# Patient Record
Sex: Female | Born: 1945 | Race: White | Hispanic: No | Marital: Married | Smoking: Former smoker
Health system: Southern US, Community
[De-identification: ages and names within clinical notes are randomized; demographics above are authoritative.]

## PROBLEM LIST (undated history)

## (undated) DIAGNOSIS — E669 Obesity, unspecified: Secondary | ICD-10-CM

## (undated) DIAGNOSIS — K766 Portal hypertension: Secondary | ICD-10-CM

## (undated) DIAGNOSIS — H4020X0 Unspecified primary angle-closure glaucoma, stage unspecified: Secondary | ICD-10-CM

## (undated) DIAGNOSIS — R350 Frequency of micturition: Secondary | ICD-10-CM

## (undated) DIAGNOSIS — I10 Essential (primary) hypertension: Secondary | ICD-10-CM

## (undated) DIAGNOSIS — Z9889 Other specified postprocedural states: Secondary | ICD-10-CM

## (undated) DIAGNOSIS — Z85828 Personal history of other malignant neoplasm of skin: Secondary | ICD-10-CM

## (undated) DIAGNOSIS — R202 Paresthesia of skin: Secondary | ICD-10-CM

## (undated) DIAGNOSIS — Z8489 Family history of other specified conditions: Secondary | ICD-10-CM

## (undated) DIAGNOSIS — Z923 Personal history of irradiation: Secondary | ICD-10-CM

## (undated) DIAGNOSIS — G2581 Restless legs syndrome: Secondary | ICD-10-CM

## (undated) DIAGNOSIS — R112 Nausea with vomiting, unspecified: Secondary | ICD-10-CM

## (undated) DIAGNOSIS — T8859XA Other complications of anesthesia, initial encounter: Secondary | ICD-10-CM

## (undated) DIAGNOSIS — K7460 Unspecified cirrhosis of liver: Secondary | ICD-10-CM

## (undated) DIAGNOSIS — I214 Non-ST elevation (NSTEMI) myocardial infarction: Secondary | ICD-10-CM

## (undated) DIAGNOSIS — C569 Malignant neoplasm of unspecified ovary: Secondary | ICD-10-CM

## (undated) DIAGNOSIS — N3281 Overactive bladder: Secondary | ICD-10-CM

## (undated) DIAGNOSIS — T4145XA Adverse effect of unspecified anesthetic, initial encounter: Secondary | ICD-10-CM

## (undated) DIAGNOSIS — I509 Heart failure, unspecified: Secondary | ICD-10-CM

## (undated) DIAGNOSIS — R945 Abnormal results of liver function studies: Secondary | ICD-10-CM

## (undated) DIAGNOSIS — R7989 Other specified abnormal findings of blood chemistry: Secondary | ICD-10-CM

## (undated) HISTORY — DX: Unspecified cirrhosis of liver: K74.60

## (undated) HISTORY — DX: Abnormal results of liver function studies: R94.5

## (undated) HISTORY — DX: Overactive bladder: N32.81

## (undated) HISTORY — PX: DENTAL SURGERY: SHX609

## (undated) HISTORY — DX: Obesity, unspecified: E66.9

## (undated) HISTORY — DX: Non-ST elevation (NSTEMI) myocardial infarction: I21.4

## (undated) HISTORY — DX: Unspecified primary angle-closure glaucoma, stage unspecified: H40.20X0

## (undated) HISTORY — PX: TUBAL LIGATION: SHX77

## (undated) HISTORY — DX: Essential (primary) hypertension: I10

## (undated) HISTORY — PX: EYE SURGERY: SHX253

## (undated) HISTORY — DX: Other specified abnormal findings of blood chemistry: R79.89

## (undated) HISTORY — DX: Portal hypertension: K76.6

## (undated) HISTORY — DX: Malignant neoplasm of unspecified ovary: C56.9

---

## 1969-02-11 HISTORY — PX: TONSILLECTOMY: SUR1361

## 1986-02-11 HISTORY — PX: PARTIAL HYSTERECTOMY: SHX80

## 1999-03-22 ENCOUNTER — Other Ambulatory Visit: Admission: RE | Admit: 1999-03-22 | Discharge: 1999-03-22 | Payer: Self-pay | Admitting: Obstetrics and Gynecology

## 2000-06-09 ENCOUNTER — Other Ambulatory Visit: Admission: RE | Admit: 2000-06-09 | Discharge: 2000-06-09 | Payer: Self-pay | Admitting: Obstetrics and Gynecology

## 2001-02-11 HISTORY — PX: COSMETIC SURGERY: SHX468

## 2001-03-16 ENCOUNTER — Encounter: Admission: RE | Admit: 2001-03-16 | Discharge: 2001-03-16 | Payer: Self-pay | Admitting: Specialist

## 2001-03-16 ENCOUNTER — Encounter: Payer: Self-pay | Admitting: Specialist

## 2001-08-11 ENCOUNTER — Other Ambulatory Visit: Admission: RE | Admit: 2001-08-11 | Discharge: 2001-08-11 | Payer: Self-pay | Admitting: Obstetrics and Gynecology

## 2002-09-07 ENCOUNTER — Other Ambulatory Visit: Admission: RE | Admit: 2002-09-07 | Discharge: 2002-09-07 | Payer: Self-pay | Admitting: Obstetrics and Gynecology

## 2003-11-18 ENCOUNTER — Emergency Department (HOSPITAL_COMMUNITY): Admission: EM | Admit: 2003-11-18 | Discharge: 2003-11-18 | Payer: Self-pay | Admitting: Family Medicine

## 2005-04-10 ENCOUNTER — Encounter: Admission: RE | Admit: 2005-04-10 | Discharge: 2005-04-10 | Payer: Self-pay | Admitting: Orthopedic Surgery

## 2005-10-18 HISTORY — PX: COLONOSCOPY: SHX174

## 2009-11-24 HISTORY — PX: WH-MAMMOGRAPHY: HXRAD724

## 2010-10-13 DIAGNOSIS — I214 Non-ST elevation (NSTEMI) myocardial infarction: Secondary | ICD-10-CM

## 2010-10-13 HISTORY — PX: OTHER SURGICAL HISTORY: SHX169

## 2010-10-13 HISTORY — DX: Non-ST elevation (NSTEMI) myocardial infarction: I21.4

## 2010-10-24 ENCOUNTER — Inpatient Hospital Stay (HOSPITAL_COMMUNITY)
Admission: EM | Admit: 2010-10-24 | Discharge: 2010-10-27 | DRG: 125 | Disposition: A | Discharge status: Home / Self Care | Payer: BC Managed Care – PPO | Source: Ambulatory Visit | Attending: Cardiovascular Disease | Admitting: Cardiovascular Disease

## 2010-10-24 ENCOUNTER — Emergency Department (HOSPITAL_COMMUNITY): Payer: BC Managed Care – PPO

## 2010-10-24 DIAGNOSIS — J45909 Unspecified asthma, uncomplicated: Secondary | ICD-10-CM | POA: Diagnosis present

## 2010-10-24 DIAGNOSIS — R079 Chest pain, unspecified: Secondary | ICD-10-CM

## 2010-10-24 DIAGNOSIS — K219 Gastro-esophageal reflux disease without esophagitis: Secondary | ICD-10-CM | POA: Diagnosis present

## 2010-10-24 DIAGNOSIS — Z7982 Long term (current) use of aspirin: Secondary | ICD-10-CM

## 2010-10-24 DIAGNOSIS — R0789 Other chest pain: Principal | ICD-10-CM | POA: Diagnosis present

## 2010-10-24 DIAGNOSIS — R799 Abnormal finding of blood chemistry, unspecified: Secondary | ICD-10-CM | POA: Diagnosis present

## 2010-10-24 DIAGNOSIS — I10 Essential (primary) hypertension: Secondary | ICD-10-CM | POA: Diagnosis present

## 2010-10-24 DIAGNOSIS — N318 Other neuromuscular dysfunction of bladder: Secondary | ICD-10-CM | POA: Diagnosis present

## 2010-10-24 DIAGNOSIS — E119 Type 2 diabetes mellitus without complications: Secondary | ICD-10-CM | POA: Diagnosis present

## 2010-10-24 LAB — CBC
HCT: 39.2 % (ref 36.0–46.0)
HCT: 37.3 % (ref 36.0–46.0)
Hemoglobin: 13.7 g/dL (ref 12.0–15.0)
Hemoglobin: 12.9 g/dL (ref 12.0–15.0)
MCH: 31.5 pg (ref 26.0–34.0)
MCH: 31 pg (ref 26.0–34.0)
MCHC: 34.9 g/dL (ref 30.0–36.0)
MCHC: 34.6 g/dL (ref 30.0–36.0)
MCV: 90.1 fL (ref 78.0–100.0)
MCV: 89.7 fL (ref 78.0–100.0)
Platelets: 164 10*3/uL (ref 150–400)
Platelets: 161 10*3/uL (ref 150–400)
RBC: 4.35 MIL/uL (ref 3.87–5.11)
RBC: 4.16 MIL/uL (ref 3.87–5.11)
RDW: 14.6 % (ref 11.5–15.5)
RDW: 14.8 % (ref 11.5–15.5)
WBC: 8.4 10*3/uL (ref 4.0–10.5)
WBC: 9.8 10*3/uL (ref 4.0–10.5)

## 2010-10-24 LAB — CK TOTAL AND CKMB (NOT AT ARMC)
CK, MB: 3 ng/mL (ref 0.3–4.0)
Relative Index: INVALID (ref 0.0–2.5)
Total CK: 61 U/L (ref 7–177)

## 2010-10-24 LAB — POCT I-STAT, CHEM 8
BUN: 13 mg/dL (ref 6–23)
Calcium, Ion: 1.18 mmol/L (ref 1.12–1.32)
Chloride: 105 mEq/L (ref 96–112)
Creatinine, Ser: 0.7 mg/dL (ref 0.50–1.10)
Glucose, Bld: 127 mg/dL — ABNORMAL HIGH (ref 70–99)
HCT: 43 % (ref 36.0–46.0)
Hemoglobin: 14.6 g/dL (ref 12.0–15.0)
Potassium: 3.8 mEq/L (ref 3.5–5.1)
Sodium: 143 mEq/L (ref 135–145)
TCO2: 24 mmol/L (ref 0–100)

## 2010-10-24 LAB — HEPATIC FUNCTION PANEL
ALT: 170 U/L — ABNORMAL HIGH (ref 0–35)
AST: 112 U/L — ABNORMAL HIGH (ref 0–37)
Albumin: 3.9 g/dL (ref 3.5–5.2)
Alkaline Phosphatase: 122 U/L — ABNORMAL HIGH (ref 39–117)
Bilirubin, Direct: 0.2 mg/dL (ref 0.0–0.3)
Indirect Bilirubin: 0.3 mg/dL (ref 0.3–0.9)
Total Bilirubin: 0.5 mg/dL (ref 0.3–1.2)
Total Protein: 7.5 g/dL (ref 6.0–8.3)

## 2010-10-24 LAB — DIFFERENTIAL
Basophils Absolute: 0 10*3/uL (ref 0.0–0.1)
Basophils Relative: 0 % (ref 0–1)
Eosinophils Absolute: 0.1 10*3/uL (ref 0.0–0.7)
Eosinophils Relative: 1 % (ref 0–5)
Lymphocytes Relative: 20 % (ref 12–46)
Lymphs Abs: 1.6 10*3/uL (ref 0.7–4.0)
Monocytes Absolute: 0.5 10*3/uL (ref 0.1–1.0)
Monocytes Relative: 6 % (ref 3–12)
Neutro Abs: 6.1 10*3/uL (ref 1.7–7.7)
Neutrophils Relative %: 73 % (ref 43–77)

## 2010-10-24 LAB — CARDIAC PANEL(CRET KIN+CKTOT+MB+TROPI)
CK, MB: 4.2 ng/mL — ABNORMAL HIGH (ref 0.3–4.0)
Relative Index: INVALID (ref 0.0–2.5)
Total CK: 61 U/L (ref 7–177)
Troponin I: 0.89 ng/mL (ref <0.30)

## 2010-10-24 LAB — MRSA PCR SCREENING: MRSA by PCR: NEGATIVE

## 2010-10-24 LAB — HEPARIN LEVEL (UNFRACTIONATED): Heparin Unfractionated: 0.21 IU/mL — ABNORMAL LOW (ref 0.30–0.70)

## 2010-10-24 LAB — POCT I-STAT TROPONIN I: Troponin i, poc: 0.24 ng/mL (ref 0.00–0.08)

## 2010-10-24 LAB — GLUCOSE, CAPILLARY: Glucose-Capillary: 135 mg/dL — ABNORMAL HIGH (ref 70–99)

## 2010-10-24 LAB — PROTIME-INR
INR: 1.12 (ref 0.00–1.49)
Prothrombin Time: 14.6 seconds (ref 11.6–15.2)

## 2010-10-24 LAB — APTT: aPTT: 34 seconds (ref 24–37)

## 2010-10-24 LAB — TROPONIN I: Troponin I: 0.47 ng/mL (ref <0.30)

## 2010-10-25 HISTORY — PX: CARDIAC CATHETERIZATION: SHX172

## 2010-10-25 LAB — COMPREHENSIVE METABOLIC PANEL
ALT: 165 U/L — ABNORMAL HIGH (ref 0–35)
AST: 123 U/L — ABNORMAL HIGH (ref 0–37)
Albumin: 3.5 g/dL (ref 3.5–5.2)
Alkaline Phosphatase: 100 U/L (ref 39–117)
BUN: 12 mg/dL (ref 6–23)
CO2: 26 mEq/L (ref 19–32)
Calcium: 9.4 mg/dL (ref 8.4–10.5)
Chloride: 106 mEq/L (ref 96–112)
Creatinine, Ser: 0.73 mg/dL (ref 0.50–1.10)
GFR calc Af Amer: >60 mL/min (ref >60)
GFR calc non Af Amer: >60 mL/min (ref >60)
Glucose, Bld: 146 mg/dL — ABNORMAL HIGH (ref 70–99)
Potassium: 4.2 mEq/L (ref 3.5–5.1)
Sodium: 142 mEq/L (ref 135–145)
Total Bilirubin: 0.5 mg/dL (ref 0.3–1.2)
Total Protein: 6.9 g/dL (ref 6.0–8.3)

## 2010-10-25 LAB — D-DIMER, QUANTITATIVE (NOT AT ARMC): D-Dimer, Quant: <0.22 ug/mL-FEU (ref 0.00–0.48)

## 2010-10-25 LAB — GLUCOSE, CAPILLARY
Glucose-Capillary: 131 mg/dL — ABNORMAL HIGH (ref 70–99)
Glucose-Capillary: 189 mg/dL — ABNORMAL HIGH (ref 70–99)
Glucose-Capillary: 197 mg/dL — ABNORMAL HIGH (ref 70–99)
Glucose-Capillary: 198 mg/dL — ABNORMAL HIGH (ref 70–99)

## 2010-10-25 LAB — CBC
HCT: 38.1 % (ref 36.0–46.0)
Hemoglobin: 12.9 g/dL (ref 12.0–15.0)
MCH: 30.8 pg (ref 26.0–34.0)
MCHC: 33.9 g/dL (ref 30.0–36.0)
MCV: 90.9 fL (ref 78.0–100.0)
Platelets: 153 10*3/uL (ref 150–400)
RBC: 4.19 MIL/uL (ref 3.87–5.11)
RDW: 15.1 % (ref 11.5–15.5)
WBC: 7.7 10*3/uL (ref 4.0–10.5)

## 2010-10-25 LAB — LIPID PANEL
Cholesterol: 145 mg/dL (ref 0–200)
HDL: 43 mg/dL (ref >39)
LDL Cholesterol: 75 mg/dL (ref 0–99)
Total CHOL/HDL Ratio: 3.4 RATIO
Triglycerides: 135 mg/dL (ref <150)
VLDL: 27 mg/dL (ref 0–40)

## 2010-10-25 LAB — TSH: TSH: 3.432 u[IU]/mL (ref 0.350–4.500)

## 2010-10-25 LAB — POCT ACTIVATED CLOTTING TIME: Activated Clotting Time: 138 seconds

## 2010-10-25 LAB — CARDIAC PANEL(CRET KIN+CKTOT+MB+TROPI)
CK, MB: 3.9 ng/mL (ref 0.3–4.0)
Relative Index: INVALID (ref 0.0–2.5)
Total CK: 55 U/L (ref 7–177)
Troponin I: 0.3 ng/mL (ref <0.30)

## 2010-10-25 LAB — HEPARIN LEVEL (UNFRACTIONATED): Heparin Unfractionated: 0.27 IU/mL — ABNORMAL LOW (ref 0.30–0.70)

## 2010-10-26 ENCOUNTER — Inpatient Hospital Stay (HOSPITAL_COMMUNITY): Payer: BC Managed Care – PPO

## 2010-10-26 LAB — CBC
HCT: 40.3 % (ref 36.0–46.0)
Hemoglobin: 13.4 g/dL (ref 12.0–15.0)
MCH: 30.5 pg (ref 26.0–34.0)
MCHC: 33.3 g/dL (ref 30.0–36.0)
MCV: 91.6 fL (ref 78.0–100.0)
Platelets: 188 10*3/uL (ref 150–400)
RBC: 4.4 MIL/uL (ref 3.87–5.11)
RDW: 15.1 % (ref 11.5–15.5)
WBC: 10.2 10*3/uL (ref 4.0–10.5)

## 2010-10-26 LAB — BASIC METABOLIC PANEL
BUN: 15 mg/dL (ref 6–23)
CO2: 28 mEq/L (ref 19–32)
Calcium: 9.7 mg/dL (ref 8.4–10.5)
Chloride: 107 mEq/L (ref 96–112)
Creatinine, Ser: 0.75 mg/dL (ref 0.50–1.10)
GFR calc Af Amer: >60 mL/min (ref >60)
GFR calc non Af Amer: >60 mL/min (ref >60)
Glucose, Bld: 118 mg/dL — ABNORMAL HIGH (ref 70–99)
Potassium: 3.8 mEq/L (ref 3.5–5.1)
Sodium: 143 mEq/L (ref 135–145)

## 2010-10-26 LAB — GLUCOSE, CAPILLARY
Glucose-Capillary: 127 mg/dL — ABNORMAL HIGH (ref 70–99)
Glucose-Capillary: 111 mg/dL — ABNORMAL HIGH (ref 70–99)
Glucose-Capillary: 218 mg/dL — ABNORMAL HIGH (ref 70–99)
Glucose-Capillary: 113 mg/dL — ABNORMAL HIGH (ref 70–99)

## 2010-10-26 LAB — CARDIAC PANEL(CRET KIN+CKTOT+MB+TROPI)
CK, MB: 2.4 ng/mL (ref 0.3–4.0)
Relative Index: INVALID (ref 0.0–2.5)
Total CK: 68 U/L (ref 7–177)
Troponin I: <0.3 ng/mL (ref <0.30)

## 2010-10-27 LAB — CBC
HCT: 38.2 % (ref 36.0–46.0)
Hemoglobin: 12.8 g/dL (ref 12.0–15.0)
MCH: 30.7 pg (ref 26.0–34.0)
MCHC: 33.5 g/dL (ref 30.0–36.0)
MCV: 91.6 fL (ref 78.0–100.0)
Platelets: 156 10*3/uL (ref 150–400)
RBC: 4.17 MIL/uL (ref 3.87–5.11)
RDW: 15.2 % (ref 11.5–15.5)
WBC: 7 10*3/uL (ref 4.0–10.5)

## 2010-10-27 LAB — HEPATIC FUNCTION PANEL
ALT: 179 U/L — ABNORMAL HIGH (ref 0–35)
AST: 113 U/L — ABNORMAL HIGH (ref 0–37)
Albumin: 3.4 g/dL — ABNORMAL LOW (ref 3.5–5.2)
Alkaline Phosphatase: 97 U/L (ref 39–117)
Bilirubin, Direct: 0.1 mg/dL (ref 0.0–0.3)
Indirect Bilirubin: 0.3 mg/dL (ref 0.3–0.9)
Total Bilirubin: 0.4 mg/dL (ref 0.3–1.2)
Total Protein: 6.7 g/dL (ref 6.0–8.3)

## 2010-10-27 LAB — GLUCOSE, CAPILLARY: Glucose-Capillary: 134 mg/dL — ABNORMAL HIGH (ref 70–99)

## 2010-10-29 NOTE — H&P (Addendum)
NAMELynnell Benson NO.:  [id number]  MEDICAL RECORD NO.:  [id number]  LOCATION:  2918                         FACILITY:  [facility]  PHYSICIAN:  Thayer Headings, M.D. DATE OF BIRTH:  Mar 26, 1945  DATE OF ADMISSION:  10/24/2010 DATE OF DISCHARGE:                             HISTORY & PHYSICAL   PRIMARY CARDIOLOGIST:  New to [facility].  CHIEF COMPLAINT:  Chest pain in the setting of an NSTEMI.  HISTORY OF PRESENT ILLNESS:  [name] is a 65 year old female with no prior cardiac history, but history of diabetes and asthma, who presented to [facility] with complaints of chest pain.  While taking the clothes out of the dryer today, she developed substernal chest pressure like an elephant sitting on her chest radiating to her jaw and both arms, associated with diaphoresis and nausea.  She sat down and took as aspirin, but with little relief.  She called her sister who advised herto go to [facility], and from there, she was referred to the ER.  She received five sublingual nitroglycerins and totalled with a third one really helping to decrease her pain.  She feels much better than when she came in, and states that she does have a soft residual soreness substernally which is different than her original symptoms.  Initial point-of-care troponin is 0.24 with next full set showing a troponin of 0.47 with negative CKs and MBs.  She has been initiated on IV heparin and has been started on nitroglycerin drip by the ER.  EKG shows normal sinus rhythm without significant ST-T changes including T-wave inversion in aVL.  PAST MEDICAL HISTORY: 1. Diabetes mellitus diagnosed in July 2012. 2. Overactive bladder. 3. Asthma, not requiring any long term maintenance therapy.  SURGICAL HISTORY:  Hysterectomy and tonsillectomy.  MEDICATIONS:  The patient is not entirely sure from med list, but now she takes metformin 500 mg b.i.d., losartan 25 mg daily,  Protonix, uncertain dose, Detrol, uncertain dose and she only takes this sporadically, and an albuterol inhaler once in awhile and Cenestin hormones, although she does not know what they contained.  ALLERGIES:  PENICILLIN caused a rash.  SOCIAL HISTORY:  [name] lives in [place].  She is single and has no living children.  She quit smoking 30 years ago, and otherwise smoked for about 2 years.  She drinks one beer week and denies any illicit drug use.  She does not work.  FAMILY HISTORY:  Mother is living at 41 with diabetes and asthma. Father died at 97 of bladder cancer and also had a CVA at age 62 with both hypertension.  There are six siblings in total, two of her sisters are accompanying her today during the interview of her consent.  There is a history of hypertension in her siblings, but no coronary artery disease.  REVIEW OF SYSTEMS:  No fevers, chills, vomiting, diarrhea, bright red blood per rectum, melena, hematemesis.  No hematuria.  She does have occasional low-grade dyspnea on exertion, which she relates to asthma sporadically.  However, she has mostly been able to do her activities of daily living without any interference including walking up the hill probably  may lax to her health.  All other were systems reviewed and otherwise negative.  LABORATORY LATA:  WBC 8.4, hemoglobin 14.6, hematocrit 43, platelet count 164.  Sodium 143, potassium 3.8, chloride 105, glucose 127, BUN 13, creatinine 0.7.  First point-of-care troponin was 0.24.  Next set of cardiac markers showed CK of 61, MB of 3, and troponin of 0.47.  RADIOLOGIC STUDIES:  Chest x-ray showed bibasilar linear scarring without active lung disease.  PHYSICAL EXAMINATION:  VITAL SIGNS:  Temperature 97.3, pulse 75, respirations 10, blood pressure 110/50, pulse ox 98% on room air. GENERAL:  This is an anxious-appearing white female in no acute distress. HEENT:  Normocephalic and atraumatic with extraocular  movements intact. Clear sclerae.  Nares are without discharge. NECK:  Supple without carotid bruits or JVD. HEART:  Auscultation of the heart reveals regular rate and rhythm with S1, S2 without murmurs, rubs, or gallops. LUNGS:  Clear to auscultation bilaterally without wheezes, rales or rhonchi. ABDOMEN:  Soft, nontender, and nondistended with positive bowel sounds. EXTREMITIES:  Warm and dry without edema.  She has 1+ pedal pulses bilaterally. NEUROLOGIC:  She is alert and oriented x3.  Responds to question appropriately with an unhappy-appearing mood.  ASSESSMENT AND PLAN:  The patient was seen and examined by Dr. Acie Fredrickson and myself.  This is a 65 year old female with no prior cardiac history, but a history of diabetes and asthma who presents with chest pain worrisome for acute coronary syndrome who had ruled in for an non-ST segment elevation myocardial infarction.  EKG shows nonspecific ST-T changes, the patient's pain has been abated with nitroglycerin.  We agree with the ER starting a nitroglycerin drip and IV heparin.  We will add aspirin daily.  She already took that at home today.  On followup labs, her LFTs are somewhat elevated, which may be related to her myocardial infarction, we will follow.  Now, we will add a statin, but overall LFTs remain elevated, this will need to be held.  We will add low-dose metoprolol given her asthma, and cold for any active wheezing which she has none of which now.  She will be set up for cardiac catheterization tomorrow to assess her coronary anatomy.  We will hold her metformin and continue her ACE inhibitor as well as sliding-scale insulin.  The plan was discussed with the patient and her family who are in agreement.     Melina Copa, P.A.C.   ______________________________ Thayer Headings, M.D.    DD/MEDQ  D:  10/24/2010  T:  10/25/2010  Job:  [id number]  Electronically Signed by Mertie Moores M.D. on 10/29/2010 05:36:36  AM Electronically Signed by Melina Copa  on 11/08/2010 07:04:12 PM

## 2010-11-01 ENCOUNTER — Telehealth: Payer: Self-pay | Admitting: Cardiovascular Disease

## 2010-11-01 ENCOUNTER — Telehealth: Payer: Self-pay | Admitting: *Deleted

## 2010-11-01 NOTE — Telephone Encounter (Signed)
Patient calling back speak with nurse following her d.c from hosptial.

## 2010-11-01 NOTE — Telephone Encounter (Signed)
Appointment scheduled by [facility]

## 2010-11-01 NOTE — Telephone Encounter (Signed)
Post hosp establish, pt doing well after LHC, made app in one week and to call if any questions or concerns

## 2010-11-06 ENCOUNTER — Ambulatory Visit (INDEPENDENT_AMBULATORY_CARE_PROVIDER_SITE_OTHER): Payer: BC Managed Care – PPO | Admitting: Nurse Practitioner

## 2010-11-06 ENCOUNTER — Encounter: Payer: Self-pay | Admitting: Nurse Practitioner

## 2010-11-06 VITALS — BP 136/76 | HR 64 | Ht 62.0 in | Wt 194.2 lb

## 2010-11-06 DIAGNOSIS — R7989 Other specified abnormal findings of blood chemistry: Secondary | ICD-10-CM

## 2010-11-06 DIAGNOSIS — Z9889 Other specified postprocedural states: Secondary | ICD-10-CM | POA: Insufficient documentation

## 2010-11-06 LAB — HEPATIC FUNCTION PANEL
ALT: 131 U/L — ABNORMAL HIGH (ref 0–35)
AST: 83 U/L — ABNORMAL HIGH (ref 0–37)
Albumin: 4.2 g/dL (ref 3.5–5.2)
Alkaline Phosphatase: 134 U/L — ABNORMAL HIGH (ref 39–117)
Bilirubin, Direct: 0.1 mg/dL (ref 0.0–0.3)
Total Bilirubin: 0.6 mg/dL (ref 0.3–1.2)
Total Protein: 7.8 g/dL (ref 6.0–8.3)

## 2010-11-06 LAB — BASIC METABOLIC PANEL
BUN: 13 mg/dL (ref 6–23)
CO2: 27 mEq/L (ref 19–32)
Calcium: 9.9 mg/dL (ref 8.4–10.5)
Chloride: 107 mEq/L (ref 96–112)
Creatinine, Ser: 0.8 mg/dL (ref 0.4–1.2)
GFR: 72.76 mL/min (ref >60.00)
Glucose, Bld: 118 mg/dL — ABNORMAL HIGH (ref 70–99)
Potassium: 5.3 mEq/L — ABNORMAL HIGH (ref 3.5–5.1)
Sodium: 144 mEq/L (ref 135–145)

## 2010-11-06 NOTE — Assessment & Plan Note (Signed)
She is now doing well. Her story of chest pain is very concerning. Fortunately, her coronaries are normal. She has had no recurrence of symptoms. We discussed need for exercise and weight loss today. We will recheck a CMET today. Further management per her PCP. We will be available if problems arise in the future. Patient is agreeable to this plan and will call if any problems develop in the interim.

## 2010-11-06 NOTE — Progress Notes (Signed)
    Olivia Benson Date of Birth: 04/21/1945   History of Present Illness: Olivia Benson is seen today for a post hospital visit. She had recent episode of chest pain with radiation into the neck and jaw. Her cath was normal. LFTs were elevated and need recheck today. Abdominal ultrasound was normal except for a fatty liver. She has had no recurrence. She feels well. No problem with her groin. She does not exercise and is overweight which we discussed in detail today.   Current Outpatient Prescriptions on File Prior to Visit  Medication Sig Dispense Refill  . albuterol (PROVENTIL HFA;VENTOLIN HFA) 108 (90 BASE) MCG/ACT inhaler Inhale 2 puffs into the lungs every 4 (four) hours as needed.        Marland Kitchen aspirin 81 MG tablet Take 81 mg by mouth daily.        Marland Kitchen estrogens conjugated, synthetic A, (CENESTIN) 0.625 MG tablet Take 0.625 mg by mouth daily.        Marland Kitchen losartan (COZAAR) 25 MG tablet Take 25 mg by mouth daily.        . metFORMIN (GLUCOPHAGE) 500 MG tablet Take 500 mg by mouth 2 (two) times daily with a meal.        . metoprolol succinate (TOPROL-XL) 25 MG 24 hr tablet Take 25 mg by mouth daily.        . pantoprazole (PROTONIX) 40 MG tablet Take 40 mg by mouth daily.          Allergies  Allergen Reactions  . Penicillins Rash    Past Medical History  Diagnosis Date  . Diabetes mellitus   . Asthma   . Overactive bladder   . Non-ST elevated myocardial infarction (non-STEMI) Sept 2012    Mild elevation in troponin and CK MB but no evidence of coronary disease at time of cath  . Elevated LFTs   . Obesity     Past Surgical History  Procedure Date  . Tonsillectomy   . Abdominal ultrasound Sept 2012    No gallbladder disease, + fatty liver  . Cardiac catheterization 10/25/2010    EF 60%; Normal coronaries  . Colonoscopy 10/18/2005  . Wh-mammography 11/24/2009    History  Smoking status  . Former Smoker  . Quit date: 11/05/1980  Smokeless tobacco  . Not on file    History  Alcohol Use No      Family History  Problem Relation Age of Onset  . Asthma Mother   . Diabetes Mother   . Hypertension Mother   . Cancer Father   . Stroke Father   . Hypertension Father     Review of Systems: The review of systems is as above.  All other systems were reviewed and are negative.  Physical Exam: BP 136/76  Pulse 64  Ht 5' 2"  (1.575 m)  Wt 194 lb 3.2 oz (88.089 kg)  BMI 35.52 kg/m2 Patient is very pleasant and in no acute distress. She is obese. Skin is warm and dry. Color is normal.  HEENT is unremarkable. Normocephalic/atraumatic. PERRL. Sclera are nonicteric. Neck is supple. No masses. No JVD. Lungs are clear. Cardiac exam shows a regular rate and rhythm. Abdomen is obese but soft. Extremities are without edema. Gait and ROM are intact. No gross neurologic deficits noted.  LABORATORY DATA:   Assessment / Plan:

## 2010-11-06 NOTE — Patient Instructions (Signed)
Exercise and weight loss are encouraged. We will be available as needed. We are going to recheck your liver tests.

## 2010-11-21 NOTE — Discharge Summary (Signed)
NAMELynnell Benson NO.:  [id number]  MEDICAL RECORD NO.:  [id number]  LOCATION:  2025                         FACILITY:  [facility]  PHYSICIAN:  Darlin Coco, M.D. DATE OF BIRTH:  03/10/1945  DATE OF ADMISSION:  10/24/2010 DATE OF DISCHARGE:  10/27/2010                              DISCHARGE SUMMARY   PROCEDURES: 1. Cardiac catheterization. 2. Coronary arteriogram. 3. Left ventriculogram. 4. Ultrasound of the abdomen, complete. 5. Chest x-ray.  PRIMARY FINAL DISCHARGE DIAGNOSIS:  Chest pain with an elevation in troponin to 0.89 and a CK-MB to 4.2, but no significant coronary artery disease on cath.  SECONDARY DIAGNOSES: 1. Diabetes. 2. Asthma. 3. History of overactive bladder. 4. Status post hysterectomy and tonsillectomy. 5. Status post abdominal ultrasound showing normal gallbladder and     likely fatty infiltration of her liver.  TIME AT DISCHARGE:  34 minutes.  HOSPITAL COURSE:  [name] is a 65 year old female with cardiac risk factors, but no history of coronary artery disease.  She had chest pain and came to the hospital where she was admitted for further evaluation and treatment.  She had some elevation in her cardiac enzymes.  She was taken to the cath lab because of concern for non-ST segment elevation MI.  She had no significant coronary artery disease and her EF was normal.  A D-dimer was checked and was within normal limits at less than 0.22.  An ultrasound of the abdomen was done and as described above.  Her enzymes trended down and normalized.  A lipid profile showed an HDL of 43 and an LDL of 75.  A TSH was within normal limits as well.  She had elevation in her liver function tests with an initial alkaline phosphatase of 122 that normalized.  Her SGOT was elevated at 112 with an SGPT of 170. These had no significant change during her hospital stay.  Her albumin was minimally low at 3.4, but her bilirubin was within normal  limits. On October 27, 2010, Olivia Benson was ambulating without chest pain or shortness of breath.  She was evaluated by Dr. Mare Ferrari and considered stable for discharge, to follow up as an outpatient.  DISCHARGE INSTRUCTIONS:  Her activity level is to be increased gradually with no driving for 2 days and no lifting or sexual activity for a week. She is encouraged to stick to a low-sodium diabetic diet.  She is to follow up with Dr. Acie Fredrickson and our office will call her.  She should get a CMET at that appointment to follow up her LFTs.  She is to follow up with primary care as well.  DISCHARGE MEDICATIONS: 1. Losartan 25 mg nightly. 2. Toprol-XL 25 mg daily. 3. Metformin 500 mg b.i.d., hold for 2 days and restart on October 29, 2010. 4. Aspirin 81 mg a day. 5. Protonix 40 mg nightly. 6. Albuterol 90 mcg q.4 h p.r.n. 7. Cenestin estrogen 0.65 mg nightly.     Rosaria Ferries, PA-C   ______________________________ Darlin Coco, M.D.    RB/MEDQ  D:  10/27/2010  T:  10/27/2010  Job:  [id number]  Electronically Signed by Rosaria Ferries PA-C on  11/19/2010 06:36:04 AM Electronically Signed by Darlin Coco M.D. on 11/21/2010 09:00:42 AM

## 2010-11-22 NOTE — Cardiovascular Report (Signed)
NAMELynnell Dike NO.:  [id number]  MEDICAL RECORD NO.:  [id number]  LOCATION:  2918                         FACILITY:  [facility]  PHYSICIAN:  Loretha Brasil. Lia Foyer, MD, FACCDATE OF BIRTH:  November 18, 1945  DATE OF PROCEDURE:  10/25/2010 DATE OF DISCHARGE:                           CARDIAC CATHETERIZATION   INDICATIONS:  [name] is a 65 year old who presents with some chest pain.  In addition, the patient had a borderline positive troponin. They were not definitely electrocardiographic abnormalities, as a result, the current study was done to assess her coronary anatomy.  She does have elevated liver functions, although she says that this has been in the past.  Risks, benefits and alternatives were discussed and the patient consented to proceed.  PROCEDURE: 1. Left heart catheterization. 2. Selective coronary arteriography. 3. Selective left ventriculography.  DESCRIPTION OF PROCEDURE:  The procedure was performed from the right femoral artery using 4-French catheters.  A smart needle was used to gain access.  She tolerated the procedure without complication was taken to the holding area in satisfactory clinical condition.  HEMODYNAMIC DATA: 1. The central aortic pressure is 141/79 with a mean of 107. 2. Left ventricular pressure 140/20. 3. There was no gradient or pullback across the aortic valve.  ANGIOGRAPHIC DATA: 1. Ventriculography done in the RAO projection revealed vigorous     global systolic function.  No segmental abnormalities or     contraction were identified. 2. The left main is free of critical disease. 3. The left anterior descending artery coursed to the apex.  In     looking at the films carefully, the LAD is a large-caliber vessel     that is fairly smooth throughout all the way to the apex where it     divides into two branches.  There is a small first diagonal, a     moderate-sized second diagonal, and a moderate-sized third  diagonal.  All these vessels appear to be without critical     narrowing. 4. The circumflex provides a marginal branch proximally and then a     second marginal branch both of which appear without critical     narrowing.  There maybe some mild luminal irregularities in the     small AV branch, but this does not appear to be high-grade by any     means. 5. The right coronary artery is also a large-caliber vessel that     appears free of critical disease.  There is a posterior descending     and AV nodal artery, then two large posterolateral branches both of     which are free of disease.  CONCLUSIONS: 1. Well-preserved left ventricular function with ejection fraction in     excess of 60%. 2. No significant areas of high-grade focal obstruction noted.  DISPOSITION:  At the present time, she will be treated medically with a marked elevation of liver functions.  We will do a gallbladder ultrasound, and also ultrasound of the liver to determine if she has a fatty liver.  A D-dimer will be obtained as well to exclude the likelihood of pulmonary emboli.     Loretha Brasil. Lia Foyer, MD,  [facility]     TDS/MEDQ  D:  10/25/2010  T:  10/25/2010  Job:  [id number]  cc:   CV Laboratory  Electronically Signed by Bing Quarry MD [facility] on 11/22/2010 05:42:58 AM

## 2011-08-28 ENCOUNTER — Encounter: Payer: Self-pay | Admitting: Gastroenterology

## 2011-09-04 ENCOUNTER — Other Ambulatory Visit (INDEPENDENT_AMBULATORY_CARE_PROVIDER_SITE_OTHER): Payer: BC Managed Care – PPO

## 2011-09-04 ENCOUNTER — Ambulatory Visit (INDEPENDENT_AMBULATORY_CARE_PROVIDER_SITE_OTHER): Payer: BC Managed Care – PPO | Admitting: Gastroenterology

## 2011-09-04 ENCOUNTER — Encounter: Payer: Self-pay | Admitting: Gastroenterology

## 2011-09-04 VITALS — BP 118/64 | HR 64 | Ht 62.0 in | Wt 195.4 lb

## 2011-09-04 DIAGNOSIS — K7689 Other specified diseases of liver: Secondary | ICD-10-CM

## 2011-09-04 DIAGNOSIS — R933 Abnormal findings on diagnostic imaging of other parts of digestive tract: Secondary | ICD-10-CM

## 2011-09-04 DIAGNOSIS — R7401 Elevation of levels of liver transaminase levels: Secondary | ICD-10-CM

## 2011-09-04 DIAGNOSIS — R7402 Elevation of levels of lactic acid dehydrogenase (LDH): Secondary | ICD-10-CM

## 2011-09-04 DIAGNOSIS — K760 Fatty (change of) liver, not elsewhere classified: Secondary | ICD-10-CM | POA: Insufficient documentation

## 2011-09-04 LAB — PROTIME-INR
INR: 1.2 ratio — ABNORMAL HIGH (ref 0.8–1.0)
Prothrombin Time: 12.9 s — ABNORMAL HIGH (ref 10.2–12.4)

## 2011-09-04 LAB — HEPATIC FUNCTION PANEL
ALT: 123 U/L — ABNORMAL HIGH (ref 0–35)
AST: 89 U/L — ABNORMAL HIGH (ref 0–37)
Albumin: 4.2 g/dL (ref 3.5–5.2)
Alkaline Phosphatase: 99 U/L (ref 39–117)
Bilirubin, Direct: 0.1 mg/dL (ref 0.0–0.3)
Total Bilirubin: 0.7 mg/dL (ref 0.3–1.2)
Total Protein: 7.9 g/dL (ref 6.0–8.3)

## 2011-09-04 LAB — BUN: BUN: 12 mg/dL (ref 6–23)

## 2011-09-04 NOTE — Progress Notes (Signed)
History of Present Illness: This is a 66 year old female elevated transaminases for the past several years. Her transaminases have been elevated for the past year ranging from an AST of 58 to 121 and ALT of 68 to 174 over the past year. Hepatitis A, B, and C serologies negative and ferritin was normal. She states that she started gaining weight about 8 or 9 years ago that she has been unable to lose weight long-term. She also has diabetes mellitus which has not been adequately controlled in the past. She states her recent hemoglobin A1c was 6.9. She drinks alcohol on only rare occasions. Does not relate any prior history of heavy alcohol usage. There is no prior history of jaundice or hepatitis. Abdominal ultrasound performed in September 2012 showed the following:  No evidence of cholelithiasis or cholecystitis.  Probable fatty infiltration of liver as above, though cirrhosis not  completely excluded with this appearance.  She has no gastrointestinal complaints. She states she underwent screening colonoscopy by Dr. Collene Mares 2007 that showed mild diverticulosis. The procedure was apparently difficult, possibly due to adhesions and she had abdominal discomfort following the procedure. She is reluctant to schedule another colonoscopy because of this. Denies weight loss, abdominal pain, constipation, diarrhea, change in stool caliber, melena, hematochezia, nausea, vomiting, dysphagia, reflux symptoms, chest pain.  Review of Systems: Pertinent positive and negative review of systems were noted in the above HPI section. All other review of systems were otherwise negative.  Current Medications, Allergies, Past Medical History, Past Surgical History, Family History and Social History were reviewed in [employer] record.  Physical Exam: General: Well developed , well nourished, no acute distress Head: Normocephalic and atraumatic Eyes:  sclerae anicteric, EOMI Ears: Normal auditory  acuity Mouth: No deformity or lesions Neck: Supple, no masses or thyromegaly Lungs: Clear throughout to auscultation Heart: Regular rate and rhythm; no murmurs, rubs or bruits Abdomen: Soft, non tender and non distended. No masses, hepatosplenomegaly or hernias noted. Normal Bowel sounds Musculoskeletal: Symmetrical with no gross deformities  Skin: No lesions on visible extremities Pulses:  Normal pulses noted Extremities: No clubbing, cyanosis, edema or deformities noted Neurological: Alert oriented x 4, grossly nonfocal Cervical Nodes:  No significant cervical adenopathy Inguinal Nodes: No significant inguinal adenopathy Psychological:  Alert and cooperative. Normal mood and affect  Assessment and Recommendations:  1. Elevated transaminases and abnormal hepatic imaging. She has fatty infiltration of the liver related to obesity and diabetes. Obtain serologies to rule out other liver diseases. CT scan of the abdomen to evaluate the abnormal hepatic contour noted on ultrasound that questioned early cirrhotic changes. Consider liver biopsy. She is advised to maintain a long-term low fat diet and weight loss program with close control of her diabetes mellitus all supervised by her primary physician. It is reasonable to monitor her LFTs every 6 months.  2. Colorectal cancer screening, average risk. Colonoscopy recommended 2017. Will request records from Dr. Collene Mares.

## 2011-09-04 NOTE — Patient Instructions (Signed)
Your physician has requested that you go to the basement for the following lab work before leaving today: You have been scheduled for a CT scan of the abdomen and pelvis at [facility] ([address]---this is in the same building as [facility]).   You are scheduled on 09/06/11 at 2:30pm. You should arrive 15 minutes prior to your appointment time for registration. Please follow the written instructions below on the day of your exam:  WARNING: IF YOU ARE ALLERGIC TO IODINE/X-RAY DYE, PLEASE NOTIFY RADIOLOGY IMMEDIATELY AT [phone]! YOU WILL BE GIVEN A 13 HOUR PREMEDICATION PREP.  1) Do not eat or drink anything after 10:30am (4 hours prior to your test) 2) You have been given 2 bottles of oral contrast to drink. The solution may taste better if refrigerated, but do NOT add ice or any other liquid to this solution. Shake well before drinking.    Drink 1 bottle of contrast @ 12:30pm (2 hours prior to your exam)  Drink 1 bottle of contrast @ 1:30pm (1 hour prior to your exam)  You may take any medications as prescribed with a small amount of water except for the following: Metformin, Glucophage, Glucovance, Avandamet, Riomet, Fortamet, Actoplus Met, Janumet, Glumetza or Metaglip. The above medications must be held the day of the exam AND 48 hours after the exam.  The purpose of you drinking the oral contrast is to aid in the visualization of your intestinal tract. The contrast solution may cause some diarrhea. Before your exam is started, you will be given a small amount of fluid to drink. Depending on your individual set of symptoms, you may also receive an intravenous injection of x-ray contrast/dye. Plan on being at [facility] for 30 minutes or long, depending on the type of exam you are having performed.  If you have any questions regarding your exam or if you need to reschedule, you may call the CT department at [phone] between the hours of 8:00 am and 5:00  pm, Monday-Friday.  ________________________________________________________________________  cc: Helane Rima, MD

## 2011-09-05 LAB — CERULOPLASMIN: Ceruloplasmin: 37 mg/dL (ref 20–60)

## 2011-09-05 LAB — ALPHA-1-ANTITRYPSIN: A-1 Antitrypsin, Ser: 199 mg/dL (ref 90–200)

## 2011-09-05 LAB — ANGIOTENSIN CONVERTING ENZYME: Angiotensin-Converting Enzyme: 66 U/L — ABNORMAL HIGH (ref 8–52)

## 2011-09-05 LAB — ANA: Anti Nuclear Antibody(ANA): NEGATIVE

## 2011-09-06 ENCOUNTER — Ambulatory Visit (INDEPENDENT_AMBULATORY_CARE_PROVIDER_SITE_OTHER)
Admission: RE | Admit: 2011-09-06 | Discharge: 2011-09-06 | Disposition: A | Discharge status: Home / Self Care | Payer: BC Managed Care – PPO | Source: Ambulatory Visit | Attending: Gastroenterology | Admitting: Gastroenterology

## 2011-09-06 DIAGNOSIS — R7401 Elevation of levels of liver transaminase levels: Secondary | ICD-10-CM

## 2011-09-06 DIAGNOSIS — R933 Abnormal findings on diagnostic imaging of other parts of digestive tract: Secondary | ICD-10-CM

## 2011-09-06 LAB — CREATININE, SERUM
Creat: 0.73 mg/dL (ref 0.50–1.10)
Creatinine, Ser: 1 mg/dL (ref 0.4–1.2)

## 2011-09-06 LAB — MITOCHONDRIAL ANTIBODIES: Mitochondrial M2 Ab, IgG: 0.26 (ref <0.91)

## 2011-09-06 LAB — ANTI-SMOOTH MUSCLE ANTIBODY, IGG: Smooth Muscle Ab: 8 U (ref <20)

## 2011-09-06 MED ORDER — IOHEXOL 300 MG/ML  SOLN
100.0000 mL | Freq: Once | INTRAMUSCULAR | Status: AC | PRN
  Administered 2011-09-06: 100 mL via INTRAVENOUS

## 2011-10-07 ENCOUNTER — Ambulatory Visit (INDEPENDENT_AMBULATORY_CARE_PROVIDER_SITE_OTHER): Payer: BC Managed Care – PPO | Admitting: Gastroenterology

## 2011-10-07 ENCOUNTER — Encounter: Payer: Self-pay | Admitting: Gastroenterology

## 2011-10-07 VITALS — BP 132/70 | HR 68 | Ht 62.0 in | Wt 197.8 lb

## 2011-10-07 DIAGNOSIS — R933 Abnormal findings on diagnostic imaging of other parts of digestive tract: Secondary | ICD-10-CM

## 2011-10-07 DIAGNOSIS — R7401 Elevation of levels of liver transaminase levels: Secondary | ICD-10-CM

## 2011-10-07 NOTE — Patient Instructions (Addendum)
You have been given a low fat diet to be monitored by your Primary Care Physician. cc: Helane Rima, MD

## 2011-10-07 NOTE — Progress Notes (Signed)
History of Present Illness: This is a 66 year old female returning for follow up of an abnormal CT scan. All hepatic serologies were negative except for a minimally elevated ACE level. 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   CT findings consistent with cirrhotic changes involving the  liver. No worrisome liver mass. There are associated portal  venous collaterals and splenomegaly consistent with portal venous  hypertension.  Current Medications, Allergies, Past Medical History, Past Surgical History, Family History and Social History were reviewed in [employer] record.  Physical Exam: General: Well developed , well nourished, obese, no acute distress Head: Normocephalic and atraumatic Eyes:  sclerae anicteric, EOMI Ears: Normal auditory acuity Mouth: No deformity or lesions Lungs: Clear throughout to auscultation Heart: Regular rate and rhythm; no murmurs, rubs or bruits Abdomen: Soft, non tender and non distended. No masses, hepatosplenomegaly or hernias noted. The left lobe of the liver is palpable in the epigastrium. Normal Bowel sounds Musculoskeletal: Symmetrical with no gross deformities  Pulses:  Normal pulses noted Extremities: No clubbing, cyanosis, edema or deformities noted Neurological: Alert oriented x 4, grossly nonfocal Psychological:  Alert and cooperative. Normal mood and affect  Assessment and Recommendations:  1. 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 Elevated ACE and other serologies negative. I doubt that she has sarcoidosis. Chest x-ray last year was negative. A liver biopsy would provide more information. Discussed liver biopsy extensively and EGD to screen for varices and gastropathy. She would like to wait until her finances are better able to afford both. Low fat weight loss program. Return office in 6 months.

## 2012-10-22 ENCOUNTER — Encounter: Payer: Self-pay | Admitting: Cardiovascular Disease

## 2012-10-22 ENCOUNTER — Ambulatory Visit (INDEPENDENT_AMBULATORY_CARE_PROVIDER_SITE_OTHER): Payer: Medicare Other | Admitting: Cardiovascular Disease

## 2012-10-22 VITALS — BP 140/86 | HR 69 | Ht 62.0 in | Wt 205.0 lb

## 2012-10-22 DIAGNOSIS — R0602 Shortness of breath: Secondary | ICD-10-CM

## 2012-10-22 DIAGNOSIS — R0600 Dyspnea, unspecified: Secondary | ICD-10-CM | POA: Insufficient documentation

## 2012-10-22 DIAGNOSIS — R0609 Other forms of dyspnea: Secondary | ICD-10-CM

## 2012-10-22 NOTE — Patient Instructions (Addendum)
Your physician wants you to follow-up in: 6 months  You will receive a reminder letter in the mail two months in advance. If you don't receive a letter, please call our office to schedule the follow-up appointment.     [facility] Low Glycemic Diet (Source: [facility], 2006) Low Glycemic Foods (20-49) (Decrease risk of developing heart disease) Breakfast Cereals: All-Bran All-Bran Fruit 'n Oats Fiber One Oatmeal (not instant) Oat bran Fruits and fruit juices: (Limit to 1-2 servings per day) Apples Apricots (fresh & dried) Blackberries Blueberries Cherries Cranberries Peaches Pears Plums Prunes Grapefruit Raspberries Strawberries Tangerine Apple juice Grapefruit juice Tomato juice Beans and legumes (fresh-cooked): Black-eyed peas Butter beans Chick peas Lentils  Green beans Lima beans Kidney beans Navy beans Pinto beans Snow peas Non-starchy vegetables: Asparagus, avocado, broccoli, cabbage, cauliflower, celery, cucumber, greens, lettuce, mushrooms, peppers, tomatoes, okra, onions, spinach, summer squash Grains: Barley Bulgur Rye Wild rice Nuts and oils : Almonds Peanuts Sunflower seeds Hazelnuts Pecans Walnuts Oils that are liquid at room temperature Dairy, fish, meat, soy, and eggs: Milk, skim Lowfat cheese Yogurt, lowfat, fruit sugar sweetened Lean red meat Fish  Skinless chicken & [place] Shellfish Egg whites (up to 3 daily) Soy products  Egg yolks (up to 7 or _____ per week) Moderate Glycemic Foods (50-69) Breakfast Cereals: Bran Buds Bran Chex Just Right Mini-Wheats  Special K Swiss muesli Fruits: Banana (under-ripe) Dates Figs Grapes Kiwi Mango Oranges Raisins Fruit Juices: Cranberry juice Orange juice Beans and legumes: Boston-type baked beans Canned pinto, kidney, or navy beans Green peas Vegetables: Beets Carrots  Sweet potato Yam Corn on the cob Breads: Pita (pocket) bread Oat bran bread Pumpernickel bread Rye  bread Wheat bread, high fiber  Grains: Cornmeal Rice, brown Rice, white Couscous Pasta: Macaroni Pizza, cheese Ravioli, meat filled Spaghetti, white  Nuts: Cashews Macadamia Snacks: Chocolate Ice cream, lowfat Muffin Popcorn High Glycemic Foods (70-100)  Breakfast Cereals: Cheerios Corn Chex Corn Flakes Cream of Wheat Grape Nuts Grape Nut Flakes Grits Nutri-Grain Puffed Rice Puffed Wheat Rice Chex Rice Krispies Shredded Wheat Team Total Fruits: Pineapple Watermelon Banana (over-ripe) Beverages: Sodas, sweet tea, pineapple juice Vegetables: Potato, baked, boiled, fried, mashed [place] fries Canned or frozen corn Parsnips Winter squash Breads: Most breads (white and whole grain) Bagels Bread sticks Bread stuffing Kaiser roll Dinner rolls Grains: Rice, instant Tapioca, with milk Candy and most cookies Snacks: Donuts Corn chips Jelly beans Pretzels Pastries

## 2012-10-22 NOTE — Assessment & Plan Note (Signed)
Olivia Benson presents for further evaluation of various symptoms including severe shortness breath with exertion, fatigue, hot flashes, profound diaphoresis.   I don't think that her symptoms represent any specific cardiac disease. She has had a normal heart cath 2 years ago.  I offered to do an echocardiogram but she really did not think it we would find anything abnormal.  I've recommended that she start a regular exercise program. I've given her some information regarding the [facility] low glycemic index diet. I have asked her to consider whether or not she has sleep apnea.   She needs to have her thyroid checked. Her symptoms somewhat she may have other hormonal issues which she'll have to discuss with her medical doctor.  I'll see her again in 6 months.

## 2012-10-22 NOTE — Progress Notes (Signed)
Minus Breeding Date of Birth: [birth year]   History of Present Illness: Olivia Benson is seen today for a post hospital visit. She had recent episode of chest pain with radiation into the neck and jaw. Her cath was normal. LFTs were elevated and need recheck today. Abdominal ultrasound was normal except for a fatty liver. She has had no recurrence. She feels well. No problem with her groin. She does not exercise and is overweight which we discussed in detail today.   Sept. 11, 2014:  Olivia Benson is a 67 yo with hx of CP for years.  She had a normal heart cath.  She presents now with hx of weight gain, severe DOE, constant gas/ nausea ,  diaphoresis, tingling in hands and feet.   She has significant heat intolerence ( hot flashes, sweats)   She had not been exercising at all.  She has gained 25 lbs over the past 2 years.  Current Outpatient Prescriptions on File Prior to Visit  Medication Sig Dispense Refill  . albuterol (PROVENTIL HFA;VENTOLIN HFA) 108 (90 BASE) MCG/ACT inhaler Inhale 2 puffs into the lungs every 4 (four) hours as needed.        Marland Kitchen aspirin 81 MG tablet Take 81 mg by mouth daily.        Marland Kitchen estrogens conjugated, synthetic A, (CENESTIN) 0.625 MG tablet Take 0.625 mg by mouth daily.        Marland Kitchen losartan (COZAAR) 25 MG tablet Take 25 mg by mouth daily.        . metFORMIN (GLUCOPHAGE) 500 MG tablet Take 500 mg by mouth 2 (two) times daily with a meal.        . metoprolol succinate (TOPROL-XL) 25 MG 24 hr tablet Take 25 mg by mouth daily.        . pantoprazole (PROTONIX) 40 MG tablet Take 40 mg by mouth daily.         No current facility-administered medications on file prior to visit.    Allergies  Allergen Reactions  . Shellfish Allergy     HER THROAT SWELLS UP AND CLOSES  . Penicillins Rash    WHEN SHE WAS TEENAGER    Past Medical History  Diagnosis Date  . Diabetes mellitus   . Asthma   . Overactive bladder   . Non-ST elevated myocardial infarction (non-STEMI) Sept 2012   Mild elevation in troponin and CK MB but no evidence of coronary disease at time of cath  . Elevated LFTs   . Obesity   . Hypertension   . Cirrhosis   . Portal hypertension     Past Surgical History  Procedure Laterality Date  . Tonsillectomy  1971  . Abdominal ultrasound  Sept 2012    No gallbladder disease, + fatty liver  . Cardiac catheterization  10/25/2010    EF 60%; Normal coronaries  . Colonoscopy  10/18/2005  . Wh-mammography  11/24/2009  . Partial hysterectomy  1988  . Cosmetic surgery  2003    History  Smoking status  . Former Smoker  . Quit date: 11/05/1980  Smokeless tobacco  . Never Used    History  Alcohol Use  . Yes    Comment: socially    Family History  Problem Relation Age of Onset  . Asthma Mother   . Diabetes Mother   . Hypertension Mother   . Cancer Father     bladder  . Stroke Father   . Hypertension Father   . Breast cancer Sister   .  Diabetes Brother   . Diabetes Sister     x 3    Review of Systems: The review of systems is as above.  All other systems were reviewed and are negative.  Physical Exam: BP 140/86  Pulse 69  Ht 5' 2"  (1.575 m)  Wt 205 lb (92.987 kg)  BMI 37.49 kg/m2  SpO2 97% Patient is very pleasant and in no acute distress. She is obese. Skin is warm and dry. Color is normal.  HEENT is unremarkable. Normocephalic/atraumatic. PERRL. Sclera are nonicteric. Neck is supple. No masses. No JVD. Lungs are clear. Cardiac exam shows a regular rate and rhythm. Abdomen is obese but soft. Extremities are without edema. Gait and ROM are intact. No gross neurologic deficits noted.  LABORATORY DATA:  ECG:   Sept. 11, 2014:   NSR at 4. Normal ECG  Assessment / Plan:

## 2013-09-01 ENCOUNTER — Ambulatory Visit (INDEPENDENT_AMBULATORY_CARE_PROVIDER_SITE_OTHER): Payer: Medicare Other | Admitting: Internal Medicine

## 2013-09-01 ENCOUNTER — Encounter: Payer: Self-pay | Admitting: Internal Medicine

## 2013-09-01 VITALS — BP 156/82 | HR 63 | Temp 98.0°F | Ht 61.5 in | Wt 207.0 lb

## 2013-09-01 DIAGNOSIS — I10 Essential (primary) hypertension: Secondary | ICD-10-CM

## 2013-09-01 DIAGNOSIS — J45909 Unspecified asthma, uncomplicated: Secondary | ICD-10-CM

## 2013-09-01 DIAGNOSIS — J4540 Moderate persistent asthma, uncomplicated: Secondary | ICD-10-CM

## 2013-09-01 MED ORDER — NEBIVOLOL HCL 10 MG PO TABS: 10.0000 mg | ORAL_TABLET | Freq: Every day | ORAL | Status: DC

## 2013-09-01 MED ORDER — MOMETASONE FURO-FORMOTEROL FUM 100-5 MCG/ACT IN AERO: INHALATION_SPRAY | RESPIRATORY_TRACT | Status: DC

## 2013-09-01 NOTE — Progress Notes (Signed)
Subjective:    Patient ID: Olivia Benson, female    DOB: [birth year]  MRN: [id number]  HPI  48 yowf quit smoking 1984 with h/o fall allergies s asthma then in ? 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 Pulmonary Consult    Referred per Dr Helane Rima.  Pt c/o cough and dyspnea "all my life".  She states that she is SOB "with anything I do".  Cough is "deep" and non prod, coughs until the point of gagging. Cough is worse at night and first thing in the am.   decades of dry cough and sob seemed some better p quit smoking, cough seems to peak  at hs then goes to sleep but constant senstion of pnds and Uses saba up to sev times a day to control sob  No obvious other patterns in day to day or daytime variabilty or assoc chronic cough or cp or chest tightness, subjective wheeze overt sinus or hb symptoms. No unusual exp hx or h/o childhood pna/ asthma or knowledge of premature birth.  Sleeping ok without nocturnal  or early am exacerbation  of respiratory  c/o's or need for noct saba. Also denies any obvious fluctuation of symptoms with weather or environmental changes or other aggravating or alleviating factors except as outlined above   Current Medications, Allergies, Complete Past Medical History, Past Surgical History, Family History, and Social History were reviewed in [employer] record.              Review of Systems  Constitutional: Negative for fever, chills and unexpected weight change.  HENT: Positive for congestion  and sneezing. Negative for dental problem, ear pain, nosebleeds, postnasal drip, rhinorrhea, sinus pressure, sore throat, trouble swallowing and voice change.   Eyes: Negative for visual disturbance.  Respiratory: Positive for cough and shortness of breath. Negative for choking.   Cardiovascular: Negative for chest pain and leg swelling.  Gastrointestinal: Negative for vomiting, abdominal pain and diarrhea.  Genitourinary: Negative for difficulty urinating.  Musculoskeletal: Negative for arthralgias.  Skin: Negative for rash.  Neurological: Positive for headaches. Negative for tremors and syncope.  Hematological: Does not bruise/bleed easily.       Objective:   Physical Exam  amb wf nad with cough breathing out  Wt Readings from Last 3 Encounters:  09/01/13 207 lb (93.895 kg)  10/22/12 205 lb (92.987 kg)  10/07/11 197 lb 12.8 oz (89.721 kg)     HEENT: nl dentition, turbinates, and orophanx. Nl external ear canals without cough reflex   NECK :  without JVD/Nodes/TM/ nl carotid upstrokes bilaterally   LUNGS: no acc muscle use, clear to A and P bilaterally with cough on exp and trace wheeze   CV:  RRR  no s3 or murmur or increase in P2, no edema   ABD:  soft and nontender with nl excursion in the supine position. No bruits or organomegaly, bowel sounds nl  MS:  warm without deformities, calf tenderness, cyanosis or clubbing  SKIN: warm and dry without lesions    NEURO:  alert, approp, no deficits   cxr July 14 2013 "OK"       Assessment & Plan:

## 2013-09-01 NOTE — Patient Instructions (Signed)
Change protonix Take 30-60 min before first meal of the day and pepcid 20 mg at bedtime along with chlorpheniramine 4 mg at bedtime  Stop cozar and toprol and just take bystolic 10 mg one daily   Dulera 100 Take 2 puffs first thing in am and then another 2 puffs about 12 hours later.    Only use your albuterol (proair(as a rescue medication to be used if you can't catch your breath by resting or doing a relaxed purse lip breathing pattern.  - The less you use it, the better it will work when you need it. - Ok to use up to 2 puffs  every 4 hours if you must but call for immediate appointment if use goes up over your usual need - Don't leave home without it !!  (think of it like the spare tire for your car)   GERD (REFLUX)  is an extremely common cause of respiratory symptoms, many times with no significant heartburn at all.    It can be treated with medication, but also with lifestyle changes including avoidance of late meals, excessive alcohol, smoking cessation, and avoid fatty foods, chocolate, peppermint, colas, red wine, and acidic juices such as orange juice.  NO MINT OR MENTHOL PRODUCTS SO NO COUGH DROPS  USE SUGARLESS CANDY INSTEAD (jolley ranchers or Stover's)  NO OIL BASED VITAMINS - use powdered substitutes.  Please schedule a follow up office visit in 2 weeks, sooner if needed

## 2013-09-02 DIAGNOSIS — J45991 Cough variant asthma: Secondary | ICD-10-CM | POA: Insufficient documentation

## 2013-09-02 DIAGNOSIS — I10 Essential (primary) hypertension: Secondary | ICD-10-CM | POA: Insufficient documentation

## 2013-09-02 NOTE — Assessment & Plan Note (Addendum)
DDX of  difficult airways management all start with A and  include Adherence, Ace Inhibitors, Acid Reflux, Active Sinus Disease, Alpha 1 Antitripsin deficiency, Anxiety masquerading as Airways dz,  ABPA,  allergy(esp in young), Aspiration (esp in elderly), Adverse effects of DPI,  Active smokers, plus two Bs  = Bronchiectasis and Beta blocker use..and one C= CHF   Adherence is always the initial "prime suspect" and is a multilayered concern that requires a "trust but verify" approach in every patient - starting with knowing how to use medications, especially inhalers, correctly, keeping up with refills and understanding the fundamental difference between maintenance and prns vs those medications only taken for a very short course and then stopped and not refilled.  The proper method of use, as well as anticipated side effects, of a metered-dose inhaler are discussed and demonstrated to the patient. Improved effectiveness after extensive coaching during this visit to a level of approximately  75% so try dulera 100 2bid  ? Acid (or non-acid) GERD > always difficult to exclude as up to 75% of pts in some series report no assoc GI/ Heartburn symptoms> rec max (24h)  acid suppression and diet restrictions/ reviewed and instructions given in writing  ? Adverse effect of advair > try hfa low dose laba/ics > dulera 100 2bid.    ? acei like arb effect > For reasons that may related to vascular permability and nitric oxide pathways but not elevated  bradykinin levels (as seen with  ACEi use) losartan in the generic form has been reported now from mulitple sources  to cause a similar pattern of non-specific  upper airway symptoms as seen with acei.   This has not been reported with exposure to the other ARB's to date, so it seems reasonable for now to try either generic diovan or avapro if ARB needed or use an alternative class altogether.  See:  Lelon Frohlich Allergy Asthma Immunol  2008: 101: p [phone]    ? Bb effect >  Strongly prefer in this setting: Bystolic, the most beta -1  selective Beta blocker available in sample form, with bisoprolol the most selective generic choice  on the market.

## 2013-09-02 NOTE — Assessment & Plan Note (Signed)
See asthma a/p  > try bystolic 10 mg per day samples for now and recheck in 2 weeks

## 2013-09-15 ENCOUNTER — Encounter: Payer: Self-pay | Admitting: Internal Medicine

## 2013-09-15 ENCOUNTER — Ambulatory Visit (INDEPENDENT_AMBULATORY_CARE_PROVIDER_SITE_OTHER): Payer: Medicare Other | Admitting: Internal Medicine

## 2013-09-15 VITALS — BP 122/70 | HR 58 | Temp 97.7°F | Ht 61.5 in | Wt 205.0 lb

## 2013-09-15 DIAGNOSIS — I10 Essential (primary) hypertension: Secondary | ICD-10-CM

## 2013-09-15 DIAGNOSIS — J45991 Cough variant asthma: Secondary | ICD-10-CM

## 2013-09-15 MED ORDER — TRAMADOL HCL 50 MG PO TABS: ORAL_TABLET | ORAL | Status: DC

## 2013-09-15 MED ORDER — METHYLPREDNISOLONE ACETATE 80 MG/ML IJ SUSP
120.0000 mg | Freq: Once | INTRAMUSCULAR | Status: AC
  Administered 2013-09-15: 120 mg via INTRAMUSCULAR

## 2013-09-15 MED ORDER — FLUTTER DEVI: Status: DC

## 2013-09-15 NOTE — Patient Instructions (Addendum)
Change protonix Take 30-60 min before first meal of the day and pepcid 20 mg at bedtime along with chlorpheniramine 4 mg at bedtime  Continue  bystolic 10 mg one daily   contineu Dulera 100 Take 2 puffs first thing in am and then another 2 puffs about 12 hours later.   For cough > use flutter valve,  Take delsym two tsp every 12 hours and supplement if needed with  tramadol 50 mg up to 2 every 4 hours to suppress the urge to cough. Swallowing water or using ice chips/non mint and menthol containing candies (such as lifesavers or sugarless jolly ranchers) are also effective.  You should rest your voice and avoid activities that you know make you cough.  Once you have eliminated the cough for 3 straight days try reducing the tramadol first,  then the delsym as tolerated.      Please schedule a follow up office visit in 2 weeks, sooner if needed

## 2013-09-15 NOTE — Progress Notes (Signed)
Subjective:    Patient ID: Olivia Benson, female    DOB: [birth year]  MRN: [id number]   Brief patient profile:  78 yowf quit smoking 1984 with h/o fall allergies s asthma then in ? 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 Pulmonary Consult    Referred per Olivia Benson.  Pt c/o cough and dyspnea "all my life".  She states that she is SOB "with anything I do".  Cough is "deep" and non prod, coughs until the point of gagging. Cough is worse at night and first thing in the am.   decades of dry cough and sob seemed some better p quit smoking, cough seems to peak  at hs then goes to sleep but constant senstion of pnds and Uses saba up to sev times a day to control sob rec Change protonix Take 30-60 min before first meal of the day and pepcid 20 mg at bedtime along with chlorpheniramine 4 mg at bedtime Stop cozar and toprol and just take bystolic 10 mg one daily  Dulera 100 Take 2 puffs first thing in am and then another 2 puffs about 12 hours later. Only use your albuterol as rescue  GERD diet    09/15/2013 f/u ov/Olivia Benson re: cough since sept 2014 / hoarseness since started dulera  Chief Complaint  Patient presents with  . Follow-up    Cough and SOB are some better. She c/o hoarsenss that started right after her last visit. She states that hoarness starts mid morning and last until the evening. She has had to use albuterol inhaler x 2 since last visit.   cough in am's are better, worse in evening after supper, no longer needs  saba for sob   No obvious day to day or daytime variabilty or assoc cp or chest tightness, subjective wheeze overt sinus or hb symptoms. No unusual exp hx or h/o childhood pna/ asthma or knowledge of premature birth.  Sleeping ok without nocturnal  or early am exacerbation  of respiratory  c/o's or need for noct saba. Also denies any obvious fluctuation of symptoms with weather or environmental changes or other aggravating or alleviating factors except as outlined above   Current Medications, Allergies, Complete Past Medical History, Past Surgical History, Family History, and Social History were reviewed in [employer] record.  ROS  The following are not active complaints unless bolded sore throat, dysphagia, dental problems, itching, sneezing,  nasal congestion or excess/ purulent secretions, ear ache,   fever, chills, sweats, unintended wt loss, pleuritic or exertional cp, hemoptysis,  orthopnea pnd or leg swelling, presyncope, palpitations, heartburn, abdominal pain, anorexia, nausea, vomiting, diarrhea  or change in bowel or urinary habits, change in stools or urine, dysuria,hematuria,  rash, arthralgias, visual complaints, headache, numbness weakness or ataxia or problems with walking or coordination,  change in mood/affect or memory.                        Objective:   Physical Exam  amb wf nad with no longer cough breathing out  09/15/2013         205  Wt Readings from Last 3 Encounters:  09/01/13 207 lb (93.895 kg)  10/22/12 205 lb (92.987 kg)  10/07/11 197 lb 12.8 oz (89.721 kg)     HEENT: nl dentition, turbinates, and orophanx. Nl external ear canals without cough reflex   NECK :  without JVD/Nodes/TM/ nl carotid upstrokes bilaterally   LUNGS: no acc muscle use, clear to A and P bilaterally with now no cough on exp     CV:  RRR  no s3 or murmur or increase in P2, no edema   ABD:  soft and nontender with nl excursion in the supine position.  No bruits or organomegaly, bowel sounds nl  MS:  warm without deformities, calf tenderness, cyanosis or clubbing  SKIN: warm and dry without lesions    NEURO:  alert, approp, no deficits   cxr July 14 2013 "OK"       Assessment & Plan:

## 2013-09-16 NOTE — Assessment & Plan Note (Addendum)
Try off cozar and toprol due to ? uacs/asthma> bystolic started 5/39/6728   Marginally Adequate control on present rx, reviewed > no change in rx needed  = bystolic 10 mg daily

## 2013-09-16 NOTE — Assessment & Plan Note (Signed)
Clearly better on dulera though it may be causing hoarseness with a pattern of daytime/cyclical cough suggestive of uacs that has not been eliminated  I had an extended discussion with the patient today lasting 15 to 20 minutes of a 25 minute visit on the following issues: The only way to stop coughing is to first stop coughing then begin to work toward [employer] simplifying longterm rx which probably will include qvar instead of dulera with less upper airway side effects  The proper method of use, as well as anticipated side effects, of a metered-dose inhaler are discussed and demonstrated to the patient. Improved effectiveness after extensive coaching during this visit to a level of approximately  90% from baseline of 50% so continue dulera for now.    Each maintenance medication was reviewed in detail including most importantly the difference between maintenance and as needed and under what circumstances the prns are to be used.  Please see instructions for details which were reviewed in writing and the patient given a copy.

## 2013-09-29 ENCOUNTER — Ambulatory Visit (INDEPENDENT_AMBULATORY_CARE_PROVIDER_SITE_OTHER): Payer: Medicare Other | Admitting: Internal Medicine

## 2013-09-29 ENCOUNTER — Encounter: Payer: Self-pay | Admitting: Internal Medicine

## 2013-09-29 VITALS — BP 128/82 | HR 55 | Ht 61.5 in | Wt 193.6 lb

## 2013-09-29 DIAGNOSIS — J45991 Cough variant asthma: Secondary | ICD-10-CM

## 2013-09-29 DIAGNOSIS — I10 Essential (primary) hypertension: Secondary | ICD-10-CM

## 2013-09-29 MED ORDER — NEBIVOLOL HCL 10 MG PO TABS: 10.0000 mg | ORAL_TABLET | Freq: Every day | ORAL | Status: DC

## 2013-09-29 NOTE — Assessment & Plan Note (Addendum)
Try off cozar and toprol due to ? uacs/asthma> bystolic started 2/48/2500   Adequate control on present rx, reviewed > no change in rx needed  :  Strongly prefer in this setting: Bystolic, the most beta -1  selective Beta blocker available in sample form, with bisoprolol the most selective generic choice  on the market.   For now continue bystolic 10 mg daily

## 2013-09-29 NOTE — Progress Notes (Signed)
Subjective:    Patient ID: Olivia Benson, female    DOB: September 17, 1945  MRN: [id number]   Brief patient profile:  52 yowf quit smoking 1984 with h/o fall allergies s asthma then in ? 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 Pulmonary Consult    Referred per Dr Helane Rima.  Pt c/o cough and dyspnea "all my life".  She states that she is SOB "with anything I do".  Cough is "deep" and non prod, coughs until the point of gagging. Cough is worse at night and first thing in the am.   decades of dry cough and sob seemed some better p quit smoking, cough seems to peak  at hs then goes to sleep but constant senstion of pnds and Uses saba up to sev times a day to control sob rec Change protonix Take 30-60 min before first meal of the day and pepcid 20 mg at bedtime along with chlorpheniramine 4 mg at bedtime Stop cozar and toprol and just take bystolic 10 mg one daily  Dulera 100 Take 2 puffs first thing in am and then another 2 puffs about 12 hours later. Only use your albuterol as rescue  GERD diet    09/15/2013 f/u ov/Kenan Moodie re: cough since sept 2014 / hoarseness since started dulera  Chief Complaint  Patient presents with  . Follow-up    Cough and SOB are some better. She c/o hoarsenss that started right after her last visit. She states that hoarness starts mid morning and last until the evening. She has had to use albuterol inhaler x 2 since last visit.   cough in am's are better, worse in  evening after supper, no longer needs saba for sob rec Change protonix Take 30-60 min before first meal of the day and pepcid 20 mg at bedtime along with chlorpheniramine 4 mg at bedtime Continue  bystolic 10 mg one daily  continue Dulera 100 Take 2 puffs first thing in am and then another 2 puffs about 12 hours later.  For cough > use flutter valve,  Take delsym two tsp every 12 hours and supplement if needed with  tramadol 50 mg up to 2 every 4 hours   09/29/2013 f/u ov/Cailen Mihalik re: chronic cough  Chief Complaint  Patient presents with  . Follow-up    Pt states cough is improved since last OV--Pt reports that she has been using meds as Rx'd but has not been using Flutter Device. Pt c/o incr hoarseness.     Not limited by breathing from desired activities    No obvious day to day or daytime variabilty or assoc cp or chest tightness, subjective wheeze overt sinus or hb symptoms. No unusual exp hx or h/o childhood pna/ asthma or knowledge of premature birth.  Sleeping ok without nocturnal  or early am exacerbation  of respiratory  c/o's or need for noct saba. Also denies any obvious fluctuation of symptoms with weather or environmental changes or other aggravating  or alleviating factors except as outlined above   Current Medications, Allergies, Complete Past Medical History, Past Surgical History, Family History, and Social History were reviewed in [employer] record.  ROS  The following are not active complaints unless bolded sore throat, dysphagia, dental problems, itching, sneezing,  nasal congestion or excess/ purulent secretions, ear ache,   fever, chills, sweats, unintended wt loss, pleuritic or exertional cp, hemoptysis,  orthopnea pnd or leg swelling, presyncope, palpitations, heartburn, abdominal pain, anorexia, nausea, vomiting, diarrhea  or change in bowel or urinary habits, change in stools or urine, dysuria,hematuria,  rash, arthralgias, visual complaints,  headache, numbness weakness or ataxia or problems with walking or coordination,  change in mood/affect or memory.                        Objective:   Physical Exam  amb wf nad with no longer cough breathing out  09/15/2013         205 >  09/29/2013  194  Wt Readings from Last 3 Encounters:  09/01/13 207 lb (93.895 kg)  10/22/12 205 lb (92.987 kg)  10/07/11 197 lb 12.8 oz (89.721 kg)     HEENT: nl dentition, turbinates, and orophanx. Nl external ear canals without cough reflex   NECK :  without JVD/Nodes/TM/ nl carotid upstrokes bilaterally   LUNGS: no acc muscle use, clear to A and P bilaterally with now no cough on exp     CV:  RRR  no s3 or murmur or increase in P2, no edema   ABD:  soft and nontender with nl excursion in the supine position. No bruits or organomegaly, bowel sounds nl  MS:  warm without deformities, calf tenderness, cyanosis or clubbing  SKIN: warm and dry without lesions    NEURO:  alert, approp, no deficits   cxr July 14 2013 "OK"       Assessment & Plan:

## 2013-09-29 NOTE — Patient Instructions (Addendum)
Try off dulera to see what difference if any it makes  For drainage take chlortrimeton (chlorpheniramine) 4 mg every 4 hours available over the counter (may cause drowsiness)   Continue by bystolic 10 mg - let me know if you can't afford it and we'll call a generic in  Only use flutter if you can't control the cough   Please schedule a follow up office visit in 6 weeks, call sooner if needed  Late add :  Needs pfts off dulera for baseline

## 2013-09-29 NOTE — Assessment & Plan Note (Signed)
-   09/15/2013 p extensive coaching HFA effectiveness =    90%  - added flutter valve 09/15/13  - trial off dulera due to hoarseness  Still not clear whether this is asthma vs uacs > the fact that dulera is making the hoarseness worse favors uacs so reasonable to try off and maintaint on max rx for uacs :  GERD and 1st gen h1 prn and f/u prn flare     Each maintenance medication was reviewed in detail including most importantly the difference between maintenance and as needed and under what circumstances the prns are to be used.  Please see instructions for details which were reviewed in writing and the patient given a copy.

## 2013-10-04 ENCOUNTER — Telehealth: Payer: Self-pay | Admitting: Internal Medicine

## 2013-10-04 ENCOUNTER — Other Ambulatory Visit: Payer: Self-pay | Admitting: Internal Medicine

## 2013-10-04 DIAGNOSIS — J45991 Cough variant asthma: Secondary | ICD-10-CM

## 2013-10-04 NOTE — Telephone Encounter (Signed)
Retry with just one puff each am plus one extra dose at hs if cough also while slowing

## 2013-10-04 NOTE — Telephone Encounter (Signed)
LMTCB

## 2013-10-04 NOTE — Telephone Encounter (Signed)
Spoke with pt  She stopped Dulera after last visit 8/19 Now hoarseness is gone but the cough came back  Cough is non prod  Pt has ov with MW with PFT for 11/10/13 and wants to know how to proceed in the meantime  Please advise, thanks!

## 2013-10-05 ENCOUNTER — Telehealth: Payer: Self-pay | Admitting: Internal Medicine

## 2013-10-05 MED ORDER — MOMETASONE FURO-FORMOTEROL FUM 100-5 MCG/ACT IN AERO: INHALATION_SPRAY | RESPIRATORY_TRACT | Status: DC

## 2013-10-05 NOTE — Telephone Encounter (Signed)
See phone msg from 10/04/13. Spoke with pt.  She needs dulera rx. Rx sent to [facility] Pt aware and voiced no further questions or concerns at this time.

## 2013-10-05 NOTE — Telephone Encounter (Signed)
Patient is needing dulera rx.  She has only been given samples.  Pls send to [facility].  [phone]

## 2013-10-05 NOTE — Telephone Encounter (Signed)
lmomtcb for pt 

## 2013-10-05 NOTE — Telephone Encounter (Signed)
Pt returning call.Olivia Benson ° °

## 2013-10-05 NOTE — Telephone Encounter (Signed)
Spoke with pt, she is aware of results and recs.  Nothing further needed. 

## 2013-10-14 ENCOUNTER — Telehealth: Payer: Self-pay | Admitting: Internal Medicine

## 2013-10-14 NOTE — Telephone Encounter (Signed)
Can try bisoprolol 2.5 mg daily but will need bp checked a week later by primary or Tammy NP

## 2013-10-14 NOTE — Telephone Encounter (Signed)
Called and spoke with pt and she stated that the bystolic is too expensive--costs her $70 after her insurance and she cannot afford this along with her other medications.  Pt is requesting an alternative to the bystolic.  MW please advise. Thanks  Allergies  Allergen Reactions  . Shellfish Allergy     HER THROAT SWELLS UP AND CLOSES  . Penicillins Rash    WHEN SHE WAS TEENAGER    Current Outpatient Prescriptions on File Prior to Visit  Medication Sig Dispense Refill  . albuterol (PROVENTIL HFA;VENTOLIN HFA) 108 (90 BASE) MCG/ACT inhaler Inhale 2 puffs into the lungs every 4 (four) hours as needed.        Marland Kitchen aspirin 81 MG tablet Take 81 mg by mouth daily.        . chlorpheniramine (CHLOR-TRIMETON) 4 MG tablet Take 4 mg by mouth at bedtime.      Marland Kitchen estradiol (ESTRACE) 1 MG tablet Take 1 mg by mouth daily.      . famotidine (PEPCID) 20 MG tablet Take 20 mg by mouth at bedtime.      . mometasone (NASONEX) 50 MCG/ACT nasal spray Place 2 sprays into the nose as needed.      . mometasone-formoterol (DULERA) 100-5 MCG/ACT AERO Inhale one puff each morning plus one extra dose at bedtime if coughing  1 Inhaler  3  . montelukast (SINGULAIR) 10 MG tablet Take 10 mg by mouth at bedtime.       . nebivolol (BYSTOLIC) 10 MG tablet Take 1 tablet (10 mg total) by mouth daily.  30 tablet  11  . pantoprazole (PROTONIX) 40 MG tablet Take 40 mg by mouth daily.        Marland Kitchen Respiratory Therapy Supplies (FLUTTER) DEVI Use as directed  1 each  0  . traMADol (ULTRAM) 50 MG tablet 1-2 every 4 hours as needed for cough or pain  40 tablet  0   No current facility-administered medications on file prior to visit.

## 2013-10-15 MED ORDER — BISOPROLOL FUMARATE 5 MG PO TABS: ORAL_TABLET | ORAL | Status: DC

## 2013-10-15 NOTE — Telephone Encounter (Signed)
Spoke with pt. Aware of recs. RX sent in. She will call back to make appt. Nothing further needed

## 2013-10-15 NOTE — Telephone Encounter (Signed)
Pt returned call- [phone]

## 2013-10-15 NOTE — Telephone Encounter (Signed)
lmomtcb x1 

## 2013-11-10 ENCOUNTER — Ambulatory Visit: Payer: Medicare Other | Admitting: Internal Medicine

## 2013-11-11 ENCOUNTER — Other Ambulatory Visit: Payer: Self-pay | Admitting: Obstetrics and Gynecology

## 2013-11-11 DIAGNOSIS — R188 Other ascites: Secondary | ICD-10-CM

## 2013-11-11 DIAGNOSIS — R1900 Intra-abdominal and pelvic swelling, mass and lump, unspecified site: Secondary | ICD-10-CM

## 2013-11-12 ENCOUNTER — Ambulatory Visit
Admission: RE | Admit: 2013-11-12 | Discharge: 2013-11-12 | Disposition: A | Discharge status: Home / Self Care | Payer: Medicare Other | Source: Ambulatory Visit | Attending: Obstetrics and Gynecology | Admitting: Obstetrics and Gynecology

## 2013-11-12 DIAGNOSIS — R188 Other ascites: Secondary | ICD-10-CM

## 2013-11-12 DIAGNOSIS — R1900 Intra-abdominal and pelvic swelling, mass and lump, unspecified site: Secondary | ICD-10-CM

## 2013-11-12 MED ORDER — IOHEXOL 300 MG/ML  SOLN
100.0000 mL | Freq: Once | INTRAMUSCULAR | Status: AC | PRN
  Administered 2013-11-12: 100 mL via INTRAVENOUS

## 2013-11-15 ENCOUNTER — Ambulatory Visit: Payer: Medicare Other | Attending: Gynecologic Oncology | Admitting: Gynecologic Oncology

## 2013-11-15 ENCOUNTER — Encounter: Payer: Self-pay | Admitting: Gynecologic Oncology

## 2013-11-15 ENCOUNTER — Ambulatory Visit: Payer: Medicare Other

## 2013-11-15 VITALS — BP 161/66 | HR 53 | Temp 97.6°F | Resp 20 | Ht 61.5 in

## 2013-11-15 DIAGNOSIS — F17210 Nicotine dependence, cigarettes, uncomplicated: Secondary | ICD-10-CM | POA: Diagnosis not present

## 2013-11-15 DIAGNOSIS — Z7982 Long term (current) use of aspirin: Secondary | ICD-10-CM | POA: Diagnosis not present

## 2013-11-15 DIAGNOSIS — R188 Other ascites: Secondary | ICD-10-CM | POA: Diagnosis not present

## 2013-11-15 DIAGNOSIS — K760 Fatty (change of) liver, not elsewhere classified: Secondary | ICD-10-CM

## 2013-11-15 DIAGNOSIS — K7460 Unspecified cirrhosis of liver: Secondary | ICD-10-CM | POA: Insufficient documentation

## 2013-11-15 DIAGNOSIS — I252 Old myocardial infarction: Secondary | ICD-10-CM | POA: Insufficient documentation

## 2013-11-15 DIAGNOSIS — I10 Essential (primary) hypertension: Secondary | ICD-10-CM | POA: Diagnosis not present

## 2013-11-15 DIAGNOSIS — J45909 Unspecified asthma, uncomplicated: Secondary | ICD-10-CM | POA: Diagnosis not present

## 2013-11-15 DIAGNOSIS — R971 Elevated cancer antigen 125 [CA 125]: Secondary | ICD-10-CM | POA: Insufficient documentation

## 2013-11-15 DIAGNOSIS — Z79899 Other long term (current) drug therapy: Secondary | ICD-10-CM | POA: Diagnosis not present

## 2013-11-15 DIAGNOSIS — E119 Type 2 diabetes mellitus without complications: Secondary | ICD-10-CM | POA: Diagnosis not present

## 2013-11-15 LAB — PROTIME-INR
INR: 1.3 — ABNORMAL LOW (ref 2.00–3.50)
Protime: 15.6 Seconds — ABNORMAL HIGH (ref 10.6–13.4)

## 2013-11-15 LAB — COMPREHENSIVE METABOLIC PANEL (CC13)
ALT: 30 U/L (ref 0–55)
AST: 38 U/L — ABNORMAL HIGH (ref 5–34)
Albumin: 3.5 g/dL (ref 3.5–5.0)
Alkaline Phosphatase: 129 U/L (ref 40–150)
Anion Gap: 6 mEq/L (ref 3–11)
BUN: 10.4 mg/dL (ref 7.0–26.0)
CO2: 26 mEq/L (ref 22–29)
Calcium: 9.3 mg/dL (ref 8.4–10.4)
Chloride: 107 mEq/L (ref 98–109)
Creatinine: 0.8 mg/dL (ref 0.6–1.1)
Glucose: 78 mg/dl (ref 70–140)
Potassium: 3.8 mEq/L (ref 3.5–5.1)
Sodium: 140 mEq/L (ref 136–145)
Total Bilirubin: 0.66 mg/dL (ref 0.20–1.20)
Total Protein: 9.2 g/dL — ABNORMAL HIGH (ref 6.4–8.3)

## 2013-11-15 NOTE — Patient Instructions (Signed)
Our office will call you with the lab and [facility] results.

## 2013-11-15 NOTE — Progress Notes (Signed)
Consult Note: Gyn-Onc  Consult was requested by Dr. Ronita Hipps for the evaluation of Olivia Benson("Olivia Benson"), a 68 y.o. female with ascites, elevated CA 125, and pelvic peritoneal thickening  CC: Dr Brien Few Chief Complaint  Patient presents with  . Ascites    Assessment/Plan:  [name]") is a 68 y.o.  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 She has ascites, pelvic peritoneal thickening (on CT and physical examination) and an elevated CA 125. She also has a history of unexplained fatty liver cirrhosis which appears progressive on imaging since 2013 and she has persistent elevation in LFT's. Obviously cirrhosis can cause both ascites and an elevated CA 125.  I discussed with Olivia Benson that we will evaluate her for ovarian cancer by performing an US guided paracentesis and requesting cytology on the results. I discussed that we will first ascertain that her coagulation status is normal. If ovarian/primary peritoneal cancer is identified, I will recommend neoadjuvant chemotherapy for 3 cycles followed by interval cytoreduction. I favor this over primary upfront debulking for Olivia Benson given that she has no dominant masses on imaging to "debulk", but given her substantial splenomegaly, cirrhosis and portal hypertension, she would represent a substantial risk for major perioperative morbidity (particularly if a substantial laparotomy and rectal resection were performed, which, given the palpable tumor in her cul de sac on RV exam I am concerned would be necessary). If she demonstrates a good response to neoadjuvant chemotherapy, a less radical resection (including a BSO, omentectomy, peritoneal stripping, possible bowel resection) might be possible and a minimally invasive approach might be feasible.  I discussed with Olivia Benson that regardless of the findings of her cytology (benign or malignant), she will need further GI workup of her cirrhosis, as, if  this is benign ascites it represents substantial progression in her liver disease, and/or if she has ovarian cancer and requires cytotoxic chemotherapy, optimization of her liver disease is necessary.   I will call Olivia Benson with her cytology results, and we will move towards Gastroenterology, +/- Medical Oncology consultation as appropriate.   HPI: Olivia Benson is a 68 year old G1P1 who is seen in consultation at the request of Dr Ronita Hipps for the findings of a pelvic mass on examination. Olivia Benson has had approximately 1 year history of cough, for which she underwent workup with Dr [name], a pulmonologist at [facility]. With extensive evaluation and medication modification, she feels "a million percent better". However, she continued to have stress urinary incontinence ( a symptom she has had for approximately 2 years). She had initially felt the SUI was associated with the cough, however, when she noticed that it did not improve with resolution of her cough, she sought further evaluation. She also reports difficulty emptying her bladder of urine (feels that after she goes she has to sit and wait a while before she can completely empty). She also notes mild pulling discomfort in the suprapubic area for 2 months.  In September 2015 she was seen by her OBGYN Dr Ronita Hipps for the persistent SUI symptoms. He performed a pelvic examination which revealed a pelvic mass. He ordered a CA 125: 1195 on 11/08/13; and a CT of the abdomen and pelvis on 11/12/13 which showed:  - A large amount of ascites present.  - Findings of progressive hepatic cirrhosis with a large amount of ascites and mild splenomegaly. There are venous collaterals present.The liver is shrunken and its surface is markedly irregular with mild splenomegaly. There prominence  of mesenteric venous structures.   - The urinary bladder is partially distended and grossly normal.  - Increased peritoneal soft tissue density within the pelvis fairly symmetric from right to left. This  could reflect mesenteric implants in the appropriate clinical setting. There are similar findings noted along the anterior mid and lower peritoneal surface on the left.  - No definite focal adnexal masses are demonstrated.  - There is a small left pleural effusion layering posteriorly.     The patient has had a hysterectomy for benign indications in 1988 (TAH). She has a sister, a maternal aunt and a maternal cousin with breast cancer (none have been tested for BRCA).  Of note, in 2013 she had undergone a CT to work up abnormal LFT's. This showed an enlarged irregularly contoured liver and splenomegaly consistent with cirrhosis. A cirrhosis workup was unremarkable and her LFT's trended down, and therefore no further action was taken. She denies alcoholism, and rarely drinks.  She has lab's from her PCP in July, 2015 which show Alk Phos 125, AST 57 and ALT 49 (which is substantially lower than they had been in 2013, though still elevated). Total bilirubin is normla at 0.5.   Interval History: The patient reports poor appetite, early satiety and abdominal bloating.  Current Meds:  Outpatient Encounter Prescriptions as of 11/15/2013  Medication Sig  . aspirin 81 MG tablet Take 81 mg by mouth daily.    . chlorpheniramine (CHLOR-TRIMETON) 4 MG tablet Take 4 mg by mouth at bedtime.  Marland Kitchen estradiol (ESTRACE) 1 MG tablet Take 1 mg by mouth daily.  . famotidine (PEPCID) 20 MG tablet Take 20 mg by mouth at bedtime.  Marland Kitchen MICROLET LANCETS MISC Test 2  times daily. Dx 250.02  . mometasone (NASONEX) 50 MCG/ACT nasal spray Place 2 sprays into the nose as needed.  . mometasone-formoterol (DULERA) 100-5 MCG/ACT AERO Inhale one puff each morning plus one extra dose at bedtime if coughing  . montelukast (SINGULAIR) 10 MG tablet Take 10 mg by mouth at bedtime.   . nebivolol (BYSTOLIC) 10 MG tablet Take 1 tablet (10 mg total) by mouth daily.  . pantoprazole (PROTONIX) 40 MG tablet Take 40 mg by mouth daily.    Marland Kitchen  Respiratory Therapy Supplies (FLUTTER) DEVI Use as directed  . traMADol (ULTRAM) 50 MG tablet 1-2 every 4 hours as needed for cough or pain  . albuterol (PROVENTIL HFA;VENTOLIN HFA) 108 (90 BASE) MCG/ACT inhaler Inhale 2 puffs into the lungs every 4 (four) hours as needed.    . bisoprolol (ZEBETA) 5 MG tablet Take 1/2 tablet daily    Allergy:  Allergies  Allergen Reactions  . Shellfish Allergy     HER THROAT SWELLS UP AND CLOSES  . Penicillins Rash    WHEN SHE WAS TEENAGER    Social Hx:   History   Social History  . Marital Status: Single    Spouse Name: N/A    Number of Children: 1 D  . Years of Education: N/A   Occupational History  . Retired    Social History Main Topics  . Smoking status: Former Smoker -- 1.00 packs/day for 1 years    Types: Cigarettes    Quit date: 02/11/1982  . Smokeless tobacco: Current User  . Alcohol Use: Yes     Comment: socially  . Drug Use: No  . Sexual Activity: Not on file   Other Topics Concern  . Not on file   Social History Narrative  . No narrative  on file  former smoker (quit in the 80's) Used to work teaching bar tending. Remote history of heavier drinking but "never a problem". She now drinks once every few months.  Past Surgical Hx:  Past Surgical History  Procedure Laterality Date  . Tonsillectomy  1971  . Abdominal ultrasound  Sept 2012    No gallbladder disease, + fatty liver  . Cardiac catheterization  10/25/2010    EF 60%; Normal coronaries  . Colonoscopy  10/18/2005  . Wh-mammography  11/24/2009  . Partial hysterectomy  1988  . Cosmetic surgery  2003    Past Medical Hx:  Past Medical History  Diagnosis Date  . Diabetes mellitus   . Asthma   . Overactive bladder   . Non-ST elevated myocardial infarction (non-STEMI) Sept 2012    Mild elevation in troponin and CK MB but no evidence of coronary disease at time of cath  . Elevated LFTs   . Obesity   . Hypertension   . Cirrhosis   . Portal hypertension   .  Glaucoma, narrow-angle     Past Gynecological History:  Hysterectomy for heavy periods (benign) in 1988.  No LMP recorded. Patient has had a hysterectomy.  Family Hx:  Family History  Problem Relation Age of Onset  . Asthma Mother   . Diabetes Mother   . Hypertension Mother   . Allergies Mother   . Rheum arthritis Mother   . Cancer Father     bladder  . Hypertension Father   . Stroke Father   . Breast cancer Sister   . Cancer Sister 35    breast  . Diabetes Brother   . Diabetes Sister     x 3  . Allergies Brother   . Allergies Sister   . Asthma Brother   . Asthma Sister     x 2   . Cancer Maternal Aunt     breast  . Cancer Cousin     breast (maternal)    Review of Systems:  Constitutional  Feels well,  See HPI  ENT Normal appearing ears and nares bilaterally Skin/Breast  No rash, sores, jaundice, itching, dryness Cardiovascular  No chest pain, shortness of breath, or edema  Pulmonary  No cough or wheeze.  Gastro Intestinal  No nausea, vomitting, or diarrhoea. No bright red blood per rectum, no abdominal pain, change in bowel movement, or constipation.  Genito Urinary  No frequency, urgency, dysuria, + stress incontinence. see HPI Musculo Skeletal  No myalgia, arthralgia, joint swelling or pain  Neurologic  No weakness, numbness, change in gait,  Psychology  No depression, anxiety, insomnia.   Vitals:  Blood pressure 161/66, pulse 53, temperature 97.6 F (36.4 C), temperature source Oral, resp. rate 20, height 5' 1.5" (1.562 m).  Physical Exam: WD in NAD Neck  Supple NROM, without any enlargements.  Lymph Node Survey No cervical supraclavicular or inguinal adenopathy Cardiovascular  Pulse normal rate, regularity and rhythm. S1 and S2 normal.  Lungs  Clear to auscultation bilateraly, without wheezes/crackles/rhonchi. Good air movement.  Skin  No rash/lesions/breakdown  Psychiatry  Alert and oriented to person, place, and time  Abdomen   Normoactive bowel sounds, abdomen soft, non-tender and overweight without evidence of hernia. Dullness to percussion. Back No CVA tenderness Genito Urinary  Vulva/vagina: Normal external female genitalia.  No lesions. No discharge or bleeding.  Bladder/urethra:  No lesions or masses, well supported bladder  Vagina: normal in appearance  Cervix: surgically absent  Uterus: surgically absent  Adnexa: fullness and firmness bilaterally in poorly defined masses. Rectal  There is substantial thickening and nodularity of the rectovaginal septum/cul de sac concerning for peritoneal metastases. Extremities  No bilateral cyanosis, clubbing or edema.   Donaciano Eva, MD   11/15/2013, 4:11 PM

## 2013-11-16 LAB — APTT: aPTT: 30 seconds (ref 24–37)

## 2013-11-18 ENCOUNTER — Ambulatory Visit (HOSPITAL_COMMUNITY)
Admission: RE | Admit: 2013-11-18 | Discharge: 2013-11-18 | Disposition: A | Discharge status: Home / Self Care | Payer: Medicare Other | Source: Ambulatory Visit | Attending: Interventional Radiology | Admitting: Interventional Radiology

## 2013-11-18 DIAGNOSIS — K760 Fatty (change of) liver, not elsewhere classified: Secondary | ICD-10-CM | POA: Diagnosis not present

## 2013-11-18 DIAGNOSIS — K668 Other specified disorders of peritoneum: Secondary | ICD-10-CM | POA: Diagnosis not present

## 2013-11-18 DIAGNOSIS — R188 Other ascites: Secondary | ICD-10-CM | POA: Insufficient documentation

## 2013-11-18 DIAGNOSIS — K7460 Unspecified cirrhosis of liver: Secondary | ICD-10-CM | POA: Insufficient documentation

## 2013-11-18 DIAGNOSIS — R971 Elevated cancer antigen 125 [CA 125]: Secondary | ICD-10-CM

## 2013-11-18 DIAGNOSIS — M7989 Other specified soft tissue disorders: Secondary | ICD-10-CM | POA: Insufficient documentation

## 2013-11-18 DIAGNOSIS — C577 Malignant neoplasm of other specified female genital organs: Secondary | ICD-10-CM | POA: Insufficient documentation

## 2013-11-18 NOTE — Procedures (Signed)
US guided diagnostic/therapeutic paracentesis performed yielding 4.1 liters yellow fluid. A portion of the fluid was sent to the lab for cytology. No immediate complications.

## 2013-11-22 ENCOUNTER — Telehealth: Payer: Self-pay | Admitting: Gynecologic Oncology

## 2013-11-22 ENCOUNTER — Other Ambulatory Visit: Payer: Self-pay | Admitting: Gynecologic Oncology

## 2013-11-22 ENCOUNTER — Telehealth: Payer: Self-pay | Admitting: *Deleted

## 2013-11-22 ENCOUNTER — Telehealth: Payer: Self-pay | Admitting: Oncology

## 2013-11-22 DIAGNOSIS — C569 Malignant neoplasm of unspecified ovary: Secondary | ICD-10-CM

## 2013-11-22 HISTORY — DX: Malignant neoplasm of unspecified ovary: C56.9

## 2013-11-22 NOTE — Telephone Encounter (Signed)
C/D 11/22/13 for Appt.12/01/13

## 2013-11-22 NOTE — Telephone Encounter (Signed)
I called "Olivia Benson" and let her know about her cytology results from the paracentesis. 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 Given her concerning Ct findings for progressive cirrhosis (signs of portal hypertension) and the high morbidity and potential mortality associated with major surgery performed in patients with cirrhosis, I am recommending a course of neoadjuvant chemotherapy with paclitaxel and carboplatin for 3 cycles, then review of her imaging and consideration for surgery (possibly via a minimally invasive route) at that time prior to completing 3 additional cycles of chemotherapy. She is agreeable to this plan.  I will have her see Dr Marko Plume from [facility] who specializes in chemotherapy for gynecologic cancers. I will also have her see her GI physicians through [facility] who she had been seeing previously, to optimize the status of her underlying cirrhosis and portal hypertension.  All questions answered.  Everitt Amber, MD

## 2013-11-22 NOTE — Telephone Encounter (Signed)
S/W PATIENT AND GAVE NP APPT FOR 10/21 @ 10:30 W/DR. [place], [place] EDU 10/15 @ 10.

## 2013-11-22 NOTE — Telephone Encounter (Signed)
Notified pt of scheduled appointment at [place] on Oct 28th @ 9:00. Pt agreed with time and date

## 2013-11-24 ENCOUNTER — Encounter: Payer: Self-pay | Admitting: Internal Medicine

## 2013-11-24 ENCOUNTER — Ambulatory Visit (INDEPENDENT_AMBULATORY_CARE_PROVIDER_SITE_OTHER): Payer: Medicare Other | Admitting: Internal Medicine

## 2013-11-24 VITALS — BP 112/68 | HR 61 | Ht 61.0 in | Wt 179.0 lb

## 2013-11-24 DIAGNOSIS — J45991 Cough variant asthma: Secondary | ICD-10-CM

## 2013-11-24 DIAGNOSIS — I10 Essential (primary) hypertension: Secondary | ICD-10-CM

## 2013-11-24 DIAGNOSIS — Z23 Encounter for immunization: Secondary | ICD-10-CM

## 2013-11-24 LAB — PULMONARY FUNCTION TEST
DL/VA % pred: 98 %
DL/VA: 4.34 ml/min/mmHg/L
DLCO unc % pred: 80 %
DLCO unc: 16.24 ml/min/mmHg
FEF 25-75 Post: 2.84 L/sec
FEF 25-75 Pre: 2.45 L/sec
FEF2575-%Change-Post: 15 %
FEF2575-%Pred-Post: 148 %
FEF2575-%Pred-Pre: 128 %
FEV1-%Change-Post: 4 %
FEV1-%Pred-Post: 95 %
FEV1-%Pred-Pre: 91 %
FEV1-Post: 2 L
FEV1-Pre: 1.93 L
FEV1FVC-%Change-Post: 4 %
FEV1FVC-%Pred-Pre: 109 %
FEV6-%Change-Post: 0 %
FEV6-%Pred-Post: 86 %
FEV6-%Pred-Pre: 86 %
FEV6-Post: 2.28 L
FEV6-Pre: 2.29 L
FEV6FVC-%Pred-Post: 104 %
FEV6FVC-%Pred-Pre: 104 %
FVC-%Change-Post: 0 %
FVC-%Pred-Post: 82 %
FVC-%Pred-Pre: 82 %
FVC-Post: 2.28 L
FVC-Pre: 2.29 L
Post FEV1/FVC ratio: 88 %
Post FEV6/FVC ratio: 100 %
Pre FEV1/FVC ratio: 84 %
Pre FEV6/FVC Ratio: 100 %
RV % pred: 70 %
RV: 1.38 L
TLC % pred: 84 %
TLC: 3.89 L

## 2013-11-24 NOTE — Addendum Note (Signed)
Addended by: Rosana Berger on: 11/24/2013 01:53 PM   Modules accepted: Orders

## 2013-11-24 NOTE — Patient Instructions (Addendum)
Dulera one each am but increase to Take 2 puffs first thing in am and then another 2 puffs about 12 hours later if any flare   singulair 10 mg daily should be continued   Whenever coughing at all >  Immediately start back on protonix 40 mg before bfast and pepcid at bedtime   If you are satisfied with your treatment plan,  let your doctor know and he/she can either refill your medications or you can return here when your prescription runs out.     If in any way you are not 100% satisfied,  please tell us.  If 100% better, tell your friends!  Pulmonary follow up is as needed

## 2013-11-24 NOTE — Progress Notes (Signed)
PFT done today. 

## 2013-11-24 NOTE — Progress Notes (Signed)
Subjective:    Patient ID: Olivia Benson, female    DOB: 01/15/1946  MRN: [id number]   Brief patient profile:  38 yowf quit smoking 1984 with h/o fall allergies s asthma then in ? 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 Pulmonary Consult    Referred per Dr Olivia Benson.  Pt c/o cough and dyspnea "all my life".  She states that she is SOB "with anything I do".  Cough is "deep" and non prod, coughs until the point of gagging. Cough is worse at night and first thing in the am.   decades of dry cough and sob seemed some better p quit smoking, cough seems to peak  at hs then goes to sleep but constant senstion of pnds and Uses saba up to sev times a day to control sob rec Change protonix Take 30-60 min before first meal of the day and pepcid 20 mg at bedtime along with chlorpheniramine 4 mg at bedtime Stop cozar and toprol and just take bystolic 10 mg one daily  Dulera 100 Take 2 puffs first thing in am and then another 2 puffs about 12 hours later. Only use your albuterol as rescue  GERD diet    09/15/2013 f/u ov/Olivia Benson re: cough since sept 2014 / hoarseness since started dulera  Chief Complaint  Patient presents with  . Follow-up    Cough and SOB are some better. She c/o hoarsenss that started right after her last visit. She states that hoarness starts mid morning and last until the evening. She has had to use albuterol inhaler x 2 since last visit.   cough in am's are  better, worse in evening after supper, no longer needs saba for sob rec Change protonix Take 30-60 min before first meal of the day and pepcid 20 mg at bedtime along with chlorpheniramine 4 mg at bedtime Continue  bystolic 10 mg one daily  continue Dulera 100 Take 2 puffs first thing in am and then another 2 puffs about 12 hours later.  For cough > use flutter valve,  Take delsym two tsp every 12 hours and supplement if needed with  tramadol 50 mg up to 2 every 4 hours   09/29/2013 f/u ov/Olivia Benson re: chronic cough  Chief Complaint  Patient presents with  . Follow-up    Pt states cough is improved since last OV--Pt reports that she has been using meds as Rx'd but has not been using Flutter Device. Pt c/o incr hoarseness.   Not limited by breathing from desired activities   rec Try off dulera to see what difference if any it makes For drainage take chlortrimeton (chlorpheniramine) 4 mg every 4 hours available over the counter (may cause drowsiness)  Continue by bystolic 10 mg   Only use flutter if you can't control the cough      11/24/2013 f/u ov/Olivia Benson re: cough variant asthma on dulera/singulair/gerd rx  Chief Complaint  Patient presents with  .  Follow-up    Pt states that her cough is much improved. 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   no need for any rescue on dulera 100 1 puff daily and no need for cough med   No obvious day to day or daytime variabilty or assoc cp or chest tightness, subjective wheeze overt sinus or hb symptoms. No unusual exp hx or h/o childhood pna/ asthma or knowledge of premature birth.  Sleeping ok without nocturnal  or early am exacerbation  of respiratory  c/o's or need for noct saba. Also denies any obvious fluctuation of symptoms with weather or environmental changes or other aggravating or alleviating factors except as outlined above   Current Medications, Allergies, Complete Past Medical History, Past Surgical History, Family History, and Social  History were reviewed in [employer] record.  ROS  The following are not active complaints unless bolded sore throat, dysphagia, dental problems, itching, sneezing,  nasal congestion or excess/ purulent secretions, ear ache,   fever, chills, sweats, unintended wt loss, pleuritic or exertional cp, hemoptysis,  orthopnea pnd or leg swelling, presyncope, palpitations, heartburn, abdominal pain, anorexia, nausea, vomiting, diarrhea  or change in bowel or urinary habits, change in stools or urine, dysuria,hematuria,  rash, arthralgias, visual complaints, headache, numbness weakness or ataxia or problems with walking or coordination,  change in mood/affect or memory.                        Objective:   Physical Exam  amb wf nad with no longer cough breathing out at all   09/15/2013         205 >  09/29/2013  194 > 11/24/2013 179  Wt Readings from Last 3 Encounters:  09/01/13 207 lb (93.895 kg)  10/22/12 205 lb (92.987 kg)  10/07/11 197 lb 12.8 oz (89.721 kg)     HEENT: nl dentition, turbinates, and orophanx. Nl external ear canals without cough reflex   NECK :  without JVD/Nodes/TM/ nl carotid upstrokes bilaterally   LUNGS: no acc muscle use, clear to A and P bilaterally with now no cough on exp     CV:  RRR  no s3 or murmur or increase in P2, no edema   ABD:  soft and nontender with nl excursion in the supine position. No bruits or organomegaly, bowel sounds nl  MS:  warm without deformities, calf tenderness, cyanosis or clubbing  SKIN: warm and dry without lesions    NEURO:  alert, approp, no deficits   cxr July 14 2013 "OK"       Assessment & Plan:

## 2013-11-24 NOTE — Assessment & Plan Note (Signed)
-  09/15/2013 p extensive coaching HFA effectiveness =    90%  - added flutter valve 09/15/13  - trial off dulera due to hoarseness 09/29/2013 > worse cough so resumed and controlled with dulera 100 one q am  - pfts 11/24/2013 wnl   All goals of chronic asthma control met including normal lung function and elimination of symptoms with minimal need for rescue therapy.  Contingencies discussed in full including contacting this office immediately if not controlling the symptoms using the rule of two's.     Probably can stop gerd rx at this point but continue singulair for now since may be helped with seasonal symptoms of rhinitis and cough    Each maintenance medication was reviewed in detail including most importantly the difference between maintenance and as needed and under what circumstances the prns are to be used.  Please see instructions for details which were reviewed in writing and the patient given a copy.

## 2013-11-24 NOTE — Addendum Note (Signed)
Addended by: Christinia Gully B on: 11/24/2013 01:51 PM   Modules accepted: Orders

## 2013-11-24 NOTE — Assessment & Plan Note (Signed)
Try off cozar and toprol due to ? uacs/asthma> bystolic started 7/84/6962   Can also use bisoprolol 5 mg take one half daily as generic subsitute

## 2013-11-25 ENCOUNTER — Other Ambulatory Visit: Payer: Medicare Other

## 2013-11-26 ENCOUNTER — Ambulatory Visit: Payer: Medicare Other

## 2013-11-26 ENCOUNTER — Other Ambulatory Visit: Payer: Medicare Other

## 2013-11-26 ENCOUNTER — Ambulatory Visit: Payer: Medicare Other | Admitting: Oncology

## 2013-11-28 ENCOUNTER — Other Ambulatory Visit: Payer: Self-pay | Admitting: Oncology

## 2013-11-28 DIAGNOSIS — C569 Malignant neoplasm of unspecified ovary: Secondary | ICD-10-CM

## 2013-12-01 ENCOUNTER — Ambulatory Visit: Payer: Medicare Other

## 2013-12-01 ENCOUNTER — Other Ambulatory Visit (HOSPITAL_BASED_OUTPATIENT_CLINIC_OR_DEPARTMENT_OTHER): Payer: Medicare Other

## 2013-12-01 ENCOUNTER — Encounter: Payer: Self-pay | Admitting: Oncology

## 2013-12-01 ENCOUNTER — Telehealth: Payer: Self-pay | Admitting: Oncology

## 2013-12-01 ENCOUNTER — Ambulatory Visit (HOSPITAL_BASED_OUTPATIENT_CLINIC_OR_DEPARTMENT_OTHER): Payer: Medicare Other | Admitting: Oncology

## 2013-12-01 ENCOUNTER — Telehealth: Payer: Self-pay | Admitting: *Deleted

## 2013-12-01 VITALS — BP 144/70 | HR 56 | Temp 98.4°F | Resp 18 | Ht 61.0 in | Wt 182.7 lb

## 2013-12-01 DIAGNOSIS — Z8052 Family history of malignant neoplasm of bladder: Secondary | ICD-10-CM

## 2013-12-01 DIAGNOSIS — C569 Malignant neoplasm of unspecified ovary: Secondary | ICD-10-CM

## 2013-12-01 DIAGNOSIS — C801 Malignant (primary) neoplasm, unspecified: Secondary | ICD-10-CM

## 2013-12-01 DIAGNOSIS — R161 Splenomegaly, not elsewhere classified: Secondary | ICD-10-CM

## 2013-12-01 DIAGNOSIS — N393 Stress incontinence (female) (male): Secondary | ICD-10-CM

## 2013-12-01 DIAGNOSIS — E119 Type 2 diabetes mellitus without complications: Secondary | ICD-10-CM

## 2013-12-01 DIAGNOSIS — I10 Essential (primary) hypertension: Secondary | ICD-10-CM

## 2013-12-01 DIAGNOSIS — R05 Cough: Secondary | ICD-10-CM

## 2013-12-01 DIAGNOSIS — R180 Malignant ascites: Secondary | ICD-10-CM

## 2013-12-01 DIAGNOSIS — K7460 Unspecified cirrhosis of liver: Secondary | ICD-10-CM

## 2013-12-01 DIAGNOSIS — Z803 Family history of malignant neoplasm of breast: Secondary | ICD-10-CM

## 2013-12-01 LAB — COMPREHENSIVE METABOLIC PANEL (CC13)
ALT: 26 U/L (ref 0–55)
AST: 30 U/L (ref 5–34)
Albumin: 3.2 g/dL — ABNORMAL LOW (ref 3.5–5.0)
Alkaline Phosphatase: 137 U/L (ref 40–150)
Anion Gap: 5 mEq/L (ref 3–11)
BUN: 9.8 mg/dL (ref 7.0–26.0)
CO2: 26 mEq/L (ref 22–29)
Calcium: 9 mg/dL (ref 8.4–10.4)
Chloride: 107 mEq/L (ref 98–109)
Creatinine: 0.9 mg/dL (ref 0.6–1.1)
Glucose: 122 mg/dl (ref 70–140)
Potassium: 3.9 mEq/L (ref 3.5–5.1)
Sodium: 138 mEq/L (ref 136–145)
Total Bilirubin: 0.49 mg/dL (ref 0.20–1.20)
Total Protein: 8.5 g/dL — ABNORMAL HIGH (ref 6.4–8.3)

## 2013-12-01 LAB — CBC WITH DIFFERENTIAL/PLATELET
BASO%: 0.6 % (ref 0.0–2.0)
Basophils Absolute: 0 10*3/uL (ref 0.0–0.1)
EOS%: 2 % (ref 0.0–7.0)
Eosinophils Absolute: 0.1 10*3/uL (ref 0.0–0.5)
HCT: 41.2 % (ref 34.8–46.6)
HGB: 12.9 g/dL (ref 11.6–15.9)
LYMPH%: 24.6 % (ref 14.0–49.7)
MCH: 26.4 pg (ref 25.1–34.0)
MCHC: 31.3 g/dL — ABNORMAL LOW (ref 31.5–36.0)
MCV: 84.5 fL (ref 79.5–101.0)
MONO#: 0.3 10*3/uL (ref 0.1–0.9)
MONO%: 5.1 % (ref 0.0–14.0)
NEUT#: 4.4 10*3/uL (ref 1.5–6.5)
NEUT%: 67.7 % (ref 38.4–76.8)
Platelets: 219 10*3/uL (ref 145–400)
RBC: 4.88 10*6/uL (ref 3.70–5.45)
RDW: 19.7 % — ABNORMAL HIGH (ref 11.2–14.5)
WBC: 6.4 10*3/uL (ref 3.9–10.3)
lymph#: 1.6 10*3/uL (ref 0.9–3.3)

## 2013-12-01 MED ORDER — DEXAMETHASONE 4 MG PO TABS: ORAL_TABLET | ORAL | Status: DC

## 2013-12-01 MED ORDER — LORAZEPAM 1 MG PO TABS: ORAL_TABLET | ORAL | Status: DC

## 2013-12-01 MED ORDER — ONDANSETRON HCL 8 MG PO TABS: 8.0000 mg | ORAL_TABLET | Freq: Three times a day (TID) | ORAL | Status: DC | PRN

## 2013-12-01 NOTE — Progress Notes (Signed)
chcked in new pateint with no financial issues prior to seeing the dr. She has prim/secon. She has appt card and has not been out of [place].

## 2013-12-01 NOTE — Telephone Encounter (Signed)
spoke with pt gave time for 10/27 appt

## 2013-12-01 NOTE — Progress Notes (Signed)
[facility] NEW PATIENT EVALUATION   Name: Olivia Benson Date: 12/01/2013 MRN: [id number] DOB: 27-Nov-1945  REFERRING PHYSICIAN: Everitt Amber  cc Helane Rima, MD (PCP), Luvenia Starch.Wert, P.Nahser, M.Stark/ J.Mann    REASON FOR REFERRAL: high grade serous gyn carcinoma, clinically IIIC ovarian, for consideration of neoadjuvant chemotherapy   HISTORY OF PRESENT ILLNESS:Olivia Benson is a 68 y.o. 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  Patient initially had problems in ~ July 2015 with stress urinary incontinence related to severe chronic cough. Urine culture by PCP showed no infection and she was referred to Dr Clois Comber, who adjusted medications with great improvement in the cough. Despite improved cough, she continued with some urinary incontinence. She saw Dr Ronita Hipps in 10-2013, with pelvic mass on exam and CA 125 1195 on 11-08-13. She had CT AP 11-12-13, which showed large ascites, irregular and shrunken liver, mild splenomegaly, increased soft tissue in pelvis bilaterally and anterior mid and lower peritoneum, small left pleural effusion. She saw Dr Denman George in consultation on 11-15-13, who agreed that this was likely gyn malignancy. She had US paracentesis of 4.1 liters on 11-18-13, with cytology ([id number]) and immunohistochemical stains consistent with serous carcinoma of Mullerian tract primary, favor ovarian. Patient had improvement in abdominal fullness after that paracentesis, but can tell that fluid is accumulating again. Dr Denman George recommends initial treatment with chemotherapy; patient attended chemotherapy education class prior to visit today. Other than abdominal fullness and the stress urinary incontinence, she really  feels better overall than she has in probably years with  improvement in cough. Only pain is left low back x 1 year, unchanged.  REVIEW OF SYSTEMS as above, also: Occasional HA seems sinus related and improved with chlortrimeton by Dr [name]. Good visual acuity with glasses, has narrow angle glaucoma [facility]). Had dental exam with cleaning in July. No known thyroid problems. Prior to Dr Gustavus Bryant care, she had deep hacking chronic cough. Checks blood sugars daily at home. No changes in breasts on self exam. No GERD.  Left low back pain x 1 year, wakes from sleep. Muscle cramps in legs recently, better with muscle relaxant by PCP. No other arthritis symptoms. No LE swelling Has been on estrace by Dr Ronita Hipps x years, begun for hot flashes and had recurrent significant hot flashes when she last tried to DC this.  Remainder of full 10 point review of systems negative.   ALLERGIES: Shellfish allergy and Penicillins  PAST MEDICAL/ SURGICAL HISTORY:    Scans 2013 with cirrhotic findings, possibly fatty liver, LFTs improved. ? Portal HTN  TAH 1988 for heavy bleeding, unable to do appendectomy then due to tortuous colon Cardiac cath 1012 normal coronaries, EF 60% nonSTEMI 10-2010 HTN Narrow angle glaucoma DM on metformin since 2012, follows BS at home, Hgb A1c 11-26-13:   6.2 Tortuous colon also at colonoscopy by Dr Collene Mares Bilateral breast implants remotely  CURRENT MEDICATIONS: reviewed as listed now in EMR.  Prescriptions sent to pharmacy for decadron, zofran, ativan.  Discussed that it would be best to stop Estrace, which may require gradual taper;   PHARMACY: [facility] [place]   SOCIAL HISTORY:  From [place], siblings and grown grandson and granddaughter also local. One son deceased 21 years ago. Not married, gentleman with her  is significant other. Smoked 1 ppd x "several years" DCd 1984. Last ETOH was a beer on her birthday in [birth year], denies past heavy ETOH. 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    FAMILY HISTORY:   Father with bladder ca, heavy smoker. Also HTN, CVA Mother DM, asthma Sister premenopausal breast ca, now 4 years out without known disease 4 younger sisters 57 brother 1 son deceased Grandson age 32 Granddaughter age 56 Great grandson age 45         PHYSICAL EXAM:  height is 5' 1" (1.549 m) and weight is 182 lb 11.2 oz (82.872 kg). Her oral temperature is 98.4 F (36.9 C). Her blood pressure is 144/70 and her pulse is 56. Her respiration is 18.  Alert, pleasant, cooperative lady looks comfortable, excellent historian, easily mobile, respirations not labored RA. Significant other very supportive.  HEENT: normal hair pattern. PERRL, not icteric. Oral mucosa moist and clear. Neck supple without JVD or thyroid mass.  RESPIRATORY: lungs clear to A and P to bases, no use of accessory muscles  CARDIAC/ VASCULAR: heart RRR no gallop, clear heart sounds. Peripheral pulses intact  ABDOMEN: full thruout, not tightly distended, not tender including epigastrium. Cannot appreciate HSM or mass. Bowel sounds present. Surgical incision well healed. No periumbilical nodes.  LYMPH NODES:no cervical, supraclavicular, axillary or inguinal nodes.  BREASTS: bilateral scarring from augmentation surgery. No dominant mass, no skin or nipple findings of concern  NEUROLOGIC: speech fluent, fully oriented. CN, motor, sensory, cerebellar appear intact.Marland Kitchen PSYCH appropriate mood and affect  SKIN: no rash, ecchymoses, petechiae or spider angiomatas  MUSCULOSKELETAL: spine not tender. Extremities without pitting edema, cords, tenderness. No clubbing or cyanosis    LABORATORY DATA:  Results for orders placed in visit on 12/01/13 (from the past 48 hour(s))  CBC WITH DIFFERENTIAL     Status: Abnormal   Collection Time    12/01/13 10:42 AM      Result Value Ref Range   WBC 6.4  3.9 - 10.3 10e3/uL   NEUT# 4.4   1.5 - 6.5 10e3/uL   HGB 12.9  11.6 - 15.9 g/dL   HCT 41.2  34.8 - 46.6 %   Platelets 219  145 - 400 10e3/uL   MCV 84.5  79.5 - 101.0 fL   MCH 26.4  25.1 - 34.0 pg   MCHC 31.3 (*) 31.5 - 36.0 g/dL   RBC 4.88  3.70 - 5.45 10e6/uL   RDW 19.7 (*) 11.2 - 14.5 %   lymph# 1.6  0.9 - 3.3 10e3/uL   MONO# 0.3  0.1 - 0.9 10e3/uL   Eosinophils Absolute 0.1  0.0 - 0.5 10e3/uL   Basophils Absolute 0.0  0.0 - 0.1 10e3/uL   NEUT% 67.7  38.4 - 76.8 %   LYMPH% 24.6  14.0 - 49.7 %   MONO% 5.1  0.0 - 14.0 %   EOS% 2.0  0.0 - 7.0 %   BASO% 0.6  0.0 - 2.0 %     CMET available after visit electrolytes normal, creat 0.9, glucose 122, Tbili 0.49, AP 137, AST 30, ALT 26, T prot 8.5, alb 3.2, Ca 9  PT/INR 11-15-13 15.6 and 1.3, PTT 30  CA125 available after visit 1938 by new lab method  PATHOLOGY: : [name], [place]: 11/18/2013 Client: [facility] Accession: [id number] Received: 11/18/2013 Rowe Robert, PA-C CYTOPATHOLOGY REPORT Adequacy Reason Satisfactory For Evaluation. Diagnosis PERITONEAL/ASCITIC FLUID (SPECIMEN 1  OF 1 COLLECTED 11/18/13): SEROUS CARCINOMA, PLEASE SEE COMMENT. Gretel Acre LI MD Pathologist, Electronic Signature (Case signed 11/22/2013) Specimen Clinical Information elevated CA 125 tumor marker, peritoneal thickening, concern for stage III ovarian cancer in the setting of CT findings of cirrhosis Source Peritoneal/Ascitic Fluid, (specimen 1 of 1 collected 11/18/13) Gross Specimen: Received is/are 2,000cc's of tan fluid. (BS:bs) Prepared: # Smears: 0 # Concentration Technique Slides (i.e. ThinPrep): 1 # Cell Block: 1 Additional Studies: n/a Comment Immunostains were performed and the tumor cells show the following immunoprofile: CK-7: positive WT-1: positive p53: positive ER: patchy positive p16: patchy positive CK-20: negative Controls are appropriate. The findings are diagnostic for serous caricnoma of Muelleria tract primary and ovarian primary  is favored.  RADIOGRAPHY: CT ABDOMEN AND PELVIS WITH CONTRAST 11-12-13  COMPARISON: Abdominal CT scan of August 17, 2011.  FINDINGS:  There is a large amount of ascites present. The liver is shrunken  and its surface is markedly irregular. There is mild splenomegaly.  There prominence of mesenteric venous structures. The gallbladder is  contracted. The pancreas, adrenal glands, and kidneys exhibit no  acute abnormalities. There is mild midpole cortical atrophy of the  left kidney which is stable. The caliber of the abdominal aorta is  normal. There is a retroaortic left renal vein.  The stomach, small, and large bowel exhibit no evidence of ileus nor  of obstruction. Within the pelvis the urinary bladder is partially  distended and grossly normal. There is increased soft tissue density  within the pelvis fairly symmetric from right to left. This could  reflect mesenteric implants in the appropriate clinical setting.  There are similar findings noted along the anterior mid and lower  peritoneal surface on the left. No definite focal adnexal masses are  demonstrated. There is no inguinal nor umbilical hernia.  There is a small left pleural effusion layering posteriorly. There  is subsegmental atelectasis at the left lung base. There are  bilateral breast implants. The cardiac chambers are normal in size  were visualized.  The lumbar spine and bony pelvis exhibit no acute abnormalities.  There is a prominent Schmorl's node in the body of L3 and there is  disc space narrowing at L3-4 and at L4-5.  IMPRESSION:  1. There are findings of progressive hepatic cirrhosis with a large  amount of ascites and mild splenomegaly. There are venous  collaterals present.  2. There is abnormal thickening of the peritoneal surface along the  left mid and lower abdominal wall. There is abnormal soft tissue  density in the pelvis as well which could reflect the clinically  suspected ovarian malignancy but  the findings are nondiagnostic.  3. There is a new small left pleural effusion.  4. There is no acute bowel abnormality.   ULTRASOUND GUIDED DIAGNOSTIC AND THERAPEUTIC PARACENTESIS 11-18-13 .  PROCEDURE:  An ultrasound guided paracentesis was thoroughly discussed with the  patient and questions answered. The benefits, risks, alternatives  and complications were also discussed. The patient understands and  wishes to proceed with the procedure. Written consent was obtained.  Ultrasound was performed to localize and mark an adequate pocket of  fluid in the left lower quadrant of the abdomen. The area was then  prepped and draped in the normal sterile fashion. 1% Lidocaine was  used for local anesthesia. Under ultrasound guidance a 19 gauge Yueh  catheter was introduced. Paracentesis was performed. The catheter  was removed and a dressing applied.  Complications: None.  FINDINGS:  A total of  approximately 4.1 liters of yellow fluid was removed. A  fluid sample was sent for cytology.  IMPRESSION:  Successful ultrasound guided diagnostic and therapeutic paracentesis  yielding 4.1 liters of ascites        DISCUSSION: we have discussed all of information above, including circumstances surrounding diagnosis, pathology and scan findings, and recommendation for intial treatment with chemotherapy. We have discussed pathophysiology of malignant ascites and prn paracenteses until hopefully chemotherapy improves that problem. She understands that she likely will go to interval debulking surgery if she has good response to chemotherapy. We have discussed outpatient chemotherapy treatment, mechanism of action of the agents, need to premed steroids to lessen chance of taxol reaction, antiemetics. She understands that she may have transient aches from taxol which can be severe at times. Peripheral IV access looks adequate for treatment, at least to begin, and she is aware that good hydration prior to IV  access can be helpful. She does want to have paracentesis repeated in next few days for comfort. She knows that she can contact this office at any time if questions or concerns. She will keep appointment with Dr Fuller Plan as scheduled by Dr Denman George, as reevaluation of cirrhosis likely will be useful information as we proceed with treatment of the gyn cancer.     IMPRESSION / PLAN:  1.high grade serous gyn carcinoma, clinically IIIC and possibly ovarian primary: only mildly to moderately symptomatic, from malignant ascites which is reaccumulating since paracentesis on 11-18-13. Will begin neoadjuvant chemotherapy with carboplatin and taxol within the week. Even with cirrhotic changes, her LFTs are still reasonable for taxol. I will see her back with labs ~ a week after first treatment. 2.cirrhosis of liver by scans, with mild splenomegaly/ suggestion of portal HTN: she had some evaluation ~ 2013 by Dr Fuller Plan. Appreciate his recommendations in this regard when he sees her again next week 3.past tobacco 4.diabetes on oral agent. Good blood sugar control, monitors at home. She is aware that blood sugars may increase around decadron needed with chemo. Will check blood sugar with treatment and cover with SS insulin/ additional IVF if needed. 5.stress urinary incontinence likely related to pelvic mass 6.chronic cough much better with medication adjustments by Dr [name] 7.narrow angle glaucoma 8.HTN 9.post TAH 1988 for benign indications 10. 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 Will try to assist her in getting off of supplemental estrogen     Patient and accompanying individual have had questions answered to their satisfaction and are in agreement with plan above. They can contact this office for questions or concerns at any time prior to next scheduled visit.  Time spent  >60 Benson, including >50% discussion and coordination of  care.  Chemo orders entered. Prior auth and consent done. CC this note to other MDs involved.  LIVESAY,LENNIS P, MD 12/01/2013 11:12 AM

## 2013-12-01 NOTE — Patient Instructions (Signed)
We will send prescriptions to your pharmacy   1.decadron (dexamethasone, steroid) 4 mg. Take five tablets +(=20 mg) with food 12 hrs before taxol chemotherapy and five tablets with food 6 hrs before taxol   2.zofran (ondansetron) 32m One tablet every 8 hrs as needed for nausea. Will not make you drowsy. Fine to take one tablet AM after chemo whether or not any nausea then, to extend coverage for nausea a bit longer. Other than that dose, fine to take just as needed for nausea   3.ativan (lorazepam) 0.5 mg. One tablet swallow or dissolve under tongue every 6 hrs as needed for nausea. WIll make you drowsy and a little forgetful around each dose. Fine to take one tablet at bedtime night of chemo whether or not any nausea.   You can call any time if needed [phone]

## 2013-12-01 NOTE — Telephone Encounter (Signed)
Per staff message and POF I have scheduled appts. Advised scheduler of appts. JMW  

## 2013-12-01 NOTE — Telephone Encounter (Signed)
per pof to sch pt appt-gave pt copy of sch-sent MW email to sch trmt-pt has MY CHART & will review

## 2013-12-02 ENCOUNTER — Encounter: Payer: Self-pay | Admitting: Oncology

## 2013-12-02 ENCOUNTER — Encounter: Payer: Self-pay | Admitting: *Deleted

## 2013-12-02 LAB — CA 125(PREVIOUS METHOD): CA 125: 1429.2 U/mL — ABNORMAL HIGH (ref 0.0–30.2)

## 2013-12-02 LAB — CA 125: CA 125: 1938 U/mL — ABNORMAL HIGH (ref <35)

## 2013-12-02 NOTE — Progress Notes (Signed)
Faxed ondansetron pa form to [facility]

## 2013-12-02 NOTE — Progress Notes (Signed)
Faxed lorazepam pa form to [employer]

## 2013-12-02 NOTE — Progress Notes (Signed)
RECEIVED FAXES FROM [facility] CONCERNING PRIOR AUTHORIZATIONS FOR LORAZEPAM AND ONDANSETRON. THESE REQUESTS WERE PLACED IN THE MANAGED CARE BIN.

## 2013-12-03 ENCOUNTER — Other Ambulatory Visit: Payer: Self-pay | Admitting: Oncology

## 2013-12-03 ENCOUNTER — Telehealth: Payer: Self-pay

## 2013-12-03 ENCOUNTER — Ambulatory Visit (HOSPITAL_COMMUNITY)
Admission: RE | Admit: 2013-12-03 | Discharge: 2013-12-03 | Disposition: A | Discharge status: Home / Self Care | Payer: Medicare Other | Source: Ambulatory Visit | Attending: Oncology | Admitting: Oncology

## 2013-12-03 DIAGNOSIS — Z794 Long term (current) use of insulin: Secondary | ICD-10-CM | POA: Insufficient documentation

## 2013-12-03 DIAGNOSIS — C569 Malignant neoplasm of unspecified ovary: Secondary | ICD-10-CM | POA: Insufficient documentation

## 2013-12-03 DIAGNOSIS — R180 Malignant ascites: Secondary | ICD-10-CM

## 2013-12-03 DIAGNOSIS — E1165 Type 2 diabetes mellitus with hyperglycemia: Secondary | ICD-10-CM

## 2013-12-03 DIAGNOSIS — E1140 Type 2 diabetes mellitus with diabetic neuropathy, unspecified: Secondary | ICD-10-CM | POA: Insufficient documentation

## 2013-12-03 DIAGNOSIS — R188 Other ascites: Secondary | ICD-10-CM | POA: Diagnosis present

## 2013-12-03 NOTE — Telephone Encounter (Signed)
Message copied by Baruch Merl on Fri Dec 03, 2013  1:24 PM ------      Message from: Gordy Levan      Created: Fri Dec 03, 2013  8:45 AM       For first [place] taxol on 10-27, every 3 weeks. I believe also for therapeutic paracentesis 10-23       Please speak with her by phone to review steroid premeds and nausea meds. She was very anxious about chemo, very nice lady.            thanks ------

## 2013-12-03 NOTE — Procedures (Signed)
US guided therapeutic paracentesis performed yielding 3.6 liters yellow fluid. No immediate complications.

## 2013-12-03 NOTE — Telephone Encounter (Signed)
Reviewed taking decadron premedication with Ms. [name]. She is to take 5 tabs(74m) with food Monday 12-06-13 at 9pm and 5 more tablets 3 am on 12-07-13 as [facility]  Patient verbalized understanding. Told her that the Zofran and Ativan medications for nausea require pre authorization with her insurance.   Will monitor this process and let her pharmacy know when medications approved so prescriptions can be filled. Patient verbalized understanding.

## 2013-12-06 ENCOUNTER — Other Ambulatory Visit: Payer: Medicare Other

## 2013-12-06 ENCOUNTER — Other Ambulatory Visit: Payer: Self-pay | Admitting: *Deleted

## 2013-12-06 MED ORDER — PROCHLORPERAZINE MALEATE 10 MG PO TABS: 10.0000 mg | ORAL_TABLET | Freq: Four times a day (QID) | ORAL | Status: DC | PRN

## 2013-12-07 ENCOUNTER — Telehealth: Payer: Self-pay

## 2013-12-07 ENCOUNTER — Ambulatory Visit (HOSPITAL_BASED_OUTPATIENT_CLINIC_OR_DEPARTMENT_OTHER): Payer: Medicare Other

## 2013-12-07 ENCOUNTER — Telehealth: Payer: Self-pay | Admitting: Emergency Medicine

## 2013-12-07 ENCOUNTER — Other Ambulatory Visit (HOSPITAL_BASED_OUTPATIENT_CLINIC_OR_DEPARTMENT_OTHER): Payer: Medicare Other

## 2013-12-07 VITALS — BP 130/69 | HR 61 | Temp 97.5°F | Resp 18 | Ht 61.0 in

## 2013-12-07 DIAGNOSIS — C569 Malignant neoplasm of unspecified ovary: Secondary | ICD-10-CM

## 2013-12-07 DIAGNOSIS — Z5111 Encounter for antineoplastic chemotherapy: Secondary | ICD-10-CM

## 2013-12-07 DIAGNOSIS — C801 Malignant (primary) neoplasm, unspecified: Secondary | ICD-10-CM

## 2013-12-07 LAB — CBC WITH DIFFERENTIAL/PLATELET
BASO%: 0.5 % (ref 0.0–2.0)
Basophils Absolute: 0 10*3/uL (ref 0.0–0.1)
EOS%: 0.1 % (ref 0.0–7.0)
Eosinophils Absolute: 0 10*3/uL (ref 0.0–0.5)
HCT: 40.4 % (ref 34.8–46.6)
HGB: 12.9 g/dL (ref 11.6–15.9)
LYMPH%: 13.3 % — ABNORMAL LOW (ref 14.0–49.7)
MCH: 26.7 pg (ref 25.1–34.0)
MCHC: 31.8 g/dL (ref 31.5–36.0)
MCV: 83.9 fL (ref 79.5–101.0)
MONO#: 0 10*3/uL — ABNORMAL LOW (ref 0.1–0.9)
MONO%: 0.5 % (ref 0.0–14.0)
NEUT#: 5 10*3/uL (ref 1.5–6.5)
NEUT%: 85.6 % — ABNORMAL HIGH (ref 38.4–76.8)
Platelets: 198 10*3/uL (ref 145–400)
RBC: 4.81 10*6/uL (ref 3.70–5.45)
RDW: 19.6 % — ABNORMAL HIGH (ref 11.2–14.5)
WBC: 5.8 10*3/uL (ref 3.9–10.3)
lymph#: 0.8 10*3/uL — ABNORMAL LOW (ref 0.9–3.3)

## 2013-12-07 LAB — COMPREHENSIVE METABOLIC PANEL (CC13)
ALT: 25 U/L (ref 0–55)
AST: 27 U/L (ref 5–34)
Albumin: 3 g/dL — ABNORMAL LOW (ref 3.5–5.0)
Alkaline Phosphatase: 145 U/L (ref 40–150)
Anion Gap: 8 mEq/L (ref 3–11)
BUN: 10.8 mg/dL (ref 7.0–26.0)
CO2: 19 mEq/L — ABNORMAL LOW (ref 22–29)
Calcium: 8.9 mg/dL (ref 8.4–10.4)
Chloride: 109 mEq/L (ref 98–109)
Creatinine: 0.8 mg/dL (ref 0.6–1.1)
Glucose: 245 mg/dl — ABNORMAL HIGH (ref 70–140)
Potassium: 4.1 mEq/L (ref 3.5–5.1)
Sodium: 137 mEq/L (ref 136–145)
Total Bilirubin: 0.45 mg/dL (ref 0.20–1.20)
Total Protein: 8.1 g/dL (ref 6.4–8.3)

## 2013-12-07 MED ORDER — ONDANSETRON 16 MG/50ML IVPB (CHCC)
INTRAVENOUS | Status: AC
  Filled 2013-12-07: qty 16

## 2013-12-07 MED ORDER — DIPHENHYDRAMINE HCL 50 MG/ML IJ SOLN
50.0000 mg | Freq: Once | INTRAMUSCULAR | Status: AC
  Administered 2013-12-07: 50 mg via INTRAVENOUS

## 2013-12-07 MED ORDER — DEXAMETHASONE SODIUM PHOSPHATE 20 MG/5ML IJ SOLN
INTRAMUSCULAR | Status: AC
  Filled 2013-12-07: qty 5

## 2013-12-07 MED ORDER — FAMOTIDINE IN NACL 20-0.9 MG/50ML-% IV SOLN
20.0000 mg | Freq: Once | INTRAVENOUS | Status: AC
  Administered 2013-12-07: 20 mg via INTRAVENOUS

## 2013-12-07 MED ORDER — DEXAMETHASONE SODIUM PHOSPHATE 20 MG/5ML IJ SOLN
20.0000 mg | Freq: Once | INTRAMUSCULAR | Status: AC
  Administered 2013-12-07: 20 mg via INTRAVENOUS

## 2013-12-07 MED ORDER — DIPHENHYDRAMINE HCL 50 MG/ML IJ SOLN
INTRAMUSCULAR | Status: AC
  Filled 2013-12-07: qty 1

## 2013-12-07 MED ORDER — SODIUM CHLORIDE 0.9 % IV SOLN
Freq: Once | INTRAVENOUS | Status: AC
  Administered 2013-12-07: 09:00:00 via INTRAVENOUS

## 2013-12-07 MED ORDER — DEXTROSE 5 % IV SOLN
135.0000 mg/m2 | Freq: Once | INTRAVENOUS | Status: AC
  Administered 2013-12-07: 258 mg via INTRAVENOUS
  Filled 2013-12-07: qty 43

## 2013-12-07 MED ORDER — ONDANSETRON 16 MG/50ML IVPB (CHCC)
16.0000 mg | Freq: Once | INTRAVENOUS | Status: AC
  Administered 2013-12-07: 16 mg via INTRAVENOUS

## 2013-12-07 MED ORDER — FAMOTIDINE IN NACL 20-0.9 MG/50ML-% IV SOLN
INTRAVENOUS | Status: AC
  Filled 2013-12-07: qty 50

## 2013-12-07 MED ORDER — SODIUM CHLORIDE 0.9 % IV SOLN
389.6000 mg | Freq: Once | INTRAVENOUS | Status: AC
  Administered 2013-12-07: 390 mg via INTRAVENOUS
  Filled 2013-12-07: qty 39

## 2013-12-07 NOTE — Patient Instructions (Signed)
[facility] Discharge Instructions for Patients Receiving Chemotherapy  Today you received the following chemotherapy agents Taxol and Carboplatin.  To help prevent nausea and vomiting after your treatment, we encourage you to take your nausea medication.   If you develop nausea and vomiting that is not controlled by your nausea medication, call the clinic.   BELOW ARE SYMPTOMS THAT SHOULD BE REPORTED IMMEDIATELY:  *FEVER GREATER THAN 100.5 F  *CHILLS WITH OR WITHOUT FEVER  NAUSEA AND VOMITING THAT IS NOT CONTROLLED WITH YOUR NAUSEA MEDICATION  *UNUSUAL SHORTNESS OF BREATH  *UNUSUAL BRUISING OR BLEEDING  TENDERNESS IN MOUTH AND THROAT WITH OR WITHOUT PRESENCE OF ULCERS  *URINARY PROBLEMS  *BOWEL PROBLEMS  UNUSUAL RASH Items with * indicate a potential emergency and should be followed up as soon as possible.  Feel free to call the clinic you have any questions or concerns. The clinic phone number is [phone].

## 2013-12-07 NOTE — Telephone Encounter (Signed)
Charting error.

## 2013-12-08 ENCOUNTER — Ambulatory Visit (INDEPENDENT_AMBULATORY_CARE_PROVIDER_SITE_OTHER): Payer: Medicare Other | Admitting: Gastroenterology

## 2013-12-08 ENCOUNTER — Encounter: Payer: Self-pay | Admitting: Gastroenterology

## 2013-12-08 ENCOUNTER — Telehealth: Payer: Self-pay | Admitting: *Deleted

## 2013-12-08 VITALS — BP 134/68 | HR 64 | Ht 61.5 in | Wt 180.0 lb

## 2013-12-08 DIAGNOSIS — K7460 Unspecified cirrhosis of liver: Secondary | ICD-10-CM

## 2013-12-08 DIAGNOSIS — R180 Malignant ascites: Secondary | ICD-10-CM

## 2013-12-08 NOTE — Patient Instructions (Addendum)
Call us if you are going to get another paracentesis to make [place] aware of this information.  Follow up with [place] in 3 months.   I appreciate the opportunity to care for you.  Cc: Evlyn Clines, MD

## 2013-12-08 NOTE — Progress Notes (Signed)
    History of Present Illness: This is a 68 year old female with cirrhosis and portal hypertension likely secondary to [place]. She was last seen in 2013. 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 She is followed by Dr. Marko Plume and has just started neoadjuvant chemotherapy. She has had 2 paracenteses this month and the first was sent for cytology no other studies were sent. Her INR was 1.2 in 2013 and it is 1.3 now. Her albumin is 3.2 and the remainder of her liver tests are normal. CT scan earlier this month showed splenomegaly, ascites, venous collaterals and liver changes consistent with cirrhosis.    Current Medications, Allergies, Past Medical History, Past Surgical History, Family History and Social History were reviewed in [employer] record.  Physical Exam: General: Well developed , well nourished, no acute distress Head: Normocephalic and atraumatic Eyes:  sclerae anicteric, EOMI Ears: Normal auditory acuity Mouth: No deformity or lesions Lungs: Clear throughout to auscultation Heart: Regular rate and rhythm; no murmurs, rubs or bruits Abdomen: Soft, mild lower abdominal tenderness and mild distention. No masses, hepatosplenomegaly or hernias noted. Normal Bowel sounds Musculoskeletal: Symmetrical with no gross deformities  Pulses:  Normal pulses noted Extremities: No clubbing, cyanosis, edema or deformities noted Neurological: Alert oriented x 4, grossly nonfocal Psychological:  Alert and cooperative. Normal mood and affect  Assessment and Recommendations:  1. 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 Elevated ACE and other serologies negative. I doubt that she has sarcoidosis as she has undergone evaluation by Dr. [name]. Her liver disease appears to have progressed by imaging studies since 2013. Undergoing chemotherapy has the potential to lead to hepatic decompensation. It is difficult to tell  if there is any liver component to her ascites or if her ascites is simply malignant ascites. If she has another paracentesis performed fluid should be sent for cell counts, Gram stain, albumin, amylase and cultures.  A liver biopsy would provide more information but it is contraindicated in the presence of ascites. Discussed EGD to screen for varices and gastropathy however since she is starting chemotherapy she would like to defer for now. Low fat weight loss program. Return office in 3 months.  2. High grade serous gyn carcinoma, clinically IIIC ovarian and she has just started chemotherapy. Advise Dr. Marko Plume to closely monitor liver enzymes and PT/INR during her treatment.

## 2013-12-08 NOTE — Telephone Encounter (Signed)
Reviewed her antiemetics with her. She was able to verbalize the meds she has and how to take each.

## 2013-12-09 ENCOUNTER — Telehealth: Payer: Self-pay

## 2013-12-09 ENCOUNTER — Other Ambulatory Visit: Payer: Self-pay | Admitting: Oncology

## 2013-12-09 DIAGNOSIS — K760 Fatty (change of) liver, not elsewhere classified: Secondary | ICD-10-CM

## 2013-12-09 NOTE — Telephone Encounter (Signed)
[name] called that her face , neck and chest looks as though she has a sunburn. Skin slightly burning.  It started yesterday with just rosey cheeks.  The is no itching, welts, or raised areas. She was very hyper Tuesday night and could not sleep.  She is not hyper today and slept Wednesday night.  Told her that the steroids can causing flushing in the cheeks the day after a treatment.  Told her that I discussed symptoms with pharmacist Colleen at [facility].  She feels that it is flushing from the steroids  and will  diapate as the steroid gets out of her system.   Will let Dr. Marko Plume know of flushing.  [name] verbalized understanding.

## 2013-12-10 MED ORDER — TRAMADOL HCL 50 MG PO TABS: 50.0000 mg | ORAL_TABLET | Freq: Three times a day (TID) | ORAL | Status: DC | PRN

## 2013-12-10 NOTE — Telephone Encounter (Signed)
Spoke with Olivia Benson and she said the flushing is all gone.  The aches in her legs has increased from yesterday.  She has tried ibuprofen.  Suggested warm bath/ heating pad.  Sometimes nothing helps the pain.  Sent in som tramadol to pharmacy to see if this would take off the edge especially to help her sleep at night.  Olivia Benson verbalized understanding.

## 2013-12-10 NOTE — Addendum Note (Signed)
Addended by: Baruch Merl on: 12/10/2013 01:48 PM   Modules accepted: Orders

## 2013-12-11 ENCOUNTER — Other Ambulatory Visit: Payer: Self-pay | Admitting: Oncology

## 2013-12-13 ENCOUNTER — Telehealth: Payer: Self-pay | Admitting: Oncology

## 2013-12-13 ENCOUNTER — Ambulatory Visit (HOSPITAL_BASED_OUTPATIENT_CLINIC_OR_DEPARTMENT_OTHER): Payer: Medicare Other | Admitting: Oncology

## 2013-12-13 ENCOUNTER — Encounter: Payer: Self-pay | Admitting: Oncology

## 2013-12-13 ENCOUNTER — Other Ambulatory Visit (HOSPITAL_BASED_OUTPATIENT_CLINIC_OR_DEPARTMENT_OTHER): Payer: Medicare Other

## 2013-12-13 VITALS — BP 129/56 | HR 62 | Temp 97.4°F | Resp 18 | Ht 61.5 in | Wt 174.3 lb

## 2013-12-13 DIAGNOSIS — Z5189 Encounter for other specified aftercare: Secondary | ICD-10-CM

## 2013-12-13 DIAGNOSIS — C801 Malignant (primary) neoplasm, unspecified: Secondary | ICD-10-CM

## 2013-12-13 DIAGNOSIS — K7460 Unspecified cirrhosis of liver: Secondary | ICD-10-CM

## 2013-12-13 DIAGNOSIS — C569 Malignant neoplasm of unspecified ovary: Secondary | ICD-10-CM

## 2013-12-13 DIAGNOSIS — N393 Stress incontinence (female) (male): Secondary | ICD-10-CM

## 2013-12-13 DIAGNOSIS — K760 Fatty (change of) liver, not elsewhere classified: Secondary | ICD-10-CM

## 2013-12-13 DIAGNOSIS — R180 Malignant ascites: Secondary | ICD-10-CM

## 2013-12-13 DIAGNOSIS — R05 Cough: Secondary | ICD-10-CM

## 2013-12-13 DIAGNOSIS — E119 Type 2 diabetes mellitus without complications: Secondary | ICD-10-CM

## 2013-12-13 DIAGNOSIS — I10 Essential (primary) hypertension: Secondary | ICD-10-CM

## 2013-12-13 DIAGNOSIS — K766 Portal hypertension: Secondary | ICD-10-CM

## 2013-12-13 LAB — COMPREHENSIVE METABOLIC PANEL (CC13)
ALT: 74 U/L — ABNORMAL HIGH (ref 0–55)
AST: 73 U/L — ABNORMAL HIGH (ref 5–34)
Albumin: 3.4 g/dL — ABNORMAL LOW (ref 3.5–5.0)
Alkaline Phosphatase: 142 U/L (ref 40–150)
Anion Gap: 8 mEq/L (ref 3–11)
BUN: 13.7 mg/dL (ref 7.0–26.0)
CO2: 24 mEq/L (ref 22–29)
Calcium: 8.7 mg/dL (ref 8.4–10.4)
Chloride: 104 mEq/L (ref 98–109)
Creatinine: 1 mg/dL (ref 0.6–1.1)
Glucose: 113 mg/dl (ref 70–140)
Potassium: 3.9 mEq/L (ref 3.5–5.1)
Sodium: 135 mEq/L — ABNORMAL LOW (ref 136–145)
Total Bilirubin: 0.72 mg/dL (ref 0.20–1.20)
Total Protein: 8.3 g/dL (ref 6.4–8.3)

## 2013-12-13 LAB — CBC WITH DIFFERENTIAL/PLATELET
BASO%: 0.8 % (ref 0.0–2.0)
Basophils Absolute: 0 10*3/uL (ref 0.0–0.1)
EOS%: 2.4 % (ref 0.0–7.0)
Eosinophils Absolute: 0.1 10*3/uL (ref 0.0–0.5)
HCT: 38.6 % (ref 34.8–46.6)
HGB: 12.4 g/dL (ref 11.6–15.9)
LYMPH%: 40.5 % (ref 14.0–49.7)
MCH: 27 pg (ref 25.1–34.0)
MCHC: 32.1 g/dL (ref 31.5–36.0)
MCV: 83.9 fL (ref 79.5–101.0)
MONO#: 0 10*3/uL — ABNORMAL LOW (ref 0.1–0.9)
MONO%: 0.8 % (ref 0.0–14.0)
NEUT#: 1.4 10*3/uL — ABNORMAL LOW (ref 1.5–6.5)
NEUT%: 55.5 % (ref 38.4–76.8)
Platelets: 165 10*3/uL (ref 145–400)
RBC: 4.6 10*6/uL (ref 3.70–5.45)
RDW: 17.7 % — ABNORMAL HIGH (ref 11.2–14.5)
WBC: 2.5 10*3/uL — ABNORMAL LOW (ref 3.9–10.3)
lymph#: 1 10*3/uL (ref 0.9–3.3)

## 2013-12-13 LAB — PROTIME-INR
INR: 1.2 — ABNORMAL LOW (ref 2.00–3.50)
Protime: 14.4 Seconds — ABNORMAL HIGH (ref 10.6–13.4)

## 2013-12-13 MED ORDER — PEGFILGRASTIM INJECTION 6 MG/0.6ML
6.0000 mg | Freq: Once | SUBCUTANEOUS | Status: AC
  Administered 2013-12-13: 6 mg via SUBCUTANEOUS
  Filled 2013-12-13: qty 0.6

## 2013-12-13 MED ORDER — DEXAMETHASONE 4 MG PO TABS: ORAL_TABLET | ORAL | Status: DC

## 2013-12-13 NOTE — Patient Instructions (Signed)
Senokot S (stool softener + laxative) 1-2 tablets daily or miralax (glycolax) one capful daily if laxatives needed in addition to stool softener.  Claritin 10 mg (lortadine) daily, for taxol aches or aches from neulasta shot

## 2013-12-13 NOTE — Telephone Encounter (Signed)
per pof to sch pt appt-sch appt-sent [facility] emailt o see if 11/17 could be earlier appt-gave pt copy of sch

## 2013-12-13 NOTE — Progress Notes (Signed)
OFFICE PROGRESS NOTE   12/13/2013   Physicians:Emma Barrington Ellison, MD (PCP), Mardene Speak, M.Wert, P.Nahser, M.Stark/ J.Mann  INTERVAL HISTORY:  Patient is seen, together with significant other, in continuing attention to recently diagnosed high grade serous gyn carcinoma, clinically consistent with IIIC ovarian. She received first neoadjuvant chemotherapy with carboplatin and taxol on 12-07-13, doses reduced due to concern for cirrhosis. She had significant taxol aches beginning day 3, which are much better just today, and WBC is already dropping.  Patient was very energetic from steroids on day 2, when she was seen by Dr Fuller Plan. Stabbing taxol aches in lower body began day 3, minimally improved with ibuprofen and with tramadol. She had very little nausea. She was constipated, with no BM from 10-27 thru 12-12-13, then used stool softeners for several days, had multiple loose stools on 11-1, then took imodium. She went to work on 01-10-14, drank water and ate banana only, then was tremulous/ cold/ fatigued after returning home (did not check blood sugar). Appetite has been poor, tho friend has encouraged food intake. Blood sugar was ~ 240 day of chemo, but has been back in usual lower range since then. She does not feel that ascites is reaccumulating, last paracentesis fro 3.6 liters on 12-03-13 and previously for 4.1 liters on 11-18-13.  Per Dr Lynne Leader reevaluation, NASH is most likely cause of her cirrhosis and portal hypertension. Liver biopsy is contraindicated due to ascites and EGD to evaluate for varices deferred due to chemotherapy. If paracentesis is repeated, he requests cell count, gram stain, albumin, amylase and culture. He is concerned that chemotherapy could cause hepatic decompensation, suggests watching LFTs and coags.    She does not have PAC She had flu vaccine   ONCOLOGIC HISTORY Patient initially had problems in ~ July 2015 with stress urinary incontinence related to severe  chronic cough. Urine culture by PCP showed no infection and she was referred to Dr Clois Comber, who adjusted medications with great improvement in the cough. Despite improved cough, she continued with some urinary incontinence. She saw Dr Ronita Hipps in 10-2013, with pelvic mass on exam and CA 125 1195 on 11-08-13. She had CT AP 11-12-13, which showed large ascites, irregular and shrunken liver, mild splenomegaly, increased soft tissue in pelvis bilaterally and anterior mid and lower peritoneum, small left pleural effusion. She saw Dr Denman George in consultation on 11-15-13, who agreed that this was likely gyn malignancy. She had US paracentesis of 4.1 liters on 11-18-13, with cytology ([id number]) and immunohistochemical stains consistent with serous carcinoma of Mullerian tract primary, favor ovarian. She had second paracentesis for 3.6 liters on 12-03-13.  Gyn oncology recommended initial treatment with chemotherapy. She had first carboplatin and taxol on 12-07-13, with carboplatin dosed at AUC=4 and taxol at 135 mg/m2 due to concern for cirrhosis.   Review of systems as above, also: Slight throat irritation and some increase in cough, tho this is not nearly as deep and severe as prior to Dr Gustavus Bryant interventions. No sputum, no esophagitis, no sinus symptoms, no fever. No bleeding. Metallic taste. Bladder ok. No LE swelling. No peripheral neuropathy. Peripheral IV access accomplished without difficulty. Remainder of 10 point Review of Systems negative.  Objective:  Vital signs in last 24 hours:  BP 129/56 mmHg  Pulse 62  Temp(Src) 97.4 F (36.3 C) (Oral)  Resp 18  Ht 5' 1.5" (1.562 m)  Wt 174 lb 4.8 oz (79.062 kg)  BMI 32.40 kg/m2 Weight is down 6 lbs.  Alert, oriented and appropriate. Ambulatory  without assistance. In no distress. Mild coughing x1. No alopecia  HEENT:PERRL, sclerae not icteric. Oral mucosa moist without lesions, posterior pharynx clear.  Neck supple. No JVD.  Lymphatics:no  cervical,suraclavicular or inguinal adenopathy Resp: clear to auscultation bilaterally and normal percussion bilaterally Cardio: regular rate and rhythm. No gallop. GI: abdomen obese but softer and less distended especially in upper abdomen, not tender, no mass or organomegaly. Normally active bowel sounds.  Musculoskeletal/ Extremities: without pitting edema, cords, tenderness Neuro: no peripheral neuropathy. Otherwise nonfocal Skin without rash, ecchymosis, petechiae   Lab Results:  Results for orders placed or performed in visit on 12/13/13  CBC with Differential  Result Value Ref Range   WBC 2.5 (L) 3.9 - 10.3 10e3/uL   NEUT# 1.4 (L) 1.5 - 6.5 10e3/uL   HGB 12.4 11.6 - 15.9 g/dL   HCT 38.6 34.8 - 46.6 %   Platelets 165 145 - 400 10e3/uL   MCV 83.9 79.5 - 101.0 fL   MCH 27.0 25.1 - 34.0 pg   MCHC 32.1 31.5 - 36.0 g/dL   RBC 4.60 3.70 - 5.45 10e6/uL   RDW 17.7 (H) 11.2 - 14.5 %   lymph# 1.0 0.9 - 3.3 10e3/uL   MONO# 0.0 (L) 0.1 - 0.9 10e3/uL   Eosinophils Absolute 0.1 0.0 - 0.5 10e3/uL   Basophils Absolute 0.0 0.0 - 0.1 10e3/uL   NEUT% 55.5 38.4 - 76.8 %   LYMPH% 40.5 14.0 - 49.7 %   MONO% 0.8 0.0 - 14.0 %   EOS% 2.4 0.0 - 7.0 %   BASO% 0.8 0.0 - 2.0 %  Comprehensive metabolic panel (Cmet) - [facility]  Result Value Ref Range   Sodium 135 (L) 136 - 145 mEq/L   Potassium 3.9 3.5 - 5.1 mEq/L   Chloride 104 98 - 109 mEq/L   CO2 24 22 - 29 mEq/L   Glucose 113 70 - 140 mg/dl   BUN 13.7 7.0 - 26.0 mg/dL   Creatinine 1.0 0.6 - 1.1 mg/dL   Total Bilirubin 0.72 0.20 - 1.20 mg/dL   Alkaline Phosphatase 142 40 - 150 U/L   AST 73 (H) 5 - 34 U/L   ALT 74 (H) 0 - 55 U/L   Total Protein 8.3 6.4 - 8.3 g/dL   Albumin 3.4 (L) 3.5 - 5.0 g/dL   Calcium 8.7 8.4 - 10.4 mg/dL   Anion Gap 8 3 - 11 mEq/L  Protime-INR  Result Value Ref Range   Protime 14.4 (H) 10.6 - 13.4 Seconds   INR 1.20 (L) 2.00 - 3.50   Lovenox No     AST and ALT had been 27 and 25 on day of chemo  12-07-13  Studies/Results:  No results found.  Medications: I have reviewed the patient's current medications. She agrees to [employer] today, will begin claritin 10 mg daily today, as this may help gCSF (and taxol) aches. I mentioned senokot S and miralax if constipation; I told them that it is generally better to avoid imodium when bowels finally move well after episode of constipation. She will resume the other medications from Dr [name] for recurrent cough She will continue estrace for now, until she is more settled with the other medications, then we will try to DC.  DISCUSSION: patient understands that counts are not yet at nadir, so likely will be neutropenic in next week if no gCSF and may also be neutropenic hopefully for shorter time if gCSF used. Discussed possible aches from neulasta. Improvement in abdominal fullness this  soon after first chemo is encouraging, tho may be primarily related to good bowel movements in past 2 days.  Assessment/Plan: 1.high grade serous gyn carcinoma, clinically IIIC and possibly ovarian primary: only mildly to moderately symptomatic, from malignant ascites which is reaccumulating since paracentesis on 11-18-13. Neoadjuvant chemotherapy with carboplatin and taxol begun 12-07-13, drugs dose reduced due to concern re cirrhosis. Neulasta given today, still may become neutropenic in next week. I will see her with counts, chemistries and INR on 12-20-13. 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 Appreciate very helpful consultation by Dr Fuller Plan as above. 3.past tobacco 4.diabetes on oral agent. Reminded her to check blood sugars if she is not eating, told her ok to hold metformin if unable to eat. 5.stress urinary incontinence likely related to pelvic mass 6.chronic cough: better with medication adjustments by Dr [name] recently, slightly increased now, plan as above 7.narrow angle glaucoma 8.HTN 9.post TAH 1988 for  benign indications 10. 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 Will try to assist her in getting off of supplemental estrogen after rest of situation is more stable   Patient and friend know to call if questions or concerns prior to visit next week Time spent 30 min including >50% counseling and coordination of care.    LIVESAY,LENNIS P, MD   12/13/2013, 2:07 PM

## 2013-12-15 ENCOUNTER — Other Ambulatory Visit: Payer: Medicare Other

## 2013-12-17 ENCOUNTER — Telehealth: Payer: Self-pay

## 2013-12-17 ENCOUNTER — Ambulatory Visit (HOSPITAL_BASED_OUTPATIENT_CLINIC_OR_DEPARTMENT_OTHER): Payer: Medicare Other

## 2013-12-17 ENCOUNTER — Ambulatory Visit (HOSPITAL_BASED_OUTPATIENT_CLINIC_OR_DEPARTMENT_OTHER): Payer: Medicare Other | Admitting: Physician Assistant

## 2013-12-17 ENCOUNTER — Encounter: Payer: Self-pay | Admitting: Physician Assistant

## 2013-12-17 VITALS — BP 143/61 | HR 68 | Temp 98.3°F | Resp 16 | Wt 177.5 lb

## 2013-12-17 DIAGNOSIS — R180 Malignant ascites: Secondary | ICD-10-CM

## 2013-12-17 DIAGNOSIS — I10 Essential (primary) hypertension: Secondary | ICD-10-CM

## 2013-12-17 DIAGNOSIS — J40 Bronchitis, not specified as acute or chronic: Secondary | ICD-10-CM

## 2013-12-17 DIAGNOSIS — E119 Type 2 diabetes mellitus without complications: Secondary | ICD-10-CM

## 2013-12-17 DIAGNOSIS — C569 Malignant neoplasm of unspecified ovary: Secondary | ICD-10-CM

## 2013-12-17 DIAGNOSIS — C801 Malignant (primary) neoplasm, unspecified: Secondary | ICD-10-CM

## 2013-12-17 DIAGNOSIS — R05 Cough: Secondary | ICD-10-CM

## 2013-12-17 DIAGNOSIS — K7460 Unspecified cirrhosis of liver: Secondary | ICD-10-CM

## 2013-12-17 DIAGNOSIS — R161 Splenomegaly, not elsewhere classified: Secondary | ICD-10-CM

## 2013-12-17 LAB — CBC WITH DIFFERENTIAL/PLATELET
BASO%: 0.7 % (ref 0.0–2.0)
Basophils Absolute: 0.1 10*3/uL (ref 0.0–0.1)
EOS%: 0.5 % (ref 0.0–7.0)
Eosinophils Absolute: 0.1 10*3/uL (ref 0.0–0.5)
HCT: 35 % (ref 34.8–46.6)
HGB: 11 g/dL — ABNORMAL LOW (ref 11.6–15.9)
LYMPH%: 11.9 % — ABNORMAL LOW (ref 14.0–49.7)
MCH: 26.6 pg (ref 25.1–34.0)
MCHC: 31.4 g/dL — ABNORMAL LOW (ref 31.5–36.0)
MCV: 84.9 fL (ref 79.5–101.0)
MONO#: 0.1 10*3/uL (ref 0.1–0.9)
MONO%: 0.3 % (ref 0.0–14.0)
NEUT#: 15.8 10*3/uL — ABNORMAL HIGH (ref 1.5–6.5)
NEUT%: 86.6 % — ABNORMAL HIGH (ref 38.4–76.8)
Platelets: 118 10*3/uL — ABNORMAL LOW (ref 145–400)
RBC: 4.12 10*6/uL (ref 3.70–5.45)
RDW: 18.9 % — ABNORMAL HIGH (ref 11.2–14.5)
WBC: 18.3 10*3/uL — ABNORMAL HIGH (ref 3.9–10.3)
lymph#: 2.2 10*3/uL (ref 0.9–3.3)

## 2013-12-17 MED ORDER — AZITHROMYCIN 250 MG PO TABS: ORAL_TABLET | ORAL | Status: DC

## 2013-12-17 NOTE — Telephone Encounter (Signed)
Spoke with Ms. [name]. She stated that she has a a temp of 100.4 the last 2 evenings.  She has developed a productive cough with yellow phlegm.  She has not had shaking chills. Told her that with her [place] low on 11-2 at 1.4 the counts will be reaching the lowest point in the next few days and they may be very low right now even though she received the neulasta injection on 12-13-13. She needs to have her cbc checked today as well as evaluated cough and temp as noted below by Dr. Marko Plume. Ms. Sherlynn Stalls has been wearing a mask at work that last few days. She will come in at 1400 and get labs and see Awilda Metro PA-C at 1415.  Ms. Sherlynn Stalls verbalized understanding.

## 2013-12-17 NOTE — Progress Notes (Signed)
OFFICE PROGRESS NOTE   12/17/2013   Physicians:Emma Barrington Ellison, MD (PCP), Mardene Speak, M.Wert, P.Nahser, M.Stark/ J.Mann  INTERVAL HISTORY:  Patient is seen, together with significant other, in continuing attention to recently diagnosed high grade serous gyn carcinoma, clinically consistent with IIIC ovarian. She received first neoadjuvant chemotherapy with carboplatin and taxol on 12-07-13, doses reduced due to concern for cirrhosis. She had significant taxol aches beginning day 3. She received Neulasta on 12/13/2013.  She presents today for work in visit complaining of low-grade temperatures with MAXIMUM TEMPERATURE of 100.4. She complains of fatigue. She usually has a dry cough however has had some yellowish secretions over the past couple of days. She reports she had a paracentesis about 3 weeks ago(12/03/2013) with removal of 3.6 L.prior paracentesis was on 11/18/2013 with removal of 4.1 L. She currently does not feel as though she needs another paracentesis.she does report that her appetite has improved.  Per Dr Lynne Leader reevaluation, NASH is most likely cause of her cirrhosis and portal hypertension. Liver biopsy is contraindicated due to ascites and EGD to evaluate for varices deferred due to chemotherapy. If paracentesis is repeated, he requests cell count, gram stain, albumin, amylase and culture. He is concerned that chemotherapy could cause hepatic decompensation, suggests watching LFTs and coags.    She does not have PAC She had flu vaccine   ONCOLOGIC HISTORY Patient initially had problems in ~ July 2015 with stress urinary incontinence related to severe chronic cough. Urine culture by PCP showed no infection and she was referred to Dr Clois Comber, who adjusted medications with great improvement in the cough. Despite improved cough, she continued with some urinary incontinence. She saw Dr Ronita Hipps in 10-2013, with pelvic mass on exam and CA 125 1195 on 11-08-13. She had CT AP  11-12-13, which showed large ascites, irregular and shrunken liver, mild splenomegaly, increased soft tissue in pelvis bilaterally and anterior mid and lower peritoneum, small left pleural effusion. She saw Dr Denman George in consultation on 11-15-13, who agreed that this was likely gyn malignancy. She had US paracentesis of 4.1 liters on 11-18-13, with cytology ([id number]) and immunohistochemical stains consistent with serous carcinoma of Mullerian tract primary, favor ovarian. She had second paracentesis for 3.6 liters on 12-03-13.  Gyn oncology recommended initial treatment with chemotherapy. She had first carboplatin and taxol on 12-07-13, with carboplatin dosed at AUC=4 and taxol at 135 mg/m2 due to concern for cirrhosis.   Review of systems as above, also: Slight throat irritation and some increase in cough, tho this is not nearly as deep and severe as prior to Dr Gustavus Bryant interventions. No sputum, no esophagitis, no sinus symptoms, no fever. No bleeding. Metallic taste. Bladder ok. No LE swelling. No peripheral neuropathy. Peripheral IV access accomplished without difficulty. Remainder of 10 point Review of Systems negative.  Objective:  Vital signs in last 24 hours:  BP 143/61 mmHg  Pulse 68  Temp(Src) 98.3 F (36.8 C)  Resp 16  Wt 177 lb 8 oz (80.513 kg) Weight is up 3 lbs.  Alert, oriented and appropriate. Ambulatory without assistance. In no distress. Mild coughing x1. No alopecia  HEENT:PERRL, sclerae not icteric. Oral mucosa moist without lesions, posterior pharynx clear.  Neck supple. No JVD.  Lymphatics:no cervical,suraclavicular or inguinal adenopathy Resp: clear to auscultation bilaterally and normal percussion bilaterally Cardio: regular rate and rhythm. No gallop. GI: abdomen obese but softer and less distended especially in upper abdomen, not tender, no mass or organomegaly. Normally active bowel sounds.  Musculoskeletal/ Extremities:  without pitting edema, cords,  tenderness Neuro: no peripheral neuropathy. Otherwise nonfocal Skin without rash, ecchymosis, petechiae   Lab Results:  Results for orders placed or performed in visit on 12/17/13  CBC with Differential  Result Value Ref Range   WBC 18.3 (H) 3.9 - 10.3 10e3/uL   NEUT# 15.8 (H) 1.5 - 6.5 10e3/uL   HGB 11.0 (L) 11.6 - 15.9 g/dL   HCT 35.0 34.8 - 46.6 %   Platelets 118 (L) 145 - 400 10e3/uL   MCV 84.9 79.5 - 101.0 fL   MCH 26.6 25.1 - 34.0 pg   MCHC 31.4 (L) 31.5 - 36.0 g/dL   RBC 4.12 3.70 - 5.45 10e6/uL   RDW 18.9 (H) 11.2 - 14.5 %   lymph# 2.2 0.9 - 3.3 10e3/uL   MONO# 0.1 0.1 - 0.9 10e3/uL   Eosinophils Absolute 0.1 0.0 - 0.5 10e3/uL   Basophils Absolute 0.1 0.0 - 0.1 10e3/uL   NEUT% 86.6 (H) 38.4 - 76.8 %   LYMPH% 11.9 (L) 14.0 - 49.7 %   MONO% 0.3 0.0 - 14.0 %   EOS% 0.5 0.0 - 7.0 %   BASO% 0.7 0.0 - 2.0 %    AST and ALT had been 27 and 25 on day of chemo 12-07-13  Studies/Results:  No results found.  Medications: I have reviewed the patient's current medications.   DISCUSSION: her counts have recovered and are showing the effects of the Neulasta although cannot rule out any increased related to her current symptom, this. I suspect that she has a bronchitis-like picture. To that and I will place her on a Z-Pak. She is cautioned that should her symptoms persist or worsen to contact our on-call physician for further guidance. Both she and her significant other voiced understanding.  Assessment/Plan: 1.high grade serous gyn carcinoma, clinically IIIC and possibly ovarian primary: only mildly to moderately symptomatic, from malignant ascites which is reaccumulating since paracentesis on 11-18-13. Neoadjuvant chemotherapy with carboplatin and taxol begun 12-07-13, drugs dose reduced due to concern re cirrhosis. Neulasta given today, still may become neutropenic in next week. I will see her with counts, chemistries and INR on 12-20-13. 2.cirrhosis of liver by scans, with mild  splenomegaly/ suggestion of portal HTN: likely NASH. Appreciate very helpful consultation by Dr Fuller Plan as above. 3.past tobacco 4.diabetes on oral agent. Reminded her to check blood sugars if she is not eating, told her ok to hold metformin if unable to eat. 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 Will try to assist her in getting off of supplemental estrogen after rest of situation is more stable 13.bronchitis-patient will be placed on a Z-Pak as discussed above. She will follow-up as previously scheduled with Dr. Marko Plume.   Patient and friend know to call if questions or concerns prior to visit next week Time spent 30 min including >50% counseling and coordination of care.    Awilda Metro E, PA-C   12/17/2013, 4:55 PM

## 2013-12-17 NOTE — Telephone Encounter (Signed)
-----   Message from Gordy Levan, MD sent at 12/14/2013  7:22 PM EST ----- Counts may drop significantly in the next week - I am to see her next on Mon 11-9. She had neulasta 11-2 Please check on her by phone 11-5 or 11-6. If concerns, please repeat CBC on 11-5 or 11-6. She may need to stay out of work; she may need prophylactic cipro 250 bid if neutropenic. RN please go over neutropenic precautions. 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

## 2013-12-18 ENCOUNTER — Other Ambulatory Visit: Payer: Self-pay | Admitting: Oncology

## 2013-12-20 ENCOUNTER — Other Ambulatory Visit (HOSPITAL_BASED_OUTPATIENT_CLINIC_OR_DEPARTMENT_OTHER): Payer: Medicare Other

## 2013-12-20 ENCOUNTER — Encounter: Payer: Self-pay | Admitting: Oncology

## 2013-12-20 ENCOUNTER — Ambulatory Visit (HOSPITAL_COMMUNITY)
Admission: RE | Admit: 2013-12-20 | Discharge: 2013-12-20 | Disposition: A | Discharge status: Home / Self Care | Payer: Medicare Other | Source: Ambulatory Visit | Attending: Oncology | Admitting: Oncology

## 2013-12-20 ENCOUNTER — Ambulatory Visit (HOSPITAL_BASED_OUTPATIENT_CLINIC_OR_DEPARTMENT_OTHER): Payer: Medicare Other | Admitting: Oncology

## 2013-12-20 VITALS — BP 133/58 | HR 57 | Temp 97.8°F | Resp 18 | Ht 61.5 in | Wt 180.8 lb

## 2013-12-20 DIAGNOSIS — C569 Malignant neoplasm of unspecified ovary: Secondary | ICD-10-CM | POA: Diagnosis not present

## 2013-12-20 DIAGNOSIS — K7581 Nonalcoholic steatohepatitis (NASH): Secondary | ICD-10-CM | POA: Insufficient documentation

## 2013-12-20 DIAGNOSIS — K7460 Unspecified cirrhosis of liver: Secondary | ICD-10-CM

## 2013-12-20 DIAGNOSIS — R180 Malignant ascites: Secondary | ICD-10-CM | POA: Insufficient documentation

## 2013-12-20 DIAGNOSIS — N393 Stress incontinence (female) (male): Secondary | ICD-10-CM

## 2013-12-20 DIAGNOSIS — R161 Splenomegaly, not elsewhere classified: Secondary | ICD-10-CM

## 2013-12-20 DIAGNOSIS — E119 Type 2 diabetes mellitus without complications: Secondary | ICD-10-CM

## 2013-12-20 DIAGNOSIS — C801 Malignant (primary) neoplasm, unspecified: Secondary | ICD-10-CM

## 2013-12-20 DIAGNOSIS — K766 Portal hypertension: Secondary | ICD-10-CM

## 2013-12-20 DIAGNOSIS — K760 Fatty (change of) liver, not elsewhere classified: Secondary | ICD-10-CM

## 2013-12-20 DIAGNOSIS — D696 Thrombocytopenia, unspecified: Secondary | ICD-10-CM

## 2013-12-20 DIAGNOSIS — I10 Essential (primary) hypertension: Secondary | ICD-10-CM

## 2013-12-20 LAB — CBC WITH DIFFERENTIAL/PLATELET
BASO%: 0.7 % (ref 0.0–2.0)
Basophils Absolute: 0.1 10*3/uL (ref 0.0–0.1)
EOS%: 0.2 % (ref 0.0–7.0)
Eosinophils Absolute: 0 10*3/uL (ref 0.0–0.5)
HCT: 35 % (ref 34.8–46.6)
HGB: 11 g/dL — ABNORMAL LOW (ref 11.6–15.9)
LYMPH%: 12.8 % — ABNORMAL LOW (ref 14.0–49.7)
MCH: 26.6 pg (ref 25.1–34.0)
MCHC: 31.5 g/dL (ref 31.5–36.0)
MCV: 84.5 fL (ref 79.5–101.0)
MONO#: 0 10*3/uL — ABNORMAL LOW (ref 0.1–0.9)
MONO%: 0.3 % (ref 0.0–14.0)
NEUT#: 12.9 10*3/uL — ABNORMAL HIGH (ref 1.5–6.5)
NEUT%: 86 % — ABNORMAL HIGH (ref 38.4–76.8)
Platelets: 74 10*3/uL — ABNORMAL LOW (ref 145–400)
RBC: 4.14 10*6/uL (ref 3.70–5.45)
RDW: 19.1 % — ABNORMAL HIGH (ref 11.2–14.5)
WBC: 15 10*3/uL — ABNORMAL HIGH (ref 3.9–10.3)
lymph#: 1.9 10*3/uL (ref 0.9–3.3)

## 2013-12-20 LAB — COMPREHENSIVE METABOLIC PANEL (CC13)
ALT: 42 U/L (ref 0–55)
AST: 45 U/L — ABNORMAL HIGH (ref 5–34)
Albumin: 3.1 g/dL — ABNORMAL LOW (ref 3.5–5.0)
Alkaline Phosphatase: 192 U/L — ABNORMAL HIGH (ref 40–150)
Anion Gap: 8 mEq/L (ref 3–11)
BUN: 8.5 mg/dL (ref 7.0–26.0)
CO2: 24 mEq/L (ref 22–29)
Calcium: 8.2 mg/dL — ABNORMAL LOW (ref 8.4–10.4)
Chloride: 108 mEq/L (ref 98–109)
Creatinine: 0.8 mg/dL (ref 0.6–1.1)
Glucose: 93 mg/dl (ref 70–140)
Potassium: 3.6 mEq/L (ref 3.5–5.1)
Sodium: 140 mEq/L (ref 136–145)
Total Bilirubin: 0.4 mg/dL (ref 0.20–1.20)
Total Protein: 7.4 g/dL (ref 6.4–8.3)

## 2013-12-20 LAB — BODY FLUID CELL COUNT WITH DIFFERENTIAL
Lymphs, Fluid: 15 %
Monocyte-Macrophage-Serous Fluid: 54 % (ref 50–90)
Neutrophil Count, Fluid: 21 % (ref 0–25)
Total Nucleated Cell Count, Fluid: 1060 cu mm — ABNORMAL HIGH (ref 0–1000)

## 2013-12-20 LAB — ALBUMIN, FLUID (OTHER): Albumin, Fluid: 1.7 g/dL

## 2013-12-20 LAB — PROTIME-INR
INR: 1.2 — ABNORMAL LOW (ref 2.00–3.50)
Protime: 14.4 Seconds — ABNORMAL HIGH (ref 10.6–13.4)

## 2013-12-20 NOTE — Progress Notes (Signed)
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 She had cycle 1 taxol carboplatin on 12-07-13 with neulasta on 12-13-13.She was seen by APP on 12-17-13 for productive cough with temp 100.4, not neutropenic and respiratory symptoms improving on Zpack.  Patient feels that ascites is increasing, with abdominal fullness and recurrent bladder symptoms. Bowels are moving and she is eating and drinking fluids.  She does not have abdominal pain. Fever has resolved. She is not SOB.  She does not have PAC She had flu vaccine.   ONCOLOGIC HISTORY Patient initially had problems in ~ July 2015 with stress urinary incontinence related to severe chronic cough. Urine culture by PCP showed no infection and she was referred to Dr Clois Comber, who adjusted medications with great improvement in the cough. Despite improved cough, she continued with some urinary incontinence. She saw Dr Ronita Hipps in 10-2013, with pelvic mass on exam and CA 125 1195 on 11-08-13. She had CT AP 11-12-13, which showed large ascites, irregular and shrunken liver, mild splenomegaly, increased soft tissue in pelvis bilaterally and anterior mid and lower peritoneum, small left pleural effusion. She saw Dr Denman George in consultation on 11-15-13, who agreed that this was likely gyn malignancy. She had US paracentesis of 4.1 liters on 11-18-13, with cytology ([id number]) and immunohistochemical stains consistent with serous carcinoma of Mullerian tract primary, favor ovarian. She had second paracentesis for 3.6 liters on 12-03-13. Gyn oncology recommended initial treatment with chemotherapy. She had first carboplatin and taxol on 12-07-13, with carboplatin dosed at AUC=4 and taxol at 135 mg/m2 due to concern  for cirrhosis   Review of systems as above, also: No wheezing, no chest pain. No vomiting. No bleeding. No LE swelling. No bleeding. Blood sugar in 60s once in past week. Remainder of 10 point Review of Systems negative.  Objective:  Vital signs in last 24 hours:  BP 133/58 mmHg  Pulse 57  Temp(Src) 97.8 F (36.6 C) (Oral)  Resp 18  Ht 5' 1.5" (1.562 m)  Wt 180 lb 12.8 oz (82.01 kg)  BMI 33.61 kg/m2 weight is up 3 lbs.  Alert, oriented and appropriate. Ambulatory without difficulty.  No alopecia  HEENT:PERRL, sclerae not icteric. Oral mucosa moist without lesions, posterior pharynx clear.  Neck supple. No JVD.  Lymphatics:no cervical,suraclavicular or inguinal adenopathy Resp: clear to auscultation bilaterally and normal percussion bilaterally. Respirations not labored RA. No coughing during exam. Cardio: regular rate and rhythm. No gallop. GI: soft, nontender, more distended, nearly tight especially upper quadrants. Normally active bowel sounds.  Musculoskeletal/ Extremities: without pitting edema, cords, tenderness Neuro: no peripheral neuropathy. Otherwise nonfocal. PSYCH appropriate mood and affect Skin without rash, ecchymosis, petechiae  Lab Results:  Results for orders placed or performed in visit on 12/20/13  CBC with Differential  Result Value Ref Range   WBC 15.0 (H) 3.9 - 10.3 10e3/uL   NEUT# 12.9 (H) 1.5 - 6.5 10e3/uL   HGB 11.0 (L) 11.6 - 15.9 g/dL   HCT 35.0 34.8 - 46.6 %   Platelets 74 (L) 145 - 400 10e3/uL   MCV 84.5 79.5 - 101.0 fL   MCH 26.6 25.1 - 34.0 pg   MCHC 31.5 31.5 - 36.0 g/dL   RBC 4.14 3.70 - 5.45 10e6/uL   RDW 19.1 (H) 11.2 - 14.5 %  lymph# 1.9 0.9 - 3.3 10e3/uL   MONO# 0.0 (L) 0.1 - 0.9 10e3/uL   Eosinophils Absolute 0.0 0.0 - 0.5 10e3/uL   Basophils Absolute 0.1 0.0 - 0.1 10e3/uL   NEUT% 86.0 (H) 38.4 - 76.8 %   LYMPH% 12.8 (L) 14.0 - 49.7 %   MONO% 0.3 0.0 - 14.0 %   EOS% 0.2 0.0 - 7.0 %   BASO% 0.7 0.0 - 2.0 %  Protime-INR   Result Value Ref Range   Protime 14.4 (H) 10.6 - 13.4 Seconds   INR 1.20 (L) 2.00 - 3.50   Lovenox No    Platelet count noted.  CMET available after visit has normal electrolytes, AP 192 (previously normal), AST 45 (last week 73), ALT 42 (last week 74), albumin 3.1  Studies/Results:  No results found.  Medications: I have reviewed the patient's current medications. Complete Z pack. Continue inhalers as prescribed previously by Dr [name]. She may need to hold metformin if blood sugars low/ if not eating.  DISCUSSION: she would like paracentesis, which IR is able to do today per my discussion directly, including notification of platelets 74K - will send fluid for studies as requested by Dr Fuller Plan, orders placed for IR.   Assessment/Plan:  1.high grade serous gyn carcinoma, clinically IIIC ovarian primary:  Neoadjuvant chemotherapy with carboplatin and taxol begun 12-07-13, drugs dose reduced due to concern re cirrhosis, Neulasta 12-13-13. I will see her with labs 11-16, prior to cycle 2 on 12-28-13. US paracentesis by IR today. 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 Ascites to be sent for cell count, gram stain, albumin, amylase and culture. Following LFTs and INR as recommended by Dr Fuller Plan, as there is concern that chemo may decompensate the cirrhosis. 3.bronchitis symptoms improved on  Zpack since 12-17-13. Fever resolved. 4.diabetes on oral agent. Needs to check blood sugars if she is not eating, hold metformin if unable to eat. 5.stress urinary incontinence likely related to pelvic mass, worse with increasing ascites now 6.chronic cough: better with medication adjustments by Dr [name]. Past tobacco 7. 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 Patient understands that she should call if any significant bleeding. 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 Will try to assist her in getting off of supplemental estrogen after rest of situation is more stable 11.narrow angle glaucoma  Time spent 25 min including >50% counseling and coordination of care.   Tari Lecount P, MD   12/20/2013, 1:42 PM

## 2013-12-20 NOTE — Procedures (Signed)
US guided diagnostic/therapeutic paracentesis performed yielding 2.2 liters slightly turbid, yellow fluid. A portion of the fluid was sent to the lab for preordered studies. No immediate complications.

## 2013-12-21 ENCOUNTER — Telehealth: Payer: Self-pay | Admitting: Oncology

## 2013-12-21 ENCOUNTER — Telehealth: Payer: Self-pay | Admitting: *Deleted

## 2013-12-21 LAB — AMYLASE, PERITONEAL FLUID: Amylase, peritoneal fluid: 32 U/L

## 2013-12-21 NOTE — Telephone Encounter (Signed)
Per staff message and POF I have scheduled appts. Advised scheduler of appts. JMW  

## 2013-12-22 NOTE — Patient Instructions (Signed)
Take the Z-Pak as prescribed Follow-up as previously scheduled

## 2013-12-24 ENCOUNTER — Telehealth: Payer: Self-pay | Admitting: Oncology

## 2013-12-24 LAB — BODY FLUID CULTURE
Culture: NO GROWTH
Special Requests: NORMAL

## 2013-12-24 NOTE — Telephone Encounter (Signed)
schedule change - moved 12/2 appts to 12/3. other appts remain the same. lmonvm for pt and pt to get new schedule at next appt 11/16.

## 2013-12-26 ENCOUNTER — Other Ambulatory Visit: Payer: Self-pay | Admitting: Oncology

## 2013-12-27 ENCOUNTER — Encounter: Payer: Self-pay | Admitting: Oncology

## 2013-12-27 ENCOUNTER — Other Ambulatory Visit (HOSPITAL_BASED_OUTPATIENT_CLINIC_OR_DEPARTMENT_OTHER): Payer: Medicare Other

## 2013-12-27 ENCOUNTER — Ambulatory Visit (HOSPITAL_BASED_OUTPATIENT_CLINIC_OR_DEPARTMENT_OTHER): Payer: Medicare Other | Admitting: Oncology

## 2013-12-27 VITALS — BP 139/75 | HR 59 | Temp 98.2°F | Resp 18 | Ht 61.5 in | Wt 176.2 lb

## 2013-12-27 DIAGNOSIS — K7581 Nonalcoholic steatohepatitis (NASH): Secondary | ICD-10-CM

## 2013-12-27 DIAGNOSIS — K7460 Unspecified cirrhosis of liver: Secondary | ICD-10-CM

## 2013-12-27 DIAGNOSIS — C569 Malignant neoplasm of unspecified ovary: Secondary | ICD-10-CM

## 2013-12-27 DIAGNOSIS — R180 Malignant ascites: Secondary | ICD-10-CM

## 2013-12-27 DIAGNOSIS — K766 Portal hypertension: Secondary | ICD-10-CM

## 2013-12-27 LAB — CBC WITH DIFFERENTIAL/PLATELET
BASO%: 0.3 % (ref 0.0–2.0)
Basophils Absolute: 0 10*3/uL (ref 0.0–0.1)
EOS%: 0.4 % (ref 0.0–7.0)
Eosinophils Absolute: 0 10*3/uL (ref 0.0–0.5)
HCT: 36.4 % (ref 34.8–46.6)
HGB: 11.7 g/dL (ref 11.6–15.9)
LYMPH%: 14.2 % (ref 14.0–49.7)
MCH: 27.5 pg (ref 25.1–34.0)
MCHC: 32.1 g/dL (ref 31.5–36.0)
MCV: 85.4 fL (ref 79.5–101.0)
MONO#: 0.3 10*3/uL (ref 0.1–0.9)
MONO%: 4.7 % (ref 0.0–14.0)
NEUT#: 5.8 10*3/uL (ref 1.5–6.5)
NEUT%: 80.4 % — ABNORMAL HIGH (ref 38.4–76.8)
Platelets: 157 10*3/uL (ref 145–400)
RBC: 4.26 10*6/uL (ref 3.70–5.45)
RDW: 18.6 % — ABNORMAL HIGH (ref 11.2–14.5)
WBC: 7.2 10*3/uL (ref 3.9–10.3)
lymph#: 1 10*3/uL (ref 0.9–3.3)

## 2013-12-27 LAB — COMPREHENSIVE METABOLIC PANEL (CC13)
ALT: 33 U/L (ref 0–55)
AST: 33 U/L (ref 5–34)
Albumin: 3.4 g/dL — ABNORMAL LOW (ref 3.5–5.0)
Alkaline Phosphatase: 147 U/L (ref 40–150)
Anion Gap: 7 mEq/L (ref 3–11)
BUN: 9.8 mg/dL (ref 7.0–26.0)
CO2: 24 mEq/L (ref 22–29)
Calcium: 9 mg/dL (ref 8.4–10.4)
Chloride: 108 mEq/L (ref 98–109)
Creatinine: 0.8 mg/dL (ref 0.6–1.1)
Glucose: 92 mg/dl (ref 70–140)
Potassium: 4 mEq/L (ref 3.5–5.1)
Sodium: 139 mEq/L (ref 136–145)
Total Bilirubin: 0.47 mg/dL (ref 0.20–1.20)
Total Protein: 8.1 g/dL (ref 6.4–8.3)

## 2013-12-27 LAB — PROTIME-INR
INR: 1.2 — ABNORMAL LOW (ref 2.00–3.50)
Protime: 14.4 Seconds — ABNORMAL HIGH (ref 10.6–13.4)

## 2013-12-27 NOTE — Progress Notes (Signed)
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 Cycle 1 chemo had platelet nadir of 74k, neulasta given day 7. She is due cycle 2 carboplatin taxol on 12-28-13. She had repeat paracentesis by IR on 12-20-13 for 2.2 liters ascites, additional studies sent as requested by Dr Fuller Plan; she has been more comfortable since this paracentesis.  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   Patient noticed easy bruising without bleeding around time that platelets were low. She has some discoloration thought related to tape, and minimal nodularity along vein right forearm at site of chemo access cycle 1, tho that IV was easily established and not uncomfortable during treatment. Respiratory symptoms resolved with Z pack, no productive cough or significant sinus drainage now. Not much nausea. Bowels are moving. Not sleeping well as she is up multiple times thru night to void, is pushing fluids up until bedtime so will stop this a few hours prior; no dysuria. Leg cramps again last pm.  Blood sugar 123 this AM.   She does not have PAC She had flu vaccine.  ONCOLOGIC HISTORY Patient initially had problems in ~ July 2015 with stress urinary incontinence related to severe chronic cough. Urine culture by PCP showed no infection and she was referred to Dr Clois Comber, who adjusted medications with great improvement in the cough. Despite improved cough, she continued with some urinary incontinence. She saw Dr Ronita Hipps in 10-2013, with pelvic mass on exam and CA 125 1195 on 11-08-13. She had CT AP 11-12-13, which showed large ascites, irregular and shrunken liver, mild splenomegaly, increased soft tissue in pelvis bilaterally and anterior mid and lower  peritoneum, small left pleural effusion. She saw Dr Denman George in consultation on 11-15-13, who agreed that this was likely gyn malignancy. She had US paracentesis of 4.1 liters on 11-18-13, with cytology ([id number]) and immunohistochemical stains consistent with serous carcinoma of Mullerian tract primary, favor ovarian. She had second paracentesis for 3.6 liters on 12-03-13. Gyn oncology recommended initial treatment with chemotherapy. She had first carboplatin and taxol on 12-07-13, with carboplatin dosed at AUC=4 and taxol at 135 mg/m2 due to concern for cirrhosis  Review of systems as above, also: No fever. No LE swelling. Hair has started to come out, scalp not tender. Remainder of 10 point Review of Systems negative.  Objective:  Vital signs in last 24 hours: Weight down 6 lbs to 176 post paracentesis. 139/75, 59 regular, 18 not labored RA, 97.6  Alert, oriented and appropriate. Ambulatory without difficulty.  Partial alopecia  HEENT:PERRL, sclerae not icteric. Oral mucosa moist without lesions, posterior pharynx clear.  Neck supple. No JVD.  Lymphatics:no cervical,suraclavicular,  or inguinal adenopathy Resp: clear to auscultation bilaterally and normal percussion bilaterally Cardio: regular rate and rhythm. No gallop. GI: soft, nontender, full but not as tightly distended in upper abdomen. Normally active bowel sounds.  Musculoskeletal/ Extremities: without pitting edema, cords, tenderness Neuro: no peripheral neuropathy. Otherwise nonfocal. PSYCH appropriate mood and affect Skin without rash, ecchymosis, petechiae   Lab Results:  Results for orders placed or performed during the hospital encounter of 12/20/13  Body fluid culture  Result Value Ref Range   Specimen Description PERITONEAL CAVITY    Special Requests Normal    Gram Stain      FEW WBC  PRESENT, PREDOMINANTLY MONONUCLEAR NO ORGANISMS SEEN Performed at [facility]    Culture      NO GROWTH 3 DAYS Performed  at [facility]    Report Status 12/24/2013 FINAL   Body fluid cell count with differential  Result Value Ref Range   Fluid Type-FCT PERITONEAL CAVITY    Color, Fluid YELLOW YELLOW   Appearance, Fluid CLOUDY (A) CLEAR   WBC, Fluid 1060 (H) 0 - 1000 cu mm   Neutrophil Count, Fluid 21 0 - 25 %   Lymphs, Fluid 15 %   Monocyte-Macrophage-Serous Fluid 54 50 - 90 %   Other Cells, Fluid OTHER CELLS IDENTIFIED %  Albumin fluid, pleural or peritoneal  Result Value Ref Range   Albumin, Fluid 1.7 g/dL   Fluid Type-FALB PERITONEAL CAVITY   Amylase, Peritoneal Fluid  Result Value Ref Range   Amylase, peritoneal fluid 32 U/L     Studies/Results:  No results found.  Medications: I have reviewed the patient's current medications.  DISCUSSION: counts adequate for cycle 2 carbo taxol on 12-27-13.  Decrease fluids x several hours prior to bedtime. Add gatorade/ sports drinks due to leg cramps.   Assessment/Plan:  1.high grade serous gyn carcinoma, clinically IIIC ovarian primary: Neoadjuvant chemotherapy with carboplatin and taxol begun 12-07-13, drugs dose reduced due to concern re cirrhosis, Neulasta 12-13-13. Cycle 2 to be given 12-28-13 without dose increase, then MD with lab 11-23 + gCSF that day. 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 Ascites sent for cell count, gram stain, albumin, amylase and culture on 12-20-13, that information forwarded to Dr Fuller Plan. Following LFTs and INR as recommended, as there is concern that chemo may decompensate the cirrhosis, but fortunately tolerated cycle 1.. 3.bronchitis symptoms and fever resolved with azithromycin 4.diabetes on oral agent. Again encouraged to check blood sugars. 5.stress urinary incontinence likely related to pelvic mass, worse with increasing ascites now 6.chronic cough: better with medication adjustments by Dr [name]. Past tobacco 7. 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 Will try to assist her in getting off of supplemental estrogen after rest of situation is more stable 11.narrow angle glaucoma   All questions answered. Patient is comfortable with discussion and plans. Chemo orders confirmed.     LIVESAY,LENNIS P, MD   12/27/2013, 12:02 PM

## 2013-12-28 ENCOUNTER — Telehealth: Payer: Self-pay | Admitting: *Deleted

## 2013-12-28 ENCOUNTER — Ambulatory Visit (HOSPITAL_BASED_OUTPATIENT_CLINIC_OR_DEPARTMENT_OTHER): Payer: Medicare Other

## 2013-12-28 DIAGNOSIS — Z5111 Encounter for antineoplastic chemotherapy: Secondary | ICD-10-CM

## 2013-12-28 DIAGNOSIS — C569 Malignant neoplasm of unspecified ovary: Secondary | ICD-10-CM

## 2013-12-28 LAB — CA 125: CA 125: 762 U/mL — ABNORMAL HIGH (ref <35)

## 2013-12-28 LAB — CA 125(PREVIOUS METHOD): CA 125: 402.8 U/mL — ABNORMAL HIGH (ref 0.0–30.2)

## 2013-12-28 MED ORDER — CARBOPLATIN CHEMO INJECTION 450 MG/45ML
389.6000 mg | Freq: Once | INTRAVENOUS | Status: AC
  Administered 2013-12-28: 390 mg via INTRAVENOUS
  Filled 2013-12-28: qty 39

## 2013-12-28 MED ORDER — ONDANSETRON 16 MG/50ML IVPB (CHCC)
INTRAVENOUS | Status: AC
  Filled 2013-12-28: qty 16

## 2013-12-28 MED ORDER — FAMOTIDINE IN NACL 20-0.9 MG/50ML-% IV SOLN
INTRAVENOUS | Status: AC
  Filled 2013-12-28: qty 50

## 2013-12-28 MED ORDER — DEXAMETHASONE SODIUM PHOSPHATE 20 MG/5ML IJ SOLN
20.0000 mg | Freq: Once | INTRAMUSCULAR | Status: AC
  Administered 2013-12-28: 20 mg via INTRAVENOUS

## 2013-12-28 MED ORDER — ONDANSETRON 16 MG/50ML IVPB (CHCC)
16.0000 mg | Freq: Once | INTRAVENOUS | Status: AC
  Administered 2013-12-28: 16 mg via INTRAVENOUS

## 2013-12-28 MED ORDER — SODIUM CHLORIDE 0.9 % IV SOLN
Freq: Once | INTRAVENOUS | Status: AC
  Administered 2013-12-28: 09:00:00 via INTRAVENOUS

## 2013-12-28 MED ORDER — DIPHENHYDRAMINE HCL 50 MG/ML IJ SOLN
INTRAMUSCULAR | Status: AC
  Filled 2013-12-28: qty 1

## 2013-12-28 MED ORDER — DIPHENHYDRAMINE HCL 50 MG/ML IJ SOLN
50.0000 mg | Freq: Once | INTRAMUSCULAR | Status: AC
  Administered 2013-12-28: 50 mg via INTRAVENOUS

## 2013-12-28 MED ORDER — FAMOTIDINE IN NACL 20-0.9 MG/50ML-% IV SOLN
20.0000 mg | Freq: Once | INTRAVENOUS | Status: AC
  Administered 2013-12-28: 20 mg via INTRAVENOUS

## 2013-12-28 MED ORDER — DEXAMETHASONE SODIUM PHOSPHATE 20 MG/5ML IJ SOLN
INTRAMUSCULAR | Status: AC
  Filled 2013-12-28: qty 5

## 2013-12-28 MED ORDER — PACLITAXEL CHEMO INJECTION 300 MG/50ML
135.0000 mg/m2 | Freq: Once | INTRAVENOUS | Status: AC
  Administered 2013-12-28: 258 mg via INTRAVENOUS
  Filled 2013-12-28: qty 43

## 2013-12-28 NOTE — Telephone Encounter (Signed)
-----   Message from Gordy Levan, MD sent at 12/28/2013  1:00 PM EST ----- Labs seen and need follow up: please let her know ca125 marker is down to 762 from 1938 on 12-01-13 (both by new lab method), showing some improvement already with the chemo (she has #2 chemo 11-17)

## 2013-12-28 NOTE — Telephone Encounter (Signed)
Called pt as noted below by Dr.Livesay. Pt happened to be in infusion room for treatment. Walked over and gave pt a copy of CA125 lab results.

## 2013-12-28 NOTE — Patient Instructions (Signed)
[facility] Discharge Instructions for Patients Receiving Chemotherapy  Today you received the following chemotherapy agents:  Taxol and Carboplatin  To help prevent nausea and vomiting after your treatment, we encourage you to take your nausea medication as ordered per MD.   If you develop nausea and vomiting that is not controlled by your nausea medication, call the clinic.   BELOW ARE SYMPTOMS THAT SHOULD BE REPORTED IMMEDIATELY:  *FEVER GREATER THAN 100.5 F  *CHILLS WITH OR WITHOUT FEVER  NAUSEA AND VOMITING THAT IS NOT CONTROLLED WITH YOUR NAUSEA MEDICATION  *UNUSUAL SHORTNESS OF BREATH  *UNUSUAL BRUISING OR BLEEDING  TENDERNESS IN MOUTH AND THROAT WITH OR WITHOUT PRESENCE OF ULCERS  *URINARY PROBLEMS  *BOWEL PROBLEMS  UNUSUAL RASH Items with * indicate a potential emergency and should be followed up as soon as possible.  Feel free to call the clinic you have any questions or concerns. The clinic phone number is [phone].

## 2013-12-31 ENCOUNTER — Other Ambulatory Visit: Payer: Self-pay

## 2013-12-31 DIAGNOSIS — C569 Malignant neoplasm of unspecified ovary: Secondary | ICD-10-CM

## 2014-01-02 ENCOUNTER — Other Ambulatory Visit: Payer: Self-pay | Admitting: Oncology

## 2014-01-02 DIAGNOSIS — K7581 Nonalcoholic steatohepatitis (NASH): Secondary | ICD-10-CM | POA: Insufficient documentation

## 2014-01-02 DIAGNOSIS — K7460 Unspecified cirrhosis of liver: Secondary | ICD-10-CM | POA: Insufficient documentation

## 2014-01-03 ENCOUNTER — Telehealth: Payer: Self-pay | Admitting: Oncology

## 2014-01-03 ENCOUNTER — Ambulatory Visit (HOSPITAL_BASED_OUTPATIENT_CLINIC_OR_DEPARTMENT_OTHER): Payer: Medicare Other | Admitting: Oncology

## 2014-01-03 ENCOUNTER — Telehealth: Payer: Self-pay | Admitting: *Deleted

## 2014-01-03 ENCOUNTER — Encounter: Payer: Self-pay | Admitting: Oncology

## 2014-01-03 ENCOUNTER — Other Ambulatory Visit (HOSPITAL_BASED_OUTPATIENT_CLINIC_OR_DEPARTMENT_OTHER): Payer: Medicare Other

## 2014-01-03 ENCOUNTER — Ambulatory Visit: Payer: Medicare Other

## 2014-01-03 DIAGNOSIS — R180 Malignant ascites: Secondary | ICD-10-CM

## 2014-01-03 DIAGNOSIS — C569 Malignant neoplasm of unspecified ovary: Secondary | ICD-10-CM

## 2014-01-03 DIAGNOSIS — R05 Cough: Secondary | ICD-10-CM

## 2014-01-03 DIAGNOSIS — K7581 Nonalcoholic steatohepatitis (NASH): Secondary | ICD-10-CM

## 2014-01-03 DIAGNOSIS — D6959 Other secondary thrombocytopenia: Secondary | ICD-10-CM

## 2014-01-03 DIAGNOSIS — N393 Stress incontinence (female) (male): Secondary | ICD-10-CM

## 2014-01-03 DIAGNOSIS — K766 Portal hypertension: Secondary | ICD-10-CM

## 2014-01-03 DIAGNOSIS — K7460 Unspecified cirrhosis of liver: Secondary | ICD-10-CM

## 2014-01-03 DIAGNOSIS — Z5189 Encounter for other specified aftercare: Secondary | ICD-10-CM

## 2014-01-03 DIAGNOSIS — R161 Splenomegaly, not elsewhere classified: Secondary | ICD-10-CM

## 2014-01-03 LAB — COMPREHENSIVE METABOLIC PANEL (CC13)
ALT: 76 U/L — ABNORMAL HIGH (ref 0–55)
AST: 67 U/L — ABNORMAL HIGH (ref 5–34)
Albumin: 3.6 g/dL (ref 3.5–5.0)
Alkaline Phosphatase: 153 U/L — ABNORMAL HIGH (ref 40–150)
Anion Gap: 7 mEq/L (ref 3–11)
BUN: 12.2 mg/dL (ref 7.0–26.0)
CO2: 25 mEq/L (ref 22–29)
Calcium: 8.9 mg/dL (ref 8.4–10.4)
Chloride: 108 mEq/L (ref 98–109)
Creatinine: 0.8 mg/dL (ref 0.6–1.1)
Glucose: 122 mg/dl (ref 70–140)
Potassium: 4.4 mEq/L (ref 3.5–5.1)
Sodium: 140 mEq/L (ref 136–145)
Total Bilirubin: 0.56 mg/dL (ref 0.20–1.20)
Total Protein: 8.2 g/dL (ref 6.4–8.3)

## 2014-01-03 LAB — CBC WITH DIFFERENTIAL/PLATELET
BASO%: 1.1 % (ref 0.0–2.0)
Basophils Absolute: 0 10*3/uL (ref 0.0–0.1)
EOS%: 2.2 % (ref 0.0–7.0)
Eosinophils Absolute: 0 10*3/uL (ref 0.0–0.5)
HCT: 35 % (ref 34.8–46.6)
HGB: 11.4 g/dL — ABNORMAL LOW (ref 11.6–15.9)
LYMPH%: 38.3 % (ref 14.0–49.7)
MCH: 28.2 pg (ref 25.1–34.0)
MCHC: 32.6 g/dL (ref 31.5–36.0)
MCV: 86.6 fL (ref 79.5–101.0)
MONO#: 0 10*3/uL — ABNORMAL LOW (ref 0.1–0.9)
MONO%: 1.6 % (ref 0.0–14.0)
NEUT#: 1 10*3/uL — ABNORMAL LOW (ref 1.5–6.5)
NEUT%: 56.8 % (ref 38.4–76.8)
Platelets: 142 10*3/uL — ABNORMAL LOW (ref 145–400)
RBC: 4.04 10*6/uL (ref 3.70–5.45)
RDW: 18.4 % — ABNORMAL HIGH (ref 11.2–14.5)
WBC: 1.8 10*3/uL — ABNORMAL LOW (ref 3.9–10.3)
lymph#: 0.7 10*3/uL — ABNORMAL LOW (ref 0.9–3.3)

## 2014-01-03 LAB — PROTIME-INR
INR: 1.2 — ABNORMAL LOW (ref 2.00–3.50)
Protime: 14.4 Seconds — ABNORMAL HIGH (ref 10.6–13.4)

## 2014-01-03 MED ORDER — PEGFILGRASTIM INJECTION 6 MG/0.6ML ~~LOC~~
6.0000 mg | PREFILLED_SYRINGE | Freq: Once | SUBCUTANEOUS | Status: AC
  Administered 2014-01-03: 6 mg via SUBCUTANEOUS
  Filled 2014-01-03: qty 0.6

## 2014-01-03 NOTE — Telephone Encounter (Signed)
, °

## 2014-01-03 NOTE — Patient Instructions (Signed)
Use Senokot S 1-2 tablets once or twice daily to keep bowels moving daily. This is mild laxative + stool softener  Try ativan (lorazepam) 1 mg at bedtime tonight to see if this lets you sleep any better. It will last ~ 4 hrs/ at most 6 hrs if so.  Drink medium size bottle of gatorade or other sports drink daily, for the leg cramps  No chlortrimeton for now, as it is close to Benadryl and you seem to have worse restless legs with benadryl   The prochlorperazine (compazine) for nausea sometimes worsens restless legs, so if you do need it for nausea be sure to tell Dr Marko Plume if the restlessness is worse after it.

## 2014-01-03 NOTE — Telephone Encounter (Signed)
Per staff message and POF I have scheduled/adjusted appts. Advised scheduler of appts. JMW

## 2014-01-03 NOTE — Progress Notes (Signed)
OFFICE PROGRESS NOTE   01/03/2014   Physicians:Emma Barrington Ellison, Olivia Benson (PCP), Mardene Speak, M.Wert, Benson.Nahser, M.Stark/ J.Mann  INTERVAL HISTORY:  Patient is seen, together with significant other, continuing neoadjuvant chemotherapy for advanced gyn carcinoma which presented with malignant ascites. She had cycle 2 carboplatin taxol on 12-28-13 and will have neulasta today, timing due to severity of taxol aches. ANC is 1.0 today.  CA125 had decreased prior to cycle 2, by new lab [id number] 382 as compared with 1938 as baseline for chemo.  Patient "was not worse" after this second treatment, tho she has had problems with aches, restless legs and leg cramps, and constipation. Aches were worst in legs, some better today. She was constipated x one week prior to bowels finally moving today She has had no nausea. She did not have flushing from steroids this cycle. Blood sugars were only ~ 140 after chemo and was 102 fasting this AM; she has not had symptomatic hypoglycemia. She cannot tell that ascites has increased since last paracentesis of 2.2 liters on 12-20-13, and the stress urinary incontinence is improved. She has no symptoms of respiratory infection now.   Restless legs were severe during chemo, likely due to benadryl premed. She continues with very bothersome restless legs and leg cramps at night, both present long before cancer diagnosis, but very little sleep due to these "I walk 2 miles a night just moving in bed". She has not tried medication for restless legs. She has not tried gatorade daily, but will do this. She cannot tolerate tonic water now, tho this helped somewhat with leg cramps prior to cancer diagnosis.  She does not have PAC She had flu vaccine. She is interested in genetics referral, which we will set up when possible.   ONCOLOGIC HISTORY Patient initially had problems in ~ July 2015 with stress urinary incontinence related to severe chronic cough. Urine culture by PCP showed  no infection and she was referred to Dr Clois Comber, who adjusted medications with great improvement in the cough. Despite improved cough, she continued with some urinary incontinence. She saw Dr Ronita Hipps in 10-2013, with pelvic mass on exam and CA 125 1195 on 11-08-13. She had CT AP 11-12-13, which showed large ascites, irregular and shrunken liver, mild splenomegaly, increased soft tissue in pelvis bilaterally and anterior mid and lower peritoneum, small left pleural effusion. She saw Dr Denman George in consultation on 11-15-13, who agreed that this was likely gyn malignancy. She had US paracentesis of 4.1 liters on 11-18-13, with cytology ([id number]) and immunohistochemical stains consistent with serous carcinoma of Mullerian tract primary, favor ovarian. She had second paracentesis for 3.6 liters on 12-03-13. Gyn oncology recommended initial treatment with chemotherapy. She had first carboplatin and taxol on 12-07-13, with carboplatin dosed at AUC=4 and taxol at 135 mg/m2 due to concern for cirrhosis  Review of systems as above, also: No fever. Cough back to good recent baseline, continuing all interventions by Dr [name]. No increase in peripheral neuropathy. No bleeding or unusual bruising. No abdominal pain. Emotional , cries easily; this seems worse with sleep deprivation. Remainder of 10 point Review of Systems negative.  Objective:  Vital signs in last 24 hours:  BP 148/69 mmHg  Pulse 66  Temp(Src) 97.4 F (36.3 C) (Oral)  Resp 20  Ht 5' 1.5" (1.562 m)  Wt 171 lb (77.565 kg)  BMI 31.79 kg/m2 weight is down 5 lbs  Alert, oriented and appropriate. Ambulatory without difficulty.  Alopecia  HEENT:PERRL, sclerae not icteric. Oral mucosa moist  without lesions, posterior pharynx clear.  Neck supple. No JVD.  Lymphatics:no cervical,suraclavicular,  or inguinal adenopathy Resp: clear to auscultation bilaterally and normal percussion bilaterally Cardio: regular rate and rhythm. No gallop. GI: soft,  nontender, not as distended and not tight, no appreciable mass or organomegaly. A few bowel sounds.  Musculoskeletal/ Extremities: without pitting edema, cords, tenderness Neuro: no peripheral neuropathy. Otherwise nonfocal. PSYCH appropriate mood and affect Skin without rash, ecchymosis, petechiae   Lab Results:  Results for orders placed or performed in visit on 01/03/14  CBC with Differential  Result Value Ref Range   WBC 1.8 (L) 3.9 - 10.3 10e3/uL   NEUT# 1.0 (L) 1.5 - 6.5 10e3/uL   HGB 11.4 (L) 11.6 - 15.9 g/dL   HCT 35.0 34.8 - 46.6 %   Platelets 142 (L) 145 - 400 10e3/uL   MCV 86.6 79.5 - 101.0 fL   MCH 28.2 25.1 - 34.0 pg   MCHC 32.6 31.5 - 36.0 g/dL   RBC 4.04 3.70 - 5.45 10e6/uL   RDW 18.4 (H) 11.2 - 14.5 %   lymph# 0.7 (L) 0.9 - 3.3 10e3/uL   MONO# 0.0 (L) 0.1 - 0.9 10e3/uL   Eosinophils Absolute 0.0 0.0 - 0.5 10e3/uL   Basophils Absolute 0.0 0.0 - 0.1 10e3/uL   NEUT% 56.8 38.4 - 76.8 %   LYMPH% 38.3 14.0 - 49.7 %   MONO% 1.6 0.0 - 14.0 %   EOS% 2.2 0.0 - 7.0 %   BASO% 1.1 0.0 - 2.0 %  Comprehensive metabolic panel (Cmet) - [facility]  Result Value Ref Range   Sodium 140 136 - 145 mEq/L   Potassium 4.4 3.5 - 5.1 mEq/L   Chloride 108 98 - 109 mEq/L   CO2 25 22 - 29 mEq/L   Glucose 122 70 - 140 mg/dl   BUN 12.2 7.0 - 26.0 mg/dL   Creatinine 0.8 0.6 - 1.1 mg/dL   Total Bilirubin 0.56 0.20 - 1.20 mg/dL   Alkaline Phosphatase 153 (H) 40 - 150 U/L   AST 67 (H) 5 - 34 U/L   ALT 76 (H) 0 - 55 U/L   Total Protein 8.2 6.4 - 8.3 g/dL   Albumin 3.6 3.5 - 5.0 g/dL   Calcium 8.9 8.4 - 10.4 mg/dL   Anion Gap 7 3 - 11 mEq/L  Protime-INR  Result Value Ref Range   Protime 14.4 (H) 10.6 - 13.4 Seconds   INR 1.20 (L) 2.00 - 3.50   Lovenox No    CA 125 from 12-27-13 down to 402 from 1429 by old lab method and down to 762 from 1938 by new lab method.  Studies/Results:  No results found.  Medications: I have reviewed the patient's current medications. Instructions written and  oral as follows: Use Senokot S 1-2 tablets once or twice daily to keep bowels moving daily. This is mild laxative + stool softener Try ativan (lorazepam) 1 mg at bedtime tonight to see if this lets you sleep any better. It will last ~ 4 hrs/ at most 6 hrs if so. Drink medium size bottle of gatorade or other sports drink daily, for the leg cramps No chlortrimeton for now, as it is close to Benadryl and you seem to have worse restless legs with benadryl The prochlorperazine (compazine) for nausea sometimes worsens restless legs, so if you do need it for nausea be sure to tell Dr Marko Plume if the restlessness is worse after it.  Benadryl on chemo orders also changed to 25  mg po >30 min prior to treatment; if ativan helpful at hs for restless legs, can use this also with chemo (added to chemo orders as prn now).  DISCUSSION: discussed [place] just at neutropenic range today, neulasta to be given. She understands that she may ache from the neulasta. Discussed neutropenic precautions and when to call office. She will be with family in [place] for Thanksgiving, all of whom are aware that she should not be with anyone who is ill.  Assessment/Plan:  1.high grade serous gyn carcinoma, clinically IIIC ovarian primary: Neoadjuvant chemotherapy with carboplatin and taxol begun 12-07-13, drugs dose reduced due to concern re cirrhosis, Neulasta 12-13-13. Cycle 2  12-28-13 without dose increase, ANC 1.0 today with neulasta. I will see her with labs 01-13-14 and she will have cycle 3 on 01-18-14. Clinically and by marker seems to be having some improvement from chemo. 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 Malignant ascites, other studies also sent with last paracentesis as requested by Dr Fuller Plan. 3.bronchitis symptoms and fever resolved with azithromycin 4.diabetes on oral agent. She is now checking blood sugars. 5.stress urinary incontinence likely related to  pelvic mass, symptoms some better 6.chronic cough: better with medication adjustments by Dr [name]. Past tobacco 7. 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 Platelets not at nadir this cycle yet. 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 Will try to assist her in getting off of supplemental estrogen after rest of situation is more stable 11.narrow angle glaucoma  Patient and friend know to call if needed prior to next scheduled appointments. They understand all instructions and are in agreement with plan. Time spent 25 min including >50% counseling and coordination of care.    Olivia Frede Benson, Olivia Benson   01/03/2014, 12:10 PM

## 2014-01-05 ENCOUNTER — Other Ambulatory Visit: Payer: Self-pay | Admitting: Internal Medicine

## 2014-01-07 ENCOUNTER — Other Ambulatory Visit: Payer: Self-pay | Admitting: Oncology

## 2014-01-07 DIAGNOSIS — C569 Malignant neoplasm of unspecified ovary: Secondary | ICD-10-CM

## 2014-01-12 ENCOUNTER — Other Ambulatory Visit: Payer: Medicare Other

## 2014-01-12 ENCOUNTER — Ambulatory Visit: Payer: Medicare Other | Admitting: Oncology

## 2014-01-13 ENCOUNTER — Encounter: Payer: Self-pay | Admitting: Oncology

## 2014-01-13 ENCOUNTER — Telehealth: Payer: Self-pay | Admitting: Oncology

## 2014-01-13 ENCOUNTER — Other Ambulatory Visit: Payer: Self-pay

## 2014-01-13 ENCOUNTER — Telehealth: Payer: Self-pay | Admitting: Internal Medicine

## 2014-01-13 ENCOUNTER — Ambulatory Visit (HOSPITAL_BASED_OUTPATIENT_CLINIC_OR_DEPARTMENT_OTHER): Payer: Medicare Other | Admitting: Oncology

## 2014-01-13 ENCOUNTER — Other Ambulatory Visit (HOSPITAL_BASED_OUTPATIENT_CLINIC_OR_DEPARTMENT_OTHER): Payer: Medicare Other

## 2014-01-13 VITALS — BP 135/60 | HR 64 | Temp 98.5°F | Resp 18 | Ht 61.5 in | Wt 174.9 lb

## 2014-01-13 DIAGNOSIS — D6959 Other secondary thrombocytopenia: Secondary | ICD-10-CM

## 2014-01-13 DIAGNOSIS — C569 Malignant neoplasm of unspecified ovary: Secondary | ICD-10-CM

## 2014-01-13 DIAGNOSIS — R180 Malignant ascites: Secondary | ICD-10-CM

## 2014-01-13 DIAGNOSIS — K7460 Unspecified cirrhosis of liver: Secondary | ICD-10-CM

## 2014-01-13 DIAGNOSIS — K7581 Nonalcoholic steatohepatitis (NASH): Secondary | ICD-10-CM

## 2014-01-13 LAB — COMPREHENSIVE METABOLIC PANEL (CC13)
ALT: 39 U/L (ref 0–55)
AST: 34 U/L (ref 5–34)
Albumin: 3.5 g/dL (ref 3.5–5.0)
Alkaline Phosphatase: 157 U/L — ABNORMAL HIGH (ref 40–150)
Anion Gap: 9 mEq/L (ref 3–11)
BUN: 9.7 mg/dL (ref 7.0–26.0)
CO2: 25 mEq/L (ref 22–29)
Calcium: 9 mg/dL (ref 8.4–10.4)
Chloride: 107 mEq/L (ref 98–109)
Creatinine: 0.8 mg/dL (ref 0.6–1.1)
EGFR: 78 mL/min/{1.73_m2} — ABNORMAL LOW (ref >90)
Glucose: 96 mg/dl (ref 70–140)
Potassium: 3.9 mEq/L (ref 3.5–5.1)
Sodium: 141 mEq/L (ref 136–145)
Total Bilirubin: 0.55 mg/dL (ref 0.20–1.20)
Total Protein: 7.6 g/dL (ref 6.4–8.3)

## 2014-01-13 LAB — CBC WITH DIFFERENTIAL/PLATELET
BASO%: 1.1 % (ref 0.0–2.0)
Basophils Absolute: 0.1 10*3/uL (ref 0.0–0.1)
EOS%: 0.6 % (ref 0.0–7.0)
Eosinophils Absolute: 0 10*3/uL (ref 0.0–0.5)
HCT: 33.7 % — ABNORMAL LOW (ref 34.8–46.6)
HGB: 10.6 g/dL — ABNORMAL LOW (ref 11.6–15.9)
LYMPH%: 16.3 % (ref 14.0–49.7)
MCH: 27.9 pg (ref 25.1–34.0)
MCHC: 31.4 g/dL — ABNORMAL LOW (ref 31.5–36.0)
MCV: 88.8 fL (ref 79.5–101.0)
MONO#: 0.3 10*3/uL (ref 0.1–0.9)
MONO%: 4 % (ref 0.0–14.0)
NEUT#: 5.5 10*3/uL (ref 1.5–6.5)
NEUT%: 78 % — ABNORMAL HIGH (ref 38.4–76.8)
Platelets: 81 10*3/uL — ABNORMAL LOW (ref 145–400)
RBC: 3.8 10*6/uL (ref 3.70–5.45)
RDW: 20.2 % — ABNORMAL HIGH (ref 11.2–14.5)
WBC: 7.1 10*3/uL (ref 3.9–10.3)
lymph#: 1.2 10*3/uL (ref 0.9–3.3)

## 2014-01-13 MED ORDER — DEXAMETHASONE 4 MG PO TABS: ORAL_TABLET | ORAL | Status: DC

## 2014-01-13 MED ORDER — TRAMADOL HCL 50 MG PO TABS: 50.0000 mg | ORAL_TABLET | Freq: Three times a day (TID) | ORAL | Status: DC | PRN

## 2014-01-13 MED ORDER — BISOPROLOL FUMARATE 5 MG PO TABS: ORAL_TABLET | ORAL | Status: DC

## 2014-01-13 NOTE — Telephone Encounter (Signed)
Pt needs refill on bisoprolol 74m .5 tab qd.  Refill sent.  Nothing further needed.

## 2014-01-13 NOTE — Progress Notes (Signed)
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 ANC was 1.0 by day 7 cycle 2, neulasta given then (delay due to taxol aches). She is due cycle 3 on 01-18-14. Last paracentesis was 12-20-13. She will probably need to see gyn onc in Jan, that visit not scheduled yet.  Patient was more fatigued for past week, not able to go to family Thanksgiving. Nausea has not been too bothersome and bowels have been moving. Severe restless legs were much better with tramadol at evening meal and ativan 1 mg at hs for ~ 2 nights, however she felt too sedated the next day with this. She did not use medication last PM, restless legs very bothersome again. She does not feel uncomfortably full from ascites. No peripheral neuropathy. She has had no bleeding, no fever, no LE swelling.   She does not have PAC She had flu vaccine. She is interested in genetics referral  Tells me that this is difficult time of year for her, as son died 01/21/23 04/12/2002).  ONCOLOGIC HISTORY Patient initially had problems in ~ July 2015 with stress urinary incontinence related to severe chronic cough. Urine culture by PCP showed no infection and she was referred to Dr Clois Comber, who adjusted medications with great improvement in the cough. Despite improved cough, she continued with some urinary incontinence. She saw Dr Ronita Hipps in 10-2013, with pelvic mass on exam and CA 125 1195 on 11-08-13. She had CT AP 11-12-13, which showed large ascites, irregular and shrunken liver, mild splenomegaly, increased soft tissue in pelvis bilaterally and anterior mid and lower peritoneum, small left pleural effusion. She saw Dr Denman George in consultation on 11-15-13, who agreed that this was likely gyn malignancy. She had US  paracentesis of 4.1 liters on 11-18-13, with cytology ([id number]) and immunohistochemical stains consistent with serous carcinoma of Mullerian tract primary, favor ovarian. She had second paracentesis for 3.6 liters on 12-03-13. Gyn oncology recommended initial treatment with chemotherapy. She had first carboplatin and taxol on 12-07-13, with carboplatin dosed at AUC=4 and taxol at 135 mg/m2 due to concern for cirrhosis   Review of systems as above, also: No cough, not SOB with present activity. More blurred vision, is due eye exam for known narrow angle glaucoma and will try to do this week. "Rash" on scalp and upper arms, puritic on arms. Skin very dry, not using any lotion. No fever or other symptoms of infection. Remainder of 10 point Review of Systems negative.  Objective:  Vital signs in last 24 hours:  BP 135/60 mmHg  Pulse 64  Temp(Src) 98.5 F (36.9 C) (Oral)  Resp 18  Ht 5' 1.5" (1.562 m)  Wt 174 lb 14.4 oz (79.334 kg)  BMI 32.52 kg/m2  SpO2 100%  Weight up 3 lbs. Alert, oriented and appropriate. Ambulatory without difficulty.  Alopecia  HEENT:PERRL, sclerae not icteric. Oral mucosa moist without lesions, posterior pharynx clear.  Neck supple. No JVD.  Lymphatics:no cervical,supraclavicular or inguinal adenopathy Resp: clear to auscultation bilaterally and normal percussion bilaterally Cardio: regular rate and rhythm. No gallop. GI: soft, nontender, full but not tight including upper abdomen. Some bowel sounds.  Musculoskeletal/ Extremities: without pitting edema, cords, tenderness Neuro: no peripheral neuropathy. Otherwise nonfocal. PSYCH appropriate mood and affect Skin: scalp with scattered folliculitis. Skin thruout very dry.  Upper arms with some excoriations, slight nonerythematous "sandpaper" ;  without ecchymosis, petechiae  Lab Results:  Results for orders placed or performed in visit on 01/13/14  CBC with Differential  Result Value Ref Range   WBC 7.1 3.9 -  10.3 10e3/uL   NEUT# 5.5 1.5 - 6.5 10e3/uL   HGB 10.6 (L) 11.6 - 15.9 g/dL   HCT 33.7 (L) 34.8 - 46.6 %   Platelets 81 (L) 145 - 400 10e3/uL   MCV 88.8 79.5 - 101.0 fL   MCH 27.9 25.1 - 34.0 pg   MCHC 31.4 (L) 31.5 - 36.0 g/dL   RBC 3.80 3.70 - 5.45 10e6/uL   RDW 20.2 (H) 11.2 - 14.5 %   lymph# 1.2 0.9 - 3.3 10e3/uL   MONO# 0.3 0.1 - 0.9 10e3/uL   Eosinophils Absolute 0.0 0.0 - 0.5 10e3/uL   Basophils Absolute 0.1 0.0 - 0.1 10e3/uL   NEUT% 78.0 (H) 38.4 - 76.8 %   LYMPH% 16.3 14.0 - 49.7 %   MONO% 4.0 0.0 - 14.0 %   EOS% 0.6 0.0 - 7.0 %   BASO% 1.1 0.0 - 2.0 %    CMET available after visit normal with exception of AP 157  CA 125 available after visit down to 227, this having been 762 on 11-16 and 1938 on 12-01-13.  Studies/Results:  No results found.  Medications: I have reviewed the patient's current medications. Decadron and tramadol refilled. For restless legs she will try 1/2 ativan at hs +/- tramadol.   DISCUSSION: not clear to me if she was overly sedated from meds for restless legs (note cirrhosis) or just exhausted and finally able to sleep; will try lower doses as above. For folliculitis on scalp related to hair loss, will wash with baby shampoo and use neosporin topical. For dry skin will avoid hot shower and use lotion liberally on moist skin; will use OTC hydrocortisone sparingly bid to puritic areas upper arms.  She will contact ophthalmologist, as she is due eye exam. She should not have blurred vision this far out from steroid doses. Will get labs on 12-7, treat 12-8 if ANC >=1.5 and plt >=100k.  Assessment/Plan:  1.high grade serous gyn carcinoma, clinically IIIC ovarian primary: Neoadjuvant chemotherapy with carboplatin and taxol begun 12-07-13, with neulasta. Ca 125 dropping well, ascites does not seem to be accumulating again. Will have cycle 3 on 12-8 as long as counts ok 12-7; will use today's chemistries for 12-8 chemo. I will see her back on day 6 with  neulasta then. 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 Malignant ascites, other studies also sent with last paracentesis as requested by Dr Fuller Plan. 3.Scalp folliculitis and dry skin: plan as above 4.diabetes on oral agent. She is now checking blood sugars. 5.stress urinary incontinence likely related to pelvic mass, symptoms some better 6.chronic cough: better with medication adjustments by Dr [name]. Past tobacco 7. 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 No bleeding. 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 Will try to assist her in getting off of supplemental estrogen after rest of situation is more stable 11.narrow angle glaucoma, blurred vision, due eye exam which she will try to do this week 12.restless legs: severe, interferes greatly with sleep. Plan as above.  Chemo and neulasta orders confirmed. RN to let patient know CBC results on 12-7. Patient understands discussion and is in  agreement with plans above. Time spent 30 min including >50% counseling and coordination of care.  Edlyn Rosenburg P, MD   01/13/2014, 11:16 AM

## 2014-01-13 NOTE — Telephone Encounter (Signed)
, °

## 2014-01-14 LAB — CA 125: CA 125: 227 U/mL — ABNORMAL HIGH (ref <35)

## 2014-01-17 ENCOUNTER — Encounter: Payer: Self-pay | Admitting: *Deleted

## 2014-01-17 ENCOUNTER — Other Ambulatory Visit (HOSPITAL_BASED_OUTPATIENT_CLINIC_OR_DEPARTMENT_OTHER): Payer: Medicare Other

## 2014-01-17 ENCOUNTER — Telehealth: Payer: Self-pay

## 2014-01-17 DIAGNOSIS — D6959 Other secondary thrombocytopenia: Secondary | ICD-10-CM

## 2014-01-17 DIAGNOSIS — C569 Malignant neoplasm of unspecified ovary: Secondary | ICD-10-CM

## 2014-01-17 LAB — CBC WITH DIFFERENTIAL/PLATELET
BASO%: 1.4 % (ref 0.0–2.0)
Basophils Absolute: 0.1 10*3/uL (ref 0.0–0.1)
EOS%: 1 % (ref 0.0–7.0)
Eosinophils Absolute: 0 10*3/uL (ref 0.0–0.5)
HCT: 34.1 % — ABNORMAL LOW (ref 34.8–46.6)
HGB: 10.9 g/dL — ABNORMAL LOW (ref 11.6–15.9)
LYMPH%: 21.7 % (ref 14.0–49.7)
MCH: 28.6 pg (ref 25.1–34.0)
MCHC: 31.9 g/dL (ref 31.5–36.0)
MCV: 89.7 fL (ref 79.5–101.0)
MONO#: 0.2 10*3/uL (ref 0.1–0.9)
MONO%: 4.9 % (ref 0.0–14.0)
NEUT#: 3.3 10*3/uL (ref 1.5–6.5)
NEUT%: 71 % (ref 38.4–76.8)
Platelets: 123 10*3/uL — ABNORMAL LOW (ref 145–400)
RBC: 3.8 10*6/uL (ref 3.70–5.45)
RDW: 20.3 % — ABNORMAL HIGH (ref 11.2–14.5)
WBC: 4.7 10*3/uL (ref 3.9–10.3)
lymph#: 1 10*3/uL (ref 0.9–3.3)

## 2014-01-17 MED ORDER — LORAZEPAM 1 MG PO TABS: ORAL_TABLET | ORAL | Status: DC

## 2014-01-17 NOTE — Telephone Encounter (Signed)
Told Olivia Benson the results of her Ca-125 from 01-14-14 as noted below by Dr. Marko Plume. Told Olivia Benson that her CBC is fine for her treatment tomorrow 01-18-14.  H  Her Platlets were up to 123 K from 81K on 01-14-14.  Patient verbalized understanding.   She also stated that she needs a refill on her ativan for nausea.  Prescription called in to her pharmacy.

## 2014-01-17 NOTE — Telephone Encounter (Signed)
-----   Message from Gordy Levan, MD sent at 01/15/2014  8:25 PM EST ----- Labs seen and need follow up: she will have CBC on 12-7, need to let her know if ok for Rx 12-8. When you let her know counts, please tell her that CA 125 is down to 227, from 1938 prior to first chemo.

## 2014-01-17 NOTE — Progress Notes (Signed)
RECEIVED A FAX FROM [facility] CONCERNING A PRIOR AUTHORIZATION FOR LORAZEPAM. THIS REQUEST WAS PLACED IN THE MANAGED CARE BIN.

## 2014-01-18 ENCOUNTER — Other Ambulatory Visit: Payer: Medicare Other

## 2014-01-18 ENCOUNTER — Ambulatory Visit (HOSPITAL_BASED_OUTPATIENT_CLINIC_OR_DEPARTMENT_OTHER): Payer: Medicare Other

## 2014-01-18 DIAGNOSIS — Z5111 Encounter for antineoplastic chemotherapy: Secondary | ICD-10-CM

## 2014-01-18 DIAGNOSIS — C569 Malignant neoplasm of unspecified ovary: Secondary | ICD-10-CM

## 2014-01-18 MED ORDER — SODIUM CHLORIDE 0.9 % IV SOLN
Freq: Once | INTRAVENOUS | Status: AC
  Administered 2014-01-18: 09:00:00 via INTRAVENOUS

## 2014-01-18 MED ORDER — DIPHENHYDRAMINE HCL 25 MG PO CAPS
25.0000 mg | ORAL_CAPSULE | Freq: Once | ORAL | Status: AC
  Administered 2014-01-18: 25 mg via ORAL

## 2014-01-18 MED ORDER — LORAZEPAM 1 MG PO TABS: 1.0000 mg | ORAL_TABLET | Freq: Once | ORAL | Status: DC | PRN

## 2014-01-18 MED ORDER — PACLITAXEL CHEMO INJECTION 300 MG/50ML
135.0000 mg/m2 | Freq: Once | INTRAVENOUS | Status: AC
  Administered 2014-01-18: 258 mg via INTRAVENOUS
  Filled 2014-01-18: qty 43

## 2014-01-18 MED ORDER — DIPHENHYDRAMINE HCL 25 MG PO CAPS
ORAL_CAPSULE | ORAL | Status: AC
  Filled 2014-01-18: qty 1

## 2014-01-18 MED ORDER — DEXAMETHASONE SODIUM PHOSPHATE 20 MG/5ML IJ SOLN
INTRAMUSCULAR | Status: AC
  Filled 2014-01-18: qty 5

## 2014-01-18 MED ORDER — FAMOTIDINE IN NACL 20-0.9 MG/50ML-% IV SOLN
INTRAVENOUS | Status: AC
  Filled 2014-01-18: qty 50

## 2014-01-18 MED ORDER — SODIUM CHLORIDE 0.9 % IV SOLN
389.6000 mg | Freq: Once | INTRAVENOUS | Status: AC
  Administered 2014-01-18: 390 mg via INTRAVENOUS
  Filled 2014-01-18: qty 39

## 2014-01-18 MED ORDER — DEXAMETHASONE SODIUM PHOSPHATE 20 MG/5ML IJ SOLN
20.0000 mg | Freq: Once | INTRAMUSCULAR | Status: AC
  Administered 2014-01-18: 20 mg via INTRAVENOUS

## 2014-01-18 MED ORDER — FAMOTIDINE IN NACL 20-0.9 MG/50ML-% IV SOLN
20.0000 mg | Freq: Once | INTRAVENOUS | Status: AC
  Administered 2014-01-18: 20 mg via INTRAVENOUS

## 2014-01-18 MED ORDER — ONDANSETRON 16 MG/50ML IVPB (CHCC)
16.0000 mg | Freq: Once | INTRAVENOUS | Status: AC
  Administered 2014-01-18: 16 mg via INTRAVENOUS

## 2014-01-18 MED ORDER — DIPHENHYDRAMINE HCL 50 MG/ML IJ SOLN: 25.0000 mg | Freq: Once | INTRAMUSCULAR | Status: DC

## 2014-01-18 MED ORDER — ONDANSETRON 16 MG/50ML IVPB (CHCC)
INTRAVENOUS | Status: AC
  Filled 2014-01-18: qty 16

## 2014-01-18 NOTE — Patient Instructions (Signed)
[place] Discharge Instructions for Patients Receiving Chemotherapy  Today you received the following chemotherapy agents: Taxol and Carboplatin.  To help prevent nausea and vomiting after your treatment, we encourage you to take your nausea medication as prescribed.   If you develop nausea and vomiting that is not controlled by your nausea medication, call the clinic.   BELOW ARE SYMPTOMS THAT SHOULD BE REPORTED IMMEDIATELY:  *FEVER GREATER THAN 100.5 F  *CHILLS WITH OR WITHOUT FEVER  NAUSEA AND VOMITING THAT IS NOT CONTROLLED WITH YOUR NAUSEA MEDICATION  *UNUSUAL SHORTNESS OF BREATH  *UNUSUAL BRUISING OR BLEEDING  TENDERNESS IN MOUTH AND THROAT WITH OR WITHOUT PRESENCE OF ULCERS  *URINARY PROBLEMS  *BOWEL PROBLEMS  UNUSUAL RASH Items with * indicate a potential emergency and should be followed up as soon as possible.  Feel free to call the clinic you have any questions or concerns. The clinic phone number is [phone].

## 2014-01-20 ENCOUNTER — Encounter: Payer: Self-pay | Admitting: *Deleted

## 2014-01-20 ENCOUNTER — Encounter: Payer: Self-pay | Admitting: Hematology and Oncology

## 2014-01-20 NOTE — Progress Notes (Signed)
[employer] approved lorazepam from 01/12/14-01/13/15

## 2014-01-21 ENCOUNTER — Other Ambulatory Visit: Payer: Self-pay

## 2014-01-21 DIAGNOSIS — C569 Malignant neoplasm of unspecified ovary: Secondary | ICD-10-CM

## 2014-01-23 ENCOUNTER — Other Ambulatory Visit: Payer: Self-pay | Admitting: Oncology

## 2014-01-23 DIAGNOSIS — C569 Malignant neoplasm of unspecified ovary: Secondary | ICD-10-CM

## 2014-01-24 ENCOUNTER — Telehealth: Payer: Self-pay | Admitting: Oncology

## 2014-01-24 ENCOUNTER — Encounter: Payer: Self-pay | Admitting: Oncology

## 2014-01-24 ENCOUNTER — Other Ambulatory Visit: Payer: Self-pay | Admitting: *Deleted

## 2014-01-24 ENCOUNTER — Ambulatory Visit (HOSPITAL_BASED_OUTPATIENT_CLINIC_OR_DEPARTMENT_OTHER): Payer: Medicare Other | Admitting: Oncology

## 2014-01-24 ENCOUNTER — Ambulatory Visit: Payer: Medicare Other

## 2014-01-24 ENCOUNTER — Other Ambulatory Visit (HOSPITAL_BASED_OUTPATIENT_CLINIC_OR_DEPARTMENT_OTHER): Payer: Medicare Other

## 2014-01-24 VITALS — BP 127/55 | HR 72 | Temp 97.8°F | Resp 20 | Ht 61.5 in | Wt 172.4 lb

## 2014-01-24 DIAGNOSIS — G2581 Restless legs syndrome: Secondary | ICD-10-CM

## 2014-01-24 DIAGNOSIS — M79609 Pain in unspecified limb: Secondary | ICD-10-CM

## 2014-01-24 DIAGNOSIS — C801 Malignant (primary) neoplasm, unspecified: Secondary | ICD-10-CM

## 2014-01-24 DIAGNOSIS — K7460 Unspecified cirrhosis of liver: Secondary | ICD-10-CM

## 2014-01-24 DIAGNOSIS — E119 Type 2 diabetes mellitus without complications: Secondary | ICD-10-CM

## 2014-01-24 DIAGNOSIS — C569 Malignant neoplasm of unspecified ovary: Secondary | ICD-10-CM

## 2014-01-24 DIAGNOSIS — D701 Agranulocytosis secondary to cancer chemotherapy: Secondary | ICD-10-CM

## 2014-01-24 DIAGNOSIS — D6959 Other secondary thrombocytopenia: Secondary | ICD-10-CM

## 2014-01-24 DIAGNOSIS — T451X5A Adverse effect of antineoplastic and immunosuppressive drugs, initial encounter: Secondary | ICD-10-CM

## 2014-01-24 DIAGNOSIS — K7581 Nonalcoholic steatohepatitis (NASH): Secondary | ICD-10-CM

## 2014-01-24 DIAGNOSIS — R5383 Other fatigue: Secondary | ICD-10-CM

## 2014-01-24 DIAGNOSIS — R180 Malignant ascites: Secondary | ICD-10-CM

## 2014-01-24 DIAGNOSIS — C561 Malignant neoplasm of right ovary: Secondary | ICD-10-CM

## 2014-01-24 LAB — CBC WITH DIFFERENTIAL/PLATELET
BASO%: 1.1 % (ref 0.0–2.0)
Basophils Absolute: 0 10*3/uL (ref 0.0–0.1)
EOS%: 3.2 % (ref 0.0–7.0)
Eosinophils Absolute: 0 10*3/uL (ref 0.0–0.5)
HCT: 34.2 % — ABNORMAL LOW (ref 34.8–46.6)
HGB: 10.9 g/dL — ABNORMAL LOW (ref 11.6–15.9)
LYMPH%: 51.1 % — ABNORMAL HIGH (ref 14.0–49.7)
MCH: 28.9 pg (ref 25.1–34.0)
MCHC: 32 g/dL (ref 31.5–36.0)
MCV: 90.5 fL (ref 79.5–101.0)
MONO#: 0 10*3/uL — ABNORMAL LOW (ref 0.1–0.9)
MONO%: 1.5 % (ref 0.0–14.0)
NEUT#: 0.6 10*3/uL — ABNORMAL LOW (ref 1.5–6.5)
NEUT%: 43.1 % (ref 38.4–76.8)
Platelets: 111 10*3/uL — ABNORMAL LOW (ref 145–400)
RBC: 3.78 10*6/uL (ref 3.70–5.45)
RDW: 20.6 % — ABNORMAL HIGH (ref 11.2–14.5)
WBC: 1.5 10*3/uL — ABNORMAL LOW (ref 3.9–10.3)
lymph#: 0.8 10*3/uL — ABNORMAL LOW (ref 0.9–3.3)

## 2014-01-24 LAB — COMPREHENSIVE METABOLIC PANEL (CC13)
ALT: 81 U/L — ABNORMAL HIGH (ref 0–55)
AST: 57 U/L — ABNORMAL HIGH (ref 5–34)
Albumin: 3.7 g/dL (ref 3.5–5.0)
Alkaline Phosphatase: 129 U/L (ref 40–150)
Anion Gap: 10 mEq/L (ref 3–11)
BUN: 13.3 mg/dL (ref 7.0–26.0)
CO2: 23 mEq/L (ref 22–29)
Calcium: 8.8 mg/dL (ref 8.4–10.4)
Chloride: 105 mEq/L (ref 98–109)
Creatinine: 0.8 mg/dL (ref 0.6–1.1)
EGFR: 78 mL/min/{1.73_m2} — ABNORMAL LOW (ref >90)
Glucose: 140 mg/dl (ref 70–140)
Potassium: 4.1 mEq/L (ref 3.5–5.1)
Sodium: 138 mEq/L (ref 136–145)
Total Bilirubin: 0.53 mg/dL (ref 0.20–1.20)
Total Protein: 7.9 g/dL (ref 6.4–8.3)

## 2014-01-24 MED ORDER — PEGFILGRASTIM INJECTION 6 MG/0.6ML ~~LOC~~
6.0000 mg | PREFILLED_SYRINGE | Freq: Once | SUBCUTANEOUS | Status: AC
  Administered 2014-01-24: 6 mg via SUBCUTANEOUS
  Filled 2014-01-24: qty 0.6

## 2014-01-24 NOTE — Telephone Encounter (Signed)
per pof to sch pt appt-gave pt copy of sch-Dr Denman George appt sch

## 2014-01-24 NOTE — Progress Notes (Signed)
OFFICE PROGRESS NOTE   01/24/2014   Physicians:Emma Barrington Ellison, MD (PCP), Mardene Speak, M.Wert, P.Nahser, M.Stark/ J.Mann  INTERVAL HISTORY:  Patient is seen, together with significant other, as she continues neoadjuvant chemotherapy for advanced serous gyn carcinoma. She had cycle 3 carboplatin taxol on 01-18-14 and is for neulasta today. The delay in gCSF was intentional due to severity of taxol aches, however she is already neutropenic today (day 7 cycle 3).   Patient is not febrile and has had no symptoms of infection. Taxol aches and fatigue have been worse this cycle. Aches were most severe in legs and hips, some better now. She has not taken tramadol during the day due to concern about sedation; she has been able to sleep at night with ativan + tramadol at hs controlling restless legs. She did not try 1/2 dose ativan. Hot shower helps aches; she has jaccuzi, which would be fine to try. 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 She has had no nausea and has eaten more in last 2 days. She had constipation again after chemo, discussed adjusting senokot dose.  Folliculitis rash resolved on scalp with gentle shampoo and moisturizer; no skin irritation now on UE.   No PAC Flu vaccine done Patient would like genetics referral.  ONCOLOGIC HISTORY  Review of systems as above, also: No SOB at rest, no other respiratory symptoms. No bleeding. No LE swelling. No increased distension of abdomen. Up to void several times at night. Remainder of 10 point Review of Systems negative.  Objective:  Vital signs in last 24 hours:  BP 127/55 mmHg  Pulse 72  Temp(Src) 97.8 F (36.6 C) (Oral)  Resp 20  Ht 5' 1.5" (1.562 m)  Wt 172 lb 6.4 oz (78.2 kg)  BMI 32.05 kg/m2 Weight down 2 lbs. Alert, oriented and appropriate. Ambulatory without assistance.  Alopecia  HEENT:PERRL, sclerae not icteric. Oral mucosa moist without lesions, posterior pharynx  clear.  Neck supple. No JVD.  Lymphatics:no cervical,supraclavicular, axillary or inguinal adenopathy Resp: clear to auscultation bilaterally and normal percussion bilaterally Cardio: regular rate and rhythm. No gallop. GI: soft, nontender, still feels diffusely full, but not more distended, no clear organomegaly. Normally active bowel sounds.  Musculoskeletal/ Extremities: without pitting edema, cords, tenderness Neuro: no change peripheral neuropathy. Otherwise nonfocal Skin minimal residual rash on scalp, otherwise without rash, ecchymosis, petechiae. Skin much less dry.   Lab Results:  Results for orders placed or performed in visit on 01/24/14  CBC with Differential  Result Value Ref Range   WBC 1.5 (L) 3.9 - 10.3 10e3/uL   NEUT# 0.6 (L) 1.5 - 6.5 10e3/uL   HGB 10.9 (L) 11.6 - 15.9 g/dL   HCT 34.2 (L) 34.8 - 46.6 %   Platelets 111 (L) 145 - 400 10e3/uL   MCV 90.5 79.5 - 101.0 fL   MCH 28.9 25.1 - 34.0 pg   MCHC 32.0 31.5 - 36.0 g/dL   RBC 3.78 3.70 - 5.45 10e6/uL   RDW 20.6 (H) 11.2 - 14.5 %   lymph# 0.8 (L) 0.9 - 3.3 10e3/uL   MONO# 0.0 (L) 0.1 - 0.9 10e3/uL   Eosinophils Absolute 0.0 0.0 - 0.5 10e3/uL   Basophils Absolute 0.0 0.0 - 0.1 10e3/uL   NEUT% 43.1 38.4 - 76.8 %   LYMPH% 51.1 (H) 14.0 - 49.7 %   MONO% 1.5 0.0 - 14.0 %   EOS% 3.2 0.0 - 7.0 %   BASO% 1.1 0.0 - 2.0 %  Comprehensive metabolic panel (Cmet) - [facility]  Result Value Ref Range   Sodium 138 136 - 145 mEq/L   Potassium 4.1 3.5 - 5.1 mEq/L   Chloride 105 98 - 109 mEq/L   CO2 23 22 - 29 mEq/L   Glucose 140 70 - 140 mg/dl   BUN 13.3 7.0 - 26.0 mg/dL   Creatinine 0.8 0.6 - 1.1 mg/dL   Total Bilirubin 0.53 0.20 - 1.20 mg/dL   Alkaline Phosphatase 129 40 - 150 U/L   AST 57 (H) 5 - 34 U/L   ALT 81 (H) 0 - 55 U/L   Total Protein 7.9 6.4 - 8.3 g/dL   Albumin 3.7 3.5 - 5.0 g/dL   Calcium 8.8 8.4 - 10.4 mg/dL   Anion Gap 10 3 - 11 mEq/L   EGFR 78 (L) >90 ml/min/1.73 m2    Last CA 125 227 on 01-13-14, down  from 1938 in Oct. This will be repeated with labs day prior to cycle 4 chemo.  Studies/Results:  No results found. Will repeat CT prior to seeing Dr Denman George after cycle 4.  Medications: I have reviewed the patient's current medications. OK to use tramadol during day if needed for aches. Adjust senokot 1-2 qd - bid prn, and try to get bowels to move well day prior to chemo. Can try 1/2 of ativan at hs.  DISCUSSION: CA 125, restaging CT and gyn onc re-evaluation as above, meds as above. Neutropenic precautions reviewed.  Assessment/Plan: 1.high grade serous gyn carcinoma, clinically IIIC ovarian primary: Neoadjuvant chemotherapy with carboplatin and taxol begun 12-07-13, with neulasta. Ca 125 dropping well, ascites not accumulating now. I will see her with labs 12-28 prior to cycle 4 on 12-29. She will have repeat CT shortly prior to seeing Dr Denman George again on 02-25-14. 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 Following liver function closely with chemo.  Malignant ascites, other studies also sent with last paracentesis as requested by Dr Fuller Plan. 3.chemotherapy related neutropenia: neulasta today, which may cause more aches. Neutropenic precautions. 4.diabetes on oral agent. She is now checking blood sugars. 5.stress urinary incontinence likely related to pelvic mass, symptoms some better 6.chronic cough: better with medications by Dr [name]. Past tobacco 7. 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 No bleeding. Counts likely not at nadir yet this cycle. Call if bleeding. 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 Will try to assist her in getting off of supplemental estrogen after rest of situation is more stable 11.narrow angle glaucoma: following with ophthalmology 12.restless legs: severe, interferes greatly with sleep, but much better with  ativan + tramadol last few nights. 94.WHQPR folliculitis and dry skin: improved   Patient and friend followed discussion well and are in agreement with plans above. Time spent 25 min including >50% counseling and coordination of care  Note CT order is associated with diagnosis of malignant neoplasm of right ovary,  even tho we do not have confirmation of laterality as she has not had surgery.  Discussed with IT and coding.   Gordy Levan, MD   01/24/2014, 12:46 PM

## 2014-01-25 DIAGNOSIS — R180 Malignant ascites: Secondary | ICD-10-CM | POA: Insufficient documentation

## 2014-01-25 DIAGNOSIS — G2581 Restless legs syndrome: Secondary | ICD-10-CM | POA: Insufficient documentation

## 2014-01-25 DIAGNOSIS — D701 Agranulocytosis secondary to cancer chemotherapy: Secondary | ICD-10-CM | POA: Insufficient documentation

## 2014-01-25 DIAGNOSIS — T451X5A Adverse effect of antineoplastic and immunosuppressive drugs, initial encounter: Secondary | ICD-10-CM | POA: Insufficient documentation

## 2014-01-25 DIAGNOSIS — D6959 Other secondary thrombocytopenia: Secondary | ICD-10-CM | POA: Insufficient documentation

## 2014-01-27 ENCOUNTER — Inpatient Hospital Stay (HOSPITAL_COMMUNITY)
Admission: EM | Admit: 2014-01-27 | Discharge: 2014-01-30 | DRG: 808 | Disposition: A | Discharge status: Home / Self Care | Payer: Medicare Other | Attending: Internal Medicine | Admitting: Internal Medicine

## 2014-01-27 ENCOUNTER — Encounter (HOSPITAL_COMMUNITY): Payer: Self-pay | Admitting: Emergency Medicine

## 2014-01-27 ENCOUNTER — Emergency Department (HOSPITAL_COMMUNITY): Payer: Medicare Other

## 2014-01-27 ENCOUNTER — Telehealth: Payer: Self-pay | Admitting: *Deleted

## 2014-01-27 DIAGNOSIS — Z87891 Personal history of nicotine dependence: Secondary | ICD-10-CM | POA: Diagnosis not present

## 2014-01-27 DIAGNOSIS — A419 Sepsis, unspecified organism: Secondary | ICD-10-CM | POA: Diagnosis present

## 2014-01-27 DIAGNOSIS — K7581 Nonalcoholic steatohepatitis (NASH): Secondary | ICD-10-CM | POA: Diagnosis present

## 2014-01-27 DIAGNOSIS — C569 Malignant neoplasm of unspecified ovary: Secondary | ICD-10-CM | POA: Diagnosis present

## 2014-01-27 DIAGNOSIS — R05 Cough: Secondary | ICD-10-CM

## 2014-01-27 DIAGNOSIS — E119 Type 2 diabetes mellitus without complications: Secondary | ICD-10-CM | POA: Diagnosis present

## 2014-01-27 DIAGNOSIS — Z8052 Family history of malignant neoplasm of bladder: Secondary | ICD-10-CM | POA: Diagnosis not present

## 2014-01-27 DIAGNOSIS — K766 Portal hypertension: Secondary | ICD-10-CM | POA: Diagnosis present

## 2014-01-27 DIAGNOSIS — D709 Neutropenia, unspecified: Secondary | ICD-10-CM | POA: Insufficient documentation

## 2014-01-27 DIAGNOSIS — Z6832 Body mass index (BMI) 32.0-32.9, adult: Secondary | ICD-10-CM

## 2014-01-27 DIAGNOSIS — K7460 Unspecified cirrhosis of liver: Secondary | ICD-10-CM | POA: Diagnosis present

## 2014-01-27 DIAGNOSIS — J45991 Cough variant asthma: Secondary | ICD-10-CM

## 2014-01-27 DIAGNOSIS — D61810 Antineoplastic chemotherapy induced pancytopenia: Principal | ICD-10-CM | POA: Diagnosis present

## 2014-01-27 DIAGNOSIS — D61818 Other pancytopenia: Secondary | ICD-10-CM | POA: Diagnosis present

## 2014-01-27 DIAGNOSIS — H4020X0 Unspecified primary angle-closure glaucoma, stage unspecified: Secondary | ICD-10-CM | POA: Diagnosis present

## 2014-01-27 DIAGNOSIS — R5081 Fever presenting with conditions classified elsewhere: Secondary | ICD-10-CM

## 2014-01-27 DIAGNOSIS — Z880 Allergy status to penicillin: Secondary | ICD-10-CM

## 2014-01-27 DIAGNOSIS — E669 Obesity, unspecified: Secondary | ICD-10-CM | POA: Diagnosis present

## 2014-01-27 DIAGNOSIS — Z825 Family history of asthma and other chronic lower respiratory diseases: Secondary | ICD-10-CM

## 2014-01-27 DIAGNOSIS — N3281 Overactive bladder: Secondary | ICD-10-CM | POA: Diagnosis present

## 2014-01-27 DIAGNOSIS — Z8249 Family history of ischemic heart disease and other diseases of the circulatory system: Secondary | ICD-10-CM | POA: Diagnosis not present

## 2014-01-27 DIAGNOSIS — Z823 Family history of stroke: Secondary | ICD-10-CM

## 2014-01-27 DIAGNOSIS — Z90710 Acquired absence of both cervix and uterus: Secondary | ICD-10-CM | POA: Diagnosis not present

## 2014-01-27 DIAGNOSIS — Z803 Family history of malignant neoplasm of breast: Secondary | ICD-10-CM

## 2014-01-27 DIAGNOSIS — G2581 Restless legs syndrome: Secondary | ICD-10-CM | POA: Diagnosis present

## 2014-01-27 DIAGNOSIS — T451X5A Adverse effect of antineoplastic and immunosuppressive drugs, initial encounter: Secondary | ICD-10-CM | POA: Diagnosis present

## 2014-01-27 DIAGNOSIS — Z7982 Long term (current) use of aspirin: Secondary | ICD-10-CM

## 2014-01-27 DIAGNOSIS — Z833 Family history of diabetes mellitus: Secondary | ICD-10-CM

## 2014-01-27 DIAGNOSIS — J45909 Unspecified asthma, uncomplicated: Secondary | ICD-10-CM | POA: Diagnosis present

## 2014-01-27 DIAGNOSIS — I252 Old myocardial infarction: Secondary | ICD-10-CM

## 2014-01-27 DIAGNOSIS — Z23 Encounter for immunization: Secondary | ICD-10-CM

## 2014-01-27 DIAGNOSIS — N393 Stress incontinence (female) (male): Secondary | ICD-10-CM | POA: Diagnosis present

## 2014-01-27 DIAGNOSIS — J4520 Mild intermittent asthma, uncomplicated: Secondary | ICD-10-CM

## 2014-01-27 DIAGNOSIS — R059 Cough, unspecified: Secondary | ICD-10-CM

## 2014-01-27 DIAGNOSIS — I2510 Atherosclerotic heart disease of native coronary artery without angina pectoris: Secondary | ICD-10-CM | POA: Diagnosis present

## 2014-01-27 DIAGNOSIS — I10 Essential (primary) hypertension: Secondary | ICD-10-CM

## 2014-01-27 LAB — CBC WITH DIFFERENTIAL/PLATELET
Basophils Absolute: 0.1 10*3/uL (ref 0.0–0.1)
Basophils Relative: 2 % — ABNORMAL HIGH (ref 0–1)
Eosinophils Absolute: 0.1 10*3/uL (ref 0.0–0.7)
Eosinophils Relative: 4 % (ref 0–5)
HCT: 33.7 % — ABNORMAL LOW (ref 36.0–46.0)
Hemoglobin: 10.8 g/dL — ABNORMAL LOW (ref 12.0–15.0)
Lymphocytes Relative: 28 % (ref 12–46)
Lymphs Abs: 0.9 10*3/uL (ref 0.7–4.0)
MCH: 29 pg (ref 26.0–34.0)
MCHC: 32 g/dL (ref 30.0–36.0)
MCV: 90.6 fL (ref 78.0–100.0)
Monocytes Absolute: 1.2 10*3/uL — ABNORMAL HIGH (ref 0.1–1.0)
Monocytes Relative: 41 % — ABNORMAL HIGH (ref 3–12)
Neutro Abs: 0.8 10*3/uL — ABNORMAL LOW (ref 1.7–7.7)
Neutrophils Relative %: 25 % — ABNORMAL LOW (ref 43–77)
Platelets: 133 10*3/uL — ABNORMAL LOW (ref 150–400)
RBC: 3.72 MIL/uL — ABNORMAL LOW (ref 3.87–5.11)
RDW: 18.5 % — ABNORMAL HIGH (ref 11.5–15.5)
WBC: 3.1 10*3/uL — ABNORMAL LOW (ref 4.0–10.5)

## 2014-01-27 LAB — COMPREHENSIVE METABOLIC PANEL
ALT: 69 U/L — ABNORMAL HIGH (ref 0–35)
AST: 52 U/L — ABNORMAL HIGH (ref 0–37)
Albumin: 4 g/dL (ref 3.5–5.2)
Alkaline Phosphatase: 105 U/L (ref 39–117)
Anion gap: 18 — ABNORMAL HIGH (ref 5–15)
BUN: 9 mg/dL (ref 6–23)
CO2: 20 mEq/L (ref 19–32)
Calcium: 9.4 mg/dL (ref 8.4–10.5)
Chloride: 101 mEq/L (ref 96–112)
Creatinine, Ser: 0.71 mg/dL (ref 0.50–1.10)
GFR calc Af Amer: >90 mL/min (ref >90)
GFR calc non Af Amer: 88 mL/min — ABNORMAL LOW (ref >90)
Glucose, Bld: 94 mg/dL (ref 70–99)
Potassium: 3.9 mEq/L (ref 3.7–5.3)
Sodium: 139 mEq/L (ref 137–147)
Total Bilirubin: 1.4 mg/dL — ABNORMAL HIGH (ref 0.3–1.2)
Total Protein: 8.4 g/dL — ABNORMAL HIGH (ref 6.0–8.3)

## 2014-01-27 LAB — URINALYSIS, ROUTINE W REFLEX MICROSCOPIC
Bilirubin Urine: NEGATIVE
Glucose, UA: NEGATIVE mg/dL
Hgb urine dipstick: NEGATIVE
Ketones, ur: NEGATIVE mg/dL
Leukocytes, UA: NEGATIVE
Nitrite: NEGATIVE
Protein, ur: NEGATIVE mg/dL
Specific Gravity, Urine: 1.006 (ref 1.005–1.030)
Urobilinogen, UA: 0.2 mg/dL (ref 0.0–1.0)
pH: 5.5 (ref 5.0–8.0)

## 2014-01-27 LAB — I-STAT CG4 LACTIC ACID, ED: Lactic Acid, Venous: 2.3 mmol/L — ABNORMAL HIGH (ref 0.5–2.2)

## 2014-01-27 LAB — PROTIME-INR
INR: 1.36 (ref 0.00–1.49)
Prothrombin Time: 16.9 seconds — ABNORMAL HIGH (ref 11.6–15.2)

## 2014-01-27 LAB — GLUCOSE, CAPILLARY: Glucose-Capillary: 139 mg/dL — ABNORMAL HIGH (ref 70–99)

## 2014-01-27 MED ORDER — INSULIN ASPART 100 UNIT/ML ~~LOC~~ SOLN
0.0000 [IU] | Freq: Three times a day (TID) | SUBCUTANEOUS | Status: DC
  Administered 2014-01-28: 2 [IU] via SUBCUTANEOUS
  Administered 2014-01-28 – 2014-01-29 (×2): 1 [IU] via SUBCUTANEOUS

## 2014-01-27 MED ORDER — LORAZEPAM 1 MG PO TABS
1.0000 mg | ORAL_TABLET | Freq: Four times a day (QID) | ORAL | Status: DC | PRN
  Administered 2014-01-27 – 2014-01-30 (×2): 1 mg via ORAL
  Filled 2014-01-27 (×2): qty 1

## 2014-01-27 MED ORDER — TRAMADOL HCL 50 MG PO TABS
50.0000 mg | ORAL_TABLET | Freq: Three times a day (TID) | ORAL | Status: DC | PRN
  Administered 2014-01-29: 50 mg via ORAL
  Filled 2014-01-27: qty 1

## 2014-01-27 MED ORDER — HEPARIN SODIUM (PORCINE) 5000 UNIT/ML IJ SOLN
5000.0000 [IU] | Freq: Three times a day (TID) | INTRAMUSCULAR | Status: DC
  Administered 2014-01-27 – 2014-01-30 (×8): 5000 [IU] via SUBCUTANEOUS
  Filled 2014-01-27 (×11): qty 1

## 2014-01-27 MED ORDER — MONTELUKAST SODIUM 10 MG PO TABS
10.0000 mg | ORAL_TABLET | Freq: Every day | ORAL | Status: DC
  Administered 2014-01-27 – 2014-01-29 (×3): 10 mg via ORAL
  Filled 2014-01-27 (×4): qty 1

## 2014-01-27 MED ORDER — CEFEPIME HCL 2 G IJ SOLR
2.0000 g | Freq: Three times a day (TID) | INTRAMUSCULAR | Status: DC
  Administered 2014-01-27: 2 g via INTRAVENOUS
  Filled 2014-01-27 (×2): qty 2

## 2014-01-27 MED ORDER — ACETAMINOPHEN 325 MG PO TABS
650.0000 mg | ORAL_TABLET | Freq: Once | ORAL | Status: AC
  Administered 2014-01-27: 650 mg via ORAL
  Filled 2014-01-27: qty 2

## 2014-01-27 MED ORDER — DEXTROSE 5 % IV SOLN: 2.0000 g | Freq: Three times a day (TID) | INTRAVENOUS | Status: DC

## 2014-01-27 MED ORDER — SODIUM CHLORIDE 0.9 % IV SOLN
INTRAVENOUS | Status: AC
  Administered 2014-01-27: 23:00:00 via INTRAVENOUS

## 2014-01-27 MED ORDER — DEXTROSE 5 % IV SOLN
2.0000 g | Freq: Three times a day (TID) | INTRAVENOUS | Status: DC
  Administered 2014-01-28 – 2014-01-30 (×8): 2 g via INTRAVENOUS
  Filled 2014-01-27 (×9): qty 2

## 2014-01-27 MED ORDER — SENNOSIDES-DOCUSATE SODIUM 8.6-50 MG PO TABS
1.0000 | ORAL_TABLET | Freq: Two times a day (BID) | ORAL | Status: DC | PRN
Start: 2014-01-27 — End: 2014-01-30

## 2014-01-27 MED ORDER — SODIUM CHLORIDE 0.9 % IV SOLN
1500.0000 mg | Freq: Once | INTRAVENOUS | Status: AC
  Administered 2014-01-27: 1500 mg via INTRAVENOUS
  Filled 2014-01-27: qty 1500

## 2014-01-27 MED ORDER — PROCHLORPERAZINE MALEATE 10 MG PO TABS
10.0000 mg | ORAL_TABLET | Freq: Four times a day (QID) | ORAL | Status: DC | PRN
  Filled 2014-01-27: qty 1

## 2014-01-27 MED ORDER — ASPIRIN 81 MG PO TABS: 81.0000 mg | ORAL_TABLET | Freq: Every day | ORAL | Status: DC

## 2014-01-27 MED ORDER — IBUPROFEN 800 MG PO TABS: 800.0000 mg | ORAL_TABLET | Freq: Four times a day (QID) | ORAL | Status: DC | PRN

## 2014-01-27 MED ORDER — HYDROCOD POLST-CHLORPHEN POLST 10-8 MG/5ML PO LQCR
5.0000 mL | Freq: Once | ORAL | Status: AC
  Administered 2014-01-27: 5 mL via ORAL
  Filled 2014-01-27: qty 5

## 2014-01-27 MED ORDER — ESTRADIOL 1 MG PO TABS
1.0000 mg | ORAL_TABLET | Freq: Every day | ORAL | Status: DC
  Administered 2014-01-27 – 2014-01-29 (×3): 1 mg via ORAL
  Filled 2014-01-27 (×4): qty 1

## 2014-01-27 MED ORDER — ALBUTEROL SULFATE (2.5 MG/3ML) 0.083% IN NEBU: 2.5000 mg | INHALATION_SOLUTION | RESPIRATORY_TRACT | Status: DC | PRN

## 2014-01-27 MED ORDER — ALBUTEROL SULFATE HFA 108 (90 BASE) MCG/ACT IN AERS: 2.0000 | INHALATION_SPRAY | RESPIRATORY_TRACT | Status: DC | PRN

## 2014-01-27 MED ORDER — BACLOFEN 10 MG PO TABS
10.0000 mg | ORAL_TABLET | Freq: Every day | ORAL | Status: DC | PRN
  Filled 2014-01-27: qty 1

## 2014-01-27 MED ORDER — PANTOPRAZOLE SODIUM 40 MG PO TBEC
40.0000 mg | DELAYED_RELEASE_TABLET | Freq: Every day | ORAL | Status: DC
  Administered 2014-01-28 – 2014-01-30 (×3): 40 mg via ORAL
  Filled 2014-01-27 (×3): qty 1

## 2014-01-27 MED ORDER — LORATADINE 10 MG PO TABS
10.0000 mg | ORAL_TABLET | Freq: Every day | ORAL | Status: DC
  Administered 2014-01-28 – 2014-01-30 (×3): 10 mg via ORAL
  Filled 2014-01-27 (×3): qty 1

## 2014-01-27 MED ORDER — BISOPROLOL FUMARATE 5 MG PO TABS
2.5000 mg | ORAL_TABLET | Freq: Every day | ORAL | Status: DC
  Administered 2014-01-28 – 2014-01-30 (×3): 2.5 mg via ORAL
  Filled 2014-01-27 (×3): qty 0.5

## 2014-01-27 MED ORDER — ONDANSETRON HCL 4 MG PO TABS
8.0000 mg | ORAL_TABLET | Freq: Three times a day (TID) | ORAL | Status: DC | PRN
Start: 2014-01-27 — End: 2014-01-30

## 2014-01-27 MED ORDER — ASPIRIN 81 MG PO CHEW
81.0000 mg | CHEWABLE_TABLET | Freq: Every day | ORAL | Status: DC
  Administered 2014-01-28 – 2014-01-30 (×3): 81 mg via ORAL
  Filled 2014-01-27 (×3): qty 1

## 2014-01-27 MED ORDER — SODIUM CHLORIDE 0.9 % IV BOLUS (SEPSIS)
1000.0000 mL | Freq: Once | INTRAVENOUS | Status: AC
  Administered 2014-01-27: 1000 mL via INTRAVENOUS

## 2014-01-27 MED ORDER — VANCOMYCIN HCL IN DEXTROSE 1-5 GM/200ML-% IV SOLN
1000.0000 mg | Freq: Two times a day (BID) | INTRAVENOUS | Status: DC
  Administered 2014-01-28 – 2014-01-29 (×3): 1000 mg via INTRAVENOUS
  Filled 2014-01-27 (×4): qty 200

## 2014-01-27 MED ORDER — SENNA-DOCUSATE SODIUM 8.6-50 MG PO TABS: 1.0000 | ORAL_TABLET | Freq: Two times a day (BID) | ORAL | Status: DC | PRN

## 2014-01-27 NOTE — Progress Notes (Signed)
ANTIBIOTIC CONSULT NOTE - INITIAL  Pharmacy Consult for Vancomycin  Indication: Febrile Neutropenia  Allergies  Allergen Reactions  . Shellfish Allergy     HER THROAT SWELLS UP AND CLOSES  . Penicillins Rash    WHEN SHE WAS TEENAGER    Patient Measurements:   Adjusted Body Weight:  Vital Signs: Temp: 100.6 F (38.1 C) (12/17 1729) Temp Source: Oral (12/17 1729) BP: 156/83 mmHg (12/17 1720) Pulse Rate: 113 (12/17 1720) Intake/Output from previous day:   Intake/Output from this shift:    Labs: No results for input(s): WBC, HGB, PLT, LABCREA, CREATININE in the last 72 hours. Estimated Creatinine Clearance: 66.3 mL/min (by C-G formula based on Cr of 0.8). No results for input(s): VANCOTROUGH, VANCOPEAK, VANCORANDOM, GENTTROUGH, GENTPEAK, GENTRANDOM, TOBRATROUGH, TOBRAPEAK, TOBRARND, AMIKACINPEAK, AMIKACINTROU, AMIKACIN in the last 72 hours.   Microbiology: No results found for this or any previous visit (from the past 720 hour(s)).  Medical History: Past Medical History  Diagnosis Date  . Diabetes mellitus   . Asthma   . Overactive bladder   . Non-ST elevated myocardial infarction (non-STEMI) Sept 2012    Mild elevation in troponin and CK MB but no evidence of coronary disease at time of cath  . Elevated LFTs   . Obesity   . Hypertension   . Cirrhosis   . Portal hypertension   . Glaucoma, narrow-angle   . Ovarian cancer 11/22/2013    Disseminated ovarian cancer    Assessment: 61 yoF with hx ovarian cancer s/p chemotherapy, also PMHx DM, HTN, portal HTN, obesity, and cirrhosis, presents with cough, chills, and fever.   Pharmacy consulted to start Vancomycin for sepsis and possible febrile neutropenia.   12/17 >> Cefepime  >> 12/17 >> Vancomycin  >>    Tmax:  100.6  WBCs:1.5K, ANC 0.6 on 12/14: CBC with Diff in process  Renal: SCr 0.8 on 12/14 - CrCl 66 ml/min (N78) - CMET in process LA (12/17): 2.30   12/17 blood: collected  Goal of Therapy:  Vancomycin  trough level 15-20 mcg/ml  Eradication of infection  Plan:  Vancomycin 1537m IV x1 now, then start vancomycin 1g IV q12h F/u renal function, clinical course  CRalene Bathe PharmD, BCPS 01/27/2014, 6:13 PM  Pager: [phone]

## 2014-01-27 NOTE — H&P (Signed)
[facility] History and Physical  WILLER OSORNO TTS:[id number] DOB: 07-03-1945 DOA: 01/27/2014  Referring physician: ED physician PCP: Helane Rima, MD  Specialists:   Chief Complaint: Cough and fever  HPI: Olivia Benson is a 68 y.o. 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  Patient reports that she started having fever and cough yesterday. She had temperature of 101.2. Her cough is dry cough, no shortness of breath or chest pain. Patient reports no nasal congestion, slightly scratchy throat, no pain in her ears, no wheezing. Patient does not have symptoms for UTI. No nausea, vomiting, abdominal pain or diarrhea. She took ibuprofen with some relief. This morning she was feeling well, went to work, but developed chills again. She called her oncologist and was told to come here. Of note, patient is currently on chemotherapy, last treatment was 9 days ago. She has had 3 treatments so far. Neulasta treatment 3 days ago.   Work up in the ED demonstrates neutropenia with WBC 3.1, negative urinalysis, negative chest x-ray for acute abnormalities, lactate 2.30. Patient is admitted to inpatient for further evaluation and treatment.  Review of Systems: As presented in the history of presenting illness, rest negative.  Where does patient live? At home  Can patient participate in ADLs? Yes  Allergy:  Allergies  Allergen Reactions  . Shellfish Allergy     HER THROAT SWELLS UP AND CLOSES  . Penicillins Rash    WHEN SHE WAS TEENAGER    Past Medical History  Diagnosis Date  . Diabetes mellitus   . Asthma   . Overactive bladder   . Non-ST elevated myocardial infarction (non-STEMI) Sept 2012    Mild elevation in troponin and CK MB but no evidence of coronary disease at time of cath  . Elevated LFTs   . Obesity   . Hypertension   . Cirrhosis   . Portal  hypertension   . Glaucoma, narrow-angle   . Ovarian cancer 11/22/2013    Disseminated ovarian cancer    Past Surgical History  Procedure Laterality Date  . Tonsillectomy  1971  . Abdominal ultrasound  Sept 2012    No gallbladder disease, + fatty liver  . Cardiac catheterization  10/25/2010    EF 60%; Normal coronaries  . Colonoscopy  10/18/2005  . Wh-mammography  11/24/2009  . Partial hysterectomy  1988  . Cosmetic surgery  2003    Social History:  reports that she quit smoking about 31 years ago. Her smoking use included Cigarettes. She has a 1 pack-year smoking history. She uses smokeless tobacco. She reports that she drinks alcohol. She reports that she does not use illicit drugs.  Family History:  Family History  Problem Relation Age of Onset  . Asthma Mother   . Diabetes Mother   . Hypertension Mother   . Allergies Mother   . Rheum arthritis Mother   . Bladder Cancer Father   . Hypertension Father   . Stroke Father   . Breast cancer Sister   . Breast cancer Sister 66  . Diabetes Brother   . Diabetes Sister     x 3  . Allergies Brother   . Allergies Sister   . Asthma Brother   . Asthma Sister     x 2   . Breast cancer Maternal Aunt   . Breast cancer Cousin     (maternal)  Prior to Admission medications   Medication Sig Start Date End Date Taking? Authorizing Provider  aspirin 81 MG tablet Take 81 mg by mouth daily.     Yes Historical Provider, MD  baclofen (LIORESAL) 10 MG tablet Take 10 mg by mouth daily as needed for muscle spasms (muscle spasms).  11/30/13 11/30/14 Yes Historical Provider, MD  bisoprolol (ZEBETA) 5 MG tablet Take 1/2 tablet daily 01/13/14  Yes Tanda Rockers, MD  dexamethasone (DECADRON) 4 MG tablet Take 5 tabs with food(=13m) 12 hrs6 hrs prior to Taxol chemotherapy 01/13/14  Yes Lennis PMarion Downer MD  docusate sodium (COLACE) 100 MG capsule Take 100 mg by mouth daily as needed for mild constipation (constipation).    Yes Historical Provider,  MD  estradiol (ESTRACE) 1 MG tablet Take 1 mg by mouth at bedtime.    Yes Historical Provider, MD  famotidine (PEPCID) 20 MG tablet Take 20 mg by mouth at bedtime as needed for heartburn (cough).    Yes Historical Provider, MD  ibuprofen (ADVIL,MOTRIN) 200 MG tablet Take 800 mg by mouth every 6 (six) hours as needed for moderate pain (pain).   Yes Historical Provider, MD  loratadine (CLARITIN) 10 MG tablet Take 10 mg by mouth daily.    Yes Historical Provider, MD  LORazepam (ATIVAN) 1 MG tablet Place 1 tablet under the tongue or swallow every 6 hrs as needed for nausea or as directed 01/17/14  Yes Lennis P Livesay, MD  metFORMIN (GLUCOPHAGE-XR) 500 MG 24 hr tablet Take 500 mg by mouth daily. 11/26/13  Yes Historical Provider, MD  MICROLET LANCETS MISC Test 1  times daily. Dx 250.02 10/07/13  Yes Historical Provider, MD  montelukast (SINGULAIR) 10 MG tablet Take 10 mg by mouth at bedtime.    Yes Historical Provider, MD  pantoprazole (PROTONIX) 40 MG tablet Take 40 mg by mouth daily.     Yes Historical Provider, MD  traMADol (ULTRAM) 50 MG tablet Take 1 tablet (50 mg total) by mouth every 8 (eight) hours as needed for moderate pain. 01/13/14  Yes Lennis PMarion Downer MD  albuterol (PROVENTIL HFA;VENTOLIN HFA) 108 (90 BASE) MCG/ACT inhaler Inhale 2 puffs into the lungs every 4 (four) hours as needed.      Historical Provider, MD  mometasone-formoterol (DULERA) 100-5 MCG/ACT AERO Inhale 2 puffs into the lungs as needed for wheezing or shortness of breath.  10/05/13   MTanda Rockers MD  ondansetron (ZOFRAN) 8 MG tablet Take 1 tablet (8 mg total) by mouth every 8 (eight) hours as needed for nausea or vomiting. 12/01/13   Lennis PMarion Downer MD  prochlorperazine (COMPAZINE) 10 MG tablet Take 1 tablet (10 mg total) by mouth every 6 (six) hours as needed for nausea or vomiting. 12/06/13   Lennis PMarion Downer MD  sennosides-docusate sodium (SENOKOT-S) 8.6-50 MG tablet Take 1 tablet by mouth 2 (two) times daily as needed  for constipation (constipation).     Historical Provider, MD    Physical Exam: Filed Vitals:   01/27/14 1720 01/27/14 1729 01/27/14 2037  BP: 156/83  135/56  Pulse: 113  94  Temp: 99.7 F (37.6 C) 100.6 F (38.1 C) 98.7 F (37.1 C)  TempSrc: Oral Oral Oral  Resp: 20  18  SpO2: 98%  95%   General: Not in acute distress HEENT:       Eyes: PERRL, EOMI, no scleral icterus       ENT: No discharge from the ears and nose, no pharynx injection, no tonsillar  enlargement.        Neck: No JVD, no bruit, no mass felt. Cardiac: S1/S2, RRR, No murmurs, No gallops or rubs Pulm: Good air movement bilaterally. Clear to auscultation bilaterally. No rales, wheezing, rhonchi or rubs. Abd: Soft, nondistended, nontender, no rebound pain, no organomegaly, BS present Ext: No edema bilaterally. 2+DP/PT pulse bilaterally Musculoskeletal: No joint deformities, erythema, or stiffness, ROM full Skin: No rashes.  Neuro: Alert and oriented X3, cranial nerves II-XII grossly intact, muscle strength 5/5 in all extremeties, sensation to light touch intact.  Psych: Patient is not psychotic, no suicidal or hemocidal ideation.  Labs on Admission:  Basic Metabolic Panel:  Recent Labs Lab 01/24/14 1054 01/27/14 1745  NA 138 139  K 4.1 3.9  CL  --  101  CO2 23 20  GLUCOSE 140 94  BUN 13.3 9  CREATININE 0.8 0.71  CALCIUM 8.8 9.4   Liver Function Tests:  Recent Labs Lab 01/24/14 1054 01/27/14 1745  AST 57* 52*  ALT 81* 69*  ALKPHOS 129 105  BILITOT 0.53 1.4*  PROT 7.9 8.4*  ALBUMIN 3.7 4.0   No results for input(s): LIPASE, AMYLASE in the last 168 hours. No results for input(s): AMMONIA in the last 168 hours. CBC:  Recent Labs Lab 01/24/14 1054 01/27/14 1745  WBC 1.5* 3.1*  NEUTROABS 0.6* 0.8*  HGB 10.9* 10.8*  HCT 34.2* 33.7*  MCV 90.5 90.6  PLT 111* 133*   Cardiac Enzymes: No results for input(s): CKTOTAL, CKMB, CKMBINDEX, TROPONINI in the last 168 hours.  BNP (last 3 results) No  results for input(s): PROBNP in the last 8760 hours. CBG: No results for input(s): GLUCAP in the last 168 hours.  Radiological Exams on Admission: Dg Chest 2 View  01/27/2014   CLINICAL DATA:  Fever, cough.  EXAM: CHEST  2 VIEW  COMPARISON:  October 24, 2010.  FINDINGS: The heart size and mediastinal contours are within normal limits. No pneumothorax or pleural effusion is noted. Stable linear scarring is noted in the lingular and right middle lobe regions. No acute pulmonary disease is noted. The visualized skeletal structures are unremarkable.  IMPRESSION: No acute cardiopulmonary abnormality seen.   Electronically Signed   By: Sabino Dick M.D.   On: 01/27/2014 19:32    EKG: not done yet, will get one  Assessment/Plan Principal Problem:   Fever and neutropenia Active Problems:   Essential hypertension   Disseminated ovarian cancer   Liver cirrhosis secondary to NASH   Pancytopenia due to chemotherapy   Asthma  Fever, neutropenia and sepsis: etiology is not clear. Patient has cough, but chest x-ray is negative for infiltration, but this dose not rule out early stage of PNA. Since patient has mild neutropenia with WBC 3.1 secondary to chemotherapy, she needs to be covered with broad antibiotics. Patient is mildly septic, but hemodynamically stable. Lactate is slightly elevated at 2.30. Tachycardia with heart rate 130 on admission. - will admit to tele bed.  -started vancomycin and cefepime in Ed (pateint was allergic to penicillin at age of 64, possibly rash. It should be ok to [place] cefepime) - blood culture x2 and urine culutre - urine legionella and S. pneumococcal antigen - follow up sputum culture and her respiratory events panel - IVF: 125 cc/h  Asthma: Lung auscultation is clear, no signs of acute exacerbation. Patient is on albuterol inhaler and Singulair at home. -We continue home medications.  HTN: -continue bisoprolol  Diabetes mellitus: On metformin at home. No A1c on  record. -  Switched to sliding scale insulin -Check A1c  CAD and hx of NSTEMI 2012:  had clean cardiac cath per patient. No chest pain -Continue aspirin -Continue bisoprolol -check EKG  High grade IIIC ovarian cancer: being treated by Dr. Marko Plume. Patient is currently on chemotherapy, last treatment was 9 days ago. She has had 3 treatments so far. Neulasta treatment 3 days ago.  -follow up with Dr. Marko Plume after discharge  Pancytopenia secondary to chemotherapy: Hemoglobin stable -Monitor CBC  Cirrhosis secondary to NASH: AST and ALT are stable. Total bilirubin slightly elevated to 1.4 which is likely due to sepsis. Patient does not have any signs of SBP. -avoid tylenol -repeat CMP  DVT ppx: SQ Heparin     Code Status: Full code Family Communication: Yes, patient's  husband     at bed side Disposition Plan: Admit to inpatient   Date of Service 01/27/2014    Ivor Costa [facility] Pager [phone]  If 7PM-7AM, please contact night-coverage [email or web address] Password [facility] 01/27/2014, 8:43 PM

## 2014-01-27 NOTE — ED Provider Notes (Signed)
CSN: [id number]     Arrival date & time 01/27/14  1712 History   First MD Initiated Contact with Patient 01/27/14 1718     Chief Complaint  Patient presents with  . Cancer  . Fever     (Consider location/radiation/quality/duration/timing/severity/associated sxs/prior Treatment) HPI BRANDELYN HENNE is a 68 y.o. female history of diabetes, asthma, MI, cirrhosis, hypertension, and most recent diagnosis of high grade serous gyn carcinoma, clinically IIIC ovarian primary, presents to emergency department complaining of cough and fever. Patient is currently on chemotherapy, last treatment was 9 days ago. States she has had 3 treatments so far. Neulasta treatment 3 days ago. States cough started 2-3 days ago. It is dry. Non productive. Also reports noticed she had chills yesterday, took temp at home, it was 100.4. Took ibuprofen with good relief. States this morning was feeling well, went to work. States there developed chills again. When she came home she took her temperature and it was 101.2. She did not take any medications, but called her oncologist and she was told to come here. Patient reports no nasal congestion, slightly scratchy throat, no pain in her ears, no nausea, vomiting, diarrhea. No pain in her chest or abdomen. No urinary symptoms. Patient was noted to be mildly neutropenic with white blood cell count of 1.53 days ago.   Past Medical History  Diagnosis Date  . Diabetes mellitus   . Asthma   . Overactive bladder   . Non-ST elevated myocardial infarction (non-STEMI) Sept 2012    Mild elevation in troponin and CK MB but no evidence of coronary disease at time of cath  . Elevated LFTs   . Obesity   . Hypertension   . Cirrhosis   . Portal hypertension   . Glaucoma, narrow-angle   . Ovarian cancer 11/22/2013    Disseminated ovarian cancer   Past Surgical History  Procedure Laterality Date  . Tonsillectomy  1971  . Abdominal ultrasound  Sept 2012    No gallbladder disease, +  fatty liver  . Cardiac catheterization  10/25/2010    EF 60%; Normal coronaries  . Colonoscopy  10/18/2005  . Wh-mammography  11/24/2009  . Partial hysterectomy  1988  . Cosmetic surgery  2003   Family History  Problem Relation Age of Onset  . Asthma Mother   . Diabetes Mother   . Hypertension Mother   . Allergies Mother   . Rheum arthritis Mother   . Bladder Cancer Father   . Hypertension Father   . Stroke Father   . Breast cancer Sister   . Breast cancer Sister 27  . Diabetes Brother   . Diabetes Sister     x 3  . Allergies Brother   . Allergies Sister   . Asthma Brother   . Asthma Sister     x 2   . Breast cancer Maternal Aunt   . Breast cancer Cousin     (maternal)   History  Substance Use Topics  . Smoking status: Former Smoker -- 1.00 packs/day for 1 years    Types: Cigarettes    Quit date: 02/11/1982  . Smokeless tobacco: Current User  . Alcohol Use: Yes     Comment: socially   OB History    No data available     Review of Systems  Constitutional: Positive for fever, chills and fatigue.  HENT: Positive for sore throat. Negative for dental problem.   Respiratory: Positive for cough. Negative for chest tightness and shortness of breath.  Cardiovascular: Negative for chest pain, palpitations and leg swelling.  Gastrointestinal: Negative for nausea, vomiting, abdominal pain and diarrhea.  Genitourinary: Negative for dysuria, flank pain and pelvic pain.  Musculoskeletal: Negative for myalgias, arthralgias, neck pain and neck stiffness.  Skin: Negative for rash.  Neurological: Positive for weakness and headaches. Negative for dizziness.  All other systems reviewed and are negative.     Allergies  Shellfish allergy and Penicillins  Home Medications   Prior to Admission medications   Medication Sig Start Date End Date Taking? Authorizing Provider  albuterol (PROVENTIL HFA;VENTOLIN HFA) 108 (90 BASE) MCG/ACT inhaler Inhale 2 puffs into the lungs every 4  (four) hours as needed.      Historical Provider, MD  aspirin 81 MG tablet Take 81 mg by mouth daily.      Historical Provider, MD  baclofen (LIORESAL) 10 MG tablet Take 10 mg by mouth at bedtime. 11/30/13 11/30/14  Historical Provider, MD  bisoprolol (ZEBETA) 5 MG tablet Take 1/2 tablet daily Patient not taking: Reported on 01/24/2014 01/13/14   Tanda Rockers, MD  dexamethasone (DECADRON) 4 MG tablet Take 5 tabs with food(=30m) 12 hrs6 hrs prior to Taxol chemotherapy 01/13/14   Lennis PMarion Downer MD  docusate sodium (COLACE) 100 MG capsule Take 100 mg by mouth daily.    Historical Provider, MD  estradiol (ESTRACE) 1 MG tablet Take 1 mg by mouth daily.    Historical Provider, MD  famotidine (PEPCID) 20 MG tablet Take 20 mg by mouth at bedtime.    Historical Provider, MD  loratadine (CLARITIN) 10 MG tablet Take 10 mg by mouth daily as needed for allergies.    Historical Provider, MD  LORazepam (ATIVAN) 1 MG tablet Place 1 tablet under the tongue or swallow every 6 hrs as needed for nausea or as directed 01/17/14   Lennis P LMarko Plume MD  metFORMIN (GLUCOPHAGE-XR) 500 MG 24 hr tablet Take 500 mg by mouth daily. 11/26/13   Historical Provider, MD  MICROLET LANCETS MISC Test 1  times daily. Dx 250.02 10/07/13   Historical Provider, MD  mometasone-formoterol (DULERA) 100-5 MCG/ACT AERO as needed. Inhale one puff each morning plus one extra dose at bedtime if coughing 10/05/13   MTanda Rockers MD  montelukast (SINGULAIR) 10 MG tablet Take 10 mg by mouth at bedtime.     Historical Provider, MD  ondansetron (ZOFRAN) 8 MG tablet Take 1 tablet (8 mg total) by mouth every 8 (eight) hours as needed for nausea or vomiting. Patient not taking: Reported on 01/24/2014 12/01/13   LGordy Levan MD  pantoprazole (PROTONIX) 40 MG tablet Take 40 mg by mouth daily.      Historical Provider, MD  prochlorperazine (COMPAZINE) 10 MG tablet Take 1 tablet (10 mg total) by mouth every 6 (six) hours as needed for nausea or  vomiting. Patient not taking: Reported on 01/03/2014 12/06/13   LGordy Levan MD  sennosides-docusate sodium (SENOKOT-S) 8.6-50 MG tablet Take 1-2 tablets by mouth 2 (two) times daily as needed for constipation.    Historical Provider, MD  traMADol (ULTRAM) 50 MG tablet Take 1 tablet (50 mg total) by mouth every 8 (eight) hours as needed for moderate pain. 01/13/14   Lennis P Livesay, MD   BP 156/83 mmHg  Pulse 113  Temp(Src) 100.6 F (38.1 C) (Oral)  Resp 20  SpO2 98% Physical Exam  Constitutional: She is oriented to person, place, and time. She appears well-developed and well-nourished. No distress.  HENT:  Head: Normocephalic.  Eyes: Conjunctivae are normal.  Neck: Neck supple.  Cardiovascular: Normal rate, regular rhythm and normal heart sounds.   Pulmonary/Chest: Effort normal and breath sounds normal. No respiratory distress. She has no wheezes. She has no rales.  Abdominal: Soft. Bowel sounds are normal. She exhibits no distension. There is no tenderness. There is no rebound.  Musculoskeletal: She exhibits no edema.  Neurological: She is alert and oriented to person, place, and time. No cranial nerve deficit.  Skin: Skin is warm and dry.  Psychiatric: She has a normal mood and affect. Her behavior is normal.  Nursing note and vitals reviewed.   ED Course  Procedures (including critical care time) Labs Review Labs Reviewed  CBC WITH DIFFERENTIAL - Abnormal; Notable for the following:    WBC 3.1 (*)    RBC 3.72 (*)    Hemoglobin 10.8 (*)    HCT 33.7 (*)    RDW 18.5 (*)    Platelets 133 (*)    Neutrophils Relative % 25 (*)    Monocytes Relative 41 (*)    Basophils Relative 2 (*)    Neutro Abs 0.8 (*)    Monocytes Absolute 1.2 (*)    All other components within normal limits  COMPREHENSIVE METABOLIC PANEL - Abnormal; Notable for the following:    Total Protein 8.4 (*)    AST 52 (*)    ALT 69 (*)    Total Bilirubin 1.4 (*)    GFR calc non Af Amer 88 (*)    Anion  gap 18 (*)    All other components within normal limits  PROTIME-INR - Abnormal; Notable for the following:    Prothrombin Time 16.9 (*)    All other components within normal limits  GLUCOSE, CAPILLARY - Abnormal; Notable for the following:    Glucose-Capillary 139 (*)    All other components within normal limits  I-STAT CG4 LACTIC ACID, ED - Abnormal; Notable for the following:    Lactic Acid, Venous 2.30 (*)    All other components within normal limits  CULTURE, BLOOD (ROUTINE X 2)  CULTURE, BLOOD (ROUTINE X 2)  URINE CULTURE  URINE CULTURE  CULTURE, EXPECTORATED SPUTUM-ASSESSMENT  GRAM STAIN  URINALYSIS, ROUTINE W REFLEX MICROSCOPIC  INFLUENZA PANEL BY PCR (TYPE A & B, H1N1)  PATHOLOGIST SMEAR REVIEW  HIV ANTIBODY (ROUTINE TESTING)  LEGIONELLA ANTIGEN, URINE  STREP PNEUMONIAE URINARY ANTIGEN  COMPREHENSIVE METABOLIC PANEL  CBC WITH DIFFERENTIAL  HEMOGLOBIN A1C    Imaging Review Dg Chest 2 View  01/27/2014   CLINICAL DATA:  Fever, cough.  EXAM: CHEST  2 VIEW  COMPARISON:  October 24, 2010.  FINDINGS: The heart size and mediastinal contours are within normal limits. No pneumothorax or pleural effusion is noted. Stable linear scarring is noted in the lingular and right middle lobe regions. No acute pulmonary disease is noted. The visualized skeletal structures are unremarkable.  IMPRESSION: No acute cardiopulmonary abnormality seen.   Electronically Signed   By: Sabino Dick M.D.   On: 01/27/2014 19:32     EKG Interpretation None      MDM   Final diagnoses:  Neutropenic fever  Cough variant asthma vs UACS    Patient with history of ovarian cancer with metastases, currently on chemotherapy, presents with a fever and cough. Possible neutropenia, last white blood cell count was 1.53 days ago. Patient's tachycardic, febrile at 100.6. We'll start septic workup, including labs, blood cultures, will start IV antibiotics.   Pt is neutropenic. CXR, UA negative. Lactic  acid  slightly up. Temp down with tylenol. HR down with IV fluids and tylenol. PT's cultures are pending. Influenza PCR obtained. Pt [facility] further treatment.   Filed Vitals:   01/27/14 1720 01/27/14 1729 01/27/14 2037 01/27/14 2206  BP: 156/83  135/56 134/60  Pulse: 113  94 92  Temp: 99.7 F (37.6 C) 100.6 F (38.1 C) 98.7 F (37.1 C) 98.1 F (36.7 C)  TempSrc: Oral Oral Oral Oral  Resp: 20  18 18   Height:    5' 1.5" (1.562 m)  Weight:    175 lb (79.379 kg)  SpO2: 98%  95% 95%      Renold Genta, PA-C 01/28/14 0101  Quintella Reichert, MD 01/31/14 1458

## 2014-01-27 NOTE — ED Notes (Signed)
Below orders not completed by EW.

## 2014-01-27 NOTE — Telephone Encounter (Signed)
Received call from pt stating that she ran temp 104 last hs but temp broke & she had no fever this am.  She now reports a fever of 101.2 & chills & aches & a light cough.  She reports taking neulasta while here on 01/24/14.  Will discuss with Dr Alen Blew. Pt notified to go to [facility] ED.  Triage RN notified that pt will be coming & given update on pt.

## 2014-01-27 NOTE — ED Notes (Signed)
Pt reports she had a temperature of 100.19F at home yesterday, felt better overnight. Checked temperature at home and it was 101.67F at home, has not taken tylenol/ibuprofen. Pt started to develop cough yesterday. Last chemo Tuesday.

## 2014-01-28 ENCOUNTER — Inpatient Hospital Stay (HOSPITAL_COMMUNITY): Payer: Medicare Other

## 2014-01-28 ENCOUNTER — Telehealth: Payer: Self-pay | Admitting: *Deleted

## 2014-01-28 DIAGNOSIS — D701 Agranulocytosis secondary to cancer chemotherapy: Secondary | ICD-10-CM

## 2014-01-28 DIAGNOSIS — C569 Malignant neoplasm of unspecified ovary: Secondary | ICD-10-CM

## 2014-01-28 DIAGNOSIS — G2581 Restless legs syndrome: Secondary | ICD-10-CM

## 2014-01-28 DIAGNOSIS — E119 Type 2 diabetes mellitus without complications: Secondary | ICD-10-CM

## 2014-01-28 DIAGNOSIS — R5081 Fever presenting with conditions classified elsewhere: Secondary | ICD-10-CM

## 2014-01-28 LAB — HEMOGLOBIN A1C
Hgb A1c MFr Bld: 6.3 % — ABNORMAL HIGH (ref <5.7)
Mean Plasma Glucose: 134 mg/dL — ABNORMAL HIGH (ref <117)

## 2014-01-28 LAB — COMPREHENSIVE METABOLIC PANEL
ALT: 47 U/L — ABNORMAL HIGH (ref 0–35)
AST: 37 U/L (ref 0–37)
Albumin: 3 g/dL — ABNORMAL LOW (ref 3.5–5.2)
Alkaline Phosphatase: 86 U/L (ref 39–117)
Anion gap: 13 (ref 5–15)
BUN: 8 mg/dL (ref 6–23)
CO2: 20 mEq/L (ref 19–32)
Calcium: 8.2 mg/dL — ABNORMAL LOW (ref 8.4–10.5)
Chloride: 104 mEq/L (ref 96–112)
Creatinine, Ser: 0.74 mg/dL (ref 0.50–1.10)
GFR calc Af Amer: >90 mL/min (ref >90)
GFR calc non Af Amer: 87 mL/min — ABNORMAL LOW (ref >90)
Glucose, Bld: 106 mg/dL — ABNORMAL HIGH (ref 70–99)
Potassium: 3.4 mEq/L — ABNORMAL LOW (ref 3.7–5.3)
Sodium: 137 mEq/L (ref 137–147)
Total Bilirubin: 0.9 mg/dL (ref 0.3–1.2)
Total Protein: 6.3 g/dL (ref 6.0–8.3)

## 2014-01-28 LAB — INFLUENZA PANEL BY PCR (TYPE A & B)
H1N1 flu by pcr: NOT DETECTED
Influenza A By PCR: NEGATIVE
Influenza B By PCR: NEGATIVE

## 2014-01-28 LAB — CBC WITH DIFFERENTIAL/PLATELET
Basophils Absolute: 0 10*3/uL (ref 0.0–0.1)
Basophils Relative: 1 % (ref 0–1)
Eosinophils Absolute: 0.1 10*3/uL (ref 0.0–0.7)
Eosinophils Relative: 2 % (ref 0–5)
HCT: 26.9 % — ABNORMAL LOW (ref 36.0–46.0)
Hemoglobin: 8.7 g/dL — ABNORMAL LOW (ref 12.0–15.0)
Lymphocytes Relative: 20 % (ref 12–46)
Lymphs Abs: 0.9 10*3/uL (ref 0.7–4.0)
MCH: 29.2 pg (ref 26.0–34.0)
MCHC: 32.3 g/dL (ref 30.0–36.0)
MCV: 90.3 fL (ref 78.0–100.0)
Monocytes Absolute: 1.7 10*3/uL — ABNORMAL HIGH (ref 0.1–1.0)
Monocytes Relative: 37 % — ABNORMAL HIGH (ref 3–12)
Neutro Abs: 1.9 10*3/uL (ref 1.7–7.7)
Neutrophils Relative %: 40 % — ABNORMAL LOW (ref 43–77)
Platelets: 103 10*3/uL — ABNORMAL LOW (ref 150–400)
RBC: 2.98 MIL/uL — ABNORMAL LOW (ref 3.87–5.11)
RDW: 19.1 % — ABNORMAL HIGH (ref 11.5–15.5)
WBC: 4.6 10*3/uL (ref 4.0–10.5)
nRBC: 3 /100 WBC — ABNORMAL HIGH

## 2014-01-28 LAB — GLUCOSE, CAPILLARY
Glucose-Capillary: 159 mg/dL — ABNORMAL HIGH (ref 70–99)
Glucose-Capillary: 136 mg/dL — ABNORMAL HIGH (ref 70–99)
Glucose-Capillary: 119 mg/dL — ABNORMAL HIGH (ref 70–99)
Glucose-Capillary: 116 mg/dL — ABNORMAL HIGH (ref 70–99)

## 2014-01-28 LAB — LEGIONELLA ANTIGEN, URINE

## 2014-01-28 LAB — HIV ANTIBODY (ROUTINE TESTING W REFLEX): HIV 1&2 Ab, 4th Generation: NONREACTIVE

## 2014-01-28 LAB — PATHOLOGIST SMEAR REVIEW

## 2014-01-28 LAB — STREP PNEUMONIAE URINARY ANTIGEN: Strep Pneumo Urinary Antigen: NEGATIVE

## 2014-01-28 MED ORDER — CETYLPYRIDINIUM CHLORIDE 0.05 % MT LIQD
7.0000 mL | Freq: Two times a day (BID) | OROMUCOSAL | Status: DC
  Administered 2014-01-28 – 2014-01-29 (×3): 7 mL via OROMUCOSAL

## 2014-01-28 MED ORDER — IBUPROFEN 800 MG PO TABS: 800.0000 mg | ORAL_TABLET | Freq: Four times a day (QID) | ORAL | Status: DC | PRN

## 2014-01-28 MED ORDER — SODIUM CHLORIDE 0.9 % IV SOLN
INTRAVENOUS | Status: DC
  Administered 2014-01-28 – 2014-01-29 (×3): via INTRAVENOUS
  Filled 2014-01-28 (×5): qty 1000

## 2014-01-28 MED ORDER — TRAMADOL HCL 50 MG PO TABS
50.0000 mg | ORAL_TABLET | Freq: Once | ORAL | Status: AC
  Administered 2014-01-28: 50 mg via ORAL
  Filled 2014-01-28: qty 1

## 2014-01-28 MED ORDER — LORAZEPAM 1 MG PO TABS
1.0000 mg | ORAL_TABLET | Freq: Every day | ORAL | Status: DC
  Administered 2014-01-28 – 2014-01-29 (×2): 1 mg via ORAL
  Filled 2014-01-28 (×2): qty 1

## 2014-01-28 MED ORDER — ACETAMINOPHEN 325 MG PO TABS: 650.0000 mg | ORAL_TABLET | Freq: Four times a day (QID) | ORAL | Status: DC | PRN

## 2014-01-28 NOTE — Progress Notes (Signed)
Spoke with [facility] with Infection control regarding taking patient off droplet since influenza pcr is negative. Melissa said it's ok to take pt off. Pt made aware and taken off. Vwilliams,rn.

## 2014-01-28 NOTE — Plan of Care (Signed)
Problem: Consults Goal: Diabetes Guidelines if Diabetic/Glucose > 140 If diabetic or lab glucose is > 140 mg/dl - Initiate Diabetes/Hyperglycemia Guidelines & Document Interventions  Outcome: Completed/Met Date Met:  01/28/14 CBG achs with SSI coverage ac     Problem: Phase I Progression Outcomes Goal: OOB as tolerated unless otherwise ordered Outcome: Completed/Met Date Met:  01/28/14 OOB with supervision Goal: Initial discharge plan identified Outcome: Completed/Met Date Met:  01/28/14 Home with husband

## 2014-01-28 NOTE — Progress Notes (Signed)
CARE MANAGEMENT NOTE 01/28/2014  Patient:  Olivia Benson, Olivia Benson   Account Number:  [id number]  Date Initiated:  01/28/2014  Documentation initiated by:  Olga Coaster  Subjective/Objective Assessment:   ADMITTED WITH SEPSIS     Action/Plan:   CM FOLLOWING FOR DCP   Anticipated DC Date:  02/04/2014   Anticipated DC Plan:  [place]  CM consult        Status of service:  In process, will continue to follow Medicare Important Message given?   (If response is "NO", the following Medicare IM given date fields will be blank)  Per UR Regulation:  Reviewed for med. necessity/level of care/duration of stay  Comments:  12/18/2015Mindi Slicker RN,BSN,MHA [phone]

## 2014-01-28 NOTE — Progress Notes (Addendum)
[facility] PROGRESS NOTE  TARIYAH PENDRY WGY:[id number] DOB: 25-Oct-1945 DOA: 01/27/2014 PCP: Helane Rima, MD  Assessment/Plan: Principal Problem:   Fever and neutropenia Active Problems:   Essential hypertension   Disseminated ovarian cancer   Liver cirrhosis secondary to NASH   Pancytopenia due to chemotherapy   Asthma    Fever, neutropenia and sepsis: etiology is not clear. Patient has cough, but chest x-ray is negative for infiltration, but this dose not rule out early stage of PNA. Slight improvement in the patient's white blood cell count. 4.6 with 40% neutrophils. Continue to be covered with broad antibiotics. Patient is mildly septic, but hemodynamically stable. Lactate is slightly elevated at 2.30. Tachycardia with heart rate 130 on admission. Continue telemetry -Follow- blood culture x2 and urine culutre - urine legionella and S. pneumococcal antigen Influenza PCR negative, continue neutropenic precautions Patient received Neulasta on 12/14 - IVF: 125 cc/h  Asthma: Lung auscultation is clear, no signs of acute exacerbation. Patient is on albuterol inhaler and Singulair at home. -We continue home medications.  HTN: -continue bisoprolol  Diabetes mellitus: On metformin at home. No A1c on record. -Switched to sliding scale insulin -Check A1c  CAD and hx of NSTEMI 2012: had clean cardiac cath per patient. No chest pain -Continue aspirin -Continue bisoprolol -check EKG  High grade IIIC ovarian cancer: being treated by Dr. Marko Plume. Patient is currently on chemotherapy, last treatment was 9 days ago. She has had 3 treatments so far. Neulasta treatment 3 days ago.  As per patient request have notified Dr. Marko Plume . Next chemotherapy 12/29   Pancytopenia secondary to chemotherapy: Hemoglobin stable -Monitor CBC with differential  Cirrhosis secondary to NASH: AST and ALT are stable. Total bilirubin slightly elevated to 1.4 which is likely due to sepsis.  Patient does not have any signs of SBP. -avoid tylenol -repeat CMP  Code Status: full Family Communication: family updated about patient's clinical progress Disposition Plan: Anticipate discharge tomorrow culture remains negative     Brief narrative: MALKIE WILLE is a 68 y.o. 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  Patient reports that she started having fever and cough yesterday. She had temperature of 101.2. Her cough is dry cough, no shortness of breath or chest pain. Patient reports no nasal congestion, slightly scratchy throat, no pain in her ears, no wheezing. Patient does not have symptoms for UTI. No nausea, vomiting, abdominal pain or diarrhea. She took ibuprofen with some relief. This morning she was feeling well, went to work, but developed chills again. She called her oncologist and was told to come here. Of note, patient is currently on chemotherapy, last treatment was 9 days ago. She has had 3 treatments so far. Neulasta treatment 3 days ago.   Work up in the ED demonstrates neutropenia with WBC 3.1, negative urinalysis, negative chest x-ray for acute abnormalities, lactate 2.30. Patient is admitted to inpatient for further evaluation and treatment.  Consultants:  Marko Plume- oncology   Procedures:None  Antibiotics Vancomycin and cefepime   HPI/Subjective: Patient has no complaints feeling well  Subjective  Filed Vitals:   01/28/14 0251 01/28/14 0453 01/28/14 1014 01/28/14 1313  BP:  125/47 116/53 128/57  Pulse:  95 85 78  Temp: 99.2 F (37.3 C) 99.8 F (37.7 C) 98.8 F (37.1 C) 98.3 F (36.8 C)  TempSrc: Oral Oral Oral Axillary  Resp:  18 16 18   Height:  Weight:      SpO2:  95% 96% 97%    Intake/Output Summary (Last 24 hours) at 01/28/14 1328 Last data filed at 01/28/14 1022  Gross per 24 hour  Intake 1512.08 ml   Output   2150 ml  Net -637.92 ml    Exam:  General: alert & oriented x 3 In NAD  Cardiovascular: RRR, nl S1 s2  Respiratory: Decreased breath sounds at the bases, scattered rhonchi, no crackles  Abdomen: soft +BS NT/ND, no masses palpable  Extremities: No cyanosis and no edema      Data Reviewed: Basic Metabolic Panel:  Recent Labs Lab 01/24/14 1054 01/27/14 1745 01/28/14 0457  NA 138 139 137  K 4.1 3.9 3.4*  CL  --  101 104  CO2 23 20 20   GLUCOSE 140 94 106*  BUN 13.3 9 8   CREATININE 0.8 0.71 0.74  CALCIUM 8.8 9.4 8.2*    Liver Function Tests:  Recent Labs Lab 01/24/14 1054 01/27/14 1745 01/28/14 0457  AST 57* 52* 37  ALT 81* 69* 47*  ALKPHOS 129 105 86  BILITOT 0.53 1.4* 0.9  PROT 7.9 8.4* 6.3  ALBUMIN 3.7 4.0 3.0*   No results for input(s): LIPASE, AMYLASE in the last 168 hours. No results for input(s): AMMONIA in the last 168 hours.  CBC:  Recent Labs Lab 01/24/14 1054 01/27/14 1745 01/28/14 0457  WBC 1.5* 3.1* 4.6  NEUTROABS 0.6* 0.8* 1.9  HGB 10.9* 10.8* 8.7*  HCT 34.2* 33.7* 26.9*  MCV 90.5 90.6 90.3  PLT 111* 133* 103*    Cardiac Enzymes: No results for input(s): CKTOTAL, CKMB, CKMBINDEX, TROPONINI in the last 168 hours. BNP (last 3 results) No results for input(s): PROBNP in the last 8760 hours.   CBG:  Recent Labs Lab 01/27/14 2303 01/28/14 0731 01/28/14 1148  GLUCAP 139* 159* 136*    Recent Results (from the past 240 hour(s))  Blood culture (routine x 2)     Status: None (Preliminary result)   Collection Time: 01/27/14  5:45 PM  Result Value Ref Range Status   Specimen Description BLOOD RIGHT ANTECUBITAL  Final   Special Requests BOTTLES DRAWN AEROBIC AND ANAEROBIC 5ML  Final   Culture  Setup Time   Final    01/27/2014 22:38 Performed at [facility]    Culture   Final           BLOOD CULTURE RECEIVED NO GROWTH TO DATE CULTURE WILL BE HELD FOR 5 DAYS BEFORE ISSUING A FINAL NEGATIVE REPORT Performed at  [facility]    Report Status PENDING  Incomplete  Blood culture (routine x 2)     Status: None (Preliminary result)   Collection Time: 01/27/14  5:56 PM  Result Value Ref Range Status   Specimen Description BLOOD LEFT WRIST  Final   Special Requests BOTTLES DRAWN AEROBIC AND ANAEROBIC 3ML  Final   Culture  Setup Time   Final    01/27/2014 22:39 Performed at [facility]    Culture   Final           BLOOD CULTURE RECEIVED NO GROWTH TO DATE CULTURE WILL BE HELD FOR 5 DAYS BEFORE ISSUING A FINAL NEGATIVE REPORT Performed at [facility]    Report Status PENDING  Incomplete     Studies: Dg Chest 2 View  01/28/2014   CLINICAL DATA:  Cough  EXAM: CHEST  2 VIEW  COMPARISON:  01/27/2014  FINDINGS: The heart size and mediastinal contours are  within normal limits. Atelectasis is noted within the lung bases. The visualized skeletal structures are unremarkable.  IMPRESSION: 1. Bibasilar atelectasis.   Electronically Signed   By: Kerby Moors M.D.   On: 01/28/2014 10:59   Dg Chest 2 View  01/27/2014   CLINICAL DATA:  Fever, cough.  EXAM: CHEST  2 VIEW  COMPARISON:  October 24, 2010.  FINDINGS: The heart size and mediastinal contours are within normal limits. No pneumothorax or pleural effusion is noted. Stable linear scarring is noted in the lingular and right middle lobe regions. No acute pulmonary disease is noted. The visualized skeletal structures are unremarkable.  IMPRESSION: No acute cardiopulmonary abnormality seen.   Electronically Signed   By: Sabino Dick M.D.   On: 01/27/2014 19:32    Scheduled Meds: . antiseptic oral rinse  7 mL Mouth Rinse BID  . aspirin  81 mg Oral Daily  . bisoprolol  2.5 mg Oral Daily  . ceFEPime (MAXIPIME) IV  2 g Intravenous Q8H  . estradiol  1 mg Oral QHS  . heparin  5,000 Units Subcutaneous 3 times per day  . insulin aspart  0-9 Units Subcutaneous TID WC  . loratadine  10 mg Oral Daily  . montelukast  10 mg Oral QHS  .  pantoprazole  40 mg Oral Daily  . vancomycin  1,000 mg Intravenous Q12H   Continuous Infusions: . 0.9 % sodium chloride with kcl 75 mL/hr at 01/28/14 1211    Principal Problem:   Fever and neutropenia Active Problems:   Essential hypertension   Disseminated ovarian cancer   Liver cirrhosis secondary to NASH   Pancytopenia due to chemotherapy   Asthma     Time spent: 40 minutes   [facility] Pager [phone]. If 7PM-7AM, please contact night-coverage at [email or web address], password [facility] 01/28/2014, 1:28 PM  LOS: 1 day

## 2014-01-28 NOTE — Progress Notes (Signed)
01/28/2014, 5:57 PM  Hospital day 2 Antibiotics: vanc, maxipime Chemotherapy: cycle 3 carboplatin taxol 01-18-14 with neulasta 01-24-14  Physicians outpatient: Everitt Amber; Helane Rima, MD (PCP), Mardene Speak, M.Wert, P.Nahser, M.Stark/ J.Mann. L.Livesay   Appreciate notification of admission on 01-27-14 by hospitalist service for febrile neutropenia. Patient is under active treatment with chemotherapy as initial intervention for advanced serous gyn carcinoma, which is clinically responding to present treatment. Plan is to repeat scans after cycle 4 in January, then consider debulking surgery by gyn oncology depending on response. Significant comorbidities include cirrhosis likely secondary to NASH and chronic cough treated by Dr [name].  Subjective: Feeling better thru day today. More cough than she has had recently, NP. Restless legs last pm and hardly slept. No other localizing symptoms of infection. Aches from taxol and gCSF nearly resolved. Ate all of supper. No nausea. Has been up to BR.    ONCOLOGIC HISTORY Patient initially had problems in ~ July 2015 with stress urinary incontinence related to severe chronic cough. Urine culture by PCP showed no infection and she was referred to Dr Clois Comber, who adjusted medications with great improvement in the cough. Despite improved cough, she continued with some urinary incontinence. She saw Dr Ronita Hipps in 10-2013, with pelvic mass on exam and CA 125 1195 on 11-08-13. She had CT AP 11-12-13, which showed large ascites, irregular and shrunken liver, mild splenomegaly, increased soft tissue in pelvis bilaterally and anterior mid and lower peritoneum, small left pleural effusion. She saw Dr Denman George in consultation on 11-15-13, who agreed that this was likely gyn malignancy. She had US paracentesis of 4.1 liters on 11-18-13, with cytology ([id number]) and immunohistochemical stains consistent with serous carcinoma of Mullerian tract primary, favor ovarian. She had  second paracentesis for 3.6 liters on 12-03-13. Gyn oncology recommended initial treatment with chemotherapy. She had first carboplatin and taxol on 12-07-13, with carboplatin dosed at AUC=4 and taxol at 135 mg/m2 due to cirrhosis.   Objective: Vital signs in last 24 hours: Blood pressure 128/57, pulse 78, temperature 98.3 F (36.8 C), temperature source Axillary, resp. rate 18, height 5' 1.5" (1.562 m), weight 175 lb (79.379 kg), SpO2 97 %.   Intake/Output from previous day: 12/17 0701 - 12/18 0700 In: 1512.1 [P.O.:360; I.V.:902.1; IV Piggyback:250] Out: 1000 [Urine:1000] Intake/Output this shift: Total I/O In: -  Out: 1750 [Urine:1750]  Physical exam:awake, alert, oriented and appropriate. NAD but frequent dry cough not deep. Respirations not labored RA. Alopecia. Oral mucosa moist and clear. PERRL, not icteric. Neck supple without JVD. No central catheter, peripheral IV left wrist, site ok. Heart RRR no gallop. Lungs with fine crackles right anterior > left, no wheeze or rales. Abdomen full, not distended, not tender, some BS, no clear HSM. LE no pitting edema, cords, tenderness. Skin without rash, ecchymosis, petechiae. Moves all extremities, speech fluent and appropriate. PSYCH appropriate mood and affect.   Lab Results:  Recent Labs  01/27/14 1745 01/28/14 0457  WBC 3.1* 4.6  HGB 10.8* 8.7*  HCT 33.7* 26.9*  PLT 133* 103*  ANC on presentation to ED 0.8 and this AM up to 1.9, so is no longer neutropenic BMET  Recent Labs  01/27/14 1745 01/28/14 0457  NA 139 137  K 3.9 3.4*  CL 101 104  CO2 20 20  GLUCOSE 94 106*  BUN 9 8  CREATININE 0.71 0.74  CALCIUM 9.4 8.2*  blood cultures and urine culture from admission in process Urine Legionella and urine strep negative  Studies/Results: Dg Chest 2  View  01/28/2014   CLINICAL DATA:  Cough  EXAM: CHEST  2 VIEW  COMPARISON:  01/27/2014  FINDINGS: The heart size and mediastinal contours are within normal limits.  Atelectasis is noted within the lung bases. The visualized skeletal structures are unremarkable.  IMPRESSION: 1. Bibasilar atelectasis.   Electronically Signed   By: Kerby Moors M.D.   On: 01/28/2014 10:59   Dg Chest 2 View  01/27/2014   CLINICAL DATA:  Fever, cough.  EXAM: CHEST  2 VIEW  COMPARISON:  October 24, 2010.  FINDINGS: The heart size and mediastinal contours are within normal limits. No pneumothorax or pleural effusion is noted. Stable linear scarring is noted in the lingular and right middle lobe regions. No acute pulmonary disease is noted. The visualized skeletal structures are unremarkable.  IMPRESSION: No acute cardiopulmonary abnormality seen.   Electronically Signed   By: Sabino Dick M.D.   On: 01/27/2014 19:32     Assessment/Plan: 1. Fever, neutropenia on admission in patient on chemotherapy: ANC is now >1.0 so no longer neutropenic. If afebrile by tomorrow could consider DC vanc and follow cultures/ temperature ~ another 24 hours on maxipime. She had neulasta day 7 cycle 3, so expect WBC/ANC to continue to improve from here. 2.advanced high grade serous gyn carcinoma, clinically IIIC ovarian: treatment begun with carboplatin taxol chemotherapy, cycle 1 on 12-07-13 and cycle 3 on 01-18-14. She has not required paracentesis since 12-20-13 and CA 125 was down to 227 on 01-13-14 from 762 in Nov. 3.Hepatic cirrhosis presumed secondary to NASH, with portal HTN, splenomegaly: she has not had liver decompensation with careful chemotherapy to date. Known to Dr Fuller Plan 4.chronic cough x years: markedly improved with interventions by Dr [name] shortly prior to start of chemotherapy. 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 Daily CBC. Would hold prophylactic heparin if <100k 8. nonSTEMI 1012 9.restless legs: severe, very disruptive to sleep, worse with benadryl. She  was able to sleep PTA with tramadol + ativan. I have ordered hs doses of these medications, discussed with RN. 10. Narrow angle glaucoma 11.flu vaccine done 12.obesity: BMI 32  Please call over weekend if our service can be of help   [place] Pager [phone] Office [phone]

## 2014-01-28 NOTE — Telephone Encounter (Signed)
Received VM from Yvone Neu, patient's significant other, wanting to let Dr. Marko Plume know that pt is currently admitted at [facility] as of 01/27/14. Message passed along to Dr. Marko Plume and call returned to Yvone Neu to let him know we got his VM and Dr. Marko Plume has been made aware.

## 2014-01-29 LAB — CBC WITH DIFFERENTIAL/PLATELET
Basophils Absolute: 0.1 10*3/uL (ref 0.0–0.1)
Basophils Relative: 1 % (ref 0–1)
Eosinophils Absolute: 0.2 10*3/uL (ref 0.0–0.7)
Eosinophils Relative: 1 % (ref 0–5)
HCT: 29.5 % — ABNORMAL LOW (ref 36.0–46.0)
Hemoglobin: 9.3 g/dL — ABNORMAL LOW (ref 12.0–15.0)
Lymphocytes Relative: 15 % (ref 12–46)
Lymphs Abs: 1.7 10*3/uL (ref 0.7–4.0)
MCH: 28.9 pg (ref 26.0–34.0)
MCHC: 31.5 g/dL (ref 30.0–36.0)
MCV: 91.6 fL (ref 78.0–100.0)
Monocytes Absolute: 1.2 10*3/uL — ABNORMAL HIGH (ref 0.1–1.0)
Monocytes Relative: 11 % (ref 3–12)
Neutro Abs: 8.3 10*3/uL — ABNORMAL HIGH (ref 1.7–7.7)
Neutrophils Relative %: 73 % (ref 43–77)
Platelets: 112 10*3/uL — ABNORMAL LOW (ref 150–400)
RBC: 3.22 MIL/uL — ABNORMAL LOW (ref 3.87–5.11)
RDW: 19.5 % — ABNORMAL HIGH (ref 11.5–15.5)
WBC: 11.5 10*3/uL — ABNORMAL HIGH (ref 4.0–10.5)

## 2014-01-29 LAB — COMPREHENSIVE METABOLIC PANEL
ALT: 44 U/L — ABNORMAL HIGH (ref 0–35)
AST: 33 U/L (ref 0–37)
Albumin: 3 g/dL — ABNORMAL LOW (ref 3.5–5.2)
Alkaline Phosphatase: 107 U/L (ref 39–117)
Anion gap: 11 (ref 5–15)
BUN: 8 mg/dL (ref 6–23)
CO2: 22 mEq/L (ref 19–32)
Calcium: 8.6 mg/dL (ref 8.4–10.5)
Chloride: 108 mEq/L (ref 96–112)
Creatinine, Ser: 0.77 mg/dL (ref 0.50–1.10)
GFR calc Af Amer: >90 mL/min (ref >90)
GFR calc non Af Amer: 86 mL/min — ABNORMAL LOW (ref >90)
Glucose, Bld: 121 mg/dL — ABNORMAL HIGH (ref 70–99)
Potassium: 4 mEq/L (ref 3.7–5.3)
Sodium: 141 mEq/L (ref 137–147)
Total Bilirubin: 0.6 mg/dL (ref 0.3–1.2)
Total Protein: 6.8 g/dL (ref 6.0–8.3)

## 2014-01-29 LAB — GLUCOSE, CAPILLARY
Glucose-Capillary: 112 mg/dL — ABNORMAL HIGH (ref 70–99)
Glucose-Capillary: 146 mg/dL — ABNORMAL HIGH (ref 70–99)
Glucose-Capillary: 91 mg/dL (ref 70–99)
Glucose-Capillary: 123 mg/dL — ABNORMAL HIGH (ref 70–99)

## 2014-01-29 LAB — URINE CULTURE
Colony Count: NO GROWTH
Culture: NO GROWTH

## 2014-01-29 NOTE — Progress Notes (Signed)
[facility] PROGRESS NOTE  RAEANNA SOBERANES XOV:[id number] DOB: 02-Jul-1945 DOA: 01/27/2014 PCP: Helane Rima, MD  Assessment/Plan: Neutropenic Fever -Neutropenia has resolved. -Has been afebrile overnight. -Will Dc vancomycin today. -If remains afebrile overnight, consider DC home in the morning on levaquin. -All cx remain negative to date. -Appreciate onc input.  HTN -Well controlled  DM  CAD -Stable, no CP. -Continue ASA/BB  Ovarian Cancer -Management as per oncology.  Pancytopenia -Improving with G-CSF.  Cirrhosis -2/2 NASH -Stable LFTs.  Code Status: Full Code Family Communication: Husband at bedside Disposition Plan: Home in am if afebrile and cx remain negative   Consultants:  Oncology   Antibiotics:  Cefepime  Subjective: No complaints. Wants to go home.  Objective: Filed Vitals:   01/28/14 1313 01/28/14 2058 01/29/14 0505 01/29/14 1111  BP: 128/57 111/57 113/60 110/72  Pulse: 78 82 71 69  Temp: 98.3 F (36.8 C) 98.3 F (36.8 C) 98.2 F (36.8 C) 98 F (36.7 C)  TempSrc: Axillary Oral Oral Oral  Resp: 18 16 18 16   Height:      Weight:      SpO2: 97% 97% 97% 99%    Intake/Output Summary (Last 24 hours) at 01/29/14 1745 Last data filed at 01/29/14 1745  Gross per 24 hour  Intake 2887.5 ml  Output    750 ml  Net 2137.5 ml   Filed Weights   01/27/14 2206  Weight: 79.379 kg (175 lb)    Exam:   General:  AA Ox3  Cardiovascular: RRR  Respiratory: CTA B  Abdomen: distended/+BS  Extremities: no C/C/E  Neurologic:  Non-focal  Data Reviewed: Basic Metabolic Panel:  Recent Labs Lab 01/24/14 1054 01/27/14 1745 01/28/14 0457 01/29/14 0450  NA 138 139 137 141  K 4.1 3.9 3.4* 4.0  CL  --  101 104 108  CO2 23 20 20 22   GLUCOSE 140 94 106* 121*  BUN 13.3 9 8 8   CREATININE 0.8 0.71 0.74 0.77  CALCIUM 8.8 9.4 8.2* 8.6   Liver Function Tests:  Recent Labs Lab 01/24/14 1054 01/27/14 1745 01/28/14 0457  01/29/14 0450  AST 57* 52* 37 33  ALT 81* 69* 47* 44*  ALKPHOS 129 105 86 107  BILITOT 0.53 1.4* 0.9 0.6  PROT 7.9 8.4* 6.3 6.8  ALBUMIN 3.7 4.0 3.0* 3.0*   No results for input(s): LIPASE, AMYLASE in the last 168 hours. No results for input(s): AMMONIA in the last 168 hours. CBC:  Recent Labs Lab 01/24/14 1054 01/27/14 1745 01/28/14 0457 01/29/14 0450  WBC 1.5* 3.1* 4.6 11.5*  NEUTROABS 0.6* 0.8* 1.9 8.3*  HGB 10.9* 10.8* 8.7* 9.3*  HCT 34.2* 33.7* 26.9* 29.5*  MCV 90.5 90.6 90.3 91.6  PLT 111* 133* 103* 112*   Cardiac Enzymes: No results for input(s): CKTOTAL, CKMB, CKMBINDEX, TROPONINI in the last 168 hours. BNP (last 3 results) No results for input(s): PROBNP in the last 8760 hours. CBG:  Recent Labs Lab 01/28/14 1652 01/28/14 2102 01/29/14 0741 01/29/14 1156 01/29/14 1633  GLUCAP 119* 116* 112* 146* 91    Recent Results (from the past 240 hour(s))  Blood culture (routine x 2)     Status: None (Preliminary result)   Collection Time: 01/27/14  5:45 PM  Result Value Ref Range Status   Specimen Description BLOOD RIGHT ANTECUBITAL  Final   Special Requests BOTTLES DRAWN AEROBIC AND ANAEROBIC 5ML  Final   Culture  Setup Time   Final    01/27/2014 22:38 Performed at  [facility]   Final           BLOOD CULTURE RECEIVED NO GROWTH TO DATE CULTURE WILL BE HELD FOR 5 DAYS BEFORE ISSUING A FINAL NEGATIVE REPORT Performed at [facility]    Report Status PENDING  Incomplete  Blood culture (routine x 2)     Status: None (Preliminary result)   Collection Time: 01/27/14  5:56 PM  Result Value Ref Range Status   Specimen Description BLOOD LEFT WRIST  Final   Special Requests BOTTLES DRAWN AEROBIC AND ANAEROBIC 3ML  Final   Culture  Setup Time   Final    01/27/2014 22:39 Performed at [facility]    Culture   Final           BLOOD CULTURE RECEIVED NO GROWTH TO DATE CULTURE WILL BE HELD FOR 5 DAYS BEFORE ISSUING A FINAL NEGATIVE  REPORT Performed at [facility]    Report Status PENDING  Incomplete  Urine culture     Status: None   Collection Time: 01/27/14 11:23 PM  Result Value Ref Range Status   Specimen Description URINE, CLEAN CATCH  Final   Special Requests NONE  Final   Culture  Setup Time   Final    01/28/2014 05:07 Performed at [place] Performed at [facility]   Final   Culture NO GROWTH Performed at [facility]   Final   Report Status 01/29/2014 FINAL  Final     Studies: Dg Chest 2 View  01/28/2014   CLINICAL DATA:  Cough  EXAM: CHEST  2 VIEW  COMPARISON:  01/27/2014  FINDINGS: The heart size and mediastinal contours are within normal limits. Atelectasis is noted within the lung bases. The visualized skeletal structures are unremarkable.  IMPRESSION: 1. Bibasilar atelectasis.   Electronically Signed   By: Kerby Moors M.D.   On: 01/28/2014 10:59   Dg Chest 2 View  01/27/2014   CLINICAL DATA:  Fever, cough.  EXAM: CHEST  2 VIEW  COMPARISON:  October 24, 2010.  FINDINGS: The heart size and mediastinal contours are within normal limits. No pneumothorax or pleural effusion is noted. Stable linear scarring is noted in the lingular and right middle lobe regions. No acute pulmonary disease is noted. The visualized skeletal structures are unremarkable.  IMPRESSION: No acute cardiopulmonary abnormality seen.   Electronically Signed   By: Sabino Dick M.D.   On: 01/27/2014 19:32    Scheduled Meds: . antiseptic oral rinse  7 mL Mouth Rinse BID  . aspirin  81 mg Oral Daily  . bisoprolol  2.5 mg Oral Daily  . ceFEPime (MAXIPIME) IV  2 g Intravenous Q8H  . estradiol  1 mg Oral QHS  . heparin  5,000 Units Subcutaneous 3 times per day  . insulin aspart  0-9 Units Subcutaneous TID WC  . loratadine  10 mg Oral Daily  . LORazepam  1 mg Oral QHS  . montelukast  10 mg Oral QHS  . pantoprazole  40 mg Oral Daily   Continuous Infusions: . 0.9  % sodium chloride with kcl 75 mL/hr at 01/29/14 0436    Principal Problem:   Fever and neutropenia Active Problems:   Essential hypertension   Disseminated ovarian cancer   Liver cirrhosis secondary to NASH   Pancytopenia due to chemotherapy   Asthma    Time spent: 35 minutes. Greater than 50% of this  time was spent in direct contact with the patient coordinating care.    Lelon Frohlich  [facility] Pager [phone]  If 7PM-7AM, please contact night-coverage at [email or web address], password [email or web address] 01/29/2014, 5:45 PM  LOS: 2 days

## 2014-01-30 LAB — CBC
HCT: 29.3 % — ABNORMAL LOW (ref 36.0–46.0)
Hemoglobin: 9.1 g/dL — ABNORMAL LOW (ref 12.0–15.0)
MCH: 28.4 pg (ref 26.0–34.0)
MCHC: 31.1 g/dL (ref 30.0–36.0)
MCV: 91.6 fL (ref 78.0–100.0)
Platelets: 109 10*3/uL — ABNORMAL LOW (ref 150–400)
RBC: 3.2 MIL/uL — ABNORMAL LOW (ref 3.87–5.11)
RDW: 19.5 % — ABNORMAL HIGH (ref 11.5–15.5)
WBC: 11.7 10*3/uL — ABNORMAL HIGH (ref 4.0–10.5)

## 2014-01-30 LAB — COMPREHENSIVE METABOLIC PANEL
ALT: 40 U/L — ABNORMAL HIGH (ref 0–35)
AST: 35 U/L (ref 0–37)
Albumin: 2.9 g/dL — ABNORMAL LOW (ref 3.5–5.2)
Alkaline Phosphatase: 120 U/L — ABNORMAL HIGH (ref 39–117)
Anion gap: 11 (ref 5–15)
BUN: 8 mg/dL (ref 6–23)
CO2: 22 mEq/L (ref 19–32)
Calcium: 7.8 mg/dL — ABNORMAL LOW (ref 8.4–10.5)
Chloride: 107 mEq/L (ref 96–112)
Creatinine, Ser: 0.84 mg/dL (ref 0.50–1.10)
GFR calc Af Amer: 82 mL/min — ABNORMAL LOW (ref >90)
GFR calc non Af Amer: 71 mL/min — ABNORMAL LOW (ref >90)
Glucose, Bld: 112 mg/dL — ABNORMAL HIGH (ref 70–99)
Potassium: 4 mEq/L (ref 3.7–5.3)
Sodium: 140 mEq/L (ref 137–147)
Total Bilirubin: 0.5 mg/dL (ref 0.3–1.2)
Total Protein: 6.4 g/dL (ref 6.0–8.3)

## 2014-01-30 LAB — GLUCOSE, CAPILLARY: Glucose-Capillary: 108 mg/dL — ABNORMAL HIGH (ref 70–99)

## 2014-01-30 MED ORDER — LEVOFLOXACIN 750 MG PO TABS: 750.0000 mg | ORAL_TABLET | Freq: Every day | ORAL | Status: DC

## 2014-01-30 NOTE — Discharge Summary (Signed)
Physician Discharge Summary  Olivia Benson DZH:[id number] DOB: 06-02-1945 DOA: 01/27/2014  PCP: Helane Rima, MD  Admit date: 01/27/2014 Discharge date: 01/30/2014  Time spent: 45 minutes  Recommendations for Outpatient Follow-up:  -Will be discharged home today. -Will follow up with Dr. Marko Plume as previously scheduled.  Discharge Diagnoses:  Principal Problem:   Fever and neutropenia Active Problems:   Essential hypertension   Disseminated ovarian cancer   Liver cirrhosis secondary to NASH   Pancytopenia due to chemotherapy   Asthma   Discharge Condition: Stable and improved  Filed Weights   01/27/14 2206  Weight: 79.379 kg (175 lb)    History of present illness:  Olivia Benson is a 68 y.o. 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  Patient reports that she started having fever and cough yesterday. She had temperature of 101.2. Her cough is dry cough, no shortness of breath or chest pain. Patient reports no nasal congestion, slightly scratchy throat, no pain in her ears, no wheezing. Patient does not have symptoms for UTI. No nausea, vomiting, abdominal pain or diarrhea. She took ibuprofen with some relief. This morning she was feeling well, went to work, but developed chills again. She called her oncologist and was told to come here. Of note, patient is currently on chemotherapy, last treatment was 9 days ago. She has had 3 treatments so far. Neulasta treatment 3 days ago.   Work up in the ED demonstrates neutropenia with WBC 3.1, negative urinalysis, negative chest x-ray for acute abnormalities, lactate 2.30. Patient is admitted to inpatient for further evaluation and treatment.  Hospital Course:   Neutropenic Fever -Neutropenia has resolved. -Has been afebrile. -Will transition to levaquin for DC home. -All cx remain negative to  date. -Appreciate onc input.  HTN -Well controlled  DM -Well controlled.  CAD -Stable, no CP. -Continue ASA/BB  Ovarian Cancer -Management as per oncology.  Pancytopenia -Improving with G-CSF.  Cirrhosis -2/2 NASH -Stable LFTs.  Procedures:  None  Consultations:  Oncology  Discharge Instructions  Discharge Instructions    Increase activity slowly    Complete by:  As directed             Medication List    TAKE these medications        albuterol 108 (90 BASE) MCG/ACT inhaler  Commonly known as:  PROVENTIL HFA;VENTOLIN HFA  Inhale 2 puffs into the lungs every 4 (four) hours as needed.     aspirin 81 MG tablet  Take 81 mg by mouth daily.     baclofen 10 MG tablet  Commonly known as:  LIORESAL  Take 10 mg by mouth daily as needed for muscle spasms (muscle spasms).     bisoprolol 5 MG tablet  Commonly known as:  ZEBETA  Take 1/2 tablet daily     dexamethasone 4 MG tablet  Commonly known as:  DECADRON  Take 5 tabs with food(=53m) 12 hrs6 hrs prior to Taxol chemotherapy     docusate sodium 100 MG capsule  Commonly known as:  COLACE  Take 100 mg by mouth daily as needed for mild constipation (constipation).     estradiol 1 MG tablet  Commonly known as:  ESTRACE  Take 1 mg by mouth at bedtime.     famotidine 20 MG tablet  Commonly known as:  PEPCID  Take 20 mg by mouth at bedtime as needed  for heartburn (cough).     ibuprofen 200 MG tablet  Commonly known as:  ADVIL,MOTRIN  Take 800 mg by mouth every 6 (six) hours as needed for moderate pain (pain).     levofloxacin 750 MG tablet  Commonly known as:  LEVAQUIN  Take 1 tablet (750 mg total) by mouth daily.     loratadine 10 MG tablet  Commonly known as:  CLARITIN  Take 10 mg by mouth daily.     LORazepam 1 MG tablet  Commonly known as:  ATIVAN  Place 1 tablet under the tongue or swallow every 6 hrs as needed for nausea or as directed     metFORMIN 500 MG 24 hr tablet  Commonly known  as:  GLUCOPHAGE-XR  Take 500 mg by mouth daily.     MICROLET LANCETS Misc  Test 1  times daily. Dx 250.02     mometasone-formoterol 100-5 MCG/ACT Aero  Commonly known as:  DULERA  Inhale 2 puffs into the lungs as needed for wheezing or shortness of breath.     montelukast 10 MG tablet  Commonly known as:  SINGULAIR  Take 10 mg by mouth at bedtime.     ondansetron 8 MG tablet  Commonly known as:  ZOFRAN  Take 1 tablet (8 mg total) by mouth every 8 (eight) hours as needed for nausea or vomiting.     pantoprazole 40 MG tablet  Commonly known as:  PROTONIX  Take 40 mg by mouth daily.     prochlorperazine 10 MG tablet  Commonly known as:  COMPAZINE  Take 1 tablet (10 mg total) by mouth every 6 (six) hours as needed for nausea or vomiting.     sennosides-docusate sodium 8.6-50 MG tablet  Commonly known as:  SENOKOT-S  Take 1 tablet by mouth 2 (two) times daily as needed for constipation (constipation).     traMADol 50 MG tablet  Commonly known as:  ULTRAM  Take 1 tablet (50 mg total) by mouth every 8 (eight) hours as needed for moderate pain.       Allergies  Allergen Reactions  . Shellfish Allergy     HER THROAT SWELLS UP AND CLOSES  . Penicillins Rash    WHEN SHE WAS TEENAGER       Follow-up Information    Follow up with Helane Rima, MD. Schedule an appointment as soon as possible for a visit in 2 weeks.   Specialty:  Family Medicine   Contact information:   [address] [id number] [phone]        The results of significant diagnostics from this hospitalization (including imaging, microbiology, ancillary and laboratory) are listed below for reference.    Significant Diagnostic Studies: Dg Chest 2 View  01/28/2014   CLINICAL DATA:  Cough  EXAM: CHEST  2 VIEW  COMPARISON:  01/27/2014  FINDINGS: The heart size and mediastinal contours are within normal limits. Atelectasis is noted within the lung bases. The visualized  skeletal structures are unremarkable.  IMPRESSION: 1. Bibasilar atelectasis.   Electronically Signed   By: Kerby Moors M.D.   On: 01/28/2014 10:59   Dg Chest 2 View  01/27/2014   CLINICAL DATA:  Fever, cough.  EXAM: CHEST  2 VIEW  COMPARISON:  October 24, 2010.  FINDINGS: The heart size and mediastinal contours are within normal limits. No pneumothorax or pleural effusion is noted. Stable linear scarring is noted in the lingular and right middle lobe regions. No acute  pulmonary disease is noted. The visualized skeletal structures are unremarkable.  IMPRESSION: No acute cardiopulmonary abnormality seen.   Electronically Signed   By: Sabino Dick M.D.   On: 01/27/2014 19:32    Microbiology: Recent Results (from the past 240 hour(s))  Blood culture (routine x 2)     Status: None (Preliminary result)   Collection Time: 01/27/14  5:45 PM  Result Value Ref Range Status   Specimen Description BLOOD RIGHT ANTECUBITAL  Final   Special Requests BOTTLES DRAWN AEROBIC AND ANAEROBIC 5ML  Final   Culture  Setup Time   Final    01/27/2014 22:38 Performed at [facility]    Culture   Final           BLOOD CULTURE RECEIVED NO GROWTH TO DATE CULTURE WILL BE HELD FOR 5 DAYS BEFORE ISSUING A FINAL NEGATIVE REPORT Performed at [facility]    Report Status PENDING  Incomplete  Blood culture (routine x 2)     Status: None (Preliminary result)   Collection Time: 01/27/14  5:56 PM  Result Value Ref Range Status   Specimen Description BLOOD LEFT WRIST  Final   Special Requests BOTTLES DRAWN AEROBIC AND ANAEROBIC 3ML  Final   Culture  Setup Time   Final    01/27/2014 22:39 Performed at [facility]    Culture   Final           BLOOD CULTURE RECEIVED NO GROWTH TO DATE CULTURE WILL BE HELD FOR 5 DAYS BEFORE ISSUING A FINAL NEGATIVE REPORT Performed at [facility]    Report Status PENDING  Incomplete  Urine culture     Status: None   Collection Time: 01/27/14 11:23 PM    Result Value Ref Range Status   Specimen Description URINE, CLEAN CATCH  Final   Special Requests NONE  Final   Culture  Setup Time   Final    01/28/2014 05:07 Performed at [place] Performed at [facility]   Final   Culture NO GROWTH Performed at [facility]   Final   Report Status 01/29/2014 FINAL  Final     Labs: Basic Metabolic Panel:  Recent Labs Lab 01/24/14 1054 01/27/14 1745 01/28/14 0457 01/29/14 0450 01/30/14 0513  NA 138 139 137 141 140  K 4.1 3.9 3.4* 4.0 4.0  CL  --  101 104 108 107  CO2 23 20 20 22 22   GLUCOSE 140 94 106* 121* 112*  BUN 13.3 9 8 8 8   CREATININE 0.8 0.71 0.74 0.77 0.84  CALCIUM 8.8 9.4 8.2* 8.6 7.8*   Liver Function Tests:  Recent Labs Lab 01/24/14 1054 01/27/14 1745 01/28/14 0457 01/29/14 0450 01/30/14 0513  AST 57* 52* 37 33 35  ALT 81* 69* 47* 44* 40*  ALKPHOS 129 105 86 107 120*  BILITOT 0.53 1.4* 0.9 0.6 0.5  PROT 7.9 8.4* 6.3 6.8 6.4  ALBUMIN 3.7 4.0 3.0* 3.0* 2.9*   No results for input(s): LIPASE, AMYLASE in the last 168 hours. No results for input(s): AMMONIA in the last 168 hours. CBC:  Recent Labs Lab 01/24/14 1054 01/27/14 1745 01/28/14 0457 01/29/14 0450 01/30/14 0513  WBC 1.5* 3.1* 4.6 11.5* 11.7*  NEUTROABS 0.6* 0.8* 1.9 8.3*  --   HGB 10.9* 10.8* 8.7* 9.3* 9.1*  HCT 34.2* 33.7* 26.9* 29.5* 29.3*  MCV 90.5 90.6 90.3 91.6 91.6  PLT 111* 133* 103* 112* 109*  Cardiac Enzymes: No results for input(s): CKTOTAL, CKMB, CKMBINDEX, TROPONINI in the last 168 hours. BNP: BNP (last 3 results) No results for input(s): PROBNP in the last 8760 hours. CBG:  Recent Labs Lab 01/29/14 0741 01/29/14 1156 01/29/14 1633 01/29/14 2125 01/30/14 0737  GLUCAP 112* 146* 91 123* 108*       Signed:  HERNANDEZ ACOSTA,ESTELA  [facility] Pager: [phone] 01/30/2014, 7:12 PM

## 2014-01-30 NOTE — Progress Notes (Signed)
[facility] day 4 Chemotherapy: cycle 3 carboplatin taxol 01-18-14 with neulasta 01-24-14  Physicians outpatient: Everitt Amber; Helane Rima, MD (PCP), Mardene Speak, M.Wert, P.Nahser, M.Stark/ J.Mann. L.Gabriellah Rabel     EMR reviewed, for DC now. Patient seen, with RN and significant other Yvone Neu here.  Has been afebrile, cultures no growth to date but not yet final. Chronic cough improved again. She is tired and still feels rather weak, has not had much rest in hospital. She is not SOB at rest. Abdomen feels more full since the IVF. Bowels moved last pm and this morning. She is eating. She denies pain.  Standing beside bed initially, a little dyspneic then. Temp 97.4, HR 76 regular, resp 18 not labored RA seated. 109/60, O2 sat 98%. Pale, not icteric. PERRL. Oral mucosa moist. Alopecia. Lungs clear to A and P, no cough. No JVD. IV out/ no central catheter. Heart RRR. Abdomen full in upper quadrants, not so much lower quadrants, few BS, not tender. LE no edema, cords, tenderness. No bleeding at site of venipuncture left wrist. Speech fluent and appropriate, moves all extremities easily.  Blood and urine cultures from 12-17 as noted. CMET today with Na 140, K 4.0, glucose 112, creat 0.84, T prot 6.4, alb 2.9, AST 35, ALT 40 (improved), AP 120 (up), Tbili 0.5 CBC today WBC 11.7, Hgb 9.1, plt 109    Assessment/ Plan 1. Fever, neutropenia on admission from recent chemotherapy, neulasta 01-24-14 : Sarben recovered witnin 24 hours of admission. She is to be DC home on levaquin x 5 days. I have asked patient/ Yvone Neu to call my RN on 01-31-14 to let us know how she is. We will follow up culture results and may DC levaquin prior to 5 days if these are negative. She is to see me next on 02-07-14. 2.advanced high grade serous gyn carcinoma, clinically IIIC ovarian: treatment begun with carboplatin taxol chemotherapy, cycle 1 on 12-07-13 and cycle 3 on 01-18-14. She has not required paracentesis since  12-20-13 and CA 125 was down to 227 on 01-13-14 from 762 in Nov. F/u at [facility] as scheduled 02-07-14. 3.Hepatic cirrhosis presumed secondary to NASH, with portal HTN, splenomegaly: she has not had liver decompensation with careful chemotherapy to date. Known to Dr Fuller Plan 4.chronic cough x years: markedly improved with interventions by Dr [name] shortly prior to start of chemotherapy. 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 Platelets still just over 100k today. 8. nonSTEMI 1012 9.restless legs: severe, very disruptive to sleep, worse with benadryl. She was able to sleep PTA with tramadol + ativan.  Appreciate all care by hospitalist team this admission.   Godfrey Pick, MD

## 2014-01-31 ENCOUNTER — Telehealth: Payer: Self-pay

## 2014-01-31 NOTE — Telephone Encounter (Signed)
-----   Message from Gordy Levan, MD sent at 01/30/2014 11:34 AM EST ----- DC from [facility] 12-20 after admission for febrile neutropenia. Sent home on levaquin x 5 days, which may be able to stop prior to 5 days if cultures result negative. Need to follow up urine and blood cultures from hospital until final  I asked pt/ Yvone Neu to call RN on 12-21 to let us know how she is. Last Hgb ~ 9. She looked generally weak/ tired as she was leaving hospital. If concerns, I can see her 12-22 with another CBC  thanks

## 2014-01-31 NOTE — Telephone Encounter (Signed)
[name] is tired but doing ok.  She is not sob at rest or with exertion. She just needs to recuperate.  Told her that the Urine culture was negative for infection. Will continue to follow up with Blood cultures.  No growth thus far.  Patient know to call the office if any concerns or issues prior to nest visit.

## 2014-02-02 ENCOUNTER — Encounter: Payer: Self-pay | Admitting: Oncology

## 2014-02-02 ENCOUNTER — Telehealth: Payer: Self-pay

## 2014-02-02 ENCOUNTER — Encounter: Payer: Self-pay | Admitting: *Deleted

## 2014-02-02 DIAGNOSIS — C569 Malignant neoplasm of unspecified ovary: Secondary | ICD-10-CM

## 2014-02-02 LAB — CULTURE, BLOOD (ROUTINE X 2)
Culture: NO GROWTH
Culture: NO GROWTH

## 2014-02-02 MED ORDER — TEMAZEPAM 15 MG PO CAPS: 15.0000 mg | ORAL_CAPSULE | Freq: Every evening | ORAL | Status: DC | PRN

## 2014-02-02 NOTE — Telephone Encounter (Signed)
Olivia Benson is having trouble sleeping with her legs being restless.  She was using the Ativan1 mg and Ultram 50 mg tabs qhs as recommended by Dr. Marko Plume.  This comvination ov medication has stopped woring in the las ~week. Told her not to take any sleep aide with Benadryl in it as this will make her restless legs worse. [name] verbalized understanding. Dr. Marko Plume will give her a prescription for Restoril.   Prescription called into to her pharmacy.

## 2014-02-02 NOTE — Progress Notes (Signed)
Olivia Benson, [id number], approved temazepam 25m from 02/02/14-02/03/15

## 2014-02-02 NOTE — Progress Notes (Signed)
RECEIVED A FAX FROM [facility] CONCERNING A PRIOR AUTHORIZATION FOR TEMAZEPAM. THIS REQUEST WAS PLACED IN THE MANAGED CARE BIN.

## 2014-02-05 ENCOUNTER — Other Ambulatory Visit: Payer: Self-pay | Admitting: Oncology

## 2014-02-07 ENCOUNTER — Encounter: Payer: Self-pay | Admitting: Oncology

## 2014-02-07 ENCOUNTER — Telehealth: Payer: Self-pay | Admitting: Oncology

## 2014-02-07 ENCOUNTER — Ambulatory Visit (HOSPITAL_BASED_OUTPATIENT_CLINIC_OR_DEPARTMENT_OTHER): Payer: Medicare Other | Admitting: Oncology

## 2014-02-07 ENCOUNTER — Other Ambulatory Visit (HOSPITAL_BASED_OUTPATIENT_CLINIC_OR_DEPARTMENT_OTHER): Payer: Medicare Other

## 2014-02-07 VITALS — BP 131/93 | HR 61 | Temp 98.0°F | Resp 18 | Ht 61.5 in | Wt 177.1 lb

## 2014-02-07 DIAGNOSIS — T451X5A Adverse effect of antineoplastic and immunosuppressive drugs, initial encounter: Secondary | ICD-10-CM

## 2014-02-07 DIAGNOSIS — C569 Malignant neoplasm of unspecified ovary: Secondary | ICD-10-CM

## 2014-02-07 DIAGNOSIS — K7581 Nonalcoholic steatohepatitis (NASH): Secondary | ICD-10-CM

## 2014-02-07 DIAGNOSIS — D6959 Other secondary thrombocytopenia: Secondary | ICD-10-CM

## 2014-02-07 DIAGNOSIS — D701 Agranulocytosis secondary to cancer chemotherapy: Secondary | ICD-10-CM

## 2014-02-07 DIAGNOSIS — G2581 Restless legs syndrome: Secondary | ICD-10-CM

## 2014-02-07 DIAGNOSIS — R180 Malignant ascites: Secondary | ICD-10-CM

## 2014-02-07 DIAGNOSIS — K7460 Unspecified cirrhosis of liver: Secondary | ICD-10-CM

## 2014-02-07 LAB — COMPREHENSIVE METABOLIC PANEL (CC13)
ALT: 32 U/L (ref 0–55)
AST: 30 U/L (ref 5–34)
Albumin: 3.7 g/dL (ref 3.5–5.0)
Alkaline Phosphatase: 122 U/L (ref 40–150)
Anion Gap: 8 mEq/L (ref 3–11)
BUN: 8.9 mg/dL (ref 7.0–26.0)
CO2: 26 mEq/L (ref 22–29)
Calcium: 8.8 mg/dL (ref 8.4–10.4)
Chloride: 108 mEq/L (ref 98–109)
Creatinine: 0.7 mg/dL (ref 0.6–1.1)
EGFR: 89 mL/min/{1.73_m2} — ABNORMAL LOW (ref >90)
Glucose: 94 mg/dl (ref 70–140)
Potassium: 3.6 mEq/L (ref 3.5–5.1)
Sodium: 142 mEq/L (ref 136–145)
Total Bilirubin: 0.47 mg/dL (ref 0.20–1.20)
Total Protein: 7.5 g/dL (ref 6.4–8.3)

## 2014-02-07 LAB — CBC WITH DIFFERENTIAL/PLATELET
BASO%: 0.8 % (ref 0.0–2.0)
Basophils Absolute: 0 10*3/uL (ref 0.0–0.1)
EOS%: 0.6 % (ref 0.0–7.0)
Eosinophils Absolute: 0 10*3/uL (ref 0.0–0.5)
HCT: 32.8 % — ABNORMAL LOW (ref 34.8–46.6)
HGB: 10.3 g/dL — ABNORMAL LOW (ref 11.6–15.9)
LYMPH%: 17.9 % (ref 14.0–49.7)
MCH: 29 pg (ref 25.1–34.0)
MCHC: 31.4 g/dL — ABNORMAL LOW (ref 31.5–36.0)
MCV: 92.4 fL (ref 79.5–101.0)
MONO#: 0.3 10*3/uL (ref 0.1–0.9)
MONO%: 5.6 % (ref 0.0–14.0)
NEUT#: 4.2 10*3/uL (ref 1.5–6.5)
NEUT%: 75.1 % (ref 38.4–76.8)
Platelets: 123 10*3/uL — ABNORMAL LOW (ref 145–400)
RBC: 3.54 10*6/uL — ABNORMAL LOW (ref 3.70–5.45)
RDW: 22 % — ABNORMAL HIGH (ref 11.2–14.5)
WBC: 5.6 10*3/uL (ref 3.9–10.3)
lymph#: 1 10*3/uL (ref 0.9–3.3)

## 2014-02-07 NOTE — Telephone Encounter (Signed)
, °

## 2014-02-07 NOTE — Progress Notes (Signed)
OFFICE PROGRESS NOTE   02/07/2014   Physicians:Emma Barrington Ellison, MD (PCP), Mardene Speak, M.Wert, P.Nahser, M.Stark/ J.Mann  INTERVAL HISTORY:  Patient is seen, together with significant other, in continuing attention to chemotherapy in process as initial intervention for advanced serous gyn carcinoma. She had cycle 3 carbo taxol on 01-18-14 with neulasta on day 6, then was admitted on day 10 with febrile neutropenia. She is scheduled for cycle 4 carbo taxol on 02-08-14, then will have restaging CT 02-23-14 prior to seeing Dr Denman George on 02-25-14.  Hospitalization for febrile neutropenia was 12-17 thru 01-30-14, with cultures negative. Stanley recovered by 12-18.   Patient has felt stronger since discharge home, tho is having more symptoms from chronic low back pain, possibly aggravated by putting away the Christmas decorations. As counts are good today, ok to use ibuprofen with food for next few days, as this is generally most helpful for her back pain. She has had no further fever and no symptoms of infection. Appetite is better and she has not had nausea.  Restoril begun 02-02-14 (after insurance approval and because she was not sleeping with ativan + tramadol) gave HA and "wild dreams" so has been DCd.   No PAC Flu vaccine done  genetics counseling scheduled 02-14-14  ONCOLOGIC HISTORY Patient initially had problems in ~ July 2015 with stress urinary incontinence related to severe chronic cough. Urine culture by PCP showed no infection and she was referred to Dr Clois Comber, who adjusted medications with great improvement in the cough. Despite improved cough, she continued with some urinary incontinence. She saw Dr Ronita Hipps in 10-2013, with pelvic mass on exam and CA 125 1195 on 11-08-13. She had CT AP 11-12-13, which showed large ascites, irregular and shrunken liver, mild splenomegaly, increased soft tissue in pelvis bilaterally and anterior mid and lower peritoneum, small left pleural effusion. She  saw Dr Denman George in consultation on 11-15-13, who agreed that this was likely gyn malignancy. She had US paracentesis of 4.1 liters on 11-18-13, with cytology ([id number]) and immunohistochemical stains consistent with serous carcinoma of Mullerian tract primary, favor ovarian. She had second paracentesis for 3.6 liters on 12-03-13. Gyn oncology recommended initial treatment with chemotherapy. She had first carboplatin and taxol on 12-07-13, with carboplatin dosed at AUC=4 and taxol at 135 mg/m2 due to concern for cirrhosis. She has needed gCSF support and even so was admitted with febrile neutropenia after cycle 3.   Review of systems as above, also: Cough again adequately controlled. No bleeding. No increased SOB. Bowels ok. Abdomen seems somewhat more full. Remainder of 10 point Review of Systems negative.  Objective:  Vital signs in last 24 hours:  BP 131/93 mmHg  Pulse 61  Temp(Src) 98 F (36.7 C) (Oral)  Resp 18  Ht 5' 1.5" (1.562 m)  Wt 177 lb 1.6 oz (80.332 kg)  BMI 32.93 kg/m2 Weight is up 2 lbs Alert, oriented and appropriate. Ambulatory without difficulty. Looks brighter overall Alopecia  HEENT:PERRL, sclerae not icteric. Oral mucosa moist without lesions, posterior pharynx clear.  Neck supple. No JVD.  Lymphatics:no cervical,supraclavicular or inguinal adenopathy Resp: clear to auscultation bilaterally and normal percussion bilaterally Cardio: regular rate and rhythm. No gallop. GI: abdomen full, soft, nontender, mildly distended especially in upper quadrants. Normally active bowel sounds. Surgical incision not remarkable. Musculoskeletal/ Extremities: without pitting edema, cords, tenderness Neuro: no increased peripheral neuropathy. Otherwise nonfocal. PSYCH appropriate mood and affect Skin without rash, ecchymosis, petechiae   Lab Results:  Results for orders placed or performed  in visit on 02/07/14  CBC with Differential  Result Value Ref Range   WBC 5.6 3.9 - 10.3  10e3/uL   NEUT# 4.2 1.5 - 6.5 10e3/uL   HGB 10.3 (L) 11.6 - 15.9 g/dL   HCT 32.8 (L) 34.8 - 46.6 %   Platelets 123 (L) 145 - 400 10e3/uL   MCV 92.4 79.5 - 101.0 fL   MCH 29.0 25.1 - 34.0 pg   MCHC 31.4 (L) 31.5 - 36.0 g/dL   RBC 3.54 (L) 3.70 - 5.45 10e6/uL   RDW 22.0 (H) 11.2 - 14.5 %   lymph# 1.0 0.9 - 3.3 10e3/uL   MONO# 0.3 0.1 - 0.9 10e3/uL   Eosinophils Absolute 0.0 0.0 - 0.5 10e3/uL   Basophils Absolute 0.0 0.0 - 0.1 10e3/uL   NEUT% 75.1 38.4 - 76.8 %   LYMPH% 17.9 14.0 - 49.7 %   MONO% 5.6 0.0 - 14.0 %   EOS% 0.6 0.0 - 7.0 %   BASO% 0.8 0.0 - 2.0 %  Comprehensive metabolic panel (Cmet) - [facility]  Result Value Ref Range   Sodium 142 136 - 145 mEq/L   Potassium 3.6 3.5 - 5.1 mEq/L   Chloride 108 98 - 109 mEq/L   CO2 26 22 - 29 mEq/L   Glucose 94 70 - 140 mg/dl   BUN 8.9 7.0 - 26.0 mg/dL   Creatinine 0.7 0.6 - 1.1 mg/dL   Total Bilirubin 0.47 0.20 - 1.20 mg/dL   Alkaline Phosphatase 122 40 - 150 U/L   AST 30 5 - 34 U/L   ALT 32 0 - 55 U/L   Total Protein 7.5 6.4 - 8.3 g/dL   Albumin 3.7 3.5 - 5.0 g/dL   Calcium 8.8 8.4 - 10.4 mg/dL   Anion Gap 8 3 - 11 mEq/L   EGFR 89 (L) >90 ml/min/1.73 m2    CA 125 available after visit 130, this having been 227 prior to cycle 3 and 762 on 12-27-13. All hospital cultures resulted normal  Studies/Results:  No results found. CT AP scheduled 02-23-14.  Medications: I have reviewed the patient's current medications. Stop Restoril  DISCUSSION: she agrees to having neulasta closer to treatment with upcoming cycle 4. With cycle 4 chemo on 02-08-14, will give neulasta on 02-12-14  Assessment/Plan: 1.high grade serous gyn carcinoma, clinically IIIC ovarian primary: Neoadjuvant chemotherapy with carboplatin and taxol begun 12-07-13, with neulasta. Ca 125 improving but has not normalized, ascites not accumulating at least as much. Cycle 4 01-29-14 with neulasta on 02-12-14. Repeat CT prior to seeing Dr Denman George in 2 weeks.  Genetics  referral. 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 Following liver function closely with chemo. Malignant ascites, other studies also sent with previous paracentesis as requested by Dr Fuller Plan. 3.admission for febrile neutropenia after cycle 3, despite neulasta. Will give neulasta closer to treatment with cycle 4. Doses already slightly reduced due to cirrhosis. 4.diabetes on oral agent. She is now checking blood sugars. 5.stress urinary incontinence likely related to pelvic mass, symptoms some better 6.chronic cough: better with medications by Dr [name]. Past tobacco 7. 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 No bleeding.  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 Will try to assist her in getting off of supplemental estrogen after rest of situation is more stable 11.narrow angle glaucoma: following with ophthalmology 12.restless legs: severe, interferes greatly with sleep, initially  better with ativan + tramadol, tho this no longer seems helpful. Intolerant to Restoril. May want to try gabapentin or elavil later, but will not add this just prior to chemo tomorrow.    Orders confirmed. She knows to call prior to next scheduled visit if needed. I will see her with labs 02-14-14. Time spent 25 min including >50% counseling and coordination of care.   Delisia Mcquiston P, MD   02/07/2014, 5:17 PM

## 2014-02-08 ENCOUNTER — Telehealth: Payer: Self-pay | Admitting: Oncology

## 2014-02-08 ENCOUNTER — Ambulatory Visit (HOSPITAL_BASED_OUTPATIENT_CLINIC_OR_DEPARTMENT_OTHER): Payer: Medicare Other

## 2014-02-08 DIAGNOSIS — C569 Malignant neoplasm of unspecified ovary: Secondary | ICD-10-CM

## 2014-02-08 DIAGNOSIS — Z5111 Encounter for antineoplastic chemotherapy: Secondary | ICD-10-CM

## 2014-02-08 LAB — CA 125: CA 125: 130 U/mL — ABNORMAL HIGH (ref <35)

## 2014-02-08 MED ORDER — FAMOTIDINE IN NACL 20-0.9 MG/50ML-% IV SOLN
INTRAVENOUS | Status: AC
  Filled 2014-02-08: qty 50

## 2014-02-08 MED ORDER — LORAZEPAM 1 MG PO TABS
ORAL_TABLET | ORAL | Status: AC
  Filled 2014-02-08: qty 1

## 2014-02-08 MED ORDER — DEXAMETHASONE SODIUM PHOSPHATE 20 MG/5ML IJ SOLN
20.0000 mg | Freq: Once | INTRAMUSCULAR | Status: AC
  Administered 2014-02-08: 20 mg via INTRAVENOUS

## 2014-02-08 MED ORDER — PACLITAXEL CHEMO INJECTION 300 MG/50ML
135.0000 mg/m2 | Freq: Once | INTRAVENOUS | Status: AC
  Administered 2014-02-08: 258 mg via INTRAVENOUS
  Filled 2014-02-08: qty 43

## 2014-02-08 MED ORDER — SODIUM CHLORIDE 0.9 % IV SOLN
Freq: Once | INTRAVENOUS | Status: AC
  Administered 2014-02-08: 09:00:00 via INTRAVENOUS

## 2014-02-08 MED ORDER — DIPHENHYDRAMINE HCL 50 MG/ML IJ SOLN
INTRAMUSCULAR | Status: AC
  Filled 2014-02-08: qty 1

## 2014-02-08 MED ORDER — SODIUM CHLORIDE 0.9 % IV SOLN
389.6000 mg | Freq: Once | INTRAVENOUS | Status: AC
  Administered 2014-02-08: 390 mg via INTRAVENOUS
  Filled 2014-02-08: qty 39

## 2014-02-08 MED ORDER — FAMOTIDINE IN NACL 20-0.9 MG/50ML-% IV SOLN
20.0000 mg | Freq: Once | INTRAVENOUS | Status: AC
  Administered 2014-02-08: 20 mg via INTRAVENOUS

## 2014-02-08 MED ORDER — ONDANSETRON 16 MG/50ML IVPB (CHCC)
INTRAVENOUS | Status: AC
  Filled 2014-02-08: qty 16

## 2014-02-08 MED ORDER — LORAZEPAM 1 MG PO TABS
1.0000 mg | ORAL_TABLET | Freq: Once | ORAL | Status: AC | PRN
  Administered 2014-02-08: 1 mg via ORAL

## 2014-02-08 MED ORDER — ONDANSETRON 16 MG/50ML IVPB (CHCC)
16.0000 mg | Freq: Once | INTRAVENOUS | Status: AC
  Administered 2014-02-08: 16 mg via INTRAVENOUS

## 2014-02-08 MED ORDER — DEXAMETHASONE SODIUM PHOSPHATE 20 MG/5ML IJ SOLN
INTRAMUSCULAR | Status: AC
  Filled 2014-02-08: qty 5

## 2014-02-08 MED ORDER — DIPHENHYDRAMINE HCL 50 MG/ML IJ SOLN
25.0000 mg | Freq: Once | INTRAMUSCULAR | Status: AC
  Administered 2014-02-08: 25 mg via INTRAVENOUS

## 2014-02-08 NOTE — Telephone Encounter (Signed)
, °

## 2014-02-08 NOTE — Patient Instructions (Signed)
[facility] Discharge Instructions for Patients Receiving Chemotherapy  Today you received the following chemotherapy agents Taxol, Carbo  To help prevent nausea and vomiting after your treatment, we encourage you to take your nausea medication as directed/prescribed.   If you develop nausea and vomiting that is not controlled by your nausea medication, call the clinic.   BELOW ARE SYMPTOMS THAT SHOULD BE REPORTED IMMEDIATELY:  *FEVER GREATER THAN 100.5 F  *CHILLS WITH OR WITHOUT FEVER  NAUSEA AND VOMITING THAT IS NOT CONTROLLED WITH YOUR NAUSEA MEDICATION  *UNUSUAL SHORTNESS OF BREATH  *UNUSUAL BRUISING OR BLEEDING  TENDERNESS IN MOUTH AND THROAT WITH OR WITHOUT PRESENCE OF ULCERS  *URINARY PROBLEMS  *BOWEL PROBLEMS  UNUSUAL RASH Items with * indicate a potential emergency and should be followed up as soon as possible.  Feel free to call the clinic you have any questions or concerns. The clinic phone number is [phone].

## 2014-02-10 ENCOUNTER — Other Ambulatory Visit: Payer: Self-pay

## 2014-02-10 DIAGNOSIS — C569 Malignant neoplasm of unspecified ovary: Secondary | ICD-10-CM

## 2014-02-12 ENCOUNTER — Ambulatory Visit (HOSPITAL_BASED_OUTPATIENT_CLINIC_OR_DEPARTMENT_OTHER): Payer: Medicare Other

## 2014-02-12 ENCOUNTER — Other Ambulatory Visit: Payer: Self-pay | Admitting: Oncology

## 2014-02-12 VITALS — BP 147/73 | HR 72 | Temp 97.6°F

## 2014-02-12 DIAGNOSIS — C569 Malignant neoplasm of unspecified ovary: Secondary | ICD-10-CM

## 2014-02-12 MED ORDER — PEGFILGRASTIM INJECTION 6 MG/0.6ML ~~LOC~~
6.0000 mg | PREFILLED_SYRINGE | Freq: Once | SUBCUTANEOUS | Status: AC
  Administered 2014-02-12: 6 mg via SUBCUTANEOUS

## 2014-02-14 ENCOUNTER — Encounter: Payer: Self-pay | Admitting: Oncology

## 2014-02-14 ENCOUNTER — Encounter: Payer: Self-pay | Admitting: Genetic Counselor

## 2014-02-14 ENCOUNTER — Ambulatory Visit: Payer: Medicare Other

## 2014-02-14 ENCOUNTER — Telehealth: Payer: Self-pay | Admitting: Oncology

## 2014-02-14 ENCOUNTER — Ambulatory Visit: Payer: Medicare Other | Admitting: Genetic Counselor

## 2014-02-14 ENCOUNTER — Other Ambulatory Visit (HOSPITAL_BASED_OUTPATIENT_CLINIC_OR_DEPARTMENT_OTHER): Payer: Medicare Other

## 2014-02-14 ENCOUNTER — Ambulatory Visit (HOSPITAL_BASED_OUTPATIENT_CLINIC_OR_DEPARTMENT_OTHER): Payer: Medicare Other | Admitting: Oncology

## 2014-02-14 ENCOUNTER — Other Ambulatory Visit: Payer: Medicare Other

## 2014-02-14 VITALS — BP 138/65 | HR 79 | Temp 98.1°F | Resp 18 | Ht 61.5 in | Wt 173.6 lb

## 2014-02-14 DIAGNOSIS — R053 Chronic cough: Secondary | ICD-10-CM

## 2014-02-14 DIAGNOSIS — C569 Malignant neoplasm of unspecified ovary: Secondary | ICD-10-CM

## 2014-02-14 DIAGNOSIS — Z803 Family history of malignant neoplasm of breast: Secondary | ICD-10-CM

## 2014-02-14 DIAGNOSIS — R05 Cough: Secondary | ICD-10-CM

## 2014-02-14 DIAGNOSIS — D701 Agranulocytosis secondary to cancer chemotherapy: Secondary | ICD-10-CM

## 2014-02-14 DIAGNOSIS — Z8052 Family history of malignant neoplasm of bladder: Secondary | ICD-10-CM

## 2014-02-14 DIAGNOSIS — R180 Malignant ascites: Secondary | ICD-10-CM

## 2014-02-14 DIAGNOSIS — K7460 Unspecified cirrhosis of liver: Secondary | ICD-10-CM

## 2014-02-14 DIAGNOSIS — G2581 Restless legs syndrome: Secondary | ICD-10-CM

## 2014-02-14 DIAGNOSIS — T451X5A Adverse effect of antineoplastic and immunosuppressive drugs, initial encounter: Secondary | ICD-10-CM

## 2014-02-14 DIAGNOSIS — D6959 Other secondary thrombocytopenia: Secondary | ICD-10-CM

## 2014-02-14 DIAGNOSIS — K7581 Nonalcoholic steatohepatitis (NASH): Secondary | ICD-10-CM

## 2014-02-14 LAB — CBC WITH DIFFERENTIAL/PLATELET
BASO%: 0.6 % (ref 0.0–2.0)
Basophils Absolute: 0 10*3/uL (ref 0.0–0.1)
EOS%: 1.3 % (ref 0.0–7.0)
Eosinophils Absolute: 0 10*3/uL (ref 0.0–0.5)
HCT: 32.8 % — ABNORMAL LOW (ref 34.8–46.6)
HGB: 10.7 g/dL — ABNORMAL LOW (ref 11.6–15.9)
LYMPH%: 19.9 % (ref 14.0–49.7)
MCH: 30.1 pg (ref 25.1–34.0)
MCHC: 32.6 g/dL (ref 31.5–36.0)
MCV: 92.1 fL (ref 79.5–101.0)
MONO#: 0.1 10*3/uL (ref 0.1–0.9)
MONO%: 1.6 % (ref 0.0–14.0)
NEUT#: 2.4 10*3/uL (ref 1.5–6.5)
NEUT%: 76.6 % (ref 38.4–76.8)
Platelets: 81 10*3/uL — ABNORMAL LOW (ref 145–400)
RBC: 3.56 10*6/uL — ABNORMAL LOW (ref 3.70–5.45)
RDW: 18.8 % — ABNORMAL HIGH (ref 11.2–14.5)
WBC: 3.1 10*3/uL — ABNORMAL LOW (ref 3.9–10.3)
lymph#: 0.6 10*3/uL — ABNORMAL LOW (ref 0.9–3.3)

## 2014-02-14 LAB — COMPREHENSIVE METABOLIC PANEL (CC13)
ALT: 60 U/L — ABNORMAL HIGH (ref 0–55)
AST: 47 U/L — ABNORMAL HIGH (ref 5–34)
Albumin: 3.8 g/dL (ref 3.5–5.0)
Alkaline Phosphatase: 121 U/L (ref 40–150)
Anion Gap: 8 mEq/L (ref 3–11)
BUN: 13.7 mg/dL (ref 7.0–26.0)
CO2: 25 mEq/L (ref 22–29)
Calcium: 8.9 mg/dL (ref 8.4–10.4)
Chloride: 105 mEq/L (ref 98–109)
Creatinine: 0.8 mg/dL (ref 0.6–1.1)
EGFR: 80 mL/min/{1.73_m2} — ABNORMAL LOW (ref >90)
Glucose: 170 mg/dl — ABNORMAL HIGH (ref 70–140)
Potassium: 4.5 mEq/L (ref 3.5–5.1)
Sodium: 138 mEq/L (ref 136–145)
Total Bilirubin: 1.02 mg/dL (ref 0.20–1.20)
Total Protein: 7.6 g/dL (ref 6.4–8.3)

## 2014-02-14 NOTE — Progress Notes (Signed)
REFERRING PROVIDER: Helane Rima, MD [address], [place] [id number]  Evlyn Clines, MD  Everitt Amber, MD  PRIMARY PROVIDER:  Helane Rima, MD  PRIMARY REASON FOR VISIT:  1. Ovarian cancer, unspecified laterality   2. Family history of breast cancer   3. Family history of bladder cancer      HISTORY OF PRESENT ILLNESS:   [name], a 69 y.o. female, was seen for a [place] cancer genetics consultation at the request of Dr. Marko Plume due to a personal and family history of cancer.  [name] presents to clinic today to discuss the possibility of a hereditary predisposition to cancer, genetic testing, and to further clarify her future cancer risks, as well as potential cancer risks for family members.   CANCER HISTORY:   No history exists.     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 This was treated with chemotherapy.  [name] is not aware whether other family members have had genetic testing.    HORMONAL RISK FACTORS:  Menarche was at age 89.  First live birth at age 75.  OCP use for approximately 5 years.  Ovaries intact: yes.  Hysterectomy: yes.  Menopausal status: postmenopausal.  HRT use: 1 and 2 years. Colonoscopy: yes; normal. Mammogram within the last year: yes. Number of breast biopsies: 0. Up to date with pelvic exams:  yes. Any excessive radiation exposure in the past:  no  Past Medical History  Diagnosis Date  . Diabetes mellitus   . Asthma   . Overactive bladder   . Non-ST elevated myocardial infarction (non-STEMI) Sept 2012    Mild elevation in troponin and CK MB but no evidence of coronary disease at time of cath  . Elevated LFTs   . Obesity   . Hypertension   . Cirrhosis   . Portal hypertension   . Glaucoma, narrow-angle   . Ovarian cancer 11/22/2013    Disseminated ovarian cancer    Past Surgical History  Procedure Laterality  Date  . Tonsillectomy  1971  . Abdominal ultrasound  Sept 2012    No gallbladder disease, + fatty liver  . Cardiac catheterization  10/25/2010    EF 60%; Normal coronaries  . Colonoscopy  10/18/2005  . Wh-mammography  11/24/2009  . Partial hysterectomy  1988  . Cosmetic surgery  2003    History   Social History  . Marital Status: Single    Spouse Name: N/A    Number of Children: 1 D  . Years of Education: N/A   Occupational History  . Retired    Social History Main Topics  . Smoking status: Former Smoker -- 1.00 packs/day for 1 years    Types: Cigarettes    Quit date: 02/11/1982  . Smokeless tobacco: Current User  . Alcohol Use: Yes     Comment: socially  . Drug Use: No  . Sexual Activity: None   Other Topics Concern  . None   Social History Narrative     FAMILY HISTORY:  We obtained a detailed, 4-generation family history.  Significant diagnoses are listed below: Family History  Problem Relation Age of Onset  . Asthma Mother   . Diabetes Mother   . Hypertension Mother   . Allergies Mother   . Rheum arthritis Mother   . Bladder Cancer Father 77    former heavy smoker  . Hypertension Father   . Stroke  Father   . Breast cancer Sister 82  . Diabetes Brother   . Allergies Brother   . Asthma Brother   . Diabetes Sister     x 3  . Allergies Sister   . Asthma Sister     x 2   . Breast cancer Maternal Aunt 85  . Breast cancer Cousin 75    (maternal) bilateral cancer  . Aneurysm Son 12   Ms. [name] has four sisters and one brother.  One sister was diagnosed with breast cancer at 108.  Her mother is still living and cancer free. [name] had one maternal uncle and four maternal aunts.  The uncle died in infancy.  One aunt currently has breast cancer, and another aunt has a daughter who had bilateral breast cancer at 88.  Ms. Leda Min father had bladder cancer, after smoking for years.  He had multiple siblings that she does not know much information  about. Patient's maternal ancestors are of Caucasian descent, and paternal ancestors are of [place] descent. There is no reported Ashkenazi Jewish ancestry. There is no known consanguinity.  GENETIC COUNSELING ASSESSMENT: TYISHA CRESSY is a 69 y.o. female with a personal history of ovarian cancer and family history of breast and bladder cancer which somewhat suggestive of a hereditary cancer syndrome and predisposition to cancer. We, therefore, discussed and recommended the following at today's visit.   DISCUSSION: We reviewed the characteristics, features and inheritance patterns of hereditary cancer syndromes. We discussed that the most common cause of hereditary ovarian cancer, especially in light of a family history of breast cancer, is the result of BRCA mutations.  We reviewed that the second most common cause of ovarian cancer would be the result of Lynch syndrome.  We also discussed genetic testing, including the appropriate family members to test, the process of testing, insurance coverage and turn-around-time for results. We discussed the implications of a negative, positive and/or variant of uncertain significant result. We recommended Ms. [name] pursue genetic testing for the OvaNext gene panel.   PLAN: After considering the risks, benefits, and limitations,Ms. [name]  provided informed consent to pursue genetic testing and the blood sample was sent to [facility] for analysis of the OvaNext panel test.  The OvaNext panel test performs sequencing and deletion/duplication testing of the following 24 genes:  ATM, BARD1, BRCA1, BRCA2, BRIP1, CDH1, CHEK2, EPCAM, MLH1, MRE11A, MSH2, MSH6, MUTYH, NBN, NF1, PALB2, PMS2, PTEN, RAD50, RAD51C, RAD51D, SMARCA4, STK11, and TP53. Results should be available within approximately 3-4 weeks' time, at which point they will be disclosed by telephone to Ms. [name], as will any additional recommendations warranted by these results. [name] will  receive a summary of her genetic counseling visit and a copy of her results once available. This information will also be available in Epic. We encouraged Ms. [name] to remain in contact with cancer genetics annually so that we can continuously update the family history and inform her of any changes in cancer genetics and testing that may be of benefit for her family. Ms. Leda Min questions were answered to her satisfaction today. Our contact information was provided should additional questions or concerns arise.  Lastly, we encouraged Ms. [name] to remain in contact with cancer genetics annually so that we can continuously update the family history and inform her of any changes in cancer genetics and testing that may be of benefit for this family.   Ms.  Leda Min questions were answered to her satisfaction today. Our contact information  was provided should additional questions or concerns arise. Thank you for the referral and allowing [place] to share in the care of your patient.   Brelyn Woehl P. Florene Glen, [place], [facility] Certified Genetic Counselor [email or web address] phone: [phone]  The patient was seen for a total of 60 minutes in face-to-face genetic counseling.  This patient was discussed with Drs. Magrinat, Lindi Adie and/or Burr Medico who agrees with the above.    _______________________________________________________________________ For Office Staff:  Number of people involved in session: 1 Was an Intern/ student involved with case: no

## 2014-02-14 NOTE — Progress Notes (Signed)
OFFICE PROGRESS NOTE   02/14/2014   Physicians:Emma Barrington Ellison, MD (PCP), Mardene Speak, M.Wert, P.Nahser, M.Stark/ J.Mann  INTERVAL HISTORY:  Patient is seen, alone for visit, in continuing attention to neoadjuvant chemotherapy in process for advanced serous gyn carcinoma. She was admitted with febrile neutropenia after cycle 3 despite neulasta. She had cycle 4 carboplatin taxol on 02-08-14 with neulasta on 02-12-14, which was one day earlier than with cycle 3, and is not yet neutropenic.  Patient tolerated most recent chemotherapy better overall, with aches from taxol and neulasta more bearable. She still had significant nausea, no vomiting, and has made herself eat and drink fluids. She has not checked blood sugars as requested, has not felt hypoglycemic. She used only a couple of doses of ibuprofen with improvement in chronic low back pain. Bowels are moving. She slept better last 2 nights, less restless legs.  She is for restaging CTs on 02-23-14 and to see Dr Denman George on 02-25-14. She understands that she may need additional chemo, tho it is possible that she may go to surgery as next step. Peripheral IV access will not be adequate for additional chemo. If she needs only <= ~ 2 more cycles prior to surgery could consider PICC, tho likely PAC will be most useful, as she will probably need additional treatment after surgery also. She does not like idea of PAC but probably would agree to that.    No PAC Flu vaccine done Genetics counseling scheduled for later today.   ONCOLOGIC HISTORY Patient initially had problems in ~ July 2015 with stress urinary incontinence related to severe chronic cough. Urine culture by PCP showed no infection and she was referred to Dr Clois Comber, who adjusted medications with great improvement in the cough. Despite improved cough, she continued with some urinary incontinence. She saw Dr Ronita Hipps in 10-2013, with pelvic mass on exam and CA 125 1195 on 11-08-13. She had  CT AP 11-12-13, which showed large ascites, irregular and shrunken liver, mild splenomegaly, increased soft tissue in pelvis bilaterally and anterior mid and lower peritoneum, small left pleural effusion. She saw Dr Denman George in consultation on 11-15-13, who agreed that this was likely gyn malignancy. She had US paracentesis of 4.1 liters on 11-18-13, with cytology ([id number]) and immunohistochemical stains consistent with serous carcinoma of Mullerian tract primary, favor ovarian. She had second paracentesis for 3.6 liters on 12-03-13. Gyn oncology recommended initial treatment with chemotherapy. She had first carboplatin and taxol on 12-07-13, with carboplatin dosed at AUC=4 and taxol at 135 mg/m2 due to concern for cirrhosis. She has needed gCSF support and even so was admitted with febrile neutropenia after cycle 3. CA125 was 1938 on 12-01-13 as baseline for chemotherapy, down to 130 by day prior to cycle 4.   Review of systems as above, also: Bowels moving. Cough at baseline. Abdomen feels less full. No fever or symptoms of infection. No bleeding. Remainder of 10 point Review of Systems negative.  Objective:  Vital signs in last 24 hours:  BP 138/65 mmHg  Pulse 79  Temp(Src) 98.1 F (36.7 C) (Oral)  Resp 18  Ht 5' 1.5" (1.562 m)  Wt 173 lb 9.6 oz (78.744 kg)  BMI 32.27 kg/m2 Weight down 3.5 lbs. Alert, oriented and appropriate. Ambulatory without difficulty.  Alopecia  HEENT:PERRL, sclerae not icteric. Oral mucosa moist without lesions, posterior pharynx clear.  Neck supple. No JVD.  Lymphatics:no cervical,supraclavicular, axillary or inguinal adenopathy Resp: clear to auscultation bilaterally and normal percussion bilaterally Cardio: regular rate and  rhythm. No gallop. GI: less full in upper quadrants, soft, nontender  Few  bowel sounds. Surgical incision not remarkable. Musculoskeletal/ Extremities: without pitting edema, cords, tenderness Neuro: no change peripheral neuropathy left  foot. Otherwise nonfocal. PSYCH appropriate mood and affect Skin without rash or petechiae, ecchymoses forearms at sites of attempted IV access    Lab Results:  Results for orders placed or performed in visit on 02/14/14  CBC with Differential  Result Value Ref Range   WBC 3.1 (L) 3.9 - 10.3 10e3/uL   NEUT# 2.4 1.5 - 6.5 10e3/uL   HGB 10.7 (L) 11.6 - 15.9 g/dL   HCT 32.8 (L) 34.8 - 46.6 %   Platelets 81 (L) 145 - 400 10e3/uL   MCV 92.1 79.5 - 101.0 fL   MCH 30.1 25.1 - 34.0 pg   MCHC 32.6 31.5 - 36.0 g/dL   RBC 3.56 (L) 3.70 - 5.45 10e6/uL   RDW 18.8 (H) 11.2 - 14.5 %   lymph# 0.6 (L) 0.9 - 3.3 10e3/uL   MONO# 0.1 0.1 - 0.9 10e3/uL   Eosinophils Absolute 0.0 0.0 - 0.5 10e3/uL   Basophils Absolute 0.0 0.0 - 0.1 10e3/uL   NEUT% 76.6 38.4 - 76.8 %   LYMPH% 19.9 14.0 - 49.7 %   MONO% 1.6 0.0 - 14.0 %   EOS% 1.3 0.0 - 7.0 %   BASO% 0.6 0.0 - 2.0 %   CMET available after visit : glucose 170, AST 47, ALT 60, AP 121, Tbili 1.02  CA125 down to 130 on 02-07-14, from [place] on 12-01-13 just prior to start of chemo.  Studies/Results:  No results found.  CTs pending 02-23-14 Medications: I have reviewed the patient's current medications.  DISCUSSION: will recheck CBC on day of scans 02-23-14, in follow up of WBC and platelets. She will call prior if fever or symptoms of infection or bleeding. PAC (vs PICC) depending on restaging CTs and recommendations from Dr Denman George re surgery/ further chemotherapy.  Assessment/Plan: 1.high grade serous gyn carcinoma, clinically IIIC ovarian primary: Neoadjuvant chemotherapy with carboplatin and taxol begun 12-07-13 and cycle 4 01-29-14, with neulasta. Ca 125 improving but has not normalized, ascites not accumulating. Repeat CT prior to seeing Dr Denman George . Genetics counseling planned. 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 Following liver function closely with chemo. Malignant ascites, other studies  also sent with previous paracentesis as requested by Dr Fuller Plan. 3.admission for febrile neutropenia after cycle 3, despite neulasta. Neulasta given closer to treatment with cycle 4. Chemo doses already slightly reduced due to cirrhosis. Recheck CBC with CT 02-23-14. 4.diabetes on oral agent. A1c likely higher now due to steroids around each chemo. 5.stress urinary incontinence likely related to pelvic mass, symptoms some better 6.chronic cough: better with medications by Dr [name]. Past tobacco 7. 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 No bleeding. Recheck CBC 02-23-14. 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 Will try to assist her in getting off of supplemental estrogen after rest of situation is more stable 11.narrow angle glaucoma: following with ophthalmology 12.restless legs: severe, interferes greatly with sleep, some better again with ativan + tramadol. Intolerant to Restoril.  13.peripheral IV access not adequate for further chemo. Plan as above   Patient knows to call prior to next scheduled visit if needed.  Time spent 25 min including >50% counseling and coordination of care.   Clemmie Marxen P,  MD   02/14/2014, 10:17 AM

## 2014-02-14 NOTE — Telephone Encounter (Signed)
, °

## 2014-02-21 ENCOUNTER — Telehealth: Payer: Self-pay

## 2014-02-21 ENCOUNTER — Ambulatory Visit (HOSPITAL_BASED_OUTPATIENT_CLINIC_OR_DEPARTMENT_OTHER): Payer: Medicare Other | Admitting: Nurse Practitioner

## 2014-02-21 ENCOUNTER — Ambulatory Visit (HOSPITAL_BASED_OUTPATIENT_CLINIC_OR_DEPARTMENT_OTHER): Payer: Medicare Other

## 2014-02-21 VITALS — BP 120/65 | HR 68 | Temp 97.8°F | Resp 17 | Ht 61.5 in | Wt 175.2 lb

## 2014-02-21 DIAGNOSIS — L0390 Cellulitis, unspecified: Secondary | ICD-10-CM | POA: Insufficient documentation

## 2014-02-21 DIAGNOSIS — C561 Malignant neoplasm of right ovary: Secondary | ICD-10-CM

## 2014-02-21 DIAGNOSIS — L03113 Cellulitis of right upper limb: Secondary | ICD-10-CM

## 2014-02-21 DIAGNOSIS — C569 Malignant neoplasm of unspecified ovary: Secondary | ICD-10-CM

## 2014-02-21 LAB — CBC WITH DIFFERENTIAL/PLATELET
BASO%: 0.9 % (ref 0.0–2.0)
Basophils Absolute: 0.1 10*3/uL (ref 0.0–0.1)
EOS%: 0.3 % (ref 0.0–7.0)
Eosinophils Absolute: 0 10*3/uL (ref 0.0–0.5)
HCT: 33.6 % — ABNORMAL LOW (ref 34.8–46.6)
HGB: 10.7 g/dL — ABNORMAL LOW (ref 11.6–15.9)
LYMPH%: 9.9 % — ABNORMAL LOW (ref 14.0–49.7)
MCH: 29.3 pg (ref 25.1–34.0)
MCHC: 31.7 g/dL (ref 31.5–36.0)
MCV: 92.7 fL (ref 79.5–101.0)
MONO#: 0.5 10*3/uL (ref 0.1–0.9)
MONO%: 4.8 % (ref 0.0–14.0)
NEUT#: 8 10*3/uL — ABNORMAL HIGH (ref 1.5–6.5)
NEUT%: 84.1 % — ABNORMAL HIGH (ref 38.4–76.8)
Platelets: 114 10*3/uL — ABNORMAL LOW (ref 145–400)
RBC: 3.63 10*6/uL — ABNORMAL LOW (ref 3.70–5.45)
RDW: 20.6 % — ABNORMAL HIGH (ref 11.2–14.5)
WBC: 9.5 10*3/uL (ref 3.9–10.3)
lymph#: 0.9 10*3/uL (ref 0.9–3.3)

## 2014-02-21 MED ORDER — SULFAMETHOXAZOLE-TRIMETHOPRIM 800-160 MG PO TABS: 1.0000 | ORAL_TABLET | Freq: Two times a day (BID) | ORAL | Status: DC

## 2014-02-21 MED ORDER — HYDROCODONE-ACETAMINOPHEN 5-325 MG PO TABS: 1.0000 | ORAL_TABLET | Freq: Four times a day (QID) | ORAL | Status: DC | PRN

## 2014-02-21 NOTE — Telephone Encounter (Signed)
[name] called stating that her right forearm is red, swollen , and warm to the touch between her wrist. and elbow. This is the arm she had the IV in for her treatment  On 02-09-15. Patient states that she is afebrile.  She applied warm compresses yesterday with little effect. [name] will come in to [facility] and see Cyndy Bacon at 1430.

## 2014-02-22 ENCOUNTER — Encounter: Payer: Self-pay | Admitting: Nurse Practitioner

## 2014-02-22 ENCOUNTER — Telehealth: Payer: Self-pay | Admitting: *Deleted

## 2014-02-22 ENCOUNTER — Telehealth: Payer: Self-pay | Admitting: Nurse Practitioner

## 2014-02-22 NOTE — Assessment & Plan Note (Signed)
Patient received cycle 4 of her neoadjuvant group plan/Taxol on 02/08/2014.  She is scheduled for restaging whole-body CT on 02/23/2014.  She is scheduled for labs and follow up with gynecology here at the [place] on 02/25/2014.  She is scheduled for labs and follow up with Dr. Marko Plume on 03/03/2014.

## 2014-02-22 NOTE — Progress Notes (Signed)
will   SY[facility]TOM [facility]NA[facility]EMENT [facility]   HPI: Olivia Benson 69 y.o. female diagnosed with ovarian cancer.  Currently undergoing neoadjuvant carboplatin/Taxol chemotherapy therapy regimen.  Patient called the cancer Center today requesting urgent care visit.  She received cycle 4 of her neoadjuvant carboplatin/Taxol chemotherapy on 02/08/2014.  She just noticed within the past 24 hours new onset of right forearm erythema, edema, and tenderness.  Patient reports that this is the site of her most recent peripheral IV chemotherapy.  She denies any recent fevers or chills.   HPI  CURRENT THERAPY: Upcoming Treatment Dates - OVARIAN Carboplatin / Paclitaxel q21d Days with orders from any treatment category:  No upcoming days in selected categories.    ROS  Past Medical History  Diagnosis Date  . Diabetes mellitus   . Asthma   . Overactive bladder   . Non-ST elevated myocardial infarction (non-STEMI) Sept 2012    Mild elevation in troponin and CK MB but no evidence of coronary disease at time of cath  . Elevated LFTs   . Obesity   . Hypertension   . Cirrhosis   . Portal hypertension   . Glaucoma, narrow-angle   . Ovarian cancer 11/22/2013    Disseminated ovarian cancer    Past Surgical History  Procedure Laterality Date  . Tonsillectomy  1971  . Abdominal ultrasound  Sept 2012    No gallbladder disease, + fatty liver  . Cardiac catheterization  10/25/2010    EF 60%; Normal coronaries  . Colonoscopy  10/18/2005  . Wh-mammography  11/24/2009  . Partial hysterectomy  1988  . Cosmetic surgery  2003    has S/P cardiac cath; Fatty liver disease, nonalcoholic; Dyspnea; Cough variant asthma vs UACS; Essential hypertension; Ascites; Elevated cancer antigen 125 (CA-125); Disseminated ovarian cancer; Diabetes mellitus; Liver cirrhosis secondary to NASH; Malignant ascites; Chemotherapy induced neutropenia; Chemotherapy induced thrombocytopenia; Restless legs syndrome; Neutropenic fever;  Pancytopenia due to chemotherapy; Fever and neutropenia; Asthma; and Cellulitis on her problem list.     is allergic to benadryl; shellfish allergy; and penicillins.    Medication List       This list is accurate as of: 02/21/14 11:59 PM.  Always use your most recent med list.               albuterol 108 (90 BASE) MCG/ACT inhaler  Commonly known as:  PROVENTIL HFA;VENTOLIN HFA  Inhale 2 puffs into the lungs every 4 (four) hours as needed.     aspirin 81 MG tablet  Take 81 mg by mouth daily.     bisoprolol 5 MG tablet  Commonly known as:  ZEBETA  Take 1/2 tablet daily     dexamethasone 4 MG tablet  Commonly known as:  DECADRON  Take 5 tabs with food(=69m) 12 hrs6 hrs prior to Taxol chemotherapy     estradiol 1 MG tablet  Commonly known as:  ESTRACE  Take 1 mg by mouth at bedtime.     famotidine 20 MG tablet  Commonly known as:  PEPCID  Take 20 mg by mouth at bedtime as needed for heartburn (cough).     HYDROcodone-acetaminophen 5-325 MG per tablet  Commonly known as:  NORCO/VICODIN  Take 1-2 tablets by mouth every 6 (six) hours as needed for moderate pain.     ibuprofen 200 MG tablet  Commonly known as:  ADVIL,MOTRIN  Take 800 mg by mouth every 6 (six) hours as needed for moderate pain (pain).     loratadine 10 MG tablet  Commonly known as:  CLARITIN  Take 10 mg by mouth daily.     LORazepam 1 MG tablet  Commonly known as:  ATIVAN  Place 1 tablet under the tongue or swallow every 6 hrs as needed for nausea or as directed     metFORMIN 500 MG 24 hr tablet  Commonly known as:  GLUCOPHAGE-XR  Take 500 mg by mouth daily.     MICROLET LANCETS Misc  Test 1  times daily. Dx 250.02     mometasone-formoterol 100-5 MCG/ACT Aero  Commonly known as:  DULERA  Inhale 2 puffs into the lungs as needed for wheezing or shortness of breath.     montelukast 10 MG tablet  Commonly known as:  SINGULAIR  Take 10 mg by mouth at bedtime.     ondansetron 8 MG tablet    Commonly known as:  ZOFRAN  Take 1 tablet (8 mg total) by mouth every 8 (eight) hours as needed for nausea or vomiting.     pantoprazole 40 MG tablet  Commonly known as:  PROTONIX  Take 40 mg by mouth daily.     prochlorperazine 10 MG tablet  Commonly known as:  COMPAZINE  Take 1 tablet (10 mg total) by mouth every 6 (six) hours as needed for nausea or vomiting.     sennosides-docusate sodium 8.6-50 MG tablet  Commonly known as:  SENOKOT-S  Take 1 tablet by mouth daily as needed for constipation (constipation).     sulfamethoxazole-trimethoprim 800-160 MG per tablet  Commonly known as:  BACTRIM DS,SEPTRA DS  Take 1 tablet by mouth 2 (two) times daily.     temazepam 15 MG capsule  Commonly known as:  RESTORIL  Take 1 capsule (15 mg total) by mouth at bedtime as needed for sleep.     traMADol 50 MG tablet  Commonly known as:  ULTRAM  Take 1 tablet (50 mg total) by mouth every 8 (eight) hours as needed for moderate pain.         PHYSICAL EXAMINATION  Blood pressure 120/65, pulse 68, temperature 97.8 F (36.6 C), temperature source Oral, resp. rate 17, height 5' 1.5" (1.562 m), weight 175 lb 3.2 oz (79.47 kg), SpO2 99 %.  Physical Exam  Constitutional: She is oriented to person, place, and time and well-developed, well-nourished, and in no distress.  HENT:  Head: Normocephalic and atraumatic.  Eyes: Conjunctivae and EOM are normal. Pupils are equal, round, and reactive to light. Right eye exhibits no discharge. Left eye exhibits no discharge. No scleral icterus.  Neck: Normal range of motion. Neck supple.  Pulmonary/Chest: Effort normal. No respiratory distress.  Musculoskeletal: Normal range of motion. She exhibits edema and tenderness.  Right forearm with erythema, edema, and tenderness with palpation.  All pulses are palpable in all extremities are warm.  Phlebitis/cellulitis area marked with permanent marker.  Neurological: She is alert and oriented to person, place, and  time. Gait normal.  Skin: Skin is warm and dry. There is erythema.  Please see previous note regarding right forearm phlebitis/cellulitis.  Psychiatric: Affect normal.  Nursing note and vitals reviewed.   LABORATORY DATA:. Appointment on 02/21/2014  Component Date Value Ref Range Status  . WBC 02/21/2014 9.5  3.9 - 10.3 10e3/uL Final  . NEUT# 02/21/2014 8.0* 1.5 - 6.5 10e3/uL Final  . HGB 02/21/2014 10.7* 11.6 - 15.9 g/dL Final  . HCT 02/21/2014 33.6* 34.8 - 46.6 % Final  . Platelets 02/21/2014 114* 145 - 400 10e3/uL Final  . MCV  02/21/2014 92.7  79.5 - 101.0 fL Final  . MCH 02/21/2014 29.3  25.1 - 34.0 pg Final  . MCHC 02/21/2014 31.7  31.5 - 36.0 g/dL Final  . RBC 02/21/2014 3.63* 3.70 - 5.45 10e6/uL Final  . RDW 02/21/2014 20.6* 11.2 - 14.5 % Final  . lymph# 02/21/2014 0.9  0.9 - 3.3 10e3/uL Final  . MONO# 02/21/2014 0.5  0.1 - 0.9 10e3/uL Final  . Eosinophils Absolute 02/21/2014 0.0  0.0 - 0.5 10e3/uL Final  . Basophils Absolute 02/21/2014 0.1  0.0 - 0.1 10e3/uL Final  . NEUT% 02/21/2014 84.1* 38.4 - 76.8 % Final  . LYMPH% 02/21/2014 9.9* 14.0 - 49.7 % Final  . MONO% 02/21/2014 4.8  0.0 - 14.0 % Final  . EOS% 02/21/2014 0.3  0.0 - 7.0 % Final  . BASO% 02/21/2014 0.9  0.0 - 2.0 % Final     RADIOGRAPHIC STUDIES: No results found.  ASSESSMENT/PLAN:    Cellulitis Patient received cycle 4 of her neoadjuvant carboplatin/Taxol peripherally to right forearm on 02/08/2014.  She has developed right forearm erythema, edema, and tenderness surrounding the IV insertion site.  Most likely this is a phlebitis; but could also have elements of cellulitis to the site.  Confirmed review patient's blood counts today; and platelet count is 114.  Advised patient to take Motrin a few times throughout the day; and to use warm compresses to site.  Also, she may elevate her arm above the level of her heart.  Since patient is allergic to penicillin-will order Bactrim twice daily for any cellulitic  component as well.  Advised patient will call/follow-up within 24 hours to confirm that site is improving.  Discussed at length the need for a probable Port-A-Cath placement in the future for further planned chemotherapy.  Disseminated ovarian cancer Patient received cycle 4 of her neoadjuvant group plan/Taxol on 02/08/2014.  She is scheduled for restaging whole-body CT on 02/23/2014.  She is scheduled for labs and follow up with gynecology here at the [facility] on 02/25/2014.  She is scheduled for labs and follow up with Dr. Marko Plume on 03/03/2014.  Patient stated understanding of all instructions; and was in agreement with this plan of care. The patient knows to call the clinic with any problems, questions or concerns.   This was a shared visit with Dr. Marko Plume today.  Total time spent with patient was 25 minutes;  with greater than 75 percent of that time spent in face to face counseling regarding her symptoms, and coordination of care and follow up.  Disclaimer: This note was dictated with voice recognition software. Similar sounding words can inadvertently be transcribed and may not be corrected upon review.   Drue Second, NP 02/22/2014   Patient seen and examined, discussed plan of care with APP. Agree with above note.  Godfrey Pick, MD

## 2014-02-22 NOTE — Assessment & Plan Note (Addendum)
Patient received cycle 4 of her neoadjuvant carboplatin/Taxol peripherally to right forearm on 02/08/2014.  She has developed right forearm erythema, edema, and tenderness surrounding the IV insertion site.  Most likely this is a phlebitis; but could also have elements of cellulitis to the site.  Confirmed review patient's blood counts today; and platelet count is 114.  Advised patient to take Motrin a few times throughout the day; and to use warm compresses to site.  Also, she may elevate her arm above the level of her heart.  Since patient is allergic to penicillin-will order Bactrim twice daily for any cellulitic component as well.  Advised patient will call/follow-up within 24 hours to confirm that site is improving.  Discussed at length the need for a probable Port-A-Cath placement in the future for further planned chemotherapy.

## 2014-02-22 NOTE — Telephone Encounter (Signed)
Called to check on patient today - she states she picked up the antibiotic and started it last night. Per patient, her arm feels much better and isn't as swollen as it was yesterday. Per Dr. Marko Plume, labs cancelled for 02-23-14 and patient notified of this. Confirmed arrival time for scheduled CT and patient's appt with Dr. Denman George this Friday.

## 2014-02-22 NOTE — Telephone Encounter (Signed)
-----   Message from Gordy Levan, MD sent at 02/21/2014  6:17 PM EST ----- Saw [name] on Mon 1-11 with painful, erythematous right forearm, likely cellulitis. No fever. [name] was to put her on antibiotic.  RN please check on her 1-12 AM and let me know how she is. Pager [phone]  thanks

## 2014-02-22 NOTE — Telephone Encounter (Signed)
Called patient to follow-up regarding her right forearm bite/cellulitis.  Patient initiated the Bactrim antibiotic yesterday.  She also took some ibuprofen last night as well.  She was given hydrocodone to try for the pain associated with her arm.  Patient reports that her arm has less erythema, edema, and pain today.  Patient states that she has remained afebrile.  Patient states that taking the hydrocodone last night did help with the pain; and she was able to sleep throughout the night.  Advised patient to call/return if she develops any new or worsening symptoms whatsoever.

## 2014-02-23 ENCOUNTER — Encounter (HOSPITAL_COMMUNITY): Payer: Self-pay

## 2014-02-23 ENCOUNTER — Ambulatory Visit (HOSPITAL_COMMUNITY)
Admission: RE | Admit: 2014-02-23 | Discharge: 2014-02-23 | Disposition: A | Discharge status: Home / Self Care | Payer: Medicare Other | Source: Ambulatory Visit | Attending: Oncology | Admitting: Oncology

## 2014-02-23 ENCOUNTER — Other Ambulatory Visit: Payer: Medicare Other

## 2014-02-23 DIAGNOSIS — C561 Malignant neoplasm of right ovary: Secondary | ICD-10-CM | POA: Diagnosis present

## 2014-02-23 MED ORDER — IOHEXOL 300 MG/ML  SOLN
100.0000 mL | Freq: Once | INTRAMUSCULAR | Status: AC | PRN
  Administered 2014-02-23: 100 mL via INTRAVENOUS

## 2014-02-23 MED ORDER — IOHEXOL 300 MG/ML  SOLN
50.0000 mL | Freq: Once | INTRAMUSCULAR | Status: AC | PRN
  Administered 2014-02-23: 50 mL via ORAL

## 2014-02-25 ENCOUNTER — Telehealth: Payer: Self-pay | Admitting: Oncology

## 2014-02-25 ENCOUNTER — Encounter: Payer: Self-pay | Admitting: Gynecologic Oncology

## 2014-02-25 ENCOUNTER — Other Ambulatory Visit (HOSPITAL_BASED_OUTPATIENT_CLINIC_OR_DEPARTMENT_OTHER): Payer: Medicare Other

## 2014-02-25 ENCOUNTER — Encounter: Payer: Self-pay | Admitting: Oncology

## 2014-02-25 ENCOUNTER — Ambulatory Visit (HOSPITAL_BASED_OUTPATIENT_CLINIC_OR_DEPARTMENT_OTHER): Payer: Medicare Other | Admitting: Oncology

## 2014-02-25 ENCOUNTER — Ambulatory Visit: Payer: Medicare Other | Attending: Gynecologic Oncology | Admitting: Gynecologic Oncology

## 2014-02-25 ENCOUNTER — Encounter: Payer: Self-pay | Admitting: Gastroenterology

## 2014-02-25 ENCOUNTER — Other Ambulatory Visit: Payer: Self-pay | Admitting: Oncology

## 2014-02-25 VITALS — BP 141/67 | HR 66 | Temp 98.4°F | Resp 16

## 2014-02-25 DIAGNOSIS — Z5111 Encounter for antineoplastic chemotherapy: Secondary | ICD-10-CM | POA: Diagnosis not present

## 2014-02-25 DIAGNOSIS — R188 Other ascites: Secondary | ICD-10-CM | POA: Diagnosis not present

## 2014-02-25 DIAGNOSIS — I252 Old myocardial infarction: Secondary | ICD-10-CM | POA: Insufficient documentation

## 2014-02-25 DIAGNOSIS — K7460 Unspecified cirrhosis of liver: Secondary | ICD-10-CM | POA: Insufficient documentation

## 2014-02-25 DIAGNOSIS — R180 Malignant ascites: Secondary | ICD-10-CM

## 2014-02-25 DIAGNOSIS — Z79899 Other long term (current) drug therapy: Secondary | ICD-10-CM | POA: Insufficient documentation

## 2014-02-25 DIAGNOSIS — Z91013 Allergy to seafood: Secondary | ICD-10-CM | POA: Diagnosis not present

## 2014-02-25 DIAGNOSIS — Z87891 Personal history of nicotine dependence: Secondary | ICD-10-CM | POA: Insufficient documentation

## 2014-02-25 DIAGNOSIS — E669 Obesity, unspecified: Secondary | ICD-10-CM | POA: Insufficient documentation

## 2014-02-25 DIAGNOSIS — I8500 Esophageal varices without bleeding: Secondary | ICD-10-CM | POA: Diagnosis not present

## 2014-02-25 DIAGNOSIS — C569 Malignant neoplasm of unspecified ovary: Secondary | ICD-10-CM | POA: Diagnosis present

## 2014-02-25 DIAGNOSIS — E119 Type 2 diabetes mellitus without complications: Secondary | ICD-10-CM

## 2014-02-25 DIAGNOSIS — Z880 Allergy status to penicillin: Secondary | ICD-10-CM | POA: Insufficient documentation

## 2014-02-25 DIAGNOSIS — J45909 Unspecified asthma, uncomplicated: Secondary | ICD-10-CM | POA: Insufficient documentation

## 2014-02-25 DIAGNOSIS — I808 Phlebitis and thrombophlebitis of other sites: Secondary | ICD-10-CM

## 2014-02-25 DIAGNOSIS — Z888 Allergy status to other drugs, medicaments and biological substances status: Secondary | ICD-10-CM | POA: Diagnosis not present

## 2014-02-25 DIAGNOSIS — R161 Splenomegaly, not elsewhere classified: Secondary | ICD-10-CM | POA: Insufficient documentation

## 2014-02-25 DIAGNOSIS — L03113 Cellulitis of right upper limb: Secondary | ICD-10-CM

## 2014-02-25 DIAGNOSIS — N3281 Overactive bladder: Secondary | ICD-10-CM | POA: Diagnosis not present

## 2014-02-25 DIAGNOSIS — D6959 Other secondary thrombocytopenia: Secondary | ICD-10-CM

## 2014-02-25 DIAGNOSIS — K766 Portal hypertension: Secondary | ICD-10-CM | POA: Diagnosis not present

## 2014-02-25 DIAGNOSIS — H409 Unspecified glaucoma: Secondary | ICD-10-CM | POA: Diagnosis not present

## 2014-02-25 DIAGNOSIS — C561 Malignant neoplasm of right ovary: Secondary | ICD-10-CM

## 2014-02-25 DIAGNOSIS — K7581 Nonalcoholic steatohepatitis (NASH): Secondary | ICD-10-CM

## 2014-02-25 DIAGNOSIS — E1169 Type 2 diabetes mellitus with other specified complication: Secondary | ICD-10-CM

## 2014-02-25 DIAGNOSIS — T451X5A Adverse effect of antineoplastic and immunosuppressive drugs, initial encounter: Secondary | ICD-10-CM

## 2014-02-25 DIAGNOSIS — R05 Cough: Secondary | ICD-10-CM

## 2014-02-25 DIAGNOSIS — Z90710 Acquired absence of both cervix and uterus: Secondary | ICD-10-CM | POA: Diagnosis not present

## 2014-02-25 DIAGNOSIS — T451X5D Adverse effect of antineoplastic and immunosuppressive drugs, subsequent encounter: Secondary | ICD-10-CM

## 2014-02-25 LAB — CBC WITH DIFFERENTIAL/PLATELET
BASO%: 0.2 % (ref 0.0–2.0)
Basophils Absolute: 0 10*3/uL (ref 0.0–0.1)
EOS%: 0.2 % (ref 0.0–7.0)
Eosinophils Absolute: 0 10*3/uL (ref 0.0–0.5)
HCT: 30.1 % — ABNORMAL LOW (ref 34.8–46.6)
HGB: 9.8 g/dL — ABNORMAL LOW (ref 11.6–15.9)
LYMPH%: 17.8 % (ref 14.0–49.7)
MCH: 30.2 pg (ref 25.1–34.0)
MCHC: 32.6 g/dL (ref 31.5–36.0)
MCV: 92.6 fL (ref 79.5–101.0)
MONO#: 0.3 10*3/uL (ref 0.1–0.9)
MONO%: 6.6 % (ref 0.0–14.0)
NEUT#: 3.3 10*3/uL (ref 1.5–6.5)
NEUT%: 75.2 % (ref 38.4–76.8)
Platelets: 110 10*3/uL — ABNORMAL LOW (ref 145–400)
RBC: 3.25 10*6/uL — ABNORMAL LOW (ref 3.70–5.45)
RDW: 18.9 % — ABNORMAL HIGH (ref 11.2–14.5)
WBC: 4.4 10*3/uL (ref 3.9–10.3)
lymph#: 0.8 10*3/uL — ABNORMAL LOW (ref 0.9–3.3)

## 2014-02-25 MED ORDER — CLINDAMYCIN HCL 300 MG PO CAPS: 300.0000 mg | ORAL_CAPSULE | Freq: Four times a day (QID) | ORAL | Status: DC

## 2014-02-25 NOTE — Telephone Encounter (Signed)
Anne sch-gave pt copy of sch

## 2014-02-25 NOTE — Patient Instructions (Signed)
Preparing for your Surgery  Plan for surgery on Feb 9 with Dr. Denman George.  Pre-operative Testing -You will receive a phone call from presurgical testing at [facility] to arrange for a pre-operative testing appointment before your surgery.  This appointment normally occurs one to two weeks before your scheduled surgery.   -Bring your insurance card, copy of an advanced directive if applicable, medication list  -At that visit, you will be asked to sign a consent for a possible blood transfusion in case a transfusion becomes necessary during surgery.  The need for a blood transfusion is rare but having consent is a necessary part of your care.     -You should not be taking blood thinners or aspirin at least ten days prior to surgery unless instructed by your surgeon.  Day Before Surgery at [place] will be asked to take in only clear liquids the day before surgery.  Examples of clear liquids include broths, jello, and clear juices.  You will be advised to have nothing to eat or drink after midnight the evening before.  Drink one bottle of Magnesium Citrate the afternoon before surgery (Feb 8).    Your role in recovery Your role is to become active as soon as directed by your doctor, while still giving yourself time to heal.  Rest when you feel tired. You will be asked to do the following in order to speed your recovery:  - Cough and breathe deeply. This helps toclear and expand your lungs and can prevent pneumonia. You may be given a spirometer to practice deep breathing. A staff member will show you how to use the spirometer. - Do mild physical activity. Walking or moving your legs help your circulation and body functions return to normal. A staff member will help you when you try to walk and will provide you with simple exercises. Do not try to get up or walk alone the first time. - Actively manage your pain. Managing your pain lets you move in comfort. We will ask you to  rate your pain on a scale of zero to 10. It is your responsibility to tell your doctor or nurse where and how much you hurt so your pain can be treated.  Special Considerations -If you are diabetic, you may be placed on insulin after surgery to have closer control over your blood sugars to promote healing and recovery.  This does not mean that you will be discharged on insulin.  If applicable, your oral antidiabetics will be resumed when you are tolerating a solid diet.  -Your final pathology results from surgery should be available by the Friday after surgery and the results will be relayed to you when available.  Blood Transfusion Information WHAT IS A BLOOD TRANSFUSION? A transfusion is the replacement of blood or some of its parts. Blood is made up of multiple cells which provide different functions.  Red blood cells carry oxygen and are used for blood loss replacement.  White blood cells fight against infection.  Platelets control bleeding.  Plasma helps clot blood.  Other blood products are available for specialized needs, such as hemophilia or other clotting disorders. BEFORE THE TRANSFUSION  Who gives blood for transfusions?   You may be able to donate blood to be used at a later date on yourself (autologous donation).  Relatives can be asked to donate blood. This is generally not any safer than if you have received blood from a stranger. The same precautions are taken to ensure  safety when a relative's blood is donated.  Healthy volunteers who are fully evaluated to make sure their blood is safe. This is blood bank blood. Transfusion therapy is the safest it has ever been in the practice of medicine. Before blood is taken from a donor, a complete history is taken to make sure that person has no history of diseases nor engages in risky social behavior (examples are intravenous drug use or sexual activity with multiple partners). The donor's travel history is screened to minimize  risk of transmitting infections, such as malaria. The donated blood is tested for signs of infectious diseases, such as HIV and hepatitis. The blood is then tested to be sure it is compatible with you in order to minimize the chance of a transfusion reaction. If you or a relative donates blood, this is often done in anticipation of surgery and is not appropriate for emergency situations. It takes many days to process the donated blood. RISKS AND COMPLICATIONS Although transfusion therapy is very safe and saves many lives, the main dangers of transfusion include:   Getting an infectious disease.  Developing a transfusion reaction. This is an allergic reaction to something in the blood you were given. Every precaution is taken to prevent this. The decision to have a blood transfusion has been considered carefully by your caregiver before blood is given. Blood is not given unless the benefits outweigh the risks.

## 2014-02-25 NOTE — Patient Instructions (Signed)
Warm soaks to arm every 30 min while awake all of today and continue thru weekend if making progress  Stop bactrim. Start Clindamycin 300 mg every 6 hrs as soon as you get prescription today.   OK to take ibuprofen with food every 6-8 hrs today and thru weekend.  If fever, or if arm is worse in next 24 hours or if arm is not better by Nancy Fetter, will need to be seen over weekend as you will likely need IV antibiotics.  Dr Marko Plume will see you on Mon 1-18   Check blood sugars this weekend, as they may be higher with this phlebitis

## 2014-02-25 NOTE — Progress Notes (Signed)
Followup Note: Gyn-Onc  Consult was initially requested by Dr. Ronita Hipps for the evaluation of Olivia Benson("Olivia Benson"), a 69 y.o. female with ascites, elevated CA 125, and pelvic peritoneal thickening  CC: Dr Brien Few Chief Complaint  Patient presents with  . Ovarian Cancer    Assessment/Plan:  [name] Olivia Benson") is a 69 y.o.  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  She has undergone neoadjuvant chemotherapy (4 cycles the last of which was on 02/08/2014) and has demonstrated an excellent clinical response to this. I am recommending an interval BSO, omentectomy, debulking procedure.  To optimize timing and minimize delays in therapy I'm recommending she stay on track with chemotherapy and receive cycle 5 next week on 03/01/2014 as planned. We will then perform the debulking surgery 3 weeks post chemotherapy optimize the chance of normal white count and platelets. Postoperatively I would recommend an additional 3 cycles of consolidation chemotherapy.  I discussed with Olivia Benson that I believe that a expiratory laparotomy would be the best approach given the persistence of disease on CT that I am seeing. I have concerns about laparoscopic entry given her hepatosplenomegaly associated with her cirrhosis. I discussed that her cirrhosis puts her at increased risk for perioperative morbidity and mortality. I discussed operative risks including  bleeding, infection, damage to internal organs (such as bladder,ureters, bowels), blood clot, reoperation and rehospitalization. She desires that we pursue surgery. We will ensure that she has optimal reflexes for VTE event perioperatively as she is an increased risk with advanced ovarian cancer.  I think she may benefit from placement of a venous port due to her limited venous access and development of superficial thrombophlebitis.  HPI: Olivia Benson is a 69 year old G1P1 who was seen in consultation at the request of Dr Ronita Hipps in  October 2015 for the findings of a pelvic mass on examination, CT findings consistent with ovarian cancer and an elevated CA 125. Olivia Benson had approximately 1 year history of cough, for which she underwent workup with Dr [name], a pulmonologist at [facility]. With extensive evaluation and medication modification, she feels "a million percent better". However, she continued to have stress urinary incontinence ( a symptom she has had for approximately 2 years). She had initially felt the SUI was associated with the cough, however, when she noticed that it did not improve with resolution of her cough, she sought further evaluation. She also reports difficulty emptying her bladder of urine (feels that after she goes she has to sit and wait a while before she can completely empty). She also notes mild pulling discomfort in the suprapubic area for 2 months.  In September 2015 she was seen by her OBGYN Dr Ronita Hipps for the persistent SUI symptoms. He performed a pelvic examination which revealed a pelvic mass. He ordered a CA 125: 1195 on 11/08/13; and a CT of the abdomen and pelvis on 11/12/13 which showed:  - A large amount of ascites present.  - Findings of progressive hepatic cirrhosis with a large amount of ascites and mild splenomegaly. There are venous collaterals present.The liver is shrunken and its surface is markedly irregular with mild splenomegaly. There prominence of mesenteric venous structures.   - The urinary bladder is partially distended and grossly normal.  - Increased peritoneal soft tissue density within the pelvis fairly symmetric from right to left. This could reflect mesenteric implants in the appropriate clinical setting. There are similar findings noted along the anterior mid and lower peritoneal surface on  the left.  - No definite focal adnexal masses are demonstrated.  - There is a small left pleural effusion layering posteriorly.     The patient has had a hysterectomy for benign indications in  1988 (TAH). She has a sister, a maternal aunt and a maternal cousin with breast cancer (none have been tested for BRCA).  Of note, in 2013 she had undergone a CT to work up abnormal LFT's. This showed an enlarged irregularly contoured liver and splenomegaly consistent with cirrhosis. A cirrhosis workup was unremarkable and her LFT's trended down, and therefore no further action was taken. She denies alcoholism, and rarely drinks.  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  She went on to receive 4 cycles of neoadjuvant chemotherapy with paclitaxel and carboplatin q 21 days. She has tolerated therapy very well with no major dose limiting toxicities or treatment delays. Her most recent CA 125 on day 1 of cycle 4 was 130. She had required paracenteses x 3 during her first cycles of therapy to control ascites symptoms.  CT after cycle 4 (02/23/14):  1. Interval decrease in ascites. 2. Morphologic changes in the liver consistent with cirrhosis. 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 Portal vein remains patent at this time. 3. Persistent but decreased abnormal soft tissue attenuation tracking in the omental fat. This may be secondary to interval improvement in metastatic disease. 4. Peritoneal thickening seen in the left abdomen and pelvis on the previous study has decreased and nearly resolved in the interval. This also suggests interval improvement in metastatic disease.  Interval History: The patient reports improved appetite,and diet since starting chemotherapy.  Current Meds:  Outpatient Encounter Prescriptions as of 02/25/2014  Medication Sig  . albuterol (PROVENTIL HFA;VENTOLIN HFA) 108 (90 BASE) MCG/ACT inhaler Inhale 2 puffs into the lungs every 4 (four) hours as needed.    Marland Kitchen aspirin 81 MG tablet Take 81 mg by mouth daily.    .  bisoprolol (ZEBETA) 5 MG tablet Take 1/2 tablet daily  . dexamethasone (DECADRON) 4 MG tablet Take 5 tabs with food(=42m) 12 hrs6 hrs prior to Taxol chemotherapy  . estradiol (ESTRACE) 1 MG tablet Take 1 mg by mouth at bedtime.   . famotidine (PEPCID) 20 MG tablet Take 20 mg by mouth at bedtime as needed for heartburn (cough).   .Marland KitchenHYDROcodone-acetaminophen (NORCO/VICODIN) 5-325 MG per tablet Take 1-2 tablets by mouth every 6 (six) hours as needed for moderate pain.  .Marland Kitchenibuprofen (ADVIL,MOTRIN) 200 MG tablet Take 800 mg by mouth every 6 (six) hours as needed for moderate pain (pain).  .Marland Kitchenloratadine (CLARITIN) 10 MG tablet Take 10 mg by mouth daily.   .Marland KitchenLORazepam (ATIVAN) 1 MG tablet Place 1 tablet under the tongue or swallow every 6 hrs as needed for nausea or as directed  . metFORMIN (GLUCOPHAGE-XR) 500 MG 24 hr tablet Take 500 mg by mouth daily.  .Marland KitchenMICROLET LANCETS MISC Test 1  times daily. Dx 250.02  . mometasone-formoterol (DULERA) 100-5 MCG/ACT AERO Inhale 2 puffs into the lungs as needed for wheezing or shortness of breath.   . montelukast (SINGULAIR) 10 MG tablet Take 10 mg by mouth at bedtime.   . ondansetron (ZOFRAN) 8 MG tablet Take 1 tablet (8 mg total) by mouth every 8 (eight) hours as needed for nausea or vomiting.  . pantoprazole (PROTONIX) 40 MG tablet Take 40 mg by mouth daily.    .Marland Kitchen  prochlorperazine (COMPAZINE) 10 MG tablet Take 1 tablet (10 mg total) by mouth every 6 (six) hours as needed for nausea or vomiting.  . sennosides-docusate sodium (SENOKOT-S) 8.6-50 MG tablet Take 1 tablet by mouth daily as needed for constipation (constipation).   Marland Kitchen sulfamethoxazole-trimethoprim (BACTRIM DS,SEPTRA DS) 800-160 MG per tablet Take 1 tablet by mouth 2 (two) times daily.  . temazepam (RESTORIL) 15 MG capsule Take 1 capsule (15 mg total) by mouth at bedtime as needed for sleep.  . traMADol (ULTRAM) 50 MG tablet Take 1 tablet (50 mg total) by mouth every 8 (eight) hours as needed for moderate  pain.    Allergy:  Allergies  Allergen Reactions  . Benadryl [Diphenhydramine Hcl] Other (See Comments)    Restless legs   . Shellfish Allergy     HER THROAT SWELLS UP AND CLOSES  . Penicillins Rash    WHEN SHE WAS TEENAGER    Social Hx:   History   Social History  . Marital Status: Single    Spouse Name: N/A    Number of Children: 1 D  . Years of Education: N/A   Occupational History  . Retired    Social History Main Topics  . Smoking status: Former Smoker -- 1.00 packs/day for 1 years    Types: Cigarettes    Quit date: 02/11/1982  . Smokeless tobacco: Current User  . Alcohol Use: Yes     Comment: socially  . Drug Use: No  . Sexual Activity: Not on file   Other Topics Concern  . Not on file   Social History Narrative  former smoker (quit in the 15's) Used to work teaching bar tending. Remote history of heavier drinking but "never a problem". She now drinks once every few months.  Past Surgical Hx:  Past Surgical History  Procedure Laterality Date  . Tonsillectomy  1971  . Abdominal ultrasound  Sept 2012    No gallbladder disease, + fatty liver  . Cardiac catheterization  10/25/2010    EF 60%; Normal coronaries  . Colonoscopy  10/18/2005  . Wh-mammography  11/24/2009  . Partial hysterectomy  1988  . Cosmetic surgery  2003    Past Medical Hx:  Past Medical History  Diagnosis Date  . Diabetes mellitus   . Asthma   . Overactive bladder   . Non-ST elevated myocardial infarction (non-STEMI) Sept 2012    Mild elevation in troponin and CK MB but no evidence of coronary disease at time of cath  . Elevated LFTs   . Obesity   . Hypertension   . Cirrhosis   . Portal hypertension   . Glaucoma, narrow-angle   . Ovarian cancer 11/22/2013    Disseminated ovarian cancer    Past Gynecological History:  Hysterectomy for heavy periods (benign) in 1988.  No LMP recorded. Patient has had a hysterectomy.  Family Hx:  Family History  Problem Relation Age of  Onset  . Asthma Mother   . Diabetes Mother   . Hypertension Mother   . Allergies Mother   . Rheum arthritis Mother   . Bladder Cancer Father 58    former heavy smoker  . Hypertension Father   . Stroke Father   . Breast cancer Sister 26  . Diabetes Brother   . Allergies Brother   . Asthma Brother   . Diabetes Sister     x 3  . Allergies Sister   . Asthma Sister     x 2   . Breast cancer  Maternal Aunt 85  . Breast cancer Cousin 54    (maternal) bilateral cancer  . Aneurysm Son 36    Review of Systems:  Constitutional  Feels well,  See HPI  ENT Normal appearing ears and nares bilaterally Skin/Breast  No rash, sores, jaundice, itching, dryness Cardiovascular  No chest pain, shortness of breath, or edema  Pulmonary  No cough or wheeze.  Gastro Intestinal  No nausea, vomitting, or diarrhoea. No bright red blood per rectum, no abdominal pain, change in bowel movement, or constipation.  Genito Urinary  No frequency, urgency, dysuria, + stress incontinence. see HPI Musculo Skeletal  + erythema and pain in distal lateral right forearm consistent with thrombophlebitis at a chemotherapy access site Neurologic  No weakness, numbness, change in gait,  Psychology  No depression, anxiety, insomnia.   Vitals:  Blood pressure 141/67, pulse 66, temperature 98.4 F (36.9 C), temperature source Oral, resp. rate 16.  Physical Exam: WD in NAD Neck  Supple NROM, without any enlargements.  Lymph Node Survey No cervical supraclavicular or inguinal adenopathy Cardiovascular  Pulse normal rate, regularity and rhythm. S1 and S2 normal.  Lungs  Clear to auscultation bilateraly, without wheezes/crackles/rhonchi. Good air movement.  Skin  Erythema and edema on distal right arm  Psychiatry  Alert and oriented to person, place, and time  Abdomen  Normoactive bowel sounds, abdomen soft, non-tender and overweight without evidence of hernia. No dullness to percussion. Back No CVA  tenderness Genito Urinary  Vulva/vagina: Normal external female genitalia.  No lesions. No discharge or bleeding.  Bladder/urethra:  No lesions or masses, well supported bladder  Vagina: normal in appearance  Cervix: surgically absent  Uterus: surgically absent   Adnexa: no fullness or masses appreciated. Rectal  The previously appreciated substantial thickening and nodularity of the rectovaginal septum/cul de sac concerning for peritoneal metastases has resolved and is nonpalpable. Extremities  No bilateral cyanosis, clubbing or edema.   Donaciano Eva, MD   02/25/2014, 5:11 PM

## 2014-02-25 NOTE — Progress Notes (Signed)
OFFICE PROGRESS NOTE     Physicians:Emma Barrington Ellison, MD (PCP), Luvenia Starch.Wert, P.Nahser, M.Stark/ J.Mann  INTERVAL HISTORY:   Patient is seen as work in visit today, due to continued problems with thrombophlebitis at site of last chemotherapy right forearm. She had #4 carbo taxol on 02-08-14, then contacted this office to report redness and swelling of right forearm on 02-21-13. She was seen by APP that day, this MD also involved in that visit; she was begun on Bactrim DC which she has continued, day 5 today. She has had no fever or chills. The diffuse swelling and erythema that had involved most of forearm is more consolidated to track along vein now, however this still aches and is very uncomfortable. She has used warm soaks only at night. She has taken a couple of doses of ibuprofen.  She had neulasta on 02-12-14. Repeat CBC today without neutropenia and platelets just over 100k.  She is also to be seen by Dr Denman George today, having had restaging CT AP on 02-23-14 after 4 cycles of neoadjuvant chemotherapy for advanced gyn carcinoma.  She has been reluctant thus far to have PAC, but will need central line for further chemotherapy. Genetics testing sent 02-14-14 Flu vaccine done  ONCOLOGIC HISTORY Patient initially had problems in ~ July 2015 with stress urinary incontinence related to severe chronic cough. Urine culture by PCP showed no infection and she was referred to Dr Clois Comber, who adjusted medications with great improvement in the cough. Despite improved cough, she continued with some urinary incontinence. She saw Dr Ronita Hipps in 10-2013, with pelvic mass on exam and CA 125 1195 on 11-08-13. She had CT AP 11-12-13, which showed large ascites, irregular and shrunken liver, mild splenomegaly, increased soft tissue in pelvis bilaterally and anterior mid and lower peritoneum, small left pleural effusion. She saw Dr Denman George in consultation on 11-15-13, who agreed that this was likely gyn  malignancy. She had US paracentesis of 4.1 liters on 11-18-13, with cytology ([id number]) and immunohistochemical stains consistent with serous carcinoma of Mullerian tract primary, favor ovarian. She had second paracentesis for 3.6 liters on 12-03-13. Gyn oncology recommended initial treatment with chemotherapy. She had first carboplatin and taxol on 12-07-13, with carboplatin dosed at AUC=4 and taxol at 135 mg/m2 due to concern for cirrhosis. She has needed gCSF support and even so was admitted with febrile neutropenia after cycle 3. CA125 was 1938 on 12-01-13 as baseline for chemotherapy, down to 130 by day prior to cycle 4.  Review of systems as above, also: No other symptoms of infection. No bleeding. Thinks she would be feeling fairly well if it were not for the arm. Has been working this week. Remainder of 10 point Review of Systems negative.  Objective:  Vital signs in last 24 hours: Weight 175, up 2 lbs. Temp 98.4. Resp 16, HR 66 regular, BP 141/67  Alert, oriented and appropriate. Ambulatory without difficulty. Looks uncomfortable from the arm, but not in any acute distress. Neatly groomed, respirations not labored Alopecia  HEENT:PERRL, sclerae not icteric. Oral mucosa moist without lesions, posterior pharynx clear.   Lymphatics:no suraclavicular, axillary adenopathy Resp: clear to auscultation bilaterally  Cardio: regular rate and rhythm. RIght forearm with tender, erythematous cords ~ 4.5 cm and ~ 3 cm from mid forearm to elbow, with site of IV access just at distal end of the lower cord. Less swelling and no diffuse erythema of surrounding tissues today. No swelling hand, peripheral pulses good, no streaking above elbow.  Lab Results:  Results for orders placed or performed in visit on 02/25/14  CBC with Differential  Result Value Ref Range   WBC 4.4 3.9 - 10.3 10e3/uL   NEUT# 3.3 1.5 - 6.5 10e3/uL   HGB 9.8 (L) 11.6 - 15.9 g/dL   HCT 30.1 (L) 34.8 - 46.6 %   Platelets  110 (L) 145 - 400 10e3/uL   MCV 92.6 79.5 - 101.0 fL   MCH 30.2 25.1 - 34.0 pg   MCHC 32.6 31.5 - 36.0 g/dL   RBC 3.25 (L) 3.70 - 5.45 10e6/uL   RDW 18.9 (H) 11.2 - 14.5 %   lymph# 0.8 (L) 0.9 - 3.3 10e3/uL   MONO# 0.3 0.1 - 0.9 10e3/uL   Eosinophils Absolute 0.0 0.0 - 0.5 10e3/uL   Basophils Absolute 0.0 0.0 - 0.1 10e3/uL   NEUT% 75.2 38.4 - 76.8 %   LYMPH% 17.8 14.0 - 49.7 %   MONO% 6.6 0.0 - 14.0 %   EOS% 0.2 0.0 - 7.0 %   BASO% 0.2 0.0 - 2.0 %    No cultures have been done  Studies/Results:  Ct Abdomen Pelvis W Contrast  02/23/2014   CLINICAL DATA:  Subsequent encounter for ovarian cancer.  EXAM: CT ABDOMEN AND PELVIS WITH CONTRAST  TECHNIQUE: Multidetector CT imaging of the abdomen and pelvis was performed using the standard protocol following bolus administration of intravenous contrast.  CONTRAST:  131m OMNIPAQUE IOHEXOL 300 MG/ML  SOLN  COMPARISON:  11/12/2013  FINDINGS: Lower chest:  Subsegmental atelectasis is noted in both lung bases.  Hepatobiliary: No focal abnormality in the liver on this study without intravenous contrast. No evidence for hepatomegaly. Diffuse nodularity liver contour is unchanged. There is no evidence for gallstones, gallbladder wall thickening, or pericholecystic fluid. No intrahepatic or extrahepatic biliary dilation.  Pancreas: No focal mass lesion. No dilatation of the main duct. No intraparenchymal cyst. No peripancreatic edema.  Spleen: No splenomegaly. No focal mass lesion. Craniocaudal length is 13.7 cm, upper normal.  Adrenals/Urinary Tract: No adrenal nodule or mass. Right kidney is unremarkable. Cortical scarring again noted in the left kidney. No evidence for hydroureter. Bladder is unremarkable.  Stomach/Bowel: Stomach is nondistended. No gastric wall thickening. No evidence of outlet obstruction. Duodenum is normally positioned as is the ligament of Treitz. No small bowel wall thickening. No small bowel dilatation. Terminal ileum is normal. The  appendix is not visualized, but there is no edema or inflammation in the region of the cecum. Diverticular changes are noted in the left colon without evidence of diverticulitis.  Vascular/Lymphatic: No abdominal aortic aneurysm. Portal vein and superior mesenteric vein are patent. Splenic vein is prominent but patent. 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 Retroaortic left renal vein is noted incidentally.  No gastrohepatic or hepatoduodenal ligament lymphadenopathy. No retroperitoneal lymphadenopathy. Abnormal soft tissue is identified in the omentum, having an infiltrative stranding appearance more so than a confluent/bulky appearance.  Reproductive: Uterus is surgically absent.  No adnexal mass.  Other: Small volume perihepatic and perisplenic ascites is evident, prominently decrease compared to the previous study. Intraperitoneal free fluid in the anterior abdomen is also substantially decreased. A trace amount of free fluid is noted in the cul-de-sac on today's study.  Musculoskeletal: Bone windows reveal no worrisome lytic or sclerotic osseous lesions.  IMPRESSION: 1. Interval decrease in ascites. 2. Morphologic changes in the liver consistent with cirrhosis. 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 Portal vein remains patent at this time. 3. Persistent  but decreased abnormal soft tissue attenuation tracking in the omental fat. This may be secondary to interval improvement in metastatic disease. 4. Peritoneal thickening seen in the left abdomen and pelvis on the previous study has decreased and nearly resolved in the interval. This also suggests interval improvement in metastatic disease.   Electronically Signed   By: Misty Stanley M.D.   On: 02/23/2014 14:05   CT findings noted, however I did not discuss with patient today as she is also to see Dr Denman George.  Medications: I have reviewed the patient's current medications.  Possible PCN  allergy was rash at age 3. She believes that she has taken cephalosporins since then without problem, however pharmacy ([facility]) has only the Bactrim and azithromycin prescribed per their records.  DISCUSSION:Note immunosuppressed due to chemo and is diabetic. Inadequate response to Bactrim for thrombophlebitis associated with peripheral administration of taxol and carboplatin, which are not vesicants. Stop Bactrim, begin clindamycin 300 mg q 6 hours starting as soon as script filled today. Use warm soaks every 30 min while awake today and continue thru weekend if helpful. Use ibuprofen with food 3-4 x daily (as platelets still ok). OK to continue hydrocodone as needed for pain. She is to call and will need to be seen if worsens over weekend, or if fever, as she will likely need admission for IV antibiotics if so. History of PCN allergy seems at least questionable. I will see her again on Mon 1-18. She will need PAC before next treatment. This acute problem will need to be resolved prior to [facility] placement.  Assessment/Plan: 1.thrombophlebitis related to most recent peripheral intravenous chemo: change oral antibiotics, add soaks every 30 min and more regular ibuprofen. If worse over weekend will need to be seen, likely will need IV antibiotics if so. 2.high grade serous gyn carcinoma, clinically IIIC ovarian primary: Neoadjuvant chemotherapy with carboplatin and taxol begun 12-07-13 and cycle 4 01-29-14, with neulasta. Ca 125 improving but has not normalized, ascites not accumulating. Repeat CT noted,  seeing Dr Denman George also today. Genetics testing sent 02-14-14. 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 Following liver function closely with chemo. Malignant ascites, other studies also sent with previous paracentesis as requested by Dr Fuller Plan. 4.diabetes on oral agent. She is to watch blood sugars over weekend with the thrombophlebitis 5.stress  urinary incontinence likely related to pelvic mass, symptoms some better 6.chronic cough: better with medications by Dr [name]. Past tobacco 7. 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 No bleeding. OK for NSAID for next couple of days, recheck counts on 02-28-14. 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 Will try to assist her in getting off of supplemental estrogen after rest of situation is more stable 11.narrow angle glaucoma: following with ophthalmology 12.restless legs: severe, interferes greatly with sleep, some better again with ativan + tramadol. Intolerant to Restoril.  13.peripheral IV access not adequate for further chemo. 14.admission for febrile neutropenia after cycle 3, despite neulasta. Neulasta given closer to treatment with cycle 4. Chemo doses already slightly reduced due to cirrhosis.  Written and oral instructions given re thrombophlebitis. Time spent 25 min including >50% counseling and coordination of care.    Eliasar Hlavaty P, MD   02/25/2014, 10:55 AM

## 2014-02-27 ENCOUNTER — Other Ambulatory Visit: Payer: Self-pay | Admitting: Oncology

## 2014-02-28 ENCOUNTER — Other Ambulatory Visit (HOSPITAL_BASED_OUTPATIENT_CLINIC_OR_DEPARTMENT_OTHER): Payer: Medicare Other

## 2014-02-28 ENCOUNTER — Telehealth: Payer: Self-pay | Admitting: Oncology

## 2014-02-28 ENCOUNTER — Ambulatory Visit (HOSPITAL_BASED_OUTPATIENT_CLINIC_OR_DEPARTMENT_OTHER): Payer: Medicare Other | Admitting: Oncology

## 2014-02-28 ENCOUNTER — Telehealth: Payer: Self-pay | Admitting: Gynecologic Oncology

## 2014-02-28 ENCOUNTER — Encounter: Payer: Self-pay | Admitting: Oncology

## 2014-02-28 ENCOUNTER — Telehealth: Payer: Self-pay | Admitting: *Deleted

## 2014-02-28 VITALS — BP 145/49 | HR 66 | Temp 98.2°F | Resp 20 | Ht 61.5 in | Wt 176.3 lb

## 2014-02-28 DIAGNOSIS — C561 Malignant neoplasm of right ovary: Secondary | ICD-10-CM

## 2014-02-28 DIAGNOSIS — T451X5D Adverse effect of antineoplastic and immunosuppressive drugs, subsequent encounter: Secondary | ICD-10-CM

## 2014-02-28 DIAGNOSIS — R05 Cough: Secondary | ICD-10-CM

## 2014-02-28 DIAGNOSIS — K7581 Nonalcoholic steatohepatitis (NASH): Secondary | ICD-10-CM

## 2014-02-28 DIAGNOSIS — L03113 Cellulitis of right upper limb: Secondary | ICD-10-CM

## 2014-02-28 DIAGNOSIS — R053 Chronic cough: Secondary | ICD-10-CM

## 2014-02-28 DIAGNOSIS — G2581 Restless legs syndrome: Secondary | ICD-10-CM

## 2014-02-28 DIAGNOSIS — C569 Malignant neoplasm of unspecified ovary: Secondary | ICD-10-CM

## 2014-02-28 DIAGNOSIS — K7460 Unspecified cirrhosis of liver: Secondary | ICD-10-CM

## 2014-02-28 DIAGNOSIS — I808 Phlebitis and thrombophlebitis of other sites: Secondary | ICD-10-CM

## 2014-02-28 LAB — CBC WITH DIFFERENTIAL/PLATELET
BASO%: 1.4 % (ref 0.0–2.0)
Basophils Absolute: 0.1 10*3/uL (ref 0.0–0.1)
EOS%: 0.5 % (ref 0.0–7.0)
Eosinophils Absolute: 0 10*3/uL (ref 0.0–0.5)
HCT: 31.7 % — ABNORMAL LOW (ref 34.8–46.6)
HGB: 10.4 g/dL — ABNORMAL LOW (ref 11.6–15.9)
LYMPH%: 21.2 % (ref 14.0–49.7)
MCH: 30.6 pg (ref 25.1–34.0)
MCHC: 32.8 g/dL (ref 31.5–36.0)
MCV: 93.2 fL (ref 79.5–101.0)
MONO#: 0.2 10*3/uL (ref 0.1–0.9)
MONO%: 5.2 % (ref 0.0–14.0)
NEUT#: 3.2 10*3/uL (ref 1.5–6.5)
NEUT%: 71.7 % (ref 38.4–76.8)
Platelets: 150 10*3/uL (ref 145–400)
RBC: 3.4 10*6/uL — ABNORMAL LOW (ref 3.70–5.45)
RDW: 20.9 % — ABNORMAL HIGH (ref 11.2–14.5)
WBC: 4.5 10*3/uL (ref 3.9–10.3)
lymph#: 1 10*3/uL (ref 0.9–3.3)

## 2014-02-28 MED ORDER — HYDROCODONE-ACETAMINOPHEN 5-325 MG PO TABS: 1.0000 | ORAL_TABLET | Freq: Four times a day (QID) | ORAL | Status: DC | PRN

## 2014-02-28 MED ORDER — LORAZEPAM 1 MG PO TABS: ORAL_TABLET | ORAL | Status: DC

## 2014-02-28 MED ORDER — LIDOCAINE-PRILOCAINE 2.5-2.5 % EX CREA: 1.0000 "application " | TOPICAL_CREAM | CUTANEOUS | Status: DC | PRN

## 2014-02-28 NOTE — Progress Notes (Signed)
OFFICE PROGRESS NOTE     Physicians:Emma Barrington Ellison, MD (PCP), Luvenia Starch.Wert, P.Nahser, M.Stark/ J.Mann  INTERVAL HISTORY:  Patient is seen, alone for visit, in follow up of thrombophlebitis RUE related to peripheral IV administration of chemotherapy, also restaging CT AP and follow up with Dr Denman George last week as she continues treatment for advanced gyn carcinoma.   She had cycle 4 carboplatin taxol on 02-08-14 for the advanced gyn carcinoma, then local problems at IV site reported 02-21-14. Due to persistent problems on 02-25-14,oral antibiotic was changed from Bactrim to clindamycin; she has also used warm soaks very regularly and has taken some ibuprofen. Hydrocodone at hs has been helpful. She has had no fever or chills, and arm is less painful. Erythema is improving, tho still some especially at more distal area of vein. She has had no problems with the clindamycin, including no diarrhea. She is in agreement with [facility] for remainder of chemotherapy.  CT AP done 02-23-14 shows subsegmental atelectasis in lung bases bilaterally, decreased ascites, spleen ULN at 13.7 cm, no change diffuse nodularilty of liver, uterus absent, no adnexal mass, no adenopathy. Dr Denman George recommended one additional cycle of chemotherapy then interval debulking, anticipating another ~ 3 cycles of chemotherapy after surgery.   ONCOLOGIC HISTORY Patient initially had problems in ~ July 2015 with stress urinary incontinence related to severe chronic cough. Urine culture by PCP showed no infection and she was referred to Dr Clois Comber, who adjusted medications with great improvement in the cough. Despite improved cough, she continued with some urinary incontinence. She saw Dr Ronita Hipps in 10-2013, with pelvic mass on exam and CA 125 1195 on 11-08-13. She had CT AP 11-12-13, which showed large ascites, irregular and shrunken liver, mild splenomegaly, increased soft tissue in pelvis bilaterally and anterior mid and lower  peritoneum, small left pleural effusion. She saw Dr Denman George in consultation on 11-15-13, who agreed that this was likely gyn malignancy. She had US paracentesis of 4.1 liters on 11-18-13, with cytology ([id number]) and immunohistochemical stains consistent with serous carcinoma of Mullerian tract primary, favor ovarian. She had second paracentesis for 3.6 liters on 12-03-13. Gyn oncology recommended initial treatment with chemotherapy. She had first carboplatin and taxol on 12-07-13, with carboplatin dosed at AUC=4 and taxol at 135 mg/m2 due to concern for cirrhosis. She has needed gCSF support and even so was admitted with febrile neutropenia after cycle 3. CA125 was 1938 on 12-01-13 as baseline for chemotherapy, down to 130 by day prior to cycle 4.  Review of systems as above, also: Slept well last few nights. Feels well other than the arm. No increased abdominal distension. No significant cough. Bowels ok. No SOB. Plans to go to work today. Remainder of 10 point Review of Systems negative.  Objective:  Vital signs in last 24 hours:  BP 145/49 mmHg  Pulse 66  Temp(Src) 98.2 F (36.8 C) (Oral)  Resp 20  Ht 5' 1.5" (1.562 m)  Wt 176 lb 4.8 oz (79.969 kg)  BMI 32.78 kg/m2  SpO2 100% Weight up 1 lb. Alert, oriented and appropriate. Ambulatory without difficulty. Looks comfortable, neatly groomed. Alopecia  HEENT:PERRL, sclerae not icteric. Oral mucosa moist without lesions, posterior pharynx clear.  Neck supple. No JVD.  Lymphatics:no cervical,supraclavicular adenopathy Resp: clear to auscultation bilaterally and normal percussion bilaterally Cardio: regular rate and rhythm. No gallop. GI: soft, nontender, full but not more distended, no mass or organomegaly. Normally active bowel sounds.  Musculoskeletal/ Extremities: Right forearm with less erythema and  less tenderness at more distal area of thrombophlebitis, ~ 3.5 cm long, with more proximal area now barely palpable and without any  erythema. No streaking. No other swelling. 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 Otherwise nonfocal. PSYCH appropriate mood and affect Skin otherwise without rash, ecchymosis, petechiae   Lab Results:  Results for orders placed or performed in visit on 02/28/14  CBC with Differential  Result Value Ref Range   WBC 4.5 3.9 - 10.3 10e3/uL   NEUT# 3.2 1.5 - 6.5 10e3/uL   HGB 10.4 (L) 11.6 - 15.9 g/dL   HCT 31.7 (L) 34.8 - 46.6 %   Platelets 150 145 - 400 10e3/uL   MCV 93.2 79.5 - 101.0 fL   MCH 30.6 25.1 - 34.0 pg   MCHC 32.8 31.5 - 36.0 g/dL   RBC 3.40 (L) 3.70 - 5.45 10e6/uL   RDW 20.9 (H) 11.2 - 14.5 %   lymph# 1.0 0.9 - 3.3 10e3/uL   MONO# 0.2 0.1 - 0.9 10e3/uL   Eosinophils Absolute 0.0 0.0 - 0.5 10e3/uL   Basophils Absolute 0.1 0.0 - 0.1 10e3/uL   NEUT% 71.7 38.4 - 76.8 %   LYMPH% 21.2 14.0 - 49.7 %   MONO% 5.2 0.0 - 14.0 %   EOS% 0.5 0.0 - 7.0 %   BASO% 1.4 0.0 - 2.0 %     Studies/Results: EXAM: CT ABDOMEN AND PELVIS WITH CONTRAST  TECHNIQUE: Multidetector CT imaging of the abdomen and pelvis was performed using the standard protocol following bolus administration of intravenous contrast.  CONTRAST: 14m OMNIPAQUE IOHEXOL 300 MG/ML SOLN  COMPARISON: 11/12/2013  FINDINGS: Lower chest: Subsegmental atelectasis is noted in both lung bases.  Hepatobiliary: No focal abnormality in the liver on this study without intravenous contrast. No evidence for hepatomegaly. Diffuse nodularity liver contour is unchanged. There is no evidence for gallstones, gallbladder wall thickening, or pericholecystic fluid. No intrahepatic or extrahepatic biliary dilation.  Pancreas: No focal mass lesion. No dilatation of the main duct. No intraparenchymal cyst. No peripancreatic edema.  Spleen: No splenomegaly. No focal mass lesion. Craniocaudal length is 13.7  cm, upper normal.  Adrenals/Urinary Tract: No adrenal nodule or mass. Right kidney is unremarkable. Cortical scarring again noted in the left kidney. No evidence for hydroureter. Bladder is unremarkable.  Stomach/Bowel: Stomach is nondistended. No gastric wall thickening. No evidence of outlet obstruction. Duodenum is normally positioned as is the ligament of Treitz. No small bowel wall thickening. No small bowel dilatation. Terminal ileum is normal. The appendix is not visualized, but there is no edema or inflammation in the region of the cecum. Diverticular changes are noted in the left colon without evidence of diverticulitis.  Vascular/Lymphatic: No abdominal aortic aneurysm. Portal vein and superior mesenteric vein are patent. Splenic vein is prominent but patent. 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 Retroaortic left renal vein is noted incidentally.  No gastrohepatic or hepatoduodenal ligament lymphadenopathy. No retroperitoneal lymphadenopathy. Abnormal soft tissue is identified in the omentum, having an infiltrative stranding appearance more so than a confluent/bulky appearance.  Reproductive: Uterus is surgically absent. No adnexal mass.  Other: Small volume perihepatic and perisplenic ascites is evident, prominently decrease compared to the previous study. Intraperitoneal free fluid in the anterior abdomen is also substantially decreased. A trace amount of free fluid is noted in the cul-de-sac on today's study.  Musculoskeletal: Bone windows reveal no worrisome lytic or sclerotic osseous lesions.  IMPRESSION: 1.  Interval decrease in ascites. 2. Morphologic changes in the liver consistent with cirrhosis. 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 Portal vein remains patent at this time. 3. Persistent but decreased abnormal soft tissue attenuation tracking in the omental fat. This may be  secondary to interval improvement in metastatic disease. 4. Peritoneal thickening seen in the left abdomen and pelvis on the previous study has decreased and nearly resolved in the interval. This also suggests interval improvement in metastatic disease.   Images reviewed on PACs by MD.  Medications: I have reviewed the patient's current medications. She will continue clindamycin as prescribed. She will use ibuprofen 3-4x daily with food next few days. Hydrocodone refilled. EMLA to pharmacy. She will decrease Estrace to every other day or every 3 days, as she had tolerated this lower dose when she tried to DC once previously.   DISCUSSION: Thrombophlebitis clearly improving with present interventions, which she will continue. This needs to be further improved prior to [facility] placement and prior to next chemo; will request PAC by IR ~ 1-22, then cycle 5 chemo next week. Gyn onc informed of anticipated date next chemotherapy, as Dr Denman George requested at least 3 weeks break after chemo before surgery.  Patient knows to call if anything worse with arm or if problems from antibiotic, including diarrhea. PAC discussed, including EMLA.    Assessment/Plan:  1.thrombophlebitis related to most recent peripheral intravenous chemo: improved in last 72 hours with change in antibiotic to clindamycin and other interventions. Plan as above, with PAC prior to next treatment. 2.high grade serous gyn carcinoma, clinically IIIC ovarian primary: Neoadjuvant chemotherapy with carboplatin and taxol begun 12-07-13 and cycle 4 01-29-14, with neulasta. Partial response clinically, by marker and by CT, plan interval debulking after one additional cycle of chemotherapy planned 03-08-13 with neulasta. Genetics testing sent 02-14-14. 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 Following liver function closely with chemo. Malignant ascites, other studies also sent with previous  paracentesis as requested by Dr Fuller Plan. 4.diabetes on oral agent.  5.stress urinary incontinence likely related to pelvic mass, symptoms some better 6.chronic cough: better with medications by Dr [name]. Past tobacco 7. Thrombocytopenia resolved at present, ok for NSAID as antiinflammatory for thrombophlebitis. 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 Will try to taper down and off prior to surgery. 11.narrow angle glaucoma: following with ophthalmology 12.restless legs: intermittently severe, interferes with sleep. 13.admission for febrile neutropenia after cycle 3, despite neulasta. Neulasta given closer to treatment with cycle 4. Chemo doses already slightly reduced due to cirrhosis.  Chemo and neulasta orders placed, managed care informed of continuation of treatment. Patient understands all instructions and is in agreement with plans. She expresses appreciation for care.   Shuayb Schepers P, MD   02/28/2014, 9:45 AM

## 2014-02-28 NOTE — Telephone Encounter (Signed)
Patient informed that surgery would be scheduled for 2/16 instead of 2/9 since her chemotherapy was moved out a week since she was having a port placed.  Verbalizing understanding.  Advised to call for any questions or concerns.  Also informed that if there was a cancellation next week, we may possibly attempt to move her surgery to 1/26 and cancel her chemotherapy for that day.  We will contact her at the end of this week with a plan.

## 2014-02-28 NOTE — Telephone Encounter (Signed)
, °

## 2014-02-28 NOTE — Telephone Encounter (Signed)
Per staff message and POF I have scheduled appts. Advised scheduler of appts. JMW  

## 2014-03-01 ENCOUNTER — Other Ambulatory Visit (HOSPITAL_COMMUNITY): Payer: Medicare Other

## 2014-03-01 ENCOUNTER — Other Ambulatory Visit: Payer: Self-pay | Admitting: Oncology

## 2014-03-02 ENCOUNTER — Other Ambulatory Visit: Payer: Self-pay | Admitting: Radiology

## 2014-03-02 ENCOUNTER — Other Ambulatory Visit: Payer: Self-pay | Admitting: Oncology

## 2014-03-03 ENCOUNTER — Ambulatory Visit: Payer: Medicare Other | Admitting: Oncology

## 2014-03-03 ENCOUNTER — Other Ambulatory Visit: Payer: Medicare Other

## 2014-03-07 ENCOUNTER — Ambulatory Visit (HOSPITAL_COMMUNITY)
Admission: RE | Admit: 2014-03-07 | Discharge: 2014-03-07 | Disposition: A | Discharge status: Home / Self Care | Payer: Medicare Other | Source: Ambulatory Visit | Attending: Oncology | Admitting: Oncology

## 2014-03-07 ENCOUNTER — Other Ambulatory Visit: Payer: Self-pay | Admitting: Oncology

## 2014-03-07 ENCOUNTER — Encounter (HOSPITAL_COMMUNITY): Payer: Self-pay

## 2014-03-07 DIAGNOSIS — F17290 Nicotine dependence, other tobacco product, uncomplicated: Secondary | ICD-10-CM | POA: Diagnosis not present

## 2014-03-07 DIAGNOSIS — E669 Obesity, unspecified: Secondary | ICD-10-CM | POA: Diagnosis not present

## 2014-03-07 DIAGNOSIS — I10 Essential (primary) hypertension: Secondary | ICD-10-CM | POA: Insufficient documentation

## 2014-03-07 DIAGNOSIS — Z7982 Long term (current) use of aspirin: Secondary | ICD-10-CM | POA: Diagnosis not present

## 2014-03-07 DIAGNOSIS — K7460 Unspecified cirrhosis of liver: Secondary | ICD-10-CM | POA: Diagnosis not present

## 2014-03-07 DIAGNOSIS — I252 Old myocardial infarction: Secondary | ICD-10-CM | POA: Diagnosis not present

## 2014-03-07 DIAGNOSIS — Z7952 Long term (current) use of systemic steroids: Secondary | ICD-10-CM | POA: Diagnosis not present

## 2014-03-07 DIAGNOSIS — Z452 Encounter for adjustment and management of vascular access device: Secondary | ICD-10-CM | POA: Diagnosis present

## 2014-03-07 DIAGNOSIS — Z791 Long term (current) use of non-steroidal anti-inflammatories (NSAID): Secondary | ICD-10-CM | POA: Insufficient documentation

## 2014-03-07 DIAGNOSIS — J45909 Unspecified asthma, uncomplicated: Secondary | ICD-10-CM | POA: Insufficient documentation

## 2014-03-07 DIAGNOSIS — E119 Type 2 diabetes mellitus without complications: Secondary | ICD-10-CM | POA: Diagnosis not present

## 2014-03-07 DIAGNOSIS — C561 Malignant neoplasm of right ovary: Secondary | ICD-10-CM | POA: Diagnosis not present

## 2014-03-07 LAB — CBC WITH DIFFERENTIAL/PLATELET
Basophils Absolute: 0 10*3/uL (ref 0.0–0.1)
Basophils Relative: 0 % (ref 0–1)
Eosinophils Absolute: 0.1 10*3/uL (ref 0.0–0.7)
Eosinophils Relative: 3 % (ref 0–5)
HCT: 34.6 % — ABNORMAL LOW (ref 36.0–46.0)
Hemoglobin: 11 g/dL — ABNORMAL LOW (ref 12.0–15.0)
Lymphocytes Relative: 25 % (ref 12–46)
Lymphs Abs: 1.2 10*3/uL (ref 0.7–4.0)
MCH: 30.1 pg (ref 26.0–34.0)
MCHC: 31.8 g/dL (ref 30.0–36.0)
MCV: 94.8 fL (ref 78.0–100.0)
Monocytes Absolute: 0.3 10*3/uL (ref 0.1–1.0)
Monocytes Relative: 6 % (ref 3–12)
Neutro Abs: 3.2 10*3/uL (ref 1.7–7.7)
Neutrophils Relative %: 66 % (ref 43–77)
Platelets: 149 10*3/uL — ABNORMAL LOW (ref 150–400)
RBC: 3.65 MIL/uL — ABNORMAL LOW (ref 3.87–5.11)
RDW: 18.7 % — ABNORMAL HIGH (ref 11.5–15.5)
WBC: 4.8 10*3/uL (ref 4.0–10.5)

## 2014-03-07 LAB — PROTIME-INR
INR: 1.03 (ref 0.00–1.49)
Prothrombin Time: 13.6 seconds (ref 11.6–15.2)

## 2014-03-07 LAB — GLUCOSE, CAPILLARY: Glucose-Capillary: 72 mg/dL (ref 70–99)

## 2014-03-07 MED ORDER — LIDOCAINE-EPINEPHRINE 2 %-1:100000 IJ SOLN
INTRAMUSCULAR | Status: AC
  Filled 2014-03-07: qty 1

## 2014-03-07 MED ORDER — FENTANYL CITRATE 0.05 MG/ML IJ SOLN
INTRAMUSCULAR | Status: AC
  Filled 2014-03-07: qty 4

## 2014-03-07 MED ORDER — MIDAZOLAM HCL 2 MG/2ML IJ SOLN
INTRAMUSCULAR | Status: AC | PRN
  Administered 2014-03-07: 1 mg via INTRAVENOUS
  Administered 2014-03-07: 0.5 mg via INTRAVENOUS
  Administered 2014-03-07: 1 mg via INTRAVENOUS
  Administered 2014-03-07: 0.5 mg via INTRAVENOUS

## 2014-03-07 MED ORDER — HEPARIN SOD (PORK) LOCK FLUSH 100 UNIT/ML IV SOLN
INTRAVENOUS | Status: AC
  Filled 2014-03-07: qty 5

## 2014-03-07 MED ORDER — HEPARIN SOD (PORK) LOCK FLUSH 100 UNIT/ML IV SOLN
INTRAVENOUS | Status: AC | PRN
  Administered 2014-03-07: 500 [IU]

## 2014-03-07 MED ORDER — SODIUM CHLORIDE 0.9 % IV SOLN
INTRAVENOUS | Status: DC
  Administered 2014-03-07: 12:00:00 via INTRAVENOUS

## 2014-03-07 MED ORDER — LIDOCAINE HCL 1 % IJ SOLN
INTRAMUSCULAR | Status: AC
  Filled 2014-03-07: qty 20

## 2014-03-07 MED ORDER — MIDAZOLAM HCL 2 MG/2ML IJ SOLN
INTRAMUSCULAR | Status: AC
  Filled 2014-03-07: qty 6

## 2014-03-07 MED ORDER — VANCOMYCIN HCL IN DEXTROSE 1-5 GM/200ML-% IV SOLN
1000.0000 mg | Freq: Once | INTRAVENOUS | Status: AC
  Administered 2014-03-07: 1000 mg via INTRAVENOUS
  Filled 2014-03-07: qty 200

## 2014-03-07 MED ORDER — FENTANYL CITRATE 0.05 MG/ML IJ SOLN
INTRAMUSCULAR | Status: AC | PRN
  Administered 2014-03-07: 50 ug via INTRAVENOUS
  Administered 2014-03-07 (×2): 25 ug via INTRAVENOUS

## 2014-03-07 NOTE — Procedures (Signed)
Successful RT IJ POWER PORT TIP SVC/RA NO COMP STABLE FULL REPORT IN PACS

## 2014-03-07 NOTE — H&P (Signed)
Chief Complaint: "I am here for a port."  Referring Physician(s): Livesay,Lennis P  History of Present Illness: Olivia Benson is a 69 y.o. female with high grade serous GYN carcinoma ovarian primary who has been seen in the office by Dr. Marko Plume on 02/28/14 and scheduled today for a port a catheter. The patient has had a H&P performed within the last 30 days, all history, medications, and exam have been reviewed. The patient denies any interval changes since the H&P. She has previously tolerated sedation with a colonoscopy.    Past Medical History  Diagnosis Date  . Diabetes mellitus   . Asthma   . Overactive bladder   . Non-ST elevated myocardial infarction (non-STEMI) Sept 2012    Mild elevation in troponin and CK MB but no evidence of coronary disease at time of cath  . Elevated LFTs   . Obesity   . Hypertension   . Cirrhosis   . Portal hypertension   . Glaucoma, narrow-angle   . Ovarian cancer 11/22/2013    Disseminated ovarian cancer    Past Surgical History  Procedure Laterality Date  . Tonsillectomy  1971  . Abdominal ultrasound  Sept 2012    No gallbladder disease, + fatty liver  . Cardiac catheterization  10/25/2010    EF 60%; Normal coronaries  . Colonoscopy  10/18/2005  . Wh-mammography  11/24/2009  . Partial hysterectomy  1988  . Cosmetic surgery  2003    Allergies: Benadryl; Shellfish allergy; and Penicillins  Medications: Prior to Admission medications   Medication Sig Start Date End Date Taking? Authorizing Provider  albuterol (PROVENTIL HFA;VENTOLIN HFA) 108 (90 BASE) MCG/ACT inhaler Inhale 2 puffs into the lungs every 4 (four) hours as needed for wheezing or shortness of breath.    Yes Historical Provider, MD  aspirin 81 MG tablet Take 81 mg by mouth daily.     Yes Historical Provider, MD  bisoprolol (ZEBETA) 5 MG tablet Take 1/2 tablet daily 01/13/14  Yes Tanda Rockers, MD  clindamycin (CLEOCIN) 300 MG capsule Take 1 capsule (300 mg total) by  mouth every 6 (six) hours. 02/25/14  Yes [name], MD  dexamethasone (DECADRON) 4 MG tablet Take 5 tabs with food(=84m) 12 hrs6 hrs prior to Taxol chemotherapy 01/13/14  Yes Lennis PMarion Downer MD  estradiol (ESTRACE) 1 MG tablet Take 1 mg by mouth at bedtime.    Yes Historical Provider, MD  famotidine (PEPCID) 20 MG tablet Take 20 mg by mouth at bedtime as needed for heartburn (cough).    Yes Historical Provider, MD  HYDROcodone-acetaminophen (NORCO/VICODIN) 5-325 MG per tablet Take 1-2 tablets by mouth every 6 (six) hours as needed for moderate pain. 02/28/14  Yes Lennis PMarion Downer MD  ibuprofen (ADVIL,MOTRIN) 200 MG tablet Take 800 mg by mouth every 6 (six) hours as needed for moderate pain (pain).   Yes Historical Provider, MD  loratadine (CLARITIN) 10 MG tablet Take 10 mg by mouth daily.    Yes Historical Provider, MD  LORazepam (ATIVAN) 1 MG tablet Place 1 tablet under the tongue or swallow every 6 hrs as needed for nausea or as directed 02/28/14  Yes Lennis P Livesay, MD  metFORMIN (GLUCOPHAGE-XR) 500 MG 24 hr tablet Take 500 mg by mouth daily. 11/26/13  Yes Historical Provider, MD  mometasone-formoterol (DULERA) 100-5 MCG/ACT AERO Inhale 2 puffs into the lungs as needed for wheezing or shortness of breath.  10/05/13  Yes MTanda Rockers MD  montelukast (SINGULAIR) 10 MG  tablet Take 10 mg by mouth at bedtime.    Yes Historical Provider, MD  ondansetron (ZOFRAN) 8 MG tablet Take 1 tablet (8 mg total) by mouth every 8 (eight) hours as needed for nausea or vomiting. 12/01/13  Yes Lennis P Livesay, MD  pantoprazole (PROTONIX) 40 MG tablet Take 40 mg by mouth daily.     Yes Historical Provider, MD  prochlorperazine (COMPAZINE) 10 MG tablet Take 1 tablet (10 mg total) by mouth every 6 (six) hours as needed for nausea or vomiting. 12/06/13  Yes [name], MD  sennosides-docusate sodium (SENOKOT-S) 8.6-50 MG tablet Take 1 tablet by mouth daily as needed for constipation (constipation).    Yes  Historical Provider, MD  temazepam (RESTORIL) 15 MG capsule Take 1 capsule (15 mg total) by mouth at bedtime as needed for sleep. 02/02/14  Yes [name], MD  traMADol (ULTRAM) 50 MG tablet Take 1 tablet (50 mg total) by mouth every 8 (eight) hours as needed for moderate pain. 01/13/14  Yes [name], MD  lidocaine-prilocaine (EMLA) cream Apply 1 application topically as needed. Apply 1-2 hours prior to [facility]  access as directed. 02/28/14   [name], MD    Family History  Problem Relation Age of Onset  . Asthma Mother   . Diabetes Mother   . Hypertension Mother   . Allergies Mother   . Rheum arthritis Mother   . Bladder Cancer Father 80    former heavy smoker  . Hypertension Father   . Stroke Father   . Breast cancer Sister 83  . Diabetes Brother   . Allergies Brother   . Asthma Brother   . Diabetes Sister     x 3  . Allergies Sister   . Asthma Sister     x 2   . Breast cancer Maternal Aunt 85  . Breast cancer Cousin 13    (maternal) bilateral cancer  . Aneurysm Son 67    History   Social History  . Marital Status: Single    Spouse Name: N/A    Number of Children: 1 D  . Years of Education: N/A   Occupational History  . Retired    Social History Main Topics  . Smoking status: Former Smoker -- 1.00 packs/day for 1 years    Types: Cigarettes    Quit date: 02/11/1982  . Smokeless tobacco: Current User  . Alcohol Use: Yes     Comment: socially  . Drug Use: No  . Sexual Activity: None   Other Topics Concern  . None   Social History Narrative   Review of Systems: A 12 point ROS discussed and pertinent positives are indicated in the HPI above.  All other systems are negative.  Review of Systems  Vital Signs: BP 132/58 mmHg  Pulse 57  Temp(Src) 98 F (36.7 C) (Oral)  Resp 16  SpO2 99%  Physical Exam  Constitutional: She is oriented to person, place, and time. No distress.  HENT:  Head: Normocephalic and atraumatic.  Neck: No  tracheal deviation present.  Cardiovascular: Normal rate and regular rhythm.  Exam reveals no gallop and no friction rub.   No murmur heard. Pulmonary/Chest: Effort normal and breath sounds normal. No respiratory distress. She has no wheezes. She has no rales.  Abdominal: Soft. Bowel sounds are normal. She exhibits no distension. There is no tenderness.  Neurological: She is alert and oriented to person, place, and time.  Skin: She is not diaphoretic.  Imaging: Ct Abdomen Pelvis W Contrast  02/23/2014   CLINICAL DATA:  Subsequent encounter for ovarian cancer.  EXAM: CT ABDOMEN AND PELVIS WITH CONTRAST  TECHNIQUE: Multidetector CT imaging of the abdomen and pelvis was performed using the standard protocol following bolus administration of intravenous contrast.  CONTRAST:  170m OMNIPAQUE IOHEXOL 300 MG/ML  SOLN  COMPARISON:  11/12/2013  FINDINGS: Lower chest:  Subsegmental atelectasis is noted in both lung bases.  Hepatobiliary: No focal abnormality in the liver on this study without intravenous contrast. No evidence for hepatomegaly. Diffuse nodularity liver contour is unchanged. There is no evidence for gallstones, gallbladder wall thickening, or pericholecystic fluid. No intrahepatic or extrahepatic biliary dilation.  Pancreas: No focal mass lesion. No dilatation of the main duct. No intraparenchymal cyst. No peripancreatic edema.  Spleen: No splenomegaly. No focal mass lesion. Craniocaudal length is 13.7 cm, upper normal.  Adrenals/Urinary Tract: No adrenal nodule or mass. Right kidney is unremarkable. Cortical scarring again noted in the left kidney. No evidence for hydroureter. Bladder is unremarkable.  Stomach/Bowel: Stomach is nondistended. No gastric wall thickening. No evidence of outlet obstruction. Duodenum is normally positioned as is the ligament of Treitz. No small bowel wall thickening. No small bowel dilatation. Terminal ileum is normal. The appendix is not visualized, but there is no  edema or inflammation in the region of the cecum. Diverticular changes are noted in the left colon without evidence of diverticulitis.  Vascular/Lymphatic: No abdominal aortic aneurysm. Portal vein and superior mesenteric vein are patent. Splenic vein is prominent but patent. 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 Retroaortic left renal vein is noted incidentally.  No gastrohepatic or hepatoduodenal ligament lymphadenopathy. No retroperitoneal lymphadenopathy. Abnormal soft tissue is identified in the omentum, having an infiltrative stranding appearance more so than a confluent/bulky appearance.  Reproductive: Uterus is surgically absent.  No adnexal mass.  Other: Small volume perihepatic and perisplenic ascites is evident, prominently decrease compared to the previous study. Intraperitoneal free fluid in the anterior abdomen is also substantially decreased. A trace amount of free fluid is noted in the cul-de-sac on today's study.  Musculoskeletal: Bone windows reveal no worrisome lytic or sclerotic osseous lesions.  IMPRESSION: 1. Interval decrease in ascites. 2. Morphologic changes in the liver consistent with cirrhosis. 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 Portal vein remains patent at this time. 3. Persistent but decreased abnormal soft tissue attenuation tracking in the omental fat. This may be secondary to interval improvement in metastatic disease. 4. Peritoneal thickening seen in the left abdomen and pelvis on the previous study has decreased and nearly resolved in the interval. This also suggests interval improvement in metastatic disease.   Electronically Signed   By: EMisty StanleyM.D.   On: 02/23/2014 14:05    Labs:  CBC:  Recent Labs  02/21/14 1551 02/25/14 0924 02/28/14 0844 03/07/14 1210  WBC 9.5 4.4 4.5 4.8  HGB 10.7* 9.8* 10.4* 11.0*  HCT 33.6* 30.1* 31.7* 34.6*  PLT 114* 110* 150 149*    COAGS:  Recent  Labs  11/15/13 1605  12/20/13 1253 12/27/13 1159 01/03/14 1049 01/27/14 2243  INR  --   < > 1.20* 1.20* 1.20* 1.36  APTT 30  --   --   --   --   --   < > = values in this interval not displayed.  BMP:  Recent Labs  01/27/14 1745 01/28/14 0457 01/29/14 0450 01/30/14 0513 02/07/14 1235 02/14/14 0933  NA 139 137 141 140  142 138  K 3.9 3.4* 4.0 4.0 3.6 4.5  CL 101 104 108 107  --   --   CO2 20 20 22 22 26 25   GLUCOSE 94 106* 121* 112* 94 170*  BUN 9 8 8 8  8.9 13.7  CALCIUM 9.4 8.2* 8.6 7.8* 8.8 8.9  CREATININE 0.71 0.74 0.77 0.84 0.7 0.8  GFRNONAA 88* 87* 86* 71*  --   --   GFRAA >90 >90 >90 82*  --   --     LIVER FUNCTION TESTS:  Recent Labs  01/29/14 0450 01/30/14 0513 02/07/14 1235 02/14/14 0933  BILITOT 0.6 0.5 0.47 1.02  AST 33 35 30 47*  ALT 44* 40* 32 60*  ALKPHOS 107 120* 122 121  PROT 6.8 6.4 7.5 7.6  ALBUMIN 3.0* 2.9* 3.7 3.8    Assessment and Plan: High grade serous GYN carcinoma ovarian primary Seen in the office by Dr. Marko Plume on 02/28/14 Scheduled today for image guided port a catheter placement with moderate sedation Patient has been NPO, no blood thinners taken, afebrile, thrombophlebitis resolved and off antibiotics Risks and Benefits discussed with the patient. All of the patient's questions were answered, patient is agreeable to proceed. Consent signed and in chart.   SignedHedy Jacob 03/07/2014, 12:39 PM

## 2014-03-07 NOTE — Discharge Instructions (Signed)
Implanted Port Insertion, Care After Refer to this sheet in the next few weeks. These instructions provide you with information on caring for yourself after your procedure. Your health care provider may also give you more specific instructions. Your treatment has been planned according to current medical practices, but problems sometimes occur. Call your health care provider if you have any problems or questions after your procedure. WHAT TO EXPECT AFTER THE PROCEDURE After your procedure, it is typical to have the following:   Discomfort at the port insertion site. Ice packs to the area will help.  Bruising on the skin over the port. This will subside in 3-4 days. HOME CARE INSTRUCTIONS  After your port is placed, you will get a manufacturer's information card. The card has information about your port. Keep this card with you at all times.   Know what kind of port you have. There are many types of ports available.   Wear a medical alert bracelet in case of an emergency. This can help alert health care workers that you have a port.   The port can stay in for as long as your health care provider believes it is necessary.   A home health care nurse may give medicines and take care of the port.   You or a family member can get special training and directions for giving medicine and taking care of the port at home.  SEEK MEDICAL CARE IF:   Your port does not flush or you are unable to get a blood return.   You have a fever or chills. SEEK IMMEDIATE MEDICAL CARE IF:  You have new fluid or pus coming from your incision.   You notice a bad smell coming from your incision site.   You have swelling, pain, or more redness at the incision or port site.   You have chest pain or shortness of breath. Document Released: 11/18/2012 Document Revised: 02/02/2013 Document Reviewed: 11/18/2012 [facility] Patient Information 2015 [place], [place]. This information is not intended to replace  advice given to you by your health care provider. Make sure you discuss any questions you have with your health care provider. Implanted [facility] Guide An implanted port is a type of central line that is placed under the skin. Central lines are used to provide IV access when treatment or nutrition needs to be given through a person's veins. Implanted ports are used for long-term IV access. An implanted port may be placed because:   You need IV medicine that would be irritating to the small veins in your hands or arms.   You need long-term IV medicines, such as antibiotics.   You need IV nutrition for a long period.   You need frequent blood draws for lab tests.   You need dialysis.  Implanted ports are usually placed in the chest area, but they can also be placed in the upper arm, the abdomen, or the leg. An implanted port has two main parts:   Reservoir. The reservoir is round and will appear as a small, raised area under your skin. The reservoir is the part where a needle is inserted to give medicines or draw blood.   Catheter. The catheter is a thin, flexible tube that extends from the reservoir. The catheter is placed into a large vein. Medicine that is inserted into the reservoir goes into the catheter and then into the vein.  HOW WILL I CARE FOR MY INCISION SITE? Do not get the incision site wet. Bathe or  shower as directed by your health care provider.  HOW IS MY PORT ACCESSED? Special steps must be taken to access the port:   Before the port is accessed, a numbing cream can be placed on the skin. This helps numb the skin over the port site.   Your health care provider uses a sterile technique to access the port.  Your health care provider must put on a mask and sterile gloves.  The skin over your port is cleaned carefully with an antiseptic and allowed to dry.  The port is gently pinched between sterile gloves, and a needle is inserted into the port.  Only  "non-coring" port needles should be used to access the port. Once the port is accessed, a blood return should be checked. This helps ensure that the port is in the vein and is not clogged.   If your port needs to remain accessed for a constant infusion, a clear (transparent) bandage will be placed over the needle site. The bandage and needle will need to be changed every week, or as directed by your health care provider.   Keep the bandage covering the needle clean and dry. Do not get it wet. Follow your health care provider's instructions on how to take a shower or bath while the port is accessed.   If your port does not need to stay accessed, no bandage is needed over the port.  WHAT IS FLUSHING? Flushing helps keep the port from getting clogged. Follow your health care provider's instructions on how and when to flush the port. Ports are usually flushed with saline solution or a medicine called heparin. The need for flushing will depend on how the port is used.   If the port is used for intermittent medicines or blood draws, the port will need to be flushed:   After medicines have been given.   After blood has been drawn.   As part of routine maintenance.   If a constant infusion is running, the port may not need to be flushed.  HOW LONG WILL MY PORT STAY IMPLANTED? The port can stay in for as long as your health care provider thinks it is needed. When it is time for the port to come out, surgery will be done to remove it. The procedure is similar to the one performed when the port was put in.  WHEN SHOULD I SEEK IMMEDIATE MEDICAL CARE? When you have an implanted port, you should seek immediate medical care if:   You notice a bad smell coming from the incision site.   You have swelling, redness, or drainage at the incision site.   You have more swelling or pain at the port site or the surrounding area.   You have a fever that is not controlled with medicine. Document  Released: 01/28/2005 Document Revised: 11/18/2012 Document Reviewed: 10/05/2012 [facility] Patient Information 2015 [place], [place]. This information is not intended to replace advice given to you by your health care provider. Make sure you discuss any questions you have with your health care provider.   Conscious Sedation, Adult, Care After Refer to this sheet in the next few weeks. These instructions provide you with information on caring for yourself after your procedure. Your health care provider may also give you more specific instructions. Your treatment has been planned according to current medical practices, but problems sometimes occur. Call your health care provider if you have any problems or questions after your procedure. WHAT TO EXPECT AFTER THE PROCEDURE  After  your procedure: You may feel sleepy, clumsy, and have poor balance for several hours. Vomiting may occur if you eat too soon after the procedure. HOME CARE INSTRUCTIONS Do not participate in any activities where you could become injured for at least 24 hours. Do not: Drive. Swim. Ride a bicycle. Operate heavy machinery. Cook. Use power tools. Climb ladders. Work from a high place. Do not make important decisions or sign legal documents until you are improved. If you vomit, drink water, juice, or soup when you can drink without vomiting. Make sure you have little or no nausea before eating solid foods. Only take over-the-counter or prescription medicines for pain, discomfort, or fever as directed by your health care provider. Make sure you and your family fully understand everything about the medicines given to you, including what side effects may occur. You should not drink alcohol, take sleeping pills, or take medicines that cause drowsiness for at least 24 hours. If you smoke, do not smoke without supervision. If you are feeling better, you may resume normal activities 24 hours after you were sedated. Keep all  appointments with your health care provider. SEEK MEDICAL CARE IF: Your skin is pale or bluish in color. You continue to feel nauseous or vomit. Your pain is getting worse and is not helped by medicine. You have bleeding or swelling. You are still sleepy or feeling clumsy after 24 hours. SEEK IMMEDIATE MEDICAL CARE IF: You develop a rash. You have difficulty breathing. You develop any type of allergic problem. You have a fever. MAKE SURE YOU: Understand these instructions. Will watch your condition. Will get help right away if you are not doing well or get worse. Document Released: 11/18/2012 Document Reviewed: 11/18/2012 [facility] Patient Information 2015 [place], [place]. This information is not intended to replace advice given to you by your health care provider. Make sure you discuss any questions you have with your health care provider.

## 2014-03-08 ENCOUNTER — Ambulatory Visit (HOSPITAL_BASED_OUTPATIENT_CLINIC_OR_DEPARTMENT_OTHER): Payer: Medicare Other

## 2014-03-08 VITALS — BP 145/57 | HR 71 | Temp 98.2°F | Resp 18

## 2014-03-08 DIAGNOSIS — C569 Malignant neoplasm of unspecified ovary: Secondary | ICD-10-CM

## 2014-03-08 DIAGNOSIS — Z5111 Encounter for antineoplastic chemotherapy: Secondary | ICD-10-CM

## 2014-03-08 LAB — COMPREHENSIVE METABOLIC PANEL (CC13)
ALT: 41 U/L (ref 0–55)
AST: 33 U/L (ref 5–34)
Albumin: 3.8 g/dL (ref 3.5–5.0)
Alkaline Phosphatase: 117 U/L (ref 40–150)
Anion Gap: 11 mEq/L (ref 3–11)
BUN: 10 mg/dL (ref 7.0–26.0)
CO2: 20 mEq/L — ABNORMAL LOW (ref 22–29)
Calcium: 9.3 mg/dL (ref 8.4–10.4)
Chloride: 108 mEq/L (ref 98–109)
Creatinine: 0.9 mg/dL (ref 0.6–1.1)
EGFR: 71 mL/min/{1.73_m2} — ABNORMAL LOW (ref >90)
Glucose: 265 mg/dl — ABNORMAL HIGH (ref 70–140)
Potassium: 4.1 mEq/L (ref 3.5–5.1)
Sodium: 139 mEq/L (ref 136–145)
Total Bilirubin: 0.69 mg/dL (ref 0.20–1.20)
Total Protein: 7.7 g/dL (ref 6.4–8.3)

## 2014-03-08 MED ORDER — SODIUM CHLORIDE 0.9 % IV SOLN
Freq: Once | INTRAVENOUS | Status: AC
  Administered 2014-03-08: 10:00:00 via INTRAVENOUS

## 2014-03-08 MED ORDER — ONDANSETRON 16 MG/50ML IVPB (CHCC)
INTRAVENOUS | Status: AC
  Filled 2014-03-08: qty 16

## 2014-03-08 MED ORDER — DEXAMETHASONE SODIUM PHOSPHATE 20 MG/5ML IJ SOLN
INTRAMUSCULAR | Status: AC
  Filled 2014-03-08: qty 5

## 2014-03-08 MED ORDER — DIPHENHYDRAMINE HCL 50 MG/ML IJ SOLN
INTRAMUSCULAR | Status: AC
  Filled 2014-03-08: qty 1

## 2014-03-08 MED ORDER — DEXAMETHASONE SODIUM PHOSPHATE 20 MG/5ML IJ SOLN
20.0000 mg | Freq: Once | INTRAMUSCULAR | Status: AC
  Administered 2014-03-08: 20 mg via INTRAVENOUS

## 2014-03-08 MED ORDER — ONDANSETRON 16 MG/50ML IVPB (CHCC)
16.0000 mg | Freq: Once | INTRAVENOUS | Status: AC
  Administered 2014-03-08: 16 mg via INTRAVENOUS

## 2014-03-08 MED ORDER — SODIUM CHLORIDE 0.9 % IV SOLN
389.6000 mg | Freq: Once | INTRAVENOUS | Status: AC
  Administered 2014-03-08: 390 mg via INTRAVENOUS
  Filled 2014-03-08: qty 39

## 2014-03-08 MED ORDER — FAMOTIDINE IN NACL 20-0.9 MG/50ML-% IV SOLN
INTRAVENOUS | Status: AC
  Filled 2014-03-08: qty 50

## 2014-03-08 MED ORDER — LORAZEPAM 1 MG PO TABS
1.0000 mg | ORAL_TABLET | Freq: Once | ORAL | Status: AC | PRN
  Administered 2014-03-08: 1 mg via ORAL

## 2014-03-08 MED ORDER — DIPHENHYDRAMINE HCL 50 MG/ML IJ SOLN
25.0000 mg | Freq: Once | INTRAMUSCULAR | Status: AC
  Administered 2014-03-08: 25 mg via INTRAVENOUS

## 2014-03-08 MED ORDER — FAMOTIDINE IN NACL 20-0.9 MG/50ML-% IV SOLN
20.0000 mg | Freq: Once | INTRAVENOUS | Status: AC
  Administered 2014-03-08: 20 mg via INTRAVENOUS

## 2014-03-08 MED ORDER — HEPARIN SOD (PORK) LOCK FLUSH 100 UNIT/ML IV SOLN
500.0000 [IU] | Freq: Once | INTRAVENOUS | Status: AC | PRN
  Administered 2014-03-08: 500 [IU]
  Filled 2014-03-08: qty 5

## 2014-03-08 MED ORDER — SODIUM CHLORIDE 0.9 % IJ SOLN
10.0000 mL | INTRAMUSCULAR | Status: DC | PRN
  Administered 2014-03-08: 10 mL
  Filled 2014-03-08: qty 10

## 2014-03-08 MED ORDER — PACLITAXEL CHEMO INJECTION 300 MG/50ML
135.0000 mg/m2 | Freq: Once | INTRAVENOUS | Status: AC
  Administered 2014-03-08: 258 mg via INTRAVENOUS
  Filled 2014-03-08: qty 43

## 2014-03-08 MED ORDER — LORAZEPAM 1 MG PO TABS
ORAL_TABLET | ORAL | Status: AC
  Filled 2014-03-08: qty 1

## 2014-03-08 NOTE — Patient Instructions (Signed)
[facility] Discharge Instructions for Patients Receiving Chemotherapy  Today you received the following chemotherapy agents Paclitaxel/Carboplatin.   To help prevent nausea and vomiting after your treatment, we encourage you to take your nausea medication as directed.    If you develop nausea and vomiting that is not controlled by your nausea medication, call the clinic.   BELOW ARE SYMPTOMS THAT SHOULD BE REPORTED IMMEDIATELY:  *FEVER GREATER THAN 100.5 F  *CHILLS WITH OR WITHOUT FEVER  NAUSEA AND VOMITING THAT IS NOT CONTROLLED WITH YOUR NAUSEA MEDICATION  *UNUSUAL SHORTNESS OF BREATH  *UNUSUAL BRUISING OR BLEEDING  TENDERNESS IN MOUTH AND THROAT WITH OR WITHOUT PRESENCE OF ULCERS  *URINARY PROBLEMS  *BOWEL PROBLEMS  UNUSUAL RASH Items with * indicate a potential emergency and should be followed up as soon as possible.  Feel free to call the clinic you have any questions or concerns. The clinic phone number is [phone].

## 2014-03-10 ENCOUNTER — Other Ambulatory Visit: Payer: Medicare Other

## 2014-03-10 ENCOUNTER — Ambulatory Visit: Payer: Medicare Other | Admitting: Oncology

## 2014-03-12 ENCOUNTER — Ambulatory Visit (HOSPITAL_BASED_OUTPATIENT_CLINIC_OR_DEPARTMENT_OTHER): Payer: Medicare Other

## 2014-03-12 DIAGNOSIS — C561 Malignant neoplasm of right ovary: Secondary | ICD-10-CM

## 2014-03-12 DIAGNOSIS — Z5189 Encounter for other specified aftercare: Secondary | ICD-10-CM

## 2014-03-12 DIAGNOSIS — C801 Malignant (primary) neoplasm, unspecified: Secondary | ICD-10-CM

## 2014-03-12 MED ORDER — PEGFILGRASTIM INJECTION 6 MG/0.6ML ~~LOC~~
6.0000 mg | PREFILLED_SYRINGE | Freq: Once | SUBCUTANEOUS | Status: AC
  Administered 2014-03-12: 6 mg via SUBCUTANEOUS

## 2014-03-15 ENCOUNTER — Other Ambulatory Visit: Payer: Self-pay | Admitting: Oncology

## 2014-03-15 DIAGNOSIS — C569 Malignant neoplasm of unspecified ovary: Secondary | ICD-10-CM

## 2014-03-16 ENCOUNTER — Telehealth: Payer: Self-pay | Admitting: Oncology

## 2014-03-16 ENCOUNTER — Encounter: Payer: Self-pay | Admitting: Genetic Counselor

## 2014-03-16 ENCOUNTER — Telehealth: Payer: Self-pay | Admitting: Genetic Counselor

## 2014-03-16 DIAGNOSIS — Z1379 Encounter for other screening for genetic and chromosomal anomalies: Secondary | ICD-10-CM | POA: Insufficient documentation

## 2014-03-16 NOTE — Telephone Encounter (Signed)
Revealed negative genetic tesitng on OvaNext panel testing.

## 2014-03-16 NOTE — Telephone Encounter (Signed)
per pof to CX infusion 2/16 due to surgery-CX inf

## 2014-03-16 NOTE — Progress Notes (Signed)
HPI: [name] was previously seen in the [place] clinic due to a personal and family history of cancer and concerns regarding a hereditary predisposition to cancer. Please refer to our prior cancer genetics clinic note for more information regarding [name]'s medical, social and family histories, and our assessment and recommendations, at the time. [name] recent genetic test results were disclosed to her, as were recommendations warranted by these results. These results and recommendations are discussed in more detail below.  GENETIC TEST RESULTS: At the time of [name]'s visit, we recommended she pursue genetic testing of the OvaNext gene panel. The OvaNext gene panel offered by [facility] and includes sequencing and rearrangement analysis for the following 24 genes: ATM, BARD1, BRCA1, BRCA2, BRIP1, CDH1, CHEK2, EPCAM, MLH1, MRE11A, MSH2, MSH6, MUTYH, NBN, NF1, PALB2, PMS2, PTEN, RAD50, RAD51C, RAD51D, SMARCA4, STK11, and TP53.  The report date is March 15, 2014.  Testing was performed at [facility]. Genetic testing was normal, and did not reveal a deleterious mutation in these genes. The test report has been scanned into EPIC and is located under the Media tab.   We discussed with [name] that since the current genetic testing is not perfect, it is possible there may be a gene mutation in one of these genes that current testing cannot detect, but that chance is small. We also discussed, that it is possible that another gene that has not yet been discovered, or that we have not yet tested, is responsible for the cancer diagnoses in the family, and it is, therefore, important to remain in touch with cancer genetics in the future so that we can continue to offer [name] the most up to date genetic testing.   CANCER SCREENING RECOMMENDATIONS: This result is reassuring and suggests that [name]'s personal and family history of cancer was most  likely not due to an inherited predisposition associated with one of these genes. Most cancers happen by chance and this negative test, along with details of her family history, suggests that her cancer falls into this category. We, therefore, recommended she continue to follow the cancer management and screening guidelines provided by her oncology and primary providers.   RECOMMENDATIONS FOR FAMILY MEMBERS: Women in this family might be at some increased risk of developing cancer, over the general population risk, simply due to the family history of cancer. We recommended women in this family have a yearly mammogram beginning at age 4, or 54 years younger than the earliest onset of cancer, an an annual clinical breast exam, and perform monthly breast self-exams. Women in this family should also have a gynecological exam as recommended by their primary provider. All family members should have a colonoscopy by age 9.  FOLLOW-UP: Lastly, we discussed with [name] that cancer genetics is a rapidly advancing field and it is possible that new genetic tests will be appropriate for her and/or her family members in the future. We encouraged her to remain in contact with cancer genetics on an annual basis so we can update her personal and family histories and let her know of advances in cancer genetics that may benefit this family.   Our contact number was provided. [name] questions were answered to her satisfaction, and she knows she is welcome to call us at anytime with additional questions or concerns.   Roma Kayser, MS, [facility] Certified Genetic Counselor [email or web address]

## 2014-03-17 ENCOUNTER — Other Ambulatory Visit (HOSPITAL_BASED_OUTPATIENT_CLINIC_OR_DEPARTMENT_OTHER): Payer: Medicare Other

## 2014-03-17 ENCOUNTER — Encounter: Payer: Self-pay | Admitting: Oncology

## 2014-03-17 ENCOUNTER — Ambulatory Visit (HOSPITAL_BASED_OUTPATIENT_CLINIC_OR_DEPARTMENT_OTHER): Payer: Medicare Other | Admitting: Oncology

## 2014-03-17 ENCOUNTER — Telehealth: Payer: Self-pay | Admitting: Oncology

## 2014-03-17 VITALS — BP 122/68 | HR 73 | Temp 97.8°F | Resp 18 | Ht 61.5 in | Wt 175.8 lb

## 2014-03-17 DIAGNOSIS — K7581 Nonalcoholic steatohepatitis (NASH): Secondary | ICD-10-CM

## 2014-03-17 DIAGNOSIS — Z95828 Presence of other vascular implants and grafts: Secondary | ICD-10-CM

## 2014-03-17 DIAGNOSIS — C569 Malignant neoplasm of unspecified ovary: Secondary | ICD-10-CM

## 2014-03-17 DIAGNOSIS — E1169 Type 2 diabetes mellitus with other specified complication: Secondary | ICD-10-CM

## 2014-03-17 DIAGNOSIS — E119 Type 2 diabetes mellitus without complications: Secondary | ICD-10-CM

## 2014-03-17 DIAGNOSIS — R161 Splenomegaly, not elsewhere classified: Secondary | ICD-10-CM

## 2014-03-17 DIAGNOSIS — T451X5A Adverse effect of antineoplastic and immunosuppressive drugs, initial encounter: Secondary | ICD-10-CM

## 2014-03-17 DIAGNOSIS — R05 Cough: Secondary | ICD-10-CM

## 2014-03-17 DIAGNOSIS — N393 Stress incontinence (female) (male): Secondary | ICD-10-CM

## 2014-03-17 DIAGNOSIS — C561 Malignant neoplasm of right ovary: Secondary | ICD-10-CM

## 2014-03-17 DIAGNOSIS — K766 Portal hypertension: Secondary | ICD-10-CM

## 2014-03-17 DIAGNOSIS — R053 Chronic cough: Secondary | ICD-10-CM

## 2014-03-17 DIAGNOSIS — E669 Obesity, unspecified: Secondary | ICD-10-CM

## 2014-03-17 DIAGNOSIS — D6959 Other secondary thrombocytopenia: Secondary | ICD-10-CM

## 2014-03-17 DIAGNOSIS — G2581 Restless legs syndrome: Secondary | ICD-10-CM

## 2014-03-17 DIAGNOSIS — K7460 Unspecified cirrhosis of liver: Secondary | ICD-10-CM

## 2014-03-17 DIAGNOSIS — D701 Agranulocytosis secondary to cancer chemotherapy: Secondary | ICD-10-CM

## 2014-03-17 LAB — CBC WITH DIFFERENTIAL/PLATELET
BASO%: 0.5 % (ref 0.0–2.0)
Basophils Absolute: 0.1 10*3/uL (ref 0.0–0.1)
EOS%: 0.4 % (ref 0.0–7.0)
Eosinophils Absolute: 0.1 10*3/uL (ref 0.0–0.5)
HCT: 31.4 % — ABNORMAL LOW (ref 34.8–46.6)
HGB: 10.1 g/dL — ABNORMAL LOW (ref 11.6–15.9)
LYMPH%: 13.8 % — ABNORMAL LOW (ref 14.0–49.7)
MCH: 30.3 pg (ref 25.1–34.0)
MCHC: 32.2 g/dL (ref 31.5–36.0)
MCV: 94.3 fL (ref 79.5–101.0)
MONO#: 3.1 10*3/uL — ABNORMAL HIGH (ref 0.1–0.9)
MONO%: 26.1 % — ABNORMAL HIGH (ref 0.0–14.0)
NEUT#: 7 10*3/uL — ABNORMAL HIGH (ref 1.5–6.5)
NEUT%: 59.2 % (ref 38.4–76.8)
Platelets: 88 10*3/uL — ABNORMAL LOW (ref 145–400)
RBC: 3.33 10*6/uL — ABNORMAL LOW (ref 3.70–5.45)
RDW: 17.8 % — ABNORMAL HIGH (ref 11.2–14.5)
WBC: 11.8 10*3/uL — ABNORMAL HIGH (ref 3.9–10.3)
lymph#: 1.6 10*3/uL (ref 0.9–3.3)

## 2014-03-17 LAB — COMPREHENSIVE METABOLIC PANEL (CC13)
ALT: 55 U/L (ref 0–55)
AST: 44 U/L — ABNORMAL HIGH (ref 5–34)
Albumin: 3.8 g/dL (ref 3.5–5.0)
Alkaline Phosphatase: 129 U/L (ref 40–150)
Anion Gap: 11 mEq/L (ref 3–11)
BUN: 11.5 mg/dL (ref 7.0–26.0)
CO2: 24 mEq/L (ref 22–29)
Calcium: 8.5 mg/dL (ref 8.4–10.4)
Chloride: 106 mEq/L (ref 98–109)
Creatinine: 0.8 mg/dL (ref 0.6–1.1)
EGFR: 77 mL/min/{1.73_m2} — ABNORMAL LOW (ref >90)
Glucose: 129 mg/dl (ref 70–140)
Potassium: 3.5 mEq/L (ref 3.5–5.1)
Sodium: 140 mEq/L (ref 136–145)
Total Bilirubin: 0.86 mg/dL (ref 0.20–1.20)
Total Protein: 7.2 g/dL (ref 6.4–8.3)

## 2014-03-17 NOTE — Telephone Encounter (Signed)
per pof to sch appt-sent MW email to sch pt trmt-pt has MY chart will see updated sch

## 2014-03-17 NOTE — Progress Notes (Signed)
OFFICE PROGRESS NOTE   March 17, 2014   Physicians:Emma Barrington Ellison, MD (PCP), Luvenia Starch.Wert, P.Nahser, M.Stark/ J.Mann  INTERVAL HISTORY:  Patient is seen, together with her significant other, in continuing attention to continuing treatment for advanced gyn carcinoma. She has had good partial response clinically and by scans to 5 cycles of carboplatin taxol given 12-07-13 thru 03-08-14; she is to have surgery by Dr Denman George on 03-29-14.  Cycle 5 was delayed to allow PAC placement. She had a difficult time with that treatment, with increased neuropathy in feet and legs "like ice picks"  in addition to worsening of her chronic restless legs syndrome. She was very fatigued, tho that is improved now. She has had no bleeding, with platelets 88k today. She has metallic taste, but has continued to eat and to drink fluids. She does not feel that ascites is worsening. Chronic cough is controlled and she is encouraged to use her breathing medications as prescribed from now until surgery. The PAC functioned well for chemo and is no longer uncomfortable from placement.  PAC in Flu vaccine done Genetics testing from 02-2014 normal (OvaNext panel)  NOTE patient reports "trouble waking up" after 2 prior surgeries. I have asked her to be sure to let anesthesia know, and have sent message also to gyn onc staff. She also recalls extreme nausea after anesthesia previously.  ONCOLOGIC HISTORY Patient initially had problems in ~ July 2015 with stress urinary incontinence related to severe chronic cough. Urine culture by PCP showed no infection and she was referred to Dr Clois Comber, who adjusted medications with great improvement in the cough. Despite improved cough, she continued with some urinary incontinence. She saw Dr Ronita Hipps in 10-2013, with pelvic mass on exam and CA 125 1195 on 11-08-13. She had CT AP 11-12-13, which showed large ascites, irregular and shrunken liver, mild splenomegaly, increased soft  tissue in pelvis bilaterally and anterior mid and lower peritoneum, small left pleural effusion. She saw Dr Denman George in consultation on 11-15-13, who agreed that this was likely gyn malignancy. 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 She had second paracentesis for 3.6 liters on 12-03-13. Gyn oncology recommended initial treatment with chemotherapy. She had first carboplatin and taxol on 12-07-13, with carboplatin dosed at AUC=4 and taxol at 135 mg/m2 due to concern for cirrhosis. She has needed gCSF support and even so was admitted with febrile neutropenia after cycle 3. CA125 was 1938 on 12-01-13 as baseline for chemotherapy, down to 130 prior to cycle 4 and 45 after cycle 5.   Review of systems as above, also: No fever or symptoms of infection. Bowels are moving. Tolerating estrace now down to every other day, with occasional hot flashes which are tolerable. Remainder of 10 point Review of Systems negative.  Objective:  Vital signs in last 24 hours:  BP 122/68 mmHg  Pulse 73  Temp(Src) 97.8 F (36.6 C) (Oral)  Resp 18  Ht 5' 1.5" (1.562 m)  Wt 175 lb 12.8 oz (79.742 kg)  BMI 32.68 kg/m2  SpO2 100% Weight down 1 lb Alert, oriented and appropriate. Ambulatory without difficulty.  Total alopecia  HEENT:PERRL, sclerae not icteric. Oral mucosa moist without lesions, posterior pharynx clear.  Neck supple. No JVD.  Lymphatics:no cervical,supraclavicular adenopathy Resp: clear to auscultation bilaterally and normal percussion bilaterally Cardio: regular rate and rhythm. No gallop. GI: abdomen obese, soft, nontender, somewhat full but  not distended, no mass or organomegaly. Normally active bowel sounds.  Musculoskeletal/ Extremities: without pitting edema, cords, tenderness Neuro: no peripheral neuropathy. Otherwise nonfocal. PSYCH appropriate mood and  affect Skin without rash, ecchymosis, petechiae Portacath-without erythema or tenderness, minimal resolving ecchymosis  Lab Results:  Results for orders placed or performed in visit on 03/17/14  CBC with Differential  Result Value Ref Range   WBC 11.8 (H) 3.9 - 10.3 10e3/uL   NEUT# 7.0 (H) 1.5 - 6.5 10e3/uL   HGB 10.1 (L) 11.6 - 15.9 g/dL   HCT 31.4 (L) 34.8 - 46.6 %   Platelets 88 (L) 145 - 400 10e3/uL   MCV 94.3 79.5 - 101.0 fL   MCH 30.3 25.1 - 34.0 pg   MCHC 32.2 31.5 - 36.0 g/dL   RBC 3.33 (L) 3.70 - 5.45 10e6/uL   RDW 17.8 (H) 11.2 - 14.5 %   lymph# 1.6 0.9 - 3.3 10e3/uL   MONO# 3.1 (H) 0.1 - 0.9 10e3/uL   Eosinophils Absolute 0.1 0.0 - 0.5 10e3/uL   Basophils Absolute 0.1 0.0 - 0.1 10e3/uL   NEUT% 59.2 38.4 - 76.8 %   LYMPH% 13.8 (L) 14.0 - 49.7 %   MONO% 26.1 (H) 0.0 - 14.0 %   EOS% 0.4 0.0 - 7.0 %   BASO% 0.5 0.0 - 2.0 %  Comprehensive metabolic panel (Cmet) - [facility]  Result Value Ref Range   Sodium 140 136 - 145 mEq/L   Potassium 3.5 3.5 - 5.1 mEq/L   Chloride 106 98 - 109 mEq/L   CO2 24 22 - 29 mEq/L   Glucose 129 70 - 140 mg/dl   BUN 11.5 7.0 - 26.0 mg/dL   Creatinine 0.8 0.6 - 1.1 mg/dL   Total Bilirubin 0.86 0.20 - 1.20 mg/dL   Alkaline Phosphatase 129 40 - 150 U/L   AST 44 (H) 5 - 34 U/L   ALT 55 0 - 55 U/L   Total Protein 7.2 6.4 - 8.3 g/dL   Albumin 3.8 3.5 - 5.0 g/dL   Calcium 8.5 8.4 - 10.4 mg/dL   Anion Gap 11 3 - 11 mEq/L   EGFR 77 (L) >90 ml/min/1.73 m2    CA 125 available after visit down to 45, this having been 1938 prior to start of chemo.  Studies/Results:  No results found.  Medications: I have reviewed the patient's current medications. She will try to further decrease estrace to every third day, trying to wean off entirely.   DISCUSSION:  she understands that we expect to continue chemotherapy after surgery. Not discussed now, but with PAC she may be willing to go to dose dense treatment, which likely would be better  tolerated. Discussed problems with previous anesthesia as above  Assessment/Plan:  1.high grade serous gyn carcinoma, clinically IIIC ovarian primary: Neoadjuvant chemotherapy with carboplatin and taxol begun 12-07-13 and cycle 5 03-08-14 with neulasta. Partial response clinically, by marker and by CT, plan interval debulking 03-29-14 then anticipate additional chemo. Genetics testing has returned normal. 2.history of problems waking up after anesthesia previously (and extreme nausea after anesthesia).  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 Following liver function closely with chemo.Known to Dr Lucio Edward. 4.diabetes on oral agent.  5.stress urinary incontinence likely related to pelvic mass, symptoms some better 6.chronic cough: better with medications by Dr [name]. Past tobacco 7. 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 Hopefully platelets will recover adequately for surgery as planned, pre op  labs scheduled 03-22-14. 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 Tapering down and will try to DC 11.narrow angle glaucoma: following with ophthalmology 12.restless legs: intermittently severe, interferes with sleep. 13.admission for febrile neutropenia after cycle 3, despite neulasta and despite chemo doses slightly reduced due to cirrhosis. 14.PAC in   Patient is in agreement with recommendations and plans as above. She will call if any concerns prior to 03-29-14 surgery. I will see her back ~ 3 weeks after date of surgery, tho can move that appointment if needed.  Tarisha Fader P, MD   03/17/2014, 2:56 PM

## 2014-03-18 ENCOUNTER — Telehealth: Payer: Self-pay | Admitting: Medical Oncology

## 2014-03-18 ENCOUNTER — Telehealth: Payer: Self-pay | Admitting: *Deleted

## 2014-03-18 LAB — CA 125: CA 125: 45 U/mL — ABNORMAL HIGH (ref <35)

## 2014-03-18 NOTE — Telephone Encounter (Signed)
Per staff message and POF I have scheduled appts. Advised scheduler of appts. JMW  

## 2014-03-18 NOTE — Telephone Encounter (Signed)
-----   Message from Janace Hoard, RN sent at 03/18/2014  2:35 PM EST -----   ----- Message -----    From: Gordy Levan, MD    Sent: 03/18/2014   9:35 AM      To: Baruch Merl, RN, Janace Hoard, RN, #  Labs seen and need follow up: please let her know ca 125 now down to 73, which she will be glad to hear as the last chemo was not easy

## 2014-03-18 NOTE — Telephone Encounter (Signed)
-----   Message from Janace Hoard, RN sent at 03/18/2014  2:35 PM EST -----   ----- Message -----    From: Gordy Levan, MD    Sent: 03/18/2014   9:35 AM      To: Baruch Merl, RN, Janace Hoard, RN, #  Labs seen and need follow up: please let her know ca 125 now down to 50, which she will be glad to hear as the last chemo was not easy

## 2014-03-18 NOTE — Telephone Encounter (Signed)
Pt.notified

## 2014-03-21 ENCOUNTER — Telehealth: Payer: Self-pay

## 2014-03-21 NOTE — Telephone Encounter (Addendum)
[name] received day 1 Cycle 5 Taxol /Carboplatin on 1-26-1-16 via Porta-Cath. Yesterday she developed an area the size of a half dollar on her left, posterior forearm. The area is red, raised with hard center.  The site is uncomfortable.   Afebrile.  Applied warm soaks yesterday with no change to site.  No NSAID taken as she has surgery next week.

## 2014-03-21 NOTE — Patient Instructions (Addendum)
Olivia Benson  03/21/2014   Your procedure is scheduled on: 03/29/14   Report to [facility] Main  Entrance and follow signs to               [place] at 11:45 AM.   Call this number if you have problems the morning of surgery [phone]   Remember:  Do not eat food  :After Midnight.           MAY HAVE CLEAR LIQUIDS UNTIL 8:15 AM     Take these medicines the morning of surgery with A SIP OF WATER: PROTONIX / BISOPROLOL / MAY TAKE HYDROCODONE OR TRAMADOL IF NEEDED              CLEAR LIQUIDS ONLY THE DAY BEFORE SURGERY / DRINK ONE BOTTLE OF MAG CITRATE THE AFTERNOON BEFORE SURGERY                                You may not have any metal on your body including hair pins and              piercings  Do not wear jewelry, make-up, lotions, powders or perfumes.             Do not wear nail polish.  Do not shave  48 hours prior to surgery.              Men may shave face and neck.   Do not bring valuables to the hospital. [place].  Contacts, dentures or bridgework may not be worn into surgery.  Leave suitcase in the car. After surgery it may be brought to your room.     Patients discharged the day of surgery will not be allowed to drive home.  Name and phone number of your driver:  Special Instructions: N/A              Please read over the following fact sheets you were given: _____________________________________________________________________                                                     [facility]  Before surgery, you can play an important role.  Because skin is not sterile, your skin needs to be as free of germs as possible.  You can reduce the number of germs on your skin by washing with CHG (chlorahexidine gluconate) soap before surgery.  CHG is an antiseptic cleaner which kills germs and bonds with the skin to continue killing germs even after  washing. Please DO NOT use if you have an allergy to CHG or antibacterial soaps.  If your skin becomes reddened/irritated stop using the CHG and inform your nurse when you arrive at Short Stay. Do not shave (including legs and underarms) for at least 48 hours prior to the first CHG shower.  You may shave your face. Please follow these instructions carefully:   1.  Shower with CHG Soap the night before surgery and the  morning of Surgery.   2.  If you choose to wash your hair,  wash your hair first as usual with your  normal  Shampoo.   3.  After you shampoo, rinse your hair and body thoroughly to remove the  shampoo.                                         4.  Use CHG as you would any other liquid soap.  You can apply chg directly  to the skin and wash . Gently wash with scrungie or clean wascloth    5.  Apply the CHG Soap to your body ONLY FROM THE NECK DOWN.   Do not use on open                           Wound or open sores. Avoid contact with eyes, ears mouth and genitals (private parts).                        Genitals (private parts) with your normal soap.              6.  Wash thoroughly, paying special attention to the area where your surgery  will be performed.   7.  Thoroughly rinse your body with warm water from the neck down.   8.  DO NOT shower/wash with your normal soap after using and rinsing off  the CHG Soap .                9.  Pat yourself dry with a clean towel.             10.  Wear clean pajamas.             11.  Place clean sheets on your bed the night of your first shower and do not  sleep with pets.  Day of Surgery : Do not apply any lotions/deodorants the morning of surgery.  Please wear clean clothes to the hospital/surgery center.  FAILURE TO FOLLOW THESE INSTRUCTIONS MAY RESULT IN THE CANCELLATION OF YOUR SURGERY    PATIENT  SIGNATURE_________________________________  ______________________________________________________________________     Olivia Benson  An incentive spirometer is a tool that can help keep your lungs clear and active. This tool measures how well you are filling your lungs with each breath. Taking long deep breaths may help reverse or decrease the chance of developing breathing (pulmonary) problems (especially infection) following:  A long period of time when you are unable to move or be active. BEFORE THE PROCEDURE   If the spirometer includes an indicator to show your best effort, your nurse or respiratory therapist will set it to a desired goal.  If possible, sit up straight or lean slightly forward. Try not to slouch.  Hold the incentive spirometer in an upright position. INSTRUCTIONS FOR USE   Sit on the edge of your bed if possible, or sit up as far as you can in bed or on a chair.  Hold the incentive spirometer in an upright position.  Breathe out normally.  Place the mouthpiece in your mouth and seal your lips tightly around it.  Breathe in slowly and as deeply as possible, raising the piston or the ball toward the top of the column.  Hold your breath for 3-5 seconds or for as long as possible. Allow the piston or ball to fall to the bottom of  the column.  Remove the mouthpiece from your mouth and breathe out normally.  Rest for a few seconds and repeat Steps 1 through 7 at least 10 times every 1-2 hours when you are awake. Take your time and take a few normal breaths between deep breaths.  The spirometer may include an indicator to show your best effort. Use the indicator as a goal to work toward during each repetition.  After each set of 10 deep breaths, practice coughing to be sure your lungs are clear. If you have an incision (the cut made at the time of surgery), support your incision when coughing by placing a pillow or rolled up towels firmly against  it. Once you are able to get out of bed, walk around indoors and cough well. You may stop using the incentive spirometer when instructed by your caregiver.  RISKS AND COMPLICATIONS  Take your time so you do not get dizzy or light-headed.  If you are in pain, you may need to take or ask for pain medication before doing incentive spirometry. It is harder to take a deep breath if you are having pain. AFTER USE  Rest and breathe slowly and easily.  It can be helpful to keep track of a log of your progress. Your caregiver can provide you with a simple table to help with this. If you are using the spirometer at home, follow these instructions: [place] IF:   You are having difficultly using the spirometer.  You have trouble using the spirometer as often as instructed.  Your pain medication is not giving enough relief while using the spirometer.  You develop fever of 100.5 F (38.1 C) or higher. SEEK IMMEDIATE MEDICAL CARE IF:   You cough up bloody sputum that had not been present before.  You develop fever of 102 F (38.9 C) or greater.  You develop worsening pain at or near the incision site. MAKE SURE YOU:   Understand these instructions.  Will watch your condition.  Will get help right away if you are not doing well or get worse. Document Released: 06/10/2006 Document Revised: 04/22/2011 Document Reviewed: 08/11/2006 ExitCare Patient Information 2014 ExitCare, [place].   ________________________________________________________________________  WHAT IS A BLOOD TRANSFUSION? Blood Transfusion Information  A transfusion is the replacement of blood or some of its parts. Blood is made up of multiple cells which provide different functions.  Red blood cells carry oxygen and are used for blood loss replacement.  White blood cells fight against infection.  Platelets control bleeding.  Plasma helps clot blood.  Other blood products are available for specialized  needs, such as hemophilia or other clotting disorders. BEFORE THE TRANSFUSION  Who gives blood for transfusions?   Healthy volunteers who are fully evaluated to make sure their blood is safe. This is blood bank blood. Transfusion therapy is the safest it has ever been in the practice of medicine. Before blood is taken from a donor, a complete history is taken to make sure that person has no history of diseases nor engages in risky social behavior (examples are intravenous drug use or sexual activity with multiple partners). The donor's travel history is screened to minimize risk of transmitting infections, such as malaria. The donated blood is tested for signs of infectious diseases, such as HIV and hepatitis. The blood is then tested to be sure it is compatible with you in order to minimize the chance of a transfusion reaction. If you or a relative donates blood, this is often  done in anticipation of surgery and is not appropriate for emergency situations. It takes many days to process the donated blood. RISKS AND COMPLICATIONS Although transfusion therapy is very safe and saves many lives, the main dangers of transfusion include:   Getting an infectious disease.  Developing a transfusion reaction. This is an allergic reaction to something in the blood you were given. Every precaution is taken to prevent this. The decision to have a blood transfusion has been considered carefully by your caregiver before blood is given. Blood is not given unless the benefits outweigh the risks. AFTER THE TRANSFUSION  Right after receiving a blood transfusion, you will usually feel much better and more energetic. This is especially true if your red blood cells have gotten low (anemic). The transfusion raises the level of the red blood cells which carry oxygen, and this usually causes an energy increase.  The nurse administering the transfusion will monitor you carefully for complications. HOME CARE INSTRUCTIONS   No special instructions are needed after a transfusion. You may find your energy is better. Speak with your caregiver about any limitations on activity for underlying diseases you may have. SEEK MEDICAL CARE IF:   Your condition is not improving after your transfusion.  You develop redness or irritation at the intravenous (IV) site. SEEK IMMEDIATE MEDICAL CARE IF:  Any of the following symptoms occur over the next 12 hours:  Shaking chills.  You have a temperature by mouth above 102 F (38.9 C), not controlled by medicine.  Chest, back, or muscle pain.  People around you feel you are not acting correctly or are confused.  Shortness of breath or difficulty breathing.  Dizziness and fainting.  You get a rash or develop hives.  You have a decrease in urine output.  Your urine turns a dark color or changes to pink, red, or brown. Any of the following symptoms occur over the next 10 days:  You have a temperature by mouth above 102 F (38.9 C), not controlled by medicine.  Shortness of breath.  Weakness after normal activity.  The white part of the eye turns yellow (jaundice).  You have a decrease in the amount of urine or are urinating less often.  Your urine turns a dark color or changes to pink, red, or brown. Document Released: 01/26/2000 Document Revised: 04/22/2011 Document Reviewed: 09/14/2007 ExitCare Patient Information 2014 ExitCare, [place].  _______________________________________________________________________   CLEAR LIQUID DIET   Foods Allowed                                                                     Foods Excluded  Coffee and tea, regular and decaf                             liquids that you cannot  Plain Jell-O in any flavor                                             see through such as: Fruit ices (not with fruit pulp)  milk, soups, orange juice  Iced Popsicles                                                   All solid food Carbonated beverages, regular and diet                                    Cranberry, grape and apple juices Sports drinks like Gatorade Lightly seasoned clear broth or consume(fat free) Sugar, honey syrup _____________________________________________________________________

## 2014-03-21 NOTE — Telephone Encounter (Addendum)
Told Olivia Benson that Dr. Marko Plume said to continue warm soaks to the forearm. If she develops a fever 100.5 or greater or site looks worse , call Joylene John NP with [facility] as surgery is 03-29-14 Olivia Benson verbalized understanding.

## 2014-03-22 ENCOUNTER — Encounter (HOSPITAL_COMMUNITY): Payer: Self-pay

## 2014-03-22 ENCOUNTER — Encounter (HOSPITAL_COMMUNITY)
Admission: RE | Admit: 2014-03-22 | Discharge: 2014-03-22 | Disposition: A | Discharge status: Home / Self Care | Payer: Medicare Other | Source: Ambulatory Visit | Attending: Gynecologic Oncology | Admitting: Gynecologic Oncology

## 2014-03-22 DIAGNOSIS — Z01818 Encounter for other preprocedural examination: Secondary | ICD-10-CM | POA: Diagnosis present

## 2014-03-22 DIAGNOSIS — C569 Malignant neoplasm of unspecified ovary: Secondary | ICD-10-CM | POA: Insufficient documentation

## 2014-03-22 HISTORY — DX: Paresthesia of skin: R20.2

## 2014-03-22 HISTORY — DX: Other complications of anesthesia, initial encounter: T88.59XA

## 2014-03-22 HISTORY — DX: Adverse effect of unspecified anesthetic, initial encounter: T41.45XA

## 2014-03-22 HISTORY — DX: Restless legs syndrome: G25.81

## 2014-03-22 HISTORY — DX: Personal history of other malignant neoplasm of skin: Z85.828

## 2014-03-22 HISTORY — DX: Other specified postprocedural states: Z98.890

## 2014-03-22 HISTORY — DX: Frequency of micturition: R35.0

## 2014-03-22 HISTORY — DX: Family history of other specified conditions: Z84.89

## 2014-03-22 HISTORY — DX: Nausea with vomiting, unspecified: R11.2

## 2014-03-22 LAB — COMPREHENSIVE METABOLIC PANEL
ALT: 38 U/L — ABNORMAL HIGH (ref 0–35)
AST: 38 U/L — ABNORMAL HIGH (ref 0–37)
Albumin: 4.1 g/dL (ref 3.5–5.2)
Alkaline Phosphatase: 138 U/L — ABNORMAL HIGH (ref 39–117)
Anion gap: 7 (ref 5–15)
BUN: 13 mg/dL (ref 6–23)
CO2: 26 mmol/L (ref 19–32)
Calcium: 8.8 mg/dL (ref 8.4–10.5)
Chloride: 109 mmol/L (ref 96–112)
Creatinine, Ser: 0.76 mg/dL (ref 0.50–1.10)
GFR calc Af Amer: >90 mL/min (ref >90)
GFR calc non Af Amer: 86 mL/min — ABNORMAL LOW (ref >90)
Glucose, Bld: 144 mg/dL — ABNORMAL HIGH (ref 70–99)
Potassium: 4.5 mmol/L (ref 3.5–5.1)
Sodium: 142 mmol/L (ref 135–145)
Total Bilirubin: 0.9 mg/dL (ref 0.3–1.2)
Total Protein: 7.6 g/dL (ref 6.0–8.3)

## 2014-03-22 LAB — URINALYSIS, ROUTINE W REFLEX MICROSCOPIC
Bilirubin Urine: NEGATIVE
Glucose, UA: NEGATIVE mg/dL
Hgb urine dipstick: NEGATIVE
Ketones, ur: NEGATIVE mg/dL
Leukocytes, UA: NEGATIVE
Nitrite: NEGATIVE
Protein, ur: NEGATIVE mg/dL
Specific Gravity, Urine: 1.006 (ref 1.005–1.030)
Urobilinogen, UA: 0.2 mg/dL (ref 0.0–1.0)
pH: 6 (ref 5.0–8.0)

## 2014-03-22 LAB — ABO/RH: ABO/RH(D): O NEG

## 2014-03-22 LAB — CBC WITH DIFFERENTIAL/PLATELET
Basophils Absolute: 0 10*3/uL (ref 0.0–0.1)
Basophils Relative: 0 % (ref 0–1)
Eosinophils Absolute: 0 10*3/uL (ref 0.0–0.7)
Eosinophils Relative: 0 % (ref 0–5)
HCT: 33.7 % — ABNORMAL LOW (ref 36.0–46.0)
Hemoglobin: 10.7 g/dL — ABNORMAL LOW (ref 12.0–15.0)
Lymphocytes Relative: 18 % (ref 12–46)
Lymphs Abs: 1 10*3/uL (ref 0.7–4.0)
MCH: 30 pg (ref 26.0–34.0)
MCHC: 31.8 g/dL (ref 30.0–36.0)
MCV: 94.4 fL (ref 78.0–100.0)
Monocytes Absolute: 0.3 10*3/uL (ref 0.1–1.0)
Monocytes Relative: 5 % (ref 3–12)
Neutro Abs: 4.5 10*3/uL (ref 1.7–7.7)
Neutrophils Relative %: 77 % (ref 43–77)
Platelets: 78 10*3/uL — ABNORMAL LOW (ref 150–400)
RBC: 3.57 MIL/uL — ABNORMAL LOW (ref 3.87–5.11)
RDW: 17.3 % — ABNORMAL HIGH (ref 11.5–15.5)
WBC: 5.8 10*3/uL (ref 4.0–10.5)

## 2014-03-22 NOTE — Progress Notes (Signed)
Cbc results routed to [facility] cross np inbasket by epic

## 2014-03-22 NOTE — Progress Notes (Signed)
I spoke with Dr. Marcell Barlow concerning family hx of anesthesia problems. No action needed at this time.

## 2014-03-28 NOTE — Progress Notes (Signed)
Patient made aware surgery time is now 1100am on 03/29/2014.  Patient to arrive at 0900am.  Patient voiced understanding.

## 2014-03-29 ENCOUNTER — Encounter (HOSPITAL_COMMUNITY): Payer: Self-pay | Admitting: *Deleted

## 2014-03-29 ENCOUNTER — Inpatient Hospital Stay (HOSPITAL_COMMUNITY): Payer: Medicare Other | Admitting: Anesthesiology

## 2014-03-29 ENCOUNTER — Encounter (HOSPITAL_COMMUNITY)
Admission: RE | Disposition: A | Discharge status: Home / Self Care | Payer: Self-pay | Source: Ambulatory Visit | Attending: Obstetrics & Gynecology

## 2014-03-29 ENCOUNTER — Ambulatory Visit: Payer: Medicare Other

## 2014-03-29 ENCOUNTER — Inpatient Hospital Stay (HOSPITAL_COMMUNITY)
Admission: RE | Admit: 2014-03-29 | Discharge: 2014-04-01 | DRG: 357 | Disposition: A | Discharge status: Home / Self Care | Payer: Medicare Other | Source: Ambulatory Visit | Attending: Obstetrics & Gynecology | Admitting: Obstetrics & Gynecology

## 2014-03-29 DIAGNOSIS — C482 Malignant neoplasm of peritoneum, unspecified: Principal | ICD-10-CM

## 2014-03-29 DIAGNOSIS — Z79899 Other long term (current) drug therapy: Secondary | ICD-10-CM | POA: Diagnosis not present

## 2014-03-29 DIAGNOSIS — I252 Old myocardial infarction: Secondary | ICD-10-CM | POA: Diagnosis not present

## 2014-03-29 DIAGNOSIS — Z87891 Personal history of nicotine dependence: Secondary | ICD-10-CM | POA: Diagnosis not present

## 2014-03-29 DIAGNOSIS — C569 Malignant neoplasm of unspecified ovary: Secondary | ICD-10-CM

## 2014-03-29 DIAGNOSIS — Z7982 Long term (current) use of aspirin: Secondary | ICD-10-CM

## 2014-03-29 DIAGNOSIS — K660 Peritoneal adhesions (postprocedural) (postinfection): Secondary | ICD-10-CM | POA: Diagnosis present

## 2014-03-29 DIAGNOSIS — I10 Essential (primary) hypertension: Secondary | ICD-10-CM | POA: Diagnosis present

## 2014-03-29 DIAGNOSIS — R162 Hepatomegaly with splenomegaly, not elsewhere classified: Secondary | ICD-10-CM | POA: Diagnosis present

## 2014-03-29 DIAGNOSIS — E119 Type 2 diabetes mellitus without complications: Secondary | ICD-10-CM | POA: Diagnosis present

## 2014-03-29 DIAGNOSIS — R180 Malignant ascites: Secondary | ICD-10-CM

## 2014-03-29 DIAGNOSIS — N393 Stress incontinence (female) (male): Secondary | ICD-10-CM | POA: Diagnosis present

## 2014-03-29 DIAGNOSIS — E669 Obesity, unspecified: Secondary | ICD-10-CM | POA: Diagnosis present

## 2014-03-29 DIAGNOSIS — Z01812 Encounter for preprocedural laboratory examination: Secondary | ICD-10-CM | POA: Diagnosis not present

## 2014-03-29 DIAGNOSIS — R971 Elevated cancer antigen 125 [CA 125]: Secondary | ICD-10-CM

## 2014-03-29 DIAGNOSIS — J45909 Unspecified asthma, uncomplicated: Secondary | ICD-10-CM | POA: Diagnosis present

## 2014-03-29 DIAGNOSIS — R188 Other ascites: Secondary | ICD-10-CM | POA: Diagnosis present

## 2014-03-29 DIAGNOSIS — K7460 Unspecified cirrhosis of liver: Secondary | ICD-10-CM | POA: Diagnosis present

## 2014-03-29 DIAGNOSIS — C561 Malignant neoplasm of right ovary: Secondary | ICD-10-CM

## 2014-03-29 DIAGNOSIS — Z6833 Body mass index (BMI) 33.0-33.9, adult: Secondary | ICD-10-CM | POA: Diagnosis not present

## 2014-03-29 DIAGNOSIS — Z803 Family history of malignant neoplasm of breast: Secondary | ICD-10-CM | POA: Diagnosis not present

## 2014-03-29 HISTORY — PX: SALPINGOOPHORECTOMY: SHX82

## 2014-03-29 HISTORY — PX: LAPAROTOMY: SHX154

## 2014-03-29 LAB — CBC
HCT: 33.5 % — ABNORMAL LOW (ref 36.0–46.0)
Hemoglobin: 10.9 g/dL — ABNORMAL LOW (ref 12.0–15.0)
MCH: 30.4 pg (ref 26.0–34.0)
MCHC: 32.5 g/dL (ref 30.0–36.0)
MCV: 93.3 fL (ref 78.0–100.0)
Platelets: 140 10*3/uL — ABNORMAL LOW (ref 150–400)
RBC: 3.59 MIL/uL — ABNORMAL LOW (ref 3.87–5.11)
RDW: 17 % — ABNORMAL HIGH (ref 11.5–15.5)
WBC: 4.4 10*3/uL (ref 4.0–10.5)

## 2014-03-29 LAB — GLUCOSE, CAPILLARY
Glucose-Capillary: 105 mg/dL — ABNORMAL HIGH (ref 70–99)
Glucose-Capillary: 142 mg/dL — ABNORMAL HIGH (ref 70–99)
Glucose-Capillary: 199 mg/dL — ABNORMAL HIGH (ref 70–99)
Glucose-Capillary: 209 mg/dL — ABNORMAL HIGH (ref 70–99)
Glucose-Capillary: 234 mg/dL — ABNORMAL HIGH (ref 70–99)

## 2014-03-29 LAB — PREPARE RBC (CROSSMATCH)

## 2014-03-29 SURGERY — SALPINGO-OOPHORECTOMY, OPEN
Anesthesia: General

## 2014-03-29 MED ORDER — PROPOFOL 10 MG/ML IV BOLUS
INTRAVENOUS | Status: DC | PRN
  Administered 2014-03-29: 120 mg via INTRAVENOUS

## 2014-03-29 MED ORDER — CLINDAMYCIN PHOSPHATE 900 MG/50ML IV SOLN
900.0000 mg | INTRAVENOUS | Status: AC
  Administered 2014-03-29: 900 mg via INTRAVENOUS

## 2014-03-29 MED ORDER — LORAZEPAM 2 MG/ML IJ SOLN: 0.5000 mg | Freq: Four times a day (QID) | INTRAMUSCULAR | Status: DC | PRN

## 2014-03-29 MED ORDER — PANTOPRAZOLE SODIUM 40 MG IV SOLR
40.0000 mg | Freq: Every day | INTRAVENOUS | Status: DC
  Administered 2014-03-29 – 2014-03-30 (×2): 40 mg via INTRAVENOUS
  Filled 2014-03-29 (×4): qty 40

## 2014-03-29 MED ORDER — NALOXONE HCL 0.4 MG/ML IJ SOLN: 0.4000 mg | INTRAMUSCULAR | Status: DC | PRN

## 2014-03-29 MED ORDER — 0.9 % SODIUM CHLORIDE (POUR BTL) OPTIME
TOPICAL | Status: DC | PRN
  Administered 2014-03-29: 2000 mL

## 2014-03-29 MED ORDER — HYDROMORPHONE HCL 1 MG/ML IJ SOLN
0.2500 mg | INTRAMUSCULAR | Status: DC | PRN
  Administered 2014-03-29 (×4): 0.5 mg via INTRAVENOUS

## 2014-03-29 MED ORDER — KCL IN DEXTROSE-NACL 20-5-0.45 MEQ/L-%-% IV SOLN
INTRAVENOUS | Status: DC
  Administered 2014-03-29: 15:00:00 via INTRAVENOUS
  Administered 2014-03-29: 1000 mL via INTRAVENOUS
  Filled 2014-03-29 (×3): qty 1000

## 2014-03-29 MED ORDER — ONDANSETRON HCL 4 MG/2ML IJ SOLN
INTRAMUSCULAR | Status: AC
  Filled 2014-03-29: qty 2

## 2014-03-29 MED ORDER — ENOXAPARIN SODIUM 40 MG/0.4ML ~~LOC~~ SOLN
40.0000 mg | SUBCUTANEOUS | Status: AC
  Administered 2014-03-29: 40 mg via SUBCUTANEOUS
  Filled 2014-03-29: qty 0.4

## 2014-03-29 MED ORDER — NEOSTIGMINE METHYLSULFATE 10 MG/10ML IV SOLN
INTRAVENOUS | Status: DC | PRN
  Administered 2014-03-29: 4 mg via INTRAVENOUS

## 2014-03-29 MED ORDER — GLYCOPYRROLATE 0.2 MG/ML IJ SOLN
INTRAMUSCULAR | Status: DC | PRN
  Administered 2014-03-29: 0.6 mg via INTRAVENOUS

## 2014-03-29 MED ORDER — SODIUM CHLORIDE 0.9 % IJ SOLN
INTRAMUSCULAR | Status: AC
  Filled 2014-03-29: qty 20

## 2014-03-29 MED ORDER — CIPROFLOXACIN IN D5W 400 MG/200ML IV SOLN
400.0000 mg | INTRAVENOUS | Status: AC
  Administered 2014-03-29: 400 mg via INTRAVENOUS

## 2014-03-29 MED ORDER — DEXAMETHASONE SODIUM PHOSPHATE 10 MG/ML IJ SOLN
INTRAMUSCULAR | Status: DC | PRN
  Administered 2014-03-29: 10 mg via INTRAVENOUS

## 2014-03-29 MED ORDER — SODIUM CHLORIDE 0.9 % IJ SOLN: 9.0000 mL | INTRAMUSCULAR | Status: DC | PRN

## 2014-03-29 MED ORDER — SUCCINYLCHOLINE CHLORIDE 20 MG/ML IJ SOLN
INTRAMUSCULAR | Status: DC | PRN
Start: 2014-03-29 — End: 2014-03-29
  Administered 2014-03-29: 100 mg via INTRAVENOUS

## 2014-03-29 MED ORDER — MOMETASONE FURO-FORMOTEROL FUM 100-5 MCG/ACT IN AERO: 2.0000 | INHALATION_SPRAY | RESPIRATORY_TRACT | Status: DC | PRN

## 2014-03-29 MED ORDER — HYDROMORPHONE HCL 1 MG/ML IJ SOLN
INTRAMUSCULAR | Status: AC
  Filled 2014-03-29: qty 1

## 2014-03-29 MED ORDER — ENOXAPARIN SODIUM 40 MG/0.4ML ~~LOC~~ SOLN
40.0000 mg | SUBCUTANEOUS | Status: DC
  Administered 2014-03-30 – 2014-04-01 (×3): 40 mg via SUBCUTANEOUS
  Filled 2014-03-29 (×3): qty 0.4

## 2014-03-29 MED ORDER — ONDANSETRON HCL 4 MG/2ML IJ SOLN
INTRAMUSCULAR | Status: DC | PRN
  Administered 2014-03-29: 4 mg via INTRAVENOUS

## 2014-03-29 MED ORDER — PROPOFOL 10 MG/ML IV BOLUS
INTRAVENOUS | Status: AC
  Filled 2014-03-29: qty 20

## 2014-03-29 MED ORDER — MIDAZOLAM HCL 2 MG/2ML IJ SOLN
INTRAMUSCULAR | Status: AC
  Filled 2014-03-29: qty 2

## 2014-03-29 MED ORDER — CIPROFLOXACIN IN D5W 400 MG/200ML IV SOLN
INTRAVENOUS | Status: AC
  Filled 2014-03-29: qty 200

## 2014-03-29 MED ORDER — DEXAMETHASONE SODIUM PHOSPHATE 10 MG/ML IJ SOLN
INTRAMUSCULAR | Status: AC
  Filled 2014-03-29: qty 1

## 2014-03-29 MED ORDER — HYDROMORPHONE HCL 1 MG/ML IJ SOLN
0.2500 mg | INTRAMUSCULAR | Status: DC | PRN
  Administered 2014-03-29 (×2): 0.5 mg via INTRAVENOUS

## 2014-03-29 MED ORDER — FENTANYL CITRATE 0.05 MG/ML IJ SOLN
INTRAMUSCULAR | Status: AC
  Filled 2014-03-29: qty 2

## 2014-03-29 MED ORDER — FENTANYL CITRATE 0.05 MG/ML IJ SOLN
INTRAMUSCULAR | Status: AC
  Filled 2014-03-29: qty 5

## 2014-03-29 MED ORDER — BUPIVACAINE LIPOSOME 1.3 % IJ SUSP
20.0000 mL | Freq: Once | INTRAMUSCULAR | Status: DC
  Filled 2014-03-29: qty 20

## 2014-03-29 MED ORDER — INSULIN ASPART 100 UNIT/ML ~~LOC~~ SOLN
0.0000 [IU] | SUBCUTANEOUS | Status: DC
  Administered 2014-03-29 (×2): 3 [IU] via SUBCUTANEOUS
  Administered 2014-03-30: 2 [IU] via SUBCUTANEOUS
  Administered 2014-03-30: 1 [IU] via SUBCUTANEOUS
  Administered 2014-03-30: 2 [IU] via SUBCUTANEOUS
  Administered 2014-03-30: 1 [IU] via SUBCUTANEOUS

## 2014-03-29 MED ORDER — HYDROMORPHONE 0.3 MG/ML IV SOLN
INTRAVENOUS | Status: AC
  Filled 2014-03-29: qty 25

## 2014-03-29 MED ORDER — GLYCOPYRROLATE 0.2 MG/ML IJ SOLN
INTRAMUSCULAR | Status: AC
  Filled 2014-03-29: qty 3

## 2014-03-29 MED ORDER — LACTATED RINGERS IV SOLN
INTRAVENOUS | Status: DC
  Administered 2014-03-29: 1000 mL via INTRAVENOUS
  Administered 2014-03-29: 13:00:00 via INTRAVENOUS

## 2014-03-29 MED ORDER — BUPIVACAINE LIPOSOME 1.3 % IJ SUSP
INTRAMUSCULAR | Status: DC | PRN
  Administered 2014-03-29: 20 mL

## 2014-03-29 MED ORDER — ALBUTEROL SULFATE (2.5 MG/3ML) 0.083% IN NEBU: 2.5000 mg | INHALATION_SOLUTION | RESPIRATORY_TRACT | Status: DC | PRN

## 2014-03-29 MED ORDER — BISOPROLOL FUMARATE 5 MG PO TABS
2.5000 mg | ORAL_TABLET | Freq: Every morning | ORAL | Status: DC
  Administered 2014-03-30 – 2014-04-01 (×3): 2.5 mg via ORAL
  Filled 2014-03-29 (×3): qty 0.5

## 2014-03-29 MED ORDER — ONDANSETRON HCL 4 MG/2ML IJ SOLN
4.0000 mg | Freq: Four times a day (QID) | INTRAMUSCULAR | Status: DC | PRN
  Administered 2014-03-29 – 2014-03-30 (×2): 4 mg via INTRAVENOUS
  Filled 2014-03-29 (×2): qty 2

## 2014-03-29 MED ORDER — CLINDAMYCIN PHOSPHATE 900 MG/50ML IV SOLN
INTRAVENOUS | Status: AC
  Filled 2014-03-29: qty 50

## 2014-03-29 MED ORDER — FENTANYL CITRATE 0.05 MG/ML IJ SOLN
INTRAMUSCULAR | Status: DC | PRN
  Administered 2014-03-29 (×2): 50 ug via INTRAVENOUS
  Administered 2014-03-29: 25 ug via INTRAVENOUS
  Administered 2014-03-29 (×2): 50 ug via INTRAVENOUS
  Administered 2014-03-29: 25 ug via INTRAVENOUS
  Administered 2014-03-29: 100 ug via INTRAVENOUS

## 2014-03-29 MED ORDER — SODIUM CHLORIDE 0.9 % IV SOLN
Freq: Once | INTRAVENOUS | Status: DC
Start: 2014-03-29 — End: 2014-03-29

## 2014-03-29 MED ORDER — NEOSTIGMINE METHYLSULFATE 10 MG/10ML IV SOLN
INTRAVENOUS | Status: AC
  Filled 2014-03-29: qty 1

## 2014-03-29 MED ORDER — ONDANSETRON HCL 4 MG/2ML IJ SOLN: 4.0000 mg | Freq: Once | INTRAMUSCULAR | Status: DC | PRN

## 2014-03-29 MED ORDER — ROCURONIUM BROMIDE 100 MG/10ML IV SOLN
INTRAVENOUS | Status: DC | PRN
  Administered 2014-03-29: 10 mg via INTRAVENOUS
  Administered 2014-03-29: 30 mg via INTRAVENOUS
  Administered 2014-03-29 (×2): 10 mg via INTRAVENOUS

## 2014-03-29 MED ORDER — ROCURONIUM BROMIDE 100 MG/10ML IV SOLN
INTRAVENOUS | Status: AC
  Filled 2014-03-29: qty 1

## 2014-03-29 MED ORDER — HYDROMORPHONE 0.3 MG/ML IV SOLN
INTRAVENOUS | Status: DC
  Administered 2014-03-29: 0.799 mg via INTRAVENOUS
  Administered 2014-03-29: 14:00:00 via INTRAVENOUS
  Administered 2014-03-30: 0.8 mg via INTRAVENOUS
  Administered 2014-03-30: 0.2 mg via INTRAVENOUS
  Administered 2014-03-30: 0.399 mg via INTRAVENOUS
  Administered 2014-03-30: 0.599 mg via INTRAVENOUS
  Administered 2014-03-30: 0.7999 mg via INTRAVENOUS
  Administered 2014-03-30: 0.6 mg via INTRAVENOUS
  Administered 2014-03-31: 0.2 mg via INTRAVENOUS
  Administered 2014-03-31: 0.199 mg via INTRAVENOUS

## 2014-03-29 MED ORDER — MIDAZOLAM HCL 5 MG/5ML IJ SOLN
INTRAMUSCULAR | Status: DC | PRN
  Administered 2014-03-29 (×2): 1 mg via INTRAVENOUS

## 2014-03-29 SURGICAL SUPPLY — 45 items
ATTRACTOMAT 16X20 MAGNETIC DRP (DRAPES) IMPLANT
BLADE EXTENDED COATED 6.5IN (ELECTRODE) ×3 IMPLANT
CHLORAPREP W/TINT 26ML (MISCELLANEOUS) ×3 IMPLANT
CLIP TI MEDIUM 6 (CLIP) ×3 IMPLANT
CLIP TI MEDIUM LARGE 6 (CLIP) IMPLANT
CONT SPEC 4OZ CLIKSEAL STRL BL (MISCELLANEOUS) IMPLANT
COVER SURGICAL LIGHT HANDLE (MISCELLANEOUS) ×3 IMPLANT
DRAPE UTILITY 15X26 (DRAPE) ×3 IMPLANT
DRAPE WARM FLUID 44X44 (DRAPE) ×3 IMPLANT
DRESSING TELFA ISLAND 4X8 (GAUZE/BANDAGES/DRESSINGS) ×3 IMPLANT
DRSG TELFA 4X10 ISLAND STR (GAUZE/BANDAGES/DRESSINGS) ×3 IMPLANT
ELECT LIGASURE SHORT 9 REUSE (ELECTRODE) IMPLANT
ELECT REM PT RETURN 9FT ADLT (ELECTROSURGICAL) ×3
ELECTRODE REM PT RTRN 9FT ADLT (ELECTROSURGICAL) ×2 IMPLANT
GAUZE SPONGE 4X4 12PLY STRL (GAUZE/BANDAGES/DRESSINGS) IMPLANT
GAUZE SPONGE 4X4 16PLY XRAY LF (GAUZE/BANDAGES/DRESSINGS) IMPLANT
GLOVE BIO SURGEON STRL SZ 6 (GLOVE) ×6 IMPLANT
GLOVE BIO SURGEON STRL SZ 6.5 (GLOVE) ×6 IMPLANT
GOWN STRL REUS W/ TWL LRG LVL3 (GOWN DISPOSABLE) ×4 IMPLANT
GOWN STRL REUS W/TWL LRG LVL3 (GOWN DISPOSABLE) ×6
KIT BASIN OR (CUSTOM PROCEDURE TRAY) ×3 IMPLANT
LIQUID BAND (GAUZE/BANDAGES/DRESSINGS) IMPLANT
LOOP VESSEL MAXI BLUE (MISCELLANEOUS) IMPLANT
NEEDLE HYPO 22GX1.5 SAFETY (NEEDLE) ×6 IMPLANT
NS IRRIG 1000ML POUR BTL (IV SOLUTION) IMPLANT
PACK GENERAL/GYN (CUSTOM PROCEDURE TRAY) ×3 IMPLANT
SHEET LAVH (DRAPES) ×3 IMPLANT
SLEEVE SURGEON STRL (DRAPES) ×3 IMPLANT
SPONGE LAP 18X18 X RAY DECT (DISPOSABLE) ×6 IMPLANT
STAPLER VISISTAT 35W (STAPLE) IMPLANT
SUT MNCRL AB 4-0 PS2 18 (SUTURE) IMPLANT
SUT PDS AB 1 TP1 96 (SUTURE) ×6 IMPLANT
SUT VIC AB 0 CT1 36 (SUTURE) ×6 IMPLANT
SUT VIC AB 2-0 CT1 36 (SUTURE) ×6 IMPLANT
SUT VIC AB 2-0 CT2 27 (SUTURE) ×6 IMPLANT
SUT VIC AB 2-0 SH 27 (SUTURE) ×6
SUT VIC AB 2-0 SH 27X BRD (SUTURE) ×4 IMPLANT
SUT VIC AB 3-0 CTX 36 (SUTURE) IMPLANT
SUT VIC AB 3-0 SH 27 (SUTURE) ×6
SUT VIC AB 3-0 SH 27XBRD (SUTURE) ×4 IMPLANT
SYR 20CC LL (SYRINGE) ×6 IMPLANT
TOWEL OR 17X26 10 PK STRL BLUE (TOWEL DISPOSABLE) ×3 IMPLANT
TOWEL OR NON WOVEN STRL DISP B (DISPOSABLE) ×3 IMPLANT
TRAY FOLEY CATH 14FRSI W/METER (CATHETERS) ×3 IMPLANT
WATER STERILE IRR 1500ML POUR (IV SOLUTION) IMPLANT

## 2014-03-29 NOTE — H&P (Signed)
PREOP H&P   Assessment/Plan:  Ms. KUMARI SCULLEY [name]") is a 69 y.o. 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  She has undergone neoadjuvant chemotherapy (5 cycles the last of which was on 03/08/2013) and has demonstrated an excellent clinical response to this. I am recommending an interval BSO, omentectomy, debulking procedure. She developed thrombocytopenia in this cycle (platelets 75 last week).  We will perform the debulking surgery today. Postoperatively I would recommend an additional 3 cycles of consolidation chemotherapy. I recommend assessing platelets today and type and crossing her for PRBC and platelets as she is at an increased risk for bleeding complications with thrombocytopenia.  I discussed with [name] that I believe that a expiratory laparotomy would be the best approach given the persistence of disease on CT that I am seeing. I have concerns about laparoscopic entry given her hepatosplenomegaly associated with her cirrhosis. I discussed that her cirrhosis puts her at increased risk for perioperative morbidity and mortality. I discussed operative risks including bleeding, infection, damage to internal organs (such as bladder,ureters, bowels), blood clot, reoperation and rehospitalization. She desires that we pursue surgery. We will ensure that she has optimal reflexes for VTE event perioperatively as she is an increased risk with advanced ovarian cancer.  HPI: [name] is a 69 year old G1P1 who was seen in consultation at the request of Dr Ronita Hipps in October 2015 for the findings of a pelvic mass on examination, CT findings consistent with ovarian cancer and an elevated CA 125. [name] had approximately 1 year history of cough, for which she underwent workup with Dr [name], a pulmonologist at [facility]. With extensive evaluation and medication modification, she feels "a million percent better". However, she continued to have stress urinary incontinence ( a symptom she has  had for approximately 2 years). She had initially felt the SUI was associated with the cough, however, when she noticed that it did not improve with resolution of her cough, she sought further evaluation. She also reports difficulty emptying her bladder of urine (feels that after she goes she has to sit and wait a while before she can completely empty). She also notes mild pulling discomfort in the suprapubic area for 2 months.  In September 2015 she was seen by her OBGYN Dr Ronita Hipps for the persistent SUI symptoms. He performed a pelvic examination which revealed a pelvic mass. He ordered a CA 125: 1195 on 11/08/13; and a CT of the abdomen and pelvis on 11/12/13 which showed:  - A large amount of ascites present.  - Findings of progressive hepatic cirrhosis with a large amount of ascites and mild splenomegaly. There are venous collaterals present.The liver is shrunken and its surface is markedly irregular with mild splenomegaly. There prominence of mesenteric venous structures.  - The urinary bladder is partially distended and grossly normal.  - Increased peritoneal soft tissue density within the pelvis fairly symmetric from right to left. This could reflect mesenteric implants in the appropriate clinical setting. There are similar findings noted along the anterior mid and lower peritoneal surface on the left.  - No definite focal adnexal masses are demonstrated.  - There is a small left pleural effusion layering posteriorly.   The patient has had a hysterectomy for benign indications in 1988 (TAH). She has a sister, a maternal aunt and a maternal cousin with breast cancer (none have been tested for BRCA).  Of note, in 2013 she had undergone a CT to work up abnormal LFT's. This showed an  enlarged irregularly contoured liver and splenomegaly consistent with cirrhosis. A cirrhosis workup was unremarkable and her LFT's trended down, and therefore no further action was taken. She denies alcoholism,  and rarely drinks.  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  She went on to receive 4 cycles of neoadjuvant chemotherapy with paclitaxel and carboplatin q 21 days. She has tolerated therapy very well with no major dose limiting toxicities or treatment delays. Her most recent CA 125 on day 1 of cycle 4 was 130. She had required paracenteses x 3 during her first cycles of therapy to control ascites symptoms.  CT after cycle 4 (02/23/14):  1. Interval decrease in ascites. 2. Morphologic changes in the liver consistent with cirrhosis. 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 Portal vein remains patent at this time. 3. Persistent but decreased abnormal soft tissue attenuation tracking in the omental fat. This may be secondary to interval improvement in metastatic disease. 4. Peritoneal thickening seen in the left abdomen and pelvis on the previous study has decreased and nearly resolved in the interval. This also suggests interval improvement in metastatic disease.  Interval History: The patient reports improved appetite,and diet since starting chemotherapy.  Current Meds:  Outpatient Encounter Prescriptions as of 02/25/2014  Medication Sig  . albuterol (PROVENTIL HFA;VENTOLIN HFA) 108 (90 BASE) MCG/ACT inhaler Inhale 2 puffs into the lungs every 4 (four) hours as needed.   Marland Kitchen aspirin 81 MG tablet Take 81 mg by mouth daily.   . bisoprolol (ZEBETA) 5 MG tablet Take 1/2 tablet daily  . dexamethasone (DECADRON) 4 MG tablet Take 5 tabs with food(=29m) 12 hrs6 hrs prior to Taxol chemotherapy  . estradiol (ESTRACE) 1 MG tablet Take 1 mg by mouth at bedtime.   . famotidine (PEPCID) 20 MG tablet Take 20 mg by mouth at bedtime as needed for heartburn (cough).   .Marland KitchenHYDROcodone-acetaminophen (NORCO/VICODIN) 5-325 MG per  tablet Take 1-2 tablets by mouth every 6 (six) hours as needed for moderate pain.  .Marland Kitchenibuprofen (ADVIL,MOTRIN) 200 MG tablet Take 800 mg by mouth every 6 (six) hours as needed for moderate pain (pain).  .Marland Kitchenloratadine (CLARITIN) 10 MG tablet Take 10 mg by mouth daily.   .Marland KitchenLORazepam (ATIVAN) 1 MG tablet Place 1 tablet under the tongue or swallow every 6 hrs as needed for nausea or as directed  . metFORMIN (GLUCOPHAGE-XR) 500 MG 24 hr tablet Take 500 mg by mouth daily.  .Marland KitchenMICROLET LANCETS MISC Test 1 times daily. Dx 250.02  . mometasone-formoterol (DULERA) 100-5 MCG/ACT AERO Inhale 2 puffs into the lungs as needed for wheezing or shortness of breath.   . montelukast (SINGULAIR) 10 MG tablet Take 10 mg by mouth at bedtime.   . ondansetron (ZOFRAN) 8 MG tablet Take 1 tablet (8 mg total) by mouth every 8 (eight) hours as needed for nausea or vomiting.  . pantoprazole (PROTONIX) 40 MG tablet Take 40 mg by mouth daily.   . prochlorperazine (COMPAZINE) 10 MG tablet Take 1 tablet (10 mg total) by mouth every 6 (six) hours as needed for nausea or vomiting.  . sennosides-docusate sodium (SENOKOT-S) 8.6-50 MG tablet Take 1 tablet by mouth daily as needed for constipation (constipation).   .Marland Kitchensulfamethoxazole-trimethoprim (BACTRIM DS,SEPTRA DS) 800-160 MG per tablet Take 1 tablet by mouth 2 (two) times daily.  . temazepam (RESTORIL) 15 MG capsule Take 1 capsule (15 mg total) by mouth  at bedtime as needed for sleep.  . traMADol (ULTRAM) 50 MG tablet Take 1 tablet (50 mg total) by mouth every 8 (eight) hours as needed for moderate pain.    Allergy:  Allergies  Allergen Reactions  . Benadryl [Diphenhydramine Hcl] Other (See Comments)    Restless legs   . Shellfish Allergy     HER THROAT SWELLS UP AND CLOSES  . Penicillins Rash    WHEN SHE WAS TEENAGER    Social Hx:  History   Social History  . Marital Status: Single     Spouse Name: N/A    Number of Children: 1 D  . Years of Education: N/A   Occupational History  . Retired    Social History Main Topics  . Smoking status: Former Smoker -- 1.00 packs/day for 1 years    Types: Cigarettes    Quit date: 02/11/1982  . Smokeless tobacco: Current User  . Alcohol Use: Yes     Comment: socially  . Drug Use: No  . Sexual Activity: Not on file   Other Topics Concern  . Not on file   Social History Narrative  former smoker (quit in the 75's) Used to work teaching bar tending. Remote history of heavier drinking but "never a problem". She now drinks once every few months.  Past Surgical Hx:  Past Surgical History  Procedure Laterality Date  . Tonsillectomy  1971  . Abdominal ultrasound  Sept 2012    No gallbladder disease, + fatty liver  . Cardiac catheterization  10/25/2010    EF 60%; Normal coronaries  . Colonoscopy  10/18/2005  . Wh-mammography  11/24/2009  . Partial hysterectomy  1988  . Cosmetic surgery  2003    Past Medical Hx:  Past Medical History  Diagnosis Date  . Diabetes mellitus   . Asthma   . Overactive bladder   . Non-ST elevated myocardial infarction (non-STEMI) Sept 2012    Mild elevation in troponin and CK MB but no evidence of coronary disease at time of cath  . Elevated LFTs   . Obesity   . Hypertension   . Cirrhosis   . Portal hypertension   . Glaucoma, narrow-angle   . Ovarian cancer 11/22/2013    Disseminated ovarian cancer    Past Gynecological History: Hysterectomy for heavy periods (benign) in 1988. No LMP recorded. Patient has had a hysterectomy.  Family Hx:  Family History  Problem Relation Age of Onset  . Asthma Mother   . Diabetes Mother   . Hypertension Mother   . Allergies Mother   . Rheum arthritis Mother   . Bladder Cancer Father 44     former heavy smoker  . Hypertension Father   . Stroke Father   . Breast cancer Sister 31  . Diabetes Brother   . Allergies Brother   . Asthma Brother   . Diabetes Sister     x 3  . Allergies Sister   . Asthma Sister     x 2   . Breast cancer Maternal Aunt 85  . Breast cancer Cousin 29    (maternal) bilateral cancer  . Aneurysm Son 36    Review of Systems:  Constitutional  Feels well, See HPI  ENT Normal appearing ears and nares bilaterally Skin/Breast  No rash, sores, jaundice, itching, dryness Cardiovascular  No chest pain, shortness of breath, or edema  Pulmonary  No cough or wheeze.  Gastro Intestinal  No nausea, vomitting, or diarrhoea. No bright red blood per  rectum, no abdominal pain, change in bowel movement, or constipation.  Genito Urinary  No frequency, urgency, dysuria, + stress incontinence. see HPI Musculo Skeletal  + erythema and pain in distal lateral right forearm consistent with thrombophlebitis at a chemotherapy access site Neurologic  No weakness, numbness, change in gait,  Psychology  No depression, anxiety, insomnia.   Vitals: Blood pressure 141/67, pulse 66, temperature 98.4 F (36.9 C), temperature source Oral, resp. rate 16.  Physical Exam: WD in NAD Neck  Supple NROM, without any enlargements.  Lymph Node Survey No cervical supraclavicular or inguinal adenopathy Cardiovascular  Pulse normal rate, regularity and rhythm. S1 and S2 normal.  Lungs  Clear to auscultation bilateraly, without wheezes/crackles/rhonchi. Good air movement.  Skin  Erythema and edema on distal right arm  Psychiatry  Alert and oriented to person, place, and time  Abdomen  Normoactive bowel sounds, abdomen soft, non-tender and overweight without evidence of hernia. No dullness to percussion. Back No CVA tenderness Genito Urinary  Vulva/vagina: Normal external female  genitalia. No lesions. No discharge or bleeding. Bladder/urethra: No lesions or masses, well supported bladder Vagina: normal in appearance Cervix: surgically absent Uterus: surgically absent  Adnexa: no fullness or masses appreciated. Rectal  The previously appreciated substantial thickening and nodularity of the rectovaginal septum/cul de sac concerning for peritoneal metastases has resolved and is nonpalpable. Extremities  No bilateral cyanosis, clubbing or edema.   Donaciano Eva, MD

## 2014-03-29 NOTE — Anesthesia Procedure Notes (Signed)
Procedure Name: Intubation Date/Time: 03/29/2014 11:14 AM Performed by: Dione Booze Pre-anesthesia Checklist: Patient identified, Emergency Drugs available, Suction available and Patient being monitored Patient Re-evaluated:Patient Re-evaluated prior to inductionOxygen Delivery Method: Circle system utilized Preoxygenation: Pre-oxygenation with 100% oxygen Intubation Type: IV induction Laryngoscope Size: Mac and 4 Grade View: Grade II Tube type: Oral Tube size: 7.5 mm Number of attempts: 1 Airway Equipment and Method: Stylet Placement Confirmation: ETT inserted through vocal cords under direct vision,  positive ETCO2 and breath sounds checked- equal and bilateral Secured at: 21 cm Tube secured with: Tape Dental Injury: Teeth and Oropharynx as per pre-operative assessment

## 2014-03-29 NOTE — Anesthesia Preprocedure Evaluation (Signed)
Anesthesia Evaluation  Patient identified by MRN, date of birth, ID band Patient awake    Reviewed: Allergy & Precautions, NPO status , Patient's Chart, lab work & pertinent test results  History of Anesthesia Complications (+) PONV and Family history of anesthesia reaction  Airway        Dental   Pulmonary asthma , former smoker,          Cardiovascular hypertension, + Past MI     Neuro/Psych    GI/Hepatic (+) Cirrhosis -      ,   Endo/Other  diabetes, Type 2  Renal/GU      Musculoskeletal   Abdominal   Peds  Hematology  (+) anemia ,   Anesthesia Other Findings   Reproductive/Obstetrics                             Anesthesia Physical Anesthesia Plan  ASA: III  Anesthesia Plan: General   Post-op Pain Management:    Induction: Intravenous  Airway Management Planned: Oral ETT  Additional Equipment:   Intra-op Plan:   Post-operative Plan: Extubation in OR  Informed Consent: I have reviewed the patients History and Physical, chart, labs and discussed the procedure including the risks, benefits and alternatives for the proposed anesthesia with the patient or authorized representative who has indicated his/her understanding and acceptance.     Plan Discussed with: CRNA, Anesthesiologist and Surgeon  Anesthesia Plan Comments:         Anesthesia Quick Evaluation

## 2014-03-29 NOTE — Anesthesia Postprocedure Evaluation (Signed)
  Anesthesia Post-op Note  Patient: Olivia Benson  Procedure(s) Performed: Procedure(s): BILATERAL SALPINGO OOPHORECTOMY, OMENTECTOMY, DEBULKING (Bilateral) EXPLORATORY LAPAROTOMY (N/A)  Patient Location: PACU  Anesthesia Type:General  Level of Consciousness: awake, alert , oriented and patient cooperative  Airway and Oxygen Therapy: Patient Spontanous Breathing  Post-op Pain: mild  Post-op Assessment: Post-op Vital signs reviewed, Patient's Cardiovascular Status Stable, Respiratory Function Stable, Patent Airway, No signs of Nausea or vomiting and Pain level controlled  Post-op Vital Signs: stable  Last Vitals:  Filed Vitals:   03/29/14 1446  BP: 136/57  Pulse: 56  Temp: 36.6 C  Resp: 14    Complications: No apparent anesthesia complications

## 2014-03-29 NOTE — Op Note (Signed)
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 Exploratory laparotomy with bilateral salpingo-oophorectomy, omentectomy radical tumor debulking for ovarian cancer .  Surgeon: Thereasa Solo, MD.  Assistant Surgeon: Lahoma Crocker, M.D. Assistant: (an MD assistant was necessary for tissue manipulation, retraction and positioning due to the complexity of the case and hospital policies).   Specimens: Bilateral tubes / ovaries, omentum.    Estimated Blood Loss: 300 mL.    Urine [id number]  Complications: None.   Operative Findings: 10cm omental cake from hepatic to splenic flexure, densely adherent to transverse colon. Milliary studding of tumor implants (<26m, too numerous in number to count) adherent to the mesentery of the small bowel and small bowel wall. Small volume (100cc) ascites. Small ovaries bilaterally, densely adherent to the pelvic cul de sac. Left ovary and tube densely adherent to sigmoid colon. Cirrhotic liver with hepatomegaly and splenomegaly.     This represented an optimal cytoreduction (R1) with visible disease residual on the bowel wall and mesentery (millial, <355mimplants).   Procedure:   The patient was seen in the Holding Room. The risks, benefits, complications, treatment options, and expected outcomes were discussed with the patient.  The patient concurred with the proposed plan, giving informed consent.   The patient was  identified as Olivia Fieldand the procedure verified as BSO, omentectomy, tumor debulking. A Time Out was held and the above information confirmed upon entry to the operating room..  After induction of anesthesia, the patient was draped and prepped in the usual sterile manner.  She was prepped and draped in the normal sterile fashion in the dorsal lithotomy position in padded Allen stirrups with good attention paid to support of  the lower back and lower extremities. Position was adjusted for appropriate support. A Foley catheter was placed to gravity.   A midline vertical incision was made and carried through the subcutaneous tissue to the fascia. The fascial incision was made and extended superiorally. The rectus muscles were separated. The peritoneum was identified and entered. Peritoneal incision was extended longitudinally.  The abdominal cavity was entered sharply and without incident. A Bookwalter retractor was then placed. A survey of the abdomen and pelvis revealed the above findings, which were significant for the large omental cake adherent to the transverse colon and little pelvic disease, but millial small volume disease remained present in the peritoneal cavity.  The omental cake was dissected free from the transverse colon from the hepatic flexure to the splenic flexure using sharp metzenbaum scissor dissection. The lesser sac was entered. The tumor cake was separated from the mesentery of the transverse colon. The short gastric vessels were sealed with ligasure and the infragastric omentum was separated from the greater curvature of the stomach removing all bulky tumor. Hemostasis was confirmed. The colon was closely inspected and was noted to be intact and hemostatic.   After packing the small bowel into the upper abdomen, we performed a right salpingo-oophorectomy by entering the  pelvic sidewall just posterior to the right round ligament. The pararectal space was developed and the retroperitoneum developed up to the level of the common iliac artery.  The course of the ureter was identified with ease. The right IP was then skeletonized, and sealed with ligasure. The ovary was separated from its peritoneal attachments with the bovie with visualization of the ureter at all times. The ovary was separated from the vaginal cuff/cardinal ligament attatchments using  the ligasure.   The sigmoid colon was dissected from its  dense tumor attachments to the left ovary using sharp dissection. The left retroperitoneal peritoneum was entered parallel to the sigmoid colon attachments and the left ureter was identified in the left retroperitoneal space. Using sharp and monopolar dissection, the left tube and ovary were freed from their peritoneal adhesions to the pelvis and sigmoid colon. The IP ligament was sealed with ligasure. The vaginal cuff/cardinal ligament attachments were taken down with ligasure.   The peritoneal cavity was irrigated and hemostasis was confirmed at all surgical sites.  Residual tumor was present at the serosal surface of the small intestines and mesentery of the small intestines (too numerous to count nodules measuring <3m).   The fascia was reapproximated with 0 looped PDS using a total of two sutures. The subcutaneous layer was then irrigated copiously.  Exparel long acting local anesthetic was infiltrated into the subcutaneous tissues. The skin was closed with staples. The patient tolerated the procedure well.   Sponge, lap and needle counts were correct x 2.   RDonaciano Eva MD

## 2014-03-29 NOTE — Transfer of Care (Signed)
Immediate Anesthesia Transfer of Care Note  Patient: Olivia Benson  Procedure(s) Performed: Procedure(s): BILATERAL SALPINGO OOPHORECTOMY, OMENTECTOMY, DEBULKING (Bilateral) EXPLORATORY LAPAROTOMY (N/A)  Patient Location: PACU  Anesthesia Type:General  Level of Consciousness: awake, alert  and patient cooperative  Airway & Oxygen Therapy: Patient Spontanous Breathing and Patient connected to face mask oxygen  Post-op Assessment: Report given to RN and Post -op Vital signs reviewed and stable  Post vital signs: Reviewed and stable  Last Vitals:  Filed Vitals:   03/29/14 0851  BP: 123/59  Pulse: 70  Temp: 36.7 C  Resp: 18    Complications: No apparent anesthesia complications

## 2014-03-30 ENCOUNTER — Encounter (HOSPITAL_COMMUNITY): Payer: Self-pay | Admitting: Gynecologic Oncology

## 2014-03-30 LAB — BASIC METABOLIC PANEL
Anion gap: 9 (ref 5–15)
BUN: 12 mg/dL (ref 6–23)
CO2: 26 mmol/L (ref 19–32)
Calcium: 8.9 mg/dL (ref 8.4–10.5)
Chloride: 105 mmol/L (ref 96–112)
Creatinine, Ser: 0.77 mg/dL (ref 0.50–1.10)
GFR calc Af Amer: >90 mL/min (ref >90)
GFR calc non Af Amer: 86 mL/min — ABNORMAL LOW (ref >90)
Glucose, Bld: 158 mg/dL — ABNORMAL HIGH (ref 70–99)
Potassium: 4.5 mmol/L (ref 3.5–5.1)
Sodium: 140 mmol/L (ref 135–145)

## 2014-03-30 LAB — CBC
HCT: 31.8 % — ABNORMAL LOW (ref 36.0–46.0)
Hemoglobin: 10.3 g/dL — ABNORMAL LOW (ref 12.0–15.0)
MCH: 30.7 pg (ref 26.0–34.0)
MCHC: 32.4 g/dL (ref 30.0–36.0)
MCV: 94.6 fL (ref 78.0–100.0)
Platelets: 166 10*3/uL (ref 150–400)
RBC: 3.36 MIL/uL — ABNORMAL LOW (ref 3.87–5.11)
RDW: 17.4 % — ABNORMAL HIGH (ref 11.5–15.5)
WBC: 11.1 10*3/uL — ABNORMAL HIGH (ref 4.0–10.5)

## 2014-03-30 LAB — GLUCOSE, CAPILLARY
Glucose-Capillary: 178 mg/dL — ABNORMAL HIGH (ref 70–99)
Glucose-Capillary: 118 mg/dL — ABNORMAL HIGH (ref 70–99)
Glucose-Capillary: 147 mg/dL — ABNORMAL HIGH (ref 70–99)
Glucose-Capillary: 96 mg/dL (ref 70–99)
Glucose-Capillary: 132 mg/dL — ABNORMAL HIGH (ref 70–99)

## 2014-03-30 MED ORDER — TRAMADOL HCL 50 MG PO TABS
50.0000 mg | ORAL_TABLET | Freq: Four times a day (QID) | ORAL | Status: DC | PRN
  Administered 2014-03-30: 50 mg via ORAL
  Filled 2014-03-30: qty 1

## 2014-03-30 MED ORDER — HYDROMORPHONE HCL 1 MG/ML IJ SOLN: 0.3000 mg | INTRAMUSCULAR | Status: DC | PRN

## 2014-03-30 MED ORDER — IBUPROFEN 800 MG PO TABS
800.0000 mg | ORAL_TABLET | Freq: Three times a day (TID) | ORAL | Status: DC
  Administered 2014-03-30 – 2014-04-01 (×7): 800 mg via ORAL
  Filled 2014-03-30 (×9): qty 1

## 2014-03-30 MED ORDER — SODIUM CHLORIDE 0.9 % IJ SOLN
10.0000 mL | INTRAMUSCULAR | Status: DC | PRN
  Administered 2014-03-30 – 2014-04-01 (×3): 10 mL
  Filled 2014-03-30 (×2): qty 40

## 2014-03-30 NOTE — Progress Notes (Signed)
1 Day Post-Op Procedure(s) (LRB): BILATERAL SALPINGO OOPHORECTOMY, OMENTECTOMY, DEBULKING (Bilateral) EXPLORATORY LAPAROTOMY (N/A)  Subjective: Patient reports pain well controlled on PCA, no nausea, no complaints, no flatus.    Objective: Vital signs in last 24 hours: Temp:  [97.4 F (36.3 C)-98.2 F (36.8 C)] 98.2 F (36.8 C) (02/17 0541) Pulse Rate:  [54-67] 66 (02/17 0541) Resp:  [10-20] 16 (02/17 0541) BP: (111-138)/(48-82) 128/55 mmHg (02/17 0541) SpO2:  [95 %-100 %] 100 % (02/17 0541) Last BM Date: 03/28/14  Intake/Output from previous day: 02/16 0701 - 02/17 0700 In: 3554.2 [I.V.:3554.2] Out: 3450 [Urine:3100; Emesis/NG output:50; Blood:300]  Physical Examination: General: alert and cooperative Resp: clear to auscultation bilaterally Cardio: regular rate and rhythm, S1, S2 normal, no murmur, click, rub or gallop GI: soft, non-tender; bowel sounds normal; no masses,  no organomegaly and incision: clean, dry and intact Extremities: extremities normal, atraumatic, no cyanosis or edema Vaginal Bleeding: none  Labs: WBC/Hgb/Hct/Plts:  11.1/10.3/31.8/166 (02/17 9794) BUN/Cr/glu/ALT/AST/amyl/lip:  12/0.77/--/--/--/--/-- (02/17 8016)   Assessment:  69 y.o. s/p Procedure(s): BILATERAL SALPINGO OOPHORECTOMY, OMENTECTOMY, DEBULKING EXPLORATORY LAPAROTOMY: stable Pain:  Pain is well-controlled on PCA. Turn off PCA when patient is tolerating PO and utilize oral pain meds only.  Heme:appropriate postop Hb for EBL.  ID: no signs of infection  CV: Hypotension: well controlled   GI:  Tolerating po: No: has not yet attempted- I removed NGT, will write for regular diet.  FEN: TKO IVF's until PCA gone, then d.c IVF.  Prophylaxis: pharmacologic prophylaxis (with any of the following: enoxaparin (Lovenox) 51m SQ 2 hours prior to surgery then every day).  Plan: Advance diet Encourage ambulation Advance to PO medication Discontinue IV fluids Dispo:   The patient is to be  discharged to home when meeting criteria (likely in 24-48 hours).   LOS: 1 day    RDonaciano Eva2/17/2016, 9:05 AM

## 2014-03-31 LAB — GLUCOSE, CAPILLARY
Glucose-Capillary: 101 mg/dL — ABNORMAL HIGH (ref 70–99)
Glucose-Capillary: 115 mg/dL — ABNORMAL HIGH (ref 70–99)
Glucose-Capillary: 115 mg/dL — ABNORMAL HIGH (ref 70–99)
Glucose-Capillary: 124 mg/dL — ABNORMAL HIGH (ref 70–99)
Glucose-Capillary: 78 mg/dL (ref 70–99)
Glucose-Capillary: 93 mg/dL (ref 70–99)
Glucose-Capillary: 93 mg/dL (ref 70–99)

## 2014-03-31 MED ORDER — OXYCODONE-ACETAMINOPHEN 5-325 MG PO TABS
1.0000 | ORAL_TABLET | ORAL | Status: DC | PRN
  Administered 2014-03-31 – 2014-04-01 (×3): 2 via ORAL
  Filled 2014-03-31 (×3): qty 2

## 2014-03-31 MED ORDER — ENOXAPARIN (LOVENOX) PATIENT EDUCATION KIT
PACK | Freq: Once | Status: DC
  Filled 2014-03-31 (×2): qty 1

## 2014-03-31 MED ORDER — PANTOPRAZOLE SODIUM 40 MG PO TBEC
40.0000 mg | DELAYED_RELEASE_TABLET | Freq: Every day | ORAL | Status: DC
  Administered 2014-03-31: 40 mg via ORAL
  Filled 2014-03-31 (×2): qty 1

## 2014-03-31 MED ORDER — METFORMIN HCL ER 500 MG PO TB24
500.0000 mg | ORAL_TABLET | Freq: Every day | ORAL | Status: DC
  Administered 2014-03-31 – 2014-04-01 (×2): 500 mg via ORAL
  Filled 2014-03-31 (×3): qty 1

## 2014-03-31 MED ORDER — INSULIN ASPART 100 UNIT/ML ~~LOC~~ SOLN: 0.0000 [IU] | Freq: Three times a day (TID) | SUBCUTANEOUS | Status: DC

## 2014-03-31 NOTE — Progress Notes (Signed)
Key Points: Use following P&T approved IV to PO change policy.  Description contains the criteria that are approved Note: Policy Excludes:  Esophagectomy patientsPHARMACIST - PHYSICIAN COMMUNICATION DR:   Delsa Sale CONCERNING: IV to Oral Route Change Policy  RECOMMENDATION: This patient is receiving protonix by the intravenous route.  Based on criteria approved by the Pharmacy and Therapeutics Committee, the intravenous medication(s) is/are being converted to the equivalent oral dose form(s).   DESCRIPTION: These criteria include:  The patient is eating (either orally or via tube) and/or has been taking other orally administered medications for a least 24 hours  The patient has no evidence of active gastrointestinal bleeding or impaired GI absorption (gastrectomy, short bowel, patient on TNA or NPO).  If you have questions about this conversion, please contact the Pharmacy Department  []   [phone] )  [facility] []   [phone] )  [facility]  []   [phone] )  [facility] []   [phone] )  [place] 03/31/2014, 1:11 PM Pager [phone]

## 2014-03-31 NOTE — Progress Notes (Signed)
2 Days Post-Op Procedure(s) (LRB): BILATERAL SALPINGO OOPHORECTOMY, OMENTECTOMY, DEBULKING (Bilateral) EXPLORATORY LAPAROTOMY (N/A)  Subjective: Patient reports doing well this am.  Diet tolerated with no nausea or emesis.  Ambulating without difficulty.  Voiding without difficulty as well.  Pain minimal.  Stating tramadol does not work for her.  Agreeable with trying oxycodone/APAP PRN.  Denies chest pain, dyspnea, passing flatus, or having a bowel movement.  She would like to have her port de-accessed so she can move around without the IV pump and would also like to hold off on using a dulcolax suppository to stimulate her bowels.  No concerns voiced.  Objective: Vital signs in last 24 hours: Temp:  [97.4 F (36.3 C)-98.6 F (37 C)] 97.4 F (36.3 C) (02/18 0815) Pulse Rate:  [61-72] 72 (02/18 0815) Resp:  [16-19] 18 (02/18 0815) BP: (101-128)/(48-71) 120/59 mmHg (02/18 0815) SpO2:  [95 %-100 %] 96 % (02/18 0815) FiO2 (%):  [42 %] 42 % (02/18 0758) Last BM Date: 03/28/14  Intake/Output from previous day: 02/17 0701 - 02/18 0700 In: 1038.4 [P.O.:440; I.V.:598.4] Out: 1500 [Urine:1500]  Physical Examination: General: alert, cooperative and no distress Resp: clear to auscultation bilaterally Cardio: regular rate and rhythm, S1, S2 normal, no murmur, click, rub or gallop GI: soft, non-tender; bowel sounds normal; no masses,  no organomegaly and incision: midline incision dressing removed, staples midline without erythema or drainage Extremities: extremities normal, atraumatic, no cyanosis or edema  Assessment: 69 y.o. s/p Procedure(s): BILATERAL SALPINGO OOPHORECTOMY, OMENTECTOMY, DEBULKING EXPLORATORY LAPAROTOMY: stable Pain:  Pain is well-controlled on PRN medications.  Heme: Stable post-operatively.  CBC on 03/30/14.  CV: BP and HR stable post-operatively.  Continue monitoring with ordered vital signs.  GI:  Tolerating po: Yes     GU:  Adequate output reported.    FEN:  Stable post-operatively.  Last Bmet 03/30/14.  Endo:  Diabetes, Type 2.  CBG (last 3)   Recent Labs  03/31/14 0006 03/31/14 0414 03/31/14 0811  GLUCAP 115* 93 78    Prophylaxis: pharmacologic prophylaxis (with any of the following: enoxaparin (Lovenox) 61m SQ 2 hours prior to surgery then every day) and intermittent pneumatic compression boots.  Plan: De-access port-a-cath (no longer needed) Percocet for pain PRN Encourage ambulation, IS use, deep breathing, and coughing Continue post-operative plan of care per Dr. JDelsa Sale   LOS: 2 days    Jalonda Antigua DEAL 03/31/2014, 11:00 AM

## 2014-04-01 LAB — TYPE AND SCREEN
ABO/RH(D): O NEG
Antibody Screen: NEGATIVE
Unit division: 0
Unit division: 0

## 2014-04-01 LAB — GLUCOSE, CAPILLARY: Glucose-Capillary: 101 mg/dL — ABNORMAL HIGH (ref 70–99)

## 2014-04-01 MED ORDER — HEPARIN SOD (PORK) LOCK FLUSH 100 UNIT/ML IV SOLN
500.0000 [IU] | INTRAVENOUS | Status: AC | PRN
  Administered 2014-04-01: 500 [IU]

## 2014-04-01 MED ORDER — OXYCODONE HCL 5 MG PO TABS: 5.0000 mg | ORAL_TABLET | ORAL | Status: DC | PRN

## 2014-04-01 MED ORDER — ENOXAPARIN SODIUM 40 MG/0.4ML ~~LOC~~ SOLN: 40.0000 mg | SUBCUTANEOUS | Status: DC

## 2014-04-01 NOTE — Progress Notes (Signed)
Discharge instructions given to pt with all questions answered. Lovenox education provided with verbal teach back. Pt accepted education well.

## 2014-04-01 NOTE — Progress Notes (Signed)
3 Days Post-Op Procedure(s) (LRB): BILATERAL SALPINGO OOPHORECTOMY, OMENTECTOMY, DEBULKING (Bilateral) EXPLORATORY LAPAROTOMY (N/A)  Subjective: Patient reports doing well this am.  Diet tolerated with no nausea or emesis.  Ambulating without difficulty.  Voiding without difficulty as well.  Pain minimal.  No flatus as yet.  Objective: Vital signs in last 24 hours: Temp:  [97.7 F (36.5 C)-98 F (36.7 C)] 97.9 F (36.6 C) (02/19 0620) Pulse Rate:  [62-64] 62 (02/19 0620) Resp:  [16] 16 (02/19 0620) BP: (109-131)/(47-65) 120/55 mmHg (02/19 0620) SpO2:  [96 %-100 %] 96 % (02/19 0620) Last BM Date: 03/28/14  Intake/Output from previous day: 02/18 0701 - 02/19 0700 In: 1360 [P.O.:1320; I.V.:40] Out: 1200 [Urine:1200]  Physical Examination: General: alert, cooperative and no distress Resp: clear to auscultation bilaterally Cardio: regular rate and rhythm, S1, S2 normal, no murmur, click, rub or gallop GI: soft, non-tender; bowel sounds normal; no masses,  no organomegaly and incision: midline incision dressing removed, staples midline without erythema or drainage Extremities: extremities normal, atraumatic, no cyanosis or edema  Assessment: 69 y.o. s/p Procedure(s): BILATERAL SALPINGO OOPHORECTOMY, OMENTECTOMY, DEBULKING EXPLORATORY LAPAROTOMY: stable Pain:  Pain is well-controlled on PRN medications.  Heme: Stable post-operatively.  CBC on 03/30/14.  CV: BP and HR stable post-operatively.  Continue monitoring with ordered vital signs.  GI:  Tolerating po: Yes     GU:  Adequate output reported.    FEN: Stable post-operatively.  Last Bmet 03/30/14.  Endo:  Diabetes, Type 2.  CBG (last 3)   Recent Labs  03/31/14 1802 03/31/14 2201 04/01/14 0730  GLUCAP 101* 124* 101*    Prophylaxis: pharmacologic prophylaxis (with any of the following: enoxaparin (Lovenox) 69m SQ 2 hours prior to surgery then every day) and intermittent pneumatic compression  boots.  Plan: De-access port-a-cath (no longer needed) Percocet for pain PRN Encourage ambulation, IS use, deep breathing, and coughing Discharge to home today. Followup in 1 week for staple removal. Prophylactic lovenox x 28 days postop.    LOS: 3 days    RDonaciano Eva2/19/2016, 11:58 AM

## 2014-04-01 NOTE — Discharge Summary (Signed)
Physician Discharge Summary  Patient ID: Olivia Benson MRN: [id number] DOB/AGE: 67-Apr-1947 67 y.o.  Admit date: 03/29/2014 Discharge date: 04/01/2014  Admission Diagnoses: Disseminated ovarian cancer  Discharge Diagnoses:  Principal Problem:   Disseminated ovarian cancer Active Problems:   Ovarian cancer   Discharged Condition:  The patient is in good condition and stable for discharge.   Hospital Course: On 03/29/2014, the patient underwent the following: Procedure(s): BILATERAL SALPINGO OOPHORECTOMY, OMENTECTOMY, DEBULKING EXPLORATORY LAPAROTOMY.  The postoperative course was uneventful.  She was discharged to home on postoperative day 3 tolerating a regular diet.  Consults: None  Significant Diagnostic Studies: None  Treatments: surgery: see above  Discharge Exam: Blood pressure 120/55, pulse 62, temperature 97.9 F (36.6 C), temperature source Oral, resp. rate 16, height 5' 1"  (1.549 m), weight 175 lb (79.379 kg), SpO2 96 %. General appearance: alert, cooperative and no distress Resp: clear to auscultation bilaterally Cardio: regular rate and rhythm, S1, S2 normal, no murmur, click, rub or gallop GI: soft, non-tender; bowel sounds normal; no masses,  no organomegaly Extremities: extremities normal, atraumatic, no cyanosis or edema Incision/Wound: Midline incision with staples, no drainage or erythema  Disposition: 01-Home or Self Care  Discharge Instructions    Call MD for:  difficulty breathing, headache or visual disturbances    Complete by:  As directed      Call MD for:  extreme fatigue    Complete by:  As directed      Call MD for:  hives    Complete by:  As directed      Call MD for:  persistant dizziness or light-headedness    Complete by:  As directed      Call MD for:  persistant nausea and vomiting    Complete by:  As directed      Call MD for:  redness, tenderness, or signs of infection (pain, swelling, redness, odor or green/yellow discharge around  incision site)    Complete by:  As directed      Call MD for:  severe uncontrolled pain    Complete by:  As directed      Call MD for:  temperature >100.4    Complete by:  As directed      De-access Port A Cath    Complete by:  As directed      Diet - low sodium heart healthy    Complete by:  As directed      Driving Restrictions    Complete by:  As directed   No driving for 2 weeks.  Do not take narcotics and drive.     Increase activity slowly    Complete by:  As directed      Lifting restrictions    Complete by:  As directed   No lifting greater than 10 lbs.     Sexual Activity Restrictions    Complete by:  As directed   No sexual activity, nothing in the vagina, for 6 weeks.            Medication List    STOP taking these medications        HYDROcodone-acetaminophen 5-325 MG per tablet  Commonly known as:  NORCO/VICODIN      TAKE these medications        albuterol 108 (90 BASE) MCG/ACT inhaler  Commonly known as:  PROVENTIL HFA;VENTOLIN HFA  Inhale 2 puffs into the lungs every 4 (four) hours as needed for wheezing or shortness of breath.  aspirin 81 MG tablet  Take 81 mg by mouth daily.     bisoprolol 5 MG tablet  Commonly known as:  ZEBETA  Take 1/2 tablet daily     dexamethasone 4 MG tablet  Commonly known as:  DECADRON  Take 5 tabs with food(=55m) 12 hrs6 hrs prior to Taxol chemotherapy     enoxaparin 40 MG/0.4ML injection  Commonly known as:  LOVENOX  Inject 0.4 mLs (40 mg total) into the skin daily.     estradiol 1 MG tablet  Commonly known as:  ESTRACE  Take 1 mg by mouth every other day.     famotidine 20 MG tablet  Commonly known as:  PEPCID  Take 20 mg by mouth at bedtime as needed for heartburn (cough).     ibuprofen 200 MG tablet  Commonly known as:  ADVIL,MOTRIN  Take 800 mg by mouth every 6 (six) hours as needed for moderate pain (pain).     lidocaine-prilocaine cream  Commonly known as:  EMLA  Apply 1 application topically as  needed. Apply 1-2 hours [facility] to [facility] as directed.     loratadine 10 MG tablet  Commonly known as:  CLARITIN  Take 10 mg by mouth daily as needed (Only takes when getting chemo treatment).     LORazepam 1 MG tablet  Commonly known as:  ATIVAN  Place 1 tablet under the tongue or swallow every 6 hrs as needed for nausea or as directed     metFORMIN 500 MG 24 hr tablet  Commonly known as:  GLUCOPHAGE-XR  Take 500 mg by mouth daily.     mometasone-formoterol 100-5 MCG/ACT Aero  Commonly known as:  DULERA  Inhale 2 puffs into the lungs as needed for wheezing or shortness of breath.     montelukast 10 MG tablet  Commonly known as:  SINGULAIR  Take 10 mg by mouth at bedtime.     ondansetron 8 MG tablet  Commonly known as:  ZOFRAN  Take 1 tablet (8 mg total) by mouth every 8 (eight) hours as needed for nausea or vomiting.     oxyCODONE 5 MG immediate release tablet  Commonly known as:  Oxy IR/ROXICODONE  Take 1-2 tablets (5-10 mg total) by mouth every 4 (four) hours as needed for severe pain.     pantoprazole 40 MG tablet  Commonly known as:  PROTONIX  Take 40 mg by mouth daily.     prochlorperazine 10 MG tablet  Commonly known as:  COMPAZINE  Take 1 tablet (10 mg total) by mouth every 6 (six) hours as needed for nausea or vomiting.     sennosides-docusate sodium 8.6-50 MG tablet  Commonly known as:  SENOKOT-S  Take 1 tablet by mouth daily as needed for constipation (constipation).     temazepam 15 MG capsule  Commonly known as:  RESTORIL  Take 1 capsule (15 mg total) by mouth at bedtime as needed for sleep.     traMADol 50 MG tablet  Commonly known as:  ULTRAM  Take 1 tablet (50 mg total) by mouth every 8 (eight) hours as needed for moderate pain.           Follow-up Information    Follow up with Tangie Stay DEAL, NP On 04/06/2014.   Specialty:  Gynecologic Oncology   [facility]   Contact information:    [place] [phone]  Follow up with Donaciano Eva, MD On 04/11/2014.   Specialty:  Obstetrics and Gynecology   Why:  at 10:30am at the [facility] for post-op follow up   Contact information:   [address] [place] [address] [phone]       Greater than thirty minutes were spend for face to face discharge instructions and discharge orders/summary in EPIC.   Signed: Asami Lambright DEAL 04/01/2014, 10:09 AM

## 2014-04-01 NOTE — Discharge Instructions (Addendum)
04/01/2014  Return to work: 4-6 weeks if applicable  Activity: 1. Be up and out of the bed during the day.  Take a nap if needed.  You may walk up steps but be careful and use the hand rail.  Stair climbing will tire you more than you think, you may need to stop part way and rest.   2. No lifting or straining for 6 weeks.  3. No driving for 2 week(s).  Do not drive if you are taking narcotic pain medicine.  4. Shower daily.  Use soap and water on your incision and pat dry; don't rub.  No tub baths until cleared by your surgeon.   5. No sexual activity and nothing in the vagina for 6 weeks.  Diet: 1. Low sodium Heart Healthy Diet is recommended.  2. It is safe to use a laxative, such as Miralax or Colace, if you have difficulty moving your bowels.   Wound Care: 1. Keep clean and dry.  Shower daily.  Reasons to call the Doctor:  Fever - Oral temperature greater than 100.4 degrees Fahrenheit  Foul-smelling vaginal discharge  Difficulty urinating  Nausea and vomiting  Increased pain at the site of the incision that is unrelieved with pain medicine.  Difficulty breathing with or without chest pain  New calf pain especially if only on one side  Sudden, continuing increased vaginal bleeding with or without clots.   Contacts: For questions or concerns you should contact:  Dr. Everitt Amber at [phone]  Joylene John, NP at [phone]  After Hours: call [phone] and ask for the GYN Oncologist on call   Enoxaparin injection What is this medicine? ENOXAPARIN (ee nox a PA rin) is used after knee, hip, or abdominal surgeries to prevent blood clotting. It is also used to treat existing blood clots in the lungs or in the veins. This medicine may be used for other purposes; ask your health care provider or pharmacist if you have questions. COMMON BRAND NAME(S): Lovenox What should I tell my health care provider before I take this medicine? They need to know if you  have any of these conditions: -bleeding disorders, hemorrhage, or hemophilia -infection of the heart or heart valves -kidney or liver disease -previous stroke -prosthetic heart valve -recent surgery or delivery of a baby -ulcer in the stomach or intestine, diverticulitis, or other bowel disease -an unusual or allergic reaction to enoxaparin, heparin, pork or pork products, other medicines, foods, dyes, or preservatives -pregnant or trying to get pregnant -breast-feeding How should I use this medicine? This medicine is for injection under the skin. It is usually given by a health-care professional. You or a family member may be trained on how to give the injections. If you are to give yourself injections, make sure you understand how to use the syringe, measure the dose if necessary, and give the injection. To avoid bruising, do not rub the site where this medicine has been injected. Do not take your medicine more often than directed. Do not stop taking except on the advice of your doctor or health care professional. Make sure you receive a puncture-resistant container to dispose of the needles and syringes once you have finished with them. Do not reuse these items. Return the container to your doctor or health care professional for proper disposal. Talk to your pediatrician regarding the use of this medicine in children. Special care may be needed. Overdosage: If you think you have taken too much of this medicine contact a poison  control center or emergency room at once. NOTE: This medicine is only for you. Do not share this medicine with others. What if I miss a dose? If you miss a dose, take it as soon as you can. If it is almost time for your next dose, take only that dose. Do not take double or extra doses. What may interact with this medicine? -aspirin and aspirin-like medicines -certain medicines that treat or prevent blood clots -dipyridamole -NSAIDs, medicines for pain and  inflammation, like ibuprofen or naproxen This list may not describe all possible interactions. Give your health care provider a list of all the medicines, herbs, non-prescription drugs, or dietary supplements you use. Also tell them if you smoke, drink alcohol, or use illegal drugs. Some items may interact with your medicine. What should I watch for while using this medicine? Visit your doctor or health care professional for regular checks on your progress. Your condition will be monitored carefully while you are receiving this medicine. Notify your doctor or health care professional and seek emergency treatment if you develop breathing problems; changes in vision; chest pain; severe, sudden headache; pain, swelling, warmth in the leg; trouble speaking; sudden numbness or weakness of the face, arm, or leg. These can be signs that your condition has gotten worse. If you are going to have surgery, tell your doctor or health care professional that you are taking this medicine. Do not stop taking this medicine without first talking to your doctor. Be sure to refill your prescription before you run out of medicine. Avoid sports and activities that might cause injury while you are using this medicine. Severe falls or injuries can cause unseen bleeding. Be careful when using sharp tools or knives. Consider using an Copy. Take special care brushing or flossing your teeth. Report any injuries, bruising, or red spots on the skin to your doctor or health care professional. What side effects may I notice from receiving this medicine? Side effects that you should report to your doctor or health care professional as soon as possible: -allergic reactions like skin rash, itching or hives, swelling of the face, lips, or tongue -feeling faint or lightheaded, falls -signs and symptoms of bleeding such as bloody or black, tarry stools; red or dark-brown urine; spitting up blood or brown material that looks like  coffee grounds; red spots on the skin; unusual bruising or bleeding from the eye, gums, or nose Side effects that usually do not require medical attention (report to your doctor or health care professional if they continue or are bothersome): -pain, redness, or irritation at site where injected This list may not describe all possible side effects. Call your doctor for medical advice about side effects. You may report side effects to FDA at 1-800-FDA-1088. Where should I keep my medicine? Keep out of the reach of children. Store at room temperature between 15 and 30 degrees C (59 and 86 degrees F). Do not freeze. If your injections have been specially prepared, you may need to store them in the refrigerator. Ask your pharmacist. Throw away any unused medicine after the expiration date. NOTE: This sheet is a summary. It may not cover all possible information. If you have questions about this medicine, talk to your doctor, pharmacist, or health care provider.  2015, [employer]. (2013-06-01 16:06:21)  Enoxaparin, Home Use Enoxaparin (Lovenox) injection is a medication used to prevent clots from developing in your veins. Medications such as enoxaparin are called blood thinners or anticoagulants. If blood clots are  untreated they could travel to your lungs. This is called a pulmonary embolus. A blood clot in your lungs can be fatal. Caregivers often use anticoagulants such as enoxaparin to prevent clots following surgery. It is also used along with aspirin when the heart is not getting enough blood. Continue the enoxaparin injections as directed by your caregiver. Your caregiver will use blood clotting test results to decide when you can safely stop using enoxaparin injections. If your caregiver prescribes any additional anticoagulant, you must take it exactly as directed. RISKS AND COMPLICATIONS  If you have received recent epidural anesthesia, spinal anesthesia, or a spinal tap while  receiving anticoagulants, you are at risk for developing a blood clot in or around the spine. This condition could result in long-term or permanent paralysis.  Because anticoagulants thin your blood, severe bleeding may occur from any tissue or organ. Symptoms of the blood being too thin may include:  Bleeding from the nose or gums that does not stop quickly.  Unusual bruising or bruising easily.  Swelling or pain at an injection site.  A cut that does not stop bleeding within 10 minutes.  Continual nausea for more than 1 day or vomiting blood.  Coughing up blood.  Blood in the urine which may appear as pink, red, or brown urine.  Blood in bowel movements which may appear as red, dark or black stools.  Sudden weakness or numbness of the face, arm, or leg, especially on one side of the body.  Sudden confusion.  Trouble speaking (aphasia) or understanding.  Sudden trouble seeing in one or both eyes.  Sudden trouble walking.  Dizziness.  Loss of balance or coordination.  Severe pain, such as a headache, joint pain, or back pain.  Fever.  Bruising around the injection sites may be expected.  Platelet drops, known as "thrombocytopenia," can occur with enoxaparin use. A condition called "heparin-induced thrombocytopenia" has been seen. If you have had this condition, you should tell your caregiver. Your caregiver may direct you to have blood tests to monitor this condition.  Do not use if you have allergies to the medication, heparin, or pork products.  Other side effects may include mild local reactions or irritation at the site of injection, pain, bruising, and redness of skin. HOME CARE INSTRUCTIONS You will be instructed by your caregiver how to give enoxaparin injections. 1. Before giving your medication you should make sure the injection is a clear and colorless or pale yellow solution. If your medication becomes discolored or has particles in the bottle, do not use and  notify your caregiver. 2. When using the 30 and 40 mg pre-filled syringes, do not expel the air bubble from the syringe before the injection. This makes sure you use all the medication in the syringe. 3. The injections will be given subcutaneously. This means it is given into the fat over the belly (abdomen). It is given deep beneath the skin but not into the muscle. The shots should be injected around the abdominal wall. Change the sites of injection each time. The whole length of the needle should be introduced into a skin fold held between the thumb and forefinger; the skin fold should be held throughout the injection. Do not rub the injection site after completion of the injection. This increases bruising. Enoxaparin injection pre-filled syringes and graduated pre-filled syringes are available with a system that shields the needle after injection. 4. Inject by pushing the plunger to the bottom of the syringe. 5. Remove the syringe  from the injection site keeping your finger on the plunger rod. Be careful not to stick yourself or others. 6. After injection and the syringe is empty, set off the safety system by firmly pushing the plunger rod. The protective sleeve will automatically cover the needle and you can hear a click. The click means your needle is safely covered. Do not try replacing the needle shield. 7. Get rid of the syringe in the nearest sharps container. 8. Keep your medication safely stored at room temperatures.  Due to the complications of anticoagulants, it is very important that you take your anticoagulant as directed by your caregiver. Anticoagulants need to be taken exactly as instructed. Be sure you understand all your anticoagulant instructions.  Changes in medicines, supplements, diet, and illness can affect your anticoagulation therapy. Be sure to inform your caregivers of any of these changes.  While on anticoagulants, you will need to have blood tests done routinely as  directed by your caregivers.  Be careful not to cut yourself when using sharp objects.  Limit physical activities or sports that could result in a fall or cause injury.  It is extremely important that you tell all of your caregivers and dentist that you are taking an anticoagulant, especially if you are injured or plan to have any type of procedure or operation.  Follow up with your laboratory test and caregiver appointments as directed. It is very important to keep your appointments. Not keeping appointments could result in a chronic or permanent injury, pain, or disability. SEEK MEDICAL CARE IF:  You develop any rashes.  You have any worsening of the condition for which you are receiving anticoagulation therapy. SEEK IMMEDIATE MEDICAL CARE IF:  Bleeding from the nose or gums does not stop quickly.  You have unusual bruising or are bruising easily.  Swelling or pain occurs at an injection site.  A cut does not stop bleeding within 10 minutes.  You have continual nausea for more than 1 day or are vomiting blood.  You are coughing up blood.  You have blood in the urine.  You have dark or black stools.  You have sudden weakness or numbness of the face, arm, or leg, especially on one side of the body.  You have sudden confusion.  You have trouble speaking (aphasia) or understanding.  You have sudden trouble seeing in one or both eyes.  You have sudden trouble walking.  You have dizziness.  You have a loss of balance or coordination.  You have severe pain, such as a headache, joint pain, or back pain.  You have a serious fall or head injury, even if you are not bleeding.  You have an oral temperature above 102 F (38.9 C), not controlled by medicine. ANY OF THESE SYMPTOMS MAY REPRESENT A SERIOUS PROBLEM THAT IS AN EMERGENCY. Do not wait to see if the symptoms will go away. Get medical help right away. Call your local emergency services (911 in [place] DO NOT drive  yourself to the hospital. MAKE SURE YOU:  Understand these instructions.  Will watch your condition.  Will get help right away if you are not doing well or get worse. Document Released: 11/30/2003 Document Revised: 04/22/2011 Document Reviewed: 01/28/2005 [facility] Patient Information 2015 [place], [place]. This information is not intended to replace advice given to you by your health care provider. Make sure you discuss any questions you have with your health care provider.   Oxycodone tablets or capsules What is this medicine? OXYCODONE (ox i  KOE done) is a pain reliever. It is used to treat moderate to severe pain. This medicine may be used for other purposes; ask your health care provider or pharmacist if you have questions. COMMON BRAND NAME(S): Dazidox, Endocodone, OXECTA, OxyIR, Percolone, Roxicodone What should I tell my health care provider before I take this medicine? They need to know if you have any of these conditions: -Addison's disease -brain tumor -drug abuse or addiction -head injury -heart disease -if you frequently drink alcohol containing drinks -kidney disease or problems going to the bathroom -liver disease -lung disease, asthma, or breathing problems -mental problems -an unusual or allergic reaction to oxycodone, codeine, hydrocodone, morphine, other medicines, foods, dyes, or preservatives -pregnant or trying to get pregnant -breast-feeding How should I use this medicine? Take this medicine by mouth with a glass of water. Follow the directions on the prescription label. You can take it with or without food. If it upsets your stomach, take it with food. Take your medicine at regular intervals. Do not take it more often than directed. Do not stop taking except on your doctor's advice. Some brands of this medicine, like Oxecta, have special instructions. Ask your doctor or pharmacist if these directions are for you: Do not cut, crush or chew this medicine. Swallow  only one tablet at a time. Do not wet, soak, or lick the tablet before you take it. Talk to your pediatrician regarding the use of this medicine in children. Special care may be needed. Overdosage: If you think you have taken too much of this medicine contact a poison control center or emergency room at once. NOTE: This medicine is only for you. Do not share this medicine with others. What if I miss a dose? If you miss a dose, take it as soon as you can. If it is almost time for your next dose, take only that dose. Do not take double or extra doses. What may interact with this medicine? -alcohol -antihistamines -certain medicines used for nausea like chlorpromazine, droperidol -erythromycin -ketoconazole -medicines for depression, anxiety, or psychotic disturbances -medicines for sleep -muscle relaxants -naloxone -naltrexone -narcotic medicines (opiates) for pain -nilotinib -phenobarbital -phenytoin -rifampin -ritonavir -voriconazole This list may not describe all possible interactions. Give your health care provider a list of all the medicines, herbs, non-prescription drugs, or dietary supplements you use. Also tell them if you smoke, drink alcohol, or use illegal drugs. Some items may interact with your medicine. What should I watch for while using this medicine? Tell your doctor or health care professional if your pain does not go away, if it gets worse, or if you have new or a different type of pain. You may develop tolerance to the medicine. Tolerance means that you will need a higher dose of the medicine for pain relief. Tolerance is normal and is expected if you take this medicine for a long time. Do not suddenly stop taking your medicine because you may develop a severe reaction. Your body becomes used to the medicine. This does NOT mean you are addicted. Addiction is a behavior related to getting and using a drug for a non-medical reason. If you have pain, you have a medical  reason to take pain medicine. Your doctor will tell you how much medicine to take. If your doctor wants you to stop the medicine, the dose will be slowly lowered over time to avoid any side effects. You may get drowsy or dizzy when you first start taking this medicine or change doses. Do  not drive, use machinery, or do anything that may be dangerous until you know how the medicine affects you. Stand or sit up slowly. There are different types of narcotic medicines (opiates) for pain. If you take more than one type at the same time, you may have more side effects. Give your health care provider a list of all medicines you use. Your doctor will tell you how much medicine to take. Do not take more medicine than directed. Call emergency for help if you have problems breathing. This medicine will cause constipation. Try to have a bowel movement at least every 2 to 3 days. If you do not have a bowel movement for 3 days, call your doctor or health care professional. Your mouth may get dry. Drinking water, chewing sugarless gum, or sucking on hard candy may help. See your dentist every 6 months. What side effects may I notice from receiving this medicine? Side effects that you should report to your doctor or health care professional as soon as possible: -allergic reactions like skin rash, itching or hives, swelling of the face, lips, or tongue -breathing problems -confusion -feeling faint or lightheaded, falls -trouble passing urine or change in the amount of urine -unusually weak or tired Side effects that usually do not require medical attention (report to your doctor or health care professional if they continue or are bothersome): -constipation -dry mouth -itching -nausea, vomiting -upset stomach This list may not describe all possible side effects. Call your doctor for medical advice about side effects. You may report side effects to FDA at 1-800-FDA-1088. Where should I keep my medicine? Keep out  of the reach of children. This medicine can be abused. Keep your medicine in a safe place to protect it from theft. Do not share this medicine with anyone. Selling or giving away this medicine is dangerous and against the law. Store at room temperature between 15 and 30 degrees C (59 and 86 degrees F). Protect from light. Keep container tightly closed. This medicine may cause accidental overdose and death if it is taken by other adults, children, or pets. Flush any unused medicine down the toilet to reduce the chance of harm. Do not use the medicine after the expiration date. NOTE: This sheet is a summary. It may not cover all possible information. If you have questions about this medicine, talk to your doctor, pharmacist, or health care provider.  2015, [employer]. (2012-10-08 13:43:33)

## 2014-04-06 ENCOUNTER — Ambulatory Visit: Payer: Medicare Other | Admitting: Gynecologic Oncology

## 2014-04-11 ENCOUNTER — Encounter: Payer: Self-pay | Admitting: *Deleted

## 2014-04-11 ENCOUNTER — Encounter: Payer: Self-pay | Admitting: Gynecologic Oncology

## 2014-04-11 ENCOUNTER — Ambulatory Visit: Payer: Medicare Other | Attending: Gynecologic Oncology | Admitting: Gynecologic Oncology

## 2014-04-11 VITALS — BP 143/54 | HR 65 | Temp 98.3°F | Resp 18 | Ht 61.0 in | Wt 176.8 lb

## 2014-04-11 DIAGNOSIS — I252 Old myocardial infarction: Secondary | ICD-10-CM | POA: Diagnosis not present

## 2014-04-11 DIAGNOSIS — Z7901 Long term (current) use of anticoagulants: Secondary | ICD-10-CM | POA: Diagnosis not present

## 2014-04-11 DIAGNOSIS — Z90710 Acquired absence of both cervix and uterus: Secondary | ICD-10-CM | POA: Diagnosis not present

## 2014-04-11 DIAGNOSIS — Z9079 Acquired absence of other genital organ(s): Secondary | ICD-10-CM | POA: Diagnosis not present

## 2014-04-11 DIAGNOSIS — C482 Malignant neoplasm of peritoneum, unspecified: Secondary | ICD-10-CM

## 2014-04-11 DIAGNOSIS — Z87891 Personal history of nicotine dependence: Secondary | ICD-10-CM | POA: Insufficient documentation

## 2014-04-11 DIAGNOSIS — I10 Essential (primary) hypertension: Secondary | ICD-10-CM | POA: Insufficient documentation

## 2014-04-11 DIAGNOSIS — Z79899 Other long term (current) drug therapy: Secondary | ICD-10-CM | POA: Insufficient documentation

## 2014-04-11 DIAGNOSIS — C784 Secondary malignant neoplasm of small intestine: Secondary | ICD-10-CM

## 2014-04-11 DIAGNOSIS — Z79890 Hormone replacement therapy (postmenopausal): Secondary | ICD-10-CM | POA: Insufficient documentation

## 2014-04-11 DIAGNOSIS — J45909 Unspecified asthma, uncomplicated: Secondary | ICD-10-CM | POA: Diagnosis not present

## 2014-04-11 DIAGNOSIS — E119 Type 2 diabetes mellitus without complications: Secondary | ICD-10-CM | POA: Diagnosis not present

## 2014-04-11 DIAGNOSIS — Z483 Aftercare following surgery for neoplasm: Secondary | ICD-10-CM

## 2014-04-11 DIAGNOSIS — Z7952 Long term (current) use of systemic steroids: Secondary | ICD-10-CM | POA: Insufficient documentation

## 2014-04-11 DIAGNOSIS — C786 Secondary malignant neoplasm of retroperitoneum and peritoneum: Secondary | ICD-10-CM | POA: Diagnosis present

## 2014-04-11 DIAGNOSIS — Z7982 Long term (current) use of aspirin: Secondary | ICD-10-CM | POA: Diagnosis not present

## 2014-04-11 DIAGNOSIS — K7460 Unspecified cirrhosis of liver: Secondary | ICD-10-CM | POA: Diagnosis not present

## 2014-04-11 DIAGNOSIS — Z90722 Acquired absence of ovaries, bilateral: Secondary | ICD-10-CM | POA: Diagnosis not present

## 2014-04-11 DIAGNOSIS — Z7951 Long term (current) use of inhaled steroids: Secondary | ICD-10-CM | POA: Insufficient documentation

## 2014-04-11 DIAGNOSIS — R161 Splenomegaly, not elsewhere classified: Secondary | ICD-10-CM | POA: Insufficient documentation

## 2014-04-11 NOTE — Progress Notes (Signed)
Late entry: Patient came to office on Wednesday, 04/06/14 for staple removal. 21 staples removed without difficulty. No redness or drainage noted at incision site. Half inch steri strips applied over incision. Post-op instructions reinforced and patient reminded of followup appt with Dr. Denman George.

## 2014-04-11 NOTE — Progress Notes (Signed)
Postoperative Followup Note: Gyn-Onc  Consult was initially requested by Dr. Ronita Hipps for the evaluation of Olivia Benson("[name]"), a 69 y.o. female with ascites, elevated CA 125, and pelvic peritoneal thickening  CC: Dr Brien Few Chief Complaint  Patient presents with  . Follow-up    primary peritoneal cancer    Assessment/Plan:  Olivia Benson") is a 69 y.o.  year old G1 with stage III primary peritoneal cancer.  She has undergone neoadjuvant chemotherapy (5 cycles) and demonstrated an excellent clinical response to this. She is s/p an interval BSO, omentectomy, debulking procedure to optimal cytoreduction on 03/29/14.  Surgery was uncomplicated. She had fairly normal appearing ovaries. There was millial studding of the small intestines with carcinomatosis. There was a dense omental cake that was completely resected. 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  I discussed with Olivia Benson that she has residual disease on the small intestines. I discussed that she requires at least an additional 3 cycles of carboplatin and paclitaxel chemotherapy. I believe that she is medically healed to begin this treatment immediately. I'm recommending a CT scan of the abdomen and pelvis and CA-125 assessment after completing 3 additional cycles of chemotherapy at which time we will measure her response, and if complete all discontinue therapy and enter surveillance. I will see her back at that time.  HPI: Olivia Benson is a 69 year old G1P1 who was seen in consultation at the request of Dr Ronita Hipps in October 2015 for the findings of a pelvic mass on examination, CT findings consistent with ovarian cancer and an elevated CA 125. Olivia Benson had approximately 1 year history of cough, for which she underwent workup with Dr [name], a pulmonologist at [facility]. With extensive evaluation and medication modification, she feels "a million percent better". However, she continued to  have stress urinary incontinence ( a symptom she has had for approximately 2 years). She had initially felt the SUI was associated with the cough, however, when she noticed that it did not improve with resolution of her cough, she sought further evaluation.  In September 2015 she was seen by her OBGYN Dr Ronita Hipps for the persistent SUI symptoms. He performed a pelvic examination which revealed a pelvic mass. He ordered a CA 125: 1195 on 11/08/13; and a CT of the abdomen and pelvis on 11/12/13 which showed:  - A large amount of ascites present.  - Findings of progressive hepatic cirrhosis with a large amount of ascites and mild splenomegaly. There are venous collaterals present.The liver is shrunken and its surface is markedly irregular with mild splenomegaly. There prominence of mesenteric venous structures.   - The urinary bladder is partially distended and grossly normal.  - Increased peritoneal soft tissue density within the pelvis fairly symmetric from right to left. This could reflect mesenteric implants in the appropriate clinical setting. There are similar findings noted along the anterior mid and lower peritoneal surface on the left.  - No definite focal adnexal masses are demonstrated.  - There is a small left pleural effusion layering posteriorly.     The patient has had a hysterectomy for benign indications in 1988 (TAH). She has a sister, a maternal aunt and a maternal cousin with breast cancer (none have been tested for BRCA).  Of note, in 2013 she had undergone a CT to work up abnormal LFT's. This showed an enlarged irregularly contoured liver and splenomegaly consistent with cirrhosis. A cirrhosis workup was unremarkable and her LFT's trended down, and  therefore no further action was taken. She denies alcoholism, and rarely drinks.  A paracentesis was performed of her ascites in October 2015, and this confirmed high grade serous adenocarcinoma consistent with ovarian/primary peritoneal  cancer.  She went on to receive 5 cycles of neoadjuvant chemotherapy with paclitaxel and carboplatin q 21 days. She has tolerated therapy very well with no major dose limiting toxicities or treatment delays. Her most recent CA 125 on day 1 of cycle 4 was 130. She had required paracenteses x 3 during her first cycles of therapy to control ascites symptoms.  CT after cycle 4 (02/23/14):  1. Interval decrease in ascites. 2. Morphologic changes in the liver consistent with cirrhosis. 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 Portal vein remains patent at this time. 3. Persistent but decreased abnormal soft tissue attenuation tracking in the omental fat. This may be secondary to interval improvement in metastatic disease. 4. Peritoneal thickening seen in the left abdomen and pelvis on the previous study has decreased and nearly resolved in the interval. This also suggests interval improvement in metastatic disease.  On April 08 2014 she underwent an exploratory laparotomy with BSO and radical tumor debulking. Intraoperative findings included a 10 cm omental cake, miliary studding of tumor implants on the small intestine measuring less than 3 mm each, small-volume ascites, small ovaries bilaterally with no apparent primary ovarian tumor, cirrhotic liver with hepatomegaly and splenomegaly. She underwent an optimal cytoreductive surgery and recovered in an uncomplicated fashion from surgery.  Interval History: The patient reports improved appetite,and diet since starting chemotherapy.  Current Meds:  Outpatient Encounter Prescriptions as of 04/11/2014  Medication Sig  . albuterol (PROVENTIL HFA;VENTOLIN HFA) 108 (90 BASE) MCG/ACT inhaler Inhale 2 puffs into the lungs every 4 (four) hours as needed for wheezing or shortness of breath.   . bisoprolol (ZEBETA) 5 MG tablet Take 1/2 tablet daily (Patient taking differently: Take 2.5 mg by mouth every morning. )  .  dexamethasone (DECADRON) 4 MG tablet Take 5 tabs with food(=54m) 12 hrs6 hrs prior to Taxol chemotherapy  . enoxaparin (LOVENOX) 40 MG/0.4ML injection Inject 0.4 mLs (40 mg total) into the skin daily.  .Marland Kitchenestradiol (ESTRACE) 1 MG tablet Take 1 mg by mouth every other day.   . ibuprofen (ADVIL,MOTRIN) 200 MG tablet Take 800 mg by mouth every 6 (six) hours as needed for moderate pain (pain).  .Marland Kitchenlidocaine-prilocaine (EMLA) cream Apply 1 application topically as needed. Apply 1-2 hours prior to [facility] access as directed.  . loratadine (CLARITIN) 10 MG tablet Take 10 mg by mouth daily as needed (Only takes when getting chemo treatment).   . metFORMIN (GLUCOPHAGE-XR) 500 MG 24 hr tablet Take 500 mg by mouth daily.  . mometasone-formoterol (DULERA) 100-5 MCG/ACT AERO Inhale 2 puffs into the lungs as needed for wheezing or shortness of breath.   . montelukast (SINGULAIR) 10 MG tablet Take 10 mg by mouth at bedtime.   .Marland KitchenoxyCODONE (OXY IR/ROXICODONE) 5 MG immediate release tablet Take 1-2 tablets (5-10 mg total) by mouth every 4 (four) hours as needed for severe pain.  . pantoprazole (PROTONIX) 40 MG tablet Take 40 mg by mouth daily.    . polyethylene glycol powder (GLYCOLAX/MIRALAX) powder Take 1 Container by mouth as needed.  . sennosides-docusate sodium (SENOKOT-S) 8.6-50 MG tablet Take 1 tablet by mouth daily as needed for constipation (constipation).   .Marland Kitchenaspirin 81 MG tablet Take 81 mg by mouth daily.    . famotidine (PEPCID)  20 MG tablet Take 20 mg by mouth at bedtime as needed for heartburn (cough).   . LORazepam (ATIVAN) 1 MG tablet Place 1 tablet under the tongue or swallow every 6 hrs as needed for nausea or as directed (Patient not taking: Reported on 04/11/2014)  . ondansetron (ZOFRAN) 8 MG tablet Take 1 tablet (8 mg total) by mouth every 8 (eight) hours as needed for nausea or vomiting. (Patient not taking: Reported on 03/17/2014)  . prochlorperazine (COMPAZINE) 10 MG tablet Take 1 tablet (10  mg total) by mouth every 6 (six) hours as needed for nausea or vomiting. (Patient not taking: Reported on 03/17/2014)  . temazepam (RESTORIL) 15 MG capsule Take 1 capsule (15 mg total) by mouth at bedtime as needed for sleep. (Patient not taking: Reported on 04/11/2014)  . traMADol (ULTRAM) 50 MG tablet Take 1 tablet (50 mg total) by mouth every 8 (eight) hours as needed for moderate pain. (Patient not taking: Reported on 04/11/2014)    Allergy:  Allergies  Allergen Reactions  . Tape Itching    Red itch with some tapes  . Benadryl [Diphenhydramine Hcl] Other (See Comments)    Restless legs   . Shellfish Allergy     HER THROAT SWELLS UP AND CLOSES  . Penicillins Rash    WHEN SHE WAS TEENAGER    Social Hx:   History   Social History  . Marital Status: Single    Spouse Name: N/A  . Number of Children: 1 D  . Years of Education: N/A   Occupational History  . Retired    Social History Main Topics  . Smoking status: Former Smoker -- 1.00 packs/day for 1 years    Types: Cigarettes    Quit date: 02/11/1982  . Smokeless tobacco: Current User  . Alcohol Use: Yes     Comment: socially  . Drug Use: No  . Sexual Activity: Not on file   Other Topics Concern  . Not on file   Social History Narrative  former smoker (quit in the 43's) Used to work teaching bar tending. Remote history of heavier drinking but "never a problem". She now drinks once every few months.  Past Surgical Hx:  Past Surgical History  Procedure Laterality Date  . Tonsillectomy  1971  . Abdominal ultrasound  Sept 2012    No gallbladder disease, + fatty liver  . Cardiac catheterization  10/25/2010    EF 60%; Normal coronaries  . Colonoscopy  10/18/2005  . Wh-mammography  11/24/2009  . Partial hysterectomy  1988  . Cosmetic surgery  2003  . Tubal ligation    . Dental surgery    . Salpingoophorectomy Bilateral 03/29/2014    Procedure: BILATERAL SALPINGO OOPHORECTOMY, OMENTECTOMY, DEBULKING;  Surgeon: Everitt Amber, MD;  Location: [facility];  Service: Gynecology;  Laterality: Bilateral;  . Laparotomy N/A 03/29/2014    Procedure: EXPLORATORY LAPAROTOMY;  Surgeon: Everitt Amber, MD;  Location: [facility];  Service: Gynecology;  Laterality: N/A;    Past Medical Hx:  Past Medical History  Diagnosis Date  . Diabetes mellitus   . Asthma   . Overactive bladder   . Elevated LFTs   . Obesity   . Hypertension   . Cirrhosis   . Portal hypertension   . Glaucoma, narrow-angle   . Ovarian cancer 11/22/2013    Disseminated ovarian cancer  . Complication of anesthesia     slow to wake up / n & v  . PONV (postoperative nausea and vomiting)   .  Family history of adverse reaction to anesthesia     multiple family members have had problems such as slow to wake / "flat lined" /  ventilator    . Non-ST elevated myocardial infarction (non-STEMI) Sept 2012    Mild elevation in troponin and CK MB but no evidence of coronary disease at time of cath  . Tingling     hands and feet  . History of skin cancer   . Frequency of urination   . Restless leg syndrome     Past Gynecological History:  Hysterectomy for heavy periods (benign) in 1988.  No LMP recorded. Patient has had a hysterectomy.  Family Hx:  Family History  Problem Relation Age of Onset  . Asthma Mother   . Diabetes Mother   . Hypertension Mother   . Allergies Mother   . Rheum arthritis Mother   . Bladder Cancer Father 60    former heavy smoker  . Hypertension Father   . Stroke Father   . Breast cancer Sister 73  . Diabetes Brother   . Allergies Brother   . Asthma Brother   . Diabetes Sister     x 3  . Allergies Sister   . Asthma Sister     x 2   . Breast cancer Maternal Aunt 85  . Breast cancer Cousin 64    (maternal) bilateral cancer  . Aneurysm Son 36    Review of Systems:  Constitutional  Feels well,  See HPI  ENT Normal appearing ears and nares bilaterally Skin/Breast  No rash, sores, jaundice, itching, dryness Cardiovascular   No chest pain, shortness of breath, or edema  Pulmonary  No cough or wheeze.  Gastro Intestinal  No nausea, vomitting, or diarrhoea. No bright red blood per rectum, no abdominal pain, change in bowel movement, or constipation. Incision healing well with no drainage or erythema.  Genito Urinary  No frequency, urgency, dysuria, + stress incontinence.  Musculo Skeletal  No deformities Neurologic  No weakness, numbness, change in gait,  Psychology  No depression, anxiety, insomnia.   Vitals:  Blood pressure 143/54, pulse 65, temperature 98.3 F (36.8 C), temperature source Oral, resp. rate 18, height 5' 1"  (1.549 m), weight 176 lb 12.8 oz (80.196 kg).  Physical Exam: WD in NAD Neck  Supple NROM, without any enlargements.  Lymph Node Survey No cervical supraclavicular or inguinal adenopathy Cardiovascular  Pulse normal rate, regularity and rhythm. S1 and S2 normal.  Lungs  Clear to auscultation bilateraly, without wheezes/crackles/rhonchi. Good air movement.  Skin  Erythema and edema on distal right arm  Psychiatry  Alert and oriented to person, place, and time  Abdomen  Normoactive bowel sounds, abdomen soft, non-tender and overweight without evidence of hernia. No dullness to percussion.  Back No CVA tenderness Genito Urinary  Vulva/vagina: Normal external female genitalia.  No lesions. No discharge or bleeding.  Bladder/urethra:  No lesions or masses, well supported bladder  Vagina: normal in appearance  Cervix: surgically absent  Uterus: surgically absent   Adnexa: no fullness or masses appreciated. Rectal  deferred Extremities  No bilateral cyanosis, clubbing or edema.   Donaciano Eva, MD   04/11/2014, 1:23 PM

## 2014-04-11 NOTE — Patient Instructions (Signed)
Plan for [place] and taxol with your next chemotherapy.  We will see you after you complete your third cycle.  Please call for any questions or concerns.

## 2014-04-15 ENCOUNTER — Other Ambulatory Visit: Payer: Self-pay

## 2014-04-15 DIAGNOSIS — C569 Malignant neoplasm of unspecified ovary: Secondary | ICD-10-CM

## 2014-04-16 ENCOUNTER — Other Ambulatory Visit: Payer: Self-pay | Admitting: Oncology

## 2014-04-18 ENCOUNTER — Ambulatory Visit (HOSPITAL_BASED_OUTPATIENT_CLINIC_OR_DEPARTMENT_OTHER): Payer: Medicare Other | Admitting: Oncology

## 2014-04-18 ENCOUNTER — Other Ambulatory Visit (HOSPITAL_BASED_OUTPATIENT_CLINIC_OR_DEPARTMENT_OTHER): Payer: Medicare Other

## 2014-04-18 ENCOUNTER — Encounter: Payer: Self-pay | Admitting: Oncology

## 2014-04-18 ENCOUNTER — Ambulatory Visit: Payer: Medicare Other | Admitting: Oncology

## 2014-04-18 VITALS — BP 127/50 | HR 63 | Temp 98.8°F | Resp 18 | Ht 61.0 in | Wt 175.8 lb

## 2014-04-18 DIAGNOSIS — Z95828 Presence of other vascular implants and grafts: Secondary | ICD-10-CM

## 2014-04-18 DIAGNOSIS — R180 Malignant ascites: Secondary | ICD-10-CM

## 2014-04-18 DIAGNOSIS — R161 Splenomegaly, not elsewhere classified: Secondary | ICD-10-CM

## 2014-04-18 DIAGNOSIS — D6959 Other secondary thrombocytopenia: Secondary | ICD-10-CM

## 2014-04-18 DIAGNOSIS — C569 Malignant neoplasm of unspecified ovary: Secondary | ICD-10-CM

## 2014-04-18 DIAGNOSIS — K7460 Unspecified cirrhosis of liver: Secondary | ICD-10-CM

## 2014-04-18 DIAGNOSIS — E669 Obesity, unspecified: Secondary | ICD-10-CM

## 2014-04-18 DIAGNOSIS — E1169 Type 2 diabetes mellitus with other specified complication: Secondary | ICD-10-CM

## 2014-04-18 DIAGNOSIS — E119 Type 2 diabetes mellitus without complications: Secondary | ICD-10-CM | POA: Diagnosis not present

## 2014-04-18 DIAGNOSIS — K7581 Nonalcoholic steatohepatitis (NASH): Secondary | ICD-10-CM

## 2014-04-18 DIAGNOSIS — G2581 Restless legs syndrome: Secondary | ICD-10-CM

## 2014-04-18 LAB — CBC WITH DIFFERENTIAL/PLATELET
BASO%: 0.2 % (ref 0.0–2.0)
Basophils Absolute: 0 10*3/uL (ref 0.0–0.1)
EOS%: 11.5 % — ABNORMAL HIGH (ref 0.0–7.0)
Eosinophils Absolute: 0.5 10*3/uL (ref 0.0–0.5)
HCT: 32.2 % — ABNORMAL LOW (ref 34.8–46.6)
HGB: 10 g/dL — ABNORMAL LOW (ref 11.6–15.9)
LYMPH%: 22.6 % (ref 14.0–49.7)
MCH: 28.7 pg (ref 25.1–34.0)
MCHC: 31.1 g/dL — ABNORMAL LOW (ref 31.5–36.0)
MCV: 92.5 fL (ref 79.5–101.0)
MONO#: 0.2 10*3/uL (ref 0.1–0.9)
MONO%: 5.2 % (ref 0.0–14.0)
NEUT#: 2.8 10*3/uL (ref 1.5–6.5)
NEUT%: 60.5 % (ref 38.4–76.8)
Platelets: 154 10*3/uL (ref 145–400)
RBC: 3.48 10*6/uL — ABNORMAL LOW (ref 3.70–5.45)
RDW: 15 % — ABNORMAL HIGH (ref 11.2–14.5)
WBC: 4.6 10*3/uL (ref 3.9–10.3)
lymph#: 1 10*3/uL (ref 0.9–3.3)

## 2014-04-18 LAB — COMPREHENSIVE METABOLIC PANEL (CC13)
ALT: 24 U/L (ref 0–55)
AST: 28 U/L (ref 5–34)
Albumin: 3.6 g/dL (ref 3.5–5.0)
Alkaline Phosphatase: 113 U/L (ref 40–150)
Anion Gap: 9 mEq/L (ref 3–11)
BUN: 14.5 mg/dL (ref 7.0–26.0)
CO2: 24 mEq/L (ref 22–29)
Calcium: 9.2 mg/dL (ref 8.4–10.4)
Chloride: 109 mEq/L (ref 98–109)
Creatinine: 0.8 mg/dL (ref 0.6–1.1)
EGFR: 72 mL/min/{1.73_m2} — ABNORMAL LOW (ref >90)
Glucose: 141 mg/dl — ABNORMAL HIGH (ref 70–140)
Potassium: 3.8 mEq/L (ref 3.5–5.1)
Sodium: 142 mEq/L (ref 136–145)
Total Bilirubin: 0.42 mg/dL (ref 0.20–1.20)
Total Protein: 7.1 g/dL (ref 6.4–8.3)

## 2014-04-18 LAB — CA 125: CA 125: 122 U/mL — ABNORMAL HIGH (ref <35)

## 2014-04-18 MED ORDER — DEXAMETHASONE 4 MG PO TABS: ORAL_TABLET | ORAL | Status: DC

## 2014-04-18 MED ORDER — OXYCODONE HCL 5 MG PO TABS: ORAL_TABLET | ORAL | Status: DC

## 2014-04-18 NOTE — Progress Notes (Signed)
OFFICE PROGRESS NOTE   April 18, 2014   Physicians:Olivia Barrington Ellison, MD (PCP), Olivia Benson, Olivia Benson, Olivia Benson/ Olivia Benson  INTERVAL HISTORY:  Patient is seen, together with significant other, as we plan additional chemotherapy now post interval debulking of primary peritoneal carcinoma by Olivia Benson on 03-29-14. This procedure was BSO, omentectomy and debulking to optimal, but still residual including on small intestine, with the omental cake 10 cm and densely adherent to transverse colon with <3 mm TNTC miliary studding/ carcinomatosis. Patient was hospitalized 2-16 thru 04-01-14 and saw Olivia Benson in clinic on 04-11-14. Surgical Benson [phone]) showed apparent primary peritoneal high grade serous malignancy. Recommendation is for 3 additional cycles carboplatin and taxane, then repeat CT AP.  Patient has been gradually recovering from the recent surgery, still on prophylactic lovenox with 9 doses left. She has L>R abdominal discomfort, which is a deep, burning sensation at times, improved with prn oxyIR 5 mg. Bowels are moving daily with daily miralax. She denies bleeding, fever, clear symptoms of UTI. 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 Chronic back and shoulder symptoms have been aggravated by other limitations since surgery. She is eating, no nausea or vomiting. She has not checked blood sugars since DC home, ate donut before blood sugar 140 by our lab today.She is still on limited activity and has not resumed driving.    PAC in Flu vaccine done Genetics testing from 02-2014 normal ([employer] panel)  ONCOLOGIC HISTORY Patient initially had problems in ~ July 2015 with stress urinary incontinence related to severe chronic cough. Urine culture by PCP showed no infection and she was referred to Olivia Benson, who adjusted medications with great improvement in the cough. Despite improved cough, she continued with some urinary incontinence. She saw Olivia Benson in  10-2013, with pelvic mass on exam and CA 125 1195 on 11-08-13. She had CT AP 11-12-13, which showed large ascites, irregular and shrunken liver, mild splenomegaly, increased soft tissue in pelvis bilaterally and anterior mid and lower peritoneum, small left pleural effusion. She saw Olivia Benson in consultation on 11-15-13, who agreed that this was likely gyn malignancy. She had US paracentesis of 4.1 liters on 11-18-13, with cytology ([id number]) and immunohistochemical stains consistent with serous carcinoma of Mullerian tract primary, favor ovarian. She had second paracentesis for 3.6 liters on 12-03-13. Gyn oncology recommended initial treatment with chemotherapy. She had first carboplatin and taxol on 12-07-13, with carboplatin dosed at AUC=4 and taxol at 135 mg/m2 due to concern for cirrhosis. She has needed gCSF support and even so was admitted with febrile neutropenia after cycle 3. CA125 was 1938 on 12-01-13 as baseline for chemotherapy, down to 130 prior to cycle 4 and 45 after cycle 5. She had optimal debulking of what was still large amount of disease on 03-29-14, with extensive miliary involvement residual including on small intestine, Benson high grade serous primary peritoneal carcinoma.   Review of systems as above, also: No SOB or cough. No LE swelling. PAC was used in hospital Remainder of 10 point Review of Systems negative.  Objective:  Vital signs in last 24 hours: 175 lbs, BMI 33.3, 127/50, 63 regular, 18 not labored RA, 98.8  Alert, oriented and appropriate. Ambulatory without assistance, able to get on and off exam table.  Alopecia  HEENT:PERRL, sclerae not icteric. Oral mucosa moist without lesions, posterior pharynx clear.  Neck supple. No JVD.  Lymphatics:no cervical,supraclavicular adenopathy Resp: clear to auscultation bilaterally and normal percussion bilaterally Cardio: regular rate  and rhythm. No gallop. GI: abdomen soft, minimally tender left lateral, not distended, no  mass or organomegaly. Some bowel sounds. Surgical incision entirely closed, no erythema, no unusual tenderness Musculoskeletal/ Extremities: without pitting edema, cords, tenderness Neuro: no change LE peripheral neuropathy. Otherwise nonfocal. PSYCH appropriate mood and affect Skin without rash, ecchymosis, petechiae Portacath-without erythema or tenderness  Lab Results:  Results for orders placed or performed in visit on 04/18/14  CBC with Differential  Result Value Ref Range   WBC 4.6 3.9 - 10.3 10e3/uL   NEUT# 2.8 1.5 - 6.5 10e3/uL   HGB 10.0 (L) 11.6 - 15.9 g/dL   HCT 32.2 (L) 34.8 - 46.6 %   Platelets 154 145 - 400 10e3/uL   MCV 92.5 79.5 - 101.0 fL   MCH 28.7 25.1 - 34.0 pg   MCHC 31.1 (L) 31.5 - 36.0 g/dL   RBC 3.48 (L) 3.70 - 5.45 10e6/uL   RDW 15.0 (H) 11.2 - 14.5 %   lymph# 1.0 0.9 - 3.3 10e3/uL   MONO# 0.2 0.1 - 0.9 10e3/uL   Eosinophils Absolute 0.5 0.0 - 0.5 10e3/uL   Basophils Absolute 0.0 0.0 - 0.1 10e3/uL   NEUT% 60.5 38.4 - 76.8 %   LYMPH% 22.6 14.0 - 49.7 %   MONO% 5.2 0.0 - 14.0 %   EOS% 11.5 (H) 0.0 - 7.0 %   BASO% 0.2 0.0 - 2.0 %    CMET available during visit glucose 141, electrolytes fine, creat 0.8, LFTs WNL, protein and alb good.  CA125 available after visit 122, this probably reflecting post operative changes, prior was 45 on 03-17-14.  Studies/Results:  Surgical Benson Olivia Benson, Olivia Benson Collected: 03/29/2014 Client: [facility] Accession: [id number] Received: 03/29/2014 Olivia Benson, Olivia Benson FINAL DIAGNOSIS Diagnosis 1. Omentum, resection for tumor - HIGH GRADE SEROUS CARCINOMA, SEE COMMENT. 2. Ovary and fallopian tube, right - HIGH GRADE SEROUS CARCINOMA, SEE COMMENT. 3. Ovary and fallopian tube, left - HIGH GRADE SEROUS CARCINOMA, SEE COMMENT. Diagnosis Note 1. Nests and clusters of malignant cells are invading the omental tissue (part #1) with associated fibrosis. The cells are pleomorphic with prominent nucleoli. There  are scattered psammoma bodies. The ovaries are atrophic and exhibit multiple foci of surface based invasive carcinoma and associated fibrosis. The fallopian tubes have a few foci of carcinoma, also superficially located. There are no precursor lesions noted in the ovaries or fallopian tubes (the entire tubes and ovaries were submitted for evaluation). While there is some retraction artifact, there are several foci suspicious for lymphovascular invasion. 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 Given the fibrosis, there does appear to be a small amount of treatment effect, however, there is abundant residual tumor.     Medications: I have reviewed the patient'Benson current medications. Per financial staff, fine for granix/ neupogen weekly beginning 04-20-14. Prescription given for oxycodone, as this was helpful and tolerated after surgery. Decadron will be dosed at 20 mg 12 hrs prior to taxol. She will continue lovenox for now, but will hold dose and call if any bleeding or unusual bruising as we resume chemo.  DISCUSSION  Operative and surgical path findings discussed, as well as recommendation for additional chemotherapy. I have recommended that we change regimen to weekly dose dense carbo taxol, which hopefully she will tolerate more easily than with previous taxol aches, may be beneficial with more continuous exposure  to the chemo, and will be easily administered as she now has PAC. Friend tells me after visit that patient had  thought she could not tolerate more of same chemo, so this suggestion was welcomed. She will begin with single agent carboplatin on 04-19-14, again with AUC =4 due to significant cytopenias with previous treatment, and will add granix on 04-20-14 (cleared with financial staff after visit, will let patient know). Will add weekly taxol beginning next  week.  Assessment/Plan:  1.high grade serous primary peritoneal carcinoma: Neoadjuvant chemotherapy with carboplatin and taxol begun 12-07-13 and cycle 5 03-08-14 with neulasta. Interval optimal debulking of what was still large amount of disease, with residual TNTC <42m tumor areas. Resume chemotherapy, changing regimen to weekly dose dense carbo taxol. She will have carbo only on 3-8 due to scheduling, then add weekly taxol starting next week. 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 Follow liver function closely with chemo.Known to Olivia MLucio Edward 4.diabetes on oral agent, does not follow blood sugars regularly at home  5.stress urinary incontinence no worse 6.chronic cough: better with medications by Olivia WMelvyn Novas Past tobacco 7. 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 Note continuing prophylactic lovenox, see above 8,post TAH 1988 for benign indications 9.nonSTEMI 2012 unremarkable cath then, HTN, known to Olivia NAcie Fredrickson10.chronic Estrace x years for hot flashes. Tapering down and will try to DC 11.narrow angle glaucoma: following with ophthalmology 12.restless legs: intermittently severe, interferes with sleep. 13.admission for febrile neutropenia after cycle 3, despite neulasta and despite chemo doses slightly reduced due to cirrhosis. WIll use granix day after each chemo with dose dense regimen. 14.PAC in  All questions answered. Patient and friend understand discussion and are in agreement with recommendations and plans. Chemo and granix orders placed. Verbal consent obtained. Time spent 40 min including >50% counseling and coordination of care.    LIVESAY,LENNIS P, MD   04/18/2014, 8:35 AM

## 2014-04-18 NOTE — Patient Instructions (Signed)
Decadron prescription will be five tablets (=20 mg)with food 12 hrs before taxol (no dose 6 hrs prior)   Continue miralax daily  Hold lovenox and call if any bleeding or unusual bruising

## 2014-04-19 ENCOUNTER — Telehealth: Payer: Self-pay | Admitting: *Deleted

## 2014-04-19 ENCOUNTER — Other Ambulatory Visit: Payer: Self-pay | Admitting: *Deleted

## 2014-04-19 ENCOUNTER — Other Ambulatory Visit: Payer: Self-pay | Admitting: Oncology

## 2014-04-19 ENCOUNTER — Ambulatory Visit (HOSPITAL_BASED_OUTPATIENT_CLINIC_OR_DEPARTMENT_OTHER): Payer: Medicare Other

## 2014-04-19 DIAGNOSIS — Z5111 Encounter for antineoplastic chemotherapy: Secondary | ICD-10-CM

## 2014-04-19 DIAGNOSIS — C569 Malignant neoplasm of unspecified ovary: Secondary | ICD-10-CM | POA: Diagnosis not present

## 2014-04-19 MED ORDER — SODIUM CHLORIDE 0.9 % IV SOLN
Freq: Once | INTRAVENOUS | Status: AC
  Administered 2014-04-19: 09:00:00 via INTRAVENOUS
  Filled 2014-04-19: qty 8

## 2014-04-19 MED ORDER — SODIUM CHLORIDE 0.9 % IJ SOLN
10.0000 mL | INTRAMUSCULAR | Status: DC | PRN
  Administered 2014-04-19: 10 mL
  Filled 2014-04-19: qty 10

## 2014-04-19 MED ORDER — FAMOTIDINE IN NACL 20-0.9 MG/50ML-% IV SOLN
20.0000 mg | Freq: Once | INTRAVENOUS | Status: AC
  Administered 2014-04-19: 20 mg via INTRAVENOUS

## 2014-04-19 MED ORDER — DIPHENHYDRAMINE HCL 50 MG/ML IJ SOLN
INTRAMUSCULAR | Status: AC
Start: 2014-04-19 — End: 2014-04-19
  Filled 2014-04-19: qty 1

## 2014-04-19 MED ORDER — CARBOPLATIN CHEMO INJECTION 450 MG/45ML
378.4000 mg | Freq: Once | INTRAVENOUS | Status: AC
  Administered 2014-04-19: 380 mg via INTRAVENOUS
  Filled 2014-04-19: qty 38

## 2014-04-19 MED ORDER — SODIUM CHLORIDE 0.9 % IV SOLN
Freq: Once | INTRAVENOUS | Status: AC
  Administered 2014-04-19: 08:00:00 via INTRAVENOUS

## 2014-04-19 MED ORDER — FAMOTIDINE IN NACL 20-0.9 MG/50ML-% IV SOLN
INTRAVENOUS | Status: AC
  Filled 2014-04-19: qty 50

## 2014-04-19 MED ORDER — DIPHENHYDRAMINE HCL 50 MG/ML IJ SOLN
25.0000 mg | Freq: Once | INTRAMUSCULAR | Status: AC
  Administered 2014-04-19: 25 mg via INTRAVENOUS

## 2014-04-19 MED ORDER — HEPARIN SOD (PORK) LOCK FLUSH 100 UNIT/ML IV SOLN
500.0000 [IU] | Freq: Once | INTRAVENOUS | Status: AC | PRN
  Administered 2014-04-19: 500 [IU]
  Filled 2014-04-19: qty 5

## 2014-04-19 NOTE — Telephone Encounter (Signed)
Per staff message and POF I have scheduled appts. Advised scheduler of appts. JMW  

## 2014-04-19 NOTE — Telephone Encounter (Signed)
Per Sharyn Lull - first available infusion appt for Monday, 04/25/14, is at 11:45am.  Patient's lab and MD appt moved to later in the morning on Monday so patient will not have to wait as long after MD visit for chemo. Called patient and she wrote down new appt times for Monday. Gave patient injection appt for tomorrow 04/20/14 at 10:15. Let patient know that she will be receiving Granix tomorrow and that she should have less aches with Granix than previous Neulasta. Told patient it is okay to take Claritin if she wants after this injection. Pt verbalized understanding.   Also, let patient know results of CA125 per Dr. Marko Plume while on the phone. Patient appreciative of call.

## 2014-04-19 NOTE — Patient Instructions (Signed)
[facility] Discharge Instructions for Patients Receiving Chemotherapy  Today you received the following chemotherapy agents: Carboplatin  To help prevent nausea and vomiting after your treatment, we encourage you to take your nausea medication as prescribed by your physician.   If you develop nausea and vomiting that is not controlled by your nausea medication, call the clinic.   BELOW ARE SYMPTOMS THAT SHOULD BE REPORTED IMMEDIATELY:  *FEVER GREATER THAN 100.5 F  *CHILLS WITH OR WITHOUT FEVER  NAUSEA AND VOMITING THAT IS NOT CONTROLLED WITH YOUR NAUSEA MEDICATION  *UNUSUAL SHORTNESS OF BREATH  *UNUSUAL BRUISING OR BLEEDING  TENDERNESS IN MOUTH AND THROAT WITH OR WITHOUT PRESENCE OF ULCERS  *URINARY PROBLEMS  *BOWEL PROBLEMS  UNUSUAL RASH Items with * indicate a potential emergency and should be followed up as soon as possible.  Feel free to call the clinic you have any questions or concerns. The clinic phone number is [phone].

## 2014-04-20 ENCOUNTER — Ambulatory Visit (HOSPITAL_BASED_OUTPATIENT_CLINIC_OR_DEPARTMENT_OTHER): Payer: Medicare Other

## 2014-04-20 DIAGNOSIS — C561 Malignant neoplasm of right ovary: Secondary | ICD-10-CM

## 2014-04-20 DIAGNOSIS — C562 Malignant neoplasm of left ovary: Secondary | ICD-10-CM

## 2014-04-20 DIAGNOSIS — C709 Malignant neoplasm of meninges, unspecified: Secondary | ICD-10-CM

## 2014-04-20 DIAGNOSIS — C5701 Malignant neoplasm of right fallopian tube: Secondary | ICD-10-CM

## 2014-04-20 DIAGNOSIS — C5702 Malignant neoplasm of left fallopian tube: Secondary | ICD-10-CM

## 2014-04-20 DIAGNOSIS — C569 Malignant neoplasm of unspecified ovary: Secondary | ICD-10-CM

## 2014-04-20 MED ORDER — TBO-FILGRASTIM 300 MCG/0.5ML ~~LOC~~ SOSY
300.0000 ug | PREFILLED_SYRINGE | Freq: Once | SUBCUTANEOUS | Status: AC
  Administered 2014-04-20: 300 ug via SUBCUTANEOUS
  Filled 2014-04-20: qty 0.5

## 2014-04-22 ENCOUNTER — Telehealth: Payer: Self-pay | Admitting: *Deleted

## 2014-04-22 NOTE — Telephone Encounter (Signed)
[name] called to ask if she will be getting Taxol on Monday , 04/25/14, so that she can take her pre-meds appropriately.   Per her treatment plan, she is scheduled for Taxol.  Related same to patient.

## 2014-04-24 ENCOUNTER — Other Ambulatory Visit: Payer: Self-pay | Admitting: Oncology

## 2014-04-25 ENCOUNTER — Ambulatory Visit: Payer: Medicare Other

## 2014-04-25 ENCOUNTER — Ambulatory Visit (HOSPITAL_BASED_OUTPATIENT_CLINIC_OR_DEPARTMENT_OTHER): Payer: Medicare Other | Admitting: Oncology

## 2014-04-25 ENCOUNTER — Other Ambulatory Visit (HOSPITAL_BASED_OUTPATIENT_CLINIC_OR_DEPARTMENT_OTHER): Payer: Medicare Other

## 2014-04-25 ENCOUNTER — Other Ambulatory Visit: Payer: Medicare Other

## 2014-04-25 ENCOUNTER — Ambulatory Visit: Payer: Medicare Other | Admitting: Oncology

## 2014-04-25 ENCOUNTER — Encounter: Payer: Self-pay | Admitting: Oncology

## 2014-04-25 ENCOUNTER — Telehealth: Payer: Self-pay | Admitting: Oncology

## 2014-04-25 VITALS — BP 121/89 | HR 72 | Temp 98.6°F | Resp 18 | Ht 61.0 in | Wt 170.9 lb

## 2014-04-25 DIAGNOSIS — C482 Malignant neoplasm of peritoneum, unspecified: Secondary | ICD-10-CM

## 2014-04-25 DIAGNOSIS — R161 Splenomegaly, not elsewhere classified: Secondary | ICD-10-CM

## 2014-04-25 DIAGNOSIS — C569 Malignant neoplasm of unspecified ovary: Secondary | ICD-10-CM

## 2014-04-25 DIAGNOSIS — D701 Agranulocytosis secondary to cancer chemotherapy: Secondary | ICD-10-CM

## 2014-04-25 DIAGNOSIS — D708 Other neutropenia: Secondary | ICD-10-CM

## 2014-04-25 DIAGNOSIS — T451X5A Adverse effect of antineoplastic and immunosuppressive drugs, initial encounter: Secondary | ICD-10-CM

## 2014-04-25 DIAGNOSIS — E1169 Type 2 diabetes mellitus with other specified complication: Secondary | ICD-10-CM

## 2014-04-25 DIAGNOSIS — K7460 Unspecified cirrhosis of liver: Secondary | ICD-10-CM

## 2014-04-25 DIAGNOSIS — Z95828 Presence of other vascular implants and grafts: Secondary | ICD-10-CM

## 2014-04-25 DIAGNOSIS — Z5111 Encounter for antineoplastic chemotherapy: Secondary | ICD-10-CM | POA: Diagnosis not present

## 2014-04-25 DIAGNOSIS — D6959 Other secondary thrombocytopenia: Secondary | ICD-10-CM | POA: Diagnosis not present

## 2014-04-25 DIAGNOSIS — E669 Obesity, unspecified: Secondary | ICD-10-CM

## 2014-04-25 DIAGNOSIS — G2581 Restless legs syndrome: Secondary | ICD-10-CM

## 2014-04-25 DIAGNOSIS — K7581 Nonalcoholic steatohepatitis (NASH): Secondary | ICD-10-CM

## 2014-04-25 LAB — COMPREHENSIVE METABOLIC PANEL (CC13)
ALT: 42 U/L (ref 0–55)
AST: 26 U/L (ref 5–34)
Albumin: 4.1 g/dL (ref 3.5–5.0)
Alkaline Phosphatase: 117 U/L (ref 40–150)
Anion Gap: 14 mEq/L — ABNORMAL HIGH (ref 3–11)
BUN: 17.9 mg/dL (ref 7.0–26.0)
CO2: 18 mEq/L — ABNORMAL LOW (ref 22–29)
Calcium: 9.7 mg/dL (ref 8.4–10.4)
Chloride: 105 mEq/L (ref 98–109)
Creatinine: 1.1 mg/dL (ref 0.6–1.1)
EGFR: 54 mL/min/{1.73_m2} — ABNORMAL LOW (ref >90)
Glucose: 344 mg/dl — ABNORMAL HIGH (ref 70–140)
Potassium: 4.5 mEq/L (ref 3.5–5.1)
Sodium: 136 mEq/L (ref 136–145)
Total Bilirubin: 0.67 mg/dL (ref 0.20–1.20)
Total Protein: 8 g/dL (ref 6.4–8.3)

## 2014-04-25 LAB — CBC WITH DIFFERENTIAL/PLATELET
BASO%: 0.3 % (ref 0.0–2.0)
Basophils Absolute: 0 10*3/uL (ref 0.0–0.1)
EOS%: 0 % (ref 0.0–7.0)
Eosinophils Absolute: 0 10*3/uL (ref 0.0–0.5)
HCT: 34.6 % — ABNORMAL LOW (ref 34.8–46.6)
HGB: 11.2 g/dL — ABNORMAL LOW (ref 11.6–15.9)
LYMPH%: 17.8 % (ref 14.0–49.7)
MCH: 28.7 pg (ref 25.1–34.0)
MCHC: 32.4 g/dL (ref 31.5–36.0)
MCV: 88.7 fL (ref 79.5–101.0)
MONO#: 0.1 10*3/uL (ref 0.1–0.9)
MONO%: 1.7 % (ref 0.0–14.0)
NEUT#: 2.8 10*3/uL (ref 1.5–6.5)
NEUT%: 80.2 % — ABNORMAL HIGH (ref 38.4–76.8)
Platelets: 154 10*3/uL (ref 145–400)
RBC: 3.9 10*6/uL (ref 3.70–5.45)
RDW: 14.9 % — ABNORMAL HIGH (ref 11.2–14.5)
WBC: 3.5 10*3/uL — ABNORMAL LOW (ref 3.9–10.3)
lymph#: 0.6 10*3/uL — ABNORMAL LOW (ref 0.9–3.3)
nRBC: 0 % (ref 0–0)

## 2014-04-25 MED ORDER — DIPHENHYDRAMINE HCL 50 MG/ML IJ SOLN
12.5000 mg | Freq: Once | INTRAMUSCULAR | Status: AC
  Administered 2014-04-25: 12.5 mg via INTRAVENOUS

## 2014-04-25 MED ORDER — SODIUM CHLORIDE 0.9 % IJ SOLN
10.0000 mL | INTRAMUSCULAR | Status: DC | PRN
  Administered 2014-04-25: 10 mL via INTRAVENOUS
  Filled 2014-04-25: qty 10

## 2014-04-25 MED ORDER — HEPARIN SOD (PORK) LOCK FLUSH 100 UNIT/ML IV SOLN
500.0000 [IU] | Freq: Once | INTRAVENOUS | Status: AC
  Administered 2014-04-25: 500 [IU] via INTRAVENOUS
  Filled 2014-04-25: qty 5

## 2014-04-25 MED ORDER — TEMAZEPAM 15 MG PO CAPS: 15.0000 mg | ORAL_CAPSULE | Freq: Every evening | ORAL | Status: DC | PRN

## 2014-04-25 MED ORDER — FAMOTIDINE IN NACL 20-0.9 MG/50ML-% IV SOLN
INTRAVENOUS | Status: AC
  Filled 2014-04-25: qty 50

## 2014-04-25 MED ORDER — SODIUM CHLORIDE 0.9 % IV SOLN
Freq: Once | INTRAVENOUS | Status: AC
  Administered 2014-04-25: 12:00:00 via INTRAVENOUS
  Filled 2014-04-25: qty 4

## 2014-04-25 MED ORDER — DIPHENHYDRAMINE HCL 50 MG/ML IJ SOLN
INTRAMUSCULAR | Status: AC
  Filled 2014-04-25: qty 1

## 2014-04-25 MED ORDER — DEXTROSE 5 % IV SOLN
80.0000 mg/m2 | Freq: Once | INTRAVENOUS | Status: AC
  Administered 2014-04-25: 150 mg via INTRAVENOUS
  Filled 2014-04-25: qty 25

## 2014-04-25 MED ORDER — SODIUM CHLORIDE 0.9 % IV SOLN
Freq: Once | INTRAVENOUS | Status: AC
  Administered 2014-04-25: 12:00:00 via INTRAVENOUS

## 2014-04-25 MED ORDER — FAMOTIDINE IN NACL 20-0.9 MG/50ML-% IV SOLN
20.0000 mg | Freq: Once | INTRAVENOUS | Status: AC
  Administered 2014-04-25: 20 mg via INTRAVENOUS

## 2014-04-25 MED ORDER — PANTOPRAZOLE SODIUM 40 MG PO TBEC: 40.0000 mg | DELAYED_RELEASE_TABLET | Freq: Two times a day (BID) | ORAL | Status: DC

## 2014-04-25 MED ORDER — LORAZEPAM 1 MG PO TABS: ORAL_TABLET | ORAL | Status: DC

## 2014-04-25 NOTE — Progress Notes (Signed)
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 She had 5 cycles of every 3 week carboplatin taxol from 12-07-13 thru 03-08-14, with improvement in CA 125 from 1938 to 45. She still had extensive tumor at surgery, which was optimal debulking but still diffuse miliary tumor at completion of procedure. Chemo regimen was changed to dose dense when resumed on 04-19-14, in hopes of less difficulty side effects. Day 1 cycle 1 was carboplatin only due to post op PS concerns then. She had granix on day 2 and will need this at least day after each treatment (days 2,9,16). She is for day 8 taxol today. Plan repeat scans after 3 cycles. Course to date has been complicated by NASH with cirrhosis and splenomegaly, cytopenias, severe restless legs and taxol aches.  Patient tolerated the single agent carboplatin on 04-19-14 without acute problems, tho restless legs were severe during chemo and taste is affected again. She took premed decadron 20 mg 12 hrs prior to today's treatment, did not sleep much last pm after this. She can tell further improvement from surgery, with less abdominal distension and little discomfort there. She is more active at home, still avoiding any heavy lifting or strenuous activity. Hot flashed very bothersome since she has been using estrace QOD (see below). Emotional at times. She hopes to go to family event at church this weekend.   PAC in Flu vaccine done Genetics testing from 02-2014 normal ([employer] panel)  ONCOLOGIC HISTORY Patient initially had problems in ~ July 2015 with stress urinary incontinence related to severe chronic cough. Urine culture by PCP showed no infection and she was  referred to Dr Clois Comber, who adjusted medications with great improvement in the cough. Despite improved cough, she continued with some urinary incontinence. She saw Dr Ronita Hipps in 10-2013, with pelvic mass on exam and CA 125 1195 on 11-08-13. She had CT AP 11-12-13, which showed large ascites, irregular and shrunken liver, mild splenomegaly, increased soft tissue in pelvis bilaterally and anterior mid and lower peritoneum, small left pleural effusion. She saw Dr Denman George in consultation on 11-15-13, who agreed that this was likely gyn malignancy. 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 She had second paracentesis for 3.6 liters on 12-03-13. Gyn oncology recommended initial treatment with chemotherapy. She had first carboplatin and taxol on 12-07-13, with carboplatin dosed at AUC=4 and taxol at 135 mg/m2 due to concern for cirrhosis. She has needed gCSF support and even so was admitted with febrile neutropenia after cycle 3. CA125 was 1938 on 12-01-13 as baseline for chemotherapy, down to 130 prior to cycle 4 and 45 after cycle 5. 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 Chemo resumed [id number], regimen changed to dose dense carbo taxol.    Review of systems as above, also: No fever or symptoms of infection. No cough, no increased SOB, no chest pain. No bleeding. Bowels moving better. No LE swelling. PAC functioning well. She is eating and is drinking fluids. Remainder of 10 point Review of Systems negative.  Objective:  Vital signs in last 24 hours:  BP 121/89 mmHg  Pulse 72  Temp(Src) 98.6 F (37 C) (Oral)  Resp 18  Ht _0  (1.549 m)  Wt 170 lb 14.4 oz (77.52 kg)  BMI 32.31 kg/m2  SpO2  98% Weight down 5 lbs Alert, oriented and appropriate. Obviously more easily ambulatory and mobile today..  Total alopecia  HEENT:PERRL, sclerae not icteric. Oral mucosa moist without lesions, posterior pharynx clear.  Neck supple. No JVD.  Lymphatics:no cervical,supraclavicular or inguinal adenopathy Resp: clear to auscultation bilaterally and normal percussion bilaterally Cardio: regular rate and rhythm. No gallop. GI: soft, nontender, less distended, no appreciable mass or organomegaly. Normally active bowel sounds. Surgical incision entirely closed, not tender Musculoskeletal/ Extremities: without pitting edema, cords, tenderness Neuro: no change peripheral neuropathy. Otherwise nonfocal. Neuro appropriate mood and affect Skin without rash, ecchymosis, petechiae Portacath-without erythema or tenderness  Lab Results:  Results for orders placed or performed in visit on 04/25/14  CBC with Differential  Result Value Ref Range   WBC 3.5 (L) 3.9 - 10.3 10e3/uL   NEUT# 2.8 1.5 - 6.5 10e3/uL   HGB 11.2 (L) 11.6 - 15.9 g/dL   HCT 34.6 (L) 34.8 - 46.6 %   Platelets 154 145 - 400 10e3/uL   MCV 88.7 79.5 - 101.0 fL   MCH 28.7 25.1 - 34.0 pg   MCHC 32.4 31.5 - 36.0 g/dL   RBC 3.90 3.70 - 5.45 10e6/uL   RDW 14.9 (H) 11.2 - 14.5 %   lymph# 0.6 (L) 0.9 - 3.3 10e3/uL   MONO# 0.1 0.1 - 0.9 10e3/uL   Eosinophils Absolute 0.0 0.0 - 0.5 10e3/uL   Basophils Absolute 0.0 0.0 - 0.1 10e3/uL   NEUT% 80.2 (H) 38.4 - 76.8 %   LYMPH% 17.8 14.0 - 49.7 %   MONO% 1.7 0.0 - 14.0 %   EOS% 0.0 0.0 - 7.0 %   BASO% 0.3 0.0 - 2.0 %   nRBC 0 0 - 0 %  Comprehensive metabolic panel (Cmet) - [facility]  Result Value Ref Range   Sodium 136 136 - 145 mEq/L   Potassium 4.5 3.5 - 5.1 mEq/L   Chloride 105 98 - 109 mEq/L   CO2 18 (L) 22 - 29 mEq/L   Glucose 344 (H) 70 - 140 mg/dl   BUN 17.9 7.0 - 26.0 mg/dL   Creatinine 1.1 0.6 - 1.1 mg/dL   Total Bilirubin 0.67 0.20 - 1.20 mg/dL   Alkaline Phosphatase 117 40 - 150  U/L   AST 26 5 - 34 U/L   ALT 42 0 - 55 U/L   Total Protein 8.0 6.4 - 8.3 g/dL   Albumin 4.1 3.5 - 5.0 g/dL   Calcium 9.7 8.4 - 10.4 mg/dL   Anion Gap 14 (H) 3 - 11 mEq/L   EGFR 54 (L) >90 ml/min/1.73 m2    CA 125 04-18-14   122, likely in part recent surgery.  Studies/Results:  No results found.  Medications: I have reviewed the patient's current medications. Continuing to hold baby ASA. Will increase estrace back to 1/2 tab daily instead of qod, due to severe hot flashes. Premed benadryl decreased to 12.5 mg beginning today, due to severe restless legs and no previous taxol allergic problems.  DISCUSSION: use of granix at least day after each chemo on this regimen. Other med changes as noted.  Assessment/Plan:  1.high grade serous primary peritoneal carcinoma: Neoadjuvant chemotherapy with carboplatin and taxol begun 12-07-13 and cycle 5 03-08-14 with  neulasta. Interval optimal debulking of what was still large amount of disease, with residual TNTC <54m tumor areas. Resume chemotherapy, regimen changed to weekly dose dense carbo taxol beginning 04-19-14 (carbo only day 1 cycle 1) 2.no difficulty with anesthesia this surgery 3.hepatic cirrhosis, with mild splenomegaly/ suggestion of portal HTN: likely NASH. Follow liver function closely with chemo.Known to Dr MLucio Edward 4.diabetes on oral agent, does not follow blood sugars regularly at home. Cover with SS with chemo treatments 5.stress urinary incontinence no worse 6.chronic cough: better with medications by Dr WMelvyn Novas Past tobacco 7. 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 Prophylactic lovenox nearly completed. 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 Tapering down but intolerable hot flashes now, increase back to 1/2 tab daily from present 1/2 tab qod. 11.narrow angle  glaucoma: following with ophthalmology 12.restless legs: intermittently severe, interferes with sleep.Benadryl decreased. 13.admission for febrile neutropenia after cycle 3, despite neulasta and despite chemo doses slightly reduced due to cirrhosis. WIll use granix day after each chemo with dose dense regimen. 14.PAC in  I will see her back with treatments next 2 weeks. She knows to call if needed prior to scheduled appointments.  Chemo and granix orders confirmed. Time spent 25 min including >50% counseling and coordination of care.  Cardarius Senat P, MD   04/25/2014, 11:12 AM

## 2014-04-25 NOTE — Patient Instructions (Signed)
[facility] Discharge Instructions for Patients Receiving Chemotherapy  Today you received the following chemotherapy agents: taxol  To help prevent nausea and vomiting after your treatment, we encourage you to take your nausea medication.  Take it as often as prescribed.     If you develop nausea and vomiting that is not controlled by your nausea medication, call the clinic. If it is after clinic hours your family physician or the after hours number for the clinic or go to the Emergency Department.   BELOW ARE SYMPTOMS THAT SHOULD BE REPORTED IMMEDIATELY:  *FEVER GREATER THAN 100.5 F  *CHILLS WITH OR WITHOUT FEVER  NAUSEA AND VOMITING THAT IS NOT CONTROLLED WITH YOUR NAUSEA MEDICATION  *UNUSUAL SHORTNESS OF BREATH  *UNUSUAL BRUISING OR BLEEDING  TENDERNESS IN MOUTH AND THROAT WITH OR WITHOUT PRESENCE OF ULCERS  *URINARY PROBLEMS  *BOWEL PROBLEMS  UNUSUAL RASH Items with * indicate a potential emergency and should be followed up as soon as possible.  Feel free to call the clinic you have any questions or concerns. The clinic phone number is [phone].   I have been informed and understand all the instructions given to me. I know to contact the clinic, my physician, or go to the Emergency Department if any problems should occur. I do not have any questions at this time, but understand that I may call the clinic during office hours   should I have any questions or need assistance in obtaining follow up care.    __________________________________________  _____________  __________ Signature of Patient or Authorized Representative            Date                   Time    __________________________________________ Nurse's Signature

## 2014-04-25 NOTE — Patient Instructions (Signed)

## 2014-04-25 NOTE — Telephone Encounter (Signed)
appts made and avs will be printed in chemo  anne

## 2014-04-26 ENCOUNTER — Ambulatory Visit (HOSPITAL_BASED_OUTPATIENT_CLINIC_OR_DEPARTMENT_OTHER): Payer: Medicare Other

## 2014-04-26 DIAGNOSIS — C482 Malignant neoplasm of peritoneum, unspecified: Secondary | ICD-10-CM | POA: Diagnosis not present

## 2014-04-26 DIAGNOSIS — D701 Agranulocytosis secondary to cancer chemotherapy: Secondary | ICD-10-CM | POA: Diagnosis not present

## 2014-04-26 DIAGNOSIS — C569 Malignant neoplasm of unspecified ovary: Secondary | ICD-10-CM

## 2014-04-26 MED ORDER — TBO-FILGRASTIM 300 MCG/0.5ML ~~LOC~~ SOSY
300.0000 ug | PREFILLED_SYRINGE | Freq: Once | SUBCUTANEOUS | Status: AC
  Administered 2014-04-26: 300 ug via SUBCUTANEOUS
  Filled 2014-04-26: qty 0.5

## 2014-04-27 ENCOUNTER — Telehealth: Payer: Self-pay

## 2014-04-27 ENCOUNTER — Telehealth: Payer: Self-pay | Admitting: Oncology

## 2014-04-27 DIAGNOSIS — C569 Malignant neoplasm of unspecified ovary: Secondary | ICD-10-CM

## 2014-04-27 NOTE — Telephone Encounter (Signed)
-----   Message from Gordy Levan, MD sent at 04/27/2014 10:20 AM EDT ----- Had first low dose taxol 3-14 and granix 3-15. Family event coming up this weekend. RN please check on her by phone re the treatment, also offer CBC on Friday 3-18/ possible additional granix on Friday to be sure counts ok for the family event thanks

## 2014-04-27 NOTE — Telephone Encounter (Signed)
Spoke with Olivia Benson.  She stated that so far she has not had the severe aches from the taxol being weekly but it ususlly hit her the 4 or 5th day of the cycle. She is agreeable to come in Friday 04-29-14 at 1015 for lab and possible Granix injection.

## 2014-04-27 NOTE — Telephone Encounter (Signed)
Per pof all injections have been moved to 24hrs after chemo and patient will get a new sch at 3/21 appt

## 2014-04-29 ENCOUNTER — Other Ambulatory Visit: Payer: Self-pay | Admitting: Oncology

## 2014-04-29 ENCOUNTER — Ambulatory Visit (HOSPITAL_BASED_OUTPATIENT_CLINIC_OR_DEPARTMENT_OTHER): Payer: Medicare Other

## 2014-04-29 ENCOUNTER — Other Ambulatory Visit (HOSPITAL_BASED_OUTPATIENT_CLINIC_OR_DEPARTMENT_OTHER): Payer: Medicare Other

## 2014-04-29 DIAGNOSIS — C569 Malignant neoplasm of unspecified ovary: Secondary | ICD-10-CM

## 2014-04-29 DIAGNOSIS — D701 Agranulocytosis secondary to cancer chemotherapy: Secondary | ICD-10-CM

## 2014-04-29 LAB — CBC WITH DIFFERENTIAL/PLATELET
BASO%: 1.5 % (ref 0.0–2.0)
Basophils Absolute: 0 10*3/uL (ref 0.0–0.1)
EOS%: 5.5 % (ref 0.0–7.0)
Eosinophils Absolute: 0.1 10*3/uL (ref 0.0–0.5)
HCT: 30.9 % — ABNORMAL LOW (ref 34.8–46.6)
HGB: 9.7 g/dL — ABNORMAL LOW (ref 11.6–15.9)
LYMPH%: 34.1 % (ref 14.0–49.7)
MCH: 28.2 pg (ref 25.1–34.0)
MCHC: 31.2 g/dL — ABNORMAL LOW (ref 31.5–36.0)
MCV: 90.3 fL (ref 79.5–101.0)
MONO#: 0 10*3/uL — ABNORMAL LOW (ref 0.1–0.9)
MONO%: 1.1 % (ref 0.0–14.0)
NEUT#: 1.5 10*3/uL (ref 1.5–6.5)
NEUT%: 57.8 % (ref 38.4–76.8)
Platelets: 100 10*3/uL — ABNORMAL LOW (ref 145–400)
RBC: 3.43 10*6/uL — ABNORMAL LOW (ref 3.70–5.45)
RDW: 17.2 % — ABNORMAL HIGH (ref 11.2–14.5)
WBC: 2.5 10*3/uL — ABNORMAL LOW (ref 3.9–10.3)
lymph#: 0.9 10*3/uL (ref 0.9–3.3)

## 2014-04-29 MED ORDER — TBO-FILGRASTIM 300 MCG/0.5ML ~~LOC~~ SOSY
300.0000 ug | PREFILLED_SYRINGE | Freq: Once | SUBCUTANEOUS | Status: AC
  Administered 2014-04-29: 300 ug via SUBCUTANEOUS
  Filled 2014-04-29: qty 0.5

## 2014-04-29 NOTE — Progress Notes (Signed)
Olivia Benson here for lab and possible injection.  ANC 1.5 today.  Spoke to Dr Marko Plume about lab results and she stated to give Granix injection due to drop in [place] from 4 days ago.  Patient aware and states understanding.

## 2014-05-01 ENCOUNTER — Other Ambulatory Visit: Payer: Self-pay | Admitting: Oncology

## 2014-05-02 ENCOUNTER — Telehealth: Payer: Self-pay | Admitting: Oncology

## 2014-05-02 ENCOUNTER — Encounter: Payer: Self-pay | Admitting: Oncology

## 2014-05-02 ENCOUNTER — Ambulatory Visit (HOSPITAL_BASED_OUTPATIENT_CLINIC_OR_DEPARTMENT_OTHER): Payer: Medicare Other | Admitting: Oncology

## 2014-05-02 ENCOUNTER — Other Ambulatory Visit (HOSPITAL_BASED_OUTPATIENT_CLINIC_OR_DEPARTMENT_OTHER): Payer: Medicare Other

## 2014-05-02 ENCOUNTER — Ambulatory Visit: Payer: Medicare Other

## 2014-05-02 ENCOUNTER — Other Ambulatory Visit: Payer: Medicare Other

## 2014-05-02 ENCOUNTER — Ambulatory Visit (HOSPITAL_BASED_OUTPATIENT_CLINIC_OR_DEPARTMENT_OTHER): Payer: Medicare Other

## 2014-05-02 VITALS — BP 134/65 | HR 72 | Temp 98.1°F | Resp 18 | Ht 61.0 in | Wt 173.8 lb

## 2014-05-02 DIAGNOSIS — E119 Type 2 diabetes mellitus without complications: Secondary | ICD-10-CM

## 2014-05-02 DIAGNOSIS — D701 Agranulocytosis secondary to cancer chemotherapy: Secondary | ICD-10-CM

## 2014-05-02 DIAGNOSIS — C482 Malignant neoplasm of peritoneum, unspecified: Secondary | ICD-10-CM

## 2014-05-02 DIAGNOSIS — K7581 Nonalcoholic steatohepatitis (NASH): Secondary | ICD-10-CM

## 2014-05-02 DIAGNOSIS — Z95828 Presence of other vascular implants and grafts: Secondary | ICD-10-CM

## 2014-05-02 DIAGNOSIS — C786 Secondary malignant neoplasm of retroperitoneum and peritoneum: Secondary | ICD-10-CM

## 2014-05-02 DIAGNOSIS — E1169 Type 2 diabetes mellitus with other specified complication: Secondary | ICD-10-CM

## 2014-05-02 DIAGNOSIS — C569 Malignant neoplasm of unspecified ovary: Secondary | ICD-10-CM

## 2014-05-02 DIAGNOSIS — T451X5A Adverse effect of antineoplastic and immunosuppressive drugs, initial encounter: Secondary | ICD-10-CM

## 2014-05-02 DIAGNOSIS — K7460 Unspecified cirrhosis of liver: Secondary | ICD-10-CM

## 2014-05-02 DIAGNOSIS — E669 Obesity, unspecified: Secondary | ICD-10-CM

## 2014-05-02 DIAGNOSIS — G620 Drug-induced polyneuropathy: Secondary | ICD-10-CM

## 2014-05-02 DIAGNOSIS — R161 Splenomegaly, not elsewhere classified: Secondary | ICD-10-CM

## 2014-05-02 DIAGNOSIS — G2581 Restless legs syndrome: Secondary | ICD-10-CM

## 2014-05-02 LAB — COMPREHENSIVE METABOLIC PANEL (CC13)
ALT: 48 U/L (ref 0–55)
AST: 28 U/L (ref 5–34)
Albumin: 3.8 g/dL (ref 3.5–5.0)
Alkaline Phosphatase: 124 U/L (ref 40–150)
Anion Gap: 12 mEq/L — ABNORMAL HIGH (ref 3–11)
BUN: 14.4 mg/dL (ref 7.0–26.0)
CO2: 19 mEq/L — ABNORMAL LOW (ref 22–29)
Calcium: 8.7 mg/dL (ref 8.4–10.4)
Chloride: 106 mEq/L (ref 98–109)
Creatinine: 1 mg/dL (ref 0.6–1.1)
EGFR: 56 mL/min/{1.73_m2} — ABNORMAL LOW (ref >90)
Glucose: 395 mg/dl — ABNORMAL HIGH (ref 70–140)
Potassium: 4.3 mEq/L (ref 3.5–5.1)
Sodium: 137 mEq/L (ref 136–145)
Total Bilirubin: 0.64 mg/dL (ref 0.20–1.20)
Total Protein: 7.2 g/dL (ref 6.4–8.3)

## 2014-05-02 LAB — CBC WITH DIFFERENTIAL/PLATELET
BASO%: 0.6 % (ref 0.0–2.0)
Basophils Absolute: 0 10*3/uL (ref 0.0–0.1)
EOS%: 0.1 % (ref 0.0–7.0)
Eosinophils Absolute: 0 10*3/uL (ref 0.0–0.5)
HCT: 31.7 % — ABNORMAL LOW (ref 34.8–46.6)
HGB: 9.8 g/dL — ABNORMAL LOW (ref 11.6–15.9)
LYMPH%: 24 % (ref 14.0–49.7)
MCH: 27.8 pg (ref 25.1–34.0)
MCHC: 31 g/dL — ABNORMAL LOW (ref 31.5–36.0)
MCV: 89.7 fL (ref 79.5–101.0)
MONO#: 0 10*3/uL — ABNORMAL LOW (ref 0.1–0.9)
MONO%: 1.8 % (ref 0.0–14.0)
NEUT#: 1.1 10*3/uL — ABNORMAL LOW (ref 1.5–6.5)
NEUT%: 73.5 % (ref 38.4–76.8)
Platelets: 99 10*3/uL — ABNORMAL LOW (ref 145–400)
RBC: 3.54 10*6/uL — ABNORMAL LOW (ref 3.70–5.45)
RDW: 17.9 % — ABNORMAL HIGH (ref 11.2–14.5)
WBC: 1.6 10*3/uL — ABNORMAL LOW (ref 3.9–10.3)
lymph#: 0.4 10*3/uL — ABNORMAL LOW (ref 0.9–3.3)

## 2014-05-02 MED ORDER — SODIUM CHLORIDE 0.9 % IJ SOLN
10.0000 mL | INTRAMUSCULAR | Status: DC | PRN
  Administered 2014-05-02: 10 mL via INTRAVENOUS
  Filled 2014-05-02: qty 10

## 2014-05-02 MED ORDER — TBO-FILGRASTIM 300 MCG/0.5ML ~~LOC~~ SOSY
300.0000 ug | PREFILLED_SYRINGE | Freq: Once | SUBCUTANEOUS | Status: AC
  Administered 2014-05-02: 300 ug via SUBCUTANEOUS
  Filled 2014-05-02: qty 0.5

## 2014-05-02 MED ORDER — HEPARIN SOD (PORK) LOCK FLUSH 100 UNIT/ML IV SOLN
500.0000 [IU] | Freq: Once | INTRAVENOUS | Status: AC
  Administered 2014-05-02: 500 [IU] via INTRAVENOUS
  Filled 2014-05-02: qty 5

## 2014-05-02 MED ORDER — SODIUM CHLORIDE 0.9 % IJ SOLN
10.0000 mL | INTRAMUSCULAR | Status: DC | PRN
Start: 2014-05-02 — End: 2014-05-02
  Administered 2014-05-02: 10 mL via INTRAVENOUS
  Filled 2014-05-02: qty 10

## 2014-05-02 NOTE — Progress Notes (Signed)
OFFICE PROGRESS NOTE   May 02, 2014   Physicians:Emma Barrington Ellison, MD (PCP), Luvenia Starch.Wert, P.Nahser, M.Stark/ J.Mann  INTERVAL HISTORY:  Patient is seen, together with significant other, in continuing attention to chemotherapy resumed 04-19-14 with carboplatin for primary peritoneal carcinoma, after interval debulking 03-29-14. She had day 8 cycle 1 dose dense taxol on 04-25-14 (this her first low dose taxol) with granix x 2, however ANC is too low for day 15 today. Situation is complicated by cirrhosis secondary to NASH, with splenomegaly and tendency to low platelets and neutropenia, also has DM and severe restless legs.  Patient had severe aching in LE particularly after #2 granix last week. Restless legs during chemo were still severe despite decrease in benadryl to 12.5 mg, so will DC benadryl entirely. Oxycodone, which she is very reluctant to use, was somewhat helpful with aches and also helps restless legs at hs.  Nausea has been controlled, bowels are moving adequately, surgical discomfort is improved and she has no significant cough. She has had no bleeding.  No increase peripheral neuropathy   PAC in Flu vaccine done Genetics testing from 02-2014 normal ([employer] panel)  Patient tells me that son was on 14 medications prior to his death, adding to her concern about using medications. She was able to attend family event at church this past weekend.  ONCOLOGIC HISTORY Patient initially had problems in ~ July 2015 with stress urinary incontinence related to severe chronic cough. Urine culture by PCP showed no infection and she was referred to Dr Clois Comber, who adjusted medications with great improvement in the cough. Despite improved cough, she continued with some urinary incontinence. She saw Dr Ronita Hipps in 10-2013, with pelvic mass on exam and CA 125 1195 on 11-08-13. She had CT AP 11-12-13, which showed large ascites, irregular and shrunken liver, mild splenomegaly, increased  soft tissue in pelvis bilaterally and anterior mid and lower peritoneum, small left pleural effusion. She saw Dr Denman George in consultation on 11-15-13, who agreed that this was likely gyn malignancy. 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 She had second paracentesis for 3.6 liters on 12-03-13. Gyn oncology recommended initial treatment with chemotherapy. She had first carboplatin and taxol on 12-07-13, with carboplatin dosed at AUC=4 and taxol at 135 mg/m2 due to concern for cirrhosis. She has needed gCSF support and even so was admitted with febrile neutropenia after cycle 3. CA125 was 1938 on 12-01-13 as baseline for chemotherapy, down to 130 prior to cycle 4 and 45 after cycle 5. 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 Chemo resumed 04-19-14.  regimen changed to dose dense carbo taxol.   Review of systems as above, also: No fever or symptoms of infection. Voiding ok. No problems with PAC. No SOB. Remainder of 10 point Review of Systems negative.  Objective:  Vital signs in last 24 hours:  BP 134/65 mmHg  Pulse 72  Temp(Src) 98.1 F (36.7 C) (Oral)  Resp 18  Ht 5' 1"  (1.549 m)  Wt 173 lb 12.8 oz (78.835 kg)  BMI 32.86 kg/m2 Weight up 3 lbs Alert, oriented and appropriate. Ambulatory without difficulty.  Alopecia  HEENT:PERRL, sclerae not icteric. Oral mucosa moist without lesions, posterior pharynx clear.  Neck supple. No JVD.  Lymphatics:no cervical,supraclavicular or  inguinal adenopathy Resp: clear to auscultation bilaterally and normal percussion bilaterally Cardio: regular rate and rhythm. No gallop. GI: soft, nontender, not distended, no mass or organomegaly. Normally active  bowel sounds. Surgical incision closed  Musculoskeletal/ Extremities: without pitting edema, cords, tenderness Neuro: no change peripheral neuropathy. Otherwise nonfocal. PSYCH appropriate mood and affect Skin without rash, ecchymosis, petechiae Portacath-without erythema or tenderness  Lab Results:  Results for orders placed or performed in visit on 05/02/14  CBC with Differential  Result Value Ref Range   WBC 1.6 (L) 3.9 - 10.3 10e3/uL   NEUT# 1.1 (L) 1.5 - 6.5 10e3/uL   HGB 9.8 (L) 11.6 - 15.9 g/dL   HCT 31.7 (L) 34.8 - 46.6 %   Platelets 99 (L) 145 - 400 10e3/uL   MCV 89.7 79.5 - 101.0 fL   MCH 27.8 25.1 - 34.0 pg   MCHC 31.0 (L) 31.5 - 36.0 g/dL   RBC 3.54 (L) 3.70 - 5.45 10e6/uL   RDW 17.9 (H) 11.2 - 14.5 %   lymph# 0.4 (L) 0.9 - 3.3 10e3/uL   MONO# 0.0 (L) 0.1 - 0.9 10e3/uL   Eosinophils Absolute 0.0 0.0 - 0.5 10e3/uL   Basophils Absolute 0.0 0.0 - 0.1 10e3/uL   NEUT% 73.5 38.4 - 76.8 %   LYMPH% 24.0 14.0 - 49.7 %   MONO% 1.8 0.0 - 14.0 %   EOS% 0.1 0.0 - 7.0 %   BASO% 0.6 0.0 - 2.0 %  Comprehensive metabolic panel (Cmet) - [facility]  Result Value Ref Range   Sodium 137 136 - 145 mEq/L   Potassium 4.3 3.5 - 5.1 mEq/L   Chloride 106 98 - 109 mEq/L   CO2 19 (L) 22 - 29 mEq/L   Glucose 395 (H) 70 - 140 mg/dl   BUN 14.4 7.0 - 26.0 mg/dL   Creatinine 1.0 0.6 - 1.1 mg/dL   Total Bilirubin 0.64 0.20 - 1.20 mg/dL   Alkaline Phosphatase 124 40 - 150 U/L   AST 28 5 - 34 U/L   ALT 48 0 - 55 U/L   Total Protein 7.2 6.4 - 8.3 g/dL   Albumin 3.8 3.5 - 5.0 g/dL   Calcium 8.7 8.4 - 10.4 mg/dL   Anion Gap 12 (H) 3 - 11 mEq/L   EGFR 56 (L) >90 ml/min/1.73 m2    CA 125 on 2-4 prior to surgery 2-16 was 45, up to 122 post op on 04-18-14. Blood sugar elevated today with premed decadron.  Studies/Results:  No results found.  Medications: I have reviewed the patient's current medications. No day 15 cycle 1 chemo due to low counts. Granix today, will not repeat this on 3-22. Benadryl  DCd from chemo orders. Encouraged her to use oxycodone today due to granix injection and oxycodone at hs if this improves restless legs to allow sleep. She can increase laxatives as needed with pain medication  DISCUSSION: Blood counts today, meds as above.    Assessment/Plan:  1.high grade serous primary peritoneal carcinoma: Neoadjuvant chemotherapy with carboplatin and taxol begun 12-07-13 and cycle 5 03-08-14 with neulasta. Interval optimal debulking of what was still large amount of disease, with residual TNTC <52m tumor areas. Resumed chemotherapy, regimen changed to weekly dose dense carbo taxol beginning 04-19-14 (carbo only day 1 cycle 1). Day 15 cycle 1 held today with ANC 1.1 despite granix. Will give day 1 cycle 2 on 3-28 if counts adequate, with carbo dose further reduced now, + granix at least day 2 and possibly also  day 3. I will see her again on 3-28. 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 Follow liver function closely with chemo.Known to Dr Lucio Edward. 3.PAC in 4.diabetes on oral agent, does not follow blood sugars regularly at home. Cover with SS with chemo treatments 5.stress urinary incontinence no worse 6.chronic cough: better with medications by Dr [name]. Past tobacco 7. 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 Prophylactic lovenox used post op and completed. 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 Tapering down but intolerable hot flashes now, increase back to 1/2 tab daily from present 1/2 tab qod. 11.narrow angle glaucoma: following with ophthalmology 12.restless legs:  severe, interferes with sleep.As above 13.chemo peripheral neuropathy: no worse since most recent treatment.   Chemo orders adjusted, granix as above. All questions answered and patient is in agreement  with plans as above. Time spent 25 min including >50% counseling and coordination of care.   LIVESAY,LENNIS P, MD   05/02/2014, 12:17 PM

## 2014-05-02 NOTE — Patient Instructions (Signed)

## 2014-05-02 NOTE — Addendum Note (Signed)
Addended by: Baruch Merl on: 05/02/2014 12:59 PM   Modules accepted: Orders, SmartSet

## 2014-05-02 NOTE — Telephone Encounter (Signed)
per pof to CX pt appt-pt aware-CX inj

## 2014-05-03 ENCOUNTER — Ambulatory Visit: Payer: Medicare Other

## 2014-05-04 ENCOUNTER — Ambulatory Visit: Payer: Medicare Other

## 2014-05-08 ENCOUNTER — Other Ambulatory Visit: Payer: Self-pay | Admitting: Oncology

## 2014-05-08 DIAGNOSIS — C482 Malignant neoplasm of peritoneum, unspecified: Secondary | ICD-10-CM | POA: Insufficient documentation

## 2014-05-08 DIAGNOSIS — C569 Malignant neoplasm of unspecified ovary: Secondary | ICD-10-CM

## 2014-05-09 ENCOUNTER — Ambulatory Visit (HOSPITAL_BASED_OUTPATIENT_CLINIC_OR_DEPARTMENT_OTHER): Payer: Medicare Other

## 2014-05-09 ENCOUNTER — Ambulatory Visit (HOSPITAL_COMMUNITY)
Admission: RE | Admit: 2014-05-09 | Discharge: 2014-05-09 | Disposition: A | Discharge status: Home / Self Care | Payer: Medicare Other | Source: Ambulatory Visit | Attending: Oncology | Admitting: Oncology

## 2014-05-09 ENCOUNTER — Ambulatory Visit (HOSPITAL_BASED_OUTPATIENT_CLINIC_OR_DEPARTMENT_OTHER): Payer: Medicare Other | Admitting: Oncology

## 2014-05-09 ENCOUNTER — Ambulatory Visit: Payer: Medicare Other

## 2014-05-09 ENCOUNTER — Other Ambulatory Visit (HOSPITAL_BASED_OUTPATIENT_CLINIC_OR_DEPARTMENT_OTHER): Payer: Medicare Other

## 2014-05-09 ENCOUNTER — Other Ambulatory Visit: Payer: Medicare Other

## 2014-05-09 ENCOUNTER — Encounter: Payer: Self-pay | Admitting: Oncology

## 2014-05-09 ENCOUNTER — Telehealth: Payer: Self-pay | Admitting: Oncology

## 2014-05-09 VITALS — BP 147/62 | HR 75 | Temp 98.4°F | Resp 18 | Ht 61.0 in | Wt 179.1 lb

## 2014-05-09 DIAGNOSIS — C482 Malignant neoplasm of peritoneum, unspecified: Secondary | ICD-10-CM

## 2014-05-09 DIAGNOSIS — Z95828 Presence of other vascular implants and grafts: Secondary | ICD-10-CM

## 2014-05-09 DIAGNOSIS — Z5111 Encounter for antineoplastic chemotherapy: Secondary | ICD-10-CM | POA: Diagnosis not present

## 2014-05-09 DIAGNOSIS — R180 Malignant ascites: Secondary | ICD-10-CM

## 2014-05-09 DIAGNOSIS — G2581 Restless legs syndrome: Secondary | ICD-10-CM | POA: Diagnosis not present

## 2014-05-09 DIAGNOSIS — C562 Malignant neoplasm of left ovary: Secondary | ICD-10-CM

## 2014-05-09 DIAGNOSIS — C561 Malignant neoplasm of right ovary: Secondary | ICD-10-CM

## 2014-05-09 DIAGNOSIS — C569 Malignant neoplasm of unspecified ovary: Secondary | ICD-10-CM | POA: Diagnosis not present

## 2014-05-09 DIAGNOSIS — K7460 Unspecified cirrhosis of liver: Secondary | ICD-10-CM

## 2014-05-09 DIAGNOSIS — E119 Type 2 diabetes mellitus without complications: Secondary | ICD-10-CM

## 2014-05-09 DIAGNOSIS — L905 Scar conditions and fibrosis of skin: Secondary | ICD-10-CM | POA: Diagnosis present

## 2014-05-09 DIAGNOSIS — D72819 Decreased white blood cell count, unspecified: Secondary | ICD-10-CM

## 2014-05-09 DIAGNOSIS — Z9889 Other specified postprocedural states: Secondary | ICD-10-CM

## 2014-05-09 DIAGNOSIS — D701 Agranulocytosis secondary to cancer chemotherapy: Secondary | ICD-10-CM

## 2014-05-09 DIAGNOSIS — E1169 Type 2 diabetes mellitus with other specified complication: Secondary | ICD-10-CM

## 2014-05-09 DIAGNOSIS — K7581 Nonalcoholic steatohepatitis (NASH): Secondary | ICD-10-CM | POA: Diagnosis not present

## 2014-05-09 DIAGNOSIS — E669 Obesity, unspecified: Secondary | ICD-10-CM

## 2014-05-09 DIAGNOSIS — R161 Splenomegaly, not elsewhere classified: Secondary | ICD-10-CM

## 2014-05-09 DIAGNOSIS — C786 Secondary malignant neoplasm of retroperitoneum and peritoneum: Secondary | ICD-10-CM

## 2014-05-09 DIAGNOSIS — T451X5A Adverse effect of antineoplastic and immunosuppressive drugs, initial encounter: Secondary | ICD-10-CM

## 2014-05-09 LAB — CBC WITH DIFFERENTIAL/PLATELET
BASO%: 0 % (ref 0.0–2.0)
Basophils Absolute: 0 10*3/uL (ref 0.0–0.1)
EOS%: 0 % (ref 0.0–7.0)
Eosinophils Absolute: 0 10*3/uL (ref 0.0–0.5)
HCT: 31.5 % — ABNORMAL LOW (ref 34.8–46.6)
HGB: 10.1 g/dL — ABNORMAL LOW (ref 11.6–15.9)
LYMPH%: 16.5 % (ref 14.0–49.7)
MCH: 28.1 pg (ref 25.1–34.0)
MCHC: 32.1 g/dL (ref 31.5–36.0)
MCV: 87.7 fL (ref 79.5–101.0)
MONO#: 0 10*3/uL — ABNORMAL LOW (ref 0.1–0.9)
MONO%: 0.9 % (ref 0.0–14.0)
NEUT#: 1.9 10*3/uL (ref 1.5–6.5)
NEUT%: 82.6 % — ABNORMAL HIGH (ref 38.4–76.8)
Platelets: 113 10*3/uL — ABNORMAL LOW (ref 145–400)
RBC: 3.59 10*6/uL — ABNORMAL LOW (ref 3.70–5.45)
RDW: 15.8 % — ABNORMAL HIGH (ref 11.2–14.5)
WBC: 2.2 10*3/uL — ABNORMAL LOW (ref 3.9–10.3)
lymph#: 0.4 10*3/uL — ABNORMAL LOW (ref 0.9–3.3)

## 2014-05-09 LAB — COMPREHENSIVE METABOLIC PANEL (CC13)
ALT: 40 U/L (ref 0–55)
AST: 24 U/L (ref 5–34)
Albumin: 3.6 g/dL (ref 3.5–5.0)
Alkaline Phosphatase: 128 U/L (ref 40–150)
Anion Gap: 12 mEq/L — ABNORMAL HIGH (ref 3–11)
BUN: 12.3 mg/dL (ref 7.0–26.0)
CO2: 19 mEq/L — ABNORMAL LOW (ref 22–29)
Calcium: 8.5 mg/dL (ref 8.4–10.4)
Chloride: 108 mEq/L (ref 98–109)
Creatinine: 0.9 mg/dL (ref 0.6–1.1)
EGFR: 65 mL/min/{1.73_m2} — ABNORMAL LOW (ref >90)
Glucose: 347 mg/dl — ABNORMAL HIGH (ref 70–140)
Potassium: 4.5 mEq/L (ref 3.5–5.1)
Sodium: 139 mEq/L (ref 136–145)
Total Bilirubin: 0.62 mg/dL (ref 0.20–1.20)
Total Protein: 7 g/dL (ref 6.4–8.3)

## 2014-05-09 MED ORDER — DEXTROSE 5 % IV SOLN
80.0000 mg/m2 | Freq: Once | INTRAVENOUS | Status: AC
  Administered 2014-05-09: 150 mg via INTRAVENOUS
  Filled 2014-05-09: qty 25

## 2014-05-09 MED ORDER — FAMOTIDINE IN NACL 20-0.9 MG/50ML-% IV SOLN
20.0000 mg | Freq: Once | INTRAVENOUS | Status: AC
  Administered 2014-05-09: 20 mg via INTRAVENOUS

## 2014-05-09 MED ORDER — CARBOPLATIN CHEMO INTRADERMAL TEST DOSE 100MCG/0.02ML
100.0000 ug | Freq: Once | INTRADERMAL | Status: AC
  Administered 2014-05-09: 100 ug via INTRADERMAL
  Filled 2014-05-09: qty 0.01

## 2014-05-09 MED ORDER — SODIUM CHLORIDE 0.9 % IJ SOLN
10.0000 mL | INTRAMUSCULAR | Status: DC | PRN
  Administered 2014-05-09: 10 mL
  Filled 2014-05-09: qty 10

## 2014-05-09 MED ORDER — DIPHENHYDRAMINE HCL 50 MG/ML IJ SOLN
INTRAMUSCULAR | Status: AC
  Filled 2014-05-09: qty 1

## 2014-05-09 MED ORDER — DEXAMETHASONE SODIUM PHOSPHATE 100 MG/10ML IJ SOLN
Freq: Once | INTRAMUSCULAR | Status: AC
  Administered 2014-05-09: 13:00:00 via INTRAVENOUS
  Filled 2014-05-09: qty 8

## 2014-05-09 MED ORDER — OXYCODONE HCL 5 MG PO TABS: ORAL_TABLET | ORAL | Status: DC

## 2014-05-09 MED ORDER — HEPARIN SOD (PORK) LOCK FLUSH 100 UNIT/ML IV SOLN
500.0000 [IU] | Freq: Once | INTRAVENOUS | Status: AC | PRN
  Administered 2014-05-09: 500 [IU]
  Filled 2014-05-09: qty 5

## 2014-05-09 MED ORDER — SODIUM CHLORIDE 0.9 % IV SOLN
Freq: Once | INTRAVENOUS | Status: AC
  Administered 2014-05-09: 12:00:00 via INTRAVENOUS

## 2014-05-09 MED ORDER — CARBOPLATIN CHEMO INJECTION 450 MG/45ML
331.1000 mg | Freq: Once | INTRAVENOUS | Status: AC
  Administered 2014-05-09: 330 mg via INTRAVENOUS
  Filled 2014-05-09: qty 33

## 2014-05-09 MED ORDER — FAMOTIDINE IN NACL 20-0.9 MG/50ML-% IV SOLN
INTRAVENOUS | Status: AC
  Filled 2014-05-09: qty 50

## 2014-05-09 MED ORDER — SODIUM CHLORIDE 0.9 % IJ SOLN
10.0000 mL | INTRAMUSCULAR | Status: DC | PRN
  Administered 2014-05-09: 10 mL via INTRAVENOUS
  Filled 2014-05-09: qty 10

## 2014-05-09 NOTE — Patient Instructions (Signed)

## 2014-05-09 NOTE — Progress Notes (Signed)
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 She had 5 cycles of carboplatin taxol from 03-09-13 thru 03-08-14, then interval debulking 03-29-14; plan is 3 cycles more of chemotherapy after surgery.  Day 15 cycle 1 was held on 05-02-14 due to leukopenia despite granix. She is due day 1 cycle 2 today and will have granix on 3-29 and 05-11-14.   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   She has been much more comfortable in low back and improvement in restless legs using oxyIR total 10 mg at supper, even able to sleep thru night all week since she has started this. She has felt new firm area at mid ventral incision, not tender. Bowels are moving daily with increased miralax with the increased pain medication as above. Appetite is great, has not been watching diet. Feels more full in abdomen generally.    PAC in Flu vaccine done Genetics testing from 02-2014 normal ([employer] panel)  ONCOLOGIC HISTORY Patient initially had problems in ~ July 2015 with stress urinary incontinence related to severe chronic cough. Urine culture by PCP showed no infection and she was referred to Dr Clois Comber, who adjusted medications with great improvement in the cough. Despite improved cough, she continued with some urinary incontinence. She saw Dr Ronita Hipps in 10-2013, with pelvic mass on exam and CA 125 1195 on 11-08-13. She had CT AP 11-12-13, which showed large ascites, irregular and shrunken liver, mild splenomegaly, increased soft tissue in pelvis bilaterally and anterior mid and lower peritoneum, small left pleural effusion. She saw Dr Denman George in consultation on 11-15-13, who agreed that this was likely  gyn malignancy. 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 She had second paracentesis for 3.6 liters on 12-03-13. Gyn oncology recommended initial treatment with chemotherapy. She had first carboplatin and taxol on 12-07-13, with carboplatin dosed at AUC=4 and taxol at 135 mg/m2 due to concern for cirrhosis. She has needed gCSF support and even so was admitted with febrile neutropenia after cycle 3. CA125 was 1938 on 12-01-13 as baseline for chemotherapy, down to 130 prior to cycle 4 and 45 after cycle 5. 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 Chemo resumed 04-19-14. regimen changed to dose dense carbo taxol.    Review of systems as above, also: Hair is growing back. No SOB. No fever or symptoms of infection. Sneezing and some cough x 1 day last week thought environmental allergies, otherwise no cough. Bladder unchanged. Feels some swelling in LE also. Remainder of 10 point Review of Systems negative.  Objective:  Vital signs in last 24 hours:  BP 147/62 mmHg  Pulse 75  Temp(Src) 98.4 F (36.9 C) (Oral)  Resp 18  Ht 5' 1"  (1.549 m)  Wt 179 lb 1.6 oz (81.239 kg)  BMI 33.86 kg/m2 Weight is up 6 lbs Alert, oriented and appropriate. Ambulatory without difficulty.  Hair is growing.  HEENT:PERRL, sclerae not icteric. Oral mucosa moist without lesions, posterior pharynx clear.  Neck supple. No JVD.  Lymphatics:no  cervical,supraclavicular adenopathy Resp: clear to auscultation bilaterally and normal percussion bilaterally Cardio: regular rate and rhythm. No gallop. GI: soft, nontender, not distended. Firm area at middle of ventral surgical scar ~ 2 cm, not tender. Normally active bowel  sounds.  Musculoskeletal/ Extremities: LE without pitting edema, cords, tenderness Neuro: no  Increased peripheral neuropathy. Otherwise nonfocal. PSYCH appropriate mood and affect Skin without rash, ecchymosis, petechiae Portacath-without erythema or tenderness  Lab Results:  Results for orders placed or performed in visit on 05/09/14  CBC with Differential  Result Value Ref Range   WBC 2.2 (L) 3.9 - 10.3 10e3/uL   NEUT# 1.9 1.5 - 6.5 10e3/uL   HGB 10.1 (L) 11.6 - 15.9 g/dL   HCT 31.5 (L) 34.8 - 46.6 %   Platelets 113 (L) 145 - 400 10e3/uL   MCV 87.7 79.5 - 101.0 fL   MCH 28.1 25.1 - 34.0 pg   MCHC 32.1 31.5 - 36.0 g/dL   RBC 3.59 (L) 3.70 - 5.45 10e6/uL   RDW 15.8 (H) 11.2 - 14.5 %   lymph# 0.4 (L) 0.9 - 3.3 10e3/uL   MONO# 0.0 (L) 0.1 - 0.9 10e3/uL   Eosinophils Absolute 0.0 0.0 - 0.5 10e3/uL   Basophils Absolute 0.0 0.0 - 0.1 10e3/uL   NEUT% 82.6 (H) 38.4 - 76.8 %   LYMPH% 16.5 14.0 - 49.7 %   MONO% 0.9 0.0 - 14.0 %   EOS% 0.0 0.0 - 7.0 %   BASO% 0.0 0.0 - 2.0 %    CMET has glucose 347 on steroids, T bili 0.62, other LFTs WNL, creat 0.9  CA 125 04-18-14    122 , note surgery 03-29-14.  CA 125 had been 45 on 03-18-14.   Studies/Results: Abdominal US done after visit LIMITED ABDOMINAL ULTRASOUND  COMPARISON: None.  FINDINGS: No cystic or solid lesions noted in the region of clinical concern. No evidence of bowel herniation.  IMPRESSION: Negative exam. No evidence of hernia  Medications: I have reviewed the patient's current medications. Carbo test dose prior to each carboplatin administration.  DISCUSSION: encouraged patient to continue the oxyIR 10 mg at hs. She will have granix days 2,3,9,16.  Assessment/Plan:   1.high grade serous primary peritoneal carcinoma: Neoadjuvant chemotherapy with carboplatin and taxol 12-07-13 thru cycle 5 03-08-14 with neulasta. Interval optimal debulking of what was still large amount of disease, with residual TNTC <54m tumor  areas. Resumed chemotherapy, regimen changed to weekly dose dense carbo taxol beginning 04-19-14 (carbo only day 1 cycle 1). Day 15 cycle 1 held with ANC 1.1 despite granix. Day 1 cycle 2 today with further dose reduction in carboplatin and granix days 2 and 3. She will have day 8 cycle 2 on 05-16-14 if ANC >=1.4 and plt >=100k, granix at least day 9. I will see her with day 15 cycle 2 on 05-23-14. 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 Follow liver function closely with chemo.Known to Dr MLucio Edward 3.PAC in 4.diabetes on oral agent, does not follow blood sugars regularly at home. Cover with SS with chemo treatments 5.stress urinary incontinence no worse 6.chronic cough: better with medications by Dr WMelvyn Novas Past tobacco 7. 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 Tapering down but intolerable hot flashes now, increase back to 1/2 tab daily from present 1/2 tab qod. 11.narrow angle  glaucoma: following with ophthalmology 12.restless legs: severe, interferes with sleep. Has not responded to multiple treatment attempts, but better this week with oxyIR as above.  13.chemo peripheral neuropathy: no worse since most recent treatment. 14.firm area palpable at ventral incision, no evidence of hernia by [place] after visit today. No mention of ascites on that [place] report.  Chemo orders confirmed, granix orders placed. All questions answered. Time spent 25 min including >50% counseling and coordination of care.   LIVESAY,LENNIS P, MD   05/09/2014, 10:56 AM

## 2014-05-09 NOTE — Telephone Encounter (Signed)
Appointments made and avs pritned for patient,will call for work in ultrasound

## 2014-05-09 NOTE — Patient Instructions (Signed)
[facility] Discharge Instructions for Patients Receiving Chemotherapy  Today you received the following chemotherapy agents Paclitaxel/Carboplatin.   To help prevent nausea and vomiting after your treatment, we encourage you to take your nausea medication as directed.    If you develop nausea and vomiting that is not controlled by your nausea medication, call the clinic.   BELOW ARE SYMPTOMS THAT SHOULD BE REPORTED IMMEDIATELY:  *FEVER GREATER THAN 100.5 F  *CHILLS WITH OR WITHOUT FEVER  NAUSEA AND VOMITING THAT IS NOT CONTROLLED WITH YOUR NAUSEA MEDICATION  *UNUSUAL SHORTNESS OF BREATH  *UNUSUAL BRUISING OR BLEEDING  TENDERNESS IN MOUTH AND THROAT WITH OR WITHOUT PRESENCE OF ULCERS  *URINARY PROBLEMS  *BOWEL PROBLEMS  UNUSUAL RASH Items with * indicate a potential emergency and should be followed up as soon as possible.  Feel free to call the clinic you have any questions or concerns. The clinic phone number is [phone].

## 2014-05-10 ENCOUNTER — Telehealth: Payer: Self-pay | Admitting: *Deleted

## 2014-05-10 ENCOUNTER — Ambulatory Visit (HOSPITAL_BASED_OUTPATIENT_CLINIC_OR_DEPARTMENT_OTHER): Payer: Medicare Other

## 2014-05-10 DIAGNOSIS — C561 Malignant neoplasm of right ovary: Secondary | ICD-10-CM

## 2014-05-10 DIAGNOSIS — Z5189 Encounter for other specified aftercare: Secondary | ICD-10-CM | POA: Diagnosis not present

## 2014-05-10 DIAGNOSIS — T451X5A Adverse effect of antineoplastic and immunosuppressive drugs, initial encounter: Secondary | ICD-10-CM | POA: Insufficient documentation

## 2014-05-10 DIAGNOSIS — E119 Type 2 diabetes mellitus without complications: Secondary | ICD-10-CM | POA: Insufficient documentation

## 2014-05-10 DIAGNOSIS — C569 Malignant neoplasm of unspecified ovary: Secondary | ICD-10-CM

## 2014-05-10 DIAGNOSIS — E1169 Type 2 diabetes mellitus with other specified complication: Secondary | ICD-10-CM | POA: Insufficient documentation

## 2014-05-10 DIAGNOSIS — Z95828 Presence of other vascular implants and grafts: Secondary | ICD-10-CM | POA: Insufficient documentation

## 2014-05-10 DIAGNOSIS — D701 Agranulocytosis secondary to cancer chemotherapy: Secondary | ICD-10-CM | POA: Insufficient documentation

## 2014-05-10 DIAGNOSIS — E669 Obesity, unspecified: Secondary | ICD-10-CM

## 2014-05-10 DIAGNOSIS — R161 Splenomegaly, not elsewhere classified: Secondary | ICD-10-CM | POA: Insufficient documentation

## 2014-05-10 MED ORDER — TBO-FILGRASTIM 300 MCG/0.5ML ~~LOC~~ SOSY
300.0000 ug | PREFILLED_SYRINGE | Freq: Once | SUBCUTANEOUS | Status: AC
  Administered 2014-05-10: 300 ug via SUBCUTANEOUS
  Filled 2014-05-10: qty 0.5

## 2014-05-10 NOTE — Telephone Encounter (Signed)
Called pt per Dr Mariana Kaufman instructions & informed that U/S did not show any bowel loops into a hernia nor any other problem along the scar & it could be some thick scarring or fatty tissue that she felt.  She suggested that we would check the area again with the next CT.  Pt appreciated message.

## 2014-05-10 NOTE — Telephone Encounter (Signed)
-----   Message from Gordy Levan, MD sent at 05/09/2014  9:21 PM EDT ----- Labs seen and need follow up: please let her know that [place] did not show any bowel loops into a hernia, and could not tell any other problems along the scar. It may be just thick scarring or some fatty tissue that we feel, and we will check the area again with the next CT.

## 2014-05-11 ENCOUNTER — Ambulatory Visit: Payer: Medicare Other

## 2014-05-11 ENCOUNTER — Ambulatory Visit (HOSPITAL_BASED_OUTPATIENT_CLINIC_OR_DEPARTMENT_OTHER): Payer: Medicare Other

## 2014-05-11 DIAGNOSIS — Z5189 Encounter for other specified aftercare: Secondary | ICD-10-CM | POA: Diagnosis not present

## 2014-05-11 DIAGNOSIS — C569 Malignant neoplasm of unspecified ovary: Secondary | ICD-10-CM

## 2014-05-11 MED ORDER — TBO-FILGRASTIM 300 MCG/0.5ML ~~LOC~~ SOSY
300.0000 ug | PREFILLED_SYRINGE | Freq: Once | SUBCUTANEOUS | Status: AC
  Administered 2014-05-11: 300 ug via SUBCUTANEOUS
  Filled 2014-05-11: qty 0.5

## 2014-05-13 ENCOUNTER — Other Ambulatory Visit: Payer: Self-pay | Admitting: *Deleted

## 2014-05-15 ENCOUNTER — Other Ambulatory Visit: Payer: Self-pay | Admitting: Oncology

## 2014-05-16 ENCOUNTER — Ambulatory Visit (HOSPITAL_BASED_OUTPATIENT_CLINIC_OR_DEPARTMENT_OTHER): Payer: Medicare Other

## 2014-05-16 ENCOUNTER — Other Ambulatory Visit (HOSPITAL_BASED_OUTPATIENT_CLINIC_OR_DEPARTMENT_OTHER): Payer: Medicare Other

## 2014-05-16 ENCOUNTER — Ambulatory Visit: Payer: Medicare Other

## 2014-05-16 ENCOUNTER — Other Ambulatory Visit: Payer: Medicare Other

## 2014-05-16 DIAGNOSIS — C569 Malignant neoplasm of unspecified ovary: Secondary | ICD-10-CM

## 2014-05-16 DIAGNOSIS — D701 Agranulocytosis secondary to cancer chemotherapy: Secondary | ICD-10-CM

## 2014-05-16 DIAGNOSIS — Z95828 Presence of other vascular implants and grafts: Secondary | ICD-10-CM

## 2014-05-16 LAB — CBC WITH DIFFERENTIAL/PLATELET
BASO%: 1 % (ref 0.0–2.0)
Basophils Absolute: 0 10*3/uL (ref 0.0–0.1)
EOS%: 0.1 % (ref 0.0–7.0)
Eosinophils Absolute: 0 10*3/uL (ref 0.0–0.5)
HCT: 30.8 % — ABNORMAL LOW (ref 34.8–46.6)
HGB: 10 g/dL — ABNORMAL LOW (ref 11.6–15.9)
LYMPH%: 25.9 % (ref 14.0–49.7)
MCH: 27.8 pg (ref 25.1–34.0)
MCHC: 32.5 g/dL (ref 31.5–36.0)
MCV: 85.7 fL (ref 79.5–101.0)
MONO#: 0 10*3/uL — ABNORMAL LOW (ref 0.1–0.9)
MONO%: 1 % (ref 0.0–14.0)
NEUT#: 0.9 10*3/uL — ABNORMAL LOW (ref 1.5–6.5)
NEUT%: 72 % (ref 38.4–76.8)
Platelets: 102 10*3/uL — ABNORMAL LOW (ref 145–400)
RBC: 3.59 10*6/uL — ABNORMAL LOW (ref 3.70–5.45)
RDW: 17.9 % — ABNORMAL HIGH (ref 11.2–14.5)
WBC: 1.2 10*3/uL — ABNORMAL LOW (ref 3.9–10.3)
lymph#: 0.3 10*3/uL — ABNORMAL LOW (ref 0.9–3.3)

## 2014-05-16 LAB — COMPREHENSIVE METABOLIC PANEL (CC13)
ALT: 62 U/L — ABNORMAL HIGH (ref 0–55)
AST: 35 U/L — ABNORMAL HIGH (ref 5–34)
Albumin: 3.9 g/dL (ref 3.5–5.0)
Alkaline Phosphatase: 135 U/L (ref 40–150)
Anion Gap: 15 mEq/L — ABNORMAL HIGH (ref 3–11)
BUN: 12.8 mg/dL (ref 7.0–26.0)
CO2: 17 mEq/L — ABNORMAL LOW (ref 22–29)
Calcium: 8.2 mg/dL — ABNORMAL LOW (ref 8.4–10.4)
Chloride: 106 mEq/L (ref 98–109)
Creatinine: 0.9 mg/dL (ref 0.6–1.1)
EGFR: 70 mL/min/{1.73_m2} — ABNORMAL LOW (ref >90)
Glucose: 388 mg/dl — ABNORMAL HIGH (ref 70–140)
Potassium: 4.3 mEq/L (ref 3.5–5.1)
Sodium: 137 mEq/L (ref 136–145)
Total Bilirubin: 0.65 mg/dL (ref 0.20–1.20)
Total Protein: 7.1 g/dL (ref 6.4–8.3)

## 2014-05-16 MED ORDER — HEPARIN SOD (PORK) LOCK FLUSH 100 UNIT/ML IV SOLN
500.0000 [IU] | Freq: Once | INTRAVENOUS | Status: AC
  Administered 2014-05-16: 500 [IU] via INTRAVENOUS
  Filled 2014-05-16: qty 5

## 2014-05-16 MED ORDER — SODIUM CHLORIDE 0.9 % IJ SOLN
10.0000 mL | INTRAMUSCULAR | Status: DC | PRN
  Administered 2014-05-16: 10 mL via INTRAVENOUS
  Filled 2014-05-16: qty 10

## 2014-05-16 MED ORDER — TBO-FILGRASTIM 300 MCG/0.5ML ~~LOC~~ SOSY
300.0000 ug | PREFILLED_SYRINGE | Freq: Once | SUBCUTANEOUS | Status: AC
  Administered 2014-05-16: 300 ug via SUBCUTANEOUS
  Filled 2014-05-16: qty 0.5

## 2014-05-16 NOTE — Patient Instructions (Signed)

## 2014-05-16 NOTE — Progress Notes (Signed)
Pt here for Taxol.  WBC  1.2 ;  ANC  0.9;  Dr. Marko Plume notified.  Per md,  HOLD Chemo today;  Give Granix today and tomorrow 4/5.   Pt already has f/u appt with Dr. Marko Plume on 05/23/14. Explanations given to pt and husband.  Gave pt masks to wear for neutropenic precautions.

## 2014-05-17 ENCOUNTER — Ambulatory Visit (HOSPITAL_BASED_OUTPATIENT_CLINIC_OR_DEPARTMENT_OTHER): Payer: Medicare Other

## 2014-05-17 DIAGNOSIS — D701 Agranulocytosis secondary to cancer chemotherapy: Secondary | ICD-10-CM

## 2014-05-17 DIAGNOSIS — C569 Malignant neoplasm of unspecified ovary: Secondary | ICD-10-CM

## 2014-05-17 LAB — CA 125: CA 125: 30 U/mL (ref <35)

## 2014-05-17 MED ORDER — TBO-FILGRASTIM 300 MCG/0.5ML ~~LOC~~ SOSY
300.0000 ug | PREFILLED_SYRINGE | Freq: Once | SUBCUTANEOUS | Status: AC
  Administered 2014-05-17: 300 ug via SUBCUTANEOUS
  Filled 2014-05-17: qty 0.5

## 2014-05-18 ENCOUNTER — Ambulatory Visit: Payer: Medicare Other

## 2014-05-23 ENCOUNTER — Ambulatory Visit (HOSPITAL_BASED_OUTPATIENT_CLINIC_OR_DEPARTMENT_OTHER): Payer: Medicare Other

## 2014-05-23 ENCOUNTER — Ambulatory Visit (HOSPITAL_BASED_OUTPATIENT_CLINIC_OR_DEPARTMENT_OTHER): Payer: Medicare Other | Admitting: Oncology

## 2014-05-23 ENCOUNTER — Ambulatory Visit: Payer: Medicare Other

## 2014-05-23 ENCOUNTER — Other Ambulatory Visit (HOSPITAL_BASED_OUTPATIENT_CLINIC_OR_DEPARTMENT_OTHER): Payer: Medicare Other

## 2014-05-23 ENCOUNTER — Encounter: Payer: Self-pay | Admitting: Oncology

## 2014-05-23 VITALS — BP 108/49 | HR 71 | Temp 97.9°F | Wt 181.1 lb

## 2014-05-23 VITALS — BP 108/49 | HR 71 | Temp 97.9°F | Resp 18 | Ht 61.0 in | Wt 181.4 lb

## 2014-05-23 DIAGNOSIS — Z5111 Encounter for antineoplastic chemotherapy: Secondary | ICD-10-CM | POA: Diagnosis present

## 2014-05-23 DIAGNOSIS — C786 Secondary malignant neoplasm of retroperitoneum and peritoneum: Secondary | ICD-10-CM

## 2014-05-23 DIAGNOSIS — D6959 Other secondary thrombocytopenia: Secondary | ICD-10-CM

## 2014-05-23 DIAGNOSIS — C482 Malignant neoplasm of peritoneum, unspecified: Secondary | ICD-10-CM

## 2014-05-23 DIAGNOSIS — C569 Malignant neoplasm of unspecified ovary: Secondary | ICD-10-CM | POA: Diagnosis present

## 2014-05-23 DIAGNOSIS — G2581 Restless legs syndrome: Secondary | ICD-10-CM | POA: Diagnosis not present

## 2014-05-23 DIAGNOSIS — K7460 Unspecified cirrhosis of liver: Secondary | ICD-10-CM

## 2014-05-23 DIAGNOSIS — Z95828 Presence of other vascular implants and grafts: Secondary | ICD-10-CM

## 2014-05-23 DIAGNOSIS — T451X5A Adverse effect of antineoplastic and immunosuppressive drugs, initial encounter: Secondary | ICD-10-CM

## 2014-05-23 DIAGNOSIS — K7581 Nonalcoholic steatohepatitis (NASH): Secondary | ICD-10-CM

## 2014-05-23 DIAGNOSIS — D701 Agranulocytosis secondary to cancer chemotherapy: Secondary | ICD-10-CM

## 2014-05-23 DIAGNOSIS — R161 Splenomegaly, not elsewhere classified: Secondary | ICD-10-CM

## 2014-05-23 LAB — COMPREHENSIVE METABOLIC PANEL (CC13)
ALT: 49 U/L (ref 0–55)
AST: 28 U/L (ref 5–34)
Albumin: 3.7 g/dL (ref 3.5–5.0)
Alkaline Phosphatase: 140 U/L (ref 40–150)
Anion Gap: 12 mEq/L — ABNORMAL HIGH (ref 3–11)
BUN: 17 mg/dL (ref 7.0–26.0)
CO2: 18 mEq/L — ABNORMAL LOW (ref 22–29)
Calcium: 8.5 mg/dL (ref 8.4–10.4)
Chloride: 107 mEq/L (ref 98–109)
Creatinine: 0.9 mg/dL (ref 0.6–1.1)
EGFR: 67 mL/min/{1.73_m2} — ABNORMAL LOW (ref >90)
Glucose: 349 mg/dl — ABNORMAL HIGH (ref 70–140)
Potassium: 4.8 mEq/L (ref 3.5–5.1)
Sodium: 137 mEq/L (ref 136–145)
Total Bilirubin: 0.76 mg/dL (ref 0.20–1.20)
Total Protein: 7 g/dL (ref 6.4–8.3)

## 2014-05-23 LAB — CBC WITH DIFFERENTIAL/PLATELET
BASO%: 0 % (ref 0.0–2.0)
Basophils Absolute: 0 10*3/uL (ref 0.0–0.1)
EOS%: 0 % (ref 0.0–7.0)
Eosinophils Absolute: 0 10*3/uL (ref 0.0–0.5)
HCT: 32.9 % — ABNORMAL LOW (ref 34.8–46.6)
HGB: 10.3 g/dL — ABNORMAL LOW (ref 11.6–15.9)
LYMPH%: 18.2 % (ref 14.0–49.7)
MCH: 27.3 pg (ref 25.1–34.0)
MCHC: 31.3 g/dL — ABNORMAL LOW (ref 31.5–36.0)
MCV: 87.3 fL (ref 79.5–101.0)
MONO#: 0 10*3/uL — ABNORMAL LOW (ref 0.1–0.9)
MONO%: 1.4 % (ref 0.0–14.0)
NEUT#: 1.8 10*3/uL (ref 1.5–6.5)
NEUT%: 80.4 % — ABNORMAL HIGH (ref 38.4–76.8)
Platelets: 123 10*3/uL — ABNORMAL LOW (ref 145–400)
RBC: 3.77 10*6/uL (ref 3.70–5.45)
RDW: 16.7 % — ABNORMAL HIGH (ref 11.2–14.5)
WBC: 2.2 10*3/uL — ABNORMAL LOW (ref 3.9–10.3)
lymph#: 0.4 10*3/uL — ABNORMAL LOW (ref 0.9–3.3)

## 2014-05-23 MED ORDER — SODIUM CHLORIDE 0.9 % IV SOLN
Freq: Once | INTRAVENOUS | Status: AC
  Administered 2014-05-23: 10:00:00 via INTRAVENOUS
  Filled 2014-05-23: qty 4

## 2014-05-23 MED ORDER — HEPARIN SOD (PORK) LOCK FLUSH 100 UNIT/ML IV SOLN
500.0000 [IU] | Freq: Once | INTRAVENOUS | Status: AC
  Administered 2014-05-23: 500 [IU] via INTRAVENOUS
  Filled 2014-05-23: qty 5

## 2014-05-23 MED ORDER — SODIUM CHLORIDE 0.9 % IV SOLN
Freq: Once | INTRAVENOUS | Status: AC
  Administered 2014-05-23: 09:00:00 via INTRAVENOUS

## 2014-05-23 MED ORDER — FAMOTIDINE IN NACL 20-0.9 MG/50ML-% IV SOLN
20.0000 mg | Freq: Once | INTRAVENOUS | Status: AC
  Administered 2014-05-23: 20 mg via INTRAVENOUS

## 2014-05-23 MED ORDER — FAMOTIDINE IN NACL 20-0.9 MG/50ML-% IV SOLN
INTRAVENOUS | Status: AC
  Filled 2014-05-23: qty 50

## 2014-05-23 MED ORDER — SODIUM CHLORIDE 0.9 % IJ SOLN
10.0000 mL | INTRAMUSCULAR | Status: DC | PRN
  Administered 2014-05-23: 10 mL via INTRAVENOUS
  Filled 2014-05-23: qty 10

## 2014-05-23 MED ORDER — DEXAMETHASONE 4 MG PO TABS: ORAL_TABLET | ORAL | Status: DC

## 2014-05-23 MED ORDER — DEXTROSE 5 % IV SOLN
80.0000 mg/m2 | Freq: Once | INTRAVENOUS | Status: AC
  Administered 2014-05-23: 150 mg via INTRAVENOUS
  Filled 2014-05-23: qty 25

## 2014-05-23 MED ORDER — HEPARIN SOD (PORK) LOCK FLUSH 100 UNIT/ML IV SOLN
500.0000 [IU] | Freq: Once | INTRAVENOUS | Status: DC
  Filled 2014-05-23: qty 5

## 2014-05-23 NOTE — Progress Notes (Signed)
Per Dr. Marko Plume, okay to tx with WBC 2.2

## 2014-05-23 NOTE — Progress Notes (Signed)
OFFICE PROGRESS NOTE   May 23, 2014   Physicians:Emma Barrington Ellison, MD (PCP), Luvenia Starch.Wert, P.Nahser, M.Stark/ J.Mann  INTERVAL HISTORY:   Patient is seen, together with significant other, in continuing attention to chemotherapy ongoing for primary peritoneal carcinoma. Plan is to complete additional 3 cycles of carboplatin following interval debulking done 03-29-14. She is due day 15 cycle 2 of this dose dense carbo taxol today. We are using granix days 2,3,9,16, the gCSF required even with weekly dosing and reduction in carboplatin, but overall she is tolerating the dose dense regimen much better than every 3 week dosing. She will have repeat scans and see Dr Denman George after cycle 3.  CA 125 on 05-16-14 was 8, this having been 77 in Feb prior to surgery. Note cirrhosis from NASH with splenomegaly and presumed portal HTN, cytopenias in part related.    Overall patient has felt gradually better, now able to drive and has returned to her part time work. She has been able to sleep well for past 2+ weeks using oxycodone 5 mg at hs, which is the first time in over a year that she has slept this well. She is adjusting miralax to keep bowels moving well. She has no increased in preexisting neuropathy in left foot, otherwise no significant peripheral neuropathy. No change in the firm area recently felt at surgical scar, no hernia identified by [place] on 05-09-14.   PAC in Flu vaccine done Genetics testing from 02-2014 normal ([employer]Next panel)  ONCOLOGIC HISTORY Patient initially had problems in ~ July 2015 with stress urinary incontinence related to severe chronic cough. Urine culture by PCP showed no infection and she was referred to Dr Clois Comber, who adjusted medications with great improvement in the cough. Despite improved cough, she continued with some urinary incontinence. She saw Dr Ronita Hipps in 10-2013, with pelvic mass on exam and CA 125 1195 on 11-08-13. She had CT AP 11-12-13, which showed large  ascites, irregular and shrunken liver, mild splenomegaly, increased soft tissue in pelvis bilaterally and anterior mid and lower peritoneum, small left pleural effusion. She saw Dr Denman George in consultation on 11-15-13, who agreed that this was likely gyn malignancy. 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 She had second paracentesis for 3.6 liters on 12-03-13. Gyn oncology recommended initial treatment with chemotherapy. She had first carboplatin and taxol on 12-07-13, with carboplatin dosed at AUC=4 and taxol at 135 mg/m2 due to concern for cirrhosis. She has needed gCSF support and even so was admitted with febrile neutropenia after cycle 3. CA125 was 1938 on 12-01-13 as baseline for chemotherapy, down to 130 prior to cycle 4 and 45 after cycle 5. 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 Chemo resumed 04-19-14. regimen changed to dose dense carbo taxol.    Review of systems as above, also: No fever or symptoms of infection. Less abdominal discomfort. No bleeding. Appetite fairly good. No SOB, no cough even with pollen now. Remainder of 10 point Review of Systems negative.  Objective:  Vital signs in last 24 hours:  BP 108/49 mmHg  Pulse 71  Temp(Src) 97.9 F (36.6 C) (Oral)  Resp 18  Ht 5' 1" (1.549 m)  Wt 181 lb 6 oz (82.271 kg)  BMI 34.29 kg/m2  SpO2 98%  Weight stable Alert, oriented and appropriate. Ambulatory without difficulty. Looks more comfortable. Alopecia  HEENT:PERRL, sclerae not icteric. Oral mucosa moist without lesions, posterior pharynx clear.  Neck supple. No JVD.  Lymphatics:no cervical,supraclavicular, or inguinal adenopathy Resp: clear to auscultation bilaterally  and normal percussion bilaterally Cardio: regular rate and rhythm. No gallop.No cough GI: abdomen somewhat full but not tight, nontender, no increased distension. Normally active bowel sounds. Surgical incision not tender, closed, area questioned may be deep suture. Musculoskeletal/ Extremities: without pitting edema, cords, tenderness Neuro: no change peripheral neuropathy. Otherwise nonfocal. PSYCH appropriate mood and affect Skin without rash, ecchymosis, petechiae  Portacath-without erythema or tenderness  Lab Results:  Results for orders placed or performed in visit on 05/23/14  CBC with Differential  Result Value Ref Range   WBC 2.2 (L) 3.9 - 10.3 10e3/uL   NEUT# 1.8 1.5 - 6.5 10e3/uL   HGB 10.3 (L) 11.6 - 15.9 g/dL   HCT 32.9 (L) 34.8 - 46.6 %   Platelets 123 (L) 145 - 400 10e3/uL   MCV 87.3 79.5 - 101.0 fL   MCH 27.3 25.1 - 34.0 pg   MCHC 31.3 (L) 31.5 - 36.0 g/dL   RBC 3.77 3.70 - 5.45 10e6/uL   RDW 16.7 (H) 11.2 - 14.5 %   lymph# 0.4 (L) 0.9 - 3.3 10e3/uL   MONO# 0.0 (L) 0.1 - 0.9 10e3/uL   Eosinophils Absolute 0.0 0.0 - 0.5 10e3/uL   Basophils Absolute 0.0 0.0 - 0.1 10e3/uL   NEUT% 80.4 (H) 38.4 - 76.8 %   LYMPH% 18.2 14.0 - 49.7 %   MONO% 1.4 0.0 - 14.0 %   EOS% 0.0 0.0 - 7.0 %   BASO% 0.0 0.0 - 2.0 %  Comprehensive metabolic panel (Cmet) - [facility]  Result Value Ref Range   Sodium 137 136 - 145 mEq/L   Potassium 4.8 3.5 - 5.1 mEq/L   Chloride 107 98 - 109 mEq/L   CO2 18 (L) 22 - 29 mEq/L   Glucose 349 (H) 70 - 140 mg/dl   BUN 17.0 7.0 - 26.0 mg/dL   Creatinine 0.9 0.6 - 1.1 mg/dL   Total Bilirubin 0.76 0.20 - 1.20 mg/dL   Alkaline Phosphatase 140 40 - 150 U/L   AST 28 5 - 34 U/L   ALT 49 0 - 55 U/L   Total Protein 7.0 6.4 - 8.3 g/dL   Albumin 3.7 3.5 - 5.0 g/dL   Calcium 8.5 8.4 - 10.4 mg/dL   Anion Gap 12 (H) 3 - 11 mEq/L   EGFR 67 (L) >90 ml/min/1.73 m2   CA 125 on 05-16-14   30, this having been 45 in Feb prior to surgery.  Studies/Results:  No results  found.  Medications: I have reviewed the patient's current medications. Continue hs regimen with oxycodone as restless legs have been so refractory to all other treatments attempted.   DISCUSSION: Patient is in agreement with continuing chemo as planned. If she stays on schedule, she should complete cycle 3 on 06-13-14. We will set up CTs shortly prior to visit back to Dr Denman George ~ late May.  Assessment/Plan:  1.high grade serous primary peritoneal carcinoma: Neoadjuvant chemotherapy with carboplatin and taxol 12-07-13 thru cycle 5 03-08-14 with neulasta. Interval optimal debulking of what was still large amount of disease, with residual TNTC <74m tumor areas. Resumed chemotherapy, regimen changed to weekly dose dense carbo taxol beginning 04-19-14 (carbo only day 1 cycle 1). Day 15 cycle 1 held with ANC 1.1 despite  granix. Day 15 cycle 2 today, granix days 2,3,9,16. Carbo skin test prior to those treatments.  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  Liver function no change with chemo.Known to Dr Lucio Edward. 3.PAC in 4.diabetes on oral agent, does not follow blood sugars regularly at home. Cover with SS with chemo treatments 5.stress urinary incontinence no worse 6.chronic cough: better with medications by Dr [name]. Past tobacco 7. 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 Tapered somewhat but intolerable hot flashes when lower than 1/2 tab daily  11.narrow angle glaucoma: following with ophthalmology 12.restless legs: severe, chronically interferes with sleep. Has not responded to multiple treatment attempts, but better with oxyIR as above.  13.chemo peripheral neuropathy: no worse since most recent treatment. 14.no change firm area palpable at ventral  incision, no evidence of hernia by [place] 05-09-14 no mention of ascites on that [place] report. May be suture related, follow until scans and next appointment with Dr Denman George.    All questions answered. Chemo and granix orders confirmed. Patient is in agreement with recommendations and plans above. TIme spent 25 min including >50% counseling and coordination of care.   LIVESAY,LENNIS P, MD   05/23/2014, 9:23 AM

## 2014-05-23 NOTE — Patient Instructions (Signed)
[facility] Discharge Instructions for Patients Receiving Chemotherapy  Today you received the following chemotherapy agents: Taxol  To help prevent nausea and vomiting after your treatment, we encourage you to take your nausea medication as prescribed by your physician.   If you develop nausea and vomiting that is not controlled by your nausea medication, call the clinic.   BELOW ARE SYMPTOMS THAT SHOULD BE REPORTED IMMEDIATELY:  *FEVER GREATER THAN 100.5 F  *CHILLS WITH OR WITHOUT FEVER  NAUSEA AND VOMITING THAT IS NOT CONTROLLED WITH YOUR NAUSEA MEDICATION  *UNUSUAL SHORTNESS OF BREATH  *UNUSUAL BRUISING OR BLEEDING  TENDERNESS IN MOUTH AND THROAT WITH OR WITHOUT PRESENCE OF ULCERS  *URINARY PROBLEMS  *BOWEL PROBLEMS  UNUSUAL RASH Items with * indicate a potential emergency and should be followed up as soon as possible.  Feel free to call the clinic you have any questions or concerns. The clinic phone number is [phone].  Please show the [place] at check-in to the Emergency Department and triage nurse.

## 2014-05-23 NOTE — Patient Instructions (Signed)

## 2014-05-24 ENCOUNTER — Ambulatory Visit (HOSPITAL_BASED_OUTPATIENT_CLINIC_OR_DEPARTMENT_OTHER): Payer: Medicare Other

## 2014-05-24 DIAGNOSIS — Z5189 Encounter for other specified aftercare: Secondary | ICD-10-CM

## 2014-05-24 DIAGNOSIS — C569 Malignant neoplasm of unspecified ovary: Secondary | ICD-10-CM | POA: Diagnosis present

## 2014-05-24 MED ORDER — TBO-FILGRASTIM 300 MCG/0.5ML ~~LOC~~ SOSY
300.0000 ug | PREFILLED_SYRINGE | Freq: Once | SUBCUTANEOUS | Status: AC
  Administered 2014-05-24: 300 ug via SUBCUTANEOUS
  Filled 2014-05-24: qty 0.5

## 2014-05-25 ENCOUNTER — Ambulatory Visit: Payer: Medicare Other

## 2014-05-27 ENCOUNTER — Other Ambulatory Visit: Payer: Self-pay | Admitting: Oncology

## 2014-05-30 ENCOUNTER — Other Ambulatory Visit (HOSPITAL_BASED_OUTPATIENT_CLINIC_OR_DEPARTMENT_OTHER): Payer: Medicare Other

## 2014-05-30 ENCOUNTER — Ambulatory Visit: Payer: Medicare Other

## 2014-05-30 ENCOUNTER — Ambulatory Visit (HOSPITAL_BASED_OUTPATIENT_CLINIC_OR_DEPARTMENT_OTHER): Payer: Medicare Other

## 2014-05-30 VITALS — BP 132/59 | HR 76 | Temp 97.8°F | Resp 18

## 2014-05-30 DIAGNOSIS — Z5111 Encounter for antineoplastic chemotherapy: Secondary | ICD-10-CM

## 2014-05-30 DIAGNOSIS — C569 Malignant neoplasm of unspecified ovary: Secondary | ICD-10-CM

## 2014-05-30 DIAGNOSIS — Z95828 Presence of other vascular implants and grafts: Secondary | ICD-10-CM

## 2014-05-30 LAB — COMPREHENSIVE METABOLIC PANEL (CC13)
ALT: 69 U/L — ABNORMAL HIGH (ref 0–55)
AST: 29 U/L (ref 5–34)
Albumin: 3.8 g/dL (ref 3.5–5.0)
Alkaline Phosphatase: 138 U/L (ref 40–150)
Anion Gap: 12 mEq/L — ABNORMAL HIGH (ref 3–11)
BUN: 13.8 mg/dL (ref 7.0–26.0)
CO2: 17 mEq/L — ABNORMAL LOW (ref 22–29)
Calcium: 8.4 mg/dL (ref 8.4–10.4)
Chloride: 106 mEq/L (ref 98–109)
Creatinine: 1 mg/dL (ref 0.6–1.1)
EGFR: 62 mL/min/{1.73_m2} — ABNORMAL LOW (ref >90)
Glucose: 423 mg/dl — ABNORMAL HIGH (ref 70–140)
Potassium: 4.3 mEq/L (ref 3.5–5.1)
Sodium: 135 mEq/L — ABNORMAL LOW (ref 136–145)
Total Bilirubin: 0.71 mg/dL (ref 0.20–1.20)
Total Protein: 6.8 g/dL (ref 6.4–8.3)

## 2014-05-30 LAB — CBC WITH DIFFERENTIAL/PLATELET
BASO%: 0.7 % (ref 0.0–2.0)
Basophils Absolute: 0 10*3/uL (ref 0.0–0.1)
EOS%: 0.1 % (ref 0.0–7.0)
Eosinophils Absolute: 0 10*3/uL (ref 0.0–0.5)
HCT: 31.1 % — ABNORMAL LOW (ref 34.8–46.6)
HGB: 9.8 g/dL — ABNORMAL LOW (ref 11.6–15.9)
LYMPH%: 15 % (ref 14.0–49.7)
MCH: 26.9 pg (ref 25.1–34.0)
MCHC: 31.5 g/dL (ref 31.5–36.0)
MCV: 85.2 fL (ref 79.5–101.0)
MONO#: 0 10*3/uL — ABNORMAL LOW (ref 0.1–0.9)
MONO%: 1.1 % (ref 0.0–14.0)
NEUT#: 3 10*3/uL (ref 1.5–6.5)
NEUT%: 83.1 % — ABNORMAL HIGH (ref 38.4–76.8)
Platelets: 137 10*3/uL — ABNORMAL LOW (ref 145–400)
RBC: 3.64 10*6/uL — ABNORMAL LOW (ref 3.70–5.45)
RDW: 19.1 % — ABNORMAL HIGH (ref 11.2–14.5)
WBC: 3.6 10*3/uL — ABNORMAL LOW (ref 3.9–10.3)
lymph#: 0.5 10*3/uL — ABNORMAL LOW (ref 0.9–3.3)

## 2014-05-30 MED ORDER — SODIUM CHLORIDE 0.9 % IJ SOLN
10.0000 mL | INTRAMUSCULAR | Status: DC | PRN
  Administered 2014-05-30: 10 mL via INTRAVENOUS
  Filled 2014-05-30: qty 10

## 2014-05-30 MED ORDER — FAMOTIDINE IN NACL 20-0.9 MG/50ML-% IV SOLN
INTRAVENOUS | Status: AC
  Filled 2014-05-30: qty 50

## 2014-05-30 MED ORDER — SODIUM CHLORIDE 0.9 % IJ SOLN
10.0000 mL | INTRAMUSCULAR | Status: DC | PRN
  Administered 2014-05-30: 10 mL
  Filled 2014-05-30: qty 10

## 2014-05-30 MED ORDER — CARBOPLATIN CHEMO INTRADERMAL TEST DOSE 100MCG/0.02ML
100.0000 ug | Freq: Once | INTRADERMAL | Status: AC
  Administered 2014-05-30: 100 ug via INTRADERMAL
  Filled 2014-05-30: qty 0.01

## 2014-05-30 MED ORDER — SODIUM CHLORIDE 0.9 % IV SOLN
331.1000 mg | Freq: Once | INTRAVENOUS | Status: AC
  Administered 2014-05-30: 330 mg via INTRAVENOUS
  Filled 2014-05-30: qty 33

## 2014-05-30 MED ORDER — HEPARIN SOD (PORK) LOCK FLUSH 100 UNIT/ML IV SOLN
500.0000 [IU] | Freq: Once | INTRAVENOUS | Status: AC | PRN
  Administered 2014-05-30: 500 [IU]
  Filled 2014-05-30: qty 5

## 2014-05-30 MED ORDER — SODIUM CHLORIDE 0.9 % IV SOLN
Freq: Once | INTRAVENOUS | Status: AC
  Administered 2014-05-30: 11:00:00 via INTRAVENOUS
  Filled 2014-05-30: qty 8

## 2014-05-30 MED ORDER — FAMOTIDINE IN NACL 20-0.9 MG/50ML-% IV SOLN
20.0000 mg | Freq: Once | INTRAVENOUS | Status: AC
  Administered 2014-05-30: 20 mg via INTRAVENOUS

## 2014-05-30 MED ORDER — SODIUM CHLORIDE 0.9 % IV SOLN
Freq: Once | INTRAVENOUS | Status: AC
  Administered 2014-05-30: 11:00:00 via INTRAVENOUS

## 2014-05-30 MED ORDER — PACLITAXEL CHEMO INJECTION 300 MG/50ML
80.0000 mg/m2 | Freq: Once | INTRAVENOUS | Status: AC
  Administered 2014-05-30: 150 mg via INTRAVENOUS
  Filled 2014-05-30: qty 25

## 2014-05-30 NOTE — Patient Instructions (Signed)
[facility] Discharge Instructions for Patients Receiving Chemotherapy  Today you received the following chemotherapy agents Taxol/Carboplatin To help prevent nausea and vomiting after your treatment, we encourage you to take your nausea medication as prescribed.   If you develop nausea and vomiting that is not controlled by your nausea medication, call the clinic.   BELOW ARE SYMPTOMS THAT SHOULD BE REPORTED IMMEDIATELY:  *FEVER GREATER THAN 100.5 F  *CHILLS WITH OR WITHOUT FEVER  NAUSEA AND VOMITING THAT IS NOT CONTROLLED WITH YOUR NAUSEA MEDICATION  *UNUSUAL SHORTNESS OF BREATH  *UNUSUAL BRUISING OR BLEEDING  TENDERNESS IN MOUTH AND THROAT WITH OR WITHOUT PRESENCE OF ULCERS  *URINARY PROBLEMS  *BOWEL PROBLEMS  UNUSUAL RASH Items with * indicate a potential emergency and should be followed up as soon as possible.  Feel free to call the clinic you have any questions or concerns. The clinic phone number is [phone].  Please show the CHEMO ALERT CARD at check-in to the Emergency Department and triage nurse.   

## 2014-05-30 NOTE — Patient Instructions (Signed)

## 2014-05-31 ENCOUNTER — Ambulatory Visit (HOSPITAL_BASED_OUTPATIENT_CLINIC_OR_DEPARTMENT_OTHER): Payer: Medicare Other

## 2014-05-31 VITALS — BP 136/59 | HR 67 | Temp 98.3°F

## 2014-05-31 DIAGNOSIS — Z5189 Encounter for other specified aftercare: Secondary | ICD-10-CM

## 2014-05-31 DIAGNOSIS — C569 Malignant neoplasm of unspecified ovary: Secondary | ICD-10-CM | POA: Diagnosis present

## 2014-05-31 MED ORDER — TBO-FILGRASTIM 300 MCG/0.5ML ~~LOC~~ SOSY
300.0000 ug | PREFILLED_SYRINGE | Freq: Once | SUBCUTANEOUS | Status: AC
Start: 2014-05-31 — End: 2014-05-31
  Administered 2014-05-31: 300 ug via SUBCUTANEOUS
  Filled 2014-05-31: qty 0.5

## 2014-06-01 ENCOUNTER — Ambulatory Visit (HOSPITAL_BASED_OUTPATIENT_CLINIC_OR_DEPARTMENT_OTHER): Payer: Medicare Other

## 2014-06-01 ENCOUNTER — Other Ambulatory Visit: Payer: Self-pay | Admitting: Oncology

## 2014-06-01 VITALS — BP 118/63 | HR 67 | Temp 98.3°F

## 2014-06-01 DIAGNOSIS — C569 Malignant neoplasm of unspecified ovary: Secondary | ICD-10-CM

## 2014-06-01 DIAGNOSIS — Z5189 Encounter for other specified aftercare: Secondary | ICD-10-CM

## 2014-06-01 DIAGNOSIS — C482 Malignant neoplasm of peritoneum, unspecified: Secondary | ICD-10-CM

## 2014-06-01 MED ORDER — TBO-FILGRASTIM 300 MCG/0.5ML ~~LOC~~ SOSY
300.0000 ug | PREFILLED_SYRINGE | Freq: Once | SUBCUTANEOUS | Status: AC
  Administered 2014-06-01: 300 ug via SUBCUTANEOUS
  Filled 2014-06-01: qty 0.5

## 2014-06-06 ENCOUNTER — Ambulatory Visit (HOSPITAL_BASED_OUTPATIENT_CLINIC_OR_DEPARTMENT_OTHER): Payer: Medicare Other

## 2014-06-06 ENCOUNTER — Other Ambulatory Visit (HOSPITAL_BASED_OUTPATIENT_CLINIC_OR_DEPARTMENT_OTHER): Payer: Medicare Other

## 2014-06-06 ENCOUNTER — Ambulatory Visit: Payer: Medicare Other

## 2014-06-06 ENCOUNTER — Encounter: Payer: Self-pay | Admitting: Oncology

## 2014-06-06 ENCOUNTER — Ambulatory Visit (HOSPITAL_BASED_OUTPATIENT_CLINIC_OR_DEPARTMENT_OTHER): Payer: Medicare Other | Admitting: Oncology

## 2014-06-06 VITALS — BP 144/57 | HR 81 | Temp 97.9°F | Resp 16 | Ht 61.0 in | Wt 179.2 lb

## 2014-06-06 DIAGNOSIS — K7581 Nonalcoholic steatohepatitis (NASH): Secondary | ICD-10-CM

## 2014-06-06 DIAGNOSIS — C786 Secondary malignant neoplasm of retroperitoneum and peritoneum: Secondary | ICD-10-CM | POA: Diagnosis not present

## 2014-06-06 DIAGNOSIS — G2581 Restless legs syndrome: Secondary | ICD-10-CM

## 2014-06-06 DIAGNOSIS — D6959 Other secondary thrombocytopenia: Secondary | ICD-10-CM | POA: Diagnosis not present

## 2014-06-06 DIAGNOSIS — C569 Malignant neoplasm of unspecified ovary: Secondary | ICD-10-CM

## 2014-06-06 DIAGNOSIS — T451X5A Adverse effect of antineoplastic and immunosuppressive drugs, initial encounter: Secondary | ICD-10-CM

## 2014-06-06 DIAGNOSIS — E669 Obesity, unspecified: Secondary | ICD-10-CM

## 2014-06-06 DIAGNOSIS — Z95828 Presence of other vascular implants and grafts: Secondary | ICD-10-CM

## 2014-06-06 DIAGNOSIS — G620 Drug-induced polyneuropathy: Secondary | ICD-10-CM | POA: Diagnosis not present

## 2014-06-06 DIAGNOSIS — C482 Malignant neoplasm of peritoneum, unspecified: Secondary | ICD-10-CM

## 2014-06-06 DIAGNOSIS — R161 Splenomegaly, not elsewhere classified: Secondary | ICD-10-CM

## 2014-06-06 DIAGNOSIS — E119 Type 2 diabetes mellitus without complications: Secondary | ICD-10-CM | POA: Diagnosis not present

## 2014-06-06 DIAGNOSIS — D701 Agranulocytosis secondary to cancer chemotherapy: Secondary | ICD-10-CM | POA: Diagnosis not present

## 2014-06-06 DIAGNOSIS — E1169 Type 2 diabetes mellitus with other specified complication: Secondary | ICD-10-CM

## 2014-06-06 DIAGNOSIS — K7460 Unspecified cirrhosis of liver: Secondary | ICD-10-CM

## 2014-06-06 LAB — COMPREHENSIVE METABOLIC PANEL (CC13)
ALT: 64 U/L — ABNORMAL HIGH (ref 0–55)
AST: 33 U/L (ref 5–34)
Albumin: 3.8 g/dL (ref 3.5–5.0)
Alkaline Phosphatase: 112 U/L (ref 40–150)
Anion Gap: 15 mEq/L — ABNORMAL HIGH (ref 3–11)
BUN: 15 mg/dL (ref 7.0–26.0)
CO2: 16 mEq/L — ABNORMAL LOW (ref 22–29)
Calcium: 7.9 mg/dL — ABNORMAL LOW (ref 8.4–10.4)
Chloride: 105 mEq/L (ref 98–109)
Creatinine: 0.8 mg/dL (ref 0.6–1.1)
EGFR: 76 mL/min/{1.73_m2} — ABNORMAL LOW (ref >90)
Glucose: 312 mg/dl — ABNORMAL HIGH (ref 70–140)
Potassium: 4.3 mEq/L (ref 3.5–5.1)
Sodium: 136 mEq/L (ref 136–145)
Total Bilirubin: 0.66 mg/dL (ref 0.20–1.20)
Total Protein: 6.7 g/dL (ref 6.4–8.3)

## 2014-06-06 LAB — CBC WITH DIFFERENTIAL/PLATELET
BASO%: 0.7 % (ref 0.0–2.0)
Basophils Absolute: 0 10*3/uL (ref 0.0–0.1)
EOS%: 0.1 % (ref 0.0–7.0)
Eosinophils Absolute: 0 10*3/uL (ref 0.0–0.5)
HCT: 29.9 % — ABNORMAL LOW (ref 34.8–46.6)
HGB: 9.4 g/dL — ABNORMAL LOW (ref 11.6–15.9)
LYMPH%: 29.1 % (ref 14.0–49.7)
MCH: 26.4 pg (ref 25.1–34.0)
MCHC: 31.6 g/dL (ref 31.5–36.0)
MCV: 83.4 fL (ref 79.5–101.0)
MONO#: 0 10*3/uL — ABNORMAL LOW (ref 0.1–0.9)
MONO%: 2 % (ref 0.0–14.0)
NEUT#: 0.9 10*3/uL — ABNORMAL LOW (ref 1.5–6.5)
NEUT%: 68.1 % (ref 38.4–76.8)
Platelets: 114 10*3/uL — ABNORMAL LOW (ref 145–400)
RBC: 3.58 10*6/uL — ABNORMAL LOW (ref 3.70–5.45)
RDW: 19.2 % — ABNORMAL HIGH (ref 11.2–14.5)
WBC: 1.3 10*3/uL — ABNORMAL LOW (ref 3.9–10.3)
lymph#: 0.4 10*3/uL — ABNORMAL LOW (ref 0.9–3.3)

## 2014-06-06 MED ORDER — OXYCODONE HCL 5 MG PO TABS: ORAL_TABLET | ORAL | Status: DC

## 2014-06-06 MED ORDER — TBO-FILGRASTIM 300 MCG/0.5ML ~~LOC~~ SOSY
300.0000 ug | PREFILLED_SYRINGE | Freq: Once | SUBCUTANEOUS | Status: AC
  Administered 2014-06-06: 300 ug via SUBCUTANEOUS
  Filled 2014-06-06: qty 0.5

## 2014-06-06 MED ORDER — SODIUM CHLORIDE 0.9 % IJ SOLN
10.0000 mL | INTRAMUSCULAR | Status: DC | PRN
  Administered 2014-06-06: 10 mL via INTRAVENOUS
  Filled 2014-06-06: qty 10

## 2014-06-06 NOTE — Patient Instructions (Signed)

## 2014-06-06 NOTE — Progress Notes (Signed)
OFFICE PROGRESS NOTE   June 06, 2014   Physicians:Emma Barrington Ellison, MD (PCP), Luvenia Starch.Wert, P.Nahser, M.Stark/ J.Mann  INTERVAL HISTORY:   Patient is seen, together with significant other, in ongoing follow up of chemotherapy for primary peritoneal carcinoma, due day 8 of third cycle since interval debulking 03-29-14. She had day 1 cycle 3 dose dense carbo taxol on 05-30-14, with granix on 4-19 and 4-20. She is neutropenic with ANC 0.9 today, so will receive additional granix instead of this chemotherapy. Plan is for repeat CTs after this cycle, on 07-04-14, and to see Dr Denman George on 07-06-14.  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   Patient has been fatigued since most recent treatment. She is somewhat SOB with exertion such as walking to parking lot, but not with activity in exam room. Taxol aches were manageable, nausea has been controlled and she is eating and drinking fluids, no fever or symptoms of infection. Only bleeding has been minimal from nares bilaterally when she blows nose.     PAC in Flu vaccine done Genetics testing from 02-2014 normal ([employer] panel)  ONCOLOGIC HISTORY Patient initially had problems in ~ July 2015 with stress urinary incontinence related to severe chronic cough. Urine culture by PCP showed no infection and she was referred to Dr Clois Comber, who adjusted medications with great improvement in the cough. Despite improved cough, she continued with some urinary incontinence. She saw Dr Ronita Hipps in 10-2013, with pelvic mass on exam and CA 125 1195 on 11-08-13. She had CT AP 11-12-13, which showed large ascites, irregular and shrunken liver, mild splenomegaly, increased soft tissue in pelvis bilaterally and anterior mid and lower peritoneum, small left pleural effusion. She saw Dr Denman George in consultation on 11-15-13, who agreed that this was likely gyn malignancy. She had US paracentesis of 4.1  liters on 11-18-13, with cytology ([id number]) and immunohistochemical stains consistent with serous carcinoma of Mullerian tract primary, favor ovarian. She had second paracentesis for 3.6 liters on 12-03-13. Gyn oncology recommended initial treatment with chemotherapy. She had first carboplatin and taxol on 12-07-13, with carboplatin dosed at AUC=4 and taxol at 135 mg/m2 due to concern for cirrhosis. She has needed gCSF support and even so was admitted with febrile neutropenia after cycle 3. CA125 was 1938 on 12-01-13 as baseline for chemotherapy, down to 130 prior to cycle 4 and 45 after cycle 5. 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 Chemo resumed 04-19-14. regimen changed to dose dense carbo taxol with granix days 2,3,9,16.      Review of systems as above, also: Restless legs much better with hs oxyIR, and she has still been able to sleep using this. She has also used oxycodone occasionally during day when restless legs very symptomatic. Bowels ok, no cough and environmental allergies symptomatic only with some clear nasal drainage. No worsening of peripheral neuropathy. No problems with PAC Remainder of 10 point Review of Systems negative.  Objective:  Vital signs in last 24 hours:  BP 144/57 mmHg  Pulse 81  Temp(Src) 97.9 F (36.6 C) (Oral)  Resp 16  Ht 5' 1" (1.549 m)  Wt 179 lb 4 oz (81.307 kg)  BMI 33.89 kg/m2  Alert, oriented and appropriate. Ambulatory without assistance. Pale but not icteric. Respirations not labored RA, no cough Complete alopecia  HEENT:PERRL, sclerae  not icteric. Oral mucosa moist without lesions, posterior pharynx clear. High nasal septum bilaterally a little irritated without frank bleeding. Neck supple. No JVD.  Lymphatics:no cervical,supraclavicular, or inguinal adenopathy Resp: clear  to auscultation bilaterally and normal percussion bilaterally Cardio: regular rate and rhythm. No gallop. GI: soft, nontender, not distended, no mass or organomegaly. Normally active bowel sounds. Surgical incision not remarkable, slightly firm area noted at last exam not as obvious. Musculoskeletal/ Extremities: without pitting edema, cords, tenderness Neuro: no increased peripheral neuropathy. Otherwise nonfocal. PSYCH appropriate mood and affect Skin without rash, ecchymosis, petechiae Portacath-without erythema or tenderness  Lab Results:  Results for orders placed or performed in visit on 06/06/14  CBC with Differential  Result Value Ref Range   WBC 1.3 (L) 3.9 - 10.3 10e3/uL   NEUT# 0.9 (L) 1.5 - 6.5 10e3/uL   HGB 9.4 (L) 11.6 - 15.9 g/dL   HCT 29.9 (L) 34.8 - 46.6 %   Platelets 114 (L) 145 - 400 10e3/uL   MCV 83.4 79.5 - 101.0 fL   MCH 26.4 25.1 - 34.0 pg   MCHC 31.6 31.5 - 36.0 g/dL   RBC 3.58 (L) 3.70 - 5.45 10e6/uL   RDW 19.2 (H) 11.2 - 14.5 %   lymph# 0.4 (L) 0.9 - 3.3 10e3/uL   MONO# 0.0 (L) 0.1 - 0.9 10e3/uL   Eosinophils Absolute 0.0 0.0 - 0.5 10e3/uL   Basophils Absolute 0.0 0.0 - 0.1 10e3/uL   NEUT% 68.1 38.4 - 76.8 %   LYMPH% 29.1 14.0 - 49.7 %   MONO% 2.0 0.0 - 14.0 %   EOS% 0.1 0.0 - 7.0 %   BASO% 0.7 0.0 - 2.0 %  Comprehensive metabolic panel (Cmet) - [facility]  Result Value Ref Range   Sodium 136 136 - 145 mEq/L   Potassium 4.3 3.5 - 5.1 mEq/L   Chloride 105 98 - 109 mEq/L   CO2 16 (L) 22 - 29 mEq/L   Glucose 312 (H) 70 - 140 mg/dl   BUN 15.0 7.0 - 26.0 mg/dL   Creatinine 0.8 0.6 - 1.1 mg/dL   Total Bilirubin 0.66 0.20 - 1.20 mg/dL   Alkaline Phosphatase 112 40 - 150 U/L   AST 33 5 - 34 U/L   ALT 64 (H) 0 - 55 U/L   Total Protein 6.7 6.4 - 8.3 g/dL   Albumin 3.8 3.5 - 5.0 g/dL   Calcium 7.9 (L) 8.4 - 10.4 mg/dL   Anion Gap 15 (H) 3 - 11 mEq/L   EGFR 76 (L) >90 ml/min/1.73 m2     Studies/Results:  No results found.  Medications: I have reviewed  the patient's current medications. OK to continue prn oxycodone, which has been the only intervention that has actually helped severe restless legs. Hold chemo with neutropenia today, granix today and 4-26. Add saline nose spray several times daily  DISCUSSION: Meds as above. Reviewed neutropenic precautions. WIll give day 15 cycle 3 next week as long as ANC >=1.4 and plt >=100k. Patient to speak with RN on Friday 4-29: if still very fatigued then may need to repeat CBC/ possible additional granix and would also hold day 15 if she does not feel able to take. She will need ~ 1 unit PRBCs if hgb <9.0 if more symptomatic. Will keep CTs as scheduled 5-23 and gyn oncology visit 5-25 as planned.  Assessment/Plan:  1.high grade serous primary peritoneal carcinoma: Neoadjuvant chemotherapy with carboplatin and taxol 12-07-13 thru cycle 5 03-08-14 with neulasta. Interval optimal debulking  of what was still large amount of disease, with residual TNTC <27m tumor areas. Resumed chemotherapy, regimen changed to weekly dose dense carbo taxol beginning 04-19-14 (carbo only day 1 cycle 1). Day 15 cycle 1 held with ANC 1.1 despite granix. Day 8 cycle 3 held with neutropenia today, granix increased. Carbo skin test prior to those treatments. WIll give day 15 cycle 3 next week if possible, then repeat scans and to see Dr RDenman Georgein follow up 2.hepatic cirrhosis, with mild splenomegaly/ suggestion of portal HTN: likely NASH. Liver function no change with chemo.Known to Dr MLucio Edward 3.PAC in 4.diabetes on oral agent, does not follow blood sugars regularly at home. Cover with SS with chemo treatments 5.stress urinary incontinence no worse 6.chronic cough: essentially resolved with medications by Dr WMelvyn Novas Past tobacco 7. 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  8,post TAH 1988 for benign indications 9.nonSTEMI 2012 unremarkable cath then, HTN, known to Dr  NAcie Fredrickson10.chronic Estrace x years for hot flashes. Tapered somewhat but intolerable hot flashes when lower than 1/2 tab daily  11.narrow angle glaucoma: following with ophthalmology 12.restless legs: severe, chronically interferes with sleep. Has not responded to multiple treatment attempts, but better with oxyIR as above.  13.chemo peripheral neuropathy: no worse since most recent treatment.  All questions answered. Patient knows to call if needed prior to next appointments. Message to RN to speak with her by phone 4-29. Chemo orders adjusted, granix added. Time spent 25 min including >50% counseling and coordination of care.   LIVESAY,LENNIS P, MD   06/06/2014, 9:55 AM

## 2014-06-07 ENCOUNTER — Ambulatory Visit (HOSPITAL_BASED_OUTPATIENT_CLINIC_OR_DEPARTMENT_OTHER): Payer: Medicare Other

## 2014-06-07 VITALS — BP 138/66 | HR 67 | Temp 98.2°F

## 2014-06-07 DIAGNOSIS — D701 Agranulocytosis secondary to cancer chemotherapy: Secondary | ICD-10-CM

## 2014-06-07 DIAGNOSIS — C569 Malignant neoplasm of unspecified ovary: Secondary | ICD-10-CM

## 2014-06-07 MED ORDER — TBO-FILGRASTIM 300 MCG/0.5ML ~~LOC~~ SOSY
300.0000 ug | PREFILLED_SYRINGE | Freq: Once | SUBCUTANEOUS | Status: AC
  Administered 2014-06-07: 300 ug via SUBCUTANEOUS
  Filled 2014-06-07: qty 0.5

## 2014-06-08 ENCOUNTER — Ambulatory Visit: Payer: Medicare Other | Admitting: Gynecologic Oncology

## 2014-06-10 ENCOUNTER — Other Ambulatory Visit (HOSPITAL_BASED_OUTPATIENT_CLINIC_OR_DEPARTMENT_OTHER): Payer: Medicare Other

## 2014-06-10 ENCOUNTER — Ambulatory Visit (HOSPITAL_COMMUNITY)
Admission: RE | Admit: 2014-06-10 | Discharge: 2014-06-10 | Disposition: A | Discharge status: Home / Self Care | Payer: Medicare Other | Source: Ambulatory Visit | Attending: Oncology | Admitting: Oncology

## 2014-06-10 ENCOUNTER — Telehealth: Payer: Self-pay

## 2014-06-10 ENCOUNTER — Other Ambulatory Visit: Payer: Medicare Other

## 2014-06-10 ENCOUNTER — Telehealth: Payer: Self-pay | Admitting: Oncology

## 2014-06-10 ENCOUNTER — Other Ambulatory Visit: Payer: Self-pay | Admitting: Oncology

## 2014-06-10 DIAGNOSIS — C569 Malignant neoplasm of unspecified ovary: Secondary | ICD-10-CM | POA: Diagnosis present

## 2014-06-10 DIAGNOSIS — C482 Malignant neoplasm of peritoneum, unspecified: Secondary | ICD-10-CM

## 2014-06-10 DIAGNOSIS — C786 Secondary malignant neoplasm of retroperitoneum and peritoneum: Secondary | ICD-10-CM | POA: Diagnosis not present

## 2014-06-10 DIAGNOSIS — T451X5A Adverse effect of antineoplastic and immunosuppressive drugs, initial encounter: Secondary | ICD-10-CM | POA: Diagnosis not present

## 2014-06-10 DIAGNOSIS — D6481 Anemia due to antineoplastic chemotherapy: Secondary | ICD-10-CM | POA: Diagnosis present

## 2014-06-10 LAB — COMPREHENSIVE METABOLIC PANEL (CC13)
ALT: 49 U/L (ref 0–55)
AST: 28 U/L (ref 5–34)
Albumin: 3.5 g/dL (ref 3.5–5.0)
Alkaline Phosphatase: 123 U/L (ref 40–150)
Anion Gap: 10 mEq/L (ref 3–11)
BUN: 11.7 mg/dL (ref 7.0–26.0)
CO2: 24 mEq/L (ref 22–29)
Calcium: 7.2 mg/dL — ABNORMAL LOW (ref 8.4–10.4)
Chloride: 107 mEq/L (ref 98–109)
Creatinine: 0.8 mg/dL (ref 0.6–1.1)
EGFR: 76 mL/min/{1.73_m2} — ABNORMAL LOW (ref >90)
Glucose: 143 mg/dl — ABNORMAL HIGH (ref 70–140)
Potassium: 3.4 mEq/L — ABNORMAL LOW (ref 3.5–5.1)
Sodium: 141 mEq/L (ref 136–145)
Total Bilirubin: 0.64 mg/dL (ref 0.20–1.20)
Total Protein: 5.9 g/dL — ABNORMAL LOW (ref 6.4–8.3)

## 2014-06-10 LAB — CBC WITH DIFFERENTIAL/PLATELET
BASO%: 0.5 % (ref 0.0–2.0)
Basophils Absolute: 0 10*3/uL (ref 0.0–0.1)
EOS%: 0.9 % (ref 0.0–7.0)
Eosinophils Absolute: 0 10*3/uL (ref 0.0–0.5)
HCT: 27.1 % — ABNORMAL LOW (ref 34.8–46.6)
HGB: 8.4 g/dL — ABNORMAL LOW (ref 11.6–15.9)
LYMPH%: 31.5 % (ref 14.0–49.7)
MCH: 26.6 pg (ref 25.1–34.0)
MCHC: 31 g/dL — ABNORMAL LOW (ref 31.5–36.0)
MCV: 85.8 fL (ref 79.5–101.0)
MONO#: 0.7 10*3/uL (ref 0.1–0.9)
MONO%: 15.8 % — ABNORMAL HIGH (ref 0.0–14.0)
NEUT#: 2.3 10*3/uL (ref 1.5–6.5)
NEUT%: 51.3 % (ref 38.4–76.8)
Platelets: 102 10*3/uL — ABNORMAL LOW (ref 145–400)
RBC: 3.16 10*6/uL — ABNORMAL LOW (ref 3.70–5.45)
RDW: 18.1 % — ABNORMAL HIGH (ref 11.2–14.5)
WBC: 4.4 10*3/uL (ref 3.9–10.3)
lymph#: 1.4 10*3/uL (ref 0.9–3.3)

## 2014-06-10 NOTE — Telephone Encounter (Signed)
Per 4/29 pof cxd taxol 5/2 and transfuse 1 unit of blood. Comment in 5/2 infusion not from tx to blood already made per desk nurse. Appointment time remains the same.

## 2014-06-10 NOTE — Telephone Encounter (Signed)
Ms. Leda Min ANC today is 2.3; plts = 102K; and HGB=8.4.  Ms. Sherlynn Stalls states she is SOB with exertion.  Spoke with Dr. Marko Plume and with platlet count lower at 102 she will cancell chemotherapy and give 1 unit of PRBC.   Ms. Sherlynn Stalls instructed  To go to the ED if the SOB increases prior to Monday.  Pt. Verbalized understanding. Cancelled grainx injection for 06-14-14 as pt. not receiving chemotherapy 06-13-14.

## 2014-06-10 NOTE — Telephone Encounter (Addendum)
Ms. Sherlynn Stalls states that she has been achy and tired after granix injections 4-25 and 4-26. She went to work yesterday for a while and is there today.  She has a little more energy today but not much.  She feels she is able to take her chemotherapy on Monday 06-13-14.  Will get lab work to day to see if Counts better for treatment on 06-13-14.  Lab appointment at 1315 today.  Patient Demographics     Patient Name Sex DOB SSN Address Phone    Rodnisha, Blomgren Female Nov 16, 1945 [id number] [place] [address] [phone] (Home) [phone] (Mobile) *Preferred*      Message  Received: 4 days ago    Gordy Levan, MD  Baruch Merl, RN; Christa See, RN           RN please speak with patient on Friday 4-29 AM  May not feel up to last planned taxol on Mon 5-2  May need another CBC Friday   thanks

## 2014-06-12 LAB — CA 125: CA 125: 19 U/mL (ref <35)

## 2014-06-13 ENCOUNTER — Ambulatory Visit (HOSPITAL_BASED_OUTPATIENT_CLINIC_OR_DEPARTMENT_OTHER): Payer: Medicare Other

## 2014-06-13 ENCOUNTER — Other Ambulatory Visit (HOSPITAL_COMMUNITY)
Admission: AD | Admit: 2014-06-13 | Discharge: 2014-06-13 | Disposition: A | Discharge status: Home / Self Care | Payer: Medicare Other | Source: Ambulatory Visit | Attending: Oncology | Admitting: Oncology

## 2014-06-13 ENCOUNTER — Ambulatory Visit: Payer: Medicare Other

## 2014-06-13 ENCOUNTER — Other Ambulatory Visit (HOSPITAL_BASED_OUTPATIENT_CLINIC_OR_DEPARTMENT_OTHER): Payer: Medicare Other

## 2014-06-13 ENCOUNTER — Other Ambulatory Visit (HOSPITAL_COMMUNITY)
Admission: RE | Admit: 2014-06-13 | Discharge: 2014-06-13 | Disposition: A | Discharge status: Home / Self Care | Payer: Medicare Other | Source: Ambulatory Visit | Attending: Oncology | Admitting: Oncology

## 2014-06-13 ENCOUNTER — Ambulatory Visit (HOSPITAL_COMMUNITY)
Admission: RE | Admit: 2014-06-13 | Discharge: 2014-06-13 | Disposition: A | Discharge status: Home / Self Care | Payer: Medicare Other | Source: Ambulatory Visit | Attending: Oncology | Admitting: Oncology

## 2014-06-13 VITALS — BP 114/60 | HR 60 | Temp 97.7°F | Resp 16

## 2014-06-13 DIAGNOSIS — D6481 Anemia due to antineoplastic chemotherapy: Secondary | ICD-10-CM

## 2014-06-13 DIAGNOSIS — Z95828 Presence of other vascular implants and grafts: Secondary | ICD-10-CM

## 2014-06-13 DIAGNOSIS — T451X5A Adverse effect of antineoplastic and immunosuppressive drugs, initial encounter: Secondary | ICD-10-CM

## 2014-06-13 DIAGNOSIS — C569 Malignant neoplasm of unspecified ovary: Secondary | ICD-10-CM | POA: Diagnosis present

## 2014-06-13 LAB — CBC WITH DIFFERENTIAL/PLATELET
BASO%: 0.8 % (ref 0.0–2.0)
Basophils Absolute: 0 10*3/uL (ref 0.0–0.1)
EOS%: 1.2 % (ref 0.0–7.0)
Eosinophils Absolute: 0 10*3/uL (ref 0.0–0.5)
HCT: 28.4 % — ABNORMAL LOW (ref 34.8–46.6)
HGB: 8.7 g/dL — ABNORMAL LOW (ref 11.6–15.9)
LYMPH%: 29.8 % (ref 14.0–49.7)
MCH: 26 pg (ref 25.1–34.0)
MCHC: 30.8 g/dL — ABNORMAL LOW (ref 31.5–36.0)
MCV: 84.4 fL (ref 79.5–101.0)
MONO#: 0.3 10*3/uL (ref 0.1–0.9)
MONO%: 13.4 % (ref 0.0–14.0)
NEUT#: 1.4 10*3/uL — ABNORMAL LOW (ref 1.5–6.5)
NEUT%: 54.8 % (ref 38.4–76.8)
Platelets: 83 10*3/uL — ABNORMAL LOW (ref 145–400)
RBC: 3.36 10*6/uL — ABNORMAL LOW (ref 3.70–5.45)
RDW: 20 % — ABNORMAL HIGH (ref 11.2–14.5)
WBC: 2.6 10*3/uL — ABNORMAL LOW (ref 3.9–10.3)
lymph#: 0.8 10*3/uL — ABNORMAL LOW (ref 0.9–3.3)

## 2014-06-13 LAB — PREPARE RBC (CROSSMATCH)

## 2014-06-13 MED ORDER — ACETAMINOPHEN 325 MG PO TABS
650.0000 mg | ORAL_TABLET | Freq: Once | ORAL | Status: AC
  Administered 2014-06-13: 650 mg via ORAL

## 2014-06-13 MED ORDER — SODIUM CHLORIDE 0.9 % IJ SOLN
10.0000 mL | INTRAMUSCULAR | Status: DC | PRN
  Administered 2014-06-13: 10 mL via INTRAVENOUS
  Filled 2014-06-13: qty 10

## 2014-06-13 MED ORDER — ACETAMINOPHEN 325 MG PO TABS
ORAL_TABLET | ORAL | Status: AC
  Filled 2014-06-13: qty 2

## 2014-06-13 MED ORDER — SODIUM CHLORIDE 0.9 % IJ SOLN
10.0000 mL | INTRAMUSCULAR | Status: AC | PRN
  Administered 2014-06-13: 10 mL
  Filled 2014-06-13: qty 10

## 2014-06-13 MED ORDER — HEPARIN SOD (PORK) LOCK FLUSH 100 UNIT/ML IV SOLN
500.0000 [IU] | Freq: Every day | INTRAVENOUS | Status: AC | PRN
  Administered 2014-06-13: 500 [IU]
  Filled 2014-06-13: qty 5

## 2014-06-13 MED ORDER — SODIUM CHLORIDE 0.9 % IV SOLN
250.0000 mL | Freq: Once | INTRAVENOUS | Status: AC
  Administered 2014-06-13: 250 mL via INTRAVENOUS

## 2014-06-13 NOTE — Patient Instructions (Signed)

## 2014-06-13 NOTE — Patient Instructions (Signed)

## 2014-06-14 ENCOUNTER — Ambulatory Visit: Payer: Medicare Other

## 2014-06-14 LAB — TYPE AND SCREEN
ABO/RH(D): O NEG
Antibody Screen: NEGATIVE
Unit division: 0

## 2014-06-28 ENCOUNTER — Other Ambulatory Visit: Payer: Self-pay | Admitting: Oncology

## 2014-06-28 DIAGNOSIS — D500 Iron deficiency anemia secondary to blood loss (chronic): Secondary | ICD-10-CM

## 2014-06-30 ENCOUNTER — Ambulatory Visit (HOSPITAL_BASED_OUTPATIENT_CLINIC_OR_DEPARTMENT_OTHER): Payer: Medicare Other | Admitting: Oncology

## 2014-06-30 ENCOUNTER — Other Ambulatory Visit: Payer: Self-pay | Admitting: *Deleted

## 2014-06-30 ENCOUNTER — Telehealth: Payer: Self-pay | Admitting: Oncology

## 2014-06-30 ENCOUNTER — Ambulatory Visit (HOSPITAL_BASED_OUTPATIENT_CLINIC_OR_DEPARTMENT_OTHER): Payer: Medicare Other

## 2014-06-30 ENCOUNTER — Other Ambulatory Visit (HOSPITAL_BASED_OUTPATIENT_CLINIC_OR_DEPARTMENT_OTHER): Payer: Medicare Other

## 2014-06-30 ENCOUNTER — Encounter: Payer: Self-pay | Admitting: Oncology

## 2014-06-30 ENCOUNTER — Other Ambulatory Visit: Payer: Medicare Other

## 2014-06-30 VITALS — BP 140/62 | HR 64 | Temp 97.7°F | Resp 18 | Ht 61.0 in | Wt 187.5 lb

## 2014-06-30 DIAGNOSIS — C786 Secondary malignant neoplasm of retroperitoneum and peritoneum: Secondary | ICD-10-CM | POA: Diagnosis not present

## 2014-06-30 DIAGNOSIS — K7581 Nonalcoholic steatohepatitis (NASH): Secondary | ICD-10-CM

## 2014-06-30 DIAGNOSIS — T451X5A Adverse effect of antineoplastic and immunosuppressive drugs, initial encounter: Secondary | ICD-10-CM

## 2014-06-30 DIAGNOSIS — D6481 Anemia due to antineoplastic chemotherapy: Secondary | ICD-10-CM

## 2014-06-30 DIAGNOSIS — Z95828 Presence of other vascular implants and grafts: Secondary | ICD-10-CM

## 2014-06-30 DIAGNOSIS — C569 Malignant neoplasm of unspecified ovary: Secondary | ICD-10-CM | POA: Diagnosis present

## 2014-06-30 DIAGNOSIS — D6959 Other secondary thrombocytopenia: Secondary | ICD-10-CM

## 2014-06-30 DIAGNOSIS — G2581 Restless legs syndrome: Secondary | ICD-10-CM | POA: Diagnosis not present

## 2014-06-30 DIAGNOSIS — D701 Agranulocytosis secondary to cancer chemotherapy: Secondary | ICD-10-CM

## 2014-06-30 DIAGNOSIS — D500 Iron deficiency anemia secondary to blood loss (chronic): Secondary | ICD-10-CM

## 2014-06-30 DIAGNOSIS — N951 Menopausal and female climacteric states: Secondary | ICD-10-CM

## 2014-06-30 DIAGNOSIS — R161 Splenomegaly, not elsewhere classified: Secondary | ICD-10-CM

## 2014-06-30 DIAGNOSIS — K7460 Unspecified cirrhosis of liver: Secondary | ICD-10-CM

## 2014-06-30 DIAGNOSIS — E119 Type 2 diabetes mellitus without complications: Secondary | ICD-10-CM

## 2014-06-30 DIAGNOSIS — C482 Malignant neoplasm of peritoneum, unspecified: Secondary | ICD-10-CM

## 2014-06-30 LAB — COMPREHENSIVE METABOLIC PANEL (CC13)
ALT: 53 U/L (ref 0–55)
AST: 44 U/L — ABNORMAL HIGH (ref 5–34)
Albumin: 3.6 g/dL (ref 3.5–5.0)
Alkaline Phosphatase: 136 U/L (ref 40–150)
Anion Gap: 11 mEq/L (ref 3–11)
BUN: 10.1 mg/dL (ref 7.0–26.0)
CO2: 21 mEq/L — ABNORMAL LOW (ref 22–29)
Calcium: 8.5 mg/dL (ref 8.4–10.4)
Chloride: 110 mEq/L — ABNORMAL HIGH (ref 98–109)
Creatinine: 0.8 mg/dL (ref 0.6–1.1)
EGFR: 79 mL/min/{1.73_m2} — ABNORMAL LOW (ref >90)
Glucose: 168 mg/dl — ABNORMAL HIGH (ref 70–140)
Potassium: 4 mEq/L (ref 3.5–5.1)
Sodium: 142 mEq/L (ref 136–145)
Total Bilirubin: 0.54 mg/dL (ref 0.20–1.20)
Total Protein: 6.6 g/dL (ref 6.4–8.3)

## 2014-06-30 LAB — CBC WITH DIFFERENTIAL/PLATELET
BASO%: 0.3 % (ref 0.0–2.0)
Basophils Absolute: 0 10*3/uL (ref 0.0–0.1)
EOS%: 7.4 % — ABNORMAL HIGH (ref 0.0–7.0)
Eosinophils Absolute: 0.3 10*3/uL (ref 0.0–0.5)
HCT: 33.9 % — ABNORMAL LOW (ref 34.8–46.6)
HGB: 10.6 g/dL — ABNORMAL LOW (ref 11.6–15.9)
LYMPH%: 28.4 % (ref 14.0–49.7)
MCH: 26.4 pg (ref 25.1–34.0)
MCHC: 31.3 g/dL — ABNORMAL LOW (ref 31.5–36.0)
MCV: 84.5 fL (ref 79.5–101.0)
MONO#: 0.2 10*3/uL (ref 0.1–0.9)
MONO%: 6.6 % (ref 0.0–14.0)
NEUT#: 2.1 10*3/uL (ref 1.5–6.5)
NEUT%: 57.3 % (ref 38.4–76.8)
Platelets: 120 10*3/uL — ABNORMAL LOW (ref 145–400)
RBC: 4.01 10*6/uL (ref 3.70–5.45)
RDW: 18.9 % — ABNORMAL HIGH (ref 11.2–14.5)
WBC: 3.7 10*3/uL — ABNORMAL LOW (ref 3.9–10.3)
lymph#: 1 10*3/uL (ref 0.9–3.3)

## 2014-06-30 LAB — IRON AND TIBC CHCC
%SAT: 10 % — ABNORMAL LOW (ref 21–57)
Iron: 46 ug/dL (ref 41–142)
TIBC: 438 ug/dL (ref 236–444)
UIBC: 392 ug/dL — ABNORMAL HIGH (ref 120–384)

## 2014-06-30 LAB — FERRITIN CHCC: Ferritin: 20 ng/ml (ref 9–269)

## 2014-06-30 MED ORDER — SODIUM CHLORIDE 0.9 % IJ SOLN
10.0000 mL | INTRAMUSCULAR | Status: DC | PRN
  Administered 2014-06-30: 10 mL via INTRAVENOUS
  Filled 2014-06-30: qty 10

## 2014-06-30 MED ORDER — HEPARIN SOD (PORK) LOCK FLUSH 100 UNIT/ML IV SOLN
500.0000 [IU] | Freq: Once | INTRAVENOUS | Status: AC
  Administered 2014-06-30: 500 [IU] via INTRAVENOUS
  Filled 2014-06-30: qty 5

## 2014-06-30 MED ORDER — OXYCODONE HCL 5 MG PO TABS: ORAL_TABLET | ORAL | Status: DC

## 2014-06-30 NOTE — Patient Instructions (Signed)

## 2014-06-30 NOTE — Telephone Encounter (Signed)
Gave avs & calendar for July °

## 2014-06-30 NOTE — Progress Notes (Signed)
OFFICE PROGRESS NOTE   Jun 30, 2014   Physicians:Emma Barrington Ellison, MD (PCP), Luvenia Starch.Wert, P.Nahser, M.Stark/ J.Mann  INTERVAL HISTORY:   Patient is seen, together with significant other, in continuing attention to chemotherapy for primary peritoneal carcinoma. Present chemo has been more difficult for her to tolerate, with neutropenia despite gCSF and anemia requiring PRBCs on 06-13-14. Last chemo was day 1 dose dense carbo taxol on 05-30-14, this the third cycle since interval debulking surgery done 03-29-14; days 8 and 15 taxol were held due to low counts. Cirrhosis secondary to NASH and related splenomegaly have contributed to low counts thru chemo. Plan is for repeat CT on 07-04-14 prior to seeing Dr Denman George on 07-06-14.   Patient is still very fatigued, tho some better since PRBCs. Restless legs are severe, even during day recently, occasional oxyIR more helpful than multiple other meds tried for this problem. She is eating and bowels are moving. She is SOB with much exertion, but not walking in office. Bowels are moving. She has no abdominal or pelvic pain. Peripheral neuropathy in feet is stable, this preceded chemo.   PAC in, should be used for upcoming CT.  Flu vaccine done Genetics testing from 02-2014 normal ([employer] panel) CA 125  1195 at diagnosis. ER + on cytology 11-18-13  ONCOLOGIC HISTORY Patient initially had problems in ~ July 2015 with stress urinary incontinence related to severe chronic cough. Urine culture by PCP showed no infection and she was referred to Dr Clois Comber, who adjusted medications with great improvement in the cough. Despite improved cough, she continued with some urinary incontinence. She saw Dr Ronita Hipps in 10-2013, with pelvic mass on exam and CA 125 1195 on 11-08-13. She had CT AP 11-12-13, which showed large ascites, irregular and shrunken liver, mild splenomegaly, increased soft tissue in pelvis bilaterally and anterior mid and lower peritoneum, small  left pleural effusion. She saw Dr Denman George in consultation on 11-15-13, who agreed that this was likely gyn malignancy. She had US paracentesis of 4.1 liters on 11-18-13, with cytology ([id number]) and immunohistochemical stains consistent with serous carcinoma of Mullerian tract primary, favor ovarian, ER patchy positive.  She had second paracentesis for 3.6 liters on 12-03-13. Gyn oncology recommended initial treatment with chemotherapy. She had first carboplatin and taxol on 12-07-13, with carboplatin dosed at AUC=4 and taxol at 135 mg/m2 due to concern for cirrhosis. She has needed gCSF support and even so was admitted with febrile neutropenia after cycle 3. CA125 was 1938 on 12-01-13 as baseline for chemotherapy, down to 130 prior to cycle 4 and 45 after cycle 5. 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 Chemo resumed 04-19-14, regimen changed to dose dense carbo taxol with granix days 2,3,9,16. She tolerated the dose dense regimen better overall, tho cycle 3 days 8 and 15 were held with neutropenia, symptomatic anemia and fatigue.      Review of systems as above, also: No fever or symptoms of infection. No LE swelling. No bleeding. Bladder ok. No problems with PAC. No cough. Not checking blood sugars at home, note nonfasting 168 today. Remainder of 10 point Review of Systems negative.  Objective:  Vital signs in last 24 hours:  BP 140/62 mmHg  Pulse 64  Temp(Src) 97.7 F (36.5 C) (Oral)  Resp 18  Ht 5' 1"  (1.549 m)  Wt 187 lb 8 oz (85.049  kg)  BMI 35.45 kg/m2  SpO2 100% Weight up 8 lbs. Moves legs restlessly at times during visit. Alert, oriented and appropriate. Ambulatory without assistance.  Alopecia  HEENT:PERRL, sclerae not icteric. Oral mucosa moist without lesions, posterior pharynx clear.  Neck supple. No JVD.   Lymphatics:no cervical,supraclavicular or inguinal adenopathy Resp: clear to auscultation bilaterally and normal percussion bilaterally Cardio: regular rate and rhythm. No gallop. GI: abdomen obese, soft, nontender, not distended, no mass or organomegaly appreciated. Normally active bowel sounds. Surgical incision not remarkable. Musculoskeletal/ Extremities: without pitting edema, cords, tenderness Neuro: no peripheral neuropathy. Otherwise nonfocal Skin without rash, ecchymosis, petechiae Portacath-without erythema or tenderness  Lab Results:  Results for orders placed or performed in visit on 06/30/14  CBC with Differential  Result Value Ref Range   WBC 3.7 (L) 3.9 - 10.3 10e3/uL   NEUT# 2.1 1.5 - 6.5 10e3/uL   HGB 10.6 (L) 11.6 - 15.9 g/dL   HCT 33.9 (L) 34.8 - 46.6 %   Platelets 120 (L) 145 - 400 10e3/uL   MCV 84.5 79.5 - 101.0 fL   MCH 26.4 25.1 - 34.0 pg   MCHC 31.3 (L) 31.5 - 36.0 g/dL   RBC 4.01 3.70 - 5.45 10e6/uL   RDW 18.9 (H) 11.2 - 14.5 %   lymph# 1.0 0.9 - 3.3 10e3/uL   MONO# 0.2 0.1 - 0.9 10e3/uL   Eosinophils Absolute 0.3 0.0 - 0.5 10e3/uL   Basophils Absolute 0.0 0.0 - 0.1 10e3/uL   NEUT% 57.3 38.4 - 76.8 %   LYMPH% 28.4 14.0 - 49.7 %   MONO% 6.6 0.0 - 14.0 %   EOS% 7.4 (H) 0.0 - 7.0 %   BASO% 0.3 0.0 - 2.0 %  Comprehensive metabolic panel (Cmet) - [facility]  Result Value Ref Range   Sodium 142 136 - 145 mEq/L   Potassium 4.0 3.5 - 5.1 mEq/L   Chloride 110 (H) 98 - 109 mEq/L   CO2 21 (L) 22 - 29 mEq/L   Glucose 168 (H) 70 - 140 mg/dl   BUN 10.1 7.0 - 26.0 mg/dL   Creatinine 0.8 0.6 - 1.1 mg/dL   Total Bilirubin 0.54 0.20 - 1.20 mg/dL   Alkaline Phosphatase 136 40 - 150 U/L   AST 44 (H) 5 - 34 U/L   ALT 53 0 - 55 U/L   Total Protein 6.6 6.4 - 8.3 g/dL   Albumin 3.6 3.5 - 5.0 g/dL   Calcium 8.5 8.4 - 10.4 mg/dL   Anion Gap 11 3 - 11 mEq/L   EGFR 79 (L) >90 ml/min/1.73 m2  Iron and TIBC [facility]  Result Value Ref Range   Iron 46 41 - 142 ug/dL   TIBC 438  236 - 444 ug/dL   UIBC 392 (H) 120 - 384 ug/dL   %SAT 10 (L) 21 - 57 %  Ferritin  Result Value Ref Range   Ferritin 20 9 - 269 ng/ml    CA 125 on 06-10-14   19,  Compared with  45 pre-op in Feb.  Studies/Results:  No results found.  Medications: I have reviewed the patient's current medications.  DISCUSSION: patient understands that she may need further treatment depending on results of upcoming scans, but at least short break now hopefully will let her improve counts and overall status. If she does need more treatment now, hopefully we can find a more gentle regimen that she can tolerate (single agent carbo, single agent doxil, possibly hormone blocker at some point if needed -  I did not discuss any of these with her now).  Assessment/Plan:  1.high grade serous primary peritoneal carcinoma: Neoadjuvant chemotherapy with carboplatin and taxol 12-07-13 thru cycle 5 03-08-14 with neulasta. Interval optimal debulking of what was still large amount of disease, with residual TNTC <25m tumor areas. Resumed chemotherapy, regimen changed to weekly dose dense carbo taxol beginning 04-19-14 (carbo only day 1 cycle 1). Day 15 cycle 1 held with ANC 1.1 despite granix. Days 8 and 15 cycle 3 held with neutropenia and anemia.  Repeat scans and see Dr RDenman Georgeas above. I will schedule my next visit with next PAC flush, but can see her sooner if needed 2.hepatic cirrhosis, with mild splenomegaly/ suggestion of portal HTN: likely NASH. Liver function tests stable thru chemo.Known to Dr MLucio Edward 3.PAC in: will need to flush every 6-8 weeks if not being used. Patient agrees with keeping this. 4.diabetes on oral agent, does not follow blood sugars regularly at home.  5.stress urinary incontinence no worse 6.chronic cough: essentially resolved with medications by Dr WMelvyn Novas Past tobacco 7. 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  8,post TAH 1988 for benign  indications 9.nonSTEMI 2012 unremarkable cath then, HTN, known to Dr NAcie Fredrickson10.chronic Estrace x years for hot flashes. Tapered somewhat but intolerable hot flashes when lower than 1/2 tab daily  11.narrow angle glaucoma: following with ophthalmology 12.restless legs: severe, chronically interferes with sleep. Has not responded to multiple treatment attempts, but better with oxyIR as above. [facility] be [facility]. 13.chemo peripheral neuropathy: no worse since most recent treatment.   All questions answered. Time spent 25 min including >50% counseling and coordination of care.    LGordy Levan MD   06/30/2014, 5:49 PM

## 2014-07-03 DIAGNOSIS — T451X5A Adverse effect of antineoplastic and immunosuppressive drugs, initial encounter: Secondary | ICD-10-CM | POA: Insufficient documentation

## 2014-07-03 DIAGNOSIS — E119 Type 2 diabetes mellitus without complications: Secondary | ICD-10-CM | POA: Insufficient documentation

## 2014-07-03 DIAGNOSIS — D6481 Anemia due to antineoplastic chemotherapy: Secondary | ICD-10-CM | POA: Insufficient documentation

## 2014-07-04 ENCOUNTER — Encounter (HOSPITAL_COMMUNITY): Payer: Self-pay

## 2014-07-04 ENCOUNTER — Ambulatory Visit (HOSPITAL_COMMUNITY)
Admission: RE | Admit: 2014-07-04 | Discharge: 2014-07-04 | Disposition: A | Discharge status: Home / Self Care | Payer: Medicare Other | Source: Ambulatory Visit | Attending: Oncology | Admitting: Oncology

## 2014-07-04 DIAGNOSIS — C786 Secondary malignant neoplasm of retroperitoneum and peritoneum: Secondary | ICD-10-CM | POA: Diagnosis not present

## 2014-07-04 DIAGNOSIS — C569 Malignant neoplasm of unspecified ovary: Secondary | ICD-10-CM | POA: Diagnosis present

## 2014-07-04 DIAGNOSIS — K7460 Unspecified cirrhosis of liver: Secondary | ICD-10-CM | POA: Diagnosis not present

## 2014-07-04 DIAGNOSIS — C482 Malignant neoplasm of peritoneum, unspecified: Secondary | ICD-10-CM

## 2014-07-04 DIAGNOSIS — R161 Splenomegaly, not elsewhere classified: Secondary | ICD-10-CM | POA: Insufficient documentation

## 2014-07-04 DIAGNOSIS — I864 Gastric varices: Secondary | ICD-10-CM | POA: Insufficient documentation

## 2014-07-04 MED ORDER — IOHEXOL 300 MG/ML  SOLN
100.0000 mL | Freq: Once | INTRAMUSCULAR | Status: AC | PRN
  Administered 2014-07-04: 100 mL via INTRAVENOUS

## 2014-07-06 ENCOUNTER — Ambulatory Visit: Payer: Medicare Other | Attending: Gynecologic Oncology | Admitting: Gynecologic Oncology

## 2014-07-06 ENCOUNTER — Encounter: Payer: Self-pay | Admitting: Gynecologic Oncology

## 2014-07-06 VITALS — BP 141/44 | HR 63 | Temp 98.5°F | Resp 20 | Ht 61.0 in | Wt 187.3 lb

## 2014-07-06 DIAGNOSIS — C482 Malignant neoplasm of peritoneum, unspecified: Secondary | ICD-10-CM

## 2014-07-06 DIAGNOSIS — Z8589 Personal history of malignant neoplasm of other organs and systems: Secondary | ICD-10-CM | POA: Diagnosis not present

## 2014-07-06 NOTE — Patient Instructions (Signed)
Plan to follow up in October or sooner if needed.  Please call for the development of any new symptoms or concerns.

## 2014-07-06 NOTE — Progress Notes (Signed)
Primary Peritoneal Cancer Followup Note: Gyn-Onc  Consult was initially requested by Dr. Ronita Hipps for the evaluation of Olivia Benson("Olivia Benson"), a 69 y.o. female with stage IIIC primary peritoneal cancer  CC: Dr Brien Few Chief Complaint  Patient presents with  . peritoneal carcinoma    Assessment/Plan:  [name]") is a 69 y.o.  year old G1 with stage III primary peritoneal cancer now NED (no evidence of disease) after neoadjuvant chemotherapy (5 cycles), interval BSO, omentectomy, debulking procedure to optimal cytoreduction on 03/29/14, and 3 additional cycles of carboplatin and paclitaxel chemotherapy completed May, 2016.  I discussed with Olivia Benson that I am not recommending additional therapy at this time.  We will enter a surveillance phase with examinations every 3 months with CA 125 assessments, pelvic examinations and symptom assessments. I discussed the high (70%) liklihood of recurrence.   HPI: Olivia Benson is a 69 year old G1P1 who was seen in consultation at the request of Dr Ronita Hipps in October 2015 for the findings of a pelvic mass on examination, CT findings consistent with ovarian cancer and an elevated CA 125. Olivia Benson had approximately 1 year history of cough, for which she underwent workup with Dr [name], a pulmonologist at [facility]. With extensive evaluation and medication modification, she feels "a million percent better". However, she continued to have stress urinary incontinence ( a symptom she has had for approximately 2 years). She had initially felt the SUI was associated with the cough, however, when she noticed that it did not improve with resolution of her cough, she sought further evaluation.  In September 2015 she was seen by her OBGYN Dr Ronita Hipps for the persistent SUI symptoms. He performed a pelvic examination which revealed a pelvic mass. He ordered a CA 125: 1195 on 11/08/13; and a CT of the abdomen and pelvis on 11/12/13 which showed:  - A large amount of ascites  present.  - Findings of progressive hepatic cirrhosis with a large amount of ascites and mild splenomegaly. There are venous collaterals present.The liver is shrunken and its surface is markedly irregular with mild splenomegaly. There prominence of mesenteric venous structures.   - The urinary bladder is partially distended and grossly normal.  - Increased peritoneal soft tissue density within the pelvis fairly symmetric from right to left. This could reflect mesenteric implants in the appropriate clinical setting. There are similar findings noted along the anterior mid and lower peritoneal surface on the left.  - No definite focal adnexal masses are demonstrated.  - There is a small left pleural effusion layering posteriorly.     The patient has had a hysterectomy for benign indications in 1988 (TAH). She has a sister, a maternal aunt and a maternal cousin with breast cancer (none have been tested for BRCA).  Of note, in 2013 she had undergone a CT to work up abnormal LFT's. This showed an enlarged irregularly contoured liver and splenomegaly consistent with cirrhosis. A cirrhosis workup was unremarkable and her LFT's trended down, and therefore no further action was taken. She denies alcoholism, and rarely drinks.  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  She went on to receive 5 cycles of neoadjuvant chemotherapy with paclitaxel and carboplatin q 21 days. She has tolerated therapy very well with no major dose limiting toxicities or treatment delays. Her most recent CA 125 on day 1 of cycle 4 was 130. She had required paracenteses x 3  during her first cycles of therapy to control ascites symptoms.  CT after cycle 4 (02/23/14):  1. Interval decrease in ascites. 2. Morphologic changes in the liver consistent with cirrhosis. 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 Portal vein remains patent at this time. 3. Persistent but decreased abnormal soft tissue attenuation tracking in the omental fat. This may be secondary to interval improvement in metastatic disease. 4. Peritoneal thickening seen in the left abdomen and pelvis on the previous study has decreased and nearly resolved in the interval. This also suggests interval improvement in metastatic disease.  On April 08 2014 she underwent an exploratory laparotomy with BSO and radical tumor debulking. Intraoperative findings included a 10 cm omental cake, miliary studding of tumor implants on the small intestine measuring less than 3 mm each, small-volume ascites, small ovaries bilaterally with no apparent primary ovarian tumor, cirrhotic liver with hepatomegaly and splenomegaly. She underwent an optimal cytoreductive surgery and recovered in an uncomplicated fashion from surgery.  She went on to receive an additional 3 cycles of dose dense paclitaxel and carboplatin adjuvant chemotherapy completed in May 2016. She had dose delays secondary to bone marrow toxicity.  CA 125 drawn on her last cycle of chemotherapy in May 2016 was normal at 19.  CT scan of the chest, abdomen and pelvis on 07/04/14 after her last chemotherapy cycle showed:  IMPRESSION: 1. Interval improvement in the appearance of peritoneal metastasis secondary to ovarian cancer. 2. 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  Interval History: The patient reports improved appetite,and diet since completing chemotherapy.  Current Meds:  Outpatient Encounter Prescriptions as of 07/06/2014  Medication Sig  . estradiol (ESTRACE) 1 MG tablet Take 1 mg by mouth every other day.   . ibuprofen (ADVIL,MOTRIN) 200 MG tablet Take 800 mg by mouth every 6 (six) hours as needed for moderate pain (pain).  Marland Kitchen lidocaine-prilocaine (EMLA) cream Apply 1 application topically as needed. Apply 1-2 hours prior to  [facility]  access as directed.  . loratadine (CLARITIN) 10 MG tablet Take 10 mg by mouth daily as needed (Only takes when getting chemo treatment).   . metFORMIN (GLUCOPHAGE-XR) 500 MG 24 hr tablet Take 500 mg by mouth daily.  Marland Kitchen oxyCODONE (OXY IR/ROXICODONE) 5 MG immediate release tablet Take 1-2 tablets by mouth every 4-6 hours as needed for pain.  . pantoprazole (PROTONIX) 40 MG tablet Take 1 tablet (40 mg total) by mouth 2 (two) times daily.  Marland Kitchen albuterol (PROVENTIL HFA;VENTOLIN HFA) 108 (90 BASE) MCG/ACT inhaler Inhale 2 puffs into the lungs every 4 (four) hours as needed for wheezing or shortness of breath.   . bisoprolol (ZEBETA) 5 MG tablet Take 1/2 tablet daily (Patient taking differently: Take 2.5 mg by mouth every morning. )  . dexamethasone (DECADRON) 4 MG tablet Take 5 tablets with food 12 hours prior to Taxol chemotherapy (Patient not taking: Reported on 06/30/2014)  . famotidine (PEPCID) 20 MG tablet Take 20 mg by mouth at bedtime as needed for heartburn (cough).   . LORazepam (ATIVAN) 1 MG tablet Place 1 tablet under the tongue or swallow every 6 hrs as needed for nausea or as directed (Patient not taking: Reported on 06/06/2014)  . mometasone-formoterol (DULERA) 100-5 MCG/ACT AERO Inhale 2 puffs into the lungs as needed for wheezing or shortness of breath.   . montelukast (SINGULAIR) 10 MG tablet Take 10 mg by mouth at bedtime.   . ondansetron (ZOFRAN) 8 MG tablet Take 1 tablet (8  mg total) by mouth every 8 (eight) hours as needed for nausea or vomiting. (Patient not taking: Reported on 05/09/2014)  . polyethylene glycol powder (GLYCOLAX/MIRALAX) powder Take 1 Container by mouth as needed.  . prochlorperazine (COMPAZINE) 10 MG tablet Take 1 tablet (10 mg total) by mouth every 6 (six) hours as needed for nausea or vomiting. (Patient not taking: Reported on 03/17/2014)  . sennosides-docusate sodium (SENOKOT-S) 8.6-50 MG tablet Take 1 tablet by mouth daily as needed for constipation  (constipation).   . temazepam (RESTORIL) 15 MG capsule Take 1 capsule (15 mg total) by mouth at bedtime as needed for sleep. (Patient not taking: Reported on 05/09/2014)  . traMADol (ULTRAM) 50 MG tablet Take 1 tablet (50 mg total) by mouth every 8 (eight) hours as needed for moderate pain. (Patient not taking: Reported on 04/11/2014)   No facility-administered encounter medications on file as of 07/06/2014.    Allergy:  Allergies  Allergen Reactions  . Tape Itching    Red itch with some tapes  . Benadryl [Diphenhydramine Hcl] Other (See Comments)    Restless legs   . Shellfish Allergy     HER THROAT SWELLS UP AND CLOSES  . Penicillins Rash    WHEN SHE WAS TEENAGER    Social Hx:   History   Social History  . Marital Status: Single    Spouse Name: N/A  . Number of Children: 1 D  . Years of Education: N/A   Occupational History  . Retired    Social History Main Topics  . Smoking status: Former Smoker -- 1.00 packs/day for 1 years    Types: Cigarettes    Quit date: 02/11/1982  . Smokeless tobacco: Current User  . Alcohol Use: Yes     Comment: socially  . Drug Use: No  . Sexual Activity: Not on file   Other Topics Concern  . Not on file   Social History Narrative  former smoker (quit in the 70's) Used to work teaching bar tending. Remote history of heavier drinking but "never a problem". She now drinks once every few months.  Past Surgical Hx:  Past Surgical History  Procedure Laterality Date  . Tonsillectomy  1971  . Abdominal ultrasound  Sept 2012    No gallbladder disease, + fatty liver  . Cardiac catheterization  10/25/2010    EF 60%; Normal coronaries  . Colonoscopy  10/18/2005  . Wh-mammography  11/24/2009  . Partial hysterectomy  1988  . Cosmetic surgery  2003  . Tubal ligation    . Dental surgery    . Salpingoophorectomy Bilateral 03/29/2014    Procedure: BILATERAL SALPINGO OOPHORECTOMY, OMENTECTOMY, DEBULKING;  Surgeon: Everitt Amber, MD;  Location: [facility]  ORS;  Service: Gynecology;  Laterality: Bilateral;  . Laparotomy N/A 03/29/2014    Procedure: EXPLORATORY LAPAROTOMY;  Surgeon: Everitt Amber, MD;  Location: [facility] ORS;  Service: Gynecology;  Laterality: N/A;    Past Medical Hx:  Past Medical History  Diagnosis Date  . Diabetes mellitus   . Asthma   . Overactive bladder   . Elevated LFTs   . Obesity   . Hypertension   . Cirrhosis   . Portal hypertension   . Glaucoma, narrow-angle   . Ovarian cancer 11/22/2013    Disseminated ovarian cancer  . Complication of anesthesia     slow to wake up / n & v  . PONV (postoperative nausea and vomiting)   . Family history of adverse reaction to anesthesia     multiple  family members have had problems such as slow to wake / "flat lined" /  ventilator    . Non-ST elevated myocardial infarction (non-STEMI) Sept 2012    Mild elevation in troponin and CK MB but no evidence of coronary disease at time of cath  . Tingling     hands and feet  . History of skin cancer   . Frequency of urination   . Restless leg syndrome     Past Gynecological History:  Hysterectomy for heavy periods (benign) in 1988.  No LMP recorded. Patient has had a hysterectomy.  Family Hx:  Family History  Problem Relation Age of Onset  . Asthma Mother   . Diabetes Mother   . Hypertension Mother   . Allergies Mother   . Rheum arthritis Mother   . Bladder Cancer Father 70    former heavy smoker  . Hypertension Father   . Stroke Father   . Breast cancer Sister 35  . Diabetes Brother   . Allergies Brother   . Asthma Brother   . Diabetes Sister     x 3  . Allergies Sister   . Asthma Sister     x 2   . Breast cancer Maternal Aunt 85  . Breast cancer Cousin 83    (maternal) bilateral cancer  . Aneurysm Son 36    Review of Systems:  Constitutional  Feels well,  See HPI  ENT Normal appearing ears and nares bilaterally Skin/Breast  No rash, sores, jaundice, itching, dryness Cardiovascular  No chest pain,  shortness of breath, or edema  Pulmonary  No cough or wheeze.  Gastro Intestinal  No nausea, vomitting, or diarrhoea. No bright red blood per rectum, no abdominal pain, change in bowel movement, or constipation. Incision healed Genito Urinary  No frequency, urgency, dysuria, + stress incontinence.  Musculo Skeletal  No deformities Neurologic  No weakness, numbness, change in gait,  Psychology  No depression, anxiety, insomnia.   Vitals:  Blood pressure 141/44, pulse 63, temperature 98.5 F (36.9 C), temperature source Oral, resp. rate 20, height 5' 1"  (1.549 m), weight 187 lb 4.8 oz (84.959 kg).  Physical Exam: WD in NAD Neck  Supple NROM, without any enlargements.  Lymph Node Survey No cervical supraclavicular or inguinal adenopathy Cardiovascular  Pulse normal rate, regularity and rhythm. S1 and S2 normal.  Lungs  Clear to auscultation bilateraly, without wheezes/crackles/rhonchi. Good air movement.  Skin  Erythema and edema on distal right arm  Psychiatry  Alert and oriented to person, place, and time  Abdomen  Normoactive bowel sounds, abdomen soft, non-tender and overweight without evidence of hernia. No dullness to percussion. Incision healed Back No CVA tenderness Genito Urinary  Vulva/vagina: Normal external female genitalia.  No lesions. No discharge or bleeding.  Bladder/urethra:  No lesions or masses, well supported bladder  Vagina: normal in appearance  Cervix: surgically absent  Uterus: surgically absent   Adnexa: no fullness or masses appreciated. Rectal  deferred Extremities  No bilateral cyanosis, clubbing or edema.   Donaciano Eva, MD   07/06/2014, 5:17 PM

## 2014-07-25 ENCOUNTER — Other Ambulatory Visit: Payer: Self-pay

## 2014-07-25 DIAGNOSIS — C569 Malignant neoplasm of unspecified ovary: Secondary | ICD-10-CM

## 2014-07-25 MED ORDER — OXYCODONE HCL 5 MG PO TABS: ORAL_TABLET | ORAL | Status: DC

## 2014-07-25 NOTE — Telephone Encounter (Signed)
Told [name] that her oxycodone prescription would be with the injection nurse tomorrow for her to pick up.

## 2014-08-15 ENCOUNTER — Other Ambulatory Visit: Payer: Self-pay | Admitting: Oncology

## 2014-08-15 DIAGNOSIS — C482 Malignant neoplasm of peritoneum, unspecified: Secondary | ICD-10-CM

## 2014-08-15 DIAGNOSIS — C569 Malignant neoplasm of unspecified ovary: Secondary | ICD-10-CM

## 2014-08-16 ENCOUNTER — Other Ambulatory Visit: Payer: Medicare Other

## 2014-08-18 ENCOUNTER — Ambulatory Visit (HOSPITAL_BASED_OUTPATIENT_CLINIC_OR_DEPARTMENT_OTHER): Payer: Medicare Other | Admitting: Oncology

## 2014-08-18 ENCOUNTER — Encounter: Payer: Self-pay | Admitting: Oncology

## 2014-08-18 ENCOUNTER — Ambulatory Visit (HOSPITAL_BASED_OUTPATIENT_CLINIC_OR_DEPARTMENT_OTHER): Payer: Medicare Other

## 2014-08-18 ENCOUNTER — Other Ambulatory Visit (HOSPITAL_BASED_OUTPATIENT_CLINIC_OR_DEPARTMENT_OTHER): Payer: Medicare Other

## 2014-08-18 ENCOUNTER — Telehealth: Payer: Self-pay | Admitting: Oncology

## 2014-08-18 VITALS — BP 141/54 | HR 62 | Temp 98.0°F | Resp 18 | Ht 61.0 in | Wt 191.4 lb

## 2014-08-18 DIAGNOSIS — Z95828 Presence of other vascular implants and grafts: Secondary | ICD-10-CM

## 2014-08-18 DIAGNOSIS — C786 Secondary malignant neoplasm of retroperitoneum and peritoneum: Secondary | ICD-10-CM

## 2014-08-18 DIAGNOSIS — G2581 Restless legs syndrome: Secondary | ICD-10-CM | POA: Diagnosis not present

## 2014-08-18 DIAGNOSIS — D6959 Other secondary thrombocytopenia: Secondary | ICD-10-CM | POA: Diagnosis not present

## 2014-08-18 DIAGNOSIS — K7581 Nonalcoholic steatohepatitis (NASH): Secondary | ICD-10-CM | POA: Diagnosis not present

## 2014-08-18 DIAGNOSIS — M150 Primary generalized (osteo)arthritis: Secondary | ICD-10-CM

## 2014-08-18 DIAGNOSIS — C569 Malignant neoplasm of unspecified ovary: Secondary | ICD-10-CM

## 2014-08-18 DIAGNOSIS — M159 Polyosteoarthritis, unspecified: Secondary | ICD-10-CM

## 2014-08-18 DIAGNOSIS — M8949 Other hypertrophic osteoarthropathy, multiple sites: Secondary | ICD-10-CM

## 2014-08-18 DIAGNOSIS — C482 Malignant neoplasm of peritoneum, unspecified: Secondary | ICD-10-CM

## 2014-08-18 DIAGNOSIS — K7460 Unspecified cirrhosis of liver: Secondary | ICD-10-CM

## 2014-08-18 LAB — CBC WITH DIFFERENTIAL/PLATELET
BASO%: 0.5 % (ref 0.0–2.0)
Basophils Absolute: 0 10*3/uL (ref 0.0–0.1)
EOS%: 2.1 % (ref 0.0–7.0)
Eosinophils Absolute: 0.1 10*3/uL (ref 0.0–0.5)
HCT: 33.8 % — ABNORMAL LOW (ref 34.8–46.6)
HGB: 10.9 g/dL — ABNORMAL LOW (ref 11.6–15.9)
LYMPH%: 25.2 % (ref 14.0–49.7)
MCH: 26.9 pg (ref 25.1–34.0)
MCHC: 32.1 g/dL (ref 31.5–36.0)
MCV: 83.6 fL (ref 79.5–101.0)
MONO#: 0.2 10*3/uL (ref 0.1–0.9)
MONO%: 5.5 % (ref 0.0–14.0)
NEUT#: 2.7 10*3/uL (ref 1.5–6.5)
NEUT%: 66.7 % (ref 38.4–76.8)
Platelets: 129 10*3/uL — ABNORMAL LOW (ref 145–400)
RBC: 4.05 10*6/uL (ref 3.70–5.45)
RDW: 18.4 % — ABNORMAL HIGH (ref 11.2–14.5)
WBC: 4.1 10*3/uL (ref 3.9–10.3)
lymph#: 1 10*3/uL (ref 0.9–3.3)

## 2014-08-18 LAB — COMPREHENSIVE METABOLIC PANEL (CC13)
ALT: 58 U/L — ABNORMAL HIGH (ref 0–55)
AST: 45 U/L — ABNORMAL HIGH (ref 5–34)
Albumin: 3.7 g/dL (ref 3.5–5.0)
Alkaline Phosphatase: 116 U/L (ref 40–150)
Anion Gap: 9 mEq/L (ref 3–11)
BUN: 13.3 mg/dL (ref 7.0–26.0)
CO2: 24 mEq/L (ref 22–29)
Calcium: 8.8 mg/dL (ref 8.4–10.4)
Chloride: 108 mEq/L (ref 98–109)
Creatinine: 0.9 mg/dL (ref 0.6–1.1)
EGFR: 71 mL/min/{1.73_m2} — ABNORMAL LOW (ref >90)
Glucose: 184 mg/dl — ABNORMAL HIGH (ref 70–140)
Potassium: 3.9 mEq/L (ref 3.5–5.1)
Sodium: 141 mEq/L (ref 136–145)
Total Bilirubin: 0.59 mg/dL (ref 0.20–1.20)
Total Protein: 6.7 g/dL (ref 6.4–8.3)

## 2014-08-18 MED ORDER — OXYCODONE HCL 5 MG PO TABS: ORAL_TABLET | ORAL | Status: DC

## 2014-08-18 MED ORDER — SODIUM CHLORIDE 0.9 % IJ SOLN
10.0000 mL | INTRAMUSCULAR | Status: DC | PRN
  Administered 2014-08-18: 10 mL via INTRAVENOUS
  Filled 2014-08-18: qty 10

## 2014-08-18 MED ORDER — HEPARIN SOD (PORK) LOCK FLUSH 100 UNIT/ML IV SOLN
500.0000 [IU] | Freq: Once | INTRAVENOUS | Status: AC
  Administered 2014-08-18: 500 [IU] via INTRAVENOUS
  Filled 2014-08-18: qty 5

## 2014-08-18 NOTE — Telephone Encounter (Signed)
Appointments made and avs printed for patient,ref to [facility] has been edited to call pt from work q and she will call [facility] as she is established with them

## 2014-08-18 NOTE — Patient Instructions (Signed)

## 2014-08-18 NOTE — Progress Notes (Signed)
OFFICE PROGRESS NOTE   August 18, 2014   Physicians:Emma Barrington Ellison, MD (PCP), Luvenia Starch.Wert, P.Nahser, M.Stark/ J.Mann. Referral back to orthopedics [facility]) and new patient referral to Dr Wells Guiles Tat.   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 She had CT AP 07-04-14, saw Dr Denman George on 07-06-14 and will see her again on 11-21-14.   Patient has felt gradually stronger since she has been off of chemotherapy, now able to resume previous work schedule, tho she is exhausted by afternoon. Restless legs are severe by evening, very poor sleep due to this problem. She gets some relief with the restless legs and chronic low back pain with oxycodone 5 mg 1-2 tablets at hs. Appetite and taste are better, no significant nausea now, bowels moving regularly. Peripheral neuropathy in feet is back to baseline since off chemo. No problems with PAC, which she prefers to keep for now due to high risk of recurrent disease.   PAC flushed today Genetics testing from 02-2014 normal ([employer] panel) CA 125 1195 at diagnosis. ER + on cytology 11-18-13  ONCOLOGIC HISTORY Patient initially had problems in ~ July 2015 with stress urinary incontinence related to severe chronic cough. Urine culture by PCP showed no infection and she was referred to Dr Clois Comber, who adjusted medications with great improvement in the cough. Despite improved cough, she continued with some urinary incontinence. She saw Dr Ronita Hipps in 10-2013, with pelvic mass on exam and CA 125 1195 on 11-08-13. She had CT AP 11-12-13, which showed large ascites, irregular and shrunken liver, mild splenomegaly, increased soft tissue in pelvis bilaterally and anterior mid and lower peritoneum, small left pleural effusion. She  saw Dr Denman George in consultation on 11-15-13, who agreed that this was likely gyn malignancy. She had US paracentesis of 4.1 liters on 11-18-13, with cytology ([id number]) and immunohistochemical stains consistent with serous carcinoma of Mullerian tract primary, favor ovarian, ER patchy positive. She had second paracentesis for 3.6 liters on 12-03-13. Gyn oncology recommended initial treatment with chemotherapy. She had first carboplatin and taxol on 12-07-13, with carboplatin dosed at AUC=4 and taxol at 135 mg/m2 due to concern for cirrhosis. She has needed gCSF support and even so was admitted with febrile neutropenia after cycle 3. CA125 was 1938 on 12-01-13 as baseline for chemotherapy, down to 130 prior to cycle 4 and 45 after cycle 5. 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 Chemo resumed 04-19-14, regimen changed to dose dense carbo taxol with granix days 2,3,9,16. She tolerated the dose dense regimen better overall, tho cycle 3 days 8 and 15 were held with neutropenia, symptomatic anemia and fatigue. CT AP 07-04-14 had no obvious recurrent or persistent cancer.     Review of systems as above, also: No fever or symptoms of infection. Not SOB with limited activity. Bladder ok. No swelling LE. No bleeding. PCP asked her to increase metformin due to more elevated Hgb A1c, however the higher dose gave GI symptoms such that she has decreased this. Is to see specialist as increased pressure from narrow angle glaucoma. No SOB or significant cough. Remainder of 10 point Review of Systems negative.  Objective:  Vital signs in last  24 hours:  BP 141/54 mmHg  Pulse 62  Temp(Src) 98 F (36.7 C) (Oral)  Resp 18  Ht 5' 1"  (1.549 m)  Wt 191 lb 6.4 oz (86.818 kg)  BMI 36.18 kg/m2  SpO2 100% Weight up 4 lbs Alert, oriented and appropriate,  just delightful as always. Ambulatory without assistance.  Hair is growing back  HEENT:PERRL, sclerae not icteric. Oral mucosa moist without lesions, posterior pharynx clear.  Neck supple. No JVD.  Lymphatics:no cervical,supraclavicular, axillary or inguinal adenopathy Resp: clear to auscultation bilaterally and normal percussion bilaterally. No coughing Cardio: regular rate and rhythm. No gallop. GI: abdomen obese, soft, nontender, not distended, no mass or organomegaly. Normally active bowel sounds. Surgical incision not remarkable. Musculoskeletal/ Extremities: without pitting edema, cords, tenderness Neuro: no restless legs during exam. Psych appropriate mood and affect Skin without rash, ecchymosis, petechiae Breasts: without dominant mass, skin or nipple findings. Well healed scars from plastic surgery implants. Axillae benign. Portacath-without erythema or tenderness  Lab Results:  Results for orders placed or performed in visit on 08/18/14  CBC with Differential  Result Value Ref Range   WBC 4.1 3.9 - 10.3 10e3/uL   NEUT# 2.7 1.5 - 6.5 10e3/uL   HGB 10.9 (L) 11.6 - 15.9 g/dL   HCT 33.8 (L) 34.8 - 46.6 %   Platelets 129 (L) 145 - 400 10e3/uL   MCV 83.6 79.5 - 101.0 fL   MCH 26.9 25.1 - 34.0 pg   MCHC 32.1 31.5 - 36.0 g/dL   RBC 4.05 3.70 - 5.45 10e6/uL   RDW 18.4 (H) 11.2 - 14.5 %   lymph# 1.0 0.9 - 3.3 10e3/uL   MONO# 0.2 0.1 - 0.9 10e3/uL   Eosinophils Absolute 0.1 0.0 - 0.5 10e3/uL   Basophils Absolute 0.0 0.0 - 0.1 10e3/uL   NEUT% 66.7 38.4 - 76.8 %   LYMPH% 25.2 14.0 - 49.7 %   MONO% 5.5 0.0 - 14.0 %   EOS% 2.1 0.0 - 7.0 %   BASO% 0.5 0.0 - 2.0 %   CMET available after visit normal with exception of nonfasting glucose 184, AST 45, ALT 58  CA 125 available after visit 16, this having been 19 on 06-10-14   Studies/Results: CT ABDOMEN AND PELVIS WITH CONTRAST  COMPARISON: 02/23/2014  FINDINGS: Lower chest: Atelectasis is identified within the right lung  base. No pleural effusions.  Hepatobiliary: The liver has an irregular contour. Hypertrophy of the caudate lobe and lateral segment of left lobe is noted. No focal liver abnormality.  Pancreas: Normal appearance of the pancreas.  Spleen: The spleen measures 13 cm in length, image 56 of series 602.  Adrenals/Urinary Tract: Normal appearance of the adrenal glands. The kidneys are both unremarkable. Urinary bladder is normal.  Stomach/Bowel: The stomach is normal. The small bowel loops have a normal course and caliber. Normal appearance of the colon. No bowel obstruction.  Vascular/Lymphatic: Normal appearance of the abdominal aorta. Gastric varices identified. No retroperitoneal adenopathy. No pelvic or inguinal adenopathy.  Other: There is been significant improvement in the appearance of peritoneal disease. A small amount of loculated fluid is identified overlying the liver, decreased from previous exam, image 15 of series 2. The previously noted soft tissue infiltration within the omentum is not visualized on today's exam. No new or progressive peritoneal disease identified.  Musculoskeletal: Review of the visualized osseous structures is negative for aggressive lytic or sclerotic bone lesion. Degenerative disc disease is noted within the lumbar spine.  IMPRESSION: 1. Interval improvement in  the appearance of peritoneal metastasis secondary to ovarian cancer. 2. 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   Medications: I have reviewed the patient's current medications.   DISCUSSION:  Oxycodone rewritten, tho it would be preferable if other interventions available for the chronic orthopedic problems and for restless legs. I had mentioned restless legs problem previously to Dr Tat, who kindly offered to see her, tho did say that restless leg syndrome can be very challenging. Patient is in agreement with this referral, and also with referral to  orthopedics (has seen several of these MDs over years, including Dr Gladstone Lighter for shoulder).  We are delighted that she is doing well from standpoint of the gyn cancer. Agree with keeping PAC for now, flushes every 6-8 weeks when not otherwise used.   Assessment/Plan:   1.high grade serous primary peritoneal carcinoma: Neoadjuvant chemotherapy with carboplatin and taxol 12-07-13 thru cycle 5 03-08-14, with neulasta. Interval optimal debulking of what was still large amount of disease, with residual TNTC <38m tumor areas. Resumed chemotherapy, regimen changed to weekly dose dense carbo taxol beginning 04-19-14 (carbo only day 1 cycle 1). Day 15 cycle 1 held with ANC 1.1 despite granix. Days 8 and 15 cycle 3 held with neutropenia and anemia.NED by restaging in late May and marker still in good low range today, tho high risk for recurrence. I will see her with next PAC flush + labs ~ Sept 1, then Dr RDenman Georgein Oct.  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 Liver function tests stable thru chemo.Known to Dr MLucio Edward 3.PAC in: will need to flush every 6-8 weeks if not being used. Patient agrees with keeping this. 4.diabetes on oral agent, does not follow blood sugars regularly at home.  5.stress urinary incontinence no worse 6.chronic cough: essentially resolved with medications by Dr WMelvyn Novas Past tobacco 7. 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 Improved tho still below normal, no bleeding. Follow. 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 Tapered somewhat but intolerable hot flashes when lower than 1/2 tab daily  11.narrow angle glaucoma: following with ophthalmology 12.restless legs: severe, chronically interferes with sleep. Has not responded to multiple treatment attempts, a little better with oxyIR  as above. Refer as new consult to Dr Tat 13.chemo peripheral neuropathy essentially resolved, still baseline neuropathy in feet thought related to DM 14.various orthopedic problems including back, which is more bothersome now. WIll ask orthopedics to see again  All questions answered, patient is in agreement with recommendations and plans. She knows to call if concerns prior to next scheduled visit  Time spent 25 min including >50% couseling and coordination of care.   LIVESAY,LENNIS P, MD   08/18/2014, 8:40 AM

## 2014-08-19 ENCOUNTER — Telehealth: Payer: Self-pay

## 2014-08-19 LAB — CA 125: CA 125: 16 U/mL (ref <35)

## 2014-08-19 NOTE — Telephone Encounter (Signed)
-----   Message from Gordy Levan, MD sent at 08/19/2014 12:13 PM EDT ----- Labs seen and need follow up please let her know CA 125 in good low range at 16

## 2014-08-19 NOTE — Telephone Encounter (Signed)
Told Ms Sherlynn Stalls the result of the CA-125 as noted below by Dr. Marko Plume.

## 2014-08-25 ENCOUNTER — Ambulatory Visit (INDEPENDENT_AMBULATORY_CARE_PROVIDER_SITE_OTHER): Payer: Medicare Other | Admitting: Neurology

## 2014-08-25 ENCOUNTER — Encounter: Payer: Self-pay | Admitting: Neurology

## 2014-08-25 VITALS — BP 106/68 | HR 72 | Ht 61.5 in | Wt 192.0 lb

## 2014-08-25 DIAGNOSIS — F508 Other eating disorders: Secondary | ICD-10-CM | POA: Diagnosis not present

## 2014-08-25 DIAGNOSIS — F5089 Other specified eating disorder: Secondary | ICD-10-CM

## 2014-08-25 DIAGNOSIS — D509 Iron deficiency anemia, unspecified: Secondary | ICD-10-CM

## 2014-08-25 DIAGNOSIS — G2581 Restless legs syndrome: Secondary | ICD-10-CM

## 2014-08-25 MED ORDER — PRAMIPEXOLE DIHYDROCHLORIDE 0.125 MG PO TABS: ORAL_TABLET | ORAL | Status: DC

## 2014-08-25 NOTE — Patient Instructions (Addendum)
1.  Start pramipexole - 0.125 mg at night for a week and then take 1 tablet after dinner (or when you sit down) and 1 at night for a week and then if still bother you take 1 after dinner and 2 at night

## 2014-08-25 NOTE — Progress Notes (Signed)
Olivia Benson was seen today in neurologic consultation at the request of Dr. Marko Plume. Her PCP is Helane Rima, MD.  The consultation is for RLS.  Prior records were reviewed.  The patient also has a history of primary peritoneal carcinomatosis, grade 3.  She has undergone previous debulking surgery and chemotherapy with carboplatin and Taxol, which was last completed on 05/30/2014.  Her chemotherapy has been complicated by neutropenia, peripheral neuropathy, symptomatically anemia and fatigue.  She presents today to discuss restless leg syndrome.  Pt states that her sx's consist of a crawling sensation on the legs; she feels that she has a spider web on the leg; "I just have to move."  It may have started pre-chemo but got much worse after chemo.  It starts mid afternoon once she sits and "gets quiet" and tries to watch TV.    She has previously tried flexeril, baclofen, ultram, restoril and oxycontin.  The only one that has been helpful is oxyIR.  She has never been on requip, mirapex, levodopa, klonopin.    She admits that she craves ice.  She has been anemic.  Her last Hgb was 10.6.     PREVIOUS MEDICATIONS: n/a  ALLERGIES:   Allergies  Allergen Reactions  . Tape Itching    Red itch with some tapes  . Benadryl [Diphenhydramine Hcl] Other (See Comments)    Restless legs   . Shellfish Allergy     HER THROAT SWELLS UP AND CLOSES  . Penicillins Rash    WHEN SHE WAS TEENAGER    CURRENT MEDICATIONS:  Outpatient Encounter Prescriptions as of 08/25/2014  Medication Sig  . amLODipine (NORVASC) 2.5 MG tablet Take 2.5 mg by mouth daily.  . bisoprolol (ZEBETA) 5 MG tablet Take 1/2 tablet daily (Patient taking differently: Take 2.5 mg by mouth every morning. )  . estradiol (ESTRACE) 1 MG tablet Take 1 mg by mouth every other day.   . lidocaine-prilocaine (EMLA) cream Apply 1 application topically as needed. Apply 1-2 hours prior to [facility]  access as directed.  . metFORMIN  (GLUCOPHAGE-XR) 500 MG 24 hr tablet Take 500 mg by mouth 2 (two) times daily.   Marland Kitchen oxyCODONE (OXY IR/ROXICODONE) 5 MG immediate release tablet Take 1-2 tablets by mouth every 4-6 hours as needed for pain.  . pantoprazole (PROTONIX) 40 MG tablet Take 1 tablet (40 mg total) by mouth 2 (two) times daily.  Marland Kitchen albuterol (PROVENTIL HFA;VENTOLIN HFA) 108 (90 BASE) MCG/ACT inhaler Inhale 2 puffs into the lungs every 4 (four) hours as needed for wheezing or shortness of breath.   Marland Kitchen ibuprofen (ADVIL,MOTRIN) 200 MG tablet Take 800 mg by mouth every 6 (six) hours as needed for moderate pain (pain).  . [DISCONTINUED] famotidine (PEPCID) 20 MG tablet Take 20 mg by mouth at bedtime as needed for heartburn (cough).   . [DISCONTINUED] loratadine (CLARITIN) 10 MG tablet Take 10 mg by mouth daily as needed (Only takes when getting chemo treatment).   . [DISCONTINUED] LORazepam (ATIVAN) 1 MG tablet Place 1 tablet under the tongue or swallow every 6 hrs as needed for nausea or as directed (Patient not taking: Reported on 06/06/2014)  . [DISCONTINUED] mometasone-formoterol (DULERA) 100-5 MCG/ACT AERO Inhale 2 puffs into the lungs as needed for wheezing or shortness of breath.   . [DISCONTINUED] montelukast (SINGULAIR) 10 MG tablet Take 10 mg by mouth at bedtime.   . [DISCONTINUED] ondansetron (ZOFRAN) 8 MG tablet Take 1 tablet (8 mg total) by mouth every 8 (eight)  hours as needed for nausea or vomiting. (Patient not taking: Reported on 05/09/2014)  . [DISCONTINUED] polyethylene glycol powder (GLYCOLAX/MIRALAX) powder Take 1 Container by mouth as needed.  . [DISCONTINUED] prochlorperazine (COMPAZINE) 10 MG tablet Take 1 tablet (10 mg total) by mouth every 6 (six) hours as needed for nausea or vomiting. (Patient not taking: Reported on 03/17/2014)  . [DISCONTINUED] sennosides-docusate sodium (SENOKOT-S) 8.6-50 MG tablet Take 1 tablet by mouth daily as needed for constipation (constipation).    No facility-administered encounter  medications on file as of 08/25/2014.    PAST MEDICAL HISTORY:   Past Medical History  Diagnosis Date  . Diabetes mellitus   . Asthma   . Overactive bladder   . Elevated LFTs   . Obesity   . Hypertension   . Cirrhosis   . Portal hypertension   . Glaucoma, narrow-angle   . Ovarian cancer 11/22/2013    Disseminated ovarian cancer  . Complication of anesthesia     slow to wake up / n & v  . PONV (postoperative nausea and vomiting)   . Family history of adverse reaction to anesthesia     multiple family members have had problems such as slow to wake / "flat lined" /  ventilator    . Non-ST elevated myocardial infarction (non-STEMI) Sept 2012    Mild elevation in troponin and CK MB but no evidence of coronary disease at time of cath  . Tingling     hands and feet  . History of skin cancer   . Frequency of urination   . Restless leg syndrome     PAST SURGICAL HISTORY:   Past Surgical History  Procedure Laterality Date  . Tonsillectomy  1971  . Abdominal ultrasound  Sept 2012    No gallbladder disease, + fatty liver  . Cardiac catheterization  10/25/2010    EF 60%; Normal coronaries  . Colonoscopy  10/18/2005  . Wh-mammography  11/24/2009  . Partial hysterectomy  1988  . Cosmetic surgery  2003  . Tubal ligation    . Dental surgery    . Salpingoophorectomy Bilateral 03/29/2014    Procedure: BILATERAL SALPINGO OOPHORECTOMY, OMENTECTOMY, DEBULKING;  Surgeon: Everitt Amber, MD;  Location: [facility];  Service: Gynecology;  Laterality: Bilateral;  . Laparotomy N/A 03/29/2014    Procedure: EXPLORATORY LAPAROTOMY;  Surgeon: Everitt Amber, MD;  Location: [facility];  Service: Gynecology;  Laterality: N/A;    SOCIAL HISTORY:   History   Social History  . Marital Status: Single    Spouse Name: N/A  . Number of Children: 1 D  . Years of Education: N/A   Occupational History  . Retired    Social History Main Topics  . Smoking status: Former Smoker -- 1.00 packs/day for 1 years    Types:  Cigarettes    Quit date: 02/11/1982  . Smokeless tobacco: Current User  . Alcohol Use: No  . Drug Use: No  . Sexual Activity: Not on file   Other Topics Concern  . Not on file   Social History Narrative    FAMILY HISTORY:   Family Status  Relation Status Death Age  . Mother Alive     arthritis, htn, DM  . Father Deceased 75    bladder cancer, stroke  . Sister Alive     breast cancer  . Sister Alive     htn  . Sister Alive     htn  . Sister Alive     htn  . Maternal  Aunt Alive   . Cousin Alive   . Maternal Uncle Deceased infancy  . Maternal Grandmother Deceased 31    pneumonia  . Maternal Grandfather Deceased   . Paternal Grandmother Deceased   . Paternal Grandfather Deceased   . Maternal Aunt Alive   . Maternal Aunt Alive   . Maternal Aunt Alive   . Son Deceased 47    died of brain anerysm, after a 20 ft fall  . Brother Alive     htn    ROS:  A complete 10 system review of systems was obtained and was unremarkable apart from what is mentioned above.  PHYSICAL EXAMINATION:    VITALS:   Filed Vitals:   08/25/14 0911  BP: 106/68  Pulse: 72  Height: 5' 1.5" (1.562 m)  Weight: 192 lb (87.091 kg)    GEN:  Normal appears female in no acute distress.  Appears stated age. HEENT:  Normocephalic, atraumatic. The mucous membranes are moist. The superficial temporal arteries are without ropiness or tenderness. Cardiovascular: Regular rate and rhythm. Lungs: Clear to auscultation bilaterally. Neck/Heme: There are no carotid bruits noted bilaterally.  NEUROLOGICAL: Orientation:  The patient is alert and oriented x 3.  Fund of knowledge is appropriate.  Recent and remote memory intact.  Attention span and concentration normal.  Repeats and names without difficulty. Cranial nerves: There is good facial symmetry. The pupils are equal round and reactive to light bilaterally. Fundoscopic exam reveals clear disc margins bilaterally. Extraocular muscles are intact and  visual fields are full to confrontational testing. Speech is fluent and clear. Soft palate rises symmetrically and there is no tongue deviation. Hearing is intact to conversational tone. Tone: Tone is good throughout. Sensation: Sensation is intact to light touch and pinprick throughout (facial, trunk, extremities). Vibration is decreased at the bilateral big toe. There is no extinction with double simultaneous stimulation. There is no sensory dermatomal level identified. Coordination:  The patient has no difficulty with RAM's or FNF bilaterally. Motor: Strength is 5/5 in the bilateral upper and lower extremities.  Shoulder shrug is equal and symmetric. There is no pronator drift.  There are no fasciculations noted. DTR's: Deep tendon reflexes are 1/4 at the bilateral biceps, triceps, brachioradialis, and absent at the bilateral patella and achilles.  Plantar responses are downgoing bilaterally. Gait and Station: The patient is able to ambulate without difficulty. The patient has trouble ambulating in a tandem fashion.  She can walk on her toes and heels.  She sways in the romberg position with eyes closed but is able to stand there.    Lab Results  Component Value Date   WBC 4.1 08/18/2014   HGB 10.9* 08/18/2014   HCT 33.8* 08/18/2014   MCV 83.6 08/18/2014   PLT 129* 08/18/2014   Lab Results  Component Value Date   TSH 3.432 10/24/2010      IMPRESSION/PLAN  1. RLS  -Long discussion with the patient today.  Greater than 50% of the 45 minute visit in counseling.  Talked to her about the fact that her restless leg is likely a consequence of the anemia.  This is also true with her pica (states that she has a cup of ice in the car waiting for her).  I am happy to try some medication for restless leg (and she actually has not been on very many primary restless leg drugs) that she understands that she will likely have the restless leg until the anemia is rectified.  She has been undergoing  transfusions.  She has never been on any of the dopamine agonists, levodopa itself, nor has she been on clonazepam.  Because she has the symptoms during the day, would rather start with a dopamine agonist and we ultimately decided on Mirapex.  I gave her a titration schedule which will work her up to 0.125 mg after dinner or when she first sits down in the evening and then 0.25 mg at bedtime.  -If not already done so, she probably should have her TSH checked.  I did not check it today, as her primary care physician is out of our system.  I will try to get a copy of labs done through her primary care physician. 2.  I plan to see the patient back in the next few months, sooner should new neurologic issues arise.

## 2014-08-30 ENCOUNTER — Telehealth: Payer: Self-pay | Admitting: *Deleted

## 2014-08-30 DIAGNOSIS — D509 Iron deficiency anemia, unspecified: Secondary | ICD-10-CM

## 2014-08-30 MED ORDER — FERROUS FUMARATE 324 (106 FE) MG PO TABS: 1.0000 | ORAL_TABLET | Freq: Every day | ORAL | Status: DC

## 2014-08-30 NOTE — Telephone Encounter (Signed)
Called and notified patient of information noted below by Dr. Marko Plume. Discussed both oral iron tablets and IV feraheme with patient and she states she would like to start with the tablets and will notify our office is she is unable to tolerate the tablets and will consider IV feraheme at that time. Prescription sent to patient's pharmacy. No other questions or concerns noted at this time. Patient instructed to call our office prior to next appt if she has any concerns.

## 2014-08-30 NOTE — Telephone Encounter (Signed)
-----   Message from Gordy Levan, MD sent at 08/26/2014  5:29 PM EDT ----- Saw Dr Tat re restless legs, thinks this is from iron deficiency. I am not sure why she is not on oral iron, but that sounds like a good idea. Please have her start Hemocyte (brand name if insurance will cover) otherwise ferrous fumarate no substitute, 325 mg daily on empty stomach with either OJ or with vit C tablet.  QS 30 days 3 RF if prescription, or tell pharmacy ferrous fumarate if not.  If patient has concerns about taking the oral iron, let me know, as we could give IV feraheme.  thanks

## 2014-09-29 ENCOUNTER — Encounter: Payer: Self-pay | Admitting: Gastroenterology

## 2014-10-09 ENCOUNTER — Other Ambulatory Visit: Payer: Self-pay | Admitting: Oncology

## 2014-10-09 DIAGNOSIS — C569 Malignant neoplasm of unspecified ovary: Secondary | ICD-10-CM

## 2014-10-09 DIAGNOSIS — D500 Iron deficiency anemia secondary to blood loss (chronic): Secondary | ICD-10-CM

## 2014-10-09 DIAGNOSIS — C482 Malignant neoplasm of peritoneum, unspecified: Secondary | ICD-10-CM

## 2014-10-09 DIAGNOSIS — R5383 Other fatigue: Secondary | ICD-10-CM

## 2014-10-13 ENCOUNTER — Other Ambulatory Visit (HOSPITAL_BASED_OUTPATIENT_CLINIC_OR_DEPARTMENT_OTHER): Payer: Medicare Other

## 2014-10-13 ENCOUNTER — Ambulatory Visit (HOSPITAL_BASED_OUTPATIENT_CLINIC_OR_DEPARTMENT_OTHER): Payer: Medicare Other | Admitting: Oncology

## 2014-10-13 ENCOUNTER — Ambulatory Visit (HOSPITAL_BASED_OUTPATIENT_CLINIC_OR_DEPARTMENT_OTHER): Payer: Medicare Other

## 2014-10-13 ENCOUNTER — Encounter: Payer: Self-pay | Admitting: Oncology

## 2014-10-13 VITALS — BP 117/48 | HR 61 | Temp 98.2°F | Resp 20 | Ht 61.5 in | Wt 192.8 lb

## 2014-10-13 DIAGNOSIS — C482 Malignant neoplasm of peritoneum, unspecified: Secondary | ICD-10-CM

## 2014-10-13 DIAGNOSIS — C569 Malignant neoplasm of unspecified ovary: Secondary | ICD-10-CM | POA: Diagnosis present

## 2014-10-13 DIAGNOSIS — Z79899 Other long term (current) drug therapy: Secondary | ICD-10-CM

## 2014-10-13 DIAGNOSIS — R161 Splenomegaly, not elsewhere classified: Secondary | ICD-10-CM | POA: Diagnosis not present

## 2014-10-13 DIAGNOSIS — D500 Iron deficiency anemia secondary to blood loss (chronic): Secondary | ICD-10-CM

## 2014-10-13 DIAGNOSIS — R5383 Other fatigue: Secondary | ICD-10-CM

## 2014-10-13 DIAGNOSIS — Z95828 Presence of other vascular implants and grafts: Secondary | ICD-10-CM

## 2014-10-13 DIAGNOSIS — D509 Iron deficiency anemia, unspecified: Secondary | ICD-10-CM

## 2014-10-13 DIAGNOSIS — K7581 Nonalcoholic steatohepatitis (NASH): Secondary | ICD-10-CM

## 2014-10-13 DIAGNOSIS — C786 Secondary malignant neoplasm of retroperitoneum and peritoneum: Secondary | ICD-10-CM | POA: Diagnosis not present

## 2014-10-13 DIAGNOSIS — K7460 Unspecified cirrhosis of liver: Secondary | ICD-10-CM

## 2014-10-13 LAB — COMPREHENSIVE METABOLIC PANEL (CC13)
ALT: 59 U/L — ABNORMAL HIGH (ref 0–55)
AST: 40 U/L — ABNORMAL HIGH (ref 5–34)
Albumin: 3.8 g/dL (ref 3.5–5.0)
Alkaline Phosphatase: 107 U/L (ref 40–150)
Anion Gap: 9 mEq/L (ref 3–11)
BUN: 13.7 mg/dL (ref 7.0–26.0)
CO2: 24 mEq/L (ref 22–29)
Calcium: 9.2 mg/dL (ref 8.4–10.4)
Chloride: 110 mEq/L — ABNORMAL HIGH (ref 98–109)
Creatinine: 0.8 mg/dL (ref 0.6–1.1)
EGFR: 77 mL/min/{1.73_m2} — ABNORMAL LOW (ref >90)
Glucose: 114 mg/dl (ref 70–140)
Potassium: 4.1 mEq/L (ref 3.5–5.1)
Sodium: 143 mEq/L (ref 136–145)
Total Bilirubin: 0.48 mg/dL (ref 0.20–1.20)
Total Protein: 6.9 g/dL (ref 6.4–8.3)

## 2014-10-13 LAB — CBC WITH DIFFERENTIAL/PLATELET
BASO%: 0.2 % (ref 0.0–2.0)
Basophils Absolute: 0 10*3/uL (ref 0.0–0.1)
EOS%: 2.6 % (ref 0.0–7.0)
Eosinophils Absolute: 0.1 10*3/uL (ref 0.0–0.5)
HCT: 38.1 % (ref 34.8–46.6)
HGB: 12.5 g/dL (ref 11.6–15.9)
LYMPH%: 29.5 % (ref 14.0–49.7)
MCH: 28.5 pg (ref 25.1–34.0)
MCHC: 32.8 g/dL (ref 31.5–36.0)
MCV: 87 fL (ref 79.5–101.0)
MONO#: 0.2 10*3/uL (ref 0.1–0.9)
MONO%: 4.1 % (ref 0.0–14.0)
NEUT#: 3 10*3/uL (ref 1.5–6.5)
NEUT%: 63.6 % (ref 38.4–76.8)
Platelets: 121 10*3/uL — ABNORMAL LOW (ref 145–400)
RBC: 4.38 10*6/uL (ref 3.70–5.45)
RDW: 16.3 % — ABNORMAL HIGH (ref 11.2–14.5)
WBC: 4.7 10*3/uL (ref 3.9–10.3)
lymph#: 1.4 10*3/uL (ref 0.9–3.3)

## 2014-10-13 LAB — IRON AND TIBC CHCC
%SAT: 14 % — ABNORMAL LOW (ref 21–57)
Iron: 58 ug/dL (ref 41–142)
TIBC: 409 ug/dL (ref 236–444)
UIBC: 350 ug/dL (ref 120–384)

## 2014-10-13 LAB — TSH CHCC: TSH: 1.119 m(IU)/L (ref 0.308–3.960)

## 2014-10-13 LAB — FERRITIN CHCC: Ferritin: 23 ng/ml (ref 9–269)

## 2014-10-13 MED ORDER — HEPARIN SOD (PORK) LOCK FLUSH 100 UNIT/ML IV SOLN
500.0000 [IU] | Freq: Once | INTRAVENOUS | Status: AC
  Administered 2014-10-13: 500 [IU] via INTRAVENOUS
  Filled 2014-10-13: qty 5

## 2014-10-13 MED ORDER — SODIUM CHLORIDE 0.9 % IJ SOLN
10.0000 mL | INTRAMUSCULAR | Status: DC | PRN
  Administered 2014-10-13: 10 mL via INTRAVENOUS
  Filled 2014-10-13: qty 10

## 2014-10-13 MED ORDER — OXYCODONE HCL 5 MG PO TABS: ORAL_TABLET | ORAL | Status: DC

## 2014-10-13 NOTE — Patient Instructions (Signed)

## 2014-10-13 NOTE — Progress Notes (Signed)
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 Last imaging was CT AP 07-04-14; she is to see Dr Denman George next on 11-21-14. CA 125 was 16 on 08-18-14 and resulted at 14 after visit today.  Patient has gradually felt stronger since off of chemotherapy. She is tolerating more activity, with FitBit reminding her to walk every hour and recording up to 11 miles/day. She is getting 5-6 hours of interrupted sleep/ night since starting Mirapex for restless legs from Dr Tat, this an improvement from previous. 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 Appetite is better, bowels moving regularly. She does not tolerate oral iron well, has been off of this for ~ a week due to nausea. No new or different abdominal or pelvic pain. No bleeding. No increased SOB.     PAC flushed today Genetics testing from 02-2014 normal ([employer] panel) CA 125 1195 at diagnosis. ER + on cytology 11-18-13  ONCOLOGIC HISTORY  Patient initially had problems in ~ July 2015 with stress urinary incontinence related to severe chronic cough. Urine culture by PCP showed no infection and she was referred to Dr Clois Comber, who adjusted medications with great improvement in the cough. Despite improved cough, she continued with some urinary incontinence. She saw Dr Ronita Hipps in 10-2013, with pelvic mass on exam and CA 125 1195 on 11-08-13. She had CT AP 11-12-13, which showed large ascites, irregular and shrunken liver, mild splenomegaly, increased soft tissue in  pelvis bilaterally and anterior mid and lower peritoneum, small left pleural effusion. She saw Dr Denman George in consultation on 11-15-13, who agreed that this was likely gyn malignancy. She had US paracentesis of 4.1 liters on 11-18-13, with cytology ([id number]) and immunohistochemical stains consistent with serous carcinoma of Mullerian tract primary, favor ovarian, ER patchy positive. She had second paracentesis for 3.6 liters on 12-03-13. Gyn oncology recommended initial treatment with chemotherapy. She had first carboplatin and taxol on 12-07-13, with carboplatin dosed at AUC=4 and taxol at 135 mg/m2 due to concern for cirrhosis. She has needed gCSF support and even so was admitted with febrile neutropenia after cycle 3. CA125 was 1938 on 12-01-13 as baseline for chemotherapy, down to 130 prior to cycle 4 and 45 after cycle 5. 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 Chemo resumed 04-19-14, regimen changed to dose dense carbo taxol with granix days 2,3,9,16. She tolerated the dose dense regimen better overall, tho cycle 3 days 8 and 15 were held with neutropenia, symptomatic anemia and fatigue. CT AP 07-04-14 had no obvious recurrent or persistent cancer.   Review of systems as above, also: No fever or symptoms of infection. Trying to improve diet. No LE swelling. No dysuria. No problems with PAC. Recent laser procedure bilaterally for narrow angle glaucoma. Remainder of 10 point Review of Systems negative.  Objective:  Vital signs in last 24 hours:  BP 117/48 mmHg  Pulse 61  Temp(Src) 98.2 F (36.8 C) (Oral)  Resp 20  Ht 5' 1.5" (1.562 m)  Wt 192 lb 12.8 oz (87.454 kg)  BMI 35.84 kg/m2  SpO2 99% Weight up 1 lb. Alert, oriented and appropriate. Ambulatory without assistance.  Hair is growing back.   HEENT:PERRL, sclerae not  icteric. Oral mucosa moist without lesions, posterior pharynx clear.  Neck supple. No JVD.  Lymphatics:no cervical,supraclavicular, axillary or inguinal adenopathy Resp: clear to auscultation bilaterally and normal percussion bilaterally. Respirations not labored RA, no cough. Cardio: regular rate and rhythm. No gallop. GI: abdomen obese, soft, nontender, not distended, no mass or organomegaly. Normally active bowel sounds. Surgical incision not remarkable. Musculoskeletal/ Extremities: without pitting edema, cords, tenderness Neuro: no restless legs obvious during visit. PSYCH brighter mood and affect Skin without rash, ecchymosis, petechiae Breasts: bilaterally with well healed scars from plastic surgery, without dominant mass, skin or nipple findings. Axillae benign. Portacath-without erythema or tenderness  Lab Results:  Results for orders placed or performed in visit on 10/13/14  CBC with Differential  Result Value Ref Range   WBC 4.7 3.9 - 10.3 10e3/uL   NEUT# 3.0 1.5 - 6.5 10e3/uL   HGB 12.5 11.6 - 15.9 g/dL   HCT 38.1 34.8 - 46.6 %   Platelets 121 (L) 145 - 400 10e3/uL   MCV 87.0 79.5 - 101.0 fL   MCH 28.5 25.1 - 34.0 pg   MCHC 32.8 31.5 - 36.0 g/dL   RBC 4.38 3.70 - 5.45 10e6/uL   RDW 16.3 (H) 11.2 - 14.5 %   lymph# 1.4 0.9 - 3.3 10e3/uL   MONO# 0.2 0.1 - 0.9 10e3/uL   Eosinophils Absolute 0.1 0.0 - 0.5 10e3/uL   Basophils Absolute 0.0 0.0 - 0.1 10e3/uL   NEUT% 63.6 38.4 - 76.8 %   LYMPH% 29.5 14.0 - 49.7 %   MONO% 4.1 0.0 - 14.0 %   EOS% 2.6 0.0 - 7.0 %   BASO% 0.2 0.0 - 2.0 %    CMET available after visit normal with exception of chloride 110, AST 40 and ALT 59 (both ~ stable)  Serum iron up to 58, %sat 14, ferritin 23  CA 125 resulted after visit at 14  TSH resulted after visit 1.119  Studies/Results:  No results found.  Medications: I have reviewed the patient's current medications. We will be in touch re iron studies, will recommend MVI with iron daily  for now. Continue Mirapex per Dr Carles Collet. Oxycodone refilled 34m which she uses prn at hs for back pain and chemo related peripheral neuropathy.  DISCUSSION : clinically improving tho PS not back to baseline yet. Encouraged progressive activity, which she prefers to do herself rather than with trainer at gym or PT etc. She [place], this information sent to [employer] for consideration when enrollment resumes.   Assessment/Plan:  1.high grade serous primary peritoneal carcinoma: Neoadjuvant chemotherapy with carboplatin and taxol 12-07-13 thru cycle 5 03-08-14, with neulasta. Interval optimal debulking of what was still large amount of disease, with residual TNTC <313mtumor areas. Additional chemo given x 3 cycles after debulking, thru 05-18-14-07complicated by cytopenias and neuropathy. NED by restaging in late May and marker still in good low range today, tho high risk for recurrence. She will have PAC flush with labs with's  Dr RoSerita Gritppointment  in Oct and I will see her coordinating with PAC flush + labs early Dec. GOG 0225 recommended.  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 Liver function tests stable thru  chemo.Known to Dr Lucio Edward. 3.PAC in: will need to flush every 6-8 weeks if not being used. Keep due to very poor peripheral access and high risk for recurrent cancer. 4.diabetes on oral agent, does not follow blood sugars regularly at home.  5.stress urinary incontinence no worse 6.chronic cough: essentially resolved with medications by Dr [name]. Past tobacco 7. 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 Tapered somewhat but intolerable hot flashes when lower than 1/2 tab daily  11.narrow angle glaucoma: following with  ophthalmology 12.restless legs: severe, chronically interferes with sleep. Mirapex helping somewhat, Dr Tat also felt this related to iron deficiency.  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 I do not believe she has been back to orthopedist recently.  15.obesity with BMI 35.9: needs to lose to ideal. Discussed activity, diet, GOG 0225.  All questions answered, patient in agreement with recommendations and plans. Time spent 25 min including >50% counseling and coordination of care.   LIVESAY,LENNIS P, MD   10/13/2014, 8:46 AM

## 2014-10-14 ENCOUNTER — Other Ambulatory Visit: Payer: Self-pay | Admitting: Oncology

## 2014-10-14 ENCOUNTER — Telehealth: Payer: Self-pay

## 2014-10-14 LAB — CA 125: CA 125: 14 U/mL (ref <35)

## 2014-10-14 NOTE — Telephone Encounter (Signed)
-----   Message from Gordy Levan, MD sent at 10/14/2014 10:34 AM EDT ----- Labs seen and need follow up: please let her know CA 125 still in good low range (she may see it on mychart)

## 2014-10-14 NOTE — Telephone Encounter (Signed)
Discussed the information noted below by Dr. Marko Plume wih Ms. [name].  She will begin a Multivitamin with iron daily.

## 2014-10-14 NOTE — Telephone Encounter (Signed)
Second message on this one, fine to let her know in next week or so   Iron a little better, just barely in normal range. Wonder if she could try a multivitamin with iron daily - amount of iron is much less in this than regular iron tabs, but would help and might be able to tolerate much better    thanks

## 2014-11-21 ENCOUNTER — Telehealth: Payer: Self-pay | Admitting: Oncology

## 2014-11-21 ENCOUNTER — Ambulatory Visit (HOSPITAL_BASED_OUTPATIENT_CLINIC_OR_DEPARTMENT_OTHER): Payer: Medicare Other

## 2014-11-21 ENCOUNTER — Encounter: Payer: Self-pay | Admitting: Gynecologic Oncology

## 2014-11-21 ENCOUNTER — Ambulatory Visit: Payer: Medicare Other | Attending: Gynecologic Oncology | Admitting: Gynecologic Oncology

## 2014-11-21 ENCOUNTER — Ambulatory Visit: Payer: Medicare Other

## 2014-11-21 ENCOUNTER — Other Ambulatory Visit: Payer: Self-pay

## 2014-11-21 VITALS — BP 135/55 | HR 57 | Temp 98.0°F | Resp 18 | Ht 61.5 in | Wt 189.9 lb

## 2014-11-21 DIAGNOSIS — G5790 Unspecified mononeuropathy of unspecified lower limb: Secondary | ICD-10-CM | POA: Insufficient documentation

## 2014-11-21 DIAGNOSIS — C482 Malignant neoplasm of peritoneum, unspecified: Secondary | ICD-10-CM | POA: Diagnosis present

## 2014-11-21 DIAGNOSIS — C569 Malignant neoplasm of unspecified ovary: Secondary | ICD-10-CM

## 2014-11-21 DIAGNOSIS — M792 Neuralgia and neuritis, unspecified: Secondary | ICD-10-CM

## 2014-11-21 DIAGNOSIS — Z95828 Presence of other vascular implants and grafts: Secondary | ICD-10-CM

## 2014-11-21 LAB — COMPREHENSIVE METABOLIC PANEL (CC13)
ALT: 72 U/L — ABNORMAL HIGH (ref 0–55)
AST: 41 U/L — ABNORMAL HIGH (ref 5–34)
Albumin: 3.8 g/dL (ref 3.5–5.0)
Alkaline Phosphatase: 112 U/L (ref 40–150)
Anion Gap: 8 mEq/L (ref 3–11)
BUN: 15.5 mg/dL (ref 7.0–26.0)
CO2: 25 mEq/L (ref 22–29)
Calcium: 9.3 mg/dL (ref 8.4–10.4)
Chloride: 107 mEq/L (ref 98–109)
Creatinine: 0.8 mg/dL (ref 0.6–1.1)
EGFR: 75 mL/min/{1.73_m2} — ABNORMAL LOW (ref >90)
Glucose: 183 mg/dl — ABNORMAL HIGH (ref 70–140)
Potassium: 3.8 mEq/L (ref 3.5–5.1)
Sodium: 140 mEq/L (ref 136–145)
Total Bilirubin: 0.41 mg/dL (ref 0.20–1.20)
Total Protein: 6.8 g/dL (ref 6.4–8.3)

## 2014-11-21 LAB — CBC WITH DIFFERENTIAL/PLATELET
BASO%: 0.4 % (ref 0.0–2.0)
Basophils Absolute: 0 10*3/uL (ref 0.0–0.1)
EOS%: 2 % (ref 0.0–7.0)
Eosinophils Absolute: 0.1 10*3/uL (ref 0.0–0.5)
HCT: 38.3 % (ref 34.8–46.6)
HGB: 12.3 g/dL (ref 11.6–15.9)
LYMPH%: 26 % (ref 14.0–49.7)
MCH: 28.2 pg (ref 25.1–34.0)
MCHC: 32.2 g/dL (ref 31.5–36.0)
MCV: 87.6 fL (ref 79.5–101.0)
MONO#: 0.2 10*3/uL (ref 0.1–0.9)
MONO%: 4.3 % (ref 0.0–14.0)
NEUT#: 3.1 10*3/uL (ref 1.5–6.5)
NEUT%: 67.3 % (ref 38.4–76.8)
Platelets: 119 10*3/uL — ABNORMAL LOW (ref 145–400)
RBC: 4.37 10*6/uL (ref 3.70–5.45)
RDW: 17.6 % — ABNORMAL HIGH (ref 11.2–14.5)
WBC: 4.7 10*3/uL (ref 3.9–10.3)
lymph#: 1.2 10*3/uL (ref 0.9–3.3)

## 2014-11-21 MED ORDER — OXYCODONE HCL 5 MG PO TABS: ORAL_TABLET | ORAL | Status: DC

## 2014-11-21 MED ORDER — HEPARIN SOD (PORK) LOCK FLUSH 100 UNIT/ML IV SOLN
500.0000 [IU] | Freq: Once | INTRAVENOUS | Status: AC
  Administered 2014-11-21: 500 [IU] via INTRAVENOUS
  Filled 2014-11-21: qty 5

## 2014-11-21 MED ORDER — ESTRADIOL 1 MG PO TABS: 1.0000 mg | ORAL_TABLET | ORAL | Status: DC

## 2014-11-21 MED ORDER — SODIUM CHLORIDE 0.9 % IJ SOLN
10.0000 mL | INTRAMUSCULAR | Status: DC | PRN
  Administered 2014-11-21: 10 mL via INTRAVENOUS
  Filled 2014-11-21: qty 10

## 2014-11-21 NOTE — Patient Instructions (Signed)

## 2014-11-21 NOTE — Patient Instructions (Signed)
Plan to have your port flushed on Nov 21 then see Dr. Marko Plume 6 weeks after.  Plan to see Dr. Denman George in six months or sooner if needed.  When you come to see Dr. Marko Plume, we can schedule you an appt with Dr. Denman George.  Please call for any questions or concerns.

## 2014-11-21 NOTE — Progress Notes (Signed)
Primary Peritoneal Cancer Followup Note: Gyn-Onc  Consult was initially requested by Dr. Ronita Hipps for the evaluation of Olivia Benson("[name]"), a 69 y.o. female with stage IIIC primary peritoneal cancer  CC: Dr Brien Few Chief Complaint  Patient presents with  . cancer followup    primary peritoneal    Assessment/Plan:  [name] Olivia Benson") is a 69 y.o.  year old G1 with stage III primary peritoneal cancer now NED (no evidence of disease) after completing adjuvant therapy 6 months ago (in May, 2016).  Continue surveillance phase with examinations every 3 months with CA 125 assessments, pelvic examinations and symptom assessments (alternating these visits with Dr Marko Plume and myself). Port flushes every 6 weeks. I reviewed symptoms to note as signs of recurrence.  HPI: Olivia Benson is a 69 year old G1P1 who was seen in consultation at the request of Dr Ronita Hipps in October 2015 for the findings of a pelvic mass on examination, CT findings consistent with ovarian cancer and an elevated CA 125. Olivia Benson had approximately 1 year history of cough, for which she underwent workup with Dr [name], a pulmonologist at [facility]. With extensive evaluation and medication modification, she feels "a million percent better". However, she continued to have stress urinary incontinence ( a symptom she has had for approximately 2 years). She had initially felt the SUI was associated with the cough, however, when she noticed that it did not improve with resolution of her cough, she sought further evaluation.  In September 2015 she was seen by her OBGYN Dr Ronita Hipps for the persistent SUI symptoms. He performed a pelvic examination which revealed a pelvic mass. He ordered a CA 125: 1195 on 11/08/13; and a CT of the abdomen and pelvis on 11/12/13 which showed:  - A large amount of ascites present.  - Findings of progressive hepatic cirrhosis with a large amount of ascites and mild splenomegaly. There are venous collaterals  present.The liver is shrunken and its surface is markedly irregular with mild splenomegaly. There prominence of mesenteric venous structures.   - The urinary bladder is partially distended and grossly normal.  - Increased peritoneal soft tissue density within the pelvis fairly symmetric from right to left. This could reflect mesenteric implants in the appropriate clinical setting. There are similar findings noted along the anterior mid and lower peritoneal surface on the left.  - No definite focal adnexal masses are demonstrated.  - There is a small left pleural effusion layering posteriorly.     The patient has had a hysterectomy for benign indications in 1988 ([facility]).  She has a sister, a maternal aunt and a maternal cousin with breast cancer (none have been tested for BRCA).  Of note, in 2013 she had undergone a CT to work up abnormal LFT's. This showed an enlarged irregularly contoured liver and splenomegaly consistent with cirrhosis. A cirrhosis workup was unremarkable and her LFT's trended down, and therefore no further action was taken. She denies alcoholism, and rarely drinks.  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  She went on to receive 5 cycles of neoadjuvant chemotherapy with paclitaxel and carboplatin q 21 days. She has tolerated therapy very well with no major dose limiting toxicities or treatment delays. Her most recent CA 125 on day 1 of cycle 4 was 130. She had required paracenteses x 3 during her first cycles of therapy to control ascites symptoms.  CT after cycle 4 (02/23/14):  1. Interval decrease in ascites. 2. Morphologic changes in the liver consistent with cirrhosis. 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 Portal vein remains patent at this time. 3. Persistent but decreased abnormal soft tissue  attenuation tracking in the omental fat. This may be secondary to interval improvement in metastatic disease. 4. Peritoneal thickening seen in the left abdomen and pelvis on the previous study has decreased and nearly resolved in the interval. This also suggests interval improvement in metastatic disease.  On April 08 2014 she underwent an exploratory laparotomy with BSO and radical tumor debulking. Intraoperative findings included a 10 cm omental cake, miliary studding of tumor implants on the small intestine measuring less than 3 mm each, small-volume ascites, small ovaries bilaterally with no apparent primary ovarian tumor, cirrhotic liver with hepatomegaly and splenomegaly. She underwent an optimal cytoreductive surgery and recovered in an uncomplicated fashion from surgery.  She went on to receive an additional 3 cycles of dose dense paclitaxel and carboplatin adjuvant chemotherapy completed in May 2016. She had dose delays secondary to bone marrow toxicity.  CA 125 drawn on her last cycle of chemotherapy in May 2016 was normal at 19.  CT scan of the chest, abdomen and pelvis on 07/04/14 after her last chemotherapy cycle showed: a complete radiographic response with cirrhosis of the liver with splenomegaly and gastric varices.  Interval History: She has been NED and doing well. She was last seen by Dr Marko Plume in September, 2016 with a normal CA 125 of 14.   Current Meds:  Outpatient Encounter Prescriptions as of 11/21/2014  Medication Sig  . albuterol (PROVENTIL HFA;VENTOLIN HFA) 108 (90 BASE) MCG/ACT inhaler Inhale 2 puffs into the lungs every 4 (four) hours as needed for wheezing or shortness of breath.   Marland Kitchen amLODipine (NORVASC) 2.5 MG tablet Take 2.5 mg by mouth daily.  . bisoprolol (ZEBETA) 5 MG tablet Take 1/2 tablet daily (Patient taking differently: Take 2.5 mg by mouth every morning. )  . estradiol (ESTRACE) 1 MG tablet Take 1 mg by mouth every other day.   . Ferrous Fumarate  (HEMOCYTE) 324 (106 FE) MG TABS Take 1 tablet by mouth daily. Take on an empty stomach with a glass of OJ or vitamin C tablet. (Patient not taking: Reported on 10/13/2014)  . ibuprofen (ADVIL,MOTRIN) 200 MG tablet Take 800 mg by mouth every 6 (six) hours as needed for moderate pain (pain).  Marland Kitchen lidocaine-prilocaine (EMLA) cream Apply 1 application topically as needed. Apply 1-2 hours prior to [facility]  access as directed.  . metFORMIN (GLUCOPHAGE-XR) 500 MG 24 hr tablet Take 500 mg by mouth 2 (two) times daily.   Marland Kitchen oxyCODONE (OXY IR/ROXICODONE) 5 MG immediate release tablet Take 1-2 tablets by mouth every 4-6 hours as needed for pain.  . pantoprazole (PROTONIX) 40 MG tablet Take 1 tablet (40 mg total) by mouth 2 (two) times daily.  . pramipexole (MIRAPEX) 0.125 MG tablet Take 1 tablet at dinner, 2 tablets at night  . [DISCONTINUED] sodium chloride 0.9 % injection 10 mL    No facility-administered encounter medications on file as of 11/21/2014.    Allergy:  Allergies  Allergen Reactions  . Tape Itching    Red itch with some tapes  . Benadryl [Diphenhydramine Hcl] Other (See Comments)    Restless legs   . Shellfish Allergy     HER THROAT SWELLS UP AND CLOSES  . Penicillins Rash    WHEN SHE WAS TEENAGER    Social Hx:  Social History   Social History  . Marital Status: Single    Spouse Name: N/A  . Number of Children: 1 D  . Years of Education: N/A   Occupational History  . Retired    Social History Main Topics  . Smoking status: Former Smoker -- 1.00 packs/day for 1 years    Types: Cigarettes    Quit date: 02/11/1982  . Smokeless tobacco: Current User  . Alcohol Use: No  . Drug Use: No  . Sexual Activity: Not on file   Other Topics Concern  . Not on file   Social History Narrative  former smoker (quit in the 51's) Used to work teaching bar tending. Remote history of heavier drinking but "never a problem". She now drinks once every few months.  Past Surgical Hx:   Past Surgical History  Procedure Laterality Date  . Tonsillectomy  1971  . Abdominal ultrasound  Sept 2012    No gallbladder disease, + fatty liver  . Cardiac catheterization  10/25/2010    EF 60%; Normal coronaries  . Colonoscopy  10/18/2005  . Wh-mammography  11/24/2009  . Partial hysterectomy  1988  . Cosmetic surgery  2003  . Tubal ligation    . Dental surgery    . Salpingoophorectomy Bilateral 03/29/2014    Procedure: BILATERAL SALPINGO OOPHORECTOMY, OMENTECTOMY, DEBULKING;  Surgeon: Everitt Amber, MD;  Location: [facility];  Service: Gynecology;  Laterality: Bilateral;  . Laparotomy N/A 03/29/2014    Procedure: EXPLORATORY LAPAROTOMY;  Surgeon: Everitt Amber, MD;  Location: [facility];  Service: Gynecology;  Laterality: N/A;  . 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 Diabetes mellitus   . Asthma   . Overactive bladder   . Elevated LFTs   . Obesity   . Hypertension   . Cirrhosis ([place])   . Portal hypertension ([place])   . Glaucoma, narrow-angle   . Ovarian cancer ([place]) 11/22/2013    Disseminated ovarian cancer  . Complication of anesthesia     slow to wake up / n & v  . PONV (postoperative nausea and vomiting)   . Family history of adverse reaction to anesthesia     multiple family members have had problems such as slow to wake / "flat lined" /  ventilator    . Non-ST elevated myocardial infarction (non-STEMI) [facility]) Sept 2012    Mild elevation in troponin and CK MB but no evidence of coronary disease at time of cath  . Tingling     hands and feet  . History of skin cancer   . Frequency of urination   . Restless leg syndrome     Past Gynecological History:  Hysterectomy for heavy periods (benign) in 1988.  No LMP recorded. Patient has had a hysterectomy.  Family Hx:  Family History  Problem Relation Age of Onset  . Asthma Mother   . Diabetes Mother   .  Hypertension Mother   . Allergies Mother   . Rheum arthritis Mother   . Bladder Cancer Father 61    former heavy smoker  . Hypertension Father   . Stroke Father   . Breast cancer Sister 65  . Diabetes Brother   . Allergies Brother   . Asthma Brother   . Diabetes Sister     x 3  . Allergies Sister   . Asthma  Sister     x 2   . Breast cancer Maternal Aunt 85  . Breast cancer Cousin 49    (maternal) bilateral cancer  . Aneurysm Son 36    Review of Systems:  Constitutional  Feels well,  See HPI  ENT Normal appearing ears and nares bilaterally Skin/Breast  No rash, sores, jaundice, itching, dryness Cardiovascular  No chest pain, shortness of breath, or edema  Pulmonary  No cough or wheeze.  Gastro Intestinal  No nausea, vomitting, or diarrhoea. No bright red blood per rectum, no abdominal pain, change in bowel movement, or constipation. Incision healed Genito Urinary  No frequency, urgency, dysuria, + stress incontinence.  Musculo Skeletal  No deformities Neurologic  No weakness, numbness, change in gait,  Psychology  No depression, anxiety, insomnia.   Vitals:  There were no vitals taken for this visit.  Physical Exam: WD in NAD Neck  Supple NROM, without any enlargements.  Lymph Node Survey No cervical supraclavicular or inguinal adenopathy Cardiovascular  Pulse normal rate, regularity and rhythm. S1 and S2 normal.  Lungs  Clear to auscultation bilateraly, without wheezes/crackles/rhonchi. Good air movement.  Skin  Erythema and edema on distal right arm  Psychiatry  Alert and oriented to person, place, and time  Abdomen  Normoactive bowel sounds, abdomen soft, non-tender and overweight without evidence of hernia. No dullness to percussion. Incision healed Back No CVA tenderness  Genito Urinary  Vulva/vagina: Normal external female genitalia.  No lesions. No discharge or bleeding.  Bladder/urethra:  No lesions or masses, well supported bladder  Vagina:  normal in appearance  Cervix: surgically absent  Uterus: surgically absent   Adnexa: no fullness or masses appreciated. Rectal  deferred Extremities  No bilateral cyanosis, clubbing or edema.   Donaciano Eva, MD   11/21/2014, 10:39 AM

## 2014-11-21 NOTE — Progress Notes (Signed)
Patient has undergone genetic testing for BRCA which was negative for a deleterious mutation.

## 2014-11-21 NOTE — Telephone Encounter (Signed)
Appointments made per [facility] c with dr Denman George for follow up   Olivia Benson

## 2014-11-22 LAB — CA 125: CA 125: 16 U/mL (ref <35)

## 2014-11-25 ENCOUNTER — Encounter: Payer: Self-pay | Admitting: Neurology

## 2014-11-25 ENCOUNTER — Ambulatory Visit (INDEPENDENT_AMBULATORY_CARE_PROVIDER_SITE_OTHER): Payer: Medicare Other | Admitting: Neurology

## 2014-11-25 VITALS — BP 100/68 | HR 69 | Ht 61.5 in | Wt 195.0 lb

## 2014-11-25 DIAGNOSIS — D509 Iron deficiency anemia, unspecified: Secondary | ICD-10-CM | POA: Diagnosis not present

## 2014-11-25 DIAGNOSIS — G2581 Restless legs syndrome: Secondary | ICD-10-CM

## 2014-11-25 NOTE — Progress Notes (Signed)
Olivia Benson was seen today in neurologic consultation at the request of Dr. Marko Plume. Her PCP is Helane Rima, MD.  The consultation is for RLS.  Prior records were reviewed.  The patient also has a history of primary peritoneal carcinomatosis, grade 3.  She has undergone previous debulking surgery and chemotherapy with carboplatin and Taxol, which was last completed on 05/30/2014.  Her chemotherapy has been complicated by neutropenia, peripheral neuropathy, symptomatically anemia and fatigue.  She presents today to discuss restless leg syndrome.  Pt states that her sx's consist of a crawling sensation on the legs; she feels that she has a spider web on the leg; "I just have to move."  It may have started pre-chemo but got much worse after chemo.  It starts mid afternoon once she sits and "gets quiet" and tries to watch TV.    She has previously tried flexeril, baclofen, ultram, restoril and oxycontin.  The only one that has been helpful is oxyIR.  She has never been on requip, mirapex, levodopa, klonopin.    She admits that she craves ice.  She has been anemic.  Her last Hgb was 10.6.    11/25/14 update:  The patient is following up today in regards to her restless leg syndrome that is likely secondary to iron deficiency anemia.  Last visit, I started her on pramipexole, 0.125 mg after dinner and 0.25 mg at night. When she wakes up at night, she has "bone pain" but no RLS sx's.  She is still on one oxycodone at night for the pain but has dropped that from 2 tablets.   I was able to get a copy of her TSH from January, 2015 and it was normal at 1.820.  Dr. Marko Plume did start her on oral iron after our last visit but unfortunately the patient did not tolerate that because of nausea and it was recommended that the patient try instead a multivitamin with iron and she couldn't tolerate that either.  Said that she actually tolerated oral iron better than MVI.  Said that her ice craving went  away.   PREVIOUS MEDICATIONS: n/a  ALLERGIES:   Allergies  Allergen Reactions  . Tape Itching    Red itch with some tapes  . Benadryl [Diphenhydramine Hcl] Other (See Comments)    Restless legs   . Shellfish Allergy     HER THROAT SWELLS UP AND CLOSES  . Penicillins Rash    WHEN SHE WAS TEENAGER    CURRENT MEDICATIONS:  Outpatient Encounter Prescriptions as of 11/25/2014  Medication Sig  . albuterol (PROVENTIL HFA;VENTOLIN HFA) 108 (90 BASE) MCG/ACT inhaler Inhale 2 puffs into the lungs every 4 (four) hours as needed for wheezing or shortness of breath.   Marland Kitchen amLODipine (NORVASC) 2.5 MG tablet Take 2.5 mg by mouth daily.  . bisoprolol (ZEBETA) 5 MG tablet Take 1/2 tablet daily (Patient taking differently: Take 2.5 mg by mouth every morning. )  . estradiol (ESTRACE) 1 MG tablet Take 1 tablet (1 mg total) by mouth every other day.  . ibuprofen (ADVIL,MOTRIN) 200 MG tablet Take 800 mg by mouth every 6 (six) hours as needed for moderate pain (pain).  Marland Kitchen lidocaine-prilocaine (EMLA) cream Apply 1 application topically as needed. Apply 1-2 hours prior to [facility]  access as directed.  . metFORMIN (GLUCOPHAGE-XR) 500 MG 24 hr tablet Take 500 mg by mouth 2 (two) times daily.   Marland Kitchen oxyCODONE (OXY IR/ROXICODONE) 5 MG immediate release tablet Take 1-2 tablets by mouth  every 4-6 hours as needed for pain.  . pantoprazole (PROTONIX) 40 MG tablet Take 1 tablet (40 mg total) by mouth 2 (two) times daily.  . pramipexole (MIRAPEX) 0.125 MG tablet Take 1 tablet at dinner, 2 tablets at night  . [DISCONTINUED] Ferrous Fumarate (HEMOCYTE) 324 (106 FE) MG TABS Take 1 tablet by mouth daily. Take on an empty stomach with a glass of OJ or vitamin C tablet. (Patient not taking: Reported on 10/13/2014)   No facility-administered encounter medications on file as of 11/25/2014.    PAST MEDICAL HISTORY:   Past Medical History  Diagnosis Date  . Diabetes mellitus   . Asthma   . Overactive bladder   . Elevated  LFTs   . Obesity   . Hypertension   . Cirrhosis ([place])   . Portal hypertension ([place])   . Glaucoma, narrow-angle   . Ovarian cancer ([place]) 11/22/2013    Disseminated ovarian cancer  . Complication of anesthesia     slow to wake up / n & v  . PONV (postoperative nausea and vomiting)   . Family history of adverse reaction to anesthesia     multiple family members have had problems such as slow to wake / "flat lined" /  ventilator    . Non-ST elevated myocardial infarction (non-STEMI) [facility]) Sept 2012    Mild elevation in troponin and CK MB but no evidence of coronary disease at time of cath  . Tingling     hands and feet  . History of skin cancer   . Frequency of urination   . Restless leg syndrome     PAST SURGICAL HISTORY:   Past Surgical History  Procedure Laterality Date  . Tonsillectomy  1971  . Abdominal ultrasound  Sept 2012    No gallbladder disease, + fatty liver  . Cardiac catheterization  10/25/2010    EF 60%; Normal coronaries  . Colonoscopy  10/18/2005  . Wh-mammography  11/24/2009  . Partial hysterectomy  1988  . Cosmetic surgery  2003  . Tubal ligation    . Dental surgery    . Salpingoophorectomy Bilateral 03/29/2014    Procedure: BILATERAL SALPINGO OOPHORECTOMY, OMENTECTOMY, DEBULKING;  Surgeon: Everitt Amber, MD;  Location: [facility];  Service: Gynecology;  Laterality: Bilateral;  . Laparotomy N/A 03/29/2014    Procedure: EXPLORATORY LAPAROTOMY;  Surgeon: Everitt Amber, MD;  Location: [facility];  Service: Gynecology;  Laterality: N/A;  . 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 Marital Status: Single    Spouse Name: N/A  . Number of Children: 1 D  . Years of Education: N/A   Occupational History  . Retired    Social History Main Topics  . Smoking status: Former Smoker -- 1.00 packs/day for 1 years    Types: Cigarettes    Quit date: 02/11/1982  .  Smokeless tobacco: Current User  . Alcohol Use: No  . Drug Use: No  . Sexual Activity: Not on file   Other Topics Concern  . Not on file   Social History Narrative    FAMILY HISTORY:   Family Status  Relation Status Death Age  . Mother Alive     arthritis, htn, DM  . Father Deceased 75    bladder cancer, stroke  . Sister Alive     breast cancer  . Sister Alive  htn  . Sister Alive     htn  . Sister Alive     htn  . Maternal Aunt Alive   . Cousin Alive   . Maternal Uncle Deceased infancy  . Maternal Grandmother Deceased 3    pneumonia  . Maternal Grandfather Deceased   . Paternal Grandmother Deceased   . Paternal Grandfather Deceased   . Maternal Aunt Alive   . Maternal Aunt Alive   . Maternal Aunt Alive   . Son Deceased 48    died of brain anerysm, after a 20 ft fall  . Brother Alive     htn    ROS:  A complete 10 system review of systems was obtained and was unremarkable apart from what is mentioned above.  PHYSICAL EXAMINATION:    VITALS:   Filed Vitals:   11/25/14 1348  BP: 100/68  Pulse: 69  Height: 5' 1.5" (1.562 m)  Weight: 195 lb (88.451 kg)    GEN:  Normal appears female in no acute distress.  Appears stated age. HEENT:  Normocephalic, atraumatic. The mucous membranes are moist. The superficial temporal arteries are without ropiness or tenderness. Cardiovascular: Regular rate and rhythm. Lungs: Clear to auscultation bilaterally. Neck/Heme: There are no carotid bruits noted bilaterally.  NEUROLOGICAL: Orientation:  The patient is alert and oriented x 3.   Cranial nerves: There is good facial symmetry. The pupils are equal round and reactive to light bilaterally. Fundoscopic exam reveals clear disc margins bilaterally. Extraocular muscles are intact and visual fields are full to confrontational testing. Speech is fluent and clear. Soft palate rises symmetrically and there is no tongue deviation. Hearing is intact to conversational tone. Tone:  Tone is good throughout. Sensation: Sensation is intact to light touch throughout Coordination:  The patient has no difficulty with RAM's or FNF bilaterally. Motor: Strength is 5/5 in the bilateral upper and lower extremities.  Shoulder shrug is equal and symmetric. There is no pronator drift.  There are no fasciculations noted. DTR's: Deep tendon reflexes are 1/4 at the bilateral biceps, triceps, brachioradialis, and absent at the bilateral patella and achilles.  Plantar responses are downgoing bilaterally. Gait and Station: The patient is able to ambulate without difficulty. The patient has trouble ambulating in a tandem fashion.  She can walk on her toes and heels.  She sways in the romberg position with eyes closed but is able to stand there.    Lab Results  Component Value Date   WBC 4.7 11/21/2014   HGB 12.3 11/21/2014   HCT 38.3 11/21/2014   MCV 87.6 11/21/2014   PLT 119* 11/21/2014   Lab Results  Component Value Date   TSH 1.119 10/13/2014      IMPRESSION/PLAN  1. RLS, secondar to iron def anemia  -Continue Mirapex 0.125 mg after dinner or when she first sits down in the evening and then 0.25 mg at bedtime.  Risks, benefits, side effects and alternative therapies were discussed.  The opportunity to ask questions was given and they were answered to the best of my ability.  The patient expressed understanding and willingness to follow the outlined treatment protocols.  -encouraged her to at least go back to oral iron if tolerated that better than MVI.  She doesn't want to consider IV iron.  Anemia has improved since I initially saw her and consequently pica has improved. 2.  F/u 6 months, sooner should new issues arise.

## 2014-12-30 ENCOUNTER — Telehealth: Payer: Self-pay | Admitting: Gynecologic Oncology

## 2014-12-30 DIAGNOSIS — C569 Malignant neoplasm of unspecified ovary: Secondary | ICD-10-CM

## 2014-12-30 DIAGNOSIS — M792 Neuralgia and neuritis, unspecified: Secondary | ICD-10-CM

## 2014-12-30 MED ORDER — OXYCODONE HCL 5 MG PO TABS: ORAL_TABLET | ORAL | Status: DC

## 2014-12-30 NOTE — Telephone Encounter (Signed)
Returned call to patient.  Patient had called triage this am requesting a refill on oxycodone which she takes for her leg pain at bedtime.  Spoke with patient about this.  Refill will be left for her with Thayer Headings, RN at the [facility] to be picked up on Monday.  Advised to call for any questions or concerns and to not take the medication and drive.

## 2015-01-02 ENCOUNTER — Ambulatory Visit (HOSPITAL_BASED_OUTPATIENT_CLINIC_OR_DEPARTMENT_OTHER): Payer: Medicare Other

## 2015-01-02 ENCOUNTER — Encounter: Payer: Self-pay | Admitting: *Deleted

## 2015-01-02 VITALS — BP 129/44 | HR 60 | Temp 97.9°F | Resp 17

## 2015-01-02 DIAGNOSIS — Z452 Encounter for adjustment and management of vascular access device: Secondary | ICD-10-CM

## 2015-01-02 DIAGNOSIS — C569 Malignant neoplasm of unspecified ovary: Secondary | ICD-10-CM | POA: Diagnosis not present

## 2015-01-02 DIAGNOSIS — Z95828 Presence of other vascular implants and grafts: Secondary | ICD-10-CM

## 2015-01-02 MED ORDER — SODIUM CHLORIDE 0.9 % IJ SOLN
10.0000 mL | INTRAMUSCULAR | Status: DC | PRN
  Administered 2015-01-02: 10 mL via INTRAVENOUS
  Filled 2015-01-02: qty 10

## 2015-01-02 MED ORDER — HEPARIN SOD (PORK) LOCK FLUSH 100 UNIT/ML IV SOLN
500.0000 [IU] | Freq: Once | INTRAVENOUS | Status: AC
  Administered 2015-01-02: 500 [IU] via INTRAVENOUS
  Filled 2015-01-02: qty 5

## 2015-01-02 NOTE — Progress Notes (Signed)
01/02/2015 Patient in to clinic today for flush appointment. Patient was previously referred by Dr. Marko Plume, as a potential candidate for the GOG 0225 LIvES study. Due to temporary suspension of enrollment to trials at [facility], the window for enrollment of 6 months and 2 weeks maximum following completion of chemotherapy was reached, prior to resuming research study enrollments on 12/13/2014. Explained this to patient today as reason for not being able to enroll her in this study. Patient commented on her weight gain, and it was suggested that the patient could be referred to a dietitian for assistance, to which patient responded "I could probably teach the class", based on the amount of study she has done about nutrition and weight loss. Thanked patient for her interest in the study. Cindy S. Brigitte Pulse BSN, RN, CCRP 01/02/2015 11:37 AM

## 2015-01-02 NOTE — Patient Instructions (Signed)

## 2015-01-31 ENCOUNTER — Telehealth: Payer: Self-pay | Admitting: Oncology

## 2015-01-31 NOTE — Telephone Encounter (Signed)
Spoke to patient and she is aware of her appointment change due to dr Marko Plume out of office

## 2015-02-12 ENCOUNTER — Other Ambulatory Visit: Payer: Self-pay | Admitting: Oncology

## 2015-02-12 DIAGNOSIS — D500 Iron deficiency anemia secondary to blood loss (chronic): Secondary | ICD-10-CM

## 2015-02-12 DIAGNOSIS — C482 Malignant neoplasm of peritoneum, unspecified: Secondary | ICD-10-CM

## 2015-02-15 ENCOUNTER — Telehealth: Payer: Self-pay

## 2015-02-15 NOTE — Telephone Encounter (Signed)
S/w pt and she did get flu shot this year, sept or oct at pcp.

## 2015-02-15 NOTE — Telephone Encounter (Signed)
-----   Message from Gordy Levan, MD sent at 02/12/2015 10:56 PM EST ----- To see me Jan 16  No flu shot in EMR this year.  RN please phone her this week, should get flu shot ASAP any location if not done yet, as flu already in area. She should let dr know if flu symptoms regardless, as vaccine will take a couple of weeks if given now, and tamiflu can help if gets sick even after vaccine  thanks

## 2015-02-21 ENCOUNTER — Other Ambulatory Visit: Payer: Self-pay | Admitting: Neurology

## 2015-02-21 MED ORDER — PRAMIPEXOLE DIHYDROCHLORIDE 0.125 MG PO TABS: ORAL_TABLET | ORAL | Status: DC

## 2015-02-21 NOTE — Telephone Encounter (Signed)
Mirapex refill requested. Per last office note- patient to remain on medication. Refill approved and sent to patient's pharmacy.

## 2015-02-23 ENCOUNTER — Ambulatory Visit: Payer: Medicare Other | Admitting: Oncology

## 2015-02-23 ENCOUNTER — Other Ambulatory Visit: Payer: Medicare Other

## 2015-02-27 ENCOUNTER — Ambulatory Visit (HOSPITAL_BASED_OUTPATIENT_CLINIC_OR_DEPARTMENT_OTHER): Payer: Medicare Other | Admitting: Oncology

## 2015-02-27 ENCOUNTER — Telehealth: Payer: Self-pay | Admitting: Oncology

## 2015-02-27 ENCOUNTER — Other Ambulatory Visit (HOSPITAL_BASED_OUTPATIENT_CLINIC_OR_DEPARTMENT_OTHER): Payer: Medicare Other

## 2015-02-27 ENCOUNTER — Ambulatory Visit (HOSPITAL_BASED_OUTPATIENT_CLINIC_OR_DEPARTMENT_OTHER): Payer: Medicare Other

## 2015-02-27 VITALS — BP 136/61 | HR 65 | Temp 98.7°F | Resp 18 | Ht 61.5 in | Wt 196.9 lb

## 2015-02-27 DIAGNOSIS — C569 Malignant neoplasm of unspecified ovary: Secondary | ICD-10-CM | POA: Diagnosis present

## 2015-02-27 DIAGNOSIS — D500 Iron deficiency anemia secondary to blood loss (chronic): Secondary | ICD-10-CM | POA: Diagnosis not present

## 2015-02-27 DIAGNOSIS — M792 Neuralgia and neuritis, unspecified: Secondary | ICD-10-CM

## 2015-02-27 DIAGNOSIS — G620 Drug-induced polyneuropathy: Secondary | ICD-10-CM

## 2015-02-27 DIAGNOSIS — M545 Low back pain: Secondary | ICD-10-CM | POA: Diagnosis not present

## 2015-02-27 DIAGNOSIS — D509 Iron deficiency anemia, unspecified: Secondary | ICD-10-CM | POA: Diagnosis not present

## 2015-02-27 DIAGNOSIS — C482 Malignant neoplasm of peritoneum, unspecified: Secondary | ICD-10-CM | POA: Diagnosis present

## 2015-02-27 DIAGNOSIS — N951 Menopausal and female climacteric states: Secondary | ICD-10-CM | POA: Diagnosis not present

## 2015-02-27 DIAGNOSIS — Z95828 Presence of other vascular implants and grafts: Secondary | ICD-10-CM

## 2015-02-27 LAB — IRON AND TIBC
%SAT: 11 % — ABNORMAL LOW (ref 21–57)
Iron: 46 ug/dL (ref 41–142)
TIBC: 409 ug/dL (ref 236–444)
UIBC: 363 ug/dL (ref 120–384)

## 2015-02-27 LAB — CBC WITH DIFFERENTIAL/PLATELET
BASO%: 0.2 % (ref 0.0–2.0)
Basophils Absolute: 0 10*3/uL (ref 0.0–0.1)
EOS%: 2.2 % (ref 0.0–7.0)
Eosinophils Absolute: 0.1 10*3/uL (ref 0.0–0.5)
HCT: 37.5 % (ref 34.8–46.6)
HGB: 12.4 g/dL (ref 11.6–15.9)
LYMPH%: 27.4 % (ref 14.0–49.7)
MCH: 29.7 pg (ref 25.1–34.0)
MCHC: 33.1 g/dL (ref 31.5–36.0)
MCV: 89.7 fL (ref 79.5–101.0)
MONO#: 0.3 10*3/uL (ref 0.1–0.9)
MONO%: 6.1 % (ref 0.0–14.0)
NEUT#: 3.2 10*3/uL (ref 1.5–6.5)
NEUT%: 64.1 % (ref 38.4–76.8)
Platelets: 123 10*3/uL — ABNORMAL LOW (ref 145–400)
RBC: 4.18 10*6/uL (ref 3.70–5.45)
RDW: 14.2 % (ref 11.2–14.5)
WBC: 4.9 10*3/uL (ref 3.9–10.3)
lymph#: 1.4 10*3/uL (ref 0.9–3.3)

## 2015-02-27 LAB — COMPREHENSIVE METABOLIC PANEL
ALT: 87 U/L — ABNORMAL HIGH (ref 0–55)
AST: 53 U/L — ABNORMAL HIGH (ref 5–34)
Albumin: 3.6 g/dL (ref 3.5–5.0)
Alkaline Phosphatase: 134 U/L (ref 40–150)
Anion Gap: 9 mEq/L (ref 3–11)
BUN: 17 mg/dL (ref 7.0–26.0)
CO2: 24 mEq/L (ref 22–29)
Calcium: 8.6 mg/dL (ref 8.4–10.4)
Chloride: 107 mEq/L (ref 98–109)
Creatinine: 0.9 mg/dL (ref 0.6–1.1)
EGFR: 67 mL/min/{1.73_m2} — ABNORMAL LOW (ref >90)
Glucose: 178 mg/dl — ABNORMAL HIGH (ref 70–140)
Potassium: 3.9 mEq/L (ref 3.5–5.1)
Sodium: 140 mEq/L (ref 136–145)
Total Bilirubin: 0.43 mg/dL (ref 0.20–1.20)
Total Protein: 7.2 g/dL (ref 6.4–8.3)

## 2015-02-27 MED ORDER — SODIUM CHLORIDE 0.9 % IJ SOLN
10.0000 mL | INTRAMUSCULAR | Status: DC | PRN
  Administered 2015-02-27: 10 mL via INTRAVENOUS
  Filled 2015-02-27: qty 10

## 2015-02-27 MED ORDER — HEPARIN SOD (PORK) LOCK FLUSH 100 UNIT/ML IV SOLN
500.0000 [IU] | Freq: Once | INTRAVENOUS | Status: AC
  Administered 2015-02-27: 500 [IU] via INTRAVENOUS
  Filled 2015-02-27: qty 5

## 2015-02-27 MED ORDER — OXYCODONE HCL 5 MG PO TABS: ORAL_TABLET | ORAL | Status: DC

## 2015-02-27 NOTE — Telephone Encounter (Signed)
Appointments made and avs printed for patient,patient to check in at next flush to see if dr Denman George schedule open yet

## 2015-02-27 NOTE — Progress Notes (Signed)
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 Treatment was 5 cycles of neoadjuvant taxol carboplatin, then interval debulking followed by 3 additional cycles of taxol carboplatin. . Last imaging was CT AP 07-04-14. She saw Dr Denman George 11-21-14, alternating visits every 3 months with gyn oncology and medical oncology.  Patient has ongoing problems with chronic back/ hip/ leg pain and restless legs, with related insomnia and fatigue. These problems were present long prior to the cancer diagnosis. She was seen remotely by Dr Gladstone Lighter for shoulder symptoms, has not seen orthopedics for back or LE symptoms.  She saw PCP Dr Lavone Neri at least in Sept, next apt ~ April 2017.  Patient denies abdominal or pelvic pain, bleeding, LE swelling, increased SOB or other respiratory symptoms. She uses a sleep monitor that records ~ 5 hrs sleep total nightly; she complains of feeling chronically fatigued, wakens because she is uncomfortable from back and LE.She uses oxycodone 1-2 qhs and mirapex qhs; she sometimes uses an additional oxycodone for the same pain once during day. Appetite is at baseline. Bowels are moving better and more regularly than during chemo.No fever or symptoms of infection. She believes PCP has checked thyroid (that office information not available to me now). Up to date on mammograms, no changes in breasts.  Overall she is somewhat more active than during chemo, but this still seems minimal and she is likely very deconditioned. Remainder of 10 point Review of Systems negative/ unchanged    PAC flushed today Genetics testing from 02-2014 normal (OvaNext panel) CA 125 1195 at diagnosis.  ER + on cytology 11-18-13  She and significant other married since she was here last, have been together 15 years.  ONCOLOGIC HISTORY Patient initially had problems in ~ July 2015 with stress urinary incontinence related to severe chronic cough. Urine culture by PCP showed no infection and she was referred to Dr Clois Comber, who adjusted medications with great improvement in the cough. Despite improved cough, she continued with some urinary incontinence. She saw Dr Ronita Hipps in 10-2013, with pelvic mass on exam and CA 125 1195 on 11-08-13. She had CT AP 11-12-13, which showed large ascites, irregular and shrunken liver, mild splenomegaly, increased soft tissue in pelvis bilaterally and anterior mid and lower peritoneum, small left pleural effusion. She saw Dr Denman George in consultation on 11-15-13, who agreed that this was likely gyn malignancy. She had US paracentesis of 4.1 liters on 11-18-13, with cytology ([id number]) and immunohistochemical stains consistent with serous carcinoma of Mullerian tract primary, favor ovarian, ER patchy positive. She had second paracentesis for 3.6 liters on 12-03-13. Gyn oncology recommended initial treatment with chemotherapy. She had first carboplatin and taxol on 12-07-13, with carboplatin dosed at AUC=4 and taxol at 135 mg/m2 due to concern for cirrhosis. She has needed gCSF support and even so was admitted with febrile neutropenia after cycle 3. CA125 was 1938 on 12-01-13 as baseline for chemotherapy, down to 130 prior to cycle 4 and 45 after cycle 5. 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 Chemo resumed 04-19-14, regimen changed to dose dense carbo taxol with granix days  2,3,9,16. She tolerated the dose dense regimen better overall, tho cycle 3 days 8 and 15 were held with neutropenia, symptomatic anemia and fatigue.  CT AP 07-04-14 had no obvious recurrent or persistent cancer. She did not go onto GOG 0225 due to [facility] research limitations when she was eligible.  Objective:  Vital signs in last 24 hours:  BP 136/61 mmHg  Pulse 65  Temp(Src) 98.7 F (37.1 C) (Oral)  Resp 18  Ht 5' 1.5" (1.562 m)  Wt 196 lb 14.4 oz (89.313 kg)  BMI 36.61 kg/m2  SpO2 99% Weight up 7 lbs Alert, oriented and appropriate. Ambulatory with some difficulty, able to get on and off exam table with assistance. Looks tired and mildly uncomfortable, NAD.    HEENT:PERRL, sclerae not icteric. Oral mucosa moist without lesions, posterior pharynx clear.  Neck supple. No JVD.  Lymphatics:no cervical,supraclavicular, axillary or inguinal adenopathy Resp: clear to auscultation bilaterally and normal percussion bilaterally Cardio: regular rate and rhythm. No gallop. GI: abdomen obese, soft, nontender, not distended, no appreciable mass or organomegaly. Normally active bowel sounds. Surgical incision not remarkable. Musculoskeletal/ Extremities: without pitting edema, cords, tenderness Neuro: no change peripheral neuropathy. Otherwise nonfocal. PSYCH somewhat depressed mood and affect Skin without rash, ecchymosis, petechiae Portacath-without erythema or tenderness  Lab Results:  Results for orders placed or performed in visit on 02/27/15  CBC with Differential  Result Value Ref Range   WBC 4.9 3.9 - 10.3 10e3/uL   NEUT# 3.2 1.5 - 6.5 10e3/uL   HGB 12.4 11.6 - 15.9 g/dL   HCT 37.5 34.8 - 46.6 %   Platelets 123 (L) 145 - 400 10e3/uL   MCV 89.7 79.5 - 101.0 fL   MCH 29.7 25.1 - 34.0 pg   MCHC 33.1 31.5 - 36.0 g/dL   RBC 4.18 3.70 - 5.45 10e6/uL   RDW 14.2 11.2 - 14.5 %   lymph# 1.4 0.9 - 3.3 10e3/uL   MONO# 0.3 0.1 - 0.9 10e3/uL   Eosinophils Absolute 0.1 0.0 - 0.5 10e3/uL   Basophils Absolute 0.0 0.0 - 0.1 10e3/uL   NEUT% 64.1 38.4 - 76.8 %   LYMPH% 27.4 14.0 - 49.7 %   MONO% 6.1 0.0 - 14.0 %   EOS% 2.2 0.0 - 7.0 %    BASO% 0.2 0.0 - 2.0 %  Comprehensive metabolic panel  Result Value Ref Range   Sodium 140 136 - 145 mEq/L   Potassium 3.9 3.5 - 5.1 mEq/L   Chloride 107 98 - 109 mEq/L   CO2 24 22 - 29 mEq/L   Glucose 178 (H) 70 - 140 mg/dl   BUN 17.0 7.0 - 26.0 mg/dL   Creatinine 0.9 0.6 - 1.1 mg/dL   Total Bilirubin 0.43 0.20 - 1.20 mg/dL   Alkaline Phosphatase 134 40 - 150 U/L   AST 53 (H) 5 - 34 U/L   ALT 87 (H) 0 - 55 U/L   Total Protein 7.2 6.4 - 8.3 g/dL   Albumin 3.6 3.5 - 5.0 g/dL   Calcium 8.6 8.4 - 10.4 mg/dL   Anion Gap 9 3 - 11 mEq/L   EGFR 67 (L) >90 ml/min/1.73 m2   Available after visit, CA 125 by new methodology 20.2, and by previous method 24, this having been 16 by previous method 11-21-14 and 14 on 10-13-14  Iron studies available after visit with serum iron 46 and %sat still low at 11.   Studies/Results:  No results found.  Medications: I have reviewed the patient's current  medications. Dr Denman George is refilling Estrace, as patient has been unable to tolerate stopping this due to hot flashes. Pain medication discussed. She is using oxycodone 1 -2 tabs at hs, unable to sleep otherwise due to low back/ hip/ LE pain. Refilled for #60 now, but should be done by PCP or ortho for these problems when those MDs are established. Should continue oral iron with lab results above.  DISCUSSION She would be feeling well now if not for the chronic low back pain/ hip pain/ pain shins bilaterally/ restless legs, all of which limit activity, prevent sleeping and are causing chronic fatigue from deconditioning and sleep deprivation. She has seen Dr Gladstone Lighter in past for shoulder; she will call that office to be seen for low back symptoms and can let me or PCP know if any problems getting appointment. She may want to transfer to [facility] PCP, very happy with Dr Lavone Neri whom she has seen x years but location more difficult.  Again encouraged her to try water exercise.   CA 125 available after visit slightly  higher, which may or may not be significant (has not doubled and is not out of normal range). With worsening back symptoms + this change, I will recommend repeating CT AP in next few weeks. This information likely will be of some help to orthopedist also, and she needs to get ortho evaluation regardless.     Assessment/Plan: 1.high grade serous primary peritoneal carcinoma: Neoadjuvant chemotherapy with carboplatin and taxol 12-07-13 thru cycle 5 on 03-08-14, with neulasta. Interval optimal debulking of what was still large amount of disease, with residual TNTC <108m tumor areas. Additional chemo given x 3 cycles after debulking, thru 43-82-50 complicated by cytopenias and neuropathy. NED by restaging in late May and marker still in normal range today tho may be trending up, is high risk for recurrence. Will let her know marker result and set up CT AP. If nothing of concern, she would be due to see Dr RDenman Georgein April.  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 Liver function tests stable thru chemo.Known to Dr MLucio Edward 3.PAC in: Need to flush every 6-8 weeks if not otherwise used. Keep due to very poor peripheral access and high risk for recurrent cancer. 4.diabetes on oral agent, has been following some blood sugars at home  5.stress urinary incontinence no worse 6.chronic cough:  resolved with medications by Dr WMelvyn Novas Past tobacco 7. 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 Tapered somewhat but intolerable hot flashes when lower than 1/2 tab daily. Dr RDenman Georgeis comfortable with her continuing the Estrace for now 11.narrow angle glaucoma: following with ophthalmology 12.restless legs: severe, chronically interferes with sleep. Mirapex helping somewhat, Dr Tat also felt this related  to iron deficiency.  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 Markedly impacting quality of life and activity/ sleep. Patient agrees to call Dr GCharlestine Nightoffice to set up appointment with him or partner; she is to let uKoreaknow if any problems getting this set up.   15.obesity with BMI 35.9: needs to lose to ideal. Discussed activity, encouraged water exercises again.  Diet probably not optimal. (GOG 0225 was unfortunately not available for her due to [place] at [place] of her eligibility).    All questions answered. TIme spent  25 min including >50% counseling and coordination of care. Cc Dr Lavone Neri and will send to orthopedist when that MD known   Gordy Levan, MD   02/27/2015, 9:20 AM

## 2015-02-27 NOTE — Patient Instructions (Signed)

## 2015-02-28 LAB — CA 125: Cancer Antigen (CA) 125: 20.2 U/mL (ref 0.0–38.1)

## 2015-02-28 LAB — CANCER ANTIGEN 125 (PARALLEL TESTING): CA 125: 24 U/mL (ref <35)

## 2015-03-01 ENCOUNTER — Telehealth: Payer: Self-pay | Admitting: *Deleted

## 2015-03-01 ENCOUNTER — Other Ambulatory Visit: Payer: Self-pay | Admitting: Oncology

## 2015-03-01 DIAGNOSIS — C482 Malignant neoplasm of peritoneum, unspecified: Secondary | ICD-10-CM

## 2015-03-01 NOTE — Telephone Encounter (Signed)
"  Could I get the results of my CA-125." Results provided.  CA 125 = 24 on 02-27-2015.  "It's going up, it was 16 or.  I can be reached on my mobile number [phone] if there is something I need to be concerned about."

## 2015-03-02 ENCOUNTER — Telehealth: Payer: Self-pay | Admitting: Oncology

## 2015-03-02 NOTE — Telephone Encounter (Signed)
-----   Message from Gordy Levan, MD sent at 03/01/2015  9:20 PM EST ----- Labs seen and need follow up: patient given CA 125 result by triage on 1-18. RN please let her know thatCA 125 may be trending up slightly, tho has not doubled and is not out of normal range, either of  which would be more concerning. Particularly with worsening back and LE pain, I would like to get CT AP in ~ a month with repeat CA 125 and MD visit again then. POF sent. I would still be glad for her to schedule with orthopedist.

## 2015-03-02 NOTE — Telephone Encounter (Signed)
Spoke with patient and she is aware of her appointments

## 2015-03-02 NOTE — Telephone Encounter (Signed)
Gave pt information per Dr Edwyna Shell. The pt is going to try to get an appt with her orthopedist, if she needs help with that she will call. She is expecting calls from scheduler re: CT and labs/MD

## 2015-03-05 ENCOUNTER — Encounter: Payer: Self-pay | Admitting: Oncology

## 2015-03-05 DIAGNOSIS — M545 Low back pain, unspecified: Secondary | ICD-10-CM | POA: Insufficient documentation

## 2015-03-05 DIAGNOSIS — D509 Iron deficiency anemia, unspecified: Secondary | ICD-10-CM | POA: Insufficient documentation

## 2015-03-22 ENCOUNTER — Encounter (HOSPITAL_COMMUNITY): Payer: Self-pay

## 2015-03-22 ENCOUNTER — Ambulatory Visit (HOSPITAL_COMMUNITY)
Admission: RE | Admit: 2015-03-22 | Discharge: 2015-03-22 | Disposition: A | Discharge status: Home / Self Care | Payer: Medicare Other | Source: Ambulatory Visit | Attending: Oncology | Admitting: Oncology

## 2015-03-22 DIAGNOSIS — N289 Disorder of kidney and ureter, unspecified: Secondary | ICD-10-CM | POA: Diagnosis not present

## 2015-03-22 DIAGNOSIS — C482 Malignant neoplasm of peritoneum, unspecified: Secondary | ICD-10-CM

## 2015-03-22 DIAGNOSIS — M4806 Spinal stenosis, lumbar region: Secondary | ICD-10-CM | POA: Insufficient documentation

## 2015-03-22 DIAGNOSIS — M545 Low back pain: Secondary | ICD-10-CM | POA: Insufficient documentation

## 2015-03-22 DIAGNOSIS — K7460 Unspecified cirrhosis of liver: Secondary | ICD-10-CM | POA: Insufficient documentation

## 2015-03-22 DIAGNOSIS — I864 Gastric varices: Secondary | ICD-10-CM | POA: Insufficient documentation

## 2015-03-22 DIAGNOSIS — R161 Splenomegaly, not elsewhere classified: Secondary | ICD-10-CM | POA: Diagnosis not present

## 2015-03-22 MED ORDER — IOHEXOL 300 MG/ML  SOLN
50.0000 mL | Freq: Once | INTRAMUSCULAR | Status: AC | PRN
  Administered 2015-03-22: 50 mL via ORAL

## 2015-03-22 MED ORDER — IOHEXOL 300 MG/ML  SOLN
100.0000 mL | Freq: Once | INTRAMUSCULAR | Status: AC | PRN
  Administered 2015-03-22: 100 mL via INTRAVENOUS

## 2015-03-26 ENCOUNTER — Other Ambulatory Visit: Payer: Self-pay | Admitting: Oncology

## 2015-03-26 DIAGNOSIS — C482 Malignant neoplasm of peritoneum, unspecified: Secondary | ICD-10-CM

## 2015-03-27 ENCOUNTER — Telehealth: Payer: Self-pay | Admitting: Oncology

## 2015-03-27 ENCOUNTER — Ambulatory Visit: Payer: Medicare Other

## 2015-03-27 ENCOUNTER — Ambulatory Visit (HOSPITAL_BASED_OUTPATIENT_CLINIC_OR_DEPARTMENT_OTHER): Payer: Medicare Other

## 2015-03-27 ENCOUNTER — Ambulatory Visit (HOSPITAL_BASED_OUTPATIENT_CLINIC_OR_DEPARTMENT_OTHER): Payer: Medicare Other | Admitting: Oncology

## 2015-03-27 ENCOUNTER — Encounter: Payer: Self-pay | Admitting: Oncology

## 2015-03-27 VITALS — BP 130/58 | HR 54 | Temp 98.0°F | Resp 18 | Ht 61.5 in | Wt 194.6 lb

## 2015-03-27 DIAGNOSIS — G622 Polyneuropathy due to other toxic agents: Secondary | ICD-10-CM

## 2015-03-27 DIAGNOSIS — N393 Stress incontinence (female) (male): Secondary | ICD-10-CM

## 2015-03-27 DIAGNOSIS — Z95828 Presence of other vascular implants and grafts: Secondary | ICD-10-CM

## 2015-03-27 DIAGNOSIS — M545 Low back pain: Secondary | ICD-10-CM

## 2015-03-27 DIAGNOSIS — G2581 Restless legs syndrome: Secondary | ICD-10-CM

## 2015-03-27 DIAGNOSIS — K7460 Unspecified cirrhosis of liver: Secondary | ICD-10-CM | POA: Diagnosis not present

## 2015-03-27 DIAGNOSIS — E669 Obesity, unspecified: Secondary | ICD-10-CM | POA: Diagnosis not present

## 2015-03-27 DIAGNOSIS — E119 Type 2 diabetes mellitus without complications: Secondary | ICD-10-CM | POA: Diagnosis not present

## 2015-03-27 DIAGNOSIS — C482 Malignant neoplasm of peritoneum, unspecified: Secondary | ICD-10-CM | POA: Diagnosis present

## 2015-03-27 DIAGNOSIS — D696 Thrombocytopenia, unspecified: Secondary | ICD-10-CM

## 2015-03-27 DIAGNOSIS — R161 Splenomegaly, not elsewhere classified: Secondary | ICD-10-CM

## 2015-03-27 LAB — CBC WITH DIFFERENTIAL/PLATELET
BASO%: 0.6 % (ref 0.0–2.0)
Basophils Absolute: 0 10*3/uL (ref 0.0–0.1)
EOS%: 2.1 % (ref 0.0–7.0)
Eosinophils Absolute: 0.1 10*3/uL (ref 0.0–0.5)
HCT: 40 % (ref 34.8–46.6)
HGB: 12.9 g/dL (ref 11.6–15.9)
LYMPH%: 28.7 % (ref 14.0–49.7)
MCH: 28.4 pg (ref 25.1–34.0)
MCHC: 32.2 g/dL (ref 31.5–36.0)
MCV: 88.4 fL (ref 79.5–101.0)
MONO#: 0.3 10*3/uL (ref 0.1–0.9)
MONO%: 4.8 % (ref 0.0–14.0)
NEUT#: 3.8 10*3/uL (ref 1.5–6.5)
NEUT%: 63.8 % (ref 38.4–76.8)
Platelets: 138 10*3/uL — ABNORMAL LOW (ref 145–400)
RBC: 4.52 10*6/uL (ref 3.70–5.45)
RDW: 14.7 % — ABNORMAL HIGH (ref 11.2–14.5)
WBC: 6 10*3/uL (ref 3.9–10.3)
lymph#: 1.7 10*3/uL (ref 0.9–3.3)

## 2015-03-27 LAB — COMPREHENSIVE METABOLIC PANEL
ALT: 102 U/L — ABNORMAL HIGH (ref 0–55)
AST: 57 U/L — ABNORMAL HIGH (ref 5–34)
Albumin: 3.8 g/dL (ref 3.5–5.0)
Alkaline Phosphatase: 118 U/L (ref 40–150)
Anion Gap: 11 mEq/L (ref 3–11)
BUN: 16.8 mg/dL (ref 7.0–26.0)
CO2: 22 mEq/L (ref 22–29)
Calcium: 9 mg/dL (ref 8.4–10.4)
Chloride: 107 mEq/L (ref 98–109)
Creatinine: 0.8 mg/dL (ref 0.6–1.1)
EGFR: 73 mL/min/{1.73_m2} — ABNORMAL LOW (ref >90)
Glucose: 140 mg/dl (ref 70–140)
Potassium: 3.7 mEq/L (ref 3.5–5.1)
Sodium: 140 mEq/L (ref 136–145)
Total Bilirubin: 0.43 mg/dL (ref 0.20–1.20)
Total Protein: 7.6 g/dL (ref 6.4–8.3)

## 2015-03-27 MED ORDER — HEPARIN SOD (PORK) LOCK FLUSH 100 UNIT/ML IV SOLN
500.0000 [IU] | Freq: Once | INTRAVENOUS | Status: AC
  Administered 2015-03-27: 500 [IU] via INTRAVENOUS
  Filled 2015-03-27: qty 5

## 2015-03-27 MED ORDER — SODIUM CHLORIDE 0.9% FLUSH
10.0000 mL | INTRAVENOUS | Status: DC | PRN
  Administered 2015-03-27: 10 mL via INTRAVENOUS
  Filled 2015-03-27: qty 10

## 2015-03-27 NOTE — Progress Notes (Signed)
OFFICE PROGRESS NOTE   March 29, 2015   Physicians: Everitt Amber; Helane Rima, MD (PCP), Luvenia Starch.Wert, P.Nahser, M.Stark/ J.Mann, R.Tat,  R.Gioffre  INTERVAL HISTORY:  Patient is seen, together with husband, in follow up of slight rise in CA 125 marker which was evaluated with CT AP 03-22-15. 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 She has been on observation for the primary peritonal carcinoma since completing chemotherapy 05-30-14.  She is to see Dr Denman George next in April.  When I saw her last on 02-27-15, patient was complaining of worsening pain low back and hips, with same severe restless legs. She saw Dr Gladstone Lighter, MIR thru his office last week and follow up with him on 03-31-15. The present CT shows extensive degenerative changes in spine, including spurring and degenerative disc disease L4-5 and thecal sac compression; patient reports that Dr Cleda Clarks also questions hip problems.  Patient would be feeling generally well if not for back and LE problems, which also interfere with sleep and limit activity. She has some soreness at anterior abdomen with activity, may be referred from back. She has no abdominal distension, no clear pelvic pain, bowels moving regularly, appetite ok, no nausea or vomiting, no LE swelling, no bleeding, no increased SOB, no fever or symptoms of infection. Remainder of 10 point Review of Systems negative/ unchanged.  Note also known to have cirrhosis thought NASH and mild splenomegaly related to this.   PAC flushed today (03-27-15)_ Genetics testing from 02-2014 normal (OvaNext panel) CA 125 1195 at diagnosis. ER + on cytology 11-18-13  ONCOLOGIC HISTORY Patient initially had problems in ~ July 2015 with stress urinary incontinence related to severe chronic cough. Urine culture by PCP showed no infection and she was referred to Dr Clois Comber, who adjusted  medications with great improvement in the cough. Despite improved cough, she continued with some urinary incontinence. She saw Dr Ronita Hipps in 10-2013, with pelvic mass on exam and CA 125 1195 on 11-08-13. She had CT AP 11-12-13, which showed large ascites, irregular and shrunken liver, mild splenomegaly, increased soft tissue in pelvis bilaterally and anterior mid and lower peritoneum, small left pleural effusion. She saw Dr Denman George in consultation on 11-15-13, who agreed that this was likely gyn malignancy. She had US paracentesis of 4.1 liters on 11-18-13, with cytology ([id number]) and immunohistochemical stains consistent with serous carcinoma of Mullerian tract primary, favor ovarian, ER patchy positive. She had second paracentesis for 3.6 liters on 12-03-13. Gyn oncology recommended initial treatment with chemotherapy. She had first carboplatin and taxol on 12-07-13, with carboplatin dosed at AUC=4 and taxol at 135 mg/m2 due to concern for cirrhosis. She has needed gCSF support and even so was admitted with febrile neutropenia after cycle 3. CA125 was 1938 on 12-01-13 as baseline for chemotherapy, down to 130 prior to cycle 4 and 45 after cycle 5. 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 Chemo resumed 04-19-14, regimen changed to dose dense carbo taxol with granix days 2,3,9,16. She tolerated the dose dense regimen better overall, tho cycle 3 days 8 and 15 were held with neutropenia, symptomatic anemia and fatigue. CT AP 07-04-14 had no obvious recurrent or persistent cancer. She did not go onto GOG 0225 due to [facility] research limitations when she was eligible.  Objective:  Vital signs in last 24 hours:  BP 130/58 mmHg  Pulse 54  Temp(Src) 98 F (36.7 C) (Oral)  Resp 18  Ht 5' 1.5" (1.562 m)  Wt 194 lb 9.6 oz (88.27 kg)  BMI 36.18 kg/m2   SpO2 99% Weight down 2.5 lbs Alert, oriented and appropriate. Ambulatory without assistance.  No alopecia  HEENT:PERRL, sclerae not icteric. Oral mucosa moist without lesions, posterior pharynx clear.  Neck supple. No JVD.  Lymphatics:no cervical,supraclavicular adenopathy Resp: clear to auscultation bilaterally and normal percussion bilaterally Cardio: regular rate and rhythm. No gallop. GI: abdomen obese, soft, nontender, not distended, no appreciable mass or organomegaly. Normally active bowel sounds.  Musculoskeletal/ Extremities: without pitting edema, cords, tenderness Neuro: no change peripheral neuropathy. PSYCH appropriate mood and affect Skin without rash, ecchymosis, petechiae Portacath-without erythema or tenderness, flushed with good blood return today.  Lab Results: CMET normal with exception of AST 57 and ALT 102, these previously elevated. Creatinine 0.8, alb 3.8 CBC   WBC 6.0, ANC 3.8, Hgb 12.9, plt 138  CA 125 available after visit by new lab method 25, this having been 20 by same method on 02-27-15 CA 125 by previous lab method 29 after visit today, this having been 24 on 02-27-15 and 16 on 11-21-14.   Iron studies from 02-28-15 have serum iron 46, %sat 11   IMAGING CT ABDOMEN AND PELVIS WITH CONTRAST  03-22-15  COMPARISON: Multiple exams, including 07/04/2014  FINDINGS: Lower chest: Bilateral breast implants. Linear scarring in the right middle lobe, lingula, and both lower lobes. No basilar pleural effusion identified.  Hepatobiliary: Hepatic cirrhosis with nodular margins related to cirrhosis.  Pancreas: Unremarkable  Spleen: Mild splenomegaly, splenic volume 510 cc.  Adrenals/Urinary Tract: The adrenal glands unremarkable. Mild left mid kidney scarring.  Stomach/Bowel: Sigmoid diverticulosis. No bowel obstruction identified.  Vascular/Lymphatic: Small retroperitoneal lymph nodes are not pathologically enlarged by size  criteria.  Reproductive: Uterus and ovaries absent.  Other: Trace perihepatic ascites most notable superiorly. This is similar to prior very subtle nodularity along the right paracolic gutter similar to prior, without a well-defined mass. Faint stranding along the omentum and mesentery without well-defined recurrent mass.  Musculoskeletal: Stable superior endplate compression or very large Schmorl' s node along the superior endplate of L3 with minimal degenerative retrolisthesis at L3-4 and anterolisthesis at L4-5. No pars defects. This appearance is similar to prior. There is mild left foraminal stenosis at L4-5 due to spurring. There is likely some central narrowing of the thecal sac at this level due to degenerative disc disease.  IMPRESSION: 1. Filler appearance to the prior exam, with very subtle fluid tracking along portions of the liver, and very minimal nodularity along the right paracolic gutter representing residua from the prior peritoneal cancer. The current abnormalities could simply be post therapy findings rather than necessarily representing residual malignancy. No new or enlarging lesions are identified. 2. Hepatic cirrhosis and splenomegaly. There is some gastric varices suggesting portal venous hypertension. 3. Left foraminal stenosis at L4-5 due to spurring. There is likely also some central narrowing of the thecal sac at this level. 4. Bibasilar scarring. 5. Chronic mild left mid kidney scarring.    Medications: I have reviewed the patient's current medications. Try to continue oral iron, either as regular tablet 1-2x weekly or multivit with iron daily. Would not give IV iron with hemoglobin >12 even with somewhat low iron sat.  DISCUSSION Patient and husband understand that CT images appear stable compared with 06-2014 by radiologist interpretation,  stilll with subtle fluid at liver and minimal nodularity at right paracolic gutter. I will be sure that Dr  Denman George is aware of marker and CT findings, in case she wants anything done differently prior to her next visit planned 05-2015. Will get [facility] from [facility] with flush coordinating with that gyn onc visit in April. As long as no concerns, she would then have PAC flush ~ 6 weeks after gyn onc visit and I will see her with labs and PAC flush ~ 12 weeks after April gyn onc visit.   Assessment/Plan:  1.high grade serous primary peritoneal carcinoma: Neoadjuvant chemotherapy with carboplatin and taxol 12-07-13 thru cycle 5 on 03-08-14, with neulasta. Interval optimal debulking of what was still large amount of disease, with residual TNTC <25m tumor areas. Additional chemo given x 3 cycles after debulking, thru 46-30-16 complicated by cytopenias and neuropathy. NED by restaging in late May.  Marker still in normal range today tho may be trending up slightly, no apparent changes on CT AP 03-22-15, is high risk for recurrence. Has orthopedic etiologies for back and LE pain. I will let Dr RDenman Georgeknow situation, to see her at least in April and [facility] 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 Liver function tests stable thru chemo, a little higher now.Known to Dr MLucio Edward 3.PAC in: Need to flush every 6-8 weeks if not otherwise used. Keep due to very poor peripheral access and high risk for recurrent cancer. 4.diabetes on oral agent, has been following some blood sugars at home  5.stress urinary incontinence no worse 6.chronic cough: resolved with medications by Dr WMelvyn Novas Past tobacco 7. 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 Tapered somewhat but intolerable hot flashes when lower than 1/2 tab daily. Dr RDenman Georgeis  comfortable with her continuing the Estrace for now 11.narrow angle glaucoma: following with ophthalmology 12.restless legs: severe, chronically interferes with sleep. Mirapex helping somewhat, Dr Tat also felt this related to iron deficiency; see discussion of iron under meds above. 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 Markedly impacting quality of life and activity/ sleep. Appreciate Dr GCharlestine Nightassistance now.  15.obesity with BMI 36: needs to lose to ideal. Discussed activity, encouraged water exercises again. Diet probably not optimal. (GOG 0225 was unfortunately not available for her [place] to [place]). 16.recently [place] She and [place]one Neuhave been together for 14 years.  All questions answered. Time spent 25 min including >50% counseling and coordination of care. Cc Drs RDenman George GGladstone Lighter HDarrick Huntsman MD   03/29/2015, 10:39 AM

## 2015-03-27 NOTE — Telephone Encounter (Signed)
Appointments made and avs printed °

## 2015-03-28 LAB — CA 125: Cancer Antigen (CA) 125: 25.3 U/mL (ref 0.0–38.1)

## 2015-03-28 LAB — CANCER ANTIGEN 125 (PARALLEL TESTING): CA 125: 29 U/mL (ref <35)

## 2015-03-29 DIAGNOSIS — Z95828 Presence of other vascular implants and grafts: Secondary | ICD-10-CM | POA: Insufficient documentation

## 2015-03-31 ENCOUNTER — Telehealth: Payer: Self-pay | Admitting: *Deleted

## 2015-03-31 ENCOUNTER — Other Ambulatory Visit: Payer: Self-pay | Admitting: Orthopedic Surgery

## 2015-03-31 DIAGNOSIS — M5441 Lumbago with sciatica, right side: Principal | ICD-10-CM

## 2015-03-31 DIAGNOSIS — G8929 Other chronic pain: Secondary | ICD-10-CM

## 2015-03-31 DIAGNOSIS — M5442 Lumbago with sciatica, left side: Principal | ICD-10-CM

## 2015-03-31 NOTE — Telephone Encounter (Signed)
Fine to let her know CA 125 is 25, up from 20 by same method last labs. I have let Dr Denman George know about the CA 125 and the CT information. It is still possible that there is some slight increased activity of the cancer that we cannot pick up on scans, so will follow closely. She should keep Dr Serita Grit appointment (I believe in 2 months but I cannot get to that part of EMR from this note) and we will follow marker with next PAC flush then. If she has concerns before next apt, call, however nothing different on CT that we can tell.  thanks

## 2015-03-31 NOTE — Telephone Encounter (Signed)
Discussed the results of the CA-125 as noted below by Dr. Marko Plume.  Olivia Benson verbalized understanding.

## 2015-03-31 NOTE — Telephone Encounter (Signed)
Pt called wanting to know results of CA 125.  Message routed to Dr. Marko Plume and Barbaraann Share, desk nurse. Pt's   Phone     [phone].

## 2015-04-24 ENCOUNTER — Ambulatory Visit
Admission: RE | Admit: 2015-04-24 | Discharge: 2015-04-24 | Disposition: A | Discharge status: Home / Self Care | Payer: Self-pay | Source: Ambulatory Visit | Attending: Orthopedic Surgery | Admitting: Orthopedic Surgery

## 2015-04-24 ENCOUNTER — Other Ambulatory Visit: Payer: Self-pay | Admitting: Orthopedic Surgery

## 2015-04-24 ENCOUNTER — Ambulatory Visit
Admission: RE | Admit: 2015-04-24 | Discharge: 2015-04-24 | Disposition: A | Discharge status: Home / Self Care | Payer: Medicare Other | Source: Ambulatory Visit | Attending: Orthopedic Surgery | Admitting: Orthopedic Surgery

## 2015-04-24 DIAGNOSIS — G8929 Other chronic pain: Secondary | ICD-10-CM

## 2015-04-24 DIAGNOSIS — M5441 Lumbago with sciatica, right side: Principal | ICD-10-CM

## 2015-04-24 DIAGNOSIS — M5442 Lumbago with sciatica, left side: Principal | ICD-10-CM

## 2015-04-24 MED ORDER — MEPERIDINE HCL 100 MG/ML IJ SOLN
75.0000 mg | Freq: Once | INTRAMUSCULAR | Status: AC
  Administered 2015-04-24: 75 mg via INTRAMUSCULAR

## 2015-04-24 MED ORDER — IOHEXOL 180 MG/ML  SOLN
17.0000 mL | Freq: Once | INTRAMUSCULAR | Status: AC | PRN
  Administered 2015-04-24: 17 mL via INTRATHECAL

## 2015-04-24 MED ORDER — ONDANSETRON HCL 4 MG/2ML IJ SOLN
4.0000 mg | Freq: Once | INTRAMUSCULAR | Status: AC
  Administered 2015-04-24: 4 mg via INTRAMUSCULAR

## 2015-04-24 MED ORDER — DIAZEPAM 5 MG PO TABS
5.0000 mg | ORAL_TABLET | Freq: Once | ORAL | Status: AC
  Administered 2015-04-24: 5 mg via ORAL

## 2015-04-24 NOTE — Discharge Instructions (Signed)

## 2015-05-08 ENCOUNTER — Ambulatory Visit: Payer: Medicare Other

## 2015-05-08 VITALS — BP 152/69 | HR 66 | Temp 98.2°F | Resp 18

## 2015-05-08 DIAGNOSIS — Z95828 Presence of other vascular implants and grafts: Secondary | ICD-10-CM

## 2015-05-08 MED ORDER — SODIUM CHLORIDE 0.9% FLUSH
10.0000 mL | INTRAVENOUS | Status: DC | PRN
  Administered 2015-05-08: 10 mL via INTRAVENOUS
  Filled 2015-05-08: qty 10

## 2015-05-08 MED ORDER — HEPARIN SOD (PORK) LOCK FLUSH 100 UNIT/ML IV SOLN
500.0000 [IU] | Freq: Once | INTRAVENOUS | Status: AC
  Administered 2015-05-08: 500 [IU] via INTRAVENOUS
  Filled 2015-05-08: qty 5

## 2015-05-08 NOTE — Patient Instructions (Signed)

## 2015-05-24 ENCOUNTER — Ambulatory Visit (INDEPENDENT_AMBULATORY_CARE_PROVIDER_SITE_OTHER): Payer: Medicare Other | Admitting: Neurology

## 2015-05-24 ENCOUNTER — Encounter: Payer: Self-pay | Admitting: Neurology

## 2015-05-24 ENCOUNTER — Ambulatory Visit: Payer: Medicare Other | Admitting: Neurology

## 2015-05-24 ENCOUNTER — Other Ambulatory Visit: Payer: Medicare Other

## 2015-05-24 VITALS — BP 114/60 | HR 68 | Ht 61.5 in | Wt 201.0 lb

## 2015-05-24 DIAGNOSIS — E611 Iron deficiency: Secondary | ICD-10-CM

## 2015-05-24 DIAGNOSIS — G2581 Restless legs syndrome: Secondary | ICD-10-CM

## 2015-05-24 LAB — IRON,TIBC AND FERRITIN PANEL
%SAT: 7 % — ABNORMAL LOW (ref 11–50)
Ferritin: 28 ng/mL (ref 20–288)
Iron: 34 ug/dL — ABNORMAL LOW (ref 45–160)
TIBC: 472 ug/dL — ABNORMAL HIGH (ref 250–450)

## 2015-05-24 MED ORDER — PRAMIPEXOLE DIHYDROCHLORIDE 0.5 MG PO TABS: ORAL_TABLET | ORAL | Status: DC

## 2015-05-24 NOTE — Progress Notes (Signed)
Olivia Benson was seen today in neurologic consultation at the request of Dr. Marko Plume. Her PCP is Helane Rima, MD.  The consultation is for RLS.  Prior records were reviewed.  The patient also has a history of primary peritoneal carcinomatosis, grade 3.  She has undergone previous debulking surgery and chemotherapy with carboplatin and Taxol, which was last completed on 05/30/2014.  Her chemotherapy has been complicated by neutropenia, peripheral neuropathy, symptomatically anemia and fatigue.  She presents today to discuss restless leg syndrome.  Pt states that her sx's consist of a crawling sensation on the legs; she feels that she has a spider web on the leg; "I just have to move."  It may have started pre-chemo but got much worse after chemo.  It starts mid afternoon once she sits and "gets quiet" and tries to watch TV.    She has previously tried flexeril, baclofen, ultram, restoril and oxycontin.  The only one that has been helpful is oxyIR.  She has never been on requip, mirapex, levodopa, klonopin.    She admits that she craves ice.  She has been anemic.  Her last Hgb was 10.6.    11/25/14 update:  The patient is following up today in regards to her restless leg syndrome that is likely secondary to iron deficiency anemia.  Last visit, I started her on pramipexole, 0.125 mg after dinner and 0.25 mg at night. When she wakes up at night, she has "bone pain" but no RLS sx's.  She is still on one oxycodone at night for the pain but has dropped that from 2 tablets.   I was able to get a copy of her TSH from January, 2015 and it was normal at 1.820.  Dr. Marko Plume did start her on oral iron after our last visit but unfortunately the patient did not tolerate that because of nausea and it was recommended that the patient try instead a multivitamin with iron and she couldn't tolerate that either.  Said that she actually tolerated oral iron better than MVI.  Said that her ice craving went away.  05/24/15  update:  The patient follows up today in regards to restless leg syndrome.  She is on pramipexole, 0.125 mg after dinner and 0.25 mg at night.  She has been worse but isn't sure if that is because she is no longer on her oxycontin.  Admits she isn't taking her iron supplements.   She isn't sleeping at all now.  Eating ice again.  She did get married since our last visit, but reports that she has been with the same gentleman for a long time.  States that her weight and BS have both been going up and she is frustrated with that.  She has had some arthritic pain and the indocin helps but she had some stomach pain.   PREVIOUS MEDICATIONS: n/a  ALLERGIES:   Allergies  Allergen Reactions  . Shellfish Allergy Anaphylaxis    HER THROAT SWELLS UP AND CLOSES  . Benadryl [Diphenhydramine Hcl] Other (See Comments)    Restless legs   . Penicillins Rash    WHEN SHE WAS TEENAGER  . Tape Itching    Red itch with some tapes    CURRENT MEDICATIONS:  Outpatient Encounter Prescriptions as of 05/24/2015  Medication Sig  . albuterol (PROVENTIL HFA;VENTOLIN HFA) 108 (90 BASE) MCG/ACT inhaler Inhale 2 puffs into the lungs every 4 (four) hours as needed for wheezing or shortness of breath. Reported on 02/27/2015  . amLODipine (  NORVASC) 2.5 MG tablet Take 2.5 mg by mouth daily.  . bisoprolol (ZEBETA) 5 MG tablet Take 1/2 tablet daily (Patient taking differently: Take 2.5 mg by mouth every morning. )  . estradiol (ESTRACE) 1 MG tablet Take 1 tablet (1 mg total) by mouth every other day.  . ibuprofen (ADVIL,MOTRIN) 200 MG tablet Take 800 mg by mouth every 6 (six) hours as needed for moderate pain (pain).  . indomethacin (INDOCIN) 25 MG capsule Take 2 capsules by mouth 2 (two) times daily.  Marland Kitchen lidocaine-prilocaine (EMLA) cream Apply 1 application topically as needed. Apply 1-2 hours prior to [facility]  access as directed.  . metFORMIN (GLUCOPHAGE-XR) 500 MG 24 hr tablet Take 500 mg by mouth 2 (two) times daily.   .  pantoprazole (PROTONIX) 40 MG tablet Take 1 tablet (40 mg total) by mouth 2 (two) times daily.  . pramipexole (MIRAPEX) 0.125 MG tablet Take 1 tablet at dinner, 2 tablets at night  . [DISCONTINUED] oxyCODONE (OXY IR/ROXICODONE) 5 MG immediate release tablet Take 1-2 tablets by mouth at bedtime as needed for pain.   No facility-administered encounter medications on file as of 05/24/2015.    PAST MEDICAL HISTORY:   Past Medical History  Diagnosis Date  . Diabetes mellitus   . Asthma   . Overactive bladder   . Elevated LFTs   . Obesity   . Hypertension   . Cirrhosis ([place])   . Portal hypertension ([place])   . Glaucoma, narrow-angle   . Ovarian cancer ([place]) 11/22/2013    Disseminated ovarian cancer  . Complication of anesthesia     slow to wake up / n & v  . PONV (postoperative nausea and vomiting)   . Family history of adverse reaction to anesthesia     multiple family members have had problems such as slow to wake / "flat lined" /  ventilator    . Non-ST elevated myocardial infarction (non-STEMI) [facility]) Sept 2012    Mild elevation in troponin and CK MB but no evidence of coronary disease at time of cath  . Tingling     hands and feet  . History of skin cancer   . Frequency of urination   . Restless leg syndrome     PAST SURGICAL HISTORY:   Past Surgical History  Procedure Laterality Date  . Tonsillectomy  1971  . Abdominal ultrasound  Sept 2012    No gallbladder disease, + fatty liver  . Cardiac catheterization  10/25/2010    EF 60%; Normal coronaries  . Colonoscopy  10/18/2005  . Wh-mammography  11/24/2009  . Partial hysterectomy  1988  . Cosmetic surgery  2003  . Tubal ligation    . Dental surgery    . Salpingoophorectomy Bilateral 03/29/2014    Procedure: BILATERAL SALPINGO OOPHORECTOMY, OMENTECTOMY, DEBULKING;  Surgeon: Everitt Amber, MD;  Location: [facility];  Service: Gynecology;  Laterality: Bilateral;  . Laparotomy N/A 03/29/2014    Procedure: EXPLORATORY LAPAROTOMY;   Surgeon: Everitt Amber, MD;  Location: [facility];  Service: Gynecology;  Laterality: N/A;  . 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 Marital Status: Single    Spouse Name: N/A  . Number of Children: 1 D  . Years of Education: N/A   Occupational History  . Retired    Social History Main Topics  . Smoking status:  Former Smoker -- 1.00 packs/day for 1 years    Types: Cigarettes    Quit date: 02/11/1982  . Smokeless tobacco: Current User  . Alcohol Use: No  . Drug Use: No  . Sexual Activity: Not on file   Other Topics Concern  . Not on file   Social History Narrative    FAMILY HISTORY:   Family Status  Relation Status Death Age  . Mother Alive     arthritis, htn, DM  . Father Deceased 46    bladder cancer, stroke  . Sister Alive     breast cancer  . Sister Alive     htn  . Sister Alive     htn  . Sister Alive     htn  . Maternal Aunt Alive   . Cousin Alive   . Maternal Uncle Deceased infancy  . Maternal Grandmother Deceased 58    pneumonia  . Maternal Grandfather Deceased   . Paternal Grandmother Deceased   . Paternal Grandfather Deceased   . Maternal Aunt Alive   . Maternal Aunt Alive   . Maternal Aunt Alive   . Son Deceased 72    died of brain anerysm, after a 20 ft fall  . Brother Alive     htn    ROS:  A complete 10 system review of systems was obtained and was unremarkable apart from what is mentioned above.  PHYSICAL EXAMINATION:    VITALS:   Filed Vitals:   05/24/15 0919  BP: 114/60  Pulse: 68  Height: 5' 1.5" (1.562 m)  Weight: 201 lb (91.173 kg)    GEN:  Normal appears female in no acute distress.  Appears stated age. HEENT:  Normocephalic, atraumatic. The mucous membranes are moist. The superficial temporal arteries are without ropiness or tenderness. Cardiovascular: Regular rate and rhythm. Lungs: Clear to auscultation  bilaterally. Neck/Heme: There are no carotid bruits noted bilaterally.  NEUROLOGICAL: Orientation:  The patient is alert and oriented x 3.   Cranial nerves: There is good facial symmetry.  Speech is fluent and clear. Soft palate rises symmetrically and there is no tongue deviation. Hearing is intact to conversational tone. Tone: Tone is good throughout. Sensation: Sensation is intact to light touch throughout Coordination:  The patient has no difficulty with RAM's or FNF bilaterally. Motor: Strength is 5/5 in the bilateral upper and lower extremities.  Shoulder shrug is equal and symmetric. There is no pronator drift.  There are no fasciculations noted. Gait and Station: The patient is able to ambulate without difficulty.   Lab Results  Component Value Date   WBC 6.0 03/27/2015   HGB 12.9 03/27/2015   HCT 40.0 03/27/2015   MCV 88.4 03/27/2015   PLT 138* 03/27/2015   Lab Results  Component Value Date   TSH 1.119 10/13/2014      IMPRESSION/PLAN  1. RLS, secondar to iron def anemia  -increase mirapex to 0.5 mg - 1/2 tablet after dinner and full tablet at bedtime.    -told her she needs to restart iron supplement  -recheck iron stores today  -likely also worsened because off of oxycontin.  May need to add ultram but we will try to work with agonist drug first. 2.  F/u 2 months, sooner should new issues arise.

## 2015-05-24 NOTE — Addendum Note (Signed)
Addended byAnnamaria Helling on: 05/24/2015 09:56 AM   Modules accepted: Orders

## 2015-05-24 NOTE — Patient Instructions (Signed)
1. Your provider has requested that you have labwork completed today. Please go to [facility] ([id number]) on the second floor of this building before leaving the office today. You do not need to check in. If you are not called within 15 minutes please check with the front desk.  2. Increase Mirapex to 0.5 mg tablets - 1/2 tablet at dinner time, 1 tablet at bedtime.  3. Follow up 2 months.

## 2015-05-25 ENCOUNTER — Telehealth: Payer: Self-pay | Admitting: Neurology

## 2015-05-25 NOTE — Telephone Encounter (Signed)
-----   Message from [place], DO sent at 05/25/2015  8:10 AM EDT ----- You can tell pt that there is a reason that her RLS likely is getting worse and that is because her iron deficiency has gotten much worse.  Needs to take that iron supplement faithfully

## 2015-05-25 NOTE — Telephone Encounter (Signed)
Patient made aware of lab results. She will take iron every day.

## 2015-05-25 NOTE — Telephone Encounter (Signed)
[facility] making patient aware and to call with any questions.

## 2015-05-25 NOTE — Telephone Encounter (Signed)
Olivia Benson was returning your call. She said you could call her back on the same number. Thank you

## 2015-05-29 ENCOUNTER — Other Ambulatory Visit: Payer: Medicare Other

## 2015-05-29 ENCOUNTER — Other Ambulatory Visit (HOSPITAL_BASED_OUTPATIENT_CLINIC_OR_DEPARTMENT_OTHER): Payer: Medicare Other

## 2015-05-29 ENCOUNTER — Ambulatory Visit: Payer: Medicare Other

## 2015-05-29 VITALS — BP 130/56 | HR 63 | Temp 97.7°F | Resp 16

## 2015-05-29 DIAGNOSIS — C482 Malignant neoplasm of peritoneum, unspecified: Secondary | ICD-10-CM

## 2015-05-29 DIAGNOSIS — Z95828 Presence of other vascular implants and grafts: Secondary | ICD-10-CM

## 2015-05-29 LAB — COMPREHENSIVE METABOLIC PANEL
ALT: 119 U/L — ABNORMAL HIGH (ref 0–55)
AST: 104 U/L — ABNORMAL HIGH (ref 5–34)
Albumin: 3.6 g/dL (ref 3.5–5.0)
Alkaline Phosphatase: 116 U/L (ref 40–150)
Anion Gap: 9 mEq/L (ref 3–11)
BUN: 22.7 mg/dL (ref 7.0–26.0)
CO2: 24 mEq/L (ref 22–29)
Calcium: 8.6 mg/dL (ref 8.4–10.4)
Chloride: 105 mEq/L (ref 98–109)
Creatinine: 1 mg/dL (ref 0.6–1.1)
EGFR: 61 mL/min/{1.73_m2} — ABNORMAL LOW (ref >90)
Glucose: 282 mg/dl — ABNORMAL HIGH (ref 70–140)
Potassium: 4.1 mEq/L (ref 3.5–5.1)
Sodium: 138 mEq/L (ref 136–145)
Total Bilirubin: 0.53 mg/dL (ref 0.20–1.20)
Total Protein: 6.8 g/dL (ref 6.4–8.3)

## 2015-05-29 LAB — CBC WITH DIFFERENTIAL/PLATELET
BASO%: 0.2 % (ref 0.0–2.0)
Basophils Absolute: 0 10*3/uL (ref 0.0–0.1)
EOS%: 1.1 % (ref 0.0–7.0)
Eosinophils Absolute: 0.1 10*3/uL (ref 0.0–0.5)
HCT: 32.5 % — ABNORMAL LOW (ref 34.8–46.6)
HGB: 10.8 g/dL — ABNORMAL LOW (ref 11.6–15.9)
LYMPH%: 31.5 % (ref 14.0–49.7)
MCH: 28.6 pg (ref 25.1–34.0)
MCHC: 33.2 g/dL (ref 31.5–36.0)
MCV: 86.2 fL (ref 79.5–101.0)
MONO#: 0.2 10*3/uL (ref 0.1–0.9)
MONO%: 4.5 % (ref 0.0–14.0)
NEUT#: 2.9 10*3/uL (ref 1.5–6.5)
NEUT%: 62.7 % (ref 38.4–76.8)
Platelets: 116 10*3/uL — ABNORMAL LOW (ref 145–400)
RBC: 3.77 10*6/uL (ref 3.70–5.45)
RDW: 14.8 % — ABNORMAL HIGH (ref 11.2–14.5)
WBC: 4.6 10*3/uL (ref 3.9–10.3)
lymph#: 1.5 10*3/uL (ref 0.9–3.3)

## 2015-05-29 MED ORDER — HEPARIN SOD (PORK) LOCK FLUSH 100 UNIT/ML IV SOLN
500.0000 [IU] | Freq: Once | INTRAVENOUS | Status: AC
  Administered 2015-05-29: 500 [IU] via INTRAVENOUS
  Filled 2015-05-29: qty 5

## 2015-05-29 MED ORDER — SODIUM CHLORIDE 0.9% FLUSH
10.0000 mL | INTRAVENOUS | Status: DC | PRN
  Administered 2015-05-29: 10 mL via INTRAVENOUS
  Filled 2015-05-29: qty 10

## 2015-05-29 NOTE — Patient Instructions (Signed)

## 2015-05-30 LAB — CA 125: Cancer Antigen (CA) 125: 29.9 U/mL (ref 0.0–38.1)

## 2015-05-31 ENCOUNTER — Encounter: Payer: Self-pay | Admitting: Gynecologic Oncology

## 2015-05-31 ENCOUNTER — Ambulatory Visit: Payer: Medicare Other | Attending: Gynecologic Oncology | Admitting: Gynecologic Oncology

## 2015-05-31 ENCOUNTER — Other Ambulatory Visit: Payer: Medicare Other

## 2015-05-31 ENCOUNTER — Ambulatory Visit: Payer: Medicare Other | Admitting: Gynecologic Oncology

## 2015-05-31 VITALS — BP 144/44 | HR 69 | Temp 98.1°F | Resp 18 | Ht 61.5 in | Wt 203.5 lb

## 2015-05-31 DIAGNOSIS — N3281 Overactive bladder: Secondary | ICD-10-CM | POA: Insufficient documentation

## 2015-05-31 DIAGNOSIS — Z888 Allergy status to other drugs, medicaments and biological substances status: Secondary | ICD-10-CM | POA: Insufficient documentation

## 2015-05-31 DIAGNOSIS — C569 Malignant neoplasm of unspecified ovary: Secondary | ICD-10-CM | POA: Diagnosis present

## 2015-05-31 DIAGNOSIS — I10 Essential (primary) hypertension: Secondary | ICD-10-CM | POA: Diagnosis not present

## 2015-05-31 DIAGNOSIS — Z9889 Other specified postprocedural states: Secondary | ICD-10-CM | POA: Diagnosis not present

## 2015-05-31 DIAGNOSIS — E669 Obesity, unspecified: Secondary | ICD-10-CM | POA: Diagnosis not present

## 2015-05-31 DIAGNOSIS — G2581 Restless legs syndrome: Secondary | ICD-10-CM | POA: Diagnosis not present

## 2015-05-31 DIAGNOSIS — Z7984 Long term (current) use of oral hypoglycemic drugs: Secondary | ICD-10-CM | POA: Insufficient documentation

## 2015-05-31 DIAGNOSIS — J45909 Unspecified asthma, uncomplicated: Secondary | ICD-10-CM | POA: Diagnosis not present

## 2015-05-31 DIAGNOSIS — R05 Cough: Secondary | ICD-10-CM | POA: Diagnosis not present

## 2015-05-31 DIAGNOSIS — Z85828 Personal history of other malignant neoplasm of skin: Secondary | ICD-10-CM | POA: Diagnosis not present

## 2015-05-31 DIAGNOSIS — Z79899 Other long term (current) drug therapy: Secondary | ICD-10-CM | POA: Insufficient documentation

## 2015-05-31 DIAGNOSIS — Z880 Allergy status to penicillin: Secondary | ICD-10-CM | POA: Insufficient documentation

## 2015-05-31 DIAGNOSIS — C482 Malignant neoplasm of peritoneum, unspecified: Secondary | ICD-10-CM | POA: Insufficient documentation

## 2015-05-31 DIAGNOSIS — H409 Unspecified glaucoma: Secondary | ICD-10-CM | POA: Diagnosis not present

## 2015-05-31 DIAGNOSIS — E119 Type 2 diabetes mellitus without complications: Secondary | ICD-10-CM | POA: Insufficient documentation

## 2015-05-31 DIAGNOSIS — I252 Old myocardial infarction: Secondary | ICD-10-CM | POA: Insufficient documentation

## 2015-05-31 DIAGNOSIS — R350 Frequency of micturition: Secondary | ICD-10-CM | POA: Insufficient documentation

## 2015-05-31 DIAGNOSIS — K766 Portal hypertension: Secondary | ICD-10-CM | POA: Insufficient documentation

## 2015-05-31 DIAGNOSIS — Z87891 Personal history of nicotine dependence: Secondary | ICD-10-CM | POA: Insufficient documentation

## 2015-05-31 DIAGNOSIS — N393 Stress incontinence (female) (male): Secondary | ICD-10-CM | POA: Diagnosis not present

## 2015-05-31 DIAGNOSIS — R188 Other ascites: Secondary | ICD-10-CM | POA: Insufficient documentation

## 2015-05-31 DIAGNOSIS — K7460 Unspecified cirrhosis of liver: Secondary | ICD-10-CM | POA: Insufficient documentation

## 2015-05-31 DIAGNOSIS — Z8543 Personal history of malignant neoplasm of ovary: Secondary | ICD-10-CM | POA: Diagnosis not present

## 2015-05-31 NOTE — Progress Notes (Signed)
Primary Peritoneal Cancer Followup Note: Gyn-Onc  Consult was initially requested by Dr. Ronita Hipps for the evaluation of Olivia Benson("[name]"), a 70 y.o. female with stage IIIC primary peritoneal cancer  CC: Dr Brien Few Chief Complaint  Patient presents with  . Ovarian Cancer    Follow up visit    Assessment/Plan:  [name] Olivia Benson") is a 70 y.o.  year old G1 with stage III primary peritoneal cancer now NED (no evidence of disease) after completing adjuvant therapy 6 months ago (in May, 2016). Slowly rising CA 125 but within normal range, and negative/stable CT imagine in February 2017.  Germline testing negative for BRCA mutations.  Continue surveillance phase with examinations every 3 months with CA 125 assessments, pelvic examinations and symptom assessments (alternating these visits with Dr Marko Plume and myself). Port flushes every 6 weeks. I reviewed symptoms to note as signs of recurrence.  Restless legs - will continue to pursue treatment with PCP.  Obesity - counseled regarding diet and exercise options.  HPI: Olivia Benson is a 70 year old G1P1 who was seen in consultation at the request of Dr Ronita Hipps in October 2015 for the findings of a pelvic mass on examination, CT findings consistent with ovarian cancer and an elevated CA 125. Olivia Benson had approximately 1 year history of cough, for which she underwent workup with Dr [name], a pulmonologist at [facility]. With extensive evaluation and medication modification, she feels "a million percent better". However, she continued to have stress urinary incontinence ( a symptom she has had for approximately 2 years). She had initially felt the SUI was associated with the cough, however, when she noticed that it did not improve with resolution of her cough, she sought further evaluation.  In September 2015 she was seen by her OBGYN Dr Ronita Hipps for the persistent SUI symptoms. He performed a pelvic examination which revealed a pelvic mass. He ordered a  CA 125: 1195 on 11/08/13; and a CT of the abdomen and pelvis on 11/12/13 which showed:  - A large amount of ascites present.  - Findings of progressive hepatic cirrhosis with a large amount of ascites and mild splenomegaly. There are venous collaterals present.The liver is shrunken and its surface is markedly irregular with mild splenomegaly. There prominence of mesenteric venous structures.   - The urinary bladder is partially distended and grossly normal.  - Increased peritoneal soft tissue density within the pelvis fairly symmetric from right to left. This could reflect mesenteric implants in the appropriate clinical setting. There are similar findings noted along the anterior mid and lower peritoneal surface on the left.  - No definite focal adnexal masses are demonstrated.  - There is a small left pleural effusion layering posteriorly.     The patient has had a hysterectomy for benign indications in 1988 (TAH).  She has a sister, a maternal aunt and a maternal cousin with breast cancer (none have been tested for BRCA).  Of note, in 2013 she had undergone a CT to work up abnormal LFT's. This showed an enlarged irregularly contoured liver and splenomegaly consistent with cirrhosis. A cirrhosis workup was unremarkable and her LFT's trended down, and therefore no further action was taken. She denies alcoholism, and rarely drinks.  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  She went on to receive 5 cycles of neoadjuvant chemotherapy with paclitaxel and carboplatin q 21 days. She has tolerated therapy very well with  no major dose limiting toxicities or treatment delays. Her most recent CA 125 on day 1 of cycle 4 was 130. She had required paracenteses x 3 during her first cycles of therapy to control ascites symptoms.  CT after cycle 4 (02/23/14):  1. Interval decrease in  ascites. 2. Morphologic changes in the liver consistent with cirrhosis. 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 Portal vein remains patent at this time. 3. Persistent but decreased abnormal soft tissue attenuation tracking in the omental fat. This may be secondary to interval improvement in metastatic disease. 4. Peritoneal thickening seen in the left abdomen and pelvis on the previous study has decreased and nearly resolved in the interval. This also suggests interval improvement in metastatic disease.  On April 08 2014 she underwent an exploratory laparotomy with BSO and radical tumor debulking. Intraoperative findings included a 10 cm omental cake, miliary studding of tumor implants on the small intestine measuring less than 3 mm each, small-volume ascites, small ovaries bilaterally with no apparent primary ovarian tumor, cirrhotic liver with hepatomegaly and splenomegaly. She underwent an optimal cytoreductive surgery and recovered in an uncomplicated fashion from surgery.  She went on to receive an additional 3 cycles of dose dense paclitaxel and carboplatin adjuvant chemotherapy completed in May 2016. She had dose delays secondary to bone marrow toxicity.  CA 125 drawn on her last cycle of chemotherapy in May 2016 was normal at 19.  CT scan of the chest, abdomen and pelvis on 07/04/14 after her last chemotherapy cycle showed: a complete radiographic response with cirrhosis of the liver with splenomegaly and gastric varices.  CA 125 in September 2016 was 14.   Interval History: She has been NED and doing well.  CA 125 has been slowly rising in the absence of symptoms of recurrence.  January 2017: 03 April 2015 25.15 May 2015: 29.9  CT abdo/pelvis on 03/22/15 showed stable appearance to prior exam. Subtle fluid tracking along gutter on right. No masses or gross recurrences. Heptatic cirrhosis present and splenomegaly. Signs of portal  hypertension.  She has had major issues with symptomatic restless leg syndrome.  She has been gaining weight (BMI 38, weight up 30 lbs since last exam with me) due to "portion size". No bloating or difficulty eating. No abdominal pain.  Current Meds:  Outpatient Encounter Prescriptions as of 05/31/2015  Medication Sig  . amLODipine (NORVASC) 2.5 MG tablet Take 2.5 mg by mouth daily.  . bisoprolol (ZEBETA) 5 MG tablet   . estradiol (ESTRACE) 1 MG tablet   . metFORMIN (GLUCOPHAGE-XR) 500 MG 24 hr tablet   . pantoprazole (PROTONIX) 40 MG tablet   . pramipexole (MIRAPEX) 0.5 MG tablet take 1/2 tablet by mouth AT DINNER and 1 tablet at bedtime  . [DISCONTINUED] amLODipine (NORVASC) 2.5 MG tablet Take 2.5 mg by mouth daily.  . [DISCONTINUED] bisoprolol (ZEBETA) 5 MG tablet Take 1/2 tablet daily (Patient taking differently: Take 2.5 mg by mouth every morning. )  . [DISCONTINUED] estradiol (ESTRACE) 1 MG tablet Take 1 tablet (1 mg total) by mouth every other day.  . [DISCONTINUED] indomethacin (INDOCIN) 25 MG capsule Take 2 capsules by mouth 2 (two) times daily.  . [DISCONTINUED] lidocaine-prilocaine (EMLA) cream Apply 1 application topically as needed. Apply 1-2 hours prior to [facility]  access as directed.  . [DISCONTINUED] metFORMIN (GLUCOPHAGE-XR) 500 MG 24 hr tablet Take 500 mg by mouth 2 (two) times daily.   . [DISCONTINUED] pantoprazole (PROTONIX) 40 MG tablet Take 1 tablet (40  mg total) by mouth 2 (two) times daily.  . [DISCONTINUED] albuterol (PROVENTIL HFA;VENTOLIN HFA) 108 (90 BASE) MCG/ACT inhaler Inhale 2 puffs into the lungs every 4 (four) hours as needed for wheezing or shortness of breath. Reported on 05/31/2015  . [DISCONTINUED] ibuprofen (ADVIL,MOTRIN) 200 MG tablet Take 800 mg by mouth every 6 (six) hours as needed for moderate pain (pain). Reported on 05/31/2015  . [DISCONTINUED] pramipexole (MIRAPEX) 0.5 MG tablet 1/2 tablet at dinner, 1 at bedtime (Patient not taking: Reported on  05/31/2015)   No facility-administered encounter medications on file as of 05/31/2015.    Allergy:  Allergies  Allergen Reactions  . Shellfish Allergy Anaphylaxis    HER THROAT SWELLS UP AND CLOSES  . Benadryl [Diphenhydramine Hcl] Other (See Comments)    Restless legs   . Penicillins Rash    WHEN SHE WAS TEENAGER  . Tape Itching    Red itch with some tapes    Social Hx:   Social History   Social History  . Marital Status: Single    Spouse Name: N/A  . Number of Children: 1 D  . Years of Education: N/A   Occupational History  . Retired    Social History Main Topics  . Smoking status: Former Smoker -- 1.00 packs/day for 1 years    Types: Cigarettes    Quit date: 02/11/1982  . Smokeless tobacco: Never Used  . Alcohol Use: No  . Drug Use: No  . Sexual Activity: Not on file   Other Topics Concern  . Not on file   Social History Narrative  former smoker (quit in the 73's) Used to work teaching bar tending. Remote history of heavier drinking but "never a problem". She now drinks once every few months.  Past Surgical Hx:  Past Surgical History  Procedure Laterality Date  . Tonsillectomy  1971  . Abdominal ultrasound  Sept 2012    No gallbladder disease, + fatty liver  . Cardiac catheterization  10/25/2010    EF 60%; Normal coronaries  . Colonoscopy  10/18/2005  . Wh-mammography  11/24/2009  . Partial hysterectomy  1988  . Cosmetic surgery  2003  . Tubal ligation    . Dental surgery    . Salpingoophorectomy Bilateral 03/29/2014    Procedure: BILATERAL SALPINGO OOPHORECTOMY, OMENTECTOMY, DEBULKING;  Surgeon: Everitt Amber, MD;  Location: [facility];  Service: Gynecology;  Laterality: Bilateral;  . Laparotomy N/A 03/29/2014    Procedure: EXPLORATORY LAPAROTOMY;  Surgeon: Everitt Amber, MD;  Location: [facility];  Service: Gynecology;  Laterality: N/A;  . Eye surgery Bilateral 09/19/14 R eye, 08/2014 L eye    open angles in both eyes d/t narrow angle glaucoma    Past Medical Hx:   Past Medical History  Diagnosis Date  . Diabetes mellitus   . Asthma   . Overactive bladder   . Elevated LFTs   . Obesity   . Hypertension   . Cirrhosis ([place])   . Portal hypertension ([place])   . Glaucoma, narrow-angle   . Ovarian cancer ([place]) 11/22/2013    Disseminated ovarian cancer  . Complication of anesthesia     slow to wake up / n & v  . PONV (postoperative nausea and vomiting)   . Family history of adverse reaction to anesthesia     multiple family members have had problems such as slow to wake / "flat lined" /  ventilator    . Non-ST elevated myocardial infarction (non-STEMI) [facility]) Sept 2012    Mild elevation  in troponin and CK MB but no evidence of coronary disease at time of cath  . Tingling     hands and feet  . History of skin cancer   . Frequency of urination   . Restless leg syndrome     Past Gynecological History:  Hysterectomy for heavy periods (benign) in 1988.  No LMP recorded. Patient has had a hysterectomy.  Family Hx:  Family History  Problem Relation Age of Onset  . Asthma Mother   . Diabetes Mother   . Hypertension Mother   . Allergies Mother   . Rheum arthritis Mother   . Bladder Cancer Father 45    former heavy smoker  . Hypertension Father   . Stroke Father   . Breast cancer Sister 67  . Diabetes Brother   . Allergies Brother   . Asthma Brother   . Diabetes Sister     x 3  . Allergies Sister   . Asthma Sister     x 2   . Breast cancer Maternal Aunt 85  . Breast cancer Cousin 68    (maternal) bilateral cancer  . Aneurysm Son 36    Review of Systems:  Constitutional  Feels well,  See HPI  ENT Normal appearing ears and nares bilaterally Skin/Breast  No rash, sores, jaundice, itching, dryness Cardiovascular  No chest pain, shortness of breath, or edema  Pulmonary  No cough or wheeze.  Gastro Intestinal  No nausea, vomitting, or diarrhoea. No bright red blood per rectum, no abdominal pain, change in bowel movement, or  constipation. Incision healed Genito Urinary  No frequency, urgency, dysuria, + stress incontinence.  Musculo Skeletal  No deformities Neurologic  No weakness, numbness, change in gait,  Psychology  No depression, anxiety, insomnia.   Vitals:  Blood pressure 144/44, pulse 69, temperature 98.1 F (36.7 C), temperature source Oral, resp. rate 18, height 5' 1.5" (1.562 m), weight 203 lb 8 oz (92.307 kg), SpO2 100 %.  Physical Exam: WD in NAD Neck  Supple NROM, without any enlargements.  Lymph Node Survey No cervical supraclavicular or inguinal adenopathy Cardiovascular  Pulse normal rate, regularity and rhythm. S1 and S2 normal.  Lungs  Clear to auscultation bilateraly, without wheezes/crackles/rhonchi. Good air movement.  Skin  Erythema and edema on distal right arm  Psychiatry  Alert and oriented to person, place, and time  Abdomen  Normoactive bowel sounds, abdomen soft, non-tender and overweight without evidence of hernia. No dullness to percussion. Incision healed Back No CVA tenderness  Genito Urinary  Vulva/vagina: Normal external female genitalia.  No lesions. No discharge or bleeding.  Bladder/urethra:  No lesions or masses, well supported bladder  Vagina: normal in appearance  Cervix: surgically absent  Uterus: surgically absent   Adnexa: no fullness or masses appreciated. Rectal  deferred Extremities  No bilateral cyanosis, clubbing or edema.   Donaciano Eva, MD   05/31/2015, 10:50 AM

## 2015-05-31 NOTE — Patient Instructions (Signed)
Follow up with Dr Everitt Amber on Oct 9 , 2017 at 1:15 , please call with changes , questions or concerns.  Thank you !

## 2015-06-01 ENCOUNTER — Encounter: Payer: Self-pay | Admitting: Neurology

## 2015-06-29 ENCOUNTER — Ambulatory Visit (HOSPITAL_BASED_OUTPATIENT_CLINIC_OR_DEPARTMENT_OTHER): Payer: Medicare Other

## 2015-06-29 DIAGNOSIS — Z452 Encounter for adjustment and management of vascular access device: Secondary | ICD-10-CM

## 2015-06-29 DIAGNOSIS — C482 Malignant neoplasm of peritoneum, unspecified: Secondary | ICD-10-CM

## 2015-06-29 DIAGNOSIS — Z95828 Presence of other vascular implants and grafts: Secondary | ICD-10-CM

## 2015-06-29 MED ORDER — SODIUM CHLORIDE 0.9 % IJ SOLN
10.0000 mL | INTRAMUSCULAR | Status: DC | PRN
  Administered 2015-06-29: 10 mL via INTRAVENOUS
  Filled 2015-06-29: qty 10

## 2015-06-29 MED ORDER — HEPARIN SOD (PORK) LOCK FLUSH 100 UNIT/ML IV SOLN
500.0000 [IU] | Freq: Once | INTRAVENOUS | Status: AC | PRN
  Administered 2015-06-29: 500 [IU] via INTRAVENOUS
  Filled 2015-06-29: qty 5

## 2015-06-29 NOTE — Patient Instructions (Signed)

## 2015-07-28 ENCOUNTER — Encounter: Payer: Self-pay | Admitting: Neurology

## 2015-07-28 ENCOUNTER — Other Ambulatory Visit: Payer: Medicare Other

## 2015-07-28 ENCOUNTER — Ambulatory Visit (INDEPENDENT_AMBULATORY_CARE_PROVIDER_SITE_OTHER): Payer: Medicare Other | Admitting: Neurology

## 2015-07-28 VITALS — BP 110/68 | HR 86 | Ht 61.5 in | Wt 204.0 lb

## 2015-07-28 DIAGNOSIS — D649 Anemia, unspecified: Secondary | ICD-10-CM

## 2015-07-28 DIAGNOSIS — E611 Iron deficiency: Secondary | ICD-10-CM | POA: Diagnosis not present

## 2015-07-28 LAB — IRON,TIBC AND FERRITIN PANEL
%SAT: 6 % — ABNORMAL LOW (ref 11–50)
Ferritin: 10 ng/mL — ABNORMAL LOW (ref 20–288)
Iron: 29 ug/dL — ABNORMAL LOW (ref 45–160)
TIBC: 465 ug/dL — ABNORMAL HIGH (ref 250–450)

## 2015-07-28 MED ORDER — ROTIGOTINE 1 MG/24HR TD PT24: 1.0000 | MEDICATED_PATCH | Freq: Every day | TRANSDERMAL | Status: DC

## 2015-07-28 NOTE — Patient Instructions (Signed)
1. Your provider has requested that you have labwork completed today. Please go to [facility] ([id number]) on the second floor of this building before leaving the office today. You do not need to check in. If you are not called within 15 minutes please check with the front desk.  2. Start Neupro 1 mg - 1 patch daily. Samples given and RX sent to pharmacy.  3. Stop Mirapex.

## 2015-07-28 NOTE — Progress Notes (Signed)
Olivia Benson was seen today in neurologic consultation at the request of Dr. Marko Plume. Her PCP is Helane Rima, MD.  The consultation is for RLS.  Prior records were reviewed.  The patient also has a history of primary peritoneal carcinomatosis, grade 3.  She has undergone previous debulking surgery and chemotherapy with carboplatin and Taxol, which was last completed on 05/30/2014.  Her chemotherapy has been complicated by neutropenia, peripheral neuropathy, symptomatically anemia and fatigue.  She presents today to discuss restless leg syndrome.  Pt states that her sx's consist of a crawling sensation on the legs; she feels that she has a spider web on the leg; "I just have to move."  It may have started pre-chemo but got much worse after chemo.  It starts mid afternoon once she sits and "gets quiet" and tries to watch TV.    She has previously tried flexeril, baclofen, ultram, restoril and oxycontin.  The only one that has been helpful is oxyIR.  She has never been on requip, mirapex, levodopa, klonopin.    She admits that she craves ice.  She has been anemic.  Her last Hgb was 10.6.    11/25/14 update:  The patient is following up today in regards to her restless leg syndrome that is likely secondary to iron deficiency anemia.  Last visit, I started her on pramipexole, 0.125 mg after dinner and 0.25 mg at night. When she wakes up at night, she has "bone pain" but no RLS sx's.  She is still on one oxycodone at night for the pain but has dropped that from 2 tablets.   I was able to get a copy of her TSH from January, 2015 and it was normal at 1.820.  Dr. Marko Plume did start her on oral iron after our last visit but unfortunately the patient did not tolerate that because of nausea and it was recommended that the patient try instead a multivitamin with iron and she couldn't tolerate that either.  Said that she actually tolerated oral iron better than MVI.  Said that her ice craving went away.  05/24/15  update:  The patient follows up today in regards to restless leg syndrome.  She is on pramipexole, 0.125 mg after dinner and 0.25 mg at night.  She has been worse but isn't sure if that is because she is no longer on her oxycontin.  Admits she isn't taking her iron supplements.   She isn't sleeping at all now.  Eating ice again.  She did get married since our last visit, but reports that she has been with the same gentleman for a long time.  States that her weight and BS have both been going up and she is frustrated with that.  She has had some arthritic pain and the indocin helps but she had some stomach pain.  07/28/15 update:  The patient is seen today in follow-up for restless leg syndrome, which has been secondary to iron deficiency.  Last visit, she was complaining about worsening symptoms and increased her pramipexole 0.5 mg, so that she was taking a half a tablet after dinner and one tablet at night.  She complains that this medication makes her cough and causes SOB so she only takes 1/2 after dinner and 1/2 at night. Last visit, I  also checked her iron, and the percent saturation was low at 7% and total iron was low at 34.  I told her that she absolutely needed to restart her iron supplements.  She  states that she is taking this and "it isn't helping and my cravings for ice is increasing."  Admits that she overall isn't feeling good.  "I have pain all over."  No taking diabetes meds as prescribed.     PREVIOUS MEDICATIONS: n/a  ALLERGIES:   Allergies  Allergen Reactions  . Shellfish Allergy Anaphylaxis    HER THROAT SWELLS UP AND CLOSES  . Benadryl [Diphenhydramine Hcl] Other (See Comments)    Restless legs   . Penicillins Rash    WHEN SHE WAS TEENAGER  . Tape Itching    Red itch with some tapes    CURRENT MEDICATIONS:  Outpatient Encounter Prescriptions as of 07/28/2015  Medication Sig  . amLODipine (NORVASC) 2.5 MG tablet Take 2.5 mg by mouth daily.  . bisoprolol (ZEBETA) 5 MG  tablet   . estradiol (ESTRACE) 1 MG tablet Take 1 mg by mouth as needed.   . indomethacin (INDOCIN) 25 MG capsule take 2 capsules by mouth every 12 hours  . metFORMIN (GLUCOPHAGE-XR) 500 MG 24 hr tablet   . pantoprazole (PROTONIX) 40 MG tablet   . pramipexole (MIRAPEX) 0.5 MG tablet Patient states she takes 1/2 at dinner and sometimes 1/2 at bedtime   No facility-administered encounter medications on file as of 07/28/2015.    PAST MEDICAL HISTORY:   Past Medical History  Diagnosis Date  . Diabetes mellitus   . Asthma   . Overactive bladder   . Elevated LFTs   . Obesity   . Hypertension   . Cirrhosis ([place])   . Portal hypertension ([place])   . Glaucoma, narrow-angle   . Ovarian cancer ([place]) 11/22/2013    Disseminated ovarian cancer  . Complication of anesthesia     slow to wake up / n & v  . PONV (postoperative nausea and vomiting)   . Family history of adverse reaction to anesthesia     multiple family members have had problems such as slow to wake / "flat lined" /  ventilator    . Non-ST elevated myocardial infarction (non-STEMI) [facility]) Sept 2012    Mild elevation in troponin and CK MB but no evidence of coronary disease at time of cath  . Tingling     hands and feet  . History of skin cancer   . Frequency of urination   . Restless leg syndrome     PAST SURGICAL HISTORY:   Past Surgical History  Procedure Laterality Date  . Tonsillectomy  1971  . Abdominal ultrasound  Sept 2012    No gallbladder disease, + fatty liver  . Cardiac catheterization  10/25/2010    EF 60%; Normal coronaries  . Colonoscopy  10/18/2005  . Wh-mammography  11/24/2009  . Partial hysterectomy  1988  . Cosmetic surgery  2003  . Tubal ligation    . Dental surgery    . Salpingoophorectomy Bilateral 03/29/2014    Procedure: BILATERAL SALPINGO OOPHORECTOMY, OMENTECTOMY, DEBULKING;  Surgeon: Everitt Amber, MD;  Location: [facility];  Service: Gynecology;  Laterality: Bilateral;  . Laparotomy N/A 03/29/2014     Procedure: EXPLORATORY LAPAROTOMY;  Surgeon: Everitt Amber, MD;  Location: [facility];  Service: Gynecology;  Laterality: N/A;  . 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 Marital Status: Single    Spouse Name: N/A  . Number of Children: 1 D  . Years  of Education: N/A   Occupational History  . Retired    Social History Main Topics  . Smoking status: Former Smoker -- 1.00 packs/day for 1 years    Types: Cigarettes    Quit date: 02/11/1982  . Smokeless tobacco: Never Used  . Alcohol Use: No  . Drug Use: No  . Sexual Activity: Not on file   Other Topics Concern  . Not on file   Social History Narrative    FAMILY HISTORY:   Family Status  Relation Status Death Age  . Mother Alive     arthritis, htn, DM  . Father Deceased 29    bladder cancer, stroke  . Sister Alive     breast cancer  . Sister Alive     htn  . Sister Alive     htn  . Sister Alive     htn  . Maternal Aunt Alive   . Cousin Alive   . Maternal Uncle Deceased infancy  . Maternal Grandmother Deceased 79    pneumonia  . Maternal Grandfather Deceased   . Paternal Grandmother Deceased   . Paternal Grandfather Deceased   . Maternal Aunt Alive   . Maternal Aunt Alive   . Maternal Aunt Alive   . Son Deceased 23    died of brain anerysm, after a 20 ft fall  . Brother Alive     htn    ROS:  A complete 10 system review of systems was obtained and was unremarkable apart from what is mentioned above.  PHYSICAL EXAMINATION:    VITALS:   Filed Vitals:   07/28/15 0832  BP: 110/68  Pulse: 86  Height: 5' 1.5" (1.562 m)  Weight: 204 lb (92.534 kg)  SpO2: 96%    GEN:  Normal appears female in no acute distress.  Appears stated age. HEENT:  Normocephalic, atraumatic. The mucous membranes are moist. The superficial temporal arteries are without ropiness or tenderness. Cardiovascular: Regular  rate and rhythm. Lungs: Clear to auscultation bilaterally. Neck/Heme: There are no carotid bruits noted bilaterally.  NEUROLOGICAL: Orientation:  The patient is alert and oriented x 3.   Cranial nerves: There is good facial symmetry.  Speech is fluent and clear. Soft palate rises symmetrically and there is no tongue deviation. Hearing is intact to conversational tone. Tone: Tone is good throughout. Sensation: Sensation is intact to light touch throughout Coordination:  The patient has no difficulty with RAM's or FNF bilaterally. Motor: Strength is 5/5 in the bilateral upper and and at least 5-/5 with give way weakness in the LE.  Shoulder shrug is equal and symmetric. There is no pronator drift.  There are no fasciculations noted. Gait and Station: The patient is able to ambulate without difficulty.   Lab Results  Component Value Date   WBC 4.6 05/29/2015   HGB 10.8* 05/29/2015   HCT 32.5* 05/29/2015   MCV 86.2 05/29/2015   PLT 116* 05/29/2015   Lab Results  Component Value Date   TSH 1.119 10/13/2014      IMPRESSION/PLAN  1. RLS, secondary to iron def anemia  -thinks that mirapex causes SOB and cough but that would be unusual.  States that she doesn't have that sx if doesn't take it.   Describes some augmentation.  Will d/c mirapex and try neupro, 1 mg.  Risks, benefits, side effects and alternative therapies were discussed.  The opportunity to ask questions was given and they were answered to the best of my ability.  The patient expressed  understanding and willingness to follow the outlined treatment protocols.  -will recheck iron, H/H  -likely also worsened because off of oxycontin.  May need to add ultram but we will try to work with agonist drug first. 2.  F/u 4-5 months, sooner should new issues arise.

## 2015-07-31 ENCOUNTER — Telehealth: Payer: Self-pay | Admitting: Neurology

## 2015-07-31 ENCOUNTER — Telehealth: Payer: Self-pay

## 2015-07-31 NOTE — Telephone Encounter (Signed)
-----   Message from [place], DO sent at 07/30/2015  2:54 PM EDT ----- Olivia Benson, please pt know that Iron stores are looking worse than they were.  Are we sure that she is taking iron supplements?  May want to talk with Dr. Marko Plume and see if IV iron is appropriate and I am copying her on this message as well.

## 2015-07-31 NOTE — Telephone Encounter (Signed)
-----   Message from [place], DO sent at 07/30/2015  2:54 PM EDT ----- Luvenia Starch, please pt know that Iron stores are looking worse than they were.  Are we sure that she is taking iron supplements?  May want to talk with Dr. Marko Plume and see if IV iron is appropriate and I am copying her on this message as well.

## 2015-07-31 NOTE — Telephone Encounter (Signed)
Spoke with Ms Padmore and told her that Dr. Marko Plume would be out of the office until 08-07-15. Will send this note to Dr. Marko Plume to review upon her return next week. Ms. Eaker appreciated the return call.

## 2015-07-31 NOTE — Telephone Encounter (Signed)
Patient made aware.

## 2015-07-31 NOTE — Telephone Encounter (Signed)
Olivia Benson calling to see if Dr. Marko Plume feels that she needs an iron infusion as her PCP Dr. Carles Collet said her iron studies were  low from 07-28-15.  Results in patient's EMR

## 2015-08-01 ENCOUNTER — Other Ambulatory Visit: Payer: Self-pay | Admitting: Oncology

## 2015-08-01 DIAGNOSIS — D509 Iron deficiency anemia, unspecified: Secondary | ICD-10-CM

## 2015-08-01 NOTE — Telephone Encounter (Signed)
feraheme orders placed for 08-10-15, ok to move date if needed, explain to patient that she will get a second dose a couple of weeks later POF done Thanks

## 2015-08-01 NOTE — Telephone Encounter (Addendum)
Told Olivia Benson that Dr. Marko Plume has sent orders to schedulers to set up appointments for IV iron.  Explained to her that she will receive a second IV iron infusion ~2 weeks after the first one. Schedulers will contact her with appointments.  Olivia Benson verbalized understanding.

## 2015-08-08 NOTE — Telephone Encounter (Signed)
S/w pt she is aware of infusion appt on 6/29.

## 2015-08-10 ENCOUNTER — Ambulatory Visit (HOSPITAL_BASED_OUTPATIENT_CLINIC_OR_DEPARTMENT_OTHER): Payer: Medicare Other

## 2015-08-10 VITALS — BP 124/46 | HR 63 | Temp 98.7°F | Resp 18

## 2015-08-10 DIAGNOSIS — D509 Iron deficiency anemia, unspecified: Secondary | ICD-10-CM

## 2015-08-10 DIAGNOSIS — Z95828 Presence of other vascular implants and grafts: Secondary | ICD-10-CM

## 2015-08-10 MED ORDER — SODIUM CHLORIDE 0.9 % IJ SOLN
10.0000 mL | INTRAMUSCULAR | Status: DC | PRN
  Administered 2015-08-10: 10 mL via INTRAVENOUS
  Filled 2015-08-10: qty 10

## 2015-08-10 MED ORDER — SODIUM CHLORIDE 0.9 % IV SOLN
INTRAVENOUS | Status: DC
  Administered 2015-08-10: 09:00:00 via INTRAVENOUS

## 2015-08-10 MED ORDER — SODIUM CHLORIDE 0.9 % IV SOLN
510.0000 mg | Freq: Once | INTRAVENOUS | Status: AC
  Administered 2015-08-10: 510 mg via INTRAVENOUS
  Filled 2015-08-10: qty 17

## 2015-08-10 MED ORDER — HEPARIN SOD (PORK) LOCK FLUSH 100 UNIT/ML IV SOLN
500.0000 [IU] | Freq: Once | INTRAVENOUS | Status: AC | PRN
  Administered 2015-08-10: 500 [IU] via INTRAVENOUS
  Filled 2015-08-10: qty 5

## 2015-08-10 NOTE — Patient Instructions (Signed)

## 2015-08-20 ENCOUNTER — Other Ambulatory Visit: Payer: Self-pay | Admitting: Oncology

## 2015-08-20 DIAGNOSIS — D508 Other iron deficiency anemias: Secondary | ICD-10-CM

## 2015-08-20 DIAGNOSIS — C482 Malignant neoplasm of peritoneum, unspecified: Secondary | ICD-10-CM

## 2015-08-21 ENCOUNTER — Ambulatory Visit (HOSPITAL_BASED_OUTPATIENT_CLINIC_OR_DEPARTMENT_OTHER): Payer: Medicare Other | Admitting: Oncology

## 2015-08-21 ENCOUNTER — Ambulatory Visit: Payer: Medicare Other

## 2015-08-21 ENCOUNTER — Telehealth: Payer: Self-pay | Admitting: Oncology

## 2015-08-21 ENCOUNTER — Encounter: Payer: Self-pay | Admitting: Oncology

## 2015-08-21 ENCOUNTER — Other Ambulatory Visit (HOSPITAL_BASED_OUTPATIENT_CLINIC_OR_DEPARTMENT_OTHER): Payer: Medicare Other

## 2015-08-21 VITALS — BP 125/60 | HR 63 | Temp 98.0°F | Resp 18 | Ht 61.5 in | Wt 198.5 lb

## 2015-08-21 DIAGNOSIS — E119 Type 2 diabetes mellitus without complications: Secondary | ICD-10-CM | POA: Diagnosis not present

## 2015-08-21 DIAGNOSIS — Z95828 Presence of other vascular implants and grafts: Secondary | ICD-10-CM

## 2015-08-21 DIAGNOSIS — G622 Polyneuropathy due to other toxic agents: Secondary | ICD-10-CM | POA: Diagnosis not present

## 2015-08-21 DIAGNOSIS — R978 Other abnormal tumor markers: Secondary | ICD-10-CM

## 2015-08-21 DIAGNOSIS — N393 Stress incontinence (female) (male): Secondary | ICD-10-CM | POA: Diagnosis not present

## 2015-08-21 DIAGNOSIS — G2581 Restless legs syndrome: Secondary | ICD-10-CM | POA: Diagnosis not present

## 2015-08-21 DIAGNOSIS — D696 Thrombocytopenia, unspecified: Secondary | ICD-10-CM | POA: Diagnosis not present

## 2015-08-21 DIAGNOSIS — K7460 Unspecified cirrhosis of liver: Secondary | ICD-10-CM | POA: Diagnosis not present

## 2015-08-21 DIAGNOSIS — D509 Iron deficiency anemia, unspecified: Secondary | ICD-10-CM

## 2015-08-21 DIAGNOSIS — Z6837 Body mass index (BMI) 37.0-37.9, adult: Secondary | ICD-10-CM

## 2015-08-21 DIAGNOSIS — D508 Other iron deficiency anemias: Secondary | ICD-10-CM

## 2015-08-21 DIAGNOSIS — R161 Splenomegaly, not elsewhere classified: Secondary | ICD-10-CM

## 2015-08-21 DIAGNOSIS — C482 Malignant neoplasm of peritoneum, unspecified: Secondary | ICD-10-CM | POA: Diagnosis present

## 2015-08-21 LAB — COMPREHENSIVE METABOLIC PANEL
ALT: 93 U/L — ABNORMAL HIGH (ref 0–55)
AST: 71 U/L — ABNORMAL HIGH (ref 5–34)
Albumin: 3.7 g/dL (ref 3.5–5.0)
Alkaline Phosphatase: 105 U/L (ref 40–150)
Anion Gap: 12 mEq/L — ABNORMAL HIGH (ref 3–11)
BUN: 12.5 mg/dL (ref 7.0–26.0)
CO2: 21 mEq/L — ABNORMAL LOW (ref 22–29)
Calcium: 8.8 mg/dL (ref 8.4–10.4)
Chloride: 108 mEq/L (ref 98–109)
Creatinine: 0.8 mg/dL (ref 0.6–1.1)
EGFR: 71 mL/min/{1.73_m2} — ABNORMAL LOW (ref >90)
Glucose: 226 mg/dl — ABNORMAL HIGH (ref 70–140)
Potassium: 4.1 mEq/L (ref 3.5–5.1)
Sodium: 141 mEq/L (ref 136–145)
Total Bilirubin: 0.69 mg/dL (ref 0.20–1.20)
Total Protein: 7.2 g/dL (ref 6.4–8.3)

## 2015-08-21 LAB — CBC WITH DIFFERENTIAL/PLATELET
BASO%: 0.9 % (ref 0.0–2.0)
Basophils Absolute: 0 10*3/uL (ref 0.0–0.1)
EOS%: 1.9 % (ref 0.0–7.0)
Eosinophils Absolute: 0.1 10*3/uL (ref 0.0–0.5)
HCT: 33.2 % — ABNORMAL LOW (ref 34.8–46.6)
HGB: 10.5 g/dL — ABNORMAL LOW (ref 11.6–15.9)
LYMPH%: 23.9 % (ref 14.0–49.7)
MCH: 26.1 pg (ref 25.1–34.0)
MCHC: 31.5 g/dL (ref 31.5–36.0)
MCV: 82.9 fL (ref 79.5–101.0)
MONO#: 0.2 10*3/uL (ref 0.1–0.9)
MONO%: 4.7 % (ref 0.0–14.0)
NEUT#: 3 10*3/uL (ref 1.5–6.5)
NEUT%: 68.6 % (ref 38.4–76.8)
Platelets: 100 10*3/uL — ABNORMAL LOW (ref 145–400)
RBC: 4 10*6/uL (ref 3.70–5.45)
RDW: 23 % — ABNORMAL HIGH (ref 11.2–14.5)
WBC: 4.3 10*3/uL (ref 3.9–10.3)
lymph#: 1 10*3/uL (ref 0.9–3.3)

## 2015-08-21 MED ORDER — SODIUM CHLORIDE 0.9 % IJ SOLN
10.0000 mL | INTRAMUSCULAR | Status: DC | PRN
  Administered 2015-08-21: 10 mL via INTRAVENOUS
  Filled 2015-08-21: qty 10

## 2015-08-21 MED ORDER — HEPARIN SOD (PORK) LOCK FLUSH 100 UNIT/ML IV SOLN
500.0000 [IU] | Freq: Once | INTRAVENOUS | Status: AC | PRN
  Administered 2015-08-21: 500 [IU] via INTRAVENOUS
  Filled 2015-08-21: qty 5

## 2015-08-21 NOTE — Patient Instructions (Signed)

## 2015-08-21 NOTE — Progress Notes (Signed)
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 Last imaging was CT AP 03-22-15, done due to slight rise in CA 125, with no identifiable recurrent disease then. She saw Dr Denman George on 05-31-15, next scheduled 11-20-15.  Patient had IV feraheme on 08-10-15, second dose upcoming. The IV iron is necessary as oral iron has not resolved iron deficiency, which is thought contributing to severe restless legs. Patient had no difficulty with first feraheme, thinks she may have felt some better with this. Patient has been on Januvia x 1 week by PCP, with improvement in blood sugars from 300s to 180 fasting this AM.  She is on rotigotine patch by Dr Tat and has had some improvement in degenerative back symptoms with regular chiropractic visits.  She had gastroenteritis in 07-2015, now appetite back to baseline and bowels moving regularly. No abdominal or pelvic pain. No LE swelling. No bleeding. No increased SOB. No problems with PAC. No taste since chemo, tells me tongue "feels slippery" which may be decreased papillae with iron deficiency Wearing FitBit, usually 4-5K steps daily Remainder of 10 point Review of Systems negative.    PAC flushed today (03-27-15)_ Genetics testing from 02-2014 normal ([employer] panel) CA 125 1195 at diagnosis. 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 Urine culture by PCP showed no infection and she was referred to Dr Clois Comber, who adjusted medications with great improvement in the cough. Despite improved cough, she continued with some urinary incontinence. She  saw Dr Ronita Hipps in 10-2013, with pelvic mass on exam and CA 125 1195 on 11-08-13. She had CT AP 11-12-13, which showed large ascites, irregular and shrunken liver, mild splenomegaly, increased soft tissue in pelvis bilaterally and anterior mid and lower peritoneum, small left pleural effusion. She saw Dr Denman George in consultation on 11-15-13, who agreed that this was likely gyn malignancy. She had US paracentesis of 4.1 liters on 11-18-13, with cytology ([id number]) and immunohistochemical stains consistent with serous carcinoma of Mullerian tract primary, favor ovarian, ER patchy positive. She had second paracentesis for 3.6 liters on 12-03-13. Gyn oncology recommended initial treatment with chemotherapy. She had first carboplatin and taxol on 12-07-13, with carboplatin dosed at AUC=4 and taxol at 135 mg/m2 due to concern for cirrhosis. She has needed gCSF support and even so was admitted with febrile neutropenia after cycle 3. CA125 was 1938 on 12-01-13 as baseline for chemotherapy, down to 130 prior to cycle 4 and 45 after cycle 5. 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 Chemo resumed 04-19-14, regimen changed to dose dense carbo taxol with granix days 2,3,9,16. She tolerated the dose dense regimen better overall, tho cycle 3 days 8 and 15 were held with neutropenia, symptomatic anemia and fatigue. CT AP 07-04-14 had no obvious recurrent or persistent cancer. She did not go onto GOG 0225 due to [facility] research limitations when she was eligible.    Review of systems as above, also:  Remainder of 10 point Review of Systems negative.  Objective:  Vital signs  in last 24 hours:  BP 125/60 mmHg  Pulse 63  Temp(Src) 98 F (36.7 C) (Oral)  Resp 18  Ht 5' 1.5" (1.562 m)  Wt 198 lb 8 oz (90.039 kg)  BMI 36.90 kg/m2  SpO2 99% Weight down 5 lbs from  05-2015 Alert, oriented and appropriate. Ambulatory without difficulty. Looks more comfortable and brighter than I have seen her. Respirations not labored RA  HEENT:PERRL, sclerae not icteric. Oral mucosa moist without lesions, posterior pharynx clear.  Neck supple. No JVD.  Lymphatics:no cervical,supraclavicular, axillary or inguinal adenopathy Resp: clear to auscultation bilaterally and normal percussion bilaterally Cardio: regular rate and rhythm. No gallop. GI: abdomen obese, soft, nontender, not distended, no mass or organomegaly. Normally active bowel sounds. Surgical incision not remarkable. Musculoskeletal/ Extremities: without pitting edema, cords, tenderness Neuro: peripheral neuropathy unchanged. No other focal deficits. Psych appropriate mood and affect Skin without rash, ecchymosis, petechiae Portacath-without erythema or tenderness  Lab Results:  Results for orders placed or performed in visit on 08/21/15  CBC with Differential  Result Value Ref Range   WBC 4.3 3.9 - 10.3 10e3/uL   NEUT# 3.0 1.5 - 6.5 10e3/uL   HGB 10.5 (L) 11.6 - 15.9 g/dL   HCT 33.2 (L) 34.8 - 46.6 %   Platelets 100 (L) 145 - 400 10e3/uL   MCV 82.9 79.5 - 101.0 fL   MCH 26.1 25.1 - 34.0 pg   MCHC 31.5 31.5 - 36.0 g/dL   RBC 4.00 3.70 - 5.45 10e6/uL   RDW 23.0 (H) 11.2 - 14.5 %   lymph# 1.0 0.9 - 3.3 10e3/uL   MONO# 0.2 0.1 - 0.9 10e3/uL   Eosinophils Absolute 0.1 0.0 - 0.5 10e3/uL   Basophils Absolute 0.0 0.0 - 0.1 10e3/uL   NEUT% 68.6 38.4 - 76.8 %   LYMPH% 23.9 14.0 - 49.7 %   MONO% 4.7 0.0 - 14.0 %   EOS% 1.9 0.0 - 7.0 %   BASO% 0.9 0.0 - 2.0 %  Comprehensive metabolic panel  Result Value Ref Range   Sodium 141 136 - 145 mEq/L   Potassium 4.1 3.5 - 5.1 mEq/L   Chloride 108 98 - 109 mEq/L   CO2 21 (L) 22 - 29 mEq/L   Glucose 226 (H) 70 - 140 mg/dl   BUN 12.5 7.0 - 26.0 mg/dL   Creatinine 0.8 0.6 - 1.1 mg/dL   Total Bilirubin 0.69 0.20 - 1.20 mg/dL   Alkaline Phosphatase 105 40 - 150  U/L   AST 71 (H) 5 - 34 U/L   ALT 93 (H) 0 - 55 U/L   Total Protein 7.2 6.4 - 8.3 g/dL   Albumin 3.7 3.5 - 5.0 g/dL   Calcium 8.8 8.4 - 10.4 mg/dL   Anion Gap 12 (H) 3 - 11 mEq/L   EGFR 71 (L) >90 ml/min/1.73 m2   CA 125 available after visit now 45  This having been 20 on 02-27-15, 25.3 on 03-27-15 and 29.9 on 05-29-15  Iron studies 07-28-15 had ferritin 10, serum iron 29, %sat 6  Studies/Results:  No results found.  Medications: I have reviewed the patient's current medications. Will give second Feraheme on 08-25-15, order under sign and held  DISCUSSION Clinically seems to be doing better with management of multiple problems, however CA 125 more elevated when resulted after visit. Will be in touch with patient, either repeat lab in ~ 4 weeks with scans if not better, vs scans again now.   She is in agreement with second  feraheme upcoming.  DMV handicapped form completed for patient now.   Assessment/Plan: 1.high grade serous primary peritoneal carcinoma: Neoadjuvant chemotherapy with carboplatin and taxol 12-07-13 thru cycle 5 on 03-08-14. Interval optimal debulking of what was still large amount of disease, with residual TNTC <27m tumor areas. Additional chemo given x 3 cycles after debulking, thru 43-60-67 complicated by cytopenias and neuropathy. Repeat CT 03-22-15 no obvious recurrence and no symptoms, however marker higher today. Repeat scans at least if marker stays up in 4-6 weeks.  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 Liver function tests stable thru chemo. Known to Dr MLucio Edward 3.PAC in: Need to flush every 6-8 weeks if not otherwise used. Keep due to very poor peripheral access and high risk for recurrent cancer. 4.diabetes on oral agent, has been following some blood sugars at home  5.stress urinary incontinence no worse 6.chronic cough: resolved with medications by Dr WMelvyn Novas Past tobacco 7. 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 Tapered somewhat but intolerable hot flashes when lower than 1/2 tab daily. Dr RDenman Georgeis comfortable with her continuing the Estrace for now 11.narrow angle glaucoma: following with ophthalmology 12.restless legs: severe, chronically interferes with sleep. Medications helping somewhat, Dr Tat also felt this related to iron deficiency; will give second feraheme later this week.  13.chemo peripheral neuropathy + baseline neuropathy in feet thought related to DM, also thecal sac compression 14.chronic orthopedic problems including back. Known to Dr GGladstone Lighter15.obesity,  BMI 37   All questions answered and she knows we will talk with her by phone with CA 125 results. TIme spent 25 min including >50% counseling and coordination of care. Route PCP, cc Dr RDenman George Dr Tat    LGordy Levan MD   08/21/2015, 10:30 AM

## 2015-08-21 NOTE — Telephone Encounter (Signed)
appt made and avs printed °

## 2015-08-22 LAB — CA 125: Cancer Antigen (CA) 125: 45 U/mL — ABNORMAL HIGH (ref 0.0–38.1)

## 2015-08-24 ENCOUNTER — Telehealth: Payer: Self-pay | Admitting: Oncology

## 2015-08-24 ENCOUNTER — Other Ambulatory Visit: Payer: Self-pay | Admitting: Oncology

## 2015-08-24 DIAGNOSIS — C482 Malignant neoplasm of peritoneum, unspecified: Secondary | ICD-10-CM

## 2015-08-24 NOTE — Telephone Encounter (Signed)
Medical Oncology  MD spoke with patient now to let her know that CA 125 marker is somewhat more elevated, now 45. She is feeling very well.  She had no obvious recurrent disease by CT AP 03-2015 (tho cirrhosis etc).  Told patient that we should either repeat marker in 4-6 weeks, or get scans now. She is in agreement with repeating marker in 4-6 weeks, would need scans after that if still elevated.  Request to schedulers.  Godfrey Pick, MD

## 2015-08-25 ENCOUNTER — Ambulatory Visit (HOSPITAL_BASED_OUTPATIENT_CLINIC_OR_DEPARTMENT_OTHER): Payer: Medicare Other

## 2015-08-25 VITALS — BP 146/79 | HR 61 | Temp 98.7°F | Resp 18

## 2015-08-25 DIAGNOSIS — D509 Iron deficiency anemia, unspecified: Secondary | ICD-10-CM

## 2015-08-25 DIAGNOSIS — D508 Other iron deficiency anemias: Secondary | ICD-10-CM

## 2015-08-25 DIAGNOSIS — Z95828 Presence of other vascular implants and grafts: Secondary | ICD-10-CM

## 2015-08-25 MED ORDER — HEPARIN SOD (PORK) LOCK FLUSH 100 UNIT/ML IV SOLN
500.0000 [IU] | Freq: Once | INTRAVENOUS | Status: AC | PRN
  Administered 2015-08-25: 500 [IU] via INTRAVENOUS
  Filled 2015-08-25: qty 5

## 2015-08-25 MED ORDER — SODIUM CHLORIDE 0.9 % IV SOLN
Freq: Once | INTRAVENOUS | Status: AC
  Administered 2015-08-25: 08:00:00 via INTRAVENOUS

## 2015-08-25 MED ORDER — SODIUM CHLORIDE 0.9 % IJ SOLN
10.0000 mL | INTRAMUSCULAR | Status: DC | PRN
  Administered 2015-08-25: 10 mL via INTRAVENOUS
  Filled 2015-08-25: qty 10

## 2015-08-25 MED ORDER — SODIUM CHLORIDE 0.9 % IV SOLN
510.0000 mg | Freq: Once | INTRAVENOUS | Status: AC
  Administered 2015-08-25: 510 mg via INTRAVENOUS
  Filled 2015-08-25: qty 17

## 2015-08-25 NOTE — Patient Instructions (Signed)

## 2015-09-18 ENCOUNTER — Ambulatory Visit: Payer: Medicare Other

## 2015-09-18 ENCOUNTER — Ambulatory Visit: Payer: Medicare Other | Admitting: Oncology

## 2015-09-18 ENCOUNTER — Other Ambulatory Visit: Payer: Medicare Other

## 2015-09-18 ENCOUNTER — Telehealth: Payer: Self-pay | Admitting: Oncology

## 2015-09-18 ENCOUNTER — Ambulatory Visit (HOSPITAL_BASED_OUTPATIENT_CLINIC_OR_DEPARTMENT_OTHER): Payer: Medicare Other

## 2015-09-18 DIAGNOSIS — C482 Malignant neoplasm of peritoneum, unspecified: Secondary | ICD-10-CM

## 2015-09-18 DIAGNOSIS — Z452 Encounter for adjustment and management of vascular access device: Secondary | ICD-10-CM

## 2015-09-18 DIAGNOSIS — Z95828 Presence of other vascular implants and grafts: Secondary | ICD-10-CM

## 2015-09-18 MED ORDER — HEPARIN SOD (PORK) LOCK FLUSH 100 UNIT/ML IV SOLN
500.0000 [IU] | Freq: Once | INTRAVENOUS | Status: AC | PRN
  Administered 2015-09-18: 500 [IU] via INTRAVENOUS
  Filled 2015-09-18: qty 5

## 2015-09-18 MED ORDER — SODIUM CHLORIDE 0.9 % IJ SOLN
10.0000 mL | INTRAMUSCULAR | Status: DC | PRN
  Administered 2015-09-18: 10 mL via INTRAVENOUS
  Filled 2015-09-18: qty 10

## 2015-09-18 NOTE — Patient Instructions (Signed)

## 2015-09-18 NOTE — Telephone Encounter (Signed)
per pof to sch pt appt-gave pt copy of avs °

## 2015-09-19 LAB — CA 125: Cancer Antigen (CA) 125: 47 U/mL — ABNORMAL HIGH (ref 0.0–38.1)

## 2015-09-20 ENCOUNTER — Other Ambulatory Visit: Payer: Self-pay | Admitting: Oncology

## 2015-09-20 ENCOUNTER — Telehealth: Payer: Self-pay

## 2015-09-20 ENCOUNTER — Encounter: Payer: Self-pay | Admitting: *Deleted

## 2015-09-20 ENCOUNTER — Telehealth: Payer: Self-pay | Admitting: Oncology

## 2015-09-20 DIAGNOSIS — C482 Malignant neoplasm of peritoneum, unspecified: Secondary | ICD-10-CM

## 2015-09-20 NOTE — Telephone Encounter (Signed)
-----   Message from Gordy Levan, MD sent at 09/20/2015  7:13 AM EDT ----- Labs seen and need follow up: please let her know that the marker still a bit elevated, tho stable from a month ago. I will get scans and see her back in next 1-2 weeks, POF sent and orders in   thanks

## 2015-09-20 NOTE — Telephone Encounter (Signed)
Spoke with pt to confirm 8/17 appt date/times per LL los

## 2015-09-20 NOTE — Telephone Encounter (Signed)
Told Olivia Benson the results of the CA-125, CT scan, and  follow up visit ordered as noted below by Dr. Marko Plume.

## 2015-09-27 ENCOUNTER — Encounter (HOSPITAL_COMMUNITY): Payer: Self-pay

## 2015-09-27 ENCOUNTER — Ambulatory Visit (HOSPITAL_COMMUNITY)
Admission: RE | Admit: 2015-09-27 | Discharge: 2015-09-27 | Disposition: A | Discharge status: Home / Self Care | Payer: Medicare Other | Source: Ambulatory Visit | Attending: Oncology | Admitting: Oncology

## 2015-09-27 DIAGNOSIS — R161 Splenomegaly, not elsewhere classified: Secondary | ICD-10-CM | POA: Diagnosis not present

## 2015-09-27 DIAGNOSIS — K668 Other specified disorders of peritoneum: Secondary | ICD-10-CM | POA: Insufficient documentation

## 2015-09-27 DIAGNOSIS — R590 Localized enlarged lymph nodes: Secondary | ICD-10-CM | POA: Insufficient documentation

## 2015-09-27 DIAGNOSIS — K7460 Unspecified cirrhosis of liver: Secondary | ICD-10-CM | POA: Insufficient documentation

## 2015-09-27 DIAGNOSIS — C482 Malignant neoplasm of peritoneum, unspecified: Secondary | ICD-10-CM | POA: Diagnosis not present

## 2015-09-27 MED ORDER — DIATRIZOATE MEGLUMINE & SODIUM 66-10 % PO SOLN: 30.0000 mL | Freq: Once | ORAL | Status: DC

## 2015-09-27 MED ORDER — IOPAMIDOL (ISOVUE-300) INJECTION 61%
100.0000 mL | Freq: Once | INTRAVENOUS | Status: AC | PRN
  Administered 2015-09-27: 100 mL via INTRAVENOUS

## 2015-09-28 ENCOUNTER — Other Ambulatory Visit: Payer: Self-pay | Admitting: Oncology

## 2015-09-28 ENCOUNTER — Encounter: Payer: Self-pay | Admitting: Oncology

## 2015-09-28 ENCOUNTER — Ambulatory Visit (HOSPITAL_BASED_OUTPATIENT_CLINIC_OR_DEPARTMENT_OTHER): Payer: Medicare Other | Admitting: Oncology

## 2015-09-28 ENCOUNTER — Other Ambulatory Visit (HOSPITAL_BASED_OUTPATIENT_CLINIC_OR_DEPARTMENT_OTHER): Payer: Medicare Other

## 2015-09-28 ENCOUNTER — Ambulatory Visit: Payer: Medicare Other

## 2015-09-28 VITALS — BP 144/56 | HR 64 | Temp 98.0°F | Resp 18 | Wt 196.8 lb

## 2015-09-28 DIAGNOSIS — C482 Malignant neoplasm of peritoneum, unspecified: Secondary | ICD-10-CM | POA: Diagnosis present

## 2015-09-28 DIAGNOSIS — T451X5A Adverse effect of antineoplastic and immunosuppressive drugs, initial encounter: Secondary | ICD-10-CM

## 2015-09-28 DIAGNOSIS — R978 Other abnormal tumor markers: Secondary | ICD-10-CM

## 2015-09-28 DIAGNOSIS — R599 Enlarged lymph nodes, unspecified: Secondary | ICD-10-CM | POA: Diagnosis not present

## 2015-09-28 DIAGNOSIS — K7460 Unspecified cirrhosis of liver: Secondary | ICD-10-CM | POA: Diagnosis not present

## 2015-09-28 DIAGNOSIS — E669 Obesity, unspecified: Secondary | ICD-10-CM | POA: Diagnosis not present

## 2015-09-28 DIAGNOSIS — Z95828 Presence of other vascular implants and grafts: Secondary | ICD-10-CM

## 2015-09-28 DIAGNOSIS — K6389 Other specified diseases of intestine: Secondary | ICD-10-CM

## 2015-09-28 DIAGNOSIS — D696 Thrombocytopenia, unspecified: Secondary | ICD-10-CM | POA: Diagnosis not present

## 2015-09-28 DIAGNOSIS — G622 Polyneuropathy due to other toxic agents: Secondary | ICD-10-CM

## 2015-09-28 DIAGNOSIS — R161 Splenomegaly, not elsewhere classified: Secondary | ICD-10-CM | POA: Diagnosis not present

## 2015-09-28 DIAGNOSIS — Z6837 Body mass index (BMI) 37.0-37.9, adult: Secondary | ICD-10-CM | POA: Diagnosis not present

## 2015-09-28 DIAGNOSIS — D509 Iron deficiency anemia, unspecified: Secondary | ICD-10-CM

## 2015-09-28 DIAGNOSIS — E119 Type 2 diabetes mellitus without complications: Secondary | ICD-10-CM | POA: Diagnosis not present

## 2015-09-28 DIAGNOSIS — G620 Drug-induced polyneuropathy: Secondary | ICD-10-CM

## 2015-09-28 LAB — CBC WITH DIFFERENTIAL/PLATELET
BASO%: 0.5 % (ref 0.0–2.0)
Basophils Absolute: 0 10*3/uL (ref 0.0–0.1)
EOS%: 1.8 % (ref 0.0–7.0)
Eosinophils Absolute: 0.1 10*3/uL (ref 0.0–0.5)
HCT: 37.8 % (ref 34.8–46.6)
HGB: 12.1 g/dL (ref 11.6–15.9)
LYMPH%: 23.4 % (ref 14.0–49.7)
MCH: 28.5 pg (ref 25.1–34.0)
MCHC: 32.1 g/dL (ref 31.5–36.0)
MCV: 88.8 fL (ref 79.5–101.0)
MONO#: 0.2 10*3/uL (ref 0.1–0.9)
MONO%: 4.7 % (ref 0.0–14.0)
NEUT#: 3.6 10*3/uL (ref 1.5–6.5)
NEUT%: 69.6 % (ref 38.4–76.8)
Platelets: 128 10*3/uL — ABNORMAL LOW (ref 145–400)
RBC: 4.25 10*6/uL (ref 3.70–5.45)
RDW: 24.3 % — ABNORMAL HIGH (ref 11.2–14.5)
WBC: 5.2 10*3/uL (ref 3.9–10.3)
lymph#: 1.2 10*3/uL (ref 0.9–3.3)

## 2015-09-28 LAB — COMPREHENSIVE METABOLIC PANEL
ALT: 112 U/L — ABNORMAL HIGH (ref 0–55)
AST: 80 U/L — ABNORMAL HIGH (ref 5–34)
Albumin: 3.9 g/dL (ref 3.5–5.0)
Alkaline Phosphatase: 111 U/L (ref 40–150)
Anion Gap: 8 mEq/L (ref 3–11)
BUN: 12.3 mg/dL (ref 7.0–26.0)
CO2: 25 mEq/L (ref 22–29)
Calcium: 9 mg/dL (ref 8.4–10.4)
Chloride: 107 mEq/L (ref 98–109)
Creatinine: 0.8 mg/dL (ref 0.6–1.1)
EGFR: 76 mL/min/{1.73_m2} — ABNORMAL LOW (ref >90)
Glucose: 220 mg/dl — ABNORMAL HIGH (ref 70–140)
Potassium: 4.1 mEq/L (ref 3.5–5.1)
Sodium: 140 mEq/L (ref 136–145)
Total Bilirubin: 0.45 mg/dL (ref 0.20–1.20)
Total Protein: 7.5 g/dL (ref 6.4–8.3)

## 2015-09-28 MED ORDER — SODIUM CHLORIDE 0.9 % IJ SOLN
10.0000 mL | INTRAMUSCULAR | Status: DC | PRN
  Administered 2015-09-28: 10 mL via INTRAVENOUS
  Filled 2015-09-28: qty 10

## 2015-09-28 MED ORDER — HEPARIN SOD (PORK) LOCK FLUSH 100 UNIT/ML IV SOLN
500.0000 [IU] | Freq: Once | INTRAVENOUS | Status: AC | PRN
  Administered 2015-09-28: 500 [IU] via INTRAVENOUS
  Filled 2015-09-28: qty 5

## 2015-09-28 NOTE — Progress Notes (Signed)
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 She has been on observation since completing chemotherapy 05-30-14. With CA 125 marker gradually increased, CT AP 09-27-15 shows new small bowel mesenteric nodule and increase in left external iliac lymph node.  Patient saw Dr Denman George last on 05-31-15; this information will be communicated to Dr Denman George.  Patient remains asymptomatic from standpoint of likely recurrence of gyn carcinoma. She denies abdominal or pelvic pain, GI symptoms, SOB, bleeding, LE swelling, problems with PAC. Energy is reasonable for present activities. Taste for some foods not correct since chemo. She has peripheral neuropathy in feet which is in part due to taxanes. 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  She is now seeing chiropractor weekly, with some improvement in back symptoms. She is sleeping a little more since improvement in back and restless legs.  PAC flushed 09-28-15 Genetics testing from 02-2014 normal ([employer] panel) CA 125 1195 at diagnosis. ER + on cytology 11-18-13 NASH cirrhosis with splenomegaly   ONCOLOGIC HISTORY Patient initially had problems in ~ July 2015 with stress urinary incontinence related to severe chronic cough. Urine culture by PCP showed no infection and she was referred to Dr Clois Comber, who adjusted medications with great improvement in the cough. Despite improved cough, she continued with some urinary incontinence. She saw Dr  Ronita Hipps in 10-2013, with pelvic mass on exam and CA 125 1195 on 11-08-13. She had CT AP 11-12-13, which showed large ascites, irregular and shrunken liver, mild splenomegaly, increased soft tissue in pelvis bilaterally and anterior mid and lower peritoneum, small left pleural effusion. She saw Dr Denman George in consultation on 11-15-13, who agreed that this was likely gyn malignancy. She had US paracentesis of 4.1 liters on 11-18-13, with cytology ([id number]) and immunohistochemical stains consistent with serous carcinoma of Mullerian tract primary, favor ovarian, ER patchy positive. She had second paracentesis for 3.6 liters on 12-03-13. Gyn oncology recommended initial treatment with chemotherapy. She had first carboplatin and taxol on 12-07-13, with carboplatin dosed at AUC=4 and taxol at 135 mg/m2 due to concern for cirrhosis. She has needed gCSF support and even so was admitted with febrile neutropenia after cycle 3. CA125 was 1938 on 12-01-13 as baseline for chemotherapy, down to 130 prior to cycle 4 and 45 after cycle 5. 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 Chemo resumed 04-19-14, regimen changed to dose dense carbo taxol with granix days 2,3,9,16. She tolerated the dose dense regimen better overall, tho cycle 3 days 8 and 15 were held with neutropenia, symptomatic anemia and fatigue. CT AP 07-04-14 had no obvious recurrent or persistent cancer. She did not go onto GOG 0225 due to [facility] research limitations when she was eligible. CA 125 was 45 in 08-2015 and 47 in 09-2015. CT AP 09-27-15 showed a new small bowel mesenteric nodule 1 cm and a left external iliac node now 1.2 cm compared with 5 mm  in 03-2015.    Objective:  Vital signs in last 24 hours:  BP (!) 144/56 (BP Location: Left Arm, Patient Position: Sitting)   Pulse 64   Temp 98 F (36.7  C) (Oral)   Resp 18   Wt 196 lb 12.8 oz (89.3 kg)   SpO2 99%   BMI 36.58 kg/m  Weight down 1 lb. Looks comfortable seated in exam room Alert, oriented and appropriate. Ambulatory without assistance     HEENT:PERRL, sclerae not icteric. Oral mucosa moist without lesions,  Lymphatics:no suraclavicular adenopathy Resp: clear to auscultation bilaterally  Cardio: regular rate and rhythm. No gallop. GI: soft, nontender, not distended. Normally active bowel sounds.  Musculoskeletal/ Extremities: LE without pitting edema, cords, tenderness Neuro: peripheral neuropathy feet unchanged. Otherwise nonfocal. PSYCH appropriate mood and affect Skin without rash, ecchymosis, petechiae Portacath-without erythema or tenderness  Lab Results:  Results for orders placed or performed in visit on 09/28/15  CBC with Differential  Result Value Ref Range   WBC 5.2 3.9 - 10.3 10e3/uL   NEUT# 3.6 1.5 - 6.5 10e3/uL   HGB 12.1 11.6 - 15.9 g/dL   HCT 37.8 34.8 - 46.6 %   Platelets 128 (L) 145 - 400 10e3/uL   MCV 88.8 79.5 - 101.0 fL   MCH 28.5 25.1 - 34.0 pg   MCHC 32.1 31.5 - 36.0 g/dL   RBC 4.25 3.70 - 5.45 10e6/uL   RDW 24.3 (H) 11.2 - 14.5 %   lymph# 1.2 0.9 - 3.3 10e3/uL   MONO# 0.2 0.1 - 0.9 10e3/uL   Eosinophils Absolute 0.1 0.0 - 0.5 10e3/uL   Basophils Absolute 0.0 0.0 - 0.1 10e3/uL   NEUT% 69.6 38.4 - 76.8 %   LYMPH% 23.4 14.0 - 49.7 %   MONO% 4.7 0.0 - 14.0 %   EOS% 1.8 0.0 - 7.0 %   BASO% 0.5 0.0 - 2.0 %  Comprehensive metabolic panel  Result Value Ref Range   Sodium 140 136 - 145 mEq/L   Potassium 4.1 3.5 - 5.1 mEq/L   Chloride 107 98 - 109 mEq/L   CO2 25 22 - 29 mEq/L   Glucose 220 (H) 70 - 140 mg/dl   BUN 12.3 7.0 - 26.0 mg/dL   Creatinine 0.8 0.6 - 1.1 mg/dL   Total Bilirubin 0.45 0.20 - 1.20 mg/dL   Alkaline Phosphatase 111 40 - 150 U/L   AST 80 (H) 5 - 34 U/L   ALT 112 (H) 0 - 55 U/L   Total Protein 7.5 6.4 - 8.3 g/dL   Albumin 3.9 3.5 - 5.0 g/dL   Calcium 9.0 8.4 -  10.4 mg/dL   Anion Gap 8 3 - 11 mEq/L   EGFR 76 (L) >90 ml/min/1.73 m2     Studies/Results:  Ct Abdomen Pelvis W Contrast  Result Date: 09/27/2015 CLINICAL DATA:  Ovarian cancer, chemotherapy complete. Rising CA 125. EXAM: CT ABDOMEN AND PELVIS WITH CONTRAST TECHNIQUE: Multidetector CT imaging of the abdomen and pelvis was performed using the standard protocol following bolus administration of intravenous contrast. CONTRAST:  177m ISOVUE-300 IOPAMIDOL (ISOVUE-300) INJECTION 61% COMPARISON:  03/22/2015. FINDINGS: Lower chest: Lung bases show scattered pulmonary parenchymal volume loss/scarring. Catheter terminates in the right atrium. Heart size normal. No pericardial effusion. Hepatobiliary: The liver is decreased in attenuation and the margin is nodular. Liver and gallbladder are otherwise unremarkable. No biliary ductal dilatation. Pancreas: Negative. Spleen: Measures approximately 13.7 cm.  Otherwise negative. Adrenals/Urinary Tract: Adrenal glands and right kidney are unremarkable. Left kidney  is slightly atrophic with scarring in the lateral interpolar region. There may be an associated calyceal diverticulum. Ureters are decompressed. Anterior bladder wall may be tethered to the ventral pelvic wall. Bladder is otherwise unremarkable. Stomach/Bowel: Stomach, small bowel and colon are unremarkable. Appendix not readily visualized. Vascular/Lymphatic: Vascular structures are unremarkable. Retroaortic left renal vein is incidentally noted. Abdominal retroperitoneal lymph nodes are sub cm in short axis size. Left external iliac lymph node measures 12 mm (series 2, image 73), previously 5 mm. Reproductive: Hysterectomy.  No adnexal mass. Other: No free fluid. 10 mm central small bowel mesenteric nodule (series 2, image 57) is new. Minimal soft tissue thickening along the right paracolic gutter, as before. Musculoskeletal: No worrisome lytic or sclerotic lesions. Prominent Schmorl's node in the superior  endplate of L3. IMPRESSION: 1. New small bowel mesenteric nodule and enlarged left external iliac lymph node, highly worrisome for metastatic disease. 2. Cirrhosis and splenomegaly. Electronically Signed   By: Lorin Picket M.D.   On: 09/27/2015 15:24    Medications: I have reviewed the patient's current medications. Note again she is still on Estrace as she felt hot flashes not tolerable without this, note original path ER +.  DISCUSSION CT information discussed with patient and husband and PACs images reviewed. I have told them that this appears to be early evidence of recurrent disease, tho I will also ask Dr Denman George to review. I have told them that generally more surgery would not be beneficial, that path confirmation by biopsy is not always possible or required in appropriate situation, that size of these areas is just at lower limits of PET usefulness.  I have told them that recurrent primary peritoneal carcinoma is treated as a chronic illness rather than for potential cure. Since she is out ~ 16 months from completion of adjuvant chemotherapy, likelihood is that the disease will be platinum sensitive and that we should be able to improve and control situation for some amount of time. She is certainly in better condition now to have systemic treatment than she was at time of initital diagnosis. She understands that I will discuss with Dr Denman George and let her know our recommendations by phone. Patient expresses willingness to have more chemotherapy if that is recommended.  I did not address estrace use now, tho I mentioned newer agents coming available and usefulness of hormone blockers in some situations.     Assessment/Plan:  1.high grade serous primary peritoneal carcinoma: Neoadjuvant chemotherapy with carboplatin and taxol 12-07-13 thru cycle 5 on 03-08-14. Interval optimal debulking of what was still large amount of disease, with residual TNTC <25m tumor areas. Additional chemo given x 3  cycles after debulking, thru 44-66-59 complicated by cytopenias and neuropathy. CA 125 now mid 434swith probable early evidence of recurrence on CT AP 09-27-15. Plan as above 2.hepatic cirrhosis, with mild splenomegaly/ suggestion of portal HTN: likely NASH. Liver function tests stable thru previous chemo. Known to Dr MLucio Edward 3.PAC in: Kept due to very poor peripheral access and high risk for recurrent cancer. 4.diabetes on oral agent, has been following some blood sugars at home  5. 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 Tapered somewhat but intolerable hot flashes when lower than 1/2 tab daily. See above 9..narrow angle glaucoma: following with ophthalmology. Stress urinary incontinence. Chronic cough resolved with interventions by pulmonary. Patst tobaccol 10.restless legs:better with interventions by Dr  Tat and since full IV iron repletion. Dr Doristine Devoid help much appreciated.  11.chemo peripheral neuropathy + baseline neuropathy in feet thought related to DM, also thecal sac compression 12.chronic orthopedic problems including back. Known to Dr Gladstone Lighter. Chiropractic interventions also helping and fine to continue 15.obesity,  BMI 36   All questions answered and patient aware that we will be back in touch by phone. Information sent to gyn oncology. TIme spent 40 min including >50% counseling and coordination of care.  Evlyn Clines, MD   09/28/2015, 7:41 PM

## 2015-09-28 NOTE — Patient Instructions (Signed)

## 2015-10-01 ENCOUNTER — Other Ambulatory Visit: Payer: Self-pay | Admitting: Oncology

## 2015-10-01 DIAGNOSIS — T451X5A Adverse effect of antineoplastic and immunosuppressive drugs, initial encounter: Secondary | ICD-10-CM | POA: Insufficient documentation

## 2015-10-01 DIAGNOSIS — G620 Drug-induced polyneuropathy: Secondary | ICD-10-CM | POA: Insufficient documentation

## 2015-10-01 DIAGNOSIS — R978 Other abnormal tumor markers: Secondary | ICD-10-CM | POA: Insufficient documentation

## 2015-10-03 ENCOUNTER — Other Ambulatory Visit: Payer: Self-pay | Admitting: Oncology

## 2015-10-03 ENCOUNTER — Encounter: Payer: Self-pay | Admitting: Oncology

## 2015-10-03 ENCOUNTER — Telehealth: Payer: Self-pay | Admitting: Oncology

## 2015-10-03 DIAGNOSIS — C482 Malignant neoplasm of peritoneum, unspecified: Secondary | ICD-10-CM

## 2015-10-03 NOTE — Telephone Encounter (Signed)
Medical Oncology  Situation reviewed with Dr Denman George, who agrees with diagnosis of recurrent primary peritoneal carcinoma given findings of scans and marker. She agrees with recommendation to reinstitute chemotherapy with carboplatin and taxane.  Gyn oncology able to move appointment sooner than presently scheduled in 11-2015.  I spoke with patient by phone this afternoon, explaining rationale for recommendation to resume chemotherapy, which will be in attempt to control the recurrent malignancy, but is not expected to be curative.  She understands that further surgery now is not needed. She would like to meet with Dr Denman George sooner than the Oct date presently in EMR, that message sent to gyn onc now.  We have reviewed scheduling for carboplatin and taxol. She prefers every 3 weeks. She would like to have treatments on Mondays, but likely would be flexible with this particularly for cycle 1. She will need antiemetics and steroids to pharmacy, message to RN. She understands that she will lose hair. She hopes to attend a wedding on Sept 9.  She will need dose adjustments and gCSF support given baseline cytopenias, previous chemo and prior history with chemo. She will need carbo test dose prior to each treatment, as she had 6 cycles adjuvantly.  I did not discuss taxotere, tho could consider that instead of taxol if needed ongoing.   Chemo orders placed using [facility] OTHER, as primary peritoneal not one of those diagnoses. Carbo at AUC =4 and taxol at 135 mg/m2. Carbo test dose added. Changed benadryl to claritin, added Emend instead of aloxi, added zofran, prn ativan. Neulasta day 4.   Scheduling message sent for chemo + lab from [facility] ~ 8-28, neulasta 8-31, MD + lab 9-5 Message to p onc manage care to be sure preauth initiated.   Patient knows that she can call if other questions. I have told her clearly that this is not an emergent situation requiring immediate chemotherapy. Patient expressed  appreciation for call and is aware that schedulers will be in touch with her.  Godfrey Pick, MD

## 2015-10-04 ENCOUNTER — Telehealth: Payer: Self-pay

## 2015-10-04 ENCOUNTER — Telehealth: Payer: Self-pay | Admitting: Oncology

## 2015-10-04 DIAGNOSIS — C569 Malignant neoplasm of unspecified ovary: Secondary | ICD-10-CM

## 2015-10-04 MED ORDER — DEXAMETHASONE 4 MG PO TABS: ORAL_TABLET | ORAL | 1 refills | Status: DC

## 2015-10-04 MED ORDER — ONDANSETRON HCL 8 MG PO TABS
8.0000 mg | ORAL_TABLET | Freq: Three times a day (TID) | ORAL | 0 refills | Status: DC | PRN
Start: 2015-10-04 — End: 2015-12-07

## 2015-10-04 MED ORDER — LORAZEPAM 0.5 MG PO TABS: ORAL_TABLET | ORAL | 0 refills | Status: DC

## 2015-10-04 NOTE — Telephone Encounter (Signed)
Sent prescriptions to pharmacy. No answer on cell or home phone at this time.  Will try patient again in the next couple of days to review the [place] skin test.

## 2015-10-04 NOTE — Telephone Encounter (Signed)
-----   Message from Gordy Levan, MD sent at 10/03/2015  5:51 PM EDT ----- Will be starting back on chemo, q 3 week carbo taxol, maybe 8-28. She had those same ones before.     Needs scripts to pharmacy: Decadron 4 mg  Five tabs= 20 mg with food 12 hrs and  6 hrs prior I cannot tell what antiemetics were used before, ? zofran ativan, or fine to do what we used before if that was not it. zofran 8 mg q 8 hrs prn #30 if so Ativan 0.5 mg q 6 hr prn #20 if so  Could you please tell her about carbo skin test, as I do not think I said that on phone  Thank you

## 2015-10-04 NOTE — Telephone Encounter (Signed)
Spoke with pt to confirm 8/29 appt date/times per LOS

## 2015-10-06 ENCOUNTER — Encounter: Payer: Self-pay | Admitting: Oncology

## 2015-10-06 ENCOUNTER — Telehealth: Payer: Self-pay | Admitting: *Deleted

## 2015-10-06 MED ORDER — LIDOCAINE-PRILOCAINE 2.5-2.5 % EX CREA: TOPICAL_CREAM | CUTANEOUS | 1 refills | Status: DC

## 2015-10-06 NOTE — Progress Notes (Signed)
Sent prior auth req for ondansetron and lorazepam via covermymeds

## 2015-10-06 NOTE — Telephone Encounter (Signed)
Sent EMLA cream into patient's pharmacy for [facility].

## 2015-10-06 NOTE — Telephone Encounter (Signed)
Open by mistake

## 2015-10-06 NOTE — Telephone Encounter (Signed)
Told Ms Segoviano about the [place] skin test.  She received this previously she stated.

## 2015-10-09 ENCOUNTER — Encounter: Payer: Self-pay | Admitting: Oncology

## 2015-10-09 NOTE — Progress Notes (Signed)
Left form for dr. Marko Plume to sign for lorazepam prior auth

## 2015-10-10 ENCOUNTER — Other Ambulatory Visit: Payer: Self-pay | Admitting: Oncology

## 2015-10-10 ENCOUNTER — Ambulatory Visit (HOSPITAL_BASED_OUTPATIENT_CLINIC_OR_DEPARTMENT_OTHER): Payer: Medicare Other

## 2015-10-10 ENCOUNTER — Other Ambulatory Visit (HOSPITAL_BASED_OUTPATIENT_CLINIC_OR_DEPARTMENT_OTHER): Payer: Medicare Other

## 2015-10-10 ENCOUNTER — Ambulatory Visit: Payer: Medicare Other

## 2015-10-10 VITALS — BP 145/81 | HR 75 | Temp 97.8°F | Resp 20

## 2015-10-10 DIAGNOSIS — Z5111 Encounter for antineoplastic chemotherapy: Secondary | ICD-10-CM

## 2015-10-10 DIAGNOSIS — C482 Malignant neoplasm of peritoneum, unspecified: Secondary | ICD-10-CM

## 2015-10-10 DIAGNOSIS — Z95828 Presence of other vascular implants and grafts: Secondary | ICD-10-CM

## 2015-10-10 DIAGNOSIS — E119 Type 2 diabetes mellitus without complications: Secondary | ICD-10-CM

## 2015-10-10 LAB — COMPREHENSIVE METABOLIC PANEL
ALT: 125 U/L — ABNORMAL HIGH (ref 0–55)
AST: 70 U/L — ABNORMAL HIGH (ref 5–34)
Albumin: 4 g/dL (ref 3.5–5.0)
Alkaline Phosphatase: 111 U/L (ref 40–150)
Anion Gap: 14 mEq/L — ABNORMAL HIGH (ref 3–11)
BUN: 17.4 mg/dL (ref 7.0–26.0)
CO2: 19 mEq/L — ABNORMAL LOW (ref 22–29)
Calcium: 9.5 mg/dL (ref 8.4–10.4)
Chloride: 106 mEq/L (ref 98–109)
Creatinine: 0.9 mg/dL (ref 0.6–1.1)
EGFR: 66 mL/min/{1.73_m2} — ABNORMAL LOW (ref >90)
Glucose: 317 mg/dl — ABNORMAL HIGH (ref 70–140)
Potassium: 4.5 mEq/L (ref 3.5–5.1)
Sodium: 138 mEq/L (ref 136–145)
Total Bilirubin: 0.62 mg/dL (ref 0.20–1.20)
Total Protein: 7.8 g/dL (ref 6.4–8.3)

## 2015-10-10 LAB — CBC WITH DIFFERENTIAL/PLATELET
BASO%: 0.2 % (ref 0.0–2.0)
Basophils Absolute: 0 10*3/uL (ref 0.0–0.1)
EOS%: 0.1 % (ref 0.0–7.0)
Eosinophils Absolute: 0 10*3/uL (ref 0.0–0.5)
HCT: 41 % (ref 34.8–46.6)
HGB: 13.3 g/dL (ref 11.6–15.9)
LYMPH%: 10.7 % — ABNORMAL LOW (ref 14.0–49.7)
MCH: 29.1 pg (ref 25.1–34.0)
MCHC: 32.4 g/dL (ref 31.5–36.0)
MCV: 89.7 fL (ref 79.5–101.0)
MONO#: 0 10*3/uL — ABNORMAL LOW (ref 0.1–0.9)
MONO%: 0.4 % (ref 0.0–14.0)
NEUT#: 5.1 10*3/uL (ref 1.5–6.5)
NEUT%: 88.6 % — ABNORMAL HIGH (ref 38.4–76.8)
Platelets: 113 10*3/uL — ABNORMAL LOW (ref 145–400)
RBC: 4.57 10*6/uL (ref 3.70–5.45)
RDW: 22.2 % — ABNORMAL HIGH (ref 11.2–14.5)
WBC: 5.7 10*3/uL (ref 3.9–10.3)
lymph#: 0.6 10*3/uL — ABNORMAL LOW (ref 0.9–3.3)

## 2015-10-10 MED ORDER — LORAZEPAM 2 MG/ML IJ SOLN
0.5000 mg | Freq: Once | INTRAMUSCULAR | Status: AC | PRN
  Administered 2015-10-10: 0.5 mg via INTRAVENOUS

## 2015-10-10 MED ORDER — SODIUM CHLORIDE 0.9 % IV SOLN
302.7000 mg | Freq: Once | INTRAVENOUS | Status: AC
  Administered 2015-10-10: 300 mg via INTRAVENOUS
  Filled 2015-10-10: qty 30

## 2015-10-10 MED ORDER — SODIUM CHLORIDE 0.9% FLUSH
10.0000 mL | INTRAVENOUS | Status: DC | PRN
  Administered 2015-10-10: 10 mL
  Filled 2015-10-10: qty 10

## 2015-10-10 MED ORDER — CARBOPLATIN CHEMO INTRADERMAL TEST DOSE 100MCG/0.02ML
100.0000 ug | Freq: Once | INTRADERMAL | Status: AC
  Administered 2015-10-10: 100 ug via INTRADERMAL
  Filled 2015-10-10: qty 0.01

## 2015-10-10 MED ORDER — SODIUM CHLORIDE 0.9 % IV SOLN
Freq: Once | INTRAVENOUS | Status: AC
  Administered 2015-10-10: 11:00:00 via INTRAVENOUS
  Filled 2015-10-10: qty 5

## 2015-10-10 MED ORDER — SODIUM CHLORIDE 0.9 % IJ SOLN
10.0000 mL | INTRAMUSCULAR | Status: DC | PRN
  Administered 2015-10-10: 10 mL via INTRAVENOUS
  Filled 2015-10-10: qty 10

## 2015-10-10 MED ORDER — SODIUM CHLORIDE 0.9 % IV SOLN
Freq: Once | INTRAVENOUS | Status: AC
  Administered 2015-10-10: 11:00:00 via INTRAVENOUS
  Filled 2015-10-10: qty 4

## 2015-10-10 MED ORDER — LORAZEPAM 2 MG/ML IJ SOLN
INTRAMUSCULAR | Status: AC
  Filled 2015-10-10: qty 1

## 2015-10-10 MED ORDER — HEPARIN SOD (PORK) LOCK FLUSH 100 UNIT/ML IV SOLN
500.0000 [IU] | Freq: Once | INTRAVENOUS | Status: AC | PRN
  Administered 2015-10-10: 500 [IU]
  Filled 2015-10-10: qty 5

## 2015-10-10 MED ORDER — INSULIN REGULAR HUMAN 100 UNIT/ML IJ SOLN
2.0000 [IU] | Freq: Once | INTRAMUSCULAR | Status: AC | PRN
  Administered 2015-10-10: 6 [IU] via SUBCUTANEOUS
  Filled 2015-10-10: qty 0.06

## 2015-10-10 MED ORDER — SODIUM CHLORIDE 0.9 % IV SOLN
Freq: Once | INTRAVENOUS | Status: AC
  Administered 2015-10-10: 10:00:00 via INTRAVENOUS

## 2015-10-10 NOTE — Patient Instructions (Signed)
[facility] Discharge Instructions for Patients Receiving Chemotherapy  Today you received the following chemotherapy agents:  Taxol and Carboplatin  To help prevent nausea and vomiting after your treatment, we encourage you to take your nausea medication as ordered per MD.   If you develop nausea and vomiting that is not controlled by your nausea medication, call the clinic.   BELOW ARE SYMPTOMS THAT SHOULD BE REPORTED IMMEDIATELY:  *FEVER GREATER THAN 100.5 F  *CHILLS WITH OR WITHOUT FEVER  NAUSEA AND VOMITING THAT IS NOT CONTROLLED WITH YOUR NAUSEA MEDICATION  *UNUSUAL SHORTNESS OF BREATH  *UNUSUAL BRUISING OR BLEEDING  TENDERNESS IN MOUTH AND THROAT WITH OR WITHOUT PRESENCE OF ULCERS  *URINARY PROBLEMS  *BOWEL PROBLEMS  UNUSUAL RASH Items with * indicate a potential emergency and should be followed up as soon as possible.  Feel free to call the clinic you have any questions or concerns. The clinic phone number is [phone].  Please show the [facility] at check-in to the Emergency Department and triage nurse.

## 2015-10-10 NOTE — Progress Notes (Signed)
Per Dr. Marko Plume, hold Taxol today and give Carboplatin only d/t ALT-125 from today.  SS insulin added per Dr. Marko Plume d/t glucose 317 from today.  Pt notified of MD instructions and has no questions at this time.

## 2015-10-11 ENCOUNTER — Telehealth: Payer: Self-pay

## 2015-10-11 LAB — CA 125: Cancer Antigen (CA) 125: 53.4 U/mL — ABNORMAL HIGH (ref 0.0–38.1)

## 2015-10-11 NOTE — Telephone Encounter (Signed)
-----   Message from Gordy Levan, MD sent at 10/11/2015  8:49 AM EDT ----- Labs seen and need follow up: ok to let her know CA 125 from 8-29 was 53, drawn yesterday with first chemo to give [place] a correct baseline. LFTs were up a little on 8-29 so she had just carbo/ held taxol then. Please tell her again that the [place] can be very helpful itself, tho we will try to add taxol going forward again. She probably will feel better with just the [place] this time, still does need neulasta.   thanks

## 2015-10-11 NOTE — Telephone Encounter (Signed)
Discussed information noted by Dr. Marko Plume regarding CA-125 and LFT and change in treatment for yesterday.  Olivia Benson stated that she is doing fine from treatment. Her Blood sugar is elevated as expected from steroids.  She is pushing fluids. She has not moved bowels today. She will take Miralax tonight. She knows to call [phone] if she has any questions or concerns.

## 2015-10-12 ENCOUNTER — Encounter: Payer: Self-pay | Admitting: Oncology

## 2015-10-12 ENCOUNTER — Ambulatory Visit: Payer: Medicare Other

## 2015-10-12 ENCOUNTER — Ambulatory Visit (HOSPITAL_BASED_OUTPATIENT_CLINIC_OR_DEPARTMENT_OTHER): Payer: Medicare Other

## 2015-10-12 VITALS — BP 140/59 | HR 63 | Temp 98.4°F | Resp 18

## 2015-10-12 DIAGNOSIS — Z5189 Encounter for other specified aftercare: Secondary | ICD-10-CM | POA: Diagnosis not present

## 2015-10-12 DIAGNOSIS — C482 Malignant neoplasm of peritoneum, unspecified: Secondary | ICD-10-CM | POA: Diagnosis present

## 2015-10-12 MED ORDER — PEGFILGRASTIM INJECTION 6 MG/0.6ML ~~LOC~~
6.0000 mg | PREFILLED_SYRINGE | Freq: Once | SUBCUTANEOUS | Status: AC
  Administered 2015-10-12: 6 mg via SUBCUTANEOUS
  Filled 2015-10-12: qty 0.6

## 2015-10-12 NOTE — Progress Notes (Signed)
ondansetron prior auth req via covermymeds-faxed [employer] form  [id number]

## 2015-10-12 NOTE — Patient Instructions (Signed)
Pegfilgrastim injection What is this medicine? PEGFILGRASTIM (PEG fil gra stim) is a long-acting granulocyte colony-stimulating factor that stimulates the growth of neutrophils, a type of white blood cell important in the body's fight against infection. It is used to reduce the incidence of fever and infection in patients with certain types of cancer who are receiving chemotherapy that affects the bone marrow, and to increase survival after being exposed to high doses of radiation. This medicine may be used for other purposes; ask your health care provider or pharmacist if you have questions. What should I tell my health care provider before I take this medicine? They need to know if you have any of these conditions: -kidney disease -latex allergy -ongoing radiation therapy -sickle cell disease -skin reactions to acrylic adhesives (On-Body Injector only) -an unusual or allergic reaction to pegfilgrastim, filgrastim, other medicines, foods, dyes, or preservatives -pregnant or trying to get pregnant -breast-feeding How should I use this medicine? This medicine is for injection under the skin. If you get this medicine at home, you will be taught how to prepare and give the pre-filled syringe or how to use the On-body Injector. Refer to the patient Instructions for Use for detailed instructions. Use exactly as directed. Take your medicine at regular intervals. Do not take your medicine more often than directed. It is important that you put your used needles and syringes in a special sharps container. Do not put them in a trash can. If you do not have a sharps container, call your pharmacist or healthcare provider to get one. Talk to your pediatrician regarding the use of this medicine in children. While this drug may be prescribed for selected conditions, precautions do apply. Overdosage: If you think you have taken too much of this medicine contact a poison control center or emergency room at  once. NOTE: This medicine is only for you. Do not share this medicine with others. What if I miss a dose? It is important not to miss your dose. Call your doctor or health care professional if you miss your dose. If you miss a dose due to an On-body Injector failure or leakage, a new dose should be administered as soon as possible using a single prefilled syringe for manual use. What may interact with this medicine? Interactions have not been studied. Give your health care provider a list of all the medicines, herbs, non-prescription drugs, or dietary supplements you use. Also tell them if you smoke, drink alcohol, or use illegal drugs. Some items may interact with your medicine. This list may not describe all possible interactions. Give your health care provider a list of all the medicines, herbs, non-prescription drugs, or dietary supplements you use. Also tell them if you smoke, drink alcohol, or use illegal drugs. Some items may interact with your medicine. What should I watch for while using this medicine? You may need blood work done while you are taking this medicine. If you are going to need a MRI, CT scan, or other procedure, tell your doctor that you are using this medicine (On-Body Injector only). What side effects may I notice from receiving this medicine? Side effects that you should report to your doctor or health care professional as soon as possible: -allergic reactions like skin rash, itching or hives, swelling of the face, lips, or tongue -dizziness -fever -pain, redness, or irritation at site where injected -pinpoint red spots on the skin -red or dark-brown urine -shortness of breath or breathing problems -stomach or side pain, or pain  at the shoulder -swelling -tiredness -trouble passing urine or change in the amount of urine Side effects that usually do not require medical attention (report to your doctor or health care professional if they continue or are  bothersome): -bone pain -muscle pain This list may not describe all possible side effects. Call your doctor for medical advice about side effects. You may report side effects to FDA at 1-800-FDA-1088. Where should I keep my medicine? Keep out of the reach of children. Store pre-filled syringes in a refrigerator between 2 and 8 degrees C (36 and 46 degrees F). Do not freeze. Keep in carton to protect from light. Throw away this medicine if it is left out of the refrigerator for more than 48 hours. Throw away any unused medicine after the expiration date. NOTE: This sheet is a summary. It may not cover all possible information. If you have questions about this medicine, talk to your doctor, pharmacist, or health care provider.    2016, Elsevier/Gold Standard. (2014-02-17 14:30:14)

## 2015-10-13 ENCOUNTER — Ambulatory Visit: Payer: Medicare Other

## 2015-10-16 ENCOUNTER — Other Ambulatory Visit: Payer: Self-pay | Admitting: Oncology

## 2015-10-17 ENCOUNTER — Encounter: Payer: Self-pay | Admitting: Oncology

## 2015-10-17 ENCOUNTER — Telehealth: Payer: Self-pay | Admitting: Oncology

## 2015-10-17 ENCOUNTER — Ambulatory Visit (HOSPITAL_BASED_OUTPATIENT_CLINIC_OR_DEPARTMENT_OTHER): Payer: Medicare Other | Admitting: Oncology

## 2015-10-17 ENCOUNTER — Ambulatory Visit (HOSPITAL_BASED_OUTPATIENT_CLINIC_OR_DEPARTMENT_OTHER): Payer: Medicare Other

## 2015-10-17 ENCOUNTER — Other Ambulatory Visit (HOSPITAL_BASED_OUTPATIENT_CLINIC_OR_DEPARTMENT_OTHER): Payer: Medicare Other

## 2015-10-17 VITALS — BP 137/68 | HR 63 | Temp 98.0°F | Resp 18 | Ht 61.5 in | Wt 195.7 lb

## 2015-10-17 DIAGNOSIS — Z95828 Presence of other vascular implants and grafts: Secondary | ICD-10-CM

## 2015-10-17 DIAGNOSIS — Z5111 Encounter for antineoplastic chemotherapy: Secondary | ICD-10-CM

## 2015-10-17 DIAGNOSIS — C482 Malignant neoplasm of peritoneum, unspecified: Secondary | ICD-10-CM | POA: Diagnosis present

## 2015-10-17 DIAGNOSIS — G620 Drug-induced polyneuropathy: Secondary | ICD-10-CM

## 2015-10-17 DIAGNOSIS — R161 Splenomegaly, not elsewhere classified: Secondary | ICD-10-CM

## 2015-10-17 DIAGNOSIS — D696 Thrombocytopenia, unspecified: Secondary | ICD-10-CM | POA: Diagnosis not present

## 2015-10-17 DIAGNOSIS — T451X5A Adverse effect of antineoplastic and immunosuppressive drugs, initial encounter: Secondary | ICD-10-CM

## 2015-10-17 LAB — CBC WITH DIFFERENTIAL/PLATELET
BASO%: 0.2 % (ref 0.0–2.0)
Basophils Absolute: 0 10*3/uL (ref 0.0–0.1)
EOS%: 0.6 % (ref 0.0–7.0)
Eosinophils Absolute: 0.1 10*3/uL (ref 0.0–0.5)
HCT: 37.5 % (ref 34.8–46.6)
HGB: 12.4 g/dL (ref 11.6–15.9)
LYMPH%: 13.1 % — ABNORMAL LOW (ref 14.0–49.7)
MCH: 29.4 pg (ref 25.1–34.0)
MCHC: 33.1 g/dL (ref 31.5–36.0)
MCV: 88.9 fL (ref 79.5–101.0)
MONO#: 0.9 10*3/uL (ref 0.1–0.9)
MONO%: 6.9 % (ref 0.0–14.0)
NEUT#: 10.5 10*3/uL — ABNORMAL HIGH (ref 1.5–6.5)
NEUT%: 79.2 % — ABNORMAL HIGH (ref 38.4–76.8)
Platelets: 101 10*3/uL — ABNORMAL LOW (ref 145–400)
RBC: 4.22 10*6/uL (ref 3.70–5.45)
RDW: 19.6 % — ABNORMAL HIGH (ref 11.2–14.5)
WBC: 13.3 10*3/uL — ABNORMAL HIGH (ref 3.9–10.3)
lymph#: 1.7 10*3/uL (ref 0.9–3.3)
nRBC: 0 % (ref 0–0)

## 2015-10-17 LAB — COMPREHENSIVE METABOLIC PANEL
ALT: 117 U/L — ABNORMAL HIGH (ref 0–55)
AST: 57 U/L — ABNORMAL HIGH (ref 5–34)
Albumin: 3.9 g/dL (ref 3.5–5.0)
Alkaline Phosphatase: 190 U/L — ABNORMAL HIGH (ref 40–150)
Anion Gap: 13 mEq/L — ABNORMAL HIGH (ref 3–11)
BUN: 16.3 mg/dL (ref 7.0–26.0)
CO2: 22 mEq/L (ref 22–29)
Calcium: 8.6 mg/dL (ref 8.4–10.4)
Chloride: 108 mEq/L (ref 98–109)
Creatinine: 0.8 mg/dL (ref 0.6–1.1)
EGFR: 81 mL/min/{1.73_m2} — ABNORMAL LOW (ref >90)
Glucose: 122 mg/dl (ref 70–140)
Potassium: 4.1 mEq/L (ref 3.5–5.1)
Sodium: 143 mEq/L (ref 136–145)
Total Bilirubin: 0.47 mg/dL (ref 0.20–1.20)
Total Protein: 7.3 g/dL (ref 6.4–8.3)

## 2015-10-17 MED ORDER — HEPARIN SOD (PORK) LOCK FLUSH 100 UNIT/ML IV SOLN
500.0000 [IU] | Freq: Once | INTRAVENOUS | Status: AC | PRN
  Administered 2015-10-17: 500 [IU] via INTRAVENOUS
  Filled 2015-10-17: qty 5

## 2015-10-17 MED ORDER — SODIUM CHLORIDE 0.9 % IJ SOLN
10.0000 mL | INTRAMUSCULAR | Status: DC | PRN
  Administered 2015-10-17: 10 mL via INTRAVENOUS
  Filled 2015-10-17: qty 10

## 2015-10-17 NOTE — Patient Instructions (Signed)

## 2015-10-17 NOTE — Telephone Encounter (Signed)
appt made and avs printed °

## 2015-10-17 NOTE — Progress Notes (Signed)
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 She had first chemotherapy on 10-10-15 with single agent carboplatin due to elevated LFTs on day of treatment. She had neulasta on 10-12-15 due to cytopenias. Patient has cirrhosis secondary to NASH, with splenomegaly and probable portal hypertension.   Patient tolerated single agent carboplatin more easily than the adjuvant carbo + taxol. She had slight nausea, used zofran x 2. Constipation for first few days after chemo resolved with miralax. She has had some HA which began with chemo infusion (EMEND new with this chemo),  and increased hot flashes/ sweats. She had aches with neulasta. Restless legs overall better since IV iron. No increase in peripheral neuropathy. No new or different pain. No LE swelling. No fever or symptoms of infection. No other neurologic symptoms.  Remainder of 10 point Review of Systems negative  PAC flushed 09-28-15 Genetics testing from 02-2014 normal (OvaNext panel) CA 125 1195 at diagnosis. ER + on cytology 11-18-13 NASH cirrhosis with splenomegaly   ONCOLOGIC HISTORY Patient initially had problems in ~ July 2015 with stress urinary incontinence related to severe chronic cough. Urine culture by PCP showed no infection and she was referred to Dr Clois Comber, who adjusted medications with great improvement in the cough. Despite improved cough, she continued with some urinary incontinence. She saw Dr Ronita Hipps in 10-2013, with pelvic mass on exam and CA 125 1195 on 11-08-13. She had CT AP 11-12-13, which showed large ascites, irregular and shrunken liver, mild splenomegaly, increased  soft tissue in pelvis bilaterally and anterior mid and lower peritoneum, small left pleural effusion. She saw Dr Denman George in consultation on 11-15-13, who agreed that this was likely gyn malignancy. She had US paracentesis of 4.1 liters on 11-18-13, with cytology ([id number]) and immunohistochemical stains consistent with serous carcinoma of Mullerian tract primary, favor ovarian, ER patchy positive. She had second paracentesis for 3.6 liters on 12-03-13. Gyn oncology recommended initial treatment with chemotherapy. She had first carboplatin and taxol on 12-07-13, with carboplatin dosed at AUC=4 and taxol at 135 mg/m2 due to concern for cirrhosis. She has needed gCSF support and even so was admitted with febrile neutropenia after cycle 3. CA125 was 1938 on 12-01-13 as baseline for chemotherapy, down to 130 prior to cycle 4 and 45 after cycle 5. 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 Chemo resumed 04-19-14, regimen changed to dose dense carbo taxol with granix days 2,3,9,16. She tolerated the dose dense regimen better overall, tho cycle 3 days 8 and 15 were held with neutropenia, symptomatic anemia and fatigue. CT AP 07-04-14 had no obvious recurrent or persistent cancer. She did not go onto GOG 0225 due to [facility] research limitations when she was eligible. CA 125 was 45 in 08-2015 and 47 in 09-2015. CT AP 09-27-15 showed a new small bowel mesenteric nodule 1 cm and a left external iliac node now 1.2 cm compared with 5 mm in 03-2015. Chemo started with single agent carboplatin on 10-10-15.    Objective:  Vital signs in last 24 hours:  BP 137/68 (BP Location: Left Arm, Patient Position:  Sitting)   Pulse 63   Temp 98 F (36.7 C) (Oral)   Resp 18   Ht 5' 1.5" (1.562 m)   Wt 195 lb 11.2 oz (88.8 kg)   SpO2 99%   BMI 36.38 kg/m  Weight down 1  lb Alert, oriented and appropriate. Ambulatory without difficulty.  No alopecia  HEENT:PERRL, sclerae not icteric. Oral mucosa moist without lesions, posterior pharynx clear.  Neck supple. No JVD.  Lymphatics:no supraclavicular adenopathy Resp: clear to auscultation bilaterally and normal percussion bilaterally Cardio: regular rate and rhythm. No gallop. GI: abdomen obese, soft, nontender, not distended, no appreciable mass or organomegaly. Some bowel sounds. Surgical incision not remarkable. Musculoskeletal/ Extremities: without pitting edema, cords, tenderness Neuro: speech fluent, CN intact, no change peripheral neuropathy. Otherwise nonfocal PSYCH appropriate mood and affect Skin without rash, ecchymosis, petechiae Portacath-without erythema or tenderness  Lab Results:  Results for orders placed or performed in visit on 10/17/15  CBC with Differential  Result Value Ref Range   WBC 13.3 (H) 3.9 - 10.3 10e3/uL   NEUT# 10.5 (H) 1.5 - 6.5 10e3/uL   HGB 12.4 11.6 - 15.9 g/dL   HCT 37.5 34.8 - 46.6 %   Platelets 101 (L) 145 - 400 10e3/uL   MCV 88.9 79.5 - 101.0 fL   MCH 29.4 25.1 - 34.0 pg   MCHC 33.1 31.5 - 36.0 g/dL   RBC 4.22 3.70 - 5.45 10e6/uL   RDW 19.6 (H) 11.2 - 14.5 %   lymph# 1.7 0.9 - 3.3 10e3/uL   MONO# 0.9 0.1 - 0.9 10e3/uL   Eosinophils Absolute 0.1 0.0 - 0.5 10e3/uL   Basophils Absolute 0.0 0.0 - 0.1 10e3/uL   NEUT% 79.2 (H) 38.4 - 76.8 %   LYMPH% 13.1 (L) 14.0 - 49.7 %   MONO% 6.9 0.0 - 14.0 %   EOS% 0.6 0.0 - 7.0 %   BASO% 0.2 0.0 - 2.0 %   nRBC 0 0 - 0 %  Comprehensive metabolic panel  Result Value Ref Range   Sodium 143 136 - 145 mEq/L   Potassium 4.1 3.5 - 5.1 mEq/L   Chloride 108 98 - 109 mEq/L   CO2 22 22 - 29 mEq/L   Glucose 122 70 - 140 mg/dl   BUN 16.3 7.0 - 26.0 mg/dL   Creatinine 0.8 0.6 - 1.1 mg/dL   Total Bilirubin 0.47 0.20 - 1.20 mg/dL   Alkaline Phosphatase 190 (H) 40 - 150 U/L   AST 57 (H) 5 - 34 U/L   ALT 117 (H) 0 - 55 U/L   Total  Protein 7.3 6.4 - 8.3 g/dL   Albumin 3.9 3.5 - 5.0 g/dL   Calcium 8.6 8.4 - 10.4 mg/dL   Anion Gap 13 (H) 3 - 11 mEq/L   EGFR 81 (L) >90 ml/min/1.73 m2     Studies/Results:  No results found.  Medications: I have reviewed the patient's current medications.  Carbo skin testing with each treatment. DC EMEND from chemo premeds due to HA and not high emetogenic potential of carbo, also pepcid/ claritin as no taxol  DISCUSSION Multiple other significant comorbidities and does not tolerate chemo well, including cytopenias. Will continue single agent carbo for now, as she feels this is more tolerable.  Will need gCSF either as the neulasta or several days of granix after each treatment given history of cytopenias.  Patient was worked in to see Dr Denman George on 10-18-15 when initially found recurrence, but tells me that she is comfortable  with information and plan now, will cancel that appointment but have left Dr Rossi's previously scheduled appointment for Oct.   Assessment/Plan:  1.high grade serous primary peritoneal carcinoma: Neoadjuvant chemotherapy with carboplatin and taxol 12-07-13 thru cycle 5 on 03-08-14. Interval optimal debulking of what was still large amount of disease, with residual TNTC <3mm tumor areas. Additional chemo given x 3 cycles after debulking, thru 05-30-14, complicated by cytopenias and neuropathy. CA 125 now mid 40s with early evidence of recurrence on CT AP 09-27-15. Chemo resumed with carboplatin on 10-10-15. Will treat with cycle 2 carbo on 9-18 as long as ANC >=1.5 and plt >=100k.  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 Liver function tests stable thru previous chemo. Known to Dr Malcolm Stark. 3.PAC in, functioned without difficulty for chemo 4.diabetes on oral agent, has been following some blood sugars at home. Blood sugar >300 on day of chemo, after premed decadron for planned taxol. No oral decadron premed needed for  carboplatin. 5. 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 Tapered somewhat but intolerable hot flashes when lower than 1/2 tab daily. See above 9..narrow angle glaucoma: following with ophthalmology. Stress urinary incontinence. Chronic cough resolved with interventions by pulmonary. 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 Stable, carboplatin not as likely to exacerbate as taxol 12.chronic orthopedic problems including back. Known to Dr GIoffre. Chiropractic interventions also helping and fine to continue 15.obesity, BMI 36   All questions answered. Time spent 25 min including >50% counseling and coordination of care. Chemo orders confirmed. Route PCP, cc Dr Rossi   Lennis Livesay, MD   10/17/2015, 1:57 PM   

## 2015-10-18 ENCOUNTER — Ambulatory Visit: Payer: Medicare Other | Admitting: Gynecologic Oncology

## 2015-10-18 ENCOUNTER — Encounter: Payer: Self-pay | Admitting: Oncology

## 2015-10-18 NOTE — Progress Notes (Signed)
Per covermymeds/bcbs lorazepam was approved 10/06/15-10/05/16

## 2015-10-18 NOTE — Progress Notes (Signed)
Per [employer]bs ondansetron was approved 10/06/15-10/05/16 per covermymeds

## 2015-10-24 ENCOUNTER — Telehealth: Payer: Self-pay

## 2015-10-24 NOTE — Telephone Encounter (Signed)
Reminded Olivia Benson not to take decadron pre medication as noted below by Dr. Marko Plume.  Olivia Benson verbalized understanding.

## 2015-10-24 NOTE — Telephone Encounter (Signed)
-----   Message from Gordy Levan, MD sent at 10/18/2015  5:51 PM EDT ----- Please remind her that she does NOT have to take premed decadron since she is getting just carboplatin. Next chemo 9-18  thanks

## 2015-10-30 ENCOUNTER — Other Ambulatory Visit (HOSPITAL_BASED_OUTPATIENT_CLINIC_OR_DEPARTMENT_OTHER): Payer: Medicare Other

## 2015-10-30 ENCOUNTER — Ambulatory Visit (HOSPITAL_BASED_OUTPATIENT_CLINIC_OR_DEPARTMENT_OTHER): Payer: Medicare Other

## 2015-10-30 ENCOUNTER — Encounter: Payer: Self-pay | Admitting: *Deleted

## 2015-10-30 ENCOUNTER — Ambulatory Visit: Payer: Medicare Other

## 2015-10-30 VITALS — BP 116/64 | HR 54 | Temp 98.3°F | Resp 18

## 2015-10-30 DIAGNOSIS — C482 Malignant neoplasm of peritoneum, unspecified: Secondary | ICD-10-CM | POA: Diagnosis present

## 2015-10-30 DIAGNOSIS — Z5111 Encounter for antineoplastic chemotherapy: Secondary | ICD-10-CM | POA: Diagnosis present

## 2015-10-30 DIAGNOSIS — Z95828 Presence of other vascular implants and grafts: Secondary | ICD-10-CM

## 2015-10-30 LAB — COMPREHENSIVE METABOLIC PANEL
ALT: 90 U/L — ABNORMAL HIGH (ref 0–55)
AST: 53 U/L — ABNORMAL HIGH (ref 5–34)
Albumin: 3.6 g/dL (ref 3.5–5.0)
Alkaline Phosphatase: 136 U/L (ref 40–150)
Anion Gap: 10 mEq/L (ref 3–11)
BUN: 17.2 mg/dL (ref 7.0–26.0)
CO2: 23 mEq/L (ref 22–29)
Calcium: 8.4 mg/dL (ref 8.4–10.4)
Chloride: 110 mEq/L — ABNORMAL HIGH (ref 98–109)
Creatinine: 0.8 mg/dL (ref 0.6–1.1)
EGFR: 79 mL/min/{1.73_m2} — ABNORMAL LOW (ref >90)
Glucose: 162 mg/dl — ABNORMAL HIGH (ref 70–140)
Potassium: 4 mEq/L (ref 3.5–5.1)
Sodium: 144 mEq/L (ref 136–145)
Total Bilirubin: 0.41 mg/dL (ref 0.20–1.20)
Total Protein: 7 g/dL (ref 6.4–8.3)

## 2015-10-30 LAB — CBC WITH DIFFERENTIAL/PLATELET
BASO%: 0.4 % (ref 0.0–2.0)
Basophils Absolute: 0 10*3/uL (ref 0.0–0.1)
EOS%: 1.5 % (ref 0.0–7.0)
Eosinophils Absolute: 0.1 10*3/uL (ref 0.0–0.5)
HCT: 35.1 % (ref 34.8–46.6)
HGB: 11.7 g/dL (ref 11.6–15.9)
LYMPH%: 28.2 % (ref 14.0–49.7)
MCH: 30.1 pg (ref 25.1–34.0)
MCHC: 33.3 g/dL (ref 31.5–36.0)
MCV: 90.2 fL (ref 79.5–101.0)
MONO#: 0.2 10*3/uL (ref 0.1–0.9)
MONO%: 4.4 % (ref 0.0–14.0)
NEUT#: 3.1 10*3/uL (ref 1.5–6.5)
NEUT%: 65.5 % (ref 38.4–76.8)
Platelets: 130 10*3/uL — ABNORMAL LOW (ref 145–400)
RBC: 3.89 10*6/uL (ref 3.70–5.45)
RDW: 18.5 % — ABNORMAL HIGH (ref 11.2–14.5)
WBC: 4.8 10*3/uL (ref 3.9–10.3)
lymph#: 1.3 10*3/uL (ref 0.9–3.3)

## 2015-10-30 MED ORDER — HEPARIN SOD (PORK) LOCK FLUSH 100 UNIT/ML IV SOLN
500.0000 [IU] | Freq: Once | INTRAVENOUS | Status: AC | PRN
  Administered 2015-10-30: 500 [IU]
  Filled 2015-10-30: qty 5

## 2015-10-30 MED ORDER — CARBOPLATIN CHEMO INTRADERMAL TEST DOSE 100MCG/0.02ML
100.0000 ug | Freq: Once | INTRADERMAL | Status: AC
  Administered 2015-10-30: 100 ug via INTRADERMAL
  Filled 2015-10-30: qty 0.01

## 2015-10-30 MED ORDER — SODIUM CHLORIDE 0.9 % IV SOLN
302.7000 mg | Freq: Once | INTRAVENOUS | Status: AC
  Administered 2015-10-30: 300 mg via INTRAVENOUS
  Filled 2015-10-30: qty 30

## 2015-10-30 MED ORDER — SODIUM CHLORIDE 0.9% FLUSH
10.0000 mL | INTRAVENOUS | Status: DC | PRN
  Administered 2015-10-30: 10 mL
  Filled 2015-10-30: qty 10

## 2015-10-30 MED ORDER — LORAZEPAM 2 MG/ML IJ SOLN: 0.5000 mg | Freq: Once | INTRAMUSCULAR | Status: DC | PRN

## 2015-10-30 MED ORDER — SODIUM CHLORIDE 0.9 % IV SOLN
Freq: Once | INTRAVENOUS | Status: AC
  Administered 2015-10-30: 14:00:00 via INTRAVENOUS
  Filled 2015-10-30: qty 4

## 2015-10-30 MED ORDER — SODIUM CHLORIDE 0.9 % IV SOLN
Freq: Once | INTRAVENOUS | Status: AC
  Administered 2015-10-30: 13:00:00 via INTRAVENOUS

## 2015-10-30 MED ORDER — SODIUM CHLORIDE 0.9 % IJ SOLN
10.0000 mL | INTRAMUSCULAR | Status: DC | PRN
  Administered 2015-10-30: 10 mL via INTRAVENOUS
  Filled 2015-10-30: qty 10

## 2015-10-30 NOTE — Patient Instructions (Signed)
[facility] Discharge Instructions for Patients Receiving Chemotherapy  Today you received the following chemotherapy agents Carboplatin  To help prevent nausea and vomiting after your treatment, we encourage you to take your nausea medication   If you develop nausea and vomiting that is not controlled by your nausea medication, call the clinic.   BELOW ARE SYMPTOMS THAT SHOULD BE REPORTED IMMEDIATELY:  *FEVER GREATER THAN 100.5 F  *CHILLS WITH OR WITHOUT FEVER  NAUSEA AND VOMITING THAT IS NOT CONTROLLED WITH YOUR NAUSEA MEDICATION  *UNUSUAL SHORTNESS OF BREATH  *UNUSUAL BRUISING OR BLEEDING  TENDERNESS IN MOUTH AND THROAT WITH OR WITHOUT PRESENCE OF ULCERS  *URINARY PROBLEMS  *BOWEL PROBLEMS  UNUSUAL RASH Items with * indicate a potential emergency and should be followed up as soon as possible.  Feel free to call the clinic you have any questions or concerns. The clinic phone number is [phone].  Please show the [place] at check-in to the Emergency Department and triage nurse.

## 2015-10-30 NOTE — Patient Instructions (Signed)

## 2015-10-30 NOTE — Progress Notes (Signed)
Ok to treat with CMET per MD Marko Plume

## 2015-11-01 ENCOUNTER — Ambulatory Visit (HOSPITAL_BASED_OUTPATIENT_CLINIC_OR_DEPARTMENT_OTHER): Payer: Medicare Other

## 2015-11-01 VITALS — BP 155/67 | HR 54 | Temp 98.3°F | Resp 18

## 2015-11-01 DIAGNOSIS — Z5189 Encounter for other specified aftercare: Secondary | ICD-10-CM | POA: Diagnosis not present

## 2015-11-01 DIAGNOSIS — C482 Malignant neoplasm of peritoneum, unspecified: Secondary | ICD-10-CM

## 2015-11-01 MED ORDER — PEGFILGRASTIM INJECTION 6 MG/0.6ML ~~LOC~~
6.0000 mg | PREFILLED_SYRINGE | Freq: Once | SUBCUTANEOUS | Status: AC
  Administered 2015-11-01: 6 mg via SUBCUTANEOUS
  Filled 2015-11-01: qty 0.6

## 2015-11-01 NOTE — Patient Instructions (Signed)
Pegfilgrastim injection What is this medicine? PEGFILGRASTIM (PEG fil gra stim) is a long-acting granulocyte colony-stimulating factor that stimulates the growth of neutrophils, a type of white blood cell important in the body's fight against infection. It is used to reduce the incidence of fever and infection in patients with certain types of cancer who are receiving chemotherapy that affects the bone marrow, and to increase survival after being exposed to high doses of radiation. This medicine may be used for other purposes; ask your health care provider or pharmacist if you have questions. What should I tell my health care provider before I take this medicine? They need to know if you have any of these conditions: -kidney disease -latex allergy -ongoing radiation therapy -sickle cell disease -skin reactions to acrylic adhesives (On-Body Injector only) -an unusual or allergic reaction to pegfilgrastim, filgrastim, other medicines, foods, dyes, or preservatives -pregnant or trying to get pregnant -breast-feeding How should I use this medicine? This medicine is for injection under the skin. If you get this medicine at home, you will be taught how to prepare and give the pre-filled syringe or how to use the On-body Injector. Refer to the patient Instructions for Use for detailed instructions. Use exactly as directed. Take your medicine at regular intervals. Do not take your medicine more often than directed. It is important that you put your used needles and syringes in a special sharps container. Do not put them in a trash can. If you do not have a sharps container, call your pharmacist or healthcare provider to get one. Talk to your pediatrician regarding the use of this medicine in children. While this drug may be prescribed for selected conditions, precautions do apply. Overdosage: If you think you have taken too much of this medicine contact a poison control center or emergency room at  once. NOTE: This medicine is only for you. Do not share this medicine with others. What if I miss a dose? It is important not to miss your dose. Call your doctor or health care professional if you miss your dose. If you miss a dose due to an On-body Injector failure or leakage, a new dose should be administered as soon as possible using a single prefilled syringe for manual use. What may interact with this medicine? Interactions have not been studied. Give your health care provider a list of all the medicines, herbs, non-prescription drugs, or dietary supplements you use. Also tell them if you smoke, drink alcohol, or use illegal drugs. Some items may interact with your medicine. This list may not describe all possible interactions. Give your health care provider a list of all the medicines, herbs, non-prescription drugs, or dietary supplements you use. Also tell them if you smoke, drink alcohol, or use illegal drugs. Some items may interact with your medicine. What should I watch for while using this medicine? You may need blood work done while you are taking this medicine. If you are going to need a MRI, CT scan, or other procedure, tell your doctor that you are using this medicine (On-Body Injector only). What side effects may I notice from receiving this medicine? Side effects that you should report to your doctor or health care professional as soon as possible: -allergic reactions like skin rash, itching or hives, swelling of the face, lips, or tongue -dizziness -fever -pain, redness, or irritation at site where injected -pinpoint red spots on the skin -red or dark-brown urine -shortness of breath or breathing problems -stomach or side pain, or pain  at the shoulder -swelling -tiredness -trouble passing urine or change in the amount of urine Side effects that usually do not require medical attention (report to your doctor or health care professional if they continue or are  bothersome): -bone pain -muscle pain This list may not describe all possible side effects. Call your doctor for medical advice about side effects. You may report side effects to FDA at 1-800-FDA-1088. Where should I keep my medicine? Keep out of the reach of children. Store pre-filled syringes in a refrigerator between 2 and 8 degrees C (36 and 46 degrees F). Do not freeze. Keep in carton to protect from light. Throw away this medicine if it is left out of the refrigerator for more than 48 hours. Throw away any unused medicine after the expiration date. NOTE: This sheet is a summary. It may not cover all possible information. If you have questions about this medicine, talk to your doctor, pharmacist, or health care provider.    2016, Elsevier/Gold Standard. (2014-02-17 14:30:14)

## 2015-11-12 ENCOUNTER — Other Ambulatory Visit: Payer: Self-pay | Admitting: Oncology

## 2015-11-13 ENCOUNTER — Other Ambulatory Visit: Payer: Self-pay | Admitting: Oncology

## 2015-11-13 ENCOUNTER — Ambulatory Visit: Payer: Medicare Other

## 2015-11-13 ENCOUNTER — Other Ambulatory Visit (HOSPITAL_BASED_OUTPATIENT_CLINIC_OR_DEPARTMENT_OTHER): Payer: Medicare Other

## 2015-11-13 ENCOUNTER — Other Ambulatory Visit: Payer: Self-pay

## 2015-11-13 ENCOUNTER — Ambulatory Visit (HOSPITAL_BASED_OUTPATIENT_CLINIC_OR_DEPARTMENT_OTHER): Payer: Medicare Other | Admitting: Oncology

## 2015-11-13 VITALS — BP 149/59 | HR 70 | Temp 97.7°F | Resp 18 | Ht 61.5 in | Wt 195.2 lb

## 2015-11-13 DIAGNOSIS — Z6836 Body mass index (BMI) 36.0-36.9, adult: Secondary | ICD-10-CM | POA: Diagnosis not present

## 2015-11-13 DIAGNOSIS — G620 Drug-induced polyneuropathy: Secondary | ICD-10-CM | POA: Diagnosis not present

## 2015-11-13 DIAGNOSIS — Z23 Encounter for immunization: Secondary | ICD-10-CM

## 2015-11-13 DIAGNOSIS — E669 Obesity, unspecified: Secondary | ICD-10-CM

## 2015-11-13 DIAGNOSIS — G2581 Restless legs syndrome: Secondary | ICD-10-CM

## 2015-11-13 DIAGNOSIS — C482 Malignant neoplasm of peritoneum, unspecified: Secondary | ICD-10-CM

## 2015-11-13 DIAGNOSIS — D6959 Other secondary thrombocytopenia: Secondary | ICD-10-CM | POA: Diagnosis not present

## 2015-11-13 DIAGNOSIS — Z95828 Presence of other vascular implants and grafts: Secondary | ICD-10-CM

## 2015-11-13 DIAGNOSIS — R161 Splenomegaly, not elsewhere classified: Secondary | ICD-10-CM

## 2015-11-13 DIAGNOSIS — E119 Type 2 diabetes mellitus without complications: Secondary | ICD-10-CM

## 2015-11-13 DIAGNOSIS — K7460 Unspecified cirrhosis of liver: Secondary | ICD-10-CM

## 2015-11-13 DIAGNOSIS — Z5111 Encounter for antineoplastic chemotherapy: Secondary | ICD-10-CM

## 2015-11-13 LAB — COMPREHENSIVE METABOLIC PANEL
ALT: 103 U/L — ABNORMAL HIGH (ref 0–55)
AST: 69 U/L — ABNORMAL HIGH (ref 5–34)
Albumin: 3.8 g/dL (ref 3.5–5.0)
Alkaline Phosphatase: 140 U/L (ref 40–150)
Anion Gap: 13 mEq/L — ABNORMAL HIGH (ref 3–11)
BUN: 14.9 mg/dL (ref 7.0–26.0)
CO2: 22 mEq/L (ref 22–29)
Calcium: 8.2 mg/dL — ABNORMAL LOW (ref 8.4–10.4)
Chloride: 109 mEq/L (ref 98–109)
Creatinine: 0.8 mg/dL (ref 0.6–1.1)
EGFR: 82 mL/min/{1.73_m2} — ABNORMAL LOW (ref >90)
Glucose: 149 mg/dl — ABNORMAL HIGH (ref 70–140)
Potassium: 3.9 mEq/L (ref 3.5–5.1)
Sodium: 144 mEq/L (ref 136–145)
Total Bilirubin: 0.67 mg/dL (ref 0.20–1.20)
Total Protein: 7.2 g/dL (ref 6.4–8.3)

## 2015-11-13 LAB — CBC WITH DIFFERENTIAL/PLATELET
BASO%: 0.2 % (ref 0.0–2.0)
Basophils Absolute: 0 10*3/uL (ref 0.0–0.1)
EOS%: 1 % (ref 0.0–7.0)
Eosinophils Absolute: 0.1 10*3/uL (ref 0.0–0.5)
HCT: 35.2 % (ref 34.8–46.6)
HGB: 11.9 g/dL (ref 11.6–15.9)
LYMPH%: 19 % (ref 14.0–49.7)
MCH: 30.8 pg (ref 25.1–34.0)
MCHC: 33.8 g/dL (ref 31.5–36.0)
MCV: 91.2 fL (ref 79.5–101.0)
MONO#: 0.2 10*3/uL (ref 0.1–0.9)
MONO%: 4.5 % (ref 0.0–14.0)
NEUT#: 3.9 10*3/uL (ref 1.5–6.5)
NEUT%: 75.3 % (ref 38.4–76.8)
Platelets: 84 10*3/uL — ABNORMAL LOW (ref 145–400)
RBC: 3.86 10*6/uL (ref 3.70–5.45)
RDW: 17.7 % — ABNORMAL HIGH (ref 11.2–14.5)
WBC: 5.2 10*3/uL (ref 3.9–10.3)
lymph#: 1 10*3/uL (ref 0.9–3.3)

## 2015-11-13 MED ORDER — HYDROCODONE-ACETAMINOPHEN 5-325 MG PO TABS: 1.0000 | ORAL_TABLET | Freq: Four times a day (QID) | ORAL | 0 refills | Status: DC | PRN

## 2015-11-13 MED ORDER — SODIUM CHLORIDE 0.9 % IJ SOLN
10.0000 mL | INTRAMUSCULAR | Status: DC | PRN
Start: 2015-11-13 — End: 2015-11-13
  Administered 2015-11-13: 10 mL via INTRAVENOUS
  Filled 2015-11-13: qty 10

## 2015-11-13 MED ORDER — INFLUENZA VAC SPLIT QUAD 0.5 ML IM SUSY
0.5000 mL | PREFILLED_SYRINGE | Freq: Once | INTRAMUSCULAR | Status: AC
  Administered 2015-11-13: 0.5 mL via INTRAMUSCULAR
  Filled 2015-11-13: qty 0.5

## 2015-11-13 MED ORDER — HEPARIN SOD (PORK) LOCK FLUSH 100 UNIT/ML IV SOLN
500.0000 [IU] | Freq: Once | INTRAVENOUS | Status: AC | PRN
  Administered 2015-11-13: 500 [IU] via INTRAVENOUS
  Filled 2015-11-13: qty 5

## 2015-11-13 NOTE — Progress Notes (Signed)
OFFICE PROGRESS NOTE   November 13, 2015   Physicians: Zack Seal, Bryn Gulling, MD (PCP), Luvenia Starch.Wert, P.Nahser, M.Stark/ J.Mann, R.Tat, R.Gioffre  INTERVAL HISTORY:  Patient is seen, alone for visit, in continuing attention to recurrent primary peritoneal carcinoma, for which she had second carboplatin on 10-30-15, with neulasta on 11-01-15. Planned taxol was held cycle 1 due to more elevated liver chemistries (known cirrhosis due to NASH, with splenomegaly), which she tolerated so much better than adjuvant carbo + taxol that cycle 2 was also single agent carboplatin. Carbo dose is AUC = 3, which was the dose that she tolerated with gCSF during adjuvant therapy.  Skin tests used prior to each carboplatin. She is to see Dr Denman George on 11-20-15.  Due to HA cycle 1, EMEND was not used cycle 2; she had no HA but did have more nausea cycle 2 without the EMEND. She has not vomited. She notices more food intolerances. Bowels are moving more easily since on metformin, no bleeding, no fever, no abdominal pain. She had more severe aches in legs after neulasta, nearly resolved now but initially nearly intolerable. No increased SOB and no cough. No problems with PAC. Some hot flashes since she has stopped estradiol.  She had URI symptoms 2 weeks ago when husband also sick, now continues slight clear rhinorrhea which seems to be environmental allergies. Co-pay for several meds including rotigotine patch so high that she did not fill prescription, tho this was clearly helpful for restless legs. No increased peripheral neuropathy with just [place].  Remainder of 10 point Review of Systems negative  PAC  Genetics testing from 02-2014 normal (OvaNext panel) CA 125 1195 at diagnosis. ER + on cytology 11-18-13 NASH cirrhosis with splenomegaly Flu vaccine 11-13-15   ONCOLOGIC HISTORY Patient initially had problems in ~ July 2015 with stress urinary incontinence related to severe chronic cough. Urine culture by PCP  showed no infection and she was referred to Dr Clois Comber, who adjusted medications with great improvement in the cough. Despite improved cough, she continued with some urinary incontinence. She saw Dr Ronita Hipps in 10-2013, with pelvic mass on exam and CA 125 1195 on 11-08-13. She had CT AP 11-12-13, which showed large ascites, irregular and shrunken liver, mild splenomegaly, increased soft tissue in pelvis bilaterally and anterior mid and lower peritoneum, small left pleural effusion. She saw Dr Denman George in consultation on 11-15-13, who agreed that this was likely gyn malignancy. 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 She had second paracentesis for 3.6 liters on 12-03-13. Gyn oncology recommended initial treatment with chemotherapy. She had first carboplatin and taxol on 12-07-13, with carboplatin dosed at AUC=4 and taxol at 135 mg/m2 due to concern for cirrhosis. She has needed gCSF support and even so was admitted with febrile neutropenia after cycle 3. CA125 was 1938 on 12-01-13 as baseline for chemotherapy, down to 130 prior to cycle 4 and 45 after cycle 5. 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 Chemo resumed 04-19-14, regimen changed to dose dense carbo taxol with granix days 2,3,9,16. She tolerated the dose dense regimen better overall, tho cycle 3 days 8 and 15 were held with neutropenia, symptomatic anemia and fatigue. CT AP 07-04-14 had no obvious recurrent or persistent cancer. She did  not go onto GOG 0225 due to [facility] research limitations when she was eligible. CA 125 was 45 in 08-2015 and 47 in 09-2015. CT AP 09-27-15 showed a new small bowel mesenteric nodule 1 cm and a left external  iliac node now 1.2 cm compared with 5 mm in 03-2015. Chemo resumed with single agent carboplatin on 10-10-15, dosed at AUC =3 due to prior problems with cytopenias, with neulasta.   Objective:  Vital signs in last 24 hours:  BP (!) 149/59 (BP Location: Left Arm, Patient Position: Sitting) Comment: informed nurse  Pulse 70   Temp 97.7 F (36.5 C) (Oral)   Resp 18   Ht 5' 1.5" (1.562 m)   Wt 195 lb 3.2 oz (88.5 kg)   SpO2 99%   BMI 36.29 kg/m  Weight stable Alert, oriented and appropriate. Looks comfortable, very pleasant as always. Ambulatory without assistance.  No alopecia  HEENT:PERRL, sclerae not icteric. Oral mucosa moist without lesions, posterior pharynx clear. Nasal turbinates not boggy. TMs clear bilaterally Neck supple. No JVD.  Lymphatics:no cervical,supraclavicular adenopathy Resp: clear to auscultation bilaterally and normal percussion bilaterally Cardio: regular rate and rhythm. No gallop. GI: abdomen obese, soft, nontender, not distended, no mass or palpable HSM. Normally active bowel sounds. Surgical incision not remarkable. Musculoskeletal/ Extremities: without pitting edema, cords, tenderness Neuro: no change peripheral neuropathy. Otherwise nonfocal. PSYCH appropriate mood and affect Skin without rash, ecchymosis, petechiae Portacath-without erythema or tenderness  Lab Results:  Results for orders placed or performed in visit on 11/13/15  CBC with Differential  Result Value Ref Range   WBC 5.2 3.9 - 10.3 10e3/uL   NEUT# 3.9 1.5 - 6.5 10e3/uL   HGB 11.9 11.6 - 15.9 g/dL   HCT 35.2 34.8 - 46.6 %   Platelets 84 (L) 145 - 400 10e3/uL   MCV 91.2 79.5 - 101.0 fL   MCH 30.8 25.1 - 34.0 pg   MCHC 33.8 31.5 - 36.0 g/dL   RBC 3.86 3.70 - 5.45 10e6/uL   RDW 17.7 (H) 11.2 - 14.5 %   lymph# 1.0 0.9 - 3.3 10e3/uL   MONO# 0.2 0.1 - 0.9 10e3/uL   Eosinophils Absolute 0.1 0.0 - 0.5 10e3/uL   Basophils Absolute 0.0 0.0 - 0.1 10e3/uL   NEUT% 75.3 38.4 - 76.8 %    LYMPH% 19.0 14.0 - 49.7 %   MONO% 4.5 0.0 - 14.0 %   EOS% 1.0 0.0 - 7.0 %   BASO% 0.2 0.0 - 2.0 %  Comprehensive metabolic panel  Result Value Ref Range   Sodium 144 136 - 145 mEq/L   Potassium 3.9 3.5 - 5.1 mEq/L   Chloride 109 98 - 109 mEq/L   CO2 22 22 - 29 mEq/L   Glucose 149 (H) 70 - 140 mg/dl   BUN 14.9 7.0 - 26.0 mg/dL   Creatinine 0.8 0.6 - 1.1 mg/dL   Total Bilirubin 0.67 0.20 - 1.20 mg/dL   Alkaline Phosphatase 140 40 - 150 U/L   AST 69 (H) 5 - 34 U/L   ALT 103 (H) 0 - 55 U/L   Total Protein 7.2 6.4 - 8.3 g/dL   Albumin 3.8 3.5 - 5.0 g/dL   Calcium 8.2 (L) 8.4 - 10.4 mg/dL   Anion Gap 13 (H) 3 - 11 mEq/L   EGFR 82 (L) >90 ml/min/1.73 m2   Note platelets 84k today, day 15 cycle 2 carbo   CA 125 on 10-10-15   53, will repeat with next labs 11-20-15  Studies/Results:  No results found.  Medications: I have reviewed the patient's current medications. Flu vaccine given today Hydrocodone 5-325 #10 written to try for neulasta aches. No concerns on NCCSRS  DISCUSSION  Taxol with adjuvant therapy very difficult to tolerate with aches, and marked cytopenias with higher doses of carboplatin tried then.  As she has small volume recurent disease by CT 09-2015, may be able to go to olaparib or niraparib if response to single agent carboplatin (also note ER +).  I have told her in general about PARP inhibitors, including recent approval in BRCA negative patients. She understands that Dr Denman George may have recommendations also in this regard.  She does not appear to have viral URI now. Flu vaccine given after visit today.  Thrombocytopenia with chemo and splenomegaly related to cirrhosis. She knows to call if bleeding.   Assessment/Plan:  1.high grade serous primary peritoneal carcinoma: Neoadjuvant chemotherapy with carboplatin and taxol 12-07-13 thru cycle 5 on 03-08-14. Interval optimal debulking of what was still large amount of disease, with residual TNTC <59m tumor areas.  Additional chemo given x 3 cycles after debulking, thru 40-69-86 complicated by cytopenias and neuropathy. Rising CA 125 and small volume recurrent disease documented 09-2015. She has had 2 cycles of carboplatin, due cycle 3 on 11-21-15 if AGolden>=1.5 and plt >=100k by labs planned 11-20-15.  PARP inhibitor may be option if responds to this platinum treatment. Note also ER +. 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 Known to Dr MLucio Edward 3.PAC in  4.diabetes on oral agent, has been following blood sugars at home  5. Thrombocytopenia related to splenomegaly and chemo, despite carbo dose reduced as above 6,post TAH 1988 for benign indications 7.nonSTEMI 2012 unremarkable cath then, HTN, known to Dr NAcie Fredrickson8.on estrogen x years, note malignancy is ER +. 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 Stress urinary incontinence. Chronic cough resolved with interventions by pulmonary. Past tobacco 10.restless legs:better with interventions by Dr Tat and since full IV iron repletion. 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 Known to Dr GGladstone Lighter Chiropractic interventions also helping and fine to continue 15.obesity, BMI 36 16.flu vaccine 11-13-15 17.neulasta aches: can try hydocodone, but cautioned her not to continue this if not helpful, aware of possible constipation.  All questions answered. Chemo and neulasta orders confirmed, labs to be done with Dr RSerita Gritvisit 10-9, parameters as above. Time spent 25 min including >50% counseling and coordination of care. Route PCP. In EMR for Drs RDenman Georgeand Tat.   LEvlyn Clines MD   11/13/2015, 11:48 AM

## 2015-11-15 ENCOUNTER — Encounter: Payer: Self-pay | Admitting: Oncology

## 2015-11-20 ENCOUNTER — Ambulatory Visit: Payer: Medicare Other | Admitting: Gynecologic Oncology

## 2015-11-20 ENCOUNTER — Other Ambulatory Visit (HOSPITAL_BASED_OUTPATIENT_CLINIC_OR_DEPARTMENT_OTHER): Payer: Medicare Other

## 2015-11-20 ENCOUNTER — Ambulatory Visit: Payer: Medicare Other

## 2015-11-20 ENCOUNTER — Ambulatory Visit (HOSPITAL_BASED_OUTPATIENT_CLINIC_OR_DEPARTMENT_OTHER): Payer: Medicare Other

## 2015-11-20 VITALS — BP 150/63 | HR 68 | Temp 97.5°F | Resp 18

## 2015-11-20 DIAGNOSIS — C482 Malignant neoplasm of peritoneum, unspecified: Secondary | ICD-10-CM

## 2015-11-20 DIAGNOSIS — Z5111 Encounter for antineoplastic chemotherapy: Secondary | ICD-10-CM

## 2015-11-20 DIAGNOSIS — Z95828 Presence of other vascular implants and grafts: Secondary | ICD-10-CM

## 2015-11-20 LAB — CBC WITH DIFFERENTIAL/PLATELET
BASO%: 0.3 % (ref 0.0–2.0)
Basophils Absolute: 0 10*3/uL (ref 0.0–0.1)
EOS%: 0.8 % (ref 0.0–7.0)
Eosinophils Absolute: 0 10*3/uL (ref 0.0–0.5)
HCT: 36.7 % (ref 34.8–46.6)
HGB: 12 g/dL (ref 11.6–15.9)
LYMPH%: 15.1 % (ref 14.0–49.7)
MCH: 30.7 pg (ref 25.1–34.0)
MCHC: 32.6 g/dL (ref 31.5–36.0)
MCV: 94 fL (ref 79.5–101.0)
MONO#: 0.3 10*3/uL (ref 0.1–0.9)
MONO%: 5.5 % (ref 0.0–14.0)
NEUT#: 4.6 10*3/uL (ref 1.5–6.5)
NEUT%: 78.3 % — ABNORMAL HIGH (ref 38.4–76.8)
Platelets: 104 10*3/uL — ABNORMAL LOW (ref 145–400)
RBC: 3.9 10*6/uL (ref 3.70–5.45)
RDW: 18.3 % — ABNORMAL HIGH (ref 11.2–14.5)
WBC: 5.8 10*3/uL (ref 3.9–10.3)
lymph#: 0.9 10*3/uL (ref 0.9–3.3)

## 2015-11-20 LAB — COMPREHENSIVE METABOLIC PANEL
ALT: 100 U/L — ABNORMAL HIGH (ref 0–55)
AST: 70 U/L — ABNORMAL HIGH (ref 5–34)
Albumin: 3.8 g/dL (ref 3.5–5.0)
Alkaline Phosphatase: 109 U/L (ref 40–150)
Anion Gap: 11 mEq/L (ref 3–11)
BUN: 15.7 mg/dL (ref 7.0–26.0)
CO2: 23 mEq/L (ref 22–29)
Calcium: 8.3 mg/dL — ABNORMAL LOW (ref 8.4–10.4)
Chloride: 109 mEq/L (ref 98–109)
Creatinine: 0.8 mg/dL (ref 0.6–1.1)
EGFR: 78 mL/min/{1.73_m2} — ABNORMAL LOW (ref >90)
Glucose: 143 mg/dl — ABNORMAL HIGH (ref 70–140)
Potassium: 4.3 mEq/L (ref 3.5–5.1)
Sodium: 143 mEq/L (ref 136–145)
Total Bilirubin: 0.62 mg/dL (ref 0.20–1.20)
Total Protein: 7.1 g/dL (ref 6.4–8.3)

## 2015-11-20 MED ORDER — SODIUM CHLORIDE 0.9 % IJ SOLN
10.0000 mL | INTRAMUSCULAR | Status: DC | PRN
  Administered 2015-11-20: 10 mL via INTRAVENOUS
  Filled 2015-11-20: qty 10

## 2015-11-20 MED ORDER — SODIUM CHLORIDE 0.9 % IV SOLN
Freq: Once | INTRAVENOUS | Status: AC
  Administered 2015-11-20: 15:00:00 via INTRAVENOUS

## 2015-11-20 MED ORDER — LORAZEPAM 2 MG/ML IJ SOLN
0.5000 mg | Freq: Once | INTRAMUSCULAR | Status: AC | PRN
  Administered 2015-11-20: 0.5 mg via INTRAVENOUS

## 2015-11-20 MED ORDER — SODIUM CHLORIDE 0.9 % IV SOLN
302.7000 mg | Freq: Once | INTRAVENOUS | Status: AC
  Administered 2015-11-20: 300 mg via INTRAVENOUS
  Filled 2015-11-20: qty 30

## 2015-11-20 MED ORDER — SODIUM CHLORIDE 0.9% FLUSH
10.0000 mL | INTRAVENOUS | Status: DC | PRN
  Administered 2015-11-20: 10 mL
  Filled 2015-11-20: qty 10

## 2015-11-20 MED ORDER — CARBOPLATIN CHEMO INTRADERMAL TEST DOSE 100MCG/0.02ML
100.0000 ug | Freq: Once | INTRADERMAL | Status: AC
  Administered 2015-11-20: 100 ug via INTRADERMAL
  Filled 2015-11-20: qty 0.01

## 2015-11-20 MED ORDER — SODIUM CHLORIDE 0.9 % IV SOLN
Freq: Once | INTRAVENOUS | Status: AC
  Administered 2015-11-20: 16:00:00 via INTRAVENOUS
  Filled 2015-11-20: qty 4

## 2015-11-20 MED ORDER — HEPARIN SOD (PORK) LOCK FLUSH 100 UNIT/ML IV SOLN
500.0000 [IU] | Freq: Once | INTRAVENOUS | Status: AC | PRN
  Administered 2015-11-20: 500 [IU]
  Filled 2015-11-20: qty 5

## 2015-11-20 MED ORDER — LORAZEPAM 2 MG/ML IJ SOLN
INTRAMUSCULAR | Status: AC
  Filled 2015-11-20: qty 1

## 2015-11-20 NOTE — Patient Instructions (Signed)
[facility] Discharge Instructions for Patients Receiving Chemotherapy  Today you received the following chemotherapy agents: Carboplatin  To help prevent nausea and vomiting after your treatment, we encourage you to take your nausea medication as directed.    If you develop nausea and vomiting that is not controlled by your nausea medication, call the clinic.   BELOW ARE SYMPTOMS THAT SHOULD BE REPORTED IMMEDIATELY:  *FEVER GREATER THAN 100.5 F  *CHILLS WITH OR WITHOUT FEVER  NAUSEA AND VOMITING THAT IS NOT CONTROLLED WITH YOUR NAUSEA MEDICATION  *UNUSUAL SHORTNESS OF BREATH  *UNUSUAL BRUISING OR BLEEDING  TENDERNESS IN MOUTH AND THROAT WITH OR WITHOUT PRESENCE OF ULCERS  *URINARY PROBLEMS  *BOWEL PROBLEMS  UNUSUAL RASH Items with * indicate a potential emergency and should be followed up as soon as possible.  Feel free to call the clinic you have any questions or concerns. The clinic phone number is [phone].  Please show the [place] at check-in to the Emergency Department and triage nurse.

## 2015-11-20 NOTE — Progress Notes (Signed)
Per Dr. Marko Plume okay to treat with ALT of 100.

## 2015-11-21 LAB — CA 125: Cancer Antigen (CA) 125: 41.3 U/mL — ABNORMAL HIGH (ref 0.0–38.1)

## 2015-11-22 ENCOUNTER — Telehealth: Payer: Self-pay

## 2015-11-22 ENCOUNTER — Ambulatory Visit (HOSPITAL_BASED_OUTPATIENT_CLINIC_OR_DEPARTMENT_OTHER): Payer: Medicare Other

## 2015-11-22 VITALS — BP 147/56 | HR 67 | Temp 98.7°F | Resp 20

## 2015-11-22 DIAGNOSIS — Z5189 Encounter for other specified aftercare: Secondary | ICD-10-CM | POA: Diagnosis not present

## 2015-11-22 DIAGNOSIS — C482 Malignant neoplasm of peritoneum, unspecified: Secondary | ICD-10-CM

## 2015-11-22 MED ORDER — PEGFILGRASTIM INJECTION 6 MG/0.6ML ~~LOC~~
6.0000 mg | PREFILLED_SYRINGE | Freq: Once | SUBCUTANEOUS | Status: AC
  Administered 2015-11-22: 6 mg via SUBCUTANEOUS
  Filled 2015-11-22: qty 0.6

## 2015-11-22 NOTE — Patient Instructions (Signed)
Pegfilgrastim injection What is this medicine? PEGFILGRASTIM (PEG fil gra stim) is a long-acting granulocyte colony-stimulating factor that stimulates the growth of neutrophils, a type of white blood cell important in the body's fight against infection. It is used to reduce the incidence of fever and infection in patients with certain types of cancer who are receiving chemotherapy that affects the bone marrow, and to increase survival after being exposed to high doses of radiation. This medicine may be used for other purposes; ask your health care provider or pharmacist if you have questions. What should I tell my health care provider before I take this medicine? They need to know if you have any of these conditions: -kidney disease -latex allergy -ongoing radiation therapy -sickle cell disease -skin reactions to acrylic adhesives (On-Body Injector only) -an unusual or allergic reaction to pegfilgrastim, filgrastim, other medicines, foods, dyes, or preservatives -pregnant or trying to get pregnant -breast-feeding How should I use this medicine? This medicine is for injection under the skin. If you get this medicine at home, you will be taught how to prepare and give the pre-filled syringe or how to use the On-body Injector. Refer to the patient Instructions for Use for detailed instructions. Use exactly as directed. Take your medicine at regular intervals. Do not take your medicine more often than directed. It is important that you put your used needles and syringes in a special sharps container. Do not put them in a trash can. If you do not have a sharps container, call your pharmacist or healthcare provider to get one. Talk to your pediatrician regarding the use of this medicine in children. While this drug may be prescribed for selected conditions, precautions do apply. Overdosage: If you think you have taken too much of this medicine contact a poison control center or emergency room at  once. NOTE: This medicine is only for you. Do not share this medicine with others. What if I miss a dose? It is important not to miss your dose. Call your doctor or health care professional if you miss your dose. If you miss a dose due to an On-body Injector failure or leakage, a new dose should be administered as soon as possible using a single prefilled syringe for manual use. What may interact with this medicine? Interactions have not been studied. Give your health care provider a list of all the medicines, herbs, non-prescription drugs, or dietary supplements you use. Also tell them if you smoke, drink alcohol, or use illegal drugs. Some items may interact with your medicine. This list may not describe all possible interactions. Give your health care provider a list of all the medicines, herbs, non-prescription drugs, or dietary supplements you use. Also tell them if you smoke, drink alcohol, or use illegal drugs. Some items may interact with your medicine. What should I watch for while using this medicine? You may need blood work done while you are taking this medicine. If you are going to need a MRI, CT scan, or other procedure, tell your doctor that you are using this medicine (On-Body Injector only). What side effects may I notice from receiving this medicine? Side effects that you should report to your doctor or health care professional as soon as possible: -allergic reactions like skin rash, itching or hives, swelling of the face, lips, or tongue -dizziness -fever -pain, redness, or irritation at site where injected -pinpoint red spots on the skin -red or dark-brown urine -shortness of breath or breathing problems -stomach or side pain, or pain  at the shoulder -swelling -tiredness -trouble passing urine or change in the amount of urine Side effects that usually do not require medical attention (report to your doctor or health care professional if they continue or are  bothersome): -bone pain -muscle pain This list may not describe all possible side effects. Call your doctor for medical advice about side effects. You may report side effects to FDA at 1-800-FDA-1088. Where should I keep my medicine? Keep out of the reach of children. Store pre-filled syringes in a refrigerator between 2 and 8 degrees C (36 and 46 degrees F). Do not freeze. Keep in carton to protect from light. Throw away this medicine if it is left out of the refrigerator for more than 48 hours. Throw away any unused medicine after the expiration date. NOTE: This sheet is a summary. It may not cover all possible information. If you have questions about this medicine, talk to your doctor, pharmacist, or health care provider.    2016, Elsevier/Gold Standard. (2014-02-17 14:30:14)

## 2015-11-22 NOTE — Telephone Encounter (Signed)
Told [name] the results of the CA-125 from 11-20-15 as noted below by Dr. Marko Plume.

## 2015-11-22 NOTE — Telephone Encounter (Signed)
-----   Message from Gordy Levan, MD sent at 11/22/2015  9:25 AM EDT ----- Labs seen and need follow up: please let her know marker lower on 10-9, to 41 from 36 in Aug

## 2015-11-24 NOTE — Progress Notes (Signed)
Olivia Benson was seen today in neurologic consultation at the request of Dr. Marko Plume. Her PCP is Helane Rima, MD.  The consultation is for RLS.  Prior records were reviewed.  The patient also has a history of primary peritoneal carcinomatosis, grade 3.  She has undergone previous debulking surgery and chemotherapy with carboplatin and Taxol, which was last completed on 05/30/2014.  Her chemotherapy has been complicated by neutropenia, peripheral neuropathy, symptomatically anemia and fatigue.  She presents today to discuss restless leg syndrome.  Pt states that her sx's consist of a crawling sensation on the legs; she feels that she has a spider web on the leg; "I just have to move."  It may have started pre-chemo but got much worse after chemo.  It starts mid afternoon once she sits and "gets quiet" and tries to watch TV.    She has previously tried flexeril, baclofen, ultram, restoril and oxycontin.  The only one that has been helpful is oxyIR.  She has never been on requip, mirapex, levodopa, klonopin.    She admits that she craves ice.  She has been anemic.  Her last Hgb was 10.6.    11/25/14 update:  The patient is following up today in regards to her restless leg syndrome that is likely secondary to iron deficiency anemia.  Last visit, I started her on pramipexole, 0.125 mg after dinner and 0.25 mg at night. When she wakes up at night, she has "bone pain" but no RLS sx's.  She is still on one oxycodone at night for the pain but has dropped that from 2 tablets.   I was able to get a copy of her TSH from January, 2015 and it was normal at 1.820.  Dr. Marko Plume did start her on oral iron after our last visit but unfortunately the patient did not tolerate that because of nausea and it was recommended that the patient try instead a multivitamin with iron and she couldn't tolerate that either.  Said that she actually tolerated oral iron better than MVI.  Said that her ice craving went away.  05/24/15  update:  The patient follows up today in regards to restless leg syndrome.  She is on pramipexole, 0.125 mg after dinner and 0.25 mg at night.  She has been worse but isn't sure if that is because she is no longer on her oxycontin.  Admits she isn't taking her iron supplements.   She isn't sleeping at all now.  Eating ice again.  She did get married since our last visit, but reports that she has been with the same gentleman for a long time.  States that her weight and BS have both been going up and she is frustrated with that.  She has had some arthritic pain and the indocin helps but she had some stomach pain.  07/28/15 update:  The patient is seen today in follow-up for restless leg syndrome, which has been secondary to iron deficiency.  Last visit, she was complaining about worsening symptoms and increased her pramipexole 0.5 mg, so that she was taking a half a tablet after dinner and one tablet at night.  She complains that this medication makes her cough and causes SOB so she only takes 1/2 after dinner and 1/2 at night. Last visit, I  also checked her iron, and the percent saturation was low at 7% and total iron was low at 34.  I told her that she absolutely needed to restart her iron supplements.  She  states that she is taking this and "it isn't helping and my cravings for ice is increasing."  Admits that she overall isn't feeling good.  "I have pain all over."  No taking diabetes meds as prescribed.    11/27/15 update:  Pt f/u today for RLS.  She thought that mirapex caused SOB and cough and although that would be very unusual, we d/c that last visit and changed it to neupro patch.  I thought that her iron was likely low last visit and I rechecked it, and it indeed was low.  Through Dr. Marko Plume, IV iron was arranged for the patient.  She felt that she did amazing after the 2nd infusion of iron.  She is now off of the iron.  She is also off of the patch because she is in the donut hole.  She quit the IV  iron because she started chemo.  She is starting to crave ice again but not like she was last visit and not SOB like she was before last visit. The records that were made available to me were reviewed.  Unfortunately, pt had recurrence of her primary peritoneal carcinomatosis since our last visit.  Her chemo was restarted.   PREVIOUS MEDICATIONS: n/a  ALLERGIES:   Allergies  Allergen Reactions  . Shellfish Allergy Anaphylaxis    HER THROAT SWELLS UP AND CLOSES  . Benadryl [Diphenhydramine Hcl] Other (See Comments)    Restless legs   . Penicillins Rash    WHEN SHE WAS TEENAGER  . Tape Itching    Red itch with some tapes    CURRENT MEDICATIONS:  Outpatient Encounter Prescriptions as of 11/28/2015  Medication Sig  . amLODipine (NORVASC) 2.5 MG tablet Take 2.5 mg by mouth daily.  . bisoprolol (ZEBETA) 5 MG tablet   . dexamethasone (DECADRON) 4 MG tablet Take 5 tablets = 20 mg with food 12 hrs and 6 hrs prior to Taxol  Chemotherapy.  . lidocaine-prilocaine (EMLA) cream Apply to Porta-cath  1-2 hours prior to access as directed.  . loratadine (CLARITIN) 10 MG tablet Take 10 mg by mouth daily as needed for allergies.  . metFORMIN (GLUCOPHAGE-XR) 500 MG 24 hr tablet Take 1,000 mg by mouth 2 (two) times daily.   . ondansetron (ZOFRAN) 8 MG tablet Take 1 tablet (8 mg total) by mouth every 8 (eight) hours as needed for nausea or vomiting.  . pantoprazole (PROTONIX) 40 MG tablet Take 40 mg by mouth daily.   . sitaGLIPtin (JANUVIA) 50 MG tablet Take 50 mg by mouth daily.  . [DISCONTINUED] estradiol (ESTRACE) 1 MG tablet Take 1 mg by mouth as needed.   . [DISCONTINUED] HYDROcodone-acetaminophen (NORCO/VICODIN) 5-325 MG tablet Take 1 tablet by mouth every 6 (six) hours as needed for moderate pain.  . [DISCONTINUED] indomethacin (INDOCIN) 25 MG capsule take 2 capsules by mouth every 12 hours  . [DISCONTINUED] LORazepam (ATIVAN) 0.5 MG tablet Place 1 tablet under the tongue or swallow every 6 hrs as  needed for nausea.  . [DISCONTINUED] Rotigotine (NEUPRO) 1 MG/24HR PT24 Place 1 patch (1 mg total) onto the skin daily. (Patient not taking: Reported on 11/13/2015)   No facility-administered encounter medications on file as of 11/28/2015.     PAST MEDICAL HISTORY:   Past Medical History:  Diagnosis Date  . Asthma   . Cirrhosis ([place])   . Complication of anesthesia    slow to wake up / n & v  . Diabetes mellitus   . Elevated LFTs   .  Family history of adverse reaction to anesthesia    multiple family members have had problems such as slow to wake / "flat lined" /  ventilator    . Frequency of urination   . Glaucoma, narrow-angle   . History of skin cancer   . Hypertension   . Non-ST elevated myocardial infarction (non-STEMI) [facility]) Sept 2012   Mild elevation in troponin and CK MB but no evidence of coronary disease at time of cath  . Obesity   . Ovarian cancer ([place]) 11/22/2013   Disseminated ovarian cancer  . Overactive bladder   . PONV (postoperative nausea and vomiting)   . Portal hypertension ([place])   . Restless leg syndrome   . Tingling    hands and feet    PAST SURGICAL HISTORY:   Past Surgical History:  Procedure Laterality Date  . ABDOMINAL ULTRASOUND  Sept 2012   No gallbladder disease, + fatty liver  . CARDIAC CATHETERIZATION  10/25/2010   EF 60%; Normal coronaries  . COLONOSCOPY  10/18/2005  . COSMETIC SURGERY  2003  . DENTAL SURGERY    . EYE SURGERY Bilateral 09/19/14 R eye, 08/2014 L eye   open angles in both eyes d/t narrow angle glaucoma  . LAPAROTOMY N/A 03/29/2014   Procedure: EXPLORATORY LAPAROTOMY;  Surgeon: Everitt Amber, MD;  Location: [facility];  Service: Gynecology;  Laterality: N/A;  . PARTIAL HYSTERECTOMY  1988  . SALPINGOOPHORECTOMY Bilateral 03/29/2014   Procedure: BILATERAL SALPINGO OOPHORECTOMY, OMENTECTOMY, DEBULKING;  Surgeon: Everitt Amber, MD;  Location: [facility];  Service: Gynecology;  Laterality: Bilateral;  . TONSILLECTOMY  1971  . TUBAL LIGATION    .  [facility]  11/24/2009    SOCIAL HISTORY:   Social History   Social History  . Marital status: Single    Spouse name: N/A  . Number of children: 1 D  . Years of education: N/A   Occupational History  . Retired    Social History Main Topics  . Smoking status: Former Smoker    Packs/day: 1.00    Years: 1.00    Types: Cigarettes    Quit date: 02/11/1982  . Smokeless tobacco: Never Used  . Alcohol use No  . Drug use: No  . Sexual activity: Not on file   Other Topics Concern  . Not on file   Social History Narrative  . No narrative on file    FAMILY HISTORY:   Family Status  Relation Status  . Mother Alive   arthritis, htn, DM  . Father Deceased at age 53   bladder cancer, stroke  . Sister Alive   breast cancer  . Sister Alive   htn  . Sister Alive   htn  . Sister Alive   htn  . Maternal Aunt Alive  . Cousin Alive  . Maternal Uncle Deceased at age infancy  . Maternal Grandmother Deceased at age 52   pneumonia  . Maternal Grandfather Deceased  . Paternal Grandmother Deceased  . Paternal Grandfather Deceased  . Maternal Aunt Alive  . Maternal Aunt Alive  . Maternal Aunt Alive  . Son Deceased at age 80   died of brain anerysm, after a 20 ft fall  . Brother Alive   htn    ROS:  A complete 10 system review of systems was obtained and was unremarkable apart from what is mentioned above.  PHYSICAL EXAMINATION:    VITALS:   Vitals:   11/28/15 0846  BP: (!) 142/80  Pulse: 80  Weight: 194  lb (88 kg)  Height: 5' 1.5" (1.562 m)    GEN:  Normal appears female in no acute distress.  Appears stated age. HEENT:  Normocephalic, atraumatic. The mucous membranes are moist. The superficial temporal arteries are without ropiness or tenderness. Cardiovascular: Regular rate and rhythm. Lungs: Clear to auscultation bilaterally. Neck/Heme: There are no carotid bruits noted bilaterally.  NEUROLOGICAL: Orientation:  The patient is alert and oriented x 3.     Cranial nerves: There is good facial symmetry.  Speech is fluent and clear. Soft palate rises symmetrically and there is no tongue deviation. Hearing is intact to conversational tone. Tone: Tone is good throughout. Sensation: Sensation is intact to light touch throughout Coordination:  The patient has no difficulty with RAM's or FNF bilaterally. Motor: Strength is 5/5 in the bilateral upper and and at least 5-/5 with give way weakness in the LE.  Shoulder shrug is equal and symmetric. There is no pronator drift.  There are no fasciculations noted. Gait and Station: The patient is able to ambulate without difficulty.   Lab Results  Component Value Date   WBC 5.8 11/20/2015   HGB 12.0 11/20/2015   HCT 36.7 11/20/2015   MCV 94.0 11/20/2015   PLT 104 (L) 11/20/2015   Lab Results  Component Value Date   TSH 1.119 10/13/2014   Lab Results  Component Value Date   IRON 29 (L) 07/28/2015   TIBC 465 (H) 07/28/2015   FERRITIN 10 (L) 07/28/2015      IMPRESSION/PLAN  1. RLS, secondary to iron def anemia  -had stopped IV iron and that is likely all she needs.  Sx's gradually getting worse but not as bad as were.  Will recheck iron.  -pt wants to restart neupro patch 1 mg.  May not need if faithful with IV iron but would like to restart.  Many samples given as in donut hole. 2.  F/u 4 months, sooner should new issues arise.

## 2015-11-28 ENCOUNTER — Ambulatory Visit (INDEPENDENT_AMBULATORY_CARE_PROVIDER_SITE_OTHER): Payer: Medicare Other | Admitting: Neurology

## 2015-11-28 ENCOUNTER — Other Ambulatory Visit (INDEPENDENT_AMBULATORY_CARE_PROVIDER_SITE_OTHER): Payer: Medicare Other

## 2015-11-28 ENCOUNTER — Telehealth: Payer: Self-pay | Admitting: Neurology

## 2015-11-28 ENCOUNTER — Encounter: Payer: Self-pay | Admitting: Neurology

## 2015-11-28 VITALS — BP 142/80 | HR 80 | Ht 61.5 in | Wt 194.0 lb

## 2015-11-28 DIAGNOSIS — D508 Other iron deficiency anemias: Secondary | ICD-10-CM | POA: Diagnosis not present

## 2015-11-28 LAB — IBC PANEL
Iron: 61 ug/dL (ref 42–145)
Saturation Ratios: 16.2 % — ABNORMAL LOW (ref 20.0–50.0)
Transferrin: 269 mg/dL (ref 212.0–360.0)

## 2015-11-28 MED ORDER — ROTIGOTINE 1 MG/24HR TD PT24: 1.0000 | MEDICATED_PATCH | Freq: Every day | TRANSDERMAL | 0 refills | Status: DC

## 2015-11-28 NOTE — Patient Instructions (Signed)
1. Your provider has requested that you have labwork completed today. Please go to [facility] ([phone]) on the second floor of this building before leaving the office today. You do not need to check in. If you are not called within 15 minutes please check with the front desk.

## 2015-11-28 NOTE — Telephone Encounter (Signed)
-----   Message from [place], DO sent at 11/28/2015 12:57 PM EDT ----- Let pt know that iron is definitely better than it was, although % saturation ratio is still a little low.  Try the neupro and see how she does with faithful oral iron but my suspicion is that we will need the IV iron again.

## 2015-11-28 NOTE — Telephone Encounter (Signed)
Patient made aware. She will restart oral iron supplements.

## 2015-12-06 ENCOUNTER — Other Ambulatory Visit: Payer: Self-pay | Admitting: Oncology

## 2015-12-07 ENCOUNTER — Ambulatory Visit (HOSPITAL_BASED_OUTPATIENT_CLINIC_OR_DEPARTMENT_OTHER): Payer: Medicare Other | Admitting: Oncology

## 2015-12-07 ENCOUNTER — Encounter: Payer: Self-pay | Admitting: Oncology

## 2015-12-07 ENCOUNTER — Other Ambulatory Visit (HOSPITAL_BASED_OUTPATIENT_CLINIC_OR_DEPARTMENT_OTHER): Payer: Medicare Other

## 2015-12-07 ENCOUNTER — Ambulatory Visit: Payer: Medicare Other

## 2015-12-07 VITALS — BP 138/50 | HR 66 | Temp 97.6°F | Resp 18 | Ht 61.5 in | Wt 194.0 lb

## 2015-12-07 DIAGNOSIS — C482 Malignant neoplasm of peritoneum, unspecified: Secondary | ICD-10-CM

## 2015-12-07 DIAGNOSIS — D6959 Other secondary thrombocytopenia: Secondary | ICD-10-CM

## 2015-12-07 DIAGNOSIS — R161 Splenomegaly, not elsewhere classified: Secondary | ICD-10-CM | POA: Diagnosis not present

## 2015-12-07 DIAGNOSIS — G2581 Restless legs syndrome: Secondary | ICD-10-CM

## 2015-12-07 DIAGNOSIS — T451X5A Adverse effect of antineoplastic and immunosuppressive drugs, initial encounter: Secondary | ICD-10-CM

## 2015-12-07 DIAGNOSIS — Z95828 Presence of other vascular implants and grafts: Secondary | ICD-10-CM

## 2015-12-07 DIAGNOSIS — Z6836 Body mass index (BMI) 36.0-36.9, adult: Secondary | ICD-10-CM | POA: Diagnosis not present

## 2015-12-07 DIAGNOSIS — G620 Drug-induced polyneuropathy: Secondary | ICD-10-CM

## 2015-12-07 DIAGNOSIS — C569 Malignant neoplasm of unspecified ovary: Secondary | ICD-10-CM

## 2015-12-07 DIAGNOSIS — Z5111 Encounter for antineoplastic chemotherapy: Secondary | ICD-10-CM

## 2015-12-07 LAB — CBC WITH DIFFERENTIAL/PLATELET
BASO%: 0 % (ref 0.0–2.0)
Basophils Absolute: 0 10*3/uL (ref 0.0–0.1)
EOS%: 1.7 % (ref 0.0–7.0)
Eosinophils Absolute: 0.1 10*3/uL (ref 0.0–0.5)
HCT: 34.8 % (ref 34.8–46.6)
HGB: 11.7 g/dL (ref 11.6–15.9)
LYMPH%: 28.6 % (ref 14.0–49.7)
MCH: 31.5 pg (ref 25.1–34.0)
MCHC: 33.6 g/dL (ref 31.5–36.0)
MCV: 93.8 fL (ref 79.5–101.0)
MONO#: 0.2 10*3/uL (ref 0.1–0.9)
MONO%: 4.4 % (ref 0.0–14.0)
NEUT#: 2.7 10*3/uL (ref 1.5–6.5)
NEUT%: 65.3 % (ref 38.4–76.8)
Platelets: 101 10*3/uL — ABNORMAL LOW (ref 145–400)
RBC: 3.71 10*6/uL (ref 3.70–5.45)
RDW: 17.1 % — ABNORMAL HIGH (ref 11.2–14.5)
WBC: 4.1 10*3/uL (ref 3.9–10.3)
lymph#: 1.2 10*3/uL (ref 0.9–3.3)

## 2015-12-07 LAB — COMPREHENSIVE METABOLIC PANEL
ALT: 82 U/L — ABNORMAL HIGH (ref 0–55)
AST: 55 U/L — ABNORMAL HIGH (ref 5–34)
Albumin: 3.7 g/dL (ref 3.5–5.0)
Alkaline Phosphatase: 128 U/L (ref 40–150)
Anion Gap: 9 mEq/L (ref 3–11)
BUN: 12.5 mg/dL (ref 7.0–26.0)
CO2: 22 mEq/L (ref 22–29)
Calcium: 7.8 mg/dL — ABNORMAL LOW (ref 8.4–10.4)
Chloride: 111 mEq/L — ABNORMAL HIGH (ref 98–109)
Creatinine: 0.7 mg/dL (ref 0.6–1.1)
EGFR: 89 mL/min/{1.73_m2} — ABNORMAL LOW (ref >90)
Glucose: 145 mg/dl — ABNORMAL HIGH (ref 70–140)
Potassium: 3.8 mEq/L (ref 3.5–5.1)
Sodium: 142 mEq/L (ref 136–145)
Total Bilirubin: 0.63 mg/dL (ref 0.20–1.20)
Total Protein: 7 g/dL (ref 6.4–8.3)

## 2015-12-07 MED ORDER — HYDROCODONE-ACETAMINOPHEN 5-325 MG PO TABS: 1.0000 | ORAL_TABLET | Freq: Four times a day (QID) | ORAL | 0 refills | Status: DC | PRN

## 2015-12-07 MED ORDER — SODIUM CHLORIDE 0.9 % IJ SOLN
10.0000 mL | INTRAMUSCULAR | Status: DC | PRN
  Administered 2015-12-07: 10 mL via INTRAVENOUS
  Filled 2015-12-07: qty 10

## 2015-12-07 MED ORDER — HEPARIN SOD (PORK) LOCK FLUSH 100 UNIT/ML IV SOLN
500.0000 [IU] | Freq: Once | INTRAVENOUS | Status: AC | PRN
  Administered 2015-12-07: 500 [IU] via INTRAVENOUS
  Filled 2015-12-07: qty 5

## 2015-12-07 MED ORDER — ONDANSETRON HCL 8 MG PO TABS: 8.0000 mg | ORAL_TABLET | Freq: Three times a day (TID) | ORAL | 1 refills | Status: DC | PRN

## 2015-12-07 NOTE — Progress Notes (Signed)
OFFICE PROGRESS NOTE   December 07, 2015   Physicians:Emma Rossi;Howell, Bryn Gulling, MD (PCP), Mardene Speak, M.Wert, P.Nahser, M.Stark/ J.Mann, R.Tat, R.Gioffre  INTERVAL HISTORY:  Patient is seen, together with husband, in continuing attention to recurrent primary peritoneal carcinoma, having had cycle 3 single agent carboplatin on 11-20-15, dose reduced to AUC = 3 and requiring neulasta due to cytopenias. Last imaging was CT AP 09-27-15.  Skin testing done prior to each carboplatin.  Patient is tolerating this chemotherapy fairly well, neulasta aches improved with prn hydrocodone last treatment. She notices more neuropathy left foot only, does not clearly describe claudication (note also DM and thecal sac compression). Possibly related to  rotigotine patch for restless legs, she has had more nausea with occasional vomiting, more frequent bowel movements and unusual dreams, however restless legs are definitely improved with the patch daily. She denies abdominal pain, increased SOB, problems with PAC, any bleeding. Energy is poor, tho she has done a little yard work, and she sleeps only ~ an hour at a time regardless.  Remainder of 10 point Review of Systems negative.      PAC  Genetics testing from 02-2014 normal ([employer] panel) CA 125 1195 at diagnosis. 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 Urine culture by PCP showed no infection and she was referred to Dr Clois Comber, who adjusted medications with great improvement in the cough. Despite improved cough, she continued with some urinary incontinence. She saw Dr Ronita Hipps in 10-2013, with pelvic mass on exam and CA 125 1195 on 11-08-13. She had CT AP 11-12-13, which showed large ascites, irregular and shrunken liver, mild splenomegaly, increased soft tissue in pelvis bilaterally and anterior  mid and lower peritoneum, small left pleural effusion. She saw Dr Denman George in consultation on 11-15-13, who agreed that this was likely gyn malignancy. She had US paracentesis of 4.1 liters on 11-18-13, with cytology ([id number]) and immunohistochemical stains consistent with serous carcinoma of Mullerian tract primary, favor ovarian, ER patchy positive. She had second paracentesis for 3.6 liters on 12-03-13. Gyn oncology recommended initial treatment with chemotherapy. She had first carboplatin and taxol on 12-07-13, with carboplatin dosed at AUC=4 and taxol at 135 mg/m2 due to concern for cirrhosis. She has needed gCSF support and even so was admitted with febrile neutropenia after cycle 3. CA125 was 1938 on 12-01-13 as baseline for chemotherapy, down to 130 prior to cycle 4 and 45 after cycle 5. 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 Chemo resumed 04-19-14, regimen changed to dose dense carbo taxol with granix days 2,3,9,16. She tolerated the dose dense regimen better overall, tho cycle 3 days 8 and 15 were held with neutropenia, symptomatic anemia and fatigue. CT AP 07-04-14 had no obvious recurrent or persistent cancer. She did not go onto GOG 0225 due to [facility] research limitations when she was eligible. CA 125 was 45 in 08-2015 and 47 in 09-2015. CT AP 09-27-15 showed a new small bowel mesenteric nodule 1 cm and a left external iliac node now 1.2 cm compared with 5 mm in 03-2015. Chemo resumed with single agent carboplatin on 10-10-15, dosed at AUC =3 due to prior problems with cytopenias, with neulasta. .  Objective:  Vital signs in last 24 hours:  BP (!) 138/50 (BP Location: Left Arm, Patient Position: Sitting) Comment: Made nurse aware of pt. BP  Pulse 66   Temp 97.6 F (36.4 C) (Oral)   Resp 18   Ht 5' 1.5" (1.562 m)   Wt 194 lb (88  kg)   SpO2 99%   BMI 36.06 kg/m   Weight down 1 lb.  Alert, oriented and appropriate. Ambulatory without difficulty.  No alopecia  HEENT:PERRL, sclerae not icteric. Oral mucosa moist without lesions, posterior pharynx clear.  Neck supple. No JVD.  Lymphatics:no cervical,supraclavicular, axillary or inguinal adenopathy Resp: clear to auscultation bilaterally and normal percussion bilaterally Cardio: regular rate and rhythm. No gallop. GI: abdomen obese, soft, nontender, not distended, cannot appreciate mass or organomegaly. Normally active bowel sounds.  Musculoskeletal/ Extremities: without pitting edema, cords, tenderness Neuro: peripheral neuropathy left foot > right. PSYCH appropriate mood and affect Skin without rash, ecchymosis, petechiae. Portacath-without erythema or tenderness  Lab Results:  Results for orders placed or performed in visit on 12/07/15  CBC with Differential  Result Value Ref Range   WBC 4.1 3.9 - 10.3 10e3/uL   NEUT# 2.7 1.5 - 6.5 10e3/uL   HGB 11.7 11.6 - 15.9 g/dL   HCT 34.8 34.8 - 46.6 %   Platelets 101 (L) 145 - 400 10e3/uL   MCV 93.8 79.5 - 101.0 fL   MCH 31.5 25.1 - 34.0 pg   MCHC 33.6 31.5 - 36.0 g/dL   RBC 3.71 3.70 - 5.45 10e6/uL   RDW 17.1 (H) 11.2 - 14.5 %   lymph# 1.2 0.9 - 3.3 10e3/uL   MONO# 0.2 0.1 - 0.9 10e3/uL   Eosinophils Absolute 0.1 0.0 - 0.5 10e3/uL   Basophils Absolute 0.0 0.0 - 0.1 10e3/uL   NEUT% 65.3 38.4 - 76.8 %   LYMPH% 28.6 14.0 - 49.7 %   MONO% 4.4 0.0 - 14.0 %   EOS% 1.7 0.0 - 7.0 %   BASO% 0.0 0.0 - 2.0 %  Comprehensive metabolic panel  Result Value Ref Range   Sodium 142 136 - 145 mEq/L   Potassium 3.8 3.5 - 5.1 mEq/L   Chloride 111 (H) 98 - 109 mEq/L   CO2 22 22 - 29 mEq/L   Glucose 145 (H) 70 - 140 mg/dl   BUN 12.5 7.0 - 26.0 mg/dL   Creatinine 0.7 0.6 - 1.1 mg/dL   Total Bilirubin 0.63 0.20 - 1.20 mg/dL   Alkaline Phosphatase 128 40 - 150 U/L   AST 55 (H) 5 - 34 U/L   ALT 82 (H) 0 - 55 U/L   Total  Protein 7.0 6.4 - 8.3 g/dL   Albumin 3.7 3.5 - 5.0 g/dL   Calcium 7.8 (L) 8.4 - 10.4 mg/dL   Anion Gap 9 3 - 11 mEq/L   EGFR 89 (L) >90 ml/min/1.73 m2   CA 125 on 11-20-15 was 41, this having been 53 on 10-10-15  Iron studies repeated by Dr Tat on 11-28-15 in follow up of restless legs, with serum iron 61, %sat 16.   Studies/Results:  Will get CT AP after cycle 4 carbo , requested ~ 12-20-15  Medications: I have reviewed the patient's current medications. Rotigotine patch possible side effects reviewed.  Patient agrees to try oral iron per Dr Doristine Devoid recommendation. I would not give IV iron with serum iron 60 now. Hydrocodone rewritten to use for aches after neulasta injections.  DISCUSSION Clinically stable, CA 125 slightly lower, but difficult to gauge response to present chemo well given  other comorbid conditions. Will repeat CT AP after one additional cycle of the carboplatin and I will see her back after that imaging. PARP inhibitor could be option if responding. Note also ER +.   Assessment/Plan:  1.high grade serous primary peritoneal carcinoma: Neoadjuvant chemotherapy with carboplatin and taxol 12-07-13 thru cycle 5 on 03-08-14. Interval optimal debulking of what was still large amount of disease, with residual TNTC <75m tumor areas. Additional chemo given x 3 cycles after debulking, thru 45-80-99 complicated by cytopenias and neuropathy. Rising CA 125 and small volume recurrent disease documented 09-2015. She has had 3 cycles of carboplatin, due cycle 4 on 12-11-15 if ANC >=1.5 and plt >=100k. CT AP ~ 11-8 and follow up after that. PARP inhibitor may be option if responds to this platinum treatment. Note also ER +. 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 Known to Dr MLucio Edward 3.PAC in  4.diabetes on oral agent, has been following blood sugars at home.  5. 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 Stress urinary incontinence. Chronic cough resolved with interventions by pulmonary. Past tobacco 10.restless legs:better with interventions by Dr Tat and since full IV iron repletion. Samples of rotigotine patch from Dr Tat as insurance not covering presently 11.chemo peripheral neuropathy + baseline neuropathy in feet thought related to DM, also thecal sac compression 12.chronic orthopedic problems including back. Known to Dr GGladstone Lighter Chiropractic interventions also helping and fine to continue 15.obesity, BMI 36 16.flu vaccine 11-13-15 17.neulasta aches: hydocodone helpful and she is judicious with use, OK for this   All questions answered and patient / husband are in agreement with recommendations and plans. Chemo and neulasta orders confirmed. Time spent 25 min including >50% counseling and coordination of care.    LEvlyn Clines MD   12/07/2015, 5:31 PM

## 2015-12-08 ENCOUNTER — Other Ambulatory Visit: Payer: Self-pay | Admitting: Oncology

## 2015-12-09 DIAGNOSIS — Z5111 Encounter for antineoplastic chemotherapy: Secondary | ICD-10-CM | POA: Insufficient documentation

## 2015-12-11 ENCOUNTER — Ambulatory Visit: Payer: Medicare Other

## 2015-12-11 ENCOUNTER — Other Ambulatory Visit: Payer: Self-pay | Admitting: Oncology

## 2015-12-11 ENCOUNTER — Other Ambulatory Visit (HOSPITAL_BASED_OUTPATIENT_CLINIC_OR_DEPARTMENT_OTHER): Payer: Medicare Other

## 2015-12-11 VITALS — BP 135/60 | HR 64 | Temp 98.3°F | Resp 17

## 2015-12-11 DIAGNOSIS — C482 Malignant neoplasm of peritoneum, unspecified: Secondary | ICD-10-CM | POA: Diagnosis not present

## 2015-12-11 DIAGNOSIS — Z95828 Presence of other vascular implants and grafts: Secondary | ICD-10-CM

## 2015-12-11 DIAGNOSIS — T50905A Adverse effect of unspecified drugs, medicaments and biological substances, initial encounter: Secondary | ICD-10-CM

## 2015-12-11 LAB — CBC WITH DIFFERENTIAL/PLATELET
BASO%: 0.4 % (ref 0.0–2.0)
Basophils Absolute: 0 10*3/uL (ref 0.0–0.1)
EOS%: 1.6 % (ref 0.0–7.0)
Eosinophils Absolute: 0.1 10*3/uL (ref 0.0–0.5)
HCT: 37.6 % (ref 34.8–46.6)
HGB: 12.4 g/dL (ref 11.6–15.9)
LYMPH%: 32.3 % (ref 14.0–49.7)
MCH: 31.7 pg (ref 25.1–34.0)
MCHC: 32.9 g/dL (ref 31.5–36.0)
MCV: 96.3 fL (ref 79.5–101.0)
MONO#: 0.3 10*3/uL (ref 0.1–0.9)
MONO%: 6.9 % (ref 0.0–14.0)
NEUT#: 2.5 10*3/uL (ref 1.5–6.5)
NEUT%: 58.8 % (ref 38.4–76.8)
Platelets: 114 10*3/uL — ABNORMAL LOW (ref 145–400)
RBC: 3.9 10*6/uL (ref 3.70–5.45)
RDW: 18 % — ABNORMAL HIGH (ref 11.2–14.5)
WBC: 4.3 10*3/uL (ref 3.9–10.3)
lymph#: 1.4 10*3/uL (ref 0.9–3.3)

## 2015-12-11 LAB — COMPREHENSIVE METABOLIC PANEL
ALT: 91 U/L — ABNORMAL HIGH (ref 0–55)
AST: 70 U/L — ABNORMAL HIGH (ref 5–34)
Albumin: 3.8 g/dL (ref 3.5–5.0)
Alkaline Phosphatase: 111 U/L (ref 40–150)
Anion Gap: 10 mEq/L (ref 3–11)
BUN: 13.3 mg/dL (ref 7.0–26.0)
CO2: 24 mEq/L (ref 22–29)
Calcium: 8 mg/dL — ABNORMAL LOW (ref 8.4–10.4)
Chloride: 107 mEq/L (ref 98–109)
Creatinine: 0.8 mg/dL (ref 0.6–1.1)
EGFR: 80 mL/min/{1.73_m2} — ABNORMAL LOW (ref >90)
Glucose: 114 mg/dl (ref 70–140)
Potassium: 3.8 mEq/L (ref 3.5–5.1)
Sodium: 142 mEq/L (ref 136–145)
Total Bilirubin: 0.67 mg/dL (ref 0.20–1.20)
Total Protein: 7.3 g/dL (ref 6.4–8.3)

## 2015-12-11 MED ORDER — SODIUM CHLORIDE 0.9% FLUSH
10.0000 mL | INTRAVENOUS | Status: DC | PRN
  Administered 2015-12-11: 10 mL
  Filled 2015-12-11: qty 10

## 2015-12-11 MED ORDER — HEPARIN SOD (PORK) LOCK FLUSH 100 UNIT/ML IV SOLN
500.0000 [IU] | Freq: Once | INTRAVENOUS | Status: AC | PRN
  Administered 2015-12-11: 500 [IU]
  Filled 2015-12-11: qty 5

## 2015-12-11 MED ORDER — DEXAMETHASONE 4 MG PO TABS
ORAL_TABLET | ORAL | Status: AC
  Filled 2015-12-11: qty 1

## 2015-12-11 MED ORDER — FAMOTIDINE 20 MG PO TABS
20.0000 mg | ORAL_TABLET | Freq: Once | ORAL | Status: AC
  Administered 2015-12-11: 20 mg via ORAL

## 2015-12-11 MED ORDER — SODIUM CHLORIDE 0.9 % IJ SOLN
10.0000 mL | INTRAMUSCULAR | Status: DC | PRN
  Administered 2015-12-11: 10 mL via INTRAVENOUS
  Filled 2015-12-11: qty 10

## 2015-12-11 MED ORDER — DEXAMETHASONE 4 MG PO TABS
2.0000 mg | ORAL_TABLET | Freq: Once | ORAL | Status: AC
  Administered 2015-12-11: 2 mg via ORAL

## 2015-12-11 MED ORDER — CARBOPLATIN CHEMO INTRADERMAL TEST DOSE 100MCG/0.02ML
100.0000 ug | Freq: Once | INTRADERMAL | Status: AC
  Administered 2015-12-11: 100 ug via INTRADERMAL
  Filled 2015-12-11: qty 0.02

## 2015-12-11 MED ORDER — FAMOTIDINE 20 MG PO TABS
ORAL_TABLET | ORAL | Status: AC
  Filled 2015-12-11: qty 1

## 2015-12-11 NOTE — Progress Notes (Signed)
OK to treat with AST and ALT values today per MD Marko Plume

## 2015-12-11 NOTE — Patient Instructions (Addendum)
[facility] Discharge Instructions for Patients Receiving Chemotherapy  TODAY YOU HAD A POSITIVE REACTION TO YOUR CARBOPLATIN TEST DOSE WITH MILD REDNESS ON ARM AND ITCHING. YOU WERE GIVEN PEPCID AND DECADRON.  NO CHEMO TODAY AND NO APPOINTMENT FOR INJECTION ON Dec 13, 2015.  DR Marko Plume WILL SEE YOU NEXT WEEK AFTER YOUR CT SCAN.

## 2015-12-11 NOTE — Progress Notes (Signed)
1346: Pt has positive reaction to Carboplatin skin test; a wheel greater than 5 mm noted with itching to site. Dr. Marko Plume aware and pt to not receive treatment today.

## 2015-12-13 ENCOUNTER — Ambulatory Visit: Payer: Medicare Other

## 2015-12-20 ENCOUNTER — Ambulatory Visit (HOSPITAL_COMMUNITY)
Admission: RE | Admit: 2015-12-20 | Discharge: 2015-12-20 | Disposition: A | Discharge status: Home / Self Care | Payer: Medicare Other | Source: Ambulatory Visit | Attending: Oncology | Admitting: Oncology

## 2015-12-20 DIAGNOSIS — K7460 Unspecified cirrhosis of liver: Secondary | ICD-10-CM | POA: Diagnosis not present

## 2015-12-20 DIAGNOSIS — R590 Localized enlarged lymph nodes: Secondary | ICD-10-CM | POA: Insufficient documentation

## 2015-12-20 DIAGNOSIS — K5730 Diverticulosis of large intestine without perforation or abscess without bleeding: Secondary | ICD-10-CM | POA: Insufficient documentation

## 2015-12-20 DIAGNOSIS — C482 Malignant neoplasm of peritoneum, unspecified: Secondary | ICD-10-CM | POA: Diagnosis present

## 2015-12-20 DIAGNOSIS — R161 Splenomegaly, not elsewhere classified: Secondary | ICD-10-CM | POA: Diagnosis not present

## 2015-12-20 DIAGNOSIS — K769 Liver disease, unspecified: Secondary | ICD-10-CM | POA: Diagnosis not present

## 2015-12-20 MED ORDER — IOPAMIDOL (ISOVUE-300) INJECTION 61%
100.0000 mL | Freq: Once | INTRAVENOUS | Status: AC | PRN
  Administered 2015-12-20: 100 mL via INTRAVENOUS

## 2015-12-20 MED ORDER — IOPAMIDOL (ISOVUE-300) INJECTION 61%
30.0000 mL | Freq: Once | INTRAVENOUS | Status: AC | PRN
  Administered 2015-12-20: 30 mL via ORAL

## 2015-12-22 ENCOUNTER — Encounter: Payer: Self-pay | Admitting: Oncology

## 2015-12-22 ENCOUNTER — Other Ambulatory Visit: Payer: Self-pay | Admitting: Oncology

## 2015-12-22 ENCOUNTER — Ambulatory Visit (HOSPITAL_BASED_OUTPATIENT_CLINIC_OR_DEPARTMENT_OTHER): Payer: Medicare Other | Admitting: Oncology

## 2015-12-22 ENCOUNTER — Other Ambulatory Visit (HOSPITAL_BASED_OUTPATIENT_CLINIC_OR_DEPARTMENT_OTHER): Payer: Medicare Other

## 2015-12-22 VITALS — BP 149/63 | HR 68 | Temp 98.3°F | Resp 18 | Ht 61.5 in | Wt 189.9 lb

## 2015-12-22 DIAGNOSIS — R161 Splenomegaly, not elsewhere classified: Secondary | ICD-10-CM | POA: Diagnosis not present

## 2015-12-22 DIAGNOSIS — M8580 Other specified disorders of bone density and structure, unspecified site: Secondary | ICD-10-CM | POA: Insufficient documentation

## 2015-12-22 DIAGNOSIS — E119 Type 2 diabetes mellitus without complications: Secondary | ICD-10-CM

## 2015-12-22 DIAGNOSIS — G620 Drug-induced polyneuropathy: Secondary | ICD-10-CM

## 2015-12-22 DIAGNOSIS — Z6836 Body mass index (BMI) 36.0-36.9, adult: Secondary | ICD-10-CM

## 2015-12-22 DIAGNOSIS — C482 Malignant neoplasm of peritoneum, unspecified: Secondary | ICD-10-CM

## 2015-12-22 DIAGNOSIS — K7581 Nonalcoholic steatohepatitis (NASH): Secondary | ICD-10-CM

## 2015-12-22 DIAGNOSIS — Z95828 Presence of other vascular implants and grafts: Secondary | ICD-10-CM

## 2015-12-22 DIAGNOSIS — C569 Malignant neoplasm of unspecified ovary: Secondary | ICD-10-CM

## 2015-12-22 DIAGNOSIS — K7460 Unspecified cirrhosis of liver: Secondary | ICD-10-CM | POA: Diagnosis not present

## 2015-12-22 DIAGNOSIS — D6959 Other secondary thrombocytopenia: Secondary | ICD-10-CM

## 2015-12-22 LAB — COMPREHENSIVE METABOLIC PANEL
ALT: 115 U/L — ABNORMAL HIGH (ref 0–55)
AST: 82 U/L — ABNORMAL HIGH (ref 5–34)
Albumin: 4 g/dL (ref 3.5–5.0)
Alkaline Phosphatase: 121 U/L (ref 40–150)
Anion Gap: 10 mEq/L (ref 3–11)
BUN: 8.7 mg/dL (ref 7.0–26.0)
CO2: 24 mEq/L (ref 22–29)
Calcium: 8.7 mg/dL (ref 8.4–10.4)
Chloride: 107 mEq/L (ref 98–109)
Creatinine: 0.8 mg/dL (ref 0.6–1.1)
EGFR: 78 mL/min/{1.73_m2} — ABNORMAL LOW (ref >90)
Glucose: 189 mg/dl — ABNORMAL HIGH (ref 70–140)
Potassium: 4.8 mEq/L (ref 3.5–5.1)
Sodium: 141 mEq/L (ref 136–145)
Total Bilirubin: 0.75 mg/dL (ref 0.20–1.20)
Total Protein: 7.7 g/dL (ref 6.4–8.3)

## 2015-12-22 LAB — CBC WITH DIFFERENTIAL/PLATELET
BASO%: 0.9 % (ref 0.0–2.0)
Basophils Absolute: 0 10*3/uL (ref 0.0–0.1)
EOS%: 1.5 % (ref 0.0–7.0)
Eosinophils Absolute: 0.1 10*3/uL (ref 0.0–0.5)
HCT: 40.2 % (ref 34.8–46.6)
HGB: 13.1 g/dL (ref 11.6–15.9)
LYMPH%: 23.3 % (ref 14.0–49.7)
MCH: 31.5 pg (ref 25.1–34.0)
MCHC: 32.6 g/dL (ref 31.5–36.0)
MCV: 96.7 fL (ref 79.5–101.0)
MONO#: 0.2 10*3/uL (ref 0.1–0.9)
MONO%: 4.5 % (ref 0.0–14.0)
NEUT#: 3.4 10*3/uL (ref 1.5–6.5)
NEUT%: 69.8 % (ref 38.4–76.8)
Platelets: 128 10*3/uL — ABNORMAL LOW (ref 145–400)
RBC: 4.15 10*6/uL (ref 3.70–5.45)
RDW: 16.9 % — ABNORMAL HIGH (ref 11.2–14.5)
WBC: 4.9 10*3/uL (ref 3.9–10.3)
lymph#: 1.1 10*3/uL (ref 0.9–3.3)

## 2015-12-22 MED ORDER — LETROZOLE 2.5 MG PO TABS: 2.5000 mg | ORAL_TABLET | Freq: Every day | ORAL | 2 refills | Status: DC

## 2015-12-22 NOTE — Progress Notes (Signed)
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 She had reaction to [place] skin test such that cycle 4 carbo was not given; CT shows partial response in  lymph nodes and no new areas of disease.  Patient has felt better overall since being off chemo last few weeks, with less fatigue and less nausea. She has had some LE muscle cramps including severe cramps left upper thigh and right lower leg waking her from sleep last pm, also increased discomfort in feet possibly recurrent plantar fasciitis or the peripheral neuropathy. Restless legs at hs are definitely better with rotigotine patch, tho bizarre dreams also with this patch. Appetite is good, no SOB, no problems with PAC, bowels ok, no bleeding, no swelling LE, no fever or symptoms of infection. She can feel area of skin change posterior right pinna where previous MOHs surgery. Remainder of 10 point Review of Systems negative.    PAC flushed 12-20-15 Genetics testing from 02-2014 normal ([employer] panel) CA 125 1195 at diagnosis. 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 Urine culture by PCP showed no infection and she was referred to Dr Clois Comber, who adjusted medications with great improvement in the cough. Despite improved cough, she continued with some urinary incontinence. She saw Dr Ronita Hipps in 10-2013, with pelvic mass on exam and CA 125 1195 on 11-08-13. She had CT AP 11-12-13, which showed large ascites, irregular and shrunken liver, mild splenomegaly,  increased soft tissue in pelvis bilaterally and anterior mid and lower peritoneum, small left pleural effusion. She saw Dr Denman George in consultation on 11-15-13, who agreed that this was likely gyn malignancy. 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 She had second paracentesis for 3.6 liters on 12-03-13. Gyn oncology recommended initial treatment with chemotherapy. She had first carboplatin and taxol on 12-07-13, with carboplatin dosed at AUC=4 and taxol at 135 mg/m2 due to concern for cirrhosis. She has needed gCSF support and even so was admitted with febrile neutropenia after cycle 3. CA125 was 1938 on 12-01-13 as baseline for chemotherapy, down to 130 prior to cycle 4 and 45 after cycle 5. 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 Chemo resumed 04-19-14, regimen changed to dose dense carbo taxol with granix days 2,3,9,16. She tolerated the dose dense regimen better overall, tho cycle 3 days 8 and 15 were held with neutropenia, symptomatic anemia and fatigue. CT AP 07-04-14 had no obvious recurrent or persistent cancer. She did not go onto GOG 0225 due to [facility] research limitations when she was eligible. CA 125 was 45 in 08-2015 and 47 in 09-2015. CT AP 09-27-15 showed a new small bowel mesenteric nodule 1 cm and a left external iliac node now 1.2 cm compared with 5 mm in 03-2015. Chemo resumed with single agent carboplatin on 10-10-15, dosed at AUC =3 due to prior problems with cytopenias,  with neulasta. She had reaction to [place] skin test prior to cycle 4, such that carboplatin was discontinued. CT AP 12-20-15 showed decrease in size of the few lymph nodes and no progressive or new areas of  disease.  .   Objective:  Vital signs in last 24 hours:  BP (!) 149/63 (BP Location: Left Arm, Patient Position: Sitting)   Pulse 68   Temp 98.3 F (36.8 C) (Oral)   Resp 18   Ht 5' 1.5" (1.562 m)   Wt 189 lb 14.4 oz (86.1 kg)   SpO2 96%   BMI 35.30 kg/m   weight down 4 lbs. Looks comfortable, very pleasant as always. Alert, oriented and appropriate. Ambulatory without assistance.  No alopecia  HEENT:PERRL, sclerae not icteric. Oral mucosa moist without lesions, posterior pharynx clear.  Neck supple. No JVD.  Lymphatics:no cervical,supaclavicular or inguinal adenopathy Resp: clear to auscultation bilaterally and normal percussion bilaterally Cardio: regular rate and rhythm. No gallop. GI: abdomen obese, soft, nontender, not distended, no appreciable mass or organomegaly. Normally active bowel sounds.  Musculoskeletal/ Extremities: without pitting edema, cords, tenderness Neuro:peripheral neuropathy as previously. Otherwise nonfocal. PSYCH appropriate mood and affect Skin posterior upper right pinna with 1 x 1.5 cm slightly raised, slightly grayish skin lesion, no bleeding. Otherwise without rash, ecchymosis, petechiae Portacath-without erythema or tenderness  Lab Results:  Results for orders placed or performed in visit on 12/22/15  CBC with Differential  Result Value Ref Range   WBC 4.9 3.9 - 10.3 10e3/uL   NEUT# 3.4 1.5 - 6.5 10e3/uL   HGB 13.1 11.6 - 15.9 g/dL   HCT 40.2 34.8 - 46.6 %   Platelets 128 (L) 145 - 400 10e3/uL   MCV 96.7 79.5 - 101.0 fL   MCH 31.5 25.1 - 34.0 pg   MCHC 32.6 31.5 - 36.0 g/dL   RBC 4.15 3.70 - 5.45 10e6/uL   RDW 16.9 (H) 11.2 - 14.5 %   lymph# 1.1 0.9 - 3.3 10e3/uL   MONO# 0.2 0.1 - 0.9 10e3/uL   Eosinophils Absolute 0.1 0.0 - 0.5 10e3/uL   Basophils Absolute 0.0 0.0 - 0.1 10e3/uL   NEUT% 69.8 38.4 - 76.8 %   LYMPH% 23.3 14.0 - 49.7 %   MONO% 4.5 0.0 - 14.0 %   EOS% 1.5 0.0 - 7.0 %   BASO% 0.9 0.0 - 2.0 %  Comprehensive metabolic  panel  Result Value Ref Range   Sodium 141 136 - 145 mEq/L   Potassium 4.8 3.5 - 5.1 mEq/L   Chloride 107 98 - 109 mEq/L   CO2 24 22 - 29 mEq/L   Glucose 189 (H) 70 - 140 mg/dl   BUN 8.7 7.0 - 26.0 mg/dL   Creatinine 0.8 0.6 - 1.1 mg/dL   Total Bilirubin 0.75 0.20 - 1.20 mg/dL   Alkaline Phosphatase 121 40 - 150 U/L   AST 82 (H) 5 - 34 U/L   ALT 115 (H) 0 - 55 U/L   Total Protein 7.7 6.4 - 8.3 g/dL   Albumin 4.0 3.5 - 5.0 g/dL   Calcium 8.7 8.4 - 10.4 mg/dL   Anion Gap 10 3 - 11 mEq/L   EGFR 78 (L) >90 ml/min/1.73 m2   CA 125 was 41 on 11-20-15  CYTOLOGY ascites 11-2013 Accession: [id number] Received: 11/18/2013 Rowe Robert, PA-C DOB: 11/24/1945 Age: 35 Gender: F Reported: 11/22/2013 [address]  CYTOPATHOLOGY REPORT Adequacy Reason Satisfactory For Evaluation. Diagnosis PERITONEAL/ASCITIC FLUID (SPECIMEN 1 OF 1 COLLECTED 11/18/13): SEROUS CARCINOMA,  Source Peritoneal/Ascitic Fluid, (specimen 1 of 1 collected 11/18/13)  Comment Immunostains were performed and the tumor cells show the following immunoprofile: CK-7: positive WT-1: positive p53: positive ER: patchy positive p16: patchy positive CK-20: negative Controls are appropriate.   Studies/Results:   EXAM: CT ABDOMEN AND PELVIS WITH CONTRAST  TECHNIQUE: Multidetector CT imaging of the abdomen and pelvis was performed using the standard protocol following bolus administration of intravenous contrast.  CONTRAST:  147m ISOVUE-300 IOPAMIDOL (ISOVUE-300) INJECTION 61%  COMPARISON:  09/27/2015 CT abdomen/pelvis.  FINDINGS: Lower chest: No significant pulmonary nodules or acute consolidative airspace disease.  Hepatobiliary: Liver surface is diffusely irregular and there is relative hypertrophy of the left liver lobe, consistent with cirrhosis, unchanged. Hypodense 0.6 cm lateral segment left liver lobe lesion (series 2/ image 15) is stable and too small to characterize. No new liver lesions. Normal  gallbladder with no radiopaque cholelithiasis. No biliary ductal dilatation.  Pancreas: Normal, with no mass or duct dilation.  Spleen: Mild splenomegaly (craniocaudal splenic length 13.9 cm, unchanged). No splenic mass.  Adrenals/Urinary Tract: Normal adrenals. Stable mild asymmetric atrophy of the left kidney with mild scarring of the anterior upper left kidney. No hydronephrosis. No renal mass. Normal bladder.  Stomach/Bowel: Grossly normal stomach. Normal caliber small bowel with no small bowel wall thickening. Normal appendix. Mild sigmoid diverticulosis, with no large bowel wall thickening or pericolonic fat stranding.  Vascular/Lymphatic: Normal caliber abdominal aorta. Patent portal, splenic, hepatic and renal veins. Mildly enlarged 1.0 cm left external iliac node (series 2/ image 69), previously 1.2 cm, slightly decreased. No additional pathologically enlarged abdominopelvic nodes.  Reproductive: Status post hysterectomy, with no abnormal findings at the vaginal cuff. No adnexal mass.  Other: No pneumoperitoneum, ascites or focal fluid collection. Mildly enlarged 1.0 cm lower mesenteric nodule (series 2/ image 55), previously 1.2 cm using similar measurement technique, slightly decreased. No new peritoneal nodules.  Musculoskeletal: No aggressive appearing focal osseous lesions. Moderate thoracolumbar spondylosis, with stable chronic prominent endplate deformity in the superior L3 vertebral body, favor degenerative. Partially visualized intact appearing bilateral breast prostheses.  IMPRESSION: 1. Mild left external iliac lymphadenopathy is slightly decreased. Mildly enlarged lower mesenteric nodule is slightly decreased. 2. No new or progressive metastatic disease in the abdomen or pelvis . 3. Cirrhosis. Stable subcentimeter hypodense left liver lobe lesion. No new liver lesions. 4. Stable mild splenomegaly.  No ascites. 5. Mild sigmoid  diverticulosis.   PACs images reviewed by MD    Medications: I have reviewed the patient's current medications.  DISCUSSION Carbo test dose reaction discussed. She could have carbo desensitization in future if necessary to retreat with that agent, but a number of other agents that she has not received yet.  Note carbo maximum dose tolerated  AUC = 3 , with neulasta. Treatment choices also constrained by previous MI, cirrhosis with splenomegaly, peripheral neuropathy.   Discussed CT findings, which are improved with the 3 cycles of dose reduced carboplatin that she did receive, with minimal disease apparent.   Options for management at this point discussed: observation, PARP inhibitor, estrogen blocker, other chemo.  She prefers some treatment to treatment break, prefers to avoid chemo until necessary. We have discussed fairly recent of PARP inhibitors for recurrent primary peritoneal carcinoma that has responded to platinum agent, whether or not BRCA +. She understands that we have generally been able to get assistance with these very expensive drugs, that there may be a number of associated side effects including cytopenias, pneumonitis, GI effects, and rarely acute  leukemia. Another option that might be better tolerated would be an estrogen blocker, as the initial cytology from malignant ascites was ER + (that path report copied above). We have discussed mechanism of action of tamoxifen and aromatase inhibitors, possible side effects of both of those classes of medications and availability of generics. She recalls having bone density scan at [facility] with recent mammogram "osteopenia", tho I do not find that report in EMR now.   I have mentioned short treatment breaks, or changing to another AI, or changing to tamoxifen if arthralgias are difficult with letrozole. At conclusion of discussion, patient prefers to try aromatase inhibitor. Will start letrozole 2.5 mg daily, prescription to her  phamacy. She does not presently have insurance coverage for meds, in "donut hole", but should resume in Jan and is willing to purchase letrozole if needed.   Message sent to [place] and breast navigators ? assistance.  Discussed interventions for LE cramps. Suggested 16 oz sports drink daily. May need to follow up with podiatrist for plantar fasciitis.  Patient aware that she will be followed by another medical oncologist after first of year. I will see her back in 5-6 weeks to follow up on new letrozole, and she knows to call prior if any concerns.   Assessment/Plan:  1.high grade serous primary peritoneal carcinoma: Neoadjuvant chemotherapy with carboplatin and taxol 12-07-13 thru cycle 5 on 03-08-14. Interval optimal debulking of what was still large amount of disease, with residual TNTC <71m tumor areas. Additional chemo given x 3 cycles after debulking, thru 49-47-65 complicated by cytopenias and neuropathy.  Rising CA 125 and small volume recurrent disease documented 09-2015. She had 3 additional cycles of carboplatin thru 11-20-15, then [place] reaction. Partial response by CT 12-20-15, minimal disease by imaging.  Tho by criteria she is eligible for PARP inhibitor, multiple comorbid conditions including cytopenias make side effect profile of estrogen blocker more appealing at this point, as disease was ER positive on previous testing. Will begin letrozole.  2.hepatic cirrhosis, with mild splenomegaly unchanged on CT 12-20-15/ suggestion of portal HTN: likely NASH. Known to Dr MLucio Edward 3.Reaction to carboplatin skin test 12-11-15. She would need desensitization if carboplatin required in future.  4.diabetes on oral agent, has been following blood sugars at home.  5. 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 Stress urinary incontinence. Chronic cough resolved with interventions by pulmonary. Pasttobacco 10.restless legs:better with interventions by Dr Tat and since full IV iron repletion. Samples of rotigotine patch from Dr Tat as insurance not covering 11.chemo peripheral neuropathy + baseline neuropathy in feet thought related to DM, also thecal sac compression 12.chronic orthopedic problems including back. Known to Dr GGladstone Lighter Chiropractic interventions also helping and fine to continue 15.obesity, BMI 36 16.flu vaccine 11-13-15 17.neulasta aches: hydocodone helpful and she is judicious with use 18.PAC in : flushed with CT 12-20-15. Needs to be flushed every 6-8 weeks when not otherwise used.  19.Skin lesion right pinna in area of previous MOHs. Patient will call dermatologist for follow up now. 20. Osteopenia by bone density scan per patient. Will try to get that report, pertinent with letrozole. [facility]  All questions answered  and patient is in agreement with recommendations and plans above. Time spent 30 min including >50% counseling and coordination of care. Route PCP, cc Dr Leone Payor, MD   12/22/2015, 6:23 PM

## 2015-12-26 ENCOUNTER — Telehealth: Payer: Self-pay

## 2015-12-26 NOTE — Telephone Encounter (Signed)
-----   Message from Gordy Levan, MD sent at 12/26/2015 10:41 AM EST ----- Please let patient know that we have not located any patient assistance for the letrozole, however fortunately it is not too expensive. thanks ----- Message ----- From: Enis Gash, [facility] Sent: 12/26/2015  10:21 AM To: Rockwell Germany, RN, Mauro Kaufmann, RN, #  There are currently no foundation monies for this disease state (primary peritoneal or ovarian) however letrozole is pretty inexpensive.   I don't think her copay will be more than $20-30 at [facility]) for a 30-day supply or patient can get a GoodRx copay card at [email or web address] card for a $27 price at [facility].   [facility] does not take the GoodRx copay cards since they already offer discounted prices.  Hope this helps, Denyse Amass   ----- Message ----- From: Mauro Kaufmann, RN Sent: 12/26/2015   8:30 AM To: Rockwell Germany, RN, Gordy Levan, MD, #  I sent a message to [place] our financial counselor to see if she can help. Thanks, Dawn  ----- Message ----- From: Gordy Levan, MD Sent: 12/22/2015   7:37 PM To: Rockwell Germany, RN, Mauro Kaufmann, RN, #  I'm not sure who to ask, so please pass this along if not you-  Patient Olivia Benson in donut hole with insurance Needs to start letrozole for primary peritoneal cancer. Any assistance?  Thank you Lennis

## 2015-12-26 NOTE — Telephone Encounter (Signed)
Ms Gasaway stated that the letrozole co-pay was $3.00. Cancelled appointments for chemo and injection for 11--20-11-22 as requested by patient.

## 2016-01-01 ENCOUNTER — Encounter: Payer: Self-pay | Admitting: Oncology

## 2016-01-01 ENCOUNTER — Ambulatory Visit: Payer: Medicare Other

## 2016-01-01 ENCOUNTER — Other Ambulatory Visit: Payer: Medicare Other

## 2016-01-01 NOTE — Progress Notes (Signed)
Medical Oncology  Per [facility], no record of bone density scan at either [facility] or [place] per direct phone conversations with both facilities.   Godfrey Pick, MD

## 2016-01-03 ENCOUNTER — Ambulatory Visit: Payer: Medicare Other

## 2016-01-07 ENCOUNTER — Other Ambulatory Visit: Payer: Self-pay | Admitting: Oncology

## 2016-01-07 DIAGNOSIS — C482 Malignant neoplasm of peritoneum, unspecified: Secondary | ICD-10-CM

## 2016-01-15 ENCOUNTER — Encounter: Payer: Self-pay | Admitting: Oncology

## 2016-01-15 ENCOUNTER — Other Ambulatory Visit (HOSPITAL_BASED_OUTPATIENT_CLINIC_OR_DEPARTMENT_OTHER): Payer: Medicare Other

## 2016-01-15 ENCOUNTER — Ambulatory Visit: Payer: Medicare Other

## 2016-01-15 ENCOUNTER — Other Ambulatory Visit: Payer: Self-pay

## 2016-01-15 ENCOUNTER — Ambulatory Visit (HOSPITAL_BASED_OUTPATIENT_CLINIC_OR_DEPARTMENT_OTHER): Payer: Medicare Other | Admitting: Oncology

## 2016-01-15 ENCOUNTER — Telehealth: Payer: Self-pay

## 2016-01-15 VITALS — BP 138/58 | HR 66 | Temp 98.0°F | Resp 18 | Ht 61.5 in | Wt 190.4 lb

## 2016-01-15 DIAGNOSIS — R55 Syncope and collapse: Secondary | ICD-10-CM

## 2016-01-15 DIAGNOSIS — M8580 Other specified disorders of bone density and structure, unspecified site: Secondary | ICD-10-CM | POA: Diagnosis not present

## 2016-01-15 DIAGNOSIS — K7460 Unspecified cirrhosis of liver: Secondary | ICD-10-CM | POA: Diagnosis not present

## 2016-01-15 DIAGNOSIS — Z95828 Presence of other vascular implants and grafts: Secondary | ICD-10-CM

## 2016-01-15 DIAGNOSIS — R161 Splenomegaly, not elsewhere classified: Secondary | ICD-10-CM | POA: Diagnosis not present

## 2016-01-15 DIAGNOSIS — C569 Malignant neoplasm of unspecified ovary: Secondary | ICD-10-CM

## 2016-01-15 DIAGNOSIS — D6959 Other secondary thrombocytopenia: Secondary | ICD-10-CM | POA: Diagnosis not present

## 2016-01-15 DIAGNOSIS — E119 Type 2 diabetes mellitus without complications: Secondary | ICD-10-CM

## 2016-01-15 DIAGNOSIS — G620 Drug-induced polyneuropathy: Secondary | ICD-10-CM | POA: Diagnosis not present

## 2016-01-15 DIAGNOSIS — Z6836 Body mass index (BMI) 36.0-36.9, adult: Secondary | ICD-10-CM | POA: Diagnosis not present

## 2016-01-15 DIAGNOSIS — C482 Malignant neoplasm of peritoneum, unspecified: Secondary | ICD-10-CM

## 2016-01-15 DIAGNOSIS — T451X5A Adverse effect of antineoplastic and immunosuppressive drugs, initial encounter: Secondary | ICD-10-CM

## 2016-01-15 LAB — CBC WITH DIFFERENTIAL/PLATELET
BASO%: 0.2 % (ref 0.0–2.0)
Basophils Absolute: 0 10*3/uL (ref 0.0–0.1)
EOS%: 1.4 % (ref 0.0–7.0)
Eosinophils Absolute: 0.1 10*3/uL (ref 0.0–0.5)
HCT: 37.6 % (ref 34.8–46.6)
HGB: 12.6 g/dL (ref 11.6–15.9)
LYMPH%: 23 % (ref 14.0–49.7)
MCH: 31.5 pg (ref 25.1–34.0)
MCHC: 33.5 g/dL (ref 31.5–36.0)
MCV: 94 fL (ref 79.5–101.0)
MONO#: 0.3 10*3/uL (ref 0.1–0.9)
MONO%: 4.9 % (ref 0.0–14.0)
NEUT#: 3.9 10*3/uL (ref 1.5–6.5)
NEUT%: 70.5 % (ref 38.4–76.8)
Platelets: 119 10*3/uL — ABNORMAL LOW (ref 145–400)
RBC: 4 10*6/uL (ref 3.70–5.45)
RDW: 15 % — ABNORMAL HIGH (ref 11.2–14.5)
WBC: 5.5 10*3/uL (ref 3.9–10.3)
lymph#: 1.3 10*3/uL (ref 0.9–3.3)

## 2016-01-15 LAB — COMPREHENSIVE METABOLIC PANEL
ALT: 110 U/L — ABNORMAL HIGH (ref 0–55)
AST: 69 U/L — ABNORMAL HIGH (ref 5–34)
Albumin: 3.7 g/dL (ref 3.5–5.0)
Alkaline Phosphatase: 122 U/L (ref 40–150)
Anion Gap: 11 mEq/L (ref 3–11)
BUN: 12.3 mg/dL (ref 7.0–26.0)
CO2: 23 mEq/L (ref 22–29)
Calcium: 8 mg/dL — ABNORMAL LOW (ref 8.4–10.4)
Chloride: 108 mEq/L (ref 98–109)
Creatinine: 0.8 mg/dL (ref 0.6–1.1)
EGFR: 78 mL/min/{1.73_m2} — ABNORMAL LOW (ref >90)
Glucose: 167 mg/dl — ABNORMAL HIGH (ref 70–140)
Potassium: 4 mEq/L (ref 3.5–5.1)
Sodium: 141 mEq/L (ref 136–145)
Total Bilirubin: 0.74 mg/dL (ref 0.20–1.20)
Total Protein: 7.3 g/dL (ref 6.4–8.3)

## 2016-01-15 MED ORDER — HEPARIN SOD (PORK) LOCK FLUSH 100 UNIT/ML IV SOLN
500.0000 [IU] | Freq: Once | INTRAVENOUS | Status: AC | PRN
  Administered 2016-01-15: 500 [IU] via INTRAVENOUS
  Filled 2016-01-15: qty 5

## 2016-01-15 MED ORDER — TAMOXIFEN CITRATE 20 MG PO TABS: 20.0000 mg | ORAL_TABLET | Freq: Every day | ORAL | 1 refills | Status: DC

## 2016-01-15 MED ORDER — SODIUM CHLORIDE 0.9 % IJ SOLN
10.0000 mL | INTRAMUSCULAR | Status: DC | PRN
  Administered 2016-01-15: 10 mL via INTRAVENOUS
  Filled 2016-01-15: qty 10

## 2016-01-15 MED ORDER — HYDROCODONE-ACETAMINOPHEN 5-325 MG PO TABS: 1.0000 | ORAL_TABLET | Freq: Four times a day (QID) | ORAL | 0 refills | Status: DC | PRN

## 2016-01-15 NOTE — Progress Notes (Signed)
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 As cytology was ER + at diagnosis, and with multiple other significant comorbid conditions, she is now on hormone blocker rather than trying PARP inhibitor at this point.  She began letrozole after visit 01-01-16.  Patient has had increased hot flashes and may have more aches since starting letrozole, tho significant chronic back pain and not typical arthralgias hands and feet. Major problem continues to be inability to sleep, awake every hour as she was prior to letrozole. She is able to sleep soundly for ~ an hour in recliner in evenings, and continues rotigotine patch for restless legs. Leg cramps resolved, did not need to try sports drinks. She is not checking blood sugars and is not eating correctly, craves sugar and carbs (note very sleep deprived). She has had intermittent nausea and occasional vomiting, which was thought chemo related, but has continued since chemo DCd over a month ago. She denies GERD, uses prn zofran, wonders if this is related to [place]. Bowels are moving. No bleeding. No SOB, no fever, no abdominal or pelvic pain. No problems with PAC Remainder of 10 point Review of Systems negative   PAC flushed 01-15-16 Genetics testing from 02-2014 normal (OvaNext panel) CA 125 1195 at diagnosis. 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 Urine culture by PCP showed no infection and she was referred to Dr Clois Comber, who adjusted medications  with great improvement in the cough. Despite improved cough, she continued with some urinary incontinence. She saw Dr Ronita Hipps in 10-2013, with pelvic mass on exam and CA 125 1195 on 11-08-13. She had CT AP 11-12-13, which showed large ascites, irregular and shrunken liver, mild splenomegaly, increased soft tissue in pelvis bilaterally and anterior mid and lower peritoneum, small left pleural effusion. She saw Dr Denman George in consultation on 11-15-13, who agreed that this was likely gyn malignancy. She had US paracentesis of 4.1 liters on 11-18-13, with cytology ([id number]) and immunohistochemical stains consistent with serous carcinoma of Mullerian tract primary, favor ovarian, ER patchy positive. She had second paracentesis for 3.6 liters on 12-03-13. Gyn oncology recommended initial treatment with chemotherapy. She had first carboplatin and taxol on 12-07-13, with carboplatin dosed at AUC=4 and taxol at 135 mg/m2 due to concern for cirrhosis. She has needed gCSF support and even so was admitted with febrile neutropenia after cycle 3. CA125 was 1938 on 12-01-13 as baseline for chemotherapy, down to 130 prior to cycle 4 and 45 after cycle 5. 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 Chemo resumed 04-19-14, regimen changed to dose dense carbo taxol with granix days 2,3,9,16. She tolerated the dose dense regimen better overall, tho cycle 3 days 8 and 15 were held with neutropenia, symptomatic anemia and fatigue. CT AP 07-04-14 had no obvious recurrent or persistent cancer. She did not go onto GOG 0225 due to [facility] research limitations when she was eligible. CA 125 was 45 in 08-2015 and  47 in 09-2015. CT AP 09-27-15 showed a new small bowel mesenteric nodule 1 cm and a left external iliac node now 1.2 cm compared with 5 mm in 03-2015. Chemo resumed with single agent  carboplatin on 10-10-15, dosed at AUC =3 due to prior problems with cytopenias, with neulasta. She had reaction to [place] skin test prior to cycle 4, such that carboplatin was discontinued. CT AP 12-20-15 showed decrease in size of the few lymph nodes and no progressive or new areas of disease.  She began letrozole early 12-2015, with increased hot flashes. Letrozole changed to tamoxifen  Objective:  Vital signs in last 24 hours:  BP (!) 138/58 (BP Location: Right Arm, Patient Position: Sitting)   Pulse 66   Temp 98 F (36.7 C) (Oral)   Resp 18   Ht 5' 1.5" (1.562 m)   Wt 190 lb 6.4 oz (86.4 kg)   SpO2 97%   BMI 35.39 kg/m   Alert, oriented and appropriate. Ambulatory without assistance difficulty.  Alopecia  HEENT:PERRL, sclerae not icteric. Oral mucosa moist without lesions, posterior pharynx clear.  Neck supple. No JVD.  Lymphatics:no cervical,suraclavicular, axillary or inguinal adenopathy Resp: clear to auscultation bilaterally and normal percussion bilaterally Cardio: regular rate and rhythm. No gallop. GI: soft, nontender, not distended, no mass or organomegaly. Normally active bowel sounds. Surgical incision not remarkable. Musculoskeletal/ Extremities: without pitting edema, cords, tenderness Neuro: no peripheral neuropathy. Otherwise nonfocal Skin without rash, ecchymosis, petechiae Breasts: without dominant mass, skin or nipple findings. Axillae benign. Portacath-without erythema or tenderness  Lab Results:  Results for orders placed or performed in visit on 01/15/16  CBC with Differential  Result Value Ref Range   WBC 5.5 3.9 - 10.3 10e3/uL   NEUT# 3.9 1.5 - 6.5 10e3/uL   HGB 12.6 11.6 - 15.9 g/dL   HCT 37.6 34.8 - 46.6 %   Platelets 119 (L) 145 - 400 10e3/uL   MCV 94.0 79.5 - 101.0 fL   MCH 31.5 25.1 - 34.0 pg   MCHC 33.5 31.5 - 36.0 g/dL   RBC 4.00 3.70 - 5.45 10e6/uL   RDW 15.0 (H) 11.2 - 14.5 %   lymph# 1.3 0.9 - 3.3 10e3/uL   MONO# 0.3 0.1 - 0.9 10e3/uL    Eosinophils Absolute 0.1 0.0 - 0.5 10e3/uL   Basophils Absolute 0.0 0.0 - 0.1 10e3/uL   NEUT% 70.5 38.4 - 76.8 %   LYMPH% 23.0 14.0 - 49.7 %   MONO% 4.9 0.0 - 14.0 %   EOS% 1.4 0.0 - 7.0 %   BASO% 0.2 0.0 - 2.0 %  Comprehensive metabolic panel  Result Value Ref Range   Sodium 141 136 - 145 mEq/L   Potassium 4.0 3.5 - 5.1 mEq/L   Chloride 108 98 - 109 mEq/L   CO2 23 22 - 29 mEq/L   Glucose 167 (H) 70 - 140 mg/dl   BUN 12.3 7.0 - 26.0 mg/dL   Creatinine 0.8 0.6 - 1.1 mg/dL   Total Bilirubin 0.74 0.20 - 1.20 mg/dL   Alkaline Phosphatase 122 40 - 150 U/L   AST 69 (H) 5 - 34 U/L   ALT 110 (H) 0 - 55 U/L   Total Protein 7.3 6.4 - 8.3 g/dL   Albumin 3.7 3.5 - 5.0 g/dL   Calcium 8.0 (L) 8.4 - 10.4 mg/dL   Anion Gap 11 3 - 11 mEq/L   EGFR 78 (L) >90 ml/min/1.73 m2   CA 125 available after visit 31.4, having been 41  on 11-20-15 (last chemo 12-11-15)  Studies/Results:  No results found.  Medications: I have reviewed the patient's current medications. Hold letrozole now. Will try tamoxifen beginning ~ 01-19-16 (see below)  DISCUSSION Interval history reviewed.  Will try tamoxifen instead of letrozole in case side effects are more tolerable, tho tamoxifen may have increased hot flashes compared with letrozole. Will then continue whichever of these 2 is best tolerated.   After visit, I have thought that sleep study might be reasonable, in case sleep apnea etc is exacerbating problems with chronic back pain and restless legs.  We will be in touch with her by phone to discuss.  Patient will hold Januvia and follow blood sugars. If nausea better off of this will let PCP know. Note also on metformin, but did not have nausea with this previously.  Patient is aware that PAC needs to be flushed every 6-8 weeks ongoing.  Assessment/Plan:  1.high grade serous primary peritoneal carcinoma: Neoadjuvant chemotherapy with carboplatin and taxol 12-07-13 thru cycle 5 on 03-08-14. Interval optimal  debulking of what was still large amount of disease, with residual TNTC <7m tumor areas. Additional chemo given x 3 cycles after debulking, thru 44-58-09 complicated by cytopenias and neuropathy.  Rising CA 125 and small volume recurrent disease documented 09-2015. Three cycles of carboplatin thru 11-20-15, then [place] reaction. Partial response by CT 12-20-15, minimal disease by imaging.  Tho by criteria she is eligible for PARP inhibitor, multiple comorbid conditions including cytopenias make side effect profile of estrogen blocker more appealing at this point, as disease was ER positive on previous testing. Letrozole used x 4 weeks beginning early Nov, with hot flashes and possible aches; will see if tamoxifen better tolerated. Note CA 125 lower since last chemo/ letrozole. I will see her back in Jan.  2.hepatic cirrhosis, with mild splenomegaly unchanged on CT 12-20-15/ suggestion of portal HTN: likely NASH. Known to GI 3.Reaction to carboplatin skin test 12-11-15. She would need desensitization if carboplatin required in future.  4.diabetes on metformin + januvia by PCP. Januvia can cause nausea, will try holding for now and follow CBGs, follow up with PCP. 5. 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 No bleeding. 6,post TAH 1988 for benign indications 7.nonSTEMI 2012 unremarkable cath then, HTN, known to Dr NAcie Fredrickson8.previously on estrogen x years, note malignancy is ER +. She has DCd this, hot flashes prior to letrozole but increased with this 9..narrow angle glaucoma: following with ophthalmology. Stress urinary incontinence.  10.Chronic cough resolved with interventions by Dr MClois Comber Pasttobacco 11.restless legs:better with interventions by Dr Tat and since full IV iron repletion. 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 Known to Dr GGladstone Lighter Chiropractic interventions also helping and fine to continue 14.obesity, BMI 36 15.flu vaccine 11-13-15 16.PAC in : flushed with CT 12-20-15. Needs to be flushed every 6-8 weeks when not otherwise used.  17.Skin lesion right pinna in area of previous MOHs. Patient will call dermatologist for follow up. Did not discuss today 172 Osteopenia by bone density scan per patient. [facility] per communication to this MD from [facility] 19. Extremely poor sleep, chronically sleep deprived. Will follow up to see if she has had sleep study done, in case sleep apnea contributing, as she is obese and wakens multiple times nightly  All questions answered and she knows to call prior to next visit if needed. Time spent 25 min including >50% counseling and coordination of care. cc PCP  Evlyn Clines, MD   01/15/2016, 3:00 PM

## 2016-01-15 NOTE — Telephone Encounter (Signed)
-----   Message from Gordy Levan, MD sent at 01/15/2016 11:49 AM EST ----- Regarding: tamoxifen Will try changing letrozole to tamoxifen 20 mg daily #30  1 RF  thanks

## 2016-01-16 ENCOUNTER — Telehealth: Payer: Self-pay

## 2016-01-16 LAB — CA 125: Cancer Antigen (CA) 125: 31.4 U/mL (ref 0.0–38.1)

## 2016-01-16 NOTE — Telephone Encounter (Signed)
-----   Message from Gordy Levan, MD sent at 01/16/2016  7:58 AM EST ----- Labs seen and need follow up: please let patient know marker now 31   thanks

## 2016-01-16 NOTE — Telephone Encounter (Signed)
Told Olivia Benson the results of her CA-125 as noted below by Dr. Marko Plume.

## 2016-01-17 ENCOUNTER — Telehealth: Payer: Self-pay

## 2016-01-17 ENCOUNTER — Other Ambulatory Visit: Payer: Self-pay | Admitting: Oncology

## 2016-01-17 DIAGNOSIS — R55 Syncope and collapse: Secondary | ICD-10-CM | POA: Insufficient documentation

## 2016-01-17 DIAGNOSIS — C482 Malignant neoplasm of peritoneum, unspecified: Secondary | ICD-10-CM

## 2016-01-17 NOTE — Telephone Encounter (Signed)
Ms Bureau has not had a sleep study done. She is agreeable to a referral.  She would like the visit to be in the [place] after the [facility].

## 2016-01-17 NOTE — Telephone Encounter (Signed)
Sent an internal referral to Pulmonary for evaluation for sleep apnea after the first of the year.

## 2016-01-17 NOTE — Telephone Encounter (Signed)
-----   Message from Gordy Levan, MD sent at 01/17/2016  7:07 AM EST ----- Regarding: sleep study If patient has not had sleep study done, I would like to make referral for this to Dr Carlena Sax, in case there is something else he can figure out that would improve it I thought of this after visit, did not discuss with her  Probably just need to ask patient if she has had sleep study done  thanks

## 2016-01-19 ENCOUNTER — Encounter: Payer: Self-pay | Admitting: Neurology

## 2016-01-19 MED ORDER — ROTIGOTINE 1 MG/24HR TD PT24: 1.0000 | MEDICATED_PATCH | Freq: Every day | TRANSDERMAL | 0 refills | Status: DC

## 2016-01-19 NOTE — Telephone Encounter (Signed)
Spoke with [place] appointment scheduler.  Tey can see refferal made and will be in touch with patient.

## 2016-01-29 ENCOUNTER — Other Ambulatory Visit: Payer: Medicare Other

## 2016-01-29 ENCOUNTER — Ambulatory Visit: Payer: Medicare Other | Admitting: Oncology

## 2016-02-13 ENCOUNTER — Other Ambulatory Visit: Payer: Self-pay | Admitting: Oncology

## 2016-02-13 DIAGNOSIS — C482 Malignant neoplasm of peritoneum, unspecified: Secondary | ICD-10-CM

## 2016-02-19 ENCOUNTER — Other Ambulatory Visit (HOSPITAL_BASED_OUTPATIENT_CLINIC_OR_DEPARTMENT_OTHER): Payer: Medicare Other | Admitting: *Deleted

## 2016-02-19 ENCOUNTER — Ambulatory Visit: Payer: Medicare Other

## 2016-02-19 ENCOUNTER — Ambulatory Visit (HOSPITAL_BASED_OUTPATIENT_CLINIC_OR_DEPARTMENT_OTHER): Payer: Medicare Other | Admitting: Oncology

## 2016-02-19 ENCOUNTER — Other Ambulatory Visit (HOSPITAL_BASED_OUTPATIENT_CLINIC_OR_DEPARTMENT_OTHER): Payer: Medicare Other

## 2016-02-19 VITALS — BP 142/56 | HR 64 | Temp 97.9°F | Resp 18 | Ht 61.5 in | Wt 191.6 lb

## 2016-02-19 DIAGNOSIS — R161 Splenomegaly, not elsewhere classified: Secondary | ICD-10-CM | POA: Diagnosis not present

## 2016-02-19 DIAGNOSIS — D500 Iron deficiency anemia secondary to blood loss (chronic): Secondary | ICD-10-CM | POA: Diagnosis not present

## 2016-02-19 DIAGNOSIS — G8929 Other chronic pain: Secondary | ICD-10-CM | POA: Diagnosis not present

## 2016-02-19 DIAGNOSIS — Z95828 Presence of other vascular implants and grafts: Secondary | ICD-10-CM

## 2016-02-19 DIAGNOSIS — G620 Drug-induced polyneuropathy: Secondary | ICD-10-CM

## 2016-02-19 DIAGNOSIS — E119 Type 2 diabetes mellitus without complications: Secondary | ICD-10-CM

## 2016-02-19 DIAGNOSIS — D6959 Other secondary thrombocytopenia: Secondary | ICD-10-CM

## 2016-02-19 DIAGNOSIS — C569 Malignant neoplasm of unspecified ovary: Secondary | ICD-10-CM

## 2016-02-19 DIAGNOSIS — C482 Malignant neoplasm of peritoneum, unspecified: Secondary | ICD-10-CM

## 2016-02-19 DIAGNOSIS — Z79899 Other long term (current) drug therapy: Secondary | ICD-10-CM

## 2016-02-19 DIAGNOSIS — M8580 Other specified disorders of bone density and structure, unspecified site: Secondary | ICD-10-CM | POA: Diagnosis not present

## 2016-02-19 DIAGNOSIS — M545 Low back pain: Secondary | ICD-10-CM

## 2016-02-19 DIAGNOSIS — Z6836 Body mass index (BMI) 36.0-36.9, adult: Secondary | ICD-10-CM | POA: Diagnosis not present

## 2016-02-19 DIAGNOSIS — K7460 Unspecified cirrhosis of liver: Secondary | ICD-10-CM

## 2016-02-19 DIAGNOSIS — R55 Syncope and collapse: Secondary | ICD-10-CM

## 2016-02-19 LAB — CBC WITH DIFFERENTIAL/PLATELET
BASO%: 0.2 % (ref 0.0–2.0)
Basophils Absolute: 0 10*3/uL (ref 0.0–0.1)
EOS%: 2.1 % (ref 0.0–7.0)
Eosinophils Absolute: 0.1 10*3/uL (ref 0.0–0.5)
HCT: 32.7 % — ABNORMAL LOW (ref 34.8–46.6)
HGB: 11 g/dL — ABNORMAL LOW (ref 11.6–15.9)
LYMPH%: 30.4 % (ref 14.0–49.7)
MCH: 31.8 pg (ref 25.1–34.0)
MCHC: 33.6 g/dL (ref 31.5–36.0)
MCV: 94.5 fL (ref 79.5–101.0)
MONO#: 0.2 10*3/uL (ref 0.1–0.9)
MONO%: 3.8 % (ref 0.0–14.0)
NEUT#: 3 10*3/uL (ref 1.5–6.5)
NEUT%: 63.5 % (ref 38.4–76.8)
Platelets: 116 10*3/uL — ABNORMAL LOW (ref 145–400)
RBC: 3.46 10*6/uL — ABNORMAL LOW (ref 3.70–5.45)
RDW: 13.8 % (ref 11.2–14.5)
WBC: 4.8 10*3/uL (ref 3.9–10.3)
lymph#: 1.5 10*3/uL (ref 0.9–3.3)

## 2016-02-19 LAB — COMPREHENSIVE METABOLIC PANEL
ALT: 93 U/L — ABNORMAL HIGH (ref 0–55)
AST: 65 U/L — ABNORMAL HIGH (ref 5–34)
Albumin: 3.7 g/dL (ref 3.5–5.0)
Alkaline Phosphatase: 141 U/L (ref 40–150)
Anion Gap: 11 mEq/L (ref 3–11)
BUN: 14.2 mg/dL (ref 7.0–26.0)
CO2: 22 mEq/L (ref 22–29)
Calcium: 8.5 mg/dL (ref 8.4–10.4)
Chloride: 107 mEq/L (ref 98–109)
Creatinine: 0.8 mg/dL (ref 0.6–1.1)
EGFR: 74 mL/min/{1.73_m2} — ABNORMAL LOW (ref >90)
Glucose: 228 mg/dl — ABNORMAL HIGH (ref 70–140)
Potassium: 3.8 mEq/L (ref 3.5–5.1)
Sodium: 140 mEq/L (ref 136–145)
Total Bilirubin: 0.55 mg/dL (ref 0.20–1.20)
Total Protein: 6.9 g/dL (ref 6.4–8.3)

## 2016-02-19 LAB — IRON AND TIBC
%SAT: 16 % — ABNORMAL LOW (ref 21–57)
Iron: 61 ug/dL (ref 41–142)
TIBC: 385 ug/dL (ref 236–444)
UIBC: 324 ug/dL (ref 120–384)

## 2016-02-19 LAB — FERRITIN: Ferritin: 102 ng/ml (ref 9–269)

## 2016-02-19 MED ORDER — TAMOXIFEN CITRATE 20 MG PO TABS: 20.0000 mg | ORAL_TABLET | Freq: Every day | ORAL | 2 refills | Status: DC

## 2016-02-19 MED ORDER — HEPARIN SOD (PORK) LOCK FLUSH 100 UNIT/ML IV SOLN
500.0000 [IU] | Freq: Once | INTRAVENOUS | Status: AC | PRN
  Administered 2016-02-19: 500 [IU] via INTRAVENOUS
  Filled 2016-02-19: qty 5

## 2016-02-19 MED ORDER — SODIUM CHLORIDE 0.9 % IJ SOLN
10.0000 mL | INTRAMUSCULAR | Status: DC | PRN
  Administered 2016-02-19: 10 mL via INTRAVENOUS
  Filled 2016-02-19: qty 10

## 2016-02-19 NOTE — Progress Notes (Signed)
OFFICE PROGRESS NOTE   February 19, 2016   Physicians: Zack Seal, Bryn Gulling, MD (PCP), Luvenia Starch.Wert, P.Nahser, M.Stark/ J.Mann, R.Tat, R.Gioffre  INTERVAL HISTORY:  Patient is seen, together with husband, in continuing attention to recurrent primary peritoneal carcinoma, which is ER+ and for which she has been on hormone blocker since 01-01-16. Hormonal intervention was chosen in preference to PARP inhibitor due to other medical problems, however she was sensitive to carboplatin x3 thru 11-20-15, so would be appropriate for PARP inhibitor if treatment needs change Last imaging was CT AP 12-20-15.  Patient began hormone blocker with letrozole on 01-01-16, which she did not tolerate well, particularly with hot flashes. Letrozole was changed to tamoxifen 01-19-16, which she has actually tolerated better, tho still "15-20" hot flashes in 24 hrs. Note she had used estrogen x years for hot flashes prior to DC this fall 2017 due to ER + gyn malignancy. Persistent and very bothersome nausea resolved when she held Januvia, then reoccurred on starting that medication back. She has stopped the Januvia again and will let PCP know, blood sugars reportedly not excessive. Appetite is better since nausea resolved, no abdominal or pelvic pain. She had dark stools last week, notes that she takes Advil frequently for chronic back pain and was also off protonix when she noticed the black stools. No other frank bleeding. No increased SOB. Back pain unchanged. Restless legs seem worse again, wonders if she may need IV iron repeated as restless legs were significantly better with previous IV iron. She does not tolerate oral iron well. No LE swelling or pain. No increased SOB. Bladder ok. No fever or symptoms of infection. No problems with PAC Remainder of 14 point Review of Systems negative.   PAC flushed 02-19-16 Genetics testing from 02-2014 normal (OvaNext panel) CA 125 1195 at diagnosis. ER + on cytology  11-18-13 NASH cirrhosis with splenomegaly Flu vaccine 11-13-15  ONCOLOGIC HISTORY Patient had ~ 2 months of some urinary incontinence, saw Dr Ronita Hipps in 10-2013, with pelvic mass on exam and CA 125 1195 on 11-08-13. She had CT AP 11-12-13, which showed large ascites, irregular and shrunken liver, mild splenomegaly, increased soft tissue in pelvis bilaterally and anterior mid and lower peritoneum, small left pleural effusion. She saw Dr Denman George in consultation on 11-15-13, and US paracentesis of 4.1 liters on 11-18-13, with cytology ([id number]) and immunohistochemical stains consistent with serous carcinoma, favor ovarian, ER patchy positive. She had second paracentesis for 3.6 liters on 12-03-13. Gyn oncology recommended initial treatment with chemotherapy. She had first carboplatin and taxol on 12-07-13, with carboplatin dosed at AUC=4 and taxol at 135 mg/m2 due to concern for cirrhosis. She has needed gCSF support and even so was admitted with febrile neutropenia after cycle 3. CA125 was 1938 on 12-01-13 as baseline for chemotherapy, down to 130 prior to cycle 4 and 45 after cycle 5. 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 Chemo resumed 04-19-14, regimen changed to dose dense carbo taxol with granix days 2,3,9,16. She tolerated the dose dense regimen better overall, tho cycle 3 days 8 and 15 were held with neutropenia, symptomatic anemia and fatigue. CT AP 07-04-14 had no obvious recurrent or persistent cancer. She did not go onto GOG 0225 due to [facility] research limitations when she was eligible. CA 125 was 45 in 08-2015 and 47 in 09-2015. CT AP 09-27-15 showed a new  small bowel mesenteric nodule 1 cm and a left external iliac node 1.2 cm compared with 5 mm in 03-2015. Chemo resumed with single agent carboplatin on 10-10-15, dosed at AUC =3 due to prior  problems with cytopenias, with neulasta. She had reaction to [place] skin test prior to cycle 4, such that carboplatin was discontinued. CT AP 12-20-15 showed decrease in size of the few lymph nodes and no progressive or new areas of disease. Due to comorbid problems, decision made to try hormonal blockers in preference to PARP inhibitor then. She began letrozole early 12-2015, with increased hot flashes. Letrozole changed to tamoxifen 01-15-16, tolerated better tho still hot flashes.   Objective:  Vital signs in last 24 hours:  BP (!) 142/56 (BP Location: Right Arm)   Pulse 64   Temp 97.9 F (36.6 C) (Oral)   Resp 18   Ht 5' 1.5" (1.562 m)   Wt 191 lb 9.6 oz (86.9 kg)   SpO2 100%   BMI 35.62 kg/m  Weight up 1 lb Alert, oriented and appropriate, in good spirits, some hot flashes during visit. Ambulatory slowly without assistance and able to get on and off exam table with minimal assistance.    HEENT:PERRL, sclerae not icteric. Oral mucosa moist without lesions, posterior pharynx clear.  Neck supple. No JVD.  Lymphatics:no supraclavicular or inguinal adenopathy Resp: clear to auscultation bilaterally and normal percussion bilaterally Cardio: regular rate and rhythm. No gallop. GI: abdomen obese, soft, nontender, not distended, no mass or organomegaly. Normally active bowel sounds. Surgical incision not remarkable. Musculoskeletal/ Extremities: LE without pitting edema, cords, tenderness PSYCH appropriate mood and affect Skin without rash, ecchymosis, petechiae Portacath-without erythema or tenderness  Lab Results:  Results for orders placed or performed in visit on 02/19/16  CBC with Differential  Result Value Ref Range   WBC 4.8 3.9 - 10.3 10e3/uL   NEUT# 3.0 1.5 - 6.5 10e3/uL   HGB 11.0 (L) 11.6 - 15.9 g/dL   HCT 32.7 (L) 34.8 - 46.6 %   Platelets 116 (L) 145 - 400 10e3/uL   MCV 94.5 79.5 - 101.0 fL   MCH 31.8 25.1 - 34.0 pg   MCHC 33.6 31.5 - 36.0 g/dL   RBC 3.46 (L) 3.70 -  5.45 10e6/uL   RDW 13.8 11.2 - 14.5 %   lymph# 1.5 0.9 - 3.3 10e3/uL   MONO# 0.2 0.1 - 0.9 10e3/uL   Eosinophils Absolute 0.1 0.0 - 0.5 10e3/uL   Basophils Absolute 0.0 0.0 - 0.1 10e3/uL   NEUT% 63.5 38.4 - 76.8 %   LYMPH% 30.4 14.0 - 49.7 %   MONO% 3.8 0.0 - 14.0 %   EOS% 2.1 0.0 - 7.0 %   BASO% 0.2 0.0 - 2.0 %  Comprehensive metabolic panel  Result Value Ref Range   Sodium 140 136 - 145 mEq/L   Potassium 3.8 3.5 - 5.1 mEq/L   Chloride 107 98 - 109 mEq/L   CO2 22 22 - 29 mEq/L   Glucose 228 (H) 70 - 140 mg/dl   BUN 14.2 7.0 - 26.0 mg/dL   Creatinine 0.8 0.6 - 1.1 mg/dL   Total Bilirubin 0.55 0.20 - 1.20 mg/dL   Alkaline Phosphatase 141 40 - 150 U/L   AST 65 (H) 5 - 34 U/L   ALT 93 (H) 0 - 55 U/L   Total Protein 6.9 6.4 - 8.3 g/dL   Albumin 3.7 3.5 - 5.0 g/dL   Calcium 8.5 8.4 - 10.4 mg/dL   Anion  Gap 11 3 - 11 mEq/L   EGFR 74 (L) >90 ml/min/1.73 m2  CA 125 available after visit 36.5, this having been 31.4 on 12-4 and 41 in Oct.  Iron studies available after visit : serum iron 61, %sat 16, ferritin 102  Studies/Results:  No results found.  Medications: I have reviewed the patient's current medications. She needs to eat with every dose of NSAID  DISCUSSION Patient feels that tamoxifen is tolerable and is in agreement with continuing this. CA 125 has been useful marker. Will coordinate follow up visit with Dr Alvy Bimler with PAC flush, tho she can call if needed prior to scheduled appointment. She understands that goal of treatment is to keep disease controlled.  Peripheral IV access is very difficult and she prefers to keep PAC. WIll let her know CA 125 appears stable and that she does not need IV or po iron.   No sleep study scheduled yet in EMR, comment previously "after Jan 1".   She will let PCP know about nausea from [place].  She will keep appointment with Dr Tat in Feb, for restless legs.  Assessment/Plan:  1. High grade serous primary peritoneal carcinoma,  recurrent 16 months after completion of adjuvant chemotherapy. Continue tamoxifen. Follow CA 125, coordinate with PAC flush in ~ 2 months.   Previous treatment: Neoadjuvant chemotherapy with carboplatin and taxol 12-07-13 thru cycle 5 on 03-08-14. Interval optimal debulking of what was still large amount of disease, with residual TNTC <5m tumor areas. Additional chemo given x 3 cycles after debulking, thru 49-51-88 complicated by cytopenias and neuropathy. Rising CA 125 and small volume recurrent disease documented 09-2015. Three cycles of carboplatin thru 11-20-15, then [place] test reaction. Partial response by CT 12-20-15, minimal disease by imaging. Tho by criteria she is eligible for PARP inhibitor, multiple comorbid conditions including cytopenias make side effect profile of estrogen blocker more appealing at this point, as disease was ER positive on previous testing. .     2.hepatic cirrhosis thought from NASH, with mild splenomegaly unchanged on CT 12-20-15/ suggestion of portal HTN. Known to GI 3.Reaction to carboplatin skin test 12-11-15. She would need desensitization if carboplatin required in future.  4.diabetes on metformin. Resolution of nausea since she has held [place] Follow up with PCP 5. 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 No bleeding. 6,post TAH 1988 for benign indications 7.nonSTEMI 2012 unremarkable cath then, HTN, known to Dr NAcie Fredrickson8.previously on estrogen x years, note malignancy is ER +. DCd  estrogen 10-2015  9..narrow angle glaucoma: following with ophthalmology.   10.Chronic cough resolved with interventions by Dr MClois Comber Pasttobacco 11.restless legs:better with interventions by Dr Tat and since full IV iron repletion. 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 Known to  Dr GGladstone Lighter Chiropractic interventions also helping and fine to continue 14.obesity, BMI 36 15.flu vaccine 11-13-15 16.PAC in : Needs to be flushed every 6-8 weeks when not otherwise used.  17.Skin lesion right pinna [facility] for follow up. Did not discuss today 165 Osteopenia by bone density scan per patient. [facility] not find report [facility] per communication to this MD from HIM 19. Extremely poor sleep, chronically sleep deprived. I suggested sleep study in case sleep apnea contributing, as she is obese and wakens multiple times nightly, not set up yet  All questions  answered and she is in agreement with recommendations and plans. Time spent 25 min including >50% counseling and coordination of care. Cc Dr Lavone Neri, Dr Leone Payor, MD   02/19/2016, 9:05 AM

## 2016-02-19 NOTE — Patient Instructions (Signed)

## 2016-02-20 ENCOUNTER — Telehealth: Payer: Self-pay

## 2016-02-20 LAB — CA 125: Cancer Antigen (CA) 125: 36.5 U/mL (ref 0.0–38.1)

## 2016-02-20 NOTE — Telephone Encounter (Signed)
[name], MD  Baruch Merl, RN Cc: Janace Hoard, RN        Labs seen and need follow up: please let her know iron is not low on these labs, does not need IV iron and does not need to increase oral iron, as she does not tolerate that     Associated Results

## 2016-02-20 NOTE — Telephone Encounter (Signed)
Spoke with Ms Fink and told her the results of the CA-125 and iron levels and noted below by Dr. Marko Plume.

## 2016-02-20 NOTE — Telephone Encounter (Signed)
-----   Message from Gordy Levan, MD sent at 02/20/2016  8:35 AM EST ----- Labs seen and need follow up: please let her know marker no significant change, at 36. Continue tamoxifen as planned

## 2016-03-03 ENCOUNTER — Encounter: Payer: Self-pay | Admitting: Oncology

## 2016-03-19 ENCOUNTER — Other Ambulatory Visit: Payer: Self-pay | Admitting: Neurology

## 2016-03-25 ENCOUNTER — Telehealth: Payer: Self-pay | Admitting: *Deleted

## 2016-03-25 NOTE — Telephone Encounter (Signed)
Patient called and stated,"I have leg cramps at night, my legs are sore, I can't walk from the front door to the back door, my restless leg is back. What can I do?" Per Dr. Alvy Bimler, have the patient stop Tamoxifen until she sees me 04/15/16. Instructed the patient to continue taking her Ibuprofen around the clock for pain/sore legs. Patient is not to take Tylenol due to liver function. Patient verbalized understanding of conversation.

## 2016-03-29 NOTE — Progress Notes (Signed)
Olivia Benson was seen today in neurologic consultation at the request of Dr. Marko Plume. Her PCP is Helane Rima, MD.  The consultation is for RLS.  Prior records were reviewed.  The patient also has a history of primary peritoneal carcinomatosis, grade 3.  She has undergone previous debulking surgery and chemotherapy with carboplatin and Taxol, which was last completed on 05/30/2014.  Her chemotherapy has been complicated by neutropenia, peripheral neuropathy, symptomatically anemia and fatigue.  She presents today to discuss restless leg syndrome.  Pt states that her sx's consist of a crawling sensation on the legs; she feels that she has a spider web on the leg; "I just have to move."  It may have started pre-chemo but got much worse after chemo.  It starts mid afternoon once she sits and "gets quiet" and tries to watch TV.    She has previously tried flexeril, baclofen, ultram, restoril and oxycontin.  The only one that has been helpful is oxyIR.  She has never been on requip, mirapex, levodopa, klonopin.    She admits that she craves ice.  She has been anemic.  Her last Hgb was 10.6.    11/25/14 update:  The patient is following up today in regards to her restless leg syndrome that is likely secondary to iron deficiency anemia.  Last visit, I started her on pramipexole, 0.125 mg after dinner and 0.25 mg at night. When she wakes up at night, she has "bone pain" but no RLS sx's.  She is still on one oxycodone at night for the pain but has dropped that from 2 tablets.   I was able to get a copy of her TSH from January, 2015 and it was normal at 1.820.  Dr. Marko Plume did start her on oral iron after our last visit but unfortunately the patient did not tolerate that because of nausea and it was recommended that the patient try instead a multivitamin with iron and she couldn't tolerate that either.  Said that she actually tolerated oral iron better than MVI.  Said that her ice craving went away.  05/24/15  update:  The patient follows up today in regards to restless leg syndrome.  She is on pramipexole, 0.125 mg after dinner and 0.25 mg at night.  She has been worse but isn't sure if that is because she is no longer on her oxycontin.  Admits she isn't taking her iron supplements.   She isn't sleeping at all now.  Eating ice again.  She did get married since our last visit, but reports that she has been with the same gentleman for a long time.  States that her weight and BS have both been going up and she is frustrated with that.  She has had some arthritic pain and the indocin helps but she had some stomach pain.  07/28/15 update:  The patient is seen today in follow-up for restless leg syndrome, which has been secondary to iron deficiency.  Last visit, she was complaining about worsening symptoms and increased her pramipexole 0.5 mg, so that she was taking a half a tablet after dinner and one tablet at night.  She complains that this medication makes her cough and causes SOB so she only takes 1/2 after dinner and 1/2 at night. Last visit, I  also checked her iron, and the percent saturation was low at 7% and total iron was low at 34.  I told her that she absolutely needed to restart her iron supplements.  She  states that she is taking this and "it isn't helping and my cravings for ice is increasing."  Admits that she overall isn't feeling good.  "I have pain all over."  No taking diabetes meds as prescribed.    11/27/15 update:  Pt f/u today for RLS.  She thought that mirapex caused SOB and cough and although that would be very unusual, we d/c that last visit and changed it to neupro patch.  I thought that her iron was likely low last visit and I rechecked it, and it indeed was low.  Through Dr. Marko Plume, IV iron was arranged for the patient.  She felt that she did amazing after the 2nd infusion of iron.  She is now off of the iron.  She is also off of the patch because she is in the donut hole.  She quit the IV  iron because she started chemo.  She is starting to crave ice again but not like she was last visit and not SOB like she was before last visit. The records that were made available to me were reviewed.  Unfortunately, pt had recurrence of her primary peritoneal carcinomatosis since our last visit.  Her chemo was restarted.  04/02/16 update: Patient seen today in follow-up.  She was given samples of the Neupro patch last visit, 1 mg for her restless leg.  She had repeat ferritin and iron studies on 02/19/2016 and were unremarkable.  States that she really was doing well until started on tamoxifen and then the RLS started acting up.  She is having cramping of the legs as well and has been taking a significant amount of advil (8-12 per day).   PREVIOUS MEDICATIONS: n/a  ALLERGIES:   Allergies  Allergen Reactions  . Shellfish Allergy Anaphylaxis    HER THROAT SWELLS UP AND CLOSES  . Benadryl [Diphenhydramine Hcl] Other (See Comments)    Restless legs   . Penicillins Rash    WHEN SHE WAS TEENAGER  . Tape Itching    Red itch with some tapes    CURRENT MEDICATIONS:  Outpatient Encounter Prescriptions as of 04/02/2016  Medication Sig  . amLODipine (NORVASC) 2.5 MG tablet Take 2.5 mg by mouth daily.  . bisoprolol (ZEBETA) 5 MG tablet   . HYDROcodone-acetaminophen (NORCO/VICODIN) 5-325 MG tablet Take 1 tablet by mouth every 6 (six) hours as needed for moderate pain.  Marland Kitchen lidocaine-prilocaine (EMLA) cream Apply to Porta-cath  1-2 hours prior to access as directed.  . loratadine (CLARITIN) 10 MG tablet Take 10 mg by mouth daily as needed for allergies.  . metFORMIN (GLUCOPHAGE-XR) 500 MG 24 hr tablet Take 1,000 mg by mouth 2 (two) times daily.   Marland Kitchen NEUPRO 1 MG/24HR PT24 APPLY 1 PATCH TO THE SKIN DAILY  . ondansetron (ZOFRAN) 8 MG tablet Take 1 tablet (8 mg total) by mouth every 8 (eight) hours as needed for nausea or vomiting.  . pantoprazole (PROTONIX) 40 MG tablet Take 40 mg by mouth daily.   .  Rotigotine (NEUPRO) 1 MG/24HR PT24 Place 1 patch (1 mg total) onto the skin daily.  . tamoxifen (NOLVADEX) 20 MG tablet Take 1 tablet (20 mg total) by mouth daily.   No facility-administered encounter medications on file as of 04/02/2016.     PAST MEDICAL HISTORY:   Past Medical History:  Diagnosis Date  . Asthma   . Cirrhosis ([place])   . Complication of anesthesia    slow to wake up / n & v  . Diabetes mellitus   .  Elevated LFTs   . Family history of adverse reaction to anesthesia    multiple family members have had problems such as slow to wake / "flat lined" /  ventilator    . Frequency of urination   . Glaucoma, narrow-angle   . History of skin cancer   . Hypertension   . Non-ST elevated myocardial infarction (non-STEMI) [facility]) Sept 2012   Mild elevation in troponin and CK MB but no evidence of coronary disease at time of cath  . Obesity   . Ovarian cancer ([place]) 11/22/2013   Disseminated ovarian cancer  . Overactive bladder   . PONV (postoperative nausea and vomiting)   . Portal hypertension ([place])   . Restless leg syndrome   . Tingling    hands and feet    PAST SURGICAL HISTORY:   Past Surgical History:  Procedure Laterality Date  . ABDOMINAL ULTRASOUND  Sept 2012   No gallbladder disease, + fatty liver  . CARDIAC CATHETERIZATION  10/25/2010   EF 60%; Normal coronaries  . COLONOSCOPY  10/18/2005  . COSMETIC SURGERY  2003  . DENTAL SURGERY    . EYE SURGERY Bilateral 09/19/14 R eye, 08/2014 L eye   open angles in both eyes d/t narrow angle glaucoma  . LAPAROTOMY N/A 03/29/2014   Procedure: EXPLORATORY LAPAROTOMY;  Surgeon: Everitt Amber, MD;  Location: [facility];  Service: Gynecology;  Laterality: N/A;  . PARTIAL HYSTERECTOMY  1988  . SALPINGOOPHORECTOMY Bilateral 03/29/2014   Procedure: BILATERAL SALPINGO OOPHORECTOMY, OMENTECTOMY, DEBULKING;  Surgeon: Everitt Amber, MD;  Location: [facility];  Service: Gynecology;  Laterality: Bilateral;  . TONSILLECTOMY  1971  . TUBAL LIGATION    .  [facility]  11/24/2009    SOCIAL HISTORY:   Social History   Social History  . Marital status: Single    Spouse name: N/A  . Number of children: 1 D  . Years of education: N/A   Occupational History  . Retired    Social History Main Topics  . Smoking status: Former Smoker    Packs/day: 1.00    Years: 1.00    Types: Cigarettes    Quit date: 02/11/1982  . Smokeless tobacco: Never Used  . Alcohol use No  . Drug use: No  . Sexual activity: Not on file   Other Topics Concern  . Not on file   Social History Narrative  . No narrative on file    FAMILY HISTORY:   Family Status  Relation Status  . Mother Alive   arthritis, htn, DM  . Father Deceased at age 30   bladder cancer, stroke  . Sister Alive   breast cancer  . Sister Alive   htn  . Sister Alive   htn  . Sister Alive   htn  . Maternal Aunt Alive  . Cousin Alive  . Maternal Uncle Deceased at age infancy  . Maternal Grandmother Deceased at age 3   pneumonia  . Maternal Grandfather Deceased  . Paternal Grandmother Deceased  . Paternal Grandfather Deceased  . Maternal Aunt Alive  . Maternal Aunt Alive  . Maternal Aunt Alive  . Son Deceased at age 11   died of brain anerysm, after a 20 ft fall  . Brother Alive   htn    ROS:  A complete 10 system review of systems was obtained and was unremarkable apart from what is mentioned above.  PHYSICAL EXAMINATION:    VITALS:   There were no vitals filed for this visit.  GEN:  Normal appears female in no acute distress.  Appears stated age. HEENT:  Normocephalic, atraumatic. The mucous membranes are moist. The superficial temporal arteries are without ropiness or tenderness. Cardiovascular: Regular rate and rhythm. Lungs: Clear to auscultation bilaterally. Neck/Heme: There are no carotid bruits noted bilaterally.  NEUROLOGICAL: Orientation:  The patient is alert and oriented x 3.   Cranial nerves: There is good facial symmetry.  Speech is fluent and  clear. Soft palate rises symmetrically and there is no tongue deviation. Hearing is intact to conversational tone. Tone: Tone is good throughout. Sensation: Sensation is intact to light touch throughout Coordination:  The patient has no difficulty with RAM's or FNF bilaterally. Motor: Strength is 5/5 in the bilateral upper and and at least 5-/5 with give way weakness in the LE.  Shoulder shrug is equal and symmetric. There is no pronator drift.  There are no fasciculations noted. Gait and Station: The patient is able to ambulate without difficulty.   Lab Results  Component Value Date   WBC 4.8 02/19/2016   HGB 11.0 (L) 02/19/2016   HCT 32.7 (L) 02/19/2016   MCV 94.5 02/19/2016   PLT 116 (L) 02/19/2016   Lab Results  Component Value Date   TSH 1.119 10/13/2014   Lab Results  Component Value Date   IRON 61 02/19/2016   TIBC 385 02/19/2016   FERRITIN 102 02/19/2016     Chemistry      Component Value Date/Time   NA 140 02/19/2016 0756   K 3.8 02/19/2016 0756   CL 105 03/30/2014 0618   CO2 22 02/19/2016 0756   BUN 14.2 02/19/2016 0756   CREATININE 0.8 02/19/2016 0756      Component Value Date/Time   CALCIUM 8.5 02/19/2016 0756   ALKPHOS 141 02/19/2016 0756   AST 65 (H) 02/19/2016 0756   ALT 93 (H) 02/19/2016 0756   BILITOT 0.55 02/19/2016 0756        IMPRESSION/PLAN  1. RLS, secondary to iron def anemia  -on oral iron.  Iron studies just rechecked and were normal.  -neupro patch increase from 1 mg to 2 mg.  Risks, benefits, side effects and alternative therapies were discussed.  The opportunity to ask questions was given and they were answered to the best of my ability.  The patient expressed understanding and willingness to follow the outlined treatment protocols.  2.  LE cramping  -taking 8-12 ibuprofen per day and told her that she could not do that and talked about consequences of that  -add tonic water at night  3.  F/u 4 months, sooner should new issues arise.   Much greater than 50% of this visit was spent in counseling and coordinating care.  Total face to face time:  25 min

## 2016-04-02 ENCOUNTER — Ambulatory Visit (INDEPENDENT_AMBULATORY_CARE_PROVIDER_SITE_OTHER): Payer: Medicare Other | Admitting: Neurology

## 2016-04-02 ENCOUNTER — Encounter: Payer: Self-pay | Admitting: Neurology

## 2016-04-02 VITALS — BP 120/78 | HR 66 | Ht 62.0 in | Wt 194.0 lb

## 2016-04-02 DIAGNOSIS — G2581 Restless legs syndrome: Secondary | ICD-10-CM | POA: Diagnosis not present

## 2016-04-02 DIAGNOSIS — R252 Cramp and spasm: Secondary | ICD-10-CM

## 2016-04-02 MED ORDER — ROTIGOTINE 2 MG/24HR TD PT24: 1.0000 | MEDICATED_PATCH | Freq: Every day | TRANSDERMAL | 0 refills | Status: DC

## 2016-04-02 MED ORDER — ROTIGOTINE 2 MG/24HR TD PT24: 1.0000 | MEDICATED_PATCH | Freq: Every day | TRANSDERMAL | 3 refills | Status: DC

## 2016-04-02 NOTE — Patient Instructions (Signed)
1. You can use 2 oz of tonic water at bedtime to help with restless leg symptoms.   2. You need to lower daily advil intake.   3. Start Neupro 2 mg daily. We have given you samples and sent a prescription to your pharmacy.

## 2016-04-15 ENCOUNTER — Telehealth: Payer: Self-pay | Admitting: Hematology and Oncology

## 2016-04-15 ENCOUNTER — Ambulatory Visit: Payer: Medicare Other

## 2016-04-15 ENCOUNTER — Other Ambulatory Visit (HOSPITAL_BASED_OUTPATIENT_CLINIC_OR_DEPARTMENT_OTHER): Payer: Medicare Other

## 2016-04-15 ENCOUNTER — Ambulatory Visit (HOSPITAL_BASED_OUTPATIENT_CLINIC_OR_DEPARTMENT_OTHER): Payer: Medicare Other | Admitting: Hematology and Oncology

## 2016-04-15 VITALS — BP 151/73 | HR 65 | Temp 98.4°F | Resp 18 | Wt 192.6 lb

## 2016-04-15 DIAGNOSIS — N951 Menopausal and female climacteric states: Secondary | ICD-10-CM | POA: Diagnosis not present

## 2016-04-15 DIAGNOSIS — C482 Malignant neoplasm of peritoneum, unspecified: Secondary | ICD-10-CM

## 2016-04-15 DIAGNOSIS — I10 Essential (primary) hypertension: Secondary | ICD-10-CM

## 2016-04-15 DIAGNOSIS — K7460 Unspecified cirrhosis of liver: Secondary | ICD-10-CM

## 2016-04-15 DIAGNOSIS — G620 Drug-induced polyneuropathy: Secondary | ICD-10-CM

## 2016-04-15 DIAGNOSIS — R232 Flushing: Secondary | ICD-10-CM | POA: Insufficient documentation

## 2016-04-15 DIAGNOSIS — K7581 Nonalcoholic steatohepatitis (NASH): Secondary | ICD-10-CM

## 2016-04-15 DIAGNOSIS — R161 Splenomegaly, not elsewhere classified: Secondary | ICD-10-CM | POA: Diagnosis not present

## 2016-04-15 DIAGNOSIS — G2581 Restless legs syndrome: Secondary | ICD-10-CM | POA: Diagnosis not present

## 2016-04-15 DIAGNOSIS — T451X5A Adverse effect of antineoplastic and immunosuppressive drugs, initial encounter: Secondary | ICD-10-CM

## 2016-04-15 DIAGNOSIS — D696 Thrombocytopenia, unspecified: Secondary | ICD-10-CM

## 2016-04-15 DIAGNOSIS — E119 Type 2 diabetes mellitus without complications: Secondary | ICD-10-CM

## 2016-04-15 DIAGNOSIS — Z95828 Presence of other vascular implants and grafts: Secondary | ICD-10-CM

## 2016-04-15 LAB — CBC WITH DIFFERENTIAL/PLATELET
BASO%: 0.2 % (ref 0.0–2.0)
Basophils Absolute: 0 10*3/uL (ref 0.0–0.1)
EOS%: 3.2 % (ref 0.0–7.0)
Eosinophils Absolute: 0.2 10*3/uL (ref 0.0–0.5)
HCT: 36.4 % (ref 34.8–46.6)
HGB: 11.8 g/dL (ref 11.6–15.9)
LYMPH%: 29.8 % (ref 14.0–49.7)
MCH: 29.4 pg (ref 25.1–34.0)
MCHC: 32.4 g/dL (ref 31.5–36.0)
MCV: 90.8 fL (ref 79.5–101.0)
MONO#: 0.2 10*3/uL (ref 0.1–0.9)
MONO%: 4.7 % (ref 0.0–14.0)
NEUT#: 3.2 10*3/uL (ref 1.5–6.5)
NEUT%: 62.1 % (ref 38.4–76.8)
Platelets: 119 10*3/uL — ABNORMAL LOW (ref 145–400)
RBC: 4.01 10*6/uL (ref 3.70–5.45)
RDW: 14.1 % (ref 11.2–14.5)
WBC: 5.1 10*3/uL (ref 3.9–10.3)
lymph#: 1.5 10*3/uL (ref 0.9–3.3)

## 2016-04-15 LAB — COMPREHENSIVE METABOLIC PANEL
ALT: 88 U/L — ABNORMAL HIGH (ref 0–55)
AST: 58 U/L — ABNORMAL HIGH (ref 5–34)
Albumin: 3.7 g/dL (ref 3.5–5.0)
Alkaline Phosphatase: 143 U/L (ref 40–150)
Anion Gap: 10 mEq/L (ref 3–11)
BUN: 16.3 mg/dL (ref 7.0–26.0)
CO2: 22 mEq/L (ref 22–29)
Calcium: 8.6 mg/dL (ref 8.4–10.4)
Chloride: 108 mEq/L (ref 98–109)
Creatinine: 0.8 mg/dL (ref 0.6–1.1)
EGFR: 73 mL/min/{1.73_m2} — ABNORMAL LOW (ref >90)
Glucose: 194 mg/dl — ABNORMAL HIGH (ref 70–140)
Potassium: 4.1 mEq/L (ref 3.5–5.1)
Sodium: 141 mEq/L (ref 136–145)
Total Bilirubin: 0.52 mg/dL (ref 0.20–1.20)
Total Protein: 7.2 g/dL (ref 6.4–8.3)

## 2016-04-15 MED ORDER — SODIUM CHLORIDE 0.9 % IJ SOLN
10.0000 mL | INTRAMUSCULAR | Status: DC | PRN
  Administered 2016-04-15: 10 mL via INTRAVENOUS
  Filled 2016-04-15: qty 10

## 2016-04-15 MED ORDER — HEPARIN SOD (PORK) LOCK FLUSH 100 UNIT/ML IV SOLN
500.0000 [IU] | Freq: Once | INTRAVENOUS | Status: AC | PRN
  Administered 2016-04-15: 500 [IU] via INTRAVENOUS
  Filled 2016-04-15: qty 5

## 2016-04-15 MED ORDER — GABAPENTIN 300 MG PO CAPS: 300.0000 mg | ORAL_CAPSULE | Freq: Three times a day (TID) | ORAL | 1 refills | Status: DC

## 2016-04-15 MED ORDER — HYDROCODONE-ACETAMINOPHEN 5-325 MG PO TABS: ORAL_TABLET | ORAL | 0 refills | Status: DC

## 2016-04-15 NOTE — Telephone Encounter (Signed)
Appointments scheduled per 3/5 LOS. Patient given AVS report and calendars with future schedule appointments. Patient will take water based contrast for her CT scan appointment.

## 2016-04-15 NOTE — Assessment & Plan Note (Signed)
The patient tolerated tamoxifen better than letrozole Her last imaging study was several months ago. I plan to repeat imaging study when I see her back next month We also monitoring tumor markers. Her tumor marker fluctuated recently but overall not significantly elevated. Tumor markers from today is pending.

## 2016-04-15 NOTE — Assessment & Plan Note (Signed)
This is due to liver cirrhosis and splenomegaly. She is not symptomatic. Recommend observation only.

## 2016-04-15 NOTE — Assessment & Plan Note (Signed)
Her liver cirrhosis has caused abnormal liver function, splenomegaly and thrombocytopenia.  Continue to monitor carefully

## 2016-04-15 NOTE — Assessment & Plan Note (Signed)
she will continue current medical management. I recommend close follow-up with primary care doctor for medication adjustment. She has multiple cardiac risk factors. She is on tamoxifen which will increase her risk of blood clots. I recommend she resume aspirin therapy. There is no contraindication to remain on antiplatelet agents or anticoagulants as long as the platelet is greater than 50,000.

## 2016-04-15 NOTE — Assessment & Plan Note (Signed)
She follows with neurologist for this. She has received intravenous iron infusion in the past. Her last ferritin level was adequate. Continue conservative management

## 2016-04-15 NOTE — Assessment & Plan Note (Signed)
She has significant hot flashes. We discussed treatment options. Most SSRI will interact with her medications. As above, I recommend a trial of gabapentin.  Previous small studies have suggested some benefit in reducing frequency of hot flashes so I think is reasonable to try

## 2016-04-15 NOTE — Assessment & Plan Note (Signed)
She has leg pain, neuropathy, bone pain, restless legs and other symptom of musculoskeletal discomfort. I recommend vitamin D and calcium supplements. I think it is reasonable to give her a trial of gabapentin. I did warn her about risk of constipation and sedation with gabapentin.

## 2016-04-15 NOTE — Progress Notes (Addendum)
[facility] progress notes  Patient Care Team: Helane Rima, MD as PCP - General (Family Medicine)  CHIEF COMPLAINTS/PURPOSE OF VISIT:  Recurrent primary peritoneal carcinoma  HISTORY OF PRESENTING ILLNESS:  Olivia Benson 71 y.o. female was transferred to my care after her prior physician has left.  Her history is very complex and has been significant amount of time reviewing her records I reviewed the patient's records extensive and collaborated the history with the patient. Summary of her history is as follows:   Primary peritoneal carcinomatosis ([place])   09/06/2011 Imaging    CT findings consistent with cirrhotic changes involving the liver.  No worrisome liver mass.  There are associated portal venous collaterals and splenomegaly consistent with portal venous hypertension. 2.  No other significant upper abdominal findings      11/08/2013 Imaging    US showed ascites      11/08/2013 Initial Diagnosis    Patient had ~ 2 months of some urinary incontinence, saw Dr Ronita Hipps in 10-2013, with pelvic mass on exam       11/12/2013 Imaging    There are findings of progressive hepatic cirrhosis with a large amount of ascites and mild splenomegaly. There are venous collaterals present. 2. There is abnormal thickening of the peritoneal surface along the left mid and lower abdominal wall. There is abnormal soft tissue density in the pelvis as well which could reflect the clinically suspected ovarian malignancy but the findings are nondiagnostic. 3. There is a new small left pleural effusion. 4. There is no acute bowel abnormality.      11/18/2013 Imaging    Successful ultrasound guided diagnostic and therapeutic paracentesis yielding 4.1 liters of ascites.       11/18/2013 Pathology Results    PERITONEAL/ASCITIC FLUID (SPECIMEN 1 OF 1 COLLECTED 11/18/13): SEROUS CARCINOMA, PLEASE SEE COMMENT.      12/01/2013 Tumor Marker    Patient's tumor was tested for the  following markers: CA125 Results of the tumor marker test revealed 1938      12/03/2013 Imaging    Successful ultrasound-guided therapeutic paracentesis yielding 3.6 liters of peritoneal fluid.      12/07/2013 - 05/30/2014 Chemotherapy    She received 3 cycles of carboplatin and Taxol, interrupted cycle 4 for surgery.  She subsequently completed 3 more cycles of chemotherapy after surgery      12/20/2013 Imaging    Successful ultrasound-guided diagnostic and therapeutic paracentesis yielding 2.2 liters of peritoneal fluid      12/20/2013 Pathology Results    PERITONEAL/ASCITIC FLUID(SPECIMEN 1 OF 1 COLLECTED 12/20/13): MALIGNANT CELLS CONSISTENT WITH SEROUS CARCINOMA      01/13/2014 Tumor Marker    Patient's tumor was tested for the following markers: CA125 Results of the tumor marker test revealed 227      02/07/2014 Tumor Marker    Patient's tumor was tested for the following markers: CA125 Results of the tumor marker test revealed 130      02/15/2014 Genetic Testing    Genetics testing from 02-2014 normal ([employer] panel)      02/23/2014 Imaging    Interval decrease in ascites. 2. Morphologic changes in the liver consistent with cirrhosis. 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 Portal vein remains patent at this time. 3. Persistent but decreased abnormal soft tissue attenuation tracking in the omental fat. This may be secondary to interval improvement in metastatic disease. 4. Peritoneal thickening seen in the left abdomen and pelvis on the previous study has decreased  and nearly resolved in the interval. This also suggests interval improvement in metastatic disease.       03/07/2014 Procedure    Ultrasound and fluoroscopically guided right internal jugular single lumen power port catheter insertion. Tip in the SVC/RA junction. Catheter ready for use.      03/17/2014 Tumor Marker    Patient's tumor was tested for the following markers:  CA125 Results of the tumor marker test revealed 45      03/29/2014 Pathology Results    1. Omentum, resection for tumor - HIGH GRADE SEROUS CARCINOMA, SEE COMMENT. 2. Ovary and fallopian tube, right - HIGH GRADE SEROUS CARCINOMA, SEE COMMENT. 3. Ovary and fallopian tube, left - HIGH GRADE SEROUS CARCINOMA, SEE COMMENT. Diagnosis Note 1. Nests and clusters of malignant cells are invading the omental tissue (part #1) with associated fibrosis. The cells are pleomorphic with prominent nucleoli. There are scattered psammoma bodies. The ovaries are atrophic and exhibit multiple foci of surface based invasive carcinoma and associated fibrosis. The fallopian tubes have a few foci of carcinoma, also superficially located. There are no precursor lesions noted in the ovaries or fallopian tubes (the entire tubes and ovaries were submitted for evaluation). While there is some retraction artifact, there are several foci suspicious for lymphovascular invasion. 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 Given the fibrosis, there does appear to be a small amount of treatment effect, however, there is abundant residual tumor.       03/29/2014 Surgery    Procedure(s) Performed: 1. Exploratory laparotomy with bilateral salpingo-oophorectomy, omentectomy radical tumor debulking for ovarian cancer .  Surgeon: Thereasa Solo, MD.  Assistant Surgeon: Lahoma Crocker, M.D. Assistant: (an MD assistant was necessary for tissue manipulation, retraction and positioning due to the complexity of the case and hospital policies).  Operative Findings: 10cm omental cake from hepatic to splenic flexure, densely adherent to transverse colon. Milliary studding of tumor implants (<84m, too numerous in number to count) adherent to the mesentery of the small bowel and small bowel wall. Small volume  (100cc) ascites. Small ovaries bilaterally, densely adherent to the pelvic cul de sac. Left ovary and tube densely adherent to sigmoid colon. Cirrhotic liver with hepatomegaly and splenomegaly.     This represented an optimal cytoreduction (R1) with visible disease residual on the bowel wall and mesentery (millial, <360mimplants).        04/18/2014 Tumor Marker    Patient's tumor was tested for the following markers: CA125 Results of the tumor marker test revealed 122      05/16/2014 Tumor Marker    Patient's tumor was tested for the following markers: CA125 Results of the tumor marker test revealed 30      06/10/2014 Tumor Marker    Patient's tumor was tested for the following markers: CA125 Results of the tumor marker test revealed 19      07/04/2014 Imaging    Interval improvement in the appearance of peritoneal metastasis secondary to ovarian cancer. 2. 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       08/18/2014 Tumor Marker    Patient's tumor was tested for the following markers: CA125 Results of the tumor marker test revealed 16      10/13/2014 Tumor Marker    Patient's tumor was tested for the following markers: CA125 Results of the tumor marker test revealed 14  11/21/2014 Tumor Marker    Patient's tumor was tested for the following markers: CA125 Results of the tumor marker test revealed 16      12/28/2014 Tumor Marker    Patient's tumor was tested for the following markers: CA125 Results of the tumor marker test revealed 762      02/27/2015 Tumor Marker    Patient's tumor was tested for the following markers: CA125 Results of the tumor marker test revealed 20.2      03/22/2015 Imaging    Filler appearance to the prior exam, with very subtle fluid tracking along portions of the liver, and very minimal nodularity along the right paracolic gutter representing residua from the prior peritoneal cancer. The current abnormalities could simply be post  therapy findings rather than necessarily representing residual malignancy. No new or enlarging lesions are identified. 2. Hepatic cirrhosis and splenomegaly. There is some gastric varices suggesting portal venous hypertension. 3. Left foraminal stenosis at L4-5 due to spurring. There is likely also some central narrowing of the thecal sac at this level. 4. Bibasilar scarring. 5. Chronic mild left mid kidney scarring      03/27/2015 Tumor Marker    Patient's tumor was tested for the following markers: CA125 Results of the tumor marker test revealed 25.3      04/24/2015 Imaging    Disc bulge L2-3 with mild spinal stenosis and right foraminal encroachment. 2. Disc bulge L3-4 with mild spinal stenosis. 3. Multifactorial spinal, left lateral recess and foraminal stenosis L4-5. There is grade 1 anterolisthesis without evident dynamic instability.      05/29/2015 Tumor Marker    Patient's tumor was tested for the following markers: CA125 Results of the tumor marker test revealed 29.9      08/21/2015 Tumor Marker    Patient's tumor was tested for the following markers: CA125 Results of the tumor marker test revealed 45      09/18/2015 Tumor Marker    Patient's tumor was tested for the following markers: CA125 Results of the tumor marker test revealed 47      09/27/2015 Imaging    New small bowel mesenteric nodule and enlarged left external iliac lymph node, highly worrisome for metastatic disease. 2. Cirrhosis and splenomegaly      10/10/2015 Tumor Marker    Patient's tumor was tested for the following markers: CA125 Results of the tumor marker test revealed 53.4      10/10/2015 - 12/11/2015 Chemotherapy    She received 4 cycles of carboplatin only      11/20/2015 Tumor Marker    Patient's tumor was tested for the following markers: CA125 Results of the tumor marker test revealed 41.3      12/20/2015 Imaging    CT: Mild left external iliac lymphadenopathy is slightly decreased. Mildly  enlarged lower mesenteric nodule is slightly decreased. 2. No new or progressive metastatic disease in the abdomen or pelvis. 3. Cirrhosis. Stable subcentimeter hypodense left liver lobe lesion. No new liver lesions. 4. Stable mild splenomegaly.  No ascites. 5. Mild sigmoid diverticulosis.      01/01/2016 -  Anti-estrogen oral therapy    She was placed on tamoxifen. Had letrozole initially but switched to tamoxifen due to poor tolerance      01/15/2016 Tumor Marker    Patient's tumor was tested for the following markers: CA125 Results of the tumor marker test revealed 31.4      02/19/2016 Tumor Marker    Patient's tumor was tested for the following markers: CA125 Results  of the tumor marker test revealed 36.5      She returns for further follow-up. She remained on tamoxifen therapy. She has significant hot flashes but overall manageable.  She has poor sleep because of this. She also complained of restless leg, neuropathy from prior chemo and diabetes, leg pain and bone pain. She denies abdominal bloating, changes of bowel habits, nausea or constipation. Denies recent infection. She denies recent chest pain or shortness of breath.  MEDICAL HISTORY:  Past Medical History:  Diagnosis Date  . Asthma   . Cirrhosis ([place])   . Complication of anesthesia    slow to wake up / n & v  . Diabetes mellitus   . Elevated LFTs   . Family history of adverse reaction to anesthesia    multiple family members have had problems such as slow to wake / "flat lined" /  ventilator    . Frequency of urination   . Glaucoma, narrow-angle   . History of skin cancer   . Hypertension   . Non-ST elevated myocardial infarction (non-STEMI) [facility]) Sept 2012   Mild elevation in troponin and CK MB but no evidence of coronary disease at time of cath  . Obesity   . 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 Overactive bladder   . PONV (postoperative nausea and vomiting)   . 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 Restless leg syndrome   . Tingling    hands and feet    SURGICAL HISTORY: Past Surgical History:  Procedure Laterality Date  . ABDOMINAL ULTRASOUND  Sept 2012   No gallbladder disease, + fatty liver  . CARDIAC CATHETERIZATION  10/25/2010   EF 60%; Normal coronaries  . COLONOSCOPY  10/18/2005  . COSMETIC SURGERY  2003  . DENTAL SURGERY    . EYE SURGERY Bilateral 09/19/14 R eye, 08/2014 L eye   open angles in both eyes d/t narrow angle glaucoma  . LAPAROTOMY N/A 03/29/2014   Procedure: EXPLORATORY LAPAROTOMY;  Surgeon: Everitt Amber, MD;  Location: [facility];  Service: Gynecology;  Laterality: N/A;  . PARTIAL HYSTERECTOMY  1988  . SALPINGOOPHORECTOMY Bilateral 03/29/2014   Procedure: BILATERAL SALPINGO OOPHORECTOMY, OMENTECTOMY, DEBULKING;  Surgeon: Everitt Amber, MD;  Location: [facility];  Service: Gynecology;  Laterality: Bilateral;  . TONSILLECTOMY  1971  . TUBAL LIGATION    . [facility]  11/24/2009    SOCIAL HISTORY: Social History   Social History  . Marital status: Single    Spouse name: N/A  . Number of children: 1 D  . Years of education: N/A   Occupational History  . Retired    Social History Main Topics  . Smoking status: Former Smoker    Packs/day: 1.00    Years: 1.00    Types: Cigarettes    Quit date: 02/11/1982  . Smokeless tobacco: Never Used  . Alcohol use No  . Drug use: No  . Sexual activity: Not on file   Other Topics Concern  . Not on file   Social History Narrative  . No narrative on file    FAMILY HISTORY: Family History  Problem Relation Age of Onset  . Asthma Mother   . Diabetes Mother   . Hypertension Mother   . Allergies Mother   . Rheum arthritis Mother   . Bladder Cancer Father 56    former heavy smoker  . Hypertension Father   . Stroke Father   . Breast cancer Sister 71  . Diabetes Brother   .  Allergies Brother   . Asthma Brother   . Diabetes Sister     x 3  . Allergies Sister   . Asthma Sister     x 2   .  Breast cancer Maternal Aunt 85  . Breast cancer Cousin 82    (maternal) bilateral cancer  . Aneurysm Son 43    ALLERGIES:  is allergic to shellfish allergy; benadryl [diphenhydramine hcl]; penicillins; and tape.  MEDICATIONS:  Current Outpatient Prescriptions  Medication Sig Dispense Refill  . amLODipine (NORVASC) 5 MG tablet Take 5 mg by mouth daily.  0  . bisoprolol (ZEBETA) 5 MG tablet   0  . insulin degludec (TRESIBA) 100 UNIT/ML SOPN FlexTouch Pen Inject into the skin.    Marland Kitchen lidocaine-prilocaine (EMLA) cream Apply to Porta-cath  1-2 hours prior to access as directed. 30 g 1  . metFORMIN (GLUCOPHAGE-XR) 500 MG 24 hr tablet Take 1,000 mg by mouth 2 (two) times daily.   0  . pantoprazole (PROTONIX) 40 MG tablet Take 40 mg by mouth daily.   0  . rotigotine (NEUPRO) 2 MG/24HR Place 1 patch onto the skin daily. 30 patch 3  . tamoxifen (NOLVADEX) 20 MG tablet Take 1 tablet (20 mg total) by mouth daily. 30 tablet 2  . gabapentin (NEURONTIN) 300 MG capsule Take 1 capsule (300 mg total) by mouth 3 (three) times daily. 90 capsule 1  . HYDROcodone-acetaminophen (NORCO/VICODIN) 5-325 MG tablet take 1 tablet by mouth every 6 hours if needed for MODERATE PAIN 60 tablet 0  . loratadine (CLARITIN) 10 MG tablet Take 10 mg by mouth daily as needed for allergies.    Marland Kitchen ondansetron (ZOFRAN) 8 MG tablet Take 1 tablet (8 mg total) by mouth every 8 (eight) hours as needed for nausea or vomiting. (Patient not taking: Reported on 04/15/2016) 30 tablet 1   No current facility-administered medications for this visit.     REVIEW OF SYSTEMS:   Constitutional: Denies fevers, chills or abnormal night sweats Eyes: Denies blurriness of vision, double vision or watery eyes Ears, nose, mouth, throat, and face: Denies mucositis or sore throat Respiratory: Denies cough, dyspnea or wheezes Cardiovascular: Denies palpitation, chest discomfort or lower extremity swelling Gastrointestinal:  Denies nausea, heartburn or  change in bowel habits Skin: Denies abnormal skin rashes Lymphatics: Denies new lymphadenopathy or easy bruising Neurological:Denies numbness, tingling or new weaknesses Behavioral/Psych: Mood is stable, no new changes  All other systems were reviewed with the patient and are negative.  PHYSICAL EXAMINATION: ECOG PERFORMANCE STATUS: 1 - Symptomatic but completely ambulatory  Vitals:   04/15/16 0856  BP: (!) 151/73  Pulse: 65  Resp: 18  Temp: 98.4 F (36.9 C)   Filed Weights   04/15/16 0856  Weight: 192 lb 9.6 oz (87.4 kg)    GENERAL:alert, no distress and comfortable SKIN: skin color, texture, turgor are normal, no rashes or significant lesions EYES: normal, conjunctiva are pink and non-injected, sclera clear OROPHARYNX:no exudate, normal lips, buccal mucosa, and tongue  NECK: supple, thyroid normal size, non-tender, without nodularity LYMPH:  no palpable lymphadenopathy in the cervical, axillary or inguinal LUNGS: clear to auscultation and percussion with normal breathing effort HEART: regular rate & rhythm and no murmurs without lower extremity edema ABDOMEN:abdomen soft, non-tender and normal bowel sounds Musculoskeletal:no cyanosis of digits and no clubbing  PSYCH: alert & oriented x 3 with fluent speech NEURO: no focal motor/sensory deficits  LABORATORY DATA:  I have reviewed the data as listed Lab Results  Component Value  Date   WBC 5.1 04/15/2016   HGB 11.8 04/15/2016   HCT 36.4 04/15/2016   MCV 90.8 04/15/2016   PLT 119 (L) 04/15/2016    Recent Labs  01/15/16 1024 02/19/16 0756 04/15/16 0914  NA 141 140 141  K 4.0 3.8 4.1  CO2 23 22 22   GLUCOSE 167* 228* 194*  BUN 12.3 14.2 16.3  CREATININE 0.8 0.8 0.8  CALCIUM 8.0* 8.5 8.6  PROT 7.3 6.9 7.2  ALBUMIN 3.7 3.7 3.7  AST 69* 65* 58*  ALT 110* 93* 88*  ALKPHOS 122 141 143  BILITOT 0.74 0.55 0.52   I have personally reviewed her latest CT from November 2017 which showed: IMPRESSION: 1. Mild left  external iliac lymphadenopathy is slightly decreased. Mildly enlarged lower mesenteric nodule is slightly decreased. 2. No new or progressive metastatic disease in the abdomen or pelvis 3. Cirrhosis. Stable subcentimeter hypodense left liver lobe lesion. No new liver lesions. 4. Stable mild splenomegaly.  No ascites. 5. Mild sigmoid diverticulosis.   ASSESSMENT & PLAN:  Primary peritoneal carcinomatosis ([place]) The patient tolerated tamoxifen better than letrozole Her last imaging study was several months ago. I plan to repeat imaging study when I see her back next month We also monitoring tumor markers. Her tumor marker fluctuated recently but overall not significantly elevated. Tumor markers from today is pending.  Chemotherapy-induced neuropathy ([place]) She has leg pain, neuropathy, bone pain, restless legs and other symptom of musculoskeletal discomfort. I recommend vitamin D and calcium supplements. I think it is reasonable to give her a trial of gabapentin. I did warn her about risk of constipation and sedation with gabapentin.  Hot flashes due to tamoxifen She has significant hot flashes. We discussed treatment options. Most SSRI will interact with her medications. As above, I recommend a trial of gabapentin.  Previous small studies have suggested some benefit in reducing frequency of hot flashes so I think is reasonable to try  Liver cirrhosis secondary to [facility] [place]) Her liver cirrhosis has caused abnormal liver function, splenomegaly and thrombocytopenia.  Continue to monitor carefully  Restless legs syndrome She follows with neurologist for this. She has received intravenous iron infusion in the past. Her last ferritin level was adequate. Continue conservative management  Thrombocytopenia ([place]) This is due to liver cirrhosis and splenomegaly. She is not symptomatic. Recommend observation only.  Essential hypertension she will continue current medical management. I  recommend close follow-up with primary care doctor for medication adjustment. She has multiple cardiac risk factors. She is on tamoxifen which will increase her risk of blood clots. I recommend she resume aspirin therapy. There is no contraindication to remain on antiplatelet agents or anticoagulants as long as the platelet is greater than 50,000.     Orders Placed This Encounter  Procedures  . CT ABDOMEN PELVIS W CONTRAST    Standing Status:   Future    Standing Expiration Date:   05/20/2017    Order Specific Question:   Reason for exam:    Answer:   staging peritoneal cancer, assess response to Rx    Order Specific Question:   Preferred imaging location?    Answer:   [facility]  . Comprehensive metabolic panel    Standing Status:   Standing    Number of Occurrences:   22    Standing Expiration Date:   04/15/2017  . CBC with Differential/Platelet    Standing Status:   Standing    Number of Occurrences:   22  Standing Expiration Date:   04/15/2017  . CA 125    Standing Status:   Standing    Number of Occurrences:   9    Standing Expiration Date:   04/15/2017    All questions were answered. The patient knows to call the clinic with any problems, questions or concerns. I spent 40 minutes counseling the patient face to face. The total time spent in the appointment was 90 minutes and more than 50% was on counseling.     [name], MD 04/15/2016 3:45 PM

## 2016-04-16 LAB — CA 125: Cancer Antigen (CA) 125: 38 U/mL (ref 0.0–38.1)

## 2016-04-17 ENCOUNTER — Telehealth: Payer: Self-pay | Admitting: *Deleted

## 2016-04-17 NOTE — Telephone Encounter (Signed)
Pt called requesting results of CA125. Pt's   Phone   [phone].

## 2016-04-17 NOTE — Telephone Encounter (Signed)
Gave results of CA 125 to patient

## 2016-05-13 ENCOUNTER — Ambulatory Visit: Payer: Medicare Other

## 2016-05-13 ENCOUNTER — Encounter (HOSPITAL_COMMUNITY): Payer: Self-pay

## 2016-05-13 ENCOUNTER — Other Ambulatory Visit (HOSPITAL_BASED_OUTPATIENT_CLINIC_OR_DEPARTMENT_OTHER): Payer: Medicare Other

## 2016-05-13 ENCOUNTER — Ambulatory Visit (HOSPITAL_COMMUNITY)
Admission: RE | Admit: 2016-05-13 | Discharge: 2016-05-13 | Disposition: A | Discharge status: Home / Self Care | Payer: Medicare Other | Source: Ambulatory Visit | Attending: Hematology and Oncology | Admitting: Hematology and Oncology

## 2016-05-13 VITALS — BP 143/70 | HR 73 | Temp 98.5°F | Resp 16

## 2016-05-13 DIAGNOSIS — C482 Malignant neoplasm of peritoneum, unspecified: Secondary | ICD-10-CM | POA: Diagnosis not present

## 2016-05-13 DIAGNOSIS — Z95828 Presence of other vascular implants and grafts: Secondary | ICD-10-CM

## 2016-05-13 DIAGNOSIS — K766 Portal hypertension: Secondary | ICD-10-CM | POA: Diagnosis not present

## 2016-05-13 DIAGNOSIS — K7460 Unspecified cirrhosis of liver: Secondary | ICD-10-CM | POA: Diagnosis not present

## 2016-05-13 DIAGNOSIS — R161 Splenomegaly, not elsewhere classified: Secondary | ICD-10-CM | POA: Diagnosis not present

## 2016-05-13 LAB — COMPREHENSIVE METABOLIC PANEL
ALT: 88 U/L — ABNORMAL HIGH (ref 0–55)
AST: 59 U/L — ABNORMAL HIGH (ref 5–34)
Albumin: 3.8 g/dL (ref 3.5–5.0)
Alkaline Phosphatase: 155 U/L — ABNORMAL HIGH (ref 40–150)
Anion Gap: 14 mEq/L — ABNORMAL HIGH (ref 3–11)
BUN: 17.5 mg/dL (ref 7.0–26.0)
CO2: 20 mEq/L — ABNORMAL LOW (ref 22–29)
Calcium: 9.1 mg/dL (ref 8.4–10.4)
Chloride: 107 mEq/L (ref 98–109)
Creatinine: 0.8 mg/dL (ref 0.6–1.1)
EGFR: 73 mL/min/{1.73_m2} — ABNORMAL LOW (ref >90)
Glucose: 179 mg/dl — ABNORMAL HIGH (ref 70–140)
Potassium: 4.1 mEq/L (ref 3.5–5.1)
Sodium: 141 mEq/L (ref 136–145)
Total Bilirubin: 0.4 mg/dL (ref 0.20–1.20)
Total Protein: 7.3 g/dL (ref 6.4–8.3)

## 2016-05-13 LAB — CBC WITH DIFFERENTIAL/PLATELET
BASO%: 0.2 % (ref 0.0–2.0)
Basophils Absolute: 0 10*3/uL (ref 0.0–0.1)
EOS%: 2.1 % (ref 0.0–7.0)
Eosinophils Absolute: 0.1 10*3/uL (ref 0.0–0.5)
HCT: 35.9 % (ref 34.8–46.6)
HGB: 11.5 g/dL — ABNORMAL LOW (ref 11.6–15.9)
LYMPH%: 27.7 % (ref 14.0–49.7)
MCH: 28.5 pg (ref 25.1–34.0)
MCHC: 32 g/dL (ref 31.5–36.0)
MCV: 88.9 fL (ref 79.5–101.0)
MONO#: 0.3 10*3/uL (ref 0.1–0.9)
MONO%: 5 % (ref 0.0–14.0)
NEUT#: 3.4 10*3/uL (ref 1.5–6.5)
NEUT%: 65 % (ref 38.4–76.8)
Platelets: 120 10*3/uL — ABNORMAL LOW (ref 145–400)
RBC: 4.04 10*6/uL (ref 3.70–5.45)
RDW: 14.5 % (ref 11.2–14.5)
WBC: 5.2 10*3/uL (ref 3.9–10.3)
lymph#: 1.4 10*3/uL (ref 0.9–3.3)

## 2016-05-13 MED ORDER — IOPAMIDOL (ISOVUE-300) INJECTION 61%
INTRAVENOUS | Status: AC
  Filled 2016-05-13: qty 30

## 2016-05-13 MED ORDER — HEPARIN SOD (PORK) LOCK FLUSH 100 UNIT/ML IV SOLN
500.0000 [IU] | Freq: Once | INTRAVENOUS | Status: AC
  Administered 2016-05-13: 500 [IU] via INTRAVENOUS

## 2016-05-13 MED ORDER — SODIUM CHLORIDE 0.9% FLUSH
10.0000 mL | INTRAVENOUS | Status: DC | PRN
  Administered 2016-05-13: 10 mL via INTRAVENOUS
  Filled 2016-05-13: qty 10

## 2016-05-13 MED ORDER — HEPARIN SOD (PORK) LOCK FLUSH 100 UNIT/ML IV SOLN
INTRAVENOUS | Status: AC
  Filled 2016-05-13: qty 5

## 2016-05-13 MED ORDER — HEPARIN SOD (PORK) LOCK FLUSH 100 UNIT/ML IV SOLN
500.0000 [IU] | Freq: Once | INTRAVENOUS | Status: AC
  Administered 2016-05-13: 500 [IU] via INTRAVENOUS
  Filled 2016-05-13: qty 5

## 2016-05-13 MED ORDER — IOPAMIDOL (ISOVUE-300) INJECTION 61%
INTRAVENOUS | Status: AC
  Filled 2016-05-13: qty 100

## 2016-05-13 MED ORDER — IOPAMIDOL (ISOVUE-300) INJECTION 61%
100.0000 mL | Freq: Once | INTRAVENOUS | Status: AC | PRN
  Administered 2016-05-13: 100 mL via INTRAVENOUS

## 2016-05-13 MED ORDER — IOPAMIDOL (ISOVUE-300) INJECTION 61%
30.0000 mL | Freq: Once | INTRAVENOUS | Status: DC | PRN
  Administered 2016-05-13: 30 mL via ORAL
  Filled 2016-05-13: qty 30

## 2016-05-14 ENCOUNTER — Encounter: Payer: Self-pay | Admitting: Hematology and Oncology

## 2016-05-14 ENCOUNTER — Telehealth: Payer: Self-pay | Admitting: Hematology and Oncology

## 2016-05-14 ENCOUNTER — Ambulatory Visit (HOSPITAL_BASED_OUTPATIENT_CLINIC_OR_DEPARTMENT_OTHER): Payer: Medicare Other | Admitting: Hematology and Oncology

## 2016-05-14 DIAGNOSIS — I10 Essential (primary) hypertension: Secondary | ICD-10-CM | POA: Diagnosis not present

## 2016-05-14 DIAGNOSIS — C482 Malignant neoplasm of peritoneum, unspecified: Secondary | ICD-10-CM | POA: Diagnosis present

## 2016-05-14 DIAGNOSIS — G620 Drug-induced polyneuropathy: Secondary | ICD-10-CM

## 2016-05-14 DIAGNOSIS — K7460 Unspecified cirrhosis of liver: Secondary | ICD-10-CM

## 2016-05-14 DIAGNOSIS — R161 Splenomegaly, not elsewhere classified: Secondary | ICD-10-CM | POA: Diagnosis not present

## 2016-05-14 DIAGNOSIS — D696 Thrombocytopenia, unspecified: Secondary | ICD-10-CM

## 2016-05-14 DIAGNOSIS — C569 Malignant neoplasm of unspecified ovary: Secondary | ICD-10-CM

## 2016-05-14 DIAGNOSIS — K7581 Nonalcoholic steatohepatitis (NASH): Secondary | ICD-10-CM

## 2016-05-14 DIAGNOSIS — T451X5A Adverse effect of antineoplastic and immunosuppressive drugs, initial encounter: Secondary | ICD-10-CM

## 2016-05-14 DIAGNOSIS — D500 Iron deficiency anemia secondary to blood loss (chronic): Secondary | ICD-10-CM

## 2016-05-14 LAB — CA 125: Cancer Antigen (CA) 125: 41.2 U/mL — ABNORMAL HIGH (ref 0.0–38.1)

## 2016-05-14 MED ORDER — TAMOXIFEN CITRATE 20 MG PO TABS: 20.0000 mg | ORAL_TABLET | Freq: Every day | ORAL | 9 refills | Status: DC

## 2016-05-14 MED ORDER — HYDROCODONE-ACETAMINOPHEN 5-325 MG PO TABS: ORAL_TABLET | ORAL | 0 refills | Status: DC

## 2016-05-14 NOTE — Assessment & Plan Note (Signed)
She has leg pain, neuropathy, bone pain, restless legs and other symptom of musculoskeletal discomfort. I recommend vitamin D and calcium supplements. We discussed trial of gabapentin which she tolerated well and has given her significant symptomatic improvement of her neuropathy and bone pain We discussed potential modification of dosing based on her symptoms I will continue to reassess in her next visit I refill her prescription pain medicine but I am hopeful that we might be able to get her off pain medication in the future with gabapentin alone

## 2016-05-14 NOTE — Telephone Encounter (Signed)
Appointments scheduled per 4.3.18 LOS. Patient given AVS report and calendars with future scheduled appointments.

## 2016-05-14 NOTE — Progress Notes (Signed)
[place] OFFICE PROGRESS NOTE  Patient Care Team: Helane Rima, MD as PCP - General (Family Medicine)  SUMMARY OF ONCOLOGIC HISTORY:   Primary peritoneal carcinomatosis ([place])   09/06/2011 Imaging    CT findings consistent with cirrhotic changes involving the liver.  No worrisome liver mass.  There are associated portal venous collaterals and splenomegaly consistent with portal venous hypertension. 2.  No other significant upper abdominal findings      11/08/2013 Imaging    US showed ascites      11/08/2013 Initial Diagnosis    Patient had ~ 2 months of some urinary incontinence, saw Dr Ronita Hipps in 10-2013, with pelvic mass on exam       11/12/2013 Imaging    There are findings of progressive hepatic cirrhosis with a large amount of ascites and mild splenomegaly. There are venous collaterals present. 2. There is abnormal thickening of the peritoneal surface along the left mid and lower abdominal wall. There is abnormal soft tissue density in the pelvis as well which could reflect the clinically suspected ovarian malignancy but the findings are nondiagnostic. 3. There is a new small left pleural effusion. 4. There is no acute bowel abnormality.      11/18/2013 Imaging    Successful ultrasound guided diagnostic and therapeutic paracentesis yielding 4.1 liters of ascites.       11/18/2013 Pathology Results    PERITONEAL/ASCITIC FLUID (SPECIMEN 1 OF 1 COLLECTED 11/18/13): SEROUS CARCINOMA, PLEASE SEE COMMENT.      12/01/2013 Tumor Marker    Patient's tumor was tested for the following markers: CA125 Results of the tumor marker test revealed 1938      12/03/2013 Imaging    Successful ultrasound-guided therapeutic paracentesis yielding 3.6 liters of peritoneal fluid.      12/07/2013 - 05/30/2014 Chemotherapy    She received 3 cycles of carboplatin and Taxol, interrupted cycle 4 for surgery.  She subsequently completed 3 more cycles of chemotherapy after surgery       12/20/2013 Imaging    Successful ultrasound-guided diagnostic and therapeutic paracentesis yielding 2.2 liters of peritoneal fluid      12/20/2013 Pathology Results    PERITONEAL/ASCITIC FLUID(SPECIMEN 1 OF 1 COLLECTED 12/20/13): MALIGNANT CELLS CONSISTENT WITH SEROUS CARCINOMA      01/13/2014 Tumor Marker    Patient's tumor was tested for the following markers: CA125 Results of the tumor marker test revealed 227      02/07/2014 Tumor Marker    Patient's tumor was tested for the following markers: CA125 Results of the tumor marker test revealed 130      02/15/2014 Genetic Testing    Genetics testing from 02-2014 normal ([employer] panel)      02/23/2014 Imaging    Interval decrease in ascites. 2. Morphologic changes in the liver consistent with cirrhosis. 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 Portal vein remains patent at this time. 3. Persistent but decreased abnormal soft tissue attenuation tracking in the omental fat. This may be secondary to interval improvement in metastatic disease. 4. Peritoneal thickening seen in the left abdomen and pelvis on the previous study has decreased and nearly resolved in the interval. This also suggests interval improvement in metastatic disease.       03/07/2014 Procedure    Ultrasound and fluoroscopically guided right internal jugular single lumen power port catheter insertion. Tip in the SVC/RA junction. Catheter ready for use.      03/17/2014 Tumor Marker    Patient's tumor was tested for the  following markers: CA125 Results of the tumor marker test revealed 45      03/29/2014 Pathology Results    1. Omentum, resection for tumor - HIGH GRADE SEROUS CARCINOMA, SEE COMMENT. 2. Ovary and fallopian tube, right - HIGH GRADE SEROUS CARCINOMA, SEE COMMENT. 3. Ovary and fallopian tube, left - HIGH GRADE SEROUS CARCINOMA, SEE COMMENT. Diagnosis Note 1. Nests and clusters of malignant cells are invading the omental  tissue (part #1) with associated fibrosis. The cells are pleomorphic with prominent nucleoli. There are scattered psammoma bodies. The ovaries are atrophic and exhibit multiple foci of surface based invasive carcinoma and associated fibrosis. The fallopian tubes have a few foci of carcinoma, also superficially located. There are no precursor lesions noted in the ovaries or fallopian tubes (the entire tubes and ovaries were submitted for evaluation). While there is some retraction artifact, there are several foci suspicious for lymphovascular invasion. 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 Given the fibrosis, there does appear to be a small amount of treatment effect, however, there is abundant residual tumor.       03/29/2014 Surgery    Procedure(s) Performed: 1. Exploratory laparotomy with bilateral salpingo-oophorectomy, omentectomy radical tumor debulking for ovarian cancer .  Surgeon: Thereasa Solo, MD.  Assistant Surgeon: Lahoma Crocker, M.D. Assistant: (an MD assistant was necessary for tissue manipulation, retraction and positioning due to the complexity of the case and hospital policies).  Operative Findings: 10cm omental cake from hepatic to splenic flexure, densely adherent to transverse colon. Milliary studding of tumor implants (<75m, too numerous in number to count) adherent to the mesentery of the small bowel and small bowel wall. Small volume (100cc) ascites. Small ovaries bilaterally, densely adherent to the pelvic cul de sac. Left ovary and tube densely adherent to sigmoid colon. Cirrhotic liver with hepatomegaly and splenomegaly.     This represented an optimal cytoreduction (R1) with visible disease residual on the bowel wall and mesentery (millial, <328mimplants).        04/18/2014 Tumor Marker    Patient's tumor was tested for the  following markers: CA125 Results of the tumor marker test revealed 122      05/16/2014 Tumor Marker    Patient's tumor was tested for the following markers: CA125 Results of the tumor marker test revealed 30      06/10/2014 Tumor Marker    Patient's tumor was tested for the following markers: CA125 Results of the tumor marker test revealed 19      07/04/2014 Imaging    Interval improvement in the appearance of peritoneal metastasis secondary to ovarian cancer. 2. 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       08/18/2014 Tumor Marker    Patient's tumor was tested for the following markers: CA125 Results of the tumor marker test revealed 16      10/13/2014 Tumor Marker    Patient's tumor was tested for the following markers: CA125 Results of the tumor marker test revealed 14      11/21/2014 Tumor Marker    Patient's tumor was tested for the following markers: CA125 Results of the tumor marker test revealed 16      12/28/2014 Tumor Marker    Patient's tumor was tested for the following markers: CA125 Results of the tumor marker test revealed 762      02/27/2015 Tumor Marker  Patient's tumor was tested for the following markers: CA125 Results of the tumor marker test revealed 20.2      03/22/2015 Imaging    Filler appearance to the prior exam, with very subtle fluid tracking along portions of the liver, and very minimal nodularity along the right paracolic gutter representing residua from the prior peritoneal cancer. The current abnormalities could simply be post therapy findings rather than necessarily representing residual malignancy. No new or enlarging lesions are identified. 2. Hepatic cirrhosis and splenomegaly. There is some gastric varices suggesting portal venous hypertension. 3. Left foraminal stenosis at L4-5 due to spurring. There is likely also some central narrowing of the thecal sac at this level. 4. Bibasilar scarring. 5. Chronic mild left mid  kidney scarring      03/27/2015 Tumor Marker    Patient's tumor was tested for the following markers: CA125 Results of the tumor marker test revealed 25.3      04/24/2015 Imaging    Disc bulge L2-3 with mild spinal stenosis and right foraminal encroachment. 2. Disc bulge L3-4 with mild spinal stenosis. 3. Multifactorial spinal, left lateral recess and foraminal stenosis L4-5. There is grade 1 anterolisthesis without evident dynamic instability.      05/29/2015 Tumor Marker    Patient's tumor was tested for the following markers: CA125 Results of the tumor marker test revealed 29.9      08/21/2015 Tumor Marker    Patient's tumor was tested for the following markers: CA125 Results of the tumor marker test revealed 45      09/18/2015 Tumor Marker    Patient's tumor was tested for the following markers: CA125 Results of the tumor marker test revealed 47      09/27/2015 Imaging    New small bowel mesenteric nodule and enlarged left external iliac lymph node, highly worrisome for metastatic disease. 2. Cirrhosis and splenomegaly      10/10/2015 Tumor Marker    Patient's tumor was tested for the following markers: CA125 Results of the tumor marker test revealed 53.4      10/10/2015 - 12/11/2015 Chemotherapy    She received 4 cycles of carboplatin only      11/20/2015 Tumor Marker    Patient's tumor was tested for the following markers: CA125 Results of the tumor marker test revealed 41.3      12/20/2015 Imaging    CT: Mild left external iliac lymphadenopathy is slightly decreased. Mildly enlarged lower mesenteric nodule is slightly decreased. 2. No new or progressive metastatic disease in the abdomen or pelvis. 3. Cirrhosis. Stable subcentimeter hypodense left liver lobe lesion. No new liver lesions. 4. Stable mild splenomegaly.  No ascites. 5. Mild sigmoid diverticulosis.      01/01/2016 -  Anti-estrogen oral therapy    She was placed on tamoxifen. Had letrozole initially but  switched to tamoxifen due to poor tolerance      01/15/2016 Tumor Marker    Patient's tumor was tested for the following markers: CA125 Results of the tumor marker test revealed 31.4      02/19/2016 Tumor Marker    Patient's tumor was tested for the following markers: CA125 Results of the tumor marker test revealed 36.5      05/13/2016 Imaging    No evidence of disease progression within the abdomen or pelvis. Previously noted small central mesenteric nodule and left external iliac node appear slightly smaller. 2. Stable changes of hepatic cirrhosis and portal hypertension with associated splenomegaly. No new or enlarging hepatic lesions are identified. 3.  No acute findings.      05/13/2016 Tumor Marker    Patient's tumor was tested for the following markers: CA125 Results of the tumor marker test revealed 41.2       INTERVAL HISTORY: Please see below for problem oriented charting. She returns with her husband to review test results From our prior visit, I recommended aspirin, gabapentin, calcium with vitamin D supplement She felt better since starting on gabapentin She has some mild sedative effect but not significant She is able to walk better and some of her neuropathy pain is drastically improved She denies major hot flashes with tamoxifen  REVIEW OF SYSTEMS:   Constitutional: Denies fevers, chills or abnormal weight loss Eyes: Denies blurriness of vision Ears, nose, mouth, throat, and face: Denies mucositis or sore throat Respiratory: Denies cough, dyspnea or wheezes Cardiovascular: Denies palpitation, chest discomfort or lower extremity swelling Gastrointestinal:  Denies nausea, heartburn or change in bowel habits Skin: Denies abnormal skin rashes Lymphatics: Denies new lymphadenopathy or easy bruising Neurological:Denies numbness, tingling or new weaknesses Behavioral/Psych: Mood is stable, no new changes  All other systems were reviewed with the patient and are  negative.  I have reviewed the past medical history, past surgical history, social history and family history with the patient and they are unchanged from previous note.  ALLERGIES:  is allergic to shellfish allergy; benadryl [diphenhydramine hcl]; penicillins; and tape.  MEDICATIONS:  Current Outpatient Prescriptions  Medication Sig Dispense Refill  . amLODipine (NORVASC) 5 MG tablet Take 5 mg by mouth daily.  0  . bisoprolol (ZEBETA) 5 MG tablet   0  . gabapentin (NEURONTIN) 300 MG capsule Take 1 capsule (300 mg total) by mouth 3 (three) times daily. 90 capsule 1  . HYDROcodone-acetaminophen (NORCO/VICODIN) 5-325 MG tablet take 1 tablet by mouth every 6 hours if needed for MODERATE PAIN 60 tablet 0  . insulin degludec (TRESIBA) 100 UNIT/ML SOPN FlexTouch Pen Inject into the skin.    Marland Kitchen lidocaine-prilocaine (EMLA) cream Apply to Porta-cath  1-2 hours prior to access as directed. 30 g 1  . loratadine (CLARITIN) 10 MG tablet Take 10 mg by mouth daily as needed for allergies.    . metFORMIN (GLUCOPHAGE-XR) 500 MG 24 hr tablet Take 1,000 mg by mouth 2 (two) times daily.   0  . pantoprazole (PROTONIX) 40 MG tablet Take 40 mg by mouth daily.   0  . rotigotine (NEUPRO) 2 MG/24HR Place 1 patch onto the skin daily. 30 patch 3  . tamoxifen (NOLVADEX) 20 MG tablet Take 1 tablet (20 mg total) by mouth daily. 90 tablet 9  . ondansetron (ZOFRAN) 8 MG tablet Take 1 tablet (8 mg total) by mouth every 8 (eight) hours as needed for nausea or vomiting. (Patient not taking: Reported on 04/15/2016) 30 tablet 1   No current facility-administered medications for this visit.     PHYSICAL EXAMINATION: ECOG PERFORMANCE STATUS: 1 - Symptomatic but completely ambulatory  Vitals:   05/14/16 0846  BP: (!) 146/57  Pulse: 73  Resp: 18  Temp: 98.2 F (36.8 C)   Filed Weights   05/14/16 0846  Weight: 194 lb 8 oz (88.2 kg)    GENERAL:alert, no distress and comfortable SKIN: skin color, texture, turgor are  normal, no rashes or significant lesions EYES: normal, Conjunctiva are pink and non-injected, sclera clear Musculoskeletal:no cyanosis of digits and no clubbing  NEURO: alert & oriented x 3 with fluent speech, no focal motor/sensory deficits  LABORATORY DATA:  I have reviewed  the data as listed    Component Value Date/Time   NA 141 05/13/2016 0755   K 4.1 05/13/2016 0755   CL 105 03/30/2014 0618   CO2 20 (L) 05/13/2016 0755   GLUCOSE 179 (H) 05/13/2016 0755   BUN 17.5 05/13/2016 0755   CREATININE 0.8 05/13/2016 0755   CALCIUM 9.1 05/13/2016 0755   PROT 7.3 05/13/2016 0755   ALBUMIN 3.8 05/13/2016 0755   AST 59 (H) 05/13/2016 0755   ALT 88 (H) 05/13/2016 0755   ALKPHOS 155 (H) 05/13/2016 0755   BILITOT 0.40 05/13/2016 0755   GFRNONAA 86 (L) 03/30/2014 0618   GFRAA >90 03/30/2014 0618    No results found for: SPEP, UPEP  Lab Results  Component Value Date   WBC 5.2 05/13/2016   NEUTROABS 3.4 05/13/2016   HGB 11.5 (L) 05/13/2016   HCT 35.9 05/13/2016   MCV 88.9 05/13/2016   PLT 120 (L) 05/13/2016      Chemistry      Component Value Date/Time   NA 141 05/13/2016 0755   K 4.1 05/13/2016 0755   CL 105 03/30/2014 0618   CO2 20 (L) 05/13/2016 0755   BUN 17.5 05/13/2016 0755   CREATININE 0.8 05/13/2016 0755      Component Value Date/Time   CALCIUM 9.1 05/13/2016 0755   ALKPHOS 155 (H) 05/13/2016 0755   AST 59 (H) 05/13/2016 0755   ALT 88 (H) 05/13/2016 0755   BILITOT 0.40 05/13/2016 0755       RADIOGRAPHIC STUDIES: I reviewed the imaging study with the patient and her husband I have personally reviewed the radiological images as listed and agreed with the findings in the report. Ct Abdomen Pelvis W Contrast  Result Date: 05/13/2016 CLINICAL DATA:  Ovarian cancer status post total hysterectomy, omental resection and tamoxifen therapy (in progress). EXAM: CT ABDOMEN AND PELVIS WITH CONTRAST TECHNIQUE: Multidetector CT imaging of the abdomen and pelvis was performed  using the standard protocol following bolus administration of intravenous contrast. CONTRAST:  177m ISOVUE-300 IOPAMIDOL (ISOVUE-300) INJECTION 61% COMPARISON:  CT 12/20/2015 and 09/27/2015. FINDINGS: Lower chest: Stable linear scarring at both lung bases. There is no significant pleural or pericardial effusion. Hepatobiliary: Diffuse contour irregularity and heterogeneity of the liver parenchyma are again noted consistent with cirrhosis and probable regenerating nodules. No new or enlarging focal lesions are seen. No evidence of gallstones, gallbladder wall thickening or biliary dilatation. Pancreas: Unremarkable. No pancreatic ductal dilatation or surrounding inflammatory changes. Spleen: Stable splenomegaly. Craniocaudal splenic length is 13.9 cm, similar to previous study. No focal lesion identified. Adrenals/Urinary Tract: Both adrenal glands appear normal. There is stable cortical scarring anteriorly in the interpolar region of the left kidney. The right kidney appears normal. No evidence of renal mass, urinary tract calculus or hydronephrosis. The bladder appears unremarkable. Stomach/Bowel: No evidence of bowel wall thickening, distention or surrounding inflammatory change. The appendix appears normal. Mild sigmoid diverticulosis. Vascular/Lymphatic: No significant vascular findings. There is a retroaortic left renal vein. Left external iliac node measuring 9 mm on image 70 is similar to the previous study (10 mm). No new or enlarging abdominopelvic lymph nodes. Reproductive: Hysterectomy.  No evidence of adnexal mass. Other: Previously demonstrated central mesenteric nodule is not as well seen, but appears smaller, measuring 7 mm on coronal image number 40. No new or enlarging peritoneal nodules. There is no ascites. Postsurgical changes are present within the anterior abdominal wall. Musculoskeletal: No acute or significant osseous findings. Stable chronic Schmorl's node involving the superior endplate of  L3. Stable chronic endplate degenerative changes and anterolisthesis at L4-5 with left-sided foraminal narrowing. Bilateral breast implants are partially visualized. IMPRESSION: 1. No evidence of disease progression within the abdomen or pelvis. Previously noted small central mesenteric nodule and left external iliac node appear slightly smaller. 2. Stable changes of hepatic cirrhosis and portal hypertension with associated splenomegaly. No new or enlarging hepatic lesions are identified. 3. No acute findings. Electronically Signed   By: Richardean Sale M.D.   On: 05/13/2016 13:21    ASSESSMENT & PLAN:  Primary peritoneal carcinomatosis ([place]) We reviewed imaging study CT scan show no evidence of disease progression Her tumor marker fluctuated up and down but overall, she has responded well to tamoxifen I recommend we continue treatment without modification I do not plan to repeat imaging study at least for another 6 months, due in October 2018  Thrombocytopenia [facility]) This is due to liver cirrhosis and splenomegaly. She is not symptomatic. Recommend observation only.  Liver cirrhosis secondary to NASH [facility]) Her liver cirrhosis has caused abnormal liver function, splenomegaly and thrombocytopenia.  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 I recommend vitamin D and calcium supplements. We discussed trial of gabapentin which she tolerated well and has given her significant symptomatic improvement of her neuropathy and bone pain We discussed potential modification of dosing based on her symptoms I will continue to reassess in her next visit I refill her prescription pain medicine but I am hopeful that we might be able to get her off pain medication in the future with gabapentin alone  Essential hypertension she will continue current medical management. I  recommend close follow-up with primary care doctor for medication adjustment. I recommend she resume aspirin therapy. There is no contraindication to remain on antiplatelet agents or anticoagulants as long as the platelet is greater than 50,000.     No orders of the defined types were placed in this encounter.  All questions were answered. The patient knows to call the clinic with any problems, questions or concerns. No barriers to learning was detected. I spent 20 minutes counseling the patient face to face. The total time spent in the appointment was 25 minutes and more than 50% was on counseling and review of test results     [name], MD 05/14/2016 10:25 AM

## 2016-05-14 NOTE — Assessment & Plan Note (Signed)
she will continue current medical management. I recommend close follow-up with primary care doctor for medication adjustment. I recommend she resume aspirin therapy. There is no contraindication to remain on antiplatelet agents or anticoagulants as long as the platelet is greater than 50,000.

## 2016-05-14 NOTE — Assessment & Plan Note (Signed)
Her liver cirrhosis has caused abnormal liver function, splenomegaly and thrombocytopenia.  Continue to monitor carefully

## 2016-05-14 NOTE — Progress Notes (Signed)
Received PA request for Lidocaine-Prilocaine cream. Called [employer](Shwana)@[phone] to attempt to do phone PA. She states it has to be done via Cover My Meds,[employer] website or form. I requested form be faxed and for her to start PA. Once fax received, will have RN complete and fax back.

## 2016-05-14 NOTE — Assessment & Plan Note (Signed)
We reviewed imaging study CT scan show no evidence of disease progression Her tumor marker fluctuated up and down but overall, she has responded well to tamoxifen I recommend we continue treatment without modification I do not plan to repeat imaging study at least for another 6 months, due in October 2018

## 2016-05-14 NOTE — Assessment & Plan Note (Signed)
This is due to liver cirrhosis and splenomegaly. She is not symptomatic. Recommend observation only.

## 2016-05-15 ENCOUNTER — Encounter: Payer: Self-pay | Admitting: Hematology and Oncology

## 2016-05-15 NOTE — Progress Notes (Signed)
Received PA approval for Lidocaine -Prilocaine via voicemail from [place] w/ [employer]. Approved 05/14/16-05/14/17. Approval will be faxed as well. Called [email or web address] [phone] and spoke with Colletta [place] to advise of the approval. She ran it and states it went through and would be $30. Asked her if she would contact patient to advise. She states she will.

## 2016-06-08 ENCOUNTER — Other Ambulatory Visit: Payer: Self-pay | Admitting: Hematology and Oncology

## 2016-06-24 ENCOUNTER — Other Ambulatory Visit (HOSPITAL_BASED_OUTPATIENT_CLINIC_OR_DEPARTMENT_OTHER): Payer: Medicare Other

## 2016-06-24 ENCOUNTER — Encounter: Payer: Self-pay | Admitting: Hematology and Oncology

## 2016-06-24 ENCOUNTER — Ambulatory Visit (HOSPITAL_BASED_OUTPATIENT_CLINIC_OR_DEPARTMENT_OTHER): Payer: Medicare Other | Admitting: Hematology and Oncology

## 2016-06-24 ENCOUNTER — Telehealth: Payer: Self-pay | Admitting: Hematology and Oncology

## 2016-06-24 ENCOUNTER — Ambulatory Visit: Payer: Medicare Other

## 2016-06-24 VITALS — BP 144/64 | HR 78 | Temp 98.7°F | Resp 18 | Ht 62.0 in | Wt 192.7 lb

## 2016-06-24 DIAGNOSIS — C569 Malignant neoplasm of unspecified ovary: Secondary | ICD-10-CM

## 2016-06-24 DIAGNOSIS — G620 Drug-induced polyneuropathy: Secondary | ICD-10-CM

## 2016-06-24 DIAGNOSIS — C482 Malignant neoplasm of peritoneum, unspecified: Secondary | ICD-10-CM

## 2016-06-24 DIAGNOSIS — D61818 Other pancytopenia: Secondary | ICD-10-CM

## 2016-06-24 DIAGNOSIS — T451X5A Adverse effect of antineoplastic and immunosuppressive drugs, initial encounter: Secondary | ICD-10-CM

## 2016-06-24 LAB — CBC WITH DIFFERENTIAL/PLATELET
BASO%: 0.2 % (ref 0.0–2.0)
Basophils Absolute: 0 10*3/uL (ref 0.0–0.1)
EOS%: 2.3 % (ref 0.0–7.0)
Eosinophils Absolute: 0.1 10*3/uL (ref 0.0–0.5)
HCT: 33.8 % — ABNORMAL LOW (ref 34.8–46.6)
HGB: 10.8 g/dL — ABNORMAL LOW (ref 11.6–15.9)
LYMPH%: 24.2 % (ref 14.0–49.7)
MCH: 27.6 pg (ref 25.1–34.0)
MCHC: 32 g/dL (ref 31.5–36.0)
MCV: 86.2 fL (ref 79.5–101.0)
MONO#: 0.2 10*3/uL (ref 0.1–0.9)
MONO%: 4.7 % (ref 0.0–14.0)
NEUT#: 2.9 10*3/uL (ref 1.5–6.5)
NEUT%: 68.6 % (ref 38.4–76.8)
Platelets: 116 10*3/uL — ABNORMAL LOW (ref 145–400)
RBC: 3.92 10*6/uL (ref 3.70–5.45)
RDW: 14.9 % — ABNORMAL HIGH (ref 11.2–14.5)
WBC: 4.3 10*3/uL (ref 3.9–10.3)
lymph#: 1 10*3/uL (ref 0.9–3.3)

## 2016-06-24 LAB — COMPREHENSIVE METABOLIC PANEL
ALT: 67 U/L — ABNORMAL HIGH (ref 0–55)
AST: 52 U/L — ABNORMAL HIGH (ref 5–34)
Albumin: 3.7 g/dL (ref 3.5–5.0)
Alkaline Phosphatase: 138 U/L (ref 40–150)
Anion Gap: 12 mEq/L — ABNORMAL HIGH (ref 3–11)
BUN: 14.8 mg/dL (ref 7.0–26.0)
CO2: 24 mEq/L (ref 22–29)
Calcium: 8.6 mg/dL (ref 8.4–10.4)
Chloride: 106 mEq/L (ref 98–109)
Creatinine: 0.9 mg/dL (ref 0.6–1.1)
EGFR: 70 mL/min/{1.73_m2} — ABNORMAL LOW (ref >90)
Glucose: 258 mg/dl — ABNORMAL HIGH (ref 70–140)
Potassium: 4.1 mEq/L (ref 3.5–5.1)
Sodium: 142 mEq/L (ref 136–145)
Total Bilirubin: 0.46 mg/dL (ref 0.20–1.20)
Total Protein: 7.2 g/dL (ref 6.4–8.3)

## 2016-06-24 MED ORDER — HYDROCODONE-ACETAMINOPHEN 5-325 MG PO TABS: ORAL_TABLET | ORAL | 0 refills | Status: DC

## 2016-06-24 MED ORDER — HEPARIN SOD (PORK) LOCK FLUSH 100 UNIT/ML IV SOLN
500.0000 [IU] | Freq: Once | INTRAVENOUS | Status: AC
  Administered 2016-06-24: 500 [IU]
  Filled 2016-06-24: qty 5

## 2016-06-24 MED ORDER — SODIUM CHLORIDE 0.9% FLUSH
10.0000 mL | Freq: Once | INTRAVENOUS | Status: AC
  Administered 2016-06-24: 10 mL
  Filled 2016-06-24: qty 10

## 2016-06-24 NOTE — Progress Notes (Signed)
[place] OFFICE PROGRESS NOTE  Patient Care Team: Helane Rima, MD as PCP - General (Family Medicine)  SUMMARY OF ONCOLOGIC HISTORY:   Primary peritoneal carcinomatosis ([place])   09/06/2011 Imaging    CT findings consistent with cirrhotic changes involving the liver.  No worrisome liver mass.  There are associated portal venous collaterals and splenomegaly consistent with portal venous hypertension. 2.  No other significant upper abdominal findings      11/08/2013 Imaging    US showed ascites      11/08/2013 Initial Diagnosis    Patient had ~ 2 months of some urinary incontinence, saw Dr Ronita Hipps in 10-2013, with pelvic mass on exam       11/12/2013 Imaging    There are findings of progressive hepatic cirrhosis with a large amount of ascites and mild splenomegaly. There are venous collaterals present. 2. There is abnormal thickening of the peritoneal surface along the left mid and lower abdominal wall. There is abnormal soft tissue density in the pelvis as well which could reflect the clinically suspected ovarian malignancy but the findings are nondiagnostic. 3. There is a new small left pleural effusion. 4. There is no acute bowel abnormality.      11/18/2013 Imaging    Successful ultrasound guided diagnostic and therapeutic paracentesis yielding 4.1 liters of ascites.       11/18/2013 Pathology Results    PERITONEAL/ASCITIC FLUID (SPECIMEN 1 OF 1 COLLECTED 11/18/13): SEROUS CARCINOMA, PLEASE SEE COMMENT.      12/01/2013 Tumor Marker    Patient's tumor was tested for the following markers: CA125 Results of the tumor marker test revealed 1938      12/03/2013 Imaging    Successful ultrasound-guided therapeutic paracentesis yielding 3.6 liters of peritoneal fluid.      12/07/2013 - 05/30/2014 Chemotherapy    She received 3 cycles of carboplatin and Taxol, interrupted cycle 4 for surgery.  She subsequently completed 3 more cycles of chemotherapy after surgery       12/20/2013 Imaging    Successful ultrasound-guided diagnostic and therapeutic paracentesis yielding 2.2 liters of peritoneal fluid      12/20/2013 Pathology Results    PERITONEAL/ASCITIC FLUID(SPECIMEN 1 OF 1 COLLECTED 12/20/13): MALIGNANT CELLS CONSISTENT WITH SEROUS CARCINOMA      01/13/2014 Tumor Marker    Patient's tumor was tested for the following markers: CA125 Results of the tumor marker test revealed 227      02/07/2014 Tumor Marker    Patient's tumor was tested for the following markers: CA125 Results of the tumor marker test revealed 130      02/15/2014 Genetic Testing    Genetics testing from 02-2014 normal ([employer] panel)      02/23/2014 Imaging    Interval decrease in ascites. 2. Morphologic changes in the liver consistent with cirrhosis. 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 Portal vein remains patent at this time. 3. Persistent but decreased abnormal soft tissue attenuation tracking in the omental fat. This may be secondary to interval improvement in metastatic disease. 4. Peritoneal thickening seen in the left abdomen and pelvis on the previous study has decreased and nearly resolved in the interval. This also suggests interval improvement in metastatic disease.       03/07/2014 Procedure    Ultrasound and fluoroscopically guided right internal jugular single lumen power port catheter insertion. Tip in the SVC/RA junction. Catheter ready for use.      03/17/2014 Tumor Marker    Patient's tumor was tested for the  following markers: CA125 Results of the tumor marker test revealed 45      03/29/2014 Pathology Results    1. Omentum, resection for tumor - HIGH GRADE SEROUS CARCINOMA, SEE COMMENT. 2. Ovary and fallopian tube, right - HIGH GRADE SEROUS CARCINOMA, SEE COMMENT. 3. Ovary and fallopian tube, left - HIGH GRADE SEROUS CARCINOMA, SEE COMMENT. Diagnosis Note 1. Nests and clusters of malignant cells are invading the omental  tissue (part #1) with associated fibrosis. The cells are pleomorphic with prominent nucleoli. There are scattered psammoma bodies. The ovaries are atrophic and exhibit multiple foci of surface based invasive carcinoma and associated fibrosis. The fallopian tubes have a few foci of carcinoma, also superficially located. There are no precursor lesions noted in the ovaries or fallopian tubes (the entire tubes and ovaries were submitted for evaluation). While there is some retraction artifact, there are several foci suspicious for lymphovascular invasion. 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 Given the fibrosis, there does appear to be a small amount of treatment effect, however, there is abundant residual tumor.       03/29/2014 Surgery    Procedure(s) Performed: 1. Exploratory laparotomy with bilateral salpingo-oophorectomy, omentectomy radical tumor debulking for ovarian cancer .  Surgeon: Thereasa Solo, MD.  Assistant Surgeon: Lahoma Crocker, M.D. Assistant: (an MD assistant was necessary for tissue manipulation, retraction and positioning due to the complexity of the case and hospital policies).  Operative Findings: 10cm omental cake from hepatic to splenic flexure, densely adherent to transverse colon. Milliary studding of tumor implants (<64m, too numerous in number to count) adherent to the mesentery of the small bowel and small bowel wall. Small volume (100cc) ascites. Small ovaries bilaterally, densely adherent to the pelvic cul de sac. Left ovary and tube densely adherent to sigmoid colon. Cirrhotic liver with hepatomegaly and splenomegaly.     This represented an optimal cytoreduction (R1) with visible disease residual on the bowel wall and mesentery (millial, <370mimplants).        04/18/2014 Tumor Marker    Patient's tumor was tested for the  following markers: CA125 Results of the tumor marker test revealed 122      05/16/2014 Tumor Marker    Patient's tumor was tested for the following markers: CA125 Results of the tumor marker test revealed 30      06/10/2014 Tumor Marker    Patient's tumor was tested for the following markers: CA125 Results of the tumor marker test revealed 19      07/04/2014 Imaging    Interval improvement in the appearance of peritoneal metastasis secondary to ovarian cancer. 2. 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       08/18/2014 Tumor Marker    Patient's tumor was tested for the following markers: CA125 Results of the tumor marker test revealed 16      10/13/2014 Tumor Marker    Patient's tumor was tested for the following markers: CA125 Results of the tumor marker test revealed 14      11/21/2014 Tumor Marker    Patient's tumor was tested for the following markers: CA125 Results of the tumor marker test revealed 16      12/28/2014 Tumor Marker    Patient's tumor was tested for the following markers: CA125 Results of the tumor marker test revealed 762      02/27/2015 Tumor Marker  Patient's tumor was tested for the following markers: CA125 Results of the tumor marker test revealed 20.2      03/22/2015 Imaging    Filler appearance to the prior exam, with very subtle fluid tracking along portions of the liver, and very minimal nodularity along the right paracolic gutter representing residua from the prior peritoneal cancer. The current abnormalities could simply be post therapy findings rather than necessarily representing residual malignancy. No new or enlarging lesions are identified. 2. Hepatic cirrhosis and splenomegaly. There is some gastric varices suggesting portal venous hypertension. 3. Left foraminal stenosis at L4-5 due to spurring. There is likely also some central narrowing of the thecal sac at this level. 4. Bibasilar scarring. 5. Chronic mild left mid  kidney scarring      03/27/2015 Tumor Marker    Patient's tumor was tested for the following markers: CA125 Results of the tumor marker test revealed 25.3      04/24/2015 Imaging    Disc bulge L2-3 with mild spinal stenosis and right foraminal encroachment. 2. Disc bulge L3-4 with mild spinal stenosis. 3. Multifactorial spinal, left lateral recess and foraminal stenosis L4-5. There is grade 1 anterolisthesis without evident dynamic instability.      05/29/2015 Tumor Marker    Patient's tumor was tested for the following markers: CA125 Results of the tumor marker test revealed 29.9      08/21/2015 Tumor Marker    Patient's tumor was tested for the following markers: CA125 Results of the tumor marker test revealed 45      09/18/2015 Tumor Marker    Patient's tumor was tested for the following markers: CA125 Results of the tumor marker test revealed 47      09/27/2015 Imaging    New small bowel mesenteric nodule and enlarged left external iliac lymph node, highly worrisome for metastatic disease. 2. Cirrhosis and splenomegaly      10/10/2015 Tumor Marker    Patient's tumor was tested for the following markers: CA125 Results of the tumor marker test revealed 53.4      10/10/2015 - 12/11/2015 Chemotherapy    She received 4 cycles of carboplatin only      11/20/2015 Tumor Marker    Patient's tumor was tested for the following markers: CA125 Results of the tumor marker test revealed 41.3      12/20/2015 Imaging    CT: Mild left external iliac lymphadenopathy is slightly decreased. Mildly enlarged lower mesenteric nodule is slightly decreased. 2. No new or progressive metastatic disease in the abdomen or pelvis. 3. Cirrhosis. Stable subcentimeter hypodense left liver lobe lesion. No new liver lesions. 4. Stable mild splenomegaly.  No ascites. 5. Mild sigmoid diverticulosis.      01/01/2016 -  Anti-estrogen oral therapy    She was placed on tamoxifen. Had letrozole initially but  switched to tamoxifen due to poor tolerance      01/15/2016 Tumor Marker    Patient's tumor was tested for the following markers: CA125 Results of the tumor marker test revealed 31.4      02/19/2016 Tumor Marker    Patient's tumor was tested for the following markers: CA125 Results of the tumor marker test revealed 36.5      05/13/2016 Imaging    No evidence of disease progression within the abdomen or pelvis. Previously noted small central mesenteric nodule and left external iliac node appear slightly smaller. 2. Stable changes of hepatic cirrhosis and portal hypertension with associated splenomegaly. No new or enlarging hepatic lesions are identified. 3.  No acute findings.      05/13/2016 Tumor Marker    Patient's tumor was tested for the following markers: CA125 Results of the tumor marker test revealed 41.2       INTERVAL HISTORY: Please see below for problem oriented charting. She returns for further follow-up. She continues to have significant improvement of activities of daily living with gabapentin and chronic pain management. She is able to be more active. She has some good days and bad days. So far, she denies excessive sedation effect or constipation with gabapentin.  REVIEW OF SYSTEMS:   Constitutional: Denies fevers, chills or abnormal weight loss Eyes: Denies blurriness of vision Ears, nose, mouth, throat, and face: Denies mucositis or sore throat Respiratory: Denies cough, dyspnea or wheezes Cardiovascular: Denies palpitation, chest discomfort or lower extremity swelling Gastrointestinal:  Denies nausea, heartburn or change in bowel habits Skin: Denies abnormal skin rashes Lymphatics: Denies new lymphadenopathy or easy bruising Neurological:Denies numbness, tingling or new weaknesses Behavioral/Psych: Mood is stable, no new changes  All other systems were reviewed with the patient and are negative.  I have reviewed the past medical history, past surgical history,  social history and family history with the patient and they are unchanged from previous note.  ALLERGIES:  is allergic to shellfish allergy; benadryl [diphenhydramine hcl]; penicillins; and tape.  MEDICATIONS:  Current Outpatient Prescriptions  Medication Sig Dispense Refill  . amLODipine (NORVASC) 5 MG tablet Take 5 mg by mouth daily.  0  . bisoprolol (ZEBETA) 5 MG tablet   0  . gabapentin (NEURONTIN) 300 MG capsule take 1 capsule by mouth three times a day 90 capsule 1  . HYDROcodone-acetaminophen (NORCO/VICODIN) 5-325 MG tablet take 1 tablet by mouth every 6 hours if needed for MODERATE PAIN 60 tablet 0  . insulin degludec (TRESIBA) 100 UNIT/ML SOPN FlexTouch Pen Inject 11 Units into the skin daily.     Marland Kitchen lidocaine-prilocaine (EMLA) cream Apply to Porta-cath  1-2 hours prior to access as directed. 30 g 1  . loratadine (CLARITIN) 10 MG tablet Take 10 mg by mouth daily as needed for allergies.    . metFORMIN (GLUCOPHAGE-XR) 500 MG 24 hr tablet Take 1,000 mg by mouth 2 (two) times daily.   0  . ondansetron (ZOFRAN) 8 MG tablet Take 1 tablet (8 mg total) by mouth every 8 (eight) hours as needed for nausea or vomiting. 30 tablet 1  . pantoprazole (PROTONIX) 40 MG tablet Take 40 mg by mouth daily.   0  . rotigotine (NEUPRO) 2 MG/24HR Place 1 patch onto the skin daily. 30 patch 3  . tamoxifen (NOLVADEX) 20 MG tablet Take 1 tablet (20 mg total) by mouth daily. 90 tablet 9   No current facility-administered medications for this visit.     PHYSICAL EXAMINATION: ECOG PERFORMANCE STATUS: 1 - Symptomatic but completely ambulatory  Vitals:   06/24/16 0845  BP: (!) 144/64  Pulse: 78  Resp: 18  Temp: 98.7 F (37.1 C)   Filed Weights   06/24/16 0845  Weight: 192 lb 11.2 oz (87.4 kg)    GENERAL:alert, no distress and comfortable SKIN: skin color, texture, turgor are normal, no rashes or significant lesions EYES: normal, Conjunctiva are pink and non-injected, sclera clear OROPHARYNX:no  exudate, no erythema and lips, buccal mucosa, and tongue normal  NECK: supple, thyroid normal size, non-tender, without nodularity LYMPH:  no palpable lymphadenopathy in the cervical, axillary or inguinal LUNGS: clear to auscultation and percussion with normal breathing effort HEART: regular rate &  rhythm and no murmurs and no lower extremity edema ABDOMEN:abdomen soft, non-tender and normal bowel sounds Musculoskeletal:no cyanosis of digits and no clubbing  NEURO: alert & oriented x 3 with fluent speech, no focal motor/sensory deficits  LABORATORY DATA:  I have reviewed the data as listed    Component Value Date/Time   NA 142 06/24/2016 0816   K 4.1 06/24/2016 0816   CL 105 03/30/2014 0618   CO2 24 06/24/2016 0816   GLUCOSE 258 (H) 06/24/2016 0816   BUN 14.8 06/24/2016 0816   CREATININE 0.9 06/24/2016 0816   CALCIUM 8.6 06/24/2016 0816   PROT 7.2 06/24/2016 0816   ALBUMIN 3.7 06/24/2016 0816   AST 52 (H) 06/24/2016 0816   ALT 67 (H) 06/24/2016 0816   ALKPHOS 138 06/24/2016 0816   BILITOT 0.46 06/24/2016 0816   GFRNONAA 86 (L) 03/30/2014 0618   GFRAA >90 03/30/2014 0618    No results found for: SPEP, UPEP  Lab Results  Component Value Date   WBC 4.3 06/24/2016   NEUTROABS 2.9 06/24/2016   HGB 10.8 (L) 06/24/2016   HCT 33.8 (L) 06/24/2016   MCV 86.2 06/24/2016   PLT 116 (L) 06/24/2016      Chemistry      Component Value Date/Time   NA 142 06/24/2016 0816   K 4.1 06/24/2016 0816   CL 105 03/30/2014 0618   CO2 24 06/24/2016 0816   BUN 14.8 06/24/2016 0816   CREATININE 0.9 06/24/2016 0816      Component Value Date/Time   CALCIUM 8.6 06/24/2016 0816   ALKPHOS 138 06/24/2016 0816   AST 52 (H) 06/24/2016 0816   ALT 67 (H) 06/24/2016 0816   BILITOT 0.46 06/24/2016 0816      ASSESSMENT & PLAN:  Primary peritoneal carcinomatosis ([place]) We reviewed imaging study CT scan show no evidence of disease progression Her tumor marker fluctuated up and down but overall,  she has responded well to tamoxifen I recommend we continue treatment without modification I do not plan to repeat imaging study at least for another 6 months, due in October 2018  Chemotherapy-induced neuropathy ([place]) She has leg pain, neuropathy, bone pain, restless legs and other symptom of musculoskeletal discomfort. I recommend vitamin D and calcium supplements. We discussed trial of gabapentin which she tolerated well and has given her significant symptomatic improvement of her neuropathy and bone pain We discussed potential modification of dosing based on her symptoms; I plan to increase gabapentin to 600 mg twice a day I will continue to reassess in her next visit I refill her prescription pain medicine but I am hopeful that we might be able to get her off pain medication in the future with gabapentin alone  Pancytopenia, acquired ([place]) She has mild anemia likely anemia chronic disease. The thrombocytopenia is likely due to her liver disease. She is not symptomatic.  Recommend close observation only   No orders of the defined types were placed in this encounter.  All questions were answered. The patient knows to call the clinic with any problems, questions or concerns. No barriers to learning was detected. I spent 15 minutes counseling the patient face to face. The total time spent in the appointment was 20 minutes and more than 50% was on counseling and review of test results     [name], MD 06/24/2016 10:35 AM

## 2016-06-24 NOTE — Assessment & Plan Note (Signed)
She has mild anemia likely anemia chronic disease. The thrombocytopenia is likely due to her liver disease. She is not symptomatic.  Recommend close observation only

## 2016-06-24 NOTE — Assessment & Plan Note (Signed)
She has leg pain, neuropathy, bone pain, restless legs and other symptom of musculoskeletal discomfort. I recommend vitamin D and calcium supplements. We discussed trial of gabapentin which she tolerated well and has given her significant symptomatic improvement of her neuropathy and bone pain We discussed potential modification of dosing based on her symptoms; I plan to increase gabapentin to 600 mg twice a day I will continue to reassess in her next visit I refill her prescription pain medicine but I am hopeful that we might be able to get her off pain medication in the future with gabapentin alone

## 2016-06-24 NOTE — Assessment & Plan Note (Signed)
We reviewed imaging study CT scan show no evidence of disease progression Her tumor marker fluctuated up and down but overall, she has responded well to tamoxifen I recommend we continue treatment without modification I do not plan to repeat imaging study at least for another 6 months, due in October 2018

## 2016-06-24 NOTE — Telephone Encounter (Signed)
Gave patient AVS and calender per 5/14 los

## 2016-06-25 LAB — CA 125: Cancer Antigen (CA) 125: 47.3 U/mL — ABNORMAL HIGH (ref 0.0–38.1)

## 2016-06-27 ENCOUNTER — Telehealth: Payer: Self-pay

## 2016-06-27 NOTE — Telephone Encounter (Signed)
You may release results to her Number is a bit elevated by she just had CT in April. We will watch it one more time when I see her in June and if it trends up again will repeat CT in July

## 2016-06-27 NOTE — Telephone Encounter (Signed)
S/w pt per Dr Alvy Bimler attached message.

## 2016-06-27 NOTE — Telephone Encounter (Signed)
Pt called for CA 125 results on 06/24/16. It has not been released to [facility]. This RN noted it has increased to 47.3. Next appt 6/25.

## 2016-07-24 NOTE — Progress Notes (Signed)
Olivia Benson was seen today in neurologic consultation at the request of Dr. Marko Plume. Her PCP is Helane Rima, MD.  The consultation is for RLS.  Prior records were reviewed.  The patient also has a history of primary peritoneal carcinomatosis, grade 3.  She has undergone previous debulking surgery and chemotherapy with carboplatin and Taxol, which was last completed on 05/30/2014.  Her chemotherapy has been complicated by neutropenia, peripheral neuropathy, symptomatically anemia and fatigue.  She presents today to discuss restless leg syndrome.  Pt states that her sx's consist of a crawling sensation on the legs; she feels that she has a spider web on the leg; "I just have to move."  It may have started pre-chemo but got much worse after chemo.  It starts mid afternoon once she sits and "gets quiet" and tries to watch TV.    She has previously tried flexeril, baclofen, ultram, restoril and oxycontin.  The only one that has been helpful is oxyIR.  She has never been on requip, mirapex, levodopa, klonopin.    She admits that she craves ice.  She has been anemic.  Her last Hgb was 10.6.    11/25/14 update:  The patient is following up today in regards to her restless leg syndrome that is likely secondary to iron deficiency anemia.  Last visit, I started her on pramipexole, 0.125 mg after dinner and 0.25 mg at night. When she wakes up at night, she has "bone pain" but no RLS sx's.  She is still on one oxycodone at night for the pain but has dropped that from 2 tablets.   I was able to get a copy of her TSH from January, 2015 and it was normal at 1.820.  Dr. Marko Plume did start her on oral iron after our last visit but unfortunately the patient did not tolerate that because of nausea and it was recommended that the patient try instead a multivitamin with iron and she couldn't tolerate that either.  Said that she actually tolerated oral iron better than MVI.  Said that her ice craving went away.  05/24/15  update:  The patient follows up today in regards to restless leg syndrome.  She is on pramipexole, 0.125 mg after dinner and 0.25 mg at night.  She has been worse but isn't sure if that is because she is no longer on her oxycontin.  Admits she isn't taking her iron supplements.   She isn't sleeping at all now.  Eating ice again.  She did get married since our last visit, but reports that she has been with the same gentleman for a long time.  States that her weight and BS have both been going up and she is frustrated with that.  She has had some arthritic pain and the indocin helps but she had some stomach pain.  07/28/15 update:  The patient is seen today in follow-up for restless leg syndrome, which has been secondary to iron deficiency.  Last visit, she was complaining about worsening symptoms and increased her pramipexole 0.5 mg, so that she was taking a half a tablet after dinner and one tablet at night.  She complains that this medication makes her cough and causes SOB so she only takes 1/2 after dinner and 1/2 at night. Last visit, I  also checked her iron, and the percent saturation was low at 7% and total iron was low at 34.  I told her that she absolutely needed to restart her iron supplements.  She  states that she is taking this and "it isn't helping and my cravings for ice is increasing."  Admits that she overall isn't feeling good.  "I have pain all over."  No taking diabetes meds as prescribed.    11/27/15 update:  Pt f/u today for RLS.  She thought that mirapex caused SOB and cough and although that would be very unusual, we d/c that last visit and changed it to neupro patch.  I thought that her iron was likely low last visit and I rechecked it, and it indeed was low.  Through Dr. Marko Plume, IV iron was arranged for the patient.  She felt that she did amazing after the 2nd infusion of iron.  She is now off of the iron.  She is also off of the patch because she is in the donut hole.  She quit the IV  iron because she started chemo.  She is starting to crave ice again but not like she was last visit and not SOB like she was before last visit. The records that were made available to me were reviewed.  Unfortunately, pt had recurrence of her primary peritoneal carcinomatosis since our last visit.  Her chemo was restarted.  04/02/16 update: Patient seen today in follow-up.  She was given samples of the Neupro patch last visit, 1 mg for her restless leg.  She had repeat ferritin and iron studies on 02/19/2016 and were unremarkable.  States that she really was doing well until started on tamoxifen and then the RLS started acting up.  She is having cramping of the legs as well and has been taking a significant amount of advil (8-12 per day).  07/31/16 update:  Patient in today in follow-up.  Her Neupro patch was increased last visit her restless leg to 2 mg daily.  She states today that she didn't think that it was helping but she forgot it one day and then "I paced the floors and cried."  Has been f/u with Dr. Alvy Bimler and I reviewed those records.  No tumor progression.  Tumors markers fluctuated somewhat.  On tamoxifen.  She did get rid of her advil but is taking hydrocodone.  She is still craving ice but cannot tolerate the iron supplements so isn't taking supplements   PREVIOUS MEDICATIONS: n/a  ALLERGIES:   Allergies  Allergen Reactions  . Shellfish Allergy Anaphylaxis    HER THROAT SWELLS UP AND CLOSES  . Benadryl [Diphenhydramine Hcl] Other (See Comments)    Restless legs   . Penicillins Rash    WHEN SHE WAS TEENAGER  . Tape Itching    Red itch with some tapes    CURRENT MEDICATIONS:  Outpatient Encounter Prescriptions as of 07/31/2016  Medication Sig  . amLODipine (NORVASC) 5 MG tablet Take 5 mg by mouth daily.  . bisoprolol (ZEBETA) 5 MG tablet   . gabapentin (NEURONTIN) 300 MG capsule take 1 capsule by mouth three times a day  . HYDROcodone-acetaminophen (NORCO/VICODIN) 5-325 MG  tablet take 1 tablet by mouth every 6 hours if needed for MODERATE PAIN  . insulin degludec (TRESIBA) 100 UNIT/ML SOPN FlexTouch Pen Inject 11 Units into the skin daily.   Marland Kitchen lidocaine-prilocaine (EMLA) cream Apply to Porta-cath  1-2 hours prior to access as directed.  . loratadine (CLARITIN) 10 MG tablet Take 10 mg by mouth daily as needed for allergies.  . metFORMIN (GLUCOPHAGE-XR) 500 MG 24 hr tablet Take 1,000 mg by mouth 2 (two) times daily.   . ondansetron (ZOFRAN) 8  MG tablet Take 1 tablet (8 mg total) by mouth every 8 (eight) hours as needed for nausea or vomiting.  . pantoprazole (PROTONIX) 40 MG tablet Take 40 mg by mouth daily.   . rotigotine (NEUPRO) 2 MG/24HR Place 1 patch onto the skin daily.  . tamoxifen (NOLVADEX) 20 MG tablet Take 1 tablet (20 mg total) by mouth daily.   No facility-administered encounter medications on file as of 07/31/2016.     PAST MEDICAL HISTORY:   Past Medical History:  Diagnosis Date  . Asthma   . Cirrhosis ([place])   . Complication of anesthesia    slow to wake up / n & v  . Diabetes mellitus   . Elevated LFTs   . Family history of adverse reaction to anesthesia    multiple family members have had problems such as slow to wake / "flat lined" /  ventilator    . Frequency of urination   . Glaucoma, narrow-angle   . History of skin cancer   . Hypertension   . Non-ST elevated myocardial infarction (non-STEMI) [facility]) Sept 2012   Mild elevation in troponin and CK MB but no evidence of coronary disease at time of cath  . Obesity   . 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 Overactive bladder   . PONV (postoperative nausea and vomiting)   . Portal hypertension ([place])   . Restless leg syndrome   . Tingling    hands and feet    PAST SURGICAL HISTORY:   Past Surgical History:  Procedure Laterality Date  . ABDOMINAL ULTRASOUND  Sept 2012   No gallbladder disease, + fatty liver  . CARDIAC CATHETERIZATION  10/25/2010   EF 60%;  Normal coronaries  . COLONOSCOPY  10/18/2005  . COSMETIC SURGERY  2003  . DENTAL SURGERY    . EYE SURGERY Bilateral 09/19/14 R eye, 08/2014 L eye   open angles in both eyes d/t narrow angle glaucoma  . LAPAROTOMY N/A 03/29/2014   Procedure: EXPLORATORY LAPAROTOMY;  Surgeon: Everitt Amber, MD;  Location: [facility];  Service: Gynecology;  Laterality: N/A;  . PARTIAL HYSTERECTOMY  1988  . SALPINGOOPHORECTOMY Bilateral 03/29/2014   Procedure: BILATERAL SALPINGO OOPHORECTOMY, OMENTECTOMY, DEBULKING;  Surgeon: Everitt Amber, MD;  Location: [facility];  Service: Gynecology;  Laterality: Bilateral;  . TONSILLECTOMY  1971  . TUBAL LIGATION    . [facility]  11/24/2009    SOCIAL HISTORY:   Social History   Social History  . Marital status: Married    Spouse name: N/A  . Number of children: 1 D  . Years of education: N/A   Occupational History  . Retired    Social History Main Topics  . Smoking status: Former Smoker    Packs/day: 1.00    Years: 1.00    Types: Cigarettes    Quit date: 02/11/1982  . Smokeless tobacco: Never Used  . Alcohol use No  . Drug use: No  . Sexual activity: Not on file   Other Topics Concern  . Not on file   Social History Narrative  . No narrative on file    FAMILY HISTORY:   Family Status  Relation Status  . Mother Alive       arthritis, htn, DM  . Father Deceased at age 73       bladder cancer, stroke  . Sister Alive       breast cancer  . Sister Alive       htn  .  Sister Alive       htn  . Sister Alive       htn  . Mat [employer]  . Cousin Alive  . Mat Uncle Deceased at age infancy  . MGM Deceased at age 32       pneumonia  . MGF Deceased  . PGM Deceased  . PGF Deceased  . Mat [employer]  . Mat [employer]  . Mat [employer]  . Son Deceased at age 16       died of brain anerysm, after a 20 ft fall  . Brother Scientist, forensic  . Brother (Not Specified)    ROS:  A complete 10 system review of systems was obtained and was unremarkable apart from  what is mentioned above.  PHYSICAL EXAMINATION:    VITALS:   There were no vitals filed for this visit.  GEN:  Normal appears female in no acute distress.  Appears stated age. HEENT:  Normocephalic, atraumatic. The mucous membranes are moist. The superficial temporal arteries are without ropiness or tenderness. Cardiovascular: Regular rate and rhythm. Lungs: Clear to auscultation bilaterally. Neck/Heme: There are no carotid bruits noted bilaterally.  NEUROLOGICAL: Orientation:  The patient is alert and oriented x 3.   Cranial nerves: There is good facial symmetry.  Speech is fluent and clear. Soft palate rises symmetrically and there is no tongue deviation. Hearing is intact to conversational tone. Tone: Tone is good throughout. Sensation: Sensation is intact to light touch throughout Coordination:  The patient has no difficulty with RAM's or FNF bilaterally. Motor: Strength is 5/5 in the bilateral upper and and at least 5-/5 with give way weakness in the LE.  Shoulder shrug is equal and symmetric. There is no pronator drift.  There are no fasciculations noted. Gait and Station: The patient is able to ambulate without difficulty.   Lab Results  Component Value Date   WBC 4.3 06/24/2016   HGB 10.8 (L) 06/24/2016   HCT 33.8 (L) 06/24/2016   MCV 86.2 06/24/2016   PLT 116 (L) 06/24/2016   Lab Results  Component Value Date   TSH 1.119 10/13/2014   Lab Results  Component Value Date   IRON 61 02/19/2016   TIBC 385 02/19/2016   FERRITIN 102 02/19/2016     Chemistry      Component Value Date/Time   NA 142 06/24/2016 0816   K 4.1 06/24/2016 0816   CL 105 03/30/2014 0618   CO2 24 06/24/2016 0816   BUN 14.8 06/24/2016 0816   CREATININE 0.9 06/24/2016 0816      Component Value Date/Time   CALCIUM 8.6 06/24/2016 0816   ALKPHOS 138 06/24/2016 0816   AST 52 (H) 06/24/2016 0816   ALT 67 (H) 06/24/2016 0816   BILITOT 0.46 06/24/2016 0816        IMPRESSION/PLAN  1. RLS,  secondary to iron def anemia  -she quit taking iron supplements so likely part of the issue.  Talked about this today.  -Increase neupro to 4 mg.   Risks, benefits, side effects and alternative therapies were discussed.  The opportunity to ask questions was given and they were answered to the best of my ability.  The patient expressed understanding and willingness to follow the outlined treatment protocols.  2.  LE cramping  -she couldn't tolerate tonic water.  On hydrocodone now.  3.  F/u 4 months, sooner should new issues arise.  Much greater than 50% of this  visit was spent in counseling and coordinating care.  Total face to face time:  25 min

## 2016-07-31 ENCOUNTER — Ambulatory Visit (INDEPENDENT_AMBULATORY_CARE_PROVIDER_SITE_OTHER): Payer: Medicare Other | Admitting: Neurology

## 2016-07-31 ENCOUNTER — Encounter: Payer: Self-pay | Admitting: Neurology

## 2016-07-31 VITALS — BP 134/70 | HR 70 | Ht 61.5 in | Wt 197.0 lb

## 2016-07-31 DIAGNOSIS — E611 Iron deficiency: Secondary | ICD-10-CM | POA: Diagnosis not present

## 2016-07-31 DIAGNOSIS — G2581 Restless legs syndrome: Secondary | ICD-10-CM

## 2016-07-31 MED ORDER — ROTIGOTINE 4 MG/24HR TD PT24: 1.0000 | MEDICATED_PATCH | Freq: Every day | TRANSDERMAL | 5 refills | Status: DC

## 2016-08-02 ENCOUNTER — Other Ambulatory Visit: Payer: Self-pay | Admitting: *Deleted

## 2016-08-02 DIAGNOSIS — C482 Malignant neoplasm of peritoneum, unspecified: Secondary | ICD-10-CM

## 2016-08-03 ENCOUNTER — Other Ambulatory Visit: Payer: Self-pay | Admitting: Hematology and Oncology

## 2016-08-05 ENCOUNTER — Other Ambulatory Visit (HOSPITAL_BASED_OUTPATIENT_CLINIC_OR_DEPARTMENT_OTHER): Payer: Medicare Other

## 2016-08-05 ENCOUNTER — Telehealth: Payer: Self-pay | Admitting: Hematology and Oncology

## 2016-08-05 ENCOUNTER — Encounter: Payer: Self-pay | Admitting: Hematology and Oncology

## 2016-08-05 ENCOUNTER — Ambulatory Visit (HOSPITAL_BASED_OUTPATIENT_CLINIC_OR_DEPARTMENT_OTHER): Payer: Medicare Other | Admitting: Hematology and Oncology

## 2016-08-05 ENCOUNTER — Ambulatory Visit: Payer: Medicare Other

## 2016-08-05 ENCOUNTER — Other Ambulatory Visit: Payer: Self-pay | Admitting: Hematology and Oncology

## 2016-08-05 DIAGNOSIS — D509 Iron deficiency anemia, unspecified: Secondary | ICD-10-CM

## 2016-08-05 DIAGNOSIS — C482 Malignant neoplasm of peritoneum, unspecified: Secondary | ICD-10-CM

## 2016-08-05 DIAGNOSIS — D61818 Other pancytopenia: Secondary | ICD-10-CM | POA: Diagnosis not present

## 2016-08-05 DIAGNOSIS — E1165 Type 2 diabetes mellitus with hyperglycemia: Secondary | ICD-10-CM | POA: Diagnosis not present

## 2016-08-05 DIAGNOSIS — G2581 Restless legs syndrome: Secondary | ICD-10-CM | POA: Diagnosis not present

## 2016-08-05 DIAGNOSIS — G620 Drug-induced polyneuropathy: Secondary | ICD-10-CM | POA: Diagnosis not present

## 2016-08-05 DIAGNOSIS — T451X5A Adverse effect of antineoplastic and immunosuppressive drugs, initial encounter: Secondary | ICD-10-CM

## 2016-08-05 LAB — CBC WITH DIFFERENTIAL/PLATELET
BASO%: 0.4 % (ref 0.0–2.0)
Basophils Absolute: 0 10*3/uL (ref 0.0–0.1)
EOS%: 1.9 % (ref 0.0–7.0)
Eosinophils Absolute: 0.1 10*3/uL (ref 0.0–0.5)
HCT: 34.1 % — ABNORMAL LOW (ref 34.8–46.6)
HGB: 10.9 g/dL — ABNORMAL LOW (ref 11.6–15.9)
LYMPH%: 28.5 % (ref 14.0–49.7)
MCH: 26.2 pg (ref 25.1–34.0)
MCHC: 31.9 g/dL (ref 31.5–36.0)
MCV: 82.1 fL (ref 79.5–101.0)
MONO#: 0.2 10*3/uL (ref 0.1–0.9)
MONO%: 4.8 % (ref 0.0–14.0)
NEUT#: 3.1 10*3/uL (ref 1.5–6.5)
NEUT%: 64.4 % (ref 38.4–76.8)
Platelets: 120 10*3/uL — ABNORMAL LOW (ref 145–400)
RBC: 4.16 10*6/uL (ref 3.70–5.45)
RDW: 16.6 % — ABNORMAL HIGH (ref 11.2–14.5)
WBC: 4.9 10*3/uL (ref 3.9–10.3)
lymph#: 1.4 10*3/uL (ref 0.9–3.3)

## 2016-08-05 LAB — COMPREHENSIVE METABOLIC PANEL
ALT: 72 U/L — ABNORMAL HIGH (ref 0–55)
AST: 59 U/L — ABNORMAL HIGH (ref 5–34)
Albumin: 3.5 g/dL (ref 3.5–5.0)
Alkaline Phosphatase: 130 U/L (ref 40–150)
Anion Gap: 11 mEq/L (ref 3–11)
BUN: 13.7 mg/dL (ref 7.0–26.0)
CO2: 23 mEq/L (ref 22–29)
Calcium: 8.8 mg/dL (ref 8.4–10.4)
Chloride: 108 mEq/L (ref 98–109)
Creatinine: 0.9 mg/dL (ref 0.6–1.1)
EGFR: 68 mL/min/{1.73_m2} — ABNORMAL LOW (ref >90)
Glucose: 153 mg/dl — ABNORMAL HIGH (ref 70–140)
Potassium: 3.9 mEq/L (ref 3.5–5.1)
Sodium: 143 mEq/L (ref 136–145)
Total Bilirubin: 0.51 mg/dL (ref 0.20–1.20)
Total Protein: 7.1 g/dL (ref 6.4–8.3)

## 2016-08-05 LAB — IRON AND TIBC
%SAT: 9 % — ABNORMAL LOW (ref 21–57)
Iron: 42 ug/dL (ref 41–142)
TIBC: 464 ug/dL — ABNORMAL HIGH (ref 236–444)
UIBC: 422 ug/dL — ABNORMAL HIGH (ref 120–384)

## 2016-08-05 LAB — FERRITIN: Ferritin: 16 ng/ml (ref 9–269)

## 2016-08-05 MED ORDER — HYDROCODONE-ACETAMINOPHEN 5-325 MG PO TABS: ORAL_TABLET | ORAL | 0 refills | Status: DC

## 2016-08-05 MED ORDER — HEPARIN SOD (PORK) LOCK FLUSH 100 UNIT/ML IV SOLN
500.0000 [IU] | Freq: Once | INTRAVENOUS | Status: AC
  Administered 2016-08-05: 500 [IU]
  Filled 2016-08-05: qty 5

## 2016-08-05 MED ORDER — SODIUM CHLORIDE 0.9 % IJ SOLN
10.0000 mL | Freq: Once | INTRAMUSCULAR | Status: AC
  Administered 2016-08-05: 10 mL
  Filled 2016-08-05: qty 10

## 2016-08-05 MED ORDER — GABAPENTIN 300 MG PO CAPS: 600.0000 mg | ORAL_CAPSULE | Freq: Two times a day (BID) | ORAL | 3 refills | Status: DC

## 2016-08-05 NOTE — Assessment & Plan Note (Signed)
She has mild anemia likely anemia chronic disease. The thrombocytopenia is likely due to her liver disease. She is not symptomatic.  Recommend close observation only

## 2016-08-05 NOTE — Assessment & Plan Note (Signed)
She has leg pain, neuropathy, bone pain, restless legs and other symptom of musculoskeletal discomfort. I recommend vitamin D and calcium supplements. We discussed trial of gabapentin which she tolerated well and has given her significant symptomatic improvement of her neuropathy and bone pain Recently, I increased gabapentin to 600 mg twice a day She found that very helpful without major side effects I recommend we continue the same dose and I refill her prescription I will continue to reassess in her next visit I refill her prescription pain medicine but I am hopeful that we might be able to get her off pain medication in the future with gabapentin alone

## 2016-08-05 NOTE — Assessment & Plan Note (Signed)
She has recurrent iron deficiency anemia, likely due to diverticulosis and hemorrhoidal bleeding We will proceed with Intravenous iron as discussed We will consider referral back to GI for evaluation if her iron deficiency anemia persists

## 2016-08-05 NOTE — Assessment & Plan Note (Signed)
We reviewed imaging study CT scan in April 2018 did not show evidence of disease progression Her tumor marker fluctuated up and down but overall, she has responded well to tamoxifen I recommend we continue treatment without modification I do not plan to repeat imaging study at least for another 6 months, due in October 2018, unless if tumor marker elevation is drastic

## 2016-08-05 NOTE — Progress Notes (Signed)
[facility] OFFICE PROGRESS NOTE  Patient Care Team: Helane Rima, MD as PCP - General (Family Medicine)  SUMMARY OF ONCOLOGIC HISTORY:   Primary peritoneal carcinomatosis ([place])   09/06/2011 Imaging    CT findings consistent with cirrhotic changes involving the liver.  No worrisome liver mass.  There are associated portal venous collaterals and splenomegaly consistent with portal venous hypertension. 2.  No other significant upper abdominal findings      11/08/2013 Imaging    US showed ascites      11/08/2013 Initial Diagnosis    Patient had ~ 2 months of some urinary incontinence, saw Dr Ronita Hipps in 10-2013, with pelvic mass on exam       11/12/2013 Imaging    There are findings of progressive hepatic cirrhosis with a large amount of ascites and mild splenomegaly. There are venous collaterals present. 2. There is abnormal thickening of the peritoneal surface along the left mid and lower abdominal wall. There is abnormal soft tissue density in the pelvis as well which could reflect the clinically suspected ovarian malignancy but the findings are nondiagnostic. 3. There is a new small left pleural effusion. 4. There is no acute bowel abnormality.      11/18/2013 Imaging    Successful ultrasound guided diagnostic and therapeutic paracentesis yielding 4.1 liters of ascites.       11/18/2013 Pathology Results    PERITONEAL/ASCITIC FLUID (SPECIMEN 1 OF 1 COLLECTED 11/18/13): SEROUS CARCINOMA, PLEASE SEE COMMENT.      12/01/2013 Tumor Marker    Patient's tumor was tested for the following markers: CA125 Results of the tumor marker test revealed 1938      12/03/2013 Imaging    Successful ultrasound-guided therapeutic paracentesis yielding 3.6 liters of peritoneal fluid.      12/07/2013 - 05/30/2014 Chemotherapy    She received 3 cycles of carboplatin and Taxol, interrupted cycle 4 for surgery.  She subsequently completed 3 more cycles of chemotherapy after surgery      12/20/2013 Imaging    Successful ultrasound-guided diagnostic and therapeutic paracentesis yielding 2.2 liters of peritoneal fluid      12/20/2013 Pathology Results    PERITONEAL/ASCITIC FLUID(SPECIMEN 1 OF 1 COLLECTED 12/20/13): MALIGNANT CELLS CONSISTENT WITH SEROUS CARCINOMA      01/13/2014 Tumor Marker    Patient's tumor was tested for the following markers: CA125 Results of the tumor marker test revealed 227      02/07/2014 Tumor Marker    Patient's tumor was tested for the following markers: CA125 Results of the tumor marker test revealed 130      02/15/2014 Genetic Testing    Genetics testing from 02-2014 normal ([employer] panel)      02/23/2014 Imaging    Interval decrease in ascites. 2. Morphologic changes in the liver consistent with cirrhosis. 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 Portal vein remains patent at this time. 3. Persistent but decreased abnormal soft tissue attenuation tracking in the omental fat. This may be secondary to interval improvement in metastatic disease. 4. Peritoneal thickening seen in the left abdomen and pelvis on the previous study has decreased and nearly resolved in the interval. This also suggests interval improvement in metastatic disease.       03/07/2014 Procedure    Ultrasound and fluoroscopically guided right internal jugular single lumen power port catheter insertion. Tip in the SVC/RA junction. Catheter ready for use.      03/17/2014 Tumor Marker    Patient's tumor was tested for the following  markers: CA125 Results of the tumor marker test revealed 45      03/29/2014 Pathology Results    1. Omentum, resection for tumor - HIGH GRADE SEROUS CARCINOMA, SEE COMMENT. 2. Ovary and fallopian tube, right - HIGH GRADE SEROUS CARCINOMA, SEE COMMENT. 3. Ovary and fallopian tube, left - HIGH GRADE SEROUS CARCINOMA, SEE COMMENT. Diagnosis Note 1. Nests and clusters of malignant cells are invading the omental  tissue (part #1) with associated fibrosis. The cells are pleomorphic with prominent nucleoli. There are scattered psammoma bodies. The ovaries are atrophic and exhibit multiple foci of surface based invasive carcinoma and associated fibrosis. The fallopian tubes have a few foci of carcinoma, also superficially located. There are no precursor lesions noted in the ovaries or fallopian tubes (the entire tubes and ovaries were submitted for evaluation). While there is some retraction artifact, there are several foci suspicious for lymphovascular invasion. 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 Given the fibrosis, there does appear to be a small amount of treatment effect, however, there is abundant residual tumor.       03/29/2014 Surgery    Procedure(s) Performed: 1. Exploratory laparotomy with bilateral salpingo-oophorectomy, omentectomy radical tumor debulking for ovarian cancer .  Surgeon: Thereasa Solo, MD.  Assistant Surgeon: Lahoma Crocker, M.D. Assistant: (an MD assistant was necessary for tissue manipulation, retraction and positioning due to the complexity of the case and hospital policies).  Operative Findings: 10cm omental cake from hepatic to splenic flexure, densely adherent to transverse colon. Milliary studding of tumor implants (<31m, too numerous in number to count) adherent to the mesentery of the small bowel and small bowel wall. Small volume (100cc) ascites. Small ovaries bilaterally, densely adherent to the pelvic cul de sac. Left ovary and tube densely adherent to sigmoid colon. Cirrhotic liver with hepatomegaly and splenomegaly.     This represented an optimal cytoreduction (R1) with visible disease residual on the bowel wall and mesentery (millial, <370mimplants).        04/18/2014 Tumor Marker    Patient's tumor was tested for the  following markers: CA125 Results of the tumor marker test revealed 122      05/16/2014 Tumor Marker    Patient's tumor was tested for the following markers: CA125 Results of the tumor marker test revealed 30      06/10/2014 Tumor Marker    Patient's tumor was tested for the following markers: CA125 Results of the tumor marker test revealed 19      07/04/2014 Imaging    Interval improvement in the appearance of peritoneal metastasis secondary to ovarian cancer. 2. 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       08/18/2014 Tumor Marker    Patient's tumor was tested for the following markers: CA125 Results of the tumor marker test revealed 16      10/13/2014 Tumor Marker    Patient's tumor was tested for the following markers: CA125 Results of the tumor marker test revealed 14      11/21/2014 Tumor Marker    Patient's tumor was tested for the following markers: CA125 Results of the tumor marker test revealed 16      12/28/2014 Tumor Marker    Patient's tumor was tested for the following markers: CA125 Results of the tumor marker test revealed 762      02/27/2015 Tumor Marker    Patient's  tumor was tested for the following markers: CA125 Results of the tumor marker test revealed 20.2      03/22/2015 Imaging    Filler appearance to the prior exam, with very subtle fluid tracking along portions of the liver, and very minimal nodularity along the right paracolic gutter representing residua from the prior peritoneal cancer. The current abnormalities could simply be post therapy findings rather than necessarily representing residual malignancy. No new or enlarging lesions are identified. 2. Hepatic cirrhosis and splenomegaly. There is some gastric varices suggesting portal venous hypertension. 3. Left foraminal stenosis at L4-5 due to spurring. There is likely also some central narrowing of the thecal sac at this level. 4. Bibasilar scarring. 5. Chronic mild left mid  kidney scarring      03/27/2015 Tumor Marker    Patient's tumor was tested for the following markers: CA125 Results of the tumor marker test revealed 25.3      04/24/2015 Imaging    Disc bulge L2-3 with mild spinal stenosis and right foraminal encroachment. 2. Disc bulge L3-4 with mild spinal stenosis. 3. Multifactorial spinal, left lateral recess and foraminal stenosis L4-5. There is grade 1 anterolisthesis without evident dynamic instability.      05/29/2015 Tumor Marker    Patient's tumor was tested for the following markers: CA125 Results of the tumor marker test revealed 29.9      08/21/2015 Tumor Marker    Patient's tumor was tested for the following markers: CA125 Results of the tumor marker test revealed 45      09/18/2015 Tumor Marker    Patient's tumor was tested for the following markers: CA125 Results of the tumor marker test revealed 47      09/27/2015 Imaging    New small bowel mesenteric nodule and enlarged left external iliac lymph node, highly worrisome for metastatic disease. 2. Cirrhosis and splenomegaly      10/10/2015 Tumor Marker    Patient's tumor was tested for the following markers: CA125 Results of the tumor marker test revealed 53.4      10/10/2015 - 12/11/2015 Chemotherapy    She received 4 cycles of carboplatin only      11/20/2015 Tumor Marker    Patient's tumor was tested for the following markers: CA125 Results of the tumor marker test revealed 41.3      12/20/2015 Imaging    CT: Mild left external iliac lymphadenopathy is slightly decreased. Mildly enlarged lower mesenteric nodule is slightly decreased. 2. No new or progressive metastatic disease in the abdomen or pelvis. 3. Cirrhosis. Stable subcentimeter hypodense left liver lobe lesion. No new liver lesions. 4. Stable mild splenomegaly.  No ascites. 5. Mild sigmoid diverticulosis.      01/01/2016 -  Anti-estrogen oral therapy    She was placed on tamoxifen. Had letrozole initially but  switched to tamoxifen due to poor tolerance      01/15/2016 Tumor Marker    Patient's tumor was tested for the following markers: CA125 Results of the tumor marker test revealed 31.4      02/19/2016 Tumor Marker    Patient's tumor was tested for the following markers: CA125 Results of the tumor marker test revealed 36.5      05/13/2016 Imaging    No evidence of disease progression within the abdomen or pelvis. Previously noted small central mesenteric nodule and left external iliac node appear slightly smaller. 2. Stable changes of hepatic cirrhosis and portal hypertension with associated splenomegaly. No new or enlarging hepatic lesions are identified. 3. No  acute findings.      05/13/2016 Tumor Marker    Patient's tumor was tested for the following markers: CA125 Results of the tumor marker test revealed 41.2      06/24/2016 Tumor Marker    Patient's tumor was tested for the following markers: CA125 Results of the tumor marker test revealed 47.3       INTERVAL HISTORY: Please see below for problem oriented charting. She returns with her husband for further follow-up She complain of significant leg cramps and craving for ice The patient denies any recent signs or symptoms of bleeding such as spontaneous epistaxis, hematuria or hematochezia. From the cancer standpoint, she denies abdominal bloating or changes in bowel habits Gabapentin is helping with her neuropathy.  She had no side effects from gabapentin such as sedation or constipation Her diabetes remained poorly controlled and she has appointment to see her endocrinologist for medical management She denies recent infection  REVIEW OF SYSTEMS:   Constitutional: Denies fevers, chills or abnormal weight loss Eyes: Denies blurriness of vision Ears, nose, mouth, throat, and face: Denies mucositis or sore throat Respiratory: Denies cough, dyspnea or wheezes Cardiovascular: Denies palpitation, chest discomfort or lower extremity  swelling Gastrointestinal:  Denies nausea, heartburn or change in bowel habits Skin: Denies abnormal skin rashes Lymphatics: Denies new lymphadenopathy or easy bruising Neurological:Denies numbness, tingling or new weaknesses Behavioral/Psych: Mood is stable, no new changes  All other systems were reviewed with the patient and are negative.  I have reviewed the past medical history, past surgical history, social history and family history with the patient and they are unchanged from previous note.  ALLERGIES:  is allergic to shellfish allergy; benadryl [diphenhydramine hcl]; penicillins; and tape.  MEDICATIONS:  Current Outpatient Prescriptions  Medication Sig Dispense Refill  . amLODipine (NORVASC) 5 MG tablet Take 5 mg by mouth daily.  0  . aspirin EC 81 MG tablet Take 81 mg by mouth daily.    . bisoprolol (ZEBETA) 5 MG tablet   0  . cholecalciferol (VITAMIN D) 1000 units tablet Take 1,000 Units by mouth daily.    Marland Kitchen gabapentin (NEURONTIN) 300 MG capsule Take 2 capsules (600 mg total) by mouth 2 (two) times daily. 90 capsule 3  . HYDROcodone-acetaminophen (NORCO/VICODIN) 5-325 MG tablet take 1 tablet by mouth every 6 hours if needed for MODERATE PAIN 60 tablet 0  . insulin degludec (TRESIBA) 100 UNIT/ML SOPN FlexTouch Pen Inject 13 Units into the skin daily.     Marland Kitchen lidocaine-prilocaine (EMLA) cream Apply to Porta-cath  1-2 hours prior to access as directed. 30 g 1  . metFORMIN (GLUCOPHAGE-XR) 500 MG 24 hr tablet Take 1,000 mg by mouth 2 (two) times daily.   0  . pantoprazole (PROTONIX) 40 MG tablet Take 40 mg by mouth daily.   0  . rotigotine (NEUPRO) 4 MG/24HR Place 1 patch onto the skin daily. 30 patch 5  . tamoxifen (NOLVADEX) 20 MG tablet Take 1 tablet (20 mg total) by mouth daily. 90 tablet 9   No current facility-administered medications for this visit.     PHYSICAL EXAMINATION: ECOG PERFORMANCE STATUS: 1 - Symptomatic but completely ambulatory  Vitals:   08/05/16 1053  BP:  (!) 127/53  Pulse: 66  Resp: 18  Temp: 98.4 F (36.9 C)   Filed Weights   08/05/16 1053  Weight: 193 lb 6.4 oz (87.7 kg)    GENERAL:alert, no distress and comfortable.  She is morbidly obese SKIN: skin color, texture, turgor are normal, no  rashes or significant lesions EYES: normal, Conjunctiva are pink and non-injected, sclera clear OROPHARYNX:no exudate, no erythema and lips, buccal mucosa, and tongue normal  NECK: supple, thyroid normal size, non-tender, without nodularity LYMPH:  no palpable lymphadenopathy in the cervical, axillary or inguinal LUNGS: clear to auscultation and percussion with normal breathing effort HEART: regular rate & rhythm and no murmurs and no lower extremity edema ABDOMEN:abdomen soft, non-tender and normal bowel sounds Musculoskeletal:no cyanosis of digits and no clubbing  NEURO: alert & oriented x 3 with fluent speech, no focal motor/sensory deficits  LABORATORY DATA:  I have reviewed the data as listed    Component Value Date/Time   NA 143 08/05/2016 0954   K 3.9 08/05/2016 0954   CL 105 03/30/2014 0618   CO2 23 08/05/2016 0954   GLUCOSE 153 (H) 08/05/2016 0954   BUN 13.7 08/05/2016 0954   CREATININE 0.9 08/05/2016 0954   CALCIUM 8.8 08/05/2016 0954   PROT 7.1 08/05/2016 0954   ALBUMIN 3.5 08/05/2016 0954   AST 59 (H) 08/05/2016 0954   ALT 72 (H) 08/05/2016 0954   ALKPHOS 130 08/05/2016 0954   BILITOT 0.51 08/05/2016 0954   GFRNONAA 86 (L) 03/30/2014 0618   GFRAA >90 03/30/2014 0618    No results found for: SPEP, UPEP  Lab Results  Component Value Date   WBC 4.9 08/05/2016   NEUTROABS 3.1 08/05/2016   HGB 10.9 (L) 08/05/2016   HCT 34.1 (L) 08/05/2016   MCV 82.1 08/05/2016   PLT 120 (L) 08/05/2016      Chemistry      Component Value Date/Time   NA 143 08/05/2016 0954   K 3.9 08/05/2016 0954   CL 105 03/30/2014 0618   CO2 23 08/05/2016 0954   BUN 13.7 08/05/2016 0954   CREATININE 0.9 08/05/2016 0954      Component Value  Date/Time   CALCIUM 8.8 08/05/2016 0954   ALKPHOS 130 08/05/2016 0954   AST 59 (H) 08/05/2016 0954   ALT 72 (H) 08/05/2016 0954   BILITOT 0.51 08/05/2016 0954      ASSESSMENT & PLAN:  Primary peritoneal carcinomatosis ([place]) We reviewed imaging study CT scan in April 2018 did not show evidence of disease progression Her tumor marker fluctuated up and down but overall, she has responded well to tamoxifen I recommend we continue treatment without modification I do not plan to repeat imaging study at least for another 6 months, due in October 2018, unless if tumor marker elevation is drastic  Pancytopenia, acquired ([place]) She has mild anemia likely anemia chronic disease. The thrombocytopenia is likely due to her liver disease. She is not symptomatic.  Recommend close observation only  Chemotherapy-induced neuropathy ([place]) She has leg pain, neuropathy, bone pain, restless legs and other symptom of musculoskeletal discomfort. I recommend vitamin D and calcium supplements. We discussed trial of gabapentin which she tolerated well and has given her significant symptomatic improvement of her neuropathy and bone pain Recently, I increased gabapentin to 600 mg twice a day She found that very helpful without major side effects I recommend we continue the same dose and I refill her prescription I will continue to reassess in her next visit I refill her prescription pain medicine but I am hopeful that we might be able to get her off pain medication in the future with gabapentin alone  Restless legs syndrome She follows with neurologist for this. She has received intravenous iron infusion in the past. I plan to recheck iron studies again given  her significant symptoms of leg cramps and excessive pica with chewing of ice The most likely cause of her anemia is due to chronic blood loss/malabsorption syndrome. We discussed some of the risks, benefits, and alternatives of intravenous iron  infusions. The patient is symptomatic from anemia and the iron level is critically low. She tolerated oral iron supplement poorly and desires to achieved higher levels of iron faster for adequate hematopoesis. Some of the side-effects to be expected including risks of infusion reactions, phlebitis, headaches, nausea and fatigue.  The patient is willing to proceed. Patient education material was dispensed.  Goal is to keep ferritin level greater than 100   Iron deficiency anemia She has recurrent iron deficiency anemia, likely due to diverticulosis and hemorrhoidal bleeding We will proceed with Intravenous iron as discussed We will consider referral back to GI for evaluation if her iron deficiency anemia persists   No orders of the defined types were placed in this encounter.  All questions were answered. The patient knows to call the clinic with any problems, questions or concerns. No barriers to learning was detected. I spent 20 minutes counseling the patient face to face. The total time spent in the appointment was 30 minutes and more than 50% was on counseling and review of test results     [name], MD 08/05/2016 12:16 PM

## 2016-08-05 NOTE — Telephone Encounter (Signed)
Scheduled appt per 6/25 los - Gave patient AVS and calender per los.

## 2016-08-05 NOTE — Assessment & Plan Note (Signed)
She follows with neurologist for this. She has received intravenous iron infusion in the past. I plan to recheck iron studies again given her significant symptoms of leg cramps and excessive pica with chewing of ice The most likely cause of her anemia is due to chronic blood loss/malabsorption syndrome. We discussed some of the risks, benefits, and alternatives of intravenous iron infusions. The patient is symptomatic from anemia and the iron level is critically low. She tolerated oral iron supplement poorly and desires to achieved higher levels of iron faster for adequate hematopoesis. Some of the side-effects to be expected including risks of infusion reactions, phlebitis, headaches, nausea and fatigue.  The patient is willing to proceed. Patient education material was dispensed.  Goal is to keep ferritin level greater than 100

## 2016-08-06 ENCOUNTER — Telehealth: Payer: Self-pay

## 2016-08-06 ENCOUNTER — Other Ambulatory Visit: Payer: Self-pay | Admitting: Hematology and Oncology

## 2016-08-06 DIAGNOSIS — C482 Malignant neoplasm of peritoneum, unspecified: Secondary | ICD-10-CM

## 2016-08-06 DIAGNOSIS — C569 Malignant neoplasm of unspecified ovary: Secondary | ICD-10-CM

## 2016-08-06 LAB — CA 125: Cancer Antigen (CA) 125: 74.9 U/mL — ABNORMAL HIGH (ref 0.0–38.1)

## 2016-08-06 NOTE — Telephone Encounter (Signed)
-----   Message from [name], MD sent at 08/06/2016  9:00 AM EDT ----- Regarding: abnormal labs Please let her know 1) Iron studies are low, proceed with IV iron as discussed 2) Ca-125 level has increased quite a bit. Per previous discussion, will proceed with CT scan in 2 weeks (she will get called). 3) I will add appt to see her on 7/13

## 2016-08-06 NOTE — Telephone Encounter (Signed)
Called and given below message. Verbalized understanding.

## 2016-08-13 ENCOUNTER — Ambulatory Visit (HOSPITAL_BASED_OUTPATIENT_CLINIC_OR_DEPARTMENT_OTHER): Payer: Medicare Other

## 2016-08-13 VITALS — BP 130/65 | HR 68 | Temp 98.8°F | Resp 18

## 2016-08-13 DIAGNOSIS — C569 Malignant neoplasm of unspecified ovary: Secondary | ICD-10-CM

## 2016-08-13 DIAGNOSIS — C482 Malignant neoplasm of peritoneum, unspecified: Secondary | ICD-10-CM

## 2016-08-13 DIAGNOSIS — D509 Iron deficiency anemia, unspecified: Secondary | ICD-10-CM

## 2016-08-13 MED ORDER — HEPARIN SOD (PORK) LOCK FLUSH 100 UNIT/ML IV SOLN
250.0000 [IU] | Freq: Once | INTRAVENOUS | Status: DC | PRN
  Filled 2016-08-13: qty 5

## 2016-08-13 MED ORDER — SODIUM CHLORIDE 0.9 % IV SOLN
Freq: Once | INTRAVENOUS | Status: AC
  Administered 2016-08-13: 12:00:00 via INTRAVENOUS

## 2016-08-13 MED ORDER — SODIUM CHLORIDE 0.9 % IV SOLN
510.0000 mg | Freq: Once | INTRAVENOUS | Status: AC
  Administered 2016-08-13: 510 mg via INTRAVENOUS
  Filled 2016-08-13: qty 17

## 2016-08-13 MED ORDER — SODIUM CHLORIDE 0.9% FLUSH
10.0000 mL | INTRAVENOUS | Status: DC | PRN
  Administered 2016-08-13: 10 mL
  Filled 2016-08-13: qty 10

## 2016-08-13 MED ORDER — HEPARIN SOD (PORK) LOCK FLUSH 100 UNIT/ML IV SOLN
500.0000 [IU] | Freq: Once | INTRAVENOUS | Status: AC | PRN
  Administered 2016-08-13: 500 [IU]
  Filled 2016-08-13: qty 5

## 2016-08-13 NOTE — Patient Instructions (Signed)

## 2016-08-16 ENCOUNTER — Other Ambulatory Visit: Payer: Self-pay | Admitting: Neurology

## 2016-08-20 ENCOUNTER — Encounter (HOSPITAL_COMMUNITY): Payer: Self-pay

## 2016-08-20 ENCOUNTER — Ambulatory Visit (HOSPITAL_COMMUNITY)
Admission: RE | Admit: 2016-08-20 | Discharge: 2016-08-20 | Disposition: A | Discharge status: Home / Self Care | Payer: Medicare Other | Source: Ambulatory Visit | Attending: Hematology and Oncology | Admitting: Hematology and Oncology

## 2016-08-20 DIAGNOSIS — K7460 Unspecified cirrhosis of liver: Secondary | ICD-10-CM | POA: Insufficient documentation

## 2016-08-20 DIAGNOSIS — J9811 Atelectasis: Secondary | ICD-10-CM | POA: Insufficient documentation

## 2016-08-20 DIAGNOSIS — K5730 Diverticulosis of large intestine without perforation or abscess without bleeding: Secondary | ICD-10-CM | POA: Diagnosis not present

## 2016-08-20 DIAGNOSIS — R161 Splenomegaly, not elsewhere classified: Secondary | ICD-10-CM | POA: Insufficient documentation

## 2016-08-20 DIAGNOSIS — M5136 Other intervertebral disc degeneration, lumbar region: Secondary | ICD-10-CM | POA: Insufficient documentation

## 2016-08-20 DIAGNOSIS — C569 Malignant neoplasm of unspecified ovary: Secondary | ICD-10-CM

## 2016-08-20 DIAGNOSIS — C482 Malignant neoplasm of peritoneum, unspecified: Secondary | ICD-10-CM | POA: Insufficient documentation

## 2016-08-20 MED ORDER — IOPAMIDOL (ISOVUE-300) INJECTION 61%
INTRAVENOUS | Status: AC
  Administered 2016-08-20: 30 mL via ORAL
  Filled 2016-08-20: qty 30

## 2016-08-20 MED ORDER — IOPAMIDOL (ISOVUE-300) INJECTION 61%
30.0000 mL | Freq: Once | INTRAVENOUS | Status: AC | PRN
  Administered 2016-08-20: 30 mL via ORAL

## 2016-08-20 MED ORDER — HEPARIN SOD (PORK) LOCK FLUSH 100 UNIT/ML IV SOLN
INTRAVENOUS | Status: AC
  Filled 2016-08-20: qty 5

## 2016-08-20 MED ORDER — HEPARIN SOD (PORK) LOCK FLUSH 100 UNIT/ML IV SOLN
500.0000 [IU] | Freq: Once | INTRAVENOUS | Status: AC
  Administered 2016-08-20: 500 [IU] via INTRAVENOUS

## 2016-08-20 MED ORDER — IOPAMIDOL (ISOVUE-300) INJECTION 61%
100.0000 mL | Freq: Once | INTRAVENOUS | Status: AC | PRN
  Administered 2016-08-20: 100 mL via INTRAVENOUS

## 2016-08-20 MED ORDER — IOPAMIDOL (ISOVUE-300) INJECTION 61%
INTRAVENOUS | Status: AC
  Filled 2016-08-20: qty 100

## 2016-08-23 ENCOUNTER — Ambulatory Visit (HOSPITAL_BASED_OUTPATIENT_CLINIC_OR_DEPARTMENT_OTHER): Payer: Medicare Other | Admitting: Hematology and Oncology

## 2016-08-23 ENCOUNTER — Other Ambulatory Visit (HOSPITAL_BASED_OUTPATIENT_CLINIC_OR_DEPARTMENT_OTHER): Payer: Medicare Other

## 2016-08-23 ENCOUNTER — Ambulatory Visit (HOSPITAL_BASED_OUTPATIENT_CLINIC_OR_DEPARTMENT_OTHER): Payer: Medicare Other

## 2016-08-23 ENCOUNTER — Ambulatory Visit: Payer: Medicare Other

## 2016-08-23 VITALS — BP 124/58 | HR 60 | Temp 98.6°F | Resp 17

## 2016-08-23 DIAGNOSIS — K7581 Nonalcoholic steatohepatitis (NASH): Secondary | ICD-10-CM | POA: Diagnosis not present

## 2016-08-23 DIAGNOSIS — C569 Malignant neoplasm of unspecified ovary: Secondary | ICD-10-CM

## 2016-08-23 DIAGNOSIS — G620 Drug-induced polyneuropathy: Secondary | ICD-10-CM

## 2016-08-23 DIAGNOSIS — D509 Iron deficiency anemia, unspecified: Secondary | ICD-10-CM

## 2016-08-23 DIAGNOSIS — C482 Malignant neoplasm of peritoneum, unspecified: Secondary | ICD-10-CM

## 2016-08-23 DIAGNOSIS — T451X5A Adverse effect of antineoplastic and immunosuppressive drugs, initial encounter: Secondary | ICD-10-CM

## 2016-08-23 DIAGNOSIS — K7460 Unspecified cirrhosis of liver: Secondary | ICD-10-CM

## 2016-08-23 LAB — CBC WITH DIFFERENTIAL/PLATELET
BASO%: 0.3 % (ref 0.0–2.0)
Basophils Absolute: 0 10*3/uL (ref 0.0–0.1)
EOS%: 2.1 % (ref 0.0–7.0)
Eosinophils Absolute: 0.1 10*3/uL (ref 0.0–0.5)
HCT: 35.9 % (ref 34.8–46.6)
HGB: 11.6 g/dL (ref 11.6–15.9)
LYMPH%: 30.7 % (ref 14.0–49.7)
MCH: 27.3 pg (ref 25.1–34.0)
MCHC: 32.3 g/dL (ref 31.5–36.0)
MCV: 84.4 fL (ref 79.5–101.0)
MONO#: 0.2 10*3/uL (ref 0.1–0.9)
MONO%: 4.1 % (ref 0.0–14.0)
NEUT#: 2.9 10*3/uL (ref 1.5–6.5)
NEUT%: 62.8 % (ref 38.4–76.8)
Platelets: 105 10*3/uL — ABNORMAL LOW (ref 145–400)
RBC: 4.26 10*6/uL (ref 3.70–5.45)
RDW: 18.4 % — ABNORMAL HIGH (ref 11.2–14.5)
WBC: 4.6 10*3/uL (ref 3.9–10.3)
lymph#: 1.4 10*3/uL (ref 0.9–3.3)

## 2016-08-23 LAB — COMPREHENSIVE METABOLIC PANEL
ALT: 93 U/L — ABNORMAL HIGH (ref 0–55)
AST: 63 U/L — ABNORMAL HIGH (ref 5–34)
Albumin: 3.7 g/dL (ref 3.5–5.0)
Alkaline Phosphatase: 105 U/L (ref 40–150)
Anion Gap: 12 mEq/L — ABNORMAL HIGH (ref 3–11)
BUN: 14.5 mg/dL (ref 7.0–26.0)
CO2: 24 mEq/L (ref 22–29)
Calcium: 9.3 mg/dL (ref 8.4–10.4)
Chloride: 106 mEq/L (ref 98–109)
Creatinine: 0.8 mg/dL (ref 0.6–1.1)
EGFR: 71 mL/min/{1.73_m2} — ABNORMAL LOW (ref >90)
Glucose: 140 mg/dl (ref 70–140)
Potassium: 4 mEq/L (ref 3.5–5.1)
Sodium: 142 mEq/L (ref 136–145)
Total Bilirubin: 0.58 mg/dL (ref 0.20–1.20)
Total Protein: 7.1 g/dL (ref 6.4–8.3)

## 2016-08-23 MED ORDER — HYDROCODONE-ACETAMINOPHEN 5-325 MG PO TABS: ORAL_TABLET | ORAL | 0 refills | Status: DC

## 2016-08-23 MED ORDER — SODIUM CHLORIDE 0.9 % IV SOLN
Freq: Once | INTRAVENOUS | Status: AC
  Administered 2016-08-23: 10:00:00 via INTRAVENOUS

## 2016-08-23 MED ORDER — SODIUM CHLORIDE 0.9 % IV SOLN
510.0000 mg | Freq: Once | INTRAVENOUS | Status: AC
  Administered 2016-08-23: 510 mg via INTRAVENOUS
  Filled 2016-08-23: qty 17

## 2016-08-23 MED ORDER — HEPARIN SOD (PORK) LOCK FLUSH 100 UNIT/ML IV SOLN
500.0000 [IU] | Freq: Once | INTRAVENOUS | Status: AC | PRN
  Administered 2016-08-23: 500 [IU]
  Filled 2016-08-23: qty 5

## 2016-08-23 MED ORDER — SODIUM CHLORIDE 0.9% FLUSH
10.0000 mL | INTRAVENOUS | Status: DC | PRN
  Administered 2016-08-23: 10 mL
  Filled 2016-08-23: qty 10

## 2016-08-23 MED ORDER — SODIUM CHLORIDE 0.9% FLUSH
10.0000 mL | Freq: Once | INTRAVENOUS | Status: AC
  Administered 2016-08-23: 10 mL
  Filled 2016-08-23: qty 10

## 2016-08-23 NOTE — Patient Instructions (Signed)

## 2016-08-23 NOTE — Patient Instructions (Signed)

## 2016-08-24 ENCOUNTER — Encounter: Payer: Self-pay | Admitting: Hematology and Oncology

## 2016-08-24 ENCOUNTER — Telehealth: Payer: Self-pay | Admitting: Hematology and Oncology

## 2016-08-24 LAB — CA 125: Cancer Antigen (CA) 125: 80.2 U/mL — ABNORMAL HIGH (ref 0.0–38.1)

## 2016-08-24 NOTE — Assessment & Plan Note (Addendum)
She has recurrent iron deficiency anemia, likely due to diverticulosis and hemorrhoidal bleeding We will proceed with Intravenous iron as discussed The most likely cause of her anemia is due to chronic blood loss/malabsorption syndrome. We discussed some of the risks, benefits, and alternatives of intravenous iron infusions. The patient is symptomatic from anemia and the iron level is critically low. She tolerated oral iron supplement poorly and desires to achieved higher levels of iron faster for adequate hematopoesis. Some of the side-effects to be expected including risks of infusion reactions, phlebitis, headaches, nausea and fatigue.  The patient is willing to proceed. Patient education material was dispensed.  Goal is to keep ferritin level greater than 50 We will consider referral back to GI for evaluation if her iron deficiency anemia persists

## 2016-08-24 NOTE — Assessment & Plan Note (Signed)
Her liver cirrhosis has caused abnormal liver function, splenomegaly and thrombocytopenia.  Continue to monitor carefully

## 2016-08-24 NOTE — Assessment & Plan Note (Signed)
CT scan show minimum disease change but tumor markers are rising I recommend she proceed with IV iron and plan to see her back in my office in 2 weeks to discuss test results and next plan of care In the meantime, she will continue tamoxifen

## 2016-08-24 NOTE — Telephone Encounter (Signed)
LVM TO INFORM PT OF 7/24 APPT AT 215 PER [place] [facility]

## 2016-08-24 NOTE — Progress Notes (Signed)
[facility] OFFICE PROGRESS NOTE  Patient Care Team: Helane Rima, MD as PCP - General (Family Medicine)  SUMMARY OF ONCOLOGIC HISTORY:   Primary peritoneal carcinomatosis ([place])   09/06/2011 Imaging    CT findings consistent with cirrhotic changes involving the liver.  No worrisome liver mass.  There are associated portal venous collaterals and splenomegaly consistent with portal venous hypertension. 2.  No other significant upper abdominal findings      11/08/2013 Imaging    US showed ascites      11/08/2013 Initial Diagnosis    Patient had ~ 2 months of some urinary incontinence, saw Dr Ronita Hipps in 10-2013, with pelvic mass on exam       11/12/2013 Imaging    There are findings of progressive hepatic cirrhosis with a large amount of ascites and mild splenomegaly. There are venous collaterals present. 2. There is abnormal thickening of the peritoneal surface along the left mid and lower abdominal wall. There is abnormal soft tissue density in the pelvis as well which could reflect the clinically suspected ovarian malignancy but the findings are nondiagnostic. 3. There is a new small left pleural effusion. 4. There is no acute bowel abnormality.      11/18/2013 Imaging    Successful ultrasound guided diagnostic and therapeutic paracentesis yielding 4.1 liters of ascites.       11/18/2013 Pathology Results    PERITONEAL/ASCITIC FLUID (SPECIMEN 1 OF 1 COLLECTED 11/18/13): SEROUS CARCINOMA, PLEASE SEE COMMENT.      12/01/2013 Tumor Marker    Patient's tumor was tested for the following markers: CA125 Results of the tumor marker test revealed 1938      12/03/2013 Imaging    Successful ultrasound-guided therapeutic paracentesis yielding 3.6 liters of peritoneal fluid.      12/07/2013 - 05/30/2014 Chemotherapy    She received 3 cycles of carboplatin and Taxol, interrupted cycle 4 for surgery.  She subsequently completed 3 more cycles of chemotherapy after surgery       12/20/2013 Imaging    Successful ultrasound-guided diagnostic and therapeutic paracentesis yielding 2.2 liters of peritoneal fluid      12/20/2013 Pathology Results    PERITONEAL/ASCITIC FLUID(SPECIMEN 1 OF 1 COLLECTED 12/20/13): MALIGNANT CELLS CONSISTENT WITH SEROUS CARCINOMA      01/13/2014 Tumor Marker    Patient's tumor was tested for the following markers: CA125 Results of the tumor marker test revealed 227      02/07/2014 Tumor Marker    Patient's tumor was tested for the following markers: CA125 Results of the tumor marker test revealed 130      02/15/2014 Genetic Testing    Genetics testing from 02-2014 normal ([employer] panel)      02/23/2014 Imaging    Interval decrease in ascites. 2. Morphologic changes in the liver consistent with cirrhosis. 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 Portal vein remains patent at this time. 3. Persistent but decreased abnormal soft tissue attenuation tracking in the omental fat. This may be secondary to interval improvement in metastatic disease. 4. Peritoneal thickening seen in the left abdomen and pelvis on the previous study has decreased and nearly resolved in the interval. This also suggests interval improvement in metastatic disease.       03/07/2014 Procedure    Ultrasound and fluoroscopically guided right internal jugular single lumen power port catheter insertion. Tip in the SVC/RA junction. Catheter ready for use.      03/17/2014 Tumor Marker    Patient's tumor was tested for the  following markers: CA125 Results of the tumor marker test revealed 45      03/29/2014 Pathology Results    1. Omentum, resection for tumor - HIGH GRADE SEROUS CARCINOMA, SEE COMMENT. 2. Ovary and fallopian tube, right - HIGH GRADE SEROUS CARCINOMA, SEE COMMENT. 3. Ovary and fallopian tube, left - HIGH GRADE SEROUS CARCINOMA, SEE COMMENT. Diagnosis Note 1. Nests and clusters of malignant cells are invading the omental  tissue (part #1) with associated fibrosis. The cells are pleomorphic with prominent nucleoli. There are scattered psammoma bodies. The ovaries are atrophic and exhibit multiple foci of surface based invasive carcinoma and associated fibrosis. The fallopian tubes have a few foci of carcinoma, also superficially located. There are no precursor lesions noted in the ovaries or fallopian tubes (the entire tubes and ovaries were submitted for evaluation). While there is some retraction artifact, there are several foci suspicious for lymphovascular invasion. 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 Given the fibrosis, there does appear to be a small amount of treatment effect, however, there is abundant residual tumor.       03/29/2014 Surgery    Procedure(s) Performed: 1. Exploratory laparotomy with bilateral salpingo-oophorectomy, omentectomy radical tumor debulking for ovarian cancer .  Surgeon: Thereasa Solo, MD.  Assistant Surgeon: Lahoma Crocker, M.D. Assistant: (an MD assistant was necessary for tissue manipulation, retraction and positioning due to the complexity of the case and hospital policies).  Operative Findings: 10cm omental cake from hepatic to splenic flexure, densely adherent to transverse colon. Milliary studding of tumor implants (<83m, too numerous in number to count) adherent to the mesentery of the small bowel and small bowel wall. Small volume (100cc) ascites. Small ovaries bilaterally, densely adherent to the pelvic cul de sac. Left ovary and tube densely adherent to sigmoid colon. Cirrhotic liver with hepatomegaly and splenomegaly.     This represented an optimal cytoreduction (R1) with visible disease residual on the bowel wall and mesentery (millial, <363mimplants).        04/18/2014 Tumor Marker    Patient's tumor was tested for the  following markers: CA125 Results of the tumor marker test revealed 122      05/16/2014 Tumor Marker    Patient's tumor was tested for the following markers: CA125 Results of the tumor marker test revealed 30      06/10/2014 Tumor Marker    Patient's tumor was tested for the following markers: CA125 Results of the tumor marker test revealed 19      07/04/2014 Imaging    Interval improvement in the appearance of peritoneal metastasis secondary to ovarian cancer. 2. 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       08/18/2014 Tumor Marker    Patient's tumor was tested for the following markers: CA125 Results of the tumor marker test revealed 16      10/13/2014 Tumor Marker    Patient's tumor was tested for the following markers: CA125 Results of the tumor marker test revealed 14      11/21/2014 Tumor Marker    Patient's tumor was tested for the following markers: CA125 Results of the tumor marker test revealed 16      12/28/2014 Tumor Marker    Patient's tumor was tested for the following markers: CA125 Results of the tumor marker test revealed 762      02/27/2015 Tumor Marker  Patient's tumor was tested for the following markers: CA125 Results of the tumor marker test revealed 20.2      03/22/2015 Imaging    Filler appearance to the prior exam, with very subtle fluid tracking along portions of the liver, and very minimal nodularity along the right paracolic gutter representing residua from the prior peritoneal cancer. The current abnormalities could simply be post therapy findings rather than necessarily representing residual malignancy. No new or enlarging lesions are identified. 2. Hepatic cirrhosis and splenomegaly. There is some gastric varices suggesting portal venous hypertension. 3. Left foraminal stenosis at L4-5 due to spurring. There is likely also some central narrowing of the thecal sac at this level. 4. Bibasilar scarring. 5. Chronic mild left mid  kidney scarring      03/27/2015 Tumor Marker    Patient's tumor was tested for the following markers: CA125 Results of the tumor marker test revealed 25.3      04/24/2015 Imaging    Disc bulge L2-3 with mild spinal stenosis and right foraminal encroachment. 2. Disc bulge L3-4 with mild spinal stenosis. 3. Multifactorial spinal, left lateral recess and foraminal stenosis L4-5. There is grade 1 anterolisthesis without evident dynamic instability.      05/29/2015 Tumor Marker    Patient's tumor was tested for the following markers: CA125 Results of the tumor marker test revealed 29.9      08/21/2015 Tumor Marker    Patient's tumor was tested for the following markers: CA125 Results of the tumor marker test revealed 45      09/18/2015 Tumor Marker    Patient's tumor was tested for the following markers: CA125 Results of the tumor marker test revealed 47      09/27/2015 Imaging    New small bowel mesenteric nodule and enlarged left external iliac lymph node, highly worrisome for metastatic disease. 2. Cirrhosis and splenomegaly      10/10/2015 Tumor Marker    Patient's tumor was tested for the following markers: CA125 Results of the tumor marker test revealed 53.4      10/10/2015 - 12/11/2015 Chemotherapy    She received 4 cycles of carboplatin only      11/20/2015 Tumor Marker    Patient's tumor was tested for the following markers: CA125 Results of the tumor marker test revealed 41.3      12/20/2015 Imaging    CT: Mild left external iliac lymphadenopathy is slightly decreased. Mildly enlarged lower mesenteric nodule is slightly decreased. 2. No new or progressive metastatic disease in the abdomen or pelvis. 3. Cirrhosis. Stable subcentimeter hypodense left liver lobe lesion. No new liver lesions. 4. Stable mild splenomegaly.  No ascites. 5. Mild sigmoid diverticulosis.      01/01/2016 -  Anti-estrogen oral therapy    She was placed on tamoxifen. Had letrozole initially but  switched to tamoxifen due to poor tolerance      01/15/2016 Tumor Marker    Patient's tumor was tested for the following markers: CA125 Results of the tumor marker test revealed 31.4      02/19/2016 Tumor Marker    Patient's tumor was tested for the following markers: CA125 Results of the tumor marker test revealed 36.5      05/13/2016 Imaging    No evidence of disease progression within the abdomen or pelvis. Previously noted small central mesenteric nodule and left external iliac node appear slightly smaller. 2. Stable changes of hepatic cirrhosis and portal hypertension with associated splenomegaly. No new or enlarging hepatic lesions are identified. 3.  No acute findings.      05/13/2016 Tumor Marker    Patient's tumor was tested for the following markers: CA125 Results of the tumor marker test revealed 41.2      06/24/2016 Tumor Marker    Patient's tumor was tested for the following markers: CA125 Results of the tumor marker test revealed 47.3      08/20/2016 Imaging    1. The left external iliac lymph node is slightly increased in size compared to the prior exam, currently 1.1 cm and previously 0.9 cm in diameter. 2. Stable central mesenteric lymph node at 0.9 cm in diameter. 3. Stable trace free pelvic fluid and stable slight thickening along the right paracolic gutter, without well-defined peritoneal nodularity. 4. Other imaging findings of potential clinical significance: Subsegmental atelectasis or scarring in the lung bases. Hepatic cirrhosis. Left renal scarring. Mild splenomegaly. Sigmoid colon diverticulosis. Impingement at L4-5 due to spondylosis and degenerative disc disease broad Schmorl' s nodes at L3-L4. Pelvic floor laxity      08/23/2016 Tumor Marker    Patient's tumor was tested for the following markers: CA125 Results of the tumor marker test revealed 80.2       INTERVAL HISTORY: Please see below for problem oriented charting. She returns for further  follow-up She is seen in the infusion room prior to intravenous iron therapy She had excessive pica recently Her neuropathic pain remained the same She is still taking gabapentin and hydrocodone as prescribed  She denies abdominal bloating  REVIEW OF SYSTEMS:   Constitutional: Denies fevers, chills or abnormal weight loss Eyes: Denies blurriness of vision Ears, nose, mouth, throat, and face: Denies mucositis or sore throat Respiratory: Denies cough, dyspnea or wheezes Cardiovascular: Denies palpitation, chest discomfort or lower extremity swelling Gastrointestinal:  Denies nausea, heartburn or change in bowel habits Skin: Denies abnormal skin rashes Lymphatics: Denies new lymphadenopathy or easy bruising Neurological:Denies numbness, tingling or new weaknesses Behavioral/Psych: Mood is stable, no new changes  All other systems were reviewed with the patient and are negative.  I have reviewed the past medical history, past surgical history, social history and family history with the patient and they are unchanged from previous note.  ALLERGIES:  is allergic to shellfish allergy; benadryl [diphenhydramine hcl]; penicillins; and tape.  MEDICATIONS:  Current Outpatient Prescriptions  Medication Sig Dispense Refill  . amLODipine (NORVASC) 5 MG tablet Take 5 mg by mouth daily.  0  . aspirin EC 81 MG tablet Take 81 mg by mouth daily.    . bisoprolol (ZEBETA) 5 MG tablet   0  . cholecalciferol (VITAMIN D) 1000 units tablet Take 1,000 Units by mouth daily.    Marland Kitchen gabapentin (NEURONTIN) 300 MG capsule Take 2 capsules (600 mg total) by mouth 2 (two) times daily. 90 capsule 3  . HYDROcodone-acetaminophen (NORCO/VICODIN) 5-325 MG tablet take 1 tablet by mouth every 6 hours if needed for MODERATE PAIN 60 tablet 0  . insulin degludec (TRESIBA) 100 UNIT/ML SOPN FlexTouch Pen Inject 13 Units into the skin daily.     Marland Kitchen lidocaine-prilocaine (EMLA) cream Apply to Porta-cath  1-2 hours prior to access as  directed. 30 g 1  . metFORMIN (GLUCOPHAGE-XR) 500 MG 24 hr tablet Take 1,000 mg by mouth 2 (two) times daily.   0  . pantoprazole (PROTONIX) 40 MG tablet Take 40 mg by mouth daily.   0  . rotigotine (NEUPRO) 4 MG/24HR Place 1 patch onto the skin daily. 30 patch 5  . tamoxifen (NOLVADEX) 20 MG tablet  Take 1 tablet (20 mg total) by mouth daily. 90 tablet 9   No current facility-administered medications for this visit.     PHYSICAL EXAMINATION: ECOG PERFORMANCE STATUS: 1 - Symptomatic but completely ambulatory GENERAL:alert, no distress and comfortable Musculoskeletal:no cyanosis of digits and no clubbing  NEURO: alert & oriented x 3 with fluent speech, no focal motor/sensory deficits  LABORATORY DATA:  I have reviewed the data as listed    Component Value Date/Time   NA 142 08/23/2016 0919   K 4.0 08/23/2016 0919   CL 105 03/30/2014 0618   CO2 24 08/23/2016 0919   GLUCOSE 140 08/23/2016 0919   BUN 14.5 08/23/2016 0919   CREATININE 0.8 08/23/2016 0919   CALCIUM 9.3 08/23/2016 0919   PROT 7.1 08/23/2016 0919   ALBUMIN 3.7 08/23/2016 0919   AST 63 (H) 08/23/2016 0919   ALT 93 (H) 08/23/2016 0919   ALKPHOS 105 08/23/2016 0919   BILITOT 0.58 08/23/2016 0919   GFRNONAA 86 (L) 03/30/2014 0618   GFRAA >90 03/30/2014 0618    No results found for: SPEP, UPEP  Lab Results  Component Value Date   WBC 4.6 08/23/2016   NEUTROABS 2.9 08/23/2016   HGB 11.6 08/23/2016   HCT 35.9 08/23/2016   MCV 84.4 08/23/2016   PLT 105 (L) 08/23/2016      Chemistry      Component Value Date/Time   NA 142 08/23/2016 0919   K 4.0 08/23/2016 0919   CL 105 03/30/2014 0618   CO2 24 08/23/2016 0919   BUN 14.5 08/23/2016 0919   CREATININE 0.8 08/23/2016 0919      Component Value Date/Time   CALCIUM 9.3 08/23/2016 0919   ALKPHOS 105 08/23/2016 0919   AST 63 (H) 08/23/2016 0919   ALT 93 (H) 08/23/2016 0919   BILITOT 0.58 08/23/2016 0919       RADIOGRAPHIC STUDIES: I have personally  reviewed the radiological images as listed and agreed with the findings in the report. Ct Chest W Contrast  Result Date: 08/20/2016 CLINICAL DATA:  Metastatic ovarian cancer. Prior omentectomy. Rising tumor markers. EXAM: CT CHEST, ABDOMEN, AND PELVIS WITH CONTRAST TECHNIQUE: Multidetector CT imaging of the chest, abdomen and pelvis was performed following the standard protocol during bolus administration of intravenous contrast. CONTRAST:  159m ISOVUE-300 IOPAMIDOL (ISOVUE-300) INJECTION 61% COMPARISON:  Multiple exams, including 05/13/2016 FINDINGS: CT CHEST FINDINGS Cardiovascular: Right Port-A-Cath tip: Cavoatrial junction. Mild atherosclerotic calcification of the aortic arch. Mediastinum/Nodes: Unremarkable Lungs/Pleura: There is combination of scarring and likely subsegmental atelectasis in the lower lobes, lingula, and right middle lobe. Some of this was also present on 04/10/2005. No worrisome pulmonary nodule. Musculoskeletal: Bilateral breast implants. Lower thoracic spondylosis. CT ABDOMEN PELVIS FINDINGS Hepatobiliary: Nodular liver contour compatible with cirrhosis. Subtle heterogeneity in the liver without a well-defined enhancing mass. Gallbladder unremarkable. No significant biliary dilatation. Pancreas: Unremarkable Spleen: The spleen measures 15.7 by 5.0 by 14.1 cm (volume = 580 cm^3). Adrenals/Urinary Tract: Stable mild scarring in the left kidney. Otherwise unremarkable. Stomach/Bowel: Sigmoid colon diverticulosis. Vascular/Lymphatic: Small scattered periaortic lymph nodes similar to prior. A left external iliac node measures 1.1 cm in short axis on image 103/2, formerly 0.9 cm. Reproductive: Hysterectomy. No mass along the vaginal cuff although there is a small amount of free fluid in this vicinity. Adjacent nodularity is likely attributable to the sigmoid diverticulosis. No adnexal mass. Other: Chronic slight thickening of the right paracolic gutter. Trace fluid or scarring along the  anterior margin of the lateral segment left  hepatic lobe on image 67/2, no change from prior. A central mesenteric lymph node measures 9 mm in short axis on image 87/2, formerly the same. No new peritoneal mass or new nodule identified. Musculoskeletal: Stable broad superior endplate scalloping at L3 with surrounding sclerosis, potentially a broad Schmorl's node or a superior endplate compression. Similar broad lucency along the inferior endplate of L4. Grade 1 degenerative retrolisthesis at L3-4 and grade 1 degenerative anterolisthesis at L4-5. There is central narrowing of the thecal sac and left foraminal stenosis at the L4-5 level. Pelvic floor laxity with mildly low position of the anorectal junction. IMPRESSION: 1. The left external iliac lymph node is slightly increased in size compared to the prior exam, currently 1.1 cm and previously 0.9 cm in diameter. 2. Stable central mesenteric lymph node at 0.9 cm in diameter. 3. Stable trace free pelvic fluid and stable slight thickening along the right paracolic gutter, without well-defined peritoneal nodularity. 4. Other imaging findings of potential clinical significance: Subsegmental atelectasis or scarring in the lung bases. Hepatic cirrhosis. Left renal scarring. Mild splenomegaly. Sigmoid colon diverticulosis. Impingement at L4-5 due to spondylosis and degenerative disc disease broad Schmorl' s nodes at L3-L4. Pelvic floor laxity. Electronically Signed   By: Van Clines M.D.   On: 08/20/2016 15:30   Ct Abdomen Pelvis W Contrast  Result Date: 08/20/2016 CLINICAL DATA:  Metastatic ovarian cancer. Prior omentectomy. Rising tumor markers. EXAM: CT CHEST, ABDOMEN, AND PELVIS WITH CONTRAST TECHNIQUE: Multidetector CT imaging of the chest, abdomen and pelvis was performed following the standard protocol during bolus administration of intravenous contrast. CONTRAST:  130m ISOVUE-300 IOPAMIDOL (ISOVUE-300) INJECTION 61% COMPARISON:  Multiple exams,  including 05/13/2016 FINDINGS: CT CHEST FINDINGS Cardiovascular: Right Port-A-Cath tip: Cavoatrial junction. Mild atherosclerotic calcification of the aortic arch. Mediastinum/Nodes: Unremarkable Lungs/Pleura: There is combination of scarring and likely subsegmental atelectasis in the lower lobes, lingula, and right middle lobe. Some of this was also present on 04/10/2005. No worrisome pulmonary nodule. Musculoskeletal: Bilateral breast implants. Lower thoracic spondylosis. CT ABDOMEN PELVIS FINDINGS Hepatobiliary: Nodular liver contour compatible with cirrhosis. Subtle heterogeneity in the liver without a well-defined enhancing mass. Gallbladder unremarkable. No significant biliary dilatation. Pancreas: Unremarkable Spleen: The spleen measures 15.7 by 5.0 by 14.1 cm (volume = 580 cm^3). Adrenals/Urinary Tract: Stable mild scarring in the left kidney. Otherwise unremarkable. Stomach/Bowel: Sigmoid colon diverticulosis. Vascular/Lymphatic: Small scattered periaortic lymph nodes similar to prior. A left external iliac node measures 1.1 cm in short axis on image 103/2, formerly 0.9 cm. Reproductive: Hysterectomy. No mass along the vaginal cuff although there is a small amount of free fluid in this vicinity. Adjacent nodularity is likely attributable to the sigmoid diverticulosis. No adnexal mass. Other: Chronic slight thickening of the right paracolic gutter. Trace fluid or scarring along the anterior margin of the lateral segment left hepatic lobe on image 67/2, no change from prior. A central mesenteric lymph node measures 9 mm in short axis on image 87/2, formerly the same. No new peritoneal mass or new nodule identified. Musculoskeletal: Stable broad superior endplate scalloping at L3 with surrounding sclerosis, potentially a broad Schmorl's node or a superior endplate compression. Similar broad lucency along the inferior endplate of L4. Grade 1 degenerative retrolisthesis at L3-4 and grade 1 degenerative  anterolisthesis at L4-5. There is central narrowing of the thecal sac and left foraminal stenosis at the L4-5 level. Pelvic floor laxity with mildly low position of the anorectal junction. IMPRESSION: 1. The left external iliac lymph node is slightly increased in size  compared to the prior exam, currently 1.1 cm and previously 0.9 cm in diameter. 2. Stable central mesenteric lymph node at 0.9 cm in diameter. 3. Stable trace free pelvic fluid and stable slight thickening along the right paracolic gutter, without well-defined peritoneal nodularity. 4. Other imaging findings of potential clinical significance: Subsegmental atelectasis or scarring in the lung bases. Hepatic cirrhosis. Left renal scarring. Mild splenomegaly. Sigmoid colon diverticulosis. Impingement at L4-5 due to spondylosis and degenerative disc disease broad Schmorl' s nodes at L3-L4. Pelvic floor laxity. Electronically Signed   By: Van Clines M.D.   On: 08/20/2016 15:30    ASSESSMENT & PLAN:  Primary peritoneal carcinomatosis ([place]) CT scan show minimum disease change but tumor markers are rising I recommend she proceed with IV iron and plan to see her back in my office in 2 weeks to discuss test results and next plan of care In the meantime, she will continue tamoxifen  Iron deficiency anemia She has recurrent iron deficiency anemia, likely due to diverticulosis and hemorrhoidal bleeding We will proceed with Intravenous iron as discussed The most likely cause of her anemia is due to chronic blood loss/malabsorption syndrome. We discussed some of the risks, benefits, and alternatives of intravenous iron infusions. The patient is symptomatic from anemia and the iron level is critically low. She tolerated oral iron supplement poorly and desires to achieved higher levels of iron faster for adequate hematopoesis. Some of the side-effects to be expected including risks of infusion reactions, phlebitis, headaches, nausea and fatigue.   The patient is willing to proceed. Patient education material was dispensed.  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 I recommend vitamin D and calcium supplements. We discussed trial of gabapentin which she tolerated well and has given her significant symptomatic improvement of her neuropathy and bone pain Recently, I increased gabapentin to 600 mg twice a day She found that very helpful without major side effects I recommend we continue the same dose and I refill her prescription I will continue to reassess in her next visit I refill her prescription pain medicine    No orders of the defined types were placed in this encounter.  All questions were answered. The patient knows to call the clinic with any problems, questions or concerns. No barriers to learning was detected. I spent 15 minutes counseling the patient face to face. The total time spent in the appointment was 20 minutes and more than 50% was on counseling and review of test results     [name], MD 08/24/2016 10:08 AM

## 2016-08-24 NOTE — Assessment & Plan Note (Signed)
She has leg pain, neuropathy, bone pain, restless legs and other symptom of musculoskeletal discomfort. I recommend vitamin D and calcium supplements. We discussed trial of gabapentin which she tolerated well and has given her significant symptomatic improvement of her neuropathy and bone pain Recently, I increased gabapentin to 600 mg twice a day She found that very helpful without major side effects I recommend we continue the same dose and I refill her prescription I will continue to reassess in her next visit I refill her prescription pain medicine

## 2016-08-26 ENCOUNTER — Telehealth: Payer: Self-pay

## 2016-08-26 NOTE — Telephone Encounter (Signed)
-----   Message from [name], MD sent at 08/24/2016  7:21 AM EDT ----- Regarding: tumor marker CA 125 is getting a bit worse I recommend earlier appt to discuss options I have requested appt to see me on 7/24 at 215 pm I have placed scheduling msg Please let her know scheduler will call ----- Message ----- From: Interface, [facility] Sent: 08/23/2016   9:50 AM To: [name], MD

## 2016-08-26 NOTE — Telephone Encounter (Signed)
Called with below message. 

## 2016-09-03 ENCOUNTER — Ambulatory Visit (HOSPITAL_BASED_OUTPATIENT_CLINIC_OR_DEPARTMENT_OTHER): Payer: Medicare Other | Admitting: Hematology and Oncology

## 2016-09-03 ENCOUNTER — Encounter: Payer: Self-pay | Admitting: Hematology and Oncology

## 2016-09-03 ENCOUNTER — Telehealth: Payer: Self-pay | Admitting: *Deleted

## 2016-09-03 VITALS — BP 140/48 | HR 74 | Temp 98.1°F | Resp 18 | Ht 61.5 in | Wt 193.5 lb

## 2016-09-03 DIAGNOSIS — K7581 Nonalcoholic steatohepatitis (NASH): Secondary | ICD-10-CM

## 2016-09-03 DIAGNOSIS — K7460 Unspecified cirrhosis of liver: Secondary | ICD-10-CM

## 2016-09-03 DIAGNOSIS — C482 Malignant neoplasm of peritoneum, unspecified: Secondary | ICD-10-CM | POA: Diagnosis present

## 2016-09-03 DIAGNOSIS — Z7189 Other specified counseling: Secondary | ICD-10-CM | POA: Diagnosis not present

## 2016-09-03 DIAGNOSIS — C569 Malignant neoplasm of unspecified ovary: Secondary | ICD-10-CM

## 2016-09-03 NOTE — Assessment & Plan Note (Signed)
She has baseline mild liver cirrhosis I recommend minor dose adjustment to chemotherapy to reduce the risk of toxicity especially with morbid obesity

## 2016-09-03 NOTE — Telephone Encounter (Signed)
Echo scheduled for 7/25 @ 1000. Pt confirmed

## 2016-09-03 NOTE — Patient Instructions (Signed)
Doxorubicin Liposomal injection What is this medicine? LIPOSOMAL DOXORUBICIN (LIP oh som al dox oh ROO bi sin) is a chemotherapy drug. This medicine is used to treat many kinds of cancer like Kaposi's sarcoma, multiple myeloma, and ovarian cancer. This medicine may be used for other purposes; ask your health care provider or pharmacist if you have questions. COMMON BRAND NAME(S): Doxil, Lipodox What should I tell my health care provider before I take this medicine? They need to know if you have any of these conditions: -blood disorders -heart disease -infection (especially a virus infection such as chickenpox, cold sores, or herpes) -liver disease -recent or ongoing radiation therapy -an unusual or allergic reaction to doxorubicin, other chemotherapy agents, soybeans, other medicines, foods, dyes, or preservatives -pregnant or trying to get pregnant -breast-feeding How should I use this medicine? This drug is given as an infusion into a vein. It is administered in a hospital or clinic by a specially trained health care professional. If you have pain, swelling, burning or any unusual feeling around the site of your injection, tell your health care professional right away. Talk to your pediatrician regarding the use of this medicine in children. Special care may be needed. Overdosage: If you think you have taken too much of this medicine contact a poison control center or emergency room at once. NOTE: This medicine is only for you. Do not share this medicine with others. What if I miss a dose? It is important not to miss your dose. Call your doctor or health care professional if you are unable to keep an appointment. What may interact with this medicine? Do not take this medicine with any of the following medications: -zidovudine This medicine may also interact with the following medications: -medicines to increase blood counts like filgrastim, pegfilgrastim, sargramostim -vaccines Talk to  your doctor or health care professional before taking any of these medicines: -acetaminophen -aspirin -ibuprofen -ketoprofen -naproxen This list may not describe all possible interactions. Give your health care provider a list of all the medicines, herbs, non-prescription drugs, or dietary supplements you use. Also tell them if you smoke, drink alcohol, or use illegal drugs. Some items may interact with your medicine. What should I watch for while using this medicine? Your condition will be monitored carefully while you are receiving this medicine. You will need important blood work done while you are taking this medicine. This drug may make you feel generally unwell. This is not uncommon, as chemotherapy can affect healthy cells as well as cancer cells. Report any side effects. Continue your course of treatment even though you feel ill unless your doctor tells you to stop. Your urine may turn orange-red for a few days after your dose. This is not blood. If your urine is dark or brown, call your doctor. In some cases, you may be given additional medicines to help with side effects. Follow all directions for their use. Call your doctor or health care professional for advice if you get a fever (100.5 degrees F or higher), chills or sore throat, or other symptoms of a cold or flu. Do not treat yourself. This drug decreases your body's ability to fight infections. Try to avoid being around people who are sick. This medicine may increase your risk to bruise or bleed. Call your doctor or health care professional if you notice any unusual bleeding. Be careful brushing and flossing your teeth or using a toothpick because you may get an infection or bleed more easily. If you have any dental  work done, tell your dentist you are receiving this medicine. Avoid taking products that contain aspirin, acetaminophen, ibuprofen, naproxen, or ketoprofen unless instructed by your doctor. These medicines may hide a  fever. Men and women of childbearing age should use effective birth control methods while using taking this medicine. Do not become pregnant while taking this medicine. There is a potential for serious side effects to an unborn child. Talk to your health care professional or pharmacist for more information. Do not breast-feed an infant while taking this medicine. Talk to your doctor about your risk of cancer. You may be more at risk for certain types of cancers if you take this medicine. What side effects may I notice from receiving this medicine? Side effects that you should report to your doctor or health care professional as soon as possible: -allergic reactions like skin rash, itching or hives, swelling of the face, lips, or tongue -low blood counts - this medicine may decrease the number of white blood cells, red blood cells and platelets. You may be at increased risk for infections and bleeding. -signs of hand-foot syndrome - tingling or burning, redness, flaking, swelling, small blisters, or small sores on the palms of your hands or the soles of your feet -signs of infection - fever or chills, cough, sore throat, pain or difficulty passing urine -signs of decreased platelets or bleeding - bruising, pinpoint red spots on the skin, black, tarry stools, blood in the urine -signs of decreased red blood cells - unusually weak or tired, fainting spells, lightheadedness -back pain, chills, facial flushing, fever, headache, tightness in the chest or throat during the infusion -breathing problems -chest pain -fast, irregular heartbeat -mouth pain, redness, sores -pain, swelling, redness at site where injected -pain, tingling, numbness in the hands or feet -swelling of ankles, feet, or hands -vomiting Side effects that usually do not require medical attention (report to your doctor or health care professional if they continue or are bothersome): -diarrhea -hair loss -loss of appetite -nail  discoloration or damage -nausea -red or watery eyes -red colored urine -stomach upset This list may not describe all possible side effects. Call your doctor for medical advice about side effects. You may report side effects to FDA at 1-800-FDA-1088. Where should I keep my medicine? This drug is given in a hospital or clinic and will not be stored at home. NOTE: This sheet is a summary. It may not cover all possible information. If you have questions about this medicine, talk to your doctor, pharmacist, or health care provider.  2018 Elsevier/Gold Standard (2011-10-18 10:12:56) Bevacizumab injection What is this medicine? BEVACIZUMAB (be va SIZ yoo mab) is a monoclonal antibody. It is used to treat many types of cancer. This medicine may be used for other purposes; ask your health care provider or pharmacist if you have questions. COMMON BRAND NAME(S): Avastin What should I tell my health care provider before I take this medicine? They need to know if you have any of these conditions: -diabetes -heart disease -high blood pressure -history of coughing up blood -prior anthracycline chemotherapy (e.g., doxorubicin, daunorubicin, epirubicin) -recent or ongoing radiation therapy -recent or planning to have surgery -stroke -an unusual or allergic reaction to bevacizumab, hamster proteins, mouse proteins, other medicines, foods, dyes, or preservatives -pregnant or trying to get pregnant -breast-feeding How should I use this medicine? This medicine is for infusion into a vein. It is given by a health care professional in a hospital or clinic setting. Talk to your pediatrician regarding  the use of this medicine in children. Special care may be needed. Overdosage: If you think you have taken too much of this medicine contact a poison control center or emergency room at once. NOTE: This medicine is only for you. Do not share this medicine with others. What if I miss a dose? It is important not  to miss your dose. Call your doctor or health care professional if you are unable to keep an appointment. What may interact with this medicine? Interactions are not expected. This list may not describe all possible interactions. Give your health care provider a list of all the medicines, herbs, non-prescription drugs, or dietary supplements you use. Also tell them if you smoke, drink alcohol, or use illegal drugs. Some items may interact with your medicine. What should I watch for while using this medicine? Your condition will be monitored carefully while you are receiving this medicine. You will need important blood work and urine testing done while you are taking this medicine. This medicine may increase your risk to bruise or bleed. Call your doctor or health care professional if you notice any unusual bleeding. This medicine should be started at least 28 days following major surgery and the site of the surgery should be totally healed. Check with your doctor before scheduling dental work or surgery while you are receiving this treatment. Talk to your doctor if you have recently had surgery or if you have a wound that has not healed. Do not become pregnant while taking this medicine or for 6 months after stopping it. Women should inform their doctor if they wish to become pregnant or think they might be pregnant. There is a potential for serious side effects to an unborn child. Talk to your health care professional or pharmacist for more information. Do not breast-feed an infant while taking this medicine and for 6 months after the last dose. This medicine has caused ovarian failure in some women. This medicine may interfere with the ability to have a child. You should talk to your doctor or health care professional if you are concerned about your fertility. What side effects may I notice from receiving this medicine? Side effects that you should report to your doctor or health care professional as  soon as possible: -allergic reactions like skin rash, itching or hives, swelling of the face, lips, or tongue -chest pain or chest tightness -chills -coughing up blood -high fever -seizures -severe constipation -signs and symptoms of bleeding such as bloody or black, tarry stools; red or dark-brown urine; spitting up blood or brown material that looks like coffee grounds; red spots on the skin; unusual bruising or bleeding from the eye, gums, or nose -signs and symptoms of a blood clot such as breathing problems; chest pain; severe, sudden headache; pain, swelling, warmth in the leg -signs and symptoms of a stroke like changes in vision; confusion; trouble speaking or understanding; severe headaches; sudden numbness or weakness of the face, arm or leg; trouble walking; dizziness; loss of balance or coordination -stomach pain -sweating -swelling of legs or ankles -vomiting -weight gain Side effects that usually do not require medical attention (report to your doctor or health care professional if they continue or are bothersome): -back pain -changes in taste -decreased appetite -dry skin -nausea -tiredness This list may not describe all possible side effects. Call your doctor for medical advice about side effects. You may report side effects to FDA at 1-800-FDA-1088. Where should I keep my medicine? This drug is given in  a hospital or clinic and will not be stored at home. NOTE: This sheet is a summary. It may not cover all possible information. If you have questions about this medicine, talk to your doctor, pharmacist, or health care provider.  2018 Elsevier/Gold Standard (2016-01-26 14:33:29)

## 2016-09-03 NOTE — Assessment & Plan Note (Signed)
Unfortunately, she has rising tumor markers and his CA 125 nearly double over the last few weeks Thankfully, she is relatively asymptomatic We discussed current guidelines and treatment options I recommend she discontinue tamoxifen as it is no longer beneficial We discussed the risks, benefits, side effects of various different combination chemotherapy and ultimately she decides to go on chemotherapy with Doxil and Avastin We discussed the role of chemotherapy. The intent is palliative.  J Clin Oncol. 2014 May 1;32(13):1302-8. doi: 10.1200/JCO.2013.51.4489. Epub 2014 Mar 17. 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 Pujade-Lauraine E1, Hilpert F, Weber B, Reuss A, Poveda A, [place] Citizen, Sorio R, Verg[place] I, [place], Bamias A, [place] D, Wimberger P, [place] A, Mirza MR, [place], [place] D, Ray-Coquard I. Chief Strategy Officer information 1 [employer], Group d'Investigateurs Nationaux pour [employer] (GINECO) and [employer], Assistance [place] de [place], [place]; Batrice Weber, GINECO and [employer], [place]; Marin Comment, GINECO and [place], [place]; [place], [place], [place], [place]; [place], [employer]eme[employer] Gyn[employer]olog[employer] On[employer]ologie (AGO) and [employer] fr Gynkologie und [place], [place]; Kerin Salen, AGO and [employer] for [employer], [place]; Lauris Poag, AGO and [place] of [place], [place], [place]; Scheryl Marten, [employer] de Pharmacist, community de [place] ([place]) and [place], [place]; Nelida Gores, GEICO and [facility], [place], [place]; Noe Gens, [place] Society of Gynaecological Oncology ([place]) and [facility], [place], [place] ([place]); Landry Corporal, Tax inspector [place] Trials in  Environmental manager and [place] a Field seismologist, [place], [place]; [place], [place] and [place], [place], [place]; Letta Pate, [place] [employer] and [facility], [place], the [place]; Arboriculturist, [place], [place], [place]; Adah Salvage, [place] and [place] do [place], [place], [place]; [place], [place] and [place], [place], [place]; and [place], [place]. [place], [place].  Erratum in  J Clin Oncol. 2014 Dec 10;32(35):4025.  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 Bevacizumab is active alone and in combination. AURELIA is the first randomized phase III trial to our knowledge combining bevacizumab with chemotherapy in platinum-resistant OC. PATIENTS AND METHODS:  Eligible patients had measurable/assessable OC that had progressed < 6 months after completing platinum-based therapy. Patients with refractory disease, history of bowel obstruction, or > two prior anticancer regimens were ineligible. After investigators selected chemotherapy (pegylated liposomal doxorubicin, weekly paclitaxel, or topotecan), patients were randomly assigned to single-agent chemotherapy alone or with bevacizumab (10 mg/kg every 2 weeks or 15 mg/kg every 3 weeks) until progression, unacceptable toxicity, or consent withdrawal. Crossover to single-agent bevacizumab was permitted after progression with chemotherapy alone. The primary end point was progression-free survival (PFS) by RECIST. Secondary end points included objective response rate (ORR), overall survival (OS), safety, and patient-reported outcomes. RESULTS:  The PFS hazard ratio (HR) after PFS events in 301 of 361 patients was 0.48 (95% CI, 0.38 to 0.60; unstratified  log-rank P < .001). Median PFS was 3.4 months with chemotherapy alone versus 6.7 months with bevacizumab-containing therapy. RECIST ORR was 11.8% versus 27.3%, respectively (P = .001). The OS HR was 0.85 (95% CI, 0.66 to 1.08; P < .174; median OS, 13.3 v 16.6 months, respectively). Grade ? 2 hypertension and proteinuria were more common with bevacizumab. GI perforation occurred in 2.2% of bevacizumab-treated patients. CONCLUSION:  Adding bevacizumab to chemotherapy statistically  significantly improved PFS and ORR; the OS trend was not significant. No new safety signals were observed.  We discussed some of the risks, benefits and side-effects of  Doxil and Avastin  Some of the short term side-effects included, though not limited to, risk of fatigue, weight loss, tumor lysis syndrome, risk of allergic reactions, pancytopenia, life-threatening infections, need for transfusions of blood products, nausea, vomiting, change in bowel habits, hair loss, risk of congestive heart failure, admission to hospital for various reasons, and risks of death.   Long term side-effects are also discussed including permanent damage to nerve function, chronic fatigue, and rare secondary malignancy including bone marrow disorders.   The patient is aware that the response rates discussed earlier is not guaranteed.    After a long discussion, patient made an informed decision to proceed with the prescribed plan of care.   Patient education material was dispensed  I plan to give her minimum 3 cycles of chemotherapy along with tumor marker monitoring I will order echocardiogram before we proceed with treatment

## 2016-09-03 NOTE — Progress Notes (Signed)
OFF PATHWAY REGIMEN - [Other Dx]  No Change  Continue With Treatment as Ordered.   [id number]:Carboplatin + Paclitaxel (5/175) q21d:   A cycle is every 21 days:     Paclitaxel        Dose Mod: None     Carboplatin        Dose Mod: None  **Always confirm dose/schedule in your pharmacy ordering system**    Patient Characteristics: Intent of Therapy: Non-Curative / Palliative Intent, Discussed with Patient

## 2016-09-03 NOTE — Progress Notes (Signed)
DISCONTINUE OFF PATHWAY REGIMEN - [Other Dx]   [id number]:Carboplatin + Paclitaxel (5/175) q21d:   A cycle is every 21 days:     Paclitaxel        Dose Mod: None     Carboplatin        Dose Mod: None  **Always confirm dose/schedule in your pharmacy ordering system**    REASON: Disease Progression PRIOR TREATMENT: Carboplatin + Paclitaxel (5/175) q21d TREATMENT RESPONSE: Progressive Disease (PD)  START OFF PATHWAY REGIMEN - [Other Dx]   [id number]:Liposomal Doxorubicin (Doxil) 40 mg/m2  D1 + Bevacizumab 10 mg/kg D1, 15 q28 Days:   A cycle is every 28 days:     Liposomal doxorubicin      Bevacizumab   **Always confirm dose/schedule in your pharmacy ordering system**    Patient Characteristics: Intent of Therapy: Non-Curative / Palliative Intent, Discussed with Patient

## 2016-09-03 NOTE — Assessment & Plan Note (Signed)
The patient is aware she has incurable disease and treatment is strictly palliative. We discussed importance of Advanced Directives and Living will. We discussed CODE STATUS; the patient desires to remain in full code. Her husband is the medical healthcare power of attorney

## 2016-09-03 NOTE — Progress Notes (Signed)
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  No worrisome liver mass.  There are associated portal venous collaterals and splenomegaly consistent with portal venous hypertension. 2.  No other significant upper abdominal findings      11/08/2013 Imaging    US showed ascites      11/08/2013 Initial Diagnosis    Patient had ~ 2 months of some urinary incontinence, saw Dr Ronita Hipps in 10-2013, with pelvic mass on exam       11/12/2013 Imaging    There are findings of progressive hepatic cirrhosis with a large amount of ascites and mild splenomegaly. There are venous collaterals present. 2. There is abnormal thickening of the peritoneal surface along the left mid and lower abdominal wall. There is abnormal soft tissue density in the pelvis as well which could reflect the clinically suspected ovarian malignancy but the findings are nondiagnostic. 3. There is a new small left pleural effusion. 4. There is no acute bowel abnormality.      11/18/2013 Imaging    Successful ultrasound guided diagnostic and therapeutic paracentesis yielding 4.1 liters of ascites.       11/18/2013 Pathology Results    PERITONEAL/ASCITIC FLUID (SPECIMEN 1 OF 1 COLLECTED 11/18/13): SEROUS CARCINOMA, PLEASE SEE COMMENT.      12/01/2013 Tumor Marker    Patient's tumor was tested for the following markers: CA125 Results of the tumor marker test revealed 1938      12/03/2013 Imaging    Successful ultrasound-guided therapeutic paracentesis yielding 3.6 liters of peritoneal fluid.      12/07/2013 - 05/30/2014 Chemotherapy    She received 3 cycles of carboplatin and Taxol, interrupted cycle 4 for surgery.  She  subsequently completed 3 more cycles of chemotherapy after surgery      12/20/2013 Imaging    Successful ultrasound-guided diagnostic and therapeutic paracentesis yielding 2.2 liters of peritoneal fluid      12/20/2013 Pathology Results    PERITONEAL/ASCITIC FLUID(SPECIMEN 1 OF 1 COLLECTED 12/20/13): MALIGNANT CELLS CONSISTENT WITH SEROUS CARCINOMA      01/13/2014 Tumor Marker    Patient's tumor was tested for the following markers: CA125 Results of the tumor marker test revealed 227      02/07/2014 Tumor Marker    Patient's tumor was tested for the following markers: CA125 Results of the tumor marker test revealed 130      02/15/2014 Genetic Testing    Genetics testing from 02-2014 normal ([employer] panel)      02/23/2014 Imaging    Interval decrease in ascites. 2. Morphologic changes in the liver consistent with cirrhosis. 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 Portal vein remains patent at this time. 3. Persistent but decreased abnormal soft tissue attenuation tracking in the omental fat. This may be secondary to interval improvement in metastatic disease. 4. Peritoneal thickening seen in the left abdomen and pelvis on the previous study has decreased and nearly resolved in the interval. This also suggests interval improvement in metastatic disease.       03/07/2014 Procedure    Ultrasound and fluoroscopically guided right internal jugular single lumen power port catheter insertion. Tip in the SVC/RA junction. Catheter ready for use.      03/17/2014  Tumor Marker    Patient's tumor was tested for the following markers: CA125 Results of the tumor marker test revealed 45      03/29/2014 Pathology Results    1. Omentum, resection for tumor - HIGH GRADE SEROUS CARCINOMA, SEE COMMENT. 2. Ovary and fallopian tube, right - HIGH GRADE SEROUS CARCINOMA, SEE COMMENT. 3. Ovary and fallopian tube, left - HIGH GRADE SEROUS CARCINOMA, SEE  COMMENT. Diagnosis Note 1. Nests and clusters of malignant cells are invading the omental tissue (part #1) with associated fibrosis. The cells are pleomorphic with prominent nucleoli. There are scattered psammoma bodies. The ovaries are atrophic and exhibit multiple foci of surface based invasive carcinoma and associated fibrosis. The fallopian tubes have a few foci of carcinoma, also superficially located. There are no precursor lesions noted in the ovaries or fallopian tubes (the entire tubes and ovaries were submitted for evaluation). While there is some retraction artifact, there are several foci suspicious for lymphovascular invasion. 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 Given the fibrosis, there does appear to be a small amount of treatment effect, however, there is abundant residual tumor.       03/29/2014 Surgery    Procedure(s) Performed: 1. Exploratory laparotomy with bilateral salpingo-oophorectomy, omentectomy radical tumor debulking for ovarian cancer .  Surgeon: Thereasa Solo, MD.  Assistant Surgeon: Lahoma Crocker, M.D. Assistant: (an MD assistant was necessary for tissue manipulation, retraction and positioning due to the complexity of the case and hospital policies).  Operative Findings: 10cm omental cake from hepatic to splenic flexure, densely adherent to transverse colon. Milliary studding of tumor implants (<39m, too numerous in number to count) adherent to the mesentery of the small bowel and small bowel wall. Small volume (100cc) ascites. Small ovaries bilaterally, densely adherent to the pelvic cul de sac. Left ovary and tube densely adherent to sigmoid colon. Cirrhotic liver with hepatomegaly and splenomegaly.     This represented an optimal cytoreduction (R1) with visible disease residual on the bowel wall and mesentery (millial, <38m implants).        04/18/2014 Tumor Marker    Patient's tumor was tested for the following markers: CA125 Results of the tumor marker test revealed 122      05/16/2014 Tumor Marker    Patient's tumor was tested for the following markers: CA125 Results of the tumor marker test revealed 30      06/10/2014 Tumor Marker    Patient's tumor was tested for the following markers: CA125 Results of the tumor marker test revealed 19      07/04/2014 Imaging    Interval improvement in the appearance of peritoneal metastasis secondary to ovarian cancer. 2. 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       08/18/2014 Tumor Marker    Patient's tumor was tested for the following markers: CA125 Results of the tumor marker test revealed 16      10/13/2014 Tumor Marker    Patient's tumor was tested for the following markers: CA125 Results of the tumor marker test revealed 14      11/21/2014 Tumor Marker    Patient's tumor was tested for the following markers: CA125 Results of the tumor marker test revealed 16      12/28/2014 Tumor Marker    Patient's tumor was tested for the following markers: CA125 Results of the tumor marker test revealed 762  02/27/2015 Tumor Marker    Patient's tumor was tested for the following markers: CA125 Results of the tumor marker test revealed 20.2      03/22/2015 Imaging    Filler appearance to the prior exam, with very subtle fluid tracking along portions of the liver, and very minimal nodularity along the right paracolic gutter representing residua from the prior peritoneal cancer. The current abnormalities could simply be post therapy findings rather than necessarily representing residual malignancy. No new or enlarging lesions are identified. 2. Hepatic cirrhosis and splenomegaly. There is some gastric varices suggesting portal venous hypertension. 3. Left foraminal stenosis at L4-5 due to spurring. There is likely also some central narrowing  of the thecal sac at this level. 4. Bibasilar scarring. 5. Chronic mild left mid kidney scarring      03/27/2015 Tumor Marker    Patient's tumor was tested for the following markers: CA125 Results of the tumor marker test revealed 25.3      04/24/2015 Imaging    Disc bulge L2-3 with mild spinal stenosis and right foraminal encroachment. 2. Disc bulge L3-4 with mild spinal stenosis. 3. Multifactorial spinal, left lateral recess and foraminal stenosis L4-5. There is grade 1 anterolisthesis without evident dynamic instability.      05/29/2015 Tumor Marker    Patient's tumor was tested for the following markers: CA125 Results of the tumor marker test revealed 29.9      08/21/2015 Tumor Marker    Patient's tumor was tested for the following markers: CA125 Results of the tumor marker test revealed 45      09/18/2015 Tumor Marker    Patient's tumor was tested for the following markers: CA125 Results of the tumor marker test revealed 47      09/27/2015 Imaging    New small bowel mesenteric nodule and enlarged left external iliac lymph node, highly worrisome for metastatic disease. 2. Cirrhosis and splenomegaly      10/10/2015 Tumor Marker    Patient's tumor was tested for the following markers: CA125 Results of the tumor marker test revealed 53.4      10/10/2015 - 12/11/2015 Chemotherapy    She received 4 cycles of carboplatin only      11/20/2015 Tumor Marker    Patient's tumor was tested for the following markers: CA125 Results of the tumor marker test revealed 41.3      12/20/2015 Imaging    CT: Mild left external iliac lymphadenopathy is slightly decreased. Mildly enlarged lower mesenteric nodule is slightly decreased. 2. No new or progressive metastatic disease in the abdomen or pelvis. 3. Cirrhosis. Stable subcentimeter hypodense left liver lobe lesion. No new liver lesions. 4. Stable mild splenomegaly.  No ascites. 5. Mild sigmoid diverticulosis.      01/01/2016 -  Anti-estrogen  oral therapy    She was placed on tamoxifen. Had letrozole initially but switched to tamoxifen due to poor tolerance      01/15/2016 Tumor Marker    Patient's tumor was tested for the following markers: CA125 Results of the tumor marker test revealed 31.4      02/19/2016 Tumor Marker    Patient's tumor was tested for the following markers: CA125 Results of the tumor marker test revealed 36.5      05/13/2016 Imaging    No evidence of disease progression within the abdomen or pelvis. Previously noted small central mesenteric nodule and left external iliac node appear slightly smaller. 2. Stable changes of hepatic cirrhosis and portal hypertension with associated splenomegaly. No new or  enlarging hepatic lesions are identified. 3. No acute findings.      05/13/2016 Tumor Marker    Patient's tumor was tested for the following markers: CA125 Results of the tumor marker test revealed 41.2      06/24/2016 Tumor Marker    Patient's tumor was tested for the following markers: CA125 Results of the tumor marker test revealed 47.3      08/20/2016 Imaging    1. The left external iliac lymph node is slightly increased in size compared to the prior exam, currently 1.1 cm and previously 0.9 cm in diameter. 2. Stable central mesenteric lymph node at 0.9 cm in diameter. 3. Stable trace free pelvic fluid and stable slight thickening along the right paracolic gutter, without well-defined peritoneal nodularity. 4. Other imaging findings of potential clinical significance: Subsegmental atelectasis or scarring in the lung bases. Hepatic cirrhosis. Left renal scarring. Mild splenomegaly. Sigmoid colon diverticulosis. Impingement at L4-5 due to spondylosis and degenerative disc disease broad Schmorl' s nodes at L3-L4. Pelvic floor laxity      08/23/2016 Tumor Marker    Patient's tumor was tested for the following markers: CA125 Results of the tumor marker test revealed 80.2       INTERVAL HISTORY: Please see  below for problem oriented charting. She returns with her husband to discuss test results and plan of care She is relatively asymptomatic She denies abdominal pain or changes in bowel habits She is very concerned about rising tumor markers and despite CT imaging show minimum disease, she wants to proceed with chemotherapy, after understanding the risk and benefit of treatment now versus later Her neuropathy is improving while on gabapentin  REVIEW OF SYSTEMS:   Constitutional: Denies fevers, chills or abnormal weight loss Eyes: Denies blurriness of vision Ears, nose, mouth, throat, and face: Denies mucositis or sore throat Respiratory: Denies cough, dyspnea or wheezes Cardiovascular: Denies palpitation, chest discomfort or lower extremity swelling Gastrointestinal:  Denies nausea, heartburn or change in bowel habits Skin: Denies abnormal skin rashes Lymphatics: Denies new lymphadenopathy or easy bruising Neurological:Denies numbness, tingling or new weaknesses Behavioral/Psych: Mood is stable, no new changes  All other systems were reviewed with the patient and are negative.  I have reviewed the past medical history, past surgical history, social history and family history with the patient and they are unchanged from previous note.  ALLERGIES:  is allergic to shellfish allergy; benadryl [diphenhydramine hcl]; penicillins; and tape.  MEDICATIONS:  Current Outpatient Prescriptions  Medication Sig Dispense Refill  . amLODipine (NORVASC) 5 MG tablet Take 5 mg by mouth daily.  0  . aspirin EC 81 MG tablet Take 81 mg by mouth daily.    . bisoprolol (ZEBETA) 5 MG tablet   0  . cholecalciferol (VITAMIN D) 1000 units tablet Take 1,000 Units by mouth daily.    Marland Kitchen gabapentin (NEURONTIN) 300 MG capsule Take 2 capsules (600 mg total) by mouth 2 (two) times daily. 90 capsule 3  . HYDROcodone-acetaminophen (NORCO/VICODIN) 5-325 MG tablet take 1 tablet by mouth every 6 hours if needed for MODERATE  PAIN 60 tablet 0  . insulin degludec (TRESIBA) 100 UNIT/ML SOPN FlexTouch Pen Inject 13 Units into the skin daily.     Marland Kitchen lidocaine-prilocaine (EMLA) cream Apply to Porta-cath  1-2 hours prior to access as directed. 30 g 1  . metFORMIN (GLUCOPHAGE-XR) 500 MG 24 hr tablet Take 1,000 mg by mouth 2 (two) times daily.   0  . pantoprazole (PROTONIX) 40 MG tablet Take 40 mg  by mouth daily.   0  . rotigotine (NEUPRO) 4 MG/24HR Place 1 patch onto the skin daily. 30 patch 5  . tamoxifen (NOLVADEX) 20 MG tablet Take 1 tablet (20 mg total) by mouth daily. 90 tablet 9   No current facility-administered medications for this visit.     PHYSICAL EXAMINATION: ECOG PERFORMANCE STATUS: 1 - Symptomatic but completely ambulatory  Vitals:   09/03/16 1340  BP: (!) 140/48  Pulse: 74  Resp: 18  Temp: 98.1 F (36.7 C)   Filed Weights   09/03/16 1340  Weight: 193 lb 8 oz (87.8 kg)    GENERAL:alert, no distress and comfortable SKIN: skin color, texture, turgor are normal, no rashes or significant lesions EYES: normal, Conjunctiva are pink and non-injected, sclera clear Musculoskeletal:no cyanosis of digits and no clubbing  NEURO: alert & oriented x 3 with fluent speech, no focal motor/sensory deficits  LABORATORY DATA:  I have reviewed the data as listed    Component Value Date/Time   NA 142 08/23/2016 0919   K 4.0 08/23/2016 0919   CL 105 03/30/2014 0618   CO2 24 08/23/2016 0919   GLUCOSE 140 08/23/2016 0919   BUN 14.5 08/23/2016 0919   CREATININE 0.8 08/23/2016 0919   CALCIUM 9.3 08/23/2016 0919   PROT 7.1 08/23/2016 0919   ALBUMIN 3.7 08/23/2016 0919   AST 63 (H) 08/23/2016 0919   ALT 93 (H) 08/23/2016 0919   ALKPHOS 105 08/23/2016 0919   BILITOT 0.58 08/23/2016 0919   GFRNONAA 86 (L) 03/30/2014 0618   GFRAA >90 03/30/2014 0618    No results found for: SPEP, UPEP  Lab Results  Component Value Date   WBC 4.6 08/23/2016   NEUTROABS 2.9 08/23/2016   HGB 11.6 08/23/2016   HCT 35.9  08/23/2016   MCV 84.4 08/23/2016   PLT 105 (L) 08/23/2016      Chemistry      Component Value Date/Time   NA 142 08/23/2016 0919   K 4.0 08/23/2016 0919   CL 105 03/30/2014 0618   CO2 24 08/23/2016 0919   BUN 14.5 08/23/2016 0919   CREATININE 0.8 08/23/2016 0919      Component Value Date/Time   CALCIUM 9.3 08/23/2016 0919   ALKPHOS 105 08/23/2016 0919   AST 63 (H) 08/23/2016 0919   ALT 93 (H) 08/23/2016 0919   BILITOT 0.58 08/23/2016 0919       RADIOGRAPHIC STUDIES: I have personally reviewed the radiological images as listed and agreed with the findings in the report. Ct Chest W Contrast  Result Date: 08/20/2016 CLINICAL DATA:  Metastatic ovarian cancer. Prior omentectomy. Rising tumor markers. EXAM: CT CHEST, ABDOMEN, AND PELVIS WITH CONTRAST TECHNIQUE: Multidetector CT imaging of the chest, abdomen and pelvis was performed following the standard protocol during bolus administration of intravenous contrast. CONTRAST:  153m ISOVUE-300 IOPAMIDOL (ISOVUE-300) INJECTION 61% COMPARISON:  Multiple exams, including 05/13/2016 FINDINGS: CT CHEST FINDINGS Cardiovascular: Right Port-A-Cath tip: Cavoatrial junction. Mild atherosclerotic calcification of the aortic arch. Mediastinum/Nodes: Unremarkable Lungs/Pleura: There is combination of scarring and likely subsegmental atelectasis in the lower lobes, lingula, and right middle lobe. Some of this was also present on 04/10/2005. No worrisome pulmonary nodule. Musculoskeletal: Bilateral breast implants. Lower thoracic spondylosis. CT ABDOMEN PELVIS FINDINGS Hepatobiliary: Nodular liver contour compatible with cirrhosis. Subtle heterogeneity in the liver without a well-defined enhancing mass. Gallbladder unremarkable. No significant biliary dilatation. Pancreas: Unremarkable Spleen: The spleen measures 15.7 by 5.0 by 14.1 cm (volume = 580 cm^3). Adrenals/Urinary Tract: Stable mild scarring  in the left kidney. Otherwise unremarkable. Stomach/Bowel:  Sigmoid colon diverticulosis. Vascular/Lymphatic: Small scattered periaortic lymph nodes similar to prior. A left external iliac node measures 1.1 cm in short axis on image 103/2, formerly 0.9 cm. Reproductive: Hysterectomy. No mass along the vaginal cuff although there is a small amount of free fluid in this vicinity. Adjacent nodularity is likely attributable to the sigmoid diverticulosis. No adnexal mass. Other: Chronic slight thickening of the right paracolic gutter. Trace fluid or scarring along the anterior margin of the lateral segment left hepatic lobe on image 67/2, no change from prior. A central mesenteric lymph node measures 9 mm in short axis on image 87/2, formerly the same. No new peritoneal mass or new nodule identified. Musculoskeletal: Stable broad superior endplate scalloping at L3 with surrounding sclerosis, potentially a broad Schmorl's node or a superior endplate compression. Similar broad lucency along the inferior endplate of L4. Grade 1 degenerative retrolisthesis at L3-4 and grade 1 degenerative anterolisthesis at L4-5. There is central narrowing of the thecal sac and left foraminal stenosis at the L4-5 level. Pelvic floor laxity with mildly low position of the anorectal junction. IMPRESSION: 1. The left external iliac lymph node is slightly increased in size compared to the prior exam, currently 1.1 cm and previously 0.9 cm in diameter. 2. Stable central mesenteric lymph node at 0.9 cm in diameter. 3. Stable trace free pelvic fluid and stable slight thickening along the right paracolic gutter, without well-defined peritoneal nodularity. 4. Other imaging findings of potential clinical significance: Subsegmental atelectasis or scarring in the lung bases. Hepatic cirrhosis. Left renal scarring. Mild splenomegaly. Sigmoid colon diverticulosis. Impingement at L4-5 due to spondylosis and degenerative disc disease broad Schmorl' s nodes at L3-L4. Pelvic floor laxity. Electronically Signed   By:  Van Clines M.D.   On: 08/20/2016 15:30   Ct Abdomen Pelvis W Contrast  Result Date: 08/20/2016 CLINICAL DATA:  Metastatic ovarian cancer. Prior omentectomy. Rising tumor markers. EXAM: CT CHEST, ABDOMEN, AND PELVIS WITH CONTRAST TECHNIQUE: Multidetector CT imaging of the chest, abdomen and pelvis was performed following the standard protocol during bolus administration of intravenous contrast. CONTRAST:  185m ISOVUE-300 IOPAMIDOL (ISOVUE-300) INJECTION 61% COMPARISON:  Multiple exams, including 05/13/2016 FINDINGS: CT CHEST FINDINGS Cardiovascular: Right Port-A-Cath tip: Cavoatrial junction. Mild atherosclerotic calcification of the aortic arch. Mediastinum/Nodes: Unremarkable Lungs/Pleura: There is combination of scarring and likely subsegmental atelectasis in the lower lobes, lingula, and right middle lobe. Some of this was also present on 04/10/2005. No worrisome pulmonary nodule. Musculoskeletal: Bilateral breast implants. Lower thoracic spondylosis. CT ABDOMEN PELVIS FINDINGS Hepatobiliary: Nodular liver contour compatible with cirrhosis. Subtle heterogeneity in the liver without a well-defined enhancing mass. Gallbladder unremarkable. No significant biliary dilatation. Pancreas: Unremarkable Spleen: The spleen measures 15.7 by 5.0 by 14.1 cm (volume = 580 cm^3). Adrenals/Urinary Tract: Stable mild scarring in the left kidney. Otherwise unremarkable. Stomach/Bowel: Sigmoid colon diverticulosis. Vascular/Lymphatic: Small scattered periaortic lymph nodes similar to prior. A left external iliac node measures 1.1 cm in short axis on image 103/2, formerly 0.9 cm. Reproductive: Hysterectomy. No mass along the vaginal cuff although there is a small amount of free fluid in this vicinity. Adjacent nodularity is likely attributable to the sigmoid diverticulosis. No adnexal mass. Other: Chronic slight thickening of the right paracolic gutter. Trace fluid or scarring along the anterior margin of the lateral  segment left hepatic lobe on image 67/2, no change from prior. A central mesenteric lymph node measures 9 mm in short axis on image 87/2, formerly the same.  No new peritoneal mass or new nodule identified. Musculoskeletal: Stable broad superior endplate scalloping at L3 with surrounding sclerosis, potentially a broad Schmorl's node or a superior endplate compression. Similar broad lucency along the inferior endplate of L4. Grade 1 degenerative retrolisthesis at L3-4 and grade 1 degenerative anterolisthesis at L4-5. There is central narrowing of the thecal sac and left foraminal stenosis at the L4-5 level. Pelvic floor laxity with mildly low position of the anorectal junction. IMPRESSION: 1. The left external iliac lymph node is slightly increased in size compared to the prior exam, currently 1.1 cm and previously 0.9 cm in diameter. 2. Stable central mesenteric lymph node at 0.9 cm in diameter. 3. Stable trace free pelvic fluid and stable slight thickening along the right paracolic gutter, without well-defined peritoneal nodularity. 4. Other imaging findings of potential clinical significance: Subsegmental atelectasis or scarring in the lung bases. Hepatic cirrhosis. Left renal scarring. Mild splenomegaly. Sigmoid colon diverticulosis. Impingement at L4-5 due to spondylosis and degenerative disc disease broad Schmorl' s nodes at L3-L4. Pelvic floor laxity. Electronically Signed   By: Van Clines M.D.   On: 08/20/2016 15:30    ASSESSMENT & PLAN:  Primary peritoneal carcinomatosis ([place]) Unfortunately, she has rising tumor markers and his CA 125 nearly double over the last few weeks Thankfully, she is relatively asymptomatic We discussed current guidelines and treatment options I recommend she discontinue tamoxifen as it is no longer beneficial We discussed the risks, benefits, side effects of various different combination chemotherapy and ultimately she decides to go on chemotherapy with Doxil and  Avastin We discussed the role of chemotherapy. The intent is palliative.  J Clin Oncol. 2014 May 1;32(13):1302-8. doi: 10.1200/JCO.2013.51.4489. Epub 2014 Mar 17. 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 Pujade-Lauraine E1, Hilpert F, Weber B, Reuss A, [place] A, [place] Citizen, Sorio R, [place] I, [place], Bamias A, [place] D, Wimberger P, [place] A, Mirza MR, [place], [place] D, Ray-Coquard I. Chief Strategy Officer information 1 [employer], [employer] pour [employer] ([employer]) and [employer], [employer] [place], [place]; Batrice Weber, [employer]O and [employer], [place]; [place] Comment, [employer] and [place], [place]; [place], [place], [place], [place]; [place], [employer] ([employer]) and [employer] fr [employer] und [place], [place]; Kerin Salen, AGO and [employer] for [employer], [place]; Lauris Poag, AGO and [place] of [place], [place], [place]; Scheryl Marten, [employer] de Pharmacist, community de [place] ([place]) and [place], [place]; Nelida Gores, [employer] and [facility], [place], [place]; Noe Gens, [place] [employer] ([place]) and [facility], [place], [place]); Landry Corporal, Tax inspector [place] [employer] in Environmental manager and [place] a Field seismologist, [place], [place]; [place], [place] and [place], [place], [place]; Letta Pate, [place] [facility] and [facility], [place], the [place]; Arboriculturist, [place], [place], [place]; Adah Salvage, [place] and [place] do [place], [place], [place]; [place], [place] and [place], [place], [place]; and [place], [place]. [place], [place].  Erratum in  J Clin Oncol. 2014 Dec 10;32(35):4025.  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 Bevacizumab is active alone and in combination. [facility] is the first randomized phase III trial to our knowledge combining bevacizumab with chemotherapy in platinum-resistant OC. PATIENTS AND METHODS:  Eligible patients had measurable/assessable OC that had progressed < 6 months after completing platinum-based therapy. Patients with refractory disease, history of bowel obstruction, or > two prior anticancer regimens were ineligible. After investigators selected chemotherapy (pegylated liposomal doxorubicin, weekly paclitaxel, or topotecan), patients were randomly assigned to single-agent chemotherapy alone or with bevacizumab (10 mg/kg every 2 weeks or 15 mg/kg every 3 weeks) until progression, unacceptable toxicity, or consent withdrawal. Crossover to single-agent bevacizumab was permitted after progression with chemotherapy alone. The primary end point was progression-free survival (PFS) by RECIST. Secondary end points included objective response rate (ORR), overall survival (OS), safety, and patient-reported outcomes. RESULTS:  The PFS hazard ratio (HR) after PFS events in 301 of 361 patients was 0.48 (95% CI, 0.38 to 0.60; unstratified log-rank P < .001). Median PFS was 3.4 months with chemotherapy alone versus 6.7 months with bevacizumab-containing therapy. RECIST ORR was 11.8% versus 27.3%, respectively (P = .001). The OS HR was 0.85 (95% CI, 0.66 to 1.08; P < .174; median OS, 13.3 v 16.6 months, respectively). Grade ? 2 hypertension and proteinuria were more common with bevacizumab. GI perforation occurred in 2.2% of bevacizumab-treated  patients. CONCLUSION:  Adding bevacizumab to chemotherapy statistically significantly improved PFS and ORR; the OS trend was not significant. No new safety signals were observed.  We discussed some of the risks, benefits and side-effects of  Doxil and Avastin  Some of the short term side-effects included, though not limited to, risk of fatigue, weight loss, tumor lysis syndrome, risk of allergic reactions, pancytopenia, life-threatening infections, need for transfusions of blood products, nausea, vomiting, change in bowel habits, hair loss, risk of congestive heart failure, admission to hospital for various reasons, and risks of death.   Long term side-effects are also discussed including permanent damage to nerve function, chronic fatigue, and rare secondary malignancy including bone marrow disorders.   The patient is aware that the response rates discussed earlier is not guaranteed.    After a long discussion, patient made an informed decision to proceed with the prescribed plan of care.   Patient education material was dispensed  I plan to give her minimum 3 cycles of chemotherapy along with tumor marker monitoring I will order echocardiogram before we proceed with treatment  Liver cirrhosis secondary to NASH [facility]) She has baseline mild liver cirrhosis I recommend minor dose adjustment to chemotherapy to reduce the risk of toxicity especially with morbid obesity  Goals of care, counseling/discussion The patient is aware she has incurable disease and treatment is strictly palliative. We discussed importance of Advanced Directives and Living will. We discussed CODE STATUS; the patient desires to remain in full code. Her husband is the medical healthcare power of attorney   Orders Placed This Encounter  Procedures  . CBC with Differential    Standing Status:   Standing    Number of Occurrences:   20    Standing Expiration Date:   09/04/2017  . Comprehensive metabolic panel     Standing Status:   Standing    Number of Occurrences:   20    Standing Expiration Date:   09/04/2017  . ECHOCARDIOGRAM LIMITED    Standing Status:   Future    Standing Expiration Date:   12/04/2017    Order Specific Question:   Where should this test be performed    Answer:   [place]    Order Specific Question:   Perflutren DEFINITY (image enhancing agent) should be administered unless hypersensitivity or allergy exist  Answer:   Do NOT administer Perflutren    Order Specific Question:   Reason for no Perflutren    Answer:   Other    Order Specific Question:   Specify other    Answer:   chemo    Order Specific Question:   Expected Date:    Answer:   1 week   All questions were answered. The patient knows to call the clinic with any problems, questions or concerns. No barriers to learning was detected. I spent 30 minutes counseling the patient face to face. The total time spent in the appointment was 40 minutes and more than 50% was on counseling and review of test results     [name], MD 09/03/2016 2:45 PM

## 2016-09-04 ENCOUNTER — Ambulatory Visit (HOSPITAL_COMMUNITY)
Admission: RE | Admit: 2016-09-04 | Discharge: 2016-09-04 | Disposition: A | Discharge status: Home / Self Care | Payer: Medicare Other | Source: Ambulatory Visit | Attending: Hematology and Oncology | Admitting: Hematology and Oncology

## 2016-09-04 ENCOUNTER — Telehealth: Payer: Self-pay

## 2016-09-04 DIAGNOSIS — C482 Malignant neoplasm of peritoneum, unspecified: Secondary | ICD-10-CM | POA: Insufficient documentation

## 2016-09-04 DIAGNOSIS — C569 Malignant neoplasm of unspecified ovary: Secondary | ICD-10-CM

## 2016-09-04 DIAGNOSIS — I081 Rheumatic disorders of both mitral and tricuspid valves: Secondary | ICD-10-CM | POA: Diagnosis not present

## 2016-09-04 NOTE — Telephone Encounter (Signed)
Called with below message. 

## 2016-09-04 NOTE — Progress Notes (Signed)
  Echocardiogram 2D Echocardiogram has been performed. Technically difficult study due to patient breast implants. Unable to accurately assess apical window due to poor visualization.  Anyelina Claycomb L Androw 09/04/2016, 10:20 AM

## 2016-09-04 NOTE — Telephone Encounter (Signed)
-----   Message from [name], MD sent at 09/04/2016 11:06 AM EDT ----- Regarding: result Pls let her know ECHO is normal ----- Message ----- From: Interface, Rad Results In Sent: 09/04/2016  10:56 AM To: [name], MD

## 2016-09-11 ENCOUNTER — Encounter: Payer: Self-pay | Admitting: Pharmacist

## 2016-09-12 ENCOUNTER — Ambulatory Visit: Payer: Medicare Other

## 2016-09-12 ENCOUNTER — Ambulatory Visit (HOSPITAL_BASED_OUTPATIENT_CLINIC_OR_DEPARTMENT_OTHER): Payer: Medicare Other

## 2016-09-12 ENCOUNTER — Other Ambulatory Visit (HOSPITAL_BASED_OUTPATIENT_CLINIC_OR_DEPARTMENT_OTHER): Payer: Medicare Other

## 2016-09-12 VITALS — BP 126/52 | HR 61 | Temp 98.4°F | Resp 18

## 2016-09-12 DIAGNOSIS — Z5112 Encounter for antineoplastic immunotherapy: Secondary | ICD-10-CM

## 2016-09-12 DIAGNOSIS — C482 Malignant neoplasm of peritoneum, unspecified: Secondary | ICD-10-CM | POA: Diagnosis present

## 2016-09-12 DIAGNOSIS — C569 Malignant neoplasm of unspecified ovary: Secondary | ICD-10-CM

## 2016-09-12 DIAGNOSIS — Z5111 Encounter for antineoplastic chemotherapy: Secondary | ICD-10-CM | POA: Diagnosis not present

## 2016-09-12 LAB — CBC WITH DIFFERENTIAL/PLATELET
BASO%: 0.2 % (ref 0.0–2.0)
Basophils Absolute: 0 10*3/uL (ref 0.0–0.1)
EOS%: 2.2 % (ref 0.0–7.0)
Eosinophils Absolute: 0.1 10*3/uL (ref 0.0–0.5)
HCT: 35.7 % (ref 34.8–46.6)
HGB: 11.6 g/dL (ref 11.6–15.9)
LYMPH%: 34.7 % (ref 14.0–49.7)
MCH: 29.1 pg (ref 25.1–34.0)
MCHC: 32.5 g/dL (ref 31.5–36.0)
MCV: 89.5 fL (ref 79.5–101.0)
MONO#: 0.2 10*3/uL (ref 0.1–0.9)
MONO%: 4.1 % (ref 0.0–14.0)
NEUT#: 2.7 10*3/uL (ref 1.5–6.5)
NEUT%: 58.8 % (ref 38.4–76.8)
Platelets: 89 10*3/uL — ABNORMAL LOW (ref 145–400)
RBC: 3.99 10*6/uL (ref 3.70–5.45)
RDW: 20.9 % — ABNORMAL HIGH (ref 11.2–14.5)
WBC: 4.6 10*3/uL (ref 3.9–10.3)
lymph#: 1.6 10*3/uL (ref 0.9–3.3)
nRBC: 0 % (ref 0–0)

## 2016-09-12 LAB — COMPREHENSIVE METABOLIC PANEL
ALT: 62 U/L — ABNORMAL HIGH (ref 0–55)
AST: 39 U/L — ABNORMAL HIGH (ref 5–34)
Albumin: 3.7 g/dL (ref 3.5–5.0)
Alkaline Phosphatase: 117 U/L (ref 40–150)
Anion Gap: 9 mEq/L (ref 3–11)
BUN: 12.8 mg/dL (ref 7.0–26.0)
CO2: 26 mEq/L (ref 22–29)
Calcium: 8.7 mg/dL (ref 8.4–10.4)
Chloride: 108 mEq/L (ref 98–109)
Creatinine: 0.8 mg/dL (ref 0.6–1.1)
EGFR: 75 mL/min/{1.73_m2} — ABNORMAL LOW (ref >90)
Glucose: 130 mg/dl (ref 70–140)
Potassium: 3.5 mEq/L (ref 3.5–5.1)
Sodium: 143 mEq/L (ref 136–145)
Total Bilirubin: 0.42 mg/dL (ref 0.20–1.20)
Total Protein: 6.9 g/dL (ref 6.4–8.3)

## 2016-09-12 LAB — UA PROTEIN, DIPSTICK - CHCC: Protein, ur: NEGATIVE mg/dL

## 2016-09-12 MED ORDER — SODIUM CHLORIDE 0.9 % IV SOLN
INTRAVENOUS | Status: DC
  Administered 2016-09-12: 14:00:00 via INTRAVENOUS

## 2016-09-12 MED ORDER — DOXORUBICIN HCL LIPOSOMAL CHEMO INJECTION 2 MG/ML
31.0000 mg/m2 | Freq: Once | INTRAVENOUS | Status: AC
  Administered 2016-09-12: 60 mg via INTRAVENOUS
  Filled 2016-09-12: qty 30

## 2016-09-12 MED ORDER — HEPARIN SOD (PORK) LOCK FLUSH 100 UNIT/ML IV SOLN
500.0000 [IU] | Freq: Once | INTRAVENOUS | Status: AC | PRN
  Administered 2016-09-12: 500 [IU]
  Filled 2016-09-12: qty 5

## 2016-09-12 MED ORDER — DEXAMETHASONE SODIUM PHOSPHATE 10 MG/ML IJ SOLN
10.0000 mg | Freq: Once | INTRAMUSCULAR | Status: AC
  Administered 2016-09-12: 10 mg via INTRAVENOUS

## 2016-09-12 MED ORDER — SODIUM CHLORIDE 0.9 % IV SOLN
10.2000 mg/kg | Freq: Once | INTRAVENOUS | Status: AC
  Administered 2016-09-12: 900 mg via INTRAVENOUS
  Filled 2016-09-12: qty 32

## 2016-09-12 MED ORDER — SODIUM CHLORIDE 0.9% FLUSH
10.0000 mL | INTRAVENOUS | Status: DC | PRN
  Administered 2016-09-12: 10 mL
  Filled 2016-09-12: qty 10

## 2016-09-12 MED ORDER — SODIUM CHLORIDE 0.9% FLUSH
10.0000 mL | Freq: Once | INTRAVENOUS | Status: AC
  Administered 2016-09-12: 10 mL
  Filled 2016-09-12: qty 10

## 2016-09-12 MED ORDER — DEXTROSE 5 % IV SOLN
Freq: Once | INTRAVENOUS | Status: AC
  Administered 2016-09-12: 16:00:00 via INTRAVENOUS

## 2016-09-12 MED ORDER — DEXAMETHASONE SODIUM PHOSPHATE 10 MG/ML IJ SOLN
INTRAMUSCULAR | Status: AC
  Filled 2016-09-12: qty 1

## 2016-09-12 NOTE — Patient Instructions (Signed)
[place] Discharge Instructions for Patients Receiving Chemotherapy  Today you received the following chemotherapy agents: Avastin and Doxil   To help prevent nausea and vomiting after your treatment, we encourage you to take your nausea medication as directed.    If you develop nausea and vomiting that is not controlled by your nausea medication, call the clinic.   BELOW ARE SYMPTOMS THAT SHOULD BE REPORTED IMMEDIATELY:  *FEVER GREATER THAN 100.5 F  *CHILLS WITH OR WITHOUT FEVER  NAUSEA AND VOMITING THAT IS NOT CONTROLLED WITH YOUR NAUSEA MEDICATION  *UNUSUAL SHORTNESS OF BREATH  *UNUSUAL BRUISING OR BLEEDING  TENDERNESS IN MOUTH AND THROAT WITH OR WITHOUT PRESENCE OF ULCERS  *URINARY PROBLEMS  *BOWEL PROBLEMS  UNUSUAL RASH Items with * indicate a potential emergency and should be followed up as soon as possible.  Feel free to call the clinic you have any questions or concerns. The clinic phone number is [phone].  Please show the [place] at check-in to the Emergency Department and triage nurse.    Bevacizumab injection What is this medicine? BEVACIZUMAB (be va SIZ yoo mab) is a monoclonal antibody. It is used to treat many types of cancer. This medicine may be used for other purposes; ask your health care provider or pharmacist if you have questions. COMMON BRAND NAME(S): Avastin What should I tell my health care provider before I take this medicine? They need to know if you have any of these conditions: -diabetes -heart disease -high blood pressure -history of coughing up blood -prior anthracycline chemotherapy (e.g., doxorubicin, daunorubicin, epirubicin) -recent or ongoing radiation therapy -recent or planning to have surgery -stroke -an unusual or allergic reaction to bevacizumab, hamster proteins, mouse proteins, other medicines, foods, dyes, or preservatives -pregnant or trying to get pregnant -breast-feeding How should I  use this medicine? This medicine is for infusion into a vein. It is given by a health care professional in a hospital or clinic setting. Talk to your pediatrician regarding the use of this medicine in children. Special care may be needed. Overdosage: If you think you have taken too much of this medicine contact a poison control center or emergency room at once. NOTE: This medicine is only for you. Do not share this medicine with others. What if I miss a dose? It is important not to miss your dose. Call your doctor or health care professional if you are unable to keep an appointment. What may interact with this medicine? Interactions are not expected. This list may not describe all possible interactions. Give your health care provider a list of all the medicines, herbs, non-prescription drugs, or dietary supplements you use. Also tell them if you smoke, drink alcohol, or use illegal drugs. Some items may interact with your medicine. What should I watch for while using this medicine? Your condition will be monitored carefully while you are receiving this medicine. You will need important blood work and urine testing done while you are taking this medicine. This medicine may increase your risk to bruise or bleed. Call your doctor or health care professional if you notice any unusual bleeding. This medicine should be started at least 28 days following major surgery and the site of the surgery should be totally healed. Check with your doctor before scheduling dental work or surgery while you are receiving this treatment. Talk to your doctor if you have recently had surgery or if you have a wound that has not healed. Do not become pregnant while taking this  medicine or for 6 months after stopping it. Women should inform their doctor if they wish to become pregnant or think they might be pregnant. There is a potential for serious side effects to an unborn child. Talk to your health care professional or  pharmacist for more information. Do not breast-feed an infant while taking this medicine and for 6 months after the last dose. This medicine has caused ovarian failure in some women. This medicine may interfere with the ability to have a child. You should talk to your doctor or health care professional if you are concerned about your fertility. What side effects may I notice from receiving this medicine? Side effects that you should report to your doctor or health care professional as soon as possible: -allergic reactions like skin rash, itching or hives, swelling of the face, lips, or tongue -chest pain or chest tightness -chills -coughing up blood -high fever -seizures -severe constipation -signs and symptoms of bleeding such as bloody or black, tarry stools; red or dark-brown urine; spitting up blood or brown material that looks like coffee grounds; red spots on the skin; unusual bruising or bleeding from the eye, gums, or nose -signs and symptoms of a blood clot such as breathing problems; chest pain; severe, sudden headache; pain, swelling, warmth in the leg -signs and symptoms of a stroke like changes in vision; confusion; trouble speaking or understanding; severe headaches; sudden numbness or weakness of the face, arm or leg; trouble walking; dizziness; loss of balance or coordination -stomach pain -sweating -swelling of legs or ankles -vomiting -weight gain Side effects that usually do not require medical attention (report to your doctor or health care professional if they continue or are bothersome): -back pain -changes in taste -decreased appetite -dry skin -nausea -tiredness This list may not describe all possible side effects. Call your doctor for medical advice about side effects. You may report side effects to FDA at 1-800-FDA-1088. Where should I keep my medicine? This drug is given in a hospital or clinic and will not be stored at home. NOTE: This sheet is a summary. It  may not cover all possible information. If you have questions about this medicine, talk to your doctor, pharmacist, or health care provider.  2018 Elsevier/Gold Standard (2016-01-26 14:33:29)  Doxorubicin Liposomal injection What is this medicine? LIPOSOMAL DOXORUBICIN (LIP oh som al dox oh ROO bi sin) is a chemotherapy drug. This medicine is used to treat many kinds of cancer like Kaposi's sarcoma, multiple myeloma, and ovarian cancer. This medicine may be used for other purposes; ask your health care provider or pharmacist if you have questions. COMMON BRAND NAME(S): Doxil, Lipodox What should I tell my health care provider before I take this medicine? They need to know if you have any of these conditions: -blood disorders -heart disease -infection (especially a virus infection such as chickenpox, cold sores, or herpes) -liver disease -recent or ongoing radiation therapy -an unusual or allergic reaction to doxorubicin, other chemotherapy agents, soybeans, other medicines, foods, dyes, or preservatives -pregnant or trying to get pregnant -breast-feeding How should I use this medicine? This drug is given as an infusion into a vein. It is administered in a hospital or clinic by a specially trained health care professional. If you have pain, swelling, burning or any unusual feeling around the site of your injection, tell your health care professional right away. Talk to your pediatrician regarding the use of this medicine in children. Special care may be needed. Overdosage: If you think you  have taken too much of this medicine contact a poison control center or emergency room at once. NOTE: This medicine is only for you. Do not share this medicine with others. What if I miss a dose? It is important not to miss your dose. Call your doctor or health care professional if you are unable to keep an appointment. What may interact with this medicine? Do not take this medicine with any of the  following medications: -zidovudine This medicine may also interact with the following medications: -medicines to increase blood counts like filgrastim, pegfilgrastim, sargramostim -vaccines Talk to your doctor or health care professional before taking any of these medicines: -acetaminophen -aspirin -ibuprofen -ketoprofen -naproxen This list may not describe all possible interactions. Give your health care provider a list of all the medicines, herbs, non-prescription drugs, or dietary supplements you use. Also tell them if you smoke, drink alcohol, or use illegal drugs. Some items may interact with your medicine. What should I watch for while using this medicine? Your condition will be monitored carefully while you are receiving this medicine. You will need important blood work done while you are taking this medicine. This drug may make you feel generally unwell. This is not uncommon, as chemotherapy can affect healthy cells as well as cancer cells. Report any side effects. Continue your course of treatment even though you feel ill unless your doctor tells you to stop. Your urine may turn orange-red for a few days after your dose. This is not blood. If your urine is dark or brown, call your doctor. In some cases, you may be given additional medicines to help with side effects. Follow all directions for their use. Call your doctor or health care professional for advice if you get a fever (100.5 degrees F or higher), chills or sore throat, or other symptoms of a cold or flu. Do not treat yourself. This drug decreases your body's ability to fight infections. Try to avoid being around people who are sick. This medicine may increase your risk to bruise or bleed. Call your doctor or health care professional if you notice any unusual bleeding. Be careful brushing and flossing your teeth or using a toothpick because you may get an infection or bleed more easily. If you have any dental work done, tell your  dentist you are receiving this medicine. Avoid taking products that contain aspirin, acetaminophen, ibuprofen, naproxen, or ketoprofen unless instructed by your doctor. These medicines may hide a fever. Men and women of childbearing age should use effective birth control methods while using taking this medicine. Do not become pregnant while taking this medicine. There is a potential for serious side effects to an unborn child. Talk to your health care professional or pharmacist for more information. Do not breast-feed an infant while taking this medicine. Talk to your doctor about your risk of cancer. You may be more at risk for certain types of cancers if you take this medicine. What side effects may I notice from receiving this medicine? Side effects that you should report to your doctor or health care professional as soon as possible: -allergic reactions like skin rash, itching or hives, swelling of the face, lips, or tongue -low blood counts - this medicine may decrease the number of white blood cells, red blood cells and platelets. You may be at increased risk for infections and bleeding. -signs of hand-foot syndrome - tingling or burning, redness, flaking, swelling, small blisters, or small sores on the palms of your hands or the soles  of your feet -signs of infection - fever or chills, cough, sore throat, pain or difficulty passing urine -signs of decreased platelets or bleeding - bruising, pinpoint red spots on the skin, black, tarry stools, blood in the urine -signs of decreased red blood cells - unusually weak or tired, fainting spells, lightheadedness -back pain, chills, facial flushing, fever, headache, tightness in the chest or throat during the infusion -breathing problems -chest pain -fast, irregular heartbeat -mouth pain, redness, sores -pain, swelling, redness at site where injected -pain, tingling, numbness in the hands or feet -swelling of ankles, feet, or hands -vomiting Side  effects that usually do not require medical attention (report to your doctor or health care professional if they continue or are bothersome): -diarrhea -hair loss -loss of appetite -nail discoloration or damage -nausea -red or watery eyes -red colored urine -stomach upset This list may not describe all possible side effects. Call your doctor for medical advice about side effects. You may report side effects to FDA at 1-800-FDA-1088. Where should I keep my medicine? This drug is given in a hospital or clinic and will not be stored at home. NOTE: This sheet is a summary. It may not cover all possible information. If you have questions about this medicine, talk to your doctor, pharmacist, or health care provider.  2018 Elsevier/Gold Standard (2011-10-18 10:12:56)

## 2016-09-12 NOTE — Patient Instructions (Signed)

## 2016-09-12 NOTE — Progress Notes (Signed)
Per Dr. Alen Blew okay to proceed with treatment with platelets of 89. Pt educated to monitor for s/s of bleeding and to call clinic with any concerns or s/s of bleeding, or to go to ER for any emergency.  Pt reports "tingling" in bilateral hands pt repositioned and pt reports tingling is getting better. 1543: Tingling resolved.

## 2016-09-13 ENCOUNTER — Telehealth: Payer: Self-pay

## 2016-09-13 LAB — CA 125: Cancer Antigen (CA) 125: 98.5 U/mL — ABNORMAL HIGH (ref 0.0–38.1)

## 2016-09-13 NOTE — Telephone Encounter (Signed)
Called patient to see how she is doing after first time Avastin and Doxil yesterday. She said she is doing well, did not sleep well last night due to the steroids. She asked what her CA 125 was while on the phone, results given to patient.

## 2016-09-13 NOTE — Telephone Encounter (Signed)
-----   Message from Egbert Garibaldi, RN sent at 09/12/2016  2:59 PM EDT ----- Regarding: Alvy Bimler chemo f/u call  Pt of Dr. Alvy Bimler, First time Avastin and Doxil.

## 2016-09-26 ENCOUNTER — Encounter: Payer: Self-pay | Admitting: Hematology and Oncology

## 2016-09-26 ENCOUNTER — Other Ambulatory Visit (HOSPITAL_BASED_OUTPATIENT_CLINIC_OR_DEPARTMENT_OTHER): Payer: Medicare Other

## 2016-09-26 ENCOUNTER — Ambulatory Visit (HOSPITAL_BASED_OUTPATIENT_CLINIC_OR_DEPARTMENT_OTHER): Payer: Medicare Other | Admitting: Hematology and Oncology

## 2016-09-26 ENCOUNTER — Ambulatory Visit (HOSPITAL_BASED_OUTPATIENT_CLINIC_OR_DEPARTMENT_OTHER): Payer: Medicare Other

## 2016-09-26 ENCOUNTER — Ambulatory Visit: Payer: Medicare Other

## 2016-09-26 VITALS — BP 135/62 | HR 72 | Temp 97.7°F | Resp 18 | Ht 61.5 in | Wt 189.0 lb

## 2016-09-26 VITALS — BP 117/66

## 2016-09-26 DIAGNOSIS — C482 Malignant neoplasm of peritoneum, unspecified: Secondary | ICD-10-CM

## 2016-09-26 DIAGNOSIS — D649 Anemia, unspecified: Secondary | ICD-10-CM

## 2016-09-26 DIAGNOSIS — Z5112 Encounter for antineoplastic immunotherapy: Secondary | ICD-10-CM

## 2016-09-26 DIAGNOSIS — K7581 Nonalcoholic steatohepatitis (NASH): Secondary | ICD-10-CM

## 2016-09-26 DIAGNOSIS — C569 Malignant neoplasm of unspecified ovary: Secondary | ICD-10-CM

## 2016-09-26 DIAGNOSIS — D61818 Other pancytopenia: Secondary | ICD-10-CM | POA: Diagnosis not present

## 2016-09-26 DIAGNOSIS — K7460 Unspecified cirrhosis of liver: Secondary | ICD-10-CM

## 2016-09-26 LAB — CBC WITH DIFFERENTIAL/PLATELET
BASO%: 0.6 % (ref 0.0–2.0)
Basophils Absolute: 0 10*3/uL (ref 0.0–0.1)
EOS%: 1.8 % (ref 0.0–7.0)
Eosinophils Absolute: 0.1 10*3/uL (ref 0.0–0.5)
HCT: 32.5 % — ABNORMAL LOW (ref 34.8–46.6)
HGB: 10.7 g/dL — ABNORMAL LOW (ref 11.6–15.9)
LYMPH%: 30.9 % (ref 14.0–49.7)
MCH: 30.3 pg (ref 25.1–34.0)
MCHC: 33 g/dL (ref 31.5–36.0)
MCV: 91.9 fL (ref 79.5–101.0)
MONO#: 0.1 10*3/uL (ref 0.1–0.9)
MONO%: 3.5 % (ref 0.0–14.0)
NEUT#: 2.4 10*3/uL (ref 1.5–6.5)
NEUT%: 63.2 % (ref 38.4–76.8)
Platelets: 110 10*3/uL — ABNORMAL LOW (ref 145–400)
RBC: 3.54 10*6/uL — ABNORMAL LOW (ref 3.70–5.45)
RDW: 24.5 % — ABNORMAL HIGH (ref 11.2–14.5)
WBC: 3.8 10*3/uL — ABNORMAL LOW (ref 3.9–10.3)
lymph#: 1.2 10*3/uL (ref 0.9–3.3)

## 2016-09-26 LAB — COMPREHENSIVE METABOLIC PANEL
ALT: 96 U/L — ABNORMAL HIGH (ref 0–55)
AST: 71 U/L — ABNORMAL HIGH (ref 5–34)
Albumin: 3.5 g/dL (ref 3.5–5.0)
Alkaline Phosphatase: 115 U/L (ref 40–150)
Anion Gap: 10 mEq/L (ref 3–11)
BUN: 16.9 mg/dL (ref 7.0–26.0)
CO2: 23 mEq/L (ref 22–29)
Calcium: 8.7 mg/dL (ref 8.4–10.4)
Chloride: 108 mEq/L (ref 98–109)
Creatinine: 0.8 mg/dL (ref 0.6–1.1)
EGFR: 76 mL/min/{1.73_m2} — ABNORMAL LOW (ref >90)
Glucose: 112 mg/dl (ref 70–140)
Potassium: 3.8 mEq/L (ref 3.5–5.1)
Sodium: 142 mEq/L (ref 136–145)
Total Bilirubin: 0.44 mg/dL (ref 0.20–1.20)
Total Protein: 6.6 g/dL (ref 6.4–8.3)

## 2016-09-26 MED ORDER — SODIUM CHLORIDE 0.9 % IV SOLN
900.0000 mg | Freq: Once | INTRAVENOUS | Status: AC
  Administered 2016-09-26: 900 mg via INTRAVENOUS
  Filled 2016-09-26: qty 32

## 2016-09-26 MED ORDER — HEPARIN SOD (PORK) LOCK FLUSH 100 UNIT/ML IV SOLN
500.0000 [IU] | Freq: Once | INTRAVENOUS | Status: AC | PRN
  Administered 2016-09-26: 500 [IU]
  Filled 2016-09-26: qty 5

## 2016-09-26 MED ORDER — SODIUM CHLORIDE 0.9% FLUSH
10.0000 mL | INTRAVENOUS | Status: DC | PRN
  Administered 2016-09-26: 10 mL
  Filled 2016-09-26: qty 10

## 2016-09-26 MED ORDER — SODIUM CHLORIDE 0.9 % IV SOLN
Freq: Once | INTRAVENOUS | Status: AC
  Administered 2016-09-26: 13:00:00 via INTRAVENOUS

## 2016-09-26 NOTE — Assessment & Plan Note (Signed)
She has mild anemia likely anemia chronic disease. The leukopenia and thrombocytopenia is likely due to her liver disease. She is not symptomatic.  Recommend close observation only

## 2016-09-26 NOTE — Patient Instructions (Signed)
[place] Discharge Instructions for Patients Receiving Chemotherapy  Today you received the following chemotherapy agents: Avastin. If you develop nausea and vomiting that is not controlled by your nausea medication, call the clinic.   BELOW ARE SYMPTOMS THAT SHOULD BE REPORTED IMMEDIATELY:  *FEVER GREATER THAN 100.5 F  *CHILLS WITH OR WITHOUT FEVER  NAUSEA AND VOMITING THAT IS NOT CONTROLLED WITH YOUR NAUSEA MEDICATION  *UNUSUAL SHORTNESS OF BREATH  *UNUSUAL BRUISING OR BLEEDING  TENDERNESS IN MOUTH AND THROAT WITH OR WITHOUT PRESENCE OF ULCERS  *URINARY PROBLEMS  *BOWEL PROBLEMS  UNUSUAL RASH Items with * indicate a potential emergency and should be followed up as soon as possible.  Feel free to call the clinic should you have any questions or concerns. The clinic phone number is [phone].  Please show the [place] at check-in to the Emergency Department and triage nurse.

## 2016-09-26 NOTE — Assessment & Plan Note (Signed)
So far, she tolerated treatment well except for recent nausea Most of her symptoms had resolved She will continue treatment every other week I will order tumor marker every 4 weeks I will order imaging study after 3 cycles of treatment She would get echocardiogram repeated after cycle #3

## 2016-09-26 NOTE — Progress Notes (Signed)
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  No worrisome liver mass.  There are associated portal venous collaterals and splenomegaly consistent with portal venous hypertension. 2.  No other significant upper abdominal findings      11/08/2013 Imaging    US showed ascites      11/08/2013 Initial Diagnosis    Patient had ~ 2 months of some urinary incontinence, saw Dr Ronita Hipps in 10-2013, with pelvic mass on exam       11/12/2013 Imaging    There are findings of progressive hepatic cirrhosis with a large amount of ascites and mild splenomegaly. There are venous collaterals present. 2. There is abnormal thickening of the peritoneal surface along the left mid and lower abdominal wall. There is abnormal soft tissue density in the pelvis as well which could reflect the clinically suspected ovarian malignancy but the findings are nondiagnostic. 3. There is a new small left pleural effusion. 4. There is no acute bowel abnormality.      11/18/2013 Imaging    Successful ultrasound guided diagnostic and therapeutic paracentesis yielding 4.1 liters of ascites.       11/18/2013 Pathology Results    PERITONEAL/ASCITIC FLUID (SPECIMEN 1 OF 1 COLLECTED 11/18/13): SEROUS CARCINOMA, PLEASE SEE COMMENT.      12/01/2013 Tumor Marker    Patient's tumor was tested for the following markers: CA125 Results of the tumor marker test revealed 1938      12/03/2013 Imaging    Successful ultrasound-guided therapeutic paracentesis yielding 3.6 liters of peritoneal fluid.      12/07/2013 - 05/30/2014 Chemotherapy    She received 3 cycles of carboplatin and Taxol, interrupted cycle 4 for surgery.  She  subsequently completed 3 more cycles of chemotherapy after surgery      12/20/2013 Imaging    Successful ultrasound-guided diagnostic and therapeutic paracentesis yielding 2.2 liters of peritoneal fluid      12/20/2013 Pathology Results    PERITONEAL/ASCITIC FLUID(SPECIMEN 1 OF 1 COLLECTED 12/20/13): MALIGNANT CELLS CONSISTENT WITH SEROUS CARCINOMA      01/13/2014 Tumor Marker    Patient's tumor was tested for the following markers: CA125 Results of the tumor marker test revealed 227      02/07/2014 Tumor Marker    Patient's tumor was tested for the following markers: CA125 Results of the tumor marker test revealed 130      02/15/2014 Genetic Testing    Genetics testing from 02-2014 normal ([employer] panel)      02/23/2014 Imaging    Interval decrease in ascites. 2. Morphologic changes in the liver consistent with cirrhosis. 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 Portal vein remains patent at this time. 3. Persistent but decreased abnormal soft tissue attenuation tracking in the omental fat. This may be secondary to interval improvement in metastatic disease. 4. Peritoneal thickening seen in the left abdomen and pelvis on the previous study has decreased and nearly resolved in the interval. This also suggests interval improvement in metastatic disease.       03/07/2014 Procedure    Ultrasound and fluoroscopically guided right internal jugular single lumen power port catheter insertion. Tip in the SVC/RA junction. Catheter ready for use.      03/17/2014  Tumor Marker    Patient's tumor was tested for the following markers: CA125 Results of the tumor marker test revealed 45      03/29/2014 Pathology Results    1. Omentum, resection for tumor - HIGH GRADE SEROUS CARCINOMA, SEE COMMENT. 2. Ovary and fallopian tube, right - HIGH GRADE SEROUS CARCINOMA, SEE COMMENT. 3. Ovary and fallopian tube, left - HIGH GRADE SEROUS CARCINOMA, SEE  COMMENT. Diagnosis Note 1. Nests and clusters of malignant cells are invading the omental tissue (part #1) with associated fibrosis. The cells are pleomorphic with prominent nucleoli. There are scattered psammoma bodies. The ovaries are atrophic and exhibit multiple foci of surface based invasive carcinoma and associated fibrosis. The fallopian tubes have a few foci of carcinoma, also superficially located. There are no precursor lesions noted in the ovaries or fallopian tubes (the entire tubes and ovaries were submitted for evaluation). While there is some retraction artifact, there are several foci suspicious for lymphovascular invasion. 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 Given the fibrosis, there does appear to be a small amount of treatment effect, however, there is abundant residual tumor.       03/29/2014 Surgery    Procedure(s) Performed: 1. Exploratory laparotomy with bilateral salpingo-oophorectomy, omentectomy radical tumor debulking for ovarian cancer .  Surgeon: Thereasa Solo, MD.  Assistant Surgeon: Lahoma Crocker, M.D. Assistant: (an MD assistant was necessary for tissue manipulation, retraction and positioning due to the complexity of the case and hospital policies).  Operative Findings: 10cm omental cake from hepatic to splenic flexure, densely adherent to transverse colon. Milliary studding of tumor implants (<31m, too numerous in number to count) adherent to the mesentery of the small bowel and small bowel wall. Small volume (100cc) ascites. Small ovaries bilaterally, densely adherent to the pelvic cul de sac. Left ovary and tube densely adherent to sigmoid colon. Cirrhotic liver with hepatomegaly and splenomegaly.     This represented an optimal cytoreduction (R1) with visible disease residual on the bowel wall and mesentery (millial, <348m implants).        04/18/2014 Tumor Marker    Patient's tumor was tested for the following markers: CA125 Results of the tumor marker test revealed 122      05/16/2014 Tumor Marker    Patient's tumor was tested for the following markers: CA125 Results of the tumor marker test revealed 30      06/10/2014 Tumor Marker    Patient's tumor was tested for the following markers: CA125 Results of the tumor marker test revealed 19      07/04/2014 Imaging    Interval improvement in the appearance of peritoneal metastasis secondary to ovarian cancer. 2. 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       08/18/2014 Tumor Marker    Patient's tumor was tested for the following markers: CA125 Results of the tumor marker test revealed 16      10/13/2014 Tumor Marker    Patient's tumor was tested for the following markers: CA125 Results of the tumor marker test revealed 14      11/21/2014 Tumor Marker    Patient's tumor was tested for the following markers: CA125 Results of the tumor marker test revealed 16      12/28/2014 Tumor Marker    Patient's tumor was tested for the following markers: CA125 Results of the tumor marker test revealed 762  02/27/2015 Tumor Marker    Patient's tumor was tested for the following markers: CA125 Results of the tumor marker test revealed 20.2      03/22/2015 Imaging    Filler appearance to the prior exam, with very subtle fluid tracking along portions of the liver, and very minimal nodularity along the right paracolic gutter representing residua from the prior peritoneal cancer. The current abnormalities could simply be post therapy findings rather than necessarily representing residual malignancy. No new or enlarging lesions are identified. 2. Hepatic cirrhosis and splenomegaly. There is some gastric varices suggesting portal venous hypertension. 3. Left foraminal stenosis at L4-5 due to spurring. There is likely also some central narrowing  of the thecal sac at this level. 4. Bibasilar scarring. 5. Chronic mild left mid kidney scarring      03/27/2015 Tumor Marker    Patient's tumor was tested for the following markers: CA125 Results of the tumor marker test revealed 25.3      04/24/2015 Imaging    Disc bulge L2-3 with mild spinal stenosis and right foraminal encroachment. 2. Disc bulge L3-4 with mild spinal stenosis. 3. Multifactorial spinal, left lateral recess and foraminal stenosis L4-5. There is grade 1 anterolisthesis without evident dynamic instability.      05/29/2015 Tumor Marker    Patient's tumor was tested for the following markers: CA125 Results of the tumor marker test revealed 29.9      08/21/2015 Tumor Marker    Patient's tumor was tested for the following markers: CA125 Results of the tumor marker test revealed 45      09/18/2015 Tumor Marker    Patient's tumor was tested for the following markers: CA125 Results of the tumor marker test revealed 47      09/27/2015 Imaging    New small bowel mesenteric nodule and enlarged left external iliac lymph node, highly worrisome for metastatic disease. 2. Cirrhosis and splenomegaly      10/10/2015 Tumor Marker    Patient's tumor was tested for the following markers: CA125 Results of the tumor marker test revealed 53.4      10/10/2015 - 12/11/2015 Chemotherapy    She received 4 cycles of carboplatin only      11/20/2015 Tumor Marker    Patient's tumor was tested for the following markers: CA125 Results of the tumor marker test revealed 41.3      12/20/2015 Imaging    CT: Mild left external iliac lymphadenopathy is slightly decreased. Mildly enlarged lower mesenteric nodule is slightly decreased. 2. No new or progressive metastatic disease in the abdomen or pelvis. 3. Cirrhosis. Stable subcentimeter hypodense left liver lobe lesion. No new liver lesions. 4. Stable mild splenomegaly.  No ascites. 5. Mild sigmoid diverticulosis.      01/01/2016 -  Anti-estrogen  oral therapy    She was placed on tamoxifen. Had letrozole initially but switched to tamoxifen due to poor tolerance      01/15/2016 Tumor Marker    Patient's tumor was tested for the following markers: CA125 Results of the tumor marker test revealed 31.4      02/19/2016 Tumor Marker    Patient's tumor was tested for the following markers: CA125 Results of the tumor marker test revealed 36.5      05/13/2016 Imaging    No evidence of disease progression within the abdomen or pelvis. Previously noted small central mesenteric nodule and left external iliac node appear slightly smaller. 2. Stable changes of hepatic cirrhosis and portal hypertension with associated splenomegaly. No new or  enlarging hepatic lesions are identified. 3. No acute findings.      05/13/2016 Tumor Marker    Patient's tumor was tested for the following markers: CA125 Results of the tumor marker test revealed 41.2      06/24/2016 Tumor Marker    Patient's tumor was tested for the following markers: CA125 Results of the tumor marker test revealed 47.3      08/20/2016 Imaging    1. The left external iliac lymph node is slightly increased in size compared to the prior exam, currently 1.1 cm and previously 0.9 cm in diameter. 2. Stable central mesenteric lymph node at 0.9 cm in diameter. 3. Stable trace free pelvic fluid and stable slight thickening along the right paracolic gutter, without well-defined peritoneal nodularity. 4. Other imaging findings of potential clinical significance: Subsegmental atelectasis or scarring in the lung bases. Hepatic cirrhosis. Left renal scarring. Mild splenomegaly. Sigmoid colon diverticulosis. Impingement at L4-5 due to spondylosis and degenerative disc disease broad Schmorl' s nodes at L3-L4. Pelvic floor laxity      08/23/2016 Tumor Marker    Patient's tumor was tested for the following markers: CA125 Results of the tumor marker test revealed 80.2      09/04/2016 Imaging    LV EF: 60%  -   65%      09/12/2016 Tumor Marker    Patient's tumor was tested for the following markers: CA125 Results of the tumor marker test revealed 98.5      09/12/2016 -  Chemotherapy    The patient received Doxil and Avastin       INTERVAL HISTORY: Please see below for problem oriented charting. She returns for further follow-up After chemotherapy recently, she had significant nausea vomiting She took anti-emetics and her symptoms subsequently resolved Denies chest pain, shortness of breath or leg swelling Neuropathy is the same The patient denies any recent signs or symptoms of bleeding such as spontaneous epistaxis, hematuria or hematochezia.   REVIEW OF SYSTEMS:   Constitutional: Denies fevers, chills or abnormal weight loss Eyes: Denies blurriness of vision Ears, nose, mouth, throat, and face: Denies mucositis or sore throat Respiratory: Denies cough, dyspnea or wheezes Cardiovascular: Denies palpitation, chest discomfort or lower extremity swelling Skin: Denies abnormal skin rashes Lymphatics: Denies new lymphadenopathy or easy bruising Neurological:Denies numbness, tingling or new weaknesses Behavioral/Psych: Mood is stable, no new changes  All other systems were reviewed with the patient and are negative.  I have reviewed the past medical history, past surgical history, social history and family history with the patient and they are unchanged from previous note.  ALLERGIES:  is allergic to shellfish allergy; benadryl [diphenhydramine hcl]; penicillins; and tape.  MEDICATIONS:  Current Outpatient Prescriptions  Medication Sig Dispense Refill  . amLODipine (NORVASC) 5 MG tablet Take 5 mg by mouth daily.  0  . aspirin EC 81 MG tablet Take 81 mg by mouth daily.    . bisoprolol (ZEBETA) 5 MG tablet   0  . cholecalciferol (VITAMIN D) 1000 units tablet Take 1,000 Units by mouth daily.    Marland Kitchen gabapentin (NEURONTIN) 300 MG capsule Take 2 capsules (600 mg total) by mouth 2 (two)  times daily. 90 capsule 3  . HYDROcodone-acetaminophen (NORCO/VICODIN) 5-325 MG tablet take 1 tablet by mouth every 6 hours if needed for MODERATE PAIN 60 tablet 0  . insulin degludec (TRESIBA) 100 UNIT/ML SOPN FlexTouch Pen Inject 13 Units into the skin daily.     Marland Kitchen lidocaine-prilocaine (EMLA) cream Apply to Porta-cath  1-2  hours prior to access as directed. 30 g 1  . metFORMIN (GLUCOPHAGE-XR) 500 MG 24 hr tablet Take 1,000 mg by mouth 2 (two) times daily.   0  . pantoprazole (PROTONIX) 40 MG tablet Take 40 mg by mouth daily.   0  . rotigotine (NEUPRO) 4 MG/24HR Place 1 patch onto the skin daily. 30 patch 5   No current facility-administered medications for this visit.     PHYSICAL EXAMINATION: ECOG PERFORMANCE STATUS: 1 - Symptomatic but completely ambulatory  Vitals:   09/26/16 1020  BP: 135/62  Pulse: 72  Resp: 18  Temp: 97.7 F (36.5 C)  SpO2: 100%   Filed Weights   09/26/16 1020  Weight: 189 lb (85.7 kg)    GENERAL:alert, no distress and comfortable SKIN: skin color, texture, turgor are normal, no rashes or significant lesions EYES: normal, Conjunctiva are pink and non-injected, sclera clear OROPHARYNX:no exudate, no erythema and lips, buccal mucosa, and tongue normal  NECK: supple, thyroid normal size, non-tender, without nodularity LYMPH:  no palpable lymphadenopathy in the cervical, axillary or inguinal LUNGS: clear to auscultation and percussion with normal breathing effort HEART: regular rate & rhythm and no murmurs and no lower extremity edema ABDOMEN:abdomen soft, non-tender and normal bowel sounds Musculoskeletal:no cyanosis of digits and no clubbing  NEURO: alert & oriented x 3 with fluent speech, no focal motor/sensory deficits  LABORATORY DATA:  I have reviewed the data as listed    Component Value Date/Time   NA 142 09/26/2016 0949   K 3.8 09/26/2016 0949   CL 105 03/30/2014 0618   CO2 23 09/26/2016 0949   GLUCOSE 112 09/26/2016 0949   BUN 16.9  09/26/2016 0949   CREATININE 0.8 09/26/2016 0949   CALCIUM 8.7 09/26/2016 0949   PROT 6.6 09/26/2016 0949   ALBUMIN 3.5 09/26/2016 0949   AST 71 (H) 09/26/2016 0949   ALT 96 (H) 09/26/2016 0949   ALKPHOS 115 09/26/2016 0949   BILITOT 0.44 09/26/2016 0949   GFRNONAA 86 (L) 03/30/2014 0618   GFRAA >90 03/30/2014 0618    No results found for: SPEP, UPEP  Lab Results  Component Value Date   WBC 3.8 (L) 09/26/2016   NEUTROABS 2.4 09/26/2016   HGB 10.7 (L) 09/26/2016   HCT 32.5 (L) 09/26/2016   MCV 91.9 09/26/2016   PLT 110 (L) 09/26/2016      Chemistry      Component Value Date/Time   NA 142 09/26/2016 0949   K 3.8 09/26/2016 0949   CL 105 03/30/2014 0618   CO2 23 09/26/2016 0949   BUN 16.9 09/26/2016 0949   CREATININE 0.8 09/26/2016 0949      Component Value Date/Time   CALCIUM 8.7 09/26/2016 0949   ALKPHOS 115 09/26/2016 0949   AST 71 (H) 09/26/2016 0949   ALT 96 (H) 09/26/2016 0949   BILITOT 0.44 09/26/2016 0949      ASSESSMENT & PLAN:  Primary peritoneal carcinomatosis ([facility]) So far, she tolerated treatment well except for recent nausea Most of her symptoms had resolved She will continue treatment every other week I will order tumor marker every 4 weeks I will order imaging study after 3 cycles of treatment She would get echocardiogram repeated after cycle #3  Pancytopenia, acquired ([place]) She has mild anemia likely anemia chronic disease. The leukopenia and thrombocytopenia is likely due to her liver disease. She is not symptomatic.  Recommend close observation only  Liver cirrhosis secondary to [facility]) She has baseline mild liver cirrhosis I  recommend minor dose adjustment to chemotherapy to reduce the risk of toxicity especially with morbid obesity   Orders Placed This Encounter  Procedures  . UA Protein, Dipstick - [facility]    Standing Status:   Standing    Number of Occurrences:   11    Standing Expiration Date:   09/26/2017  . CA 125    Standing  Status:   Standing    Number of Occurrences:   22    Standing Expiration Date:   09/26/2017   All questions were answered. The patient knows to call the clinic with any problems, questions or concerns. No barriers to learning was detected. I spent 15 minutes counseling the patient face to face. The total time spent in the appointment was 20 minutes and more than 50% was on counseling and review of test results     [name], MD 09/26/2016 11:55 AM

## 2016-09-26 NOTE — Assessment & Plan Note (Signed)
She has baseline mild liver cirrhosis I recommend minor dose adjustment to chemotherapy to reduce the risk of toxicity especially with morbid obesity

## 2016-09-27 ENCOUNTER — Telehealth: Payer: Self-pay

## 2016-09-27 LAB — CA 125: Cancer Antigen (CA) 125: 68.9 U/mL — ABNORMAL HIGH (ref 0.0–38.1)

## 2016-09-27 NOTE — Telephone Encounter (Signed)
-----   Message from [name], MD sent at 09/27/2016  6:49 AM EDT ----- Regarding: labs pls let her know tumor marker is better ----- Message ----- From: Interface, Lab In Three Zero One Sent: 09/26/2016  10:20 AM To: [name], MD

## 2016-09-27 NOTE — Telephone Encounter (Signed)
Called with below message. 

## 2016-10-07 ENCOUNTER — Other Ambulatory Visit: Payer: Medicare Other

## 2016-10-07 ENCOUNTER — Ambulatory Visit: Payer: Medicare Other | Admitting: Hematology and Oncology

## 2016-10-10 ENCOUNTER — Other Ambulatory Visit: Payer: Self-pay | Admitting: *Deleted

## 2016-10-10 ENCOUNTER — Ambulatory Visit (HOSPITAL_BASED_OUTPATIENT_CLINIC_OR_DEPARTMENT_OTHER): Payer: Medicare Other

## 2016-10-10 ENCOUNTER — Other Ambulatory Visit: Payer: Self-pay | Admitting: Hematology and Oncology

## 2016-10-10 ENCOUNTER — Ambulatory Visit: Payer: Medicare Other

## 2016-10-10 ENCOUNTER — Other Ambulatory Visit (HOSPITAL_BASED_OUTPATIENT_CLINIC_OR_DEPARTMENT_OTHER): Payer: Medicare Other

## 2016-10-10 VITALS — BP 143/62 | HR 61 | Temp 98.0°F

## 2016-10-10 DIAGNOSIS — C482 Malignant neoplasm of peritoneum, unspecified: Secondary | ICD-10-CM

## 2016-10-10 DIAGNOSIS — Z5111 Encounter for antineoplastic chemotherapy: Secondary | ICD-10-CM

## 2016-10-10 DIAGNOSIS — Z5112 Encounter for antineoplastic immunotherapy: Secondary | ICD-10-CM

## 2016-10-10 DIAGNOSIS — C569 Malignant neoplasm of unspecified ovary: Secondary | ICD-10-CM

## 2016-10-10 LAB — CBC WITH DIFFERENTIAL/PLATELET
BASO%: 0.3 % (ref 0.0–2.0)
Basophils Absolute: 0 10*3/uL (ref 0.0–0.1)
EOS%: 1.5 % (ref 0.0–7.0)
Eosinophils Absolute: 0.1 10*3/uL (ref 0.0–0.5)
HCT: 34.6 % — ABNORMAL LOW (ref 34.8–46.6)
HGB: 11.2 g/dL — ABNORMAL LOW (ref 11.6–15.9)
LYMPH%: 28.7 % (ref 14.0–49.7)
MCH: 30.2 pg (ref 25.1–34.0)
MCHC: 32.4 g/dL (ref 31.5–36.0)
MCV: 93.3 fL (ref 79.5–101.0)
MONO#: 0.2 10*3/uL (ref 0.1–0.9)
MONO%: 5.9 % (ref 0.0–14.0)
NEUT#: 2.2 10*3/uL (ref 1.5–6.5)
NEUT%: 63.6 % (ref 38.4–76.8)
Platelets: 100 10*3/uL — ABNORMAL LOW (ref 145–400)
RBC: 3.71 10*6/uL (ref 3.70–5.45)
RDW: 19.5 % — ABNORMAL HIGH (ref 11.2–14.5)
WBC: 3.4 10*3/uL — ABNORMAL LOW (ref 3.9–10.3)
lymph#: 1 10*3/uL (ref 0.9–3.3)

## 2016-10-10 LAB — COMPREHENSIVE METABOLIC PANEL
ALT: 51 U/L (ref 0–55)
AST: 55 U/L — ABNORMAL HIGH (ref 5–34)
Albumin: 3.5 g/dL (ref 3.5–5.0)
Alkaline Phosphatase: 102 U/L (ref 40–150)
Anion Gap: 9 mEq/L (ref 3–11)
BUN: 13.2 mg/dL (ref 7.0–26.0)
CO2: 26 mEq/L (ref 22–29)
Calcium: 9.4 mg/dL (ref 8.4–10.4)
Chloride: 110 mEq/L — ABNORMAL HIGH (ref 98–109)
Creatinine: 0.8 mg/dL (ref 0.6–1.1)
EGFR: 73 mL/min/{1.73_m2} — ABNORMAL LOW (ref >90)
Glucose: 134 mg/dl (ref 70–140)
Potassium: 3.8 mEq/L (ref 3.5–5.1)
Sodium: 145 mEq/L (ref 136–145)
Total Bilirubin: 0.76 mg/dL (ref 0.20–1.20)
Total Protein: 6.8 g/dL (ref 6.4–8.3)

## 2016-10-10 LAB — UA PROTEIN, DIPSTICK - CHCC: Protein, ur: NEGATIVE mg/dL

## 2016-10-10 MED ORDER — HEPARIN SOD (PORK) LOCK FLUSH 100 UNIT/ML IV SOLN
500.0000 [IU] | Freq: Once | INTRAVENOUS | Status: AC | PRN
  Administered 2016-10-10: 500 [IU]
  Filled 2016-10-10: qty 5

## 2016-10-10 MED ORDER — SODIUM CHLORIDE 0.9% FLUSH
10.0000 mL | Freq: Once | INTRAVENOUS | Status: AC
  Administered 2016-10-10: 10 mL
  Filled 2016-10-10: qty 10

## 2016-10-10 MED ORDER — DOXORUBICIN HCL LIPOSOMAL CHEMO INJECTION 2 MG/ML
31.0000 mg/m2 | Freq: Once | INTRAVENOUS | Status: AC
  Administered 2016-10-10: 60 mg via INTRAVENOUS
  Filled 2016-10-10: qty 30

## 2016-10-10 MED ORDER — DEXTROSE 5 % IV SOLN
Freq: Once | INTRAVENOUS | Status: AC
  Administered 2016-10-10: 12:00:00 via INTRAVENOUS

## 2016-10-10 MED ORDER — HYDROCODONE-ACETAMINOPHEN 5-325 MG PO TABS: ORAL_TABLET | ORAL | 0 refills | Status: DC

## 2016-10-10 MED ORDER — DEXAMETHASONE SODIUM PHOSPHATE 10 MG/ML IJ SOLN
10.0000 mg | Freq: Once | INTRAMUSCULAR | Status: AC
  Administered 2016-10-10: 10 mg via INTRAVENOUS

## 2016-10-10 MED ORDER — DEXAMETHASONE SODIUM PHOSPHATE 10 MG/ML IJ SOLN
INTRAMUSCULAR | Status: AC
  Filled 2016-10-10: qty 1

## 2016-10-10 MED ORDER — SODIUM CHLORIDE 0.9 % IV SOLN
Freq: Once | INTRAVENOUS | Status: AC
  Administered 2016-10-10: 10:00:00 via INTRAVENOUS

## 2016-10-10 MED ORDER — SODIUM CHLORIDE 0.9% FLUSH
10.0000 mL | INTRAVENOUS | Status: DC | PRN
  Administered 2016-10-10: 10 mL
  Filled 2016-10-10: qty 10

## 2016-10-10 MED ORDER — SODIUM CHLORIDE 0.9 % IV SOLN
10.2500 mg/kg | Freq: Once | INTRAVENOUS | Status: AC
  Administered 2016-10-10: 900 mg via INTRAVENOUS
  Filled 2016-10-10: qty 32

## 2016-10-10 NOTE — Patient Instructions (Signed)
[facility] Discharge Instructions for Patients Receiving Chemotherapy  Today you received the following chemotherapy agents :  avastin/doxil  To help prevent nausea and vomiting after your treatment, we encourage you to take your nausea medication.   If you develop nausea and vomiting that is not controlled by your nausea medication, call the clinic.   BELOW ARE SYMPTOMS THAT SHOULD BE REPORTED IMMEDIATELY:  *FEVER GREATER THAN 100.5 F  *CHILLS WITH OR WITHOUT FEVER  NAUSEA AND VOMITING THAT IS NOT CONTROLLED WITH YOUR NAUSEA MEDICATION  *UNUSUAL SHORTNESS OF BREATH  *UNUSUAL BRUISING OR BLEEDING  TENDERNESS IN MOUTH AND THROAT WITH OR WITHOUT PRESENCE OF ULCERS  *URINARY PROBLEMS  *BOWEL PROBLEMS  UNUSUAL RASH Items with * indicate a potential emergency and should be followed up as soon as possible.  Feel free to call the clinic you have any questions or concerns. The clinic phone number is [phone].  Please show the [facility] at check-in to the Emergency Department and triage nurse.

## 2016-10-11 LAB — CA 125: Cancer Antigen (CA) 125: 65.6 U/mL — ABNORMAL HIGH (ref 0.0–38.1)

## 2016-10-24 ENCOUNTER — Other Ambulatory Visit (HOSPITAL_BASED_OUTPATIENT_CLINIC_OR_DEPARTMENT_OTHER): Payer: Medicare Other

## 2016-10-24 ENCOUNTER — Ambulatory Visit (HOSPITAL_BASED_OUTPATIENT_CLINIC_OR_DEPARTMENT_OTHER): Payer: Medicare Other

## 2016-10-24 ENCOUNTER — Encounter: Payer: Self-pay | Admitting: Hematology and Oncology

## 2016-10-24 ENCOUNTER — Other Ambulatory Visit: Payer: Self-pay | Admitting: Hematology and Oncology

## 2016-10-24 ENCOUNTER — Ambulatory Visit: Payer: Medicare Other

## 2016-10-24 ENCOUNTER — Ambulatory Visit (HOSPITAL_BASED_OUTPATIENT_CLINIC_OR_DEPARTMENT_OTHER): Payer: Medicare Other | Admitting: Hematology and Oncology

## 2016-10-24 VITALS — BP 130/66 | HR 76 | Temp 97.7°F

## 2016-10-24 DIAGNOSIS — G620 Drug-induced polyneuropathy: Secondary | ICD-10-CM | POA: Diagnosis not present

## 2016-10-24 DIAGNOSIS — C482 Malignant neoplasm of peritoneum, unspecified: Secondary | ICD-10-CM

## 2016-10-24 DIAGNOSIS — C569 Malignant neoplasm of unspecified ovary: Secondary | ICD-10-CM

## 2016-10-24 DIAGNOSIS — Z794 Long term (current) use of insulin: Secondary | ICD-10-CM | POA: Diagnosis not present

## 2016-10-24 DIAGNOSIS — E1140 Type 2 diabetes mellitus with diabetic neuropathy, unspecified: Secondary | ICD-10-CM | POA: Diagnosis not present

## 2016-10-24 DIAGNOSIS — IMO0001 Reserved for inherently not codable concepts without codable children: Secondary | ICD-10-CM

## 2016-10-24 DIAGNOSIS — K7581 Nonalcoholic steatohepatitis (NASH): Secondary | ICD-10-CM

## 2016-10-24 DIAGNOSIS — Z5112 Encounter for antineoplastic immunotherapy: Secondary | ICD-10-CM

## 2016-10-24 DIAGNOSIS — K7460 Unspecified cirrhosis of liver: Secondary | ICD-10-CM

## 2016-10-24 DIAGNOSIS — R110 Nausea: Secondary | ICD-10-CM | POA: Insufficient documentation

## 2016-10-24 DIAGNOSIS — T451X5A Adverse effect of antineoplastic and immunosuppressive drugs, initial encounter: Secondary | ICD-10-CM | POA: Insufficient documentation

## 2016-10-24 DIAGNOSIS — E1165 Type 2 diabetes mellitus with hyperglycemia: Secondary | ICD-10-CM | POA: Diagnosis not present

## 2016-10-24 LAB — COMPREHENSIVE METABOLIC PANEL
ALT: 69 U/L — ABNORMAL HIGH (ref 0–55)
AST: 71 U/L — ABNORMAL HIGH (ref 5–34)
Albumin: 3.5 g/dL (ref 3.5–5.0)
Alkaline Phosphatase: 97 U/L (ref 40–150)
Anion Gap: 12 mEq/L — ABNORMAL HIGH (ref 3–11)
BUN: 22.1 mg/dL (ref 7.0–26.0)
CO2: 22 mEq/L (ref 22–29)
Calcium: 9.2 mg/dL (ref 8.4–10.4)
Chloride: 107 mEq/L (ref 98–109)
Creatinine: 0.9 mg/dL (ref 0.6–1.1)
EGFR: 70 mL/min/{1.73_m2} — ABNORMAL LOW (ref >90)
Glucose: 173 mg/dl — ABNORMAL HIGH (ref 70–140)
Potassium: 4.2 mEq/L (ref 3.5–5.1)
Sodium: 140 mEq/L (ref 136–145)
Total Bilirubin: 0.66 mg/dL (ref 0.20–1.20)
Total Protein: 6.7 g/dL (ref 6.4–8.3)

## 2016-10-24 LAB — CBC WITH DIFFERENTIAL/PLATELET
BASO%: 0.6 % (ref 0.0–2.0)
Basophils Absolute: 0 10*3/uL (ref 0.0–0.1)
EOS%: 2.7 % (ref 0.0–7.0)
Eosinophils Absolute: 0.1 10*3/uL (ref 0.0–0.5)
HCT: 31.7 % — ABNORMAL LOW (ref 34.8–46.6)
HGB: 10.6 g/dL — ABNORMAL LOW (ref 11.6–15.9)
LYMPH%: 25.6 % (ref 14.0–49.7)
MCH: 31.4 pg (ref 25.1–34.0)
MCHC: 33.5 g/dL (ref 31.5–36.0)
MCV: 93.7 fL (ref 79.5–101.0)
MONO#: 0.2 10*3/uL (ref 0.1–0.9)
MONO%: 3.9 % (ref 0.0–14.0)
NEUT#: 2.8 10*3/uL (ref 1.5–6.5)
NEUT%: 67.2 % (ref 38.4–76.8)
Platelets: 118 10*3/uL — ABNORMAL LOW (ref 145–400)
RBC: 3.39 10*6/uL — ABNORMAL LOW (ref 3.70–5.45)
RDW: 21.3 % — ABNORMAL HIGH (ref 11.2–14.5)
WBC: 4.2 10*3/uL (ref 3.9–10.3)
lymph#: 1.1 10*3/uL (ref 0.9–3.3)

## 2016-10-24 MED ORDER — SODIUM CHLORIDE 0.9 % IV SOLN
Freq: Once | INTRAVENOUS | Status: AC
  Administered 2016-10-24: 12:00:00 via INTRAVENOUS

## 2016-10-24 MED ORDER — HEPARIN SOD (PORK) LOCK FLUSH 100 UNIT/ML IV SOLN
500.0000 [IU] | Freq: Once | INTRAVENOUS | Status: AC | PRN
  Administered 2016-10-24: 500 [IU]
  Filled 2016-10-24: qty 5

## 2016-10-24 MED ORDER — SODIUM CHLORIDE 0.9 % IV SOLN
10.3000 mg/kg | Freq: Once | INTRAVENOUS | Status: AC
  Administered 2016-10-24: 900 mg via INTRAVENOUS
  Filled 2016-10-24: qty 32

## 2016-10-24 MED ORDER — ONDANSETRON HCL 8 MG PO TABS: 8.0000 mg | ORAL_TABLET | Freq: Three times a day (TID) | ORAL | 3 refills | Status: DC | PRN

## 2016-10-24 MED ORDER — SODIUM CHLORIDE 0.9% FLUSH
10.0000 mL | Freq: Once | INTRAVENOUS | Status: AC
  Administered 2016-10-24: 10 mL
  Filled 2016-10-24: qty 10

## 2016-10-24 MED ORDER — SODIUM CHLORIDE 0.9% FLUSH
10.0000 mL | INTRAVENOUS | Status: DC | PRN
  Administered 2016-10-24: 10 mL
  Filled 2016-10-24: qty 10

## 2016-10-24 MED ORDER — PROMETHAZINE HCL 25 MG PO TABS: 25.0000 mg | ORAL_TABLET | Freq: Four times a day (QID) | ORAL | 3 refills | Status: DC | PRN

## 2016-10-24 MED ORDER — SODIUM CHLORIDE 0.9% FLUSH
3.0000 mL | Freq: Once | INTRAVENOUS | Status: DC | PRN
  Filled 2016-10-24: qty 10

## 2016-10-24 NOTE — Assessment & Plan Note (Signed)
She has baseline mild liver cirrhosis I recommend minor dose adjustment to chemotherapy to reduce the risk of toxicity especially with morbid obesity

## 2016-10-24 NOTE — Progress Notes (Signed)
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  No worrisome liver mass.  There are associated portal venous collaterals and splenomegaly consistent with portal venous hypertension. 2.  No other significant upper abdominal findings      11/08/2013 Imaging    US showed ascites      11/08/2013 Initial Diagnosis    Patient had ~ 2 months of some urinary incontinence, saw Dr Ronita Hipps in 10-2013, with pelvic mass on exam       11/12/2013 Imaging    There are findings of progressive hepatic cirrhosis with a large amount of ascites and mild splenomegaly. There are venous collaterals present. 2. There is abnormal thickening of the peritoneal surface along the left mid and lower abdominal wall. There is abnormal soft tissue density in the pelvis as well which could reflect the clinically suspected ovarian malignancy but the findings are nondiagnostic. 3. There is a new small left pleural effusion. 4. There is no acute bowel abnormality.      11/18/2013 Imaging    Successful ultrasound guided diagnostic and therapeutic paracentesis yielding 4.1 liters of ascites.       11/18/2013 Pathology Results    PERITONEAL/ASCITIC FLUID (SPECIMEN 1 OF 1 COLLECTED 11/18/13): SEROUS CARCINOMA, PLEASE SEE COMMENT.      12/01/2013 Tumor Marker    Patient's tumor was tested for the following markers: CA125 Results of the tumor marker test revealed 1938      12/03/2013 Imaging    Successful ultrasound-guided therapeutic paracentesis yielding 3.6 liters of peritoneal fluid.      12/07/2013 - 05/30/2014 Chemotherapy    She received 3 cycles of carboplatin and Taxol, interrupted cycle 4 for surgery.  She  subsequently completed 3 more cycles of chemotherapy after surgery      12/20/2013 Imaging    Successful ultrasound-guided diagnostic and therapeutic paracentesis yielding 2.2 liters of peritoneal fluid      12/20/2013 Pathology Results    PERITONEAL/ASCITIC FLUID(SPECIMEN 1 OF 1 COLLECTED 12/20/13): MALIGNANT CELLS CONSISTENT WITH SEROUS CARCINOMA      01/13/2014 Tumor Marker    Patient's tumor was tested for the following markers: CA125 Results of the tumor marker test revealed 227      02/07/2014 Tumor Marker    Patient's tumor was tested for the following markers: CA125 Results of the tumor marker test revealed 130      02/15/2014 Genetic Testing    Genetics testing from 02-2014 normal ([employer] panel)      02/23/2014 Imaging    Interval decrease in ascites. 2. Morphologic changes in the liver consistent with cirrhosis. 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 Portal vein remains patent at this time. 3. Persistent but decreased abnormal soft tissue attenuation tracking in the omental fat. This may be secondary to interval improvement in metastatic disease. 4. Peritoneal thickening seen in the left abdomen and pelvis on the previous study has decreased and nearly resolved in the interval. This also suggests interval improvement in metastatic disease.       03/07/2014 Procedure    Ultrasound and fluoroscopically guided right internal jugular single lumen power port catheter insertion. Tip in the SVC/RA junction. Catheter ready for use.      03/17/2014  Tumor Marker    Patient's tumor was tested for the following markers: CA125 Results of the tumor marker test revealed 45      03/29/2014 Pathology Results    1. Omentum, resection for tumor - HIGH GRADE SEROUS CARCINOMA, SEE COMMENT. 2. Ovary and fallopian tube, right - HIGH GRADE SEROUS CARCINOMA, SEE COMMENT. 3. Ovary and fallopian tube, left - HIGH GRADE SEROUS CARCINOMA, SEE  COMMENT. Diagnosis Note 1. Nests and clusters of malignant cells are invading the omental tissue (part #1) with associated fibrosis. The cells are pleomorphic with prominent nucleoli. There are scattered psammoma bodies. The ovaries are atrophic and exhibit multiple foci of surface based invasive carcinoma and associated fibrosis. The fallopian tubes have a few foci of carcinoma, also superficially located. There are no precursor lesions noted in the ovaries or fallopian tubes (the entire tubes and ovaries were submitted for evaluation). While there is some retraction artifact, there are several foci suspicious for lymphovascular invasion. 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 Given the fibrosis, there does appear to be a small amount of treatment effect, however, there is abundant residual tumor.       03/29/2014 Surgery    Procedure(s) Performed: 1. Exploratory laparotomy with bilateral salpingo-oophorectomy, omentectomy radical tumor debulking for ovarian cancer .  Surgeon: Thereasa Solo, MD.  Assistant Surgeon: Lahoma Crocker, M.D. Assistant: (an MD assistant was necessary for tissue manipulation, retraction and positioning due to the complexity of the case and hospital policies).  Operative Findings: 10cm omental cake from hepatic to splenic flexure, densely adherent to transverse colon. Milliary studding of tumor implants (<41m, too numerous in number to count) adherent to the mesentery of the small bowel and small bowel wall. Small volume (100cc) ascites. Small ovaries bilaterally, densely adherent to the pelvic cul de sac. Left ovary and tube densely adherent to sigmoid colon. Cirrhotic liver with hepatomegaly and splenomegaly.     This represented an optimal cytoreduction (R1) with visible disease residual on the bowel wall and mesentery (millial, <349m implants).        04/18/2014 Tumor Marker    Patient's tumor was tested for the following markers: CA125 Results of the tumor marker test revealed 122      05/16/2014 Tumor Marker    Patient's tumor was tested for the following markers: CA125 Results of the tumor marker test revealed 30      06/10/2014 Tumor Marker    Patient's tumor was tested for the following markers: CA125 Results of the tumor marker test revealed 19      07/04/2014 Imaging    Interval improvement in the appearance of peritoneal metastasis secondary to ovarian cancer. 2. 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       08/18/2014 Tumor Marker    Patient's tumor was tested for the following markers: CA125 Results of the tumor marker test revealed 16      10/13/2014 Tumor Marker    Patient's tumor was tested for the following markers: CA125 Results of the tumor marker test revealed 14      11/21/2014 Tumor Marker    Patient's tumor was tested for the following markers: CA125 Results of the tumor marker test revealed 16      12/28/2014 Tumor Marker    Patient's tumor was tested for the following markers: CA125 Results of the tumor marker test revealed 762  02/27/2015 Tumor Marker    Patient's tumor was tested for the following markers: CA125 Results of the tumor marker test revealed 20.2      03/22/2015 Imaging    Filler appearance to the prior exam, with very subtle fluid tracking along portions of the liver, and very minimal nodularity along the right paracolic gutter representing residua from the prior peritoneal cancer. The current abnormalities could simply be post therapy findings rather than necessarily representing residual malignancy. No new or enlarging lesions are identified. 2. Hepatic cirrhosis and splenomegaly. There is some gastric varices suggesting portal venous hypertension. 3. Left foraminal stenosis at L4-5 due to spurring. There is likely also some central narrowing  of the thecal sac at this level. 4. Bibasilar scarring. 5. Chronic mild left mid kidney scarring      03/27/2015 Tumor Marker    Patient's tumor was tested for the following markers: CA125 Results of the tumor marker test revealed 25.3      04/24/2015 Imaging    Disc bulge L2-3 with mild spinal stenosis and right foraminal encroachment. 2. Disc bulge L3-4 with mild spinal stenosis. 3. Multifactorial spinal, left lateral recess and foraminal stenosis L4-5. There is grade 1 anterolisthesis without evident dynamic instability.      05/29/2015 Tumor Marker    Patient's tumor was tested for the following markers: CA125 Results of the tumor marker test revealed 29.9      08/21/2015 Tumor Marker    Patient's tumor was tested for the following markers: CA125 Results of the tumor marker test revealed 45      09/18/2015 Tumor Marker    Patient's tumor was tested for the following markers: CA125 Results of the tumor marker test revealed 47      09/27/2015 Imaging    New small bowel mesenteric nodule and enlarged left external iliac lymph node, highly worrisome for metastatic disease. 2. Cirrhosis and splenomegaly      10/10/2015 Tumor Marker    Patient's tumor was tested for the following markers: CA125 Results of the tumor marker test revealed 53.4      10/10/2015 - 12/11/2015 Chemotherapy    She received 4 cycles of carboplatin only      11/20/2015 Tumor Marker    Patient's tumor was tested for the following markers: CA125 Results of the tumor marker test revealed 41.3      12/20/2015 Imaging    CT: Mild left external iliac lymphadenopathy is slightly decreased. Mildly enlarged lower mesenteric nodule is slightly decreased. 2. No new or progressive metastatic disease in the abdomen or pelvis. 3. Cirrhosis. Stable subcentimeter hypodense left liver lobe lesion. No new liver lesions. 4. Stable mild splenomegaly.  No ascites. 5. Mild sigmoid diverticulosis.      01/01/2016 -  Anti-estrogen  oral therapy    She was placed on tamoxifen. Had letrozole initially but switched to tamoxifen due to poor tolerance      01/15/2016 Tumor Marker    Patient's tumor was tested for the following markers: CA125 Results of the tumor marker test revealed 31.4      02/19/2016 Tumor Marker    Patient's tumor was tested for the following markers: CA125 Results of the tumor marker test revealed 36.5      05/13/2016 Imaging    No evidence of disease progression within the abdomen or pelvis. Previously noted small central mesenteric nodule and left external iliac node appear slightly smaller. 2. Stable changes of hepatic cirrhosis and portal hypertension with associated splenomegaly. No new or  enlarging hepatic lesions are identified. 3. No acute findings.      05/13/2016 Tumor Marker    Patient's tumor was tested for the following markers: CA125 Results of the tumor marker test revealed 41.2      06/24/2016 Tumor Marker    Patient's tumor was tested for the following markers: CA125 Results of the tumor marker test revealed 47.3      08/20/2016 Imaging    1. The left external iliac lymph node is slightly increased in size compared to the prior exam, currently 1.1 cm and previously 0.9 cm in diameter. 2. Stable central mesenteric lymph node at 0.9 cm in diameter. 3. Stable trace free pelvic fluid and stable slight thickening along the right paracolic gutter, without well-defined peritoneal nodularity. 4. Other imaging findings of potential clinical significance: Subsegmental atelectasis or scarring in the lung bases. Hepatic cirrhosis. Left renal scarring. Mild splenomegaly. Sigmoid colon diverticulosis. Impingement at L4-5 due to spondylosis and degenerative disc disease broad Schmorl' s nodes at L3-L4. Pelvic floor laxity      08/23/2016 Tumor Marker    Patient's tumor was tested for the following markers: CA125 Results of the tumor marker test revealed 80.2      09/04/2016 Imaging    LV EF: 60%  -   65%      09/12/2016 Tumor Marker    Patient's tumor was tested for the following markers: CA125 Results of the tumor marker test revealed 98.5      09/12/2016 -  Chemotherapy    The patient received Doxil and Avastin      09/26/2016 Tumor Marker    Patient's tumor was tested for the following markers: CA125 Results of the tumor marker test revealed 68.9      10/10/2016 Tumor Marker    Patient's tumor was tested for the following markers: CA125 Results of the tumor marker test revealed 65.6       INTERVAL HISTORY: Please see below for problem oriented charting. She returns to be seen on cycle 2, day 15 of treatment She complain of high blood sugar for several days, and a 300 range after each cycle of chemo She also developed some nausea but no vomiting She felt her neuropathy is slightly worse Denies abdominal pain, constipation or diarrhea No mucositis  REVIEW OF SYSTEMS:   Constitutional: Denies fevers, chills or abnormal weight loss Eyes: Denies blurriness of vision Ears, nose, mouth, throat, and face: Denies mucositis or sore throat Respiratory: Denies cough, dyspnea or wheezes Cardiovascular: Denies palpitation, chest discomfort or lower extremity swelling Skin: Denies abnormal skin rashes Lymphatics: Denies new lymphadenopathy or easy bruising Neurological:Denies numbness, tingling or new weaknesses Behavioral/Psych: Mood is stable, no new changes  All other systems were reviewed with the patient and are negative.  I have reviewed the past medical history, past surgical history, social history and family history with the patient and they are unchanged from previous note.  ALLERGIES:  is allergic to shellfish allergy; benadryl [diphenhydramine hcl]; penicillins; and tape.  MEDICATIONS:  Current Outpatient Prescriptions  Medication Sig Dispense Refill  . amLODipine (NORVASC) 5 MG tablet Take 5 mg by mouth daily.  0  . aspirin EC 81 MG tablet Take 81 mg by mouth  daily.    . bisoprolol (ZEBETA) 5 MG tablet   0  . cholecalciferol (VITAMIN D) 1000 units tablet Take 1,000 Units by mouth daily.    Marland Kitchen gabapentin (NEURONTIN) 300 MG capsule Take 2 capsules (600 mg total) by mouth 2 (two) times  daily. 90 capsule 3  . HYDROcodone-acetaminophen (NORCO/VICODIN) 5-325 MG tablet take 1 tablet by mouth every 6 hours if needed for MODERATE PAIN 60 tablet 0  . insulin degludec (TRESIBA) 100 UNIT/ML SOPN FlexTouch Pen Inject 13 Units into the skin daily.     Marland Kitchen lidocaine-prilocaine (EMLA) cream Apply to Porta-cath  1-2 hours prior to access as directed. 30 g 1  . metFORMIN (GLUCOPHAGE-XR) 500 MG 24 hr tablet Take 1,000 mg by mouth 2 (two) times daily.   0  . ondansetron (ZOFRAN) 8 MG tablet Take 1 tablet (8 mg total) by mouth every 8 (eight) hours as needed for nausea. 30 tablet 3  . pantoprazole (PROTONIX) 40 MG tablet Take 40 mg by mouth daily.   0  . promethazine (PHENERGAN) 25 MG tablet Take 1 tablet (25 mg total) by mouth every 6 (six) hours as needed for nausea. 30 tablet 3  . rotigotine (NEUPRO) 4 MG/24HR Place 1 patch onto the skin daily. 30 patch 5   No current facility-administered medications for this visit.    Facility-Administered Medications Ordered in Other Visits  Medication Dose Route Frequency Provider Last Rate Last Dose  . bevacizumab (AVASTIN) 875 mg in sodium chloride 0.9 % 100 mL chemo infusion  10 mg/kg (Treatment Plan Recorded) Intravenous Once [name], MD        PHYSICAL EXAMINATION: ECOG PERFORMANCE STATUS: 1 - Symptomatic but completely ambulatory  Vitals:   10/24/16 1118  BP: (!) 134/59  Pulse: 80  Resp: 20  Temp: 97.9 F (36.6 C)  SpO2: 98%   Filed Weights   10/24/16 1118  Weight: 188 lb 11.2 oz (85.6 kg)    GENERAL:alert, no distress and comfortable SKIN: skin color, texture, turgor are normal, no rashes or significant lesions EYES: normal, Conjunctiva are pink and non-injected, sclera clear OROPHARYNX:no exudate, no  erythema and lips, buccal mucosa, and tongue normal  NECK: supple, thyroid normal size, non-tender, without nodularity LYMPH:  no palpable lymphadenopathy in the cervical, axillary or inguinal LUNGS: clear to auscultation and percussion with normal breathing effort HEART: regular rate & rhythm and no murmurs and no lower extremity edema ABDOMEN:abdomen soft, non-tender and normal bowel sounds Musculoskeletal:no cyanosis of digits and no clubbing  NEURO: alert & oriented x 3 with fluent speech, no focal motor/sensory deficits  LABORATORY DATA:  I have reviewed the data as listed    Component Value Date/Time   NA 140 10/24/2016 1057   K 4.2 10/24/2016 1057   CL 105 03/30/2014 0618   CO2 22 10/24/2016 1057   GLUCOSE 173 (H) 10/24/2016 1057   BUN 22.1 10/24/2016 1057   CREATININE 0.9 10/24/2016 1057   CALCIUM 9.2 10/24/2016 1057   PROT 6.7 10/24/2016 1057   ALBUMIN 3.5 10/24/2016 1057   AST 71 (H) 10/24/2016 1057   ALT 69 (H) 10/24/2016 1057   ALKPHOS 97 10/24/2016 1057   BILITOT 0.66 10/24/2016 1057   GFRNONAA 86 (L) 03/30/2014 0618   GFRAA >90 03/30/2014 0618    No results found for: SPEP, UPEP  Lab Results  Component Value Date   WBC 4.2 10/24/2016   NEUTROABS 2.8 10/24/2016   HGB 10.6 (L) 10/24/2016   HCT 31.7 (L) 10/24/2016   MCV 93.7 10/24/2016   PLT 118 (L) 10/24/2016      Chemistry      Component Value Date/Time   NA 140 10/24/2016 1057   K 4.2 10/24/2016 1057   CL 105 03/30/2014 0618   CO2 22 10/24/2016 1057  BUN 22.1 10/24/2016 1057   CREATININE 0.9 10/24/2016 1057      Component Value Date/Time   CALCIUM 9.2 10/24/2016 1057   ALKPHOS 97 10/24/2016 1057   AST 71 (H) 10/24/2016 1057   ALT 69 (H) 10/24/2016 1057   BILITOT 0.66 10/24/2016 1057       ASSESSMENT & PLAN:  Primary peritoneal carcinomatosis ([place]) She tolerated treatment well with positive response to treatment, documented by improved tumor markers I recommend minimum 3 cycles of  chemotherapy before repeat staging CT scan and echocardiogram In the meantime, she will continue to receive supportive care  Chemotherapy-induced neuropathy ([place]) She had mild worsening peripheral neuropathy which I do not think is related to recent chemo It is possible due to poorly controlled diabetes We will continue gabapentin and pain management  Liver cirrhosis secondary to [facility]) She has baseline mild liver cirrhosis I recommend minor dose adjustment to chemotherapy to reduce the risk of toxicity especially with morbid obesity  Chemotherapy-induced nausea She had recent chemotherapy induced nausea I will add Aloxi We discussed antiemetic management  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  All questions were answered. The patient knows to call the clinic with any problems, questions or concerns. No barriers to learning was detected. I spent 25 minutes counseling the patient face to face. The total time spent in the appointment was 30 minutes and more than 50% was on counseling and review of test results     [name], MD 10/24/2016 11:58 AM

## 2016-10-24 NOTE — Assessment & Plan Note (Signed)
She tolerated treatment well with positive response to treatment, documented by improved tumor markers I recommend minimum 3 cycles of chemotherapy before repeat staging CT scan and echocardiogram In the meantime, she will continue to receive supportive care

## 2016-10-24 NOTE — Patient Instructions (Signed)
[facility] Discharge Instructions for Patients Receiving Chemotherapy  Today you received the following chemotherapy agents: Avastin. If you develop nausea and vomiting that is not controlled by your nausea medication, call the clinic.   BELOW ARE SYMPTOMS THAT SHOULD BE REPORTED IMMEDIATELY:  *FEVER GREATER THAN 100.5 F  *CHILLS WITH OR WITHOUT FEVER  NAUSEA AND VOMITING THAT IS NOT CONTROLLED WITH YOUR NAUSEA MEDICATION  *UNUSUAL SHORTNESS OF BREATH  *UNUSUAL BRUISING OR BLEEDING  TENDERNESS IN MOUTH AND THROAT WITH OR WITHOUT PRESENCE OF ULCERS  *URINARY PROBLEMS  *BOWEL PROBLEMS  UNUSUAL RASH Items with * indicate a potential emergency and should be followed up as soon as possible.  Feel free to call the clinic should you have any questions or concerns. The clinic phone number is [phone].  Please show the [place] at check-in to the Emergency Department and triage nurse.

## 2016-10-24 NOTE — Assessment & Plan Note (Signed)
She had recent chemotherapy induced nausea I will add Aloxi We discussed antiemetic management

## 2016-10-24 NOTE — Assessment & Plan Note (Signed)
She has poorly controlled diabetes for the first few days after each cycle of chemo Recommending increasing the dose of insulin for 2 days after treatment from 15 units to 20 units and continue to monitor her blood sugar carefully while on treatment

## 2016-10-24 NOTE — Assessment & Plan Note (Signed)
She had mild worsening peripheral neuropathy which I do not think is related to recent chemo It is possible due to poorly controlled diabetes We will continue gabapentin and pain management

## 2016-10-25 ENCOUNTER — Telehealth: Payer: Self-pay

## 2016-10-25 ENCOUNTER — Telehealth: Payer: Self-pay | Admitting: Hematology and Oncology

## 2016-10-25 NOTE — Telephone Encounter (Signed)
Called with below message. 

## 2016-10-25 NOTE — Telephone Encounter (Signed)
-----   Message from [name], MD sent at 10/25/2016 12:47 PM EDT ----- Regarding: RE: CT? I told her scan is not due until October and I will order it when I see her next month Hassan Rowan, pls call her and let her know it is not due yet ----- Message ----- From: Nicholaus Corolla Sent: 10/25/2016  11:04 AM To: Patton Salles, RN, [name], MD Subject: CT?                                            Scheduled appt per 9/13 los - called patient to confirm apts and they where wondering about CT that needed to be scheduled. - is Rn able to contact her on whether its needed or not.  I didn't see an order in .   I told her if it was needed and you placed the order for [name] - the radiology department will contact her .

## 2016-10-25 NOTE — Telephone Encounter (Signed)
Scheduled appt per 9/13 los - patient is aware of appts date and time - my chart active.

## 2016-11-07 ENCOUNTER — Ambulatory Visit (HOSPITAL_BASED_OUTPATIENT_CLINIC_OR_DEPARTMENT_OTHER): Payer: Medicare Other

## 2016-11-07 ENCOUNTER — Ambulatory Visit: Payer: Medicare Other

## 2016-11-07 ENCOUNTER — Other Ambulatory Visit (HOSPITAL_BASED_OUTPATIENT_CLINIC_OR_DEPARTMENT_OTHER): Payer: Medicare Other

## 2016-11-07 VITALS — BP 126/68 | HR 74 | Temp 98.4°F | Resp 16

## 2016-11-07 DIAGNOSIS — C482 Malignant neoplasm of peritoneum, unspecified: Secondary | ICD-10-CM | POA: Diagnosis present

## 2016-11-07 DIAGNOSIS — C569 Malignant neoplasm of unspecified ovary: Secondary | ICD-10-CM

## 2016-11-07 DIAGNOSIS — Z5112 Encounter for antineoplastic immunotherapy: Secondary | ICD-10-CM

## 2016-11-07 DIAGNOSIS — Z5111 Encounter for antineoplastic chemotherapy: Secondary | ICD-10-CM

## 2016-11-07 DIAGNOSIS — Z23 Encounter for immunization: Secondary | ICD-10-CM | POA: Diagnosis not present

## 2016-11-07 LAB — CBC WITH DIFFERENTIAL/PLATELET
BASO%: 0.3 % (ref 0.0–2.0)
Basophils Absolute: 0 10*3/uL (ref 0.0–0.1)
EOS%: 1.8 % (ref 0.0–7.0)
Eosinophils Absolute: 0.1 10*3/uL (ref 0.0–0.5)
HCT: 31 % — ABNORMAL LOW (ref 34.8–46.6)
HGB: 9.9 g/dL — ABNORMAL LOW (ref 11.6–15.9)
LYMPH%: 22.7 % (ref 14.0–49.7)
MCH: 29.5 pg (ref 25.1–34.0)
MCHC: 31.9 g/dL (ref 31.5–36.0)
MCV: 92.3 fL (ref 79.5–101.0)
MONO#: 0.2 10*3/uL (ref 0.1–0.9)
MONO%: 6.3 % (ref 0.0–14.0)
NEUT#: 2.7 10*3/uL (ref 1.5–6.5)
NEUT%: 68.9 % (ref 38.4–76.8)
Platelets: 100 10*3/uL — ABNORMAL LOW (ref 145–400)
RBC: 3.36 10*6/uL — ABNORMAL LOW (ref 3.70–5.45)
RDW: 17 % — ABNORMAL HIGH (ref 11.2–14.5)
WBC: 3.8 10*3/uL — ABNORMAL LOW (ref 3.9–10.3)
lymph#: 0.9 10*3/uL (ref 0.9–3.3)
nRBC: 0 % (ref 0–0)

## 2016-11-07 LAB — COMPREHENSIVE METABOLIC PANEL
ALT: 54 U/L (ref 0–55)
AST: 62 U/L — ABNORMAL HIGH (ref 5–34)
Albumin: 3.6 g/dL (ref 3.5–5.0)
Alkaline Phosphatase: 95 U/L (ref 40–150)
Anion Gap: 12 mEq/L — ABNORMAL HIGH (ref 3–11)
BUN: 11.9 mg/dL (ref 7.0–26.0)
CO2: 22 mEq/L (ref 22–29)
Calcium: 8.4 mg/dL (ref 8.4–10.4)
Chloride: 110 mEq/L — ABNORMAL HIGH (ref 98–109)
Creatinine: 0.8 mg/dL (ref 0.6–1.1)
EGFR: 73 mL/min/{1.73_m2} — ABNORMAL LOW (ref >90)
Glucose: 168 mg/dl — ABNORMAL HIGH (ref 70–140)
Potassium: 3.9 mEq/L (ref 3.5–5.1)
Sodium: 144 mEq/L (ref 136–145)
Total Bilirubin: 0.77 mg/dL (ref 0.20–1.20)
Total Protein: 6.7 g/dL (ref 6.4–8.3)

## 2016-11-07 LAB — TECHNOLOGIST REVIEW

## 2016-11-07 LAB — UA PROTEIN, DIPSTICK - CHCC: Protein, ur: NEGATIVE mg/dL

## 2016-11-07 MED ORDER — SODIUM CHLORIDE 0.9% FLUSH
10.0000 mL | INTRAVENOUS | Status: DC | PRN
  Administered 2016-11-07: 10 mL
  Filled 2016-11-07: qty 10

## 2016-11-07 MED ORDER — DEXAMETHASONE SODIUM PHOSPHATE 10 MG/ML IJ SOLN
INTRAMUSCULAR | Status: AC
Start: 2016-11-07 — End: 2016-11-07
  Filled 2016-11-07: qty 1

## 2016-11-07 MED ORDER — PALONOSETRON HCL INJECTION 0.25 MG/5ML
0.2500 mg | Freq: Once | INTRAVENOUS | Status: AC
  Administered 2016-11-07: 0.25 mg via INTRAVENOUS

## 2016-11-07 MED ORDER — INFLUENZA VAC SPLIT QUAD 0.5 ML IM SUSY
0.5000 mL | PREFILLED_SYRINGE | Freq: Once | INTRAMUSCULAR | Status: AC
  Administered 2016-11-07: 0.5 mL via INTRAMUSCULAR
  Filled 2016-11-07: qty 0.5

## 2016-11-07 MED ORDER — SODIUM CHLORIDE 0.9 % IV SOLN
10.3000 mg/kg | Freq: Once | INTRAVENOUS | Status: AC
  Administered 2016-11-07: 900 mg via INTRAVENOUS
  Filled 2016-11-07: qty 32

## 2016-11-07 MED ORDER — DEXTROSE 5 % IV SOLN
Freq: Once | INTRAVENOUS | Status: AC
  Administered 2016-11-07: 10:00:00 via INTRAVENOUS

## 2016-11-07 MED ORDER — HEPARIN SOD (PORK) LOCK FLUSH 100 UNIT/ML IV SOLN
500.0000 [IU] | Freq: Once | INTRAVENOUS | Status: AC | PRN
  Administered 2016-11-07: 500 [IU]
  Filled 2016-11-07: qty 5

## 2016-11-07 MED ORDER — SODIUM CHLORIDE 0.9% FLUSH
10.0000 mL | Freq: Once | INTRAVENOUS | Status: AC
  Administered 2016-11-07: 10 mL
  Filled 2016-11-07: qty 10

## 2016-11-07 MED ORDER — DEXAMETHASONE SODIUM PHOSPHATE 10 MG/ML IJ SOLN
10.0000 mg | Freq: Once | INTRAMUSCULAR | Status: AC
  Administered 2016-11-07: 10 mg via INTRAVENOUS

## 2016-11-07 MED ORDER — PALONOSETRON HCL INJECTION 0.25 MG/5ML
INTRAVENOUS | Status: AC
  Filled 2016-11-07: qty 5

## 2016-11-07 MED ORDER — DOXORUBICIN HCL LIPOSOMAL CHEMO INJECTION 2 MG/ML
31.0000 mg/m2 | Freq: Once | INTRAVENOUS | Status: AC
  Administered 2016-11-07: 60 mg via INTRAVENOUS
  Filled 2016-11-07: qty 30

## 2016-11-07 NOTE — Patient Instructions (Addendum)
[facility] Discharge Instructions for Patients Receiving Chemotherapy  Today you received the following chemotherapy agents Avastin and Doxil  To help prevent nausea and vomiting after your treatment, we encourage you to take your nausea medication as directed.  If you develop nausea and vomiting that is not controlled by your nausea medication, call the clinic.   BELOW ARE SYMPTOMS THAT SHOULD BE REPORTED IMMEDIATELY:  *FEVER GREATER THAN 100.5 F  *CHILLS WITH OR WITHOUT FEVER  NAUSEA AND VOMITING THAT IS NOT CONTROLLED WITH YOUR NAUSEA MEDICATION  *UNUSUAL SHORTNESS OF BREATH  *UNUSUAL BRUISING OR BLEEDING  TENDERNESS IN MOUTH AND THROAT WITH OR WITHOUT PRESENCE OF ULCERS  *URINARY PROBLEMS  *BOWEL PROBLEMS  UNUSUAL RASH Items with * indicate a potential emergency and should be followed up as soon as possible.  Feel free to call the clinic should you have any questions or concerns. The clinic phone number is [phone].  Please show the [place] at check-in to the Emergency Department and triage nurse.

## 2016-11-08 ENCOUNTER — Telehealth: Payer: Self-pay

## 2016-11-08 LAB — CA 125: Cancer Antigen (CA) 125: 46.4 U/mL — ABNORMAL HIGH (ref 0.0–38.1)

## 2016-11-08 NOTE — Telephone Encounter (Signed)
Pt called for CA 125 results. Given.

## 2016-11-21 ENCOUNTER — Other Ambulatory Visit (HOSPITAL_BASED_OUTPATIENT_CLINIC_OR_DEPARTMENT_OTHER): Payer: Medicare Other

## 2016-11-21 ENCOUNTER — Ambulatory Visit (HOSPITAL_BASED_OUTPATIENT_CLINIC_OR_DEPARTMENT_OTHER): Payer: Medicare Other | Admitting: Hematology and Oncology

## 2016-11-21 ENCOUNTER — Ambulatory Visit (HOSPITAL_BASED_OUTPATIENT_CLINIC_OR_DEPARTMENT_OTHER): Payer: Medicare Other

## 2016-11-21 ENCOUNTER — Ambulatory Visit: Payer: Medicare Other

## 2016-11-21 VITALS — BP 142/53 | HR 75 | Temp 98.3°F | Resp 20 | Ht 61.5 in | Wt 195.3 lb

## 2016-11-21 DIAGNOSIS — IMO0001 Reserved for inherently not codable concepts without codable children: Secondary | ICD-10-CM

## 2016-11-21 DIAGNOSIS — K7581 Nonalcoholic steatohepatitis (NASH): Secondary | ICD-10-CM | POA: Diagnosis not present

## 2016-11-21 DIAGNOSIS — C482 Malignant neoplasm of peritoneum, unspecified: Secondary | ICD-10-CM

## 2016-11-21 DIAGNOSIS — T451X5A Adverse effect of antineoplastic and immunosuppressive drugs, initial encounter: Secondary | ICD-10-CM

## 2016-11-21 DIAGNOSIS — D72819 Decreased white blood cell count, unspecified: Secondary | ICD-10-CM | POA: Diagnosis not present

## 2016-11-21 DIAGNOSIS — Z5111 Encounter for antineoplastic chemotherapy: Secondary | ICD-10-CM

## 2016-11-21 DIAGNOSIS — Z794 Long term (current) use of insulin: Secondary | ICD-10-CM | POA: Diagnosis not present

## 2016-11-21 DIAGNOSIS — C569 Malignant neoplasm of unspecified ovary: Secondary | ICD-10-CM

## 2016-11-21 DIAGNOSIS — D696 Thrombocytopenia, unspecified: Secondary | ICD-10-CM | POA: Diagnosis not present

## 2016-11-21 DIAGNOSIS — E1140 Type 2 diabetes mellitus with diabetic neuropathy, unspecified: Secondary | ICD-10-CM | POA: Diagnosis not present

## 2016-11-21 DIAGNOSIS — G620 Drug-induced polyneuropathy: Secondary | ICD-10-CM

## 2016-11-21 DIAGNOSIS — E1165 Type 2 diabetes mellitus with hyperglycemia: Secondary | ICD-10-CM

## 2016-11-21 DIAGNOSIS — D509 Iron deficiency anemia, unspecified: Secondary | ICD-10-CM

## 2016-11-21 DIAGNOSIS — Z5112 Encounter for antineoplastic immunotherapy: Secondary | ICD-10-CM | POA: Diagnosis present

## 2016-11-21 DIAGNOSIS — D61818 Other pancytopenia: Secondary | ICD-10-CM

## 2016-11-21 DIAGNOSIS — K7460 Unspecified cirrhosis of liver: Secondary | ICD-10-CM

## 2016-11-21 LAB — COMPREHENSIVE METABOLIC PANEL
ALT: 53 U/L (ref 0–55)
AST: 52 U/L — ABNORMAL HIGH (ref 5–34)
Albumin: 3.3 g/dL — ABNORMAL LOW (ref 3.5–5.0)
Alkaline Phosphatase: 119 U/L (ref 40–150)
Anion Gap: 11 mEq/L (ref 3–11)
BUN: 11.8 mg/dL (ref 7.0–26.0)
CO2: 20 mEq/L — ABNORMAL LOW (ref 22–29)
Calcium: 8.2 mg/dL — ABNORMAL LOW (ref 8.4–10.4)
Chloride: 109 mEq/L (ref 98–109)
Creatinine: 0.9 mg/dL (ref 0.6–1.1)
EGFR: >60 mL/min/{1.73_m2} (ref >60)
Glucose: 253 mg/dl — ABNORMAL HIGH (ref 70–140)
Potassium: 4 mEq/L (ref 3.5–5.1)
Sodium: 140 mEq/L (ref 136–145)
Total Bilirubin: 0.54 mg/dL (ref 0.20–1.20)
Total Protein: 6.2 g/dL — ABNORMAL LOW (ref 6.4–8.3)

## 2016-11-21 LAB — CBC WITH DIFFERENTIAL/PLATELET
BASO%: 0.3 % (ref 0.0–2.0)
Basophils Absolute: 0 10*3/uL (ref 0.0–0.1)
EOS%: 1.7 % (ref 0.0–7.0)
Eosinophils Absolute: 0.1 10*3/uL (ref 0.0–0.5)
HCT: 28.5 % — ABNORMAL LOW (ref 34.8–46.6)
HGB: 9 g/dL — ABNORMAL LOW (ref 11.6–15.9)
LYMPH%: 22.7 % (ref 14.0–49.7)
MCH: 29 pg (ref 25.1–34.0)
MCHC: 31.6 g/dL (ref 31.5–36.0)
MCV: 91.9 fL (ref 79.5–101.0)
MONO#: 0.1 10*3/uL (ref 0.1–0.9)
MONO%: 3.7 % (ref 0.0–14.0)
NEUT#: 2.1 10*3/uL (ref 1.5–6.5)
NEUT%: 71.6 % (ref 38.4–76.8)
Platelets: 100 10*3/uL — ABNORMAL LOW (ref 145–400)
RBC: 3.1 10*6/uL — ABNORMAL LOW (ref 3.70–5.45)
RDW: 17.3 % — ABNORMAL HIGH (ref 11.2–14.5)
WBC: 3 10*3/uL — ABNORMAL LOW (ref 3.9–10.3)
lymph#: 0.7 10*3/uL — ABNORMAL LOW (ref 0.9–3.3)

## 2016-11-21 MED ORDER — SODIUM CHLORIDE 0.9 % IV SOLN
Freq: Once | INTRAVENOUS | Status: AC
  Administered 2016-11-21: 10:00:00 via INTRAVENOUS

## 2016-11-21 MED ORDER — HYDROCODONE-ACETAMINOPHEN 5-325 MG PO TABS: ORAL_TABLET | ORAL | 0 refills | Status: DC

## 2016-11-21 MED ORDER — HEPARIN SOD (PORK) LOCK FLUSH 100 UNIT/ML IV SOLN
500.0000 [IU] | Freq: Once | INTRAVENOUS | Status: AC | PRN
  Administered 2016-11-21: 500 [IU]
  Filled 2016-11-21: qty 5

## 2016-11-21 MED ORDER — SODIUM CHLORIDE 0.9 % IV SOLN
10.2000 mg/kg | Freq: Once | INTRAVENOUS | Status: AC
  Administered 2016-11-21: 900 mg via INTRAVENOUS
  Filled 2016-11-21: qty 32

## 2016-11-21 MED ORDER — SODIUM CHLORIDE 0.9% FLUSH
10.0000 mL | INTRAVENOUS | Status: DC | PRN
  Administered 2016-11-21: 10 mL
  Filled 2016-11-21: qty 10

## 2016-11-21 MED ORDER — SODIUM CHLORIDE 0.9% FLUSH
10.0000 mL | Freq: Once | INTRAVENOUS | Status: AC
  Administered 2016-11-21: 10 mL
  Filled 2016-11-21: qty 10

## 2016-11-21 NOTE — Patient Instructions (Signed)
[facility] Discharge Instructions for Patients Receiving Chemotherapy  Today you received the following chemotherapy agents Avastin  To help prevent nausea and vomiting after your treatment, we encourage you to take your nausea medication as directed   If you develop nausea and vomiting that is not controlled by your nausea medication, call the clinic.   BELOW ARE SYMPTOMS THAT SHOULD BE REPORTED IMMEDIATELY:  *FEVER GREATER THAN 100.5 F  *CHILLS WITH OR WITHOUT FEVER  NAUSEA AND VOMITING THAT IS NOT CONTROLLED WITH YOUR NAUSEA MEDICATION  *UNUSUAL SHORTNESS OF BREATH  *UNUSUAL BRUISING OR BLEEDING  TENDERNESS IN MOUTH AND THROAT WITH OR WITHOUT PRESENCE OF ULCERS  *URINARY PROBLEMS  *BOWEL PROBLEMS  UNUSUAL RASH Items with * indicate a potential emergency and should be followed up as soon as possible.  Feel free to call the clinic should you have any questions or concerns. The clinic phone number is [phone].  Please show the CHEMO ALERT CARD at check-in to the Emergency Department and triage nurse.   

## 2016-11-22 ENCOUNTER — Encounter: Payer: Self-pay | Admitting: Hematology and Oncology

## 2016-11-22 NOTE — Assessment & Plan Note (Signed)
She has mild anemia likely anemia chronic disease. The leukopenia and thrombocytopenia is likely due to her liver disease. She is not symptomatic.  Recommend close observation only

## 2016-11-22 NOTE — Assessment & Plan Note (Signed)
She has baseline mild liver cirrhosis I recommend minor dose adjustment to chemotherapy to reduce the risk of toxicity especially with morbid obesity

## 2016-11-22 NOTE — Progress Notes (Signed)
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  No worrisome liver mass.  There are associated portal venous collaterals and splenomegaly consistent with portal venous hypertension. 2.  No other significant upper abdominal findings      11/08/2013 Imaging    US showed ascites      11/08/2013 Initial Diagnosis    Patient had ~ 2 months of some urinary incontinence, saw Dr Ronita Hipps in 10-2013, with pelvic mass on exam       11/12/2013 Imaging    There are findings of progressive hepatic cirrhosis with a large amount of ascites and mild splenomegaly. There are venous collaterals present. 2. There is abnormal thickening of the peritoneal surface along the left mid and lower abdominal wall. There is abnormal soft tissue density in the pelvis as well which could reflect the clinically suspected ovarian malignancy but the findings are nondiagnostic. 3. There is a new small left pleural effusion. 4. There is no acute bowel abnormality.      11/18/2013 Imaging    Successful ultrasound guided diagnostic and therapeutic paracentesis yielding 4.1 liters of ascites.       11/18/2013 Pathology Results    PERITONEAL/ASCITIC FLUID (SPECIMEN 1 OF 1 COLLECTED 11/18/13): SEROUS CARCINOMA, PLEASE SEE COMMENT.      12/01/2013 Tumor Marker    Patient's tumor was tested for the following markers: CA125 Results of the tumor marker test revealed 1938      12/03/2013 Imaging    Successful ultrasound-guided therapeutic paracentesis yielding 3.6 liters of peritoneal fluid.      12/07/2013 - 05/30/2014 Chemotherapy    She received 3 cycles of carboplatin and Taxol, interrupted cycle 4 for surgery.  She  subsequently completed 3 more cycles of chemotherapy after surgery      12/20/2013 Imaging    Successful ultrasound-guided diagnostic and therapeutic paracentesis yielding 2.2 liters of peritoneal fluid      12/20/2013 Pathology Results    PERITONEAL/ASCITIC FLUID(SPECIMEN 1 OF 1 COLLECTED 12/20/13): MALIGNANT CELLS CONSISTENT WITH SEROUS CARCINOMA      01/13/2014 Tumor Marker    Patient's tumor was tested for the following markers: CA125 Results of the tumor marker test revealed 227      02/07/2014 Tumor Marker    Patient's tumor was tested for the following markers: CA125 Results of the tumor marker test revealed 130      02/15/2014 Genetic Testing    Genetics testing from 02-2014 normal ([employer] panel)      02/23/2014 Imaging    Interval decrease in ascites. 2. Morphologic changes in the liver consistent with cirrhosis. 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 Portal vein remains patent at this time. 3. Persistent but decreased abnormal soft tissue attenuation tracking in the omental fat. This may be secondary to interval improvement in metastatic disease. 4. Peritoneal thickening seen in the left abdomen and pelvis on the previous study has decreased and nearly resolved in the interval. This also suggests interval improvement in metastatic disease.       03/07/2014 Procedure    Ultrasound and fluoroscopically guided right internal jugular single lumen power port catheter insertion. Tip in the SVC/RA junction. Catheter ready for use.      03/17/2014  Tumor Marker    Patient's tumor was tested for the following markers: CA125 Results of the tumor marker test revealed 45      03/29/2014 Pathology Results    1. Omentum, resection for tumor - HIGH GRADE SEROUS CARCINOMA, SEE COMMENT. 2. Ovary and fallopian tube, right - HIGH GRADE SEROUS CARCINOMA, SEE COMMENT. 3. Ovary and fallopian tube, left - HIGH GRADE SEROUS CARCINOMA, SEE  COMMENT. Diagnosis Note 1. Nests and clusters of malignant cells are invading the omental tissue (part #1) with associated fibrosis. The cells are pleomorphic with prominent nucleoli. There are scattered psammoma bodies. The ovaries are atrophic and exhibit multiple foci of surface based invasive carcinoma and associated fibrosis. The fallopian tubes have a few foci of carcinoma, also superficially located. There are no precursor lesions noted in the ovaries or fallopian tubes (the entire tubes and ovaries were submitted for evaluation). While there is some retraction artifact, there are several foci suspicious for lymphovascular invasion. 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 Given the fibrosis, there does appear to be a small amount of treatment effect, however, there is abundant residual tumor.       03/29/2014 Surgery    Procedure(s) Performed: 1. Exploratory laparotomy with bilateral salpingo-oophorectomy, omentectomy radical tumor debulking for ovarian cancer .  Surgeon: Thereasa Solo, MD.  Assistant Surgeon: Lahoma Crocker, M.D. Assistant: (an MD assistant was necessary for tissue manipulation, retraction and positioning due to the complexity of the case and hospital policies).  Operative Findings: 10cm omental cake from hepatic to splenic flexure, densely adherent to transverse colon. Milliary studding of tumor implants (<2m, too numerous in number to count) adherent to the mesentery of the small bowel and small bowel wall. Small volume (100cc) ascites. Small ovaries bilaterally, densely adherent to the pelvic cul de sac. Left ovary and tube densely adherent to sigmoid colon. Cirrhotic liver with hepatomegaly and splenomegaly.     This represented an optimal cytoreduction (R1) with visible disease residual on the bowel wall and mesentery (millial, <339m implants).        04/18/2014 Tumor Marker    Patient's tumor was tested for the following markers: CA125 Results of the tumor marker test revealed 122      05/16/2014 Tumor Marker    Patient's tumor was tested for the following markers: CA125 Results of the tumor marker test revealed 30      06/10/2014 Tumor Marker    Patient's tumor was tested for the following markers: CA125 Results of the tumor marker test revealed 19      07/04/2014 Imaging    Interval improvement in the appearance of peritoneal metastasis secondary to ovarian cancer. 2. 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       08/18/2014 Tumor Marker    Patient's tumor was tested for the following markers: CA125 Results of the tumor marker test revealed 16      10/13/2014 Tumor Marker    Patient's tumor was tested for the following markers: CA125 Results of the tumor marker test revealed 14      11/21/2014 Tumor Marker    Patient's tumor was tested for the following markers: CA125 Results of the tumor marker test revealed 16      12/28/2014 Tumor Marker    Patient's tumor was tested for the following markers: CA125 Results of the tumor marker test revealed 762  02/27/2015 Tumor Marker    Patient's tumor was tested for the following markers: CA125 Results of the tumor marker test revealed 20.2      03/22/2015 Imaging    Filler appearance to the prior exam, with very subtle fluid tracking along portions of the liver, and very minimal nodularity along the right paracolic gutter representing residua from the prior peritoneal cancer. The current abnormalities could simply be post therapy findings rather than necessarily representing residual malignancy. No new or enlarging lesions are identified. 2. Hepatic cirrhosis and splenomegaly. There is some gastric varices suggesting portal venous hypertension. 3. Left foraminal stenosis at L4-5 due to spurring. There is likely also some central narrowing  of the thecal sac at this level. 4. Bibasilar scarring. 5. Chronic mild left mid kidney scarring      03/27/2015 Tumor Marker    Patient's tumor was tested for the following markers: CA125 Results of the tumor marker test revealed 25.3      04/24/2015 Imaging    Disc bulge L2-3 with mild spinal stenosis and right foraminal encroachment. 2. Disc bulge L3-4 with mild spinal stenosis. 3. Multifactorial spinal, left lateral recess and foraminal stenosis L4-5. There is grade 1 anterolisthesis without evident dynamic instability.      05/29/2015 Tumor Marker    Patient's tumor was tested for the following markers: CA125 Results of the tumor marker test revealed 29.9      08/21/2015 Tumor Marker    Patient's tumor was tested for the following markers: CA125 Results of the tumor marker test revealed 45      09/18/2015 Tumor Marker    Patient's tumor was tested for the following markers: CA125 Results of the tumor marker test revealed 47      09/27/2015 Imaging    New small bowel mesenteric nodule and enlarged left external iliac lymph node, highly worrisome for metastatic disease. 2. Cirrhosis and splenomegaly      10/10/2015 Tumor Marker    Patient's tumor was tested for the following markers: CA125 Results of the tumor marker test revealed 53.4      10/10/2015 - 12/11/2015 Chemotherapy    She received 4 cycles of carboplatin only      11/20/2015 Tumor Marker    Patient's tumor was tested for the following markers: CA125 Results of the tumor marker test revealed 41.3      12/20/2015 Imaging    CT: Mild left external iliac lymphadenopathy is slightly decreased. Mildly enlarged lower mesenteric nodule is slightly decreased. 2. No new or progressive metastatic disease in the abdomen or pelvis. 3. Cirrhosis. Stable subcentimeter hypodense left liver lobe lesion. No new liver lesions. 4. Stable mild splenomegaly.  No ascites. 5. Mild sigmoid diverticulosis.      01/01/2016 -  Anti-estrogen  oral therapy    She was placed on tamoxifen. Had letrozole initially but switched to tamoxifen due to poor tolerance      01/15/2016 Tumor Marker    Patient's tumor was tested for the following markers: CA125 Results of the tumor marker test revealed 31.4      02/19/2016 Tumor Marker    Patient's tumor was tested for the following markers: CA125 Results of the tumor marker test revealed 36.5      05/13/2016 Imaging    No evidence of disease progression within the abdomen or pelvis. Previously noted small central mesenteric nodule and left external iliac node appear slightly smaller. 2. Stable changes of hepatic cirrhosis and portal hypertension with associated splenomegaly. No new or  enlarging hepatic lesions are identified. 3. No acute findings.      05/13/2016 Tumor Marker    Patient's tumor was tested for the following markers: CA125 Results of the tumor marker test revealed 41.2      06/24/2016 Tumor Marker    Patient's tumor was tested for the following markers: CA125 Results of the tumor marker test revealed 47.3      08/20/2016 Imaging    1. The left external iliac lymph node is slightly increased in size compared to the prior exam, currently 1.1 cm and previously 0.9 cm in diameter. 2. Stable central mesenteric lymph node at 0.9 cm in diameter. 3. Stable trace free pelvic fluid and stable slight thickening along the right paracolic gutter, without well-defined peritoneal nodularity. 4. Other imaging findings of potential clinical significance: Subsegmental atelectasis or scarring in the lung bases. Hepatic cirrhosis. Left renal scarring. Mild splenomegaly. Sigmoid colon diverticulosis. Impingement at L4-5 due to spondylosis and degenerative disc disease broad Schmorl' s nodes at L3-L4. Pelvic floor laxity      08/23/2016 Tumor Marker    Patient's tumor was tested for the following markers: CA125 Results of the tumor marker test revealed 80.2      09/04/2016 Imaging    LV EF: 60%  -   65%      09/12/2016 Tumor Marker    Patient's tumor was tested for the following markers: CA125 Results of the tumor marker test revealed 98.5      09/12/2016 -  Chemotherapy    The patient received Doxil and Avastin      09/26/2016 Tumor Marker    Patient's tumor was tested for the following markers: CA125 Results of the tumor marker test revealed 68.9      10/10/2016 Tumor Marker    Patient's tumor was tested for the following markers: CA125 Results of the tumor marker test revealed 65.6      11/07/2016 Tumor Marker    Patient's tumor was tested for the following markers: CA125 Results of the tumor marker test revealed 46.4       INTERVAL HISTORY: Please see below for problem oriented charting. She is seen prior to cycle 3 of treatment Her symptoms are about the same She has poorly controlled diabetes after each cycle of treatment She denies nausea or vomiting She denies worsening peripheral neuropathy She is taking gabapentin and instructed and continues to take intermittent pain medications as needed She denies chest pain or shortness of breath The patient denies any recent signs or symptoms of bleeding such as spontaneous epistaxis, hematuria or hematochezia.  REVIEW OF SYSTEMS:   Constitutional: Denies fevers, chills or abnormal weight loss Eyes: Denies blurriness of vision Ears, nose, mouth, throat, and face: Denies mucositis or sore throat Respiratory: Denies cough, dyspnea or wheezes Cardiovascular: Denies palpitation, chest discomfort or lower extremity swelling Gastrointestinal:  Denies nausea, heartburn or change in bowel habits Skin: Denies abnormal skin rashes Lymphatics: Denies new lymphadenopathy or easy bruising Neurological:Denies numbness, tingling or new weaknesses Behavioral/Psych: Mood is stable, no new changes  All other systems were reviewed with the patient and are negative.  I have reviewed the past medical history, past surgical history,  social history and family history with the patient and they are unchanged from previous note.  ALLERGIES:  is allergic to shellfish allergy; benadryl [diphenhydramine hcl]; penicillins; and tape.  MEDICATIONS:  Current Outpatient Prescriptions  Medication Sig Dispense Refill  . amLODipine (NORVASC) 5 MG tablet Take 5 mg by mouth daily.  0  . aspirin EC 81 MG tablet Take 81 mg by mouth daily.    . bisoprolol (ZEBETA) 5 MG tablet   0  . cholecalciferol (VITAMIN D) 1000 units tablet Take 1,000 Units by mouth daily.    Marland Kitchen gabapentin (NEURONTIN) 300 MG capsule Take 2 capsules (600 mg total) by mouth 2 (two) times daily. 90 capsule 3  . HYDROcodone-acetaminophen (NORCO/VICODIN) 5-325 MG tablet take 1 tablet by mouth every 6 hours if needed for MODERATE PAIN 60 tablet 0  . insulin degludec (TRESIBA) 100 UNIT/ML SOPN FlexTouch Pen Inject 13 Units into the skin daily.     Marland Kitchen lidocaine-prilocaine (EMLA) cream Apply to Porta-cath  1-2 hours prior to access as directed. 30 g 1  . metFORMIN (GLUCOPHAGE-XR) 500 MG 24 hr tablet Take 1,000 mg by mouth 2 (two) times daily.   0  . ondansetron (ZOFRAN) 8 MG tablet Take 1 tablet (8 mg total) by mouth every 8 (eight) hours as needed for nausea. 30 tablet 3  . pantoprazole (PROTONIX) 40 MG tablet Take 40 mg by mouth daily.   0  . promethazine (PHENERGAN) 25 MG tablet Take 1 tablet (25 mg total) by mouth every 6 (six) hours as needed for nausea. 30 tablet 3  . rotigotine (NEUPRO) 4 MG/24HR Place 1 patch onto the skin daily. 30 patch 5   No current facility-administered medications for this visit.     PHYSICAL EXAMINATION: ECOG PERFORMANCE STATUS: 1 - Symptomatic but completely ambulatory  Vitals:   11/21/16 0819  BP: (!) 142/53  Pulse: 75  Resp: 20  Temp: 98.3 F (36.8 C)  SpO2: 99%   Filed Weights   11/21/16 0819  Weight: 195 lb 4.8 oz (88.6 kg)    GENERAL:alert, no distress and comfortable SKIN: skin color, texture, turgor are normal, no rashes or  significant lesions EYES: normal, Conjunctiva are pink and non-injected, sclera clear OROPHARYNX:no exudate, no erythema and lips, buccal mucosa, and tongue normal  NECK: supple, thyroid normal size, non-tender, without nodularity LYMPH:  no palpable lymphadenopathy in the cervical, axillary or inguinal LUNGS: clear to auscultation and percussion with normal breathing effort HEART: regular rate & rhythm and no murmurs and no lower extremity edema ABDOMEN:abdomen soft, non-tender and normal bowel sounds Musculoskeletal:no cyanosis of digits and no clubbing  NEURO: alert & oriented x 3 with fluent speech, no focal motor/sensory deficits  LABORATORY DATA:  I have reviewed the data as listed    Component Value Date/Time   NA 140 11/21/2016 0757   K 4.0 11/21/2016 0757   CL 105 03/30/2014 0618   CO2 20 (L) 11/21/2016 0757   GLUCOSE 253 (H) 11/21/2016 0757   BUN 11.8 11/21/2016 0757   CREATININE 0.9 11/21/2016 0757   CALCIUM 8.2 (L) 11/21/2016 0757   PROT 6.2 (L) 11/21/2016 0757   ALBUMIN 3.3 (L) 11/21/2016 0757   AST 52 (H) 11/21/2016 0757   ALT 53 11/21/2016 0757   ALKPHOS 119 11/21/2016 0757   BILITOT 0.54 11/21/2016 0757   GFRNONAA 86 (L) 03/30/2014 0618   GFRAA >90 03/30/2014 0618    No results found for: SPEP, UPEP  Lab Results  Component Value Date   WBC 3.0 (L) 11/21/2016   NEUTROABS 2.1 11/21/2016   HGB 9.0 (L) 11/21/2016   HCT 28.5 (L) 11/21/2016   MCV 91.9 11/21/2016   PLT 100 (L) 11/21/2016      Chemistry      Component Value Date/Time   NA 140 11/21/2016 0757  K 4.0 11/21/2016 0757   CL 105 03/30/2014 0618   CO2 20 (L) 11/21/2016 0757   BUN 11.8 11/21/2016 0757   CREATININE 0.9 11/21/2016 0757      Component Value Date/Time   CALCIUM 8.2 (L) 11/21/2016 0757   ALKPHOS 119 11/21/2016 0757   AST 52 (H) 11/21/2016 0757   ALT 53 11/21/2016 0757   BILITOT 0.54 11/21/2016 0757      ASSESSMENT & PLAN:  Primary peritoneal carcinomatosis ([place]) She  tolerated treatment well with positive response to treatment, documented by improved tumor markers I plan to repeat echocardiogram and CT imaging before I see her back for cycle 4   Pancytopenia, acquired ([place]) She has mild anemia likely anemia chronic disease. The leukopenia and thrombocytopenia is likely due to her liver disease. She is not symptomatic.  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  I would defer to her for further management  Liver cirrhosis secondary to NASH [facility]) She has baseline mild liver cirrhosis I recommend minor dose adjustment to chemotherapy to reduce the risk of toxicity especially with morbid obesity  Chemotherapy-induced neuropathy ([place]) She had mild worsening peripheral neuropathy which I do not think is related to recent chemo It is possible due to poorly controlled diabetes We will continue gabapentin and pain management   Orders Placed This Encounter  Procedures  . CT ABDOMEN PELVIS W CONTRAST    Standing Status:   Future    Standing Expiration Date:   11/21/2017    Order Specific Question:   If indicated for the ordered procedure, I authorize the administration of contrast media per Radiology protocol    Answer:   Yes    Order Specific Question:   Preferred imaging location?    Answer:   [facility]    Order Specific Question:   Radiology Contrast Protocol - do NOT remove file path    Answer:   [email or web address]  . Ferritin    Standing Status:   Future    Standing Expiration Date:   11/21/2017  . Iron and TIBC    Standing Status:   Future    Standing Expiration Date:   12/26/2017  . ECHOCARDIOGRAM COMPLETE    Standing Status:   Future    Standing Expiration Date:   02/20/2018    Order Specific  Question:   Where should this test be performed    Answer:   [name]    Order Specific Question:   Complete or Limited study?    Answer:   Limited    Order Specific Question:   With Image Enhancing Agent or without Image Enhancing Agent?    Answer:   With Image Enhancing Agent    Order Specific Question:   Reason for exam-Echo    Answer:   Chemo  V67.2 / Z09   All questions were answered. The patient knows to call the clinic with any problems, questions or concerns. No barriers to learning was detected. I spent 30 minutes counseling the patient face to face. The total time spent in the appointment was 40 minutes and more than 50% was on counseling and review of test results     [name], MD 11/22/2016 11:01 AM

## 2016-11-22 NOTE — Assessment & Plan Note (Signed)
She had mild worsening peripheral neuropathy which I do not think is related to recent chemo It is possible due to poorly controlled diabetes We will continue gabapentin and pain management

## 2016-11-22 NOTE — Assessment & Plan Note (Signed)
She tolerated treatment well with positive response to treatment, documented by improved tumor markers I plan to repeat echocardiogram and CT imaging before I see her back for cycle 4

## 2016-11-22 NOTE — Assessment & Plan Note (Signed)
She has poorly controlled diabetes for the first few days after each cycle of chemo Her primary care doctor increase the dose of insulin recently.  I would defer to her for further management

## 2016-11-29 ENCOUNTER — Ambulatory Visit (HOSPITAL_COMMUNITY)
Admission: RE | Admit: 2016-11-29 | Discharge: 2016-11-29 | Disposition: A | Discharge status: Home / Self Care | Payer: Medicare Other | Source: Ambulatory Visit | Attending: Hematology and Oncology | Admitting: Hematology and Oncology

## 2016-11-29 DIAGNOSIS — Z5111 Encounter for antineoplastic chemotherapy: Secondary | ICD-10-CM | POA: Insufficient documentation

## 2016-11-29 DIAGNOSIS — K5730 Diverticulosis of large intestine without perforation or abscess without bleeding: Secondary | ICD-10-CM | POA: Diagnosis not present

## 2016-11-29 DIAGNOSIS — K7460 Unspecified cirrhosis of liver: Secondary | ICD-10-CM | POA: Diagnosis not present

## 2016-11-29 DIAGNOSIS — R161 Splenomegaly, not elsewhere classified: Secondary | ICD-10-CM | POA: Insufficient documentation

## 2016-11-29 DIAGNOSIS — C482 Malignant neoplasm of peritoneum, unspecified: Secondary | ICD-10-CM | POA: Diagnosis not present

## 2016-11-29 DIAGNOSIS — C569 Malignant neoplasm of unspecified ovary: Secondary | ICD-10-CM | POA: Diagnosis not present

## 2016-11-29 MED ORDER — IOPAMIDOL (ISOVUE-300) INJECTION 61%
30.0000 mL | Freq: Once | INTRAVENOUS | Status: AC | PRN
  Administered 2016-11-29: 30 mL via ORAL

## 2016-11-29 MED ORDER — IOPAMIDOL (ISOVUE-300) INJECTION 61%
100.0000 mL | Freq: Once | INTRAVENOUS | Status: AC | PRN
  Administered 2016-11-29: 100 mL via INTRAVENOUS

## 2016-11-29 MED ORDER — HEPARIN SOD (PORK) LOCK FLUSH 100 UNIT/ML IV SOLN: 500.0000 [IU] | Freq: Once | INTRAVENOUS | Status: DC

## 2016-11-29 MED ORDER — IOPAMIDOL (ISOVUE-300) INJECTION 61%
INTRAVENOUS | Status: AC
  Administered 2016-11-29: 30 mL via ORAL
  Filled 2016-11-29: qty 30

## 2016-11-29 MED ORDER — HEPARIN SOD (PORK) LOCK FLUSH 100 UNIT/ML IV SOLN
INTRAVENOUS | Status: AC
  Administered 2016-11-29: 500 [IU]
  Filled 2016-11-29: qty 5

## 2016-11-29 MED ORDER — IOPAMIDOL (ISOVUE-300) INJECTION 61%
INTRAVENOUS | Status: AC
  Filled 2016-11-29: qty 100

## 2016-12-02 ENCOUNTER — Ambulatory Visit (HOSPITAL_COMMUNITY)
Admission: RE | Admit: 2016-12-02 | Discharge: 2016-12-02 | Disposition: A | Discharge status: Home / Self Care | Payer: Medicare Other | Source: Ambulatory Visit | Attending: Hematology and Oncology | Admitting: Hematology and Oncology

## 2016-12-02 DIAGNOSIS — I10 Essential (primary) hypertension: Secondary | ICD-10-CM | POA: Diagnosis not present

## 2016-12-02 DIAGNOSIS — C482 Malignant neoplasm of peritoneum, unspecified: Secondary | ICD-10-CM | POA: Insufficient documentation

## 2016-12-02 DIAGNOSIS — Z09 Encounter for follow-up examination after completed treatment for conditions other than malignant neoplasm: Secondary | ICD-10-CM | POA: Insufficient documentation

## 2016-12-02 DIAGNOSIS — R0600 Dyspnea, unspecified: Secondary | ICD-10-CM | POA: Insufficient documentation

## 2016-12-02 DIAGNOSIS — C569 Malignant neoplasm of unspecified ovary: Secondary | ICD-10-CM | POA: Insufficient documentation

## 2016-12-02 DIAGNOSIS — Z5111 Encounter for antineoplastic chemotherapy: Secondary | ICD-10-CM | POA: Diagnosis not present

## 2016-12-02 DIAGNOSIS — E119 Type 2 diabetes mellitus without complications: Secondary | ICD-10-CM | POA: Diagnosis not present

## 2016-12-02 DIAGNOSIS — I252 Old myocardial infarction: Secondary | ICD-10-CM | POA: Insufficient documentation

## 2016-12-02 NOTE — Progress Notes (Signed)
Olivia Benson was seen today in neurologic consultation at the request of Dr. Marko Plume. Her PCP is Olivia Rima, MD.  The consultation is for RLS.  Prior records were reviewed.  The patient also has a history of primary peritoneal carcinomatosis, grade 3.  She has undergone previous debulking surgery and chemotherapy with carboplatin and Taxol, which was last completed on 05/30/2014.  Her chemotherapy has been complicated by neutropenia, peripheral neuropathy, symptomatically anemia and fatigue.  She presents today to discuss restless leg syndrome.  Pt states that her sx's consist of a crawling sensation on the legs; she feels that she has a spider web on the leg; "I just have to move."  It may have started pre-chemo but got much worse after chemo.  It starts mid afternoon once she sits and "gets quiet" and tries to watch TV.    She has previously tried flexeril, baclofen, ultram, restoril and oxycontin.  The only one that has been helpful is oxyIR.  She has never been on requip, mirapex, levodopa, klonopin.    She admits that she craves ice.  She has been anemic.  Her last Hgb was 10.6.    11/25/14 update:  The patient is following up today in regards to her restless leg syndrome that is likely secondary to iron deficiency anemia.  Last visit, I started her on pramipexole, 0.125 mg after dinner and 0.25 mg at night. When she wakes up at night, she has "bone pain" but no RLS sx's.  She is still on one oxycodone at night for the pain but has dropped that from 2 tablets.   I was able to get a copy of her TSH from January, 2015 and it was normal at 1.820.  Dr. Marko Plume did start her on oral iron after our last visit but unfortunately the patient did not tolerate that because of nausea and it was recommended that the patient try instead a multivitamin with iron and she couldn't tolerate that either.  Said that she actually tolerated oral iron better than MVI.  Said that her ice craving went away.  05/24/15  update:  The patient follows up today in regards to restless leg syndrome.  She is on pramipexole, 0.125 mg after dinner and 0.25 mg at night.  She has been worse but isn't sure if that is because she is no longer on her oxycontin.  Admits she isn't taking her iron supplements.   She isn't sleeping at all now.  Eating ice again.  She did get married since our last visit, but reports that she has been with the same gentleman for a long time.  States that her weight and BS have both been going up and she is frustrated with that.  She has had some arthritic pain and the indocin helps but she had some stomach pain.  07/28/15 update:  The patient is seen today in follow-up for restless leg syndrome, which has been secondary to iron deficiency.  Last visit, she was complaining about worsening symptoms and increased her pramipexole 0.5 mg, so that she was taking a half a tablet after dinner and one tablet at night.  She complains that this medication makes her cough and causes SOB so she only takes 1/2 after dinner and 1/2 at night. Last visit, I  also checked her iron, and the percent saturation was low at 7% and total iron was low at 34.  I told her that she absolutely needed to restart her iron supplements.  She  states that she is taking this and "it isn't helping and my cravings for ice is increasing."  Admits that she overall isn't feeling good.  "I have pain all over."  No taking diabetes meds as prescribed.    11/27/15 update:  Pt f/u today for RLS.  She thought that mirapex caused SOB and cough and although that would be very unusual, we d/c that last visit and changed it to neupro patch.  I thought that her iron was likely low last visit and I rechecked it, and it indeed was low.  Through Dr. Marko Plume, IV iron was arranged for the patient.  She felt that she did amazing after the 2nd infusion of iron.  She is now off of the iron.  She is also off of the patch because she is in the donut hole.  She quit the IV  iron because she started chemo.  She is starting to crave ice again but not like she was last visit and not SOB like she was before last visit. The records that were made available to me were reviewed.  Unfortunately, pt had recurrence of her primary peritoneal carcinomatosis since our last visit.  Her chemo was restarted.  04/02/16 update: Patient seen today in follow-up.  She was given samples of the Neupro patch last visit, 1 mg for her restless leg.  She had repeat ferritin and iron studies on 02/19/2016 and were unremarkable.  States that she really was doing well until started on tamoxifen and then the RLS started acting up.  She is having cramping of the legs as well and has been taking a significant amount of advil (8-12 per day).  07/31/16 update:  Patient in today in follow-up.  Her Neupro patch was increased last visit her restless leg to 2 mg daily.  She states today that she didn't think that it was helping but she forgot it one day and then "I paced the floors and cried."  Has been f/u with Dr. Alvy Bimler and I reviewed those records.  No tumor progression.  Tumors markers fluctuated somewhat.  On tamoxifen.  She did get rid of her advil but is taking hydrocodone.  She is still craving ice but cannot tolerate the iron supplements so isn't taking supplements  12/04/16 update: Patient is seen today in follow-up for Parkinson's disease.  Patient has a history of restless leg syndrome secondary to iron deficiency anemia.  She is on the Neupro patch, which was increased to 4 mg daily last visit.  "That is working better."  She forgot it one day and "I wanted to hurt someone."   Dr. Alvy Bimler ordered ferritin, iron and TIBC but I do not see that those have been completed yet.  Pt states that "I will have them done tomorrow because I am eating ice again."  Started chemo in August and RLS sx's got worse.  Eating so much ice that has separate ice machine.  Is taking much less ibuprofen now.     PREVIOUS  MEDICATIONS: n/a  ALLERGIES:   Allergies  Allergen Reactions  . Shellfish Allergy Anaphylaxis    HER THROAT SWELLS UP AND CLOSES  . Benadryl [Diphenhydramine Hcl] Other (See Comments)    Restless legs   . Penicillins Rash    WHEN SHE WAS TEENAGER  . Tape Itching    Red itch with some tapes    CURRENT MEDICATIONS:  Outpatient Encounter Prescriptions as of 12/04/2016  Medication Sig  . amLODipine (NORVASC) 5 MG tablet Take  5 mg by mouth daily.  Marland Kitchen aspirin EC 81 MG tablet Take 81 mg by mouth daily.  . bisoprolol (ZEBETA) 5 MG tablet   . cholecalciferol (VITAMIN D) 1000 units tablet Take 1,000 Units by mouth daily.  Marland Kitchen gabapentin (NEURONTIN) 300 MG capsule Take 2 capsules (600 mg total) by mouth 2 (two) times daily.  Marland Kitchen HYDROcodone-acetaminophen (NORCO/VICODIN) 5-325 MG tablet take 1 tablet by mouth every 6 hours if needed for MODERATE PAIN  . insulin degludec (TRESIBA) 100 UNIT/ML SOPN FlexTouch Pen Inject 18 Units into the skin daily.   Marland Kitchen lidocaine-prilocaine (EMLA) cream Apply to Porta-cath  1-2 hours prior to access as directed.  . metFORMIN (GLUCOPHAGE-XR) 500 MG 24 hr tablet Take 1,000 mg by mouth 2 (two) times daily.   . ondansetron (ZOFRAN) 8 MG tablet Take 1 tablet (8 mg total) by mouth every 8 (eight) hours as needed for nausea.  . pantoprazole (PROTONIX) 40 MG tablet Take 40 mg by mouth daily.   . promethazine (PHENERGAN) 25 MG tablet Take 1 tablet (25 mg total) by mouth every 6 (six) hours as needed for nausea.  . rotigotine (NEUPRO) 4 MG/24HR Place 1 patch onto the skin daily.   No facility-administered encounter medications on file as of 12/04/2016.     PAST MEDICAL HISTORY:   Past Medical History:  Diagnosis Date  . Asthma   . Cirrhosis ([place])   . Complication of anesthesia    slow to wake up / n & v  . Diabetes mellitus   . Elevated LFTs   . Family history of adverse reaction to anesthesia    multiple family members have had problems such as slow to wake / "flat  lined" /  ventilator    . Frequency of urination   . Glaucoma, narrow-angle   . History of skin cancer   . Hypertension   . Non-ST elevated myocardial infarction (non-STEMI) [facility]) Sept 2012   Mild elevation in troponin and CK MB but no evidence of coronary disease at time of cath  . Obesity   . Ovarian cancer ([place]) 11/22/2013   Disseminated ovarian cancer  . Overactive bladder   . PONV (postoperative nausea and vomiting)   . Portal hypertension ([place])   . Restless leg syndrome   . Tingling    hands and feet    PAST SURGICAL HISTORY:   Past Surgical History:  Procedure Laterality Date  . ABDOMINAL ULTRASOUND  Sept 2012   No gallbladder disease, + fatty liver  . CARDIAC CATHETERIZATION  10/25/2010   EF 60%; Normal coronaries  . COLONOSCOPY  10/18/2005  . COSMETIC SURGERY  2003  . DENTAL SURGERY    . EYE SURGERY Bilateral 09/19/14 R eye, 08/2014 L eye   open angles in both eyes d/t narrow angle glaucoma  . LAPAROTOMY N/A 03/29/2014   Procedure: EXPLORATORY LAPAROTOMY;  Surgeon: Everitt Amber, MD;  Location: [facility];  Service: Gynecology;  Laterality: N/A;  . PARTIAL HYSTERECTOMY  1988  . SALPINGOOPHORECTOMY Bilateral 03/29/2014   Procedure: BILATERAL SALPINGO OOPHORECTOMY, OMENTECTOMY, DEBULKING;  Surgeon: Everitt Amber, MD;  Location: [facility];  Service: Gynecology;  Laterality: Bilateral;  . TONSILLECTOMY  1971  . TUBAL LIGATION    . [facility]  11/24/2009    SOCIAL HISTORY:   Social History   Social History  . Marital status: Married    Spouse name: N/A  . Number of children: 1 D  . Years of education: N/A   Occupational History  . Retired  Social History Main Topics  . Smoking status: Former Smoker    Packs/day: 1.00    Years: 1.00    Types: Cigarettes    Quit date: 02/11/1982  . Smokeless tobacco: Never Used  . Alcohol use No  . Drug use: No  . Sexual activity: Not on file   Other Topics Concern  . Not on file   Social History Narrative  . No narrative on file      FAMILY HISTORY:   Family Status  Relation Status  . Mother Alive       arthritis, htn, DM  . Father Deceased at age 29       bladder cancer, stroke  . Sister Alive       breast cancer  . Sister Alive       htn  . Sister Alive       htn  . Sister Alive       htn  . Mat [employer]  . Cousin Alive  . Mat Uncle Deceased at age infancy  . MGM Deceased at age 80       pneumonia  . MGF Deceased  . PGM Deceased  . PGF Deceased  . Mat [employer]  . Mat [employer]  . Mat [employer]  . Son Deceased at age 86       died of brain anerysm, after a 20 ft fall  . Brother Scientist, forensic  . Brother (Not Specified)    ROS:  A complete 10 system review of systems was obtained and was unremarkable apart from what is mentioned above.  PHYSICAL EXAMINATION:    VITALS:   Vitals:   12/04/16 1100  BP: 138/72  Pulse: 75  SpO2: 98%  Weight: 194 lb (88 kg)  Height: 5' 1.5" (1.562 m)    GEN:  Normal appears female in no acute distress.  Appears stated age. HEENT:  Normocephalic, atraumatic. The mucous membranes are moist. The superficial temporal arteries are without ropiness or tenderness. Cardiovascular: Regular rate and rhythm. Lungs: Clear to auscultation bilaterally. Neck/Heme: There are no carotid bruits noted bilaterally.  NEUROLOGICAL: Orientation:  The patient is alert and oriented x 3.   Cranial nerves: There is good facial symmetry.  Speech is fluent and clear. Soft palate rises symmetrically and there is no tongue deviation. Hearing is intact to conversational tone. Tone: Tone is good throughout. Sensation: Sensation is intact to light touch throughout Coordination:  The patient has no difficulty with RAM's or FNF bilaterally. Motor: Strength is at least antigravity x4. Gait and Station: The patient is able to ambulate without difficulty.   Lab Results  Component Value Date   WBC 3.0 (L) 11/21/2016   HGB 9.0 (L) 11/21/2016   HCT 28.5 (L) 11/21/2016   MCV 91.9  11/21/2016   PLT 100 (L) 11/21/2016   Lab Results  Component Value Date   TSH 1.119 10/13/2014   Lab Results  Component Value Date   IRON 42 08/05/2016   TIBC 464 (H) 08/05/2016   FERRITIN 16 08/05/2016     Chemistry      Component Value Date/Time   NA 140 11/21/2016 0757   K 4.0 11/21/2016 0757   CL 105 03/30/2014 0618   CO2 20 (L) 11/21/2016 0757   BUN 11.8 11/21/2016 0757   CREATININE 0.9 11/21/2016 0757      Component Value Date/Time   CALCIUM 8.2 (L) 11/21/2016 0757   ALKPHOS 119 11/21/2016  0757   AST 52 (H) 11/21/2016 0757   ALT 53 11/21/2016 0757   BILITOT 0.54 11/21/2016 0757        IMPRESSION/PLAN  1. RLS, secondary to iron def anemia  -I think she is likely more deficient again.  She is having pica (eating ice).  She is to have her ferritin and iron rechecked again tomorrow.  I think she probably will need an iron infusion.  I would like to see her on supplements, but she just does not want to take those.  -She will continue with the Neupro patch, 4 mg daily.  Unfortunately, this is quite expensive but she thinks that the cost is worth it.  2.  LE cramping  -she couldn't tolerate tonic water.  On hydrocodone now.  She has greatly decreased her Advil use and I congratulated her on that.  3.  I will plan on seeing her back in the next 6 months, sooner should new neurologic issues arise.

## 2016-12-02 NOTE — Progress Notes (Signed)
  Echocardiogram Echocardiogram Stress Test has been performed.  Olivia Benson 12/02/2016, 9:42 AM

## 2016-12-03 ENCOUNTER — Encounter: Payer: Self-pay | Admitting: Hematology and Oncology

## 2016-12-03 ENCOUNTER — Other Ambulatory Visit: Payer: Self-pay | Admitting: Hematology and Oncology

## 2016-12-03 ENCOUNTER — Ambulatory Visit (HOSPITAL_BASED_OUTPATIENT_CLINIC_OR_DEPARTMENT_OTHER): Payer: Medicare Other | Admitting: Hematology and Oncology

## 2016-12-03 DIAGNOSIS — I427 Cardiomyopathy due to drug and external agent: Secondary | ICD-10-CM | POA: Diagnosis not present

## 2016-12-03 DIAGNOSIS — C482 Malignant neoplasm of peritoneum, unspecified: Secondary | ICD-10-CM

## 2016-12-03 DIAGNOSIS — D61810 Antineoplastic chemotherapy induced pancytopenia: Secondary | ICD-10-CM

## 2016-12-03 DIAGNOSIS — T451X5A Adverse effect of antineoplastic and immunosuppressive drugs, initial encounter: Secondary | ICD-10-CM | POA: Insufficient documentation

## 2016-12-03 DIAGNOSIS — I10 Essential (primary) hypertension: Secondary | ICD-10-CM

## 2016-12-03 DIAGNOSIS — D61818 Other pancytopenia: Secondary | ICD-10-CM

## 2016-12-03 DIAGNOSIS — Z7189 Other specified counseling: Secondary | ICD-10-CM | POA: Diagnosis not present

## 2016-12-03 NOTE — Assessment & Plan Note (Signed)
Her recent acquired pancytopenia is due to chemotherapy With discontinuation of Doxil, I suspect it would improve in time

## 2016-12-03 NOTE — Progress Notes (Signed)
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  No worrisome liver mass.  There are associated portal venous collaterals and splenomegaly consistent with portal venous hypertension. 2.  No other significant upper abdominal findings      11/08/2013 Imaging    US showed ascites      11/08/2013 Initial Diagnosis    Patient had ~ 2 months of some urinary incontinence, saw Dr Ronita Hipps in 10-2013, with pelvic mass on exam       11/12/2013 Imaging    There are findings of progressive hepatic cirrhosis with a large amount of ascites and mild splenomegaly. There are venous collaterals present. 2. There is abnormal thickening of the peritoneal surface along the left mid and lower abdominal wall. There is abnormal soft tissue density in the pelvis as well which could reflect the clinically suspected ovarian malignancy but the findings are nondiagnostic. 3. There is a new small left pleural effusion. 4. There is no acute bowel abnormality.      11/18/2013 Imaging    Successful ultrasound guided diagnostic and therapeutic paracentesis yielding 4.1 liters of ascites.       11/18/2013 Pathology Results    PERITONEAL/ASCITIC FLUID (SPECIMEN 1 OF 1 COLLECTED 11/18/13): SEROUS CARCINOMA, PLEASE SEE COMMENT.      12/01/2013 Tumor Marker    Patient's tumor was tested for the following markers: CA125 Results of the tumor marker test revealed 1938      12/03/2013 Imaging    Successful ultrasound-guided therapeutic paracentesis yielding 3.6 liters of peritoneal fluid.      12/07/2013 - 05/30/2014 Chemotherapy    She received 3 cycles of carboplatin and Taxol, interrupted cycle 4 for surgery.  She  subsequently completed 3 more cycles of chemotherapy after surgery      12/20/2013 Imaging    Successful ultrasound-guided diagnostic and therapeutic paracentesis yielding 2.2 liters of peritoneal fluid      12/20/2013 Pathology Results    PERITONEAL/ASCITIC FLUID(SPECIMEN 1 OF 1 COLLECTED 12/20/13): MALIGNANT CELLS CONSISTENT WITH SEROUS CARCINOMA      01/13/2014 Tumor Marker    Patient's tumor was tested for the following markers: CA125 Results of the tumor marker test revealed 227      02/07/2014 Tumor Marker    Patient's tumor was tested for the following markers: CA125 Results of the tumor marker test revealed 130      02/15/2014 Genetic Testing    Genetics testing from 02-2014 normal ([employer] panel)      02/23/2014 Imaging    Interval decrease in ascites. 2. Morphologic changes in the liver consistent with cirrhosis. 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 Portal vein remains patent at this time. 3. Persistent but decreased abnormal soft tissue attenuation tracking in the omental fat. This may be secondary to interval improvement in metastatic disease. 4. Peritoneal thickening seen in the left abdomen and pelvis on the previous study has decreased and nearly resolved in the interval. This also suggests interval improvement in metastatic disease.       03/07/2014 Procedure    Ultrasound and fluoroscopically guided right internal jugular single lumen power port catheter insertion. Tip in the SVC/RA junction. Catheter ready for use.      03/17/2014  Tumor Marker    Patient's tumor was tested for the following markers: CA125 Results of the tumor marker test revealed 45      03/29/2014 Pathology Results    1. Omentum, resection for tumor - HIGH GRADE SEROUS CARCINOMA, SEE COMMENT. 2. Ovary and fallopian tube, right - HIGH GRADE SEROUS CARCINOMA, SEE COMMENT. 3. Ovary and fallopian tube, left - HIGH GRADE SEROUS CARCINOMA, SEE  COMMENT. Diagnosis Note 1. Nests and clusters of malignant cells are invading the omental tissue (part #1) with associated fibrosis. The cells are pleomorphic with prominent nucleoli. There are scattered psammoma bodies. The ovaries are atrophic and exhibit multiple foci of surface based invasive carcinoma and associated fibrosis. The fallopian tubes have a few foci of carcinoma, also superficially located. There are no precursor lesions noted in the ovaries or fallopian tubes (the entire tubes and ovaries were submitted for evaluation). While there is some retraction artifact, there are several foci suspicious for lymphovascular invasion. 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 Given the fibrosis, there does appear to be a small amount of treatment effect, however, there is abundant residual tumor.       03/29/2014 Surgery    Procedure(s) Performed: 1. Exploratory laparotomy with bilateral salpingo-oophorectomy, omentectomy radical tumor debulking for ovarian cancer .  Surgeon: Thereasa Solo, MD.  Assistant Surgeon: [place] Crocker, M.D. Assistant: (an MD assistant was necessary for tissue manipulation, retraction and positioning due to the complexity of the case and hospital policies).  Operative Findings: 10cm omental cake from hepatic to splenic flexure, densely adherent to transverse colon. Milliary studding of tumor implants (<33m, too numerous in number to count) adherent to the mesentery of the small bowel and small bowel wall. Small volume (100cc) ascites. Small ovaries bilaterally, densely adherent to the pelvic cul de sac. Left ovary and tube densely adherent to sigmoid colon. Cirrhotic liver with hepatomegaly and splenomegaly.     This represented an optimal cytoreduction (R1) with visible disease residual on the bowel wall and mesentery (millial, <359m implants).        04/18/2014 Tumor Marker    Patient's tumor was tested for the following markers: CA125 Results of the tumor marker test revealed 122      05/16/2014 Tumor Marker    Patient's tumor was tested for the following markers: CA125 Results of the tumor marker test revealed 30      06/10/2014 Tumor Marker    Patient's tumor was tested for the following markers: CA125 Results of the tumor marker test revealed 19      07/04/2014 Imaging    Interval improvement in the appearance of peritoneal metastasis secondary to ovarian cancer. 2. 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       08/18/2014 Tumor Marker    Patient's tumor was tested for the following markers: CA125 Results of the tumor marker test revealed 16      10/13/2014 Tumor Marker    Patient's tumor was tested for the following markers: CA125 Results of the tumor marker test revealed 14      11/21/2014 Tumor Marker    Patient's tumor was tested for the following markers: CA125 Results of the tumor marker test revealed 16      12/28/2014 Tumor Marker    Patient's tumor was tested for the following markers: CA125 Results of the tumor marker test revealed 762  02/27/2015 Tumor Marker    Patient's tumor was tested for the following markers: CA125 Results of the tumor marker test revealed 20.2      03/22/2015 Imaging    Filler appearance to the prior exam, with very subtle fluid tracking along portions of the liver, and very minimal nodularity along the right paracolic gutter representing residua from the prior peritoneal cancer. The current abnormalities could simply be post therapy findings rather than necessarily representing residual malignancy. No new or enlarging lesions are identified. 2. Hepatic cirrhosis and splenomegaly. There is some gastric varices suggesting portal venous hypertension. 3. Left foraminal stenosis at L4-5 due to spurring. There is likely also some central narrowing  of the thecal sac at this level. 4. Bibasilar scarring. 5. Chronic mild left mid kidney scarring      03/27/2015 Tumor Marker    Patient's tumor was tested for the following markers: CA125 Results of the tumor marker test revealed 25.3      04/24/2015 Imaging    Disc bulge L2-3 with mild spinal stenosis and right foraminal encroachment. 2. Disc bulge L3-4 with mild spinal stenosis. 3. Multifactorial spinal, left lateral recess and foraminal stenosis L4-5. There is grade 1 anterolisthesis without evident dynamic instability.      05/29/2015 Tumor Marker    Patient's tumor was tested for the following markers: CA125 Results of the tumor marker test revealed 29.9      08/21/2015 Tumor Marker    Patient's tumor was tested for the following markers: CA125 Results of the tumor marker test revealed 45      09/18/2015 Tumor Marker    Patient's tumor was tested for the following markers: CA125 Results of the tumor marker test revealed 47      09/27/2015 Imaging    New small bowel mesenteric nodule and enlarged left external iliac lymph node, highly worrisome for metastatic disease. 2. Cirrhosis and splenomegaly      10/10/2015 Tumor Marker    Patient's tumor was tested for the following markers: CA125 Results of the tumor marker test revealed 53.4      10/10/2015 - 12/11/2015 Chemotherapy    She received 4 cycles of carboplatin only      11/20/2015 Tumor Marker    Patient's tumor was tested for the following markers: CA125 Results of the tumor marker test revealed 41.3      12/20/2015 Imaging    CT: Mild left external iliac lymphadenopathy is slightly decreased. Mildly enlarged lower mesenteric nodule is slightly decreased. 2. No new or progressive metastatic disease in the abdomen or pelvis. 3. Cirrhosis. Stable subcentimeter hypodense left liver lobe lesion. No new liver lesions. 4. Stable mild splenomegaly.  No ascites. 5. Mild sigmoid diverticulosis.      01/01/2016 -  Anti-estrogen  oral therapy    She was placed on tamoxifen. Had letrozole initially but switched to tamoxifen due to poor tolerance      01/15/2016 Tumor Marker    Patient's tumor was tested for the following markers: CA125 Results of the tumor marker test revealed 31.4      02/19/2016 Tumor Marker    Patient's tumor was tested for the following markers: CA125 Results of the tumor marker test revealed 36.5      05/13/2016 Imaging    No evidence of disease progression within the abdomen or pelvis. Previously noted small central mesenteric nodule and left external iliac node appear slightly smaller. 2. Stable changes of hepatic cirrhosis and portal hypertension with associated splenomegaly. No new or  enlarging hepatic lesions are identified. 3. No acute findings.      05/13/2016 Tumor Marker    Patient's tumor was tested for the following markers: CA125 Results of the tumor marker test revealed 41.2      06/24/2016 Tumor Marker    Patient's tumor was tested for the following markers: CA125 Results of the tumor marker test revealed 47.3      08/20/2016 Imaging    1. The left external iliac lymph node is slightly increased in size compared to the prior exam, currently 1.1 cm and previously 0.9 cm in diameter. 2. Stable central mesenteric lymph node at 0.9 cm in diameter. 3. Stable trace free pelvic fluid and stable slight thickening along the right paracolic gutter, without well-defined peritoneal nodularity. 4. Other imaging findings of potential clinical significance: Subsegmental atelectasis or scarring in the lung bases. Hepatic cirrhosis. Left renal scarring. Mild splenomegaly. Sigmoid colon diverticulosis. Impingement at L4-5 due to spondylosis and degenerative disc disease broad Schmorl' s nodes at L3-L4. Pelvic floor laxity      08/23/2016 Tumor Marker    Patient's tumor was tested for the following markers: CA125 Results of the tumor marker test revealed 80.2      09/04/2016 Imaging    LV EF: 60%  -   65%      09/12/2016 Tumor Marker    Patient's tumor was tested for the following markers: CA125 Results of the tumor marker test revealed 98.5      09/12/2016 -  Chemotherapy    The patient received Doxil and Avastin.       09/26/2016 Tumor Marker    Patient's tumor was tested for the following markers: CA125 Results of the tumor marker test revealed 68.9      10/10/2016 Tumor Marker    Patient's tumor was tested for the following markers: CA125 Results of the tumor marker test revealed 65.6      11/07/2016 Tumor Marker    Patient's tumor was tested for the following markers: CA125 Results of the tumor marker test revealed 46.4      11/29/2016 Imaging    Stable mild peritoneal thickening, suspicious for peritoneal carcinomatosis. No new or progressive disease identified. No evidence of ascites.  No significant change in 10 mm left external iliac and 8 mm mesenteric lymph nodes.  Stable hepatic cirrhosis, and splenomegaly consistent with portal venous hypertension.  Colonic diverticulosis, without radiographic evidence of diverticulitis.      12/02/2016 Imaging    Normal LV size with EF 55%. Basal inferior and basal inferoseptal hypokinesis. Normal RV size and systolic function. No significant valvular abnormalities.       INTERVAL HISTORY: Please see below for problem oriented charting. She returns for further follow-up to review test results She tolerated last cycle treatment well without any major problems No recent mucositis, nausea vomiting  REVIEW OF SYSTEMS:   Constitutional: Denies fevers, chills or abnormal weight loss Eyes: Denies blurriness of vision Ears, nose, mouth, throat, and face: Denies mucositis or sore throat Respiratory: Denies cough, dyspnea or wheezes Cardiovascular: Denies palpitation, chest discomfort or lower extremity swelling Gastrointestinal:  Denies nausea, heartburn or change in bowel habits Skin: Denies abnormal skin  rashes Lymphatics: Denies new lymphadenopathy or easy bruising Neurological:Denies numbness, tingling or new weaknesses Behavioral/Psych: Mood is stable, no new changes  All other systems were reviewed with the patient and are negative.  I have reviewed the past medical history, past surgical history, social history and family history with the patient and  they are unchanged from previous note.  ALLERGIES:  is allergic to shellfish allergy; benadryl [diphenhydramine hcl]; penicillins; and tape.  MEDICATIONS:  Current Outpatient Prescriptions  Medication Sig Dispense Refill  . amLODipine (NORVASC) 5 MG tablet Take 5 mg by mouth daily.  0  . aspirin EC 81 MG tablet Take 81 mg by mouth daily.    . bisoprolol (ZEBETA) 5 MG tablet   0  . cholecalciferol (VITAMIN D) 1000 units tablet Take 1,000 Units by mouth daily.    Marland Kitchen gabapentin (NEURONTIN) 300 MG capsule Take 2 capsules (600 mg total) by mouth 2 (two) times daily. 90 capsule 3  . HYDROcodone-acetaminophen (NORCO/VICODIN) 5-325 MG tablet take 1 tablet by mouth every 6 hours if needed for MODERATE PAIN 60 tablet 0  . insulin degludec (TRESIBA) 100 UNIT/ML SOPN FlexTouch Pen Inject 13 Units into the skin daily.     Marland Kitchen lidocaine-prilocaine (EMLA) cream Apply to Porta-cath  1-2 hours prior to access as directed. 30 g 1  . metFORMIN (GLUCOPHAGE-XR) 500 MG 24 hr tablet Take 1,000 mg by mouth 2 (two) times daily.   0  . ondansetron (ZOFRAN) 8 MG tablet Take 1 tablet (8 mg total) by mouth every 8 (eight) hours as needed for nausea. 30 tablet 3  . pantoprazole (PROTONIX) 40 MG tablet Take 40 mg by mouth daily.   0  . promethazine (PHENERGAN) 25 MG tablet Take 1 tablet (25 mg total) by mouth every 6 (six) hours as needed for nausea. 30 tablet 3  . rotigotine (NEUPRO) 4 MG/24HR Place 1 patch onto the skin daily. 30 patch 5   No current facility-administered medications for this visit.     PHYSICAL EXAMINATION: ECOG PERFORMANCE STATUS: 1 - Symptomatic  but completely ambulatory  Vitals:   12/03/16 1040  BP: (!) 156/71  Pulse: 77  Resp: 18  Temp: 97.9 F (36.6 C)  SpO2: 100%   Filed Weights   12/03/16 1040  Weight: 193 lb 3.2 oz (87.6 kg)    GENERAL:alert, no distress and comfortable SKIN: skin color, texture, turgor are normal, no rashes or significant lesions EYES: normal, Conjunctiva are pink and non-injected, sclera clear Musculoskeletal:no cyanosis of digits and no clubbing  NEURO: alert & oriented x 3 with fluent speech, no focal motor/sensory deficits  LABORATORY DATA:  I have reviewed the data as listed    Component Value Date/Time   NA 140 11/21/2016 0757   K 4.0 11/21/2016 0757   CL 105 03/30/2014 0618   CO2 20 (L) 11/21/2016 0757   GLUCOSE 253 (H) 11/21/2016 0757   BUN 11.8 11/21/2016 0757   CREATININE 0.9 11/21/2016 0757   CALCIUM 8.2 (L) 11/21/2016 0757   PROT 6.2 (L) 11/21/2016 0757   ALBUMIN 3.3 (L) 11/21/2016 0757   AST 52 (H) 11/21/2016 0757   ALT 53 11/21/2016 0757   ALKPHOS 119 11/21/2016 0757   BILITOT 0.54 11/21/2016 0757   GFRNONAA 86 (L) 03/30/2014 0618   GFRAA >90 03/30/2014 0618    No results found for: SPEP, UPEP  Lab Results  Component Value Date   WBC 3.0 (L) 11/21/2016   NEUTROABS 2.1 11/21/2016   HGB 9.0 (L) 11/21/2016   HCT 28.5 (L) 11/21/2016   MCV 91.9 11/21/2016   PLT 100 (L) 11/21/2016      Chemistry      Component Value Date/Time   NA 140 11/21/2016 0757   K 4.0 11/21/2016 0757   CL 105 03/30/2014 0618   CO2 20 (L) 11/21/2016  0757   BUN 11.8 11/21/2016 0757   CREATININE 0.9 11/21/2016 0757      Component Value Date/Time   CALCIUM 8.2 (L) 11/21/2016 0757   ALKPHOS 119 11/21/2016 0757   AST 52 (H) 11/21/2016 0757   ALT 53 11/21/2016 0757   BILITOT 0.54 11/21/2016 0757       RADIOGRAPHIC STUDIES: I have personally reviewed the radiological images as listed and agreed with the findings in the report. Ct Abdomen Pelvis W Contrast  Result Date:  11/29/2016 CLINICAL DATA:  Followup ovarian carcinoma with peritoneal carcinomatosis. Undergoing chemotherapy. Restaging. EXAM: CT ABDOMEN AND PELVIS WITH CONTRAST TECHNIQUE: Multidetector CT imaging of the abdomen and pelvis was performed using the standard protocol following bolus administration of intravenous contrast. CONTRAST:  145m ISOVUE-300 IOPAMIDOL (ISOVUE-300) INJECTION 61% COMPARISON:  08/20/2016 FINDINGS: Lower Chest: No acute findings.  Stable bibasilar scarring. Hepatobiliary: Hepatic cirrhosis again demonstrated. Stable tiny sub-cm low-attenuation lesions are too small to characterize but most likely represent tiny cysts. No definite masses identified. Gallbladder is unremarkable. Pancreas:  No mass or inflammatory changes. Spleen: Stable mild splenomegaly, consistent with portal venous hypertension. Adrenals/Urinary Tract: Stable left renal parenchymal scarring. No masses identified. No evidence of hydronephrosis. Stomach/Bowel: No evidence of obstruction, inflammatory process or abnormal fluid collections. Sigmoid diverticulosis again demonstrated, without evidence of diverticulitis. Vascular/Lymphatic: Left external iliac lymph node measures 10 mm on image 69/2, compared to 11 mm previously. 8 mm mesenteric lymph node on image 51/2 is also stable . No new or enlarging lymph nodes identified. No abdominal aortic aneurysm. Retroaortic left renal vein again noted. Reproductive: Prior hysterectomy noted. Adnexal regions are unremarkable in appearance. Other: Mild peritoneal thickening again seen in the pelvic cul-de-sac and right abdominal retroperitoneum, which is unchanged and suspicious for peritoneal carcinomatosis. No evidence of ascites. Musculoskeletal:  No suspicious bone lesions identified. IMPRESSION: Stable mild peritoneal thickening, suspicious for peritoneal carcinomatosis. No new or progressive disease identified. No evidence of ascites. No significant change in 10 mm left external  iliac and 8 mm mesenteric lymph nodes. Stable hepatic cirrhosis, and splenomegaly consistent with portal venous hypertension. Colonic diverticulosis, without radiographic evidence of diverticulitis. Electronically Signed   By: JEarle GellM.D.   On: 11/29/2016 11:17    ASSESSMENT & PLAN:  [place] The patient had positive response to treatment CT scan is quite unremarkable and I could not really see any well-defined disease I recommend discontinuation of chemotherapy, especially in view of evidence of early cardiomyopathy on recent echocardiogram She could potentially continue on maintenance Avastin We discussed the risks, benefits, side effects of continuing maintenance therapy but the patient is undecided She will [place] back tomorrow or the next day to make a decision about treatment   P[place], [place] Her recent acquired pancytopenia is due to chemotherapy With discontinuation of Doxil, I suspect it would improve in time  Cardiomyopathy due to chemotherapy ([facility] Echocardiogram show evidence of mild hypokinesis although ejection fraction is well preserved For that reason, I recommend discontinuation of Doxil We will continue risk factor modification and plan to recheck echocardiogram in 3 months  Essential hypertension She has mild hypertension today likely due to anxiety As long as she have no evidence of proteinuria and blood pressure remain well controlled, she can continue on Avastin alone    Goals of care, counseling/discussion We discussed goals of care She understood that treatment is not curable but maintenance treatment may prolong progression free survival   No orders of the defined  types were placed in this encounter.  All questions were answered. The patient knows to call the clinic with any problems, questions or concerns. No barriers to learning was detected. I spent 30 minutes counseling the patient face to face.  The total time spent in the appointment was 40 minutes and more than 50% was on counseling and review of test results     [name], MD 12/03/2016 4:14 PM

## 2016-12-03 NOTE — Assessment & Plan Note (Signed)
She has mild hypertension today likely due to anxiety As long as she have no evidence of proteinuria and blood pressure remain well controlled, she can continue on Avastin alone

## 2016-12-03 NOTE — Assessment & Plan Note (Addendum)
We discussed goals of care She understood that treatment is not curable but maintenance treatment may prolong progression free survival

## 2016-12-03 NOTE — Assessment & Plan Note (Signed)
The patient had positive response to treatment CT scan is quite unremarkable and I could not really see any well-defined disease I recommend discontinuation of chemotherapy, especially in view of evidence of early cardiomyopathy on recent echocardiogram She could potentially continue on maintenance Avastin We discussed the risks, benefits, side effects of continuing maintenance therapy but the patient is undecided She will call us back back tomorrow or the next day to make a decision about treatment

## 2016-12-03 NOTE — Assessment & Plan Note (Signed)
Echocardiogram show evidence of mild hypokinesis although ejection fraction is well preserved For that reason, I recommend discontinuation of Doxil We will continue risk factor modification and plan to recheck echocardiogram in 3 months

## 2016-12-04 ENCOUNTER — Encounter: Payer: Self-pay | Admitting: Neurology

## 2016-12-04 ENCOUNTER — Telehealth: Payer: Self-pay | Admitting: Hematology and Oncology

## 2016-12-04 ENCOUNTER — Ambulatory Visit (INDEPENDENT_AMBULATORY_CARE_PROVIDER_SITE_OTHER): Payer: Medicare Other | Admitting: Neurology

## 2016-12-04 ENCOUNTER — Telehealth: Payer: Self-pay | Admitting: *Deleted

## 2016-12-04 VITALS — BP 138/72 | HR 75 | Ht 61.5 in | Wt 194.0 lb

## 2016-12-04 DIAGNOSIS — G2581 Restless legs syndrome: Secondary | ICD-10-CM

## 2016-12-04 DIAGNOSIS — E611 Iron deficiency: Secondary | ICD-10-CM | POA: Diagnosis not present

## 2016-12-04 NOTE — Telephone Encounter (Signed)
"  I saw Dr. Alvy Bimler yesterday.  Today I need to let her know I decided to go for it and continue with Avastin.  I am scheduled tomorrow and will see you then."    Spoke with patient confirming receipt of voicemail, reviewed appointments beginning with lab at 8:15 am.  [name] says she will be here thirty minutes early."

## 2016-12-04 NOTE — Telephone Encounter (Signed)
Per 10/23 los - Return for No new orders or return visit.

## 2016-12-05 ENCOUNTER — Ambulatory Visit (HOSPITAL_BASED_OUTPATIENT_CLINIC_OR_DEPARTMENT_OTHER): Payer: Medicare Other

## 2016-12-05 ENCOUNTER — Telehealth: Payer: Self-pay

## 2016-12-05 ENCOUNTER — Other Ambulatory Visit (HOSPITAL_BASED_OUTPATIENT_CLINIC_OR_DEPARTMENT_OTHER): Payer: Medicare Other

## 2016-12-05 ENCOUNTER — Ambulatory Visit: Payer: Medicare Other

## 2016-12-05 VITALS — BP 139/69 | HR 72 | Temp 98.0°F | Resp 16

## 2016-12-05 DIAGNOSIS — C482 Malignant neoplasm of peritoneum, unspecified: Secondary | ICD-10-CM

## 2016-12-05 DIAGNOSIS — C569 Malignant neoplasm of unspecified ovary: Secondary | ICD-10-CM

## 2016-12-05 DIAGNOSIS — Z5112 Encounter for antineoplastic immunotherapy: Secondary | ICD-10-CM

## 2016-12-05 DIAGNOSIS — D509 Iron deficiency anemia, unspecified: Secondary | ICD-10-CM

## 2016-12-05 LAB — CBC WITH DIFFERENTIAL/PLATELET
BASO%: 0.2 % (ref 0.0–2.0)
Basophils Absolute: 0 10*3/uL (ref 0.0–0.1)
EOS%: 1 % (ref 0.0–7.0)
Eosinophils Absolute: 0 10*3/uL (ref 0.0–0.5)
HCT: 31.7 % — ABNORMAL LOW (ref 34.8–46.6)
HGB: 9.7 g/dL — ABNORMAL LOW (ref 11.6–15.9)
LYMPH%: 21.5 % (ref 14.0–49.7)
MCH: 27.2 pg (ref 25.1–34.0)
MCHC: 30.6 g/dL — ABNORMAL LOW (ref 31.5–36.0)
MCV: 89 fL (ref 79.5–101.0)
MONO#: 0.3 10*3/uL (ref 0.1–0.9)
MONO%: 6.7 % (ref 0.0–14.0)
NEUT#: 2.9 10*3/uL (ref 1.5–6.5)
NEUT%: 70.6 % (ref 38.4–76.8)
Platelets: 109 10*3/uL — ABNORMAL LOW (ref 145–400)
RBC: 3.56 10*6/uL — ABNORMAL LOW (ref 3.70–5.45)
RDW: 16.7 % — ABNORMAL HIGH (ref 11.2–14.5)
WBC: 4.1 10*3/uL (ref 3.9–10.3)
lymph#: 0.9 10*3/uL (ref 0.9–3.3)

## 2016-12-05 LAB — COMPREHENSIVE METABOLIC PANEL
ALT: 49 U/L (ref 0–55)
AST: 55 U/L — ABNORMAL HIGH (ref 5–34)
Albumin: 3.7 g/dL (ref 3.5–5.0)
Alkaline Phosphatase: 114 U/L (ref 40–150)
Anion Gap: 11 mEq/L (ref 3–11)
BUN: 9.7 mg/dL (ref 7.0–26.0)
CO2: 22 mEq/L (ref 22–29)
Calcium: 7.9 mg/dL — ABNORMAL LOW (ref 8.4–10.4)
Chloride: 110 mEq/L — ABNORMAL HIGH (ref 98–109)
Creatinine: 0.8 mg/dL (ref 0.6–1.1)
EGFR: >60 mL/min/{1.73_m2} (ref >60)
Glucose: 144 mg/dl — ABNORMAL HIGH (ref 70–140)
Potassium: 3.7 mEq/L (ref 3.5–5.1)
Sodium: 143 mEq/L (ref 136–145)
Total Bilirubin: 0.79 mg/dL (ref 0.20–1.20)
Total Protein: 6.7 g/dL (ref 6.4–8.3)

## 2016-12-05 LAB — FERRITIN: Ferritin: 12 ng/ml (ref 9–269)

## 2016-12-05 LAB — UA PROTEIN, DIPSTICK - CHCC: Protein, ur: NEGATIVE mg/dL

## 2016-12-05 LAB — IRON AND TIBC
%SAT: 8 % — ABNORMAL LOW (ref 21–57)
Iron: 40 ug/dL — ABNORMAL LOW (ref 41–142)
TIBC: 486 ug/dL — ABNORMAL HIGH (ref 236–444)
UIBC: 446 ug/dL — ABNORMAL HIGH (ref 120–384)

## 2016-12-05 MED ORDER — SODIUM CHLORIDE 0.9 % IV SOLN
10.3000 mg/kg | Freq: Once | INTRAVENOUS | Status: AC
  Administered 2016-12-05: 900 mg via INTRAVENOUS
  Filled 2016-12-05: qty 32

## 2016-12-05 MED ORDER — SODIUM CHLORIDE 0.9% FLUSH
10.0000 mL | INTRAVENOUS | Status: DC | PRN
  Administered 2016-12-05: 10 mL
  Filled 2016-12-05: qty 10

## 2016-12-05 MED ORDER — HEPARIN SOD (PORK) LOCK FLUSH 100 UNIT/ML IV SOLN
500.0000 [IU] | Freq: Once | INTRAVENOUS | Status: AC | PRN
  Administered 2016-12-05: 500 [IU]
  Filled 2016-12-05: qty 5

## 2016-12-05 MED ORDER — SODIUM CHLORIDE 0.9% FLUSH
3.0000 mL | Freq: Once | INTRAVENOUS | Status: AC | PRN
  Administered 2016-12-05: 3 mL via INTRAVENOUS
  Filled 2016-12-05: qty 10

## 2016-12-05 NOTE — Patient Instructions (Signed)
[facility] Discharge Instructions for Patients Receiving Chemotherapy  Today you received the following chemotherapy agents Avastin  To help prevent nausea and vomiting after your treatment, we encourage you to take your nausea medication as directed   If you develop nausea and vomiting that is not controlled by your nausea medication, call the clinic.   BELOW ARE SYMPTOMS THAT SHOULD BE REPORTED IMMEDIATELY:  *FEVER GREATER THAN 100.5 F  *CHILLS WITH OR WITHOUT FEVER  NAUSEA AND VOMITING THAT IS NOT CONTROLLED WITH YOUR NAUSEA MEDICATION  *UNUSUAL SHORTNESS OF BREATH  *UNUSUAL BRUISING OR BLEEDING  TENDERNESS IN MOUTH AND THROAT WITH OR WITHOUT PRESENCE OF ULCERS  *URINARY PROBLEMS  *BOWEL PROBLEMS  UNUSUAL RASH Items with * indicate a potential emergency and should be followed up as soon as possible.  Feel free to call the clinic should you have any questions or concerns. The clinic phone number is [phone].  Please show the CHEMO ALERT CARD at check-in to the Emergency Department and triage nurse.   

## 2016-12-05 NOTE — Patient Instructions (Signed)

## 2016-12-05 NOTE — Telephone Encounter (Signed)
Sent Gorsuch in basket note with patient concerns.per 10/25n drop patient drop in.

## 2016-12-06 ENCOUNTER — Telehealth: Payer: Self-pay | Admitting: *Deleted

## 2016-12-06 LAB — CA 125: Cancer Antigen (CA) 125: 49.1 U/mL — ABNORMAL HIGH (ref 0.0–38.1)

## 2016-12-06 NOTE — Telephone Encounter (Signed)
Pt left message stating she did not have any future appts scheduled. Would like to know plan so she can arrange work schedule.

## 2016-12-09 ENCOUNTER — Other Ambulatory Visit: Payer: Self-pay | Admitting: Hematology and Oncology

## 2016-12-10 ENCOUNTER — Other Ambulatory Visit: Payer: Self-pay | Admitting: Hematology and Oncology

## 2016-12-10 ENCOUNTER — Telehealth: Payer: Self-pay | Admitting: *Deleted

## 2016-12-10 NOTE — Telephone Encounter (Signed)
Scheduling msg sent

## 2016-12-10 NOTE — Telephone Encounter (Signed)
Pt states she has been more tired lately and is "munching on ice a lot". Wants to know what iron studies showed.

## 2016-12-10 NOTE — Telephone Encounter (Signed)
Would like to get IV iron .

## 2016-12-10 NOTE — Telephone Encounter (Signed)
She just received Avastin recently, next dose should be due on 11/8. Infusion room is full that day, and also the following treatment will fall on Thanksgiving. I am modifying her treatment to start on 11/15 instead. Scheduling msg sent

## 2016-12-10 NOTE — Telephone Encounter (Signed)
Iron studies confirmed iron deficiency We can get her scheduled for IV iron before I see her back or she can go back to oral iron daily at night time until I see her

## 2016-12-13 ENCOUNTER — Telehealth: Payer: Self-pay | Admitting: Hematology and Oncology

## 2016-12-13 NOTE — Telephone Encounter (Signed)
Spoke with patient regarding her appts that were scheduled per 10/30 sch msg. Scheduled her IV at [facility] due to cap days.

## 2016-12-23 ENCOUNTER — Ambulatory Visit (HOSPITAL_COMMUNITY)
Admission: RE | Admit: 2016-12-23 | Discharge: 2016-12-23 | Disposition: A | Discharge status: Home / Self Care | Payer: Medicare Other | Source: Ambulatory Visit | Attending: Hematology and Oncology | Admitting: Hematology and Oncology

## 2016-12-23 ENCOUNTER — Other Ambulatory Visit: Payer: Self-pay | Admitting: *Deleted

## 2016-12-23 DIAGNOSIS — E611 Iron deficiency: Secondary | ICD-10-CM | POA: Insufficient documentation

## 2016-12-23 MED ORDER — SODIUM CHLORIDE 0.9 % IV SOLN
510.0000 mg | Freq: Once | INTRAVENOUS | Status: AC
  Administered 2016-12-23: 510 mg via INTRAVENOUS
  Filled 2016-12-23: qty 17

## 2016-12-23 MED ORDER — SODIUM CHLORIDE 0.9% FLUSH
10.0000 mL | INTRAVENOUS | Status: AC | PRN
  Administered 2016-12-23: 10 mL

## 2016-12-23 MED ORDER — HEPARIN SOD (PORK) LOCK FLUSH 100 UNIT/ML IV SOLN
500.0000 [IU] | INTRAVENOUS | Status: AC | PRN
  Administered 2016-12-23: 500 [IU]
  Filled 2016-12-23: qty 5

## 2016-12-23 NOTE — Progress Notes (Signed)
PATIENT CARE CENTER NOTE  Diagnosis: Iron deficiency    Provider: Alvy Bimler   Procedure: IV Feraheme   Note: Patient received 510 mg of IV Feraheme. Patient tolerated well. Discharge instructions given. Patient alert, oriented and ambulatory at discharge.

## 2016-12-23 NOTE — Discharge Instructions (Signed)

## 2016-12-26 ENCOUNTER — Ambulatory Visit: Payer: Medicare Other

## 2016-12-26 ENCOUNTER — Ambulatory Visit (HOSPITAL_BASED_OUTPATIENT_CLINIC_OR_DEPARTMENT_OTHER): Payer: Medicare Other

## 2016-12-26 ENCOUNTER — Ambulatory Visit (HOSPITAL_BASED_OUTPATIENT_CLINIC_OR_DEPARTMENT_OTHER): Payer: Medicare Other | Admitting: Hematology and Oncology

## 2016-12-26 ENCOUNTER — Telehealth: Payer: Self-pay

## 2016-12-26 ENCOUNTER — Encounter: Payer: Self-pay | Admitting: Hematology and Oncology

## 2016-12-26 ENCOUNTER — Other Ambulatory Visit (HOSPITAL_BASED_OUTPATIENT_CLINIC_OR_DEPARTMENT_OTHER): Payer: Medicare Other

## 2016-12-26 VITALS — BP 138/60 | HR 78

## 2016-12-26 VITALS — BP 129/56 | HR 74 | Temp 98.6°F | Resp 18 | Ht 61.5 in | Wt 192.3 lb

## 2016-12-26 DIAGNOSIS — K7581 Nonalcoholic steatohepatitis (NASH): Secondary | ICD-10-CM

## 2016-12-26 DIAGNOSIS — Z5112 Encounter for antineoplastic immunotherapy: Secondary | ICD-10-CM | POA: Diagnosis present

## 2016-12-26 DIAGNOSIS — D61810 Antineoplastic chemotherapy induced pancytopenia: Secondary | ICD-10-CM | POA: Diagnosis not present

## 2016-12-26 DIAGNOSIS — C482 Malignant neoplasm of peritoneum, unspecified: Secondary | ICD-10-CM

## 2016-12-26 DIAGNOSIS — K7460 Unspecified cirrhosis of liver: Secondary | ICD-10-CM | POA: Diagnosis not present

## 2016-12-26 DIAGNOSIS — D61818 Other pancytopenia: Secondary | ICD-10-CM

## 2016-12-26 DIAGNOSIS — D509 Iron deficiency anemia, unspecified: Secondary | ICD-10-CM | POA: Diagnosis not present

## 2016-12-26 DIAGNOSIS — R5383 Other fatigue: Secondary | ICD-10-CM

## 2016-12-26 DIAGNOSIS — C569 Malignant neoplasm of unspecified ovary: Secondary | ICD-10-CM

## 2016-12-26 LAB — COMPREHENSIVE METABOLIC PANEL
ALT: 58 U/L — ABNORMAL HIGH (ref 0–55)
AST: 62 U/L — ABNORMAL HIGH (ref 5–34)
Albumin: 3.7 g/dL (ref 3.5–5.0)
Alkaline Phosphatase: 116 U/L (ref 40–150)
Anion Gap: 11 mEq/L (ref 3–11)
BUN: 11.5 mg/dL (ref 7.0–26.0)
CO2: 23 mEq/L (ref 22–29)
Calcium: 8.9 mg/dL (ref 8.4–10.4)
Chloride: 109 mEq/L (ref 98–109)
Creatinine: 0.8 mg/dL (ref 0.6–1.1)
EGFR: >60 mL/min/{1.73_m2} (ref >60)
Glucose: 111 mg/dl (ref 70–140)
Potassium: 3.9 mEq/L (ref 3.5–5.1)
Sodium: 142 mEq/L (ref 136–145)
Total Bilirubin: 0.66 mg/dL (ref 0.20–1.20)
Total Protein: 7.1 g/dL (ref 6.4–8.3)

## 2016-12-26 LAB — CBC WITH DIFFERENTIAL/PLATELET
BASO%: 0.4 % (ref 0.0–2.0)
Basophils Absolute: 0 10*3/uL (ref 0.0–0.1)
EOS%: 2 % (ref 0.0–7.0)
Eosinophils Absolute: 0.1 10*3/uL (ref 0.0–0.5)
HCT: 31.2 % — ABNORMAL LOW (ref 34.8–46.6)
HGB: 9.5 g/dL — ABNORMAL LOW (ref 11.6–15.9)
LYMPH%: 21.2 % (ref 14.0–49.7)
MCH: 26.8 pg (ref 25.1–34.0)
MCHC: 30.4 g/dL — ABNORMAL LOW (ref 31.5–36.0)
MCV: 88.1 fL (ref 79.5–101.0)
MONO#: 0.3 10*3/uL (ref 0.1–0.9)
MONO%: 6 % (ref 0.0–14.0)
NEUT#: 3.9 10*3/uL (ref 1.5–6.5)
NEUT%: 70.4 % (ref 38.4–76.8)
Platelets: 119 10*3/uL — ABNORMAL LOW (ref 145–400)
RBC: 3.54 10*6/uL — ABNORMAL LOW (ref 3.70–5.45)
RDW: 17.5 % — ABNORMAL HIGH (ref 11.2–14.5)
WBC: 5.5 10*3/uL (ref 3.9–10.3)
lymph#: 1.2 10*3/uL (ref 0.9–3.3)

## 2016-12-26 MED ORDER — SODIUM CHLORIDE 0.9% FLUSH
10.0000 mL | Freq: Once | INTRAVENOUS | Status: AC
  Administered 2016-12-26: 10 mL
  Filled 2016-12-26: qty 10

## 2016-12-26 MED ORDER — SODIUM CHLORIDE 0.9% FLUSH
10.0000 mL | INTRAVENOUS | Status: DC | PRN
  Administered 2016-12-26: 10 mL
  Filled 2016-12-26: qty 10

## 2016-12-26 MED ORDER — SODIUM CHLORIDE 0.9 % IV SOLN
Freq: Once | INTRAVENOUS | Status: AC
  Administered 2016-12-26: 14:00:00 via INTRAVENOUS

## 2016-12-26 MED ORDER — HEPARIN SOD (PORK) LOCK FLUSH 100 UNIT/ML IV SOLN
500.0000 [IU] | Freq: Once | INTRAVENOUS | Status: AC | PRN
  Administered 2016-12-26: 500 [IU]
  Filled 2016-12-26: qty 5

## 2016-12-26 MED ORDER — SODIUM CHLORIDE 0.9 % IV SOLN
10.3000 mg/kg | Freq: Once | INTRAVENOUS | Status: AC
  Administered 2016-12-26: 900 mg via INTRAVENOUS
  Filled 2016-12-26: qty 32

## 2016-12-26 NOTE — Assessment & Plan Note (Signed)
She has baseline mild liver cirrhosis Her liver enzymes are mildly elevated but stable Continue close monitoring

## 2016-12-26 NOTE — Assessment & Plan Note (Signed)
Her recent acquired pancytopenia is due to chemotherapy With discontinuation of Doxil, I suspect it would improve in time

## 2016-12-26 NOTE — Assessment & Plan Note (Signed)
The patient had positive response to treatment CT scan is quite unremarkable and I could not really see any well-defined disease I recommend discontinuation of chemotherapy, especially in view of evidence of early cardiomyopathy on recent echocardiogram She could potentially continue on maintenance Avastin We discussed the risks, benefits, side effects of continuing maintenance therapy and she agreed to proceed I will continue close blood count and tumor marker monitoring once a month I plan to repeat CT imaging study again in January 2019

## 2016-12-26 NOTE — Telephone Encounter (Signed)
Printed avs and calender for upcoming appointment. Per 11/15 los

## 2016-12-26 NOTE — Assessment & Plan Note (Signed)
She had received IV iron recently I plan to recheck iron studies in 2 weeks

## 2016-12-26 NOTE — Progress Notes (Signed)
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  No worrisome liver mass.  There are associated portal venous collaterals and splenomegaly consistent with portal venous hypertension. 2.  No other significant upper abdominal findings      11/08/2013 Imaging    US showed ascites      11/08/2013 Initial Diagnosis    Patient had ~ 2 months of some urinary incontinence, saw Dr Ronita Hipps in 10-2013, with pelvic mass on exam       11/12/2013 Imaging    There are findings of progressive hepatic cirrhosis with a large amount of ascites and mild splenomegaly. There are venous collaterals present. 2. There is abnormal thickening of the peritoneal surface along the left mid and lower abdominal wall. There is abnormal soft tissue density in the pelvis as well which could reflect the clinically suspected ovarian malignancy but the findings are nondiagnostic. 3. There is a new small left pleural effusion. 4. There is no acute bowel abnormality.      11/18/2013 Imaging    Successful ultrasound guided diagnostic and therapeutic paracentesis yielding 4.1 liters of ascites.       11/18/2013 Pathology Results    PERITONEAL/ASCITIC FLUID (SPECIMEN 1 OF 1 COLLECTED 11/18/13): SEROUS CARCINOMA, PLEASE SEE COMMENT.      12/01/2013 Tumor Marker    Patient's tumor was tested for the following markers: CA125 Results of the tumor marker test revealed 1938      12/03/2013 Imaging    Successful ultrasound-guided therapeutic paracentesis yielding 3.6 liters of peritoneal fluid.      12/07/2013 - 05/30/2014 Chemotherapy    She received 3 cycles of carboplatin and Taxol, interrupted cycle 4 for surgery.  She  subsequently completed 3 more cycles of chemotherapy after surgery      12/20/2013 Imaging    Successful ultrasound-guided diagnostic and therapeutic paracentesis yielding 2.2 liters of peritoneal fluid      12/20/2013 Pathology Results    PERITONEAL/ASCITIC FLUID(SPECIMEN 1 OF 1 COLLECTED 12/20/13): MALIGNANT CELLS CONSISTENT WITH SEROUS CARCINOMA      01/13/2014 Tumor Marker    Patient's tumor was tested for the following markers: CA125 Results of the tumor marker test revealed 227      02/07/2014 Tumor Marker    Patient's tumor was tested for the following markers: CA125 Results of the tumor marker test revealed 130      02/15/2014 Genetic Testing    Genetics testing from 02-2014 normal ([employer] panel)      02/23/2014 Imaging    Interval decrease in ascites. 2. Morphologic changes in the liver consistent with cirrhosis. 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 Portal vein remains patent at this time. 3. Persistent but decreased abnormal soft tissue attenuation tracking in the omental fat. This may be secondary to interval improvement in metastatic disease. 4. Peritoneal thickening seen in the left abdomen and pelvis on the previous study has decreased and nearly resolved in the interval. This also suggests interval improvement in metastatic disease.       03/07/2014 Procedure    Ultrasound and fluoroscopically guided right internal jugular single lumen power port catheter insertion. Tip in the SVC/RA junction. Catheter ready for use.      03/17/2014  Tumor Marker    Patient's tumor was tested for the following markers: CA125 Results of the tumor marker test revealed 45      03/29/2014 Pathology Results    1. Omentum, resection for tumor - HIGH GRADE SEROUS CARCINOMA, SEE COMMENT. 2. Ovary and fallopian tube, right - HIGH GRADE SEROUS CARCINOMA, SEE COMMENT. 3. Ovary and fallopian tube, left - HIGH GRADE SEROUS CARCINOMA, SEE  COMMENT. Diagnosis Note 1. Nests and clusters of malignant cells are invading the omental tissue (part #1) with associated fibrosis. The cells are pleomorphic with prominent nucleoli. There are scattered psammoma bodies. The ovaries are atrophic and exhibit multiple foci of surface based invasive carcinoma and associated fibrosis. The fallopian tubes have a few foci of carcinoma, also superficially located. There are no precursor lesions noted in the ovaries or fallopian tubes (the entire tubes and ovaries were submitted for evaluation). While there is some retraction artifact, there are several foci suspicious for lymphovascular invasion. 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 Given the fibrosis, there does appear to be a small amount of treatment effect, however, there is abundant residual tumor.       03/29/2014 Surgery    Procedure(s) Performed: 1. Exploratory laparotomy with bilateral salpingo-oophorectomy, omentectomy radical tumor debulking for ovarian cancer .  Surgeon: Thereasa Solo, MD.  Assistant Surgeon: Lahoma Crocker, M.D. Assistant: (an MD assistant was necessary for tissue manipulation, retraction and positioning due to the complexity of the case and hospital policies).  Operative Findings: 10cm omental cake from hepatic to splenic flexure, densely adherent to transverse colon. Milliary studding of tumor implants (<30m, too numerous in number to count) adherent to the mesentery of the small bowel and small bowel wall. Small volume (100cc) ascites. Small ovaries bilaterally, densely adherent to the pelvic cul de sac. Left ovary and tube densely adherent to sigmoid colon. Cirrhotic liver with hepatomegaly and splenomegaly.     This represented an optimal cytoreduction (R1) with visible disease residual on the bowel wall and mesentery (millial, <365m implants).        04/18/2014 Tumor Marker    Patient's tumor was tested for the following markers: CA125 Results of the tumor marker test revealed 122      05/16/2014 Tumor Marker    Patient's tumor was tested for the following markers: CA125 Results of the tumor marker test revealed 30      06/10/2014 Tumor Marker    Patient's tumor was tested for the following markers: CA125 Results of the tumor marker test revealed 19      07/04/2014 Imaging    Interval improvement in the appearance of peritoneal metastasis secondary to ovarian cancer. 2. 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       08/18/2014 Tumor Marker    Patient's tumor was tested for the following markers: CA125 Results of the tumor marker test revealed 16      10/13/2014 Tumor Marker    Patient's tumor was tested for the following markers: CA125 Results of the tumor marker test revealed 14      11/21/2014 Tumor Marker    Patient's tumor was tested for the following markers: CA125 Results of the tumor marker test revealed 16      12/28/2014 Tumor Marker    Patient's tumor was tested for the following markers: CA125 Results of the tumor marker test revealed 762  02/27/2015 Tumor Marker    Patient's tumor was tested for the following markers: CA125 Results of the tumor marker test revealed 20.2      03/22/2015 Imaging    Filler appearance to the prior exam, with very subtle fluid tracking along portions of the liver, and very minimal nodularity along the right paracolic gutter representing residua from the prior peritoneal cancer. The current abnormalities could simply be post therapy findings rather than necessarily representing residual malignancy. No new or enlarging lesions are identified. 2. Hepatic cirrhosis and splenomegaly. There is some gastric varices suggesting portal venous hypertension. 3. Left foraminal stenosis at L4-5 due to spurring. There is likely also some central narrowing  of the thecal sac at this level. 4. Bibasilar scarring. 5. Chronic mild left mid kidney scarring      03/27/2015 Tumor Marker    Patient's tumor was tested for the following markers: CA125 Results of the tumor marker test revealed 25.3      04/24/2015 Imaging    Disc bulge L2-3 with mild spinal stenosis and right foraminal encroachment. 2. Disc bulge L3-4 with mild spinal stenosis. 3. Multifactorial spinal, left lateral recess and foraminal stenosis L4-5. There is grade 1 anterolisthesis without evident dynamic instability.      05/29/2015 Tumor Marker    Patient's tumor was tested for the following markers: CA125 Results of the tumor marker test revealed 29.9      08/21/2015 Tumor Marker    Patient's tumor was tested for the following markers: CA125 Results of the tumor marker test revealed 45      09/18/2015 Tumor Marker    Patient's tumor was tested for the following markers: CA125 Results of the tumor marker test revealed 47      09/27/2015 Imaging    New small bowel mesenteric nodule and enlarged left external iliac lymph node, highly worrisome for metastatic disease. 2. Cirrhosis and splenomegaly      10/10/2015 Tumor Marker    Patient's tumor was tested for the following markers: CA125 Results of the tumor marker test revealed 53.4      10/10/2015 - 12/11/2015 Chemotherapy    She received 4 cycles of carboplatin only      11/20/2015 Tumor Marker    Patient's tumor was tested for the following markers: CA125 Results of the tumor marker test revealed 41.3      12/20/2015 Imaging    CT: Mild left external iliac lymphadenopathy is slightly decreased. Mildly enlarged lower mesenteric nodule is slightly decreased. 2. No new or progressive metastatic disease in the abdomen or pelvis. 3. Cirrhosis. Stable subcentimeter hypodense left liver lobe lesion. No new liver lesions. 4. Stable mild splenomegaly.  No ascites. 5. Mild sigmoid diverticulosis.      01/01/2016 -  Anti-estrogen  oral therapy    She was placed on tamoxifen. Had letrozole initially but switched to tamoxifen due to poor tolerance      01/15/2016 Tumor Marker    Patient's tumor was tested for the following markers: CA125 Results of the tumor marker test revealed 31.4      02/19/2016 Tumor Marker    Patient's tumor was tested for the following markers: CA125 Results of the tumor marker test revealed 36.5      05/13/2016 Imaging    No evidence of disease progression within the abdomen or pelvis. Previously noted small central mesenteric nodule and left external iliac node appear slightly smaller. 2. Stable changes of hepatic cirrhosis and portal hypertension with associated splenomegaly. No new or  enlarging hepatic lesions are identified. 3. No acute findings.      05/13/2016 Tumor Marker    Patient's tumor was tested for the following markers: CA125 Results of the tumor marker test revealed 41.2      06/24/2016 Tumor Marker    Patient's tumor was tested for the following markers: CA125 Results of the tumor marker test revealed 47.3      08/20/2016 Imaging    1. The left external iliac lymph node is slightly increased in size compared to the prior exam, currently 1.1 cm and previously 0.9 cm in diameter. 2. Stable central mesenteric lymph node at 0.9 cm in diameter. 3. Stable trace free pelvic fluid and stable slight thickening along the right paracolic gutter, without well-defined peritoneal nodularity. 4. Other imaging findings of potential clinical significance: Subsegmental atelectasis or scarring in the lung bases. Hepatic cirrhosis. Left renal scarring. Mild splenomegaly. Sigmoid colon diverticulosis. Impingement at L4-5 due to spondylosis and degenerative disc disease broad Schmorl' s nodes at L3-L4. Pelvic floor laxity      08/23/2016 Tumor Marker    Patient's tumor was tested for the following markers: CA125 Results of the tumor marker test revealed 80.2      09/04/2016 Imaging    LV EF: 60%  -   65%      09/12/2016 Tumor Marker    Patient's tumor was tested for the following markers: CA125 Results of the tumor marker test revealed 98.5      09/12/2016 -  Chemotherapy    The patient received Doxil and Avastin.       09/26/2016 Tumor Marker    Patient's tumor was tested for the following markers: CA125 Results of the tumor marker test revealed 68.9      10/10/2016 Tumor Marker    Patient's tumor was tested for the following markers: CA125 Results of the tumor marker test revealed 65.6      11/07/2016 Tumor Marker    Patient's tumor was tested for the following markers: CA125 Results of the tumor marker test revealed 46.4      11/29/2016 Imaging    Stable mild peritoneal thickening, suspicious for peritoneal carcinomatosis. No new or progressive disease identified. No evidence of ascites.  No significant change in 10 mm left external iliac and 8 mm mesenteric lymph nodes.  Stable hepatic cirrhosis, and splenomegaly consistent with portal venous hypertension.  Colonic diverticulosis, without radiographic evidence of diverticulitis.      12/02/2016 Imaging    Normal LV size with EF 55%. Basal inferior and basal inferoseptal hypokinesis. Normal RV size and systolic function. No significant valvular abnormalities.      12/05/2016 Tumor Marker    Patient's tumor was tested for the following markers: CA125 Results of the tumor marker test revealed 49.1       INTERVAL HISTORY: Please see below for problem oriented charting. She returns for further follow-up and treatment for primary peritoneal carcinomatosis She continues to complain of fatigue She did not feel much benefit after recent iron infusion Denies recent nausea or vomiting No chest pain, shortness of breath No abdominal swelling or changes in bowel habits  REVIEW OF SYSTEMS:   Constitutional: Denies fevers, chills or abnormal weight loss Eyes: Denies blurriness of vision Ears, nose, mouth, throat, and  face: Denies mucositis or sore throat Respiratory: Denies cough, dyspnea or wheezes Cardiovascular: Denies palpitation, chest discomfort or lower extremity swelling Gastrointestinal:  Denies nausea, heartburn or change in bowel habits Skin: Denies abnormal skin rashes Lymphatics: Denies  new lymphadenopathy or easy bruising Neurological:Denies numbness, tingling or new weaknesses Behavioral/Psych: Mood is stable, no new changes  All other systems were reviewed with the patient and are negative.  I have reviewed the past medical history, past surgical history, social history and family history with the patient and they are unchanged from previous note.  ALLERGIES:  is allergic to shellfish allergy; benadryl [diphenhydramine hcl]; penicillins; and tape.  MEDICATIONS:  Current Outpatient Medications  Medication Sig Dispense Refill  . amLODipine (NORVASC) 5 MG tablet Take 5 mg by mouth daily.  0  . aspirin EC 81 MG tablet Take 81 mg by mouth daily.    . bisoprolol (ZEBETA) 5 MG tablet   0  . cholecalciferol (VITAMIN D) 1000 units tablet Take 1,000 Units by mouth daily.    Marland Kitchen gabapentin (NEURONTIN) 300 MG capsule Take 2 capsules (600 mg total) by mouth 2 (two) times daily. 90 capsule 3  . HYDROcodone-acetaminophen (NORCO/VICODIN) 5-325 MG tablet take 1 tablet by mouth every 6 hours if needed for MODERATE PAIN 60 tablet 0  . insulin degludec (TRESIBA) 100 UNIT/ML SOPN FlexTouch Pen Inject 18 Units into the skin daily.     Marland Kitchen lidocaine-prilocaine (EMLA) cream Apply to Porta-cath  1-2 hours prior to access as directed. 30 g 1  . metFORMIN (GLUCOPHAGE-XR) 500 MG 24 hr tablet Take 1,000 mg by mouth 2 (two) times daily.   0  . ondansetron (ZOFRAN) 8 MG tablet Take 1 tablet (8 mg total) by mouth every 8 (eight) hours as needed for nausea. 30 tablet 3  . pantoprazole (PROTONIX) 40 MG tablet Take 40 mg by mouth daily.   0  . promethazine (PHENERGAN) 25 MG tablet Take 1 tablet (25 mg total) by mouth every  6 (six) hours as needed for nausea. 30 tablet 3  . rotigotine (NEUPRO) 4 MG/24HR Place 1 patch onto the skin daily. 30 patch 5   No current facility-administered medications for this visit.     PHYSICAL EXAMINATION: ECOG PERFORMANCE STATUS: 1 - Symptomatic but completely ambulatory  Vitals:   12/26/16 1237  BP: (!) 129/56  Pulse: 74  Resp: 18  Temp: 98.6 F (37 C)  SpO2: 100%   Filed Weights   12/26/16 1237  Weight: 192 lb 4.8 oz (87.2 kg)    GENERAL:alert, no distress and comfortable SKIN: skin color, texture, turgor are normal, no rashes or significant lesions EYES: normal, Conjunctiva are pink and non-injected, sclera clear OROPHARYNX:no exudate, no erythema and lips, buccal mucosa, and tongue normal  NECK: supple, thyroid normal size, non-tender, without nodularity LYMPH:  no palpable lymphadenopathy in the cervical, axillary or inguinal LUNGS: clear to auscultation and percussion with normal breathing effort HEART: regular rate & rhythm and no murmurs and no lower extremity edema ABDOMEN:abdomen soft, non-tender and normal bowel sounds Musculoskeletal:no cyanosis of digits and no clubbing  NEURO: alert & oriented x 3 with fluent speech, no focal motor/sensory deficits  LABORATORY DATA:  I have reviewed the data as listed    Component Value Date/Time   NA 142 12/26/2016 1133   K 3.9 12/26/2016 1133   CL 105 03/30/2014 0618   CO2 23 12/26/2016 1133   GLUCOSE 111 12/26/2016 1133   BUN 11.5 12/26/2016 1133   CREATININE 0.8 12/26/2016 1133   CALCIUM 8.9 12/26/2016 1133   PROT 7.1 12/26/2016 1133   ALBUMIN 3.7 12/26/2016 1133   AST 62 (H) 12/26/2016 1133   ALT 58 (H) 12/26/2016 1133   ALKPHOS 116 12/26/2016 1133  BILITOT 0.66 12/26/2016 1133   GFRNONAA 86 (L) 03/30/2014 0618   GFRAA >90 03/30/2014 0618    No results found for: SPEP, UPEP  Lab Results  Component Value Date   WBC 5.5 12/26/2016   NEUTROABS 3.9 12/26/2016   HGB 9.5 (L) 12/26/2016   HCT  31.2 (L) 12/26/2016   MCV 88.1 12/26/2016   PLT 119 (L) 12/26/2016      Chemistry      Component Value Date/Time   NA 142 12/26/2016 1133   K 3.9 12/26/2016 1133   CL 105 03/30/2014 0618   CO2 23 12/26/2016 1133   BUN 11.5 12/26/2016 1133   CREATININE 0.8 12/26/2016 1133      Component Value Date/Time   CALCIUM 8.9 12/26/2016 1133   ALKPHOS 116 12/26/2016 1133   AST 62 (H) 12/26/2016 1133   ALT 58 (H) 12/26/2016 1133   BILITOT 0.66 12/26/2016 1133       RADIOGRAPHIC STUDIES: I have personally reviewed the radiological images as listed and agreed with the findings in the report. Ct Abdomen Pelvis W Contrast  Result Date: 11/29/2016 CLINICAL DATA:  Followup ovarian carcinoma with peritoneal carcinomatosis. Undergoing chemotherapy. Restaging. EXAM: CT ABDOMEN AND PELVIS WITH CONTRAST TECHNIQUE: Multidetector CT imaging of the abdomen and pelvis was performed using the standard protocol following bolus administration of intravenous contrast. CONTRAST:  176m ISOVUE-300 IOPAMIDOL (ISOVUE-300) INJECTION 61% COMPARISON:  08/20/2016 FINDINGS: Lower Chest: No acute findings.  Stable bibasilar scarring. Hepatobiliary: Hepatic cirrhosis again demonstrated. Stable tiny sub-cm low-attenuation lesions are too small to characterize but most likely represent tiny cysts. No definite masses identified. Gallbladder is unremarkable. Pancreas:  No mass or inflammatory changes. Spleen: Stable mild splenomegaly, consistent with portal venous hypertension. Adrenals/Urinary Tract: Stable left renal parenchymal scarring. No masses identified. No evidence of hydronephrosis. Stomach/Bowel: No evidence of obstruction, inflammatory process or abnormal fluid collections. Sigmoid diverticulosis again demonstrated, without evidence of diverticulitis. Vascular/Lymphatic: Left external iliac lymph node measures 10 mm on image 69/2, compared to 11 mm previously. 8 mm mesenteric lymph node on image 51/2 is also stable . No  new or enlarging lymph nodes identified. No abdominal aortic aneurysm. Retroaortic left renal vein again noted. Reproductive: Prior hysterectomy noted. Adnexal regions are unremarkable in appearance. Other: Mild peritoneal thickening again seen in the pelvic cul-de-sac and right abdominal retroperitoneum, which is unchanged and suspicious for peritoneal carcinomatosis. No evidence of ascites. Musculoskeletal:  No suspicious bone lesions identified. IMPRESSION: Stable mild peritoneal thickening, suspicious for peritoneal carcinomatosis. No new or progressive disease identified. No evidence of ascites. No significant change in 10 mm left external iliac and 8 mm mesenteric lymph nodes. Stable hepatic cirrhosis, and splenomegaly consistent with portal venous hypertension. Colonic diverticulosis, without radiographic evidence of diverticulitis. Electronically Signed   By: JEarle GellM.D.   On: 11/29/2016 11:17    ASSESSMENT & PLAN:  [place] carcinomatosis ([place] The patient had positive response to treatment CT scan is quite unremarkable and I could not really see any well-defined disease I recommend discontinuation of chemotherapy, especially in view of evidence of early cardiomyopathy on recent echocardiogram She could potentially continue on maintenance Avastin We discussed the risks, benefits, side effects of continuing maintenance therapy and she agreed to proceed I will continue close blood count and tumor marker monitoring once a month I plan to repeat CT imaging study again in January 2019  Pancytopenia, acquired ([facility] Her recent acquired pancytopenia is due to chemotherapy With discontinuation of Doxil, I suspect it would improve  in time  Iron deficiency anemia She had received IV iron recently I plan to recheck iron studies in 2 weeks  Liver cirrhosis secondary to NASH [facility]) She has baseline mild liver cirrhosis Her liver enzymes are mildly elevated but stable Continue close  monitoring   Orders Placed This Encounter  Procedures  . Iron and TIBC    Standing Status:   Future    Standing Expiration Date:   01/30/2018  . Ferritin    Standing Status:   Future    Standing Expiration Date:   01/30/2018   All questions were answered. The patient knows to call the clinic with any problems, questions or concerns. No barriers to learning was detected. I spent 15 minutes counseling the patient face to face. The total time spent in the appointment was 20 minutes and more than 50% was on counseling and review of test results     [name], MD 12/26/2016 1:14 PM

## 2016-12-26 NOTE — Patient Instructions (Addendum)
[place] Discharge Instructions for Patients Receiving Chemotherapy  Today you received the following chemotherapy agents:  bevacizumab (Avastin).  To help prevent nausea and vomiting after your treatment, we encourage you to take your nausea medication as directed.   If you develop nausea and vomiting that is not controlled by your nausea medication, call the clinic.   BELOW ARE SYMPTOMS THAT SHOULD BE REPORTED IMMEDIATELY:  *FEVER GREATER THAN 100.5 F  *CHILLS WITH OR WITHOUT FEVER  NAUSEA AND VOMITING THAT IS NOT CONTROLLED WITH YOUR NAUSEA MEDICATION  *UNUSUAL SHORTNESS OF BREATH  *UNUSUAL BRUISING OR BLEEDING  TENDERNESS IN MOUTH AND THROAT WITH OR WITHOUT PRESENCE OF ULCERS  *URINARY PROBLEMS  *BOWEL PROBLEMS  UNUSUAL RASH Items with * indicate a potential emergency and should be followed up as soon as possible.  Feel free to call the clinic should you have any questions or concerns. The clinic phone number is [phone].  Please show the [place] at check-in to the Emergency Department and triage nurse.

## 2017-01-09 ENCOUNTER — Other Ambulatory Visit: Payer: Self-pay

## 2017-01-09 ENCOUNTER — Ambulatory Visit: Payer: Medicare Other

## 2017-01-09 ENCOUNTER — Ambulatory Visit (HOSPITAL_BASED_OUTPATIENT_CLINIC_OR_DEPARTMENT_OTHER): Payer: Medicare Other

## 2017-01-09 ENCOUNTER — Telehealth: Payer: Self-pay

## 2017-01-09 ENCOUNTER — Other Ambulatory Visit (HOSPITAL_BASED_OUTPATIENT_CLINIC_OR_DEPARTMENT_OTHER): Payer: Medicare Other

## 2017-01-09 VITALS — BP 143/65 | HR 69 | Temp 98.3°F | Resp 18 | Wt 193.0 lb

## 2017-01-09 DIAGNOSIS — Z5112 Encounter for antineoplastic immunotherapy: Secondary | ICD-10-CM | POA: Diagnosis present

## 2017-01-09 DIAGNOSIS — C482 Malignant neoplasm of peritoneum, unspecified: Secondary | ICD-10-CM

## 2017-01-09 DIAGNOSIS — C569 Malignant neoplasm of unspecified ovary: Secondary | ICD-10-CM

## 2017-01-09 DIAGNOSIS — D509 Iron deficiency anemia, unspecified: Secondary | ICD-10-CM | POA: Diagnosis not present

## 2017-01-09 LAB — COMPREHENSIVE METABOLIC PANEL
ALT: 57 U/L — ABNORMAL HIGH (ref 0–55)
AST: 62 U/L — ABNORMAL HIGH (ref 5–34)
Albumin: 3.6 g/dL (ref 3.5–5.0)
Alkaline Phosphatase: 115 U/L (ref 40–150)
Anion Gap: 12 mEq/L — ABNORMAL HIGH (ref 3–11)
BUN: 12.4 mg/dL (ref 7.0–26.0)
CO2: 21 mEq/L — ABNORMAL LOW (ref 22–29)
Calcium: 8.9 mg/dL (ref 8.4–10.4)
Chloride: 109 mEq/L (ref 98–109)
Creatinine: 0.8 mg/dL (ref 0.6–1.1)
EGFR: >60 mL/min/{1.73_m2} (ref >60)
Glucose: 103 mg/dl (ref 70–140)
Potassium: 3.9 mEq/L (ref 3.5–5.1)
Sodium: 141 mEq/L (ref 136–145)
Total Bilirubin: 0.73 mg/dL (ref 0.20–1.20)
Total Protein: 6.9 g/dL (ref 6.4–8.3)

## 2017-01-09 LAB — CBC WITH DIFFERENTIAL/PLATELET
BASO%: 1 % (ref 0.0–2.0)
Basophils Absolute: 0 10*3/uL (ref 0.0–0.1)
EOS%: 2.5 % (ref 0.0–7.0)
Eosinophils Absolute: 0.1 10*3/uL (ref 0.0–0.5)
HCT: 32.7 % — ABNORMAL LOW (ref 34.8–46.6)
HGB: 10.2 g/dL — ABNORMAL LOW (ref 11.6–15.9)
LYMPH%: 24.3 % (ref 14.0–49.7)
MCH: 27.4 pg (ref 25.1–34.0)
MCHC: 31.2 g/dL — ABNORMAL LOW (ref 31.5–36.0)
MCV: 87.9 fL (ref 79.5–101.0)
MONO#: 0.2 10*3/uL (ref 0.1–0.9)
MONO%: 4.9 % (ref 0.0–14.0)
NEUT#: 2.7 10*3/uL (ref 1.5–6.5)
NEUT%: 67.3 % (ref 38.4–76.8)
Platelets: 109 10*3/uL — ABNORMAL LOW (ref 145–400)
RBC: 3.72 10*6/uL (ref 3.70–5.45)
RDW: 23.2 % — ABNORMAL HIGH (ref 11.2–14.5)
WBC: 4 10*3/uL (ref 3.9–10.3)
lymph#: 1 10*3/uL (ref 0.9–3.3)

## 2017-01-09 LAB — IRON AND TIBC
%SAT: 11 % — ABNORMAL LOW (ref 21–57)
Iron: 45 ug/dL (ref 41–142)
TIBC: 415 ug/dL (ref 236–444)
UIBC: 370 ug/dL (ref 120–384)

## 2017-01-09 LAB — FERRITIN: Ferritin: 94 ng/ml (ref 9–269)

## 2017-01-09 LAB — UA PROTEIN, DIPSTICK - CHCC: Protein, ur: NEGATIVE mg/dL

## 2017-01-09 MED ORDER — SODIUM CHLORIDE 0.9% FLUSH
10.0000 mL | Freq: Once | INTRAVENOUS | Status: AC
  Administered 2017-01-09: 10 mL
  Filled 2017-01-09: qty 10

## 2017-01-09 MED ORDER — DEXTROSE 5 % IV SOLN
Freq: Once | INTRAVENOUS | Status: DC
  Administered 2017-01-09: 14:00:00 via INTRAVENOUS

## 2017-01-09 MED ORDER — SODIUM CHLORIDE 0.9% FLUSH
10.0000 mL | INTRAVENOUS | Status: DC | PRN
  Administered 2017-01-09: 10 mL
  Filled 2017-01-09: qty 10

## 2017-01-09 MED ORDER — HYDROCODONE-ACETAMINOPHEN 5-325 MG PO TABS: ORAL_TABLET | ORAL | 0 refills | Status: DC

## 2017-01-09 MED ORDER — SODIUM CHLORIDE 0.9 % IV SOLN
Freq: Once | INTRAVENOUS | Status: AC
  Administered 2017-01-09: 14:00:00 via INTRAVENOUS

## 2017-01-09 MED ORDER — SODIUM CHLORIDE 0.9 % IV SOLN
10.2000 mg/kg | Freq: Once | INTRAVENOUS | Status: AC
  Administered 2017-01-09: 900 mg via INTRAVENOUS
  Filled 2017-01-09: qty 32

## 2017-01-09 MED ORDER — HEPARIN SOD (PORK) LOCK FLUSH 100 UNIT/ML IV SOLN
500.0000 [IU] | Freq: Once | INTRAVENOUS | Status: AC | PRN
  Administered 2017-01-09: 500 [IU]
  Filled 2017-01-09: qty 5

## 2017-01-09 NOTE — Patient Instructions (Signed)
Bevacizumab injection What is this medicine? BEVACIZUMAB (be va SIZ yoo mab) is a monoclonal antibody. It is used to treat many types of cancer. This medicine may be used for other purposes; ask your health care provider or pharmacist if you have questions. COMMON BRAND NAME(S): Avastin What should I tell my health care provider before I take this medicine? They need to know if you have any of these conditions: -diabetes -heart disease -high blood pressure -history of coughing up blood -prior anthracycline chemotherapy (e.g., doxorubicin, daunorubicin, epirubicin) -recent or ongoing radiation therapy -recent or planning to have surgery -stroke -an unusual or allergic reaction to bevacizumab, hamster proteins, mouse proteins, other medicines, foods, dyes, or preservatives -pregnant or trying to get pregnant -breast-feeding How should I use this medicine? This medicine is for infusion into a vein. It is given by a health care professional in a hospital or clinic setting. Talk to your pediatrician regarding the use of this medicine in children. Special care may be needed. Overdosage: If you think you have taken too much of this medicine contact a poison control center or emergency room at once. NOTE: This medicine is only for you. Do not share this medicine with others. What if I miss a dose? It is important not to miss your dose. Call your doctor or health care professional if you are unable to keep an appointment. What may interact with this medicine? Interactions are not expected. This list may not describe all possible interactions. Give your health care provider a list of all the medicines, herbs, non-prescription drugs, or dietary supplements you use. Also tell them if you smoke, drink alcohol, or use illegal drugs. Some items may interact with your medicine. What should I watch for while using this medicine? Your condition will be monitored carefully while you are receiving this  medicine. You will need important blood work and urine testing done while you are taking this medicine. This medicine may increase your risk to bruise or bleed. Call your doctor or health care professional if you notice any unusual bleeding. This medicine should be started at least 28 days following major surgery and the site of the surgery should be totally healed. Check with your doctor before scheduling dental work or surgery while you are receiving this treatment. Talk to your doctor if you have recently had surgery or if you have a wound that has not healed. Do not become pregnant while taking this medicine or for 6 months after stopping it. Women should inform their doctor if they wish to become pregnant or think they might be pregnant. There is a potential for serious side effects to an unborn child. Talk to your health care professional or pharmacist for more information. Do not breast-feed an infant while taking this medicine and for 6 months after the last dose. This medicine has caused ovarian failure in some women. This medicine may interfere with the ability to have a child. You should talk to your doctor or health care professional if you are concerned about your fertility. What side effects may I notice from receiving this medicine? Side effects that you should report to your doctor or health care professional as soon as possible: -allergic reactions like skin rash, itching or hives, swelling of the face, lips, or tongue -chest pain or chest tightness -chills -coughing up blood -high fever -seizures -severe constipation -signs and symptoms of bleeding such as bloody or black, tarry stools; red or dark-brown urine; spitting up blood or brown material that looks  like coffee grounds; red spots on the skin; unusual bruising or bleeding from the eye, gums, or nose -signs and symptoms of a blood clot such as breathing problems; chest pain; severe, sudden headache; pain, swelling, warmth in  the leg -signs and symptoms of a stroke like changes in vision; confusion; trouble speaking or understanding; severe headaches; sudden numbness or weakness of the face, arm or leg; trouble walking; dizziness; loss of balance or coordination -stomach pain -sweating -swelling of legs or ankles -vomiting -weight gain Side effects that usually do not require medical attention (report to your doctor or health care professional if they continue or are bothersome): -back pain -changes in taste -decreased appetite -dry skin -nausea -tiredness This list may not describe all possible side effects. Call your doctor for medical advice about side effects. You may report side effects to FDA at 1-800-FDA-1088. Where should I keep my medicine? This drug is given in a hospital or clinic and will not be stored at home. NOTE: This sheet is a summary. It may not cover all possible information. If you have questions about this medicine, talk to your doctor, pharmacist, or health care provider.  2018 [employer] Standard (2016-01-26 14:33:29)

## 2017-01-09 NOTE — Telephone Encounter (Signed)
Called with below message. 

## 2017-01-09 NOTE — Telephone Encounter (Signed)
-----   Message from [name], MD sent at 01/09/2017  3:14 PM EST ----- Regarding: iron study is better Pls let her know ----- Message ----- From: Interface, [facility] Sent: 01/09/2017  12:09 PM To: [name], MD

## 2017-01-10 LAB — CA 125: Cancer Antigen (CA) 125: 47.2 U/mL — ABNORMAL HIGH (ref 0.0–38.1)

## 2017-01-20 ENCOUNTER — Institutional Professional Consult (permissible substitution): Payer: Medicare Other | Admitting: Internal Medicine

## 2017-01-23 ENCOUNTER — Ambulatory Visit: Payer: Medicare Other

## 2017-01-23 ENCOUNTER — Ambulatory Visit (HOSPITAL_BASED_OUTPATIENT_CLINIC_OR_DEPARTMENT_OTHER): Payer: Medicare Other | Admitting: Hematology and Oncology

## 2017-01-23 ENCOUNTER — Other Ambulatory Visit (HOSPITAL_BASED_OUTPATIENT_CLINIC_OR_DEPARTMENT_OTHER): Payer: Medicare Other

## 2017-01-23 ENCOUNTER — Ambulatory Visit (HOSPITAL_BASED_OUTPATIENT_CLINIC_OR_DEPARTMENT_OTHER): Payer: Medicare Other

## 2017-01-23 VITALS — BP 146/71 | HR 65 | Resp 17

## 2017-01-23 VITALS — BP 143/62 | HR 71 | Temp 98.7°F | Resp 18 | Ht 61.5 in | Wt 192.4 lb

## 2017-01-23 DIAGNOSIS — D509 Iron deficiency anemia, unspecified: Secondary | ICD-10-CM

## 2017-01-23 DIAGNOSIS — C482 Malignant neoplasm of peritoneum, unspecified: Secondary | ICD-10-CM

## 2017-01-23 DIAGNOSIS — D696 Thrombocytopenia, unspecified: Secondary | ICD-10-CM | POA: Diagnosis not present

## 2017-01-23 DIAGNOSIS — K7460 Unspecified cirrhosis of liver: Secondary | ICD-10-CM

## 2017-01-23 DIAGNOSIS — Z5112 Encounter for antineoplastic immunotherapy: Secondary | ICD-10-CM

## 2017-01-23 DIAGNOSIS — I427 Cardiomyopathy due to drug and external agent: Secondary | ICD-10-CM

## 2017-01-23 DIAGNOSIS — R0981 Nasal congestion: Secondary | ICD-10-CM

## 2017-01-23 DIAGNOSIS — C569 Malignant neoplasm of unspecified ovary: Secondary | ICD-10-CM

## 2017-01-23 DIAGNOSIS — T451X5A Adverse effect of antineoplastic and immunosuppressive drugs, initial encounter: Secondary | ICD-10-CM

## 2017-01-23 DIAGNOSIS — K7581 Nonalcoholic steatohepatitis (NASH): Secondary | ICD-10-CM

## 2017-01-23 LAB — COMPREHENSIVE METABOLIC PANEL
ALT: 53 U/L (ref 0–55)
AST: 53 U/L — ABNORMAL HIGH (ref 5–34)
Albumin: 3.8 g/dL (ref 3.5–5.0)
Alkaline Phosphatase: 115 U/L (ref 40–150)
Anion Gap: 11 mEq/L (ref 3–11)
BUN: 12.4 mg/dL (ref 7.0–26.0)
CO2: 23 mEq/L (ref 22–29)
Calcium: 8.6 mg/dL (ref 8.4–10.4)
Chloride: 107 mEq/L (ref 98–109)
Creatinine: 0.8 mg/dL (ref 0.6–1.1)
EGFR: >60 mL/min/{1.73_m2} (ref >60)
Glucose: 147 mg/dl — ABNORMAL HIGH (ref 70–140)
Potassium: 3.9 mEq/L (ref 3.5–5.1)
Sodium: 142 mEq/L (ref 136–145)
Total Bilirubin: 0.82 mg/dL (ref 0.20–1.20)
Total Protein: 7.2 g/dL (ref 6.4–8.3)

## 2017-01-23 LAB — CBC WITH DIFFERENTIAL/PLATELET
BASO%: 0.2 % (ref 0.0–2.0)
Basophils Absolute: 0 10*3/uL (ref 0.0–0.1)
EOS%: 2.2 % (ref 0.0–7.0)
Eosinophils Absolute: 0.1 10*3/uL (ref 0.0–0.5)
HCT: 34.8 % (ref 34.8–46.6)
HGB: 10.6 g/dL — ABNORMAL LOW (ref 11.6–15.9)
LYMPH%: 21.4 % (ref 14.0–49.7)
MCH: 26.9 pg (ref 25.1–34.0)
MCHC: 30.5 g/dL — ABNORMAL LOW (ref 31.5–36.0)
MCV: 88.3 fL (ref 79.5–101.0)
MONO#: 0.3 10*3/uL (ref 0.1–0.9)
MONO%: 6.5 % (ref 0.0–14.0)
NEUT#: 3.1 10*3/uL (ref 1.5–6.5)
NEUT%: 69.7 % (ref 38.4–76.8)
Platelets: 88 10*3/uL — ABNORMAL LOW (ref 145–400)
RBC: 3.94 10*6/uL (ref 3.70–5.45)
RDW: 18.7 % — ABNORMAL HIGH (ref 11.2–14.5)
WBC: 4.5 10*3/uL (ref 3.9–10.3)
lymph#: 1 10*3/uL (ref 0.9–3.3)

## 2017-01-23 MED ORDER — SODIUM CHLORIDE 0.9% FLUSH
10.0000 mL | INTRAVENOUS | Status: DC | PRN
  Administered 2017-01-23: 10 mL
  Filled 2017-01-23: qty 10

## 2017-01-23 MED ORDER — SODIUM CHLORIDE 0.9% FLUSH
10.0000 mL | Freq: Once | INTRAVENOUS | Status: AC
  Administered 2017-01-23: 10 mL
  Filled 2017-01-23: qty 10

## 2017-01-23 MED ORDER — GABAPENTIN 300 MG PO CAPS: 600.0000 mg | ORAL_CAPSULE | Freq: Three times a day (TID) | ORAL | 3 refills | Status: DC

## 2017-01-23 MED ORDER — HEPARIN SOD (PORK) LOCK FLUSH 100 UNIT/ML IV SOLN
500.0000 [IU] | Freq: Once | INTRAVENOUS | Status: AC | PRN
  Administered 2017-01-23: 500 [IU]
  Filled 2017-01-23: qty 5

## 2017-01-23 MED ORDER — SODIUM CHLORIDE 0.9 % IV SOLN
10.2000 mg/kg | Freq: Once | INTRAVENOUS | Status: AC
  Administered 2017-01-23: 900 mg via INTRAVENOUS
  Filled 2017-01-23: qty 36

## 2017-01-23 MED ORDER — SODIUM CHLORIDE 0.9 % IV SOLN
Freq: Once | INTRAVENOUS | Status: AC
  Administered 2017-01-23: 11:00:00 via INTRAVENOUS

## 2017-01-23 NOTE — Patient Instructions (Signed)
[facility] Discharge Instructions for Patients Receiving Chemotherapy  Today you received the following chemotherapy agents Avastin  To help prevent nausea and vomiting after your treatment, we encourage you to take your nausea medication as directed   If you develop nausea and vomiting that is not controlled by your nausea medication, call the clinic.   BELOW ARE SYMPTOMS THAT SHOULD BE REPORTED IMMEDIATELY:  *FEVER GREATER THAN 100.5 F  *CHILLS WITH OR WITHOUT FEVER  NAUSEA AND VOMITING THAT IS NOT CONTROLLED WITH YOUR NAUSEA MEDICATION  *UNUSUAL SHORTNESS OF BREATH  *UNUSUAL BRUISING OR BLEEDING  TENDERNESS IN MOUTH AND THROAT WITH OR WITHOUT PRESENCE OF ULCERS  *URINARY PROBLEMS  *BOWEL PROBLEMS  UNUSUAL RASH Items with * indicate a potential emergency and should be followed up as soon as possible.  Feel free to call the clinic should you have any questions or concerns. The clinic phone number is [phone].  Please show the CHEMO ALERT CARD at check-in to the Emergency Department and triage nurse.   

## 2017-01-24 ENCOUNTER — Encounter: Payer: Self-pay | Admitting: Hematology and Oncology

## 2017-01-24 DIAGNOSIS — R0981 Nasal congestion: Secondary | ICD-10-CM | POA: Insufficient documentation

## 2017-01-24 DIAGNOSIS — I429 Cardiomyopathy, unspecified: Secondary | ICD-10-CM | POA: Insufficient documentation

## 2017-01-24 NOTE — Assessment & Plan Note (Signed)
The patient had recent positive response to treatment CT scan is quite unremarkable and I could not really see any well-defined disease She could potentially continue on maintenance Avastin We discussed the risks, benefits, side effects of continuing maintenance therapy and she agreed to proceed I will continue close blood count and tumor marker monitoring once a month I plan to repeat CT imaging study again in January 2019

## 2017-01-24 NOTE — Assessment & Plan Note (Signed)
Her last echocardiogram is abnormal She will continue risk factor management Clinically, she denies chest pain, shortness of breath or signs and symptoms or heart failure I plan to repeat echocardiogram next month

## 2017-01-24 NOTE — Assessment & Plan Note (Signed)
She had responded well to intravenous iron recently I plan to repeat iron study in the next visit

## 2017-01-24 NOTE — Assessment & Plan Note (Signed)
She has baseline mild liver cirrhosis Her liver enzymes are mildly elevated but stable Continue close monitoring

## 2017-01-24 NOTE — Assessment & Plan Note (Signed)
This is likely due to recent treatment. The patient denies recent history of bleeding such as epistaxis, hematuria or hematochezia. She is asymptomatic from the low platelet count. I will observe for now.

## 2017-01-24 NOTE — Assessment & Plan Note (Signed)
She had recent sinus congestion Clinically, it does not appear to be serious and I do not feel strongly she needs antibiotic therapy I recommend conservative management with over-the-counter decongestion only

## 2017-01-24 NOTE — Progress Notes (Signed)
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  No worrisome liver mass.  There are associated portal venous collaterals and splenomegaly consistent with portal venous hypertension. 2.  No other significant upper abdominal findings      11/08/2013 Imaging    US showed ascites      11/08/2013 Initial Diagnosis    Patient had ~ 2 months of some urinary incontinence, saw Dr Ronita Hipps in 10-2013, with pelvic mass on exam       11/12/2013 Imaging    There are findings of progressive hepatic cirrhosis with a large amount of ascites and mild splenomegaly. There are venous collaterals present. 2. There is abnormal thickening of the peritoneal surface along the left mid and lower abdominal wall. There is abnormal soft tissue density in the pelvis as well which could reflect the clinically suspected ovarian malignancy but the findings are nondiagnostic. 3. There is a new small left pleural effusion. 4. There is no acute bowel abnormality.      11/18/2013 Imaging    Successful ultrasound guided diagnostic and therapeutic paracentesis yielding 4.1 liters of ascites.       11/18/2013 Pathology Results    PERITONEAL/ASCITIC FLUID (SPECIMEN 1 OF 1 COLLECTED 11/18/13): SEROUS CARCINOMA, PLEASE SEE COMMENT.      12/01/2013 Tumor Marker    Patient's tumor was tested for the following markers: CA125 Results of the tumor marker test revealed 1938      12/03/2013 Imaging    Successful ultrasound-guided therapeutic paracentesis yielding 3.6 liters of peritoneal fluid.      12/07/2013 - 05/30/2014 Chemotherapy    She received 3 cycles of carboplatin and Taxol, interrupted cycle 4 for surgery.  She  subsequently completed 3 more cycles of chemotherapy after surgery      12/20/2013 Imaging    Successful ultrasound-guided diagnostic and therapeutic paracentesis yielding 2.2 liters of peritoneal fluid      12/20/2013 Pathology Results    PERITONEAL/ASCITIC FLUID(SPECIMEN 1 OF 1 COLLECTED 12/20/13): MALIGNANT CELLS CONSISTENT WITH SEROUS CARCINOMA      01/13/2014 Tumor Marker    Patient's tumor was tested for the following markers: CA125 Results of the tumor marker test revealed 227      02/07/2014 Tumor Marker    Patient's tumor was tested for the following markers: CA125 Results of the tumor marker test revealed 130      02/15/2014 Genetic Testing    Genetics testing from 02-2014 normal ([employer] panel)      02/23/2014 Imaging    Interval decrease in ascites. 2. Morphologic changes in the liver consistent with cirrhosis. 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 Portal vein remains patent at this time. 3. Persistent but decreased abnormal soft tissue attenuation tracking in the omental fat. This may be secondary to interval improvement in metastatic disease. 4. Peritoneal thickening seen in the left abdomen and pelvis on the previous study has decreased and nearly resolved in the interval. This also suggests interval improvement in metastatic disease.       03/07/2014 Procedure    Ultrasound and fluoroscopically guided right internal jugular single lumen power port catheter insertion. Tip in the SVC/RA junction. Catheter ready for use.      03/17/2014  Tumor Marker    Patient's tumor was tested for the following markers: CA125 Results of the tumor marker test revealed 45      03/29/2014 Pathology Results    1. Omentum, resection for tumor - HIGH GRADE SEROUS CARCINOMA, SEE COMMENT. 2. Ovary and fallopian tube, right - HIGH GRADE SEROUS CARCINOMA, SEE COMMENT. 3. Ovary and fallopian tube, left - HIGH GRADE SEROUS CARCINOMA, SEE  COMMENT. Diagnosis Note 1. Nests and clusters of malignant cells are invading the omental tissue (part #1) with associated fibrosis. The cells are pleomorphic with prominent nucleoli. There are scattered psammoma bodies. The ovaries are atrophic and exhibit multiple foci of surface based invasive carcinoma and associated fibrosis. The fallopian tubes have a few foci of carcinoma, also superficially located. There are no precursor lesions noted in the ovaries or fallopian tubes (the entire tubes and ovaries were submitted for evaluation). While there is some retraction artifact, there are several foci suspicious for lymphovascular invasion. 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 Given the fibrosis, there does appear to be a small amount of treatment effect, however, there is abundant residual tumor.       03/29/2014 Surgery    Procedure(s) Performed: 1. Exploratory laparotomy with bilateral salpingo-oophorectomy, omentectomy radical tumor debulking for ovarian cancer .  Surgeon: Thereasa Solo, MD.  Assistant Surgeon: Lahoma Crocker, M.D. Assistant: (an MD assistant was necessary for tissue manipulation, retraction and positioning due to the complexity of the case and hospital policies).  Operative Findings: 10cm omental cake from hepatic to splenic flexure, densely adherent to transverse colon. Milliary studding of tumor implants (<17m, too numerous in number to count) adherent to the mesentery of the small bowel and small bowel wall. Small volume (100cc) ascites. Small ovaries bilaterally, densely adherent to the pelvic cul de sac. Left ovary and tube densely adherent to sigmoid colon. Cirrhotic liver with hepatomegaly and splenomegaly.     This represented an optimal cytoreduction (R1) with visible disease residual on the bowel wall and mesentery (millial, <331m implants).        04/18/2014 Tumor Marker    Patient's tumor was tested for the following markers: CA125 Results of the tumor marker test revealed 122      05/16/2014 Tumor Marker    Patient's tumor was tested for the following markers: CA125 Results of the tumor marker test revealed 30      06/10/2014 Tumor Marker    Patient's tumor was tested for the following markers: CA125 Results of the tumor marker test revealed 19      07/04/2014 Imaging    Interval improvement in the appearance of peritoneal metastasis secondary to ovarian cancer. 2. 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       08/18/2014 Tumor Marker    Patient's tumor was tested for the following markers: CA125 Results of the tumor marker test revealed 16      10/13/2014 Tumor Marker    Patient's tumor was tested for the following markers: CA125 Results of the tumor marker test revealed 14      11/21/2014 Tumor Marker    Patient's tumor was tested for the following markers: CA125 Results of the tumor marker test revealed 16      12/28/2014 Tumor Marker    Patient's tumor was tested for the following markers: CA125 Results of the tumor marker test revealed 762  02/27/2015 Tumor Marker    Patient's tumor was tested for the following markers: CA125 Results of the tumor marker test revealed 20.2      03/22/2015 Imaging    Filler appearance to the prior exam, with very subtle fluid tracking along portions of the liver, and very minimal nodularity along the right paracolic gutter representing residua from the prior peritoneal cancer. The current abnormalities could simply be post therapy findings rather than necessarily representing residual malignancy. No new or enlarging lesions are identified. 2. Hepatic cirrhosis and splenomegaly. There is some gastric varices suggesting portal venous hypertension. 3. Left foraminal stenosis at L4-5 due to spurring. There is likely also some central narrowing  of the thecal sac at this level. 4. Bibasilar scarring. 5. Chronic mild left mid kidney scarring      03/27/2015 Tumor Marker    Patient's tumor was tested for the following markers: CA125 Results of the tumor marker test revealed 25.3      04/24/2015 Imaging    Disc bulge L2-3 with mild spinal stenosis and right foraminal encroachment. 2. Disc bulge L3-4 with mild spinal stenosis. 3. Multifactorial spinal, left lateral recess and foraminal stenosis L4-5. There is grade 1 anterolisthesis without evident dynamic instability.      05/29/2015 Tumor Marker    Patient's tumor was tested for the following markers: CA125 Results of the tumor marker test revealed 29.9      08/21/2015 Tumor Marker    Patient's tumor was tested for the following markers: CA125 Results of the tumor marker test revealed 45      09/18/2015 Tumor Marker    Patient's tumor was tested for the following markers: CA125 Results of the tumor marker test revealed 47      09/27/2015 Imaging    New small bowel mesenteric nodule and enlarged left external iliac lymph node, highly worrisome for metastatic disease. 2. Cirrhosis and splenomegaly      10/10/2015 Tumor Marker    Patient's tumor was tested for the following markers: CA125 Results of the tumor marker test revealed 53.4      10/10/2015 - 12/11/2015 Chemotherapy    She received 4 cycles of carboplatin only      11/20/2015 Tumor Marker    Patient's tumor was tested for the following markers: CA125 Results of the tumor marker test revealed 41.3      12/20/2015 Imaging    CT: Mild left external iliac lymphadenopathy is slightly decreased. Mildly enlarged lower mesenteric nodule is slightly decreased. 2. No new or progressive metastatic disease in the abdomen or pelvis. 3. Cirrhosis. Stable subcentimeter hypodense left liver lobe lesion. No new liver lesions. 4. Stable mild splenomegaly.  No ascites. 5. Mild sigmoid diverticulosis.      01/01/2016 -  Anti-estrogen  oral therapy    She was placed on tamoxifen. Had letrozole initially but switched to tamoxifen due to poor tolerance      01/15/2016 Tumor Marker    Patient's tumor was tested for the following markers: CA125 Results of the tumor marker test revealed 31.4      02/19/2016 Tumor Marker    Patient's tumor was tested for the following markers: CA125 Results of the tumor marker test revealed 36.5      05/13/2016 Imaging    No evidence of disease progression within the abdomen or pelvis. Previously noted small central mesenteric nodule and left external iliac node appear slightly smaller. 2. Stable changes of hepatic cirrhosis and portal hypertension with associated splenomegaly. No new or  enlarging hepatic lesions are identified. 3. No acute findings.      05/13/2016 Tumor Marker    Patient's tumor was tested for the following markers: CA125 Results of the tumor marker test revealed 41.2      06/24/2016 Tumor Marker    Patient's tumor was tested for the following markers: CA125 Results of the tumor marker test revealed 47.3      08/20/2016 Imaging    1. The left external iliac lymph node is slightly increased in size compared to the prior exam, currently 1.1 cm and previously 0.9 cm in diameter. 2. Stable central mesenteric lymph node at 0.9 cm in diameter. 3. Stable trace free pelvic fluid and stable slight thickening along the right paracolic gutter, without well-defined peritoneal nodularity. 4. Other imaging findings of potential clinical significance: Subsegmental atelectasis or scarring in the lung bases. Hepatic cirrhosis. Left renal scarring. Mild splenomegaly. Sigmoid colon diverticulosis. Impingement at L4-5 due to spondylosis and degenerative disc disease broad Schmorl' s nodes at L3-L4. Pelvic floor laxity      08/23/2016 Tumor Marker    Patient's tumor was tested for the following markers: CA125 Results of the tumor marker test revealed 80.2      09/04/2016 Imaging    LV EF: 60%  -   65%      09/12/2016 Tumor Marker    Patient's tumor was tested for the following markers: CA125 Results of the tumor marker test revealed 98.5      09/12/2016 -  Chemotherapy    The patient received Doxil and Avastin.       09/26/2016 Tumor Marker    Patient's tumor was tested for the following markers: CA125 Results of the tumor marker test revealed 68.9      10/10/2016 Tumor Marker    Patient's tumor was tested for the following markers: CA125 Results of the tumor marker test revealed 65.6      11/07/2016 Tumor Marker    Patient's tumor was tested for the following markers: CA125 Results of the tumor marker test revealed 46.4      11/29/2016 Imaging    Stable mild peritoneal thickening, suspicious for peritoneal carcinomatosis. No new or progressive disease identified. No evidence of ascites.  No significant change in 10 mm left external iliac and 8 mm mesenteric lymph nodes.  Stable hepatic cirrhosis, and splenomegaly consistent with portal venous hypertension.  Colonic diverticulosis, without radiographic evidence of diverticulitis.      12/02/2016 Imaging    Normal LV size with EF 55%. Basal inferior and basal inferoseptal hypokinesis. Normal RV size and systolic function. No significant valvular abnormalities.      12/05/2016 Tumor Marker    Patient's tumor was tested for the following markers: CA125 Results of the tumor marker test revealed 49.1      01/09/2017 Tumor Marker    Patient's tumor was tested for the following markers: CA125 Results of the tumor marker test revealed 47.2       INTERVAL HISTORY: Please see below for problem oriented charting. She returns for further follow-up She complained of recent sinus congestion No cough, fever or chills She denies chest pain, shortness of breath or leg swelling Her appetite is stable, no recent weight loss She denies abdominal bloating, changes in bowel habits or vaginal bleeding  REVIEW OF SYSTEMS:    Constitutional: Denies fevers, chills or abnormal weight loss Eyes: Denies blurriness of vision Ears, nose, mouth, throat, and face: Denies mucositis or sore throat Respiratory: Denies cough, dyspnea or  wheezes Cardiovascular: Denies palpitation, chest discomfort or lower extremity swelling Gastrointestinal:  Denies nausea, heartburn or change in bowel habits Skin: Denies abnormal skin rashes Lymphatics: Denies new lymphadenopathy or easy bruising Neurological:Denies numbness, tingling or new weaknesses Behavioral/Psych: Mood is stable, no new changes  All other systems were reviewed with the patient and are negative.  I have reviewed the past medical history, past surgical history, social history and family history with the patient and they are unchanged from previous note.  ALLERGIES:  is allergic to shellfish allergy; benadryl [diphenhydramine hcl]; penicillins; and tape.  MEDICATIONS:  Current Outpatient Medications  Medication Sig Dispense Refill  . amLODipine (NORVASC) 5 MG tablet Take 5 mg by mouth daily.  0  . aspirin EC 81 MG tablet Take 81 mg by mouth daily.    . bisoprolol (ZEBETA) 5 MG tablet   0  . cholecalciferol (VITAMIN D) 1000 units tablet Take 1,000 Units by mouth daily.    Marland Kitchen gabapentin (NEURONTIN) 300 MG capsule Take 2 capsules (600 mg total) by mouth 3 (three) times daily. 120 capsule 3  . HYDROcodone-acetaminophen (NORCO/VICODIN) 5-325 MG tablet take 1 tablet by mouth every 6 hours if needed for MODERATE PAIN 60 tablet 0  . insulin degludec (TRESIBA) 100 UNIT/ML SOPN FlexTouch Pen Inject 18 Units into the skin daily.     Marland Kitchen lidocaine-prilocaine (EMLA) cream Apply to Porta-cath  1-2 hours prior to access as directed. 30 g 1  . metFORMIN (GLUCOPHAGE-XR) 500 MG 24 hr tablet Take 1,000 mg by mouth 2 (two) times daily.   0  . ondansetron (ZOFRAN) 8 MG tablet Take 1 tablet (8 mg total) by mouth every 8 (eight) hours as needed for nausea. 30 tablet 3  . pantoprazole  (PROTONIX) 40 MG tablet Take 40 mg by mouth daily.   0  . promethazine (PHENERGAN) 25 MG tablet Take 1 tablet (25 mg total) by mouth every 6 (six) hours as needed for nausea. 30 tablet 3  . rotigotine (NEUPRO) 4 MG/24HR Place 1 patch onto the skin daily. 30 patch 5   No current facility-administered medications for this visit.     PHYSICAL EXAMINATION: ECOG PERFORMANCE STATUS: 1 - Symptomatic but completely ambulatory  Vitals:   01/23/17 0840  BP: (!) 143/62  Pulse: 71  Resp: 18  Temp: 98.7 F (37.1 C)  SpO2: 98%   Filed Weights   01/23/17 0840  Weight: 192 lb 6.4 oz (87.3 kg)    GENERAL:alert, no distress and comfortable.  She is moderately obese SKIN: skin color, texture, turgor are normal, no rashes or significant lesions EYES: normal, Conjunctiva are pink and non-injected, sclera clear OROPHARYNX:no exudate, no erythema and lips, buccal mucosa, and tongue normal  NECK: supple, thyroid normal size, non-tender, without nodularity LYMPH:  no palpable lymphadenopathy in the cervical, axillary or inguinal LUNGS: clear to auscultation and percussion with normal breathing effort HEART: regular rate & rhythm and no murmurs and no lower extremity edema ABDOMEN:abdomen soft, non-tender and normal bowel sounds Musculoskeletal:no cyanosis of digits and no clubbing  NEURO: alert & oriented x 3 with fluent speech, no focal motor/sensory deficits  LABORATORY DATA:  I have reviewed the data as listed    Component Value Date/Time   NA 142 01/23/2017 0754   K 3.9 01/23/2017 0754   CL 105 03/30/2014 0618   CO2 23 01/23/2017 0754   GLUCOSE 147 (H) 01/23/2017 0754   BUN 12.4 01/23/2017 0754   CREATININE 0.8 01/23/2017 0754   CALCIUM 8.6 01/23/2017  0754   PROT 7.2 01/23/2017 0754   ALBUMIN 3.8 01/23/2017 0754   AST 53 (H) 01/23/2017 0754   ALT 53 01/23/2017 0754   ALKPHOS 115 01/23/2017 0754   BILITOT 0.82 01/23/2017 0754   GFRNONAA 86 (L) 03/30/2014 0618   GFRAA >90 03/30/2014  0618    No results found for: SPEP, UPEP  Lab Results  Component Value Date   WBC 4.5 01/23/2017   NEUTROABS 3.1 01/23/2017   HGB 10.6 (L) 01/23/2017   HCT 34.8 01/23/2017   MCV 88.3 01/23/2017   PLT 88 (L) 01/23/2017      Chemistry      Component Value Date/Time   NA 142 01/23/2017 0754   K 3.9 01/23/2017 0754   CL 105 03/30/2014 0618   CO2 23 01/23/2017 0754   BUN 12.4 01/23/2017 0754   CREATININE 0.8 01/23/2017 0754      Component Value Date/Time   CALCIUM 8.6 01/23/2017 0754   ALKPHOS 115 01/23/2017 0754   AST 53 (H) 01/23/2017 0754   ALT 53 01/23/2017 0754   BILITOT 0.82 01/23/2017 0754      ASSESSMENT & PLAN:  Primary peritoneal carcinomatosis ([place]) The patient had recent positive response to treatment CT scan is quite unremarkable and I could not really see any well-defined disease She could potentially continue on maintenance Avastin We discussed the risks, benefits, side effects of continuing maintenance therapy and she agreed to proceed I will continue close blood count and tumor marker monitoring once a month I plan to repeat CT imaging study again in January 2019  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 The patient denies recent history of bleeding such as epistaxis, hematuria or hematochezia. She is asymptomatic from the low platelet count. I will observe for now.    Iron deficiency anemia She had responded well to intravenous iron recently I plan to repeat iron study in the next visit  Sinus congestion She had recent  sinus congestion Clinically, it does not appear to be serious and I do not feel strongly she needs antibiotic therapy I recommend conservative management with over-the-counter decongestion only   Orders Placed This Encounter  Procedures  . CT ABDOMEN PELVIS W CONTRAST    Standing Status:   Future    Standing Expiration Date:   01/23/2018    Order Specific Question:   If indicated for the ordered procedure, I authorize the administration of contrast media per Radiology protocol    Answer:   Yes    Order Specific Question:   Preferred imaging location?    Answer:   [facility]    Order Specific Question:   Radiology Contrast Protocol - do NOT remove file path    Answer:   [email or web address]  . Ferritin    Standing Status:   Future    Standing Expiration Date:   02/27/2018  . Iron and TIBC    Standing Status:   Future    Standing Expiration Date:   02/27/2018   All questions were answered. The patient knows to call the clinic with any problems, questions or concerns. No barriers to  learning was detected. I spent 20 minutes counseling the patient face to face. The total time spent in the appointment was 30 minutes and more than 50% was on counseling and review of test results     [name], MD 01/24/2017 1:03 PM

## 2017-02-06 ENCOUNTER — Ambulatory Visit (HOSPITAL_BASED_OUTPATIENT_CLINIC_OR_DEPARTMENT_OTHER): Payer: Medicare Other

## 2017-02-06 ENCOUNTER — Ambulatory Visit: Payer: Medicare Other

## 2017-02-06 ENCOUNTER — Other Ambulatory Visit (HOSPITAL_BASED_OUTPATIENT_CLINIC_OR_DEPARTMENT_OTHER): Payer: Medicare Other

## 2017-02-06 VITALS — BP 149/66 | HR 73 | Temp 98.4°F | Resp 18 | Ht 61.5 in | Wt 192.0 lb

## 2017-02-06 DIAGNOSIS — D509 Iron deficiency anemia, unspecified: Secondary | ICD-10-CM | POA: Diagnosis not present

## 2017-02-06 DIAGNOSIS — Z5112 Encounter for antineoplastic immunotherapy: Secondary | ICD-10-CM | POA: Diagnosis present

## 2017-02-06 DIAGNOSIS — C482 Malignant neoplasm of peritoneum, unspecified: Secondary | ICD-10-CM

## 2017-02-06 DIAGNOSIS — C569 Malignant neoplasm of unspecified ovary: Secondary | ICD-10-CM

## 2017-02-06 LAB — CBC WITH DIFFERENTIAL/PLATELET
BASO%: 0.9 % (ref 0.0–2.0)
Basophils Absolute: 0 10*3/uL (ref 0.0–0.1)
EOS%: 2.4 % (ref 0.0–7.0)
Eosinophils Absolute: 0.1 10*3/uL (ref 0.0–0.5)
HCT: 32.3 % — ABNORMAL LOW (ref 34.8–46.6)
HGB: 10.1 g/dL — ABNORMAL LOW (ref 11.6–15.9)
LYMPH%: 22.5 % (ref 14.0–49.7)
MCH: 26.1 pg (ref 25.1–34.0)
MCHC: 31.4 g/dL — ABNORMAL LOW (ref 31.5–36.0)
MCV: 83.1 fL (ref 79.5–101.0)
MONO#: 0.2 10*3/uL (ref 0.1–0.9)
MONO%: 5.1 % (ref 0.0–14.0)
NEUT#: 2.7 10*3/uL (ref 1.5–6.5)
NEUT%: 69.1 % (ref 38.4–76.8)
Platelets: 99 10*3/uL — ABNORMAL LOW (ref 145–400)
RBC: 3.89 10*6/uL (ref 3.70–5.45)
RDW: 21.3 % — ABNORMAL HIGH (ref 11.2–14.5)
WBC: 3.9 10*3/uL (ref 3.9–10.3)
lymph#: 0.9 10*3/uL (ref 0.9–3.3)

## 2017-02-06 LAB — IRON AND TIBC
%SAT: 8 % — ABNORMAL LOW (ref 21–57)
Iron: 37 ug/dL — ABNORMAL LOW (ref 41–142)
TIBC: 450 ug/dL — ABNORMAL HIGH (ref 236–444)
UIBC: 413 ug/dL — ABNORMAL HIGH (ref 120–384)

## 2017-02-06 LAB — COMPREHENSIVE METABOLIC PANEL
ALT: 54 U/L (ref 0–55)
AST: 47 U/L — ABNORMAL HIGH (ref 5–34)
Albumin: 3.7 g/dL (ref 3.5–5.0)
Alkaline Phosphatase: 130 U/L (ref 40–150)
Anion Gap: 12 mEq/L — ABNORMAL HIGH (ref 3–11)
BUN: 13.5 mg/dL (ref 7.0–26.0)
CO2: 22 mEq/L (ref 22–29)
Calcium: 8.5 mg/dL (ref 8.4–10.4)
Chloride: 107 mEq/L (ref 98–109)
Creatinine: 0.8 mg/dL (ref 0.6–1.1)
EGFR: >60 mL/min/{1.73_m2} (ref >60)
Glucose: 156 mg/dl — ABNORMAL HIGH (ref 70–140)
Potassium: 3.9 mEq/L (ref 3.5–5.1)
Sodium: 142 mEq/L (ref 136–145)
Total Bilirubin: 0.65 mg/dL (ref 0.20–1.20)
Total Protein: 6.9 g/dL (ref 6.4–8.3)

## 2017-02-06 LAB — UA PROTEIN, DIPSTICK - CHCC: Protein, ur: NEGATIVE mg/dL

## 2017-02-06 LAB — FERRITIN: Ferritin: 15 ng/ml (ref 9–269)

## 2017-02-06 MED ORDER — SODIUM CHLORIDE 0.9 % IV SOLN
10.2000 mg/kg | Freq: Once | INTRAVENOUS | Status: AC
  Administered 2017-02-06: 900 mg via INTRAVENOUS
  Filled 2017-02-06: qty 4

## 2017-02-06 MED ORDER — SODIUM CHLORIDE 0.9% FLUSH
10.0000 mL | INTRAVENOUS | Status: DC | PRN
  Administered 2017-02-06: 10 mL
  Filled 2017-02-06: qty 10

## 2017-02-06 MED ORDER — HEPARIN SOD (PORK) LOCK FLUSH 100 UNIT/ML IV SOLN
500.0000 [IU] | Freq: Once | INTRAVENOUS | Status: AC | PRN
  Administered 2017-02-06: 500 [IU]
  Filled 2017-02-06: qty 5

## 2017-02-06 MED ORDER — SODIUM CHLORIDE 0.9% FLUSH
10.0000 mL | Freq: Once | INTRAVENOUS | Status: AC
  Administered 2017-02-06: 10 mL
  Filled 2017-02-06: qty 10

## 2017-02-06 MED ORDER — SODIUM CHLORIDE 0.9 % IV SOLN
Freq: Once | INTRAVENOUS | Status: AC
  Administered 2017-02-06: 10:00:00 via INTRAVENOUS

## 2017-02-06 NOTE — Patient Instructions (Signed)
[place] Discharge Instructions for Patients Receiving Chemotherapy  Today you received the following chemotherapy agents: Bevacizumab (Avastin).  To help prevent nausea and vomiting after your treatment, we encourage you to take your nausea medication as prescribed.  If you develop nausea and vomiting that is not controlled by your nausea medication, call the clinic.   BELOW ARE SYMPTOMS THAT SHOULD BE REPORTED IMMEDIATELY:  *FEVER GREATER THAN 100.5 F  *CHILLS WITH OR WITHOUT FEVER  NAUSEA AND VOMITING THAT IS NOT CONTROLLED WITH YOUR NAUSEA MEDICATION  *UNUSUAL SHORTNESS OF BREATH  *UNUSUAL BRUISING OR BLEEDING  TENDERNESS IN MOUTH AND THROAT WITH OR WITHOUT PRESENCE OF ULCERS  *URINARY PROBLEMS  *BOWEL PROBLEMS  UNUSUAL RASH Items with * indicate a potential emergency and should be followed up as soon as possible.  Feel free to call the clinic should you have any questions or concerns. The clinic phone number is [phone].  Please show the [place] at check-in to the Emergency Department and triage nurse.

## 2017-02-06 NOTE — Progress Notes (Signed)
Per Dr. Alen Blew, [place] to treat with plts of 99,000.

## 2017-02-07 LAB — CA 125: Cancer Antigen (CA) 125: 44.1 U/mL — ABNORMAL HIGH (ref 0.0–38.1)

## 2017-02-10 ENCOUNTER — Other Ambulatory Visit: Payer: Self-pay | Admitting: Neurology

## 2017-02-10 MED ORDER — ROTIGOTINE 4 MG/24HR TD PT24: 1.0000 | MEDICATED_PATCH | Freq: Every day | TRANSDERMAL | 1 refills | Status: DC

## 2017-02-12 ENCOUNTER — Other Ambulatory Visit: Payer: Self-pay | Admitting: Hematology and Oncology

## 2017-02-17 ENCOUNTER — Telehealth: Payer: Self-pay | Admitting: Neurology

## 2017-02-17 NOTE — Telephone Encounter (Signed)
Form complete and sent to [facility] as requested.

## 2017-02-17 NOTE — Telephone Encounter (Signed)
Fax # [phone] will be faxing over a form. Thanks

## 2017-02-18 ENCOUNTER — Telehealth: Payer: Self-pay | Admitting: Neurology

## 2017-02-18 ENCOUNTER — Ambulatory Visit (HOSPITAL_COMMUNITY)
Admission: RE | Admit: 2017-02-18 | Discharge: 2017-02-18 | Disposition: A | Discharge status: Home / Self Care | Payer: Medicare Other | Source: Ambulatory Visit | Attending: Hematology and Oncology | Admitting: Hematology and Oncology

## 2017-02-18 ENCOUNTER — Encounter (HOSPITAL_COMMUNITY): Payer: Self-pay

## 2017-02-18 DIAGNOSIS — C569 Malignant neoplasm of unspecified ovary: Secondary | ICD-10-CM | POA: Insufficient documentation

## 2017-02-18 DIAGNOSIS — K5730 Diverticulosis of large intestine without perforation or abscess without bleeding: Secondary | ICD-10-CM | POA: Insufficient documentation

## 2017-02-18 DIAGNOSIS — R161 Splenomegaly, not elsewhere classified: Secondary | ICD-10-CM | POA: Diagnosis not present

## 2017-02-18 DIAGNOSIS — K7460 Unspecified cirrhosis of liver: Secondary | ICD-10-CM | POA: Diagnosis not present

## 2017-02-18 MED ORDER — IOPAMIDOL (ISOVUE-300) INJECTION 61%
INTRAVENOUS | Status: AC
  Filled 2017-02-18: qty 30

## 2017-02-18 MED ORDER — IOPAMIDOL (ISOVUE-300) INJECTION 61%
30.0000 mL | Freq: Once | INTRAVENOUS | Status: AC | PRN
  Administered 2017-02-18: 30 mL via ORAL

## 2017-02-18 MED ORDER — IOPAMIDOL (ISOVUE-300) INJECTION 61%
INTRAVENOUS | Status: AC
  Filled 2017-02-18: qty 100

## 2017-02-18 MED ORDER — HEPARIN SOD (PORK) LOCK FLUSH 100 UNIT/ML IV SOLN
INTRAVENOUS | Status: AC
  Administered 2017-02-18: 500 [IU]
  Filled 2017-02-18: qty 5

## 2017-02-18 MED ORDER — IOPAMIDOL (ISOVUE-300) INJECTION 61%
100.0000 mL | Freq: Once | INTRAVENOUS | Status: AC | PRN
  Administered 2017-02-18: 100 mL via INTRAVENOUS

## 2017-02-18 NOTE — Telephone Encounter (Signed)
Received VM from [facility] Medicare that [employer] approved for one year.

## 2017-02-20 ENCOUNTER — Inpatient Hospital Stay: Payer: Medicare Other

## 2017-02-20 ENCOUNTER — Telehealth: Payer: Self-pay | Admitting: Hematology and Oncology

## 2017-02-20 ENCOUNTER — Encounter: Payer: Self-pay | Admitting: Hematology and Oncology

## 2017-02-20 ENCOUNTER — Inpatient Hospital Stay: Payer: Medicare Other | Attending: Hematology and Oncology | Admitting: Hematology and Oncology

## 2017-02-20 VITALS — BP 144/57

## 2017-02-20 DIAGNOSIS — Z79899 Other long term (current) drug therapy: Secondary | ICD-10-CM | POA: Insufficient documentation

## 2017-02-20 DIAGNOSIS — Z794 Long term (current) use of insulin: Secondary | ICD-10-CM | POA: Diagnosis not present

## 2017-02-20 DIAGNOSIS — D61818 Other pancytopenia: Secondary | ICD-10-CM

## 2017-02-20 DIAGNOSIS — C569 Malignant neoplasm of unspecified ovary: Secondary | ICD-10-CM | POA: Diagnosis not present

## 2017-02-20 DIAGNOSIS — M48061 Spinal stenosis, lumbar region without neurogenic claudication: Secondary | ICD-10-CM | POA: Insufficient documentation

## 2017-02-20 DIAGNOSIS — Z5112 Encounter for antineoplastic immunotherapy: Secondary | ICD-10-CM | POA: Insufficient documentation

## 2017-02-20 DIAGNOSIS — R599 Enlarged lymph nodes, unspecified: Secondary | ICD-10-CM | POA: Insufficient documentation

## 2017-02-20 DIAGNOSIS — K766 Portal hypertension: Secondary | ICD-10-CM | POA: Diagnosis not present

## 2017-02-20 DIAGNOSIS — G8929 Other chronic pain: Secondary | ICD-10-CM

## 2017-02-20 DIAGNOSIS — Z170 Estrogen receptor positive status [ER+]: Secondary | ICD-10-CM | POA: Diagnosis not present

## 2017-02-20 DIAGNOSIS — C786 Secondary malignant neoplasm of retroperitoneum and peritoneum: Secondary | ICD-10-CM | POA: Diagnosis not present

## 2017-02-20 DIAGNOSIS — I10 Essential (primary) hypertension: Secondary | ICD-10-CM | POA: Diagnosis not present

## 2017-02-20 DIAGNOSIS — Z79810 Long term (current) use of selective estrogen receptor modulators (SERMs): Secondary | ICD-10-CM

## 2017-02-20 DIAGNOSIS — K5730 Diverticulosis of large intestine without perforation or abscess without bleeding: Secondary | ICD-10-CM | POA: Insufficient documentation

## 2017-02-20 DIAGNOSIS — Z9221 Personal history of antineoplastic chemotherapy: Secondary | ICD-10-CM

## 2017-02-20 DIAGNOSIS — G893 Neoplasm related pain (acute) (chronic): Secondary | ICD-10-CM | POA: Diagnosis not present

## 2017-02-20 DIAGNOSIS — M545 Low back pain: Secondary | ICD-10-CM

## 2017-02-20 DIAGNOSIS — D509 Iron deficiency anemia, unspecified: Secondary | ICD-10-CM | POA: Insufficient documentation

## 2017-02-20 DIAGNOSIS — I429 Cardiomyopathy, unspecified: Secondary | ICD-10-CM | POA: Diagnosis not present

## 2017-02-20 DIAGNOSIS — C482 Malignant neoplasm of peritoneum, unspecified: Secondary | ICD-10-CM

## 2017-02-20 DIAGNOSIS — Z7982 Long term (current) use of aspirin: Secondary | ICD-10-CM

## 2017-02-20 DIAGNOSIS — R161 Splenomegaly, not elsewhere classified: Secondary | ICD-10-CM

## 2017-02-20 DIAGNOSIS — K7581 Nonalcoholic steatohepatitis (NASH): Secondary | ICD-10-CM | POA: Insufficient documentation

## 2017-02-20 DIAGNOSIS — I427 Cardiomyopathy due to drug and external agent: Secondary | ICD-10-CM

## 2017-02-20 DIAGNOSIS — Z90710 Acquired absence of both cervix and uterus: Secondary | ICD-10-CM | POA: Insufficient documentation

## 2017-02-20 DIAGNOSIS — K7460 Unspecified cirrhosis of liver: Secondary | ICD-10-CM | POA: Diagnosis not present

## 2017-02-20 LAB — CBC WITH DIFFERENTIAL/PLATELET
Basophils Absolute: 0 10*3/uL (ref 0.0–0.1)
Basophils Relative: 1 %
Eosinophils Absolute: 0.1 10*3/uL (ref 0.0–0.5)
Eosinophils Relative: 3 %
HCT: 33.4 % — ABNORMAL LOW (ref 34.8–46.6)
Hemoglobin: 10.4 g/dL — ABNORMAL LOW (ref 11.6–15.9)
Lymphocytes Relative: 25 %
Lymphs Abs: 0.9 10*3/uL (ref 0.9–3.3)
MCH: 25.6 pg (ref 25.1–34.0)
MCHC: 31.2 g/dL — ABNORMAL LOW (ref 31.5–36.0)
MCV: 82.1 fL (ref 79.5–101.0)
Monocytes Absolute: 0.2 10*3/uL (ref 0.1–0.9)
Monocytes Relative: 4 %
Neutro Abs: 2.6 10*3/uL (ref 1.5–6.5)
Neutrophils Relative %: 67 %
Platelets: 105 10*3/uL — ABNORMAL LOW (ref 145–400)
RBC: 4.06 MIL/uL (ref 3.70–5.45)
RDW: 20.1 % — ABNORMAL HIGH (ref 11.2–16.1)
WBC: 3.8 10*3/uL — ABNORMAL LOW (ref 3.9–10.3)

## 2017-02-20 LAB — COMPREHENSIVE METABOLIC PANEL
ALT: 52 U/L (ref 0–55)
AST: 54 U/L — ABNORMAL HIGH (ref 5–34)
Albumin: 3.8 g/dL (ref 3.5–5.0)
Alkaline Phosphatase: 96 U/L (ref 40–150)
Anion gap: 9 (ref 3–11)
BUN: 10 mg/dL (ref 7–26)
CO2: 22 mmol/L (ref 22–29)
Calcium: 8.6 mg/dL (ref 8.4–10.4)
Chloride: 110 mmol/L — ABNORMAL HIGH (ref 98–109)
Creatinine, Ser: 0.8 mg/dL (ref 0.60–1.10)
GFR calc Af Amer: >60 mL/min (ref >60)
GFR calc non Af Amer: >60 mL/min (ref >60)
Glucose, Bld: 139 mg/dL (ref 70–140)
Potassium: 4.2 mmol/L (ref 3.3–4.7)
Sodium: 141 mmol/L (ref 136–145)
Total Bilirubin: 0.8 mg/dL (ref 0.2–1.2)
Total Protein: 7.2 g/dL (ref 6.4–8.3)

## 2017-02-20 MED ORDER — PROCHLORPERAZINE MALEATE 10 MG PO TABS
ORAL_TABLET | ORAL | Status: AC
  Filled 2017-02-20: qty 1

## 2017-02-20 MED ORDER — HEPARIN SOD (PORK) LOCK FLUSH 100 UNIT/ML IV SOLN
500.0000 [IU] | Freq: Once | INTRAVENOUS | Status: AC | PRN
  Administered 2017-02-20: 500 [IU]
  Filled 2017-02-20: qty 5

## 2017-02-20 MED ORDER — SODIUM CHLORIDE 0.9 % IV SOLN
Freq: Once | INTRAVENOUS | Status: AC
  Administered 2017-02-20: 10:00:00 via INTRAVENOUS

## 2017-02-20 MED ORDER — SODIUM CHLORIDE 0.9 % IV SOLN
10.2000 mg/kg | Freq: Once | INTRAVENOUS | Status: AC
  Administered 2017-02-20: 900 mg via INTRAVENOUS
  Filled 2017-02-20: qty 36

## 2017-02-20 MED ORDER — SODIUM CHLORIDE 0.9% FLUSH
10.0000 mL | INTRAVENOUS | Status: DC | PRN
  Administered 2017-02-20: 10 mL
  Filled 2017-02-20: qty 10

## 2017-02-20 MED ORDER — HYDROCODONE-ACETAMINOPHEN 5-325 MG PO TABS: ORAL_TABLET | ORAL | 0 refills | Status: DC

## 2017-02-20 MED ORDER — SODIUM CHLORIDE 0.9 % IV SOLN
510.0000 mg | Freq: Once | INTRAVENOUS | Status: AC
  Administered 2017-02-20: 510 mg via INTRAVENOUS
  Filled 2017-02-20: qty 17

## 2017-02-20 MED ORDER — SODIUM CHLORIDE 0.9% FLUSH
10.0000 mL | Freq: Once | INTRAVENOUS | Status: AC
  Administered 2017-02-20: 10 mL
  Filled 2017-02-20: qty 10

## 2017-02-20 NOTE — Progress Notes (Signed)
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  No worrisome liver mass.  There are associated portal venous collaterals and splenomegaly consistent with portal venous hypertension. 2.  No other significant upper abdominal findings      11/08/2013 Imaging    US showed ascites      11/08/2013 Initial Diagnosis    Patient had ~ 2 months of some urinary incontinence, saw Dr Ronita Hipps in 10-2013, with pelvic mass on exam       11/12/2013 Imaging    There are findings of progressive hepatic cirrhosis with a large amount of ascites and mild splenomegaly. There are venous collaterals present. 2. There is abnormal thickening of the peritoneal surface along the left mid and lower abdominal wall. There is abnormal soft tissue density in the pelvis as well which could reflect the clinically suspected ovarian malignancy but the findings are nondiagnostic. 3. There is a new small left pleural effusion. 4. There is no acute bowel abnormality.      11/18/2013 Imaging    Successful ultrasound guided diagnostic and therapeutic paracentesis yielding 4.1 liters of ascites.       11/18/2013 Pathology Results    PERITONEAL/ASCITIC FLUID (SPECIMEN 1 OF 1 COLLECTED 11/18/13): SEROUS CARCINOMA, PLEASE SEE COMMENT.      12/01/2013 Tumor Marker    Patient's tumor was tested for the following markers: CA125 Results of the tumor marker test revealed 1938      12/03/2013 Imaging    Successful ultrasound-guided therapeutic paracentesis yielding 3.6 liters of peritoneal fluid.      12/07/2013 - 05/30/2014 Chemotherapy    She received 3 cycles of carboplatin and Taxol, interrupted cycle 4 for surgery.  She  subsequently completed 3 more cycles of chemotherapy after surgery      12/20/2013 Imaging    Successful ultrasound-guided diagnostic and therapeutic paracentesis yielding 2.2 liters of peritoneal fluid      12/20/2013 Pathology Results    PERITONEAL/ASCITIC FLUID(SPECIMEN 1 OF 1 COLLECTED 12/20/13): MALIGNANT CELLS CONSISTENT WITH SEROUS CARCINOMA      01/13/2014 Tumor Marker    Patient's tumor was tested for the following markers: CA125 Results of the tumor marker test revealed 227      02/07/2014 Tumor Marker    Patient's tumor was tested for the following markers: CA125 Results of the tumor marker test revealed 130      02/15/2014 Genetic Testing    Genetics testing from 02-2014 normal ([employer] panel)      02/23/2014 Imaging    Interval decrease in ascites. 2. Morphologic changes in the liver consistent with cirrhosis. 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 Portal vein remains patent at this time. 3. Persistent but decreased abnormal soft tissue attenuation tracking in the omental fat. This may be secondary to interval improvement in metastatic disease. 4. Peritoneal thickening seen in the left abdomen and pelvis on the previous study has decreased and nearly resolved in the interval. This also suggests interval improvement in metastatic disease.       03/07/2014 Procedure    Ultrasound and fluoroscopically guided right internal jugular single lumen power port catheter insertion. Tip in the SVC/RA junction. Catheter ready for use.      03/17/2014  Tumor Marker    Patient's tumor was tested for the following markers: CA125 Results of the tumor marker test revealed 45      03/29/2014 Pathology Results    1. Omentum, resection for tumor - HIGH GRADE SEROUS CARCINOMA, SEE COMMENT. 2. Ovary and fallopian tube, right - HIGH GRADE SEROUS CARCINOMA, SEE COMMENT. 3. Ovary and fallopian tube, left - HIGH GRADE SEROUS CARCINOMA, SEE  COMMENT. Diagnosis Note 1. Nests and clusters of malignant cells are invading the omental tissue (part #1) with associated fibrosis. The cells are pleomorphic with prominent nucleoli. There are scattered psammoma bodies. The ovaries are atrophic and exhibit multiple foci of surface based invasive carcinoma and associated fibrosis. The fallopian tubes have a few foci of carcinoma, also superficially located. There are no precursor lesions noted in the ovaries or fallopian tubes (the entire tubes and ovaries were submitted for evaluation). While there is some retraction artifact, there are several foci suspicious for lymphovascular invasion. 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 Given the fibrosis, there does appear to be a small amount of treatment effect, however, there is abundant residual tumor.       03/29/2014 Surgery    Procedure(s) Performed: 1. Exploratory laparotomy with bilateral salpingo-oophorectomy, omentectomy radical tumor debulking for ovarian cancer .  Surgeon: Thereasa Solo, MD.  Assistant Surgeon: Lahoma Crocker, M.D. Assistant: (an MD assistant was necessary for tissue manipulation, retraction and positioning due to the complexity of the case and hospital policies).  Operative Findings: 10cm omental cake from hepatic to splenic flexure, densely adherent to transverse colon. Milliary studding of tumor implants (<56m, too numerous in number to count) adherent to the mesentery of the small bowel and small bowel wall. Small volume (100cc) ascites. Small ovaries bilaterally, densely adherent to the pelvic cul de sac. Left ovary and tube densely adherent to sigmoid colon. Cirrhotic liver with hepatomegaly and splenomegaly.     This represented an optimal cytoreduction (R1) with visible disease residual on the bowel wall and mesentery (millial, <343m implants).        04/18/2014 Tumor Marker    Patient's tumor was tested for the following markers: CA125 Results of the tumor marker test revealed 122      05/16/2014 Tumor Marker    Patient's tumor was tested for the following markers: CA125 Results of the tumor marker test revealed 30      06/10/2014 Tumor Marker    Patient's tumor was tested for the following markers: CA125 Results of the tumor marker test revealed 19      07/04/2014 Imaging    Interval improvement in the appearance of peritoneal metastasis secondary to ovarian cancer. 2. 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       08/18/2014 Tumor Marker    Patient's tumor was tested for the following markers: CA125 Results of the tumor marker test revealed 16      10/13/2014 Tumor Marker    Patient's tumor was tested for the following markers: CA125 Results of the tumor marker test revealed 14      11/21/2014 Tumor Marker    Patient's tumor was tested for the following markers: CA125 Results of the tumor marker test revealed 16      12/28/2014 Tumor Marker    Patient's tumor was tested for the following markers: CA125 Results of the tumor marker test revealed 762  02/27/2015 Tumor Marker    Patient's tumor was tested for the following markers: CA125 Results of the tumor marker test revealed 20.2      03/22/2015 Imaging    Filler appearance to the prior exam, with very subtle fluid tracking along portions of the liver, and very minimal nodularity along the right paracolic gutter representing residua from the prior peritoneal cancer. The current abnormalities could simply be post therapy findings rather than necessarily representing residual malignancy. No new or enlarging lesions are identified. 2. Hepatic cirrhosis and splenomegaly. There is some gastric varices suggesting portal venous hypertension. 3. Left foraminal stenosis at L4-5 due to spurring. There is likely also some central narrowing  of the thecal sac at this level. 4. Bibasilar scarring. 5. Chronic mild left mid kidney scarring      03/27/2015 Tumor Marker    Patient's tumor was tested for the following markers: CA125 Results of the tumor marker test revealed 25.3      04/24/2015 Imaging    Disc bulge L2-3 with mild spinal stenosis and right foraminal encroachment. 2. Disc bulge L3-4 with mild spinal stenosis. 3. Multifactorial spinal, left lateral recess and foraminal stenosis L4-5. There is grade 1 anterolisthesis without evident dynamic instability.      05/29/2015 Tumor Marker    Patient's tumor was tested for the following markers: CA125 Results of the tumor marker test revealed 29.9      08/21/2015 Tumor Marker    Patient's tumor was tested for the following markers: CA125 Results of the tumor marker test revealed 45      09/18/2015 Tumor Marker    Patient's tumor was tested for the following markers: CA125 Results of the tumor marker test revealed 47      09/27/2015 Imaging    New small bowel mesenteric nodule and enlarged left external iliac lymph node, highly worrisome for metastatic disease. 2. Cirrhosis and splenomegaly      10/10/2015 Tumor Marker    Patient's tumor was tested for the following markers: CA125 Results of the tumor marker test revealed 53.4      10/10/2015 - 12/11/2015 Chemotherapy    She received 4 cycles of carboplatin only      11/20/2015 Tumor Marker    Patient's tumor was tested for the following markers: CA125 Results of the tumor marker test revealed 41.3      12/20/2015 Imaging    CT: Mild left external iliac lymphadenopathy is slightly decreased. Mildly enlarged lower mesenteric nodule is slightly decreased. 2. No new or progressive metastatic disease in the abdomen or pelvis. 3. Cirrhosis. Stable subcentimeter hypodense left liver lobe lesion. No new liver lesions. 4. Stable mild splenomegaly.  No ascites. 5. Mild sigmoid diverticulosis.      01/01/2016 -  Anti-estrogen  oral therapy    She was placed on tamoxifen. Had letrozole initially but switched to tamoxifen due to poor tolerance      01/15/2016 Tumor Marker    Patient's tumor was tested for the following markers: CA125 Results of the tumor marker test revealed 31.4      02/19/2016 Tumor Marker    Patient's tumor was tested for the following markers: CA125 Results of the tumor marker test revealed 36.5      05/13/2016 Imaging    No evidence of disease progression within the abdomen or pelvis. Previously noted small central mesenteric nodule and left external iliac node appear slightly smaller. 2. Stable changes of hepatic cirrhosis and portal hypertension with associated splenomegaly. No new or  enlarging hepatic lesions are identified. 3. No acute findings.      05/13/2016 Tumor Marker    Patient's tumor was tested for the following markers: CA125 Results of the tumor marker test revealed 41.2      06/24/2016 Tumor Marker    Patient's tumor was tested for the following markers: CA125 Results of the tumor marker test revealed 47.3      08/20/2016 Imaging    1. The left external iliac lymph node is slightly increased in size compared to the prior exam, currently 1.1 cm and previously 0.9 cm in diameter. 2. Stable central mesenteric lymph node at 0.9 cm in diameter. 3. Stable trace free pelvic fluid and stable slight thickening along the right paracolic gutter, without well-defined peritoneal nodularity. 4. Other imaging findings of potential clinical significance: Subsegmental atelectasis or scarring in the lung bases. Hepatic cirrhosis. Left renal scarring. Mild splenomegaly. Sigmoid colon diverticulosis. Impingement at L4-5 due to spondylosis and degenerative disc disease broad Schmorl' s nodes at L3-L4. Pelvic floor laxity      08/23/2016 Tumor Marker    Patient's tumor was tested for the following markers: CA125 Results of the tumor marker test revealed 80.2      09/04/2016 Imaging    LV EF: 60%  -   65%      09/12/2016 Tumor Marker    Patient's tumor was tested for the following markers: CA125 Results of the tumor marker test revealed 98.5      09/12/2016 -  Chemotherapy    The patient received Doxil and Avastin.       09/26/2016 Tumor Marker    Patient's tumor was tested for the following markers: CA125 Results of the tumor marker test revealed 68.9      10/10/2016 Tumor Marker    Patient's tumor was tested for the following markers: CA125 Results of the tumor marker test revealed 65.6      11/07/2016 Tumor Marker    Patient's tumor was tested for the following markers: CA125 Results of the tumor marker test revealed 46.4      11/29/2016 Imaging    Stable mild peritoneal thickening, suspicious for peritoneal carcinomatosis. No new or progressive disease identified. No evidence of ascites.  No significant change in 10 mm left external iliac and 8 mm mesenteric lymph nodes.  Stable hepatic cirrhosis, and splenomegaly consistent with portal venous hypertension.  Colonic diverticulosis, without radiographic evidence of diverticulitis.      12/02/2016 Imaging    Normal LV size with EF 55%. Basal inferior and basal inferoseptal hypokinesis. Normal RV size and systolic function. No significant valvular abnormalities.      12/05/2016 Tumor Marker    Patient's tumor was tested for the following markers: CA125 Results of the tumor marker test revealed 49.1      01/09/2017 Tumor Marker    Patient's tumor was tested for the following markers: CA125 Results of the tumor marker test revealed 47.2      02/06/2017 Tumor Marker    Patient's tumor was tested for the following markers: CA125 Results of the tumor marker test revealed 44.1      02/18/2017 Imaging    Stable mild peritoneal thickening. No new or progressive disease identified within the abdomen or pelvis.  Stable tiny sub-cm left external iliac and mesenteric lymph nodes.  Stable hepatic cirrhosis, and  splenomegaly consistent with portal venous hypertension. No evidence of hepatic neoplasm.  Colonic diverticulosis, without radiographic evidence of diverticulitis.       INTERVAL  HISTORY: Please see below for problem oriented charting. She returns with her husband for further follow-up She feels well Chronic pain is stable She denies side effects from treatment No recent infection Denies abdominal discomfort, changes in her bowel habits or bloating. The patient denies any recent signs or symptoms of bleeding such as spontaneous epistaxis, hematuria or hematochezia. She has no signs or symptoms of congestive heart failure  REVIEW OF SYSTEMS:   Constitutional: Denies fevers, chills or abnormal weight loss Eyes: Denies blurriness of vision Ears, nose, mouth, throat, and face: Denies mucositis or sore throat Respiratory: Denies cough, dyspnea or wheezes Cardiovascular: Denies palpitation, chest discomfort or lower extremity swelling Gastrointestinal:  Denies nausea, heartburn or change in bowel habits Skin: Denies abnormal skin rashes Lymphatics: Denies new lymphadenopathy or easy bruising Neurological:Denies numbness, tingling or new weaknesses Behavioral/Psych: Mood is stable, no new changes  All other systems were reviewed with the patient and are negative.  I have reviewed the past medical history, past surgical history, social history and family history with the patient and they are unchanged from previous note.  ALLERGIES:  is allergic to shellfish allergy; benadryl [diphenhydramine hcl]; penicillins; and tape.  MEDICATIONS:  Current Outpatient Medications  Medication Sig Dispense Refill  . amLODipine (NORVASC) 5 MG tablet Take 5 mg by mouth daily.  0  . aspirin EC 81 MG tablet Take 81 mg by mouth daily.    . bisoprolol (ZEBETA) 5 MG tablet   0  . cholecalciferol (VITAMIN D) 1000 units tablet Take 1,000 Units by mouth daily.    Marland Kitchen gabapentin (NEURONTIN) 300 MG capsule Take  2 capsules (600 mg total) by mouth 3 (three) times daily. 120 capsule 3  . HYDROcodone-acetaminophen (NORCO/VICODIN) 5-325 MG tablet take 1 tablet by mouth every 6 hours if needed for MODERATE PAIN 60 tablet 0  . insulin degludec (TRESIBA) 100 UNIT/ML SOPN FlexTouch Pen Inject 18 Units into the skin daily.     Marland Kitchen lidocaine-prilocaine (EMLA) cream Apply to Porta-cath  1-2 hours prior to access as directed. 30 g 1  . metFORMIN (GLUCOPHAGE-XR) 500 MG 24 hr tablet Take 1,000 mg by mouth 2 (two) times daily.   0  . ondansetron (ZOFRAN) 8 MG tablet Take 1 tablet (8 mg total) by mouth every 8 (eight) hours as needed for nausea. 30 tablet 3  . pantoprazole (PROTONIX) 40 MG tablet Take 40 mg by mouth daily.   0  . promethazine (PHENERGAN) 25 MG tablet Take 1 tablet (25 mg total) by mouth every 6 (six) hours as needed for nausea. 30 tablet 3  . rotigotine (NEUPRO) 4 MG/24HR Place 1 patch onto the skin daily. 90 patch 1   No current facility-administered medications for this visit.    Facility-Administered Medications Ordered in Other Visits  Medication Dose Route Frequency Provider Last Rate Last Dose  . heparin lock flush 100 unit/mL  500 Units Intracatheter Once PRN Alvy Bimler, Monalisa Bayless, MD      . sodium chloride flush (NS) 0.9 % injection 10 mL  10 mL Intracatheter PRN Alvy Bimler, Jamileth Putzier, MD        PHYSICAL EXAMINATION: ECOG PERFORMANCE STATUS: 1 - Symptomatic but completely ambulatory  Vitals:   02/20/17 0914  BP: (!) 150/66  Pulse: 70  Resp: 18  Temp: 98.5 F (36.9 C)  SpO2: 100%   Filed Weights   02/20/17 0914  Weight: 191 lb 8 oz (86.9 kg)    GENERAL:alert, no distress and comfortable SKIN: skin color, texture, turgor are normal, no  rashes or significant lesions EYES: normal, Conjunctiva are pink and non-injected, sclera clear OROPHARYNX:no exudate, no erythema and lips, buccal mucosa, and tongue normal  NECK: supple, thyroid normal size, non-tender, without nodularity LYMPH:  no palpable  lymphadenopathy in the cervical, axillary or inguinal LUNGS: clear to auscultation and percussion with normal breathing effort HEART: regular rate & rhythm and no murmurs and no lower extremity edema ABDOMEN:abdomen soft, non-tender and normal bowel sounds Musculoskeletal:no cyanosis of digits and no clubbing  NEURO: alert & oriented x 3 with fluent speech, no focal motor/sensory deficits  LABORATORY DATA:  I have reviewed the data as listed    Component Value Date/Time   NA 141 02/20/2017 0759   NA 142 02/06/2017 0742   K 4.2 02/20/2017 0759   K 3.9 02/06/2017 0742   CL 110 (H) 02/20/2017 0759   CO2 22 02/20/2017 0759   CO2 22 02/06/2017 0742   GLUCOSE 139 02/20/2017 0759   GLUCOSE 156 (H) 02/06/2017 0742   BUN 10 02/20/2017 0759   BUN 13.5 02/06/2017 0742   CREATININE 0.80 02/20/2017 0759   CREATININE 0.8 02/06/2017 0742   CALCIUM 8.6 02/20/2017 0759   CALCIUM 8.5 02/06/2017 0742   PROT 7.2 02/20/2017 0759   PROT 6.9 02/06/2017 0742   ALBUMIN 3.8 02/20/2017 0759   ALBUMIN 3.7 02/06/2017 0742   AST 54 (H) 02/20/2017 0759   AST 47 (H) 02/06/2017 0742   ALT 52 02/20/2017 0759   ALT 54 02/06/2017 0742   ALKPHOS 96 02/20/2017 0759   ALKPHOS 130 02/06/2017 0742   BILITOT 0.8 02/20/2017 0759   BILITOT 0.65 02/06/2017 0742   GFRNONAA >60 02/20/2017 0759   GFRAA >60 02/20/2017 0759    No results found for: SPEP, UPEP  Lab Results  Component Value Date   WBC 3.8 (L) 02/20/2017   NEUTROABS 2.6 02/20/2017   HGB 10.4 (L) 02/20/2017   HCT 33.4 (L) 02/20/2017   MCV 82.1 02/20/2017   PLT 105 (L) 02/20/2017      Chemistry      Component Value Date/Time   NA 141 02/20/2017 0759   NA 142 02/06/2017 0742   K 4.2 02/20/2017 0759   K 3.9 02/06/2017 0742   CL 110 (H) 02/20/2017 0759   CO2 22 02/20/2017 0759   CO2 22 02/06/2017 0742   BUN 10 02/20/2017 0759   BUN 13.5 02/06/2017 0742   CREATININE 0.80 02/20/2017 0759   CREATININE 0.8 02/06/2017 0742      Component Value  Date/Time   CALCIUM 8.6 02/20/2017 0759   CALCIUM 8.5 02/06/2017 0742   ALKPHOS 96 02/20/2017 0759   ALKPHOS 130 02/06/2017 0742   AST 54 (H) 02/20/2017 0759   AST 47 (H) 02/06/2017 0742   ALT 52 02/20/2017 0759   ALT 54 02/06/2017 0742   BILITOT 0.8 02/20/2017 0759   BILITOT 0.65 02/06/2017 0742       RADIOGRAPHIC STUDIES: I have personally reviewed the radiological images as listed and agreed with the findings in the report. Ct Abdomen Pelvis W Contrast  Result Date: 02/18/2017 CLINICAL DATA:  Follow-up ovarian carcinoma with peritoneal carcinomatosis. Status post chemotherapy. Restaging. EXAM: CT ABDOMEN AND PELVIS WITH CONTRAST TECHNIQUE: Multidetector CT imaging of the abdomen and pelvis was performed using the standard protocol following bolus administration of intravenous contrast. CONTRAST:  155m ISOVUE-300 IOPAMIDOL (ISOVUE-300) INJECTION 61% COMPARISON:  11/29/2016 FINDINGS: Lower Chest: No acute findings. Hepatobiliary: Hepatic cirrhosis is again demonstrated. No liver mass identified. Gallbladder is unremarkable. No evidence  of biliary ductal dilatation. Pancreas:  No mass or inflammatory changes. Spleen: Stable splenomegaly consistent with portal venous hypertension. Adrenals/Urinary Tract: No masses identified. No evidence of hydronephrosis. Stomach/Bowel: No evidence of obstruction, inflammatory process or abnormal fluid collections. Sigmoid diverticulosis is again noted, without evidence of diverticulitis. Vascular/Lymphatic: Left external iliac node measuring 9 mm on image 71/2 is stable. 8 mm right lower quadrant mesenteric lymph node measuring 8 mm on image 53/2 is also unchanged. No new or enlarging lymphadenopathy identified. No evidence of abdominal aortic aneurysm. Reproductive: Prior hysterectomy noted. Adnexal regions are unremarkable in appearance. Other: Mild peritoneal thickening in the pelvis and right abdomen along the paracolic gutter are stable. No evidence of  ascites. Musculoskeletal: No suspicious bone lesions identified. Old L3 vertebral body superior endplate compression deformity is unchanged. IMPRESSION: Stable mild peritoneal thickening. No new or progressive disease identified within the abdomen or pelvis. Stable tiny sub-cm left external iliac and mesenteric lymph nodes. Stable hepatic cirrhosis, and splenomegaly consistent with portal venous hypertension. No evidence of hepatic neoplasm. Colonic diverticulosis, without radiographic evidence of diverticulitis. Electronically Signed   By: Earle Gell M.D.   On: 02/18/2017 12:00    ASSESSMENT & PLAN:  Primary peritoneal carcinomatosis ([place]) I have recent reviewed recent CT imaging which showed no evidence of progression of disease Tumor marker is stable She will continue Avastin indefinitely  Essential hypertension Her blood pressure is creeping up This could be related to side effects of Avastin I recommend increasing the dose of bisoprolol and monitor carefully  Iron deficiency anemia She has recurrent iron deficiency anemia She denies signs of bleeding discussed the risk and benefits of repeat EGD and colonoscopy and the patient declined We will proceed with iron replacement therapy only. The most likely cause of her anemia is due to chronic blood loss/malabsorption syndrome. We discussed some of the risks, benefits, and alternatives of intravenous iron infusions. The patient is symptomatic from anemia and the iron level is critically low. She tolerated oral iron supplement poorly and desires to achieved higher levels of iron faster for adequate hematopoesis. Some of the side-effects to be expected including risks of infusion reactions, phlebitis, headaches, nausea and fatigue.  The patient is willing to proceed. Patient education material was dispensed.  Goal is to keep ferritin level greater than 50   Cardiomyopathy ([place]) She has no signs or symptoms of congestive heart failure I plan  to repeat echocardiogram next month  Pancytopenia, acquired ([place]) She has mild anemia likely anemia chronic disease. The leukopenia and thrombocytopenia is likely due to her liver disease/splenomegaly. She is not symptomatic.  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   Orders Placed This Encounter  Procedures  . ECHOCARDIOGRAM COMPLETE    Standing Status:   Future    Standing Expiration Date:   05/22/2018    Order Specific Question:   Where should this test be performed    Answer:   [name]    Order Specific Question:   Complete or Limited study?    Answer:   Complete    Order Specific Question:   With Image Enhancing Agent or without Image Enhancing Agent?    Answer:   With Image Enhancing Agent    Order  Specific Question:   Reason for exam-Echo    Answer:   Cardiomyopathy-Unspecified  425.9 / I42.9   All questions were answered. The patient knows to call the clinic with any problems, questions or concerns. No barriers to learning was detected. I spent 25 minutes counseling the patient face to face. The total time spent in the appointment was 40 minutes and more than 50% was on counseling and review of test results     [name], MD 02/20/2017 11:55 AM

## 2017-02-20 NOTE — Assessment & Plan Note (Signed)
Her blood pressure is creeping up This could be related to side effects of Avastin I recommend increasing the dose of bisoprolol and monitor carefully

## 2017-02-20 NOTE — Patient Instructions (Signed)
[facility] Discharge Instructions for Patients Receiving Chemotherapy  Today you received the following chemotherapy agents: Bevacizumab (Avastin).  To help prevent nausea and vomiting after your treatment, we encourage you to take your nausea medication as prescribed.  If you develop nausea and vomiting that is not controlled by your nausea medication, call the clinic.   BELOW ARE SYMPTOMS THAT SHOULD BE REPORTED IMMEDIATELY:  *FEVER GREATER THAN 100.5 F  *CHILLS WITH OR WITHOUT FEVER  NAUSEA AND VOMITING THAT IS NOT CONTROLLED WITH YOUR NAUSEA MEDICATION  *UNUSUAL SHORTNESS OF BREATH  *UNUSUAL BRUISING OR BLEEDING  TENDERNESS IN MOUTH AND THROAT WITH OR WITHOUT PRESENCE OF ULCERS  *URINARY PROBLEMS  *BOWEL PROBLEMS  UNUSUAL RASH Items with * indicate a potential emergency and should be followed up as soon as possible.  Feel free to call the clinic should you have any questions or concerns. The clinic phone number is [phone].  Please show the [place] at check-in to the Emergency Department and triage nurse.

## 2017-02-20 NOTE — Assessment & Plan Note (Signed)
Has chronic back pain since diagnosis of cancer I refilled her prescription for pain medicine today We discussed narcotic refill policy.

## 2017-02-20 NOTE — Assessment & Plan Note (Signed)
She has baseline mild liver cirrhosis Her liver enzymes are mildly elevated but stable Continue close monitoring

## 2017-02-20 NOTE — Assessment & Plan Note (Signed)
She has no signs or symptoms of congestive heart failure I plan to repeat echocardiogram next month

## 2017-02-20 NOTE — Assessment & Plan Note (Signed)
She has mild anemia likely anemia chronic disease. The leukopenia and thrombocytopenia is likely due to her liver disease/splenomegaly. She is not symptomatic.  Recommend close observation only

## 2017-02-20 NOTE — Assessment & Plan Note (Signed)
I have recent reviewed recent CT imaging which showed no evidence of progression of disease Tumor marker is stable She will continue Avastin indefinitely

## 2017-02-20 NOTE — Telephone Encounter (Signed)
Gave patient AVS and calendar of upcoming January and February appointments.

## 2017-02-20 NOTE — Assessment & Plan Note (Signed)
She has recurrent iron deficiency anemia She denies signs of bleeding discussed the risk and benefits of repeat EGD and colonoscopy and the patient declined We will proceed with iron replacement therapy only. The most likely cause of her anemia is due to chronic blood loss/malabsorption syndrome. We discussed some of the risks, benefits, and alternatives of intravenous iron infusions. The patient is symptomatic from anemia and the iron level is critically low. She tolerated oral iron supplement poorly and desires to achieved higher levels of iron faster for adequate hematopoesis. Some of the side-effects to be expected including risks of infusion reactions, phlebitis, headaches, nausea and fatigue.  The patient is willing to proceed. Patient education material was dispensed.  Goal is to keep ferritin level greater than 50

## 2017-03-06 ENCOUNTER — Inpatient Hospital Stay: Payer: Medicare Other

## 2017-03-06 VITALS — BP 130/64 | HR 63 | Temp 98.7°F | Resp 16

## 2017-03-06 DIAGNOSIS — C482 Malignant neoplasm of peritoneum, unspecified: Secondary | ICD-10-CM

## 2017-03-06 DIAGNOSIS — Z5112 Encounter for antineoplastic immunotherapy: Secondary | ICD-10-CM | POA: Diagnosis not present

## 2017-03-06 DIAGNOSIS — C569 Malignant neoplasm of unspecified ovary: Secondary | ICD-10-CM

## 2017-03-06 LAB — COMPREHENSIVE METABOLIC PANEL
ALT: 58 U/L — ABNORMAL HIGH (ref 0–55)
AST: 55 U/L — ABNORMAL HIGH (ref 5–34)
Albumin: 3.8 g/dL (ref 3.5–5.0)
Alkaline Phosphatase: 117 U/L (ref 40–150)
Anion gap: 12 — ABNORMAL HIGH (ref 3–11)
BUN: 16 mg/dL (ref 7–26)
CO2: 22 mmol/L (ref 22–29)
Calcium: 9.1 mg/dL (ref 8.4–10.4)
Chloride: 108 mmol/L (ref 98–109)
Creatinine, Ser: 0.83 mg/dL (ref 0.60–1.10)
GFR calc Af Amer: >60 mL/min (ref >60)
GFR calc non Af Amer: >60 mL/min (ref >60)
Glucose, Bld: 149 mg/dL — ABNORMAL HIGH (ref 70–140)
Potassium: 4.3 mmol/L (ref 3.3–4.7)
Sodium: 142 mmol/L (ref 136–145)
Total Bilirubin: 0.8 mg/dL (ref 0.2–1.2)
Total Protein: 7.3 g/dL (ref 6.4–8.3)

## 2017-03-06 LAB — CBC WITH DIFFERENTIAL/PLATELET
Basophils Absolute: 0 10*3/uL (ref 0.0–0.1)
Basophils Relative: 0 %
Eosinophils Absolute: 0.1 10*3/uL (ref 0.0–0.5)
Eosinophils Relative: 2 %
HCT: 38 % (ref 34.8–46.6)
Hemoglobin: 11.8 g/dL (ref 11.6–15.9)
Lymphocytes Relative: 28 %
Lymphs Abs: 1.2 10*3/uL (ref 0.9–3.3)
MCH: 27.4 pg (ref 25.1–34.0)
MCHC: 31.1 g/dL — ABNORMAL LOW (ref 31.5–36.0)
MCV: 88.2 fL (ref 79.5–101.0)
Monocytes Absolute: 0.2 10*3/uL (ref 0.1–0.9)
Monocytes Relative: 6 %
Neutro Abs: 2.7 10*3/uL (ref 1.5–6.5)
Neutrophils Relative %: 64 %
Platelets: 110 10*3/uL — ABNORMAL LOW (ref 145–400)
RBC: 4.31 MIL/uL (ref 3.70–5.45)
RDW: 20.9 % — ABNORMAL HIGH (ref 11.2–16.1)
WBC: 4.2 10*3/uL (ref 3.9–10.3)

## 2017-03-06 LAB — UA PROTEIN, DIPSTICK - CHCC: Protein, ur: NEGATIVE mg/dL

## 2017-03-06 MED ORDER — SODIUM CHLORIDE 0.9 % IV SOLN
Freq: Once | INTRAVENOUS | Status: AC
  Administered 2017-03-06: 10:00:00 via INTRAVENOUS

## 2017-03-06 MED ORDER — SODIUM CHLORIDE 0.9% FLUSH
10.0000 mL | INTRAVENOUS | Status: DC | PRN
  Administered 2017-03-06: 10 mL
  Filled 2017-03-06: qty 10

## 2017-03-06 MED ORDER — HEPARIN SOD (PORK) LOCK FLUSH 100 UNIT/ML IV SOLN
500.0000 [IU] | Freq: Once | INTRAVENOUS | Status: AC | PRN
  Administered 2017-03-06: 500 [IU]
  Filled 2017-03-06: qty 5

## 2017-03-06 MED ORDER — SODIUM CHLORIDE 0.9 % IV SOLN
10.2000 mg/kg | Freq: Once | INTRAVENOUS | Status: AC
  Administered 2017-03-06: 900 mg via INTRAVENOUS
  Filled 2017-03-06: qty 4

## 2017-03-06 NOTE — Patient Instructions (Signed)
[facility] Discharge Instructions for Patients Receiving Chemotherapy  Today you received the following chemotherapy agents Avastin  To help prevent nausea and vomiting after your treatment, we encourage you to take your nausea medication as directed   If you develop nausea and vomiting that is not controlled by your nausea medication, call the clinic.   BELOW ARE SYMPTOMS THAT SHOULD BE REPORTED IMMEDIATELY:  *FEVER GREATER THAN 100.5 F  *CHILLS WITH OR WITHOUT FEVER  NAUSEA AND VOMITING THAT IS NOT CONTROLLED WITH YOUR NAUSEA MEDICATION  *UNUSUAL SHORTNESS OF BREATH  *UNUSUAL BRUISING OR BLEEDING  TENDERNESS IN MOUTH AND THROAT WITH OR WITHOUT PRESENCE OF ULCERS  *URINARY PROBLEMS  *BOWEL PROBLEMS  UNUSUAL RASH Items with * indicate a potential emergency and should be followed up as soon as possible.  Feel free to call the clinic should you have any questions or concerns. The clinic phone number is [phone].  Please show the CHEMO ALERT CARD at check-in to the Emergency Department and triage nurse.   

## 2017-03-07 LAB — CA 125: Cancer Antigen (CA) 125: 50.4 U/mL — ABNORMAL HIGH (ref 0.0–38.1)

## 2017-03-12 ENCOUNTER — Telehealth: Payer: Self-pay | Admitting: *Deleted

## 2017-03-12 MED ORDER — BISOPROLOL FUMARATE 5 MG PO TABS: 5.0000 mg | ORAL_TABLET | Freq: Every day | ORAL | 0 refills | Status: DC

## 2017-03-12 NOTE — Telephone Encounter (Signed)
Notified of CA 125 results. Dr Alvy Bimler will fill bisoprolol 5 mg X 1 month. She will follow up with PCP for future refills

## 2017-03-17 ENCOUNTER — Ambulatory Visit (HOSPITAL_COMMUNITY)
Admission: RE | Admit: 2017-03-17 | Discharge: 2017-03-17 | Disposition: A | Discharge status: Home / Self Care | Payer: Medicare Other | Source: Ambulatory Visit | Attending: Hematology and Oncology | Admitting: Hematology and Oncology

## 2017-03-17 DIAGNOSIS — I5030 Unspecified diastolic (congestive) heart failure: Secondary | ICD-10-CM | POA: Insufficient documentation

## 2017-03-17 DIAGNOSIS — I427 Cardiomyopathy due to drug and external agent: Secondary | ICD-10-CM

## 2017-03-17 DIAGNOSIS — I051 Rheumatic mitral insufficiency: Secondary | ICD-10-CM | POA: Diagnosis not present

## 2017-03-17 DIAGNOSIS — I420 Dilated cardiomyopathy: Secondary | ICD-10-CM | POA: Diagnosis not present

## 2017-03-17 LAB — ECHOCARDIOGRAM COMPLETE
Ao-asc: 32 cm
E decel time: 225 msec
E/e' ratio: 14.95
FS: 25 % — AB (ref 28–44)
IVS/LV PW RATIO, ED: 0.9
LA ID, A-P, ES: 42 mm
LA diam end sys: 42 mm
LA diam index: 2.12 cm/m2
LA vol A4C: 68.3 ml
LA vol index: 33.8 mL/m2
LA vol: 67 mL
LV E/e' medial: 14.95
LV E/e'average: 14.95
LV PW d: 10.2 mm — AB (ref 0.6–1.1)
LV e' LATERAL: 7.29 cm/s
LVOT area: 2.54 cm2
LVOT diameter: 18 mm
Lateral S' vel: 11 cm/s
MV Dec: 225
MV Peak grad: 5 mmHg
MV pk A vel: 113 m/s
MV pk E vel: 109 m/s
PV Reg vel dias: 95 cm/s
TAPSE: 18.5 mm
TDI e' lateral: 7.29
TDI e' medial: 5

## 2017-03-17 NOTE — Progress Notes (Signed)
  Echocardiogram 2D Echocardiogram has been performed.  Angelino Rumery G Waylon Koffler 03/17/2017, 11:22 AM

## 2017-03-20 ENCOUNTER — Inpatient Hospital Stay (HOSPITAL_BASED_OUTPATIENT_CLINIC_OR_DEPARTMENT_OTHER): Payer: Medicare Other | Admitting: Hematology and Oncology

## 2017-03-20 ENCOUNTER — Encounter: Payer: Self-pay | Admitting: Hematology and Oncology

## 2017-03-20 ENCOUNTER — Inpatient Hospital Stay: Payer: Medicare Other

## 2017-03-20 ENCOUNTER — Inpatient Hospital Stay: Payer: Medicare Other | Attending: Hematology and Oncology

## 2017-03-20 ENCOUNTER — Telehealth: Payer: Self-pay | Admitting: Hematology and Oncology

## 2017-03-20 VITALS — BP 147/74

## 2017-03-20 DIAGNOSIS — C482 Malignant neoplasm of peritoneum, unspecified: Secondary | ICD-10-CM

## 2017-03-20 DIAGNOSIS — Z5112 Encounter for antineoplastic immunotherapy: Secondary | ICD-10-CM

## 2017-03-20 DIAGNOSIS — I429 Cardiomyopathy, unspecified: Secondary | ICD-10-CM | POA: Diagnosis not present

## 2017-03-20 DIAGNOSIS — D509 Iron deficiency anemia, unspecified: Secondary | ICD-10-CM

## 2017-03-20 DIAGNOSIS — Z6835 Body mass index (BMI) 35.0-35.9, adult: Secondary | ICD-10-CM

## 2017-03-20 DIAGNOSIS — C569 Malignant neoplasm of unspecified ovary: Secondary | ICD-10-CM

## 2017-03-20 DIAGNOSIS — D696 Thrombocytopenia, unspecified: Secondary | ICD-10-CM | POA: Diagnosis not present

## 2017-03-20 DIAGNOSIS — K766 Portal hypertension: Secondary | ICD-10-CM | POA: Diagnosis not present

## 2017-03-20 DIAGNOSIS — R232 Flushing: Secondary | ICD-10-CM | POA: Diagnosis not present

## 2017-03-20 DIAGNOSIS — Z7984 Long term (current) use of oral hypoglycemic drugs: Secondary | ICD-10-CM | POA: Insufficient documentation

## 2017-03-20 DIAGNOSIS — M48061 Spinal stenosis, lumbar region without neurogenic claudication: Secondary | ICD-10-CM | POA: Diagnosis not present

## 2017-03-20 DIAGNOSIS — R0981 Nasal congestion: Secondary | ICD-10-CM | POA: Insufficient documentation

## 2017-03-20 DIAGNOSIS — R161 Splenomegaly, not elsewhere classified: Secondary | ICD-10-CM | POA: Insufficient documentation

## 2017-03-20 DIAGNOSIS — K5730 Diverticulosis of large intestine without perforation or abscess without bleeding: Secondary | ICD-10-CM

## 2017-03-20 DIAGNOSIS — E6609 Other obesity due to excess calories: Secondary | ICD-10-CM | POA: Insufficient documentation

## 2017-03-20 DIAGNOSIS — I10 Essential (primary) hypertension: Secondary | ICD-10-CM | POA: Diagnosis not present

## 2017-03-20 DIAGNOSIS — E669 Obesity, unspecified: Secondary | ICD-10-CM | POA: Insufficient documentation

## 2017-03-20 DIAGNOSIS — Z8719 Personal history of other diseases of the digestive system: Secondary | ICD-10-CM | POA: Diagnosis not present

## 2017-03-20 DIAGNOSIS — C786 Secondary malignant neoplasm of retroperitoneum and peritoneum: Secondary | ICD-10-CM | POA: Diagnosis not present

## 2017-03-20 DIAGNOSIS — Z79899 Other long term (current) drug therapy: Secondary | ICD-10-CM | POA: Diagnosis not present

## 2017-03-20 DIAGNOSIS — Z9221 Personal history of antineoplastic chemotherapy: Secondary | ICD-10-CM | POA: Insufficient documentation

## 2017-03-20 DIAGNOSIS — K7460 Unspecified cirrhosis of liver: Secondary | ICD-10-CM

## 2017-03-20 DIAGNOSIS — Z90710 Acquired absence of both cervix and uterus: Secondary | ICD-10-CM

## 2017-03-20 DIAGNOSIS — I427 Cardiomyopathy due to drug and external agent: Secondary | ICD-10-CM

## 2017-03-20 DIAGNOSIS — Z7982 Long term (current) use of aspirin: Secondary | ICD-10-CM | POA: Insufficient documentation

## 2017-03-20 LAB — CBC WITH DIFFERENTIAL/PLATELET
Basophils Absolute: 0 10*3/uL (ref 0.0–0.1)
Basophils Relative: 0 %
Eosinophils Absolute: 0.1 10*3/uL (ref 0.0–0.5)
Eosinophils Relative: 1 %
HCT: 31.9 % — ABNORMAL LOW (ref 34.8–46.6)
Hemoglobin: 10.2 g/dL — ABNORMAL LOW (ref 11.6–15.9)
Lymphocytes Relative: 23 %
Lymphs Abs: 1.1 10*3/uL (ref 0.9–3.3)
MCH: 28.3 pg (ref 25.1–34.0)
MCHC: 32 g/dL (ref 31.5–36.0)
MCV: 88.3 fL (ref 79.5–101.0)
Monocytes Absolute: 0.2 10*3/uL (ref 0.1–0.9)
Monocytes Relative: 5 %
Neutro Abs: 3.3 10*3/uL (ref 1.5–6.5)
Neutrophils Relative %: 71 %
Platelets: 109 10*3/uL — ABNORMAL LOW (ref 145–400)
RBC: 3.61 MIL/uL — ABNORMAL LOW (ref 3.70–5.45)
RDW: 23.7 % — ABNORMAL HIGH (ref 11.2–14.5)
WBC: 4.7 10*3/uL (ref 3.9–10.3)

## 2017-03-20 LAB — COMPREHENSIVE METABOLIC PANEL
ALT: 46 U/L (ref 0–55)
AST: 41 U/L — ABNORMAL HIGH (ref 5–34)
Albumin: 3.3 g/dL — ABNORMAL LOW (ref 3.5–5.0)
Alkaline Phosphatase: 92 U/L (ref 40–150)
Anion gap: 9 (ref 3–11)
BUN: 28 mg/dL — ABNORMAL HIGH (ref 7–26)
CO2: 22 mmol/L (ref 22–29)
Calcium: 9 mg/dL (ref 8.4–10.4)
Chloride: 111 mmol/L — ABNORMAL HIGH (ref 98–109)
Creatinine, Ser: 0.73 mg/dL (ref 0.60–1.10)
GFR calc Af Amer: >60 mL/min (ref >60)
GFR calc non Af Amer: >60 mL/min (ref >60)
Glucose, Bld: 135 mg/dL (ref 70–140)
Potassium: 4.7 mmol/L (ref 3.5–5.1)
Sodium: 142 mmol/L (ref 136–145)
Total Bilirubin: 0.7 mg/dL (ref 0.2–1.2)
Total Protein: 6.3 g/dL — ABNORMAL LOW (ref 6.4–8.3)

## 2017-03-20 MED ORDER — BISOPROLOL FUMARATE 10 MG PO TABS: 10.0000 mg | ORAL_TABLET | Freq: Every day | ORAL | 11 refills | Status: DC

## 2017-03-20 MED ORDER — AMLODIPINE BESYLATE 10 MG PO TABS: 10.0000 mg | ORAL_TABLET | Freq: Every day | ORAL | 11 refills | Status: DC

## 2017-03-20 MED ORDER — SODIUM CHLORIDE 0.9% FLUSH
10.0000 mL | INTRAVENOUS | Status: DC | PRN
  Administered 2017-03-20: 10 mL
  Filled 2017-03-20: qty 10

## 2017-03-20 MED ORDER — SODIUM CHLORIDE 0.9 % IV SOLN
Freq: Once | INTRAVENOUS | Status: AC
  Administered 2017-03-20: 10:00:00 via INTRAVENOUS

## 2017-03-20 MED ORDER — SODIUM CHLORIDE 0.9 % IV SOLN
10.2000 mg/kg | Freq: Once | INTRAVENOUS | Status: AC
  Administered 2017-03-20: 900 mg via INTRAVENOUS
  Filled 2017-03-20: qty 32

## 2017-03-20 MED ORDER — SODIUM CHLORIDE 0.9% FLUSH
10.0000 mL | Freq: Once | INTRAVENOUS | Status: AC
  Administered 2017-03-20: 10 mL
  Filled 2017-03-20: qty 10

## 2017-03-20 MED ORDER — HEPARIN SOD (PORK) LOCK FLUSH 100 UNIT/ML IV SOLN
500.0000 [IU] | Freq: Once | INTRAVENOUS | Status: AC | PRN
  Administered 2017-03-20: 500 [IU]
  Filled 2017-03-20: qty 5

## 2017-03-20 MED ORDER — SODIUM CHLORIDE 0.9 % IV SOLN: Freq: Once | INTRAVENOUS | Status: AC

## 2017-03-20 NOTE — Assessment & Plan Note (Signed)
Recent echocardiogram showed improvement of ejection fraction I recommend risk factor modification The patient is obese She has poorly controlled hypertension We have discussed extensively regarding medication changes to control her blood pressure better and I recommend lifestyle modification and attempt to lose weight

## 2017-03-20 NOTE — Patient Instructions (Signed)
[facility] Discharge Instructions for Patients Receiving Chemotherapy  Today you received the following chemotherapy agents bevacizumab (Avastin).  To help prevent nausea and vomiting after your treatment, we encourage you to take your nausea medication as directed. If you develop nausea and vomiting that is not controlled by your nausea medication, call the clinic.   BELOW ARE SYMPTOMS THAT SHOULD BE REPORTED IMMEDIATELY:  *FEVER GREATER THAN 100.5 F  *CHILLS WITH OR WITHOUT FEVER  NAUSEA AND VOMITING THAT IS NOT CONTROLLED WITH YOUR NAUSEA MEDICATION  *UNUSUAL SHORTNESS OF BREATH  *UNUSUAL BRUISING OR BLEEDING  TENDERNESS IN MOUTH AND THROAT WITH OR WITHOUT PRESENCE OF ULCERS  *URINARY PROBLEMS  *BOWEL PROBLEMS  UNUSUAL RASH Items with * indicate a potential emergency and should be followed up as soon as possible.  Feel free to call the clinic should you have any questions or concerns. The clinic phone number is [phone].  Please show the [place] at check-in to the Emergency Department and triage nurse.

## 2017-03-20 NOTE — Assessment & Plan Note (Signed)
Her blood pressure is high, could be induced by a Avastin I recommend increasing bisoprolol to 10 mg to be taken in the morning and amlodipine dose to be increased to 10 mg to be taken in the evening I will reassess blood pressure control next month

## 2017-03-20 NOTE — Assessment & Plan Note (Signed)
She has chronic sinus congestion but clinically, she has no active infection Continue over-the-counter remedies No new antibiotics

## 2017-03-20 NOTE — Assessment & Plan Note (Signed)
I have recent reviewed recent CT imaging which showed no evidence of progression of disease Tumor marker is stable She will continue Avastin indefinitely

## 2017-03-20 NOTE — Assessment & Plan Note (Signed)
The patient is obese She has cardiovascular risk factors I recommend blood pressure medication adjustment as above I recommend lifestyle modification and to increase goals of daily steps to 8000/day I also recommend resource for the patient to read and recommend reducing carbohydrate intake and processed food.  We will reassess her goals next month

## 2017-03-20 NOTE — Telephone Encounter (Signed)
Gave patient AVS and calendar of upcoming February and March appointments.

## 2017-03-20 NOTE — Assessment & Plan Note (Signed)
She had recurrent iron deficiency anemia She received intravenous iron infusion last month I plan to recheck iron studies in her next lab draw

## 2017-03-20 NOTE — Assessment & Plan Note (Signed)
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 She is not symptomatic.  Recommend close observation only

## 2017-03-20 NOTE — Progress Notes (Signed)
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  No worrisome liver mass.  There are associated portal venous collaterals and splenomegaly consistent with portal venous hypertension. 2.  No other significant upper abdominal findings      11/08/2013 Imaging    US showed ascites      11/08/2013 Initial Diagnosis    Patient had ~ 2 months of some urinary incontinence, saw Dr Ronita Hipps in 10-2013, with pelvic mass on exam       11/12/2013 Imaging    There are findings of progressive hepatic cirrhosis with a large amount of ascites and mild splenomegaly. There are venous collaterals present. 2. There is abnormal thickening of the peritoneal surface along the left mid and lower abdominal wall. There is abnormal soft tissue density in the pelvis as well which could reflect the clinically suspected ovarian malignancy but the findings are nondiagnostic. 3. There is a new small left pleural effusion. 4. There is no acute bowel abnormality.      11/18/2013 Imaging    Successful ultrasound guided diagnostic and therapeutic paracentesis yielding 4.1 liters of ascites.       11/18/2013 Pathology Results    PERITONEAL/ASCITIC FLUID (SPECIMEN 1 OF 1 COLLECTED 11/18/13): SEROUS CARCINOMA, PLEASE SEE COMMENT.      12/01/2013 Tumor Marker    Patient's tumor was tested for the following markers: CA125 Results of the tumor marker test revealed 1938      12/03/2013 Imaging    Successful ultrasound-guided therapeutic paracentesis yielding 3.6 liters of peritoneal fluid.      12/07/2013 - 05/30/2014 Chemotherapy    She received 3 cycles of carboplatin and Taxol, interrupted cycle 4 for surgery.  She  subsequently completed 3 more cycles of chemotherapy after surgery      12/20/2013 Imaging    Successful ultrasound-guided diagnostic and therapeutic paracentesis yielding 2.2 liters of peritoneal fluid      12/20/2013 Pathology Results    PERITONEAL/ASCITIC FLUID(SPECIMEN 1 OF 1 COLLECTED 12/20/13): MALIGNANT CELLS CONSISTENT WITH SEROUS CARCINOMA      01/13/2014 Tumor Marker    Patient's tumor was tested for the following markers: CA125 Results of the tumor marker test revealed 227      02/07/2014 Tumor Marker    Patient's tumor was tested for the following markers: CA125 Results of the tumor marker test revealed 130      02/15/2014 Genetic Testing    Genetics testing from 02-2014 normal ([employer] panel)      02/23/2014 Imaging    Interval decrease in ascites. 2. Morphologic changes in the liver consistent with cirrhosis. 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 Portal vein remains patent at this time. 3. Persistent but decreased abnormal soft tissue attenuation tracking in the omental fat. This may be secondary to interval improvement in metastatic disease. 4. Peritoneal thickening seen in the left abdomen and pelvis on the previous study has decreased and nearly resolved in the interval. This also suggests interval improvement in metastatic disease.       03/07/2014 Procedure    Ultrasound and fluoroscopically guided right internal jugular single lumen power port catheter insertion. Tip in the SVC/RA junction. Catheter ready for use.      03/17/2014  Tumor Marker    Patient's tumor was tested for the following markers: CA125 Results of the tumor marker test revealed 45      03/29/2014 Pathology Results    1. Omentum, resection for tumor - HIGH GRADE SEROUS CARCINOMA, SEE COMMENT. 2. Ovary and fallopian tube, right - HIGH GRADE SEROUS CARCINOMA, SEE COMMENT. 3. Ovary and fallopian tube, left - HIGH GRADE SEROUS CARCINOMA, SEE  COMMENT. Diagnosis Note 1. Nests and clusters of malignant cells are invading the omental tissue (part #1) with associated fibrosis. The cells are pleomorphic with prominent nucleoli. There are scattered psammoma bodies. The ovaries are atrophic and exhibit multiple foci of surface based invasive carcinoma and associated fibrosis. The fallopian tubes have a few foci of carcinoma, also superficially located. There are no precursor lesions noted in the ovaries or fallopian tubes (the entire tubes and ovaries were submitted for evaluation). While there is some retraction artifact, there are several foci suspicious for lymphovascular invasion. 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 Given the fibrosis, there does appear to be a small amount of treatment effect, however, there is abundant residual tumor.       03/29/2014 Surgery    Procedure(s) Performed: 1. Exploratory laparotomy with bilateral salpingo-oophorectomy, omentectomy radical tumor debulking for ovarian cancer .  Surgeon: Thereasa Solo, MD.  Assistant Surgeon: Lahoma Crocker, M.D. Assistant: (an MD assistant was necessary for tissue manipulation, retraction and positioning due to the complexity of the case and hospital policies).  Operative Findings: 10cm omental cake from hepatic to splenic flexure, densely adherent to transverse colon. Milliary studding of tumor implants (<65m, too numerous in number to count) adherent to the mesentery of the small bowel and small bowel wall. Small volume (100cc) ascites. Small ovaries bilaterally, densely adherent to the pelvic cul de sac. Left ovary and tube densely adherent to sigmoid colon. Cirrhotic liver with hepatomegaly and splenomegaly.     This represented an optimal cytoreduction (R1) with visible disease residual on the bowel wall and mesentery (millial, <351m implants).        04/18/2014 Tumor Marker    Patient's tumor was tested for the following markers: CA125 Results of the tumor marker test revealed 122      05/16/2014 Tumor Marker    Patient's tumor was tested for the following markers: CA125 Results of the tumor marker test revealed 30      06/10/2014 Tumor Marker    Patient's tumor was tested for the following markers: CA125 Results of the tumor marker test revealed 19      07/04/2014 Imaging    Interval improvement in the appearance of peritoneal metastasis secondary to ovarian cancer. 2. 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       08/18/2014 Tumor Marker    Patient's tumor was tested for the following markers: CA125 Results of the tumor marker test revealed 16      10/13/2014 Tumor Marker    Patient's tumor was tested for the following markers: CA125 Results of the tumor marker test revealed 14      11/21/2014 Tumor Marker    Patient's tumor was tested for the following markers: CA125 Results of the tumor marker test revealed 16      12/28/2014 Tumor Marker    Patient's tumor was tested for the following markers: CA125 Results of the tumor marker test revealed 762  02/27/2015 Tumor Marker    Patient's tumor was tested for the following markers: CA125 Results of the tumor marker test revealed 20.2      03/22/2015 Imaging    Filler appearance to the prior exam, with very subtle fluid tracking along portions of the liver, and very minimal nodularity along the right paracolic gutter representing residua from the prior peritoneal cancer. The current abnormalities could simply be post therapy findings rather than necessarily representing residual malignancy. No new or enlarging lesions are identified. 2. Hepatic cirrhosis and splenomegaly. There is some gastric varices suggesting portal venous hypertension. 3. Left foraminal stenosis at L4-5 due to spurring. There is likely also some central narrowing  of the thecal sac at this level. 4. Bibasilar scarring. 5. Chronic mild left mid kidney scarring      03/27/2015 Tumor Marker    Patient's tumor was tested for the following markers: CA125 Results of the tumor marker test revealed 25.3      04/24/2015 Imaging    Disc bulge L2-3 with mild spinal stenosis and right foraminal encroachment. 2. Disc bulge L3-4 with mild spinal stenosis. 3. Multifactorial spinal, left lateral recess and foraminal stenosis L4-5. There is grade 1 anterolisthesis without evident dynamic instability.      05/29/2015 Tumor Marker    Patient's tumor was tested for the following markers: CA125 Results of the tumor marker test revealed 29.9      08/21/2015 Tumor Marker    Patient's tumor was tested for the following markers: CA125 Results of the tumor marker test revealed 45      09/18/2015 Tumor Marker    Patient's tumor was tested for the following markers: CA125 Results of the tumor marker test revealed 47      09/27/2015 Imaging    New small bowel mesenteric nodule and enlarged left external iliac lymph node, highly worrisome for metastatic disease. 2. Cirrhosis and splenomegaly      10/10/2015 Tumor Marker    Patient's tumor was tested for the following markers: CA125 Results of the tumor marker test revealed 53.4      10/10/2015 - 12/11/2015 Chemotherapy    She received 4 cycles of carboplatin only      11/20/2015 Tumor Marker    Patient's tumor was tested for the following markers: CA125 Results of the tumor marker test revealed 41.3      12/20/2015 Imaging    CT: Mild left external iliac lymphadenopathy is slightly decreased. Mildly enlarged lower mesenteric nodule is slightly decreased. 2. No new or progressive metastatic disease in the abdomen or pelvis. 3. Cirrhosis. Stable subcentimeter hypodense left liver lobe lesion. No new liver lesions. 4. Stable mild splenomegaly.  No ascites. 5. Mild sigmoid diverticulosis.      01/01/2016 -  Anti-estrogen  oral therapy    She was placed on tamoxifen. Had letrozole initially but switched to tamoxifen due to poor tolerance      01/15/2016 Tumor Marker    Patient's tumor was tested for the following markers: CA125 Results of the tumor marker test revealed 31.4      02/19/2016 Tumor Marker    Patient's tumor was tested for the following markers: CA125 Results of the tumor marker test revealed 36.5      05/13/2016 Imaging    No evidence of disease progression within the abdomen or pelvis. Previously noted small central mesenteric nodule and left external iliac node appear slightly smaller. 2. Stable changes of hepatic cirrhosis and portal hypertension with associated splenomegaly. No new or  enlarging hepatic lesions are identified. 3. No acute findings.      05/13/2016 Tumor Marker    Patient's tumor was tested for the following markers: CA125 Results of the tumor marker test revealed 41.2      06/24/2016 Tumor Marker    Patient's tumor was tested for the following markers: CA125 Results of the tumor marker test revealed 47.3      08/20/2016 Imaging    1. The left external iliac lymph node is slightly increased in size compared to the prior exam, currently 1.1 cm and previously 0.9 cm in diameter. 2. Stable central mesenteric lymph node at 0.9 cm in diameter. 3. Stable trace free pelvic fluid and stable slight thickening along the right paracolic gutter, without well-defined peritoneal nodularity. 4. Other imaging findings of potential clinical significance: Subsegmental atelectasis or scarring in the lung bases. Hepatic cirrhosis. Left renal scarring. Mild splenomegaly. Sigmoid colon diverticulosis. Impingement at L4-5 due to spondylosis and degenerative disc disease broad Schmorl' s nodes at L3-L4. Pelvic floor laxity      08/23/2016 Tumor Marker    Patient's tumor was tested for the following markers: CA125 Results of the tumor marker test revealed 80.2      09/04/2016 Imaging    LV EF: 60%  -   65%      09/12/2016 Tumor Marker    Patient's tumor was tested for the following markers: CA125 Results of the tumor marker test revealed 98.5      09/12/2016 -  Chemotherapy    The patient received Doxil and Avastin.       09/26/2016 Tumor Marker    Patient's tumor was tested for the following markers: CA125 Results of the tumor marker test revealed 68.9      10/10/2016 Tumor Marker    Patient's tumor was tested for the following markers: CA125 Results of the tumor marker test revealed 65.6      11/07/2016 Tumor Marker    Patient's tumor was tested for the following markers: CA125 Results of the tumor marker test revealed 46.4      11/29/2016 Imaging    Stable mild peritoneal thickening, suspicious for peritoneal carcinomatosis. No new or progressive disease identified. No evidence of ascites.  No significant change in 10 mm left external iliac and 8 mm mesenteric lymph nodes.  Stable hepatic cirrhosis, and splenomegaly consistent with portal venous hypertension.  Colonic diverticulosis, without radiographic evidence of diverticulitis.      12/02/2016 Imaging    Normal LV size with EF 55%. Basal inferior and basal inferoseptal hypokinesis. Normal RV size and systolic function. No significant valvular abnormalities.      12/05/2016 Tumor Marker    Patient's tumor was tested for the following markers: CA125 Results of the tumor marker test revealed 49.1      01/09/2017 Tumor Marker    Patient's tumor was tested for the following markers: CA125 Results of the tumor marker test revealed 47.2      02/06/2017 Tumor Marker    Patient's tumor was tested for the following markers: CA125 Results of the tumor marker test revealed 44.1      02/18/2017 Imaging    Stable mild peritoneal thickening. No new or progressive disease identified within the abdomen or pelvis.  Stable tiny sub-cm left external iliac and mesenteric lymph nodes.  Stable hepatic cirrhosis, and  splenomegaly consistent with portal venous hypertension. No evidence of hepatic neoplasm.  Colonic diverticulosis, without radiographic evidence of diverticulitis.      03/06/2017 Tumor  Marker    Patient's tumor was tested for the following markers: CA125 Results of the tumor marker test revealed 50.4      03/17/2017 Imaging    - Left ventricle: The cavity size was normal. Wall thickness was normal. Systolic function was normal. The estimated ejection fraction was in the range of 55% to 60%. Wall motion was normal; there were no regional wall motion abnormalities. Features are consistent with a pseudonormal left ventricular filling pattern, with concomitant abnormal relaxation and increased filling pressure (grade 2 diastolic dysfunction). - Mitral valve: There was mild regurgitation. - Left atrium: The atrium was mildly dilated       INTERVAL HISTORY: Please see below for problem oriented charting. She complained of sinus congestion but no high fever greater than 101 She deniescough She continues to have intermittent hot flashes She denies headache or blurriness of vision with hypertension She documented her blood pressure over the last month which run consistently high in the 332R and 518A systolic blood pressure She has occasional bloody nose but denies other forms of bleeding She denies nausea, vomiting or changes in bowel habits.  REVIEW OF SYSTEMS:   Constitutional: Denies fevers, chills or abnormal weight loss Eyes: Denies blurriness of vision Ears, nose, mouth, throat, and face: Denies mucositis or sore throat Respiratory: Denies cough, dyspnea or wheezes Cardiovascular: Denies palpitation, chest discomfort or lower extremity swelling Gastrointestinal:  Denies nausea, heartburn or change in bowel habits Skin: Denies abnormal skin rashes Lymphatics: Denies new lymphadenopathy or easy bruising Neurological:Denies numbness, tingling or new weaknesses Behavioral/Psych: Mood is  stable, no new changes  All other systems were reviewed with the patient and are negative.  I have reviewed the past medical history, past surgical history, social history and family history with the patient and they are unchanged from previous note.  ALLERGIES:  is allergic to shellfish allergy; benadryl [diphenhydramine hcl]; penicillins; and tape.  MEDICATIONS:  Current Outpatient Medications  Medication Sig Dispense Refill  . amLODipine (NORVASC) 10 MG tablet Take 1 tablet (10 mg total) by mouth daily. 30 tablet 11  . aspirin EC 81 MG tablet Take 81 mg by mouth daily.    . bisoprolol (ZEBETA) 10 MG tablet Take 1 tablet (10 mg total) by mouth daily. 30 tablet 11  . cholecalciferol (VITAMIN D) 1000 units tablet Take 1,000 Units by mouth daily.    Marland Kitchen gabapentin (NEURONTIN) 300 MG capsule Take 2 capsules (600 mg total) by mouth 3 (three) times daily. 120 capsule 3  . HYDROcodone-acetaminophen (NORCO/VICODIN) 5-325 MG tablet take 1 tablet by mouth every 6 hours if needed for MODERATE PAIN 60 tablet 0  . insulin degludec (TRESIBA) 100 UNIT/ML SOPN FlexTouch Pen Inject 18 Units into the skin daily.     Marland Kitchen lidocaine-prilocaine (EMLA) cream Apply to Porta-cath  1-2 hours prior to access as directed. 30 g 1  . metFORMIN (GLUCOPHAGE-XR) 500 MG 24 hr tablet Take 1,000 mg by mouth 2 (two) times daily.   0  . ondansetron (ZOFRAN) 8 MG tablet Take 1 tablet (8 mg total) by mouth every 8 (eight) hours as needed for nausea. 30 tablet 3  . pantoprazole (PROTONIX) 40 MG tablet Take 40 mg by mouth daily.   0  . promethazine (PHENERGAN) 25 MG tablet Take 1 tablet (25 mg total) by mouth every 6 (six) hours as needed for nausea. 30 tablet 3  . rotigotine (NEUPRO) 4 MG/24HR Place 1 patch onto the skin daily. 90 patch 1   No current facility-administered  medications for this visit.    Facility-Administered Medications Ordered in Other Visits  Medication Dose Route Frequency Provider Last Rate Last Dose  . 0.9 %   sodium chloride infusion   Intravenous Once Alvy Bimler, Annalysia Willenbring, MD      . bevacizumab (AVASTIN) 875 mg in sodium chloride 0.9 % 100 mL chemo infusion  10 mg/kg (Treatment Plan Recorded) Intravenous Once Alvy Bimler, Trinika Cortese, MD      . heparin lock flush 100 unit/mL  500 Units Intracatheter Once PRN Alvy Bimler, Navjot Loera, MD      . sodium chloride flush (NS) 0.9 % injection 10 mL  10 mL Intracatheter PRN Alvy Bimler, Neely Cecena, MD        PHYSICAL EXAMINATION: ECOG PERFORMANCE STATUS: 1 - Symptomatic but completely ambulatory  Vitals:   03/20/17 0823  BP: (!) 150/72  Pulse: 78  Resp: 18  Temp: 98.3 F (36.8 C)  SpO2: 100%   Filed Weights   03/20/17 0823  Weight: 190 lb 6.4 oz (86.4 kg)    GENERAL:alert, no distress and comfortable SKIN: skin color, texture, turgor are normal, no rashes or significant lesions EYES: normal, Conjunctiva are pink and non-injected, sclera clear OROPHARYNX:no exudate, no erythema and lips, buccal mucosa, and tongue normal  NECK: supple, thyroid normal size, non-tender, without nodularity LYMPH:  no palpable lymphadenopathy in the cervical, axillary or inguinal LUNGS: clear to auscultation and percussion with normal breathing effort HEART: regular rate & rhythm and no murmurs and no lower extremity edema ABDOMEN:abdomen soft, non-tender and normal bowel sounds Musculoskeletal:no cyanosis of digits and no clubbing  NEURO: alert & oriented x 3 with fluent speech, no focal motor/sensory deficits  LABORATORY DATA:  I have reviewed the data as listed    Component Value Date/Time   NA 142 03/20/2017 0745   NA 142 02/06/2017 0742   K 4.7 03/20/2017 0745   K 3.9 02/06/2017 0742   CL 111 (H) 03/20/2017 0745   CO2 22 03/20/2017 0745   CO2 22 02/06/2017 0742   GLUCOSE 135 03/20/2017 0745   GLUCOSE 156 (H) 02/06/2017 0742   BUN 28 (H) 03/20/2017 0745   BUN 13.5 02/06/2017 0742   CREATININE 0.73 03/20/2017 0745   CREATININE 0.8 02/06/2017 0742   CALCIUM 9.0 03/20/2017 0745   CALCIUM 8.5  02/06/2017 0742   PROT 6.3 (L) 03/20/2017 0745   PROT 6.9 02/06/2017 0742   ALBUMIN 3.3 (L) 03/20/2017 0745   ALBUMIN 3.7 02/06/2017 0742   AST 41 (H) 03/20/2017 0745   AST 47 (H) 02/06/2017 0742   ALT 46 03/20/2017 0745   ALT 54 02/06/2017 0742   ALKPHOS 92 03/20/2017 0745   ALKPHOS 130 02/06/2017 0742   BILITOT 0.7 03/20/2017 0745   BILITOT 0.65 02/06/2017 0742   GFRNONAA >60 03/20/2017 0745   GFRAA >60 03/20/2017 0745    No results found for: SPEP, UPEP  Lab Results  Component Value Date   WBC 4.7 03/20/2017   NEUTROABS 3.3 03/20/2017   HGB 10.2 (L) 03/20/2017   HCT 31.9 (L) 03/20/2017   MCV 88.3 03/20/2017   PLT 109 (L) 03/20/2017      Chemistry      Component Value Date/Time   NA 142 03/20/2017 0745   NA 142 02/06/2017 0742   K 4.7 03/20/2017 0745   K 3.9 02/06/2017 0742   CL 111 (H) 03/20/2017 0745   CO2 22 03/20/2017 0745   CO2 22 02/06/2017 0742   BUN 28 (H) 03/20/2017 0745   BUN 13.5 02/06/2017 0742  CREATININE 0.73 03/20/2017 0745   CREATININE 0.8 02/06/2017 0742      Component Value Date/Time   CALCIUM 9.0 03/20/2017 0745   CALCIUM 8.5 02/06/2017 0742   ALKPHOS 92 03/20/2017 0745   ALKPHOS 130 02/06/2017 0742   AST 41 (H) 03/20/2017 0745   AST 47 (H) 02/06/2017 0742   ALT 46 03/20/2017 0745   ALT 54 02/06/2017 0742   BILITOT 0.7 03/20/2017 0745   BILITOT 0.65 02/06/2017 0742       RADIOGRAPHIC STUDIES: I have personally reviewed the radiological images as listed and agreed with the findings in the report. Ct Abdomen Pelvis W Contrast  Result Date: 02/18/2017 CLINICAL DATA:  Follow-up ovarian carcinoma with peritoneal carcinomatosis. Status post chemotherapy. Restaging. EXAM: CT ABDOMEN AND PELVIS WITH CONTRAST TECHNIQUE: Multidetector CT imaging of the abdomen and pelvis was performed using the standard protocol following bolus administration of intravenous contrast. CONTRAST:  165m ISOVUE-300 IOPAMIDOL (ISOVUE-300) INJECTION 61% COMPARISON:   11/29/2016 FINDINGS: Lower Chest: No acute findings. Hepatobiliary: Hepatic cirrhosis is again demonstrated. No liver mass identified. Gallbladder is unremarkable. No evidence of biliary ductal dilatation. Pancreas:  No mass or inflammatory changes. Spleen: Stable splenomegaly consistent with portal venous hypertension. Adrenals/Urinary Tract: No masses identified. No evidence of hydronephrosis. Stomach/Bowel: No evidence of obstruction, inflammatory process or abnormal fluid collections. Sigmoid diverticulosis is again noted, without evidence of diverticulitis. Vascular/Lymphatic: Left external iliac node measuring 9 mm on image 71/2 is stable. 8 mm right lower quadrant mesenteric lymph node measuring 8 mm on image 53/2 is also unchanged. No new or enlarging lymphadenopathy identified. No evidence of abdominal aortic aneurysm. Reproductive: Prior hysterectomy noted. Adnexal regions are unremarkable in appearance. Other: Mild peritoneal thickening in the pelvis and right abdomen along the paracolic gutter are stable. No evidence of ascites. Musculoskeletal: No suspicious bone lesions identified. Old L3 vertebral body superior endplate compression deformity is unchanged. IMPRESSION: Stable mild peritoneal thickening. No new or progressive disease identified within the abdomen or pelvis. Stable tiny sub-cm left external iliac and mesenteric lymph nodes. Stable hepatic cirrhosis, and splenomegaly consistent with portal venous hypertension. No evidence of hepatic neoplasm. Colonic diverticulosis, without radiographic evidence of diverticulitis. Electronically Signed   By: JEarle GellM.D.   On: 02/18/2017 12:00    ASSESSMENT & PLAN:  Primary peritoneal carcinomatosis ([place] I have recent reviewed recent CT imaging which showed no evidence of progression of disease Tumor marker is stable She will continue Avastin indefinitely  Cardiomyopathy ([place] Recent echocardiogram showed improvement of ejection fraction I  recommend risk factor modification The patient is obese She has poorly controlled hypertension We have discussed extensively regarding medication changes to control her blood pressure better and I recommend lifestyle modification and attempt to lose weight  Essential hypertension Her blood pressure is high, could be induced by a Avastin I recommend increasing bisoprolol to 10 mg to be taken in the morning and amlodipine dose to be increased to 10 mg to be taken in the evening I will reassess blood pressure control next month  Iron deficiency anemia She had recurrent iron deficiency anemia She received intravenous iron infusion last month I plan to recheck iron studies in her next lab draw  Thrombocytopenia (HCC) The intermittent leukopenia 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 She is not symptomatic.  Recommend close observation only  Sinus congestion She has chronic sinus congestion but clinically, she has no active infection Continue over-the-counter remedies No new antibiotics  Obesity due to excess calories The  patient is obese She has cardiovascular risk factors I recommend blood pressure medication adjustment as above I recommend lifestyle modification and to increase goals of daily steps to 8000/day I also recommend resource for the patient to read and recommend reducing carbohydrate intake and processed food.  We will reassess her goals next month   Orders Placed This Encounter  Procedures  . Iron and TIBC    Standing Status:   Future    Standing Expiration Date:   04/24/2018  . Ferritin    Standing Status:   Future    Standing Expiration Date:   04/24/2018   All questions were answered. The patient knows to call the clinic with any problems, questions or concerns. No barriers to learning was detected. I spent 25 minutes counseling the patient face to face. The total time spent in the appointment was 40 minutes and more than 50%  was on counseling and review of test results     [name], MD 03/20/2017 10:02 AM

## 2017-03-24 ENCOUNTER — Other Ambulatory Visit (HOSPITAL_COMMUNITY): Payer: Medicare Other

## 2017-04-03 ENCOUNTER — Inpatient Hospital Stay: Payer: Medicare Other

## 2017-04-03 ENCOUNTER — Other Ambulatory Visit: Payer: Medicare Other

## 2017-04-03 ENCOUNTER — Telehealth: Payer: Self-pay | Admitting: Hematology and Oncology

## 2017-04-03 ENCOUNTER — Other Ambulatory Visit: Payer: Self-pay | Admitting: Hematology and Oncology

## 2017-04-03 VITALS — BP 146/67 | HR 66 | Temp 98.1°F | Resp 16

## 2017-04-03 DIAGNOSIS — C569 Malignant neoplasm of unspecified ovary: Secondary | ICD-10-CM

## 2017-04-03 DIAGNOSIS — C482 Malignant neoplasm of peritoneum, unspecified: Secondary | ICD-10-CM

## 2017-04-03 DIAGNOSIS — D509 Iron deficiency anemia, unspecified: Secondary | ICD-10-CM

## 2017-04-03 DIAGNOSIS — Z5112 Encounter for antineoplastic immunotherapy: Secondary | ICD-10-CM | POA: Diagnosis not present

## 2017-04-03 LAB — COMPREHENSIVE METABOLIC PANEL
ALT: 37 U/L (ref 0–55)
AST: 34 U/L (ref 5–34)
Albumin: 3.6 g/dL (ref 3.5–5.0)
Alkaline Phosphatase: 94 U/L (ref 40–150)
Anion gap: 11 (ref 3–11)
BUN: 15 mg/dL (ref 7–26)
CO2: 22 mmol/L (ref 22–29)
Calcium: 9 mg/dL (ref 8.4–10.4)
Chloride: 110 mmol/L — ABNORMAL HIGH (ref 98–109)
Creatinine, Ser: 0.76 mg/dL (ref 0.60–1.10)
GFR calc Af Amer: >60 mL/min (ref >60)
GFR calc non Af Amer: >60 mL/min (ref >60)
Glucose, Bld: 113 mg/dL (ref 70–140)
Potassium: 3.9 mmol/L (ref 3.5–5.1)
Sodium: 143 mmol/L (ref 136–145)
Total Bilirubin: 0.8 mg/dL (ref 0.2–1.2)
Total Protein: 6.7 g/dL (ref 6.4–8.3)

## 2017-04-03 LAB — CBC WITH DIFFERENTIAL/PLATELET
Basophils Absolute: 0 10*3/uL (ref 0.0–0.1)
Basophils Relative: 1 %
Eosinophils Absolute: 0.1 10*3/uL (ref 0.0–0.5)
Eosinophils Relative: 2 %
HCT: 27.9 % — ABNORMAL LOW (ref 34.8–46.6)
Hemoglobin: 8.9 g/dL — ABNORMAL LOW (ref 11.6–15.9)
Lymphocytes Relative: 29 %
Lymphs Abs: 1 10*3/uL (ref 0.9–3.3)
MCH: 27.4 pg (ref 25.1–34.0)
MCHC: 31.9 g/dL (ref 31.5–36.0)
MCV: 85.9 fL (ref 79.5–101.0)
Monocytes Absolute: 0.2 10*3/uL (ref 0.1–0.9)
Monocytes Relative: 5 %
Neutro Abs: 2.2 10*3/uL (ref 1.5–6.5)
Neutrophils Relative %: 63 %
Platelets: 91 10*3/uL — ABNORMAL LOW (ref 145–400)
RBC: 3.24 MIL/uL — ABNORMAL LOW (ref 3.70–5.45)
RDW: 21.7 % — ABNORMAL HIGH (ref 11.2–14.5)
WBC: 3.5 10*3/uL — ABNORMAL LOW (ref 3.9–10.3)

## 2017-04-03 LAB — FERRITIN: Ferritin: 19 ng/mL (ref 9–269)

## 2017-04-03 LAB — TOTAL PROTEIN, URINE DIPSTICK: Protein, ur: NEGATIVE mg/dL

## 2017-04-03 LAB — IRON AND TIBC
Iron: 41 ug/dL (ref 41–142)
Saturation Ratios: 10 % — ABNORMAL LOW (ref 21–57)
TIBC: 413 ug/dL (ref 236–444)
UIBC: 372 ug/dL

## 2017-04-03 MED ORDER — SODIUM CHLORIDE 0.9 % IV SOLN
10.2000 mg/kg | Freq: Once | INTRAVENOUS | Status: AC
  Administered 2017-04-03: 900 mg via INTRAVENOUS
  Filled 2017-04-03: qty 4

## 2017-04-03 MED ORDER — HEPARIN SOD (PORK) LOCK FLUSH 100 UNIT/ML IV SOLN
500.0000 [IU] | Freq: Once | INTRAVENOUS | Status: AC | PRN
  Administered 2017-04-03: 500 [IU]
  Filled 2017-04-03: qty 5

## 2017-04-03 MED ORDER — SODIUM CHLORIDE 0.9% FLUSH
10.0000 mL | INTRAVENOUS | Status: DC | PRN
  Administered 2017-04-03: 10 mL
  Filled 2017-04-03: qty 10

## 2017-04-03 MED ORDER — SODIUM CHLORIDE 0.9 % IV SOLN
Freq: Once | INTRAVENOUS | Status: AC
  Administered 2017-04-03: 08:00:00 via INTRAVENOUS

## 2017-04-03 MED ORDER — FERUMOXYTOL INJECTION 510 MG/17 ML
510.0000 mg | Freq: Once | INTRAVENOUS | Status: AC
  Administered 2017-04-03: 510 mg via INTRAVENOUS
  Filled 2017-04-03: qty 17

## 2017-04-03 NOTE — Progress Notes (Signed)
Per Dr. Alvy Bimler: OK to treat with platelets of 91

## 2017-04-03 NOTE — Patient Instructions (Addendum)
[facility] Discharge Instructions for Patients Receiving Chemotherapy  Today you received the following chemotherapy agents: Bevacizumab (Avastin).  To help prevent nausea and vomiting after your treatment, we encourage you to take your nausea medication as prescribed.  If you develop nausea and vomiting that is not controlled by your nausea medication, call the clinic.   BELOW ARE SYMPTOMS THAT SHOULD BE REPORTED IMMEDIATELY:  *FEVER GREATER THAN 100.5 F  *CHILLS WITH OR WITHOUT FEVER  NAUSEA AND VOMITING THAT IS NOT CONTROLLED WITH YOUR NAUSEA MEDICATION  *UNUSUAL SHORTNESS OF BREATH  *UNUSUAL BRUISING OR BLEEDING  TENDERNESS IN MOUTH AND THROAT WITH OR WITHOUT PRESENCE OF ULCERS  *URINARY PROBLEMS  *BOWEL PROBLEMS  UNUSUAL RASH Items with * indicate a potential emergency and should be followed up as soon as possible.  Feel free to call the clinic should you have any questions or concerns. The clinic phone number is [phone].  Please show the [facility] at check-in to the Emergency Department and triage nurse.  Ferumoxytol injection What is this medicine? FERUMOXYTOL is an iron complex. Iron is used to make healthy red blood cells, which carry oxygen and nutrients throughout the body. This medicine is used to treat iron deficiency anemia in people with chronic kidney disease. This medicine may be used for other purposes; ask your health care provider or pharmacist if you have questions. COMMON BRAND NAME(S): Feraheme What should I tell my health care provider before I take this medicine? They need to know if you have any of these conditions: -anemia not caused by low iron levels -high levels of iron in the blood -magnetic resonance imaging (MRI) test scheduled -an unusual or allergic reaction to iron, other medicines, foods, dyes, or preservatives -pregnant or trying to get pregnant -breast-feeding How should I use this medicine? This  medicine is for injection into a vein. It is given by a health care professional in a hospital or clinic setting. Talk to your pediatrician regarding the use of this medicine in children. Special care may be needed. Overdosage: If you think you have taken too much of this medicine contact a poison control center or emergency room at once. NOTE: This medicine is only for you. Do not share this medicine with others. What if I miss a dose? It is important not to miss your dose. Call your doctor or health care professional if you are unable to keep an appointment. What may interact with this medicine? This medicine may interact with the following medications: -other iron products This list may not describe all possible interactions. Give your health care provider a list of all the medicines, herbs, non-prescription drugs, or dietary supplements you use. Also tell them if you smoke, drink alcohol, or use illegal drugs. Some items may interact with your medicine. What should I watch for while using this medicine? Visit your doctor or healthcare professional regularly. Tell your doctor or healthcare professional if your symptoms do not start to get better or if they get worse. You may need blood work done while you are taking this medicine. You may need to follow a special diet. Talk to your doctor. Foods that contain iron include: whole grains/cereals, dried fruits, beans, or peas, leafy green vegetables, and organ meats (liver, kidney). What side effects may I notice from receiving this medicine? Side effects that you should report to your doctor or health care professional as soon as possible: -allergic reactions like skin rash, itching or hives, swelling of the face,  lips, or tongue -breathing problems -changes in blood pressure -feeling faint or lightheaded, falls -fever or chills -flushing, sweating, or hot feelings -swelling of the ankles or feet Side effects that usually do not require medical  attention (report to your doctor or health care professional if they continue or are bothersome): -diarrhea -headache -nausea, vomiting -stomach pain This list may not describe all possible side effects. Call your doctor for medical advice about side effects. You may report side effects to FDA at 1-800-FDA-1088. Where should I keep my medicine? This drug is given in a hospital or clinic and will not be stored at home. NOTE: This sheet is a summary. It may not cover all possible information. If you have questions about this medicine, talk to your doctor, pharmacist, or health care provider.  2018 [employer]/Gold Standard (2015-03-02 12:41:49)

## 2017-04-03 NOTE — Telephone Encounter (Signed)
Spoke to patient regarding upcoming February appointments per 2/21 sch message.

## 2017-04-04 LAB — CA 125: Cancer Antigen (CA) 125: 47.2 U/mL — ABNORMAL HIGH (ref 0.0–38.1)

## 2017-04-08 ENCOUNTER — Other Ambulatory Visit: Payer: Self-pay | Admitting: Hematology and Oncology

## 2017-04-10 ENCOUNTER — Ambulatory Visit (HOSPITAL_COMMUNITY)
Admission: RE | Admit: 2017-04-10 | Discharge: 2017-04-10 | Disposition: A | Discharge status: Home / Self Care | Payer: Medicare Other | Source: Ambulatory Visit | Attending: Hematology and Oncology | Admitting: Hematology and Oncology

## 2017-04-10 DIAGNOSIS — D509 Iron deficiency anemia, unspecified: Secondary | ICD-10-CM | POA: Insufficient documentation

## 2017-04-10 MED ORDER — SODIUM CHLORIDE 0.9% FLUSH: 10.0000 mL | INTRAVENOUS | Status: DC | PRN

## 2017-04-10 MED ORDER — SODIUM CHLORIDE 0.9 % IV SOLN
510.0000 mg | Freq: Once | INTRAVENOUS | Status: AC
  Administered 2017-04-10: 510 mg via INTRAVENOUS
  Filled 2017-04-10: qty 17

## 2017-04-10 MED ORDER — HEPARIN SOD (PORK) LOCK FLUSH 100 UNIT/ML IV SOLN
500.0000 [IU] | INTRAVENOUS | Status: AC | PRN
  Administered 2017-04-10: 500 [IU]
  Filled 2017-04-10: qty 5

## 2017-04-10 NOTE — Discharge Instructions (Signed)

## 2017-04-10 NOTE — Progress Notes (Signed)
PATIENT CARE CENTER NOTE  Diagnosis: Iron Deficiency Anemia    Provider: Dr. Alvy Bimler   Procedure: Feraheme infusion    Note: Patient received infusion of IV Feraheme. Patient tolerated infusion well with no adverse reaction. Vitals remained stable. Patient monitored for 30 minutes after completion of iron. Patient alert, oriented and ambulatory at discharge. Discharge instructions given to patient.

## 2017-04-17 ENCOUNTER — Telehealth: Payer: Self-pay | Admitting: Hematology and Oncology

## 2017-04-17 ENCOUNTER — Inpatient Hospital Stay: Payer: Medicare Other | Attending: Hematology and Oncology

## 2017-04-17 ENCOUNTER — Inpatient Hospital Stay: Payer: Medicare Other

## 2017-04-17 ENCOUNTER — Encounter: Payer: Self-pay | Admitting: Hematology and Oncology

## 2017-04-17 ENCOUNTER — Inpatient Hospital Stay (HOSPITAL_BASED_OUTPATIENT_CLINIC_OR_DEPARTMENT_OTHER): Payer: Medicare Other | Admitting: Hematology and Oncology

## 2017-04-17 VITALS — BP 158/64 | HR 72 | Temp 97.8°F | Resp 17 | Ht 61.5 in | Wt 190.8 lb

## 2017-04-17 DIAGNOSIS — C786 Secondary malignant neoplasm of retroperitoneum and peritoneum: Secondary | ICD-10-CM | POA: Diagnosis not present

## 2017-04-17 DIAGNOSIS — Z5112 Encounter for antineoplastic immunotherapy: Secondary | ICD-10-CM | POA: Diagnosis present

## 2017-04-17 DIAGNOSIS — K5730 Diverticulosis of large intestine without perforation or abscess without bleeding: Secondary | ICD-10-CM

## 2017-04-17 DIAGNOSIS — G2581 Restless legs syndrome: Secondary | ICD-10-CM

## 2017-04-17 DIAGNOSIS — C482 Malignant neoplasm of peritoneum, unspecified: Secondary | ICD-10-CM

## 2017-04-17 DIAGNOSIS — Z7982 Long term (current) use of aspirin: Secondary | ICD-10-CM | POA: Diagnosis not present

## 2017-04-17 DIAGNOSIS — G620 Drug-induced polyneuropathy: Secondary | ICD-10-CM | POA: Insufficient documentation

## 2017-04-17 DIAGNOSIS — D509 Iron deficiency anemia, unspecified: Secondary | ICD-10-CM

## 2017-04-17 DIAGNOSIS — K769 Liver disease, unspecified: Secondary | ICD-10-CM | POA: Insufficient documentation

## 2017-04-17 DIAGNOSIS — Z7984 Long term (current) use of oral hypoglycemic drugs: Secondary | ICD-10-CM | POA: Insufficient documentation

## 2017-04-17 DIAGNOSIS — I10 Essential (primary) hypertension: Secondary | ICD-10-CM | POA: Insufficient documentation

## 2017-04-17 DIAGNOSIS — R161 Splenomegaly, not elsewhere classified: Secondary | ICD-10-CM

## 2017-04-17 DIAGNOSIS — Z79899 Other long term (current) drug therapy: Secondary | ICD-10-CM

## 2017-04-17 DIAGNOSIS — T451X5A Adverse effect of antineoplastic and immunosuppressive drugs, initial encounter: Secondary | ICD-10-CM

## 2017-04-17 DIAGNOSIS — D72819 Decreased white blood cell count, unspecified: Secondary | ICD-10-CM | POA: Diagnosis not present

## 2017-04-17 DIAGNOSIS — C569 Malignant neoplasm of unspecified ovary: Secondary | ICD-10-CM

## 2017-04-17 DIAGNOSIS — D696 Thrombocytopenia, unspecified: Secondary | ICD-10-CM | POA: Diagnosis not present

## 2017-04-17 LAB — COMPREHENSIVE METABOLIC PANEL
ALT: 51 U/L (ref 0–55)
AST: 53 U/L — ABNORMAL HIGH (ref 5–34)
Albumin: 3.8 g/dL (ref 3.5–5.0)
Alkaline Phosphatase: 102 U/L (ref 40–150)
Anion gap: 10 (ref 3–11)
BUN: 14 mg/dL (ref 7–26)
CO2: 22 mmol/L (ref 22–29)
Calcium: 9.4 mg/dL (ref 8.4–10.4)
Chloride: 110 mmol/L — ABNORMAL HIGH (ref 98–109)
Creatinine, Ser: 0.79 mg/dL (ref 0.60–1.10)
GFR calc Af Amer: >60 mL/min (ref >60)
GFR calc non Af Amer: >60 mL/min (ref >60)
Glucose, Bld: 131 mg/dL (ref 70–140)
Potassium: 4.2 mmol/L (ref 3.5–5.1)
Sodium: 142 mmol/L (ref 136–145)
Total Bilirubin: 0.8 mg/dL (ref 0.2–1.2)
Total Protein: 7.2 g/dL (ref 6.4–8.3)

## 2017-04-17 LAB — CBC WITH DIFFERENTIAL/PLATELET
Basophils Absolute: 0 10*3/uL (ref 0.0–0.1)
Basophils Relative: 1 %
Eosinophils Absolute: 0.1 10*3/uL (ref 0.0–0.5)
Eosinophils Relative: 3 %
HCT: 37 % (ref 34.8–46.6)
Hemoglobin: 11.6 g/dL (ref 11.6–15.9)
Lymphocytes Relative: 26 %
Lymphs Abs: 1.1 10*3/uL (ref 0.9–3.3)
MCH: 29.7 pg (ref 25.1–34.0)
MCHC: 31.4 g/dL — ABNORMAL LOW (ref 31.5–36.0)
MCV: 94.6 fL (ref 79.5–101.0)
Monocytes Absolute: 0.2 10*3/uL (ref 0.1–0.9)
Monocytes Relative: 5 %
Neutro Abs: 2.9 10*3/uL (ref 1.5–6.5)
Neutrophils Relative %: 65 %
Platelets: 108 10*3/uL — ABNORMAL LOW (ref 145–400)
RBC: 3.91 MIL/uL (ref 3.70–5.45)
RDW: 22 % — ABNORMAL HIGH (ref 11.2–14.5)
WBC: 4.3 10*3/uL (ref 3.9–10.3)

## 2017-04-17 MED ORDER — SODIUM CHLORIDE 0.9 % IV SOLN
Freq: Once | INTRAVENOUS | Status: AC
  Administered 2017-04-17: 10:00:00 via INTRAVENOUS

## 2017-04-17 MED ORDER — SODIUM CHLORIDE 0.9% FLUSH
10.0000 mL | INTRAVENOUS | Status: DC | PRN
  Administered 2017-04-17: 10 mL
  Filled 2017-04-17: qty 10

## 2017-04-17 MED ORDER — SODIUM CHLORIDE 0.9% FLUSH
10.0000 mL | Freq: Once | INTRAVENOUS | Status: AC
  Administered 2017-04-17: 10 mL
  Filled 2017-04-17: qty 10

## 2017-04-17 MED ORDER — HYDROCODONE-ACETAMINOPHEN 5-325 MG PO TABS: ORAL_TABLET | ORAL | 0 refills | Status: DC

## 2017-04-17 MED ORDER — HEPARIN SOD (PORK) LOCK FLUSH 100 UNIT/ML IV SOLN
500.0000 [IU] | Freq: Once | INTRAVENOUS | Status: AC | PRN
  Administered 2017-04-17: 500 [IU]
  Filled 2017-04-17: qty 5

## 2017-04-17 MED ORDER — SODIUM CHLORIDE 0.9 % IV SOLN
10.2000 mg/kg | Freq: Once | INTRAVENOUS | Status: AC
  Administered 2017-04-17: 900 mg via INTRAVENOUS
  Filled 2017-04-17: qty 32

## 2017-04-17 NOTE — Assessment & Plan Note (Signed)
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

## 2017-04-17 NOTE — Assessment & Plan Note (Signed)
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

## 2017-04-17 NOTE — Progress Notes (Signed)
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[facility]) So far, she have no clinical signs of disease progression Her tumor marker is stable I plan to continue maintenance treatment with Avastin every 2 weeks I plan to repeat CT imaging next month before I see her back for objective evidence of response to treatment  Iron deficiency anemia She has recurrent iron deficiency anemia of unknown etiology We discussed the role of upper and lower endoscopy for evaluation In order to pursue endoscopy evaluation, we would have to put her Avastin on hold for at least 6-8 weeks For now, the patient is not interested to pursue this In the meantime, I will continue close monitoring of iron studies and will continue to give her iron replacement therapy as needed for severe iron deficiency anemia  Essential hypertension She brought with her a copy of her blood pressure monitoring at home.  There was only one measurement with systolic blood pressure of over 140.  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 She is not symptomatic.  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 She takes a small amount of pain medicine for this and I have refilled her prescription She denies nausea or constipation with this We discussed narcotic refill policy   Orders Placed This Encounter   Procedures  . CT ABDOMEN PELVIS W CONTRAST    Standing Status:   Future    Standing Expiration Date:   04/18/2018    Order Specific Question:   If indicated for the ordered procedure, I authorize the administration of contrast media per Radiology protocol    Answer:   Yes    Order Specific Question:   Preferred imaging location?    Answer:   [facility]    Order Specific Question:   Radiology Contrast Protocol - do NOT remove file path    Answer:   [email or web address]  . Iron and TIBC    Standing Status:   Standing    Number of Occurrences:   9    Standing Expiration Date:   04/18/2018  . Ferritin    Standing Status:   Standing    Number of Occurrences:   9    Standing Expiration Date:   04/18/2018    INTERVAL HISTORY: Please see below for problem oriented charting. She returns with her husband for further follow-up Her restless legs has improved with recent intravenous iron infusion The patient denies any recent signs or symptoms of bleeding such as spontaneous epistaxis, hematuria or hematochezia. She has stable neuropathic pain.  She continues to take pain medicine as needed periodically to control her pain. She denies recent nausea, constipation, abdominal bloating or vaginal bleeding. She brought with her data from blood pressure monitoring over the past month which appears satisfactory.   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  No worrisome liver mass.  There are associated portal venous collaterals and splenomegaly consistent with portal venous hypertension. 2.  No other significant upper abdominal findings      11/08/2013 Imaging    US showed ascites      11/08/2013 Initial Diagnosis    Patient had ~ 2 months of some urinary incontinence, saw Dr Ronita Hipps in  10-2013, with pelvic mass on exam       11/12/2013 Imaging    There are findings of progressive hepatic cirrhosis with a large amount of ascites and mild splenomegaly. There are venous collaterals present. 2. There is abnormal thickening of the peritoneal surface along the left mid and lower abdominal wall. There is abnormal soft tissue density in the pelvis as well which could reflect the clinically suspected ovarian malignancy but the findings are nondiagnostic. 3. There is a new small left pleural effusion. 4. There is no acute bowel abnormality.      11/18/2013 Imaging    Successful ultrasound guided diagnostic and therapeutic paracentesis yielding 4.1 liters of ascites.       11/18/2013 Pathology Results    PERITONEAL/ASCITIC FLUID (SPECIMEN 1 OF 1 COLLECTED 11/18/13): SEROUS CARCINOMA, PLEASE SEE COMMENT.      12/01/2013 Tumor Marker    Patient's tumor was tested for the following markers: CA125 Results of the tumor marker test revealed 1938      12/03/2013 Imaging    Successful ultrasound-guided therapeutic paracentesis yielding 3.6 liters of peritoneal fluid.      12/07/2013 - 05/30/2014 Chemotherapy    She received 3 cycles of carboplatin and Taxol, interrupted cycle 4 for surgery.  She subsequently completed 3 more cycles of chemotherapy after surgery      12/20/2013 Imaging    Successful ultrasound-guided diagnostic and therapeutic paracentesis yielding 2.2 liters of peritoneal fluid      12/20/2013 Pathology Results    PERITONEAL/ASCITIC FLUID(SPECIMEN 1 OF 1 COLLECTED 12/20/13): MALIGNANT CELLS CONSISTENT WITH SEROUS CARCINOMA      01/13/2014 Tumor Marker    Patient's tumor was tested for the following markers: CA125 Results of the tumor marker test revealed 227      02/07/2014 Tumor Marker    Patient's tumor was tested for the following markers: CA125 Results of the tumor marker test revealed 130      02/15/2014 Genetic Testing    Genetics testing from 02-2014  normal ([employer] panel)      02/23/2014 Imaging    Interval decrease in ascites. 2. Morphologic changes in the liver consistent with cirrhosis. 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 Portal vein remains patent at this time. 3. Persistent but decreased abnormal soft tissue attenuation tracking in the omental fat. This may be secondary to interval improvement in metastatic disease. 4. Peritoneal thickening seen in the left abdomen and pelvis on the previous study has decreased and nearly resolved in the interval. This also suggests interval improvement in metastatic disease.       03/07/2014 Procedure    Ultrasound and fluoroscopically guided right internal jugular single lumen power port catheter insertion. Tip in the SVC/RA junction. Catheter ready for use.      03/17/2014 Tumor Marker    Patient's tumor was tested for the following markers: CA125 Results of the tumor marker test revealed 45      03/29/2014 Pathology Results    1. Omentum, resection for tumor - HIGH GRADE SEROUS CARCINOMA, SEE COMMENT. 2. Ovary and fallopian tube, right - HIGH GRADE SEROUS CARCINOMA,  SEE COMMENT. 3. Ovary and fallopian tube, left - HIGH GRADE SEROUS CARCINOMA, SEE COMMENT. Diagnosis Note 1. Nests and clusters of malignant cells are invading the omental tissue (part #1) with associated fibrosis. The cells are pleomorphic with prominent nucleoli. There are scattered psammoma bodies. The ovaries are atrophic and exhibit multiple foci of surface based invasive carcinoma and associated fibrosis. The fallopian tubes have a few foci of carcinoma, also superficially located. There are no precursor lesions noted in the ovaries or fallopian tubes (the entire tubes and ovaries were submitted for evaluation). While there is some retraction artifact, there are several foci suspicious for lymphovascular invasion. 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 Given the fibrosis, there does appear to be a small amount of treatment effect, however, there is abundant residual tumor.       03/29/2014 Surgery    Procedure(s) Performed: 1. Exploratory laparotomy with bilateral salpingo-oophorectomy, omentectomy radical tumor debulking for ovarian cancer .  Surgeon: Thereasa Solo, MD.  Assistant Surgeon: Lahoma Crocker, M.D. Assistant: (an MD assistant was necessary for tissue manipulation, retraction and positioning due to the complexity of the case and hospital policies).  Operative Findings: 10cm omental cake from hepatic to splenic flexure, densely adherent to transverse colon. Milliary studding of tumor implants (<45m, too numerous in number to count) adherent to the mesentery of the small bowel and small bowel wall. Small volume (100cc) ascites. Small ovaries bilaterally, densely adherent to the pelvic cul de sac. Left ovary and tube densely adherent to sigmoid colon. Cirrhotic liver with hepatomegaly and splenomegaly.     This represented an optimal cytoreduction (R1) with visible disease residual on the bowel wall and mesentery (millial, <368mimplants).        04/18/2014 Tumor Marker    Patient's tumor was tested for the following markers: CA125 Results of the tumor marker test revealed 122      05/16/2014 Tumor Marker    Patient's tumor was tested for the following markers: CA125 Results of the tumor marker test revealed 30      06/10/2014 Tumor Marker    Patient's tumor was tested for the following markers: CA125 Results of the tumor marker test revealed 19      07/04/2014 Imaging    Interval improvement in the appearance of peritoneal metastasis secondary to ovarian cancer. 2. 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       08/18/2014 Tumor Marker    Patient's tumor was tested for the following markers:  CA125 Results of the tumor marker test revealed 16      10/13/2014 Tumor Marker    Patient's tumor was tested for the following markers: CA125 Results of the tumor marker test revealed 14      11/21/2014 Tumor Marker    Patient's tumor was tested for the following markers: CA125 Results of the tumor marker test revealed 16      12/28/2014 Tumor Marker    Patient's tumor was tested for the following markers: CA125 Results of the tumor marker test revealed 762      02/27/2015 Tumor Marker    Patient's tumor was tested for the following markers: CA125 Results of the tumor marker test revealed 20.2      03/22/2015 Imaging    Filler appearance to the prior exam, with very subtle fluid tracking along portions of the liver, and very  minimal nodularity along the right paracolic gutter representing residua from the prior peritoneal cancer. The current abnormalities could simply be post therapy findings rather than necessarily representing residual malignancy. No new or enlarging lesions are identified. 2. Hepatic cirrhosis and splenomegaly. There is some gastric varices suggesting portal venous hypertension. 3. Left foraminal stenosis at L4-5 due to spurring. There is likely also some central narrowing of the thecal sac at this level. 4. Bibasilar scarring. 5. Chronic mild left mid kidney scarring      03/27/2015 Tumor Marker    Patient's tumor was tested for the following markers: CA125 Results of the tumor marker test revealed 25.3      04/24/2015 Imaging    Disc bulge L2-3 with mild spinal stenosis and right foraminal encroachment. 2. Disc bulge L3-4 with mild spinal stenosis. 3. Multifactorial spinal, left lateral recess and foraminal stenosis L4-5. There is grade 1 anterolisthesis without evident dynamic instability.      05/29/2015 Tumor Marker    Patient's tumor was tested for the following markers: CA125 Results of the tumor marker test revealed 29.9      08/21/2015 Tumor Marker     Patient's tumor was tested for the following markers: CA125 Results of the tumor marker test revealed 45      09/18/2015 Tumor Marker    Patient's tumor was tested for the following markers: CA125 Results of the tumor marker test revealed 47      09/27/2015 Imaging    New small bowel mesenteric nodule and enlarged left external iliac lymph node, highly worrisome for metastatic disease. 2. Cirrhosis and splenomegaly      10/10/2015 Tumor Marker    Patient's tumor was tested for the following markers: CA125 Results of the tumor marker test revealed 53.4      10/10/2015 - 12/11/2015 Chemotherapy    She received 4 cycles of carboplatin only      11/20/2015 Tumor Marker    Patient's tumor was tested for the following markers: CA125 Results of the tumor marker test revealed 41.3      12/20/2015 Imaging    CT: Mild left external iliac lymphadenopathy is slightly decreased. Mildly enlarged lower mesenteric nodule is slightly decreased. 2. No new or progressive metastatic disease in the abdomen or pelvis. 3. Cirrhosis. Stable subcentimeter hypodense left liver lobe lesion. No new liver lesions. 4. Stable mild splenomegaly.  No ascites. 5. Mild sigmoid diverticulosis.      01/01/2016 -  Anti-estrogen oral therapy    She was placed on tamoxifen. Had letrozole initially but switched to tamoxifen due to poor tolerance      01/15/2016 Tumor Marker    Patient's tumor was tested for the following markers: CA125 Results of the tumor marker test revealed 31.4      02/19/2016 Tumor Marker    Patient's tumor was tested for the following markers: CA125 Results of the tumor marker test revealed 36.5      05/13/2016 Imaging    No evidence of disease progression within the abdomen or pelvis. Previously noted small central mesenteric nodule and left external iliac node appear slightly smaller. 2. Stable changes of hepatic cirrhosis and portal hypertension with associated splenomegaly. No new or enlarging  hepatic lesions are identified. 3. No acute findings.      05/13/2016 Tumor Marker    Patient's tumor was tested for the following markers: CA125 Results of the tumor marker test revealed 41.2      06/24/2016 Tumor Marker    Patient's tumor was  tested for the following markers: CA125 Results of the tumor marker test revealed 47.3      08/20/2016 Imaging    1. The left external iliac lymph node is slightly increased in size compared to the prior exam, currently 1.1 cm and previously 0.9 cm in diameter. 2. Stable central mesenteric lymph node at 0.9 cm in diameter. 3. Stable trace free pelvic fluid and stable slight thickening along the right paracolic gutter, without well-defined peritoneal nodularity. 4. Other imaging findings of potential clinical significance: Subsegmental atelectasis or scarring in the lung bases. Hepatic cirrhosis. Left renal scarring. Mild splenomegaly. Sigmoid colon diverticulosis. Impingement at L4-5 due to spondylosis and degenerative disc disease broad Schmorl' s nodes at L3-L4. Pelvic floor laxity      08/23/2016 Tumor Marker    Patient's tumor was tested for the following markers: CA125 Results of the tumor marker test revealed 80.2      09/04/2016 Imaging    LV EF: 60% -   65%      09/12/2016 Tumor Marker    Patient's tumor was tested for the following markers: CA125 Results of the tumor marker test revealed 98.5      09/12/2016 -  Chemotherapy    The patient received Doxil and Avastin.       09/26/2016 Tumor Marker    Patient's tumor was tested for the following markers: CA125 Results of the tumor marker test revealed 68.9      10/10/2016 Tumor Marker    Patient's tumor was tested for the following markers: CA125 Results of the tumor marker test revealed 65.6      11/07/2016 Tumor Marker    Patient's tumor was tested for the following markers: CA125 Results of the tumor marker test revealed 46.4      11/29/2016 Imaging    Stable mild peritoneal  thickening, suspicious for peritoneal carcinomatosis. No new or progressive disease identified. No evidence of ascites.  No significant change in 10 mm left external iliac and 8 mm mesenteric lymph nodes.  Stable hepatic cirrhosis, and splenomegaly consistent with portal venous hypertension.  Colonic diverticulosis, without radiographic evidence of diverticulitis.      12/02/2016 Imaging    Normal LV size with EF 55%. Basal inferior and basal inferoseptal hypokinesis. Normal RV size and systolic function. No significant valvular abnormalities.      12/05/2016 Tumor Marker    Patient's tumor was tested for the following markers: CA125 Results of the tumor marker test revealed 49.1      01/09/2017 Tumor Marker    Patient's tumor was tested for the following markers: CA125 Results of the tumor marker test revealed 47.2      02/06/2017 Tumor Marker    Patient's tumor was tested for the following markers: CA125 Results of the tumor marker test revealed 44.1      02/18/2017 Imaging    Stable mild peritoneal thickening. No new or progressive disease identified within the abdomen or pelvis.  Stable tiny sub-cm left external iliac and mesenteric lymph nodes.  Stable hepatic cirrhosis, and splenomegaly consistent with portal venous hypertension. No evidence of hepatic neoplasm.  Colonic diverticulosis, without radiographic evidence of diverticulitis.      03/06/2017 Tumor Marker    Patient's tumor was tested for the following markers: CA125 Results of the tumor marker test revealed 50.4      03/17/2017 Imaging    - Left ventricle: The cavity size was normal. Wall thickness was normal. Systolic function was normal. The estimated ejection fraction was  in the range of 55% to 60%. Wall motion was normal; there were no regional wall motion abnormalities. Features are consistent with a pseudonormal left ventricular filling pattern, with concomitant abnormal relaxation and increased filling  pressure (grade 2 diastolic dysfunction). - Mitral valve: There was mild regurgitation. - Left atrium: The atrium was mildly dilated      04/03/2017 Tumor Marker    Patient's tumor was tested for the following markers: CA125 Results of the tumor marker test revealed 47.2       REVIEW OF SYSTEMS:   Constitutional: Denies fevers, chills or abnormal weight loss Eyes: Denies blurriness of vision Ears, nose, mouth, throat, and face: Denies mucositis or sore throat Respiratory: Denies cough, dyspnea or wheezes Cardiovascular: Denies palpitation, chest discomfort or lower extremity swelling Gastrointestinal:  Denies nausea, heartburn or change in bowel habits Skin: Denies abnormal skin rashes Lymphatics: Denies new lymphadenopathy or easy bruising Neurological:Denies numbness, tingling or new weaknesses Behavioral/Psych: Mood is stable, no new changes  All other systems were reviewed with the patient and are negative.  I have reviewed the past medical history, past surgical history, social history and family history with the patient and they are unchanged from previous note.  ALLERGIES:  is allergic to shellfish allergy; benadryl [diphenhydramine hcl]; penicillins; and tape.  MEDICATIONS:  Current Outpatient Medications  Medication Sig Dispense Refill  . amLODipine (NORVASC) 10 MG tablet Take 1 tablet (10 mg total) by mouth daily. 30 tablet 11  . aspirin EC 81 MG tablet Take 81 mg by mouth daily.    . bisoprolol (ZEBETA) 10 MG tablet Take 1 tablet (10 mg total) by mouth daily. 30 tablet 11  . cholecalciferol (VITAMIN D) 1000 units tablet Take 1,000 Units by mouth daily.    Marland Kitchen gabapentin (NEURONTIN) 300 MG capsule Take 2 capsules (600 mg total) by mouth 3 (three) times daily. 120 capsule 3  . HYDROcodone-acetaminophen (NORCO/VICODIN) 5-325 MG tablet take 1 tablet by mouth every 6 hours if needed for MODERATE PAIN 60 tablet 0  . lidocaine-prilocaine (EMLA) cream Apply to Porta-cath  1-2  hours prior to access as directed. 30 g 1  . metFORMIN (GLUCOPHAGE-XR) 500 MG 24 hr tablet Take 1,000 mg by mouth 2 (two) times daily.   0  . ondansetron (ZOFRAN) 8 MG tablet Take 1 tablet (8 mg total) by mouth every 8 (eight) hours as needed for nausea. 30 tablet 3  . pantoprazole (PROTONIX) 40 MG tablet Take 40 mg by mouth daily.   0  . promethazine (PHENERGAN) 25 MG tablet Take 1 tablet (25 mg total) by mouth every 6 (six) hours as needed for nausea. 30 tablet 3  . rotigotine (NEUPRO) 4 MG/24HR Place 1 patch onto the skin daily. 90 patch 1   No current facility-administered medications for this visit.     PHYSICAL EXAMINATION: ECOG PERFORMANCE STATUS: 1 - Symptomatic but completely ambulatory  Vitals:   04/17/17 0847  BP: (!) 158/64  Pulse: 72  Resp: 17  Temp: 97.8 F (36.6 C)  SpO2: 100%   Filed Weights   04/17/17 0847  Weight: 190 lb 12.8 oz (86.5 kg)    GENERAL:alert, no distress and comfortable SKIN: skin color, texture, turgor are normal, no rashes or significant lesions EYES: normal, Conjunctiva are pink and non-injected, sclera clear OROPHARYNX:no exudate, no erythema and lips, buccal mucosa, and tongue normal  NECK: supple, thyroid normal size, non-tender, without nodularity LYMPH:  no palpable lymphadenopathy in the cervical, axillary or inguinal LUNGS: clear to auscultation  and percussion with normal breathing effort HEART: regular rate & rhythm and no murmurs and no lower extremity edema ABDOMEN:abdomen soft, non-tender and normal bowel sounds Musculoskeletal:no cyanosis of digits and no clubbing  NEURO: alert & oriented x 3 with fluent speech, no focal motor/sensory deficits  LABORATORY DATA:  I have reviewed the data as listed    Component Value Date/Time   NA 143 04/03/2017 0744   NA 142 02/06/2017 0742   K 3.9 04/03/2017 0744   K 3.9 02/06/2017 0742   CL 110 (H) 04/03/2017 0744   CO2 22 04/03/2017 0744   CO2 22 02/06/2017 0742   GLUCOSE 113  04/03/2017 0744   GLUCOSE 156 (H) 02/06/2017 0742   BUN 15 04/03/2017 0744   BUN 13.5 02/06/2017 0742   CREATININE 0.76 04/03/2017 0744   CREATININE 0.8 02/06/2017 0742   CALCIUM 9.0 04/03/2017 0744   CALCIUM 8.5 02/06/2017 0742   PROT 6.7 04/03/2017 0744   PROT 6.9 02/06/2017 0742   ALBUMIN 3.6 04/03/2017 0744   ALBUMIN 3.7 02/06/2017 0742   AST 34 04/03/2017 0744   AST 47 (H) 02/06/2017 0742   ALT 37 04/03/2017 0744   ALT 54 02/06/2017 0742   ALKPHOS 94 04/03/2017 0744   ALKPHOS 130 02/06/2017 0742   BILITOT 0.8 04/03/2017 0744   BILITOT 0.65 02/06/2017 0742   GFRNONAA >60 04/03/2017 0744   GFRAA >60 04/03/2017 0744    No results found for: SPEP, UPEP  Lab Results  Component Value Date   WBC 4.3 04/17/2017   NEUTROABS 2.9 04/17/2017   HGB 11.6 04/17/2017   HCT 37.0 04/17/2017   MCV 94.6 04/17/2017   PLT 108 (L) 04/17/2017      Chemistry      Component Value Date/Time   NA 143 04/03/2017 0744   NA 142 02/06/2017 0742   K 3.9 04/03/2017 0744   K 3.9 02/06/2017 0742   CL 110 (H) 04/03/2017 0744   CO2 22 04/03/2017 0744   CO2 22 02/06/2017 0742   BUN 15 04/03/2017 0744   BUN 13.5 02/06/2017 0742   CREATININE 0.76 04/03/2017 0744   CREATININE 0.8 02/06/2017 0742      Component Value Date/Time   CALCIUM 9.0 04/03/2017 0744   CALCIUM 8.5 02/06/2017 0742   ALKPHOS 94 04/03/2017 0744   ALKPHOS 130 02/06/2017 0742   AST 34 04/03/2017 0744   AST 47 (H) 02/06/2017 0742   ALT 37 04/03/2017 0744   ALT 54 02/06/2017 0742   BILITOT 0.8 04/03/2017 0744   BILITOT 0.65 02/06/2017 0742      All questions were answered. The patient knows to call the clinic with any problems, questions or concerns. No barriers to learning was detected.  I spent 20 minutes counseling the patient face to face. The total time spent in the appointment was 25 minutes and more than 50% was on counseling and review of test results  [name], MD 04/17/2017 9:06 AM

## 2017-04-17 NOTE — Assessment & Plan Note (Signed)
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 She is not symptomatic.  Recommend close observation only

## 2017-04-17 NOTE — Telephone Encounter (Signed)
Gave patient AVs and calendar of upcoming march and April appointments.

## 2017-04-17 NOTE — Patient Instructions (Signed)
[facility] Discharge Instructions for Patients Receiving Chemotherapy  Today you received the following chemotherapy agents Avastin  To help prevent nausea and vomiting after your treatment, we encourage you to take your nausea medication as directed   If you develop nausea and vomiting that is not controlled by your nausea medication, call the clinic.   BELOW ARE SYMPTOMS THAT SHOULD BE REPORTED IMMEDIATELY:  *FEVER GREATER THAN 100.5 F  *CHILLS WITH OR WITHOUT FEVER  NAUSEA AND VOMITING THAT IS NOT CONTROLLED WITH YOUR NAUSEA MEDICATION  *UNUSUAL SHORTNESS OF BREATH  *UNUSUAL BRUISING OR BLEEDING  TENDERNESS IN MOUTH AND THROAT WITH OR WITHOUT PRESENCE OF ULCERS  *URINARY PROBLEMS  *BOWEL PROBLEMS  UNUSUAL RASH Items with * indicate a potential emergency and should be followed up as soon as possible.  Feel free to call the clinic should you have any questions or concerns. The clinic phone number is [phone].  Please show the CHEMO ALERT CARD at check-in to the Emergency Department and triage nurse.   

## 2017-04-17 NOTE — Assessment & Plan Note (Signed)
She brought with her a copy of her blood pressure monitoring at home.  There was only one measurement with systolic blood pressure of over 140.  Majority of the other data reveals systolic blood pressure around 242 and a diastolic blood pressure under 80 Blood pressure is mildly elevated today but could be due to whitecoat hypertension For now, I will continue treatment without dose adjustment and she will continue her antihypertensives as directed

## 2017-04-17 NOTE — Assessment & Plan Note (Signed)
Her peripheral neuropathy is stable She will continue medication as directed. She takes a small amount of pain medicine for this and I have refilled her prescription She denies nausea or constipation with this We discussed narcotic refill policy

## 2017-05-01 ENCOUNTER — Other Ambulatory Visit: Payer: Medicare Other

## 2017-05-01 ENCOUNTER — Inpatient Hospital Stay: Payer: Medicare Other

## 2017-05-01 VITALS — BP 143/69 | HR 57 | Temp 98.3°F | Resp 17

## 2017-05-01 DIAGNOSIS — Z5112 Encounter for antineoplastic immunotherapy: Secondary | ICD-10-CM | POA: Diagnosis not present

## 2017-05-01 DIAGNOSIS — C482 Malignant neoplasm of peritoneum, unspecified: Secondary | ICD-10-CM

## 2017-05-01 LAB — COMPREHENSIVE METABOLIC PANEL
ALT: 47 U/L (ref 0–55)
AST: 45 U/L — ABNORMAL HIGH (ref 5–34)
Albumin: 3.8 g/dL (ref 3.5–5.0)
Alkaline Phosphatase: 122 U/L (ref 40–150)
Anion gap: 12 — ABNORMAL HIGH (ref 3–11)
BUN: 11 mg/dL (ref 7–26)
CO2: 24 mmol/L (ref 22–29)
Calcium: 9.1 mg/dL (ref 8.4–10.4)
Chloride: 108 mmol/L (ref 98–109)
Creatinine, Ser: 0.8 mg/dL (ref 0.60–1.10)
GFR calc Af Amer: >60 mL/min (ref >60)
GFR calc non Af Amer: >60 mL/min (ref >60)
Glucose, Bld: 107 mg/dL (ref 70–140)
Potassium: 4.1 mmol/L (ref 3.5–5.1)
Sodium: 144 mmol/L (ref 136–145)
Total Bilirubin: 0.7 mg/dL (ref 0.2–1.2)
Total Protein: 7.1 g/dL (ref 6.4–8.3)

## 2017-05-01 LAB — CBC WITH DIFFERENTIAL/PLATELET
Basophils Absolute: 0 10*3/uL (ref 0.0–0.1)
Basophils Relative: 1 %
Eosinophils Absolute: 0.1 10*3/uL (ref 0.0–0.5)
Eosinophils Relative: 3 %
HCT: 38.9 % (ref 34.8–46.6)
Hemoglobin: 12.6 g/dL (ref 11.6–15.9)
Lymphocytes Relative: 26 %
Lymphs Abs: 1.1 10*3/uL (ref 0.9–3.3)
MCH: 30.5 pg (ref 25.1–34.0)
MCHC: 32.4 g/dL (ref 31.5–36.0)
MCV: 94.2 fL (ref 79.5–101.0)
Monocytes Absolute: 0.2 10*3/uL (ref 0.1–0.9)
Monocytes Relative: 4 %
Neutro Abs: 2.8 10*3/uL (ref 1.5–6.5)
Neutrophils Relative %: 66 %
Platelets: 77 10*3/uL — ABNORMAL LOW (ref 145–400)
RBC: 4.13 MIL/uL (ref 3.70–5.45)
RDW: 19.7 % — ABNORMAL HIGH (ref 11.2–14.5)
WBC: 4.2 10*3/uL (ref 3.9–10.3)

## 2017-05-01 MED ORDER — HEPARIN SOD (PORK) LOCK FLUSH 100 UNIT/ML IV SOLN
500.0000 [IU] | Freq: Once | INTRAVENOUS | Status: AC | PRN
  Administered 2017-05-01: 500 [IU]
  Filled 2017-05-01: qty 5

## 2017-05-01 MED ORDER — SODIUM CHLORIDE 0.9% FLUSH
10.0000 mL | INTRAVENOUS | Status: DC | PRN
  Administered 2017-05-01: 10 mL
  Filled 2017-05-01: qty 10

## 2017-05-01 MED ORDER — SODIUM CHLORIDE 0.9 % IV SOLN
Freq: Once | INTRAVENOUS | Status: AC
  Administered 2017-05-01: 09:00:00 via INTRAVENOUS

## 2017-05-01 MED ORDER — SODIUM CHLORIDE 0.9 % IV SOLN
10.2000 mg/kg | Freq: Once | INTRAVENOUS | Status: AC
  Administered 2017-05-01: 900 mg via INTRAVENOUS
  Filled 2017-05-01: qty 32

## 2017-05-01 NOTE — Progress Notes (Signed)
OK to treat with labs today per MD Alvy Bimler

## 2017-05-01 NOTE — Patient Instructions (Signed)
[facility] Discharge Instructions for Patients Receiving Chemotherapy  Today you received the following chemotherapy agent: Avastin.  To help prevent nausea and vomiting after your treatment, we encourage you to take your nausea medication as directed.   If you develop nausea and vomiting that is not controlled by your nausea medication, call the clinic.   BELOW ARE SYMPTOMS THAT SHOULD BE REPORTED IMMEDIATELY:  *FEVER GREATER THAN 100.5 F  *CHILLS WITH OR WITHOUT FEVER  NAUSEA AND VOMITING THAT IS NOT CONTROLLED WITH YOUR NAUSEA MEDICATION  *UNUSUAL SHORTNESS OF BREATH  *UNUSUAL BRUISING OR BLEEDING  TENDERNESS IN MOUTH AND THROAT WITH OR WITHOUT PRESENCE OF ULCERS  *URINARY PROBLEMS  *BOWEL PROBLEMS  UNUSUAL RASH Items with * indicate a potential emergency and should be followed up as soon as possible.  Feel free to call the clinic should you have any questions or concerns. The clinic phone number is [phone].  Please show the Lithium at check-in to the Emergency Department and triage nurse.

## 2017-05-06 ENCOUNTER — Other Ambulatory Visit: Payer: Self-pay | Admitting: Hematology and Oncology

## 2017-05-14 ENCOUNTER — Ambulatory Visit (HOSPITAL_COMMUNITY)
Admission: RE | Admit: 2017-05-14 | Discharge: 2017-05-14 | Disposition: A | Discharge status: Home / Self Care | Payer: Medicare Other | Source: Ambulatory Visit | Attending: Hematology and Oncology | Admitting: Hematology and Oncology

## 2017-05-14 DIAGNOSIS — K7460 Unspecified cirrhosis of liver: Secondary | ICD-10-CM | POA: Diagnosis not present

## 2017-05-14 DIAGNOSIS — C482 Malignant neoplasm of peritoneum, unspecified: Secondary | ICD-10-CM | POA: Insufficient documentation

## 2017-05-14 DIAGNOSIS — C569 Malignant neoplasm of unspecified ovary: Secondary | ICD-10-CM | POA: Insufficient documentation

## 2017-05-14 DIAGNOSIS — R161 Splenomegaly, not elsewhere classified: Secondary | ICD-10-CM | POA: Insufficient documentation

## 2017-05-14 MED ORDER — IOPAMIDOL (ISOVUE-300) INJECTION 61%
INTRAVENOUS | Status: AC
  Filled 2017-05-14: qty 100

## 2017-05-14 MED ORDER — IOPAMIDOL (ISOVUE-300) INJECTION 61%
30.0000 mL | Freq: Once | INTRAVENOUS | Status: AC | PRN
  Administered 2017-05-14: 30 mL via ORAL

## 2017-05-14 MED ORDER — IOPAMIDOL (ISOVUE-300) INJECTION 61%
INTRAVENOUS | Status: AC
  Filled 2017-05-14: qty 30

## 2017-05-14 MED ORDER — HEPARIN SOD (PORK) LOCK FLUSH 100 UNIT/ML IV SOLN
500.0000 [IU] | Freq: Once | INTRAVENOUS | Status: AC
  Administered 2017-05-14: 500 [IU] via INTRAVENOUS

## 2017-05-14 MED ORDER — HEPARIN SOD (PORK) LOCK FLUSH 100 UNIT/ML IV SOLN
INTRAVENOUS | Status: AC
  Filled 2017-05-14: qty 5

## 2017-05-14 MED ORDER — IOPAMIDOL (ISOVUE-300) INJECTION 61%
100.0000 mL | Freq: Once | INTRAVENOUS | Status: AC | PRN
  Administered 2017-05-14: 100 mL via INTRAVENOUS

## 2017-05-15 ENCOUNTER — Inpatient Hospital Stay: Payer: Medicare Other | Attending: Hematology and Oncology

## 2017-05-15 ENCOUNTER — Inpatient Hospital Stay (HOSPITAL_BASED_OUTPATIENT_CLINIC_OR_DEPARTMENT_OTHER): Payer: Medicare Other | Admitting: Hematology and Oncology

## 2017-05-15 ENCOUNTER — Telehealth: Payer: Self-pay | Admitting: Hematology and Oncology

## 2017-05-15 ENCOUNTER — Inpatient Hospital Stay: Payer: Medicare Other

## 2017-05-15 ENCOUNTER — Encounter: Payer: Self-pay | Admitting: Hematology and Oncology

## 2017-05-15 VITALS — BP 143/66 | HR 60 | Temp 98.0°F | Resp 16

## 2017-05-15 DIAGNOSIS — K7460 Unspecified cirrhosis of liver: Secondary | ICD-10-CM

## 2017-05-15 DIAGNOSIS — D72819 Decreased white blood cell count, unspecified: Secondary | ICD-10-CM | POA: Insufficient documentation

## 2017-05-15 DIAGNOSIS — K7469 Other cirrhosis of liver: Secondary | ICD-10-CM | POA: Insufficient documentation

## 2017-05-15 DIAGNOSIS — D696 Thrombocytopenia, unspecified: Secondary | ICD-10-CM | POA: Diagnosis not present

## 2017-05-15 DIAGNOSIS — Z7982 Long term (current) use of aspirin: Secondary | ICD-10-CM

## 2017-05-15 DIAGNOSIS — Z79899 Other long term (current) drug therapy: Secondary | ICD-10-CM | POA: Diagnosis not present

## 2017-05-15 DIAGNOSIS — C482 Malignant neoplasm of peritoneum, unspecified: Secondary | ICD-10-CM | POA: Diagnosis not present

## 2017-05-15 DIAGNOSIS — K7581 Nonalcoholic steatohepatitis (NASH): Secondary | ICD-10-CM | POA: Insufficient documentation

## 2017-05-15 DIAGNOSIS — Z5112 Encounter for antineoplastic immunotherapy: Secondary | ICD-10-CM | POA: Insufficient documentation

## 2017-05-15 DIAGNOSIS — C569 Malignant neoplasm of unspecified ovary: Secondary | ICD-10-CM

## 2017-05-15 DIAGNOSIS — I10 Essential (primary) hypertension: Secondary | ICD-10-CM | POA: Insufficient documentation

## 2017-05-15 DIAGNOSIS — D509 Iron deficiency anemia, unspecified: Secondary | ICD-10-CM

## 2017-05-15 LAB — COMPREHENSIVE METABOLIC PANEL
ALT: 42 U/L (ref 0–55)
AST: 42 U/L — ABNORMAL HIGH (ref 5–34)
Albumin: 3.9 g/dL (ref 3.5–5.0)
Alkaline Phosphatase: 91 U/L (ref 40–150)
Anion gap: 10 (ref 3–11)
BUN: 10 mg/dL (ref 7–26)
CO2: 24 mmol/L (ref 22–29)
Calcium: 9.1 mg/dL (ref 8.4–10.4)
Chloride: 109 mmol/L (ref 98–109)
Creatinine, Ser: 0.74 mg/dL (ref 0.60–1.10)
GFR calc Af Amer: >60 mL/min (ref >60)
GFR calc non Af Amer: >60 mL/min (ref >60)
Glucose, Bld: 114 mg/dL (ref 70–140)
Potassium: 4 mmol/L (ref 3.5–5.1)
Sodium: 143 mmol/L (ref 136–145)
Total Bilirubin: 0.8 mg/dL (ref 0.2–1.2)
Total Protein: 7.2 g/dL (ref 6.4–8.3)

## 2017-05-15 LAB — IRON AND TIBC
Iron: 78 ug/dL (ref 41–142)
Saturation Ratios: 23 % (ref 21–57)
TIBC: 344 ug/dL (ref 236–444)
UIBC: 266 ug/dL

## 2017-05-15 LAB — CBC WITH DIFFERENTIAL/PLATELET
Basophils Absolute: 0 10*3/uL (ref 0.0–0.1)
Basophils Relative: 1 %
Eosinophils Absolute: 0.1 10*3/uL (ref 0.0–0.5)
Eosinophils Relative: 3 %
HCT: 39.2 % (ref 34.8–46.6)
Hemoglobin: 12.8 g/dL (ref 11.6–15.9)
Lymphocytes Relative: 30 %
Lymphs Abs: 1.2 10*3/uL (ref 0.9–3.3)
MCH: 30.6 pg (ref 25.1–34.0)
MCHC: 32.7 g/dL (ref 31.5–36.0)
MCV: 93.8 fL (ref 79.5–101.0)
Monocytes Absolute: 0.2 10*3/uL (ref 0.1–0.9)
Monocytes Relative: 5 %
Neutro Abs: 2.5 10*3/uL (ref 1.5–6.5)
Neutrophils Relative %: 61 %
Platelets: 92 10*3/uL — ABNORMAL LOW (ref 145–400)
RBC: 4.18 MIL/uL (ref 3.70–5.45)
RDW: 17.9 % — ABNORMAL HIGH (ref 11.2–14.5)
WBC: 4.1 10*3/uL (ref 3.9–10.3)

## 2017-05-15 LAB — FERRITIN: Ferritin: 98 ng/mL (ref 9–269)

## 2017-05-15 LAB — TOTAL PROTEIN, URINE DIPSTICK: Protein, ur: NEGATIVE mg/dL

## 2017-05-15 MED ORDER — SODIUM CHLORIDE 0.9 % IV SOLN
Freq: Once | INTRAVENOUS | Status: AC
  Administered 2017-05-15: 10:00:00 via INTRAVENOUS

## 2017-05-15 MED ORDER — SODIUM CHLORIDE 0.9 % IV SOLN: Freq: Once | INTRAVENOUS | Status: DC

## 2017-05-15 MED ORDER — SODIUM CHLORIDE 0.9% FLUSH
10.0000 mL | INTRAVENOUS | Status: DC | PRN
  Administered 2017-05-15: 10 mL
  Filled 2017-05-15: qty 10

## 2017-05-15 MED ORDER — SODIUM CHLORIDE 0.9% FLUSH
10.0000 mL | Freq: Once | INTRAVENOUS | Status: AC
  Administered 2017-05-15: 10 mL
  Filled 2017-05-15: qty 10

## 2017-05-15 MED ORDER — HEPARIN SOD (PORK) LOCK FLUSH 100 UNIT/ML IV SOLN
500.0000 [IU] | Freq: Once | INTRAVENOUS | Status: AC | PRN
  Administered 2017-05-15: 500 [IU]
  Filled 2017-05-15: qty 5

## 2017-05-15 MED ORDER — SODIUM CHLORIDE 0.9 % IV SOLN
10.2000 mg/kg | Freq: Once | INTRAVENOUS | Status: AC
  Administered 2017-05-15: 900 mg via INTRAVENOUS
  Filled 2017-05-15: qty 36

## 2017-05-15 NOTE — Assessment & Plan Note (Signed)
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 She is not symptomatic.  Recommend close observation only

## 2017-05-15 NOTE — Assessment & Plan Note (Signed)
CT scan show evidence of liver cirrhosis, splenomegaly indicative of portal hypertension Liver enzymes are stable We discussed dietary modification and weight loss

## 2017-05-15 NOTE — Assessment & Plan Note (Addendum)
Blood pressure is mildly elevated today but could be due to whitecoat hypertension Urine tests show no proteinuria For now, I will continue treatment without dose adjustment and she will continue her antihypertensives as directed

## 2017-05-15 NOTE — Patient Instructions (Signed)
[facility] Discharge Instructions for Patients Receiving Chemotherapy  Today you received the following chemotherapy agents Avastin  To help prevent nausea and vomiting after your treatment, we encourage you to take your nausea medication as directed   If you develop nausea and vomiting that is not controlled by your nausea medication, call the clinic.   BELOW ARE SYMPTOMS THAT SHOULD BE REPORTED IMMEDIATELY:  *FEVER GREATER THAN 100.5 F  *CHILLS WITH OR WITHOUT FEVER  NAUSEA AND VOMITING THAT IS NOT CONTROLLED WITH YOUR NAUSEA MEDICATION  *UNUSUAL SHORTNESS OF BREATH  *UNUSUAL BRUISING OR BLEEDING  TENDERNESS IN MOUTH AND THROAT WITH OR WITHOUT PRESENCE OF ULCERS  *URINARY PROBLEMS  *BOWEL PROBLEMS  UNUSUAL RASH Items with * indicate a potential emergency and should be followed up as soon as possible.  Feel free to call the clinic should you have any questions or concerns. The clinic phone number is [phone].  Please show the CHEMO ALERT CARD at check-in to the Emergency Department and triage nurse.   

## 2017-05-15 NOTE — Patient Instructions (Signed)
Implanted Port Home Guide An implanted port is a type of central line that is placed under the skin. Central lines are used to provide IV access when treatment or nutrition needs to be given through a person's veins. Implanted ports are used for long-term IV access. An implanted port may be placed because:  You need IV medicine that would be irritating to the small veins in your hands or arms.  You need long-term IV medicines, such as antibiotics.  You need IV nutrition for a long period.  You need frequent blood draws for lab tests.  You need dialysis.  Implanted ports are usually placed in the chest area, but they can also be placed in the upper arm, the abdomen, or the leg. An implanted port has two main parts:  Reservoir. The reservoir is round and will appear as a small, raised area under your skin. The reservoir is the part where a needle is inserted to give medicines or draw blood.  Catheter. The catheter is a thin, flexible tube that extends from the reservoir. The catheter is placed into a large vein. Medicine that is inserted into the reservoir goes into the catheter and then into the vein.  How will I care for my incision site? Do not get the incision site wet. Bathe or shower as directed by your health care provider. How is my port accessed? Special steps must be taken to access the port:  Before the port is accessed, a numbing cream can be placed on the skin. This helps numb the skin over the port site.  Your health care provider uses a sterile technique to access the port. ? Your health care provider must put on a mask and sterile gloves. ? The skin over your port is cleaned carefully with an antiseptic and allowed to dry. ? The port is gently pinched between sterile gloves, and a needle is inserted into the port.  Only "non-coring" port needles should be used to access the port. Once the port is accessed, a blood return should be checked. This helps ensure that the port  is in the vein and is not clogged.  If your port needs to remain accessed for a constant infusion, a clear (transparent) bandage will be placed over the needle site. The bandage and needle will need to be changed every week, or as directed by your health care provider.  Keep the bandage covering the needle clean and dry. Do not get it wet. Follow your health care provider's instructions on how to take a shower or bath while the port is accessed.  If your port does not need to stay accessed, no bandage is needed over the port.  What is flushing? Flushing helps keep the port from getting clogged. Follow your health care provider's instructions on how and when to flush the port. Ports are usually flushed with saline solution or a medicine called heparin. The need for flushing will depend on how the port is used.  If the port is used for intermittent medicines or blood draws, the port will need to be flushed: ? After medicines have been given. ? After blood has been drawn. ? As part of routine maintenance.  If a constant infusion is running, the port may not need to be flushed.  How long will my port stay implanted? The port can stay in for as long as your health care provider thinks it is needed. When it is time for the port to come out, surgery will be   done to remove it. The procedure is similar to the one performed when the port was put in. When should I seek immediate medical care? When you have an implanted port, you should seek immediate medical care if:  You notice a bad smell coming from the incision site.  You have swelling, redness, or drainage at the incision site.  You have more swelling or pain at the port site or the surrounding area.  You have a fever that is not controlled with medicine.  This information is not intended to replace advice given to you by your health care provider. Make sure you discuss any questions you have with your health care provider. Document  Released: 01/28/2005 Document Revised: 07/06/2015 Document Reviewed: 10/05/2012 [employer] Interactive Patient Education  2017 [employer]  

## 2017-05-15 NOTE — Assessment & Plan Note (Signed)
So far, she have no clinical signs of disease progression Her tumor marker is stable I plan to continue maintenance treatment with Avastin every 2 weeks I plan to repeat CT imaging in 6 months, due in October

## 2017-05-15 NOTE — Progress Notes (Signed)
[place] OFFICE PROGRESS NOTE  Patient Care Team: Helane Rima, MD as PCP - General (Family Medicine)  ASSESSMENT & PLAN:  Primary peritoneal carcinomatosis [facility]) So far, she have no clinical signs of disease progression Her tumor marker is stable I plan to continue maintenance treatment with Avastin every 2 weeks I plan to repeat CT imaging in 6 months, due in October   Essential hypertension Blood pressure is mildly elevated today but could be due to whitecoat hypertension Urine tests show no proteinuria For now, I will continue treatment without dose adjustment and she will continue her antihypertensives as directed  Iron deficiency anemia The patient has history of recurrent iron deficiency anemia Repeat iron studies today is satisfactory We discussed the risk and benefits of not pursuing EGD or colonoscopy to rule out GI bleed or malignancy For now, she is comfortable to be monitored with iron studies once a month  Liver cirrhosis secondary to [facility]) CT scan show evidence of liver cirrhosis, splenomegaly indicative of portal hypertension Liver enzymes are stable We discussed dietary modification and weight loss  Thrombocytopenia (HCC) The intermittent leukopenia 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 She is not symptomatic.  Recommend close observation only   No orders of the defined types were placed in this encounter.   INTERVAL HISTORY: Please see below for problem oriented charting. She returns with her husband for further follow-up and review of test results She tolerated infusion well Denies recent infection No abdominal bloating, nausea, changes in bowel habits or vaginal bleeding The patient denies any recent signs or symptoms of bleeding such as spontaneous epistaxis, hematuria or hematochezia.   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  No worrisome liver mass.  There are associated portal venous collaterals and splenomegaly consistent with portal venous hypertension. 2.  No other significant upper abdominal findings      11/08/2013 Imaging    US showed ascites      11/08/2013 Initial Diagnosis    Patient had ~ 2 months of some urinary incontinence, saw Dr Ronita Hipps in 10-2013, with pelvic mass on exam       11/12/2013 Imaging    There are findings of progressive hepatic cirrhosis with a large amount of ascites and mild splenomegaly. There are venous collaterals present. 2. There is abnormal thickening of the peritoneal surface along the left mid and lower abdominal wall. There is abnormal soft tissue density in the pelvis as well which could reflect the clinically suspected ovarian malignancy but the findings are nondiagnostic. 3. There is a new small left pleural effusion. 4. There is no acute bowel abnormality.      11/18/2013 Imaging    Successful ultrasound guided diagnostic and therapeutic paracentesis yielding 4.1 liters of ascites.       11/18/2013 Pathology Results    PERITONEAL/ASCITIC FLUID (SPECIMEN 1 OF 1 COLLECTED 11/18/13): SEROUS CARCINOMA, PLEASE SEE COMMENT.      12/01/2013 Tumor Marker    Patient's tumor was tested for the following markers: CA125 Results of the tumor marker test revealed 1938      12/03/2013 Imaging    Successful ultrasound-guided therapeutic paracentesis yielding 3.6 liters of peritoneal fluid.      12/07/2013 - 05/30/2014 Chemotherapy    She received 3 cycles of carboplatin and Taxol, interrupted  cycle 4 for surgery.  She subsequently completed 3 more cycles of chemotherapy after surgery      12/20/2013 Imaging    Successful ultrasound-guided diagnostic and therapeutic paracentesis yielding 2.2 liters of peritoneal fluid      12/20/2013 Pathology  Results    PERITONEAL/ASCITIC FLUID(SPECIMEN 1 OF 1 COLLECTED 12/20/13): MALIGNANT CELLS CONSISTENT WITH SEROUS CARCINOMA      01/13/2014 Tumor Marker    Patient's tumor was tested for the following markers: CA125 Results of the tumor marker test revealed 227      02/07/2014 Tumor Marker    Patient's tumor was tested for the following markers: CA125 Results of the tumor marker test revealed 130      02/15/2014 Genetic Testing    Genetics testing from 02-2014 normal ([employer] panel)      02/23/2014 Imaging    Interval decrease in ascites. 2. Morphologic changes in the liver consistent with cirrhosis. 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 Portal vein remains patent at this time. 3. Persistent but decreased abnormal soft tissue attenuation tracking in the omental fat. This may be secondary to interval improvement in metastatic disease. 4. Peritoneal thickening seen in the left abdomen and pelvis on the previous study has decreased and nearly resolved in the interval. This also suggests interval improvement in metastatic disease.       03/07/2014 Procedure    Ultrasound and fluoroscopically guided right internal jugular single lumen power port catheter insertion. Tip in the SVC/RA junction. Catheter ready for use.      03/17/2014 Tumor Marker    Patient's tumor was tested for the following markers: CA125 Results of the tumor marker test revealed 45      03/29/2014 Pathology Results    1. Omentum, resection for tumor - HIGH GRADE SEROUS CARCINOMA, SEE COMMENT. 2. Ovary and fallopian tube, right - HIGH GRADE SEROUS CARCINOMA, SEE COMMENT. 3. Ovary and fallopian tube, left - HIGH GRADE SEROUS CARCINOMA, SEE COMMENT. Diagnosis Note 1. Nests and clusters of malignant cells are invading the omental tissue (part #1) with associated fibrosis. The cells are pleomorphic with prominent nucleoli. There are scattered psammoma bodies. The ovaries are atrophic and  exhibit multiple foci of surface based invasive carcinoma and associated fibrosis. The fallopian tubes have a few foci of carcinoma, also superficially located. There are no precursor lesions noted in the ovaries or fallopian tubes (the entire tubes and ovaries were submitted for evaluation). While there is some retraction artifact, there are several foci suspicious for lymphovascular invasion. 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 Given the fibrosis, there does appear to be a small amount of treatment effect, however, there is abundant residual tumor.       03/29/2014 Surgery    Procedure(s) Performed: 1. Exploratory laparotomy with bilateral salpingo-oophorectomy, omentectomy radical tumor debulking for ovarian cancer .  Surgeon: Thereasa Solo, MD.  Assistant Surgeon: Lahoma Crocker, M.D. Assistant: (an MD assistant was necessary for tissue manipulation, retraction and positioning due to the complexity of the case and hospital policies).  Operative Findings: 10cm omental cake from hepatic to splenic flexure, densely adherent to transverse colon. Milliary studding of tumor implants (<46m, too numerous in number to count) adherent to the mesentery of the small bowel and small bowel wall. Small volume (100cc) ascites. Small ovaries bilaterally, densely adherent to the pelvic cul de sac. Left ovary and  tube densely adherent to sigmoid colon. Cirrhotic liver with hepatomegaly and splenomegaly.     This represented an optimal cytoreduction (R1) with visible disease residual on the bowel wall and mesentery (millial, <14m implants).        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 2. 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       08/18/2014 Tumor Marker    Patient's tumor was tested for the following markers: CA125 Results of the tumor marker test revealed 16      10/13/2014 Tumor Marker    Patient's tumor was tested for the following markers: CA125 Results of the tumor marker test revealed 14      11/21/2014 Tumor Marker    Patient's tumor was tested for the following markers: CA125 Results of the tumor marker test revealed 16      12/28/2014 Tumor Marker    Patient's tumor was tested for the following markers: CA125 Results of the tumor marker test revealed 762      02/27/2015 Tumor Marker    Patient's tumor was tested for the following markers: CA125 Results of the tumor marker test revealed 20.2      03/22/2015 Imaging    Filler appearance to the prior exam, with very subtle fluid tracking along portions of the liver, and very minimal nodularity along the right paracolic gutter representing residua from the prior peritoneal cancer. The current abnormalities could simply be post therapy findings rather than necessarily representing residual malignancy. No new or enlarging lesions are identified. 2. Hepatic cirrhosis and splenomegaly. There is some gastric varices suggesting portal venous hypertension. 3. Left foraminal stenosis at L4-5 due to spurring. There is likely also some central narrowing of the thecal sac at this level. 4. Bibasilar scarring. 5. Chronic mild left mid kidney scarring      03/27/2015 Tumor Marker    Patient's tumor was tested for the following markers: CA125 Results of the tumor marker test revealed 25.3       04/24/2015 Imaging    Disc bulge L2-3 with mild spinal stenosis and right foraminal encroachment. 2. Disc bulge L3-4 with mild spinal stenosis. 3. Multifactorial spinal, left lateral recess and foraminal stenosis L4-5. There is grade 1 anterolisthesis without evident dynamic instability.      05/29/2015 Tumor Marker    Patient's tumor was tested for the following markers: CA125 Results of the tumor marker test revealed 29.9      08/21/2015 Tumor Marker    Patient's tumor was tested for the following markers: CA125 Results of the tumor marker test revealed 45      09/18/2015 Tumor Marker    Patient's tumor was tested for the following markers: CA125 Results of the tumor marker test revealed 47      09/27/2015 Imaging    New small bowel mesenteric nodule and enlarged left external iliac lymph node, highly worrisome for metastatic disease. 2. Cirrhosis and splenomegaly  10/10/2015 Tumor Marker    Patient's tumor was tested for the following markers: CA125 Results of the tumor marker test revealed 53.4      10/10/2015 - 12/11/2015 Chemotherapy    She received 4 cycles of carboplatin only      11/20/2015 Tumor Marker    Patient's tumor was tested for the following markers: CA125 Results of the tumor marker test revealed 41.3      12/20/2015 Imaging    CT: Mild left external iliac lymphadenopathy is slightly decreased. Mildly enlarged lower mesenteric nodule is slightly decreased. 2. No new or progressive metastatic disease in the abdomen or pelvis. 3. Cirrhosis. Stable subcentimeter hypodense left liver lobe lesion. No new liver lesions. 4. Stable mild splenomegaly.  No ascites. 5. Mild sigmoid diverticulosis.      01/01/2016 -  Anti-estrogen oral therapy    She was placed on tamoxifen. Had letrozole initially but switched to tamoxifen due to poor tolerance      01/15/2016 Tumor Marker    Patient's tumor was tested for the following markers: CA125 Results of the tumor marker  test revealed 31.4      02/19/2016 Tumor Marker    Patient's tumor was tested for the following markers: CA125 Results of the tumor marker test revealed 36.5      05/13/2016 Imaging    No evidence of disease progression within the abdomen or pelvis. Previously noted small central mesenteric nodule and left external iliac node appear slightly smaller. 2. Stable changes of hepatic cirrhosis and portal hypertension with associated splenomegaly. No new or enlarging hepatic lesions are identified. 3. No acute findings.      05/13/2016 Tumor Marker    Patient's tumor was tested for the following markers: CA125 Results of the tumor marker test revealed 41.2      06/24/2016 Tumor Marker    Patient's tumor was tested for the following markers: CA125 Results of the tumor marker test revealed 47.3      08/20/2016 Imaging    1. The left external iliac lymph node is slightly increased in size compared to the prior exam, currently 1.1 cm and previously 0.9 cm in diameter. 2. Stable central mesenteric lymph node at 0.9 cm in diameter. 3. Stable trace free pelvic fluid and stable slight thickening along the right paracolic gutter, without well-defined peritoneal nodularity. 4. Other imaging findings of potential clinical significance: Subsegmental atelectasis or scarring in the lung bases. Hepatic cirrhosis. Left renal scarring. Mild splenomegaly. Sigmoid colon diverticulosis. Impingement at L4-5 due to spondylosis and degenerative disc disease broad Schmorl' s nodes at L3-L4. Pelvic floor laxity      08/23/2016 Tumor Marker    Patient's tumor was tested for the following markers: CA125 Results of the tumor marker test revealed 80.2      09/04/2016 Imaging    LV EF: 60% -   65%      09/12/2016 Tumor Marker    Patient's tumor was tested for the following markers: CA125 Results of the tumor marker test revealed 98.5      09/12/2016 -  Chemotherapy    The patient received Doxil and Avastin.        09/26/2016 Tumor Marker    Patient's tumor was tested for the following markers: CA125 Results of the tumor marker test revealed 68.9      10/10/2016 Tumor Marker    Patient's tumor was tested for the following markers: CA125 Results of the tumor marker test revealed 65.6      11/07/2016  Tumor Marker    Patient's tumor was tested for the following markers: CA125 Results of the tumor marker test revealed 46.4      11/29/2016 Imaging    Stable mild peritoneal thickening, suspicious for peritoneal carcinomatosis. No new or progressive disease identified. No evidence of ascites.  No significant change in 10 mm left external iliac and 8 mm mesenteric lymph nodes.  Stable hepatic cirrhosis, and splenomegaly consistent with portal venous hypertension.  Colonic diverticulosis, without radiographic evidence of diverticulitis.      12/02/2016 Imaging    Normal LV size with EF 55%. Basal inferior and basal inferoseptal hypokinesis. Normal RV size and systolic function. No significant valvular abnormalities.      12/05/2016 Tumor Marker    Patient's tumor was tested for the following markers: CA125 Results of the tumor marker test revealed 49.1      01/09/2017 Tumor Marker    Patient's tumor was tested for the following markers: CA125 Results of the tumor marker test revealed 47.2      02/06/2017 Tumor Marker    Patient's tumor was tested for the following markers: CA125 Results of the tumor marker test revealed 44.1      02/18/2017 Imaging    Stable mild peritoneal thickening. No new or progressive disease identified within the abdomen or pelvis.  Stable tiny sub-cm left external iliac and mesenteric lymph nodes.  Stable hepatic cirrhosis, and splenomegaly consistent with portal venous hypertension. No evidence of hepatic neoplasm.  Colonic diverticulosis, without radiographic evidence of diverticulitis.      03/06/2017 Tumor Marker    Patient's tumor was tested for the  following markers: CA125 Results of the tumor marker test revealed 50.4      03/17/2017 Imaging    - Left ventricle: The cavity size was normal. Wall thickness was normal. Systolic function was normal. The estimated ejection fraction was in the range of 55% to 60%. Wall motion was normal; there were no regional wall motion abnormalities. Features are consistent with a pseudonormal left ventricular filling pattern, with concomitant abnormal relaxation and increased filling pressure (grade 2 diastolic dysfunction). - Mitral valve: There was mild regurgitation. - Left atrium: The atrium was mildly dilated      04/03/2017 Tumor Marker    Patient's tumor was tested for the following markers: CA125 Results of the tumor marker test revealed 47.2      05/14/2017 Imaging    CT scan of abdomen and pelvis Stable mild peritoneal thickening. No new or progressive disease within the abdomen or pelvis.  Stable hepatic cirrhosis. Stable splenomegaly, consistent with portal venous hypertension. No evidence of hepatic neoplasm.       REVIEW OF SYSTEMS:   Constitutional: Denies fevers, chills or abnormal weight loss Eyes: Denies blurriness of vision Ears, nose, mouth, throat, and face: Denies mucositis or sore throat Respiratory: Denies cough, dyspnea or wheezes Cardiovascular: Denies palpitation, chest discomfort or lower extremity swelling Gastrointestinal:  Denies nausea, heartburn or change in bowel habits Skin: Denies abnormal skin rashes Lymphatics: Denies new lymphadenopathy or easy bruising Neurological:Denies numbness, tingling or new weaknesses Behavioral/Psych: Mood is stable, no new changes  All other systems were reviewed with the patient and are negative.  I have reviewed the past medical history, past surgical history, social history and family history with the patient and they are unchanged from previous note.  ALLERGIES:  is allergic to shellfish allergy; benadryl [diphenhydramine  hcl]; penicillins; and tape.  MEDICATIONS:  Current Outpatient Medications  Medication Sig Dispense Refill  .  amLODipine (NORVASC) 10 MG tablet Take 1 tablet (10 mg total) by mouth daily. 30 tablet 11  . aspirin EC 81 MG tablet Take 81 mg by mouth daily.    . bisoprolol (ZEBETA) 10 MG tablet Take 1 tablet (10 mg total) by mouth daily. 30 tablet 11  . cholecalciferol (VITAMIN D) 1000 units tablet Take 1,000 Units by mouth daily.    Marland Kitchen gabapentin (NEURONTIN) 300 MG capsule TAKE 2 CAPSULES BY MOUTH THREE TIMES DAILY 120 capsule 0  . HYDROcodone-acetaminophen (NORCO/VICODIN) 5-325 MG tablet take 1 tablet by mouth every 6 hours if needed for MODERATE PAIN 60 tablet 0  . lidocaine-prilocaine (EMLA) cream Apply to Porta-cath  1-2 hours prior to access as directed. 30 g 1  . metFORMIN (GLUCOPHAGE-XR) 500 MG 24 hr tablet Take 1,000 mg by mouth 2 (two) times daily.   0  . ondansetron (ZOFRAN) 8 MG tablet Take 1 tablet (8 mg total) by mouth every 8 (eight) hours as needed for nausea. 30 tablet 3  . pantoprazole (PROTONIX) 40 MG tablet Take 40 mg by mouth daily.   0  . promethazine (PHENERGAN) 25 MG tablet Take 1 tablet (25 mg total) by mouth every 6 (six) hours as needed for nausea. 30 tablet 3  . rotigotine (NEUPRO) 4 MG/24HR Place 1 patch onto the skin daily. 90 patch 1   No current facility-administered medications for this visit.    Facility-Administered Medications Ordered in Other Visits  Medication Dose Route Frequency Provider Last Rate Last Dose  . 0.9 %  sodium chloride infusion   Intravenous Once Rafal Archuleta, MD      . sodium chloride flush (NS) 0.9 % injection 10 mL  10 mL Intracatheter PRN Alvy Bimler, Milledge Gerding, MD   10 mL at 05/15/17 1130    PHYSICAL EXAMINATION: ECOG PERFORMANCE STATUS: 1 - Symptomatic but completely ambulatory  Vitals:   05/15/17 0912  BP: (!) 145/69  Pulse: 60  Resp: 18  Temp: 97.8 F (36.6 C)  SpO2: 100%   Filed Weights   05/15/17 0912  Weight: 190 lb 4.8 oz (86.3  kg)    GENERAL:alert, no distress and comfortable SKIN: skin color, texture, turgor are normal, no rashes or significant lesions EYES: normal, Conjunctiva are pink and non-injected, sclera clear OROPHARYNX:no exudate, no erythema and lips, buccal mucosa, and tongue normal  NECK: supple, thyroid normal size, non-tender, without nodularity LYMPH:  no palpable lymphadenopathy in the cervical, axillary or inguinal LUNGS: clear to auscultation and percussion with normal breathing effort HEART: regular rate & rhythm and no murmurs and no lower extremity edema ABDOMEN:abdomen soft, non-tender and normal bowel sounds Musculoskeletal:no cyanosis of digits and no clubbing  NEURO: alert & oriented x 3 with fluent speech, no focal motor/sensory deficits  LABORATORY DATA:  I have reviewed the data as listed    Component Value Date/Time   NA 143 05/15/2017 0853   NA 142 02/06/2017 0742   K 4.0 05/15/2017 0853   K 3.9 02/06/2017 0742   CL 109 05/15/2017 0853   CO2 24 05/15/2017 0853   CO2 22 02/06/2017 0742   GLUCOSE 114 05/15/2017 0853   GLUCOSE 156 (H) 02/06/2017 0742   BUN 10 05/15/2017 0853   BUN 13.5 02/06/2017 0742   CREATININE 0.74 05/15/2017 0853   CREATININE 0.8 02/06/2017 0742   CALCIUM 9.1 05/15/2017 0853   CALCIUM 8.5 02/06/2017 0742   PROT 7.2 05/15/2017 0853   PROT 6.9 02/06/2017 0742   ALBUMIN 3.9 05/15/2017 0853  ALBUMIN 3.7 02/06/2017 0742   AST 42 (H) 05/15/2017 0853   AST 47 (H) 02/06/2017 0742   ALT 42 05/15/2017 0853   ALT 54 02/06/2017 0742   ALKPHOS 91 05/15/2017 0853   ALKPHOS 130 02/06/2017 0742   BILITOT 0.8 05/15/2017 0853   BILITOT 0.65 02/06/2017 0742   GFRNONAA >60 05/15/2017 0853   GFRAA >60 05/15/2017 0853    No results found for: SPEP, UPEP  Lab Results  Component Value Date   WBC 4.1 05/15/2017   NEUTROABS 2.5 05/15/2017   HGB 12.8 05/15/2017   HCT 39.2 05/15/2017   MCV 93.8 05/15/2017   PLT 92 (L) 05/15/2017      Chemistry       Component Value Date/Time   NA 143 05/15/2017 0853   NA 142 02/06/2017 0742   K 4.0 05/15/2017 0853   K 3.9 02/06/2017 0742   CL 109 05/15/2017 0853   CO2 24 05/15/2017 0853   CO2 22 02/06/2017 0742   BUN 10 05/15/2017 0853   BUN 13.5 02/06/2017 0742   CREATININE 0.74 05/15/2017 0853   CREATININE 0.8 02/06/2017 0742      Component Value Date/Time   CALCIUM 9.1 05/15/2017 0853   CALCIUM 8.5 02/06/2017 0742   ALKPHOS 91 05/15/2017 0853   ALKPHOS 130 02/06/2017 0742   AST 42 (H) 05/15/2017 0853   AST 47 (H) 02/06/2017 0742   ALT 42 05/15/2017 0853   ALT 54 02/06/2017 0742   BILITOT 0.8 05/15/2017 0853   BILITOT 0.65 02/06/2017 0742       RADIOGRAPHIC STUDIES: I have personally reviewed the radiological images as listed and agreed with the findings in the report. Ct Abdomen Pelvis W Contrast  Result Date: 05/14/2017 CLINICAL DATA:  Followup ovarian carcinoma with peritoneal carcinomatosis. Thrombocytopenia. Restaging. EXAM: CT ABDOMEN AND PELVIS WITH CONTRAST TECHNIQUE: Multidetector CT imaging of the abdomen and pelvis was performed using the standard protocol following bolus administration of intravenous contrast. CONTRAST:  138m ISOVUE-300 IOPAMIDOL (ISOVUE-300) INJECTION 61% COMPARISON:  02/18/2017 FINDINGS: Lower Chest: No acute findings. Hepatobiliary: No hepatic masses identified. Hepatic cirrhosis again demonstrated. Gallbladder is unremarkable. Pancreas:  No mass or inflammatory changes. Spleen: Stable splenomegaly measuring 15 cm, consistent with portal venous hypertension. No splenic masses identified. Adrenals/Urinary Tract: No masses identified. Stable mild left renal scarring. No evidence of hydronephrosis. Unremarkable unopacified urinary bladder. Stomach/Bowel: No evidence of obstruction, inflammatory process or abnormal fluid collections. Vascular/Lymphatic: No pathologically enlarged lymph nodes. Tiny sub-cm mesenteric and left iliac lymph nodes remain stable. No  abdominal aortic aneurysm. Reproductive: Prior hysterectomy noted. Adnexal regions are unremarkable in appearance. Mild peritoneal thickening in the dependent pelvis and mesenteric soft tissue stranding appears stable. No evidence of ascites. Other:  None. Musculoskeletal:  No suspicious bone lesions identified. IMPRESSION: Stable mild peritoneal thickening. No new or progressive disease within the abdomen or pelvis. Stable hepatic cirrhosis. Stable splenomegaly, consistent with portal venous hypertension. No evidence of hepatic neoplasm. Electronically Signed   By: JEarle GellM.D.   On: 05/14/2017 14:43    All questions were answered. The patient knows to call the clinic with any problems, questions or concerns. No barriers to learning was detected.  I spent 20 minutes counseling the patient face to face. The total time spent in the appointment was 25 minutes and more than 50% was on counseling and review of test results  NHeath Lark MD 05/15/2017 12:38 PM

## 2017-05-15 NOTE — Assessment & Plan Note (Signed)
The patient has history of recurrent iron deficiency anemia Repeat iron studies today is satisfactory We discussed the risk and benefits of not pursuing EGD or colonoscopy to rule out GI bleed or malignancy For now, she is comfortable to be monitored with iron studies once a month

## 2017-05-15 NOTE — Telephone Encounter (Signed)
Patient AVS and calendar of upcoming April and May appointments.

## 2017-05-16 LAB — CA 125: Cancer Antigen (CA) 125: 56.1 U/mL — ABNORMAL HIGH (ref 0.0–38.1)

## 2017-05-29 ENCOUNTER — Inpatient Hospital Stay: Payer: Medicare Other

## 2017-05-29 VITALS — BP 137/67 | HR 59 | Temp 98.1°F | Resp 18

## 2017-05-29 DIAGNOSIS — C482 Malignant neoplasm of peritoneum, unspecified: Secondary | ICD-10-CM

## 2017-05-29 DIAGNOSIS — C569 Malignant neoplasm of unspecified ovary: Secondary | ICD-10-CM

## 2017-05-29 DIAGNOSIS — Z5112 Encounter for antineoplastic immunotherapy: Secondary | ICD-10-CM | POA: Diagnosis not present

## 2017-05-29 LAB — COMPREHENSIVE METABOLIC PANEL
ALT: 37 U/L (ref 0–55)
AST: 41 U/L — ABNORMAL HIGH (ref 5–34)
Albumin: 3.9 g/dL (ref 3.5–5.0)
Alkaline Phosphatase: 95 U/L (ref 40–150)
Anion gap: 12 — ABNORMAL HIGH (ref 3–11)
BUN: 19 mg/dL (ref 7–26)
CO2: 24 mmol/L (ref 22–29)
Calcium: 9.5 mg/dL (ref 8.4–10.4)
Chloride: 107 mmol/L (ref 98–109)
Creatinine, Ser: 0.82 mg/dL (ref 0.60–1.10)
GFR calc Af Amer: >60 mL/min (ref >60)
GFR calc non Af Amer: >60 mL/min (ref >60)
Glucose, Bld: 130 mg/dL (ref 70–140)
Potassium: 4 mmol/L (ref 3.5–5.1)
Sodium: 143 mmol/L (ref 136–145)
Total Bilirubin: 0.7 mg/dL (ref 0.2–1.2)
Total Protein: 7.4 g/dL (ref 6.4–8.3)

## 2017-05-29 LAB — CBC WITH DIFFERENTIAL/PLATELET
Basophils Absolute: 0 10*3/uL (ref 0.0–0.1)
Basophils Relative: 1 %
Eosinophils Absolute: 0.1 10*3/uL (ref 0.0–0.5)
Eosinophils Relative: 3 %
HCT: 39.7 % (ref 34.8–46.6)
Hemoglobin: 13.1 g/dL (ref 11.6–15.9)
Lymphocytes Relative: 27 %
Lymphs Abs: 1.3 10*3/uL (ref 0.9–3.3)
MCH: 30.2 pg (ref 25.1–34.0)
MCHC: 33.1 g/dL (ref 31.5–36.0)
MCV: 91.3 fL (ref 79.5–101.0)
Monocytes Absolute: 0.2 10*3/uL (ref 0.1–0.9)
Monocytes Relative: 5 %
Neutro Abs: 3.1 10*3/uL (ref 1.5–6.5)
Neutrophils Relative %: 64 %
Platelets: 80 10*3/uL — ABNORMAL LOW (ref 145–400)
RBC: 4.34 MIL/uL (ref 3.70–5.45)
RDW: 18.5 % — ABNORMAL HIGH (ref 11.2–14.5)
WBC: 4.7 10*3/uL (ref 3.9–10.3)

## 2017-05-29 MED ORDER — SODIUM CHLORIDE 0.9% FLUSH
10.0000 mL | Freq: Once | INTRAVENOUS | Status: AC
  Administered 2017-05-29: 10 mL
  Filled 2017-05-29: qty 10

## 2017-05-29 MED ORDER — SODIUM CHLORIDE 0.9 % IV SOLN
10.2000 mg/kg | Freq: Once | INTRAVENOUS | Status: AC
  Administered 2017-05-29: 900 mg via INTRAVENOUS
  Filled 2017-05-29: qty 32

## 2017-05-29 MED ORDER — SODIUM CHLORIDE 0.9 % IV SOLN
Freq: Once | INTRAVENOUS | Status: AC
  Administered 2017-05-29: 08:00:00 via INTRAVENOUS

## 2017-05-29 MED ORDER — SODIUM CHLORIDE 0.9% FLUSH
10.0000 mL | INTRAVENOUS | Status: DC | PRN
  Administered 2017-05-29: 10 mL
  Filled 2017-05-29: qty 10

## 2017-05-29 MED ORDER — HEPARIN SOD (PORK) LOCK FLUSH 100 UNIT/ML IV SOLN
500.0000 [IU] | Freq: Once | INTRAVENOUS | Status: AC | PRN
  Administered 2017-05-29: 500 [IU]
  Filled 2017-05-29: qty 5

## 2017-05-29 NOTE — Patient Instructions (Signed)
[facility] Discharge Instructions for Patients Receiving Chemotherapy  Today you received the following chemotherapy agents Avastin  To help prevent nausea and vomiting after your treatment, we encourage you to take your nausea medication as directed   If you develop nausea and vomiting that is not controlled by your nausea medication, call the clinic.   BELOW ARE SYMPTOMS THAT SHOULD BE REPORTED IMMEDIATELY:  *FEVER GREATER THAN 100.5 F  *CHILLS WITH OR WITHOUT FEVER  NAUSEA AND VOMITING THAT IS NOT CONTROLLED WITH YOUR NAUSEA MEDICATION  *UNUSUAL SHORTNESS OF BREATH  *UNUSUAL BRUISING OR BLEEDING  TENDERNESS IN MOUTH AND THROAT WITH OR WITHOUT PRESENCE OF ULCERS  *URINARY PROBLEMS  *BOWEL PROBLEMS  UNUSUAL RASH Items with * indicate a potential emergency and should be followed up as soon as possible.  Feel free to call the clinic should you have any questions or concerns. The clinic phone number is [phone].  Please show the CHEMO ALERT CARD at check-in to the Emergency Department and triage nurse.   

## 2017-05-29 NOTE — Progress Notes (Signed)
Okay to treat with platelets of 80 per Dr. Alvy Bimler.

## 2017-06-05 ENCOUNTER — Other Ambulatory Visit: Payer: Self-pay | Admitting: Hematology and Oncology

## 2017-06-09 NOTE — Progress Notes (Signed)
Olivia Benson was seen today in neurologic consultation at the request of Dr. Marko Plume. Her PCP is Helane Rima, MD.  The consultation is for RLS.  Prior records were reviewed.  The patient also has a history of primary peritoneal carcinomatosis, grade 3.  She has undergone previous debulking surgery and chemotherapy with carboplatin and Taxol, which was last completed on 05/30/2014.  Her chemotherapy has been complicated by neutropenia, peripheral neuropathy, symptomatically anemia and fatigue.  She presents today to discuss restless leg syndrome.  Pt states that her sx's consist of a crawling sensation on the legs; she feels that she has a spider web on the leg; "I just have to move."  It may have started pre-chemo but got much worse after chemo.  It starts mid afternoon once she sits and "gets quiet" and tries to watch TV.    She has previously tried flexeril, baclofen, ultram, restoril and oxycontin.  The only one that has been helpful is oxyIR.  She has never been on requip, mirapex, levodopa, klonopin.    She admits that she craves ice.  She has been anemic.  Her last Hgb was 10.6.    11/25/14 update:  The patient is following up today in regards to her restless leg syndrome that is likely secondary to iron deficiency anemia.  Last visit, I started her on pramipexole, 0.125 mg after dinner and 0.25 mg at night. When she wakes up at night, she has "bone pain" but no RLS sx's.  She is still on one oxycodone at night for the pain but has dropped that from 2 tablets.   I was able to get a copy of her TSH from January, 2015 and it was normal at 1.820.  Dr. Marko Plume did start her on oral iron after our last visit but unfortunately the patient did not tolerate that because of nausea and it was recommended that the patient try instead a multivitamin with iron and she couldn't tolerate that either.  Said that she actually tolerated oral iron better than MVI.  Said that her ice craving went away.  05/24/15  update:  The patient follows up today in regards to restless leg syndrome.  She is on pramipexole, 0.125 mg after dinner and 0.25 mg at night.  She has been worse but isn't sure if that is because she is no longer on her oxycontin.  Admits she isn't taking her iron supplements.   She isn't sleeping at all now.  Eating ice again.  She did get married since our last visit, but reports that she has been with the same gentleman for a long time.  States that her weight and BS have both been going up and she is frustrated with that.  She has had some arthritic pain and the indocin helps but she had some stomach pain.  07/28/15 update:  The patient is seen today in follow-up for restless leg syndrome, which has been secondary to iron deficiency.  Last visit, she was complaining about worsening symptoms and increased her pramipexole 0.5 mg, so that she was taking a half a tablet after dinner and one tablet at night.  She complains that this medication makes her cough and causes SOB so she only takes 1/2 after dinner and 1/2 at night. Last visit, I  also checked her iron, and the percent saturation was low at 7% and total iron was low at 34.  I told her that she absolutely needed to restart her iron supplements.  She  states that she is taking this and "it isn't helping and my cravings for ice is increasing."  Admits that she overall isn't feeling good.  "I have pain all over."  No taking diabetes meds as prescribed.    11/27/15 update:  Pt f/u today for RLS.  She thought that mirapex caused SOB and cough and although that would be very unusual, we d/c that last visit and changed it to neupro patch.  I thought that her iron was likely low last visit and I rechecked it, and it indeed was low.  Through Dr. Marko Plume, IV iron was arranged for the patient.  She felt that she did amazing after the 2nd infusion of iron.  She is now off of the iron.  She is also off of the patch because she is in the donut hole.  She quit the IV  iron because she started chemo.  She is starting to crave ice again but not like she was last visit and not SOB like she was before last visit. The records that were made available to me were reviewed.  Unfortunately, pt had recurrence of her primary peritoneal carcinomatosis since our last visit.  Her chemo was restarted.  04/02/16 update: Patient seen today in follow-up.  She was given samples of the Neupro patch last visit, 1 mg for her restless leg.  She had repeat ferritin and iron studies on 02/19/2016 and were unremarkable.  States that she really was doing well until started on tamoxifen and then the RLS started acting up.  She is having cramping of the legs as well and has been taking a significant amount of advil (8-12 per day).  07/31/16 update:  Patient in today in follow-up.  Her Neupro patch was increased last visit her restless leg to 2 mg daily.  She states today that she didn't think that it was helping but she forgot it one day and then "I paced the floors and cried."  Has been f/u with Dr. Alvy Bimler and I reviewed those records.  No tumor progression.  Tumors markers fluctuated somewhat.  On tamoxifen.  She did get rid of her advil but is taking hydrocodone.  She is still craving ice but cannot tolerate the iron supplements so isn't taking supplements  12/04/16 update: Patient is seen today in follow-up.  Patient has a history of restless leg syndrome secondary to iron deficiency anemia.  She is on the Neupro patch, which was increased to 4 mg daily last visit.  "That is working better."  She forgot it one day and "I wanted to hurt someone."   Dr. Alvy Bimler ordered ferritin, iron and TIBC but I do not see that those have been completed yet.  Pt states that "I will have them done tomorrow because I am eating ice again."  Started chemo in August and RLS sx's got worse.  Eating so much ice that has separate ice machine.  Is taking much less ibuprofen now.    06/11/17 update: Patient is seen today in  follow-up.  She has restless leg secondary to iron deficiency anemia.  Most recent iron stores (ferritin/iron/TIBC) were done on May 15, 2017 and were normal.  She is on Neupro patch, 4 mg daily.  "I have really wiggly days and days its not so bad."  On not so bad days, she may not wear patch but sx's will come back.  No site reactions.  Only "bad reaction" is the cost.  Records have been reviewed since our last  visit.  She was last seen by Dr. Alvy Bimler on May 15, 2017.  She has had no signs of disease progression clinically with her peritoneal carcinomatosis.  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  It is felt that her intermittent leukopenia and chronic thrombocytopenia is likely due to liver disease/splenomegaly.   PREVIOUS MEDICATIONS: n/a  ALLERGIES:   Allergies  Allergen Reactions  . Shellfish Allergy Anaphylaxis    HER THROAT SWELLS UP AND CLOSES  . Benadryl [Diphenhydramine Hcl] Other (See Comments)    Restless legs   . Penicillins Rash    WHEN SHE WAS TEENAGER  . Tape Itching    Red itch with some tapes    CURRENT MEDICATIONS:  Outpatient Encounter Medications as of 06/11/2017  Medication Sig  . bisoprolol (ZEBETA) 10 MG tablet Take 1 tablet (10 mg total) by mouth daily.  . cholecalciferol (VITAMIN D) 1000 units tablet Take 1,000 Units by mouth daily.  Marland Kitchen gabapentin (NEURONTIN) 300 MG capsule TAKE 2 CAPSULES BY MOUTH THREE TIMES DAILY (Patient taking differently: TAKE 2 CAPSULES twice daily)  . hydrochlorothiazide (MICROZIDE) 12.5 MG capsule Take 12.5 mg by mouth daily.  Marland Kitchen HYDROcodone-acetaminophen (NORCO/VICODIN) 5-325 MG tablet take 1 tablet by mouth every 6 hours if needed for MODERATE PAIN  . lidocaine-prilocaine (EMLA) cream Apply to Porta-cath  1-2 hours prior to access as directed.  . metFORMIN (GLUCOPHAGE-XR) 500 MG 24 hr tablet Take 1,000 mg by mouth 2 (two) times daily.   Marland Kitchen olmesartan (BENICAR) 20 MG tablet Take 20 mg by mouth daily.  . ondansetron  (ZOFRAN) 8 MG tablet Take 1 tablet (8 mg total) by mouth every 8 (eight) hours as needed for nausea.  . pantoprazole (PROTONIX) 40 MG tablet Take 40 mg by mouth daily.   . promethazine (PHENERGAN) 25 MG tablet Take 1 tablet (25 mg total) by mouth every 6 (six) hours as needed for nausea.  . rotigotine (NEUPRO) 4 MG/24HR Place 1 patch onto the skin daily.  . [DISCONTINUED] amLODipine (NORVASC) 10 MG tablet Take 1 tablet (10 mg total) by mouth daily.  . [DISCONTINUED] aspirin EC 81 MG tablet Take 81 mg by mouth daily.   No facility-administered encounter medications on file as of 06/11/2017.     PAST MEDICAL HISTORY:   Past Medical History:  Diagnosis Date  . Asthma   . Cirrhosis ([place])   . Complication of anesthesia    slow to wake up / n & v  . Diabetes mellitus   . Elevated LFTs   . Family history of adverse reaction to anesthesia    multiple family members have had problems such as slow to wake / "flat lined" /  ventilator    . Frequency of urination   . Glaucoma, narrow-angle   . History of skin cancer   . Hypertension   . Non-ST elevated myocardial infarction (non-STEMI) [facility]) Sept 2012   Mild elevation in troponin and CK MB but no evidence of coronary disease at time of cath  . Obesity   . Ovarian cancer ([place]) 11/22/2013   Disseminated ovarian cancer  . Overactive bladder   . PONV (postoperative nausea and vomiting)   . Portal hypertension ([place])   . Restless leg syndrome   . Tingling    hands and feet    PAST SURGICAL HISTORY:   Past Surgical History:  Procedure Laterality Date  . ABDOMINAL ULTRASOUND  Sept 2012   No gallbladder disease, + fatty liver  . CARDIAC CATHETERIZATION  10/25/2010   EF 60%; Normal coronaries  . COLONOSCOPY  10/18/2005  . COSMETIC SURGERY  2003  . DENTAL SURGERY    . EYE SURGERY Bilateral 09/19/14 R eye, 08/2014 L eye   open angles in both eyes d/t narrow angle glaucoma  . LAPAROTOMY N/A 03/29/2014   Procedure: EXPLORATORY LAPAROTOMY;  Surgeon:  Everitt Amber, MD;  Location: [facility];  Service: Gynecology;  Laterality: N/A;  . PARTIAL HYSTERECTOMY  1988  . SALPINGOOPHORECTOMY Bilateral 03/29/2014   Procedure: BILATERAL SALPINGO OOPHORECTOMY, OMENTECTOMY, DEBULKING;  Surgeon: Everitt Amber, MD;  Location: [facility];  Service: Gynecology;  Laterality: Bilateral;  . TONSILLECTOMY  1971  . TUBAL LIGATION    . [facility]  11/24/2009    SOCIAL HISTORY:   Social History   Socioeconomic History  . Marital status: Married    Spouse name: Not on file  . Number of children: 1 D  . Years of education: Not on file  . Highest education level: Not on file  Occupational History  . Occupation: Retired  Scientific laboratory technician  . Financial resource strain: Not on file  . Food insecurity:    Worry: Not on file    Inability: Not on file  . Transportation needs:    Medical: Not on file    Non-medical: Not on file  Tobacco Use  . Smoking status: Former Smoker    Packs/day: 1.00    Years: 1.00    Pack years: 1.00    Types: Cigarettes    Last attempt to quit: 02/11/1982    Years since quitting: 35.3  . Smokeless tobacco: Never Used  Substance and Sexual Activity  . Alcohol use: No    Alcohol/week: 0.0 oz  . Drug use: No  . Sexual activity: Not on file  Lifestyle  . Physical activity:    Days per week: Not on file    Minutes per session: Not on file  . Stress: Not on file  Relationships  . Social connections:    Talks on phone: Not on file    Gets together: Not on file    Attends religious service: Not on file    Active member of club or organization: Not on file    Attends meetings of clubs or organizations: Not on file    Relationship status: Not on file  . Intimate partner violence:    Fear of current or ex partner: Not on file    Emotionally abused: Not on file    Physically abused: Not on file    Forced sexual activity: Not on file  Other Topics Concern  . Not on file  Social History Narrative  . Not on file    FAMILY HISTORY:     Family Status  Relation Name Status  . Mother  Alive       arthritis, htn, DM  . Father  Deceased at age 69       bladder cancer, stroke  . Sister  Alive       breast cancer  . Sister  Alive       htn  . Sister  Alive       htn  . Sister  Alive       htn  . [employer]  . Cousin  Alive  . Mat Uncle  Deceased at age infancy  . MGM  Deceased at age 71       pneumonia  . MGF  Deceased  . PGM  Deceased  .  PGF  Deceased  . [employer]  . [employer]  . [employer]  . Son  Deceased at age 95       died of brain anerysm, after a 20 ft fall  . Brother  Scientist, forensic  . Brother  (Not Specified)    ROS:  A complete 10 system review of systems was obtained and was unremarkable apart from what is mentioned above.  PHYSICAL EXAMINATION:    VITALS:   Vitals:   06/11/17 0934  BP: 122/68  Pulse: 68  SpO2: 98%  Weight: 187 lb (84.8 kg)  Height: 5' 1.5" (1.562 m)    GEN:  Normal appears female in no acute distress.  Appears stated age. HEENT:  Normocephalic, atraumatic. The mucous membranes are moist. The superficial temporal arteries are without ropiness or tenderness. Cardiovascular: Regular rate and rhythm. Lungs: Clear to auscultation bilaterally. Neck/Heme: There are no carotid bruits noted bilaterally.  NEUROLOGICAL: Orientation:  The patient is alert and oriented x 3.   Cranial nerves: There is good facial symmetry.  Speech is fluent and clear. Soft palate rises symmetrically and there is no tongue deviation. Hearing is intact to conversational tone. Tone: Tone is good throughout. Sensation: Sensation is intact to light touch throughout Coordination:  The patient has no difficulty with RAM's or FNF bilaterally. Motor: Strength is at least antigravity x4. Gait and Station: The patient is able to ambulate without difficulty.   Lab Results  Component Value Date   WBC 4.7 05/29/2017   HGB 13.1 05/29/2017   HCT 39.7 05/29/2017   MCV 91.3  05/29/2017   PLT 80 (L) 05/29/2017   Lab Results  Component Value Date   TSH 1.119 10/13/2014   Lab Results  Component Value Date   IRON 78 05/15/2017   TIBC 344 05/15/2017   FERRITIN 98 05/15/2017     Chemistry      Component Value Date/Time   NA 143 05/29/2017 0753   NA 142 02/06/2017 0742   K 4.0 05/29/2017 0753   K 3.9 02/06/2017 0742   CL 107 05/29/2017 0753   CO2 24 05/29/2017 0753   CO2 22 02/06/2017 0742   BUN 19 05/29/2017 0753   BUN 13.5 02/06/2017 0742   CREATININE 0.82 05/29/2017 0753   CREATININE 0.8 02/06/2017 0742      Component Value Date/Time   CALCIUM 9.5 05/29/2017 0753   CALCIUM 8.5 02/06/2017 0742   ALKPHOS 95 05/29/2017 0753   ALKPHOS 130 02/06/2017 0742   AST 41 (H) 05/29/2017 0753   AST 47 (H) 02/06/2017 0742   ALT 37 05/29/2017 0753   ALT 54 02/06/2017 0742   BILITOT 0.7 05/29/2017 0753   BILITOT 0.65 02/06/2017 0742        IMPRESSION/PLAN  1. RLS, secondary to iron def anemia  -Iron was good at last check.  -She will continue with the Neupro patch, 4 mg daily.  She was given 3 weeks of samples today.  Medication is expensive for her.  2.   Thrombocytopenia  -chronic due to liver cirrhosis from NASH  4.  F/u in 6-8 months.

## 2017-06-11 ENCOUNTER — Ambulatory Visit (INDEPENDENT_AMBULATORY_CARE_PROVIDER_SITE_OTHER): Payer: Medicare Other | Admitting: Neurology

## 2017-06-11 ENCOUNTER — Encounter: Payer: Self-pay | Admitting: Neurology

## 2017-06-11 VITALS — BP 122/68 | HR 68 | Ht 61.5 in | Wt 187.0 lb

## 2017-06-11 DIAGNOSIS — G2581 Restless legs syndrome: Secondary | ICD-10-CM

## 2017-06-11 DIAGNOSIS — E611 Iron deficiency: Secondary | ICD-10-CM | POA: Diagnosis not present

## 2017-06-11 MED ORDER — ROTIGOTINE 4 MG/24HR TD PT24: 1.0000 | MEDICATED_PATCH | Freq: Every day | TRANSDERMAL | 6 refills | Status: DC

## 2017-06-11 MED ORDER — ROTIGOTINE 4 MG/24HR TD PT24: 1.0000 | MEDICATED_PATCH | Freq: Every day | TRANSDERMAL | 0 refills | Status: DC

## 2017-06-12 ENCOUNTER — Inpatient Hospital Stay: Payer: Medicare Other

## 2017-06-12 ENCOUNTER — Encounter: Payer: Self-pay | Admitting: Hematology and Oncology

## 2017-06-12 ENCOUNTER — Inpatient Hospital Stay: Payer: Medicare Other | Attending: Hematology and Oncology

## 2017-06-12 ENCOUNTER — Inpatient Hospital Stay (HOSPITAL_BASED_OUTPATIENT_CLINIC_OR_DEPARTMENT_OTHER): Payer: Medicare Other | Admitting: Hematology and Oncology

## 2017-06-12 VITALS — BP 146/69

## 2017-06-12 DIAGNOSIS — M545 Low back pain: Secondary | ICD-10-CM | POA: Insufficient documentation

## 2017-06-12 DIAGNOSIS — D72819 Decreased white blood cell count, unspecified: Secondary | ICD-10-CM | POA: Diagnosis not present

## 2017-06-12 DIAGNOSIS — Z79899 Other long term (current) drug therapy: Secondary | ICD-10-CM | POA: Insufficient documentation

## 2017-06-12 DIAGNOSIS — Z9221 Personal history of antineoplastic chemotherapy: Secondary | ICD-10-CM

## 2017-06-12 DIAGNOSIS — I10 Essential (primary) hypertension: Secondary | ICD-10-CM | POA: Insufficient documentation

## 2017-06-12 DIAGNOSIS — C482 Malignant neoplasm of peritoneum, unspecified: Secondary | ICD-10-CM | POA: Diagnosis not present

## 2017-06-12 DIAGNOSIS — D509 Iron deficiency anemia, unspecified: Secondary | ICD-10-CM

## 2017-06-12 DIAGNOSIS — Z5112 Encounter for antineoplastic immunotherapy: Secondary | ICD-10-CM | POA: Diagnosis present

## 2017-06-12 DIAGNOSIS — C569 Malignant neoplasm of unspecified ovary: Secondary | ICD-10-CM

## 2017-06-12 DIAGNOSIS — Z79810 Long term (current) use of selective estrogen receptor modulators (SERMs): Secondary | ICD-10-CM

## 2017-06-12 DIAGNOSIS — D696 Thrombocytopenia, unspecified: Secondary | ICD-10-CM | POA: Diagnosis not present

## 2017-06-12 DIAGNOSIS — G8929 Other chronic pain: Secondary | ICD-10-CM

## 2017-06-12 LAB — CBC WITH DIFFERENTIAL/PLATELET
Basophils Absolute: 0 10*3/uL (ref 0.0–0.1)
Basophils Relative: 1 %
Eosinophils Absolute: 0.1 10*3/uL (ref 0.0–0.5)
Eosinophils Relative: 2 %
HCT: 39.4 % (ref 34.8–46.6)
Hemoglobin: 12.9 g/dL (ref 11.6–15.9)
Lymphocytes Relative: 29 %
Lymphs Abs: 1.2 10*3/uL (ref 0.9–3.3)
MCH: 30.2 pg (ref 25.1–34.0)
MCHC: 32.8 g/dL (ref 31.5–36.0)
MCV: 92.2 fL (ref 79.5–101.0)
Monocytes Absolute: 0.2 10*3/uL (ref 0.1–0.9)
Monocytes Relative: 5 %
Neutro Abs: 2.6 10*3/uL (ref 1.5–6.5)
Neutrophils Relative %: 63 %
Platelets: 73 10*3/uL — ABNORMAL LOW (ref 145–400)
RBC: 4.28 MIL/uL (ref 3.70–5.45)
RDW: 17.8 % — ABNORMAL HIGH (ref 11.2–14.5)
WBC: 4.1 10*3/uL (ref 3.9–10.3)

## 2017-06-12 LAB — COMPREHENSIVE METABOLIC PANEL
ALT: 44 U/L (ref 0–55)
AST: 54 U/L — ABNORMAL HIGH (ref 5–34)
Albumin: 4.1 g/dL (ref 3.5–5.0)
Alkaline Phosphatase: 93 U/L (ref 40–150)
Anion gap: 11 (ref 3–11)
BUN: 17 mg/dL (ref 7–26)
CO2: 23 mmol/L (ref 22–29)
Calcium: 9.4 mg/dL (ref 8.4–10.4)
Chloride: 107 mmol/L (ref 98–109)
Creatinine, Ser: 0.81 mg/dL (ref 0.60–1.10)
GFR calc Af Amer: >60 mL/min (ref >60)
GFR calc non Af Amer: >60 mL/min (ref >60)
Glucose, Bld: 122 mg/dL (ref 70–140)
Potassium: 4.1 mmol/L (ref 3.5–5.1)
Sodium: 141 mmol/L (ref 136–145)
Total Bilirubin: 0.7 mg/dL (ref 0.2–1.2)
Total Protein: 7.5 g/dL (ref 6.4–8.3)

## 2017-06-12 LAB — IRON AND TIBC
Iron: 64 ug/dL (ref 41–142)
Saturation Ratios: 17 % — ABNORMAL LOW (ref 21–57)
TIBC: 365 ug/dL (ref 236–444)
UIBC: 301 ug/dL

## 2017-06-12 LAB — TOTAL PROTEIN, URINE DIPSTICK: Protein, ur: NEGATIVE mg/dL

## 2017-06-12 LAB — FERRITIN: Ferritin: 65 ng/mL (ref 9–269)

## 2017-06-12 MED ORDER — SODIUM CHLORIDE 0.9% FLUSH
10.0000 mL | INTRAVENOUS | Status: DC | PRN
  Administered 2017-06-12: 10 mL
  Filled 2017-06-12: qty 10

## 2017-06-12 MED ORDER — SODIUM CHLORIDE 0.9 % IV SOLN
Freq: Once | INTRAVENOUS | Status: AC
  Administered 2017-06-12: 10:00:00 via INTRAVENOUS

## 2017-06-12 MED ORDER — HEPARIN SOD (PORK) LOCK FLUSH 100 UNIT/ML IV SOLN
500.0000 [IU] | Freq: Once | INTRAVENOUS | Status: AC | PRN
Start: 2017-06-12 — End: 2017-06-12
  Administered 2017-06-12: 500 [IU]
  Filled 2017-06-12: qty 5

## 2017-06-12 MED ORDER — HYDROCODONE-ACETAMINOPHEN 5-325 MG PO TABS: ORAL_TABLET | ORAL | 0 refills | Status: DC

## 2017-06-12 MED ORDER — SODIUM CHLORIDE 0.9 % IV SOLN
10.3000 mg/kg | Freq: Once | INTRAVENOUS | Status: AC
  Administered 2017-06-12: 900 mg via INTRAVENOUS
  Filled 2017-06-12: qty 32

## 2017-06-12 NOTE — Patient Instructions (Signed)
[facility] Discharge Instructions for Patients Receiving Chemotherapy  Today you received the following chemotherapy agents Avastin  To help prevent nausea and vomiting after your treatment, we encourage you to take your nausea medication as directed   If you develop nausea and vomiting that is not controlled by your nausea medication, call the clinic.   BELOW ARE SYMPTOMS THAT SHOULD BE REPORTED IMMEDIATELY:  *FEVER GREATER THAN 100.5 F  *CHILLS WITH OR WITHOUT FEVER  NAUSEA AND VOMITING THAT IS NOT CONTROLLED WITH YOUR NAUSEA MEDICATION  *UNUSUAL SHORTNESS OF BREATH  *UNUSUAL BRUISING OR BLEEDING  TENDERNESS IN MOUTH AND THROAT WITH OR WITHOUT PRESENCE OF ULCERS  *URINARY PROBLEMS  *BOWEL PROBLEMS  UNUSUAL RASH Items with * indicate a potential emergency and should be followed up as soon as possible.  Feel free to call the clinic should you have any questions or concerns. The clinic phone number is [phone].  Please show the CHEMO ALERT CARD at check-in to the Emergency Department and triage nurse.   

## 2017-06-12 NOTE — Patient Instructions (Signed)
Implanted Port Home Guide An implanted port is a type of central line that is placed under the skin. Central lines are used to provide IV access when treatment or nutrition needs to be given through a person's veins. Implanted ports are used for long-term IV access. An implanted port may be placed because:  You need IV medicine that would be irritating to the small veins in your hands or arms.  You need long-term IV medicines, such as antibiotics.  You need IV nutrition for a long period.  You need frequent blood draws for lab tests.  You need dialysis.  Implanted ports are usually placed in the chest area, but they can also be placed in the upper arm, the abdomen, or the leg. An implanted port has two main parts:  Reservoir. The reservoir is round and will appear as a small, raised area under your skin. The reservoir is the part where a needle is inserted to give medicines or draw blood.  Catheter. The catheter is a thin, flexible tube that extends from the reservoir. The catheter is placed into a large vein. Medicine that is inserted into the reservoir goes into the catheter and then into the vein.  How will I care for my incision site? Do not get the incision site wet. Bathe or shower as directed by your health care provider. How is my port accessed? Special steps must be taken to access the port:  Before the port is accessed, a numbing cream can be placed on the skin. This helps numb the skin over the port site.  Your health care provider uses a sterile technique to access the port. ? Your health care provider must put on a mask and sterile gloves. ? The skin over your port is cleaned carefully with an antiseptic and allowed to dry. ? The port is gently pinched between sterile gloves, and a needle is inserted into the port.  Only "non-coring" port needles should be used to access the port. Once the port is accessed, a blood return should be checked. This helps ensure that the port  is in the vein and is not clogged.  If your port needs to remain accessed for a constant infusion, a clear (transparent) bandage will be placed over the needle site. The bandage and needle will need to be changed every week, or as directed by your health care provider.  Keep the bandage covering the needle clean and dry. Do not get it wet. Follow your health care provider's instructions on how to take a shower or bath while the port is accessed.  If your port does not need to stay accessed, no bandage is needed over the port.  What is flushing? Flushing helps keep the port from getting clogged. Follow your health care provider's instructions on how and when to flush the port. Ports are usually flushed with saline solution or a medicine called heparin. The need for flushing will depend on how the port is used.  If the port is used for intermittent medicines or blood draws, the port will need to be flushed: ? After medicines have been given. ? After blood has been drawn. ? As part of routine maintenance.  If a constant infusion is running, the port may not need to be flushed.  How long will my port stay implanted? The port can stay in for as long as your health care provider thinks it is needed. When it is time for the port to come out, surgery will be   done to remove it. The procedure is similar to the one performed when the port was put in. When should I seek immediate medical care? When you have an implanted port, you should seek immediate medical care if:  You notice a bad smell coming from the incision site.  You have swelling, redness, or drainage at the incision site.  You have more swelling or pain at the port site or the surrounding area.  You have a fever that is not controlled with medicine.  This information is not intended to replace advice given to you by your health care provider. Make sure you discuss any questions you have with your health care provider. Document  Released: 01/28/2005 Document Revised: 07/06/2015 Document Reviewed: 10/05/2012 [employer] Interactive Patient Education  2017 [employer]  

## 2017-06-12 NOTE — Progress Notes (Signed)
[facility] OFFICE PROGRESS NOTE  Patient Care Team: Helane Rima, MD as PCP - General (Family Medicine)  ASSESSMENT & PLAN:  Primary peritoneal carcinomatosis [facility]) So far, she have no clinical signs of disease progression Her tumor marker is stable I plan to continue maintenance treatment with Avastin every 2 weeks I plan to repeat CT imaging in 6 months, due in October   Essential hypertension Blood pressure is mildly elevated today but could be due to whitecoat hypertension Urine tests show no proteinuria For now, I will continue treatment without dose adjustment and she will continue her antihypertensives as directed She has recent medication adjustment through her primary care doctor and I recommend she continues the same  Low back pain Has chronic back pain since diagnosis of cancer I refilled her prescription for pain medicine today We discussed narcotic refill policy.  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 She is not symptomatic.  Recommend close observation only There is no need for dosage adjustment of treatment   No orders of the defined types were placed in this encounter.   INTERVAL HISTORY: Please see below for problem oriented charting. She returns for further follow-up She is doing well She has recent blood pressure medication changes through her primary care doctor The patient denies any recent signs or symptoms of bleeding such as spontaneous epistaxis, hematuria or hematochezia. 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  No worrisome liver mass.  There are associated portal venous collaterals and splenomegaly consistent with portal venous hypertension. 2.  No other significant upper abdominal findings      11/08/2013 Imaging    US showed ascites      11/08/2013 Initial Diagnosis    Patient had ~ 2 months of some urinary incontinence, saw Dr Ronita Hipps in 10-2013, with pelvic mass on exam       11/12/2013 Imaging    There are findings of progressive hepatic cirrhosis with a large amount of ascites and mild splenomegaly. There are venous collaterals present. 2. There is abnormal thickening of the peritoneal surface along the left mid and lower abdominal wall. There is abnormal soft tissue density in the pelvis as well which could reflect the clinically suspected ovarian malignancy but the findings are nondiagnostic. 3. There is a new small left pleural effusion. 4. There is no acute bowel abnormality.      11/18/2013 Imaging    Successful ultrasound guided diagnostic and therapeutic paracentesis yielding 4.1 liters of ascites.       11/18/2013 Pathology Results    PERITONEAL/ASCITIC FLUID (SPECIMEN 1 OF 1 COLLECTED 11/18/13): SEROUS CARCINOMA, PLEASE SEE COMMENT.      12/01/2013 Tumor Marker    Patient's tumor was tested for the following markers: CA125 Results of the tumor marker test revealed 1938      12/03/2013 Imaging    Successful ultrasound-guided therapeutic paracentesis  yielding 3.6 liters of peritoneal fluid.      12/07/2013 - 05/30/2014 Chemotherapy    She received 3 cycles of carboplatin and Taxol, interrupted cycle 4 for surgery.  She subsequently completed 3 more cycles of chemotherapy after surgery      12/20/2013  Imaging    Successful ultrasound-guided diagnostic and therapeutic paracentesis yielding 2.2 liters of peritoneal fluid      12/20/2013 Pathology Results    PERITONEAL/ASCITIC FLUID(SPECIMEN 1 OF 1 COLLECTED 12/20/13): MALIGNANT CELLS CONSISTENT WITH SEROUS CARCINOMA      01/13/2014 Tumor Marker    Patient's tumor was tested for the following markers: CA125 Results of the tumor marker test revealed 227      02/07/2014 Tumor Marker    Patient's tumor was tested for the following markers: CA125 Results of the tumor marker test revealed 130      02/15/2014 Genetic Testing    Genetics testing from 02-2014 normal ([employer] panel)      02/23/2014 Imaging    Interval decrease in ascites. 2. Morphologic changes in the liver consistent with cirrhosis. 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 Portal vein remains patent at this time. 3. Persistent but decreased abnormal soft tissue attenuation tracking in the omental fat. This may be secondary to interval improvement in metastatic disease. 4. Peritoneal thickening seen in the left abdomen and pelvis on the previous study has decreased and nearly resolved in the interval. This also suggests interval improvement in metastatic disease.       03/07/2014 Procedure    Ultrasound and fluoroscopically guided right internal jugular single lumen power port catheter insertion. Tip in the SVC/RA junction. Catheter ready for use.      03/17/2014 Tumor Marker    Patient's tumor was tested for the following markers: CA125 Results of the tumor marker test revealed 45      03/29/2014 Pathology Results    1. Omentum, resection for tumor - HIGH GRADE SEROUS CARCINOMA, SEE COMMENT. 2. Ovary and fallopian tube, right - HIGH GRADE SEROUS CARCINOMA, SEE COMMENT. 3. Ovary and fallopian tube, left - HIGH GRADE SEROUS CARCINOMA, SEE COMMENT. Diagnosis Note 1. Nests and clusters of malignant cells are invading the omental tissue  (part #1) with associated fibrosis. The cells are pleomorphic with prominent nucleoli. There are scattered psammoma bodies. The ovaries are atrophic and exhibit multiple foci of surface based invasive carcinoma and associated fibrosis. The fallopian tubes have a few foci of carcinoma, also superficially located. There are no precursor lesions noted in the ovaries or fallopian tubes (the entire tubes and ovaries were submitted for evaluation). While there is some retraction artifact, there are several foci suspicious for lymphovascular invasion. 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 Given the fibrosis, there does appear to be a small amount of treatment effect, however, there is abundant residual tumor.       03/29/2014 Surgery    Procedure(s) Performed: 1. Exploratory laparotomy with bilateral salpingo-oophorectomy, omentectomy radical tumor debulking for ovarian cancer .  Surgeon: Thereasa Solo, MD.  Assistant Surgeon: Lahoma Crocker, M.D. Assistant: (an MD assistant was necessary for tissue manipulation, retraction and positioning due to the complexity of the case and hospital policies).  Operative Findings: 10cm omental cake from hepatic to splenic flexure, densely adherent to transverse colon. Milliary studding of tumor implants (<44m, too numerous in number to count) adherent to the  mesentery of the small bowel and small bowel wall. Small volume (100cc) ascites. Small ovaries bilaterally, densely adherent to the pelvic cul de sac. Left ovary and tube densely adherent to sigmoid colon. Cirrhotic liver with hepatomegaly and splenomegaly.     This represented an optimal cytoreduction (R1) with visible disease residual on the bowel wall and mesentery (millial, <96m implants).        04/18/2014 Tumor Marker    Patient's tumor was tested for the following  markers: CA125 Results of the tumor marker test revealed 122      05/16/2014 Tumor Marker    Patient's tumor was tested for the following markers: CA125 Results of the tumor marker test revealed 30      06/10/2014 Tumor Marker    Patient's tumor was tested for the following markers: CA125 Results of the tumor marker test revealed 19      07/04/2014 Imaging    Interval improvement in the appearance of peritoneal metastasis secondary to ovarian cancer. 2. 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       08/18/2014 Tumor Marker    Patient's tumor was tested for the following markers: CA125 Results of the tumor marker test revealed 16      10/13/2014 Tumor Marker    Patient's tumor was tested for the following markers: CA125 Results of the tumor marker test revealed 14      11/21/2014 Tumor Marker    Patient's tumor was tested for the following markers: CA125 Results of the tumor marker test revealed 16      12/28/2014 Tumor Marker    Patient's tumor was tested for the following markers: CA125 Results of the tumor marker test revealed 762      02/27/2015 Tumor Marker    Patient's tumor was tested for the following markers: CA125 Results of the tumor marker test revealed 20.2      03/22/2015 Imaging    Filler appearance to the prior exam, with very subtle fluid tracking along portions of the liver, and very minimal nodularity along the right paracolic gutter representing residua from the prior peritoneal cancer. The current abnormalities could simply be post therapy findings rather than necessarily representing residual malignancy. No new or enlarging lesions are identified. 2. Hepatic cirrhosis and splenomegaly. There is some gastric varices suggesting portal venous hypertension. 3. Left foraminal stenosis at L4-5 due to spurring. There is likely also some central narrowing of the thecal sac at this level. 4. Bibasilar scarring. 5. Chronic mild left mid kidney  scarring      03/27/2015 Tumor Marker    Patient's tumor was tested for the following markers: CA125 Results of the tumor marker test revealed 25.3      04/24/2015 Imaging    Disc bulge L2-3 with mild spinal stenosis and right foraminal encroachment. 2. Disc bulge L3-4 with mild spinal stenosis. 3. Multifactorial spinal, left lateral recess and foraminal stenosis L4-5. There is grade 1 anterolisthesis without evident dynamic instability.      05/29/2015 Tumor Marker    Patient's tumor was tested for the following markers: CA125 Results of the tumor marker test revealed 29.9      08/21/2015 Tumor Marker    Patient's tumor was tested for the following markers: CA125 Results of the tumor marker test revealed 45      09/18/2015 Tumor Marker    Patient's tumor was tested for the following markers: CA125 Results of the tumor marker test revealed 47      09/27/2015  Imaging    New small bowel mesenteric nodule and enlarged left external iliac lymph node, highly worrisome for metastatic disease. 2. Cirrhosis and splenomegaly      10/10/2015 Tumor Marker    Patient's tumor was tested for the following markers: CA125 Results of the tumor marker test revealed 53.4      10/10/2015 - 12/11/2015 Chemotherapy    She received 4 cycles of carboplatin only      11/20/2015 Tumor Marker    Patient's tumor was tested for the following markers: CA125 Results of the tumor marker test revealed 41.3      12/20/2015 Imaging    CT: Mild left external iliac lymphadenopathy is slightly decreased. Mildly enlarged lower mesenteric nodule is slightly decreased. 2. No new or progressive metastatic disease in the abdomen or pelvis. 3. Cirrhosis. Stable subcentimeter hypodense left liver lobe lesion. No new liver lesions. 4. Stable mild splenomegaly.  No ascites. 5. Mild sigmoid diverticulosis.      01/01/2016 -  Anti-estrogen oral therapy    She was placed on tamoxifen. Had letrozole initially but switched to  tamoxifen due to poor tolerance      01/15/2016 Tumor Marker    Patient's tumor was tested for the following markers: CA125 Results of the tumor marker test revealed 31.4      02/19/2016 Tumor Marker    Patient's tumor was tested for the following markers: CA125 Results of the tumor marker test revealed 36.5      05/13/2016 Imaging    No evidence of disease progression within the abdomen or pelvis. Previously noted small central mesenteric nodule and left external iliac node appear slightly smaller. 2. Stable changes of hepatic cirrhosis and portal hypertension with associated splenomegaly. No new or enlarging hepatic lesions are identified. 3. No acute findings.      05/13/2016 Tumor Marker    Patient's tumor was tested for the following markers: CA125 Results of the tumor marker test revealed 41.2      06/24/2016 Tumor Marker    Patient's tumor was tested for the following markers: CA125 Results of the tumor marker test revealed 47.3      08/20/2016 Imaging    1. The left external iliac lymph node is slightly increased in size compared to the prior exam, currently 1.1 cm and previously 0.9 cm in diameter. 2. Stable central mesenteric lymph node at 0.9 cm in diameter. 3. Stable trace free pelvic fluid and stable slight thickening along the right paracolic gutter, without well-defined peritoneal nodularity. 4. Other imaging findings of potential clinical significance: Subsegmental atelectasis or scarring in the lung bases. Hepatic cirrhosis. Left renal scarring. Mild splenomegaly. Sigmoid colon diverticulosis. Impingement at L4-5 due to spondylosis and degenerative disc disease broad Schmorl' s nodes at L3-L4. Pelvic floor laxity      08/23/2016 Tumor Marker    Patient's tumor was tested for the following markers: CA125 Results of the tumor marker test revealed 80.2      09/04/2016 Imaging    LV EF: 60% -   65%      09/12/2016 Tumor Marker    Patient's tumor was tested for the  following markers: CA125 Results of the tumor marker test revealed 98.5      09/12/2016 -  Chemotherapy    The patient received Doxil and Avastin.       09/26/2016 Tumor Marker    Patient's tumor was tested for the following markers: CA125 Results of the tumor marker test revealed 68.9  10/10/2016 Tumor Marker    Patient's tumor was tested for the following markers: CA125 Results of the tumor marker test revealed 65.6      11/07/2016 Tumor Marker    Patient's tumor was tested for the following markers: CA125 Results of the tumor marker test revealed 46.4      11/29/2016 Imaging    Stable mild peritoneal thickening, suspicious for peritoneal carcinomatosis. No new or progressive disease identified. No evidence of ascites.  No significant change in 10 mm left external iliac and 8 mm mesenteric lymph nodes.  Stable hepatic cirrhosis, and splenomegaly consistent with portal venous hypertension.  Colonic diverticulosis, without radiographic evidence of diverticulitis.      12/02/2016 Imaging    Normal LV size with EF 55%. Basal inferior and basal inferoseptal hypokinesis. Normal RV size and systolic function. No significant valvular abnormalities.      12/05/2016 Tumor Marker    Patient's tumor was tested for the following markers: CA125 Results of the tumor marker test revealed 49.1      01/09/2017 Tumor Marker    Patient's tumor was tested for the following markers: CA125 Results of the tumor marker test revealed 47.2      02/06/2017 Tumor Marker    Patient's tumor was tested for the following markers: CA125 Results of the tumor marker test revealed 44.1      02/18/2017 Imaging    Stable mild peritoneal thickening. No new or progressive disease identified within the abdomen or pelvis.  Stable tiny sub-cm left external iliac and mesenteric lymph nodes.  Stable hepatic cirrhosis, and splenomegaly consistent with portal venous hypertension. No evidence of hepatic  neoplasm.  Colonic diverticulosis, without radiographic evidence of diverticulitis.      03/06/2017 Tumor Marker    Patient's tumor was tested for the following markers: CA125 Results of the tumor marker test revealed 50.4      03/17/2017 Imaging    - Left ventricle: The cavity size was normal. Wall thickness was normal. Systolic function was normal. The estimated ejection fraction was in the range of 55% to 60%. Wall motion was normal; there were no regional wall motion abnormalities. Features are consistent with a pseudonormal left ventricular filling pattern, with concomitant abnormal relaxation and increased filling pressure (grade 2 diastolic dysfunction). - Mitral valve: There was mild regurgitation. - Left atrium: The atrium was mildly dilated      04/03/2017 Tumor Marker    Patient's tumor was tested for the following markers: CA125 Results of the tumor marker test revealed 47.2      05/14/2017 Imaging    CT scan of abdomen and pelvis Stable mild peritoneal thickening. No new or progressive disease within the abdomen or pelvis.  Stable hepatic cirrhosis. Stable splenomegaly, consistent with portal venous hypertension. No evidence of hepatic neoplasm.      05/15/2017 Tumor Marker    Patient's tumor was tested for the following markers: CA125 Results of the tumor marker test revealed 56.1       REVIEW OF SYSTEMS:   Constitutional: Denies fevers, chills or abnormal weight loss Eyes: Denies blurriness of vision Ears, nose, mouth, throat, and face: Denies mucositis or sore throat Respiratory: Denies cough, dyspnea or wheezes Cardiovascular: Denies palpitation, chest discomfort or lower extremity swelling Gastrointestinal:  Denies nausea, heartburn or change in bowel habits Skin: Denies abnormal skin rashes Lymphatics: Denies new lymphadenopathy or easy bruising Neurological:Denies numbness, tingling or new weaknesses Behavioral/Psych: Mood is stable, no new changes  All other  systems were reviewed with  the patient and are negative.  I have reviewed the past medical history, past surgical history, social history and family history with the patient and they are unchanged from previous note.  ALLERGIES:  is allergic to shellfish allergy; benadryl [diphenhydramine hcl]; penicillins; and tape.  MEDICATIONS:  Current Outpatient Medications  Medication Sig Dispense Refill  . bisoprolol (ZEBETA) 10 MG tablet Take 1 tablet (10 mg total) by mouth daily. 30 tablet 11  . cholecalciferol (VITAMIN D) 1000 units tablet Take 1,000 Units by mouth daily.    Marland Kitchen gabapentin (NEURONTIN) 300 MG capsule TAKE 2 CAPSULES BY MOUTH THREE TIMES DAILY (Patient taking differently: TAKE 2 CAPSULES twice daily) 120 capsule 0  . hydrochlorothiazide (MICROZIDE) 12.5 MG capsule Take 12.5 mg by mouth daily.  1  . HYDROcodone-acetaminophen (NORCO/VICODIN) 5-325 MG tablet take 1 tablet by mouth every 6 hours if needed for MODERATE PAIN 60 tablet 0  . lidocaine-prilocaine (EMLA) cream Apply to Porta-cath  1-2 hours prior to access as directed. 30 g 1  . metFORMIN (GLUCOPHAGE-XR) 500 MG 24 hr tablet Take 1,000 mg by mouth 2 (two) times daily.   0  . olmesartan (BENICAR) 20 MG tablet Take 20 mg by mouth daily.    . ondansetron (ZOFRAN) 8 MG tablet Take 1 tablet (8 mg total) by mouth every 8 (eight) hours as needed for nausea. 30 tablet 3  . pantoprazole (PROTONIX) 40 MG tablet Take 40 mg by mouth daily.   0  . promethazine (PHENERGAN) 25 MG tablet Take 1 tablet (25 mg total) by mouth every 6 (six) hours as needed for nausea. 30 tablet 3  . rotigotine (NEUPRO) 4 MG/24HR Place 1 patch onto the skin daily. 30 patch 6   No current facility-administered medications for this visit.    Facility-Administered Medications Ordered in Other Visits  Medication Dose Route Frequency Provider Last Rate Last Dose  . sodium chloride flush (NS) 0.9 % injection 10 mL  10 mL Intracatheter PRN Alvy Bimler, Samanthia Howland, MD   10 mL at  06/12/17 1102    PHYSICAL EXAMINATION: ECOG PERFORMANCE STATUS: 1 - Symptomatic but completely ambulatory  Vitals:   06/12/17 0921  BP: (!) 146/61  Pulse: 67  Resp: 18  Temp: 98 F (36.7 C)  SpO2: 100%   Filed Weights   06/12/17 0921  Weight: 187 lb 8 oz (85 kg)    GENERAL:alert, no distress and comfortable SKIN: skin color, texture, turgor are normal, no rashes or significant lesions EYES: normal, Conjunctiva are pink and non-injected, sclera clear OROPHARYNX:no exudate, no erythema and lips, buccal mucosa, and tongue normal  NECK: supple, thyroid normal size, non-tender, without nodularity LYMPH:  no palpable lymphadenopathy in the cervical, axillary or inguinal LUNGS: clear to auscultation and percussion with normal breathing effort HEART: regular rate & rhythm and no murmurs and no lower extremity edema ABDOMEN:abdomen soft, non-tender and normal bowel sounds Musculoskeletal:no cyanosis of digits and no clubbing  NEURO: alert & oriented x 3 with fluent speech, no focal motor/sensory deficits  LABORATORY DATA:  I have reviewed the data as listed    Component Value Date/Time   NA 141 06/12/2017 0902   NA 142 02/06/2017 0742   K 4.1 06/12/2017 0902   K 3.9 02/06/2017 0742   CL 107 06/12/2017 0902   CO2 23 06/12/2017 0902   CO2 22 02/06/2017 0742   GLUCOSE 122 06/12/2017 0902   GLUCOSE 156 (H) 02/06/2017 0742   BUN 17 06/12/2017 0902   BUN 13.5 02/06/2017 0742  CREATININE 0.81 06/12/2017 0902   CREATININE 0.8 02/06/2017 0742   CALCIUM 9.4 06/12/2017 0902   CALCIUM 8.5 02/06/2017 0742   PROT 7.5 06/12/2017 0902   PROT 6.9 02/06/2017 0742   ALBUMIN 4.1 06/12/2017 0902   ALBUMIN 3.7 02/06/2017 0742   AST 54 (H) 06/12/2017 0902   AST 47 (H) 02/06/2017 0742   ALT 44 06/12/2017 0902   ALT 54 02/06/2017 0742   ALKPHOS 93 06/12/2017 0902   ALKPHOS 130 02/06/2017 0742   BILITOT 0.7 06/12/2017 0902   BILITOT 0.65 02/06/2017 0742   GFRNONAA >60 06/12/2017 0902    GFRAA >60 06/12/2017 0902    No results found for: SPEP, UPEP  Lab Results  Component Value Date   WBC 4.1 06/12/2017   NEUTROABS 2.6 06/12/2017   HGB 12.9 06/12/2017   HCT 39.4 06/12/2017   MCV 92.2 06/12/2017   PLT 73 (L) 06/12/2017      Chemistry      Component Value Date/Time   NA 141 06/12/2017 0902   NA 142 02/06/2017 0742   K 4.1 06/12/2017 0902   K 3.9 02/06/2017 0742   CL 107 06/12/2017 0902   CO2 23 06/12/2017 0902   CO2 22 02/06/2017 0742   BUN 17 06/12/2017 0902   BUN 13.5 02/06/2017 0742   CREATININE 0.81 06/12/2017 0902   CREATININE 0.8 02/06/2017 0742      Component Value Date/Time   CALCIUM 9.4 06/12/2017 0902   CALCIUM 8.5 02/06/2017 0742   ALKPHOS 93 06/12/2017 0902   ALKPHOS 130 02/06/2017 0742   AST 54 (H) 06/12/2017 0902   AST 47 (H) 02/06/2017 0742   ALT 44 06/12/2017 0902   ALT 54 02/06/2017 0742   BILITOT 0.7 06/12/2017 0902   BILITOT 0.65 02/06/2017 0742       RADIOGRAPHIC STUDIES: I have personally reviewed the radiological images as listed and agreed with the findings in the report. Ct Abdomen Pelvis W Contrast  Result Date: 05/14/2017 CLINICAL DATA:  Followup ovarian carcinoma with peritoneal carcinomatosis. Thrombocytopenia. Restaging. EXAM: CT ABDOMEN AND PELVIS WITH CONTRAST TECHNIQUE: Multidetector CT imaging of the abdomen and pelvis was performed using the standard protocol following bolus administration of intravenous contrast. CONTRAST:  148m ISOVUE-300 IOPAMIDOL (ISOVUE-300) INJECTION 61% COMPARISON:  02/18/2017 FINDINGS: Lower Chest: No acute findings. Hepatobiliary: No hepatic masses identified. Hepatic cirrhosis again demonstrated. Gallbladder is unremarkable. Pancreas:  No mass or inflammatory changes. Spleen: Stable splenomegaly measuring 15 cm, consistent with portal venous hypertension. No splenic masses identified. Adrenals/Urinary Tract: No masses identified. Stable mild left renal scarring. No evidence of hydronephrosis.  Unremarkable unopacified urinary bladder. Stomach/Bowel: No evidence of obstruction, inflammatory process or abnormal fluid collections. Vascular/Lymphatic: No pathologically enlarged lymph nodes. Tiny sub-cm mesenteric and left iliac lymph nodes remain stable. No abdominal aortic aneurysm. Reproductive: Prior hysterectomy noted. Adnexal regions are unremarkable in appearance. Mild peritoneal thickening in the dependent pelvis and mesenteric soft tissue stranding appears stable. No evidence of ascites. Other:  None. Musculoskeletal:  No suspicious bone lesions identified. IMPRESSION: Stable mild peritoneal thickening. No new or progressive disease within the abdomen or pelvis. Stable hepatic cirrhosis. Stable splenomegaly, consistent with portal venous hypertension. No evidence of hepatic neoplasm. Electronically Signed   By: JEarle GellM.D.   On: 05/14/2017 14:43    All questions were answered. The patient knows to call the clinic with any problems, questions or concerns. No barriers to learning was detected.  I spent 15 minutes counseling the patient face to face. The  total time spent in the appointment was 20 minutes and more than 50% was on counseling and review of test results  [name], MD 06/12/2017 12:13 PM

## 2017-06-12 NOTE — Assessment & Plan Note (Signed)
So far, she have no clinical signs of disease progression Her tumor marker is stable I plan to continue maintenance treatment with Avastin every 2 weeks I plan to repeat CT imaging in 6 months, due in October

## 2017-06-12 NOTE — Assessment & Plan Note (Signed)
The patient has history of recurrent iron deficiency anemia Repeat iron studies today is satisfactory We discussed the risk and benefits of not pursuing EGD or colonoscopy to rule out GI bleed or malignancy For now, she is comfortable to be monitored with iron studies once a month

## 2017-06-12 NOTE — Assessment & Plan Note (Signed)
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 She is not symptomatic.  Recommend close observation only There is no need for dosage adjustment of treatment

## 2017-06-12 NOTE — Assessment & Plan Note (Addendum)
Blood pressure is mildly elevated today but could be due to whitecoat hypertension Urine tests show no proteinuria For now, I will continue treatment without dose adjustment and she will continue her antihypertensives as directed She has recent medication adjustment through her primary care doctor and I recommend she continues the same

## 2017-06-12 NOTE — Assessment & Plan Note (Signed)
Has chronic back pain since diagnosis of cancer I refilled her prescription for pain medicine today We discussed narcotic refill policy.

## 2017-06-13 ENCOUNTER — Telehealth: Payer: Self-pay

## 2017-06-13 LAB — CA 125: Cancer Antigen (CA) 125: 49.2 U/mL — ABNORMAL HIGH (ref 0.0–38.1)

## 2017-06-13 NOTE — Telephone Encounter (Signed)
Per Dr Alvy Bimler, tell pt iron studies and CA-125 are stable.  Notified pt via phone, she voiced understanding and was pleased with results. No other needs per pt at this time.

## 2017-06-26 ENCOUNTER — Inpatient Hospital Stay: Payer: Medicare Other

## 2017-06-26 VITALS — BP 134/80 | HR 60 | Temp 98.0°F | Resp 18

## 2017-06-26 DIAGNOSIS — C482 Malignant neoplasm of peritoneum, unspecified: Secondary | ICD-10-CM

## 2017-06-26 DIAGNOSIS — Z5112 Encounter for antineoplastic immunotherapy: Secondary | ICD-10-CM | POA: Diagnosis not present

## 2017-06-26 LAB — COMPREHENSIVE METABOLIC PANEL
ALT: 38 U/L (ref 0–55)
AST: 41 U/L — ABNORMAL HIGH (ref 5–34)
Albumin: 3.9 g/dL (ref 3.5–5.0)
Alkaline Phosphatase: 99 U/L (ref 40–150)
Anion gap: 12 — ABNORMAL HIGH (ref 3–11)
BUN: 15 mg/dL (ref 7–26)
CO2: 23 mmol/L (ref 22–29)
Calcium: 8.6 mg/dL (ref 8.4–10.4)
Chloride: 107 mmol/L (ref 98–109)
Creatinine, Ser: 0.81 mg/dL (ref 0.60–1.10)
GFR calc Af Amer: >60 mL/min (ref >60)
GFR calc non Af Amer: >60 mL/min (ref >60)
Glucose, Bld: 125 mg/dL (ref 70–140)
Potassium: 3.9 mmol/L (ref 3.5–5.1)
Sodium: 142 mmol/L (ref 136–145)
Total Bilirubin: 0.6 mg/dL (ref 0.2–1.2)
Total Protein: 7 g/dL (ref 6.4–8.3)

## 2017-06-26 LAB — CBC WITH DIFFERENTIAL/PLATELET
Basophils Absolute: 0 10*3/uL (ref 0.0–0.1)
Basophils Relative: 0 %
Eosinophils Absolute: 0.1 10*3/uL (ref 0.0–0.5)
Eosinophils Relative: 2 %
HCT: 33.3 % — ABNORMAL LOW (ref 34.8–46.6)
Hemoglobin: 11 g/dL — ABNORMAL LOW (ref 11.6–15.9)
Lymphocytes Relative: 30 %
Lymphs Abs: 1.2 10*3/uL (ref 0.9–3.3)
MCH: 30.4 pg (ref 25.1–34.0)
MCHC: 33 g/dL (ref 31.5–36.0)
MCV: 92.1 fL (ref 79.5–101.0)
Monocytes Absolute: 0.2 10*3/uL (ref 0.1–0.9)
Monocytes Relative: 5 %
Neutro Abs: 2.5 10*3/uL (ref 1.5–6.5)
Neutrophils Relative %: 63 %
Platelets: 84 10*3/uL — ABNORMAL LOW (ref 145–400)
RBC: 3.62 MIL/uL — ABNORMAL LOW (ref 3.70–5.45)
RDW: 16.8 % — ABNORMAL HIGH (ref 11.2–14.5)
WBC: 4 10*3/uL (ref 3.9–10.3)

## 2017-06-26 MED ORDER — SODIUM CHLORIDE 0.9 % IV SOLN
10.2000 mg/kg | Freq: Once | INTRAVENOUS | Status: AC
  Administered 2017-06-26: 900 mg via INTRAVENOUS
  Filled 2017-06-26: qty 32

## 2017-06-26 MED ORDER — SODIUM CHLORIDE 0.9 % IV SOLN
Freq: Once | INTRAVENOUS | Status: AC
  Administered 2017-06-26: 09:00:00 via INTRAVENOUS

## 2017-06-26 MED ORDER — SODIUM CHLORIDE 0.9% FLUSH
10.0000 mL | INTRAVENOUS | Status: DC | PRN
  Administered 2017-06-26: 10 mL
  Filled 2017-06-26: qty 10

## 2017-06-26 MED ORDER — HEPARIN SOD (PORK) LOCK FLUSH 100 UNIT/ML IV SOLN
500.0000 [IU] | Freq: Once | INTRAVENOUS | Status: AC | PRN
  Administered 2017-06-26: 500 [IU]
  Filled 2017-06-26: qty 5

## 2017-06-26 NOTE — Patient Instructions (Signed)
[facility] Discharge Instructions for Patients Receiving Chemotherapy  Today you received the following chemotherapy agents Avastin  To help prevent nausea and vomiting after your treatment, we encourage you to take your nausea medication as directed   If you develop nausea and vomiting that is not controlled by your nausea medication, call the clinic.   BELOW ARE SYMPTOMS THAT SHOULD BE REPORTED IMMEDIATELY:  *FEVER GREATER THAN 100.5 F  *CHILLS WITH OR WITHOUT FEVER  NAUSEA AND VOMITING THAT IS NOT CONTROLLED WITH YOUR NAUSEA MEDICATION  *UNUSUAL SHORTNESS OF BREATH  *UNUSUAL BRUISING OR BLEEDING  TENDERNESS IN MOUTH AND THROAT WITH OR WITHOUT PRESENCE OF ULCERS  *URINARY PROBLEMS  *BOWEL PROBLEMS  UNUSUAL RASH Items with * indicate a potential emergency and should be followed up as soon as possible.  Feel free to call the clinic should you have any questions or concerns. The clinic phone number is [phone].  Please show the CHEMO ALERT CARD at check-in to the Emergency Department and triage nurse.   

## 2017-06-28 ENCOUNTER — Other Ambulatory Visit: Payer: Self-pay | Admitting: Hematology and Oncology

## 2017-07-10 ENCOUNTER — Inpatient Hospital Stay: Payer: Medicare Other

## 2017-07-10 ENCOUNTER — Telehealth: Payer: Self-pay | Admitting: Hematology and Oncology

## 2017-07-10 ENCOUNTER — Encounter: Payer: Self-pay | Admitting: Hematology and Oncology

## 2017-07-10 ENCOUNTER — Other Ambulatory Visit: Payer: Self-pay | Admitting: Hematology and Oncology

## 2017-07-10 ENCOUNTER — Inpatient Hospital Stay (HOSPITAL_BASED_OUTPATIENT_CLINIC_OR_DEPARTMENT_OTHER): Payer: Medicare Other | Admitting: Hematology and Oncology

## 2017-07-10 VITALS — BP 144/60

## 2017-07-10 DIAGNOSIS — M545 Low back pain: Secondary | ICD-10-CM

## 2017-07-10 DIAGNOSIS — C482 Malignant neoplasm of peritoneum, unspecified: Secondary | ICD-10-CM

## 2017-07-10 DIAGNOSIS — D509 Iron deficiency anemia, unspecified: Secondary | ICD-10-CM

## 2017-07-10 DIAGNOSIS — D696 Thrombocytopenia, unspecified: Secondary | ICD-10-CM

## 2017-07-10 DIAGNOSIS — C569 Malignant neoplasm of unspecified ovary: Secondary | ICD-10-CM

## 2017-07-10 DIAGNOSIS — Z79899 Other long term (current) drug therapy: Secondary | ICD-10-CM

## 2017-07-10 DIAGNOSIS — Z79810 Long term (current) use of selective estrogen receptor modulators (SERMs): Secondary | ICD-10-CM | POA: Diagnosis not present

## 2017-07-10 DIAGNOSIS — I10 Essential (primary) hypertension: Secondary | ICD-10-CM

## 2017-07-10 DIAGNOSIS — D72819 Decreased white blood cell count, unspecified: Secondary | ICD-10-CM | POA: Diagnosis not present

## 2017-07-10 DIAGNOSIS — Z5112 Encounter for antineoplastic immunotherapy: Secondary | ICD-10-CM | POA: Diagnosis not present

## 2017-07-10 DIAGNOSIS — Z9221 Personal history of antineoplastic chemotherapy: Secondary | ICD-10-CM

## 2017-07-10 LAB — FERRITIN: Ferritin: 20 ng/mL (ref 9–269)

## 2017-07-10 LAB — IRON AND TIBC
Iron: 56 ug/dL (ref 41–142)
Saturation Ratios: 13 % — ABNORMAL LOW (ref 21–57)
TIBC: 435 ug/dL (ref 236–444)
UIBC: 379 ug/dL

## 2017-07-10 LAB — TOTAL PROTEIN, URINE DIPSTICK: Protein, ur: NEGATIVE mg/dL

## 2017-07-10 MED ORDER — SODIUM CHLORIDE 0.9 % IV SOLN
510.0000 mg | Freq: Once | INTRAVENOUS | Status: AC
  Administered 2017-07-10: 510 mg via INTRAVENOUS
  Filled 2017-07-10: qty 17

## 2017-07-10 MED ORDER — HEPARIN SOD (PORK) LOCK FLUSH 100 UNIT/ML IV SOLN
500.0000 [IU] | Freq: Once | INTRAVENOUS | Status: DC
  Filled 2017-07-10: qty 5

## 2017-07-10 MED ORDER — SODIUM CHLORIDE 0.9 % IV SOLN
Freq: Once | INTRAVENOUS | Status: AC
  Administered 2017-07-10: 10:00:00 via INTRAVENOUS

## 2017-07-10 MED ORDER — SODIUM CHLORIDE 0.9% FLUSH
10.0000 mL | Freq: Once | INTRAVENOUS | Status: AC
  Administered 2017-07-10: 10 mL
  Filled 2017-07-10: qty 10

## 2017-07-10 MED ORDER — HEPARIN SOD (PORK) LOCK FLUSH 100 UNIT/ML IV SOLN
500.0000 [IU] | Freq: Once | INTRAVENOUS | Status: AC | PRN
  Administered 2017-07-10: 500 [IU]
  Filled 2017-07-10: qty 5

## 2017-07-10 MED ORDER — SODIUM CHLORIDE 0.9% FLUSH
10.0000 mL | INTRAVENOUS | Status: DC | PRN
  Administered 2017-07-10: 10 mL
  Filled 2017-07-10: qty 10

## 2017-07-10 MED ORDER — SODIUM CHLORIDE 0.9 % IV SOLN
10.2000 mg/kg | Freq: Once | INTRAVENOUS | Status: AC
  Administered 2017-07-10: 900 mg via INTRAVENOUS
  Filled 2017-07-10: qty 32

## 2017-07-10 NOTE — Assessment & Plan Note (Signed)
Blood pressure is mildly elevated today but could be due to whitecoat hypertension Urine tests show no proteinuria For now, I will continue treatment without dose adjustment and she will continue her antihypertensives as directed She has recent medication adjustment through her primary care doctor and I recommend she continues the same

## 2017-07-10 NOTE — Telephone Encounter (Signed)
Gave patient AVs and calendar of upcoming June and July appointments.

## 2017-07-10 NOTE — Assessment & Plan Note (Signed)
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

## 2017-07-10 NOTE — Patient Instructions (Signed)
[facility] Discharge Instructions for Patients Receiving Chemotherapy  Today you received the following chemotherapy agents Avastin  To help prevent nausea and vomiting after your treatment, we encourage you to take your nausea medication as directed   If you develop nausea and vomiting that is not controlled by your nausea medication, call the clinic.   BELOW ARE SYMPTOMS THAT SHOULD BE REPORTED IMMEDIATELY:  *FEVER GREATER THAN 100.5 F  *CHILLS WITH OR WITHOUT FEVER  NAUSEA AND VOMITING THAT IS NOT CONTROLLED WITH YOUR NAUSEA MEDICATION  *UNUSUAL SHORTNESS OF BREATH  *UNUSUAL BRUISING OR BLEEDING  TENDERNESS IN MOUTH AND THROAT WITH OR WITHOUT PRESENCE OF ULCERS  *URINARY PROBLEMS  *BOWEL PROBLEMS  UNUSUAL RASH Items with * indicate a potential emergency and should be followed up as soon as possible.  Feel free to call the clinic should you have any questions or concerns. The clinic phone number is [phone].  Please show the CHEMO ALERT CARD at check-in to the Emergency Department and triage nurse.   

## 2017-07-10 NOTE — Assessment & Plan Note (Signed)
She has recurrent iron deficiency anemia of unknown etiology Iron studies are pending We will call the patient with test results and will give her IV iron as needed

## 2017-07-10 NOTE — Progress Notes (Signed)
Okay for treatment for Avastin with labs from 06/26/2017 per Dr. Alvy Bimler.

## 2017-07-10 NOTE — Progress Notes (Signed)
[place] OFFICE PROGRESS NOTE  Patient Care Team: Helane Rima, MD as PCP - General (Family Medicine)  ASSESSMENT & PLAN:  Primary peritoneal carcinomatosis [facility]) So far, she have no clinical signs of disease progression Her tumor marker is stable I plan to continue maintenance treatment with Avastin every 2 weeks I plan to repeat CT imaging in July  Iron deficiency anemia She has recurrent iron deficiency anemia of unknown etiology Iron studies are pending We will call the patient with test results and will give her IV iron as needed  Essential hypertension Blood pressure is mildly elevated today but could be due to whitecoat hypertension Urine tests show no proteinuria For now, I will continue treatment without dose adjustment and she will continue her antihypertensives as directed She has recent medication adjustment through her primary care doctor and I recommend she continues the same   No orders of the defined types were placed in this encounter.   INTERVAL HISTORY: Please see below for problem oriented charting. She returns with her husband for a Avastin and continue follow-up She has stopped Benicar recently Her blood pressure at home is satisfactory She denies recent bleeding Denies abdominal bloating, changes in bowel habits or nausea She complained of some leg cramps and persistent chronic joint pain.  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  No worrisome liver mass.  There are associated portal venous collaterals and splenomegaly consistent with portal venous hypertension. 2.  No other significant upper abdominal findings      11/08/2013 Imaging    US showed ascites      11/08/2013 Initial Diagnosis    Patient had ~ 2 months of some urinary incontinence,  saw Dr Ronita Hipps in 10-2013, with pelvic mass on exam       11/12/2013 Imaging    There are findings of progressive hepatic cirrhosis with a large amount of ascites and mild splenomegaly. There are venous collaterals present. 2. There is abnormal thickening of the peritoneal surface along the left mid and lower abdominal wall. There is abnormal soft tissue density in the pelvis as well which could reflect the clinically suspected ovarian malignancy but the findings are nondiagnostic. 3. There is a new small left pleural effusion. 4. There is no acute bowel abnormality.      11/18/2013 Imaging    Successful ultrasound guided diagnostic and therapeutic paracentesis yielding 4.1 liters of ascites.       11/18/2013 Pathology Results    PERITONEAL/ASCITIC FLUID (SPECIMEN 1 OF 1 COLLECTED 11/18/13): SEROUS CARCINOMA, PLEASE SEE COMMENT.      12/01/2013 Tumor Marker    Patient's tumor was tested for the following markers: CA125 Results of the tumor marker test revealed 1938      12/03/2013 Imaging    Successful ultrasound-guided therapeutic paracentesis yielding 3.6 liters of peritoneal fluid.      12/07/2013 - 05/30/2014 Chemotherapy    She received 3 cycles of carboplatin and Taxol, interrupted cycle 4 for surgery.  She subsequently completed 3 more cycles of chemotherapy after surgery      12/20/2013 Imaging    Successful ultrasound-guided diagnostic and therapeutic paracentesis yielding 2.2 liters of peritoneal fluid      12/20/2013 Pathology Results    PERITONEAL/ASCITIC FLUID(SPECIMEN 1 OF 1 COLLECTED 12/20/13): MALIGNANT CELLS CONSISTENT WITH SEROUS CARCINOMA  01/13/2014 Tumor Marker    Patient's tumor was tested for the following markers: CA125 Results of the tumor marker test revealed 227      02/07/2014 Tumor Marker    Patient's tumor was tested for the following markers: CA125 Results of the tumor marker test revealed 130      02/15/2014 Genetic Testing    Genetics testing  from 02-2014 normal ([employer] panel)      02/23/2014 Imaging    Interval decrease in ascites. 2. Morphologic changes in the liver consistent with cirrhosis. 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 Portal vein remains patent at this time. 3. Persistent but decreased abnormal soft tissue attenuation tracking in the omental fat. This may be secondary to interval improvement in metastatic disease. 4. Peritoneal thickening seen in the left abdomen and pelvis on the previous study has decreased and nearly resolved in the interval. This also suggests interval improvement in metastatic disease.       03/07/2014 Procedure    Ultrasound and fluoroscopically guided right internal jugular single lumen power port catheter insertion. Tip in the SVC/RA junction. Catheter ready for use.      03/17/2014 Tumor Marker    Patient's tumor was tested for the following markers: CA125 Results of the tumor marker test revealed 45      03/29/2014 Pathology Results    1. Omentum, resection for tumor - HIGH GRADE SEROUS CARCINOMA, SEE COMMENT. 2. Ovary and fallopian tube, right - HIGH GRADE SEROUS CARCINOMA, SEE COMMENT. 3. Ovary and fallopian tube, left - HIGH GRADE SEROUS CARCINOMA, SEE COMMENT. Diagnosis Note 1. Nests and clusters of malignant cells are invading the omental tissue (part #1) with associated fibrosis. The cells are pleomorphic with prominent nucleoli. There are scattered psammoma bodies. The ovaries are atrophic and exhibit multiple foci of surface based invasive carcinoma and associated fibrosis. The fallopian tubes have a few foci of carcinoma, also superficially located. There are no precursor lesions noted in the ovaries or fallopian tubes (the entire tubes and ovaries were submitted for evaluation). While there is some retraction artifact, there are several foci suspicious for lymphovascular invasion. 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 Given the fibrosis, there does appear to be a small amount of treatment effect, however, there is abundant residual tumor.       03/29/2014 Surgery    Procedure(s) Performed: 1. Exploratory laparotomy with bilateral salpingo-oophorectomy, omentectomy radical tumor debulking for ovarian cancer .  Surgeon: Thereasa Solo, MD.  Assistant Surgeon: Lahoma Crocker, M.D. Assistant: (an MD assistant was necessary for tissue manipulation, retraction and positioning due to the complexity of the case and hospital policies).  Operative Findings: 10cm omental cake from hepatic to splenic flexure, densely adherent to transverse colon. Milliary studding of tumor implants (<21m, too numerous in number to count) adherent to the mesentery of the small bowel and small bowel wall. Small volume (100cc) ascites. Small ovaries bilaterally, densely adherent to the pelvic cul de sac. Left ovary and tube densely adherent to sigmoid colon. Cirrhotic liver with hepatomegaly and splenomegaly.     This represented an optimal cytoreduction (R1) with visible disease residual on the bowel wall and mesentery (millial, <311mimplants).        04/18/2014 Tumor Marker    Patient's tumor was tested for the following markers: CA125 Results of the tumor marker test revealed 122  05/16/2014 Tumor Marker    Patient's tumor was tested for the following markers: CA125 Results of the tumor marker test revealed 30      06/10/2014 Tumor Marker    Patient's tumor was tested for the following markers: CA125 Results of the tumor marker test revealed 19      07/04/2014 Imaging    Interval improvement in the appearance of peritoneal metastasis secondary to ovarian cancer. 2. 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       08/18/2014 Tumor Marker    Patient's tumor was tested for the following  markers: CA125 Results of the tumor marker test revealed 16      10/13/2014 Tumor Marker    Patient's tumor was tested for the following markers: CA125 Results of the tumor marker test revealed 14      11/21/2014 Tumor Marker    Patient's tumor was tested for the following markers: CA125 Results of the tumor marker test revealed 16      12/28/2014 Tumor Marker    Patient's tumor was tested for the following markers: CA125 Results of the tumor marker test revealed 762      02/27/2015 Tumor Marker    Patient's tumor was tested for the following markers: CA125 Results of the tumor marker test revealed 20.2      03/22/2015 Imaging    Filler appearance to the prior exam, with very subtle fluid tracking along portions of the liver, and very minimal nodularity along the right paracolic gutter representing residua from the prior peritoneal cancer. The current abnormalities could simply be post therapy findings rather than necessarily representing residual malignancy. No new or enlarging lesions are identified. 2. Hepatic cirrhosis and splenomegaly. There is some gastric varices suggesting portal venous hypertension. 3. Left foraminal stenosis at L4-5 due to spurring. There is likely also some central narrowing of the thecal sac at this level. 4. Bibasilar scarring. 5. Chronic mild left mid kidney scarring      03/27/2015 Tumor Marker    Patient's tumor was tested for the following markers: CA125 Results of the tumor marker test revealed 25.3      04/24/2015 Imaging    Disc bulge L2-3 with mild spinal stenosis and right foraminal encroachment. 2. Disc bulge L3-4 with mild spinal stenosis. 3. Multifactorial spinal, left lateral recess and foraminal stenosis L4-5. There is grade 1 anterolisthesis without evident dynamic instability.      05/29/2015 Tumor Marker    Patient's tumor was tested for the following markers: CA125 Results of the tumor marker test revealed 29.9      08/21/2015 Tumor  Marker    Patient's tumor was tested for the following markers: CA125 Results of the tumor marker test revealed 45      09/18/2015 Tumor Marker    Patient's tumor was tested for the following markers: CA125 Results of the tumor marker test revealed 47      09/27/2015 Imaging    New small bowel mesenteric nodule and enlarged left external iliac lymph node, highly worrisome for metastatic disease. 2. Cirrhosis and splenomegaly      10/10/2015 Tumor Marker    Patient's tumor was tested for the following markers: CA125 Results of the tumor marker test revealed 53.4      10/10/2015 - 12/11/2015 Chemotherapy    She received 4 cycles of carboplatin only      11/20/2015 Tumor Marker    Patient's tumor was tested for the following markers: CA125 Results of the tumor marker  test revealed 41.3      12/20/2015 Imaging    CT: Mild left external iliac lymphadenopathy is slightly decreased. Mildly enlarged lower mesenteric nodule is slightly decreased. 2. No new or progressive metastatic disease in the abdomen or pelvis. 3. Cirrhosis. Stable subcentimeter hypodense left liver lobe lesion. No new liver lesions. 4. Stable mild splenomegaly.  No ascites. 5. Mild sigmoid diverticulosis.      01/01/2016 -  Anti-estrogen oral therapy    She was placed on tamoxifen. Had letrozole initially but switched to tamoxifen due to poor tolerance      01/15/2016 Tumor Marker    Patient's tumor was tested for the following markers: CA125 Results of the tumor marker test revealed 31.4      02/19/2016 Tumor Marker    Patient's tumor was tested for the following markers: CA125 Results of the tumor marker test revealed 36.5      05/13/2016 Imaging    No evidence of disease progression within the abdomen or pelvis. Previously noted small central mesenteric nodule and left external iliac node appear slightly smaller. 2. Stable changes of hepatic cirrhosis and portal hypertension with associated splenomegaly. No new or  enlarging hepatic lesions are identified. 3. No acute findings.      05/13/2016 Tumor Marker    Patient's tumor was tested for the following markers: CA125 Results of the tumor marker test revealed 41.2      06/24/2016 Tumor Marker    Patient's tumor was tested for the following markers: CA125 Results of the tumor marker test revealed 47.3      08/20/2016 Imaging    1. The left external iliac lymph node is slightly increased in size compared to the prior exam, currently 1.1 cm and previously 0.9 cm in diameter. 2. Stable central mesenteric lymph node at 0.9 cm in diameter. 3. Stable trace free pelvic fluid and stable slight thickening along the right paracolic gutter, without well-defined peritoneal nodularity. 4. Other imaging findings of potential clinical significance: Subsegmental atelectasis or scarring in the lung bases. Hepatic cirrhosis. Left renal scarring. Mild splenomegaly. Sigmoid colon diverticulosis. Impingement at L4-5 due to spondylosis and degenerative disc disease broad Schmorl' s nodes at L3-L4. Pelvic floor laxity      08/23/2016 Tumor Marker    Patient's tumor was tested for the following markers: CA125 Results of the tumor marker test revealed 80.2      09/04/2016 Imaging    LV EF: 60% -   65%      09/12/2016 Tumor Marker    Patient's tumor was tested for the following markers: CA125 Results of the tumor marker test revealed 98.5      09/12/2016 -  Chemotherapy    The patient received Doxil and Avastin.       09/26/2016 Tumor Marker    Patient's tumor was tested for the following markers: CA125 Results of the tumor marker test revealed 68.9      10/10/2016 Tumor Marker    Patient's tumor was tested for the following markers: CA125 Results of the tumor marker test revealed 65.6      11/07/2016 Tumor Marker    Patient's tumor was tested for the following markers: CA125 Results of the tumor marker test revealed 46.4      11/29/2016 Imaging    Stable mild  peritoneal thickening, suspicious for peritoneal carcinomatosis. No new or progressive disease identified. No evidence of ascites.  No significant change in 10 mm left external iliac and 8 mm mesenteric lymph nodes.  Stable hepatic cirrhosis, and splenomegaly consistent with portal venous hypertension.  Colonic diverticulosis, without radiographic evidence of diverticulitis.      12/02/2016 Imaging    Normal LV size with EF 55%. Basal inferior and basal inferoseptal hypokinesis. Normal RV size and systolic function. No significant valvular abnormalities.      12/05/2016 Tumor Marker    Patient's tumor was tested for the following markers: CA125 Results of the tumor marker test revealed 49.1      01/09/2017 Tumor Marker    Patient's tumor was tested for the following markers: CA125 Results of the tumor marker test revealed 47.2      02/06/2017 Tumor Marker    Patient's tumor was tested for the following markers: CA125 Results of the tumor marker test revealed 44.1      02/18/2017 Imaging    Stable mild peritoneal thickening. No new or progressive disease identified within the abdomen or pelvis.  Stable tiny sub-cm left external iliac and mesenteric lymph nodes.  Stable hepatic cirrhosis, and splenomegaly consistent with portal venous hypertension. No evidence of hepatic neoplasm.  Colonic diverticulosis, without radiographic evidence of diverticulitis.      03/06/2017 Tumor Marker    Patient's tumor was tested for the following markers: CA125 Results of the tumor marker test revealed 50.4      03/17/2017 Imaging    - Left ventricle: The cavity size was normal. Wall thickness was normal. Systolic function was normal. The estimated ejection fraction was in the range of 55% to 60%. Wall motion was normal; there were no regional wall motion abnormalities. Features are consistent with a pseudonormal left ventricular filling pattern, with concomitant abnormal relaxation and increased  filling pressure (grade 2 diastolic dysfunction). - Mitral valve: There was mild regurgitation. - Left atrium: The atrium was mildly dilated      04/03/2017 Tumor Marker    Patient's tumor was tested for the following markers: CA125 Results of the tumor marker test revealed 47.2      05/14/2017 Imaging    CT scan of abdomen and pelvis Stable mild peritoneal thickening. No new or progressive disease within the abdomen or pelvis.  Stable hepatic cirrhosis. Stable splenomegaly, consistent with portal venous hypertension. No evidence of hepatic neoplasm.      05/15/2017 Tumor Marker    Patient's tumor was tested for the following markers: CA125 Results of the tumor marker test revealed 56.1      06/12/2017 Tumor Marker    Patient's tumor was tested for the following markers: CA125 Results of the tumor marker test revealed 49.2       REVIEW OF SYSTEMS:   Constitutional: Denies fevers, chills or abnormal weight loss Eyes: Denies blurriness of vision Ears, nose, mouth, throat, and face: Denies mucositis or sore throat Respiratory: Denies cough, dyspnea or wheezes Cardiovascular: Denies palpitation, chest discomfort or lower extremity swelling Gastrointestinal:  Denies nausea, heartburn or change in bowel habits Skin: Denies abnormal skin rashes Lymphatics: Denies new lymphadenopathy or easy bruising Neurological:Denies numbness, tingling or new weaknesses Behavioral/Psych: Mood is stable, no new changes  All other systems were reviewed with the patient and are negative.  I have reviewed the past medical history, past surgical history, social history and family history with the patient and they are unchanged from previous note.  ALLERGIES:  is allergic to shellfish allergy; benadryl [diphenhydramine hcl]; penicillins; and tape.  MEDICATIONS:  Current Outpatient Medications  Medication Sig Dispense Refill  . bisoprolol (ZEBETA) 10 MG tablet Take 1 tablet (10 mg total)  by mouth daily.  30 tablet 11  . cholecalciferol (VITAMIN D) 1000 units tablet Take 1,000 Units by mouth daily.    Marland Kitchen gabapentin (NEURONTIN) 300 MG capsule Take 2 capsules (600 mg total) by mouth 2 (two) times daily. 120 capsule 11  . hydrochlorothiazide (MICROZIDE) 12.5 MG capsule Take 12.5 mg by mouth daily.  1  . HYDROcodone-acetaminophen (NORCO/VICODIN) 5-325 MG tablet take 1 tablet by mouth every 6 hours if needed for MODERATE PAIN 60 tablet 0  . lidocaine-prilocaine (EMLA) cream Apply to Porta-cath  1-2 hours prior to access as directed. 30 g 1  . metFORMIN (GLUCOPHAGE-XR) 500 MG 24 hr tablet Take 1,000 mg by mouth 2 (two) times daily.   0  . ondansetron (ZOFRAN) 8 MG tablet Take 1 tablet (8 mg total) by mouth every 8 (eight) hours as needed for nausea. 30 tablet 3  . pantoprazole (PROTONIX) 40 MG tablet Take 40 mg by mouth daily.   0  . promethazine (PHENERGAN) 25 MG tablet Take 1 tablet (25 mg total) by mouth every 6 (six) hours as needed for nausea. 30 tablet 3  . rotigotine (NEUPRO) 4 MG/24HR Place 1 patch onto the skin daily. 30 patch 6   No current facility-administered medications for this visit.     PHYSICAL EXAMINATION: ECOG PERFORMANCE STATUS: 1 - Symptomatic but completely ambulatory  Vitals:   07/10/17 0953  BP: (!) 155/74  Pulse: 70  Resp: 16  Temp: 97.7 F (36.5 C)  SpO2: 98%   Filed Weights   07/10/17 0953  Weight: 185 lb 11.2 oz (84.2 kg)    GENERAL:alert, no distress and comfortable SKIN: skin color, texture, turgor are normal, no rashes or significant lesions EYES: normal, Conjunctiva are pink and non-injected, sclera clear OROPHARYNX:no exudate, no erythema and lips, buccal mucosa, and tongue normal  NECK: supple, thyroid normal size, non-tender, without nodularity LYMPH:  no palpable lymphadenopathy in the cervical, axillary or inguinal LUNGS: clear to auscultation and percussion with normal breathing effort HEART: regular rate & rhythm and no murmurs and no lower  extremity edema ABDOMEN:abdomen soft, non-tender and normal bowel sounds Musculoskeletal:no cyanosis of digits and no clubbing  NEURO: alert & oriented x 3 with fluent speech, no focal motor/sensory deficits  LABORATORY DATA:  I have reviewed the data as listed    Component Value Date/Time   NA 142 06/26/2017 0808   NA 142 02/06/2017 0742   K 3.9 06/26/2017 0808   K 3.9 02/06/2017 0742   CL 107 06/26/2017 0808   CO2 23 06/26/2017 0808   CO2 22 02/06/2017 0742   GLUCOSE 125 06/26/2017 0808   GLUCOSE 156 (H) 02/06/2017 0742   BUN 15 06/26/2017 0808   BUN 13.5 02/06/2017 0742   CREATININE 0.81 06/26/2017 0808   CREATININE 0.8 02/06/2017 0742   CALCIUM 8.6 06/26/2017 0808   CALCIUM 8.5 02/06/2017 0742   PROT 7.0 06/26/2017 0808   PROT 6.9 02/06/2017 0742   ALBUMIN 3.9 06/26/2017 0808   ALBUMIN 3.7 02/06/2017 0742   AST 41 (H) 06/26/2017 0808   AST 47 (H) 02/06/2017 0742   ALT 38 06/26/2017 0808   ALT 54 02/06/2017 0742   ALKPHOS 99 06/26/2017 0808   ALKPHOS 130 02/06/2017 0742   BILITOT 0.6 06/26/2017 0808   BILITOT 0.65 02/06/2017 0742   GFRNONAA >60 06/26/2017 0808   GFRAA >60 06/26/2017 0808    No results found for: SPEP, UPEP  Lab Results  Component Value Date   WBC 4.0 06/26/2017  NEUTROABS 2.5 06/26/2017   HGB 11.0 (L) 06/26/2017   HCT 33.3 (L) 06/26/2017   MCV 92.1 06/26/2017   PLT 84 (L) 06/26/2017      Chemistry      Component Value Date/Time   NA 142 06/26/2017 0808   NA 142 02/06/2017 0742   K 3.9 06/26/2017 0808   K 3.9 02/06/2017 0742   CL 107 06/26/2017 0808   CO2 23 06/26/2017 0808   CO2 22 02/06/2017 0742   BUN 15 06/26/2017 0808   BUN 13.5 02/06/2017 0742   CREATININE 0.81 06/26/2017 0808   CREATININE 0.8 02/06/2017 0742      Component Value Date/Time   CALCIUM 8.6 06/26/2017 0808   CALCIUM 8.5 02/06/2017 0742   ALKPHOS 99 06/26/2017 0808   ALKPHOS 130 02/06/2017 0742   AST 41 (H) 06/26/2017 0808   AST 47 (H) 02/06/2017 0742    ALT 38 06/26/2017 0808   ALT 54 02/06/2017 0742   BILITOT 0.6 06/26/2017 0808   BILITOT 0.65 02/06/2017 0742       All questions were answered. The patient knows to call the clinic with any problems, questions or concerns. No barriers to learning was detected.  I spent 15 minutes counseling the patient face to face. The total time spent in the appointment was 20 minutes and more than 50% was on counseling and review of test results  [name], MD 07/10/2017 10:16 AM

## 2017-07-11 ENCOUNTER — Telehealth: Payer: Self-pay

## 2017-07-11 LAB — CA 125: Cancer Antigen (CA) 125: 46.3 U/mL — ABNORMAL HIGH (ref 0.0–38.1)

## 2017-07-11 NOTE — Telephone Encounter (Signed)
-----   Message from [name], MD sent at 07/11/2017  8:18 AM EDT ----- Regarding: CA-125 pls let her know lab is better ----- Message ----- From: Interface, Lab In [place] Sent: 07/10/2017   9:07 AM To: [name], MD

## 2017-07-11 NOTE — Telephone Encounter (Signed)
Called and given below message. Verbalized understanding.

## 2017-07-24 ENCOUNTER — Other Ambulatory Visit: Payer: Self-pay | Admitting: Hematology and Oncology

## 2017-07-24 ENCOUNTER — Inpatient Hospital Stay: Payer: Medicare Other | Attending: Hematology and Oncology

## 2017-07-24 ENCOUNTER — Inpatient Hospital Stay: Payer: Medicare Other

## 2017-07-24 VITALS — BP 140/60 | HR 57 | Temp 97.7°F | Resp 16 | Wt 186.5 lb

## 2017-07-24 DIAGNOSIS — C569 Malignant neoplasm of unspecified ovary: Secondary | ICD-10-CM

## 2017-07-24 DIAGNOSIS — C482 Malignant neoplasm of peritoneum, unspecified: Secondary | ICD-10-CM | POA: Diagnosis not present

## 2017-07-24 DIAGNOSIS — Z5112 Encounter for antineoplastic immunotherapy: Secondary | ICD-10-CM | POA: Diagnosis present

## 2017-07-24 LAB — COMPREHENSIVE METABOLIC PANEL
ALT: 38 U/L (ref 0–55)
AST: 44 U/L — ABNORMAL HIGH (ref 5–34)
Albumin: 4.3 g/dL (ref 3.5–5.0)
Alkaline Phosphatase: 95 U/L (ref 40–150)
Anion gap: 11 (ref 3–11)
BUN: 21 mg/dL (ref 7–26)
CO2: 25 mmol/L (ref 22–29)
Calcium: 8.9 mg/dL (ref 8.4–10.4)
Chloride: 105 mmol/L (ref 98–109)
Creatinine, Ser: 0.84 mg/dL (ref 0.60–1.10)
GFR calc Af Amer: >60 mL/min (ref >60)
GFR calc non Af Amer: >60 mL/min (ref >60)
Glucose, Bld: 107 mg/dL (ref 70–140)
Potassium: 4.2 mmol/L (ref 3.5–5.1)
Sodium: 141 mmol/L (ref 136–145)
Total Bilirubin: 1.1 mg/dL (ref 0.2–1.2)
Total Protein: 7.7 g/dL (ref 6.4–8.3)

## 2017-07-24 LAB — CBC WITH DIFFERENTIAL/PLATELET
Basophils Absolute: 0 10*3/uL (ref 0.0–0.1)
Basophils Relative: 0 %
Eosinophils Absolute: 0.1 10*3/uL (ref 0.0–0.5)
Eosinophils Relative: 2 %
HCT: 39.7 % (ref 34.8–46.6)
Hemoglobin: 12.7 g/dL (ref 11.6–15.9)
Lymphocytes Relative: 28 %
Lymphs Abs: 1.3 10*3/uL (ref 0.9–3.3)
MCH: 30.4 pg (ref 25.1–34.0)
MCHC: 32 g/dL (ref 31.5–36.0)
MCV: 95 fL (ref 79.5–101.0)
Monocytes Absolute: 0.2 10*3/uL (ref 0.1–0.9)
Monocytes Relative: 5 %
Neutro Abs: 2.8 10*3/uL (ref 1.5–6.5)
Neutrophils Relative %: 65 %
Platelets: 84 10*3/uL — ABNORMAL LOW (ref 145–400)
RBC: 4.18 MIL/uL (ref 3.70–5.45)
RDW: 17.5 % — ABNORMAL HIGH (ref 11.2–14.5)
WBC: 4.4 10*3/uL (ref 3.9–10.3)

## 2017-07-24 MED ORDER — SODIUM CHLORIDE 0.9% FLUSH
10.0000 mL | INTRAVENOUS | Status: DC | PRN
  Administered 2017-07-24: 10 mL
  Filled 2017-07-24: qty 10

## 2017-07-24 MED ORDER — HEPARIN SOD (PORK) LOCK FLUSH 100 UNIT/ML IV SOLN
500.0000 [IU] | Freq: Once | INTRAVENOUS | Status: AC | PRN
Start: 2017-07-24 — End: 2017-07-24
  Administered 2017-07-24: 500 [IU]
  Filled 2017-07-24: qty 5

## 2017-07-24 MED ORDER — SODIUM CHLORIDE 0.9 % IV SOLN
Freq: Once | INTRAVENOUS | Status: AC
  Administered 2017-07-24: 08:00:00 via INTRAVENOUS

## 2017-07-24 MED ORDER — BEVACIZUMAB CHEMO INJECTION 400 MG/16ML
10.2000 mg/kg | Freq: Once | INTRAVENOUS | Status: AC
  Administered 2017-07-24: 900 mg via INTRAVENOUS
  Filled 2017-07-24: qty 32

## 2017-07-24 MED ORDER — HYDROCODONE-ACETAMINOPHEN 5-325 MG PO TABS: ORAL_TABLET | ORAL | 0 refills | Status: DC

## 2017-07-24 MED ORDER — SODIUM CHLORIDE 0.9% FLUSH
10.0000 mL | Freq: Once | INTRAVENOUS | Status: AC
  Administered 2017-07-24: 10 mL
  Filled 2017-07-24: qty 10

## 2017-07-24 NOTE — Patient Instructions (Signed)
[facility] Discharge Instructions for Patients Receiving Chemotherapy  Today you received the following chemotherapy agents Avastin  To help prevent nausea and vomiting after your treatment, we encourage you to take your nausea medication as directed   If you develop nausea and vomiting that is not controlled by your nausea medication, call the clinic.   BELOW ARE SYMPTOMS THAT SHOULD BE REPORTED IMMEDIATELY:  *FEVER GREATER THAN 100.5 F  *CHILLS WITH OR WITHOUT FEVER  NAUSEA AND VOMITING THAT IS NOT CONTROLLED WITH YOUR NAUSEA MEDICATION  *UNUSUAL SHORTNESS OF BREATH  *UNUSUAL BRUISING OR BLEEDING  TENDERNESS IN MOUTH AND THROAT WITH OR WITHOUT PRESENCE OF ULCERS  *URINARY PROBLEMS  *BOWEL PROBLEMS  UNUSUAL RASH Items with * indicate a potential emergency and should be followed up as soon as possible.  Feel free to call the clinic should you have any questions or concerns. The clinic phone number is [phone].  Please show the CHEMO ALERT CARD at check-in to the Emergency Department and triage nurse.   

## 2017-08-07 ENCOUNTER — Inpatient Hospital Stay: Payer: Medicare Other

## 2017-08-07 VITALS — BP 141/69 | HR 61 | Temp 98.1°F | Resp 17

## 2017-08-07 DIAGNOSIS — C569 Malignant neoplasm of unspecified ovary: Secondary | ICD-10-CM

## 2017-08-07 DIAGNOSIS — C482 Malignant neoplasm of peritoneum, unspecified: Secondary | ICD-10-CM

## 2017-08-07 DIAGNOSIS — D509 Iron deficiency anemia, unspecified: Secondary | ICD-10-CM

## 2017-08-07 DIAGNOSIS — Z5112 Encounter for antineoplastic immunotherapy: Secondary | ICD-10-CM | POA: Diagnosis not present

## 2017-08-07 LAB — IRON AND TIBC
Iron: 90 ug/dL (ref 41–142)
Saturation Ratios: 25 % (ref 21–57)
TIBC: 353 ug/dL (ref 236–444)
UIBC: 263 ug/dL

## 2017-08-07 LAB — CBC WITH DIFFERENTIAL/PLATELET
Basophils Absolute: 0 10*3/uL (ref 0.0–0.1)
Basophils Relative: 0 %
Eosinophils Absolute: 0.1 10*3/uL (ref 0.0–0.5)
Eosinophils Relative: 3 %
HCT: 38.5 % (ref 34.8–46.6)
Hemoglobin: 12.3 g/dL (ref 11.6–15.9)
Lymphocytes Relative: 27 %
Lymphs Abs: 1 10*3/uL (ref 0.9–3.3)
MCH: 30.3 pg (ref 25.1–34.0)
MCHC: 31.9 g/dL (ref 31.5–36.0)
MCV: 94.8 fL (ref 79.5–101.0)
Monocytes Absolute: 0.2 10*3/uL (ref 0.1–0.9)
Monocytes Relative: 6 %
Neutro Abs: 2.4 10*3/uL (ref 1.5–6.5)
Neutrophils Relative %: 64 %
Platelets: 74 10*3/uL — ABNORMAL LOW (ref 145–400)
RBC: 4.06 MIL/uL (ref 3.70–5.45)
RDW: 17.3 % — ABNORMAL HIGH (ref 11.2–14.5)
WBC: 3.8 10*3/uL — ABNORMAL LOW (ref 3.9–10.3)

## 2017-08-07 LAB — COMPREHENSIVE METABOLIC PANEL
ALT: 39 U/L (ref 0–44)
AST: 39 U/L (ref 15–41)
Albumin: 4 g/dL (ref 3.5–5.0)
Alkaline Phosphatase: 95 U/L (ref 38–126)
Anion gap: 12 (ref 5–15)
BUN: 17 mg/dL (ref 8–23)
CO2: 25 mmol/L (ref 22–32)
Calcium: 8.5 mg/dL — ABNORMAL LOW (ref 8.9–10.3)
Chloride: 106 mmol/L (ref 98–111)
Creatinine, Ser: 0.8 mg/dL (ref 0.44–1.00)
GFR calc Af Amer: >60 mL/min (ref >60)
GFR calc non Af Amer: >60 mL/min (ref >60)
Glucose, Bld: 121 mg/dL — ABNORMAL HIGH (ref 70–99)
Potassium: 4 mmol/L (ref 3.5–5.1)
Sodium: 143 mmol/L (ref 135–145)
Total Bilirubin: 1.1 mg/dL (ref 0.3–1.2)
Total Protein: 7.4 g/dL (ref 6.5–8.1)

## 2017-08-07 LAB — FERRITIN: Ferritin: 93 ng/mL (ref 11–307)

## 2017-08-07 MED ORDER — SODIUM CHLORIDE 0.9 % IV SOLN
Freq: Once | INTRAVENOUS | Status: AC
  Administered 2017-08-07: 09:00:00 via INTRAVENOUS

## 2017-08-07 MED ORDER — SODIUM CHLORIDE 0.9% FLUSH
10.0000 mL | INTRAVENOUS | Status: DC | PRN
  Administered 2017-08-07: 10 mL
  Filled 2017-08-07: qty 10

## 2017-08-07 MED ORDER — SODIUM CHLORIDE 0.9 % IV SOLN
10.2000 mg/kg | Freq: Once | INTRAVENOUS | Status: AC
  Administered 2017-08-07: 900 mg via INTRAVENOUS
  Filled 2017-08-07: qty 4

## 2017-08-07 MED ORDER — SODIUM CHLORIDE 0.9% FLUSH
10.0000 mL | Freq: Once | INTRAVENOUS | Status: AC
  Administered 2017-08-07: 10 mL
  Filled 2017-08-07: qty 10

## 2017-08-07 MED ORDER — HEPARIN SOD (PORK) LOCK FLUSH 100 UNIT/ML IV SOLN
500.0000 [IU] | Freq: Once | INTRAVENOUS | Status: AC | PRN
  Administered 2017-08-07: 500 [IU]
  Filled 2017-08-07: qty 5

## 2017-08-08 LAB — CA 125: Cancer Antigen (CA) 125: 52.9 U/mL — ABNORMAL HIGH (ref 0.0–38.1)

## 2017-08-13 ENCOUNTER — Other Ambulatory Visit: Payer: Self-pay | Admitting: Neurology

## 2017-08-13 MED ORDER — ROTIGOTINE 4 MG/24HR TD PT24: 1.0000 | MEDICATED_PATCH | Freq: Every day | TRANSDERMAL | 6 refills | Status: DC

## 2017-08-20 ENCOUNTER — Inpatient Hospital Stay: Payer: Medicare Other

## 2017-08-20 ENCOUNTER — Encounter (HOSPITAL_COMMUNITY): Payer: Self-pay

## 2017-08-20 ENCOUNTER — Inpatient Hospital Stay: Payer: Medicare Other | Attending: Hematology and Oncology

## 2017-08-20 ENCOUNTER — Ambulatory Visit (HOSPITAL_COMMUNITY)
Admission: RE | Admit: 2017-08-20 | Discharge: 2017-08-20 | Disposition: A | Discharge status: Home / Self Care | Payer: Medicare Other | Source: Ambulatory Visit | Attending: Hematology and Oncology | Admitting: Hematology and Oncology

## 2017-08-20 DIAGNOSIS — D72819 Decreased white blood cell count, unspecified: Secondary | ICD-10-CM | POA: Diagnosis not present

## 2017-08-20 DIAGNOSIS — C569 Malignant neoplasm of unspecified ovary: Secondary | ICD-10-CM | POA: Diagnosis not present

## 2017-08-20 DIAGNOSIS — Z9221 Personal history of antineoplastic chemotherapy: Secondary | ICD-10-CM | POA: Insufficient documentation

## 2017-08-20 DIAGNOSIS — K769 Liver disease, unspecified: Secondary | ICD-10-CM | POA: Diagnosis not present

## 2017-08-20 DIAGNOSIS — Z5112 Encounter for antineoplastic immunotherapy: Secondary | ICD-10-CM | POA: Diagnosis not present

## 2017-08-20 DIAGNOSIS — Z79899 Other long term (current) drug therapy: Secondary | ICD-10-CM | POA: Diagnosis not present

## 2017-08-20 DIAGNOSIS — R161 Splenomegaly, not elsewhere classified: Secondary | ICD-10-CM | POA: Insufficient documentation

## 2017-08-20 DIAGNOSIS — C482 Malignant neoplasm of peritoneum, unspecified: Secondary | ICD-10-CM

## 2017-08-20 DIAGNOSIS — Z90722 Acquired absence of ovaries, bilateral: Secondary | ICD-10-CM | POA: Diagnosis not present

## 2017-08-20 DIAGNOSIS — D696 Thrombocytopenia, unspecified: Secondary | ICD-10-CM | POA: Insufficient documentation

## 2017-08-20 DIAGNOSIS — I10 Essential (primary) hypertension: Secondary | ICD-10-CM | POA: Diagnosis not present

## 2017-08-20 DIAGNOSIS — M545 Low back pain: Secondary | ICD-10-CM | POA: Diagnosis not present

## 2017-08-20 DIAGNOSIS — K7460 Unspecified cirrhosis of liver: Secondary | ICD-10-CM | POA: Diagnosis not present

## 2017-08-20 DIAGNOSIS — Z90710 Acquired absence of both cervix and uterus: Secondary | ICD-10-CM | POA: Insufficient documentation

## 2017-08-20 LAB — CBC WITH DIFFERENTIAL/PLATELET
Basophils Absolute: 0 10*3/uL (ref 0.0–0.1)
Basophils Relative: 0 %
Eosinophils Absolute: 0.1 10*3/uL (ref 0.0–0.5)
Eosinophils Relative: 2 %
HCT: 39.7 % (ref 34.8–46.6)
Hemoglobin: 13 g/dL (ref 11.6–15.9)
Lymphocytes Relative: 27 %
Lymphs Abs: 1.1 10*3/uL (ref 0.9–3.3)
MCH: 30.7 pg (ref 25.1–34.0)
MCHC: 32.7 g/dL (ref 31.5–36.0)
MCV: 93.6 fL (ref 79.5–101.0)
Monocytes Absolute: 0.2 10*3/uL (ref 0.1–0.9)
Monocytes Relative: 6 %
Neutro Abs: 2.7 10*3/uL (ref 1.5–6.5)
Neutrophils Relative %: 65 %
Platelets: 77 10*3/uL — ABNORMAL LOW (ref 145–400)
RBC: 4.24 MIL/uL (ref 3.70–5.45)
RDW: 16.8 % — ABNORMAL HIGH (ref 11.2–14.5)
WBC: 4.2 10*3/uL (ref 3.9–10.3)

## 2017-08-20 LAB — COMPREHENSIVE METABOLIC PANEL
ALT: 38 U/L (ref 0–44)
AST: 39 U/L (ref 15–41)
Albumin: 4 g/dL (ref 3.5–5.0)
Alkaline Phosphatase: 99 U/L (ref 38–126)
Anion gap: 11 (ref 5–15)
BUN: 15 mg/dL (ref 8–23)
CO2: 26 mmol/L (ref 22–32)
Calcium: 8.8 mg/dL — ABNORMAL LOW (ref 8.9–10.3)
Chloride: 105 mmol/L (ref 98–111)
Creatinine, Ser: 0.81 mg/dL (ref 0.44–1.00)
GFR calc Af Amer: >60 mL/min (ref >60)
GFR calc non Af Amer: >60 mL/min (ref >60)
Glucose, Bld: 115 mg/dL — ABNORMAL HIGH (ref 70–99)
Potassium: 4 mmol/L (ref 3.5–5.1)
Sodium: 142 mmol/L (ref 135–145)
Total Bilirubin: 0.9 mg/dL (ref 0.3–1.2)
Total Protein: 7.4 g/dL (ref 6.5–8.1)

## 2017-08-20 MED ORDER — IOPAMIDOL (ISOVUE-300) INJECTION 61%
100.0000 mL | Freq: Once | INTRAVENOUS | Status: AC | PRN
  Administered 2017-08-20: 100 mL via INTRAVENOUS

## 2017-08-20 MED ORDER — HEPARIN SOD (PORK) LOCK FLUSH 100 UNIT/ML IV SOLN
500.0000 [IU] | Freq: Once | INTRAVENOUS | Status: AC
  Administered 2017-08-20: 500 [IU] via INTRAVENOUS

## 2017-08-20 MED ORDER — IOPAMIDOL (ISOVUE-300) INJECTION 61%
30.0000 mL | Freq: Once | INTRAVENOUS | Status: AC | PRN
  Administered 2017-08-20: 30 mL via ORAL

## 2017-08-20 MED ORDER — HEPARIN SOD (PORK) LOCK FLUSH 100 UNIT/ML IV SOLN
INTRAVENOUS | Status: AC
  Filled 2017-08-20: qty 5

## 2017-08-20 MED ORDER — IOPAMIDOL (ISOVUE-300) INJECTION 61%
INTRAVENOUS | Status: AC
  Filled 2017-08-20: qty 100

## 2017-08-20 MED ORDER — IOPAMIDOL (ISOVUE-300) INJECTION 61%
INTRAVENOUS | Status: AC
  Filled 2017-08-20: qty 30

## 2017-08-20 MED ORDER — SODIUM CHLORIDE 0.9% FLUSH
10.0000 mL | Freq: Once | INTRAVENOUS | Status: AC
  Administered 2017-08-20: 10 mL
  Filled 2017-08-20: qty 10

## 2017-08-21 ENCOUNTER — Telehealth: Payer: Self-pay | Admitting: Hematology and Oncology

## 2017-08-21 ENCOUNTER — Inpatient Hospital Stay: Payer: Medicare Other

## 2017-08-21 ENCOUNTER — Encounter: Payer: Self-pay | Admitting: Hematology and Oncology

## 2017-08-21 ENCOUNTER — Other Ambulatory Visit: Payer: Self-pay | Admitting: Hematology and Oncology

## 2017-08-21 ENCOUNTER — Inpatient Hospital Stay (HOSPITAL_BASED_OUTPATIENT_CLINIC_OR_DEPARTMENT_OTHER): Payer: Medicare Other | Admitting: Hematology and Oncology

## 2017-08-21 DIAGNOSIS — D696 Thrombocytopenia, unspecified: Secondary | ICD-10-CM

## 2017-08-21 DIAGNOSIS — Z9221 Personal history of antineoplastic chemotherapy: Secondary | ICD-10-CM

## 2017-08-21 DIAGNOSIS — Z90710 Acquired absence of both cervix and uterus: Secondary | ICD-10-CM

## 2017-08-21 DIAGNOSIS — K769 Liver disease, unspecified: Secondary | ICD-10-CM

## 2017-08-21 DIAGNOSIS — D72819 Decreased white blood cell count, unspecified: Secondary | ICD-10-CM

## 2017-08-21 DIAGNOSIS — G8929 Other chronic pain: Secondary | ICD-10-CM

## 2017-08-21 DIAGNOSIS — Z90722 Acquired absence of ovaries, bilateral: Secondary | ICD-10-CM

## 2017-08-21 DIAGNOSIS — R161 Splenomegaly, not elsewhere classified: Secondary | ICD-10-CM

## 2017-08-21 DIAGNOSIS — Z5112 Encounter for antineoplastic immunotherapy: Secondary | ICD-10-CM | POA: Diagnosis not present

## 2017-08-21 DIAGNOSIS — C482 Malignant neoplasm of peritoneum, unspecified: Secondary | ICD-10-CM

## 2017-08-21 DIAGNOSIS — I10 Essential (primary) hypertension: Secondary | ICD-10-CM | POA: Diagnosis not present

## 2017-08-21 DIAGNOSIS — Z79899 Other long term (current) drug therapy: Secondary | ICD-10-CM

## 2017-08-21 DIAGNOSIS — M545 Low back pain: Secondary | ICD-10-CM

## 2017-08-21 DIAGNOSIS — C569 Malignant neoplasm of unspecified ovary: Secondary | ICD-10-CM

## 2017-08-21 MED ORDER — SODIUM CHLORIDE 0.9% FLUSH
10.0000 mL | INTRAVENOUS | Status: DC | PRN
  Administered 2017-08-21: 10 mL
  Filled 2017-08-21: qty 10

## 2017-08-21 MED ORDER — SODIUM CHLORIDE 0.9 % IV SOLN
Freq: Once | INTRAVENOUS | Status: AC
  Administered 2017-08-21: 10:00:00 via INTRAVENOUS

## 2017-08-21 MED ORDER — HEPARIN SOD (PORK) LOCK FLUSH 100 UNIT/ML IV SOLN
500.0000 [IU] | Freq: Once | INTRAVENOUS | Status: AC | PRN
  Administered 2017-08-21: 500 [IU]
  Filled 2017-08-21: qty 5

## 2017-08-21 MED ORDER — SODIUM CHLORIDE 0.9 % IV SOLN
10.2000 mg/kg | Freq: Once | INTRAVENOUS | Status: AC
  Administered 2017-08-21: 900 mg via INTRAVENOUS
  Filled 2017-08-21: qty 32

## 2017-08-21 NOTE — Assessment & Plan Note (Signed)
The patient have chronic joint pain and arthritis He is prescribed gabapentin along with hydrocodone as needed We will continue the same

## 2017-08-21 NOTE — Assessment & Plan Note (Signed)
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 She is not symptomatic.  Recommend close observation only There is no need for dosage adjustment of treatment

## 2017-08-21 NOTE — Patient Instructions (Signed)
[facility] Discharge Instructions for Patients Receiving Chemotherapy  Today you received the following chemotherapy agents Avastin  To help prevent nausea and vomiting after your treatment, we encourage you to take your nausea medication as directed   If you develop nausea and vomiting that is not controlled by your nausea medication, call the clinic.   BELOW ARE SYMPTOMS THAT SHOULD BE REPORTED IMMEDIATELY:  *FEVER GREATER THAN 100.5 F  *CHILLS WITH OR WITHOUT FEVER  NAUSEA AND VOMITING THAT IS NOT CONTROLLED WITH YOUR NAUSEA MEDICATION  *UNUSUAL SHORTNESS OF BREATH  *UNUSUAL BRUISING OR BLEEDING  TENDERNESS IN MOUTH AND THROAT WITH OR WITHOUT PRESENCE OF ULCERS  *URINARY PROBLEMS  *BOWEL PROBLEMS  UNUSUAL RASH Items with * indicate a potential emergency and should be followed up as soon as possible.  Feel free to call the clinic should you have any questions or concerns. The clinic phone number is [phone].  Please show the CHEMO ALERT CARD at check-in to the Emergency Department and triage nurse.   

## 2017-08-21 NOTE — Assessment & Plan Note (Signed)
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

## 2017-08-21 NOTE — Assessment & Plan Note (Signed)
Her blood pressure is well controlled We will continue close monitoring

## 2017-08-21 NOTE — Progress Notes (Signed)
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 She is not symptomatic.  Recommend close observation only There is no need for dosage adjustment of treatment  Low back pain The patient have chronic joint pain and arthritis He is prescribed gabapentin along with hydrocodone as needed We will continue the same   No orders of the defined types were placed in this encounter.   INTERVAL HISTORY: Please see below for problem oriented charting. She returns with her husband for further follow-up She has been doing well Denies hypertension at home No recent proteinuria Denies nausea, changes in bowel habits, abnormal vaginal bleeding or pain The patient denies any recent signs or symptoms of bleeding such as spontaneous epistaxis, hematuria or hematochezia.   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  No worrisome liver mass.  There are associated portal venous collaterals and splenomegaly consistent with portal venous hypertension. 2.  No other significant upper abdominal findings      11/08/2013 Imaging    US showed ascites      11/08/2013 Initial Diagnosis    Patient had ~ 2 months of some urinary incontinence, saw Dr Ronita Hipps in 10-2013, with pelvic  mass on exam       11/12/2013 Imaging    There are findings of progressive hepatic cirrhosis with a large amount of ascites and mild splenomegaly. There are venous collaterals present. 2. There is abnormal thickening of the peritoneal surface along the left mid and lower abdominal wall. There is abnormal soft tissue density in the pelvis as well which could reflect the clinically suspected ovarian malignancy but the findings are nondiagnostic. 3. There is a new small left pleural effusion. 4. There is no acute bowel abnormality.      11/18/2013 Imaging    Successful ultrasound guided diagnostic and therapeutic paracentesis yielding 4.1 liters of ascites.       11/18/2013 Pathology Results    PERITONEAL/ASCITIC FLUID (SPECIMEN 1 OF 1 COLLECTED 11/18/13): SEROUS CARCINOMA, PLEASE SEE COMMENT.      12/01/2013 Tumor Marker    Patient's tumor was tested for the following markers: CA125 Results of the tumor marker test revealed 1938      12/03/2013 Imaging    Successful ultrasound-guided therapeutic paracentesis yielding 3.6 liters of peritoneal fluid.      12/07/2013 - 05/30/2014 Chemotherapy    She received 3 cycles of carboplatin and Taxol, interrupted cycle 4 for surgery.  She subsequently completed 3 more cycles of chemotherapy after surgery      12/20/2013 Imaging    Successful ultrasound-guided diagnostic and therapeutic paracentesis yielding 2.2 liters of peritoneal fluid      12/20/2013 Pathology Results    PERITONEAL/ASCITIC FLUID(SPECIMEN 1 OF 1 COLLECTED 12/20/13): MALIGNANT CELLS CONSISTENT WITH SEROUS CARCINOMA      01/13/2014 Tumor Marker    Patient's tumor was tested  for the following markers: CA125 Results of the tumor marker test revealed 227      02/07/2014 Tumor Marker    Patient's tumor was tested for the following markers: CA125 Results of the tumor marker test revealed 130      02/15/2014 Genetic Testing    Genetics testing from 02-2014 normal ([employer] panel)       02/23/2014 Imaging    Interval decrease in ascites. 2. Morphologic changes in the liver consistent with cirrhosis. 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 Portal vein remains patent at this time. 3. Persistent but decreased abnormal soft tissue attenuation tracking in the omental fat. This may be secondary to interval improvement in metastatic disease. 4. Peritoneal thickening seen in the left abdomen and pelvis on the previous study has decreased and nearly resolved in the interval. This also suggests interval improvement in metastatic disease.       03/07/2014 Procedure    Ultrasound and fluoroscopically guided right internal jugular single lumen power port catheter insertion. Tip in the SVC/RA junction. Catheter ready for use.      03/17/2014 Tumor Marker    Patient's tumor was tested for the following markers: CA125 Results of the tumor marker test revealed 45      03/29/2014 Pathology Results    1. Omentum, resection for tumor - HIGH GRADE SEROUS CARCINOMA, SEE COMMENT. 2. Ovary and fallopian tube, right - HIGH GRADE SEROUS CARCINOMA, SEE COMMENT. 3. Ovary and fallopian tube, left - HIGH GRADE SEROUS CARCINOMA, SEE COMMENT. Diagnosis Note 1. Nests and clusters of malignant cells are invading the omental tissue (part #1) with associated fibrosis. The cells are pleomorphic with prominent nucleoli. There are scattered psammoma bodies. The ovaries are atrophic and exhibit multiple foci of surface based invasive carcinoma and associated fibrosis. The fallopian tubes have a few foci of carcinoma, also superficially located. There are no precursor lesions noted in the ovaries or fallopian tubes (the entire tubes and ovaries were submitted for evaluation). While there is some retraction artifact, there are several foci suspicious for lymphovascular invasion. 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 Given the fibrosis, there does appear to be a small amount of treatment effect, however, there is abundant residual tumor.       03/29/2014 Surgery    Procedure(s) Performed: 1. Exploratory laparotomy with bilateral salpingo-oophorectomy, omentectomy radical tumor debulking for ovarian cancer .  Surgeon: Thereasa Solo, MD.  Assistant Surgeon: Lahoma Crocker, M.D. Assistant: (an MD assistant was necessary for tissue manipulation, retraction and positioning due to the complexity of the case and hospital policies).  Operative Findings: 10cm omental cake from hepatic to splenic flexure, densely adherent to transverse colon. Milliary studding of tumor implants (<64m, too numerous in number to count) adherent to the mesentery of the small bowel and small bowel wall. Small volume (100cc) ascites. Small ovaries bilaterally, densely adherent to the pelvic cul de sac. Left ovary and tube densely adherent to sigmoid colon. Cirrhotic liver with hepatomegaly and splenomegaly.     This represented an optimal cytoreduction (R1) with visible disease residual on the bowel wall and mesentery (millial, <352mimplants).        04/18/2014 Tumor Marker    Patient's tumor was tested for the following markers: CA125 Results of the tumor marker test revealed 122      05/16/2014 Tumor Marker  Patient's tumor was tested for the following markers: CA125 Results of the tumor marker test revealed 30      06/10/2014 Tumor Marker    Patient's tumor was tested for the following markers: CA125 Results of the tumor marker test revealed 19      07/04/2014 Imaging    Interval improvement in the appearance of peritoneal metastasis secondary to ovarian cancer. 2. 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       08/18/2014 Tumor Marker    Patient's tumor was tested for the following markers: CA125 Results of the tumor  marker test revealed 16      10/13/2014 Tumor Marker    Patient's tumor was tested for the following markers: CA125 Results of the tumor marker test revealed 14      11/21/2014 Tumor Marker    Patient's tumor was tested for the following markers: CA125 Results of the tumor marker test revealed 16      12/28/2014 Tumor Marker    Patient's tumor was tested for the following markers: CA125 Results of the tumor marker test revealed 762      02/27/2015 Tumor Marker    Patient's tumor was tested for the following markers: CA125 Results of the tumor marker test revealed 20.2      03/22/2015 Imaging    Filler appearance to the prior exam, with very subtle fluid tracking along portions of the liver, and very minimal nodularity along the right paracolic gutter representing residua from the prior peritoneal cancer. The current abnormalities could simply be post therapy findings rather than necessarily representing residual malignancy. No new or enlarging lesions are identified. 2. Hepatic cirrhosis and splenomegaly. There is some gastric varices suggesting portal venous hypertension. 3. Left foraminal stenosis at L4-5 due to spurring. There is likely also some central narrowing of the thecal sac at this level. 4. Bibasilar scarring. 5. Chronic mild left mid kidney scarring      03/27/2015 Tumor Marker    Patient's tumor was tested for the following markers: CA125 Results of the tumor marker test revealed 25.3      04/24/2015 Imaging    Disc bulge L2-3 with mild spinal stenosis and right foraminal encroachment. 2. Disc bulge L3-4 with mild spinal stenosis. 3. Multifactorial spinal, left lateral recess and foraminal stenosis L4-5. There is grade 1 anterolisthesis without evident dynamic instability.      05/29/2015 Tumor Marker    Patient's tumor was tested for the following markers: CA125 Results of the tumor marker test revealed 29.9      08/21/2015 Tumor Marker    Patient's tumor was tested  for the following markers: CA125 Results of the tumor marker test revealed 45      09/18/2015 Tumor Marker    Patient's tumor was tested for the following markers: CA125 Results of the tumor marker test revealed 47      09/27/2015 Imaging    New small bowel mesenteric nodule and enlarged left external iliac lymph node, highly worrisome for metastatic disease. 2. Cirrhosis and splenomegaly      10/10/2015 Tumor Marker    Patient's tumor was tested for the following markers: CA125 Results of the tumor marker test revealed 53.4      10/10/2015 - 12/11/2015 Chemotherapy    She received 4 cycles of carboplatin only      11/20/2015 Tumor Marker    Patient's tumor was tested for the following markers: CA125 Results of the tumor marker test revealed 41.3  12/20/2015 Imaging    CT: Mild left external iliac lymphadenopathy is slightly decreased. Mildly enlarged lower mesenteric nodule is slightly decreased. 2. No new or progressive metastatic disease in the abdomen or pelvis. 3. Cirrhosis. Stable subcentimeter hypodense left liver lobe lesion. No new liver lesions. 4. Stable mild splenomegaly.  No ascites. 5. Mild sigmoid diverticulosis.      01/01/2016 -  Anti-estrogen oral therapy    She was placed on tamoxifen. Had letrozole initially but switched to tamoxifen due to poor tolerance      01/15/2016 Tumor Marker    Patient's tumor was tested for the following markers: CA125 Results of the tumor marker test revealed 31.4      02/19/2016 Tumor Marker    Patient's tumor was tested for the following markers: CA125 Results of the tumor marker test revealed 36.5      05/13/2016 Imaging    No evidence of disease progression within the abdomen or pelvis. Previously noted small central mesenteric nodule and left external iliac node appear slightly smaller. 2. Stable changes of hepatic cirrhosis and portal hypertension with associated splenomegaly. No new or enlarging hepatic lesions are  identified. 3. No acute findings.      05/13/2016 Tumor Marker    Patient's tumor was tested for the following markers: CA125 Results of the tumor marker test revealed 41.2      06/24/2016 Tumor Marker    Patient's tumor was tested for the following markers: CA125 Results of the tumor marker test revealed 47.3      08/20/2016 Imaging    1. The left external iliac lymph node is slightly increased in size compared to the prior exam, currently 1.1 cm and previously 0.9 cm in diameter. 2. Stable central mesenteric lymph node at 0.9 cm in diameter. 3. Stable trace free pelvic fluid and stable slight thickening along the right paracolic gutter, without well-defined peritoneal nodularity. 4. Other imaging findings of potential clinical significance: Subsegmental atelectasis or scarring in the lung bases. Hepatic cirrhosis. Left renal scarring. Mild splenomegaly. Sigmoid colon diverticulosis. Impingement at L4-5 due to spondylosis and degenerative disc disease broad Schmorl' s nodes at L3-L4. Pelvic floor laxity      08/23/2016 Tumor Marker    Patient's tumor was tested for the following markers: CA125 Results of the tumor marker test revealed 80.2      09/04/2016 Imaging    LV EF: 60% -   65%      09/12/2016 Tumor Marker    Patient's tumor was tested for the following markers: CA125 Results of the tumor marker test revealed 98.5      09/12/2016 -  Chemotherapy    The patient received Doxil and Avastin.       09/26/2016 Tumor Marker    Patient's tumor was tested for the following markers: CA125 Results of the tumor marker test revealed 68.9      10/10/2016 Tumor Marker    Patient's tumor was tested for the following markers: CA125 Results of the tumor marker test revealed 65.6      11/07/2016 Tumor Marker    Patient's tumor was tested for the following markers: CA125 Results of the tumor marker test revealed 46.4      11/29/2016 Imaging    Stable mild peritoneal thickening,  suspicious for peritoneal carcinomatosis. No new or progressive disease identified. No evidence of ascites.  No significant change in 10 mm left external iliac and 8 mm mesenteric lymph nodes.  Stable hepatic cirrhosis, and splenomegaly consistent with portal  venous hypertension.  Colonic diverticulosis, without radiographic evidence of diverticulitis.      12/02/2016 Imaging    Normal LV size with EF 55%. Basal inferior and basal inferoseptal hypokinesis. Normal RV size and systolic function. No significant valvular abnormalities.      12/05/2016 Tumor Marker    Patient's tumor was tested for the following markers: CA125 Results of the tumor marker test revealed 49.1      01/09/2017 Tumor Marker    Patient's tumor was tested for the following markers: CA125 Results of the tumor marker test revealed 47.2      02/06/2017 Tumor Marker    Patient's tumor was tested for the following markers: CA125 Results of the tumor marker test revealed 44.1      02/18/2017 Imaging    Stable mild peritoneal thickening. No new or progressive disease identified within the abdomen or pelvis.  Stable tiny sub-cm left external iliac and mesenteric lymph nodes.  Stable hepatic cirrhosis, and splenomegaly consistent with portal venous hypertension. No evidence of hepatic neoplasm.  Colonic diverticulosis, without radiographic evidence of diverticulitis.      03/06/2017 Tumor Marker    Patient's tumor was tested for the following markers: CA125 Results of the tumor marker test revealed 50.4      03/17/2017 Imaging    - Left ventricle: The cavity size was normal. Wall thickness was normal. Systolic function was normal. The estimated ejection fraction was in the range of 55% to 60%. Wall motion was normal; there were no regional wall motion abnormalities. Features are consistent with a pseudonormal left ventricular filling pattern, with concomitant abnormal relaxation and increased filling pressure (grade  2 diastolic dysfunction). - Mitral valve: There was mild regurgitation. - Left atrium: The atrium was mildly dilated      04/03/2017 Tumor Marker    Patient's tumor was tested for the following markers: CA125 Results of the tumor marker test revealed 47.2      05/14/2017 Imaging    CT scan of abdomen and pelvis Stable mild peritoneal thickening. No new or progressive disease within the abdomen or pelvis.  Stable hepatic cirrhosis. Stable splenomegaly, consistent with portal venous hypertension. No evidence of hepatic neoplasm.      05/15/2017 Tumor Marker    Patient's tumor was tested for the following markers: CA125 Results of the tumor marker test revealed 56.1      06/12/2017 Tumor Marker    Patient's tumor was tested for the following markers: CA125 Results of the tumor marker test revealed 49.2      07/10/2017 Tumor Marker    Patient's tumor was tested for the following markers: CA125 Results of the tumor marker test revealed 46.3      08/07/2017 Tumor Marker    Patient's tumor was tested for the following markers: CA125 Results of the tumor marker test revealed 52.9      08/20/2017 Imaging    1. Mild omental/peritoneal haziness, unchanged. No evidence of new metastatic disease. 2. Cirrhosis with splenomegaly.       REVIEW OF SYSTEMS:   Constitutional: Denies fevers, chills or abnormal weight loss Eyes: Denies blurriness of vision Ears, nose, mouth, throat, and face: Denies mucositis or sore throat Respiratory: Denies cough, dyspnea or wheezes Cardiovascular: Denies palpitation, chest discomfort or lower extremity swelling Gastrointestinal:  Denies nausea, heartburn or change in bowel habits Skin: Denies abnormal skin rashes Lymphatics: Denies new lymphadenopathy or easy bruising Neurological:Denies numbness, tingling or new weaknesses Behavioral/Psych: Mood is stable, no new changes  All other  systems were reviewed with the patient and are negative.  I have  reviewed the past medical history, past surgical history, social history and family history with the patient and they are unchanged from previous note.  ALLERGIES:  is allergic to shellfish allergy; benadryl [diphenhydramine hcl]; penicillins; and tape.  MEDICATIONS:  Current Outpatient Medications  Medication Sig Dispense Refill  . bisoprolol (ZEBETA) 10 MG tablet Take 1 tablet (10 mg total) by mouth daily. 30 tablet 11  . cholecalciferol (VITAMIN D) 1000 units tablet Take 1,000 Units by mouth daily.    Marland Kitchen gabapentin (NEURONTIN) 300 MG capsule Take 2 capsules (600 mg total) by mouth 2 (two) times daily. 120 capsule 11  . hydrochlorothiazide (MICROZIDE) 12.5 MG capsule Take 12.5 mg by mouth daily.  1  . HYDROcodone-acetaminophen (NORCO/VICODIN) 5-325 MG tablet take 1 tablet by mouth every 6 hours if needed for MODERATE PAIN 60 tablet 0  . lidocaine-prilocaine (EMLA) cream Apply to Porta-cath  1-2 hours prior to access as directed. 30 g 1  . metFORMIN (GLUCOPHAGE-XR) 500 MG 24 hr tablet Take 1,000 mg by mouth 2 (two) times daily.   0  . ondansetron (ZOFRAN) 8 MG tablet Take 1 tablet (8 mg total) by mouth every 8 (eight) hours as needed for nausea. 30 tablet 3  . pantoprazole (PROTONIX) 40 MG tablet Take 40 mg by mouth daily.   0  . promethazine (PHENERGAN) 25 MG tablet Take 1 tablet (25 mg total) by mouth every 6 (six) hours as needed for nausea. 30 tablet 3  . rotigotine (NEUPRO) 4 MG/24HR Place 1 patch onto the skin daily. 30 patch 6   No current facility-administered medications for this visit.     PHYSICAL EXAMINATION: ECOG PERFORMANCE STATUS: 0 - Asymptomatic  Vitals:   08/21/17 0806  BP: (!) 146/69  Pulse: 61  Resp: 18  Temp: 97.8 F (36.6 C)  SpO2: 99%   Filed Weights   08/21/17 0806  Weight: 187 lb 12.8 oz (85.2 kg)    GENERAL:alert, no distress and comfortable SKIN: skin color, texture, turgor are normal, no rashes or significant lesions EYES: normal, Conjunctiva are  pink and non-injected, sclera clear OROPHARYNX:no exudate, no erythema and lips, buccal mucosa, and tongue normal  NECK: supple, thyroid normal size, non-tender, without nodularity LYMPH:  no palpable lymphadenopathy in the cervical, axillary or inguinal LUNGS: clear to auscultation and percussion with normal breathing effort HEART: regular rate & rhythm and no murmurs and no lower extremity edema ABDOMEN:abdomen soft, non-tender and normal bowel sounds Musculoskeletal:no cyanosis of digits and no clubbing  NEURO: alert & oriented x 3 with fluent speech, no focal motor/sensory deficits  LABORATORY DATA:  I have reviewed the data as listed    Component Value Date/Time   NA 142 08/20/2017 0737   NA 142 02/06/2017 0742   K 4.0 08/20/2017 0737   K 3.9 02/06/2017 0742   CL 105 08/20/2017 0737   CO2 26 08/20/2017 0737   CO2 22 02/06/2017 0742   GLUCOSE 115 (H) 08/20/2017 0737   GLUCOSE 156 (H) 02/06/2017 0742   BUN 15 08/20/2017 0737   BUN 13.5 02/06/2017 0742   CREATININE 0.81 08/20/2017 0737   CREATININE 0.8 02/06/2017 0742   CALCIUM 8.8 (L) 08/20/2017 0737   CALCIUM 8.5 02/06/2017 0742   PROT 7.4 08/20/2017 0737   PROT 6.9 02/06/2017 0742   ALBUMIN 4.0 08/20/2017 0737   ALBUMIN 3.7 02/06/2017 0742   AST 39 08/20/2017 0737   AST 47 (H) 02/06/2017  0742   ALT 38 08/20/2017 0737   ALT 54 02/06/2017 0742   ALKPHOS 99 08/20/2017 0737   ALKPHOS 130 02/06/2017 0742   BILITOT 0.9 08/20/2017 0737   BILITOT 0.65 02/06/2017 0742   GFRNONAA >60 08/20/2017 0737   GFRAA >60 08/20/2017 0737    No results found for: SPEP, UPEP  Lab Results  Component Value Date   WBC 4.2 08/20/2017   NEUTROABS 2.7 08/20/2017   HGB 13.0 08/20/2017   HCT 39.7 08/20/2017   MCV 93.6 08/20/2017   PLT 77 (L) 08/20/2017      Chemistry      Component Value Date/Time   NA 142 08/20/2017 0737   NA 142 02/06/2017 0742   K 4.0 08/20/2017 0737   K 3.9 02/06/2017 0742   CL 105 08/20/2017 0737   CO2 26  08/20/2017 0737   CO2 22 02/06/2017 0742   BUN 15 08/20/2017 0737   BUN 13.5 02/06/2017 0742   CREATININE 0.81 08/20/2017 0737   CREATININE 0.8 02/06/2017 0742      Component Value Date/Time   CALCIUM 8.8 (L) 08/20/2017 0737   CALCIUM 8.5 02/06/2017 0742   ALKPHOS 99 08/20/2017 0737   ALKPHOS 130 02/06/2017 0742   AST 39 08/20/2017 0737   AST 47 (H) 02/06/2017 0742   ALT 38 08/20/2017 0737   ALT 54 02/06/2017 0742   BILITOT 0.9 08/20/2017 0737   BILITOT 0.65 02/06/2017 0742       RADIOGRAPHIC STUDIES: I have reviewed imaging study with the patient I have personally reviewed the radiological images as listed and agreed with the findings in the report. Ct Abdomen Pelvis W Contrast  Result Date: 08/20/2017 CLINICAL DATA:  Ovarian/peritoneal cancer with treatment in progress. EXAM: CT ABDOMEN AND PELVIS WITH CONTRAST TECHNIQUE: Multidetector CT imaging of the abdomen and pelvis was performed using the standard protocol following bolus administration of intravenous contrast. CONTRAST:  19m ISOVUE-300 IOPAMIDOL (ISOVUE-300) INJECTION 61% COMPARISON:  05/14/2017. FINDINGS: Lower chest: Lung bases show scattered scarring and volume loss. Heart is mildly enlarged. No pericardial or pleural effusion. Hepatobiliary: Liver is decreased in attenuation diffusely and the margin is irregular. Gallbladder is unremarkable. No biliary ductal dilatation. Pancreas: Negative. Spleen: Measures 16.1 x 5.7 x 16.4 cm (752 cc). Adrenals/Urinary Tract: Adrenal glands and right kidney are unremarkable. Left kidney is mildly atrophic. Ureters are decompressed. Bladder is grossly unremarkable. Stomach/Bowel: Stomach is decompressed. Small bowel and colon are unremarkable. Appendix is not readily visualized. Vascular/Lymphatic: Retroaortic left renal vein. Minimal atherosclerotic calcification of the aorta without aneurysm. Periportal and retroperitoneal lymph nodes are not enlarged by CT size criteria. Reproductive:  Hysterectomy.  No adnexal mass. Other: No free fluid. Very mild omental/mesenteric haziness, unchanged from prior exams. No discrete nodularity. Musculoskeletal: Degenerative changes in the spine. No worrisome lytic or sclerotic lesions. Prominent Schmorl's node along the superior endplate of L3 is again noted. IMPRESSION: 1. Mild omental/peritoneal haziness, unchanged. No evidence of new metastatic disease. 2. Cirrhosis with splenomegaly. Electronically Signed   By: MLorin PicketM.D.   On: 08/20/2017 13:56    All questions were answered. The patient knows to call the clinic with any problems, questions or concerns. No barriers to learning was detected.  I spent 15 minutes counseling the patient face to face. The total time spent in the appointment was 20 minutes and more than 50% was on counseling and review of test results  NHeath Lark MD 08/21/2017 8:24 AM

## 2017-08-21 NOTE — Telephone Encounter (Signed)
Gave avs and calendar ° °

## 2017-09-04 ENCOUNTER — Other Ambulatory Visit: Payer: Self-pay

## 2017-09-04 ENCOUNTER — Inpatient Hospital Stay: Payer: Medicare Other

## 2017-09-04 VITALS — BP 144/61 | HR 57 | Temp 97.7°F | Resp 18

## 2017-09-04 DIAGNOSIS — Z5112 Encounter for antineoplastic immunotherapy: Secondary | ICD-10-CM | POA: Diagnosis not present

## 2017-09-04 DIAGNOSIS — C569 Malignant neoplasm of unspecified ovary: Secondary | ICD-10-CM

## 2017-09-04 DIAGNOSIS — C482 Malignant neoplasm of peritoneum, unspecified: Secondary | ICD-10-CM

## 2017-09-04 DIAGNOSIS — D509 Iron deficiency anemia, unspecified: Secondary | ICD-10-CM

## 2017-09-04 LAB — CBC WITH DIFFERENTIAL/PLATELET
Basophils Absolute: 0 10*3/uL (ref 0.0–0.1)
Basophils Relative: 1 %
Eosinophils Absolute: 0.1 10*3/uL (ref 0.0–0.5)
Eosinophils Relative: 3 %
HCT: 37.8 % (ref 34.8–46.6)
Hemoglobin: 12.4 g/dL (ref 11.6–15.9)
Lymphocytes Relative: 30 %
Lymphs Abs: 1.1 10*3/uL (ref 0.9–3.3)
MCH: 29.9 pg (ref 25.1–34.0)
MCHC: 32.7 g/dL (ref 31.5–36.0)
MCV: 91.3 fL (ref 79.5–101.0)
Monocytes Absolute: 0.2 10*3/uL (ref 0.1–0.9)
Monocytes Relative: 4 %
Neutro Abs: 2.3 10*3/uL (ref 1.5–6.5)
Neutrophils Relative %: 62 %
Platelets: 76 10*3/uL — ABNORMAL LOW (ref 145–400)
RBC: 4.14 MIL/uL (ref 3.70–5.45)
RDW: 18 % — ABNORMAL HIGH (ref 11.2–14.5)
WBC: 3.7 10*3/uL — ABNORMAL LOW (ref 3.9–10.3)

## 2017-09-04 LAB — COMPREHENSIVE METABOLIC PANEL
ALT: 27 U/L (ref 0–44)
AST: 32 U/L (ref 15–41)
Albumin: 3.8 g/dL (ref 3.5–5.0)
Alkaline Phosphatase: 92 U/L (ref 38–126)
Anion gap: 11 (ref 5–15)
BUN: 19 mg/dL (ref 8–23)
CO2: 25 mmol/L (ref 22–32)
Calcium: 8.4 mg/dL — ABNORMAL LOW (ref 8.9–10.3)
Chloride: 107 mmol/L (ref 98–111)
Creatinine, Ser: 0.86 mg/dL (ref 0.44–1.00)
GFR calc Af Amer: >60 mL/min (ref >60)
GFR calc non Af Amer: >60 mL/min (ref >60)
Glucose, Bld: 108 mg/dL — ABNORMAL HIGH (ref 70–99)
Potassium: 4.2 mmol/L (ref 3.5–5.1)
Sodium: 143 mmol/L (ref 135–145)
Total Bilirubin: 1 mg/dL (ref 0.3–1.2)
Total Protein: 7.2 g/dL (ref 6.5–8.1)

## 2017-09-04 LAB — FERRITIN: Ferritin: 51 ng/mL (ref 11–307)

## 2017-09-04 LAB — IRON AND TIBC
Iron: 82 ug/dL (ref 41–142)
Saturation Ratios: 23 % (ref 21–57)
TIBC: 361 ug/dL (ref 236–444)
UIBC: 280 ug/dL

## 2017-09-04 LAB — TOTAL PROTEIN, URINE DIPSTICK: Protein, ur: NEGATIVE mg/dL

## 2017-09-04 MED ORDER — HYDROCODONE-ACETAMINOPHEN 5-325 MG PO TABS: ORAL_TABLET | ORAL | 0 refills | Status: DC

## 2017-09-04 MED ORDER — SODIUM CHLORIDE 0.9 % IV SOLN
900.0000 mg | Freq: Once | INTRAVENOUS | Status: AC
  Administered 2017-09-04: 900 mg via INTRAVENOUS
  Filled 2017-09-04: qty 4

## 2017-09-04 MED ORDER — SODIUM CHLORIDE 0.9 % IV SOLN
Freq: Once | INTRAVENOUS | Status: AC
  Filled 2017-09-04: qty 250

## 2017-09-04 MED ORDER — SODIUM CHLORIDE 0.9% FLUSH
10.0000 mL | INTRAVENOUS | Status: DC | PRN
  Administered 2017-09-04: 10 mL
  Filled 2017-09-04: qty 10

## 2017-09-04 MED ORDER — HEPARIN SOD (PORK) LOCK FLUSH 100 UNIT/ML IV SOLN
500.0000 [IU] | Freq: Once | INTRAVENOUS | Status: AC | PRN
  Administered 2017-09-04: 500 [IU]
  Filled 2017-09-04: qty 5

## 2017-09-04 MED ORDER — SODIUM CHLORIDE 0.9% FLUSH
10.0000 mL | Freq: Once | INTRAVENOUS | Status: AC
  Administered 2017-09-04: 10 mL
  Filled 2017-09-04: qty 10

## 2017-09-04 MED ORDER — SODIUM CHLORIDE 0.9 % IV SOLN
Freq: Once | INTRAVENOUS | Status: AC
  Administered 2017-09-04: 10:00:00 via INTRAVENOUS
  Filled 2017-09-04: qty 250

## 2017-09-04 NOTE — Progress Notes (Signed)
Dr. Alvy Bimler okay to tx with plt 30.

## 2017-09-04 NOTE — Patient Instructions (Signed)
[facility] Discharge Instructions for Patients Receiving Chemotherapy  Today you received the following chemotherapy agents:  bevacizumab (Avastin).  To help prevent nausea and vomiting after your treatment, we encourage you to take your nausea medication as directed.   If you develop nausea and vomiting that is not controlled by your nausea medication, call the clinic.   BELOW ARE SYMPTOMS THAT SHOULD BE REPORTED IMMEDIATELY:  *FEVER GREATER THAN 100.5 F  *CHILLS WITH OR WITHOUT FEVER  NAUSEA AND VOMITING THAT IS NOT CONTROLLED WITH YOUR NAUSEA MEDICATION  *UNUSUAL SHORTNESS OF BREATH  *UNUSUAL BRUISING OR BLEEDING  TENDERNESS IN MOUTH AND THROAT WITH OR WITHOUT PRESENCE OF ULCERS  *URINARY PROBLEMS  *BOWEL PROBLEMS  UNUSUAL RASH Items with * indicate a potential emergency and should be followed up as soon as possible.  Feel free to call the clinic should you have any questions or concerns. The clinic phone number is [phone].  Please show the [facility] at check-in to the Emergency Department and triage nurse.

## 2017-09-05 ENCOUNTER — Telehealth: Payer: Self-pay | Admitting: *Deleted

## 2017-09-05 LAB — CA 125: Cancer Antigen (CA) 125: 48.5 U/mL — ABNORMAL HIGH (ref 0.0–38.1)

## 2017-09-05 NOTE — Telephone Encounter (Signed)
-----   Message from [name], MD sent at 09/05/2017  7:55 AM EDT ----- Regarding: lab stable Let her know lab is stable ----- Message ----- From: Interface, Lab In [place] Sent: 09/04/2017   8:53 AM To: [name], MD

## 2017-09-05 NOTE — Telephone Encounter (Signed)
Notified of CA 125 results

## 2017-09-08 ENCOUNTER — Telehealth: Payer: Self-pay

## 2017-09-08 NOTE — Telephone Encounter (Signed)
Called back and given below message. She verbalized understanding. She is going to wait and discuss with Dr. Alvy Bimler on 8/22 appt. She is not sure she wants to stop Avastin to have surgery.

## 2017-09-08 NOTE — Telephone Encounter (Signed)
She called and left a message. She needs to have cataract surgery. Is it okay to have surgery with her treatment?

## 2017-09-08 NOTE — Telephone Encounter (Signed)
Surgery is not recommended while on Avastin. Will have to hold 6 weeks.

## 2017-09-18 ENCOUNTER — Inpatient Hospital Stay: Payer: Medicare Other

## 2017-09-18 ENCOUNTER — Inpatient Hospital Stay: Payer: Medicare Other | Attending: Hematology and Oncology

## 2017-09-18 VITALS — BP 134/67 | HR 60 | Temp 98.7°F | Resp 18

## 2017-09-18 DIAGNOSIS — Z7984 Long term (current) use of oral hypoglycemic drugs: Secondary | ICD-10-CM | POA: Diagnosis not present

## 2017-09-18 DIAGNOSIS — D696 Thrombocytopenia, unspecified: Secondary | ICD-10-CM | POA: Diagnosis not present

## 2017-09-18 DIAGNOSIS — R5383 Other fatigue: Secondary | ICD-10-CM | POA: Diagnosis not present

## 2017-09-18 DIAGNOSIS — Z79899 Other long term (current) drug therapy: Secondary | ICD-10-CM | POA: Insufficient documentation

## 2017-09-18 DIAGNOSIS — G2581 Restless legs syndrome: Secondary | ICD-10-CM | POA: Diagnosis not present

## 2017-09-18 DIAGNOSIS — M4800 Spinal stenosis, site unspecified: Secondary | ICD-10-CM | POA: Diagnosis not present

## 2017-09-18 DIAGNOSIS — Z95828 Presence of other vascular implants and grafts: Secondary | ICD-10-CM

## 2017-09-18 DIAGNOSIS — C569 Malignant neoplasm of unspecified ovary: Secondary | ICD-10-CM

## 2017-09-18 DIAGNOSIS — Z5112 Encounter for antineoplastic immunotherapy: Secondary | ICD-10-CM | POA: Diagnosis present

## 2017-09-18 DIAGNOSIS — R161 Splenomegaly, not elsewhere classified: Secondary | ICD-10-CM | POA: Diagnosis not present

## 2017-09-18 DIAGNOSIS — K769 Liver disease, unspecified: Secondary | ICD-10-CM | POA: Insufficient documentation

## 2017-09-18 DIAGNOSIS — C482 Malignant neoplasm of peritoneum, unspecified: Secondary | ICD-10-CM | POA: Diagnosis not present

## 2017-09-18 DIAGNOSIS — D509 Iron deficiency anemia, unspecified: Secondary | ICD-10-CM | POA: Insufficient documentation

## 2017-09-18 DIAGNOSIS — D72819 Decreased white blood cell count, unspecified: Secondary | ICD-10-CM | POA: Insufficient documentation

## 2017-09-18 DIAGNOSIS — I10 Essential (primary) hypertension: Secondary | ICD-10-CM | POA: Diagnosis not present

## 2017-09-18 LAB — CBC WITH DIFFERENTIAL/PLATELET
Basophils Absolute: 0 10*3/uL (ref 0.0–0.1)
Basophils Relative: 0 %
Eosinophils Absolute: 0.1 10*3/uL (ref 0.0–0.5)
Eosinophils Relative: 2 %
HCT: 40.2 % (ref 34.8–46.6)
Hemoglobin: 13 g/dL (ref 11.6–15.9)
Lymphocytes Relative: 31 %
Lymphs Abs: 1.2 10*3/uL (ref 0.9–3.3)
MCH: 30.4 pg (ref 25.1–34.0)
MCHC: 32.3 g/dL (ref 31.5–36.0)
MCV: 93.9 fL (ref 79.5–101.0)
Monocytes Absolute: 0.2 10*3/uL (ref 0.1–0.9)
Monocytes Relative: 5 %
Neutro Abs: 2.4 10*3/uL (ref 1.5–6.5)
Neutrophils Relative %: 62 %
Platelets: 78 10*3/uL — ABNORMAL LOW (ref 145–400)
RBC: 4.28 MIL/uL (ref 3.70–5.45)
RDW: 16.2 % — ABNORMAL HIGH (ref 11.2–14.5)
WBC: 3.9 10*3/uL (ref 3.9–10.3)

## 2017-09-18 LAB — COMPREHENSIVE METABOLIC PANEL
ALT: 39 U/L (ref 0–44)
AST: 42 U/L — ABNORMAL HIGH (ref 15–41)
Albumin: 3.8 g/dL (ref 3.5–5.0)
Alkaline Phosphatase: 99 U/L (ref 38–126)
Anion gap: 13 (ref 5–15)
BUN: 18 mg/dL (ref 8–23)
CO2: 23 mmol/L (ref 22–32)
Calcium: 9.4 mg/dL (ref 8.9–10.3)
Chloride: 106 mmol/L (ref 98–111)
Creatinine, Ser: 0.93 mg/dL (ref 0.44–1.00)
GFR calc Af Amer: >60 mL/min (ref >60)
GFR calc non Af Amer: >60 mL/min (ref >60)
Glucose, Bld: 118 mg/dL — ABNORMAL HIGH (ref 70–99)
Potassium: 4.6 mmol/L (ref 3.5–5.1)
Sodium: 142 mmol/L (ref 135–145)
Total Bilirubin: 1 mg/dL (ref 0.3–1.2)
Total Protein: 7.4 g/dL (ref 6.5–8.1)

## 2017-09-18 MED ORDER — SODIUM CHLORIDE 0.9 % IV SOLN
Freq: Once | INTRAVENOUS | Status: AC
  Administered 2017-09-18: 10:00:00 via INTRAVENOUS
  Filled 2017-09-18: qty 250

## 2017-09-18 MED ORDER — HEPARIN SOD (PORK) LOCK FLUSH 100 UNIT/ML IV SOLN
500.0000 [IU] | Freq: Once | INTRAVENOUS | Status: AC | PRN
  Administered 2017-09-18: 500 [IU]
  Filled 2017-09-18: qty 5

## 2017-09-18 MED ORDER — SODIUM CHLORIDE 0.9% FLUSH
10.0000 mL | INTRAVENOUS | Status: DC | PRN
  Administered 2017-09-18: 10 mL via INTRAVENOUS
  Filled 2017-09-18: qty 10

## 2017-09-18 MED ORDER — SODIUM CHLORIDE 0.9% FLUSH
10.0000 mL | INTRAVENOUS | Status: DC | PRN
  Administered 2017-09-18: 10 mL
  Filled 2017-09-18: qty 10

## 2017-09-18 MED ORDER — BEVACIZUMAB CHEMO INJECTION 400 MG/16ML
10.2000 mg/kg | Freq: Once | INTRAVENOUS | Status: AC
  Administered 2017-09-18: 900 mg via INTRAVENOUS
  Filled 2017-09-18: qty 32

## 2017-09-18 NOTE — Patient Instructions (Signed)
[facility] Discharge Instructions for Patients Receiving Chemotherapy  Today you received the following chemotherapy agents Avastin  To help prevent nausea and vomiting after your treatment, we encourage you to take your nausea medication as directed   If you develop nausea and vomiting that is not controlled by your nausea medication, call the clinic.   BELOW ARE SYMPTOMS THAT SHOULD BE REPORTED IMMEDIATELY:  *FEVER GREATER THAN 100.5 F  *CHILLS WITH OR WITHOUT FEVER  NAUSEA AND VOMITING THAT IS NOT CONTROLLED WITH YOUR NAUSEA MEDICATION  *UNUSUAL SHORTNESS OF BREATH  *UNUSUAL BRUISING OR BLEEDING  TENDERNESS IN MOUTH AND THROAT WITH OR WITHOUT PRESENCE OF ULCERS  *URINARY PROBLEMS  *BOWEL PROBLEMS  UNUSUAL RASH Items with * indicate a potential emergency and should be followed up as soon as possible.  Feel free to call the clinic should you have any questions or concerns. The clinic phone number is [phone].  Please show the CHEMO ALERT CARD at check-in to the Emergency Department and triage nurse.   

## 2017-10-01 ENCOUNTER — Other Ambulatory Visit: Payer: Self-pay | Admitting: Hematology and Oncology

## 2017-10-01 DIAGNOSIS — C482 Malignant neoplasm of peritoneum, unspecified: Secondary | ICD-10-CM

## 2017-10-02 ENCOUNTER — Telehealth: Payer: Self-pay | Admitting: Hematology and Oncology

## 2017-10-02 ENCOUNTER — Inpatient Hospital Stay: Payer: Medicare Other

## 2017-10-02 ENCOUNTER — Telehealth: Payer: Self-pay | Admitting: *Deleted

## 2017-10-02 ENCOUNTER — Inpatient Hospital Stay (HOSPITAL_BASED_OUTPATIENT_CLINIC_OR_DEPARTMENT_OTHER): Payer: Medicare Other | Admitting: Hematology and Oncology

## 2017-10-02 ENCOUNTER — Encounter: Payer: Self-pay | Admitting: Hematology and Oncology

## 2017-10-02 DIAGNOSIS — C482 Malignant neoplasm of peritoneum, unspecified: Secondary | ICD-10-CM

## 2017-10-02 DIAGNOSIS — D696 Thrombocytopenia, unspecified: Secondary | ICD-10-CM

## 2017-10-02 DIAGNOSIS — D509 Iron deficiency anemia, unspecified: Secondary | ICD-10-CM

## 2017-10-02 DIAGNOSIS — Z7984 Long term (current) use of oral hypoglycemic drugs: Secondary | ICD-10-CM

## 2017-10-02 DIAGNOSIS — C569 Malignant neoplasm of unspecified ovary: Secondary | ICD-10-CM

## 2017-10-02 DIAGNOSIS — R161 Splenomegaly, not elsewhere classified: Secondary | ICD-10-CM

## 2017-10-02 DIAGNOSIS — Z5112 Encounter for antineoplastic immunotherapy: Secondary | ICD-10-CM | POA: Diagnosis not present

## 2017-10-02 DIAGNOSIS — D72819 Decreased white blood cell count, unspecified: Secondary | ICD-10-CM | POA: Diagnosis not present

## 2017-10-02 DIAGNOSIS — M4800 Spinal stenosis, site unspecified: Secondary | ICD-10-CM

## 2017-10-02 DIAGNOSIS — K769 Liver disease, unspecified: Secondary | ICD-10-CM

## 2017-10-02 DIAGNOSIS — Z79899 Other long term (current) drug therapy: Secondary | ICD-10-CM

## 2017-10-02 DIAGNOSIS — G2581 Restless legs syndrome: Secondary | ICD-10-CM

## 2017-10-02 DIAGNOSIS — I10 Essential (primary) hypertension: Secondary | ICD-10-CM

## 2017-10-02 DIAGNOSIS — R5383 Other fatigue: Secondary | ICD-10-CM

## 2017-10-02 LAB — CBC WITH DIFFERENTIAL/PLATELET
Basophils Absolute: 0 10*3/uL (ref 0.0–0.1)
Basophils Relative: 0 %
Eosinophils Absolute: 0.1 10*3/uL (ref 0.0–0.5)
Eosinophils Relative: 2 %
HCT: 39.3 % (ref 34.8–46.6)
Hemoglobin: 12.5 g/dL (ref 11.6–15.9)
Lymphocytes Relative: 27 %
Lymphs Abs: 1.1 10*3/uL (ref 0.9–3.3)
MCH: 29.8 pg (ref 25.1–34.0)
MCHC: 31.8 g/dL (ref 31.5–36.0)
MCV: 93.8 fL (ref 79.5–101.0)
Monocytes Absolute: 0.2 10*3/uL (ref 0.1–0.9)
Monocytes Relative: 5 %
Neutro Abs: 2.7 10*3/uL (ref 1.5–6.5)
Neutrophils Relative %: 66 %
Platelets: 73 10*3/uL — ABNORMAL LOW (ref 145–400)
RBC: 4.19 MIL/uL (ref 3.70–5.45)
RDW: 16.1 % — ABNORMAL HIGH (ref 11.2–14.5)
WBC: 4.1 10*3/uL (ref 3.9–10.3)

## 2017-10-02 LAB — IRON AND TIBC
Iron: 85 ug/dL (ref 41–142)
Saturation Ratios: 22 % (ref 21–57)
TIBC: 381 ug/dL (ref 236–444)
UIBC: 297 ug/dL

## 2017-10-02 LAB — TOTAL PROTEIN, URINE DIPSTICK: Protein, ur: NEGATIVE mg/dL

## 2017-10-02 LAB — COMPREHENSIVE METABOLIC PANEL
ALT: 39 U/L (ref 0–44)
AST: 41 U/L (ref 15–41)
Albumin: 3.8 g/dL (ref 3.5–5.0)
Alkaline Phosphatase: 117 U/L (ref 38–126)
Anion gap: 11 (ref 5–15)
BUN: 19 mg/dL (ref 8–23)
CO2: 25 mmol/L (ref 22–32)
Calcium: 8.6 mg/dL — ABNORMAL LOW (ref 8.9–10.3)
Chloride: 108 mmol/L (ref 98–111)
Creatinine, Ser: 0.83 mg/dL (ref 0.44–1.00)
GFR calc Af Amer: >60 mL/min (ref >60)
GFR calc non Af Amer: >60 mL/min (ref >60)
Glucose, Bld: 120 mg/dL — ABNORMAL HIGH (ref 70–99)
Potassium: 4 mmol/L (ref 3.5–5.1)
Sodium: 144 mmol/L (ref 135–145)
Total Bilirubin: 0.9 mg/dL (ref 0.3–1.2)
Total Protein: 7.5 g/dL (ref 6.5–8.1)

## 2017-10-02 LAB — FERRITIN: Ferritin: 45 ng/mL (ref 11–307)

## 2017-10-02 MED ORDER — SODIUM CHLORIDE 0.9% FLUSH
10.0000 mL | Freq: Once | INTRAVENOUS | Status: AC
  Administered 2017-10-02: 10 mL
  Filled 2017-10-02: qty 10

## 2017-10-02 MED ORDER — HYDROCODONE-ACETAMINOPHEN 5-325 MG PO TABS: ORAL_TABLET | ORAL | 0 refills | Status: DC

## 2017-10-02 NOTE — Telephone Encounter (Signed)
-----   Message from [name], MD sent at 10/02/2017 10:37 AM EDT ----- Regarding: iron ok Tell her iron level is ok No need IV iron ----- Message ----- From: Interface, Lab In [place] Sent: 10/02/2017   8:35 AM EDT To: [name], MD

## 2017-10-02 NOTE — Telephone Encounter (Signed)
Notified of message below

## 2017-10-02 NOTE — Telephone Encounter (Signed)
Gave patient avs and calendar.   °

## 2017-10-02 NOTE — Assessment & Plan Note (Signed)
She has symptoms of restless leg but iron panel is normal and she is not anemic I do not recommend intravenous iron replacement therapy I plan to recheck that again in her next visit

## 2017-10-02 NOTE — Assessment & Plan Note (Signed)
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 She is not symptomatic.  Recommend close observation only

## 2017-10-02 NOTE — Progress Notes (Signed)
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[facility]) So far, she have no clinical signs of disease progression Her tumor marker is stable The patient desired to have cataract surgery I recommend holding a Avastin I recommend holding for minimum 8 weeks from her previous dose She can go for cataract surgery at the beginning of October The rationale behind this is to minimize risk of poor wound healing and bleeding with invasive procedures I will see her back in 6 weeks for further appointment, history, physical examination and follow-up I plan to repeat CT imaging in November  Thrombocytopenia [facility]) The intermittent leukopenia 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 She is not symptomatic.  Recommend close observation only  Iron deficiency anemia She has symptoms of restless leg but iron panel is normal and she is not anemic I do not recommend intravenous iron replacement therapy I plan to recheck that again in her next visit   Essential hypertension Her blood pressure is elevated, likely due to excessive salt intake and side effects of treatment She is not symptomatic She does not have proteinuria Since we will be holding her Avastin, I recommend close observation only with dietary modification   No orders of the defined types were placed in this encounter.   INTERVAL HISTORY: Please see below for problem oriented charting. 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  No worrisome liver mass.  There are associated portal venous collaterals and splenomegaly consistent with portal venous hypertension. 2.  No other significant upper abdominal findings    11/08/2013 Imaging    US showed ascites    11/08/2013 Initial Diagnosis    Patient had ~ 2 months of some urinary incontinence, saw Dr Ronita Hipps in 10-2013, with pelvic mass on exam     11/12/2013 Imaging    There are findings of progressive hepatic cirrhosis with a large amount of ascites and mild splenomegaly. There are venous collaterals present. 2. There is abnormal thickening of the peritoneal surface along the left mid and lower abdominal wall. There is abnormal soft tissue density in the pelvis as well which could reflect the clinically suspected ovarian malignancy but the findings are nondiagnostic. 3. There is a new small left pleural effusion. 4. There is no acute bowel abnormality.    11/18/2013 Imaging    Successful ultrasound guided diagnostic and therapeutic paracentesis yielding 4.1 liters of ascites.     11/18/2013 Pathology Results    PERITONEAL/ASCITIC FLUID (SPECIMEN 1 OF 1 COLLECTED 11/18/13): SEROUS CARCINOMA, PLEASE SEE COMMENT.    12/01/2013 Tumor Marker    Patient's tumor was tested for the following markers: CA125 Results of the tumor marker test revealed 1938    12/03/2013 Imaging    Successful ultrasound-guided therapeutic paracentesis yielding 3.6 liters of peritoneal  fluid.    12/07/2013 - 05/30/2014 Chemotherapy    She received 3 cycles of carboplatin and Taxol, interrupted cycle 4 for surgery.  She subsequently completed 3 more cycles of chemotherapy after surgery    12/20/2013  Imaging    Successful ultrasound-guided diagnostic and therapeutic paracentesis yielding 2.2 liters of peritoneal fluid    12/20/2013 Pathology Results    PERITONEAL/ASCITIC FLUID(SPECIMEN 1 OF 1 COLLECTED 12/20/13): MALIGNANT CELLS CONSISTENT WITH SEROUS CARCINOMA    01/13/2014 Tumor Marker    Patient's tumor was tested for the following markers: CA125 Results of the tumor marker test revealed 227    02/07/2014 Tumor Marker    Patient's tumor was tested for the following markers: CA125 Results of the tumor marker test revealed 130    02/15/2014 Genetic Testing    Genetics testing from 02-2014 normal ([employer] panel)    02/23/2014 Imaging    Interval decrease in ascites. 2. Morphologic changes in the liver consistent with cirrhosis. 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 Portal vein remains patent at this time. 3. Persistent but decreased abnormal soft tissue attenuation tracking in the omental fat. This may be secondary to interval improvement in metastatic disease. 4. Peritoneal thickening seen in the left abdomen and pelvis on the previous study has decreased and nearly resolved in the interval. This also suggests interval improvement in metastatic disease.     03/07/2014 Procedure    Ultrasound and fluoroscopically guided right internal jugular single lumen power port catheter insertion. Tip in the SVC/RA junction. Catheter ready for use.    03/17/2014 Tumor Marker    Patient's tumor was tested for the following markers: CA125 Results of the tumor marker test revealed 45    03/29/2014 Pathology Results    1. Omentum, resection for tumor - HIGH GRADE SEROUS CARCINOMA, SEE COMMENT. 2. Ovary and fallopian tube, right - HIGH GRADE SEROUS CARCINOMA, SEE COMMENT. 3. Ovary and fallopian tube, left - HIGH GRADE SEROUS CARCINOMA, SEE COMMENT. Diagnosis Note 1. Nests and clusters of malignant cells are invading the omental tissue (part #1) with associated  fibrosis. The cells are pleomorphic with prominent nucleoli. There are scattered psammoma bodies. The ovaries are atrophic and exhibit multiple foci of surface based invasive carcinoma and associated fibrosis. The fallopian tubes have a few foci of carcinoma, also superficially located. There are no precursor lesions noted in the ovaries or fallopian tubes (the entire tubes and ovaries were submitted for evaluation). While there is some retraction artifact, there are several foci suspicious for lymphovascular invasion. 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 Given the fibrosis, there does appear to be a small amount of treatment effect, however, there is abundant residual tumor.     03/29/2014 Surgery    Procedure(s) Performed: 1. Exploratory laparotomy with bilateral salpingo-oophorectomy, omentectomy radical tumor debulking for ovarian cancer .  Surgeon: Thereasa Solo, MD.  Assistant Surgeon: Lahoma Crocker, M.D. Assistant: (an MD assistant was necessary for tissue manipulation, retraction and positioning due to the complexity of the case and hospital policies).  Operative Findings: 10cm omental cake from hepatic to splenic flexure, densely adherent to transverse colon. Milliary studding of tumor implants (<5m, too numerous in number to count) adherent to the mesentery of the small bowel and small bowel wall. Small volume (100cc) ascites. Small ovaries bilaterally, densely adherent to the pelvic cul de sac. Left ovary and  tube densely adherent to sigmoid colon. Cirrhotic liver with hepatomegaly and splenomegaly.     This represented an optimal cytoreduction (R1) with visible disease residual on the bowel wall and mesentery (millial, <62m implants).      04/18/2014 Tumor Marker    Patient's tumor was tested for the following markers: CA125 Results of the tumor  marker test revealed 122    05/16/2014 Tumor Marker    Patient's tumor was tested for the following markers: CA125 Results of the tumor marker test revealed 30    06/10/2014 Tumor Marker    Patient's tumor was tested for the following markers: CA125 Results of the tumor marker test revealed 19    07/04/2014 Imaging    Interval improvement in the appearance of peritoneal metastasis secondary to ovarian cancer. 2. 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     08/18/2014 Tumor Marker    Patient's tumor was tested for the following markers: CA125 Results of the tumor marker test revealed 16    10/13/2014 Tumor Marker    Patient's tumor was tested for the following markers: CA125 Results of the tumor marker test revealed 14    11/21/2014 Tumor Marker    Patient's tumor was tested for the following markers: CA125 Results of the tumor marker test revealed 16    12/28/2014 Tumor Marker    Patient's tumor was tested for the following markers: CA125 Results of the tumor marker test revealed 762    02/27/2015 Tumor Marker    Patient's tumor was tested for the following markers: CA125 Results of the tumor marker test revealed 20.2    03/22/2015 Imaging    Filler appearance to the prior exam, with very subtle fluid tracking along portions of the liver, and very minimal nodularity along the right paracolic gutter representing residua from the prior peritoneal cancer. The current abnormalities could simply be post therapy findings rather than necessarily representing residual malignancy. No new or enlarging lesions are identified. 2. Hepatic cirrhosis and splenomegaly. There is some gastric varices suggesting portal venous hypertension. 3. Left foraminal stenosis at L4-5 due to spurring. There is likely also some central narrowing of the thecal sac at this level. 4. Bibasilar scarring. 5. Chronic mild left mid kidney scarring    03/27/2015 Tumor Marker    Patient's tumor was tested  for the following markers: CA125 Results of the tumor marker test revealed 25.3    04/24/2015 Imaging    Disc bulge L2-3 with mild spinal stenosis and right foraminal encroachment. 2. Disc bulge L3-4 with mild spinal stenosis. 3. Multifactorial spinal, left lateral recess and foraminal stenosis L4-5. There is grade 1 anterolisthesis without evident dynamic instability.    05/29/2015 Tumor Marker    Patient's tumor was tested for the following markers: CA125 Results of the tumor marker test revealed 29.9    08/21/2015 Tumor Marker    Patient's tumor was tested for the following markers: CA125 Results of the tumor marker test revealed 45    09/18/2015 Tumor Marker    Patient's tumor was tested for the following markers: CA125 Results of the tumor marker test revealed 47    09/27/2015 Imaging    New small bowel mesenteric nodule and enlarged left external iliac lymph node, highly worrisome for metastatic disease. 2. Cirrhosis and splenomegaly    10/10/2015 Tumor Marker    Patient's tumor was tested for the following markers: CA125 Results of the tumor marker test revealed 53.4    10/10/2015 - 12/11/2015 Chemotherapy  She received 4 cycles of carboplatin only    11/20/2015 Tumor Marker    Patient's tumor was tested for the following markers: CA125 Results of the tumor marker test revealed 41.3    12/20/2015 Imaging    CT: Mild left external iliac lymphadenopathy is slightly decreased. Mildly enlarged lower mesenteric nodule is slightly decreased. 2. No new or progressive metastatic disease in the abdomen or pelvis. 3. Cirrhosis. Stable subcentimeter hypodense left liver lobe lesion. No new liver lesions. 4. Stable mild splenomegaly.  No ascites. 5. Mild sigmoid diverticulosis.    01/01/2016 -  Anti-estrogen oral therapy    She was placed on tamoxifen. Had letrozole initially but switched to tamoxifen due to poor tolerance    01/15/2016 Tumor Marker    Patient's tumor was tested for the  following markers: CA125 Results of the tumor marker test revealed 31.4    02/19/2016 Tumor Marker    Patient's tumor was tested for the following markers: CA125 Results of the tumor marker test revealed 36.5    05/13/2016 Imaging    No evidence of disease progression within the abdomen or pelvis. Previously noted small central mesenteric nodule and left external iliac node appear slightly smaller. 2. Stable changes of hepatic cirrhosis and portal hypertension with associated splenomegaly. No new or enlarging hepatic lesions are identified. 3. No acute findings.    05/13/2016 Tumor Marker    Patient's tumor was tested for the following markers: CA125 Results of the tumor marker test revealed 41.2    06/24/2016 Tumor Marker    Patient's tumor was tested for the following markers: CA125 Results of the tumor marker test revealed 47.3    08/20/2016 Imaging    1. The left external iliac lymph node is slightly increased in size compared to the prior exam, currently 1.1 cm and previously 0.9 cm in diameter. 2. Stable central mesenteric lymph node at 0.9 cm in diameter. 3. Stable trace free pelvic fluid and stable slight thickening along the right paracolic gutter, without well-defined peritoneal nodularity. 4. Other imaging findings of potential clinical significance: Subsegmental atelectasis or scarring in the lung bases. Hepatic cirrhosis. Left renal scarring. Mild splenomegaly. Sigmoid colon diverticulosis. Impingement at L4-5 due to spondylosis and degenerative disc disease broad Schmorl' s nodes at L3-L4. Pelvic floor laxity    08/23/2016 Tumor Marker    Patient's tumor was tested for the following markers: CA125 Results of the tumor marker test revealed 80.2    09/04/2016 Imaging    LV EF: 60% -   65%    09/12/2016 Tumor Marker    Patient's tumor was tested for the following markers: CA125 Results of the tumor marker test revealed 98.5    09/12/2016 -  Chemotherapy    The patient received Doxil  and Avastin.     09/26/2016 Tumor Marker    Patient's tumor was tested for the following markers: CA125 Results of the tumor marker test revealed 68.9    10/10/2016 Tumor Marker    Patient's tumor was tested for the following markers: CA125 Results of the tumor marker test revealed 65.6    11/07/2016 Tumor Marker    Patient's tumor was tested for the following markers: CA125 Results of the tumor marker test revealed 46.4    11/29/2016 Imaging    Stable mild peritoneal thickening, suspicious for peritoneal carcinomatosis. No new or progressive disease identified. No evidence of ascites.  No significant change in 10 mm left external iliac and 8 mm mesenteric lymph nodes.  Stable hepatic  cirrhosis, and splenomegaly consistent with portal venous hypertension.  Colonic diverticulosis, without radiographic evidence of diverticulitis.    12/02/2016 Imaging    Normal LV size with EF 55%. Basal inferior and basal inferoseptal hypokinesis. Normal RV size and systolic function. No significant valvular abnormalities.    12/05/2016 Tumor Marker    Patient's tumor was tested for the following markers: CA125 Results of the tumor marker test revealed 49.1    01/09/2017 Tumor Marker    Patient's tumor was tested for the following markers: CA125 Results of the tumor marker test revealed 47.2    02/06/2017 Tumor Marker    Patient's tumor was tested for the following markers: CA125 Results of the tumor marker test revealed 44.1    02/18/2017 Imaging    Stable mild peritoneal thickening. No new or progressive disease identified within the abdomen or pelvis.  Stable tiny sub-cm left external iliac and mesenteric lymph nodes.  Stable hepatic cirrhosis, and splenomegaly consistent with portal venous hypertension. No evidence of hepatic neoplasm.  Colonic diverticulosis, without radiographic evidence of diverticulitis.    03/06/2017 Tumor Marker    Patient's tumor was tested for the following  markers: CA125 Results of the tumor marker test revealed 50.4    03/17/2017 Imaging    - Left ventricle: The cavity size was normal. Wall thickness was normal. Systolic function was normal. The estimated ejection fraction was in the range of 55% to 60%. Wall motion was normal; there were no regional wall motion abnormalities. Features are consistent with a pseudonormal left ventricular filling pattern, with concomitant abnormal relaxation and increased filling pressure (grade 2 diastolic dysfunction). - Mitral valve: There was mild regurgitation. - Left atrium: The atrium was mildly dilated    04/03/2017 Tumor Marker    Patient's tumor was tested for the following markers: CA125 Results of the tumor marker test revealed 47.2    05/14/2017 Imaging    CT scan of abdomen and pelvis Stable mild peritoneal thickening. No new or progressive disease within the abdomen or pelvis.  Stable hepatic cirrhosis. Stable splenomegaly, consistent with portal venous hypertension. No evidence of hepatic neoplasm.    05/15/2017 Tumor Marker    Patient's tumor was tested for the following markers: CA125 Results of the tumor marker test revealed 56.1    06/12/2017 Tumor Marker    Patient's tumor was tested for the following markers: CA125 Results of the tumor marker test revealed 49.2    07/10/2017 Tumor Marker    Patient's tumor was tested for the following markers: CA125 Results of the tumor marker test revealed 46.3    08/07/2017 Tumor Marker    Patient's tumor was tested for the following markers: CA125 Results of the tumor marker test revealed 52.9    08/20/2017 Imaging    1. Mild omental/peritoneal haziness, unchanged. No evidence of new metastatic disease. 2. Cirrhosis with splenomegaly.    09/04/2017 Tumor Marker    Patient's tumor was tested for the following markers: CA125 Results of the tumor marker test revealed 48.5     REVIEW OF SYSTEMS:   Constitutional: Denies fevers, chills or abnormal  weight loss Eyes: Denies blurriness of vision Ears, nose, mouth, throat, and face: Denies mucositis or sore throat Respiratory: Denies cough, dyspnea or wheezes Cardiovascular: Denies palpitation, chest discomfort or lower extremity swelling Gastrointestinal:  Denies nausea, heartburn or change in bowel habits Skin: Denies abnormal skin rashes Lymphatics: Denies new lymphadenopathy or easy bruising Neurological:Denies numbness, tingling or new weaknesses Behavioral/Psych: Mood is stable, no new changes  All other systems were reviewed with the patient and are negative.  I have reviewed the past medical history, past surgical history, social history and family history with the patient and they are unchanged from previous note.  ALLERGIES:  is allergic to shellfish allergy; benadryl [diphenhydramine hcl]; penicillins; and tape.  MEDICATIONS:  Current Outpatient Medications  Medication Sig Dispense Refill  . bisoprolol (ZEBETA) 10 MG tablet Take 1 tablet (10 mg total) by mouth daily. 30 tablet 11  . cholecalciferol (VITAMIN D) 1000 units tablet Take 1,000 Units by mouth daily.    Marland Kitchen gabapentin (NEURONTIN) 300 MG capsule Take 2 capsules (600 mg total) by mouth 2 (two) times daily. 120 capsule 11  . hydrochlorothiazide (MICROZIDE) 12.5 MG capsule Take 12.5 mg by mouth daily.  1  . HYDROcodone-acetaminophen (NORCO/VICODIN) 5-325 MG tablet take 1 tablet by mouth every 6 hours if needed for MODERATE PAIN 60 tablet 0  . lidocaine-prilocaine (EMLA) cream Apply to Porta-cath  1-2 hours prior to access as directed. 30 g 1  . metFORMIN (GLUCOPHAGE-XR) 500 MG 24 hr tablet Take 1,000 mg by mouth 2 (two) times daily.   0  . ondansetron (ZOFRAN) 8 MG tablet Take 1 tablet (8 mg total) by mouth every 8 (eight) hours as needed for nausea. 30 tablet 3  . pantoprazole (PROTONIX) 40 MG tablet Take 40 mg by mouth daily.   0  . promethazine (PHENERGAN) 25 MG tablet Take 1 tablet (25 mg total) by mouth every 6  (six) hours as needed for nausea. 30 tablet 3  . rotigotine (NEUPRO) 4 MG/24HR Place 1 patch onto the skin daily. 30 patch 6   No current facility-administered medications for this visit.     PHYSICAL EXAMINATION: ECOG PERFORMANCE STATUS: 1 - Symptomatic but completely ambulatory  Vitals:   10/02/17 0923  BP: (!) 154/67  Pulse: 61  Resp: 18  Temp: 97.6 F (36.4 C)  SpO2: 100%   Filed Weights   10/02/17 0923  Weight: 186 lb 9.6 oz (84.6 kg)    GENERAL:alert, no distress and comfortable SKIN: skin color, texture, turgor are normal, no rashes or significant lesions EYES: normal, Conjunctiva are pink and non-injected, sclera clear OROPHARYNX:no exudate, no erythema and lips, buccal mucosa, and tongue normal  NECK: supple, thyroid normal size, non-tender, without nodularity LYMPH:  no palpable lymphadenopathy in the cervical, axillary or inguinal LUNGS: clear to auscultation and percussion with normal breathing effort HEART: regular rate & rhythm and no murmurs and no lower extremity edema ABDOMEN:abdomen soft, non-tender and normal bowel sounds Musculoskeletal:no cyanosis of digits and no clubbing  NEURO: alert & oriented x 3 with fluent speech, no focal motor/sensory deficits  LABORATORY DATA:  I have reviewed the data as listed    Component Value Date/Time   NA 144 10/02/2017 0813   NA 142 02/06/2017 0742   K 4.0 10/02/2017 0813   K 3.9 02/06/2017 0742   CL 108 10/02/2017 0813   CO2 25 10/02/2017 0813   CO2 22 02/06/2017 0742   GLUCOSE 120 (H) 10/02/2017 0813   GLUCOSE 156 (H) 02/06/2017 0742   BUN 19 10/02/2017 0813   BUN 13.5 02/06/2017 0742   CREATININE 0.83 10/02/2017 0813   CREATININE 0.8 02/06/2017 0742   CALCIUM 8.6 (L) 10/02/2017 0813   CALCIUM 8.5 02/06/2017 0742   PROT 7.5 10/02/2017 0813   PROT 6.9 02/06/2017 0742   ALBUMIN 3.8 10/02/2017 0813   ALBUMIN 3.7 02/06/2017 0742   AST 41 10/02/2017 0813  AST 47 (H) 02/06/2017 0742   ALT 39 10/02/2017  0813   ALT 54 02/06/2017 0742   ALKPHOS 117 10/02/2017 0813   ALKPHOS 130 02/06/2017 0742   BILITOT 0.9 10/02/2017 0813   BILITOT 0.65 02/06/2017 0742   GFRNONAA >60 10/02/2017 0813   GFRAA >60 10/02/2017 0813    No results found for: SPEP, UPEP  Lab Results  Component Value Date   WBC 4.1 10/02/2017   NEUTROABS 2.7 10/02/2017   HGB 12.5 10/02/2017   HCT 39.3 10/02/2017   MCV 93.8 10/02/2017   PLT 73 (L) 10/02/2017      Chemistry      Component Value Date/Time   NA 144 10/02/2017 0813   NA 142 02/06/2017 0742   K 4.0 10/02/2017 0813   K 3.9 02/06/2017 0742   CL 108 10/02/2017 0813   CO2 25 10/02/2017 0813   CO2 22 02/06/2017 0742   BUN 19 10/02/2017 0813   BUN 13.5 02/06/2017 0742   CREATININE 0.83 10/02/2017 0813   CREATININE 0.8 02/06/2017 0742      Component Value Date/Time   CALCIUM 8.6 (L) 10/02/2017 0813   CALCIUM 8.5 02/06/2017 0742   ALKPHOS 117 10/02/2017 0813   ALKPHOS 130 02/06/2017 0742   AST 41 10/02/2017 0813   AST 47 (H) 02/06/2017 0742   ALT 39 10/02/2017 0813   ALT 54 02/06/2017 0742   BILITOT 0.9 10/02/2017 0813   BILITOT 0.65 02/06/2017 0742       All questions were answered. The patient knows to call the clinic with any problems, questions or concerns. No barriers to learning was detected.  I spent 15 minutes counseling the patient face to face. The total time spent in the appointment was 20 minutes and more than 50% was on counseling and review of test results  [name], MD 10/02/2017 11:19 AM

## 2017-10-02 NOTE — Assessment & Plan Note (Signed)
Her blood pressure is elevated, likely due to excessive salt intake and side effects of treatment She is not symptomatic She does not have proteinuria Since we will be holding her Avastin, I recommend close observation only with dietary modification

## 2017-10-02 NOTE — Assessment & Plan Note (Signed)
So far, she have no clinical signs of disease progression Her tumor marker is stable The patient desired to have cataract surgery I recommend holding a Avastin I recommend holding for minimum 8 weeks from her previous dose She can go for cataract surgery at the beginning of October The rationale behind this is to minimize risk of poor wound healing and bleeding with invasive procedures I will see her back in 6 weeks for further appointment, history, physical examination and follow-up I plan to repeat CT imaging in November

## 2017-10-22 ENCOUNTER — Inpatient Hospital Stay: Payer: Medicare Other | Attending: Hematology and Oncology

## 2017-10-22 ENCOUNTER — Telehealth: Payer: Self-pay | Admitting: *Deleted

## 2017-10-22 ENCOUNTER — Other Ambulatory Visit: Payer: Self-pay | Admitting: *Deleted

## 2017-10-22 DIAGNOSIS — C569 Malignant neoplasm of unspecified ovary: Secondary | ICD-10-CM

## 2017-10-22 DIAGNOSIS — R300 Dysuria: Secondary | ICD-10-CM

## 2017-10-22 DIAGNOSIS — N390 Urinary tract infection, site not specified: Secondary | ICD-10-CM

## 2017-10-22 LAB — URINALYSIS, COMPLETE (UACMP) WITH MICROSCOPIC
Bilirubin Urine: NEGATIVE
Glucose, UA: NEGATIVE mg/dL
Ketones, ur: NEGATIVE mg/dL
Nitrite: POSITIVE — AB
Protein, ur: NEGATIVE mg/dL
Specific Gravity, Urine: 1.015 (ref 1.005–1.030)
WBC, UA: >50 WBC/hpf — ABNORMAL HIGH (ref 0–5)
pH: 5 (ref 5.0–8.0)

## 2017-10-22 MED ORDER — CIPROFLOXACIN HCL 500 MG PO TABS: 500.0000 mg | ORAL_TABLET | Freq: Two times a day (BID) | ORAL | 0 refills | Status: DC

## 2017-10-22 NOTE — Telephone Encounter (Signed)
OK, I will follow on cultures I will not start antibiotics for now

## 2017-10-22 NOTE — Telephone Encounter (Signed)
TC from patient stating that she is having S/S of UTI - painful urination, darker urine, lower abd discomfort.  Denies fever or chills.  She states she feels fatigued.  She has called her PCP but he is not in the office today.  She wanted to know if she could give a urine sample here today, to check for UTI.   Scheduling message sent for lab appt today for urinalysis and urine culture.  Spoke with Dr. Alvy Bimler nurse to make her aware.

## 2017-10-22 NOTE — Telephone Encounter (Signed)
-----   Message from [name], MD sent at 10/22/2017 11:46 AM EDT ----- Regarding: UTI Looks like UTI Call in cipro 500 mg BID PO x 3 days no refills ----- Message ----- From: Interface, Lab In [place] Sent: 10/22/2017  11:12 AM EDT To: [name], MD

## 2017-10-22 NOTE — Telephone Encounter (Signed)
Per Dr. Alvy Bimler, I informed patient that she had a UTI. She needs to start Cipro today. Prescription E-scribed to her pharmacy. She verbalized understanding.

## 2017-10-24 ENCOUNTER — Telehealth: Payer: Self-pay | Admitting: *Deleted

## 2017-10-24 LAB — URINE CULTURE: Culture: >=100000 — AB

## 2017-10-24 NOTE — Telephone Encounter (Signed)
Per Dr. Alvy Bimler, I called and spoke to the patient regarding her UTI. She stated,"I am much better. She denied pain, burning and I'm still taking the antibiotics. I"ll call you if I need you."

## 2017-10-24 NOTE — Telephone Encounter (Signed)
-----   Message from [name], MD sent at 10/24/2017  7:30 AM EDT ----- Regarding: U Cx Let her know Ucx positive, sensitive to cipro Can you call and ask how she is doing>? ----- Message ----- From: Interface, Lab In [place] Sent: 10/22/2017  11:12 AM EDT To: [name], MD

## 2017-10-27 ENCOUNTER — Telehealth: Payer: Self-pay

## 2017-10-27 NOTE — Telephone Encounter (Signed)
Misty at [facility] called and left a message. Is it okay to have a dental cleaning and see if she needs anything else?

## 2017-10-28 NOTE — Telephone Encounter (Signed)
OK for dental cleaning, no dental extractions

## 2017-10-30 NOTE — Telephone Encounter (Signed)
Called back and given below message to [facility] at Dr[facility] office. She verbalized understanding.

## 2017-11-13 ENCOUNTER — Inpatient Hospital Stay: Payer: Medicare Other

## 2017-11-13 ENCOUNTER — Telehealth: Payer: Self-pay | Admitting: Hematology and Oncology

## 2017-11-13 ENCOUNTER — Inpatient Hospital Stay (HOSPITAL_BASED_OUTPATIENT_CLINIC_OR_DEPARTMENT_OTHER): Payer: Medicare Other | Admitting: Hematology and Oncology

## 2017-11-13 ENCOUNTER — Inpatient Hospital Stay: Payer: Medicare Other | Attending: Hematology and Oncology

## 2017-11-13 DIAGNOSIS — D696 Thrombocytopenia, unspecified: Secondary | ICD-10-CM | POA: Insufficient documentation

## 2017-11-13 DIAGNOSIS — Z79899 Other long term (current) drug therapy: Secondary | ICD-10-CM | POA: Diagnosis not present

## 2017-11-13 DIAGNOSIS — Z9221 Personal history of antineoplastic chemotherapy: Secondary | ICD-10-CM

## 2017-11-13 DIAGNOSIS — I10 Essential (primary) hypertension: Secondary | ICD-10-CM

## 2017-11-13 DIAGNOSIS — Z7984 Long term (current) use of oral hypoglycemic drugs: Secondary | ICD-10-CM | POA: Diagnosis not present

## 2017-11-13 DIAGNOSIS — J90 Pleural effusion, not elsewhere classified: Secondary | ICD-10-CM

## 2017-11-13 DIAGNOSIS — Z23 Encounter for immunization: Secondary | ICD-10-CM | POA: Diagnosis not present

## 2017-11-13 DIAGNOSIS — G8929 Other chronic pain: Secondary | ICD-10-CM

## 2017-11-13 DIAGNOSIS — Z923 Personal history of irradiation: Secondary | ICD-10-CM | POA: Insufficient documentation

## 2017-11-13 DIAGNOSIS — R971 Elevated cancer antigen 125 [CA 125]: Secondary | ICD-10-CM | POA: Insufficient documentation

## 2017-11-13 DIAGNOSIS — G2581 Restless legs syndrome: Secondary | ICD-10-CM | POA: Diagnosis not present

## 2017-11-13 DIAGNOSIS — C482 Malignant neoplasm of peritoneum, unspecified: Secondary | ICD-10-CM | POA: Diagnosis present

## 2017-11-13 DIAGNOSIS — M545 Low back pain, unspecified: Secondary | ICD-10-CM

## 2017-11-13 DIAGNOSIS — M129 Arthropathy, unspecified: Secondary | ICD-10-CM

## 2017-11-13 DIAGNOSIS — M2550 Pain in unspecified joint: Secondary | ICD-10-CM

## 2017-11-13 DIAGNOSIS — R188 Other ascites: Secondary | ICD-10-CM | POA: Insufficient documentation

## 2017-11-13 DIAGNOSIS — R161 Splenomegaly, not elsewhere classified: Secondary | ICD-10-CM | POA: Insufficient documentation

## 2017-11-13 DIAGNOSIS — C569 Malignant neoplasm of unspecified ovary: Secondary | ICD-10-CM

## 2017-11-13 DIAGNOSIS — D509 Iron deficiency anemia, unspecified: Secondary | ICD-10-CM | POA: Diagnosis not present

## 2017-11-13 DIAGNOSIS — K7460 Unspecified cirrhosis of liver: Secondary | ICD-10-CM

## 2017-11-13 LAB — CBC WITH DIFFERENTIAL/PLATELET
Basophils Absolute: 0 10*3/uL (ref 0.0–0.1)
Basophils Relative: 0 %
Eosinophils Absolute: 0.1 10*3/uL (ref 0.0–0.5)
Eosinophils Relative: 2 %
HCT: 38.1 % (ref 34.8–46.6)
Hemoglobin: 12.5 g/dL (ref 11.6–15.9)
Lymphocytes Relative: 28 %
Lymphs Abs: 1.2 10*3/uL (ref 0.9–3.3)
MCH: 29.8 pg (ref 25.1–34.0)
MCHC: 32.7 g/dL (ref 31.5–36.0)
MCV: 91 fL (ref 79.5–101.0)
Monocytes Absolute: 0.2 10*3/uL (ref 0.1–0.9)
Monocytes Relative: 5 %
Neutro Abs: 2.7 10*3/uL (ref 1.5–6.5)
Neutrophils Relative %: 65 %
Platelets: 80 10*3/uL — ABNORMAL LOW (ref 145–400)
RBC: 4.18 MIL/uL (ref 3.70–5.45)
RDW: 16.8 % — ABNORMAL HIGH (ref 11.2–14.5)
WBC: 4.1 10*3/uL (ref 3.9–10.3)

## 2017-11-13 LAB — COMPREHENSIVE METABOLIC PANEL
ALT: 36 U/L (ref 0–44)
AST: 36 U/L (ref 15–41)
Albumin: 3.8 g/dL (ref 3.5–5.0)
Alkaline Phosphatase: 118 U/L (ref 38–126)
Anion gap: 10 (ref 5–15)
BUN: 15 mg/dL (ref 8–23)
CO2: 26 mmol/L (ref 22–32)
Calcium: 8.5 mg/dL — ABNORMAL LOW (ref 8.9–10.3)
Chloride: 107 mmol/L (ref 98–111)
Creatinine, Ser: 0.84 mg/dL (ref 0.44–1.00)
GFR calc Af Amer: >60 mL/min (ref >60)
GFR calc non Af Amer: >60 mL/min (ref >60)
Glucose, Bld: 201 mg/dL — ABNORMAL HIGH (ref 70–99)
Potassium: 4.1 mmol/L (ref 3.5–5.1)
Sodium: 143 mmol/L (ref 135–145)
Total Bilirubin: 0.9 mg/dL (ref 0.3–1.2)
Total Protein: 7.6 g/dL (ref 6.5–8.1)

## 2017-11-13 LAB — IRON AND TIBC
Iron: 61 ug/dL (ref 41–142)
Saturation Ratios: 15 % — ABNORMAL LOW (ref 21–57)
TIBC: 395 ug/dL (ref 236–444)
UIBC: 334 ug/dL

## 2017-11-13 LAB — FERRITIN: Ferritin: 30 ng/mL (ref 11–307)

## 2017-11-13 LAB — TOTAL PROTEIN, URINE DIPSTICK: Protein, ur: NEGATIVE mg/dL

## 2017-11-13 MED ORDER — HYDROCODONE-ACETAMINOPHEN 5-325 MG PO TABS: ORAL_TABLET | ORAL | 0 refills | Status: DC

## 2017-11-13 MED ORDER — SODIUM CHLORIDE 0.9% FLUSH
10.0000 mL | Freq: Once | INTRAVENOUS | Status: AC
  Administered 2017-11-13: 10 mL
  Filled 2017-11-13: qty 10

## 2017-11-13 MED ORDER — LIDOCAINE-PRILOCAINE 2.5-2.5 % EX CREA: TOPICAL_CREAM | CUTANEOUS | 9 refills | Status: AC

## 2017-11-13 MED ORDER — HEPARIN SOD (PORK) LOCK FLUSH 100 UNIT/ML IV SOLN
500.0000 [IU] | Freq: Once | INTRAVENOUS | Status: AC
  Administered 2017-11-13: 500 [IU]
  Filled 2017-11-13: qty 5

## 2017-11-13 NOTE — Telephone Encounter (Signed)
Gave avs and calendar patient said she wanted water base contrast

## 2017-11-14 ENCOUNTER — Telehealth: Payer: Self-pay | Admitting: Hematology and Oncology

## 2017-11-14 ENCOUNTER — Encounter: Payer: Self-pay | Admitting: Hematology and Oncology

## 2017-11-14 ENCOUNTER — Other Ambulatory Visit: Payer: Self-pay | Admitting: Hematology and Oncology

## 2017-11-14 ENCOUNTER — Telehealth: Payer: Self-pay

## 2017-11-14 LAB — CA 125: Cancer Antigen (CA) 125: 55.8 U/mL — ABNORMAL HIGH (ref 0.0–38.1)

## 2017-11-14 NOTE — Telephone Encounter (Signed)
-----   Message from [name], MD sent at 11/14/2017  7:23 AM EDT ----- Regarding: CA-125 and iron studies Let her know CA-125 level is a bit up but it has fluctuated in the past. Would not change management. Plan to do CT scan as discussed. Also, iron level is trending now. No need iron replacement now but will likely need it next visit

## 2017-11-14 NOTE — Progress Notes (Signed)
[name] OFFICE PROGRESS NOTE  Patient Care Team: Helane Rima, MD as PCP - General (Family Medicine)  ASSESSMENT & PLAN:  Primary peritoneal carcinomatosis [facility]) Her tumor marker has been stable and overall, she is not symptomatic Her treatment is placed on hold in anticipation for cataract surgery I plan to repeat imaging study next month and to resume treatment then   Iron deficiency anemia She has symptoms of restless leg but iron panel is near normal and she is not anemic I do not recommend intravenous iron replacement therapy I plan to recheck that again in her next visit   Essential hypertension Her blood pressure is intermittently elevated, likely due to excessive salt intake and side effects of treatment She is not symptomatic She does not have proteinuria Since we will be holding her Avastin, I recommend close observation only with dietary modification  Thrombocytopenia (HCC) The intermittent leukopenia 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 She is not symptomatic.  Recommend close observation only There is no contraindication for her to proceed with cataract surgery and she does not need platelet transfusion.-  Low back pain The patient have chronic joint pain and arthritis He is prescribed gabapentin along with hydrocodone as needed We will continue the same and I have refilled her prescriptions   Orders Placed This Encounter  Procedures  . CT ABDOMEN PELVIS W CONTRAST    Standing Status:   Future    Standing Expiration Date:   11/14/2018    Order Specific Question:   If indicated for the ordered procedure, I authorize the administration of contrast media per Radiology protocol    Answer:   Yes    Order Specific Question:   Preferred imaging location?    Answer:   [facility]    Order Specific Question:   Radiology Contrast Protocol - do NOT remove file path    Answer:    [email or web address]    INTERVAL HISTORY: Please see below for problem oriented charting. She returns for further follow-up Her treatment is placed on hold due to anticipated cataract surgery.  If her surgery is scheduled for November 20, 2017 and a second surgery around December 04, 2017 She feels well Denies abdominal bloating, constipation or changes in bowel habits Her appetite is stable She has stable chronic lower back pain and occasional restless leg syndrome The patient denies any recent signs or symptoms of bleeding such as spontaneous epistaxis, hematuria or hematochezia.  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  No worrisome liver mass.  There are associated portal venous collaterals and splenomegaly consistent with portal venous hypertension. 2.  No other significant upper abdominal findings    11/08/2013 Imaging    US showed ascites    11/08/2013 Initial Diagnosis    Patient had ~ 2 months of some urinary incontinence, saw Dr Ronita Hipps in 10-2013, with pelvic mass on exam     11/12/2013 Imaging    There are findings of progressive hepatic cirrhosis with a large amount of ascites and mild splenomegaly. There are venous collaterals present. 2. There is abnormal thickening of the peritoneal surface along the left mid and lower abdominal wall. There is abnormal soft tissue density in the pelvis as well which could reflect the clinically suspected ovarian malignancy but the findings are nondiagnostic. 3.  There is a new small left pleural effusion. 4. There is no acute bowel abnormality.    11/18/2013 Imaging    Successful ultrasound guided diagnostic and therapeutic paracentesis yielding 4.1 liters of ascites.     11/18/2013 Pathology Results    PERITONEAL/ASCITIC FLUID (SPECIMEN  1 OF 1 COLLECTED 11/18/13): SEROUS CARCINOMA, PLEASE SEE COMMENT.    12/01/2013 Tumor Marker    Patient's tumor was tested for the following markers: CA125 Results of the tumor marker test revealed 1938    12/03/2013 Imaging    Successful ultrasound-guided therapeutic paracentesis yielding 3.6 liters of peritoneal fluid.    12/07/2013 - 05/30/2014 Chemotherapy    She received 3 cycles of carboplatin and Taxol, interrupted cycle 4 for surgery.  She subsequently completed 3 more cycles of chemotherapy after surgery    12/20/2013 Imaging    Successful ultrasound-guided diagnostic and therapeutic paracentesis yielding 2.2 liters of peritoneal fluid    12/20/2013 Pathology Results    PERITONEAL/ASCITIC FLUID(SPECIMEN 1 OF 1 COLLECTED 12/20/13): MALIGNANT CELLS CONSISTENT WITH SEROUS CARCINOMA    01/13/2014 Tumor Marker    Patient's tumor was tested for the following markers: CA125 Results of the tumor marker test revealed 227    02/07/2014 Tumor Marker    Patient's tumor was tested for the following markers: CA125 Results of the tumor marker test revealed 130    02/15/2014 Genetic Testing    Genetics testing from 02-2014 normal ([employer] panel)    02/23/2014 Imaging    Interval decrease in ascites. 2. Morphologic changes in the liver consistent with cirrhosis. 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 Portal vein remains patent at this time. 3. Persistent but decreased abnormal soft tissue attenuation tracking in the omental fat. This may be secondary to interval improvement in metastatic disease. 4. Peritoneal thickening seen in the left abdomen and pelvis on the previous study has decreased and nearly resolved in the interval. This also suggests interval improvement in metastatic disease.     03/07/2014 Procedure    Ultrasound and fluoroscopically guided right internal jugular single lumen power port catheter insertion. Tip in the SVC/RA junction. Catheter  ready for use.    03/17/2014 Tumor Marker    Patient's tumor was tested for the following markers: CA125 Results of the tumor marker test revealed 45    03/29/2014 Pathology Results    1. Omentum, resection for tumor - HIGH GRADE SEROUS CARCINOMA, SEE COMMENT. 2. Ovary and fallopian tube, right - HIGH GRADE SEROUS CARCINOMA, SEE COMMENT. 3. Ovary and fallopian tube, left - HIGH GRADE SEROUS CARCINOMA, SEE COMMENT. Diagnosis Note 1. Nests and clusters of malignant cells are invading the omental tissue (part #1) with associated fibrosis. The cells are pleomorphic with prominent nucleoli. There are scattered psammoma bodies. The ovaries are atrophic and exhibit multiple foci of surface based invasive carcinoma and associated fibrosis. The fallopian tubes have a few foci of carcinoma, also superficially located. There are no precursor lesions noted in the ovaries or fallopian tubes (the entire tubes and ovaries were submitted for evaluation). While there is some retraction artifact, there are several foci suspicious for lymphovascular invasion. 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 Given the fibrosis, there does appear to be a small amount of treatment effect, however, there is abundant residual tumor.     03/29/2014 Surgery    Procedure(s) Performed: 1. Exploratory laparotomy with bilateral  salpingo-oophorectomy, omentectomy radical tumor debulking for ovarian cancer .  Surgeon: Thereasa Solo, MD.  Assistant Surgeon: Lahoma Crocker, M.D. Assistant: (an MD assistant was necessary for tissue manipulation, retraction and positioning due to the complexity of the case and hospital policies).  Operative Findings: 10cm omental cake from hepatic to splenic flexure, densely adherent to transverse colon. Milliary studding of tumor implants (<53m, too numerous in  number to count) adherent to the mesentery of the small bowel and small bowel wall. Small volume (100cc) ascites. Small ovaries bilaterally, densely adherent to the pelvic cul de sac. Left ovary and tube densely adherent to sigmoid colon. Cirrhotic liver with hepatomegaly and splenomegaly.     This represented an optimal cytoreduction (R1) with visible disease residual on the bowel wall and mesentery (millial, <371mimplants).      04/18/2014 Tumor Marker    Patient's tumor was tested for the following markers: CA125 Results of the tumor marker test revealed 122    05/16/2014 Tumor Marker    Patient's tumor was tested for the following markers: CA125 Results of the tumor marker test revealed 30    06/10/2014 Tumor Marker    Patient's tumor was tested for the following markers: CA125 Results of the tumor marker test revealed 19    07/04/2014 Imaging    Interval improvement in the appearance of peritoneal metastasis secondary to ovarian cancer. 2. 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     08/18/2014 Tumor Marker    Patient's tumor was tested for the following markers: CA125 Results of the tumor marker test revealed 16    10/13/2014 Tumor Marker    Patient's tumor was tested for the following markers: CA125 Results of the tumor marker test revealed 14    11/21/2014 Tumor Marker    Patient's tumor was tested for the following markers: CA125 Results of the tumor marker test revealed 16    12/28/2014 Tumor Marker    Patient's tumor was tested for the following markers: CA125 Results of the tumor marker test revealed 762    02/27/2015 Tumor Marker    Patient's tumor was tested for the following markers: CA125 Results of the tumor marker test revealed 20.2    03/22/2015 Imaging    Filler appearance to the prior exam, with very subtle fluid tracking along portions of the liver, and very minimal nodularity along the right paracolic gutter representing residua from the  prior peritoneal cancer. The current abnormalities could simply be post therapy findings rather than necessarily representing residual malignancy. No new or enlarging lesions are identified. 2. Hepatic cirrhosis and splenomegaly. There is some gastric varices suggesting portal venous hypertension. 3. Left foraminal stenosis at L4-5 due to spurring. There is likely also some central narrowing of the thecal sac at this level. 4. Bibasilar scarring. 5. Chronic mild left mid kidney scarring    03/27/2015 Tumor Marker    Patient's tumor was tested for the following markers: CA125 Results of the tumor marker test revealed 25.3    04/24/2015 Imaging    Disc bulge L2-3 with mild spinal stenosis and right foraminal encroachment. 2. Disc bulge L3-4 with mild spinal stenosis. 3. Multifactorial spinal, left lateral recess and foraminal stenosis L4-5. There is grade 1 anterolisthesis without evident dynamic instability.    05/29/2015 Tumor Marker    Patient's tumor was tested for the following markers: CA125 Results of the tumor marker test revealed 29.9    08/21/2015 Tumor Marker    Patient's tumor was tested for  the following markers: CA125 Results of the tumor marker test revealed 45    09/18/2015 Tumor Marker    Patient's tumor was tested for the following markers: CA125 Results of the tumor marker test revealed 47    09/27/2015 Imaging    New small bowel mesenteric nodule and enlarged left external iliac lymph node, highly worrisome for metastatic disease. 2. Cirrhosis and splenomegaly    10/10/2015 Tumor Marker    Patient's tumor was tested for the following markers: CA125 Results of the tumor marker test revealed 53.4    10/10/2015 - 12/11/2015 Chemotherapy    She received 4 cycles of carboplatin only    11/20/2015 Tumor Marker    Patient's tumor was tested for the following markers: CA125 Results of the tumor marker test revealed 41.3    12/20/2015 Imaging    CT: Mild left external iliac  lymphadenopathy is slightly decreased. Mildly enlarged lower mesenteric nodule is slightly decreased. 2. No new or progressive metastatic disease in the abdomen or pelvis. 3. Cirrhosis. Stable subcentimeter hypodense left liver lobe lesion. No new liver lesions. 4. Stable mild splenomegaly.  No ascites. 5. Mild sigmoid diverticulosis.    01/01/2016 -  Anti-estrogen oral therapy    She was placed on tamoxifen. Had letrozole initially but switched to tamoxifen due to poor tolerance    01/15/2016 Tumor Marker    Patient's tumor was tested for the following markers: CA125 Results of the tumor marker test revealed 31.4    02/19/2016 Tumor Marker    Patient's tumor was tested for the following markers: CA125 Results of the tumor marker test revealed 36.5    05/13/2016 Imaging    No evidence of disease progression within the abdomen or pelvis. Previously noted small central mesenteric nodule and left external iliac node appear slightly smaller. 2. Stable changes of hepatic cirrhosis and portal hypertension with associated splenomegaly. No new or enlarging hepatic lesions are identified. 3. No acute findings.    05/13/2016 Tumor Marker    Patient's tumor was tested for the following markers: CA125 Results of the tumor marker test revealed 41.2    06/24/2016 Tumor Marker    Patient's tumor was tested for the following markers: CA125 Results of the tumor marker test revealed 47.3    08/20/2016 Imaging    1. The left external iliac lymph node is slightly increased in size compared to the prior exam, currently 1.1 cm and previously 0.9 cm in diameter. 2. Stable central mesenteric lymph node at 0.9 cm in diameter. 3. Stable trace free pelvic fluid and stable slight thickening along the right paracolic gutter, without well-defined peritoneal nodularity. 4. Other imaging findings of potential clinical significance: Subsegmental atelectasis or scarring in the lung bases. Hepatic cirrhosis. Left renal scarring.  Mild splenomegaly. Sigmoid colon diverticulosis. Impingement at L4-5 due to spondylosis and degenerative disc disease broad Schmorl' s nodes at L3-L4. Pelvic floor laxity    08/23/2016 Tumor Marker    Patient's tumor was tested for the following markers: CA125 Results of the tumor marker test revealed 80.2    09/04/2016 Imaging    LV EF: 60% -   65%    09/12/2016 Tumor Marker    Patient's tumor was tested for the following markers: CA125 Results of the tumor marker test revealed 98.5    09/12/2016 -  Chemotherapy    The patient received Doxil and Avastin.     09/26/2016 Tumor Marker    Patient's tumor was tested for the following markers: CA125 Results of  the tumor marker test revealed 68.9    10/10/2016 Tumor Marker    Patient's tumor was tested for the following markers: CA125 Results of the tumor marker test revealed 65.6    11/07/2016 Tumor Marker    Patient's tumor was tested for the following markers: CA125 Results of the tumor marker test revealed 46.4    11/29/2016 Imaging    Stable mild peritoneal thickening, suspicious for peritoneal carcinomatosis. No new or progressive disease identified. No evidence of ascites.  No significant change in 10 mm left external iliac and 8 mm mesenteric lymph nodes.  Stable hepatic cirrhosis, and splenomegaly consistent with portal venous hypertension.  Colonic diverticulosis, without radiographic evidence of diverticulitis.    12/02/2016 Imaging    Normal LV size with EF 55%. Basal inferior and basal inferoseptal hypokinesis. Normal RV size and systolic function. No significant valvular abnormalities.    12/05/2016 Tumor Marker    Patient's tumor was tested for the following markers: CA125 Results of the tumor marker test revealed 49.1    01/09/2017 Tumor Marker    Patient's tumor was tested for the following markers: CA125 Results of the tumor marker test revealed 47.2    02/06/2017 Tumor Marker    Patient's tumor was tested for the  following markers: CA125 Results of the tumor marker test revealed 44.1    02/18/2017 Imaging    Stable mild peritoneal thickening. No new or progressive disease identified within the abdomen or pelvis.  Stable tiny sub-cm left external iliac and mesenteric lymph nodes.  Stable hepatic cirrhosis, and splenomegaly consistent with portal venous hypertension. No evidence of hepatic neoplasm.  Colonic diverticulosis, without radiographic evidence of diverticulitis.    03/06/2017 Tumor Marker    Patient's tumor was tested for the following markers: CA125 Results of the tumor marker test revealed 50.4    03/17/2017 Imaging    - Left ventricle: The cavity size was normal. Wall thickness was normal. Systolic function was normal. The estimated ejection fraction was in the range of 55% to 60%. Wall motion was normal; there were no regional wall motion abnormalities. Features are consistent with a pseudonormal left ventricular filling pattern, with concomitant abnormal relaxation and increased filling pressure (grade 2 diastolic dysfunction). - Mitral valve: There was mild regurgitation. - Left atrium: The atrium was mildly dilated    04/03/2017 Tumor Marker    Patient's tumor was tested for the following markers: CA125 Results of the tumor marker test revealed 47.2    05/14/2017 Imaging    CT scan of abdomen and pelvis Stable mild peritoneal thickening. No new or progressive disease within the abdomen or pelvis.  Stable hepatic cirrhosis. Stable splenomegaly, consistent with portal venous hypertension. No evidence of hepatic neoplasm.    05/15/2017 Tumor Marker    Patient's tumor was tested for the following markers: CA125 Results of the tumor marker test revealed 56.1    06/12/2017 Tumor Marker    Patient's tumor was tested for the following markers: CA125 Results of the tumor marker test revealed 49.2    07/10/2017 Tumor Marker    Patient's tumor was tested for the following markers:  CA125 Results of the tumor marker test revealed 46.3    08/07/2017 Tumor Marker    Patient's tumor was tested for the following markers: CA125 Results of the tumor marker test revealed 52.9    08/20/2017 Imaging    1. Mild omental/peritoneal haziness, unchanged. No evidence of new metastatic disease. 2. Cirrhosis with splenomegaly.    09/04/2017 Tumor Marker  Patient's tumor was tested for the following markers: CA125 Results of the tumor marker test revealed 48.5    11/13/2017 Tumor Marker    Patient's tumor was tested for the following markers: CA125 Results of the tumor marker test revealed 55.8     REVIEW OF SYSTEMS:   Constitutional: Denies fevers, chills or abnormal weight loss Ears, nose, mouth, throat, and face: Denies mucositis or sore throat Respiratory: Denies cough, dyspnea or wheezes Cardiovascular: Denies palpitation, chest discomfort or lower extremity swelling Gastrointestinal:  Denies nausea, heartburn or change in bowel habits Skin: Denies abnormal skin rashes Lymphatics: Denies new lymphadenopathy or easy bruising Neurological:Denies numbness, tingling or new weaknesses Behavioral/Psych: Mood is stable, no new changes  All other systems were reviewed with the patient and are negative.  I have reviewed the past medical history, past surgical history, social history and family history with the patient and they are unchanged from previous note.  ALLERGIES:  is allergic to shellfish allergy; benadryl [diphenhydramine hcl]; penicillins; and tape.  MEDICATIONS:  Current Outpatient Medications  Medication Sig Dispense Refill  . bisoprolol (ZEBETA) 10 MG tablet Take 1 tablet (10 mg total) by mouth daily. 30 tablet 11  . cholecalciferol (VITAMIN D) 1000 units tablet Take 1,000 Units by mouth daily.    Marland Kitchen gabapentin (NEURONTIN) 300 MG capsule Take 2 capsules (600 mg total) by mouth 2 (two) times daily. 120 capsule 11  . hydrochlorothiazide (MICROZIDE) 12.5 MG capsule  Take 12.5 mg by mouth daily.  1  . HYDROcodone-acetaminophen (NORCO/VICODIN) 5-325 MG tablet take 1 tablet by mouth every 6 hours if needed for MODERATE PAIN 60 tablet 0  . lidocaine-prilocaine (EMLA) cream Apply to Porta-cath  1-2 hours prior to access as directed. 30 g 9  . metFORMIN (GLUCOPHAGE-XR) 500 MG 24 hr tablet Take 1,000 mg by mouth 2 (two) times daily.   0  . ondansetron (ZOFRAN) 8 MG tablet Take 1 tablet (8 mg total) by mouth every 8 (eight) hours as needed for nausea. 30 tablet 3  . pantoprazole (PROTONIX) 40 MG tablet Take 40 mg by mouth daily.   0  . promethazine (PHENERGAN) 25 MG tablet Take 1 tablet (25 mg total) by mouth every 6 (six) hours as needed for nausea. 30 tablet 3  . rotigotine (NEUPRO) 4 MG/24HR Place 1 patch onto the skin daily. 30 patch 6   No current facility-administered medications for this visit.     PHYSICAL EXAMINATION: ECOG PERFORMANCE STATUS: 1 - Symptomatic but completely ambulatory GENERAL:alert, no distress and comfortable SKIN: skin color, texture, turgor are normal, no rashes or significant lesions EYES: normal, Conjunctiva are pink and non-injected, sclera clear OROPHARYNX:no exudate, no erythema and lips, buccal mucosa, and tongue normal  NECK: supple, thyroid normal size, non-tender, without nodularity LYMPH:  no palpable lymphadenopathy in the cervical, axillary or inguinal LUNGS: clear to auscultation and percussion with normal breathing effort HEART: regular rate & rhythm and no murmurs and no lower extremity edema ABDOMEN:abdomen soft, non-tender and normal bowel sounds Musculoskeletal:no cyanosis of digits and no clubbing  NEURO: alert & oriented x 3 with fluent speech, no focal motor/sensory deficits  LABORATORY DATA:  I have reviewed the data as listed    Component Value Date/Time   NA 143 11/13/2017 1347   NA 142 02/06/2017 0742   K 4.1 11/13/2017 1347   K 3.9 02/06/2017 0742   CL 107 11/13/2017 1347   CO2 26 11/13/2017 1347    CO2 22 02/06/2017 0742   GLUCOSE 201 (  H) 11/13/2017 1347   GLUCOSE 156 (H) 02/06/2017 0742   BUN 15 11/13/2017 1347   BUN 13.5 02/06/2017 0742   CREATININE 0.84 11/13/2017 1347   CREATININE 0.8 02/06/2017 0742   CALCIUM 8.5 (L) 11/13/2017 1347   CALCIUM 8.5 02/06/2017 0742   PROT 7.6 11/13/2017 1347   PROT 6.9 02/06/2017 0742   ALBUMIN 3.8 11/13/2017 1347   ALBUMIN 3.7 02/06/2017 0742   AST 36 11/13/2017 1347   AST 47 (H) 02/06/2017 0742   ALT 36 11/13/2017 1347   ALT 54 02/06/2017 0742   ALKPHOS 118 11/13/2017 1347   ALKPHOS 130 02/06/2017 0742   BILITOT 0.9 11/13/2017 1347   BILITOT 0.65 02/06/2017 0742   GFRNONAA >60 11/13/2017 1347   GFRAA >60 11/13/2017 1347    No results found for: SPEP, UPEP  Lab Results  Component Value Date   WBC 4.1 11/13/2017   NEUTROABS 2.7 11/13/2017   HGB 12.5 11/13/2017   HCT 38.1 11/13/2017   MCV 91.0 11/13/2017   PLT 80 (L) 11/13/2017      Chemistry      Component Value Date/Time   NA 143 11/13/2017 1347   NA 142 02/06/2017 0742   K 4.1 11/13/2017 1347   K 3.9 02/06/2017 0742   CL 107 11/13/2017 1347   CO2 26 11/13/2017 1347   CO2 22 02/06/2017 0742   BUN 15 11/13/2017 1347   BUN 13.5 02/06/2017 0742   CREATININE 0.84 11/13/2017 1347   CREATININE 0.8 02/06/2017 0742      Component Value Date/Time   CALCIUM 8.5 (L) 11/13/2017 1347   CALCIUM 8.5 02/06/2017 0742   ALKPHOS 118 11/13/2017 1347   ALKPHOS 130 02/06/2017 0742   AST 36 11/13/2017 1347   AST 47 (H) 02/06/2017 0742   ALT 36 11/13/2017 1347   ALT 54 02/06/2017 0742   BILITOT 0.9 11/13/2017 1347   BILITOT 0.65 02/06/2017 0742      All questions were answered. The patient knows to call the clinic with any problems, questions or concerns. No barriers to learning was detected.  I spent 20 minutes counseling the patient face to face. The total time spent in the appointment was 25 minutes and more than 50% was on counseling and review of test results  [name], MD 11/14/2017 7:52 AM

## 2017-11-14 NOTE — Assessment & Plan Note (Signed)
Her blood pressure is intermittently elevated, likely due to excessive salt intake and side effects of treatment She is not symptomatic She does not have proteinuria Since we will be holding her Avastin, I recommend close observation only with dietary modification

## 2017-11-14 NOTE — Assessment & Plan Note (Signed)
Her tumor marker has been stable and overall, she is not symptomatic Her treatment is placed on hold in anticipation for cataract surgery I plan to repeat imaging study next month and to resume treatment then

## 2017-11-14 NOTE — Assessment & Plan Note (Addendum)
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 She is not symptomatic.  Recommend close observation only There is no contraindication for her to proceed with cataract surgery and she does not need platelet transfusion.-

## 2017-11-14 NOTE — Assessment & Plan Note (Signed)
She has symptoms of restless leg but iron panel is near normal and she is not anemic I do not recommend intravenous iron replacement therapy I plan to recheck that again in her next visit

## 2017-11-14 NOTE — Telephone Encounter (Signed)
Called and given below message. She verbalized understanding. 

## 2017-11-14 NOTE — Assessment & Plan Note (Signed)
The patient have chronic joint pain and arthritis He is prescribed gabapentin along with hydrocodone as needed We will continue the same and I have refilled her prescriptions

## 2017-11-14 NOTE — Telephone Encounter (Signed)
Scheduled appt per 10/4  sch message - pt is aware of appt date and time

## 2017-11-17 ENCOUNTER — Inpatient Hospital Stay: Payer: Medicare Other

## 2017-11-17 VITALS — BP 141/73 | Temp 98.2°F

## 2017-11-17 DIAGNOSIS — C482 Malignant neoplasm of peritoneum, unspecified: Secondary | ICD-10-CM | POA: Diagnosis not present

## 2017-11-17 DIAGNOSIS — C9000 Multiple myeloma not having achieved remission: Secondary | ICD-10-CM

## 2017-11-17 DIAGNOSIS — Z23 Encounter for immunization: Secondary | ICD-10-CM

## 2017-11-17 MED ORDER — INFLUENZA VAC SPLIT QUAD 0.5 ML IM SUSY
PREFILLED_SYRINGE | INTRAMUSCULAR | Status: AC
  Filled 2017-11-17: qty 0.5

## 2017-11-17 MED ORDER — INFLUENZA VAC SPLIT QUAD 0.5 ML IM SUSY
0.5000 mL | PREFILLED_SYRINGE | Freq: Once | INTRAMUSCULAR | Status: AC
  Administered 2017-11-17: 0.5 mL via INTRAMUSCULAR

## 2017-12-01 ENCOUNTER — Encounter: Payer: Self-pay | Admitting: Hematology and Oncology

## 2017-12-01 ENCOUNTER — Telehealth: Payer: Self-pay

## 2017-12-01 NOTE — Telephone Encounter (Signed)
Called per request from Ms. Valladares. Faxed letter to [phone] for clearance for dental work.

## 2017-12-11 ENCOUNTER — Telehealth: Payer: Self-pay | Admitting: Hematology and Oncology

## 2017-12-11 NOTE — Telephone Encounter (Signed)
NG out 10/30 thru 11/29 - moved 11/7 f/u to Dr. Audelia Hives. Spoke with patient.

## 2017-12-17 ENCOUNTER — Ambulatory Visit (HOSPITAL_COMMUNITY)
Admission: RE | Admit: 2017-12-17 | Discharge: 2017-12-17 | Disposition: A | Discharge status: Home / Self Care | Payer: Medicare Other | Source: Ambulatory Visit | Attending: Hematology and Oncology | Admitting: Hematology and Oncology

## 2017-12-17 ENCOUNTER — Inpatient Hospital Stay: Payer: Medicare Other | Attending: Hematology and Oncology

## 2017-12-17 ENCOUNTER — Inpatient Hospital Stay: Payer: Medicare Other

## 2017-12-17 DIAGNOSIS — R161 Splenomegaly, not elsewhere classified: Secondary | ICD-10-CM | POA: Insufficient documentation

## 2017-12-17 DIAGNOSIS — C482 Malignant neoplasm of peritoneum, unspecified: Secondary | ICD-10-CM

## 2017-12-17 DIAGNOSIS — G2581 Restless legs syndrome: Secondary | ICD-10-CM | POA: Insufficient documentation

## 2017-12-17 DIAGNOSIS — I864 Gastric varices: Secondary | ICD-10-CM | POA: Insufficient documentation

## 2017-12-17 DIAGNOSIS — K766 Portal hypertension: Secondary | ICD-10-CM | POA: Insufficient documentation

## 2017-12-17 DIAGNOSIS — Z7984 Long term (current) use of oral hypoglycemic drugs: Secondary | ICD-10-CM | POA: Diagnosis not present

## 2017-12-17 DIAGNOSIS — G8929 Other chronic pain: Secondary | ICD-10-CM | POA: Diagnosis not present

## 2017-12-17 DIAGNOSIS — Z79899 Other long term (current) drug therapy: Secondary | ICD-10-CM | POA: Insufficient documentation

## 2017-12-17 DIAGNOSIS — D509 Iron deficiency anemia, unspecified: Secondary | ICD-10-CM | POA: Insufficient documentation

## 2017-12-17 DIAGNOSIS — D696 Thrombocytopenia, unspecified: Secondary | ICD-10-CM | POA: Diagnosis not present

## 2017-12-17 DIAGNOSIS — K7460 Unspecified cirrhosis of liver: Secondary | ICD-10-CM | POA: Diagnosis not present

## 2017-12-17 DIAGNOSIS — I8500 Esophageal varices without bleeding: Secondary | ICD-10-CM | POA: Diagnosis not present

## 2017-12-17 DIAGNOSIS — K769 Liver disease, unspecified: Secondary | ICD-10-CM | POA: Diagnosis not present

## 2017-12-17 DIAGNOSIS — C569 Malignant neoplasm of unspecified ovary: Secondary | ICD-10-CM

## 2017-12-17 DIAGNOSIS — I10 Essential (primary) hypertension: Secondary | ICD-10-CM | POA: Diagnosis not present

## 2017-12-17 DIAGNOSIS — Z9221 Personal history of antineoplastic chemotherapy: Secondary | ICD-10-CM | POA: Insufficient documentation

## 2017-12-17 DIAGNOSIS — Z5112 Encounter for antineoplastic immunotherapy: Secondary | ICD-10-CM | POA: Insufficient documentation

## 2017-12-17 DIAGNOSIS — K5730 Diverticulosis of large intestine without perforation or abscess without bleeding: Secondary | ICD-10-CM | POA: Diagnosis not present

## 2017-12-17 DIAGNOSIS — M1990 Unspecified osteoarthritis, unspecified site: Secondary | ICD-10-CM | POA: Insufficient documentation

## 2017-12-17 DIAGNOSIS — M545 Low back pain: Secondary | ICD-10-CM | POA: Insufficient documentation

## 2017-12-17 LAB — CBC WITH DIFFERENTIAL/PLATELET
Abs Immature Granulocytes: 0.01 10*3/uL (ref 0.00–0.07)
Basophils Absolute: 0 10*3/uL (ref 0.0–0.1)
Basophils Relative: 1 %
Eosinophils Absolute: 0.1 10*3/uL (ref 0.0–0.5)
Eosinophils Relative: 2 %
HCT: 35.9 % — ABNORMAL LOW (ref 36.0–46.0)
Hemoglobin: 11.3 g/dL — ABNORMAL LOW (ref 12.0–15.0)
Immature Granulocytes: 0 %
Lymphocytes Relative: 31 %
Lymphs Abs: 1.3 10*3/uL (ref 0.7–4.0)
MCH: 29 pg (ref 26.0–34.0)
MCHC: 31.5 g/dL (ref 30.0–36.0)
MCV: 92.3 fL (ref 80.0–100.0)
Monocytes Absolute: 0.2 10*3/uL (ref 0.1–1.0)
Monocytes Relative: 5 %
Neutro Abs: 2.5 10*3/uL (ref 1.7–7.7)
Neutrophils Relative %: 61 %
Platelets: 94 10*3/uL — ABNORMAL LOW (ref 150–400)
RBC: 3.89 MIL/uL (ref 3.87–5.11)
RDW: 15.9 % — ABNORMAL HIGH (ref 11.5–15.5)
WBC: 4.2 10*3/uL (ref 4.0–10.5)
nRBC: 0 % (ref 0.0–0.2)

## 2017-12-17 LAB — COMPREHENSIVE METABOLIC PANEL
ALT: 37 U/L (ref 0–44)
AST: 44 U/L — ABNORMAL HIGH (ref 15–41)
Albumin: 3.8 g/dL (ref 3.5–5.0)
Alkaline Phosphatase: 85 U/L (ref 38–126)
Anion gap: 10 (ref 5–15)
BUN: 16 mg/dL (ref 8–23)
CO2: 24 mmol/L (ref 22–32)
Calcium: 9.2 mg/dL (ref 8.9–10.3)
Chloride: 110 mmol/L (ref 98–111)
Creatinine, Ser: 0.85 mg/dL (ref 0.44–1.00)
GFR calc Af Amer: >60 mL/min (ref >60)
GFR calc non Af Amer: >60 mL/min (ref >60)
Glucose, Bld: 99 mg/dL (ref 70–99)
Potassium: 4.3 mmol/L (ref 3.5–5.1)
Sodium: 144 mmol/L (ref 135–145)
Total Bilirubin: 0.7 mg/dL (ref 0.3–1.2)
Total Protein: 7.5 g/dL (ref 6.5–8.1)

## 2017-12-17 LAB — IRON AND TIBC
Iron: 56 ug/dL (ref 41–142)
Saturation Ratios: 13 % — ABNORMAL LOW (ref 21–57)
TIBC: 421 ug/dL (ref 236–444)
UIBC: 365 ug/dL (ref 120–384)

## 2017-12-17 LAB — TOTAL PROTEIN, URINE DIPSTICK: Protein, ur: NEGATIVE mg/dL

## 2017-12-17 LAB — FERRITIN: Ferritin: 20 ng/mL (ref 11–307)

## 2017-12-17 MED ORDER — IOHEXOL 300 MG/ML  SOLN
100.0000 mL | Freq: Once | INTRAMUSCULAR | Status: AC | PRN
  Administered 2017-12-17: 100 mL via INTRAVENOUS

## 2017-12-17 MED ORDER — SODIUM CHLORIDE 0.9% FLUSH
10.0000 mL | Freq: Once | INTRAVENOUS | Status: AC
  Administered 2017-12-17: 10 mL
  Filled 2017-12-17: qty 10

## 2017-12-17 MED ORDER — IOHEXOL 300 MG/ML  SOLN
30.0000 mL | Freq: Once | INTRAMUSCULAR | Status: AC | PRN
  Administered 2017-12-17: 30 mL via ORAL

## 2017-12-17 MED ORDER — HEPARIN SOD (PORK) LOCK FLUSH 100 UNIT/ML IV SOLN
INTRAVENOUS | Status: AC
  Administered 2017-12-17: 500 [IU] via INTRAVENOUS
  Filled 2017-12-17: qty 5

## 2017-12-17 MED ORDER — HEPARIN SOD (PORK) LOCK FLUSH 100 UNIT/ML IV SOLN
500.0000 [IU] | Freq: Once | INTRAVENOUS | Status: AC
  Administered 2017-12-17: 500 [IU] via INTRAVENOUS

## 2017-12-17 MED ORDER — SODIUM CHLORIDE (PF) 0.9 % IJ SOLN
INTRAMUSCULAR | Status: AC
  Filled 2017-12-17: qty 50

## 2017-12-18 ENCOUNTER — Inpatient Hospital Stay: Payer: Medicare Other

## 2017-12-18 ENCOUNTER — Encounter: Payer: Self-pay | Admitting: Hematology and Oncology

## 2017-12-18 ENCOUNTER — Inpatient Hospital Stay (HOSPITAL_BASED_OUTPATIENT_CLINIC_OR_DEPARTMENT_OTHER): Payer: Medicare Other | Admitting: Hematology and Oncology

## 2017-12-18 VITALS — BP 130/62 | HR 63 | Temp 98.1°F | Resp 19 | Ht 61.5 in | Wt 187.0 lb

## 2017-12-18 DIAGNOSIS — D509 Iron deficiency anemia, unspecified: Secondary | ICD-10-CM

## 2017-12-18 DIAGNOSIS — Z5112 Encounter for antineoplastic immunotherapy: Secondary | ICD-10-CM | POA: Diagnosis not present

## 2017-12-18 DIAGNOSIS — Z79899 Other long term (current) drug therapy: Secondary | ICD-10-CM

## 2017-12-18 DIAGNOSIS — G8929 Other chronic pain: Secondary | ICD-10-CM

## 2017-12-18 DIAGNOSIS — M545 Low back pain: Secondary | ICD-10-CM

## 2017-12-18 DIAGNOSIS — R161 Splenomegaly, not elsewhere classified: Secondary | ICD-10-CM

## 2017-12-18 DIAGNOSIS — C482 Malignant neoplasm of peritoneum, unspecified: Secondary | ICD-10-CM

## 2017-12-18 DIAGNOSIS — K769 Liver disease, unspecified: Secondary | ICD-10-CM

## 2017-12-18 DIAGNOSIS — M1990 Unspecified osteoarthritis, unspecified site: Secondary | ICD-10-CM

## 2017-12-18 DIAGNOSIS — Z7984 Long term (current) use of oral hypoglycemic drugs: Secondary | ICD-10-CM

## 2017-12-18 DIAGNOSIS — Z9221 Personal history of antineoplastic chemotherapy: Secondary | ICD-10-CM

## 2017-12-18 DIAGNOSIS — D696 Thrombocytopenia, unspecified: Secondary | ICD-10-CM

## 2017-12-18 DIAGNOSIS — I10 Essential (primary) hypertension: Secondary | ICD-10-CM

## 2017-12-18 DIAGNOSIS — C569 Malignant neoplasm of unspecified ovary: Secondary | ICD-10-CM

## 2017-12-18 DIAGNOSIS — G2581 Restless legs syndrome: Secondary | ICD-10-CM

## 2017-12-18 LAB — CA 125: Cancer Antigen (CA) 125: 58.1 U/mL — ABNORMAL HIGH (ref 0.0–38.1)

## 2017-12-18 MED ORDER — SODIUM CHLORIDE 0.9 % IV SOLN
Freq: Once | INTRAVENOUS | Status: AC
  Administered 2017-12-18: 09:00:00 via INTRAVENOUS
  Filled 2017-12-18: qty 250

## 2017-12-18 MED ORDER — SODIUM CHLORIDE 0.9% FLUSH
10.0000 mL | INTRAVENOUS | Status: DC | PRN
  Administered 2017-12-18: 10 mL
  Filled 2017-12-18: qty 10

## 2017-12-18 MED ORDER — HEPARIN SOD (PORK) LOCK FLUSH 100 UNIT/ML IV SOLN
500.0000 [IU] | Freq: Once | INTRAVENOUS | Status: AC | PRN
  Administered 2017-12-18: 500 [IU]
  Filled 2017-12-18: qty 5

## 2017-12-18 MED ORDER — SODIUM CHLORIDE 0.9 % IV SOLN
10.2000 mg/kg | Freq: Once | INTRAVENOUS | Status: AC
  Administered 2017-12-18: 900 mg via INTRAVENOUS
  Filled 2017-12-18: qty 32

## 2017-12-18 MED ORDER — SODIUM CHLORIDE 0.9 % IV SOLN
510.0000 mg | Freq: Once | INTRAVENOUS | Status: AC
  Administered 2017-12-18: 510 mg via INTRAVENOUS
  Filled 2017-12-18: qty 17

## 2017-12-18 MED ORDER — HYDROCODONE-ACETAMINOPHEN 5-325 MG PO TABS: ORAL_TABLET | ORAL | 0 refills | Status: DC

## 2017-12-18 NOTE — Patient Instructions (Addendum)
[facility] Discharge Instructions for Patients Receiving Chemotherapy  Today you received the following chemotherapy agents Bevacizumab (Avastin).  To help prevent nausea and vomiting after your treatment, we encourage you to take your nausea medication as prescribed.   If you develop nausea and vomiting that is not controlled by your nausea medication, call the clinic.   BELOW ARE SYMPTOMS THAT SHOULD BE REPORTED IMMEDIATELY:  *FEVER GREATER THAN 100.5 F  *CHILLS WITH OR WITHOUT FEVER  NAUSEA AND VOMITING THAT IS NOT CONTROLLED WITH YOUR NAUSEA MEDICATION  *UNUSUAL SHORTNESS OF BREATH  *UNUSUAL BRUISING OR BLEEDING  TENDERNESS IN MOUTH AND THROAT WITH OR WITHOUT PRESENCE OF ULCERS  *URINARY PROBLEMS  *BOWEL PROBLEMS  UNUSUAL RASH Items with * indicate a potential emergency and should be followed up as soon as possible.  Feel free to call the clinic should you have any questions or concerns. The clinic phone number is [phone].  Please show the [facility] at check-in to the Emergency Department and triage nurse.  Ferumoxytol injection What is this medicine? FERUMOXYTOL is an iron complex. Iron is used to make healthy red blood cells, which carry oxygen and nutrients throughout the body. This medicine is used to treat iron deficiency anemia in people with chronic kidney disease. This medicine may be used for other purposes; ask your health care provider or pharmacist if you have questions. COMMON BRAND NAME(S): Feraheme What should I tell my health care provider before I take this medicine? They need to know if you have any of these conditions: -anemia not caused by low iron levels -high levels of iron in the blood -magnetic resonance imaging (MRI) test scheduled -an unusual or allergic reaction to iron, other medicines, foods, dyes, or preservatives -pregnant or trying to get pregnant -breast-feeding How should I use this medicine? This  medicine is for injection into a vein. It is given by a health care professional in a hospital or clinic setting. Talk to your pediatrician regarding the use of this medicine in children. Special care may be needed. Overdosage: If you think you have taken too much of this medicine contact a poison control center or emergency room at once. NOTE: This medicine is only for you. Do not share this medicine with others. What if I miss a dose? It is important not to miss your dose. Call your doctor or health care professional if you are unable to keep an appointment. What may interact with this medicine? This medicine may interact with the following medications: -other iron products This list may not describe all possible interactions. Give your health care provider a list of all the medicines, herbs, non-prescription drugs, or dietary supplements you use. Also tell them if you smoke, drink alcohol, or use illegal drugs. Some items may interact with your medicine. What should I watch for while using this medicine? Visit your doctor or healthcare professional regularly. Tell your doctor or healthcare professional if your symptoms do not start to get better or if they get worse. You may need blood work done while you are taking this medicine. You may need to follow a special diet. Talk to your doctor. Foods that contain iron include: whole grains/cereals, dried fruits, beans, or peas, leafy green vegetables, and organ meats (liver, kidney). What side effects may I notice from receiving this medicine? Side effects that you should report to your doctor or health care professional as soon as possible: -allergic reactions like skin rash, itching or hives, swelling of the  face, lips, or tongue -breathing problems -changes in blood pressure -feeling faint or lightheaded, falls -fever or chills -flushing, sweating, or hot feelings -swelling of the ankles or feet Side effects that usually do not require medical  attention (report to your doctor or health care professional if they continue or are bothersome): -diarrhea -headache -nausea, vomiting -stomach pain This list may not describe all possible side effects. Call your doctor for medical advice about side effects. You may report side effects to FDA at 1-800-FDA-1088. Where should I keep my medicine? This drug is given in a hospital or clinic and will not be stored at home. NOTE: This sheet is a summary. It may not cover all possible information. If you have questions about this medicine, talk to your doctor, pharmacist, or health care provider.  2018 [employer] Standard (2015-03-02 12:41:49)

## 2017-12-18 NOTE — Patient Instructions (Addendum)
We discussed in detail the results of your blood work and recent CT imaging.  Both Avastin and infusional iron will be given today.  You are scheduled for a return visit on November 14 for lab work.  A prescription for Norco: 1 tablet every 6 hours as needed for pain was prescribed.  Barring any unforeseen complications, your next scheduled doctor visit with Avastin is on January 01, 2018.  Please do not hesitate to call should any new or untoward problems arise in the interim.  Thank you! Ladona Ridgel, MD Hematology/Oncology

## 2017-12-18 NOTE — Progress Notes (Signed)
Dr. Audelia Hives saw pt today prior to chemo.  OK to treat with Platelet counts  94 as per MD.

## 2017-12-18 NOTE — Progress Notes (Signed)
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 She is not symptomatic. Her treatment was placed on hold in anticipation for cataract surgery, now complete. CT imaging of the abdomen and pelvis from November 6 failed to reveal any evidence of progressive disease. She is scheduled for single agent bevacizumab today. A follow-up visit has been scheduled for November 14. Her next scheduled bevacizumab is on November 21.  Iron deficiency anemia She has symptoms of restless leg. Her serum ferritin today was 20. Parenteral iron will be administered today. Her last serum ferritin from October 3 was 45.  Her energy level is low but stable.  Essential hypertension Her blood pressure is intermittently elevated, likely due to bevacizumab. There is no proteinuria. She is not symptomatic. Dietary modification was encouraged.  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 She has no bleeding tendency.   Recommend close observation only Her cataract surgery went unremarkably She also had a single tooth extracted in the interim.  Low back pain She has chronic joint pain and arthritis Previously gabapentin was prescribed along with hydrocodone/APAP: 5/325 as needed We will continue the same and I have refilled her prescriptions today. Her pain is controlled.  INTERVAL HISTORY: Please see below for problem oriented charting. She returns for further follow-up in anticipation of restarting bevacizumab. Her treatment is placed on hold due to anticipated cataract surgery.   She feels well Denies abdominal bloating, constipation or changes in bowel habits Her  appetite is stable She has stable chronic lower back pain and occasional restless leg syndrome Olivia Benson denies spontaneous epistaxis, melena, bright red blood per rectum, hematuria or hematochezia.  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  No worrisome liver mass.  There are associated portal venous collaterals and splenomegaly consistent with portal venous hypertension. 2.  No other significant upper abdominal findings    11/08/2013 Imaging    US showed ascites    11/08/2013 Initial Diagnosis    Patient had ~ 2 months of some urinary incontinence, saw Dr Ronita Hipps in 10-2013, with pelvic mass on exam     11/12/2013 Imaging    There are findings of progressive hepatic cirrhosis with a large amount of ascites and mild splenomegaly. There are venous collaterals present. 2. There is abnormal thickening of the peritoneal surface along the left mid and lower abdominal wall. There is abnormal soft tissue density in the pelvis as well which could reflect the clinically suspected ovarian malignancy but the findings are nondiagnostic. 3. There is a new small left pleural effusion. 4. There is no acute bowel abnormality.    11/18/2013 Imaging    Successful ultrasound guided diagnostic and therapeutic paracentesis yielding 4.1 liters of ascites.     11/18/2013 Pathology Results    PERITONEAL/ASCITIC FLUID (SPECIMEN 1 OF 1 COLLECTED 11/18/13): SEROUS CARCINOMA, PLEASE SEE COMMENT.    12/01/2013 Tumor Marker    Patient's tumor was tested for the following markers: CA125 Results of the tumor marker test revealed 1938    12/03/2013 Imaging    Successful ultrasound-guided therapeutic paracentesis yielding 3.6 liters of  peritoneal fluid.    12/07/2013 - 05/30/2014 Chemotherapy    She received 3 cycles of carboplatin and Taxol,  interrupted cycle 4 for surgery.  She subsequently completed 3 more cycles of chemotherapy after surgery    12/20/2013 Imaging    Successful ultrasound-guided diagnostic and therapeutic paracentesis yielding 2.2 liters of peritoneal fluid    12/20/2013 Pathology Results    PERITONEAL/ASCITIC FLUID(SPECIMEN 1 OF 1 COLLECTED 12/20/13): MALIGNANT CELLS CONSISTENT WITH SEROUS CARCINOMA    01/13/2014 Tumor Marker    Patient's tumor was tested for the following markers: CA125 Results of the tumor marker test revealed 227    02/07/2014 Tumor Marker    Patient's tumor was tested for the following markers: CA125 Results of the tumor marker test revealed 130    02/15/2014 Genetic Testing    Genetics testing from 02-2014 normal ([employer] panel)    02/23/2014 Imaging    Interval decrease in ascites. 2. Morphologic changes in the liver consistent with cirrhosis. 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 Portal vein remains patent at this time. 3. Persistent but decreased abnormal soft tissue attenuation tracking in the omental fat. This may be secondary to interval improvement in metastatic disease. 4. Peritoneal thickening seen in the left abdomen and pelvis on the previous study has decreased and nearly resolved in the interval. This also suggests interval improvement in metastatic disease.     03/07/2014 Procedure    Ultrasound and fluoroscopically guided right internal jugular single lumen power port catheter insertion. Tip in the SVC/RA junction. Catheter ready for use.    03/17/2014 Tumor Marker    Patient's tumor was tested for the following markers: CA125 Results of the tumor marker test revealed 45    03/29/2014 Pathology Results    1. Omentum, resection for tumor - HIGH GRADE SEROUS CARCINOMA, SEE COMMENT. 2. Ovary and fallopian tube, right - HIGH GRADE SEROUS CARCINOMA, SEE COMMENT. 3. Ovary and fallopian tube, left - HIGH GRADE SEROUS CARCINOMA, SEE  COMMENT. Diagnosis Note 1. Nests and clusters of malignant cells are invading the omental tissue (part #1) with associated fibrosis. The cells are pleomorphic with prominent nucleoli. There are scattered psammoma bodies. The ovaries are atrophic and exhibit multiple foci of surface based invasive carcinoma and associated fibrosis. The fallopian tubes have a few foci of carcinoma, also superficially located. There are no precursor lesions noted in the ovaries or fallopian tubes (the entire tubes and ovaries were submitted for evaluation). While there is some retraction artifact, there are several foci suspicious for lymphovascular invasion. 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 Given the fibrosis, there does appear to be a small amount of treatment effect, however, there is abundant residual tumor.     03/29/2014 Surgery    Procedure(s) Performed: 1. Exploratory laparotomy with bilateral salpingo-oophorectomy, omentectomy radical tumor debulking for ovarian cancer .  Surgeon: Thereasa Solo, MD.  Assistant Surgeon: Lahoma Crocker, M.D. Assistant: (an MD assistant was necessary for tissue manipulation, retraction and positioning due to the complexity of the case and hospital policies).  Operative Findings: 10cm omental cake from hepatic to splenic flexure, densely adherent to transverse colon. Milliary studding of tumor implants (<60m, too numerous in number to count) adherent to the mesentery of the small bowel and small bowel wall. Small volume (100cc) ascites. Small ovaries bilaterally, densely adherent to the pelvic cul de sac. Left ovary  and tube densely adherent to sigmoid colon. Cirrhotic liver with hepatomegaly and splenomegaly.     This represented an optimal cytoreduction (R1) with visible disease residual on the bowel wall and mesentery (millial, <8m  implants).      04/18/2014 Tumor Marker    Patient's tumor was tested for the following markers: CA125 Results of the tumor marker test revealed 122    05/16/2014 Tumor Marker    Patient's tumor was tested for the following markers: CA125 Results of the tumor marker test revealed 30    06/10/2014 Tumor Marker    Patient's tumor was tested for the following markers: CA125 Results of the tumor marker test revealed 19    07/04/2014 Imaging    Interval improvement in the appearance of peritoneal metastasis secondary to ovarian cancer. 2. 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     08/18/2014 Tumor Marker    Patient's tumor was tested for the following markers: CA125 Results of the tumor marker test revealed 16    10/13/2014 Tumor Marker    Patient's tumor was tested for the following markers: CA125 Results of the tumor marker test revealed 14    11/21/2014 Tumor Marker    Patient's tumor was tested for the following markers: CA125 Results of the tumor marker test revealed 16    12/28/2014 Tumor Marker    Patient's tumor was tested for the following markers: CA125 Results of the tumor marker test revealed 762    02/27/2015 Tumor Marker    Patient's tumor was tested for the following markers: CA125 Results of the tumor marker test revealed 20.2    03/22/2015 Imaging    Filler appearance to the prior exam, with very subtle fluid tracking along portions of the liver, and very minimal nodularity along the right paracolic gutter representing residua from the prior peritoneal cancer. The current abnormalities could simply be post therapy findings rather than necessarily representing residual malignancy. No new or enlarging lesions are identified. 2. Hepatic cirrhosis and splenomegaly. There is some gastric varices suggesting portal venous hypertension. 3. Left foraminal stenosis at L4-5 due to spurring. There is likely also some central narrowing of the thecal sac at this  level. 4. Bibasilar scarring. 5. Chronic mild left mid kidney scarring    03/27/2015 Tumor Marker    Patient's tumor was tested for the following markers: CA125 Results of the tumor marker test revealed 25.3    04/24/2015 Imaging    Disc bulge L2-3 with mild spinal stenosis and right foraminal encroachment. 2. Disc bulge L3-4 with mild spinal stenosis. 3. Multifactorial spinal, left lateral recess and foraminal stenosis L4-5. There is grade 1 anterolisthesis without evident dynamic instability.    05/29/2015 Tumor Marker    Patient's tumor was tested for the following markers: CA125 Results of the tumor marker test revealed 29.9    08/21/2015 Tumor Marker    Patient's tumor was tested for the following markers: CA125 Results of the tumor marker test revealed 45    09/18/2015 Tumor Marker    Patient's tumor was tested for the following markers: CA125 Results of the tumor marker test revealed 47    09/27/2015 Imaging    New small bowel mesenteric nodule and enlarged left external iliac lymph node, highly worrisome for metastatic disease. 2. Cirrhosis and splenomegaly    10/10/2015 Tumor Marker    Patient's tumor was tested for the following markers: CA125 Results of the tumor marker test revealed 53.4    10/10/2015 - 12/11/2015 Chemotherapy  She received 4 cycles of carboplatin only    11/20/2015 Tumor Marker    Patient's tumor was tested for the following markers: CA125 Results of the tumor marker test revealed 41.3    12/20/2015 Imaging    CT: Mild left external iliac lymphadenopathy is slightly decreased. Mildly enlarged lower mesenteric nodule is slightly decreased. 2. No new or progressive metastatic disease in the abdomen or pelvis. 3. Cirrhosis. Stable subcentimeter hypodense left liver lobe lesion. No new liver lesions. 4. Stable mild splenomegaly.  No ascites. 5. Mild sigmoid diverticulosis.    01/01/2016 -  Anti-estrogen oral therapy    She was placed on tamoxifen. Had  letrozole initially but switched to tamoxifen due to poor tolerance    01/15/2016 Tumor Marker    Patient's tumor was tested for the following markers: CA125 Results of the tumor marker test revealed 31.4    02/19/2016 Tumor Marker    Patient's tumor was tested for the following markers: CA125 Results of the tumor marker test revealed 36.5    05/13/2016 Imaging    No evidence of disease progression within the abdomen or pelvis. Previously noted small central mesenteric nodule and left external iliac node appear slightly smaller. 2. Stable changes of hepatic cirrhosis and portal hypertension with associated splenomegaly. No new or enlarging hepatic lesions are identified. 3. No acute findings.    05/13/2016 Tumor Marker    Patient's tumor was tested for the following markers: CA125 Results of the tumor marker test revealed 41.2    06/24/2016 Tumor Marker    Patient's tumor was tested for the following markers: CA125 Results of the tumor marker test revealed 47.3    08/20/2016 Imaging    1. The left external iliac lymph node is slightly increased in size compared to the prior exam, currently 1.1 cm and previously 0.9 cm in diameter. 2. Stable central mesenteric lymph node at 0.9 cm in diameter. 3. Stable trace free pelvic fluid and stable slight thickening along the right paracolic gutter, without well-defined peritoneal nodularity. 4. Other imaging findings of potential clinical significance: Subsegmental atelectasis or scarring in the lung bases. Hepatic cirrhosis. Left renal scarring. Mild splenomegaly. Sigmoid colon diverticulosis. Impingement at L4-5 due to spondylosis and degenerative disc disease broad Schmorl' s nodes at L3-L4. Pelvic floor laxity    08/23/2016 Tumor Marker    Patient's tumor was tested for the following markers: CA125 Results of the tumor marker test revealed 80.2    09/04/2016 Imaging    LV EF: 60% -   65%    09/12/2016 Tumor Marker    Patient's tumor was tested for the  following markers: CA125 Results of the tumor marker test revealed 98.5    09/12/2016 -  Chemotherapy    The patient received Doxil and Avastin.     09/26/2016 Tumor Marker    Patient's tumor was tested for the following markers: CA125 Results of the tumor marker test revealed 68.9    10/10/2016 Tumor Marker    Patient's tumor was tested for the following markers: CA125 Results of the tumor marker test revealed 65.6    11/07/2016 Tumor Marker    Patient's tumor was tested for the following markers: CA125 Results of the tumor marker test revealed 46.4    11/29/2016 Imaging    Stable mild peritoneal thickening, suspicious for peritoneal carcinomatosis. No new or progressive disease identified. No evidence of ascites.  No significant change in 10 mm left external iliac and 8 mm mesenteric lymph nodes.  Stable hepatic  cirrhosis, and splenomegaly consistent with portal venous hypertension.  Colonic diverticulosis, without radiographic evidence of diverticulitis.    12/02/2016 Imaging    Normal LV size with EF 55%. Basal inferior and basal inferoseptal hypokinesis. Normal RV size and systolic function. No significant valvular abnormalities.    12/05/2016 Tumor Marker    Patient's tumor was tested for the following markers: CA125 Results of the tumor marker test revealed 49.1    01/09/2017 Tumor Marker    Patient's tumor was tested for the following markers: CA125 Results of the tumor marker test revealed 47.2    02/06/2017 Tumor Marker    Patient's tumor was tested for the following markers: CA125 Results of the tumor marker test revealed 44.1    02/18/2017 Imaging    Stable mild peritoneal thickening. No new or progressive disease identified within the abdomen or pelvis.  Stable tiny sub-cm left external iliac and mesenteric lymph nodes.  Stable hepatic cirrhosis, and splenomegaly consistent with portal venous hypertension. No evidence of hepatic neoplasm.  Colonic  diverticulosis, without radiographic evidence of diverticulitis.    03/06/2017 Tumor Marker    Patient's tumor was tested for the following markers: CA125 Results of the tumor marker test revealed 50.4    03/17/2017 Imaging    - Left ventricle: The cavity size was normal. Wall thickness was normal. Systolic function was normal. The estimated ejection fraction was in the range of 55% to 60%. Wall motion was normal; there were no regional wall motion abnormalities. Features are consistent with a pseudonormal left ventricular filling pattern, with concomitant abnormal relaxation and increased filling pressure (grade 2 diastolic dysfunction). - Mitral valve: There was mild regurgitation. - Left atrium: The atrium was mildly dilated    04/03/2017 Tumor Marker    Patient's tumor was tested for the following markers: CA125 Results of the tumor marker test revealed 47.2    05/14/2017 Imaging    CT scan of abdomen and pelvis Stable mild peritoneal thickening. No new or progressive disease within the abdomen or pelvis.  Stable hepatic cirrhosis. Stable splenomegaly, consistent with portal venous hypertension. No evidence of hepatic neoplasm.    05/15/2017 Tumor Marker    Patient's tumor was tested for the following markers: CA125 Results of the tumor marker test revealed 56.1    06/12/2017 Tumor Marker    Patient's tumor was tested for the following markers: CA125 Results of the tumor marker test revealed 49.2    07/10/2017 Tumor Marker    Patient's tumor was tested for the following markers: CA125 Results of the tumor marker test revealed 46.3    08/07/2017 Tumor Marker    Patient's tumor was tested for the following markers: CA125 Results of the tumor marker test revealed 52.9    08/20/2017 Imaging    1. Mild omental/peritoneal haziness, unchanged. No evidence of new metastatic disease. 2. Cirrhosis with splenomegaly.    09/04/2017 Tumor Marker    Patient's tumor was tested for the following  markers: CA125 Results of the tumor marker test revealed 48.5    11/13/2017 Tumor Marker    Patient's tumor was tested for the following markers: CA125 Results of the tumor marker test revealed 55.8       12/18/2017                    Bevacizumab: 10 mg/m  Ferumoxytol: 510 mg IVPP  REVIEW OF SYSTEMS:   Constitutional: Denies fevers, chills or abnormal weight loss Ears, nose, mouth, throat, and face: Denies mucositis or sore throat Respiratory: Denies cough, dyspnea or wheezes Cardiovascular: Denies palpitation, chest discomfort or lower extremity swelling Gastrointestinal:  Denies nausea, heartburn or change in bowel habits Skin: Denies abnormal skin rashes Lymphatics: Denies new lymphadenopathy or easy bruising Neurological:Denies numbness, tingling or new weaknesses Behavioral/Psych: Mood is stable, no new changes  All other systems were reviewed with the patient and are negative.  Past Medical History Reviewed        Family History Reviewed       Social History Reviewed  ALLERGIES:  is allergic to shellfish allergy; benadryl [diphenhydramine hcl]; penicillins; and tape.  MEDICATIONS:  Current Outpatient Medications  Medication Sig Dispense Refill  . bisoprolol (ZEBETA) 10 MG tablet Take 1 tablet (10 mg total) by mouth daily. 30 tablet 11  . cholecalciferol (VITAMIN D) 1000 units tablet Take 1,000 Units by mouth daily.    Marland Kitchen gabapentin (NEURONTIN) 300 MG capsule Take 2 capsules (600 mg total) by mouth 2 (two) times daily. 120 capsule 11  . hydrochlorothiazide (MICROZIDE) 12.5 MG capsule Take 12.5 mg by mouth daily.  1  . HYDROcodone-acetaminophen (NORCO/VICODIN) 5-325 MG tablet take 1 tablet by mouth every 6 hours if needed for MODERATE PAIN 30 tablet 0  . lidocaine-prilocaine (EMLA) cream Apply to Porta-cath  1-2 hours prior to access as directed. 30 g 9  . metFORMIN (GLUCOPHAGE-XR) 500 MG 24 hr tablet Take 1,000 mg by mouth 2 (two) times  daily.   0  . ondansetron (ZOFRAN) 8 MG tablet Take 1 tablet (8 mg total) by mouth every 8 (eight) hours as needed for nausea. 30 tablet 3  . pantoprazole (PROTONIX) 40 MG tablet Take 40 mg by mouth daily.   0  . promethazine (PHENERGAN) 25 MG tablet Take 1 tablet (25 mg total) by mouth every 6 (six) hours as needed for nausea. 30 tablet 3  . rotigotine (NEUPRO) 4 MG/24HR Place 1 patch onto the skin daily. 30 patch 6   No current facility-administered medications for this visit.    PHYSICAL EXAMINATION: ECOG PERFORMANCE STATUS: 1 - Symptomatic but completely ambulatory GENERAL:alert, no distress and comfortable SKIN: skin color, texture, turgor are normal, no rashes or significant lesions EYES: normal, Conjunctiva are pink and non-injected, sclera clear OROPHARYNX:no exudate, no erythema and lips, buccal mucosa, and tongue normal  NECK: supple, thyroid normal size, non-tender, without nodularity LYMPH:  no palpable lymphadenopathy in the cervical, axillary or inguinal LUNGS: clear to auscultation and percussion with normal breathing effort HEART: regular rate & rhythm and no murmurs and no lower extremity edema ABDOMEN:abdomen soft, non-tender and normal bowel sounds Musculoskeletal:no cyanosis of digits and no clubbing  NEURO: alert & oriented x 3 with fluent speech, no focal motor/sensory deficits  LABORATORY DATA:  I have reviewed the data as listed December 17, 2017  Ref Range & Units 1d ago (12/17/17) 29moago (11/13/17) 245mogo (10/02/17) 45m46moo (09/18/17) 45mo36mo (09/04/17)  WBC 4.0 - 10.5 K/uL 4.2  4.1 R 4.1 R 3.9 R 3.7Low  R  RBC 3.87 - 5.11 MIL/uL 3.89  4.18 R 4.19 R 4.28 R 4.14 R  Hemoglobin 12.0 - 15.0 g/dL 11.3Low   12.5 R 12.5 R 13.0 R 12.4 R  HCT 36.0 - 46.0 % 35.9Low   38.1 R 39.3 R 40.2 R 37.8 R  MCV 80.0 - 100.0 fL 92.3  91.0 R 93.8 R 93.9 R 91.3 R  MCH 26.0 - 34.0 pg 29.0  29.8 R 29.8 R 30.4 R 29.9 R  MCHC 30.0 - 36.0 g/dL 31.5  32.7 R 31.8 R 32.3 R 32.7 R  RDW 11.5  - 15.5 % 15.9High   16.8High  R 16.1High  R 16.2High  R 18.0High  R  Platelets 150 - 400 K/uL 94Low   80Low  R 73Low  R 78Low  R 76Low  R  nRBC 0.0 - 0.2 % 0.0       Neutrophils Relative % % 61  65  66  62  62   Neutro Abs 1.7 - 7.7 K/uL 2.5  2.7 R 2.7 R 2.4 R 2.3 R  Lymphocytes Relative % 31  28  27  31  30    Lymphs Abs 0.7 - 4.0 K/uL 1.3  1.2 R 1.1 R 1.2 R 1.1 R  Monocytes Relative % 5  5  5  5  4    Monocytes Absolute 0.1 - 1.0 K/uL 0.2  0.2 R 0.2 R 0.2 R 0.2 R  Eosinophils Relative % 2  2  2  2  3    Eosinophils Absolute 0.0 - 0.5 K/uL 0.1  0.1  0.1  0.1  0.1   Basophils Relative % 1  0  0  0  1   Basophils Absolute 0.0 - 0.1 K/uL 0.0  0.0 CM 0.0 CM 0.0 CM 0.0 CM  Immature Granulocytes % 0       Abs Immature Granulocytes 0.00 - 0.07 K/uL 0.01         Ref Range & Units 1d ago (12/17/17) 32moago (11/13/17) 22mogo (10/02/17) 77m62moo (09/18/17) 77mo68mo (09/04/17)  Sodium 135 - 145 mmol/L 144  143  144  142  143   Potassium 3.5 - 5.1 mmol/L 4.3  4.1  4.0  4.6  4.2   Chloride 98 - 111 mmol/L 110  107  108  106  107   CO2 22 - 32 mmol/L 24  26  25  23  25    Glucose, Bld 70 - 99 mg/dL 99  201High   120High   118High   108High    BUN 8 - 23 mg/dL 16  15  19  18  19    Creatinine, Ser 0.44 - 1.00 mg/dL 0.85  0.84  0.83  0.93  0.86   Calcium 8.9 - 10.3 mg/dL 9.2  8.5Low   8.6Low   9.4  8.4Low    Total Protein 6.5 - 8.1 g/dL 7.5  7.6  7.5  7.4  7.2   Albumin 3.5 - 5.0 g/dL 3.8  3.8  3.8  3.8  3.8   AST 15 - 41 U/L 44High   36  41  42High   32   ALT 0 - 44 U/L 37  36  39  39  27   Alkaline Phosphatase 38 - 126 U/L 85  118  117  99  92   Total Bilirubin 0.3 - 1.2 mg/dL 0.7  0.9  0.9  1.0  1.0   GFR calc non Af Amer >60 mL/min >60  >60  >60  >60  >60   GFR calc Af Amer >60 mL/min >60  >60 CM >60 CM >60 CM >60 CM  Comment: (NOTE)   Ferritin 20 Iron/TIBC 56/421 Iron saturation 13%     Component Value Date/Time   NA 144 12/17/2017 0804   NA 142 02/06/2017  0742   K 4.3 12/17/2017 0804   K  3.9 02/06/2017 0742   CL 110 12/17/2017 0804   CO2 24 12/17/2017 0804   CO2 22 02/06/2017 0742   GLUCOSE 99 12/17/2017 0804   GLUCOSE 156 (H) 02/06/2017 0742   BUN 16 12/17/2017 0804   BUN 13.5 02/06/2017 0742   CREATININE 0.85 12/17/2017 0804   CREATININE 0.8 02/06/2017 0742   CALCIUM 9.2 12/17/2017 0804   CALCIUM 8.5 02/06/2017 0742   PROT 7.5 12/17/2017 0804   PROT 6.9 02/06/2017 0742   ALBUMIN 3.8 12/17/2017 0804   ALBUMIN 3.7 02/06/2017 0742   AST 44 (H) 12/17/2017 0804   AST 47 (H) 02/06/2017 0742   ALT 37 12/17/2017 0804   ALT 54 02/06/2017 0742   ALKPHOS 85 12/17/2017 0804   ALKPHOS 130 02/06/2017 0742   BILITOT 0.7 12/17/2017 0804   BILITOT 0.65 02/06/2017 0742   GFRNONAA >60 12/17/2017 0804   GFRAA >60 12/17/2017 0804   Lab Results  Component Value Date   WBC 4.2 12/17/2017   NEUTROABS 2.5 12/17/2017   HGB 11.3 (L) 12/17/2017   HCT 35.9 (L) 12/17/2017   MCV 92.3 12/17/2017   PLT 94 (L) 12/17/2017      Chemistry      Component Value Date/Time   NA 144 12/17/2017 0804   NA 142 02/06/2017 0742   K 4.3 12/17/2017 0804   K 3.9 02/06/2017 0742   CL 110 12/17/2017 0804   CO2 24 12/17/2017 0804   CO2 22 02/06/2017 0742   BUN 16 12/17/2017 0804   BUN 13.5 02/06/2017 0742   CREATININE 0.85 12/17/2017 0804   CREATININE 0.8 02/06/2017 0742      Component Value Date/Time   CALCIUM 9.2 12/17/2017 0804   CALCIUM 8.5 02/06/2017 0742   ALKPHOS 85 12/17/2017 0804   ALKPHOS 130 02/06/2017 0742   AST 44 (H) 12/17/2017 0804   AST 47 (H) 02/06/2017 0742   ALT 37 12/17/2017 0804   ALT 54 02/06/2017 0742   BILITOT 0.7 12/17/2017 0804   BILITOT 0.65 02/06/2017 0742     The total time spent discussing the her most recent laboratory studies, physical examination, role and rationale for continued immunotherapy, anemia of iron deficiency with replacement, and recommendations was 40 minutes.  At least 50% of that time was spent in face-to-face discussion, counseling, and  answering questions. There was ample time allotted to answer all questions.  This note was dictated using voice activated technology/software.  Unfortunately, typographical errors are not uncommon, and transcription is subject to mistakes and regrettably misinterpretation.  If necessary, clarification of the above information can be discussed with me at any time.   Henreitta Leber, MD  Hematology/Oncology [facility] [address] [place], [place] [address] Office: [phone] NKNL: [phone]

## 2017-12-29 NOTE — Progress Notes (Signed)
Olivia Benson was seen today in neurologic consultation at the request of Dr. Marko Plume. Her PCP is Helane Rima, MD.  The consultation is for RLS.  Prior records were reviewed.  The patient also has a history of primary peritoneal carcinomatosis, grade 3.  She has undergone previous debulking surgery and chemotherapy with carboplatin and Taxol, which was last completed on 05/30/2014.  Her chemotherapy has been complicated by neutropenia, peripheral neuropathy, symptomatically anemia and fatigue.  She presents today to discuss restless leg syndrome.  Pt states that her sx's consist of a crawling sensation on the legs; she feels that she has a spider web on the leg; "I just have to move."  It may have started pre-chemo but got much worse after chemo.  It starts mid afternoon once she sits and "gets quiet" and tries to watch TV.    She has previously tried flexeril, baclofen, ultram, restoril and oxycontin.  The only one that has been helpful is oxyIR.  She has never been on requip, mirapex, levodopa, klonopin.    She admits that she craves ice.  She has been anemic.  Her last Hgb was 10.6.    11/25/14 update:  The patient is following up today in regards to her restless leg syndrome that is likely secondary to iron deficiency anemia.  Last visit, I started her on pramipexole, 0.125 mg after dinner and 0.25 mg at night. When she wakes up at night, she has "bone pain" but no RLS sx's.  She is still on one oxycodone at night for the pain but has dropped that from 2 tablets.   I was able to get a copy of her TSH from January, 2015 and it was normal at 1.820.  Dr. Marko Plume did start her on oral iron after our last visit but unfortunately the patient did not tolerate that because of nausea and it was recommended that the patient try instead a multivitamin with iron and she couldn't tolerate that either.  Said that she actually tolerated oral iron better than MVI.  Said that her ice craving went away.  05/24/15  update:  The patient follows up today in regards to restless leg syndrome.  She is on pramipexole, 0.125 mg after dinner and 0.25 mg at night.  She has been worse but isn't sure if that is because she is no longer on her oxycontin.  Admits she isn't taking her iron supplements.   She isn't sleeping at all now.  Eating ice again.  She did get married since our last visit, but reports that she has been with the same gentleman for a long time.  States that her weight and BS have both been going up and she is frustrated with that.  She has had some arthritic pain and the indocin helps but she had some stomach pain.  07/28/15 update:  The patient is seen today in follow-up for restless leg syndrome, which has been secondary to iron deficiency.  Last visit, she was complaining about worsening symptoms and increased her pramipexole 0.5 mg, so that she was taking a half a tablet after dinner and one tablet at night.  She complains that this medication makes her cough and causes SOB so she only takes 1/2 after dinner and 1/2 at night. Last visit, I  also checked her iron, and the percent saturation was low at 7% and total iron was low at 34.  I told her that she absolutely needed to restart her iron supplements.  She  states that she is taking this and "it isn't helping and my cravings for ice is increasing."  Admits that she overall isn't feeling good.  "I have pain all over."  No taking diabetes meds as prescribed.    11/27/15 update:  Pt f/u today for RLS.  She thought that mirapex caused SOB and cough and although that would be very unusual, we d/c that last visit and changed it to neupro patch.  I thought that her iron was likely low last visit and I rechecked it, and it indeed was low.  Through Dr. Marko Plume, IV iron was arranged for the patient.  She felt that she did amazing after the 2nd infusion of iron.  She is now off of the iron.  She is also off of the patch because she is in the donut hole.  She quit the IV  iron because she started chemo.  She is starting to crave ice again but not like she was last visit and not SOB like she was before last visit. The records that were made available to me were reviewed.  Unfortunately, pt had recurrence of her primary peritoneal carcinomatosis since our last visit.  Her chemo was restarted.  04/02/16 update: Patient seen today in follow-up.  She was given samples of the Neupro patch last visit, 1 mg for her restless leg.  She had repeat ferritin and iron studies on 02/19/2016 and were unremarkable.  States that she really was doing well until started on tamoxifen and then the RLS started acting up.  She is having cramping of the legs as well and has been taking a significant amount of advil (8-12 per day).  07/31/16 update:  Patient in today in follow-up.  Her Neupro patch was increased last visit her restless leg to 2 mg daily.  She states today that she didn't think that it was helping but she forgot it one day and then "I paced the floors and cried."  Has been f/u with Dr. Alvy Bimler and I reviewed those records.  No tumor progression.  Tumors markers fluctuated somewhat.  On tamoxifen.  She did get rid of her advil but is taking hydrocodone.  She is still craving ice but cannot tolerate the iron supplements so isn't taking supplements  12/04/16 update: Patient is seen today in follow-up.  Patient has a history of restless leg syndrome secondary to iron deficiency anemia.  She is on the Neupro patch, which was increased to 4 mg daily last visit.  "That is working better."  She forgot it one day and "I wanted to hurt someone."   Dr. Alvy Bimler ordered ferritin, iron and TIBC but I do not see that those have been completed yet.  Pt states that "I will have them done tomorrow because I am eating ice again."  Started chemo in August and RLS sx's got worse.  Eating so much ice that has separate ice machine.  Is taking much less ibuprofen now.    06/11/17 update: Patient is seen today in  follow-up.  She has restless leg secondary to iron deficiency anemia.  Most recent iron stores (ferritin/iron/TIBC) were done on May 15, 2017 and were normal.  She is on Neupro patch, 4 mg daily.  "I have really wiggly days and days its not so bad."  On not so bad days, she may not wear patch but sx's will come back.  No site reactions.  Only "bad reaction" is the cost.  Records have been reviewed since our last  visit.  She was last seen by Dr. Alvy Bimler on May 15, 2017.  She has had no signs of disease progression clinically with her peritoneal carcinomatosis.  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  It is felt that her intermittent leukopenia and chronic thrombocytopenia is likely due to liver disease/splenomegaly.  12/31/17 update: Patient seen today in follow-up for restless leg.  Patient is on the Neupro patch, 4 mg daily.  If she doesn't have the patch on, she knows it.  Last ferritin was done on December 17, 2017 and was low at 20.  This has consistently been dropping compared to 4 months ago.  Her hemoglobin is 11.3.  Records are reviewed.  She was seen by oncology/hematology on December 18, 2017.  Because of her symptoms of restless leg and reduction in ferritin, she did receive IV iron on November 7.  She is feeling somewhat better.  She is still eating ice but less.  She had broken another tooth from eating ice.    PREVIOUS MEDICATIONS: n/a  ALLERGIES:   Allergies  Allergen Reactions  . Shellfish Allergy Anaphylaxis    HER THROAT SWELLS UP AND CLOSES  . Benadryl [Diphenhydramine Hcl] Other (See Comments)    Restless legs   . Penicillins Rash    WHEN SHE WAS TEENAGER  . Tape Itching    Red itch with some tapes    CURRENT MEDICATIONS:  Outpatient Encounter Medications as of 12/31/2017  Medication Sig  . bisoprolol (ZEBETA) 10 MG tablet Take 1 tablet (10 mg total) by mouth daily.  . cholecalciferol (VITAMIN D) 1000 units tablet Take 1,000 Units by mouth daily.  Marland Kitchen  gabapentin (NEURONTIN) 300 MG capsule Take 2 capsules (600 mg total) by mouth 2 (two) times daily.  . hydrochlorothiazide (MICROZIDE) 12.5 MG capsule Take 12.5 mg by mouth daily.  Marland Kitchen HYDROcodone-acetaminophen (NORCO/VICODIN) 5-325 MG tablet take 1 tablet by mouth every 6 hours if needed for MODERATE PAIN  . lidocaine-prilocaine (EMLA) cream Apply to Porta-cath  1-2 hours prior to access as directed.  . metFORMIN (GLUCOPHAGE-XR) 500 MG 24 hr tablet Take 1,000 mg by mouth 2 (two) times daily.   . ondansetron (ZOFRAN) 8 MG tablet Take 1 tablet (8 mg total) by mouth every 8 (eight) hours as needed for nausea.  . pantoprazole (PROTONIX) 40 MG tablet Take 40 mg by mouth daily.   . promethazine (PHENERGAN) 25 MG tablet Take 1 tablet (25 mg total) by mouth every 6 (six) hours as needed for nausea.  . rotigotine (NEUPRO) 4 MG/24HR Place 1 patch onto the skin daily.  . [DISCONTINUED] rotigotine (NEUPRO) 4 MG/24HR Place 1 patch onto the skin daily.  . [DISCONTINUED] rotigotine (NEUPRO) 4 MG/24HR Place 1 patch onto the skin daily.   No facility-administered encounter medications on file as of 12/31/2017.     PAST MEDICAL HISTORY:   Past Medical History:  Diagnosis Date  . Asthma   . Cirrhosis ([place])   . Complication of anesthesia    slow to wake up / n & v  . Diabetes mellitus   . Elevated LFTs   . Family history of adverse reaction to anesthesia    multiple family members have had problems such as slow to wake / "flat lined" /  ventilator    . Frequency of urination   . Glaucoma, narrow-angle   . History of skin cancer   . Hypertension   . Non-ST elevated myocardial infarction (non-STEMI) [facility]) Sept 2012   Mild  elevation in troponin and CK MB but no evidence of coronary disease at time of cath  . Obesity   . Ovarian cancer ([place]) 11/22/2013   Disseminated ovarian cancer  . Overactive bladder   . PONV (postoperative nausea and vomiting)   . Portal hypertension ([place])   . Restless leg syndrome     . Tingling    hands and feet    PAST SURGICAL HISTORY:   Past Surgical History:  Procedure Laterality Date  . ABDOMINAL ULTRASOUND  Sept 2012   No gallbladder disease, + fatty liver  . CARDIAC CATHETERIZATION  10/25/2010   EF 60%; Normal coronaries  . COLONOSCOPY  10/18/2005  . COSMETIC SURGERY  2003  . DENTAL SURGERY    . EYE SURGERY Bilateral 09/19/14 R eye, 08/2014 L eye   open angles in both eyes d/t narrow angle glaucoma  . LAPAROTOMY N/A 03/29/2014   Procedure: EXPLORATORY LAPAROTOMY;  Surgeon: Everitt Amber, MD;  Location: [facility];  Service: Gynecology;  Laterality: N/A;  . PARTIAL HYSTERECTOMY  1988  . SALPINGOOPHORECTOMY Bilateral 03/29/2014   Procedure: BILATERAL SALPINGO OOPHORECTOMY, OMENTECTOMY, DEBULKING;  Surgeon: Everitt Amber, MD;  Location: [facility];  Service: Gynecology;  Laterality: Bilateral;  . TONSILLECTOMY  1971  . TUBAL LIGATION    . [facility]  11/24/2009    SOCIAL HISTORY:   Social History   Socioeconomic History  . Marital status: Married    Spouse name: Not on file  . Number of children: 1 D  . Years of education: Not on file  . Highest education level: Not on file  Occupational History  . Occupation: Retired  Scientific laboratory technician  . Financial resource strain: Not on file  . Food insecurity:    Worry: Not on file    Inability: Not on file  . Transportation needs:    Medical: Not on file    Non-medical: Not on file  Tobacco Use  . Smoking status: Former Smoker    Packs/day: 1.00    Years: 1.00    Pack years: 1.00    Types: Cigarettes    Last attempt to quit: 02/11/1982    Years since quitting: 35.9  . Smokeless tobacco: Never Used  Substance and Sexual Activity  . Alcohol use: No    Alcohol/week: 0.0 standard drinks  . Drug use: No  . Sexual activity: Not on file  Lifestyle  . Physical activity:    Days per week: Not on file    Minutes per session: Not on file  . Stress: Not on file  Relationships  . Social connections:    Talks on phone: Not  on file    Gets together: Not on file    Attends religious service: Not on file    Active member of club or organization: Not on file    Attends meetings of clubs or organizations: Not on file    Relationship status: Not on file  . Intimate partner violence:    Fear of current or ex partner: Not on file    Emotionally abused: Not on file    Physically abused: Not on file    Forced sexual activity: Not on file  Other Topics Concern  . Not on file  Social History Narrative  . Not on file    FAMILY HISTORY:   Family Status  Relation Name Status  . Mother  Alive       arthritis, htn, DM  . Father  Deceased at age 80  bladder cancer, stroke  . Sister  Alive       breast cancer  . Sister  Alive       htn  . Sister  Alive       htn  . Sister  Alive       htn  . [employer]  . Cousin  Alive  . Mat Uncle  Deceased at age infancy  . MGM  Deceased at age 23       pneumonia  . MGF  Deceased  . PGM  Deceased  . PGF  Deceased  . [employer]  . [employer]  . Mat [employer]  . Son  Deceased at age 57       died of brain anerysm, after a 20 ft fall  . Brother  Scientist, forensic  . Brother  (Not Specified)    ROS:  Review of Systems  Constitutional: Positive for malaise/fatigue.  HENT: Negative.   Eyes: Negative.   Respiratory: Negative.   Cardiovascular: Negative.   Skin: Negative.      PHYSICAL EXAMINATION:    VITALS:   Vitals:   12/31/17 0925  BP: (!) 142/82  Pulse: 70  SpO2: 97%  Weight: 185 lb (83.9 kg)  Height: 5' 2"  (1.575 m)    GEN:  Normal appears female in no acute distress.  Appears stated age. HEENT:  Normocephalic, atraumatic. The mucous membranes are moist. The superficial temporal arteries are without ropiness or tenderness. Cardiovascular: Regular rate and rhythm. Lungs: Clear to auscultation bilaterally. Neck/Heme: There are no carotid bruits noted bilaterally.  NEUROLOGICAL: Orientation:  The patient is alert and oriented  x 3.   Cranial nerves: There is good facial symmetry.  Speech is fluent and clear. Soft palate rises symmetrically and there is no tongue deviation. Hearing is intact to conversational tone. Tone: Tone is good throughout. Sensation: Sensation is intact to light touch throughout Coordination:  The patient has no difficulty with RAM's or FNF bilaterally. Motor: Strength is at least antigravity x4. Gait and Station: The patient is able to ambulate without difficulty.   Lab Results  Component Value Date   WBC 4.2 12/17/2017   HGB 11.3 (L) 12/17/2017   HCT 35.9 (L) 12/17/2017   MCV 92.3 12/17/2017   PLT 94 (L) 12/17/2017   Lab Results  Component Value Date   TSH 1.119 10/13/2014   Lab Results  Component Value Date   IRON 56 12/17/2017   TIBC 421 12/17/2017   FERRITIN 20 12/17/2017     Chemistry      Component Value Date/Time   NA 144 12/17/2017 0804   NA 142 02/06/2017 0742   K 4.3 12/17/2017 0804   K 3.9 02/06/2017 0742   CL 110 12/17/2017 0804   CO2 24 12/17/2017 0804   CO2 22 02/06/2017 0742   BUN 16 12/17/2017 0804   BUN 13.5 02/06/2017 0742   CREATININE 0.85 12/17/2017 0804   CREATININE 0.8 02/06/2017 0742      Component Value Date/Time   CALCIUM 9.2 12/17/2017 0804   CALCIUM 8.5 02/06/2017 0742   ALKPHOS 85 12/17/2017 0804   ALKPHOS 130 02/06/2017 0742   AST 44 (H) 12/17/2017 0804   AST 47 (H) 02/06/2017 0742   ALT 37 12/17/2017 0804   ALT 54 02/06/2017 0742   BILITOT 0.7 12/17/2017 0804   BILITOT 0.65 02/06/2017 1779  IMPRESSION/PLAN  1. RLS, secondary to iron def anemia  -Serum ferritin was low on November 6 and she subsequently had IV iron on November 7.  -She will continue with the Neupro patch, 4 mg daily.  She was given 2 months worth of samples today.  RX refill as well.  -advised to stop eating ice.  Multiple breakages of teeth from same  2.   Thrombocytopenia  -chronic due to liver cirrhosis from NASH  3.  F/u 6 months

## 2017-12-31 ENCOUNTER — Encounter: Payer: Self-pay | Admitting: Neurology

## 2017-12-31 ENCOUNTER — Ambulatory Visit (INDEPENDENT_AMBULATORY_CARE_PROVIDER_SITE_OTHER): Payer: Medicare Other | Admitting: Neurology

## 2017-12-31 VITALS — BP 142/82 | HR 70 | Ht 62.0 in | Wt 185.0 lb

## 2017-12-31 DIAGNOSIS — G2581 Restless legs syndrome: Secondary | ICD-10-CM | POA: Diagnosis not present

## 2017-12-31 DIAGNOSIS — E611 Iron deficiency: Secondary | ICD-10-CM

## 2017-12-31 MED ORDER — ROTIGOTINE 4 MG/24HR TD PT24: 1.0000 | MEDICATED_PATCH | Freq: Every day | TRANSDERMAL | 6 refills | Status: DC

## 2017-12-31 MED ORDER — ROTIGOTINE 4 MG/24HR TD PT24: 1.0000 | MEDICATED_PATCH | Freq: Every day | TRANSDERMAL | 0 refills | Status: DC

## 2018-01-01 ENCOUNTER — Other Ambulatory Visit: Payer: Medicare Other

## 2018-01-01 ENCOUNTER — Ambulatory Visit: Payer: Medicare Other

## 2018-01-02 ENCOUNTER — Inpatient Hospital Stay (HOSPITAL_BASED_OUTPATIENT_CLINIC_OR_DEPARTMENT_OTHER): Payer: Medicare Other | Admitting: Hematology and Oncology

## 2018-01-02 ENCOUNTER — Inpatient Hospital Stay: Payer: Medicare Other

## 2018-01-02 ENCOUNTER — Encounter: Payer: Self-pay | Admitting: Hematology and Oncology

## 2018-01-02 VITALS — BP 150/66 | HR 61 | Temp 97.6°F | Resp 18 | Ht 62.0 in | Wt 186.8 lb

## 2018-01-02 VITALS — BP 153/65

## 2018-01-02 DIAGNOSIS — C569 Malignant neoplasm of unspecified ovary: Secondary | ICD-10-CM

## 2018-01-02 DIAGNOSIS — Z79899 Other long term (current) drug therapy: Secondary | ICD-10-CM | POA: Diagnosis not present

## 2018-01-02 DIAGNOSIS — C482 Malignant neoplasm of peritoneum, unspecified: Secondary | ICD-10-CM

## 2018-01-02 DIAGNOSIS — D696 Thrombocytopenia, unspecified: Secondary | ICD-10-CM

## 2018-01-02 DIAGNOSIS — K769 Liver disease, unspecified: Secondary | ICD-10-CM

## 2018-01-02 DIAGNOSIS — Z7984 Long term (current) use of oral hypoglycemic drugs: Secondary | ICD-10-CM

## 2018-01-02 DIAGNOSIS — Z9221 Personal history of antineoplastic chemotherapy: Secondary | ICD-10-CM

## 2018-01-02 DIAGNOSIS — Z5112 Encounter for antineoplastic immunotherapy: Secondary | ICD-10-CM

## 2018-01-02 DIAGNOSIS — M1990 Unspecified osteoarthritis, unspecified site: Secondary | ICD-10-CM

## 2018-01-02 DIAGNOSIS — G8929 Other chronic pain: Secondary | ICD-10-CM

## 2018-01-02 DIAGNOSIS — D509 Iron deficiency anemia, unspecified: Secondary | ICD-10-CM

## 2018-01-02 DIAGNOSIS — G2581 Restless legs syndrome: Secondary | ICD-10-CM

## 2018-01-02 DIAGNOSIS — I10 Essential (primary) hypertension: Secondary | ICD-10-CM

## 2018-01-02 DIAGNOSIS — R161 Splenomegaly, not elsewhere classified: Secondary | ICD-10-CM

## 2018-01-02 DIAGNOSIS — M545 Low back pain: Secondary | ICD-10-CM

## 2018-01-02 LAB — COMPREHENSIVE METABOLIC PANEL
ALT: 47 U/L — ABNORMAL HIGH (ref 0–44)
AST: 51 U/L — ABNORMAL HIGH (ref 15–41)
Albumin: 3.8 g/dL (ref 3.5–5.0)
Alkaline Phosphatase: 124 U/L (ref 38–126)
Anion gap: 14 (ref 5–15)
BUN: 17 mg/dL (ref 8–23)
CO2: 22 mmol/L (ref 22–32)
Calcium: 8.7 mg/dL — ABNORMAL LOW (ref 8.9–10.3)
Chloride: 109 mmol/L (ref 98–111)
Creatinine, Ser: 0.91 mg/dL (ref 0.44–1.00)
GFR calc Af Amer: >60 mL/min (ref >60)
GFR calc non Af Amer: >60 mL/min (ref >60)
Glucose, Bld: 133 mg/dL — ABNORMAL HIGH (ref 70–99)
Potassium: 4.2 mmol/L (ref 3.5–5.1)
Sodium: 145 mmol/L (ref 135–145)
Total Bilirubin: 0.8 mg/dL (ref 0.3–1.2)
Total Protein: 7.5 g/dL (ref 6.5–8.1)

## 2018-01-02 LAB — CBC WITH DIFFERENTIAL/PLATELET
Abs Immature Granulocytes: 0.02 10*3/uL (ref 0.00–0.07)
Basophils Absolute: 0 10*3/uL (ref 0.0–0.1)
Basophils Relative: 0 %
Eosinophils Absolute: 0.1 10*3/uL (ref 0.0–0.5)
Eosinophils Relative: 2 %
HCT: 38.5 % (ref 36.0–46.0)
Hemoglobin: 12.2 g/dL (ref 12.0–15.0)
Immature Granulocytes: 1 %
Lymphocytes Relative: 33 %
Lymphs Abs: 1.4 10*3/uL (ref 0.7–4.0)
MCH: 29.8 pg (ref 26.0–34.0)
MCHC: 31.7 g/dL (ref 30.0–36.0)
MCV: 94.1 fL (ref 80.0–100.0)
Monocytes Absolute: 0.2 10*3/uL (ref 0.1–1.0)
Monocytes Relative: 5 %
Neutro Abs: 2.5 10*3/uL (ref 1.7–7.7)
Neutrophils Relative %: 59 %
Platelets: 80 10*3/uL — ABNORMAL LOW (ref 150–400)
RBC: 4.09 MIL/uL (ref 3.87–5.11)
RDW: 17.5 % — ABNORMAL HIGH (ref 11.5–15.5)
WBC: 4.2 10*3/uL (ref 4.0–10.5)
nRBC: 0 % (ref 0.0–0.2)

## 2018-01-02 MED ORDER — SODIUM CHLORIDE 0.9% FLUSH
10.0000 mL | INTRAVENOUS | Status: DC | PRN
  Administered 2018-01-02: 10 mL
  Filled 2018-01-02: qty 10

## 2018-01-02 MED ORDER — HEPARIN SOD (PORK) LOCK FLUSH 100 UNIT/ML IV SOLN
500.0000 [IU] | Freq: Once | INTRAVENOUS | Status: AC | PRN
  Administered 2018-01-02: 500 [IU]
  Filled 2018-01-02: qty 5

## 2018-01-02 MED ORDER — SODIUM CHLORIDE 0.9 % IV SOLN
10.3000 mg/kg | Freq: Once | INTRAVENOUS | Status: AC
  Administered 2018-01-02: 900 mg via INTRAVENOUS
  Filled 2018-01-02: qty 32

## 2018-01-02 MED ORDER — SODIUM CHLORIDE 0.9 % IV SOLN
Freq: Once | INTRAVENOUS | Status: AC
  Administered 2018-01-02: 15:00:00 via INTRAVENOUS
  Filled 2018-01-02: qty 250

## 2018-01-02 NOTE — Patient Instructions (Signed)
[facility] Discharge Instructions for Patients Receiving Chemotherapy  Today you received the following chemotherapy agents Bevacizumab (Avastin).  To help prevent nausea and vomiting after your treatment, we encourage you to take your nausea medication as prescribed.   If you develop nausea and vomiting that is not controlled by your nausea medication, call the clinic.   BELOW ARE SYMPTOMS THAT SHOULD BE REPORTED IMMEDIATELY:  *FEVER GREATER THAN 100.5 F  *CHILLS WITH OR WITHOUT FEVER  NAUSEA AND VOMITING THAT IS NOT CONTROLLED WITH YOUR NAUSEA MEDICATION  *UNUSUAL SHORTNESS OF BREATH  *UNUSUAL BRUISING OR BLEEDING  TENDERNESS IN MOUTH AND THROAT WITH OR WITHOUT PRESENCE OF ULCERS  *URINARY PROBLEMS  *BOWEL PROBLEMS  UNUSUAL RASH Items with * indicate a potential emergency and should be followed up as soon as possible.  Feel free to call the clinic should you have any questions or concerns. The clinic phone number is [phone].  Please show the [facility] at check-in to the Emergency Department and triage nurse.  Ferumoxytol injection What is this medicine? FERUMOXYTOL is an iron complex. Iron is used to make healthy red blood cells, which carry oxygen and nutrients throughout the body. This medicine is used to treat iron deficiency anemia in people with chronic kidney disease. This medicine may be used for other purposes; ask your health care provider or pharmacist if you have questions. COMMON BRAND NAME(S): Feraheme What should I tell my health care provider before I take this medicine? They need to know if you have any of these conditions: -anemia not caused by low iron levels -high levels of iron in the blood -magnetic resonance imaging (MRI) test scheduled -an unusual or allergic reaction to iron, other medicines, foods, dyes, or preservatives -pregnant or trying to get pregnant -breast-feeding How should I use this medicine? This  medicine is for injection into a vein. It is given by a health care professional in a hospital or clinic setting. Talk to your pediatrician regarding the use of this medicine in children. Special care may be needed. Overdosage: If you think you have taken too much of this medicine contact a poison control center or emergency room at once. NOTE: This medicine is only for you. Do not share this medicine with others. What if I miss a dose? It is important not to miss your dose. Call your doctor or health care professional if you are unable to keep an appointment. What may interact with this medicine? This medicine may interact with the following medications: -other iron products This list may not describe all possible interactions. Give your health care provider a list of all the medicines, herbs, non-prescription drugs, or dietary supplements you use. Also tell them if you smoke, drink alcohol, or use illegal drugs. Some items may interact with your medicine. What should I watch for while using this medicine? Visit your doctor or healthcare professional regularly. Tell your doctor or healthcare professional if your symptoms do not start to get better or if they get worse. You may need blood work done while you are taking this medicine. You may need to follow a special diet. Talk to your doctor. Foods that contain iron include: whole grains/cereals, dried fruits, beans, or peas, leafy green vegetables, and organ meats (liver, kidney). What side effects may I notice from receiving this medicine? Side effects that you should report to your doctor or health care professional as soon as possible: -allergic reactions like skin rash, itching or hives, swelling of the  face, lips, or tongue -breathing problems -changes in blood pressure -feeling faint or lightheaded, falls -fever or chills -flushing, sweating, or hot feelings -swelling of the ankles or feet Side effects that usually do not require medical  attention (report to your doctor or health care professional if they continue or are bothersome): -diarrhea -headache -nausea, vomiting -stomach pain This list may not describe all possible side effects. Call your doctor for medical advice about side effects. You may report side effects to FDA at 1-800-FDA-1088. Where should I keep my medicine? This drug is given in a hospital or clinic and will not be stored at home. NOTE: This sheet is a summary. It may not cover all possible information. If you have questions about this medicine, talk to your doctor, pharmacist, or health care provider.  2018 Elsevier/Gold Standard (2015-03-02 12:41:49)

## 2018-01-02 NOTE — Progress Notes (Signed)
Per Dr. Audelia Hives ok to treat with platelets of 80

## 2018-01-02 NOTE — Patient Instructions (Signed)
We discussed in detail the results of your lab work from today.  Avastin will be given as scheduled.  Barring any unforeseen complications, your next scheduled doctor visit with Dr. Alvy Bimler which is on December 5.    Please do not hesitate to call should any new or untoward problems arise.  Happy Thanksgiving!

## 2018-01-02 NOTE — Progress Notes (Signed)
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 She is not symptomatic. Her treatment was previously placed on hold in anticipation for cataract surgery, now complete. CT imaging of the abdomen and pelvis from November 6 failed to reveal any evidence of progressive disease. She is scheduled for single agent bevacizumab today. Labs reviewed; platelets 80,000 (see below) She has no bleeding diathesis. A follow-up visit/treatment with lab work has been scheduled for December 5 with Dr. Alvy Bimler.  Iron deficiency anemia She has symptoms of restless leg. Her serum ferritin was 20 on November 7. She received parenteral iron on November 7. Her overall energy level has improved.   Essential hypertension Her blood pressure is intermittently elevated, likely due to bevacizumab. There was no proteinuria. She is not symptomatic. Dietary modification was encouraged.  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 She has no bleeding tendency.   Recommend close observation only Her cataract surgery went unremarkably She also had a single tooth extracted prior to her last treatment.  Low back pain She has chronic joint pain and arthritis Previously gabapentin was prescribed along with hydrocodone/APAP: 5/325 as needed We will continue the same and I have refilled her prescriptions today. Her pain is controlled.  INTERVAL HISTORY: Please see below for problem oriented charting. She returns for further follow-up in anticipation of continuing bevacizumab. Her treatment was placed on hold due to cataract surgery.   She feels well. Denies  abdominal bloating, constipation or changes in bowel habits Her appetite is stable; energy level improved since iron infusion. She has stable chronic lower back pain and occasional restless leg syndrome Olivia Benson denies spontaneous epistaxis, melena, bright red blood per rectum, hematuria or hematochezia.  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  No worrisome liver mass.  There are associated portal venous collaterals and splenomegaly consistent with portal venous hypertension. 2.  No other significant upper abdominal findings    11/08/2013 Imaging    US showed ascites    11/08/2013 Initial Diagnosis    Patient had ~ 2 months of some urinary incontinence, saw Dr Ronita Hipps in 10-2013, with pelvic mass on exam     11/12/2013 Imaging    There are findings of progressive hepatic cirrhosis with a large amount of ascites and mild splenomegaly. There are venous collaterals present. 2. There is abnormal thickening of the peritoneal surface along the left mid and lower abdominal wall. There is abnormal soft tissue density in the pelvis as well which could reflect the clinically suspected ovarian malignancy but the findings are nondiagnostic. 3. There is a new small left pleural effusion. 4. There is no acute bowel abnormality.    11/18/2013 Imaging    Successful ultrasound guided diagnostic and therapeutic paracentesis yielding 4.1 liters of ascites.     11/18/2013 Pathology Results    PERITONEAL/ASCITIC FLUID (SPECIMEN 1 OF 1 COLLECTED 11/18/13): SEROUS CARCINOMA, PLEASE SEE COMMENT.    12/01/2013 Tumor Marker    Patient's tumor was tested for the following markers: CA125 Results of the tumor marker test revealed 1938  12/03/2013 Imaging    Successful ultrasound-guided therapeutic paracentesis yielding 3.6 liters of  peritoneal fluid.    12/07/2013 - 05/30/2014 Chemotherapy    She received 3 cycles of carboplatin and Taxol, interrupted cycle 4 for surgery.  She subsequently completed 3 more cycles of chemotherapy after surgery    12/20/2013 Imaging    Successful ultrasound-guided diagnostic and therapeutic paracentesis yielding 2.2 liters of peritoneal fluid    12/20/2013 Pathology Results    PERITONEAL/ASCITIC FLUID(SPECIMEN 1 OF 1 COLLECTED 12/20/13): MALIGNANT CELLS CONSISTENT WITH SEROUS CARCINOMA    01/13/2014 Tumor Marker    Patient's tumor was tested for the following markers: CA125 Results of the tumor marker test revealed 227    02/07/2014 Tumor Marker    Patient's tumor was tested for the following markers: CA125 Results of the tumor marker test revealed 130    02/15/2014 Genetic Testing    Genetics testing from 02-2014 normal ([employer] panel)    02/23/2014 Imaging    Interval decrease in ascites. 2. Morphologic changes in the liver consistent with cirrhosis. 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 Portal vein remains patent at this time. 3. Persistent but decreased abnormal soft tissue attenuation tracking in the omental fat. This may be secondary to interval improvement in metastatic disease. 4. Peritoneal thickening seen in the left abdomen and pelvis on the previous study has decreased and nearly resolved in the interval. This also suggests interval improvement in metastatic disease.     03/07/2014 Procedure    Ultrasound and fluoroscopically guided right internal jugular single lumen power port catheter insertion. Tip in the SVC/RA junction. Catheter ready for use.    03/17/2014 Tumor Marker    Patient's tumor was tested for the following markers: CA125 Results of the tumor marker test revealed 45    03/29/2014 Pathology Results    1. Omentum, resection for tumor - HIGH GRADE SEROUS CARCINOMA, SEE COMMENT. 2. Ovary and fallopian tube, right - HIGH  GRADE SEROUS CARCINOMA, SEE COMMENT. 3. Ovary and fallopian tube, left - HIGH GRADE SEROUS CARCINOMA, SEE COMMENT. Diagnosis Note 1. Nests and clusters of malignant cells are invading the omental tissue (part #1) with associated fibrosis. The cells are pleomorphic with prominent nucleoli. There are scattered psammoma bodies. The ovaries are atrophic and exhibit multiple foci of surface based invasive carcinoma and associated fibrosis. The fallopian tubes have a few foci of carcinoma, also superficially located. There are no precursor lesions noted in the ovaries or fallopian tubes (the entire tubes and ovaries were submitted for evaluation). While there is some retraction artifact, there are several foci suspicious for lymphovascular invasion. 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 Given the fibrosis, there does appear to be a small amount of treatment effect, however, there is abundant residual tumor.     03/29/2014 Surgery    Procedure(s) Performed: 1. Exploratory laparotomy with bilateral salpingo-oophorectomy, omentectomy radical tumor debulking for ovarian cancer .  Surgeon: Thereasa Solo, MD.  Assistant Surgeon: Lahoma Crocker, M.D. Assistant: (an MD assistant was necessary for tissue manipulation, retraction and positioning due to the complexity of the case and hospital policies).  Operative Findings: 10cm omental cake from hepatic to splenic flexure, densely adherent to transverse colon. Milliary studding of tumor implants (<60m, too numerous in number to count) adherent to the mesentery of the small bowel and small bowel wall. Small volume (100cc) ascites.  Small ovaries bilaterally, densely adherent to the pelvic cul de sac. Left ovary and tube densely adherent to sigmoid colon. Cirrhotic liver with hepatomegaly and splenomegaly.     This represented  an optimal cytoreduction (R1) with visible disease residual on the bowel wall and mesentery (millial, <40m implants).      04/18/2014 Tumor Marker    Patient's tumor was tested for the following markers: CA125 Results of the tumor marker test revealed 122    05/16/2014 Tumor Marker    Patient's tumor was tested for the following markers: CA125 Results of the tumor marker test revealed 30    06/10/2014 Tumor Marker    Patient's tumor was tested for the following markers: CA125 Results of the tumor marker test revealed 19    07/04/2014 Imaging    Interval improvement in the appearance of peritoneal metastasis secondary to ovarian cancer. 2. 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     08/18/2014 Tumor Marker    Patient's tumor was tested for the following markers: CA125 Results of the tumor marker test revealed 16    10/13/2014 Tumor Marker    Patient's tumor was tested for the following markers: CA125 Results of the tumor marker test revealed 14    11/21/2014 Tumor Marker    Patient's tumor was tested for the following markers: CA125 Results of the tumor marker test revealed 16    12/28/2014 Tumor Marker    Patient's tumor was tested for the following markers: CA125 Results of the tumor marker test revealed 762    02/27/2015 Tumor Marker    Patient's tumor was tested for the following markers: CA125 Results of the tumor marker test revealed 20.2    03/22/2015 Imaging    Filler appearance to the prior exam, with very subtle fluid tracking along portions of the liver, and very minimal nodularity along the right paracolic gutter representing residua from the prior peritoneal cancer. The current abnormalities could simply be post therapy findings rather than necessarily representing residual malignancy. No new or enlarging lesions are identified. 2. Hepatic cirrhosis and splenomegaly. There is some gastric varices suggesting portal venous hypertension. 3. Left foraminal  stenosis at L4-5 due to spurring. There is likely also some central narrowing of the thecal sac at this level. 4. Bibasilar scarring. 5. Chronic mild left mid kidney scarring    03/27/2015 Tumor Marker    Patient's tumor was tested for the following markers: CA125 Results of the tumor marker test revealed 25.3    04/24/2015 Imaging    Disc bulge L2-3 with mild spinal stenosis and right foraminal encroachment. 2. Disc bulge L3-4 with mild spinal stenosis. 3. Multifactorial spinal, left lateral recess and foraminal stenosis L4-5. There is grade 1 anterolisthesis without evident dynamic instability.    05/29/2015 Tumor Marker    Patient's tumor was tested for the following markers: CA125 Results of the tumor marker test revealed 29.9    08/21/2015 Tumor Marker    Patient's tumor was tested for the following markers: CA125 Results of the tumor marker test revealed 45    09/18/2015 Tumor Marker    Patient's tumor was tested for the following markers: CA125 Results of the tumor marker test revealed 47    09/27/2015 Imaging    New small bowel mesenteric nodule and enlarged left external iliac lymph node, highly worrisome for metastatic disease. 2. Cirrhosis and splenomegaly    10/10/2015 Tumor Marker    Patient's tumor was tested for the following markers: CA125 Results of  the tumor marker test revealed 53.4    10/10/2015 - 12/11/2015 Chemotherapy    She received 4 cycles of carboplatin only    11/20/2015 Tumor Marker    Patient's tumor was tested for the following markers: CA125 Results of the tumor marker test revealed 41.3    12/20/2015 Imaging    CT: Mild left external iliac lymphadenopathy is slightly decreased. Mildly enlarged lower mesenteric nodule is slightly decreased. 2. No new or progressive metastatic disease in the abdomen or pelvis. 3. Cirrhosis. Stable subcentimeter hypodense left liver lobe lesion. No new liver lesions. 4. Stable mild splenomegaly.  No ascites. 5. Mild sigmoid  diverticulosis.    01/01/2016 -  Anti-estrogen oral therapy    She was placed on tamoxifen. Had letrozole initially but switched to tamoxifen due to poor tolerance    01/15/2016 Tumor Marker    Patient's tumor was tested for the following markers: CA125 Results of the tumor marker test revealed 31.4    02/19/2016 Tumor Marker    Patient's tumor was tested for the following markers: CA125 Results of the tumor marker test revealed 36.5    05/13/2016 Imaging    No evidence of disease progression within the abdomen or pelvis. Previously noted small central mesenteric nodule and left external iliac node appear slightly smaller. 2. Stable changes of hepatic cirrhosis and portal hypertension with associated splenomegaly. No new or enlarging hepatic lesions are identified. 3. No acute findings.    05/13/2016 Tumor Marker    Patient's tumor was tested for the following markers: CA125 Results of the tumor marker test revealed 41.2    06/24/2016 Tumor Marker    Patient's tumor was tested for the following markers: CA125 Results of the tumor marker test revealed 47.3    08/20/2016 Imaging    1. The left external iliac lymph node is slightly increased in size compared to the prior exam, currently 1.1 cm and previously 0.9 cm in diameter. 2. Stable central mesenteric lymph node at 0.9 cm in diameter. 3. Stable trace free pelvic fluid and stable slight thickening along the right paracolic gutter, without well-defined peritoneal nodularity. 4. Other imaging findings of potential clinical significance: Subsegmental atelectasis or scarring in the lung bases. Hepatic cirrhosis. Left renal scarring. Mild splenomegaly. Sigmoid colon diverticulosis. Impingement at L4-5 due to spondylosis and degenerative disc disease broad Schmorl' s nodes at L3-L4. Pelvic floor laxity    08/23/2016 Tumor Marker    Patient's tumor was tested for the following markers: CA125 Results of the tumor marker test revealed 80.2     09/04/2016 Imaging    LV EF: 60% -   65%    09/12/2016 Tumor Marker    Patient's tumor was tested for the following markers: CA125 Results of the tumor marker test revealed 98.5    09/12/2016 -  Chemotherapy    The patient received Doxil and Avastin.     09/26/2016 Tumor Marker    Patient's tumor was tested for the following markers: CA125 Results of the tumor marker test revealed 68.9    10/10/2016 Tumor Marker    Patient's tumor was tested for the following markers: CA125 Results of the tumor marker test revealed 65.6    11/07/2016 Tumor Marker    Patient's tumor was tested for the following markers: CA125 Results of the tumor marker test revealed 46.4    11/29/2016 Imaging    Stable mild peritoneal thickening, suspicious for peritoneal carcinomatosis. No new or progressive disease identified. No evidence of ascites.  No significant  change in 10 mm left external iliac and 8 mm mesenteric lymph nodes.  Stable hepatic cirrhosis, and splenomegaly consistent with portal venous hypertension.  Colonic diverticulosis, without radiographic evidence of diverticulitis.    12/02/2016 Imaging    Normal LV size with EF 55%. Basal inferior and basal inferoseptal hypokinesis. Normal RV size and systolic function. No significant valvular abnormalities.    12/05/2016 Tumor Marker    Patient's tumor was tested for the following markers: CA125 Results of the tumor marker test revealed 49.1    01/09/2017 Tumor Marker    Patient's tumor was tested for the following markers: CA125 Results of the tumor marker test revealed 47.2    02/06/2017 Tumor Marker    Patient's tumor was tested for the following markers: CA125 Results of the tumor marker test revealed 44.1    02/18/2017 Imaging    Stable mild peritoneal thickening. No new or progressive disease identified within the abdomen or pelvis.  Stable tiny sub-cm left external iliac and mesenteric lymph nodes.  Stable hepatic cirrhosis, and  splenomegaly consistent with portal venous hypertension. No evidence of hepatic neoplasm.  Colonic diverticulosis, without radiographic evidence of diverticulitis.    03/06/2017 Tumor Marker    Patient's tumor was tested for the following markers: CA125 Results of the tumor marker test revealed 50.4    03/17/2017 Imaging    - Left ventricle: The cavity size was normal. Wall thickness was normal. Systolic function was normal. The estimated ejection fraction was in the range of 55% to 60%. Wall motion was normal; there were no regional wall motion abnormalities. Features are consistent with a pseudonormal left ventricular filling pattern, with concomitant abnormal relaxation and increased filling pressure (grade 2 diastolic dysfunction). - Mitral valve: There was mild regurgitation. - Left atrium: The atrium was mildly dilated    04/03/2017 Tumor Marker    Patient's tumor was tested for the following markers: CA125 Results of the tumor marker test revealed 47.2    05/14/2017 Imaging    CT scan of abdomen and pelvis Stable mild peritoneal thickening. No new or progressive disease within the abdomen or pelvis.  Stable hepatic cirrhosis. Stable splenomegaly, consistent with portal venous hypertension. No evidence of hepatic neoplasm.    05/15/2017 Tumor Marker    Patient's tumor was tested for the following markers: CA125 Results of the tumor marker test revealed 56.1    06/12/2017 Tumor Marker    Patient's tumor was tested for the following markers: CA125 Results of the tumor marker test revealed 49.2    07/10/2017 Tumor Marker    Patient's tumor was tested for the following markers: CA125 Results of the tumor marker test revealed 46.3    08/07/2017 Tumor Marker    Patient's tumor was tested for the following markers: CA125 Results of the tumor marker test revealed 52.9    08/20/2017 Imaging    1. Mild omental/peritoneal haziness, unchanged. No evidence of new metastatic disease. 2. Cirrhosis  with splenomegaly.    09/04/2017 Tumor Marker    Patient's tumor was tested for the following markers: CA125 Results of the tumor marker test revealed 48.5    11/13/2017 Tumor Marker    Patient's tumor was tested for the following markers: CA125 Results of the tumor marker test revealed 55.8       12/18/2017                    Bevacizumab: 10 mg/m  Ferumoxytol: 510 mg IVPB     01/02/2018                  Bevacizumab: 10 mg/m  REVIEW OF SYSTEMS:   Constitutional: Denies fevers, chills or abnormal weight loss Ears, nose, mouth, throat, and face: Denies mucositis or sore throat Respiratory: Denies cough, dyspnea or wheezes Cardiovascular: Denies palpitation, chest discomfort or lower extremity swelling Gastrointestinal:  Denies nausea, heartburn or change in bowel habits Skin: Denies abnormal skin rashes Lymphatics: Denies new lymphadenopathy or easy bruising Neurological:Denies numbness, tingling or new weaknesses Behavioral/Psych: Mood is stable, no new changes  All other systems were reviewed with the patient and are negative.  Past Medical History Reviewed        Family History Reviewed       Social History Reviewed  ALLERGIES:  is allergic to shellfish allergy; benadryl [diphenhydramine hcl]; penicillins; and tape.  MEDICATIONS:  Current Outpatient Medications  Medication Sig Dispense Refill  . bisoprolol (ZEBETA) 10 MG tablet Take 1 tablet (10 mg total) by mouth daily. 30 tablet 11  . cholecalciferol (VITAMIN D) 1000 units tablet Take 1,000 Units by mouth daily.    Marland Kitchen gabapentin (NEURONTIN) 300 MG capsule Take 2 capsules (600 mg total) by mouth 2 (two) times daily. 120 capsule 11  . hydrochlorothiazide (MICROZIDE) 12.5 MG capsule Take 12.5 mg by mouth daily.  1  . HYDROcodone-acetaminophen (NORCO/VICODIN) 5-325 MG tablet take 1 tablet by mouth every 6 hours if needed for MODERATE PAIN 30 tablet 0  . lidocaine-prilocaine (EMLA) cream Apply  to Porta-cath  1-2 hours prior to access as directed. 30 g 9  . metFORMIN (GLUCOPHAGE-XR) 500 MG 24 hr tablet Take 1,000 mg by mouth 2 (two) times daily.   0  . ondansetron (ZOFRAN) 8 MG tablet Take 1 tablet (8 mg total) by mouth every 8 (eight) hours as needed for nausea. 30 tablet 3  . pantoprazole (PROTONIX) 40 MG tablet Take 40 mg by mouth daily.   0  . rotigotine (NEUPRO) 4 MG/24HR Place 1 patch onto the skin daily. 42 patch 0   No current facility-administered medications for this visit.    Facility-Administered Medications Ordered in Other Visits  Medication Dose Route Frequency Provider Last Rate Last Dose  . sodium chloride flush (NS) 0.9 % injection 10 mL  10 mL Intracatheter PRN Henreitta Leber, MD   10 mL at 01/02/18 1640   PHYSICAL EXAMINATION: ECOG PERFORMANCE STATUS: 1 - Symptomatic but completely ambulatory GENERAL:alert, no distress and comfortable SKIN: skin color, texture, turgor are normal, no rashes or significant lesions EYES: normal, Conjunctiva are pink and non-injected, sclera clear OROPHARYNX:no exudate, no erythema and lips, buccal mucosa, and tongue normal  NECK: supple, thyroid normal size, non-tender, without nodularity LYMPH:  no palpable lymphadenopathy in the cervical, axillary or inguinal LUNGS: clear to auscultation and percussion with normal breathing effort HEART: regular rate & rhythm and no murmurs and no lower extremity edema ABDOMEN:abdomen soft, non-tender and normal bowel sounds Musculoskeletal:no cyanosis of digits and no clubbing  NEURO: alert & oriented x 3 with fluent speech, no focal motor/sensory deficits  LABORATORY DATA:  I have reviewed the data as listed January 02, 2018  Ref Range & Units 13:25 2wk ago 60moago 323mogo  WBC 4.0 - 10.5 K/uL 4.2  4.2  4.1 R 4.1 R  RBC 3.87 - 5.11 MIL/uL 4.09  3.89  4.18 R 4.19 R  Hemoglobin 12.0 - 15.0 g/dL 12.2  11.3Low  12.5 R 12.5 R  HCT 36.0 - 46.0 % 38.5  35.9Low   38.1 R 39.3 R  MCV 80.0 -  100.0 fL 94.1  92.3  91.0 R 93.8 R  MCH 26.0 - 34.0 pg 29.8  29.0  29.8 R 29.8 R  MCHC 30.0 - 36.0 g/dL 31.7  31.5  32.7 R 31.8 R  RDW 11.5 - 15.5 % 17.5High   15.9High   16.8High  R 16.1High  R  Platelets 150 - 400 K/uL 80Low   94Low   80Low  R 73Low  R  nRBC 0.0 - 0.2 % 0.0  0.0     Neutrophils Relative % % 59  61  65  66   Neutro Abs 1.7 - 7.7 K/uL 2.5  2.5  2.7 R 2.7 R  Lymphocytes Relative % 33  31  28  27    Lymphs Abs 0.7 - 4.0 K/uL 1.4  1.3  1.2 R 1.1 R  Monocytes Relative % 5  5  5  5    Monocytes Absolute 0.1 - 1.0 K/uL 0.2  0.2  0.2 R 0.2 R  Eosinophils Relative % 2  2  2  2    Eosinophils Absolute 0.0 - 0.5 K/uL 0.1  0.1  0.1  0.1   Basophils Relative % 0  1  0  0   Basophils Absolute 0.0 - 0.1 K/uL 0.0  0.0  0.0 CM 0.0 CM  Immature Granulocytes % 1  0     Abs Immature Granulocytes 0.00 - 0.07 K/uL 0.02  0.01 CM      Ref Range & Units 13:25 2wk ago 75moago 320mogo  Sodium 135 - 145 mmol/L 145  144  143  144   Potassium 3.5 - 5.1 mmol/L 4.2  4.3  4.1  4.0   Chloride 98 - 111 mmol/L 109  110  107  108   CO2 22 - 32 mmol/L 22  24  26  25    Glucose, Bld 70 - 99 mg/dL 133High   99  201High   120High    BUN 8 - 23 mg/dL 17  16  15  19    Creatinine, Ser 0.44 - 1.00 mg/dL 0.91  0.85  0.84  0.83   Calcium 8.9 - 10.3 mg/dL 8.7Low   9.2  8.5Low   8.6Low    Total Protein 6.5 - 8.1 g/dL 7.5  7.5  7.6  7.5   Albumin 3.5 - 5.0 g/dL 3.8  3.8  3.8  3.8   AST 15 - 41 U/L 51High   44High   36  41   ALT 0 - 44 U/L 47High   37  36  39   Alkaline Phosphatase 38 - 126 U/L 124  85  118  117   Total Bilirubin 0.3 - 1.2 mg/dL 0.8  0.7  0.9  0.9   GFR calc non Af Amer >60 mL/min >60  >60  >60  >60   GFR calc Af Amer >60 mL/min >60  >60 CM >60 CM >60 CM  Comment: (NOTE)    December 17, 2017 Ferritin 20 Iron/TIBC 56/421 Iron saturation 13%     Component Value Date/Time   NA 145 01/02/2018 1325   NA 142 02/06/2017 0742   K 4.2 01/02/2018 1325   K 3.9 02/06/2017 0742   CL 109 01/02/2018  1325   CO2 22 01/02/2018 1325   CO2 22 02/06/2017 0742   GLUCOSE 133 (H) 01/02/2018 1325  GLUCOSE 156 (H) 02/06/2017 0742   BUN 17 01/02/2018 1325   BUN 13.5 02/06/2017 0742   CREATININE 0.91 01/02/2018 1325   CREATININE 0.8 02/06/2017 0742   CALCIUM 8.7 (L) 01/02/2018 1325   CALCIUM 8.5 02/06/2017 0742   PROT 7.5 01/02/2018 1325   PROT 6.9 02/06/2017 0742   ALBUMIN 3.8 01/02/2018 1325   ALBUMIN 3.7 02/06/2017 0742   AST 51 (H) 01/02/2018 1325   AST 47 (H) 02/06/2017 0742   ALT 47 (H) 01/02/2018 1325   ALT 54 02/06/2017 0742   ALKPHOS 124 01/02/2018 1325   ALKPHOS 130 02/06/2017 0742   BILITOT 0.8 01/02/2018 1325   BILITOT 0.65 02/06/2017 0742   GFRNONAA >60 01/02/2018 1325   GFRAA >60 01/02/2018 1325   Lab Results  Component Value Date   WBC 4.2 01/02/2018   NEUTROABS 2.5 01/02/2018   HGB 12.2 01/02/2018   HCT 38.5 01/02/2018   MCV 94.1 01/02/2018   PLT 80 (L) 01/02/2018      Chemistry      Component Value Date/Time   NA 145 01/02/2018 1325   NA 142 02/06/2017 0742   K 4.2 01/02/2018 1325   K 3.9 02/06/2017 0742   CL 109 01/02/2018 1325   CO2 22 01/02/2018 1325   CO2 22 02/06/2017 0742   BUN 17 01/02/2018 1325   BUN 13.5 02/06/2017 0742   CREATININE 0.91 01/02/2018 1325   CREATININE 0.8 02/06/2017 0742      Component Value Date/Time   CALCIUM 8.7 (L) 01/02/2018 1325   CALCIUM 8.5 02/06/2017 0742   ALKPHOS 124 01/02/2018 1325   ALKPHOS 130 02/06/2017 0742   AST 51 (H) 01/02/2018 1325   AST 47 (H) 02/06/2017 0742   ALT 47 (H) 01/02/2018 1325   ALT 54 02/06/2017 0742   BILITOT 0.8 01/02/2018 1325   BILITOT 0.65 02/06/2017 0742     The total time spent discussing the her most recent laboratory studies, physical examination, rationale for continued immunotherapy, and recommendations was 25 minutes.  At least 50% of that time was spent in face-to-face discussion, counseling, and answering questions. There was ample time allotted to answer all  questions.  This note was dictated using voice activated technology/software.  Unfortunately, typographical errors are not uncommon, and transcription is subject to mistakes and regrettably misinterpretation.  If necessary, clarification of the above information can be discussed with me at any time.   Henreitta Leber, MD  Hematology/Oncology [facility] [address] [place], [place] [address] Office: [phone] EXHB: [phone]

## 2018-01-15 LAB — TOTAL PROTEIN, URINE DIPSTICK: Protein, ur: NEGATIVE mg/dL

## 2018-01-16 ENCOUNTER — Inpatient Hospital Stay: Payer: Medicare Other | Attending: Hematology and Oncology

## 2018-01-16 ENCOUNTER — Encounter: Payer: Self-pay | Admitting: Hematology and Oncology

## 2018-01-16 ENCOUNTER — Inpatient Hospital Stay: Payer: Medicare Other

## 2018-01-16 ENCOUNTER — Inpatient Hospital Stay (HOSPITAL_BASED_OUTPATIENT_CLINIC_OR_DEPARTMENT_OTHER): Payer: Medicare Other | Admitting: Hematology and Oncology

## 2018-01-16 ENCOUNTER — Ambulatory Visit: Payer: Medicare Other | Admitting: Hematology and Oncology

## 2018-01-16 ENCOUNTER — Telehealth: Payer: Self-pay | Admitting: Hematology and Oncology

## 2018-01-16 VITALS — BP 156/65 | HR 67 | Temp 98.0°F | Resp 16 | Ht 62.0 in | Wt 188.2 lb

## 2018-01-16 VITALS — BP 140/60 | HR 63

## 2018-01-16 DIAGNOSIS — M545 Low back pain, unspecified: Secondary | ICD-10-CM

## 2018-01-16 DIAGNOSIS — K766 Portal hypertension: Secondary | ICD-10-CM | POA: Insufficient documentation

## 2018-01-16 DIAGNOSIS — Z79899 Other long term (current) drug therapy: Secondary | ICD-10-CM

## 2018-01-16 DIAGNOSIS — K7581 Nonalcoholic steatohepatitis (NASH): Secondary | ICD-10-CM | POA: Insufficient documentation

## 2018-01-16 DIAGNOSIS — G2581 Restless legs syndrome: Secondary | ICD-10-CM

## 2018-01-16 DIAGNOSIS — Z170 Estrogen receptor positive status [ER+]: Secondary | ICD-10-CM

## 2018-01-16 DIAGNOSIS — D509 Iron deficiency anemia, unspecified: Secondary | ICD-10-CM

## 2018-01-16 DIAGNOSIS — Z7984 Long term (current) use of oral hypoglycemic drugs: Secondary | ICD-10-CM

## 2018-01-16 DIAGNOSIS — M129 Arthropathy, unspecified: Secondary | ICD-10-CM

## 2018-01-16 DIAGNOSIS — Z79811 Long term (current) use of aromatase inhibitors: Secondary | ICD-10-CM | POA: Diagnosis not present

## 2018-01-16 DIAGNOSIS — K7460 Unspecified cirrhosis of liver: Secondary | ICD-10-CM | POA: Diagnosis not present

## 2018-01-16 DIAGNOSIS — R161 Splenomegaly, not elsewhere classified: Secondary | ICD-10-CM | POA: Diagnosis not present

## 2018-01-16 DIAGNOSIS — C482 Malignant neoplasm of peritoneum, unspecified: Secondary | ICD-10-CM

## 2018-01-16 DIAGNOSIS — D696 Thrombocytopenia, unspecified: Secondary | ICD-10-CM | POA: Insufficient documentation

## 2018-01-16 DIAGNOSIS — R971 Elevated cancer antigen 125 [CA 125]: Secondary | ICD-10-CM

## 2018-01-16 DIAGNOSIS — G8929 Other chronic pain: Secondary | ICD-10-CM

## 2018-01-16 DIAGNOSIS — Z5112 Encounter for antineoplastic immunotherapy: Secondary | ICD-10-CM

## 2018-01-16 DIAGNOSIS — C569 Malignant neoplasm of unspecified ovary: Secondary | ICD-10-CM

## 2018-01-16 LAB — CBC WITH DIFFERENTIAL/PLATELET
Abs Immature Granulocytes: 0.01 10*3/uL (ref 0.00–0.07)
Basophils Absolute: 0 10*3/uL (ref 0.0–0.1)
Basophils Relative: 0 %
Eosinophils Absolute: 0.1 10*3/uL (ref 0.0–0.5)
Eosinophils Relative: 2 %
HCT: 39.5 % (ref 36.0–46.0)
Hemoglobin: 12.3 g/dL (ref 12.0–15.0)
Immature Granulocytes: 0 %
Lymphocytes Relative: 31 %
Lymphs Abs: 1.3 10*3/uL (ref 0.7–4.0)
MCH: 29.6 pg (ref 26.0–34.0)
MCHC: 31.1 g/dL (ref 30.0–36.0)
MCV: 95 fL (ref 80.0–100.0)
Monocytes Absolute: 0.2 10*3/uL (ref 0.1–1.0)
Monocytes Relative: 6 %
Neutro Abs: 2.5 10*3/uL (ref 1.7–7.7)
Neutrophils Relative %: 61 %
Platelets: 77 10*3/uL — ABNORMAL LOW (ref 150–400)
RBC: 4.16 MIL/uL (ref 3.87–5.11)
RDW: 17.3 % — ABNORMAL HIGH (ref 11.5–15.5)
WBC: 4.1 10*3/uL (ref 4.0–10.5)
nRBC: 0 % (ref 0.0–0.2)

## 2018-01-16 LAB — COMPREHENSIVE METABOLIC PANEL
ALT: 47 U/L — ABNORMAL HIGH (ref 0–44)
AST: 50 U/L — ABNORMAL HIGH (ref 15–41)
Albumin: 3.9 g/dL (ref 3.5–5.0)
Alkaline Phosphatase: 134 U/L — ABNORMAL HIGH (ref 38–126)
Anion gap: 12 (ref 5–15)
BUN: 16 mg/dL (ref 8–23)
CO2: 22 mmol/L (ref 22–32)
Calcium: 8.4 mg/dL — ABNORMAL LOW (ref 8.9–10.3)
Chloride: 110 mmol/L (ref 98–111)
Creatinine, Ser: 0.79 mg/dL (ref 0.44–1.00)
GFR calc Af Amer: >60 mL/min (ref >60)
GFR calc non Af Amer: >60 mL/min (ref >60)
Glucose, Bld: 72 mg/dL (ref 70–99)
Potassium: 4 mmol/L (ref 3.5–5.1)
Sodium: 144 mmol/L (ref 135–145)
Total Bilirubin: 0.9 mg/dL (ref 0.3–1.2)
Total Protein: 7.6 g/dL (ref 6.5–8.1)

## 2018-01-16 LAB — TOTAL PROTEIN, URINE DIPSTICK: Protein, ur: NEGATIVE mg/dL

## 2018-01-16 LAB — IRON AND TIBC
Iron: 68 ug/dL (ref 41–142)
Saturation Ratios: 20 % — ABNORMAL LOW (ref 21–57)
TIBC: 341 ug/dL (ref 236–444)
UIBC: 273 ug/dL (ref 120–384)

## 2018-01-16 LAB — FERRITIN: Ferritin: 99 ng/mL (ref 11–307)

## 2018-01-16 MED ORDER — SODIUM CHLORIDE 0.9 % IV SOLN
10.2000 mg/kg | Freq: Once | INTRAVENOUS | Status: AC
  Administered 2018-01-16: 900 mg via INTRAVENOUS
  Filled 2018-01-16: qty 32

## 2018-01-16 MED ORDER — SODIUM CHLORIDE 0.9 % IV SOLN
Freq: Once | INTRAVENOUS | Status: AC
  Administered 2018-01-16: 14:00:00 via INTRAVENOUS
  Filled 2018-01-16: qty 250

## 2018-01-16 MED ORDER — HYDROCODONE-ACETAMINOPHEN 5-325 MG PO TABS: ORAL_TABLET | ORAL | 0 refills | Status: DC

## 2018-01-16 MED ORDER — HEPARIN SOD (PORK) LOCK FLUSH 100 UNIT/ML IV SOLN
500.0000 [IU] | Freq: Once | INTRAVENOUS | Status: AC | PRN
  Administered 2018-01-16: 500 [IU]
  Filled 2018-01-16: qty 5

## 2018-01-16 MED ORDER — SODIUM CHLORIDE 0.9% FLUSH
10.0000 mL | INTRAVENOUS | Status: DC | PRN
  Administered 2018-01-16: 10 mL
  Filled 2018-01-16: qty 10

## 2018-01-16 NOTE — Patient Instructions (Signed)
Implanted Port Home Guide An implanted port is a type of central line that is placed under the skin. Central lines are used to provide IV access when treatment or nutrition needs to be given through a person's veins. Implanted ports are used for long-term IV access. An implanted port may be placed because:  You need IV medicine that would be irritating to the small veins in your hands or arms.  You need long-term IV medicines, such as antibiotics.  You need IV nutrition for a long period.  You need frequent blood draws for lab tests.  You need dialysis.  Implanted ports are usually placed in the chest area, but they can also be placed in the upper arm, the abdomen, or the leg. An implanted port has two main parts:  Reservoir. The reservoir is round and will appear as a small, raised area under your skin. The reservoir is the part where a needle is inserted to give medicines or draw blood.  Catheter. The catheter is a thin, flexible tube that extends from the reservoir. The catheter is placed into a large vein. Medicine that is inserted into the reservoir goes into the catheter and then into the vein.  How will I care for my incision site? Do not get the incision site wet. Bathe or shower as directed by your health care provider. How is my port accessed? Special steps must be taken to access the port:  Before the port is accessed, a numbing cream can be placed on the skin. This helps numb the skin over the port site.  Your health care provider uses a sterile technique to access the port. ? Your health care provider must put on a mask and sterile gloves. ? The skin over your port is cleaned carefully with an antiseptic and allowed to dry. ? The port is gently pinched between sterile gloves, and a needle is inserted into the port.  Only "non-coring" port needles should be used to access the port. Once the port is accessed, a blood return should be checked. This helps ensure that the port  is in the vein and is not clogged.  If your port needs to remain accessed for a constant infusion, a clear (transparent) bandage will be placed over the needle site. The bandage and needle will need to be changed every week, or as directed by your health care provider.  Keep the bandage covering the needle clean and dry. Do not get it wet. Follow your health care provider's instructions on how to take a shower or bath while the port is accessed.  If your port does not need to stay accessed, no bandage is needed over the port.  What is flushing? Flushing helps keep the port from getting clogged. Follow your health care provider's instructions on how and when to flush the port. Ports are usually flushed with saline solution or a medicine called heparin. The need for flushing will depend on how the port is used.  If the port is used for intermittent medicines or blood draws, the port will need to be flushed: ? After medicines have been given. ? After blood has been drawn. ? As part of routine maintenance.  If a constant infusion is running, the port may not need to be flushed.  How long will my port stay implanted? The port can stay in for as long as your health care provider thinks it is needed. When it is time for the port to come out, surgery will be   done to remove it. The procedure is similar to the one performed when the port was put in. When should I seek immediate medical care? When you have an implanted port, you should seek immediate medical care if:  You notice a bad smell coming from the incision site.  You have swelling, redness, or drainage at the incision site.  You have more swelling or pain at the port site or the surrounding area.  You have a fever that is not controlled with medicine.  This information is not intended to replace advice given to you by your health care provider. Make sure you discuss any questions you have with your health care provider. Document  Released: 01/28/2005 Document Revised: 07/06/2015 Document Reviewed: 10/05/2012 [employer] Interactive Patient Education  2017 [employer]  

## 2018-01-16 NOTE — Assessment & Plan Note (Signed)
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 She is not symptomatic.  Recommend close observation only

## 2018-01-16 NOTE — Assessment & Plan Note (Signed)
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

## 2018-01-16 NOTE — Assessment & Plan Note (Signed)
The patient have chronic joint pain and arthritis He is prescribed gabapentin along with hydrocodone as needed We will continue the same and I have refilled her prescriptions

## 2018-01-16 NOTE — Telephone Encounter (Signed)
Gave avs and calendar ° °

## 2018-01-16 NOTE — Assessment & Plan Note (Signed)
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

## 2018-01-16 NOTE — Progress Notes (Signed)
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 She is not symptomatic.  Recommend close observation only  Low back pain The patient have chronic joint pain and arthritis He is prescribed gabapentin along with hydrocodone as needed We will continue the same and I have refilled her prescriptions   Orders Placed This Encounter  Procedures  . Iron and TIBC    Standing Status:   Standing    Number of Occurrences:   9    Standing Expiration Date:   01/17/2019  . Ferritin    Standing Status:   Standing    Number of Occurrences:   9    Standing Expiration Date:   01/17/2019    INTERVAL HISTORY: Please see below for problem oriented charting. She returns for further follow-up and resumption of treatment after recent cataract surgery She feels well She continues to have intermittent chronic pain in her back, stable The patient denies any recent signs or symptoms of bleeding such as spontaneous epistaxis, hematuria or hematochezia. 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  No worrisome  liver mass.  There are associated portal venous collaterals and splenomegaly consistent with portal venous hypertension. 2.  No other significant upper abdominal findings    11/08/2013 Imaging    US showed ascites    11/08/2013 Initial Diagnosis    Patient had ~ 2 months of some urinary incontinence, saw Dr Ronita Hipps in 10-2013, with pelvic mass on exam     11/12/2013 Imaging    There are findings of progressive hepatic cirrhosis with a large amount of ascites and mild splenomegaly. There are venous collaterals present. 2. There is abnormal thickening of the peritoneal surface along the left mid and lower abdominal wall. There is abnormal soft tissue density in the pelvis as well which could reflect the clinically suspected ovarian malignancy but the findings are nondiagnostic. 3. There is a new small left pleural effusion. 4. There is no acute bowel abnormality.    11/18/2013 Imaging    Successful ultrasound guided diagnostic and therapeutic paracentesis yielding 4.1 liters of ascites.     11/18/2013 Pathology Results    PERITONEAL/ASCITIC FLUID (SPECIMEN 1 OF 1 COLLECTED 11/18/13): SEROUS CARCINOMA, PLEASE SEE COMMENT.    12/01/2013 Tumor Marker    Patient's tumor was tested for the following markers: CA125 Results of the tumor marker test revealed 1938    12/03/2013 Imaging    Successful ultrasound-guided therapeutic paracentesis yielding 3.6 liters of peritoneal fluid.    12/07/2013 - 05/30/2014 Chemotherapy    She received 3 cycles of carboplatin and Taxol, interrupted cycle 4 for surgery.  She subsequently completed 3 more cycles of chemotherapy after surgery  12/20/2013 Imaging    Successful ultrasound-guided diagnostic and therapeutic paracentesis yielding 2.2 liters of peritoneal fluid    12/20/2013 Pathology Results    PERITONEAL/ASCITIC FLUID(SPECIMEN 1 OF 1 COLLECTED 12/20/13): MALIGNANT CELLS CONSISTENT WITH SEROUS CARCINOMA    01/13/2014 Tumor Marker    Patient's tumor was  tested for the following markers: CA125 Results of the tumor marker test revealed 227    02/07/2014 Tumor Marker    Patient's tumor was tested for the following markers: CA125 Results of the tumor marker test revealed 130    02/15/2014 Genetic Testing    Genetics testing from 02-2014 normal ([employer] panel)    02/23/2014 Imaging    Interval decrease in ascites. 2. Morphologic changes in the liver consistent with cirrhosis. 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 Portal vein remains patent at this time. 3. Persistent but decreased abnormal soft tissue attenuation tracking in the omental fat. This may be secondary to interval improvement in metastatic disease. 4. Peritoneal thickening seen in the left abdomen and pelvis on the previous study has decreased and nearly resolved in the interval. This also suggests interval improvement in metastatic disease.     03/07/2014 Procedure    Ultrasound and fluoroscopically guided right internal jugular single lumen power port catheter insertion. Tip in the SVC/RA junction. Catheter ready for use.    03/17/2014 Tumor Marker    Patient's tumor was tested for the following markers: CA125 Results of the tumor marker test revealed 45    03/29/2014 Pathology Results    1. Omentum, resection for tumor - HIGH GRADE SEROUS CARCINOMA, SEE COMMENT. 2. Ovary and fallopian tube, right - HIGH GRADE SEROUS CARCINOMA, SEE COMMENT. 3. Ovary and fallopian tube, left - HIGH GRADE SEROUS CARCINOMA, SEE COMMENT. Diagnosis Note 1. Nests and clusters of malignant cells are invading the omental tissue (part #1) with associated fibrosis. The cells are pleomorphic with prominent nucleoli. There are scattered psammoma bodies. The ovaries are atrophic and exhibit multiple foci of surface based invasive carcinoma and associated fibrosis. The fallopian tubes have a few foci of carcinoma, also superficially located. There are no precursor lesions  noted in the ovaries or fallopian tubes (the entire tubes and ovaries were submitted for evaluation). While there is some retraction artifact, there are several foci suspicious for lymphovascular invasion. 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 Given the fibrosis, there does appear to be a small amount of treatment effect, however, there is abundant residual tumor.     03/29/2014 Surgery    Procedure(s) Performed: 1. Exploratory laparotomy with bilateral salpingo-oophorectomy, omentectomy radical tumor debulking for ovarian cancer .  Surgeon: Thereasa Solo, MD.  Assistant Surgeon: Lahoma Crocker, M.D. Assistant: (an MD assistant was necessary for tissue manipulation, retraction and positioning due to the complexity of the case and hospital policies).  Operative Findings: 10cm omental cake from hepatic to splenic flexure, densely adherent to transverse colon. Milliary studding of tumor implants (<4m, too numerous in number to count) adherent to the mesentery of the small bowel and small bowel wall. Small volume (100cc) ascites. Small ovaries bilaterally, densely adherent to the pelvic cul de sac. Left ovary and tube densely adherent to sigmoid colon. Cirrhotic liver with hepatomegaly and splenomegaly.     This represented an optimal cytoreduction (R1) with visible disease residual on the bowel wall and mesentery (millial, <391mimplants).  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 2. 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     08/18/2014 Tumor Marker    Patient's tumor was tested for the following markers: CA125 Results of the tumor marker test revealed 16    10/13/2014 Tumor Marker    Patient's tumor was tested for the following markers: CA125 Results of the tumor marker test revealed 14    11/21/2014 Tumor Marker    Patient's tumor was tested for the following markers: CA125 Results of the tumor marker test revealed 16    12/28/2014 Tumor Marker    Patient's tumor was tested for the following markers: CA125 Results of the tumor marker test revealed 762    02/27/2015 Tumor Marker    Patient's tumor was tested for the following markers: CA125 Results of the tumor marker test revealed 20.2    03/22/2015 Imaging    Filler appearance to the prior exam, with very subtle fluid tracking along portions of the liver, and very minimal nodularity along the right paracolic gutter representing residua from the prior peritoneal cancer. The current abnormalities could simply be post therapy findings rather than necessarily representing residual malignancy. No new or enlarging lesions are identified. 2. Hepatic cirrhosis and splenomegaly. There is some gastric varices suggesting portal venous hypertension. 3. Left foraminal stenosis at L4-5 due to spurring. There is likely also some central narrowing of the thecal sac at this level. 4. Bibasilar scarring. 5. Chronic mild left mid kidney scarring    03/27/2015 Tumor Marker    Patient's tumor was tested for the following markers: CA125 Results of the tumor marker test revealed 25.3    04/24/2015 Imaging    Disc bulge L2-3 with mild spinal stenosis and right foraminal encroachment. 2. Disc bulge L3-4 with mild spinal stenosis. 3. Multifactorial spinal, left lateral recess and foraminal stenosis L4-5. There is  grade 1 anterolisthesis without evident dynamic instability.    05/29/2015 Tumor Marker    Patient's tumor was tested for the following markers: CA125 Results of the tumor marker test revealed 29.9    08/21/2015 Tumor Marker    Patient's tumor was tested for the following markers: CA125 Results of the tumor marker test revealed 45    09/18/2015 Tumor Marker    Patient's tumor was tested for the following markers: CA125 Results of the tumor marker test revealed 47    09/27/2015 Imaging    New small bowel mesenteric nodule and enlarged left external iliac lymph node, highly worrisome for metastatic disease. 2. Cirrhosis and splenomegaly    10/10/2015 Tumor Marker    Patient's tumor was tested for the following markers: CA125 Results of the tumor marker test revealed 53.4    10/10/2015 - 12/11/2015 Chemotherapy    She received 4 cycles of carboplatin only    11/20/2015 Tumor Marker    Patient's tumor was tested for the following markers: CA125 Results of the tumor marker test revealed 41.3    12/20/2015 Imaging  CT: Mild left external iliac lymphadenopathy is slightly decreased. Mildly enlarged lower mesenteric nodule is slightly decreased. 2. No new or progressive metastatic disease in the abdomen or pelvis. 3. Cirrhosis. Stable subcentimeter hypodense left liver lobe lesion. No new liver lesions. 4. Stable mild splenomegaly.  No ascites. 5. Mild sigmoid diverticulosis.    01/01/2016 -  Anti-estrogen oral therapy    She was placed on tamoxifen. Had letrozole initially but switched to tamoxifen due to poor tolerance    01/15/2016 Tumor Marker    Patient's tumor was tested for the following markers: CA125 Results of the tumor marker test revealed 31.4    02/19/2016 Tumor Marker    Patient's tumor was tested for the following markers: CA125 Results of the tumor marker test revealed 36.5    05/13/2016 Imaging    No evidence of disease progression within the abdomen or pelvis. Previously  noted small central mesenteric nodule and left external iliac node appear slightly smaller. 2. Stable changes of hepatic cirrhosis and portal hypertension with associated splenomegaly. No new or enlarging hepatic lesions are identified. 3. No acute findings.    05/13/2016 Tumor Marker    Patient's tumor was tested for the following markers: CA125 Results of the tumor marker test revealed 41.2    06/24/2016 Tumor Marker    Patient's tumor was tested for the following markers: CA125 Results of the tumor marker test revealed 47.3    08/20/2016 Imaging    1. The left external iliac lymph node is slightly increased in size compared to the prior exam, currently 1.1 cm and previously 0.9 cm in diameter. 2. Stable central mesenteric lymph node at 0.9 cm in diameter. 3. Stable trace free pelvic fluid and stable slight thickening along the right paracolic gutter, without well-defined peritoneal nodularity. 4. Other imaging findings of potential clinical significance: Subsegmental atelectasis or scarring in the lung bases. Hepatic cirrhosis. Left renal scarring. Mild splenomegaly. Sigmoid colon diverticulosis. Impingement at L4-5 due to spondylosis and degenerative disc disease broad Schmorl' s nodes at L3-L4. Pelvic floor laxity    08/23/2016 Tumor Marker    Patient's tumor was tested for the following markers: CA125 Results of the tumor marker test revealed 80.2    09/04/2016 Imaging    LV EF: 60% -   65%    09/12/2016 Tumor Marker    Patient's tumor was tested for the following markers: CA125 Results of the tumor marker test revealed 98.5    09/12/2016 -  Chemotherapy    The patient received Doxil and Avastin.     09/26/2016 Tumor Marker    Patient's tumor was tested for the following markers: CA125 Results of the tumor marker test revealed 68.9    10/10/2016 Tumor Marker    Patient's tumor was tested for the following markers: CA125 Results of the tumor marker test revealed 65.6    11/07/2016 Tumor  Marker    Patient's tumor was tested for the following markers: CA125 Results of the tumor marker test revealed 46.4    11/29/2016 Imaging    Stable mild peritoneal thickening, suspicious for peritoneal carcinomatosis. No new or progressive disease identified. No evidence of ascites.  No significant change in 10 mm left external iliac and 8 mm mesenteric lymph nodes.  Stable hepatic cirrhosis, and splenomegaly consistent with portal venous hypertension.  Colonic diverticulosis, without radiographic evidence of diverticulitis.    12/02/2016 Imaging    Normal LV size with EF 55%. Basal inferior and basal inferoseptal hypokinesis. Normal RV size and systolic  function. No significant valvular abnormalities.    12/05/2016 Tumor Marker    Patient's tumor was tested for the following markers: CA125 Results of the tumor marker test revealed 49.1    01/09/2017 Tumor Marker    Patient's tumor was tested for the following markers: CA125 Results of the tumor marker test revealed 47.2    02/06/2017 Tumor Marker    Patient's tumor was tested for the following markers: CA125 Results of the tumor marker test revealed 44.1    02/18/2017 Imaging    Stable mild peritoneal thickening. No new or progressive disease identified within the abdomen or pelvis.  Stable tiny sub-cm left external iliac and mesenteric lymph nodes.  Stable hepatic cirrhosis, and splenomegaly consistent with portal venous hypertension. No evidence of hepatic neoplasm.  Colonic diverticulosis, without radiographic evidence of diverticulitis.    03/06/2017 Tumor Marker    Patient's tumor was tested for the following markers: CA125 Results of the tumor marker test revealed 50.4    03/17/2017 Imaging    - Left ventricle: The cavity size was normal. Wall thickness was normal. Systolic function was normal. The estimated ejection fraction was in the range of 55% to 60%. Wall motion was normal; there were no regional wall motion  abnormalities. Features are consistent with a pseudonormal left ventricular filling pattern, with concomitant abnormal relaxation and increased filling pressure (grade 2 diastolic dysfunction). - Mitral valve: There was mild regurgitation. - Left atrium: The atrium was mildly dilated    04/03/2017 Tumor Marker    Patient's tumor was tested for the following markers: CA125 Results of the tumor marker test revealed 47.2    05/14/2017 Imaging    CT scan of abdomen and pelvis Stable mild peritoneal thickening. No new or progressive disease within the abdomen or pelvis.  Stable hepatic cirrhosis. Stable splenomegaly, consistent with portal venous hypertension. No evidence of hepatic neoplasm.    05/15/2017 Tumor Marker    Patient's tumor was tested for the following markers: CA125 Results of the tumor marker test revealed 56.1    06/12/2017 Tumor Marker    Patient's tumor was tested for the following markers: CA125 Results of the tumor marker test revealed 49.2    07/10/2017 Tumor Marker    Patient's tumor was tested for the following markers: CA125 Results of the tumor marker test revealed 46.3    08/07/2017 Tumor Marker    Patient's tumor was tested for the following markers: CA125 Results of the tumor marker test revealed 52.9    08/20/2017 Imaging    1. Mild omental/peritoneal haziness, unchanged. No evidence of new metastatic disease. 2. Cirrhosis with splenomegaly.    09/04/2017 Tumor Marker    Patient's tumor was tested for the following markers: CA125 Results of the tumor marker test revealed 48.5    11/13/2017 Tumor Marker    Patient's tumor was tested for the following markers: CA125 Results of the tumor marker test revealed 55.8    12/17/2017 Imaging    1. No findings to suggest metastatic disease in the abdomen or pelvis. 2. Severe hepatic cirrhosis with evidence of portal hypertension, as demonstrated by dilated portal vein, splenomegaly and portosystemic collateral pathways,  including gastric and esophageal varices.  3. Colonic diverticulosis without evidence of acute diverticulitis at this time. 4. Additional incidental findings, as above.     REVIEW OF SYSTEMS:   Constitutional: Denies fevers, chills or abnormal weight loss Eyes: Denies blurriness of vision Ears, nose, mouth, throat, and face: Denies mucositis or sore throat Respiratory: Denies  cough, dyspnea or wheezes Cardiovascular: Denies palpitation, chest discomfort or lower extremity swelling Gastrointestinal:  Denies nausea, heartburn or change in bowel habits Skin: Denies abnormal skin rashes Lymphatics: Denies new lymphadenopathy or easy bruising Neurological:Denies numbness, tingling or new weaknesses Behavioral/Psych: Mood is stable, no new changes  All other systems were reviewed with the patient and are negative.  I have reviewed the past medical history, past surgical history, social history and family history with the patient and they are unchanged from previous note.  ALLERGIES:  is allergic to shellfish allergy; benadryl [diphenhydramine hcl]; penicillins; and tape.  MEDICATIONS:  Current Outpatient Medications  Medication Sig Dispense Refill  . bisoprolol (ZEBETA) 10 MG tablet Take 1 tablet (10 mg total) by mouth daily. 30 tablet 11  . cholecalciferol (VITAMIN D) 1000 units tablet Take 1,000 Units by mouth daily.    Marland Kitchen gabapentin (NEURONTIN) 300 MG capsule Take 2 capsules (600 mg total) by mouth 2 (two) times daily. 120 capsule 11  . hydrochlorothiazide (MICROZIDE) 12.5 MG capsule Take 12.5 mg by mouth daily.  1  . HYDROcodone-acetaminophen (NORCO/VICODIN) 5-325 MG tablet take 1 tablet by mouth every 6 hours if needed for MODERATE PAIN 60 tablet 0  . lidocaine-prilocaine (EMLA) cream Apply to Porta-cath  1-2 hours prior to access as directed. 30 g 9  . metFORMIN (GLUCOPHAGE-XR) 500 MG 24 hr tablet Take 1,000 mg by mouth 2 (two) times daily.   0  . ondansetron (ZOFRAN) 8 MG tablet Take  1 tablet (8 mg total) by mouth every 8 (eight) hours as needed for nausea. 30 tablet 3  . pantoprazole (PROTONIX) 40 MG tablet Take 40 mg by mouth daily.   0  . rotigotine (NEUPRO) 4 MG/24HR Place 1 patch onto the skin daily. 42 patch 0   No current facility-administered medications for this visit.    Facility-Administered Medications Ordered in Other Visits  Medication Dose Route Frequency Provider Last Rate Last Dose  . heparin lock flush 100 unit/mL  500 Units Intracatheter Once PRN Alvy Bimler, Joshua Zeringue, MD      . sodium chloride flush (NS) 0.9 % injection 10 mL  10 mL Intracatheter PRN Alvy Bimler, Jaeanna Mccomber, MD        PHYSICAL EXAMINATION: ECOG PERFORMANCE STATUS: 1 - Symptomatic but completely ambulatory  Vitals:   01/16/18 1244  BP: (!) 156/65  Pulse: 67  Resp: 16  Temp: 98 F (36.7 C)  SpO2: 98%   Filed Weights   01/16/18 1244  Weight: 188 lb 3.2 oz (85.4 kg)    GENERAL:alert, no distress and comfortable SKIN: skin color, texture, turgor are normal, no rashes or significant lesions EYES: normal, Conjunctiva are pink and non-injected, sclera clear OROPHARYNX:no exudate, no erythema and lips, buccal mucosa, and tongue normal  NECK: supple, thyroid normal size, non-tender, without nodularity LYMPH:  no palpable lymphadenopathy in the cervical, axillary or inguinal LUNGS: clear to auscultation and percussion with normal breathing effort HEART: regular rate & rhythm and no murmurs and no lower extremity edema ABDOMEN:abdomen soft, non-tender and normal bowel sounds Musculoskeletal:no cyanosis of digits and no clubbing  NEURO: alert & oriented x 3 with fluent speech, no focal motor/sensory deficits  LABORATORY DATA:  I have reviewed the data as listed    Component Value Date/Time   NA 144 01/16/2018 1134   NA 142 02/06/2017 0742   K 4.0 01/16/2018 1134   K 3.9 02/06/2017 0742   CL 110 01/16/2018 1134   CO2 22 01/16/2018 1134   CO2 22 02/06/2017  0742   GLUCOSE 72 01/16/2018 1134    GLUCOSE 156 (H) 02/06/2017 0742   BUN 16 01/16/2018 1134   BUN 13.5 02/06/2017 0742   CREATININE 0.79 01/16/2018 1134   CREATININE 0.8 02/06/2017 0742   CALCIUM 8.4 (L) 01/16/2018 1134   CALCIUM 8.5 02/06/2017 0742   PROT 7.6 01/16/2018 1134   PROT 6.9 02/06/2017 0742   ALBUMIN 3.9 01/16/2018 1134   ALBUMIN 3.7 02/06/2017 0742   AST 50 (H) 01/16/2018 1134   AST 47 (H) 02/06/2017 0742   ALT 47 (H) 01/16/2018 1134   ALT 54 02/06/2017 0742   ALKPHOS 134 (H) 01/16/2018 1134   ALKPHOS 130 02/06/2017 0742   BILITOT 0.9 01/16/2018 1134   BILITOT 0.65 02/06/2017 0742   GFRNONAA >60 01/16/2018 1134   GFRAA >60 01/16/2018 1134    No results found for: SPEP, UPEP  Lab Results  Component Value Date   WBC 4.1 01/16/2018   NEUTROABS 2.5 01/16/2018   HGB 12.3 01/16/2018   HCT 39.5 01/16/2018   MCV 95.0 01/16/2018   PLT 77 (L) 01/16/2018      Chemistry      Component Value Date/Time   NA 144 01/16/2018 1134   NA 142 02/06/2017 0742   K 4.0 01/16/2018 1134   K 3.9 02/06/2017 0742   CL 110 01/16/2018 1134   CO2 22 01/16/2018 1134   CO2 22 02/06/2017 0742   BUN 16 01/16/2018 1134   BUN 13.5 02/06/2017 0742   CREATININE 0.79 01/16/2018 1134   CREATININE 0.8 02/06/2017 0742      Component Value Date/Time   CALCIUM 8.4 (L) 01/16/2018 1134   CALCIUM 8.5 02/06/2017 0742   ALKPHOS 134 (H) 01/16/2018 1134   ALKPHOS 130 02/06/2017 0742   AST 50 (H) 01/16/2018 1134   AST 47 (H) 02/06/2017 0742   ALT 47 (H) 01/16/2018 1134   ALT 54 02/06/2017 0742   BILITOT 0.9 01/16/2018 1134   BILITOT 0.65 02/06/2017 0742      All questions were answered. The patient knows to call the clinic with any problems, questions or concerns. No barriers to learning was detected.  I spent 15 minutes counseling the patient face to face. The total time spent in the appointment was 20 minutes and more than 50% was on counseling and review of test results  [name], MD 01/16/2018 2:50 PM

## 2018-01-17 LAB — CA 125: Cancer Antigen (CA) 125: 63.7 U/mL — ABNORMAL HIGH (ref 0.0–38.1)

## 2018-01-29 ENCOUNTER — Inpatient Hospital Stay: Payer: Medicare Other

## 2018-01-29 ENCOUNTER — Other Ambulatory Visit: Payer: Self-pay | Admitting: Hematology and Oncology

## 2018-01-29 VITALS — BP 129/57 | HR 62 | Temp 97.1°F | Resp 16

## 2018-01-29 DIAGNOSIS — C482 Malignant neoplasm of peritoneum, unspecified: Secondary | ICD-10-CM

## 2018-01-29 DIAGNOSIS — C569 Malignant neoplasm of unspecified ovary: Secondary | ICD-10-CM

## 2018-01-29 DIAGNOSIS — D509 Iron deficiency anemia, unspecified: Secondary | ICD-10-CM

## 2018-01-29 DIAGNOSIS — Z5112 Encounter for antineoplastic immunotherapy: Secondary | ICD-10-CM | POA: Diagnosis not present

## 2018-01-29 LAB — COMPREHENSIVE METABOLIC PANEL
ALT: 35 U/L (ref 0–44)
AST: 36 U/L (ref 15–41)
Albumin: 3.9 g/dL (ref 3.5–5.0)
Alkaline Phosphatase: 96 U/L (ref 38–126)
Anion gap: 11 (ref 5–15)
BUN: 14 mg/dL (ref 8–23)
CO2: 23 mmol/L (ref 22–32)
Calcium: 9 mg/dL (ref 8.9–10.3)
Chloride: 109 mmol/L (ref 98–111)
Creatinine, Ser: 0.83 mg/dL (ref 0.44–1.00)
GFR calc Af Amer: >60 mL/min (ref >60)
GFR calc non Af Amer: >60 mL/min (ref >60)
Glucose, Bld: 99 mg/dL (ref 70–99)
Potassium: 4.2 mmol/L (ref 3.5–5.1)
Sodium: 143 mmol/L (ref 135–145)
Total Bilirubin: 1 mg/dL (ref 0.3–1.2)
Total Protein: 7.5 g/dL (ref 6.5–8.1)

## 2018-01-29 LAB — CBC WITH DIFFERENTIAL/PLATELET
Abs Immature Granulocytes: 0.01 10*3/uL (ref 0.00–0.07)
Basophils Absolute: 0 10*3/uL (ref 0.0–0.1)
Basophils Relative: 0 %
Eosinophils Absolute: 0.1 10*3/uL (ref 0.0–0.5)
Eosinophils Relative: 2 %
HCT: 36.6 % (ref 36.0–46.0)
Hemoglobin: 11.6 g/dL — ABNORMAL LOW (ref 12.0–15.0)
Immature Granulocytes: 0 %
Lymphocytes Relative: 30 %
Lymphs Abs: 1.4 10*3/uL (ref 0.7–4.0)
MCH: 29.9 pg (ref 26.0–34.0)
MCHC: 31.7 g/dL (ref 30.0–36.0)
MCV: 94.3 fL (ref 80.0–100.0)
Monocytes Absolute: 0.2 10*3/uL (ref 0.1–1.0)
Monocytes Relative: 4 %
Neutro Abs: 2.9 10*3/uL (ref 1.7–7.7)
Neutrophils Relative %: 64 %
Platelets: 89 10*3/uL — ABNORMAL LOW (ref 150–400)
RBC: 3.88 MIL/uL (ref 3.87–5.11)
RDW: 17 % — ABNORMAL HIGH (ref 11.5–15.5)
WBC: 4.6 10*3/uL (ref 4.0–10.5)
nRBC: 0 % (ref 0.0–0.2)

## 2018-01-29 LAB — FERRITIN: Ferritin: 38 ng/mL (ref 11–307)

## 2018-01-29 LAB — IRON AND TIBC
Iron: 68 ug/dL (ref 41–142)
Saturation Ratios: 18 % — ABNORMAL LOW (ref 21–57)
TIBC: 370 ug/dL (ref 236–444)
UIBC: 302 ug/dL (ref 120–384)

## 2018-01-29 MED ORDER — SODIUM CHLORIDE 0.9 % IV SOLN
Freq: Once | INTRAVENOUS | Status: AC
  Administered 2018-01-29: 10:00:00 via INTRAVENOUS
  Filled 2018-01-29: qty 250

## 2018-01-29 MED ORDER — HEPARIN SOD (PORK) LOCK FLUSH 100 UNIT/ML IV SOLN
500.0000 [IU] | Freq: Once | INTRAVENOUS | Status: AC | PRN
  Administered 2018-01-29: 500 [IU]
  Filled 2018-01-29: qty 5

## 2018-01-29 MED ORDER — SODIUM CHLORIDE 0.9 % IV SOLN
Freq: Once | INTRAVENOUS | Status: AC
  Filled 2018-01-29: qty 250

## 2018-01-29 MED ORDER — SODIUM CHLORIDE 0.9 % IV SOLN
10.3000 mg/kg | Freq: Once | INTRAVENOUS | Status: AC
  Administered 2018-01-29: 900 mg via INTRAVENOUS
  Filled 2018-01-29: qty 32

## 2018-01-29 MED ORDER — SODIUM CHLORIDE 0.9% FLUSH
10.0000 mL | Freq: Once | INTRAVENOUS | Status: AC
  Administered 2018-01-29: 10 mL
  Filled 2018-01-29: qty 10

## 2018-01-29 MED ORDER — SODIUM CHLORIDE 0.9% FLUSH
10.0000 mL | INTRAVENOUS | Status: DC | PRN
  Administered 2018-01-29: 10 mL
  Filled 2018-01-29: qty 10

## 2018-01-29 NOTE — Patient Instructions (Signed)
[facility] [facility] Discharge Instructions for Patients Receiving Chemotherapy  Today you received the following chemotherapy agents Bevacizumab (Avastin).  To help prevent nausea and vomiting after your treatment, we encourage you to take your nausea medication as prescribed.   If you develop nausea and vomiting that is not controlled by your nausea medication, call the clinic.   BELOW ARE SYMPTOMS THAT SHOULD BE REPORTED IMMEDIATELY:  *FEVER GREATER THAN 100.5 F  *CHILLS WITH OR WITHOUT FEVER  NAUSEA AND VOMITING THAT IS NOT CONTROLLED WITH YOUR NAUSEA MEDICATION  *UNUSUAL SHORTNESS OF BREATH  *UNUSUAL BRUISING OR BLEEDING  TENDERNESS IN MOUTH AND THROAT WITH OR WITHOUT PRESENCE OF ULCERS  *URINARY PROBLEMS  *BOWEL PROBLEMS  UNUSUAL RASH Items with * indicate a potential emergency and should be followed up as soon as possible.  Feel free to call the clinic should you have any questions or concerns. The clinic phone number is [phone].  Please show the [place] at check-in to the Emergency Department and triage nurse.  Ferumoxytol injection What is this medicine? FERUMOXYTOL is an iron complex. Iron is used to make healthy red blood cells, which carry oxygen and nutrients throughout the body. This medicine is used to treat iron deficiency anemia in people with chronic kidney disease. This medicine may be used for other purposes; ask your health care provider or pharmacist if you have questions. COMMON BRAND NAME(S): Feraheme What should I tell my health care provider before I take this medicine? They need to know if you have any of these conditions: -anemia not caused by low iron levels -high levels of iron in the blood -magnetic resonance imaging (MRI) test scheduled -an unusual or allergic reaction to iron, other medicines, foods, dyes, or preservatives -pregnant or trying to get pregnant -breast-feeding How should I use this medicine? This  medicine is for injection into a vein. It is given by a health care professional in a hospital or clinic setting. Talk to your pediatrician regarding the use of this medicine in children. Special care may be needed. Overdosage: If you think you have taken too much of this medicine contact a poison control center or emergency room at once. NOTE: This medicine is only for you. Do not share this medicine with others. What if I miss a dose? It is important not to miss your dose. Call your doctor or health care professional if you are unable to keep an appointment. What may interact with this medicine? This medicine may interact with the following medications: -other iron products This list may not describe all possible interactions. Give your health care provider a list of all the medicines, herbs, non-prescription drugs, or dietary supplements you use. Also tell them if you smoke, drink alcohol, or use illegal drugs. Some items may interact with your medicine. What should I watch for while using this medicine? Visit your doctor or healthcare professional regularly. Tell your doctor or healthcare professional if your symptoms do not start to get better or if they get worse. You may need blood work done while you are taking this medicine. You may need to follow a special diet. Talk to your doctor. Foods that contain iron include: whole grains/cereals, dried fruits, beans, or peas, leafy green vegetables, and organ meats (liver, kidney). What side effects may I notice from receiving this medicine? Side effects that you should report to your doctor or health care professional as soon as possible: -allergic reactions like skin rash, itching or hives, swelling of the  face, lips, or tongue -breathing problems -changes in blood pressure -feeling faint or lightheaded, falls -fever or chills -flushing, sweating, or hot feelings -swelling of the ankles or feet Side effects that usually do not require medical  attention (report to your doctor or health care professional if they continue or are bothersome): -diarrhea -headache -nausea, vomiting -stomach pain This list may not describe all possible side effects. Call your doctor for medical advice about side effects. You may report side effects to FDA at 1-800-FDA-1088. Where should I keep my medicine? This drug is given in a hospital or clinic and will not be stored at home. NOTE: This sheet is a summary. It may not cover all possible information. If you have questions about this medicine, talk to your doctor, pharmacist, or health care provider.  2018 Elsevier/Gold Standard (2015-03-02 12:41:49)

## 2018-02-09 ENCOUNTER — Inpatient Hospital Stay (HOSPITAL_COMMUNITY)
Admission: EM | Admit: 2018-02-09 | Discharge: 2018-02-12 | DRG: 441 | Disposition: A | Discharge status: Home / Self Care | Payer: Medicare Other | Attending: Internal Medicine | Admitting: Internal Medicine

## 2018-02-09 ENCOUNTER — Telehealth: Payer: Self-pay | Admitting: *Deleted

## 2018-02-09 ENCOUNTER — Other Ambulatory Visit: Payer: Self-pay

## 2018-02-09 ENCOUNTER — Encounter (HOSPITAL_COMMUNITY): Payer: Self-pay | Admitting: *Deleted

## 2018-02-09 DIAGNOSIS — K766 Portal hypertension: Secondary | ICD-10-CM | POA: Diagnosis present

## 2018-02-09 DIAGNOSIS — Z823 Family history of stroke: Secondary | ICD-10-CM

## 2018-02-09 DIAGNOSIS — D62 Acute posthemorrhagic anemia: Secondary | ICD-10-CM

## 2018-02-09 DIAGNOSIS — Z85828 Personal history of other malignant neoplasm of skin: Secondary | ICD-10-CM

## 2018-02-09 DIAGNOSIS — D6959 Other secondary thrombocytopenia: Secondary | ICD-10-CM | POA: Diagnosis present

## 2018-02-09 DIAGNOSIS — K920 Hematemesis: Secondary | ICD-10-CM | POA: Diagnosis not present

## 2018-02-09 DIAGNOSIS — G2581 Restless legs syndrome: Secondary | ICD-10-CM | POA: Diagnosis present

## 2018-02-09 DIAGNOSIS — Z880 Allergy status to penicillin: Secondary | ICD-10-CM

## 2018-02-09 DIAGNOSIS — K7581 Nonalcoholic steatohepatitis (NASH): Principal | ICD-10-CM | POA: Diagnosis present

## 2018-02-09 DIAGNOSIS — I10 Essential (primary) hypertension: Secondary | ICD-10-CM | POA: Diagnosis present

## 2018-02-09 DIAGNOSIS — E669 Obesity, unspecified: Secondary | ICD-10-CM | POA: Diagnosis present

## 2018-02-09 DIAGNOSIS — K922 Gastrointestinal hemorrhage, unspecified: Secondary | ICD-10-CM

## 2018-02-09 DIAGNOSIS — Z9221 Personal history of antineoplastic chemotherapy: Secondary | ICD-10-CM

## 2018-02-09 DIAGNOSIS — K7469 Other cirrhosis of liver: Secondary | ICD-10-CM | POA: Diagnosis not present

## 2018-02-09 DIAGNOSIS — Z803 Family history of malignant neoplasm of breast: Secondary | ICD-10-CM

## 2018-02-09 DIAGNOSIS — Z91048 Other nonmedicinal substance allergy status: Secondary | ICD-10-CM

## 2018-02-09 DIAGNOSIS — D696 Thrombocytopenia, unspecified: Secondary | ICD-10-CM | POA: Diagnosis not present

## 2018-02-09 DIAGNOSIS — Z8249 Family history of ischemic heart disease and other diseases of the circulatory system: Secondary | ICD-10-CM

## 2018-02-09 DIAGNOSIS — E1140 Type 2 diabetes mellitus with diabetic neuropathy, unspecified: Secondary | ICD-10-CM | POA: Diagnosis present

## 2018-02-09 DIAGNOSIS — Z833 Family history of diabetes mellitus: Secondary | ICD-10-CM

## 2018-02-09 DIAGNOSIS — Z888 Allergy status to other drugs, medicaments and biological substances status: Secondary | ICD-10-CM

## 2018-02-09 DIAGNOSIS — Z6833 Body mass index (BMI) 33.0-33.9, adult: Secondary | ICD-10-CM

## 2018-02-09 DIAGNOSIS — C786 Secondary malignant neoplasm of retroperitoneum and peritoneum: Secondary | ICD-10-CM | POA: Diagnosis present

## 2018-02-09 DIAGNOSIS — Z8052 Family history of malignant neoplasm of bladder: Secondary | ICD-10-CM

## 2018-02-09 DIAGNOSIS — Z794 Long term (current) use of insulin: Secondary | ICD-10-CM

## 2018-02-09 DIAGNOSIS — N3281 Overactive bladder: Secondary | ICD-10-CM | POA: Diagnosis present

## 2018-02-09 DIAGNOSIS — Z825 Family history of asthma and other chronic lower respiratory diseases: Secondary | ICD-10-CM

## 2018-02-09 DIAGNOSIS — Z87891 Personal history of nicotine dependence: Secondary | ICD-10-CM

## 2018-02-09 DIAGNOSIS — K7460 Unspecified cirrhosis of liver: Secondary | ICD-10-CM | POA: Diagnosis present

## 2018-02-09 DIAGNOSIS — I8511 Secondary esophageal varices with bleeding: Secondary | ICD-10-CM | POA: Diagnosis present

## 2018-02-09 DIAGNOSIS — Z8261 Family history of arthritis: Secondary | ICD-10-CM

## 2018-02-09 DIAGNOSIS — Z91013 Allergy to seafood: Secondary | ICD-10-CM

## 2018-02-09 DIAGNOSIS — C569 Malignant neoplasm of unspecified ovary: Secondary | ICD-10-CM | POA: Diagnosis present

## 2018-02-09 DIAGNOSIS — Z90710 Acquired absence of both cervix and uterus: Secondary | ICD-10-CM

## 2018-02-09 DIAGNOSIS — H4020X0 Unspecified primary angle-closure glaucoma, stage unspecified: Secondary | ICD-10-CM | POA: Diagnosis present

## 2018-02-09 DIAGNOSIS — K3189 Other diseases of stomach and duodenum: Secondary | ICD-10-CM | POA: Diagnosis present

## 2018-02-09 DIAGNOSIS — I252 Old myocardial infarction: Secondary | ICD-10-CM

## 2018-02-09 LAB — COMPREHENSIVE METABOLIC PANEL
ALT: 32 U/L (ref 0–44)
AST: 36 U/L (ref 15–41)
Albumin: 3.6 g/dL (ref 3.5–5.0)
Alkaline Phosphatase: 69 U/L (ref 38–126)
Anion gap: 14 (ref 5–15)
BUN: 49 mg/dL — ABNORMAL HIGH (ref 8–23)
CO2: 19 mmol/L — ABNORMAL LOW (ref 22–32)
Calcium: 8.9 mg/dL (ref 8.9–10.3)
Chloride: 108 mmol/L (ref 98–111)
Creatinine, Ser: 0.81 mg/dL (ref 0.44–1.00)
GFR calc Af Amer: >60 mL/min (ref >60)
GFR calc non Af Amer: >60 mL/min (ref >60)
Glucose, Bld: 141 mg/dL — ABNORMAL HIGH (ref 70–99)
Potassium: 5.7 mmol/L — ABNORMAL HIGH (ref 3.5–5.1)
Sodium: 141 mmol/L (ref 135–145)
Total Bilirubin: 1.5 mg/dL — ABNORMAL HIGH (ref 0.3–1.2)
Total Protein: 6.7 g/dL (ref 6.5–8.1)

## 2018-02-09 LAB — MRSA PCR SCREENING: MRSA by PCR: NEGATIVE

## 2018-02-09 LAB — RENAL FUNCTION PANEL
Albumin: 3.1 g/dL — ABNORMAL LOW (ref 3.5–5.0)
Anion gap: 10 (ref 5–15)
BUN: 49 mg/dL — ABNORMAL HIGH (ref 8–23)
CO2: 20 mmol/L — ABNORMAL LOW (ref 22–32)
Calcium: 8.7 mg/dL — ABNORMAL LOW (ref 8.9–10.3)
Chloride: 111 mmol/L (ref 98–111)
Creatinine, Ser: 0.97 mg/dL (ref 0.44–1.00)
GFR calc Af Amer: >60 mL/min (ref >60)
GFR calc non Af Amer: 59 mL/min — ABNORMAL LOW (ref >60)
Glucose, Bld: 166 mg/dL — ABNORMAL HIGH (ref 70–99)
Phosphorus: 3.9 mg/dL (ref 2.5–4.6)
Potassium: 4.9 mmol/L (ref 3.5–5.1)
Sodium: 141 mmol/L (ref 135–145)

## 2018-02-09 LAB — LACTIC ACID, PLASMA
Lactic Acid, Venous: 5 mmol/L (ref 0.5–1.9)
Lactic Acid, Venous: 4.1 mmol/L (ref 0.5–1.9)
Lactic Acid, Venous: 3.1 mmol/L (ref 0.5–1.9)

## 2018-02-09 LAB — CBC
HCT: 31.6 % — ABNORMAL LOW (ref 36.0–46.0)
Hemoglobin: 9.5 g/dL — ABNORMAL LOW (ref 12.0–15.0)
MCH: 29.9 pg (ref 26.0–34.0)
MCHC: 30.1 g/dL (ref 30.0–36.0)
MCV: 99.4 fL (ref 80.0–100.0)
Platelets: 138 10*3/uL — ABNORMAL LOW (ref 150–400)
RBC: 3.18 MIL/uL — ABNORMAL LOW (ref 3.87–5.11)
RDW: 17.2 % — ABNORMAL HIGH (ref 11.5–15.5)
WBC: 10.7 10*3/uL — ABNORMAL HIGH (ref 4.0–10.5)
nRBC: 0.2 % (ref 0.0–0.2)

## 2018-02-09 LAB — PHOSPHORUS: Phosphorus: 4 mg/dL (ref 2.5–4.6)

## 2018-02-09 LAB — HEMOGLOBIN: Hemoglobin: 8 g/dL — ABNORMAL LOW (ref 12.0–15.0)

## 2018-02-09 LAB — TROPONIN I
Troponin I: <0.03 ng/mL (ref <0.03)
Troponin I: <0.03 ng/mL (ref <0.03)

## 2018-02-09 LAB — PROTIME-INR
INR: 1.18
Prothrombin Time: 14.9 seconds (ref 11.4–15.2)

## 2018-02-09 LAB — GLUCOSE, CAPILLARY
Glucose-Capillary: 86 mg/dL (ref 70–99)
Glucose-Capillary: 97 mg/dL (ref 70–99)

## 2018-02-09 LAB — HEMATOCRIT: HCT: 26.4 % — ABNORMAL LOW (ref 36.0–46.0)

## 2018-02-09 LAB — MAGNESIUM: Magnesium: 1 mg/dL — ABNORMAL LOW (ref 1.7–2.4)

## 2018-02-09 MED ORDER — SODIUM CHLORIDE 0.9 % IV SOLN
50.0000 ug/h | INTRAVENOUS | Status: DC
  Filled 2018-02-09: qty 1

## 2018-02-09 MED ORDER — CALCIUM GLUCONATE-NACL 1-0.675 GM/50ML-% IV SOLN
1.0000 g | Freq: Once | INTRAVENOUS | Status: AC
  Administered 2018-02-09: 1000 mg via INTRAVENOUS
  Filled 2018-02-09: qty 50

## 2018-02-09 MED ORDER — HYDROCODONE-ACETAMINOPHEN 5-325 MG PO TABS
1.0000 | ORAL_TABLET | Freq: Four times a day (QID) | ORAL | Status: DC | PRN
  Administered 2018-02-09 – 2018-02-11 (×4): 1 via ORAL
  Filled 2018-02-09 (×5): qty 1

## 2018-02-09 MED ORDER — HYDROCHLOROTHIAZIDE 12.5 MG PO CAPS
12.5000 mg | ORAL_CAPSULE | Freq: Every day | ORAL | Status: DC
  Administered 2018-02-09 – 2018-02-12 (×4): 12.5 mg via ORAL
  Filled 2018-02-09 (×4): qty 1

## 2018-02-09 MED ORDER — SODIUM CHLORIDE 0.9 % IV SOLN
80.0000 mg | Freq: Once | INTRAVENOUS | Status: AC
  Administered 2018-02-09: 80 mg via INTRAVENOUS
  Filled 2018-02-09 (×2): qty 80

## 2018-02-09 MED ORDER — PANTOPRAZOLE SODIUM 40 MG IV SOLR: 40.0000 mg | Freq: Two times a day (BID) | INTRAVENOUS | Status: DC

## 2018-02-09 MED ORDER — SODIUM CHLORIDE 0.9 % IV SOLN
1.0000 g | Freq: Once | INTRAVENOUS | Status: AC
  Administered 2018-02-09: 1 g via INTRAVENOUS
  Filled 2018-02-09: qty 10

## 2018-02-09 MED ORDER — SODIUM CHLORIDE 0.9 % IV BOLUS
1000.0000 mL | Freq: Once | INTRAVENOUS | Status: AC
  Administered 2018-02-09: 1000 mL via INTRAVENOUS

## 2018-02-09 MED ORDER — OCTREOTIDE LOAD VIA INFUSION
50.0000 ug | Freq: Once | INTRAVENOUS | Status: AC
  Administered 2018-02-09: 50 ug via INTRAVENOUS
  Filled 2018-02-09: qty 25

## 2018-02-09 MED ORDER — SODIUM CHLORIDE 0.9 % IV SOLN
INTRAVENOUS | Status: DC | PRN
  Administered 2018-02-10: 250 mL via INTRAVENOUS

## 2018-02-09 MED ORDER — SODIUM CHLORIDE 0.9 % IV SOLN
8.0000 mg/h | INTRAVENOUS | Status: DC
  Administered 2018-02-09: 8 mg/h via INTRAVENOUS
  Filled 2018-02-09 (×2): qty 80

## 2018-02-09 MED ORDER — GABAPENTIN 300 MG PO CAPS
600.0000 mg | ORAL_CAPSULE | Freq: Two times a day (BID) | ORAL | Status: DC
  Administered 2018-02-09 – 2018-02-12 (×6): 600 mg via ORAL
  Filled 2018-02-09 (×6): qty 2

## 2018-02-09 MED ORDER — SODIUM CHLORIDE 0.9% IV SOLUTION
Freq: Once | INTRAVENOUS | Status: AC
  Administered 2018-02-10: 08:00:00 via INTRAVENOUS

## 2018-02-09 MED ORDER — BISOPROLOL FUMARATE 5 MG PO TABS
10.0000 mg | ORAL_TABLET | Freq: Every day | ORAL | Status: DC
  Administered 2018-02-09 – 2018-02-11 (×3): 10 mg via ORAL
  Filled 2018-02-09 (×3): qty 2

## 2018-02-09 MED ORDER — SODIUM CHLORIDE 0.9 % IV SOLN: 80.0000 mg | Freq: Once | INTRAVENOUS | Status: DC

## 2018-02-09 MED ORDER — ROTIGOTINE 4 MG/24HR TD PT24
1.0000 | MEDICATED_PATCH | Freq: Every day | TRANSDERMAL | Status: DC
  Administered 2018-02-10 – 2018-02-12 (×3): 1 via TRANSDERMAL

## 2018-02-09 MED ORDER — ONDANSETRON HCL 4 MG/2ML IJ SOLN
4.0000 mg | Freq: Once | INTRAMUSCULAR | Status: AC
  Administered 2018-02-09: 4 mg via INTRAVENOUS
  Filled 2018-02-09: qty 2

## 2018-02-09 MED ORDER — SODIUM CHLORIDE 0.9 % IV SOLN
1.0000 g | Freq: Once | INTRAVENOUS | Status: DC
  Filled 2018-02-09: qty 10

## 2018-02-09 MED ORDER — SODIUM CHLORIDE 0.9 % IV SOLN
50.0000 ug/h | INTRAVENOUS | Status: DC
  Administered 2018-02-09: 50 ug/h via INTRAVENOUS
  Filled 2018-02-09 (×3): qty 1

## 2018-02-09 MED ORDER — SODIUM POLYSTYRENE SULFONATE 15 GM/60ML PO SUSP: 15.0000 g | Freq: Three times a day (TID) | ORAL | Status: DC

## 2018-02-09 MED ORDER — SODIUM ZIRCONIUM CYCLOSILICATE 10 G PO PACK
10.0000 g | PACK | Freq: Once | ORAL | Status: AC
  Administered 2018-02-09: 10 g via ORAL
  Filled 2018-02-09: qty 1

## 2018-02-09 MED ORDER — SODIUM CHLORIDE 0.9 % IV SOLN
50.0000 ug/h | INTRAVENOUS | Status: DC
  Administered 2018-02-09 – 2018-02-10 (×2): 50 ug/h via INTRAVENOUS
  Filled 2018-02-09 (×6): qty 1

## 2018-02-09 MED ORDER — SODIUM CHLORIDE 0.9 % IV SOLN
8.0000 mg/h | INTRAVENOUS | Status: DC
  Administered 2018-02-09 – 2018-02-10 (×2): 8 mg/h via INTRAVENOUS
  Filled 2018-02-09 (×3): qty 80

## 2018-02-09 NOTE — Progress Notes (Signed)
CRITICAL VALUE ALERT  Critical Value:  Lactic Acid 3.1  Date & Time Notied:  02/09/2018 2059  Provider Notified:K. Baltazar Najjar  Orders Received/Actions taken: 1L NS bolus and repeat lactic at 0030

## 2018-02-09 NOTE — ED Notes (Signed)
Date and time results received: 02/09/18 2:49 PM    Test: lactic acid Critical Value: 4.1  Name of Provider Notified: Mesner MD  Orders Received? Or Actions Taken?:  Acknowledges result

## 2018-02-09 NOTE — ED Triage Notes (Signed)
Pt complains of nausea, hematemesis since last night. Pt states she had black stools this morning. Pt last had chemo treatment 2 weeks ago. Pt has hx of ovarian cancer. Pt denies pain.

## 2018-02-09 NOTE — Telephone Encounter (Signed)
Patient called to report about 1am this morning she woke up vomiting copious amounts of blood. It stopped and she went back to bed. She woke this morning had a loose bowl movement that was "black and sticky" She reports she has not felt well all weekend running a low grade fever of 99.5.   Patient was advised to go to the ED as soon as possible. Patient verbalized an understanding.

## 2018-02-09 NOTE — Progress Notes (Signed)
Spoke with Dr.Mesner from ER.  Patient has history of cirrhosis and esophageal varices and presented with hematemesis last night, no further bleeding and remains stable hemodynamically.  Previous patient of Dr.Mann, wants the patient to be reassigned as unassigned.  Discussed with Dr.Mesner to start patient on octreotide and protonix, monitor H and H and transfuse to keep Hb above 7.  Keep Patient NPO with the intention to perform EGD with banding in am.   Ronnette Juniper, MD

## 2018-02-09 NOTE — ED Notes (Signed)
RN notified of abnormal lab 

## 2018-02-09 NOTE — H&P (Signed)
History and Physical  CHARISA TWITTY DVV:[id number] DOB: 27-Sep-1945 DOA: 02/09/2018  Referring physician: ER physician  PCP: Helane Rima, MD  Outpatient Specialists: Oncology Patient coming from: Home  Chief Complaint: Hematemesis  HPI: Patient is a 72 year old Caucasian female with past medical history significant for liver cirrhosis secondary to [name], ovarian cancer with peritoneal carcinomatosis, restless leg syndrome, portal hypertension, NSTEMI, diabetes mellitus and asthma.  Patient was seen alongside patient's husband.  Patient reports having vomited bright red blood last night, with dark-colored blood per rectum.  Associated vague abdominal pain.  Patient denies history of alcohol use or illicit drug use.  Hepatitis status is unknown to me.  Patient used NSAIDs in the past, but none recently.  On presentation to the hospital, the hemoglobin was found to have dropped from 11.6 g/dL to 9.5 g/dL in less than 2 weeks.  Lactic acid is 5.  Other pertinent labs include potassium of 5.7, bilirubin of 1.5, CO2 of 19, platelet count of 138.  No headache, no neck pain, no URI symptoms, no fever or chills, no diarrhea and no urinary symptoms.  Patient will be admitted for further assessment and management.  ED Course: ER physician has administered zirconium for the elevated potassium.  GI team is been consulted.  Hospitalist team called to admit patient. Pertinent labs: Essentially as in HPI. EKG: Independently reviewed.  Low voltage EKG.  Review of Systems:  Negative for fever, visual changes, sore throat, rash, new muscle aches, chest pain, SOB, dysuria.  Past Medical History:  Diagnosis Date  . Asthma   . Cirrhosis ([place])   . Complication of anesthesia    slow to wake up / n & v  . Diabetes mellitus   . Elevated LFTs   . Family history of adverse reaction to anesthesia    multiple family members have had problems such as slow to wake / "flat lined" /  ventilator    . Frequency of  urination   . Glaucoma, narrow-angle   . History of skin cancer   . Hypertension   . Non-ST elevated myocardial infarction (non-STEMI) [facility]) Sept 2012   Mild elevation in troponin and CK MB but no evidence of coronary disease at time of cath  . Obesity   . Ovarian cancer ([place]) 11/22/2013   Disseminated ovarian cancer  . Overactive bladder   . PONV (postoperative nausea and vomiting)   . Portal hypertension ([place])   . Restless leg syndrome   . Tingling    hands and feet    Past Surgical History:  Procedure Laterality Date  . ABDOMINAL ULTRASOUND  Sept 2012   No gallbladder disease, + fatty liver  . CARDIAC CATHETERIZATION  10/25/2010   EF 60%; Normal coronaries  . COLONOSCOPY  10/18/2005  . COSMETIC SURGERY  2003  . DENTAL SURGERY    . EYE SURGERY Bilateral 09/19/14 R eye, 08/2014 L eye   open angles in both eyes d/t narrow angle glaucoma  . LAPAROTOMY N/A 03/29/2014   Procedure: EXPLORATORY LAPAROTOMY;  Surgeon: Everitt Amber, MD;  Location: [facility] ORS;  Service: Gynecology;  Laterality: N/A;  . PARTIAL HYSTERECTOMY  1988  . SALPINGOOPHORECTOMY Bilateral 03/29/2014   Procedure: BILATERAL SALPINGO OOPHORECTOMY, OMENTECTOMY, DEBULKING;  Surgeon: Everitt Amber, MD;  Location: [facility] ORS;  Service: Gynecology;  Laterality: Bilateral;  . TONSILLECTOMY  1971  . TUBAL LIGATION    . [facility]  11/24/2009     reports that she quit smoking about 36 years ago. Her smoking use included cigarettes.  She has a 1.00 pack-year smoking history. She has never used smokeless tobacco. She reports that she does not drink alcohol or use drugs.  Allergies  Allergen Reactions  . Shellfish Allergy Anaphylaxis    HER THROAT SWELLS UP AND CLOSES  . Benadryl [Diphenhydramine Hcl] Other (See Comments)    Restless legs   . Penicillins Rash    DID THE REACTION INVOLVE: Swelling of the face/tongue/throat, SOB, or low BP? No Sudden or severe rash/hives, skin peeling, or the inside of the mouth or nose? No Did it  require medical treatment? No When did it last happen?Childhood allergy If all above answers are "NO", may proceed with cephalosporin use.   . Tape Itching    Red itch with some tapes    Family History  Problem Relation Age of Onset  . Asthma Mother   . Diabetes Mother   . Hypertension Mother   . Allergies Mother   . Rheum arthritis Mother   . Bladder Cancer Father 51       former heavy smoker  . Hypertension Father   . Stroke Father   . Breast cancer Sister 56  . Diabetes Sister        x 3  . Allergies Sister   . Asthma Sister        x 2   . Breast cancer Maternal Aunt 85  . Breast cancer Cousin 17       (maternal) bilateral cancer  . Aneurysm Son 64  . Diabetes Brother   . Allergies Brother   . Asthma Brother      Prior to Admission medications   Medication Sig Start Date End Date Taking? Authorizing Provider  bisoprolol (ZEBETA) 10 MG tablet Take 1 tablet (10 mg total) by mouth daily. 03/20/17  Yes Gorsuch, Ni, MD  cholecalciferol (VITAMIN D) 1000 units tablet Take 1,000 Units by mouth daily.   Yes [provider]  gabapentin (NEURONTIN) 300 MG capsule Take 2 capsules (600 mg total) by mouth 2 (two) times daily. 06/30/17  Yes Gorsuch, Ni, MD  hydrochlorothiazide (MICROZIDE) 12.5 MG capsule Take 12.5 mg by mouth daily. 05/21/17  Yes [provider]  HYDROcodone-acetaminophen (NORCO/VICODIN) 5-325 MG tablet take 1 tablet by mouth every 6 hours if needed for MODERATE PAIN 01/16/18  Yes Gorsuch, Ni, MD  insulin degludec (TRESIBA FLEXTOUCH) 100 UNIT/ML SOPN FlexTouch Pen Inject 18 Units into the skin daily.   Yes [provider]  lidocaine-prilocaine (EMLA) cream Apply to Porta-cath  1-2 hours prior to access as directed. 11/13/17  Yes Gorsuch, Ni, MD  metFORMIN (GLUCOPHAGE-XR) 500 MG 24 hr tablet Take 1,000 mg by mouth 2 (two) times daily.  03/27/15  Yes [provider]  ondansetron (ZOFRAN) 8 MG tablet Take 1 tablet (8 mg total) by mouth  every 8 (eight) hours as needed for nausea. 10/24/16  Yes Gorsuch, Ni, MD  pantoprazole (PROTONIX) 40 MG tablet Take 40 mg by mouth daily.  04/26/15  Yes [provider]  rotigotine (NEUPRO) 4 MG/24HR Place 1 patch onto the skin daily. 12/31/17  Yes TatEustace Quail, DO    Physical Exam: Vitals:   02/09/18 1330 02/09/18 1400 02/09/18 1430 02/09/18 1530  BP: (!) 152/66 (!) 146/79 (!) 147/60 (!) 151/64  Pulse: 86  88 86  Resp: 20 20 20 16   Temp:      TempSrc:      SpO2: 100%  98% 99%    Constitutional:  . Appears calm and comfortable Eyes:  .  Pallor. No jaundice.  ENMT:  . external ears, nose appear normal Neck:  . Neck is supple. No JVD Respiratory:  . CTA bilaterally, no w/r/r.  . Respiratory effort normal. No retractions or accessory muscle use Cardiovascular:  . S1S2 . No LE extremity edema   Abdomen:  . Abdomen is obese, soft and non tender. Organs are difficult to assess. Neurologic:  . Awake and alert. . Moves all limbs.  Wt Readings from Last 3 Encounters:  01/16/18 85.4 kg  01/02/18 84.7 kg  12/31/17 83.9 kg    I have personally reviewed following labs and imaging studies  Labs on Admission:  CBC: Recent Labs  Lab 02/09/18 1043  WBC 10.7*  HGB 9.5*  HCT 31.6*  MCV 99.4  PLT 697*   Basic Metabolic Panel: Recent Labs  Lab 02/09/18 1043  NA 141  K 5.7*  CL 108  CO2 19*  GLUCOSE 141*  BUN 49*  CREATININE 0.81  CALCIUM 8.9   Liver Function Tests: Recent Labs  Lab 02/09/18 1043  AST 36  ALT 32  ALKPHOS 69  BILITOT 1.5*  PROT 6.7  ALBUMIN 3.6   No results for input(s): LIPASE, AMYLASE in the last 168 hours. No results for input(s): AMMONIA in the last 168 hours. Coagulation Profile: Recent Labs  Lab 02/09/18 1043  INR 1.18   Cardiac Enzymes: No results for input(s): CKTOTAL, CKMB, CKMBINDEX, TROPONINI in the last 168 hours. BNP (last 3 results) No results for input(s): PROBNP in the last 8760 hours. HbA1C: No results  for input(s): HGBA1C in the last 72 hours. CBG: No results for input(s): GLUCAP in the last 168 hours. Lipid Profile: No results for input(s): CHOL, HDL, LDLCALC, TRIG, CHOLHDL, LDLDIRECT in the last 72 hours. Thyroid Function Tests: No results for input(s): TSH, T4TOTAL, FREET4, T3FREE, THYROIDAB in the last 72 hours. Anemia Panel: No results for input(s): VITAMINB12, FOLATE, FERRITIN, TIBC, IRON, RETICCTPCT in the last 72 hours. Urine analysis:    Component Value Date/Time   COLORURINE YELLOW 10/22/2017 [place] 10/22/2017 1041   LABSPEC 1.015 10/22/2017 1041   PHURINE 5.0 10/22/2017 1041   GLUCOSEU NEGATIVE 10/22/2017 1041   HGBUR MODERATE (A) 10/22/2017 1041   BILIRUBINUR NEGATIVE 10/22/2017 1041   KETONESUR NEGATIVE 10/22/2017 1041   PROTEINUR NEGATIVE 01/16/2018 1135   UROBILINOGEN 0.2 03/22/2014 0917   NITRITE POSITIVE (A) 10/22/2017 1041   LEUKOCYTESUR SMALL (A) 10/22/2017 1041   Sepsis Labs: @LABRCNTIP (procalcitonin:4,lacticidven:4) )No results found for this or any previous visit (from the past 240 hour(s)).    Radiological Exams on Admission: No results found.  EKG: Independently reviewed.   Active Problems:   GIB (gastrointestinal bleeding)   Assessment/Plan Upper GI bleed, suspect variceal bleed: Admit patient to stepdown Monitor H/H Octreotide drip Protonix drip GI consult already called by ER physician.  For EGD in the morning Type and crossmatch, and hold 2 units of blood at all times. Further management will depend on hospital course.  Anemia, acute blood loss anemia: H/H every 8 hourly Transfuse for hemoglobin less than 7 g/dL Further management depend on hospital course.  History of liver cirrhosis secondary to NASH: Continue to manage expectantly.  Thrombocytopenia: Likely secondary to liver disease. Monitor closely.  Ovarian cancer with peritoneal carcinomatosis: We will defer to outpatient management by the oncology  team.  Diabetes mellitus: Monitor closely. Accu-Cheks every 4 hourly while n.p.o. Call MD for blood sugar greater than 250 or less than 100 while n.p.o. Further management will  depend on hospital course.  History of NSTEMI: No symptoms for now. Troponin as per protocol. Until closely.  DVT prophylaxis: SCD Code Status: Full Family Communication: Patient's husband  Disposition Plan: Will depend on hospital course, likely home. Consults called: ER has already consulted GI Admission status: Observation  Time spent: 65 minutes.  Dana Allan, MD  [facility] Pager #: [phone] 7PM-7AM contact night coverage as above  02/09/2018, 4:37 PM

## 2018-02-09 NOTE — ED Provider Notes (Signed)
Emergency Department Provider Note   I have reviewed the triage vital signs and the nursing notes.   HISTORY  Chief Complaint Hematemesis; GI Bleeding; and chemo pt   HPI Olivia Benson is a 72 y.o. 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  Patient states that she also still has lower nausea and residual abdominal discomfort.  Never had this problem before.  Review of records it does appear that she has esophageal varices secondary to portal hypertension from cirrhosis that is, per the patient, from nonalcoholic steatohepatitis.  Patient is feeling lightheadedness or syncopal episodes at this time.  Husband states that her skin does appear little bit pale.  She has not had this problem before.  She has not take anything prior to arrival. No other associated or modifying symptoms.    Past Medical History:  Diagnosis Date  . Asthma   . Cirrhosis ([place])   . Complication of anesthesia    slow to wake up / n & v  . Diabetes mellitus   . Elevated LFTs   . Family history of adverse reaction to anesthesia    multiple family members have had problems such as slow to wake / "flat lined" /  ventilator    . Frequency of urination   . Glaucoma, narrow-angle   . History of skin cancer   . Hypertension   . Non-ST elevated myocardial infarction (non-STEMI) [facility]) Sept 2012   Mild elevation in troponin and CK MB but no evidence of coronary disease at time of cath  . Obesity   . Ovarian cancer ([place]) 11/22/2013   Disseminated ovarian cancer  . Overactive bladder   . PONV (postoperative nausea and vomiting)   . Portal hypertension ([place])   . Restless leg syndrome   . Tingling    hands and feet    Patient Active Problem List   Diagnosis Date Noted  . GIB (gastrointestinal bleeding)  02/09/2018  . Obesity due to excess calories 03/20/2017  . Cardiomyopathy ([place]) 01/24/2017  . Sinus congestion 01/24/2017  . 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 Chemotherapy-induced nausea 10/24/2016  . Goals of care, counseling/discussion 09/03/2016  . Thrombocytopenia ([place]) 04/15/2016  . Hot flashes due to tamoxifen 04/15/2016  . Vasomotor instability 01/17/2016  . Osteopenia determined by x-ray 12/22/2015  . Encounter for antineoplastic chemotherapy 12/09/2015  . 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 Elevated tumor markers 10/01/2015  . Obesity (BMI 35.0-39.9 without comorbidity) 05/31/2015  . Port catheter in place 03/29/2015  . Iron deficiency anemia 03/05/2015  . Low back pain 03/05/2015  . Neuropathic pain of foot 11/21/2014  . Portacath in place 05/10/2014  . Leukopenia due to antineoplastic chemotherapy ([place]) 05/10/2014  . Splenomegaly 05/10/2014  . Primary peritoneal carcinomatosis ([place]) 05/08/2014  . Genetic testing 03/16/2014  . Neutropenic fever ([place]) 01/27/2014  . Pancytopenia, acquired ([place]) 01/27/2014  . Fever and neutropenia ([place]) 01/27/2014  . Asthma 01/27/2014  . Malignant ascites 01/25/2014  . Chemotherapy induced neutropenia ([place]) 01/25/2014  . Chemotherapy induced thrombocytopenia 01/25/2014  . Restless legs syndrome 01/25/2014  . Liver cirrhosis secondary to NASH ([place]) 01/02/2014  . 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 Ascites 11/15/2013  . Elevated cancer antigen 125 (CA-125) 11/15/2013  . Cough variant asthma  vs UACS 09/02/2013  . Essential hypertension 09/02/2013  . Dyspnea 10/22/2012  . Fatty liver disease, nonalcoholic 02/72/5366  . S/P cardiac cath 11/06/2010    Past Surgical History:  Procedure Laterality Date  . ABDOMINAL ULTRASOUND  Sept 2012   No gallbladder disease, + fatty liver  . CARDIAC CATHETERIZATION   10/25/2010   EF 60%; Normal coronaries  . COLONOSCOPY  10/18/2005  . COSMETIC SURGERY  2003  . DENTAL SURGERY    . EYE SURGERY Bilateral 09/19/14 R eye, 08/2014 L eye   open angles in both eyes d/t narrow angle glaucoma  . LAPAROTOMY N/A 03/29/2014   Procedure: EXPLORATORY LAPAROTOMY;  Surgeon: Everitt Amber, MD;  Location: [facility];  Service: Gynecology;  Laterality: N/A;  . PARTIAL HYSTERECTOMY  1988  . SALPINGOOPHORECTOMY Bilateral 03/29/2014   Procedure: BILATERAL SALPINGO OOPHORECTOMY, OMENTECTOMY, DEBULKING;  Surgeon: Everitt Amber, MD;  Location: [facility];  Service: Gynecology;  Laterality: Bilateral;  . TONSILLECTOMY  1971  . TUBAL LIGATION    . WH-MAMMOGRAPHY  11/24/2009      Allergies Shellfish allergy; Benadryl [diphenhydramine hcl]; Penicillins; and Tape  Family History  Problem Relation Age of Onset  . Asthma Mother   . Diabetes Mother   . Hypertension Mother   . Allergies Mother   . Rheum arthritis Mother   . Bladder Cancer Father 26       former heavy smoker  . Hypertension Father   . Stroke Father   . Breast cancer Sister 85  . Diabetes Sister        x 3  . Allergies Sister   . Asthma Sister        x 2   . Breast cancer Maternal Aunt 85  . Breast cancer Cousin 31       (maternal) bilateral cancer  . Aneurysm Son 14  . Diabetes Brother   . Allergies Brother   . Asthma Brother     Social History Social History   Tobacco Use  . Smoking status: Former Smoker    Packs/day: 1.00    Years: 1.00    Pack years: 1.00    Types: Cigarettes    Last attempt to quit: 02/11/1982    Years since quitting: 36.0  . Smokeless tobacco: Never Used  Substance Use Topics  . Alcohol use: No    Alcohol/week: 0.0 standard drinks  . Drug use: No    Review of Systems  All other systems negative except as documented in the HPI. All pertinent positives and negatives as reviewed in the HPI. ____________________________________________   PHYSICAL EXAM:  VITAL SIGNS: ED Triage  Vitals  Enc Vitals Group     BP 02/09/18 0934 134/69     Pulse Rate 02/09/18 0934 85     Resp 02/09/18 0934 18     Temp 02/09/18 0934 (!) 97.4 F (36.3 C)     Temp Source 02/09/18 0934 Oral     SpO2 02/09/18 0934 98 %    Constitutional: Alert and oriented. Well appearing and in no acute distress. Eyes: Conjunctivae are pale. PERRL. EOMI. Head: Atraumatic. Nose: No congestion/rhinnorhea. Mouth/Throat: Mucous membranes are moist.  Oropharynx non-erythematous. Neck: No stridor.  No meningeal signs.   Cardiovascular: Normal rate, regular rhythm. Good peripheral circulation. Grossly normal heart sounds.   Respiratory: Normal respiratory effort.  No retractions. Lungs CTAB. Gastrointestinal: Soft and nontender. No distention.  Musculoskeletal: No lower extremity tenderness nor edema. No gross deformities of extremities. Neurologic:  Normal speech and language. No  gross focal neurologic deficits are appreciated.  Skin:  Skin is warm, dry and intact. No rash noted. Mild pallor noted by husband.   ____________________________________________   LABS (all labs ordered are listed, but only abnormal results are displayed)  Labs Reviewed  COMPREHENSIVE METABOLIC PANEL - Abnormal; Notable for the following components:      Result Value   Potassium 5.7 (*)    CO2 19 (*)    Glucose, Bld 141 (*)    BUN 49 (*)    Total Bilirubin 1.5 (*)    All other components within normal limits  CBC - Abnormal; Notable for the following components:   WBC 10.7 (*)    RBC 3.18 (*)    Hemoglobin 9.5 (*)    HCT 31.6 (*)    RDW 17.2 (*)    Platelets 138 (*)    All other components within normal limits  LACTIC ACID, PLASMA - Abnormal; Notable for the following components:   Lactic Acid, Venous 5.0 (*)    All other components within normal limits  LACTIC ACID, PLASMA - Abnormal; Notable for the following components:   Lactic Acid, Venous 4.1 (*)    All other components within normal limits  LACTIC ACID,  PLASMA - Abnormal; Notable for the following components:   Lactic Acid, Venous 3.1 (*)    All other components within normal limits  RENAL FUNCTION PANEL - Abnormal; Notable for the following components:   CO2 20 (*)    Glucose, Bld 166 (*)    BUN 49 (*)    Calcium 8.7 (*)    Albumin 3.1 (*)    GFR calc non Af Amer 59 (*)    All other components within normal limits  RENAL FUNCTION PANEL - Abnormal; Notable for the following components:   Chloride 112 (*)    BUN 48 (*)    Calcium 8.2 (*)    Phosphorus 4.9 (*)    Albumin 2.8 (*)    All other components within normal limits  MAGNESIUM - Abnormal; Notable for the following components:   Magnesium 1.0 (*)    All other components within normal limits  HEMOGLOBIN - Abnormal; Notable for the following components:   Hemoglobin 8.0 (*)    All other components within normal limits  HEMATOCRIT - Abnormal; Notable for the following components:   HCT 26.4 (*)    All other components within normal limits  COMPREHENSIVE METABOLIC PANEL - Abnormal; Notable for the following components:   Chloride 114 (*)    Glucose, Bld 103 (*)    BUN 45 (*)    Calcium 8.2 (*)    Total Protein 5.4 (*)    Albumin 2.8 (*)    GFR calc non Af Amer 57 (*)    All other components within normal limits  PROTIME-INR - Abnormal; Notable for the following components:   Prothrombin Time 16.5 (*)    All other components within normal limits  CBC - Abnormal; Notable for the following components:   RBC 2.30 (*)    Hemoglobin 6.9 (*)    HCT 22.8 (*)    RDW 17.4 (*)    Platelets 88 (*)    All other components within normal limits  MRSA PCR SCREENING  PROTIME-INR  PHOSPHORUS  TROPONIN I  TROPONIN I  GLUCOSE, CAPILLARY  LACTIC ACID, PLASMA  GLUCOSE, CAPILLARY  GLUCOSE, CAPILLARY  HIV ANTIBODY (ROUTINE TESTING W REFLEX)  RENAL FUNCTION PANEL  HEMOGLOBIN  HEMATOCRIT  HEMOGLOBIN  HEMATOCRIT  TYPE  AND SCREEN  PREPARE RBC (CROSSMATCH)    ____________________________________________  EKG   EKG Interpretation  Date/Time:    Ventricular Rate:    PR Interval:    QRS Duration:   QT Interval:    QTC Calculation:   R Axis:     Text Interpretation:         ____________________________________________  RADIOLOGY  No results found.  ____________________________________________   PROCEDURES  Procedure(s) performed:   Procedures  CRITICAL CARE Performed by: Merrily Pew Total critical care time: 35 minutes Critical care time was exclusive of separately billable procedures and treating other patients. Critical care was necessary to treat or prevent imminent or life-threatening deterioration. Critical care was time spent personally by me on the following activities: development of treatment plan with patient and/or surrogate as well as nursing, discussions with consultants, evaluation of patient's response to treatment, examination of patient, obtaining history from patient or surrogate, ordering and performing treatments and interventions, ordering and review of laboratory studies, ordering and review of radiographic studies, pulse oximetry and re-evaluation of patient's condition.  ____________________________________________   INITIAL IMPRESSION / ASSESSMENT AND PLAN / ED COURSE  With dark tarry stools and bright red hematemesis suspect patient likely has a bleeding and varices.  Rocephin, Protonix, octreotide started.  She is hemodynamically stable at this time so we will wait results of some of her labs prior to calling GI and medicine for admission.  Patient identified Dr. Hildred Priest her gastroenterologist that she had done a colonoscopy in the past.  There is a significant delay in return of multiple pages to Dr. Collene Mares over approximately 2 to 3 hours timeframe.  When Dr. Collene Mares finally returned the page she stated it was not her patient. I reitirated to Dr. Collene Mares that the patient had a colonoscopy with her awhile  back and identified her as her gastroenterologist. Dr. Collene Mares said "she's not in my database, she is not my patient, I will not see her."  Paged on-call GI with [facility], Dr. Therisa Doyne who graciously agrees to do the right thing for the patient see the patient and management.  Still hemodynamically stable at this time. Lactic trending down.   Hyperkalemia. Ca ordered. lokelma ordered. ECG ordered.   Will continue current regimen and admit to medicine.    Pertinent labs & imaging results that were available during my care of the patient were reviewed by me and considered in my medical decision making (see chart for details).  ____________________________________________  FINAL CLINICAL IMPRESSION(S) / ED DIAGNOSES  Final diagnoses:  None     MEDICATIONS GIVEN DURING THIS VISIT:  Medications  HYDROcodone-acetaminophen (NORCO/VICODIN) 5-325 MG per tablet 1 tablet (1 tablet Oral Given 02/09/18 2106)  bisoprolol (ZEBETA) tablet 10 mg (10 mg Oral Given 02/09/18 2106)  hydrochlorothiazide (MICROZIDE) capsule 12.5 mg (12.5 mg Oral Given 02/09/18 2106)  gabapentin (NEURONTIN) capsule 600 mg (600 mg Oral Given 02/09/18 2106)  rotigotine (NEUPRO) 4 MG/24HR 1 patch (has no administration in time range)  octreotide (SANDOSTATIN) 500 mcg in sodium chloride 0.9 % 250 mL (2 mcg/mL) infusion (50 mcg/hr Intravenous New Bag/Given 02/09/18 2331)  pantoprazole (PROTONIX) 80 mg in sodium chloride 0.9 % 250 mL (0.32 mg/mL) infusion (8 mg/hr Intravenous Rate/Dose Verify 02/09/18 2305)  pantoprazole (PROTONIX) injection 40 mg (has no administration in time range)  0.9 %  sodium chloride infusion (Manually program via Guardrails IV Fluids) (has no administration in time range)  0.9 %  sodium chloride infusion (10 mL/hr Intravenous Rate/Dose Verify 02/09/18 2305)  sodium chloride flush (NS) 0.9 % injection 10-40 mL (has no administration in time range)  Chlorhexidine Gluconate Cloth 2 % PADS 6 each (has no  administration in time range)  0.9 %  sodium chloride infusion (Manually program via Guardrails IV Fluids) (has no administration in time range)  pantoprazole (PROTONIX) 80 mg in sodium chloride 0.9 % 100 mL IVPB (0 mg Intravenous Stopped 02/09/18 1243)  cefTRIAXone (ROCEPHIN) 1 g in sodium chloride 0.9 % 100 mL IVPB (0 g Intravenous Stopped 02/09/18 1209)  octreotide (SANDOSTATIN) 2 mcg/mL load via infusion 50 mcg (50 mcg Intravenous Bolus from Bag 02/09/18 1401)  sodium chloride 0.9 % bolus 1,000 mL (1,000 mLs Intravenous Bolus 02/09/18 1134)  ondansetron (ZOFRAN) injection 4 mg (4 mg Intravenous Given 02/09/18 1134)  sodium zirconium cyclosilicate (LOKELMA) packet 10 g (10 g Oral Given 02/09/18 1305)  calcium gluconate 1 g/ 50 mL sodium chloride IVPB (0 g Intravenous Stopped 02/09/18 1411)  sodium chloride 0.9 % bolus 1,000 mL (0 mLs Intravenous Stopped 02/09/18 2312)     NEW OUTPATIENT MEDICATIONS STARTED DURING THIS VISIT:  Current Discharge Medication List      Note:  This note was prepared with assistance of Dragon voice recognition software. Occasional wrong-word or sound-a-like substitutions may have occurred due to the inherent limitations of voice recognition software.  Merrily Pew, MD 02/10/18 [phone]

## 2018-02-10 ENCOUNTER — Encounter (HOSPITAL_COMMUNITY)
Admission: EM | Disposition: A | Discharge status: Home / Self Care | Payer: Self-pay | Source: Home / Self Care | Attending: Internal Medicine

## 2018-02-10 ENCOUNTER — Encounter (HOSPITAL_COMMUNITY): Payer: Self-pay | Admitting: *Deleted

## 2018-02-10 ENCOUNTER — Observation Stay (HOSPITAL_COMMUNITY): Payer: Medicare Other | Admitting: Anesthesiology

## 2018-02-10 DIAGNOSIS — K920 Hematemesis: Secondary | ICD-10-CM | POA: Diagnosis present

## 2018-02-10 DIAGNOSIS — D62 Acute posthemorrhagic anemia: Secondary | ICD-10-CM | POA: Diagnosis present

## 2018-02-10 DIAGNOSIS — Z87891 Personal history of nicotine dependence: Secondary | ICD-10-CM | POA: Diagnosis not present

## 2018-02-10 DIAGNOSIS — K7581 Nonalcoholic steatohepatitis (NASH): Secondary | ICD-10-CM | POA: Diagnosis present

## 2018-02-10 DIAGNOSIS — Z833 Family history of diabetes mellitus: Secondary | ICD-10-CM | POA: Diagnosis not present

## 2018-02-10 DIAGNOSIS — C569 Malignant neoplasm of unspecified ovary: Secondary | ICD-10-CM | POA: Diagnosis present

## 2018-02-10 DIAGNOSIS — Z823 Family history of stroke: Secondary | ICD-10-CM | POA: Diagnosis not present

## 2018-02-10 DIAGNOSIS — I252 Old myocardial infarction: Secondary | ICD-10-CM | POA: Diagnosis not present

## 2018-02-10 DIAGNOSIS — K922 Gastrointestinal hemorrhage, unspecified: Secondary | ICD-10-CM | POA: Diagnosis not present

## 2018-02-10 DIAGNOSIS — K766 Portal hypertension: Secondary | ICD-10-CM | POA: Diagnosis present

## 2018-02-10 DIAGNOSIS — Z8052 Family history of malignant neoplasm of bladder: Secondary | ICD-10-CM | POA: Diagnosis not present

## 2018-02-10 DIAGNOSIS — I8511 Secondary esophageal varices with bleeding: Secondary | ICD-10-CM | POA: Diagnosis present

## 2018-02-10 DIAGNOSIS — Z880 Allergy status to penicillin: Secondary | ICD-10-CM | POA: Diagnosis not present

## 2018-02-10 DIAGNOSIS — Z9221 Personal history of antineoplastic chemotherapy: Secondary | ICD-10-CM | POA: Diagnosis not present

## 2018-02-10 DIAGNOSIS — Z85828 Personal history of other malignant neoplasm of skin: Secondary | ICD-10-CM | POA: Diagnosis not present

## 2018-02-10 DIAGNOSIS — Z803 Family history of malignant neoplasm of breast: Secondary | ICD-10-CM | POA: Diagnosis not present

## 2018-02-10 DIAGNOSIS — I8501 Esophageal varices with bleeding: Secondary | ICD-10-CM | POA: Diagnosis not present

## 2018-02-10 DIAGNOSIS — G2581 Restless legs syndrome: Secondary | ICD-10-CM | POA: Diagnosis present

## 2018-02-10 DIAGNOSIS — Z8249 Family history of ischemic heart disease and other diseases of the circulatory system: Secondary | ICD-10-CM | POA: Diagnosis not present

## 2018-02-10 DIAGNOSIS — N3281 Overactive bladder: Secondary | ICD-10-CM | POA: Diagnosis present

## 2018-02-10 DIAGNOSIS — I10 Essential (primary) hypertension: Secondary | ICD-10-CM | POA: Diagnosis present

## 2018-02-10 DIAGNOSIS — C786 Secondary malignant neoplasm of retroperitoneum and peritoneum: Secondary | ICD-10-CM | POA: Diagnosis present

## 2018-02-10 DIAGNOSIS — Z825 Family history of asthma and other chronic lower respiratory diseases: Secondary | ICD-10-CM | POA: Diagnosis not present

## 2018-02-10 DIAGNOSIS — Z8261 Family history of arthritis: Secondary | ICD-10-CM | POA: Diagnosis not present

## 2018-02-10 DIAGNOSIS — H4020X0 Unspecified primary angle-closure glaucoma, stage unspecified: Secondary | ICD-10-CM | POA: Diagnosis present

## 2018-02-10 DIAGNOSIS — E1140 Type 2 diabetes mellitus with diabetic neuropathy, unspecified: Secondary | ICD-10-CM | POA: Diagnosis present

## 2018-02-10 DIAGNOSIS — Z90710 Acquired absence of both cervix and uterus: Secondary | ICD-10-CM | POA: Diagnosis not present

## 2018-02-10 HISTORY — PX: ESOPHAGEAL BANDING: SHX5518

## 2018-02-10 HISTORY — PX: ESOPHAGOGASTRODUODENOSCOPY (EGD) WITH PROPOFOL: SHX5813

## 2018-02-10 LAB — COMPREHENSIVE METABOLIC PANEL
ALT: 26 U/L (ref 0–44)
AST: 29 U/L (ref 15–41)
Albumin: 2.8 g/dL — ABNORMAL LOW (ref 3.5–5.0)
Alkaline Phosphatase: 45 U/L (ref 38–126)
Anion gap: 8 (ref 5–15)
BUN: 45 mg/dL — ABNORMAL HIGH (ref 8–23)
CO2: 23 mmol/L (ref 22–32)
Calcium: 8.2 mg/dL — ABNORMAL LOW (ref 8.9–10.3)
Chloride: 114 mmol/L — ABNORMAL HIGH (ref 98–111)
Creatinine, Ser: 1 mg/dL (ref 0.44–1.00)
GFR calc Af Amer: >60 mL/min (ref >60)
GFR calc non Af Amer: 57 mL/min — ABNORMAL LOW (ref >60)
Glucose, Bld: 103 mg/dL — ABNORMAL HIGH (ref 70–99)
Potassium: 4.4 mmol/L (ref 3.5–5.1)
Sodium: 145 mmol/L (ref 135–145)
Total Bilirubin: 0.7 mg/dL (ref 0.3–1.2)
Total Protein: 5.4 g/dL — ABNORMAL LOW (ref 6.5–8.1)

## 2018-02-10 LAB — CBC
HCT: 22.8 % — ABNORMAL LOW (ref 36.0–46.0)
Hemoglobin: 6.9 g/dL — CL (ref 12.0–15.0)
MCH: 30 pg (ref 26.0–34.0)
MCHC: 30.3 g/dL (ref 30.0–36.0)
MCV: 99.1 fL (ref 80.0–100.0)
Platelets: 88 10*3/uL — ABNORMAL LOW (ref 150–400)
RBC: 2.3 MIL/uL — ABNORMAL LOW (ref 3.87–5.11)
RDW: 17.4 % — ABNORMAL HIGH (ref 11.5–15.5)
WBC: 5.3 10*3/uL (ref 4.0–10.5)
nRBC: 0 % (ref 0.0–0.2)

## 2018-02-10 LAB — GLUCOSE, CAPILLARY
Glucose-Capillary: 113 mg/dL — ABNORMAL HIGH (ref 70–99)
Glucose-Capillary: 117 mg/dL — ABNORMAL HIGH (ref 70–99)
Glucose-Capillary: 118 mg/dL — ABNORMAL HIGH (ref 70–99)
Glucose-Capillary: 123 mg/dL — ABNORMAL HIGH (ref 70–99)
Glucose-Capillary: 132 mg/dL — ABNORMAL HIGH (ref 70–99)
Glucose-Capillary: 90 mg/dL (ref 70–99)

## 2018-02-10 LAB — PROTIME-INR
INR: 1.34
Prothrombin Time: 16.5 seconds — ABNORMAL HIGH (ref 11.4–15.2)

## 2018-02-10 LAB — HEMOGLOBIN
Hemoglobin: 9.7 g/dL — ABNORMAL LOW (ref 12.0–15.0)
Hemoglobin: 9.4 g/dL — ABNORMAL LOW (ref 12.0–15.0)

## 2018-02-10 LAB — HIV ANTIBODY (ROUTINE TESTING W REFLEX): HIV Screen 4th Generation wRfx: NONREACTIVE

## 2018-02-10 LAB — RENAL FUNCTION PANEL
Albumin: 2.8 g/dL — ABNORMAL LOW (ref 3.5–5.0)
Albumin: 3.1 g/dL — ABNORMAL LOW (ref 3.5–5.0)
Anion gap: 8 (ref 5–15)
Anion gap: 9 (ref 5–15)
BUN: 48 mg/dL — ABNORMAL HIGH (ref 8–23)
BUN: 36 mg/dL — ABNORMAL HIGH (ref 8–23)
CO2: 23 mmol/L (ref 22–32)
CO2: 21 mmol/L — ABNORMAL LOW (ref 22–32)
Calcium: 8.2 mg/dL — ABNORMAL LOW (ref 8.9–10.3)
Calcium: 8 mg/dL — ABNORMAL LOW (ref 8.9–10.3)
Chloride: 112 mmol/L — ABNORMAL HIGH (ref 98–111)
Chloride: 112 mmol/L — ABNORMAL HIGH (ref 98–111)
Creatinine, Ser: 0.92 mg/dL (ref 0.44–1.00)
Creatinine, Ser: 1 mg/dL (ref 0.44–1.00)
GFR calc Af Amer: >60 mL/min (ref >60)
GFR calc Af Amer: >60 mL/min (ref >60)
GFR calc non Af Amer: >60 mL/min (ref >60)
GFR calc non Af Amer: 57 mL/min — ABNORMAL LOW (ref >60)
Glucose, Bld: 98 mg/dL (ref 70–99)
Glucose, Bld: 115 mg/dL — ABNORMAL HIGH (ref 70–99)
Phosphorus: 4.9 mg/dL — ABNORMAL HIGH (ref 2.5–4.6)
Phosphorus: 4.7 mg/dL — ABNORMAL HIGH (ref 2.5–4.6)
Potassium: 4.3 mmol/L (ref 3.5–5.1)
Potassium: 4 mmol/L (ref 3.5–5.1)
Sodium: 143 mmol/L (ref 135–145)
Sodium: 142 mmol/L (ref 135–145)

## 2018-02-10 LAB — PREPARE RBC (CROSSMATCH)

## 2018-02-10 LAB — HEMATOCRIT
HCT: 31.1 % — ABNORMAL LOW (ref 36.0–46.0)
HCT: 30 % — ABNORMAL LOW (ref 36.0–46.0)

## 2018-02-10 LAB — LACTIC ACID, PLASMA: Lactic Acid, Venous: 1.6 mmol/L (ref 0.5–1.9)

## 2018-02-10 SURGERY — ESOPHAGOGASTRODUODENOSCOPY (EGD) WITH PROPOFOL
Anesthesia: Monitor Anesthesia Care

## 2018-02-10 MED ORDER — PANTOPRAZOLE SODIUM 40 MG IV SOLR
40.0000 mg | Freq: Every morning | INTRAVENOUS | Status: DC
  Administered 2018-02-10 – 2018-02-12 (×3): 40 mg via INTRAVENOUS
  Filled 2018-02-10 (×2): qty 40

## 2018-02-10 MED ORDER — PROPOFOL 10 MG/ML IV BOLUS
INTRAVENOUS | Status: DC | PRN
  Administered 2018-02-10 (×2): 20 mg via INTRAVENOUS

## 2018-02-10 MED ORDER — SODIUM CHLORIDE 0.9 % IV SOLN
INTRAVENOUS | Status: DC
  Administered 2018-02-10: 10:00:00 via INTRAVENOUS

## 2018-02-10 MED ORDER — SODIUM CHLORIDE 0.9% FLUSH: 10.0000 mL | INTRAVENOUS | Status: DC | PRN

## 2018-02-10 MED ORDER — PROPOFOL 500 MG/50ML IV EMUL
INTRAVENOUS | Status: DC | PRN
  Administered 2018-02-10: 120 ug/kg/min via INTRAVENOUS

## 2018-02-10 MED ORDER — LACTATED RINGERS IV SOLN
INTRAVENOUS | Status: DC
  Administered 2018-02-10: 10:00:00 via INTRAVENOUS

## 2018-02-10 MED ORDER — HYDROMORPHONE HCL 1 MG/ML IJ SOLN
1.0000 mg | Freq: Once | INTRAMUSCULAR | Status: AC
  Administered 2018-02-10: 1 mg via INTRAVENOUS

## 2018-02-10 MED ORDER — HYDRALAZINE HCL 20 MG/ML IJ SOLN
10.0000 mg | Freq: Four times a day (QID) | INTRAMUSCULAR | Status: DC | PRN
  Administered 2018-02-10 (×2): 10 mg via INTRAVENOUS
  Filled 2018-02-10 (×2): qty 1

## 2018-02-10 MED ORDER — SODIUM CHLORIDE 0.9 % IV SOLN
2.0000 g | INTRAVENOUS | Status: DC
  Administered 2018-02-10 – 2018-02-11 (×2): 2 g via INTRAVENOUS
  Filled 2018-02-10 (×2): qty 2
  Filled 2018-02-10: qty 20

## 2018-02-10 MED ORDER — SODIUM CHLORIDE 0.9% IV SOLUTION
Freq: Once | INTRAVENOUS | Status: AC
  Administered 2018-02-10: 10:00:00 via INTRAVENOUS

## 2018-02-10 MED ORDER — CIPROFLOXACIN IN D5W 400 MG/200ML IV SOLN: 400.0000 mg | Freq: Two times a day (BID) | INTRAVENOUS | Status: DC

## 2018-02-10 MED ORDER — HYDROMORPHONE HCL 1 MG/ML IJ SOLN
INTRAMUSCULAR | Status: AC
  Filled 2018-02-10: qty 1

## 2018-02-10 MED ORDER — SODIUM CHLORIDE 0.9 % IV SOLN
50.0000 ug/h | INTRAVENOUS | Status: DC
  Administered 2018-02-10 – 2018-02-11 (×3): 50 ug/h via INTRAVENOUS
  Filled 2018-02-10 (×6): qty 1

## 2018-02-10 MED ORDER — PROPOFOL 10 MG/ML IV BOLUS
INTRAVENOUS | Status: AC
  Filled 2018-02-10: qty 40

## 2018-02-10 MED ORDER — CHLORHEXIDINE GLUCONATE CLOTH 2 % EX PADS
6.0000 | MEDICATED_PAD | Freq: Every day | CUTANEOUS | Status: DC
  Administered 2018-02-11 – 2018-02-12 (×2): 6 via TOPICAL

## 2018-02-10 SURGICAL SUPPLY — 14 items

## 2018-02-10 NOTE — Anesthesia Postprocedure Evaluation (Signed)
Anesthesia Post Note  Patient: Olivia Benson  Procedure(s) Performed: ESOPHAGOGASTRODUODENOSCOPY (EGD) WITH PROPOFOL (N/A ) ESOPHAGEAL BANDING     Patient location during evaluation: PACU Anesthesia Type: MAC Level of consciousness: awake and alert Pain management: pain level controlled Vital Signs Assessment: post-procedure vital signs reviewed and stable Respiratory status: spontaneous breathing, nonlabored ventilation and respiratory function stable Cardiovascular status: stable and blood pressure returned to baseline Anesthetic complications: no    Last Vitals:  Vitals:   02/10/18 1100 02/10/18 1110  BP: (!) 158/78 (!) 159/83  Pulse: 76 75  Resp: (!) 21 14  Temp:    SpO2: 98% 94%                  Audry Pili

## 2018-02-10 NOTE — Op Note (Signed)
[facility] Patient Name: Olivia Benson Procedure Date: 02/10/2018 MRN: [id number] Attending MD: Ronnette Juniper , MD Date of Birth: 06-Mar-1945 CSN: [id number] Age: 72 Admit Type: Inpatient Procedure:                Upper GI endoscopy Indications:              Coffee-ground emesis, Hematemesis, Melena Providers:                Ronnette Juniper, MD, Dorise Hiss, RN, Burtis Junes, RN,                            William Dalton, Technician Referring MD:              Medicines:                Monitored Anesthesia Care Complications:            No immediate complications. Estimated blood loss:                            None. Estimated Blood Loss:     Estimated blood loss: none. Procedure:                Pre-Anesthesia Assessment:                           - Prior to the procedure, a History and Physical                            was performed, and patient medications and                            allergies were reviewed. The patient's tolerance of                            previous anesthesia was also reviewed. The risks                            and benefits of the procedure and the sedation                            options and risks were discussed with the patient.                            All questions were answered, and informed consent                            was obtained. Prior Anticoagulants: The patient has                            taken no previous anticoagulant or antiplatelet                            agents. ASA Grade Assessment: III - A patient with  severe systemic disease. After reviewing the risks                            and benefits, the patient was deemed in                            satisfactory condition to undergo the procedure.                           After obtaining informed consent, the endoscope was                            passed under direct vision. Throughout the                            procedure, the  patient's blood pressure, pulse, and                            oxygen saturations were monitored continuously. The                            [id number]-H190 ([id number] Olympus adult endoscope was                            introduced through the mouth, and advanced to the                            second part of duodenum. The upper GI endoscopy was                            accomplished without difficulty. The patient                            tolerated the procedure well. Scope In: Scope Out: Findings:      Five columns of non-bleeding grade III, large (> 5 mm) varices were       found in the upper third of the esophagus, in the middle third of the       esophagus and in the lower third of the esophagus, starting from 20 cm       from the incisors to the GE junction at 35 cm. They were large in size.       Stigmata of recent bleeding were evident and red wale signs were       present. Seven bands were successfully placed with complete eradication,       resulting in deflation of varices. There was no bleeding at the end of       the procedure.      Mild portal hypertensive gastropathy was found in the entire examined       stomach.      The cardia and gastric fundus were normal on retroflexion.      The examined duodenum was normal. Impression:               - Recently bleeding grade III and large (> 5 mm)  esophageal varices. Completely eradicated. Banded.                           - Portal hypertensive gastropathy.                           - Normal examined duodenum.                           - No specimens collected. Moderate Sedation:      Patient did not receive moderate sedation for this procedure, but       instead received monitored anesthesia care. Recommendation:           - Clear liquid diet.                           - Continue present medications- octreotide drip for                            another 24 hours and d/c protonix drip, switch to                             protonix 40 mg po daily for 2 weeks, avoid NSAIDs.                           - Repeat upper endoscopy in 6 weeks for retreatment.                           - Non selective Beta blocker in next 24-48 hours to                            keep HR around 55-60/min and SBP above 90 mmHg. Procedure Code(s):        --- Professional ---                           [phone], Esophagogastroduodenoscopy, flexible,                            transoral; with band ligation of esophageal/gastric                            varices Diagnosis Code(s):        --- Professional ---                           I85.01, Esophageal varices with bleeding                           K76.6, Portal hypertension                           K31.89, Other diseases of stomach and duodenum                           K92.0, Hematemesis  K92.1, Melena (includes Hematochezia) CPT copyright 2018 [employer]. All rights reserved. The codes documented in this report are preliminary and upon coder review may  be revised to meet current compliance requirements. Ronnette Juniper, MD 02/10/2018 10:46:47 AM This report has been signed electronically. Number of Addenda: 0

## 2018-02-10 NOTE — Progress Notes (Signed)
PROGRESS NOTE    Olivia Benson  OZH:[id number] DOB: Apr 08, 1945 DOA: 02/09/2018 PCP: Helane Rima, MD    Brief Narrative: 72 year old with past medical history significant for liver cirrhosis secondary to [name], ovarian cancer with peritoneal carcinomatosis, restless leg syndrome, portal hypertension, history of non-STEMI, diabetes, asthma who presented with hematemesis and dark-colored red blood per rectum.  Patient was also having some abdominal pain.  On admission lactic acid was at 5, bilirubin 1.5 platelet count 138 hemoglobin dropped from 11-9. Patient was a started on IV Protonix, octreotide.  GI has been consulted and plan is for endoscopy on 02/10/2018.  Assessment & Plan:   Active Problems:   GIB (gastrointestinal bleeding)   Acute GI bleed, upper GI bleed likely variceal bleed; Hemoglobin dropped feeder to 6. Patient currently getting 2 units of packed red blood cell. Plan is for endoscopy today. Continue to cycle hemoglobin.. Continue with octreotide.  History of cirrhosis secondary to [facility]; Continue with octreotide. Start IV ciprofloxacin  for SBP prophylaxis.  Thrombocytopenia; likely secondary to liver disease.  Follow trend.  Diabetes; sliding scale insulin.  History of non-STEMI; Denies chest pain  Hypertension; Continue with hydrochlorothiazide, bisoprolol.  PRN Hydralazine.   Ovarian cancer, with peritoneal carcinomatosis.  Follow with oncologist   Estimated body mass index is 33.95 kg/m as calculated from the following:   Height as of this encounter: 5' 2"  (1.575 m).   Weight as of this encounter: 84.2 kg.   DVT prophylaxis: SCD Code Status: Full code Family Communication: husband at bedside.  Disposition Plan: remain in the hospital for procedure and cycle hb.   Consultants:   GI   Procedures:   Endoscopy    Antimicrobials:   cipro   Subjective: Denies chest pain, abdominal pain  Getting blood transfusion.    Objective: Vitals:   02/10/18 0800 02/10/18 0804 02/10/18 0805 02/10/18 0819  BP:  (!) 111/48  (!) 111/48  Pulse:    65  Resp:    14  Temp: 98.1 F (36.7 C) (!) 97.4 F (36.3 C)  97.6 F (36.4 C)  TempSrc: Oral Oral Oral Oral  SpO2:    96%  Weight:      Height:        Intake/Output Summary (Last 24 hours) at 02/10/2018 0912 Last data filed at 02/10/2018 0905 Gross per 24 hour  Intake 1743.05 ml  Output -  Net 1743.05 ml   Filed Weights   02/09/18 1954  Weight: 84.2 kg    Examination:  General exam: Appears calm and comfortable  Respiratory system: Clear to auscultation. Respiratory effort normal. Cardiovascular system: S1 & S2 heard, RRR. No JVD, murmurs, rubs, gallops or clicks. No pedal edema. Gastrointestinal system: Abdomen is nondistended, soft and nontender. No organomegaly or masses felt. Normal bowel sounds heard. Central nervous system: Alert and oriented. No focal neurological deficits. Extremities: Symmetric 5 x 5 power. Skin: No rashes, lesions or ulcers Psychiatry: Judgement and insight appear normal. Mood & affect appropriate.     Data Reviewed: I have personally reviewed following labs and imaging studies  CBC: Recent Labs  Lab 02/09/18 1043 02/09/18 2020 02/10/18 0430  WBC 10.7*  --  5.3  HGB 9.5* 8.0* 6.9*  HCT 31.6* 26.4* 22.8*  MCV 99.4  --  99.1  PLT 138*  --  88*   Basic Metabolic Panel: Recent Labs  Lab 02/09/18 1043 02/09/18 2020 02/10/18 0133 02/10/18 0430  NA 141 141 143 145  K 5.7* 4.9 4.3 4.4  CL  108 111 112* 114*  CO2 19* 20* 23 23  GLUCOSE 141* 166* 98 103*  BUN 49* 49* 48* 45*  CREATININE 0.81 0.97 0.92 1.00  CALCIUM 8.9 8.7* 8.2* 8.2*  MG  --  1.0*  --   --   PHOS  --  4.0  3.9 4.9*  --    GFR: Estimated Creatinine Clearance: 52.6 mL/min (by C-G formula based on SCr of 1 mg/dL). Liver Function Tests: Recent Labs  Lab 02/09/18 1043 02/09/18 2020 02/10/18 0133 02/10/18 0430  AST 36  --   --  29  ALT  32  --   --  26  ALKPHOS 69  --   --  45  BILITOT 1.5*  --   --  0.7  PROT 6.7  --   --  5.4*  ALBUMIN 3.6 3.1* 2.8* 2.8*   No results for input(s): LIPASE, AMYLASE in the last 168 hours. No results for input(s): AMMONIA in the last 168 hours. Coagulation Profile: Recent Labs  Lab 02/09/18 1043 02/10/18 0430  INR 1.18 1.34   Cardiac Enzymes: Recent Labs  Lab 02/09/18 2020 02/09/18 2212  TROPONINI <0.03 <0.03   BNP (last 3 results) No results for input(s): PROBNP in the last 8760 hours. HbA1C: No results for input(s): HGBA1C in the last 72 hours. CBG: Recent Labs  Lab 02/09/18 1933 02/09/18 2340 02/10/18 0323 02/10/18 0716  GLUCAP 97 86 90 118*   Lipid Profile: No results for input(s): CHOL, HDL, LDLCALC, TRIG, CHOLHDL, LDLDIRECT in the last 72 hours. Thyroid Function Tests: No results for input(s): TSH, T4TOTAL, FREET4, T3FREE, THYROIDAB in the last 72 hours. Anemia Panel: No results for input(s): VITAMINB12, FOLATE, FERRITIN, TIBC, IRON, RETICCTPCT in the last 72 hours. Sepsis Labs: Recent Labs  Lab 02/09/18 1136 02/09/18 1400 02/09/18 2020 02/10/18 0030  LATICACIDVEN 5.0* 4.1* 3.1* 1.6    Recent Results (from the past 240 hour(s))  MRSA PCR Screening     Status: None   Collection Time: 02/09/18  7:01 PM  Result Value Ref Range Status   MRSA by PCR NEGATIVE NEGATIVE Final    Comment:        The GeneXpert MRSA Assay (FDA approved for NASAL specimens only), is one component of a comprehensive MRSA colonization surveillance program. It is not intended to diagnose MRSA infection nor to guide or monitor treatment for MRSA infections. Performed at [facility], [place] [address] [place], [place] [address]          Radiology Studies: No results found.      Scheduled Meds: . sodium chloride   Intravenous Once  . bisoprolol  10 mg Oral Daily  . Chlorhexidine Gluconate Cloth  6 each Topical Daily  . gabapentin  600 mg Oral  BID  . hydrochlorothiazide  12.5 mg Oral Daily  . [START ON 02/13/2018] pantoprazole  40 mg Intravenous Q12H  . rotigotine  1 patch Transdermal Daily   Continuous Infusions: . sodium chloride 10 mL/hr (02/10/18 0430)  . octreotide  (SANDOSTATIN)    IV infusion 50 mcg/hr (02/10/18 0430)  . pantoprozole (PROTONIX) infusion 8 mg/hr (02/10/18 0905)     LOS: 0 days    Time spent: 35 minutes.     Elmarie Shiley, MD [facility] Pager [phone]  If 7PM-7AM, please contact night-coverage [email or web address] Password [email or web address] 02/10/2018, 9:12 AM

## 2018-02-10 NOTE — Brief Op Note (Signed)
02/09/2018 - 02/10/2018  10:47 AM  PATIENT:  Olivia Benson  72 y.o. female  PRE-OPERATIVE DIAGNOSIS:  hematemesis  POST-OPERATIVE DIAGNOSIS:  esophageal varices, s/p banding  x7, portal gastropathy  PROCEDURE:  Procedure(s): ESOPHAGOGASTRODUODENOSCOPY (EGD) WITH PROPOFOL (N/A) ESOPHAGEAL BANDING  SURGEON:  Surgeon(s) and Role:    Ronnette Juniper, MD - Primary  PHYSICIAN ASSISTANT:   ASSISTANTS: Burtis Junes, RN, William Dalton, Tech  ANESTHESIA:   MAC  EBL:  Minimal  BLOOD ADMINISTERED:none  DRAINS: none   LOCAL MEDICATIONS USED:  NONE  SPECIMEN:  No Specimen  DISPOSITION OF SPECIMEN:  N/A  COUNTS:  YES  TOURNIQUET:  * No tourniquets in log *  DICTATION: .Dragon Dictation  PLAN OF CARE: Admit to inpatient   PATIENT DISPOSITION:  PACU - hemodynamically stable.   Delay start of Pharmacological VTE agent (>24hrs) due to surgical blood loss or risk of bleeding: yes

## 2018-02-10 NOTE — Progress Notes (Signed)
CRITICAL VALUE ALERT  Critical Value:  Hb 6.9  Date & Time Notied:  02/10/2018 @ 3888  Provider Notified: [facility] On Call 02/10/2018 @ 0530  Orders Received/Actions taken: 02/10/2018 @ 0547 orders to transfuse 2 units PRBC.

## 2018-02-10 NOTE — Transfer of Care (Signed)
Immediate Anesthesia Transfer of Care Note  Patient: Olivia Benson  Procedure(s) Performed: ESOPHAGOGASTRODUODENOSCOPY (EGD) WITH PROPOFOL (N/A ) ESOPHAGEAL BANDING  Patient Location: PACU and Endoscopy Unit  Anesthesia Type:MAC  Level of Consciousness: awake, alert , oriented and patient cooperative  Airway & Oxygen Therapy: Patient Spontanous Breathing  Post-op Assessment: Report given to RN, Post -op Vital signs reviewed and stable and Patient moving all extremities  Post vital signs: Reviewed and stable  Last Vitals:  Vitals Value Taken Time  BP 130/84 02/10/2018 10:50 AM  Temp    Pulse 74 02/10/2018 10:51 AM  Resp 22 02/10/2018 10:51 AM  SpO2 95 % 02/10/2018 10:51 AM  Vitals shown include unvalidated device data.  Last Pain:  Vitals:   02/10/18 1022  TempSrc: Oral  PainSc:       Patients Stated Pain Goal: 0 (25/42/70 6237)  Complications: No apparent anesthesia complications

## 2018-02-10 NOTE — Anesthesia Preprocedure Evaluation (Addendum)
Anesthesia Evaluation  Patient identified by MRN, date of birth, ID band Patient awake    Reviewed: Allergy & Precautions, NPO status , Patient's Chart, lab work & pertinent test results  History of Anesthesia Complications (+) PONV, PROLONGED EMERGENCE and history of anesthetic complications  Airway Mallampati: II  TM Distance: >3 FB Neck ROM: Full    Dental  (+) Dental Advisory Given   Pulmonary shortness of breath (since starting chemotherapy) and with exertion, asthma , former smoker,    breath sounds clear to auscultation       Cardiovascular hypertension, Pt. on medications (-) angina+ Past MI   Rhythm:Regular Rate:Normal     Neuro/Psych  RLS  negative psych ROS   GI/Hepatic negative GI ROS, (+) Cirrhosis       , Hepatitis -  Endo/Other  diabetes, Type 2, Insulin Dependent, Oral Hypoglycemic Agents Obesity   Renal/GU negative Renal ROS    OAB     Musculoskeletal negative musculoskeletal ROS (+)   Abdominal   Peds  Hematology  (+) anemia ,  Thrombocytopenia    Anesthesia Other Findings   Reproductive/Obstetrics  Ovarian cancer                             Anesthesia Physical Anesthesia Plan  ASA: III  Anesthesia Plan: MAC   Post-op Pain Management:    Induction: Intravenous  PONV Risk Score and Plan: 3 and Propofol infusion and Treatment may vary due to age or medical condition  Airway Management Planned: Nasal Cannula and Natural Airway  Additional Equipment: None  Intra-op Plan:   Post-operative Plan:   Informed Consent: I have reviewed the patients History and Physical, chart, labs and discussed the procedure including the risks, benefits and alternatives for the proposed anesthesia with the patient or authorized representative who has indicated his/her understanding and acceptance.     Plan Discussed with: CRNA and Anesthesiologist  Anesthesia  Plan Comments:        Anesthesia Quick Evaluation

## 2018-02-10 NOTE — Consult Note (Addendum)
Reason for Consult: Vomiting blood Referring Physician: Dr.Mesner/ER  Olivia Benson is an 72 y.o. female.  HPI: 72 year old female was in his usual state of health until yesterday when she developed nausea and vomited blood described as coffee-ground emesis along with fresh blood and small amount of clot.  She had 2 of those episodes at home and presented to the ER where she was found to be anemic and admitted for possible variceal bleeding. 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  Since admission patient has had multiple episodes of black melanotic stools and is now receiving second unit of PRBC transfusion.  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  She is on chemotherapy via Port-A-Cath.  Patient also takes ibuprofen 2 to 3 pills on a daily basis.  Patient denies difficulty swallowing, pain on swallowing, unintentional weight loss, abdominal pain or change in bowel habits prior to presentation. Last colonoscopy was in 2007, non-adenomatous polyp removed.  Past Medical History:  Diagnosis Date  . Asthma   . Cirrhosis ([place])   . Complication of anesthesia    slow to wake up / n & v  . Diabetes mellitus   . Elevated LFTs   . Family history of adverse reaction to anesthesia    multiple family members have had problems such as slow to wake / "flat lined" /  ventilator    . Frequency of urination   . Glaucoma, narrow-angle   . History of skin cancer   . Hypertension   . Non-ST elevated myocardial infarction (non-STEMI) [facility]) Sept 2012   Mild elevation in troponin and CK MB but no evidence of coronary disease at time of cath  . Obesity   . Ovarian cancer ([place]) 11/22/2013   Disseminated ovarian cancer  . Overactive bladder   . PONV (postoperative nausea and  vomiting)   . Portal hypertension ([place])   . Restless leg syndrome   . Tingling    hands and feet    Past Surgical History:  Procedure Laterality Date  . ABDOMINAL ULTRASOUND  Sept 2012   No gallbladder disease, + fatty liver  . CARDIAC CATHETERIZATION  10/25/2010   EF 60%; Normal coronaries  . COLONOSCOPY  10/18/2005  . COSMETIC SURGERY  2003  . DENTAL SURGERY    . EYE SURGERY Bilateral 09/19/14 R eye, 08/2014 L eye   open angles in both eyes d/t narrow angle glaucoma  . LAPAROTOMY N/A 03/29/2014   Procedure: EXPLORATORY LAPAROTOMY;  Surgeon: Everitt Amber, MD;  Location: [facility]S;  Service: Gynecology;  Laterality: N/A;  . PARTIAL HYSTERECTOMY  1988  . SALPINGOOPHORECTOMY Bilateral 03/29/2014   Procedure: BILATERAL SALPINGO OOPHORECTOMY, OMENTECTOMY, DEBULKING;  Surgeon: Everitt Amber, MD;  Location: [facility];  Service: Gynecology;  Laterality: Bilateral;  . TONSILLECTOMY  1971  . TUBAL LIGATION    . Csf - [place]  11/24/2009    Family History  Problem Relation Age of Onset  . Asthma Mother   . Diabetes Mother   . Hypertension Mother   . Allergies Mother   . Rheum arthritis Mother   . Bladder Cancer Father 52       former heavy smoker  . Hypertension Father   . Stroke Father   . Breast cancer Sister 7  . Diabetes Sister        x 3  . Allergies  Sister   . Asthma Sister        x 2   . Breast cancer Maternal Aunt 85  . Breast cancer Cousin 76       (maternal) bilateral cancer  . Aneurysm Son 61  . Diabetes Brother   . Allergies Brother   . Asthma Brother     Social History:  reports that she quit smoking about 36 years ago. Her smoking use included cigarettes. She has a 1.00 pack-year smoking history. She has never used smokeless tobacco. She reports that she does not drink alcohol or use drugs.  Allergies:  Allergies  Allergen Reactions  . Shellfish Allergy Anaphylaxis    HER THROAT SWELLS UP AND CLOSES  . Benadryl [Diphenhydramine Hcl] Other (See Comments)    Restless  legs   . Penicillins Rash    DID THE REACTION INVOLVE: Swelling of the face/tongue/throat, SOB, or low BP? No Sudden or severe rash/hives, skin peeling, or the inside of the mouth or nose? No Did it require medical treatment? No When did it last happen?Childhood allergy If all above answers are "NO", may proceed with cephalosporin use.   . Tape Itching    Red itch with some tapes    Medications: I have reviewed the patient's current medications.  Results for orders placed or performed during the hospital encounter of 02/09/18 (from the past 48 hour(s))  Comprehensive metabolic panel     Status: Abnormal   Collection Time: 02/09/18 10:43 AM  Result Value Ref Range   Sodium 141 135 - 145 mmol/L   Potassium 5.7 (H) 3.5 - 5.1 mmol/L   Chloride 108 98 - 111 mmol/L   CO2 19 (L) 22 - 32 mmol/L   Glucose, Bld 141 (H) 70 - 99 mg/dL   BUN 49 (H) 8 - 23 mg/dL   Creatinine, Ser 0.81 0.44 - 1.00 mg/dL   Calcium 8.9 8.9 - 10.3 mg/dL   Total Protein 6.7 6.5 - 8.1 g/dL   Albumin 3.6 3.5 - 5.0 g/dL   AST 36 15 - 41 U/L   ALT 32 0 - 44 U/L   Alkaline Phosphatase 69 38 - 126 U/L   Total Bilirubin 1.5 (H) 0.3 - 1.2 mg/dL   GFR calc non Af Amer >60 >60 mL/min   GFR calc Af Amer >60 >60 mL/min   Anion gap 14 5 - 15    Comment: Performed at [facility], [place] [address], [place], [place] [address]  CBC     Status: Abnormal   Collection Time: 02/09/18 10:43 AM  Result Value Ref Range   WBC 10.7 (H) 4.0 - 10.5 K/uL   RBC 3.18 (L) 3.87 - 5.11 MIL/uL   Hemoglobin 9.5 (L) 12.0 - 15.0 g/dL   HCT 31.6 (L) 36.0 - 46.0 %   MCV 99.4 80.0 - 100.0 fL   MCH 29.9 26.0 - 34.0 pg   MCHC 30.1 30.0 - 36.0 g/dL   RDW 17.2 (H) 11.5 - 15.5 %   Platelets 138 (L) 150 - 400 K/uL   nRBC 0.2 0.0 - 0.2 %    Comment: Performed at [facility], [place] [address], [place],  [address]  Type and screen [place]     Status: None (Preliminary result)    Collection Time: 02/09/18 10:43 AM  Result Value Ref Range   ABO/RH(D) O NEG    Antibody Screen NEG    Sample Expiration 02/12/2018    Unit Number  [id number]    Blood Component Type RBC LR PHER2    Unit division 00    Status of Unit ISSUED    Transfusion Status OK TO TRANSFUSE    Crossmatch Result Compatible    Unit Number [id number]    Blood Component Type RBC LR PHER1    Unit division 00    Status of Unit ISSUED    Transfusion Status OK TO TRANSFUSE    Crossmatch Result      Compatible Performed at [place] [address], [place], [place] [address]   Protime-INR     Status: None   Collection Time: 02/09/18 10:43 AM  Result Value Ref Range   Prothrombin Time 14.9 11.4 - 15.2 seconds   INR 1.18     Comment: Performed at [facility], [place] [address], [place], [place] [address]  Lactic acid, plasma     Status: Abnormal   Collection Time: 02/09/18 11:36 AM  Result Value Ref Range   Lactic Acid, Venous 5.0 (HH) 0.5 - 1.9 mmol/L    Comment: CRITICAL RESULT CALLED TO, READ BACK BY AND VERIFIED WITH: S.WEST RN AT 1219 ON 02/09/18 BY S.VANHOORNE Performed at [facility], [place] [address], [place], [place] [address]   Lactic acid, plasma     Status: Abnormal   Collection Time: 02/09/18  2:00 PM  Result Value Ref Range   Lactic Acid, Venous 4.1 (HH) 0.5 - 1.9 mmol/L    Comment: CRITICAL RESULT CALLED TO, READ BACK BY AND VERIFIED WITH: Jamal Maes [id number] @ 3818 BY J SCOTTON Performed at [place] [address], [place], [place] [address]   MRSA PCR Screening     Status: None   Collection Time: 02/09/18  7:01 PM  Result Value Ref Range   MRSA by PCR NEGATIVE NEGATIVE    Comment:        The GeneXpert MRSA Assay (FDA approved for NASAL specimens only), is one component of a comprehensive MRSA colonization surveillance program. It is not intended to diagnose MRSA infection nor to  guide or monitor treatment for MRSA infections. Performed at [facility], [place] [address] [place], [place] [address]   Glucose, capillary     Status: None   Collection Time: 02/09/18  7:33 PM  Result Value Ref Range   Glucose-Capillary 97 70 - 99 mg/dL  Lactic acid, plasma     Status: Abnormal   Collection Time: 02/09/18  8:20 PM  Result Value Ref Range   Lactic Acid, Venous 3.1 (HH) 0.5 - 1.9 mmol/L    Comment: CRITICAL RESULT CALLED TO, READ BACK BY AND VERIFIED WITH: MORRIS,H RN AT 2100 02/09/18 BY TIBBITTS,K Performed at [facility], [place] [address], [place], [place] [address]   Renal function panel     Status: Abnormal   Collection Time: 02/09/18  8:20 PM  Result Value Ref Range   Sodium 141 135 - 145 mmol/L   Potassium 4.9 3.5 - 5.1 mmol/L   Chloride 111 98 - 111 mmol/L   CO2 20 (L) 22 - 32 mmol/L   Glucose, Bld 166 (H) 70 - 99 mg/dL   BUN 49 (H) 8 - 23 mg/dL   Creatinine, Ser 0.97 0.44 - 1.00 mg/dL   Calcium 8.7 (L) 8.9 - 10.3 mg/dL   Phosphorus 3.9 2.5 - 4.6 mg/dL   Albumin 3.1 (L) 3.5 - 5.0 g/dL   GFR calc non Af Amer 59 (L) >60  mL/min   GFR calc Af Amer >60 >60 mL/min   Anion gap 10 5 - 15    Comment: Performed at [facility], [place] [address], [place], [place] [address]  Magnesium     Status: Abnormal   Collection Time: 02/09/18  8:20 PM  Result Value Ref Range   Magnesium 1.0 (L) 1.7 - 2.4 mg/dL    Comment: Performed at [facility], [place] [address] [place], [place] [address]  Phosphorus     Status: None   Collection Time: 02/09/18  8:20 PM  Result Value Ref Range   Phosphorus 4.0 2.5 - 4.6 mg/dL    Comment: Performed at [facility], [place] [address], [place], [place] [address]  Hemoglobin     Status: Abnormal   Collection Time: 02/09/18  8:20 PM  Result Value Ref Range   Hemoglobin 8.0 (L) 12.0 - 15.0 g/dL    Comment: Performed at [facility],  [place] [address] [place], [place] [address]  Hematocrit     Status: Abnormal   Collection Time: 02/09/18  8:20 PM  Result Value Ref Range   HCT 26.4 (L) 36.0 - 46.0 %    Comment: Performed at [facility], [place] [address] [place], [place] [address]  Troponin I - Now Then Q3H     Status: None   Collection Time: 02/09/18  8:20 PM  Result Value Ref Range   Troponin I <0.03 <0.03 ng/mL    Comment: Performed at [facility], [place] [address] [place], [place] [address]  Troponin I - Now Then Q3H     Status: None   Collection Time: 02/09/18 10:12 PM  Result Value Ref Range   Troponin I <0.03 <0.03 ng/mL    Comment: Performed at [facility], [place] [address], [place], [place] [address]  Glucose, capillary     Status: None   Collection Time: 02/09/18 11:40 PM  Result Value Ref Range   Glucose-Capillary 86 70 - 99 mg/dL  Lactic acid, plasma     Status: None   Collection Time: 02/10/18 12:30 AM  Result Value Ref Range   Lactic Acid, Venous 1.6 0.5 - 1.9 mmol/L    Comment: Performed at [facility], [place] [address] [place], [place] [address]  Renal function panel     Status: Abnormal   Collection Time: 02/10/18  1:33 AM  Result Value Ref Range   Sodium 143 135 - 145 mmol/L   Potassium 4.3 3.5 - 5.1 mmol/L   Chloride 112 (H) 98 - 111 mmol/L   CO2 23 22 - 32 mmol/L   Glucose, Bld 98 70 - 99 mg/dL   BUN 48 (H) 8 - 23 mg/dL   Creatinine, Ser 0.92 0.44 - 1.00 mg/dL   Calcium 8.2 (L) 8.9 - 10.3 mg/dL   Phosphorus 4.9 (H) 2.5 - 4.6 mg/dL   Albumin 2.8 (L) 3.5 - 5.0 g/dL   GFR calc non Af Amer >60 >60 mL/min   GFR calc Af Amer >60 >60 mL/min   Anion gap 8 5 - 15    Comment: Performed at [facility], [place] [address] [place], [place] [address]  Glucose, capillary     Status: None   Collection Time: 02/10/18  3:23 AM  Result Value Ref Range   Glucose-Capillary 90 70 - 99 mg/dL  Comprehensive metabolic  panel     Status: Abnormal   Collection Time: 02/10/18  4:30 AM  Result Value Ref Range   Sodium 145 135 - 145 mmol/L   Potassium 4.4 3.5 - 5.1 mmol/L   Chloride 114 (H) 98 - 111 mmol/L   CO2 23 22 - 32 mmol/L   Glucose, Bld 103 (H) 70 - 99 mg/dL   BUN 45 (H) 8 - 23 mg/dL   Creatinine, Ser 1.00 0.44 - 1.00 mg/dL   Calcium 8.2 (L) 8.9 - 10.3 mg/dL   Total Protein 5.4 (L) 6.5 - 8.1 g/dL   Albumin 2.8 (L) 3.5 - 5.0 g/dL   AST 29 15 - 41 U/L   ALT 26 0 - 44 U/L   Alkaline Phosphatase 45 38 - 126 U/L   Total Bilirubin 0.7 0.3 - 1.2 mg/dL   GFR calc non Af Amer 57 (L) >60 mL/min   GFR calc Af Amer >60 >60 mL/min   Anion gap 8 5 - 15    Comment: Performed at [facility], [place] [address] [place], [place] [address]  Protime-INR     Status: Abnormal   Collection Time: 02/10/18  4:30 AM  Result Value Ref Range   Prothrombin Time 16.5 (H) 11.4 - 15.2 seconds   INR 1.34     Comment: Performed at [facility], [place] [address], [place], [place] [address]  CBC     Status: Abnormal   Collection Time: 02/10/18  4:30 AM  Result Value Ref Range   WBC 5.3 4.0 - 10.5 K/uL   RBC 2.30 (L) 3.87 - 5.11 MIL/uL   Hemoglobin 6.9 (LL) 12.0 - 15.0 g/dL    Comment: This critical result has verified and been called to [place] by Raelyn Ensign on 12 31 2019 at 0529, and has been read back. CRITICAL RESULT VERIFIED. CHECKED RESULT.   HCT 22.8 (L) 36.0 - 46.0 %   MCV 99.1 80.0 - 100.0 fL   MCH 30.0 26.0 - 34.0 pg   MCHC 30.3 30.0 - 36.0 g/dL   RDW 17.4 (H) 11.5 - 15.5 %   Platelets 88 (L) 150 - 400 K/uL    Comment: Immature Platelet Fraction may be clinically indicated, consider ordering this additional test [id number]    nRBC 0.0 0.0 - 0.2 %    Comment: Performed at [facility], [place] [address], [place], [place] [address]  Prepare RBC     Status: None   Collection Time: 02/10/18  6:30 AM  Result Value Ref Range   Order Confirmation      ORDER  PROCESSED BY BLOOD BANK Performed at [facility], [place] [address] [place], [place] [address]   Glucose, capillary     Status: Abnormal   Collection Time: 02/10/18  7:16 AM  Result Value Ref Range   Glucose-Capillary 118 (H) 70 - 99 mg/dL    No results found.  Review of Systems  Constitutional: Negative.   HENT: Negative.   Eyes: Negative.   Respiratory: Negative.   Cardiovascular: Negative.   Gastrointestinal: Positive for heartburn, melena, nausea and vomiting. Negative for abdominal pain, constipation and diarrhea.  Genitourinary: Negative.   Musculoskeletal: Negative.   Skin: Negative.   Neurological: Negative.   Endo/Heme/Allergies: Negative.   Psychiatric/Behavioral: Negative.    Blood pressure (!) 128/49, pulse 66, temperature 97.8 F (36.6 C), temperature source Oral, resp. rate 14, height 5' 2"  (1.575 m), weight 84.2 kg, SpO2 99 %. Physical Exam  Constitutional: She is oriented to person, place, and time. She appears well-developed  and well-nourished.  HENT:  Head: Normocephalic and atraumatic.  Eyes: Conjunctivae are normal.  Neck: Normal range of motion. Neck supple.  Cardiovascular: Normal rate and regular rhythm.  Respiratory: Effort normal and breath sounds normal.  GI: Soft. Bowel sounds are normal.  Musculoskeletal: Normal range of motion.  Neurological: She is alert and oriented to person, place, and time.  Skin: Skin is warm.  Psychiatric: She has a normal mood and affect.    Assessment/Plan: Hematemesis and melena with history of cirrhosis of liver and NSAID use. This could be esophageal variceal bleeding or bleeding from peptic ulcer disease. Patient appears hemodynamically stable, but has blood loss anemia and is receiving second unit PRBC transfusion. She is on IV octreotide as well as IV Protonix. Plan EGD now with possible banding or treatment for ulcer depending upon findings. The risks and the benefits of the procedure were  discussed with the patient in details.  She understands and verbalizes consent.   Ronnette Juniper 02/10/2018, 10:17 AM

## 2018-02-10 NOTE — Progress Notes (Signed)
IV Team at bedside to assess for PIV for blood transfusion.

## 2018-02-11 ENCOUNTER — Encounter (HOSPITAL_COMMUNITY): Payer: Self-pay | Admitting: Gastroenterology

## 2018-02-11 LAB — GLUCOSE, CAPILLARY
Glucose-Capillary: 106 mg/dL — ABNORMAL HIGH (ref 70–99)
Glucose-Capillary: 117 mg/dL — ABNORMAL HIGH (ref 70–99)
Glucose-Capillary: 124 mg/dL — ABNORMAL HIGH (ref 70–99)
Glucose-Capillary: 127 mg/dL — ABNORMAL HIGH (ref 70–99)
Glucose-Capillary: 160 mg/dL — ABNORMAL HIGH (ref 70–99)
Glucose-Capillary: 89 mg/dL (ref 70–99)

## 2018-02-11 LAB — BPAM RBC
Blood Product Expiration Date: 202001282359
Blood Product Expiration Date: 202001282359
ISSUE DATE / TIME: 201912310748
ISSUE DATE / TIME: 201912310958
Unit Type and Rh: 9500
Unit Type and Rh: 9500

## 2018-02-11 LAB — CBC
HCT: 30.9 % — ABNORMAL LOW (ref 36.0–46.0)
Hemoglobin: 9.8 g/dL — ABNORMAL LOW (ref 12.0–15.0)
MCH: 29.8 pg (ref 26.0–34.0)
MCHC: 31.7 g/dL (ref 30.0–36.0)
MCV: 93.9 fL (ref 80.0–100.0)
Platelets: 99 10*3/uL — ABNORMAL LOW (ref 150–400)
RBC: 3.29 MIL/uL — ABNORMAL LOW (ref 3.87–5.11)
RDW: 19.1 % — ABNORMAL HIGH (ref 11.5–15.5)
WBC: 7.1 10*3/uL (ref 4.0–10.5)
nRBC: 0 % (ref 0.0–0.2)

## 2018-02-11 LAB — TYPE AND SCREEN
ABO/RH(D): O NEG
Antibody Screen: NEGATIVE
Unit division: 0
Unit division: 0

## 2018-02-11 NOTE — Progress Notes (Signed)
[facility] Gastroenterology Progress Note  Olivia Benson 73 y.o. [birth year]  CC: GI bleed   Subjective: No further bleeding episodes.  Tolerating clear liquids diet.  Complaining of some substernal discomfort with liquids.  Denies abdominal pain.     Objective: Vital signs in last 24 hours: Vitals:   02/11/18 0810 02/11/18 1052  BP:  137/67  Pulse:  64  Resp:  14  Temp: 98.1 F (36.7 C) 98.2 F (36.8 C)  SpO2:  96%    Physical Exam:  General.  Alert/oriented x3.  Not in acute distress ABD :  soft, nontender, nondistended, bowel sounds present.  No peritoneal signs  Lab Results: Recent Labs    02/09/18 2020 02/10/18 0133 02/10/18 0430 02/10/18 1525  NA 141 143 145 142  K 4.9 4.3 4.4 4.0  CL 111 112* 114* 112*  CO2 20* 23 23 21*  GLUCOSE 166* 98 103* 115*  BUN 49* 48* 45* 36*  CREATININE 0.97 0.92 1.00 1.00  CALCIUM 8.7* 8.2* 8.2* 8.0*  MG 1.0*  --   --   --   PHOS 4.0  3.9 4.9*  --  4.7*   Recent Labs    02/09/18 1043  02/10/18 0430 02/10/18 1525  AST 36  --  29  --   ALT 32  --  26  --   ALKPHOS 69  --  45  --   BILITOT 1.5*  --  0.7  --   PROT 6.7  --  5.4*  --   ALBUMIN 3.6   < > 2.8* 3.1*   < > = values in this interval not displayed.   Recent Labs    02/10/18 0430  02/10/18 2014 02/11/18 0400  WBC 5.3  --   --  7.1  HGB 6.9*   < > 9.4* 9.8*  HCT 22.8*   < > 30.0* 30.9*  MCV 99.1  --   --  93.9  PLT 88*  --   --  99*   < > = values in this interval not displayed.   Recent Labs    02/09/18 1043 02/10/18 0430  LABPROT 14.9 16.5*  INR 1.18 1.34      Assessment/Plan: -Variceal bleeding.  Status post EGD with band ligation yesterday.  No further bleeding episodes.  BUN trending down.  Hemoglobin stable - Cirrhosis .  Most likely from [place].  Recommendations ------------------------ - Advance diet to full liquid. - Continue octreotide for total 72 hours.  Continue antibiotics while in the hospital. -Consider low-dose beta-blocker on  discharge for secondary prophylaxis.  Her heart rate currently in 60s. -Monitor H&H.  GI will follow   Otis Brace MD, [place] 02/11/2018, 1:08 PM  Contact #  [phone]

## 2018-02-11 NOTE — Progress Notes (Signed)
PROGRESS NOTE    Olivia Benson  FVC:[id number] DOB: 07-Jan-1946 DOA: 02/09/2018 PCP: Helane Rima, MD    Brief Narrative: 73 year old with past medical history significant for liver cirrhosis secondary to [name], ovarian cancer with peritoneal carcinomatosis, restless leg syndrome, portal hypertension, history of non-STEMI, diabetes, asthma who presented with hematemesis and dark-colored red blood per rectum.  Patient was also having some abdominal pain.  On admission lactic acid was at 5, bilirubin 1.5 platelet count 138 hemoglobin dropped from 11-9. Patient was a started on IV Protonix, octreotide.  GI has been consulted and plan is for endoscopy on 02/10/2018.  Assessment & Plan:   Active Problems:   GIB (gastrointestinal bleeding)   Acute GI bleed, upper GI bleed likely variceal bleed; Patient received 2 units of packed red blood cell. Endoscopy showed esophageal varices grade III, S/p band ligation.  Continue with octreotide for 72 hour per GI/  Continue with ceftriaxone.  Hb increase to 9 post transfusion.  Transfer to med-surgery.  Advance diet today   History of cirrhosis secondary to [facility]; Continue with octreotide. Continue with ceftriaxone  for SBP prophylaxis. On bisoprolol/   Thrombocytopenia; likely secondary to liver disease.  Follow trend.  Diabetes; sliding scale insulin.  History of non-STEMI; Denies chest pain  Hypertension; Continue with hydrochlorothiazide, bisoprolol.  PRN Hydralazine.  Improved.   Ovarian cancer, with peritoneal carcinomatosis.  Follow with oncologist   Estimated body mass index is 33.95 kg/m as calculated from the following:   Height as of this encounter: 5' 2"  (1.575 m).   Weight as of this encounter: 84.2 kg.   DVT prophylaxis: SCD Code Status: Full code Family Communication: husband at bedside.  Disposition Plan: remain in the hospital for procedure and cycle hb.   Consultants:   GI   Procedures:   Endoscopy      Antimicrobials:   cipro   Subjective: Report some discomfort , chest post esophageal band.    Objective: Vitals:   02/11/18 0800 02/11/18 0810 02/11/18 1052 02/11/18 1338  BP: (!) 143/49  137/67 (!) 157/68  Pulse: 69  64 67  Resp: 17  14 18   Temp:  98.1 F (36.7 C) 98.2 F (36.8 C) 98.6 F (37 C)  TempSrc:  Oral Oral Oral  SpO2: 98%  96% 97%  Weight:      Height:        Intake/Output Summary (Last 24 hours) at 02/11/2018 1418 Last data filed at 02/11/2018 6384 Gross per 24 hour  Intake 1017.62 ml  Output -  Net 1017.62 ml   Filed Weights   02/09/18 1954 02/10/18 0925  Weight: 84.2 kg 84.2 kg    Examination:  General exam: NAD Respiratory system: CTA Cardiovascular system: S 1, S 2 RRR Gastrointestinal system: BS , present, soft, nt Central nervous system: non focal.  Extremities:  Symmetric power.   Data Reviewed: I have personally reviewed following labs and imaging studies  CBC: Recent Labs  Lab 02/09/18 1043 02/09/18 2020 02/10/18 0430 02/10/18 1525 02/10/18 2014 02/11/18 0400  WBC 10.7*  --  5.3  --   --  7.1  HGB 9.5* 8.0* 6.9* 9.7* 9.4* 9.8*  HCT 31.6* 26.4* 22.8* 31.1* 30.0* 30.9*  MCV 99.4  --  99.1  --   --  93.9  PLT 138*  --  88*  --   --  99*   Basic Metabolic Panel: Recent Labs  Lab 02/09/18 1043 02/09/18 2020 02/10/18 0133 02/10/18 0430 02/10/18 1525  NA 141  141 143 145 142  K 5.7* 4.9 4.3 4.4 4.0  CL 108 111 112* 114* 112*  CO2 19* 20* 23 23 21*  GLUCOSE 141* 166* 98 103* 115*  BUN 49* 49* 48* 45* 36*  CREATININE 0.81 0.97 0.92 1.00 1.00  CALCIUM 8.9 8.7* 8.2* 8.2* 8.0*  MG  --  1.0*  --   --   --   PHOS  --  4.0  3.9 4.9*  --  4.7*   GFR: Estimated Creatinine Clearance: 52.6 mL/min (by C-G formula based on SCr of 1 mg/dL). Liver Function Tests: Recent Labs  Lab 02/09/18 1043 02/09/18 2020 02/10/18 0133 02/10/18 0430 02/10/18 1525  AST 36  --   --  29  --   ALT 32  --   --  26  --   ALKPHOS 69  --   --  45   --   BILITOT 1.5*  --   --  0.7  --   PROT 6.7  --   --  5.4*  --   ALBUMIN 3.6 3.1* 2.8* 2.8* 3.1*   No results for input(s): LIPASE, AMYLASE in the last 168 hours. No results for input(s): AMMONIA in the last 168 hours. Coagulation Profile: Recent Labs  Lab 02/09/18 1043 02/10/18 0430  INR 1.18 1.34   Cardiac Enzymes: Recent Labs  Lab 02/09/18 2020 02/09/18 2212  TROPONINI <0.03 <0.03   BNP (last 3 results) No results for input(s): PROBNP in the last 8760 hours. HbA1C: No results for input(s): HGBA1C in the last 72 hours. CBG: Recent Labs  Lab 02/10/18 1641 02/10/18 1952 02/10/18 2350 02/11/18 0407 02/11/18 0804  GLUCAP 113* 132* 117* 106* 124*   Lipid Profile: No results for input(s): CHOL, HDL, LDLCALC, TRIG, CHOLHDL, LDLDIRECT in the last 72 hours. Thyroid Function Tests: No results for input(s): TSH, T4TOTAL, FREET4, T3FREE, THYROIDAB in the last 72 hours. Anemia Panel: No results for input(s): VITAMINB12, FOLATE, FERRITIN, TIBC, IRON, RETICCTPCT in the last 72 hours. Sepsis Labs: Recent Labs  Lab 02/09/18 1136 02/09/18 1400 02/09/18 2020 02/10/18 0030  LATICACIDVEN 5.0* 4.1* 3.1* 1.6    Recent Results (from the past 240 hour(s))  MRSA PCR Screening     Status: None   Collection Time: 02/09/18  7:01 PM  Result Value Ref Range Status   MRSA by PCR NEGATIVE NEGATIVE Final    Comment:        The GeneXpert MRSA Assay (FDA approved for NASAL specimens only), is one component of a comprehensive MRSA colonization surveillance program. It is not intended to diagnose MRSA infection nor to guide or monitor treatment for MRSA infections. Performed at [facility], [place] [address] [place], [place] [address]          Radiology Studies: No results found.      Scheduled Meds: . bisoprolol  10 mg Oral Daily  . Chlorhexidine Gluconate Cloth  6 each Topical Daily  . gabapentin  600 mg Oral BID  . hydrochlorothiazide  12.5 mg  Oral Daily  . pantoprazole  40 mg Intravenous q morning - 10a  . rotigotine  1 patch Transdermal Daily   Continuous Infusions: . sodium chloride    . sodium chloride Stopped (02/11/18 0441)  . cefTRIAXone (ROCEPHIN)  IV Stopped (02/10/18 1559)  . lactated ringers    . octreotide  (SANDOSTATIN)    IV infusion 50 mcg/hr (02/11/18 1006)     LOS: 1 day    Time spent: 35 minutes.  Elmarie Shiley, MD [facility] Pager [phone]  If 7PM-7AM, please contact night-coverage [email or web address] Password [email or web address] 02/11/2018, 2:18 PM

## 2018-02-12 LAB — CBC
HCT: 29.5 % — ABNORMAL LOW (ref 36.0–46.0)
Hemoglobin: 9 g/dL — ABNORMAL LOW (ref 12.0–15.0)
MCH: 29.5 pg (ref 26.0–34.0)
MCHC: 30.5 g/dL (ref 30.0–36.0)
MCV: 96.7 fL (ref 80.0–100.0)
Platelets: 82 10*3/uL — ABNORMAL LOW (ref 150–400)
RBC: 3.05 MIL/uL — ABNORMAL LOW (ref 3.87–5.11)
RDW: 18.4 % — ABNORMAL HIGH (ref 11.5–15.5)
WBC: 5.2 10*3/uL (ref 4.0–10.5)
nRBC: 0 % (ref 0.0–0.2)

## 2018-02-12 LAB — BASIC METABOLIC PANEL
Anion gap: 9 (ref 5–15)
BUN: 18 mg/dL (ref 8–23)
CO2: 24 mmol/L (ref 22–32)
Calcium: 8.1 mg/dL — ABNORMAL LOW (ref 8.9–10.3)
Chloride: 111 mmol/L (ref 98–111)
Creatinine, Ser: 0.81 mg/dL (ref 0.44–1.00)
GFR calc Af Amer: >60 mL/min (ref >60)
GFR calc non Af Amer: >60 mL/min (ref >60)
Glucose, Bld: 118 mg/dL — ABNORMAL HIGH (ref 70–99)
Potassium: 4 mmol/L (ref 3.5–5.1)
Sodium: 144 mmol/L (ref 135–145)

## 2018-02-12 LAB — GLUCOSE, CAPILLARY
Glucose-Capillary: 116 mg/dL — ABNORMAL HIGH (ref 70–99)
Glucose-Capillary: 128 mg/dL — ABNORMAL HIGH (ref 70–99)
Glucose-Capillary: 118 mg/dL — ABNORMAL HIGH (ref 70–99)

## 2018-02-12 MED ORDER — PROPRANOLOL HCL 20 MG PO TABS: 20.0000 mg | ORAL_TABLET | Freq: Two times a day (BID) | ORAL | 0 refills | Status: DC

## 2018-02-12 MED ORDER — HEPARIN SOD (PORK) LOCK FLUSH 100 UNIT/ML IV SOLN
500.0000 [IU] | INTRAVENOUS | Status: AC | PRN
  Administered 2018-02-12: 500 [IU]

## 2018-02-12 MED ORDER — PROPRANOLOL HCL 20 MG PO TABS
20.0000 mg | ORAL_TABLET | Freq: Two times a day (BID) | ORAL | Status: DC
  Administered 2018-02-12: 20 mg via ORAL
  Filled 2018-02-12: qty 1

## 2018-02-12 MED ORDER — PANTOPRAZOLE SODIUM 40 MG PO TBEC: 40.0000 mg | DELAYED_RELEASE_TABLET | Freq: Every day | ORAL | Status: DC

## 2018-02-12 NOTE — Progress Notes (Signed)
Subjective: Was seen and examined at bedside. Complains of mild epigastric pain since EGD with banding. Has not had a bowel movement in 2 days.  Objective: Vital signs in last 24 hours: Temp:  [98 F (36.7 C)-98.6 F (37 C)] 98.2 F (36.8 C) (01/02 0548) Pulse Rate:  [62-68] 62 (01/02 0548) Resp:  [14-18] 18 (01/02 0548) BP: (137-157)/(66-71) 144/66 (01/02 0548) SpO2:  [93 %-98 %] 98 % (01/02 0548) Weight change:  Last BM Date: 02/10/18  LY:YTKP pallor GENERAL:not in distress ABDOMEN:soft, nondistended, normoactive bowel sounds EXTREMITIES:no edema  Lab Results: Results for orders placed or performed during the hospital encounter of 02/09/18 (from the past 48 hour(s))  Glucose, capillary     Status: Abnormal   Collection Time: 02/10/18 12:32 PM  Result Value Ref Range   Glucose-Capillary 123 (H) 70 - 99 mg/dL  Renal function panel     Status: Abnormal   Collection Time: 02/10/18  3:25 PM  Result Value Ref Range   Sodium 142 135 - 145 mmol/L   Potassium 4.0 3.5 - 5.1 mmol/L   Chloride 112 (H) 98 - 111 mmol/L   CO2 21 (L) 22 - 32 mmol/L   Glucose, Bld 115 (H) 70 - 99 mg/dL   BUN 36 (H) 8 - 23 mg/dL   Creatinine, Ser 1.00 0.44 - 1.00 mg/dL   Calcium 8.0 (L) 8.9 - 10.3 mg/dL   Phosphorus 4.7 (H) 2.5 - 4.6 mg/dL   Albumin 3.1 (L) 3.5 - 5.0 g/dL   GFR calc non Af Amer 57 (L) >60 mL/min   GFR calc Af Amer >60 >60 mL/min   Anion gap 9 5 - 15    Comment: Performed at [facility], [place] [address], [place], [place] [address]  Hemoglobin     Status: Abnormal   Collection Time: 02/10/18  3:25 PM  Result Value Ref Range   Hemoglobin 9.7 (L) 12.0 - 15.0 g/dL    Comment: POST TRANSFUSION SPECIMEN Performed at [place] [address], [place], [place] [address]   Hematocrit     Status: Abnormal   Collection Time: 02/10/18  3:25 PM  Result Value Ref Range   HCT 31.1 (L) 36.0 - 46.0 %    Comment: Performed at [facility], [place] [address], [place], [place] [address]  Glucose, capillary     Status: Abnormal   Collection Time: 02/10/18  4:41 PM  Result Value Ref Range   Glucose-Capillary 113 (H) 70 - 99 mg/dL  Glucose, capillary     Status: Abnormal   Collection Time: 02/10/18  7:52 PM  Result Value Ref Range   Glucose-Capillary 132 (H) 70 - 99 mg/dL   Comment 1 Notify RN    Comment 2 Document in Chart   Hemoglobin     Status: Abnormal   Collection Time: 02/10/18  8:14 PM  Result Value Ref Range   Hemoglobin 9.4 (L) 12.0 - 15.0 g/dL    Comment: Performed at [facility], [place] [address], [place], [place] [address]  Hematocrit     Status: Abnormal   Collection Time: 02/10/18  8:14 PM  Result Value Ref Range   HCT 30.0 (L) 36.0 - 46.0 %    Comment: Performed at [facility], [place] [address] [place], [place] [address]  Glucose, capillary     Status: Abnormal   Collection Time: 02/10/18 11:50 PM  Result Value Ref Range   Glucose-Capillary 117 (H) 70 -  99 mg/dL   Comment 1 Notify RN    Comment 2 Document in Chart   CBC     Status: Abnormal   Collection Time: 02/11/18  4:00 AM  Result Value Ref Range   WBC 7.1 4.0 - 10.5 K/uL   RBC 3.29 (L) 3.87 - 5.11 MIL/uL   Hemoglobin 9.8 (L) 12.0 - 15.0 g/dL   HCT 30.9 (L) 36.0 - 46.0 %   MCV 93.9 80.0 - 100.0 fL   MCH 29.8 26.0 - 34.0 pg   MCHC 31.7 30.0 - 36.0 g/dL   RDW 19.1 (H) 11.5 - 15.5 %   Platelets 99 (L) 150 - 400 K/uL    Comment: REPEATED TO VERIFY PLATELET COUNT CONFIRMED BY SMEAR SPECIMEN CHECKED FOR CLOTS Immature Platelet Fraction may be clinically indicated, consider ordering this additional test [id number]    nRBC 0.0 0.0 - 0.2 %    Comment: Performed at [facility], [place] [address], [place], [place] [address]  Glucose, capillary     Status: Abnormal   Collection Time: 02/11/18  4:07 AM  Result Value Ref Range   Glucose-Capillary 106 (H) 70 - 99 mg/dL   Comment 1 Notify RN     Comment 2 Document in Chart   Glucose, capillary     Status: Abnormal   Collection Time: 02/11/18  8:04 AM  Result Value Ref Range   Glucose-Capillary 124 (H) 70 - 99 mg/dL   Comment 1 Notify RN    Comment 2 Document in Chart   Glucose, capillary     Status: Abnormal   Collection Time: 02/11/18 11:59 AM  Result Value Ref Range   Glucose-Capillary 127 (H) 70 - 99 mg/dL  Glucose, capillary     Status: Abnormal   Collection Time: 02/11/18  4:31 PM  Result Value Ref Range   Glucose-Capillary 117 (H) 70 - 99 mg/dL  Glucose, capillary     Status: Abnormal   Collection Time: 02/11/18  7:50 PM  Result Value Ref Range   Glucose-Capillary 160 (H) 70 - 99 mg/dL  Glucose, capillary     Status: None   Collection Time: 02/11/18 11:41 PM  Result Value Ref Range   Glucose-Capillary 89 70 - 99 mg/dL  Glucose, capillary     Status: Abnormal   Collection Time: 02/12/18  4:01 AM  Result Value Ref Range   Glucose-Capillary 116 (H) 70 - 99 mg/dL  CBC     Status: Abnormal   Collection Time: 02/12/18  4:21 AM  Result Value Ref Range   WBC 5.2 4.0 - 10.5 K/uL   RBC 3.05 (L) 3.87 - 5.11 MIL/uL   Hemoglobin 9.0 (L) 12.0 - 15.0 g/dL   HCT 29.5 (L) 36.0 - 46.0 %   MCV 96.7 80.0 - 100.0 fL   MCH 29.5 26.0 - 34.0 pg   MCHC 30.5 30.0 - 36.0 g/dL   RDW 18.4 (H) 11.5 - 15.5 %   Platelets 82 (L) 150 - 400 K/uL    Comment: REPEATED TO VERIFY Immature Platelet Fraction may be clinically indicated, consider ordering this additional test [id number] CONSISTENT WITH PREVIOUS RESULT    nRBC 0.0 0.0 - 0.2 %    Comment: Performed at [facility], [place] [address] [place], [place] [address]  Basic metabolic panel     Status: Abnormal   Collection Time: 02/12/18  4:21 AM  Result Value Ref Range   Sodium 144 135 - 145 mmol/L   Potassium 4.0 3.5 -  5.1 mmol/L   Chloride 111 98 - 111 mmol/L   CO2 24 22 - 32 mmol/L   Glucose, Bld 118 (H) 70 - 99 mg/dL   BUN 18 8 - 23 mg/dL   Creatinine, Ser  0.81 0.44 - 1.00 mg/dL   Calcium 8.1 (L) 8.9 - 10.3 mg/dL   GFR calc non Af Amer >60 >60 mL/min   GFR calc Af Amer >60 >60 mL/min   Anion gap 9 5 - 15    Comment: Performed at [facility], [place] [address], [place], [place] [address]  Glucose, capillary     Status: Abnormal   Collection Time: 02/12/18  7:43 AM  Result Value Ref Range   Glucose-Capillary 128 (H) 70 - 99 mg/dL    Studies/Results: No results found.  Medications: I have reviewed the patient's current medications.  Assessment: Hematemesis and melena secondary to bleeding from esophageal varices, 7 bands placed. 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 Discontinue IV octreotide. Propanolol 20 mg twice a day, to be continued if systolic blood pressure is above 90 mmHg, keep heart rate being 55-60. Okay to discharge from GI standpoint. Will arrange for outpatient EGD for repeat banding of esophageal varices in 6 weeks.   Ronnette Juniper 02/12/2018, 8:23 AM   Pager [phone] If no answer or after 5 PM call [phone]

## 2018-02-12 NOTE — Progress Notes (Addendum)
Olivia Doyne, MD was paged regarding the pt's question about resuming Chemo tx tomorrow. Also, how long the pt needs to be on a soft diet.    Olivia Doyne, MD returned my page and advised that the pt can continue chemo tx and to stay on a soft diet for two days. Pt has been informed.

## 2018-02-12 NOTE — Care Management Important Message (Signed)
Important Message  Patient Details  Name: Olivia Benson MRN: [id number] Date of Birth: [birth year]   Medicare Important Message Given:  Yes    Kerin Salen 02/12/2018, 9:53 AMImportant Message  Patient Details  Name: Olivia Benson MRN: [id number] Date of Birth: February 10, 1946   Medicare Important Message Given:  Yes    Kerin Salen 02/12/2018, 9:53 AM

## 2018-02-12 NOTE — Progress Notes (Signed)
At 1250, the pt was provided with d/c instructions and prescriptions. After discussing the pt's plan of care upon d/c, the pt reported no further questions or concerns.

## 2018-02-12 NOTE — Discharge Summary (Addendum)
Physician Discharge Summary  Olivia Benson MKL:[id number] DOB: 1946/01/26 DOA: 02/09/2018  PCP: Helane Rima, MD  Admit date: 02/09/2018 Discharge date: 02/12/2018  Admitted From: Home  Disposition:  Home  Recommendations for Outpatient Follow-up:  1. Follow up with PCP in 1-2 weeks 2. Please obtain BMP/CBC in one week 3. Follow with GI for repeat endoscopy    Discharge Condition: Stable.  CODE STATUS: full code.  Diet recommendation: Heart Healthy   Brief/Interim Summary: Brief Narrative: 73 year old with past medical history significant for liver cirrhosis secondary to [name], ovarian cancer with peritoneal carcinomatosis, restless leg syndrome, portal hypertension, history of non-STEMI, diabetes, asthma who presented with hematemesis and dark-colored red blood per rectum.  Patient was also having some abdominal pain.  On admission lactic acid was at 5, bilirubin 1.5 platelet count 138 hemoglobin dropped from 11-9. Patient was a started on IV Protonix, octreotide.  GI has been consulted and plan is for endoscopy on 02/10/2018.  Acute GI bleed, upper GI bleed likely variceal bleed; Patient received 2 units of packed red blood cell. Endoscopy showed esophageal varices grade III, S/p band ligation.  Continue with octreotide for 72 hour per GI/  Continue with ceftriaxone.  Hb stable. Tolerating diet.  Plan to discharge today. Needs to follow with GI for repeat endoscopy    History of cirrhosis secondary to [facility]; Continue with octreotide. Continue with ceftriaxone  for SBP prophylaxis. Bisoprolol change to propanolol/   Thrombocytopenia; likely secondary to liver disease.  Follow trend.  Diabetes; sliding scale insulin.  History of non-STEMI; Denies chest pain  Hypertension; Continue with hydrochlorothiazide, bisoprolol.  PRN Hydralazine.  Improved.   Ovarian cancer, with peritoneal carcinomatosis.  Follow with oncologist   Obesity;  Estimated body mass  index is 33.95 kg/m as calculated from the following:   Height as of this encounter: 5' 2"  (1.575 m).   Weight as of this encounter: 84.2 kg.  Discharge Diagnoses:    GIB (gastrointestinal bleeding)   Essential hypertension   Disseminated ovarian cancer (HCC)   Liver cirrhosis secondary to NASH ([place])   Thrombocytopenia [facility])     Discharge Instructions  Discharge Instructions    Diet - low sodium heart healthy   Complete by:  As directed    Increase activity slowly   Complete by:  As directed      Allergies as of 02/12/2018      Reactions   Shellfish Allergy Anaphylaxis   HER THROAT SWELLS UP AND CLOSES   Benadryl [diphenhydramine Hcl] Other (See Comments)   Restless legs   Penicillins Rash   DID THE REACTION INVOLVE: Swelling of the face/tongue/throat, SOB, or low BP? No Sudden or severe rash/hives, skin peeling, or the inside of the mouth or nose? No Did it require medical treatment? No When did it last happen?Childhood allergy If all above answers are "NO", may proceed with cephalosporin use.   Tape Itching   Red itch with some tapes      Medication List    STOP taking these medications   bisoprolol 10 MG tablet Commonly known as:  ZEBETA   metFORMIN 500 MG 24 hr tablet Commonly known as:  GLUCOPHAGE-XR     TAKE these medications   cholecalciferol 1000 units tablet Commonly known as:  VITAMIN D Take 1,000 Units by mouth daily.   gabapentin 300 MG capsule Commonly known as:  NEURONTIN Take 2 capsules (600 mg total) by mouth 2 (two) times daily.   hydrochlorothiazide 12.5 MG capsule Commonly known as:  MICROZIDE Take 12.5 mg by mouth daily.   HYDROcodone-acetaminophen 5-325 MG tablet Commonly known as:  NORCO/VICODIN take 1 tablet by mouth every 6 hours if needed for MODERATE PAIN   lidocaine-prilocaine cream Commonly known as:  EMLA Apply to Porta-cath  1-2 hours prior to access as directed.   ondansetron 8 MG tablet Commonly known as:   ZOFRAN Take 1 tablet (8 mg total) by mouth every 8 (eight) hours as needed for nausea.   pantoprazole 40 MG tablet Commonly known as:  PROTONIX Take 40 mg by mouth daily.   propranolol 20 MG tablet Commonly known as:  INDERAL Take 1 tablet (20 mg total) by mouth 2 (two) times daily.   rotigotine 4 MG/24HR Commonly known as:  NEUPRO Place 1 patch onto the skin daily.   TRESIBA FLEXTOUCH 100 UNIT/ML Sopn FlexTouch Pen Generic drug:  insulin degludec Inject 18 Units into the skin daily.      Follow-up Information    Helane Rima, MD Follow up in 1 week(s).   Specialty:  Family Medicine Contact information: [address] [place] [address] [phone]        Ronnette Juniper, MD Follow up in 6 week(s).   Specialty:  Gastroenterology Contact information: [address] [place] [address] [phone]          Allergies  Allergen Reactions  . Shellfish Allergy Anaphylaxis    HER THROAT SWELLS UP AND CLOSES  . Benadryl [Diphenhydramine Hcl] Other (See Comments)    Restless legs   . Penicillins Rash    DID THE REACTION INVOLVE: Swelling of the face/tongue/throat, SOB, or low BP? No Sudden or severe rash/hives, skin peeling, or the inside of the mouth or nose? No Did it require medical treatment? No When did it last happen?Childhood allergy If all above answers are "NO", may proceed with cephalosporin use.   . Tape Itching    Red itch with some tapes    Consultations: GI  Procedures/Studies: No results found.  Subjective: Still mild chest pain after eating.  No blood in the stool.   Discharge Exam: Vitals:   02/12/18 0548 02/12/18 0849  BP: (!) 144/66 (!) 143/64  Pulse: 62 64  Resp: 18   Temp: 98.2 F (36.8 C)   SpO2: 98%    Vitals:   02/11/18 1338 02/11/18 2053 02/12/18 0548 02/12/18 0849  BP: (!) 157/68 (!) 153/71 (!) 144/66 (!) 143/64  Pulse: 67 68 62 64  Resp: 18 18 18    Temp: 98.6 F (37 C) 98 F  (36.7 C) 98.2 F (36.8 C)   TempSrc: Oral Oral Oral   SpO2: 97% 93% 98%   Weight:      Height:        General: Pt is alert, awake, not in acute distress Cardiovascular: RRR, S1/S2 +, no rubs, no gallops Respiratory: CTA bilaterally, no wheezing, no rhonchi Abdominal: Soft, NT, ND, bowel sounds + Extremities: no edema, no cyanosis    The results of significant diagnostics from this hospitalization (including imaging, microbiology, ancillary and laboratory) are listed below for reference.     Microbiology: Recent Results (from the past 240 hour(s))  MRSA PCR Screening     Status: None   Collection Time: 02/09/18  7:01 PM  Result Value Ref Range Status   MRSA by PCR NEGATIVE NEGATIVE Final    Comment:        The GeneXpert MRSA Assay (FDA approved for NASAL specimens only), is one  component of a comprehensive MRSA colonization surveillance program. It is not intended to diagnose MRSA infection nor to guide or monitor treatment for MRSA infections. Performed at [facility], [place] [address], [place], [place] [address]      Labs: BNP (last 3 results) No results for input(s): BNP in the last 8760 hours. Basic Metabolic Panel: Recent Labs  Lab 02/09/18 2020 02/10/18 0133 02/10/18 0430 02/10/18 1525 02/12/18 0421  NA 141 143 145 142 144  K 4.9 4.3 4.4 4.0 4.0  CL 111 112* 114* 112* 111  CO2 20* 23 23 21* 24  GLUCOSE 166* 98 103* 115* 118*  BUN 49* 48* 45* 36* 18  CREATININE 0.97 0.92 1.00 1.00 0.81  CALCIUM 8.7* 8.2* 8.2* 8.0* 8.1*  MG 1.0*  --   --   --   --   PHOS 4.0  3.9 4.9*  --  4.7*  --    Liver Function Tests: Recent Labs  Lab 02/09/18 1043 02/09/18 2020 02/10/18 0133 02/10/18 0430 02/10/18 1525  AST 36  --   --  29  --   ALT 32  --   --  26  --   ALKPHOS 69  --   --  45  --   BILITOT 1.5*  --   --  0.7  --   PROT 6.7  --   --  5.4*  --   ALBUMIN 3.6 3.1* 2.8* 2.8* 3.1*   No results for input(s): LIPASE, AMYLASE in the  last 168 hours. No results for input(s): AMMONIA in the last 168 hours. CBC: Recent Labs  Lab 02/09/18 1043  02/10/18 0430 02/10/18 1525 02/10/18 2014 02/11/18 0400 02/12/18 0421  WBC 10.7*  --  5.3  --   --  7.1 5.2  HGB 9.5*   < > 6.9* 9.7* 9.4* 9.8* 9.0*  HCT 31.6*   < > 22.8* 31.1* 30.0* 30.9* 29.5*  MCV 99.4  --  99.1  --   --  93.9 96.7  PLT 138*  --  88*  --   --  99* 82*   < > = values in this interval not displayed.   Cardiac Enzymes: Recent Labs  Lab 02/09/18 2020 02/09/18 2212  TROPONINI <0.03 <0.03   BNP: Invalid input(s): POCBNP CBG: Recent Labs  Lab 02/11/18 1950 02/11/18 2341 02/12/18 0401 02/12/18 0743 02/12/18 1153  GLUCAP 160* 89 116* 128* 118*   D-Dimer No results for input(s): DDIMER in the last 72 hours. Hgb A1c No results for input(s): HGBA1C in the last 72 hours. Lipid Profile No results for input(s): CHOL, HDL, LDLCALC, TRIG, CHOLHDL, LDLDIRECT in the last 72 hours. Thyroid function studies No results for input(s): TSH, T4TOTAL, T3FREE, THYROIDAB in the last 72 hours.  Invalid input(s): FREET3 Anemia work up No results for input(s): VITAMINB12, FOLATE, FERRITIN, TIBC, IRON, RETICCTPCT in the last 72 hours. Urinalysis    Component Value Date/Time   COLORURINE YELLOW 10/22/2017 1041   APPEARANCEUR CLEAR 10/22/2017 1041   LABSPEC 1.015 10/22/2017 1041   PHURINE 5.0 10/22/2017 1041   GLUCOSEU NEGATIVE 10/22/2017 1041   HGBUR MODERATE (A) 10/22/2017 1041   BILIRUBINUR NEGATIVE 10/22/2017 1041   KETONESUR NEGATIVE 10/22/2017 1041   PROTEINUR NEGATIVE 01/16/2018 1135   UROBILINOGEN 0.2 03/22/2014 0917   NITRITE POSITIVE (A) 10/22/2017 1041   LEUKOCYTESUR SMALL (A) 10/22/2017 1041   Sepsis Labs Invalid input(s): PROCALCITONIN,  WBC,  LACTICIDVEN Microbiology Recent Results (from the past 240 hour(s))  MRSA PCR  Screening     Status: None   Collection Time: 02/09/18  7:01 PM  Result Value Ref Range Status   MRSA by PCR NEGATIVE  NEGATIVE Final    Comment:        The GeneXpert MRSA Assay (FDA approved for NASAL specimens only), is one component of a comprehensive MRSA colonization surveillance program. It is not intended to diagnose MRSA infection nor to guide or monitor treatment for MRSA infections. Performed at [facility], [place] [address], [place], [place] [address]      Time coordinating discharge: 35 minutes.   SIGNED:   Elmarie Shiley, MD  [facility] 02/12/2018, 5:15 PM Pager   If 7PM-7AM, please contact night-coverage [email or web address] Password [email or web address]

## 2018-02-13 ENCOUNTER — Inpatient Hospital Stay: Payer: Medicare Other | Attending: Hematology and Oncology

## 2018-02-13 ENCOUNTER — Inpatient Hospital Stay: Payer: Medicare Other

## 2018-02-13 VITALS — BP 124/50 | HR 62 | Temp 98.1°F | Resp 16

## 2018-02-13 DIAGNOSIS — C482 Malignant neoplasm of peritoneum, unspecified: Secondary | ICD-10-CM | POA: Insufficient documentation

## 2018-02-13 DIAGNOSIS — D696 Thrombocytopenia, unspecified: Secondary | ICD-10-CM | POA: Diagnosis not present

## 2018-02-13 DIAGNOSIS — D509 Iron deficiency anemia, unspecified: Secondary | ICD-10-CM | POA: Insufficient documentation

## 2018-02-13 DIAGNOSIS — Z79899 Other long term (current) drug therapy: Secondary | ICD-10-CM | POA: Diagnosis not present

## 2018-02-13 DIAGNOSIS — K7581 Nonalcoholic steatohepatitis (NASH): Secondary | ICD-10-CM | POA: Diagnosis not present

## 2018-02-13 DIAGNOSIS — C569 Malignant neoplasm of unspecified ovary: Secondary | ICD-10-CM

## 2018-02-13 DIAGNOSIS — R978 Other abnormal tumor markers: Secondary | ICD-10-CM | POA: Insufficient documentation

## 2018-02-13 DIAGNOSIS — K7460 Unspecified cirrhosis of liver: Secondary | ICD-10-CM | POA: Insufficient documentation

## 2018-02-13 LAB — CBC WITH DIFFERENTIAL/PLATELET
Abs Immature Granulocytes: 0.04 10*3/uL (ref 0.00–0.07)
Basophils Absolute: 0 10*3/uL (ref 0.0–0.1)
Basophils Relative: 0 %
Eosinophils Absolute: 0.2 10*3/uL (ref 0.0–0.5)
Eosinophils Relative: 3 %
HCT: 31 % — ABNORMAL LOW (ref 36.0–46.0)
Hemoglobin: 9.9 g/dL — ABNORMAL LOW (ref 12.0–15.0)
Immature Granulocytes: 1 %
Lymphocytes Relative: 26 %
Lymphs Abs: 1.6 10*3/uL (ref 0.7–4.0)
MCH: 29.6 pg (ref 26.0–34.0)
MCHC: 31.9 g/dL (ref 30.0–36.0)
MCV: 92.8 fL (ref 80.0–100.0)
Monocytes Absolute: 0.4 10*3/uL (ref 0.1–1.0)
Monocytes Relative: 7 %
Neutro Abs: 3.9 10*3/uL (ref 1.7–7.7)
Neutrophils Relative %: 63 %
Platelets: 87 10*3/uL — ABNORMAL LOW (ref 150–400)
RBC: 3.34 MIL/uL — ABNORMAL LOW (ref 3.87–5.11)
RDW: 17.5 % — ABNORMAL HIGH (ref 11.5–15.5)
WBC: 6.1 10*3/uL (ref 4.0–10.5)
nRBC: 0.3 % — ABNORMAL HIGH (ref 0.0–0.2)

## 2018-02-13 LAB — COMPREHENSIVE METABOLIC PANEL
ALT: 30 U/L (ref 0–44)
AST: 36 U/L (ref 15–41)
Albumin: 3.4 g/dL — ABNORMAL LOW (ref 3.5–5.0)
Alkaline Phosphatase: 68 U/L (ref 38–126)
Anion gap: 10 (ref 5–15)
BUN: 14 mg/dL (ref 8–23)
CO2: 23 mmol/L (ref 22–32)
Calcium: 8.3 mg/dL — ABNORMAL LOW (ref 8.9–10.3)
Chloride: 107 mmol/L (ref 98–111)
Creatinine, Ser: 0.86 mg/dL (ref 0.44–1.00)
GFR calc Af Amer: >60 mL/min (ref >60)
GFR calc non Af Amer: >60 mL/min (ref >60)
Glucose, Bld: 125 mg/dL — ABNORMAL HIGH (ref 70–99)
Potassium: 4.1 mmol/L (ref 3.5–5.1)
Sodium: 140 mmol/L (ref 135–145)
Total Bilirubin: 1.2 mg/dL (ref 0.3–1.2)
Total Protein: 6.6 g/dL (ref 6.5–8.1)

## 2018-02-13 LAB — TOTAL PROTEIN, URINE DIPSTICK: Protein, ur: NEGATIVE mg/dL

## 2018-02-13 MED ORDER — HEPARIN SOD (PORK) LOCK FLUSH 100 UNIT/ML IV SOLN
500.0000 [IU] | Freq: Once | INTRAVENOUS | Status: AC | PRN
  Administered 2018-02-13: 500 [IU]
  Filled 2018-02-13: qty 5

## 2018-02-13 MED ORDER — SODIUM CHLORIDE 0.9% FLUSH
10.0000 mL | INTRAVENOUS | Status: DC | PRN
  Administered 2018-02-13: 10 mL
  Filled 2018-02-13: qty 10

## 2018-02-13 MED ORDER — SODIUM CHLORIDE 0.9% FLUSH
10.0000 mL | Freq: Once | INTRAVENOUS | Status: AC
  Administered 2018-02-13: 10 mL
  Filled 2018-02-13: qty 10

## 2018-02-13 MED ORDER — SODIUM CHLORIDE 0.9 % IV SOLN
510.0000 mg | Freq: Once | INTRAVENOUS | Status: AC
  Administered 2018-02-13: 510 mg via INTRAVENOUS
  Filled 2018-02-13: qty 17

## 2018-02-13 MED ORDER — SODIUM CHLORIDE 0.9 % IV SOLN
Freq: Once | INTRAVENOUS | Status: AC
  Administered 2018-02-13: 10:00:00 via INTRAVENOUS
  Filled 2018-02-13: qty 250

## 2018-02-13 MED ORDER — SODIUM CHLORIDE 0.9 % IV SOLN: 10.2000 mg/kg | Freq: Once | INTRAVENOUS | Status: DC

## 2018-02-13 NOTE — Progress Notes (Signed)
Per Dr. Alvy Bimler, give feraheme today instead of avastin.

## 2018-02-13 NOTE — Patient Instructions (Signed)

## 2018-02-14 LAB — CA 125: Cancer Antigen (CA) 125: 82.7 U/mL — ABNORMAL HIGH (ref 0.0–38.1)

## 2018-02-25 ENCOUNTER — Other Ambulatory Visit: Payer: Self-pay | Admitting: Gastroenterology

## 2018-02-25 ENCOUNTER — Encounter: Payer: Self-pay | Admitting: Hematology and Oncology

## 2018-02-26 ENCOUNTER — Inpatient Hospital Stay: Payer: Medicare Other

## 2018-02-26 ENCOUNTER — Encounter: Payer: Self-pay | Admitting: Hematology and Oncology

## 2018-02-26 ENCOUNTER — Inpatient Hospital Stay (HOSPITAL_BASED_OUTPATIENT_CLINIC_OR_DEPARTMENT_OTHER): Payer: Medicare Other | Admitting: Hematology and Oncology

## 2018-02-26 ENCOUNTER — Telehealth: Payer: Self-pay | Admitting: Hematology and Oncology

## 2018-02-26 VITALS — BP 136/74 | HR 56 | Resp 14

## 2018-02-26 VITALS — BP 150/55 | HR 60 | Temp 98.0°F | Resp 18 | Ht 62.0 in | Wt 182.0 lb

## 2018-02-26 DIAGNOSIS — C482 Malignant neoplasm of peritoneum, unspecified: Secondary | ICD-10-CM | POA: Diagnosis not present

## 2018-02-26 DIAGNOSIS — R978 Other abnormal tumor markers: Secondary | ICD-10-CM

## 2018-02-26 DIAGNOSIS — C569 Malignant neoplasm of unspecified ovary: Secondary | ICD-10-CM

## 2018-02-26 DIAGNOSIS — D509 Iron deficiency anemia, unspecified: Secondary | ICD-10-CM

## 2018-02-26 DIAGNOSIS — K7460 Unspecified cirrhosis of liver: Secondary | ICD-10-CM

## 2018-02-26 DIAGNOSIS — K7581 Nonalcoholic steatohepatitis (NASH): Secondary | ICD-10-CM

## 2018-02-26 DIAGNOSIS — D696 Thrombocytopenia, unspecified: Secondary | ICD-10-CM

## 2018-02-26 DIAGNOSIS — Z79899 Other long term (current) drug therapy: Secondary | ICD-10-CM

## 2018-02-26 DIAGNOSIS — Z7189 Other specified counseling: Secondary | ICD-10-CM

## 2018-02-26 LAB — COMPREHENSIVE METABOLIC PANEL
ALT: 40 U/L (ref 0–44)
AST: 37 U/L (ref 15–41)
Albumin: 3.6 g/dL (ref 3.5–5.0)
Alkaline Phosphatase: 109 U/L (ref 38–126)
Anion gap: 10 (ref 5–15)
BUN: 12 mg/dL (ref 8–23)
CO2: 25 mmol/L (ref 22–32)
Calcium: 8.5 mg/dL — ABNORMAL LOW (ref 8.9–10.3)
Chloride: 108 mmol/L (ref 98–111)
Creatinine, Ser: 0.82 mg/dL (ref 0.44–1.00)
GFR calc Af Amer: >60 mL/min (ref >60)
GFR calc non Af Amer: >60 mL/min (ref >60)
Glucose, Bld: 125 mg/dL — ABNORMAL HIGH (ref 70–99)
Potassium: 4.4 mmol/L (ref 3.5–5.1)
Sodium: 143 mmol/L (ref 135–145)
Total Bilirubin: 0.9 mg/dL (ref 0.3–1.2)
Total Protein: 7.1 g/dL (ref 6.5–8.1)

## 2018-02-26 LAB — IRON AND TIBC
Iron: 75 ug/dL (ref 41–142)
Saturation Ratios: 22 % (ref 21–57)
TIBC: 333 ug/dL (ref 236–444)
UIBC: 258 ug/dL (ref 120–384)

## 2018-02-26 LAB — CBC WITH DIFFERENTIAL/PLATELET
Abs Immature Granulocytes: 0 10*3/uL (ref 0.00–0.07)
Basophils Absolute: 0 10*3/uL (ref 0.0–0.1)
Basophils Relative: 0 %
Eosinophils Absolute: 0.1 10*3/uL (ref 0.0–0.5)
Eosinophils Relative: 3 %
HCT: 35.6 % — ABNORMAL LOW (ref 36.0–46.0)
Hemoglobin: 11 g/dL — ABNORMAL LOW (ref 12.0–15.0)
Immature Granulocytes: 0 %
Lymphocytes Relative: 32 %
Lymphs Abs: 1 10*3/uL (ref 0.7–4.0)
MCH: 30.1 pg (ref 26.0–34.0)
MCHC: 30.9 g/dL (ref 30.0–36.0)
MCV: 97.5 fL (ref 80.0–100.0)
Monocytes Absolute: 0.2 10*3/uL (ref 0.1–1.0)
Monocytes Relative: 6 %
Neutro Abs: 1.9 10*3/uL (ref 1.7–7.7)
Neutrophils Relative %: 59 %
Platelets: 74 10*3/uL — ABNORMAL LOW (ref 150–400)
RBC: 3.65 MIL/uL — ABNORMAL LOW (ref 3.87–5.11)
RDW: 19.2 % — ABNORMAL HIGH (ref 11.5–15.5)
WBC: 3.2 10*3/uL — ABNORMAL LOW (ref 4.0–10.5)
nRBC: 0 % (ref 0.0–0.2)

## 2018-02-26 LAB — FERRITIN: Ferritin: 148 ng/mL (ref 11–307)

## 2018-02-26 MED ORDER — SODIUM CHLORIDE 0.9 % IV SOLN
510.0000 mg | Freq: Once | INTRAVENOUS | Status: AC
  Administered 2018-02-26: 510 mg via INTRAVENOUS
  Filled 2018-02-26: qty 17

## 2018-02-26 MED ORDER — HEPARIN SOD (PORK) LOCK FLUSH 100 UNIT/ML IV SOLN
500.0000 [IU] | Freq: Once | INTRAVENOUS | Status: AC
  Administered 2018-02-26: 500 [IU]
  Filled 2018-02-26: qty 5

## 2018-02-26 MED ORDER — SODIUM CHLORIDE 0.9% FLUSH
10.0000 mL | Freq: Once | INTRAVENOUS | Status: AC
  Administered 2018-02-26: 10 mL
  Filled 2018-02-26: qty 10

## 2018-02-26 MED ORDER — SODIUM CHLORIDE 0.9 % IV SOLN
750.0000 mL | Freq: Once | INTRAVENOUS | Status: AC
  Administered 2018-02-26: 250 mL via INTRAVENOUS
  Filled 2018-02-26: qty 750

## 2018-02-26 NOTE — Patient Instructions (Signed)

## 2018-02-26 NOTE — Telephone Encounter (Signed)
Gave avs and calendar Md change dates to feb

## 2018-02-26 NOTE — Progress Notes (Signed)
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 She is not symptomatic.  Recommend close observation only  Iron deficiency anemia She has recurrent iron deficiency anemia due to GI bleed She had endoscopic procedure recently with repeat procedure scheduled for next month I recommend another dose of intravenous iron today  Liver cirrhosis secondary to NASH [facility]) She has liver cirrhosis and splenomegaly secondary to fatty liver disease I spent a lot of time educating the patient and family members the importance of diet control and weight loss strategies to preserve her liver function For now, she has appointment to follow-up with GI service and is currently placed on beta-blocker  Goals of care, counseling/discussion She is concerned about stopping treatment for fear of cancer recurrence I informed her that repeat imaging study over the past year show no evidence of disease recurrence/progression On the other hand, she had recent major life-threatening bleed.  The risk of bleeding while on bevacizumab outweighs the benefit    Orders Placed This Encounter  Procedures  . CT ABDOMEN PELVIS W CONTRAST    Standing Status:   Future    Standing Expiration Date:   02/27/2019    Order Specific Question:   If indicated for the ordered procedure, I authorize the administration of contrast media per Radiology protocol    Answer:    Yes    Order Specific Question:   Preferred imaging location?    Answer:   [facility]    Order Specific Question:   Radiology Contrast Protocol - do NOT remove file path    Answer:   [email or web address]    INTERVAL HISTORY: Please see below for problem oriented charting. She returns with her husband for further follow-up She has no further upper GI bleed since recent endoscopic procedure The patient denies any recent signs or symptoms of bleeding such as spontaneous epistaxis, hematuria or hematochezia. 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  No worrisome liver mass.  There are associated portal venous collaterals and splenomegaly consistent with portal venous hypertension. 2.  No other significant upper abdominal findings    11/08/2013 Imaging    US showed ascites    11/08/2013 Initial Diagnosis    Patient had ~ 2 months of some urinary incontinence, saw Dr Ronita Hipps in 10-2013, with pelvic mass on exam     11/12/2013 Imaging    There are findings of progressive hepatic cirrhosis with a large amount of ascites and mild splenomegaly. There are venous collaterals present. 2. There is abnormal thickening of the peritoneal surface along the left mid and lower abdominal wall. There is abnormal soft tissue density in the pelvis as well which could reflect the clinically suspected  ovarian malignancy but the findings are nondiagnostic. 3. There is a new small left pleural effusion. 4. There is no acute bowel abnormality.    11/18/2013 Imaging    Successful ultrasound guided diagnostic and therapeutic paracentesis yielding 4.1 liters of  ascites.     11/18/2013 Pathology Results    PERITONEAL/ASCITIC FLUID (SPECIMEN 1 OF 1 COLLECTED 11/18/13): SEROUS CARCINOMA, PLEASE SEE COMMENT.    12/01/2013 Tumor Marker    Patient's tumor was tested for the following markers: CA125 Results of the tumor marker test revealed 1938    12/03/2013 Imaging    Successful ultrasound-guided therapeutic paracentesis yielding 3.6 liters of peritoneal fluid.    12/07/2013 - 05/30/2014 Chemotherapy    She received 3 cycles of carboplatin and Taxol, interrupted cycle 4 for surgery.  She subsequently completed 3 more cycles of chemotherapy after surgery    12/20/2013 Imaging    Successful ultrasound-guided diagnostic and therapeutic paracentesis yielding 2.2 liters of peritoneal fluid    12/20/2013 Pathology Results    PERITONEAL/ASCITIC FLUID(SPECIMEN 1 OF 1 COLLECTED 12/20/13): MALIGNANT CELLS CONSISTENT WITH SEROUS CARCINOMA    01/13/2014 Tumor Marker    Patient's tumor was tested for the following markers: CA125 Results of the tumor marker test revealed 227    02/07/2014 Tumor Marker    Patient's tumor was tested for the following markers: CA125 Results of the tumor marker test revealed 130    02/15/2014 Genetic Testing    Genetics testing from 02-2014 normal ([employer] panel)    02/23/2014 Imaging    Interval decrease in ascites. 2. Morphologic changes in the liver consistent with cirrhosis. 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 Portal vein remains patent at this time. 3. Persistent but decreased abnormal soft tissue attenuation tracking in the omental fat. This may be secondary to interval improvement in metastatic disease. 4. Peritoneal thickening seen in the left abdomen and pelvis on the previous study has decreased and nearly resolved in the interval. This also suggests interval improvement in metastatic disease.     03/07/2014 Procedure    Ultrasound and fluoroscopically guided right internal jugular  single lumen power port catheter insertion. Tip in the SVC/RA junction. Catheter ready for use.    03/17/2014 Tumor Marker    Patient's tumor was tested for the following markers: CA125 Results of the tumor marker test revealed 45    03/29/2014 Pathology Results    1. Omentum, resection for tumor - HIGH GRADE SEROUS CARCINOMA, SEE COMMENT. 2. Ovary and fallopian tube, right - HIGH GRADE SEROUS CARCINOMA, SEE COMMENT. 3. Ovary and fallopian tube, left - HIGH GRADE SEROUS CARCINOMA, SEE COMMENT. Diagnosis Note 1. Nests and clusters of malignant cells are invading the omental tissue (part #1) with associated fibrosis. The cells are pleomorphic with prominent nucleoli. There are scattered psammoma bodies. The ovaries are atrophic and exhibit multiple foci of surface based invasive carcinoma and associated fibrosis. The fallopian tubes have a few foci of carcinoma, also superficially located. There are no precursor lesions noted in the ovaries or fallopian tubes (the entire tubes and ovaries were submitted for evaluation). While there is some retraction artifact, there are several foci suspicious for lymphovascular invasion. 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 Given the fibrosis, there does appear to be a small amount of treatment effect, however, there is abundant residual tumor.     03/29/2014 Surgery  Procedure(s) Performed: 1. Exploratory laparotomy with bilateral salpingo-oophorectomy, omentectomy radical tumor debulking for ovarian cancer .  Surgeon: Thereasa Solo, MD.  Assistant Surgeon: Lahoma Crocker, M.D. Assistant: (an MD assistant was necessary for tissue manipulation, retraction and positioning due to the complexity of the case and hospital policies).  Operative Findings: 10cm omental cake from hepatic to splenic flexure, densely adherent to  transverse colon. Milliary studding of tumor implants (<31m, too numerous in number to count) adherent to the mesentery of the small bowel and small bowel wall. Small volume (100cc) ascites. Small ovaries bilaterally, densely adherent to the pelvic cul de sac. Left ovary and tube densely adherent to sigmoid colon. Cirrhotic liver with hepatomegaly and splenomegaly.     This represented an optimal cytoreduction (R1) with visible disease residual on the bowel wall and mesentery (millial, <360mimplants).      04/18/2014 Tumor Marker    Patient's tumor was tested for the following markers: CA125 Results of the tumor marker test revealed 122    05/16/2014 Tumor Marker    Patient's tumor was tested for the following markers: CA125 Results of the tumor marker test revealed 30    06/10/2014 Tumor Marker    Patient's tumor was tested for the following markers: CA125 Results of the tumor marker test revealed 19    07/04/2014 Imaging    Interval improvement in the appearance of peritoneal metastasis secondary to ovarian cancer. 2. 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     08/18/2014 Tumor Marker    Patient's tumor was tested for the following markers: CA125 Results of the tumor marker test revealed 16    10/13/2014 Tumor Marker    Patient's tumor was tested for the following markers: CA125 Results of the tumor marker test revealed 14    11/21/2014 Tumor Marker    Patient's tumor was tested for the following markers: CA125 Results of the tumor marker test revealed 16    12/28/2014 Tumor Marker    Patient's tumor was tested for the following markers: CA125 Results of the tumor marker test revealed 762    02/27/2015 Tumor Marker    Patient's tumor was tested for the following markers: CA125 Results of the tumor marker test revealed 20.2    03/22/2015 Imaging    Filler appearance to the prior exam, with very subtle fluid tracking along portions of the liver, and very minimal  nodularity along the right paracolic gutter representing residua from the prior peritoneal cancer. The current abnormalities could simply be post therapy findings rather than necessarily representing residual malignancy. No new or enlarging lesions are identified. 2. Hepatic cirrhosis and splenomegaly. There is some gastric varices suggesting portal venous hypertension. 3. Left foraminal stenosis at L4-5 due to spurring. There is likely also some central narrowing of the thecal sac at this level. 4. Bibasilar scarring. 5. Chronic mild left mid kidney scarring    03/27/2015 Tumor Marker    Patient's tumor was tested for the following markers: CA125 Results of the tumor marker test revealed 25.3    04/24/2015 Imaging    Disc bulge L2-3 with mild spinal stenosis and right foraminal encroachment. 2. Disc bulge L3-4 with mild spinal stenosis. 3. Multifactorial spinal, left lateral recess and foraminal stenosis L4-5. There is grade 1 anterolisthesis without evident dynamic instability.    05/29/2015 Tumor Marker    Patient's tumor was tested for the following markers: CA125 Results of the tumor marker test revealed 29.9    08/21/2015 Tumor Marker  Patient's tumor was tested for the following markers: CA125 Results of the tumor marker test revealed 45    09/18/2015 Tumor Marker    Patient's tumor was tested for the following markers: CA125 Results of the tumor marker test revealed 47    09/27/2015 Imaging    New small bowel mesenteric nodule and enlarged left external iliac lymph node, highly worrisome for metastatic disease. 2. Cirrhosis and splenomegaly    10/10/2015 Tumor Marker    Patient's tumor was tested for the following markers: CA125 Results of the tumor marker test revealed 53.4    10/10/2015 - 12/11/2015 Chemotherapy    She received 4 cycles of carboplatin only    11/20/2015 Tumor Marker    Patient's tumor was tested for the following markers: CA125 Results of the tumor marker test  revealed 41.3    12/20/2015 Imaging    CT: Mild left external iliac lymphadenopathy is slightly decreased. Mildly enlarged lower mesenteric nodule is slightly decreased. 2. No new or progressive metastatic disease in the abdomen or pelvis. 3. Cirrhosis. Stable subcentimeter hypodense left liver lobe lesion. No new liver lesions. 4. Stable mild splenomegaly.  No ascites. 5. Mild sigmoid diverticulosis.    01/01/2016 -  Anti-estrogen oral therapy    She was placed on tamoxifen. Had letrozole initially but switched to tamoxifen due to poor tolerance    01/15/2016 Tumor Marker    Patient's tumor was tested for the following markers: CA125 Results of the tumor marker test revealed 31.4    02/19/2016 Tumor Marker    Patient's tumor was tested for the following markers: CA125 Results of the tumor marker test revealed 36.5    05/13/2016 Imaging    No evidence of disease progression within the abdomen or pelvis. Previously noted small central mesenteric nodule and left external iliac node appear slightly smaller. 2. Stable changes of hepatic cirrhosis and portal hypertension with associated splenomegaly. No new or enlarging hepatic lesions are identified. 3. No acute findings.    05/13/2016 Tumor Marker    Patient's tumor was tested for the following markers: CA125 Results of the tumor marker test revealed 41.2    06/24/2016 Tumor Marker    Patient's tumor was tested for the following markers: CA125 Results of the tumor marker test revealed 47.3    08/20/2016 Imaging    1. The left external iliac lymph node is slightly increased in size compared to the prior exam, currently 1.1 cm and previously 0.9 cm in diameter. 2. Stable central mesenteric lymph node at 0.9 cm in diameter. 3. Stable trace free pelvic fluid and stable slight thickening along the right paracolic gutter, without well-defined peritoneal nodularity. 4. Other imaging findings of potential clinical significance: Subsegmental atelectasis or  scarring in the lung bases. Hepatic cirrhosis. Left renal scarring. Mild splenomegaly. Sigmoid colon diverticulosis. Impingement at L4-5 due to spondylosis and degenerative disc disease broad Schmorl' s nodes at L3-L4. Pelvic floor laxity    08/23/2016 Tumor Marker    Patient's tumor was tested for the following markers: CA125 Results of the tumor marker test revealed 80.2    09/04/2016 Imaging    LV EF: 60% -   65%    09/12/2016 Tumor Marker    Patient's tumor was tested for the following markers: CA125 Results of the tumor marker test revealed 98.5    09/12/2016 - 01/29/2018 Chemotherapy    The patient received Doxil and Avastin. Avastin was discontinued in Dec 2019 due to GI hemorrhage    09/26/2016 Tumor Marker  Patient's tumor was tested for the following markers: CA125 Results of the tumor marker test revealed 68.9    10/10/2016 Tumor Marker    Patient's tumor was tested for the following markers: CA125 Results of the tumor marker test revealed 65.6    11/07/2016 Tumor Marker    Patient's tumor was tested for the following markers: CA125 Results of the tumor marker test revealed 46.4    11/29/2016 Imaging    Stable mild peritoneal thickening, suspicious for peritoneal carcinomatosis. No new or progressive disease identified. No evidence of ascites.  No significant change in 10 mm left external iliac and 8 mm mesenteric lymph nodes.  Stable hepatic cirrhosis, and splenomegaly consistent with portal venous hypertension.  Colonic diverticulosis, without radiographic evidence of diverticulitis.    12/02/2016 Imaging    Normal LV size with EF 55%. Basal inferior and basal inferoseptal hypokinesis. Normal RV size and systolic function. No significant valvular abnormalities.    12/05/2016 Tumor Marker    Patient's tumor was tested for the following markers: CA125 Results of the tumor marker test revealed 49.1    01/09/2017 Tumor Marker    Patient's tumor was tested for the  following markers: CA125 Results of the tumor marker test revealed 47.2    02/06/2017 Tumor Marker    Patient's tumor was tested for the following markers: CA125 Results of the tumor marker test revealed 44.1    02/18/2017 Imaging    Stable mild peritoneal thickening. No new or progressive disease identified within the abdomen or pelvis.  Stable tiny sub-cm left external iliac and mesenteric lymph nodes.  Stable hepatic cirrhosis, and splenomegaly consistent with portal venous hypertension. No evidence of hepatic neoplasm.  Colonic diverticulosis, without radiographic evidence of diverticulitis.    03/06/2017 Tumor Marker    Patient's tumor was tested for the following markers: CA125 Results of the tumor marker test revealed 50.4    03/17/2017 Imaging    - Left ventricle: The cavity size was normal. Wall thickness was normal. Systolic function was normal. The estimated ejection fraction was in the range of 55% to 60%. Wall motion was normal; there were no regional wall motion abnormalities. Features are consistent with a pseudonormal left ventricular filling pattern, with concomitant abnormal relaxation and increased filling pressure (grade 2 diastolic dysfunction). - Mitral valve: There was mild regurgitation. - Left atrium: The atrium was mildly dilated    04/03/2017 Tumor Marker    Patient's tumor was tested for the following markers: CA125 Results of the tumor marker test revealed 47.2    05/14/2017 Imaging    CT scan of abdomen and pelvis Stable mild peritoneal thickening. No new or progressive disease within the abdomen or pelvis.  Stable hepatic cirrhosis. Stable splenomegaly, consistent with portal venous hypertension. No evidence of hepatic neoplasm.    05/15/2017 Tumor Marker    Patient's tumor was tested for the following markers: CA125 Results of the tumor marker test revealed 56.1    06/12/2017 Tumor Marker    Patient's tumor was tested for the following markers:  CA125 Results of the tumor marker test revealed 49.2    07/10/2017 Tumor Marker    Patient's tumor was tested for the following markers: CA125 Results of the tumor marker test revealed 46.3    08/07/2017 Tumor Marker    Patient's tumor was tested for the following markers: CA125 Results of the tumor marker test revealed 52.9    08/20/2017 Imaging    1. Mild omental/peritoneal haziness, unchanged. No evidence of new metastatic  disease. 2. Cirrhosis with splenomegaly.    09/04/2017 Tumor Marker    Patient's tumor was tested for the following markers: CA125 Results of the tumor marker test revealed 48.5    11/13/2017 Tumor Marker    Patient's tumor was tested for the following markers: CA125 Results of the tumor marker test revealed 55.8    12/17/2017 Imaging    1. No findings to suggest metastatic disease in the abdomen or pelvis. 2. Severe hepatic cirrhosis with evidence of portal hypertension, as demonstrated by dilated portal vein, splenomegaly and portosystemic collateral pathways, including gastric and esophageal varices.  3. Colonic diverticulosis without evidence of acute diverticulitis at this time. 4. Additional incidental findings, as above.    01/16/2018 Tumor Marker    Patient's tumor was tested for the following markers: CA125 Results of the tumor marker test revealed 63.7    02/10/2018 Procedure    EGD - Recently bleeding grade III and large (> 5 mm) esophageal varices. Completely eradicated. Banded. - Portal hypertensive gastropathy. - Normal examined duodenum. - No specimens collected    02/13/2018 Tumor Marker    Patient's tumor was tested for the following markers: CA125 Results of the tumor marker test revealed 82.7     REVIEW OF SYSTEMS:   Constitutional: Denies fevers, chills or abnormal weight loss Eyes: Denies blurriness of vision Ears, nose, mouth, throat, and face: Denies mucositis or sore throat Respiratory: Denies cough, dyspnea or  wheezes Cardiovascular: Denies palpitation, chest discomfort or lower extremity swelling Gastrointestinal:  Denies nausea, heartburn or change in bowel habits Skin: Denies abnormal skin rashes Lymphatics: Denies new lymphadenopathy or easy bruising Neurological:Denies numbness, tingling or new weaknesses Behavioral/Psych: Mood is stable, no new changes  All other systems were reviewed with the patient and are negative.  I have reviewed the past medical history, past surgical history, social history and family history with the patient and they are unchanged from previous note.  ALLERGIES:  is allergic to shellfish allergy; benadryl [diphenhydramine hcl]; penicillins; and tape.  MEDICATIONS:  Current Outpatient Medications  Medication Sig Dispense Refill  . cholecalciferol (VITAMIN D) 1000 units tablet Take 1,000 Units by mouth daily.    Marland Kitchen gabapentin (NEURONTIN) 300 MG capsule Take 2 capsules (600 mg total) by mouth 2 (two) times daily. 120 capsule 11  . hydrochlorothiazide (MICROZIDE) 12.5 MG capsule Take 12.5 mg by mouth daily.  1  . HYDROcodone-acetaminophen (NORCO/VICODIN) 5-325 MG tablet take 1 tablet by mouth every 6 hours if needed for MODERATE PAIN 60 tablet 0  . insulin degludec (TRESIBA FLEXTOUCH) 100 UNIT/ML SOPN FlexTouch Pen Inject 18 Units into the skin daily.    Marland Kitchen lidocaine-prilocaine (EMLA) cream Apply to Porta-cath  1-2 hours prior to access as directed. 30 g 9  . ondansetron (ZOFRAN) 8 MG tablet Take 1 tablet (8 mg total) by mouth every 8 (eight) hours as needed for nausea. 30 tablet 3  . pantoprazole (PROTONIX) 40 MG tablet Take 40 mg by mouth daily.   0  . propranolol (INDERAL) 20 MG tablet Take 1 tablet (20 mg total) by mouth 2 (two) times daily. 60 tablet 0  . rotigotine (NEUPRO) 4 MG/24HR Place 1 patch onto the skin daily. 42 patch 0   No current facility-administered medications for this visit.    Facility-Administered Medications Ordered in Other Visits   Medication Dose Route Frequency Provider Last Rate Last Dose  . ferumoxytol (FERAHEME) 510 mg in sodium chloride 0.9 % 100 mL IVPB  510 mg Intravenous Once Marianita Botkin,  MD      . heparin lock flush 100 unit/mL  500 Units Intracatheter Once Alvy Bimler, Burnetta Kohls, MD      . sodium chloride flush (NS) 0.9 % injection 10 mL  10 mL Intracatheter Once Alvy Bimler, Lelynd Poer, MD        PHYSICAL EXAMINATION: ECOG PERFORMANCE STATUS: 1 - Symptomatic but completely ambulatory  Vitals:   02/26/18 0838  BP: (!) 150/55  Pulse: 60  Resp: 18  Temp: 98 F (36.7 C)  SpO2: 100%   Filed Weights   02/26/18 0838  Weight: 182 lb (82.6 kg)    GENERAL:alert, no distress and comfortable SKIN: skin color, texture, turgor are normal, no rashes or significant lesions EYES: normal, Conjunctiva are pink and non-injected, sclera clear OROPHARYNX:no exudate, no erythema and lips, buccal mucosa, and tongue normal  NECK: supple, thyroid normal size, non-tender, without nodularity LYMPH:  no palpable lymphadenopathy in the cervical, axillary or inguinal LUNGS: clear to auscultation and percussion with normal breathing effort HEART: regular rate & rhythm and no murmurs and no lower extremity edema ABDOMEN:abdomen soft, non-tender and normal bowel sounds Musculoskeletal:no cyanosis of digits and no clubbing  NEURO: alert & oriented x 3 with fluent speech, no focal motor/sensory deficits  LABORATORY DATA:  I have reviewed the data as listed    Component Value Date/Time   NA 143 02/26/2018 0809   NA 142 02/06/2017 0742   K 4.4 02/26/2018 0809   K 3.9 02/06/2017 0742   CL 108 02/26/2018 0809   CO2 25 02/26/2018 0809   CO2 22 02/06/2017 0742   GLUCOSE 125 (H) 02/26/2018 0809   GLUCOSE 156 (H) 02/06/2017 0742   BUN 12 02/26/2018 0809   BUN 13.5 02/06/2017 0742   CREATININE 0.82 02/26/2018 0809   CREATININE 0.8 02/06/2017 0742   CALCIUM 8.5 (L) 02/26/2018 0809   CALCIUM 8.5 02/06/2017 0742   PROT 7.1 02/26/2018 0809   PROT  6.9 02/06/2017 0742   ALBUMIN 3.6 02/26/2018 0809   ALBUMIN 3.7 02/06/2017 0742   AST 37 02/26/2018 0809   AST 47 (H) 02/06/2017 0742   ALT 40 02/26/2018 0809   ALT 54 02/06/2017 0742   ALKPHOS 109 02/26/2018 0809   ALKPHOS 130 02/06/2017 0742   BILITOT 0.9 02/26/2018 0809   BILITOT 0.65 02/06/2017 0742   GFRNONAA >60 02/26/2018 0809   GFRAA >60 02/26/2018 0809    No results found for: SPEP, UPEP  Lab Results  Component Value Date   WBC 3.2 (L) 02/26/2018   NEUTROABS 1.9 02/26/2018   HGB 11.0 (L) 02/26/2018   HCT 35.6 (L) 02/26/2018   MCV 97.5 02/26/2018   PLT 74 (L) 02/26/2018      Chemistry      Component Value Date/Time   NA 143 02/26/2018 0809   NA 142 02/06/2017 0742   K 4.4 02/26/2018 0809   K 3.9 02/06/2017 0742   CL 108 02/26/2018 0809   CO2 25 02/26/2018 0809   CO2 22 02/06/2017 0742   BUN 12 02/26/2018 0809   BUN 13.5 02/06/2017 0742   CREATININE 0.82 02/26/2018 0809   CREATININE 0.8 02/06/2017 0742      Component Value Date/Time   CALCIUM 8.5 (L) 02/26/2018 0809   CALCIUM 8.5 02/06/2017 0742   ALKPHOS 109 02/26/2018 0809   ALKPHOS 130 02/06/2017 0742   AST 37 02/26/2018 0809   AST 47 (H) 02/06/2017 0742   ALT 40 02/26/2018 0809   ALT 54 02/06/2017 0742   BILITOT 0.9 02/26/2018  0809   BILITOT 0.65 02/06/2017 0742     I have reviewed recent endoscopy report  All questions were answered. The patient knows to call the clinic with any problems, questions or concerns. No barriers to learning was detected.  I spent 25 minutes counseling the patient face to face. The total time spent in the appointment was 30 minutes and more than 50% was on counseling and review of test results  [name], MD 02/26/2018 9:07 AM

## 2018-02-26 NOTE — Assessment & Plan Note (Signed)
Even though she has chronic elevated tumor markers, her repeat CT imaging from November show no evidence of active disease I suspect elevated tumor marker could be related to her liver disease I plan to repeat imaging study again next month Due to recent GI bleed, I will discontinue Avastin permanently

## 2018-02-26 NOTE — Assessment & Plan Note (Signed)
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 She is not symptomatic.  Recommend close observation only

## 2018-02-26 NOTE — Assessment & Plan Note (Signed)
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

## 2018-02-26 NOTE — Assessment & Plan Note (Signed)
She is concerned about stopping treatment for fear of cancer recurrence I informed her that repeat imaging study over the past year show no evidence of disease recurrence/progression On the other hand, she had recent major life-threatening bleed.  The risk of bleeding while on bevacizumab outweighs the benefit

## 2018-02-26 NOTE — Assessment & Plan Note (Signed)
She has recurrent iron deficiency anemia due to GI bleed She had endoscopic procedure recently with repeat procedure scheduled for next month I recommend another dose of intravenous iron today

## 2018-02-27 ENCOUNTER — Telehealth: Payer: Self-pay

## 2018-02-27 ENCOUNTER — Other Ambulatory Visit: Payer: Self-pay | Admitting: Hematology and Oncology

## 2018-02-27 MED ORDER — HYDROCODONE-ACETAMINOPHEN 5-325 MG PO TABS: ORAL_TABLET | ORAL | 0 refills | Status: DC

## 2018-02-27 NOTE — Telephone Encounter (Signed)
done

## 2018-02-27 NOTE — Telephone Encounter (Signed)
She called and left a message. She forgot to ask for a refill on Hydrocodone yesterday. Ask that it be sent to pharmacy.

## 2018-03-08 ENCOUNTER — Emergency Department (HOSPITAL_COMMUNITY): Payer: Medicare Other

## 2018-03-08 ENCOUNTER — Other Ambulatory Visit: Payer: Self-pay

## 2018-03-08 ENCOUNTER — Inpatient Hospital Stay (HOSPITAL_COMMUNITY)
Admission: EM | Admit: 2018-03-08 | Discharge: 2018-03-11 | DRG: 493 | Disposition: A | Discharge status: Home Health | Payer: Medicare Other | Attending: Internal Medicine | Admitting: Internal Medicine

## 2018-03-08 ENCOUNTER — Encounter (HOSPITAL_COMMUNITY): Payer: Self-pay

## 2018-03-08 DIAGNOSIS — K7581 Nonalcoholic steatohepatitis (NASH): Secondary | ICD-10-CM | POA: Diagnosis present

## 2018-03-08 DIAGNOSIS — S8261XA Displaced fracture of lateral malleolus of right fibula, initial encounter for closed fracture: Principal | ICD-10-CM | POA: Diagnosis present

## 2018-03-08 DIAGNOSIS — C569 Malignant neoplasm of unspecified ovary: Secondary | ICD-10-CM | POA: Diagnosis present

## 2018-03-08 DIAGNOSIS — W108XXA Fall (on) (from) other stairs and steps, initial encounter: Secondary | ICD-10-CM | POA: Diagnosis present

## 2018-03-08 DIAGNOSIS — M25571 Pain in right ankle and joints of right foot: Secondary | ICD-10-CM | POA: Diagnosis present

## 2018-03-08 DIAGNOSIS — J45909 Unspecified asthma, uncomplicated: Secondary | ICD-10-CM | POA: Diagnosis present

## 2018-03-08 DIAGNOSIS — G2581 Restless legs syndrome: Secondary | ICD-10-CM | POA: Diagnosis present

## 2018-03-08 DIAGNOSIS — I10 Essential (primary) hypertension: Secondary | ICD-10-CM | POA: Diagnosis present

## 2018-03-08 DIAGNOSIS — Z888 Allergy status to other drugs, medicaments and biological substances status: Secondary | ICD-10-CM

## 2018-03-08 DIAGNOSIS — I5032 Chronic diastolic (congestive) heart failure: Secondary | ICD-10-CM | POA: Diagnosis present

## 2018-03-08 DIAGNOSIS — C482 Malignant neoplasm of peritoneum, unspecified: Secondary | ICD-10-CM | POA: Diagnosis present

## 2018-03-08 DIAGNOSIS — I110 Hypertensive heart disease with heart failure: Secondary | ICD-10-CM | POA: Diagnosis present

## 2018-03-08 DIAGNOSIS — D696 Thrombocytopenia, unspecified: Secondary | ICD-10-CM | POA: Diagnosis present

## 2018-03-08 DIAGNOSIS — E669 Obesity, unspecified: Secondary | ICD-10-CM | POA: Diagnosis present

## 2018-03-08 DIAGNOSIS — Z6833 Body mass index (BMI) 33.0-33.9, adult: Secondary | ICD-10-CM | POA: Diagnosis not present

## 2018-03-08 DIAGNOSIS — C786 Secondary malignant neoplasm of retroperitoneum and peritoneum: Secondary | ICD-10-CM | POA: Diagnosis present

## 2018-03-08 DIAGNOSIS — Z9079 Acquired absence of other genital organ(s): Secondary | ICD-10-CM | POA: Diagnosis not present

## 2018-03-08 DIAGNOSIS — Z85828 Personal history of other malignant neoplasm of skin: Secondary | ICD-10-CM

## 2018-03-08 DIAGNOSIS — Z79899 Other long term (current) drug therapy: Secondary | ICD-10-CM

## 2018-03-08 DIAGNOSIS — W19XXXA Unspecified fall, initial encounter: Secondary | ICD-10-CM | POA: Diagnosis present

## 2018-03-08 DIAGNOSIS — K766 Portal hypertension: Secondary | ICD-10-CM | POA: Diagnosis present

## 2018-03-08 DIAGNOSIS — E119 Type 2 diabetes mellitus without complications: Secondary | ICD-10-CM | POA: Diagnosis present

## 2018-03-08 DIAGNOSIS — Z91013 Allergy to seafood: Secondary | ICD-10-CM

## 2018-03-08 DIAGNOSIS — Z833 Family history of diabetes mellitus: Secondary | ICD-10-CM

## 2018-03-08 DIAGNOSIS — D61818 Other pancytopenia: Secondary | ICD-10-CM | POA: Diagnosis present

## 2018-03-08 DIAGNOSIS — R188 Other ascites: Secondary | ICD-10-CM | POA: Diagnosis present

## 2018-03-08 DIAGNOSIS — K7460 Unspecified cirrhosis of liver: Secondary | ICD-10-CM | POA: Diagnosis present

## 2018-03-08 DIAGNOSIS — Z880 Allergy status to penicillin: Secondary | ICD-10-CM

## 2018-03-08 DIAGNOSIS — Y92008 Other place in unspecified non-institutional (private) residence as the place of occurrence of the external cause: Secondary | ICD-10-CM | POA: Diagnosis not present

## 2018-03-08 DIAGNOSIS — I8510 Secondary esophageal varices without bleeding: Secondary | ICD-10-CM | POA: Diagnosis present

## 2018-03-08 DIAGNOSIS — Z90711 Acquired absence of uterus with remaining cervical stump: Secondary | ICD-10-CM | POA: Diagnosis not present

## 2018-03-08 DIAGNOSIS — Z9221 Personal history of antineoplastic chemotherapy: Secondary | ICD-10-CM

## 2018-03-08 DIAGNOSIS — K922 Gastrointestinal hemorrhage, unspecified: Secondary | ICD-10-CM | POA: Diagnosis present

## 2018-03-08 DIAGNOSIS — S82831A Other fracture of upper and lower end of right fibula, initial encounter for closed fracture: Secondary | ICD-10-CM | POA: Diagnosis not present

## 2018-03-08 DIAGNOSIS — Z87891 Personal history of nicotine dependence: Secondary | ICD-10-CM

## 2018-03-08 DIAGNOSIS — H4020X0 Unspecified primary angle-closure glaucoma, stage unspecified: Secondary | ICD-10-CM | POA: Diagnosis present

## 2018-03-08 DIAGNOSIS — R350 Frequency of micturition: Secondary | ICD-10-CM | POA: Diagnosis present

## 2018-03-08 DIAGNOSIS — N3281 Overactive bladder: Secondary | ICD-10-CM | POA: Diagnosis present

## 2018-03-08 DIAGNOSIS — I252 Old myocardial infarction: Secondary | ICD-10-CM

## 2018-03-08 DIAGNOSIS — Z794 Long term (current) use of insulin: Secondary | ICD-10-CM

## 2018-03-08 DIAGNOSIS — Z90722 Acquired absence of ovaries, bilateral: Secondary | ICD-10-CM | POA: Diagnosis not present

## 2018-03-08 DIAGNOSIS — Z419 Encounter for procedure for purposes other than remedying health state, unspecified: Secondary | ICD-10-CM

## 2018-03-08 DIAGNOSIS — I2510 Atherosclerotic heart disease of native coronary artery without angina pectoris: Secondary | ICD-10-CM | POA: Diagnosis present

## 2018-03-08 DIAGNOSIS — Z825 Family history of asthma and other chronic lower respiratory diseases: Secondary | ICD-10-CM

## 2018-03-08 DIAGNOSIS — Z8679 Personal history of other diseases of the circulatory system: Secondary | ICD-10-CM

## 2018-03-08 DIAGNOSIS — K7469 Other cirrhosis of liver: Secondary | ICD-10-CM | POA: Diagnosis present

## 2018-03-08 DIAGNOSIS — Z8249 Family history of ischemic heart disease and other diseases of the circulatory system: Secondary | ICD-10-CM

## 2018-03-08 DIAGNOSIS — Z9109 Other allergy status, other than to drugs and biological substances: Secondary | ICD-10-CM

## 2018-03-08 LAB — BASIC METABOLIC PANEL
Anion gap: 8 (ref 5–15)
BUN: 22 mg/dL (ref 8–23)
CO2: 24 mmol/L (ref 22–32)
Calcium: 8.8 mg/dL — ABNORMAL LOW (ref 8.9–10.3)
Chloride: 109 mmol/L (ref 98–111)
Creatinine, Ser: 0.84 mg/dL (ref 0.44–1.00)
GFR calc Af Amer: >60 mL/min (ref >60)
GFR calc non Af Amer: >60 mL/min (ref >60)
Glucose, Bld: 113 mg/dL — ABNORMAL HIGH (ref 70–99)
Potassium: 3.7 mmol/L (ref 3.5–5.1)
Sodium: 141 mmol/L (ref 135–145)

## 2018-03-08 LAB — CBC WITH DIFFERENTIAL/PLATELET
Abs Immature Granulocytes: 0.01 10*3/uL (ref 0.00–0.07)
Basophils Absolute: 0 10*3/uL (ref 0.0–0.1)
Basophils Relative: 0 %
Eosinophils Absolute: 0.1 10*3/uL (ref 0.0–0.5)
Eosinophils Relative: 3 %
HCT: 36 % (ref 36.0–46.0)
Hemoglobin: 11.1 g/dL — ABNORMAL LOW (ref 12.0–15.0)
Immature Granulocytes: 0 %
Lymphocytes Relative: 34 %
Lymphs Abs: 1.2 10*3/uL (ref 0.7–4.0)
MCH: 31.3 pg (ref 26.0–34.0)
MCHC: 30.8 g/dL (ref 30.0–36.0)
MCV: 101.4 fL — ABNORMAL HIGH (ref 80.0–100.0)
Monocytes Absolute: 0.2 10*3/uL (ref 0.1–1.0)
Monocytes Relative: 5 %
Neutro Abs: 2 10*3/uL (ref 1.7–7.7)
Neutrophils Relative %: 58 %
Platelets: 67 10*3/uL — ABNORMAL LOW (ref 150–400)
RBC: 3.55 MIL/uL — ABNORMAL LOW (ref 3.87–5.11)
RDW: 18.8 % — ABNORMAL HIGH (ref 11.5–15.5)
WBC: 3.5 10*3/uL — ABNORMAL LOW (ref 4.0–10.5)
nRBC: 0 % (ref 0.0–0.2)

## 2018-03-08 LAB — PROTIME-INR
INR: 1.2
Prothrombin Time: 15.1 seconds (ref 11.4–15.2)

## 2018-03-08 MED ORDER — SENNOSIDES-DOCUSATE SODIUM 8.6-50 MG PO TABS: 1.0000 | ORAL_TABLET | Freq: Every evening | ORAL | Status: DC | PRN

## 2018-03-08 MED ORDER — METHOCARBAMOL 500 MG PO TABS
500.0000 mg | ORAL_TABLET | Freq: Three times a day (TID) | ORAL | Status: DC | PRN
  Administered 2018-03-09 (×2): 500 mg via ORAL
  Filled 2018-03-08 (×3): qty 1

## 2018-03-08 MED ORDER — HYDRALAZINE HCL 20 MG/ML IJ SOLN: 5.0000 mg | INTRAMUSCULAR | Status: DC | PRN

## 2018-03-08 MED ORDER — MORPHINE SULFATE (PF) 2 MG/ML IV SOLN
1.0000 mg | INTRAVENOUS | Status: DC | PRN
  Administered 2018-03-09: 1 mg via INTRAVENOUS
  Filled 2018-03-08: qty 1

## 2018-03-08 MED ORDER — ONDANSETRON HCL 4 MG/2ML IJ SOLN: 4.0000 mg | Freq: Three times a day (TID) | INTRAMUSCULAR | Status: DC | PRN

## 2018-03-08 MED ORDER — ZOLPIDEM TARTRATE 5 MG PO TABS: 5.0000 mg | ORAL_TABLET | Freq: Every evening | ORAL | Status: DC | PRN

## 2018-03-08 MED ORDER — GABAPENTIN 300 MG PO CAPS
600.0000 mg | ORAL_CAPSULE | Freq: Two times a day (BID) | ORAL | Status: DC
  Administered 2018-03-09 – 2018-03-11 (×6): 600 mg via ORAL
  Filled 2018-03-08 (×5): qty 2

## 2018-03-08 MED ORDER — ALBUTEROL SULFATE (2.5 MG/3ML) 0.083% IN NEBU: 2.5000 mg | INHALATION_SOLUTION | RESPIRATORY_TRACT | Status: DC | PRN

## 2018-03-08 MED ORDER — PROPRANOLOL HCL 20 MG PO TABS
20.0000 mg | ORAL_TABLET | Freq: Two times a day (BID) | ORAL | Status: DC
  Administered 2018-03-09 – 2018-03-11 (×6): 20 mg via ORAL
  Filled 2018-03-08 (×6): qty 1

## 2018-03-08 MED ORDER — INSULIN GLARGINE 100 UNIT/ML ~~LOC~~ SOLN
9.0000 [IU] | Freq: Every day | SUBCUTANEOUS | Status: DC
  Administered 2018-03-09 – 2018-03-11 (×3): 9 [IU] via SUBCUTANEOUS
  Filled 2018-03-08 (×4): qty 0.09

## 2018-03-08 MED ORDER — DM-GUAIFENESIN ER 30-600 MG PO TB12
1.0000 | ORAL_TABLET | Freq: Two times a day (BID) | ORAL | Status: DC | PRN
  Filled 2018-03-08: qty 1

## 2018-03-08 MED ORDER — HYDROCHLOROTHIAZIDE 12.5 MG PO CAPS
12.5000 mg | ORAL_CAPSULE | Freq: Every day | ORAL | Status: DC
  Administered 2018-03-09 – 2018-03-11 (×3): 12.5 mg via ORAL
  Filled 2018-03-08 (×3): qty 1

## 2018-03-08 MED ORDER — VITAMIN D 25 MCG (1000 UNIT) PO TABS
1000.0000 [IU] | ORAL_TABLET | Freq: Every day | ORAL | Status: DC
  Administered 2018-03-09 – 2018-03-10 (×2): 1000 [IU] via ORAL
  Filled 2018-03-08 (×3): qty 1

## 2018-03-08 MED ORDER — HYDROCODONE-ACETAMINOPHEN 5-325 MG PO TABS
1.0000 | ORAL_TABLET | ORAL | Status: DC | PRN
  Administered 2018-03-09 – 2018-03-11 (×3): 1 via ORAL
  Filled 2018-03-08 (×3): qty 1

## 2018-03-08 MED ORDER — PANTOPRAZOLE SODIUM 40 MG PO TBEC: 40.0000 mg | DELAYED_RELEASE_TABLET | Freq: Every day | ORAL | Status: DC

## 2018-03-08 MED ORDER — INSULIN ASPART 100 UNIT/ML ~~LOC~~ SOLN
0.0000 [IU] | Freq: Three times a day (TID) | SUBCUTANEOUS | Status: DC
  Administered 2018-03-10: 2 [IU] via SUBCUTANEOUS
  Administered 2018-03-10: 1 [IU] via SUBCUTANEOUS
  Administered 2018-03-10 – 2018-03-11 (×2): 2 [IU] via SUBCUTANEOUS

## 2018-03-08 MED ORDER — ROTIGOTINE 4 MG/24HR TD PT24
1.0000 | MEDICATED_PATCH | Freq: Every day | TRANSDERMAL | Status: DC
  Administered 2018-03-10 – 2018-03-11 (×2): 1 via TRANSDERMAL
  Filled 2018-03-08 (×3): qty 1

## 2018-03-08 MED ORDER — ONDANSETRON 8 MG PO TBDP
8.0000 mg | ORAL_TABLET | Freq: Once | ORAL | Status: AC
  Administered 2018-03-08: 8 mg via ORAL
  Filled 2018-03-08: qty 1

## 2018-03-08 MED ORDER — HYDROCODONE-ACETAMINOPHEN 5-325 MG PO TABS
2.0000 | ORAL_TABLET | Freq: Once | ORAL | Status: AC
  Administered 2018-03-08: 2 via ORAL
  Filled 2018-03-08: qty 2

## 2018-03-08 NOTE — ED Triage Notes (Signed)
Pt had on slippery bedroom shoes and slipped on garage stairs, injuring right ankle. Pt went to [facility] and had xray done, showing a mildly displaced fracture involving the distal fibula. CD sent with imaging, given to Xray.

## 2018-03-08 NOTE — ED Notes (Signed)
Patient transported to X-ray 

## 2018-03-08 NOTE — Progress Notes (Signed)
Patient scheduled for ORIF right ankle tomorrow at 1:30 pm pending medical evaluation and clearance.  Will see patient in the morning for full consult.  NPO after midnight.  Hold DVT chemoprophylaxis.

## 2018-03-08 NOTE — H&P (Signed)
History and Physical    Olivia Benson GGE:[id number] DOB: [birth year] DOA: 03/08/2018  Referring MD/NP/PA:   PCP: Helane Rima, MD   Patient coming from:  The patient is coming from home.  At baseline, pt is independent for most of ADL.        Chief Complaint: fall and right ankle pain  HPI: Olivia Benson is a 73 y.o. female with medical history significant of hypertension, diabetes mellitus, asthma, [name], liver cirrhosis, portal hypertension, GIB, CAD, non-STEMI, disseminated ovarian cancer with peritoneal carcinomatosis (stopped chemotherapy 4 weeks ago), RLS, thrombocytopenia, obesity, who presents with fall, right ankle pain.  Pt states that she has on slippery bedroom shoes and slipped on garage stairs at about 1:00 PM, injured her right ankle, causing severe pain in right ankle. The pain is constant, sharp, 10 out of 10 severity, nonradiating.  She does not have leg numbness.  Patient strongly denies any head or neck injury.  No neck or head pain.  Patient went to [facility] and had xray done, showing a mildly displaced fracture involving the distal fibula. She was sent to ED for further evaluation and treatment.  Patient does not have chest pain, shortness of breath, cough, fever or chills.  No nausea vomiting, diarrhea, abdominal pain, symptoms of UTI or unilateral weakness.  ED Course: pt was found to have pancytopenia (WBC 3.5, hemoglobin 11.1, platelet 67), electrolytes renal function okay, INR 1.20, temperature normal, no tachycardia, no tachypnea, oxygen saturation 96% on room air.  X-ray showed right displaced distal fibula fracture.  Patient is admitted to [facility] bed as inpatient.  Orthopedic surgeon, Dr. ICU was consulted--> will do surgery tomorrow.  Review of Systems:   General: no fevers, chills, no body weight gain, has fatigue HEENT: no blurry vision, hearing changes or sore throat Respiratory: no dyspnea, coughing, wheezing CV: no chest pain, no  palpitations GI: no nausea, vomiting, abdominal pain, diarrhea, constipation GU: no dysuria, burning on urination, increased urinary frequency, hematuria  Ext: no leg edema Neuro: no unilateral weakness, numbness, or tingling, no vision change or hearing loss.  Had fall. Skin: no rash, no skin tear. MSK: has right ankle pain. Heme: No easy bruising.  Travel history: No recent long distant travel.  Allergy:  Allergies  Allergen Reactions  . Shellfish Allergy Anaphylaxis    HER THROAT SWELLS UP AND CLOSES  . Benadryl [Diphenhydramine Hcl] Other (See Comments)    Restless legs   . Penicillins Rash    DID THE REACTION INVOLVE: Swelling of the face/tongue/throat, SOB, or low BP? No Sudden or severe rash/hives, skin peeling, or the inside of the mouth or nose? No Did it require medical treatment? No When did it last happen?Childhood allergy If all above answers are "NO", may proceed with cephalosporin use.   . Tape Itching    Red itch with some tapes    Past Medical History:  Diagnosis Date  . Asthma   . Cirrhosis ([place])   . Complication of anesthesia    slow to wake up / n & v  . Diabetes mellitus   . Elevated LFTs   . Family history of adverse reaction to anesthesia    multiple family members have had problems such as slow to wake / "flat lined" /  ventilator    . Frequency of urination   . Glaucoma, narrow-angle   . History of skin cancer   . Hypertension   . Non-ST elevated myocardial infarction (non-STEMI) [facility]) Sept 2012  Mild elevation in troponin and CK MB but no evidence of coronary disease at time of cath  . Obesity   . Ovarian cancer ([place]) 11/22/2013   Disseminated ovarian cancer  . Overactive bladder   . PONV (postoperative nausea and vomiting)   . Portal hypertension ([place])   . Restless leg syndrome   . Tingling    hands and feet    Past Surgical History:  Procedure Laterality Date  . ABDOMINAL ULTRASOUND  Sept 2012   No gallbladder disease, +  fatty liver  . CARDIAC CATHETERIZATION  10/25/2010   EF 60%; Normal coronaries  . COLONOSCOPY  10/18/2005  . COSMETIC SURGERY  2003  . DENTAL SURGERY    . ESOPHAGEAL BANDING  02/10/2018   Procedure: ESOPHAGEAL BANDING;  Surgeon: Ronnette Juniper, MD;  Location: [facility];  Service: Gastroenterology;;  . ESOPHAGOGASTRODUODENOSCOPY (EGD) WITH PROPOFOL N/A 02/10/2018   Procedure: ESOPHAGOGASTRODUODENOSCOPY (EGD) WITH PROPOFOL;  Surgeon: Ronnette Juniper, MD;  Location: [facility];  Service: Gastroenterology;  Laterality: N/A;  . EYE SURGERY Bilateral 09/19/14 R eye, 08/2014 L eye   open angles in both eyes d/t narrow angle glaucoma  . LAPAROTOMY N/A 03/29/2014   Procedure: EXPLORATORY LAPAROTOMY;  Surgeon: Everitt Amber, MD;  Location: [facility];  Service: Gynecology;  Laterality: N/A;  . PARTIAL HYSTERECTOMY  1988  . SALPINGOOPHORECTOMY Bilateral 03/29/2014   Procedure: BILATERAL SALPINGO OOPHORECTOMY, OMENTECTOMY, DEBULKING;  Surgeon: Everitt Amber, MD;  Location: [facility];  Service: Gynecology;  Laterality: Bilateral;  . TONSILLECTOMY  1971  . TUBAL LIGATION    . [facility]  11/24/2009    Social History:  reports that she quit smoking about 36 years ago. Her smoking use included cigarettes. She has a 1.00 pack-year smoking history. She has never used smokeless tobacco. She reports that she does not drink alcohol or use drugs.  Family History:  Family History  Problem Relation Age of Onset  . Asthma Mother   . Diabetes Mother   . Hypertension Mother   . Allergies Mother   . Rheum arthritis Mother   . Bladder Cancer Father 90       former heavy smoker  . Hypertension Father   . Stroke Father   . Breast cancer Sister 37  . Diabetes Sister        x 3  . Allergies Sister   . Asthma Sister        x 2   . Breast cancer Maternal Aunt 85  . Breast cancer Cousin 35       (maternal) bilateral cancer  . Aneurysm Son 29  . Diabetes Brother   . Allergies Brother   . Asthma Brother      Prior to  Admission medications   Medication Sig Start Date End Date Taking? Authorizing Provider  cholecalciferol (VITAMIN D) 1000 units tablet Take 1,000 Units by mouth at bedtime.    Yes [provider]  gabapentin (NEURONTIN) 300 MG capsule Take 2 capsules (600 mg total) by mouth 2 (two) times daily. 06/30/17  Yes [name], MD  hydrochlorothiazide (MICROZIDE) 12.5 MG capsule Take 12.5 mg by mouth daily after breakfast.  05/21/17  Yes [provider]  HYDROcodone-acetaminophen (NORCO/VICODIN) 5-325 MG tablet take 1 tablet by mouth every 6 hours if needed for MODERATE PAIN Patient taking differently: Take 1 tablet by mouth every 6 (six) hours as needed for moderate pain or severe pain.  02/27/18  Yes Gorsuch, Ni, MD  insulin degludec (TRESIBA FLEXTOUCH) 100 UNIT/ML SOPN FlexTouch Pen  Inject 18 Units into the skin daily after breakfast.    Yes [provider]  lidocaine-prilocaine (EMLA) cream Apply to Porta-cath  1-2 hours prior to access as directed. 11/13/17  Yes Gorsuch, Ni, MD  ondansetron (ZOFRAN) 8 MG tablet Take 1 tablet (8 mg total) by mouth every 8 (eight) hours as needed for nausea. 10/24/16  Yes Gorsuch, Ni, MD  pantoprazole (PROTONIX) 40 MG tablet Take 40 mg by mouth daily after breakfast.  04/26/15  Yes [provider]  propranolol (INDERAL) 20 MG tablet Take 1 tablet (20 mg total) by mouth 2 (two) times daily. 02/12/18  Yes Regalado, Belkys A, MD  rotigotine (NEUPRO) 4 MG/24HR Place 1 patch onto the skin daily. 12/31/17  Yes TatEustace Quail, DO    Physical Exam: Vitals:   03/08/18 2100 03/08/18 2309 03/09/18 0000 03/09/18 0152  BP: (!) 122/58 112/60 122/62 136/62  Pulse: 61 60 (!) 57 (!) 57  Resp:  20 20 16   Temp:    (!) 97.5 F (36.4 C)  TempSrc:    Oral  SpO2: 90% 97% 98% 96%  Weight:      Height:       General: Not in acute distress HEENT:       Eyes: PERRL, EOMI, no scleral icterus.       ENT: No discharge from the ears and nose, no pharynx  injection, no tonsillar enlargement.        Neck: No JVD, no bruit, no mass felt. Heme: No neck lymph node enlargement. Cardiac: S1/S2, RRR, No murmurs, No gallops or rubs. Respiratory: Good air movement bilaterally. No rales, wheezing, rhonchi or rubs. GI: Soft, nondistended, nontender, no rebound pain, no organomegaly, BS present. GU: No hematuria Ext: No pitting leg edema bilaterally. 2+DP/PT pulse bilaterally. Musculoskeletal: has right ankle tenderness. Skin: No rashes.  Neuro: Alert, oriented X3, cranial nerves II-XII grossly intact, moves all extremities normally.  Psych: Patient is not psychotic, no suicidal or hemocidal ideation.  Labs on Admission: I have personally reviewed following labs and imaging studies  CBC: Recent Labs  Lab 03/08/18 2141  WBC 3.5*  NEUTROABS 2.0  HGB 11.1*  HCT 36.0  MCV 101.4*  PLT 67*   Basic Metabolic Panel: Recent Labs  Lab 03/08/18 2141  NA 141  K 3.7  CL 109  CO2 24  GLUCOSE 113*  BUN 22  CREATININE 0.84  CALCIUM 8.8*   GFR: Estimated Creatinine Clearance: 62.1 mL/min (by C-G formula based on SCr of 0.84 mg/dL). Liver Function Tests: No results for input(s): AST, ALT, ALKPHOS, BILITOT, PROT, ALBUMIN in the last 168 hours. No results for input(s): LIPASE, AMYLASE in the last 168 hours. No results for input(s): AMMONIA in the last 168 hours. Coagulation Profile: Recent Labs  Lab 03/08/18 2141  INR 1.20   Cardiac Enzymes: No results for input(s): CKTOTAL, CKMB, CKMBINDEX, TROPONINI in the last 168 hours. BNP (last 3 results) No results for input(s): PROBNP in the last 8760 hours. HbA1C: No results for input(s): HGBA1C in the last 72 hours. CBG: No results for input(s): GLUCAP in the last 168 hours. Lipid Profile: No results for input(s): CHOL, HDL, LDLCALC, TRIG, CHOLHDL, LDLDIRECT in the last 72 hours. Thyroid Function Tests: No results for input(s): TSH, T4TOTAL, FREET4, T3FREE, THYROIDAB in the last 72  hours. Anemia Panel: No results for input(s): VITAMINB12, FOLATE, FERRITIN, TIBC, IRON, RETICCTPCT in the last 72 hours. Urine analysis:    Component Value Date/Time   COLORURINE YELLOW 10/22/2017 1041  APPEARANCEUR CLEAR 10/22/2017 1041   LABSPEC 1.015 10/22/2017 1041   PHURINE 5.0 10/22/2017 1041   GLUCOSEU NEGATIVE 10/22/2017 1041   HGBUR MODERATE (A) 10/22/2017 1041   [place] 10/22/2017 [place] 10/22/2017 1041   PROTEINUR NEGATIVE 02/13/2018 0749   UROBILINOGEN 0.2 03/22/2014 0917   NITRITE POSITIVE (A) 10/22/2017 1041   LEUKOCYTESUR SMALL (A) 10/22/2017 1041   Sepsis Labs: @LABRCNTIP (procalcitonin:4,lacticidven:4) )No results found for this or any previous visit (from the past 240 hour(s)).   Radiological Exams on Admission: Dg Tibia/fibula Right  Result Date: 03/08/2018 CLINICAL DATA:  Ankle fracture EXAM: RIGHT TIBIA AND FIBULA - 2 VIEW COMPARISON:  None. FINDINGS: Acute mildly displaced distal fibular fracture. Diffuse soft tissue swelling. Probable cortical avulsion off the distal anterior aspect of the tibia. IMPRESSION: 1. Acute mildly displaced distal fibular fracture 2. Probable acute mildly displaced fracture off the anterior distal cortical surface of the tibia Electronically Signed   By: Donavan Foil M.D.   On: 03/08/2018 19:09   Dg Ankle Complete Right  Result Date: 03/08/2018 CLINICAL DATA:  Injury EXAM: RIGHT ANKLE - COMPLETE 3+ VIEW COMPARISON:  None. FINDINGS: Acute fracture involving the distal metaphysis of the fibula with less than 1/4 bone with lateral displacement of distal fracture fragment. Mortise is symmetric. Suspected small avulsion fracture injury involving the distal anterior inferior aspect of the tibia. Small plantar calcaneal spur. Diffuse soft tissue swelling. [place] or old injury adjacent to the tip of the fibular malleolus. IMPRESSION: 1. Acute mildly displaced distal fibular fracture 2. Probable acute cortical  avulsion injury off the distal anterior tibia. Electronically Signed   By: Donavan Foil M.D.   On: 03/08/2018 19:08     EKG: Independently reviewed.  Sinus rhythm, QTC 441, early R wave progression, no ischemic change.  Assessment/Plan Principal Problem:   Displaced fracture of distal end of right fibula Active Problems:   Essential hypertension   Ascites   Disseminated ovarian cancer (HCC)   Liver cirrhosis secondary to NASH (HCC)   Restless legs syndrome   Pancytopenia, acquired ([place])   Asthma   Primary peritoneal carcinomatosis ([place])   Diabetes mellitus without complication (HCC)   Thrombocytopenia ([place])   GIB (gastrointestinal bleeding)   Fall   Chronic diastolic CHF (congestive heart failure) ([place])   Displaced fracture of distal end of right fibula:  As evidenced by x-ray. Patient has moderate pain now. No neurovascular compromise. Orthopedic surgeon, Dr. Erlinda Hong was consulted-->plans to do surgery tomorrow. - will admit to Med-surg bed - Pain control: morphine prn and Norco - When necessary Zofran for nausea - Robaxin for muscle spasm - Appreciated Dr. Erlinda Hong consultation - type and cross - INR/PTT - PT/OT when able to (not ordered now)  Fall: Patient seems to have had a mechanical fall.  She strongly denies any head or neck injury. Does not want to do CT of head and neck.  -pt/ot when able to  RLS: -Rotigotine  HTN:  -Continue home medications: HCTZ -IV hydralazine prn  Liver cirrhosis secondary to NASH, ascites and esophageal varices: Patient's mental status normal.  No active bleeding. INR 1.20.  Mental status normal. -will check PTT -will check ammonia level -pt is scheduled for esophageal banding procedure by Dr. 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 Addison Bailey. She was on chemotherapy before she had a GI bleeding 4 weeks  ago. -  Follow-up with Dr. Addison Bailey  Pancytopenia, acquired and Thrombocytopenia: Hemoglobin stable 11.1.  Platelet is 67.  No active bleeding. -Follow-up by CBC  Asthma: stable -prn albuterol   Diabetes mellitus without complication ([place]): Last A1c 6.3 on 01/28/14, well controled. Patient is taking degludec at home -will decrease degludec insulin dose from 14 to 9 units daily -SSI  Hx of GIB (gastrointestinal bleeding): No active rectal bleeding -Increase the Protonix dose 40 mg daily to twice daily    Perioperative Cardiac Risk: He has multiple comorbidities, including hypertension, diabetes mellitus, asthma, Nash, liver cirrhosis, portal hypertension, GIB, CAD, non-STEMI, disseminated ovarian cancer with peritoneal carcinomatosis (stopped chemotherapy 4 weeks ago), thrombocytopenia.  Patient does not have chest pain or shortness of breath.  2D echo on 03/17/2017 showed EF of 55 to 60% with grade 2 diastolic dysfunction.  Patient does not have leg edema or JVD.  Does not have signs of CHF exacerbation. Will get BNP. EKG showed early R wave progression, no other ischemic change. At this time point, no further work up is needed. Currently patient is active and independent of his ADLs, IADLs. Pt has hx of NASH, liver cirrhosis, portal hypertension, esophageal varices. She had recent GI bleeding required 2 units of blood transfusion.  Currently hemoglobin stable 11.1.  No rectal bleeding.  Patient's highest risk is probably GI bleeding.  I will increase her Protonix dose from 40 mg daily to 40 mg twice daily. His GUPTA score perioperative myocardial infarction or cardaic arrest is 1.76 % which is moderate risk.   Inpatient status:  # Patient requires inpatient status due to high intensity of service, high risk for further deterioration and high frequency of surveillance required.  I certify that at the point of admission it is my clinical judgment that the patient will require inpatient hospital care  spanning beyond 2 midnights from the point of admission.  . This patient has multiple chronic comorbidities including  hypertension, diabetes mellitus, asthma, Nash, liver cirrhosis, portal hypertension, GIB, CAD, non-STEMI, disseminated ovarian cancer with peritoneal carcinomatosis (stopped chemotherapy 4 weeks ago), thrombocytopenia.  . Now patient has presenting with fall and right ankle fracture. . The worrisome physical exam findings include right ankle tenderness . The initial radiographic and laboratory data are worrisome because of right ankle fracture . Current medical needs: please see my assessment and plan . Predictability of an adverse outcome (risk): Patient has multiple comorbidities, now presents with right ankle fracture, will need surgery.  Patient carries moderate risk for surgery.  Patient will need to stay in hospital for at least 2 days.       DVT ppx: SCD Code Status: Full code Family Communication: None at bed side.    Disposition Plan: to be detemined Consults called:  Ortho, Dr. Erlinda Hong Admission status:    medical floor/obs        Date of Service 03/09/2018    [place] [facility]   If 7PM-7AM, please contact night-coverage [email or web address] Password [email or web address] 03/09/2018, 2:19 AM

## 2018-03-08 NOTE — ED Notes (Signed)
Disk brought to xray to try and upload files.

## 2018-03-08 NOTE — ED Provider Notes (Signed)
[facility] DEPT Provider Note   CSN: [id number] Arrival date & time: 03/08/18  1718     History   Chief Complaint Chief Complaint  Patient presents with  . Ankle Pain    HPI Olivia Benson is a 73 y.o. female.  She is here for evaluation of a right ankle fracture.  She said she twisted her ankle going down the garage stairs around 1 PM today.  Since then she really has not been able to ambulate on it.  She denies any other injury.  She went to an urgent care where they shot an x-ray and found to have a distal fibular fracture.  They placed her in a walking boot and referred her here for further evaluation.  She rates the pain as 10 out of 10 and is across the lateral and anterior ankle.  Not associated with any numbness.  She denies any other injury or complaints.  The history is provided by the patient.  Ankle Pain  Location:  Ankle Injury: yes   Mechanism of injury: fall   Fall:    Fall occurred:  Down stairs and tripped   Height of fall:  6 inches   Impact surface:  Stairs Ankle location:  R ankle Pain details:    Quality:  Sharp   Radiates to:  Does not radiate   Severity:  Severe   Onset quality:  Sudden   Timing:  Constant   Progression:  Unchanged Chronicity:  New Dislocation: no   Foreign body present:  No foreign bodies Tetanus status:  Unknown Prior injury to area:  No Relieved by:  Nothing Worsened by:  Bearing weight Ineffective treatments:  Rest Associated symptoms: no back pain, no fever, no neck pain, no numbness and no tingling     Past Medical History:  Diagnosis Date  . Asthma   . Cirrhosis ([place])   . Complication of anesthesia    slow to wake up / n & v  . Diabetes mellitus   . Elevated LFTs   . Family history of adverse reaction to anesthesia    multiple family members have had problems such as slow to wake / "flat lined" /  ventilator    . Frequency of urination   . Glaucoma, narrow-angle   . History of skin  cancer   . Hypertension   . Non-ST elevated myocardial infarction (non-STEMI) [facility]) Sept 2012   Mild elevation in troponin and CK MB but no evidence of coronary disease at time of cath  . Obesity   . Ovarian cancer ([place]) 11/22/2013   Disseminated ovarian cancer  . Overactive bladder   . PONV (postoperative nausea and vomiting)   . Portal hypertension ([place])   . Restless leg syndrome   . Tingling    hands and feet    Patient Active Problem List   Diagnosis Date Noted  . GIB (gastrointestinal bleeding) 02/09/2018  . Obesity due to excess calories 03/20/2017  . Cardiomyopathy ([place]) 01/24/2017  . Sinus congestion 01/24/2017  . 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 Chemotherapy-induced nausea 10/24/2016  . Goals of care, counseling/discussion 09/03/2016  . Thrombocytopenia ([place]) 04/15/2016  . Hot flashes due to tamoxifen 04/15/2016  . Vasomotor instability 01/17/2016  . Osteopenia determined by x-ray 12/22/2015  . Encounter for antineoplastic chemotherapy 12/09/2015  . 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 Elevated tumor markers 10/01/2015  . Obesity (BMI 35.0-39.9 without comorbidity) 05/31/2015  . Port catheter in place 03/29/2015  .  Iron deficiency anemia 03/05/2015  . Low back pain 03/05/2015  . Neuropathic pain of foot 11/21/2014  . Portacath in place 05/10/2014  . Leukopenia due to antineoplastic chemotherapy ([place]) 05/10/2014  . Splenomegaly 05/10/2014  . 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 Genetic testing 03/16/2014  . Neutropenic fever ([place]) 01/27/2014  . Pancytopenia, acquired ([place]) 01/27/2014  . Fever and neutropenia ([place]) 01/27/2014  . Asthma 01/27/2014  . Malignant ascites 01/25/2014  . Chemotherapy induced neutropenia ([place]) 01/25/2014  . Chemotherapy induced thrombocytopenia 01/25/2014  . Restless legs syndrome 01/25/2014  . Liver cirrhosis secondary to NASH ([place]) 01/02/2014  . 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 Ascites 11/15/2013  . Elevated cancer antigen 125 (CA-125) 11/15/2013  . Cough variant asthma vs UACS 09/02/2013  . Essential hypertension 09/02/2013  . Dyspnea 10/22/2012  . Fatty liver disease, nonalcoholic 40/81/4481  . S/P cardiac cath 11/06/2010    Past Surgical History:  Procedure Laterality Date  . ABDOMINAL ULTRASOUND  Sept 2012   No gallbladder disease, + fatty liver  . CARDIAC CATHETERIZATION  10/25/2010   EF 60%; Normal coronaries  . COLONOSCOPY  10/18/2005  . COSMETIC SURGERY  2003  . DENTAL SURGERY    . ESOPHAGEAL BANDING  02/10/2018   Procedure: ESOPHAGEAL BANDING;  Surgeon: Ronnette Juniper, MD;  Location: [facility] ENDOSCOPY;  Service: Gastroenterology;;  . ESOPHAGOGASTRODUODENOSCOPY (EGD) WITH PROPOFOL N/A 02/10/2018   Procedure: ESOPHAGOGASTRODUODENOSCOPY (EGD) WITH PROPOFOL;  Surgeon: Ronnette Juniper, MD;  Location: [facility] ENDOSCOPY;  Service: Gastroenterology;  Laterality: N/A;  . EYE SURGERY Bilateral 09/19/14 R eye, 08/2014 L eye   open angles in both eyes d/t narrow angle glaucoma  . LAPAROTOMY N/A 03/29/2014   Procedure: EXPLORATORY LAPAROTOMY;  Surgeon: Everitt Amber, MD;  Location: [facility] ORS;  Service: Gynecology;  Laterality: N/A;  . PARTIAL HYSTERECTOMY  1988  . SALPINGOOPHORECTOMY Bilateral 03/29/2014   Procedure: BILATERAL SALPINGO OOPHORECTOMY, OMENTECTOMY, DEBULKING;  Surgeon: Everitt Amber, MD;  Location: [facility] ORS;  Service: Gynecology;  Laterality: Bilateral;  . TONSILLECTOMY  1971  . TUBAL LIGATION    . WH-MAMMOGRAPHY  11/24/2009     OB History   No obstetric history on file.      Home Medications    Prior to Admission medications   Medication Sig Start Date End Date Taking? Authorizing Provider  cholecalciferol (VITAMIN D) 1000 units tablet Take 1,000 Units by mouth daily.    [provider]  gabapentin (NEURONTIN) 300 MG capsule Take 2 capsules (600 mg total) by  mouth 2 (two) times daily. 06/30/17   [name], MD  hydrochlorothiazide (MICROZIDE) 12.5 MG capsule Take 12.5 mg by mouth daily. 05/21/17   [provider]  HYDROcodone-acetaminophen (NORCO/VICODIN) 5-325 MG tablet take 1 tablet by mouth every 6 hours if needed for MODERATE PAIN 02/27/18   [name], MD  insulin degludec (TRESIBA FLEXTOUCH) 100 UNIT/ML SOPN FlexTouch Pen Inject 18 Units into the skin daily.    [provider]  lidocaine-prilocaine (EMLA) cream Apply to Porta-cath  1-2 hours prior to access as directed. 11/13/17   [name], MD  ondansetron (ZOFRAN) 8 MG tablet Take 1 tablet (8 mg total) by mouth every 8 (eight) hours as needed for nausea. 10/24/16   [name], MD  pantoprazole (PROTONIX) 40 MG tablet Take 40 mg by mouth daily.  04/26/15   [provider]  propranolol (INDERAL) 20 MG tablet Take 1 tablet (20 mg total) by mouth 2 (two)  times daily. 02/12/18   Regalado, Belkys A, MD  rotigotine (NEUPRO) 4 MG/24HR Place 1 patch onto the skin daily. 12/31/17   TatEustace Quail, DO    Family History Family History  Problem Relation Age of Onset  . Asthma Mother   . Diabetes Mother   . Hypertension Mother   . Allergies Mother   . Rheum arthritis Mother   . Bladder Cancer Father 74       former heavy smoker  . Hypertension Father   . Stroke Father   . Breast cancer Sister 74  . Diabetes Sister        x 3  . Allergies Sister   . Asthma Sister        x 2   . Breast cancer Maternal Aunt 85  . Breast cancer Cousin 61       (maternal) bilateral cancer  . Aneurysm Son 27  . Diabetes Brother   . Allergies Brother   . Asthma Brother     Social History Social History   Tobacco Use  . Smoking status: Former Smoker    Packs/day: 1.00    Years: 1.00    Pack years: 1.00    Types: Cigarettes    Last attempt to quit: 02/11/1982    Years since quitting: 36.0  . Smokeless tobacco: Never Used  Substance Use Topics  . Alcohol use: No    Alcohol/week:  0.0 standard drinks  . Drug use: No     Allergies   Shellfish allergy; Benadryl [diphenhydramine hcl]; Penicillins; and Tape   Review of Systems Review of Systems  Constitutional: Negative for fever.  HENT: Negative for sore throat.   Eyes: Negative for visual disturbance.  Respiratory: Negative for shortness of breath.   Cardiovascular: Negative for chest pain.  Gastrointestinal: Negative for abdominal pain.  Genitourinary: Negative for dysuria.  Musculoskeletal: Negative for back pain and neck pain.  Skin: Negative for rash and wound.  Neurological: Negative for headaches.     Physical Exam Updated Vital Signs BP (!) 151/69 (BP Location: Left Arm)   Pulse 62   Temp 98 F (36.7 C) (Oral)   Resp 18   Ht 5' 2"  (1.575 m)   Wt 82.5 kg   SpO2 100%   BMI 33.27 kg/m   Physical Exam Constitutional:      Appearance: She is well-developed.  HENT:     Head: Normocephalic and atraumatic.  Eyes:     Conjunctiva/sclera: Conjunctivae normal.  Neck:     Musculoskeletal: Neck supple.  Musculoskeletal:        General: Swelling, tenderness and signs of injury present.     Comments: She is no knee or proximal fibular tenderness.  She is tender over the lateral malleolus.  There is no medial malleolar tenderness.  No proximal fifth metatarsal tenderness.  No midfoot instability or pain.  The stitches easily.  Cap refill brisk and sensory intact to light touch.  DP pulse brisk.  No open wounds.  Skin:    General: Skin is warm and dry.  Neurological:     Mental Status: She is alert.     GCS: GCS eye subscore is 4. GCS verbal subscore is 5. GCS motor subscore is 6.      ED Treatments / Results  Labs (all labs ordered are listed, but only abnormal results are displayed) Labs Reviewed  BASIC METABOLIC PANEL - Abnormal; Notable for the following components:      Result Value  Glucose, Bld 113 (*)    Calcium 8.8 (*)    All other components within normal limits  CBC WITH  DIFFERENTIAL/PLATELET - Abnormal; Notable for the following components:   WBC 3.5 (*)    RBC 3.55 (*)    Hemoglobin 11.1 (*)    MCV 101.4 (*)    RDW 18.8 (*)    Platelets 67 (*)    All other components within normal limits  PROTIME-INR    EKG None  Radiology Dg Tibia/fibula Right  Result Date: 03/08/2018 CLINICAL DATA:  Ankle fracture EXAM: RIGHT TIBIA AND FIBULA - 2 VIEW COMPARISON:  None. FINDINGS: Acute mildly displaced distal fibular fracture. Diffuse soft tissue swelling. Probable cortical avulsion off the distal anterior aspect of the tibia. IMPRESSION: 1. Acute mildly displaced distal fibular fracture 2. Probable acute mildly displaced fracture off the anterior distal cortical surface of the tibia Electronically Signed   By: Donavan Foil M.D.   On: 03/08/2018 19:09   Dg Ankle Complete Right  Result Date: 03/08/2018 CLINICAL DATA:  Injury EXAM: RIGHT ANKLE - COMPLETE 3+ VIEW COMPARISON:  None. FINDINGS: Acute fracture involving the distal metaphysis of the fibula with less than 1/4 bone with lateral displacement of distal fracture fragment. [place] is symmetric. Suspected small avulsion fracture injury involving the distal anterior inferior aspect of the tibia. Small plantar calcaneal spur. Diffuse soft tissue swelling. [place] or old injury adjacent to the tip of the fibular malleolus. IMPRESSION: 1. Acute mildly displaced distal fibular fracture 2. Probable acute cortical avulsion injury off the distal anterior tibia. Electronically Signed   By: Donavan Foil M.D.   On: 03/08/2018 19:08    Procedures Procedures (including critical care time)  Medications Ordered in ED Medications  HYDROcodone-acetaminophen (NORCO/VICODIN) 5-325 MG per tablet 2 tablet (2 tablets Oral Given 03/08/18 1904)  ondansetron (ZOFRAN-ODT) disintegrating tablet 8 mg (8 mg Oral Given 03/08/18 1910)  HYDROcodone-acetaminophen (NORCO/VICODIN) 5-325 MG per tablet 2 tablet (2 tablets Oral Given 03/08/18 2234)       Initial Impression / Assessment and Plan / ED Course  I have reviewed the triage vital signs and the nursing notes.  Pertinent labs & imaging results that were available during my care of the patient were reviewed by me and considered in my medical decision making (see chart for details).  Clinical Course as of Mar 08 2309  Sun Mar 08, 2018  1933 Discussed with Dr. Erlinda Hong from orthopedics.  He said if the patient is able to go home to place her in a [place] and if not then he would consider having the medicine team admit her over her account and he may fix her tomorrow.   [MB]  2017 Patient does not feel she is going to be able to manage crutches and stairs at home.  Dr. Erlinda Hong recommended admit the patient to medical team and have her admitted over a call for anticipation of surgery tomorrow.   [MB]  2155 Patient was splinted by the [facility].  CSMs are intact after splint application.  She said her pain is coming back and so we will order another dose of hydrocodone.  Waiting for lab results to come back so I can talk to the hospitalist regarding admission   [MB]  2300 Discussed with Dr. Blaine Hamper from the hospitalist service who will evaluate the patient for admission.   [MB]    Clinical Course User Index [MB] Hayden Rasmussen, MD     Final Clinical Impressions(s) / ED Diagnoses  Final diagnoses:  Closed fracture of distal end of right fibula, unspecified fracture morphology, initial encounter    ED Discharge Orders    None       Hayden Rasmussen, MD 03/08/18 2312

## 2018-03-09 ENCOUNTER — Other Ambulatory Visit: Payer: Self-pay

## 2018-03-09 ENCOUNTER — Inpatient Hospital Stay (HOSPITAL_COMMUNITY): Payer: Medicare Other

## 2018-03-09 ENCOUNTER — Inpatient Hospital Stay (HOSPITAL_COMMUNITY): Payer: Medicare Other | Admitting: Anesthesiology

## 2018-03-09 ENCOUNTER — Encounter (HOSPITAL_COMMUNITY)
Admission: EM | Disposition: A | Discharge status: Home Health | Payer: Self-pay | Source: Home / Self Care | Attending: Internal Medicine

## 2018-03-09 DIAGNOSIS — I5032 Chronic diastolic (congestive) heart failure: Secondary | ICD-10-CM | POA: Diagnosis present

## 2018-03-09 HISTORY — PX: ORIF ANKLE FRACTURE: SHX5408

## 2018-03-09 LAB — GLUCOSE, CAPILLARY
Glucose-Capillary: 113 mg/dL — ABNORMAL HIGH (ref 70–99)
Glucose-Capillary: 96 mg/dL (ref 70–99)
Glucose-Capillary: 73 mg/dL (ref 70–99)
Glucose-Capillary: 88 mg/dL (ref 70–99)
Glucose-Capillary: 264 mg/dL — ABNORMAL HIGH (ref 70–99)

## 2018-03-09 LAB — SURGICAL PCR SCREEN
MRSA, PCR: NEGATIVE
Staphylococcus aureus: POSITIVE — AB

## 2018-03-09 SURGERY — OPEN REDUCTION INTERNAL FIXATION (ORIF) ANKLE FRACTURE
Anesthesia: General | Site: Ankle | Laterality: Right

## 2018-03-09 MED ORDER — ONDANSETRON HCL 4 MG/2ML IJ SOLN
INTRAMUSCULAR | Status: AC
  Filled 2018-03-09: qty 2

## 2018-03-09 MED ORDER — OXYCODONE HCL 5 MG PO TABS: 5.0000 mg | ORAL_TABLET | Freq: Once | ORAL | Status: DC | PRN

## 2018-03-09 MED ORDER — METOCLOPRAMIDE HCL 5 MG/ML IJ SOLN: 5.0000 mg | Freq: Three times a day (TID) | INTRAMUSCULAR | Status: DC | PRN

## 2018-03-09 MED ORDER — ACETAMINOPHEN 500 MG PO TABS
1000.0000 mg | ORAL_TABLET | Freq: Four times a day (QID) | ORAL | Status: AC
  Administered 2018-03-09 – 2018-03-10 (×4): 1000 mg via ORAL
  Filled 2018-03-09 (×2): qty 2

## 2018-03-09 MED ORDER — OXYCODONE HCL ER 10 MG PO T12A
10.0000 mg | EXTENDED_RELEASE_TABLET | Freq: Two times a day (BID) | ORAL | Status: DC
  Administered 2018-03-10 – 2018-03-11 (×3): 10 mg via ORAL
  Filled 2018-03-09 (×4): qty 1

## 2018-03-09 MED ORDER — CHLORHEXIDINE GLUCONATE CLOTH 2 % EX PADS
6.0000 | MEDICATED_PAD | Freq: Every day | CUTANEOUS | Status: DC
  Administered 2018-03-10 – 2018-03-11 (×2): 6 via TOPICAL

## 2018-03-09 MED ORDER — FENTANYL CITRATE (PF) 100 MCG/2ML IJ SOLN: 25.0000 ug | INTRAMUSCULAR | Status: DC | PRN

## 2018-03-09 MED ORDER — BUPIVACAINE HCL (PF) 0.25 % IJ SOLN
INTRAMUSCULAR | Status: AC
  Filled 2018-03-09: qty 30

## 2018-03-09 MED ORDER — DEXAMETHASONE SODIUM PHOSPHATE 10 MG/ML IJ SOLN
INTRAMUSCULAR | Status: AC
  Filled 2018-03-09: qty 1

## 2018-03-09 MED ORDER — METHOCARBAMOL 500 MG PO TABS
500.0000 mg | ORAL_TABLET | Freq: Four times a day (QID) | ORAL | Status: DC | PRN
  Administered 2018-03-11 (×2): 500 mg via ORAL
  Filled 2018-03-09 (×2): qty 1

## 2018-03-09 MED ORDER — EPHEDRINE SULFATE 50 MG/ML IJ SOLN
INTRAMUSCULAR | Status: DC | PRN
  Administered 2018-03-09: 10 mg via INTRAVENOUS

## 2018-03-09 MED ORDER — CEFAZOLIN SODIUM-DEXTROSE 1-4 GM/50ML-% IV SOLN
INTRAVENOUS | Status: DC | PRN
  Administered 2018-03-09: 2 g via INTRAVENOUS

## 2018-03-09 MED ORDER — OXYCODONE HCL 5 MG/5ML PO SOLN: 5.0000 mg | Freq: Once | ORAL | Status: DC | PRN

## 2018-03-09 MED ORDER — POLYETHYLENE GLYCOL 3350 17 G PO PACK: 17.0000 g | PACK | Freq: Every day | ORAL | Status: DC | PRN

## 2018-03-09 MED ORDER — GLYCOPYRROLATE 0.2 MG/ML IJ SOLN
INTRAMUSCULAR | Status: DC | PRN
  Administered 2018-03-09: .2 mg via INTRAVENOUS

## 2018-03-09 MED ORDER — ONDANSETRON HCL 4 MG/2ML IJ SOLN
INTRAMUSCULAR | Status: DC | PRN
  Administered 2018-03-09: 4 mg via INTRAVENOUS

## 2018-03-09 MED ORDER — CEFAZOLIN SODIUM-DEXTROSE 2-4 GM/100ML-% IV SOLN
2.0000 g | Freq: Four times a day (QID) | INTRAVENOUS | Status: AC
  Administered 2018-03-09 – 2018-03-10 (×3): 2 g via INTRAVENOUS
  Filled 2018-03-09 (×3): qty 100

## 2018-03-09 MED ORDER — PROPOFOL 10 MG/ML IV BOLUS
INTRAVENOUS | Status: AC
  Filled 2018-03-09: qty 40

## 2018-03-09 MED ORDER — PROPOFOL 10 MG/ML IV BOLUS
INTRAVENOUS | Status: DC | PRN
  Administered 2018-03-09: 120 mg via INTRAVENOUS

## 2018-03-09 MED ORDER — BUPIVACAINE HCL (PF) 0.25 % IJ SOLN: INTRAMUSCULAR | Status: DC | PRN

## 2018-03-09 MED ORDER — LIDOCAINE 2% (20 MG/ML) 5 ML SYRINGE
INTRAMUSCULAR | Status: AC
  Filled 2018-03-09: qty 5

## 2018-03-09 MED ORDER — MUPIROCIN 2 % EX OINT
1.0000 "application " | TOPICAL_OINTMENT | Freq: Two times a day (BID) | CUTANEOUS | Status: DC
  Administered 2018-03-09 – 2018-03-11 (×5): 1 via NASAL
  Filled 2018-03-09 (×2): qty 22

## 2018-03-09 MED ORDER — CEFAZOLIN SODIUM 1 G IJ SOLR
INTRAMUSCULAR | Status: AC
  Filled 2018-03-09: qty 20

## 2018-03-09 MED ORDER — ENOXAPARIN SODIUM 40 MG/0.4ML ~~LOC~~ SOLN: 40.0000 mg | SUBCUTANEOUS | Status: DC

## 2018-03-09 MED ORDER — DOCUSATE SODIUM 100 MG PO CAPS
100.0000 mg | ORAL_CAPSULE | Freq: Two times a day (BID) | ORAL | Status: DC
  Administered 2018-03-09 – 2018-03-11 (×4): 100 mg via ORAL
  Filled 2018-03-09 (×4): qty 1

## 2018-03-09 MED ORDER — SORBITOL 70 % SOLN: 30.0000 mL | Freq: Every day | Status: DC | PRN

## 2018-03-09 MED ORDER — PANTOPRAZOLE SODIUM 40 MG PO TBEC
40.0000 mg | DELAYED_RELEASE_TABLET | Freq: Two times a day (BID) | ORAL | Status: DC
  Administered 2018-03-09 – 2018-03-11 (×6): 40 mg via ORAL
  Filled 2018-03-09 (×5): qty 1

## 2018-03-09 MED ORDER — ACETAMINOPHEN 325 MG PO TABS: 325.0000 mg | ORAL_TABLET | Freq: Four times a day (QID) | ORAL | Status: DC | PRN

## 2018-03-09 MED ORDER — ONDANSETRON HCL 4 MG/2ML IJ SOLN: 4.0000 mg | Freq: Once | INTRAMUSCULAR | Status: DC | PRN

## 2018-03-09 MED ORDER — OXYCODONE HCL 5 MG PO TABS
5.0000 mg | ORAL_TABLET | ORAL | Status: DC | PRN
  Administered 2018-03-11: 10 mg via ORAL
  Filled 2018-03-09: qty 2

## 2018-03-09 MED ORDER — ROPIVACAINE HCL 7.5 MG/ML IJ SOLN
INTRAMUSCULAR | Status: DC | PRN
  Administered 2018-03-09: 20 mL via PERINEURAL

## 2018-03-09 MED ORDER — MIDAZOLAM HCL 2 MG/2ML IJ SOLN
INTRAMUSCULAR | Status: AC
  Filled 2018-03-09: qty 2

## 2018-03-09 MED ORDER — LACTATED RINGERS IV SOLN
INTRAVENOUS | Status: DC | PRN
  Administered 2018-03-09 (×2): via INTRAVENOUS

## 2018-03-09 MED ORDER — HYDROMORPHONE HCL 1 MG/ML IJ SOLN
0.5000 mg | INTRAMUSCULAR | Status: DC | PRN
  Administered 2018-03-11: 1 mg via INTRAVENOUS
  Filled 2018-03-09: qty 1

## 2018-03-09 MED ORDER — LIDOCAINE 2% (20 MG/ML) 5 ML SYRINGE
INTRAMUSCULAR | Status: DC | PRN
  Administered 2018-03-09: 40 mg via INTRAVENOUS

## 2018-03-09 MED ORDER — FENTANYL CITRATE (PF) 100 MCG/2ML IJ SOLN
INTRAMUSCULAR | Status: DC | PRN
  Administered 2018-03-09: 50 ug via INTRAVENOUS

## 2018-03-09 MED ORDER — ONDANSETRON HCL 4 MG/2ML IJ SOLN: 4.0000 mg | Freq: Four times a day (QID) | INTRAMUSCULAR | Status: DC | PRN

## 2018-03-09 MED ORDER — MIDAZOLAM HCL 5 MG/5ML IJ SOLN
INTRAMUSCULAR | Status: DC | PRN
  Administered 2018-03-09: 1 mg via INTRAVENOUS

## 2018-03-09 MED ORDER — PROPOFOL 500 MG/50ML IV EMUL
INTRAVENOUS | Status: DC | PRN
  Administered 2018-03-09: 10 ug/kg/min via INTRAVENOUS

## 2018-03-09 MED ORDER — METHOCARBAMOL 1000 MG/10ML IJ SOLN
500.0000 mg | Freq: Four times a day (QID) | INTRAVENOUS | Status: DC | PRN
  Filled 2018-03-09: qty 5

## 2018-03-09 MED ORDER — MAGNESIUM CITRATE PO SOLN: 1.0000 | Freq: Once | ORAL | Status: DC | PRN

## 2018-03-09 MED ORDER — ONDANSETRON HCL 4 MG PO TABS: 4.0000 mg | ORAL_TABLET | Freq: Four times a day (QID) | ORAL | Status: DC | PRN

## 2018-03-09 MED ORDER — FENTANYL CITRATE (PF) 250 MCG/5ML IJ SOLN
INTRAMUSCULAR | Status: AC
  Filled 2018-03-09: qty 5

## 2018-03-09 MED ORDER — 0.9 % SODIUM CHLORIDE (POUR BTL) OPTIME
TOPICAL | Status: DC | PRN
  Administered 2018-03-09 (×3): 1000 mL

## 2018-03-09 MED ORDER — OXYCODONE HCL 5 MG PO TABS: 10.0000 mg | ORAL_TABLET | ORAL | Status: DC | PRN

## 2018-03-09 MED ORDER — DEXAMETHASONE SODIUM PHOSPHATE 10 MG/ML IJ SOLN
INTRAMUSCULAR | Status: DC | PRN
  Administered 2018-03-09: 10 mg via INTRAVENOUS

## 2018-03-09 MED ORDER — SODIUM CHLORIDE 0.9 % IV SOLN
INTRAVENOUS | Status: DC
  Administered 2018-03-09: 18:00:00 via INTRAVENOUS

## 2018-03-09 MED ORDER — METOCLOPRAMIDE HCL 5 MG PO TABS: 5.0000 mg | ORAL_TABLET | Freq: Three times a day (TID) | ORAL | Status: DC | PRN

## 2018-03-09 SURGICAL SUPPLY — 58 items
BANDAGE ACE 4X5 VEL STRL LF (GAUZE/BANDAGES/DRESSINGS) ×2 IMPLANT
BANDAGE ACE 6X5 VEL STRL LF (GAUZE/BANDAGES/DRESSINGS) ×2 IMPLANT
BANDAGE ESMARK 6X9 LF (GAUZE/BANDAGES/DRESSINGS) ×1 IMPLANT
BIT DRILL QC 2.5MM SHRT EVO SM (DRILL) ×1 IMPLANT
BLADE SURG 15 STRL LF DISP TIS (BLADE) ×1 IMPLANT
BLADE SURG 15 STRL SS (BLADE) ×2
BNDG CMPR 9X6 STRL LF SNTH (GAUZE/BANDAGES/DRESSINGS) ×1
BNDG COHESIVE 4X5 TAN STRL (GAUZE/BANDAGES/DRESSINGS) ×2 IMPLANT
BNDG ESMARK 6X9 LF (GAUZE/BANDAGES/DRESSINGS) ×2
CANISTER SUCT 3000ML PPV (MISCELLANEOUS) ×2 IMPLANT
COVER SURGICAL LIGHT HANDLE (MISCELLANEOUS) ×2 IMPLANT
COVER WAND RF STERILE (DRAPES) ×2 IMPLANT
CUFF TOURNIQUET SINGLE 34IN LL (TOURNIQUET CUFF) ×2 IMPLANT
DECANTER SPIKE VIAL GLASS SM (MISCELLANEOUS) ×2 IMPLANT
DRAPE C-ARM 42X72 X-RAY (DRAPES) ×2 IMPLANT
DRAPE C-ARMOR (DRAPES) ×2 IMPLANT
DRAPE INCISE IOBAN 66X45 STRL (DRAPES) IMPLANT
DRAPE U-SHAPE 47X51 STRL (DRAPES) ×2 IMPLANT
DRILL QC 2.5MM SHORT EVOS SM (DRILL) ×2
DRSG PAD ABDOMINAL 8X10 ST (GAUZE/BANDAGES/DRESSINGS) ×2 IMPLANT
DURAPREP 26ML APPLICATOR (WOUND CARE) ×2 IMPLANT
ELECT CAUTERY BLADE 6.4 (BLADE) ×2 IMPLANT
ELECT REM PT RETURN 9FT ADLT (ELECTROSURGICAL) ×2
ELECTRODE REM PT RTRN 9FT ADLT (ELECTROSURGICAL) ×1 IMPLANT
GAUZE SPONGE 4X4 12PLY STRL (GAUZE/BANDAGES/DRESSINGS) ×2 IMPLANT
GAUZE XEROFORM 5X9 LF (GAUZE/BANDAGES/DRESSINGS) ×2 IMPLANT
GLOVE BIOGEL PI IND STRL 7.0 (GLOVE) ×1 IMPLANT
GLOVE BIOGEL PI INDICATOR 7.0 (GLOVE) ×1
GLOVE ECLIPSE 7.0 STRL STRAW (GLOVE) ×4 IMPLANT
GLOVE SKINSENSE NS SZ7.5 (GLOVE) ×1
GLOVE SKINSENSE STRL SZ7.5 (GLOVE) ×1 IMPLANT
GLOVE SURG SYN 7.5  E (GLOVE) ×2
GLOVE SURG SYN 7.5 E (GLOVE) ×2 IMPLANT
GOWN STRL REIN XL XLG (GOWN DISPOSABLE) ×2 IMPLANT
KIT BASIN OR (CUSTOM PROCEDURE TRAY) ×2 IMPLANT
KIT TURNOVER KIT B (KITS) ×2 IMPLANT
NEEDLE HYPO 25GX1X1/2 BEV (NEEDLE) ×2 IMPLANT
NS IRRIG 1000ML POUR BTL (IV SOLUTION) ×2 IMPLANT
PACK ORTHO EXTREMITY (CUSTOM PROCEDURE TRAY) ×2 IMPLANT
PAD ARMBOARD 7.5X6 YLW CONV (MISCELLANEOUS) ×4 IMPLANT
PAD CAST 4YDX4 CTTN HI CHSV (CAST SUPPLIES) ×1 IMPLANT
PADDING CAST COTTON 4X4 STRL (CAST SUPPLIES) ×2
PADDING CAST COTTON 6X4 STRL (CAST SUPPLIES) ×2 IMPLANT
PLATE 3.5 LOCK 7H 1/3 TUBULAR (Plate) ×2 IMPLANT
SCREW CANC 4.7X10 FT (Screw) ×2 IMPLANT
SCREW CANC 4.7X14MM (Screw) ×2 IMPLANT
SCREW CORT 3.5X10MM ST EVOS (Screw) ×2 IMPLANT
SCREW LOCK ST EVOS 3.5X12 (Screw) ×2 IMPLANT
SCREW OST EVOS FT 4.7X16 (Screw) ×2 IMPLANT
SUCTION FRAZIER HANDLE 10FR (MISCELLANEOUS) ×1
SUCTION TUBE FRAZIER 10FR DISP (MISCELLANEOUS) ×1 IMPLANT
SUT ETHILON 3 0 PS 1 (SUTURE) IMPLANT
SUT VIC AB 2-0 CT1 27 (SUTURE)
SUT VIC AB 2-0 CT1 TAPERPNT 27 (SUTURE) IMPLANT
SYR CONTROL 10ML LL (SYRINGE) ×2 IMPLANT
TOWEL OR 17X26 10 PK STRL BLUE (TOWEL DISPOSABLE) ×2 IMPLANT
TUBE CONNECTING 12X1/4 (SUCTIONS) ×2 IMPLANT
UNDERPAD 30X30 (UNDERPADS AND DIAPERS) ×2 IMPLANT

## 2018-03-09 NOTE — Op Note (Signed)
   Date of Surgery: 03/09/2018  INDICATIONS: Ms. Sidener is a 73 y.o.-year-old female who sustained a right ankle fracture; she was indicated for open reduction and internal fixation due to the displaced nature of the articular fracture and came to the operating room today for this procedure. The patient did consent to the procedure after discussion of the risks and benefits.  PREOPERATIVE DIAGNOSIS: right lateral malleolus ankle fracture  POSTOPERATIVE DIAGNOSIS: Same.  PROCEDURE: Open treatment of right ankle fracture with internal fixation. Lateral malleolar CPT [id number]  SURGEON: N. Eduard Roux, M.D.  ASSIST: Ciro Backer [place], [place]; necessary for the timely completion of procedure and due to complexity of procedure.  ANESTHESIA:  general, regional  TOURNIQUET TIME: less than 60 mins  IV FLUIDS AND URINE: See anesthesia.  ESTIMATED BLOOD LOSS: minimal mL.  IMPLANTS: [employer]  COMPLICATIONS: None.  DESCRIPTION OF PROCEDURE: The patient was brought to the operating room and placed supine on the operating table.  The patient had been signed prior to the procedure and this was documented. The patient had the anesthesia placed by the anesthesiologist.  A nonsterile tourniquet was placed on the upper thigh.  The prep verification and incision time-outs were performed to confirm that this was the correct patient, site, side and location. The patient had an SCD on the opposite lower extremity. The patient did receive antibiotics prior to the incision and was re-dosed during the procedure as needed at indicated intervals.  The patient had the lower extremity prepped and draped in the standard surgical fashion.  The extremity was exsanguinated using an esmarch bandage and the tourniquet was inflated to 300 mm Hg.  An incision was made on the lateral aspect of the distal fibula.  Full-thickness flaps were elevated.  Subperiosteal elevation was performed.  The fracture was exposed.  Reduction  was achieved and confirmed under fluoroscopy.  Of note the bone quality was poor.  Given the orientation of the fracture I was not able to place a lag screw.  Therefore I placed a semitubular plate on the lateral aspect of the fibula at the appropriate position.  I first placed a cancellus screw proximal to the fracture in order to contour the plate down to the bone.  I then placed a series of nonlocking and locking screws using standard AO technique using fluoroscopic guidance.  Stress exam of the ankle showed no widening of the medial clear space.  Final x-rays were taken.  The wound was then thoroughly irrigated with 3 L normal saline.  This was then closed in a layered fashion using 0 Vicryl, 2-0 Vicryl, 3-0 nylon.  Sterile dressings were applied.  The foot was then placed in a cam walker.  POSTOPERATIVE PLAN: Ms. Belford will remain nonweightbearing on this leg for approximately 4 weeks; Ms. Janeway will return for suture removal in 2 weeks.  He will be immobilized in a CAM walker.  Ms. Basinski should receive 2 weeks of lovenox for DVT prophylaxis.  She may begin ankle range of motion as soon as tolerated.  Azucena Cecil, MD [place] 1:49 PM

## 2018-03-09 NOTE — Progress Notes (Signed)
Nutrition Brief Note  Received nutrition consult from the Hip Fracture protocol. Patient admitted with a R ankle fracture s/p fall at home. Currently in the OR for repair surgery.   Patient with no significant weight changes within the past year. Poor intake and weight loss were not reported on admission nutrition screen.   Body mass index is 33.27 kg/m. Patient meets criteria for obesity based on current BMI.   Current diet order is NPO for surgery today. Labs and medications reviewed.   No nutrition interventions warranted at this time. If nutrition issues arise, please consult RD.   Molli Barrows, RD, LDN, [place] Pager [phone] After Hours Pager [phone]

## 2018-03-09 NOTE — Anesthesia Procedure Notes (Signed)
Anesthesia Regional Block: Popliteal block   Pre-Anesthetic Checklist: ,, timeout performed, Correct Patient, Correct Site, Correct Laterality, Correct Procedure, Correct Position, site marked, Risks and benefits discussed,  Surgical consent,  Pre-op evaluation,  At surgeon's request and post-op pain management  Laterality: Right  Prep: chloraprep       Needles:  Injection technique: Single-shot  Needle Type: Echogenic Needle     Needle Length: 10cm  Needle Gauge: 21     Additional Needles:   Narrative:  Start time: 03/09/2018 12:41 PM End time: 03/09/2018 12:45 PM Injection made incrementally with aspirations every 5 mL.  Performed by: Personally  Anesthesiologist: Audry Pili, MD  Additional Notes: No pain on injection. No increased resistance to injection. Injection made in 5cc increments. Good needle visualization. Patient tolerated the procedure well.

## 2018-03-09 NOTE — Anesthesia Procedure Notes (Signed)
Procedure Name: LMA Insertion Date/Time: 03/09/2018 1:53 PM Performed by: Neldon Newport, CRNA Pre-anesthesia Checklist: Timeout performed, Patient being monitored, Suction available, Emergency Drugs available and Patient identified Patient Re-evaluated:Patient Re-evaluated prior to induction Oxygen Delivery Method: Circle system utilized Preoxygenation: Pre-oxygenation with 100% oxygen Induction Type: IV induction Ventilation: Mask ventilation without difficulty LMA: LMA inserted LMA Size: 4.0 Number of attempts: 1 Placement Confirmation: positive ETCO2 and breath sounds checked- equal and bilateral Tube secured with: Tape Dental Injury: Teeth and Oropharynx as per pre-operative assessment

## 2018-03-09 NOTE — ED Notes (Signed)
ED TO INPATIENT HANDOFF REPORT  Name/Age/Gender Olivia Benson 73 y.o. female  Code Status    Code Status Orders  (From admission, onward)         Start     Ordered   03/08/18 2355  Full code  Continuous     03/08/18 2357        Code Status History    Date Active Date Inactive Code Status Order ID Comments User Context   02/09/2018 1925 02/12/2018 1724 Full Code [id number]  Bonnell Public, MD Inpatient   03/29/2014 1454 04/01/2014 1409 Full Code [id number]  Lahoma Crocker, MD Inpatient   03/07/2014 1557 03/08/2014 0341 Full Code [id number]  Greggory Keen, MD HOV   01/27/2014 2205 01/30/2014 1651 Full Code [id number]  Ivor Costa, MD Inpatient      Home/SNF/Other Home  Chief Complaint Ankle Pain   Level of Care/Admitting Diagnosis ED Disposition    ED Disposition Condition [place]: [place] [100100]  Level of Care: Medical Telemetry [104]  Diagnosis: Displaced fracture of distal end of right fibula [8099833]  Admitting Physician: Ivor Costa [[id number]]  Attending Physician: Ivor Costa [phone]  Estimated length of stay: past midnight tomorrow  Certification:: I certify this patient will need inpatient services for at least 2 midnights  PT Class (Do Not Modify): Inpatient [101]  PT Acc Code (Do Not Modify): Private [1]       Medical History Past Medical History:  Diagnosis Date  . Asthma   . Cirrhosis ([place])   . Complication of anesthesia    slow to wake up / n & v  . Diabetes mellitus   . Elevated LFTs   . Family history of adverse reaction to anesthesia    multiple family members have had problems such as slow to wake / "flat lined" /  ventilator    . Frequency of urination   . Glaucoma, narrow-angle   . History of skin cancer   . Hypertension   . Non-ST elevated myocardial infarction (non-STEMI) [facility]) Sept 2012   Mild elevation in troponin and CK MB but no evidence of coronary disease at time of cath  . Obesity    . Ovarian cancer ([place]) 11/22/2013   Disseminated ovarian cancer  . Overactive bladder   . PONV (postoperative nausea and vomiting)   . Portal hypertension ([place])   . Restless leg syndrome   . Tingling    hands and feet    Allergies Allergies  Allergen Reactions  . Shellfish Allergy Anaphylaxis    HER THROAT SWELLS UP AND CLOSES  . Benadryl [Diphenhydramine Hcl] Other (See Comments)    Restless legs   . Penicillins Rash    DID THE REACTION INVOLVE: Swelling of the face/tongue/throat, SOB, or low BP? No Sudden or severe rash/hives, skin peeling, or the inside of the mouth or nose? No Did it require medical treatment? No When did it last happen?Childhood allergy If all above answers are "NO", may proceed with cephalosporin use.   . Tape Itching    Red itch with some tapes    IV Location/Drains/Wounds Patient Lines/Drains/Airways Status   Active Line/Drains/Airways    Name:   Placement date:   Placement time:   Site:   Days:   Implanted Port 03/07/14 Right Chest   03/07/14    1503    Chest   1463   Incision (Closed) 03/29/14 Abdomen   03/29/14    1308  1441          Labs/Imaging Results for orders placed or performed during the hospital encounter of 03/08/18 (from the past 48 hour(s))  Basic metabolic panel     Status: Abnormal   Collection Time: 03/08/18  9:41 PM  Result Value Ref Range   Sodium 141 135 - 145 mmol/L   Potassium 3.7 3.5 - 5.1 mmol/L   Chloride 109 98 - 111 mmol/L   CO2 24 22 - 32 mmol/L   Glucose, Bld 113 (H) 70 - 99 mg/dL   BUN 22 8 - 23 mg/dL   Creatinine, Ser 0.84 0.44 - 1.00 mg/dL   Calcium 8.8 (L) 8.9 - 10.3 mg/dL   GFR calc non Af Amer >60 >60 mL/min   GFR calc Af Amer >60 >60 mL/min   Anion gap 8 5 - 15    Comment: Performed at [facility], [place] [address], [place], [place] [address]  CBC with Differential     Status: Abnormal   Collection Time: 03/08/18  9:41 PM  Result Value Ref Range   WBC 3.5 (L) 4.0 -  10.5 K/uL   RBC 3.55 (L) 3.87 - 5.11 MIL/uL   Hemoglobin 11.1 (L) 12.0 - 15.0 g/dL   HCT 36.0 36.0 - 46.0 %   MCV 101.4 (H) 80.0 - 100.0 fL   MCH 31.3 26.0 - 34.0 pg   MCHC 30.8 30.0 - 36.0 g/dL   RDW 18.8 (H) 11.5 - 15.5 %   Platelets 67 (L) 150 - 400 K/uL    Comment: REPEATED TO VERIFY PLATELET COUNT CONFIRMED BY SMEAR SPECIMEN CHECKED FOR CLOTS Immature Platelet Fraction may be clinically indicated, consider ordering this additional test [id number]    nRBC 0.0 0.0 - 0.2 %   Neutrophils Relative % 58 %   Neutro Abs 2.0 1.7 - 7.7 K/uL   Lymphocytes Relative 34 %   Lymphs Abs 1.2 0.7 - 4.0 K/uL   Monocytes Relative 5 %   Monocytes Absolute 0.2 0.1 - 1.0 K/uL   Eosinophils Relative 3 %   Eosinophils Absolute 0.1 0.0 - 0.5 K/uL   Basophils Relative 0 %   Basophils Absolute 0.0 0.0 - 0.1 K/uL   Immature Granulocytes 0 %   Abs Immature Granulocytes 0.01 0.00 - 0.07 K/uL    Comment: Performed at [facility], [place] [address], [place], [place] [address]  Protime-INR     Status: None   Collection Time: 03/08/18  9:41 PM  Result Value Ref Range   Prothrombin Time 15.1 11.4 - 15.2 seconds   INR 1.20     Comment: Performed at [facility], [place] [address], [place], [place] [address]   Dg Tibia/fibula Right  Result Date: 03/08/2018 CLINICAL DATA:  Ankle fracture EXAM: RIGHT TIBIA AND FIBULA - 2 VIEW COMPARISON:  None. FINDINGS: Acute mildly displaced distal fibular fracture. Diffuse soft tissue swelling. Probable cortical avulsion off the distal anterior aspect of the tibia. IMPRESSION: 1. Acute mildly displaced distal fibular fracture 2. Probable acute mildly displaced fracture off the anterior distal cortical surface of the tibia Electronically Signed   By: Donavan Foil M.D.   On: 03/08/2018 19:09   Dg Ankle Complete Right  Result Date: 03/08/2018 CLINICAL DATA:  Injury EXAM: RIGHT ANKLE - COMPLETE 3+ VIEW COMPARISON:  None. FINDINGS: Acute  fracture involving the distal metaphysis of the fibula with less than 1/4 bone with lateral displacement of distal fracture fragment. Mortise is symmetric. Suspected small  avulsion fracture injury involving the distal anterior inferior aspect of the tibia. Small plantar calcaneal spur. Diffuse soft tissue swelling. [place] or old injury adjacent to the tip of the fibular malleolus. IMPRESSION: 1. Acute mildly displaced distal fibular fracture 2. Probable acute cortical avulsion injury off the distal anterior tibia. Electronically Signed   By: Donavan Foil M.D.   On: 03/08/2018 19:08   None  Pending Labs Unresulted Labs (From admission, onward)    Start     Ordered   03/09/18 0500  CBC  Tomorrow morning,   R     03/08/18 2357   03/09/18 9233  Basic metabolic panel  Tomorrow morning,   R     03/08/18 2357   03/08/18 2355  Ammonia  Once,   R     03/08/18 2354   03/08/18 2354  APTT  Once,   R     03/08/18 2354   03/08/18 2354  Type and screen [place]  Once,   R    Comments:  [place]    03/08/18 2354   03/08/18 2354  Brain natriuretic peptide  Once,   R     03/08/18 2354          Vitals/Pain Today's Vitals   03/08/18 1934 03/08/18 2100 03/08/18 2309 03/09/18 0000  BP:  (!) 122/58 112/60 122/62  Pulse:  61 60 (!) 57  Resp:   20 20  Temp:      TempSrc:      SpO2:  90% 97% 98%  Weight:      Height:      PainSc: 7        Isolation Precautions No active isolations  Medications Medications  HYDROcodone-acetaminophen (NORCO/VICODIN) 5-325 MG per tablet 1 tablet (has no administration in time range)  hydrochlorothiazide (MICROZIDE) capsule 12.5 mg (has no administration in time range)  propranolol (INDERAL) tablet 20 mg (has no administration in time range)  insulin degludec (TRESIBA) 100 UNIT/ML FlexTouch Pen 9 Units (has no administration in time range)  pantoprazole (PROTONIX) EC tablet 40 mg (has no administration in time range)   gabapentin (NEURONTIN) capsule 600 mg (has no administration in time range)  rotigotine (NEUPRO) 4 MG/24HR 1 patch (has no administration in time range)  cholecalciferol (VITAMIN D3) tablet 1,000 Units (has no administration in time range)  albuterol (PROVENTIL) (2.5 MG/3ML) 0.083% nebulizer solution 2.5 mg (has no administration in time range)  dextromethorphan-guaiFENesin (MUCINEX DM) 30-600 MG per 12 hr tablet 1 tablet (has no administration in time range)  morphine 2 MG/ML injection 1 mg (has no administration in time range)  methocarbamol (ROBAXIN) tablet 500 mg (has no administration in time range)  insulin aspart (novoLOG) injection 0-9 Units (has no administration in time range)  senna-docusate (Senokot-S) tablet 1 tablet (has no administration in time range)  ondansetron (ZOFRAN) injection 4 mg (has no administration in time range)  hydrALAZINE (APRESOLINE) injection 5 mg (has no administration in time range)  zolpidem (AMBIEN) tablet 5 mg (has no administration in time range)  HYDROcodone-acetaminophen (NORCO/VICODIN) 5-325 MG per tablet 2 tablet (2 tablets Oral Given 03/08/18 1904)  ondansetron (ZOFRAN-ODT) disintegrating tablet 8 mg (8 mg Oral Given 03/08/18 1910)  HYDROcodone-acetaminophen (NORCO/VICODIN) 5-325 MG per tablet 2 tablet (2 tablets Oral Given 03/08/18 2234)    Mobility non-ambulatory currently due to injury

## 2018-03-09 NOTE — Progress Notes (Signed)
PROGRESS NOTE    Olivia Benson  FTD:[id number] DOB: 03-17-1945 DOA: 03/08/2018 PCP: Olivia Rima, MD  Outpatient Specialists:   Brief Narrative:  Olivia Benson is a 73 year old female, with past medical history significant for hypertension, diabetes mellitus, asthma, [name], liver cirrhosis, portal hypertension, GIB, CAD, non-STEMI, disseminated ovarian cancer with peritoneal carcinomatosis (stopped chemotherapy 4 weeks ago), RLS, thrombocytopenia, obesity, who presents with fall, right ankle pain.  Pt states that she ha on slippery bedroom shoes and slipped on garage stairs at about 1:00 PM on the day of admission, injured her right ankle, causing severe pain in right ankle. The pain was constant, sharp, 10 out of 10 severity, nonradiating.  On presentation to the hospital, patient was found to have pancytopenia (WBC 3.5, hemoglobin 11.1, platelet 67), renal function and electrolytes were okay, and X-ray showed right displaced distal fibula fracture.  Patient underwent open treatment of the right ankle fracture with internal fixation.   Assessment & Plan:   Principal Problem:   Displaced fracture of distal end of right fibula Active Problems:   Essential hypertension   Ascites   Disseminated ovarian cancer (HCC)   Liver cirrhosis secondary to NASH (HCC)   Restless legs syndrome   Pancytopenia, acquired ([place])   Asthma   Primary peritoneal carcinomatosis ([place])   Diabetes mellitus without complication (HCC)   Thrombocytopenia ([place])   GIB (gastrointestinal bleeding)   Fall   Chronic diastolic CHF (congestive heart failure) ([place])  Displaced fracture of distal end of right fibula:  As evidenced by x-ray. Patient has moderate pain now. No neurovascular compromise. Orthopedic surgeon, Dr. Erlinda Hong was consulted-->plans to do surgery tomorrow. - will admit to Med-surg bed - Pain control: morphine prn and Norco - When necessary Zofran for nausea - Robaxin for muscle spasm - Appreciated  Dr. Erlinda Hong consultation - type and cross - INR/PTT - PT/OT when able to (not ordered now) 03/09/2018: Patient underwent open treatment with internal fixation of the right ankle fracture.  Fall: Patient seems to have had a mechanical fall.  She strongly denies any head or neck injury. Does not want to do CT of head and neck.  -pt/ot when able to  RLS: -Rotigotine  HTN:  -Continue home medications: HCTZ -IV hydralazine prn 03/09/2018: Continue to monitor and optimize.  Liver cirrhosis secondary to NASH, ascites and esophageal varices: Patient's mental status normal.  No active bleeding. INR 1.20.  Mental status normal. -will check PTT -will check ammonia level -pt is scheduled for esophageal banding procedure by Dr. 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 Addison Bailey. She was on chemotherapy before she had a GI bleeding 4 weeks ago. -Follow-up with Dr. Addison Bailey  Pancytopenia, acquired and Thrombocytopenia: Hemoglobin stable 11.1.  Platelet is 67.  No active bleeding. -Follow-up by CBC  Asthma: stable -prn albuterol   Diabetes mellitus without complication ([place]): Last A1c 6.3 on 01/28/14, well controled. Patient is taking degludec at home -will decrease degludec insulin dose from 14 to 9 units daily -SSI  Hx of GIB (gastrointestinal bleeding): No active rectal bleeding -Increase the Protonix dose 40 mg daily to twice daily    DVT ppx: SCD Code Status: Full code Family Communication:  Disposition Plan:  Will depend on hospital course.  Consults called:  Ortho, Dr. Erlinda Hong  Procedures:   Open right ankle fracture with internal fixation.    Antimicrobials:  None   Subjective: Pain is controlled. No new complaints.  Objective: Vitals:   03/09/18 1547 03/09/18 1559 03/09/18 1639 03/09/18 1956  BP: (!) 161/72  (!) 157/62 139/60    Pulse: (!) 55  60 68  Resp: 12  18 18   Temp:  97.9 F (36.6 C) 97.9 F (36.6 C) 98.4 F (36.9 C)  TempSrc:   Oral Oral  SpO2: 100%  96% 93%  Weight:      Height:        Intake/Output Summary (Last 24 hours) at 03/09/2018 2117 Last data filed at 03/09/2018 1600 Gross per 24 hour  Intake 1300 ml  Output 100 ml  Net 1200 ml   Filed Weights   03/08/18 1723  Weight: 82.5 kg    Examination:  General exam: Appears calm and comfortable  Respiratory system: Clear to auscultation. Respiratory effort normal. Cardiovascular system: S1 & S2 heard. Gastrointestinal system: Abdomen is nondistended, soft and nontender. No organomegaly or masses felt. Normal bowel sounds heard. Central nervous system: Alert and oriented. No focal neurological deficits.  Data Reviewed: I have personally reviewed following labs and imaging studies  CBC: Recent Labs  Lab 03/08/18 2141 03/09/18 1648  WBC 3.5* 2.8*  NEUTROABS 2.0  --   HGB 11.1* 12.4  HCT 36.0 40.6  MCV 101.4* 99.3  PLT 67* 62*   Basic Metabolic Panel: Recent Labs  Lab 03/08/18 2141 03/09/18 1648  NA 141  --   K 3.7  --   CL 109  --   CO2 24  --   GLUCOSE 113*  --   BUN 22  --   CREATININE 0.84 1.01*  CALCIUM 8.8*  --    GFR: Estimated Creatinine Clearance: 51.6 mL/min (A) (by C-G formula based on SCr of 1.01 mg/dL (H)). Liver Function Tests: No results for input(s): AST, ALT, ALKPHOS, BILITOT, PROT, ALBUMIN in the last 168 hours. No results for input(s): LIPASE, AMYLASE in the last 168 hours. No results for input(s): AMMONIA in the last 168 hours. Coagulation Profile: Recent Labs  Lab 03/08/18 2141  INR 1.20   Cardiac Enzymes: No results for input(s): CKTOTAL, CKMB, CKMBINDEX, TROPONINI in the last 168 hours. BNP (last 3 results) No results for input(s): PROBNP in the last 8760 hours. HbA1C: No results for input(s): HGBA1C in the last 72 hours. CBG: Recent Labs  Lab 03/09/18 0827 03/09/18 1151  03/09/18 1502 03/09/18 1632  GLUCAP 113* 96 73 88   Lipid Profile: No results for input(s): CHOL, HDL, LDLCALC, TRIG, CHOLHDL, LDLDIRECT in the last 72 hours. Thyroid Function Tests: No results for input(s): TSH, T4TOTAL, FREET4, T3FREE, THYROIDAB in the last 72 hours. Anemia Panel: No results for input(s): VITAMINB12, FOLATE, FERRITIN, TIBC, IRON, RETICCTPCT in the last 72 hours. Urine analysis:    Component Value Date/Time   COLORURINE YELLOW 10/22/2017 [place] 10/22/2017 1041   LABSPEC 1.015 10/22/2017 1041   PHURINE 5.0 10/22/2017 1041   GLUCOSEU NEGATIVE 10/22/2017 1041   HGBUR MODERATE (A) 10/22/2017 1041   BILIRUBINUR NEGATIVE 10/22/2017 1041   KETONESUR NEGATIVE 10/22/2017 1041   PROTEINUR NEGATIVE 02/13/2018 0749   UROBILINOGEN 0.2 03/22/2014 0917   NITRITE POSITIVE (A) 10/22/2017 1041   LEUKOCYTESUR SMALL (A) 10/22/2017 1041   Sepsis Labs: @LABRCNTIP (procalcitonin:4,lacticidven:4)  ) Recent Results (from the past 240 hour(s))  Surgical pcr screen     Status: Abnormal   Collection Time: 03/09/18  2:12 AM  Result Value Ref Range Status   MRSA, PCR NEGATIVE  NEGATIVE Final   Staphylococcus aureus POSITIVE (A) NEGATIVE Final    Comment: (NOTE) The Xpert SA Assay (FDA approved for NASAL specimens in patients 70 years of age and older), is one component of a comprehensive surveillance program. It is not intended to diagnose infection nor to guide or monitor treatment.          Radiology Studies: Dg Tibia/fibula Right  Result Date: 03/08/2018 CLINICAL DATA:  Ankle fracture EXAM: RIGHT TIBIA AND FIBULA - 2 VIEW COMPARISON:  None. FINDINGS: Acute mildly displaced distal fibular fracture. Diffuse soft tissue swelling. Probable cortical avulsion off the distal anterior aspect of the tibia. IMPRESSION: 1. Acute mildly displaced distal fibular fracture 2. Probable acute mildly displaced fracture off the anterior distal cortical surface of the tibia  Electronically Signed   By: Donavan Foil M.D.   On: 03/08/2018 19:09   Dg Ankle Complete Right  Result Date: 03/09/2018 CLINICAL DATA:  ORIF RIGHT lateral malleolar fracture EXAM: Three views RIGHT ankle. COMPARISON:  None. FINDINGS: Internal plate fixation of the lateral malleolar fracture. Normal alignment. Ankle mortise intact. IMPRESSION: Internal fixation of lateral malleolar fracture without complication Electronically Signed   By: Suzy Bouchard M.D.   On: 03/09/2018 15:56   Dg Ankle Complete Right  Result Date: 03/08/2018 CLINICAL DATA:  Injury EXAM: RIGHT ANKLE - COMPLETE 3+ VIEW COMPARISON:  None. FINDINGS: Acute fracture involving the distal metaphysis of the fibula with less than 1/4 bone with lateral displacement of distal fracture fragment. Mortise is symmetric. Suspected small avulsion fracture injury involving the distal anterior inferior aspect of the tibia. Small plantar calcaneal spur. Diffuse soft tissue swelling. [place] or old injury adjacent to the tip of the fibular malleolus. IMPRESSION: 1. Acute mildly displaced distal fibular fracture 2. Probable acute cortical avulsion injury off the distal anterior tibia. Electronically Signed   By: Donavan Foil M.D.   On: 03/08/2018 19:08   Dg C-arm 1-60 Min  Result Date: 03/09/2018 : CLINICAL DATA: ORIF RIGHT lateral malleolar fracture EXAM: Three views RIGHT ankle. COMPARISON: None. FINDINGS: Internal plate fixation of the lateral malleolar fracture. Normal alignment. Ankle mortise intact. IMPRESSION: Internal fixation of lateral malleolar fracture without complication Electronically Signed   By: Suzy Bouchard M.D.   On: 03/09/2018 16:16        Scheduled Meds: . acetaminophen  1,000 mg Oral Q6H  . Chlorhexidine Gluconate Cloth  6 each Topical Daily  . cholecalciferol  1,000 Units Oral QHS  . docusate sodium  100 mg Oral BID  . [START ON 03/10/2018] enoxaparin (LOVENOX) injection  40 mg Subcutaneous Q24H  . gabapentin   600 mg Oral BID  . hydrochlorothiazide  12.5 mg Oral QPC breakfast  . insulin aspart  0-9 Units Subcutaneous TID WC  . insulin glargine  9 Units Subcutaneous Daily  . mupirocin ointment  1 application Nasal BID  . oxyCODONE  10 mg Oral Q12H  . pantoprazole  40 mg Oral BID  . propranolol  20 mg Oral BID  . rotigotine  1 patch Transdermal Daily   Continuous Infusions: . sodium chloride 125 mL/hr at 03/09/18 1732  .  ceFAZolin (ANCEF) IV    . methocarbamol (ROBAXIN) IV       LOS: 1 day    Time spent: 25 minutes.    Dana Allan, MD  [facility] Pager #: [phone] 7PM-7AM contact night coverage as above

## 2018-03-09 NOTE — Progress Notes (Signed)
Orthopedic Tech Progress Note Patient Details:  Olivia Benson 09-16-1945 [id number]  Patient ID: [name], female   DOB: [birth year], 73 y.o.   MRN: [id number] [facility]  Karolee Stamps 03/09/2018, 8:44 PM

## 2018-03-09 NOTE — Anesthesia Postprocedure Evaluation (Signed)
Anesthesia Post Note  Patient: Olivia Benson  Procedure(s) Performed: OPEN REDUCTION INTERNAL FIXATION (ORIF) RIGHT LATERAL MALLEOLUS (Right Ankle)     Patient location during evaluation: PACU Anesthesia Type: General Level of consciousness: awake and alert Pain management: pain level controlled Vital Signs Assessment: post-procedure vital signs reviewed and stable Respiratory status: spontaneous breathing, nonlabored ventilation and respiratory function stable Cardiovascular status: blood pressure returned to baseline and stable Postop Assessment: no apparent nausea or vomiting Anesthetic complications: no    Last Vitals:  Vitals:   03/09/18 1547 03/09/18 1559  BP: (!) 161/72   Pulse: (!) 55   Resp: 12   Temp:  36.6 C  SpO2: 100%     Last Pain:  Vitals:   03/09/18 1559  TempSrc:   PainSc: 0-No pain                 Audry Pili

## 2018-03-09 NOTE — Discharge Instructions (Signed)
° ° °  1. Keep splint clean and dry 2. Elevate foot above level of the heart 3. Take lovenox to prevent blood clots 4. Take pain meds as needed 5. Strict non weight bearing to operative extremity

## 2018-03-09 NOTE — Transfer of Care (Signed)
Immediate Anesthesia Transfer of Care Note  Patient: Olivia Benson  Procedure(s) Performed: OPEN REDUCTION INTERNAL FIXATION (ORIF) RIGHT LATERAL MALLEOLUS (Right Ankle)  Patient Location: PACU  Anesthesia Type:General  Level of Consciousness: drowsy and patient cooperative  Airway & Oxygen Therapy: Patient Spontanous Breathing and Patient connected to face mask oxygen  Post-op Assessment: Report given to RN and Post -op Vital signs reviewed and stable  Post vital signs: Reviewed and stable  Last Vitals:  Vitals Value Taken Time  BP 134/59 03/09/2018  3:00 PM  Temp    Pulse 67 03/09/2018  3:02 PM  Resp 14 03/09/2018  3:02 PM  SpO2 100 % 03/09/2018  3:02 PM  Vitals shown include unvalidated device data.  Last Pain:  Vitals:   03/09/18 1210  TempSrc: Oral  PainSc:          Complications: No apparent anesthesia complications

## 2018-03-09 NOTE — Anesthesia Preprocedure Evaluation (Addendum)
Anesthesia Evaluation  Patient identified by MRN, date of birth, ID band Patient awake    Reviewed: Allergy & Precautions, NPO status , Patient's Chart, lab work & pertinent test results  History of Anesthesia Complications (+) PONV, Family history of anesthesia reaction and history of anesthetic complications  Airway Mallampati: III  TM Distance: >3 FB Neck ROM: Full    Dental  (+) Dental Advisory Given   Pulmonary asthma , former smoker,    breath sounds clear to auscultation       Cardiovascular hypertension, Pt. on medications and Pt. on home beta blockers (-) angina+ Past MI and +CHF   Rhythm:Regular Rate:Normal   '19 TTE - EF 55% to 60%. Grade 2 diastolic dysfunction. Mild MR. Mildly dilated LA.    Neuro/Psych  Neuromuscular disease (RLS, peripheral neuropathy) negative psych ROS   GI/Hepatic GERD  Medicated and Controlled,(+) Cirrhosis       , Hepatitis -  Endo/Other  diabetes, Insulin Dependent Obesity   Renal/GU negative Renal ROS Bladder dysfunction      Musculoskeletal negative musculoskeletal ROS (+)   Abdominal   Peds  Hematology  Thrombocytopenia    Anesthesia Other Findings Right Ankle XR - IMPRESSION: 1. Acute mildly displaced distal fibular fracture 2. Probable acute cortical avulsion injury off the distal anterior tibia.  Reproductive/Obstetrics  Ovarian cancer with peritoneal carcinomatosis                             Anesthesia Physical Anesthesia Plan  ASA: III  Anesthesia Plan: General   Post-op Pain Management:  Regional for Post-op pain   Induction: Intravenous  PONV Risk Score and Plan: 4 or greater and Treatment may vary due to age or medical condition, Ondansetron, Dexamethasone and Propofol infusion  Airway Management Planned: LMA  Additional Equipment: None  Intra-op Plan:   Post-operative Plan: Extubation in OR  Informed Consent:    Plan Discussed with: CRNA and Anesthesiologist  Anesthesia Plan Comments:        Anesthesia Quick Evaluation

## 2018-03-09 NOTE — ED Notes (Signed)
[facility] called for pt

## 2018-03-09 NOTE — Progress Notes (Signed)
RN verified the presence of a signed informed consent that matches stated procedure by patient. Verified armband matches patient's stated name and birth date. Verified NPO status and that all jewelry, contact, glasses, dentures, and partials had been removed (if applicable).  

## 2018-03-09 NOTE — Progress Notes (Signed)
Notified on call for [facility] that patient's PLT count was [id number]

## 2018-03-09 NOTE — Consult Note (Signed)
ORTHOPAEDIC CONSULTATION  REQUESTING PHYSICIAN: Bonnell Public, MD  Chief Complaint: right ankle fracture  HPI: Olivia Benson is a 73 y.o. female who presents with right ankle fracture s/p mechanical fall at home slipping on wet floor.  Denies any LOC, neck pain, abd pain.  She has well controlled diabetes.  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  She endorses right ankle pain.    Past Medical History:  Diagnosis Date  . Asthma   . Cirrhosis ([place])   . Complication of anesthesia    slow to wake up / n & v  . Diabetes mellitus   . Elevated LFTs   . Family history of adverse reaction to anesthesia    multiple family members have had problems such as slow to wake / "flat lined" /  ventilator    . Frequency of urination   . Glaucoma, narrow-angle   . History of skin cancer   . Hypertension   . Non-ST elevated myocardial infarction (non-STEMI) [facility]) Sept 2012   Mild elevation in troponin and CK MB but no evidence of coronary disease at time of cath  . Obesity   . Ovarian cancer ([place]) 11/22/2013   Disseminated ovarian cancer  . Overactive bladder   . PONV (postoperative nausea and vomiting)   . Portal hypertension ([place])   . Restless leg syndrome   . Tingling    hands and feet   Past Surgical History:  Procedure Laterality Date  . ABDOMINAL ULTRASOUND  Sept 2012   No gallbladder disease, + fatty liver  . CARDIAC CATHETERIZATION  10/25/2010   EF 60%; Normal coronaries  . COLONOSCOPY  10/18/2005  . COSMETIC SURGERY  2003  . DENTAL SURGERY    . ESOPHAGEAL BANDING  02/10/2018   Procedure: ESOPHAGEAL BANDING;  Surgeon: Ronnette Juniper, MD;  Location: [facility];  Service: Gastroenterology;;  . ESOPHAGOGASTRODUODENOSCOPY (EGD) WITH PROPOFOL N/A 02/10/2018   Procedure: ESOPHAGOGASTRODUODENOSCOPY (EGD) WITH PROPOFOL;  Surgeon: Ronnette Juniper, MD;  Location: [facility];  Service: Gastroenterology;  Laterality: N/A;  . EYE SURGERY Bilateral  09/19/14 R eye, 08/2014 L eye   open angles in both eyes d/t narrow angle glaucoma  . LAPAROTOMY N/A 03/29/2014   Procedure: EXPLORATORY LAPAROTOMY;  Surgeon: Everitt Amber, MD;  Location: [facility];  Service: Gynecology;  Laterality: N/A;  . PARTIAL HYSTERECTOMY  1988  . SALPINGOOPHORECTOMY Bilateral 03/29/2014   Procedure: BILATERAL SALPINGO OOPHORECTOMY, OMENTECTOMY, DEBULKING;  Surgeon: Everitt Amber, MD;  Location: [facility];  Service: Gynecology;  Laterality: Bilateral;  . TONSILLECTOMY  1971  . TUBAL LIGATION    . [facility]  11/24/2009   Social History   Socioeconomic History  . Marital status: Married    Spouse name: Not on file  . Number of children: 1 D  . Years of education: Not on file  . Highest education level: Not on file  Occupational History  . Occupation: Retired  Scientific laboratory technician  . Financial resource strain: Not on file  . Food insecurity:    Worry: Not on file    Inability: Not on file  . Transportation needs:    Medical: Not on file    Non-medical: Not on file  Tobacco Use  . Smoking status: Former Smoker    Packs/day: 1.00    Years: 1.00    Pack years: 1.00    Types: Cigarettes    Last attempt to quit: 02/11/1982    Years since quitting: 36.0  . Smokeless tobacco: Never Used  Substance and Sexual Activity  . Alcohol use: No    Alcohol/week: 0.0 standard drinks  . Drug use: No  . Sexual activity: Not on file  Lifestyle  . Physical activity:    Days per week: Not on file    Minutes per session: Not on file  . Stress: Not on file  Relationships  . Social connections:    Talks on phone: Not on file    Gets together: Not on file    Attends religious service: Not on file    Active member of club or organization: Not on file    Attends meetings of clubs or organizations: Not on file    Relationship status: Not on file  Other Topics Concern  . Not on file  Social History Narrative  . Not on file   Family History  Problem Relation Age of Onset  . Asthma  Mother   . Diabetes Mother   . Hypertension Mother   . Allergies Mother   . Rheum arthritis Mother   . Bladder Cancer Father 45       former heavy smoker  . Hypertension Father   . Stroke Father   . Breast cancer Sister 75  . Diabetes Sister        x 3  . Allergies Sister   . Asthma Sister        x 2   . Breast cancer Maternal Aunt 85  . Breast cancer Cousin 57       (maternal) bilateral cancer  . Aneurysm Son 90  . Diabetes Brother   . Allergies Brother   . Asthma Brother    - negative except otherwise stated in the family history section Allergies  Allergen Reactions  . Shellfish Allergy Anaphylaxis    HER THROAT SWELLS UP AND CLOSES  . Benadryl [Diphenhydramine Hcl] Other (See Comments)    Restless legs   . Penicillins Rash    DID THE REACTION INVOLVE: Swelling of the face/tongue/throat, SOB, or low BP? No Sudden or severe rash/hives, skin peeling, or the inside of the mouth or nose? No Did it require medical treatment? No When did it last happen?Childhood allergy If all above answers are "NO", may proceed with cephalosporin use.   . Tape Itching    Red itch with some tapes   Prior to Admission medications   Medication Sig Start Date End Date Taking? Authorizing Provider  cholecalciferol (VITAMIN D) 1000 units tablet Take 1,000 Units by mouth at bedtime.    Yes [provider]  gabapentin (NEURONTIN) 300 MG capsule Take 2 capsules (600 mg total) by mouth 2 (two) times daily. 06/30/17  Yes [name], MD  hydrochlorothiazide (MICROZIDE) 12.5 MG capsule Take 12.5 mg by mouth daily after breakfast.  05/21/17  Yes [provider]  HYDROcodone-acetaminophen (NORCO/VICODIN) 5-325 MG tablet take 1 tablet by mouth every 6 hours if needed for MODERATE PAIN Patient taking differently: Take 1 tablet by mouth every 6 (six) hours as needed for moderate pain or severe pain.  02/27/18  Yes Gorsuch, Ni, MD  insulin degludec (TRESIBA FLEXTOUCH) 100 UNIT/ML SOPN  FlexTouch Pen Inject 18 Units into the skin daily after breakfast.    Yes [provider]  lidocaine-prilocaine (EMLA) cream Apply to Porta-cath  1-2 hours prior to access as directed. 11/13/17  Yes Gorsuch, Ni, MD  ondansetron (ZOFRAN) 8 MG tablet Take 1 tablet (8 mg total) by mouth every 8 (eight) hours as needed for nausea. 10/24/16  Yes Alvy Bimler,  Ni, MD  pantoprazole (PROTONIX) 40 MG tablet Take 40 mg by mouth daily after breakfast.  04/26/15  Yes [provider]  propranolol (INDERAL) 20 MG tablet Take 1 tablet (20 mg total) by mouth 2 (two) times daily. 02/12/18  Yes Regalado, Belkys A, MD  rotigotine (NEUPRO) 4 MG/24HR Place 1 patch onto the skin daily. 12/31/17  Yes Tat, Eustace Quail, DO   Dg Tibia/fibula Right  Result Date: 03/08/2018 CLINICAL DATA:  Ankle fracture EXAM: RIGHT TIBIA AND FIBULA - 2 VIEW COMPARISON:  None. FINDINGS: Acute mildly displaced distal fibular fracture. Diffuse soft tissue swelling. Probable cortical avulsion off the distal anterior aspect of the tibia. IMPRESSION: 1. Acute mildly displaced distal fibular fracture 2. Probable acute mildly displaced fracture off the anterior distal cortical surface of the tibia Electronically Signed   By: Donavan Foil M.D.   On: 03/08/2018 19:09   Dg Ankle Complete Right  Result Date: 03/08/2018 CLINICAL DATA:  Injury EXAM: RIGHT ANKLE - COMPLETE 3+ VIEW COMPARISON:  None. FINDINGS: Acute fracture involving the distal metaphysis of the fibula with less than 1/4 bone with lateral displacement of distal fracture fragment. Mortise is symmetric. Suspected small avulsion fracture injury involving the distal anterior inferior aspect of the tibia. Small plantar calcaneal spur. Diffuse soft tissue swelling. [place] or old injury adjacent to the tip of the fibular malleolus. IMPRESSION: 1. Acute mildly displaced distal fibular fracture 2. Probable acute cortical avulsion injury off the distal anterior tibia. Electronically Signed   By:  Donavan Foil M.D.   On: 03/08/2018 19:08   - pertinent xrays, CT, MRI studies were reviewed and independently interpreted  Positive ROS: All other systems have been reviewed and were otherwise negative with the exception of those mentioned in the HPI and as above.  Physical Exam: General: Alert, no acute distress Cardiovascular: No pedal edema Respiratory: No cyanosis, no use of accessory musculature GI: No organomegaly, abdomen is soft and non-tender Skin: No lesions in the area of chief complaint Neurologic: Sensation intact distally Psychiatric: Patient is competent for consent with normal mood and affect Lymphatic: No axillary or cervical lymphadenopathy  MUSCULOSKELETAL:  - well fitting splint - toes wwp  Assessment: Right lateral malleolus fracture  Plan: - reviewed xrays with patient, she is very active and normally walks without assistive devices - recommend ORIF to stabilize fracture and to allow for early mobilization and ROM of ankle - r/b/a discussed with patient, she elects to proceed - surgery planned for this afternoon  Thank you for the consult and the opportunity to see Ms. Adron Bene. Eduard Roux, MD [place] 7:15 AM

## 2018-03-09 NOTE — Progress Notes (Signed)
Orthopedic Tech Progress Note Patient Details:  BORGHILD THAKER 07-16-1945 [id number] OR RN called down and asked me to drop off a CAM WALKER at the desk Ortho Devices Type of Ortho Device: CAM walker Ortho Device/Splint Location: right Ortho Device/Splint Interventions: Application   Post Interventions Patient Tolerated: Other (comment) Instructions Provided: Other (comment)   Janit Pagan 03/09/2018, 2:50 PM

## 2018-03-10 ENCOUNTER — Encounter (HOSPITAL_COMMUNITY): Payer: Self-pay | Admitting: Orthopaedic Surgery

## 2018-03-10 LAB — CBC WITH DIFFERENTIAL/PLATELET
Abs Immature Granulocytes: 0.01 10*3/uL (ref 0.00–0.07)
Basophils Absolute: 0 10*3/uL (ref 0.0–0.1)
Basophils Relative: 0 %
Eosinophils Absolute: 0 10*3/uL (ref 0.0–0.5)
Eosinophils Relative: 0 %
HCT: 37 % (ref 36.0–46.0)
Hemoglobin: 11.5 g/dL — ABNORMAL LOW (ref 12.0–15.0)
Immature Granulocytes: 0 %
Lymphocytes Relative: 19 %
Lymphs Abs: 0.6 10*3/uL — ABNORMAL LOW (ref 0.7–4.0)
MCH: 30.2 pg (ref 26.0–34.0)
MCHC: 31.1 g/dL (ref 30.0–36.0)
MCV: 97.1 fL (ref 80.0–100.0)
Monocytes Absolute: 0.1 10*3/uL (ref 0.1–1.0)
Monocytes Relative: 4 %
Neutro Abs: 2.3 10*3/uL (ref 1.7–7.7)
Neutrophils Relative %: 77 %
Platelets: 62 10*3/uL — ABNORMAL LOW (ref 150–400)
RBC: 3.81 MIL/uL — ABNORMAL LOW (ref 3.87–5.11)
RDW: 18 % — ABNORMAL HIGH (ref 11.5–15.5)
WBC: 3 10*3/uL — ABNORMAL LOW (ref 4.0–10.5)
nRBC: 0 % (ref 0.0–0.2)

## 2018-03-10 LAB — RENAL FUNCTION PANEL
Albumin: 3.2 g/dL — ABNORMAL LOW (ref 3.5–5.0)
Anion gap: 10 (ref 5–15)
BUN: 14 mg/dL (ref 8–23)
CO2: 22 mmol/L (ref 22–32)
Calcium: 8.8 mg/dL — ABNORMAL LOW (ref 8.9–10.3)
Chloride: 109 mmol/L (ref 98–111)
Creatinine, Ser: 1 mg/dL (ref 0.44–1.00)
GFR calc Af Amer: >60 mL/min (ref >60)
GFR calc non Af Amer: 57 mL/min — ABNORMAL LOW (ref >60)
Glucose, Bld: 225 mg/dL — ABNORMAL HIGH (ref 70–99)
Phosphorus: 3.6 mg/dL (ref 2.5–4.6)
Potassium: 4.1 mmol/L (ref 3.5–5.1)
Sodium: 141 mmol/L (ref 135–145)

## 2018-03-10 LAB — GLUCOSE, CAPILLARY
Glucose-Capillary: 171 mg/dL — ABNORMAL HIGH (ref 70–99)
Glucose-Capillary: 156 mg/dL — ABNORMAL HIGH (ref 70–99)
Glucose-Capillary: 140 mg/dL — ABNORMAL HIGH (ref 70–99)
Glucose-Capillary: 160 mg/dL — ABNORMAL HIGH (ref 70–99)

## 2018-03-10 LAB — MAGNESIUM: Magnesium: 1.1 mg/dL — ABNORMAL LOW (ref 1.7–2.4)

## 2018-03-10 MED ORDER — SODIUM CHLORIDE 0.9% FLUSH: 10.0000 mL | INTRAVENOUS | Status: DC | PRN

## 2018-03-10 NOTE — Progress Notes (Signed)
Patient is stable for dc home from ortho stand point. F/u 2 weeks in office.  Azucena Cecil, MD [name] 8:51 PM

## 2018-03-10 NOTE — Evaluation (Signed)
Physical Therapy Evaluation Patient Details Name: Olivia Benson MRN: [id number] DOB: 1945/10/26 Today's Date: 03/10/2018   History of Present Illness  73 year old female, with past medical history significant for hypertension, diabetes mellitus, asthma, [name], liver cirrhosis, portal hypertension, GIB, CAD, non-STEMI, disseminated ovarian cancer with peritoneal carcinomatosis (stopped chemotherapy 4 weeks ago), RLS, thrombocytopenia, obesity, who presents with fall, right ankle pain. S/p R ankle ORIF 03/09/18. NWB   Clinical Impression  PTA pt living with husband in single story home with 6 steps to enter and independent in mobility and ADLs. Pt currently limited in safe mobility by NWB through R LE, decreased strength and endurance and R ankle pain. Pt requires supervision for bed mobility, and minA for transfers and ambulation of 2 feet with RW. As long as pt can arrange 24 hour care at discharge PT recommends HHPT at discharge to improve UE strength and to improve pt mobility in her home environment. PT will work on stair training in next session and if pt is not safe with ascent/descent of steps she may require transfer home.     Follow Up Recommendations Supervision/Assistance - 24 hour;Home health PT    Equipment Recommendations  Rolling walker with 5" wheels;3in1 (PT)    Recommendations for Other Services OT consult     Precautions / Restrictions Precautions Precautions: Fall Precaution Comments: hx of falling Required Braces or Orthoses: Other Brace(CAM Boot) Other Brace: CAM Boot Restrictions Weight Bearing Restrictions: Yes RLE Weight Bearing: Non weight bearing      Mobility  Bed Mobility Overal bed mobility: Needs Assistance Bed Mobility: Supine to Sit     Supine to sit: Supervision;HOB elevated     General bed mobility comments: supervision for safety, increased time and effort and use of bedrail to pull to EoB  Transfers Overall transfer level: Needs  assistance Equipment used: Rolling walker (2 wheeled) Transfers: Sit to/from [facility] Sit to Stand: Min assist Stand pivot transfers: Min assist       General transfer comment: minA for power up and steadying with sit>stand, minA for steadying for pivoting from bed to BSC, vc for elevating on L ball of foot to pivot toward BSC  Ambulation/Gait Ambulation/Gait assistance: Min assist Gait Distance (Feet): 2 Feet Assistive device: Rolling walker (2 wheeled) Gait Pattern/deviations: Decreased step length - left;Trunk flexed(hop to pattern) Gait velocity: slowed Gait velocity interpretation: <1.31 ft/sec, indicative of household ambulator General Gait Details: minA for steadying with hopping from [facility] to recliner, vc for increased UE support to move L LE      Balance Overall balance assessment: Needs assistance Sitting-balance support: No upper extremity supported;Feet supported Sitting balance-Leahy Scale: Fair     Standing balance support: Single extremity supported;During functional activity Standing balance-Leahy Scale: Poor Standing balance comment: requires at least single UE support to maintain balance while pulling pants up                             Pertinent Vitals/Pain Pain Assessment: 0-10 Pain Score: 2  Pain Location: R ankle Pain Descriptors / Indicators: Numbness;Tingling Pain Intervention(s): Limited activity within patient's tolerance;Monitored during session;Repositioned    Home Living Family/patient expects to be discharged to:: Private residence Living Arrangements: Spouse/significant other Available Help at Discharge: Family Type of Home: House Home Access: Stairs to enter Entrance Stairs-Rails: Psychiatric nurse of Steps: 6 Home Layout: One level Home Equipment: Grab bars - tub/shower;Crutches      Prior Function Level  of Independence: Independent                  Extremity/Trunk Assessment    Upper Extremity Assessment Upper Extremity Assessment: Generalized weakness    Lower Extremity Assessment Lower Extremity Assessment: RLE deficits/detail;LLE deficits/detail RLE Deficits / Details: R knee and hip AROM limited by weight of CAM Walker, strength grossly assessed 3+/5, sensation has not returned after spinal block RLE: Unable to fully assess due to immobilization RLE Sensation: decreased light touch    Cervical / Trunk Assessment Cervical / Trunk Assessment: Normal  Communication   Communication: No difficulties  Cognition Arousal/Alertness: Awake/alert Behavior During Therapy: WFL for tasks assessed/performed Overall Cognitive Status: Within Functional Limits for tasks assessed                                        General Comments General comments (skin integrity, edema, etc.): CAM Walker in place, did not visualize dressing        Assessment/Plan    PT Assessment Patient needs continued PT services  PT Problem List Decreased strength;Decreased range of motion;Decreased activity tolerance;Decreased balance;Decreased mobility;Decreased knowledge of use of DME;Impaired sensation;Pain       PT Treatment Interventions DME instruction;Gait training;Stair training;Functional mobility training;Therapeutic activities;Therapeutic exercise;Balance training;Patient/family education    PT Goals (Current goals can be found in the Care Plan section)  Acute Rehab PT Goals Patient Stated Goal: go home PT Goal Formulation: With patient Time For Goal Achievement: 03/24/18 Potential to Achieve Goals: Good    Frequency Min 5X/week   Barriers to discharge Inaccessible home environment pt wtih 6 steps to enter       AM-PAC PT "6 Clicks" Mobility  Outcome Measure Help needed turning from your back to your side while in a flat bed without using bedrails?: A Little Help needed moving from lying on your back to sitting on the side of a flat bed without  using bedrails?: A Little Help needed moving to and from a bed to a chair (including a wheelchair)?: A Little Help needed standing up from a chair using your arms (e.g., wheelchair or bedside chair)?: A Little Help needed to walk in hospital room?: A Lot Help needed climbing 3-5 steps with a railing? : Total 6 Click Score: 15    End of Session Equipment Utilized During Treatment: Gait belt Activity Tolerance: Patient limited by fatigue Patient left: in chair;with call bell/phone within reach;with chair alarm set Nurse Communication: Mobility status;Need for lift equipment;Precautions;Weight bearing status PT Visit Diagnosis: Unsteadiness on feet (R26.81);Other abnormalities of gait and mobility (R26.89);Repeated falls (R29.6);Muscle weakness (generalized) (M62.81);History of falling (Z91.81);Difficulty in walking, not elsewhere classified (R26.2);Other symptoms and signs involving the nervous system (R29.898);Pain Pain - Right/Left: Right Pain - part of body: Ankle and joints of foot    Time: 6381-7711 PT Time Calculation (min) (ACUTE ONLY): 38 min   Charges:   PT Evaluation $PT Eval Moderate Complexity: 1 Mod PT Treatments $Gait Training: 8-22 mins $Self Care/Home Management: 8-22        Wess Baney B. Migdalia Dk PT, DPT Acute Rehabilitation Services Pager [phone] Office [phone]   [place] 03/10/2018, 12:21 PM

## 2018-03-10 NOTE — Progress Notes (Signed)
PROGRESS NOTE    Olivia Benson  BHA:[id number] DOB: [birth year] DOA: 03/08/2018 PCP: Helane Rima, MD  Outpatient Specialists:   Brief Narrative:  Olivia Benson is a 73 year old female, with past medical history significant for hypertension, diabetes mellitus, asthma, [name], liver cirrhosis, portal hypertension, GIB, CAD, non-STEMI, disseminated ovarian cancer with peritoneal carcinomatosis (stopped chemotherapy 4 weeks ago), RLS, thrombocytopenia, obesity, who presents with fall, right ankle pain.  Pt states that she ha on slippery bedroom shoes and slipped on garage stairs at about 1:00 PM on the day of admission, injured her right ankle, causing severe pain in right ankle. The pain was constant, sharp, 10 out of 10 severity, nonradiating.  On presentation to the hospital, patient was found to have pancytopenia (WBC 3.5, hemoglobin 11.1, platelet 67), renal function and electrolytes were okay, and X-ray showed right displaced distal fibula fracture.  Patient underwent open treatment of the right ankle fracture with open reduction and internal fixation.   03/10/2018: Patient seen alongside physical therapy team.  Patient's wishes to be discharged back on eventually.  Hopefully, patient will be able to be discharged back on Monday next 24 to 48 hours.  Physical therapy plans to continue working with the patient for now.    Assessment & Plan:   Principal Problem:   Displaced fracture of distal end of right fibula Active Problems:   Essential hypertension   Ascites   Disseminated ovarian cancer (HCC)   Liver cirrhosis secondary to NASH (HCC)   Restless legs syndrome   Pancytopenia, acquired ([place])   Asthma   Primary peritoneal carcinomatosis ([place])   Diabetes mellitus without complication (HCC)   Thrombocytopenia ([place])   GIB (gastrointestinal bleeding)   Fall   Chronic diastolic CHF (congestive heart failure) ([place])  Displaced fracture of distal end of right fibula:  As evidenced by  x-ray. Patient has moderate pain now. No neurovascular compromise. Orthopedic surgeon, Dr. Erlinda Hong was consulted-->plans to do surgery tomorrow. - will admit to Med-surg bed - Pain control: morphine prn and Norco - When necessary Zofran for nausea - Robaxin for muscle spasm - Appreciated Dr. Erlinda Hong consultation - type and cross - INR/PTT - PT/OT when able to (not ordered now) 03/09/2018: Patient underwent open treatment with internal fixation of the right ankle fracture. 03/10/2018: Continue physical therapy.  Likely discharge home with home PT in the next 24 to 48 hours.  Fall:  - Patient seems to have had a mechanical fall.   - Patient strongly denies any head or neck injury. Patient does not want to do CT of head and neck.  -Continue physical therapy.  RLS: -Rotigotine  HTN:  -Continue home medications: HCTZ -IV hydralazine prn 03/09/2018: Continue to monitor and optimize.  Liver cirrhosis secondary to NASH, ascites and esophageal varices:  Patient's mental status normal.  No active bleeding. INR 1.20.  Mental status normal. -pt is scheduled for esophageal banding procedure by Dr. 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 Addison Bailey. Patient was on chemotherapy before she had a GI bleeding 4 weeks ago. -Follow-up with Dr. Addison Bailey  Pancytopenia, acquired and Thrombocytopenia:  Hemoglobin stable 11.1.  Platelet is 67.  No active bleeding. -Follow-up by CBC  Asthma:  stable -prn albuterol   Diabetes mellitus without complication ([place]):  Last A1c 6.3 on 01/28/14, well controled.  Patient is taking degludec at home -SSI -Continue to monitor  and optimize.  Hx of GIB (gastrointestinal bleeding):  No active rectal bleeding -Increase the Protonix dose 40 mg daily to twice daily    DVT ppx: SCD Code Status: Full code Family  Communication:  Disposition Plan:  Will depend on hospital course.  Consults called:  Ortho, Dr. Erlinda Hong  Procedures:   Open reduction and internal fixation.    Antimicrobials:   None   Subjective: Pain is controlled. No new complaints.  Objective: Vitals:   03/09/18 2345 03/10/18 0214 03/10/18 1050 03/10/18 1307  BP: (!) 143/64 (!) 145/57 (!) 145/57 (!) 143/65  Pulse: 64 61 61 68  Resp: 18 18  16   Temp:  97.9 F (36.6 C)  97.6 F (36.4 C)  TempSrc:  Oral  Oral  SpO2: 93% 95%  100%  Weight:      Height:        Intake/Output Summary (Last 24 hours) at 03/10/2018 1419 Last data filed at 03/10/2018 1300 Gross per 24 hour  Intake 1900 ml  Output 1050 ml  Net 850 ml   Filed Weights   03/08/18 1723  Weight: 82.5 kg    Examination:  General exam: Appears calm and comfortable  Respiratory system: Clear to auscultation. Respiratory effort normal. Cardiovascular system: S1 & S2 heard. Gastrointestinal system: Abdomen is nondistended, soft and nontender. No organomegaly or masses felt. Normal bowel sounds heard. Central nervous system: Alert and oriented. No focal neurological deficits.  Data Reviewed: I have personally reviewed following labs and imaging studies  CBC: Recent Labs  Lab 03/08/18 2141 03/10/18 0311  WBC 3.5* 3.0*  NEUTROABS 2.0 2.3  HGB 11.1* 11.5*  HCT 36.0 37.0  MCV 101.4* 97.1  PLT 67* 62*   Basic Metabolic Panel: Recent Labs  Lab 03/08/18 2141 03/10/18 0311  NA 141 141  K 3.7 4.1  CL 109 109  CO2 24 22  GLUCOSE 113* 225*  BUN 22 14  CREATININE 0.84 1.00  CALCIUM 8.8* 8.8*  MG  --  1.1*  PHOS  --  3.6   GFR: Estimated Creatinine Clearance: 52.1 mL/min (by C-G formula based on SCr of 1 mg/dL). Liver Function Tests: Recent Labs  Lab 03/10/18 0311  ALBUMIN 3.2*   No results for input(s): LIPASE, AMYLASE in the last 168 hours. No results for input(s): AMMONIA in the last 168 hours. Coagulation Profile: Recent Labs  Lab  03/08/18 2141  INR 1.20   Cardiac Enzymes: No results for input(s): CKTOTAL, CKMB, CKMBINDEX, TROPONINI in the last 168 hours. BNP (last 3 results) No results for input(s): PROBNP in the last 8760 hours. HbA1C: No results for input(s): HGBA1C in the last 72 hours. CBG: Recent Labs  Lab 03/09/18 1502 03/09/18 1632 03/09/18 2256 03/10/18 0544 03/10/18 1117  GLUCAP 73 88 264* 171* 156*   Lipid Profile: No results for input(s): CHOL, HDL, LDLCALC, TRIG, CHOLHDL, LDLDIRECT in the last 72 hours. Thyroid Function Tests: No results for input(s): TSH, T4TOTAL, FREET4, T3FREE, THYROIDAB in the last 72 hours. Anemia Panel: No results for input(s): VITAMINB12, FOLATE, FERRITIN, TIBC, IRON, RETICCTPCT in the last 72 hours. Urine analysis:    Component Value Date/Time   COLORURINE YELLOW 10/22/2017 [place] 10/22/2017 1041   LABSPEC 1.015 10/22/2017 1041   PHURINE 5.0 10/22/2017 1041   GLUCOSEU NEGATIVE 10/22/2017 1041   HGBUR MODERATE (A) 10/22/2017 1041   BILIRUBINUR NEGATIVE 10/22/2017 1041   KETONESUR NEGATIVE 10/22/2017 [place] 02/13/2018 0749   UROBILINOGEN 0.2 03/22/2014 9470  NITRITE POSITIVE (A) 10/22/2017 1041   LEUKOCYTESUR SMALL (A) 10/22/2017 1041   Sepsis Labs: @LABRCNTIP (procalcitonin:4,lacticidven:4)  ) Recent Results (from the past 240 hour(s))  Surgical pcr screen     Status: Abnormal   Collection Time: 03/09/18  2:12 AM  Result Value Ref Range Status   MRSA, PCR NEGATIVE NEGATIVE Final   Staphylococcus aureus POSITIVE (A) NEGATIVE Final    Comment: (NOTE) The Xpert SA Assay (FDA approved for NASAL specimens in patients 22 years of age and older), is one component of a comprehensive surveillance program. It is not intended to diagnose infection nor to guide or monitor treatment.          Radiology Studies: Dg Tibia/fibula Right  Result Date: 03/08/2018 CLINICAL DATA:  Ankle fracture EXAM: RIGHT TIBIA AND FIBULA  - 2 VIEW COMPARISON:  None. FINDINGS: Acute mildly displaced distal fibular fracture. Diffuse soft tissue swelling. Probable cortical avulsion off the distal anterior aspect of the tibia. IMPRESSION: 1. Acute mildly displaced distal fibular fracture 2. Probable acute mildly displaced fracture off the anterior distal cortical surface of the tibia Electronically Signed   By: Donavan Foil M.D.   On: 03/08/2018 19:09   Dg Ankle Complete Right  Result Date: 03/09/2018 CLINICAL DATA:  ORIF RIGHT lateral malleolar fracture EXAM: Three views RIGHT ankle. COMPARISON:  None. FINDINGS: Internal plate fixation of the lateral malleolar fracture. Normal alignment. Ankle mortise intact. IMPRESSION: Internal fixation of lateral malleolar fracture without complication Electronically Signed   By: Suzy Bouchard M.D.   On: 03/09/2018 15:56   Dg Ankle Complete Right  Result Date: 03/08/2018 CLINICAL DATA:  Injury EXAM: RIGHT ANKLE - COMPLETE 3+ VIEW COMPARISON:  None. FINDINGS: Acute fracture involving the distal metaphysis of the fibula with less than 1/4 bone with lateral displacement of distal fracture fragment. Mortise is symmetric. Suspected small avulsion fracture injury involving the distal anterior inferior aspect of the tibia. Small plantar calcaneal spur. Diffuse soft tissue swelling. [place] or old injury adjacent to the tip of the fibular malleolus. IMPRESSION: 1. Acute mildly displaced distal fibular fracture 2. Probable acute cortical avulsion injury off the distal anterior tibia. Electronically Signed   By: Donavan Foil M.D.   On: 03/08/2018 19:08   Dg C-arm 1-60 Min  Result Date: 03/09/2018 : CLINICAL DATA: ORIF RIGHT lateral malleolar fracture EXAM: Three views RIGHT ankle. COMPARISON: None. FINDINGS: Internal plate fixation of the lateral malleolar fracture. Normal alignment. Ankle mortise intact. IMPRESSION: Internal fixation of lateral malleolar fracture without complication Electronically Signed    By: Suzy Bouchard M.D.   On: 03/09/2018 16:16        Scheduled Meds: . acetaminophen  1,000 mg Oral Q6H  . Chlorhexidine Gluconate Cloth  6 each Topical Daily  . cholecalciferol  1,000 Units Oral QHS  . docusate sodium  100 mg Oral BID  . gabapentin  600 mg Oral BID  . hydrochlorothiazide  12.5 mg Oral QPC breakfast  . insulin aspart  0-9 Units Subcutaneous TID WC  . insulin glargine  9 Units Subcutaneous Daily  . mupirocin ointment  1 application Nasal BID  . oxyCODONE  10 mg Oral Q12H  . pantoprazole  40 mg Oral BID  . propranolol  20 mg Oral BID  . rotigotine  1 patch Transdermal Daily   Continuous Infusions: . sodium chloride 125 mL/hr at 03/09/18 1732  . methocarbamol (ROBAXIN) IV       LOS: 2 days    Time spent: 25 minutes.  Dana Allan, MD  [facility] Pager #: [phone] 7PM-7AM contact night coverage as above

## 2018-03-10 NOTE — Progress Notes (Signed)
Subjective: 1 Day Post-Op Procedure(s) (LRB): OPEN REDUCTION INTERNAL FIXATION (ORIF) RIGHT LATERAL MALLEOLUS (Right) Patient reports pain as mild.  Block still working very well.  No complaints  Objective: Vital signs in last 24 hours: Temp:  [97.7 F (36.5 C)-98.4 F (36.9 C)] 97.9 F (36.6 C) (01/28 0214) Pulse Rate:  [55-68] 61 (01/28 0214) Resp:  [11-18] 18 (01/28 0214) BP: (134-170)/(57-72) 145/57 (01/28 0214) SpO2:  [93 %-100 %] 95 % (01/28 0214)  Intake/Output from previous day: 01/27 0701 - 01/28 0700 In: 1300 [I.V.:1200] Out: 100 [Blood:100] Intake/Output this shift: No intake/output data recorded.  Recent Labs    03/08/18 2141 03/10/18 0311  HGB 11.1* 11.5*   Recent Labs    03/08/18 2141 03/10/18 0311  WBC 3.5* 3.0*  RBC 3.55* 3.81*  HCT 36.0 37.0  PLT 67* 62*   Recent Labs    03/08/18 2141 03/10/18 0311  NA 141 141  K 3.7 4.1  CL 109 109  CO2 24 22  BUN 22 14  CREATININE 0.84 1.00  GLUCOSE 113* 225*  CALCIUM 8.8* 8.8*   Recent Labs    03/08/18 2141  INR 1.20    Neurologically intact Intact pulses distally Incision: dressing C/D/I No cellulitis present Compartment soft  Cam walker in place.  No drainage to bandage underneath.  Block still functioning very well    Assessment/Plan: 1 Day Post-Op Procedure(s) (LRB): OPEN REDUCTION INTERNAL FIXATION (ORIF) RIGHT LATERAL MALLEOLUS (Right) Up with therapy  NWB RLE x 4 weeks Continue cam walker May come out of cam walker to work on ROM as tolerated.  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  WE WILL D/C [facility] DUE TO THIS.  WILL DEFER DVT PPX TO HOSPITALIST AT THIS TIME. F/u with Dr. Erlinda Hong in two weeks for suture removal    Aundra Dubin 03/10/2018, 8:00 AM

## 2018-03-11 DIAGNOSIS — I5032 Chronic diastolic (congestive) heart failure: Secondary | ICD-10-CM

## 2018-03-11 DIAGNOSIS — G2581 Restless legs syndrome: Secondary | ICD-10-CM

## 2018-03-11 LAB — COMPREHENSIVE METABOLIC PANEL
ALT: 24 U/L (ref 0–44)
AST: 34 U/L (ref 15–41)
Albumin: 3.4 g/dL — ABNORMAL LOW (ref 3.5–5.0)
Alkaline Phosphatase: 65 U/L (ref 38–126)
Anion gap: 8 (ref 5–15)
BUN: 14 mg/dL (ref 8–23)
CO2: 24 mmol/L (ref 22–32)
Calcium: 8.6 mg/dL — ABNORMAL LOW (ref 8.9–10.3)
Chloride: 108 mmol/L (ref 98–111)
Creatinine, Ser: 0.81 mg/dL (ref 0.44–1.00)
GFR calc Af Amer: >60 mL/min (ref >60)
GFR calc non Af Amer: >60 mL/min (ref >60)
Glucose, Bld: 155 mg/dL — ABNORMAL HIGH (ref 70–99)
Potassium: 4 mmol/L (ref 3.5–5.1)
Sodium: 140 mmol/L (ref 135–145)
Total Bilirubin: 0.9 mg/dL (ref 0.3–1.2)
Total Protein: 6.6 g/dL (ref 6.5–8.1)

## 2018-03-11 LAB — CBC WITH DIFFERENTIAL/PLATELET
Abs Immature Granulocytes: 0.02 10*3/uL (ref 0.00–0.07)
Basophils Absolute: 0 10*3/uL (ref 0.0–0.1)
Basophils Relative: 0 %
Eosinophils Absolute: 0.2 10*3/uL (ref 0.0–0.5)
Eosinophils Relative: 3 %
HCT: 36.6 % (ref 36.0–46.0)
Hemoglobin: 11.1 g/dL — ABNORMAL LOW (ref 12.0–15.0)
Immature Granulocytes: 0 %
Lymphocytes Relative: 30 %
Lymphs Abs: 1.5 10*3/uL (ref 0.7–4.0)
MCH: 30.4 pg (ref 26.0–34.0)
MCHC: 30.3 g/dL (ref 30.0–36.0)
MCV: 100.3 fL — ABNORMAL HIGH (ref 80.0–100.0)
Monocytes Absolute: 0.3 10*3/uL (ref 0.1–1.0)
Monocytes Relative: 6 %
Neutro Abs: 3 10*3/uL (ref 1.7–7.7)
Neutrophils Relative %: 61 %
Platelets: 72 10*3/uL — ABNORMAL LOW (ref 150–400)
RBC: 3.65 MIL/uL — ABNORMAL LOW (ref 3.87–5.11)
RDW: 18.9 % — ABNORMAL HIGH (ref 11.5–15.5)
WBC: 5.1 10*3/uL (ref 4.0–10.5)
nRBC: 0 % (ref 0.0–0.2)

## 2018-03-11 LAB — GLUCOSE, CAPILLARY
Glucose-Capillary: 110 mg/dL — ABNORMAL HIGH (ref 70–99)
Glucose-Capillary: 174 mg/dL — ABNORMAL HIGH (ref 70–99)
Glucose-Capillary: 80 mg/dL (ref 70–99)

## 2018-03-11 LAB — PHOSPHORUS: Phosphorus: 4.7 mg/dL — ABNORMAL HIGH (ref 2.5–4.6)

## 2018-03-11 LAB — MAGNESIUM: Magnesium: 1 mg/dL — ABNORMAL LOW (ref 1.7–2.4)

## 2018-03-11 MED ORDER — MAGNESIUM SULFATE 4 GM/100ML IV SOLN
4.0000 g | Freq: Once | INTRAVENOUS | Status: AC
  Administered 2018-03-11: 4 g via INTRAVENOUS
  Filled 2018-03-11: qty 100

## 2018-03-11 MED ORDER — SENNOSIDES-DOCUSATE SODIUM 8.6-50 MG PO TABS: 1.0000 | ORAL_TABLET | Freq: Every evening | ORAL | 0 refills | Status: DC | PRN

## 2018-03-11 MED ORDER — METHOCARBAMOL 500 MG PO TABS: 500.0000 mg | ORAL_TABLET | Freq: Four times a day (QID) | ORAL | 0 refills | Status: DC | PRN

## 2018-03-11 MED ORDER — DM-GUAIFENESIN ER 30-600 MG PO TB12: 1.0000 | ORAL_TABLET | Freq: Two times a day (BID) | ORAL | 0 refills | Status: DC | PRN

## 2018-03-11 MED ORDER — HEPARIN SOD (PORK) LOCK FLUSH 100 UNIT/ML IV SOLN
500.0000 [IU] | INTRAVENOUS | Status: AC | PRN
  Administered 2018-03-11: 500 [IU]

## 2018-03-11 MED ORDER — DOCUSATE SODIUM 100 MG PO CAPS: 100.0000 mg | ORAL_CAPSULE | Freq: Two times a day (BID) | ORAL | 0 refills | Status: DC

## 2018-03-11 MED ORDER — PANTOPRAZOLE SODIUM 40 MG PO TBEC: 40.0000 mg | DELAYED_RELEASE_TABLET | Freq: Two times a day (BID) | ORAL | 0 refills | Status: DC

## 2018-03-11 NOTE — Care Management Note (Addendum)
Case Management Note  Patient Details  Name: Olivia Benson MRN: [id number] Date of Birth: 05/07/1945  Subjective/Objective:  73 yr old female s/p ORIF of right lateral malleolus.                  Action/Plan: Case manager received call that [facility] will be providing [place] at discharge. Patient and husband in agreement.  Patient will not be able to ambulate up stairs to home, will need PTAR to transport home.   Expected Discharge Date:   03/11/18               Expected Discharge Plan:  [place]  In-House Referral:  NA  Discharge planning Services  CM Consult  Post Acute Care Choice:  Home Health Choice offered to:  Patient  DME Arranged:   RW/3in1 DME Agency:   [facility]  HH Arranged:  PT HH Agency:  [facility]  Status of Service:  Completed.  If discussed at [place] of Stay Meetings, dates discussed:    Additional Comments:  Ninfa Meeker, RN 03/11/2018, 2:55 PM

## 2018-03-11 NOTE — Discharge Summary (Signed)
Physician Discharge Summary  Olivia Benson CWC:[id number] DOB: [birth year] DOA: 03/08/2018  PCP: Helane Rima, MD  Admit date: 03/08/2018 Discharge date: 03/11/2018  Admitted From: Home Disposition: Home with Home Health PT/OT  Recommendations for Outpatient Follow-up:  1. Follow up with PCP in 1-2 weeks 2. Follow up with Orthopedic Surgery Dr. Erlinda Hong within 2 weeks 3. Follow up with Gastroenterology Dr. Therisa Doyne for Gastric Banding  4. Follow up with Medical Oncology Dr. Alvy Bimler within 1 week 5. Please obtain CMP/CBC, Mag, Phos in one week 6. Please follow up on the following pending results:  Home Health: Yes Equipment/Devices: Conservation officer, nature; 3in1   Discharge Condition: Stable  CODE STATUS: FULL CODE Diet recommendation: Regular Diet  Brief/Interim Summary: Olivia Mclaurin Ryanis a 73 year old female, with past medical history significant for hypertension, diabetes mellitus, asthma, [place],livercirrhosis, portal hypertension, GIB,CAD, non-STEMI, disseminated ovarian cancer with peritoneal carcinomatosis (stopped chemotherapy 4 weeks ago), RLS, thrombocytopenia, obesity, who presents with fall, right ankle pain.  Pt states that she haon slippery bedroom shoes and slipped on garage stairs at about 1:00 PM on the day of admission, injured herright ankle, causing severe pain in right ankle. Thepain was constant, sharp, 10 out of 10 severity, nonradiating.  On presentation to the hospital, patient was found to havepancytopenia (WBC 3.5, hemoglobin 11.1, platelet 67), renal function and electrolytes were okay, andX-ray showed right displaced distal fibula fracture.  Patient underwent open treatment of therightankle fracture with open reduction and internal fixation. She worked well with PT and they recommended Home with Home Health PT/OT. Hospitalization was complicated by Hypomagnesemia which was replete prior to D/C. She was deemed medically stable to D/C home and follow up with PCP within 1 week  and Orthopedic Surgery within 1-2 weeks.   Discharge Diagnoses:  Principal Problem:   Displaced fracture of distal end of right fibula Active Problems:   Essential hypertension   Ascites   Disseminated ovarian cancer (HCC)   Liver cirrhosis secondary to NASH (HCC)   Restless legs syndrome   Pancytopenia, acquired ([place])   Asthma   Primary peritoneal carcinomatosis ([place])   Diabetes mellitus without complication (HCC)   Thrombocytopenia ([place])   GIB (gastrointestinal bleeding)   Fall   Chronic diastolic CHF (congestive heart failure) ([place])  Displaced fracture of distal end of right fibula: -As evidenced by x-ray. Patient has moderate pain now. No neurovascular compromise. Orthopedic surgeon, [place] consulted -Admitted to Med-surg bed -Pain control: morphine prn andNorco -When necessary Zofran for nausea -Robaxin for muscle spasm - Appreciated Dr.Xuconsultation and she underwent surgical intervention on 03/09/2018 - type and cross - INR/PTT - PT/OT recommending Home Health -No Pharmacologic VTE prophylaxis due to Hx of GIB and Pancytopenia  Fall: - Patient seems to have had a mechanical fall.  - Patient strongly denies any head or neck injury. Patient does not want to do CT of head and neck. -Continue physical therapy at Home  RLS: -C/w Rotigotine  HTN:  -Continue home medications:HCTZ and Popanolol 20 mg po BID -IV hydralazine prn 03/09/2018: Continue to monitor and optimize.  Liver cirrhosis secondary to NASH,ascites andesophageal varices: -Patient's mental status normal. No active bleeding. INR 1.20.Mental status normal. -pt is scheduled foresophageal banding procedure by Dr. 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 Alvy Bimler. Patient was on chemotherapy before she had a GI bleeding 4 weeks ago. -Follow-up with  Dr.Gorsuch at D/C   Pancytopenia, acquiredandThrombocytopenia: -Hemoglobin  stable 11.1. Platelet is 72.  -No active bleeding but will continue to Monitor for S/Sx of Bleeding -Follow-up by CBC as an outpatient   Asthma -stable -prn albuterol  Diabetes mellitus without complication ([place]): -Last A1c6.3 on 01/28/14, well controled.  Patient is takingdegludecat home -C/w SSI -Continue to monitor and optimize. CBG's ranging from 110-174  Hx ofGIB (gastrointestinal bleeding): No active rectal bleeding -Increase the Protonix dose 40 mg daily to twice daily  Hypomagnesemia -Patient's Mag Level was 1.0 -Replete with IV Mag Sulfate 4 grams prior to D/C -Continue to Monitor and Replete as Necessary -Repeat Mag Level at PCP office.   Obesity -Estimated body mass index is 33.27 kg/m as calculated from the following:   Height as of this encounter: 5' 2"  (1.575 m).   Weight as of this encounter: 82.5 kg. -Weight Loss Counseling given  Discharge Instructions Discharge Instructions    Call MD for:  difficulty breathing, headache or visual disturbances   Complete by:  As directed    Call MD for:  extreme fatigue   Complete by:  As directed    Call MD for:  hives   Complete by:  As directed    Call MD for:  persistant dizziness or light-headedness   Complete by:  As directed    Call MD for:  persistant nausea and vomiting   Complete by:  As directed    Call MD for:  redness, tenderness, or signs of infection (pain, swelling, redness, odor or green/yellow discharge around incision site)   Complete by:  As directed    Call MD for:  severe uncontrolled pain   Complete by:  As directed    Call MD for:  temperature >100.4   Complete by:  As directed    Diet - low sodium heart healthy   Complete by:  As directed    Discharge instructions   Complete by:  As directed    You were cared for by a hospitalist during your hospital stay. If you have any questions about your  discharge medications or the care you received while you were in the hospital after you are discharged, you can call the unit and ask to speak with the hospitalist on call if the hospitalist that took care of you is not available. Once you are discharged, your primary care physician will handle any further medical issues. Please note that NO REFILLS for any discharge medications will be authorized once you are discharged, as it is imperative that you return to your primary care physician (or establish a relationship with a primary care physician if you do not have one) for your aftercare needs so that they can reassess your need for medications and monitor your lab values.  Follow up with PCP, Orthopedic Surgery, Gastroenterology, and Medical Oncology. Take all medications as prescribed. If symptoms change or worsen please return to the ED for evaluation   Increase activity slowly   Complete by:  As directed      Allergies as of 03/11/2018      Reactions   Shellfish Allergy Anaphylaxis   HER THROAT SWELLS UP AND CLOSES   Benadryl [diphenhydramine Hcl] Other (See Comments)   Restless legs   Penicillins Rash   DID THE REACTION INVOLVE: Swelling of the face/tongue/throat, SOB, or low BP? No Sudden or severe rash/hives, skin peeling, or the inside of the mouth or nose? No Did it require medical treatment? No When did it last happen?Childhood allergy If all above answers are "NO",  may proceed with cephalosporin use.   Tape Itching   Red itch with some tapes      Medication List    TAKE these medications   cholecalciferol 1000 units tablet Commonly known as:  VITAMIN D Take 1,000 Units by mouth at bedtime.   dextromethorphan-guaiFENesin 30-600 MG 12hr tablet Commonly known as:  MUCINEX DM Take 1 tablet by mouth 2 (two) times daily as needed for cough.   docusate sodium 100 MG capsule Commonly known as:  COLACE Take 1 capsule (100 mg total) by mouth 2 (two) times daily.    gabapentin 300 MG capsule Commonly known as:  NEURONTIN Take 2 capsules (600 mg total) by mouth 2 (two) times daily.   hydrochlorothiazide 12.5 MG capsule Commonly known as:  MICROZIDE Take 12.5 mg by mouth daily after breakfast.   HYDROcodone-acetaminophen 5-325 MG tablet Commonly known as:  NORCO/VICODIN take 1 tablet by mouth every 6 hours if needed for MODERATE PAIN What changed:    how much to take  how to take this  when to take this  reasons to take this  additional instructions   lidocaine-prilocaine cream Commonly known as:  EMLA Apply to Porta-cath  1-2 hours prior to access as directed.   methocarbamol 500 MG tablet Commonly known as:  ROBAXIN Take 1 tablet (500 mg total) by mouth every 6 (six) hours as needed for muscle spasms.   ondansetron 8 MG tablet Commonly known as:  ZOFRAN Take 1 tablet (8 mg total) by mouth every 8 (eight) hours as needed for nausea.   pantoprazole 40 MG tablet Commonly known as:  PROTONIX Take 1 tablet (40 mg total) by mouth 2 (two) times daily. What changed:  when to take this   propranolol 20 MG tablet Commonly known as:  INDERAL Take 1 tablet (20 mg total) by mouth 2 (two) times daily.   rotigotine 4 MG/24HR Commonly known as:  NEUPRO Place 1 patch onto the skin daily.   senna-docusate 8.6-50 MG tablet Commonly known as:  Senokot-S Take 1 tablet by mouth at bedtime as needed for mild constipation.   TRESIBA FLEXTOUCH 100 UNIT/ML Sopn FlexTouch Pen Generic drug:  insulin degludec Inject 18 Units into the skin daily after breakfast.            Durable Medical Equipment  (From admission, onward)         Start     Ordered   03/11/18 1206  For home use only DME 3 n 1  Once     03/11/18 1206   03/11/18 1206  For home use only DME Walker rolling  Once    Question:  Patient needs a walker to treat with the following condition  Answer:  Ankle fracture   03/11/18 1206   03/09/18 1618  DME Walker rolling  Once     Question:  Patient needs a walker to treat with the following condition  Answer:  History of open reduction and internal fixation (ORIF) procedure   03/09/18 1617   03/09/18 1618  DME 3 n 1  Once     03/09/18 1617   03/09/18 1618  DME Bedside commode  Once    Question:  Patient needs a bedside commode to treat with the following condition  Answer:  History of open reduction and internal fixation (ORIF) procedure   03/09/18 1617         Follow-up Information    Leandrew Koyanagi, MD In 2 weeks.   Specialty:  Orthopedic  Surgery Why:  For suture removal, For wound re-check Contact information: [place] [address] [phone]        [facility] Follow up.   Specialty:  [place] Why:  A representative from [facility] will contact you to arrange start date and time for your therapy. Contact information: [place] [address] [phone]        Helane Rima, MD. Call.   Specialty:  Family Medicine Why:  Follow up within 1 week Contact information: [place] [place] [address] [phone]        Ronnette Juniper, MD. Call.   Specialty:  Gastroenterology Why:  Follow up within 2 weeks Contact information: [place] [address] [phone]        [name], MD. Call.   Specialty:  Hematology and Oncology Why:  Follow up within 1 week  Contact information: [place] [address] [phone]          Allergies  Allergen Reactions  . Shellfish Allergy Anaphylaxis    HER THROAT SWELLS UP AND CLOSES  . Benadryl [Diphenhydramine Hcl] Other (See Comments)    Restless legs   . Penicillins Rash    DID THE REACTION INVOLVE: Swelling of the face/tongue/throat, SOB, or low BP? No Sudden or severe rash/hives, skin peeling, or the inside of the mouth or nose? No Did it require medical treatment? No When  did it last happen?Childhood allergy If all above answers are "NO", may proceed with cephalosporin use.   . Tape Itching    Red itch with some tapes   Consultations:  Orthopedic Surgery  Procedures/Studies: Dg Tibia/fibula Right  Result Date: 03/08/2018 CLINICAL DATA:  Ankle fracture EXAM: RIGHT TIBIA AND FIBULA - 2 VIEW COMPARISON:  None. FINDINGS: Acute mildly displaced distal fibular fracture. Diffuse soft tissue swelling. Probable cortical avulsion off the distal anterior aspect of the tibia. IMPRESSION: 1. Acute mildly displaced distal fibular fracture 2. Probable acute mildly displaced fracture off the anterior distal cortical surface of the tibia Electronically Signed   By: Donavan Foil M.D.   On: 03/08/2018 19:09   Dg Ankle Complete Right  Result Date: 03/09/2018 CLINICAL DATA:  ORIF RIGHT lateral malleolar fracture EXAM: Three views RIGHT ankle. COMPARISON:  None. FINDINGS: Internal plate fixation of the lateral malleolar fracture. Normal alignment. Ankle mortise intact. IMPRESSION: Internal fixation of lateral malleolar fracture without complication Electronically Signed   By: Suzy Bouchard M.D.   On: 03/09/2018 15:56   Dg Ankle Complete Right  Result Date: 03/08/2018 CLINICAL DATA:  Injury EXAM: RIGHT ANKLE - COMPLETE 3+ VIEW COMPARISON:  None. FINDINGS: Acute fracture involving the distal metaphysis of the fibula with less than 1/4 bone with lateral displacement of distal fracture fragment. Mortise is symmetric. Suspected small avulsion fracture injury involving the distal anterior inferior aspect of the tibia. Small plantar calcaneal spur. Diffuse soft tissue swelling. [place] or old injury adjacent to the tip of the fibular malleolus. IMPRESSION: 1. Acute mildly displaced distal fibular fracture 2. Probable acute cortical avulsion injury off the distal anterior tibia. Electronically Signed   By: Donavan Foil M.D.   On: 03/08/2018 19:08   Dg C-arm 1-60 Min  Result  Date: 03/09/2018 : CLINICAL DATA: ORIF RIGHT lateral malleolar fracture EXAM: Three views RIGHT ankle. COMPARISON: None. FINDINGS: Internal plate fixation of the lateral malleolar fracture. Normal alignment. Ankle mortise intact. IMPRESSION: Internal fixation  of lateral malleolar fracture without complication Electronically Signed   By: Suzy Bouchard M.D.   On: 03/09/2018 16:16    Subjective: Examined at bedside was doing well.  Denying chest pain, lightheadedness or dizziness.  No nausea or vomiting.  Felt well and ready to go home.  No other concerns or complaints at this time  Discharge Exam: Vitals:   03/11/18 0530 03/11/18 0908  BP: 138/67 138/67  Pulse: 64 64  Resp: 14   Temp: (!) 97.5 F (36.4 C)   SpO2: 98%    Vitals:   03/10/18 1307 03/10/18 2031 03/11/18 0530 03/11/18 0908  BP: (!) 143/65 (!) 155/67 138/67 138/67  Pulse: 68 68 64 64  Resp: 16 18 14    Temp: 97.6 F (36.4 C) 98.5 F (36.9 C) (!) 97.5 F (36.4 C)   TempSrc: Oral Oral Oral   SpO2: 100% 100% 98%   Weight:      Height:       General: Pt is alert, awake, not in acute distress Cardiovascular: RRR, S1/S2 +, no rubs, no gallops Respiratory: CTA bilaterally, no wheezing, no rhonchi Abdominal: Soft, NT, ND, bowel sounds + Extremities: no edema, no cyanosis; Right Ankle in Boot  The results of significant diagnostics from this hospitalization (including imaging, microbiology, ancillary and laboratory) are listed below for reference.    Microbiology: Recent Results (from the past 240 hour(s))  Surgical pcr screen     Status: Abnormal   Collection Time: 03/09/18  2:12 AM  Result Value Ref Range Status   MRSA, PCR NEGATIVE NEGATIVE Final   Staphylococcus aureus POSITIVE (A) NEGATIVE Final    Comment: (NOTE) The Xpert SA Assay (FDA approved for NASAL specimens in patients 68 years of age and older), is one component of a comprehensive surveillance program. It is not intended to diagnose infection nor  to guide or monitor treatment.     Labs: BNP (last 3 results) No results for input(s): BNP in the last 8760 hours. Basic Metabolic Panel: Recent Labs  Lab 03/08/18 2141 03/10/18 0311 03/11/18 1355  NA 141 141 140  K 3.7 4.1 4.0  CL 109 109 108  CO2 24 22 24   GLUCOSE 113* 225* 155*  BUN 22 14 14   CREATININE 0.84 1.00 0.81  CALCIUM 8.8* 8.8* 8.6*  MG  --  1.1* 1.0*  PHOS  --  3.6 4.7*   Liver Function Tests: Recent Labs  Lab 03/10/18 0311 03/11/18 1355  AST  --  34  ALT  --  24  ALKPHOS  --  65  BILITOT  --  0.9  PROT  --  6.6  ALBUMIN 3.2* 3.4*   No results for input(s): LIPASE, AMYLASE in the last 168 hours. No results for input(s): AMMONIA in the last 168 hours. CBC: Recent Labs  Lab 03/08/18 2141 03/10/18 0311 03/11/18 1355  WBC 3.5* 3.0* 5.1  NEUTROABS 2.0 2.3 3.0  HGB 11.1* 11.5* 11.1*  HCT 36.0 37.0 36.6  MCV 101.4* 97.1 100.3*  PLT 67* 62* 72*   Cardiac Enzymes: No results for input(s): CKTOTAL, CKMB, CKMBINDEX, TROPONINI in the last 168 hours. BNP: Invalid input(s): POCBNP CBG: Recent Labs  Lab 03/10/18 1117 03/10/18 1617 03/10/18 2142 03/11/18 0630 03/11/18 1109  GLUCAP 156* 140* 160* 110* 174*   D-Dimer No results for input(s): DDIMER in the last 72 hours. Hgb A1c No results for input(s): HGBA1C in the last 72 hours. Lipid Profile No results for input(s): CHOL, HDL, LDLCALC, TRIG, CHOLHDL, LDLDIRECT in  the last 72 hours. Thyroid function studies No results for input(s): TSH, T4TOTAL, T3FREE, THYROIDAB in the last 72 hours.  Invalid input(s): FREET3 Anemia work up No results for input(s): VITAMINB12, FOLATE, FERRITIN, TIBC, IRON, RETICCTPCT in the last 72 hours. Urinalysis    Component Value Date/Time   COLORURINE YELLOW 10/22/2017 1041   APPEARANCEUR CLEAR 10/22/2017 1041   LABSPEC 1.015 10/22/2017 1041   PHURINE 5.0 10/22/2017 1041   GLUCOSEU NEGATIVE 10/22/2017 1041   HGBUR MODERATE (A) 10/22/2017 1041   BILIRUBINUR  NEGATIVE 10/22/2017 1041   KETONESUR NEGATIVE 10/22/2017 1041   PROTEINUR NEGATIVE 02/13/2018 0749   UROBILINOGEN 0.2 03/22/2014 0917   NITRITE POSITIVE (A) 10/22/2017 1041   LEUKOCYTESUR SMALL (A) 10/22/2017 1041   Sepsis Labs Invalid input(s): PROCALCITONIN,  WBC,  LACTICIDVEN Microbiology Recent Results (from the past 240 hour(s))  Surgical pcr screen     Status: Abnormal   Collection Time: 03/09/18  2:12 AM  Result Value Ref Range Status   MRSA, PCR NEGATIVE NEGATIVE Final   Staphylococcus aureus POSITIVE (A) NEGATIVE Final    Comment: (NOTE) The Xpert SA Assay (FDA approved for NASAL specimens in patients 32 years of age and older), is one component of a comprehensive surveillance program. It is not intended to diagnose infection nor to guide or monitor treatment.    Time coordinating discharge: 35 minutes  SIGNED:  Kerney Elbe, DO [facility] 03/11/2018, 4:00 PM Pager is on AMION  If 7PM-7AM, please contact night-coverage [email or web address] Password [email or web address]

## 2018-03-11 NOTE — Care Management Important Message (Signed)
Important Message  Patient Details  Name: Olivia Benson MRN: [id number] Date of Birth: Jul 23, 1945   Medicare Important Message Given:  Yes    Orbie Pyo 03/11/2018, 4:22 PM

## 2018-03-11 NOTE — Evaluation (Signed)
Occupational Therapy Evaluation Patient Details Name: Olivia Benson MRN: [id number] DOB: 07/29/1945 Today's Date: 03/11/2018    History of Present Illness 73 year old female, with past medical history significant for hypertension, diabetes mellitus, asthma, [name], liver cirrhosis, portal hypertension, GIB, CAD, non-STEMI, disseminated ovarian cancer with peritoneal carcinomatosis (stopped chemotherapy 4 weeks ago), RLS, thrombocytopenia, obesity, who presents with fall, right ankle pain. S/p R ankle ORIF 03/09/18. NWB    Clinical Impression   PTA Pt independent in ADL and mobility. Pt is currently min A for transfers, however fatigues very quickly and due to short stature, generalized weakness in BUE she struggles to maintain NWB. She can reach down to her feet for LB dressing, but requires assistance with balance/and to don CAM boot. Reviewed sequence and safety with Pt and husband. Pt also educated on walk in shower transfer - but recommended practicing with therapist in home environment first. OT to defer further OT to [facility] environment to maximize safety and independence in ADL.     Follow Up Recommendations  Home health OT;Supervision/Assistance - 24 hour(initially)    Equipment Recommendations  3 in 1 bedside commode    Recommendations for Other Services       Precautions / Restrictions Precautions Precautions: Fall Precaution Comments: hx of falling Required Braces or Orthoses: Other Brace(CAM Boot) Other Brace: CAM Boot Restrictions Weight Bearing Restrictions: Yes RLE Weight Bearing: Non weight bearing      Mobility Bed Mobility Overal bed mobility: Needs Assistance Bed Mobility: Supine to Sit;Sit to Supine     Supine to sit: Supervision;HOB elevated Sit to supine: Supervision;HOB elevated   General bed mobility comments: Pt in chair at beginning and end of session  Transfers Overall transfer level: Needs assistance Equipment used: Rolling walker (2  wheeled) Transfers: Sit to/from [facility] Sit to Stand: Min assist Stand pivot transfers: Min assist       General transfer comment: minA for power up and steadying from bed and recliner, has to place R LE on floor to steady for power up but has minimal weightbearing. Once in upright pt able to maintain NWB without too much difficulty     Balance Overall balance assessment: Needs assistance Sitting-balance support: No upper extremity supported;Feet supported Sitting balance-Leahy Scale: Fair     Standing balance support: Single extremity supported;During functional activity Standing balance-Leahy Scale: Poor Standing balance comment: requires at least single UE support to maintain balance while pulling pants up                           ADL either performed or assessed with clinical judgement   ADL Overall ADL's : Needs assistance/impaired Eating/Feeding: Set up;Sitting   Grooming: Set up;Sitting Grooming Details (indicate cue type and reason): unable to maintain NWB in standing Upper Body Bathing: Set up;Sitting   Lower Body Bathing: Set up;Sitting/lateral leans Lower Body Bathing Details (indicate cue type and reason): reviewed AE if Pt interested for ease, she can reach down to her toes Upper Body Dressing : Set up;Sitting   Lower Body Dressing: Moderate assistance;Bed level Lower Body Dressing Details (indicate cue type and reason): Pt able to reach down to feet, requires assist for WB precautions, and assist for boot Toilet Transfer: Minimal assistance;Stand-pivot;BSC;RW Toilet Transfer Details (indicate cue type and reason): Pt fatigues quickly and was advised to use [facility] Toileting- Clothing Manipulation and Hygiene: Min guard;Sitting/lateral lean   Tub/ Shower Transfer: Walk-in shower;Minimal assistance;Ambulation;3 in Mudlogger Details (  indicate cue type and reason): in ortho gym, educated to wait until [facility] therapy  comes to perform at home Functional mobility during ADLs: Min guard;Minimal assistance;Rolling walker General ADL Comments: fatigues quiclkly, requires TDWB due to inability to hold up boot - even with shoe on to assist with lift     Vision         Perception     Praxis      Pertinent Vitals/Pain Pain Assessment: 0-10 Pain Score: 9  Pain Location: R ankle Pain Descriptors / Indicators: Throbbing;Aching;Sore Pain Intervention(s): Monitored during session;Repositioned;Other (comment)(elevated at end of session)     Hand Dominance Right   Extremity/Trunk Assessment Upper Extremity Assessment Upper Extremity Assessment: Generalized weakness   Lower Extremity Assessment Lower Extremity Assessment: Defer to PT evaluation   Cervical / Trunk Assessment Cervical / Trunk Assessment: Normal   Communication Communication Communication: No difficulties   Cognition Arousal/Alertness: Awake/alert Behavior During Therapy: WFL for tasks assessed/performed Overall Cognitive Status: Within Functional Limits for tasks assessed                                     General Comments  Husband present for session, CAM walker in place    Exercises     Shoulder Instructions      Home Living Family/patient expects to be discharged to:: Private residence Living Arrangements: Spouse/significant other Available Help at Discharge: Family Type of Home: House Home Access: Stairs to enter Technical brewer of Steps: 6 Entrance Stairs-Rails: Right;Left Home Layout: One level     Bathroom Shower/Tub: Occupational psychologist: Standard Bathroom Accessibility: Yes How Accessible: Accessible via walker Home Equipment: Grab bars - tub/shower;Crutches          Prior Functioning/Environment Level of Independence: Independent                 OT Problem List: Decreased range of motion;Decreased activity tolerance;Impaired balance (sitting and/or  standing);Decreased knowledge of use of DME or AE;Decreased knowledge of precautions;Pain      OT Treatment/Interventions:      OT Goals(Current goals can be found in the care plan section) Acute Rehab OT Goals Patient Stated Goal: go home OT Goal Formulation: With patient/family Time For Goal Achievement: 03/25/18 Potential to Achieve Goals: Good  OT Frequency:     Barriers to D/C:            Co-evaluation              AM-PAC OT "6 Clicks" Daily Activity     Outcome Measure Help from another person eating meals?: None Help from another person taking care of personal grooming?: None(seated) Help from another person toileting, which includes using toliet, bedpan, or urinal?: A Little Help from another person bathing (including washing, rinsing, drying)?: A Little Help from another person to put on and taking off regular upper body clothing?: None Help from another person to put on and taking off regular lower body clothing?: A Lot 6 Click Score: 20   End of Session Equipment Utilized During Treatment: Gait belt;Rolling walker;Other (comment)(CAM boot) Nurse Communication: Mobility status;Weight bearing status;Patient requests pain meds  Activity Tolerance: Patient tolerated treatment well;Patient limited by fatigue Patient left: Other (comment)(in recliner returning to room with PT)  OT Visit Diagnosis: Unsteadiness on feet (R26.81);Other abnormalities of gait and mobility (R26.89);History of falling (Z91.81);Pain Pain - Right/Left: Right Pain - part of body: Ankle and joints  of foot                Time: 1515-1545 OT Time Calculation (min): 30 min Charges:  OT General Charges $OT Visit: 1 Visit OT Evaluation $OT Eval Moderate Complexity: 1 Mod OT Treatments $Self Care/Home Management : 8-22 mins  Hulda Humphrey OTR/L Acute Rehabilitation Services Pager: [phone] Office: [phone]  Merri Ray Shiv Shuey 03/11/2018, 5:28 PM

## 2018-03-11 NOTE — Progress Notes (Signed)
Physical Therapy Treatment Patient Details Name: Olivia Benson MRN: [id number] DOB: [birth year] Today's Date: 03/11/2018    History of Present Illness 73 year old female, with past medical history significant for hypertension, diabetes mellitus, asthma, [name], liver cirrhosis, portal hypertension, GIB, CAD, non-STEMI, disseminated ovarian cancer with peritoneal carcinomatosis (stopped chemotherapy 4 weeks ago), RLS, thrombocytopenia, obesity, who presents with fall, right ankle pain. S/p R ankle ORIF 03/09/18. NWB     PT Comments    Pt in bed on entry but willing to try stair training. Pt's nerve block has worn off and is now experiencing 9/10 pain in her R ankle, this pain as well as NWB status and decreased UE strength and endurance is limiting her safe mobility. Pt is supervision for bed mobility and minA for transfers and ambulation of 4 feet with RW. Pt attempted but was not able to provide enough clearance with hopping to ascend steps, as a result pt will require PTAR transfer home. Once inside the house pt will be able to mobilize in transport chair with help of her husband. Plans are for discharge home this afternoon.     Follow Up Recommendations  Supervision/Assistance - 24 hour;Home health PT     Equipment Recommendations  Rolling walker with 5" wheels;3in1 (PT)    Recommendations for Other Services OT consult     Precautions / Restrictions Precautions Precautions: Fall Precaution Comments: hx of falling Required Braces or Orthoses: Other Brace(CAM Boot) Other Brace: CAM Boot Restrictions Weight Bearing Restrictions: Yes RLE Weight Bearing: Non weight bearing    Mobility  Bed Mobility Overal bed mobility: Needs Assistance Bed Mobility: Supine to Sit;Sit to Supine     Supine to sit: Supervision;HOB elevated Sit to supine: Supervision;HOB elevated   General bed mobility comments: supervision for safety, increased time and effort and use of bedrail to pull to  EoB  Transfers Overall transfer level: Needs assistance Equipment used: Rolling walker (2 wheeled) Transfers: Sit to/from [facility] Sit to Stand: Min assist Stand pivot transfers: Min assist       General transfer comment: minA for power up and steadying from bed and recliner, has to place R LE on floor to steady for power up but has minimal weightbearing. Once in upright pt able to maintain NWB without too much difficulty   Ambulation/Gait Ambulation/Gait assistance: Min assist Gait Distance (Feet): 4 Feet Assistive device: Rolling walker (2 wheeled) Gait Pattern/deviations: Decreased step length - left;Trunk flexed(hop to pattern) Gait velocity: slowed Gait velocity interpretation: <1.31 ft/sec, indicative of household ambulator General Gait Details: minA for steadying with hopping, vc for position in RW to achieve greatest UE support for R LE swing through    [employer]: (unable to attempt due to decreased L LE foot clearance )               Balance Overall balance assessment: Needs assistance Sitting-balance support: No upper extremity supported;Feet supported Sitting balance-Leahy Scale: Fair     Standing balance support: Single extremity supported;During functional activity Standing balance-Leahy Scale: Poor Standing balance comment: requires at least single UE support to maintain balance while pulling pants up                            Cognition Arousal/Alertness: Awake/alert Behavior During Therapy: WFL for tasks assessed/performed Overall Cognitive Status: Within Functional Limits for tasks assessed  General Comments General comments (skin integrity, edema, etc.): Husband present for session, CAM walker in place      Pertinent Vitals/Pain Pain Assessment: 0-10 Pain Score: 9  Pain Location: R ankle Pain Descriptors / Indicators: Throbbing;Aching;Sore Pain  Intervention(s): Limited activity within patient's tolerance;Monitored during session;Repositioned;Patient requesting pain meds-RN notified           PT Goals (current goals can now be found in the care plan section) Acute Rehab PT Goals Patient Stated Goal: go home PT Goal Formulation: With patient Time For Goal Achievement: 03/24/18 Potential to Achieve Goals: Good    Frequency    Min 5X/week      PT Plan Current plan remains appropriate       AM-PAC PT "6 Clicks" Mobility   Outcome Measure  Help needed turning from your back to your side while in a flat bed without using bedrails?: A Little Help needed moving from lying on your back to sitting on the side of a flat bed without using bedrails?: A Little Help needed moving to and from a bed to a chair (including a wheelchair)?: A Little Help needed standing up from a chair using your arms (e.g., wheelchair or bedside chair)?: A Little Help needed to walk in hospital room?: A Lot Help needed climbing 3-5 steps with a railing? : Total 6 Click Score: 15    End of Session Equipment Utilized During Treatment: Gait belt Activity Tolerance: Patient limited by fatigue Patient left: in chair;with call bell/phone within reach;with chair alarm set Nurse Communication: Mobility status;Need for lift equipment;Precautions;Weight bearing status PT Visit Diagnosis: Unsteadiness on feet (R26.81);Other abnormalities of gait and mobility (R26.89);Repeated falls (R29.6);Muscle weakness (generalized) (M62.81);History of falling (Z91.81);Difficulty in walking, not elsewhere classified (R26.2);Other symptoms and signs involving the nervous system (R29.898);Pain Pain - Right/Left: Right Pain - part of body: Ankle and joints of foot     Time: 3735-7897 PT Time Calculation (min) (ACUTE ONLY): 19 min  Charges:  $Gait Training: 8-22 mins                     Maliyah Willets B. Migdalia Dk PT, DPT Acute Rehabilitation Services Pager [phone] Office [phone]    [place] 03/11/2018, 4:04 PM

## 2018-03-11 NOTE — Progress Notes (Signed)
Subjective: 2 Days Post-Op Procedure(s) (LRB): OPEN REDUCTION INTERNAL FIXATION (ORIF) RIGHT LATERAL MALLEOLUS (Right) Patient reports pain as moderate.  Block has worn off, but pain moderately under control with meds.  Happy about d/c home today  Objective: Vital signs in last 24 hours: Temp:  [97.5 F (36.4 C)-98.5 F (36.9 C)] 97.5 F (36.4 C) (01/29 0530) Pulse Rate:  [61-68] 64 (01/29 0530) Resp:  [14-18] 14 (01/29 0530) BP: (138-155)/(57-67) 138/67 (01/29 0530) SpO2:  [98 %-100 %] 98 % (01/29 0530)  Intake/Output from previous day: 01/28 0701 - 01/29 0700 In: 1200 [P.O.:1200] Out: 1300 [Urine:1300] Intake/Output this shift: No intake/output data recorded.  Recent Labs    03/08/18 2141 03/10/18 0311  HGB 11.1* 11.5*   Recent Labs    03/08/18 2141 03/10/18 0311  WBC 3.5* 3.0*  RBC 3.55* 3.81*  HCT 36.0 37.0  PLT 67* 62*   Recent Labs    03/08/18 2141 03/10/18 0311  NA 141 141  K 3.7 4.1  CL 109 109  CO2 24 22  BUN 22 14  CREATININE 0.84 1.00  GLUCOSE 113* 225*  CALCIUM 8.8* 8.8*   Recent Labs    03/08/18 2141  INR 1.20    Neurologically intact Neurovascular intact Sensation intact distally Intact pulses distally Dorsiflexion/Plantar flexion intact Incision: dressing C/D/I No cellulitis present Compartment soft    Assessment/Plan: 2 Days Post-Op Procedure(s) (LRB): OPEN REDUCTION INTERNAL FIXATION (ORIF) RIGHT LATERAL MALLEOLUS (Right) Up with therapy  NWB RLE Continue cam walker Come out of cam walker to work on ankle ROM as tolerated Ice and elevate for pain and swelling Patient with recent hx of banding for esophageal varicies with repeat banding within next few weeks.  Will defer dvt ppx to medicine team Ok from ortho standpoint to d/c home today F/u with Dr. Erlinda Hong 2 weeks post-op    Olivia Benson 03/11/2018, 7:01 AM

## 2018-03-15 ENCOUNTER — Other Ambulatory Visit: Payer: Self-pay | Admitting: Neurology

## 2018-03-23 ENCOUNTER — Encounter (HOSPITAL_COMMUNITY): Payer: Self-pay | Admitting: Registered Nurse

## 2018-03-23 ENCOUNTER — Ambulatory Visit (HOSPITAL_COMMUNITY): Payer: Medicare Other | Admitting: Registered Nurse

## 2018-03-23 ENCOUNTER — Other Ambulatory Visit: Payer: Self-pay

## 2018-03-23 ENCOUNTER — Encounter (HOSPITAL_COMMUNITY)
Admission: RE | Disposition: A | Discharge status: Home / Self Care | Payer: Self-pay | Source: Home / Self Care | Attending: Gastroenterology

## 2018-03-23 ENCOUNTER — Ambulatory Visit (HOSPITAL_COMMUNITY)
Admission: RE | Admit: 2018-03-23 | Discharge: 2018-03-23 | Disposition: A | Discharge status: Home / Self Care | Payer: Medicare Other | Attending: Gastroenterology | Admitting: Gastroenterology

## 2018-03-23 DIAGNOSIS — N3281 Overactive bladder: Secondary | ICD-10-CM | POA: Insufficient documentation

## 2018-03-23 DIAGNOSIS — I10 Essential (primary) hypertension: Secondary | ICD-10-CM | POA: Insufficient documentation

## 2018-03-23 DIAGNOSIS — K7460 Unspecified cirrhosis of liver: Secondary | ICD-10-CM | POA: Diagnosis not present

## 2018-03-23 DIAGNOSIS — G2581 Restless legs syndrome: Secondary | ICD-10-CM | POA: Diagnosis not present

## 2018-03-23 DIAGNOSIS — Z87891 Personal history of nicotine dependence: Secondary | ICD-10-CM | POA: Diagnosis not present

## 2018-03-23 DIAGNOSIS — K766 Portal hypertension: Secondary | ICD-10-CM | POA: Insufficient documentation

## 2018-03-23 DIAGNOSIS — I2510 Atherosclerotic heart disease of native coronary artery without angina pectoris: Secondary | ICD-10-CM | POA: Diagnosis not present

## 2018-03-23 DIAGNOSIS — Z6833 Body mass index (BMI) 33.0-33.9, adult: Secondary | ICD-10-CM | POA: Diagnosis not present

## 2018-03-23 DIAGNOSIS — E669 Obesity, unspecified: Secondary | ICD-10-CM | POA: Diagnosis not present

## 2018-03-23 DIAGNOSIS — I8500 Esophageal varices without bleeding: Secondary | ICD-10-CM | POA: Diagnosis present

## 2018-03-23 DIAGNOSIS — Z79899 Other long term (current) drug therapy: Secondary | ICD-10-CM | POA: Insufficient documentation

## 2018-03-23 DIAGNOSIS — Z8543 Personal history of malignant neoplasm of ovary: Secondary | ICD-10-CM | POA: Diagnosis not present

## 2018-03-23 DIAGNOSIS — E119 Type 2 diabetes mellitus without complications: Secondary | ICD-10-CM | POA: Diagnosis not present

## 2018-03-23 DIAGNOSIS — K3189 Other diseases of stomach and duodenum: Secondary | ICD-10-CM | POA: Diagnosis not present

## 2018-03-23 DIAGNOSIS — J45909 Unspecified asthma, uncomplicated: Secondary | ICD-10-CM | POA: Diagnosis not present

## 2018-03-23 DIAGNOSIS — C786 Secondary malignant neoplasm of retroperitoneum and peritoneum: Secondary | ICD-10-CM | POA: Diagnosis not present

## 2018-03-23 DIAGNOSIS — I252 Old myocardial infarction: Secondary | ICD-10-CM | POA: Diagnosis not present

## 2018-03-23 DIAGNOSIS — Z794 Long term (current) use of insulin: Secondary | ICD-10-CM | POA: Insufficient documentation

## 2018-03-23 DIAGNOSIS — K219 Gastro-esophageal reflux disease without esophagitis: Secondary | ICD-10-CM | POA: Diagnosis not present

## 2018-03-23 DIAGNOSIS — Z85828 Personal history of other malignant neoplasm of skin: Secondary | ICD-10-CM | POA: Diagnosis not present

## 2018-03-23 HISTORY — PX: ESOPHAGEAL BANDING: SHX5518

## 2018-03-23 HISTORY — PX: ESOPHAGOGASTRODUODENOSCOPY: SHX5428

## 2018-03-23 LAB — GLUCOSE, CAPILLARY: Glucose-Capillary: 125 mg/dL — ABNORMAL HIGH (ref 70–99)

## 2018-03-23 SURGERY — EGD (ESOPHAGOGASTRODUODENOSCOPY)
Anesthesia: Monitor Anesthesia Care

## 2018-03-23 MED ORDER — ONDANSETRON HCL 4 MG/2ML IJ SOLN
INTRAMUSCULAR | Status: DC | PRN
  Administered 2018-03-23: 4 mg via INTRAVENOUS

## 2018-03-23 MED ORDER — PROPOFOL 10 MG/ML IV BOLUS
INTRAVENOUS | Status: AC
  Filled 2018-03-23: qty 20

## 2018-03-23 MED ORDER — HYDROCHLOROTHIAZIDE 12.5 MG PO CAPS: 12.5000 mg | ORAL_CAPSULE | Freq: Every day | ORAL | Status: DC

## 2018-03-23 MED ORDER — NADOLOL 20 MG PO TABS: 20.0000 mg | ORAL_TABLET | Freq: Every day | ORAL | 3 refills | Status: DC

## 2018-03-23 MED ORDER — FENTANYL CITRATE (PF) 100 MCG/2ML IJ SOLN
INTRAMUSCULAR | Status: AC
  Filled 2018-03-23: qty 2

## 2018-03-23 MED ORDER — INSULIN DEGLUDEC 100 UNIT/ML ~~LOC~~ SOPN: 18.0000 [IU] | PEN_INJECTOR | Freq: Every day | SUBCUTANEOUS | Status: DC

## 2018-03-23 MED ORDER — SODIUM CHLORIDE 0.9 % IV SOLN: INTRAVENOUS | Status: DC

## 2018-03-23 MED ORDER — SENNOSIDES-DOCUSATE SODIUM 8.6-50 MG PO TABS: 1.0000 | ORAL_TABLET | Freq: Every evening | ORAL | Status: DC | PRN

## 2018-03-23 MED ORDER — PROPOFOL 10 MG/ML IV BOLUS
INTRAVENOUS | Status: AC
  Filled 2018-03-23: qty 40

## 2018-03-23 MED ORDER — HYDROMORPHONE HCL 1 MG/ML IJ SOLN
INTRAMUSCULAR | Status: AC
  Filled 2018-03-23: qty 1

## 2018-03-23 MED ORDER — PROPOFOL 500 MG/50ML IV EMUL
INTRAVENOUS | Status: DC | PRN
  Administered 2018-03-23: 250 ug/kg/min via INTRAVENOUS

## 2018-03-23 MED ORDER — HYDROCODONE-ACETAMINOPHEN 5-325 MG PO TABS: 1.0000 | ORAL_TABLET | Freq: Four times a day (QID) | ORAL | Status: DC | PRN

## 2018-03-23 MED ORDER — VITAMIN D 1000 UNITS PO TABS: 1000.0000 [IU] | ORAL_TABLET | Freq: Every day | ORAL | Status: DC

## 2018-03-23 MED ORDER — DM-GUAIFENESIN ER 30-600 MG PO TB12: 1.0000 | ORAL_TABLET | Freq: Two times a day (BID) | ORAL | Status: DC | PRN

## 2018-03-23 MED ORDER — ROTIGOTINE 4 MG/24HR TD PT24: 1.0000 | MEDICATED_PATCH | Freq: Every day | TRANSDERMAL | Status: DC

## 2018-03-23 MED ORDER — GABAPENTIN 300 MG PO CAPS: 600.0000 mg | ORAL_CAPSULE | Freq: Two times a day (BID) | ORAL | Status: DC

## 2018-03-23 MED ORDER — LIDOCAINE-PRILOCAINE 2.5-2.5 % EX CREA: TOPICAL_CREAM | Freq: Once | CUTANEOUS | Status: DC

## 2018-03-23 MED ORDER — METHOCARBAMOL 500 MG PO TABS: 500.0000 mg | ORAL_TABLET | Freq: Four times a day (QID) | ORAL | Status: DC | PRN

## 2018-03-23 MED ORDER — LACTATED RINGERS IV SOLN
INTRAVENOUS | Status: DC
  Administered 2018-03-23: 09:00:00 via INTRAVENOUS

## 2018-03-23 MED ORDER — FENTANYL CITRATE (PF) 100 MCG/2ML IJ SOLN
INTRAMUSCULAR | Status: DC | PRN
  Administered 2018-03-23 (×2): 50 ug via INTRAVENOUS
  Administered 2018-03-23: 25 ug via INTRAVENOUS
  Administered 2018-03-23: 50 ug via INTRAVENOUS
  Administered 2018-03-23: 25 ug via INTRAVENOUS

## 2018-03-23 MED ORDER — PANTOPRAZOLE SODIUM 40 MG PO TBEC: 40.0000 mg | DELAYED_RELEASE_TABLET | Freq: Two times a day (BID) | ORAL | Status: DC

## 2018-03-23 MED ORDER — HEPARIN SOD (PORK) LOCK FLUSH 100 UNIT/ML IV SOLN
500.0000 [IU] | INTRAVENOUS | Status: AC | PRN
  Administered 2018-03-23: 500 [IU]

## 2018-03-23 MED ORDER — HYDROMORPHONE HCL 1 MG/ML IJ SOLN
0.5000 mg | Freq: Once | INTRAMUSCULAR | Status: AC
  Administered 2018-03-23: 0.5 mg via INTRAVENOUS

## 2018-03-23 MED ORDER — PROPRANOLOL HCL 20 MG PO TABS: 20.0000 mg | ORAL_TABLET | Freq: Two times a day (BID) | ORAL | Status: DC

## 2018-03-23 MED ORDER — OXYCODONE HCL 5 MG PO TABS
5.0000 mg | ORAL_TABLET | Freq: Once | ORAL | Status: AC
  Administered 2018-03-23: 5 mg via ORAL
  Filled 2018-03-23: qty 1

## 2018-03-23 MED ORDER — ONDANSETRON HCL 8 MG PO TABS: 8.0000 mg | ORAL_TABLET | Freq: Three times a day (TID) | ORAL | Status: DC | PRN

## 2018-03-23 MED ORDER — METOCLOPRAMIDE HCL 5 MG/ML IJ SOLN: 10.0000 mg | Freq: Once | INTRAMUSCULAR | Status: DC | PRN

## 2018-03-23 MED ORDER — DOCUSATE SODIUM 100 MG PO CAPS: 100.0000 mg | ORAL_CAPSULE | Freq: Two times a day (BID) | ORAL | Status: DC

## 2018-03-23 NOTE — Anesthesia Preprocedure Evaluation (Addendum)
Anesthesia Evaluation  Patient identified by MRN, date of birth, ID band Patient awake    Reviewed: Allergy & Precautions, NPO status , Patient's Chart, lab work & pertinent test results, reviewed documented beta blocker date and time   History of Anesthesia Complications (+) PONV, PROLONGED EMERGENCE, Family history of anesthesia reaction and history of anesthetic complications  Airway Mallampati: II  TM Distance: >3 FB Neck ROM: Full    Dental no notable dental hx. (+) Dental Advisory Given, Teeth Intact   Pulmonary shortness of breath (since starting chemotherapy) and with exertion, asthma , former smoker,    breath sounds clear to auscultation       Cardiovascular hypertension, Pt. on medications and Pt. on home beta blockers (-) angina+ Past MI and +CHF   Rhythm:Regular Rate:Normal     Neuro/Psych Chemo induced peripheral neuropathy RLS   Neuromuscular disease negative psych ROS   GI/Hepatic GERD  Medicated and Controlled,(+) Cirrhosis   Esophageal Varices    , Hepatitis -, UnspecifiedPortal HTN with splenoomegaly Primary peritoneal carcinomatosis- S/P chemoRx   Endo/Other  diabetes, Well Controlled, Type 2, Insulin Dependent, Oral Hypoglycemic Agents Obesity   Renal/GU negative Renal ROS    OAB  negative genitourinary   Musculoskeletal negative musculoskeletal ROS (+)   Abdominal   Peds  Hematology  (+) anemia , Thrombocytopenia    Anesthesia Other Findings   Reproductive/Obstetrics Ovarian cancer with peritoneal carcinomatosis- last ChemoRx 4 weeks ago                             Anesthesia Physical  Anesthesia Plan  ASA: III  Anesthesia Plan: MAC   Post-op Pain Management:    Induction: Intravenous  PONV Risk Score and Plan: 3 and Propofol infusion, Treatment may vary due to age or medical condition and Ondansetron  Airway Management Planned: Nasal Cannula and  Natural Airway  Additional Equipment: None  Intra-op Plan:   Post-operative Plan:   Informed Consent: I have reviewed the patients History and Physical, chart, labs and discussed the procedure including the risks, benefits and alternatives for the proposed anesthesia with the patient or authorized representative who has indicated his/her understanding and acceptance.     Dental advisory given  Plan Discussed with: CRNA and Anesthesiologist  Anesthesia Plan Comments:         Anesthesia Quick Evaluation

## 2018-03-23 NOTE — Brief Op Note (Signed)
03/23/2018  9:14 AM  PATIENT:  Olivia Benson  73 y.o. female  PRE-OPERATIVE DIAGNOSIS:  Cirrhosis, esophageal varices  POST-OPERATIVE DIAGNOSIS:  esophageal varices banding  PROCEDURE:  Procedure(s): ESOPHAGOGASTRODUODENOSCOPY (EGD) (N/A) ESOPHAGEAL BANDING (N/A)  SURGEON:  Surgeon(s) and Role:    Ronnette Juniper, MD - Primary  PHYSICIAN ASSISTANT:   ASSISTANTS: Lilli Few, Tinnie Gens, Tech  ANESTHESIA:   MAC  EBL:  none   BLOOD ADMINISTERED:none  DRAINS: none   LOCAL MEDICATIONS USED:  NONE  SPECIMEN:  No Specimen  DISPOSITION OF SPECIMEN:  N/A  COUNTS:  YES  TOURNIQUET:  * No tourniquets in log *  DICTATION: .Dragon Dictation  PLAN OF CARE: Discharge to home after PACU  PATIENT DISPOSITION:  PACU - hemodynamically stable.   Delay start of Pharmacological VTE agent (>24hrs) due to surgical blood loss or risk of bleeding: no

## 2018-03-23 NOTE — Anesthesia Postprocedure Evaluation (Signed)
Anesthesia Post Note  Patient: Olivia Benson  Procedure(s) Performed: ESOPHAGOGASTRODUODENOSCOPY (EGD) (N/A ) ESOPHAGEAL BANDING (N/A )     Patient location during evaluation: PACU Anesthesia Type: MAC Level of consciousness: awake and alert and oriented Pain management: pain level controlled Vital Signs Assessment: post-procedure vital signs reviewed and stable Respiratory status: spontaneous breathing, nonlabored ventilation and respiratory function stable Cardiovascular status: stable and blood pressure returned to baseline Postop Assessment: no apparent nausea or vomiting Anesthetic complications: no    Last Vitals:  Vitals:   03/23/18 0923  BP: (!) 193/86  Pulse: 99  Resp: 15  SpO2: 100%    Last Pain:  Vitals:   03/23/18 0923  PainSc: 8                  Terika Pillard A.

## 2018-03-23 NOTE — H&P (Addendum)
Olivia Benson is an 73 y.o. female.     Chief Complaint: esophageal varices  HPI: 73 year old female with history of  Hypertension, diabetes, asthma, NASH,cirrhosis, portal hypertension, coronary artery disease, non-STEMI, disseminated ovarian cancer with , peritoneal carcinomatosis, thrombocytopenia and obesity presents as an outpatient for EGD and variceal banding. Last EGD was performed in 12/19, grade 3 varices noted and 7 bands were deployed.  Past Medical History:  Diagnosis Date  . Asthma   . Cirrhosis ([place])   . Complication of anesthesia    slow to wake up / n & v  . Diabetes mellitus   . Elevated LFTs   . Family history of adverse reaction to anesthesia    multiple family members have had problems such as slow to wake / "flat lined" /  ventilator    . Frequency of urination   . Glaucoma, narrow-angle   . History of skin cancer   . Hypertension   . Non-ST elevated myocardial infarction (non-STEMI) [facility]) Sept 2012   Mild elevation in troponin and CK MB but no evidence of coronary disease at time of cath  . Obesity   . Ovarian cancer ([place]) 11/22/2013   Disseminated ovarian cancer  . Overactive bladder   . PONV (postoperative nausea and vomiting)   . Portal hypertension ([place])   . Restless leg syndrome   . Tingling    hands and feet    Past Surgical History:  Procedure Laterality Date  . ABDOMINAL ULTRASOUND  Sept 2012   No gallbladder disease, + fatty liver  . CARDIAC CATHETERIZATION  10/25/2010   EF 60%; Normal coronaries  . COLONOSCOPY  10/18/2005  . COSMETIC SURGERY  2003  . DENTAL SURGERY    . ESOPHAGEAL BANDING  02/10/2018   Procedure: ESOPHAGEAL BANDING;  Surgeon: Ronnette Juniper, MD;  [facility];  Service: Gastroenterology;;  . ESOPHAGOGASTRODUODENOSCOPY (EGD) WITH PROPOFOL N/A 02/10/2018   Procedure: ESOPHAGOGASTRODUODENOSCOPY (EGD) WITH PROPOFOL;  Surgeon: Ronnette Juniper, MD;  [facility];  Service: Gastroenterology;  Laterality: N/A;  . EYE  SURGERY Bilateral 09/19/14 R eye, 08/2014 L eye   open angles in both eyes d/t narrow angle glaucoma  . LAPAROTOMY N/A 03/29/2014   Procedure: EXPLORATORY LAPAROTOMY;  Surgeon: Everitt Amber, MD;  [facility];  Service: Gynecology;  Laterality: N/A;  . ORIF ANKLE FRACTURE Right 03/09/2018   Procedure: OPEN REDUCTION INTERNAL FIXATION (ORIF) RIGHT LATERAL MALLEOLUS;  Surgeon: Leandrew Koyanagi, MD;  Location: [facility];  Service: Orthopedics;  Laterality: Right;  . PARTIAL HYSTERECTOMY  1988  . SALPINGOOPHORECTOMY Bilateral 03/29/2014   Procedure: BILATERAL SALPINGO OOPHORECTOMY, OMENTECTOMY, DEBULKING;  Surgeon: Everitt Amber, MD;  [facility];  Service: Gynecology;  Laterality: Bilateral;  . TONSILLECTOMY  1971  . TUBAL LIGATION    . [facility]  11/24/2009    Family History  Problem Relation Age of Onset  . Asthma Mother   . Diabetes Mother   . Hypertension Mother   . Allergies Mother   . Rheum arthritis Mother   . Bladder Cancer Father 8       former heavy smoker  . Hypertension Father   . Stroke Father   . Breast cancer Sister 80  . Diabetes Sister        x 3  . Allergies Sister   . Asthma Sister        x 2   . Breast cancer Maternal Aunt 85  . Breast cancer Cousin 52       (maternal)  bilateral cancer  . Aneurysm Son 33  . Diabetes Brother   . Allergies Brother   . Asthma Brother    Social History:  reports that she quit smoking about 36 years ago. Her smoking use included cigarettes. She has a 1.00 pack-year smoking history. She has never used smokeless tobacco. She reports that she does not drink alcohol or use drugs.  Allergies:  Allergies  Allergen Reactions  . Shellfish Allergy Anaphylaxis    HER THROAT SWELLS UP AND CLOSES  . Benadryl [Diphenhydramine Hcl] Other (See Comments)    Restless legs   . Penicillins Rash    DID THE REACTION INVOLVE: Swelling of the face/tongue/throat, SOB, or low BP? No Sudden or severe rash/hives, skin peeling, or the inside of the  mouth or nose? No Did it require medical treatment? No When did it last happen?Childhood allergy If all above answers are "NO", may proceed with cephalosporin use.   . Tape Itching    Red itch with some tapes    Medications Prior to Admission  Medication Sig Dispense Refill  . cholecalciferol (VITAMIN D) 1000 units tablet Take 1,000 Units by mouth at bedtime.     . docusate sodium (COLACE) 100 MG capsule Take 1 capsule (100 mg total) by mouth 2 (two) times daily. 10 capsule 0  . gabapentin (NEURONTIN) 300 MG capsule Take 2 capsules (600 mg total) by mouth 2 (two) times daily. 120 capsule 11  . hydrochlorothiazide (MICROZIDE) 12.5 MG capsule Take 12.5 mg by mouth daily after breakfast.   1  . HYDROcodone-acetaminophen (NORCO/VICODIN) 5-325 MG tablet take 1 tablet by mouth every 6 hours if needed for MODERATE PAIN (Patient taking differently: Take 1 tablet by mouth every 6 (six) hours as needed for moderate pain or severe pain. ) 60 tablet 0  . insulin degludec (TRESIBA FLEXTOUCH) 100 UNIT/ML SOPN FlexTouch Pen Inject 18 Units into the skin daily after breakfast.     . lidocaine-prilocaine (EMLA) cream Apply to Porta-cath  1-2 hours prior to access as directed. 30 g 9  . methocarbamol (ROBAXIN) 500 MG tablet Take 1 tablet (500 mg total) by mouth every 6 (six) hours as needed for muscle spasms. 20 tablet 0  . NEUPRO 4 MG/24HR APPLY 1 PATCH EXTERNALLY TO THE SKIN DAILY 30 patch 5  . ondansetron (ZOFRAN) 8 MG tablet Take 1 tablet (8 mg total) by mouth every 8 (eight) hours as needed for nausea. 30 tablet 3  . pantoprazole (PROTONIX) 40 MG tablet Take 1 tablet (40 mg total) by mouth 2 (two) times daily. 60 tablet 0  . propranolol (INDERAL) 20 MG tablet Take 1 tablet (20 mg total) by mouth 2 (two) times daily. 60 tablet 0  . dextromethorphan-guaiFENesin (MUCINEX DM) 30-600 MG 12hr tablet Take 1 tablet by mouth 2 (two) times daily as needed for cough. 10 tablet 0  . senna-docusate (SENOKOT-S)  8.6-50 MG tablet Take 1 tablet by mouth at bedtime as needed for mild constipation. 30 tablet 0    No results found for this or any previous visit (from the past 48 hour(s)). No results found.  Review of Systems  Constitutional: Negative.   HENT: Negative.   Eyes: Negative.   Respiratory: Negative.   Cardiovascular: Negative.   Gastrointestinal: Negative.   Genitourinary: Negative.   Musculoskeletal: Positive for falls and joint pain.  Skin: Negative.   Neurological: Negative.   Psychiatric/Behavioral: Negative.     There were no vitals taken for this visit. Physical Exam  Constitutional: She  is oriented to person, place, and time. She appears well-developed and well-nourished.  HENT:  Head: Normocephalic and atraumatic.  Eyes: Conjunctivae are normal.  Neck: Normal range of motion. Neck supple.  Cardiovascular: Normal rate and regular rhythm.  Respiratory: Effort normal and breath sounds normal.  GI: Soft. Bowel sounds are normal.  Neurological: She is alert and oriented to person, place, and time.  Skin: Skin is warm and dry.  Psychiatric: She has a normal mood and affect.     Assessment/Plan Decompensated cirrhosis with esophageal varices EGD with banding today  Ronnette Juniper, MD 03/23/2018, 8:12 AM

## 2018-03-23 NOTE — Discharge Instructions (Signed)
YOU HAD AN ENDOSCOPIC PROCEDURE TODAY: Refer to the procedure report and other information in the discharge instructions given to you for any specific questions about what was found during the examination. If this information does not answer your questions, please call [facility] office at [phone] to clarify.   YOU SHOULD EXPECT: Some feelings of bloating in the abdomen. Passage of more gas than usual. Walking can help get rid of the air that was put into your GI tract during the procedure and reduce the bloating. If you had a lower endoscopy (such as a colonoscopy or flexible sigmoidoscopy) you may notice spotting of blood in your stool or on the toilet paper. Some abdominal soreness may be present for a day or two, also.  DIET: Clear liquid diet for 4 hours Your first meal following the procedure should be a light meal and then it is ok to progress to your normal diet. A half-sandwich or bowl of soup is an example of a good first meal. Heavy or fried foods are harder to digest and may make you feel nauseous or bloated. Drink plenty of fluids but you should avoid alcoholic beverages for 24 hours. If you had a esophageal dilation, please see attached instructions for diet.   ACTIVITY: Your care partner should take you home directly after the procedure. You should plan to take it easy, moving slowly for the rest of the day. You can resume normal activity the day after the procedure however YOU SHOULD NOT DRIVE, use power tools, machinery or perform tasks that involve climbing or major physical exertion for 24 hours (because of the sedation medicines used during the test).   SYMPTOMS TO REPORT IMMEDIATELY: A gastroenterologist can be reached at any hour. Please call [phone]  for any of the following symptoms:   Following lower endoscopy (colonoscopy, flexible sigmoidoscopy) Excessive amounts of blood in the stool  Significant tenderness, worsening of abdominal pains  Swelling of the abdomen that  is new, acute  Fever of 100 or higher   Following upper endoscopy (EGD, EUS, ERCP, esophageal dilation) Vomiting of blood or coffee ground material  New, significant abdominal pain  New, significant chest pain or pain under the shoulder blades  Painful or persistently difficult swallowing  New shortness of breath  Black, tarry-looking or red, bloody stools  FOLLOW UP:  If any biopsies were taken you will be contacted by phone or by letter within the next 1-3 weeks. Call [phone]  if you have not heard about the biopsies in 3 weeks.  Please also call with any specific questions about appointments or follow up tests.

## 2018-03-23 NOTE — Anesthesia Procedure Notes (Signed)
Procedure Name: MAC Date/Time: 03/23/2018 8:58 AM Performed by: Lissa Morales, CRNA Pre-anesthesia Checklist: Patient identified, Emergency Drugs available, Suction available, Patient being monitored and Timeout performed Patient Re-evaluated:Patient Re-evaluated prior to induction Oxygen Delivery Method: Nasal cannula Placement Confirmation: positive ETCO2

## 2018-03-23 NOTE — Transfer of Care (Signed)
Immediate Anesthesia Transfer of Care Note  Patient: Olivia Benson  Procedure(s) Performed: ESOPHAGOGASTRODUODENOSCOPY (EGD) (N/A ) ESOPHAGEAL BANDING (N/A )  Patient Location: PACU  Anesthesia Type:MAC  Level of Consciousness: awake, alert , oriented and patient cooperative  Airway & Oxygen Therapy: Patient Spontanous Breathing and Patient connected to nasal cannula oxygen  Post-op Assessment: Report given to RN, Post -op Vital signs reviewed and stable and Patient moving all extremities X 4  Post vital signs: stable  Last Vitals:  Vitals Value Taken Time  BP 209/84 03/23/2018  9:30 AM  Temp    Pulse 83 03/23/2018  9:32 AM  Resp 10 03/23/2018  9:32 AM  SpO2 100 % 03/23/2018  9:32 AM  Vitals shown include unvalidated device data.  Last Pain:  Vitals:   03/23/18 0923  PainSc: 8          Complications: No apparent anesthesia complications

## 2018-03-23 NOTE — Op Note (Signed)
[facility] Patient Name: Olivia Benson Procedure Date: 03/23/2018 MRN: [id number] Attending MD: Ronnette Juniper , MD Date of Birth: 1945-11-21 CSN: [id number] Age: 73 Admit Type: Outpatient Procedure:                Upper GI endoscopy Indications:              For therapy of esophageal varices Providers:                Ronnette Juniper, MD, Cleda Daub, RN, Tinnie Gens,                            Technician, Enrigue Catena, CRNA Referring MD:              Medicines:                Monitored Anesthesia Care Complications:            No immediate complications. Estimated blood loss:                            None. Estimated Blood Loss:     Estimated blood loss: none. Procedure:                Pre-Anesthesia Assessment:                           - Prior to the procedure, a History and Physical                            was performed, and patient medications and                            allergies were reviewed. The patient's tolerance of                            previous anesthesia was also reviewed. The risks                            and benefits of the procedure and the sedation                            options and risks were discussed with the patient.                            All questions were answered, and informed consent                            was obtained. Prior Anticoagulants: The patient has                            taken no previous anticoagulant or antiplatelet                            agents. ASA Grade Assessment: III - A patient with  severe systemic disease. After reviewing the risks                            and benefits, the patient was deemed in                            satisfactory condition to undergo the procedure.                           After obtaining informed consent, the endoscope was                            passed under direct vision. Throughout the                            procedure, the patient's  blood pressure, pulse, and                            oxygen saturations were monitored continuously. The                            GIF-H190 ([id number]) Olympus gastroscope was                            introduced through the mouth, and advanced to the                            second part of duodenum. The upper GI endoscopy was                            accomplished without difficulty. The patient                            tolerated the procedure well. Scope In: Scope Out: Findings:      Grade III, large (> 5 mm) varices were found in the middle third of the       esophagus and in the lower third of the esophagus. Seven bands were       successfully placed with complete eradication, resulting in deflation of       varices. There was no bleeding during and at the end of the procedure.      The Z-line was regular and was found 35 cm from the incisors.      Possible small varices with no bleeding were found in the gastric       fundus. There were no stigmata of recent bleeding.      Mild portal hypertensive gastropathy was found in the gastric body.      Localized mildly erythematous mucosa without bleeding was found in the       gastric antrum.      The examined duodenum was normal. Impression:               - Grade III and large (> 5 mm) esophageal varices.                            Completely eradicated.  Banded.                           - Z-line regular, 35 cm from the incisors.                           - Possible gastric varices, without bleeding.                           - Portal hypertensive gastropathy.                           - Erythematous mucosa in the antrum.                           - Normal examined duodenum.                           - No specimens collected. Moderate Sedation:      Patient did not receive moderate sedation for this procedure, but       instead received monitored anesthesia care. Recommendation:           - Patient has a contact number available  for                            emergencies. The signs and symptoms of potential                            delayed complications were discussed with the                            patient. Return to normal activities tomorrow.                            Written discharge instructions were provided to the                            patient.                           - Clear liquid diet for 4 hours.                           - Give a beta blocker with dosage titrated by the                            heart rate.                           - Repeat upper endoscopy in 6 weeks for retreatment. Procedure Code(s):        --- Professional ---                           [phone], Esophagogastroduodenoscopy, flexible,  transoral; with band ligation of esophageal/gastric                            varices Diagnosis Code(s):        --- Professional ---                           I85.00, Esophageal varices without bleeding                           I86.4, Gastric varices                           K76.6, Portal hypertension                           K31.89, Other diseases of stomach and duodenum CPT copyright 2018 American [employer] Association. All rights reserved. The codes documented in this report are preliminary and upon coder review may  be revised to meet current compliance requirements. Ronnette Juniper, MD 03/23/2018 9:18:34 AM This report has been signed electronically. Number of Addenda: 0

## 2018-03-24 ENCOUNTER — Ambulatory Visit (INDEPENDENT_AMBULATORY_CARE_PROVIDER_SITE_OTHER): Payer: Medicare Other | Admitting: Orthopaedic Surgery

## 2018-03-24 ENCOUNTER — Ambulatory Visit (INDEPENDENT_AMBULATORY_CARE_PROVIDER_SITE_OTHER): Payer: Medicare Other

## 2018-03-24 ENCOUNTER — Encounter (INDEPENDENT_AMBULATORY_CARE_PROVIDER_SITE_OTHER): Payer: Self-pay | Admitting: Orthopaedic Surgery

## 2018-03-24 VITALS — Ht 62.0 in | Wt 177.0 lb

## 2018-03-24 DIAGNOSIS — M25571 Pain in right ankle and joints of right foot: Secondary | ICD-10-CM

## 2018-03-24 NOTE — Progress Notes (Signed)
Post-Op Visit Note   Patient: Olivia Benson           Date of Birth: [birth year]           MRN: [id number] Visit Date: 03/24/2018 PCP: Helane Rima, MD   Assessment & Plan:  Chief Complaint:  Chief Complaint  Patient presents with  . Right Ankle - Routine Post Op, Pain   Visit Diagnoses:  1. Pain in right ankle and joints of right foot     Plan: Olivia Benson is two-week status post ORIF right lateral malleolus fracture.  She is overall doing well.  She is receiving home health physical therapy for ankle range of motion.  She denies any significant pain.  Her surgical incision is healed without any evidence of infection.  Swelling is significantly improved.  We remove the sutures today and placed Steri-Strips.  Continue nonweightbearing for 2 more weeks.  In the meantime she is to continue with ankle range of motion.  We will see her back in 2 weeks with three-view x-rays right ankle.  Anticipate allow her to weight-bear in a cam walker at that time.  Follow-Up Instructions: Return in about 2 weeks (around 04/07/2018).   Orders:  Orders Placed This Encounter  Procedures  . XR Ankle Complete Right   No orders of the defined types were placed in this encounter.   Imaging: Xr Ankle Complete Right  Result Date: 03/24/2018 Stable fixation and alignment of lateral malleolus fracture without complication.   PMFS History: Patient Active Problem List   Diagnosis Date Noted  . Chronic diastolic CHF (congestive heart failure) ([place]) 03/09/2018  . Fall 03/08/2018  . Displaced fracture of distal end of right fibula 03/08/2018  . GIB (gastrointestinal bleeding) 02/09/2018  . Obesity due to excess calories 03/20/2017  . Cardiomyopathy ([place]) 01/24/2017  . Sinus congestion 01/24/2017  . 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 Chemotherapy-induced nausea 10/24/2016  . Goals of care, counseling/discussion 09/03/2016  . Thrombocytopenia ([place]) 04/15/2016  . Hot flashes due to  tamoxifen 04/15/2016  . Vasomotor instability 01/17/2016  . Osteopenia determined by x-ray 12/22/2015  . Encounter for antineoplastic chemotherapy 12/09/2015  . Chemotherapy-induced neuropathy ([place]) 10/01/2015  . Elevated tumor markers 10/01/2015  . Obesity (BMI 35.0-39.9 without comorbidity) 05/31/2015  . Port catheter in place 03/29/2015  . Iron deficiency anemia 03/05/2015  . Low back pain 03/05/2015  . Neuropathic pain of foot 11/21/2014  . Diabetes mellitus without complication ([place]) 47/42/5956  . Portacath in place 05/10/2014  . Leukopenia due to antineoplastic chemotherapy ([place]) 05/10/2014  . Splenomegaly 05/10/2014  . Primary peritoneal carcinomatosis ([place]) 05/08/2014  . Genetic testing 03/16/2014  . Neutropenic fever ([place]) 01/27/2014  . Pancytopenia, acquired ([place]) 01/27/2014  . Fever and neutropenia ([place]) 01/27/2014  . Asthma 01/27/2014  . Malignant ascites 01/25/2014  . Chemotherapy induced neutropenia ([place]) 01/25/2014  . Chemotherapy induced thrombocytopenia 01/25/2014  . Restless legs syndrome 01/25/2014  . Liver cirrhosis secondary to NASH ([place]) 01/02/2014  . Disseminated ovarian cancer ([place]) 12/03/2013  . 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 Ascites 11/15/2013  . Elevated cancer antigen 125 (CA-125) 11/15/2013  . Cough variant asthma vs UACS 09/02/2013  . Essential hypertension 09/02/2013  . Dyspnea 10/22/2012  . Fatty liver disease, nonalcoholic 38/75/6433  . S/P cardiac cath 11/06/2010   Past Medical History:  Diagnosis Date  . Asthma   . Cirrhosis ([place])   . Complication of anesthesia    slow to wake up / n &  v  . Diabetes mellitus   . Elevated LFTs   . Family history of adverse reaction to anesthesia    multiple family members have had problems such as slow to wake / "flat lined" /  ventilator    . Frequency of urination   . Glaucoma, narrow-angle   . History of skin cancer   . Hypertension   . Non-ST  elevated myocardial infarction (non-STEMI) [facility]) Sept 2012   Mild elevation in troponin and CK MB but no evidence of coronary disease at time of cath  . Obesity   . Ovarian cancer ([place]) 11/22/2013   Disseminated ovarian cancer  . Overactive bladder   . PONV (postoperative nausea and vomiting)   . Portal hypertension ([place])   . Restless leg syndrome   . Tingling    hands and feet    Family History  Problem Relation Age of Onset  . Asthma Mother   . Diabetes Mother   . Hypertension Mother   . Allergies Mother   . Rheum arthritis Mother   . Bladder Cancer Father 62       former heavy smoker  . Hypertension Father   . Stroke Father   . Breast cancer Sister 3  . Diabetes Sister        x 3  . Allergies Sister   . Asthma Sister        x 2   . Breast cancer Maternal Aunt 85  . Breast cancer Cousin 11       (maternal) bilateral cancer  . Aneurysm Son 107  . Diabetes Brother   . Allergies Brother   . Asthma Brother     Past Surgical History:  Procedure Laterality Date  . ABDOMINAL ULTRASOUND  Sept 2012   No gallbladder disease, + fatty liver  . CARDIAC CATHETERIZATION  10/25/2010   EF 60%; Normal coronaries  . COLONOSCOPY  10/18/2005  . COSMETIC SURGERY  2003  . DENTAL SURGERY    . ESOPHAGEAL BANDING  02/10/2018   Procedure: ESOPHAGEAL BANDING;  Surgeon: Ronnette Juniper, MD;  Location: [facility];  Service: Gastroenterology;;  . ESOPHAGEAL BANDING N/A 03/23/2018   Procedure: ESOPHAGEAL BANDING;  Surgeon: Ronnette Juniper, MD;  [facility];  Service: Gastroenterology;  Laterality: N/A;  . ESOPHAGOGASTRODUODENOSCOPY N/A 03/23/2018   Procedure: ESOPHAGOGASTRODUODENOSCOPY (EGD);  Surgeon: Ronnette Juniper, MD;  Location: [facility];  Service: Gastroenterology;  Laterality: N/A;  . ESOPHAGOGASTRODUODENOSCOPY (EGD) WITH PROPOFOL N/A 02/10/2018   Procedure: ESOPHAGOGASTRODUODENOSCOPY (EGD) WITH PROPOFOL;  Surgeon: Ronnette Juniper, MD;  [facility];  Service: Gastroenterology;   Laterality: N/A;  . EYE SURGERY Bilateral 09/19/14 R eye, 08/2014 L eye   open angles in both eyes d/t narrow angle glaucoma  . LAPAROTOMY N/A 03/29/2014   Procedure: EXPLORATORY LAPAROTOMY;  Surgeon: Everitt Amber, MD;  [facility];  Service: Gynecology;  Laterality: N/A;  . ORIF ANKLE FRACTURE Right 03/09/2018   Procedure: OPEN REDUCTION INTERNAL FIXATION (ORIF) RIGHT LATERAL MALLEOLUS;  Surgeon: Leandrew Koyanagi, MD;  Location: [place];  Service: Orthopedics;  Laterality: Right;  . PARTIAL HYSTERECTOMY  1988  . SALPINGOOPHORECTOMY Bilateral 03/29/2014   Procedure: BILATERAL SALPINGO OOPHORECTOMY, OMENTECTOMY, DEBULKING;  Surgeon: Everitt Amber, MD;  [facility];  Service: Gynecology;  Laterality: Bilateral;  . TONSILLECTOMY  1971  . TUBAL LIGATION    . [facility]  11/24/2009   Social History   Occupational History  . Occupation: Retired  Tobacco Use  . Smoking status: Former Smoker    Packs/day:  1.00    Years: 1.00    Pack years: 1.00    Types: Cigarettes    Last attempt to quit: 02/11/1982    Years since quitting: 36.1  . Smokeless tobacco: Never Used  Substance and Sexual Activity  . Alcohol use: No    Alcohol/week: 0.0 standard drinks  . Drug use: No  . Sexual activity: Not on file

## 2018-04-07 ENCOUNTER — Ambulatory Visit (INDEPENDENT_AMBULATORY_CARE_PROVIDER_SITE_OTHER): Payer: Medicare Other | Admitting: Orthopaedic Surgery

## 2018-04-07 ENCOUNTER — Ambulatory Visit (INDEPENDENT_AMBULATORY_CARE_PROVIDER_SITE_OTHER): Payer: Medicare Other

## 2018-04-07 ENCOUNTER — Encounter (INDEPENDENT_AMBULATORY_CARE_PROVIDER_SITE_OTHER): Payer: Self-pay | Admitting: Orthopaedic Surgery

## 2018-04-07 VITALS — Ht 62.0 in | Wt 177.0 lb

## 2018-04-07 DIAGNOSIS — M25571 Pain in right ankle and joints of right foot: Secondary | ICD-10-CM | POA: Diagnosis not present

## 2018-04-07 DIAGNOSIS — S82831A Other fracture of upper and lower end of right fibula, initial encounter for closed fracture: Secondary | ICD-10-CM

## 2018-04-07 NOTE — Progress Notes (Signed)
Post-Op Visit Note   Patient: Olivia Benson           Date of Birth: [birth year]           MRN: [id number] Visit Date: 04/07/2018 PCP: Helane Rima, MD   Assessment & Plan:  Chief Complaint:  Chief Complaint  Patient presents with  . Right Ankle - Routine Post Op    03/09/2018 ORIF lateral malleolus fracture.    Visit Diagnoses:  1. Displaced fracture of distal end of right fibula   2. Pain in right ankle and joints of right foot     Plan: Olivia Benson is 4 weeks status post ORIF right lateral malleolus fracture.  She has no real complaints today.  Surgical scar is fully healed.  She has minimal swelling.  X-rays demonstrate significant improvement in healing and stable fixation.  At this point we will allow her to weight-bear in a cam walker as tolerated.  She may begin weaning in a couple weeks to an ASO brace which we provided today.  I have updated her physical therapy prescription today.  Follow-up in 6 weeks with three-view x-rays of the right ankle.  Follow-Up Instructions: Return in about 6 weeks (around 05/19/2018).   Orders:  Orders Placed This Encounter  Procedures  . XR Ankle Complete Right   No orders of the defined types were placed in this encounter.   Imaging: Xr Ankle Complete Right  Result Date: 04/07/2018 Significant bony healing of the fibula fracture with stable fixation.   PMFS History: Patient Active Problem List   Diagnosis Date Noted  . Chronic diastolic CHF (congestive heart failure) ([place]) 03/09/2018  . Fall 03/08/2018  . Displaced fracture of distal end of right fibula 03/08/2018  . GIB (gastrointestinal bleeding) 02/09/2018  . Obesity due to excess calories 03/20/2017  . Cardiomyopathy ([place]) 01/24/2017  . Sinus congestion 01/24/2017  . 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 Chemotherapy-induced nausea 10/24/2016  . Goals of care, counseling/discussion 09/03/2016  . Thrombocytopenia ([place]) 04/15/2016  . Hot flashes due to  tamoxifen 04/15/2016  . Vasomotor instability 01/17/2016  . Osteopenia determined by x-ray 12/22/2015  . Encounter for antineoplastic chemotherapy 12/09/2015  . Chemotherapy-induced neuropathy ([place]) 10/01/2015  . Elevated tumor markers 10/01/2015  . Obesity (BMI 35.0-39.9 without comorbidity) 05/31/2015  . Port catheter in place 03/29/2015  . Iron deficiency anemia 03/05/2015  . Low back pain 03/05/2015  . Neuropathic pain of foot 11/21/2014  . Diabetes mellitus without complication ([place]) 03/47/4259  . Portacath in place 05/10/2014  . Leukopenia due to antineoplastic chemotherapy ([place]) 05/10/2014  . Splenomegaly 05/10/2014  . Primary peritoneal carcinomatosis ([place]) 05/08/2014  . Genetic testing 03/16/2014  . Neutropenic fever ([place]) 01/27/2014  . Pancytopenia, acquired ([place]) 01/27/2014  . Fever and neutropenia ([place]) 01/27/2014  . Asthma 01/27/2014  . Malignant ascites 01/25/2014  . Chemotherapy induced neutropenia ([place]) 01/25/2014  . Chemotherapy induced thrombocytopenia 01/25/2014  . Restless legs syndrome 01/25/2014  . Liver cirrhosis secondary to NASH ([place]) 01/02/2014  . 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 Ascites 11/15/2013  . Elevated cancer antigen 125 (CA-125) 11/15/2013  . Cough variant asthma vs UACS 09/02/2013  . Essential hypertension 09/02/2013  . Dyspnea 10/22/2012  . Fatty liver disease, nonalcoholic 56/38/7564  . S/P cardiac cath 11/06/2010   Past Medical History:  Diagnosis Date  . Asthma   . Cirrhosis ([place])   . Complication of anesthesia    slow  to wake up / n & v  . Diabetes mellitus   . Elevated LFTs   . Family history of adverse reaction to anesthesia    multiple family members have had problems such as slow to wake / "flat lined" /  ventilator    . Frequency of urination   . Glaucoma, narrow-angle   . History of skin cancer   . Hypertension   . Non-ST  elevated myocardial infarction (non-STEMI) [facility]) Sept 2012   Mild elevation in troponin and CK MB but no evidence of coronary disease at time of cath  . Obesity   . Ovarian cancer ([place]) 11/22/2013   Disseminated ovarian cancer  . Overactive bladder   . PONV (postoperative nausea and vomiting)   . Portal hypertension ([place])   . Restless leg syndrome   . Tingling    hands and feet    Family History  Problem Relation Age of Onset  . Asthma Mother   . Diabetes Mother   . Hypertension Mother   . Allergies Mother   . Rheum arthritis Mother   . Bladder Cancer Father 69       former heavy smoker  . Hypertension Father   . Stroke Father   . Breast cancer Sister 58  . Diabetes Sister        x 3  . Allergies Sister   . Asthma Sister        x 2   . Breast cancer Maternal Aunt 85  . Breast cancer Cousin 1       (maternal) bilateral cancer  . Aneurysm Son 34  . Diabetes Brother   . Allergies Brother   . Asthma Brother     Past Surgical History:  Procedure Laterality Date  . ABDOMINAL ULTRASOUND  Sept 2012   No gallbladder disease, + fatty liver  . CARDIAC CATHETERIZATION  10/25/2010   EF 60%; Normal coronaries  . COLONOSCOPY  10/18/2005  . COSMETIC SURGERY  2003  . DENTAL SURGERY    . ESOPHAGEAL BANDING  02/10/2018   Procedure: ESOPHAGEAL BANDING;  Surgeon: Ronnette Juniper, MD;  Location: [facility];  Service: Gastroenterology;;  . ESOPHAGEAL BANDING N/A 03/23/2018   Procedure: ESOPHAGEAL BANDING;  Surgeon: Ronnette Juniper, MD;  Location: [facility];  Service: Gastroenterology;  Laterality: N/A;  . ESOPHAGOGASTRODUODENOSCOPY N/A 03/23/2018   Procedure: ESOPHAGOGASTRODUODENOSCOPY (EGD);  Surgeon: Ronnette Juniper, MD;  Location: [facility];  Service: Gastroenterology;  Laterality: N/A;  . ESOPHAGOGASTRODUODENOSCOPY (EGD) WITH PROPOFOL N/A 02/10/2018   Procedure: ESOPHAGOGASTRODUODENOSCOPY (EGD) WITH PROPOFOL;  Surgeon: Ronnette Juniper, MD;  Location: [facility];  Service: Gastroenterology;   Laterality: N/A;  . EYE SURGERY Bilateral 09/19/14 R eye, 08/2014 L eye   open angles in both eyes d/t narrow angle glaucoma  . LAPAROTOMY N/A 03/29/2014   Procedure: EXPLORATORY LAPAROTOMY;  Surgeon: Everitt Amber, MD;  Location: [facility];  Service: Gynecology;  Laterality: N/A;  . ORIF ANKLE FRACTURE Right 03/09/2018   Procedure: OPEN REDUCTION INTERNAL FIXATION (ORIF) RIGHT LATERAL MALLEOLUS;  Surgeon: Leandrew Koyanagi, MD;  Location: [facility];  Service: Orthopedics;  Laterality: Right;  . PARTIAL HYSTERECTOMY  1988  . SALPINGOOPHORECTOMY Bilateral 03/29/2014   Procedure: BILATERAL SALPINGO OOPHORECTOMY, OMENTECTOMY, DEBULKING;  Surgeon: Everitt Amber, MD;  Location: [facility];  Service: Gynecology;  Laterality: Bilateral;  . TONSILLECTOMY  1971  . TUBAL LIGATION    . [facility]  11/24/2009   Social History   Occupational History  . Occupation: Retired  Tobacco Use  . Smoking status:  Former Smoker    Packs/day: 1.00    Years: 1.00    Pack years: 1.00    Types: Cigarettes    Last attempt to quit: 02/11/1982    Years since quitting: 36.1  . Smokeless tobacco: Never Used  Substance and Sexual Activity  . Alcohol use: No    Alcohol/week: 0.0 standard drinks  . Drug use: No  . Sexual activity: Not on file

## 2018-04-08 ENCOUNTER — Inpatient Hospital Stay: Payer: Medicare Other | Attending: Hematology and Oncology

## 2018-04-08 ENCOUNTER — Ambulatory Visit (HOSPITAL_COMMUNITY)
Admission: RE | Admit: 2018-04-08 | Discharge: 2018-04-08 | Disposition: A | Discharge status: Home / Self Care | Payer: Medicare Other | Source: Ambulatory Visit | Attending: Hematology and Oncology | Admitting: Hematology and Oncology

## 2018-04-08 ENCOUNTER — Encounter (HOSPITAL_COMMUNITY): Payer: Self-pay

## 2018-04-08 ENCOUNTER — Inpatient Hospital Stay: Payer: Medicare Other

## 2018-04-08 DIAGNOSIS — K7460 Unspecified cirrhosis of liver: Secondary | ICD-10-CM

## 2018-04-08 DIAGNOSIS — C482 Malignant neoplasm of peritoneum, unspecified: Secondary | ICD-10-CM | POA: Insufficient documentation

## 2018-04-08 DIAGNOSIS — K7469 Other cirrhosis of liver: Secondary | ICD-10-CM | POA: Diagnosis not present

## 2018-04-08 DIAGNOSIS — D509 Iron deficiency anemia, unspecified: Secondary | ICD-10-CM | POA: Diagnosis not present

## 2018-04-08 DIAGNOSIS — Z79810 Long term (current) use of selective estrogen receptor modulators (SERMs): Secondary | ICD-10-CM | POA: Insufficient documentation

## 2018-04-08 DIAGNOSIS — D696 Thrombocytopenia, unspecified: Secondary | ICD-10-CM | POA: Diagnosis not present

## 2018-04-08 DIAGNOSIS — K7581 Nonalcoholic steatohepatitis (NASH): Secondary | ICD-10-CM | POA: Diagnosis present

## 2018-04-08 DIAGNOSIS — Z79899 Other long term (current) drug therapy: Secondary | ICD-10-CM | POA: Insufficient documentation

## 2018-04-08 DIAGNOSIS — C569 Malignant neoplasm of unspecified ovary: Secondary | ICD-10-CM

## 2018-04-08 DIAGNOSIS — Z9221 Personal history of antineoplastic chemotherapy: Secondary | ICD-10-CM | POA: Insufficient documentation

## 2018-04-08 LAB — CBC WITH DIFFERENTIAL/PLATELET
Abs Immature Granulocytes: 0.01 10*3/uL (ref 0.00–0.07)
Basophils Absolute: 0 10*3/uL (ref 0.0–0.1)
Basophils Relative: 0 %
Eosinophils Absolute: 0.1 10*3/uL (ref 0.0–0.5)
Eosinophils Relative: 3 %
HCT: 39.1 % (ref 36.0–46.0)
Hemoglobin: 12.7 g/dL (ref 12.0–15.0)
Immature Granulocytes: 0 %
Lymphocytes Relative: 34 %
Lymphs Abs: 1.6 10*3/uL (ref 0.7–4.0)
MCH: 31.1 pg (ref 26.0–34.0)
MCHC: 32.5 g/dL (ref 30.0–36.0)
MCV: 95.6 fL (ref 80.0–100.0)
Monocytes Absolute: 0.3 10*3/uL (ref 0.1–1.0)
Monocytes Relative: 6 %
Neutro Abs: 2.7 10*3/uL (ref 1.7–7.7)
Neutrophils Relative %: 57 %
Platelets: 84 10*3/uL — ABNORMAL LOW (ref 150–400)
RBC: 4.09 MIL/uL (ref 3.87–5.11)
RDW: 15.2 % (ref 11.5–15.5)
WBC: 4.7 10*3/uL (ref 4.0–10.5)
nRBC: 0 % (ref 0.0–0.2)

## 2018-04-08 LAB — IRON AND TIBC
Iron: 82 ug/dL (ref 41–142)
Saturation Ratios: 27 % (ref 21–57)
TIBC: 303 ug/dL (ref 236–444)
UIBC: 221 ug/dL (ref 120–384)

## 2018-04-08 LAB — COMPREHENSIVE METABOLIC PANEL
ALT: 39 U/L (ref 0–44)
AST: 48 U/L — ABNORMAL HIGH (ref 15–41)
Albumin: 3.8 g/dL (ref 3.5–5.0)
Alkaline Phosphatase: 113 U/L (ref 38–126)
Anion gap: 10 (ref 5–15)
BUN: 11 mg/dL (ref 8–23)
CO2: 26 mmol/L (ref 22–32)
Calcium: 9.3 mg/dL (ref 8.9–10.3)
Chloride: 107 mmol/L (ref 98–111)
Creatinine, Ser: 0.87 mg/dL (ref 0.44–1.00)
GFR calc Af Amer: >60 mL/min (ref >60)
GFR calc non Af Amer: >60 mL/min (ref >60)
Glucose, Bld: 96 mg/dL (ref 70–99)
Potassium: 4 mmol/L (ref 3.5–5.1)
Sodium: 143 mmol/L (ref 135–145)
Total Bilirubin: 0.9 mg/dL (ref 0.3–1.2)
Total Protein: 7.4 g/dL (ref 6.5–8.1)

## 2018-04-08 LAB — TOTAL PROTEIN, URINE DIPSTICK: Protein, ur: NEGATIVE mg/dL

## 2018-04-08 LAB — FERRITIN: Ferritin: 215 ng/mL (ref 11–307)

## 2018-04-08 MED ORDER — SODIUM CHLORIDE 0.9% FLUSH
10.0000 mL | Freq: Once | INTRAVENOUS | Status: AC
  Administered 2018-04-08: 10 mL
  Filled 2018-04-08: qty 10

## 2018-04-08 MED ORDER — HEPARIN SOD (PORK) LOCK FLUSH 100 UNIT/ML IV SOLN
INTRAVENOUS | Status: AC
  Filled 2018-04-08: qty 5

## 2018-04-08 MED ORDER — IOHEXOL 300 MG/ML  SOLN
30.0000 mL | Freq: Once | INTRAMUSCULAR | Status: AC | PRN
  Administered 2018-04-08: 30 mL via ORAL

## 2018-04-08 MED ORDER — HEPARIN SOD (PORK) LOCK FLUSH 100 UNIT/ML IV SOLN
500.0000 [IU] | Freq: Once | INTRAVENOUS | Status: AC
  Administered 2018-04-08: 500 [IU] via INTRAVENOUS

## 2018-04-08 MED ORDER — SODIUM CHLORIDE (PF) 0.9 % IJ SOLN
INTRAMUSCULAR | Status: AC
  Filled 2018-04-08: qty 50

## 2018-04-08 MED ORDER — IOHEXOL 300 MG/ML  SOLN
100.0000 mL | Freq: Once | INTRAMUSCULAR | Status: AC | PRN
  Administered 2018-04-08: 100 mL via INTRAVENOUS

## 2018-04-09 ENCOUNTER — Telehealth: Payer: Self-pay | Admitting: Hematology and Oncology

## 2018-04-09 ENCOUNTER — Inpatient Hospital Stay (HOSPITAL_BASED_OUTPATIENT_CLINIC_OR_DEPARTMENT_OTHER): Payer: Medicare Other | Admitting: Hematology and Oncology

## 2018-04-09 ENCOUNTER — Telehealth: Payer: Self-pay

## 2018-04-09 ENCOUNTER — Encounter: Payer: Self-pay | Admitting: Hematology and Oncology

## 2018-04-09 ENCOUNTER — Inpatient Hospital Stay: Payer: Medicare Other

## 2018-04-09 DIAGNOSIS — K7469 Other cirrhosis of liver: Secondary | ICD-10-CM | POA: Diagnosis not present

## 2018-04-09 DIAGNOSIS — Z79810 Long term (current) use of selective estrogen receptor modulators (SERMs): Secondary | ICD-10-CM

## 2018-04-09 DIAGNOSIS — K7581 Nonalcoholic steatohepatitis (NASH): Secondary | ICD-10-CM | POA: Diagnosis not present

## 2018-04-09 DIAGNOSIS — C482 Malignant neoplasm of peritoneum, unspecified: Secondary | ICD-10-CM | POA: Diagnosis not present

## 2018-04-09 DIAGNOSIS — Z9221 Personal history of antineoplastic chemotherapy: Secondary | ICD-10-CM

## 2018-04-09 DIAGNOSIS — D509 Iron deficiency anemia, unspecified: Secondary | ICD-10-CM

## 2018-04-09 DIAGNOSIS — K529 Noninfective gastroenteritis and colitis, unspecified: Secondary | ICD-10-CM

## 2018-04-09 DIAGNOSIS — Z79899 Other long term (current) drug therapy: Secondary | ICD-10-CM

## 2018-04-09 DIAGNOSIS — D696 Thrombocytopenia, unspecified: Secondary | ICD-10-CM

## 2018-04-09 DIAGNOSIS — K7460 Unspecified cirrhosis of liver: Secondary | ICD-10-CM

## 2018-04-09 LAB — CA 125: Cancer Antigen (CA) 125: 82.7 U/mL — ABNORMAL HIGH (ref 0.0–38.1)

## 2018-04-09 MED ORDER — HYDROCODONE-ACETAMINOPHEN 5-325 MG PO TABS: ORAL_TABLET | ORAL | 0 refills | Status: DC

## 2018-04-09 MED ORDER — CIPROFLOXACIN HCL 500 MG PO TABS: 500.0000 mg | ORAL_TABLET | Freq: Two times a day (BID) | ORAL | 0 refills | Status: DC

## 2018-04-09 MED ORDER — METRONIDAZOLE 500 MG PO TABS: 500.0000 mg | ORAL_TABLET | Freq: Three times a day (TID) | ORAL | 0 refills | Status: DC

## 2018-04-09 MED ORDER — PANTOPRAZOLE SODIUM 40 MG PO TBEC: 40.0000 mg | DELAYED_RELEASE_TABLET | Freq: Two times a day (BID) | ORAL | 0 refills | Status: DC

## 2018-04-09 NOTE — Assessment & Plan Note (Signed)
CT imaging show evidence of liver cirrhosis and splenomegaly Her liver enzymes are within normal limits Observe only for now

## 2018-04-09 NOTE — Telephone Encounter (Signed)
-----   Message from [name], MD sent at 04/09/2018 10:46 AM EST ----- Regarding: colitis Ct showed signs of colitis; no signs of cancer I recommend her to continue on bland diet I will send prescription for antibiotics for 1 week and if not improved she needs to contact her GI doctor for eval

## 2018-04-09 NOTE — Telephone Encounter (Signed)
Called and given below message. She verbalized understanding. 

## 2018-04-09 NOTE — Telephone Encounter (Signed)
Gave avs and calendar ° °

## 2018-04-09 NOTE — Assessment & Plan Note (Signed)
She has recurrent iron deficiency anemia due to GI bleed She had endoscopic procedure recently and has no bleeding since then. Repeat iron studies are adequate.

## 2018-04-09 NOTE — Progress Notes (Signed)
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[facility]) I have reviewed CT imaging and blood work with the patient She has no signs of active cancer She will continue close surveillance She will get her port flushed every [redacted] weeks along with blood work and I will see her back in 3 months  Colitis, acute She has abdominal symptoms CT scan show no evidence of cancer but signs of acute colitis I will give her a week course of antibiotics along with instruction to stay on a bland diet If it does not improve by next week, she will contact her GI doctor for further management  Liver cirrhosis secondary to NASH [facility]) CT imaging show evidence of liver cirrhosis and splenomegaly Her liver enzymes are within normal limits Observe only for now  Thrombocytopenia ([place]) The intermittent leukopenia 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 She is not symptomatic.  Recommend close observation only  Iron deficiency anemia She has recurrent iron deficiency anemia due to GI bleed She had endoscopic procedure recently and has no bleeding since then. Repeat iron studies are adequate.   No orders of the defined types were placed in this encounter.   INTERVAL HISTORY: Please see below for problem oriented charting. She returns with her husband to review test results She had recent fall requiring surgery She is currently undergoing physical therapy She complained of some recent abdominal pain and changes in bowel habits She is eating a bland diet and has lost some weight The patient denies any recent signs or symptoms of bleeding such as spontaneous epistaxis, hematuria or hematochezia. 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  No worrisome liver mass.  There are associated portal venous collaterals and splenomegaly consistent with portal venous hypertension. 2.  No other significant upper abdominal findings    11/08/2013 Imaging    US showed ascites    11/08/2013 Initial Diagnosis    Patient had ~ 2 months of some urinary incontinence, saw Dr Ronita Hipps in 10-2013, with pelvic mass on exam     11/12/2013 Imaging    There are findings of progressive hepatic cirrhosis with a large amount of ascites and mild splenomegaly. There are venous collaterals present. 2. There is abnormal thickening of the peritoneal surface along the left mid and lower abdominal wall. There is abnormal soft tissue density in the pelvis as well which could reflect the clinically suspected ovarian malignancy but the findings are nondiagnostic. 3. There is a new small left pleural effusion. 4. There is no acute bowel abnormality.    11/18/2013 Imaging    Successful ultrasound guided diagnostic and therapeutic paracentesis yielding 4.1 liters of ascites.     11/18/2013 Pathology Results    PERITONEAL/ASCITIC FLUID (SPECIMEN 1 OF 1 COLLECTED 11/18/13): SEROUS CARCINOMA, PLEASE SEE COMMENT.    12/01/2013 Tumor Marker    Patient's tumor was tested for the following markers: CA125 Results of the tumor marker test revealed 1938    12/03/2013 Imaging    Successful ultrasound-guided therapeutic paracentesis yielding 3.6 liters of peritoneal fluid.    12/07/2013 - 05/30/2014 Chemotherapy    She received 3 cycles of carboplatin and Taxol, interrupted cycle 4  for surgery.  She subsequently completed 3 more cycles of chemotherapy after surgery    12/20/2013 Imaging    Successful ultrasound-guided diagnostic and therapeutic paracentesis yielding 2.2 liters of peritoneal fluid    12/20/2013 Pathology Results    PERITONEAL/ASCITIC FLUID(SPECIMEN 1 OF 1  COLLECTED 12/20/13): MALIGNANT CELLS CONSISTENT WITH SEROUS CARCINOMA    01/13/2014 Tumor Marker    Patient's tumor was tested for the following markers: CA125 Results of the tumor marker test revealed 227    02/07/2014 Tumor Marker    Patient's tumor was tested for the following markers: CA125 Results of the tumor marker test revealed 130    02/15/2014 Genetic Testing    Genetics testing from 02-2014 normal ([employer] panel)    02/23/2014 Imaging    Interval decrease in ascites. 2. Morphologic changes in the liver consistent with cirrhosis. 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 Portal vein remains patent at this time. 3. Persistent but decreased abnormal soft tissue attenuation tracking in the omental fat. This may be secondary to interval improvement in metastatic disease. 4. Peritoneal thickening seen in the left abdomen and pelvis on the previous study has decreased and nearly resolved in the interval. This also suggests interval improvement in metastatic disease.     03/07/2014 Procedure    Ultrasound and fluoroscopically guided right internal jugular single lumen power port catheter insertion. Tip in the SVC/RA junction. Catheter ready for use.    03/17/2014 Tumor Marker    Patient's tumor was tested for the following markers: CA125 Results of the tumor marker test revealed 45    03/29/2014 Pathology Results    1. Omentum, resection for tumor - HIGH GRADE SEROUS CARCINOMA, SEE COMMENT. 2. Ovary and fallopian tube, right - HIGH GRADE SEROUS CARCINOMA, SEE COMMENT. 3. Ovary and fallopian tube, left - HIGH GRADE SEROUS CARCINOMA, SEE COMMENT. Diagnosis Note 1. Nests and clusters of malignant cells are invading the omental tissue (part #1) with associated fibrosis. The cells are pleomorphic with prominent nucleoli. There are scattered psammoma bodies. The ovaries are atrophic and exhibit multiple foci of surface based invasive carcinoma and associated  fibrosis. The fallopian tubes have a few foci of carcinoma, also superficially located. There are no precursor lesions noted in the ovaries or fallopian tubes (the entire tubes and ovaries were submitted for evaluation). While there is some retraction artifact, there are several foci suspicious for lymphovascular invasion. 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 Given the fibrosis, there does appear to be a small amount of treatment effect, however, there is abundant residual tumor.     03/29/2014 Surgery    Procedure(s) Performed: 1. Exploratory laparotomy with bilateral salpingo-oophorectomy, omentectomy radical tumor debulking for ovarian cancer .  Surgeon: Thereasa Solo, MD.  Assistant Surgeon: Lahoma Crocker, M.D. Assistant: (an MD assistant was necessary for tissue manipulation, retraction and positioning due to the complexity of the case and hospital policies).  Operative Findings: 10cm omental cake from hepatic to splenic flexure, densely adherent to transverse colon. Milliary studding of tumor implants (<6m, too numerous in number to count) adherent to the mesentery of the small bowel and small bowel wall. Small volume (100cc) ascites. Small ovaries bilaterally, densely adherent to the pelvic cul de sac. Left ovary and tube densely adherent to sigmoid colon. Cirrhotic liver with hepatomegaly and splenomegaly.     This represented an optimal cytoreduction (R1)  with visible disease residual on the bowel wall and mesentery (millial, <23m implants).      04/18/2014 Tumor Marker    Patient's tumor was tested for the following markers: CA125 Results of the tumor marker test revealed 122    05/16/2014 Tumor Marker    Patient's tumor was tested for the following markers: CA125 Results of the tumor marker test revealed 30    06/10/2014 Tumor Marker     Patient's tumor was tested for the following markers: CA125 Results of the tumor marker test revealed 19    07/04/2014 Imaging    Interval improvement in the appearance of peritoneal metastasis secondary to ovarian cancer. 2. 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     08/18/2014 Tumor Marker    Patient's tumor was tested for the following markers: CA125 Results of the tumor marker test revealed 16    10/13/2014 Tumor Marker    Patient's tumor was tested for the following markers: CA125 Results of the tumor marker test revealed 14    11/21/2014 Tumor Marker    Patient's tumor was tested for the following markers: CA125 Results of the tumor marker test revealed 16    12/28/2014 Tumor Marker    Patient's tumor was tested for the following markers: CA125 Results of the tumor marker test revealed 762    02/27/2015 Tumor Marker    Patient's tumor was tested for the following markers: CA125 Results of the tumor marker test revealed 20.2    03/22/2015 Imaging    Filler appearance to the prior exam, with very subtle fluid tracking along portions of the liver, and very minimal nodularity along the right paracolic gutter representing residua from the prior peritoneal cancer. The current abnormalities could simply be post therapy findings rather than necessarily representing residual malignancy. No new or enlarging lesions are identified. 2. Hepatic cirrhosis and splenomegaly. There is some gastric varices suggesting portal venous hypertension. 3. Left foraminal stenosis at L4-5 due to spurring. There is likely also some central narrowing of the thecal sac at this level. 4. Bibasilar scarring. 5. Chronic mild left mid kidney scarring    03/27/2015 Tumor Marker    Patient's tumor was tested for the following markers: CA125 Results of the tumor marker test revealed 25.3    04/24/2015 Imaging    Disc bulge L2-3 with mild spinal stenosis and right foraminal encroachment. 2. Disc  bulge L3-4 with mild spinal stenosis. 3. Multifactorial spinal, left lateral recess and foraminal stenosis L4-5. There is grade 1 anterolisthesis without evident dynamic instability.    05/29/2015 Tumor Marker    Patient's tumor was tested for the following markers: CA125 Results of the tumor marker test revealed 29.9    08/21/2015 Tumor Marker    Patient's tumor was tested for the following markers: CA125 Results of the tumor marker test revealed 45    09/18/2015 Tumor Marker    Patient's tumor was tested for the following markers: CA125 Results of the tumor marker test revealed 47    09/27/2015 Imaging    New small bowel mesenteric nodule and enlarged left external iliac lymph node, highly worrisome for metastatic disease. 2. Cirrhosis and splenomegaly    10/10/2015 Tumor Marker    Patient's tumor was tested for the following markers: CA125 Results of the tumor marker test revealed 53.4    10/10/2015 - 12/11/2015 Chemotherapy    She received 4 cycles of carboplatin only    11/20/2015 Tumor Marker    Patient's tumor was tested  for the following markers: CA125 Results of the tumor marker test revealed 41.3    12/20/2015 Imaging    CT: Mild left external iliac lymphadenopathy is slightly decreased. Mildly enlarged lower mesenteric nodule is slightly decreased. 2. No new or progressive metastatic disease in the abdomen or pelvis. 3. Cirrhosis. Stable subcentimeter hypodense left liver lobe lesion. No new liver lesions. 4. Stable mild splenomegaly.  No ascites. 5. Mild sigmoid diverticulosis.    01/01/2016 -  Anti-estrogen oral therapy    She was placed on tamoxifen. Had letrozole initially but switched to tamoxifen due to poor tolerance    01/15/2016 Tumor Marker    Patient's tumor was tested for the following markers: CA125 Results of the tumor marker test revealed 31.4    02/19/2016 Tumor Marker    Patient's tumor was tested for the following markers: CA125 Results of the tumor marker  test revealed 36.5    05/13/2016 Imaging    No evidence of disease progression within the abdomen or pelvis. Previously noted small central mesenteric nodule and left external iliac node appear slightly smaller. 2. Stable changes of hepatic cirrhosis and portal hypertension with associated splenomegaly. No new or enlarging hepatic lesions are identified. 3. No acute findings.    05/13/2016 Tumor Marker    Patient's tumor was tested for the following markers: CA125 Results of the tumor marker test revealed 41.2    06/24/2016 Tumor Marker    Patient's tumor was tested for the following markers: CA125 Results of the tumor marker test revealed 47.3    08/20/2016 Imaging    1. The left external iliac lymph node is slightly increased in size compared to the prior exam, currently 1.1 cm and previously 0.9 cm in diameter. 2. Stable central mesenteric lymph node at 0.9 cm in diameter. 3. Stable trace free pelvic fluid and stable slight thickening along the right paracolic gutter, without well-defined peritoneal nodularity. 4. Other imaging findings of potential clinical significance: Subsegmental atelectasis or scarring in the lung bases. Hepatic cirrhosis. Left renal scarring. Mild splenomegaly. Sigmoid colon diverticulosis. Impingement at L4-5 due to spondylosis and degenerative disc disease broad Schmorl' s nodes at L3-L4. Pelvic floor laxity    08/23/2016 Tumor Marker    Patient's tumor was tested for the following markers: CA125 Results of the tumor marker test revealed 80.2    09/04/2016 Imaging    LV EF: 60% -   65%    09/12/2016 Tumor Marker    Patient's tumor was tested for the following markers: CA125 Results of the tumor marker test revealed 98.5    09/12/2016 - 01/29/2018 Chemotherapy    The patient received Doxil and Avastin. Avastin was discontinued in Dec 2019 due to GI hemorrhage    09/26/2016 Tumor Marker    Patient's tumor was tested for the following markers: CA125 Results of the  tumor marker test revealed 68.9    10/10/2016 Tumor Marker    Patient's tumor was tested for the following markers: CA125 Results of the tumor marker test revealed 65.6    11/07/2016 Tumor Marker    Patient's tumor was tested for the following markers: CA125 Results of the tumor marker test revealed 46.4    11/29/2016 Imaging    Stable mild peritoneal thickening, suspicious for peritoneal carcinomatosis. No new or progressive disease identified. No evidence of ascites.  No significant change in 10 mm left external iliac and 8 mm mesenteric lymph nodes.  Stable hepatic cirrhosis, and splenomegaly consistent with portal venous hypertension.  Colonic diverticulosis,  without radiographic evidence of diverticulitis.    12/02/2016 Imaging    Normal LV size with EF 55%. Basal inferior and basal inferoseptal hypokinesis. Normal RV size and systolic function. No significant valvular abnormalities.    12/05/2016 Tumor Marker    Patient's tumor was tested for the following markers: CA125 Results of the tumor marker test revealed 49.1    01/09/2017 Tumor Marker    Patient's tumor was tested for the following markers: CA125 Results of the tumor marker test revealed 47.2    02/06/2017 Tumor Marker    Patient's tumor was tested for the following markers: CA125 Results of the tumor marker test revealed 44.1    02/18/2017 Imaging    Stable mild peritoneal thickening. No new or progressive disease identified within the abdomen or pelvis.  Stable tiny sub-cm left external iliac and mesenteric lymph nodes.  Stable hepatic cirrhosis, and splenomegaly consistent with portal venous hypertension. No evidence of hepatic neoplasm.  Colonic diverticulosis, without radiographic evidence of diverticulitis.    03/06/2017 Tumor Marker    Patient's tumor was tested for the following markers: CA125 Results of the tumor marker test revealed 50.4    03/17/2017 Imaging    - Left ventricle: The cavity size was  normal. Wall thickness was normal. Systolic function was normal. The estimated ejection fraction was in the range of 55% to 60%. Wall motion was normal; there were no regional wall motion abnormalities. Features are consistent with a pseudonormal left ventricular filling pattern, with concomitant abnormal relaxation and increased filling pressure (grade 2 diastolic dysfunction). - Mitral valve: There was mild regurgitation. - Left atrium: The atrium was mildly dilated    04/03/2017 Tumor Marker    Patient's tumor was tested for the following markers: CA125 Results of the tumor marker test revealed 47.2    05/14/2017 Imaging    CT scan of abdomen and pelvis Stable mild peritoneal thickening. No new or progressive disease within the abdomen or pelvis.  Stable hepatic cirrhosis. Stable splenomegaly, consistent with portal venous hypertension. No evidence of hepatic neoplasm.    05/15/2017 Tumor Marker    Patient's tumor was tested for the following markers: CA125 Results of the tumor marker test revealed 56.1    06/12/2017 Tumor Marker    Patient's tumor was tested for the following markers: CA125 Results of the tumor marker test revealed 49.2    07/10/2017 Tumor Marker    Patient's tumor was tested for the following markers: CA125 Results of the tumor marker test revealed 46.3    08/07/2017 Tumor Marker    Patient's tumor was tested for the following markers: CA125 Results of the tumor marker test revealed 52.9    08/20/2017 Imaging    1. Mild omental/peritoneal haziness, unchanged. No evidence of new metastatic disease. 2. Cirrhosis with splenomegaly.    09/04/2017 Tumor Marker    Patient's tumor was tested for the following markers: CA125 Results of the tumor marker test revealed 48.5    11/13/2017 Tumor Marker    Patient's tumor was tested for the following markers: CA125 Results of the tumor marker test revealed 55.8    12/17/2017 Imaging    1. No findings to suggest metastatic  disease in the abdomen or pelvis. 2. Severe hepatic cirrhosis with evidence of portal hypertension, as demonstrated by dilated portal vein, splenomegaly and portosystemic collateral pathways, including gastric and esophageal varices.  3. Colonic diverticulosis without evidence of acute diverticulitis at this time. 4. Additional incidental findings, as above.    01/16/2018 Tumor  Marker    Patient's tumor was tested for the following markers: CA125 Results of the tumor marker test revealed 63.7    02/10/2018 Procedure    EGD - Recently bleeding grade III and large (> 5 mm) esophageal varices. Completely eradicated. Banded. - Portal hypertensive gastropathy. - Normal examined duodenum. - No specimens collected    02/13/2018 Tumor Marker    Patient's tumor was tested for the following markers: CA125 Results of the tumor marker test revealed 82.7    02/25/2018 Imaging    Bone density showed mild osteopenia    04/08/2018 Tumor Marker    Patient's tumor was tested for the following markers: CA125 Results of the tumor marker test revealed 82.7    04/08/2018 Imaging    1. No definite omental or peritoneal surface lesions. However, there are 2 slowly enlarging lymph nodes noted. One is in the small bowel mesentery and the other is in the left deep pelvis. Could not exclude recurrent disease. 2. Stable advanced cirrhotic changes involving the liver with portal venous hypertension, portal venous collaterals, esophageal varices and splenomegaly. No worrisome hepatic lesions. 3. Diffuse wall thickening of the colon could suggest diffuse colitis or could be due to low albumin. Recommend correlation with any symptoms such as diarrhea.     REVIEW OF SYSTEMS:   Constitutional: Denies fevers, chills or abnormal weight loss Eyes: Denies blurriness of vision Ears, nose, mouth, throat, and face: Denies mucositis or sore throat Respiratory: Denies cough, dyspnea or wheezes Cardiovascular: Denies  palpitation, chest discomfort or lower extremity swelling Gastrointestinal:  Denies nausea, heartburn or change in bowel habits Skin: Denies abnormal skin rashes Lymphatics: Denies new lymphadenopathy or easy bruising Neurological:Denies numbness, tingling or new weaknesses Behavioral/Psych: Mood is stable, no new changes  All other systems were reviewed with the patient and are negative.  I have reviewed the past medical history, past surgical history, social history and family history with the patient and they are unchanged from previous note.  ALLERGIES:  is allergic to shellfish allergy; benadryl [diphenhydramine hcl]; penicillins; and tape.  MEDICATIONS:  Current Outpatient Medications  Medication Sig Dispense Refill  . cholecalciferol (VITAMIN D) 1000 units tablet Take 1,000 Units by mouth at bedtime.     . ciprofloxacin (CIPRO) 500 MG tablet Take 1 tablet (500 mg total) by mouth 2 (two) times daily. 14 tablet 0  . gabapentin (NEURONTIN) 300 MG capsule Take 2 capsules (600 mg total) by mouth 2 (two) times daily. 120 capsule 11  . hydrochlorothiazide (MICROZIDE) 12.5 MG capsule Take 12.5 mg by mouth daily after breakfast.   1  . HYDROcodone-acetaminophen (NORCO/VICODIN) 5-325 MG tablet take 1 tablet by mouth every 6 hours if needed for MODERATE PAIN 60 tablet 0  . insulin degludec (TRESIBA FLEXTOUCH) 100 UNIT/ML SOPN FlexTouch Pen Inject 18 Units into the skin daily after breakfast.     . lidocaine-prilocaine (EMLA) cream Apply to Porta-cath  1-2 hours prior to access as directed. 30 g 9  . metroNIDAZOLE (FLAGYL) 500 MG tablet Take 1 tablet (500 mg total) by mouth 3 (three) times daily. 21 tablet 0  . nadolol (CORGARD) 20 MG tablet Take 1 tablet (20 mg total) by mouth daily. 90 tablet 3  . NEUPRO 4 MG/24HR APPLY 1 PATCH EXTERNALLY TO THE SKIN DAILY 30 patch 5  . ondansetron (ZOFRAN) 8 MG tablet Take 1 tablet (8 mg total) by mouth every 8 (eight) hours as needed for nausea. 30 tablet 3   . pantoprazole (PROTONIX) 40 MG tablet  Take 1 tablet (40 mg total) by mouth 2 (two) times daily. 60 tablet 0   No current facility-administered medications for this visit.     PHYSICAL EXAMINATION: ECOG PERFORMANCE STATUS: 2 - Symptomatic, <50% confined to bed  Vitals:   04/09/18 0926  BP: 117/74  Pulse: (!) 57  Resp: 18  Temp: 97.9 F (36.6 C)  SpO2: 100%   Filed Weights   04/09/18 0926  Weight: 173 lb (78.5 kg)    GENERAL:alert, no distress and comfortable SKIN: skin color, texture, turgor are normal, no rashes or significant lesions EYES: normal, Conjunctiva are pink and non-injected, sclera clear OROPHARYNX:no exudate, no erythema and lips, buccal mucosa, and tongue normal  NECK: supple, thyroid normal size, non-tender, without nodularity LYMPH:  no palpable lymphadenopathy in the cervical, axillary or inguinal LUNGS: clear to auscultation and percussion with normal breathing effort HEART: regular rate & rhythm and no murmurs and no lower extremity edema ABDOMEN:abdomen soft, with mild tenderness without rebound or guarding Musculoskeletal:no cyanosis of digits and no clubbing  NEURO: alert & oriented x 3 with fluent speech, no focal motor/sensory deficits  LABORATORY DATA:  I have reviewed the data as listed    Component Value Date/Time   NA 143 04/08/2018 1000   NA 142 02/06/2017 0742   K 4.0 04/08/2018 1000   K 3.9 02/06/2017 0742   CL 107 04/08/2018 1000   CO2 26 04/08/2018 1000   CO2 22 02/06/2017 0742   GLUCOSE 96 04/08/2018 1000   GLUCOSE 156 (H) 02/06/2017 0742   BUN 11 04/08/2018 1000   BUN 13.5 02/06/2017 0742   CREATININE 0.87 04/08/2018 1000   CREATININE 0.8 02/06/2017 0742   CALCIUM 9.3 04/08/2018 1000   CALCIUM 8.5 02/06/2017 0742   PROT 7.4 04/08/2018 1000   PROT 6.9 02/06/2017 0742   ALBUMIN 3.8 04/08/2018 1000   ALBUMIN 3.7 02/06/2017 0742   AST 48 (H) 04/08/2018 1000   AST 47 (H) 02/06/2017 0742   ALT 39 04/08/2018 1000   ALT 54  02/06/2017 0742   ALKPHOS 113 04/08/2018 1000   ALKPHOS 130 02/06/2017 0742   BILITOT 0.9 04/08/2018 1000   BILITOT 0.65 02/06/2017 0742   GFRNONAA >60 04/08/2018 1000   GFRAA >60 04/08/2018 1000    No results found for: SPEP, UPEP  Lab Results  Component Value Date   WBC 4.7 04/08/2018   NEUTROABS 2.7 04/08/2018   HGB 12.7 04/08/2018   HCT 39.1 04/08/2018   MCV 95.6 04/08/2018   PLT 84 (L) 04/08/2018      Chemistry      Component Value Date/Time   NA 143 04/08/2018 1000   NA 142 02/06/2017 0742   K 4.0 04/08/2018 1000   K 3.9 02/06/2017 0742   CL 107 04/08/2018 1000   CO2 26 04/08/2018 1000   CO2 22 02/06/2017 0742   BUN 11 04/08/2018 1000   BUN 13.5 02/06/2017 0742   CREATININE 0.87 04/08/2018 1000   CREATININE 0.8 02/06/2017 0742      Component Value Date/Time   CALCIUM 9.3 04/08/2018 1000   CALCIUM 8.5 02/06/2017 0742   ALKPHOS 113 04/08/2018 1000   ALKPHOS 130 02/06/2017 0742   AST 48 (H) 04/08/2018 1000   AST 47 (H) 02/06/2017 0742   ALT 39 04/08/2018 1000   ALT 54 02/06/2017 0742   BILITOT 0.9 04/08/2018 1000   BILITOT 0.65 02/06/2017 0742       RADIOGRAPHIC STUDIES: I have reviewed CT imaging with the patient  and her husband I have personally reviewed the radiological images as listed and agreed with the findings in the report. Ct Abdomen Pelvis W Contrast  Result Date: 04/09/2018 CLINICAL DATA:  Restaging ovarian cancer. EXAM: CT ABDOMEN AND PELVIS WITH CONTRAST TECHNIQUE: Multidetector CT imaging of the abdomen and pelvis was performed using the standard protocol following bolus administration of intravenous contrast. CONTRAST:  137m OMNIPAQUE IOHEXOL 300 MG/ML  SOLN COMPARISON:  Multiple prior CT scans from 2019. FINDINGS: Lower chest: Bibasilar scarring changes. No infiltrates or effusions. No worrisome pulmonary lesions. The heart is normal in size. No pericardial effusion. Small hiatal hernia noted. Bilateral breast prosthesis. Hepatobiliary:  Stable advanced cirrhotic changes involving the liver with portal venous hypertension, portal venous collaterals, esophageal varices and splenomegaly. No focal hepatic lesions or intrahepatic biliary dilatation. The gallbladder is grossly normal. No common bile duct dilatation. Pancreas: No mass, inflammation or ductal dilatation. Spleen: Stable splenomegaly.  No splenic lesions. Adrenals/Urinary Tract: The adrenal glands and kidneys are unremarkable. The bladder appears normal. Stomach/Bowel: The stomach, duodenum and small bowel are unremarkable. No acute inflammatory changes, mass lesions or obstructive findings. Mild diffuse wall thickening of the colon could be due to diffuse colitis or low albumin related to the patient's cirrhosis. No mass or obstructive findings. Vascular/Lymphatic: The aorta and branch vessels are patent. No significant atherosclerotic calcifications. The major venous structures are patent. Prominent portal and splenic veins. Portal venous collaterals. Upper abdominal lymph nodes are stable and typical with cirrhosis. 8.5 mm mesenteric node on image number 59 previously measured 7.5 mm. 20 mm soft tissue nodule in the pelvis on image number 78 of series 2 has slowly enlarged since prior examinations. It measures 16 mm on the study from November 2019 and 10.5 mm on the study from April 2019. I do not think this is a sigmoid diverticulum as there has never been any air in it. Reproductive: Surgically absent. Other: No ascites or evidence of omental/peritoneal surface disease. Musculoskeletal: No significant bony findings. IMPRESSION: 1. No definite omental or peritoneal surface lesions. However, there are 2 slowly enlarging lymph nodes noted. One is in the small bowel mesentery and the other is in the left deep pelvis. Could not exclude recurrent disease. 2. Stable advanced cirrhotic changes involving the liver with portal venous hypertension, portal venous collaterals, esophageal varices and  splenomegaly. No worrisome hepatic lesions. 3. Diffuse wall thickening of the colon could suggest diffuse colitis or could be due to low albumin. Recommend correlation with any symptoms such as diarrhea. Electronically Signed   By: PMarijo SanesM.D.   On: 04/09/2018 10:02   Xr Ankle Complete Right  Result Date: 04/07/2018 Significant bony healing of the fibula fracture with stable fixation.  Xr Ankle Complete Right  Result Date: 03/24/2018 Stable fixation and alignment of lateral malleolus fracture without complication.   All questions were answered. The patient knows to call the clinic with any problems, questions or concerns. No barriers to learning was detected.  I spent 30 minutes counseling the patient face to face. The total time spent in the appointment was 40 minutes and more than 50% was on counseling and review of test results  NHeath Lark MD 04/09/2018 12:43 PM

## 2018-04-09 NOTE — Assessment & Plan Note (Signed)
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 She is not symptomatic.  Recommend close observation only

## 2018-04-09 NOTE — Assessment & Plan Note (Signed)
She has abdominal symptoms CT scan show no evidence of cancer but signs of acute colitis I will give her a week course of antibiotics along with instruction to stay on a bland diet If it does not improve by next week, she will contact her GI doctor for further management

## 2018-04-09 NOTE — Assessment & Plan Note (Signed)
I have reviewed CT imaging and blood work with the patient She has no signs of active cancer She will continue close surveillance She will get her port flushed every [redacted] weeks along with blood work and I will see her back in 3 months

## 2018-04-15 ENCOUNTER — Other Ambulatory Visit: Payer: Self-pay | Admitting: Gastroenterology

## 2018-04-29 NOTE — Progress Notes (Signed)
Received VM from [facility] with [facility] Medicare stating that pt's Neupro PA has been  Approved thru 04/27/2019

## 2018-05-14 ENCOUNTER — Other Ambulatory Visit: Payer: Self-pay | Admitting: *Deleted

## 2018-05-14 MED ORDER — ROTIGOTINE 4 MG/24HR TD PT24: MEDICATED_PATCH | TRANSDERMAL | 5 refills | Status: DC

## 2018-05-21 ENCOUNTER — Other Ambulatory Visit: Payer: Self-pay | Admitting: Hematology and Oncology

## 2018-05-21 ENCOUNTER — Other Ambulatory Visit: Payer: Self-pay

## 2018-05-21 ENCOUNTER — Inpatient Hospital Stay: Payer: Medicare Other | Attending: Hematology and Oncology

## 2018-05-21 ENCOUNTER — Inpatient Hospital Stay: Payer: Medicare Other

## 2018-05-21 DIAGNOSIS — C482 Malignant neoplasm of peritoneum, unspecified: Secondary | ICD-10-CM

## 2018-05-21 DIAGNOSIS — C569 Malignant neoplasm of unspecified ovary: Secondary | ICD-10-CM

## 2018-05-21 DIAGNOSIS — D509 Iron deficiency anemia, unspecified: Secondary | ICD-10-CM

## 2018-05-21 LAB — COMPREHENSIVE METABOLIC PANEL
ALT: 48 U/L — ABNORMAL HIGH (ref 0–44)
AST: 49 U/L — ABNORMAL HIGH (ref 15–41)
Albumin: 3.9 g/dL (ref 3.5–5.0)
Alkaline Phosphatase: 106 U/L (ref 38–126)
Anion gap: 10 (ref 5–15)
BUN: 16 mg/dL (ref 8–23)
CO2: 24 mmol/L (ref 22–32)
Calcium: 9.1 mg/dL (ref 8.9–10.3)
Chloride: 108 mmol/L (ref 98–111)
Creatinine, Ser: 0.83 mg/dL (ref 0.44–1.00)
GFR calc Af Amer: >60 mL/min (ref >60)
GFR calc non Af Amer: >60 mL/min (ref >60)
Glucose, Bld: 119 mg/dL — ABNORMAL HIGH (ref 70–99)
Potassium: 4.1 mmol/L (ref 3.5–5.1)
Sodium: 142 mmol/L (ref 135–145)
Total Bilirubin: 1 mg/dL (ref 0.3–1.2)
Total Protein: 7.6 g/dL (ref 6.5–8.1)

## 2018-05-21 LAB — CBC WITH DIFFERENTIAL/PLATELET
Abs Immature Granulocytes: 0.01 K/uL (ref 0.00–0.07)
Basophils Absolute: 0 K/uL (ref 0.0–0.1)
Basophils Relative: 0 %
Eosinophils Absolute: 0.2 K/uL (ref 0.0–0.5)
Eosinophils Relative: 4 %
HCT: 37.8 % (ref 36.0–46.0)
Hemoglobin: 12.4 g/dL (ref 12.0–15.0)
Immature Granulocytes: 0 %
Lymphocytes Relative: 31 %
Lymphs Abs: 1.1 K/uL (ref 0.7–4.0)
MCH: 31.1 pg (ref 26.0–34.0)
MCHC: 32.8 g/dL (ref 30.0–36.0)
MCV: 94.7 fL (ref 80.0–100.0)
Monocytes Absolute: 0.2 K/uL (ref 0.1–1.0)
Monocytes Relative: 5 %
Neutro Abs: 2.2 K/uL (ref 1.7–7.7)
Neutrophils Relative %: 60 %
Platelets: 74 K/uL — ABNORMAL LOW (ref 150–400)
RBC: 3.99 MIL/uL (ref 3.87–5.11)
RDW: 14.9 % (ref 11.5–15.5)
WBC: 3.7 K/uL — ABNORMAL LOW (ref 4.0–10.5)
nRBC: 0 % (ref 0.0–0.2)

## 2018-05-21 LAB — IRON AND TIBC
Iron: 84 ug/dL (ref 41–142)
Saturation Ratios: 27 % (ref 21–57)
TIBC: 306 ug/dL (ref 236–444)
UIBC: 223 ug/dL (ref 120–384)

## 2018-05-21 LAB — FERRITIN: Ferritin: 127 ng/mL (ref 11–307)

## 2018-05-21 MED ORDER — HEPARIN SOD (PORK) LOCK FLUSH 100 UNIT/ML IV SOLN
500.0000 [IU] | Freq: Once | INTRAVENOUS | Status: AC
  Administered 2018-05-21: 500 [IU]
  Filled 2018-05-21: qty 5

## 2018-05-21 MED ORDER — HYDROCODONE-ACETAMINOPHEN 5-325 MG PO TABS: 1.0000 | ORAL_TABLET | Freq: Four times a day (QID) | ORAL | 0 refills | Status: DC | PRN

## 2018-05-21 MED ORDER — SODIUM CHLORIDE 0.9% FLUSH
10.0000 mL | Freq: Once | INTRAVENOUS | Status: AC
  Administered 2018-05-21: 10 mL
  Filled 2018-05-21: qty 10

## 2018-05-22 ENCOUNTER — Telehealth: Payer: Self-pay | Admitting: *Deleted

## 2018-05-22 LAB — CA 125: Cancer Antigen (CA) 125: 86.1 U/mL — ABNORMAL HIGH (ref 0.0–38.1)

## 2018-05-22 NOTE — Telephone Encounter (Signed)
Telephone call to patient and advised of lab results as directed below. No questions or concerns at this time.

## 2018-05-22 NOTE — Telephone Encounter (Signed)
-----   Message from [name], MD sent at 05/22/2018  9:47 AM EDT ----- Regarding: iron studies and CA-125 Let her know labs are stable. Iron panel is ok CA-125 stable

## 2018-05-26 ENCOUNTER — Other Ambulatory Visit: Payer: Self-pay

## 2018-05-26 ENCOUNTER — Encounter (INDEPENDENT_AMBULATORY_CARE_PROVIDER_SITE_OTHER): Payer: Self-pay | Admitting: Orthopaedic Surgery

## 2018-05-26 ENCOUNTER — Ambulatory Visit (INDEPENDENT_AMBULATORY_CARE_PROVIDER_SITE_OTHER): Payer: Medicare Other | Admitting: Orthopaedic Surgery

## 2018-05-26 ENCOUNTER — Ambulatory Visit (INDEPENDENT_AMBULATORY_CARE_PROVIDER_SITE_OTHER): Payer: Medicare Other

## 2018-05-26 DIAGNOSIS — S82831A Other fracture of upper and lower end of right fibula, initial encounter for closed fracture: Secondary | ICD-10-CM

## 2018-05-26 NOTE — Progress Notes (Signed)
Post-Op Visit Note   Patient: Olivia Benson           Date of Birth: Jun 25, 1945           MRN: [id number] Visit Date: 05/26/2018 PCP: Helane Rima, MD   Assessment & Plan:  Chief Complaint:  Chief Complaint  Patient presents with  . Right Ankle - Follow-up   Visit Diagnoses:  1. Displaced fracture of distal end of right fibula     Plan: Olivia Benson is 78 days status post ORIF right lip and lateral malleolus fracture.  She is overall doing well and reports no real complaints.  She has completed physical therapy and she has gone back to driving.  Overall she is doing well.  She does have noticeable weakness when it comes to walking on uneven ground during which she will use an ASO brace but otherwise she does not use it. Physical examination shows a fully healed surgical scar without any evidence of infection.  She does have some subjective paresthesias in the distribution of the superficial peroneal nerve but she reports no pain.  Mainly just numbness.  She has minimal swelling.  Her x-rays demonstrate a healed lateral malleolus fracture without any complications. At this point we discussed that she should continue to increase her activity as tolerated.  Use the ASO brace as needed.  She may start to resume her previous activities.  I would like to see her back 1 more time in 2 months with three-view x-rays of the right ankle.  If she is doing well I anticipate releasing her at that time.  Follow-Up Instructions: Return in about 2 months (around 07/26/2018).   Orders:  Orders Placed This Encounter  Procedures  . XR Ankle Complete Right   No orders of the defined types were placed in this encounter.   Imaging: Xr Ankle Complete Right  Result Date: 05/26/2018 Healed lateral malleolus fracture without any complications to the hardware.   PMFS History: Patient Active Problem List   Diagnosis Date Noted  . Colitis, acute 04/09/2018  . Chronic diastolic CHF (congestive heart  failure) ([place]) 03/09/2018  . Fall 03/08/2018  . Displaced fracture of distal end of right fibula 03/08/2018  . GIB (gastrointestinal bleeding) 02/09/2018  . Obesity due to excess calories 03/20/2017  . Cardiomyopathy ([employer]) 01/24/2017  . Sinus congestion 01/24/2017  . 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 Chemotherapy-induced nausea 10/24/2016  . Goals of care, counseling/discussion 09/03/2016  . Thrombocytopenia ([place]) 04/15/2016  . Hot flashes due to tamoxifen 04/15/2016  . Vasomotor instability 01/17/2016  . Osteopenia determined by x-ray 12/22/2015  . Encounter for antineoplastic chemotherapy 12/09/2015  . Chemotherapy-induced neuropathy ([place]) 10/01/2015  . Elevated tumor markers 10/01/2015  . Obesity (BMI 35.0-39.9 without comorbidity) 05/31/2015  . Port catheter in place 03/29/2015  . Iron deficiency anemia 03/05/2015  . Low back pain 03/05/2015  . Neuropathic pain of foot 11/21/2014  . Diabetes mellitus without complication ([place]) 29/47/6546  . Portacath in place 05/10/2014  . Leukopenia due to antineoplastic chemotherapy ([place]) 05/10/2014  . Splenomegaly 05/10/2014  . Primary peritoneal carcinomatosis ([place]) 05/08/2014  . Genetic testing 03/16/2014  . Neutropenic fever ([place]) 01/27/2014  . Pancytopenia, acquired ([place]) 01/27/2014  . Fever and neutropenia ([place]) 01/27/2014  . Asthma 01/27/2014  . Malignant ascites 01/25/2014  . Chemotherapy induced neutropenia ([place]) 01/25/2014  . Chemotherapy induced thrombocytopenia 01/25/2014  . Restless legs syndrome 01/25/2014  . Liver cirrhosis secondary to NASH ([place]) 01/02/2014  . Disseminated ovarian cancer (  [place]) 12/03/2013  . 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 Ascites 11/15/2013  . Elevated cancer antigen 125 (CA-125) 11/15/2013  . Cough variant asthma vs UACS 09/02/2013  . Essential hypertension 09/02/2013  . Dyspnea 10/22/2012  . Fatty liver disease, nonalcoholic  78/29/5621  . S/P cardiac cath 11/06/2010   Past Medical History:  Diagnosis Date  . Asthma   . Cirrhosis ([place])   . Complication of anesthesia    slow to wake up / n & v  . Diabetes mellitus   . Elevated LFTs   . Family history of adverse reaction to anesthesia    multiple family members have had problems such as slow to wake / "flat lined" /  ventilator    . Frequency of urination   . Glaucoma, narrow-angle   . History of skin cancer   . Hypertension   . Non-ST elevated myocardial infarction (non-STEMI) [facility]) Sept 2012   Mild elevation in troponin and CK MB but no evidence of coronary disease at time of cath  . Obesity   . Ovarian cancer ([place]) 11/22/2013   Disseminated ovarian cancer  . Overactive bladder   . PONV (postoperative nausea and vomiting)   . Portal hypertension ([place])   . Restless leg syndrome   . Tingling    hands and feet    Family History  Problem Relation Age of Onset  . Asthma Mother   . Diabetes Mother   . Hypertension Mother   . Allergies Mother   . Rheum arthritis Mother   . Bladder Cancer Father 16       former heavy smoker  . Hypertension Father   . Stroke Father   . Breast cancer Sister 40  . Diabetes Sister        x 3  . Allergies Sister   . Asthma Sister        x 2   . Breast cancer Maternal Aunt 85  . Breast cancer Cousin 31       (maternal) bilateral cancer  . Aneurysm Son 94  . Diabetes Brother   . Allergies Brother   . Asthma Brother     Past Surgical History:  Procedure Laterality Date  . ABDOMINAL ULTRASOUND  Sept 2012   No gallbladder disease, + fatty liver  . CARDIAC CATHETERIZATION  10/25/2010   EF 60%; Normal coronaries  . COLONOSCOPY  10/18/2005  . COSMETIC SURGERY  2003  . DENTAL SURGERY    . ESOPHAGEAL BANDING  02/10/2018   Procedure: ESOPHAGEAL BANDING;  Surgeon: Ronnette Juniper, MD;  Location[facility];  Service: Gastroenterology;;  . ESOPHAGEAL BANDING N/A 03/23/2018   Procedure: ESOPHAGEAL BANDING;  Surgeon:  Ronnette Juniper, MD;  Location: [facility];  Service: Gastroenterology;  Laterality: N/A;  . ESOPHAGOGASTRODUODENOSCOPY N/A 03/23/2018   Procedure: ESOPHAGOGASTRODUODENOSCOPY (EGD);  Surgeon: Ronnette Juniper, MD;  Location: [facility];  Service: Gastroenterology;  Laterality: N/A;  . ESOPHAGOGASTRODUODENOSCOPY (EGD) WITH PROPOFOL N/A 02/10/2018   Procedure: ESOPHAGOGASTRODUODENOSCOPY (EGD) WITH PROPOFOL;  Surgeon: Ronnette Juniper, MD;  Location: [facility];  Service: Gastroenterology;  Laterality: N/A;  . EYE SURGERY Bilateral 09/19/14 R eye, 08/2014 L eye   open angles in both eyes d/t narrow angle glaucoma  . LAPAROTOMY N/A 03/29/2014   Procedure: EXPLORATORY LAPAROTOMY;  Surgeon: Everitt Amber, MD;  Location: [facility];  Service: Gynecology;  Laterality: N/A;  . ORIF ANKLE FRACTURE Right 03/09/2018   Procedure: OPEN REDUCTION INTERNAL FIXATION (ORIF) RIGHT LATERAL MALLEOLUS;  Surgeon: Leandrew Koyanagi,  MD;  Location: [facility];  Service: Orthopedics;  Laterality: Right;  . PARTIAL HYSTERECTOMY  1988  . SALPINGOOPHORECTOMY Bilateral 03/29/2014   Procedure: BILATERAL SALPINGO OOPHORECTOMY, OMENTECTOMY, DEBULKING;  Surgeon: Everitt Amber, MD;  Location: [facility];  Service: Gynecology;  Laterality: Bilateral;  . TONSILLECTOMY  1971  . TUBAL LIGATION    . [facility]  11/24/2009   Social History   Occupational History  . Occupation: Retired  Tobacco Use  . Smoking status: Former Smoker    Packs/day: 1.00    Years: 1.00    Pack years: 1.00    Types: Cigarettes    Last attempt to quit: 02/11/1982    Years since quitting: 36.3  . Smokeless tobacco: Never Used  Substance and Sexual Activity  . Alcohol use: No    Alcohol/week: 0.0 standard drinks  . Drug use: No  . Sexual activity: Not on file

## 2018-06-01 ENCOUNTER — Encounter (HOSPITAL_COMMUNITY): Admission: RE | Payer: Self-pay | Source: Home / Self Care

## 2018-06-01 ENCOUNTER — Ambulatory Visit (HOSPITAL_COMMUNITY): Admission: RE | Admit: 2018-06-01 | Payer: Medicare Other | Source: Home / Self Care | Admitting: Gastroenterology

## 2018-06-01 SURGERY — EGD (ESOPHAGOGASTRODUODENOSCOPY)
Anesthesia: Monitor Anesthesia Care

## 2018-06-02 ENCOUNTER — Other Ambulatory Visit: Payer: Self-pay | Admitting: Hematology and Oncology

## 2018-07-08 ENCOUNTER — Ambulatory Visit: Payer: Medicare Other | Admitting: Neurology

## 2018-07-09 ENCOUNTER — Encounter: Payer: Self-pay | Admitting: Hematology and Oncology

## 2018-07-09 ENCOUNTER — Inpatient Hospital Stay: Payer: Medicare Other

## 2018-07-09 ENCOUNTER — Inpatient Hospital Stay: Payer: Medicare Other | Attending: Hematology and Oncology

## 2018-07-09 ENCOUNTER — Inpatient Hospital Stay (HOSPITAL_BASED_OUTPATIENT_CLINIC_OR_DEPARTMENT_OTHER): Payer: Medicare Other | Admitting: Hematology and Oncology

## 2018-07-09 ENCOUNTER — Other Ambulatory Visit: Payer: Self-pay

## 2018-07-09 DIAGNOSIS — Z9221 Personal history of antineoplastic chemotherapy: Secondary | ICD-10-CM | POA: Insufficient documentation

## 2018-07-09 DIAGNOSIS — Z8589 Personal history of malignant neoplasm of other organs and systems: Secondary | ICD-10-CM | POA: Diagnosis present

## 2018-07-09 DIAGNOSIS — D72819 Decreased white blood cell count, unspecified: Secondary | ICD-10-CM

## 2018-07-09 DIAGNOSIS — D696 Thrombocytopenia, unspecified: Secondary | ICD-10-CM

## 2018-07-09 DIAGNOSIS — Z794 Long term (current) use of insulin: Secondary | ICD-10-CM

## 2018-07-09 DIAGNOSIS — D509 Iron deficiency anemia, unspecified: Secondary | ICD-10-CM

## 2018-07-09 DIAGNOSIS — Z79899 Other long term (current) drug therapy: Secondary | ICD-10-CM

## 2018-07-09 DIAGNOSIS — M545 Low back pain, unspecified: Secondary | ICD-10-CM

## 2018-07-09 DIAGNOSIS — C482 Malignant neoplasm of peritoneum, unspecified: Secondary | ICD-10-CM

## 2018-07-09 DIAGNOSIS — G8929 Other chronic pain: Secondary | ICD-10-CM

## 2018-07-09 DIAGNOSIS — C569 Malignant neoplasm of unspecified ovary: Secondary | ICD-10-CM

## 2018-07-09 LAB — CBC WITH DIFFERENTIAL/PLATELET
Abs Immature Granulocytes: 0.02 10*3/uL (ref 0.00–0.07)
Basophils Absolute: 0 10*3/uL (ref 0.0–0.1)
Basophils Relative: 0 %
Eosinophils Absolute: 0.1 10*3/uL (ref 0.0–0.5)
Eosinophils Relative: 2 %
HCT: 38.5 % (ref 36.0–46.0)
Hemoglobin: 12.4 g/dL (ref 12.0–15.0)
Immature Granulocytes: 0 %
Lymphocytes Relative: 26 %
Lymphs Abs: 1.2 10*3/uL (ref 0.7–4.0)
MCH: 30.8 pg (ref 26.0–34.0)
MCHC: 32.2 g/dL (ref 30.0–36.0)
MCV: 95.5 fL (ref 80.0–100.0)
Monocytes Absolute: 0.3 10*3/uL (ref 0.1–1.0)
Monocytes Relative: 6 %
Neutro Abs: 2.9 10*3/uL (ref 1.7–7.7)
Neutrophils Relative %: 66 %
Platelets: 79 10*3/uL — ABNORMAL LOW (ref 150–400)
RBC: 4.03 MIL/uL (ref 3.87–5.11)
RDW: 14.5 % (ref 11.5–15.5)
WBC: 4.5 10*3/uL (ref 4.0–10.5)
nRBC: 0 % (ref 0.0–0.2)

## 2018-07-09 LAB — COMPREHENSIVE METABOLIC PANEL
ALT: 38 U/L (ref 0–44)
AST: 34 U/L (ref 15–41)
Albumin: 3.8 g/dL (ref 3.5–5.0)
Alkaline Phosphatase: 132 U/L — ABNORMAL HIGH (ref 38–126)
Anion gap: 8 (ref 5–15)
BUN: 22 mg/dL (ref 8–23)
CO2: 25 mmol/L (ref 22–32)
Calcium: 9.2 mg/dL (ref 8.9–10.3)
Chloride: 108 mmol/L (ref 98–111)
Creatinine, Ser: 0.92 mg/dL (ref 0.44–1.00)
GFR calc Af Amer: >60 mL/min (ref >60)
GFR calc non Af Amer: >60 mL/min (ref >60)
Glucose, Bld: 155 mg/dL — ABNORMAL HIGH (ref 70–99)
Potassium: 4.3 mmol/L (ref 3.5–5.1)
Sodium: 141 mmol/L (ref 135–145)
Total Bilirubin: 0.8 mg/dL (ref 0.3–1.2)
Total Protein: 7.6 g/dL (ref 6.5–8.1)

## 2018-07-09 LAB — IRON AND TIBC
Iron: 75 ug/dL (ref 41–142)
Saturation Ratios: 22 % (ref 21–57)
TIBC: 334 ug/dL (ref 236–444)
UIBC: 259 ug/dL (ref 120–384)

## 2018-07-09 LAB — FERRITIN: Ferritin: 79 ng/mL (ref 11–307)

## 2018-07-09 MED ORDER — PANTOPRAZOLE SODIUM 40 MG PO TBEC: 40.0000 mg | DELAYED_RELEASE_TABLET | Freq: Every day | ORAL | Status: AC

## 2018-07-09 MED ORDER — HEPARIN SOD (PORK) LOCK FLUSH 100 UNIT/ML IV SOLN
500.0000 [IU] | Freq: Once | INTRAVENOUS | Status: AC
  Administered 2018-07-09: 08:00:00 500 [IU]
  Filled 2018-07-09: qty 5

## 2018-07-09 MED ORDER — HEPARIN SOD (PORK) LOCK FLUSH 100 UNIT/ML IV SOLN
250.0000 [IU] | Freq: Once | INTRAVENOUS | Status: DC
  Filled 2018-07-09: qty 5

## 2018-07-09 MED ORDER — HYDROCODONE-ACETAMINOPHEN 5-325 MG PO TABS: 1.0000 | ORAL_TABLET | Freq: Four times a day (QID) | ORAL | 0 refills | Status: DC | PRN

## 2018-07-09 MED ORDER — SODIUM CHLORIDE 0.9% FLUSH
10.0000 mL | Freq: Once | INTRAVENOUS | Status: AC
  Administered 2018-07-09: 10 mL
  Filled 2018-07-09: qty 10

## 2018-07-09 NOTE — Assessment & Plan Note (Signed)
Clinically, she has no signs or symptoms to suggest cancer recurrence Tumor marker is pending If her Ca1 25 is stable, I plan to see her again in 8 weeks with further follow-up However, if her tumor marker is abnormally high, we will order imaging study and plan accordingly

## 2018-07-09 NOTE — Assessment & Plan Note (Signed)
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 She is not symptomatic.  Recommend close observation only

## 2018-07-09 NOTE — Assessment & Plan Note (Signed)
The patient have chronic joint pain and arthritis He is prescribed gabapentin along with hydrocodone as needed We will continue the same and I have refilled her prescriptions

## 2018-07-09 NOTE — Assessment & Plan Note (Signed)
She is not anemic and her iron studies are adequate Observe only

## 2018-07-09 NOTE — Progress Notes (Signed)
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 She is not symptomatic.  Recommend close observation only  Low back pain The patient have chronic joint pain and arthritis He is prescribed gabapentin along with hydrocodone as needed We will continue the same and I have refilled her prescriptions   No orders of the defined types were placed in this encounter.   INTERVAL HISTORY: Please see below for problem oriented charting. She returns for further follow-up She has completed antibiotics for C. 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  No worrisome liver mass.  There are associated portal venous collaterals and splenomegaly consistent with portal venous hypertension. 2.  No other significant upper abdominal findings    11/08/2013 Imaging    US showed ascites    11/08/2013 Initial Diagnosis    Patient had ~ 2 months of some urinary incontinence, saw Dr Ronita Hipps in 10-2013, with pelvic  mass on exam     11/12/2013 Imaging    There are findings of progressive hepatic cirrhosis with a large amount of ascites and mild splenomegaly. There are venous collaterals present. 2. There is abnormal thickening of the peritoneal surface along the left mid and lower abdominal wall. There is abnormal soft tissue density in the pelvis as well which could reflect the clinically suspected ovarian malignancy but the findings are nondiagnostic. 3. There is a new small left pleural effusion. 4. There is no acute bowel abnormality.    11/18/2013 Imaging    Successful ultrasound guided diagnostic and therapeutic paracentesis yielding 4.1 liters of ascites.     11/18/2013 Pathology Results    PERITONEAL/ASCITIC FLUID (SPECIMEN 1 OF 1 COLLECTED 11/18/13): SEROUS CARCINOMA, PLEASE SEE COMMENT.    12/01/2013 Tumor Marker    Patient's tumor was tested for the following markers: CA125 Results of the tumor marker test revealed 1938    12/03/2013 Imaging    Successful ultrasound-guided therapeutic paracentesis yielding 3.6 liters of peritoneal fluid.    12/07/2013 - 05/30/2014 Chemotherapy    She received 3 cycles of carboplatin and Taxol, interrupted cycle 4 for surgery.  She subsequently completed 3 more cycles of chemotherapy after surgery    12/20/2013 Imaging    Successful ultrasound-guided diagnostic and therapeutic paracentesis yielding 2.2 liters of peritoneal fluid    12/20/2013 Pathology Results    PERITONEAL/ASCITIC FLUID(SPECIMEN 1 OF 1 COLLECTED 12/20/13): MALIGNANT CELLS CONSISTENT WITH SEROUS CARCINOMA    01/13/2014 Tumor Marker    Patient's tumor was tested for the following markers: CA125 Results of the tumor marker test revealed 227  02/07/2014 Tumor Marker    Patient's tumor was tested for the following markers: CA125 Results of the tumor marker test revealed 130    02/15/2014 Genetic Testing    Genetics testing from 02-2014 normal ([employer] panel)    02/23/2014 Imaging     Interval decrease in ascites. 2. Morphologic changes in the liver consistent with cirrhosis. 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 Portal vein remains patent at this time. 3. Persistent but decreased abnormal soft tissue attenuation tracking in the omental fat. This may be secondary to interval improvement in metastatic disease. 4. Peritoneal thickening seen in the left abdomen and pelvis on the previous study has decreased and nearly resolved in the interval. This also suggests interval improvement in metastatic disease.     03/07/2014 Procedure    Ultrasound and fluoroscopically guided right internal jugular single lumen power port catheter insertion. Tip in the SVC/RA junction. Catheter ready for use.    03/17/2014 Tumor Marker    Patient's tumor was tested for the following markers: CA125 Results of the tumor marker test revealed 45    03/29/2014 Pathology Results    1. Omentum, resection for tumor - HIGH GRADE SEROUS CARCINOMA, SEE COMMENT. 2. Ovary and fallopian tube, right - HIGH GRADE SEROUS CARCINOMA, SEE COMMENT. 3. Ovary and fallopian tube, left - HIGH GRADE SEROUS CARCINOMA, SEE COMMENT. Diagnosis Note 1. Nests and clusters of malignant cells are invading the omental tissue (part #1) with associated fibrosis. The cells are pleomorphic with prominent nucleoli. There are scattered psammoma bodies. The ovaries are atrophic and exhibit multiple foci of surface based invasive carcinoma and associated fibrosis. The fallopian tubes have a few foci of carcinoma, also superficially located. There are no precursor lesions noted in the ovaries or fallopian tubes (the entire tubes and ovaries were submitted for evaluation). While there is some retraction artifact, there are several foci suspicious for lymphovascular invasion. 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 Given the fibrosis, there does appear to be a small amount of treatment effect, however, there is abundant residual tumor.     03/29/2014 Surgery    Procedure(s) Performed: 1. Exploratory laparotomy with bilateral salpingo-oophorectomy, omentectomy radical tumor debulking for ovarian cancer .  Surgeon: Thereasa Solo, MD.  Assistant Surgeon: Lahoma Crocker, M.D. Assistant: (an MD assistant was necessary for tissue manipulation, retraction and positioning due to the complexity of the case and hospital policies).  Operative Findings: 10cm omental cake from hepatic to splenic flexure, densely adherent to transverse colon. Milliary studding of tumor implants (<50m, too numerous in number to count) adherent to the mesentery of the small bowel and small bowel wall. Small volume (100cc) ascites. Small ovaries bilaterally, densely adherent to the pelvic cul de sac. Left ovary and tube densely adherent to sigmoid colon. Cirrhotic liver with hepatomegaly and splenomegaly.     This represented an optimal cytoreduction (R1) with visible disease residual on the bowel wall and mesentery (millial, <346mimplants).      04/18/2014 Tumor Marker    Patient's tumor was tested for the following markers: CA125 Results of the tumor marker test revealed 122    05/16/2014 Tumor Marker    Patient's tumor was tested for the following markers: CA125 Results of the tumor marker test revealed 30    06/10/2014 Tumor Marker    Patient's tumor was tested for the following markers:  CA125 Results of the tumor marker test revealed 19    07/04/2014 Imaging    Interval improvement in the appearance of peritoneal metastasis secondary to ovarian cancer. 2. 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     08/18/2014 Tumor Marker    Patient's tumor was tested for the following markers: CA125 Results of the tumor marker test revealed 16    10/13/2014 Tumor Marker     Patient's tumor was tested for the following markers: CA125 Results of the tumor marker test revealed 14    11/21/2014 Tumor Marker    Patient's tumor was tested for the following markers: CA125 Results of the tumor marker test revealed 16    12/28/2014 Tumor Marker    Patient's tumor was tested for the following markers: CA125 Results of the tumor marker test revealed 762    02/27/2015 Tumor Marker    Patient's tumor was tested for the following markers: CA125 Results of the tumor marker test revealed 20.2    03/22/2015 Imaging    Filler appearance to the prior exam, with very subtle fluid tracking along portions of the liver, and very minimal nodularity along the right paracolic gutter representing residua from the prior peritoneal cancer. The current abnormalities could simply be post therapy findings rather than necessarily representing residual malignancy. No new or enlarging lesions are identified. 2. Hepatic cirrhosis and splenomegaly. There is some gastric varices suggesting portal venous hypertension. 3. Left foraminal stenosis at L4-5 due to spurring. There is likely also some central narrowing of the thecal sac at this level. 4. Bibasilar scarring. 5. Chronic mild left mid kidney scarring    03/27/2015 Tumor Marker    Patient's tumor was tested for the following markers: CA125 Results of the tumor marker test revealed 25.3    04/24/2015 Imaging    Disc bulge L2-3 with mild spinal stenosis and right foraminal encroachment. 2. Disc bulge L3-4 with mild spinal stenosis. 3. Multifactorial spinal, left lateral recess and foraminal stenosis L4-5. There is grade 1 anterolisthesis without evident dynamic instability.    05/29/2015 Tumor Marker    Patient's tumor was tested for the following markers: CA125 Results of the tumor marker test revealed 29.9    08/21/2015 Tumor Marker    Patient's tumor was tested for the following markers: CA125 Results of the tumor marker test revealed 45     09/18/2015 Tumor Marker    Patient's tumor was tested for the following markers: CA125 Results of the tumor marker test revealed 47    09/27/2015 Imaging    New small bowel mesenteric nodule and enlarged left external iliac lymph node, highly worrisome for metastatic disease. 2. Cirrhosis and splenomegaly    10/10/2015 Tumor Marker    Patient's tumor was tested for the following markers: CA125 Results of the tumor marker test revealed 53.4    10/10/2015 - 12/11/2015 Chemotherapy    She received 4 cycles of carboplatin only    11/20/2015 Tumor Marker    Patient's tumor was tested for the following markers: CA125 Results of the tumor marker test revealed 41.3    12/20/2015 Imaging    CT: Mild left external iliac lymphadenopathy is slightly decreased. Mildly enlarged lower mesenteric nodule is slightly decreased. 2. No new or progressive metastatic disease in the abdomen or pelvis. 3. Cirrhosis. Stable subcentimeter hypodense left liver lobe lesion. No new liver lesions. 4. Stable mild splenomegaly.  No ascites. 5. Mild sigmoid diverticulosis.    01/01/2016 -  Anti-estrogen oral therapy  She was placed on tamoxifen. Had letrozole initially but switched to tamoxifen due to poor tolerance    01/15/2016 Tumor Marker    Patient's tumor was tested for the following markers: CA125 Results of the tumor marker test revealed 31.4    02/19/2016 Tumor Marker    Patient's tumor was tested for the following markers: CA125 Results of the tumor marker test revealed 36.5    05/13/2016 Imaging    No evidence of disease progression within the abdomen or pelvis. Previously noted small central mesenteric nodule and left external iliac node appear slightly smaller. 2. Stable changes of hepatic cirrhosis and portal hypertension with associated splenomegaly. No new or enlarging hepatic lesions are identified. 3. No acute findings.    05/13/2016 Tumor Marker    Patient's tumor was tested for the following markers:  CA125 Results of the tumor marker test revealed 41.2    06/24/2016 Tumor Marker    Patient's tumor was tested for the following markers: CA125 Results of the tumor marker test revealed 47.3    08/20/2016 Imaging    1. The left external iliac lymph node is slightly increased in size compared to the prior exam, currently 1.1 cm and previously 0.9 cm in diameter. 2. Stable central mesenteric lymph node at 0.9 cm in diameter. 3. Stable trace free pelvic fluid and stable slight thickening along the right paracolic gutter, without well-defined peritoneal nodularity. 4. Other imaging findings of potential clinical significance: Subsegmental atelectasis or scarring in the lung bases. Hepatic cirrhosis. Left renal scarring. Mild splenomegaly. Sigmoid colon diverticulosis. Impingement at L4-5 due to spondylosis and degenerative disc disease broad Schmorl' s nodes at L3-L4. Pelvic floor laxity    08/23/2016 Tumor Marker    Patient's tumor was tested for the following markers: CA125 Results of the tumor marker test revealed 80.2    09/04/2016 Imaging    LV EF: 60% -   65%    09/12/2016 Tumor Marker    Patient's tumor was tested for the following markers: CA125 Results of the tumor marker test revealed 98.5    09/12/2016 - 01/29/2018 Chemotherapy    The patient received Doxil and Avastin. Avastin was discontinued in Dec 2019 due to GI hemorrhage    09/26/2016 Tumor Marker    Patient's tumor was tested for the following markers: CA125 Results of the tumor marker test revealed 68.9    10/10/2016 Tumor Marker    Patient's tumor was tested for the following markers: CA125 Results of the tumor marker test revealed 65.6    11/07/2016 Tumor Marker    Patient's tumor was tested for the following markers: CA125 Results of the tumor marker test revealed 46.4    11/29/2016 Imaging    Stable mild peritoneal thickening, suspicious for peritoneal carcinomatosis. No new or progressive disease identified. No  evidence of ascites.  No significant change in 10 mm left external iliac and 8 mm mesenteric lymph nodes.  Stable hepatic cirrhosis, and splenomegaly consistent with portal venous hypertension.  Colonic diverticulosis, without radiographic evidence of diverticulitis.    12/02/2016 Imaging    Normal LV size with EF 55%. Basal inferior and basal inferoseptal hypokinesis. Normal RV size and systolic function. No significant valvular abnormalities.    12/05/2016 Tumor Marker    Patient's tumor was tested for the following markers: CA125 Results of the tumor marker test revealed 49.1    01/09/2017 Tumor Marker    Patient's tumor was tested for the following markers: CA125 Results of the tumor marker test revealed  47.2    02/06/2017 Tumor Marker    Patient's tumor was tested for the following markers: CA125 Results of the tumor marker test revealed 44.1    02/18/2017 Imaging    Stable mild peritoneal thickening. No new or progressive disease identified within the abdomen or pelvis.  Stable tiny sub-cm left external iliac and mesenteric lymph nodes.  Stable hepatic cirrhosis, and splenomegaly consistent with portal venous hypertension. No evidence of hepatic neoplasm.  Colonic diverticulosis, without radiographic evidence of diverticulitis.    03/06/2017 Tumor Marker    Patient's tumor was tested for the following markers: CA125 Results of the tumor marker test revealed 50.4    03/17/2017 Imaging    - Left ventricle: The cavity size was normal. Wall thickness was normal. Systolic function was normal. The estimated ejection fraction was in the range of 55% to 60%. Wall motion was normal; there were no regional wall motion abnormalities. Features are consistent with a pseudonormal left ventricular filling pattern, with concomitant abnormal relaxation and increased filling pressure (grade 2 diastolic dysfunction). - Mitral valve: There was mild regurgitation. - Left atrium: The atrium was  mildly dilated    04/03/2017 Tumor Marker    Patient's tumor was tested for the following markers: CA125 Results of the tumor marker test revealed 47.2    05/14/2017 Imaging    CT scan of abdomen and pelvis Stable mild peritoneal thickening. No new or progressive disease within the abdomen or pelvis.  Stable hepatic cirrhosis. Stable splenomegaly, consistent with portal venous hypertension. No evidence of hepatic neoplasm.    05/15/2017 Tumor Marker    Patient's tumor was tested for the following markers: CA125 Results of the tumor marker test revealed 56.1    06/12/2017 Tumor Marker    Patient's tumor was tested for the following markers: CA125 Results of the tumor marker test revealed 49.2    07/10/2017 Tumor Marker    Patient's tumor was tested for the following markers: CA125 Results of the tumor marker test revealed 46.3    08/07/2017 Tumor Marker    Patient's tumor was tested for the following markers: CA125 Results of the tumor marker test revealed 52.9    08/20/2017 Imaging    1. Mild omental/peritoneal haziness, unchanged. No evidence of new metastatic disease. 2. Cirrhosis with splenomegaly.    09/04/2017 Tumor Marker    Patient's tumor was tested for the following markers: CA125 Results of the tumor marker test revealed 48.5    11/13/2017 Tumor Marker    Patient's tumor was tested for the following markers: CA125 Results of the tumor marker test revealed 55.8    12/17/2017 Imaging    1. No findings to suggest metastatic disease in the abdomen or pelvis. 2. Severe hepatic cirrhosis with evidence of portal hypertension, as demonstrated by dilated portal vein, splenomegaly and portosystemic collateral pathways, including gastric and esophageal varices.  3. Colonic diverticulosis without evidence of acute diverticulitis at this time. 4. Additional incidental findings, as above.    01/16/2018 Tumor Marker    Patient's tumor was tested for the following markers: CA125 Results  of the tumor marker test revealed 63.7    02/10/2018 Procedure    EGD - Recently bleeding grade III and large (> 5 mm) esophageal varices. Completely eradicated. Banded. - Portal hypertensive gastropathy. - Normal examined duodenum. - No specimens collected    02/13/2018 Tumor Marker    Patient's tumor was tested for the following markers: CA125 Results of the tumor marker test revealed 82.7  02/25/2018 Imaging    Bone density showed mild osteopenia    04/08/2018 Tumor Marker    Patient's tumor was tested for the following markers: CA125 Results of the tumor marker test revealed 82.7    04/08/2018 Imaging    1. No definite omental or peritoneal surface lesions. However, there are 2 slowly enlarging lymph nodes noted. One is in the small bowel mesentery and the other is in the left deep pelvis. Could not exclude recurrent disease. 2. Stable advanced cirrhotic changes involving the liver with portal venous hypertension, portal venous collaterals, esophageal varices and splenomegaly. No worrisome hepatic lesions. 3. Diffuse wall thickening of the colon could suggest diffuse colitis or could be due to low albumin. Recommend correlation with any symptoms such as diarrhea.    05/21/2018 Tumor Marker    Patient's tumor was tested for the following markers: CA125 Results of the tumor marker test revealed 86     REVIEW OF SYSTEMS:   Constitutional: Denies fevers, chills or abnormal weight loss Eyes: Denies blurriness of vision Ears, nose, mouth, throat, and face: Denies mucositis or sore throat Respiratory: Denies cough, dyspnea or wheezes Cardiovascular: Denies palpitation, chest discomfort or lower extremity swelling Gastrointestinal:  Denies nausea, heartburn or change in bowel habits Skin: Denies abnormal skin rashes Lymphatics: Denies new lymphadenopathy or easy bruising Neurological:Denies numbness, tingling or new weaknesses Behavioral/Psych: Mood is stable, no new changes  All  other systems were reviewed with the patient and are negative.  I have reviewed the past medical history, past surgical history, social history and family history with the patient and they are unchanged from previous note.  ALLERGIES:  is allergic to shellfish allergy; benadryl [diphenhydramine hcl]; penicillins; and tape.  MEDICATIONS:  Current Outpatient Medications  Medication Sig Dispense Refill  . cholecalciferol (VITAMIN D) 1000 units tablet Take 1,000 Units by mouth at bedtime.     . gabapentin (NEURONTIN) 300 MG capsule TAKE 2 CAPSULES(600 MG) BY MOUTH TWICE DAILY 120 capsule 11  . hydrochlorothiazide (MICROZIDE) 12.5 MG capsule Take 12.5 mg by mouth daily after breakfast.   1  . HYDROcodone-acetaminophen (NORCO/VICODIN) 5-325 MG tablet Take 1 tablet by mouth every 6 (six) hours as needed for moderate pain. 60 tablet 0  . insulin degludec (TRESIBA FLEXTOUCH) 100 UNIT/ML SOPN FlexTouch Pen Inject 18 Units into the skin daily after breakfast.     . lidocaine-prilocaine (EMLA) cream Apply to Porta-cath  1-2 hours prior to access as directed. (Patient taking differently: Apply 1 application topically daily as needed (port access). ) 30 g 9  . nadolol (CORGARD) 20 MG tablet Take 1 tablet (20 mg total) by mouth daily. (Patient taking differently: Take 20 mg by mouth at bedtime. ) 90 tablet 3  . pantoprazole (PROTONIX) 40 MG tablet Take 1 tablet (40 mg total) by mouth daily.    . rotigotine (NEUPRO) 4 MG/24HR APPLY 1 PATCH EXTERNALLY TO THE SKIN DAILY 30 patch 5   No current facility-administered medications for this visit.     PHYSICAL EXAMINATION: ECOG PERFORMANCE STATUS: 1 - Symptomatic but completely ambulatory  Vitals:   07/09/18 0911  BP: (!) 151/61  Pulse: 81  Resp: 18  Temp: 98.7 F (37.1 C)  SpO2: 100%   Filed Weights   07/09/18 0911  Weight: 176 lb 9.6 oz (80.1 kg)    GENERAL:alert, no distress and comfortable SKIN: skin color, texture, turgor are normal, no rashes  or significant lesions EYES: normal, Conjunctiva are pink and non-injected, sclera clear OROPHARYNX:no exudate, no  erythema and lips, buccal mucosa, and tongue normal  NECK: supple, thyroid normal size, non-tender, without nodularity LYMPH:  no palpable lymphadenopathy in the cervical, axillary or inguinal LUNGS: clear to auscultation and percussion with normal breathing effort HEART: regular rate & rhythm and no murmurs and no lower extremity edema ABDOMEN:abdomen soft, non-tender and normal bowel sounds Musculoskeletal:no cyanosis of digits and no clubbing  NEURO: alert & oriented x 3 with fluent speech, no focal motor/sensory deficits  LABORATORY DATA:  I have reviewed the data as listed    Component Value Date/Time   NA 141 07/09/2018 0810   NA 142 02/06/2017 0742   K 4.3 07/09/2018 0810   K 3.9 02/06/2017 0742   CL 108 07/09/2018 0810   CO2 25 07/09/2018 0810   CO2 22 02/06/2017 0742   GLUCOSE 155 (H) 07/09/2018 0810   GLUCOSE 156 (H) 02/06/2017 0742   BUN 22 07/09/2018 0810   BUN 13.5 02/06/2017 0742   CREATININE 0.92 07/09/2018 0810   CREATININE 0.8 02/06/2017 0742   CALCIUM 9.2 07/09/2018 0810   CALCIUM 8.5 02/06/2017 0742   PROT 7.6 07/09/2018 0810   PROT 6.9 02/06/2017 0742   ALBUMIN 3.8 07/09/2018 0810   ALBUMIN 3.7 02/06/2017 0742   AST 34 07/09/2018 0810   AST 47 (H) 02/06/2017 0742   ALT 38 07/09/2018 0810   ALT 54 02/06/2017 0742   ALKPHOS 132 (H) 07/09/2018 0810   ALKPHOS 130 02/06/2017 0742   BILITOT 0.8 07/09/2018 0810   BILITOT 0.65 02/06/2017 0742   GFRNONAA >60 07/09/2018 0810   GFRAA >60 07/09/2018 0810    No results found for: SPEP, UPEP  Lab Results  Component Value Date   WBC 4.5 07/09/2018   NEUTROABS 2.9 07/09/2018   HGB 12.4 07/09/2018   HCT 38.5 07/09/2018   MCV 95.5 07/09/2018   PLT 79 (L) 07/09/2018      Chemistry      Component Value Date/Time   NA 141 07/09/2018 0810   NA 142 02/06/2017 0742   K 4.3 07/09/2018 0810   K  3.9 02/06/2017 0742   CL 108 07/09/2018 0810   CO2 25 07/09/2018 0810   CO2 22 02/06/2017 0742   BUN 22 07/09/2018 0810   BUN 13.5 02/06/2017 0742   CREATININE 0.92 07/09/2018 0810   CREATININE 0.8 02/06/2017 0742      Component Value Date/Time   CALCIUM 9.2 07/09/2018 0810   CALCIUM 8.5 02/06/2017 0742   ALKPHOS 132 (H) 07/09/2018 0810   ALKPHOS 130 02/06/2017 0742   AST 34 07/09/2018 0810   AST 47 (H) 02/06/2017 0742   ALT 38 07/09/2018 0810   ALT 54 02/06/2017 0742   BILITOT 0.8 07/09/2018 0810   BILITOT 0.65 02/06/2017 0742     All questions were answered. The patient knows to call the clinic with any problems, questions or concerns. No barriers to learning was detected.  I spent 15 minutes counseling the patient face to face. The total time spent in the appointment was 20 minutes and more than 50% was on counseling and review of test results  [name], MD 07/09/2018 12:30 PM

## 2018-07-10 LAB — CA 125: Cancer Antigen (CA) 125: 79.8 U/mL — ABNORMAL HIGH (ref 0.0–38.1)

## 2018-07-14 ENCOUNTER — Telehealth: Payer: Self-pay | Admitting: Hematology and Oncology

## 2018-07-14 NOTE — Telephone Encounter (Signed)
Scheduled appt per sch msg. Called and spoke with patient. Confirmed date and time °

## 2018-07-22 ENCOUNTER — Ambulatory Visit: Payer: Medicare Other | Admitting: Neurology

## 2018-07-28 ENCOUNTER — Ambulatory Visit (INDEPENDENT_AMBULATORY_CARE_PROVIDER_SITE_OTHER): Payer: Medicare Other | Admitting: Orthopaedic Surgery

## 2018-07-28 ENCOUNTER — Other Ambulatory Visit: Payer: Self-pay

## 2018-07-28 ENCOUNTER — Encounter: Payer: Self-pay | Admitting: Orthopaedic Surgery

## 2018-07-28 DIAGNOSIS — S82831A Other fracture of upper and lower end of right fibula, initial encounter for closed fracture: Secondary | ICD-10-CM

## 2018-07-28 NOTE — Progress Notes (Signed)
Olivia Benson was seen today in neurologic consultation at the request of Dr. Marko Plume. Her PCP is Helane Rima, MD.  The consultation is for RLS.  Prior records were reviewed.  The patient also has a history of primary peritoneal carcinomatosis, grade 3.  She has undergone previous debulking surgery and chemotherapy with carboplatin and Taxol, which was last completed on 05/30/2014.  Her chemotherapy has been complicated by neutropenia, peripheral neuropathy, symptomatically anemia and fatigue.  She presents today to discuss restless leg syndrome.  Pt states that her sx's consist of a crawling sensation on the legs; she feels that she has a spider web on the leg; "I just have to move."  It may have started pre-chemo but got much worse after chemo.  It starts mid afternoon once she sits and "gets quiet" and tries to watch TV.    She has previously tried flexeril, baclofen, ultram, restoril and oxycontin.  The only one that has been helpful is oxyIR.  She has never been on requip, mirapex, levodopa, klonopin.    She admits that she craves ice.  She has been anemic.  Her last Hgb was 10.6.    11/25/14 update:  The patient is following up today in regards to her restless leg syndrome that is likely secondary to iron deficiency anemia.  Last visit, I started her on pramipexole, 0.125 mg after dinner and 0.25 mg at night. When she wakes up at night, she has "bone pain" but no RLS sx's.  She is still on one oxycodone at night for the pain but has dropped that from 2 tablets.   I was able to get a copy of her TSH from January, 2015 and it was normal at 1.820.  Dr. Marko Plume did start her on oral iron after our last visit but unfortunately the patient did not tolerate that because of nausea and it was recommended that the patient try instead a multivitamin with iron and she couldn't tolerate that either.  Said that she actually tolerated oral iron better than MVI.  Said that her ice craving went away.  05/24/15  update:  The patient follows up today in regards to restless leg syndrome.  She is on pramipexole, 0.125 mg after dinner and 0.25 mg at night.  She has been worse but isn't sure if that is because she is no longer on her oxycontin.  Admits she isn't taking her iron supplements.   She isn't sleeping at all now.  Eating ice again.  She did get married since our last visit, but reports that she has been with the same gentleman for a long time.  States that her weight and BS have both been going up and she is frustrated with that.  She has had some arthritic pain and the indocin helps but she had some stomach pain.  07/28/15 update:  The patient is seen today in follow-up for restless leg syndrome, which has been secondary to iron deficiency.  Last visit, she was complaining about worsening symptoms and increased her pramipexole 0.5 mg, so that she was taking a half a tablet after dinner and one tablet at night.  She complains that this medication makes her cough and causes SOB so she only takes 1/2 after dinner and 1/2 at night. Last visit, I  also checked her iron, and the percent saturation was low at 7% and total iron was low at 34.  I told her that she absolutely needed to restart her iron supplements.  She  states that she is taking this and "it isn't helping and my cravings for ice is increasing."  Admits that she overall isn't feeling good.  "I have pain all over."  No taking diabetes meds as prescribed.    11/27/15 update:  Pt f/u today for RLS.  She thought that mirapex caused SOB and cough and although that would be very unusual, we d/c that last visit and changed it to neupro patch.  I thought that her iron was likely low last visit and I rechecked it, and it indeed was low.  Through Dr. Marko Plume, IV iron was arranged for the patient.  She felt that she did amazing after the 2nd infusion of iron.  She is now off of the iron.  She is also off of the patch because she is in the donut hole.  She quit the IV  iron because she started chemo.  She is starting to crave ice again but not like she was last visit and not SOB like she was before last visit. The records that were made available to me were reviewed.  Unfortunately, pt had recurrence of her primary peritoneal carcinomatosis since our last visit.  Her chemo was restarted.  04/02/16 update: Patient seen today in follow-up.  She was given samples of the Neupro patch last visit, 1 mg for her restless leg.  She had repeat ferritin and iron studies on 02/19/2016 and were unremarkable.  States that she really was doing well until started on tamoxifen and then the RLS started acting up.  She is having cramping of the legs as well and has been taking a significant amount of advil (8-12 per day).  07/31/16 update:  Patient in today in follow-up.  Her Neupro patch was increased last visit her restless leg to 2 mg daily.  She states today that she didn't think that it was helping but she forgot it one day and then "I paced the floors and cried."  Has been f/u with Dr. Alvy Bimler and I reviewed those records.  No tumor progression.  Tumors markers fluctuated somewhat.  On tamoxifen.  She did get rid of her advil but is taking hydrocodone.  She is still craving ice but cannot tolerate the iron supplements so isn't taking supplements  12/04/16 update: Patient is seen today in follow-up.  Patient has a history of restless leg syndrome secondary to iron deficiency anemia.  She is on the Neupro patch, which was increased to 4 mg daily last visit.  "That is working better."  She forgot it one day and "I wanted to hurt someone."   Dr. Alvy Bimler ordered ferritin, iron and TIBC but I do not see that those have been completed yet.  Pt states that "I will have them done tomorrow because I am eating ice again."  Started chemo in August and RLS sx's got worse.  Eating so much ice that has separate ice machine.  Is taking much less ibuprofen now.    06/11/17 update: Patient is seen today in  follow-up.  She has restless leg secondary to iron deficiency anemia.  Most recent iron stores (ferritin/iron/TIBC) were done on May 15, 2017 and were normal.  She is on Neupro patch, 4 mg daily.  "I have really wiggly days and days its not so bad."  On not so bad days, she may not wear patch but sx's will come back.  No site reactions.  Only "bad reaction" is the cost.  Records have been reviewed since our last  visit.  She was last seen by Dr. Alvy Bimler on May 15, 2017.  She has had no signs of disease progression clinically with her peritoneal carcinomatosis.  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  It is felt that her intermittent leukopenia and chronic thrombocytopenia is likely due to liver disease/splenomegaly.  12/31/17 update: Patient seen today in follow-up for restless leg.  Patient is on the Neupro patch, 4 mg daily.  If she doesn't have the patch on, she knows it.  Last ferritin was done on December 17, 2017 and was low at 20.  This has consistently been dropping compared to 4 months ago.  Her hemoglobin is 11.3.  Records are reviewed.  She was seen by oncology/hematology on December 18, 2017.  Because of her symptoms of restless leg and reduction in ferritin, she did receive IV iron on November 7.  She is feeling somewhat better.  She is still eating ice but less.  She had broken another tooth from eating ice.    07/30/27 update: Patient seen today in follow-up for restless leg secondary to iron deficiency anemia.  She is on the Neupro patch, 4 mg daily.  "If I don't use the patch, I'm ready to kill myself."  Medical records are reviewed since last visit.  She had ferritin last checked on Jul 09, 2018 and this was normal at 53.  Iron studies were also normal.  fx her ankle going down garage stairs and had to have surgery in jan.  Records reviewed from [place].  Also had C. Diff since last visit related to abx.  States that she also had esophageal varicies since last  visit.  PREVIOUS MEDICATIONS: n/a  ALLERGIES:   Allergies  Allergen Reactions   Shellfish Allergy Anaphylaxis    HER THROAT SWELLS UP AND CLOSES   Benadryl [Diphenhydramine Hcl] Other (See Comments)    Restless legs    Penicillins Rash    DID THE REACTION INVOLVE: Swelling of the face/tongue/throat, SOB, or low BP? No Sudden or severe rash/hives, skin peeling, or the inside of the mouth or nose? No Did it require medical treatment? No When did it last happen?Childhood allergy If all above answers are NO, may proceed with cephalosporin use.    Tape Itching    Red itch with some tapes    CURRENT MEDICATIONS:  Outpatient Encounter Medications as of 07/30/2018  Medication Sig   cholecalciferol (VITAMIN D) 1000 units tablet Take 1,000 Units by mouth at bedtime.    gabapentin (NEURONTIN) 300 MG capsule TAKE 2 CAPSULES(600 MG) BY MOUTH TWICE DAILY   hydrochlorothiazide (MICROZIDE) 12.5 MG capsule Take 12.5 mg by mouth daily after breakfast.    HYDROcodone-acetaminophen (NORCO/VICODIN) 5-325 MG tablet Take 1 tablet by mouth every 6 (six) hours as needed for moderate pain.   insulin degludec (TRESIBA FLEXTOUCH) 100 UNIT/ML SOPN FlexTouch Pen Inject 18 Units into the skin daily after breakfast.    lidocaine-prilocaine (EMLA) cream Apply to Porta-cath  1-2 hours prior to access as directed. (Patient taking differently: Apply 1 application topically daily as needed (port access). )   nadolol (CORGARD) 20 MG tablet Take 1 tablet (20 mg total) by mouth daily. (Patient taking differently: Take 20 mg by mouth at bedtime. )   pantoprazole (PROTONIX) 40 MG tablet Take 1 tablet (40 mg total) by mouth daily.   rotigotine (NEUPRO) 4 MG/24HR APPLY 1 PATCH EXTERNALLY TO THE SKIN DAILY   No facility-administered encounter medications on file as of 07/30/2018.  PAST MEDICAL HISTORY:   Past Medical History:  Diagnosis Date   Asthma    Cirrhosis ([place])    Complication of  anesthesia    slow to wake up / n & v   Diabetes mellitus    Elevated LFTs    Family history of adverse reaction to anesthesia    multiple family members have had problems such as slow to wake / "flat lined" /  ventilator     Frequency of urination    Glaucoma, narrow-angle    History of skin cancer    Hypertension    Non-ST elevated myocardial infarction (non-STEMI) ([place]) Sept 2012   Mild elevation in troponin and CK MB but no evidence of coronary disease at time of cath   Obesity    Ovarian cancer ([place]) 11/22/2013   Disseminated ovarian cancer   Overactive bladder    PONV (postoperative nausea and vomiting)    Portal hypertension ([place])    Restless leg syndrome    Tingling    hands and feet    PAST SURGICAL HISTORY:   Past Surgical History:  Procedure Laterality Date   ABDOMINAL ULTRASOUND  Sept 2012   No gallbladder disease, + fatty liver   CARDIAC CATHETERIZATION  10/25/2010   EF 60%; Normal coronaries   COLONOSCOPY  10/18/2005   COSMETIC SURGERY  2003   DENTAL SURGERY     ESOPHAGEAL BANDING  02/10/2018   Procedure: ESOPHAGEAL BANDING;  Surgeon: Ronnette Juniper, MD;  Location: [facility];  Service: Gastroenterology;;   ESOPHAGEAL BANDING N/A 03/23/2018   Procedure: ESOPHAGEAL BANDING;  Surgeon: Ronnette Juniper, MD;  Location: [facility];  Service: Gastroenterology;  Laterality: N/A;   ESOPHAGOGASTRODUODENOSCOPY N/A 03/23/2018   Procedure: ESOPHAGOGASTRODUODENOSCOPY (EGD);  Surgeon: Ronnette Juniper, MD;  Location: [facility];  Service: Gastroenterology;  Laterality: N/A;   ESOPHAGOGASTRODUODENOSCOPY (EGD) WITH PROPOFOL N/A 02/10/2018   Procedure: ESOPHAGOGASTRODUODENOSCOPY (EGD) WITH PROPOFOL;  Surgeon: Ronnette Juniper, MD;  Location: [facility];  Service: Gastroenterology;  Laterality: N/A;   EYE SURGERY Bilateral 09/19/14 R eye, 08/2014 L eye   open angles in both eyes d/t narrow angle glaucoma   LAPAROTOMY N/A 03/29/2014   Procedure: EXPLORATORY LAPAROTOMY;   Surgeon: Everitt Amber, MD;  Location: [facility];  Service: Gynecology;  Laterality: N/A;   ORIF ANKLE FRACTURE Right 03/09/2018   Procedure: OPEN REDUCTION INTERNAL FIXATION (ORIF) RIGHT LATERAL MALLEOLUS;  Surgeon: Leandrew Koyanagi, MD;  Location: [facility];  Service: Orthopedics;  Laterality: Right;   PARTIAL HYSTERECTOMY  1988   SALPINGOOPHORECTOMY Bilateral 03/29/2014   Procedure: BILATERAL SALPINGO OOPHORECTOMY, OMENTECTOMY, DEBULKING;  Surgeon: Everitt Amber, MD;  Location: [facility];  Service: Gynecology;  Laterality: Bilateral;   TONSILLECTOMY  1971   TUBAL LIGATION     WH-MAMMOGRAPHY  11/24/2009    SOCIAL HISTORY:   Social History   Socioeconomic History   Marital status: Married    Spouse name: Not on file   Number of children: 1 D   Years of education: Not on file   Highest education level: Not on file  Occupational History   Occupation: Retired  Scientist, product/process development strain: Not on file   Food insecurity    Worry: Not on file    Inability: Not on Lexicographer needs    Medical: Not on file    Non-medical: Not on file  Tobacco Use   Smoking status: Former Smoker    Packs/day: 1.00    Years: 1.00    Pack years:  1.00    Types: Cigarettes    Quit date: 02/11/1982    Years since quitting: 36.4   Smokeless tobacco: Never Used  Substance and Sexual Activity   Alcohol use: No    Alcohol/week: 0.0 standard drinks   Drug use: No   Sexual activity: Not on file  Lifestyle   Physical activity    Days per week: Not on file    Minutes per session: Not on file   Stress: Not on file  Relationships   Social connections    Talks on phone: Not on file    Gets together: Not on file    Attends religious service: Not on file    Active member of club or organization: Not on file    Attends meetings of clubs or organizations: Not on file    Relationship status: Not on file   Intimate partner violence    Fear of current or ex partner: Not on file     Emotionally abused: Not on file    Physically abused: Not on file    Forced sexual activity: Not on file  Other Topics Concern   Not on file  Social History Narrative   Not on file    FAMILY HISTORY:   Family Status  Relation Name Status   Mother  Alive       arthritis, htn, DM   Father  Deceased at age 71       bladder cancer, stroke   Sister  Alive       breast cancer   Sister  Alive       htn   Sister  Alive       htn   Sister  Alive       htn   Mat Aunt  Alive   Cousin  Alive   Mat Uncle  Deceased at age infancy   MGM  Deceased at age 79       pneumonia   MGF  Deceased   PGM  Deceased   PGF  Deceased   Mat Aunt  Alive   Mat Aunt  Alive   Mat Aunt  Alive   Son  Deceased at age 64       died of brain anerysm, after a 34 ft fall   Brother  Paediatric nurse  (Not Specified)    ROS:  Review of Systems  HENT: Negative.   Eyes: Negative.   Respiratory: Negative.   Cardiovascular: Negative.   Gastrointestinal: Negative.   Genitourinary: Negative.   Skin: Negative.   Endo/Heme/Allergies: Negative.      PHYSICAL EXAMINATION:    VITALS:   Vitals:   07/30/18 0836  Pulse: 67  Temp: 98.3 F (36.8 C)  SpO2: 99%  Weight: 182 lb 6.4 oz (82.7 kg)  Height: 5' 1.5" (1.562 m)     Gen:  Appears stated age and in NAD. HEENT:  Normocephalic, atraumatic. The mucous membranes are moist. The superficial temporal arteries are without ropiness or tenderness. Cardiovascular: Regular rate and rhythm. Lungs: Clear to auscultation bilaterally. Neck: There are no carotid bruits noted bilaterally.  NEUROLOGICAL:  Orientation:  The patient is alert and oriented x 3.  Cranial nerves: There is good facial symmetry. The pupils are equal round and reactive to light bilaterally. Marland Kitchen Speech is fluent and clear. Soft palate rises symmetrically and there is no tongue deviation. Hearing is intact to conversational tone. Tone: Tone is good  throughout. Sensation: Sensation is intact to light touch and pinprick throughout (facial, trunk, extremities). Vibration is intact at the bilateral big toe. There is no extinction with double simultaneous stimulation. There is no sensory dermatomal level identified. Coordination:  The patient has no difficulty with RAM's or FNF bilaterally. Motor: Strength is at least antigravity x4. Gait and Station: The patient is able to ambulate without difficulty.     Lab Results  Component Value Date   WBC 4.5 07/09/2018   HGB 12.4 07/09/2018   HCT 38.5 07/09/2018   MCV 95.5 07/09/2018   PLT 79 (L) 07/09/2018   Lab Results  Component Value Date   TSH 1.119 10/13/2014   Lab Results  Component Value Date   IRON 75 07/09/2018   TIBC 334 07/09/2018   FERRITIN 79 07/09/2018     Chemistry      Component Value Date/Time   NA 141 07/09/2018 0810   NA 142 02/06/2017 0742   K 4.3 07/09/2018 0810   K 3.9 02/06/2017 0742   CL 108 07/09/2018 0810   CO2 25 07/09/2018 0810   CO2 22 02/06/2017 0742   BUN 22 07/09/2018 0810   BUN 13.5 02/06/2017 0742   CREATININE 0.92 07/09/2018 0810   CREATININE 0.8 02/06/2017 0742      Component Value Date/Time   CALCIUM 9.2 07/09/2018 0810   CALCIUM 8.5 02/06/2017 0742   ALKPHOS 132 (H) 07/09/2018 0810   ALKPHOS 130 02/06/2017 0742   AST 34 07/09/2018 0810   AST 47 (H) 02/06/2017 0742   ALT 38 07/09/2018 0810   ALT 54 02/06/2017 0742   BILITOT 0.8 07/09/2018 0810   BILITOT 0.65 02/06/2017 0742        IMPRESSION/PLAN  1. RLS, secondary to iron def anemia  -Serum ferritin was very good in May, 2020 as were her iron studies  -She will continue with the Neupro patch, 4 mg daily.  No site reactions.  -talked about her ice eating again.  She knows to stay away from that.  2.   Thrombocytopenia  -chronic due to liver cirrhosis from NASH.  Has had esophageal varicies as well.  3.  F/u 6 months.

## 2018-07-28 NOTE — Progress Notes (Signed)
Office Visit Note   Patient: Olivia Benson           Date of Birth: 01-11-1946           MRN: [id number] Visit Date: 07/28/2018              Requested by: Helane Rima, MD [address] [place],  [place] [id number] PCP: Helane Rima, MD   Assessment & Plan: Visit Diagnoses:  1. Displaced fracture of distal end of right fibula     Plan: This point Olivia Benson has done very well from her surgery.  She has demonstrated healing of her fracture.  From my standpoint she is released to full activity.  Follow-up as needed.  Follow-Up Instructions: Return if symptoms worsen or fail to improve.   Orders:  No orders of the defined types were placed in this encounter.  No orders of the defined types were placed in this encounter.     Procedures: No procedures performed   Clinical Data: No additional findings.   Subjective: Chief Complaint  Patient presents with  . Right Ankle - Pain    Olivia Benson is 35-monthstatus post ORIF right lateral malleolus fracture.  She comes in for her follow-up.  She is doing well and has no complaints.  She occasionally has some soreness.  She not having any problems with hardware irritation.  Denies any swelling.   Review of Systems   Objective: Vital Signs: There were no vitals taken for this visit.  Physical Exam  Ortho Exam Right ankle exam shows a fully healed surgical scar.  No swelling.  No irritation. Specialty Comments:  No specialty comments available.  Imaging: No results found.   PMFS History: Patient Active Problem List   Diagnosis Date Noted  . Chronic diastolic CHF (congestive heart failure) ([place] 03/09/2018  . Fall 03/08/2018  . Displaced fracture of distal end of right fibula 03/08/2018  . GIB (gastrointestinal bleeding) 02/09/2018  . Obesity due to excess calories 03/20/2017  . Cardiomyopathy ([facility] 01/24/2017  . Sinus congestion 01/24/2017  . Cardiomyopathy due to chemotherapy ([place] 12/03/2016  .  Goals of care, counseling/discussion 09/03/2016  . Thrombocytopenia ([facility] 04/15/2016  . Vasomotor instability 01/17/2016  . Osteopenia determined by x-ray 12/22/2015  . Encounter for antineoplastic chemotherapy 12/09/2015  . Elevated tumor markers 10/01/2015  . Obesity (BMI 35.0-39.9 without comorbidity) 05/31/2015  . Port catheter in place 03/29/2015  . Iron deficiency anemia 03/05/2015  . Low back pain 03/05/2015  . Neuropathic pain of foot 11/21/2014  . Diabetes mellitus without complication ([facility] 044/92/0100 . Portacath in place 05/10/2014  . Leukopenia due to antineoplastic chemotherapy ([facility] 05/10/2014  . Splenomegaly 05/10/2014  . Primary peritoneal carcinomatosis ([facility] 05/08/2014  . Genetic testing 03/16/2014  . Neutropenic fever ([place] 01/27/2014  . Pancytopenia, acquired ([facility] 01/27/2014  . Fever and neutropenia ([facility] 01/27/2014  . Asthma 01/27/2014  . Malignant ascites 01/25/2014  . Chemotherapy induced neutropenia ([place] 01/25/2014  . Chemotherapy induced thrombocytopenia 01/25/2014  . Restless legs syndrome 01/25/2014  . Liver cirrhosis secondary to NASH ([facility] 01/02/2014  . Disseminated ovarian cancer ([facility] [place] 12/03/2013  . 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 Ascites 11/15/2013  . Elevated cancer antigen 125 (CA-125) 11/15/2013  . Cough variant asthma vs UACS 09/02/2013  . Essential hypertension 09/02/2013  . Dyspnea 10/22/2012  . Fatty liver disease, nonalcoholic 071/21/9758 . S/P cardiac cath 11/06/2010   Past Medical History:  Diagnosis Date  . Asthma   .  Cirrhosis ([place])   . Complication of anesthesia    slow to wake up / n & v  . Diabetes mellitus   . Elevated LFTs   . Family history of adverse reaction to anesthesia    multiple family members have had problems such as slow to wake / "flat lined" /  ventilator    . Frequency of urination   . Glaucoma, narrow-angle   . History of skin cancer   . Hypertension    . Non-ST elevated myocardial infarction (non-STEMI) [facility]) Sept 2012   Mild elevation in troponin and CK MB but no evidence of coronary disease at time of cath  . Obesity   . Ovarian cancer ([place]) 11/22/2013   Disseminated ovarian cancer  . Overactive bladder   . PONV (postoperative nausea and vomiting)   . Portal hypertension ([place])   . Restless leg syndrome   . Tingling    hands and feet    Family History  Problem Relation Age of Onset  . Asthma Mother   . Diabetes Mother   . Hypertension Mother   . Allergies Mother   . Rheum arthritis Mother   . Bladder Cancer Father 68       former heavy smoker  . Hypertension Father   . Stroke Father   . Breast cancer Sister 46  . Diabetes Sister        x 3  . Allergies Sister   . Asthma Sister        x 2   . Breast cancer Maternal Aunt 85  . Breast cancer Cousin 65       (maternal) bilateral cancer  . Aneurysm Son 59  . Diabetes Brother   . Allergies Brother   . Asthma Brother     Past Surgical History:  Procedure Laterality Date  . ABDOMINAL ULTRASOUND  Sept 2012   No gallbladder disease, + fatty liver  . CARDIAC CATHETERIZATION  10/25/2010   EF 60%; Normal coronaries  . COLONOSCOPY  10/18/2005  . COSMETIC SURGERY  2003  . DENTAL SURGERY    . ESOPHAGEAL BANDING  02/10/2018   Procedure: ESOPHAGEAL BANDING;  Surgeon: Ronnette Juniper, MD;  Location[facility];  Service: Gastroenterology;;  . ESOPHAGEAL BANDING N/A 03/23/2018   Procedure: ESOPHAGEAL BANDING;  Surgeon: Ronnette Juniper, MD;  Location: [facility];  Service: Gastroenterology;  Laterality: N/A;  . ESOPHAGOGASTRODUODENOSCOPY N/A 03/23/2018   Procedure: ESOPHAGOGASTRODUODENOSCOPY (EGD);  Surgeon: Ronnette Juniper, MD;  Location: [facility];  Service: Gastroenterology;  Laterality: N/A;  . ESOPHAGOGASTRODUODENOSCOPY (EGD) WITH PROPOFOL N/A 02/10/2018   Procedure: ESOPHAGOGASTRODUODENOSCOPY (EGD) WITH PROPOFOL;  Surgeon: Ronnette Juniper, MD;  Location: [facility];  Service:  Gastroenterology;  Laterality: N/A;  . EYE SURGERY Bilateral 09/19/14 R eye, 08/2014 L eye   open angles in both eyes d/t narrow angle glaucoma  . LAPAROTOMY N/A 03/29/2014   Procedure: EXPLORATORY LAPAROTOMY;  Surgeon: Everitt Amber, MD;  Location: [facility];  Service: Gynecology;  Laterality: N/A;  . ORIF ANKLE FRACTURE Right 03/09/2018   Procedure: OPEN REDUCTION INTERNAL FIXATION (ORIF) RIGHT LATERAL MALLEOLUS;  Surgeon: Leandrew Koyanagi, MD;  Location: [facility];  Service: Orthopedics;  Laterality: Right;  . PARTIAL HYSTERECTOMY  1988  . SALPINGOOPHORECTOMY Bilateral 03/29/2014   Procedure: BILATERAL SALPINGO OOPHORECTOMY, OMENTECTOMY, DEBULKING;  Surgeon: Everitt Amber, MD;  Location: [facility];  Service: Gynecology;  Laterality: Bilateral;  . TONSILLECTOMY  1971  . TUBAL LIGATION    . [facility]  11/24/2009   Social History   Occupational  History  . Occupation: Retired  Tobacco Use  . Smoking status: Former Smoker    Packs/day: 1.00    Years: 1.00    Pack years: 1.00    Types: Cigarettes    Quit date: 02/11/1982    Years since quitting: 36.4  . Smokeless tobacco: Never Used  Substance and Sexual Activity  . Alcohol use: No    Alcohol/week: 0.0 standard drinks  . Drug use: No  . Sexual activity: Not on file

## 2018-07-30 ENCOUNTER — Ambulatory Visit (INDEPENDENT_AMBULATORY_CARE_PROVIDER_SITE_OTHER): Payer: Medicare Other | Admitting: Neurology

## 2018-07-30 ENCOUNTER — Encounter: Payer: Self-pay | Admitting: Neurology

## 2018-07-30 ENCOUNTER — Other Ambulatory Visit: Payer: Self-pay

## 2018-07-30 VITALS — BP 136/60 | HR 67 | Temp 98.3°F | Ht 61.5 in | Wt 182.4 lb

## 2018-07-30 DIAGNOSIS — G2581 Restless legs syndrome: Secondary | ICD-10-CM

## 2018-07-30 DIAGNOSIS — D509 Iron deficiency anemia, unspecified: Secondary | ICD-10-CM

## 2018-07-30 NOTE — Patient Instructions (Signed)
Follow up in 6 months.  It was good to see you today!  The physicians and staff at [facility] are committed to providing excellent care. You may receive a survey requesting feedback about your experience at our office. We strive to receive "very good" responses to the survey questions. If you feel that your experience would prevent you from giving the office a "very good " response, please contact our office to try to remedy the situation. We may be reached at [phone]. Thank you for taking the time out of your busy day to complete the survey.

## 2018-08-13 ENCOUNTER — Other Ambulatory Visit: Payer: Self-pay | Admitting: Gastroenterology

## 2018-08-25 ENCOUNTER — Other Ambulatory Visit: Payer: Self-pay | Admitting: Gastroenterology

## 2018-09-07 ENCOUNTER — Inpatient Hospital Stay: Payer: Medicare Other

## 2018-09-07 ENCOUNTER — Inpatient Hospital Stay: Payer: Medicare Other | Attending: Hematology and Oncology | Admitting: Hematology and Oncology

## 2018-09-07 ENCOUNTER — Other Ambulatory Visit: Payer: Self-pay

## 2018-09-07 DIAGNOSIS — C482 Malignant neoplasm of peritoneum, unspecified: Secondary | ICD-10-CM

## 2018-09-07 DIAGNOSIS — R970 Elevated carcinoembryonic antigen [CEA]: Secondary | ICD-10-CM

## 2018-09-07 DIAGNOSIS — Z9221 Personal history of antineoplastic chemotherapy: Secondary | ICD-10-CM

## 2018-09-07 DIAGNOSIS — D696 Thrombocytopenia, unspecified: Secondary | ICD-10-CM | POA: Diagnosis not present

## 2018-09-07 DIAGNOSIS — Z79810 Long term (current) use of selective estrogen receptor modulators (SERMs): Secondary | ICD-10-CM

## 2018-09-07 DIAGNOSIS — R161 Splenomegaly, not elsewhere classified: Secondary | ICD-10-CM

## 2018-09-07 DIAGNOSIS — I10 Essential (primary) hypertension: Secondary | ICD-10-CM | POA: Diagnosis not present

## 2018-09-07 DIAGNOSIS — K7581 Nonalcoholic steatohepatitis (NASH): Secondary | ICD-10-CM | POA: Diagnosis not present

## 2018-09-07 DIAGNOSIS — D509 Iron deficiency anemia, unspecified: Secondary | ICD-10-CM

## 2018-09-07 DIAGNOSIS — D72819 Decreased white blood cell count, unspecified: Secondary | ICD-10-CM | POA: Diagnosis not present

## 2018-09-07 DIAGNOSIS — K7469 Other cirrhosis of liver: Secondary | ICD-10-CM

## 2018-09-07 DIAGNOSIS — C569 Malignant neoplasm of unspecified ovary: Secondary | ICD-10-CM

## 2018-09-07 DIAGNOSIS — K7460 Unspecified cirrhosis of liver: Secondary | ICD-10-CM

## 2018-09-07 LAB — CBC WITH DIFFERENTIAL/PLATELET
Abs Immature Granulocytes: 0.01 10*3/uL (ref 0.00–0.07)
Basophils Absolute: 0 10*3/uL (ref 0.0–0.1)
Basophils Relative: 0 %
Eosinophils Absolute: 0.1 10*3/uL (ref 0.0–0.5)
Eosinophils Relative: 2 %
HCT: 39.3 % (ref 36.0–46.0)
Hemoglobin: 12.8 g/dL (ref 12.0–15.0)
Immature Granulocytes: 0 %
Lymphocytes Relative: 32 %
Lymphs Abs: 1.4 10*3/uL (ref 0.7–4.0)
MCH: 30.3 pg (ref 26.0–34.0)
MCHC: 32.6 g/dL (ref 30.0–36.0)
MCV: 93.1 fL (ref 80.0–100.0)
Monocytes Absolute: 0.2 10*3/uL (ref 0.1–1.0)
Monocytes Relative: 5 %
Neutro Abs: 2.7 10*3/uL (ref 1.7–7.7)
Neutrophils Relative %: 61 %
Platelets: 91 10*3/uL — ABNORMAL LOW (ref 150–400)
RBC: 4.22 MIL/uL (ref 3.87–5.11)
RDW: 13.9 % (ref 11.5–15.5)
WBC: 4.5 10*3/uL (ref 4.0–10.5)
nRBC: 0 % (ref 0.0–0.2)

## 2018-09-07 LAB — COMPREHENSIVE METABOLIC PANEL
ALT: 33 U/L (ref 0–44)
AST: 32 U/L (ref 15–41)
Albumin: 4 g/dL (ref 3.5–5.0)
Alkaline Phosphatase: 120 U/L (ref 38–126)
Anion gap: 9 (ref 5–15)
BUN: 16 mg/dL (ref 8–23)
CO2: 25 mmol/L (ref 22–32)
Calcium: 9.4 mg/dL (ref 8.9–10.3)
Chloride: 107 mmol/L (ref 98–111)
Creatinine, Ser: 0.93 mg/dL (ref 0.44–1.00)
GFR calc Af Amer: >60 mL/min (ref >60)
GFR calc non Af Amer: >60 mL/min (ref >60)
Glucose, Bld: 105 mg/dL — ABNORMAL HIGH (ref 70–99)
Potassium: 4.2 mmol/L (ref 3.5–5.1)
Sodium: 141 mmol/L (ref 135–145)
Total Bilirubin: 1 mg/dL (ref 0.3–1.2)
Total Protein: 8 g/dL (ref 6.5–8.1)

## 2018-09-07 LAB — FERRITIN: Ferritin: 63 ng/mL (ref 11–307)

## 2018-09-07 LAB — IRON AND TIBC
Iron: 99 ug/dL (ref 41–142)
Saturation Ratios: 26 % (ref 21–57)
TIBC: 383 ug/dL (ref 236–444)
UIBC: 283 ug/dL (ref 120–384)

## 2018-09-07 MED ORDER — HEPARIN SOD (PORK) LOCK FLUSH 100 UNIT/ML IV SOLN
250.0000 [IU] | Freq: Once | INTRAVENOUS | Status: AC
  Administered 2018-09-07: 250 [IU]
  Filled 2018-09-07: qty 5

## 2018-09-07 MED ORDER — SODIUM CHLORIDE 0.9% FLUSH
10.0000 mL | Freq: Once | INTRAVENOUS | Status: AC
  Administered 2018-09-07: 10 mL
  Filled 2018-09-07: qty 10

## 2018-09-08 ENCOUNTER — Encounter: Payer: Self-pay | Admitting: Hematology and Oncology

## 2018-09-08 ENCOUNTER — Telehealth: Payer: Self-pay | Admitting: *Deleted

## 2018-09-08 ENCOUNTER — Other Ambulatory Visit: Payer: Self-pay | Admitting: Hematology and Oncology

## 2018-09-08 LAB — CA 125: Cancer Antigen (CA) 125: 111 U/mL — ABNORMAL HIGH (ref 0.0–38.1)

## 2018-09-08 MED ORDER — HYDROCODONE-ACETAMINOPHEN 5-325 MG PO TABS: 1.0000 | ORAL_TABLET | Freq: Four times a day (QID) | ORAL | 0 refills | Status: DC | PRN

## 2018-09-08 NOTE — Assessment & Plan Note (Signed)
Her blood pressure is intermittently elevated She is not symptomatic She will continue close follow-up and medical management through her primary care doctor

## 2018-09-08 NOTE — Assessment & Plan Note (Signed)
CT imaging show evidence of liver cirrhosis and splenomegaly Her liver enzymes are within normal limits Observe only for now She had appointment to follow with GI service to reevaluate esophageal varices

## 2018-09-08 NOTE — Progress Notes (Signed)
[place] OFFICE PROGRESS NOTE  Patient Care Team: Helane Rima, MD as PCP - General (Family Medicine)  ASSESSMENT & PLAN:  Primary peritoneal carcinomatosis ([place]) Clinically, she is not symptomatic However, at the time of dictation, her tumor marker is noted to be grossly elevated In the past, her baseline Ca1 25, in the absence of residuals disease seen on imaging study, typically whole were around 50-80 With her tumor marker rising to above 100, I recommend CT imaging for evaluation and she agreed We will get that scheduled and I will see her back after test results are available to plan the next step  Essential hypertension Her blood pressure is intermittently elevated She is not symptomatic She will continue close follow-up and medical management through her primary care doctor  Thrombocytopenia ([place]) The intermittent leukopenia 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 She is not symptomatic.  Recommend close observation only  Liver cirrhosis secondary to NASH [facility]) CT imaging show evidence of liver cirrhosis and splenomegaly Her liver enzymes are within normal limits Observe only for now She had appointment to follow with GI service to reevaluate esophageal varices   Orders Placed This Encounter  Procedures  . CT ABDOMEN PELVIS W CONTRAST    Standing Status:   Future    Standing Expiration Date:   09/08/2019    Order Specific Question:   If indicated for the ordered procedure, I authorize the administration of contrast media per Radiology protocol    Answer:   Yes    Order Specific Question:   Preferred imaging location?    Answer:   [facility]    Order Specific Question:   Radiology Contrast Protocol - do NOT remove file path    Answer:   \\[email or web address]  . CT CHEST W CONTRAST    Standing Status:   Future    Standing Expiration Date:   09/08/2019    Order Specific Question:    If indicated for the ordered procedure, I authorize the administration of contrast media per Radiology protocol    Answer:   Yes    Order Specific Question:   Preferred imaging location?    Answer:   [facility]    Order Specific Question:   Radiology Contrast Protocol - do NOT remove file path    Answer:   \\[email or web address]    INTERVAL HISTORY: Please see below for problem oriented charting. 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  No worrisome liver mass.  There are associated portal venous collaterals and splenomegaly consistent with portal venous hypertension. 2.  No other significant upper abdominal findings   11/08/2013 Imaging   US showed ascites   11/08/2013 Initial Diagnosis   Patient had ~ 2 months of some urinary incontinence, saw Dr Ronita Hipps in 10-2013, with pelvic mass on exam    11/12/2013 Imaging   There are findings of progressive hepatic cirrhosis with a large amount of ascites and mild splenomegaly. There are venous collaterals present. 2. There is abnormal thickening of the peritoneal surface along the left mid and lower abdominal wall. There is abnormal soft tissue density in  the pelvis as well which could reflect the clinically suspected ovarian malignancy but the findings are nondiagnostic. 3. There is a new small left pleural effusion. 4. There is no acute bowel abnormality.   11/18/2013 Imaging   Successful ultrasound guided diagnostic and therapeutic paracentesis yielding 4.1 liters of ascites.    11/18/2013 Pathology Results    PERITONEAL/ASCITIC FLUID (SPECIMEN 1 OF 1 COLLECTED 11/18/13): SEROUS CARCINOMA, PLEASE SEE COMMENT.   12/01/2013 Tumor Marker   Patient's tumor was tested for the following markers: CA125 Results of the tumor marker test revealed 1938   12/03/2013 Imaging   Successful ultrasound-guided therapeutic paracentesis yielding 3.6 liters of peritoneal fluid.   12/07/2013 - 05/30/2014 Chemotherapy   She received 3 cycles of carboplatin and Taxol, interrupted cycle 4 for surgery.  She subsequently completed 3 more cycles of chemotherapy after surgery   12/20/2013 Imaging   Successful ultrasound-guided diagnostic and therapeutic paracentesis yielding 2.2 liters of peritoneal fluid   12/20/2013 Pathology Results   PERITONEAL/ASCITIC FLUID(SPECIMEN 1 OF 1 COLLECTED 12/20/13): MALIGNANT CELLS CONSISTENT WITH SEROUS CARCINOMA   01/13/2014 Tumor Marker   Patient's tumor was tested for the following markers: CA125 Results of the tumor marker test revealed 227   02/07/2014 Tumor Marker   Patient's tumor was tested for the following markers: CA125 Results of the tumor marker test revealed 130   02/15/2014 Genetic Testing   Genetics testing from 02-2014 normal ([employer] panel)   02/23/2014 Imaging   Interval decrease in ascites. 2. Morphologic changes in the liver consistent with cirrhosis. 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 Portal vein remains patent at this time. 3. Persistent but decreased abnormal soft tissue attenuation tracking in the omental fat. This may be secondary to interval improvement in metastatic disease. 4. Peritoneal thickening seen in the left abdomen and pelvis on the previous study has decreased and nearly resolved in the interval. This also suggests interval improvement in metastatic disease.    03/07/2014 Procedure   Ultrasound and fluoroscopically guided right internal jugular single lumen power port catheter insertion. Tip in the SVC/RA  junction. Catheter ready for use.   03/17/2014 Tumor Marker   Patient's tumor was tested for the following markers: CA125 Results of the tumor marker test revealed 45   03/29/2014 Pathology Results   1. Omentum, resection for tumor - HIGH GRADE SEROUS CARCINOMA, SEE COMMENT. 2. Ovary and fallopian tube, right - HIGH GRADE SEROUS CARCINOMA, SEE COMMENT. 3. Ovary and fallopian tube, left - HIGH GRADE SEROUS CARCINOMA, SEE COMMENT. Diagnosis Note 1. Nests and clusters of malignant cells are invading the omental tissue (part #1) with associated fibrosis. The cells are pleomorphic with prominent nucleoli. There are scattered psammoma bodies. The ovaries are atrophic and exhibit multiple foci of surface based invasive carcinoma and associated fibrosis. The fallopian tubes have a few foci of carcinoma, also superficially located. There are no precursor lesions noted in the ovaries or fallopian tubes (the entire tubes and ovaries were submitted for evaluation). While there is some retraction artifact, there are several foci suspicious for lymphovascular invasion. 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 Given the fibrosis, there does appear to be a small amount of treatment effect, however, there is abundant residual tumor.    03/29/2014 Surgery   Procedure(s) Performed: 1. Exploratory laparotomy with bilateral salpingo-oophorectomy, omentectomy radical tumor debulking for ovarian cancer .  Surgeon: Terrence Dupont  Pamella Pert, MD.  Assistant Surgeon: Lahoma Crocker, M.D. Assistant: (an MD assistant was necessary for tissue manipulation, retraction and positioning due to the complexity of the case and hospital policies).  Operative Findings: 10cm omental cake from hepatic to splenic flexure, densely adherent to transverse colon. Milliary studding of tumor implants (<42m, too  numerous in number to count) adherent to the mesentery of the small bowel and small bowel wall. Small volume (100cc) ascites. Small ovaries bilaterally, densely adherent to the pelvic cul de sac. Left ovary and tube densely adherent to sigmoid colon. Cirrhotic liver with hepatomegaly and splenomegaly.     This represented an optimal cytoreduction (R1) with visible disease residual on the bowel wall and mesentery (millial, <316mimplants).     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 2. 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    08/18/2014 Tumor Marker   Patient's tumor was tested for the following markers: CA125 Results of the tumor marker test revealed 16   10/13/2014 Tumor Marker   Patient's tumor was tested for the following markers: CA125 Results of the tumor marker test revealed 14   11/21/2014 Tumor Marker   Patient's tumor was tested for the following markers: CA125 Results of the tumor marker test revealed 16   12/28/2014 Tumor Marker   Patient's tumor was tested for the following markers: CA125 Results of the tumor marker test revealed 762   02/27/2015 Tumor Marker   Patient's tumor was tested for the following markers: CA125 Results of the tumor marker test revealed 20.2   03/22/2015 Imaging   Filler appearance to the prior exam, with very subtle fluid tracking along portions of the liver, and very minimal nodularity along the right paracolic gutter representing residua from the prior  peritoneal cancer. The current abnormalities could simply be post therapy findings rather than necessarily representing residual malignancy. No new or enlarging lesions are identified. 2. Hepatic cirrhosis and splenomegaly. There is some gastric varices suggesting portal venous hypertension. 3. Left foraminal stenosis at L4-5 due to spurring. There is likely also some central narrowing of the thecal sac at this level. 4. Bibasilar scarring. 5. Chronic mild left mid kidney scarring   03/27/2015 Tumor Marker   Patient's tumor was tested for the following markers: CA125 Results of the tumor marker test revealed 25.3   04/24/2015 Imaging   Disc bulge L2-3 with mild spinal stenosis and right foraminal encroachment. 2. Disc bulge L3-4 with mild spinal stenosis. 3. Multifactorial spinal, left lateral recess and foraminal stenosis L4-5. There is grade 1 anterolisthesis without evident dynamic instability.   05/29/2015 Tumor Marker   Patient's tumor was tested for the following markers: CA125 Results of the tumor marker test revealed 29.9   08/21/2015 Tumor Marker   Patient's tumor was tested for the following markers: CA125 Results of the tumor marker test revealed 45   09/18/2015 Tumor Marker   Patient's tumor was tested for the following markers: CA125 Results of the tumor marker test revealed 47   09/27/2015 Imaging  New small bowel mesenteric nodule and enlarged left external iliac lymph node, highly worrisome for metastatic disease. 2. Cirrhosis and splenomegaly   10/10/2015 Tumor Marker   Patient's tumor was tested for the following markers: CA125 Results of the tumor marker test revealed 53.4   10/10/2015 - 12/11/2015 Chemotherapy   She received 4 cycles of carboplatin only   11/20/2015 Tumor Marker   Patient's tumor was tested for the following markers: CA125 Results of the tumor marker test revealed 41.3   12/20/2015 Imaging   CT: Mild left external iliac lymphadenopathy is slightly  decreased. Mildly enlarged lower mesenteric nodule is slightly decreased. 2. No new or progressive metastatic disease in the abdomen or pelvis. 3. Cirrhosis. Stable subcentimeter hypodense left liver lobe lesion. No new liver lesions. 4. Stable mild splenomegaly.  No ascites. 5. Mild sigmoid diverticulosis.   01/01/2016 -  Anti-estrogen oral therapy   She was placed on tamoxifen. Had letrozole initially but switched to tamoxifen due to poor tolerance   01/15/2016 Tumor Marker   Patient's tumor was tested for the following markers: CA125 Results of the tumor marker test revealed 31.4   02/19/2016 Tumor Marker   Patient's tumor was tested for the following markers: CA125 Results of the tumor marker test revealed 36.5   05/13/2016 Imaging   No evidence of disease progression within the abdomen or pelvis. Previously noted small central mesenteric nodule and left external iliac node appear slightly smaller. 2. Stable changes of hepatic cirrhosis and portal hypertension with associated splenomegaly. No new or enlarging hepatic lesions are identified. 3. No acute findings.   05/13/2016 Tumor Marker   Patient's tumor was tested for the following markers: CA125 Results of the tumor marker test revealed 41.2   06/24/2016 Tumor Marker   Patient's tumor was tested for the following markers: CA125 Results of the tumor marker test revealed 47.3   08/20/2016 Imaging   1. The left external iliac lymph node is slightly increased in size compared to the prior exam, currently 1.1 cm and previously 0.9 cm in diameter. 2. Stable central mesenteric lymph node at 0.9 cm in diameter. 3. Stable trace free pelvic fluid and stable slight thickening along the right paracolic gutter, without well-defined peritoneal nodularity. 4. Other imaging findings of potential clinical significance: Subsegmental atelectasis or scarring in the lung bases. Hepatic cirrhosis. Left renal scarring. Mild splenomegaly. Sigmoid colon  diverticulosis. Impingement at L4-5 due to spondylosis and degenerative disc disease broad Schmorl' s nodes at L3-L4. Pelvic floor laxity   08/23/2016 Tumor Marker   Patient's tumor was tested for the following markers: CA125 Results of the tumor marker test revealed 80.2   09/04/2016 Imaging   LV EF: 60% -   65%   09/12/2016 Tumor Marker   Patient's tumor was tested for the following markers: CA125 Results of the tumor marker test revealed 98.5   09/12/2016 - 01/29/2018 Chemotherapy   The patient received Doxil and Avastin. Avastin was discontinued in Dec 2019 due to GI hemorrhage   09/26/2016 Tumor Marker   Patient's tumor was tested for the following markers: CA125 Results of the tumor marker test revealed 68.9   10/10/2016 Tumor Marker   Patient's tumor was tested for the following markers: CA125 Results of the tumor marker test revealed 65.6   11/07/2016 Tumor Marker   Patient's tumor was tested for the following markers: CA125 Results of the tumor marker test revealed 46.4   11/29/2016 Imaging   Stable mild peritoneal thickening, suspicious for peritoneal carcinomatosis. No  new or progressive disease identified. No evidence of ascites.  No significant change in 10 mm left external iliac and 8 mm mesenteric lymph nodes.  Stable hepatic cirrhosis, and splenomegaly consistent with portal venous hypertension.  Colonic diverticulosis, without radiographic evidence of diverticulitis.   12/02/2016 Imaging   Normal LV size with EF 55%. Basal inferior and basal inferoseptal hypokinesis. Normal RV size and systolic function. No significant valvular abnormalities.   12/05/2016 Tumor Marker   Patient's tumor was tested for the following markers: CA125 Results of the tumor marker test revealed 49.1   01/09/2017 Tumor Marker   Patient's tumor was tested for the following markers: CA125 Results of the tumor marker test revealed 47.2   02/06/2017 Tumor Marker   Patient's tumor was  tested for the following markers: CA125 Results of the tumor marker test revealed 44.1   02/18/2017 Imaging   Stable mild peritoneal thickening. No new or progressive disease identified within the abdomen or pelvis.  Stable tiny sub-cm left external iliac and mesenteric lymph nodes.  Stable hepatic cirrhosis, and splenomegaly consistent with portal venous hypertension. No evidence of hepatic neoplasm.  Colonic diverticulosis, without radiographic evidence of diverticulitis.   03/06/2017 Tumor Marker   Patient's tumor was tested for the following markers: CA125 Results of the tumor marker test revealed 50.4   03/17/2017 Imaging   - Left ventricle: The cavity size was normal. Wall thickness was normal. Systolic function was normal. The estimated ejection fraction was in the range of 55% to 60%. Wall motion was normal; there were no regional wall motion abnormalities. Features are consistent with a pseudonormal left ventricular filling pattern, with concomitant abnormal relaxation and increased filling pressure (grade 2 diastolic dysfunction). - Mitral valve: There was mild regurgitation. - Left atrium: The atrium was mildly dilated   04/03/2017 Tumor Marker   Patient's tumor was tested for the following markers: CA125 Results of the tumor marker test revealed 47.2   05/14/2017 Imaging   CT scan of abdomen and pelvis Stable mild peritoneal thickening. No new or progressive disease within the abdomen or pelvis.  Stable hepatic cirrhosis. Stable splenomegaly, consistent with portal venous hypertension. No evidence of hepatic neoplasm.   05/15/2017 Tumor Marker   Patient's tumor was tested for the following markers: CA125 Results of the tumor marker test revealed 56.1   06/12/2017 Tumor Marker   Patient's tumor was tested for the following markers: CA125 Results of the tumor marker test revealed 49.2   07/10/2017 Tumor Marker   Patient's tumor was tested for the following markers: CA125 Results  of the tumor marker test revealed 46.3   08/07/2017 Tumor Marker   Patient's tumor was tested for the following markers: CA125 Results of the tumor marker test revealed 52.9   08/20/2017 Imaging   1. Mild omental/peritoneal haziness, unchanged. No evidence of new metastatic disease. 2. Cirrhosis with splenomegaly.   09/04/2017 Tumor Marker   Patient's tumor was tested for the following markers: CA125 Results of the tumor marker test revealed 48.5   11/13/2017 Tumor Marker   Patient's tumor was tested for the following markers: CA125 Results of the tumor marker test revealed 55.8   12/17/2017 Imaging   1. No findings to suggest metastatic disease in the abdomen or pelvis. 2. Severe hepatic cirrhosis with evidence of portal hypertension, as demonstrated by dilated portal vein, splenomegaly and portosystemic collateral pathways, including gastric and esophageal varices.  3. Colonic diverticulosis without evidence of acute diverticulitis at this time. 4. Additional incidental findings, as above.  01/16/2018 Tumor Marker   Patient's tumor was tested for the following markers: CA125 Results of the tumor marker test revealed 63.7   02/10/2018 Procedure   EGD - Recently bleeding grade III and large (> 5 mm) esophageal varices. Completely eradicated. Banded. - Portal hypertensive gastropathy. - Normal examined duodenum. - No specimens collected   02/13/2018 Tumor Marker   Patient's tumor was tested for the following markers: CA125 Results of the tumor marker test revealed 82.7   02/25/2018 Imaging   Bone density showed mild osteopenia   04/08/2018 Tumor Marker   Patient's tumor was tested for the following markers: CA125 Results of the tumor marker test revealed 82.7   04/08/2018 Imaging   1. No definite omental or peritoneal surface lesions. However, there are 2 slowly enlarging lymph nodes noted. One is in the small bowel mesentery and the other is in the left deep pelvis. Could not  exclude recurrent disease. 2. Stable advanced cirrhotic changes involving the liver with portal venous hypertension, portal venous collaterals, esophageal varices and splenomegaly. No worrisome hepatic lesions. 3. Diffuse wall thickening of the colon could suggest diffuse colitis or could be due to low albumin. Recommend correlation with any symptoms such as diarrhea.   05/21/2018 Tumor Marker   Patient's tumor was tested for the following markers: CA125 Results of the tumor marker test revealed 86   09/07/2018 Tumor Marker   Patient's tumor was tested for the following markers:CA-125 Results of the tumor marker test revealed 111     REVIEW OF SYSTEMS:   Constitutional: Denies fevers, chills or abnormal weight loss Eyes: Denies blurriness of vision Ears, nose, mouth, throat, and face: Denies mucositis or sore throat Respiratory: Denies cough, dyspnea or wheezes Cardiovascular: Denies palpitation, chest discomfort or lower extremity swelling Gastrointestinal:  Denies nausea, heartburn or change in bowel habits Skin: Denies abnormal skin rashes Lymphatics: Denies new lymphadenopathy or easy bruising Neurological:Denies numbness, tingling or new weaknesses Behavioral/Psych: Mood is stable, no new changes  All other systems were reviewed with the patient and are negative.  I have reviewed the past medical history, past surgical history, social history and family history with the patient and they are unchanged from previous note.  ALLERGIES:  is allergic to shellfish allergy; benadryl [diphenhydramine hcl]; penicillins; and tape.  MEDICATIONS:  Current Outpatient Medications  Medication Sig Dispense Refill  . cholecalciferol (VITAMIN D) 1000 units tablet Take 1,000 Units by mouth at bedtime.     . gabapentin (NEURONTIN) 300 MG capsule TAKE 2 CAPSULES(600 MG) BY MOUTH TWICE DAILY 120 capsule 11  . hydrochlorothiazide (MICROZIDE) 12.5 MG capsule Take 12.5 mg by mouth daily after breakfast.    1  . HYDROcodone-acetaminophen (NORCO/VICODIN) 5-325 MG tablet Take 1 tablet by mouth every 6 (six) hours as needed for moderate pain. 60 tablet 0  . insulin degludec (TRESIBA FLEXTOUCH) 100 UNIT/ML SOPN FlexTouch Pen Inject 18 Units into the skin daily after breakfast.     . lidocaine-prilocaine (EMLA) cream Apply to Porta-cath  1-2 hours prior to access as directed. (Patient taking differently: Apply 1 application topically daily as needed (port access). ) 30 g 9  . nadolol (CORGARD) 20 MG tablet Take 1 tablet (20 mg total) by mouth daily. (Patient taking differently: Take 20 mg by mouth at bedtime. ) 90 tablet 3  . pantoprazole (PROTONIX) 40 MG tablet Take 1 tablet (40 mg total) by mouth daily.    . rotigotine (NEUPRO) 4 MG/24HR APPLY 1 PATCH EXTERNALLY TO THE SKIN DAILY 30 patch  5   No current facility-administered medications for this visit.     PHYSICAL EXAMINATION: ECOG PERFORMANCE STATUS: 1 - Symptomatic but completely ambulatory  Vitals:   09/07/18 1223  BP: (!) 162/75  Pulse: 62  Resp: 20  Temp: 98.5 F (36.9 C)  SpO2: 100%   Filed Weights   09/07/18 1223  Weight: 184 lb 6.4 oz (83.6 kg)    GENERAL:alert, no distress and comfortable SKIN: skin color, texture, turgor are normal, no rashes or significant lesions EYES: normal, Conjunctiva are pink and non-injected, sclera clear OROPHARYNX:no exudate, no erythema and lips, buccal mucosa, and tongue normal  NECK: supple, thyroid normal size, non-tender, without nodularity LYMPH:  no palpable lymphadenopathy in the cervical, axillary or inguinal LUNGS: clear to auscultation and percussion with normal breathing effort HEART: regular rate & rhythm and no murmurs and no lower extremity edema ABDOMEN:abdomen soft, non-tender and normal bowel sounds Musculoskeletal:no cyanosis of digits and no clubbing  NEURO: alert & oriented x 3 with fluent speech, no focal motor/sensory deficits  LABORATORY DATA:  I have reviewed the data  as listed    Component Value Date/Time   NA 141 09/07/2018 1212   NA 142 02/06/2017 0742   K 4.2 09/07/2018 1212   K 3.9 02/06/2017 0742   CL 107 09/07/2018 1212   CO2 25 09/07/2018 1212   CO2 22 02/06/2017 0742   GLUCOSE 105 (H) 09/07/2018 1212   GLUCOSE 156 (H) 02/06/2017 0742   BUN 16 09/07/2018 1212   BUN 13.5 02/06/2017 0742   CREATININE 0.93 09/07/2018 1212   CREATININE 0.8 02/06/2017 0742   CALCIUM 9.4 09/07/2018 1212   CALCIUM 8.5 02/06/2017 0742   PROT 8.0 09/07/2018 1212   PROT 6.9 02/06/2017 0742   ALBUMIN 4.0 09/07/2018 1212   ALBUMIN 3.7 02/06/2017 0742   AST 32 09/07/2018 1212   AST 47 (H) 02/06/2017 0742   ALT 33 09/07/2018 1212   ALT 54 02/06/2017 0742   ALKPHOS 120 09/07/2018 1212   ALKPHOS 130 02/06/2017 0742   BILITOT 1.0 09/07/2018 1212   BILITOT 0.65 02/06/2017 0742   GFRNONAA >60 09/07/2018 1212   GFRAA >60 09/07/2018 1212    No results found for: SPEP, [facility]  Lab Results  Component Value Date   WBC 4.5 09/07/2018   NEUTROABS 2.7 09/07/2018   HGB 12.8 09/07/2018   HCT 39.3 09/07/2018   MCV 93.1 09/07/2018   PLT 91 (L) 09/07/2018      Chemistry      Component Value Date/Time   NA 141 09/07/2018 1212   NA 142 02/06/2017 0742   K 4.2 09/07/2018 1212   K 3.9 02/06/2017 0742   CL 107 09/07/2018 1212   CO2 25 09/07/2018 1212   CO2 22 02/06/2017 0742   BUN 16 09/07/2018 1212   BUN 13.5 02/06/2017 0742   CREATININE 0.93 09/07/2018 1212   CREATININE 0.8 02/06/2017 0742      Component Value Date/Time   CALCIUM 9.4 09/07/2018 1212   CALCIUM 8.5 02/06/2017 0742   ALKPHOS 120 09/07/2018 1212   ALKPHOS 130 02/06/2017 0742   AST 32 09/07/2018 1212   AST 47 (H) 02/06/2017 0742   ALT 33 09/07/2018 1212   ALT 54 02/06/2017 0742   BILITOT 1.0 09/07/2018 1212   BILITOT 0.65 02/06/2017 0742       All questions were answered. The patient knows to call the clinic with any problems, questions or concerns. No barriers to learning was  detected.  I  spent 25 minutes counseling the patient face to face. The total time spent in the appointment was 30 minutes and more than 50% was on counseling and review of test results  [name], MD 09/08/2018 7:59 AM

## 2018-09-08 NOTE — Assessment & Plan Note (Signed)
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 She is not symptomatic.  Recommend close observation only

## 2018-09-08 NOTE — Telephone Encounter (Signed)
Patient called and advised lab results as directed below. Patient has CT scheduled at [facility] on Tuesday 8/4. Patient confirms CT appt. And will arrive 2 hours early to drink water based contrast.

## 2018-09-08 NOTE — Assessment & Plan Note (Signed)
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

## 2018-09-08 NOTE — Telephone Encounter (Signed)
-----   Message from [name], MD sent at 09/08/2018  8:05 AM EDT ----- Regarding: CA-125 Her tumor marker is grossly elevated I just finished my notes to try to get CT arranged Please call her and let her know I will see her back once we know when CT is scheduled

## 2018-09-15 ENCOUNTER — Ambulatory Visit (HOSPITAL_COMMUNITY)
Admission: RE | Admit: 2018-09-15 | Discharge: 2018-09-15 | Disposition: A | Discharge status: Home / Self Care | Payer: Medicare Other | Source: Ambulatory Visit | Attending: Hematology and Oncology | Admitting: Hematology and Oncology

## 2018-09-15 ENCOUNTER — Encounter (HOSPITAL_COMMUNITY): Payer: Self-pay

## 2018-09-15 ENCOUNTER — Other Ambulatory Visit: Payer: Self-pay

## 2018-09-15 DIAGNOSIS — K7460 Unspecified cirrhosis of liver: Secondary | ICD-10-CM | POA: Diagnosis present

## 2018-09-15 DIAGNOSIS — K7581 Nonalcoholic steatohepatitis (NASH): Secondary | ICD-10-CM | POA: Insufficient documentation

## 2018-09-15 DIAGNOSIS — C482 Malignant neoplasm of peritoneum, unspecified: Secondary | ICD-10-CM | POA: Insufficient documentation

## 2018-09-15 MED ORDER — SODIUM CHLORIDE (PF) 0.9 % IJ SOLN
INTRAMUSCULAR | Status: AC
  Filled 2018-09-15: qty 50

## 2018-09-15 MED ORDER — IOHEXOL 300 MG/ML  SOLN
30.0000 mL | Freq: Once | INTRAMUSCULAR | Status: AC | PRN
  Administered 2018-09-15: 30 mL via ORAL

## 2018-09-15 MED ORDER — IOHEXOL 300 MG/ML  SOLN
100.0000 mL | Freq: Once | INTRAMUSCULAR | Status: AC | PRN
  Administered 2018-09-15: 100 mL via INTRAVENOUS

## 2018-09-15 MED ORDER — HEPARIN SOD (PORK) LOCK FLUSH 100 UNIT/ML IV SOLN
INTRAVENOUS | Status: AC
  Administered 2018-09-15: 500 [IU]
  Filled 2018-09-15: qty 5

## 2018-09-17 ENCOUNTER — Other Ambulatory Visit: Payer: Self-pay

## 2018-09-17 ENCOUNTER — Inpatient Hospital Stay: Payer: Medicare Other | Attending: Hematology and Oncology | Admitting: Hematology and Oncology

## 2018-09-17 VITALS — BP 146/55 | HR 58 | Temp 98.2°F | Resp 18 | Ht 61.5 in | Wt 185.4 lb

## 2018-09-17 DIAGNOSIS — Z5111 Encounter for antineoplastic chemotherapy: Secondary | ICD-10-CM | POA: Insufficient documentation

## 2018-09-17 DIAGNOSIS — M545 Low back pain: Secondary | ICD-10-CM | POA: Insufficient documentation

## 2018-09-17 DIAGNOSIS — Z794 Long term (current) use of insulin: Secondary | ICD-10-CM | POA: Diagnosis not present

## 2018-09-17 DIAGNOSIS — C482 Malignant neoplasm of peritoneum, unspecified: Secondary | ICD-10-CM | POA: Insufficient documentation

## 2018-09-17 DIAGNOSIS — R161 Splenomegaly, not elsewhere classified: Secondary | ICD-10-CM | POA: Insufficient documentation

## 2018-09-17 DIAGNOSIS — Z9221 Personal history of antineoplastic chemotherapy: Secondary | ICD-10-CM | POA: Insufficient documentation

## 2018-09-17 DIAGNOSIS — K7581 Nonalcoholic steatohepatitis (NASH): Secondary | ICD-10-CM | POA: Diagnosis not present

## 2018-09-17 DIAGNOSIS — Z7189 Other specified counseling: Secondary | ICD-10-CM | POA: Diagnosis not present

## 2018-09-17 DIAGNOSIS — D61818 Other pancytopenia: Secondary | ICD-10-CM | POA: Insufficient documentation

## 2018-09-17 DIAGNOSIS — Z79899 Other long term (current) drug therapy: Secondary | ICD-10-CM | POA: Diagnosis not present

## 2018-09-17 DIAGNOSIS — D696 Thrombocytopenia, unspecified: Secondary | ICD-10-CM

## 2018-09-17 DIAGNOSIS — K7460 Unspecified cirrhosis of liver: Secondary | ICD-10-CM | POA: Insufficient documentation

## 2018-09-17 DIAGNOSIS — R748 Abnormal levels of other serum enzymes: Secondary | ICD-10-CM | POA: Insufficient documentation

## 2018-09-18 ENCOUNTER — Encounter: Payer: Self-pay | Admitting: Hematology and Oncology

## 2018-09-18 ENCOUNTER — Other Ambulatory Visit: Payer: Self-pay | Admitting: Hematology and Oncology

## 2018-09-18 DIAGNOSIS — Z7189 Other specified counseling: Secondary | ICD-10-CM

## 2018-09-18 DIAGNOSIS — C482 Malignant neoplasm of peritoneum, unspecified: Secondary | ICD-10-CM

## 2018-09-18 MED ORDER — LIDOCAINE-PRILOCAINE 2.5-2.5 % EX CREA: TOPICAL_CREAM | CUTANEOUS | 3 refills | Status: DC

## 2018-09-18 MED ORDER — PROCHLORPERAZINE MALEATE 10 MG PO TABS: 10.0000 mg | ORAL_TABLET | Freq: Four times a day (QID) | ORAL | 1 refills | Status: DC | PRN

## 2018-09-18 MED ORDER — ONDANSETRON HCL 8 MG PO TABS: 8.0000 mg | ORAL_TABLET | Freq: Three times a day (TID) | ORAL | 1 refills | Status: DC | PRN

## 2018-09-18 NOTE — Progress Notes (Signed)
DISCONTINUE OFF PATHWAY REGIMEN - [Other Dx]   [id number]:Liposomal Doxorubicin (Doxil) 40 mg/m2  D1 + Bevacizumab 10 mg/kg D1, 15 q28 Days:   A cycle is every 28 days:     Liposomal doxorubicin      Bevacizumab   **Always confirm dose/schedule in your pharmacy ordering system**  REASON: Disease Progression PRIOR TREATMENT: Liposomal Doxorubicin (Doxil) 40 mg/m2  D1 + Bevacizumab 10 mg/kg D1, 15 q28 Days TREATMENT RESPONSE: Progressive Disease (PD)  START OFF PATHWAY REGIMEN - Other   [id number]:Carboplatin AUC=4 D1 + Gemcitabine 800 mg/m2 D1, 8 q21 Days:   A cycle is every 21 days:     Gemcitabine      Carboplatin   **Always confirm dose/schedule in your pharmacy ordering system**  Patient Characteristics: Intent of Therapy: Non-Curative / Palliative Intent, Discussed with Patient

## 2018-09-18 NOTE — Progress Notes (Signed)
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[facility]) I have reviewed multiple imaging studies with the patient She has definitive signs of cancer progression We reviewed the current guidelines Some of the risks, benefits, side effects of various treatment options were discussed with the patient Ultimately, she made an informed decision to proceed with carboplatin, gemcitabine combination chemotherapy I recommend monthly tumor marker and minimum 3 cycles of treatment before we repeat imaging study Due to her baseline pancytopenia secondary to chronic liver disease and splenomegaly, it is possible that she will be at risk of transfusion support and she agreed with the plan of care  Thrombocytopenia [facility]) 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 CA 125    Standing Status:   Standing    Number of Occurrences:   11    Standing Expiration Date:   09/18/2019    INTERVAL HISTORY: Please see below for problem oriented charting. 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  No worrisome liver mass.  There are associated portal venous collaterals and splenomegaly consistent with portal venous hypertension. 2.  No other significant upper abdominal findings   11/08/2013 Imaging    US showed ascites   11/08/2013 Initial Diagnosis   Patient had ~ 2 months of some urinary incontinence, saw Dr Ronita Hipps in 10-2013, with pelvic mass on exam    11/12/2013 Imaging   There are findings of progressive hepatic cirrhosis with a large amount of ascites and mild splenomegaly. There are venous collaterals present. 2. There is abnormal thickening of the peritoneal surface along the left mid and lower abdominal wall. There is abnormal soft tissue density in the pelvis as well which could reflect the clinically suspected ovarian malignancy but the findings are nondiagnostic. 3. There is a new small left pleural effusion. 4. There is no acute bowel abnormality.   11/18/2013 Imaging   Successful ultrasound guided diagnostic and therapeutic paracentesis yielding 4.1 liters of ascites.    11/18/2013 Pathology Results   PERITONEAL/ASCITIC FLUID (SPECIMEN 1 OF 1 COLLECTED 11/18/13): SEROUS CARCINOMA, PLEASE SEE COMMENT.   12/01/2013 Tumor Marker   Patient's tumor was tested for the following markers: CA125 Results of the tumor marker test revealed 1938   12/03/2013 Imaging   Successful ultrasound-guided therapeutic paracentesis yielding 3.6 liters of peritoneal fluid.   12/07/2013 - 05/30/2014 Chemotherapy   She received 3 cycles of carboplatin and Taxol, interrupted cycle 4 for surgery.  She subsequently completed 3 more cycles of chemotherapy after surgery   12/20/2013 Imaging   Successful ultrasound-guided diagnostic and therapeutic paracentesis yielding 2.2 liters of peritoneal fluid   12/20/2013 Pathology Results   PERITONEAL/ASCITIC FLUID(SPECIMEN 1 OF 1 COLLECTED 12/20/13): MALIGNANT CELLS CONSISTENT WITH SEROUS CARCINOMA   01/13/2014 Tumor Marker   Patient's tumor was tested for the following markers: CA125 Results of the tumor marker test revealed 227   02/07/2014 Tumor Marker  Patient's tumor was tested for the following markers: CA125 Results of the tumor marker test revealed  130   02/15/2014 Genetic Testing   Genetics testing from 02-2014 normal ([employer] panel)   02/23/2014 Imaging   Interval decrease in ascites. 2. Morphologic changes in the liver consistent with cirrhosis. 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 Portal vein remains patent at this time. 3. Persistent but decreased abnormal soft tissue attenuation tracking in the omental fat. This may be secondary to interval improvement in metastatic disease. 4. Peritoneal thickening seen in the left abdomen and pelvis on the previous study has decreased and nearly resolved in the interval. This also suggests interval improvement in metastatic disease.    03/07/2014 Procedure   Ultrasound and fluoroscopically guided right internal jugular single lumen power port catheter insertion. Tip in the SVC/RA junction. Catheter ready for use.   03/17/2014 Tumor Marker   Patient's tumor was tested for the following markers: CA125 Results of the tumor marker test revealed 45   03/29/2014 Pathology Results   1. Omentum, resection for tumor - HIGH GRADE SEROUS CARCINOMA, SEE COMMENT. 2. Ovary and fallopian tube, right - HIGH GRADE SEROUS CARCINOMA, SEE COMMENT. 3. Ovary and fallopian tube, left - HIGH GRADE SEROUS CARCINOMA, SEE COMMENT. Diagnosis Note 1. Nests and clusters of malignant cells are invading the omental tissue (part #1) with associated fibrosis. The cells are pleomorphic with prominent nucleoli. There are scattered psammoma bodies. The ovaries are atrophic and exhibit multiple foci of surface based invasive carcinoma and associated fibrosis. The fallopian tubes have a few foci of carcinoma, also superficially located. There are no precursor lesions noted in the ovaries or fallopian tubes (the entire tubes and ovaries were submitted for evaluation). While there is some retraction artifact, there are several foci suspicious for lymphovascular invasion. 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 Given the fibrosis, there does appear to be a small amount of treatment effect, however, there is abundant residual tumor.    03/29/2014 Surgery   Procedure(s) Performed: 1. Exploratory laparotomy with bilateral salpingo-oophorectomy, omentectomy radical tumor debulking for ovarian cancer .  Surgeon: Thereasa Solo, MD.  Assistant Surgeon: Lahoma Crocker, M.D. Assistant: (an MD assistant was necessary for tissue manipulation, retraction and positioning due to the complexity of the case and hospital policies).  Operative Findings: 10cm omental cake from hepatic to splenic flexure, densely adherent to transverse colon. Milliary studding of tumor implants (<57m, too numerous in number to count) adherent to the mesentery of the small bowel and small bowel wall. Small volume (100cc) ascites. Small ovaries bilaterally, densely adherent to the pelvic cul de sac. Left ovary and tube densely adherent to sigmoid colon. Cirrhotic liver with hepatomegaly and splenomegaly.     This represented an optimal cytoreduction (R1) with visible disease residual on the bowel wall and mesentery (millial, <327mimplants).     04/18/2014 Tumor Marker   Patient's tumor was tested for the following markers: CA125 Results of the tumor marker test revealed 122   05/16/2014 Tumor Marker   Patient's tumor was tested for the following markers: CA125 Results of the tumor marker test revealed 30   06/10/2014 Tumor Marker   Patient's tumor was tested for the following markers: CA125 Results of the tumor marker test revealed 19   07/04/2014 Imaging   Interval improvement in the appearance of peritoneal metastasis secondary  to ovarian cancer. 2. 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    08/18/2014 Tumor Marker   Patient's tumor was tested for the following  markers: CA125 Results of the tumor marker test revealed 16   10/13/2014 Tumor Marker   Patient's tumor was tested for the following markers: CA125 Results of the tumor marker test revealed 14   11/21/2014 Tumor Marker   Patient's tumor was tested for the following markers: CA125 Results of the tumor marker test revealed 16   12/28/2014 Tumor Marker   Patient's tumor was tested for the following markers: CA125 Results of the tumor marker test revealed 762   02/27/2015 Tumor Marker   Patient's tumor was tested for the following markers: CA125 Results of the tumor marker test revealed 20.2   03/22/2015 Imaging   Filler appearance to the prior exam, with very subtle fluid tracking along portions of the liver, and very minimal nodularity along the right paracolic gutter representing residua from the prior peritoneal cancer. The current abnormalities could simply be post therapy findings rather than necessarily representing residual malignancy. No new or enlarging lesions are identified. 2. Hepatic cirrhosis and splenomegaly. There is some gastric varices suggesting portal venous hypertension. 3. Left foraminal stenosis at L4-5 due to spurring. There is likely also some central narrowing of the thecal sac at this level. 4. Bibasilar scarring. 5. Chronic mild left mid kidney scarring   03/27/2015 Tumor Marker   Patient's tumor was tested for the following markers: CA125 Results of the tumor marker test revealed 25.3   04/24/2015 Imaging   Disc bulge L2-3 with mild spinal stenosis and right foraminal encroachment. 2. Disc bulge L3-4 with mild spinal stenosis. 3. Multifactorial spinal, left lateral recess and foraminal stenosis L4-5. There is grade 1 anterolisthesis without evident dynamic instability.   05/29/2015 Tumor Marker   Patient's tumor was tested for the following markers: CA125 Results of the tumor marker test revealed 29.9   08/21/2015 Tumor Marker   Patient's tumor was tested for the  following markers: CA125 Results of the tumor marker test revealed 45   09/18/2015 Tumor Marker   Patient's tumor was tested for the following markers: CA125 Results of the tumor marker test revealed 47   09/27/2015 Imaging   New small bowel mesenteric nodule and enlarged left external iliac lymph node, highly worrisome for metastatic disease. 2. Cirrhosis and splenomegaly   10/10/2015 Tumor Marker   Patient's tumor was tested for the following markers: CA125 Results of the tumor marker test revealed 53.4   10/10/2015 - 12/11/2015 Chemotherapy   She received 4 cycles of carboplatin only   11/20/2015 Tumor Marker   Patient's tumor was tested for the following markers: CA125 Results of the tumor marker test revealed 41.3   12/20/2015 Imaging   CT: Mild left external iliac lymphadenopathy is slightly decreased. Mildly enlarged lower mesenteric nodule is slightly decreased. 2. No new or progressive metastatic disease in the abdomen or pelvis. 3. Cirrhosis. Stable subcentimeter hypodense left liver lobe lesion. No new liver lesions. 4. Stable mild splenomegaly.  No ascites. 5. Mild sigmoid diverticulosis.   01/01/2016 -  Anti-estrogen oral therapy   She was placed on tamoxifen. Had letrozole initially but switched to tamoxifen due to poor tolerance   01/15/2016 Tumor Marker   Patient's tumor was tested for the following markers: CA125 Results of the tumor marker test revealed 31.4   02/19/2016 Tumor Marker   Patient's tumor was tested for the following markers: CA125 Results of  the tumor marker test revealed 36.5   05/13/2016 Imaging   No evidence of disease progression within the abdomen or pelvis. Previously noted small central mesenteric nodule and left external iliac node appear slightly smaller. 2. Stable changes of hepatic cirrhosis and portal hypertension with associated splenomegaly. No new or enlarging hepatic lesions are identified. 3. No acute findings.   05/13/2016 Tumor Marker    Patient's tumor was tested for the following markers: CA125 Results of the tumor marker test revealed 41.2   06/24/2016 Tumor Marker   Patient's tumor was tested for the following markers: CA125 Results of the tumor marker test revealed 47.3   08/20/2016 Imaging   1. The left external iliac lymph node is slightly increased in size compared to the prior exam, currently 1.1 cm and previously 0.9 cm in diameter. 2. Stable central mesenteric lymph node at 0.9 cm in diameter. 3. Stable trace free pelvic fluid and stable slight thickening along the right paracolic gutter, without well-defined peritoneal nodularity. 4. Other imaging findings of potential clinical significance: Subsegmental atelectasis or scarring in the lung bases. Hepatic cirrhosis. Left renal scarring. Mild splenomegaly. Sigmoid colon diverticulosis. Impingement at L4-5 due to spondylosis and degenerative disc disease broad Schmorl' s nodes at L3-L4. Pelvic floor laxity   08/23/2016 Tumor Marker   Patient's tumor was tested for the following markers: CA125 Results of the tumor marker test revealed 80.2   09/04/2016 Imaging   LV EF: 60% -   65%   09/12/2016 Tumor Marker   Patient's tumor was tested for the following markers: CA125 Results of the tumor marker test revealed 98.5   09/12/2016 - 01/29/2018 Chemotherapy   The patient received Doxil and Avastin. Avastin was discontinued in Dec 2019 due to GI hemorrhage   09/26/2016 Tumor Marker   Patient's tumor was tested for the following markers: CA125 Results of the tumor marker test revealed 68.9   10/10/2016 Tumor Marker   Patient's tumor was tested for the following markers: CA125 Results of the tumor marker test revealed 65.6   11/07/2016 Tumor Marker   Patient's tumor was tested for the following markers: CA125 Results of the tumor marker test revealed 46.4   11/29/2016 Imaging   Stable mild peritoneal thickening, suspicious for peritoneal carcinomatosis. No new or  progressive disease identified. No evidence of ascites.  No significant change in 10 mm left external iliac and 8 mm mesenteric lymph nodes.  Stable hepatic cirrhosis, and splenomegaly consistent with portal venous hypertension.  Colonic diverticulosis, without radiographic evidence of diverticulitis.   12/02/2016 Imaging   Normal LV size with EF 55%. Basal inferior and basal inferoseptal hypokinesis. Normal RV size and systolic function. No significant valvular abnormalities.   12/05/2016 Tumor Marker   Patient's tumor was tested for the following markers: CA125 Results of the tumor marker test revealed 49.1   01/09/2017 Tumor Marker   Patient's tumor was tested for the following markers: CA125 Results of the tumor marker test revealed 47.2   02/06/2017 Tumor Marker   Patient's tumor was tested for the following markers: CA125 Results of the tumor marker test revealed 44.1   02/18/2017 Imaging   Stable mild peritoneal thickening. No new or progressive disease identified within the abdomen or pelvis.  Stable tiny sub-cm left external iliac and mesenteric lymph nodes.  Stable hepatic cirrhosis, and splenomegaly consistent with portal venous hypertension. No evidence of hepatic neoplasm.  Colonic diverticulosis, without radiographic evidence of diverticulitis.   03/06/2017 Tumor Marker   Patient's tumor was tested  for the following markers: CA125 Results of the tumor marker test revealed 50.4   03/17/2017 Imaging   - Left ventricle: The cavity size was normal. Wall thickness was normal. Systolic function was normal. The estimated ejection fraction was in the range of 55% to 60%. Wall motion was normal; there were no regional wall motion abnormalities. Features are consistent with a pseudonormal left ventricular filling pattern, with concomitant abnormal relaxation and increased filling pressure (grade 2 diastolic dysfunction). - Mitral valve: There was mild regurgitation. - Left  atrium: The atrium was mildly dilated   04/03/2017 Tumor Marker   Patient's tumor was tested for the following markers: CA125 Results of the tumor marker test revealed 47.2   05/14/2017 Imaging   CT scan of abdomen and pelvis Stable mild peritoneal thickening. No new or progressive disease within the abdomen or pelvis.  Stable hepatic cirrhosis. Stable splenomegaly, consistent with portal venous hypertension. No evidence of hepatic neoplasm.   05/15/2017 Tumor Marker   Patient's tumor was tested for the following markers: CA125 Results of the tumor marker test revealed 56.1   06/12/2017 Tumor Marker   Patient's tumor was tested for the following markers: CA125 Results of the tumor marker test revealed 49.2   07/10/2017 Tumor Marker   Patient's tumor was tested for the following markers: CA125 Results of the tumor marker test revealed 46.3   08/07/2017 Tumor Marker   Patient's tumor was tested for the following markers: CA125 Results of the tumor marker test revealed 52.9   08/20/2017 Imaging   1. Mild omental/peritoneal haziness, unchanged. No evidence of new metastatic disease. 2. Cirrhosis with splenomegaly.   09/04/2017 Tumor Marker   Patient's tumor was tested for the following markers: CA125 Results of the tumor marker test revealed 48.5   11/13/2017 Tumor Marker   Patient's tumor was tested for the following markers: CA125 Results of the tumor marker test revealed 55.8   12/17/2017 Imaging   1. No findings to suggest metastatic disease in the abdomen or pelvis. 2. Severe hepatic cirrhosis with evidence of portal hypertension, as demonstrated by dilated portal vein, splenomegaly and portosystemic collateral pathways, including gastric and esophageal varices.  3. Colonic diverticulosis without evidence of acute diverticulitis at this time. 4. Additional incidental findings, as above.   01/16/2018 Tumor Marker   Patient's tumor was tested for the following markers: CA125 Results  of the tumor marker test revealed 63.7   02/10/2018 Procedure   EGD - Recently bleeding grade III and large (> 5 mm) esophageal varices. Completely eradicated. Banded. - Portal hypertensive gastropathy. - Normal examined duodenum. - No specimens collected   02/13/2018 Tumor Marker   Patient's tumor was tested for the following markers: CA125 Results of the tumor marker test revealed 82.7   02/25/2018 Imaging   Bone density showed mild osteopenia   04/08/2018 Tumor Marker   Patient's tumor was tested for the following markers: CA125 Results of the tumor marker test revealed 82.7   04/08/2018 Imaging   1. No definite omental or peritoneal surface lesions. However, there are 2 slowly enlarging lymph nodes noted. One is in the small bowel mesentery and the other is in the left deep pelvis. Could not exclude recurrent disease. 2. Stable advanced cirrhotic changes involving the liver with portal venous hypertension, portal venous collaterals, esophageal varices and splenomegaly. No worrisome hepatic lesions. 3. Diffuse wall thickening of the colon could suggest diffuse colitis or could be due to low albumin. Recommend correlation with any symptoms such as diarrhea.  05/21/2018 Tumor Marker   Patient's tumor was tested for the following markers: CA125 Results of the tumor marker test revealed 86   09/07/2018 Tumor Marker   Patient's tumor was tested for the following markers:CA-125 Results of the tumor marker test revealed 111   09/29/2018 -  Chemotherapy   The patient had palonosetron (ALOXI) injection 0.25 mg, 0.25 mg, Intravenous,  Once, 0 of 3 cycles CARBOplatin (PARAPLATIN) 380 mg in sodium chloride 0.9 % 100 mL chemo infusion, 380 mg (100 % of original dose 378 mg), Intravenous,  Once, 0 of 3 cycles Dose modification:   (original dose 378 mg, Cycle 1) gemcitabine (GEMZAR) 1,520 mg in sodium chloride 0.9 % 100 mL chemo infusion, 800 mg/m2 = 1,520 mg, Intravenous,  Once, 0 of 3  cycles fosaprepitant (EMEND) 150 mg, dexamethasone (DECADRON) 12 mg in sodium chloride 0.9 % 145 mL IVPB, , Intravenous,  Once, 0 of 3 cycles  for chemotherapy treatment.      REVIEW OF SYSTEMS:   Constitutional: Denies fevers, chills or abnormal weight loss Eyes: Denies blurriness of vision Ears, nose, mouth, throat, and face: Denies mucositis or sore throat Respiratory: Denies cough, dyspnea or wheezes Cardiovascular: Denies palpitation, chest discomfort or lower extremity swelling Gastrointestinal:  Denies nausea, heartburn or change in bowel habits Skin: Denies abnormal skin rashes Lymphatics: Denies new lymphadenopathy or easy bruising Neurological:Denies numbness, tingling or new weaknesses Behavioral/Psych: Mood is stable, no new changes  All other systems were reviewed with the patient and are negative.  I have reviewed the past medical history, past surgical history, social history and family history with the patient and they are unchanged from previous note.  ALLERGIES:  is allergic to shellfish allergy; benadryl [diphenhydramine hcl]; penicillins; and tape.  MEDICATIONS:  Current Outpatient Medications  Medication Sig Dispense Refill  . cholecalciferol (VITAMIN D) 1000 units tablet Take 1,000 Units by mouth at bedtime.     . gabapentin (NEURONTIN) 300 MG capsule TAKE 2 CAPSULES(600 MG) BY MOUTH TWICE DAILY 120 capsule 11  . hydrochlorothiazide (MICROZIDE) 12.5 MG capsule Take 12.5 mg by mouth daily after breakfast.   1  . HYDROcodone-acetaminophen (NORCO/VICODIN) 5-325 MG tablet Take 1 tablet by mouth every 6 (six) hours as needed for moderate pain. 30 tablet 0  . insulin degludec (TRESIBA FLEXTOUCH) 100 UNIT/ML SOPN FlexTouch Pen Inject 18 Units into the skin daily after breakfast.     . lidocaine-prilocaine (EMLA) cream Apply to Porta-cath  1-2 hours prior to access as directed. (Patient taking differently: Apply 1 application topically daily as needed (port access). ) 30  g 9  . lidocaine-prilocaine (EMLA) cream Apply to affected area once 30 g 3  . nadolol (CORGARD) 20 MG tablet Take 1 tablet (20 mg total) by mouth daily. (Patient taking differently: Take 20 mg by mouth at bedtime. ) 90 tablet 3  . ondansetron (ZOFRAN) 8 MG tablet Take 1 tablet (8 mg total) by mouth every 8 (eight) hours as needed. Start on the third day after chemotherapy. 30 tablet 1  . pantoprazole (PROTONIX) 40 MG tablet Take 1 tablet (40 mg total) by mouth daily.    . prochlorperazine (COMPAZINE) 10 MG tablet Take 1 tablet (10 mg total) by mouth every 6 (six) hours as needed (Nausea or vomiting). 30 tablet 1  . rotigotine (NEUPRO) 4 MG/24HR APPLY 1 PATCH EXTERNALLY TO THE SKIN DAILY 30 patch 5   No current facility-administered medications for this visit.     PHYSICAL EXAMINATION: ECOG PERFORMANCE STATUS: 1 -  Symptomatic but completely ambulatory  Vitals:   09/17/18 1023  BP: (!) 146/55  Pulse: (!) 58  Resp: 18  Temp: 98.2 F (36.8 C)  SpO2: 98%   Filed Weights   09/17/18 1023  Weight: 185 lb 6.4 oz (84.1 kg)    GENERAL:alert, no distress and comfortable Musculoskeletal:no cyanosis of digits and no clubbing  NEURO: alert & oriented x 3 with fluent speech, no focal motor/sensory deficits  LABORATORY DATA:  I have reviewed the data as listed    Component Value Date/Time   NA 141 09/07/2018 1212   NA 142 02/06/2017 0742   K 4.2 09/07/2018 1212   K 3.9 02/06/2017 0742   CL 107 09/07/2018 1212   CO2 25 09/07/2018 1212   CO2 22 02/06/2017 0742   GLUCOSE 105 (H) 09/07/2018 1212   GLUCOSE 156 (H) 02/06/2017 0742   BUN 16 09/07/2018 1212   BUN 13.5 02/06/2017 0742   CREATININE 0.93 09/07/2018 1212   CREATININE 0.8 02/06/2017 0742   CALCIUM 9.4 09/07/2018 1212   CALCIUM 8.5 02/06/2017 0742   PROT 8.0 09/07/2018 1212   PROT 6.9 02/06/2017 0742   ALBUMIN 4.0 09/07/2018 1212   ALBUMIN 3.7 02/06/2017 0742   AST 32 09/07/2018 1212   AST 47 (H) 02/06/2017 0742   ALT 33  09/07/2018 1212   ALT 54 02/06/2017 0742   ALKPHOS 120 09/07/2018 1212   ALKPHOS 130 02/06/2017 0742   BILITOT 1.0 09/07/2018 1212   BILITOT 0.65 02/06/2017 0742   GFRNONAA >60 09/07/2018 1212   GFRAA >60 09/07/2018 1212    No results found for: SPEP, UPEP  Lab Results  Component Value Date   WBC 4.5 09/07/2018   NEUTROABS 2.7 09/07/2018   HGB 12.8 09/07/2018   HCT 39.3 09/07/2018   MCV 93.1 09/07/2018   PLT 91 (L) 09/07/2018      Chemistry      Component Value Date/Time   NA 141 09/07/2018 1212   NA 142 02/06/2017 0742   K 4.2 09/07/2018 1212   K 3.9 02/06/2017 0742   CL 107 09/07/2018 1212   CO2 25 09/07/2018 1212   CO2 22 02/06/2017 0742   BUN 16 09/07/2018 1212   BUN 13.5 02/06/2017 0742   CREATININE 0.93 09/07/2018 1212   CREATININE 0.8 02/06/2017 0742      Component Value Date/Time   CALCIUM 9.4 09/07/2018 1212   CALCIUM 8.5 02/06/2017 0742   ALKPHOS 120 09/07/2018 1212   ALKPHOS 130 02/06/2017 0742   AST 32 09/07/2018 1212   AST 47 (H) 02/06/2017 0742   ALT 33 09/07/2018 1212   ALT 54 02/06/2017 0742   BILITOT 1.0 09/07/2018 1212   BILITOT 0.65 02/06/2017 0742       RADIOGRAPHIC STUDIES: I have reviewed multiple imaging studies with the patient I have personally reviewed the radiological images as listed and agreed with the findings in the report. Ct Chest W Contrast  Result Date: 09/15/2018 CLINICAL DATA:  Peritoneal carcinoma diagnosed in 2015. Chemotherapy completed. No current complaints. EXAM: CT CHEST, ABDOMEN, AND PELVIS WITH CONTRAST TECHNIQUE: Multidetector CT imaging of the chest, abdomen and pelvis was performed following the standard protocol during bolus administration of intravenous contrast. CONTRAST:  175m OMNIPAQUE IOHEXOL 300 MG/ML  SOLN COMPARISON:  CT 04/08/2018, 12/17/2017 and 08/20/2016. FINDINGS: CT CHEST FINDINGS Cardiovascular: Minimal atherosclerosis of the aortic arch. Right IJ Port-A-Cath extends to the superior cavoatrial  junction. The heart size is normal. There is no pericardial effusion. Mediastinum/Nodes: There  are no enlarged mediastinal, hilar or axillary lymph nodes. The thyroid gland, trachea and esophagus demonstrate no significant findings. Lungs/Pleura: There is no pleural effusion or pneumothorax. Chronic linear scarring at both lung bases is similar to previous studies. No suspicious pulmonary nodules. Musculoskeletal/Chest wall: No chest wall mass or suspicious osseous findings. Bilateral breast implants are in place. CT ABDOMEN AND PELVIS FINDINGS Hepatobiliary: There are stable morphologic changes of cirrhosis within the liver. No focal lesion or abnormal enhancement identified. No evidence of gallstones, gallbladder wall thickening or biliary dilatation. Pancreas: Unremarkable. No pancreatic ductal dilatation or surrounding inflammatory changes. Spleen: Stable mild splenomegaly. No focal abnormality. Probable small splenule at the splenic hilum. Adrenals/Urinary Tract: Both adrenal glands appear normal. Stable cortical scarring in the upper pole of the left kidney. There is no evidence of renal mass, urinary tract calculus or hydronephrosis. The bladder appears normal. Stomach/Bowel: No evidence of bowel wall thickening, distention or surrounding inflammatory change. Sigmoid diverticular changes are again noted. Vascular/Lymphatic: There is progressive adenopathy and peritoneal nodularity. 2.1 x 1.1 cm node in the gastrohepatic ligament on image 54/2 has slowly grown compared with prior studies. There is an 11 mm soft tissue nodule posterior to the lower pole of the right kidney on image 80/2. Central mesenteric node measuring 10 mm on image 86/2 has slowly grown from previous studies. The left pelvic soft tissue nodule measures 2.7 x 2.3 cm on image 104/2, most recently 1.9 x 1.6 cm. 12 mm left external iliac node on image 103/2 has not significantly changed. Mild aortic and branch vessel atherosclerosis and splenic  vein collaterals are again noted. No acute vascular findings. Reproductive: Hysterectomy. As above, enlarging soft tissue nodule adjacent to the vaginal cuff on the left. Other: As above, increasing peritoneal nodularity and lymphadenopathy. No ascites or generalized omental caking. Musculoskeletal: No acute or significant osseous findings. Stable lumbar spondylosis with Schmorl's node formation in the superior endplate of L3. IMPRESSION: 1. Progressive enlargement of abdominopelvic lymph nodes and peritoneal nodules, largest along the vaginal cuff on the left. Findings are concerning for progressive metastatic disease. 2. No evidence of thoracic metastatic disease or solid organ involvement in the abdomen. 3. Stable morphologic changes of cirrhosis.  No acute findings. Electronically Signed   By: Richardean Sale M.D.   On: 09/15/2018 12:48   Ct Abdomen Pelvis W Contrast  Result Date: 09/15/2018 CLINICAL DATA:  Peritoneal carcinoma diagnosed in 2015. Chemotherapy completed. No current complaints. EXAM: CT CHEST, ABDOMEN, AND PELVIS WITH CONTRAST TECHNIQUE: Multidetector CT imaging of the chest, abdomen and pelvis was performed following the standard protocol during bolus administration of intravenous contrast. CONTRAST:  150m OMNIPAQUE IOHEXOL 300 MG/ML  SOLN COMPARISON:  CT 04/08/2018, 12/17/2017 and 08/20/2016. FINDINGS: CT CHEST FINDINGS Cardiovascular: Minimal atherosclerosis of the aortic arch. Right IJ Port-A-Cath extends to the superior cavoatrial junction. The heart size is normal. There is no pericardial effusion. Mediastinum/Nodes: There are no enlarged mediastinal, hilar or axillary lymph nodes. The thyroid gland, trachea and esophagus demonstrate no significant findings. Lungs/Pleura: There is no pleural effusion or pneumothorax. Chronic linear scarring at both lung bases is similar to previous studies. No suspicious pulmonary nodules. Musculoskeletal/Chest wall: No chest wall mass or suspicious  osseous findings. Bilateral breast implants are in place. CT ABDOMEN AND PELVIS FINDINGS Hepatobiliary: There are stable morphologic changes of cirrhosis within the liver. No focal lesion or abnormal enhancement identified. No evidence of gallstones, gallbladder wall thickening or biliary dilatation. Pancreas: Unremarkable. No pancreatic ductal dilatation or surrounding inflammatory changes.  Spleen: Stable mild splenomegaly. No focal abnormality. Probable small splenule at the splenic hilum. Adrenals/Urinary Tract: Both adrenal glands appear normal. Stable cortical scarring in the upper pole of the left kidney. There is no evidence of renal mass, urinary tract calculus or hydronephrosis. The bladder appears normal. Stomach/Bowel: No evidence of bowel wall thickening, distention or surrounding inflammatory change. Sigmoid diverticular changes are again noted. Vascular/Lymphatic: There is progressive adenopathy and peritoneal nodularity. 2.1 x 1.1 cm node in the gastrohepatic ligament on image 54/2 has slowly grown compared with prior studies. There is an 11 mm soft tissue nodule posterior to the lower pole of the right kidney on image 80/2. Central mesenteric node measuring 10 mm on image 86/2 has slowly grown from previous studies. The left pelvic soft tissue nodule measures 2.7 x 2.3 cm on image 104/2, most recently 1.9 x 1.6 cm. 12 mm left external iliac node on image 103/2 has not significantly changed. Mild aortic and branch vessel atherosclerosis and splenic vein collaterals are again noted. No acute vascular findings. Reproductive: Hysterectomy. As above, enlarging soft tissue nodule adjacent to the vaginal cuff on the left. Other: As above, increasing peritoneal nodularity and lymphadenopathy. No ascites or generalized omental caking. Musculoskeletal: No acute or significant osseous findings. Stable lumbar spondylosis with Schmorl's node formation in the superior endplate of L3. IMPRESSION: 1. Progressive  enlargement of abdominopelvic lymph nodes and peritoneal nodules, largest along the vaginal cuff on the left. Findings are concerning for progressive metastatic disease. 2. No evidence of thoracic metastatic disease or solid organ involvement in the abdomen. 3. Stable morphologic changes of cirrhosis.  No acute findings. Electronically Signed   By: Richardean Sale M.D.   On: 09/15/2018 12:48    All questions were answered. The patient knows to call the clinic with any problems, questions or concerns. No barriers to learning was detected.  I spent 30 minutes counseling the patient face to face. The total time spent in the appointment was 40 minutes and more than 50% was on counseling and review of test results  [name], MD 09/18/2018 2:54 PM

## 2018-09-18 NOTE — Assessment & Plan Note (Signed)
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

## 2018-09-18 NOTE — Assessment & Plan Note (Signed)
I have reviewed multiple imaging studies with the patient She has definitive signs of cancer progression We reviewed the current guidelines Some of the risks, benefits, side effects of various treatment options were discussed with the patient Ultimately, she made an informed decision to proceed with carboplatin, gemcitabine combination chemotherapy I recommend monthly tumor marker and minimum 3 cycles of treatment before we repeat imaging study Due to her baseline pancytopenia secondary to chronic liver disease and splenomegaly, it is possible that she will be at risk of transfusion support and she agreed with the plan of care

## 2018-09-18 NOTE — Assessment & Plan Note (Signed)
We had numerous goals of care discussion in the past She understood that treatment goal is palliative

## 2018-09-21 ENCOUNTER — Telehealth: Payer: Self-pay | Admitting: Hematology and Oncology

## 2018-09-21 NOTE — Telephone Encounter (Signed)
Scheduled appt per 8/07 sch message - spoke with patient and she is aware of appts added.

## 2018-09-23 ENCOUNTER — Telehealth: Payer: Self-pay

## 2018-09-23 ENCOUNTER — Other Ambulatory Visit: Payer: Self-pay

## 2018-09-23 DIAGNOSIS — Z7189 Other specified counseling: Secondary | ICD-10-CM

## 2018-09-23 DIAGNOSIS — C482 Malignant neoplasm of peritoneum, unspecified: Secondary | ICD-10-CM

## 2018-09-23 NOTE — Telephone Encounter (Signed)
Received call from representative at [facility] stating lidocaine cream has been approved for pt as of 09/22/18 for 1 yr. Rep states she will call pt to inform.

## 2018-09-23 NOTE — Telephone Encounter (Signed)
VM received from [facility] stating that ondansetron has been approved for pt.

## 2018-09-24 ENCOUNTER — Other Ambulatory Visit (HOSPITAL_COMMUNITY)
Admission: RE | Admit: 2018-09-24 | Discharge: 2018-09-24 | Disposition: A | Discharge status: Home / Self Care | Payer: Medicare Other | Source: Ambulatory Visit | Attending: Gastroenterology | Admitting: Gastroenterology

## 2018-09-24 DIAGNOSIS — Z20828 Contact with and (suspected) exposure to other viral communicable diseases: Secondary | ICD-10-CM | POA: Diagnosis not present

## 2018-09-24 DIAGNOSIS — Z01812 Encounter for preprocedural laboratory examination: Secondary | ICD-10-CM | POA: Diagnosis present

## 2018-09-25 ENCOUNTER — Other Ambulatory Visit: Payer: Self-pay

## 2018-09-25 ENCOUNTER — Encounter (HOSPITAL_COMMUNITY): Payer: Self-pay | Admitting: Emergency Medicine

## 2018-09-25 LAB — SARS CORONAVIRUS 2 (TAT 6-24 HRS): SARS Coronavirus 2: NEGATIVE

## 2018-09-25 NOTE — Progress Notes (Signed)
Pre-op endo call completed.   Patient  reports elevated cbg reading at home this am, RN advised patient to f/u with PCP office or ED if she becomes symptomatic.   Patient instructed to contact endoscopy dept morning of surgery if cbg elevated .   patient verbalized understanding to above information

## 2018-09-25 NOTE — Progress Notes (Signed)
Pre-op endo call attempted. No answer. [email or web address]

## 2018-09-25 NOTE — H&P (Signed)
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 She had diarrhea and CT showed colitis, she was given cipro and flagyl. She currently has 1 loose BM a day. The day after EGD on 03/24/2018 she had explosive diarrhea, multiple times and she had the urge and diffuse abdominal pain. Post EGD with banding she had chest pain for 2 weeks, associated with painful swallowing. Labs from 04/08/2018 showed unremarkable CMP except AST minimally elevated at 48, normal CBC except low platelet 84, hemoglobin was 12.7.   Vital Signs  Wt 173(PER PATIENT-REFUSED WEIGHT DUE TO BOOT), Ht PATIENT REFUSED, Temp 97.6, Pulse sitting 63, BP sitting 145/71.   Examination  Gastroenterology:: GENERAL APPEARANCE: Well developed, overeweight, no active distress, pleasant.  EYES: Lids and conjunctiva normal. Sclera normal.  ORAL CAVITY: Lips, teeth and gums are normal. Pharynx, tongue, mucosa normal .  SCLERA: anicteric .  CARDIOVASCULAR RRR no murmur, Normal RRR w/o murmers or gallops. No peripheral edema .  RESPIRATORY Breath sounds normal. Respiration even and unlabored .  ABDOMEN No masses palpated. Liver and spleen not palpated, normal. Bowel sounds normal, Abdomen not distended .  EXTREMITIES: right foot in a brace.  NEURO: uses a walker for ambulation.  PSYCH: mood/affect normal .     Assessments   1. Esophageal varices determined by endoscopy - I85.00 (Primary)   2. Other cirrhosis of liver - K74.69   3. Colitis - K52.9   4. Abnormal CT of the abdomen - R93.5   Treatment  1. Esophageal varices determined by endoscopy  IMAGING: Esophagoscopy       Notes: Will need to continue EGD for banding, until varice are eradicated, as she had variceal bleeding and alothoug she take nadolol, varices appeared large on last EGD and on recent CT. After the  next EGD with banding, will likely need carafate suspension for 2 weeks.      2. Other cirrhosis of liver  Notes: No liver masses noted, INR was 1.26 on 03/08/2018, TB and Cr were normal on 04/08/2018.    3. Colitis          4. Abnormal CT of the abdomen           Ronnette Juniper, MD

## 2018-09-28 ENCOUNTER — Ambulatory Visit (HOSPITAL_COMMUNITY): Payer: Medicare Other | Admitting: Certified Registered Nurse Anesthetist

## 2018-09-28 ENCOUNTER — Encounter (HOSPITAL_COMMUNITY): Payer: Self-pay | Admitting: Gastroenterology

## 2018-09-28 ENCOUNTER — Ambulatory Visit (HOSPITAL_COMMUNITY)
Admission: RE | Admit: 2018-09-28 | Discharge: 2018-09-28 | Disposition: A | Discharge status: Home / Self Care | Payer: Medicare Other | Attending: Gastroenterology | Admitting: Gastroenterology

## 2018-09-28 ENCOUNTER — Other Ambulatory Visit: Payer: Self-pay

## 2018-09-28 ENCOUNTER — Encounter (HOSPITAL_COMMUNITY)
Admission: RE | Disposition: A | Discharge status: Home / Self Care | Payer: Self-pay | Source: Home / Self Care | Attending: Gastroenterology

## 2018-09-28 DIAGNOSIS — H409 Unspecified glaucoma: Secondary | ICD-10-CM | POA: Insufficient documentation

## 2018-09-28 DIAGNOSIS — K766 Portal hypertension: Secondary | ICD-10-CM | POA: Insufficient documentation

## 2018-09-28 DIAGNOSIS — J449 Chronic obstructive pulmonary disease, unspecified: Secondary | ICD-10-CM | POA: Diagnosis not present

## 2018-09-28 DIAGNOSIS — K7469 Other cirrhosis of liver: Secondary | ICD-10-CM | POA: Insufficient documentation

## 2018-09-28 DIAGNOSIS — E119 Type 2 diabetes mellitus without complications: Secondary | ICD-10-CM | POA: Insufficient documentation

## 2018-09-28 DIAGNOSIS — E669 Obesity, unspecified: Secondary | ICD-10-CM | POA: Insufficient documentation

## 2018-09-28 DIAGNOSIS — K219 Gastro-esophageal reflux disease without esophagitis: Secondary | ICD-10-CM | POA: Diagnosis not present

## 2018-09-28 DIAGNOSIS — K5730 Diverticulosis of large intestine without perforation or abscess without bleeding: Secondary | ICD-10-CM | POA: Insufficient documentation

## 2018-09-28 DIAGNOSIS — Z6835 Body mass index (BMI) 35.0-35.9, adult: Secondary | ICD-10-CM | POA: Insufficient documentation

## 2018-09-28 DIAGNOSIS — Z8543 Personal history of malignant neoplasm of ovary: Secondary | ICD-10-CM | POA: Diagnosis not present

## 2018-09-28 DIAGNOSIS — Z1211 Encounter for screening for malignant neoplasm of colon: Secondary | ICD-10-CM | POA: Insufficient documentation

## 2018-09-28 DIAGNOSIS — K3189 Other diseases of stomach and duodenum: Secondary | ICD-10-CM | POA: Insufficient documentation

## 2018-09-28 DIAGNOSIS — Z794 Long term (current) use of insulin: Secondary | ICD-10-CM | POA: Diagnosis not present

## 2018-09-28 DIAGNOSIS — Z87891 Personal history of nicotine dependence: Secondary | ICD-10-CM | POA: Diagnosis not present

## 2018-09-28 DIAGNOSIS — I10 Essential (primary) hypertension: Secondary | ICD-10-CM | POA: Insufficient documentation

## 2018-09-28 DIAGNOSIS — K635 Polyp of colon: Secondary | ICD-10-CM | POA: Diagnosis not present

## 2018-09-28 DIAGNOSIS — I8510 Secondary esophageal varices without bleeding: Secondary | ICD-10-CM | POA: Insufficient documentation

## 2018-09-28 DIAGNOSIS — K648 Other hemorrhoids: Secondary | ICD-10-CM | POA: Insufficient documentation

## 2018-09-28 HISTORY — PX: ESOPHAGOGASTRODUODENOSCOPY (EGD) WITH PROPOFOL: SHX5813

## 2018-09-28 HISTORY — PX: COLONOSCOPY WITH PROPOFOL: SHX5780

## 2018-09-28 HISTORY — PX: ESOPHAGEAL BANDING: SHX5518

## 2018-09-28 HISTORY — PX: POLYPECTOMY: SHX5525

## 2018-09-28 LAB — GLUCOSE, CAPILLARY: Glucose-Capillary: 113 mg/dL — ABNORMAL HIGH (ref 70–99)

## 2018-09-28 SURGERY — COLONOSCOPY WITH PROPOFOL
Anesthesia: Monitor Anesthesia Care

## 2018-09-28 MED ORDER — PROPOFOL 500 MG/50ML IV EMUL
INTRAVENOUS | Status: DC | PRN
  Administered 2018-09-28: 125 ug/kg/min via INTRAVENOUS

## 2018-09-28 MED ORDER — SODIUM CHLORIDE 0.9% FLUSH: 10.0000 mL | Freq: Two times a day (BID) | INTRAVENOUS | Status: DC

## 2018-09-28 MED ORDER — LACTATED RINGERS IV SOLN
INTRAVENOUS | Status: DC
  Administered 2018-09-28: 10:00:00 via INTRAVENOUS

## 2018-09-28 MED ORDER — PROPOFOL 10 MG/ML IV BOLUS
INTRAVENOUS | Status: AC
  Filled 2018-09-28: qty 20

## 2018-09-28 MED ORDER — SODIUM CHLORIDE 0.9% FLUSH: 10.0000 mL | INTRAVENOUS | Status: DC | PRN

## 2018-09-28 MED ORDER — HYDROMORPHONE HCL 1 MG/ML IJ SOLN
0.2500 mg | INTRAMUSCULAR | Status: DC | PRN
  Administered 2018-09-28 (×3): 0.25 mg via INTRAVENOUS

## 2018-09-28 MED ORDER — ONDANSETRON HCL 4 MG/2ML IJ SOLN
INTRAMUSCULAR | Status: DC | PRN
  Administered 2018-09-28: 4 mg via INTRAVENOUS

## 2018-09-28 MED ORDER — HYDROMORPHONE HCL 1 MG/ML IJ SOLN
INTRAMUSCULAR | Status: AC
  Filled 2018-09-28: qty 1

## 2018-09-28 MED ORDER — HYDROMORPHONE HCL 1 MG/ML IJ SOLN
1.0000 mg | Freq: Once | INTRAMUSCULAR | Status: AC
  Administered 2018-09-28: 1 mg via INTRAVENOUS

## 2018-09-28 MED ORDER — HEPARIN SOD (PORK) LOCK FLUSH 100 UNIT/ML IV SOLN
500.0000 [IU] | INTRAVENOUS | Status: AC | PRN
  Administered 2018-09-28: 500 [IU]

## 2018-09-28 MED ORDER — SUCRALFATE 1 GM/10ML PO SUSP: 1.0000 g | Freq: Four times a day (QID) | ORAL | 0 refills | Status: DC

## 2018-09-28 MED ORDER — SODIUM CHLORIDE 0.9 % IV SOLN: INTRAVENOUS | Status: DC

## 2018-09-28 MED ORDER — PROPOFOL 10 MG/ML IV BOLUS
INTRAVENOUS | Status: DC | PRN
  Administered 2018-09-28 (×2): 40 mg via INTRAVENOUS

## 2018-09-28 SURGICAL SUPPLY — 25 items

## 2018-09-28 NOTE — Discharge Instructions (Signed)

## 2018-09-28 NOTE — Anesthesia Preprocedure Evaluation (Addendum)
Anesthesia Evaluation  Patient identified by MRN, date of birth, ID band Patient awake    Reviewed: Allergy & Precautions, NPO status , Patient's Chart, lab work & pertinent test results, reviewed documented beta blocker date and time   History of Anesthesia Complications (+) PONV  Airway Mallampati: II  TM Distance: >3 FB Neck ROM: Full    Dental  (+) Dental Advisory Given   Pulmonary COPD (hasn't needed an inhaler in months),  COPD inhaler, former smoker (quit 1984),  09/24/2018 SARS coronavirus NEG   breath sounds clear to auscultation       Cardiovascular hypertension, Pt. on medications and Pt. on home beta blockers (-) angina Rhythm:Regular Rate:Normal  '19 ECHO: EF 55-60%, mild MR    Neuro/Psych glaucoma negative psych ROS   GI/Hepatic GERD  Medicated and Controlled,(+) Cirrhosis   Esophageal Varices and ascites    ,   Endo/Other  diabetes, Insulin DependentMorbid obesity  Renal/GU negative Renal ROS     Musculoskeletal   Abdominal (+) + obese,   Peds  Hematology   Anesthesia Other Findings   Reproductive/Obstetrics H/o ovarian cancer                            Anesthesia Physical Anesthesia Plan  ASA: III  Anesthesia Plan: MAC   Post-op Pain Management:    Induction:   PONV Risk Score and Plan: 3 and Ondansetron  Airway Management Planned: Natural Airway and Nasal Cannula  Additional Equipment:   Intra-op Plan:   Post-operative Plan:   Informed Consent: I have reviewed the patients History and Physical, chart, labs and discussed the procedure including the risks, benefits and alternatives for the proposed anesthesia with the patient or authorized representative who has indicated his/her understanding and acceptance.     Dental advisory given  Plan Discussed with: CRNA and Surgeon  Anesthesia Plan Comments:        Anesthesia Quick Evaluation

## 2018-09-28 NOTE — Anesthesia Postprocedure Evaluation (Signed)
Anesthesia Post Note  Patient: VICTOIRE DEANS  Procedure(s) Performed: COLONOSCOPY WITH PROPOFOL (N/A ) ESOPHAGOGASTRODUODENOSCOPY (EGD) WITH PROPOFOL (N/A ) ESOPHAGEAL BANDING (N/A ) POLYPECTOMY     Patient location during evaluation: Endoscopy Anesthesia Type: MAC Level of consciousness: awake and alert, oriented and patient cooperative Pain management: pain level controlled Vital Signs Assessment: post-procedure vital signs reviewed and stable Respiratory status: spontaneous breathing, nonlabored ventilation and respiratory function stable Cardiovascular status: blood pressure returned to baseline and stable Postop Assessment: no apparent nausea or vomiting Anesthetic complications: no    Last Vitals:  Vitals:   09/28/18 1320 09/28/18 1330  BP: (!) 201/64 (!) 185/59  Pulse:    Resp: 12 14  Temp:    SpO2:      Last Pain:  Vitals:   09/28/18 1328  TempSrc:   PainSc: 4                  Mikeila Burgen,E. Aaliyah Cancro

## 2018-09-28 NOTE — Op Note (Signed)
[facility] Patient Name: Olivia Benson Procedure Date: 09/28/2018 MRN: [id number] Attending MD: Ronnette Juniper , MD Date of Birth: 1945-04-05 CSN: [id number] Age: 73 Admit Type: Outpatient Procedure:                Upper GI endoscopy Indications:              For therapy of esophageal varices Providers:                Ronnette Juniper, MD, Cleda Daub, RN, Ron [place],                            Marguerita Merles, Technician Referring MD:              Medicines:                Monitored Anesthesia Care Complications:            No immediate complications. Estimated blood loss:                            Minimal. Estimated Blood Loss:     Estimated blood loss was minimal. Procedure:                Pre-Anesthesia Assessment:                           - Prior to the procedure, a History and Physical                            was performed, and patient medications and                            allergies were reviewed. The patient's tolerance of                            previous anesthesia was also reviewed. The risks                            and benefits of the procedure and the sedation                            options and risks were discussed with the patient.                            All questions were answered, and informed consent                            was obtained. Prior Anticoagulants: The patient has                            taken no previous anticoagulant or antiplatelet                            agents. ASA Grade Assessment: III - A patient with  severe systemic disease. After reviewing the risks                            and benefits, the patient was deemed in                            satisfactory condition to undergo the procedure.                           After obtaining informed consent, the endoscope was                            passed under direct vision. Throughout the                            procedure, the  patient's blood pressure, pulse, and                            oxygen saturations were monitored continuously. The                            GIF-H190 ([id number] Olympus gastroscope was                            introduced through the mouth, and advanced to the                            second part of duodenum. The upper GI endoscopy was                            accomplished without difficulty. The patient                            tolerated the procedure well. Scope In: Scope Out: Findings:      Grade III varices were found in the middle third of the esophagus and in       the lower third of the esophagus. They were medium in size. Five bands       were successfully placed with complete eradication, resulting in       deflation of varices. There was no bleeding at the end of the procedure.      The Z-line was regular and was found 37 cm from the incisors.      Mild portal hypertensive gastropathy was found in the stomach.      The cardia and gastric fundus were normal on retroflexion.      The examined duodenum was normal. Impression:               - Grade III esophageal varices. Completely                            eradicated. Banded.                           - Z-line regular, 37 cm from the incisors.                           -  Portal hypertensive gastropathy.                           - Normal examined duodenum.                           - No specimens collected. Moderate Sedation:      Patient did not receive moderate sedation for this procedure, but       instead received monitored anesthesia care. Recommendation:           - Patient has a contact number available for                            emergencies. The signs and symptoms of potential                            delayed complications were discussed with the                            patient. Return to normal activities tomorrow.                            Written discharge instructions were provided to the                             patient.                           - Resume regular diet.                           - Continue present medications.                           - Repeat upper endoscopy in 3 months for                            retreatment. Procedure Code(s):        --- Professional ---                           [phone], Esophagogastroduodenoscopy, flexible,                            transoral; with band ligation of esophageal/gastric                            varices Diagnosis Code(s):        --- Professional ---                           I85.00, Esophageal varices without bleeding                           K76.6, Portal hypertension                           K31.89, Other diseases of stomach  and duodenum CPT copyright 2019 [employer]. All rights reserved. The codes documented in this report are preliminary and upon coder review may  be revised to meet current compliance requirements. Ronnette Juniper, MD 09/28/2018 11:25:08 AM This report has been signed electronically. Number of Addenda: 0

## 2018-09-28 NOTE — Progress Notes (Signed)
Patient blood pressure elevated patient complaining of pain score 5 Dr Glennon Mac notified medication ordered.

## 2018-09-28 NOTE — Brief Op Note (Signed)
09/28/2018  11:28 AM  PATIENT:  Olivia Benson  73 y.o. female  PRE-OPERATIVE DIAGNOSIS:  abnormal abdominal CT scan, colitis, esophageal varices  POST-OPERATIVE DIAGNOSIS:  esophageal varices / hemorrhoids,diverticulosis  PROCEDURE:  Procedure(s): COLONOSCOPY WITH PROPOFOL (N/A) ESOPHAGOGASTRODUODENOSCOPY (EGD) WITH PROPOFOL (N/A) ESOPHAGEAL BANDING (N/A) POLYPECTOMY  SURGEON:  Surgeon(s) and Role:    Ronnette Juniper, MD - Primary  PHYSICIAN ASSISTANT:   ASSISTANTS: Cleda Daub, RN, Elspeth Cho, Tech  ANESTHESIA:   MAC  EBL:  Minimal  BLOOD ADMINISTERED:none  DRAINS: none   LOCAL MEDICATIONS USED:  NONE  SPECIMEN:  Colon polyps X 3  DISPOSITION OF SPECIMEN:  PATHOLOGY  COUNTS:  YES  TOURNIQUET:  * No tourniquets in log *  DICTATION: .Dragon Dictation  PLAN OF CARE: Discharge to home after PACU  PATIENT DISPOSITION:  PACU - hemodynamically stable.   Delay start of Pharmacological VTE agent (>24hrs) due to surgical blood loss or risk of bleeding: no

## 2018-09-28 NOTE — Anesthesia Procedure Notes (Signed)
Procedure Name: MAC Date/Time: 09/28/2018 10:44 AM Performed by: Claudia Desanctis, CRNA Pre-anesthesia Checklist: Patient identified Oxygen Delivery Method: Nasal cannula

## 2018-09-28 NOTE — Transfer of Care (Signed)
Immediate Anesthesia Transfer of Care Note  Patient: Olivia Benson  Procedure(s) Performed: COLONOSCOPY WITH PROPOFOL (N/A ) ESOPHAGOGASTRODUODENOSCOPY (EGD) WITH PROPOFOL (N/A ) ESOPHAGEAL BANDING (N/A ) POLYPECTOMY  Patient Location: Endoscopy Unit  Anesthesia Type:MAC  Level of Consciousness: awake and patient cooperative  Airway & Oxygen Therapy: Patient Spontanous Breathing and Patient connected to nasal cannula oxygen  Post-op Assessment: Report given to RN and Post -op Vital signs reviewed and stable  Post vital signs: Reviewed and stable  Last Vitals:  Vitals Value Taken Time  BP    Temp    Pulse    Resp    SpO2      Last Pain:  Vitals:   09/28/18 0953  TempSrc: Oral  PainSc: 0-No pain         Complications: No apparent anesthesia complications

## 2018-09-28 NOTE — Interval H&P Note (Signed)
History and Physical Interval Note: 72/female with esophageal varices for EGD with banding and colonoscopy for screening, last colonoscopy was in 2009.  09/28/2018 9:47 AM  Olivia Benson  has presented today for EGD for banding of varices and colonoscopy for screening.  The various methods of treatment have been discussed with the patient and family. After consideration of risks, benefits and other options for treatment, the patient has consented to  Procedure(s): COLONOSCOPY WITH PROPOFOL (N/A) ESOPHAGOGASTRODUODENOSCOPY (EGD) WITH PROPOFOL (N/A) ESOPHAGEAL BANDING (N/A) as a surgical intervention.  The patient's history has been reviewed, patient examined, no change in status, stable for surgery.  I have reviewed the patient's chart and labs.  Questions were answered to the patient's satisfaction.     Ronnette Juniper

## 2018-09-28 NOTE — Op Note (Signed)
[facility] Patient Name: Olivia Benson Procedure Date: 09/28/2018 MRN: [id number] Attending MD: Ronnette Juniper , MD Date of Birth: 1945-09-03 CSN: [id number] Age: 73 Admit Type: Outpatient Procedure:                Colonoscopy Indications:              Screening for colorectal malignant neoplasm, Last                            colonoscopy: 2007 Providers:                Ronnette Juniper, MD, Dellie Catholic, Cleda Daub, RN,                            Marguerita Merles, Technician Referring MD:              Medicines:                Monitored Anesthesia Care Complications:            No immediate complications. Estimated blood loss:                            Minimal. Estimated Blood Loss:     Estimated blood loss was minimal. Procedure:                Pre-Anesthesia Assessment:                           - Prior to the procedure, a History and Physical                            was performed, and patient medications and                            allergies were reviewed. The patient's tolerance of                            previous anesthesia was also reviewed. The risks                            and benefits of the procedure and the sedation                            options and risks were discussed with the patient.                            All questions were answered, and informed consent                            was obtained. Prior Anticoagulants: The patient has                            taken no previous anticoagulant or antiplatelet                            agents. ASA Grade Assessment: III -  A patient with                            severe systemic disease. After reviewing the risks                            and benefits, the patient was deemed in                            satisfactory condition to undergo the procedure.                           After obtaining informed consent, the colonoscope                            was passed under direct vision.  Throughout the                            procedure, the patient's blood pressure, pulse, and                            oxygen saturations were monitored continuously. The                            PCF-[id number] ([id number] Olympus pediatric colonscope                            was introduced through the anus and advanced to the                            the terminal ileum. The colonoscopy was performed                            without difficulty. The patient tolerated the                            procedure well. The quality of the bowel                            preparation was adequate to identify polyps 6 mm                            and larger in size. Scope In: 11:00:59 AM Scope Out: 11:20:27 AM Scope Withdrawal Time: 0 hours 12 minutes 6 seconds  Total Procedure Duration: 0 hours 19 minutes 28 seconds  Findings:      Hemorrhoids were found on perianal exam.      A 5 mm polyp was found in the hepatic flexure. The polyp was sessile.       The polyp was removed with a hot snare. Resection and retrieval were       complete.      A 5 mm polyp was found in the ascending colon. The polyp was sessile.       The polyp was removed with a hot snare. Resection and retrieval were  complete.      A 4 mm polyp was found in the descending colon. The polyp was sessile.       The polyp was removed with a hot snare. Resection and retrieval were       complete.      A few small and large-mouthed diverticula were found in the sigmoid       colon and descending colon.      The terminal ileum appeared normal.      Non-bleeding internal hemorrhoids were found during retroflexion. The       hemorrhoids were medium-sized. Impression:               - Hemorrhoids found on perianal exam.                           - One 5 mm polyp at the hepatic flexure, removed                            with a hot snare. Resected and retrieved.                           - One 5 mm polyp in the ascending colon,  removed                            with a hot snare. Resected and retrieved.                           - One 4 mm polyp in the descending colon, removed                            with a hot snare. Resected and retrieved.                           - Diverticulosis in the sigmoid colon and in the                            descending colon.                           - The examined portion of the ileum was normal.                           - Non-bleeding internal hemorrhoids. Moderate Sedation:      Patient did not receive moderate sedation for this procedure, but       instead received monitored anesthesia care. Recommendation:           - Patient has a contact number available for                            emergencies. The signs and symptoms of potential                            delayed complications were discussed with the  patient. Return to normal activities tomorrow.                            Written discharge instructions were provided to the                            patient.                           - Resume regular diet.                           - Continue present medications.                           - Await pathology results.                           - Repeat colonoscopy for surveillance based on                            pathology results. Procedure Code(s):        --- Professional ---                           [phone], Colonoscopy, flexible; with removal of                            tumor(s), polyp(s), or other lesion(s) by snare                            technique Diagnosis Code(s):        --- Professional ---                           Z12.11, Encounter for screening for malignant                            neoplasm of colon                           K64.8, Other hemorrhoids                           K63.5, Polyp of colon                           K57.30, Diverticulosis of large intestine without                            perforation  or abscess without bleeding CPT copyright 2019 [employer]. All rights reserved. The codes documented in this report are preliminary and upon coder review may  be revised to meet current compliance requirements. Ronnette Juniper, MD 09/28/2018 11:27:40 AM This report has been signed electronically. Number of Addenda: 0

## 2018-09-29 ENCOUNTER — Inpatient Hospital Stay: Payer: Medicare Other

## 2018-09-29 ENCOUNTER — Encounter (HOSPITAL_COMMUNITY): Payer: Self-pay | Admitting: Gastroenterology

## 2018-09-29 ENCOUNTER — Other Ambulatory Visit: Payer: Self-pay

## 2018-09-29 VITALS — BP 132/60 | HR 61 | Temp 99.1°F | Resp 18 | Wt 183.5 lb

## 2018-09-29 DIAGNOSIS — C482 Malignant neoplasm of peritoneum, unspecified: Secondary | ICD-10-CM

## 2018-09-29 DIAGNOSIS — Z7189 Other specified counseling: Secondary | ICD-10-CM

## 2018-09-29 DIAGNOSIS — C569 Malignant neoplasm of unspecified ovary: Secondary | ICD-10-CM

## 2018-09-29 LAB — CMP (CANCER CENTER ONLY)
ALT: 30 U/L (ref 0–44)
AST: 30 U/L (ref 15–41)
Albumin: 4 g/dL (ref 3.5–5.0)
Alkaline Phosphatase: 116 U/L (ref 38–126)
Anion gap: 11 (ref 5–15)
BUN: 15 mg/dL (ref 8–23)
CO2: 24 mmol/L (ref 22–32)
Calcium: 9 mg/dL (ref 8.9–10.3)
Chloride: 104 mmol/L (ref 98–111)
Creatinine: 1.03 mg/dL — ABNORMAL HIGH (ref 0.44–1.00)
GFR, Est AFR Am: >60 mL/min (ref >60)
GFR, Estimated: 55 mL/min — ABNORMAL LOW (ref >60)
Glucose, Bld: 172 mg/dL — ABNORMAL HIGH (ref 70–99)
Potassium: 4.2 mmol/L (ref 3.5–5.1)
Sodium: 139 mmol/L (ref 135–145)
Total Bilirubin: 0.9 mg/dL (ref 0.3–1.2)
Total Protein: 7.8 g/dL (ref 6.5–8.1)

## 2018-09-29 LAB — CBC WITH DIFFERENTIAL (CANCER CENTER ONLY)
Abs Immature Granulocytes: 0.03 10*3/uL (ref 0.00–0.07)
Basophils Absolute: 0 10*3/uL (ref 0.0–0.1)
Basophils Relative: 0 %
Eosinophils Absolute: 0.1 10*3/uL (ref 0.0–0.5)
Eosinophils Relative: 2 %
HCT: 40.5 % (ref 36.0–46.0)
Hemoglobin: 13.4 g/dL (ref 12.0–15.0)
Immature Granulocytes: 1 %
Lymphocytes Relative: 22 %
Lymphs Abs: 1.5 10*3/uL (ref 0.7–4.0)
MCH: 30.5 pg (ref 26.0–34.0)
MCHC: 33.1 g/dL (ref 30.0–36.0)
MCV: 92 fL (ref 80.0–100.0)
Monocytes Absolute: 0.4 10*3/uL (ref 0.1–1.0)
Monocytes Relative: 7 %
Neutro Abs: 4.5 10*3/uL (ref 1.7–7.7)
Neutrophils Relative %: 68 %
Platelet Count: 103 10*3/uL — ABNORMAL LOW (ref 150–400)
RBC: 4.4 MIL/uL (ref 3.87–5.11)
RDW: 13.7 % (ref 11.5–15.5)
WBC Count: 6.6 10*3/uL (ref 4.0–10.5)
nRBC: 0 % (ref 0.0–0.2)

## 2018-09-29 MED ORDER — PALONOSETRON HCL INJECTION 0.25 MG/5ML
0.2500 mg | Freq: Once | INTRAVENOUS | Status: AC
  Administered 2018-09-29: 0.25 mg via INTRAVENOUS

## 2018-09-29 MED ORDER — FAMOTIDINE IN NACL 20-0.9 MG/50ML-% IV SOLN
20.0000 mg | Freq: Once | INTRAVENOUS | Status: AC
  Administered 2018-09-29: 20 mg via INTRAVENOUS

## 2018-09-29 MED ORDER — HEPARIN SOD (PORK) LOCK FLUSH 100 UNIT/ML IV SOLN
500.0000 [IU] | Freq: Once | INTRAVENOUS | Status: AC | PRN
  Administered 2018-09-29: 500 [IU]
  Filled 2018-09-29: qty 5

## 2018-09-29 MED ORDER — SODIUM CHLORIDE 0.9 % IV SOLN
Freq: Once | INTRAVENOUS | Status: AC
  Administered 2018-09-29: 14:00:00 via INTRAVENOUS
  Filled 2018-09-29: qty 5

## 2018-09-29 MED ORDER — FAMOTIDINE IN NACL 20-0.9 MG/50ML-% IV SOLN
INTRAVENOUS | Status: AC
  Filled 2018-09-29: qty 50

## 2018-09-29 MED ORDER — SODIUM CHLORIDE 0.9% FLUSH
10.0000 mL | Freq: Once | INTRAVENOUS | Status: AC
  Administered 2018-09-29: 10 mL
  Filled 2018-09-29: qty 10

## 2018-09-29 MED ORDER — SODIUM CHLORIDE 0.9 % IV SOLN
Freq: Once | INTRAVENOUS | Status: AC
  Administered 2018-09-29: 13:00:00 via INTRAVENOUS
  Filled 2018-09-29: qty 250

## 2018-09-29 MED ORDER — SODIUM CHLORIDE 0.9% FLUSH
10.0000 mL | INTRAVENOUS | Status: DC | PRN
  Administered 2018-09-29: 10 mL
  Filled 2018-09-29: qty 10

## 2018-09-29 MED ORDER — PALONOSETRON HCL INJECTION 0.25 MG/5ML
INTRAVENOUS | Status: AC
  Filled 2018-09-29: qty 5

## 2018-09-29 MED ORDER — SODIUM CHLORIDE 0.9 % IV SOLN
800.0000 mg/m2 | Freq: Once | INTRAVENOUS | Status: AC
  Administered 2018-09-29: 1520 mg via INTRAVENOUS
  Filled 2018-09-29: qty 39.98

## 2018-09-29 MED ORDER — SODIUM CHLORIDE 0.9 % IV SOLN
366.0000 mg | Freq: Once | INTRAVENOUS | Status: AC
  Administered 2018-09-29: 370 mg via INTRAVENOUS
  Filled 2018-09-29: qty 37

## 2018-09-29 NOTE — Progress Notes (Signed)
Increased premeds (h/o Carbo test doses)--added Pepcid 20 mg IV. Did not add Benadryl since pt has h/o RLS w/ Benadryl.  Kennith Center, Pharm.D., CPP 09/29/2018@1 :38 PM

## 2018-09-29 NOTE — Patient Instructions (Signed)
[facility] Discharge Instructions for Patients Receiving Chemotherapy  Today you received the following chemotherapy agents Gemcitabine (GEMZAR) & Carboplatin (PARAPLATIN).  To help prevent nausea and vomiting after your treatment, we encourage you to take your nausea medication as prescribed.   If you develop nausea and vomiting that is not controlled by your nausea medication, call the clinic.   BELOW ARE SYMPTOMS THAT SHOULD BE REPORTED IMMEDIATELY:  *FEVER GREATER THAN 100.5 F  *CHILLS WITH OR WITHOUT FEVER  NAUSEA AND VOMITING THAT IS NOT CONTROLLED WITH YOUR NAUSEA MEDICATION  *UNUSUAL SHORTNESS OF BREATH  *UNUSUAL BRUISING OR BLEEDING  TENDERNESS IN MOUTH AND THROAT WITH OR WITHOUT PRESENCE OF ULCERS  *URINARY PROBLEMS  *BOWEL PROBLEMS  UNUSUAL RASH Items with * indicate a potential emergency and should be followed up as soon as possible.  Feel free to call the clinic should you have any questions or concerns. The clinic phone number is [phone].  Please show the [facility] at check-in to the Emergency Department and triage nurse.  Coronavirus (COVID-19) Are you at risk?  Are you at risk for the Coronavirus (COVID-19)?  To be considered HIGH RISK for Coronavirus (COVID-19), you have to meet the following criteria:  . Traveled to [place], [place], [place], [place] or [place]; or in the [place] to [place], [place], [place], or [place]; and have fever, cough, and shortness of breath within the last 2 weeks of travel OR . Been in close contact with a person diagnosed with COVID-19 within the last 2 weeks and have fever, cough, and shortness of breath . IF YOU DO NOT MEET THESE CRITERIA, YOU ARE CONSIDERED LOW RISK FOR COVID-19.  What to do if you are HIGH RISK for COVID-19?  Marland Kitchen If you are having a medical emergency, call 911. . Seek medical care right away. Before you go to a doctor's office, urgent care or emergency department,  call ahead and tell them about your recent travel, contact with someone diagnosed with COVID-19, and your symptoms. You should receive instructions from your physician's office regarding next steps of care.  . When you arrive at healthcare provider, tell the healthcare staff immediately you have returned from visiting [place], [place], [place], [place] or [place]; or traveled in the [place] to [place], [place], [place], or [place]; in the last two weeks or you have been in close contact with a person diagnosed with COVID-19 in the last 2 weeks.   . Tell the health care staff about your symptoms: fever, cough and shortness of breath. . After you have been seen by a medical provider, you will be either: o Tested for (COVID-19) and discharged home on quarantine except to seek medical care if symptoms worsen, and asked to  - Stay home and avoid contact with others until you get your results (4-5 days)  - Avoid travel on public transportation if possible (such as bus, train, or airplane) or o Sent to the Emergency Department by EMS for evaluation, COVID-19 testing, and possible admission depending on your condition and test results.  What to do if you are LOW RISK for COVID-19?  Reduce your risk of any infection by using the same precautions used for avoiding the common cold or flu:  Marland Kitchen Wash your hands often with soap and warm water for at least 20 seconds.  If soap and water are not readily available, use an alcohol-based hand sanitizer with at least 60% alcohol.  Marland Kitchen  If coughing or sneezing, cover your mouth and nose by coughing or sneezing into the elbow areas of your shirt or coat, into a tissue or into your sleeve (not your hands). . Avoid shaking hands with others and consider head nods or verbal greetings only. . Avoid touching your eyes, nose, or mouth with unwashed hands.  . Avoid close contact with people who are sick. . Avoid places or events with large numbers of people in one  location, like concerts or sporting events. . Carefully consider travel plans you have or are making. . If you are planning any travel outside or inside the [place], visit the CDC's Travelers' Health webpage for the latest health notices. . If you have some symptoms but not all symptoms, continue to monitor at home and seek medical attention if your symptoms worsen. . If you are having a medical emergency, call 911.   [place] / e-Visit: [email or web address]         [facility]: [facility]: [phone]                   [facility]: [phone]

## 2018-09-29 NOTE — Patient Instructions (Signed)

## 2018-09-30 LAB — CA 125: Cancer Antigen (CA) 125: 121 U/mL — ABNORMAL HIGH (ref 0.0–38.1)

## 2018-10-06 ENCOUNTER — Other Ambulatory Visit: Payer: Self-pay

## 2018-10-06 ENCOUNTER — Inpatient Hospital Stay (HOSPITAL_BASED_OUTPATIENT_CLINIC_OR_DEPARTMENT_OTHER): Payer: Medicare Other | Admitting: Hematology and Oncology

## 2018-10-06 ENCOUNTER — Other Ambulatory Visit: Payer: Self-pay | Admitting: Hematology and Oncology

## 2018-10-06 ENCOUNTER — Encounter: Payer: Self-pay | Admitting: Hematology and Oncology

## 2018-10-06 ENCOUNTER — Inpatient Hospital Stay: Payer: Medicare Other

## 2018-10-06 ENCOUNTER — Telehealth: Payer: Self-pay | Admitting: Hematology and Oncology

## 2018-10-06 DIAGNOSIS — C482 Malignant neoplasm of peritoneum, unspecified: Secondary | ICD-10-CM | POA: Diagnosis not present

## 2018-10-06 DIAGNOSIS — K7460 Unspecified cirrhosis of liver: Secondary | ICD-10-CM

## 2018-10-06 DIAGNOSIS — K7581 Nonalcoholic steatohepatitis (NASH): Secondary | ICD-10-CM | POA: Diagnosis not present

## 2018-10-06 DIAGNOSIS — C569 Malignant neoplasm of unspecified ovary: Secondary | ICD-10-CM

## 2018-10-06 DIAGNOSIS — D61818 Other pancytopenia: Secondary | ICD-10-CM

## 2018-10-06 DIAGNOSIS — Z7189 Other specified counseling: Secondary | ICD-10-CM

## 2018-10-06 DIAGNOSIS — G8929 Other chronic pain: Secondary | ICD-10-CM

## 2018-10-06 DIAGNOSIS — M545 Low back pain, unspecified: Secondary | ICD-10-CM

## 2018-10-06 DIAGNOSIS — D509 Iron deficiency anemia, unspecified: Secondary | ICD-10-CM

## 2018-10-06 LAB — CMP (CANCER CENTER ONLY)
ALT: 103 U/L — ABNORMAL HIGH (ref 0–44)
AST: 56 U/L — ABNORMAL HIGH (ref 15–41)
Albumin: 3.8 g/dL (ref 3.5–5.0)
Alkaline Phosphatase: 122 U/L (ref 38–126)
Anion gap: 10 (ref 5–15)
BUN: 18 mg/dL (ref 8–23)
CO2: 24 mmol/L (ref 22–32)
Calcium: 8.8 mg/dL — ABNORMAL LOW (ref 8.9–10.3)
Chloride: 106 mmol/L (ref 98–111)
Creatinine: 0.97 mg/dL (ref 0.44–1.00)
GFR, Est AFR Am: >60 mL/min (ref >60)
GFR, Estimated: 59 mL/min — ABNORMAL LOW (ref >60)
Glucose, Bld: 155 mg/dL — ABNORMAL HIGH (ref 70–99)
Potassium: 4.1 mmol/L (ref 3.5–5.1)
Sodium: 140 mmol/L (ref 135–145)
Total Bilirubin: 1 mg/dL (ref 0.3–1.2)
Total Protein: 7.4 g/dL (ref 6.5–8.1)

## 2018-10-06 LAB — FERRITIN: Ferritin: 346 ng/mL — ABNORMAL HIGH (ref 11–307)

## 2018-10-06 LAB — CBC WITH DIFFERENTIAL (CANCER CENTER ONLY)
Abs Immature Granulocytes: 0 10*3/uL (ref 0.00–0.07)
Basophils Absolute: 0 10*3/uL (ref 0.0–0.1)
Basophils Relative: 1 %
Eosinophils Absolute: 0 10*3/uL (ref 0.0–0.5)
Eosinophils Relative: 1 %
HCT: 35.2 % — ABNORMAL LOW (ref 36.0–46.0)
Hemoglobin: 11.8 g/dL — ABNORMAL LOW (ref 12.0–15.0)
Immature Granulocytes: 0 %
Lymphocytes Relative: 49 %
Lymphs Abs: 1 10*3/uL (ref 0.7–4.0)
MCH: 30.3 pg (ref 26.0–34.0)
MCHC: 33.5 g/dL (ref 30.0–36.0)
MCV: 90.5 fL (ref 80.0–100.0)
Monocytes Absolute: 0 10*3/uL — ABNORMAL LOW (ref 0.1–1.0)
Monocytes Relative: 2 %
Neutro Abs: 0.9 10*3/uL — ABNORMAL LOW (ref 1.7–7.7)
Neutrophils Relative %: 47 %
Platelet Count: 47 10*3/uL — ABNORMAL LOW (ref 150–400)
RBC: 3.89 MIL/uL (ref 3.87–5.11)
RDW: 13.2 % (ref 11.5–15.5)
WBC Count: 2 10*3/uL — ABNORMAL LOW (ref 4.0–10.5)
nRBC: 0 % (ref 0.0–0.2)

## 2018-10-06 LAB — IRON AND TIBC
Iron: 176 ug/dL — ABNORMAL HIGH (ref 41–142)
Saturation Ratios: 62 % — ABNORMAL HIGH (ref 21–57)
TIBC: 284 ug/dL (ref 236–444)
UIBC: 108 ug/dL — ABNORMAL LOW (ref 120–384)

## 2018-10-06 MED ORDER — SODIUM CHLORIDE 0.9% FLUSH
10.0000 mL | Freq: Once | INTRAVENOUS | Status: AC
  Administered 2018-10-06: 10 mL
  Filled 2018-10-06: qty 10

## 2018-10-06 MED ORDER — HYDROCODONE-ACETAMINOPHEN 5-325 MG PO TABS: 1.0000 | ORAL_TABLET | Freq: Four times a day (QID) | ORAL | 0 refills | Status: DC | PRN

## 2018-10-06 NOTE — Telephone Encounter (Signed)
I talk with patient regarding schedule for 9/1

## 2018-10-06 NOTE — Progress Notes (Signed)
[facility] OFFICE PROGRESS NOTE  Patient Care Team: [name], MD as PCP - General (Hematology and Oncology)  ASSESSMENT & PLAN:  Primary peritoneal carcinomatosis [facility]) She tolerated cycle 1 well except it is complicated by severe pancytopenia We will hold cycle 1 day 8 today and rescheduled to next week She is not symptomatic She does not need transfusion support or G-CSF I will see her back next month prior to cycle 2 of treatment If she continues to have persistent pancytopenia with each dose, I might have to modify each cycle 2-day 1 and day 15 I will proceed to modify the dose of gemcitabine next week  Pancytopenia, acquired ([place]) She has severe pancytopenia secondary to recent chemotherapy and also background history of liver disease I plan to reduce the dose of gemcitabine further and we will hold treatment today She does not need transfusion support  Liver cirrhosis secondary to NASH [facility]) She has elevated liver enzymes We will hold treatment and rescheduled to next week I plan dose reduction as above We discussed the importance of dietary modification and weight loss  Low back pain The patient have chronic joint pain and arthritis He is prescribed gabapentin along with hydrocodone as needed We will continue the same and I have refilled her prescriptions   No orders of the defined types were placed in this encounter.   INTERVAL HISTORY: Please see below for problem oriented charting. She returns for further follow-up She has mild retrosternal chest pain after recent gastric banding procedure which is not unexpected The patient denies any recent signs or symptoms of bleeding such as spontaneous epistaxis, hematuria or hematochezia. 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  No worrisome liver mass.  There are associated portal venous collaterals and splenomegaly consistent with portal venous hypertension. 2.  No other significant upper abdominal findings   11/08/2013 Imaging   US showed ascites   11/08/2013 Initial Diagnosis   Patient had ~ 2 months of some urinary incontinence, saw Dr Ronita Hipps in 10-2013, with pelvic mass on exam    11/12/2013 Imaging   There are findings of progressive hepatic cirrhosis with a large amount of ascites and mild splenomegaly. There are venous collaterals present. 2. There is abnormal thickening of the peritoneal surface along the left mid and lower abdominal wall. There is abnormal soft tissue density in the pelvis as well which could reflect the clinically suspected ovarian malignancy but the findings are nondiagnostic. 3. There is a new small left pleural effusion. 4. There is no acute bowel abnormality.   11/18/2013 Imaging   Successful ultrasound guided diagnostic and therapeutic paracentesis yielding 4.1 liters of ascites.    11/18/2013 Pathology Results   PERITONEAL/ASCITIC FLUID (SPECIMEN 1 OF 1 COLLECTED 11/18/13): SEROUS CARCINOMA, PLEASE SEE COMMENT.   12/01/2013 Tumor Marker   Patient's tumor was tested for the following markers: CA125 Results of the tumor marker test revealed 1938   12/03/2013 Imaging   Successful ultrasound-guided therapeutic paracentesis yielding 3.6 liters of peritoneal fluid.   12/07/2013 - 05/30/2014 Chemotherapy   She received 3 cycles of carboplatin and Taxol, interrupted cycle 4 for surgery.  She subsequently completed 3 more cycles of chemotherapy after surgery   12/20/2013 Imaging   Successful ultrasound-guided diagnostic and therapeutic paracentesis yielding 2.2 liters of peritoneal  fluid   12/20/2013 Pathology Results   PERITONEAL/ASCITIC FLUID(SPECIMEN 1 OF 1 COLLECTED 12/20/13): MALIGNANT CELLS CONSISTENT WITH SEROUS  CARCINOMA   01/13/2014 Tumor Marker   Patient's tumor was tested for the following markers: CA125 Results of the tumor marker test revealed 227   02/07/2014 Tumor Marker   Patient's tumor was tested for the following markers: CA125 Results of the tumor marker test revealed 130   02/15/2014 Genetic Testing   Genetics testing from 02-2014 normal ([employer] panel)   02/23/2014 Imaging   Interval decrease in ascites. 2. Morphologic changes in the liver consistent with cirrhosis. 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 Portal vein remains patent at this time. 3. Persistent but decreased abnormal soft tissue attenuation tracking in the omental fat. This may be secondary to interval improvement in metastatic disease. 4. Peritoneal thickening seen in the left abdomen and pelvis on the previous study has decreased and nearly resolved in the interval. This also suggests interval improvement in metastatic disease.    03/07/2014 Procedure   Ultrasound and fluoroscopically guided right internal jugular single lumen power port catheter insertion. Tip in the SVC/RA junction. Catheter ready for use.   03/17/2014 Tumor Marker   Patient's tumor was tested for the following markers: CA125 Results of the tumor marker test revealed 45   03/29/2014 Pathology Results   1. Omentum, resection for tumor - HIGH GRADE SEROUS CARCINOMA, SEE COMMENT. 2. Ovary and fallopian tube, right - HIGH GRADE SEROUS CARCINOMA, SEE COMMENT. 3. Ovary and fallopian tube, left - HIGH GRADE SEROUS CARCINOMA, SEE COMMENT. Diagnosis Note 1. Nests and clusters of malignant cells are invading the omental tissue (part #1) with associated fibrosis. The cells are pleomorphic with prominent nucleoli. There are scattered psammoma bodies. The ovaries are atrophic and exhibit multiple foci of surface based invasive carcinoma and associated fibrosis. The fallopian tubes have a few foci of carcinoma, also  superficially located. There are no precursor lesions noted in the ovaries or fallopian tubes (the entire tubes and ovaries were submitted for evaluation). While there is some retraction artifact, there are several foci suspicious for lymphovascular invasion. 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 Given the fibrosis, there does appear to be a small amount of treatment effect, however, there is abundant residual tumor.    03/29/2014 Surgery   Procedure(s) Performed: 1. Exploratory laparotomy with bilateral salpingo-oophorectomy, omentectomy radical tumor debulking for ovarian cancer .  Surgeon: Thereasa Solo, MD.  Assistant Surgeon: Lahoma Crocker, M.D. Assistant: (an MD assistant was necessary for tissue manipulation, retraction and positioning due to the complexity of the case and hospital policies).  Operative Findings: 10cm omental cake from hepatic to splenic flexure, densely adherent to transverse colon. Milliary studding of tumor implants (<24m, too numerous in number to count) adherent to the mesentery of the small bowel and small bowel wall. Small volume (100cc) ascites. Small ovaries bilaterally, densely adherent to the pelvic cul de sac. Left ovary and tube densely adherent to sigmoid colon. Cirrhotic liver with hepatomegaly and splenomegaly.     This represented an optimal cytoreduction (R1) with visible disease residual on the bowel wall and mesentery (millial, <385mimplants).     04/18/2014 Tumor Marker   Patient's tumor was tested for the following markers: CA125 Results of the tumor marker test revealed 122   05/16/2014 Tumor Marker   Patient's tumor was tested  for the following markers: CA125 Results of the tumor marker test revealed 30   06/10/2014 Tumor Marker   Patient's tumor was tested for the following markers: CA125 Results of the  tumor marker test revealed 19   07/04/2014 Imaging   Interval improvement in the appearance of peritoneal metastasis secondary to ovarian cancer. 2. 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    08/18/2014 Tumor Marker   Patient's tumor was tested for the following markers: CA125 Results of the tumor marker test revealed 16   10/13/2014 Tumor Marker   Patient's tumor was tested for the following markers: CA125 Results of the tumor marker test revealed 14   11/21/2014 Tumor Marker   Patient's tumor was tested for the following markers: CA125 Results of the tumor marker test revealed 16   12/28/2014 Tumor Marker   Patient's tumor was tested for the following markers: CA125 Results of the tumor marker test revealed 762   02/27/2015 Tumor Marker   Patient's tumor was tested for the following markers: CA125 Results of the tumor marker test revealed 20.2   03/22/2015 Imaging   Filler appearance to the prior exam, with very subtle fluid tracking along portions of the liver, and very minimal nodularity along the right paracolic gutter representing residua from the prior peritoneal cancer. The current abnormalities could simply be post therapy findings rather than necessarily representing residual malignancy. No new or enlarging lesions are identified. 2. Hepatic cirrhosis and splenomegaly. There is some gastric varices suggesting portal venous hypertension. 3. Left foraminal stenosis at L4-5 due to spurring. There is likely also some central narrowing of the thecal sac at this level. 4. Bibasilar scarring. 5. Chronic mild left mid kidney scarring   03/27/2015 Tumor Marker   Patient's tumor was tested for the following markers: CA125 Results of the tumor marker test revealed 25.3   04/24/2015 Imaging   Disc bulge L2-3 with mild spinal stenosis and right foraminal encroachment. 2. Disc bulge L3-4 with mild spinal stenosis. 3. Multifactorial spinal, left lateral recess and foraminal  stenosis L4-5. There is grade 1 anterolisthesis without evident dynamic instability.   05/29/2015 Tumor Marker   Patient's tumor was tested for the following markers: CA125 Results of the tumor marker test revealed 29.9   08/21/2015 Tumor Marker   Patient's tumor was tested for the following markers: CA125 Results of the tumor marker test revealed 45   09/18/2015 Tumor Marker   Patient's tumor was tested for the following markers: CA125 Results of the tumor marker test revealed 47   09/27/2015 Imaging   New small bowel mesenteric nodule and enlarged left external iliac lymph node, highly worrisome for metastatic disease. 2. Cirrhosis and splenomegaly   10/10/2015 Tumor Marker   Patient's tumor was tested for the following markers: CA125 Results of the tumor marker test revealed 53.4   10/10/2015 - 12/11/2015 Chemotherapy   She received 4 cycles of carboplatin only   11/20/2015 Tumor Marker   Patient's tumor was tested for the following markers: CA125 Results of the tumor marker test revealed 41.3   12/20/2015 Imaging   CT: Mild left external iliac lymphadenopathy is slightly decreased. Mildly enlarged lower mesenteric nodule is slightly decreased. 2. No new or progressive metastatic disease in the abdomen or pelvis. 3. Cirrhosis. Stable subcentimeter hypodense left liver lobe lesion. No new liver lesions. 4. Stable mild splenomegaly.  No ascites. 5. Mild sigmoid diverticulosis.   01/01/2016 -  Anti-estrogen oral therapy   She was placed on tamoxifen. Had  letrozole initially but switched to tamoxifen due to poor tolerance   01/15/2016 Tumor Marker   Patient's tumor was tested for the following markers: CA125 Results of the tumor marker test revealed 31.4   02/19/2016 Tumor Marker   Patient's tumor was tested for the following markers: CA125 Results of the tumor marker test revealed 36.5   05/13/2016 Imaging   No evidence of disease progression within the abdomen or pelvis. Previously  noted small central mesenteric nodule and left external iliac node appear slightly smaller. 2. Stable changes of hepatic cirrhosis and portal hypertension with associated splenomegaly. No new or enlarging hepatic lesions are identified. 3. No acute findings.   05/13/2016 Tumor Marker   Patient's tumor was tested for the following markers: CA125 Results of the tumor marker test revealed 41.2   06/24/2016 Tumor Marker   Patient's tumor was tested for the following markers: CA125 Results of the tumor marker test revealed 47.3   08/20/2016 Imaging   1. The left external iliac lymph node is slightly increased in size compared to the prior exam, currently 1.1 cm and previously 0.9 cm in diameter. 2. Stable central mesenteric lymph node at 0.9 cm in diameter. 3. Stable trace free pelvic fluid and stable slight thickening along the right paracolic gutter, without well-defined peritoneal nodularity. 4. Other imaging findings of potential clinical significance: Subsegmental atelectasis or scarring in the lung bases. Hepatic cirrhosis. Left renal scarring. Mild splenomegaly. Sigmoid colon diverticulosis. Impingement at L4-5 due to spondylosis and degenerative disc disease broad Schmorl' s nodes at L3-L4. Pelvic floor laxity   08/23/2016 Tumor Marker   Patient's tumor was tested for the following markers: CA125 Results of the tumor marker test revealed 80.2   09/04/2016 Imaging   LV EF: 60% -   65%   09/12/2016 Tumor Marker   Patient's tumor was tested for the following markers: CA125 Results of the tumor marker test revealed 98.5   09/12/2016 - 01/29/2018 Chemotherapy   The patient received Doxil and Avastin. Avastin was discontinued in Dec 2019 due to GI hemorrhage   09/26/2016 Tumor Marker   Patient's tumor was tested for the following markers: CA125 Results of the tumor marker test revealed 68.9   10/10/2016 Tumor Marker   Patient's tumor was tested for the following markers: CA125 Results of the  tumor marker test revealed 65.6   11/07/2016 Tumor Marker   Patient's tumor was tested for the following markers: CA125 Results of the tumor marker test revealed 46.4   11/29/2016 Imaging   Stable mild peritoneal thickening, suspicious for peritoneal carcinomatosis. No new or progressive disease identified. No evidence of ascites.  No significant change in 10 mm left external iliac and 8 mm mesenteric lymph nodes.  Stable hepatic cirrhosis, and splenomegaly consistent with portal venous hypertension.  Colonic diverticulosis, without radiographic evidence of diverticulitis.   12/02/2016 Imaging   Normal LV size with EF 55%. Basal inferior and basal inferoseptal hypokinesis. Normal RV size and systolic function. No significant valvular abnormalities.   12/05/2016 Tumor Marker   Patient's tumor was tested for the following markers: CA125 Results of the tumor marker test revealed 49.1   01/09/2017 Tumor Marker   Patient's tumor was tested for the following markers: CA125 Results of the tumor marker test revealed 47.2   02/06/2017 Tumor Marker   Patient's tumor was tested for the following markers: CA125 Results of the tumor marker test revealed 44.1   02/18/2017 Imaging   Stable mild peritoneal thickening. No new or progressive disease  identified within the abdomen or pelvis.  Stable tiny sub-cm left external iliac and mesenteric lymph nodes.  Stable hepatic cirrhosis, and splenomegaly consistent with portal venous hypertension. No evidence of hepatic neoplasm.  Colonic diverticulosis, without radiographic evidence of diverticulitis.   03/06/2017 Tumor Marker   Patient's tumor was tested for the following markers: CA125 Results of the tumor marker test revealed 50.4   03/17/2017 Imaging   - Left ventricle: The cavity size was normal. Wall thickness was normal. Systolic function was normal. The estimated ejection fraction was in the range of 55% to 60%. Wall motion was normal; there  were no regional wall motion abnormalities. Features are consistent with a pseudonormal left ventricular filling pattern, with concomitant abnormal relaxation and increased filling pressure (grade 2 diastolic dysfunction). - Mitral valve: There was mild regurgitation. - Left atrium: The atrium was mildly dilated   04/03/2017 Tumor Marker   Patient's tumor was tested for the following markers: CA125 Results of the tumor marker test revealed 47.2   05/14/2017 Imaging   CT scan of abdomen and pelvis Stable mild peritoneal thickening. No new or progressive disease within the abdomen or pelvis.  Stable hepatic cirrhosis. Stable splenomegaly, consistent with portal venous hypertension. No evidence of hepatic neoplasm.   05/15/2017 Tumor Marker   Patient's tumor was tested for the following markers: CA125 Results of the tumor marker test revealed 56.1   06/12/2017 Tumor Marker   Patient's tumor was tested for the following markers: CA125 Results of the tumor marker test revealed 49.2   07/10/2017 Tumor Marker   Patient's tumor was tested for the following markers: CA125 Results of the tumor marker test revealed 46.3   08/07/2017 Tumor Marker   Patient's tumor was tested for the following markers: CA125 Results of the tumor marker test revealed 52.9   08/20/2017 Imaging   1. Mild omental/peritoneal haziness, unchanged. No evidence of new metastatic disease. 2. Cirrhosis with splenomegaly.   09/04/2017 Tumor Marker   Patient's tumor was tested for the following markers: CA125 Results of the tumor marker test revealed 48.5   11/13/2017 Tumor Marker   Patient's tumor was tested for the following markers: CA125 Results of the tumor marker test revealed 55.8   12/17/2017 Imaging   1. No findings to suggest metastatic disease in the abdomen or pelvis. 2. Severe hepatic cirrhosis with evidence of portal hypertension, as demonstrated by dilated portal vein, splenomegaly and portosystemic collateral  pathways, including gastric and esophageal varices.  3. Colonic diverticulosis without evidence of acute diverticulitis at this time. 4. Additional incidental findings, as above.   01/16/2018 Tumor Marker   Patient's tumor was tested for the following markers: CA125 Results of the tumor marker test revealed 63.7   02/10/2018 Procedure   EGD - Recently bleeding grade III and large (> 5 mm) esophageal varices. Completely eradicated. Banded. - Portal hypertensive gastropathy. - Normal examined duodenum. - No specimens collected   02/13/2018 Tumor Marker   Patient's tumor was tested for the following markers: CA125 Results of the tumor marker test revealed 82.7   02/25/2018 Imaging   Bone density showed mild osteopenia   04/08/2018 Tumor Marker   Patient's tumor was tested for the following markers: CA125 Results of the tumor marker test revealed 82.7   04/08/2018 Imaging   1. No definite omental or peritoneal surface lesions. However, there are 2 slowly enlarging lymph nodes noted. One is in the small bowel mesentery and the other is in the left deep pelvis. Could not exclude  recurrent disease. 2. Stable advanced cirrhotic changes involving the liver with portal venous hypertension, portal venous collaterals, esophageal varices and splenomegaly. No worrisome hepatic lesions. 3. Diffuse wall thickening of the colon could suggest diffuse colitis or could be due to low albumin. Recommend correlation with any symptoms such as diarrhea.   05/21/2018 Tumor Marker   Patient's tumor was tested for the following markers: CA125 Results of the tumor marker test revealed 86   09/07/2018 Tumor Marker   Patient's tumor was tested for the following markers:CA-125 Results of the tumor marker test revealed 111   09/29/2018 -  Chemotherapy   The patient had palonosetron (ALOXI) injection 0.25 mg, 0.25 mg, Intravenous,  Once, 1 of 3 cycles Administration: 0.25 mg (09/29/2018) CARBOplatin (PARAPLATIN) 370  mg in sodium chloride 0.9 % 250 mL chemo infusion, 370 mg (96.8 % of original dose 378 mg), Intravenous,  Once, 1 of 3 cycles Dose modification:   (original dose 378 mg, Cycle 1) Administration: 370 mg (09/29/2018) gemcitabine (GEMZAR) 1,520 mg in sodium chloride 0.9 % 250 mL chemo infusion, 800 mg/m2 = 1,520 mg, Intravenous,  Once, 1 of 3 cycles Dose modification: 600 mg/m2 (75 % of original dose 800 mg/m2, Cycle 1, Reason: Dose Not Tolerated) Administration: 1,520 mg (09/29/2018) fosaprepitant (EMEND) 150 mg, dexamethasone (DECADRON) 12 mg in sodium chloride 0.9 % 145 mL IVPB, , Intravenous,  Once, 1 of 3 cycles Administration:  (09/29/2018)  for chemotherapy treatment.    09/29/2018 Tumor Marker   Patient's tumor was tested for the following markers: CA-125 Results of the tumor marker test revealed 121.     REVIEW OF SYSTEMS:   Constitutional: Denies fevers, chills or abnormal weight loss Eyes: Denies blurriness of vision Ears, nose, mouth, throat, and face: Denies mucositis or sore throat Respiratory: Denies cough, dyspnea or wheezes Cardiovascular: Denies palpitation, chest discomfort or lower extremity swelling Gastrointestinal:  Denies nausea, heartburn or change in bowel habits Skin: Denies abnormal skin rashes Lymphatics: Denies new lymphadenopathy or easy bruising Neurological:Denies numbness, tingling or new weaknesses Behavioral/Psych: Mood is stable, no new changes  All other systems were reviewed with the patient and are negative.  I have reviewed the past medical history, past surgical history, social history and family history with the patient and they are unchanged from previous note.  ALLERGIES:  is allergic to shellfish allergy; benadryl [diphenhydramine hcl]; penicillins; and tape.  MEDICATIONS:  Current Outpatient Medications  Medication Sig Dispense Refill  . cholecalciferol (VITAMIN D) 1000 units tablet Take 1,000 Units by mouth at bedtime.     . gabapentin  (NEURONTIN) 300 MG capsule TAKE 2 CAPSULES(600 MG) BY MOUTH TWICE DAILY (Patient taking differently: Take 600 mg by mouth 2 (two) times daily. ) 120 capsule 11  . hydrochlorothiazide (MICROZIDE) 12.5 MG capsule Take 12.5 mg by mouth daily after breakfast.   1  . HYDROcodone-acetaminophen (NORCO/VICODIN) 5-325 MG tablet Take 1 tablet by mouth every 6 (six) hours as needed for moderate pain. 30 tablet 0  . insulin degludec (TRESIBA FLEXTOUCH) 100 UNIT/ML SOPN FlexTouch Pen Inject 18 Units into the skin daily after breakfast.     . lidocaine-prilocaine (EMLA) cream Apply to Porta-cath  1-2 hours prior to access as directed. (Patient taking differently: Apply 1 application topically daily as needed (port access). ) 30 g 9  . nadolol (CORGARD) 20 MG tablet Take 1 tablet (20 mg total) by mouth daily. (Patient taking differently: Take 20 mg by mouth at bedtime. ) 90 tablet 3  . ondansetron (ZOFRAN)  8 MG tablet Take 1 tablet (8 mg total) by mouth every 8 (eight) hours as needed. Start on the third day after chemotherapy. 30 tablet 1  . pantoprazole (PROTONIX) 40 MG tablet Take 1 tablet (40 mg total) by mouth daily.    . prochlorperazine (COMPAZINE) 10 MG tablet Take 1 tablet (10 mg total) by mouth every 6 (six) hours as needed (Nausea or vomiting). 30 tablet 1  . rotigotine (NEUPRO) 4 MG/24HR APPLY 1 PATCH EXTERNALLY TO THE SKIN DAILY (Patient taking differently: Place 1 patch onto the skin daily. ) 30 patch 5  . sucralfate (CARAFATE) 1 GM/10ML suspension Take 10 mLs (1 g total) by mouth 4 (four) times daily for 14 days. 560 mL 0   No current facility-administered medications for this visit.     PHYSICAL EXAMINATION: ECOG PERFORMANCE STATUS: 1 - Symptomatic but completely ambulatory  Vitals:   10/06/18 1019  BP: (!) 142/52  Pulse: 63  Resp: 18  Temp: 98 F (36.7 C)  SpO2: 100%   Filed Weights   10/06/18 1019  Weight: 182 lb 6.4 oz (82.7 kg)    GENERAL:alert, no distress and  comfortable Musculoskeletal:no cyanosis of digits and no clubbing  NEURO: alert & oriented x 3 with fluent speech, no focal motor/sensory deficits  LABORATORY DATA:  I have reviewed the data as listed    Component Value Date/Time   NA 140 10/06/2018 0906   NA 142 02/06/2017 0742   K 4.1 10/06/2018 0906   K 3.9 02/06/2017 0742   CL 106 10/06/2018 0906   CO2 24 10/06/2018 0906   CO2 22 02/06/2017 0742   GLUCOSE 155 (H) 10/06/2018 0906   GLUCOSE 156 (H) 02/06/2017 0742   BUN 18 10/06/2018 0906   BUN 13.5 02/06/2017 0742   CREATININE 0.97 10/06/2018 0906   CREATININE 0.8 02/06/2017 0742   CALCIUM 8.8 (L) 10/06/2018 0906   CALCIUM 8.5 02/06/2017 0742   PROT 7.4 10/06/2018 0906   PROT 6.9 02/06/2017 0742   ALBUMIN 3.8 10/06/2018 0906   ALBUMIN 3.7 02/06/2017 0742   AST 56 (H) 10/06/2018 0906   AST 47 (H) 02/06/2017 0742   ALT 103 (H) 10/06/2018 0906   ALT 54 02/06/2017 0742   ALKPHOS 122 10/06/2018 0906   ALKPHOS 130 02/06/2017 0742   BILITOT 1.0 10/06/2018 0906   BILITOT 0.65 02/06/2017 0742   GFRNONAA 59 (L) 10/06/2018 0906   GFRAA >60 10/06/2018 0906    No results found for: SPEP, UPEP  Lab Results  Component Value Date   WBC 2.0 (L) 10/06/2018   NEUTROABS 0.9 (L) 10/06/2018   HGB 11.8 (L) 10/06/2018   HCT 35.2 (L) 10/06/2018   MCV 90.5 10/06/2018   PLT 47 (L) 10/06/2018      Chemistry      Component Value Date/Time   NA 140 10/06/2018 0906   NA 142 02/06/2017 0742   K 4.1 10/06/2018 0906   K 3.9 02/06/2017 0742   CL 106 10/06/2018 0906   CO2 24 10/06/2018 0906   CO2 22 02/06/2017 0742   BUN 18 10/06/2018 0906   BUN 13.5 02/06/2017 0742   CREATININE 0.97 10/06/2018 0906   CREATININE 0.8 02/06/2017 0742      Component Value Date/Time   CALCIUM 8.8 (L) 10/06/2018 0906   CALCIUM 8.5 02/06/2017 0742   ALKPHOS 122 10/06/2018 0906   ALKPHOS 130 02/06/2017 0742   AST 56 (H) 10/06/2018 0906   AST 47 (H) 02/06/2017 0742   ALT 103 (  H) 10/06/2018 0906   ALT  54 02/06/2017 0742   BILITOT 1.0 10/06/2018 0906   BILITOT 0.65 02/06/2017 0742       RADIOGRAPHIC STUDIES: I have personally reviewed the radiological images as listed and agreed with the findings in the report. Ct Chest W Contrast  Result Date: 09/15/2018 CLINICAL DATA:  Peritoneal carcinoma diagnosed in 2015. Chemotherapy completed. No current complaints. EXAM: CT CHEST, ABDOMEN, AND PELVIS WITH CONTRAST TECHNIQUE: Multidetector CT imaging of the chest, abdomen and pelvis was performed following the standard protocol during bolus administration of intravenous contrast. CONTRAST:  111m OMNIPAQUE IOHEXOL 300 MG/ML  SOLN COMPARISON:  CT 04/08/2018, 12/17/2017 and 08/20/2016. FINDINGS: CT CHEST FINDINGS Cardiovascular: Minimal atherosclerosis of the aortic arch. Right IJ Port-A-Cath extends to the superior cavoatrial junction. The heart size is normal. There is no pericardial effusion. Mediastinum/Nodes: There are no enlarged mediastinal, hilar or axillary lymph nodes. The thyroid gland, trachea and esophagus demonstrate no significant findings. Lungs/Pleura: There is no pleural effusion or pneumothorax. Chronic linear scarring at both lung bases is similar to previous studies. No suspicious pulmonary nodules. Musculoskeletal/Chest wall: No chest wall mass or suspicious osseous findings. Bilateral breast implants are in place. CT ABDOMEN AND PELVIS FINDINGS Hepatobiliary: There are stable morphologic changes of cirrhosis within the liver. No focal lesion or abnormal enhancement identified. No evidence of gallstones, gallbladder wall thickening or biliary dilatation. Pancreas: Unremarkable. No pancreatic ductal dilatation or surrounding inflammatory changes. Spleen: Stable mild splenomegaly. No focal abnormality. Probable small splenule at the splenic hilum. Adrenals/Urinary Tract: Both adrenal glands appear normal. Stable cortical scarring in the upper pole of the left kidney. There is no evidence of renal  mass, urinary tract calculus or hydronephrosis. The bladder appears normal. Stomach/Bowel: No evidence of bowel wall thickening, distention or surrounding inflammatory change. Sigmoid diverticular changes are again noted. Vascular/Lymphatic: There is progressive adenopathy and peritoneal nodularity. 2.1 x 1.1 cm node in the gastrohepatic ligament on image 54/2 has slowly grown compared with prior studies. There is an 11 mm soft tissue nodule posterior to the lower pole of the right kidney on image 80/2. Central mesenteric node measuring 10 mm on image 86/2 has slowly grown from previous studies. The left pelvic soft tissue nodule measures 2.7 x 2.3 cm on image 104/2, most recently 1.9 x 1.6 cm. 12 mm left external iliac node on image 103/2 has not significantly changed. Mild aortic and branch vessel atherosclerosis and splenic vein collaterals are again noted. No acute vascular findings. Reproductive: Hysterectomy. As above, enlarging soft tissue nodule adjacent to the vaginal cuff on the left. Other: As above, increasing peritoneal nodularity and lymphadenopathy. No ascites or generalized omental caking. Musculoskeletal: No acute or significant osseous findings. Stable lumbar spondylosis with Schmorl's node formation in the superior endplate of L3. IMPRESSION: 1. Progressive enlargement of abdominopelvic lymph nodes and peritoneal nodules, largest along the vaginal cuff on the left. Findings are concerning for progressive metastatic disease. 2. No evidence of thoracic metastatic disease or solid organ involvement in the abdomen. 3. Stable morphologic changes of cirrhosis.  No acute findings. Electronically Signed   By: WRichardean SaleM.D.   On: 09/15/2018 12:48   Ct Abdomen Pelvis W Contrast  Result Date: 09/15/2018 CLINICAL DATA:  Peritoneal carcinoma diagnosed in 2015. Chemotherapy completed. No current complaints. EXAM: CT CHEST, ABDOMEN, AND PELVIS WITH CONTRAST TECHNIQUE: Multidetector CT imaging of the  chest, abdomen and pelvis was performed following the standard protocol during bolus administration of intravenous contrast. CONTRAST:  10108mOMNIPAQUE  IOHEXOL 300 MG/ML  SOLN COMPARISON:  CT 04/08/2018, 12/17/2017 and 08/20/2016. FINDINGS: CT CHEST FINDINGS Cardiovascular: Minimal atherosclerosis of the aortic arch. Right IJ Port-A-Cath extends to the superior cavoatrial junction. The heart size is normal. There is no pericardial effusion. Mediastinum/Nodes: There are no enlarged mediastinal, hilar or axillary lymph nodes. The thyroid gland, trachea and esophagus demonstrate no significant findings. Lungs/Pleura: There is no pleural effusion or pneumothorax. Chronic linear scarring at both lung bases is similar to previous studies. No suspicious pulmonary nodules. Musculoskeletal/Chest wall: No chest wall mass or suspicious osseous findings. Bilateral breast implants are in place. CT ABDOMEN AND PELVIS FINDINGS Hepatobiliary: There are stable morphologic changes of cirrhosis within the liver. No focal lesion or abnormal enhancement identified. No evidence of gallstones, gallbladder wall thickening or biliary dilatation. Pancreas: Unremarkable. No pancreatic ductal dilatation or surrounding inflammatory changes. Spleen: Stable mild splenomegaly. No focal abnormality. Probable small splenule at the splenic hilum. Adrenals/Urinary Tract: Both adrenal glands appear normal. Stable cortical scarring in the upper pole of the left kidney. There is no evidence of renal mass, urinary tract calculus or hydronephrosis. The bladder appears normal. Stomach/Bowel: No evidence of bowel wall thickening, distention or surrounding inflammatory change. Sigmoid diverticular changes are again noted. Vascular/Lymphatic: There is progressive adenopathy and peritoneal nodularity. 2.1 x 1.1 cm node in the gastrohepatic ligament on image 54/2 has slowly grown compared with prior studies. There is an 11 mm soft tissue nodule posterior to the  lower pole of the right kidney on image 80/2. Central mesenteric node measuring 10 mm on image 86/2 has slowly grown from previous studies. The left pelvic soft tissue nodule measures 2.7 x 2.3 cm on image 104/2, most recently 1.9 x 1.6 cm. 12 mm left external iliac node on image 103/2 has not significantly changed. Mild aortic and branch vessel atherosclerosis and splenic vein collaterals are again noted. No acute vascular findings. Reproductive: Hysterectomy. As above, enlarging soft tissue nodule adjacent to the vaginal cuff on the left. Other: As above, increasing peritoneal nodularity and lymphadenopathy. No ascites or generalized omental caking. Musculoskeletal: No acute or significant osseous findings. Stable lumbar spondylosis with Schmorl's node formation in the superior endplate of L3. IMPRESSION: 1. Progressive enlargement of abdominopelvic lymph nodes and peritoneal nodules, largest along the vaginal cuff on the left. Findings are concerning for progressive metastatic disease. 2. No evidence of thoracic metastatic disease or solid organ involvement in the abdomen. 3. Stable morphologic changes of cirrhosis.  No acute findings. Electronically Signed   By: Richardean Sale M.D.   On: 09/15/2018 12:48    All questions were answered. The patient knows to call the clinic with any problems, questions or concerns. No barriers to learning was detected.  I spent 25 minutes counseling the patient face to face. The total time spent in the appointment was 30 minutes and more than 50% was on counseling and review of test results  [name], MD 10/06/2018 1:31 PM

## 2018-10-06 NOTE — Assessment & Plan Note (Signed)
She has elevated liver enzymes We will hold treatment and rescheduled to next week I plan dose reduction as above We discussed the importance of dietary modification and weight loss

## 2018-10-06 NOTE — Assessment & Plan Note (Signed)
The patient have chronic joint pain and arthritis He is prescribed gabapentin along with hydrocodone as needed We will continue the same and I have refilled her prescriptions

## 2018-10-06 NOTE — Assessment & Plan Note (Signed)
She has severe pancytopenia secondary to recent chemotherapy and also background history of liver disease I plan to reduce the dose of gemcitabine further and we will hold treatment today She does not need transfusion support

## 2018-10-06 NOTE — Assessment & Plan Note (Signed)
She tolerated cycle 1 well except it is complicated by severe pancytopenia We will hold cycle 1 day 8 today and rescheduled to next week She is not symptomatic She does not need transfusion support or G-CSF I will see her back next month prior to cycle 2 of treatment If she continues to have persistent pancytopenia with each dose, I might have to modify each cycle 2-day 1 and day 15 I will proceed to modify the dose of gemcitabine next week

## 2018-10-13 ENCOUNTER — Inpatient Hospital Stay: Payer: Medicare Other

## 2018-10-13 ENCOUNTER — Inpatient Hospital Stay: Payer: Medicare Other | Attending: Hematology and Oncology

## 2018-10-13 ENCOUNTER — Other Ambulatory Visit: Payer: Self-pay

## 2018-10-13 ENCOUNTER — Other Ambulatory Visit: Payer: Self-pay | Admitting: Hematology and Oncology

## 2018-10-13 VITALS — BP 151/67 | HR 66 | Temp 98.5°F | Resp 20

## 2018-10-13 DIAGNOSIS — C482 Malignant neoplasm of peritoneum, unspecified: Secondary | ICD-10-CM | POA: Insufficient documentation

## 2018-10-13 DIAGNOSIS — Z23 Encounter for immunization: Secondary | ICD-10-CM | POA: Insufficient documentation

## 2018-10-13 DIAGNOSIS — Z79899 Other long term (current) drug therapy: Secondary | ICD-10-CM | POA: Diagnosis not present

## 2018-10-13 DIAGNOSIS — Z5111 Encounter for antineoplastic chemotherapy: Secondary | ICD-10-CM | POA: Diagnosis present

## 2018-10-13 DIAGNOSIS — Z9221 Personal history of antineoplastic chemotherapy: Secondary | ICD-10-CM | POA: Insufficient documentation

## 2018-10-13 DIAGNOSIS — Z794 Long term (current) use of insulin: Secondary | ICD-10-CM | POA: Diagnosis not present

## 2018-10-13 DIAGNOSIS — D61818 Other pancytopenia: Secondary | ICD-10-CM | POA: Diagnosis not present

## 2018-10-13 DIAGNOSIS — R748 Abnormal levels of other serum enzymes: Secondary | ICD-10-CM | POA: Insufficient documentation

## 2018-10-13 DIAGNOSIS — K7581 Nonalcoholic steatohepatitis (NASH): Secondary | ICD-10-CM | POA: Diagnosis not present

## 2018-10-13 DIAGNOSIS — C569 Malignant neoplasm of unspecified ovary: Secondary | ICD-10-CM

## 2018-10-13 DIAGNOSIS — R161 Splenomegaly, not elsewhere classified: Secondary | ICD-10-CM | POA: Insufficient documentation

## 2018-10-13 DIAGNOSIS — K7460 Unspecified cirrhosis of liver: Secondary | ICD-10-CM | POA: Diagnosis not present

## 2018-10-13 DIAGNOSIS — Z7189 Other specified counseling: Secondary | ICD-10-CM

## 2018-10-13 LAB — CMP (CANCER CENTER ONLY)
ALT: 81 U/L — ABNORMAL HIGH (ref 0–44)
AST: 52 U/L — ABNORMAL HIGH (ref 15–41)
Albumin: 3.8 g/dL (ref 3.5–5.0)
Alkaline Phosphatase: 131 U/L — ABNORMAL HIGH (ref 38–126)
Anion gap: 10 (ref 5–15)
BUN: 18 mg/dL (ref 8–23)
CO2: 25 mmol/L (ref 22–32)
Calcium: 8.2 mg/dL — ABNORMAL LOW (ref 8.9–10.3)
Chloride: 108 mmol/L (ref 98–111)
Creatinine: 0.89 mg/dL (ref 0.44–1.00)
GFR, Est AFR Am: >60 mL/min (ref >60)
GFR, Estimated: >60 mL/min (ref >60)
Glucose, Bld: 117 mg/dL — ABNORMAL HIGH (ref 70–99)
Potassium: 3.7 mmol/L (ref 3.5–5.1)
Sodium: 143 mmol/L (ref 135–145)
Total Bilirubin: 0.6 mg/dL (ref 0.3–1.2)
Total Protein: 7.2 g/dL (ref 6.5–8.1)

## 2018-10-13 LAB — CBC WITH DIFFERENTIAL (CANCER CENTER ONLY)
Abs Immature Granulocytes: 0.01 10*3/uL (ref 0.00–0.07)
Basophils Absolute: 0 10*3/uL (ref 0.0–0.1)
Basophils Relative: 0 %
Eosinophils Absolute: 0 10*3/uL (ref 0.0–0.5)
Eosinophils Relative: 1 %
HCT: 34.2 % — ABNORMAL LOW (ref 36.0–46.0)
Hemoglobin: 11.4 g/dL — ABNORMAL LOW (ref 12.0–15.0)
Immature Granulocytes: 0 %
Lymphocytes Relative: 40 %
Lymphs Abs: 1 10*3/uL (ref 0.7–4.0)
MCH: 30.4 pg (ref 26.0–34.0)
MCHC: 33.3 g/dL (ref 30.0–36.0)
MCV: 91.2 fL (ref 80.0–100.0)
Monocytes Absolute: 0.2 10*3/uL (ref 0.1–1.0)
Monocytes Relative: 8 %
Neutro Abs: 1.2 10*3/uL — ABNORMAL LOW (ref 1.7–7.7)
Neutrophils Relative %: 51 %
Platelet Count: 72 10*3/uL — ABNORMAL LOW (ref 150–400)
RBC: 3.75 MIL/uL — ABNORMAL LOW (ref 3.87–5.11)
RDW: 13.7 % (ref 11.5–15.5)
WBC Count: 2.5 10*3/uL — ABNORMAL LOW (ref 4.0–10.5)
nRBC: 0 % (ref 0.0–0.2)

## 2018-10-13 MED ORDER — SODIUM CHLORIDE 0.9% FLUSH
10.0000 mL | Freq: Once | INTRAVENOUS | Status: AC
  Administered 2018-10-13: 10 mL
  Filled 2018-10-13: qty 10

## 2018-10-13 MED ORDER — HEPARIN SOD (PORK) LOCK FLUSH 100 UNIT/ML IV SOLN
500.0000 [IU] | Freq: Once | INTRAVENOUS | Status: AC
  Administered 2018-10-13: 500 [IU]
  Filled 2018-10-13: qty 5

## 2018-10-13 NOTE — Progress Notes (Signed)
Due to low ANC (1.2) and high risk of infection, patient decided that she would like to postpone treatment this week and resume at next scheduled appointment. MD aware.

## 2018-10-13 NOTE — Progress Notes (Signed)
Per Dr. Alvy Bimler- Patient's [place] is 1.2 today. Patient may decide to have treatment today or wait until the 15th. Discussed with infusion nurse to discuss with patient.   Advised patient declined treatment today and will RTC on Sept 15th for lab and infusion.

## 2018-10-20 ENCOUNTER — Other Ambulatory Visit: Payer: Medicare Other

## 2018-10-20 ENCOUNTER — Ambulatory Visit: Payer: Medicare Other

## 2018-10-27 ENCOUNTER — Inpatient Hospital Stay: Payer: Medicare Other

## 2018-10-27 ENCOUNTER — Inpatient Hospital Stay (HOSPITAL_BASED_OUTPATIENT_CLINIC_OR_DEPARTMENT_OTHER): Payer: Medicare Other | Admitting: Hematology and Oncology

## 2018-10-27 ENCOUNTER — Other Ambulatory Visit: Payer: Self-pay | Admitting: Hematology and Oncology

## 2018-10-27 ENCOUNTER — Other Ambulatory Visit: Payer: Self-pay

## 2018-10-27 DIAGNOSIS — D509 Iron deficiency anemia, unspecified: Secondary | ICD-10-CM

## 2018-10-27 DIAGNOSIS — K7581 Nonalcoholic steatohepatitis (NASH): Secondary | ICD-10-CM

## 2018-10-27 DIAGNOSIS — Z7189 Other specified counseling: Secondary | ICD-10-CM

## 2018-10-27 DIAGNOSIS — K7460 Unspecified cirrhosis of liver: Secondary | ICD-10-CM

## 2018-10-27 DIAGNOSIS — C482 Malignant neoplasm of peritoneum, unspecified: Secondary | ICD-10-CM

## 2018-10-27 DIAGNOSIS — Z0000 Encounter for general adult medical examination without abnormal findings: Secondary | ICD-10-CM

## 2018-10-27 DIAGNOSIS — C569 Malignant neoplasm of unspecified ovary: Secondary | ICD-10-CM

## 2018-10-27 DIAGNOSIS — Z5111 Encounter for antineoplastic chemotherapy: Secondary | ICD-10-CM | POA: Diagnosis not present

## 2018-10-27 DIAGNOSIS — D61818 Other pancytopenia: Secondary | ICD-10-CM | POA: Diagnosis not present

## 2018-10-27 LAB — CMP (CANCER CENTER ONLY)
ALT: 36 U/L (ref 0–44)
AST: 32 U/L (ref 15–41)
Albumin: 4 g/dL (ref 3.5–5.0)
Alkaline Phosphatase: 132 U/L — ABNORMAL HIGH (ref 38–126)
Anion gap: 9 (ref 5–15)
BUN: 19 mg/dL (ref 8–23)
CO2: 25 mmol/L (ref 22–32)
Calcium: 8.3 mg/dL — ABNORMAL LOW (ref 8.9–10.3)
Chloride: 108 mmol/L (ref 98–111)
Creatinine: 0.88 mg/dL (ref 0.44–1.00)
GFR, Est AFR Am: >60 mL/min (ref >60)
GFR, Estimated: >60 mL/min (ref >60)
Glucose, Bld: 108 mg/dL — ABNORMAL HIGH (ref 70–99)
Potassium: 4 mmol/L (ref 3.5–5.1)
Sodium: 142 mmol/L (ref 135–145)
Total Bilirubin: 0.7 mg/dL (ref 0.3–1.2)
Total Protein: 7.3 g/dL (ref 6.5–8.1)

## 2018-10-27 LAB — CBC WITH DIFFERENTIAL (CANCER CENTER ONLY)
Abs Immature Granulocytes: 0.03 10*3/uL (ref 0.00–0.07)
Basophils Absolute: 0 10*3/uL (ref 0.0–0.1)
Basophils Relative: 0 %
Eosinophils Absolute: 0.1 10*3/uL (ref 0.0–0.5)
Eosinophils Relative: 1 %
HCT: 35.7 % — ABNORMAL LOW (ref 36.0–46.0)
Hemoglobin: 11.7 g/dL — ABNORMAL LOW (ref 12.0–15.0)
Immature Granulocytes: 1 %
Lymphocytes Relative: 29 %
Lymphs Abs: 1.4 10*3/uL (ref 0.7–4.0)
MCH: 30.2 pg (ref 26.0–34.0)
MCHC: 32.8 g/dL (ref 30.0–36.0)
MCV: 92 fL (ref 80.0–100.0)
Monocytes Absolute: 0.4 10*3/uL (ref 0.1–1.0)
Monocytes Relative: 7 %
Neutro Abs: 2.9 10*3/uL (ref 1.7–7.7)
Neutrophils Relative %: 62 %
Platelet Count: 93 10*3/uL — ABNORMAL LOW (ref 150–400)
RBC: 3.88 MIL/uL (ref 3.87–5.11)
RDW: 15.1 % (ref 11.5–15.5)
WBC Count: 4.7 10*3/uL (ref 4.0–10.5)
nRBC: 0 % (ref 0.0–0.2)

## 2018-10-27 LAB — IRON AND TIBC
Iron: 88 ug/dL (ref 41–142)
Saturation Ratios: 27 % (ref 21–57)
TIBC: 324 ug/dL (ref 236–444)
UIBC: 235 ug/dL (ref 120–384)

## 2018-10-27 LAB — FERRITIN: Ferritin: 108 ng/mL (ref 11–307)

## 2018-10-27 MED ORDER — PALONOSETRON HCL INJECTION 0.25 MG/5ML
0.2500 mg | Freq: Once | INTRAVENOUS | Status: AC
  Administered 2018-10-27: 0.25 mg via INTRAVENOUS

## 2018-10-27 MED ORDER — HEPARIN SOD (PORK) LOCK FLUSH 100 UNIT/ML IV SOLN
500.0000 [IU] | Freq: Once | INTRAVENOUS | Status: AC | PRN
  Administered 2018-10-27: 500 [IU]
  Filled 2018-10-27: qty 5

## 2018-10-27 MED ORDER — SODIUM CHLORIDE 0.9 % IV SOLN
369.6000 mg | Freq: Once | INTRAVENOUS | Status: AC
  Administered 2018-10-27: 370 mg via INTRAVENOUS
  Filled 2018-10-27: qty 37

## 2018-10-27 MED ORDER — SODIUM CHLORIDE 0.9 % IV SOLN
Freq: Once | INTRAVENOUS | Status: AC
  Administered 2018-10-27: 11:00:00 via INTRAVENOUS
  Filled 2018-10-27: qty 250

## 2018-10-27 MED ORDER — FAMOTIDINE IN NACL 20-0.9 MG/50ML-% IV SOLN
INTRAVENOUS | Status: AC
  Filled 2018-10-27: qty 50

## 2018-10-27 MED ORDER — SODIUM CHLORIDE 0.9% FLUSH
10.0000 mL | INTRAVENOUS | Status: DC | PRN
  Administered 2018-10-27: 10 mL
  Filled 2018-10-27: qty 10

## 2018-10-27 MED ORDER — INFLUENZA VAC A&B SA ADJ QUAD 0.5 ML IM PRSY
0.5000 mL | PREFILLED_SYRINGE | Freq: Once | INTRAMUSCULAR | Status: AC
  Administered 2018-10-27: 0.5 mL via INTRAMUSCULAR

## 2018-10-27 MED ORDER — SODIUM CHLORIDE 0.9 % IV SOLN
Freq: Once | INTRAVENOUS | Status: AC
  Administered 2018-10-27: 11:00:00 via INTRAVENOUS
  Filled 2018-10-27: qty 5

## 2018-10-27 MED ORDER — SODIUM CHLORIDE 0.9% FLUSH
10.0000 mL | Freq: Once | INTRAVENOUS | Status: DC
  Filled 2018-10-27: qty 10

## 2018-10-27 MED ORDER — PALONOSETRON HCL INJECTION 0.25 MG/5ML
INTRAVENOUS | Status: AC
  Filled 2018-10-27: qty 5

## 2018-10-27 MED ORDER — FAMOTIDINE IN NACL 20-0.9 MG/50ML-% IV SOLN
20.0000 mg | Freq: Once | INTRAVENOUS | Status: AC
  Administered 2018-10-27: 20 mg via INTRAVENOUS

## 2018-10-27 MED ORDER — SODIUM CHLORIDE 0.9 % IV SOLN
600.0000 mg/m2 | Freq: Once | INTRAVENOUS | Status: AC
  Administered 2018-10-27: 1140 mg via INTRAVENOUS
  Filled 2018-10-27: qty 29.98

## 2018-10-27 MED ORDER — INFLUENZA VAC A&B SA ADJ QUAD 0.5 ML IM PRSY
PREFILLED_SYRINGE | INTRAMUSCULAR | Status: AC
  Filled 2018-10-27: qty 0.5

## 2018-10-27 NOTE — Patient Instructions (Signed)
[facility] Discharge Instructions for Patients Receiving Chemotherapy  Today you received the following chemotherapy agents Gemcitabine (GEMZAR) & Carboplatin (PARAPLATIN).  To help prevent nausea and vomiting after your treatment, we encourage you to take your nausea medication as prescribed.   If you develop nausea and vomiting that is not controlled by your nausea medication, call the clinic.   BELOW ARE SYMPTOMS THAT SHOULD BE REPORTED IMMEDIATELY:  *FEVER GREATER THAN 100.5 F  *CHILLS WITH OR WITHOUT FEVER  NAUSEA AND VOMITING THAT IS NOT CONTROLLED WITH YOUR NAUSEA MEDICATION  *UNUSUAL SHORTNESS OF BREATH  *UNUSUAL BRUISING OR BLEEDING  TENDERNESS IN MOUTH AND THROAT WITH OR WITHOUT PRESENCE OF ULCERS  *URINARY PROBLEMS  *BOWEL PROBLEMS  UNUSUAL RASH Items with * indicate a potential emergency and should be followed up as soon as possible.  Feel free to call the clinic should you have any questions or concerns. The clinic phone number is [phone].  Please show the [facility] at check-in to the Emergency Department and triage nurse.  Coronavirus (COVID-19) Are you at risk?  Are you at risk for the Coronavirus (COVID-19)?  To be considered HIGH RISK for Coronavirus (COVID-19), you have to meet the following criteria:  . Traveled to [place], [place], [place], [place] or [place]; or in the [place] to [place], [place], [place], or [place]; and have fever, cough, and shortness of breath within the last 2 weeks of travel OR . Been in close contact with a person diagnosed with COVID-19 within the last 2 weeks and have fever, cough, and shortness of breath . IF YOU DO NOT MEET THESE CRITERIA, YOU ARE CONSIDERED LOW RISK FOR COVID-19.  What to do if you are HIGH RISK for COVID-19?  Marland Kitchen If you are having a medical emergency, call 911. . Seek medical care right away. Before you go to a doctor's office, urgent care or emergency department,  call ahead and tell them about your recent travel, contact with someone diagnosed with COVID-19, and your symptoms. You should receive instructions from your physician's office regarding next steps of care.  . When you arrive at healthcare provider, tell the healthcare staff immediately you have returned from visiting [place], [place], [place], [place] or [place]; or traveled in the [place] to [place], [place], [place], or [place]; in the last two weeks or you have been in close contact with a person diagnosed with COVID-19 in the last 2 weeks.   . Tell the health care staff about your symptoms: fever, cough and shortness of breath. . After you have been seen by a medical provider, you will be either: o Tested for (COVID-19) and discharged home on quarantine except to seek medical care if symptoms worsen, and asked to  - Stay home and avoid contact with others until you get your results (4-5 days)  - Avoid travel on public transportation if possible (such as bus, train, or airplane) or o Sent to the Emergency Department by EMS for evaluation, COVID-19 testing, and possible admission depending on your condition and test results.  What to do if you are LOW RISK for COVID-19?  Reduce your risk of any infection by using the same precautions used for avoiding the common cold or flu:  Marland Kitchen Wash your hands often with soap and warm water for at least 20 seconds.  If soap and water are not readily available, use an alcohol-based hand sanitizer with at least 60% alcohol.  Marland Kitchen  If coughing or sneezing, cover your mouth and nose by coughing or sneezing into the elbow areas of your shirt or coat, into a tissue or into your sleeve (not your hands). . Avoid shaking hands with others and consider head nods or verbal greetings only. . Avoid touching your eyes, nose, or mouth with unwashed hands.  . Avoid close contact with people who are sick. . Avoid places or events with large numbers of people in one  location, like concerts or sporting events. . Carefully consider travel plans you have or are making. . If you are planning any travel outside or inside the [place], visit the CDC's Travelers' Health webpage for the latest health notices. . If you have some symptoms but not all symptoms, continue to monitor at home and seek medical attention if your symptoms worsen. . If you are having a medical emergency, call 911.   [facility] / e-Visit: [email or web address]         [facility]: [facility]: [phone]                   [facility]: [phone]

## 2018-10-28 ENCOUNTER — Encounter: Payer: Self-pay | Admitting: Hematology and Oncology

## 2018-10-28 ENCOUNTER — Telehealth: Payer: Self-pay | Admitting: *Deleted

## 2018-10-28 LAB — CA 125: Cancer Antigen (CA) 125: 99.9 U/mL — ABNORMAL HIGH (ref 0.0–38.1)

## 2018-10-28 NOTE — Assessment & Plan Note (Signed)
She has severe pancytopenia secondary to recent chemotherapy and also background history of liver disease I plan to reduce the dose of gemcitabine further  I will change her treatment cycle to give treatment on day 1 only She does not need transfusion support

## 2018-10-28 NOTE — Telephone Encounter (Signed)
-----   Message from [name], MD sent at 10/28/2018  8:49 AM EDT ----- Regarding: pls let her know CA-125 is better

## 2018-10-28 NOTE — Telephone Encounter (Signed)
Telephone call to patient and advised lab results as directed below. Patient verbalized an understanding and will call with any further questions or concerns. Patient confirms appt in October.

## 2018-10-28 NOTE — Assessment & Plan Note (Signed)
She tolerated treatment poorly with severe pancytopenia Tumor marker has improved We will continue treatment as scheduled but I plan to omit day 8 She will only get carboplatin and gemcitabine on day 1 every 21 days I recommend 3 months of treatment before repeat imaging study She does not need transfusion support

## 2018-10-28 NOTE — Assessment & Plan Note (Signed)
She has intermittent elevated liver enzymes She is not symptomatic Observe only for now

## 2018-10-28 NOTE — Progress Notes (Signed)
[facility] OFFICE PROGRESS NOTE  Patient Care Team: [name], MD as PCP - General (Hematology and Oncology)  ASSESSMENT & PLAN:  Primary peritoneal carcinomatosis [facility]) She tolerated treatment poorly with severe pancytopenia Tumor marker has improved We will continue treatment as scheduled but I plan to omit day 8 She will only get carboplatin and gemcitabine on day 1 every 21 days I recommend 3 months of treatment before repeat imaging study She does not need transfusion support  Pancytopenia, acquired ([place]) She has severe pancytopenia secondary to recent chemotherapy and also background history of liver disease I plan to reduce the dose of gemcitabine further  I will change her treatment cycle to give treatment on day 1 only She does not need transfusion support  Liver cirrhosis secondary to NASH [facility]) She has intermittent elevated liver enzymes She is not symptomatic Observe only for now   No orders of the defined types were placed in this encounter.   INTERVAL HISTORY: Please see below for problem oriented charting. 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  No worrisome liver mass.  There are associated portal venous collaterals and splenomegaly consistent with portal venous hypertension. 2.  No other significant upper abdominal findings   11/08/2013 Imaging   US showed ascites   11/08/2013 Initial Diagnosis   Patient had ~ 2 months of some urinary incontinence, saw Dr Ronita Hipps in 10-2013, with pelvic mass on exam    11/12/2013 Imaging   There are findings of progressive hepatic cirrhosis with a large amount  of ascites and mild splenomegaly. There are venous collaterals present. 2. There is abnormal thickening of the peritoneal surface along the left mid and lower abdominal wall. There is abnormal soft tissue density in the pelvis as well which could reflect the clinically suspected ovarian malignancy but the findings are nondiagnostic. 3. There is a new small left pleural effusion. 4. There is no acute bowel abnormality.   11/18/2013 Imaging   Successful ultrasound guided diagnostic and therapeutic paracentesis yielding 4.1 liters of ascites.    11/18/2013 Pathology Results   PERITONEAL/ASCITIC FLUID (SPECIMEN 1 OF 1 COLLECTED 11/18/13): SEROUS CARCINOMA, PLEASE SEE COMMENT.   12/01/2013 Tumor Marker   Patient's tumor was tested for the following markers: CA125 Results of the tumor marker test revealed 1938   12/03/2013 Imaging   Successful ultrasound-guided therapeutic paracentesis yielding 3.6 liters of peritoneal fluid.   12/07/2013 - 05/30/2014 Chemotherapy   She received 3 cycles of carboplatin and Taxol, interrupted cycle 4 for surgery.  She subsequently completed 3 more cycles of chemotherapy after surgery   12/20/2013 Imaging   Successful ultrasound-guided diagnostic and therapeutic paracentesis yielding 2.2 liters of peritoneal fluid   12/20/2013 Pathology Results   PERITONEAL/ASCITIC FLUID(SPECIMEN 1 OF 1 COLLECTED 12/20/13): MALIGNANT CELLS CONSISTENT WITH SEROUS CARCINOMA   01/13/2014 Tumor Marker   Patient's tumor was tested for the following markers: CA125 Results of the tumor marker test revealed 227   02/07/2014 Tumor Marker   Patient's tumor was tested for the following markers: CA125 Results of the tumor marker test revealed 130   02/15/2014 Genetic Testing   Genetics testing from 02-2014 normal ([employer] panel)   02/23/2014 Imaging   Interval  decrease in ascites. 2. Morphologic changes in the liver consistent with cirrhosis. 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 Portal vein remains patent at this time. 3. Persistent but decreased abnormal soft tissue attenuation tracking in the omental fat. This may be secondary to interval improvement in metastatic disease. 4. Peritoneal thickening seen in the left abdomen and pelvis on the previous study has decreased and nearly resolved in the interval. This also suggests interval improvement in metastatic disease.    03/07/2014 Procedure   Ultrasound and fluoroscopically guided right internal jugular single lumen power port catheter insertion. Tip in the SVC/RA junction. Catheter ready for use.   03/17/2014 Tumor Marker   Patient's tumor was tested for the following markers: CA125 Results of the tumor marker test revealed 45   03/29/2014 Pathology Results   1. Omentum, resection for tumor - HIGH GRADE SEROUS CARCINOMA, SEE COMMENT. 2. Ovary and fallopian tube, right - HIGH GRADE SEROUS CARCINOMA, SEE COMMENT. 3. Ovary and fallopian tube, left - HIGH GRADE SEROUS CARCINOMA, SEE COMMENT. Diagnosis Note 1. Nests and clusters of malignant cells are invading the omental tissue (part #1) with associated fibrosis. The cells are pleomorphic with prominent nucleoli. There are scattered psammoma bodies. The ovaries are atrophic and exhibit multiple foci of surface based invasive carcinoma and associated fibrosis. The fallopian tubes have a few foci of carcinoma, also superficially located. There are no precursor lesions noted in the ovaries or fallopian tubes (the entire tubes and ovaries were submitted for evaluation). While there is some retraction artifact, there are several foci suspicious for lymphovascular invasion. 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 Given the fibrosis, there does appear to be a small amount of treatment effect, however, there is abundant  residual tumor.    03/29/2014 Surgery   Procedure(s) Performed: 1. Exploratory laparotomy with bilateral salpingo-oophorectomy, omentectomy radical tumor debulking for ovarian cancer .  Surgeon: Thereasa Solo, MD.  Assistant Surgeon: Lahoma Crocker, M.D. Assistant: (an MD assistant was necessary for tissue manipulation, retraction and positioning due to the complexity of the case and hospital policies).  Operative Findings: 10cm omental cake from hepatic to splenic flexure, densely adherent to transverse colon. Milliary studding of tumor implants (<47m, too numerous in number to count) adherent to the mesentery of the small bowel and small bowel wall. Small volume (100cc) ascites. Small ovaries bilaterally, densely adherent to the pelvic cul de sac. Left ovary and tube densely adherent to sigmoid colon. Cirrhotic liver with hepatomegaly and splenomegaly.     This represented an optimal cytoreduction (R1) with visible disease residual on the bowel wall and mesentery (millial, <332mimplants).     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 2. 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    08/18/2014 Tumor Marker   Patient's tumor was tested for the following markers: CA125 Results of the tumor marker test revealed 16  10/13/2014 Tumor Marker   Patient's tumor was tested for the following markers: CA125 Results of the tumor marker test revealed 14   11/21/2014 Tumor Marker   Patient's tumor was tested for  the following markers: CA125 Results of the tumor marker test revealed 16   12/28/2014 Tumor Marker   Patient's tumor was tested for the following markers: CA125 Results of the tumor marker test revealed 762   02/27/2015 Tumor Marker   Patient's tumor was tested for the following markers: CA125 Results of the tumor marker test revealed 20.2   03/22/2015 Imaging   Filler appearance to the prior exam, with very subtle fluid tracking along portions of the liver, and very minimal nodularity along the right paracolic gutter representing residua from the prior peritoneal cancer. The current abnormalities could simply be post therapy findings rather than necessarily representing residual malignancy. No new or enlarging lesions are identified. 2. Hepatic cirrhosis and splenomegaly. There is some gastric varices suggesting portal venous hypertension. 3. Left foraminal stenosis at L4-5 due to spurring. There is likely also some central narrowing of the thecal sac at this level. 4. Bibasilar scarring. 5. Chronic mild left mid kidney scarring   03/27/2015 Tumor Marker   Patient's tumor was tested for the following markers: CA125 Results of the tumor marker test revealed 25.3   04/24/2015 Imaging   Disc bulge L2-3 with mild spinal stenosis and right foraminal encroachment. 2. Disc bulge L3-4 with mild spinal stenosis. 3. Multifactorial spinal, left lateral recess and foraminal stenosis L4-5. There is grade 1 anterolisthesis without evident dynamic instability.   05/29/2015 Tumor Marker   Patient's tumor was tested for the following markers: CA125 Results of the tumor marker test revealed 29.9   08/21/2015 Tumor Marker   Patient's tumor was tested for the following markers: CA125 Results of the tumor marker test revealed 45   09/18/2015 Tumor Marker   Patient's tumor was tested for the following markers: CA125 Results of the tumor marker test revealed 47   09/27/2015 Imaging   New small bowel mesenteric  nodule and enlarged left external iliac lymph node, highly worrisome for metastatic disease. 2. Cirrhosis and splenomegaly   10/10/2015 Tumor Marker   Patient's tumor was tested for the following markers: CA125 Results of the tumor marker test revealed 53.4   10/10/2015 - 12/11/2015 Chemotherapy   She received 4 cycles of carboplatin only   11/20/2015 Tumor Marker   Patient's tumor was tested for the following markers: CA125 Results of the tumor marker test revealed 41.3   12/20/2015 Imaging   CT: Mild left external iliac lymphadenopathy is slightly decreased. Mildly enlarged lower mesenteric nodule is slightly decreased. 2. No new or progressive metastatic disease in the abdomen or pelvis. 3. Cirrhosis. Stable subcentimeter hypodense left liver lobe lesion. No new liver lesions. 4. Stable mild splenomegaly.  No ascites. 5. Mild sigmoid diverticulosis.   01/01/2016 -  Anti-estrogen oral therapy   She was placed on tamoxifen. Had letrozole initially but switched to tamoxifen due to poor tolerance   01/15/2016 Tumor Marker   Patient's tumor was tested for the following markers: CA125 Results of the tumor marker test revealed 31.4   02/19/2016 Tumor Marker   Patient's tumor was tested for the following markers: CA125 Results of the tumor marker test revealed 36.5   05/13/2016 Imaging   No evidence of disease progression within the abdomen or pelvis. Previously noted small central mesenteric nodule and left external iliac node appear slightly smaller. 2. Stable changes of  hepatic cirrhosis and portal hypertension with associated splenomegaly. No new or enlarging hepatic lesions are identified. 3. No acute findings.   05/13/2016 Tumor Marker   Patient's tumor was tested for the following markers: CA125 Results of the tumor marker test revealed 41.2   06/24/2016 Tumor Marker   Patient's tumor was tested for the following markers: CA125 Results of the tumor marker test revealed 47.3   08/20/2016  Imaging   1. The left external iliac lymph node is slightly increased in size compared to the prior exam, currently 1.1 cm and previously 0.9 cm in diameter. 2. Stable central mesenteric lymph node at 0.9 cm in diameter. 3. Stable trace free pelvic fluid and stable slight thickening along the right paracolic gutter, without well-defined peritoneal nodularity. 4. Other imaging findings of potential clinical significance: Subsegmental atelectasis or scarring in the lung bases. Hepatic cirrhosis. Left renal scarring. Mild splenomegaly. Sigmoid colon diverticulosis. Impingement at L4-5 due to spondylosis and degenerative disc disease broad Schmorl' s nodes at L3-L4. Pelvic floor laxity   08/23/2016 Tumor Marker   Patient's tumor was tested for the following markers: CA125 Results of the tumor marker test revealed 80.2   09/04/2016 Imaging   LV EF: 60% -   65%   09/12/2016 Tumor Marker   Patient's tumor was tested for the following markers: CA125 Results of the tumor marker test revealed 98.5   09/12/2016 - 01/29/2018 Chemotherapy   The patient received Doxil and Avastin. Avastin was discontinued in Dec 2019 due to GI hemorrhage   09/26/2016 Tumor Marker   Patient's tumor was tested for the following markers: CA125 Results of the tumor marker test revealed 68.9   10/10/2016 Tumor Marker   Patient's tumor was tested for the following markers: CA125 Results of the tumor marker test revealed 65.6   11/07/2016 Tumor Marker   Patient's tumor was tested for the following markers: CA125 Results of the tumor marker test revealed 46.4   11/29/2016 Imaging   Stable mild peritoneal thickening, suspicious for peritoneal carcinomatosis. No new or progressive disease identified. No evidence of ascites.  No significant change in 10 mm left external iliac and 8 mm mesenteric lymph nodes.  Stable hepatic cirrhosis, and splenomegaly consistent with portal venous hypertension.  Colonic diverticulosis, without  radiographic evidence of diverticulitis.   12/02/2016 Imaging   Normal LV size with EF 55%. Basal inferior and basal inferoseptal hypokinesis. Normal RV size and systolic function. No significant valvular abnormalities.   12/05/2016 Tumor Marker   Patient's tumor was tested for the following markers: CA125 Results of the tumor marker test revealed 49.1   01/09/2017 Tumor Marker   Patient's tumor was tested for the following markers: CA125 Results of the tumor marker test revealed 47.2   02/06/2017 Tumor Marker   Patient's tumor was tested for the following markers: CA125 Results of the tumor marker test revealed 44.1   02/18/2017 Imaging   Stable mild peritoneal thickening. No new or progressive disease identified within the abdomen or pelvis.  Stable tiny sub-cm left external iliac and mesenteric lymph nodes.  Stable hepatic cirrhosis, and splenomegaly consistent with portal venous hypertension. No evidence of hepatic neoplasm.  Colonic diverticulosis, without radiographic evidence of diverticulitis.   03/06/2017 Tumor Marker   Patient's tumor was tested for the following markers: CA125 Results of the tumor marker test revealed 50.4   03/17/2017 Imaging   - Left ventricle: The cavity size was normal. Wall thickness was normal. Systolic function was normal. The estimated ejection fraction was  in the range of 55% to 60%. Wall motion was normal; there were no regional wall motion abnormalities. Features are consistent with a pseudonormal left ventricular filling pattern, with concomitant abnormal relaxation and increased filling pressure (grade 2 diastolic dysfunction). - Mitral valve: There was mild regurgitation. - Left atrium: The atrium was mildly dilated   04/03/2017 Tumor Marker   Patient's tumor was tested for the following markers: CA125 Results of the tumor marker test revealed 47.2   05/14/2017 Imaging   CT scan of abdomen and pelvis Stable mild peritoneal thickening. No new  or progressive disease within the abdomen or pelvis.  Stable hepatic cirrhosis. Stable splenomegaly, consistent with portal venous hypertension. No evidence of hepatic neoplasm.   05/15/2017 Tumor Marker   Patient's tumor was tested for the following markers: CA125 Results of the tumor marker test revealed 56.1   06/12/2017 Tumor Marker   Patient's tumor was tested for the following markers: CA125 Results of the tumor marker test revealed 49.2   07/10/2017 Tumor Marker   Patient's tumor was tested for the following markers: CA125 Results of the tumor marker test revealed 46.3   08/07/2017 Tumor Marker   Patient's tumor was tested for the following markers: CA125 Results of the tumor marker test revealed 52.9   08/20/2017 Imaging   1. Mild omental/peritoneal haziness, unchanged. No evidence of new metastatic disease. 2. Cirrhosis with splenomegaly.   09/04/2017 Tumor Marker   Patient's tumor was tested for the following markers: CA125 Results of the tumor marker test revealed 48.5   11/13/2017 Tumor Marker   Patient's tumor was tested for the following markers: CA125 Results of the tumor marker test revealed 55.8   12/17/2017 Imaging   1. No findings to suggest metastatic disease in the abdomen or pelvis. 2. Severe hepatic cirrhosis with evidence of portal hypertension, as demonstrated by dilated portal vein, splenomegaly and portosystemic collateral pathways, including gastric and esophageal varices.  3. Colonic diverticulosis without evidence of acute diverticulitis at this time. 4. Additional incidental findings, as above.   01/16/2018 Tumor Marker   Patient's tumor was tested for the following markers: CA125 Results of the tumor marker test revealed 63.7   02/10/2018 Procedure   EGD - Recently bleeding grade III and large (> 5 mm) esophageal varices. Completely eradicated. Banded. - Portal hypertensive gastropathy. - Normal examined duodenum. - No specimens collected    02/13/2018 Tumor Marker   Patient's tumor was tested for the following markers: CA125 Results of the tumor marker test revealed 82.7   02/25/2018 Imaging   Bone density showed mild osteopenia   04/08/2018 Tumor Marker   Patient's tumor was tested for the following markers: CA125 Results of the tumor marker test revealed 82.7   04/08/2018 Imaging   1. No definite omental or peritoneal surface lesions. However, there are 2 slowly enlarging lymph nodes noted. One is in the small bowel mesentery and the other is in the left deep pelvis. Could not exclude recurrent disease. 2. Stable advanced cirrhotic changes involving the liver with portal venous hypertension, portal venous collaterals, esophageal varices and splenomegaly. No worrisome hepatic lesions. 3. Diffuse wall thickening of the colon could suggest diffuse colitis or could be due to low albumin. Recommend correlation with any symptoms such as diarrhea.   05/21/2018 Tumor Marker   Patient's tumor was tested for the following markers: CA125 Results of the tumor marker test revealed 86   09/07/2018 Tumor Marker   Patient's tumor was tested for the following markers:CA-125 Results of  the tumor marker test revealed 111   09/29/2018 Tumor Marker   Patient's tumor was tested for the following markers: CA-125 Results of the tumor marker test revealed 121.   09/29/2018 -  Chemotherapy   The patient had carboplatin and gemzar for chemotherapy treatment.     10/27/2018 Tumor Marker   Patient's tumor was tested for the following markers: CA-125 Results of the tumor marker test revealed 99.9     REVIEW OF SYSTEMS:   Constitutional: Denies fevers, chills or abnormal weight loss Eyes: Denies blurriness of vision Ears, nose, mouth, throat, and face: Denies mucositis or sore throat Respiratory: Denies cough, dyspnea or wheezes Cardiovascular: Denies palpitation, chest discomfort or lower extremity swelling Gastrointestinal:  Denies nausea,  heartburn or change in bowel habits Skin: Denies abnormal skin rashes Lymphatics: Denies new lymphadenopathy or easy bruising Neurological:Denies numbness, tingling or new weaknesses Behavioral/Psych: Mood is stable, no new changes  All other systems were reviewed with the patient and are negative.  I have reviewed the past medical history, past surgical history, social history and family history with the patient and they are unchanged from previous note.  ALLERGIES:  is allergic to shellfish allergy; benadryl [diphenhydramine hcl]; penicillins; and tape.  MEDICATIONS:  Current Outpatient Medications  Medication Sig Dispense Refill  . cholecalciferol (VITAMIN D) 1000 units tablet Take 1,000 Units by mouth at bedtime.     . gabapentin (NEURONTIN) 300 MG capsule TAKE 2 CAPSULES(600 MG) BY MOUTH TWICE DAILY (Patient taking differently: Take 600 mg by mouth 2 (two) times daily. ) 120 capsule 11  . hydrochlorothiazide (MICROZIDE) 12.5 MG capsule Take 12.5 mg by mouth daily after breakfast.   1  . HYDROcodone-acetaminophen (NORCO/VICODIN) 5-325 MG tablet Take 1 tablet by mouth every 6 (six) hours as needed for moderate pain. 30 tablet 0  . insulin degludec (TRESIBA FLEXTOUCH) 100 UNIT/ML SOPN FlexTouch Pen Inject 18 Units into the skin daily after breakfast.     . lidocaine-prilocaine (EMLA) cream Apply to Porta-cath  1-2 hours prior to access as directed. (Patient taking differently: Apply 1 application topically daily as needed (port access). ) 30 g 9  . nadolol (CORGARD) 20 MG tablet Take 1 tablet (20 mg total) by mouth daily. (Patient taking differently: Take 20 mg by mouth at bedtime. ) 90 tablet 3  . ondansetron (ZOFRAN) 8 MG tablet Take 1 tablet (8 mg total) by mouth every 8 (eight) hours as needed. Start on the third day after chemotherapy. 30 tablet 1  . pantoprazole (PROTONIX) 40 MG tablet Take 1 tablet (40 mg total) by mouth daily.    . prochlorperazine (COMPAZINE) 10 MG tablet Take 1  tablet (10 mg total) by mouth every 6 (six) hours as needed (Nausea or vomiting). 30 tablet 1  . rotigotine (NEUPRO) 4 MG/24HR APPLY 1 PATCH EXTERNALLY TO THE SKIN DAILY (Patient taking differently: Place 1 patch onto the skin daily. ) 30 patch 5   No current facility-administered medications for this visit.     PHYSICAL EXAMINATION: ECOG PERFORMANCE STATUS: 1 - Symptomatic but completely ambulatory  Vitals:   10/27/18 1010  BP: (!) 150/54  Pulse: 60  Resp: 18  Temp: 98.2 F (36.8 C)  SpO2: 99%   Filed Weights   10/27/18 1010  Weight: 188 lb 9.6 oz (85.5 kg)    GENERAL:alert, no distress and comfortable SKIN: skin color, texture, turgor are normal, no rashes or significant lesions EYES: normal, Conjunctiva are pink and non-injected, sclera clear OROPHARYNX:no exudate, no erythema and  lips, buccal mucosa, and tongue normal  NECK: supple, thyroid normal size, non-tender, without nodularity LYMPH:  no palpable lymphadenopathy in the cervical, axillary or inguinal LUNGS: clear to auscultation and percussion with normal breathing effort HEART: regular rate & rhythm and no murmurs and no lower extremity edema ABDOMEN:abdomen soft, non-tender and normal bowel sounds Musculoskeletal:no cyanosis of digits and no clubbing  NEURO: alert & oriented x 3 with fluent speech, no focal motor/sensory deficits  LABORATORY DATA:  I have reviewed the data as listed    Component Value Date/Time   NA 142 10/27/2018 0908   NA 142 02/06/2017 0742   K 4.0 10/27/2018 0908   K 3.9 02/06/2017 0742   CL 108 10/27/2018 0908   CO2 25 10/27/2018 0908   CO2 22 02/06/2017 0742   GLUCOSE 108 (H) 10/27/2018 0908   GLUCOSE 156 (H) 02/06/2017 0742   BUN 19 10/27/2018 0908   BUN 13.5 02/06/2017 0742   CREATININE 0.88 10/27/2018 0908   CREATININE 0.8 02/06/2017 0742   CALCIUM 8.3 (L) 10/27/2018 0908   CALCIUM 8.5 02/06/2017 0742   PROT 7.3 10/27/2018 0908   PROT 6.9 02/06/2017 0742   ALBUMIN 4.0  10/27/2018 0908   ALBUMIN 3.7 02/06/2017 0742   AST 32 10/27/2018 0908   AST 47 (H) 02/06/2017 0742   ALT 36 10/27/2018 0908   ALT 54 02/06/2017 0742   ALKPHOS 132 (H) 10/27/2018 0908   ALKPHOS 130 02/06/2017 0742   BILITOT 0.7 10/27/2018 0908   BILITOT 0.65 02/06/2017 0742   GFRNONAA >60 10/27/2018 0908   GFRAA >60 10/27/2018 0908    No results found for: SPEP, UPEP  Lab Results  Component Value Date   WBC 4.7 10/27/2018   NEUTROABS 2.9 10/27/2018   HGB 11.7 (L) 10/27/2018   HCT 35.7 (L) 10/27/2018   MCV 92.0 10/27/2018   PLT 93 (L) 10/27/2018      Chemistry      Component Value Date/Time   NA 142 10/27/2018 0908   NA 142 02/06/2017 0742   K 4.0 10/27/2018 0908   K 3.9 02/06/2017 0742   CL 108 10/27/2018 0908   CO2 25 10/27/2018 0908   CO2 22 02/06/2017 0742   BUN 19 10/27/2018 0908   BUN 13.5 02/06/2017 0742   CREATININE 0.88 10/27/2018 0908   CREATININE 0.8 02/06/2017 0742      Component Value Date/Time   CALCIUM 8.3 (L) 10/27/2018 0908   CALCIUM 8.5 02/06/2017 0742   ALKPHOS 132 (H) 10/27/2018 0908   ALKPHOS 130 02/06/2017 0742   AST 32 10/27/2018 0908   AST 47 (H) 02/06/2017 0742   ALT 36 10/27/2018 0908   ALT 54 02/06/2017 0742   BILITOT 0.7 10/27/2018 0908   BILITOT 0.65 02/06/2017 0742      All questions were answered. The patient knows to call the clinic with any problems, questions or concerns. No barriers to learning was detected.  I spent 25 minutes counseling the patient face to face. The total time spent in the appointment was 30 minutes and more than 50% was on counseling and review of test results  [name], MD 10/28/2018 1:03 PM

## 2018-11-24 ENCOUNTER — Inpatient Hospital Stay: Payer: Medicare Other

## 2018-11-24 ENCOUNTER — Other Ambulatory Visit: Payer: Self-pay

## 2018-11-24 ENCOUNTER — Other Ambulatory Visit: Payer: Self-pay | Admitting: Medical

## 2018-11-24 ENCOUNTER — Inpatient Hospital Stay (HOSPITAL_BASED_OUTPATIENT_CLINIC_OR_DEPARTMENT_OTHER): Payer: Medicare Other | Admitting: Medical

## 2018-11-24 ENCOUNTER — Telehealth: Payer: Self-pay | Admitting: Hematology and Oncology

## 2018-11-24 ENCOUNTER — Inpatient Hospital Stay (HOSPITAL_BASED_OUTPATIENT_CLINIC_OR_DEPARTMENT_OTHER): Payer: Medicare Other | Admitting: Hematology and Oncology

## 2018-11-24 ENCOUNTER — Encounter: Payer: Self-pay | Admitting: Hematology and Oncology

## 2018-11-24 ENCOUNTER — Inpatient Hospital Stay: Payer: Medicare Other | Attending: Hematology and Oncology

## 2018-11-24 VITALS — BP 156/58 | HR 86 | Resp 19

## 2018-11-24 DIAGNOSIS — G8929 Other chronic pain: Secondary | ICD-10-CM | POA: Diagnosis not present

## 2018-11-24 DIAGNOSIS — Z794 Long term (current) use of insulin: Secondary | ICD-10-CM | POA: Insufficient documentation

## 2018-11-24 DIAGNOSIS — C482 Malignant neoplasm of peritoneum, unspecified: Secondary | ICD-10-CM

## 2018-11-24 DIAGNOSIS — D696 Thrombocytopenia, unspecified: Secondary | ICD-10-CM | POA: Diagnosis not present

## 2018-11-24 DIAGNOSIS — M545 Low back pain, unspecified: Secondary | ICD-10-CM

## 2018-11-24 DIAGNOSIS — R971 Elevated cancer antigen 125 [CA 125]: Secondary | ICD-10-CM | POA: Insufficient documentation

## 2018-11-24 DIAGNOSIS — C786 Secondary malignant neoplasm of retroperitoneum and peritoneum: Secondary | ICD-10-CM | POA: Diagnosis present

## 2018-11-24 DIAGNOSIS — D61810 Antineoplastic chemotherapy induced pancytopenia: Secondary | ICD-10-CM | POA: Insufficient documentation

## 2018-11-24 DIAGNOSIS — R0789 Other chest pain: Secondary | ICD-10-CM

## 2018-11-24 DIAGNOSIS — Z79899 Other long term (current) drug therapy: Secondary | ICD-10-CM | POA: Insufficient documentation

## 2018-11-24 DIAGNOSIS — C569 Malignant neoplasm of unspecified ovary: Secondary | ICD-10-CM

## 2018-11-24 DIAGNOSIS — Z7189 Other specified counseling: Secondary | ICD-10-CM

## 2018-11-24 DIAGNOSIS — T8090XA Unspecified complication following infusion and therapeutic injection, initial encounter: Secondary | ICD-10-CM

## 2018-11-24 DIAGNOSIS — E119 Type 2 diabetes mellitus without complications: Secondary | ICD-10-CM | POA: Diagnosis not present

## 2018-11-24 DIAGNOSIS — Z5111 Encounter for antineoplastic chemotherapy: Secondary | ICD-10-CM | POA: Insufficient documentation

## 2018-11-24 DIAGNOSIS — D509 Iron deficiency anemia, unspecified: Secondary | ICD-10-CM

## 2018-11-24 LAB — CMP (CANCER CENTER ONLY)
ALT: 37 U/L (ref 0–44)
AST: 37 U/L (ref 15–41)
Albumin: 4 g/dL (ref 3.5–5.0)
Alkaline Phosphatase: 144 U/L — ABNORMAL HIGH (ref 38–126)
Anion gap: 11 (ref 5–15)
BUN: 24 mg/dL — ABNORMAL HIGH (ref 8–23)
CO2: 24 mmol/L (ref 22–32)
Calcium: 9.5 mg/dL (ref 8.9–10.3)
Chloride: 108 mmol/L (ref 98–111)
Creatinine: 0.99 mg/dL (ref 0.44–1.00)
GFR, Est AFR Am: >60 mL/min (ref >60)
GFR, Estimated: 57 mL/min — ABNORMAL LOW (ref >60)
Glucose, Bld: 159 mg/dL — ABNORMAL HIGH (ref 70–99)
Potassium: 4.4 mmol/L (ref 3.5–5.1)
Sodium: 143 mmol/L (ref 135–145)
Total Bilirubin: 0.8 mg/dL (ref 0.3–1.2)
Total Protein: 8.1 g/dL (ref 6.5–8.1)

## 2018-11-24 LAB — CBC WITH DIFFERENTIAL (CANCER CENTER ONLY)
Abs Immature Granulocytes: 0.03 10*3/uL (ref 0.00–0.07)
Basophils Absolute: 0 10*3/uL (ref 0.0–0.1)
Basophils Relative: 1 %
Eosinophils Absolute: 0.1 10*3/uL (ref 0.0–0.5)
Eosinophils Relative: 2 %
HCT: 36.5 % (ref 36.0–46.0)
Hemoglobin: 12.1 g/dL (ref 12.0–15.0)
Immature Granulocytes: 1 %
Lymphocytes Relative: 26 %
Lymphs Abs: 1.1 10*3/uL (ref 0.7–4.0)
MCH: 30.8 pg (ref 26.0–34.0)
MCHC: 33.2 g/dL (ref 30.0–36.0)
MCV: 92.9 fL (ref 80.0–100.0)
Monocytes Absolute: 0.4 10*3/uL (ref 0.1–1.0)
Monocytes Relative: 8 %
Neutro Abs: 2.8 10*3/uL (ref 1.7–7.7)
Neutrophils Relative %: 62 %
Platelet Count: 82 10*3/uL — ABNORMAL LOW (ref 150–400)
RBC: 3.93 MIL/uL (ref 3.87–5.11)
RDW: 16.5 % — ABNORMAL HIGH (ref 11.5–15.5)
WBC Count: 4.4 10*3/uL (ref 4.0–10.5)
nRBC: 0 % (ref 0.0–0.2)

## 2018-11-24 LAB — IRON AND TIBC
Iron: 106 ug/dL (ref 41–142)
Saturation Ratios: 27 % (ref 21–57)
TIBC: 389 ug/dL (ref 236–444)
UIBC: 283 ug/dL (ref 120–384)

## 2018-11-24 LAB — FERRITIN: Ferritin: 152 ng/mL (ref 11–307)

## 2018-11-24 MED ORDER — SODIUM CHLORIDE 0.9% FLUSH
10.0000 mL | INTRAVENOUS | Status: DC | PRN
  Administered 2018-11-24: 10 mL
  Filled 2018-11-24: qty 10

## 2018-11-24 MED ORDER — SODIUM CHLORIDE 0.9% FLUSH
10.0000 mL | Freq: Once | INTRAVENOUS | Status: AC
  Administered 2018-11-24: 08:00:00 10 mL
  Filled 2018-11-24: qty 10

## 2018-11-24 MED ORDER — METHYLPREDNISOLONE SODIUM SUCC 125 MG IJ SOLR
125.0000 mg | Freq: Once | INTRAMUSCULAR | Status: AC
  Administered 2018-11-24: 125 mg via INTRAVENOUS

## 2018-11-24 MED ORDER — FAMOTIDINE IN NACL 20-0.9 MG/50ML-% IV SOLN
INTRAVENOUS | Status: AC
  Filled 2018-11-24: qty 50

## 2018-11-24 MED ORDER — SODIUM CHLORIDE 0.9 % IV SOLN
Freq: Once | INTRAVENOUS | Status: AC
  Administered 2018-11-24: 10:00:00 via INTRAVENOUS
  Filled 2018-11-24: qty 5

## 2018-11-24 MED ORDER — FAMOTIDINE IN NACL 20-0.9 MG/50ML-% IV SOLN
20.0000 mg | Freq: Once | INTRAVENOUS | Status: AC
  Administered 2018-11-24: 20 mg via INTRAVENOUS

## 2018-11-24 MED ORDER — SODIUM CHLORIDE 0.9 % IV SOLN
Freq: Once | INTRAVENOUS | Status: AC
  Administered 2018-11-24: 10:00:00 via INTRAVENOUS
  Filled 2018-11-24: qty 250

## 2018-11-24 MED ORDER — HEPARIN SOD (PORK) LOCK FLUSH 100 UNIT/ML IV SOLN
500.0000 [IU] | Freq: Once | INTRAVENOUS | Status: AC | PRN
  Administered 2018-11-24: 500 [IU]
  Filled 2018-11-24: qty 5

## 2018-11-24 MED ORDER — PALONOSETRON HCL INJECTION 0.25 MG/5ML
INTRAVENOUS | Status: AC
  Filled 2018-11-24: qty 5

## 2018-11-24 MED ORDER — MORPHINE SULFATE (PF) 4 MG/ML IV SOLN
INTRAVENOUS | Status: AC
  Filled 2018-11-24: qty 1

## 2018-11-24 MED ORDER — EPINEPHRINE 1 MG/10ML IJ SOSY
0.3000 mg | PREFILLED_SYRINGE | Freq: Once | INTRAMUSCULAR | Status: DC
  Filled 2018-11-24: qty 10

## 2018-11-24 MED ORDER — MONTELUKAST SODIUM 10 MG PO TABS
10.0000 mg | ORAL_TABLET | Freq: Every day | ORAL | Status: DC
  Administered 2018-11-24: 10 mg via ORAL

## 2018-11-24 MED ORDER — DIPHENHYDRAMINE HCL 50 MG/ML IJ SOLN
25.0000 mg | Freq: Once | INTRAMUSCULAR | Status: AC
  Administered 2018-11-24: 25 mg via INTRAVENOUS

## 2018-11-24 MED ORDER — SODIUM CHLORIDE 0.9 % IV SOLN
600.0000 mg/m2 | Freq: Once | INTRAVENOUS | Status: AC
  Administered 2018-11-24: 1140 mg via INTRAVENOUS
  Filled 2018-11-24: qty 29.98

## 2018-11-24 MED ORDER — MONTELUKAST SODIUM 10 MG PO TABS
ORAL_TABLET | ORAL | Status: AC
  Filled 2018-11-24: qty 1

## 2018-11-24 MED ORDER — MORPHINE SULFATE 4 MG/ML IJ SOLN
1.0000 mg | Freq: Once | INTRAMUSCULAR | Status: AC
  Administered 2018-11-24: 1 mg via INTRAVENOUS
  Filled 2018-11-24: qty 1

## 2018-11-24 MED ORDER — EPINEPHRINE 0.3 MG/0.3ML IJ SOAJ
0.3000 mg | Freq: Once | INTRAMUSCULAR | Status: AC
  Administered 2018-11-24: 0.3 mg via INTRAMUSCULAR
  Filled 2018-11-24: qty 0.3

## 2018-11-24 MED ORDER — PALONOSETRON HCL INJECTION 0.25 MG/5ML
0.2500 mg | Freq: Once | INTRAVENOUS | Status: AC
  Administered 2018-11-24: 0.25 mg via INTRAVENOUS

## 2018-11-24 MED ORDER — ALBUTEROL SULFATE HFA 108 (90 BASE) MCG/ACT IN AERS
1.0000 | INHALATION_SPRAY | RESPIRATORY_TRACT | Status: DC | PRN
  Administered 2018-11-24 (×2): 1 via RESPIRATORY_TRACT

## 2018-11-24 MED ORDER — HYDROCODONE-ACETAMINOPHEN 5-325 MG PO TABS: 1.0000 | ORAL_TABLET | Freq: Four times a day (QID) | ORAL | 0 refills | Status: DC | PRN

## 2018-11-24 MED ORDER — SODIUM CHLORIDE 0.9 % IV SOLN
370.0000 mg | Freq: Once | INTRAVENOUS | Status: AC
  Administered 2018-11-24: 370 mg via INTRAVENOUS
  Filled 2018-11-24: qty 37

## 2018-11-24 NOTE — Assessment & Plan Note (Signed)
The patient have chronic joint pain and arthritis She is prescribed gabapentin along with hydrocodone as needed We will continue the same and I have refilled her prescriptions

## 2018-11-24 NOTE — Assessment & Plan Note (Signed)
Overall, she tolerated chemotherapy well She had no appreciable side effects since we modified the dosing of chemo and to change the frequency of treatment to every 21 days I plan to repeat CT imaging before her next treatment We will proceed with treatment today without delay

## 2018-11-24 NOTE — Progress Notes (Signed)
[place] OFFICE PROGRESS NOTE  Patient Care Team: [name], MD as PCP - General (Hematology and Oncology)  ASSESSMENT & PLAN:  Primary peritoneal carcinomatosis ([place]) Overall, she tolerated chemotherapy well She had no appreciable side effects since we modified the dosing of chemo and to change the frequency of treatment to every 21 days I plan to repeat CT imaging before her next treatment We will proceed with treatment today without delay  Thrombocytopenia ([place]) She has severe pancytopenia secondary to recent chemotherapy and also background history of liver disease With recent reduction of treatments, hemoglobin and white counts are now normal 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 CT ABDOMEN PELVIS W CONTRAST    Standing Status:   Future    Standing Expiration Date:   11/24/2019    Order Specific Question:   If indicated for the ordered procedure, I authorize the administration of contrast media per Radiology protocol    Answer:   Yes    Order Specific Question:   Preferred imaging location?    Answer:   [facility]    Order Specific Question:   Radiology Contrast Protocol - do NOT remove file path    Answer:   [email or web address]    INTERVAL HISTORY: Please see below for problem oriented charting. 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  No worrisome liver mass.  There are associated portal venous collaterals and splenomegaly consistent with portal venous hypertension. 2.  No other significant upper abdominal findings   11/08/2013 Imaging   US showed ascites   11/08/2013 Initial Diagnosis   Patient had ~ 2 months of some urinary incontinence, saw Dr Ronita Hipps in 10-2013, with pelvic mass on exam    11/12/2013 Imaging   There are findings of progressive hepatic cirrhosis with a large amount of ascites and mild splenomegaly. There are venous collaterals present. 2. There is abnormal thickening of the peritoneal surface along the left mid and lower abdominal wall. There is abnormal soft tissue density in the pelvis as well which could reflect the clinically suspected ovarian malignancy but the findings are nondiagnostic. 3. There is a new small left pleural effusion. 4. There is no acute bowel abnormality.   11/18/2013 Imaging   Successful ultrasound guided diagnostic and therapeutic paracentesis yielding 4.1 liters of ascites.    11/18/2013 Pathology Results   PERITONEAL/ASCITIC FLUID (SPECIMEN 1 OF 1 COLLECTED 11/18/13): SEROUS CARCINOMA, PLEASE SEE COMMENT.   12/01/2013 Tumor Marker   Patient's tumor was tested for the following markers: CA125 Results of the tumor marker test revealed 1938   12/03/2013 Imaging   Successful ultrasound-guided therapeutic paracentesis yielding 3.6 liters of peritoneal fluid.   12/07/2013 - 05/30/2014 Chemotherapy  She received 3 cycles of carboplatin and Taxol, interrupted cycle 4 for surgery.  She subsequently completed 3 more cycles of chemotherapy after surgery   12/20/2013 Imaging   Successful ultrasound-guided  diagnostic and therapeutic paracentesis yielding 2.2 liters of peritoneal fluid   12/20/2013 Pathology Results   PERITONEAL/ASCITIC FLUID(SPECIMEN 1 OF 1 COLLECTED 12/20/13): MALIGNANT CELLS CONSISTENT WITH SEROUS CARCINOMA   01/13/2014 Tumor Marker   Patient's tumor was tested for the following markers: CA125 Results of the tumor marker test revealed 227   02/07/2014 Tumor Marker   Patient's tumor was tested for the following markers: CA125 Results of the tumor marker test revealed 130   02/15/2014 Genetic Testing   Genetics testing from 02-2014 normal ([employer] panel)   02/23/2014 Imaging   Interval decrease in ascites. 2. Morphologic changes in the liver consistent with cirrhosis. 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 Portal vein remains patent at this time. 3. Persistent but decreased abnormal soft tissue attenuation tracking in the omental fat. This may be secondary to interval improvement in metastatic disease. 4. Peritoneal thickening seen in the left abdomen and pelvis on the previous study has decreased and nearly resolved in the interval. This also suggests interval improvement in metastatic disease.    03/07/2014 Procedure   Ultrasound and fluoroscopically guided right internal jugular single lumen power port catheter insertion. Tip in the SVC/RA junction. Catheter ready for use.   03/17/2014 Tumor Marker   Patient's tumor was tested for the following markers: CA125 Results of the tumor marker test revealed 45   03/29/2014 Pathology Results   1. Omentum, resection for tumor - HIGH GRADE SEROUS CARCINOMA, SEE COMMENT. 2. Ovary and fallopian tube, right - HIGH GRADE SEROUS CARCINOMA, SEE COMMENT. 3. Ovary and fallopian tube, left - HIGH GRADE SEROUS CARCINOMA, SEE COMMENT. Diagnosis Note 1. Nests and clusters of malignant cells are invading the omental tissue (part #1) with associated fibrosis. The cells are pleomorphic with prominent  nucleoli. There are scattered psammoma bodies. The ovaries are atrophic and exhibit multiple foci of surface based invasive carcinoma and associated fibrosis. The fallopian tubes have a few foci of carcinoma, also superficially located. There are no precursor lesions noted in the ovaries or fallopian tubes (the entire tubes and ovaries were submitted for evaluation). While there is some retraction artifact, there are several foci suspicious for lymphovascular invasion. 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 Given the fibrosis, there does appear to be a small amount of treatment effect, however, there is abundant residual tumor.    03/29/2014 Surgery   Procedure(s) Performed: 1. Exploratory laparotomy with bilateral salpingo-oophorectomy, omentectomy radical tumor debulking for ovarian cancer .  Surgeon: Thereasa Solo, MD.  Assistant Surgeon: Lahoma Crocker, M.D. Assistant: (an MD assistant was necessary for tissue manipulation, retraction and positioning due to the complexity of the case and hospital policies).  Operative Findings: 10cm omental cake from hepatic to splenic flexure, densely adherent to transverse colon. Milliary studding of tumor implants (<10m, too numerous in number to count) adherent to the mesentery of the small bowel and small bowel wall. Small volume (100cc) ascites. Small ovaries bilaterally, densely adherent to the pelvic cul de sac. Left ovary and tube densely adherent to sigmoid colon. Cirrhotic liver with hepatomegaly and splenomegaly.     This represented an optimal cytoreduction (R1) with visible disease residual on the bowel wall and  mesentery (millial, <73m implants).     04/18/2014 Tumor Marker   Patient's tumor was tested for the following markers: CA125 Results of the tumor marker test revealed 122   05/16/2014 Tumor Marker    Patient's tumor was tested for the following markers: CA125 Results of the tumor marker test revealed 30   06/10/2014 Tumor Marker   Patient's tumor was tested for the following markers: CA125 Results of the tumor marker test revealed 19   07/04/2014 Imaging   Interval improvement in the appearance of peritoneal metastasis secondary to ovarian cancer. 2. 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    08/18/2014 Tumor Marker   Patient's tumor was tested for the following markers: CA125 Results of the tumor marker test revealed 16   10/13/2014 Tumor Marker   Patient's tumor was tested for the following markers: CA125 Results of the tumor marker test revealed 14   11/21/2014 Tumor Marker   Patient's tumor was tested for the following markers: CA125 Results of the tumor marker test revealed 16   12/28/2014 Tumor Marker   Patient's tumor was tested for the following markers: CA125 Results of the tumor marker test revealed 762   02/27/2015 Tumor Marker   Patient's tumor was tested for the following markers: CA125 Results of the tumor marker test revealed 20.2   03/22/2015 Imaging   Filler appearance to the prior exam, with very subtle fluid tracking along portions of the liver, and very minimal nodularity along the right paracolic gutter representing residua from the prior peritoneal cancer. The current abnormalities could simply be post therapy findings rather than necessarily representing residual malignancy. No new or enlarging lesions are identified. 2. Hepatic cirrhosis and splenomegaly. There is some gastric varices suggesting portal venous hypertension. 3. Left foraminal stenosis at L4-5 due to spurring. There is likely also some central narrowing of the thecal sac at this level. 4. Bibasilar scarring. 5. Chronic mild left mid kidney scarring   03/27/2015 Tumor Marker   Patient's tumor was tested for the following markers: CA125 Results of the tumor marker test revealed  25.3   04/24/2015 Imaging   Disc bulge L2-3 with mild spinal stenosis and right foraminal encroachment. 2. Disc bulge L3-4 with mild spinal stenosis. 3. Multifactorial spinal, left lateral recess and foraminal stenosis L4-5. There is grade 1 anterolisthesis without evident dynamic instability.   05/29/2015 Tumor Marker   Patient's tumor was tested for the following markers: CA125 Results of the tumor marker test revealed 29.9   08/21/2015 Tumor Marker   Patient's tumor was tested for the following markers: CA125 Results of the tumor marker test revealed 45   09/18/2015 Tumor Marker   Patient's tumor was tested for the following markers: CA125 Results of the tumor marker test revealed 47   09/27/2015 Imaging   New small bowel mesenteric nodule and enlarged left external iliac lymph node, highly worrisome for metastatic disease. 2. Cirrhosis and splenomegaly   10/10/2015 Tumor Marker   Patient's tumor was tested for the following markers: CA125 Results of the tumor marker test revealed 53.4   10/10/2015 - 12/11/2015 Chemotherapy   She received 4 cycles of carboplatin only   11/20/2015 Tumor Marker   Patient's tumor was tested for the following markers: CA125 Results of the tumor marker test revealed 41.3   12/20/2015 Imaging   CT: Mild left external iliac lymphadenopathy is slightly decreased. Mildly enlarged lower mesenteric nodule is slightly decreased. 2. No new or progressive metastatic disease in the abdomen or  pelvis. 3. Cirrhosis. Stable subcentimeter hypodense left liver lobe lesion. No new liver lesions. 4. Stable mild splenomegaly.  No ascites. 5. Mild sigmoid diverticulosis.   01/01/2016 -  Anti-estrogen oral therapy   She was placed on tamoxifen. Had letrozole initially but switched to tamoxifen due to poor tolerance   01/15/2016 Tumor Marker   Patient's tumor was tested for the following markers: CA125 Results of the tumor marker test revealed 31.4   02/19/2016 Tumor Marker    Patient's tumor was tested for the following markers: CA125 Results of the tumor marker test revealed 36.5   05/13/2016 Imaging   No evidence of disease progression within the abdomen or pelvis. Previously noted small central mesenteric nodule and left external iliac node appear slightly smaller. 2. Stable changes of hepatic cirrhosis and portal hypertension with associated splenomegaly. No new or enlarging hepatic lesions are identified. 3. No acute findings.   05/13/2016 Tumor Marker   Patient's tumor was tested for the following markers: CA125 Results of the tumor marker test revealed 41.2   06/24/2016 Tumor Marker   Patient's tumor was tested for the following markers: CA125 Results of the tumor marker test revealed 47.3   08/20/2016 Imaging   1. The left external iliac lymph node is slightly increased in size compared to the prior exam, currently 1.1 cm and previously 0.9 cm in diameter. 2. Stable central mesenteric lymph node at 0.9 cm in diameter. 3. Stable trace free pelvic fluid and stable slight thickening along the right paracolic gutter, without well-defined peritoneal nodularity. 4. Other imaging findings of potential clinical significance: Subsegmental atelectasis or scarring in the lung bases. Hepatic cirrhosis. Left renal scarring. Mild splenomegaly. Sigmoid colon diverticulosis. Impingement at L4-5 due to spondylosis and degenerative disc disease broad Schmorl' s nodes at L3-L4. Pelvic floor laxity   08/23/2016 Tumor Marker   Patient's tumor was tested for the following markers: CA125 Results of the tumor marker test revealed 80.2   09/04/2016 Imaging   LV EF: 60% -   65%   09/12/2016 Tumor Marker   Patient's tumor was tested for the following markers: CA125 Results of the tumor marker test revealed 98.5   09/12/2016 - 01/29/2018 Chemotherapy   The patient received Doxil and Avastin. Avastin was discontinued in Dec 2019 due to GI hemorrhage   09/26/2016 Tumor Marker   Patient's  tumor was tested for the following markers: CA125 Results of the tumor marker test revealed 68.9   10/10/2016 Tumor Marker   Patient's tumor was tested for the following markers: CA125 Results of the tumor marker test revealed 65.6   11/07/2016 Tumor Marker   Patient's tumor was tested for the following markers: CA125 Results of the tumor marker test revealed 46.4   11/29/2016 Imaging   Stable mild peritoneal thickening, suspicious for peritoneal carcinomatosis. No new or progressive disease identified. No evidence of ascites.  No significant change in 10 mm left external iliac and 8 mm mesenteric lymph nodes.  Stable hepatic cirrhosis, and splenomegaly consistent with portal venous hypertension.  Colonic diverticulosis, without radiographic evidence of diverticulitis.   12/02/2016 Imaging   Normal LV size with EF 55%. Basal inferior and basal inferoseptal hypokinesis. Normal RV size and systolic function. No significant valvular abnormalities.   12/05/2016 Tumor Marker   Patient's tumor was tested for the following markers: CA125 Results of the tumor marker test revealed 49.1   01/09/2017 Tumor Marker   Patient's tumor was tested for the following markers: CA125 Results of the tumor marker test  revealed 47.2   02/06/2017 Tumor Marker   Patient's tumor was tested for the following markers: CA125 Results of the tumor marker test revealed 44.1   02/18/2017 Imaging   Stable mild peritoneal thickening. No new or progressive disease identified within the abdomen or pelvis.  Stable tiny sub-cm left external iliac and mesenteric lymph nodes.  Stable hepatic cirrhosis, and splenomegaly consistent with portal venous hypertension. No evidence of hepatic neoplasm.  Colonic diverticulosis, without radiographic evidence of diverticulitis.   03/06/2017 Tumor Marker   Patient's tumor was tested for the following markers: CA125 Results of the tumor marker test revealed 50.4   03/17/2017  Imaging   - Left ventricle: The cavity size was normal. Wall thickness was normal. Systolic function was normal. The estimated ejection fraction was in the range of 55% to 60%. Wall motion was normal; there were no regional wall motion abnormalities. Features are consistent with a pseudonormal left ventricular filling pattern, with concomitant abnormal relaxation and increased filling pressure (grade 2 diastolic dysfunction). - Mitral valve: There was mild regurgitation. - Left atrium: The atrium was mildly dilated   04/03/2017 Tumor Marker   Patient's tumor was tested for the following markers: CA125 Results of the tumor marker test revealed 47.2   05/14/2017 Imaging   CT scan of abdomen and pelvis Stable mild peritoneal thickening. No new or progressive disease within the abdomen or pelvis.  Stable hepatic cirrhosis. Stable splenomegaly, consistent with portal venous hypertension. No evidence of hepatic neoplasm.   05/15/2017 Tumor Marker   Patient's tumor was tested for the following markers: CA125 Results of the tumor marker test revealed 56.1   06/12/2017 Tumor Marker   Patient's tumor was tested for the following markers: CA125 Results of the tumor marker test revealed 49.2   07/10/2017 Tumor Marker   Patient's tumor was tested for the following markers: CA125 Results of the tumor marker test revealed 46.3   08/07/2017 Tumor Marker   Patient's tumor was tested for the following markers: CA125 Results of the tumor marker test revealed 52.9   08/20/2017 Imaging   1. Mild omental/peritoneal haziness, unchanged. No evidence of new metastatic disease. 2. Cirrhosis with splenomegaly.   09/04/2017 Tumor Marker   Patient's tumor was tested for the following markers: CA125 Results of the tumor marker test revealed 48.5   11/13/2017 Tumor Marker   Patient's tumor was tested for the following markers: CA125 Results of the tumor marker test revealed 55.8   12/17/2017 Imaging   1. No findings  to suggest metastatic disease in the abdomen or pelvis. 2. Severe hepatic cirrhosis with evidence of portal hypertension, as demonstrated by dilated portal vein, splenomegaly and portosystemic collateral pathways, including gastric and esophageal varices.  3. Colonic diverticulosis without evidence of acute diverticulitis at this time. 4. Additional incidental findings, as above.   01/16/2018 Tumor Marker   Patient's tumor was tested for the following markers: CA125 Results of the tumor marker test revealed 63.7   02/10/2018 Procedure   EGD - Recently bleeding grade III and large (> 5 mm) esophageal varices. Completely eradicated. Banded. - Portal hypertensive gastropathy. - Normal examined duodenum. - No specimens collected   02/13/2018 Tumor Marker   Patient's tumor was tested for the following markers: CA125 Results of the tumor marker test revealed 82.7   02/25/2018 Imaging   Bone density showed mild osteopenia   04/08/2018 Tumor Marker   Patient's tumor was tested for the following markers: CA125 Results of the tumor marker test revealed 82.7  04/08/2018 Imaging   1. No definite omental or peritoneal surface lesions. However, there are 2 slowly enlarging lymph nodes noted. One is in the small bowel mesentery and the other is in the left deep pelvis. Could not exclude recurrent disease. 2. Stable advanced cirrhotic changes involving the liver with portal venous hypertension, portal venous collaterals, esophageal varices and splenomegaly. No worrisome hepatic lesions. 3. Diffuse wall thickening of the colon could suggest diffuse colitis or could be due to low albumin. Recommend correlation with any symptoms such as diarrhea.   05/21/2018 Tumor Marker   Patient's tumor was tested for the following markers: CA125 Results of the tumor marker test revealed 86   09/07/2018 Tumor Marker   Patient's tumor was tested for the following markers:CA-125 Results of the tumor marker test  revealed 111   09/29/2018 Tumor Marker   Patient's tumor was tested for the following markers: CA-125 Results of the tumor marker test revealed 121.   09/29/2018 -  Chemotherapy   The patient had carboplatin and gemzar for chemotherapy treatment.     10/27/2018 Tumor Marker   Patient's tumor was tested for the following markers: CA-125 Results of the tumor marker test revealed 99.9     REVIEW OF SYSTEMS:   Constitutional: Denies fevers, chills or abnormal weight loss Eyes: Denies blurriness of vision Ears, nose, mouth, throat, and face: Denies mucositis or sore throat Respiratory: Denies cough, dyspnea or wheezes Cardiovascular: Denies palpitation, chest discomfort or lower extremity swelling Gastrointestinal:  Denies nausea, heartburn or change in bowel habits Skin: Denies abnormal skin rashes Lymphatics: Denies new lymphadenopathy or easy bruising Neurological:Denies numbness, tingling or new weaknesses Behavioral/Psych: Mood is stable, no new changes  All other systems were reviewed with the patient and are negative.  I have reviewed the past medical history, past surgical history, social history and family history with the patient and they are unchanged from previous note.  ALLERGIES:  is allergic to shellfish allergy; benadryl [diphenhydramine hcl]; penicillins; and tape.  MEDICATIONS:  Current Outpatient Medications  Medication Sig Dispense Refill  . empagliflozin (JARDIANCE) 10 MG TABS tablet Take 10 mg by mouth daily.    . valsartan (DIOVAN) 40 MG tablet Take 40 mg by mouth daily.    . cholecalciferol (VITAMIN D) 1000 units tablet Take 1,000 Units by mouth at bedtime.     . gabapentin (NEURONTIN) 300 MG capsule TAKE 2 CAPSULES(600 MG) BY MOUTH TWICE DAILY (Patient taking differently: Take 600 mg by mouth 2 (two) times daily. ) 120 capsule 11  . hydrochlorothiazide (MICROZIDE) 12.5 MG capsule Take 12.5 mg by mouth daily after breakfast.   1  . HYDROcodone-acetaminophen  (NORCO/VICODIN) 5-325 MG tablet Take 1 tablet by mouth every 6 (six) hours as needed for moderate pain. 30 tablet 0  . insulin degludec (TRESIBA FLEXTOUCH) 100 UNIT/ML SOPN FlexTouch Pen Inject 32 Units into the skin daily after breakfast.    . lidocaine-prilocaine (EMLA) cream Apply to Porta-cath  1-2 hours prior to access as directed. (Patient taking differently: Apply 1 application topically daily as needed (port access). ) 30 g 9  . nadolol (CORGARD) 20 MG tablet Take 1 tablet (20 mg total) by mouth daily. (Patient taking differently: Take 20 mg by mouth at bedtime. ) 90 tablet 3  . ondansetron (ZOFRAN) 8 MG tablet Take 1 tablet (8 mg total) by mouth every 8 (eight) hours as needed. Start on the third day after chemotherapy. 30 tablet 1  . pantoprazole (PROTONIX) 40 MG tablet Take 1 tablet (  40 mg total) by mouth daily.    . prochlorperazine (COMPAZINE) 10 MG tablet Take 1 tablet (10 mg total) by mouth every 6 (six) hours as needed (Nausea or vomiting). 30 tablet 1  . rotigotine (NEUPRO) 4 MG/24HR APPLY 1 PATCH EXTERNALLY TO THE SKIN DAILY (Patient taking differently: Place 1 patch onto the skin daily. ) 30 patch 5   No current facility-administered medications for this visit.     PHYSICAL EXAMINATION: ECOG PERFORMANCE STATUS: 1 - Symptomatic but completely ambulatory  Vitals:   11/24/18 0844  BP: (!) 139/53  Pulse: 60  Resp: 18  Temp: 98.3 F (36.8 C)  SpO2: 99%   Filed Weights   11/24/18 0844  Weight: 186 lb 11.2 oz (84.7 kg)    GENERAL:alert, no distress and comfortable SKIN: skin color, texture, turgor are normal, no rashes or significant lesions EYES: normal, Conjunctiva are pink and non-injected, sclera clear OROPHARYNX:no exudate, no erythema and lips, buccal mucosa, and tongue normal  NECK: supple, thyroid normal size, non-tender, without nodularity LYMPH:  no palpable lymphadenopathy in the cervical, axillary or inguinal LUNGS: clear to auscultation and percussion with  normal breathing effort HEART: regular rate & rhythm and no murmurs and no lower extremity edema ABDOMEN:abdomen soft, non-tender and normal bowel sounds Musculoskeletal:no cyanosis of digits and no clubbing  NEURO: alert & oriented x 3 with fluent speech, no focal motor/sensory deficits  LABORATORY DATA:  I have reviewed the data as listed    Component Value Date/Time   NA 143 11/24/2018 0800   NA 142 02/06/2017 0742   K 4.4 11/24/2018 0800   K 3.9 02/06/2017 0742   CL 108 11/24/2018 0800   CO2 24 11/24/2018 0800   CO2 22 02/06/2017 0742   GLUCOSE 159 (H) 11/24/2018 0800   GLUCOSE 156 (H) 02/06/2017 0742   BUN 24 (H) 11/24/2018 0800   BUN 13.5 02/06/2017 0742   CREATININE 0.99 11/24/2018 0800   CREATININE 0.8 02/06/2017 0742   CALCIUM 9.5 11/24/2018 0800   CALCIUM 8.5 02/06/2017 0742   PROT 8.1 11/24/2018 0800   PROT 6.9 02/06/2017 0742   ALBUMIN 4.0 11/24/2018 0800   ALBUMIN 3.7 02/06/2017 0742   AST 37 11/24/2018 0800   AST 47 (H) 02/06/2017 0742   ALT 37 11/24/2018 0800   ALT 54 02/06/2017 0742   ALKPHOS 144 (H) 11/24/2018 0800   ALKPHOS 130 02/06/2017 0742   BILITOT 0.8 11/24/2018 0800   BILITOT 0.65 02/06/2017 0742   GFRNONAA 57 (L) 11/24/2018 0800   GFRAA >60 11/24/2018 0800    No results found for: SPEP, UPEP  Lab Results  Component Value Date   WBC 4.4 11/24/2018   NEUTROABS 2.8 11/24/2018   HGB 12.1 11/24/2018   HCT 36.5 11/24/2018   MCV 92.9 11/24/2018   PLT 82 (L) 11/24/2018      Chemistry      Component Value Date/Time   NA 143 11/24/2018 0800   NA 142 02/06/2017 0742   K 4.4 11/24/2018 0800   K 3.9 02/06/2017 0742   CL 108 11/24/2018 0800   CO2 24 11/24/2018 0800   CO2 22 02/06/2017 0742   BUN 24 (H) 11/24/2018 0800   BUN 13.5 02/06/2017 0742   CREATININE 0.99 11/24/2018 0800   CREATININE 0.8 02/06/2017 0742      Component Value Date/Time   CALCIUM 9.5 11/24/2018 0800   CALCIUM 8.5 02/06/2017 0742   ALKPHOS 144 (H) 11/24/2018 0800    ALKPHOS 130 02/06/2017 0742  AST 37 11/24/2018 0800   AST 47 (H) 02/06/2017 0742   ALT 37 11/24/2018 0800   ALT 54 02/06/2017 0742   BILITOT 0.8 11/24/2018 0800   BILITOT 0.65 02/06/2017 0742       All questions were answered. The patient knows to call the clinic with any problems, questions or concerns. No barriers to learning was detected.  I spent 15 minutes counseling the patient face to face. The total time spent in the appointment was 20 minutes and more than 50% was on counseling and review of test results  [name], MD 11/24/2018 9:14 AM

## 2018-11-24 NOTE — Patient Instructions (Signed)
[facility] Discharge Instructions for Patients Receiving Chemotherapy  Today you received the following chemotherapy agents: Gemzar, Carboplatin  To help prevent nausea and vomiting after your treatment, we encourage you to take your nausea medication as directed.   If you develop nausea and vomiting that is not controlled by your nausea medication, call the clinic.   BELOW ARE SYMPTOMS THAT SHOULD BE REPORTED IMMEDIATELY:  *FEVER GREATER THAN 100.5 F  *CHILLS WITH OR WITHOUT FEVER  NAUSEA AND VOMITING THAT IS NOT CONTROLLED WITH YOUR NAUSEA MEDICATION  *UNUSUAL SHORTNESS OF BREATH  *UNUSUAL BRUISING OR BLEEDING  TENDERNESS IN MOUTH AND THROAT WITH OR WITHOUT PRESENCE OF ULCERS  *URINARY PROBLEMS  *BOWEL PROBLEMS  UNUSUAL RASH Items with * indicate a potential emergency and should be followed up as soon as possible.  Feel free to call the clinic should you have any questions or concerns. The clinic phone number is [phone].  Please show the [place] at check-in to the Emergency Department and triage nurse.

## 2018-11-24 NOTE — Progress Notes (Signed)
DATE:  11/24/2018                                         X  CHEMO/IMMUNOTHERAPY REACTION            MD:  Dr. [name]    AGENT/BLOOD PRODUCT RECEIVING TODAY:               Carboplatin and gemcitabine   AGENT/BLOOD PRODUCT RECEIVING IMMEDIATELY PRIOR TO REACTION:           Carboplatin   VS: BP:      128/58   P:        73       SPO2:        99% on oxygen at 2 L/min                BP:      171/53   P:        86       SPO2:        96% on room air   BP:      156/58   P:        85       SPO2:        96% on room air    REACTION(S):           Cough, shortness of breath, chest tightness, erythema, itching, and nausea   PREMEDS:      Aloxi, Emend, and Pepcid   INTERVENTION: Solu-Medrol 125 mg IV x1, Benadryl 25 mg IV x1, albuterol inhaler 2 puffs x 1, epinephrine 0.5 mg subcu x1, Pepcid 20 mg IV x1, albuterol inhaler 1 puff x 1, morphine sulfate 1 mg IV x1, and Singulair 10 mg p.o. x1   Review of Systems  Review of Systems  Constitutional: Negative for chills, diaphoresis and fever.  HENT: Negative for trouble swallowing and voice change.   Respiratory: Positive for cough and shortness of breath. Negative for chest tightness and wheezing.   Cardiovascular: Negative for chest pain and palpitations.       Chest tightness  Gastrointestinal: Positive for nausea. Negative for abdominal pain, constipation, diarrhea and vomiting.  Musculoskeletal: Negative for back pain and myalgias.  Skin: Positive for color change (Erythema of the hands, arms, chest, neck, and face).       Pruritus  Neurological: Negative for dizziness, light-headedness and headaches.     Physical Exam  Physical Exam Constitutional:      General: She is in acute distress.     Appearance: She is not diaphoretic.  HENT:     Head: Normocephalic and atraumatic.  Cardiovascular:     Rate and Rhythm: Normal rate and regular rhythm.     Heart sounds: Normal heart sounds. No murmur. No friction rub. No gallop.    Pulmonary:     Effort: Pulmonary effort is normal. No respiratory distress.     Breath sounds: Normal breath sounds. No wheezing or rales.  Skin:    General: Skin is warm and dry.     Findings: Erythema (Erythema of the hands, arms, chest, neck, and face) present. No rash.     OUTCOME:                 Carboplatin was stopped.  The patient was dosed with medications as noted above.  Her symptoms initially resolved.  She was observed for 30 minutes and then was discharged home with only some mild erythema noted in her bilateral dorsal forearms.  She was instructed to take Zyrtec or Allegra later this afternoon.  Carboplatin has been added to her allergy list.  She will return to see Dr. [name] on 12/25/2018 for review of her restaging CT scans and for discussion of management options.  This case was discussed with Dr. Alvy Bimler. She expresses agreement with my management of this patient.   Sandi Mealy, MHS, PA-C

## 2018-11-24 NOTE — Progress Notes (Signed)
About 10 minutes after Carboplatin was administered the patient c/o feeling "hot and itchy" and she was flushed, also c/o SOB and CO and placed on 2LO2. Sandi Mealy PA with [facility] called and at chair-side The Carboplatin was stopped at 11:58, NS was hung to drip, solu-medrol, benadryl, Proair inhaler, epinephrine, morphine, pepcid, and Singulair were all administered - please see MAR for specific times. EKG performed at 12:12. VS were checked periodically until stable - please see flowsheet for specific documentation. Patient was discharged at 13:00 after follow up and verbal received from [facility] PA. Patient escorted via wheelchair to front lobby and [place] NT remained with patient until hand off to family. Patient aware to contact [facility] with any other concerns and to go to the ED if any further SOB, CP. Patient verbalized understanding.

## 2018-11-24 NOTE — Assessment & Plan Note (Signed)
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

## 2018-11-24 NOTE — Telephone Encounter (Signed)
I left a message regarding schedule  

## 2018-11-25 ENCOUNTER — Telehealth: Payer: Self-pay

## 2018-11-25 LAB — CA 125: Cancer Antigen (CA) 125: 81.2 U/mL — ABNORMAL HIGH (ref 0.0–38.1)

## 2018-11-25 NOTE — Telephone Encounter (Signed)
-----   Message from [name], MD sent at 11/25/2018  8:37 AM EDT ----- Regarding: she had allergic rxn yesterday. can you call and ask how is she? also, tell her CA-125 is better

## 2018-11-25 NOTE — Telephone Encounter (Signed)
Called and given below message. She verbalized understanding. She is doing good today. She is going to take it easy and just stay in the house. She scratched so much yesterday from the hives to her arms/ breasts that she has petechiae to both areas. Instructed to call the office if needed. She verbalized understanding.

## 2018-12-14 ENCOUNTER — Ambulatory Visit (HOSPITAL_COMMUNITY)
Admission: RE | Admit: 2018-12-14 | Discharge: 2018-12-14 | Disposition: A | Discharge status: Home / Self Care | Payer: Medicare Other | Source: Ambulatory Visit | Attending: Hematology and Oncology | Admitting: Hematology and Oncology

## 2018-12-14 ENCOUNTER — Encounter (HOSPITAL_COMMUNITY): Payer: Self-pay

## 2018-12-14 ENCOUNTER — Other Ambulatory Visit: Payer: Self-pay

## 2018-12-14 DIAGNOSIS — C482 Malignant neoplasm of peritoneum, unspecified: Secondary | ICD-10-CM

## 2018-12-14 MED ORDER — SODIUM CHLORIDE (PF) 0.9 % IJ SOLN
INTRAMUSCULAR | Status: AC
  Filled 2018-12-14: qty 50

## 2018-12-14 MED ORDER — HEPARIN SOD (PORK) LOCK FLUSH 100 UNIT/ML IV SOLN
INTRAVENOUS | Status: AC
  Administered 2018-12-14: 500 [IU] via INTRAVENOUS
  Filled 2018-12-14: qty 5

## 2018-12-14 MED ORDER — IOHEXOL 300 MG/ML  SOLN
100.0000 mL | Freq: Once | INTRAMUSCULAR | Status: AC | PRN
  Administered 2018-12-14: 100 mL via INTRAVENOUS

## 2018-12-14 MED ORDER — IOHEXOL 300 MG/ML  SOLN
30.0000 mL | Freq: Once | INTRAMUSCULAR | Status: AC | PRN
  Administered 2018-12-14: 30 mL via ORAL

## 2018-12-14 MED ORDER — HEPARIN SOD (PORK) LOCK FLUSH 100 UNIT/ML IV SOLN
500.0000 [IU] | Freq: Once | INTRAVENOUS | Status: AC
  Administered 2018-12-14: 13:00:00 500 [IU] via INTRAVENOUS

## 2018-12-15 ENCOUNTER — Other Ambulatory Visit: Payer: Self-pay

## 2018-12-15 ENCOUNTER — Encounter: Payer: Self-pay | Admitting: Hematology and Oncology

## 2018-12-15 ENCOUNTER — Inpatient Hospital Stay: Payer: Medicare Other

## 2018-12-15 ENCOUNTER — Other Ambulatory Visit: Payer: Self-pay | Admitting: Hematology and Oncology

## 2018-12-15 ENCOUNTER — Inpatient Hospital Stay: Payer: Medicare Other | Attending: Hematology and Oncology | Admitting: Hematology and Oncology

## 2018-12-15 ENCOUNTER — Telehealth: Payer: Self-pay | Admitting: Hematology and Oncology

## 2018-12-15 ENCOUNTER — Telehealth: Payer: Self-pay

## 2018-12-15 VITALS — BP 119/51 | HR 65 | Temp 98.0°F | Resp 16 | Ht 61.0 in | Wt 190.5 lb

## 2018-12-15 DIAGNOSIS — C482 Malignant neoplasm of peritoneum, unspecified: Secondary | ICD-10-CM

## 2018-12-15 DIAGNOSIS — D6959 Other secondary thrombocytopenia: Secondary | ICD-10-CM | POA: Insufficient documentation

## 2018-12-15 DIAGNOSIS — E1165 Type 2 diabetes mellitus with hyperglycemia: Secondary | ICD-10-CM | POA: Diagnosis not present

## 2018-12-15 DIAGNOSIS — E1140 Type 2 diabetes mellitus with diabetic neuropathy, unspecified: Secondary | ICD-10-CM

## 2018-12-15 DIAGNOSIS — Z79899 Other long term (current) drug therapy: Secondary | ICD-10-CM | POA: Diagnosis not present

## 2018-12-15 DIAGNOSIS — D696 Thrombocytopenia, unspecified: Secondary | ICD-10-CM | POA: Diagnosis not present

## 2018-12-15 DIAGNOSIS — Z794 Long term (current) use of insulin: Secondary | ICD-10-CM | POA: Insufficient documentation

## 2018-12-15 DIAGNOSIS — I700 Atherosclerosis of aorta: Secondary | ICD-10-CM | POA: Insufficient documentation

## 2018-12-15 DIAGNOSIS — Z5111 Encounter for antineoplastic chemotherapy: Secondary | ICD-10-CM | POA: Diagnosis not present

## 2018-12-15 DIAGNOSIS — K7460 Unspecified cirrhosis of liver: Secondary | ICD-10-CM | POA: Insufficient documentation

## 2018-12-15 DIAGNOSIS — Z7189 Other specified counseling: Secondary | ICD-10-CM | POA: Diagnosis not present

## 2018-12-15 DIAGNOSIS — R161 Splenomegaly, not elsewhere classified: Secondary | ICD-10-CM | POA: Diagnosis not present

## 2018-12-15 MED ORDER — HYDROCODONE-ACETAMINOPHEN 5-325 MG PO TABS: 1.0000 | ORAL_TABLET | Freq: Four times a day (QID) | ORAL | 0 refills | Status: DC | PRN

## 2018-12-15 MED ORDER — DEXAMETHASONE 4 MG PO TABS: ORAL_TABLET | ORAL | 1 refills | Status: DC

## 2018-12-15 NOTE — Assessment & Plan Note (Signed)
We discussed the risk of poorly controlled diabetes and severe hyperglycemia with steroid therapy that is given as premedication before chemotherapy I have advised her to follow closely with her primary care doctor for medical management

## 2018-12-15 NOTE — Telephone Encounter (Signed)
Called and given below message. She verbalized understanding. Requested CT report be mailed to her. Verified address and will mail report.

## 2018-12-15 NOTE — Progress Notes (Signed)
DISCONTINUE OFF PATHWAY REGIMEN - Other   [id number]:Carboplatin AUC=4 D1 + Gemcitabine 800 mg/m2 D1, 8 q21 Days:   A cycle is every 21 days:     Gemcitabine      Carboplatin   **Always confirm dose/schedule in your pharmacy ordering system**  REASON: Toxicities / Adverse Event PRIOR TREATMENT: Carboplatin AUC=4 D1 + Gemcitabine 800 mg/m2 D1, 8 q21 Days TREATMENT RESPONSE: Stable Disease (SD)  START OFF PATHWAY REGIMEN - Other   [id number]:Docetaxel 75 mg/m2:   A cycle is every 21 days:     Docetaxel   **Always confirm dose/schedule in your pharmacy ordering system**  Patient Characteristics: Intent of Therapy: Non-Curative / Palliative Intent, Discussed with Patient

## 2018-12-15 NOTE — Assessment & Plan Note (Signed)
We had numerous discussions about goals of care in the past She is aware that her disease is not curable

## 2018-12-15 NOTE — Progress Notes (Signed)
[place] OFFICE PROGRESS NOTE  Patient Care Team: Gregor Hams, FNP as PCP - General (Family Medicine)  ASSESSMENT & PLAN:  Primary peritoneal carcinomatosis [facility]) I have reviewed CT imaging with the patient and her husband Radiologist made an addendum to her CT imaging Overall, she has positive response to therapy However, due to allergic reaction, we are not able to proceed with more treatment We discussed alternative treatment options by reviewing the current NCCN guidelines We discussed the risk and benefits of Taxotere versus gemcitabine as maintenance therapy and she elected for Taxotere  I reviewed the current guidelines with her This is an FDA approved drug Goals of treatment is palliative  Gynecol Oncol. 2003 Feb;88(2):130-5. 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 Rose PG1, Blessing JA, Ko Olina HG, [place], [place] D, [place] [facility] [place]. Abstract OBJECTIVES:  Docetaxel is an inhibitor of microtubule depolymerization and has demonstrated activity in paclitaxel-resistant breast cancer and gynecologic cancer. The Gynecologic Oncology Group (GOG) conducted a study of docetaxel in paclitaxel-resistant ovarian and peritoneal carcinoma to determine its activity, and nature and degree of toxicity, in this cohort of patients. METHODS:  Patients with platinum- and paclitaxel-resistant ovarian or peritoneal carcinoma, defined as progression while on or within 6 months of therapy, were eligible if they had measurable disease and had not received more than one chemotherapy regimen. Docetaxel at a dose of 100 mg/m(2) was administered iv over 1 h every 21 days. A prophylactic regimen of oral dexamethasone 8 mg bid was begun 24 h before docetaxel administration and continued for 48 h thereafter. Hepatic function was strictly monitored. RESULTS:  Sixty patients were entered and treated with  a total of 256 courses, with all 60 evaluable for toxicity and 58 evaluable for response. Responses were observed in 22.4% of patients, with 5.2% achieving complete response and 17.2% achieving partial response (95% CI, 12.5-35.3%). The median duration of response was 2.5 months. The likelihood of observing a response did not appear to be related to the length of the prior paclitaxel-free interval or duration of prior paclitaxel infusions. The principal adverse effect of grade 4 neutropenia occurred in 75% of patients. There was one treatment-related death. Dose reductions were required in 36% of patients. CONCLUSIONS:  Docetaxel is active in paclitaxel-resistant ovarian and peritoneal cancer but, in view of significant hematologic toxicity, further study is warranted to ascertain its optimal dose and schedule.  We discussed the role of chemotherapy. The intent is for palliative.  We discussed some of the risks, benefits, side-effects of Docetaxel.   Some of the short term side-effects included, though not limited to, risk of severe allergic reaction, fatigue, weight loss, pancytopenia, life-threatening infections, need for transfusions of blood products, nausea, vomiting, change in bowel habits, loss of hair, admission to hospital for various reasons, and risks of death.   Long term side-effects are also discussed including risks of infertility, permanent damage to nerve function, chronic fatigue, and rare secondary malignancy including bone marrow disorders.   The patient is aware that the response rates discussed earlier is not guaranteed.   After a long discussion, patient made an informed decision to proceed with the prescribed plan of care.   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 We will proceed with treatment as long as her platelet count is well above 75,000 I plan to reduce dose of chemotherapy upfront  Diabetes mellitus with diabetic neuropathy, with long-term current use of insulin ([place]) We discussed the risk of poorly controlled diabetes and severe hyperglycemia with steroid therapy that is given as premedication before chemotherapy I have advised her to follow closely with her primary care doctor for medical management  Goals of care, counseling/discussion We had numerous discussions about goals of care in the past She is aware that her disease is not curable   No orders of the defined types were placed in this encounter.   INTERVAL HISTORY: Please see below for problem oriented charting. 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 Had allergic reaction to carboplatin. Avastin was stopped due to GI hemorrhage   Primary peritoneal carcinomatosis ([place])  09/06/2011 Imaging   CT findings consistent with cirrhotic changes involving the liver.  No worrisome liver mass.  There are associated portal venous collaterals and splenomegaly consistent with portal venous hypertension. 2.  No other significant upper abdominal findings   11/08/2013 Imaging   US showed ascites   11/08/2013 Initial Diagnosis   Patient had ~ 2 months of some urinary incontinence, saw Dr Ronita Hipps in  10-2013, with pelvic mass on exam    11/12/2013 Imaging   There are findings of progressive hepatic cirrhosis with a large amount of ascites and mild splenomegaly. There are venous collaterals present. 2. There is abnormal thickening of the peritoneal surface along the left mid and lower abdominal wall. There is abnormal soft tissue density in the pelvis as well which could reflect the clinically suspected ovarian malignancy but the findings are nondiagnostic. 3. There is a new small left pleural effusion. 4. There is no acute bowel abnormality.   11/18/2013 Imaging   Successful ultrasound guided diagnostic and therapeutic paracentesis yielding 4.1 liters of ascites.    11/18/2013 Pathology Results   PERITONEAL/ASCITIC FLUID (SPECIMEN 1 OF 1 COLLECTED 11/18/13): SEROUS CARCINOMA, PLEASE SEE COMMENT.   12/01/2013 Tumor Marker   Patient's tumor was tested for the following markers: CA125 Results of the tumor marker test revealed 1938   12/03/2013 Imaging   Successful ultrasound-guided therapeutic paracentesis yielding 3.6 liters of peritoneal fluid.   12/07/2013 - 05/30/2014 Chemotherapy   She received 3 cycles of carboplatin and Taxol, interrupted cycle 4 for surgery.  She subsequently completed 3 more cycles of chemotherapy after surgery   12/20/2013 Imaging   Successful ultrasound-guided diagnostic and therapeutic paracentesis yielding 2.2 liters of peritoneal fluid   12/20/2013 Pathology Results   PERITONEAL/ASCITIC FLUID(SPECIMEN 1 OF 1 COLLECTED 12/20/13): MALIGNANT CELLS CONSISTENT WITH SEROUS CARCINOMA   01/13/2014 Tumor Marker   Patient's tumor was tested for the following markers: CA125 Results of the tumor marker test revealed 227   02/07/2014 Tumor Marker   Patient's tumor was tested for the following markers: CA125 Results of the tumor marker test revealed 130   02/15/2014 Genetic  Testing   Genetics testing from 02-2014 normal ([employer] panel)   02/23/2014 Imaging   Interval  decrease in ascites. 2. Morphologic changes in the liver consistent with cirrhosis. 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 Portal vein remains patent at this time. 3. Persistent but decreased abnormal soft tissue attenuation tracking in the omental fat. This may be secondary to interval improvement in metastatic disease. 4. Peritoneal thickening seen in the left abdomen and pelvis on the previous study has decreased and nearly resolved in the interval. This also suggests interval improvement in metastatic disease.    03/07/2014 Procedure   Ultrasound and fluoroscopically guided right internal jugular single lumen power port catheter insertion. Tip in the SVC/RA junction. Catheter ready for use.   03/17/2014 Tumor Marker   Patient's tumor was tested for the following markers: CA125 Results of the tumor marker test revealed 45   03/29/2014 Pathology Results   1. Omentum, resection for tumor - HIGH GRADE SEROUS CARCINOMA, SEE COMMENT. 2. Ovary and fallopian tube, right - HIGH GRADE SEROUS CARCINOMA, SEE COMMENT. 3. Ovary and fallopian tube, left - HIGH GRADE SEROUS CARCINOMA, SEE COMMENT. Diagnosis Note 1. Nests and clusters of malignant cells are invading the omental tissue (part #1) with associated fibrosis. The cells are pleomorphic with prominent nucleoli. There are scattered psammoma bodies. The ovaries are atrophic and exhibit multiple foci of surface based invasive carcinoma and associated fibrosis. The fallopian tubes have a few foci of carcinoma, also superficially located. There are no precursor lesions noted in the ovaries or fallopian tubes (the entire tubes and ovaries were submitted for evaluation). While there is some retraction artifact, there are several foci suspicious for lymphovascular invasion. 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 Given the fibrosis, there does appear to be a small amount of treatment effect, however, there is abundant residual tumor.    03/29/2014 Surgery   Procedure(s) Performed: 1. Exploratory laparotomy with bilateral salpingo-oophorectomy, omentectomy radical tumor debulking for ovarian cancer .  Surgeon: Thereasa Solo, MD.  Assistant Surgeon: Lahoma Crocker, M.D. Assistant: (an MD assistant was necessary for tissue manipulation, retraction and positioning due to the complexity of the case and hospital policies).  Operative Findings: 10cm omental cake from hepatic to splenic flexure, densely adherent to transverse colon. Milliary studding of tumor implants (<41m, too numerous in number to count) adherent to the mesentery of the small bowel and small bowel wall. Small volume (100cc) ascites. Small ovaries bilaterally, densely adherent to the pelvic cul de sac. Left ovary and tube densely adherent to sigmoid colon. Cirrhotic liver with hepatomegaly and splenomegaly.     This represented an optimal cytoreduction (R1) with visible disease residual on the bowel wall and mesentery (millial, <334mimplants).     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 2. 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    08/18/2014 Tumor Marker  Patient's tumor was tested for the following markers: CA125 Results of the tumor marker test revealed 16   10/13/2014 Tumor Marker   Patient's tumor was tested for  the following markers: CA125 Results of the tumor marker test revealed 14   11/21/2014 Tumor Marker   Patient's tumor was tested for the following markers: CA125 Results of the tumor marker test revealed 16   12/28/2014 Tumor Marker   Patient's tumor was tested for the following markers: CA125 Results of the tumor marker test revealed 762   02/27/2015 Tumor Marker   Patient's tumor was tested for the following markers: CA125 Results of the tumor marker test revealed 20.2   03/22/2015 Imaging   Filler appearance to the prior exam, with very subtle fluid tracking along portions of the liver, and very minimal nodularity along the right paracolic gutter representing residua from the prior peritoneal cancer. The current abnormalities could simply be post therapy findings rather than necessarily representing residual malignancy. No new or enlarging lesions are identified. 2. Hepatic cirrhosis and splenomegaly. There is some gastric varices suggesting portal venous hypertension. 3. Left foraminal stenosis at L4-5 due to spurring. There is likely also some central narrowing of the thecal sac at this level. 4. Bibasilar scarring. 5. Chronic mild left mid kidney scarring   03/27/2015 Tumor Marker   Patient's tumor was tested for the following markers: CA125 Results of the tumor marker test revealed 25.3   04/24/2015 Imaging   Disc bulge L2-3 with mild spinal stenosis and right foraminal encroachment. 2. Disc bulge L3-4 with mild spinal stenosis. 3. Multifactorial spinal, left lateral recess and foraminal stenosis L4-5. There is grade 1 anterolisthesis without evident dynamic instability.   05/29/2015 Tumor Marker   Patient's tumor was tested for the following markers: CA125 Results of the tumor marker test revealed 29.9   08/21/2015 Tumor Marker   Patient's tumor was tested for the following markers: CA125 Results of the tumor marker test revealed 45   09/18/2015 Tumor Marker   Patient's tumor was  tested for the following markers: CA125 Results of the tumor marker test revealed 47   09/27/2015 Imaging   New small bowel mesenteric nodule and enlarged left external iliac lymph node, highly worrisome for metastatic disease. 2. Cirrhosis and splenomegaly   10/10/2015 Tumor Marker   Patient's tumor was tested for the following markers: CA125 Results of the tumor marker test revealed 53.4   10/10/2015 - 12/11/2015 Chemotherapy   She received 4 cycles of carboplatin only   11/20/2015 Tumor Marker   Patient's tumor was tested for the following markers: CA125 Results of the tumor marker test revealed 41.3   12/20/2015 Imaging   CT: Mild left external iliac lymphadenopathy is slightly decreased. Mildly enlarged lower mesenteric nodule is slightly decreased. 2. No new or progressive metastatic disease in the abdomen or pelvis. 3. Cirrhosis. Stable subcentimeter hypodense left liver lobe lesion. No new liver lesions. 4. Stable mild splenomegaly.  No ascites. 5. Mild sigmoid diverticulosis.   01/01/2016 -  Anti-estrogen oral therapy   She was placed on tamoxifen. Had letrozole initially but switched to tamoxifen due to poor tolerance   01/15/2016 Tumor Marker   Patient's tumor was tested for the following markers: CA125 Results of the tumor marker test revealed 31.4   02/19/2016 Tumor Marker   Patient's tumor was tested for the following markers: CA125 Results of the tumor marker test revealed 36.5   05/13/2016 Imaging   No evidence of disease progression within the abdomen or  pelvis. Previously noted small central mesenteric nodule and left external iliac node appear slightly smaller. 2. Stable changes of hepatic cirrhosis and portal hypertension with associated splenomegaly. No new or enlarging hepatic lesions are identified. 3. No acute findings.   05/13/2016 Tumor Marker   Patient's tumor was tested for the following markers: CA125 Results of the tumor marker test revealed 41.2   06/24/2016  Tumor Marker   Patient's tumor was tested for the following markers: CA125 Results of the tumor marker test revealed 47.3   08/20/2016 Imaging   1. The left external iliac lymph node is slightly increased in size compared to the prior exam, currently 1.1 cm and previously 0.9 cm in diameter. 2. Stable central mesenteric lymph node at 0.9 cm in diameter. 3. Stable trace free pelvic fluid and stable slight thickening along the right paracolic gutter, without well-defined peritoneal nodularity. 4. Other imaging findings of potential clinical significance: Subsegmental atelectasis or scarring in the lung bases. Hepatic cirrhosis. Left renal scarring. Mild splenomegaly. Sigmoid colon diverticulosis. Impingement at L4-5 due to spondylosis and degenerative disc disease broad Schmorl' s nodes at L3-L4. Pelvic floor laxity   08/23/2016 Tumor Marker   Patient's tumor was tested for the following markers: CA125 Results of the tumor marker test revealed 80.2   09/04/2016 Imaging   LV EF: 60% -   65%   09/12/2016 Tumor Marker   Patient's tumor was tested for the following markers: CA125 Results of the tumor marker test revealed 98.5   09/12/2016 - 01/29/2018 Chemotherapy   The patient received Doxil and Avastin. Avastin was discontinued in Dec 2019 due to GI hemorrhage   09/26/2016 Tumor Marker   Patient's tumor was tested for the following markers: CA125 Results of the tumor marker test revealed 68.9   10/10/2016 Tumor Marker   Patient's tumor was tested for the following markers: CA125 Results of the tumor marker test revealed 65.6   11/07/2016 Tumor Marker   Patient's tumor was tested for the following markers: CA125 Results of the tumor marker test revealed 46.4   11/29/2016 Imaging   Stable mild peritoneal thickening, suspicious for peritoneal carcinomatosis. No new or progressive disease identified. No evidence of ascites.  No significant change in 10 mm left external iliac and 8 mm mesenteric  lymph nodes.  Stable hepatic cirrhosis, and splenomegaly consistent with portal venous hypertension.  Colonic diverticulosis, without radiographic evidence of diverticulitis.   12/02/2016 Imaging   Normal LV size with EF 55%. Basal inferior and basal inferoseptal hypokinesis. Normal RV size and systolic function. No significant valvular abnormalities.   12/05/2016 Tumor Marker   Patient's tumor was tested for the following markers: CA125 Results of the tumor marker test revealed 49.1   01/09/2017 Tumor Marker   Patient's tumor was tested for the following markers: CA125 Results of the tumor marker test revealed 47.2   02/06/2017 Tumor Marker   Patient's tumor was tested for the following markers: CA125 Results of the tumor marker test revealed 44.1   02/18/2017 Imaging   Stable mild peritoneal thickening. No new or progressive disease identified within the abdomen or pelvis.  Stable tiny sub-cm left external iliac and mesenteric lymph nodes.  Stable hepatic cirrhosis, and splenomegaly consistent with portal venous hypertension. No evidence of hepatic neoplasm.  Colonic diverticulosis, without radiographic evidence of diverticulitis.   03/06/2017 Tumor Marker   Patient's tumor was tested for the following markers: CA125 Results of the tumor marker test revealed 50.4   03/17/2017 Imaging   - Left  ventricle: The cavity size was normal. Wall thickness was normal. Systolic function was normal. The estimated ejection fraction was in the range of 55% to 60%. Wall motion was normal; there were no regional wall motion abnormalities. Features are consistent with a pseudonormal left ventricular filling pattern, with concomitant abnormal relaxation and increased filling pressure (grade 2 diastolic dysfunction). - Mitral valve: There was mild regurgitation. - Left atrium: The atrium was mildly dilated   04/03/2017 Tumor Marker   Patient's tumor was tested for the following markers:  CA125 Results of the tumor marker test revealed 47.2   05/14/2017 Imaging   CT scan of abdomen and pelvis Stable mild peritoneal thickening. No new or progressive disease within the abdomen or pelvis.  Stable hepatic cirrhosis. Stable splenomegaly, consistent with portal venous hypertension. No evidence of hepatic neoplasm.   05/15/2017 Tumor Marker   Patient's tumor was tested for the following markers: CA125 Results of the tumor marker test revealed 56.1   06/12/2017 Tumor Marker   Patient's tumor was tested for the following markers: CA125 Results of the tumor marker test revealed 49.2   07/10/2017 Tumor Marker   Patient's tumor was tested for the following markers: CA125 Results of the tumor marker test revealed 46.3   08/07/2017 Tumor Marker   Patient's tumor was tested for the following markers: CA125 Results of the tumor marker test revealed 52.9   08/20/2017 Imaging   1. Mild omental/peritoneal haziness, unchanged. No evidence of new metastatic disease. 2. Cirrhosis with splenomegaly.   09/04/2017 Tumor Marker   Patient's tumor was tested for the following markers: CA125 Results of the tumor marker test revealed 48.5   11/13/2017 Tumor Marker   Patient's tumor was tested for the following markers: CA125 Results of the tumor marker test revealed 55.8   12/17/2017 Imaging   1. No findings to suggest metastatic disease in the abdomen or pelvis. 2. Severe hepatic cirrhosis with evidence of portal hypertension, as demonstrated by dilated portal vein, splenomegaly and portosystemic collateral pathways, including gastric and esophageal varices.  3. Colonic diverticulosis without evidence of acute diverticulitis at this time. 4. Additional incidental findings, as above.   01/16/2018 Tumor Marker   Patient's tumor was tested for the following markers: CA125 Results of the tumor marker test revealed 63.7   02/10/2018 Procedure   EGD - Recently bleeding grade III and large (> 5 mm)  esophageal varices. Completely eradicated. Banded. - Portal hypertensive gastropathy. - Normal examined duodenum. - No specimens collected   02/13/2018 Tumor Marker   Patient's tumor was tested for the following markers: CA125 Results of the tumor marker test revealed 82.7   02/25/2018 Imaging   Bone density showed mild osteopenia   04/08/2018 Tumor Marker   Patient's tumor was tested for the following markers: CA125 Results of the tumor marker test revealed 82.7   04/08/2018 Imaging   1. No definite omental or peritoneal surface lesions. However, there are 2 slowly enlarging lymph nodes noted. One is in the small bowel mesentery and the other is in the left deep pelvis. Could not exclude recurrent disease. 2. Stable advanced cirrhotic changes involving the liver with portal venous hypertension, portal venous collaterals, esophageal varices and splenomegaly. No worrisome hepatic lesions. 3. Diffuse wall thickening of the colon could suggest diffuse colitis or could be due to low albumin. Recommend correlation with any symptoms such as diarrhea.   05/21/2018 Tumor Marker   Patient's tumor was tested for the following markers: CA125 Results of the tumor marker test  revealed 86   09/07/2018 Tumor Marker   Patient's tumor was tested for the following markers:CA-125 Results of the tumor marker test revealed 111   09/29/2018 Tumor Marker   Patient's tumor was tested for the following markers: CA-125 Results of the tumor marker test revealed 121.   09/29/2018 - 11/24/2018 Chemotherapy   The patient had carboplatin and gemzar for chemotherapy treatment.  Chemo was stopped due to allergic reaction to carboplatin   10/27/2018 Tumor Marker   Patient's tumor was tested for the following markers: CA-125 Results of the tumor marker test revealed 99.9   11/24/2018 Tumor Marker   Patient's tumor was tested for the following markers: CA-125 Results of the tumor marker test revealed 81.2    12/14/2018 Tumor Marker   Patient's tumor was tested for the following markers: CA-125 Results of the tumor marker test revealed 81.2   12/14/2018 Imaging   1. Mild improvement in peritoneal disease. Peritoneal nodule within the left posterior pelvis contiguous with the left side of vaginal cuff is slightly decreased in size in the interval. Within the right iliac fossa there is a small peritoneal nodule which has decreased in size in the interval. Central small bowel mesenteric lymph node is not significantly changed. Slight decrease in size left periaortic lymph node.    2. Morphologic features of the liver compatible with cirrhosis. Splenomegaly is noted which may reflect portal venous hypertension.     REVIEW OF SYSTEMS:   Constitutional: Denies fevers, chills or abnormal weight loss Eyes: Denies blurriness of vision Ears, nose, mouth, throat, and face: Denies mucositis or sore throat Respiratory: Denies cough, dyspnea or wheezes Cardiovascular: Denies palpitation, chest discomfort or lower extremity swelling Gastrointestinal:  Denies nausea, heartburn or change in bowel habits Skin: Denies abnormal skin rashes Lymphatics: Denies new lymphadenopathy or easy bruising Behavioral/Psych: Mood is stable, no new changes  All other systems were reviewed with the patient and are negative.  I have reviewed the past medical history, past surgical history, social history and family history with the patient and they are unchanged from previous note.  ALLERGIES:  is allergic to carboplatin; shellfish allergy; benadryl [diphenhydramine hcl]; penicillins; and tape.  MEDICATIONS:  Current Outpatient Medications  Medication Sig Dispense Refill  . cholecalciferol (VITAMIN D) 1000 units tablet Take 1,000 Units by mouth at bedtime.     Marland Kitchen dexamethasone (DECADRON) 4 MG tablet Start the day before Taxotere, take 1 tablet in the morning, and then 1 tablet daily after chemo for 2 days. Pls dispense 30 tabs  30 tablet 1  . empagliflozin (JARDIANCE) 10 MG TABS tablet Take 10 mg by mouth daily.    Marland Kitchen gabapentin (NEURONTIN) 300 MG capsule TAKE 2 CAPSULES(600 MG) BY MOUTH TWICE DAILY (Patient taking differently: Take 600 mg by mouth 2 (two) times daily. ) 120 capsule 11  . hydrochlorothiazide (MICROZIDE) 12.5 MG capsule Take 12.5 mg by mouth daily after breakfast.   1  . HYDROcodone-acetaminophen (NORCO/VICODIN) 5-325 MG tablet Take 1 tablet by mouth every 6 (six) hours as needed for moderate pain. 60 tablet 0  . insulin degludec (TRESIBA FLEXTOUCH) 100 UNIT/ML SOPN FlexTouch Pen Inject 32 Units into the skin daily after breakfast.    . lidocaine-prilocaine (EMLA) cream Apply to Porta-cath  1-2 hours prior to access as directed. (Patient taking differently: Apply 1 application topically daily as needed (port access). ) 30 g 9  . nadolol (CORGARD) 20 MG tablet Take 1 tablet (20 mg total) by mouth daily. (Patient taking differently: Take  20 mg by mouth at bedtime. ) 90 tablet 3  . ondansetron (ZOFRAN) 8 MG tablet Take 1 tablet (8 mg total) by mouth every 8 (eight) hours as needed. Start on the third day after chemotherapy. 30 tablet 1  . pantoprazole (PROTONIX) 40 MG tablet Take 1 tablet (40 mg total) by mouth daily.    . prochlorperazine (COMPAZINE) 10 MG tablet Take 1 tablet (10 mg total) by mouth every 6 (six) hours as needed (Nausea or vomiting). 30 tablet 1  . rotigotine (NEUPRO) 4 MG/24HR APPLY 1 PATCH EXTERNALLY TO THE SKIN DAILY (Patient taking differently: Place 1 patch onto the skin daily. ) 30 patch 5  . valsartan (DIOVAN) 40 MG tablet Take 40 mg by mouth daily.     No current facility-administered medications for this visit.     PHYSICAL EXAMINATION: ECOG PERFORMANCE STATUS: 1 - Symptomatic but completely ambulatory  Vitals:   12/15/18 1124  BP: (!) 119/51  Pulse: 65  Resp: 16  Temp: 98 F (36.7 C)  SpO2: 98%   Filed Weights   12/15/18 1124  Weight: 190 lb 8 oz (86.4 kg)     GENERAL:alert, no distress and comfortable Musculoskeletal:no cyanosis of digits and no clubbing  NEURO: alert & oriented x 3 with fluent speech, no focal motor/sensory deficits  LABORATORY DATA:  I have reviewed the data as listed    Component Value Date/Time   NA 143 11/24/2018 0800   NA 142 02/06/2017 0742   K 4.4 11/24/2018 0800   K 3.9 02/06/2017 0742   CL 108 11/24/2018 0800   CO2 24 11/24/2018 0800   CO2 22 02/06/2017 0742   GLUCOSE 159 (H) 11/24/2018 0800   GLUCOSE 156 (H) 02/06/2017 0742   BUN 24 (H) 11/24/2018 0800   BUN 13.5 02/06/2017 0742   CREATININE 0.99 11/24/2018 0800   CREATININE 0.8 02/06/2017 0742   CALCIUM 9.5 11/24/2018 0800   CALCIUM 8.5 02/06/2017 0742   PROT 8.1 11/24/2018 0800   PROT 6.9 02/06/2017 0742   ALBUMIN 4.0 11/24/2018 0800   ALBUMIN 3.7 02/06/2017 0742   AST 37 11/24/2018 0800   AST 47 (H) 02/06/2017 0742   ALT 37 11/24/2018 0800   ALT 54 02/06/2017 0742   ALKPHOS 144 (H) 11/24/2018 0800   ALKPHOS 130 02/06/2017 0742   BILITOT 0.8 11/24/2018 0800   BILITOT 0.65 02/06/2017 0742   GFRNONAA 57 (L) 11/24/2018 0800   GFRAA >60 11/24/2018 0800    No results found for: SPEP, UPEP  Lab Results  Component Value Date   WBC 4.4 11/24/2018   NEUTROABS 2.8 11/24/2018   HGB 12.1 11/24/2018   HCT 36.5 11/24/2018   MCV 92.9 11/24/2018   PLT 82 (L) 11/24/2018      Chemistry      Component Value Date/Time   NA 143 11/24/2018 0800   NA 142 02/06/2017 0742   K 4.4 11/24/2018 0800   K 3.9 02/06/2017 0742   CL 108 11/24/2018 0800   CO2 24 11/24/2018 0800   CO2 22 02/06/2017 0742   BUN 24 (H) 11/24/2018 0800   BUN 13.5 02/06/2017 0742   CREATININE 0.99 11/24/2018 0800   CREATININE 0.8 02/06/2017 0742      Component Value Date/Time   CALCIUM 9.5 11/24/2018 0800   CALCIUM 8.5 02/06/2017 0742   ALKPHOS 144 (H) 11/24/2018 0800   ALKPHOS 130 02/06/2017 0742   AST 37 11/24/2018 0800   AST 47 (H) 02/06/2017 0742   ALT 37 11/24/2018  0800   ALT 54 02/06/2017 0742   BILITOT 0.8 11/24/2018 0800   BILITOT 0.65 02/06/2017 0742       RADIOGRAPHIC STUDIES: I have reviewed multiple imaging studies with the patient I have personally reviewed the radiological images as listed and agreed with the findings in the report. Ct Abdomen Pelvis W Contrast  Addendum Date: 12/15/2018   ADDENDUM REPORT: 12/15/2018 12:23 ADDENDUM: The original report used examination from 04/08/2018 as comparison. The following is a comparison to the more recent previous exam from 09/15/2018. CLINICAL DATA:   Peritoneal carcinoma.  Restaging. EXAM: CT ABDOMEN AND PELVIS WITH CONTRAST TECHNIQUE: Multidetector CT imaging of the abdomen and pelvis was performed using the standard protocol following bolus administration of intravenous contrast. CONTRAST:   117m OMNIPAQUE IOHEXOL 300 MG/ML  SOLN COMPARISON: 09/15/2018 FINDINGS: Lower chest: Bibasilar linear scarring identified. Hepatobiliary: There is a diffusely nodular contour to the liver compatible with cirrhosis. No focal liver abnormality identified. The gallbladder is unremarkable. Pancreas: Unremarkable. No pancreatic ductal dilatation or surrounding inflammatory changes. Spleen: The spleen is enlarged measuring 13.6 cm in length. Adrenals/Urinary Tract: Normal appearance of the adrenal glands. No kidney mass or hydronephrosis identified. The urinary bladder appears normal. Stomach/Bowel: The stomach is unremarkable. No small bowel wall thickening, inflammation or distension. Persistent wall thickening involving the cecum which is contiguous with adjacent peritoneal thickening and nodularity. Cannot rule out serosal involvement by tumor. No pathologic dilatation of the colon noted. Vascular/Lymphatic: Aortic atherosclerosis. No aneurysm. Left periaortic lymph node measures 1 cm, image 22/2. Previously 1.1 cm. Central small bowel mesenteric node measures 1 cm, image 54/2. Previously 1.0 cm. No enlarged pelvic or  inguinal lymph nodes. Reproductive: Status post hysterectomy. Soft tissue nodule within the left pelvis contiguous with the left side of vaginal cuff measures 2.3 x 2.1 cm, image 75/2. Previously 2.7 x 2.3 cm. Other: There is mild bilateral peritoneal thickening. Similar to previous exam. Soft tissue nodule within the right posterior iliac fossa measures 0.8 cm, image 50/2. Previously this measured 0.9 cm. Musculoskeletal: No acute or significant osseous findings. Stable superior endplate deformity involving the L3 vertebra. IMPRESSION: 1. Mild improvement in peritoneal disease. Peritoneal nodule within the left posterior pelvis contiguous with the left side of vaginal cuff is slightly decreased in size in the interval. Within the right iliac fossa there is a small peritoneal nodule which has decreased in size in the interval. Central small bowel mesenteric lymph node is not significantly changed. Slight decrease in size left periaortic lymph node. 2. Morphologic features of the liver compatible with cirrhosis. Splenomegaly is noted which may reflect portal venous hypertension. Electronically Signed   By: TKerby MoorsM.D.   On: 12/15/2018 12:23   Result Date: 12/15/2018 CLINICAL DATA:  Peritoneal carcinoma.  Restaging. EXAM: CT ABDOMEN AND PELVIS WITH CONTRAST TECHNIQUE: Multidetector CT imaging of the abdomen and pelvis was performed using the standard protocol following bolus administration of intravenous contrast. CONTRAST:  10110mOMNIPAQUE IOHEXOL 300 MG/ML  SOLN COMPARISON:  04/08/2018 FINDINGS: Lower chest: Bibasilar linear scarring identified. Hepatobiliary: There is a diffusely nodular contour to the liver compatible with cirrhosis. No focal liver abnormality identified. The gallbladder is unremarkable. Pancreas: Unremarkable. No pancreatic ductal dilatation or surrounding inflammatory changes. Spleen: The spleen is enlarged measuring 13.6 cm in length. Adrenals/Urinary Tract: Normal appearance of the  adrenal glands. No kidney mass or hydronephrosis identified. The urinary bladder appears normal. Stomach/Bowel: The stomach is unremarkable. No small bowel wall thickening, inflammation or distension. Persistent wall thickening involving  the cecum which is contiguous with adjacent peritoneal thickening and nodularity. Cannot rule out serosal involvement by tumor. No pathologic dilatation of the colon noted. Vascular/Lymphatic: Aortic atherosclerosis. No aneurysm. Left periaortic lymph node measures 1 cm, image 22/2. Unchanged. Central small bowel mesenteric node measures 1 cm, image 54/2. Previously 0.8 cm. No enlarged pelvic or inguinal lymph nodes. Reproductive: Status post hysterectomy. Soft tissue nodule within the left pelvis contiguous with the left side of vaginal cuff measures 2.3 x 2.1 cm, image 75/2. Previously 1.9 x 1.6 cm. Other: There is mild bilateral peritoneal thickening. Similar to previous exam. Soft tissue nodule within the right posterior iliac fossa measures 0.8 cm, image 50/2. Previously this measured 0.4 cm. Musculoskeletal: No acute or significant osseous findings. Stable superior endplate deformity involving the L3 vertebra. IMPRESSION: 1. Mild progression of disease. Peritoneal nodule within the left posterior pelvis contiguous with the left side of vaginal cuff is slightly increased in size in the interval. Within the right iliac fossa there is a small peritoneal nodule which has increased in size in the interval. Central small bowel mesenteric lymph node is mildly increased in size. Currently 1 cm. Previously 0.8 cm. Persistent mural thickening involving the cecum which appears contiguous with peritoneal thickening is concerning for serosal involvement by tumor. 2. Morphologic features of the liver compatible with cirrhosis. Splenomegaly is noted which may reflect portal venous hypertension. Electronically Signed: By: Kerby Moors M.D. On: 12/14/2018 14:24    All questions were  answered. The patient knows to call the clinic with any problems, questions or concerns. No barriers to learning was detected.  I spent 30 minutes counseling the patient face to face. The total time spent in the appointment was 40 minutes and more than 50% was on counseling and review of test results  [name], MD 12/15/2018 12:44 PM

## 2018-12-15 NOTE — Assessment & Plan Note (Signed)
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

## 2018-12-15 NOTE — Telephone Encounter (Signed)
Scheduled apt per 11/3 sch message - pt is aware of appt dates and times.

## 2018-12-15 NOTE — Telephone Encounter (Signed)
-----   Message from [name], MD sent at 12/15/2018 12:45 PM EST ----- Regarding: Ct report amended Pls let her know CT radiologist amended the report. She has positive response to therapy It does not change our decision She can get a new copy when she show up for chemo next week

## 2018-12-15 NOTE — Telephone Encounter (Signed)
She called and left a message to call her. Mychart sent a message that appts for infusion/lab canceled.  Called her back. Per Dr. Alvy Bimler, appts canceled, she is gong to review scan results and she needs to bring her husband. She verbalized understanding.

## 2018-12-15 NOTE — Assessment & Plan Note (Signed)
I have reviewed CT imaging with the patient and her husband Radiologist made an addendum to her CT imaging Overall, she has positive response to therapy However, due to allergic reaction, we are not able to proceed with more treatment We discussed alternative treatment options by reviewing the current NCCN guidelines We discussed the risk and benefits of Taxotere versus gemcitabine as maintenance therapy and she elected for Taxotere  I reviewed the current guidelines with her This is an FDA approved drug Goals of treatment is palliative  Gynecol Oncol. 2003 Feb;88(2):130-5. 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 Rose PG1, Blessing JA, [place] HG, [place], Gro[place] D, [facility]. Abstract OBJECTIVES:  Docetaxel is an inhibitor of microtubule depolymerization and has demonstrated activity in paclitaxel-resistant breast cancer and gynecologic cancer. The Gynecologic Oncology Group (GOG) conducted a study of docetaxel in paclitaxel-resistant ovarian and peritoneal carcinoma to determine its activity, and nature and degree of toxicity, in this cohort of patients. METHODS:  Patients with platinum- and paclitaxel-resistant ovarian or peritoneal carcinoma, defined as progression while on or within 6 months of therapy, were eligible if they had measurable disease and had not received more than one chemotherapy regimen. Docetaxel at a dose of 100 mg/m(2) was administered iv over 1 h every 21 days. A prophylactic regimen of oral dexamethasone 8 mg bid was begun 24 h before docetaxel administration and continued for 48 h thereafter. Hepatic function was strictly monitored. RESULTS:  Sixty patients were entered and treated with a total of 256 courses, with all 60 evaluable for toxicity and 58 evaluable for response. Responses were observed in 22.4% of patients, with 5.2% achieving complete response and 17.2% achieving  partial response (95% CI, 12.5-35.3%). The median duration of response was 2.5 months. The likelihood of observing a response did not appear to be related to the length of the prior paclitaxel-free interval or duration of prior paclitaxel infusions. The principal adverse effect of grade 4 neutropenia occurred in 75% of patients. There was one treatment-related death. Dose reductions were required in 36% of patients. CONCLUSIONS:  Docetaxel is active in paclitaxel-resistant ovarian and peritoneal cancer but, in view of significant hematologic toxicity, further study is warranted to ascertain its optimal dose and schedule.  We discussed the role of chemotherapy. The intent is for palliative.  We discussed some of the risks, benefits, side-effects of Docetaxel.   Some of the short term side-effects included, though not limited to, risk of severe allergic reaction, fatigue, weight loss, pancytopenia, life-threatening infections, need for transfusions of blood products, nausea, vomiting, change in bowel habits, loss of hair, admission to hospital for various reasons, and risks of death.   Long term side-effects are also discussed including risks of infertility, permanent damage to nerve function, chronic fatigue, and rare secondary malignancy including bone marrow disorders.   The patient is aware that the response rates discussed earlier is not guaranteed.   After a long discussion, patient made an informed decision to proceed with the prescribed plan of care.   Patient education material was dispensed Due to anticipated high risks of neurotoxicity and neutropenia, I plan to prescribe mild dose reduction at 50 mg/m2 instead of 100 mg/m2 as described above I do not plan prophylactic G-CSF support We will get her started next week I plan to reduce oral as well as IV premedication dexamethasone due to poorly controlled diabetes

## 2018-12-21 ENCOUNTER — Inpatient Hospital Stay: Payer: Medicare Other

## 2018-12-21 ENCOUNTER — Other Ambulatory Visit: Payer: Self-pay

## 2018-12-21 VITALS — BP 135/66 | HR 63 | Temp 98.6°F | Resp 20

## 2018-12-21 DIAGNOSIS — Z5111 Encounter for antineoplastic chemotherapy: Secondary | ICD-10-CM | POA: Diagnosis not present

## 2018-12-21 DIAGNOSIS — C569 Malignant neoplasm of unspecified ovary: Secondary | ICD-10-CM

## 2018-12-21 DIAGNOSIS — C482 Malignant neoplasm of peritoneum, unspecified: Secondary | ICD-10-CM

## 2018-12-21 DIAGNOSIS — Z7189 Other specified counseling: Secondary | ICD-10-CM

## 2018-12-21 LAB — CBC WITH DIFFERENTIAL (CANCER CENTER ONLY)
Abs Immature Granulocytes: 0.04 10*3/uL (ref 0.00–0.07)
Basophils Absolute: 0 10*3/uL (ref 0.0–0.1)
Basophils Relative: 0 %
Eosinophils Absolute: 0.1 10*3/uL (ref 0.0–0.5)
Eosinophils Relative: 1 %
HCT: 35.7 % — ABNORMAL LOW (ref 36.0–46.0)
Hemoglobin: 12 g/dL (ref 12.0–15.0)
Immature Granulocytes: 1 %
Lymphocytes Relative: 23 %
Lymphs Abs: 1.6 10*3/uL (ref 0.7–4.0)
MCH: 31.7 pg (ref 26.0–34.0)
MCHC: 33.6 g/dL (ref 30.0–36.0)
MCV: 94.2 fL (ref 80.0–100.0)
Monocytes Absolute: 0.6 10*3/uL (ref 0.1–1.0)
Monocytes Relative: 8 %
Neutro Abs: 4.7 10*3/uL (ref 1.7–7.7)
Neutrophils Relative %: 67 %
Platelet Count: 80 10*3/uL — ABNORMAL LOW (ref 150–400)
RBC: 3.79 MIL/uL — ABNORMAL LOW (ref 3.87–5.11)
RDW: 16.2 % — ABNORMAL HIGH (ref 11.5–15.5)
WBC Count: 7 10*3/uL (ref 4.0–10.5)
nRBC: 0 % (ref 0.0–0.2)

## 2018-12-21 LAB — CMP (CANCER CENTER ONLY)
ALT: 33 U/L (ref 0–44)
AST: 26 U/L (ref 15–41)
Albumin: 4.1 g/dL (ref 3.5–5.0)
Alkaline Phosphatase: 133 U/L — ABNORMAL HIGH (ref 38–126)
Anion gap: 14 (ref 5–15)
BUN: 23 mg/dL (ref 8–23)
CO2: 23 mmol/L (ref 22–32)
Calcium: 9 mg/dL (ref 8.9–10.3)
Chloride: 105 mmol/L (ref 98–111)
Creatinine: 1.01 mg/dL — ABNORMAL HIGH (ref 0.44–1.00)
GFR, Est AFR Am: >60 mL/min (ref >60)
GFR, Estimated: 56 mL/min — ABNORMAL LOW (ref >60)
Glucose, Bld: 138 mg/dL — ABNORMAL HIGH (ref 70–99)
Potassium: 3.8 mmol/L (ref 3.5–5.1)
Sodium: 142 mmol/L (ref 135–145)
Total Bilirubin: 0.8 mg/dL (ref 0.3–1.2)
Total Protein: 7.7 g/dL (ref 6.5–8.1)

## 2018-12-21 MED ORDER — SODIUM CHLORIDE 0.9 % IV SOLN: 5.0000 mg | Freq: Once | INTRAVENOUS | Status: DC

## 2018-12-21 MED ORDER — SODIUM CHLORIDE 0.9% FLUSH
10.0000 mL | INTRAVENOUS | Status: DC | PRN
  Administered 2018-12-21: 10 mL
  Filled 2018-12-21: qty 10

## 2018-12-21 MED ORDER — HEPARIN SOD (PORK) LOCK FLUSH 100 UNIT/ML IV SOLN
500.0000 [IU] | Freq: Once | INTRAVENOUS | Status: AC | PRN
  Administered 2018-12-21: 500 [IU]
  Filled 2018-12-21: qty 5

## 2018-12-21 MED ORDER — DEXAMETHASONE SODIUM PHOSPHATE 10 MG/ML IJ SOLN
5.0000 mg | Freq: Once | INTRAMUSCULAR | Status: AC
  Administered 2018-12-21: 5 mg via INTRAVENOUS

## 2018-12-21 MED ORDER — SODIUM CHLORIDE 0.9% FLUSH
10.0000 mL | Freq: Once | INTRAVENOUS | Status: AC
  Administered 2018-12-21: 10 mL
  Filled 2018-12-21: qty 10

## 2018-12-21 MED ORDER — DEXAMETHASONE SODIUM PHOSPHATE 10 MG/ML IJ SOLN
INTRAMUSCULAR | Status: AC
  Filled 2018-12-21: qty 1

## 2018-12-21 MED ORDER — SODIUM CHLORIDE 0.9 % IV SOLN
Freq: Once | INTRAVENOUS | Status: AC
  Administered 2018-12-21: 09:00:00 via INTRAVENOUS
  Filled 2018-12-21: qty 250

## 2018-12-21 MED ORDER — SODIUM CHLORIDE 0.9 % IV SOLN
50.0000 mg/m2 | Freq: Once | INTRAVENOUS | Status: AC
  Administered 2018-12-21: 100 mg via INTRAVENOUS
  Filled 2018-12-21: qty 10

## 2018-12-21 NOTE — Patient Instructions (Signed)
[facility] [place] [facility] Discharge Instructions for Patients Receiving Chemotherapy  Today you received the following chemotherapy agents docetaxel  To help prevent nausea and vomiting after your treatment, we encourage you to take your nausea medication as directed.   If you develop nausea and vomiting that is not controlled by your nausea medication, call the clinic.   BELOW ARE SYMPTOMS THAT SHOULD BE REPORTED IMMEDIATELY:  *FEVER GREATER THAN 100.5 F  *CHILLS WITH OR WITHOUT FEVER  NAUSEA AND VOMITING THAT IS NOT CONTROLLED WITH YOUR NAUSEA MEDICATION  *UNUSUAL SHORTNESS OF BREATH  *UNUSUAL BRUISING OR BLEEDING  TENDERNESS IN MOUTH AND THROAT WITH OR WITHOUT PRESENCE OF ULCERS  *URINARY PROBLEMS  *BOWEL PROBLEMS  UNUSUAL RASH Items with * indicate a potential emergency and should be followed up as soon as possible.  Feel free to call the clinic should you have any questions or concerns. The clinic phone number is [phone].  Please show the [facility] at check-in to the Emergency Department and triage nurse.   Docetaxel injection What is this medicine? DOCETAXEL (doe se TAX el) is a chemotherapy drug. It targets fast dividing cells, like cancer cells, and causes these cells to die. This medicine is used to treat many types of cancers like breast cancer, certain stomach cancers, head and neck cancer, lung cancer, and prostate cancer. This medicine may be used for other purposes; ask your health care provider or pharmacist if you have questions. COMMON BRAND NAME(S): Docefrez, Taxotere What should I tell my health care provider before I take this medicine? They need to know if you have any of these conditions:  infection (especially a virus infection such as chickenpox, cold sores, or herpes)  liver disease  low blood counts, like low white cell, platelet, or red cell counts  an unusual or allergic reaction to docetaxel, polysorbate 80, other  chemotherapy agents, other medicines, foods, dyes, or preservatives  pregnant or trying to get pregnant  breast-feeding How should I use this medicine? This drug is given as an infusion into a vein. It is administered in a hospital or clinic by a specially trained health care professional. Talk to your pediatrician regarding the use of this medicine in children. Special care may be needed. Overdosage: If you think you have taken too much of this medicine contact a poison control center or emergency room at once. NOTE: This medicine is only for you. Do not share this medicine with others. What if I miss a dose? It is important not to miss your dose. Call your doctor or health care professional if you are unable to keep an appointment. What may interact with this medicine?  aprepitant  certain antibiotics like erythromycin or clarithromycin  certain antivirals for HIV or hepatitis  certain medicines for fungal infections like fluconazole, itraconazole, ketoconazole, posaconazole, or voriconazole  cimetidine  ciprofloxacin  conivaptan  cyclosporine  dronedarone  fluvoxamine  grapefruit juice  imatinib  verapamil This list may not describe all possible interactions. Give your health care provider a list of all the medicines, herbs, non-prescription drugs, or dietary supplements you use. Also tell them if you smoke, drink alcohol, or use illegal drugs. Some items may interact with your medicine. What should I watch for while using this medicine? Your condition will be monitored carefully while you are receiving this medicine. You will need important blood work done while you are taking this medicine. Call your doctor or health care professional for advice if you get a fever, chills  or sore throat, or other symptoms of a cold or flu. Do not treat yourself. This drug decreases your body's ability to fight infections. Try to avoid being around people who are sick. Some products may  contain alcohol. Ask your health care professional if this medicine contains alcohol. Be sure to tell all health care professionals you are taking this medicine. Certain medicines, like metronidazole and disulfiram, can cause an unpleasant reaction when taken with alcohol. The reaction includes flushing, headache, nausea, vomiting, sweating, and increased thirst. The reaction can last from 30 minutes to several hours. You may get drowsy or dizzy. Do not drive, use machinery, or do anything that needs mental alertness until you know how this medicine affects you. Do not stand or sit up quickly, especially if you are an older patient. This reduces the risk of dizzy or fainting spells. Alcohol may interfere with the effect of this medicine. Talk to your health care professional about your risk of cancer. You may be more at risk for certain types of cancer if you take this medicine. Do not become pregnant while taking this medicine or for 6 months after stopping it. Women should inform their doctor if they wish to become pregnant or think they might be pregnant. There is a potential for serious side effects to an unborn child. Talk to your health care professional or pharmacist for more information. Do not breast-feed an infant while taking this medicine or for 1 to 2 weeks after stopping it. Males who get this medicine must use a condom during sex with females who can get pregnant. If you get a woman pregnant, the baby could have birth defects. The baby could die before they are born. You will need to continue wearing a condom for 3 months after stopping the medicine. Tell your health care provider right away if your partner becomes pregnant while you are taking this medicine. This may interfere with the ability to father a child. You should talk to your doctor or health care professional if you are concerned about your fertility. What side effects may I notice from receiving this medicine? Side effects that you  should report to your doctor or health care professional as soon as possible:  allergic reactions like skin rash, itching or hives, swelling of the face, lips, or tongue  blurred vision  breathing problems  changes in vision  low blood counts - This drug may decrease the number of white blood cells, red blood cells and platelets. You may be at increased risk for infections and bleeding.  nausea and vomiting  pain, redness or irritation at site where injected  pain, tingling, numbness in the hands or feet  redness, blistering, peeling, or loosening of the skin, including inside the mouth  signs of decreased platelets or bleeding - bruising, pinpoint red spots on the skin, black, tarry stools, nosebleeds  signs of decreased red blood cells - unusually weak or tired, fainting spells, lightheadedness  signs of infection - fever or chills, cough, sore throat, pain or difficulty passing urine  swelling of the ankle, feet, hands Side effects that usually do not require medical attention (report to your doctor or health care professional if they continue or are bothersome):  constipation  diarrhea  fingernail or toenail changes  hair loss  loss of appetite  mouth sores  muscle pain This list may not describe all possible side effects. Call your doctor for medical advice about side effects. You may report side effects to FDA at  1-800-FDA-1088. Where should I keep my medicine? This drug is given in a hospital or clinic and will not be stored at home. NOTE: This sheet is a summary. It may not cover all possible information. If you have questions about this medicine, talk to your doctor, pharmacist, or health care provider.  2020 [employer]vier/Gold Standard (2018-04-03 12:23:11)

## 2018-12-21 NOTE — Patient Instructions (Signed)

## 2018-12-22 ENCOUNTER — Telehealth: Payer: Self-pay | Admitting: *Deleted

## 2018-12-22 LAB — CA 125: Cancer Antigen (CA) 125: 64.2 U/mL — ABNORMAL HIGH (ref 0.0–38.1)

## 2018-12-22 NOTE — Telephone Encounter (Signed)
-----   Message from Priscille Loveless, RN sent at 12/21/2018 10:40 AM EST ----- Regarding: Alvy Bimler first chemo f/u First taxotere followup

## 2018-12-22 NOTE — Telephone Encounter (Signed)
Called pt to see how she did with her treatment yest & she reports that she had a little nausea this am & has had off & on but hasn't taken anything.  ENcouraged her to take nausea med & she states she will try later this pm to hopefully help her rest some.  She also reports some hoarseness which she attributes to the steroid but denies any other symptoms.  She reports knowing side effects & how to reach [place] if needed.

## 2018-12-30 ENCOUNTER — Inpatient Hospital Stay (HOSPITAL_COMMUNITY): Payer: Medicare Other

## 2018-12-30 ENCOUNTER — Emergency Department (HOSPITAL_COMMUNITY): Payer: Medicare Other

## 2018-12-30 ENCOUNTER — Encounter (HOSPITAL_COMMUNITY): Payer: Self-pay | Admitting: Emergency Medicine

## 2018-12-30 ENCOUNTER — Other Ambulatory Visit: Payer: Self-pay

## 2018-12-30 ENCOUNTER — Telehealth: Payer: Self-pay

## 2018-12-30 ENCOUNTER — Inpatient Hospital Stay (HOSPITAL_COMMUNITY)
Admission: EM | Admit: 2018-12-30 | Discharge: 2019-01-04 | DRG: 871 | Disposition: A | Discharge status: Home / Self Care | Payer: Medicare Other | Attending: Internal Medicine | Admitting: Internal Medicine

## 2018-12-30 DIAGNOSIS — S065X9A Traumatic subdural hemorrhage with loss of consciousness of unspecified duration, initial encounter: Secondary | ICD-10-CM | POA: Diagnosis present

## 2018-12-30 DIAGNOSIS — L538 Other specified erythematous conditions: Secondary | ICD-10-CM | POA: Diagnosis not present

## 2018-12-30 DIAGNOSIS — I609 Nontraumatic subarachnoid hemorrhage, unspecified: Secondary | ICD-10-CM | POA: Diagnosis not present

## 2018-12-30 DIAGNOSIS — N3281 Overactive bladder: Secondary | ICD-10-CM | POA: Diagnosis present

## 2018-12-30 DIAGNOSIS — D703 Neutropenia due to infection: Secondary | ICD-10-CM | POA: Diagnosis present

## 2018-12-30 DIAGNOSIS — I361 Nonrheumatic tricuspid (valve) insufficiency: Secondary | ICD-10-CM | POA: Diagnosis not present

## 2018-12-30 DIAGNOSIS — R5381 Other malaise: Secondary | ICD-10-CM | POA: Diagnosis present

## 2018-12-30 DIAGNOSIS — Z5111 Encounter for antineoplastic chemotherapy: Secondary | ICD-10-CM

## 2018-12-30 DIAGNOSIS — I10 Essential (primary) hypertension: Secondary | ICD-10-CM | POA: Diagnosis not present

## 2018-12-30 DIAGNOSIS — D684 Acquired coagulation factor deficiency: Secondary | ICD-10-CM | POA: Diagnosis present

## 2018-12-30 DIAGNOSIS — B9562 Methicillin resistant Staphylococcus aureus infection as the cause of diseases classified elsewhere: Secondary | ICD-10-CM | POA: Diagnosis present

## 2018-12-30 DIAGNOSIS — E1140 Type 2 diabetes mellitus with diabetic neuropathy, unspecified: Secondary | ICD-10-CM

## 2018-12-30 DIAGNOSIS — D689 Coagulation defect, unspecified: Secondary | ICD-10-CM

## 2018-12-30 DIAGNOSIS — L0390 Cellulitis, unspecified: Secondary | ICD-10-CM | POA: Diagnosis present

## 2018-12-30 DIAGNOSIS — K766 Portal hypertension: Secondary | ICD-10-CM | POA: Diagnosis present

## 2018-12-30 DIAGNOSIS — I5032 Chronic diastolic (congestive) heart failure: Secondary | ICD-10-CM | POA: Diagnosis present

## 2018-12-30 DIAGNOSIS — D709 Neutropenia, unspecified: Secondary | ICD-10-CM

## 2018-12-30 DIAGNOSIS — Z9079 Acquired absence of other genital organ(s): Secondary | ICD-10-CM

## 2018-12-30 DIAGNOSIS — Z90722 Acquired absence of ovaries, bilateral: Secondary | ICD-10-CM

## 2018-12-30 DIAGNOSIS — K120 Recurrent oral aphthae: Secondary | ICD-10-CM | POA: Diagnosis present

## 2018-12-30 DIAGNOSIS — R6521 Severe sepsis with septic shock: Secondary | ICD-10-CM | POA: Diagnosis present

## 2018-12-30 DIAGNOSIS — K7460 Unspecified cirrhosis of liver: Secondary | ICD-10-CM | POA: Diagnosis present

## 2018-12-30 DIAGNOSIS — W19XXXD Unspecified fall, subsequent encounter: Secondary | ICD-10-CM | POA: Diagnosis not present

## 2018-12-30 DIAGNOSIS — T3695XA Adverse effect of unspecified systemic antibiotic, initial encounter: Secondary | ICD-10-CM | POA: Diagnosis not present

## 2018-12-30 DIAGNOSIS — N390 Urinary tract infection, site not specified: Secondary | ICD-10-CM

## 2018-12-30 DIAGNOSIS — Z8249 Family history of ischemic heart disease and other diseases of the circulatory system: Secondary | ICD-10-CM

## 2018-12-30 DIAGNOSIS — Z91013 Allergy to seafood: Secondary | ICD-10-CM

## 2018-12-30 DIAGNOSIS — D61810 Antineoplastic chemotherapy induced pancytopenia: Secondary | ICD-10-CM | POA: Diagnosis present

## 2018-12-30 DIAGNOSIS — C786 Secondary malignant neoplasm of retroperitoneum and peritoneum: Secondary | ICD-10-CM | POA: Diagnosis present

## 2018-12-30 DIAGNOSIS — S0101XA Laceration without foreign body of scalp, initial encounter: Secondary | ICD-10-CM

## 2018-12-30 DIAGNOSIS — N179 Acute kidney failure, unspecified: Secondary | ICD-10-CM

## 2018-12-30 DIAGNOSIS — R6520 Severe sepsis without septic shock: Secondary | ICD-10-CM | POA: Diagnosis not present

## 2018-12-30 DIAGNOSIS — K1231 Oral mucositis (ulcerative) due to antineoplastic therapy: Secondary | ICD-10-CM

## 2018-12-30 DIAGNOSIS — E669 Obesity, unspecified: Secondary | ICD-10-CM | POA: Diagnosis present

## 2018-12-30 DIAGNOSIS — R609 Edema, unspecified: Secondary | ICD-10-CM

## 2018-12-30 DIAGNOSIS — H4020X0 Unspecified primary angle-closure glaucoma, stage unspecified: Secondary | ICD-10-CM | POA: Diagnosis present

## 2018-12-30 DIAGNOSIS — T451X5A Adverse effect of antineoplastic and immunosuppressive drugs, initial encounter: Secondary | ICD-10-CM

## 2018-12-30 DIAGNOSIS — I2510 Atherosclerotic heart disease of native coronary artery without angina pectoris: Secondary | ICD-10-CM | POA: Diagnosis present

## 2018-12-30 DIAGNOSIS — Z6839 Body mass index (BMI) 39.0-39.9, adult: Secondary | ICD-10-CM

## 2018-12-30 DIAGNOSIS — Z794 Long term (current) use of insulin: Secondary | ICD-10-CM

## 2018-12-30 DIAGNOSIS — R162 Hepatomegaly with splenomegaly, not elsewhere classified: Secondary | ICD-10-CM | POA: Diagnosis present

## 2018-12-30 DIAGNOSIS — E872 Acidosis, unspecified: Secondary | ICD-10-CM | POA: Diagnosis present

## 2018-12-30 DIAGNOSIS — K7581 Nonalcoholic steatohepatitis (NASH): Secondary | ICD-10-CM | POA: Diagnosis not present

## 2018-12-30 DIAGNOSIS — R5081 Fever presenting with conditions classified elsewhere: Secondary | ICD-10-CM | POA: Diagnosis present

## 2018-12-30 DIAGNOSIS — D701 Agranulocytosis secondary to cancer chemotherapy: Secondary | ICD-10-CM

## 2018-12-30 DIAGNOSIS — Z7952 Long term (current) use of systemic steroids: Secondary | ICD-10-CM

## 2018-12-30 DIAGNOSIS — A419 Sepsis, unspecified organism: Secondary | ICD-10-CM | POA: Diagnosis present

## 2018-12-30 DIAGNOSIS — Z1621 Resistance to vancomycin: Secondary | ICD-10-CM | POA: Diagnosis present

## 2018-12-30 DIAGNOSIS — R197 Diarrhea, unspecified: Secondary | ICD-10-CM | POA: Diagnosis not present

## 2018-12-30 DIAGNOSIS — R55 Syncope and collapse: Secondary | ICD-10-CM | POA: Diagnosis present

## 2018-12-30 DIAGNOSIS — Z85828 Personal history of other malignant neoplasm of skin: Secondary | ICD-10-CM

## 2018-12-30 DIAGNOSIS — D849 Immunodeficiency, unspecified: Secondary | ICD-10-CM | POA: Diagnosis present

## 2018-12-30 DIAGNOSIS — M7989 Other specified soft tissue disorders: Secondary | ICD-10-CM | POA: Diagnosis not present

## 2018-12-30 DIAGNOSIS — Z888 Allergy status to other drugs, medicaments and biological substances status: Secondary | ICD-10-CM

## 2018-12-30 DIAGNOSIS — D696 Thrombocytopenia, unspecified: Secondary | ICD-10-CM

## 2018-12-30 DIAGNOSIS — C569 Malignant neoplasm of unspecified ovary: Secondary | ICD-10-CM | POA: Diagnosis present

## 2018-12-30 DIAGNOSIS — Z803 Family history of malignant neoplasm of breast: Secondary | ICD-10-CM

## 2018-12-30 DIAGNOSIS — E876 Hypokalemia: Secondary | ICD-10-CM | POA: Diagnosis not present

## 2018-12-30 DIAGNOSIS — I808 Phlebitis and thrombophlebitis of other sites: Secondary | ICD-10-CM | POA: Diagnosis present

## 2018-12-30 DIAGNOSIS — K521 Toxic gastroenteritis and colitis: Secondary | ICD-10-CM | POA: Diagnosis not present

## 2018-12-30 DIAGNOSIS — I9589 Other hypotension: Secondary | ICD-10-CM | POA: Diagnosis not present

## 2018-12-30 DIAGNOSIS — Z880 Allergy status to penicillin: Secondary | ICD-10-CM

## 2018-12-30 DIAGNOSIS — G2581 Restless legs syndrome: Secondary | ICD-10-CM | POA: Diagnosis present

## 2018-12-30 DIAGNOSIS — Y92009 Unspecified place in unspecified non-institutional (private) residence as the place of occurrence of the external cause: Secondary | ICD-10-CM | POA: Diagnosis not present

## 2018-12-30 DIAGNOSIS — W19XXXA Unspecified fall, initial encounter: Secondary | ICD-10-CM | POA: Diagnosis present

## 2018-12-30 DIAGNOSIS — Z87891 Personal history of nicotine dependence: Secondary | ICD-10-CM

## 2018-12-30 DIAGNOSIS — I809 Phlebitis and thrombophlebitis of unspecified site: Secondary | ICD-10-CM

## 2018-12-30 DIAGNOSIS — B952 Enterococcus as the cause of diseases classified elsewhere: Secondary | ICD-10-CM | POA: Diagnosis present

## 2018-12-30 DIAGNOSIS — D61818 Other pancytopenia: Secondary | ICD-10-CM

## 2018-12-30 DIAGNOSIS — Z79899 Other long term (current) drug therapy: Secondary | ICD-10-CM

## 2018-12-30 DIAGNOSIS — Z825 Family history of asthma and other chronic lower respiratory diseases: Secondary | ICD-10-CM

## 2018-12-30 DIAGNOSIS — D6959 Other secondary thrombocytopenia: Secondary | ICD-10-CM | POA: Diagnosis present

## 2018-12-30 DIAGNOSIS — C482 Malignant neoplasm of peritoneum, unspecified: Secondary | ICD-10-CM

## 2018-12-30 DIAGNOSIS — I429 Cardiomyopathy, unspecified: Secondary | ICD-10-CM | POA: Diagnosis present

## 2018-12-30 DIAGNOSIS — Z7189 Other specified counseling: Secondary | ICD-10-CM

## 2018-12-30 DIAGNOSIS — T801XXA Vascular complications following infusion, transfusion and therapeutic injection, initial encounter: Secondary | ICD-10-CM

## 2018-12-30 DIAGNOSIS — D6481 Anemia due to antineoplastic chemotherapy: Secondary | ICD-10-CM | POA: Diagnosis not present

## 2018-12-30 DIAGNOSIS — Z20828 Contact with and (suspected) exposure to other viral communicable diseases: Secondary | ICD-10-CM | POA: Diagnosis present

## 2018-12-30 DIAGNOSIS — Z833 Family history of diabetes mellitus: Secondary | ICD-10-CM

## 2018-12-30 DIAGNOSIS — J4520 Mild intermittent asthma, uncomplicated: Secondary | ICD-10-CM | POA: Diagnosis present

## 2018-12-30 DIAGNOSIS — E1142 Type 2 diabetes mellitus with diabetic polyneuropathy: Secondary | ICD-10-CM | POA: Diagnosis present

## 2018-12-30 DIAGNOSIS — I252 Old myocardial infarction: Secondary | ICD-10-CM

## 2018-12-30 DIAGNOSIS — I959 Hypotension, unspecified: Secondary | ICD-10-CM | POA: Diagnosis present

## 2018-12-30 DIAGNOSIS — Z9109 Other allergy status, other than to drugs and biological substances: Secondary | ICD-10-CM

## 2018-12-30 DIAGNOSIS — I110 Hypertensive heart disease with heart failure: Secondary | ICD-10-CM | POA: Diagnosis present

## 2018-12-30 DIAGNOSIS — Z90711 Acquired absence of uterus with remaining cervical stump: Secondary | ICD-10-CM

## 2018-12-30 DIAGNOSIS — E860 Dehydration: Secondary | ICD-10-CM | POA: Diagnosis present

## 2018-12-30 DIAGNOSIS — S065XAA Traumatic subdural hemorrhage with loss of consciousness status unknown, initial encounter: Secondary | ICD-10-CM

## 2018-12-30 LAB — GLUCOSE, CAPILLARY
Glucose-Capillary: 135 mg/dL — ABNORMAL HIGH (ref 70–99)
Glucose-Capillary: 93 mg/dL (ref 70–99)
Glucose-Capillary: 93 mg/dL (ref 70–99)

## 2018-12-30 LAB — URINALYSIS, ROUTINE W REFLEX MICROSCOPIC
Bacteria, UA: NONE SEEN
Bilirubin Urine: NEGATIVE
Glucose, UA: >=500 mg/dL — AB
Hgb urine dipstick: NEGATIVE
Ketones, ur: NEGATIVE mg/dL
Leukocytes,Ua: NEGATIVE
Nitrite: NEGATIVE
Protein, ur: NEGATIVE mg/dL
Specific Gravity, Urine: 1.02 (ref 1.005–1.030)
pH: 5 (ref 5.0–8.0)

## 2018-12-30 LAB — CBC WITH DIFFERENTIAL/PLATELET
Abs Immature Granulocytes: 0.02 10*3/uL (ref 0.00–0.07)
Basophils Absolute: 0 10*3/uL (ref 0.0–0.1)
Basophils Relative: 1 %
Eosinophils Absolute: 0 10*3/uL (ref 0.0–0.5)
Eosinophils Relative: 0 %
HCT: 35.9 % — ABNORMAL LOW (ref 36.0–46.0)
Hemoglobin: 11.6 g/dL — ABNORMAL LOW (ref 12.0–15.0)
Immature Granulocytes: 1 %
Lymphocytes Relative: 66 %
Lymphs Abs: 1 10*3/uL (ref 0.7–4.0)
MCH: 31.3 pg (ref 26.0–34.0)
MCHC: 32.3 g/dL (ref 30.0–36.0)
MCV: 96.8 fL (ref 80.0–100.0)
Monocytes Absolute: 0.4 10*3/uL (ref 0.1–1.0)
Monocytes Relative: 27 %
Neutro Abs: 0.1 10*3/uL — ABNORMAL LOW (ref 1.7–7.7)
Neutrophils Relative %: 5 %
Platelets: 83 10*3/uL — ABNORMAL LOW (ref 150–400)
RBC: 3.71 MIL/uL — ABNORMAL LOW (ref 3.87–5.11)
RDW: 15.6 % — ABNORMAL HIGH (ref 11.5–15.5)
WBC: 1.5 10*3/uL — ABNORMAL LOW (ref 4.0–10.5)
nRBC: 3.4 % — ABNORMAL HIGH (ref 0.0–0.2)

## 2018-12-30 LAB — HEMOGLOBIN A1C
Hgb A1c MFr Bld: 8.3 % — ABNORMAL HIGH (ref 4.8–5.6)
Mean Plasma Glucose: 191.51 mg/dL

## 2018-12-30 LAB — SARS CORONAVIRUS 2 (TAT 6-24 HRS): SARS Coronavirus 2: NEGATIVE

## 2018-12-30 LAB — COMPREHENSIVE METABOLIC PANEL
ALT: 40 U/L (ref 0–44)
AST: 43 U/L — ABNORMAL HIGH (ref 15–41)
Albumin: 3.7 g/dL (ref 3.5–5.0)
Alkaline Phosphatase: 71 U/L (ref 38–126)
Anion gap: 10 (ref 5–15)
BUN: 39 mg/dL — ABNORMAL HIGH (ref 8–23)
CO2: 21 mmol/L — ABNORMAL LOW (ref 22–32)
Calcium: 8.2 mg/dL — ABNORMAL LOW (ref 8.9–10.3)
Chloride: 102 mmol/L (ref 98–111)
Creatinine, Ser: 1.53 mg/dL — ABNORMAL HIGH (ref 0.44–1.00)
GFR calc Af Amer: 39 mL/min — ABNORMAL LOW (ref >60)
GFR calc non Af Amer: 34 mL/min — ABNORMAL LOW (ref >60)
Glucose, Bld: 193 mg/dL — ABNORMAL HIGH (ref 70–99)
Potassium: 3.8 mmol/L (ref 3.5–5.1)
Sodium: 133 mmol/L — ABNORMAL LOW (ref 135–145)
Total Bilirubin: 2.8 mg/dL — ABNORMAL HIGH (ref 0.3–1.2)
Total Protein: 6.8 g/dL (ref 6.5–8.1)

## 2018-12-30 LAB — LACTIC ACID, PLASMA
Lactic Acid, Venous: 2.2 mmol/L (ref 0.5–1.9)
Lactic Acid, Venous: 1.8 mmol/L (ref 0.5–1.9)

## 2018-12-30 LAB — ECHOCARDIOGRAM COMPLETE
Height: 61 in
Weight: 2912 oz

## 2018-12-30 LAB — MRSA PCR SCREENING: MRSA by PCR: NEGATIVE

## 2018-12-30 LAB — TSH: TSH: 0.689 u[IU]/mL (ref 0.350–4.500)

## 2018-12-30 LAB — PROTIME-INR
INR: 1.5 — ABNORMAL HIGH (ref 0.8–1.2)
Prothrombin Time: 17.5 seconds — ABNORMAL HIGH (ref 11.4–15.2)

## 2018-12-30 LAB — CK: Total CK: 159 U/L (ref 38–234)

## 2018-12-30 LAB — APTT: aPTT: 42 seconds — ABNORMAL HIGH (ref 24–36)

## 2018-12-30 MED ORDER — ACETAMINOPHEN 325 MG PO TABS
650.0000 mg | ORAL_TABLET | Freq: Four times a day (QID) | ORAL | Status: DC | PRN
  Administered 2018-12-31 – 2019-01-01 (×2): 650 mg via ORAL
  Filled 2018-12-30 (×2): qty 2

## 2018-12-30 MED ORDER — BISACODYL 10 MG RE SUPP: 10.0000 mg | Freq: Every day | RECTAL | Status: DC | PRN

## 2018-12-30 MED ORDER — ONDANSETRON HCL 4 MG PO TABS: 4.0000 mg | ORAL_TABLET | Freq: Four times a day (QID) | ORAL | Status: DC | PRN

## 2018-12-30 MED ORDER — SODIUM CHLORIDE 0.9 % IV BOLUS
1000.0000 mL | Freq: Once | INTRAVENOUS | Status: AC
  Administered 2018-12-30: 1000 mL via INTRAVENOUS

## 2018-12-30 MED ORDER — MAGIC MOUTHWASH W/LIDOCAINE
5.0000 mL | Freq: Four times a day (QID) | ORAL | Status: AC
  Administered 2018-12-30 – 2018-12-31 (×7): 5 mL via ORAL
  Filled 2018-12-30 (×7): qty 5

## 2018-12-30 MED ORDER — VANCOMYCIN HCL 10 G IV SOLR
1500.0000 mg | Freq: Once | INTRAVENOUS | Status: AC
  Administered 2018-12-30: 1500 mg via INTRAVENOUS
  Filled 2018-12-30: qty 1500

## 2018-12-30 MED ORDER — VANCOMYCIN HCL IN DEXTROSE 1-5 GM/200ML-% IV SOLN: 1000.0000 mg | INTRAVENOUS | Status: DC

## 2018-12-30 MED ORDER — SODIUM CHLORIDE 0.9 % IV BOLUS
500.0000 mL | Freq: Once | INTRAVENOUS | Status: AC
  Administered 2018-12-30: 500 mL via INTRAVENOUS

## 2018-12-30 MED ORDER — INSULIN DEGLUDEC 100 UNIT/ML ~~LOC~~ SOPN: 32.0000 [IU] | PEN_INJECTOR | Freq: Every day | SUBCUTANEOUS | Status: DC

## 2018-12-30 MED ORDER — IPRATROPIUM-ALBUTEROL 20-100 MCG/ACT IN AERS
1.0000 | INHALATION_SPRAY | Freq: Four times a day (QID) | RESPIRATORY_TRACT | Status: DC
  Administered 2018-12-30: 1 via RESPIRATORY_TRACT
  Filled 2018-12-30: qty 4

## 2018-12-30 MED ORDER — DOCUSATE SODIUM 100 MG PO CAPS
100.0000 mg | ORAL_CAPSULE | Freq: Two times a day (BID) | ORAL | Status: DC
  Administered 2018-12-30: 100 mg via ORAL
  Filled 2018-12-30: qty 1

## 2018-12-30 MED ORDER — TBO-FILGRASTIM 480 MCG/0.8ML ~~LOC~~ SOSY
480.0000 ug | PREFILLED_SYRINGE | Freq: Every day | SUBCUTANEOUS | Status: DC
  Administered 2018-12-30 – 2018-12-31 (×2): 480 ug via SUBCUTANEOUS
  Filled 2018-12-30 (×2): qty 0.8

## 2018-12-30 MED ORDER — ACETAMINOPHEN 650 MG RE SUPP: 650.0000 mg | Freq: Four times a day (QID) | RECTAL | Status: DC | PRN

## 2018-12-30 MED ORDER — SODIUM CHLORIDE 0.9 % IV SOLN
INTRAVENOUS | Status: DC
  Administered 2018-12-30 – 2018-12-31 (×3): via INTRAVENOUS

## 2018-12-30 MED ORDER — POLYETHYLENE GLYCOL 3350 17 G PO PACK: 17.0000 g | PACK | Freq: Every day | ORAL | Status: DC | PRN

## 2018-12-30 MED ORDER — HYDROCODONE-ACETAMINOPHEN 5-325 MG PO TABS
1.0000 | ORAL_TABLET | ORAL | Status: DC | PRN
  Administered 2018-12-30 – 2019-01-04 (×13): 2 via ORAL
  Filled 2018-12-30 (×14): qty 2

## 2018-12-30 MED ORDER — VANCOMYCIN HCL IN DEXTROSE 1-5 GM/200ML-% IV SOLN
1000.0000 mg | Freq: Once | INTRAVENOUS | Status: DC
  Filled 2018-12-30: qty 200

## 2018-12-30 MED ORDER — SODIUM CHLORIDE 0.9 % IV BOLUS (SEPSIS)
1000.0000 mL | Freq: Once | INTRAVENOUS | Status: AC
  Administered 2018-12-30: 1000 mL via INTRAVENOUS

## 2018-12-30 MED ORDER — SODIUM CHLORIDE 0.9 % IV SOLN
2.0000 g | Freq: Once | INTRAVENOUS | Status: DC
  Filled 2018-12-30: qty 2

## 2018-12-30 MED ORDER — ALBUMIN HUMAN 5 % IV SOLN
25.0000 g | Freq: Once | INTRAVENOUS | Status: AC
  Administered 2018-12-30: 25 g via INTRAVENOUS
  Filled 2018-12-30: qty 500

## 2018-12-30 MED ORDER — GABAPENTIN 300 MG PO CAPS
600.0000 mg | ORAL_CAPSULE | Freq: Two times a day (BID) | ORAL | Status: DC
  Administered 2018-12-30 – 2019-01-04 (×10): 600 mg via ORAL
  Filled 2018-12-30: qty 2
  Filled 2018-12-30: qty 6
  Filled 2018-12-30 (×9): qty 2

## 2018-12-30 MED ORDER — IPRATROPIUM BROMIDE 0.02 % IN SOLN: 0.5000 mg | Freq: Four times a day (QID) | RESPIRATORY_TRACT | Status: DC

## 2018-12-30 MED ORDER — SODIUM CHLORIDE 0.9 % IV SOLN
2.0000 g | Freq: Once | INTRAVENOUS | Status: AC
  Administered 2018-12-30: 07:00:00 2 g via INTRAVENOUS
  Filled 2018-12-30: qty 2

## 2018-12-30 MED ORDER — GUAIFENESIN ER 600 MG PO TB12
600.0000 mg | ORAL_TABLET | Freq: Two times a day (BID) | ORAL | Status: DC
  Administered 2018-12-30 – 2019-01-04 (×10): 600 mg via ORAL
  Filled 2018-12-30 (×11): qty 1

## 2018-12-30 MED ORDER — METRONIDAZOLE IN NACL 5-0.79 MG/ML-% IV SOLN
500.0000 mg | Freq: Once | INTRAVENOUS | Status: AC
  Administered 2018-12-30: 500 mg via INTRAVENOUS
  Filled 2018-12-30: qty 100

## 2018-12-30 MED ORDER — PANTOPRAZOLE SODIUM 40 MG PO TBEC
40.0000 mg | DELAYED_RELEASE_TABLET | Freq: Every day | ORAL | Status: DC
  Administered 2018-12-30 – 2019-01-04 (×6): 40 mg via ORAL
  Filled 2018-12-30 (×6): qty 1

## 2018-12-30 MED ORDER — CHLORHEXIDINE GLUCONATE CLOTH 2 % EX PADS
6.0000 | MEDICATED_PAD | Freq: Every day | CUTANEOUS | Status: DC
  Administered 2018-12-30 – 2019-01-04 (×6): 6 via TOPICAL

## 2018-12-30 MED ORDER — ALBUTEROL SULFATE (2.5 MG/3ML) 0.083% IN NEBU: 2.5000 mg | INHALATION_SOLUTION | Freq: Four times a day (QID) | RESPIRATORY_TRACT | Status: DC | PRN

## 2018-12-30 MED ORDER — PHENYLEPHRINE HCL-NACL 10-0.9 MG/250ML-% IV SOLN
0.0000 ug/min | INTRAVENOUS | Status: DC
  Administered 2018-12-30: 20 ug/min via INTRAVENOUS
  Administered 2018-12-31: 30 ug/min via INTRAVENOUS
  Filled 2018-12-30 (×2): qty 250

## 2018-12-30 MED ORDER — ACETAMINOPHEN 325 MG PO TABS
650.0000 mg | ORAL_TABLET | Freq: Once | ORAL | Status: AC
  Administered 2018-12-30: 650 mg via ORAL
  Filled 2018-12-30: qty 2

## 2018-12-30 MED ORDER — INSULIN ASPART 100 UNIT/ML ~~LOC~~ SOLN
0.0000 [IU] | Freq: Three times a day (TID) | SUBCUTANEOUS | Status: DC
  Administered 2018-12-30: 1 [IU] via SUBCUTANEOUS
  Administered 2019-01-02: 2 [IU] via SUBCUTANEOUS
  Administered 2019-01-02: 1 [IU] via SUBCUTANEOUS
  Administered 2019-01-02 – 2019-01-03 (×2): 2 [IU] via SUBCUTANEOUS
  Administered 2019-01-03: 1 [IU] via SUBCUTANEOUS
  Administered 2019-01-03 – 2019-01-04 (×3): 2 [IU] via SUBCUTANEOUS

## 2018-12-30 MED ORDER — ALBUTEROL SULFATE (2.5 MG/3ML) 0.083% IN NEBU: 2.5000 mg | INHALATION_SOLUTION | Freq: Four times a day (QID) | RESPIRATORY_TRACT | Status: DC

## 2018-12-30 MED ORDER — FENTANYL CITRATE (PF) 100 MCG/2ML IJ SOLN
50.0000 ug | Freq: Once | INTRAMUSCULAR | Status: AC
  Administered 2018-12-30: 50 ug via INTRAVENOUS
  Filled 2018-12-30: qty 2

## 2018-12-30 MED ORDER — PHYTONADIONE 5 MG PO TABS
2.5000 mg | ORAL_TABLET | Freq: Once | ORAL | Status: AC
  Administered 2018-12-30: 2.5 mg via ORAL
  Filled 2018-12-30 (×2): qty 1

## 2018-12-30 MED ORDER — SODIUM CHLORIDE 0.9 % IV BOLUS: 500.0000 mL | Freq: Once | INTRAVENOUS | Status: DC

## 2018-12-30 MED ORDER — INSULIN GLARGINE 100 UNIT/ML ~~LOC~~ SOLN
32.0000 [IU] | Freq: Every day | SUBCUTANEOUS | Status: DC
  Filled 2018-12-30: qty 0.32

## 2018-12-30 MED ORDER — PROCHLORPERAZINE MALEATE 10 MG PO TABS: 10.0000 mg | ORAL_TABLET | Freq: Four times a day (QID) | ORAL | Status: DC | PRN

## 2018-12-30 MED ORDER — ONDANSETRON HCL 4 MG/2ML IJ SOLN: 4.0000 mg | Freq: Four times a day (QID) | INTRAMUSCULAR | Status: DC | PRN

## 2018-12-30 MED ORDER — SODIUM CHLORIDE 0.9% IV SOLUTION
Freq: Once | INTRAVENOUS | Status: AC
  Administered 2018-12-30: 12:00:00 via INTRAVENOUS

## 2018-12-30 MED ORDER — ROTIGOTINE 4 MG/24HR TD PT24
1.0000 | MEDICATED_PATCH | Freq: Every day | TRANSDERMAL | Status: DC
  Administered 2018-12-31 – 2019-01-04 (×5): 1 via TRANSDERMAL

## 2018-12-30 MED ORDER — SODIUM CHLORIDE 0.9 % IV SOLN
2.0000 g | Freq: Two times a day (BID) | INTRAVENOUS | Status: DC
  Administered 2018-12-30 – 2019-01-01 (×4): 2 g via INTRAVENOUS
  Filled 2018-12-30 (×6): qty 2

## 2018-12-30 MED ORDER — LIDOCAINE-PRILOCAINE 2.5-2.5 % EX CREA
1.0000 "application " | TOPICAL_CREAM | Freq: Every day | CUTANEOUS | Status: DC | PRN
  Filled 2018-12-30: qty 5

## 2018-12-30 MED ORDER — ORAL CARE MOUTH RINSE
15.0000 mL | Freq: Two times a day (BID) | OROMUCOSAL | Status: DC
  Administered 2018-12-30 – 2019-01-04 (×10): 15 mL via OROMUCOSAL

## 2018-12-30 NOTE — Progress Notes (Signed)
Pts SBP in the 80's with MAP's in the 50's. Pt has received approximately 6L of fluid total. Spoke with Dr. Lucile Shutters (PCCM) who recommended placing the pt on Phenylephrine gtt, redraw Lactic acid, and Albumin 25gm x 1. Orders placed and spoke with bedside RN about updated plan. PCCM will see the pt when able.  Arby Barrette AGPCNP-BC, AGNP-C [facility] Pager [phone]

## 2018-12-30 NOTE — Progress Notes (Signed)
PT Cancellation Note  Patient Details Name: DREAM HARMAN MRN: [id number] DOB: 1946/01/10   Cancelled Treatment:    Reason Eval/Treat Not Completed: Medical issues which prohibited therapy--low BP per RN. Will hold PT today and check back another day.   Weston Anna, PT [facility] Pager: [phone] Office: [phone]

## 2018-12-30 NOTE — ED Provider Notes (Signed)
LACERATION REPAIR Performed by: Janeece Fitting Authorized by: Janeece Fitting Consent: Verbal consent obtained. Risks and benefits: risks, benefits and alternatives were discussed Consent given by: patient Patient identity confirmed: provided demographic data Prepped and Draped in normal sterile fashion Wound explored  Laceration Location: right parietal  Laceration Length: 5 cm  No Foreign Bodies seen or palpated  Anesthesia: local infiltration  Local anesthetic: none  Anesthetic total: no  Irrigation method: syringe extensive  Amount of cleaning: sterile standard  Skin closure: close   Number of staples: 5  Patient tolerance: Patient tolerated the procedure well with no immediate complications.    Janeece Fitting, PA-C 12/30/18 0940    Dorie Rank, MD 12/30/18 Karl Bales

## 2018-12-30 NOTE — H&P (Addendum)
[facility] History and Physical  Olivia Benson XBJ:[id number] DOB: May 24, 1945 DOA: 12/30/2018  Referring physician: ED  PCP: Gregor Hams, FNP   Chief Complaint: Fall  HPI: Olivia Benson is a 73 y.o. 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  Patient does not remember the event and the circumstances of her fall.  Patient lives with her husband at home who noticed her fall and EMS was called in.  Patient loss of consciousness for several minutes with incontinence.  Of note patient was also having fever at home at 100.8 F but denies any chills with shortness of breath cough sore throat or dysphagia.  Patient denied any exposure to COVID-19.  She does have mild nausea and poor oral intake from chemotherapy which was completed last week.  Patient denies any urinary urgency, frequency or dysuria.  Denies any constipation or diarrhea.  She does have impaired appetite.  Patient denies any skin rashes abscesses or Port-A-Cath site infection.  Patient denies history of significant cardiovascular disease.  Patient complains of mild discomfort in her head after hitting her head.  Patient has a walker and cane at home but does not usually use.  She lives with her husband at home.  Patient denies any headache or vomiting at the time of my examination.  ED Course: In the ED, patient received 3 L of normal saline and broad-spectrum antibiotics.  Covid test was sent which is pending.  Blood cultures have been sent as well.  UA was pending.  Chest x-ray was negative for any acute findings.  CT scan of the head showed a small amount of subdural hematoma without mass-effect and small amount of subarachnoid hemorrhage in the right frontal region.   X-ray of the lumbar spine shows degenerative changes.  CT of the cervical spine without any bony abnormality.  Review of Systems:  All systems were reviewed and were negative unless otherwise mentioned in the HPI  Past Medical History:  Diagnosis Date  . Asthma   . Cirrhosis ([place])   . Complication of anesthesia    slow to wake up / n & v  . Diabetes mellitus   . Elevated LFTs   . Family history of adverse reaction to anesthesia    multiple family members have had problems such as slow to wake / "flat lined" /  ventilator    . Frequency of urination   . Glaucoma, narrow-angle   . History of skin cancer   . Hypertension   . Non-ST elevated myocardial infarction (non-STEMI) [facility]) Sept 2012   Mild elevation in troponin and CK MB but no evidence of coronary disease at time of cath; per patient " i had undiagnosed heart event but not a heart attack"   . Obesity   . Ovarian cancer ([place]) 11/22/2013   Disseminated ovarian cancer; 09-25-2018   "my cancer is back and im restarting chemo on tuesday 09-25-2018]  . Overactive bladder   . PONV (postoperative nausea and vomiting)    no problems with feb  egd   . Portal hypertension ([place])   . Restless leg syndrome   . Tingling    hands and feet   Past Surgical History:  Procedure Laterality Date  . ABDOMINAL ULTRASOUND  Sept 2012   No gallbladder disease, + fatty liver  .  CARDIAC CATHETERIZATION  10/25/2010   EF 60%; Normal coronaries  . COLONOSCOPY  10/18/2005  . COLONOSCOPY WITH PROPOFOL N/A 09/28/2018   Procedure: COLONOSCOPY WITH PROPOFOL;  Surgeon: Ronnette Juniper, MD;  Location: [facility];  Service: Gastroenterology;  Laterality: N/A;  . COSMETIC SURGERY  2003  . DENTAL SURGERY    . ESOPHAGEAL BANDING  02/10/2018   Procedure: ESOPHAGEAL BANDING;  Surgeon: Ronnette Juniper, MD;  Location: [facility];  Service: Gastroenterology;;  . ESOPHAGEAL BANDING N/A 03/23/2018   Procedure: ESOPHAGEAL BANDING;  Surgeon: Ronnette Juniper, MD;  Location: [facility];  Service: Gastroenterology;  Laterality: N/A;  . ESOPHAGEAL BANDING N/A 09/28/2018   Procedure: ESOPHAGEAL BANDING;  Surgeon: Ronnette Juniper, MD;  Location: [facility];  Service: Gastroenterology;  Laterality: N/A;  . ESOPHAGOGASTRODUODENOSCOPY N/A 03/23/2018   Procedure: ESOPHAGOGASTRODUODENOSCOPY (EGD);  Surgeon: Ronnette Juniper, MD;  Location: [facility];  Service: Gastroenterology;  Laterality: N/A;  . ESOPHAGOGASTRODUODENOSCOPY (EGD) WITH PROPOFOL N/A 02/10/2018   Procedure: ESOPHAGOGASTRODUODENOSCOPY (EGD) WITH PROPOFOL;  Surgeon: Ronnette Juniper, MD;  [facility]: [facility];  Service: Gastroenterology;  Laterality: N/A;  . ESOPHAGOGASTRODUODENOSCOPY (EGD) WITH PROPOFOL N/A 09/28/2018   Procedure: ESOPHAGOGASTRODUODENOSCOPY (EGD) WITH PROPOFOL;  Surgeon: Ronnette Juniper, MD;  [facility]: [facility];  Service: Gastroenterology;  Laterality: N/A;  . EYE SURGERY Bilateral 09/19/14 R eye, 08/2014 L eye   open angles in both eyes d/t narrow angle glaucoma  . LAPAROTOMY N/A 03/29/2014   Procedure: EXPLORATORY LAPAROTOMY;  Surgeon: Everitt Amber, MD;  Location: [facility];  Service: Gynecology;  Laterality: N/A;  . ORIF ANKLE FRACTURE Right 03/09/2018   Procedure: OPEN REDUCTION INTERNAL FIXATION (ORIF) RIGHT LATERAL MALLEOLUS;  Surgeon: Leandrew Koyanagi, MD;  Location: [place] [facility];  Service: Orthopedics;  Laterality: Right;  . PARTIAL HYSTERECTOMY  1988  . POLYPECTOMY  09/28/2018   Procedure: POLYPECTOMY;  Surgeon: Ronnette Juniper, MD;  Location: [facility];  Service: Gastroenterology;;  . SALPINGOOPHORECTOMY Bilateral 03/29/2014   Procedure: BILATERAL SALPINGO OOPHORECTOMY, OMENTECTOMY, DEBULKING;  Surgeon: Everitt Amber, MD;  Location: [facility];  Service: Gynecology;  Laterality: Bilateral;  . TONSILLECTOMY  1971  . TUBAL LIGATION    . [facility]  11/24/2009    Social History:  reports that she quit smoking about 36 years ago. Her smoking use included cigarettes. She has a 1.00 pack-year smoking history. She has never  used smokeless tobacco. She reports that she does not drink alcohol or use drugs.  Allergies  Allergen Reactions  . Carboplatin Anaphylaxis  . Shellfish Allergy Anaphylaxis    HER THROAT SWELLS UP AND CLOSES  . Benadryl [Diphenhydramine Hcl] Other (See Comments)    Restless legs   . Penicillins Rash    DID THE REACTION INVOLVE: Swelling of the face/tongue/throat, SOB, or low BP? No Sudden or severe rash/hives, skin peeling, or the inside of the mouth or nose? No Did it require medical treatment? No When did it last happen?Childhood allergy If all above answers are "NO", may proceed with cephalosporin use.   . Tape Itching    Red itch with some tapes    Family History  Problem Relation Age of Onset  . Asthma Mother   . Diabetes Mother   . Hypertension Mother   . Allergies Mother   . Rheum arthritis Mother   . Bladder Cancer Father 43       former heavy smoker  . Hypertension Father   . Stroke Father   . Breast cancer Sister 74  . Diabetes Sister  x 3  . Allergies Sister   . Asthma Sister        x 2   . Breast cancer Maternal Aunt 85  . Breast cancer Cousin 36       (maternal) bilateral cancer  . Aneurysm Son 74  . Diabetes Brother   . Allergies Brother   . Asthma Brother      Prior to Admission medications   Medication Sig Start Date End Date Taking? Authorizing Provider  cholecalciferol (VITAMIN D) 1000 units tablet Take 1,000 Units by mouth at bedtime.     [provider]  dexamethasone (DECADRON) 4 MG tablet Start the day before Taxotere, take 1 tablet in the morning, and then 1 tablet daily after chemo for 2 days. Pls dispense 30 tabs 12/15/18   [name], MD  empagliflozin (JARDIANCE) 10 MG TABS tablet Take 10 mg by mouth daily.    [provider]  gabapentin (NEURONTIN) 300 MG capsule TAKE 2 CAPSULES(600 MG) BY MOUTH TWICE DAILY Patient taking differently: Take 600 mg by mouth 2 (two) times daily.  06/02/18   [name], MD   hydrochlorothiazide (MICROZIDE) 12.5 MG capsule Take 12.5 mg by mouth daily after breakfast.  05/21/17   [provider]  HYDROcodone-acetaminophen (NORCO/VICODIN) 5-325 MG tablet Take 1 tablet by mouth every 6 (six) hours as needed for moderate pain. 12/15/18   [name], MD  insulin degludec (TRESIBA FLEXTOUCH) 100 UNIT/ML SOPN FlexTouch Pen Inject 32 Units into the skin daily after breakfast.    [provider]  lidocaine-prilocaine (EMLA) cream Apply to Porta-cath  1-2 hours prior to access as directed. Patient taking differently: Apply 1 application topically daily as needed (port access).  11/13/17   [name], MD  nadolol (CORGARD) 20 MG tablet Take 1 tablet (20 mg total) by mouth daily. Patient taking differently: Take 20 mg by mouth at bedtime.  03/23/18 03/23/19  Ronnette Juniper, MD  ondansetron (ZOFRAN) 8 MG tablet Take 1 tablet (8 mg total) by mouth every 8 (eight) hours as needed. Start on the third day after chemotherapy. 09/18/18   [name], MD  pantoprazole (PROTONIX) 40 MG tablet Take 1 tablet (40 mg total) by mouth daily. 07/09/18   [name], MD  prochlorperazine (COMPAZINE) 10 MG tablet Take 1 tablet (10 mg total) by mouth every 6 (six) hours as needed (Nausea or vomiting). 09/18/18   [name], MD  rotigotine (NEUPRO) 4 MG/24HR APPLY 1 PATCH EXTERNALLY TO THE SKIN DAILY Patient taking differently: Place 1 patch onto the skin daily.  05/14/18   Tat, Eustace Quail, DO  valsartan (DIOVAN) 40 MG tablet Take 40 mg by mouth daily.    [provider]    Physical Exam: Vitals:   12/30/18 0635 12/30/18 0700 12/30/18 0803 12/30/18 0807  BP:  (!) 91/49 (!) 113/56   Pulse:  82 79 79  Resp:  15 19   Temp:      TempSrc:      SpO2: 96% 100% 96% 96%   Wt Readings from Last 3 Encounters:  12/15/18 86.4 kg  11/24/18 84.7 kg  10/27/18 85.5 kg   There is no height or weight on file to calculate BMI.  General:  Average built, not in obvious distress HENT:  Normocephalic, pupils equally reacting to light and accommodation.  No scleral pallor or icterus noted. Oral mucosa is dry with chapped hemorrhagic lips.  Scalp laceration status post suture on the right side. Chest:  Clear breath sounds.  Diminished breath sounds bilaterally. No crackles or wheezes.  CVS: S1 &S2 heard. No murmur.  Regular rate and rhythm. Abdomen: Soft nontender bowel sounds present.  Prior surgical scar noted. Extremities: No cyanosis, clubbing or edema.  Peripheral pulses are palpable. Psych: Alert, awake and oriented, normal mood CNS:  No cranial nerve deficits.  Power equal in all extremities.   No cerebellar signs.   Skin: Warm and dry.  No rashes noted.  Labs on Admission:   CBC: Recent Labs  Lab 12/30/18 0720  WBC 1.5*  NEUTROABS 0.1*  HGB 11.6*  HCT 35.9*  MCV 96.8  PLT 83*    Basic Metabolic Panel: Recent Labs  Lab 12/30/18 0720  NA 133*  K 3.8  CL 102  CO2 21*  GLUCOSE 193*  BUN 39*  CREATININE 1.53*  CALCIUM 8.2*    Liver Function Tests: Recent Labs  Lab 12/30/18 0720  AST 43*  ALT 40  ALKPHOS 71  BILITOT 2.8*  PROT 6.8  ALBUMIN 3.7   No results for input(s): LIPASE, AMYLASE in the last 168 hours. No results for input(s): AMMONIA in the last 168 hours.  Cardiac Enzymes: Recent Labs  Lab 12/30/18 0720  CKTOTAL 159    BNP (last 3 results) No results for input(s): BNP in the last 8760 hours.  ProBNP (last 3 results) No results for input(s): PROBNP in the last 8760 hours.  CBG: No results for input(s): GLUCAP in the last 168 hours.  Lipase  No results found for: LIPASE   Urinalysis    Component Value Date/Time   COLORURINE YELLOW 10/22/2017 [place] 10/22/2017 1041   LABSPEC 1.015 10/22/2017 1041   PHURINE 5.0 10/22/2017 1041   GLUCOSEU NEGATIVE 10/22/2017 1041   HGBUR MODERATE (A) 10/22/2017 1041   BILIRUBINUR NEGATIVE 10/22/2017 1041   KETONESUR NEGATIVE 10/22/2017 1041   PROTEINUR NEGATIVE  04/08/2018 0940   UROBILINOGEN 0.2 03/22/2014 0917   NITRITE POSITIVE (A) 10/22/2017 1041   LEUKOCYTESUR SMALL (A) 10/22/2017 1041     Drugs of Abuse  No results found for: LABOPIA, COCAINSCRNUR, LABBENZ, AMPHETMU, THCU, LABBARB    Radiological Exams on Admission: Dg Lumbar Spine Complete  Result Date: 12/30/2018 CLINICAL DATA:  Recent fall with low back pain, initial encounter EXAM: LUMBAR SPINE - COMPLETE 4+ VIEW COMPARISON:  None. FINDINGS: Five lumbar type vertebral bodies are well visualized. Osteophytic changes are noted worst at L2-3 and L4-5. Facet hypertrophic changes are noted. No anterolisthesis is seen. No soft tissue abnormality is noted. IMPRESSION: Mild degenerative change without acute abnormality. Electronically Signed   By: Inez Catalina M.D.   On: 12/30/2018 08:51   Ct Head Wo Contrast  Result Date: 12/30/2018 CLINICAL DATA:  Pain following fall.  History of ovarian carcinoma EXAM: CT HEAD WITHOUT CONTRAST CT CERVICAL SPINE WITHOUT CONTRAST TECHNIQUE: Multidetector CT imaging of the head and cervical spine was performed following the standard protocol without intravenous contrast. Multiplanar CT image reconstructions of the cervical spine were also generated. COMPARISON:  None. FINDINGS: CT HEAD FINDINGS Brain: The ventricles are normal in size and configuration. There is a small amount of hemorrhage in the anterior to mid falx consistent with a small falcine subdural hematoma. There is no appreciable mass effect in this area. A small amount of subarachnoid hemorrhage is noted in the left paramedian frontal region near the falcine subdural hematoma. There is equivocal small amount of right frontal subarachnoid hemorrhage as well, not causing mass effect. No other foci of hemorrhage identified.  No midline shift. Beyond the mild hemorrhage within the falx, there is no other extra-axial fluid collection. No findings indicative of focal infarct. No intracranial edema evident.  Vascular: No hyperdense vessel. There are foci of arterial vascular calcification in the carotid siphon regions. Skull: The bony calvarium appears intact. Sinuses/Orbits: Visualized paranasal sinuses are clear. There is mild rightward deviation of the nasal septum. Orbits appear symmetric bilaterally. Other: Mastoid air cells are clear. CT CERVICAL SPINE FINDINGS Alignment: There is no appreciable spondylolisthesis. Skull base and vertebrae: Skull base and craniocervical junction regions appear normal. No fractures evident. Bones are somewhat osteoporotic. No blastic or lytic bone lesions. Soft tissues and spinal canal: Prevertebral soft tissues and predental space regions are normal. There is no evident cord or canal hematoma. No paraspinous lesions are demonstrable. Disc levels: There is moderate disc space narrowing at C5-6 and C6-7. There are anterior osteophytes at C5, C6, and C7. There is facet hypertrophy at multiple levels, most severe at C3-4 on the right. There is exit foraminal narrowing due to bony hypertrophy at C5-6 and C6-7 bilaterally. No frank disc extrusion or stenosis. Upper chest: Visualized upper lung regions are clear. Other: None IMPRESSION: Head CT: 1. Small amount of falcine subdural hematoma without appreciable mass effect. Thickness of the hemorrhage within this area measures 3 mm. No other extra-axial fluid. 2. Small amount of subarachnoid hemorrhage in the right frontal and paramedian left frontal lobe regions without mass effect or edema. 3.  Elsewhere brain parenchyma appears unremarkable. 4.  There are foci of arterial vascular calcification. CT cervical spine: 1.  No fracture or spondylolisthesis. 2. Osteoarthritic change at several levels. No disc extrusion or stenosis evident. 3.  No blastic or lytic bone lesions.  Bone somewhat osteoporotic. Critical Value/emergent results were called by telephone at the time of interpretation on 12/30/2018 at 8:27 am to providerDr. Tomi Bamberger, ED  physician, who verbally acknowledged these results. Electronically Signed   By: Lowella Grip III M.D.   On: 12/30/2018 08:27   Ct Cervical Spine Wo Contrast  Result Date: 12/30/2018 CLINICAL DATA:  Pain following fall.  History of ovarian carcinoma EXAM: CT HEAD WITHOUT CONTRAST CT CERVICAL SPINE WITHOUT CONTRAST TECHNIQUE: Multidetector CT imaging of the head and cervical spine was performed following the standard protocol without intravenous contrast. Multiplanar CT image reconstructions of the cervical spine were also generated. COMPARISON:  None. FINDINGS: CT HEAD FINDINGS Brain: The ventricles are normal in size and configuration. There is a small amount of hemorrhage in the anterior to mid falx consistent with a small falcine subdural hematoma. There is no appreciable mass effect in this area. A small amount of subarachnoid hemorrhage is noted in the left paramedian frontal region near the falcine subdural hematoma. There is equivocal small amount of right frontal subarachnoid hemorrhage as well, not causing mass effect. No other foci of hemorrhage identified. No midline shift. Beyond the mild hemorrhage within the falx, there is no other extra-axial fluid collection. No findings indicative of focal infarct. No intracranial edema evident. Vascular: No hyperdense vessel. There are foci of arterial vascular calcification in the carotid siphon regions. Skull: The bony calvarium appears intact. Sinuses/Orbits: Visualized paranasal sinuses are clear. There is mild rightward deviation of the nasal septum. Orbits appear symmetric bilaterally. Other: Mastoid air cells are clear. CT CERVICAL SPINE FINDINGS Alignment: There is no appreciable spondylolisthesis. Skull base and vertebrae: Skull base and craniocervical junction regions appear normal. No fractures evident. Bones are somewhat osteoporotic. No blastic or lytic bone lesions.  Soft tissues and spinal canal: Prevertebral soft tissues and predental space  regions are normal. There is no evident cord or canal hematoma. No paraspinous lesions are demonstrable. Disc levels: There is moderate disc space narrowing at C5-6 and C6-7. There are anterior osteophytes at C5, C6, and C7. There is facet hypertrophy at multiple levels, most severe at C3-4 on the right. There is exit foraminal narrowing due to bony hypertrophy at C5-6 and C6-7 bilaterally. No frank disc extrusion or stenosis. Upper chest: Visualized upper lung regions are clear. Other: None IMPRESSION: Head CT: 1. Small amount of falcine subdural hematoma without appreciable mass effect. Thickness of the hemorrhage within this area measures 3 mm. No other extra-axial fluid. 2. Small amount of subarachnoid hemorrhage in the right frontal and paramedian left frontal lobe regions without mass effect or edema. 3.  Elsewhere brain parenchyma appears unremarkable. 4.  There are foci of arterial vascular calcification. CT cervical spine: 1.  No fracture or spondylolisthesis. 2. Osteoarthritic change at several levels. No disc extrusion or stenosis evident. 3.  No blastic or lytic bone lesions.  Bone somewhat osteoporotic. Critical Value/emergent results were called by telephone at the time of interpretation on 12/30/2018 at 8:27 am to providerDr. Tomi Bamberger, ED physician, who verbally acknowledged these results. Electronically Signed   By: Lowella Grip III M.D.   On: 12/30/2018 08:27   Dg Chest Port 1 View  Result Date: 12/30/2018 CLINICAL DATA:  Fever EXAM: PORTABLE CHEST 1 VIEW COMPARISON:  2015 FINDINGS: Right chest wall port catheter tip overlies the central SVC. Lungs are clear. No pleural effusion. Cardiomediastinal contours are within normal limits with normal heart size. IMPRESSION: No active disease. Electronically Signed   By: Macy Mis M.D.   On: 12/30/2018 07:27    EKG: Personally reviewed by me which shows sinus rhythm.  Assessment/Plan Principal Problem:   Fall Active Problems:   Essential  hypertension   Disseminated ovarian cancer ([place])   Diabetes mellitus with diabetic neuropathy, with long-term current use of insulin ([place])   Liver cirrhosis secondary to NASH ([place])   Thrombocytopenia ([place])  Will admit the patient in stepdown unit for neutropenic fever, sepsis, syncope with intracranial hemorrhage.  Fever with elevated lactate, hypotension and leukopenia/severe neutropenia patient undergoing chemotherapy with acute kidney injury history of sepsis with neutropenic fever..  Patient had temperature of 102F on presentation with hypotension which responded to IV fluids.  Unknown source at this time.  Chest x-ray was negative.  UA is pending.  COVID-19 is pending as well.  At this time, patient has received vancomycin and cefepime.  We will continue with same.  De-escalate antibiotics depending on clinical response and microbiology.  Notified Dr. Alvy Bimler, oncology and will likely receive GCSF.  Syncope likely leading to fall resulting in traumatic subdural hematoma/subarachnoid hemorrhage.  No midline shift or focal neurological deficit.  Had syncope.  Neurosurgery Dr. Saintclair Halsted has seen the patient.  Recommend vitamin K/FFP.  Patient has received vitamin K in the ED..  Will follow recommendations.  Avoid blood thinners. Keep head of bed elevated, neurochecks.  Acute kidney injury likely multifactorial secondary to volume depletion, poor oral intake, diuretics and sepsis.  We will continue with IV fluid hydration.  Check BMP in a.m. patient is on HCTZ at home.  Will hold HCTZ for now.  Hold valsartan.  Leukopenia, thrombocytopenia.  Patient is on chemotherapy.  Immunocompromised patient.  Continue with broad-spectrum antibiotic at this time.  Closely monitor.  History of ovarian cancer with peritoneal carcinomatosis.  Patient is on chemo as outpatient.  Currently with fever and neutropenia.  Patient follows up with Dr. Meriel Flavors oncology.  History of nonalcoholic cirrhosis of liver with  ascites.  Currently abdomen is not distended.  Compensated.  Bilirubin is elevated.  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  Patient uses insulin regimen at home.  Med rec is still pending.  We will continue with sliding scale insulin diabetic diet while in hospital  Debility deconditioning likely secondary to volume depletion, disseminated cancer and ongoing chemotherapy.  She does have neuropathy as well.  Will get PT OT evaluation.  History of asthma.  Not on oxygen.  Continue bronchodilators.  Incentive spirometry.  DVT Prophylaxis: SCD due to thrombocytopenia and intracranial hemorrhage  Consultant: Neurosurgery  Oncology Dr Alvy Bimler  Code Status: Full code  Microbiology blood culture sent from the ED  Antibiotics: vancomycin and cefepime  Family Communication:  Patients' condition and plan of care including tests being ordered have been discussed with the patient who indicate understanding and agree with the plan.  Disposition Plan: Home with home health  Severity of Illness: The appropriate patient status for this patient is INPATIENT. Inpatient status is judged to be reasonable and necessary in order to provide the required intensity of service to ensure the patient's safety. The patient's presenting symptoms, physical exam findings, and initial radiographic and laboratory data in the context of their chronic comorbidities is felt to place them at high risk for further clinical deterioration. Furthermore, it is not anticipated that the patient will be medically stable for discharge from the hospital within 2 midnights of admission.I certify that at the point of admission it is my clinical judgment that the patient will require inpatient hospital care spanning beyond 2 midnights from the point of admission due to high intensity of service, high risk for further deterioration and high frequency of surveillance required.   Signed, Flora Lipps, MD [facility]  12/30/2018

## 2018-12-30 NOTE — Progress Notes (Signed)
SYBILLA MALHOTRA   DOB:1946/01/29   TK#:[id number]    ASSESSMENT & PLAN:  Recurrent primary peritoneal carcinomatosis She had received chemotherapy on November 9, complicated by neutropenic phase. 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  Her baseline platelet count is around 80,000. She does not need transfusion of blood today  Subdural hematoma/intracranial hemorrhage secondary to fall I agree with transfusion support but I want to set an expectation, due to liver cirrhosis and splenomegaly, even with aggressive platelet transfusion, I do not expect her platelet count to be normalized  Acute renal failure Likely due to dehydration Continue supportive care with hydration  Neutropenic fever Cultures are pending Chest x-ray is negative The most likely source is likely oropharynx given her complaints at home with mucositis pain Continue broad-spectrum IV antibiotics for now  Oral mucositis secondary to chemotherapy I have prescribed Magic mouthwash  Code Status Full code  Goals of care Until resolution of neutropenic fever/sepsis  Discharge planning She will likely be in the hospital for 3 to 5 days I have spoken with her husband and updated him over the telephone I will return to check on her tomorrow  All questions were answered. The patient knows to call the clinic with any problems, questions or concerns.   [name], MD 12/30/2018 11:57 AM  Subjective:  I was consulted by her husband and primary service for further management on this patient who was admitted for neutropenic fever, potential early sepsis and fall at home.  She is well-known to me.  She have received chemotherapy recently.  The patient is currently in isolation at the stepdown unit.  I am unable to  interact with her due to pending Covid test and inability to get adequate PPE I spoke with her husband over the telephone to get additional history and reviewed her chart According to her husband, she has been feeling weak over the last 24 to 48 hours since her recent chemotherapy.  Her intake has been somewhat poor and she has been complaining about some mouth sores/sore throat.  He did not report any fever at home.  This morning, when she tried to get up, she fell.  She was complaining of weakness prior to the fall.  Her husband called 11 and she was brought to the emergency room for further evaluation.  CT scan showed hematoma and mild hemorrhage but no evidence of mass-effect.  Transfusion of platelet has been ordered.  In the emergency room, she was noted to have high-grade fever.  Cultures are pending.  She was started on broad-spectrum IV antibiotics. Blood work showed severe pancytopenia with acute renal failure and lactic acidosis. 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 Had allergic reaction to carboplatin. Avastin was stopped due to GI hemorrhage   Primary peritoneal carcinomatosis ([place])  09/06/2011 Imaging   CT findings consistent with cirrhotic changes involving the liver.  No worrisome liver mass.  There are associated portal venous collaterals and splenomegaly consistent with portal venous hypertension. 2.  No other significant upper abdominal findings   11/08/2013 Imaging   US showed ascites   11/08/2013 Initial Diagnosis   Patient had ~ 2 months of some urinary incontinence, saw Dr  Taavon in 10-2013, with pelvic mass on exam    11/12/2013 Imaging   There are findings of progressive hepatic cirrhosis with a large amount of ascites and mild splenomegaly. There are venous collaterals present. 2. There is abnormal thickening of the peritoneal  surface along the left mid and lower abdominal wall. There is abnormal soft tissue density in the pelvis as well which could reflect the clinically suspected ovarian malignancy but the findings are nondiagnostic. 3. There is a new small left pleural effusion. 4. There is no acute bowel abnormality.   11/18/2013 Imaging   Successful ultrasound guided diagnostic and therapeutic paracentesis yielding 4.1 liters of ascites.    11/18/2013 Pathology Results   PERITONEAL/ASCITIC FLUID (SPECIMEN 1 OF 1 COLLECTED 11/18/13): SEROUS CARCINOMA, PLEASE SEE COMMENT.   12/01/2013 Tumor Marker   Patient's tumor was tested for the following markers: CA125 Results of the tumor marker test revealed 1938   12/03/2013 Imaging   Successful ultrasound-guided therapeutic paracentesis yielding 3.6 liters of peritoneal fluid.   12/07/2013 - 05/30/2014 Chemotherapy   She received 3 cycles of carboplatin and Taxol, interrupted cycle 4 for surgery.  She subsequently completed 3 more cycles of chemotherapy after surgery   12/20/2013 Imaging   Successful ultrasound-guided diagnostic and therapeutic paracentesis yielding 2.2 liters of peritoneal fluid   12/20/2013 Pathology Results   PERITONEAL/ASCITIC FLUID(SPECIMEN 1 OF 1 COLLECTED 12/20/13): MALIGNANT CELLS CONSISTENT WITH SEROUS CARCINOMA   01/13/2014 Tumor Marker   Patient's tumor was tested for the following markers: CA125 Results of the tumor marker test revealed 227   02/07/2014 Tumor Marker   Patient's tumor was tested for the following markers: CA125 Results of the tumor marker test revealed 130   02/15/2014 Genetic Testing   Genetics testing from 02-2014 normal ([employer] panel)   02/23/2014 Imaging   Interval decrease in ascites. 2. Morphologic changes in the liver consistent with cirrhosis. 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 Portal vein remains patent at this time. 3. Persistent but decreased abnormal soft tissue  attenuation tracking in the omental fat. This may be secondary to interval improvement in metastatic disease. 4. Peritoneal thickening seen in the left abdomen and pelvis on the previous study has decreased and nearly resolved in the interval. This also suggests interval improvement in metastatic disease.    03/07/2014 Procedure   Ultrasound and fluoroscopically guided right internal jugular single lumen power port catheter insertion. Tip in the SVC/RA junction. Catheter ready for use.   03/17/2014 Tumor Marker   Patient's tumor was tested for the following markers: CA125 Results of the tumor marker test revealed 45   03/29/2014 Pathology Results   1. Omentum, resection for tumor - HIGH GRADE SEROUS CARCINOMA, SEE COMMENT. 2. Ovary and fallopian tube, right - HIGH GRADE SEROUS CARCINOMA, SEE COMMENT. 3. Ovary and fallopian tube, left - HIGH GRADE SEROUS CARCINOMA, SEE COMMENT. Diagnosis Note 1. Nests and clusters of malignant cells are invading the omental tissue (part #1) with associated fibrosis. The cells are pleomorphic with prominent nucleoli. There are scattered psammoma bodies. The ovaries are atrophic and exhibit multiple foci of surface based invasive carcinoma and associated fibrosis. The fallopian tubes have a few foci of carcinoma, also superficially located. There are no precursor lesions noted in the ovaries or fallopian tubes (the entire tubes and ovaries were submitted for evaluation). While there is some retraction artifact, there are several foci suspicious for lymphovascular invasion. 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 Given the fibrosis, there does appear to be a small amount of treatment effect, however, there is abundant residual tumor.    03/29/2014 Surgery   Procedure(s) Performed: 1. Exploratory laparotomy with bilateral  salpingo-oophorectomy, omentectomy radical tumor debulking for ovarian cancer .  Surgeon: Thereasa Solo, MD.  Assistant Surgeon: Lahoma Crocker, M.D. Assistant: (an MD assistant was necessary for tissue manipulation, retraction and positioning due to the complexity of the case and hospital policies).  Operative Findings: 10cm omental cake from hepatic to splenic flexure, densely adherent to transverse colon. Milliary studding of tumor implants (<20m, too numerous in number to count) adherent to the mesentery of the small bowel and small bowel wall. Small volume (100cc) ascites. Small ovaries bilaterally, densely adherent to the pelvic cul de sac. Left ovary and tube densely adherent to sigmoid colon. Cirrhotic liver with hepatomegaly and splenomegaly.     This represented an optimal cytoreduction (R1) with visible disease residual on the bowel wall and mesentery (millial, <375mimplants).     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 2. 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    08/18/2014 Tumor Marker   Patient's tumor was tested for the following markers: CA125 Results of the tumor marker test revealed 16   10/13/2014 Tumor Marker   Patient's tumor was tested for the following markers: CA125 Results of the tumor marker test revealed 14   11/21/2014 Tumor Marker   Patient's tumor was tested for the following markers: CA125 Results of the tumor marker test revealed 16   12/28/2014 Tumor Marker    Patient's tumor was tested for the following markers: CA125 Results of the tumor marker test revealed 762   02/27/2015 Tumor Marker   Patient's tumor was tested for the following markers: CA125 Results of the tumor marker test revealed 20.2   03/22/2015 Imaging   Filler appearance to the prior exam, with very subtle fluid tracking along portions of the liver, and very minimal nodularity along the right paracolic gutter representing residua from the prior peritoneal cancer. The current abnormalities could simply be post therapy findings rather than necessarily representing residual malignancy. No new or enlarging lesions are identified. 2. Hepatic cirrhosis and splenomegaly. There is some gastric varices suggesting portal venous hypertension. 3. Left foraminal stenosis at L4-5 due to spurring. There is likely also some central narrowing of the thecal sac at this level. 4. Bibasilar scarring. 5. Chronic mild left mid kidney scarring   03/27/2015 Tumor Marker   Patient's tumor was tested for the following markers: CA125 Results of the tumor marker test revealed 25.3   04/24/2015 Imaging   Disc bulge L2-3 with mild spinal stenosis and right foraminal encroachment. 2. Disc bulge L3-4 with mild spinal stenosis. 3. Multifactorial spinal, left lateral recess and foraminal stenosis L4-5. There is grade 1 anterolisthesis without evident dynamic instability.   05/29/2015 Tumor Marker   Patient's tumor was tested for the following markers: CA125  Results of the tumor marker test revealed 29.9   08/21/2015 Tumor Marker   Patient's tumor was tested for the following markers: CA125 Results of the tumor marker test revealed 45   09/18/2015 Tumor Marker   Patient's tumor was tested for the following markers: CA125 Results of the tumor marker test revealed 47   09/27/2015 Imaging   New small bowel mesenteric nodule and enlarged left external iliac lymph node, highly worrisome for metastatic disease. 2. Cirrhosis  and splenomegaly   10/10/2015 Tumor Marker   Patient's tumor was tested for the following markers: CA125 Results of the tumor marker test revealed 53.4   10/10/2015 - 12/11/2015 Chemotherapy   She received 4 cycles of carboplatin only   11/20/2015 Tumor Marker   Patient's tumor was tested for the following markers: CA125 Results of the tumor marker test revealed 41.3   12/20/2015 Imaging   CT: Mild left external iliac lymphadenopathy is slightly decreased. Mildly enlarged lower mesenteric nodule is slightly decreased. 2. No new or progressive metastatic disease in the abdomen or pelvis. 3. Cirrhosis. Stable subcentimeter hypodense left liver lobe lesion. No new liver lesions. 4. Stable mild splenomegaly.  No ascites. 5. Mild sigmoid diverticulosis.   01/01/2016 -  Anti-estrogen oral therapy   She was placed on tamoxifen. Had letrozole initially but switched to tamoxifen due to poor tolerance   01/15/2016 Tumor Marker   Patient's tumor was tested for the following markers: CA125 Results of the tumor marker test revealed 31.4   02/19/2016 Tumor Marker   Patient's tumor was tested for the following markers: CA125 Results of the tumor marker test revealed 36.5   05/13/2016 Imaging   No evidence of disease progression within the abdomen or pelvis. Previously noted small central mesenteric nodule and left external iliac node appear slightly smaller. 2. Stable changes of hepatic cirrhosis and portal hypertension with associated splenomegaly. No new or enlarging hepatic lesions are identified. 3. No acute findings.   05/13/2016 Tumor Marker   Patient's tumor was tested for the following markers: CA125 Results of the tumor marker test revealed 41.2   06/24/2016 Tumor Marker   Patient's tumor was tested for the following markers: CA125 Results of the tumor marker test revealed 47.3   08/20/2016 Imaging   1. The left external iliac lymph node is slightly increased in size compared to the prior exam,  currently 1.1 cm and previously 0.9 cm in diameter. 2. Stable central mesenteric lymph node at 0.9 cm in diameter. 3. Stable trace free pelvic fluid and stable slight thickening along the right paracolic gutter, without well-defined peritoneal nodularity. 4. Other imaging findings of potential clinical significance: Subsegmental atelectasis or scarring in the lung bases. Hepatic cirrhosis. Left renal scarring. Mild splenomegaly. Sigmoid colon diverticulosis. Impingement at L4-5 due to spondylosis and degenerative disc disease broad Schmorl' s nodes at L3-L4. Pelvic floor laxity   08/23/2016 Tumor Marker   Patient's tumor was tested for the following markers: CA125 Results of the tumor marker test revealed 80.2   09/04/2016 Imaging   LV EF: 60% -   65%   09/12/2016 Tumor Marker   Patient's tumor was tested for the following markers: CA125 Results of the tumor marker test revealed 98.5   09/12/2016 - 01/29/2018 Chemotherapy   The patient received Doxil and Avastin. Avastin was discontinued in Dec 2019 due to GI hemorrhage   09/26/2016 Tumor Marker   Patient's tumor was tested for the following markers: CA125 Results of the tumor marker test revealed 68.9  10/10/2016 Tumor Marker   Patient's tumor was tested for the following markers: CA125 Results of the tumor marker test revealed 65.6   11/07/2016 Tumor Marker   Patient's tumor was tested for the following markers: CA125 Results of the tumor marker test revealed 46.4   11/29/2016 Imaging   Stable mild peritoneal thickening, suspicious for peritoneal carcinomatosis. No new or progressive disease identified. No evidence of ascites.  No significant change in 10 mm left external iliac and 8 mm mesenteric lymph nodes.  Stable hepatic cirrhosis, and splenomegaly consistent with portal venous hypertension.  Colonic diverticulosis, without radiographic evidence of diverticulitis.   12/02/2016 Imaging   Normal LV size with EF 55%. Basal  inferior and basal inferoseptal hypokinesis. Normal RV size and systolic function. No significant valvular abnormalities.   12/05/2016 Tumor Marker   Patient's tumor was tested for the following markers: CA125 Results of the tumor marker test revealed 49.1   01/09/2017 Tumor Marker   Patient's tumor was tested for the following markers: CA125 Results of the tumor marker test revealed 47.2   02/06/2017 Tumor Marker   Patient's tumor was tested for the following markers: CA125 Results of the tumor marker test revealed 44.1   02/18/2017 Imaging   Stable mild peritoneal thickening. No new or progressive disease identified within the abdomen or pelvis.  Stable tiny sub-cm left external iliac and mesenteric lymph nodes.  Stable hepatic cirrhosis, and splenomegaly consistent with portal venous hypertension. No evidence of hepatic neoplasm.  Colonic diverticulosis, without radiographic evidence of diverticulitis.   03/06/2017 Tumor Marker   Patient's tumor was tested for the following markers: CA125 Results of the tumor marker test revealed 50.4   03/17/2017 Imaging   - Left ventricle: The cavity size was normal. Wall thickness was normal. Systolic function was normal. The estimated ejection fraction was in the range of 55% to 60%. Wall motion was normal; there were no regional wall motion abnormalities. Features are consistent with a pseudonormal left ventricular filling pattern, with concomitant abnormal relaxation and increased filling pressure (grade 2 diastolic dysfunction). - Mitral valve: There was mild regurgitation. - Left atrium: The atrium was mildly dilated   04/03/2017 Tumor Marker   Patient's tumor was tested for the following markers: CA125 Results of the tumor marker test revealed 47.2   05/14/2017 Imaging   CT scan of abdomen and pelvis Stable mild peritoneal thickening. No new or progressive disease within the abdomen or pelvis.  Stable hepatic cirrhosis. Stable  splenomegaly, consistent with portal venous hypertension. No evidence of hepatic neoplasm.   05/15/2017 Tumor Marker   Patient's tumor was tested for the following markers: CA125 Results of the tumor marker test revealed 56.1   06/12/2017 Tumor Marker   Patient's tumor was tested for the following markers: CA125 Results of the tumor marker test revealed 49.2   07/10/2017 Tumor Marker   Patient's tumor was tested for the following markers: CA125 Results of the tumor marker test revealed 46.3   08/07/2017 Tumor Marker   Patient's tumor was tested for the following markers: CA125 Results of the tumor marker test revealed 52.9   08/20/2017 Imaging   1. Mild omental/peritoneal haziness, unchanged. No evidence of new metastatic disease. 2. Cirrhosis with splenomegaly.   09/04/2017 Tumor Marker   Patient's tumor was tested for the following markers: CA125 Results of the tumor marker test revealed 48.5   11/13/2017 Tumor Marker   Patient's tumor was tested for the following markers: CA125 Results of the tumor marker test revealed 55.8  12/17/2017 Imaging   1. No findings to suggest metastatic disease in the abdomen or pelvis. 2. Severe hepatic cirrhosis with evidence of portal hypertension, as demonstrated by dilated portal vein, splenomegaly and portosystemic collateral pathways, including gastric and esophageal varices.  3. Colonic diverticulosis without evidence of acute diverticulitis at this time. 4. Additional incidental findings, as above.   01/16/2018 Tumor Marker   Patient's tumor was tested for the following markers: CA125 Results of the tumor marker test revealed 63.7   02/10/2018 Procedure   EGD - Recently bleeding grade III and large (> 5 mm) esophageal varices. Completely eradicated. Banded. - Portal hypertensive gastropathy. - Normal examined duodenum. - No specimens collected   02/13/2018 Tumor Marker   Patient's tumor was tested for the following markers: CA125 Results  of the tumor marker test revealed 82.7   02/25/2018 Imaging   Bone density showed mild osteopenia   04/08/2018 Tumor Marker   Patient's tumor was tested for the following markers: CA125 Results of the tumor marker test revealed 82.7   04/08/2018 Imaging   1. No definite omental or peritoneal surface lesions. However, there are 2 slowly enlarging lymph nodes noted. One is in the small bowel mesentery and the other is in the left deep pelvis. Could not exclude recurrent disease. 2. Stable advanced cirrhotic changes involving the liver with portal venous hypertension, portal venous collaterals, esophageal varices and splenomegaly. No worrisome hepatic lesions. 3. Diffuse wall thickening of the colon could suggest diffuse colitis or could be due to low albumin. Recommend correlation with any symptoms such as diarrhea.   05/21/2018 Tumor Marker   Patient's tumor was tested for the following markers: CA125 Results of the tumor marker test revealed 86   09/07/2018 Tumor Marker   Patient's tumor was tested for the following markers:CA-125 Results of the tumor marker test revealed 111   09/29/2018 Tumor Marker   Patient's tumor was tested for the following markers: CA-125 Results of the tumor marker test revealed 121.   09/29/2018 - 11/24/2018 Chemotherapy   The patient had carboplatin and gemzar for chemotherapy treatment.  Chemo was stopped due to allergic reaction to carboplatin   10/27/2018 Tumor Marker   Patient's tumor was tested for the following markers: CA-125 Results of the tumor marker test revealed 99.9   11/24/2018 Tumor Marker   Patient's tumor was tested for the following markers: CA-125 Results of the tumor marker test revealed 81.2   12/14/2018 Tumor Marker   Patient's tumor was tested for the following markers: CA-125 Results of the tumor marker test revealed 81.2   12/14/2018 Imaging   1. Mild improvement in peritoneal disease. Peritoneal nodule within the left posterior  pelvis contiguous with the left side of vaginal cuff is slightly decreased in size in the interval. Within the right iliac fossa there is a small peritoneal nodule which has decreased in size in the interval. Central small bowel mesenteric lymph node is not significantly changed. Slight decrease in size left periaortic lymph node.    2. Morphologic features of the liver compatible with cirrhosis. Splenomegaly is noted which may reflect portal venous hypertension.   12/21/2018 Tumor Marker   Patient's tumor was tested for the following markers: CA-125 Results of the tumor marker test revealed 64.2   12/21/2018 -  Chemotherapy   The patient had taxotere for chemotherapy treatment.      The patient is able to wave her hand.  She recognized my face.  Objective:  Vitals:   12/30/18 1040 12/30/18  1145  BP:    Pulse:  79  Resp:  (!) 24  Temp: (S) (!) 102.8 F (39.3 C)   SpO2:  97%     Intake/Output Summary (Last 24 hours) at 12/30/2018 1157 Last data filed at 12/30/2018 0848 Gross per 24 hour  Intake 2700 ml  Output -  Net 2700 ml    GENERAL:alert Unable to examine the patient due to lack of PPE but she appears to be responsive to nurses   Labs:  Recent Labs    11/24/18 0800 12/21/18 0820 12/30/18 0720  NA 143 142 133*  K 4.4 3.8 3.8  CL 108 105 102  CO2 24 23 21*  GLUCOSE 159* 138* 193*  BUN 24* 23 39*  CREATININE 0.99 1.01* 1.53*  CALCIUM 9.5 9.0 8.2*  GFRNONAA 57* 56* 34*  GFRAA >60 >60 39*  PROT 8.1 7.7 6.8  ALBUMIN 4.0 4.1 3.7  AST 37 26 43*  ALT 37 33 40  ALKPHOS 144* 133* 71  BILITOT 0.8 0.8 2.8*    Studies:  Dg Lumbar Spine Complete  Result Date: 12/30/2018 CLINICAL DATA:  Recent fall with low back pain, initial encounter EXAM: LUMBAR SPINE - COMPLETE 4+ VIEW COMPARISON:  None. FINDINGS: Five lumbar type vertebral bodies are well visualized. Osteophytic changes are noted worst at L2-3 and L4-5. Facet hypertrophic changes are noted. No anterolisthesis is  seen. No soft tissue abnormality is noted. IMPRESSION: Mild degenerative change without acute abnormality. Electronically Signed   By: Inez Catalina M.D.   On: 12/30/2018 08:51   Ct Head Wo Contrast  Result Date: 12/30/2018 CLINICAL DATA:  Pain following fall.  History of ovarian carcinoma EXAM: CT HEAD WITHOUT CONTRAST CT CERVICAL SPINE WITHOUT CONTRAST TECHNIQUE: Multidetector CT imaging of the head and cervical spine was performed following the standard protocol without intravenous contrast. Multiplanar CT image reconstructions of the cervical spine were also generated. COMPARISON:  None. FINDINGS: CT HEAD FINDINGS Brain: The ventricles are normal in size and configuration. There is a small amount of hemorrhage in the anterior to mid falx consistent with a small falcine subdural hematoma. There is no appreciable mass effect in this area. A small amount of subarachnoid hemorrhage is noted in the left paramedian frontal region near the falcine subdural hematoma. There is equivocal small amount of right frontal subarachnoid hemorrhage as well, not causing mass effect. No other foci of hemorrhage identified. No midline shift. Beyond the mild hemorrhage within the falx, there is no other extra-axial fluid collection. No findings indicative of focal infarct. No intracranial edema evident. Vascular: No hyperdense vessel. There are foci of arterial vascular calcification in the carotid siphon regions. Skull: The bony calvarium appears intact. Sinuses/Orbits: Visualized paranasal sinuses are clear. There is mild rightward deviation of the nasal septum. Orbits appear symmetric bilaterally. Other: Mastoid air cells are clear. CT CERVICAL SPINE FINDINGS Alignment: There is no appreciable spondylolisthesis. Skull base and vertebrae: Skull base and craniocervical junction regions appear normal. No fractures evident. Bones are somewhat osteoporotic. No blastic or lytic bone lesions. Soft tissues and spinal canal:  Prevertebral soft tissues and predental space regions are normal. There is no evident cord or canal hematoma. No paraspinous lesions are demonstrable. Disc levels: There is moderate disc space narrowing at C5-6 and C6-7. There are anterior osteophytes at C5, C6, and C7. There is facet hypertrophy at multiple levels, most severe at C3-4 on the right. There is exit foraminal narrowing due to bony hypertrophy at C5-6 and C6-7 bilaterally. No  frank disc extrusion or stenosis. Upper chest: Visualized upper lung regions are clear. Other: None IMPRESSION: Head CT: 1. Small amount of falcine subdural hematoma without appreciable mass effect. Thickness of the hemorrhage within this area measures 3 mm. No other extra-axial fluid. 2. Small amount of subarachnoid hemorrhage in the right frontal and paramedian left frontal lobe regions without mass effect or edema. 3.  Elsewhere brain parenchyma appears unremarkable. 4.  There are foci of arterial vascular calcification. CT cervical spine: 1.  No fracture or spondylolisthesis. 2. Osteoarthritic change at several levels. No disc extrusion or stenosis evident. 3.  No blastic or lytic bone lesions.  Bone somewhat osteoporotic. Critical Value/emergent results were called by telephone at the time of interpretation on 12/30/2018 at 8:27 am to providerDr. Tomi Bamberger, ED physician, who verbally acknowledged these results. Electronically Signed   By: Lowella Grip III M.D.   On: 12/30/2018 08:27   Ct Cervical Spine Wo Contrast  Result Date: 12/30/2018 CLINICAL DATA:  Pain following fall.  History of ovarian carcinoma EXAM: CT HEAD WITHOUT CONTRAST CT CERVICAL SPINE WITHOUT CONTRAST TECHNIQUE: Multidetector CT imaging of the head and cervical spine was performed following the standard protocol without intravenous contrast. Multiplanar CT image reconstructions of the cervical spine were also generated. COMPARISON:  None. FINDINGS: CT HEAD FINDINGS Brain: The ventricles are normal in  size and configuration. There is a small amount of hemorrhage in the anterior to mid falx consistent with a small falcine subdural hematoma. There is no appreciable mass effect in this area. A small amount of subarachnoid hemorrhage is noted in the left paramedian frontal region near the falcine subdural hematoma. There is equivocal small amount of right frontal subarachnoid hemorrhage as well, not causing mass effect. No other foci of hemorrhage identified. No midline shift. Beyond the mild hemorrhage within the falx, there is no other extra-axial fluid collection. No findings indicative of focal infarct. No intracranial edema evident. Vascular: No hyperdense vessel. There are foci of arterial vascular calcification in the carotid siphon regions. Skull: The bony calvarium appears intact. Sinuses/Orbits: Visualized paranasal sinuses are clear. There is mild rightward deviation of the nasal septum. Orbits appear symmetric bilaterally. Other: Mastoid air cells are clear. CT CERVICAL SPINE FINDINGS Alignment: There is no appreciable spondylolisthesis. Skull base and vertebrae: Skull base and craniocervical junction regions appear normal. No fractures evident. Bones are somewhat osteoporotic. No blastic or lytic bone lesions. Soft tissues and spinal canal: Prevertebral soft tissues and predental space regions are normal. There is no evident cord or canal hematoma. No paraspinous lesions are demonstrable. Disc levels: There is moderate disc space narrowing at C5-6 and C6-7. There are anterior osteophytes at C5, C6, and C7. There is facet hypertrophy at multiple levels, most severe at C3-4 on the right. There is exit foraminal narrowing due to bony hypertrophy at C5-6 and C6-7 bilaterally. No frank disc extrusion or stenosis. Upper chest: Visualized upper lung regions are clear. Other: None IMPRESSION: Head CT: 1. Small amount of falcine subdural hematoma without appreciable mass effect. Thickness of the hemorrhage within  this area measures 3 mm. No other extra-axial fluid. 2. Small amount of subarachnoid hemorrhage in the right frontal and paramedian left frontal lobe regions without mass effect or edema. 3.  Elsewhere brain parenchyma appears unremarkable. 4.  There are foci of arterial vascular calcification. CT cervical spine: 1.  No fracture or spondylolisthesis. 2. Osteoarthritic change at several levels. No disc extrusion or stenosis evident. 3.  No blastic or lytic bone lesions.  Bone somewhat osteoporotic. Critical Value/emergent results were called by telephone at the time of interpretation on 12/30/2018 at 8:27 am to providerDr. Tomi Bamberger, ED physician, who verbally acknowledged these results. Electronically Signed   By: Lowella Grip III M.D.   On: 12/30/2018 08:27   Ct Abdomen Pelvis W Contrast  Addendum Date: 12/15/2018   ADDENDUM REPORT: 12/15/2018 12:23 ADDENDUM: The original report used examination from 04/08/2018 as comparison. The following is a comparison to the more recent previous exam from 09/15/2018. CLINICAL DATA:   Peritoneal carcinoma.  Restaging. EXAM: CT ABDOMEN AND PELVIS WITH CONTRAST TECHNIQUE: Multidetector CT imaging of the abdomen and pelvis was performed using the standard protocol following bolus administration of intravenous contrast. CONTRAST:   160m OMNIPAQUE IOHEXOL 300 MG/ML  SOLN COMPARISON: 09/15/2018 FINDINGS: Lower chest: Bibasilar linear scarring identified. Hepatobiliary: There is a diffusely nodular contour to the liver compatible with cirrhosis. No focal liver abnormality identified. The gallbladder is unremarkable. Pancreas: Unremarkable. No pancreatic ductal dilatation or surrounding inflammatory changes. Spleen: The spleen is enlarged measuring 13.6 cm in length. Adrenals/Urinary Tract: Normal appearance of the adrenal glands. No kidney mass or hydronephrosis identified. The urinary bladder appears normal. Stomach/Bowel: The stomach is unremarkable. No small bowel wall  thickening, inflammation or distension. Persistent wall thickening involving the cecum which is contiguous with adjacent peritoneal thickening and nodularity. Cannot rule out serosal involvement by tumor. No pathologic dilatation of the colon noted. Vascular/Lymphatic: Aortic atherosclerosis. No aneurysm. Left periaortic lymph node measures 1 cm, image 22/2. Previously 1.1 cm. Central small bowel mesenteric node measures 1 cm, image 54/2. Previously 1.0 cm. No enlarged pelvic or inguinal lymph nodes. Reproductive: Status post hysterectomy. Soft tissue nodule within the left pelvis contiguous with the left side of vaginal cuff measures 2.3 x 2.1 cm, image 75/2. Previously 2.7 x 2.3 cm. Other: There is mild bilateral peritoneal thickening. Similar to previous exam. Soft tissue nodule within the right posterior iliac fossa measures 0.8 cm, image 50/2. Previously this measured 0.9 cm. Musculoskeletal: No acute or significant osseous findings. Stable superior endplate deformity involving the L3 vertebra. IMPRESSION: 1. Mild improvement in peritoneal disease. Peritoneal nodule within the left posterior pelvis contiguous with the left side of vaginal cuff is slightly decreased in size in the interval. Within the right iliac fossa there is a small peritoneal nodule which has decreased in size in the interval. Central small bowel mesenteric lymph node is not significantly changed. Slight decrease in size left periaortic lymph node. 2. Morphologic features of the liver compatible with cirrhosis. Splenomegaly is noted which may reflect portal venous hypertension. Electronically Signed   By: TKerby MoorsM.D.   On: 12/15/2018 12:23   Result Date: 12/15/2018 CLINICAL DATA:  Peritoneal carcinoma.  Restaging. EXAM: CT ABDOMEN AND PELVIS WITH CONTRAST TECHNIQUE: Multidetector CT imaging of the abdomen and pelvis was performed using the standard protocol following bolus administration of intravenous contrast. CONTRAST:  1078m OMNIPAQUE IOHEXOL 300 MG/ML  SOLN COMPARISON:  04/08/2018 FINDINGS: Lower chest: Bibasilar linear scarring identified. Hepatobiliary: There is a diffusely nodular contour to the liver compatible with cirrhosis. No focal liver abnormality identified. The gallbladder is unremarkable. Pancreas: Unremarkable. No pancreatic ductal dilatation or surrounding inflammatory changes. Spleen: The spleen is enlarged measuring 13.6 cm in length. Adrenals/Urinary Tract: Normal appearance of the adrenal glands. No kidney mass or hydronephrosis identified. The urinary bladder appears normal. Stomach/Bowel: The stomach is unremarkable. No small bowel wall thickening, inflammation or distension. Persistent wall thickening involving the cecum which is contiguous with  adjacent peritoneal thickening and nodularity. Cannot rule out serosal involvement by tumor. No pathologic dilatation of the colon noted. Vascular/Lymphatic: Aortic atherosclerosis. No aneurysm. Left periaortic lymph node measures 1 cm, image 22/2. Unchanged. Central small bowel mesenteric node measures 1 cm, image 54/2. Previously 0.8 cm. No enlarged pelvic or inguinal lymph nodes. Reproductive: Status post hysterectomy. Soft tissue nodule within the left pelvis contiguous with the left side of vaginal cuff measures 2.3 x 2.1 cm, image 75/2. Previously 1.9 x 1.6 cm. Other: There is mild bilateral peritoneal thickening. Similar to previous exam. Soft tissue nodule within the right posterior iliac fossa measures 0.8 cm, image 50/2. Previously this measured 0.4 cm. Musculoskeletal: No acute or significant osseous findings. Stable superior endplate deformity involving the L3 vertebra. IMPRESSION: 1. Mild progression of disease. Peritoneal nodule within the left posterior pelvis contiguous with the left side of vaginal cuff is slightly increased in size in the interval. Within the right iliac fossa there is a small peritoneal nodule which has increased in size in the interval.  Central small bowel mesenteric lymph node is mildly increased in size. Currently 1 cm. Previously 0.8 cm. Persistent mural thickening involving the cecum which appears contiguous with peritoneal thickening is concerning for serosal involvement by tumor. 2. Morphologic features of the liver compatible with cirrhosis. Splenomegaly is noted which may reflect portal venous hypertension. Electronically Signed: By: Kerby Moors M.D. On: 12/14/2018 14:24   Dg Chest Port 1 View  Result Date: 12/30/2018 CLINICAL DATA:  Fever EXAM: PORTABLE CHEST 1 VIEW COMPARISON:  2015 FINDINGS: Right chest wall port catheter tip overlies the central SVC. Lungs are clear. No pleural effusion. Cardiomediastinal contours are within normal limits with normal heart size. IMPRESSION: No active disease. Electronically Signed   By: Macy Mis M.D.   On: 12/30/2018 07:27

## 2018-12-30 NOTE — ED Provider Notes (Addendum)
Vitals:   12/30/18 0807 12/30/18 0830  BP:  105/71  Pulse: 79 89  Resp:  (!) 22  Temp:    SpO2: 96% 97%   Clinical Course as of Dec 29 952  Wed Dec 30, 2018  0805 Labs reviewed.   Patient has a degree of pancytopenia with decreased platelets mild anemia and leukopenia   [JK]  0807 Pt complains of low back pain.  WIll add on lumbar spine.  BP now improving.  Up to 009F systolic    [JK]  8182 CT scan findings reviewed.  I will consult neurosurgery.   [JK]  9937 Discussed case with Dr Saintclair Halsted, will write a consult note on patient   [JK]  0954 Platelets ordered considering thrombocytopenia and her cerebral hemorrhage   [JK]    Clinical Course User Index [JK] Dorie Rank, MD   Patient initially seen by Dr. Roxanne Mins.  Please see his notes.  Patient presented after a fall.  She is febrile and initially was hypotensive.  Sepsis protocol initiated.  Patient has responded to IV fluid treatment.  Most recent blood pressure has improved.  Patient's mental status is stable.  She is GCS 15.  I will consult with neurosurgery and the medical service for admission.     Dorie Rank, MD 12/30/18 [phone]

## 2018-12-30 NOTE — Progress Notes (Signed)
A consult was received from an ED physician for Aztreonam and Vancomycin per pharmacy dosing.  The patient's profile has been reviewed for ht/wt/allergies/indication/available labs. Aztreonam changed to Cefepime, patient tolerates Cephalosporins Vancomycin 1gm changed to 1553m, last weight 11/3 86 kg    A one time order has been placed for Cefepime 2gm and Vancomycin 15049m  Further antibiotics/pharmacy consults should be ordered by admitting physician if indicated.                       Thank you,  GrMinda DittoharmD 12/30/2018  7:06 AM

## 2018-12-30 NOTE — Progress Notes (Signed)
  Echocardiogram 2D Echocardiogram has been performed.  Jennette Dubin 12/30/2018, 3:24 PM

## 2018-12-30 NOTE — Progress Notes (Addendum)
eLink Physician-Brief Progress Note Patient Name: Olivia Benson DOB: Apr 23, 1945 MRN: [id number]   Date of Service  12/30/2018  HPI/Events of Note  Hypotension despite large amounts of crystalloid, lactate 1.8. Dr. Silas [place] called to ask advice on next steps. Pt is asymptomatic and non-toxic per Dr. Silas [place], and he is not formally consulting PCCM at this point.  eICU Interventions  I recommended re-checking lactate, giving 25 gm of Albumin, and trial of Phenylephrine infusion as a first step. If lactate is elevated or hypotension persists he will consult PCCM.        [name] 12/30/2018, 10:57 PM

## 2018-12-30 NOTE — Telephone Encounter (Signed)
Husband called and left a message. Olivia Benson is in the ER.

## 2018-12-30 NOTE — Progress Notes (Signed)
OT Cancellation Note  Patient Details Name: Olivia GOLEMBESKI MRN: [id number] DOB: September 29, 1945   Cancelled Treatment:    Reason Eval/Treat Not Completed: Other (comment).  Pt to have another CT scan for SDH and BP is low. Will check back tomorrow  Dolores Mcgovern 12/30/2018, 3:24 PM  Lesle Chris, OTR/L Acute Rehabilitation Services [phone] [facility] pager [phone] office 12/30/2018

## 2018-12-30 NOTE — ED Notes (Signed)
Lactic of 2.2 reported to this RN by Lab. Primary RN made aware and MD messaged

## 2018-12-30 NOTE — ED Provider Notes (Signed)
[facility] DEPT Provider Note   CSN: [id number] Arrival date & time: 12/30/18  0620    History   Chief Complaint Chief Complaint  Patient presents with  . Loss of Consciousness  . Fall    HPI Olivia Benson is a 73 y.o. female.   The history is provided by the patient and the EMS personnel.  Loss of Consciousness Fall  She has history of ovarian cancer with peritoneal carcinomatosis currently on chemotherapy and had a fall at home.  She does not know how she fell.  EMS relates that husband stated loss of consciousness for several minutes as well as loss of control of bowel and bladder.  She has also been running a fever at home to 100.8.  She denies chills or sweats.  She denies rhinorrhea, cough, sore throat.  Denies nausea, vomiting, diarrhea.  She denies any urinary difficulty.  She denies exposure to COVID-19.  She is up-to-date on tetanus immunizations.  Past Medical History:  Diagnosis Date  . Asthma   . Cirrhosis ([place])   . Complication of anesthesia    slow to wake up / n & v  . Diabetes mellitus   . Elevated LFTs   . Family history of adverse reaction to anesthesia    multiple family members have had problems such as slow to wake / "flat lined" /  ventilator    . Frequency of urination   . Glaucoma, narrow-angle   . History of skin cancer   . Hypertension   . Non-ST elevated myocardial infarction (non-STEMI) [facility]) Sept 2012   Mild elevation in troponin and CK MB but no evidence of coronary disease at time of cath; per patient " i had undiagnosed heart event but not a heart attack"   . Obesity   . Ovarian cancer ([place]) 11/22/2013   Disseminated ovarian cancer; 09-25-2018   "my cancer is back and im restarting chemo on tuesday 09-25-2018]  . Overactive bladder   . PONV (postoperative nausea and vomiting)    no problems with feb  egd   . Portal hypertension ([place])   . Restless leg syndrome   . Tingling    hands and feet    Patient  Active Problem List   Diagnosis Date Noted  . Chronic diastolic CHF (congestive heart failure) ([place]) 03/09/2018  . Fall 03/08/2018  . Displaced fracture of distal end of right fibula 03/08/2018  . GIB (gastrointestinal bleeding) 02/09/2018  . Obesity due to excess calories 03/20/2017  . Cardiomyopathy ([place]) 01/24/2017  . Sinus congestion 01/24/2017  . 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 Goals of care, counseling/discussion 09/03/2016  . Thrombocytopenia ([place]) 04/15/2016  . Vasomotor instability 01/17/2016  . Osteopenia determined by x-ray 12/22/2015  . Encounter for antineoplastic chemotherapy 12/09/2015  . Elevated tumor markers 10/01/2015  . Obesity (BMI 35.0-39.9 without comorbidity) 05/31/2015  . Port catheter in place 03/29/2015  . Iron deficiency anemia 03/05/2015  . Low back pain 03/05/2015  . Neuropathic pain of foot 11/21/2014  . Diabetes mellitus without complication ([place]) 84/13/2440  . Portacath in place 05/10/2014  . Leukopenia due to antineoplastic chemotherapy ([place]) 05/10/2014  . Splenomegaly 05/10/2014  . Primary peritoneal carcinomatosis ([place]) 05/08/2014  . Genetic testing 03/16/2014  . Neutropenic fever ([place]) 01/27/2014  . Pancytopenia, acquired ([place]) 01/27/2014  . Fever and neutropenia ([place]) 01/27/2014  . Asthma 01/27/2014  . Malignant ascites 01/25/2014  . Chemotherapy induced neutropenia ([place]) 01/25/2014  . Chemotherapy induced thrombocytopenia 01/25/2014  .  RLS (restless legs syndrome) 01/25/2014  . Liver cirrhosis secondary to NASH ([place]) 01/02/2014  . 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 Diabetes mellitus with diabetic neuropathy, with long-term current use of insulin ([place]) 12/03/2013  . Ascites 11/15/2013  . Elevated cancer antigen 125 (CA-125) 11/15/2013  . Cough variant asthma vs UACS 09/02/2013  . Essential hypertension 09/02/2013  . Dyspnea 10/22/2012  . Fatty liver disease, nonalcoholic 26/83/4196  . S/P cardiac  cath 11/06/2010    Past Surgical History:  Procedure Laterality Date  . ABDOMINAL ULTRASOUND  Sept 2012   No gallbladder disease, + fatty liver  . CARDIAC CATHETERIZATION  10/25/2010   EF 60%; Normal coronaries  . COLONOSCOPY  10/18/2005  . COLONOSCOPY WITH PROPOFOL N/A 09/28/2018   Procedure: COLONOSCOPY WITH PROPOFOL;  Surgeon: Ronnette Juniper, MD;  Location: [facility];  Service: Gastroenterology;  Laterality: N/A;  . COSMETIC SURGERY  2003  . DENTAL SURGERY    . ESOPHAGEAL BANDING  02/10/2018   Procedure: ESOPHAGEAL BANDING;  Surgeon: Ronnette Juniper, MD;  Location: [facility];  Service: Gastroenterology;;  . ESOPHAGEAL BANDING N/A 03/23/2018   Procedure: ESOPHAGEAL BANDING;  Surgeon: Ronnette Juniper, MD;  Location: [facility];  Service: Gastroenterology;  Laterality: N/A;  . ESOPHAGEAL BANDING N/A 09/28/2018   Procedure: ESOPHAGEAL BANDING;  Surgeon: Ronnette Juniper, MD;  Location: [facility];  Service: Gastroenterology;  Laterality: N/A;  . ESOPHAGOGASTRODUODENOSCOPY N/A 03/23/2018   Procedure: ESOPHAGOGASTRODUODENOSCOPY (EGD);  Surgeon: Ronnette Juniper, MD;  Location: [facility];  Service: Gastroenterology;  Laterality: N/A;  . ESOPHAGOGASTRODUODENOSCOPY (EGD) WITH PROPOFOL N/A 02/10/2018   Procedure: ESOPHAGOGASTRODUODENOSCOPY (EGD) WITH PROPOFOL;  Surgeon: Ronnette Juniper, MD;  [facility]: [facility];  Service: Gastroenterology;  Laterality: N/A;  . ESOPHAGOGASTRODUODENOSCOPY (EGD) WITH PROPOFOL N/A 09/28/2018   Procedure: ESOPHAGOGASTRODUODENOSCOPY (EGD) WITH PROPOFOL;  Surgeon: Ronnette Juniper, MD;  Location: [facility];  Service: Gastroenterology;  Laterality: N/A;  . EYE SURGERY Bilateral 09/19/14 R eye, 08/2014 L eye   open angles in both eyes d/t narrow angle glaucoma  . LAPAROTOMY N/A 03/29/2014   Procedure: EXPLORATORY LAPAROTOMY;  Surgeon: Everitt Amber, MD;  Location: [facility];  Service: Gynecology;  Laterality: N/A;  . ORIF ANKLE FRACTURE Right 03/09/2018   Procedure: OPEN REDUCTION INTERNAL  FIXATION (ORIF) RIGHT LATERAL MALLEOLUS;  Surgeon: Leandrew Koyanagi, MD;  Location: [facility];  Service: Orthopedics;  Laterality: Right;  . PARTIAL HYSTERECTOMY  1988  . POLYPECTOMY  09/28/2018   Procedure: POLYPECTOMY;  Surgeon: Ronnette Juniper, MD;  Location: [facility];  Service: Gastroenterology;;  . SALPINGOOPHORECTOMY Bilateral 03/29/2014   Procedure: BILATERAL SALPINGO OOPHORECTOMY, OMENTECTOMY, DEBULKING;  Surgeon: Everitt Amber, MD;  Location: [facility];  Service: Gynecology;  Laterality: Bilateral;  . TONSILLECTOMY  1971  . TUBAL LIGATION    . WH-MAMMOGRAPHY  11/24/2009     OB History   No obstetric history on file.      Home Medications    Prior to Admission medications   Medication Sig Start Date End Date Taking? Authorizing Provider  cholecalciferol (VITAMIN D) 1000 units tablet Take 1,000 Units by mouth at bedtime.     [provider]  dexamethasone (DECADRON) 4 MG tablet Start the day before Taxotere, take 1 tablet in the morning, and then 1 tablet daily after chemo for 2 days. Pls dispense 30 tabs 12/15/18   [name], MD  empagliflozin (JARDIANCE) 10 MG TABS tablet Take 10 mg by mouth daily.    [provider]  gabapentin (NEURONTIN) 300 MG capsule TAKE 2 CAPSULES(600 MG) BY  MOUTH TWICE DAILY Patient taking differently: Take 600 mg by mouth 2 (two) times daily.  06/02/18   [name], MD  hydrochlorothiazide (MICROZIDE) 12.5 MG capsule Take 12.5 mg by mouth daily after breakfast.  05/21/17   [provider]  HYDROcodone-acetaminophen (NORCO/VICODIN) 5-325 MG tablet Take 1 tablet by mouth every 6 (six) hours as needed for moderate pain. 12/15/18   [name], MD  insulin degludec (TRESIBA FLEXTOUCH) 100 UNIT/ML SOPN FlexTouch Pen Inject 32 Units into the skin daily after breakfast.    [provider]  lidocaine-prilocaine (EMLA) cream Apply to Porta-cath  1-2 hours prior to access as directed. Patient taking differently: Apply 1 application topically  daily as needed (port access).  11/13/17   [name], MD  nadolol (CORGARD) 20 MG tablet Take 1 tablet (20 mg total) by mouth daily. Patient taking differently: Take 20 mg by mouth at bedtime.  03/23/18 03/23/19  Ronnette Juniper, MD  ondansetron (ZOFRAN) 8 MG tablet Take 1 tablet (8 mg total) by mouth every 8 (eight) hours as needed. Start on the third day after chemotherapy. 09/18/18   [name], MD  pantoprazole (PROTONIX) 40 MG tablet Take 1 tablet (40 mg total) by mouth daily. 07/09/18   [name], MD  prochlorperazine (COMPAZINE) 10 MG tablet Take 1 tablet (10 mg total) by mouth every 6 (six) hours as needed (Nausea or vomiting). 09/18/18   [name], MD  rotigotine (NEUPRO) 4 MG/24HR APPLY 1 PATCH EXTERNALLY TO THE SKIN DAILY Patient taking differently: Place 1 patch onto the skin daily.  05/14/18   Tat, Eustace Quail, DO  valsartan (DIOVAN) 40 MG tablet Take 40 mg by mouth daily.    [provider]    Family History Family History  Problem Relation Age of Onset  . Asthma Mother   . Diabetes Mother   . Hypertension Mother   . Allergies Mother   . Rheum arthritis Mother   . Bladder Cancer Father 39       former heavy smoker  . Hypertension Father   . Stroke Father   . Breast cancer Sister 25  . Diabetes Sister        x 3  . Allergies Sister   . Asthma Sister        x 2   . Breast cancer Maternal Aunt 85  . Breast cancer Cousin 20       (maternal) bilateral cancer  . Aneurysm Son 66  . Diabetes Brother   . Allergies Brother   . Asthma Brother     Social History Social History   Tobacco Use  . Smoking status: Former Smoker    Packs/day: 1.00    Years: 1.00    Pack years: 1.00    Types: Cigarettes    Quit date: 02/11/1982    Years since quitting: 36.9  . Smokeless tobacco: Never Used  Substance Use Topics  . Alcohol use: No    Alcohol/week: 0.0 standard drinks  . Drug use: No     Allergies   Carboplatin, Shellfish allergy, Benadryl [diphenhydramine hcl],  Penicillins, and Tape   Review of Systems Review of Systems  Cardiovascular: Positive for syncope.  All other systems reviewed and are negative.    Physical Exam Updated Vital Signs BP (!) 89/49   Pulse 81   Temp (!) 102.7 F (39.3 C) (Oral)   Resp 19   SpO2 96%   Physical Exam Vitals signs and nursing note reviewed.    73 year  old female, resting comfortably and in no acute distress. Vital signs are significant for elevated temperature and low blood pressure. Oxygen saturation is 96%, which is normal. Head is normocephalic.  There is a scalp laceration in the right parietal area. PERRLA, EOMI. Oropharynx is clear. Neck is nontender without adenopathy or JVD. Back is nontender and there is no CVA tenderness. Lungs are clear without rales, wheezes, or rhonchi. Chest is nontender. Heart has regular rate and rhythm without murmur. Abdomen is soft, flat, nontender without masses or hepatosplenomegaly and peristalsis is normoactive. Extremities have no cyanosis or edema, full range of motion is present. Skin is warm and dry without rash. Neurologic: Awake, alert, oriented x3, cranial nerves are intact, there are no motor or sensory deficits.  ED Treatments / Results  Labs (all labs ordered are listed, but only abnormal results are displayed) Labs Reviewed  CULTURE, BLOOD (ROUTINE X 2)  CULTURE, BLOOD (ROUTINE X 2)  URINE CULTURE  SARS CORONAVIRUS 2 (TAT 6-24 HRS)  LACTIC ACID, PLASMA  LACTIC ACID, PLASMA  COMPREHENSIVE METABOLIC PANEL  CBC WITH DIFFERENTIAL/PLATELET  APTT  PROTIME-INR  URINALYSIS, ROUTINE W REFLEX MICROSCOPIC  CK    EKG EKG Interpretation  Date/Time:  Wednesday December 30 2018 06:30:57 EST Ventricular Rate:  83 PR Interval:    QRS Duration: 93 QT Interval:  338 QTC Calculation: 398 R Axis:   56 Text Interpretation: Sinus rhythm Low voltage, precordial leads Baseline wander in lead(s) V1 When compared with ECG of 11/24/2018, ST depression in  inferior and anterolateral leads has improved Confirmed by Delora Fuel ([id number]) on 12/30/2018 7:01:34 AM   Radiology No results found.  Procedures Procedures  CRITICAL CARE Performed by: Delora Fuel Total critical care time: 45 minutes Critical care time was exclusive of separately billable procedures and treating other patients. Critical care was necessary to treat or prevent imminent or life-threatening deterioration. Critical care was time spent personally by me on the following activities: development of treatment plan with patient and/or surrogate as well as nursing, discussions with consultants, evaluation of patient's response to treatment, examination of patient, obtaining history from patient or surrogate, ordering and performing treatments and interventions, ordering and review of laboratory studies, ordering and review of radiographic studies, pulse oximetry and re-evaluation of patient's condition.  Medications Ordered in ED Medications  sodium chloride 0.9 % bolus 1,000 mL (has no administration in time range)    And  sodium chloride 0.9 % bolus 1,000 mL (has no administration in time range)    And  sodium chloride 0.9 % bolus 1,000 mL (has no administration in time range)  aztreonam (AZACTAM) 2 g in sodium chloride 0.9 % 100 mL IVPB (has no administration in time range)  metroNIDAZOLE (FLAGYL) IVPB 500 mg (has no administration in time range)  vancomycin (VANCOCIN) IVPB 1000 mg/200 mL premix (has no administration in time range)     Initial Impression / Assessment and Plan / ED Course  I have reviewed the triage vital signs and the nursing notes.  Pertinent labs & imaging results that were available during my care of the patient were reviewed by me and considered in my medical decision making (see chart for details).  Fall at home with scalp laceration.  Fever with low blood pressure concerning for sepsis.  Loss of bowel and bladder control is concerning for possible  seizure.  Septic work-up is initiated and she is started on empiric antibiotics.  She is also sent for CT of head and cervical spine because  of her fall.  Old records are reviewed confirming diagnosis of peritoneal carcinomatosis.  She was recently started on docetaxel with infusion given on November 9.  Portable chest x-ray shows no obvious infiltrate per my reading, radiologist interpretation pending.  Labs show mildly elevated alkaline phosphatase which is unchanged from prior values, normal renal function.  Case is signed out to Dr. Tomi Bamberger.  Final Clinical Impressions(s) / ED Diagnoses   Final diagnoses:  Sepsis due to undetermined organism [facility])  Fall at home, initial encounter  Scalp laceration, initial encounter    ED Discharge Orders    None       Delora Fuel, MD 53/69/22 [phone]

## 2018-12-30 NOTE — ED Triage Notes (Signed)
Pt from home found by husband after he heard her fall reported complete loss of consciousness for several minutes . Loss of control bowel and bladder . Currently on chemo therapy for abd CA, pt not tolerating well. Laceration to right temporal region .

## 2018-12-30 NOTE — Sepsis Progress Note (Signed)
Notified bedside nurse of need to draw repeat lactic acid after completion of 3 liter fluid challenge to see if the Lactate is trending down.  Marland Kitchen

## 2018-12-30 NOTE — Progress Notes (Signed)
Patient ID: Olivia Benson, female   DOB: [birth year], 73 y.o.   MRN: [id number] CT scan reviewed. patient with ovarian cancer leukopenia thrombocytopenia had a fall minimal skin subdural with no mass-effect.  No intervention needed.  Repeat CT scan in 6 to 12 hours notify us of any change.  Recommend hold all blood thinners and recommend vitamin K or FFP per medicine to reverse mild coagulopathy.

## 2018-12-30 NOTE — ED Notes (Signed)
Pt with area of redness on distal upper arm, directly above dressing for right AC PIV.  Pt reports area is painful to touch, denies any pain or burning sensation when IV is flushed with NS.  Floor RN Benjamine Mola made aware.  EDP Tomi Bamberger made aware.

## 2018-12-30 NOTE — Progress Notes (Signed)
Pharmacy Antibiotic Note  Olivia Benson is a 73 y.o. female admitted on 12/30/2018 with sepsis.  Pharmacy has been consulted for vancomycin and cefepime dosing. Recently started chemo and presents with neutropenia. She received vancomycin 1500 mg and cefepime 2 g and metronidazole 500 mg one time doses in the ED.   Plan: Start vancomycin 1000 mg IV q48h  Start cefepime 2 g IV q12h   Monitor clinical picture, renal function, vanc levels prn F/U C&S, abx deescalation / LOT    Temp (24hrs), Avg:102.7 F (39.3 C), Min:102.7 F (39.3 C), Max:102.7 F (39.3 C)  Recent Labs  Lab 12/30/18 0720  WBC 1.5*  CREATININE 1.53*  LATICACIDVEN 2.2*    CrCl cannot be calculated (Unknown ideal weight.).    Allergies  Allergen Reactions  . Carboplatin Anaphylaxis  . Shellfish Allergy Anaphylaxis    HER THROAT SWELLS UP AND CLOSES  . Benadryl [Diphenhydramine Hcl] Other (See Comments)    Restless legs   . Penicillins Rash    DID THE REACTION INVOLVE: Swelling of the face/tongue/throat, SOB, or low BP? No Sudden or severe rash/hives, skin peeling, or the inside of the mouth or nose? No Did it require medical treatment? No When did it last happen?Childhood allergy If all above answers are "NO", may proceed with cephalosporin use.   . Tape Itching    Red itch with some tapes    Thank you for allowing pharmacy to be a part of this patient's care.  Darnelle Bos 12/30/2018 10:16 AM

## 2018-12-31 ENCOUNTER — Inpatient Hospital Stay (HOSPITAL_COMMUNITY): Payer: Medicare Other

## 2018-12-31 DIAGNOSIS — L538 Other specified erythematous conditions: Secondary | ICD-10-CM

## 2018-12-31 DIAGNOSIS — M7989 Other specified soft tissue disorders: Secondary | ICD-10-CM | POA: Insufficient documentation

## 2018-12-31 DIAGNOSIS — D689 Coagulation defect, unspecified: Secondary | ICD-10-CM

## 2018-12-31 DIAGNOSIS — D6481 Anemia due to antineoplastic chemotherapy: Secondary | ICD-10-CM

## 2018-12-31 DIAGNOSIS — R197 Diarrhea, unspecified: Secondary | ICD-10-CM | POA: Insufficient documentation

## 2018-12-31 DIAGNOSIS — K1231 Oral mucositis (ulcerative) due to antineoplastic therapy: Secondary | ICD-10-CM

## 2018-12-31 DIAGNOSIS — E861 Hypovolemia: Secondary | ICD-10-CM

## 2018-12-31 DIAGNOSIS — A419 Sepsis, unspecified organism: Secondary | ICD-10-CM

## 2018-12-31 DIAGNOSIS — I959 Hypotension, unspecified: Secondary | ICD-10-CM | POA: Diagnosis present

## 2018-12-31 DIAGNOSIS — R6521 Severe sepsis with septic shock: Secondary | ICD-10-CM

## 2018-12-31 DIAGNOSIS — N179 Acute kidney failure, unspecified: Secondary | ICD-10-CM

## 2018-12-31 DIAGNOSIS — D61810 Antineoplastic chemotherapy induced pancytopenia: Secondary | ICD-10-CM | POA: Diagnosis present

## 2018-12-31 DIAGNOSIS — I609 Nontraumatic subarachnoid hemorrhage, unspecified: Secondary | ICD-10-CM

## 2018-12-31 DIAGNOSIS — E872 Acidosis, unspecified: Secondary | ICD-10-CM | POA: Diagnosis present

## 2018-12-31 DIAGNOSIS — S065XAA Traumatic subdural hemorrhage with loss of consciousness status unknown, initial encounter: Secondary | ICD-10-CM | POA: Diagnosis present

## 2018-12-31 DIAGNOSIS — W19XXXD Unspecified fall, subsequent encounter: Secondary | ICD-10-CM

## 2018-12-31 DIAGNOSIS — S065X9A Traumatic subdural hemorrhage with loss of consciousness of unspecified duration, initial encounter: Secondary | ICD-10-CM | POA: Diagnosis present

## 2018-12-31 DIAGNOSIS — T451X5A Adverse effect of antineoplastic and immunosuppressive drugs, initial encounter: Secondary | ICD-10-CM | POA: Diagnosis present

## 2018-12-31 DIAGNOSIS — I9589 Other hypotension: Secondary | ICD-10-CM

## 2018-12-31 DIAGNOSIS — R238 Other skin changes: Secondary | ICD-10-CM

## 2018-12-31 DIAGNOSIS — R609 Edema, unspecified: Secondary | ICD-10-CM

## 2018-12-31 LAB — COMPREHENSIVE METABOLIC PANEL
ALT: 36 U/L (ref 0–44)
AST: 49 U/L — ABNORMAL HIGH (ref 15–41)
Albumin: 3.1 g/dL — ABNORMAL LOW (ref 3.5–5.0)
Alkaline Phosphatase: 38 U/L (ref 38–126)
Anion gap: 11 (ref 5–15)
BUN: 37 mg/dL — ABNORMAL HIGH (ref 8–23)
CO2: 15 mmol/L — ABNORMAL LOW (ref 22–32)
Calcium: 6.9 mg/dL — ABNORMAL LOW (ref 8.9–10.3)
Chloride: 113 mmol/L — ABNORMAL HIGH (ref 98–111)
Creatinine, Ser: 1.09 mg/dL — ABNORMAL HIGH (ref 0.44–1.00)
GFR calc Af Amer: 59 mL/min — ABNORMAL LOW (ref >60)
GFR calc non Af Amer: 51 mL/min — ABNORMAL LOW (ref >60)
Glucose, Bld: 96 mg/dL (ref 70–99)
Potassium: 3.6 mmol/L (ref 3.5–5.1)
Sodium: 139 mmol/L (ref 135–145)
Total Bilirubin: 1.5 mg/dL — ABNORMAL HIGH (ref 0.3–1.2)
Total Protein: 6.1 g/dL — ABNORMAL LOW (ref 6.5–8.1)

## 2018-12-31 LAB — CBC WITH DIFFERENTIAL/PLATELET
Abs Immature Granulocytes: 0.01 10*3/uL (ref 0.00–0.07)
Basophils Absolute: 0 10*3/uL (ref 0.0–0.1)
Basophils Relative: 1 %
Eosinophils Absolute: 0 10*3/uL (ref 0.0–0.5)
Eosinophils Relative: 0 %
HCT: 30.8 % — ABNORMAL LOW (ref 36.0–46.0)
Hemoglobin: 9.9 g/dL — ABNORMAL LOW (ref 12.0–15.0)
Immature Granulocytes: 1 %
Lymphocytes Relative: 42 %
Lymphs Abs: 0.3 10*3/uL — ABNORMAL LOW (ref 0.7–4.0)
MCH: 31.6 pg (ref 26.0–34.0)
MCHC: 32.1 g/dL (ref 30.0–36.0)
MCV: 98.4 fL (ref 80.0–100.0)
Monocytes Absolute: 0.2 10*3/uL (ref 0.1–1.0)
Monocytes Relative: 23 %
Neutro Abs: 0.2 10*3/uL — ABNORMAL LOW (ref 1.7–7.7)
Neutrophils Relative %: 33 %
Platelets: 50 10*3/uL — ABNORMAL LOW (ref 150–400)
RBC: 3.13 MIL/uL — ABNORMAL LOW (ref 3.87–5.11)
RDW: 16 % — ABNORMAL HIGH (ref 11.5–15.5)
WBC: 0.7 10*3/uL — CL (ref 4.0–10.5)
nRBC: 0 % (ref 0.0–0.2)

## 2018-12-31 LAB — GASTROINTESTINAL PANEL BY PCR, STOOL (REPLACES STOOL CULTURE)

## 2018-12-31 LAB — PREPARE PLATELET PHERESIS: Unit division: 0

## 2018-12-31 LAB — C DIFFICILE QUICK SCREEN W PCR REFLEX
C Diff antigen: NEGATIVE
C Diff interpretation: NOT DETECTED
C Diff toxin: NEGATIVE

## 2018-12-31 LAB — GLUCOSE, CAPILLARY
Glucose-Capillary: 68 mg/dL — ABNORMAL LOW (ref 70–99)
Glucose-Capillary: 75 mg/dL (ref 70–99)
Glucose-Capillary: 99 mg/dL (ref 70–99)
Glucose-Capillary: 102 mg/dL — ABNORMAL HIGH (ref 70–99)
Glucose-Capillary: 95 mg/dL (ref 70–99)

## 2018-12-31 LAB — BPAM PLATELET PHERESIS
Blood Product Expiration Date: 202011192359
ISSUE DATE / TIME: 202011181153
Unit Type and Rh: 7300

## 2018-12-31 LAB — LACTIC ACID, PLASMA
Lactic Acid, Venous: 2 mmol/L (ref 0.5–1.9)
Lactic Acid, Venous: 1.8 mmol/L (ref 0.5–1.9)

## 2018-12-31 LAB — PROTIME-INR
INR: 2.1 — ABNORMAL HIGH (ref 0.8–1.2)
Prothrombin Time: 23.8 seconds — ABNORMAL HIGH (ref 11.4–15.2)

## 2018-12-31 MED ORDER — SODIUM CHLORIDE 0.9% IV SOLUTION
Freq: Once | INTRAVENOUS | Status: AC
  Administered 2018-12-31: 11:00:00 via INTRAVENOUS

## 2018-12-31 MED ORDER — INSULIN GLARGINE 100 UNIT/ML ~~LOC~~ SOLN
20.0000 [IU] | Freq: Every day | SUBCUTANEOUS | Status: DC
  Filled 2018-12-31: qty 0.2

## 2018-12-31 MED ORDER — VANCOMYCIN HCL IN DEXTROSE 750-5 MG/150ML-% IV SOLN
750.0000 mg | INTRAVENOUS | Status: DC
  Administered 2018-12-31 – 2019-01-01 (×2): 750 mg via INTRAVENOUS
  Filled 2018-12-31 (×3): qty 150

## 2018-12-31 MED ORDER — CALCIUM GLUCONATE-NACL 2-0.675 GM/100ML-% IV SOLN
2.0000 g | Freq: Once | INTRAVENOUS | Status: AC
  Administered 2018-12-31: 2000 mg via INTRAVENOUS
  Filled 2018-12-31: qty 100

## 2018-12-31 MED ORDER — LIP MEDEX EX OINT
TOPICAL_OINTMENT | CUTANEOUS | Status: DC | PRN
  Filled 2018-12-31: qty 7

## 2018-12-31 MED ORDER — ACETAMINOPHEN 325 MG PO TABS
650.0000 mg | ORAL_TABLET | Freq: Once | ORAL | Status: AC
  Administered 2018-12-31: 650 mg via ORAL
  Filled 2018-12-31: qty 2

## 2018-12-31 NOTE — Progress Notes (Signed)
CRITICAL VALUE ALERT  Critical Value:  Lactic 2.0  Date & Time Notied:  12/31/2018 1203  Provider Notified: [place]   Orders Received/Actions taken: Awaiting orders. Will continue to monitor the patient

## 2018-12-31 NOTE — Progress Notes (Signed)
CRITICAL VALUE ALERT  Critical Value:  WBC 0.7  Date & Time Notied:  12/31/2018 0613  Provider Notified: Warren Lacy   Orders Received/Actions taken: Awaiting orders. Will continue to monitor the patient

## 2018-12-31 NOTE — Progress Notes (Addendum)
Pharmacy Antibiotic Note  Olivia Benson is a 73 y.o. female admitted on 12/30/2018 with sepsis.  Pharmacy has been consulted for vancomycin and cefepime dosing. Recently started chemo and presents with neutropenia.   Abx for febrile neutropenia of unknown origin. Possible oropharynx source? SCr improved to 1.09. Reports of upper arm rash after load dose of vancomycin, but could be local reaction, MD ok to continue for now  Plan: Change vancomycin to 750m IV q24h Continue cefepime 2 g IV q12h   Monitor clinical picture, renal function, vanc levels prn F/U C&S, abx deescalation / LOT  Height: 5' 1"  (154.9 cm) Weight: 194 lb 0.1 oz (88 kg) IBW/kg (Calculated) : 47.8  Temp (24hrs), Avg:98.8 F (37.1 C), Min:97.4 F (36.3 C), Max:102.8 F (39.3 C)  Recent Labs  Lab 12/30/18 0720 12/30/18 1045 12/30/18 2254 12/31/18 0154 12/31/18 0442  WBC 1.5*  --   --   --  0.7*  CREATININE 1.53*  --   --   --  1.09*  LATICACIDVEN 2.2* 1.8 2.0* 1.8  --     Estimated Creatinine Clearance: 47.8 mL/min (A) (by C-G formula based on SCr of 1.09 mg/dL (H)).    Allergies  Allergen Reactions  . Carboplatin Anaphylaxis  . Shellfish Allergy Anaphylaxis    HER THROAT SWELLS UP AND CLOSES  . Vancomycin Rash    Pt developed a large red rash covering her entire arm after infusion.   . Benadryl [Diphenhydramine Hcl] Other (See Comments)    Restless legs   . Penicillins Rash    DID THE REACTION INVOLVE: Swelling of the face/tongue/throat, SOB, or low BP? No Sudden or severe rash/hives, skin peeling, or the inside of the mouth or nose? No Did it require medical treatment? No When did it last happen?Childhood allergy If all above answers are "NO", may proceed with cephalosporin use.   . Tape Itching    Red itch with some tapes    Thank you for allowing pharmacy to be a part of this patient's care.  BReginia Naas11/19/2020 8:30 AM

## 2018-12-31 NOTE — Progress Notes (Addendum)
PROGRESS NOTE    [name]  YHT:[id number] DOB: Oct 13, 1945 DOA: 12/30/2018 PCP: Gregor Hams, FNP   Brief Narrative:  Olivia Benson is a 73 y.o. 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 She's found to have pancytopenia due to chemotherapy; severe severe sepsis (likely due to chemo induced mucositis) with neutropenic fever (started on broad coverage antibiotics pending cultures), lactic acidosis, hypotension requiring 6L NS bolus and pressor;  subdural hematoma and SAH, for which neurosurgery consulted, received platelet transfusion, FFP and vitamin K, stable SDH/SAH per repeated CTH; mild AKI  Which resolved with IVF. Oncology consulted as well. Pending US venous doppler of RUE (new rash and swelling after iv Vancomycin). Pending GI panel and C diff PCR due to new onset diarrhea.   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[facility])   Diabetes mellitus with diabetic neuropathy, with long-term current use of insulin (HCC)   Liver cirrhosis secondary to NASH (HCC)   Fever and neutropenia (HCC)   Anemia associated with chemotherapy   Thrombocytopenia (HCC)   Mucositis due to antineoplastic therapy   Subdural hematoma (HCC)   SAH (subarachnoid hemorrhage) (HCC)   Antineoplastic chemotherapy induced pancytopenia (HCC)   Sepsis (HCC)   Diarrhea   Redness and swelling of upper arm, right   Coagulopathy (HCC)   Hypocalcemia   Plan: - pt is still critically ill and needs to remain in step down unit for continuous vital signs monitoring - syncope, fall, with SDH and SAH, neurosurgery consulted, pt received platelet transfusion, FFP and vitamin K, platelet  monitored and still >=50, intracranial bleeding stable per repeated CT head. Continue to monitor fever, BP, and BS. Pt has no neurologic deficit. Defer to neurosurgery and oncology if further platelet needed.  - severe sepsis: hypotension now better with pressor and IVF, goal is to wean pressor slowly to keep SBP>=100, albumin infusion and stress dose steroid as needed; fever subsiding; wbc further dropped from 1.5->0.7 (defer to oncology if neupogen needed); lactic acidosis resolved. Pt is alert and orientated X3. Most likely source can be due to mucositis. Pt now also has right upper arm rash (but can be local skin reaction to iv Vancomycin) and diarrhea (infecious etiology vs antibiotics side effect). Will order US venous doppler of upper arm to rule out DVT. Will order GI panel and C diff PCR to rule out infectious diarrhea. Blood culture so far negative, still pending result. Continue Cefepime and vancomycin. Hold off BP medication - appreciate oncology input. Pancytopenia due to chemotherapy. Transfusion as needed. Hb 11->9.9, wbc 1.5->0.7; platelet 83->50.  - start iv calcium gluconate for Ca 6.9 - DM-2, A1C 8.3, BS controlled, continue monitor, continue Lantus and SSI - not appropriate for PT/OT yet  DVT prophylaxis: SCD Code Status: full code Family Communication:  Disposition Plan: pending clinical progress   Consultants:   Oncology; neurosurgery, critical care  Procedures:none  Antimicrobials: iv Vancomycin and Cefepime  Subjective: Pt is alert and orientated. Stated after iv vancomycin, her right arm is red, hot, swelling and painful. Stated she had similar reaction before to vancomycin. Also reported to have multiple diarrhea since yesterday (not presented before admission), associated with lower abdominal pain. Also c/o mouth pain and dryness. No  fever. All three line cell count further dropped. Repeated CT showed stable SDH and SAH. No neurologic deficit. BP so far stabilized by  Neo and IVF. Ca low as 6.9 with albumin 3.1  Objective: Vitals:   12/31/18 1515 12/31/18 1537 12/31/18 1550 12/31/18 1556  BP: (!) 131/54   (!) 124/55  Pulse: 91 89 87 90  Resp: (!) 25 (!) 30 (!) 26 19  Temp: 98 F (36.7 C)   98.5 F (36.9 C)  TempSrc: Oral   Oral  SpO2: 97% 97% 98% 98%  Weight:      Height:        Intake/Output Summary (Last 24 hours) at 12/31/2018 1607 Last data filed at 12/31/2018 1537 Gross per 24 hour  Intake 4269.25 ml  Output --  Net 4269.25 ml   Filed Weights   12/30/18 1038 12/31/18 0447  Weight: 82.6 kg 88 kg    Examination:  General exam: Appears calm and comfortable  HEENT: s/p head trauma with staples to scalp; no periorbital racoon eyes; EOMI; PERRLA; dry lips and mouth mucosa; hearing intact; nose normal Respiratory system: Clear to auscultation. Respiratory effort normal. Cardiovascular system: S1 & S2 heard, RRR. No JVD, murmurs, rubs, gallops or clicks. No pedal edema. Gastrointestinal system: mild lower abdomen tenderness but no guarding or rigidity. Abdomen is nondistended, soft. No organomegaly or masses felt. Normal bowel sounds heard. Central nervous system: Alert and oriented. No focal neurological deficits. Extremities: Symmetric 5 x 5 power. Skin: RUE erythematous skin changes, local swelling, tender, warm Psychiatry: Judgement and insight appear normal. Mood & affect appropriate.     Data Reviewed: I have personally reviewed following labs and imaging studies  CBC: Recent Labs  Lab 12/30/18 0720 12/31/18 0442  WBC 1.5* 0.7*  NEUTROABS 0.1* 0.2*  HGB 11.6* 9.9*  HCT 35.9* 30.8*  MCV 96.8 98.4  PLT 83* 50*   Basic Metabolic Panel: Recent Labs  Lab 12/30/18 0720 12/31/18 0442  NA 133* 139  K 3.8 3.6  CL 102 113*  CO2 21* 15*  GLUCOSE 193* 96  BUN 39* 37*  CREATININE 1.53* 1.09*  CALCIUM 8.2* 6.9*   GFR: Estimated Creatinine Clearance: 47.8 mL/min (A) (by C-G formula based on SCr of 1.09 mg/dL (H)). Liver  Function Tests: Recent Labs  Lab 12/30/18 0720 12/31/18 0442  AST 43* 49*  ALT 40 36  ALKPHOS 71 38  BILITOT 2.8* 1.5*  PROT 6.8 6.1*  ALBUMIN 3.7 3.1*   No results for input(s): LIPASE, AMYLASE in the last 168 hours. No results for input(s): AMMONIA in the last 168 hours. Coagulation Profile: Recent Labs  Lab 12/30/18 0720 12/31/18 0442  INR 1.5* 2.1*   Cardiac Enzymes: Recent Labs  Lab 12/30/18 0720  CKTOTAL 159   BNP (last 3 results) No results for input(s): PROBNP in the last 8760 hours. HbA1C: Recent Labs    12/30/18 1159  HGBA1C 8.3*   CBG: Recent Labs  Lab 12/30/18 1620 12/30/18 2144 12/31/18 0819 12/31/18 0930 12/31/18 1208  GLUCAP 93 93 68* 75 99   Lipid Profile: No results for input(s): CHOL, HDL, LDLCALC, TRIG, CHOLHDL, LDLDIRECT in the last 72 hours. Thyroid Function Tests: Recent Labs    12/30/18 1159  TSH 0.689   Anemia Panel: No results for input(s): VITAMINB12, FOLATE, FERRITIN, TIBC, IRON, RETICCTPCT in the last 72 hours. Sepsis Labs: Recent Labs  Lab 12/30/18 0720 12/30/18 1045 12/30/18 2254 12/31/18 0154  LATICACIDVEN 2.2* 1.8 2.0* 1.8    Recent Results (from  the past 240 hour(s))  Blood Culture (routine x 2)     Status: None (Preliminary result)   Collection Time: 12/30/18  7:20 AM   Specimen: BLOOD  Result Value Ref Range Status   Specimen Description   Final    BLOOD PORTA CATH Performed at [facility] [address] [place], [place] [address]    Special Requests   Final    BOTTLES DRAWN AEROBIC AND ANAEROBIC Blood Culture results may not be optimal due to an inadequate volume of blood received in culture bottles Performed at [facility] [address], [place], [place] [address]    Culture   Final    NO GROWTH 1 DAY Performed at [facility], [place] [address], [place], [place] [address]    Report Status PENDING  Incomplete  Blood Culture (routine x 2)     Status:  None (Preliminary result)   Collection Time: 12/30/18  7:20 AM   Specimen: BLOOD  Result Value Ref Range Status   Specimen Description   Final    BLOOD RIGHT ANTECUBITAL Performed at [facility] [address] [place], [place] [address]    Special Requests   Final    BOTTLES DRAWN AEROBIC AND ANAEROBIC Blood Culture adequate volume Performed at [facility] [address], [place], [place] [address]    Culture   Final    NO GROWTH 1 DAY Performed at [facility], [place] [address], [place], [place] [address]    Report Status PENDING  Incomplete  Urine culture     Status: Abnormal (Preliminary result)   Collection Time: 12/30/18  9:17 AM   Specimen: In/Out Cath Urine  Result Value Ref Range Status   Specimen Description   Final    IN/OUT CATH URINE Performed at [facility] [address] [place], [place] [address]    Special Requests   Final    NONE Performed at [facility], [place] [address] [place], [place] [address]    Culture (A)  Final    ENTEROCOCCUS FAECALIS STAPHYLOCOCCUS AUREUS SUSCEPTIBILITIES TO FOLLOW Performed at [facility], [place] [address] [place],  [address]    Report Status PENDING  Incomplete  SARS CORONAVIRUS 2 (TAT 6-24 HRS) Nasopharyngeal Nasopharyngeal Swab     Status: None   Collection Time: 12/30/18  9:21 AM   Specimen: Nasopharyngeal Swab  Result Value Ref Range Status   SARS Coronavirus 2 NEGATIVE NEGATIVE Final    Comment: (NOTE) SARS-CoV-2 target nucleic acids are NOT DETECTED. The SARS-CoV-2 RNA is generally detectable in upper and lower respiratory specimens during the acute phase of infection. Negative results do not preclude SARS-CoV-2 infection, do not rule out co-infections with other pathogens, and should not be used as the sole basis for treatment or other patient management decisions. Negative results must be combined with clinical  observations, patient history, and epidemiological information. The expected result is Negative. Fact Sheet for Patients: SugarRoll.be Fact Sheet for Healthcare Providers: https://www.woods-mathews.com/ This test is not yet approved or cleared by the [place] FDA and  has been authorized for detection and/or diagnosis of SARS-CoV-2 by FDA under an Emergency Use Authorization (EUA). This EUA will remain  in effect (meaning this test can be used) for the duration of the COVID-19 declaration under Section 56 4(b)(1) of the Act, 21 U.S.C. section 360bbb-3(b)(1), unless the authorization is terminated or revoked sooner. Performed at [facility], [place] [address], [place], [place] [address]   MRSA PCR Screening     Status: None   Collection Time: 12/30/18 12:19 PM   Specimen: Nasal Mucosa; Nasopharyngeal  Result Value Ref Range Status   MRSA by PCR NEGATIVE NEGATIVE Final    Comment:        The GeneXpert MRSA Assay (FDA approved for NASAL specimens only), is one component of a comprehensive MRSA colonization surveillance program. It is not intended to diagnose MRSA infection nor to guide or monitor treatment for MRSA infections. Performed at [facility], [place] [address] [place], [place] [address]   C difficile quick scan w PCR reflex     Status: None   Collection Time: 12/31/18  7:52 AM   Specimen: Stool  Result Value Ref Range Status   C Diff antigen NEGATIVE NEGATIVE Final   C Diff toxin NEGATIVE NEGATIVE Final   C Diff interpretation No C. difficile detected.  Final    Comment: Performed at [facility], [place] [address] [place], [place] [address]         Radiology Studies: Dg Lumbar Spine Complete  Result Date: 12/30/2018 CLINICAL DATA:  Recent fall with low back pain, initial encounter EXAM: LUMBAR SPINE - COMPLETE 4+ VIEW COMPARISON:  None. FINDINGS: Five lumbar type vertebral  bodies are well visualized. Osteophytic changes are noted worst at L2-3 and L4-5. Facet hypertrophic changes are noted. No anterolisthesis is seen. No soft tissue abnormality is noted. IMPRESSION: Mild degenerative change without acute abnormality. Electronically Signed   By: Inez Catalina M.D.   On: 12/30/2018 08:51   Ct Head Wo Contrast  Result Date: 12/30/2018 CLINICAL DATA:  Follow-up subdural hemorrhage. EXAM: CT HEAD WITHOUT CONTRAST TECHNIQUE: Contiguous axial images were obtained from the base of the skull through the vertex without intravenous contrast. COMPARISON:  12/30/2018 FINDINGS: Brain: Small volume parafalcine subdural hematoma measures up to 3 mm in thickness, unchanged. Minimal subdural blood along the right tentorium is also unchanged. No convincing subarachnoid hemorrhage is identified within limitations of mild motion artifact. No acute infarct, mass, or midline shift is evident. The ventricles and sulci are normal in size. Vascular: Calcified atherosclerosis at the skull base. Skull: No fracture or focal osseous lesion. Sinuses/Orbits: Mild, partially polypoid right sphenoid sinus mucosal thickening. Clear mastoid air cells. Bilateral cataract extraction. Other: Right-sided scalp skin staples. IMPRESSION: 1. Unchanged small parafalcine and right tentorial subdural hematomas. 2. No evidence of new intracranial abnormality. Electronically Signed   By: Logan Bores M.D.   On: 12/30/2018 18:10   Ct Head Wo Contrast  Result Date: 12/30/2018 CLINICAL DATA:  Pain following fall.  History of ovarian carcinoma EXAM: CT HEAD WITHOUT CONTRAST CT CERVICAL SPINE WITHOUT CONTRAST TECHNIQUE: Multidetector CT imaging of the head and cervical spine was performed following the standard protocol without intravenous contrast. Multiplanar CT image reconstructions of the cervical spine were also generated. COMPARISON:  None. FINDINGS: CT HEAD FINDINGS Brain: The ventricles are normal in size and  configuration. There is a small amount of hemorrhage in the anterior to mid falx consistent with a small falcine subdural hematoma. There is no appreciable mass effect in this area. A small amount of subarachnoid hemorrhage is noted in the left paramedian frontal region near the falcine subdural hematoma. There is equivocal small amount of right frontal subarachnoid hemorrhage as well, not causing mass effect. No other foci of hemorrhage identified. No midline shift. Beyond the mild hemorrhage within the falx, there  is no other extra-axial fluid collection. No findings indicative of focal infarct. No intracranial edema evident. Vascular: No hyperdense vessel. There are foci of arterial vascular calcification in the carotid siphon regions. Skull: The bony calvarium appears intact. Sinuses/Orbits: Visualized paranasal sinuses are clear. There is mild rightward deviation of the nasal septum. Orbits appear symmetric bilaterally. Other: Mastoid air cells are clear. CT CERVICAL SPINE FINDINGS Alignment: There is no appreciable spondylolisthesis. Skull base and vertebrae: Skull base and craniocervical junction regions appear normal. No fractures evident. Bones are somewhat osteoporotic. No blastic or lytic bone lesions. Soft tissues and spinal canal: Prevertebral soft tissues and predental space regions are normal. There is no evident cord or canal hematoma. No paraspinous lesions are demonstrable. Disc levels: There is moderate disc space narrowing at C5-6 and C6-7. There are anterior osteophytes at C5, C6, and C7. There is facet hypertrophy at multiple levels, most severe at C3-4 on the right. There is exit foraminal narrowing due to bony hypertrophy at C5-6 and C6-7 bilaterally. No frank disc extrusion or stenosis. Upper chest: Visualized upper lung regions are clear. Other: None IMPRESSION: Head CT: 1. Small amount of falcine subdural hematoma without appreciable mass effect. Thickness of the hemorrhage within this  area measures 3 mm. No other extra-axial fluid. 2. Small amount of subarachnoid hemorrhage in the right frontal and paramedian left frontal lobe regions without mass effect or edema. 3.  Elsewhere brain parenchyma appears unremarkable. 4.  There are foci of arterial vascular calcification. CT cervical spine: 1.  No fracture or spondylolisthesis. 2. Osteoarthritic change at several levels. No disc extrusion or stenosis evident. 3.  No blastic or lytic bone lesions.  Bone somewhat osteoporotic. Critical Value/emergent results were called by telephone at the time of interpretation on 12/30/2018 at 8:27 am to providerDr. Tomi Bamberger, ED physician, who verbally acknowledged these results. Electronically Signed   By: Lowella Grip III M.D.   On: 12/30/2018 08:27   Ct Cervical Spine Wo Contrast  Result Date: 12/30/2018 CLINICAL DATA:  Pain following fall.  History of ovarian carcinoma EXAM: CT HEAD WITHOUT CONTRAST CT CERVICAL SPINE WITHOUT CONTRAST TECHNIQUE: Multidetector CT imaging of the head and cervical spine was performed following the standard protocol without intravenous contrast. Multiplanar CT image reconstructions of the cervical spine were also generated. COMPARISON:  None. FINDINGS: CT HEAD FINDINGS Brain: The ventricles are normal in size and configuration. There is a small amount of hemorrhage in the anterior to mid falx consistent with a small falcine subdural hematoma. There is no appreciable mass effect in this area. A small amount of subarachnoid hemorrhage is noted in the left paramedian frontal region near the falcine subdural hematoma. There is equivocal small amount of right frontal subarachnoid hemorrhage as well, not causing mass effect. No other foci of hemorrhage identified. No midline shift. Beyond the mild hemorrhage within the falx, there is no other extra-axial fluid collection. No findings indicative of focal infarct. No intracranial edema evident. Vascular: No hyperdense vessel. There  are foci of arterial vascular calcification in the carotid siphon regions. Skull: The bony calvarium appears intact. Sinuses/Orbits: Visualized paranasal sinuses are clear. There is mild rightward deviation of the nasal septum. Orbits appear symmetric bilaterally. Other: Mastoid air cells are clear. CT CERVICAL SPINE FINDINGS Alignment: There is no appreciable spondylolisthesis. Skull base and vertebrae: Skull base and craniocervical junction regions appear normal. No fractures evident. Bones are somewhat osteoporotic. No blastic or lytic bone lesions. Soft tissues and spinal canal: Prevertebral soft tissues and predental space  regions are normal. There is no evident cord or canal hematoma. No paraspinous lesions are demonstrable. Disc levels: There is moderate disc space narrowing at C5-6 and C6-7. There are anterior osteophytes at C5, C6, and C7. There is facet hypertrophy at multiple levels, most severe at C3-4 on the right. There is exit foraminal narrowing due to bony hypertrophy at C5-6 and C6-7 bilaterally. No frank disc extrusion or stenosis. Upper chest: Visualized upper lung regions are clear. Other: None IMPRESSION: Head CT: 1. Small amount of falcine subdural hematoma without appreciable mass effect. Thickness of the hemorrhage within this area measures 3 mm. No other extra-axial fluid. 2. Small amount of subarachnoid hemorrhage in the right frontal and paramedian left frontal lobe regions without mass effect or edema. 3.  Elsewhere brain parenchyma appears unremarkable. 4.  There are foci of arterial vascular calcification. CT cervical spine: 1.  No fracture or spondylolisthesis. 2. Osteoarthritic change at several levels. No disc extrusion or stenosis evident. 3.  No blastic or lytic bone lesions.  Bone somewhat osteoporotic. Critical Value/emergent results were called by telephone at the time of interpretation on 12/30/2018 at 8:27 am to providerDr. Tomi Bamberger, ED physician, who verbally acknowledged these  results. Electronically Signed   By: Lowella Grip III M.D.   On: 12/30/2018 08:27   Dg Chest Port 1 View  Result Date: 12/30/2018 CLINICAL DATA:  Fever EXAM: PORTABLE CHEST 1 VIEW COMPARISON:  2015 FINDINGS: Right chest wall port catheter tip overlies the central SVC. Lungs are clear. No pleural effusion. Cardiomediastinal contours are within normal limits with normal heart size. IMPRESSION: No active disease. Electronically Signed   By: Macy Mis M.D.   On: 12/30/2018 07:27   Vas [place] Upper Extremity Venous Duplex  Result Date: 12/31/2018 UPPER VENOUS STUDY  Indications: Erythema, Edema, and Pain status post IV Vancomycin Limitations: Interstitial edema and pain with touch. Comparison Study: No prior study on file for comparison Performing Technologist: Sharion Dove RVS  Examination Guidelines: A complete evaluation includes B-mode imaging, spectral Doppler, color Doppler, and power Doppler as needed of all accessible portions of each vessel. Bilateral testing is considered an integral part of a complete examination. Limited examinations for reoccurring indications may be performed as noted.  Right Findings: +----------+------------+---------+-----------+----------+---------------+  RIGHT      Compressible Phasicity Spontaneous Properties     Summary      +----------+------------+---------+-----------+----------+---------------+  IJV            Full        Yes        Yes                                 +----------+------------+---------+-----------+----------+---------------+  Subclavian     Full        Yes        Yes                                 +----------+------------+---------+-----------+----------+---------------+  Axillary       Full        Yes        Yes                                 +----------+------------+---------+-----------+----------+---------------+  Brachial       Full  Yes        Yes                                  +----------+------------+---------+-----------+----------+---------------+  Radial                                                   patent by color  +----------+------------+---------+-----------+----------+---------------+  Ulnar                                                    patent by color  +----------+------------+---------+-----------+----------+---------------+  Cephalic       Full                                                       +----------+------------+---------+-----------+----------+---------------+  Basilic        Full                                                       +----------+------------+---------+-----------+----------+---------------+  Left Findings: +----------+------------+---------+-----------+----------+-------+  LEFT       Compressible Phasicity Spontaneous Properties Summary  +----------+------------+---------+-----------+----------+-------+  Subclavian                 Yes        Yes                         +----------+------------+---------+-----------+----------+-------+  Summary:  Right: No evidence of deep vein thrombosis in the upper extremity. No evidence of superficial vein thrombosis in the upper extremity.  Left: No evidence of thrombosis in the subclavian.  *See table(s) above for measurements and observations.    Preliminary         Scheduled Meds:  Chlorhexidine Gluconate Cloth  6 each Topical Daily   gabapentin  600 mg Oral BID   guaiFENesin  600 mg Oral BID   insulin aspart  0-9 Units Subcutaneous TID WC   [START ON 01/01/2019] insulin glargine  20 Units Subcutaneous QPC breakfast   magic mouthwash w/lidocaine  5 mL Oral QID   mouth rinse  15 mL Mouth Rinse BID   pantoprazole  40 mg Oral Daily   rotigotine  1 patch Transdermal Daily   Tbo-filgastrim (GRANIX) SQ  480 mcg Subcutaneous q1800   Continuous Infusions:  sodium chloride 100 mL/hr at 12/31/18 1530   ceFEPime (MAXIPIME) IV Stopped (12/31/18 0843)   phenylephrine  (NEO-SYNEPHRINE) Adult infusion Stopped (12/31/18 1132)   vancomycin Stopped (12/31/18 1508)     LOS: 1 day    Time spent: total 35 min spent on this encounter with >50% time on direct patient care and plan formulation.     Paticia Stack, MD [facility] Pager [phone]  If 7PM-7AM, please contact night-coverage [email or web address] Password [email or web address] 12/31/2018, 4:07 PM

## 2018-12-31 NOTE — Progress Notes (Signed)
PT Cancellation Note  Patient Details Name: Olivia Benson MRN: [id number] DOB: [birth year]   Cancelled Treatment:    Reason Eval/Treat Not Completed: Medical issues which prohibited therapy RN reports attempting to wean pt from Neo drip at this time and requests to check back later.  Will check back as schedule permits.   Jermine Bibbee,KATHrine E 12/31/2018, 11:28 AM Carmelia Bake, PT, DPT Acute Rehabilitation Services Office: [phone] Pager: [phone]

## 2018-12-31 NOTE — Progress Notes (Signed)
Hypoglycemic Event  CBG: 68  Treatment: 4 oz orange juice, half pancake for breakfast  Symptoms: None assessed  Follow-up CBG: Time: 0931 CBG Result:75  Possible Reasons for Event: Decreased appetite/oral intake

## 2018-12-31 NOTE — Progress Notes (Signed)
Olivia Benson   DOB:03-03-1945   JY#:[id number]    ASSESSMENT & PLAN:  Recurrent primary peritoneal carcinomatosis She had received chemotherapy on November 9th, complicated by neutropenic phase. 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  Her baseline platelet count is around 80,000. She does not need transfusion of blood or platelets today I recommend keeping her platelet count 50,000 and higher  Subdural hematoma/intracranial hemorrhage secondary to fall I agree with transfusion support but I want to set an expectation, due to liver cirrhosis and splenomegaly, even with aggressive platelet transfusion, I do not expect her platelet count to be normalized  Acquired coagulopathy Secondary to baseline liver cirrhosis and possibly mild deficiency of coagulation protein from aggressive antimicrobial therapy I have ordered 4 units of fresh frozen plasma I plan to transfuse to keep INR 1.5 or less if possible due to recent traumatic fall  Acute renal failure, resolved Likely due to dehydration  Neutropenic fever Cultures are pending Chest x-ray is negative The most likely source is likely oropharynx given her complaints at home with mucositis pain Continue broad-spectrum IV antibiotics for now  Diarrhea Likely secondary to antibiotics treatment Once her cultures are negative for approximately 48 hours, I recommend reducing broad-spectrum IV antibiotics  Oral mucositis secondary to chemotherapy I have prescribed Magic mouthwash  Code Status Full code  Goals of care Until resolution of neutropenic fever/sepsis  Discharge planning She will likely be in the hospital for 3 to 5 days All questions were answered. The patient knows to call the clinic with any problems,  questions or concerns. I will follow tomorrow  [name], MD 12/31/2018 8:13 AM  Subjective:  She appears to be alert.  She is on contact isolation due to diarrhea.  She has minimum pain at the laceration site but denies new headaches No reported bleeding from nursing staff She has been afebrile in the last 12 hours Pressor support was initiated due to hypotension and tachycardia Cultures are pending She still have persistent mucositis pain in her mouth  Objective:  Vitals:   12/31/18 0515 12/31/18 0530  BP: (!) 112/45 (!) 112/41  Pulse: 83 83  Resp: (!) 21 20  Temp:    SpO2: 91% 95%     Intake/Output Summary (Last 24 hours) at 12/31/2018 0813 Last data filed at 12/31/2018 0400 Gross per 24 hour  Intake 5098.65 ml  Output -  Net 5098.65 ml    GENERAL:alert, no distress and comfortable OROPHARYNX: Noted dry mucous membrane NEURO: alert & oriented x 3 with fluent speech   Labs:  Recent Labs    12/21/18 0820 12/30/18 0720 12/31/18 0442  NA 142 133* 139  K 3.8 3.8 3.6  CL 105 102 113*  CO2 23 21* 15*  GLUCOSE 138* 193* 96  BUN 23 39* 37*  CREATININE 1.01* 1.53* 1.09*  CALCIUM 9.0 8.2* 6.9*  GFRNONAA 56* 34* 51*  GFRAA >60 39* 59*  PROT 7.7 6.8 6.1*  ALBUMIN 4.1 3.7 3.1*  AST 26 43* 49*  ALT 33 40 36  ALKPHOS 133* 71 38  BILITOT 0.8 2.8* 1.5*    Studies:  Dg Lumbar Spine Complete  Result Date: 12/30/2018 CLINICAL DATA:  Recent fall with low back pain, initial encounter EXAM: LUMBAR SPINE - COMPLETE 4+ VIEW COMPARISON:  None. FINDINGS: Five lumbar type  vertebral bodies are well visualized. Osteophytic changes are noted worst at L2-3 and L4-5. Facet hypertrophic changes are noted. No anterolisthesis is seen. No soft tissue abnormality is noted. IMPRESSION: Mild degenerative change without acute abnormality. Electronically Signed   By: Inez Catalina M.D.   On: 12/30/2018 08:51   Ct Head Wo Contrast  Result Date: 12/30/2018 CLINICAL DATA:  Follow-up  subdural hemorrhage. EXAM: CT HEAD WITHOUT CONTRAST TECHNIQUE: Contiguous axial images were obtained from the base of the skull through the vertex without intravenous contrast. COMPARISON:  12/30/2018 FINDINGS: Brain: Small volume parafalcine subdural hematoma measures up to 3 mm in thickness, unchanged. Minimal subdural blood along the right tentorium is also unchanged. No convincing subarachnoid hemorrhage is identified within limitations of mild motion artifact. No acute infarct, mass, or midline shift is evident. The ventricles and sulci are normal in size. Vascular: Calcified atherosclerosis at the skull base. Skull: No fracture or focal osseous lesion. Sinuses/Orbits: Mild, partially polypoid right sphenoid sinus mucosal thickening. Clear mastoid air cells. Bilateral cataract extraction. Other: Right-sided scalp skin staples. IMPRESSION: 1. Unchanged small parafalcine and right tentorial subdural hematomas. 2. No evidence of new intracranial abnormality. Electronically Signed   By: Logan Bores M.D.   On: 12/30/2018 18:10   Ct Head Wo Contrast  Result Date: 12/30/2018 CLINICAL DATA:  Pain following fall.  History of ovarian carcinoma EXAM: CT HEAD WITHOUT CONTRAST CT CERVICAL SPINE WITHOUT CONTRAST TECHNIQUE: Multidetector CT imaging of the head and cervical spine was performed following the standard protocol without intravenous contrast. Multiplanar CT image reconstructions of the cervical spine were also generated. COMPARISON:  None. FINDINGS: CT HEAD FINDINGS Brain: The ventricles are normal in size and configuration. There is a small amount of hemorrhage in the anterior to mid falx consistent with a small falcine subdural hematoma. There is no appreciable mass effect in this area. A small amount of subarachnoid hemorrhage is noted in the left paramedian frontal region near the falcine subdural hematoma. There is equivocal small amount of right frontal subarachnoid hemorrhage as well, not causing mass  effect. No other foci of hemorrhage identified. No midline shift. Beyond the mild hemorrhage within the falx, there is no other extra-axial fluid collection. No findings indicative of focal infarct. No intracranial edema evident. Vascular: No hyperdense vessel. There are foci of arterial vascular calcification in the carotid siphon regions. Skull: The bony calvarium appears intact. Sinuses/Orbits: Visualized paranasal sinuses are clear. There is mild rightward deviation of the nasal septum. Orbits appear symmetric bilaterally. Other: Mastoid air cells are clear. CT CERVICAL SPINE FINDINGS Alignment: There is no appreciable spondylolisthesis. Skull base and vertebrae: Skull base and craniocervical junction regions appear normal. No fractures evident. Bones are somewhat osteoporotic. No blastic or lytic bone lesions. Soft tissues and spinal canal: Prevertebral soft tissues and predental space regions are normal. There is no evident cord or canal hematoma. No paraspinous lesions are demonstrable. Disc levels: There is moderate disc space narrowing at C5-6 and C6-7. There are anterior osteophytes at C5, C6, and C7. There is facet hypertrophy at multiple levels, most severe at C3-4 on the right. There is exit foraminal narrowing due to bony hypertrophy at C5-6 and C6-7 bilaterally. No frank disc extrusion or stenosis. Upper chest: Visualized upper lung regions are clear. Other: None IMPRESSION: Head CT: 1. Small amount of falcine subdural hematoma without appreciable mass effect. Thickness of the hemorrhage within this area measures 3 mm. No other extra-axial fluid. 2. Small amount of subarachnoid hemorrhage in the right frontal  and paramedian left frontal lobe regions without mass effect or edema. 3.  Elsewhere brain parenchyma appears unremarkable. 4.  There are foci of arterial vascular calcification. CT cervical spine: 1.  No fracture or spondylolisthesis. 2. Osteoarthritic change at several levels. No disc extrusion  or stenosis evident. 3.  No blastic or lytic bone lesions.  Bone somewhat osteoporotic. Critical Value/emergent results were called by telephone at the time of interpretation on 12/30/2018 at 8:27 am to providerDr. Tomi Bamberger, ED physician, who verbally acknowledged these results. Electronically Signed   By: Lowella Grip III M.D.   On: 12/30/2018 08:27   Ct Cervical Spine Wo Contrast  Result Date: 12/30/2018 CLINICAL DATA:  Pain following fall.  History of ovarian carcinoma EXAM: CT HEAD WITHOUT CONTRAST CT CERVICAL SPINE WITHOUT CONTRAST TECHNIQUE: Multidetector CT imaging of the head and cervical spine was performed following the standard protocol without intravenous contrast. Multiplanar CT image reconstructions of the cervical spine were also generated. COMPARISON:  None. FINDINGS: CT HEAD FINDINGS Brain: The ventricles are normal in size and configuration. There is a small amount of hemorrhage in the anterior to mid falx consistent with a small falcine subdural hematoma. There is no appreciable mass effect in this area. A small amount of subarachnoid hemorrhage is noted in the left paramedian frontal region near the falcine subdural hematoma. There is equivocal small amount of right frontal subarachnoid hemorrhage as well, not causing mass effect. No other foci of hemorrhage identified. No midline shift. Beyond the mild hemorrhage within the falx, there is no other extra-axial fluid collection. No findings indicative of focal infarct. No intracranial edema evident. Vascular: No hyperdense vessel. There are foci of arterial vascular calcification in the carotid siphon regions. Skull: The bony calvarium appears intact. Sinuses/Orbits: Visualized paranasal sinuses are clear. There is mild rightward deviation of the nasal septum. Orbits appear symmetric bilaterally. Other: Mastoid air cells are clear. CT CERVICAL SPINE FINDINGS Alignment: There is no appreciable spondylolisthesis. Skull base and vertebrae:  Skull base and craniocervical junction regions appear normal. No fractures evident. Bones are somewhat osteoporotic. No blastic or lytic bone lesions. Soft tissues and spinal canal: Prevertebral soft tissues and predental space regions are normal. There is no evident cord or canal hematoma. No paraspinous lesions are demonstrable. Disc levels: There is moderate disc space narrowing at C5-6 and C6-7. There are anterior osteophytes at C5, C6, and C7. There is facet hypertrophy at multiple levels, most severe at C3-4 on the right. There is exit foraminal narrowing due to bony hypertrophy at C5-6 and C6-7 bilaterally. No frank disc extrusion or stenosis. Upper chest: Visualized upper lung regions are clear. Other: None IMPRESSION: Head CT: 1. Small amount of falcine subdural hematoma without appreciable mass effect. Thickness of the hemorrhage within this area measures 3 mm. No other extra-axial fluid. 2. Small amount of subarachnoid hemorrhage in the right frontal and paramedian left frontal lobe regions without mass effect or edema. 3.  Elsewhere brain parenchyma appears unremarkable. 4.  There are foci of arterial vascular calcification. CT cervical spine: 1.  No fracture or spondylolisthesis. 2. Osteoarthritic change at several levels. No disc extrusion or stenosis evident. 3.  No blastic or lytic bone lesions.  Bone somewhat osteoporotic. Critical Value/emergent results were called by telephone at the time of interpretation on 12/30/2018 at 8:27 am to providerDr. Tomi Bamberger, ED physician, who verbally acknowledged these results. Electronically Signed   By: Lowella Grip III M.D.   On: 12/30/2018 08:27   Ct Abdomen Pelvis W Contrast  Addendum Date: 12/15/2018   ADDENDUM REPORT: 12/15/2018 12:23 ADDENDUM: The original report used examination from 04/08/2018 as comparison. The following is a comparison to the more recent previous exam from 09/15/2018. CLINICAL DATA:   Peritoneal carcinoma.  Restaging. EXAM: CT  ABDOMEN AND PELVIS WITH CONTRAST TECHNIQUE: Multidetector CT imaging of the abdomen and pelvis was performed using the standard protocol following bolus administration of intravenous contrast. CONTRAST:   122m OMNIPAQUE IOHEXOL 300 MG/ML  SOLN COMPARISON: 09/15/2018 FINDINGS: Lower chest: Bibasilar linear scarring identified. Hepatobiliary: There is a diffusely nodular contour to the liver compatible with cirrhosis. No focal liver abnormality identified. The gallbladder is unremarkable. Pancreas: Unremarkable. No pancreatic ductal dilatation or surrounding inflammatory changes. Spleen: The spleen is enlarged measuring 13.6 cm in length. Adrenals/Urinary Tract: Normal appearance of the adrenal glands. No kidney mass or hydronephrosis identified. The urinary bladder appears normal. Stomach/Bowel: The stomach is unremarkable. No small bowel wall thickening, inflammation or distension. Persistent wall thickening involving the cecum which is contiguous with adjacent peritoneal thickening and nodularity. Cannot rule out serosal involvement by tumor. No pathologic dilatation of the colon noted. Vascular/Lymphatic: Aortic atherosclerosis. No aneurysm. Left periaortic lymph node measures 1 cm, image 22/2. Previously 1.1 cm. Central small bowel mesenteric node measures 1 cm, image 54/2. Previously 1.0 cm. No enlarged pelvic or inguinal lymph nodes. Reproductive: Status post hysterectomy. Soft tissue nodule within the left pelvis contiguous with the left side of vaginal cuff measures 2.3 x 2.1 cm, image 75/2. Previously 2.7 x 2.3 cm. Other: There is mild bilateral peritoneal thickening. Similar to previous exam. Soft tissue nodule within the right posterior iliac fossa measures 0.8 cm, image 50/2. Previously this measured 0.9 cm. Musculoskeletal: No acute or significant osseous findings. Stable superior endplate deformity involving the L3 vertebra. IMPRESSION: 1. Mild improvement in peritoneal disease. Peritoneal nodule  within the left posterior pelvis contiguous with the left side of vaginal cuff is slightly decreased in size in the interval. Within the right iliac fossa there is a small peritoneal nodule which has decreased in size in the interval. Central small bowel mesenteric lymph node is not significantly changed. Slight decrease in size left periaortic lymph node. 2. Morphologic features of the liver compatible with cirrhosis. Splenomegaly is noted which may reflect portal venous hypertension. Electronically Signed   By: TKerby MoorsM.D.   On: 12/15/2018 12:23   Result Date: 12/15/2018 CLINICAL DATA:  Peritoneal carcinoma.  Restaging. EXAM: CT ABDOMEN AND PELVIS WITH CONTRAST TECHNIQUE: Multidetector CT imaging of the abdomen and pelvis was performed using the standard protocol following bolus administration of intravenous contrast. CONTRAST:  1015mOMNIPAQUE IOHEXOL 300 MG/ML  SOLN COMPARISON:  04/08/2018 FINDINGS: Lower chest: Bibasilar linear scarring identified. Hepatobiliary: There is a diffusely nodular contour to the liver compatible with cirrhosis. No focal liver abnormality identified. The gallbladder is unremarkable. Pancreas: Unremarkable. No pancreatic ductal dilatation or surrounding inflammatory changes. Spleen: The spleen is enlarged measuring 13.6 cm in length. Adrenals/Urinary Tract: Normal appearance of the adrenal glands. No kidney mass or hydronephrosis identified. The urinary bladder appears normal. Stomach/Bowel: The stomach is unremarkable. No small bowel wall thickening, inflammation or distension. Persistent wall thickening involving the cecum which is contiguous with adjacent peritoneal thickening and nodularity. Cannot rule out serosal involvement by tumor. No pathologic dilatation of the colon noted. Vascular/Lymphatic: Aortic atherosclerosis. No aneurysm. Left periaortic lymph node measures 1 cm, image 22/2. Unchanged. Central small bowel mesenteric node measures 1 cm, image 54/2.  Previously 0.8 cm. No enlarged pelvic or inguinal  lymph nodes. Reproductive: Status post hysterectomy. Soft tissue nodule within the left pelvis contiguous with the left side of vaginal cuff measures 2.3 x 2.1 cm, image 75/2. Previously 1.9 x 1.6 cm. Other: There is mild bilateral peritoneal thickening. Similar to previous exam. Soft tissue nodule within the right posterior iliac fossa measures 0.8 cm, image 50/2. Previously this measured 0.4 cm. Musculoskeletal: No acute or significant osseous findings. Stable superior endplate deformity involving the L3 vertebra. IMPRESSION: 1. Mild progression of disease. Peritoneal nodule within the left posterior pelvis contiguous with the left side of vaginal cuff is slightly increased in size in the interval. Within the right iliac fossa there is a small peritoneal nodule which has increased in size in the interval. Central small bowel mesenteric lymph node is mildly increased in size. Currently 1 cm. Previously 0.8 cm. Persistent mural thickening involving the cecum which appears contiguous with peritoneal thickening is concerning for serosal involvement by tumor. 2. Morphologic features of the liver compatible with cirrhosis. Splenomegaly is noted which may reflect portal venous hypertension. Electronically Signed: By: Kerby Moors M.D. On: 12/14/2018 14:24   Dg Chest Port 1 View  Result Date: 12/30/2018 CLINICAL DATA:  Fever EXAM: PORTABLE CHEST 1 VIEW COMPARISON:  2015 FINDINGS: Right chest wall port catheter tip overlies the central SVC. Lungs are clear. No pleural effusion. Cardiomediastinal contours are within normal limits with normal heart size. IMPRESSION: No active disease. Electronically Signed   By: Macy Mis M.D.   On: 12/30/2018 07:27

## 2018-12-31 NOTE — Progress Notes (Signed)
VASCULAR LAB PRELIMINARY  PRELIMINARY  PRELIMINARY  PRELIMINARY  Right upper extremity venous duplex completed.    Preliminary report:  See CV proc for preliminary results.  Arrow Emmerich, RVT 12/31/2018, 9:41 AM

## 2018-12-31 NOTE — TOC Initial Note (Signed)
Transition of Care [facility]) - Initial/Assessment Note    Patient Details  Name: Olivia Benson MRN: [id number] Date of Birth: 08-17-1945  Transition of [facility]) CM/SW Contact:    Nila Nephew, LCSW Phone Number: [phone] 12/31/2018, 9:35 AM  Clinical Narrative:    Completed high readmission risk screening due to score 39%. Pt admitted with sepsis, pancytopenia, from home where she resides with spouse. Is patient of cancer center under going chemotherapy. TOC team will follow for disposition needs.             Activities of Daily Living Home Assistive Devices/Equipment: Eyeglasses, CBG Meter ADL Screening (condition at time of admission) Patient's cognitive ability adequate to safely complete daily activities?: No Is the patient deaf or have difficulty hearing?: No Does the patient have difficulty seeing, even when wearing glasses/contacts?: No Does the patient have difficulty concentrating, remembering, or making decisions?: Yes Patient able to express need for assistance with ADLs?: Yes Does the patient have difficulty dressing or bathing?: Yes Independently performs ADLs?: No Communication: Independent Dressing (OT): Needs assistance Is this a change from baseline?: Change from baseline, expected to last >3 days Grooming: Needs assistance Is this a change from baseline?: Change from baseline, expected to last >3 days Feeding: Needs assistance Is this a change from baseline?: Change from baseline, expected to last >3 days Bathing: Needs assistance Is this a change from baseline?: Change from baseline, expected to last >3 days Toileting: Needs assistance Is this a change from baseline?: Change from baseline, expected to last >3days In/Out Bed: Needs assistance Is this a change from baseline?: Change from baseline, expected to last >3 days Walks in Home: Needs assistance Is this a change from baseline?: Change from baseline, expected to last >3 days Does the patient have  difficulty walking or climbing stairs?: Yes(secondary to weakness) Weakness of Legs: Both Weakness of Arms/Hands: None  Admission diagnosis:  Encounter for antineoplastic chemotherapy [Z51.11] Subdural hematoma ([place]) [S06.5X9A] Pancytopenia, acquired ([place]) [D61.818] Neutropenic fever ([place]) [D70.9, R50.81] Pancytopenia ([place]) [D61.818] Scalp laceration, initial encounter [S01.01XA] Fall at home, initial encounter [W19.XXXA, Y92.009] Sepsis due to undetermined organism ([place]) [A41.9] Leukopenia due to antineoplastic chemotherapy ([place]) [D70.1, T45.1X5A] Patient Active Problem List   Diagnosis Date Noted  . Subdural hematoma ([place]) 12/31/2018  . SAH (subarachnoid hemorrhage) ([place]) 12/31/2018  . Antineoplastic chemotherapy induced pancytopenia ([place]) 12/31/2018  . Sepsis ([place]) 12/31/2018  . Diarrhea 12/31/2018  . Redness and swelling of upper arm, right 12/31/2018  . Coagulopathy ([place])   . Mucositis due to antineoplastic therapy   . Chronic diastolic CHF (congestive heart failure) ([place]) 03/09/2018  . Fall 03/08/2018  . Displaced fracture of distal end of right fibula 03/08/2018  . GIB (gastrointestinal bleeding) 02/09/2018  . Obesity due to excess calories 03/20/2017  . Cardiomyopathy ([place]) 01/24/2017  . Sinus congestion 01/24/2017  . 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 Goals of care, counseling/discussion 09/03/2016  . Thrombocytopenia ([place]) 04/15/2016  . Vasomotor instability 01/17/2016  . Osteopenia determined by x-ray 12/22/2015  . Encounter for antineoplastic chemotherapy 12/09/2015  . Elevated tumor markers 10/01/2015  . Obesity (BMI 35.0-39.9 without comorbidity) 05/31/2015  . Port catheter in place 03/29/2015  . Iron deficiency anemia 03/05/2015  . Low back pain 03/05/2015  . Neuropathic pain of foot 11/21/2014  . Anemia associated with chemotherapy 07/03/2014  . Diabetes mellitus without complication ([place]) 86/57/8469  . Portacath in place 05/10/2014  .  Leukopenia due to antineoplastic chemotherapy ([place]) 05/10/2014  . Splenomegaly 05/10/2014  .  Primary peritoneal carcinomatosis ([place]) 05/08/2014  . Genetic testing 03/16/2014  . Neutropenic fever ([place]) 01/27/2014  . Pancytopenia, acquired ([place]) 01/27/2014  . Fever and neutropenia ([place]) 01/27/2014  . Asthma 01/27/2014  . Malignant ascites 01/25/2014  . Chemotherapy induced neutropenia ([place]) 01/25/2014  . Chemotherapy induced thrombocytopenia 01/25/2014  . RLS (restless legs syndrome) 01/25/2014  . Liver cirrhosis secondary to NASH ([place]) 01/02/2014  . 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 Diabetes mellitus with diabetic neuropathy, with long-term current use of insulin ([place]) 12/03/2013  . Ascites 11/15/2013  . Elevated cancer antigen 125 (CA-125) 11/15/2013  . Cough variant asthma vs UACS 09/02/2013  . Essential hypertension 09/02/2013  . Dyspnea 10/22/2012  . Fatty liver disease, nonalcoholic 62/56/3893  . S/P cardiac cath 11/06/2010   PCP:  Gregor Hams, FNP Pharmacy:   [facility] [phone] [place], [place] [address] [place] [address] Phone: [phone] Fax: [phone]  [facility] [phone] [place], [place] - [place] [address] [address] Phone: [phone] Fax: [phone]      Readmission Risk Interventions Readmission Risk Prevention Plan 12/31/2018  Transportation Screening Complete  Medication Review (RN Care Manager) Referral to Pharmacy  PCP or Specialist appointment within 3-5 days of discharge Not Complete  PCP/Specialist Appt Not Complete comments DC date unknown but established with pcp and cancer center  [place] or [place] Not Complete  HRI or Home Care Consult Pt Refusal Comments pending need  SW Recovery Care/Counseling Consult Not Complete  SW Consult Not Complete  Comments pending need  Palliative Care Screening Not Complete  Comments pending need  [place] Not Complete  SNF Comments pending need  Some recent data might be hidden

## 2019-01-01 LAB — CBC WITH DIFFERENTIAL/PLATELET
Abs Immature Granulocytes: 0.18 10*3/uL — ABNORMAL HIGH (ref 0.00–0.07)
Basophils Absolute: 0.1 10*3/uL (ref 0.0–0.1)
Basophils Relative: 2 %
Eosinophils Absolute: 0 10*3/uL (ref 0.0–0.5)
Eosinophils Relative: 0 %
HCT: 27 % — ABNORMAL LOW (ref 36.0–46.0)
Hemoglobin: 8.5 g/dL — ABNORMAL LOW (ref 12.0–15.0)
Immature Granulocytes: 7 %
Lymphocytes Relative: 15 %
Lymphs Abs: 0.4 10*3/uL — ABNORMAL LOW (ref 0.7–4.0)
MCH: 31.1 pg (ref 26.0–34.0)
MCHC: 31.5 g/dL (ref 30.0–36.0)
MCV: 98.9 fL (ref 80.0–100.0)
Monocytes Absolute: 0.1 10*3/uL (ref 0.1–1.0)
Monocytes Relative: 5 %
Neutro Abs: 2 10*3/uL (ref 1.7–7.7)
Neutrophils Relative %: 71 %
Platelets: 53 10*3/uL — ABNORMAL LOW (ref 150–400)
RBC: 2.73 MIL/uL — ABNORMAL LOW (ref 3.87–5.11)
RDW: 16.4 % — ABNORMAL HIGH (ref 11.5–15.5)
WBC: 2.8 10*3/uL — ABNORMAL LOW (ref 4.0–10.5)
nRBC: 0.7 % — ABNORMAL HIGH (ref 0.0–0.2)

## 2019-01-01 LAB — URINE CULTURE: Culture: 90000 — AB

## 2019-01-01 LAB — PREPARE FRESH FROZEN PLASMA
Unit division: 0
Unit division: 0
Unit division: 0
Unit division: 0

## 2019-01-01 LAB — BASIC METABOLIC PANEL
Anion gap: 11 (ref 5–15)
BUN: 38 mg/dL — ABNORMAL HIGH (ref 8–23)
CO2: 16 mmol/L — ABNORMAL LOW (ref 22–32)
Calcium: 7.6 mg/dL — ABNORMAL LOW (ref 8.9–10.3)
Chloride: 114 mmol/L — ABNORMAL HIGH (ref 98–111)
Creatinine, Ser: 1.08 mg/dL — ABNORMAL HIGH (ref 0.44–1.00)
GFR calc Af Amer: 60 mL/min — ABNORMAL LOW (ref >60)
GFR calc non Af Amer: 52 mL/min — ABNORMAL LOW (ref >60)
Glucose, Bld: 75 mg/dL (ref 70–99)
Potassium: 3.3 mmol/L — ABNORMAL LOW (ref 3.5–5.1)
Sodium: 141 mmol/L (ref 135–145)

## 2019-01-01 LAB — BPAM FFP
Blood Product Expiration Date: 202011201048
Blood Product Expiration Date: 202011201048
Blood Product Expiration Date: 202011201052
Blood Product Expiration Date: 202011201052
ISSUE DATE / TIME: 202011191113
ISSUE DATE / TIME: 202011191306
ISSUE DATE / TIME: 202011191533
ISSUE DATE / TIME: 202011191750
Unit Type and Rh: 5100
Unit Type and Rh: 5100
Unit Type and Rh: 5100
Unit Type and Rh: 9500

## 2019-01-01 LAB — PROTIME-INR
INR: 1.5 — ABNORMAL HIGH (ref 0.8–1.2)
Prothrombin Time: 18.2 seconds — ABNORMAL HIGH (ref 11.4–15.2)

## 2019-01-01 LAB — GLUCOSE, CAPILLARY
Glucose-Capillary: 70 mg/dL (ref 70–99)
Glucose-Capillary: 125 mg/dL — ABNORMAL HIGH (ref 70–99)

## 2019-01-01 MED ORDER — BACID PO TABS: 2.0000 | ORAL_TABLET | Freq: Three times a day (TID) | ORAL | Status: DC

## 2019-01-01 MED ORDER — DEXTROSE IN LACTATED RINGERS 5 % IV SOLN
INTRAVENOUS | Status: DC
  Administered 2019-01-01 – 2019-01-03 (×2): via INTRAVENOUS

## 2019-01-01 MED ORDER — POTASSIUM CHLORIDE CRYS ER 20 MEQ PO TBCR
40.0000 meq | EXTENDED_RELEASE_TABLET | Freq: Every day | ORAL | Status: AC
  Administered 2019-01-01 – 2019-01-03 (×3): 40 meq via ORAL
  Filled 2019-01-01 (×3): qty 2

## 2019-01-01 MED ORDER — LEVOFLOXACIN 500 MG PO TABS
500.0000 mg | ORAL_TABLET | Freq: Every day | ORAL | Status: DC
  Administered 2019-01-01 – 2019-01-03 (×3): 500 mg via ORAL
  Filled 2019-01-01 (×3): qty 1

## 2019-01-01 MED ORDER — NITROFURANTOIN MONOHYD MACRO 100 MG PO CAPS
100.0000 mg | ORAL_CAPSULE | Freq: Two times a day (BID) | ORAL | Status: DC
  Administered 2019-01-01 – 2019-01-04 (×6): 100 mg via ORAL
  Filled 2019-01-01 (×7): qty 1

## 2019-01-01 MED ORDER — RISAQUAD PO CAPS
2.0000 | ORAL_CAPSULE | Freq: Three times a day (TID) | ORAL | Status: DC
  Administered 2019-01-01 – 2019-01-02 (×4): 2 via ORAL
  Filled 2019-01-01 (×4): qty 2

## 2019-01-01 MED ORDER — SODIUM CHLORIDE 0.9% FLUSH
10.0000 mL | Freq: Two times a day (BID) | INTRAVENOUS | Status: DC
  Administered 2019-01-01 – 2019-01-04 (×4): 10 mL

## 2019-01-01 MED ORDER — SODIUM CHLORIDE 0.9% FLUSH: 10.0000 mL | INTRAVENOUS | Status: DC | PRN

## 2019-01-01 NOTE — Progress Notes (Signed)
Olivia Benson   DOB:01-Mar-1945   GN#:[id number]    ASSESSMENT & PLAN:  Recurrent primary peritoneal carcinomatosis She had received chemotherapy on November 9th, complicated by neutropenic phase. 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 Her baseline platelet count is around 80,000. She does not need transfusion of blood or platelets today I recommend keeping her platelet count 50,000 and higher  Subdural hematoma/intracranial hemorrhage secondary to fall I agree with transfusion support but I want to set an expectation, due to liver cirrhosis and splenomegaly, even with aggressive platelet transfusion, I do not expect her platelet count to be normalized  Acquired coagulopathy Secondary to baseline liver cirrhosis and possibly mild deficiency of coagulation protein from aggressive antimicrobial therapy I have ordered 4 units of fresh frozen plasma, given on 12/31/2018 I plan to transfuse to keep INR 1.5 or less if possible due to recent traumatic fall She does not need fresh frozen plasma infusion today  Acute renal failure, resolved Likely due to dehydration  Neutropenic fever, resolved Urinalysis and urine culture show mixed growth Chest x-ray is negative The most likely source is likely oropharynx given her complaints at home with mucositis pain and possibly UTI Continue broad-spectrum IV antibiotics for now Hopefully, her antibiotics can be narrowed over the next few days  Diarrhea, resolved Likely secondary to antibiotics treatment  Oral mucositis secondary to chemotherapy, resolving I have prescribed Magic mouthwash  Code Status Full code  Goals of care Until resolution of neutropenic  fever/sepsis Hopefully, she can be transition to regular floor today  Discharge planning She will likely be in the hospital for 3 to 5 days All questions were answered. The patient knows to call the clinic with any problems, questions or concerns.All questions were answered. The patient knows to call the clinic with any problems, questions or concerns.   [name], MD 01/01/2019 8:52 AM  Subjective:  She felt better today.  She denies pain.  Mucositis is improving Diarrhea is resolving I did not see any documentation of fever She is off pressor support The patient denies any recent signs or symptoms of bleeding such as spontaneous epistaxis, hematuria or hematochezia.   Objective:  Vitals:   01/01/19 0800 01/01/19 0812  BP: 119/62   Pulse: 81   Resp: 17   Temp:  98.1 F (36.7 C)  SpO2: 94%      Intake/Output Summary (Last 24 hours) at 01/01/2019 7846 Last data filed at 01/01/2019 0800 Gross per 24 hour  Intake 3387.71 ml  Output -  Net 3387.71 ml    GENERAL:alert, no distress and comfortable SKIN: skin color, texture, turgor are normal, no rashes or significant lesions EYES: normal, Conjunctiva are pink and non-injected, sclera clear OROPHARYNX: No ulceration noted in her oropharynx or thrush NECK: supple, thyroid normal size, non-tender, without nodularity LYMPH:  no palpable lymphadenopathy in the cervical, axillary or inguinal LUNGS: clear to auscultation and percussion with normal breathing effort HEART: regular rate & rhythm and no murmurs and no lower extremity edema ABDOMEN:abdomen soft, non-tender and normal bowel sounds Musculoskeletal:no cyanosis of digits and no clubbing  NEURO: alert & oriented x 3 with fluent speech, no focal motor/sensory deficits   Labs:  Recent Labs    12/21/18 0820 12/30/18  0720 12/31/18 0442 01/01/19 0453  NA 142 133* 139 141  K 3.8 3.8 3.6 3.3*  CL 105 102 113* 114*  CO2 23 21* 15* 16*  GLUCOSE 138* 193* 96 75  BUN 23  39* 37* 38*  CREATININE 1.01* 1.53* 1.09* 1.08*  CALCIUM 9.0 8.2* 6.9* 7.6*  GFRNONAA 56* 34* 51* 52*  GFRAA >60 39* 59* 60*  PROT 7.7 6.8 6.1*  --   ALBUMIN 4.1 3.7 3.1*  --   AST 26 43* 49*  --   ALT 33 40 36  --   ALKPHOS 133* 71 38  --   BILITOT 0.8 2.8* 1.5*  --     Studies:  Dg Lumbar Spine Complete  Result Date: 12/30/2018 CLINICAL DATA:  Recent fall with low back pain, initial encounter EXAM: LUMBAR SPINE - COMPLETE 4+ VIEW COMPARISON:  None. FINDINGS: Five lumbar type vertebral bodies are well visualized. Osteophytic changes are noted worst at L2-3 and L4-5. Facet hypertrophic changes are noted. No anterolisthesis is seen. No soft tissue abnormality is noted. IMPRESSION: Mild degenerative change without acute abnormality. Electronically Signed   By: Inez Catalina M.D.   On: 12/30/2018 08:51   Ct Head Wo Contrast  Result Date: 12/30/2018 CLINICAL DATA:  Follow-up subdural hemorrhage. EXAM: CT HEAD WITHOUT CONTRAST TECHNIQUE: Contiguous axial images were obtained from the base of the skull through the vertex without intravenous contrast. COMPARISON:  12/30/2018 FINDINGS: Brain: Small volume parafalcine subdural hematoma measures up to 3 mm in thickness, unchanged. Minimal subdural blood along the right tentorium is also unchanged. No convincing subarachnoid hemorrhage is identified within limitations of mild motion artifact. No acute infarct, mass, or midline shift is evident. The ventricles and sulci are normal in size. Vascular: Calcified atherosclerosis at the skull base. Skull: No fracture or focal osseous lesion. Sinuses/Orbits: Mild, partially polypoid right sphenoid sinus mucosal thickening. Clear mastoid air cells. Bilateral cataract extraction. Other: Right-sided scalp skin staples. IMPRESSION: 1. Unchanged small parafalcine and right tentorial subdural hematomas. 2. No evidence of new intracranial abnormality. Electronically Signed   By: Logan Bores M.D.   On: 12/30/2018 18:10    Ct Head Wo Contrast  Result Date: 12/30/2018 CLINICAL DATA:  Pain following fall.  History of ovarian carcinoma EXAM: CT HEAD WITHOUT CONTRAST CT CERVICAL SPINE WITHOUT CONTRAST TECHNIQUE: Multidetector CT imaging of the head and cervical spine was performed following the standard protocol without intravenous contrast. Multiplanar CT image reconstructions of the cervical spine were also generated. COMPARISON:  None. FINDINGS: CT HEAD FINDINGS Brain: The ventricles are normal in size and configuration. There is a small amount of hemorrhage in the anterior to mid falx consistent with a small falcine subdural hematoma. There is no appreciable mass effect in this area. A small amount of subarachnoid hemorrhage is noted in the left paramedian frontal region near the falcine subdural hematoma. There is equivocal small amount of right frontal subarachnoid hemorrhage as well, not causing mass effect. No other foci of hemorrhage identified. No midline shift. Beyond the mild hemorrhage within the falx, there is no other extra-axial fluid collection. No findings indicative of focal infarct. No intracranial edema evident. Vascular: No hyperdense vessel. There are foci of arterial vascular calcification in the carotid siphon regions. Skull: The bony calvarium appears intact. Sinuses/Orbits: Visualized paranasal sinuses are clear. There is mild rightward deviation of the nasal septum. Orbits appear symmetric bilaterally. Other: Mastoid air cells are clear. CT CERVICAL SPINE FINDINGS Alignment: There is no appreciable spondylolisthesis. Skull base and vertebrae:  Skull base and craniocervical junction regions appear normal. No fractures evident. Bones are somewhat osteoporotic. No blastic or lytic bone lesions. Soft tissues and spinal canal: Prevertebral soft tissues and predental space regions are normal. There is no evident cord or canal hematoma. No paraspinous lesions are demonstrable. Disc levels: There is moderate disc  space narrowing at C5-6 and C6-7. There are anterior osteophytes at C5, C6, and C7. There is facet hypertrophy at multiple levels, most severe at C3-4 on the right. There is exit foraminal narrowing due to bony hypertrophy at C5-6 and C6-7 bilaterally. No frank disc extrusion or stenosis. Upper chest: Visualized upper lung regions are clear. Other: None IMPRESSION: Head CT: 1. Small amount of falcine subdural hematoma without appreciable mass effect. Thickness of the hemorrhage within this area measures 3 mm. No other extra-axial fluid. 2. Small amount of subarachnoid hemorrhage in the right frontal and paramedian left frontal lobe regions without mass effect or edema. 3.  Elsewhere brain parenchyma appears unremarkable. 4.  There are foci of arterial vascular calcification. CT cervical spine: 1.  No fracture or spondylolisthesis. 2. Osteoarthritic change at several levels. No disc extrusion or stenosis evident. 3.  No blastic or lytic bone lesions.  Bone somewhat osteoporotic. Critical Value/emergent results were called by telephone at the time of interpretation on 12/30/2018 at 8:27 am to providerDr. Tomi Bamberger, ED physician, who verbally acknowledged these results. Electronically Signed   By: Lowella Grip III M.D.   On: 12/30/2018 08:27   Ct Cervical Spine Wo Contrast  Result Date: 12/30/2018 CLINICAL DATA:  Pain following fall.  History of ovarian carcinoma EXAM: CT HEAD WITHOUT CONTRAST CT CERVICAL SPINE WITHOUT CONTRAST TECHNIQUE: Multidetector CT imaging of the head and cervical spine was performed following the standard protocol without intravenous contrast. Multiplanar CT image reconstructions of the cervical spine were also generated. COMPARISON:  None. FINDINGS: CT HEAD FINDINGS Brain: The ventricles are normal in size and configuration. There is a small amount of hemorrhage in the anterior to mid falx consistent with a small falcine subdural hematoma. There is no appreciable mass effect in this  area. A small amount of subarachnoid hemorrhage is noted in the left paramedian frontal region near the falcine subdural hematoma. There is equivocal small amount of right frontal subarachnoid hemorrhage as well, not causing mass effect. No other foci of hemorrhage identified. No midline shift. Beyond the mild hemorrhage within the falx, there is no other extra-axial fluid collection. No findings indicative of focal infarct. No intracranial edema evident. Vascular: No hyperdense vessel. There are foci of arterial vascular calcification in the carotid siphon regions. Skull: The bony calvarium appears intact. Sinuses/Orbits: Visualized paranasal sinuses are clear. There is mild rightward deviation of the nasal septum. Orbits appear symmetric bilaterally. Other: Mastoid air cells are clear. CT CERVICAL SPINE FINDINGS Alignment: There is no appreciable spondylolisthesis. Skull base and vertebrae: Skull base and craniocervical junction regions appear normal. No fractures evident. Bones are somewhat osteoporotic. No blastic or lytic bone lesions. Soft tissues and spinal canal: Prevertebral soft tissues and predental space regions are normal. There is no evident cord or canal hematoma. No paraspinous lesions are demonstrable. Disc levels: There is moderate disc space narrowing at C5-6 and C6-7. There are anterior osteophytes at C5, C6, and C7. There is facet hypertrophy at multiple levels, most severe at C3-4 on the right. There is exit foraminal narrowing due to bony hypertrophy at C5-6 and C6-7 bilaterally. No frank disc extrusion or stenosis. Upper chest: Visualized upper lung regions  are clear. Other: None IMPRESSION: Head CT: 1. Small amount of falcine subdural hematoma without appreciable mass effect. Thickness of the hemorrhage within this area measures 3 mm. No other extra-axial fluid. 2. Small amount of subarachnoid hemorrhage in the right frontal and paramedian left frontal lobe regions without mass effect or  edema. 3.  Elsewhere brain parenchyma appears unremarkable. 4.  There are foci of arterial vascular calcification. CT cervical spine: 1.  No fracture or spondylolisthesis. 2. Osteoarthritic change at several levels. No disc extrusion or stenosis evident. 3.  No blastic or lytic bone lesions.  Bone somewhat osteoporotic. Critical Value/emergent results were called by telephone at the time of interpretation on 12/30/2018 at 8:27 am to providerDr. Tomi Bamberger, ED physician, who verbally acknowledged these results. Electronically Signed   By: Lowella Grip III M.D.   On: 12/30/2018 08:27   Ct Abdomen Pelvis W Contrast  Addendum Date: 12/15/2018   ADDENDUM REPORT: 12/15/2018 12:23 ADDENDUM: The original report used examination from 04/08/2018 as comparison. The following is a comparison to the more recent previous exam from 09/15/2018. CLINICAL DATA:   Peritoneal carcinoma.  Restaging. EXAM: CT ABDOMEN AND PELVIS WITH CONTRAST TECHNIQUE: Multidetector CT imaging of the abdomen and pelvis was performed using the standard protocol following bolus administration of intravenous contrast. CONTRAST:   168m OMNIPAQUE IOHEXOL 300 MG/ML  SOLN COMPARISON: 09/15/2018 FINDINGS: Lower chest: Bibasilar linear scarring identified. Hepatobiliary: There is a diffusely nodular contour to the liver compatible with cirrhosis. No focal liver abnormality identified. The gallbladder is unremarkable. Pancreas: Unremarkable. No pancreatic ductal dilatation or surrounding inflammatory changes. Spleen: The spleen is enlarged measuring 13.6 cm in length. Adrenals/Urinary Tract: Normal appearance of the adrenal glands. No kidney mass or hydronephrosis identified. The urinary bladder appears normal. Stomach/Bowel: The stomach is unremarkable. No small bowel wall thickening, inflammation or distension. Persistent wall thickening involving the cecum which is contiguous with adjacent peritoneal thickening and nodularity. Cannot rule out serosal  involvement by tumor. No pathologic dilatation of the colon noted. Vascular/Lymphatic: Aortic atherosclerosis. No aneurysm. Left periaortic lymph node measures 1 cm, image 22/2. Previously 1.1 cm. Central small bowel mesenteric node measures 1 cm, image 54/2. Previously 1.0 cm. No enlarged pelvic or inguinal lymph nodes. Reproductive: Status post hysterectomy. Soft tissue nodule within the left pelvis contiguous with the left side of vaginal cuff measures 2.3 x 2.1 cm, image 75/2. Previously 2.7 x 2.3 cm. Other: There is mild bilateral peritoneal thickening. Similar to previous exam. Soft tissue nodule within the right posterior iliac fossa measures 0.8 cm, image 50/2. Previously this measured 0.9 cm. Musculoskeletal: No acute or significant osseous findings. Stable superior endplate deformity involving the L3 vertebra. IMPRESSION: 1. Mild improvement in peritoneal disease. Peritoneal nodule within the left posterior pelvis contiguous with the left side of vaginal cuff is slightly decreased in size in the interval. Within the right iliac fossa there is a small peritoneal nodule which has decreased in size in the interval. Central small bowel mesenteric lymph node is not significantly changed. Slight decrease in size left periaortic lymph node. 2. Morphologic features of the liver compatible with cirrhosis. Splenomegaly is noted which may reflect portal venous hypertension. Electronically Signed   By: TKerby MoorsM.D.   On: 12/15/2018 12:23   Result Date: 12/15/2018 CLINICAL DATA:  Peritoneal carcinoma.  Restaging. EXAM: CT ABDOMEN AND PELVIS WITH CONTRAST TECHNIQUE: Multidetector CT imaging of the abdomen and pelvis was performed using the standard protocol following bolus administration of intravenous contrast. CONTRAST:  1064mOMNIPAQUE  IOHEXOL 300 MG/ML  SOLN COMPARISON:  04/08/2018 FINDINGS: Lower chest: Bibasilar linear scarring identified. Hepatobiliary: There is a diffusely nodular contour to the liver  compatible with cirrhosis. No focal liver abnormality identified. The gallbladder is unremarkable. Pancreas: Unremarkable. No pancreatic ductal dilatation or surrounding inflammatory changes. Spleen: The spleen is enlarged measuring 13.6 cm in length. Adrenals/Urinary Tract: Normal appearance of the adrenal glands. No kidney mass or hydronephrosis identified. The urinary bladder appears normal. Stomach/Bowel: The stomach is unremarkable. No small bowel wall thickening, inflammation or distension. Persistent wall thickening involving the cecum which is contiguous with adjacent peritoneal thickening and nodularity. Cannot rule out serosal involvement by tumor. No pathologic dilatation of the colon noted. Vascular/Lymphatic: Aortic atherosclerosis. No aneurysm. Left periaortic lymph node measures 1 cm, image 22/2. Unchanged. Central small bowel mesenteric node measures 1 cm, image 54/2. Previously 0.8 cm. No enlarged pelvic or inguinal lymph nodes. Reproductive: Status post hysterectomy. Soft tissue nodule within the left pelvis contiguous with the left side of vaginal cuff measures 2.3 x 2.1 cm, image 75/2. Previously 1.9 x 1.6 cm. Other: There is mild bilateral peritoneal thickening. Similar to previous exam. Soft tissue nodule within the right posterior iliac fossa measures 0.8 cm, image 50/2. Previously this measured 0.4 cm. Musculoskeletal: No acute or significant osseous findings. Stable superior endplate deformity involving the L3 vertebra. IMPRESSION: 1. Mild progression of disease. Peritoneal nodule within the left posterior pelvis contiguous with the left side of vaginal cuff is slightly increased in size in the interval. Within the right iliac fossa there is a small peritoneal nodule which has increased in size in the interval. Central small bowel mesenteric lymph node is mildly increased in size. Currently 1 cm. Previously 0.8 cm. Persistent mural thickening involving the cecum which appears contiguous with  peritoneal thickening is concerning for serosal involvement by tumor. 2. Morphologic features of the liver compatible with cirrhosis. Splenomegaly is noted which may reflect portal venous hypertension. Electronically Signed: By: Kerby Moors M.D. On: 12/14/2018 14:24   Dg Chest Port 1 View  Result Date: 12/30/2018 CLINICAL DATA:  Fever EXAM: PORTABLE CHEST 1 VIEW COMPARISON:  2015 FINDINGS: Right chest wall port catheter tip overlies the central SVC. Lungs are clear. No pleural effusion. Cardiomediastinal contours are within normal limits with normal heart size. IMPRESSION: No active disease. Electronically Signed   By: Macy Mis M.D.   On: 12/30/2018 07:27   Vas [place] Upper Extremity Venous Duplex  Result Date: 12/31/2018 UPPER VENOUS STUDY  Indications: Erythema, Edema, and Pain status post IV Vancomycin Limitations: Interstitial edema and pain with touch. Comparison Study: No prior study on file for comparison Performing Technologist: Sharion Dove RVS  Examination Guidelines: A complete evaluation includes B-mode imaging, spectral Doppler, color Doppler, and power Doppler as needed of all accessible portions of each vessel. Bilateral testing is considered an integral part of a complete examination. Limited examinations for reoccurring indications may be performed as noted.  Right Findings: +----------+------------+---------+-----------+----------+---------------+ RIGHT     CompressiblePhasicitySpontaneousProperties    Summary     +----------+------------+---------+-----------+----------+---------------+ IJV           Full       Yes       Yes                              +----------+------------+---------+-----------+----------+---------------+ Subclavian    Full       Yes       Yes                              +----------+------------+---------+-----------+----------+---------------+  Axillary      Full       Yes       Yes                               +----------+------------+---------+-----------+----------+---------------+ Brachial      Full       Yes       Yes                              +----------+------------+---------+-----------+----------+---------------+ Radial                                              patent by color +----------+------------+---------+-----------+----------+---------------+ Ulnar                                               patent by color +----------+------------+---------+-----------+----------+---------------+ Cephalic      Full                                                  +----------+------------+---------+-----------+----------+---------------+ Basilic       Full                                                  +----------+------------+---------+-----------+----------+---------------+  Left Findings: +----------+------------+---------+-----------+----------+-------+ LEFT      CompressiblePhasicitySpontaneousPropertiesSummary +----------+------------+---------+-----------+----------+-------+ Subclavian               Yes       Yes                      +----------+------------+---------+-----------+----------+-------+  Summary:  Right: No evidence of deep vein thrombosis in the upper extremity. No evidence of superficial vein thrombosis in the upper extremity.  Left: No evidence of thrombosis in the subclavian.  *See table(s) above for measurements and observations.  Diagnosing physician: Monica Martinez MD Electronically signed by Monica Martinez MD on 12/31/2018 at 7:11:02 PM.    Final

## 2019-01-01 NOTE — Progress Notes (Signed)
Patient with low grade temp -  100.3.  MD made aware and tylenol given

## 2019-01-01 NOTE — Evaluation (Signed)
Physical Therapy Evaluation Patient Details Name: Olivia Benson MRN: [id number] DOB: [birth year] Today's Date: 01/01/2019   History of Present Illness  "73 y.o. 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 She's found to have pancytopenia due to chemotherapy; severe severe sepsis (likely due to chemo induced mucositis) with neutropenic fever (started on broad coverage antibiotics pending cultures), lactic acidosis, hypotension requiring 6L NS bolus and pressor;  subdural hematoma and SAH, for which neurosurgery consulted, received platelet transfusion, FFP and vitamin K, stable SDH/SAH per repeated CTH"  Clinical Impression  Pt admitted with above diagnosis.  Pt currently with functional limitations due to the deficits listed below (see PT Problem List). Pt will benefit from skilled PT to increase their independence and safety with mobility to allow discharge to the venue listed below.  Pt presents with limited endurance and generalized weakness.  Pt also reports R UE pain limiting WBing with RW (also with pain at mid right buttock and radiating down leg).  Pt determined to d/c home.  Recommend [facility] services and 24/7 assist upon d/c.  Pt declines SNF.     Follow Up Recommendations Home health PT;Supervision/Assistance - 24 hour(would benefit from post acute PT however pt wishes to d/c home)    Equipment Recommendations  Rolling walker with 5" wheels    Recommendations for Other Services       Precautions / Restrictions Precautions Precautions: Fall Precaution Comments: R UE painful/tender Restrictions Weight Bearing Restrictions: No      Mobility  Bed Mobility Overal bed mobility: Needs Assistance Bed Mobility: Supine to Sit;Sit to Supine      Supine to sit: Mod assist Sit to supine: Mod assist;+2 for physical assistance   General bed mobility comments: assist to lift and lower trunk and to lift LEs onto bed  Transfers Overall transfer level: Needs assistance Equipment used: Rolling walker (2 wheeled) Transfers: Sit to/from Stand Sit to Stand: Mod assist;+2 safety/equipment         General transfer comment: assist to power up into standing.  She was able to side step up the side of the road with min A +2 for safety; performed standing twice, pt able to march in place briefly however effortful  Ambulation/Gait             General Gait Details: deferred; pt felt to weak today  Stairs            Wheelchair Mobility    Modified Rankin (Stroke Patients Only)       Balance Overall balance assessment: Needs assistance;History of Falls Sitting-balance support: Feet supported Sitting balance-Leahy Scale: Fair Sitting balance - Comments: able to maintain EOB sitting with min guard assist   Standing balance support: Bilateral upper extremity supported Standing balance-Leahy Scale: Poor Standing balance comment: requires UE support and min A                              Pertinent Vitals/Pain Pain Assessment: 0-10 Pain Score: 10-Worst pain ever Pain Location: Rt UE and Rt hip  Pain Descriptors / Indicators: Aching;Grimacing;Guarding;Radiating;Sharp Pain Intervention(s): Monitored during session;Limited activity within patient's tolerance;Repositioned    Home Living Family/patient expects to be discharged to:: Private residence Living Arrangements: Spouse/significant other Available Help at Discharge: Family;Available 24 hours/day Type  of Home: House Home Access: Stairs to enter Entrance Stairs-Rails: Psychiatric nurse of Steps: 6 Home Layout: One level Home Equipment: Grab bars - tub/shower;Crutches;Walker - 2 wheels;Bedside commode      Prior Function Level of  Independence: Independent         Comments: Pt continues to work part time.      Hand Dominance   Dominant Hand: Right    Extremity/Trunk Assessment   Upper Extremity Assessment Upper Extremity Assessment: RUE deficits/detail RUE Deficits / Details: Pt reports IV infilatrated.  Rt upper arm red and swollen.  She is very guarded with movement reporting pain 12/10    Lower Extremity Assessment Lower Extremity Assessment: Generalized weakness    Cervical / Trunk Assessment Cervical / Trunk Assessment: Normal  Communication   Communication: No difficulties  Cognition Arousal/Alertness: Awake/alert Behavior During Therapy: WFL for tasks assessed/performed Overall Cognitive Status: Within Functional Limits for tasks assessed                                        General Comments General comments (skin integrity, edema, etc.): DOE 3/4 - 4/4 with activity.  02 sats >94% on RA, HR to 114    Exercises     Assessment/Plan    PT Assessment Patient needs continued PT services  PT Problem List Decreased balance;Decreased knowledge of use of DME;Decreased activity tolerance;Decreased strength;Decreased mobility       PT Treatment Interventions Gait training;Therapeutic exercise;DME instruction;Therapeutic activities;Patient/family education;Stair training;Functional mobility training;Balance training    PT Goals (Current goals can be found in the Care Plan section)  Acute Rehab PT Goals Patient Stated Goal: to get stronger and have less pain  PT Goal Formulation: With patient Time For Goal Achievement: 01/15/19 Potential to Achieve Goals: Good    Frequency Min 3X/week   Barriers to discharge        Co-evaluation PT/OT/SLP Co-Evaluation/Treatment: Yes Reason for Co-Treatment: For patient/therapist safety;To address functional/ADL transfers;Complexity of the patient's impairments (multi-system involvement) PT goals addressed during session:  Mobility/safety with mobility OT goals addressed during session: ADL's and self-care       AM-PAC PT "6 Clicks" Mobility  Outcome Measure Help needed turning from your back to your side while in a flat bed without using bedrails?: A Little Help needed moving from lying on your back to sitting on the side of a flat bed without using bedrails?: A Lot Help needed moving to and from a bed to a chair (including a wheelchair)?: A Lot Help needed standing up from a chair using your arms (e.g., wheelchair or bedside chair)?: A Lot Help needed to walk in hospital room?: Total Help needed climbing 3-5 steps with a railing? : Total 6 Click Score: 11    End of Session Equipment Utilized During Treatment: Gait belt Activity Tolerance: Patient limited by fatigue Patient left: in bed;with call bell/phone within reach;with bed alarm set Nurse Communication: Mobility status PT Visit Diagnosis: Other abnormalities of gait and mobility (R26.89);History of falling (Z91.81)    Time: 1335-1401 PT Time Calculation (min) (ACUTE ONLY): 26 min   Charges:   PT Evaluation $PT Eval Low Complexity: [place], PT, DPT Acute Rehabilitation Services Office: [phone] Pager: [phone]  Trena Platt 01/01/2019, 3:15 PM

## 2019-01-01 NOTE — Progress Notes (Signed)
PROGRESS NOTE    MONIGUE SPRAGGINS  GUY:[id number] DOB: 1945/03/04 DOA: 12/30/2018 PCP: Gregor Hams, FNP   Brief Narrative:  Olivia Benson is a 73 y.o. 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 She's found to have pancytopenia due to chemotherapy; severe severe sepsis (likely due to chemo induced mucositis) with neutropenic fever (started on broad coverage antibiotics pending cultures), lactic acidosis, hypotension requiring 6L NS bolus and pressor;  subdural hematoma and SAH, for which neurosurgery consulted, received platelet transfusion, FFP and vitamin K, stable SDH/SAH per repeated CTH; mild AKI  Which resolved with IVF. Oncology consulted as well. Pending US venous doppler of RUE (new rash and swelling after iv Vancomycin). Pending GI panel and C diff PCR due to new onset diarrhea.   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[facility])   Diabetes mellitus with diabetic neuropathy, with long-term current use of insulin (HCC)   Liver cirrhosis secondary to NASH (HCC)   Fever and neutropenia (HCC)   Anemia associated with chemotherapy   Thrombocytopenia (HCC)   Mucositis due to antineoplastic therapy   Subdural hematoma (HCC)   SAH (subarachnoid hemorrhage) (HCC)   Antineoplastic chemotherapy induced pancytopenia (HCC)   Sepsis (HCC)   Diarrhea   Redness and swelling of upper arm, right   Coagulopathy (HCC)   Hypocalcemia   Plan: - severe sepsis /neutropenic fever due to mucositis  -Patient will be transferred out of ICU on 01/01/2019 as she does not seem to be critical anymore.  IV fluids and Levophed has been discontinued.  Patient is able to drink and eat  on her own. Oncology input appreciated. Granix discontinued. Broad-spectrum vancomycin and cefepime will be tapered down to Levaquin for 5 more days as cultures are negative, she is afebrile and she is not neutropenic anymore. Antibiotics switched to Levaquin for neutropenic fever and nitrofurantoin due to VRE and   MRSA growing in urine about 80,000 colonies, most likely colonization/contamination, however given patient's neutropenic status, will treat nevertheless for 5 more days.  Diarrhea seems to have resolved.  GI panel and C. difficile are negative. Will add a probiotic  - syncope, fall, with SDH and SAH, neurosurgery consulted, pt received platelet transfusion, FFP and vitamin K, platelet monitored and still >=50, intracranial bleeding stable per repeated CT head. Continue to monitor fever, BP, and BS. Pt has no neurologic deficit. Defer to neurosurgery and oncology if further platelet needed.     Hold off BP medication - appreciate oncology input. Pancytopenia due to chemotherapy. Transfusion as needed. Hb 11->9.9, wbc 1.5->0.7; platelet 83->50.  - start iv calcium gluconate for Ca 6.9 - DM-2, A1C 8.3, BS controlled, continue monitor, continue Lantus and SSI  We will order PT OT.  Replete potassium for hypokalemia as needed.  #Right arm vancomycin infiltration-arm is red and swollen.  Continue with elevation and ice pack, wound care consulted.  No further intervention needed. Ultrasound is negative for DVT in right upper extremity.   History of ovarian cancer-currently on chemotherapy.-Outpatient follow-up with oncology.     DVT prophylaxis: SCD Code Status: full code Family Communication:  Disposition Plan: pending clinical progress   Consultants:   Oncology; neurosurgery, critical care  Procedures:none  Antimicrobials: Levaquin and nitrofurantoin  Subjective: Pt is alert and orientated. Patient feels much better clinically today.  No more overnight fever.  She  has mild oral stomatitis/dry mouth.  No visible ulcers. Cultures negative so far Potassium is low at 3.3, creatinine 1.28, white cell count increased to 2.8, hemoglobin 8.5, platelets are 53. Marland Kitchen No fever. . Repeated CT showed stable SDH and SAH. No neurologic deficit.  Objective: Vitals:   01/01/19 1116 01/01/19 1346 01/01/19 1351 01/01/19 1438  BP: (!) 147/72 (!) 165/70 (!) 160/64 (!) 143/63  Pulse: 92 (!) 110 (!) 110 (!) 104  Resp: (!) 22   18  Temp: 98.6 F (37 C)   100.3 F (37.9 C)  TempSrc: Oral   Oral  SpO2: 96% 97% 98% 98%  Weight:      Height:        Intake/Output Summary (Last 24 hours) at 01/01/2019 1736 Last data filed at 01/01/2019 1036 Gross per 24 hour  Intake 1771.56 ml  Output 100 ml  Net 1671.56 ml   Filed Weights   12/30/18 1038 12/31/18 0447 01/01/19 0450  Weight: 82.6 kg 88 kg 89 kg    Examination:  General exam: Appears calm and comfortable  HEENT: s/p head trauma with staples to scalp; no periorbital racoon eyes; EOMI; PERRLA; dry lips and mouth mucosa; hearing intact; nose normal Respiratory system: Clear to auscultation. Respiratory effort normal. Cardiovascular system: S1 & S2 heard, RRR. No JVD, murmurs, rubs, gallops or clicks. No pedal edema. Gastrointestinal system: mild lower abdomen tenderness but no guarding or rigidity. Abdomen is nondistended, soft. No organomegaly or masses felt. Normal bowel sounds heard. Central nervous system: Alert and oriented. No focal neurological deficits. Extremities: Symmetric 5 x 5 power. Skin: RUE erythematous skin changes, local swelling, tender, warm Psychiatry: Judgement and insight appear normal. Mood & affect appropriate.      Data Reviewed: I have personally reviewed following labs and imaging studies  CBC: Recent Labs  Lab 12/30/18 0720 12/31/18 0442 01/01/19 0453  WBC 1.5* 0.7* 2.8*  NEUTROABS 0.1* 0.2* 2.0  HGB 11.6* 9.9* 8.5*  HCT 35.9* 30.8* 27.0*  MCV 96.8 98.4 98.9  PLT 83* 50* 53*    Basic Metabolic Panel: Recent Labs  Lab 12/30/18 0720 12/31/18 0442 01/01/19 0453  NA 133* 139 141  K 3.8 3.6 3.3*  CL 102 113* 114*  CO2 21* 15* 16*  GLUCOSE 193* 96 75  BUN 39* 37* 38*  CREATININE 1.53* 1.09* 1.08*  CALCIUM 8.2* 6.9* 7.6*   GFR: Estimated Creatinine Clearance: 48.5 mL/min (A) (by C-G formula based on SCr of 1.08 mg/dL (H)). Liver Function Tests: Recent Labs  Lab 12/30/18 0720 12/31/18 0442  AST 43* 49*  ALT 40 36  ALKPHOS 71 38  BILITOT 2.8* 1.5*  PROT 6.8 6.1*  ALBUMIN 3.7 3.1*   No results for input(s): LIPASE, AMYLASE in the last 168 hours. No results for input(s): AMMONIA in the last 168 hours. Coagulation Profile: Recent Labs  Lab 12/30/18 0720 12/31/18 0442 01/01/19 0453  INR 1.5* 2.1* 1.5*   Cardiac Enzymes: Recent Labs  Lab 12/30/18 0720  CKTOTAL 159   BNP (last 3 results) No results for input(s): PROBNP in the last 8760 hours. HbA1C: Recent Labs    12/30/18 1159  HGBA1C 8.3*   CBG: Recent Labs  Lab 12/31/18 1208 12/31/18 1708 12/31/18 2154 01/01/19 0733 01/01/19 1307  GLUCAP 99 102* 95 70 125*   Lipid Profile: No results for input(s): CHOL, HDL, LDLCALC, TRIG, CHOLHDL, LDLDIRECT in the  last 72 hours. Thyroid Function Tests: Recent Labs    12/30/18 1159  TSH 0.689   Anemia Panel: No results for input(s): VITAMINB12, FOLATE, FERRITIN, TIBC, IRON, RETICCTPCT in the last 72 hours. Sepsis Labs: Recent Labs  Lab 12/30/18 0720 12/30/18 1045 12/30/18 2254 12/31/18 0154  LATICACIDVEN 2.2* 1.8 2.0* 1.8    Recent Results (from the past 240 hour(s))  Blood Culture (routine x 2)     Status: None (Preliminary result)   Collection Time: 12/30/18  7:20 AM   Specimen: BLOOD  Result Value Ref Range Status   Specimen Description   Final    BLOOD PORTA CATH Performed at [facility] [address], [place], [place] [address]    Special Requests   Final    BOTTLES DRAWN AEROBIC AND ANAEROBIC  Blood Culture results may not be optimal due to an inadequate volume of blood received in culture bottles Performed at [facility] [address] [place], [place] [address]    Culture   Final    NO GROWTH 2 DAYS Performed at [facility] [address], [place], [place] [address]    Report Status PENDING  Incomplete  Blood Culture (routine x 2)     Status: None (Preliminary result)   Collection Time: 12/30/18  7:20 AM   Specimen: BLOOD  Result Value Ref Range Status   Specimen Description   Final    BLOOD RIGHT ANTECUBITAL Performed at [facility] [address] [place], [place] [address]    Special Requests   Final    BOTTLES DRAWN AEROBIC AND ANAEROBIC Blood Culture adequate volume Performed at [facility] [address], [place], [place] [address]    Culture   Final    NO GROWTH 2 DAYS Performed at [facility] [address], [place], [place] [address]    Report Status PENDING  Incomplete  Urine culture     Status: Abnormal   Collection Time: 12/30/18  9:17 AM   Specimen: In/Out Cath Urine  Result Value Ref Range Status   Specimen Description   Final    IN/OUT CATH URINE Performed at [facility] [address], [place], [place] [address]    Special Requests   Final    NONE Performed at [facility], [place] [address] [place],  [address]    Culture (A)  Final    90,000 COLONIES/mL ENTEROCOCCUS FAECALIS 80,000 COLONIES/mL METHICILLIN RESISTANT STAPHYLOCOCCUS AUREUS    Report Status 01/01/2019 FINAL  Final   Organism ID, Bacteria ENTEROCOCCUS FAECALIS (A)  Final   Organism ID, Bacteria METHICILLIN RESISTANT STAPHYLOCOCCUS AUREUS (A)  Final      Susceptibility   Enterococcus faecalis - MIC*    AMPICILLIN <=2 SENSITIVE Sensitive     LEVOFLOXACIN 0.25 SENSITIVE Sensitive     NITROFURANTOIN <=16 SENSITIVE Sensitive     VANCOMYCIN 1 SENSITIVE  Sensitive     * 90,000 COLONIES/mL ENTEROCOCCUS FAECALIS   Methicillin resistant staphylococcus aureus - MIC*    CIPROFLOXACIN 1 SENSITIVE Sensitive     GENTAMICIN <=0.5 SENSITIVE Sensitive     NITROFURANTOIN <=16 SENSITIVE Sensitive     OXACILLIN >=4 RESISTANT Resistant     TETRACYCLINE >=16 RESISTANT Resistant     VANCOMYCIN 1 SENSITIVE Sensitive     TRIMETH/SULFA <=10 SENSITIVE Sensitive     CLINDAMYCIN >=8 RESISTANT Resistant     RIFAMPIN >=32 RESISTANT Resistant     Inducible  Clindamycin NEGATIVE Sensitive     * 80,000 COLONIES/mL METHICILLIN RESISTANT STAPHYLOCOCCUS AUREUS  SARS CORONAVIRUS 2 (TAT 6-24 HRS) Nasopharyngeal Nasopharyngeal Swab     Status: None   Collection Time: 12/30/18  9:21 AM   Specimen: Nasopharyngeal Swab  Result Value Ref Range Status   SARS Coronavirus 2 NEGATIVE NEGATIVE Final    Comment: (NOTE) SARS-CoV-2 target nucleic acids are NOT DETECTED. The SARS-CoV-2 RNA is generally detectable in upper and lower respiratory specimens during the acute phase of infection. Negative results do not preclude SARS-CoV-2 infection, do not rule out co-infections with other pathogens, and should not be used as the sole basis for treatment or other patient management decisions. Negative results must be combined with clinical observations, patient history, and epidemiological information. The expected result is Negative. Fact Sheet for Patients: SugarRoll.be Fact Sheet for Healthcare Providers: https://www.woods-mathews.com/ This test is not yet approved or cleared by the [place] FDA and  has been authorized for detection and/or diagnosis of SARS-CoV-2 by FDA under an Emergency Use Authorization (EUA). This EUA will remain  in effect (meaning this test can be used) for the duration of the COVID-19 declaration under Section 56 4(b)(1) of the Act, 21 U.S.C. section 360bbb-3(b)(1), unless the authorization is terminated or  revoked sooner. Performed at [facility], [place] [address] [place], [place] [address]   MRSA PCR Screening     Status: None   Collection Time: 12/30/18 12:19 PM   Specimen: Nasal Mucosa; Nasopharyngeal  Result Value Ref Range Status   MRSA by PCR NEGATIVE NEGATIVE Final    Comment:        The GeneXpert MRSA Assay (FDA approved for NASAL specimens only), is one component of a comprehensive MRSA colonization surveillance program. It is not intended to diagnose MRSA infection nor to guide or monitor treatment for MRSA infections. Performed at [facility], [place] [address] [place], [place] [address]   C difficile quick scan w PCR reflex     Status: None   Collection Time: 12/31/18  7:52 AM   Specimen: Stool  Result Value Ref Range Status   C Diff antigen NEGATIVE NEGATIVE Final   C Diff toxin NEGATIVE NEGATIVE Final   C Diff interpretation No C. difficile detected.  Final    Comment: Performed at [facility], [place] [address], [place], [place] [address]  Gastrointestinal Panel by PCR , Stool     Status: None   Collection Time: 12/31/18  7:52 AM   Specimen: Stool  Result Value Ref Range Status   Campylobacter species NOT DETECTED NOT DETECTED Final   Plesimonas shigelloides NOT DETECTED NOT DETECTED Final   Salmonella species NOT DETECTED NOT DETECTED Final   Yersinia enterocolitica NOT DETECTED NOT DETECTED Final   Vibrio species NOT DETECTED NOT DETECTED Final   Vibrio cholerae NOT DETECTED NOT DETECTED Final   Enteroaggregative E coli (EAEC) NOT DETECTED NOT DETECTED Final   Enteropathogenic E coli (EPEC) NOT DETECTED NOT DETECTED Final   Enterotoxigenic E coli (ETEC) NOT DETECTED NOT DETECTED Final   Shiga like toxin producing E coli (STEC) NOT DETECTED NOT DETECTED Final   Shigella/Enteroinvasive E coli (EIEC) NOT DETECTED NOT DETECTED Final   Cryptosporidium NOT DETECTED NOT DETECTED Final   Cyclospora cayetanensis NOT DETECTED  NOT DETECTED Final   Entamoeba histolytica NOT DETECTED NOT DETECTED Final   Giardia lamblia NOT DETECTED NOT DETECTED Final   Adenovirus F40/41 NOT DETECTED NOT DETECTED Final   Astrovirus NOT  DETECTED NOT DETECTED Final   Norovirus GI/GII NOT DETECTED NOT DETECTED Final   Rotavirus A NOT DETECTED NOT DETECTED Final   Sapovirus (I, II, IV, and V) NOT DETECTED NOT DETECTED Final    Comment: Performed at [facility], [address], [place], [place] [address]         Radiology Studies: Vas [place] Upper Extremity Venous Duplex  Result Date: 12/31/2018 UPPER VENOUS STUDY  Indications: Erythema, Edema, and Pain status post IV Vancomycin Limitations: Interstitial edema and pain with touch. Comparison Study: No prior study on file for comparison Performing Technologist: Sharion Dove RVS  Examination Guidelines: A complete evaluation includes B-mode imaging, spectral Doppler, color Doppler, and power Doppler as needed of all accessible portions of each vessel. Bilateral testing is considered an integral part of a complete examination. Limited examinations for reoccurring indications may be performed as noted.  Right Findings: +----------+------------+---------+-----------+----------+---------------+ RIGHT     CompressiblePhasicitySpontaneousProperties    Summary     +----------+------------+---------+-----------+----------+---------------+ IJV           Full       Yes       Yes                              +----------+------------+---------+-----------+----------+---------------+ Subclavian    Full       Yes       Yes                              +----------+------------+---------+-----------+----------+---------------+ Axillary      Full       Yes       Yes                              +----------+------------+---------+-----------+----------+---------------+ Brachial      Full       Yes       Yes                               +----------+------------+---------+-----------+----------+---------------+ Radial                                              patent by color +----------+------------+---------+-----------+----------+---------------+ Ulnar                                               patent by color +----------+------------+---------+-----------+----------+---------------+ Cephalic      Full                                                  +----------+------------+---------+-----------+----------+---------------+ Basilic       Full                                                  +----------+------------+---------+-----------+----------+---------------+  Left Findings: +----------+------------+---------+-----------+----------+-------+ LEFT  CompressiblePhasicitySpontaneousPropertiesSummary +----------+------------+---------+-----------+----------+-------+ Subclavian               Yes       Yes                      +----------+------------+---------+-----------+----------+-------+  Summary:  Right: No evidence of deep vein thrombosis in the upper extremity. No evidence of superficial vein thrombosis in the upper extremity.  Left: No evidence of thrombosis in the subclavian.  *See table(s) above for measurements and observations.  Diagnosing physician: Monica Martinez MD Electronically signed by Monica Martinez MD on 12/31/2018 at 7:11:02 PM.    Final         Scheduled Meds: . acidophilus  2 capsule Oral TID  . Chlorhexidine Gluconate Cloth  6 each Topical Daily  . gabapentin  600 mg Oral BID  . guaiFENesin  600 mg Oral BID  . insulin aspart  0-9 Units Subcutaneous TID WC  . levofloxacin  500 mg Oral QHS  . mouth rinse  15 mL Mouth Rinse BID  . nitrofurantoin (macrocrystal-monohydrate)  100 mg Oral Q12H  . pantoprazole  40 mg Oral Daily  . potassium chloride  40 mEq Oral Daily  . rotigotine  1 patch Transdermal Daily  . sodium chloride flush  10-40 mL Intracatheter  Q12H   Continuous Infusions: . dextrose 5% lactated ringers 50 mL/hr at 01/01/19 1036     LOS: 2 days    Time spent: total 35 min spent on this encounter with >50% time on direct patient care and plan formulation.     Vicenta [place], MD [facility] Pager [phone]  If 7PM-7AM, please contact night-coverage [email or web address] Password [facility] 01/01/2019, 5:36 PM

## 2019-01-01 NOTE — Evaluation (Signed)
Occupational Therapy Evaluation Patient Details Name: Olivia Benson MRN: [id number] DOB: 1945-03-21 Today's Date: 01/01/2019    History of Present Illness "73 y.o. 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 She's found to have pancytopenia due to chemotherapy; severe severe sepsis (likely due to chemo induced mucositis) with neutropenic fever (started on broad coverage antibiotics pending cultures), lactic acidosis, hypotension requiring 6L NS bolus and pressor;  subdural hematoma and SAH, for which neurosurgery consulted, received platelet transfusion, FFP and vitamin K, stable SDH/SAH per repeated CTH"   Clinical Impression   Pt admitted with above. She demonstrates the below listed deficits and will benefit from continued OT to maximize safety and independence with BADLs.  Pt presents to OT severely deconditioned, with generalized weakness, impaired balance, decreased activity tolerance, and pain Rt UE and Rt LE.  She currently requires min A - total A for ADLs.  She demonstrates DOE 3/4 - 4/4 with activity with sats >94% on RA.   She lives with spouse and was fully independent, including working part time PTA.  She is not interested in SNF level rehab.  Anticipate she will progress well as pain subsides and activity tolerance improves.  Will follow.       Follow Up Recommendations  Home health OT;Supervision/Assistance - 24 hour    Equipment Recommendations       Recommendations for Other Services       Precautions / Restrictions Precautions Precautions: Fall Precaution Comments: R UE painful/tender Restrictions Weight Bearing Restrictions: No      Mobility Bed Mobility Overal bed mobility: Needs Assistance Bed Mobility: Supine to  Sit;Sit to Supine     Supine to sit: Mod assist Sit to supine: Mod assist;+2 for physical assistance   General bed mobility comments: assist to lift and lower trunk and to lift LEs onto bed  Transfers Overall transfer level: Needs assistance Equipment used: Rolling walker (2 wheeled) Transfers: Sit to/from Stand Sit to Stand: Mod assist;+2 safety/equipment         General transfer comment: assist to power up into standing.  She was able to side step up the side of the road with min A +2 for safety; performed standing twice, pt able to march in place briefly however effortful    Balance Overall balance assessment: Needs assistance Sitting-balance support: Feet supported Sitting balance-Leahy Scale: Fair Sitting balance - Comments: able to maintain EOB sitting with min gaurd assist    Standing balance support: Bilateral upper extremity supported Standing balance-Leahy Scale: Poor Standing balance comment: requires UE support and min A                            ADL either performed or assessed with clinical judgement   ADL Overall ADL's : Needs assistance/impaired Eating/Feeding: Independent;Bed level   Grooming: Wash/dry face;Wash/dry hands;Oral care;Brushing hair;Set up;Sitting   Upper Body Bathing: Moderate assistance;Sitting   Lower Body Bathing: Maximal assistance;Sit to/from stand   Upper Body Dressing : Moderate assistance;Sitting   Lower Body Dressing: Total assistance;Sit to/from stand   Toilet Transfer: Moderate assistance;+2 for safety/equipment;Stand-pivot;BSC;RW   Toileting- Clothing Manipulation and Hygiene: Maximal assistance;Sit to/from stand       Functional mobility during ADLs: Moderate assistance;+2 for physical assistance;+2 for safety/equipment General ADL Comments: limited by  pain and fatigue.   DOE 3/4      Vision Baseline Vision/History: Wears glasses Wears Glasses: At all times Patient Visual Report: No change from  baseline       Perception     Praxis      Pertinent Vitals/Pain Pain Assessment: 0-10 Pain Score: 10-Worst pain ever Pain Location: Rt UE and Rt hip  Pain Descriptors / Indicators: Aching;Grimacing;Guarding;Radiating;Sharp Pain Intervention(s): Monitored during session;Limited activity within patient's tolerance;Repositioned     Hand Dominance Right   Extremity/Trunk Assessment Upper Extremity Assessment Upper Extremity Assessment: RUE deficits/detail RUE Deficits / Details: Pt reports IV infilatrated.  Rt upper arm red and swollen.  She is very guarded with movement reporting pain 12/10   Lower Extremity Assessment Lower Extremity Assessment: Generalized weakness   Cervical / Trunk Assessment Cervical / Trunk Assessment: Normal   Communication Communication Communication: No difficulties   Cognition Arousal/Alertness: Awake/alert Behavior During Therapy: WFL for tasks assessed/performed Overall Cognitive Status: Within Functional Limits for tasks assessed                                     General Comments  DOE 3/4 - 4/4 with activity.  02 sats >94% on RA, HR to 114    Exercises     Shoulder Instructions      Home Living Family/patient expects to be discharged to:: Private residence Living Arrangements: Spouse/significant other Available Help at Discharge: Family;Available 24 hours/day Type of Home: House Home Access: Stairs to enter [employer] of Steps: 6 Entrance Stairs-Rails: Right;Left Home Layout: One level     Bathroom Shower/Tub: Occupational psychologist: Standard Bathroom Accessibility: Yes How Accessible: Accessible via walker Home Equipment: Grab bars - tub/shower;Crutches;Walker - 2 wheels;Bedside commode          Prior Functioning/Environment Level of Independence: Independent        Comments: Pt continues to work part time.         OT Problem List: Decreased strength;Decreased activity  tolerance;Impaired balance (sitting and/or standing);Decreased knowledge of use of DME or AE;Cardiopulmonary status limiting activity;Impaired UE functional use;Pain;Increased edema      OT Treatment/Interventions: Self-care/ADL training;Therapeutic exercise;Energy conservation;DME and/or AE instruction;Therapeutic activities;Patient/family education;Balance training    OT Goals(Current goals can be found in the care plan section) Acute Rehab OT Goals Patient Stated Goal: to get stronger and have less pain  OT Goal Formulation: With patient Time For Goal Achievement: 01/15/19 Potential to Achieve Goals: Good ADL Goals Pt Will Perform Grooming: with min guard assist;standing Pt Will Perform Upper Body Bathing: with set-up;with supervision;sitting Pt Will Perform Lower Body Bathing: with min assist;sit to/from stand Pt Will Perform Upper Body Dressing: with set-up;sitting Pt Will Perform Lower Body Dressing: with min guard assist;sit to/from stand Pt Will Transfer to Toilet: with min guard assist;ambulating;bedside commode;grab bars Pt Will Perform Toileting - Clothing Manipulation and hygiene: with min guard assist;sit to/from stand Pt/caregiver will Perform Home Exercise Program: Increased strength;With theraband;With written HEP provided;Independently  OT Frequency: Min 2X/week   Barriers to D/C:    Pt is not interested in SNF level rehab        Co-evaluation PT/OT/SLP Co-Evaluation/Treatment: Yes Reason for Co-Treatment: For patient/therapist safety;To address functional/ADL transfers;Complexity of the patient's impairments (multi-system involvement) PT goals addressed during session: Mobility/safety with mobility OT goals addressed during session: ADL's and self-care      AM-PAC OT "6 Clicks" Daily Activity  Outcome Measure Help from another person eating meals?: A Little Help from another person taking care of personal grooming?: A Little Help from another person  toileting, which includes using toliet, bedpan, or urinal?: A Lot Help from another person bathing (including washing, rinsing, drying)?: A Lot Help from another person to put on and taking off regular upper body clothing?: A Lot Help from another person to put on and taking off regular lower body clothing?: Total 6 Click Score: 13   End of Session Equipment Utilized During Treatment: Rolling walker Nurse Communication: Mobility status  Activity Tolerance: Patient limited by fatigue Patient left: in bed;with call bell/phone within reach  OT Visit Diagnosis: Unsteadiness on feet (R26.81);Pain Pain - Right/Left: Right Pain - part of body: Arm                Time: 1331-1402 OT Time Calculation (min): 31 min Charges:  OT General Charges $OT Visit: 1 Visit OT Evaluation $OT Eval Moderate Complexity: 1 Mod  Lucille Passy, OTR/L [place] Pager [phone] Office [phone]   Lucille Passy M 01/01/2019, 3:15 PM

## 2019-01-02 LAB — CBC WITH DIFFERENTIAL/PLATELET
Abs Immature Granulocytes: 0.38 10*3/uL — ABNORMAL HIGH (ref 0.00–0.07)
Basophils Absolute: 0.1 10*3/uL (ref 0.0–0.1)
Basophils Relative: 1 %
Eosinophils Absolute: 0 10*3/uL (ref 0.0–0.5)
Eosinophils Relative: 0 %
HCT: 26.8 % — ABNORMAL LOW (ref 36.0–46.0)
Hemoglobin: 8.5 g/dL — ABNORMAL LOW (ref 12.0–15.0)
Immature Granulocytes: 7 %
Lymphocytes Relative: 10 %
Lymphs Abs: 0.5 10*3/uL — ABNORMAL LOW (ref 0.7–4.0)
MCH: 31 pg (ref 26.0–34.0)
MCHC: 31.7 g/dL (ref 30.0–36.0)
MCV: 97.8 fL (ref 80.0–100.0)
Monocytes Absolute: 0.3 10*3/uL (ref 0.1–1.0)
Monocytes Relative: 5 %
Neutro Abs: 4 10*3/uL (ref 1.7–7.7)
Neutrophils Relative %: 77 %
Platelets: 66 10*3/uL — ABNORMAL LOW (ref 150–400)
RBC: 2.74 MIL/uL — ABNORMAL LOW (ref 3.87–5.11)
RDW: 16.7 % — ABNORMAL HIGH (ref 11.5–15.5)
WBC: 5.2 10*3/uL (ref 4.0–10.5)
nRBC: 0.4 % — ABNORMAL HIGH (ref 0.0–0.2)

## 2019-01-02 LAB — BASIC METABOLIC PANEL
Anion gap: 8 (ref 5–15)
BUN: 27 mg/dL — ABNORMAL HIGH (ref 8–23)
CO2: 19 mmol/L — ABNORMAL LOW (ref 22–32)
Calcium: 8.2 mg/dL — ABNORMAL LOW (ref 8.9–10.3)
Chloride: 113 mmol/L — ABNORMAL HIGH (ref 98–111)
Creatinine, Ser: 0.83 mg/dL (ref 0.44–1.00)
GFR calc Af Amer: >60 mL/min (ref >60)
GFR calc non Af Amer: >60 mL/min (ref >60)
Glucose, Bld: 161 mg/dL — ABNORMAL HIGH (ref 70–99)
Potassium: 3.4 mmol/L — ABNORMAL LOW (ref 3.5–5.1)
Sodium: 140 mmol/L (ref 135–145)

## 2019-01-02 LAB — GLUCOSE, CAPILLARY: Glucose-Capillary: 159 mg/dL — ABNORMAL HIGH (ref 70–99)

## 2019-01-02 LAB — URINALYSIS, ROUTINE W REFLEX MICROSCOPIC
Bilirubin Urine: NEGATIVE
Glucose, UA: 50 mg/dL — AB
Ketones, ur: NEGATIVE mg/dL
Leukocytes,Ua: NEGATIVE
Nitrite: NEGATIVE
Protein, ur: 30 mg/dL — AB
Specific Gravity, Urine: 1.017 (ref 1.005–1.030)
pH: 5 (ref 5.0–8.0)

## 2019-01-02 LAB — MAGNESIUM: Magnesium: 1.4 mg/dL — ABNORMAL LOW (ref 1.7–2.4)

## 2019-01-02 LAB — PROTIME-INR
INR: 1.2 (ref 0.8–1.2)
Prothrombin Time: 15.6 seconds — ABNORMAL HIGH (ref 11.4–15.2)

## 2019-01-02 NOTE — Progress Notes (Signed)
PROGRESS NOTE    Olivia Benson  XVQ:[id number] DOB: 11-06-1945 DOA: 12/30/2018 PCP: Gregor Hams, FNP   Brief Narrative:  Olivia Benson is a 73 y.o. 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 She's found to have pancytopenia due to chemotherapy; severe sepsis (likely due to chemo induced mucositis) with neutropenic fever (started on broad coverage antibiotics pending cultures), lactic acidosis, hypotension requiring 6L NS bolus and pressor;  subdural hematoma and SAH, for which neurosurgery consulted, received platelet transfusion, FFP and vitamin K, stable SDH/SAH per repeated CTH; mild AKI  Which resolved with IVF. Oncology consulted as well. Pending US venous doppler of RUE (new rash and swelling after iv Vancomycin). Pending GI panel and C diff PCR due to new onset diarrhea.  01/02/2019: T-max is 100.3 F.  Blood pressure is stable, 150/76 mmHg.  WBC is 5.2 today.  Platelet is 66.  Negative.  C. difficile is negative.  No diarrhea reported today.  Doppler ultrasound upper extremity was negative for DVT.  Cultures negative to date.  Urine culture grew Enterococcus faecalis and MRSA.  Patient is currently on oral Levaquin and nitrofurantoin.  Oncology input is appreciated, concerns for mucositis, on Magic mouthwash.   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[facility])   Diabetes mellitus with diabetic neuropathy, with long-term current use of insulin (HCC)   Liver cirrhosis secondary to NASH (HCC)   Fever and neutropenia (HCC)   Anemia associated with chemotherapy   Thrombocytopenia (HCC)   Mucositis due to antineoplastic therapy   Subdural hematoma (HCC)   SAH  (subarachnoid hemorrhage) (HCC)   Antineoplastic chemotherapy induced pancytopenia (HCC)   Sepsis (HCC)   Diarrhea   Redness and swelling of upper arm, right   Coagulopathy (HCC)   Hypocalcemia   Plan: Severe sepsis /neutropenic fever due to mucositis -Patient will be transferred out of ICU on 01/01/2019 as she does not seem to be critical anymore.  IV fluids and Levophed has been discontinued.  Patient is able to drink and eat on her own. Oncology input appreciated. Granix discontinued. Broad-spectrum vancomycin and cefepime will be tapered down to Levaquin for 5 more days as cultures are negative, she is afebrile and she is not neutropenic anymore. Antibiotics switched to Levaquin for neutropenic fever and nitrofurantoin due to VSE and   MRSA growing in urine about 80,000 colonies, most likely colonization/contamination, however given patient's neutropenic status, will treat nevertheless for 5 more days. 01/02/2019: Patient is improving.  Diarrhea has resolved.  Culture results as noted above.  Continue antibiotics for now.  PT OT and PT is appreciated.  Oncology input is appreciated.  Diarrhea: -Resolved.  -GI panel and C. difficile are negative.  -Syncope, fall, with SDH and SAH, neurosurgery consulted, pt received platelet transfusion, FFP and vitamin K, platelet monitored and still >=50, intracranial bleeding stable per repeated CT head. Continue to monitor fever, BP, and BS. Pt has no neurologic deficit. Defer to neurosurgery and oncology if further platelet needed.   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 As per oncology note, patient's baseline platelet is 80,000 -Platelet count today, 01/02/2019 is 66,000  AKI: -Resolved -Continue to monitor closely.  Diabetes Mellitus: Hemoglobin A1C 8.3% Continue Lantus and  SSI  Right arm swelling:  Vancomycin infiltration-arm is red and swollen.   Doppler ultrasound of the upper extremities came  back negative.     History of ovarian cancer-currently on chemotherapy.-Outpatient follow-up with oncology.   DVT prophylaxis: SCD Code Status: full code Family Communication:  Disposition Plan: pending clinical progress   Consultants:   Oncology; neurosurgery, critical care  Procedures:none  Antimicrobials: Levaquin and nitrofurantoin  Subjective: No fever.   Objective: Vitals:   01/01/19 1438 01/01/19 1831 01/01/19 2257 01/02/19 0557  BP: (!) 143/63  127/61 (!) 128/55  Pulse: (!) 104  80 78  Resp: 18  20 20   Temp: 100.3 F (37.9 C) 99.3 F (37.4 C) 98.6 F (37 C) 99.1 F (37.3 C)  TempSrc: Oral Oral Oral Oral  SpO2: 98%  97% 94%  Weight:      Height:       No intake or output data in the 24 hours ending 01/02/19 1209 Filed Weights   12/30/18 1038 12/31/18 0447 01/01/19 0450  Weight: 82.6 kg 88 kg 89 kg   GC - Not in distress.  Awake and alert. NECK - Supple.  No raised JVD. HEENT - Pallor.  No jaundice. Lungs - Clear to auscultation CVS - S1S2 ABD - Obese, soft and non tender. Organs are difficult to assess. Neuro - Awake and alert. Patient moves all extremities.  Data Reviewed: I have personally reviewed following labs and imaging studies  CBC: Recent Labs  Lab 12/30/18 0720 12/31/18 0442 01/01/19 0453 01/02/19 0500  WBC 1.5* 0.7* 2.8* 5.2  NEUTROABS 0.1* 0.2* 2.0 4.0  HGB 11.6* 9.9* 8.5* 8.5*  HCT 35.9* 30.8* 27.0* 26.8*  MCV 96.8 98.4 98.9 97.8  PLT 83* 50* 53* 66*   Basic Metabolic Panel: Recent Labs  Lab 12/30/18 0720 12/31/18 0442 01/01/19 0453 01/02/19 0500 01/02/19 0540  NA 133* 139 141 140  --   K 3.8 3.6 3.3* 3.4*  --   CL 102 113* 114* 113*  --   CO2 21* 15* 16* 19*  --   GLUCOSE 193* 96 75 161*  --   BUN 39* 37* 38* 27*  --   CREATININE 1.53* 1.09* 1.08* 0.83  --   CALCIUM 8.2* 6.9* 7.6* 8.2*  --   MG  --   --   --   --  1.4*   GFR: Estimated Creatinine Clearance: 63.1 mL/min (by C-G formula based on SCr of 0.83  mg/dL). Liver Function Tests: Recent Labs  Lab 12/30/18 0720 12/31/18 0442  AST 43* 49*  ALT 40 36  ALKPHOS 71 38  BILITOT 2.8* 1.5*  PROT 6.8 6.1*  ALBUMIN 3.7 3.1*   No results for input(s): LIPASE, AMYLASE in the last 168 hours. No results for input(s): AMMONIA in the last 168 hours. Coagulation Profile: Recent Labs  Lab 12/30/18 0720 12/31/18 0442 01/01/19 0453 01/02/19 0500  INR 1.5* 2.1* 1.5* 1.2   Cardiac Enzymes: Recent Labs  Lab 12/30/18 0720  CKTOTAL 159   BNP (last 3 results) No results for input(s): PROBNP in the last 8760 hours. HbA1C: No results for input(s): HGBA1C in the last 72 hours. CBG: Recent Labs  Lab 12/31/18 1708 12/31/18 2154 01/01/19 0733 01/01/19 1307 01/02/19 0806  GLUCAP 102* 95 70 125* 159*   Lipid Profile: No results for input(s): CHOL, HDL, LDLCALC, TRIG, CHOLHDL, LDLDIRECT in the last 72 hours. Thyroid Function Tests: No results for input(s): TSH, T4TOTAL, FREET4, T3FREE, THYROIDAB in the last 72 hours. Anemia Panel:  No results for input(s): VITAMINB12, FOLATE, FERRITIN, TIBC, IRON, RETICCTPCT in the last 72 hours. Sepsis Labs: Recent Labs  Lab 12/30/18 0720 12/30/18 1045 12/30/18 2254 12/31/18 0154  LATICACIDVEN 2.2* 1.8 2.0* 1.8    Recent Results (from the past 240 hour(s))  Blood Culture (routine x 2)     Status: None (Preliminary result)   Collection Time: 12/30/18  7:20 AM   Specimen: BLOOD  Result Value Ref Range Status   Specimen Description   Final    BLOOD PORTA CATH Performed at [facility] [address], [place], [place] [address]    Special Requests   Final    BOTTLES DRAWN AEROBIC AND ANAEROBIC Blood Culture results may not be optimal due to an inadequate volume of blood received in culture bottles Performed at [facility] [address], [place], [place] [address]    Culture   Final    NO GROWTH 3 DAYS Performed at [facility], [place] [address] [place], [place] [address]    Report Status PENDING  Incomplete  Blood Culture (routine x 2)     Status: None (Preliminary result)   Collection Time: 12/30/18  7:20 AM   Specimen: BLOOD  Result Value Ref Range Status   Specimen Description   Final    BLOOD RIGHT ANTECUBITAL Performed at [facility] [address] [place], [place] [address]    Special Requests   Final    BOTTLES DRAWN AEROBIC AND ANAEROBIC Blood Culture adequate volume Performed at [facility] [address], [place], [place] [address]    Culture   Final    NO GROWTH 3 DAYS Performed at [facility], [place] [address] [place], [place] [address]    Report Status PENDING  Incomplete  Urine culture     Status: Abnormal   Collection Time: 12/30/18  9:17 AM   Specimen: In/Out Cath Urine  Result Value Ref Range Status   Specimen Description   Final    IN/OUT CATH URINE Performed at [facility] [address] [place], [place] [address]    Special Requests   Final    NONE Performed at [facility], [place] [address], [place], [place] [address]    Culture (A)  Final    90,000 COLONIES/mL ENTEROCOCCUS FAECALIS 80,000 COLONIES/mL METHICILLIN RESISTANT STAPHYLOCOCCUS AUREUS    Report Status 01/01/2019 FINAL  Final   Organism ID, Bacteria ENTEROCOCCUS FAECALIS (A)  Final   Organism ID, Bacteria METHICILLIN RESISTANT STAPHYLOCOCCUS AUREUS (A)  Final      Susceptibility   Enterococcus faecalis - MIC*    AMPICILLIN <=2 SENSITIVE Sensitive     LEVOFLOXACIN 0.25 SENSITIVE Sensitive     NITROFURANTOIN <=16 SENSITIVE Sensitive     VANCOMYCIN 1 SENSITIVE Sensitive     * 90,000 COLONIES/mL ENTEROCOCCUS FAECALIS   Methicillin resistant staphylococcus aureus - MIC*    CIPROFLOXACIN 1 SENSITIVE Sensitive     GENTAMICIN <=0.5 SENSITIVE Sensitive     NITROFURANTOIN <=16 SENSITIVE Sensitive     OXACILLIN >=4 RESISTANT Resistant     TETRACYCLINE  >=16 RESISTANT Resistant     VANCOMYCIN 1 SENSITIVE Sensitive     TRIMETH/SULFA <=10 SENSITIVE Sensitive     CLINDAMYCIN >=8 RESISTANT Resistant     RIFAMPIN >=32 RESISTANT Resistant     Inducible Clindamycin NEGATIVE Sensitive     * 80,000 COLONIES/mL METHICILLIN RESISTANT STAPHYLOCOCCUS AUREUS  SARS CORONAVIRUS 2 (TAT 6-24  HRS) Nasopharyngeal Nasopharyngeal Swab     Status: None   Collection Time: 12/30/18  9:21 AM   Specimen: Nasopharyngeal Swab  Result Value Ref Range Status   SARS Coronavirus 2 NEGATIVE NEGATIVE Final    Comment: (NOTE) SARS-CoV-2 target nucleic acids are NOT DETECTED. The SARS-CoV-2 RNA is generally detectable in upper and lower respiratory specimens during the acute phase of infection. Negative results do not preclude SARS-CoV-2 infection, do not rule out co-infections with other pathogens, and should not be used as the sole basis for treatment or other patient management decisions. Negative results must be combined with clinical observations, patient history, and epidemiological information. The expected result is Negative. Fact Sheet for Patients: SugarRoll.be Fact Sheet for Healthcare Providers: https://www.woods-mathews.com/ This test is not yet approved or cleared by the [place] FDA and  has been authorized for detection and/or diagnosis of SARS-CoV-2 by FDA under an Emergency Use Authorization (EUA). This EUA will remain  in effect (meaning this test can be used) for the duration of the COVID-19 declaration under Section 56 4(b)(1) of the Act, 21 U.S.C. section 360bbb-3(b)(1), unless the authorization is terminated or revoked sooner. Performed at [facility], [place] [address] [place], [place] [address]   MRSA PCR Screening     Status: None   Collection Time: 12/30/18 12:19 PM   Specimen: Nasal Mucosa; Nasopharyngeal  Result Value Ref Range Status   MRSA by PCR NEGATIVE NEGATIVE Final     Comment:        The GeneXpert MRSA Assay (FDA approved for NASAL specimens only), is one component of a comprehensive MRSA colonization surveillance program. It is not intended to diagnose MRSA infection nor to guide or monitor treatment for MRSA infections. Performed at [facility], [place] [address] [place], [place] [address]   C difficile quick scan w PCR reflex     Status: None   Collection Time: 12/31/18  7:52 AM   Specimen: Stool  Result Value Ref Range Status   C Diff antigen NEGATIVE NEGATIVE Final   C Diff toxin NEGATIVE NEGATIVE Final   C Diff interpretation No C. difficile detected.  Final    Comment: Performed at [facility], [place] [address], [place],  [address]  Gastrointestinal Panel by PCR , Stool     Status: None   Collection Time: 12/31/18  7:52 AM   Specimen: Stool  Result Value Ref Range Status   Campylobacter species NOT DETECTED NOT DETECTED Final   Plesimonas shigelloides NOT DETECTED NOT DETECTED Final   Salmonella species NOT DETECTED NOT DETECTED Final   Yersinia enterocolitica NOT DETECTED NOT DETECTED Final   Vibrio species NOT DETECTED NOT DETECTED Final   Vibrio cholerae NOT DETECTED NOT DETECTED Final   Enteroaggregative E coli (EAEC) NOT DETECTED NOT DETECTED Final   Enteropathogenic E coli (EPEC) NOT DETECTED NOT DETECTED Final   Enterotoxigenic E coli (ETEC) NOT DETECTED NOT DETECTED Final   Shiga like toxin producing E coli (STEC) NOT DETECTED NOT DETECTED Final   Shigella/Enteroinvasive E coli (EIEC) NOT DETECTED NOT DETECTED Final   Cryptosporidium NOT DETECTED NOT DETECTED Final   Cyclospora cayetanensis NOT DETECTED NOT DETECTED Final   Entamoeba histolytica NOT DETECTED NOT DETECTED Final   Giardia lamblia NOT DETECTED NOT DETECTED Final   Adenovirus F40/41 NOT DETECTED NOT DETECTED Final   Astrovirus NOT DETECTED NOT DETECTED Final   Norovirus GI/GII NOT DETECTED NOT DETECTED Final    Rotavirus A NOT DETECTED NOT  DETECTED Final   Sapovirus (I, II, IV, and V) NOT DETECTED NOT DETECTED Final    Comment: Performed at [facility], [address], [place], [place] [address]         Radiology Studies: No results found.      Scheduled Meds: . acidophilus  2 capsule Oral TID  . Chlorhexidine Gluconate Cloth  6 each Topical Daily  . gabapentin  600 mg Oral BID  . guaiFENesin  600 mg Oral BID  . insulin aspart  0-9 Units Subcutaneous TID WC  . levofloxacin  500 mg Oral QHS  . mouth rinse  15 mL Mouth Rinse BID  . nitrofurantoin (macrocrystal-monohydrate)  100 mg Oral Q12H  . pantoprazole  40 mg Oral Daily  . potassium chloride  40 mEq Oral Daily  . rotigotine  1 patch Transdermal Daily  . sodium chloride flush  10-40 mL Intracatheter Q12H   Continuous Infusions: . dextrose 5% lactated ringers 50 mL/hr at 01/01/19 1036     LOS: 3 days    Bonnell Public, MD [facility]  If 7PM-7AM, please contact night-coverage [email or web address] Password [facility] 01/02/2019, 12:09 PM

## 2019-01-03 LAB — CBC WITH DIFFERENTIAL/PLATELET
Abs Immature Granulocytes: 0.27 10*3/uL — ABNORMAL HIGH (ref 0.00–0.07)
Basophils Absolute: 0 10*3/uL (ref 0.0–0.1)
Basophils Relative: 1 %
Eosinophils Absolute: 0 10*3/uL (ref 0.0–0.5)
Eosinophils Relative: 0 %
HCT: 26.1 % — ABNORMAL LOW (ref 36.0–46.0)
Hemoglobin: 8.1 g/dL — ABNORMAL LOW (ref 12.0–15.0)
Immature Granulocytes: 5 %
Lymphocytes Relative: 14 %
Lymphs Abs: 0.8 10*3/uL (ref 0.7–4.0)
MCH: 30.6 pg (ref 26.0–34.0)
MCHC: 31 g/dL (ref 30.0–36.0)
MCV: 98.5 fL (ref 80.0–100.0)
Monocytes Absolute: 0.3 10*3/uL (ref 0.1–1.0)
Monocytes Relative: 6 %
Neutro Abs: 4 10*3/uL (ref 1.7–7.7)
Neutrophils Relative %: 74 %
Platelets: 72 10*3/uL — ABNORMAL LOW (ref 150–400)
RBC: 2.65 MIL/uL — ABNORMAL LOW (ref 3.87–5.11)
RDW: 16.9 % — ABNORMAL HIGH (ref 11.5–15.5)
WBC: 5.4 10*3/uL (ref 4.0–10.5)
nRBC: 0.4 % — ABNORMAL HIGH (ref 0.0–0.2)

## 2019-01-03 LAB — RENAL FUNCTION PANEL
Albumin: 2.5 g/dL — ABNORMAL LOW (ref 3.5–5.0)
Anion gap: 5 (ref 5–15)
BUN: 21 mg/dL (ref 8–23)
CO2: 21 mmol/L — ABNORMAL LOW (ref 22–32)
Calcium: 8 mg/dL — ABNORMAL LOW (ref 8.9–10.3)
Chloride: 113 mmol/L — ABNORMAL HIGH (ref 98–111)
Creatinine, Ser: 0.74 mg/dL (ref 0.44–1.00)
GFR calc Af Amer: >60 mL/min (ref >60)
GFR calc non Af Amer: >60 mL/min (ref >60)
Glucose, Bld: 182 mg/dL — ABNORMAL HIGH (ref 70–99)
Phosphorus: 1.4 mg/dL — ABNORMAL LOW (ref 2.5–4.6)
Potassium: 3.6 mmol/L (ref 3.5–5.1)
Sodium: 139 mmol/L (ref 135–145)

## 2019-01-03 LAB — GLUCOSE, CAPILLARY
Glucose-Capillary: 140 mg/dL — ABNORMAL HIGH (ref 70–99)
Glucose-Capillary: 168 mg/dL — ABNORMAL HIGH (ref 70–99)
Glucose-Capillary: 161 mg/dL — ABNORMAL HIGH (ref 70–99)
Glucose-Capillary: 209 mg/dL — ABNORMAL HIGH (ref 70–99)

## 2019-01-03 LAB — MAGNESIUM: Magnesium: 1.3 mg/dL — ABNORMAL LOW (ref 1.7–2.4)

## 2019-01-03 LAB — PROTIME-INR
INR: 1.2 (ref 0.8–1.2)
Prothrombin Time: 14.9 seconds (ref 11.4–15.2)

## 2019-01-03 MED ORDER — MAGNESIUM SULFATE 50 % IJ SOLN
3.0000 g | Freq: Once | INTRAVENOUS | Status: AC
  Administered 2019-01-03: 3 g via INTRAVENOUS
  Filled 2019-01-03: qty 6

## 2019-01-03 MED ORDER — POTASSIUM PHOSPHATES 15 MMOLE/5ML IV SOLN
30.0000 mmol | Freq: Once | INTRAVENOUS | Status: AC
  Administered 2019-01-03: 30 mmol via INTRAVENOUS
  Filled 2019-01-03: qty 10

## 2019-01-03 NOTE — Progress Notes (Signed)
PROGRESS NOTE    Olivia [place]  HFG:[id number] DOB: November 01, 1945 DOA: 12/30/2018 PCP: Gregor Hams, FNP   Brief Narrative:  Olivia [place] is a 73 y.o. 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  Patient was admitted on 11/18 after a fall due to syncope episode.  On presentation, patient was hypotensive requiring 6L NS bolus and pressor.  Imaging studies done following the fall revealed subdural hematoma and SAH, for which neurosurgery was consulted and no further intervention is planned.  Patient received platelet transfusion, FFP and vitamin K.  Repeat CT Head revealed stable SDH/SAH.  Mild AKI was noted that resolved with IVF.  Pancytopenia with neutropenic fever noted, likely secondary to chemotherapy.  Concerns for chemotherapy induced mucositis were also documented.  Patient was started on broad spectrum antibiotics.  Blood cultures have not grown any organisms till date. Urine culture grew enterococcus faecalis and MRSA.  Patient is currently on oral Levaquin and Bactrim.  Venous doppler US came back negative.  Diarrhea has resolved, with negative C Diff and GI panel.  Oncology input is appreciated.  PT/OT consulted.     01/03/2019: Fever has resolved.  Neutropenia has resolved.  Platelet is 72, close to baseline of around 80.  WBC today is 5.4.  Magnesium today was 1.3 and phos 1.4, likely related to diarrhea.  Patient is not keen on being discharged back home today.  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[place])   Diabetes mellitus with diabetic neuropathy, with long-term current use of insulin (HCC)   Liver cirrhosis secondary to NASH (HCC)   Fever and neutropenia (HCC)   Anemia associated with chemotherapy  Thrombocytopenia (HCC)   Mucositis due to antineoplastic therapy   Subdural hematoma (HCC)   SAH (subarachnoid hemorrhage) (HCC)   Antineoplastic chemotherapy induced pancytopenia (HCC)   Sepsis (HCC)   Diarrhea   Redness and swelling of upper arm, right   Coagulopathy (HCC)   Hypocalcemia   Severe sepsis /neutropenic fever due to mucositis -Patient was transferred out of ICU on 01/01/2019.  Volume resuscitation and Levophed discontinued.   -Blood cultures have not grown any organisms till date -Urine culture grew Enterococcus faecalis and MRSA -Patient is currently on oral Levaquin and Nitrofurantoin -Neutropenia has resolved -Fever has resolved -Diarrhea has resolved -Await PT/OT input, and pursue disposition.  Diarrhea: -Resolved.  -GI panel and C. difficile are negative.  Fall/Syncope: -Imaging revealed SDH and SAH, neurosurgery consulted, pt received platelet transfusion, FFP and vitamin K, platelet monitored and still >=50, intracranial bleeding stable per repeated CT head. -Awaiting PT/OT input prior to discharge.     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 As per oncology note, patient's baseline platelet is 80,000 -Platelet count today 72,000.  AKI: -Resolved -Continue to monitor closely.  Diabetes Mellitus: Hemoglobin A1C 8.3% Continue Lantus and SSI  Right arm swelling:  Vancomycin infiltration-arm is red and swollen.   Doppler ultrasound of the upper extremities came back negative.    History of ovarian cancer-currently on chemotherapy.-Outpatient follow-up with oncology.  DVT prophylaxis: SCD Code Status: full code Family Communication:  Disposition Plan: pending clinical progress  Consultants:   Oncology; neurosurgery, critical care  Procedures:none  Antimicrobials: Levaquin and nitrofurantoin  Subjective: No fever.  No diarrhea  Objective: Vitals:   01/02/19  1539 01/02/19 2232 01/03/19 0500 01/03/19  0644  BP: (!) 152/76 (!) 118/48  (!) 130/57  Pulse: 86 89  73  Resp: 14 18  18   Temp: 98.1 F (36.7 C) 99.2 F (37.3 C)  98.3 F (36.8 C)  TempSrc: Oral Oral  Oral  SpO2: 100% 97%  97%  Weight:   93.2 kg   Height:        Intake/Output Summary (Last 24 hours) at 01/03/2019 0954 Last data filed at 01/03/2019 0545 Gross per 24 hour  Intake 2474.49 ml  Output 250 ml  Net 2224.49 ml   Filed Weights   12/31/18 0447 01/01/19 0450 01/03/19 0500  Weight: 88 kg 89 kg 93.2 kg   GC - Not in distress.  Awake and alert. NECK - Supple.  No raised JVD. HEENT - Pallor.  No jaundice. Lungs - Clear to auscultation CVS - S1S2 ABD - Obese, soft and non tender. Organs are difficult to assess. Neuro - Awake and alert. Patient moves all extremities.  Data Reviewed: I have personally reviewed following labs and imaging studies  CBC: Recent Labs  Lab 12/30/18 0720 12/31/18 0442 01/01/19 0453 01/02/19 0500 01/03/19 0350  WBC 1.5* 0.7* 2.8* 5.2 5.4  NEUTROABS 0.1* 0.2* 2.0 4.0 4.0  HGB 11.6* 9.9* 8.5* 8.5* 8.1*  HCT 35.9* 30.8* 27.0* 26.8* 26.1*  MCV 96.8 98.4 98.9 97.8 98.5  PLT 83* 50* 53* 66* 72*   Basic Metabolic Panel: Recent Labs  Lab 12/30/18 0720 12/31/18 0442 01/01/19 0453 01/02/19 0500 01/02/19 0540 01/03/19 0350  NA 133* 139 141 140  --  139  K 3.8 3.6 3.3* 3.4*  --  3.6  CL 102 113* 114* 113*  --  113*  CO2 21* 15* 16* 19*  --  21*  GLUCOSE 193* 96 75 161*  --  182*  BUN 39* 37* 38* 27*  --  21  CREATININE 1.53* 1.09* 1.08* 0.83  --  0.74  CALCIUM 8.2* 6.9* 7.6* 8.2*  --  8.0*  MG  --   --   --   --  1.4* 1.3*  PHOS  --   --   --   --   --  1.4*   GFR: Estimated Creatinine Clearance: 67.2 mL/min (by C-G formula based on SCr of 0.74 mg/dL). Liver Function Tests: Recent Labs  Lab 12/30/18 0720 12/31/18 0442 01/03/19 0350  AST 43* 49*  --   ALT 40 36  --   ALKPHOS 71 38  --   BILITOT 2.8* 1.5*  --   PROT 6.8 6.1*  --   ALBUMIN 3.7 3.1* 2.5*   No  results for input(s): LIPASE, AMYLASE in the last 168 hours. No results for input(s): AMMONIA in the last 168 hours. Coagulation Profile: Recent Labs  Lab 12/30/18 0720 12/31/18 0442 01/01/19 0453 01/02/19 0500 01/03/19 0350  INR 1.5* 2.1* 1.5* 1.2 1.2   Cardiac Enzymes: Recent Labs  Lab 12/30/18 0720  CKTOTAL 159   BNP (last 3 results) No results for input(s): PROBNP in the last 8760 hours. HbA1C: No results for input(s): HGBA1C in the last 72 hours. CBG: Recent Labs  Lab 12/31/18 2154 01/01/19 0733 01/01/19 1307 01/02/19 0806 01/03/19 0720  GLUCAP 95 70 125* 159* 140*   Lipid Profile: No results for input(s): CHOL, HDL, LDLCALC, TRIG, CHOLHDL, LDLDIRECT in the last 72 hours. Thyroid Function Tests: No results for input(s): TSH, T4TOTAL, FREET4, T3FREE, THYROIDAB in the last 72 hours. Anemia Panel: No results for  input(s): VITAMINB12, FOLATE, FERRITIN, TIBC, IRON, RETICCTPCT in the last 72 hours. Sepsis Labs: Recent Labs  Lab 12/30/18 0720 12/30/18 1045 12/30/18 2254 12/31/18 0154  LATICACIDVEN 2.2* 1.8 2.0* 1.8    Recent Results (from the past 240 hour(s))  Blood Culture (routine x 2)     Status: None (Preliminary result)   Collection Time: 12/30/18  7:20 AM   Specimen: BLOOD  Result Value Ref Range Status   Specimen Description   Final    BLOOD PORTA CATH Performed at [facility] [address] [place], [place] [address]    Special Requests   Final    BOTTLES DRAWN AEROBIC AND ANAEROBIC Blood Culture results may not be optimal due to an inadequate volume of blood received in culture bottles Performed at [facility] [address], [place], [place] [address]    Culture   Final    NO GROWTH 4 DAYS Performed at [facility], [place] [address], [place], [place] [address]    Report Status PENDING  Incomplete  Blood Culture (routine x 2)     Status: None (Preliminary result)   Collection Time: 12/30/18  7:20  AM   Specimen: BLOOD  Result Value Ref Range Status   Specimen Description   Final    BLOOD RIGHT ANTECUBITAL Performed at [facility] [address] [place], [place] [address]    Special Requests   Final    BOTTLES DRAWN AEROBIC AND ANAEROBIC Blood Culture adequate volume Performed at [facility] [address] [place], [place] [address]    Culture   Final    NO GROWTH 4 DAYS Performed at [facility], [place] [address], [place], [place] [address]    Report Status PENDING  Incomplete  Urine culture     Status: Abnormal   Collection Time: 12/30/18  9:17 AM   Specimen: In/Out Cath Urine  Result Value Ref Range Status   Specimen Description   Final    IN/OUT CATH URINE Performed at [facility] [address], [place], [place] [address]    Special Requests   Final    NONE Performed at [facility], [place] [address], [place],  [address]    Culture (A)  Final    90,000 COLONIES/mL ENTEROCOCCUS FAECALIS 80,000 COLONIES/mL METHICILLIN RESISTANT STAPHYLOCOCCUS AUREUS    Report Status 01/01/2019 FINAL  Final   Organism ID, Bacteria ENTEROCOCCUS FAECALIS (A)  Final   Organism ID, Bacteria METHICILLIN RESISTANT STAPHYLOCOCCUS AUREUS (A)  Final      Susceptibility   Enterococcus faecalis - MIC*    AMPICILLIN <=2 SENSITIVE Sensitive     LEVOFLOXACIN 0.25 SENSITIVE Sensitive     NITROFURANTOIN <=16 SENSITIVE Sensitive     VANCOMYCIN 1 SENSITIVE Sensitive     * 90,000 COLONIES/mL ENTEROCOCCUS FAECALIS   Methicillin resistant staphylococcus aureus - MIC*    CIPROFLOXACIN 1 SENSITIVE Sensitive     GENTAMICIN <=0.5 SENSITIVE Sensitive     NITROFURANTOIN <=16 SENSITIVE Sensitive     OXACILLIN >=4 RESISTANT Resistant     TETRACYCLINE >=16 RESISTANT Resistant     VANCOMYCIN 1 SENSITIVE Sensitive     TRIMETH/SULFA <=10 SENSITIVE Sensitive     CLINDAMYCIN >=8 RESISTANT Resistant     RIFAMPIN >=32  RESISTANT Resistant     Inducible Clindamycin NEGATIVE Sensitive     * 80,000 COLONIES/mL METHICILLIN RESISTANT STAPHYLOCOCCUS AUREUS  SARS CORONAVIRUS 2 (TAT 6-24 HRS) Nasopharyngeal Nasopharyngeal  Swab     Status: None   Collection Time: 12/30/18  9:21 AM   Specimen: Nasopharyngeal Swab  Result Value Ref Range Status   SARS Coronavirus 2 NEGATIVE NEGATIVE Final    Comment: (NOTE) SARS-CoV-2 target nucleic acids are NOT DETECTED. The SARS-CoV-2 RNA is generally detectable in upper and lower respiratory specimens during the acute phase of infection. Negative results do not preclude SARS-CoV-2 infection, do not rule out co-infections with other pathogens, and should not be used as the sole basis for treatment or other patient management decisions. Negative results must be combined with clinical observations, patient history, and epidemiological information. The expected result is Negative. Fact Sheet for Patients: SugarRoll.be Fact Sheet for Healthcare Providers: https://www.woods-mathews.com/ This test is not yet approved or cleared by the [place] FDA and  has been authorized for detection and/or diagnosis of SARS-CoV-2 by FDA under an Emergency Use Authorization (EUA). This EUA will remain  in effect (meaning this test can be used) for the duration of the COVID-19 declaration under Section 56 4(b)(1) of the Act, 21 U.S.C. section 360bbb-3(b)(1), unless the authorization is terminated or revoked sooner. Performed at [facility], [place] [address] [place], [place] [address]   MRSA PCR Screening     Status: None   Collection Time: 12/30/18 12:19 PM   Specimen: Nasal Mucosa; Nasopharyngeal  Result Value Ref Range Status   MRSA by PCR NEGATIVE NEGATIVE Final    Comment:        The GeneXpert MRSA Assay (FDA approved for NASAL specimens only), is one component of a comprehensive MRSA colonization surveillance program. It is not  intended to diagnose MRSA infection nor to guide or monitor treatment for MRSA infections. Performed at [facility], [place] [address], [place], [place] [address]   C difficile quick scan w PCR reflex     Status: None   Collection Time: 12/31/18  7:52 AM   Specimen: Stool  Result Value Ref Range Status   C Diff antigen NEGATIVE NEGATIVE Final   C Diff toxin NEGATIVE NEGATIVE Final   C Diff interpretation No C. difficile detected.  Final    Comment: Performed at [facility], [place] [address], [place], [place] [address]  Gastrointestinal Panel by PCR , Stool     Status: None   Collection Time: 12/31/18  7:52 AM   Specimen: Stool  Result Value Ref Range Status   Campylobacter species NOT DETECTED NOT DETECTED Final   Plesimonas shigelloides NOT DETECTED NOT DETECTED Final   Salmonella species NOT DETECTED NOT DETECTED Final   Yersinia enterocolitica NOT DETECTED NOT DETECTED Final   Vibrio species NOT DETECTED NOT DETECTED Final   Vibrio cholerae NOT DETECTED NOT DETECTED Final   Enteroaggregative E coli (EAEC) NOT DETECTED NOT DETECTED Final   Enteropathogenic E coli (EPEC) NOT DETECTED NOT DETECTED Final   Enterotoxigenic E coli (ETEC) NOT DETECTED NOT DETECTED Final   Shiga like toxin producing E coli (STEC) NOT DETECTED NOT DETECTED Final   Shigella/Enteroinvasive E coli (EIEC) NOT DETECTED NOT DETECTED Final   Cryptosporidium NOT DETECTED NOT DETECTED Final   Cyclospora cayetanensis NOT DETECTED NOT DETECTED Final   Entamoeba histolytica NOT DETECTED NOT DETECTED Final   Giardia lamblia NOT DETECTED NOT DETECTED Final   Adenovirus F40/41 NOT DETECTED NOT DETECTED Final   Astrovirus NOT DETECTED NOT DETECTED Final   Norovirus GI/GII NOT DETECTED NOT DETECTED Final   Rotavirus A NOT DETECTED NOT DETECTED Final  Sapovirus (I, II, IV, and V) NOT DETECTED NOT DETECTED Final    Comment: Performed at [facility], [address]  [place], [place] [address]         Radiology Studies: No results found.      Scheduled Meds: . Chlorhexidine Gluconate Cloth  6 each Topical Daily  . gabapentin  600 mg Oral BID  . guaiFENesin  600 mg Oral BID  . insulin aspart  0-9 Units Subcutaneous TID WC  . levofloxacin  500 mg Oral QHS  . mouth rinse  15 mL Mouth Rinse BID  . nitrofurantoin (macrocrystal-monohydrate)  100 mg Oral Q12H  . pantoprazole  40 mg Oral Daily  . potassium chloride  40 mEq Oral Daily  . rotigotine  1 patch Transdermal Daily  . sodium chloride flush  10-40 mL Intracatheter Q12H   Continuous Infusions: . dextrose 5% lactated ringers 50 mL/hr at 01/03/19 0545  . magnesium sulfate bolus IVPB    . potassium PHOSPHATE IVPB (in mmol)       LOS: 4 days    Bonnell Public, MD [facility]  If 7PM-7AM, please contact night-coverage [email or web address] Password [facility] 01/03/2019, 9:54 AM

## 2019-01-04 ENCOUNTER — Other Ambulatory Visit: Payer: Self-pay | Admitting: Hematology and Oncology

## 2019-01-04 ENCOUNTER — Telehealth: Payer: Self-pay

## 2019-01-04 DIAGNOSIS — C801 Malignant (primary) neoplasm, unspecified: Secondary | ICD-10-CM

## 2019-01-04 DIAGNOSIS — D689 Coagulation defect, unspecified: Secondary | ICD-10-CM

## 2019-01-04 DIAGNOSIS — C786 Secondary malignant neoplasm of retroperitoneum and peritoneum: Secondary | ICD-10-CM

## 2019-01-04 DIAGNOSIS — T801XXA Vascular complications following infusion, transfusion and therapeutic injection, initial encounter: Secondary | ICD-10-CM

## 2019-01-04 DIAGNOSIS — I809 Phlebitis and thrombophlebitis of unspecified site: Secondary | ICD-10-CM

## 2019-01-04 DIAGNOSIS — I5032 Chronic diastolic (congestive) heart failure: Secondary | ICD-10-CM

## 2019-01-04 DIAGNOSIS — R55 Syncope and collapse: Secondary | ICD-10-CM | POA: Insufficient documentation

## 2019-01-04 DIAGNOSIS — N390 Urinary tract infection, site not specified: Secondary | ICD-10-CM

## 2019-01-04 LAB — GLUCOSE, CAPILLARY
Glucose-Capillary: 159 mg/dL — ABNORMAL HIGH (ref 70–99)
Glucose-Capillary: 131 mg/dL — ABNORMAL HIGH (ref 70–99)
Glucose-Capillary: 212 mg/dL — ABNORMAL HIGH (ref 70–99)
Glucose-Capillary: 184 mg/dL — ABNORMAL HIGH (ref 70–99)
Glucose-Capillary: 175 mg/dL — ABNORMAL HIGH (ref 70–99)

## 2019-01-04 LAB — URINE CULTURE: Culture: NO GROWTH

## 2019-01-04 LAB — CBC WITH DIFFERENTIAL/PLATELET
Abs Immature Granulocytes: 0.4 K/uL — ABNORMAL HIGH (ref 0.00–0.07)
Basophils Absolute: 0.1 K/uL (ref 0.0–0.1)
Basophils Relative: 1 %
Eosinophils Absolute: 0 K/uL (ref 0.0–0.5)
Eosinophils Relative: 0 %
HCT: 27.1 % — ABNORMAL LOW (ref 36.0–46.0)
Hemoglobin: 8.4 g/dL — ABNORMAL LOW (ref 12.0–15.0)
Immature Granulocytes: 7 %
Lymphocytes Relative: 14 %
Lymphs Abs: 0.8 K/uL (ref 0.7–4.0)
MCH: 30.5 pg (ref 26.0–34.0)
MCHC: 31 g/dL (ref 30.0–36.0)
MCV: 98.5 fL (ref 80.0–100.0)
Monocytes Absolute: 0.3 K/uL (ref 0.1–1.0)
Monocytes Relative: 6 %
Neutro Abs: 4.1 K/uL (ref 1.7–7.7)
Neutrophils Relative %: 72 %
Platelets: 84 K/uL — ABNORMAL LOW (ref 150–400)
RBC: 2.75 MIL/uL — ABNORMAL LOW (ref 3.87–5.11)
RDW: 16.8 % — ABNORMAL HIGH (ref 11.5–15.5)
WBC: 5.6 K/uL (ref 4.0–10.5)
nRBC: 0.5 % — ABNORMAL HIGH (ref 0.0–0.2)

## 2019-01-04 LAB — CULTURE, BLOOD (ROUTINE X 2)
Culture: NO GROWTH
Culture: NO GROWTH
Special Requests: ADEQUATE

## 2019-01-04 MED ORDER — MAGIC MOUTHWASH W/LIDOCAINE
5.0000 mL | Freq: Four times a day (QID) | ORAL | Status: DC | PRN
  Filled 2019-01-04: qty 5

## 2019-01-04 MED ORDER — LEVOFLOXACIN 500 MG PO TABS: 500.0000 mg | ORAL_TABLET | Freq: Every day | ORAL | 0 refills | Status: AC

## 2019-01-04 MED ORDER — NITROFURANTOIN MONOHYD MACRO 100 MG PO CAPS: 100.0000 mg | ORAL_CAPSULE | Freq: Two times a day (BID) | ORAL | 0 refills | Status: AC

## 2019-01-04 MED ORDER — MAGIC MOUTHWASH W/LIDOCAINE: 5.0000 mL | Freq: Four times a day (QID) | ORAL | 0 refills | Status: AC | PRN

## 2019-01-04 MED ORDER — HEPARIN SOD (PORK) LOCK FLUSH 100 UNIT/ML IV SOLN
500.0000 [IU] | INTRAVENOUS | Status: AC | PRN
  Administered 2019-01-04: 500 [IU]
  Filled 2019-01-04: qty 5

## 2019-01-04 NOTE — Progress Notes (Addendum)
Olivia Benson   DOB:[birth year]   DT#:[id number]   I have seen the patient, examined her and agree with the documentation as follows ASSESSMENT & PLAN:  Recurrent primary peritoneal carcinomatosis She had received chemotherapy on November 9th, complicated by neutropenic phase. 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 Her baseline platelet count is around 80,000. She does not need transfusion of blood or platelets today I recommend keeping her platelet count 50,000 and higher  Subdural hematoma/intracranial hemorrhage secondary to fall I agree with transfusion support but I want to set an expectation, due to liver cirrhosis and splenomegaly, even with aggressive platelet transfusion, I do not expect her platelet count to be normalized  Acquired coagulopathy Secondary to baseline liver cirrhosis and possibly mild deficiency of coagulation protein from aggressive antimicrobial therapy Received 4 units of fresh frozen plasma on 12/31/2018 I plan to transfuse to keep INR 1.5 or less if possible due to recent traumatic fall INR not checked today; was 1.2 on 01/03/2019 She does not need fresh frozen plasma infusion today  Acute renal failure, resolved Likely due to dehydration  Neutropenic fever, resolved Urinalysis and urine culture show mixed growth Chest x-ray is negative The most likely source is likely oropharynx given her complaints at home with mucositis pain and possibly UTI Received  broad-spectrum IV antibiotics and now on oral Levaquin and Macrobid  Diarrhea, resolved Likely secondary to antibiotics treatment  Oral mucositis secondary to chemotherapy, resolving Has Magic Mouthwash PRN  Code Status Full code  Goals of care Neutropenia and sepsis resolved  Discharge planning Can discharge today from our standpoint if otherwise medically stable.   All questions were answered. The patient knows to call the clinic with any problems, questions or concerns.All questions were answered. The patient knows to call the clinic with any problems, questions or concerns.   Olivia Bussing, NP 01/04/2019 9:18 AM [name], MD  Subjective:  Feels well this morning.  Wants to go home.  Her husband is present She has some swelling and redness in her right upper extremity, Doppler ultrasound was negative on 12/31/2018. She denies pain   Mucositis resolved Diarrhea is resolving No fevers The patient denies any recent signs or symptoms of bleeding such as spontaneous epistaxis, hematuria or hematochezia.   Objective:  Vitals:   01/03/19 2045 01/04/19 0441  BP: (!) 156/64 (!) 162/69  Pulse: 87 81  Resp: 18 18  Temp: 99.3 F (37.4 C) 97.9 F (36.6 C)  SpO2: 95% 97%     Intake/Output Summary (Last 24 hours) at 01/04/2019 9983 Last data filed at 01/04/2019 0556 Gross per 24 hour  Intake 1832.33 ml  Output -  Net 1832.33 ml    GENERAL:alert, no distress and comfortable SKIN: Noted cellulitis of the upper arm EYES: normal, Conjunctiva are pink and non-injected, sclera clear OROPHARYNX: No ulceration noted in her oropharynx or thrush NECK: supple, thyroid normal size, non-tender, without nodularity LYMPH:  no palpable lymphadenopathy in the cervical, axillary or inguinal LUNGS: clear to auscultation and percussion with normal breathing effort  HEART: regular rate & rhythm and no murmurs and no lower extremity edema EXT: Right upper extremity with edema and  redness ABDOMEN:abdomen soft, non-tender and normal bowel sounds Musculoskeletal:no cyanosis of digits and no clubbing  NEURO: alert & oriented x 3 with fluent speech, no focal motor/sensory deficits   Labs:  Recent Labs    12/21/18 0820 12/30/18 0720 12/31/18 0442 01/01/19 0453 01/02/19 0500 01/03/19 0350  NA 142 133* 139 141 140 139  K 3.8 3.8 3.6 3.3* 3.4* 3.6  CL 105 102 113* 114* 113* 113*  CO2 23 21* 15* 16* 19* 21*  GLUCOSE 138* 193* 96 75 161* 182*  BUN 23 39* 37* 38* 27* 21  CREATININE 1.01* 1.53* 1.09* 1.08* 0.83 0.74  CALCIUM 9.0 8.2* 6.9* 7.6* 8.2* 8.0*  GFRNONAA 56* 34* 51* 52* >60 >60  GFRAA >60 39* 59* 60* >60 >60  PROT 7.7 6.8 6.1*  --   --   --   ALBUMIN 4.1 3.7 3.1*  --   --  2.5*  AST 26 43* 49*  --   --   --   ALT 33 40 36  --   --   --   ALKPHOS 133* 71 38  --   --   --   BILITOT 0.8 2.8* 1.5*  --   --   --     Studies:  Dg Lumbar Spine Complete  Result Date: 12/30/2018 CLINICAL DATA:  Recent fall with low back pain, initial encounter EXAM: LUMBAR SPINE - COMPLETE 4+ VIEW COMPARISON:  None. FINDINGS: Five lumbar type vertebral bodies are well visualized. Osteophytic changes are noted worst at L2-3 and L4-5. Facet hypertrophic changes are noted. No anterolisthesis is seen. No soft tissue abnormality is noted. IMPRESSION: Mild degenerative change without acute abnormality. Electronically Signed   By: Inez Catalina M.D.   On: 12/30/2018 08:51   Ct Head Wo Contrast  Result Date: 12/30/2018 CLINICAL DATA:  Follow-up subdural hemorrhage. EXAM: CT HEAD WITHOUT CONTRAST TECHNIQUE: Contiguous axial images were obtained from the base of the skull through the vertex without intravenous contrast. COMPARISON:  12/30/2018 FINDINGS: Brain: Small volume parafalcine subdural hematoma measures up to 3 mm in thickness, unchanged. Minimal subdural blood along the right tentorium is also unchanged. No convincing subarachnoid hemorrhage is identified within limitations of mild  motion artifact. No acute infarct, mass, or midline shift is evident. The ventricles and sulci are normal in size. Vascular: Calcified atherosclerosis at the skull base. Skull: No fracture or focal osseous lesion. Sinuses/Orbits: Mild, partially polypoid right sphenoid sinus mucosal thickening. Clear mastoid air cells. Bilateral cataract extraction. Other: Right-sided scalp skin staples. IMPRESSION: 1. Unchanged small parafalcine and right tentorial subdural hematomas. 2. No evidence of new intracranial abnormality. Electronically Signed   By: Logan Bores M.D.   On: 12/30/2018 18:10   Ct Head Wo Contrast  Result Date: 12/30/2018 CLINICAL DATA:  Pain following fall.  History of ovarian carcinoma EXAM: CT HEAD WITHOUT CONTRAST CT CERVICAL SPINE WITHOUT CONTRAST TECHNIQUE: Multidetector CT imaging of the head and cervical spine was performed following the standard protocol without intravenous contrast. Multiplanar CT image reconstructions of the cervical spine were also generated. COMPARISON:  None. FINDINGS: CT HEAD FINDINGS Brain: The ventricles are normal in size and configuration. There is a small amount of hemorrhage in the anterior to mid falx consistent with a small falcine subdural hematoma. There is no appreciable mass effect in this area. A small amount of subarachnoid hemorrhage is noted in the  left paramedian frontal region near the falcine subdural hematoma. There is equivocal small amount of right frontal subarachnoid hemorrhage as well, not causing mass effect. No other foci of hemorrhage identified. No midline shift. Beyond the mild hemorrhage within the falx, there is no other extra-axial fluid collection. No findings indicative of focal infarct. No intracranial edema evident. Vascular: No hyperdense vessel. There are foci of arterial vascular calcification in the carotid siphon regions. Skull: The bony calvarium appears intact. Sinuses/Orbits: Visualized paranasal sinuses are clear. There is  mild rightward deviation of the nasal septum. Orbits appear symmetric bilaterally. Other: Mastoid air cells are clear. CT CERVICAL SPINE FINDINGS Alignment: There is no appreciable spondylolisthesis. Skull base and vertebrae: Skull base and craniocervical junction regions appear normal. No fractures evident. Bones are somewhat osteoporotic. No blastic or lytic bone lesions. Soft tissues and spinal canal: Prevertebral soft tissues and predental space regions are normal. There is no evident cord or canal hematoma. No paraspinous lesions are demonstrable. Disc levels: There is moderate disc space narrowing at C5-6 and C6-7. There are anterior osteophytes at C5, C6, and C7. There is facet hypertrophy at multiple levels, most severe at C3-4 on the right. There is exit foraminal narrowing due to bony hypertrophy at C5-6 and C6-7 bilaterally. No frank disc extrusion or stenosis. Upper chest: Visualized upper lung regions are clear. Other: None IMPRESSION: Head CT: 1. Small amount of falcine subdural hematoma without appreciable mass effect. Thickness of the hemorrhage within this area measures 3 mm. No other extra-axial fluid. 2. Small amount of subarachnoid hemorrhage in the right frontal and paramedian left frontal lobe regions without mass effect or edema. 3.  Elsewhere brain parenchyma appears unremarkable. 4.  There are foci of arterial vascular calcification. CT cervical spine: 1.  No fracture or spondylolisthesis. 2. Osteoarthritic change at several levels. No disc extrusion or stenosis evident. 3.  No blastic or lytic bone lesions.  Bone somewhat osteoporotic. Critical Value/emergent results were called by telephone at the time of interpretation on 12/30/2018 at 8:27 am to providerDr. Tomi Bamberger, ED physician, who verbally acknowledged these results. Electronically Signed   By: Lowella Grip III M.D.   On: 12/30/2018 08:27   Ct Cervical Spine Wo Contrast  Result Date: 12/30/2018 CLINICAL DATA:  Pain following  fall.  History of ovarian carcinoma EXAM: CT HEAD WITHOUT CONTRAST CT CERVICAL SPINE WITHOUT CONTRAST TECHNIQUE: Multidetector CT imaging of the head and cervical spine was performed following the standard protocol without intravenous contrast. Multiplanar CT image reconstructions of the cervical spine were also generated. COMPARISON:  None. FINDINGS: CT HEAD FINDINGS Brain: The ventricles are normal in size and configuration. There is a small amount of hemorrhage in the anterior to mid falx consistent with a small falcine subdural hematoma. There is no appreciable mass effect in this area. A small amount of subarachnoid hemorrhage is noted in the left paramedian frontal region near the falcine subdural hematoma. There is equivocal small amount of right frontal subarachnoid hemorrhage as well, not causing mass effect. No other foci of hemorrhage identified. No midline shift. Beyond the mild hemorrhage within the falx, there is no other extra-axial fluid collection. No findings indicative of focal infarct. No intracranial edema evident. Vascular: No hyperdense vessel. There are foci of arterial vascular calcification in the carotid siphon regions. Skull: The bony calvarium appears intact. Sinuses/Orbits: Visualized paranasal sinuses are clear. There is mild rightward deviation of the nasal septum. Orbits appear symmetric bilaterally. Other: Mastoid air cells are clear. CT CERVICAL SPINE FINDINGS  Alignment: There is no appreciable spondylolisthesis. Skull base and vertebrae: Skull base and craniocervical junction regions appear normal. No fractures evident. Bones are somewhat osteoporotic. No blastic or lytic bone lesions. Soft tissues and spinal canal: Prevertebral soft tissues and predental space regions are normal. There is no evident cord or canal hematoma. No paraspinous lesions are demonstrable. Disc levels: There is moderate disc space narrowing at C5-6 and C6-7. There are anterior osteophytes at C5, C6, and  C7. There is facet hypertrophy at multiple levels, most severe at C3-4 on the right. There is exit foraminal narrowing due to bony hypertrophy at C5-6 and C6-7 bilaterally. No frank disc extrusion or stenosis. Upper chest: Visualized upper lung regions are clear. Other: None IMPRESSION: Head CT: 1. Small amount of falcine subdural hematoma without appreciable mass effect. Thickness of the hemorrhage within this area measures 3 mm. No other extra-axial fluid. 2. Small amount of subarachnoid hemorrhage in the right frontal and paramedian left frontal lobe regions without mass effect or edema. 3.  Elsewhere brain parenchyma appears unremarkable. 4.  There are foci of arterial vascular calcification. CT cervical spine: 1.  No fracture or spondylolisthesis. 2. Osteoarthritic change at several levels. No disc extrusion or stenosis evident. 3.  No blastic or lytic bone lesions.  Bone somewhat osteoporotic. Critical Value/emergent results were called by telephone at the time of interpretation on 12/30/2018 at 8:27 am to providerDr. Tomi Bamberger, ED physician, who verbally acknowledged these results. Electronically Signed   By: Lowella Grip III M.D.   On: 12/30/2018 08:27   Ct Abdomen Pelvis W Contrast  Addendum Date: 12/15/2018   ADDENDUM REPORT: 12/15/2018 12:23 ADDENDUM: The original report used examination from 04/08/2018 as comparison. The following is a comparison to the more recent previous exam from 09/15/2018. CLINICAL DATA:   Peritoneal carcinoma.  Restaging. EXAM: CT ABDOMEN AND PELVIS WITH CONTRAST TECHNIQUE: Multidetector CT imaging of the abdomen and pelvis was performed using the standard protocol following bolus administration of intravenous contrast. CONTRAST:   13m OMNIPAQUE IOHEXOL 300 MG/ML  SOLN COMPARISON: 09/15/2018 FINDINGS: Lower chest: Bibasilar linear scarring identified. Hepatobiliary: There is a diffusely nodular contour to the liver compatible with cirrhosis. No focal liver abnormality  identified. The gallbladder is unremarkable. Pancreas: Unremarkable. No pancreatic ductal dilatation or surrounding inflammatory changes. Spleen: The spleen is enlarged measuring 13.6 cm in length. Adrenals/Urinary Tract: Normal appearance of the adrenal glands. No kidney mass or hydronephrosis identified. The urinary bladder appears normal. Stomach/Bowel: The stomach is unremarkable. No small bowel wall thickening, inflammation or distension. Persistent wall thickening involving the cecum which is contiguous with adjacent peritoneal thickening and nodularity. Cannot rule out serosal involvement by tumor. No pathologic dilatation of the colon noted. Vascular/Lymphatic: Aortic atherosclerosis. No aneurysm. Left periaortic lymph node measures 1 cm, image 22/2. Previously 1.1 cm. Central small bowel mesenteric node measures 1 cm, image 54/2. Previously 1.0 cm. No enlarged pelvic or inguinal lymph nodes. Reproductive: Status post hysterectomy. Soft tissue nodule within the left pelvis contiguous with the left side of vaginal cuff measures 2.3 x 2.1 cm, image 75/2. Previously 2.7 x 2.3 cm. Other: There is mild bilateral peritoneal thickening. Similar to previous exam. Soft tissue nodule within the right posterior iliac fossa measures 0.8 cm, image 50/2. Previously this measured 0.9 cm. Musculoskeletal: No acute or significant osseous findings. Stable superior endplate deformity involving the L3 vertebra. IMPRESSION: 1. Mild improvement in peritoneal disease. Peritoneal nodule within the left posterior pelvis contiguous with the left side of vaginal cuff is slightly  decreased in size in the interval. Within the right iliac fossa there is a small peritoneal nodule which has decreased in size in the interval. Central small bowel mesenteric lymph node is not significantly changed. Slight decrease in size left periaortic lymph node. 2. Morphologic features of the liver compatible with cirrhosis. Splenomegaly is noted which  may reflect portal venous hypertension. Electronically Signed   By: Kerby Moors M.D.   On: 12/15/2018 12:23   Result Date: 12/15/2018 CLINICAL DATA:  Peritoneal carcinoma.  Restaging. EXAM: CT ABDOMEN AND PELVIS WITH CONTRAST TECHNIQUE: Multidetector CT imaging of the abdomen and pelvis was performed using the standard protocol following bolus administration of intravenous contrast. CONTRAST:  126m OMNIPAQUE IOHEXOL 300 MG/ML  SOLN COMPARISON:  04/08/2018 FINDINGS: Lower chest: Bibasilar linear scarring identified. Hepatobiliary: There is a diffusely nodular contour to the liver compatible with cirrhosis. No focal liver abnormality identified. The gallbladder is unremarkable. Pancreas: Unremarkable. No pancreatic ductal dilatation or surrounding inflammatory changes. Spleen: The spleen is enlarged measuring 13.6 cm in length. Adrenals/Urinary Tract: Normal appearance of the adrenal glands. No kidney mass or hydronephrosis identified. The urinary bladder appears normal. Stomach/Bowel: The stomach is unremarkable. No small bowel wall thickening, inflammation or distension. Persistent wall thickening involving the cecum which is contiguous with adjacent peritoneal thickening and nodularity. Cannot rule out serosal involvement by tumor. No pathologic dilatation of the colon noted. Vascular/Lymphatic: Aortic atherosclerosis. No aneurysm. Left periaortic lymph node measures 1 cm, image 22/2. Unchanged. Central small bowel mesenteric node measures 1 cm, image 54/2. Previously 0.8 cm. No enlarged pelvic or inguinal lymph nodes. Reproductive: Status post hysterectomy. Soft tissue nodule within the left pelvis contiguous with the left side of vaginal cuff measures 2.3 x 2.1 cm, image 75/2. Previously 1.9 x 1.6 cm. Other: There is mild bilateral peritoneal thickening. Similar to previous exam. Soft tissue nodule within the right posterior iliac fossa measures 0.8 cm, image 50/2. Previously this measured 0.4 cm.  Musculoskeletal: No acute or significant osseous findings. Stable superior endplate deformity involving the L3 vertebra. IMPRESSION: 1. Mild progression of disease. Peritoneal nodule within the left posterior pelvis contiguous with the left side of vaginal cuff is slightly increased in size in the interval. Within the right iliac fossa there is a small peritoneal nodule which has increased in size in the interval. Central small bowel mesenteric lymph node is mildly increased in size. Currently 1 cm. Previously 0.8 cm. Persistent mural thickening involving the cecum which appears contiguous with peritoneal thickening is concerning for serosal involvement by tumor. 2. Morphologic features of the liver compatible with cirrhosis. Splenomegaly is noted which may reflect portal venous hypertension. Electronically Signed: By: TKerby MoorsM.D. On: 12/14/2018 14:24   Dg Chest Port 1 View  Result Date: 12/30/2018 CLINICAL DATA:  Fever EXAM: PORTABLE CHEST 1 VIEW COMPARISON:  2015 FINDINGS: Right chest wall port catheter tip overlies the central SVC. Lungs are clear. No pleural effusion. Cardiomediastinal contours are within normal limits with normal heart size. IMPRESSION: No active disease. Electronically Signed   By: PMacy MisM.D.   On: 12/30/2018 07:27   Vas UKoreaUpper Extremity Venous Duplex  Result Date: 12/31/2018 UPPER VENOUS STUDY  Indications: Erythema, Edema, and Pain status post IV Vancomycin Limitations: Interstitial edema and pain with touch. Comparison Study: No prior study on file for comparison Performing Technologist: CSharion DoveRVS  Examination Guidelines: A complete evaluation includes B-mode imaging, spectral Doppler, color Doppler, and power Doppler as needed of all accessible portions of each  vessel. Bilateral testing is considered an integral part of a complete examination. Limited examinations for reoccurring indications may be performed as noted.  Right Findings:  +----------+------------+---------+-----------+----------+---------------+ RIGHT     CompressiblePhasicitySpontaneousProperties    Summary     +----------+------------+---------+-----------+----------+---------------+ IJV           Full       Yes       Yes                              +----------+------------+---------+-----------+----------+---------------+ Subclavian    Full       Yes       Yes                              +----------+------------+---------+-----------+----------+---------------+ Axillary      Full       Yes       Yes                              +----------+------------+---------+-----------+----------+---------------+ Brachial      Full       Yes       Yes                              +----------+------------+---------+-----------+----------+---------------+ Radial                                              patent by color +----------+------------+---------+-----------+----------+---------------+ Ulnar                                               patent by color +----------+------------+---------+-----------+----------+---------------+ Cephalic      Full                                                  +----------+------------+---------+-----------+----------+---------------+ Basilic       Full                                                  +----------+------------+---------+-----------+----------+---------------+  Left Findings: +----------+------------+---------+-----------+----------+-------+ LEFT      CompressiblePhasicitySpontaneousPropertiesSummary +----------+------------+---------+-----------+----------+-------+ Subclavian               Yes       Yes                      +----------+------------+---------+-----------+----------+-------+  Summary:  Right: No evidence of deep vein thrombosis in the upper extremity. No evidence of superficial vein thrombosis in the upper extremity.  Left: No evidence of  thrombosis in the subclavian.  *See table(s) above for measurements and observations.  Diagnosing physician: Monica Martinez MD Electronically signed by Monica Martinez MD on 12/31/2018 at 7:11:02 PM.    Final

## 2019-01-04 NOTE — TOC Transition Note (Signed)
Transition of Care [facility]) - CM/SW Discharge Note   Patient Details  Name: Olivia Benson MRN: [id number] Date of Birth: 11-Aug-1945  [facility] CM/SW Contact:  Trish Mage, LCSW Phone Number: 01/04/2019, 12:51 PM   Clinical Narrative:   Patient seen in follow up to PT recommendation of [facility].  [name] lives at home with her husband, has DME of rolling walker, 3 in 1, shower chair, grab handles in shower.  She states she feels like her fall was an isolated incident, she will be doing some things differently at home as a result, she and her husband have talked about it and they are declining [facility] services. TOC sign off.    Final next level of care: Home/Self Care Barriers to Discharge: No Barriers Identified   Patient Goals and CMS Choice        Discharge Placement                       Discharge Plan and Services                                     Social Determinants of Health (SDOH) Interventions     Readmission Risk Interventions Readmission Risk Prevention Plan 12/31/2018  Transportation Screening Complete  Medication Review (RN Care Manager) Referral to Pharmacy  PCP or Specialist appointment within 3-5 days of discharge Not Complete  PCP/Specialist Appt Not Complete comments DC date unknown but established with pcp and cancer center  [place] or [place] Not Complete  HRI or Home Care Consult Pt Refusal Comments pending need  SW Recovery Care/Counseling Consult Not Complete  SW Consult Not Complete Comments pending need  Palliative Care Screening Not Complete  Comments pending need  [place] Not Complete  SNF Comments pending need  Some recent data might be hidden

## 2019-01-04 NOTE — Care Management Important Message (Signed)
Important Message  Patient Details IM Letter given to [facility] SW to present to the Patient Name: Olivia Benson MRN: [id number] Date of Birth: [birth year]   Medicare Important Message Given:  Yes     Kerin Salen 01/04/2019, 10:53 AM

## 2019-01-04 NOTE — Progress Notes (Signed)
Pt dc'd to home with spouse.  Stable condtion and no c/o at time of dc.

## 2019-01-04 NOTE — Progress Notes (Signed)
Physical Therapy Treatment Patient Details Name: Olivia Benson MRN: [id number] DOB: 06/15/1945 Today's Date: 01/04/2019    History of Present Illness "73 y.o. 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 She's found to have pancytopenia due to chemotherapy; severe severe sepsis (likely due to chemo induced mucositis) with neutropenic fever (started on broad coverage antibiotics pending cultures), lactic acidosis, hypotension requiring 6L NS bolus and pressor;  subdural hematoma and SAH, for which neurosurgery consulted, received platelet transfusion, FFP and vitamin K, stable SDH/SAH per repeated CTH"    PT Comments    Pt agreeable to ambulate to/from bathroom.  Pt's mobility much improved since last session (Friday).  Pt and spouse reports pt has open floor house and all DME needed for d/c.  Pt and spouse believe pt's fall (prior to admission) from symptoms of chemotherapy and plan to be more cautious with next round.    Follow Up Recommendations  Home health PT;Supervision for mobility/OOB     Equipment Recommendations  None recommended by PT    Recommendations for Other Services       Precautions / Restrictions Precautions Precautions: Fall Precaution Comments: R UE painful/tender    Mobility  Bed Mobility Overal bed mobility: Needs Assistance Bed Mobility: Supine to Sit;Sit to Supine     Supine to sit: Supervision;HOB elevated Sit to supine: Supervision;HOB elevated   General bed mobility comments: supervision for safety, no physical assist required  Transfers Overall transfer level: Needs assistance Equipment used: Rolling walker (2 wheeled) Transfers: Sit to/from Stand Sit to Stand: Min guard         General transfer  comment: verbal cues for hand placement, min/guard for safety, pt reports taking her time due to peripheral neuropathy so she likes to make sure her "feet are under her"  Ambulation/Gait Ambulation/Gait assistance: Min guard Gait Distance (Feet): 8 Feet(x2) Assistive device: Rolling walker (2 wheeled) Gait Pattern/deviations: Step-through pattern;Decreased stride length     General Gait Details: min/guard for safety however pt steady with RW   Stairs             Wheelchair Mobility    Modified Rankin (Stroke Patients Only)       Balance                                            Cognition Arousal/Alertness: Awake/alert Behavior During Therapy: WFL for tasks assessed/performed Overall Cognitive Status: Within Functional Limits for tasks assessed                                        Exercises      General Comments        Pertinent Vitals/Pain Pain Assessment: 0-10 Pain Score: 4  Pain Location: Rt UE and Rt hip  Pain Descriptors / Indicators: Aching;Grimacing;Guarding;Radiating;Sharp Pain Intervention(s): Monitored during session;Repositioned    Home Living                      Prior Function            PT Goals (current goals can now be found in  the care plan section) Progress towards PT goals: Progressing toward goals    Frequency    Min 3X/week      PT Plan Current plan remains appropriate    Co-evaluation              AM-PAC PT "6 Clicks" Mobility   Outcome Measure  Help needed turning from your back to your side while in a flat bed without using bedrails?: A Little Help needed moving from lying on your back to sitting on the side of a flat bed without using bedrails?: A Little Help needed moving to and from a bed to a chair (including a wheelchair)?: A Little Help needed standing up from a chair using your arms (e.g., wheelchair or bedside chair)?: A Little Help needed to walk in  hospital room?: A Little Help needed climbing 3-5 steps with a railing? : A Lot 6 Click Score: 17    End of Session Equipment Utilized During Treatment: (declined gait belt) Activity Tolerance: Patient tolerated treatment well Patient left: in bed;with call bell/phone within reach;with bed alarm set;with family/visitor present   PT Visit Diagnosis: Difficulty in walking, not elsewhere classified (R26.2)     Time: 5868-2574 PT Time Calculation (min) (ACUTE ONLY): 22 min  Charges:  $Gait Training: 8-22 mins                    Olivia Benson, PT, DPT Acute Rehabilitation Services Office: [phone] Pager: [phone]  Olivia Benson 01/04/2019, 12:02 PM

## 2019-01-04 NOTE — Progress Notes (Signed)
Occupational Therapy Treatment Patient Details Name: Olivia Benson MRN: [id number] DOB: 07-Mar-1945 Today's Date: 01/04/2019    History of present illness "73 y.o. 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 She's found to have pancytopenia due to chemotherapy; severe severe sepsis (likely due to chemo induced mucositis) with neutropenic fever (started on broad coverage antibiotics pending cultures), lactic acidosis, hypotension requiring 6L NS bolus and pressor;  subdural hematoma and SAH, for which neurosurgery consulted, received platelet transfusion, FFP and vitamin K, stable SDH/SAH per repeated CTH"   OT comments  OT session focused on edema control RUE.  Pt demonstrated and verbalized understanding  Follow Up Recommendations  Home health OT;Supervision/Assistance - 24 hour          Precautions / Restrictions Precautions Precautions: Fall Precaution Comments: R UE painful/tender       Mobility Bed Mobility Overal bed mobility: Needs Assistance Bed Mobility: Supine to Sit;Sit to Supine     Supine to sit: Supervision;HOB elevated Sit to supine: Supervision;HOB elevated   General bed mobility comments: supervision for safety, no physical assist required  Transfers Overall transfer level: Needs assistance Equipment used: Rolling walker (2 wheeled) Transfers: Sit to/from Stand Sit to Stand: Min guard         General transfer comment: verbal cues for hand placement, min/guard for safety, pt reports taking her time due to peripheral neuropathy so she likes to make sure her "feet are under her"        ADL either performed or assessed with clinical judgement   ADL Overall ADL's : Needs assistance/impaired Eating/Feeding: Minimal  assistance                                     General ADL Comments: pt in bed and lunch just arrived.  Educated pt to keep RUE elevated as well as AARO to decrease edema.  Pts RUE with noted edema and redness.  Educated on importance of elevation as well as keeping an eye on increased redness.     Vision Baseline Vision/History: Wears glasses Wears Glasses: At all times Patient Visual Report: No change from baseline            Cognition Arousal/Alertness: Awake/alert Behavior During Therapy: WFL for tasks assessed/performed Overall Cognitive Status: Within Functional Limits for tasks assessed                                                     Pertinent Vitals/ Pain       Pain Assessment: 0-10 Pain Score: 3  Pain Location: Rt UE Pain Descriptors / Indicators: Aching;Grimacing;Guarding;Radiating;Sharp Pain Intervention(s): Monitored during session     Prior Functioning/Environment              Frequency  Min 2X/week        Progress Toward Goals  OT Goals(current goals can now be found in the care plan section)  Progress towards OT goals: Progressing toward goals         AM-PAC OT "6 Clicks" Daily Activity     Outcome Measure   Help  from another person eating meals?: A Little Help from another person taking care of personal grooming?: A Little Help from another person toileting, which includes using toliet, bedpan, or urinal?: A Lot Help from another person bathing (including washing, rinsing, drying)?: A Lot Help from another person to put on and taking off regular upper body clothing?: A Lot Help from another person to put on and taking off regular lower body clothing?: Total 6 Click Score: 13    End of Session    OT Visit Diagnosis: Unsteadiness on feet (R26.81);Pain Pain - Right/Left: Right Pain - part of body: Arm   Activity Tolerance Patient tolerated treatment well   Patient Left in bed;with call  bell/phone within reach   Nurse Communication Mobility status        Time: 1255-1315 OT Time Calculation (min): 20 min  Charges: OT General Charges $OT Visit: 1 Visit OT Treatments $Therapeutic Activity: 8-22 mins  Kari Baars, OT Acute Rehabilitation Services [phone] Office- [phone]      Marionna Gonia, Edwena Felty D 01/04/2019, 2:41 PM

## 2019-01-04 NOTE — Discharge Summary (Addendum)
Physician Discharge Summary  Olivia Benson OZD:[id number] DOB: October 13, 1945 DOA: 12/30/2018  PCP: Gregor Hams, FNP  Admit date: 12/30/2018 Discharge date: 01/04/2019 Consultations: Hematology/Oncology Dr Alvy Bimler Admitted From: home Disposition: home  Discharge Diagnoses:  Principal Problem:   Subdural hematoma ([place]) Active Problems:   Neutropenic fever ([place])   SAH (subarachnoid hemorrhage) ([place])   Severe sepsis with septic shock (HCC)   Liver cirrhosis secondary to NASH ([place])   Fall, scalp laceration   Antineoplastic chemotherapy induced pancytopenia ([place])   Phlebitis after infusion   Acute lower UTI   Essential hypertension   Diabetes mellitus with diabetic neuropathy, with long-term current use of insulin (HCC)   Chronic diastolic CHF (congestive heart failure) (HCC)   Mucositis due to antineoplastic therapy   AKI (acute kidney injury) ([place])   Coagulopathy ([place])   Peritoneal carcinomatosis [facility])  Hospital Course Summary: 73 y.o.femalewith HTN, CAD, ovarian cancer-peritoneal carcinomatosiscurrently on chemotherapy, livercirrhosis with portal hypertension, diabetes mellitusandasthma admitted on 11/18 after a fall due to syncope episode. On presentation, patient washypotensiverequiring 6L NS bolus and neosynephrine. Imaging studies done following the fall revealed subdural hematoma and SAH, for which neurosurgerywasconsulted and conservative management recommended.Patient during the hospital course received platelet transfusion, FFP and vitamin K. Repeat CT Head revealedstable SDH/SAH. Mild AKI was noted thatresolved with IVF.  Oncology, Dr Alvy Bimler, was consulted for evaluation of pancytopeniawith neutropenic fever in the setting of chemotherapy. Concerns for chemotherapy induced mucositis was also raised. Patient was started on broad spectrum antibiotics-IV vancomycin/cefepime. Blood cultures show no growth. Urine culture grew enterococcus faecalis and MRSA.  Patient is currently on oral Levaquin and Macrobid (since 11/20). Hospital course complicated by diarrhea-now resolved, with negative C Diff and GI panel. Venous doppler RUE obtained for edema/pain at IV site after vanco infusion, negative for thrombosis but likely phlebitis, improving with antibiotics and heat compresses.    Neutropenic fever/Sepsis syndrome with septic shock: POA.  Patient presented with fever/hypotension, T-max 102F, with labs showing elevated lactate and leukopenia/severe neutropenia with acute kidney injury.  Chest x-ray/COVID-19 negative.  Patient initially responded to 3 L of IV fluids in the ED.  However on the night of 11/18 patient had recurrent hypotension and required another 3 L of fluids as well as albumin infusion/pressors briefly.  UA abnormal and urine culture did grow Enterococcus faecalis and MRSA as discussed above.  Patient seen by hematology/oncology who felt oral mucosa cytosis could be contributing to her presentation.  Patient admitted with broad-spectrum antibiotics transition to oral Levaquin and Macrobid-recommended to complete 5-day course.  She has also been started on Magic mouthwash to help with chemotherapy-induced oral mucositis and it appears to be helping.  Patient's blood pressure now improved and elevated.  Syncope/fall resulting in head trauma-scalp laceration/subdural hematoma/subarachnoid hemorrhage: Syncope could have been related to hypotension in the setting of sepsis.  Seen by neurosurgery for SDH/SAH on admission and recommended conservative management.  She did have thrombocytopenia and mild coagulopathy with INR around 1.5 which could have reduced her threshold for intracranial bleeding.  She did receive 4 units of FFP and platelet transfusions on admission.  Patient fortunately has not had any confusion and is awake alert oriented x3 with no complaints of headache at this time. Patient had staples (5) placed by ED physician on 11/18 , should  f/u with PCP for removal in 10 days.  Acute kidney injury: On presentation patient's lab work revealed creatinine elevation at 1.53 and BUN at 39.  Likely related to hypoperfusion in the setting  of sepsis.  It quickly improved and resolved with IV hydration.  Right upper arm phlebitis: Following IV vancomycin infusion/extravasation.  Doppler right upper extremity negative for DVT.  Continue conservative measures including heat compresses and empiric antibiotics.  Patient advised to follow-up with PCP/primary oncology if symptoms worsen at which time she may need repeat Doppler study.  Recurrent primary peritoneal carcinomatosis: Seen by oncology and appreciate their input.  They recommend delaying chemotherapy by 1 week to allow recovery.  Patient has a Port-A-Cath.  Blood cultures have been negative and there have been no signs of infection around the Port-A-Cath.  Chemotherapy-induced pancytopenia:Received G-CSF support to try to get ANC to greater than 1.5 Granix has been discontinued; ANC is 4.1 today. 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 Her baseline platelet count is around 80,000.  Hematology recommends keeping her platelet count 50,000 and higher  Nonalcoholic liver cirrhosis with splenomegaly, portal hypertension, chronic thrombocytopenia and mild coagulopathy: No evidence of ascites currently.Received 4 units of fresh frozen plasma on 12/31/2018.  Hematology recommends to keep INR less than 1.5, last check showed INR 1.2 on 11/22.  Antibiotic induced diarrhea: Now resolved.  Stool C. difficile and GI panel negative.  Essential hypertension: Patient takes sotalol, HCTZ and valsartan at home.  ACE inhibitors and diuretics were held on admission and concern for AKI.  Sotalol was also held due to hypotension.  Currently blood pressure elevated to systolic 893Y and heart rate in the 80s.  Can resume sotalol and advise follow-up with PCP  before resuming HCTZ/valsartan.  Orthostatic blood pressures were checked prior to discharge given syncope on presentation.  She did not have any orthostatic change in her systolic blood pressure went up on sitting and standing at systolic 101B to 510C (from supine systolic at 585I)  Mild intermittent asthma: Stable.  No acute issues.  Resume home inhaler therapy.  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    Resume home insulin regimen at discharge.   Managed with sliding scale insulin, diabetic diet while in hospital  Debility/deconditioning/PCM: likely secondary to volume depletion, disseminated cancer and ongoing chemotherapy.  Albumin level low at 3.0, albeit in the setting of cancer and liver cirrhosis. She does have neuropathy as well.    Patient evaluated by PT/OT and recommended SNF rehab but patient declined.  She states she has 24/7 supervision and her husband is very supportive.  She states she already has DME and has declined any home health referrals.  States daughter is an ICU nurse and she has more than enough help.   Discharge Exam:  Vitals:   01/03/19 2045 01/04/19 0441  BP: (!) 156/64 (!) 162/69  Pulse: 87 81  Resp: 18 18  Temp: 99.3 F (37.4 C) 97.9 F (36.6 C)  SpO2: 95% 97%   Vitals:   01/03/19 0644 01/03/19 1541 01/03/19 2045 01/04/19 0441  BP: (!) 130/57 (!) 160/67 (!) 156/64 (!) 162/69  Pulse: 73 78 87 81  Resp: 18 15 18 18   Temp: 98.3 F (36.8 C) 98.2 F (36.8 C) 99.3 F (37.4 C) 97.9 F (36.6 C)  TempSrc: Oral Oral Oral Oral  SpO2: 97% 100% 95% 97%  Weight:    93.7 kg  Height:        General: Pt is alert, awake, not in acute distress Cardiovascular: RRR, S1/S2 +, no rubs, no gallops Respiratory: CTA bilaterally, no wheezing, no rhonchi Abdominal: Soft, NT, ND, bowel sounds + Extremities: Redness, cordlike tenderness along  right antecubital area extending somewhat to the right arm-improving per patient  Discharge Condition:Stable CODE  STATUS: Full code Diet recommendation: Heart healthy, carb modified Recommendations for Outpatient Follow-up:  1. Follow up with PCP: 1 week 2. Follow up with consultants: Hematology/oncology as scheduled 3. Please obtain follow up labs including: CBC, BMP  Home Health services upon discharge: Patient declined Equipment/Devices upon discharge: Already has DME at home   Discharge Instructions:  Discharge Instructions    Call MD for:   Complete by: As directed    Worsening arm redness/swelling   Call MD for:  difficulty breathing, headache or visual disturbances   Complete by: As directed    Call MD for:  extreme fatigue   Complete by: As directed    Call MD for:  persistant nausea and vomiting   Complete by: As directed    Call MD for:  redness, tenderness, or signs of infection (pain, swelling, redness, odor or green/yellow discharge around incision site)   Complete by: As directed    Call MD for:  severe uncontrolled pain   Complete by: As directed    Call MD for:  temperature >100.4   Complete by: As directed    Diet - low sodium heart healthy   Complete by: As directed    Carb modified diabetic diet   Increase activity slowly   Complete by: As directed      Allergies as of 01/04/2019      Reactions   Carboplatin Anaphylaxis   Shellfish Allergy Anaphylaxis   HER THROAT SWELLS UP AND CLOSES   Vancomycin Rash   Pt developed a large red rash covering her entire arm after infusion. Suspected red-man syndrome vs infiltration   Benadryl [diphenhydramine Hcl] Other (See Comments)   Restless legs   Penicillins Rash   DID THE REACTION INVOLVE: Swelling of the face/tongue/throat, SOB, or low BP? No Sudden or severe rash/hives, skin peeling, or the inside of the mouth or nose? No Did it require medical treatment? No When did it last happen?Childhood allergy If all above answers are "NO", may proceed with cephalosporin use.   Tape Itching   Red itch with some tapes       Medication List    STOP taking these medications   hydrochlorothiazide 12.5 MG capsule Commonly known as: MICROZIDE   valsartan 40 MG tablet Commonly known as: DIOVAN     TAKE these medications   cholecalciferol 1000 units tablet Commonly known as: VITAMIN D Take 1,000 Units by mouth at bedtime.   dexamethasone 4 MG tablet Commonly known as: DECADRON Start the day before Taxotere, take 1 tablet in the morning, and then 1 tablet daily after chemo for 2 days. Pls dispense 30 tabs   empagliflozin 10 MG Tabs tablet Commonly known as: JARDIANCE Take 10 mg by mouth daily.   gabapentin 300 MG capsule Commonly known as: NEURONTIN TAKE 2 CAPSULES(600 MG) BY MOUTH TWICE DAILY What changed: See the new instructions.   HYDROcodone-acetaminophen 5-325 MG tablet Commonly known as: NORCO/VICODIN Take 1 tablet by mouth every 6 (six) hours as needed for moderate pain.   levofloxacin 500 MG tablet Commonly known as: LEVAQUIN Take 1 tablet (500 mg total) by mouth at bedtime for 2 days.   lidocaine-prilocaine cream Commonly known as: EMLA Apply to Porta-cath  1-2 hours prior to access as directed. What changed:   how much to take  how to take this  when to take this  reasons to take this  additional  instructions   magic mouthwash w/lidocaine Soln Take 5 mLs by mouth 4 (four) times daily as needed for up to 8 days for mouth pain.   nadolol 20 MG tablet Commonly known as: Corgard Take 1 tablet (20 mg total) by mouth daily. What changed: when to take this   nitrofurantoin (macrocrystal-monohydrate) 100 MG capsule Commonly known as: MACROBID Take 1 capsule (100 mg total) by mouth every 12 (twelve) hours for 2 days.   ondansetron 8 MG tablet Commonly known as: Zofran Take 1 tablet (8 mg total) by mouth every 8 (eight) hours as needed. Start on the third day after chemotherapy.   pantoprazole 40 MG tablet Commonly known as: PROTONIX Take 1 tablet (40 mg total) by mouth  daily.   prochlorperazine 10 MG tablet Commonly known as: COMPAZINE Take 1 tablet (10 mg total) by mouth every 6 (six) hours as needed (Nausea or vomiting).   rotigotine 4 MG/24HR Commonly known as: Neupro APPLY 1 PATCH EXTERNALLY TO THE SKIN DAILY What changed:   how much to take  how to take this  when to take this  additional instructions   Tresiba FlexTouch 100 UNIT/ML Sopn FlexTouch Pen Generic drug: insulin degludec Inject 36 Units into the skin daily.       Allergies  Allergen Reactions  . Carboplatin Anaphylaxis  . Shellfish Allergy Anaphylaxis    HER THROAT SWELLS UP AND CLOSES  . Vancomycin Rash    Pt developed a large red rash covering her entire arm after infusion. Suspected red-man syndrome vs infiltration  . Benadryl [Diphenhydramine Hcl] Other (See Comments)    Restless legs   . Penicillins Rash    DID THE REACTION INVOLVE: Swelling of the face/tongue/throat, SOB, or low BP? No Sudden or severe rash/hives, skin peeling, or the inside of the mouth or nose? No Did it require medical treatment? No When did it last happen?Childhood allergy If all above answers are "NO", may proceed with cephalosporin use.   . Tape Itching    Red itch with some tapes      The results of significant diagnostics from this hospitalization (including imaging, microbiology, ancillary and laboratory) are listed below for reference.    Labs: BNP (last 3 results) No results for input(s): BNP in the last 8760 hours. Basic Metabolic Panel: Recent Labs  Lab 12/30/18 0720 12/31/18 0442 01/01/19 0453 01/02/19 0500 01/02/19 0540 01/03/19 0350  NA 133* 139 141 140  --  139  K 3.8 3.6 3.3* 3.4*  --  3.6  CL 102 113* 114* 113*  --  113*  CO2 21* 15* 16* 19*  --  21*  GLUCOSE 193* 96 75 161*  --  182*  BUN 39* 37* 38* 27*  --  21  CREATININE 1.53* 1.09* 1.08* 0.83  --  0.74  CALCIUM 8.2* 6.9* 7.6* 8.2*  --  8.0*  MG  --   --   --   --  1.4* 1.3*  PHOS  --   --    --   --   --  1.4*   Liver Function Tests: Recent Labs  Lab 12/30/18 0720 12/31/18 0442 01/03/19 0350  AST 43* 49*  --   ALT 40 36  --   ALKPHOS 71 38  --   BILITOT 2.8* 1.5*  --   PROT 6.8 6.1*  --   ALBUMIN 3.7 3.1* 2.5*   No results for input(s): LIPASE, AMYLASE in the last 168 hours. No results for input(s): AMMONIA in  the last 168 hours. CBC: Recent Labs  Lab 12/31/18 0442 01/01/19 0453 01/02/19 0500 01/03/19 0350 01/04/19 0421  WBC 0.7* 2.8* 5.2 5.4 5.6  NEUTROABS 0.2* 2.0 4.0 4.0 4.1  HGB 9.9* 8.5* 8.5* 8.1* 8.4*  HCT 30.8* 27.0* 26.8* 26.1* 27.1*  MCV 98.4 98.9 97.8 98.5 98.5  PLT 50* 53* 66* 72* 84*   Cardiac Enzymes: Recent Labs  Lab 12/30/18 0720  CKTOTAL 159   BNP: Invalid input(s): POCBNP CBG: Recent Labs  Lab 01/03/19 1119 01/03/19 1629 01/03/19 2049 01/04/19 0745 01/04/19 1206  GLUCAP 168* 161* 209* 184* 175*   D-Dimer No results for input(s): DDIMER in the last 72 hours. Hgb A1c No results for input(s): HGBA1C in the last 72 hours. Lipid Profile No results for input(s): CHOL, HDL, LDLCALC, TRIG, CHOLHDL, LDLDIRECT in the last 72 hours. Thyroid function studies No results for input(s): TSH, T4TOTAL, T3FREE, THYROIDAB in the last 72 hours.  Invalid input(s): FREET3 Anemia work up No results for input(s): VITAMINB12, FOLATE, FERRITIN, TIBC, IRON, RETICCTPCT in the last 72 hours. Urinalysis    Component Value Date/Time   COLORURINE YELLOW 01/02/2019 2029   APPEARANCEUR CLEAR 01/02/2019 2029   LABSPEC 1.017 01/02/2019 2029   PHURINE 5.0 01/02/2019 2029   GLUCOSEU 50 (A) 01/02/2019 2029   HGBUR SMALL (A) 01/02/2019 2029   BILIRUBINUR NEGATIVE 01/02/2019 2029   [place] NEGATIVE 01/02/2019 2029   PROTEINUR 30 (A) 01/02/2019 2029   UROBILINOGEN 0.2 03/22/2014 0917   NITRITE NEGATIVE 01/02/2019 2029   LEUKOCYTESUR NEGATIVE 01/02/2019 2029   Sepsis Labs Invalid input(s): PROCALCITONIN,  WBC,  LACTICIDVEN Microbiology Recent  Results (from the past 240 hour(s))  Blood Culture (routine x 2)     Status: None   Collection Time: 12/30/18  7:20 AM   Specimen: BLOOD  Result Value Ref Range Status   Specimen Description   Final    BLOOD PORTA CATH Performed at [facility], [place] [address] [place], [place] [address]    Special Requests   Final    BOTTLES DRAWN AEROBIC AND ANAEROBIC Blood Culture results may not be optimal due to an inadequate volume of blood received in culture bottles Performed at [place] [address], [place], [place] [address]    Culture   Final    NO GROWTH 5 DAYS Performed at [facility], [place] [address], [place], [place] [address]    Report Status 01/04/2019 FINAL  Final  Blood Culture (routine x 2)     Status: None   Collection Time: 12/30/18  7:20 AM   Specimen: BLOOD  Result Value Ref Range Status   Specimen Description   Final    BLOOD RIGHT ANTECUBITAL Performed at [place] [address] [place], [place] [address]    Special Requests   Final    BOTTLES DRAWN AEROBIC AND ANAEROBIC Blood Culture adequate volume Performed at [place] [address] [place], [place] [address]    Culture   Final    NO GROWTH 5 DAYS Performed at [facility], [place] [address] [place], [place] [address]    Report Status 01/04/2019 FINAL  Final  Urine culture     Status: Abnormal   Collection Time: 12/30/18  9:17 AM   Specimen: In/Out Cath Urine  Result Value Ref Range Status   Specimen Description   Final    IN/OUT CATH URINE Performed at [place] [facility] [address]  [place], [place] [address]    Special Requests   Final    NONE Performed at [facility], [place] [address] [place], [place] [address]    Culture (A)  Final    90,000 COLONIES/mL ENTEROCOCCUS FAECALIS 80,000 COLONIES/mL METHICILLIN RESISTANT STAPHYLOCOCCUS AUREUS    Report Status  01/01/2019 FINAL  Final   Organism ID, Bacteria ENTEROCOCCUS FAECALIS (A)  Final   Organism ID, Bacteria METHICILLIN RESISTANT STAPHYLOCOCCUS AUREUS (A)  Final      Susceptibility   Enterococcus faecalis - MIC*    AMPICILLIN <=2 SENSITIVE Sensitive     LEVOFLOXACIN 0.25 SENSITIVE Sensitive     NITROFURANTOIN <=16 SENSITIVE Sensitive     VANCOMYCIN 1 SENSITIVE Sensitive     * 90,000 COLONIES/mL ENTEROCOCCUS FAECALIS   Methicillin resistant staphylococcus aureus - MIC*    CIPROFLOXACIN 1 SENSITIVE Sensitive     GENTAMICIN <=0.5 SENSITIVE Sensitive     NITROFURANTOIN <=16 SENSITIVE Sensitive     OXACILLIN >=4 RESISTANT Resistant     TETRACYCLINE >=16 RESISTANT Resistant     VANCOMYCIN 1 SENSITIVE Sensitive     TRIMETH/SULFA <=10 SENSITIVE Sensitive     CLINDAMYCIN >=8 RESISTANT Resistant     RIFAMPIN >=32 RESISTANT Resistant     Inducible Clindamycin NEGATIVE Sensitive     * 80,000 COLONIES/mL METHICILLIN RESISTANT STAPHYLOCOCCUS AUREUS  SARS CORONAVIRUS 2 (TAT 6-24 HRS) Nasopharyngeal Nasopharyngeal Swab     Status: None   Collection Time: 12/30/18  9:21 AM   Specimen: Nasopharyngeal Swab  Result Value Ref Range Status   SARS Coronavirus 2 NEGATIVE NEGATIVE Final    Comment: (NOTE) SARS-CoV-2 target nucleic acids are NOT DETECTED. The SARS-CoV-2 RNA is generally detectable in upper and lower respiratory specimens during the acute phase of infection. Negative results do not preclude SARS-CoV-2 infection, do not rule out co-infections with other pathogens, and should not be used as the sole basis for treatment or other patient management decisions. Negative results must be combined with clinical observations, patient history, and epidemiological information. The expected result is Negative. Fact Sheet for Patients: SugarRoll.be Fact Sheet for Healthcare Providers: https://www.woods-mathews.com/ This test is not yet approved or cleared by  the [place] FDA and  has been authorized for detection and/or diagnosis of SARS-CoV-2 by FDA under an Emergency Use Authorization (EUA). This EUA will remain  in effect (meaning this test can be used) for the duration of the COVID-19 declaration under Section 56 4(b)(1) of the Act, 21 U.S.C. section 360bbb-3(b)(1), unless the authorization is terminated or revoked sooner. Performed at [facility], [place] [address], [place], [place] [address]   MRSA PCR Screening     Status: None   Collection Time: 12/30/18 12:19 PM   Specimen: Nasal Mucosa; Nasopharyngeal  Result Value Ref Range Status   MRSA by PCR NEGATIVE NEGATIVE Final    Comment:        The GeneXpert MRSA Assay (FDA approved for NASAL specimens only), is one component of a comprehensive MRSA colonization surveillance program. It is not intended to diagnose MRSA infection nor to guide or monitor treatment for MRSA infections. Performed at [facility], [place] [address], [place], [place] [address]   C difficile quick scan w PCR reflex     Status: None   Collection Time: 12/31/18  7:52 AM   Specimen: Stool  Result Value Ref Range Status   C Diff antigen NEGATIVE NEGATIVE Final   C Diff toxin NEGATIVE NEGATIVE Final   C Diff  interpretation No C. difficile detected.  Final    Comment: Performed at [facility], [place] [address] [place], [place] [address]  Gastrointestinal Panel by PCR , Stool     Status: None   Collection Time: 12/31/18  7:52 AM   Specimen: Stool  Result Value Ref Range Status   Campylobacter species NOT DETECTED NOT DETECTED Final   Plesimonas shigelloides NOT DETECTED NOT DETECTED Final   Salmonella species NOT DETECTED NOT DETECTED Final   Yersinia enterocolitica NOT DETECTED NOT DETECTED Final   Vibrio species NOT DETECTED NOT DETECTED Final   Vibrio cholerae NOT DETECTED NOT DETECTED Final   Enteroaggregative E coli (EAEC) NOT DETECTED NOT DETECTED Final    Enteropathogenic E coli (EPEC) NOT DETECTED NOT DETECTED Final   Enterotoxigenic E coli (ETEC) NOT DETECTED NOT DETECTED Final   Shiga like toxin producing E coli (STEC) NOT DETECTED NOT DETECTED Final   Shigella/Enteroinvasive E coli (EIEC) NOT DETECTED NOT DETECTED Final   Cryptosporidium NOT DETECTED NOT DETECTED Final   Cyclospora cayetanensis NOT DETECTED NOT DETECTED Final   Entamoeba histolytica NOT DETECTED NOT DETECTED Final   Giardia lamblia NOT DETECTED NOT DETECTED Final   Adenovirus F40/41 NOT DETECTED NOT DETECTED Final   Astrovirus NOT DETECTED NOT DETECTED Final   Norovirus GI/GII NOT DETECTED NOT DETECTED Final   Rotavirus A NOT DETECTED NOT DETECTED Final   Sapovirus (I, II, IV, and V) NOT DETECTED NOT DETECTED Final    Comment: Performed at [facility], [place] [place], [place] [address]  Culture, Urine     Status: None   Collection Time: 01/02/19  8:29 PM   Specimen: Urine, Clean Catch  Result Value Ref Range Status   Specimen Description   Final    URINE, CLEAN CATCH Performed at [facility], [place] [address] [place], [place] [address]    Special Requests   Final    NONE Performed at [facility], [place] [address], [place], [place] [address]    Culture   Final    NO GROWTH Performed at [facility], [place] [address] [place], [place] [address]    Report Status 01/04/2019 FINAL  Final    Procedures/Studies: Dg Lumbar Spine Complete  Result Date: 12/30/2018 CLINICAL DATA:  Recent fall with low back pain, initial encounter EXAM: LUMBAR SPINE - COMPLETE 4+ VIEW COMPARISON:  None. FINDINGS: Five lumbar type vertebral bodies are well visualized. Osteophytic changes are noted worst at L2-3 and L4-5. Facet hypertrophic changes are noted. No anterolisthesis is seen. No soft tissue abnormality is noted. IMPRESSION: Mild degenerative change without acute abnormality. Electronically Signed   By: Inez Catalina  M.D.   On: 12/30/2018 08:51   Ct Head Wo Contrast  Result Date: 12/30/2018 CLINICAL DATA:  Follow-up subdural hemorrhage. EXAM: CT HEAD WITHOUT CONTRAST TECHNIQUE: Contiguous axial images were obtained from the base of the skull through the vertex without intravenous contrast. COMPARISON:  12/30/2018 FINDINGS: Brain: Small volume parafalcine subdural hematoma measures up to 3 mm in thickness, unchanged. Minimal subdural blood along the right tentorium is also unchanged. No convincing subarachnoid hemorrhage is identified within limitations of mild motion artifact. No acute infarct, mass, or midline shift is evident. The ventricles and sulci are normal in size. Vascular: Calcified atherosclerosis at the skull base. Skull: No fracture or focal osseous lesion. Sinuses/Orbits: Mild, partially polypoid right sphenoid sinus mucosal thickening. Clear mastoid air cells. Bilateral cataract extraction. Other: Right-sided scalp skin staples.  IMPRESSION: 1. Unchanged small parafalcine and right tentorial subdural hematomas. 2. No evidence of new intracranial abnormality. Electronically Signed   By: Logan Bores M.D.   On: 12/30/2018 18:10   Ct Head Wo Contrast  Result Date: 12/30/2018 CLINICAL DATA:  Pain following fall.  History of ovarian carcinoma EXAM: CT HEAD WITHOUT CONTRAST CT CERVICAL SPINE WITHOUT CONTRAST TECHNIQUE: Multidetector CT imaging of the head and cervical spine was performed following the standard protocol without intravenous contrast. Multiplanar CT image reconstructions of the cervical spine were also generated. COMPARISON:  None. FINDINGS: CT HEAD FINDINGS Brain: The ventricles are normal in size and configuration. There is a small amount of hemorrhage in the anterior to mid falx consistent with a small falcine subdural hematoma. There is no appreciable mass effect in this area. A small amount of subarachnoid hemorrhage is noted in the left paramedian frontal region near the falcine subdural  hematoma. There is equivocal small amount of right frontal subarachnoid hemorrhage as well, not causing mass effect. No other foci of hemorrhage identified. No midline shift. Beyond the mild hemorrhage within the falx, there is no other extra-axial fluid collection. No findings indicative of focal infarct. No intracranial edema evident. Vascular: No hyperdense vessel. There are foci of arterial vascular calcification in the carotid siphon regions. Skull: The bony calvarium appears intact. Sinuses/Orbits: Visualized paranasal sinuses are clear. There is mild rightward deviation of the nasal septum. Orbits appear symmetric bilaterally. Other: Mastoid air cells are clear. CT CERVICAL SPINE FINDINGS Alignment: There is no appreciable spondylolisthesis. Skull base and vertebrae: Skull base and craniocervical junction regions appear normal. No fractures evident. Bones are somewhat osteoporotic. No blastic or lytic bone lesions. Soft tissues and spinal canal: Prevertebral soft tissues and predental space regions are normal. There is no evident cord or canal hematoma. No paraspinous lesions are demonstrable. Disc levels: There is moderate disc space narrowing at C5-6 and C6-7. There are anterior osteophytes at C5, C6, and C7. There is facet hypertrophy at multiple levels, most severe at C3-4 on the right. There is exit foraminal narrowing due to bony hypertrophy at C5-6 and C6-7 bilaterally. No frank disc extrusion or stenosis. Upper chest: Visualized upper lung regions are clear. Other: None IMPRESSION: Head CT: 1. Small amount of falcine subdural hematoma without appreciable mass effect. Thickness of the hemorrhage within this area measures 3 mm. No other extra-axial fluid. 2. Small amount of subarachnoid hemorrhage in the right frontal and paramedian left frontal lobe regions without mass effect or edema. 3.  Elsewhere brain parenchyma appears unremarkable. 4.  There are foci of arterial vascular calcification. CT  cervical spine: 1.  No fracture or spondylolisthesis. 2. Osteoarthritic change at several levels. No disc extrusion or stenosis evident. 3.  No blastic or lytic bone lesions.  Bone somewhat osteoporotic. Critical Value/emergent results were called by telephone at the time of interpretation on 12/30/2018 at 8:27 am to providerDr. Tomi Bamberger, ED physician, who verbally acknowledged these results. Electronically Signed   By: Lowella Grip III M.D.   On: 12/30/2018 08:27   Ct Cervical Spine Wo Contrast  Result Date: 12/30/2018 CLINICAL DATA:  Pain following fall.  History of ovarian carcinoma EXAM: CT HEAD WITHOUT CONTRAST CT CERVICAL SPINE WITHOUT CONTRAST TECHNIQUE: Multidetector CT imaging of the head and cervical spine was performed following the standard protocol without intravenous contrast. Multiplanar CT image reconstructions of the cervical spine were also generated. COMPARISON:  None. FINDINGS: CT HEAD FINDINGS Brain: The ventricles are normal in size and configuration. There  is a small amount of hemorrhage in the anterior to mid falx consistent with a small falcine subdural hematoma. There is no appreciable mass effect in this area. A small amount of subarachnoid hemorrhage is noted in the left paramedian frontal region near the falcine subdural hematoma. There is equivocal small amount of right frontal subarachnoid hemorrhage as well, not causing mass effect. No other foci of hemorrhage identified. No midline shift. Beyond the mild hemorrhage within the falx, there is no other extra-axial fluid collection. No findings indicative of focal infarct. No intracranial edema evident. Vascular: No hyperdense vessel. There are foci of arterial vascular calcification in the carotid siphon regions. Skull: The bony calvarium appears intact. Sinuses/Orbits: Visualized paranasal sinuses are clear. There is mild rightward deviation of the nasal septum. Orbits appear symmetric bilaterally. Other: Mastoid air cells are  clear. CT CERVICAL SPINE FINDINGS Alignment: There is no appreciable spondylolisthesis. Skull base and vertebrae: Skull base and craniocervical junction regions appear normal. No fractures evident. Bones are somewhat osteoporotic. No blastic or lytic bone lesions. Soft tissues and spinal canal: Prevertebral soft tissues and predental space regions are normal. There is no evident cord or canal hematoma. No paraspinous lesions are demonstrable. Disc levels: There is moderate disc space narrowing at C5-6 and C6-7. There are anterior osteophytes at C5, C6, and C7. There is facet hypertrophy at multiple levels, most severe at C3-4 on the right. There is exit foraminal narrowing due to bony hypertrophy at C5-6 and C6-7 bilaterally. No frank disc extrusion or stenosis. Upper chest: Visualized upper lung regions are clear. Other: None IMPRESSION: Head CT: 1. Small amount of falcine subdural hematoma without appreciable mass effect. Thickness of the hemorrhage within this area measures 3 mm. No other extra-axial fluid. 2. Small amount of subarachnoid hemorrhage in the right frontal and paramedian left frontal lobe regions without mass effect or edema. 3.  Elsewhere brain parenchyma appears unremarkable. 4.  There are foci of arterial vascular calcification. CT cervical spine: 1.  No fracture or spondylolisthesis. 2. Osteoarthritic change at several levels. No disc extrusion or stenosis evident. 3.  No blastic or lytic bone lesions.  Bone somewhat osteoporotic. Critical Value/emergent results were called by telephone at the time of interpretation on 12/30/2018 at 8:27 am to providerDr. Tomi Bamberger, ED physician, who verbally acknowledged these results. Electronically Signed   By: Lowella Grip III M.D.   On: 12/30/2018 08:27   Ct Abdomen Pelvis W Contrast  Addendum Date: 12/15/2018   ADDENDUM REPORT: 12/15/2018 12:23 ADDENDUM: The original report used examination from 04/08/2018 as comparison. The following is a comparison  to the more recent previous exam from 09/15/2018. CLINICAL DATA:   Peritoneal carcinoma.  Restaging. EXAM: CT ABDOMEN AND PELVIS WITH CONTRAST TECHNIQUE: Multidetector CT imaging of the abdomen and pelvis was performed using the standard protocol following bolus administration of intravenous contrast. CONTRAST:   184m OMNIPAQUE IOHEXOL 300 MG/ML  SOLN COMPARISON: 09/15/2018 FINDINGS: Lower chest: Bibasilar linear scarring identified. Hepatobiliary: There is a diffusely nodular contour to the liver compatible with cirrhosis. No focal liver abnormality identified. The gallbladder is unremarkable. Pancreas: Unremarkable. No pancreatic ductal dilatation or surrounding inflammatory changes. Spleen: The spleen is enlarged measuring 13.6 cm in length. Adrenals/Urinary Tract: Normal appearance of the adrenal glands. No kidney mass or hydronephrosis identified. The urinary bladder appears normal. Stomach/Bowel: The stomach is unremarkable. No small bowel wall thickening, inflammation or distension. Persistent wall thickening involving the cecum which is contiguous with adjacent peritoneal thickening and nodularity. Cannot rule out serosal  involvement by tumor. No pathologic dilatation of the colon noted. Vascular/Lymphatic: Aortic atherosclerosis. No aneurysm. Left periaortic lymph node measures 1 cm, image 22/2. Previously 1.1 cm. Central small bowel mesenteric node measures 1 cm, image 54/2. Previously 1.0 cm. No enlarged pelvic or inguinal lymph nodes. Reproductive: Status post hysterectomy. Soft tissue nodule within the left pelvis contiguous with the left side of vaginal cuff measures 2.3 x 2.1 cm, image 75/2. Previously 2.7 x 2.3 cm. Other: There is mild bilateral peritoneal thickening. Similar to previous exam. Soft tissue nodule within the right posterior iliac fossa measures 0.8 cm, image 50/2. Previously this measured 0.9 cm. Musculoskeletal: No acute or significant osseous findings. Stable superior endplate  deformity involving the L3 vertebra. IMPRESSION: 1. Mild improvement in peritoneal disease. Peritoneal nodule within the left posterior pelvis contiguous with the left side of vaginal cuff is slightly decreased in size in the interval. Within the right iliac fossa there is a small peritoneal nodule which has decreased in size in the interval. Central small bowel mesenteric lymph node is not significantly changed. Slight decrease in size left periaortic lymph node. 2. Morphologic features of the liver compatible with cirrhosis. Splenomegaly is noted which may reflect portal venous hypertension. Electronically Signed   By: Kerby Moors M.D.   On: 12/15/2018 12:23   Result Date: 12/15/2018 CLINICAL DATA:  Peritoneal carcinoma.  Restaging. EXAM: CT ABDOMEN AND PELVIS WITH CONTRAST TECHNIQUE: Multidetector CT imaging of the abdomen and pelvis was performed using the standard protocol following bolus administration of intravenous contrast. CONTRAST:  15m OMNIPAQUE IOHEXOL 300 MG/ML  SOLN COMPARISON:  04/08/2018 FINDINGS: Lower chest: Bibasilar linear scarring identified. Hepatobiliary: There is a diffusely nodular contour to the liver compatible with cirrhosis. No focal liver abnormality identified. The gallbladder is unremarkable. Pancreas: Unremarkable. No pancreatic ductal dilatation or surrounding inflammatory changes. Spleen: The spleen is enlarged measuring 13.6 cm in length. Adrenals/Urinary Tract: Normal appearance of the adrenal glands. No kidney mass or hydronephrosis identified. The urinary bladder appears normal. Stomach/Bowel: The stomach is unremarkable. No small bowel wall thickening, inflammation or distension. Persistent wall thickening involving the cecum which is contiguous with adjacent peritoneal thickening and nodularity. Cannot rule out serosal involvement by tumor. No pathologic dilatation of the colon noted. Vascular/Lymphatic: Aortic atherosclerosis. No aneurysm. Left periaortic lymph node  measures 1 cm, image 22/2. Unchanged. Central small bowel mesenteric node measures 1 cm, image 54/2. Previously 0.8 cm. No enlarged pelvic or inguinal lymph nodes. Reproductive: Status post hysterectomy. Soft tissue nodule within the left pelvis contiguous with the left side of vaginal cuff measures 2.3 x 2.1 cm, image 75/2. Previously 1.9 x 1.6 cm. Other: There is mild bilateral peritoneal thickening. Similar to previous exam. Soft tissue nodule within the right posterior iliac fossa measures 0.8 cm, image 50/2. Previously this measured 0.4 cm. Musculoskeletal: No acute or significant osseous findings. Stable superior endplate deformity involving the L3 vertebra. IMPRESSION: 1. Mild progression of disease. Peritoneal nodule within the left posterior pelvis contiguous with the left side of vaginal cuff is slightly increased in size in the interval. Within the right iliac fossa there is a small peritoneal nodule which has increased in size in the interval. Central small bowel mesenteric lymph node is mildly increased in size. Currently 1 cm. Previously 0.8 cm. Persistent mural thickening involving the cecum which appears contiguous with peritoneal thickening is concerning for serosal involvement by tumor. 2. Morphologic features of the liver compatible with cirrhosis. Splenomegaly is noted which may reflect portal venous hypertension. Electronically  Signed: By: Kerby Moors M.D. On: 12/14/2018 14:24   Dg Chest Port 1 View  Result Date: 12/30/2018 CLINICAL DATA:  Fever EXAM: PORTABLE CHEST 1 VIEW COMPARISON:  2015 FINDINGS: Right chest wall port catheter tip overlies the central SVC. Lungs are clear. No pleural effusion. Cardiomediastinal contours are within normal limits with normal heart size. IMPRESSION: No active disease. Electronically Signed   By: Macy Mis M.D.   On: 12/30/2018 07:27   Vas [place] Upper Extremity Venous Duplex  Result Date: 12/31/2018 UPPER VENOUS STUDY  Indications: Erythema,  Edema, and Pain status post IV Vancomycin Limitations: Interstitial edema and pain with touch. Comparison Study: No prior study on file for comparison Performing Technologist: Sharion Dove RVS  Examination Guidelines: A complete evaluation includes B-mode imaging, spectral Doppler, color Doppler, and power Doppler as needed of all accessible portions of each vessel. Bilateral testing is considered an integral part of a complete examination. Limited examinations for reoccurring indications may be performed as noted.  Right Findings: +----------+------------+---------+-----------+----------+---------------+ RIGHT     CompressiblePhasicitySpontaneousProperties    Summary     +----------+------------+---------+-----------+----------+---------------+ IJV           Full       Yes       Yes                              +----------+------------+---------+-----------+----------+---------------+ Subclavian    Full       Yes       Yes                              +----------+------------+---------+-----------+----------+---------------+ Axillary      Full       Yes       Yes                              +----------+------------+---------+-----------+----------+---------------+ Brachial      Full       Yes       Yes                              +----------+------------+---------+-----------+----------+---------------+ Radial                                              patent by color +----------+------------+---------+-----------+----------+---------------+ Ulnar                                               patent by color +----------+------------+---------+-----------+----------+---------------+ Cephalic      Full                                                  +----------+------------+---------+-----------+----------+---------------+ Basilic       Full                                                   +----------+------------+---------+-----------+----------+---------------+  Left Findings: +----------+------------+---------+-----------+----------+-------+ LEFT      CompressiblePhasicitySpontaneousPropertiesSummary +----------+------------+---------+-----------+----------+-------+ Subclavian               Yes       Yes                      +----------+------------+---------+-----------+----------+-------+  Summary:  Right: No evidence of deep vein thrombosis in the upper extremity. No evidence of superficial vein thrombosis in the upper extremity.  Left: No evidence of thrombosis in the subclavian.  *See table(s) above for measurements and observations.  Diagnosing physician: Monica Martinez MD Electronically signed by Monica Martinez MD on 12/31/2018 at 7:11:02 PM.    Final     Time coordinating discharge: Over 30 minutes  SIGNED:   Guilford Shi, MD  [facility] 01/04/2019, 1:13 PM Pager : [phone]

## 2019-01-05 ENCOUNTER — Other Ambulatory Visit: Payer: Medicare Other

## 2019-01-05 ENCOUNTER — Ambulatory Visit: Payer: Medicare Other | Admitting: Hematology and Oncology

## 2019-01-05 ENCOUNTER — Ambulatory Visit: Payer: Medicare Other

## 2019-01-06 ENCOUNTER — Telehealth: Payer: Self-pay | Admitting: Hematology and Oncology

## 2019-01-06 NOTE — Telephone Encounter (Signed)
Scheduled appt per 11/23 sch message - pt is aware of appt date and time

## 2019-01-11 ENCOUNTER — Ambulatory Visit: Payer: Medicare Other | Admitting: Hematology and Oncology

## 2019-01-11 ENCOUNTER — Other Ambulatory Visit: Payer: Medicare Other

## 2019-01-11 ENCOUNTER — Ambulatory Visit: Payer: Medicare Other

## 2019-01-19 ENCOUNTER — Inpatient Hospital Stay: Payer: Medicare Other

## 2019-01-19 ENCOUNTER — Other Ambulatory Visit: Payer: Self-pay | Admitting: Hematology and Oncology

## 2019-01-19 ENCOUNTER — Inpatient Hospital Stay: Payer: Medicare Other | Attending: Hematology and Oncology

## 2019-01-19 ENCOUNTER — Other Ambulatory Visit: Payer: Self-pay

## 2019-01-19 ENCOUNTER — Inpatient Hospital Stay (HOSPITAL_BASED_OUTPATIENT_CLINIC_OR_DEPARTMENT_OTHER): Payer: Medicare Other | Admitting: Hematology and Oncology

## 2019-01-19 DIAGNOSIS — Z7189 Other specified counseling: Secondary | ICD-10-CM

## 2019-01-19 DIAGNOSIS — D61818 Other pancytopenia: Secondary | ICD-10-CM

## 2019-01-19 DIAGNOSIS — C569 Malignant neoplasm of unspecified ovary: Secondary | ICD-10-CM

## 2019-01-19 DIAGNOSIS — Z79899 Other long term (current) drug therapy: Secondary | ICD-10-CM | POA: Diagnosis not present

## 2019-01-19 DIAGNOSIS — T451X5A Adverse effect of antineoplastic and immunosuppressive drugs, initial encounter: Secondary | ICD-10-CM

## 2019-01-19 DIAGNOSIS — M545 Low back pain, unspecified: Secondary | ICD-10-CM

## 2019-01-19 DIAGNOSIS — C482 Malignant neoplasm of peritoneum, unspecified: Secondary | ICD-10-CM

## 2019-01-19 DIAGNOSIS — D61810 Antineoplastic chemotherapy induced pancytopenia: Secondary | ICD-10-CM

## 2019-01-19 DIAGNOSIS — K7460 Unspecified cirrhosis of liver: Secondary | ICD-10-CM | POA: Insufficient documentation

## 2019-01-19 DIAGNOSIS — Z5111 Encounter for antineoplastic chemotherapy: Secondary | ICD-10-CM | POA: Insufficient documentation

## 2019-01-19 DIAGNOSIS — K7581 Nonalcoholic steatohepatitis (NASH): Secondary | ICD-10-CM | POA: Insufficient documentation

## 2019-01-19 DIAGNOSIS — Z794 Long term (current) use of insulin: Secondary | ICD-10-CM | POA: Diagnosis not present

## 2019-01-19 DIAGNOSIS — G8929 Other chronic pain: Secondary | ICD-10-CM

## 2019-01-19 LAB — CMP (CANCER CENTER ONLY)
ALT: 17 U/L (ref 0–44)
AST: 22 U/L (ref 15–41)
Albumin: 3.5 g/dL (ref 3.5–5.0)
Alkaline Phosphatase: 118 U/L (ref 38–126)
Anion gap: 10 (ref 5–15)
BUN: 23 mg/dL (ref 8–23)
CO2: 26 mmol/L (ref 22–32)
Calcium: 8.9 mg/dL (ref 8.9–10.3)
Chloride: 106 mmol/L (ref 98–111)
Creatinine: 0.98 mg/dL (ref 0.44–1.00)
GFR, Est AFR Am: >60 mL/min (ref >60)
GFR, Estimated: 58 mL/min — ABNORMAL LOW (ref >60)
Glucose, Bld: 151 mg/dL — ABNORMAL HIGH (ref 70–99)
Potassium: 3.6 mmol/L (ref 3.5–5.1)
Sodium: 142 mmol/L (ref 135–145)
Total Bilirubin: 0.8 mg/dL (ref 0.3–1.2)
Total Protein: 8.3 g/dL — ABNORMAL HIGH (ref 6.5–8.1)

## 2019-01-19 LAB — CBC WITH DIFFERENTIAL (CANCER CENTER ONLY)
Abs Immature Granulocytes: 0.02 10*3/uL (ref 0.00–0.07)
Basophils Absolute: 0 10*3/uL (ref 0.0–0.1)
Basophils Relative: 0 %
Eosinophils Absolute: 0.1 10*3/uL (ref 0.0–0.5)
Eosinophils Relative: 1 %
HCT: 30.8 % — ABNORMAL LOW (ref 36.0–46.0)
Hemoglobin: 9.7 g/dL — ABNORMAL LOW (ref 12.0–15.0)
Immature Granulocytes: 0 %
Lymphocytes Relative: 23 %
Lymphs Abs: 1.1 10*3/uL (ref 0.7–4.0)
MCH: 30.8 pg (ref 26.0–34.0)
MCHC: 31.5 g/dL (ref 30.0–36.0)
MCV: 97.8 fL (ref 80.0–100.0)
Monocytes Absolute: 0.4 10*3/uL (ref 0.1–1.0)
Monocytes Relative: 9 %
Neutro Abs: 3.3 10*3/uL (ref 1.7–7.7)
Neutrophils Relative %: 67 %
Platelet Count: 117 10*3/uL — ABNORMAL LOW (ref 150–400)
RBC: 3.15 MIL/uL — ABNORMAL LOW (ref 3.87–5.11)
RDW: 16.8 % — ABNORMAL HIGH (ref 11.5–15.5)
WBC Count: 4.9 10*3/uL (ref 4.0–10.5)
nRBC: 0 % (ref 0.0–0.2)

## 2019-01-19 LAB — SAMPLE TO BLOOD BANK

## 2019-01-19 MED ORDER — SODIUM CHLORIDE 0.9 % IV SOLN
Freq: Once | INTRAVENOUS | Status: AC
  Administered 2019-01-19: 14:00:00 via INTRAVENOUS
  Filled 2019-01-19: qty 250

## 2019-01-19 MED ORDER — DEXAMETHASONE SODIUM PHOSPHATE 10 MG/ML IJ SOLN
INTRAMUSCULAR | Status: AC
  Filled 2019-01-19: qty 1

## 2019-01-19 MED ORDER — HYDROCODONE-ACETAMINOPHEN 5-325 MG PO TABS: 1.0000 | ORAL_TABLET | Freq: Four times a day (QID) | ORAL | 0 refills | Status: DC | PRN

## 2019-01-19 MED ORDER — SODIUM CHLORIDE 0.9 % IV SOLN
37.5000 mg/m2 | Freq: Once | INTRAVENOUS | Status: AC
  Administered 2019-01-19: 70 mg via INTRAVENOUS
  Filled 2019-01-19: qty 7

## 2019-01-19 MED ORDER — SODIUM CHLORIDE 0.9 % IV SOLN: 5.0000 mg | Freq: Once | INTRAVENOUS | Status: DC

## 2019-01-19 MED ORDER — DEXAMETHASONE SODIUM PHOSPHATE 10 MG/ML IJ SOLN
5.0000 mg | Freq: Once | INTRAMUSCULAR | Status: AC
  Administered 2019-01-19: 5 mg via INTRAVENOUS

## 2019-01-19 MED ORDER — HEPARIN SOD (PORK) LOCK FLUSH 100 UNIT/ML IV SOLN
500.0000 [IU] | Freq: Once | INTRAVENOUS | Status: AC | PRN
  Administered 2019-01-19: 500 [IU]
  Filled 2019-01-19: qty 5

## 2019-01-19 MED ORDER — SODIUM CHLORIDE 0.9% FLUSH
10.0000 mL | Freq: Once | INTRAVENOUS | Status: AC
  Administered 2019-01-19: 10 mL
  Filled 2019-01-19: qty 10

## 2019-01-19 MED ORDER — PEGFILGRASTIM 6 MG/0.6ML ~~LOC~~ PSKT
6.0000 mg | PREFILLED_SYRINGE | Freq: Once | SUBCUTANEOUS | Status: AC
  Administered 2019-01-19: 6 mg via SUBCUTANEOUS

## 2019-01-19 MED ORDER — SODIUM CHLORIDE 0.9% FLUSH
10.0000 mL | INTRAVENOUS | Status: DC | PRN
  Administered 2019-01-19: 10 mL
  Filled 2019-01-19: qty 10

## 2019-01-19 MED ORDER — PEGFILGRASTIM 6 MG/0.6ML ~~LOC~~ PSKT
PREFILLED_SYRINGE | SUBCUTANEOUS | Status: AC
  Filled 2019-01-19: qty 0.6

## 2019-01-19 NOTE — Patient Instructions (Signed)
[facility] Discharge Instructions for Patients Receiving Chemotherapy  Today you received the following chemotherapy agents taxotere   To help prevent nausea and vomiting after your treatment, we encourage you to take your nausea medication as directed   If you develop nausea and vomiting that is not controlled by your nausea medication, call the clinic.   BELOW ARE SYMPTOMS THAT SHOULD BE REPORTED IMMEDIATELY:  *FEVER GREATER THAN 100.5 F  *CHILLS WITH OR WITHOUT FEVER  NAUSEA AND VOMITING THAT IS NOT CONTROLLED WITH YOUR NAUSEA MEDICATION  *UNUSUAL SHORTNESS OF BREATH  *UNUSUAL BRUISING OR BLEEDING  TENDERNESS IN MOUTH AND THROAT WITH OR WITHOUT PRESENCE OF ULCERS  *URINARY PROBLEMS  *BOWEL PROBLEMS  UNUSUAL RASH Items with * indicate a potential emergency and should be followed up as soon as possible.  Feel free to call the clinic you have any questions or concerns. The clinic phone number is [phone].

## 2019-01-20 ENCOUNTER — Encounter: Payer: Self-pay | Admitting: Hematology and Oncology

## 2019-01-20 LAB — CA 125: Cancer Antigen (CA) 125: 69.7 U/mL — ABNORMAL HIGH (ref 0.0–38.1)

## 2019-01-20 NOTE — Assessment & Plan Note (Signed)
She has multifactorial pain including pain on her skull due to recent fall I refill her prescription pain medicine

## 2019-01-20 NOTE — Progress Notes (Signed)
[facility] OFFICE PROGRESS NOTE  Patient Care Team: Gregor Hams, FNP as PCP - General (Family Medicine)  ASSESSMENT & PLAN:  Primary peritoneal carcinomatosis [facility]) She is recovered from recent hospitalization I plan to reduce dose of Taxotere to 50%, along with G-CSF support to avoid sepsis The patient and her husband is educated to watch for signs and symptoms of neutropenic fever  Pancytopenia, acquired ([place]) She has severe, persistent pancytopenia despite recent dose adjustment I plan to proceed with treatment today with 50% chemo dose adjustment She is not symptomatic and does not need transfusion support  Low back pain She has multifactorial pain including pain on her skull due to recent fall I refill her prescription pain medicine  Liver cirrhosis secondary to NASH [facility]) Liver function is stable Observe closely   No orders of the defined types were placed in this encounter.   INTERVAL HISTORY: Please see below for problem oriented charting. She returns for cycle 2 of chemotherapy with her husband She was recently hospitalization after presentation with neutropenic fever and fall She is almost completely recovered She still have significant pain due to recent fall No recent bleeding Denies further fever or chills She has right upper arm swelling which is gradually improving.  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 Had allergic reaction to carboplatin. Avastin was stopped due to GI hemorrhage   Primary peritoneal carcinomatosis ([place])  09/06/2011 Imaging   CT findings consistent with cirrhotic changes involving the liver.  No worrisome liver mass.  There are associated portal venous collaterals and splenomegaly consistent with portal venous hypertension. 2.  No other significant upper  abdominal findings   11/08/2013 Imaging   US showed ascites   11/08/2013 Initial Diagnosis   Patient had ~ 2 months of some urinary incontinence, saw Dr Ronita Hipps in 10-2013, with pelvic mass on exam    11/12/2013 Imaging   There are findings of progressive hepatic cirrhosis with a large amount of ascites and mild splenomegaly. There are venous collaterals present. 2. There is abnormal thickening of the peritoneal surface along the left mid and lower abdominal wall. There is abnormal soft tissue density in the pelvis as well which could reflect the clinically suspected ovarian malignancy but the findings are nondiagnostic. 3. There is a new small left pleural effusion. 4. There is no acute bowel abnormality.   11/18/2013 Imaging   Successful ultrasound guided diagnostic and therapeutic paracentesis yielding 4.1 liters of ascites.    11/18/2013 Pathology Results   PERITONEAL/ASCITIC FLUID (SPECIMEN 1 OF 1 COLLECTED 11/18/13): SEROUS CARCINOMA, PLEASE SEE COMMENT.   12/01/2013 Tumor Marker   Patient's tumor was tested for the following markers: CA125 Results of the tumor marker test revealed 1938   12/03/2013 Imaging   Successful ultrasound-guided therapeutic paracentesis yielding 3.6 liters of peritoneal fluid.   12/07/2013 - 05/30/2014 Chemotherapy   She received 3 cycles of carboplatin and Taxol, interrupted cycle 4 for surgery.  She subsequently completed 3 more cycles of chemotherapy after surgery   12/20/2013 Imaging   Successful ultrasound-guided diagnostic and therapeutic paracentesis yielding 2.2 liters of peritoneal fluid   12/20/2013 Pathology Results   PERITONEAL/ASCITIC FLUID(SPECIMEN 1 OF 1 COLLECTED 12/20/13): MALIGNANT CELLS CONSISTENT WITH SEROUS CARCINOMA   01/13/2014 Tumor Marker   Patient's tumor was tested for the following markers: CA125 Results of the tumor marker test revealed 227  02/07/2014 Tumor Marker   Patient's tumor was tested for the following markers:  CA125 Results of the tumor marker test revealed 130   02/15/2014 Genetic Testing   Genetics testing from 02-2014 normal ([employer] panel)   02/23/2014 Imaging   Interval decrease in ascites. 2. Morphologic changes in the liver consistent with cirrhosis. 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 Portal vein remains patent at this time. 3. Persistent but decreased abnormal soft tissue attenuation tracking in the omental fat. This may be secondary to interval improvement in metastatic disease. 4. Peritoneal thickening seen in the left abdomen and pelvis on the previous study has decreased and nearly resolved in the interval. This also suggests interval improvement in metastatic disease.    03/07/2014 Procedure   Ultrasound and fluoroscopically guided right internal jugular single lumen power port catheter insertion. Tip in the SVC/RA junction. Catheter ready for use.   03/17/2014 Tumor Marker   Patient's tumor was tested for the following markers: CA125 Results of the tumor marker test revealed 45   03/29/2014 Pathology Results   1. Omentum, resection for tumor - HIGH GRADE SEROUS CARCINOMA, SEE COMMENT. 2. Ovary and fallopian tube, right - HIGH GRADE SEROUS CARCINOMA, SEE COMMENT. 3. Ovary and fallopian tube, left - HIGH GRADE SEROUS CARCINOMA, SEE COMMENT. Diagnosis Note 1. Nests and clusters of malignant cells are invading the omental tissue (part #1) with associated fibrosis. The cells are pleomorphic with prominent nucleoli. There are scattered psammoma bodies. The ovaries are atrophic and exhibit multiple foci of surface based invasive carcinoma and associated fibrosis. The fallopian tubes have a few foci of carcinoma, also superficially located. There are no precursor lesions noted in the ovaries or fallopian tubes (the entire tubes and ovaries were submitted for evaluation). While there is some retraction artifact, there are several foci suspicious for  lymphovascular invasion. 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 Given the fibrosis, there does appear to be a small amount of treatment effect, however, there is abundant residual tumor.    03/29/2014 Surgery   Procedure(s) Performed: 1. Exploratory laparotomy with bilateral salpingo-oophorectomy, omentectomy radical tumor debulking for ovarian cancer .  Surgeon: Thereasa Solo, MD.  Assistant Surgeon: Lahoma Crocker, M.D. Assistant: (an MD assistant was necessary for tissue manipulation, retraction and positioning due to the complexity of the case and hospital policies).  Operative Findings: 10cm omental cake from hepatic to splenic flexure, densely adherent to transverse colon. Milliary studding of tumor implants (<61m, too numerous in number to count) adherent to the mesentery of the small bowel and small bowel wall. Small volume (100cc) ascites. Small ovaries bilaterally, densely adherent to the pelvic cul de sac. Left ovary and tube densely adherent to sigmoid colon. Cirrhotic liver with hepatomegaly and splenomegaly.     This represented an optimal cytoreduction (R1) with visible disease residual on the bowel wall and mesentery (millial, <380mimplants).     04/18/2014 Tumor Marker   Patient's tumor was tested for the following markers: CA125 Results of the tumor marker test revealed 122   05/16/2014 Tumor Marker   Patient's tumor was tested for the following markers: CA125 Results of the tumor marker test revealed 30   06/10/2014 Tumor Marker   Patient's tumor was tested for the following markers: CA125 Results of the tumor marker test revealed 19   07/04/2014 Imaging   Interval improvement in the  appearance of peritoneal metastasis secondary to ovarian cancer. 2. 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    08/18/2014 Tumor  Marker   Patient's tumor was tested for the following markers: CA125 Results of the tumor marker test revealed 16   10/13/2014 Tumor Marker   Patient's tumor was tested for the following markers: CA125 Results of the tumor marker test revealed 14   11/21/2014 Tumor Marker   Patient's tumor was tested for the following markers: CA125 Results of the tumor marker test revealed 16   12/28/2014 Tumor Marker   Patient's tumor was tested for the following markers: CA125 Results of the tumor marker test revealed 762   02/27/2015 Tumor Marker   Patient's tumor was tested for the following markers: CA125 Results of the tumor marker test revealed 20.2   03/22/2015 Imaging   Filler appearance to the prior exam, with very subtle fluid tracking along portions of the liver, and very minimal nodularity along the right paracolic gutter representing residua from the prior peritoneal cancer. The current abnormalities could simply be post therapy findings rather than necessarily representing residual malignancy. No new or enlarging lesions are identified. 2. Hepatic cirrhosis and splenomegaly. There is some gastric varices suggesting portal venous hypertension. 3. Left foraminal stenosis at L4-5 due to spurring. There is likely also some central narrowing of the thecal sac at this level. 4. Bibasilar scarring. 5. Chronic mild left mid kidney scarring   03/27/2015 Tumor Marker   Patient's tumor was tested for the following markers: CA125 Results of the tumor marker test revealed 25.3   04/24/2015 Imaging   Disc bulge L2-3 with mild spinal stenosis and right foraminal encroachment. 2. Disc bulge L3-4 with mild spinal stenosis. 3. Multifactorial spinal, left lateral recess and foraminal stenosis L4-5. There is grade 1 anterolisthesis without evident dynamic instability.   05/29/2015 Tumor Marker   Patient's tumor was tested for the following markers: CA125 Results of the tumor marker test revealed 29.9    08/21/2015 Tumor Marker   Patient's tumor was tested for the following markers: CA125 Results of the tumor marker test revealed 45   09/18/2015 Tumor Marker   Patient's tumor was tested for the following markers: CA125 Results of the tumor marker test revealed 47   09/27/2015 Imaging   New small bowel mesenteric nodule and enlarged left external iliac lymph node, highly worrisome for metastatic disease. 2. Cirrhosis and splenomegaly   10/10/2015 Tumor Marker   Patient's tumor was tested for the following markers: CA125 Results of the tumor marker test revealed 53.4   10/10/2015 - 12/11/2015 Chemotherapy   She received 4 cycles of carboplatin only   11/20/2015 Tumor Marker   Patient's tumor was tested for the following markers: CA125 Results of the tumor marker test revealed 41.3   12/20/2015 Imaging   CT: Mild left external iliac lymphadenopathy is slightly decreased. Mildly enlarged lower mesenteric nodule is slightly decreased. 2. No new or progressive metastatic disease in the abdomen or pelvis. 3. Cirrhosis. Stable subcentimeter hypodense left liver lobe lesion. No new liver lesions. 4. Stable mild splenomegaly.  No ascites. 5. Mild sigmoid diverticulosis.   01/01/2016 -  Anti-estrogen oral therapy   She was placed on tamoxifen. Had letrozole initially but switched to tamoxifen due to poor tolerance   01/15/2016 Tumor Marker   Patient's tumor was tested for the following markers: CA125 Results of the tumor marker test revealed 31.4   02/19/2016 Tumor Marker   Patient's tumor was tested for the  following markers: CA125 Results of the tumor marker test revealed 36.5   05/13/2016 Imaging   No evidence of disease progression within the abdomen or pelvis. Previously noted small central mesenteric nodule and left external iliac node appear slightly smaller. 2. Stable changes of hepatic cirrhosis and portal hypertension with associated splenomegaly. No new or enlarging hepatic lesions are  identified. 3. No acute findings.   05/13/2016 Tumor Marker   Patient's tumor was tested for the following markers: CA125 Results of the tumor marker test revealed 41.2   06/24/2016 Tumor Marker   Patient's tumor was tested for the following markers: CA125 Results of the tumor marker test revealed 47.3   08/20/2016 Imaging   1. The left external iliac lymph node is slightly increased in size compared to the prior exam, currently 1.1 cm and previously 0.9 cm in diameter. 2. Stable central mesenteric lymph node at 0.9 cm in diameter. 3. Stable trace free pelvic fluid and stable slight thickening along the right paracolic gutter, without well-defined peritoneal nodularity. 4. Other imaging findings of potential clinical significance: Subsegmental atelectasis or scarring in the lung bases. Hepatic cirrhosis. Left renal scarring. Mild splenomegaly. Sigmoid colon diverticulosis. Impingement at L4-5 due to spondylosis and degenerative disc disease broad Schmorl' s nodes at L3-L4. Pelvic floor laxity   08/23/2016 Tumor Marker   Patient's tumor was tested for the following markers: CA125 Results of the tumor marker test revealed 80.2   09/04/2016 Imaging   LV EF: 60% -   65%   09/12/2016 Tumor Marker   Patient's tumor was tested for the following markers: CA125 Results of the tumor marker test revealed 98.5   09/12/2016 - 01/29/2018 Chemotherapy   The patient received Doxil and Avastin. Avastin was discontinued in Dec 2019 due to GI hemorrhage   09/26/2016 Tumor Marker   Patient's tumor was tested for the following markers: CA125 Results of the tumor marker test revealed 68.9   10/10/2016 Tumor Marker   Patient's tumor was tested for the following markers: CA125 Results of the tumor marker test revealed 65.6   11/07/2016 Tumor Marker   Patient's tumor was tested for the following markers: CA125 Results of the tumor marker test revealed 46.4   11/29/2016 Imaging   Stable mild peritoneal  thickening, suspicious for peritoneal carcinomatosis. No new or progressive disease identified. No evidence of ascites.  No significant change in 10 mm left external iliac and 8 mm mesenteric lymph nodes.  Stable hepatic cirrhosis, and splenomegaly consistent with portal venous hypertension.  Colonic diverticulosis, without radiographic evidence of diverticulitis.   12/02/2016 Imaging   Normal LV size with EF 55%. Basal inferior and basal inferoseptal hypokinesis. Normal RV size and systolic function. No significant valvular abnormalities.   12/05/2016 Tumor Marker   Patient's tumor was tested for the following markers: CA125 Results of the tumor marker test revealed 49.1   01/09/2017 Tumor Marker   Patient's tumor was tested for the following markers: CA125 Results of the tumor marker test revealed 47.2   02/06/2017 Tumor Marker   Patient's tumor was tested for the following markers: CA125 Results of the tumor marker test revealed 44.1   02/18/2017 Imaging   Stable mild peritoneal thickening. No new or progressive disease identified within the abdomen or pelvis.  Stable tiny sub-cm left external iliac and mesenteric lymph nodes.  Stable hepatic cirrhosis, and splenomegaly consistent with portal venous hypertension. No evidence of hepatic neoplasm.  Colonic diverticulosis, without radiographic evidence of diverticulitis.   03/06/2017 Tumor Marker  Patient's tumor was tested for the following markers: CA125 Results of the tumor marker test revealed 50.4   03/17/2017 Imaging   - Left ventricle: The cavity size was normal. Wall thickness was normal. Systolic function was normal. The estimated ejection fraction was in the range of 55% to 60%. Wall motion was normal; there were no regional wall motion abnormalities. Features are consistent with a pseudonormal left ventricular filling pattern, with concomitant abnormal relaxation and increased filling pressure (grade 2 diastolic  dysfunction). - Mitral valve: There was mild regurgitation. - Left atrium: The atrium was mildly dilated   04/03/2017 Tumor Marker   Patient's tumor was tested for the following markers: CA125 Results of the tumor marker test revealed 47.2   05/14/2017 Imaging   CT scan of abdomen and pelvis Stable mild peritoneal thickening. No new or progressive disease within the abdomen or pelvis.  Stable hepatic cirrhosis. Stable splenomegaly, consistent with portal venous hypertension. No evidence of hepatic neoplasm.   05/15/2017 Tumor Marker   Patient's tumor was tested for the following markers: CA125 Results of the tumor marker test revealed 56.1   06/12/2017 Tumor Marker   Patient's tumor was tested for the following markers: CA125 Results of the tumor marker test revealed 49.2   07/10/2017 Tumor Marker   Patient's tumor was tested for the following markers: CA125 Results of the tumor marker test revealed 46.3   08/07/2017 Tumor Marker   Patient's tumor was tested for the following markers: CA125 Results of the tumor marker test revealed 52.9   08/20/2017 Imaging   1. Mild omental/peritoneal haziness, unchanged. No evidence of new metastatic disease. 2. Cirrhosis with splenomegaly.   09/04/2017 Tumor Marker   Patient's tumor was tested for the following markers: CA125 Results of the tumor marker test revealed 48.5   11/13/2017 Tumor Marker   Patient's tumor was tested for the following markers: CA125 Results of the tumor marker test revealed 55.8   12/17/2017 Imaging   1. No findings to suggest metastatic disease in the abdomen or pelvis. 2. Severe hepatic cirrhosis with evidence of portal hypertension, as demonstrated by dilated portal vein, splenomegaly and portosystemic collateral pathways, including gastric and esophageal varices.  3. Colonic diverticulosis without evidence of acute diverticulitis at this time. 4. Additional incidental findings, as above.   01/16/2018 Tumor Marker    Patient's tumor was tested for the following markers: CA125 Results of the tumor marker test revealed 63.7   02/10/2018 Procedure   EGD - Recently bleeding grade III and large (> 5 mm) esophageal varices. Completely eradicated. Banded. - Portal hypertensive gastropathy. - Normal examined duodenum. - No specimens collected   02/13/2018 Tumor Marker   Patient's tumor was tested for the following markers: CA125 Results of the tumor marker test revealed 82.7   02/25/2018 Imaging   Bone density showed mild osteopenia   04/08/2018 Tumor Marker   Patient's tumor was tested for the following markers: CA125 Results of the tumor marker test revealed 82.7   04/08/2018 Imaging   1. No definite omental or peritoneal surface lesions. However, there are 2 slowly enlarging lymph nodes noted. One is in the small bowel mesentery and the other is in the left deep pelvis. Could not exclude recurrent disease. 2. Stable advanced cirrhotic changes involving the liver with portal venous hypertension, portal venous collaterals, esophageal varices and splenomegaly. No worrisome hepatic lesions. 3. Diffuse wall thickening of the colon could suggest diffuse colitis or could be due to low albumin. Recommend correlation with any symptoms  such as diarrhea.   05/21/2018 Tumor Marker   Patient's tumor was tested for the following markers: CA125 Results of the tumor marker test revealed 86   09/07/2018 Tumor Marker   Patient's tumor was tested for the following markers:CA-125 Results of the tumor marker test revealed 111   09/29/2018 Tumor Marker   Patient's tumor was tested for the following markers: CA-125 Results of the tumor marker test revealed 121.   09/29/2018 - 11/24/2018 Chemotherapy   The patient had carboplatin and gemzar for chemotherapy treatment.  Chemo was stopped due to allergic reaction to carboplatin   10/27/2018 Tumor Marker   Patient's tumor was tested for the following markers: CA-125 Results  of the tumor marker test revealed 99.9   11/24/2018 Tumor Marker   Patient's tumor was tested for the following markers: CA-125 Results of the tumor marker test revealed 81.2   12/14/2018 Tumor Marker   Patient's tumor was tested for the following markers: CA-125 Results of the tumor marker test revealed 81.2   12/14/2018 Imaging   1. Mild improvement in peritoneal disease. Peritoneal nodule within the left posterior pelvis contiguous with the left side of vaginal cuff is slightly decreased in size in the interval. Within the right iliac fossa there is a small peritoneal nodule which has decreased in size in the interval. Central small bowel mesenteric lymph node is not significantly changed. Slight decrease in size left periaortic lymph node.    2. Morphologic features of the liver compatible with cirrhosis. Splenomegaly is noted which may reflect portal venous hypertension.   12/21/2018 Tumor Marker   Patient's tumor was tested for the following markers: CA-125 Results of the tumor marker test revealed 64.2   12/21/2018 -  Chemotherapy   The patient had taxotere for chemotherapy treatment.       REVIEW OF SYSTEMS:   Constitutional: Denies fevers, chills or abnormal weight loss Eyes: Denies blurriness of vision Ears, nose, mouth, throat, and face: Denies mucositis or sore throat Respiratory: Denies cough, dyspnea or wheezes Cardiovascular: Denies palpitation, chest discomfort or lower extremity swelling Gastrointestinal:  Denies nausea, heartburn or change in bowel habits Skin: Denies abnormal skin rashes Lymphatics: Denies new lymphadenopathy or easy bruising Neurological:Denies numbness, tingling or new weaknesses Behavioral/Psych: Mood is stable, no new changes  All other systems were reviewed with the patient and are negative.  I have reviewed the past medical history, past surgical history, social history and family history with the patient and they are unchanged from previous  note.  ALLERGIES:  is allergic to carboplatin; shellfish allergy; vancomycin; benadryl [diphenhydramine hcl]; penicillins; and tape.  MEDICATIONS:  Current Outpatient Medications  Medication Sig Dispense Refill  . cholecalciferol (VITAMIN D) 1000 units tablet Take 1,000 Units by mouth at bedtime.     Marland Kitchen dexamethasone (DECADRON) 4 MG tablet Start the day before Taxotere, take 1 tablet in the morning, and then 1 tablet daily after chemo for 2 days. Pls dispense 30 tabs 30 tablet 1  . empagliflozin (JARDIANCE) 10 MG TABS tablet Take 10 mg by mouth daily.    Marland Kitchen gabapentin (NEURONTIN) 300 MG capsule TAKE 2 CAPSULES(600 MG) BY MOUTH TWICE DAILY (Patient taking differently: Take 600 mg by mouth 2 (two) times daily. ) 120 capsule 11  . HYDROcodone-acetaminophen (NORCO/VICODIN) 5-325 MG tablet Take 1 tablet by mouth every 6 (six) hours as needed for moderate pain. 60 tablet 0  . insulin degludec (TRESIBA FLEXTOUCH) 100 UNIT/ML SOPN FlexTouch Pen Inject 36 Units into the skin daily.     Marland Kitchen  lidocaine-prilocaine (EMLA) cream Apply to Porta-cath  1-2 hours prior to access as directed. (Patient taking differently: Apply 1 application topically daily as needed (port access). ) 30 g 9  . nadolol (CORGARD) 20 MG tablet Take 1 tablet (20 mg total) by mouth daily. (Patient taking differently: Take 20 mg by mouth at bedtime. ) 90 tablet 3  . ondansetron (ZOFRAN) 8 MG tablet Take 1 tablet (8 mg total) by mouth every 8 (eight) hours as needed. Start on the third day after chemotherapy. 30 tablet 1  . pantoprazole (PROTONIX) 40 MG tablet Take 1 tablet (40 mg total) by mouth daily.    . prochlorperazine (COMPAZINE) 10 MG tablet Take 1 tablet (10 mg total) by mouth every 6 (six) hours as needed (Nausea or vomiting). 30 tablet 1  . rotigotine (NEUPRO) 4 MG/24HR APPLY 1 PATCH EXTERNALLY TO THE SKIN DAILY (Patient taking differently: Place 1 patch onto the skin daily. ) 30 patch 5   No current facility-administered medications  for this visit.     PHYSICAL EXAMINATION: ECOG PERFORMANCE STATUS: 1 - Symptomatic but completely ambulatory  Vitals:   01/19/19 1310  BP: 139/90  Pulse: 72  Resp: 18  Temp: 98.2 F (36.8 C)  SpO2: 100%   Filed Weights   01/19/19 1310  Weight: 184 lb (83.5 kg)    GENERAL:alert, no distress and comfortable SKIN: skin color, texture, turgor are normal, no rashes or significant lesions EYES: normal, Conjunctiva are pink and non-injected, sclera clear OROPHARYNX:no exudate, no erythema and lips, buccal mucosa, and tongue normal  NECK: supple, thyroid normal size, non-tender, without nodularity LYMPH:  no palpable lymphadenopathy in the cervical, axillary or inguinal LUNGS: clear to auscultation and percussion with normal breathing effort HEART: regular rate & rhythm and no murmurs and no lower extremity edema ABDOMEN:abdomen soft, non-tender and normal bowel sounds Musculoskeletal:no cyanosis of digits and no clubbing.  Her right upper extremity swelling is improving NEURO: alert & oriented x 3 with fluent speech, no focal motor/sensory deficits  LABORATORY DATA:  I have reviewed the data as listed    Component Value Date/Time   NA 142 01/19/2019 1235   NA 142 02/06/2017 0742   K 3.6 01/19/2019 1235   K 3.9 02/06/2017 0742   CL 106 01/19/2019 1235   CO2 26 01/19/2019 1235   CO2 22 02/06/2017 0742   GLUCOSE 151 (H) 01/19/2019 1235   GLUCOSE 156 (H) 02/06/2017 0742   BUN 23 01/19/2019 1235   BUN 13.5 02/06/2017 0742   CREATININE 0.98 01/19/2019 1235   CREATININE 0.8 02/06/2017 0742   CALCIUM 8.9 01/19/2019 1235   CALCIUM 8.5 02/06/2017 0742   PROT 8.3 (H) 01/19/2019 1235   PROT 6.9 02/06/2017 0742   ALBUMIN 3.5 01/19/2019 1235   ALBUMIN 3.7 02/06/2017 0742   AST 22 01/19/2019 1235   AST 47 (H) 02/06/2017 0742   ALT 17 01/19/2019 1235   ALT 54 02/06/2017 0742   ALKPHOS 118 01/19/2019 1235   ALKPHOS 130 02/06/2017 0742   BILITOT 0.8 01/19/2019 1235   BILITOT 0.65  02/06/2017 0742   GFRNONAA 58 (L) 01/19/2019 1235   GFRAA >60 01/19/2019 1235    No results found for: SPEP, UPEP  Lab Results  Component Value Date   WBC 4.9 01/19/2019   NEUTROABS 3.3 01/19/2019   HGB 9.7 (L) 01/19/2019   HCT 30.8 (L) 01/19/2019   MCV 97.8 01/19/2019   PLT 117 (L) 01/19/2019      Chemistry  Component Value Date/Time   NA 142 01/19/2019 1235   NA 142 02/06/2017 0742   K 3.6 01/19/2019 1235   K 3.9 02/06/2017 0742   CL 106 01/19/2019 1235   CO2 26 01/19/2019 1235   CO2 22 02/06/2017 0742   BUN 23 01/19/2019 1235   BUN 13.5 02/06/2017 0742   CREATININE 0.98 01/19/2019 1235   CREATININE 0.8 02/06/2017 0742      Component Value Date/Time   CALCIUM 8.9 01/19/2019 1235   CALCIUM 8.5 02/06/2017 0742   ALKPHOS 118 01/19/2019 1235   ALKPHOS 130 02/06/2017 0742   AST 22 01/19/2019 1235   AST 47 (H) 02/06/2017 0742   ALT 17 01/19/2019 1235   ALT 54 02/06/2017 0742   BILITOT 0.8 01/19/2019 1235   BILITOT 0.65 02/06/2017 0742       RADIOGRAPHIC STUDIES: I have personally reviewed the radiological images as listed and agreed with the findings in the report. Dg Lumbar Spine Complete  Result Date: 12/30/2018 CLINICAL DATA:  Recent fall with low back pain, initial encounter EXAM: LUMBAR SPINE - COMPLETE 4+ VIEW COMPARISON:  None. FINDINGS: Five lumbar type vertebral bodies are well visualized. Osteophytic changes are noted worst at L2-3 and L4-5. Facet hypertrophic changes are noted. No anterolisthesis is seen. No soft tissue abnormality is noted. IMPRESSION: Mild degenerative change without acute abnormality. Electronically Signed   By: Inez Catalina M.D.   On: 12/30/2018 08:51   Ct Head Wo Contrast  Result Date: 12/30/2018 CLINICAL DATA:  Follow-up subdural hemorrhage. EXAM: CT HEAD WITHOUT CONTRAST TECHNIQUE: Contiguous axial images were obtained from the base of the skull through the vertex without intravenous contrast. COMPARISON:  12/30/2018  FINDINGS: Brain: Small volume parafalcine subdural hematoma measures up to 3 mm in thickness, unchanged. Minimal subdural blood along the right tentorium is also unchanged. No convincing subarachnoid hemorrhage is identified within limitations of mild motion artifact. No acute infarct, mass, or midline shift is evident. The ventricles and sulci are normal in size. Vascular: Calcified atherosclerosis at the skull base. Skull: No fracture or focal osseous lesion. Sinuses/Orbits: Mild, partially polypoid right sphenoid sinus mucosal thickening. Clear mastoid air cells. Bilateral cataract extraction. Other: Right-sided scalp skin staples. IMPRESSION: 1. Unchanged small parafalcine and right tentorial subdural hematomas. 2. No evidence of new intracranial abnormality. Electronically Signed   By: Logan Bores M.D.   On: 12/30/2018 18:10   Ct Head Wo Contrast  Result Date: 12/30/2018 CLINICAL DATA:  Pain following fall.  History of ovarian carcinoma EXAM: CT HEAD WITHOUT CONTRAST CT CERVICAL SPINE WITHOUT CONTRAST TECHNIQUE: Multidetector CT imaging of the head and cervical spine was performed following the standard protocol without intravenous contrast. Multiplanar CT image reconstructions of the cervical spine were also generated. COMPARISON:  None. FINDINGS: CT HEAD FINDINGS Brain: The ventricles are normal in size and configuration. There is a small amount of hemorrhage in the anterior to mid falx consistent with a small falcine subdural hematoma. There is no appreciable mass effect in this area. A small amount of subarachnoid hemorrhage is noted in the left paramedian frontal region near the falcine subdural hematoma. There is equivocal small amount of right frontal subarachnoid hemorrhage as well, not causing mass effect. No other foci of hemorrhage identified. No midline shift. Beyond the mild hemorrhage within the falx, there is no other extra-axial fluid collection. No findings indicative of focal infarct. No  intracranial edema evident. Vascular: No hyperdense vessel. There are foci of arterial vascular calcification in the carotid siphon regions.  Skull: The bony calvarium appears intact. Sinuses/Orbits: Visualized paranasal sinuses are clear. There is mild rightward deviation of the nasal septum. Orbits appear symmetric bilaterally. Other: Mastoid air cells are clear. CT CERVICAL SPINE FINDINGS Alignment: There is no appreciable spondylolisthesis. Skull base and vertebrae: Skull base and craniocervical junction regions appear normal. No fractures evident. Bones are somewhat osteoporotic. No blastic or lytic bone lesions. Soft tissues and spinal canal: Prevertebral soft tissues and predental space regions are normal. There is no evident cord or canal hematoma. No paraspinous lesions are demonstrable. Disc levels: There is moderate disc space narrowing at C5-6 and C6-7. There are anterior osteophytes at C5, C6, and C7. There is facet hypertrophy at multiple levels, most severe at C3-4 on the right. There is exit foraminal narrowing due to bony hypertrophy at C5-6 and C6-7 bilaterally. No frank disc extrusion or stenosis. Upper chest: Visualized upper lung regions are clear. Other: None IMPRESSION: Head CT: 1. Small amount of falcine subdural hematoma without appreciable mass effect. Thickness of the hemorrhage within this area measures 3 mm. No other extra-axial fluid. 2. Small amount of subarachnoid hemorrhage in the right frontal and paramedian left frontal lobe regions without mass effect or edema. 3.  Elsewhere brain parenchyma appears unremarkable. 4.  There are foci of arterial vascular calcification. CT cervical spine: 1.  No fracture or spondylolisthesis. 2. Osteoarthritic change at several levels. No disc extrusion or stenosis evident. 3.  No blastic or lytic bone lesions.  Bone somewhat osteoporotic. Critical Value/emergent results were called by telephone at the time of interpretation on 12/30/2018 at 8:27 am  to providerDr. Tomi Bamberger, ED physician, who verbally acknowledged these results. Electronically Signed   By: Lowella Grip III M.D.   On: 12/30/2018 08:27   Ct Cervical Spine Wo Contrast  Result Date: 12/30/2018 CLINICAL DATA:  Pain following fall.  History of ovarian carcinoma EXAM: CT HEAD WITHOUT CONTRAST CT CERVICAL SPINE WITHOUT CONTRAST TECHNIQUE: Multidetector CT imaging of the head and cervical spine was performed following the standard protocol without intravenous contrast. Multiplanar CT image reconstructions of the cervical spine were also generated. COMPARISON:  None. FINDINGS: CT HEAD FINDINGS Brain: The ventricles are normal in size and configuration. There is a small amount of hemorrhage in the anterior to mid falx consistent with a small falcine subdural hematoma. There is no appreciable mass effect in this area. A small amount of subarachnoid hemorrhage is noted in the left paramedian frontal region near the falcine subdural hematoma. There is equivocal small amount of right frontal subarachnoid hemorrhage as well, not causing mass effect. No other foci of hemorrhage identified. No midline shift. Beyond the mild hemorrhage within the falx, there is no other extra-axial fluid collection. No findings indicative of focal infarct. No intracranial edema evident. Vascular: No hyperdense vessel. There are foci of arterial vascular calcification in the carotid siphon regions. Skull: The bony calvarium appears intact. Sinuses/Orbits: Visualized paranasal sinuses are clear. There is mild rightward deviation of the nasal septum. Orbits appear symmetric bilaterally. Other: Mastoid air cells are clear. CT CERVICAL SPINE FINDINGS Alignment: There is no appreciable spondylolisthesis. Skull base and vertebrae: Skull base and craniocervical junction regions appear normal. No fractures evident. Bones are somewhat osteoporotic. No blastic or lytic bone lesions. Soft tissues and spinal canal: Prevertebral soft  tissues and predental space regions are normal. There is no evident cord or canal hematoma. No paraspinous lesions are demonstrable. Disc levels: There is moderate disc space narrowing at C5-6 and C6-7. There are anterior osteophytes  at C5, C6, and C7. There is facet hypertrophy at multiple levels, most severe at C3-4 on the right. There is exit foraminal narrowing due to bony hypertrophy at C5-6 and C6-7 bilaterally. No frank disc extrusion or stenosis. Upper chest: Visualized upper lung regions are clear. Other: None IMPRESSION: Head CT: 1. Small amount of falcine subdural hematoma without appreciable mass effect. Thickness of the hemorrhage within this area measures 3 mm. No other extra-axial fluid. 2. Small amount of subarachnoid hemorrhage in the right frontal and paramedian left frontal lobe regions without mass effect or edema. 3.  Elsewhere brain parenchyma appears unremarkable. 4.  There are foci of arterial vascular calcification. CT cervical spine: 1.  No fracture or spondylolisthesis. 2. Osteoarthritic change at several levels. No disc extrusion or stenosis evident. 3.  No blastic or lytic bone lesions.  Bone somewhat osteoporotic. Critical Value/emergent results were called by telephone at the time of interpretation on 12/30/2018 at 8:27 am to providerDr. Tomi Bamberger, ED physician, who verbally acknowledged these results. Electronically Signed   By: Lowella Grip III M.D.   On: 12/30/2018 08:27   Dg Chest Port 1 View  Result Date: 12/30/2018 CLINICAL DATA:  Fever EXAM: PORTABLE CHEST 1 VIEW COMPARISON:  2015 FINDINGS: Right chest wall port catheter tip overlies the central SVC. Lungs are clear. No pleural effusion. Cardiomediastinal contours are within normal limits with normal heart size. IMPRESSION: No active disease. Electronically Signed   By: Macy Mis M.D.   On: 12/30/2018 07:27   Vas [place] Upper Extremity Venous Duplex  Result Date: 12/31/2018 UPPER VENOUS STUDY  Indications: Erythema,  Edema, and Pain status post IV Vancomycin Limitations: Interstitial edema and pain with touch. Comparison Study: No prior study on file for comparison Performing Technologist: Sharion Dove RVS  Examination Guidelines: A complete evaluation includes B-mode imaging, spectral Doppler, color Doppler, and power Doppler as needed of all accessible portions of each vessel. Bilateral testing is considered an integral part of a complete examination. Limited examinations for reoccurring indications may be performed as noted.  Right Findings: +----------+------------+---------+-----------+----------+---------------+ RIGHT     CompressiblePhasicitySpontaneousProperties    Summary     +----------+------------+---------+-----------+----------+---------------+ IJV           Full       Yes       Yes                              +----------+------------+---------+-----------+----------+---------------+ Subclavian    Full       Yes       Yes                              +----------+------------+---------+-----------+----------+---------------+ Axillary      Full       Yes       Yes                              +----------+------------+---------+-----------+----------+---------------+ Brachial      Full       Yes       Yes                              +----------+------------+---------+-----------+----------+---------------+ Radial  patent by color +----------+------------+---------+-----------+----------+---------------+ Ulnar                                               patent by color +----------+------------+---------+-----------+----------+---------------+ Cephalic      Full                                                  +----------+------------+---------+-----------+----------+---------------+ Basilic       Full                                                   +----------+------------+---------+-----------+----------+---------------+  Left Findings: +----------+------------+---------+-----------+----------+-------+ LEFT      CompressiblePhasicitySpontaneousPropertiesSummary +----------+------------+---------+-----------+----------+-------+ Subclavian               Yes       Yes                      +----------+------------+---------+-----------+----------+-------+  Summary:  Right: No evidence of deep vein thrombosis in the upper extremity. No evidence of superficial vein thrombosis in the upper extremity.  Left: No evidence of thrombosis in the subclavian.  *See table(s) above for measurements and observations.  Diagnosing physician: Monica Martinez MD Electronically signed by Monica Martinez MD on 12/31/2018 at 7:11:02 PM.    Final     All questions were answered. The patient knows to call the clinic with any problems, questions or concerns. No barriers to learning was detected.  I spent 25 minutes counseling the patient face to face. The total time spent in the appointment was 30 minutes and more than 50% was on counseling and review of test results  [name], MD 01/20/2019 8:14 AM

## 2019-01-20 NOTE — Assessment & Plan Note (Signed)
Liver function is stable Observe closely

## 2019-01-20 NOTE — Assessment & Plan Note (Signed)
She is recovered from recent hospitalization I plan to reduce dose of Taxotere to 50%, along with G-CSF support to avoid sepsis The patient and her husband is educated to watch for signs and symptoms of neutropenic fever

## 2019-01-20 NOTE — Assessment & Plan Note (Signed)
She has severe, persistent pancytopenia despite recent dose adjustment I plan to proceed with treatment today with 50% chemo dose adjustment She is not symptomatic and does not need transfusion support

## 2019-01-21 ENCOUNTER — Telehealth: Payer: Self-pay | Admitting: Hematology and Oncology

## 2019-01-21 NOTE — Telephone Encounter (Signed)
Scheduled appt per 12/8 sch message - pt aware of appt date and time

## 2019-01-29 NOTE — Progress Notes (Signed)
Virtual Visit via Video Note (last 1 min on phone as we got cut off and could not reconnect via video) The purpose of this virtual visit is to provide medical care while limiting exposure to the novel coronavirus.    Consent was obtained for video visit:  Yes.   Answered questions that patient had about telehealth interaction:  Yes.   I discussed the limitations, risks, security and privacy concerns of performing an evaluation and management service by telemedicine. I also discussed with the patient that there may be a patient responsible charge related to this service. The patient expressed understanding and agreed to proceed.  Pt location: Home Physician Location: office Name of referring provider:  Helane Rima, MD I connected with Olivia Benson at patients initiation/request on 02/01/2019 at  8:15 AM EST by video enabled telemedicine application and verified that I am speaking with the correct person using two identifiers. Pt MRN:  [id number] Pt DOB:  1945-03-16 Video Participants:  Olivia Benson;     History of Present Illness:  Patient seen today in follow-up for restless leg which has been secondary to iron deficiency anemia.  She is on the rotigotine patch, 4 mg daily. No rashes, skin reactions.   Medical records have been reviewed since last visit with took 32 min of non face to face time.  She was in the hospital in November for neutropenic fever, and also a fall which revealed a subdural hematoma and subarachnoid hemorrhage.  She does not know how she fell - cannot remember the fall but head did hit the window sill.    I personally reviewed the images.  There was a very small falcine subdural hematoma (3 mm) and a small amount of subarachnoid blood in the right frontal and left frontal regions.  She saw neurosurgery according to the hospitalist notes, although I did not see neurosurgery notes present.  Regardless, she did not need to have surgery.  She did get platelet transfusion,  FFP and vitamin K during the hospitalization.   She is much better.  She does state that when she fell she must have really fallen hard as she has buttocks pain that will radiate to the right leg.  It is getting better - "I can get up and down out of a chair without help."   Since discharge from the hospital, the patient has followed up with Dr. Alvy Bimler on December 8.  Dose of chemotherapy was adjusted to avoid sepsis and severe, persistent pancytopenia.     Current Outpatient Medications on File Prior to Visit  Medication Sig Dispense Refill  . cholecalciferol (VITAMIN D) 1000 units tablet Take 1,000 Units by mouth at bedtime.     Marland Kitchen dexamethasone (DECADRON) 4 MG tablet Start the day before Taxotere, take 1 tablet in the morning, and then 1 tablet daily after chemo for 2 days. Pls dispense 30 tabs 30 tablet 1  . empagliflozin (JARDIANCE) 10 MG TABS tablet Take 10 mg by mouth daily.    Marland Kitchen gabapentin (NEURONTIN) 300 MG capsule TAKE 2 CAPSULES(600 MG) BY MOUTH TWICE DAILY (Patient taking differently: Take 600 mg by mouth 2 (two) times daily. ) 120 capsule 11  . HYDROcodone-acetaminophen (NORCO/VICODIN) 5-325 MG tablet Take 1 tablet by mouth every 6 (six) hours as needed for moderate pain. 60 tablet 0  . insulin degludec (TRESIBA FLEXTOUCH) 100 UNIT/ML SOPN FlexTouch Pen Inject 36 Units into the skin daily.     Marland Kitchen lidocaine-prilocaine (EMLA) cream Apply to [employer]  1-2 hours prior to access as directed. (Patient taking differently: Apply 1 application topically daily as needed (port access). ) 30 g 9  . nadolol (CORGARD) 20 MG tablet Take 1 tablet (20 mg total) by mouth daily. (Patient taking differently: Take 20 mg by mouth at bedtime. ) 90 tablet 3  . ondansetron (ZOFRAN) 8 MG tablet Take 1 tablet (8 mg total) by mouth every 8 (eight) hours as needed. Start on the third day after chemotherapy. 30 tablet 1  . pantoprazole (PROTONIX) 40 MG tablet Take 1 tablet (40 mg total) by mouth daily.    .  prochlorperazine (COMPAZINE) 10 MG tablet Take 1 tablet (10 mg total) by mouth every 6 (six) hours as needed (Nausea or vomiting). 30 tablet 1  . magic mouthwash w/lidocaine SOLN SWISH 5 ML AND SPIT Q 4 H PRN     No current facility-administered medications on file prior to visit.     Observations/Objective:   Vitals:   02/01/19 0800  Weight: 184 lb (83.5 kg)  Height: 5' 3"  (1.6 m)   GEN:  The patient appears stated age and is in NAD.  Neurological examination:  Orientation: The patient is alert and oriented x3. Cranial nerves: There is good facial symmetry. There is no facial hypomimia.  The speech is fluent and clear.  Motor: Strength is at least antigravity x 4.   Shoulder shrug is equal and symmetric.  There is no pronator drift.  Movement examination: Tone: unable Abnormal movements: None seen     Chemistry      Component Value Date/Time   NA 142 01/19/2019 1235   NA 142 02/06/2017 0742   K 3.6 01/19/2019 1235   K 3.9 02/06/2017 0742   CL 106 01/19/2019 1235   CO2 26 01/19/2019 1235   CO2 22 02/06/2017 0742   BUN 23 01/19/2019 1235   BUN 13.5 02/06/2017 0742   CREATININE 0.98 01/19/2019 1235   CREATININE 0.8 02/06/2017 0742      Component Value Date/Time   CALCIUM 8.9 01/19/2019 1235   CALCIUM 8.5 02/06/2017 0742   ALKPHOS 118 01/19/2019 1235   ALKPHOS 130 02/06/2017 0742   AST 22 01/19/2019 1235   AST 47 (H) 02/06/2017 0742   ALT 17 01/19/2019 1235   ALT 54 02/06/2017 0742   BILITOT 0.8 01/19/2019 1235   BILITOT 0.65 02/06/2017 0742     Lab Results  Component Value Date   WBC 4.9 01/19/2019   HGB 9.7 (L) 01/19/2019   HCT 30.8 (L) 01/19/2019   MCV 97.8 01/19/2019   PLT 117 (L) 01/19/2019   Lab Results  Component Value Date   FERRITIN 152 11/24/2018     Assessment and Plan:   1.  Restless leg secondary to iron deficiency anemia  -On rotigotine patch, 4 mg daily.  No compulsive behaviors or sleep attacks.  -last ferritin very good.  Patient  states that she can tell the ferritin is good because she is really not craving ice. 2.  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 Alvy Bimler is adjusting chemotherapy dose 4.  Small SDH in 12/2018 after fall  -was hospitalized and seen by neurosx  -Sounds like fall caused some low back pain that radiated down her R leg.  She is slowly getting better.  Offered physical therapy but declined.  If this does not get better, she needs to go back and see Dr. Saintclair Halsted, who she saw in the  hospital for the subdural.  Follow Up Instructions:  9 months  -I discussed the assessment and treatment plan with the patient. The patient was provided an opportunity to ask questions and all were answered. The patient agreed with the plan and demonstrated an understanding of the instructions.   The patient was advised to call back or seek an in-person evaluation if the symptoms worsen or if the condition fails to improve as anticipated.    Total Time spent in visit with the patient was:  15 min, of which more than 50% of the time was spent in counseling.   Pt understands and agrees with the plan of care outlined.  This did not include the 32 min of record review which was detailed above, which was non face to face time.    Alonza Bogus, DO

## 2019-02-01 ENCOUNTER — Telehealth (INDEPENDENT_AMBULATORY_CARE_PROVIDER_SITE_OTHER): Payer: Medicare Other | Admitting: Neurology

## 2019-02-01 ENCOUNTER — Other Ambulatory Visit: Payer: Self-pay

## 2019-02-01 ENCOUNTER — Encounter: Payer: Self-pay | Admitting: Neurology

## 2019-02-01 DIAGNOSIS — S065X9A Traumatic subdural hemorrhage with loss of consciousness of unspecified duration, initial encounter: Secondary | ICD-10-CM | POA: Diagnosis not present

## 2019-02-01 DIAGNOSIS — S065XAA Traumatic subdural hemorrhage with loss of consciousness status unknown, initial encounter: Secondary | ICD-10-CM

## 2019-02-01 MED ORDER — NEUPRO 4 MG/24HR TD PT24: 1.0000 | MEDICATED_PATCH | Freq: Every day | TRANSDERMAL | 7 refills | Status: DC

## 2019-02-09 ENCOUNTER — Inpatient Hospital Stay: Payer: Medicare Other

## 2019-02-09 ENCOUNTER — Other Ambulatory Visit: Payer: Self-pay | Admitting: Gastroenterology

## 2019-02-09 ENCOUNTER — Telehealth: Payer: Self-pay | Admitting: Hematology and Oncology

## 2019-02-09 ENCOUNTER — Other Ambulatory Visit: Payer: Self-pay

## 2019-02-09 ENCOUNTER — Inpatient Hospital Stay (HOSPITAL_BASED_OUTPATIENT_CLINIC_OR_DEPARTMENT_OTHER): Payer: Medicare Other | Admitting: Hematology and Oncology

## 2019-02-09 ENCOUNTER — Encounter: Payer: Self-pay | Admitting: Hematology and Oncology

## 2019-02-09 ENCOUNTER — Other Ambulatory Visit: Payer: Self-pay | Admitting: Hematology and Oncology

## 2019-02-09 DIAGNOSIS — C482 Malignant neoplasm of peritoneum, unspecified: Secondary | ICD-10-CM

## 2019-02-09 DIAGNOSIS — D61818 Other pancytopenia: Secondary | ICD-10-CM | POA: Diagnosis not present

## 2019-02-09 DIAGNOSIS — I10 Essential (primary) hypertension: Secondary | ICD-10-CM

## 2019-02-09 DIAGNOSIS — Z5111 Encounter for antineoplastic chemotherapy: Secondary | ICD-10-CM | POA: Diagnosis not present

## 2019-02-09 DIAGNOSIS — K7581 Nonalcoholic steatohepatitis (NASH): Secondary | ICD-10-CM

## 2019-02-09 DIAGNOSIS — C569 Malignant neoplasm of unspecified ovary: Secondary | ICD-10-CM

## 2019-02-09 DIAGNOSIS — D61810 Antineoplastic chemotherapy induced pancytopenia: Secondary | ICD-10-CM

## 2019-02-09 DIAGNOSIS — Z7189 Other specified counseling: Secondary | ICD-10-CM

## 2019-02-09 DIAGNOSIS — D509 Iron deficiency anemia, unspecified: Secondary | ICD-10-CM

## 2019-02-09 DIAGNOSIS — K7460 Unspecified cirrhosis of liver: Secondary | ICD-10-CM

## 2019-02-09 DIAGNOSIS — T451X5A Adverse effect of antineoplastic and immunosuppressive drugs, initial encounter: Secondary | ICD-10-CM

## 2019-02-09 LAB — CBC WITH DIFFERENTIAL (CANCER CENTER ONLY)
Abs Immature Granulocytes: 0.02 10*3/uL (ref 0.00–0.07)
Basophils Absolute: 0 10*3/uL (ref 0.0–0.1)
Basophils Relative: 1 %
Eosinophils Absolute: 0.1 10*3/uL (ref 0.0–0.5)
Eosinophils Relative: 2 %
HCT: 31.5 % — ABNORMAL LOW (ref 36.0–46.0)
Hemoglobin: 10.2 g/dL — ABNORMAL LOW (ref 12.0–15.0)
Immature Granulocytes: 0 %
Lymphocytes Relative: 23 %
Lymphs Abs: 1.3 10*3/uL (ref 0.7–4.0)
MCH: 31.1 pg (ref 26.0–34.0)
MCHC: 32.4 g/dL (ref 30.0–36.0)
MCV: 96 fL (ref 80.0–100.0)
Monocytes Absolute: 0.3 10*3/uL (ref 0.1–1.0)
Monocytes Relative: 5 %
Neutro Abs: 3.8 10*3/uL (ref 1.7–7.7)
Neutrophils Relative %: 69 %
Platelet Count: 116 10*3/uL — ABNORMAL LOW (ref 150–400)
RBC: 3.28 MIL/uL — ABNORMAL LOW (ref 3.87–5.11)
RDW: 17.3 % — ABNORMAL HIGH (ref 11.5–15.5)
WBC Count: 5.5 10*3/uL (ref 4.0–10.5)
nRBC: 0 % (ref 0.0–0.2)

## 2019-02-09 LAB — CMP (CANCER CENTER ONLY)
ALT: 20 U/L (ref 0–44)
AST: 20 U/L (ref 15–41)
Albumin: 3.6 g/dL (ref 3.5–5.0)
Alkaline Phosphatase: 149 U/L — ABNORMAL HIGH (ref 38–126)
Anion gap: 13 (ref 5–15)
BUN: 21 mg/dL (ref 8–23)
CO2: 22 mmol/L (ref 22–32)
Calcium: 8.4 mg/dL — ABNORMAL LOW (ref 8.9–10.3)
Chloride: 109 mmol/L (ref 98–111)
Creatinine: 0.82 mg/dL (ref 0.44–1.00)
GFR, Est AFR Am: >60 mL/min (ref >60)
GFR, Estimated: >60 mL/min (ref >60)
Glucose, Bld: 132 mg/dL — ABNORMAL HIGH (ref 70–99)
Potassium: 3.4 mmol/L — ABNORMAL LOW (ref 3.5–5.1)
Sodium: 144 mmol/L (ref 135–145)
Total Bilirubin: 0.7 mg/dL (ref 0.3–1.2)
Total Protein: 7.7 g/dL (ref 6.5–8.1)

## 2019-02-09 LAB — FERRITIN: Ferritin: 154 ng/mL (ref 11–307)

## 2019-02-09 LAB — IRON AND TIBC
Iron: 51 ug/dL (ref 41–142)
Saturation Ratios: 17 % — ABNORMAL LOW (ref 21–57)
TIBC: 299 ug/dL (ref 236–444)
UIBC: 248 ug/dL (ref 120–384)

## 2019-02-09 MED ORDER — SODIUM CHLORIDE 0.9% FLUSH
10.0000 mL | INTRAVENOUS | Status: DC | PRN
  Administered 2019-02-09: 10 mL
  Filled 2019-02-09: qty 10

## 2019-02-09 MED ORDER — SODIUM CHLORIDE 0.9 % IV SOLN
Freq: Once | INTRAVENOUS | Status: AC
  Filled 2019-02-09: qty 250

## 2019-02-09 MED ORDER — PEGFILGRASTIM 6 MG/0.6ML ~~LOC~~ PSKT
PREFILLED_SYRINGE | SUBCUTANEOUS | Status: AC
  Filled 2019-02-09: qty 0.6

## 2019-02-09 MED ORDER — HEPARIN SOD (PORK) LOCK FLUSH 100 UNIT/ML IV SOLN
500.0000 [IU] | Freq: Once | INTRAVENOUS | Status: AC | PRN
  Administered 2019-02-09: 500 [IU]
  Filled 2019-02-09: qty 5

## 2019-02-09 MED ORDER — SODIUM CHLORIDE 0.9% FLUSH
10.0000 mL | Freq: Once | INTRAVENOUS | Status: AC
  Administered 2019-02-09: 10 mL
  Filled 2019-02-09: qty 10

## 2019-02-09 MED ORDER — DEXAMETHASONE SODIUM PHOSPHATE 10 MG/ML IJ SOLN
INTRAMUSCULAR | Status: AC
  Filled 2019-02-09: qty 1

## 2019-02-09 MED ORDER — PEGFILGRASTIM 6 MG/0.6ML ~~LOC~~ PSKT
6.0000 mg | PREFILLED_SYRINGE | Freq: Once | SUBCUTANEOUS | Status: AC
  Administered 2019-02-09: 6 mg via SUBCUTANEOUS

## 2019-02-09 MED ORDER — DEXAMETHASONE SODIUM PHOSPHATE 10 MG/ML IJ SOLN
5.0000 mg | Freq: Once | INTRAMUSCULAR | Status: AC
  Administered 2019-02-09: 5 mg via INTRAVENOUS

## 2019-02-09 MED ORDER — SODIUM CHLORIDE 0.9 % IV SOLN
37.5000 mg/m2 | Freq: Once | INTRAVENOUS | Status: AC
  Administered 2019-02-09: 70 mg via INTRAVENOUS
  Filled 2019-02-09: qty 7

## 2019-02-09 NOTE — Assessment & Plan Note (Signed)
She is doing very well with recent 50% dose adjustment She remains mildly pancytopenic I will continue at current dose adjustment along with G-CSF support I recommend we proceed with treatment today and another dose next month before repeat imaging study She agree

## 2019-02-09 NOTE — Telephone Encounter (Signed)
Scheduled appt per 12/29 sch message - pt to get an updated schedule after chemo . Pt RN aware to print out schedule.

## 2019-02-09 NOTE — Patient Instructions (Signed)
[facility] Discharge Instructions for Patients Receiving Chemotherapy  Today you received the following chemotherapy agents: Taxotere  To help prevent nausea and vomiting after your treatment, we encourage you to take your nausea medication as directed.    If you develop nausea and vomiting that is not controlled by your nausea medication, call the clinic.   BELOW ARE SYMPTOMS THAT SHOULD BE REPORTED IMMEDIATELY:  *FEVER GREATER THAN 100.5 F  *CHILLS WITH OR WITHOUT FEVER  NAUSEA AND VOMITING THAT IS NOT CONTROLLED WITH YOUR NAUSEA MEDICATION  *UNUSUAL SHORTNESS OF BREATH  *UNUSUAL BRUISING OR BLEEDING  TENDERNESS IN MOUTH AND THROAT WITH OR WITHOUT PRESENCE OF ULCERS  *URINARY PROBLEMS  *BOWEL PROBLEMS  UNUSUAL RASH Items with * indicate a potential emergency and should be followed up as soon as possible.  Feel free to call the clinic should you have any questions or concerns. The clinic phone number is [phone].  Please show the [facility] at check-in to the Emergency Department and triage nurse.

## 2019-02-09 NOTE — Assessment & Plan Note (Signed)
She has multifactorial pancytopenia, due to recent chemotherapy, liver disease with splenomegaly as well as history of iron deficiency I have repeated iron studies today If she is still iron deficient, we will proceed with intravenous iron infusion

## 2019-02-09 NOTE — Assessment & Plan Note (Signed)
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

## 2019-02-09 NOTE — Progress Notes (Signed)
[place] OFFICE PROGRESS NOTE  Patient Care Team: Gregor Hams, FNP as PCP - General (Family Medicine) Tat, Eustace Quail, DO as Consulting Physician (Neurology)  ASSESSMENT & PLAN:  Primary peritoneal carcinomatosis [facility]) She is doing very well with recent 50% dose adjustment She remains mildly pancytopenic I will continue at current dose adjustment along with G-CSF support I recommend we proceed with treatment today and another dose next month before repeat imaging study She agree  Pancytopenia, acquired [facility]) She has multifactorial pancytopenia, due to recent chemotherapy, liver disease with splenomegaly as well as history of iron deficiency I have repeated iron studies today If she is still iron deficient, we will proceed with intravenous iron infusion  Essential hypertension Her blood pressure is intermittently elevated We will continue to monitor closely  Liver cirrhosis secondary to NASH 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   INTERVAL HISTORY: Please see below for problem oriented charting. 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 Had allergic reaction to carboplatin. Avastin was stopped due to GI hemorrhage   Primary peritoneal carcinomatosis ([place])  09/06/2011 Imaging   CT findings consistent with cirrhotic changes involving the liver.  No worrisome liver mass.  There are associated portal venous collaterals  and splenomegaly consistent with portal venous hypertension. 2.  No other significant upper abdominal findings   11/08/2013 Imaging   US showed ascites   11/08/2013 Initial Diagnosis   Patient had ~ 2 months of some urinary incontinence, saw Dr Ronita Hipps in 10-2013, with pelvic mass on exam    11/12/2013 Imaging   There are findings of progressive hepatic cirrhosis with a large amount of ascites and mild splenomegaly. There are venous collaterals present. 2. There is abnormal thickening of the peritoneal surface along the left mid and lower abdominal wall. There is abnormal soft tissue density in the pelvis as well which could reflect the clinically suspected ovarian malignancy but the findings are nondiagnostic. 3. There is a new small left pleural effusion. 4. There is no acute bowel abnormality.   11/18/2013 Imaging   Successful ultrasound guided diagnostic and therapeutic paracentesis yielding 4.1 liters of ascites.    11/18/2013 Pathology Results   PERITONEAL/ASCITIC FLUID (SPECIMEN 1 OF 1 COLLECTED 11/18/13): SEROUS CARCINOMA, PLEASE SEE COMMENT.   12/01/2013 Tumor Marker   Patient's tumor was tested for the following markers: CA125 Results of the tumor marker test revealed 1938   12/03/2013 Imaging   Successful ultrasound-guided therapeutic paracentesis yielding 3.6 liters of peritoneal fluid.   12/07/2013 - 05/30/2014 Chemotherapy   She received 3 cycles of carboplatin and Taxol, interrupted cycle 4 for surgery.  She subsequently completed 3 more cycles of chemotherapy after surgery   12/20/2013 Imaging   Successful ultrasound-guided diagnostic and therapeutic paracentesis yielding 2.2 liters of peritoneal fluid   12/20/2013 Pathology Results   PERITONEAL/ASCITIC FLUID(SPECIMEN 1 OF 1 COLLECTED 12/20/13): MALIGNANT CELLS CONSISTENT WITH SEROUS CARCINOMA   01/13/2014 Tumor Marker   Patient's tumor was tested for the  following markers: CA125 Results of the tumor marker test revealed  227   02/07/2014 Tumor Marker   Patient's tumor was tested for the following markers: CA125 Results of the tumor marker test revealed 130   02/15/2014 Genetic Testing   Genetics testing from 02-2014 normal ([employer] panel)   02/23/2014 Imaging   Interval decrease in ascites. 2. Morphologic changes in the liver consistent with cirrhosis. 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 Portal vein remains patent at this time. 3. Persistent but decreased abnormal soft tissue attenuation tracking in the omental fat. This may be secondary to interval improvement in metastatic disease. 4. Peritoneal thickening seen in the left abdomen and pelvis on the previous study has decreased and nearly resolved in the interval. This also suggests interval improvement in metastatic disease.    03/07/2014 Procedure   Ultrasound and fluoroscopically guided right internal jugular single lumen power port catheter insertion. Tip in the SVC/RA junction. Catheter ready for use.   03/17/2014 Tumor Marker   Patient's tumor was tested for the following markers: CA125 Results of the tumor marker test revealed 45   03/29/2014 Pathology Results   1. Omentum, resection for tumor - HIGH GRADE SEROUS CARCINOMA, SEE COMMENT. 2. Ovary and fallopian tube, right - HIGH GRADE SEROUS CARCINOMA, SEE COMMENT. 3. Ovary and fallopian tube, left - HIGH GRADE SEROUS CARCINOMA, SEE COMMENT. Diagnosis Note 1. Nests and clusters of malignant cells are invading the omental tissue (part #1) with associated fibrosis. The cells are pleomorphic with prominent nucleoli. There are scattered psammoma bodies. The ovaries are atrophic and exhibit multiple foci of surface based invasive carcinoma and associated fibrosis. The fallopian tubes have a few foci of carcinoma, also superficially located. There are no precursor lesions noted in the ovaries or fallopian tubes (the entire tubes and ovaries were submitted for  evaluation). While there is some retraction artifact, there are several foci suspicious for lymphovascular invasion. 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 Given the fibrosis, there does appear to be a small amount of treatment effect, however, there is abundant residual tumor.    03/29/2014 Surgery   Procedure(s) Performed: 1. Exploratory laparotomy with bilateral salpingo-oophorectomy, omentectomy radical tumor debulking for ovarian cancer .  Surgeon: Thereasa Solo, MD.  Assistant Surgeon: Lahoma Crocker, M.D. Assistant: (an MD assistant was necessary for tissue manipulation, retraction and positioning due to the complexity of the case and hospital policies).  Operative Findings: 10cm omental cake from hepatic to splenic flexure, densely adherent to transverse colon. Milliary studding of tumor implants (<60m, too numerous in number to count) adherent to the mesentery of the small bowel and small bowel wall. Small volume (100cc) ascites. Small ovaries bilaterally, densely adherent to the pelvic cul de sac. Left ovary and tube densely adherent to sigmoid colon. Cirrhotic liver with hepatomegaly and splenomegaly.     This represented an optimal cytoreduction (R1) with visible disease residual on the bowel wall and mesentery (millial, <315mimplants).     04/18/2014 Tumor Marker   Patient's tumor was tested for the following markers: CA125 Results of the tumor marker test revealed 122   05/16/2014 Tumor Marker   Patient's tumor was tested for the following markers: CA125 Results of the tumor marker test revealed 30   06/10/2014 Tumor Marker   Patient's tumor was tested for the following markers: CA125 Results of the tumor marker  test revealed 19   07/04/2014 Imaging   Interval improvement in the appearance of peritoneal metastasis secondary to ovarian  cancer. 2. 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    08/18/2014 Tumor Marker   Patient's tumor was tested for the following markers: CA125 Results of the tumor marker test revealed 16   10/13/2014 Tumor Marker   Patient's tumor was tested for the following markers: CA125 Results of the tumor marker test revealed 14   11/21/2014 Tumor Marker   Patient's tumor was tested for the following markers: CA125 Results of the tumor marker test revealed 16   12/28/2014 Tumor Marker   Patient's tumor was tested for the following markers: CA125 Results of the tumor marker test revealed 762   02/27/2015 Tumor Marker   Patient's tumor was tested for the following markers: CA125 Results of the tumor marker test revealed 20.2   03/22/2015 Imaging   Filler appearance to the prior exam, with very subtle fluid tracking along portions of the liver, and very minimal nodularity along the right paracolic gutter representing residua from the prior peritoneal cancer. The current abnormalities could simply be post therapy findings rather than necessarily representing residual malignancy. No new or enlarging lesions are identified. 2. Hepatic cirrhosis and splenomegaly. There is some gastric varices suggesting portal venous hypertension. 3. Left foraminal stenosis at L4-5 due to spurring. There is likely also some central narrowing of the thecal sac at this level. 4. Bibasilar scarring. 5. Chronic mild left mid kidney scarring   03/27/2015 Tumor Marker   Patient's tumor was tested for the following markers: CA125 Results of the tumor marker test revealed 25.3   04/24/2015 Imaging   Disc bulge L2-3 with mild spinal stenosis and right foraminal encroachment. 2. Disc bulge L3-4 with mild spinal stenosis. 3. Multifactorial spinal, left lateral recess and foraminal stenosis L4-5. There is grade 1 anterolisthesis without evident dynamic instability.   05/29/2015 Tumor Marker   Patient's tumor was  tested for the following markers: CA125 Results of the tumor marker test revealed 29.9   08/21/2015 Tumor Marker   Patient's tumor was tested for the following markers: CA125 Results of the tumor marker test revealed 45   09/18/2015 Tumor Marker   Patient's tumor was tested for the following markers: CA125 Results of the tumor marker test revealed 47   09/27/2015 Imaging   New small bowel mesenteric nodule and enlarged left external iliac lymph node, highly worrisome for metastatic disease. 2. Cirrhosis and splenomegaly   10/10/2015 Tumor Marker   Patient's tumor was tested for the following markers: CA125 Results of the tumor marker test revealed 53.4   10/10/2015 - 12/11/2015 Chemotherapy   She received 4 cycles of carboplatin only   11/20/2015 Tumor Marker   Patient's tumor was tested for the following markers: CA125 Results of the tumor marker test revealed 41.3   12/20/2015 Imaging   CT: Mild left external iliac lymphadenopathy is slightly decreased. Mildly enlarged lower mesenteric nodule is slightly decreased. 2. No new or progressive metastatic disease in the abdomen or pelvis. 3. Cirrhosis. Stable subcentimeter hypodense left liver lobe lesion. No new liver lesions. 4. Stable mild splenomegaly.  No ascites. 5. Mild sigmoid diverticulosis.   01/01/2016 -  Anti-estrogen oral therapy   She was placed on tamoxifen. Had letrozole initially but switched to tamoxifen due to poor tolerance   01/15/2016 Tumor Marker   Patient's tumor was tested for the following markers: CA125 Results of the tumor marker test revealed 31.4  02/19/2016 Tumor Marker   Patient's tumor was tested for the following markers: CA125 Results of the tumor marker test revealed 36.5   05/13/2016 Imaging   No evidence of disease progression within the abdomen or pelvis. Previously noted small central mesenteric nodule and left external iliac node appear slightly smaller. 2. Stable changes of hepatic cirrhosis and  portal hypertension with associated splenomegaly. No new or enlarging hepatic lesions are identified. 3. No acute findings.   05/13/2016 Tumor Marker   Patient's tumor was tested for the following markers: CA125 Results of the tumor marker test revealed 41.2   06/24/2016 Tumor Marker   Patient's tumor was tested for the following markers: CA125 Results of the tumor marker test revealed 47.3   08/20/2016 Imaging   1. The left external iliac lymph node is slightly increased in size compared to the prior exam, currently 1.1 cm and previously 0.9 cm in diameter. 2. Stable central mesenteric lymph node at 0.9 cm in diameter. 3. Stable trace free pelvic fluid and stable slight thickening along the right paracolic gutter, without well-defined peritoneal nodularity. 4. Other imaging findings of potential clinical significance: Subsegmental atelectasis or scarring in the lung bases. Hepatic cirrhosis. Left renal scarring. Mild splenomegaly. Sigmoid colon diverticulosis. Impingement at L4-5 due to spondylosis and degenerative disc disease broad Schmorl' s nodes at L3-L4. Pelvic floor laxity   08/23/2016 Tumor Marker   Patient's tumor was tested for the following markers: CA125 Results of the tumor marker test revealed 80.2   09/04/2016 Imaging   LV EF: 60% -   65%   09/12/2016 Tumor Marker   Patient's tumor was tested for the following markers: CA125 Results of the tumor marker test revealed 98.5   09/12/2016 - 01/29/2018 Chemotherapy   The patient received Doxil and Avastin. Avastin was discontinued in Dec 2019 due to GI hemorrhage   09/26/2016 Tumor Marker   Patient's tumor was tested for the following markers: CA125 Results of the tumor marker test revealed 68.9   10/10/2016 Tumor Marker   Patient's tumor was tested for the following markers: CA125 Results of the tumor marker test revealed 65.6   11/07/2016 Tumor Marker   Patient's tumor was tested for the following markers: CA125 Results of the  tumor marker test revealed 46.4   11/29/2016 Imaging   Stable mild peritoneal thickening, suspicious for peritoneal carcinomatosis. No new or progressive disease identified. No evidence of ascites.  No significant change in 10 mm left external iliac and 8 mm mesenteric lymph nodes.  Stable hepatic cirrhosis, and splenomegaly consistent with portal venous hypertension.  Colonic diverticulosis, without radiographic evidence of diverticulitis.   12/02/2016 Imaging   Normal LV size with EF 55%. Basal inferior and basal inferoseptal hypokinesis. Normal RV size and systolic function. No significant valvular abnormalities.   12/05/2016 Tumor Marker   Patient's tumor was tested for the following markers: CA125 Results of the tumor marker test revealed 49.1   01/09/2017 Tumor Marker   Patient's tumor was tested for the following markers: CA125 Results of the tumor marker test revealed 47.2   02/06/2017 Tumor Marker   Patient's tumor was tested for the following markers: CA125 Results of the tumor marker test revealed 44.1   02/18/2017 Imaging   Stable mild peritoneal thickening. No new or progressive disease identified within the abdomen or pelvis.  Stable tiny sub-cm left external iliac and mesenteric lymph nodes.  Stable hepatic cirrhosis, and splenomegaly consistent with portal venous hypertension. No evidence of hepatic neoplasm.  Colonic diverticulosis,  without radiographic evidence of diverticulitis.   03/06/2017 Tumor Marker   Patient's tumor was tested for the following markers: CA125 Results of the tumor marker test revealed 50.4   03/17/2017 Imaging   - Left ventricle: The cavity size was normal. Wall thickness was normal. Systolic function was normal. The estimated ejection fraction was in the range of 55% to 60%. Wall motion was normal; there were no regional wall motion abnormalities. Features are consistent with a pseudonormal left ventricular filling pattern, with concomitant  abnormal relaxation and increased filling pressure (grade 2 diastolic dysfunction). - Mitral valve: There was mild regurgitation. - Left atrium: The atrium was mildly dilated   04/03/2017 Tumor Marker   Patient's tumor was tested for the following markers: CA125 Results of the tumor marker test revealed 47.2   05/14/2017 Imaging   CT scan of abdomen and pelvis Stable mild peritoneal thickening. No new or progressive disease within the abdomen or pelvis.  Stable hepatic cirrhosis. Stable splenomegaly, consistent with portal venous hypertension. No evidence of hepatic neoplasm.   05/15/2017 Tumor Marker   Patient's tumor was tested for the following markers: CA125 Results of the tumor marker test revealed 56.1   06/12/2017 Tumor Marker   Patient's tumor was tested for the following markers: CA125 Results of the tumor marker test revealed 49.2   07/10/2017 Tumor Marker   Patient's tumor was tested for the following markers: CA125 Results of the tumor marker test revealed 46.3   08/07/2017 Tumor Marker   Patient's tumor was tested for the following markers: CA125 Results of the tumor marker test revealed 52.9   08/20/2017 Imaging   1. Mild omental/peritoneal haziness, unchanged. No evidence of new metastatic disease. 2. Cirrhosis with splenomegaly.   09/04/2017 Tumor Marker   Patient's tumor was tested for the following markers: CA125 Results of the tumor marker test revealed 48.5   11/13/2017 Tumor Marker   Patient's tumor was tested for the following markers: CA125 Results of the tumor marker test revealed 55.8   12/17/2017 Imaging   1. No findings to suggest metastatic disease in the abdomen or pelvis. 2. Severe hepatic cirrhosis with evidence of portal hypertension, as demonstrated by dilated portal vein, splenomegaly and portosystemic collateral pathways, including gastric and esophageal varices.  3. Colonic diverticulosis without evidence of acute diverticulitis at this time. 4.  Additional incidental findings, as above.   01/16/2018 Tumor Marker   Patient's tumor was tested for the following markers: CA125 Results of the tumor marker test revealed 63.7   02/10/2018 Procedure   EGD - Recently bleeding grade III and large (> 5 mm) esophageal varices. Completely eradicated. Banded. - Portal hypertensive gastropathy. - Normal examined duodenum. - No specimens collected   02/13/2018 Tumor Marker   Patient's tumor was tested for the following markers: CA125 Results of the tumor marker test revealed 82.7   02/25/2018 Imaging   Bone density showed mild osteopenia   04/08/2018 Tumor Marker   Patient's tumor was tested for the following markers: CA125 Results of the tumor marker test revealed 82.7   04/08/2018 Imaging   1. No definite omental or peritoneal surface lesions. However, there are 2 slowly enlarging lymph nodes noted. One is in the small bowel mesentery and the other is in the left deep pelvis. Could not exclude recurrent disease. 2. Stable advanced cirrhotic changes involving the liver with portal venous hypertension, portal venous collaterals, esophageal varices and splenomegaly. No worrisome hepatic lesions. 3. Diffuse wall thickening of the colon could suggest diffuse colitis  or could be due to low albumin. Recommend correlation with any symptoms such as diarrhea.   05/21/2018 Tumor Marker   Patient's tumor was tested for the following markers: CA125 Results of the tumor marker test revealed 86   09/07/2018 Tumor Marker   Patient's tumor was tested for the following markers:CA-125 Results of the tumor marker test revealed 111   09/29/2018 Tumor Marker   Patient's tumor was tested for the following markers: CA-125 Results of the tumor marker test revealed 121.   09/29/2018 - 11/24/2018 Chemotherapy   The patient had carboplatin and gemzar for chemotherapy treatment.  Chemo was stopped due to allergic reaction to carboplatin   10/27/2018 Tumor Marker    Patient's tumor was tested for the following markers: CA-125 Results of the tumor marker test revealed 99.9   11/24/2018 Tumor Marker   Patient's tumor was tested for the following markers: CA-125 Results of the tumor marker test revealed 81.2   12/14/2018 Tumor Marker   Patient's tumor was tested for the following markers: CA-125 Results of the tumor marker test revealed 81.2   12/14/2018 Imaging   1. Mild improvement in peritoneal disease. Peritoneal nodule within the left posterior pelvis contiguous with the left side of vaginal cuff is slightly decreased in size in the interval. Within the right iliac fossa there is a small peritoneal nodule which has decreased in size in the interval. Central small bowel mesenteric lymph node is not significantly changed. Slight decrease in size left periaortic lymph node.    2. Morphologic features of the liver compatible with cirrhosis. Splenomegaly is noted which may reflect portal venous hypertension.   12/21/2018 Tumor Marker   Patient's tumor was tested for the following markers: CA-125 Results of the tumor marker test revealed 64.2   12/21/2018 -  Chemotherapy   The patient had taxotere for chemotherapy treatment.       REVIEW OF SYSTEMS:   Constitutional: Denies fevers, chills or abnormal weight loss Eyes: Denies blurriness of vision Ears, nose, mouth, throat, and face: Denies mucositis or sore throat Respiratory: Denies cough, dyspnea or wheezes Cardiovascular: Denies palpitation, chest discomfort or lower extremity swelling Gastrointestinal:  Denies nausea, heartburn or change in bowel habits Skin: Denies abnormal skin rashes Lymphatics: Denies new lymphadenopathy or easy bruising Neurological:Denies numbness, tingling or new weaknesses Behavioral/Psych: Mood is stable, no new changes  All other systems were reviewed with the patient and are negative.  I have reviewed the past medical history, past surgical history, social history  and family history with the patient and they are unchanged from previous note.  ALLERGIES:  is allergic to carboplatin; shellfish allergy; vancomycin; benadryl [diphenhydramine hcl]; penicillins; and tape.  MEDICATIONS:  Current Outpatient Medications  Medication Sig Dispense Refill  . cholecalciferol (VITAMIN D) 1000 units tablet Take 1,000 Units by mouth at bedtime.     Marland Kitchen dexamethasone (DECADRON) 4 MG tablet Start the day before Taxotere, take 1 tablet in the morning, and then 1 tablet daily after chemo for 2 days. Pls dispense 30 tabs 30 tablet 1  . empagliflozin (JARDIANCE) 10 MG TABS tablet Take 10 mg by mouth daily.    Marland Kitchen gabapentin (NEURONTIN) 300 MG capsule TAKE 2 CAPSULES(600 MG) BY MOUTH TWICE DAILY (Patient taking differently: Take 600 mg by mouth 2 (two) times daily. ) 120 capsule 11  . HYDROcodone-acetaminophen (NORCO/VICODIN) 5-325 MG tablet Take 1 tablet by mouth every 6 (six) hours as needed for moderate pain. 60 tablet 0  . insulin degludec (TRESIBA FLEXTOUCH) 100  UNIT/ML SOPN FlexTouch Pen Inject 36 Units into the skin daily.     Marland Kitchen lidocaine-prilocaine (EMLA) cream Apply to Porta-cath  1-2 hours prior to access as directed. (Patient taking differently: Apply 1 application topically daily as needed (port access). ) 30 g 9  . magic mouthwash w/lidocaine SOLN SWISH 5 ML AND SPIT Q 4 H PRN    . nadolol (CORGARD) 20 MG tablet Take 1 tablet (20 mg total) by mouth daily. (Patient taking differently: Take 20 mg by mouth at bedtime. ) 90 tablet 3  . ondansetron (ZOFRAN) 8 MG tablet Take 1 tablet (8 mg total) by mouth every 8 (eight) hours as needed. Start on the third day after chemotherapy. 30 tablet 1  . pantoprazole (PROTONIX) 40 MG tablet Take 1 tablet (40 mg total) by mouth daily.    . prochlorperazine (COMPAZINE) 10 MG tablet Take 1 tablet (10 mg total) by mouth every 6 (six) hours as needed (Nausea or vomiting). 30 tablet 1  . rotigotine (NEUPRO) 4 MG/24HR Place 1 patch onto the  skin daily. 30 patch 7   No current facility-administered medications for this visit.   Facility-Administered Medications Ordered in Other Visits  Medication Dose Route Frequency Provider Last Rate Last Admin  . dexamethasone (DECADRON) injection 5 mg  5 mg Intravenous Once Alvy Bimler, Anyelo Mccue, MD      . DOCEtaxel (TAXOTERE) 70 mg in sodium chloride 0.9 % 150 mL chemo infusion  37.5 mg/m2 (Treatment Plan Recorded) Intravenous Once Alvy Bimler, Samiel Peel, MD      . heparin lock flush 100 unit/mL  500 Units Intracatheter Once PRN Alvy Bimler, Byrd Rushlow, MD      . pegfilgrastim (NEULASTA ONPRO KIT) injection 6 mg  6 mg Subcutaneous Once Kamaria Lucia, MD      . sodium chloride flush (NS) 0.9 % injection 10 mL  10 mL Intracatheter PRN Alvy Bimler, Cloie Wooden, MD        PHYSICAL EXAMINATION: ECOG PERFORMANCE STATUS: 1 - Symptomatic but completely ambulatory  Vitals:   02/09/19 1248  BP: (!) 151/64  Pulse: 65  Resp: 17  Temp: 98.3 F (36.8 C)  SpO2: 100%   Filed Weights   02/09/19 1248  Weight: 184 lb 9.6 oz (83.7 kg)    GENERAL:alert, no distress and comfortable SKIN: skin color, texture, turgor are normal, no rashes or significant lesions EYES: normal, Conjunctiva are pink and non-injected, sclera clear OROPHARYNX:no exudate, no erythema and lips, buccal mucosa, and tongue normal  NECK: supple, thyroid normal size, non-tender, without nodularity LYMPH:  no palpable lymphadenopathy in the cervical, axillary or inguinal LUNGS: clear to auscultation and percussion with normal breathing effort HEART: regular rate & rhythm and no murmurs and no lower extremity edema ABDOMEN:abdomen soft, non-tender and normal bowel sounds Musculoskeletal:no cyanosis of digits and no clubbing  NEURO: alert & oriented x 3 with fluent speech, no focal motor/sensory deficits  LABORATORY DATA:  I have reviewed the data as listed    Component Value Date/Time   NA 144 02/09/2019 1223   NA 142 02/06/2017 0742   K 3.4 (L) 02/09/2019 1223   K 3.9  02/06/2017 0742   CL 109 02/09/2019 1223   CO2 22 02/09/2019 1223   CO2 22 02/06/2017 0742   GLUCOSE 132 (H) 02/09/2019 1223   GLUCOSE 156 (H) 02/06/2017 0742   BUN 21 02/09/2019 1223   BUN 13.5 02/06/2017 0742   CREATININE 0.82 02/09/2019 1223   CREATININE 0.8 02/06/2017 0742   CALCIUM 8.4 (L) 02/09/2019 1223  CALCIUM 8.5 02/06/2017 0742   PROT 7.7 02/09/2019 1223   PROT 6.9 02/06/2017 0742   ALBUMIN 3.6 02/09/2019 1223   ALBUMIN 3.7 02/06/2017 0742   AST 20 02/09/2019 1223   AST 47 (H) 02/06/2017 0742   ALT 20 02/09/2019 1223   ALT 54 02/06/2017 0742   ALKPHOS 149 (H) 02/09/2019 1223   ALKPHOS 130 02/06/2017 0742   BILITOT 0.7 02/09/2019 1223   BILITOT 0.65 02/06/2017 0742   GFRNONAA >60 02/09/2019 1223   GFRAA >60 02/09/2019 1223    No results found for: SPEP, UPEP  Lab Results  Component Value Date   WBC 5.5 02/09/2019   NEUTROABS 3.8 02/09/2019   HGB 10.2 (L) 02/09/2019   HCT 31.5 (L) 02/09/2019   MCV 96.0 02/09/2019   PLT 116 (L) 02/09/2019      Chemistry      Component Value Date/Time   NA 144 02/09/2019 1223   NA 142 02/06/2017 0742   K 3.4 (L) 02/09/2019 1223   K 3.9 02/06/2017 0742   CL 109 02/09/2019 1223   CO2 22 02/09/2019 1223   CO2 22 02/06/2017 0742   BUN 21 02/09/2019 1223   BUN 13.5 02/06/2017 0742   CREATININE 0.82 02/09/2019 1223   CREATININE 0.8 02/06/2017 0742      Component Value Date/Time   CALCIUM 8.4 (L) 02/09/2019 1223   CALCIUM 8.5 02/06/2017 0742   ALKPHOS 149 (H) 02/09/2019 1223   ALKPHOS 130 02/06/2017 0742   AST 20 02/09/2019 1223   AST 47 (H) 02/06/2017 0742   ALT 20 02/09/2019 1223   ALT 54 02/06/2017 0742   BILITOT 0.7 02/09/2019 1223   BILITOT 0.65 02/06/2017 0742       All questions were answered. The patient knows to call the clinic with any problems, questions or concerns. No barriers to learning was detected.  I spent 15 minutes counseling the patient face to face. The total time spent in the appointment  was 20 minutes and more than 50% was on counseling and review of test results  [name], MD 02/09/2019 1:55 PM

## 2019-02-09 NOTE — Assessment & Plan Note (Signed)
Her blood pressure is intermittently elevated We will continue to monitor closely

## 2019-02-10 LAB — SAMPLE TO BLOOD BANK

## 2019-02-22 ENCOUNTER — Encounter (HOSPITAL_COMMUNITY): Payer: Self-pay | Admitting: Gastroenterology

## 2019-02-25 ENCOUNTER — Other Ambulatory Visit (HOSPITAL_COMMUNITY)
Admission: RE | Admit: 2019-02-25 | Discharge: 2019-02-25 | Disposition: A | Discharge status: Home / Self Care | Payer: Medicare Other | Source: Ambulatory Visit | Attending: Gastroenterology | Admitting: Gastroenterology

## 2019-02-25 DIAGNOSIS — Z01812 Encounter for preprocedural laboratory examination: Secondary | ICD-10-CM | POA: Insufficient documentation

## 2019-02-25 DIAGNOSIS — Z20822 Contact with and (suspected) exposure to covid-19: Secondary | ICD-10-CM | POA: Insufficient documentation

## 2019-02-26 LAB — NOVEL CORONAVIRUS, NAA (HOSP ORDER, SEND-OUT TO REF LAB; TAT 18-24 HRS): SARS-CoV-2, NAA: NOT DETECTED

## 2019-02-26 NOTE — Progress Notes (Signed)
Spoke with patient, preop call completed, has gotten covid test, aware to quarantine, answered questions.  Patient would like port accessed instead of peripheral iv.

## 2019-02-28 NOTE — Anesthesia Preprocedure Evaluation (Addendum)
Anesthesia Evaluation  Patient identified by MRN, date of birth, ID band Patient awake    Reviewed: Allergy & Precautions, NPO status , Patient's Chart, lab work & pertinent test results  History of Anesthesia Complications (+) PONV  Airway Mallampati: II  TM Distance: >3 FB Neck ROM: Full    Dental no notable dental hx. (+) Teeth Intact   Pulmonary COPD, former smoker,    Pulmonary exam normal breath sounds clear to auscultation       Cardiovascular hypertension, Pt. on medications and Pt. on home beta blockers + Past MI  Normal cardiovascular exam Rhythm:Regular Rate:Normal     Neuro/Psych    GI/Hepatic (+) Cirrhosis   Esophageal Varices and ascites    ,   Endo/Other  diabetes, Insulin Dependent  Renal/GU K+ 3.4 Cr 0.82     Musculoskeletal   Abdominal (+) + obese,   Peds  Hematology Hgb 10.2 Plt 116   Anesthesia Other Findings   Reproductive/Obstetrics                            Anesthesia Physical Anesthesia Plan  ASA: III  Anesthesia Plan: MAC   Post-op Pain Management:    Induction: Intravenous  PONV Risk Score and Plan: Treatment may vary due to age or medical condition  Airway Management Planned: Natural Airway and Nasal Cannula  Additional Equipment: None  Intra-op Plan:   Post-operative Plan:   Informed Consent: I have reviewed the patients History and Physical, chart, labs and discussed the procedure including the risks, benefits and alternatives for the proposed anesthesia with the patient or authorized representative who has indicated his/her understanding and acceptance.     Dental advisory given  Plan Discussed with: CRNA  Anesthesia Plan Comments: (Hx  od GI bleed 2nd to Varices for EGD)       Anesthesia Quick Evaluation

## 2019-03-01 ENCOUNTER — Other Ambulatory Visit: Payer: Self-pay

## 2019-03-01 ENCOUNTER — Encounter (HOSPITAL_COMMUNITY)
Admission: RE | Disposition: A | Discharge status: Home / Self Care | Payer: Self-pay | Source: Home / Self Care | Attending: Gastroenterology

## 2019-03-01 ENCOUNTER — Ambulatory Visit (HOSPITAL_COMMUNITY)
Admission: RE | Admit: 2019-03-01 | Discharge: 2019-03-01 | Disposition: A | Discharge status: Home / Self Care | Payer: Medicare Other | Attending: Gastroenterology | Admitting: Gastroenterology

## 2019-03-01 ENCOUNTER — Ambulatory Visit (HOSPITAL_COMMUNITY): Payer: Medicare Other | Admitting: Anesthesiology

## 2019-03-01 ENCOUNTER — Encounter (HOSPITAL_COMMUNITY): Payer: Self-pay | Admitting: Gastroenterology

## 2019-03-01 DIAGNOSIS — K766 Portal hypertension: Secondary | ICD-10-CM | POA: Diagnosis not present

## 2019-03-01 DIAGNOSIS — R202 Paresthesia of skin: Secondary | ICD-10-CM | POA: Diagnosis not present

## 2019-03-01 DIAGNOSIS — J45909 Unspecified asthma, uncomplicated: Secondary | ICD-10-CM | POA: Diagnosis not present

## 2019-03-01 DIAGNOSIS — I8510 Secondary esophageal varices without bleeding: Secondary | ICD-10-CM | POA: Insufficient documentation

## 2019-03-01 DIAGNOSIS — Z86010 Personal history of colonic polyps: Secondary | ICD-10-CM | POA: Diagnosis not present

## 2019-03-01 DIAGNOSIS — Z8052 Family history of malignant neoplasm of bladder: Secondary | ICD-10-CM | POA: Insufficient documentation

## 2019-03-01 DIAGNOSIS — Z833 Family history of diabetes mellitus: Secondary | ICD-10-CM | POA: Insufficient documentation

## 2019-03-01 DIAGNOSIS — Z91013 Allergy to seafood: Secondary | ICD-10-CM | POA: Diagnosis not present

## 2019-03-01 DIAGNOSIS — Z91048 Other nonmedicinal substance allergy status: Secondary | ICD-10-CM | POA: Insufficient documentation

## 2019-03-01 DIAGNOSIS — E119 Type 2 diabetes mellitus without complications: Secondary | ICD-10-CM | POA: Diagnosis not present

## 2019-03-01 DIAGNOSIS — Z803 Family history of malignant neoplasm of breast: Secondary | ICD-10-CM | POA: Insufficient documentation

## 2019-03-01 DIAGNOSIS — Z8249 Family history of ischemic heart disease and other diseases of the circulatory system: Secondary | ICD-10-CM | POA: Insufficient documentation

## 2019-03-01 DIAGNOSIS — G2581 Restless legs syndrome: Secondary | ICD-10-CM | POA: Insufficient documentation

## 2019-03-01 DIAGNOSIS — Z888 Allergy status to other drugs, medicaments and biological substances status: Secondary | ICD-10-CM | POA: Diagnosis not present

## 2019-03-01 DIAGNOSIS — K3189 Other diseases of stomach and duodenum: Secondary | ICD-10-CM | POA: Insufficient documentation

## 2019-03-01 DIAGNOSIS — K7460 Unspecified cirrhosis of liver: Secondary | ICD-10-CM | POA: Insufficient documentation

## 2019-03-01 DIAGNOSIS — Z881 Allergy status to other antibiotic agents status: Secondary | ICD-10-CM | POA: Diagnosis not present

## 2019-03-01 DIAGNOSIS — Z6834 Body mass index (BMI) 34.0-34.9, adult: Secondary | ICD-10-CM | POA: Insufficient documentation

## 2019-03-01 DIAGNOSIS — N3281 Overactive bladder: Secondary | ICD-10-CM | POA: Diagnosis not present

## 2019-03-01 DIAGNOSIS — Z90710 Acquired absence of both cervix and uterus: Secondary | ICD-10-CM | POA: Insufficient documentation

## 2019-03-01 DIAGNOSIS — Z8543 Personal history of malignant neoplasm of ovary: Secondary | ICD-10-CM | POA: Insufficient documentation

## 2019-03-01 DIAGNOSIS — Z823 Family history of stroke: Secondary | ICD-10-CM | POA: Insufficient documentation

## 2019-03-01 DIAGNOSIS — I252 Old myocardial infarction: Secondary | ICD-10-CM | POA: Insufficient documentation

## 2019-03-01 DIAGNOSIS — Z880 Allergy status to penicillin: Secondary | ICD-10-CM | POA: Diagnosis not present

## 2019-03-01 DIAGNOSIS — Z8261 Family history of arthritis: Secondary | ICD-10-CM | POA: Insufficient documentation

## 2019-03-01 DIAGNOSIS — I10 Essential (primary) hypertension: Secondary | ICD-10-CM | POA: Diagnosis not present

## 2019-03-01 DIAGNOSIS — Z85828 Personal history of other malignant neoplasm of skin: Secondary | ICD-10-CM | POA: Diagnosis not present

## 2019-03-01 DIAGNOSIS — H409 Unspecified glaucoma: Secondary | ICD-10-CM | POA: Insufficient documentation

## 2019-03-01 DIAGNOSIS — E669 Obesity, unspecified: Secondary | ICD-10-CM | POA: Diagnosis not present

## 2019-03-01 DIAGNOSIS — Z87891 Personal history of nicotine dependence: Secondary | ICD-10-CM | POA: Insufficient documentation

## 2019-03-01 DIAGNOSIS — Z79899 Other long term (current) drug therapy: Secondary | ICD-10-CM | POA: Insufficient documentation

## 2019-03-01 DIAGNOSIS — Z90722 Acquired absence of ovaries, bilateral: Secondary | ICD-10-CM | POA: Insufficient documentation

## 2019-03-01 DIAGNOSIS — Z7952 Long term (current) use of systemic steroids: Secondary | ICD-10-CM | POA: Insufficient documentation

## 2019-03-01 DIAGNOSIS — Z825 Family history of asthma and other chronic lower respiratory diseases: Secondary | ICD-10-CM | POA: Insufficient documentation

## 2019-03-01 HISTORY — PX: ESOPHAGOGASTRODUODENOSCOPY (EGD) WITH PROPOFOL: SHX5813

## 2019-03-01 HISTORY — PX: ESOPHAGEAL BANDING: SHX5518

## 2019-03-01 LAB — GLUCOSE, CAPILLARY: Glucose-Capillary: 175 mg/dL — ABNORMAL HIGH (ref 70–99)

## 2019-03-01 SURGERY — ESOPHAGOGASTRODUODENOSCOPY (EGD) WITH PROPOFOL
Anesthesia: Monitor Anesthesia Care

## 2019-03-01 MED ORDER — PROPOFOL 10 MG/ML IV BOLUS
INTRAVENOUS | Status: DC | PRN
  Administered 2019-03-01 (×3): 20 mg via INTRAVENOUS

## 2019-03-01 MED ORDER — CHLORHEXIDINE GLUCONATE CLOTH 2 % EX PADS: 6.0000 | MEDICATED_PAD | Freq: Every day | CUTANEOUS | Status: DC

## 2019-03-01 MED ORDER — SODIUM CHLORIDE 0.9% FLUSH: 10.0000 mL | Freq: Two times a day (BID) | INTRAVENOUS | Status: DC

## 2019-03-01 MED ORDER — SUCRALFATE 1 GM/10ML PO SUSP: 1.0000 g | Freq: Two times a day (BID) | ORAL | 1 refills | Status: DC

## 2019-03-01 MED ORDER — HYDROMORPHONE HCL 1 MG/ML IJ SOLN
1.0000 mg | Freq: Once | INTRAMUSCULAR | Status: AC
  Administered 2019-03-01: 1 mg via INTRAVENOUS

## 2019-03-01 MED ORDER — PROPOFOL 500 MG/50ML IV EMUL
INTRAVENOUS | Status: DC | PRN
  Administered 2019-03-01: 80 ug/kg/min via INTRAVENOUS

## 2019-03-01 MED ORDER — HYDROMORPHONE HCL 1 MG/ML IJ SOLN
INTRAMUSCULAR | Status: AC
  Filled 2019-03-01: qty 1

## 2019-03-01 MED ORDER — PROPOFOL 500 MG/50ML IV EMUL
INTRAVENOUS | Status: AC
  Filled 2019-03-01: qty 50

## 2019-03-01 MED ORDER — SODIUM CHLORIDE 0.9% FLUSH: 10.0000 mL | INTRAVENOUS | Status: DC | PRN

## 2019-03-01 MED ORDER — HEPARIN SOD (PORK) LOCK FLUSH 100 UNIT/ML IV SOLN
500.0000 [IU] | INTRAVENOUS | Status: AC | PRN
  Administered 2019-03-01: 500 [IU]

## 2019-03-01 MED ORDER — SODIUM CHLORIDE 0.9 % IV SOLN: INTRAVENOUS | Status: DC

## 2019-03-01 MED ORDER — LACTATED RINGERS IV SOLN: INTRAVENOUS | Status: DC | PRN

## 2019-03-01 SURGICAL SUPPLY — 14 items

## 2019-03-01 NOTE — Op Note (Addendum)
[facility] Patient Name: Olivia Benson Procedure Date: 03/01/2019 MRN: [id number] Attending MD: Ronnette Juniper , MD Date of Birth: 07/19/1945 CSN: [id number] Age: 74 Admit Type: Outpatient Procedure:                Upper GI endoscopy Indications:              For therapy of esophageal varices Providers:                Ronnette Juniper, MD, Grace Isaac, RN, William Dalton,                            Technician, Theodora Blow, Technician Referring MD:              Medicines:                Monitored Anesthesia Care Complications:            No immediate complications. Estimated blood loss:                            Minimal. Estimated Blood Loss:     Estimated blood loss was minimal. Procedure:                Pre-Anesthesia Assessment:                           - Prior to the procedure, a History and Physical                            was performed, and patient medications and                            allergies were reviewed. The patient's tolerance of                            previous anesthesia was also reviewed. The risks                            and benefits of the procedure and the sedation                            options and risks were discussed with the patient.                            All questions were answered, and informed consent                            was obtained. Prior Anticoagulants: The patient has                            taken no previous anticoagulant or antiplatelet                            agents. ASA Grade Assessment: III - A patient with  severe systemic disease. After reviewing the risks                            and benefits, the patient was deemed in                            satisfactory condition to undergo the procedure.                           After obtaining informed consent, the endoscope was                            passed under direct vision. Throughout the                             procedure, the patient's blood pressure, pulse, and                            oxygen saturations were monitored continuously. The                            GIF-H190 ([id number]) Olympus gastroscope was                            introduced through the mouth, and advanced to the                            second part of duodenum. The upper GI endoscopy was                            accomplished without difficulty. The patient                            tolerated the procedure well. Findings:      Grade II varices were found in the middle third of the esophagus and in       the lower third of the esophagus. They were medium in size. Four bands       were successfully placed with complete eradication, resulting in       deflation of varices. There was no bleeding during and at the end of the       procedure.      The Z-line was regular and was found 37 cm from the incisors.      Localized mild mucosal changes characterized by scarring from prior       banding were found in the middle third of the esophagus and in the lower       third of the esophagus.      Moderate portal hypertensive gastropathy was found in the gastric body.      A few localized small erosions, minimal self limited oozing were found       in the cardia and in the gastric fundus, likely related to gastric       insufflation and scope trauma due to underlying mucosal friability.      The examined duodenum was normal.      Diffuse moderately erythematous mucosa without bleeding  was found in the       gastric antrum. Impression:               - Grade II esophageal varices. Completely                            eradicated. Banded.                           - Z-line regular, 37 cm from the incisors.                           - Scarred mucosa in the esophagus.                           - Portal hypertensive gastropathy.                           - Erosive gastropathy minimal self limited oozing.                           -  Normal examined duodenum.                           - Erythematous mucosa in the antrum.                           - No specimens collected. Moderate Sedation:      Patient did not receive moderate sedation for this procedure, but       instead received monitored anesthesia care. Recommendation:           - Patient has a contact number available for                            emergencies. The signs and symptoms of potential                            delayed complications were discussed with the                            patient. Return to normal activities tomorrow.                            Written discharge instructions were provided to the                            patient.                           - Clear liquid diet for 6 hours.                           - Use sucralfate suspension 1 gram PO BID for 1                            week.                           -  Continue Nadolol.                           - PPI daily.                           - Repeat upper endoscopy in 2 months for                            retreatment. Procedure Code(s):        --- Professional ---                           [phone], Esophagogastroduodenoscopy, flexible,                            transoral; with band ligation of esophageal/gastric                            varices Diagnosis Code(s):        --- Professional ---                           I85.00, Esophageal varices without bleeding                           K22.8, Other specified diseases of esophagus                           K31.89, Other diseases of stomach and duodenum                           K76.6, Portal hypertension CPT copyright 2019 American Medical Association. All rights reserved. The codes documented in this report are preliminary and upon coder review may  be revised to meet current compliance requirements. Ronnette Juniper, MD 03/01/2019 9:00:14 AM This report has been signed electronically. Number of Addenda: 0

## 2019-03-01 NOTE — H&P (Signed)
Olivia Benson is an 74 y.o. female.   Chief Complaint: Esophageal varices for EGD and banding HPI: History of cirrhosis, possibly related to Olivia Benson, esophageal varices, multiple EGD, last in 09/2018 showed grade 3 esophageal varices and five bands were placed, last colonoscopy in 09/2018 with removal of polyps. She is on nadolol 20 mg p.o. daily.  Past Medical History:  Diagnosis Date  . Asthma   . Cirrhosis ([place])   . Complication of anesthesia    slow to wake up / n & v  . Diabetes mellitus   . Elevated LFTs   . Family history of adverse reaction to anesthesia    multiple family members have had problems such as slow to wake / "flat lined" /  ventilator    . Frequency of urination   . Glaucoma, narrow-angle   . History of skin cancer   . Hypertension   . Non-ST elevated myocardial infarction (non-STEMI) [facility]) Sept 2012   Mild elevation in troponin and CK MB but no evidence of coronary disease at time of cath; per patient " i had undiagnosed heart event but not a heart attack"   . Obesity   . 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 Overactive bladder   . PONV (postoperative nausea and vomiting)    no problems with feb  egd   . Portal hypertension ([place])   . Restless leg syndrome   . Tingling    hands and feet    Past Surgical History:  Procedure Laterality Date  . ABDOMINAL ULTRASOUND  Sept 2012   No gallbladder disease, + fatty liver  . CARDIAC CATHETERIZATION  10/25/2010   EF 60%; Normal coronaries  . COLONOSCOPY  10/18/2005  . COLONOSCOPY WITH PROPOFOL N/A 09/28/2018   Procedure: COLONOSCOPY WITH PROPOFOL;  Surgeon: Ronnette Juniper, MD;  Location: [facility];  Service: Gastroenterology;  Laterality: N/A;  . COSMETIC SURGERY  2003  . DENTAL SURGERY    . ESOPHAGEAL BANDING  02/10/2018   Procedure: ESOPHAGEAL BANDING;  Surgeon: Ronnette Juniper, MD;  Location: [facility];  Service:  Gastroenterology;;  . ESOPHAGEAL BANDING N/A 03/23/2018   Procedure: ESOPHAGEAL BANDING;  Surgeon: Ronnette Juniper, MD;  Location: [facility];  Service: Gastroenterology;  Laterality: N/A;  . ESOPHAGEAL BANDING N/A 09/28/2018   Procedure: ESOPHAGEAL BANDING;  Surgeon: Ronnette Juniper, MD;  Location: [facility];  Service: Gastroenterology;  Laterality: N/A;  . ESOPHAGOGASTRODUODENOSCOPY N/A 03/23/2018   Procedure: ESOPHAGOGASTRODUODENOSCOPY (EGD);  Surgeon: Ronnette Juniper, MD;  Location: [facility];  Service: Gastroenterology;  Laterality: N/A;  . ESOPHAGOGASTRODUODENOSCOPY (EGD) WITH PROPOFOL N/A 02/10/2018   Procedure: ESOPHAGOGASTRODUODENOSCOPY (EGD) WITH PROPOFOL;  Surgeon: Ronnette Juniper, MD;  Location: [facility];  Service: Gastroenterology;  Laterality: N/A;  . ESOPHAGOGASTRODUODENOSCOPY (EGD) WITH PROPOFOL N/A 09/28/2018   Procedure: ESOPHAGOGASTRODUODENOSCOPY (EGD) WITH PROPOFOL;  Surgeon: Ronnette Juniper, MD;  Location: [facility];  Service: Gastroenterology;  Laterality: N/A;  . EYE SURGERY Bilateral 09/19/14 R eye, 08/2014 L eye   open angles in both eyes d/t narrow angle glaucoma  . LAPAROTOMY N/A 03/29/2014   Procedure: EXPLORATORY LAPAROTOMY;  Surgeon: Everitt Amber, MD;  Location: [facility];  Service: Gynecology;  Laterality: N/A;  . ORIF ANKLE FRACTURE Right 03/09/2018   Procedure: OPEN REDUCTION INTERNAL FIXATION (ORIF) RIGHT LATERAL MALLEOLUS;  Surgeon: Leandrew Koyanagi, MD;  Location: [place];  Service: Orthopedics;  Laterality: Right;  . PARTIAL HYSTERECTOMY  1988  .  POLYPECTOMY  09/28/2018   Procedure: POLYPECTOMY;  Surgeon: Ronnette Juniper, MD;  Location: [facility];  Service: Gastroenterology;;  . SALPINGOOPHORECTOMY Bilateral 03/29/2014   Procedure: BILATERAL SALPINGO OOPHORECTOMY, OMENTECTOMY, DEBULKING;  Surgeon: Everitt Amber, MD;  Location: [facility];  Service: Gynecology;  Laterality: Bilateral;  . TONSILLECTOMY  1971  . TUBAL LIGATION    . [facility]  11/24/2009    Family History  Problem  Relation Age of Onset  . Asthma Mother   . Diabetes Mother   . Hypertension Mother   . Allergies Mother   . Rheum arthritis Mother   . Bladder Cancer Father 76       former heavy smoker  . Hypertension Father   . Stroke Father   . Breast cancer Sister 34  . Diabetes Sister        x 3  . Allergies Sister   . Asthma Sister        x 2   . Breast cancer Maternal Aunt 85  . Breast cancer Cousin 36       (maternal) bilateral cancer  . Aneurysm Son 29  . Diabetes Brother   . Allergies Brother   . Asthma Brother    Social History:  reports that she quit smoking about 37 years ago. Her smoking use included cigarettes. She has a 1.00 pack-year smoking history. She has never used smokeless tobacco. She reports that she does not drink alcohol or use drugs.  Allergies:  Allergies  Allergen Reactions  . Carboplatin Anaphylaxis  . Shellfish Allergy Anaphylaxis    HER THROAT SWELLS UP AND CLOSES  . Vancomycin Rash    Pt developed a large red rash covering her entire arm after infusion. Suspected red-man syndrome vs infiltration  . Benadryl [Diphenhydramine Hcl] Other (See Comments)    Restless legs   . Penicillins Rash    DID THE REACTION INVOLVE: Swelling of the face/tongue/throat, SOB, or low BP? No Sudden or severe rash/hives, skin peeling, or the inside of the mouth or nose? No Did it require medical treatment? No When did it last happen?Childhood allergy If all above answers are "NO", may proceed with cephalosporin use.   . Tape Itching    Red itch with some tapes    Medications Prior to Admission  Medication Sig Dispense Refill  . cholecalciferol (VITAMIN D) 1000 units tablet Take 1,000 Units by mouth at bedtime.     Marland Kitchen dexamethasone (DECADRON) 4 MG tablet Start the day before Taxotere, take 1 tablet in the morning, and then 1 tablet daily after chemo for 2 days. Pls dispense 30 tabs (Patient taking differently: Take 4 mg by mouth See admin instructions. Start the day  before Taxotere, take 1 tablet in the morning, and then 1 tablet daily after chemo for 2 days. Pls dispense 30 tabs) 30 tablet 1  . empagliflozin (JARDIANCE) 10 MG TABS tablet Take 10 mg by mouth daily.    Marland Kitchen gabapentin (NEURONTIN) 300 MG capsule TAKE 2 CAPSULES(600 MG) BY MOUTH TWICE DAILY (Patient taking differently: Take 600 mg by mouth 2 (two) times daily. ) 120 capsule 11  . HUMALOG MIX 75/25 KWIKPEN (75-25) 100 UNIT/ML Kwikpen Inject 10 Units into the skin 2 (two) times daily. Before breakfast & before supper    . HYDROcodone-acetaminophen (NORCO/VICODIN) 5-325 MG tablet Take 1 tablet by mouth every 6 (six) hours as needed for moderate pain. 60 tablet 0  . lidocaine-prilocaine (EMLA) cream Apply to Porta-cath  1-2 hours prior to access as directed. (Patient  taking differently: Apply 1 application topically daily as needed (port access). ) 30 g 9  . magic mouthwash w/lidocaine SOLN Take 5 mLs by mouth every 4 (four) hours as needed for mouth pain (Swish & spit).     . nadolol (CORGARD) 20 MG tablet Take 1 tablet (20 mg total) by mouth daily. (Patient taking differently: Take 20 mg by mouth at bedtime. ) 90 tablet 3  . pantoprazole (PROTONIX) 40 MG tablet Take 1 tablet (40 mg total) by mouth daily.    . rotigotine (NEUPRO) 4 MG/24HR Place 1 patch onto the skin daily. 30 patch 7  . ondansetron (ZOFRAN) 8 MG tablet Take 1 tablet (8 mg total) by mouth every 8 (eight) hours as needed. Start on the third day after chemotherapy. 30 tablet 1  . prochlorperazine (COMPAZINE) 10 MG tablet Take 1 tablet (10 mg total) by mouth every 6 (six) hours as needed (Nausea or vomiting). 30 tablet 1    Results for orders placed or performed during the hospital encounter of 03/01/19 (from the past 48 hour(s))  Glucose, capillary     Status: Abnormal   Collection Time: 03/01/19  8:01 AM  Result Value Ref Range   Glucose-Capillary 175 (H) 70 - 99 mg/dL   *Note: Due to a large number of results and/or encounters for  the requested time period, some results have not been displayed. A complete set of results can be found in Results Review.   No results found.  Review of Systems  Constitutional: Positive for fatigue.  HENT: Negative.   Respiratory: Negative.   Cardiovascular: Negative.   Gastrointestinal: Negative.   Neurological: Positive for dizziness.  Psychiatric/Behavioral: Negative.     Blood pressure (!) 169/71, pulse 96, temperature 97.7 F (36.5 C), temperature source Temporal, resp. rate 17, height 5' 1.5" (1.562 m), weight 83.5 kg, SpO2 96 %. Physical Exam  Constitutional: She is oriented to person, place, and time. She appears well-developed and well-nourished.  HENT:  Head: Normocephalic.  Eyes: Conjunctivae are normal.  Cardiovascular: Normal rate and regular rhythm.  Respiratory: Effort normal.  GI: Soft. She exhibits no distension.  Musculoskeletal:        General: Normal range of motion.     Cervical back: Neck supple.  Neurological: She is alert and oriented to person, place, and time.  Psychiatric: She has a normal mood and affect.     Assessment/Plan Cirrhosis of liver likely related to [place]. Labs from 02/09/2019 showed a normal renal function, bilirubin 0.7, creatinine 0.82, platelet count 116 and PT/INR from 01/03/2019 was 14.9 and 1.2 Esophageal varices, multiple banding sessions, last in 09/2018. On nadolol 20 mg daily. Repeat EGD with possible banding today. The risks and the benefits of the procedure were discussed with the patient in details. She understands and verbalizes consent.  Ronnette Juniper, MD 03/01/2019, 8:02 AM

## 2019-03-01 NOTE — Brief Op Note (Signed)
03/01/2019  9:00 AM  PATIENT:  Olivia Benson  74 y.o. female  PRE-OPERATIVE DIAGNOSIS:  Esophageal varices  POST-OPERATIVE DIAGNOSIS:  Grade 2 varices, 4 bands placed, portal htn gastrop.  PROCEDURE:  Procedure(s): ESOPHAGOGASTRODUODENOSCOPY (EGD) WITH PROPOFOL with banding (N/A) ESOPHAGEAL BANDING  SURGEON:  Surgeon(s) and Role:    Ronnette Juniper, MD - Primary  PHYSICIAN ASSISTANT:   ASSISTANTS: Grace Isaac, RN,  William Dalton, Tech, Theodora Blow, Tech  ANESTHESIA:   MAC  EBL:  Minimal  BLOOD ADMINISTERED:none  DRAINS: none   LOCAL MEDICATIONS USED:  NONE  SPECIMEN:  No Specimen  DISPOSITION OF SPECIMEN:  N/A  COUNTS:  YES  TOURNIQUET:  * No tourniquets in log *  DICTATION: .Dragon Dictation  PLAN OF CARE:discharge home  PATIENT DISPOSITION:  PACU - hemodynamically stable.   Delay start of Pharmacological VTE agent (>24hrs) due to surgical blood loss or risk of bleeding: not applicable

## 2019-03-01 NOTE — Anesthesia Postprocedure Evaluation (Signed)
Anesthesia Post Note  Patient: Olivia Benson  Procedure(s) Performed: ESOPHAGOGASTRODUODENOSCOPY (EGD) WITH PROPOFOL with banding (N/A ) ESOPHAGEAL BANDING     Patient location during evaluation: PACU Anesthesia Type: MAC Level of consciousness: awake and alert Pain management: pain level controlled Vital Signs Assessment: post-procedure vital signs reviewed and stable Respiratory status: spontaneous breathing, nonlabored ventilation, respiratory function stable and patient connected to nasal cannula oxygen Cardiovascular status: stable and blood pressure returned to baseline Postop Assessment: no apparent nausea or vomiting Anesthetic complications: no    Last Vitals:  Vitals:   03/01/19 1000 03/01/19 1010  BP: (!) 156/67 (!) 157/55  Pulse: 69 70  Resp: 14 (!) 22  Temp:    SpO2: 97% 98%    Last Pain:  Vitals:   03/01/19 1010  TempSrc:   PainSc: 4                  Barnet Glasgow

## 2019-03-01 NOTE — Discharge Instructions (Signed)
YOU HAD AN ENDOSCOPIC PROCEDURE TODAY: Refer to the procedure report and other information in the discharge instructions given to you for any specific questions about what was found during the examination. If this information does not answer your questions, please call [facility] office at [phone] to clarify.   YOU SHOULD EXPECT: Some feelings of bloating in the abdomen. Passage of more gas than usual. Walking can help get rid of the air that was put into your GI tract during the procedure and reduce the bloating. If you had a lower endoscopy (such as a colonoscopy or flexible sigmoidoscopy) you may notice spotting of blood in your stool or on the toilet paper. Some abdominal soreness may be present for a day or two, also.  DIET: Your first meal following the procedure should be a light meal and then it is ok to progress to your normal diet. A half-sandwich or bowl of soup is an example of a good first meal. Heavy or fried foods are harder to digest and may make you feel nauseous or bloated. Drink plenty of fluids but you should avoid alcoholic beverages for 24 hours. If you had a esophageal dilation, please see attached instructions for diet.   ACTIVITY: Your care partner should take you home directly after the procedure. You should plan to take it easy, moving slowly for the rest of the day. You can resume normal activity the day after the procedure however YOU SHOULD NOT DRIVE, use power tools, machinery or perform tasks that involve climbing or major physical exertion for 24 hours (because of the sedation medicines used during the test).   SYMPTOMS TO REPORT IMMEDIATELY: A gastroenterologist can be reached at any hour. Please call [phone]  for any of the following symptoms:  . Following upper endoscopy (EGD, EUS, ERCP, esophageal dilation) Vomiting of blood or coffee ground material  New, significant abdominal pain  New, significant chest pain or pain under the shoulder blades  Painful or  persistently difficult swallowing  New shortness of breath  Black, tarry-looking or red, bloody stools  FOLLOW UP:  If any biopsies were taken you will be contacted by phone or by letter within the next 1-3 weeks. Call [phone]  if you have not heard about the biopsies in 3 weeks.  Please also call with any specific questions about appointments or follow up tests.  Esophageal Variceal Ligation, Care After This sheet gives you information about how to care for yourself after your procedure. Your doctor may also give you more specific instructions. If you have problems or questions, contact your doctor. What can I expect after the procedure? After the procedure, it is common to have:  Bleeding.  Pain and soreness in your chest area.  Trouble swallowing. Follow these instructions at home:  Eating and drinking Follow instructions from your doctor about what you can eat or drink.  You will have limits on what you can eat for the first 1-2 days after your procedure.  You will start with a liquid diet. Later, you will start to eat soft foods.  Do not drink alcohol.  Activity  Return to your normal activities as told by your doctor. Ask your doctor what activities are safe for you.  Do not lift anything that is heavier than 10 lb (4.5 kg), or the limit that you are told, until your doctor says that it is safe.  Do not drive or use heavy machinery while taking prescription pain medicine. General instructions  Take over-the-counter and prescription  medicines only as told by your doctor.  Do not use any products that contain nicotine or tobacco, such as cigarettes and e-cigarettes. If you need help quitting, ask your doctor.  Keep all follow-up visits as told by your doctor. This is important. Contact a doctor if:  You have chest pain that lasts for more than 3 days after you go home.  You have trouble swallowing that lasts for more than 3 days after you go home.  You have a  fever or chills. Get help right away if:  You have bleeding from your throat.  You have bleeding from your bottom (rectum).  You throw up (vomit) bright red blood.  You are unable to swallow.  You are short of breath.  You have very bad chest pain or back pain. Summary  After the procedure, it is common to have pain, bleeding, and trouble swallowing.  Follow all your home care instructions.  Stay on a liquid or soft diet until your doctor says that you can go back to your normal diet.  Contact a doctor if you have chills, fever, chest pain, or trouble swallowing.  Get help right away if you have bleeding, are unable to swallow, or have very bad chest or back pain. This information is not intended to replace advice given to you by your health care provider. Make sure you discuss any questions you have with your health care provider. Document Revised: 05/06/2017 Document Reviewed: 05/06/2017 Elsevier Patient Education  [place].

## 2019-03-01 NOTE — Transfer of Care (Signed)
Immediate Anesthesia Transfer of Care Note  Patient: JAMILEE LAFOSSE  Procedure(s) Performed: Procedure(s): ESOPHAGOGASTRODUODENOSCOPY (EGD) WITH PROPOFOL with banding (N/A) ESOPHAGEAL BANDING  Patient Location: PACU  Anesthesia Type:MAC  Level of Consciousness:  sedated, patient cooperative and responds to stimulation  Airway & Oxygen Therapy:Patient Spontanous Breathing and Patient connected to face mask oxgen  Post-op Assessment:  Report given to PACU RN and Post -op Vital signs reviewed and stable  Post vital signs:  Reviewed and stable  Last Vitals:  Vitals:   03/01/19 0758 03/01/19 0900  BP: (!) 169/71 (!) 165/83  Pulse: 96 78  Resp: 17 (!) 23  Temp: 36.5 C   SpO2: 99% 35%    Complications: No apparent anesthesia complications

## 2019-03-01 NOTE — Op Note (Signed)
EGD was performed for variceal banding.   Findings: Grade 2 esophageal varices noted in mid and distal esophagus. Evidence of scarring from prior banding sessions. Total of 4 bands were deployed for complete eradication. GE junction was regular at 37 cm. Portal hypertensive gastropathy noted. Self-limited oozing from cardia and fundus due to mucosal friability and scope trauma. Erythematous antrum. Normal-appearing duodenum and duodenal bulb.  Recommendations: Continue nadolol 20 mg daily. Repeat EGD for retreatment in 2 months. Sucralfate 1 g twice daily suspension for 1 week. PPI daily. Clear liquid diet for 6 hours, thereafter may resume regular diet.  Ronnette Juniper, MD

## 2019-03-02 ENCOUNTER — Encounter: Payer: Self-pay | Admitting: *Deleted

## 2019-03-02 ENCOUNTER — Inpatient Hospital Stay: Payer: Medicare Other

## 2019-03-02 ENCOUNTER — Other Ambulatory Visit: Payer: Self-pay | Admitting: Hematology and Oncology

## 2019-03-02 ENCOUNTER — Inpatient Hospital Stay (HOSPITAL_BASED_OUTPATIENT_CLINIC_OR_DEPARTMENT_OTHER): Payer: Medicare Other | Admitting: Hematology and Oncology

## 2019-03-02 ENCOUNTER — Inpatient Hospital Stay: Payer: Medicare Other | Attending: Hematology and Oncology

## 2019-03-02 ENCOUNTER — Other Ambulatory Visit: Payer: Self-pay

## 2019-03-02 VITALS — BP 139/63 | HR 67 | Temp 98.3°F | Resp 18 | Ht 61.5 in | Wt 187.2 lb

## 2019-03-02 VITALS — BP 129/62 | HR 72 | Resp 18

## 2019-03-02 DIAGNOSIS — I8510 Secondary esophageal varices without bleeding: Secondary | ICD-10-CM | POA: Insufficient documentation

## 2019-03-02 DIAGNOSIS — T451X5A Adverse effect of antineoplastic and immunosuppressive drugs, initial encounter: Secondary | ICD-10-CM | POA: Insufficient documentation

## 2019-03-02 DIAGNOSIS — C482 Malignant neoplasm of peritoneum, unspecified: Secondary | ICD-10-CM

## 2019-03-02 DIAGNOSIS — C569 Malignant neoplasm of unspecified ovary: Secondary | ICD-10-CM

## 2019-03-02 DIAGNOSIS — K7581 Nonalcoholic steatohepatitis (NASH): Secondary | ICD-10-CM | POA: Insufficient documentation

## 2019-03-02 DIAGNOSIS — G8929 Other chronic pain: Secondary | ICD-10-CM

## 2019-03-02 DIAGNOSIS — K7460 Unspecified cirrhosis of liver: Secondary | ICD-10-CM

## 2019-03-02 DIAGNOSIS — D509 Iron deficiency anemia, unspecified: Secondary | ICD-10-CM | POA: Insufficient documentation

## 2019-03-02 DIAGNOSIS — Z299 Encounter for prophylactic measures, unspecified: Secondary | ICD-10-CM

## 2019-03-02 DIAGNOSIS — Z7189 Other specified counseling: Secondary | ICD-10-CM

## 2019-03-02 DIAGNOSIS — Z794 Long term (current) use of insulin: Secondary | ICD-10-CM | POA: Insufficient documentation

## 2019-03-02 DIAGNOSIS — Z5111 Encounter for antineoplastic chemotherapy: Secondary | ICD-10-CM | POA: Insufficient documentation

## 2019-03-02 DIAGNOSIS — D61810 Antineoplastic chemotherapy induced pancytopenia: Secondary | ICD-10-CM

## 2019-03-02 DIAGNOSIS — Z79899 Other long term (current) drug therapy: Secondary | ICD-10-CM | POA: Diagnosis not present

## 2019-03-02 DIAGNOSIS — M545 Low back pain, unspecified: Secondary | ICD-10-CM

## 2019-03-02 DIAGNOSIS — E1140 Type 2 diabetes mellitus with diabetic neuropathy, unspecified: Secondary | ICD-10-CM | POA: Insufficient documentation

## 2019-03-02 DIAGNOSIS — G620 Drug-induced polyneuropathy: Secondary | ICD-10-CM | POA: Diagnosis not present

## 2019-03-02 DIAGNOSIS — Z90722 Acquired absence of ovaries, bilateral: Secondary | ICD-10-CM | POA: Diagnosis not present

## 2019-03-02 DIAGNOSIS — J90 Pleural effusion, not elsewhere classified: Secondary | ICD-10-CM | POA: Diagnosis not present

## 2019-03-02 LAB — CBC WITH DIFFERENTIAL (CANCER CENTER ONLY)
Abs Immature Granulocytes: 0.03 10*3/uL (ref 0.00–0.07)
Basophils Absolute: 0 10*3/uL (ref 0.0–0.1)
Basophils Relative: 0 %
Eosinophils Absolute: 0 10*3/uL (ref 0.0–0.5)
Eosinophils Relative: 1 %
HCT: 33.5 % — ABNORMAL LOW (ref 36.0–46.0)
Hemoglobin: 10.6 g/dL — ABNORMAL LOW (ref 12.0–15.0)
Immature Granulocytes: 1 %
Lymphocytes Relative: 18 %
Lymphs Abs: 1.1 10*3/uL (ref 0.7–4.0)
MCH: 30.5 pg (ref 26.0–34.0)
MCHC: 31.6 g/dL (ref 30.0–36.0)
MCV: 96.5 fL (ref 80.0–100.0)
Monocytes Absolute: 0.4 10*3/uL (ref 0.1–1.0)
Monocytes Relative: 6 %
Neutro Abs: 4.6 10*3/uL (ref 1.7–7.7)
Neutrophils Relative %: 74 %
Platelet Count: 107 10*3/uL — ABNORMAL LOW (ref 150–400)
RBC: 3.47 MIL/uL — ABNORMAL LOW (ref 3.87–5.11)
RDW: 17.2 % — ABNORMAL HIGH (ref 11.5–15.5)
WBC Count: 6.1 10*3/uL (ref 4.0–10.5)
nRBC: 0 % (ref 0.0–0.2)

## 2019-03-02 LAB — CMP (CANCER CENTER ONLY)
ALT: 25 U/L (ref 0–44)
AST: 23 U/L (ref 15–41)
Albumin: 3.7 g/dL (ref 3.5–5.0)
Alkaline Phosphatase: 108 U/L (ref 38–126)
Anion gap: 9 (ref 5–15)
BUN: 20 mg/dL (ref 8–23)
CO2: 24 mmol/L (ref 22–32)
Calcium: 8.3 mg/dL — ABNORMAL LOW (ref 8.9–10.3)
Chloride: 110 mmol/L (ref 98–111)
Creatinine: 0.8 mg/dL (ref 0.44–1.00)
GFR, Est AFR Am: >60 mL/min (ref >60)
GFR, Estimated: >60 mL/min (ref >60)
Glucose, Bld: 116 mg/dL — ABNORMAL HIGH (ref 70–99)
Potassium: 3.5 mmol/L (ref 3.5–5.1)
Sodium: 143 mmol/L (ref 135–145)
Total Bilirubin: 0.7 mg/dL (ref 0.3–1.2)
Total Protein: 7.3 g/dL (ref 6.5–8.1)

## 2019-03-02 LAB — SAMPLE TO BLOOD BANK

## 2019-03-02 MED ORDER — SODIUM CHLORIDE 0.9% FLUSH
10.0000 mL | Freq: Once | INTRAVENOUS | Status: AC
  Administered 2019-03-02: 10 mL
  Filled 2019-03-02: qty 10

## 2019-03-02 MED ORDER — SODIUM CHLORIDE 0.9% FLUSH
10.0000 mL | INTRAVENOUS | Status: DC | PRN
  Administered 2019-03-02: 10 mL
  Filled 2019-03-02: qty 10

## 2019-03-02 MED ORDER — SODIUM CHLORIDE 0.9 % IV SOLN
37.5000 mg/m2 | Freq: Once | INTRAVENOUS | Status: AC
  Administered 2019-03-02: 70 mg via INTRAVENOUS
  Filled 2019-03-02: qty 7

## 2019-03-02 MED ORDER — PEGFILGRASTIM 6 MG/0.6ML ~~LOC~~ PSKT
6.0000 mg | PREFILLED_SYRINGE | Freq: Once | SUBCUTANEOUS | Status: AC
  Administered 2019-03-02: 6 mg via SUBCUTANEOUS

## 2019-03-02 MED ORDER — GABAPENTIN 300 MG PO CAPS: 900.0000 mg | ORAL_CAPSULE | Freq: Two times a day (BID) | ORAL | 11 refills | Status: DC

## 2019-03-02 MED ORDER — PEGFILGRASTIM 6 MG/0.6ML ~~LOC~~ PSKT
PREFILLED_SYRINGE | SUBCUTANEOUS | Status: AC
  Filled 2019-03-02: qty 0.6

## 2019-03-02 MED ORDER — SODIUM CHLORIDE 0.9 % IV SOLN
Freq: Once | INTRAVENOUS | Status: AC
  Filled 2019-03-02: qty 250

## 2019-03-02 MED ORDER — HYDROCODONE-ACETAMINOPHEN 5-325 MG PO TABS: 1.0000 | ORAL_TABLET | Freq: Four times a day (QID) | ORAL | 0 refills | Status: DC | PRN

## 2019-03-02 MED ORDER — HEPARIN SOD (PORK) LOCK FLUSH 100 UNIT/ML IV SOLN
500.0000 [IU] | Freq: Once | INTRAVENOUS | Status: AC | PRN
  Administered 2019-03-02: 500 [IU]
  Filled 2019-03-02: qty 5

## 2019-03-02 MED ORDER — SODIUM CHLORIDE 0.9 % IV SOLN
510.0000 mg | Freq: Once | INTRAVENOUS | Status: AC
  Administered 2019-03-02: 510 mg via INTRAVENOUS
  Filled 2019-03-02: qty 510

## 2019-03-02 MED ORDER — DEXAMETHASONE SODIUM PHOSPHATE 10 MG/ML IJ SOLN
INTRAMUSCULAR | Status: AC
  Filled 2019-03-02: qty 1

## 2019-03-02 MED ORDER — DEXAMETHASONE SODIUM PHOSPHATE 10 MG/ML IJ SOLN
5.0000 mg | Freq: Once | INTRAMUSCULAR | Status: AC
  Administered 2019-03-02: 5 mg via INTRAVENOUS

## 2019-03-03 ENCOUNTER — Encounter: Payer: Self-pay | Admitting: Hematology and Oncology

## 2019-03-03 DIAGNOSIS — Z299 Encounter for prophylactic measures, unspecified: Secondary | ICD-10-CM | POA: Insufficient documentation

## 2019-03-03 LAB — CA 125: Cancer Antigen (CA) 125: 36.3 U/mL (ref 0.0–38.1)

## 2019-03-03 NOTE — Assessment & Plan Note (Signed)
She had successful EGD and gastric banding recently to treat her esophageal varices Her liver function is acceptable

## 2019-03-03 NOTE — Assessment & Plan Note (Signed)
We discussed the paucity of data regarding the efficacy of Covid vaccine in patient who is receiving chemotherapy In her situation, due to history of neutropenic sepsis, she is also on G-CSF support We discussed the risk and benefits of Covid vaccination at this time If she desired to proceed with Covid vaccine, I recommend she received a dose a few days before her next cycle of treatment

## 2019-03-03 NOTE — Assessment & Plan Note (Signed)
The most likely cause of her anemia is due to chronic blood loss/malabsorption syndrome. We discussed some of the risks, benefits, and alternatives of intravenous iron infusions. The patient is symptomatic from anemia and the iron level is critically low. She tolerated oral iron supplement poorly and desires to achieved higher levels of iron faster for adequate hematopoesis. Some of the side-effects to be expected including risks of infusion reactions, phlebitis, headaches, nausea and fatigue.  The patient is willing to proceed. Patient education material was dispensed.  Goal is to keep ferritin level greater than 50 and resolution of anemia I have reviewed her EGD report which show she still have mild intermittent GI bleed She will continue close follow-up and monitoring through her gastroenterologist

## 2019-03-03 NOTE — Assessment & Plan Note (Signed)
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

## 2019-03-03 NOTE — Assessment & Plan Note (Addendum)
She noticed some worsening neuropathy while on treatment The dose of her Taxotere has already been reduced I do not recommend reducing her treatment further and she agreed We discussed the role of increasing the dose of gabapentin and I warned her about side effects and she agreed to proceed

## 2019-03-03 NOTE — Progress Notes (Signed)
[place] OFFICE PROGRESS NOTE  Patient Care Team: Gregor Hams, FNP as PCP - General (Family Medicine) Tat, Eustace Quail, DO as Consulting Physician (Neurology)  ASSESSMENT & PLAN:  Primary peritoneal carcinomatosis [facility]) So far, she tolerated treatment fairly well She had mild neuropathy but not worse She has no infectious complications We will proceed with treatment as scheduled I plan to order CT imaging next month for objective assessment of response to therapy So far, her tumor marker has improved  Iron deficiency anemia The most likely cause of her anemia is due to chronic blood loss/malabsorption syndrome. We discussed some of the risks, benefits, and alternatives of intravenous iron infusions. The patient is symptomatic from anemia and the iron level is critically low. She tolerated oral iron supplement poorly and desires to achieved higher levels of iron faster for adequate hematopoesis. Some of the side-effects to be expected including risks of infusion reactions, phlebitis, headaches, nausea and fatigue.  The patient is willing to proceed. Patient education material was dispensed.  Goal is to keep ferritin level greater than 50 and resolution of anemia I have reviewed her EGD report which show she still have mild intermittent GI bleed She will continue close follow-up and monitoring through her gastroenterologist  Diabetes mellitus with diabetic neuropathy, with long-term current use of insulin ([place]) We discussed the risk of poorly controlled diabetes and severe hyperglycemia with steroid therapy that is given as premedication before chemotherapy I have advised her to follow closely with her primary care doctor for medical management  Liver cirrhosis secondary to NASH [facility]) She had successful EGD and gastric banding recently to treat her esophageal varices Her liver function is acceptable  Low back pain She has multifactorial pain secondary to recent  procedure and her cancer I refill her prescription pain medicine We discussed narcotic refill policy  Chemotherapy-induced neuropathy ([place]) She noticed some worsening neuropathy while on treatment The dose of her Taxotere has already been reduced I do not recommend reducing her treatment further and she agreed We discussed the role of increasing the dose of gabapentin and I warned her about side effects and she agreed to proceed  Preventive measure We discussed the paucity of data regarding the efficacy of Covid vaccine in patient who is receiving chemotherapy In her situation, due to history of neutropenic sepsis, she is also on G-CSF support We discussed the risk and benefits of Covid vaccination at this time If she desired to proceed with Covid vaccine, I recommend she received a dose a few days before her next cycle of treatment   Orders Placed This Encounter  Procedures  . CT ABDOMEN PELVIS W CONTRAST    Standing Status:   Future    Standing Expiration Date:   03/01/2020    Order Specific Question:   If indicated for the ordered procedure, I authorize the administration of contrast media per Radiology protocol    Answer:   Yes    Order Specific Question:   Preferred imaging location?    Answer:   [facility]    Order Specific Question:   Radiology Contrast Protocol - do NOT remove file path    Answer:   [email or web address]    All questions were answered. The patient knows to call the clinic with any problems, questions or concerns. The total time spent in the appointment was 40 minutes encounter with patients including review of chart and various tests results, discussions about plan of care and coordination of care plan  [name], MD 03/03/2019 10:38 AM  INTERVAL HISTORY: Please see below for problem oriented charting. 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 Had allergic reaction to carboplatin. Avastin was stopped due to GI hemorrhage   Primary peritoneal carcinomatosis ([place])  09/06/2011 Imaging   CT findings consistent with cirrhotic changes involving the liver.  No worrisome liver mass.  There are associated portal venous collaterals and splenomegaly consistent with portal venous hypertension. 2.  No other significant upper abdominal findings   11/08/2013 Imaging   US showed ascites   11/08/2013 Initial Diagnosis   Patient had ~ 2 months of some urinary incontinence, saw Dr Ronita Hipps in 10-2013, with pelvic mass on exam    11/12/2013 Imaging   There are findings of progressive hepatic cirrhosis with a large amount of ascites and mild splenomegaly. There are venous collaterals present. 2. There is abnormal thickening of the peritoneal surface along the left mid and lower abdominal wall. There is abnormal soft tissue density in the pelvis as well which could reflect the clinically suspected ovarian malignancy but the findings are nondiagnostic. 3. There is a new small left pleural effusion. 4. There is no acute bowel abnormality.   11/18/2013 Imaging   Successful ultrasound guided diagnostic and therapeutic paracentesis yielding 4.1 liters of ascites.    11/18/2013 Pathology Results   PERITONEAL/ASCITIC FLUID (SPECIMEN 1 OF 1 COLLECTED 11/18/13): SEROUS CARCINOMA, PLEASE SEE COMMENT.   12/01/2013 Tumor Marker   Patient's tumor was tested for the following markers: CA125 Results of the tumor marker test revealed 1938   12/03/2013  Imaging   Successful ultrasound-guided therapeutic paracentesis yielding 3.6 liters of peritoneal fluid.   12/07/2013 - 05/30/2014 Chemotherapy   She received 3 cycles of carboplatin and Taxol, interrupted cycle 4 for surgery.  She subsequently completed 3 more cycles of chemotherapy after surgery   12/20/2013 Imaging   Successful ultrasound-guided diagnostic and therapeutic paracentesis yielding 2.2 liters of peritoneal fluid   12/20/2013 Pathology Results   PERITONEAL/ASCITIC FLUID(SPECIMEN 1 OF 1 COLLECTED 12/20/13): MALIGNANT CELLS CONSISTENT WITH SEROUS CARCINOMA   01/13/2014 Tumor Marker   Patient's tumor was tested for the following markers: CA125 Results of the tumor marker test revealed 227   02/07/2014 Tumor Marker   Patient's tumor was tested for the following markers: CA125 Results of the tumor marker test revealed 130   02/15/2014 Genetic Testing   Genetics testing from 02-2014 normal ([employer] panel)   02/23/2014 Imaging   Interval decrease in ascites. 2. Morphologic changes in the liver consistent with cirrhosis. 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 Portal vein remains patent at this time. 3. Persistent but decreased abnormal soft tissue attenuation tracking in the omental fat. This may be secondary to interval improvement in metastatic disease. 4. Peritoneal thickening seen in the left abdomen and pelvis on the previous study has decreased and nearly resolved in the interval. This also suggests interval improvement in metastatic disease.    03/07/2014 Procedure   Ultrasound and fluoroscopically guided right internal jugular single lumen power port catheter insertion.  Tip in the SVC/RA junction. Catheter ready for use.   03/17/2014 Tumor Marker   Patient's tumor was tested for the following markers: CA125 Results of the tumor marker test revealed 45   03/29/2014 Pathology Results   1. Omentum, resection for tumor - HIGH GRADE SEROUS  CARCINOMA, SEE COMMENT. 2. Ovary and fallopian tube, right - HIGH GRADE SEROUS CARCINOMA, SEE COMMENT. 3. Ovary and fallopian tube, left - HIGH GRADE SEROUS CARCINOMA, SEE COMMENT. Diagnosis Note 1. Nests and clusters of malignant cells are invading the omental tissue (part #1) with associated fibrosis. The cells are pleomorphic with prominent nucleoli. There are scattered psammoma bodies. The ovaries are atrophic and exhibit multiple foci of surface based invasive carcinoma and associated fibrosis. The fallopian tubes have a few foci of carcinoma, also superficially located. There are no precursor lesions noted in the ovaries or fallopian tubes (the entire tubes and ovaries were submitted for evaluation). While there is some retraction artifact, there are several foci suspicious for lymphovascular invasion. 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 Given the fibrosis, there does appear to be a small amount of treatment effect, however, there is abundant residual tumor.    03/29/2014 Surgery   Procedure(s) Performed: 1. Exploratory laparotomy with bilateral salpingo-oophorectomy, omentectomy radical tumor debulking for ovarian cancer .  Surgeon: Thereasa Solo, MD.  Assistant Surgeon: Lahoma Crocker, M.D. Assistant: (an MD assistant was necessary for tissue manipulation, retraction and positioning due to the complexity of the case and hospital policies).  Operative Findings: 10cm omental cake from hepatic to splenic flexure, densely adherent to transverse colon. Milliary studding of tumor implants (<79m, too numerous in number to count) adherent to the mesentery of the small bowel and small bowel wall. Small volume (100cc) ascites. Small ovaries bilaterally, densely adherent to the pelvic cul de sac. Left ovary and tube densely adherent to sigmoid colon. Cirrhotic  liver with hepatomegaly and splenomegaly.     This represented an optimal cytoreduction (R1) with visible disease residual on the bowel wall and mesentery (millial, <334mimplants).     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 2. 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    08/18/2014 Tumor Marker   Patient's tumor was tested for the following markers: CA125 Results of the tumor marker test revealed 16   10/13/2014 Tumor Marker   Patient's tumor was tested for the following markers: CA125 Results of the tumor marker test revealed 14   11/21/2014 Tumor Marker   Patient's tumor was tested for the following markers: CA125 Results of the tumor marker test revealed 16   12/28/2014 Tumor Marker   Patient's tumor was tested for the following markers: CA125 Results of the tumor marker test revealed 762   02/27/2015 Tumor Marker   Patient's tumor was tested for the following markers: CA125 Results of the tumor marker test revealed 20.2   03/22/2015 Imaging  Filler appearance to the prior exam, with very subtle fluid tracking along portions of the liver, and very minimal nodularity along the right paracolic gutter representing residua from the prior peritoneal cancer. The current abnormalities could simply be post therapy findings rather than necessarily representing residual malignancy. No new or enlarging lesions are identified. 2. Hepatic cirrhosis and splenomegaly. There is some gastric varices suggesting  portal venous hypertension. 3. Left foraminal stenosis at L4-5 due to spurring. There is likely also some central narrowing of the thecal sac at this level. 4. Bibasilar scarring. 5. Chronic mild left mid kidney scarring   03/27/2015 Tumor Marker   Patient's tumor was tested for the following markers: CA125 Results of the tumor marker test revealed 25.3   04/24/2015 Imaging   Disc bulge L2-3 with mild spinal stenosis and right foraminal encroachment. 2. Disc bulge L3-4 with mild spinal stenosis. 3. Multifactorial spinal, left lateral recess and foraminal stenosis L4-5. There is grade 1 anterolisthesis without evident dynamic instability.   05/29/2015 Tumor Marker   Patient's tumor was tested for the following markers: CA125 Results of the tumor marker test revealed 29.9   08/21/2015 Tumor Marker   Patient's tumor was tested for the following markers: CA125 Results of the tumor marker test revealed 45   09/18/2015 Tumor Marker   Patient's tumor was tested for the following markers: CA125 Results of the tumor marker test revealed 47   09/27/2015 Imaging   New small bowel mesenteric nodule and enlarged left external iliac lymph node, highly worrisome for metastatic disease. 2. Cirrhosis and splenomegaly   10/10/2015 Tumor Marker   Patient's tumor was tested for the following markers: CA125 Results of the tumor marker test revealed 53.4   10/10/2015 - 12/11/2015 Chemotherapy   She received 4 cycles of carboplatin only   11/20/2015 Tumor Marker   Patient's tumor was tested for the following markers: CA125 Results of the tumor marker test revealed 41.3   12/20/2015 Imaging   CT: Mild left external iliac lymphadenopathy is slightly decreased. Mildly enlarged lower mesenteric nodule is slightly decreased. 2. No new or progressive metastatic disease in the abdomen or pelvis. 3. Cirrhosis. Stable subcentimeter hypodense left liver lobe lesion. No new liver lesions. 4. Stable mild splenomegaly.  No  ascites. 5. Mild sigmoid diverticulosis.   01/01/2016 -  Anti-estrogen oral therapy   She was placed on tamoxifen. Had letrozole initially but switched to tamoxifen due to poor tolerance   01/15/2016 Tumor Marker   Patient's tumor was tested for the following markers: CA125 Results of the tumor marker test revealed 31.4   02/19/2016 Tumor Marker   Patient's tumor was tested for the following markers: CA125 Results of the tumor marker test revealed 36.5   05/13/2016 Imaging   No evidence of disease progression within the abdomen or pelvis. Previously noted small central mesenteric nodule and left external iliac node appear slightly smaller. 2. Stable changes of hepatic cirrhosis and portal hypertension with associated splenomegaly. No new or enlarging hepatic lesions are identified. 3. No acute findings.   05/13/2016 Tumor Marker   Patient's tumor was tested for the following markers: CA125 Results of the tumor marker test revealed 41.2   06/24/2016 Tumor Marker   Patient's tumor was tested for the following markers: CA125 Results of the tumor marker test revealed 47.3   08/20/2016 Imaging   1. The left external iliac lymph node is slightly increased in size compared to the prior exam, currently 1.1 cm and previously 0.9 cm in diameter.  2. Stable central mesenteric lymph node at 0.9 cm in diameter. 3. Stable trace free pelvic fluid and stable slight thickening along the right paracolic gutter, without well-defined peritoneal nodularity. 4. Other imaging findings of potential clinical significance: Subsegmental atelectasis or scarring in the lung bases. Hepatic cirrhosis. Left renal scarring. Mild splenomegaly. Sigmoid colon diverticulosis. Impingement at L4-5 due to spondylosis and degenerative disc disease broad Schmorl' s nodes at L3-L4. Pelvic floor laxity   08/23/2016 Tumor Marker   Patient's tumor was tested for the following markers: CA125 Results of the tumor marker test revealed 80.2    09/04/2016 Imaging   LV EF: 60% -   65%   09/12/2016 Tumor Marker   Patient's tumor was tested for the following markers: CA125 Results of the tumor marker test revealed 98.5   09/12/2016 - 01/29/2018 Chemotherapy   The patient received Doxil and Avastin. Avastin was discontinued in Dec 2019 due to GI hemorrhage   09/26/2016 Tumor Marker   Patient's tumor was tested for the following markers: CA125 Results of the tumor marker test revealed 68.9   10/10/2016 Tumor Marker   Patient's tumor was tested for the following markers: CA125 Results of the tumor marker test revealed 65.6   11/07/2016 Tumor Marker   Patient's tumor was tested for the following markers: CA125 Results of the tumor marker test revealed 46.4   11/29/2016 Imaging   Stable mild peritoneal thickening, suspicious for peritoneal carcinomatosis. No new or progressive disease identified. No evidence of ascites.  No significant change in 10 mm left external iliac and 8 mm mesenteric lymph nodes.  Stable hepatic cirrhosis, and splenomegaly consistent with portal venous hypertension.  Colonic diverticulosis, without radiographic evidence of diverticulitis.   12/02/2016 Imaging   Normal LV size with EF 55%. Basal inferior and basal inferoseptal hypokinesis. Normal RV size and systolic function. No significant valvular abnormalities.   12/05/2016 Tumor Marker   Patient's tumor was tested for the following markers: CA125 Results of the tumor marker test revealed 49.1   01/09/2017 Tumor Marker   Patient's tumor was tested for the following markers: CA125 Results of the tumor marker test revealed 47.2   02/06/2017 Tumor Marker   Patient's tumor was tested for the following markers: CA125 Results of the tumor marker test revealed 44.1   02/18/2017 Imaging   Stable mild peritoneal thickening. No new or progressive disease identified within the abdomen or pelvis.  Stable tiny sub-cm left external iliac and mesenteric lymph  nodes.  Stable hepatic cirrhosis, and splenomegaly consistent with portal venous hypertension. No evidence of hepatic neoplasm.  Colonic diverticulosis, without radiographic evidence of diverticulitis.   03/06/2017 Tumor Marker   Patient's tumor was tested for the following markers: CA125 Results of the tumor marker test revealed 50.4   03/17/2017 Imaging   - Left ventricle: The cavity size was normal. Wall thickness was normal. Systolic function was normal. The estimated ejection fraction was in the range of 55% to 60%. Wall motion was normal; there were no regional wall motion abnormalities. Features are consistent with a pseudonormal left ventricular filling pattern, with concomitant abnormal relaxation and increased filling pressure (grade 2 diastolic dysfunction). - Mitral valve: There was mild regurgitation. - Left atrium: The atrium was mildly dilated   04/03/2017 Tumor Marker   Patient's tumor was tested for the following markers: CA125 Results of the tumor marker test revealed 47.2   05/14/2017 Imaging   CT scan of abdomen and pelvis Stable mild peritoneal thickening. No new or progressive disease  within the abdomen or pelvis.  Stable hepatic cirrhosis. Stable splenomegaly, consistent with portal venous hypertension. No evidence of hepatic neoplasm.   05/15/2017 Tumor Marker   Patient's tumor was tested for the following markers: CA125 Results of the tumor marker test revealed 56.1   06/12/2017 Tumor Marker   Patient's tumor was tested for the following markers: CA125 Results of the tumor marker test revealed 49.2   07/10/2017 Tumor Marker   Patient's tumor was tested for the following markers: CA125 Results of the tumor marker test revealed 46.3   08/07/2017 Tumor Marker   Patient's tumor was tested for the following markers: CA125 Results of the tumor marker test revealed 52.9   08/20/2017 Imaging   1. Mild omental/peritoneal haziness, unchanged. No evidence of new metastatic  disease. 2. Cirrhosis with splenomegaly.   09/04/2017 Tumor Marker   Patient's tumor was tested for the following markers: CA125 Results of the tumor marker test revealed 48.5   11/13/2017 Tumor Marker   Patient's tumor was tested for the following markers: CA125 Results of the tumor marker test revealed 55.8   12/17/2017 Imaging   1. No findings to suggest metastatic disease in the abdomen or pelvis. 2. Severe hepatic cirrhosis with evidence of portal hypertension, as demonstrated by dilated portal vein, splenomegaly and portosystemic collateral pathways, including gastric and esophageal varices.  3. Colonic diverticulosis without evidence of acute diverticulitis at this time. 4. Additional incidental findings, as above.   01/16/2018 Tumor Marker   Patient's tumor was tested for the following markers: CA125 Results of the tumor marker test revealed 63.7   02/10/2018 Procedure   EGD - Recently bleeding grade III and large (> 5 mm) esophageal varices. Completely eradicated. Banded. - Portal hypertensive gastropathy. - Normal examined duodenum. - No specimens collected   02/13/2018 Tumor Marker   Patient's tumor was tested for the following markers: CA125 Results of the tumor marker test revealed 82.7   02/25/2018 Imaging   Bone density showed mild osteopenia   04/08/2018 Tumor Marker   Patient's tumor was tested for the following markers: CA125 Results of the tumor marker test revealed 82.7   04/08/2018 Imaging   1. No definite omental or peritoneal surface lesions. However, there are 2 slowly enlarging lymph nodes noted. One is in the small bowel mesentery and the other is in the left deep pelvis. Could not exclude recurrent disease. 2. Stable advanced cirrhotic changes involving the liver with portal venous hypertension, portal venous collaterals, esophageal varices and splenomegaly. No worrisome hepatic lesions. 3. Diffuse wall thickening of the colon could suggest diffuse  colitis or could be due to low albumin. Recommend correlation with any symptoms such as diarrhea.   05/21/2018 Tumor Marker   Patient's tumor was tested for the following markers: CA125 Results of the tumor marker test revealed 86   09/07/2018 Tumor Marker   Patient's tumor was tested for the following markers:CA-125 Results of the tumor marker test revealed 111   09/29/2018 Tumor Marker   Patient's tumor was tested for the following markers: CA-125 Results of the tumor marker test revealed 121.   09/29/2018 - 11/24/2018 Chemotherapy   The patient had carboplatin and gemzar for chemotherapy treatment.  Chemo was stopped due to allergic reaction to carboplatin   10/27/2018 Tumor Marker   Patient's tumor was tested for the following markers: CA-125 Results of the tumor marker test revealed 99.9   11/24/2018 Tumor Marker   Patient's tumor was tested for the following markers: CA-125 Results of the  tumor marker test revealed 81.2   12/14/2018 Tumor Marker   Patient's tumor was tested for the following markers: CA-125 Results of the tumor marker test revealed 81.2   12/14/2018 Imaging   1. Mild improvement in peritoneal disease. Peritoneal nodule within the left posterior pelvis contiguous with the left side of vaginal cuff is slightly decreased in size in the interval. Within the right iliac fossa there is a small peritoneal nodule which has decreased in size in the interval. Central small bowel mesenteric lymph node is not significantly changed. Slight decrease in size left periaortic lymph node.    2. Morphologic features of the liver compatible with cirrhosis. Splenomegaly is noted which may reflect portal venous hypertension.   12/21/2018 Tumor Marker   Patient's tumor was tested for the following markers: CA-125 Results of the tumor marker test revealed 64.2   12/21/2018 -  Chemotherapy   The patient had taxotere for chemotherapy treatment.     03/02/2019 Tumor Marker   Patient's  tumor was tested for the following markers: CA-125. Results of the tumor marker test revealed 36.3     REVIEW OF SYSTEMS:   Constitutional: Denies fevers, chills or abnormal weight loss Eyes: Denies blurriness of vision Ears, nose, mouth, throat, and face: Denies mucositis or sore throat Respiratory: Denies cough, dyspnea or wheezes Cardiovascular: Denies palpitation, chest discomfort or lower extremity swelling Gastrointestinal:  Denies nausea, heartburn or change in bowel habits Skin: Denies abnormal skin rashes Lymphatics: Denies new lymphadenopathy or easy bruising Behavioral/Psych: Mood is stable, no new changes  All other systems were reviewed with the patient and are negative.  I have reviewed the past medical history, past surgical history, social history and family history with the patient and they are unchanged from previous note.  ALLERGIES:  is allergic to carboplatin; shellfish allergy; vancomycin; benadryl [diphenhydramine hcl]; penicillins; and tape.  MEDICATIONS:  Current Outpatient Medications  Medication Sig Dispense Refill  . cholecalciferol (VITAMIN D) 1000 units tablet Take 1,000 Units by mouth at bedtime.     Marland Kitchen dexamethasone (DECADRON) 4 MG tablet Start the day before Taxotere, take 1 tablet in the morning, and then 1 tablet daily after chemo for 2 days. Pls dispense 30 tabs (Patient taking differently: Take 4 mg by mouth See admin instructions. Start the day before Taxotere, take 1 tablet in the morning, and then 1 tablet daily after chemo for 2 days. Pls dispense 30 tabs) 30 tablet 1  . empagliflozin (JARDIANCE) 10 MG TABS tablet Take 10 mg by mouth daily.    Marland Kitchen gabapentin (NEURONTIN) 300 MG capsule Take 3 capsules (900 mg total) by mouth 2 (two) times daily. 120 capsule 11  . HUMALOG MIX 75/25 KWIKPEN (75-25) 100 UNIT/ML Kwikpen Inject 10 Units into the skin 2 (two) times daily. Before breakfast & before supper    . HYDROcodone-acetaminophen (NORCO/VICODIN) 5-325  MG tablet Take 1 tablet by mouth every 6 (six) hours as needed for moderate pain. 60 tablet 0  . lidocaine-prilocaine (EMLA) cream Apply to Porta-cath  1-2 hours prior to access as directed. (Patient taking differently: Apply 1 application topically daily as needed (port access). ) 30 g 9  . magic mouthwash w/lidocaine SOLN Take 5 mLs by mouth every 4 (four) hours as needed for mouth pain (Swish & spit).     . nadolol (CORGARD) 20 MG tablet Take 1 tablet (20 mg total) by mouth daily. (Patient taking differently: Take 20 mg by mouth at bedtime. ) 90 tablet 3  . ondansetron (  ZOFRAN) 8 MG tablet Take 1 tablet (8 mg total) by mouth every 8 (eight) hours as needed. Start on the third day after chemotherapy. 30 tablet 1  . pantoprazole (PROTONIX) 40 MG tablet Take 1 tablet (40 mg total) by mouth daily.    . prochlorperazine (COMPAZINE) 10 MG tablet Take 1 tablet (10 mg total) by mouth every 6 (six) hours as needed (Nausea or vomiting). 30 tablet 1  . rotigotine (NEUPRO) 4 MG/24HR Place 1 patch onto the skin daily. 30 patch 7  . sucralfate (CARAFATE) 1 GM/10ML suspension Take 10 mLs (1 g total) by mouth 2 (two) times daily for 7 days. 420 mL 1   No current facility-administered medications for this visit.    PHYSICAL EXAMINATION: ECOG PERFORMANCE STATUS: 1 - Symptomatic but completely ambulatory  Vitals:   03/02/19 1138  BP: 139/63  Pulse: 67  Resp: 18  Temp: 98.3 F (36.8 C)  SpO2: 100%   Filed Weights   03/02/19 1138  Weight: 187 lb 3.2 oz (84.9 kg)    GENERAL:alert, no distress and comfortable SKIN: skin color, texture, turgor are normal, no rashes or significant lesions EYES: normal, Conjunctiva are pink and non-injected, sclera clear OROPHARYNX:no exudate, no erythema and lips, buccal mucosa, and tongue normal  NECK: supple, thyroid normal size, non-tender, without nodularity LYMPH:  no palpable lymphadenopathy in the cervical, axillary or inguinal LUNGS: clear to auscultation and  percussion with normal breathing effort HEART: regular rate & rhythm and no murmurs and no lower extremity edema ABDOMEN:abdomen soft, non-tender and normal bowel sounds Musculoskeletal:no cyanosis of digits and no clubbing  NEURO: alert & oriented x 3 with fluent speech, no focal motor/sensory deficits  LABORATORY DATA:  I have reviewed the data as listed    Component Value Date/Time   NA 143 03/02/2019 1031   NA 142 02/06/2017 0742   K 3.5 03/02/2019 1031   K 3.9 02/06/2017 0742   CL 110 03/02/2019 1031   CO2 24 03/02/2019 1031   CO2 22 02/06/2017 0742   GLUCOSE 116 (H) 03/02/2019 1031   GLUCOSE 156 (H) 02/06/2017 0742   BUN 20 03/02/2019 1031   BUN 13.5 02/06/2017 0742   CREATININE 0.80 03/02/2019 1031   CREATININE 0.8 02/06/2017 0742   CALCIUM 8.3 (L) 03/02/2019 1031   CALCIUM 8.5 02/06/2017 0742   PROT 7.3 03/02/2019 1031   PROT 6.9 02/06/2017 0742   ALBUMIN 3.7 03/02/2019 1031   ALBUMIN 3.7 02/06/2017 0742   AST 23 03/02/2019 1031   AST 47 (H) 02/06/2017 0742   ALT 25 03/02/2019 1031   ALT 54 02/06/2017 0742   ALKPHOS 108 03/02/2019 1031   ALKPHOS 130 02/06/2017 0742   BILITOT 0.7 03/02/2019 1031   BILITOT 0.65 02/06/2017 0742   GFRNONAA >60 03/02/2019 1031   GFRAA >60 03/02/2019 1031    No results found for: SPEP, UPEP  Lab Results  Component Value Date   WBC 6.1 03/02/2019   NEUTROABS 4.6 03/02/2019   HGB 10.6 (L) 03/02/2019   HCT 33.5 (L) 03/02/2019   MCV 96.5 03/02/2019   PLT 107 (L) 03/02/2019      Chemistry      Component Value Date/Time   NA 143 03/02/2019 1031   NA 142 02/06/2017 0742   K 3.5 03/02/2019 1031   K 3.9 02/06/2017 0742   CL 110 03/02/2019 1031   CO2 24 03/02/2019 1031   CO2 22 02/06/2017 0742   BUN 20 03/02/2019 1031   BUN 13.5 02/06/2017  9507   CREATININE 0.80 03/02/2019 1031   CREATININE 0.8 02/06/2017 0742      Component Value Date/Time   CALCIUM 8.3 (L) 03/02/2019 1031   CALCIUM 8.5 02/06/2017 0742   ALKPHOS 108  03/02/2019 1031   ALKPHOS 130 02/06/2017 0742   AST 23 03/02/2019 1031   AST 47 (H) 02/06/2017 0742   ALT 25 03/02/2019 1031   ALT 54 02/06/2017 0742   BILITOT 0.7 03/02/2019 1031   BILITOT 0.65 02/06/2017 0742     I have reviewed her recent EGD report.

## 2019-03-03 NOTE — Assessment & Plan Note (Signed)
We discussed the risk of poorly controlled diabetes and severe hyperglycemia with steroid therapy that is given as premedication before chemotherapy I have advised her to follow closely with her primary care doctor for medical management

## 2019-03-03 NOTE — Assessment & Plan Note (Signed)
She has multifactorial pain secondary to recent procedure and her cancer I refill her prescription pain medicine We discussed narcotic refill policy

## 2019-03-04 ENCOUNTER — Telehealth: Payer: Self-pay | Admitting: Hematology and Oncology

## 2019-03-04 NOTE — Telephone Encounter (Signed)
Scheduled appt per 1/20 sch message - pt aware of appt date and time

## 2019-03-09 ENCOUNTER — Telehealth: Payer: Self-pay

## 2019-03-09 NOTE — Telephone Encounter (Signed)
I just spoke with them

## 2019-03-09 NOTE — Telephone Encounter (Signed)
Olivia Benson at Dr. Alvin Critchley office called and left a message. Olivia Benson is in the dentist office now. She broke a tooth and needs to have a restoration. Is she okay to have the procedure from Dr. Alvy Bimler stand point?

## 2019-03-22 ENCOUNTER — Other Ambulatory Visit: Payer: Self-pay

## 2019-03-22 ENCOUNTER — Ambulatory Visit (HOSPITAL_COMMUNITY)
Admission: RE | Admit: 2019-03-22 | Discharge: 2019-03-22 | Disposition: A | Discharge status: Home / Self Care | Payer: Medicare Other | Source: Ambulatory Visit | Attending: Hematology and Oncology | Admitting: Hematology and Oncology

## 2019-03-22 ENCOUNTER — Inpatient Hospital Stay: Payer: Medicare Other | Attending: Hematology and Oncology

## 2019-03-22 ENCOUNTER — Inpatient Hospital Stay: Payer: Medicare Other

## 2019-03-22 DIAGNOSIS — T451X5A Adverse effect of antineoplastic and immunosuppressive drugs, initial encounter: Secondary | ICD-10-CM

## 2019-03-22 DIAGNOSIS — K7469 Other cirrhosis of liver: Secondary | ICD-10-CM | POA: Diagnosis not present

## 2019-03-22 DIAGNOSIS — K7581 Nonalcoholic steatohepatitis (NASH): Secondary | ICD-10-CM | POA: Diagnosis not present

## 2019-03-22 DIAGNOSIS — C482 Malignant neoplasm of peritoneum, unspecified: Secondary | ICD-10-CM

## 2019-03-22 DIAGNOSIS — Z7189 Other specified counseling: Secondary | ICD-10-CM

## 2019-03-22 DIAGNOSIS — D696 Thrombocytopenia, unspecified: Secondary | ICD-10-CM | POA: Insufficient documentation

## 2019-03-22 DIAGNOSIS — Z5111 Encounter for antineoplastic chemotherapy: Secondary | ICD-10-CM | POA: Diagnosis not present

## 2019-03-22 DIAGNOSIS — D509 Iron deficiency anemia, unspecified: Secondary | ICD-10-CM | POA: Diagnosis not present

## 2019-03-22 DIAGNOSIS — Z79899 Other long term (current) drug therapy: Secondary | ICD-10-CM | POA: Insufficient documentation

## 2019-03-22 DIAGNOSIS — D61810 Antineoplastic chemotherapy induced pancytopenia: Secondary | ICD-10-CM

## 2019-03-22 DIAGNOSIS — C569 Malignant neoplasm of unspecified ovary: Secondary | ICD-10-CM

## 2019-03-22 DIAGNOSIS — Z9221 Personal history of antineoplastic chemotherapy: Secondary | ICD-10-CM | POA: Diagnosis not present

## 2019-03-22 LAB — SAMPLE TO BLOOD BANK

## 2019-03-22 LAB — CMP (CANCER CENTER ONLY)
ALT: 35 U/L (ref 0–44)
AST: 32 U/L (ref 15–41)
Albumin: 3.9 g/dL (ref 3.5–5.0)
Alkaline Phosphatase: 134 U/L — ABNORMAL HIGH (ref 38–126)
Anion gap: 10 (ref 5–15)
BUN: 18 mg/dL (ref 8–23)
CO2: 24 mmol/L (ref 22–32)
Calcium: 8.7 mg/dL — ABNORMAL LOW (ref 8.9–10.3)
Chloride: 106 mmol/L (ref 98–111)
Creatinine: 0.91 mg/dL (ref 0.44–1.00)
GFR, Est AFR Am: >60 mL/min (ref >60)
GFR, Estimated: >60 mL/min (ref >60)
Glucose, Bld: 192 mg/dL — ABNORMAL HIGH (ref 70–99)
Potassium: 4.2 mmol/L (ref 3.5–5.1)
Sodium: 140 mmol/L (ref 135–145)
Total Bilirubin: 0.9 mg/dL (ref 0.3–1.2)
Total Protein: 7.4 g/dL (ref 6.5–8.1)

## 2019-03-22 LAB — CBC WITH DIFFERENTIAL (CANCER CENTER ONLY)
Abs Immature Granulocytes: 0.03 10*3/uL (ref 0.00–0.07)
Basophils Absolute: 0 10*3/uL (ref 0.0–0.1)
Basophils Relative: 0 %
Eosinophils Absolute: 0 10*3/uL (ref 0.0–0.5)
Eosinophils Relative: 0 %
HCT: 36.7 % (ref 36.0–46.0)
Hemoglobin: 11.6 g/dL — ABNORMAL LOW (ref 12.0–15.0)
Immature Granulocytes: 1 %
Lymphocytes Relative: 12 %
Lymphs Abs: 0.5 10*3/uL — ABNORMAL LOW (ref 0.7–4.0)
MCH: 30.8 pg (ref 26.0–34.0)
MCHC: 31.6 g/dL (ref 30.0–36.0)
MCV: 97.3 fL (ref 80.0–100.0)
Monocytes Absolute: 0.1 10*3/uL (ref 0.1–1.0)
Monocytes Relative: 2 %
Neutro Abs: 3.8 10*3/uL (ref 1.7–7.7)
Neutrophils Relative %: 85 %
Platelet Count: 89 10*3/uL — ABNORMAL LOW (ref 150–400)
RBC: 3.77 MIL/uL — ABNORMAL LOW (ref 3.87–5.11)
RDW: 17.7 % — ABNORMAL HIGH (ref 11.5–15.5)
WBC Count: 4.5 10*3/uL (ref 4.0–10.5)
nRBC: 0 % (ref 0.0–0.2)

## 2019-03-22 MED ORDER — IOHEXOL 300 MG/ML  SOLN
100.0000 mL | Freq: Once | INTRAMUSCULAR | Status: AC | PRN
  Administered 2019-03-22: 100 mL via INTRAVENOUS

## 2019-03-22 MED ORDER — IOHEXOL 9 MG/ML PO SOLN
ORAL | Status: AC
  Administered 2019-03-22: 500 mL
  Filled 2019-03-22: qty 1000

## 2019-03-22 MED ORDER — SODIUM CHLORIDE (PF) 0.9 % IJ SOLN
INTRAMUSCULAR | Status: AC
  Filled 2019-03-22: qty 50

## 2019-03-22 MED ORDER — HEPARIN SOD (PORK) LOCK FLUSH 100 UNIT/ML IV SOLN
500.0000 [IU] | Freq: Once | INTRAVENOUS | Status: AC
  Administered 2019-03-22: 12:00:00 500 [IU]

## 2019-03-22 MED ORDER — HEPARIN SOD (PORK) LOCK FLUSH 100 UNIT/ML IV SOLN
INTRAVENOUS | Status: AC
  Filled 2019-03-22: qty 5

## 2019-03-22 NOTE — Patient Instructions (Signed)

## 2019-03-23 ENCOUNTER — Inpatient Hospital Stay (HOSPITAL_BASED_OUTPATIENT_CLINIC_OR_DEPARTMENT_OTHER): Payer: Medicare Other | Admitting: Hematology and Oncology

## 2019-03-23 ENCOUNTER — Other Ambulatory Visit: Payer: Self-pay

## 2019-03-23 ENCOUNTER — Telehealth: Payer: Self-pay

## 2019-03-23 ENCOUNTER — Inpatient Hospital Stay: Payer: Medicare Other

## 2019-03-23 ENCOUNTER — Encounter: Payer: Self-pay | Admitting: Hematology and Oncology

## 2019-03-23 DIAGNOSIS — K7460 Unspecified cirrhosis of liver: Secondary | ICD-10-CM

## 2019-03-23 DIAGNOSIS — Z5111 Encounter for antineoplastic chemotherapy: Secondary | ICD-10-CM | POA: Diagnosis not present

## 2019-03-23 DIAGNOSIS — K7581 Nonalcoholic steatohepatitis (NASH): Secondary | ICD-10-CM | POA: Diagnosis not present

## 2019-03-23 DIAGNOSIS — D509 Iron deficiency anemia, unspecified: Secondary | ICD-10-CM

## 2019-03-23 DIAGNOSIS — Z7189 Other specified counseling: Secondary | ICD-10-CM

## 2019-03-23 DIAGNOSIS — D696 Thrombocytopenia, unspecified: Secondary | ICD-10-CM

## 2019-03-23 DIAGNOSIS — C482 Malignant neoplasm of peritoneum, unspecified: Secondary | ICD-10-CM

## 2019-03-23 MED ORDER — SODIUM CHLORIDE 0.9 % IV SOLN
Freq: Once | INTRAVENOUS | Status: AC
  Filled 2019-03-23: qty 250

## 2019-03-23 MED ORDER — PEGFILGRASTIM 6 MG/0.6ML ~~LOC~~ PSKT
PREFILLED_SYRINGE | SUBCUTANEOUS | Status: AC
  Filled 2019-03-23: qty 0.6

## 2019-03-23 MED ORDER — SODIUM CHLORIDE 0.9% FLUSH
10.0000 mL | INTRAVENOUS | Status: DC | PRN
  Administered 2019-03-23: 10 mL
  Filled 2019-03-23: qty 10

## 2019-03-23 MED ORDER — PEGFILGRASTIM 6 MG/0.6ML ~~LOC~~ PSKT
6.0000 mg | PREFILLED_SYRINGE | Freq: Once | SUBCUTANEOUS | Status: AC
  Administered 2019-03-23: 6 mg via SUBCUTANEOUS

## 2019-03-23 MED ORDER — DEXAMETHASONE SODIUM PHOSPHATE 10 MG/ML IJ SOLN
5.0000 mg | Freq: Once | INTRAMUSCULAR | Status: AC
  Administered 2019-03-23: 5 mg via INTRAVENOUS

## 2019-03-23 MED ORDER — DEXAMETHASONE SODIUM PHOSPHATE 10 MG/ML IJ SOLN
INTRAMUSCULAR | Status: AC
  Filled 2019-03-23: qty 1

## 2019-03-23 MED ORDER — SODIUM CHLORIDE 0.9 % IV SOLN
37.5000 mg/m2 | Freq: Once | INTRAVENOUS | Status: AC
  Administered 2019-03-23: 70 mg via INTRAVENOUS
  Filled 2019-03-23: qty 7

## 2019-03-23 MED ORDER — HEPARIN SOD (PORK) LOCK FLUSH 100 UNIT/ML IV SOLN
500.0000 [IU] | Freq: Once | INTRAVENOUS | Status: AC | PRN
  Administered 2019-03-23: 500 [IU]
  Filled 2019-03-23: qty 5

## 2019-03-23 NOTE — Assessment & Plan Note (Signed)
She has received intravenous iron recently with improvement of anemia I recommend holding off further intravenous iron infusion as it will cause fall of her platelet count through removal of the compensatory thrombocytosis

## 2019-03-23 NOTE — Telephone Encounter (Signed)
-----   Message from [name], MD sent at 03/23/2019  3:46 PM EST ----- Regarding: RE: Dental procedure Yes she may proceed ----- Message ----- From: Flo Shanks, RN Sent: 03/23/2019   3:44 PM EST To: [name], MD Subject: Dental procedure                               Dr. Thereasa Parkin office called back. Dr. Thereasa Parkin is asking if you approve or disapprove of the dental procedure?   Thanks

## 2019-03-23 NOTE — Progress Notes (Signed)
[facility] OFFICE PROGRESS NOTE  Patient Care Team: Gregor Hams, FNP as PCP - General (Family Medicine) Tat, Eustace Quail, DO as Consulting Physician (Neurology)  ASSESSMENT & PLAN:  Primary peritoneal carcinomatosis [facility]) I have reviewed multiple CT imaging with the patient I have also reviewed results of her tumor marker which is now improved to within normal limits Clinically, she had positive response to treatment despite significant dose adjustment and aggressive supportive care I recommend we proceed with 4 more cycles of treatment and repeat imaging study in 3 months, due around May of this year  Liver cirrhosis secondary to NASH [facility]) She is known to have liver cirrhosis On her most recent CT imaging, the radiologist noted a 1 cm indeterminate lesion on the left lobe of her liver I reviewed her last few imaging studies and there is a shadow in that region [facility] cannot be excluded However, given the scope of things, I do not know if ordering MRI is going to be helpful as she is not a surgical candidate Also, with recurrent intravenous iron infusions, it could render MRI in accurate Ultimately, she is comfortable to be observed only as she will be due for another CT imaging in 3 months  Iron deficiency anemia She has received intravenous iron recently with improvement of anemia I recommend holding off further intravenous iron infusion as it will cause fall of her platelet count through removal of the compensatory thrombocytosis  Thrombocytopenia ([place]) She has recurrence of thrombocytopenia with intravenous iron replacement therapy She is not bleeding Observe for now  Goals of care, counseling/discussion We discussed goals of care briefly She is comfortable to proceed with treatment for another 3 months with repeat CT imaging to follow   No orders of the defined types were placed in this encounter.   All questions were answered. The patient knows to call  the clinic with any problems, questions or concerns. The total time spent in the appointment was 30 minutes encounter with patients including review of chart and various tests results, discussions about plan of care and coordination of care plan   [name], MD 03/23/2019 8:51 AM  INTERVAL HISTORY: Please see below for problem oriented charting. She returns for further follow-up and review of imaging studies She feels well No worsening neuropathy Denies recent infection, fever or chills The patient denies any recent signs or symptoms of bleeding such as spontaneous epistaxis, hematuria or hematochezia. 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 Had allergic reaction to carboplatin. Avastin was stopped due to GI hemorrhage   Primary peritoneal carcinomatosis ([place])  09/06/2011 Imaging   CT findings consistent with cirrhotic changes involving the liver.  No worrisome liver mass.  There are associated portal venous collaterals and splenomegaly consistent with portal venous hypertension. 2.  No other significant upper abdominal findings   11/08/2013 Imaging   US showed ascites   11/08/2013 Initial Diagnosis   Patient had ~ 2 months of some urinary incontinence, saw Dr Ronita Hipps in 10-2013, with pelvic mass on exam    11/12/2013 Imaging   There are findings of progressive hepatic cirrhosis with a large amount of ascites and mild splenomegaly. There are venous collaterals present. 2. There is abnormal thickening of the peritoneal surface along the left mid and lower abdominal wall. There is  abnormal soft tissue density in the pelvis as well which could reflect the clinically suspected ovarian malignancy but the findings are nondiagnostic. 3. There is a new small left pleural effusion. 4.  There is no acute bowel abnormality.   11/18/2013 Imaging   Successful ultrasound guided diagnostic and therapeutic paracentesis yielding 4.1 liters of ascites.    11/18/2013 Pathology Results   PERITONEAL/ASCITIC FLUID (SPECIMEN 1 OF 1 COLLECTED 11/18/13): SEROUS CARCINOMA, PLEASE SEE COMMENT.   12/01/2013 Tumor Marker   Patient's tumor was tested for the following markers: CA125 Results of the tumor marker test revealed 1938   12/03/2013 Imaging   Successful ultrasound-guided therapeutic paracentesis yielding 3.6 liters of peritoneal fluid.   12/07/2013 - 05/30/2014 Chemotherapy   She received 3 cycles of carboplatin and Taxol, interrupted cycle 4 for surgery.  She subsequently completed 3 more cycles of chemotherapy after surgery   12/20/2013 Imaging   Successful ultrasound-guided diagnostic and therapeutic paracentesis yielding 2.2 liters of peritoneal fluid   12/20/2013 Pathology Results   PERITONEAL/ASCITIC FLUID(SPECIMEN 1 OF 1 COLLECTED 12/20/13): MALIGNANT CELLS CONSISTENT WITH SEROUS CARCINOMA   01/13/2014 Tumor Marker   Patient's tumor was tested for the following markers: CA125 Results of the tumor marker test revealed 227   02/07/2014 Tumor Marker   Patient's tumor was tested for the following markers: CA125 Results of the tumor marker test revealed 130   02/15/2014 Genetic Testing   Genetics testing from 02-2014 normal ([employer] panel)   02/23/2014 Imaging   Interval decrease in ascites. 2. Morphologic changes in the liver consistent with cirrhosis. 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 Portal vein remains patent at this time. 3. Persistent but decreased abnormal soft tissue attenuation tracking in the omental fat. This may be secondary to interval improvement in metastatic disease. 4. Peritoneal thickening seen in the left abdomen and pelvis on the previous study has decreased and nearly resolved in the interval. This also suggests  interval improvement in metastatic disease.    03/07/2014 Procedure   Ultrasound and fluoroscopically guided right internal jugular single lumen power port catheter insertion. Tip in the SVC/RA junction. Catheter ready for use.   03/17/2014 Tumor Marker   Patient's tumor was tested for the following markers: CA125 Results of the tumor marker test revealed 45   03/29/2014 Pathology Results   1. Omentum, resection for tumor - HIGH GRADE SEROUS CARCINOMA, SEE COMMENT. 2. Ovary and fallopian tube, right - HIGH GRADE SEROUS CARCINOMA, SEE COMMENT. 3. Ovary and fallopian tube, left - HIGH GRADE SEROUS CARCINOMA, SEE COMMENT. Diagnosis Note 1. Nests and clusters of malignant cells are invading the omental tissue (part #1) with associated fibrosis. The cells are pleomorphic with prominent nucleoli. There are scattered psammoma bodies. The ovaries are atrophic and exhibit multiple foci of surface based invasive carcinoma and associated fibrosis. The fallopian tubes have a few foci of carcinoma, also superficially located. There are no precursor lesions noted in the ovaries or fallopian tubes (the entire tubes and ovaries were submitted for evaluation). While there is some retraction artifact, there are several foci suspicious for lymphovascular invasion. 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 Given the fibrosis, there does appear to be a small amount of treatment effect, however, there is abundant residual tumor.    03/29/2014 Surgery   Procedure(s) Performed: 1. Exploratory laparotomy with bilateral salpingo-oophorectomy, omentectomy radical tumor debulking for ovarian  cancer .  Surgeon: Thereasa Solo, MD.  Assistant Surgeon: Lahoma Crocker, M.D. Assistant: (an MD assistant was necessary for tissue manipulation, retraction and positioning due to the complexity  of the case and hospital policies).  Operative Findings: 10cm omental cake from hepatic to splenic flexure, densely adherent to transverse colon. Milliary studding of tumor implants (<55m, too numerous in number to count) adherent to the mesentery of the small bowel and small bowel wall. Small volume (100cc) ascites. Small ovaries bilaterally, densely adherent to the pelvic cul de sac. Left ovary and tube densely adherent to sigmoid colon. Cirrhotic liver with hepatomegaly and splenomegaly.     This represented an optimal cytoreduction (R1) with visible disease residual on the bowel wall and mesentery (millial, <331mimplants).     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 2. 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    08/18/2014 Tumor Marker   Patient's tumor was tested for the following markers: CA125 Results of the tumor marker test revealed 16   10/13/2014 Tumor Marker   Patient's tumor was tested for the following markers: CA125 Results of the tumor marker test revealed 14   11/21/2014 Tumor Marker   Patient's tumor was tested for the following markers: CA125 Results of the tumor marker test revealed 16   12/28/2014 Tumor Marker   Patient's tumor was tested for the following markers: CA125 Results of the tumor marker test revealed 762   02/27/2015 Tumor Marker   Patient's tumor was tested for the following markers: CA125 Results of the tumor marker test revealed 20.2   03/22/2015 Imaging   Filler  appearance to the prior exam, with very subtle fluid tracking along portions of the liver, and very minimal nodularity along the right paracolic gutter representing residua from the prior peritoneal cancer. The current abnormalities could simply be post therapy findings rather than necessarily representing residual malignancy. No new or enlarging lesions are identified. 2. Hepatic cirrhosis and splenomegaly. There is some gastric varices suggesting portal venous hypertension. 3. Left foraminal stenosis at L4-5 due to spurring. There is likely also some central narrowing of the thecal sac at this level. 4. Bibasilar scarring. 5. Chronic mild left mid kidney scarring   03/27/2015 Tumor Marker   Patient's tumor was tested for the following markers: CA125 Results of the tumor marker test revealed 25.3   04/24/2015 Imaging   Disc bulge L2-3 with mild spinal stenosis and right foraminal encroachment. 2. Disc bulge L3-4 with mild spinal stenosis. 3. Multifactorial spinal, left lateral recess and foraminal stenosis L4-5. There is grade 1 anterolisthesis without evident dynamic instability.   05/29/2015 Tumor Marker   Patient's tumor was tested for the following markers: CA125 Results of the tumor marker test revealed 29.9   08/21/2015 Tumor Marker   Patient's tumor was tested for the following markers: CA125 Results of the tumor marker test revealed 45   09/18/2015 Tumor Marker   Patient's tumor was tested for the following markers: CA125 Results of the tumor marker test revealed  47   09/27/2015 Imaging   New small bowel mesenteric nodule and enlarged left external iliac lymph node, highly worrisome for metastatic disease. 2. Cirrhosis and splenomegaly   10/10/2015 Tumor Marker   Patient's tumor was tested for the following markers: CA125 Results of the tumor marker test revealed 53.4   10/10/2015 - 12/11/2015 Chemotherapy   She received 4 cycles of carboplatin only   11/20/2015 Tumor Marker    Patient's tumor was tested for the following markers: CA125 Results of the tumor marker test revealed 41.3   12/20/2015 Imaging   CT: Mild left external iliac lymphadenopathy is slightly decreased. Mildly enlarged lower mesenteric nodule is slightly decreased. 2. No new or progressive metastatic disease in the abdomen or pelvis. 3. Cirrhosis. Stable subcentimeter hypodense left liver lobe lesion. No new liver lesions. 4. Stable mild splenomegaly.  No ascites. 5. Mild sigmoid diverticulosis.   01/01/2016 -  Anti-estrogen oral therapy   She was placed on tamoxifen. Had letrozole initially but switched to tamoxifen due to poor tolerance   01/15/2016 Tumor Marker   Patient's tumor was tested for the following markers: CA125 Results of the tumor marker test revealed 31.4   02/19/2016 Tumor Marker   Patient's tumor was tested for the following markers: CA125 Results of the tumor marker test revealed 36.5   05/13/2016 Imaging   No evidence of disease progression within the abdomen or pelvis. Previously noted small central mesenteric nodule and left external iliac node appear slightly smaller. 2. Stable changes of hepatic cirrhosis and portal hypertension with associated splenomegaly. No new or enlarging hepatic lesions are identified. 3. No acute findings.   05/13/2016 Tumor Marker   Patient's tumor was tested for the following markers: CA125 Results of the tumor marker test revealed 41.2   06/24/2016 Tumor Marker   Patient's tumor was tested for the following markers: CA125 Results of the tumor marker test revealed 47.3   08/20/2016 Imaging   1. The left external iliac lymph node is slightly increased in size compared to the prior exam, currently 1.1 cm and previously 0.9 cm in diameter. 2. Stable central mesenteric lymph node at 0.9 cm in diameter. 3. Stable trace free pelvic fluid and stable slight thickening along the right paracolic gutter, without well-defined peritoneal nodularity. 4. Other  imaging findings of potential clinical significance: Subsegmental atelectasis or scarring in the lung bases. Hepatic cirrhosis. Left renal scarring. Mild splenomegaly. Sigmoid colon diverticulosis. Impingement at L4-5 due to spondylosis and degenerative disc disease broad Schmorl' s nodes at L3-L4. Pelvic floor laxity   08/23/2016 Tumor Marker   Patient's tumor was tested for the following markers: CA125 Results of the tumor marker test revealed 80.2   09/04/2016 Imaging   LV EF: 60% -   65%   09/12/2016 Tumor Marker   Patient's tumor was tested for the following markers: CA125 Results of the tumor marker test revealed 98.5   09/12/2016 - 01/29/2018 Chemotherapy   The patient received Doxil and Avastin. Avastin was discontinued in Dec 2019 due to GI hemorrhage   09/26/2016 Tumor Marker   Patient's tumor was tested for the following markers: CA125 Results of the tumor marker test revealed 68.9   10/10/2016 Tumor Marker   Patient's tumor was tested for the following markers: CA125 Results of the tumor marker test revealed 65.6   11/07/2016 Tumor Marker   Patient's tumor was tested for the following markers: CA125 Results of the tumor marker test revealed 46.4   11/29/2016 Imaging   Stable mild  peritoneal thickening, suspicious for peritoneal carcinomatosis. No new or progressive disease identified. No evidence of ascites.  No significant change in 10 mm left external iliac and 8 mm mesenteric lymph nodes.  Stable hepatic cirrhosis, and splenomegaly consistent with portal venous hypertension.  Colonic diverticulosis, without radiographic evidence of diverticulitis.   12/02/2016 Imaging   Normal LV size with EF 55%. Basal inferior and basal inferoseptal hypokinesis. Normal RV size and systolic function. No significant valvular abnormalities.   12/05/2016 Tumor Marker   Patient's tumor was tested for the following markers: CA125 Results of the tumor marker test revealed 49.1    01/09/2017 Tumor Marker   Patient's tumor was tested for the following markers: CA125 Results of the tumor marker test revealed 47.2   02/06/2017 Tumor Marker   Patient's tumor was tested for the following markers: CA125 Results of the tumor marker test revealed 44.1   02/18/2017 Imaging   Stable mild peritoneal thickening. No new or progressive disease identified within the abdomen or pelvis.  Stable tiny sub-cm left external iliac and mesenteric lymph nodes.  Stable hepatic cirrhosis, and splenomegaly consistent with portal venous hypertension. No evidence of hepatic neoplasm.  Colonic diverticulosis, without radiographic evidence of diverticulitis.   03/06/2017 Tumor Marker   Patient's tumor was tested for the following markers: CA125 Results of the tumor marker test revealed 50.4   03/17/2017 Imaging   - Left ventricle: The cavity size was normal. Wall thickness was normal. Systolic function was normal. The estimated ejection fraction was in the range of 55% to 60%. Wall motion was normal; there were no regional wall motion abnormalities. Features are consistent with a pseudonormal left ventricular filling pattern, with concomitant abnormal relaxation and increased filling pressure (grade 2 diastolic dysfunction). - Mitral valve: There was mild regurgitation. - Left atrium: The atrium was mildly dilated   04/03/2017 Tumor Marker   Patient's tumor was tested for the following markers: CA125 Results of the tumor marker test revealed 47.2   05/14/2017 Imaging   CT scan of abdomen and pelvis Stable mild peritoneal thickening. No new or progressive disease within the abdomen or pelvis.  Stable hepatic cirrhosis. Stable splenomegaly, consistent with portal venous hypertension. No evidence of hepatic neoplasm.   05/15/2017 Tumor Marker   Patient's tumor was tested for the following markers: CA125 Results of the tumor marker test revealed 56.1   06/12/2017 Tumor Marker   Patient's tumor  was tested for the following markers: CA125 Results of the tumor marker test revealed 49.2   07/10/2017 Tumor Marker   Patient's tumor was tested for the following markers: CA125 Results of the tumor marker test revealed 46.3   08/07/2017 Tumor Marker   Patient's tumor was tested for the following markers: CA125 Results of the tumor marker test revealed 52.9   08/20/2017 Imaging   1. Mild omental/peritoneal haziness, unchanged. No evidence of new metastatic disease. 2. Cirrhosis with splenomegaly.   09/04/2017 Tumor Marker   Patient's tumor was tested for the following markers: CA125 Results of the tumor marker test revealed 48.5   11/13/2017 Tumor Marker   Patient's tumor was tested for the following markers: CA125 Results of the tumor marker test revealed 55.8   12/17/2017 Imaging   1. No findings to suggest metastatic disease in the abdomen or pelvis. 2. Severe hepatic cirrhosis with evidence of portal hypertension, as demonstrated by dilated portal vein, splenomegaly and portosystemic collateral pathways, including gastric and esophageal varices.  3. Colonic diverticulosis without evidence of acute diverticulitis at this  time. 4. Additional incidental findings, as above.   01/16/2018 Tumor Marker   Patient's tumor was tested for the following markers: CA125 Results of the tumor marker test revealed 63.7   02/10/2018 Procedure   EGD - Recently bleeding grade III and large (> 5 mm) esophageal varices. Completely eradicated. Banded. - Portal hypertensive gastropathy. - Normal examined duodenum. - No specimens collected   02/13/2018 Tumor Marker   Patient's tumor was tested for the following markers: CA125 Results of the tumor marker test revealed 82.7   02/25/2018 Imaging   Bone density showed mild osteopenia   04/08/2018 Tumor Marker   Patient's tumor was tested for the following markers: CA125 Results of the tumor marker test revealed 82.7   04/08/2018 Imaging   1. No  definite omental or peritoneal surface lesions. However, there are 2 slowly enlarging lymph nodes noted. One is in the small bowel mesentery and the other is in the left deep pelvis. Could not exclude recurrent disease. 2. Stable advanced cirrhotic changes involving the liver with portal venous hypertension, portal venous collaterals, esophageal varices and splenomegaly. No worrisome hepatic lesions. 3. Diffuse wall thickening of the colon could suggest diffuse colitis or could be due to low albumin. Recommend correlation with any symptoms such as diarrhea.   05/21/2018 Tumor Marker   Patient's tumor was tested for the following markers: CA125 Results of the tumor marker test revealed 86   09/07/2018 Tumor Marker   Patient's tumor was tested for the following markers:CA-125 Results of the tumor marker test revealed 111   09/29/2018 Tumor Marker   Patient's tumor was tested for the following markers: CA-125 Results of the tumor marker test revealed 121.   09/29/2018 - 11/24/2018 Chemotherapy   The patient had carboplatin and gemzar for chemotherapy treatment.  Chemo was stopped due to allergic reaction to carboplatin   10/27/2018 Tumor Marker   Patient's tumor was tested for the following markers: CA-125 Results of the tumor marker test revealed 99.9   11/24/2018 Tumor Marker   Patient's tumor was tested for the following markers: CA-125 Results of the tumor marker test revealed 81.2   12/14/2018 Tumor Marker   Patient's tumor was tested for the following markers: CA-125 Results of the tumor marker test revealed 81.2   12/14/2018 Imaging   1. Mild improvement in peritoneal disease. Peritoneal nodule within the left posterior pelvis contiguous with the left side of vaginal cuff is slightly decreased in size in the interval. Within the right iliac fossa there is a small peritoneal nodule which has decreased in size in the interval. Central small bowel mesenteric lymph node is not  significantly changed. Slight decrease in size left periaortic lymph node.    2. Morphologic features of the liver compatible with cirrhosis. Splenomegaly is noted which may reflect portal venous hypertension.   12/21/2018 Tumor Marker   Patient's tumor was tested for the following markers: CA-125 Results of the tumor marker test revealed 64.2   12/21/2018 -  Chemotherapy   The patient had taxotere for chemotherapy treatment.     03/02/2019 Tumor Marker   Patient's tumor was tested for the following markers: CA-125. 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   No new or progressive metastatic disease within the abdomen or pelvis.   Hepatic cirrhosis and findings of portal venous hypertension. 1.1 cm low-attenuation lesion within left hepatic lobe, which could represent a regenerative nodule or  small hepatocellular carcinoma. Recommend abdomen MRI without and with contrast for further characterization.       REVIEW OF SYSTEMS:   Constitutional: Denies fevers, chills or abnormal weight loss Eyes: Denies blurriness of vision Ears, nose, mouth, throat, and face: Denies mucositis or sore throat Respiratory: Denies cough, dyspnea or wheezes Cardiovascular: Denies palpitation, chest discomfort or lower extremity swelling Gastrointestinal:  Denies nausea, heartburn or change in bowel habits Skin: Denies abnormal skin rashes Lymphatics: Denies new lymphadenopathy or easy bruising Behavioral/Psych: Mood is stable, no new changes  All other systems were reviewed with the patient and are negative.  I have reviewed the past medical history, past surgical history, social history and family history with the patient and they are unchanged from previous note.  ALLERGIES:  is allergic to carboplatin; shellfish allergy; vancomycin; benadryl [diphenhydramine hcl]; penicillins; and tape.  MEDICATIONS:   Current Outpatient Medications  Medication Sig Dispense Refill  . cholecalciferol (VITAMIN D) 1000 units tablet Take 1,000 Units by mouth at bedtime.     Marland Kitchen dexamethasone (DECADRON) 4 MG tablet Start the day before Taxotere, take 1 tablet in the morning, and then 1 tablet daily after chemo for 2 days. Pls dispense 30 tabs (Patient taking differently: Take 4 mg by mouth See admin instructions. Start the day before Taxotere, take 1 tablet in the morning, and then 1 tablet daily after chemo for 2 days. Pls dispense 30 tabs) 30 tablet 1  . empagliflozin (JARDIANCE) 10 MG TABS tablet Take 10 mg by mouth daily.    Marland Kitchen gabapentin (NEURONTIN) 300 MG capsule Take 3 capsules (900 mg total) by mouth 2 (two) times daily. 120 capsule 11  . HUMALOG MIX 75/25 KWIKPEN (75-25) 100 UNIT/ML Kwikpen Inject 10 Units into the skin 2 (two) times daily. Before breakfast & before supper    . HYDROcodone-acetaminophen (NORCO/VICODIN) 5-325 MG tablet Take 1 tablet by mouth every 6 (six) hours as needed for moderate pain. 60 tablet 0  . lidocaine-prilocaine (EMLA) cream Apply to Porta-cath  1-2 hours prior to access as directed. (Patient taking differently: Apply 1 application topically daily as needed (port access). ) 30 g 9  . nadolol (CORGARD) 20 MG tablet Take 1 tablet (20 mg total) by mouth daily. (Patient taking differently: Take 20 mg by mouth at bedtime. ) 90 tablet 3  . ondansetron (ZOFRAN) 8 MG tablet Take 1 tablet (8 mg total) by mouth every 8 (eight) hours as needed. Start on the third day after chemotherapy. 30 tablet 1  . pantoprazole (PROTONIX) 40 MG tablet Take 1 tablet (40 mg total) by mouth daily.    . prochlorperazine (COMPAZINE) 10 MG tablet Take 1 tablet (10 mg total) by mouth every 6 (six) hours as needed (Nausea or vomiting). 30 tablet 1  . rotigotine (NEUPRO) 4 MG/24HR Place 1 patch onto the skin daily. 30 patch 7   No current facility-administered medications for this visit.    PHYSICAL  EXAMINATION: ECOG PERFORMANCE STATUS: 0 - Asymptomatic  Vitals:   03/23/19 0820  BP: 131/73  Pulse: 72  Resp: 18  Temp: 98.5 F (36.9 C)  SpO2: 100%   Filed Weights   03/23/19 0820  Weight: 189 lb 14.4 oz (86.1 kg)    GENERAL:alert, no distress and comfortable Musculoskeletal:no cyanosis of digits and no clubbing  NEURO: alert & oriented x 3 with fluent speech, no focal motor/sensory deficits  LABORATORY DATA:  I have reviewed the data as listed    Component Value Date/Time   NA  140 03/22/2019 0934   NA 142 02/06/2017 0742   K 4.2 03/22/2019 0934   K 3.9 02/06/2017 0742   CL 106 03/22/2019 0934   CO2 24 03/22/2019 0934   CO2 22 02/06/2017 0742   GLUCOSE 192 (H) 03/22/2019 0934   GLUCOSE 156 (H) 02/06/2017 0742   BUN 18 03/22/2019 0934   BUN 13.5 02/06/2017 0742   CREATININE 0.91 03/22/2019 0934   CREATININE 0.8 02/06/2017 0742   CALCIUM 8.7 (L) 03/22/2019 0934   CALCIUM 8.5 02/06/2017 0742   PROT 7.4 03/22/2019 0934   PROT 6.9 02/06/2017 0742   ALBUMIN 3.9 03/22/2019 0934   ALBUMIN 3.7 02/06/2017 0742   AST 32 03/22/2019 0934   AST 47 (H) 02/06/2017 0742   ALT 35 03/22/2019 0934   ALT 54 02/06/2017 0742   ALKPHOS 134 (H) 03/22/2019 0934   ALKPHOS 130 02/06/2017 0742   BILITOT 0.9 03/22/2019 0934   BILITOT 0.65 02/06/2017 0742   GFRNONAA >60 03/22/2019 0934   GFRAA >60 03/22/2019 0934    No results found for: SPEP, UPEP  Lab Results  Component Value Date   WBC 4.5 03/22/2019   NEUTROABS 3.8 03/22/2019   HGB 11.6 (L) 03/22/2019   HCT 36.7 03/22/2019   MCV 97.3 03/22/2019   PLT 89 (L) 03/22/2019      Chemistry      Component Value Date/Time   NA 140 03/22/2019 0934   NA 142 02/06/2017 0742   K 4.2 03/22/2019 0934   K 3.9 02/06/2017 0742   CL 106 03/22/2019 0934   CO2 24 03/22/2019 0934   CO2 22 02/06/2017 0742   BUN 18 03/22/2019 0934   BUN 13.5 02/06/2017 0742   CREATININE 0.91 03/22/2019 0934   CREATININE 0.8 02/06/2017 0742       Component Value Date/Time   CALCIUM 8.7 (L) 03/22/2019 0934   CALCIUM 8.5 02/06/2017 0742   ALKPHOS 134 (H) 03/22/2019 0934   ALKPHOS 130 02/06/2017 0742   AST 32 03/22/2019 0934   AST 47 (H) 02/06/2017 0742   ALT 35 03/22/2019 0934   ALT 54 02/06/2017 0742   BILITOT 0.9 03/22/2019 0934   BILITOT 0.65 02/06/2017 0742       RADIOGRAPHIC STUDIES: I have reviewed multiple imaging studies with the patient I have personally reviewed the radiological images as listed and agreed with the findings in the report. CT ABDOMEN PELVIS W CONTRAST  Result Date: 03/22/2019 CLINICAL DATA:  Follow-up metastatic ovarian carcinoma. Ongoing chemotherapy. EXAM: CT ABDOMEN AND PELVIS WITH CONTRAST TECHNIQUE: Multidetector CT imaging of the abdomen and pelvis was performed using the standard protocol following bolus administration of intravenous contrast. CONTRAST:  155m OMNIPAQUE IOHEXOL 300 MG/ML  SOLN COMPARISON:  12/14/2018 FINDINGS: Lower Chest: No acute findings. Hepatobiliary: Hepatic cirrhosis again noted. A 1.1 cm low-attenuation lesion is now seen in segment 3 of the left lobe on image 31/2. No other liver lesions identified. Tiny calcified gallstone again seen, however there is no evidence of cholecystitis or biliary ductal dilatation. Pancreas:  No mass or inflammatory changes. Spleen: Stable splenomegaly.  No splenic masses identified. Adrenals/Urinary Tract: No masses identified. No evidence of hydronephrosis. Stomach/Bowel: No evidence of obstruction, inflammatory process or abnormal fluid collections. Vascular/Lymphatic: No pathologically enlarged lymph nodes. Upper abdominal portosystemic collaterals again noted, consistent with portal venous hypertension. No abdominal aortic aneurysm. Reproductive: Prior hysterectomy again noted. Soft tissue nodule in the left adnexa near the vaginal cuff and sigmoid colon measures 1.6 x 1.4 cm, compared to  2.3 x 2.1 cm previously. Another soft tissue nodule within  the right posterior pararenal space measures 5 mm on image 52/2 compared 8 mm previously. 6 mm peritoneal nodule in the right lower quadrant small bowel mesentery image 56/2 has decreased from 10 mm previously. Mild bilateral peritoneal thickening shows no significant change. No evidence of ascites. Other:  None. Musculoskeletal:  No suspicious bone lesions identified. 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 No new or progressive metastatic disease within the abdomen or pelvis. Hepatic cirrhosis and findings of portal venous hypertension. 1.1 cm low-attenuation lesion within left hepatic lobe, which could represent a regenerative nodule or small hepatocellular carcinoma. Recommend abdomen MRI without and with contrast for further characterization. Electronically Signed   By: Marlaine Hind M.D.   On: 03/22/2019 13:35

## 2019-03-23 NOTE — Assessment & Plan Note (Signed)
She is known to have liver cirrhosis On her most recent CT imaging, the radiologist noted a 1 cm indeterminate lesion on the left lobe of her liver I reviewed her last few imaging studies and there is a shadow in that region [facility] cannot be excluded However, given the scope of things, I do not know if ordering MRI is going to be helpful as she is not a surgical candidate Also, with recurrent intravenous iron infusions, it could render MRI in accurate Ultimately, she is comfortable to be observed only as she will be due for another CT imaging in 3 months

## 2019-03-23 NOTE — Assessment & Plan Note (Signed)
I have reviewed multiple CT imaging with the patient I have also reviewed results of her tumor marker which is now improved to within normal limits Clinically, she had positive response to treatment despite significant dose adjustment and aggressive supportive care I recommend we proceed with 4 more cycles of treatment and repeat imaging study in 3 months, due around May of this year

## 2019-03-23 NOTE — Telephone Encounter (Signed)
Called and spoke with office staff. Gave below message to Myrtie Neither at Dr. Thereasa Parkin office. She verbalized understanding.

## 2019-03-23 NOTE — Assessment & Plan Note (Signed)
She has recurrence of thrombocytopenia with intravenous iron replacement therapy She is not bleeding Observe for now

## 2019-03-23 NOTE — Assessment & Plan Note (Signed)
We discussed goals of care briefly She is comfortable to proceed with treatment for another 3 months with repeat CT imaging to follow

## 2019-03-23 NOTE — Patient Instructions (Signed)
[facility] Discharge Instructions for Patients Receiving Chemotherapy  Today you received the following chemotherapy agents: Taxotere  To help prevent nausea and vomiting after your treatment, we encourage you to take your nausea medication as directed.    If you develop nausea and vomiting that is not controlled by your nausea medication, call the clinic.   BELOW ARE SYMPTOMS THAT SHOULD BE REPORTED IMMEDIATELY:  *FEVER GREATER THAN 100.5 F  *CHILLS WITH OR WITHOUT FEVER  NAUSEA AND VOMITING THAT IS NOT CONTROLLED WITH YOUR NAUSEA MEDICATION  *UNUSUAL SHORTNESS OF BREATH  *UNUSUAL BRUISING OR BLEEDING  TENDERNESS IN MOUTH AND THROAT WITH OR WITHOUT PRESENCE OF ULCERS  *URINARY PROBLEMS  *BOWEL PROBLEMS  UNUSUAL RASH Items with * indicate a potential emergency and should be followed up as soon as possible.  Feel free to call the clinic should you have any questions or concerns. The clinic phone number is [phone].  Please show the [place] at check-in to the Emergency Department and triage nurse.

## 2019-03-29 ENCOUNTER — Telehealth: Payer: Self-pay | Admitting: Neurology

## 2019-03-29 NOTE — Telephone Encounter (Signed)
[place] help? Call us at [phone] Status Sent to [facility] Neupro 4MG/24HR 24 hr patches Form [employer] [place] Medicare Part D General Authorization Form

## 2019-03-30 NOTE — Telephone Encounter (Signed)
MSG from [place]: "We received this fax requesting authorization for a medication however this medication is on the member's formulary at a tier 4 copay." Awaiting decision from Dr. Carles Collet to fill out non formulary exception request form.

## 2019-04-01 ENCOUNTER — Telehealth: Payer: Self-pay | Admitting: Hematology and Oncology

## 2019-04-01 NOTE — Telephone Encounter (Signed)
Scheduled appt per 2/9 sch message - pt is aware of appt date and time

## 2019-04-06 ENCOUNTER — Other Ambulatory Visit: Payer: Self-pay

## 2019-04-06 ENCOUNTER — Telehealth: Payer: Self-pay | Admitting: Neurology

## 2019-04-06 MED ORDER — NEUPRO 4 MG/24HR TD PT24: 1.0000 | MEDICATED_PATCH | Freq: Every day | TRANSDERMAL | 5 refills | Status: DC

## 2019-04-06 NOTE — Telephone Encounter (Signed)
Patient called regarding needing a refill for a Neupro patch sent to [facility] on [address]. 4 MG. She said [facility] has faxed over a request. Thank you

## 2019-04-06 NOTE — Telephone Encounter (Signed)
Rx(s) sent to pharmacy electronically.  

## 2019-04-07 NOTE — Telephone Encounter (Signed)
Contacted pharmacy to verify sure rx was recieved that was sent over yesterday. Pharmacist stated the rx was ordered yesterday and will be in today for patient.    Patient notified and voiced understanding.

## 2019-04-13 ENCOUNTER — Other Ambulatory Visit: Payer: Self-pay

## 2019-04-13 ENCOUNTER — Other Ambulatory Visit: Payer: Self-pay | Admitting: Hematology and Oncology

## 2019-04-13 ENCOUNTER — Inpatient Hospital Stay: Payer: Medicare Other

## 2019-04-13 ENCOUNTER — Inpatient Hospital Stay: Payer: Medicare Other | Attending: Hematology and Oncology | Admitting: Hematology and Oncology

## 2019-04-13 VITALS — BP 142/75 | HR 86 | Temp 98.2°F | Resp 18 | Ht 61.5 in | Wt 195.6 lb

## 2019-04-13 DIAGNOSIS — C482 Malignant neoplasm of peritoneum, unspecified: Secondary | ICD-10-CM | POA: Insufficient documentation

## 2019-04-13 DIAGNOSIS — Z79899 Other long term (current) drug therapy: Secondary | ICD-10-CM | POA: Insufficient documentation

## 2019-04-13 DIAGNOSIS — T451X5A Adverse effect of antineoplastic and immunosuppressive drugs, initial encounter: Secondary | ICD-10-CM | POA: Diagnosis not present

## 2019-04-13 DIAGNOSIS — D61818 Other pancytopenia: Secondary | ICD-10-CM | POA: Insufficient documentation

## 2019-04-13 DIAGNOSIS — R161 Splenomegaly, not elsewhere classified: Secondary | ICD-10-CM | POA: Diagnosis not present

## 2019-04-13 DIAGNOSIS — C569 Malignant neoplasm of unspecified ovary: Secondary | ICD-10-CM

## 2019-04-13 DIAGNOSIS — G620 Drug-induced polyneuropathy: Secondary | ICD-10-CM

## 2019-04-13 DIAGNOSIS — D509 Iron deficiency anemia, unspecified: Secondary | ICD-10-CM

## 2019-04-13 DIAGNOSIS — R600 Localized edema: Secondary | ICD-10-CM

## 2019-04-13 DIAGNOSIS — K7460 Unspecified cirrhosis of liver: Secondary | ICD-10-CM | POA: Diagnosis not present

## 2019-04-13 DIAGNOSIS — Z5111 Encounter for antineoplastic chemotherapy: Secondary | ICD-10-CM | POA: Insufficient documentation

## 2019-04-13 DIAGNOSIS — Z794 Long term (current) use of insulin: Secondary | ICD-10-CM | POA: Diagnosis not present

## 2019-04-13 DIAGNOSIS — E1140 Type 2 diabetes mellitus with diabetic neuropathy, unspecified: Secondary | ICD-10-CM | POA: Insufficient documentation

## 2019-04-13 DIAGNOSIS — Z7189 Other specified counseling: Secondary | ICD-10-CM

## 2019-04-13 DIAGNOSIS — E1165 Type 2 diabetes mellitus with hyperglycemia: Secondary | ICD-10-CM | POA: Insufficient documentation

## 2019-04-13 LAB — CBC WITH DIFFERENTIAL (CANCER CENTER ONLY)
Abs Immature Granulocytes: 0.03 10*3/uL (ref 0.00–0.07)
Basophils Absolute: 0 10*3/uL (ref 0.0–0.1)
Basophils Relative: 0 %
Eosinophils Absolute: 0 10*3/uL (ref 0.0–0.5)
Eosinophils Relative: 0 %
HCT: 33.3 % — ABNORMAL LOW (ref 36.0–46.0)
Hemoglobin: 10.9 g/dL — ABNORMAL LOW (ref 12.0–15.0)
Immature Granulocytes: 1 %
Lymphocytes Relative: 14 %
Lymphs Abs: 0.9 10*3/uL (ref 0.7–4.0)
MCH: 31.1 pg (ref 26.0–34.0)
MCHC: 32.7 g/dL (ref 30.0–36.0)
MCV: 95.1 fL (ref 80.0–100.0)
Monocytes Absolute: 0.4 10*3/uL (ref 0.1–1.0)
Monocytes Relative: 6 %
Neutro Abs: 4.7 10*3/uL (ref 1.7–7.7)
Neutrophils Relative %: 79 %
Platelet Count: 95 10*3/uL — ABNORMAL LOW (ref 150–400)
RBC: 3.5 MIL/uL — ABNORMAL LOW (ref 3.87–5.11)
RDW: 16.9 % — ABNORMAL HIGH (ref 11.5–15.5)
WBC Count: 6 10*3/uL (ref 4.0–10.5)
nRBC: 0 % (ref 0.0–0.2)

## 2019-04-13 LAB — CMP (CANCER CENTER ONLY)
ALT: 27 U/L (ref 0–44)
AST: 21 U/L (ref 15–41)
Albumin: 3.6 g/dL (ref 3.5–5.0)
Alkaline Phosphatase: 142 U/L — ABNORMAL HIGH (ref 38–126)
Anion gap: 9 (ref 5–15)
BUN: 17 mg/dL (ref 8–23)
CO2: 24 mmol/L (ref 22–32)
Calcium: 8.5 mg/dL — ABNORMAL LOW (ref 8.9–10.3)
Chloride: 110 mmol/L (ref 98–111)
Creatinine: 0.87 mg/dL (ref 0.44–1.00)
GFR, Est AFR Am: >60 mL/min (ref >60)
GFR, Estimated: >60 mL/min (ref >60)
Glucose, Bld: 288 mg/dL — ABNORMAL HIGH (ref 70–99)
Potassium: 3.8 mmol/L (ref 3.5–5.1)
Sodium: 143 mmol/L (ref 135–145)
Total Bilirubin: 0.7 mg/dL (ref 0.3–1.2)
Total Protein: 7 g/dL (ref 6.5–8.1)

## 2019-04-13 MED ORDER — HEPARIN SOD (PORK) LOCK FLUSH 100 UNIT/ML IV SOLN
500.0000 [IU] | Freq: Once | INTRAVENOUS | Status: AC | PRN
  Administered 2019-04-13: 500 [IU]
  Filled 2019-04-13: qty 5

## 2019-04-13 MED ORDER — INSULIN REGULAR HUMAN 100 UNIT/ML IJ SOLN
10.0000 [IU] | Freq: Once | INTRAMUSCULAR | Status: AC
  Administered 2019-04-13: 10 [IU] via SUBCUTANEOUS

## 2019-04-13 MED ORDER — PEGFILGRASTIM 6 MG/0.6ML ~~LOC~~ PSKT
PREFILLED_SYRINGE | SUBCUTANEOUS | Status: AC
  Filled 2019-04-13: qty 0.6

## 2019-04-13 MED ORDER — DEXAMETHASONE SODIUM PHOSPHATE 10 MG/ML IJ SOLN
INTRAMUSCULAR | Status: AC
  Filled 2019-04-13: qty 1

## 2019-04-13 MED ORDER — DEXAMETHASONE SODIUM PHOSPHATE 10 MG/ML IJ SOLN
5.0000 mg | Freq: Once | INTRAMUSCULAR | Status: AC
  Administered 2019-04-13: 5 mg via INTRAVENOUS

## 2019-04-13 MED ORDER — SODIUM CHLORIDE 0.9 % IV SOLN
37.5000 mg/m2 | Freq: Once | INTRAVENOUS | Status: AC
  Administered 2019-04-13: 70 mg via INTRAVENOUS
  Filled 2019-04-13: qty 7

## 2019-04-13 MED ORDER — PEGFILGRASTIM 6 MG/0.6ML ~~LOC~~ PSKT
6.0000 mg | PREFILLED_SYRINGE | Freq: Once | SUBCUTANEOUS | Status: AC
  Administered 2019-04-13: 6 mg via SUBCUTANEOUS

## 2019-04-13 MED ORDER — INSULIN REGULAR HUMAN 100 UNIT/ML IJ SOLN
INTRAMUSCULAR | Status: AC
  Filled 2019-04-13: qty 1

## 2019-04-13 MED ORDER — SODIUM CHLORIDE 0.9 % IV SOLN
Freq: Once | INTRAVENOUS | Status: AC
  Filled 2019-04-13: qty 250

## 2019-04-13 MED ORDER — SODIUM CHLORIDE 0.9% FLUSH
10.0000 mL | INTRAVENOUS | Status: DC | PRN
  Administered 2019-04-13: 10 mL
  Filled 2019-04-13: qty 10

## 2019-04-13 MED ORDER — HYDROCODONE-ACETAMINOPHEN 5-325 MG PO TABS: 1.0000 | ORAL_TABLET | Freq: Four times a day (QID) | ORAL | 0 refills | Status: DC | PRN

## 2019-04-13 NOTE — Patient Instructions (Signed)
COVID-19 Vaccine Information can be found at: [email or web address] For questions related to vaccine distribution or appointments, please email [email or web address] or call 4155883180.   [facility] Discharge Instructions for Patients Receiving Chemotherapy  Today you received the following chemotherapy agents: Docetaxel (Taxotere)  To help prevent nausea and vomiting after your treatment, we encourage you to take your nausea medication as directed by your provider.   If you develop nausea and vomiting that is not controlled by your nausea medication, call the clinic.   BELOW ARE SYMPTOMS THAT SHOULD BE REPORTED IMMEDIATELY:  *FEVER GREATER THAN 100.5 F  *CHILLS WITH OR WITHOUT FEVER  NAUSEA AND VOMITING THAT IS NOT CONTROLLED WITH YOUR NAUSEA MEDICATION  *UNUSUAL SHORTNESS OF BREATH  *UNUSUAL BRUISING OR BLEEDING  TENDERNESS IN MOUTH AND THROAT WITH OR WITHOUT PRESENCE OF ULCERS  *URINARY PROBLEMS  *BOWEL PROBLEMS  UNUSUAL RASH Items with * indicate a potential emergency and should be followed up as soon as possible.  Feel free to call the clinic should you have any questions or concerns. The clinic phone number is [phone].  Please show the [place] at check-in to the Emergency Department and triage nurse.  Coronavirus (COVID-19) Are you at risk?  Are you at risk for the Coronavirus (COVID-19)?  To be considered HIGH RISK for Coronavirus (COVID-19), you have to meet the following criteria:  . Traveled to [place], [place], [place], [place] or [place]; or in the [place] to [place], [place], [place], or [place]; and have fever, cough, and shortness of breath within the last 2 weeks of travel OR . Been in close contact with a person diagnosed with COVID-19 within the last 2 weeks and have fever, cough, and shortness of breath . IF YOU DO NOT MEET THESE CRITERIA, YOU ARE  CONSIDERED LOW RISK FOR COVID-19.  What to do if you are HIGH RISK for COVID-19?  Marland Kitchen If you are having a medical emergency, call 911. . Seek medical care right away. Before you go to a doctor's office, urgent care or emergency department, call ahead and tell them about your recent travel, contact with someone diagnosed with COVID-19, and your symptoms. You should receive instructions from your physician's office regarding next steps of care.  . When you arrive at healthcare provider, tell the healthcare staff immediately you have returned from visiting [place], [place], [place], [place] or [place]; or traveled in the [place] to [place], [place], [place], or [place]; in the last two weeks or you have been in close contact with a person diagnosed with COVID-19 in the last 2 weeks.   . Tell the health care staff about your symptoms: fever, cough and shortness of breath. . After you have been seen by a medical provider, you will be either: o Tested for (COVID-19) and discharged home on quarantine except to seek medical care if symptoms worsen, and asked to  - Stay home and avoid contact with others until you get your results (4-5 days)  - Avoid travel on public transportation if possible (such as bus, train, or airplane) or o Sent to the Emergency Department by EMS for evaluation, COVID-19 testing, and possible admission depending on your condition and test results.  What to do if you are LOW RISK for COVID-19?  Reduce your risk of any infection by using the same precautions used for avoiding the common cold or flu:  Marland Kitchen Wash your hands often with soap and warm water for at  least 20 seconds.  If soap and water are not readily available, use an alcohol-based hand sanitizer with at least 60% alcohol.  . If coughing or sneezing, cover your mouth and nose by coughing or sneezing into the elbow areas of your shirt or coat, into a tissue or into your sleeve (not your hands). . Avoid shaking hands  with others and consider head nods or verbal greetings only. . Avoid touching your eyes, nose, or mouth with unwashed hands.  . Avoid close contact with people who are sick. . Avoid places or events with large numbers of people in one location, like concerts or sporting events. . Carefully consider travel plans you have or are making. . If you are planning any travel outside or inside the [place], visit the CDC's Travelers' Health webpage for the latest health notices. . If you have some symptoms but not all symptoms, continue to monitor at home and seek medical attention if your symptoms worsen. . If you are having a medical emergency, call 911.   [place] / e-Visit: [email or web address]         [facility]: [facility]: [phone]                   [facility]: [phone]

## 2019-04-14 ENCOUNTER — Encounter: Payer: Self-pay | Admitting: Hematology and Oncology

## 2019-04-14 DIAGNOSIS — R600 Localized edema: Secondary | ICD-10-CM | POA: Insufficient documentation

## 2019-04-14 LAB — CA 125: Cancer Antigen (CA) 125: 36.8 U/mL (ref 0.0–38.1)

## 2019-04-14 NOTE — Assessment & Plan Note (Signed)
The cause of her bilateral lower extremity edema is multifactorial, likely secondary to side effects of Taxotere and fluid retention from dexamethasone There is nothing to suggest DVT or congestive heart failure on examination and her symptoms We discussed dietary modification by reducing salt intake For now, I am not in favor of prescribing diuretic therapy to minimize risk of dehydration

## 2019-04-14 NOTE — Assessment & Plan Note (Signed)
Her last imaging study showed positive response to treatment I have also reviewed results of her tumor marker which is now improved to within normal limits Clinically, she had positive response to treatment despite significant dose adjustment and aggressive supportive care I recommend we proceed with a few more cycles of treatment and repeat imaging study around May of this year

## 2019-04-14 NOTE — Assessment & Plan Note (Signed)
She has multifactorial pancytopenia, due to recent chemotherapy, liver disease with splenomegaly as well as history of iron deficiency I will repeat iron study in her next visit We will proceed with treatment without dose adjustment

## 2019-04-14 NOTE — Assessment & Plan Note (Signed)
She felt that her neuropathy might felt a little bit worse but I suspect it could be exacerbated from uncontrolled peripheral neuropathy For now, she will continue gabapentin and pain medicine as prescribed

## 2019-04-14 NOTE — Assessment & Plan Note (Addendum)
She is very distressed about her recent weight gain Her endocrinologist has discontinue empagliflozin Her blood sugar is out of control recently We spent a lot of time discussing the importance of dietary management and weight loss strategies We also discussed potential substitution of her diet with meal replacement if it is too daunting for the patient to calculate macronutrients The patient appears somewhat motivated I encouraged her to reach out to me from time to time if she need further advice about weight loss advice Due to severe hyperglycemia, I will prescribe additional insulin treatment while she is receiving chemotherapy

## 2019-04-14 NOTE — Progress Notes (Signed)
[facility] OFFICE PROGRESS NOTE  Patient Care Team: Gregor Hams, FNP as PCP - General (Family Medicine) Tat, Eustace Quail, DO as Consulting Physician (Neurology)  ASSESSMENT & PLAN:  Primary peritoneal carcinomatosis [facility]) Her last imaging study showed positive response to treatment I have also reviewed results of her tumor marker which is now improved to within normal limits Clinically, she had positive response to treatment despite significant dose adjustment and aggressive supportive care I recommend we proceed with a few more cycles of treatment and repeat imaging study around May of this year  Pancytopenia, acquired [facility]) She has multifactorial pancytopenia, due to recent chemotherapy, liver disease with splenomegaly as well as history of iron deficiency I will repeat iron study in her next visit We will proceed with treatment without dose adjustment  Diabetes mellitus with diabetic neuropathy, with long-term current use of insulin ([place]) She is very distressed about her recent weight gain Her endocrinologist has discontinue empagliflozin Her blood sugar is out of control recently We spent a lot of time discussing the importance of dietary management and weight loss strategies We also discussed potential substitution of her diet with meal replacement if it is too daunting for the patient to calculate macronutrients The patient appears somewhat motivated I encouraged her to reach out to me from time to time if she need further advice about weight loss advice Due to severe hyperglycemia, I will prescribe additional insulin treatment while she is receiving chemotherapy  Chemotherapy-induced neuropathy ([place]) She felt that her neuropathy might felt a little bit worse but I suspect it could be exacerbated from uncontrolled peripheral neuropathy For now, she will continue gabapentin and pain medicine as prescribed  Bilateral edema of lower extremity The cause of her  bilateral lower extremity edema is multifactorial, likely secondary to side effects of Taxotere and fluid retention from dexamethasone There is nothing to suggest DVT or congestive heart failure on examination and her symptoms We discussed dietary modification by reducing salt intake For now, I am not in favor of prescribing diuretic therapy to minimize risk of dehydration   Orders Placed This Encounter  Procedures  . Iron and TIBC    Standing Status:   Future    Standing Expiration Date:   05/18/2020  . Ferritin    Standing Status:   Future    Standing Expiration Date:   05/18/2020    All questions were answered. The patient knows to call the clinic with any problems, questions or concerns. The total time spent in the appointment was 30 minutes encounter with patients including review of chart and various tests results, discussions about plan of care and coordination of care plan   [name], MD 04/14/2019 7:45 AM  INTERVAL HISTORY: Please see below for problem oriented charting. She returns for chemotherapy and follow-up She complain of recent fatigue and appears to be distressed with her recent weight gain She noticed bilateral lower extremity edema, slightly more so in the left compared to the right Denies chest pain or recent significant shortness of breath No calf pain No recent falls She have some mild watery eyes and is due to see her eye doctor for evaluation Her blood sugar control recently was suboptimal Her endocrinologist has recently made some medication adjustment She denies recent nausea or changes in bowel habits The patient denies any recent signs or symptoms of bleeding such as spontaneous epistaxis, hematuria or hematochezia. 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 Had allergic reaction to carboplatin.  Avastin was stopped due to GI hemorrhage   Primary peritoneal carcinomatosis ([place])  09/06/2011 Imaging   CT findings consistent with cirrhotic changes involving the liver.  No worrisome liver mass.  There are associated portal venous collaterals and splenomegaly consistent with portal venous hypertension. 2.  No other significant upper abdominal findings   11/08/2013 Imaging   US showed ascites   11/08/2013 Initial Diagnosis   Patient had ~ 2 months of some urinary incontinence, saw Dr Ronita Hipps in 10-2013, with pelvic mass on exam    11/12/2013 Imaging   There are findings of progressive hepatic cirrhosis with a large amount of ascites and mild splenomegaly. There are venous collaterals present. 2. There is abnormal thickening of the peritoneal surface along the left mid and lower abdominal wall. There is abnormal soft tissue density in the pelvis as well which could reflect the clinically suspected ovarian malignancy but the findings are nondiagnostic. 3. There is a new small left pleural effusion. 4. There is no acute bowel abnormality.   11/18/2013 Imaging   Successful ultrasound guided diagnostic and therapeutic paracentesis yielding 4.1 liters of ascites.    11/18/2013 Pathology Results   PERITONEAL/ASCITIC FLUID (SPECIMEN 1 OF 1 COLLECTED 11/18/13): SEROUS CARCINOMA, PLEASE SEE COMMENT.   12/01/2013 Tumor Marker   Patient's tumor was tested for the following markers: CA125 Results of the tumor marker test revealed 1938   12/03/2013 Imaging   Successful ultrasound-guided therapeutic paracentesis yielding 3.6 liters of peritoneal fluid.   12/07/2013 - 05/30/2014 Chemotherapy   She received 3 cycles of carboplatin and Taxol, interrupted cycle 4 for surgery.  She subsequently completed 3 more cycles of chemotherapy after surgery   12/20/2013 Imaging   Successful ultrasound-guided diagnostic and therapeutic paracentesis yielding 2.2 liters of peritoneal fluid   12/20/2013 Pathology Results    PERITONEAL/ASCITIC FLUID(SPECIMEN 1 OF 1 COLLECTED 12/20/13): MALIGNANT CELLS CONSISTENT WITH SEROUS CARCINOMA   01/13/2014 Tumor Marker   Patient's tumor was tested for the following markers: CA125 Results of the tumor marker test revealed 227   02/07/2014 Tumor Marker   Patient's tumor was tested for the following markers: CA125 Results of the tumor marker test revealed 130   02/15/2014 Genetic Testing   Genetics testing from 02-2014 normal ([employer]va[employer] panel)   02/23/2014 Imaging   Interval decrease in ascites. 2. Morphologic changes in the liver consistent with cirrhosis. 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 Portal vein remains patent at this time. 3. Persistent but decreased abnormal soft tissue attenuation tracking in the omental fat. This may be secondary to interval improvement in metastatic disease. 4. Peritoneal thickening seen in the left abdomen and pelvis on the previous study has decreased and nearly resolved in the interval. This also suggests interval improvement in metastatic disease.    03/07/2014 Procedure   Ultrasound and fluoroscopically guided right internal jugular single lumen power port catheter insertion. Tip in the SVC/RA junction. Catheter ready for use.   03/17/2014 Tumor Marker   Patient's tumor was tested for the following markers: CA125 Results of the tumor marker test revealed 45   03/29/2014 Pathology Results   1. Omentum, resection for tumor - HIGH GRADE SEROUS CARCINOMA, SEE COMMENT. 2. Ovary and fallopian tube, right - HIGH GRADE SEROUS CARCINOMA, SEE COMMENT. 3. Ovary and fallopian tube, left - HIGH GRADE SEROUS CARCINOMA, SEE COMMENT. Diagnosis Note 1. Nests and clusters of  malignant cells are invading the omental tissue (part #1) with associated fibrosis. The cells are pleomorphic with prominent nucleoli. There are scattered psammoma bodies. The ovaries are atrophic and exhibit multiple foci of surface based  invasive carcinoma and associated fibrosis. The fallopian tubes have a few foci of carcinoma, also superficially located. There are no precursor lesions noted in the ovaries or fallopian tubes (the entire tubes and ovaries were submitted for evaluation). While there is some retraction artifact, there are several foci suspicious for lymphovascular invasion. 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 Given the fibrosis, there does appear to be a small amount of treatment effect, however, there is abundant residual tumor.    03/29/2014 Surgery   Procedure(s) Performed: 1. Exploratory laparotomy with bilateral salpingo-oophorectomy, omentectomy radical tumor debulking for ovarian cancer .  Surgeon: Thereasa Solo, MD.  Assistant Surgeon: Lahoma Crocker, M.D. Assistant: (an MD assistant was necessary for tissue manipulation, retraction and positioning due to the complexity of the case and hospital policies).  Operative Findings: 10cm omental cake from hepatic to splenic flexure, densely adherent to transverse colon. Milliary studding of tumor implants (<42m, too numerous in number to count) adherent to the mesentery of the small bowel and small bowel wall. Small volume (100cc) ascites. Small ovaries bilaterally, densely adherent to the pelvic cul de sac. Left ovary and tube densely adherent to sigmoid colon. Cirrhotic liver with hepatomegaly and splenomegaly.     This represented an optimal cytoreduction (R1) with visible disease residual on the bowel wall and mesentery (millial, <327mimplants).     04/18/2014 Tumor Marker   Patient's tumor was tested for the following markers: CA125 Results of the tumor marker test revealed 122   05/16/2014 Tumor Marker   Patient's tumor was tested for the following markers: CA125 Results of the tumor marker test revealed 30    06/10/2014 Tumor Marker   Patient's tumor was tested for the following markers: CA125 Results of the tumor marker test revealed 19   07/04/2014 Imaging   Interval improvement in the appearance of peritoneal metastasis secondary to ovarian cancer. 2. 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    08/18/2014 Tumor Marker   Patient's tumor was tested for the following markers: CA125 Results of the tumor marker test revealed 16   10/13/2014 Tumor Marker   Patient's tumor was tested for the following markers: CA125 Results of the tumor marker test revealed 14   11/21/2014 Tumor Marker   Patient's tumor was tested for the following markers: CA125 Results of the tumor marker test revealed 16   12/28/2014 Tumor Marker   Patient's tumor was tested for the following markers: CA125 Results of the tumor marker test revealed 762   02/27/2015 Tumor Marker   Patient's tumor was tested for the following markers: CA125 Results of the tumor marker test revealed 20.2   03/22/2015 Imaging   Filler appearance to the prior exam, with very subtle fluid tracking along portions of the liver, and very minimal nodularity along the right paracolic gutter representing residua from the prior peritoneal cancer. The current abnormalities could simply be post therapy findings rather than necessarily representing residual malignancy. No new or enlarging lesions are identified. 2. Hepatic cirrhosis and splenomegaly. There is some gastric varices suggesting portal venous hypertension. 3. Left foraminal stenosis at L4-5 due to spurring. There is likely also some central narrowing  of the thecal sac at this level. 4. Bibasilar scarring. 5. Chronic mild left mid kidney scarring   03/27/2015 Tumor Marker   Patient's tumor was tested for the following markers: CA125 Results of the tumor marker test revealed 25.3   04/24/2015 Imaging   Disc bulge L2-3 with mild spinal stenosis and right foraminal encroachment.  2. Disc bulge L3-4 with mild spinal stenosis. 3. Multifactorial spinal, left lateral recess and foraminal stenosis L4-5. There is grade 1 anterolisthesis without evident dynamic instability.   05/29/2015 Tumor Marker   Patient's tumor was tested for the following markers: CA125 Results of the tumor marker test revealed 29.9   08/21/2015 Tumor Marker   Patient's tumor was tested for the following markers: CA125 Results of the tumor marker test revealed 45   09/18/2015 Tumor Marker   Patient's tumor was tested for the following markers: CA125 Results of the tumor marker test revealed 47   09/27/2015 Imaging   New small bowel mesenteric nodule and enlarged left external iliac lymph node, highly worrisome for metastatic disease. 2. Cirrhosis and splenomegaly   10/10/2015 Tumor Marker   Patient's tumor was tested for the following markers: CA125 Results of the tumor marker test revealed 53.4   10/10/2015 - 12/11/2015 Chemotherapy   She received 4 cycles of carboplatin only   11/20/2015 Tumor Marker   Patient's tumor was tested for the following markers: CA125 Results of the tumor marker test revealed 41.3   12/20/2015 Imaging   CT: Mild left external iliac lymphadenopathy is slightly decreased. Mildly enlarged lower mesenteric nodule is slightly decreased. 2. No new or progressive metastatic disease in the abdomen or pelvis. 3. Cirrhosis. Stable subcentimeter hypodense left liver lobe lesion. No new liver lesions. 4. Stable mild splenomegaly.  No ascites. 5. Mild sigmoid diverticulosis.   01/01/2016 -  Anti-estrogen oral therapy   She was placed on tamoxifen. Had letrozole initially but switched to tamoxifen due to poor tolerance   01/15/2016 Tumor Marker   Patient's tumor was tested for the following markers: CA125 Results of the tumor marker test revealed 31.4   02/19/2016 Tumor Marker   Patient's tumor was tested for the following markers: CA125 Results of the tumor marker test revealed  36.5   05/13/2016 Imaging   No evidence of disease progression within the abdomen or pelvis. Previously noted small central mesenteric nodule and left external iliac node appear slightly smaller. 2. Stable changes of hepatic cirrhosis and portal hypertension with associated splenomegaly. No new or enlarging hepatic lesions are identified. 3. No acute findings.   05/13/2016 Tumor Marker   Patient's tumor was tested for the following markers: CA125 Results of the tumor marker test revealed 41.2   06/24/2016 Tumor Marker   Patient's tumor was tested for the following markers: CA125 Results of the tumor marker test revealed 47.3   08/20/2016 Imaging   1. The left external iliac lymph node is slightly increased in size compared to the prior exam, currently 1.1 cm and previously 0.9 cm in diameter. 2. Stable central mesenteric lymph node at 0.9 cm in diameter. 3. Stable trace free pelvic fluid and stable slight thickening along the right paracolic gutter, without well-defined peritoneal nodularity. 4. Other imaging findings of potential clinical significance: Subsegmental atelectasis or scarring in the lung bases. Hepatic cirrhosis. Left renal scarring. Mild splenomegaly. Sigmoid colon diverticulosis. Impingement at L4-5 due to spondylosis and degenerative disc disease broad Schmorl' s nodes at L3-L4. Pelvic floor laxity   08/23/2016 Tumor Marker   Patient's tumor  was tested for the following markers: CA125 Results of the tumor marker test revealed 80.2   09/04/2016 Imaging   LV EF: 60% -   65%   09/12/2016 Tumor Marker   Patient's tumor was tested for the following markers: CA125 Results of the tumor marker test revealed 98.5   09/12/2016 - 01/29/2018 Chemotherapy   The patient received Doxil and Avastin. Avastin was discontinued in Dec 2019 due to GI hemorrhage   09/26/2016 Tumor Marker   Patient's tumor was tested for the following markers: CA125 Results of the tumor marker test revealed 68.9    10/10/2016 Tumor Marker   Patient's tumor was tested for the following markers: CA125 Results of the tumor marker test revealed 65.6   11/07/2016 Tumor Marker   Patient's tumor was tested for the following markers: CA125 Results of the tumor marker test revealed 46.4   11/29/2016 Imaging   Stable mild peritoneal thickening, suspicious for peritoneal carcinomatosis. No new or progressive disease identified. No evidence of ascites.  No significant change in 10 mm left external iliac and 8 mm mesenteric lymph nodes.  Stable hepatic cirrhosis, and splenomegaly consistent with portal venous hypertension.  Colonic diverticulosis, without radiographic evidence of diverticulitis.   12/02/2016 Imaging   Normal LV size with EF 55%. Basal inferior and basal inferoseptal hypokinesis. Normal RV size and systolic function. No significant valvular abnormalities.   12/05/2016 Tumor Marker   Patient's tumor was tested for the following markers: CA125 Results of the tumor marker test revealed 49.1   01/09/2017 Tumor Marker   Patient's tumor was tested for the following markers: CA125 Results of the tumor marker test revealed 47.2   02/06/2017 Tumor Marker   Patient's tumor was tested for the following markers: CA125 Results of the tumor marker test revealed 44.1   02/18/2017 Imaging   Stable mild peritoneal thickening. No new or progressive disease identified within the abdomen or pelvis.  Stable tiny sub-cm left external iliac and mesenteric lymph nodes.  Stable hepatic cirrhosis, and splenomegaly consistent with portal venous hypertension. No evidence of hepatic neoplasm.  Colonic diverticulosis, without radiographic evidence of diverticulitis.   03/06/2017 Tumor Marker   Patient's tumor was tested for the following markers: CA125 Results of the tumor marker test revealed 50.4   03/17/2017 Imaging   - Left ventricle: The cavity size was normal. Wall thickness was normal. Systolic function  was normal. The estimated ejection fraction was in the range of 55% to 60%. Wall motion was normal; there were no regional wall motion abnormalities. Features are consistent with a pseudonormal left ventricular filling pattern, with concomitant abnormal relaxation and increased filling pressure (grade 2 diastolic dysfunction). - Mitral valve: There was mild regurgitation. - Left atrium: The atrium was mildly dilated   04/03/2017 Tumor Marker   Patient's tumor was tested for the following markers: CA125 Results of the tumor marker test revealed 47.2   05/14/2017 Imaging   CT scan of abdomen and pelvis Stable mild peritoneal thickening. No new or progressive disease within the abdomen or pelvis.  Stable hepatic cirrhosis. Stable splenomegaly, consistent with portal venous hypertension. No evidence of hepatic neoplasm.   05/15/2017 Tumor Marker   Patient's tumor was tested for the following markers: CA125 Results of the tumor marker test revealed 56.1   06/12/2017 Tumor Marker   Patient's tumor was tested for the following markers: CA125 Results of the tumor marker test revealed 49.2   07/10/2017 Tumor Marker   Patient's tumor was tested for the following markers:  CA125 Results of the tumor marker test revealed 46.3   08/07/2017 Tumor Marker   Patient's tumor was tested for the following markers: CA125 Results of the tumor marker test revealed 52.9   08/20/2017 Imaging   1. Mild omental/peritoneal haziness, unchanged. No evidence of new metastatic disease. 2. Cirrhosis with splenomegaly.   09/04/2017 Tumor Marker   Patient's tumor was tested for the following markers: CA125 Results of the tumor marker test revealed 48.5   11/13/2017 Tumor Marker   Patient's tumor was tested for the following markers: CA125 Results of the tumor marker test revealed 55.8   12/17/2017 Imaging   1. No findings to suggest metastatic disease in the abdomen or pelvis. 2. Severe hepatic cirrhosis with evidence of  portal hypertension, as demonstrated by dilated portal vein, splenomegaly and portosystemic collateral pathways, including gastric and esophageal varices.  3. Colonic diverticulosis without evidence of acute diverticulitis at this time. 4. Additional incidental findings, as above.   01/16/2018 Tumor Marker   Patient's tumor was tested for the following markers: CA125 Results of the tumor marker test revealed 63.7   02/10/2018 Procedure   EGD - Recently bleeding grade III and large (> 5 mm) esophageal varices. Completely eradicated. Banded. - Portal hypertensive gastropathy. - Normal examined duodenum. - No specimens collected   02/13/2018 Tumor Marker   Patient's tumor was tested for the following markers: CA125 Results of the tumor marker test revealed 82.7   02/25/2018 Imaging   Bone density showed mild osteopenia   04/08/2018 Tumor Marker   Patient's tumor was tested for the following markers: CA125 Results of the tumor marker test revealed 82.7   04/08/2018 Imaging   1. No definite omental or peritoneal surface lesions. However, there are 2 slowly enlarging lymph nodes noted. One is in the small bowel mesentery and the other is in the left deep pelvis. Could not exclude recurrent disease. 2. Stable advanced cirrhotic changes involving the liver with portal venous hypertension, portal venous collaterals, esophageal varices and splenomegaly. No worrisome hepatic lesions. 3. Diffuse wall thickening of the colon could suggest diffuse colitis or could be due to low albumin. Recommend correlation with any symptoms such as diarrhea.   05/21/2018 Tumor Marker   Patient's tumor was tested for the following markers: CA125 Results of the tumor marker test revealed 86   09/07/2018 Tumor Marker   Patient's tumor was tested for the following markers:CA-125 Results of the tumor marker test revealed 111   09/29/2018 Tumor Marker   Patient's tumor was tested for the following markers:  CA-125 Results of the tumor marker test revealed 121.   09/29/2018 - 11/24/2018 Chemotherapy   The patient had carboplatin and gemzar for chemotherapy treatment.  Chemo was stopped due to allergic reaction to carboplatin   10/27/2018 Tumor Marker   Patient's tumor was tested for the following markers: CA-125 Results of the tumor marker test revealed 99.9   11/24/2018 Tumor Marker   Patient's tumor was tested for the following markers: CA-125 Results of the tumor marker test revealed 81.2   12/14/2018 Tumor Marker   Patient's tumor was tested for the following markers: CA-125 Results of the tumor marker test revealed 81.2   12/14/2018 Imaging   1. Mild improvement in peritoneal disease. Peritoneal nodule within the left posterior pelvis contiguous with the left side of vaginal cuff is slightly decreased in size in the interval. Within the right iliac fossa there is a small peritoneal nodule which has decreased in size in the interval. Central  small bowel mesenteric lymph node is not significantly changed. Slight decrease in size left periaortic lymph node.    2. Morphologic features of the liver compatible with cirrhosis. Splenomegaly is noted which may reflect portal venous hypertension.   12/21/2018 Tumor Marker   Patient's tumor was tested for the following markers: CA-125 Results of the tumor marker test revealed 64.2   12/21/2018 -  Chemotherapy   The patient had taxotere for chemotherapy treatment.     03/02/2019 Tumor Marker   Patient's tumor was tested for the following markers: CA-125. 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   No new or progressive metastatic disease within the abdomen or pelvis.   Hepatic cirrhosis and findings of portal venous hypertension. 1.1 cm low-attenuation lesion within left hepatic lobe, which could represent a regenerative nodule or small  hepatocellular carcinoma. Recommend abdomen MRI without and with contrast for further characterization.     04/13/2019 Tumor Marker   Patient's tumor was tested for the following markers: CA-125 Results of the tumor marker test revealed 36.8     REVIEW OF SYSTEMS:   Constitutional: Denies fevers, chills or abnormal weight loss Eyes: Denies blurriness of vision Ears, nose, mouth, throat, and face: Denies mucositis or sore throat Respiratory: Denies cough, dyspnea or wheezes Cardiovascular: Denies palpitation, chest discomfort  Gastrointestinal:  Denies nausea, heartburn or change in bowel habits Skin: Denies abnormal skin rashes Lymphatics: Denies new lymphadenopathy or easy bruising Behavioral/Psych: Mood is stable, no new changes  All other systems were reviewed with the patient and are negative.  I have reviewed the past medical history, past surgical history, social history and family history with the patient and they are unchanged from previous note.  ALLERGIES:  is allergic to carboplatin; shellfish allergy; vancomycin; benadryl [diphenhydramine hcl]; penicillins; and tape.  MEDICATIONS:  Current Outpatient Medications  Medication Sig Dispense Refill  . cholecalciferol (VITAMIN D) 1000 units tablet Take 1,000 Units by mouth at bedtime.     Marland Kitchen dexamethasone (DECADRON) 4 MG tablet Start the day before Taxotere, take 1 tablet in the morning, and then 1 tablet daily after chemo for 2 days. Pls dispense 30 tabs (Patient taking differently: Take 4 mg by mouth See admin instructions. Start the day before Taxotere, take 1 tablet in the morning, and then 1 tablet daily after chemo for 2 days. Pls dispense 30 tabs) 30 tablet 1  . empagliflozin (JARDIANCE) 10 MG TABS tablet Take 10 mg by mouth daily.    Marland Kitchen gabapentin (NEURONTIN) 300 MG capsule Take 3 capsules (900 mg total) by mouth 2 (two) times daily. 120 capsule 11  . HUMALOG MIX 75/25 KWIKPEN (75-25) 100 UNIT/ML Kwikpen Inject 10 Units into  the skin 2 (two) times daily. Before breakfast & before supper    . HYDROcodone-acetaminophen (NORCO/VICODIN) 5-325 MG tablet Take 1 tablet by mouth every 6 (six) hours as needed for moderate pain. 60 tablet 0  . lidocaine-prilocaine (EMLA) cream Apply to Porta-cath  1-2 hours prior to access as directed. (Patient taking differently: Apply 1 application topically daily as needed (port access). ) 30 g 9  . nadolol (CORGARD) 20 MG tablet Take 1 tablet (20 mg total) by mouth daily. (Patient taking differently: Take 20 mg by mouth at bedtime. ) 90 tablet 3  . ondansetron (ZOFRAN) 8 MG tablet Take 1 tablet (8 mg total) by mouth every 8 (eight) hours as needed. Start on the  third day after chemotherapy. 30 tablet 1  . pantoprazole (PROTONIX) 40 MG tablet Take 1 tablet (40 mg total) by mouth daily.    . prochlorperazine (COMPAZINE) 10 MG tablet Take 1 tablet (10 mg total) by mouth every 6 (six) hours as needed (Nausea or vomiting). 30 tablet 1  . rotigotine (NEUPRO) 4 MG/24HR Place 1 patch onto the skin daily. 30 patch 5   No current facility-administered medications for this visit.    PHYSICAL EXAMINATION: ECOG PERFORMANCE STATUS: 2 - Symptomatic, <50% confined to bed  Vitals:   04/13/19 0909  BP: (!) 142/75  Pulse: 86  Resp: 18  Temp: 98.2 F (36.8 C)  SpO2: 100%   Filed Weights   04/13/19 0909  Weight: 195 lb 9.6 oz (88.7 kg)    GENERAL:alert, no distress and comfortable SKIN: skin color, texture, turgor are normal, no rashes or significant lesions EYES: normal, Conjunctiva are pink and non-injected, sclera clear OROPHARYNX:no exudate, no erythema and lips, buccal mucosa, and tongue normal  NECK: supple, thyroid normal size, non-tender, without nodularity LYMPH:  no palpable lymphadenopathy in the cervical, axillary or inguinal LUNGS: clear to auscultation and percussion with normal breathing effort HEART: regular rate & rhythm and no murmurs with moderate bilateral lower extremity  edema ABDOMEN:abdomen soft, non-tender and normal bowel sounds Musculoskeletal:no cyanosis of digits and no clubbing  NEURO: alert & oriented x 3 with fluent speech, no focal motor/sensory deficits  LABORATORY DATA:  I have reviewed the data as listed    Component Value Date/Time   NA 143 04/13/2019 0845   NA 142 02/06/2017 0742   K 3.8 04/13/2019 0845   K 3.9 02/06/2017 0742   CL 110 04/13/2019 0845   CO2 24 04/13/2019 0845   CO2 22 02/06/2017 0742   GLUCOSE 288 (H) 04/13/2019 0845   GLUCOSE 156 (H) 02/06/2017 0742   BUN 17 04/13/2019 0845   BUN 13.5 02/06/2017 0742   CREATININE 0.87 04/13/2019 0845   CREATININE 0.8 02/06/2017 0742   CALCIUM 8.5 (L) 04/13/2019 0845   CALCIUM 8.5 02/06/2017 0742   PROT 7.0 04/13/2019 0845   PROT 6.9 02/06/2017 0742   ALBUMIN 3.6 04/13/2019 0845   ALBUMIN 3.7 02/06/2017 0742   AST 21 04/13/2019 0845   AST 47 (H) 02/06/2017 0742   ALT 27 04/13/2019 0845   ALT 54 02/06/2017 0742   ALKPHOS 142 (H) 04/13/2019 0845   ALKPHOS 130 02/06/2017 0742   BILITOT 0.7 04/13/2019 0845   BILITOT 0.65 02/06/2017 0742   GFRNONAA >60 04/13/2019 0845   GFRAA >60 04/13/2019 0845    No results found for: SPEP, UPEP  Lab Results  Component Value Date   WBC 6.0 04/13/2019   NEUTROABS 4.7 04/13/2019   HGB 10.9 (L) 04/13/2019   HCT 33.3 (L) 04/13/2019   MCV 95.1 04/13/2019   PLT 95 (L) 04/13/2019      Chemistry      Component Value Date/Time   NA 143 04/13/2019 0845   NA 142 02/06/2017 0742   K 3.8 04/13/2019 0845   K 3.9 02/06/2017 0742   CL 110 04/13/2019 0845   CO2 24 04/13/2019 0845   CO2 22 02/06/2017 0742   BUN 17 04/13/2019 0845   BUN 13.5 02/06/2017 0742   CREATININE 0.87 04/13/2019 0845   CREATININE 0.8 02/06/2017 0742      Component Value Date/Time   CALCIUM 8.5 (L) 04/13/2019 0845   CALCIUM 8.5 02/06/2017 0742   ALKPHOS 142 (H) 04/13/2019 0845  ALKPHOS 130 02/06/2017 0742   AST 21 04/13/2019 0845   AST 47 (H) 02/06/2017 0742    ALT 27 04/13/2019 0845   ALT 54 02/06/2017 0742   BILITOT 0.7 04/13/2019 0845   BILITOT 0.65 02/06/2017 0742       RADIOGRAPHIC STUDIES: I have personally reviewed the radiological images as listed and agreed with the findings in the report. CT ABDOMEN PELVIS W CONTRAST  Result Date: 03/22/2019 CLINICAL DATA:  Follow-up metastatic ovarian carcinoma. Ongoing chemotherapy. EXAM: CT ABDOMEN AND PELVIS WITH CONTRAST TECHNIQUE: Multidetector CT imaging of the abdomen and pelvis was performed using the standard protocol following bolus administration of intravenous contrast. CONTRAST:  17m OMNIPAQUE IOHEXOL 300 MG/ML  SOLN COMPARISON:  12/14/2018 FINDINGS: Lower Chest: No acute findings. Hepatobiliary: Hepatic cirrhosis again noted. A 1.1 cm low-attenuation lesion is now seen in segment 3 of the left lobe on image 31/2. No other liver lesions identified. Tiny calcified gallstone again seen, however there is no evidence of cholecystitis or biliary ductal dilatation. Pancreas:  No mass or inflammatory changes. Spleen: Stable splenomegaly.  No splenic masses identified. Adrenals/Urinary Tract: No masses identified. No evidence of hydronephrosis. Stomach/Bowel: No evidence of obstruction, inflammatory process or abnormal fluid collections. Vascular/Lymphatic: No pathologically enlarged lymph nodes. Upper abdominal portosystemic collaterals again noted, consistent with portal venous hypertension. No abdominal aortic aneurysm. Reproductive: Prior hysterectomy again noted. Soft tissue nodule in the left adnexa near the vaginal cuff and sigmoid colon measures 1.6 x 1.4 cm, compared to 2.3 x 2.1 cm previously. Another soft tissue nodule within the right posterior pararenal space measures 5 mm on image 52/2 compared 8 mm previously. 6 mm peritoneal nodule in the right lower quadrant small bowel mesentery image 56/2 has decreased from 10 mm previously. Mild bilateral peritoneal thickening shows no significant change.  No evidence of ascites. Other:  None. Musculoskeletal:  No suspicious bone lesions identified. 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 No new or progressive metastatic disease within the abdomen or pelvis. Hepatic cirrhosis and findings of portal venous hypertension. 1.1 cm low-attenuation lesion within left hepatic lobe, which could represent a regenerative nodule or small hepatocellular carcinoma. Recommend abdomen MRI without and with contrast for further characterization. Electronically Signed   By: JMarlaine HindM.D.   On: 03/22/2019 13:35

## 2019-04-28 ENCOUNTER — Other Ambulatory Visit: Payer: Self-pay | Admitting: Gastroenterology

## 2019-05-04 ENCOUNTER — Other Ambulatory Visit: Payer: Self-pay | Admitting: Hematology and Oncology

## 2019-05-04 ENCOUNTER — Inpatient Hospital Stay: Payer: Medicare Other

## 2019-05-04 ENCOUNTER — Other Ambulatory Visit: Payer: Self-pay

## 2019-05-04 ENCOUNTER — Telehealth: Payer: Self-pay | Admitting: Hematology and Oncology

## 2019-05-04 ENCOUNTER — Encounter: Payer: Self-pay | Admitting: Hematology and Oncology

## 2019-05-04 ENCOUNTER — Inpatient Hospital Stay (HOSPITAL_BASED_OUTPATIENT_CLINIC_OR_DEPARTMENT_OTHER): Payer: Medicare Other | Admitting: Hematology and Oncology

## 2019-05-04 DIAGNOSIS — D509 Iron deficiency anemia, unspecified: Secondary | ICD-10-CM

## 2019-05-04 DIAGNOSIS — Z7189 Other specified counseling: Secondary | ICD-10-CM

## 2019-05-04 DIAGNOSIS — C482 Malignant neoplasm of peritoneum, unspecified: Secondary | ICD-10-CM

## 2019-05-04 DIAGNOSIS — G620 Drug-induced polyneuropathy: Secondary | ICD-10-CM | POA: Diagnosis not present

## 2019-05-04 DIAGNOSIS — E1140 Type 2 diabetes mellitus with diabetic neuropathy, unspecified: Secondary | ICD-10-CM

## 2019-05-04 DIAGNOSIS — Z794 Long term (current) use of insulin: Secondary | ICD-10-CM

## 2019-05-04 DIAGNOSIS — Z5111 Encounter for antineoplastic chemotherapy: Secondary | ICD-10-CM | POA: Diagnosis not present

## 2019-05-04 DIAGNOSIS — D61818 Other pancytopenia: Secondary | ICD-10-CM | POA: Diagnosis not present

## 2019-05-04 DIAGNOSIS — T451X5A Adverse effect of antineoplastic and immunosuppressive drugs, initial encounter: Secondary | ICD-10-CM

## 2019-05-04 LAB — CBC WITH DIFFERENTIAL (CANCER CENTER ONLY)
Abs Immature Granulocytes: 0.02 10*3/uL (ref 0.00–0.07)
Basophils Absolute: 0 10*3/uL (ref 0.0–0.1)
Basophils Relative: 0 %
Eosinophils Absolute: 0 10*3/uL (ref 0.0–0.5)
Eosinophils Relative: 0 %
HCT: 36.4 % (ref 36.0–46.0)
Hemoglobin: 11.8 g/dL — ABNORMAL LOW (ref 12.0–15.0)
Immature Granulocytes: 0 %
Lymphocytes Relative: 19 %
Lymphs Abs: 1.4 10*3/uL (ref 0.7–4.0)
MCH: 31.6 pg (ref 26.0–34.0)
MCHC: 32.4 g/dL (ref 30.0–36.0)
MCV: 97.3 fL (ref 80.0–100.0)
Monocytes Absolute: 0.6 10*3/uL (ref 0.1–1.0)
Monocytes Relative: 8 %
Neutro Abs: 5.3 10*3/uL (ref 1.7–7.7)
Neutrophils Relative %: 73 %
Platelet Count: 90 10*3/uL — ABNORMAL LOW (ref 150–400)
RBC: 3.74 MIL/uL — ABNORMAL LOW (ref 3.87–5.11)
RDW: 16.6 % — ABNORMAL HIGH (ref 11.5–15.5)
WBC Count: 7.3 10*3/uL (ref 4.0–10.5)
nRBC: 0 % (ref 0.0–0.2)

## 2019-05-04 LAB — CMP (CANCER CENTER ONLY)
ALT: 34 U/L (ref 0–44)
AST: 25 U/L (ref 15–41)
Albumin: 3.9 g/dL (ref 3.5–5.0)
Alkaline Phosphatase: 125 U/L (ref 38–126)
Anion gap: 9 (ref 5–15)
BUN: 22 mg/dL (ref 8–23)
CO2: 25 mmol/L (ref 22–32)
Calcium: 8.7 mg/dL — ABNORMAL LOW (ref 8.9–10.3)
Chloride: 109 mmol/L (ref 98–111)
Creatinine: 0.82 mg/dL (ref 0.44–1.00)
GFR, Est AFR Am: >60 mL/min (ref >60)
GFR, Estimated: >60 mL/min (ref >60)
Glucose, Bld: 130 mg/dL — ABNORMAL HIGH (ref 70–99)
Potassium: 3.7 mmol/L (ref 3.5–5.1)
Sodium: 143 mmol/L (ref 135–145)
Total Bilirubin: 0.7 mg/dL (ref 0.3–1.2)
Total Protein: 7.2 g/dL (ref 6.5–8.1)

## 2019-05-04 LAB — IRON AND TIBC
Iron: 77 ug/dL (ref 41–142)
Saturation Ratios: 23 % (ref 21–57)
TIBC: 333 ug/dL (ref 236–444)
UIBC: 255 ug/dL (ref 120–384)

## 2019-05-04 LAB — FERRITIN: Ferritin: 169 ng/mL (ref 11–307)

## 2019-05-04 MED ORDER — DEXAMETHASONE SODIUM PHOSPHATE 10 MG/ML IJ SOLN
5.0000 mg | Freq: Once | INTRAMUSCULAR | Status: AC
  Administered 2019-05-04: 5 mg via INTRAVENOUS

## 2019-05-04 MED ORDER — PEGFILGRASTIM 6 MG/0.6ML ~~LOC~~ PSKT
PREFILLED_SYRINGE | SUBCUTANEOUS | Status: AC
  Filled 2019-05-04: qty 0.6

## 2019-05-04 MED ORDER — DEXAMETHASONE SODIUM PHOSPHATE 10 MG/ML IJ SOLN
INTRAMUSCULAR | Status: AC
  Filled 2019-05-04: qty 1

## 2019-05-04 MED ORDER — SODIUM CHLORIDE 0.9% FLUSH
10.0000 mL | INTRAVENOUS | Status: DC | PRN
  Administered 2019-05-04: 10 mL
  Filled 2019-05-04: qty 10

## 2019-05-04 MED ORDER — SODIUM CHLORIDE 0.9 % IV SOLN
Freq: Once | INTRAVENOUS | Status: AC
  Filled 2019-05-04: qty 250

## 2019-05-04 MED ORDER — PEGFILGRASTIM 6 MG/0.6ML ~~LOC~~ PSKT
6.0000 mg | PREFILLED_SYRINGE | Freq: Once | SUBCUTANEOUS | Status: AC
  Administered 2019-05-04: 6 mg via SUBCUTANEOUS

## 2019-05-04 MED ORDER — HEPARIN SOD (PORK) LOCK FLUSH 100 UNIT/ML IV SOLN
500.0000 [IU] | Freq: Once | INTRAVENOUS | Status: AC | PRN
  Administered 2019-05-04: 500 [IU]
  Filled 2019-05-04: qty 5

## 2019-05-04 MED ORDER — SODIUM CHLORIDE 0.9 % IV SOLN
37.5000 mg/m2 | Freq: Once | INTRAVENOUS | Status: AC
  Administered 2019-05-04: 70 mg via INTRAVENOUS
  Filled 2019-05-04: qty 7

## 2019-05-04 NOTE — Assessment & Plan Note (Signed)
Her blood sugar is out of control recently She will continue prescription medications through her endocrinologist She is aware, if her blood sugar is over 250, I will prescribe additional insulin treatment while she is receiving chemotherapy

## 2019-05-04 NOTE — Progress Notes (Signed)
[place] OFFICE PROGRESS NOTE  Patient Care Team: Gregor Hams, FNP as PCP - General (Family Medicine) Tat, Eustace Quail, DO as Consulting Physician (Neurology)  ASSESSMENT & PLAN:  Primary peritoneal carcinomatosis [facility]) Her last imaging study showed positive response to treatment Her tumor marker which is now improved to within normal limits Clinically, she had positive response to treatment despite significant dose adjustment and aggressive supportive care I recommend we proceed with a few more cycles of treatment and repeat imaging study around May of this year  Pancytopenia, acquired [facility]) She has multifactorial pancytopenia, due to recent chemotherapy, liver disease with splenomegaly as well as history of iron deficiency I plan to repeat iron studies today I will call her with test results If she has recurrent iron deficiency anemia again, I will proceed with intravenous iron infusion in the next visit She has appointment scheduled for repeat EGD. She denies recent bleeding complications We will proceed with treatment without dose adjustment  Chemotherapy-induced neuropathy ([place]) She felt that her neuropathy is stable. For now, she will continue gabapentin and pain medicine as prescribed  Diabetes mellitus with diabetic neuropathy, with long-term current use of insulin (HCC) Her blood sugar is out of control recently She will continue prescription medications through her endocrinologist She is aware, if her blood sugar is over 250, I will prescribe additional insulin treatment while she is receiving chemotherapy   No orders of the defined types were placed in this encounter.   All questions were answered. The patient knows to call the clinic with any problems, questions or concerns. The total time spent in the appointment was 20 minutes encounter with patients including review of chart and various tests results, discussions about plan of care and  coordination of care plan   [name], MD 05/04/2019 11:24 AM  INTERVAL HISTORY: Please see below for problem oriented charting. 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 Had allergic reaction to carboplatin. Avastin was stopped due to GI hemorrhage   Primary peritoneal carcinomatosis ([place])  09/06/2011 Imaging   CT findings consistent with cirrhotic changes involving the liver.  No worrisome liver mass.  There are associated portal venous collaterals and splenomegaly consistent with portal venous hypertension. 2.  No other significant upper abdominal findings   11/08/2013 Imaging   US showed ascites   11/08/2013 Initial Diagnosis   Patient had ~ 2 months of some urinary incontinence, saw Dr Ronita Hipps in 10-2013, with pelvic mass on exam    11/12/2013 Imaging   There are findings of progressive hepatic cirrhosis with a large amount of ascites and mild splenomegaly. There are venous collaterals present. 2. There is abnormal thickening of the peritoneal surface along the left mid and lower abdominal wall. There is abnormal soft tissue density in the pelvis as well which could reflect the clinically suspected ovarian malignancy but the findings are nondiagnostic. 3. There is a new small left pleural effusion. 4. There is no acute bowel abnormality.   11/18/2013 Imaging   Successful ultrasound guided diagnostic and therapeutic paracentesis yielding 4.1 liters of ascites.    11/18/2013 Pathology Results   PERITONEAL/ASCITIC FLUID (SPECIMEN 1 OF 1 COLLECTED  11/18/13): SEROUS CARCINOMA, PLEASE SEE COMMENT.   12/01/2013 Tumor Marker    Patient's tumor was tested for the following markers: CA125 Results of the tumor marker test revealed 1938   12/03/2013 Imaging   Successful ultrasound-guided therapeutic paracentesis yielding 3.6 liters of peritoneal fluid.   12/07/2013 - 05/30/2014 Chemotherapy   She received 3 cycles of carboplatin and Taxol, interrupted cycle 4 for surgery.  She subsequently completed 3 more cycles of chemotherapy after surgery   12/20/2013 Imaging   Successful ultrasound-guided diagnostic and therapeutic paracentesis yielding 2.2 liters of peritoneal fluid   12/20/2013 Pathology Results   PERITONEAL/ASCITIC FLUID(SPECIMEN 1 OF 1 COLLECTED 12/20/13): MALIGNANT CELLS CONSISTENT WITH SEROUS CARCINOMA   01/13/2014 Tumor Marker   Patient's tumor was tested for the following markers: CA125 Results of the tumor marker test revealed 227   02/07/2014 Tumor Marker   Patient's tumor was tested for the following markers: CA125 Results of the tumor marker test revealed 130   02/15/2014 Genetic Testing   Genetics testing from 02-2014 normal ([employer]va[employer] panel)   02/23/2014 Imaging   Interval decrease in ascites. 2. Morphologic changes in the liver consistent with cirrhosis. 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 Portal vein remains patent at this time. 3. Persistent but decreased abnormal soft tissue attenuation tracking in the omental fat. This may be secondary to interval improvement in metastatic disease. 4. Peritoneal thickening seen in the left abdomen and pelvis on the previous study has decreased and nearly resolved in the interval. This also suggests interval improvement in metastatic disease.    03/07/2014 Procedure   Ultrasound and fluoroscopically guided right internal jugular single lumen power port catheter insertion. Tip in the SVC/RA junction. Catheter ready for use.   03/17/2014 Tumor Marker   Patient's tumor was tested for the following markers: CA125 Results of the  tumor marker test revealed 45   03/29/2014 Pathology Results   1. Omentum, resection for tumor - HIGH GRADE SEROUS CARCINOMA, SEE COMMENT. 2. Ovary and fallopian tube, right - HIGH GRADE SEROUS CARCINOMA, SEE COMMENT. 3. Ovary and fallopian tube, left - HIGH GRADE SEROUS CARCINOMA, SEE COMMENT. Diagnosis Note 1. Nests and clusters of malignant cells are invading the omental tissue (part #1) with associated fibrosis. The cells are pleomorphic with prominent nucleoli. There are scattered psammoma bodies. The ovaries are atrophic and exhibit multiple foci of surface based invasive carcinoma and associated fibrosis. The fallopian tubes have a few foci of carcinoma, also superficially located. There are no precursor lesions noted in the ovaries or fallopian tubes (the entire tubes and ovaries were submitted for evaluation). While there is some retraction artifact, there are several foci suspicious for lymphovascular invasion. 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 Given the fibrosis, there does appear to be a small amount of treatment effect, however, there is abundant residual tumor.    03/29/2014 Surgery   Procedure(s) Performed: 1. Exploratory laparotomy with bilateral salpingo-oophorectomy, omentectomy radical tumor debulking for ovarian cancer .  Surgeon: Thereasa Solo, MD.  Assistant Surgeon: Lahoma Crocker, M.D. Assistant: (an MD assistant was necessary for tissue manipulation, retraction and positioning due to the complexity of the case and hospital policies).  Operative Findings: 10cm omental cake from hepatic to splenic flexure, densely adherent to transverse colon. Milliary studding of tumor implants (<51m, too numerous in number to count) adherent to the mesentery of the small  bowel and small bowel wall. Small volume (100cc) ascites. Small ovaries  bilaterally, densely adherent to the pelvic cul de sac. Left ovary and tube densely adherent to sigmoid colon. Cirrhotic liver with hepatomegaly and splenomegaly.     This represented an optimal cytoreduction (R1) with visible disease residual on the bowel wall and mesentery (millial, <59m implants).     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 2. 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    08/18/2014 Tumor Marker   Patient's tumor was tested for the following markers: CA125 Results of the tumor marker test revealed 16   10/13/2014 Tumor Marker   Patient's tumor was tested for the following markers: CA125 Results of the tumor marker test revealed 14   11/21/2014 Tumor Marker   Patient's tumor was tested for the following markers: CA125 Results of the tumor marker test revealed 16   12/28/2014 Tumor Marker   Patient's tumor was tested for the following markers: CA125 Results of the tumor marker test revealed 762   02/27/2015 Tumor Marker   Patient's tumor was tested for the following markers: CA125 Results of the tumor marker test revealed 20.2   03/22/2015 Imaging   Filler appearance to the prior exam, with very subtle fluid tracking along portions of the liver, and very minimal nodularity along the right paracolic gutter representing residua from the prior peritoneal cancer. The current abnormalities could simply be post therapy findings rather than necessarily representing residual malignancy. No  new or enlarging lesions are identified. 2. Hepatic cirrhosis and splenomegaly. There is some gastric varices suggesting portal venous hypertension. 3. Left foraminal stenosis at L4-5 due to spurring. There is likely also some central narrowing of the thecal sac at this level. 4. Bibasilar scarring. 5. Chronic mild left mid kidney scarring   03/27/2015 Tumor Marker   Patient's tumor was tested for the following markers: CA125 Results of the tumor marker test revealed 25.3   04/24/2015 Imaging   Disc bulge L2-3 with mild spinal stenosis and right foraminal encroachment. 2. Disc bulge L3-4 with mild spinal stenosis. 3. Multifactorial spinal, left lateral recess and foraminal stenosis L4-5. There is grade 1 anterolisthesis without evident dynamic instability.   05/29/2015 Tumor Marker   Patient's tumor was tested for the following markers: CA125 Results of the tumor marker test revealed 29.9   08/21/2015 Tumor Marker   Patient's tumor was tested for the following markers: CA125 Results of the tumor marker test revealed 45   09/18/2015 Tumor Marker   Patient's tumor was tested for the following markers: CA125 Results of the tumor marker test revealed 47   09/27/2015 Imaging   New small bowel mesenteric nodule and enlarged left external iliac lymph node, highly worrisome for metastatic disease. 2. Cirrhosis and splenomegaly   10/10/2015 Tumor Marker   Patient's tumor was tested for the following markers: CA125 Results of the tumor marker test revealed 53.4   10/10/2015 - 12/11/2015 Chemotherapy   She received 4 cycles of carboplatin only   11/20/2015 Tumor  Marker   Patient's tumor was tested for the following markers: CA125 Results of the tumor marker test revealed 41.3   12/20/2015 Imaging   CT: Mild left external iliac lymphadenopathy is slightly decreased. Mildly enlarged lower mesenteric nodule is slightly decreased. 2. No new or progressive metastatic disease in the abdomen or pelvis. 3.  Cirrhosis. Stable subcentimeter hypodense left liver lobe lesion. No new liver lesions. 4. Stable mild splenomegaly.  No ascites. 5. Mild sigmoid diverticulosis.   01/01/2016 -  Anti-estrogen oral therapy   She was placed on tamoxifen. Had letrozole initially but switched to tamoxifen due to poor tolerance   01/15/2016 Tumor Marker   Patient's tumor was tested for the following markers: CA125 Results of the tumor marker test revealed 31.4   02/19/2016 Tumor Marker   Patient's tumor was tested for the following markers: CA125 Results of the tumor marker test revealed 36.5   05/13/2016 Imaging   No evidence of disease progression within the abdomen or pelvis. Previously noted small central mesenteric nodule and left external iliac node appear slightly smaller. 2. Stable changes of hepatic cirrhosis and portal hypertension with associated splenomegaly. No new or enlarging hepatic lesions are identified. 3. No acute findings.   05/13/2016 Tumor Marker   Patient's tumor was tested for the following markers: CA125 Results of the tumor marker test revealed 41.2   06/24/2016 Tumor Marker   Patient's tumor was tested for the following markers: CA125 Results of the tumor marker test revealed 47.3   08/20/2016 Imaging   1. The left external iliac lymph node is slightly increased in size compared to the prior exam, currently 1.1 cm and previously 0.9 cm in diameter. 2. Stable central mesenteric lymph node at 0.9 cm in diameter. 3. Stable trace free pelvic fluid and stable slight thickening along the right paracolic gutter, without well-defined peritoneal nodularity. 4. Other imaging findings of potential clinical significance: Subsegmental atelectasis or scarring in the lung bases. Hepatic cirrhosis. Left renal scarring. Mild splenomegaly. Sigmoid colon diverticulosis. Impingement at L4-5 due to spondylosis and degenerative disc disease broad Schmorl' s nodes at L3-L4. Pelvic floor laxity   08/23/2016 Tumor  Marker   Patient's tumor was tested for the following markers: CA125 Results of the tumor marker test revealed 80.2   09/04/2016 Imaging   LV EF: 60% -   65%   09/12/2016 Tumor Marker   Patient's tumor was tested for the following markers: CA125 Results of the tumor marker test revealed 98.5   09/12/2016 - 01/29/2018 Chemotherapy   The patient received Doxil and Avastin. Avastin was discontinued in Dec 2019 due to GI hemorrhage   09/26/2016 Tumor Marker   Patient's tumor was tested for the following markers: CA125 Results of the tumor marker test revealed 68.9   10/10/2016 Tumor Marker   Patient's tumor was tested for the following markers: CA125 Results of the tumor marker test revealed 65.6   11/07/2016 Tumor Marker   Patient's tumor was tested for the following markers: CA125 Results of the tumor marker test revealed 46.4   11/29/2016 Imaging   Stable mild peritoneal thickening, suspicious for peritoneal carcinomatosis. No new or progressive disease identified. No evidence of ascites.  No significant change in 10 mm left external iliac and 8 mm mesenteric lymph nodes.  Stable hepatic cirrhosis, and splenomegaly consistent with portal venous hypertension.  Colonic diverticulosis, without radiographic evidence of diverticulitis.   12/02/2016 Imaging   Normal LV size with EF 55%. Basal inferior and basal inferoseptal hypokinesis. Normal RV  size and systolic function. No significant valvular abnormalities.   12/05/2016 Tumor Marker   Patient's tumor was tested for the following markers: CA125 Results of the tumor marker test revealed 49.1   01/09/2017 Tumor Marker   Patient's tumor was tested for the following markers: CA125 Results of the tumor marker test revealed 47.2   02/06/2017 Tumor Marker   Patient's tumor was tested for the following markers: CA125 Results of the tumor marker test revealed 44.1   02/18/2017 Imaging   Stable mild peritoneal thickening. No new or  progressive disease identified within the abdomen or pelvis.  Stable tiny sub-cm left external iliac and mesenteric lymph nodes.  Stable hepatic cirrhosis, and splenomegaly consistent with portal venous hypertension. No evidence of hepatic neoplasm.  Colonic diverticulosis, without radiographic evidence of diverticulitis.   03/06/2017 Tumor Marker   Patient's tumor was tested for the following markers: CA125 Results of the tumor marker test revealed 50.4   03/17/2017 Imaging   - Left ventricle: The cavity size was normal. Wall thickness was normal. Systolic function was normal. The estimated ejection fraction was in the range of 55% to 60%. Wall motion was normal; there were no regional wall motion abnormalities. Features are consistent with a pseudonormal left ventricular filling pattern, with concomitant abnormal relaxation and increased filling pressure (grade 2 diastolic dysfunction). - Mitral valve: There was mild regurgitation. - Left atrium: The atrium was mildly dilated   04/03/2017 Tumor Marker   Patient's tumor was tested for the following markers: CA125 Results of the tumor marker test revealed 47.2   05/14/2017 Imaging   CT scan of abdomen and pelvis Stable mild peritoneal thickening. No new or progressive disease within the abdomen or pelvis.  Stable hepatic cirrhosis. Stable splenomegaly, consistent with portal venous hypertension. No evidence of hepatic neoplasm.   05/15/2017 Tumor Marker   Patient's tumor was tested for the following markers: CA125 Results of the tumor marker test revealed 56.1   06/12/2017 Tumor Marker   Patient's tumor was tested for the following markers: CA125 Results of the tumor marker test revealed 49.2   07/10/2017 Tumor Marker   Patient's tumor was tested for the following markers: CA125 Results of the tumor marker test revealed 46.3   08/07/2017 Tumor Marker   Patient's tumor was tested for the following markers: CA125 Results of the tumor  marker test revealed 52.9   08/20/2017 Imaging   1. Mild omental/peritoneal haziness, unchanged. No evidence of new metastatic disease. 2. Cirrhosis with splenomegaly.   09/04/2017 Tumor Marker   Patient's tumor was tested for the following markers: CA125 Results of the tumor marker test revealed 48.5   11/13/2017 Tumor Marker   Patient's tumor was tested for the following markers: CA125 Results of the tumor marker test revealed 55.8   12/17/2017 Imaging   1. No findings to suggest metastatic disease in the abdomen or pelvis. 2. Severe hepatic cirrhosis with evidence of portal hypertension, as demonstrated by dilated portal vein, splenomegaly and portosystemic collateral pathways, including gastric and esophageal varices.  3. Colonic diverticulosis without evidence of acute diverticulitis at this time. 4. Additional incidental findings, as above.   01/16/2018 Tumor Marker   Patient's tumor was tested for the following markers: CA125 Results of the tumor marker test revealed 63.7   02/10/2018 Procedure   EGD - Recently bleeding grade III and large (> 5 mm) esophageal varices. Completely eradicated. Banded. - Portal hypertensive gastropathy. - Normal examined duodenum. - No specimens collected   02/13/2018 Tumor Marker  Patient's tumor was tested for the following markers: CA125 Results of the tumor marker test revealed 82.7   02/25/2018 Imaging   Bone density showed mild osteopenia   04/08/2018 Tumor Marker   Patient's tumor was tested for the following markers: CA125 Results of the tumor marker test revealed 82.7   04/08/2018 Imaging   1. No definite omental or peritoneal surface lesions. However, there are 2 slowly enlarging lymph nodes noted. One is in the small bowel mesentery and the other is in the left deep pelvis. Could not exclude recurrent disease. 2. Stable advanced cirrhotic changes involving the liver with portal venous hypertension, portal venous collaterals,  esophageal varices and splenomegaly. No worrisome hepatic lesions. 3. Diffuse wall thickening of the colon could suggest diffuse colitis or could be due to low albumin. Recommend correlation with any symptoms such as diarrhea.   05/21/2018 Tumor Marker   Patient's tumor was tested for the following markers: CA125 Results of the tumor marker test revealed 86   09/07/2018 Tumor Marker   Patient's tumor was tested for the following markers:CA-125 Results of the tumor marker test revealed 111   09/29/2018 Tumor Marker   Patient's tumor was tested for the following markers: CA-125 Results of the tumor marker test revealed 121.   09/29/2018 - 11/24/2018 Chemotherapy   The patient had carboplatin and gemzar for chemotherapy treatment.  Chemo was stopped due to allergic reaction to carboplatin   10/27/2018 Tumor Marker   Patient's tumor was tested for the following markers: CA-125 Results of the tumor marker test revealed 99.9   11/24/2018 Tumor Marker   Patient's tumor was tested for the following markers: CA-125 Results of the tumor marker test revealed 81.2   12/14/2018 Tumor Marker   Patient's tumor was tested for the following markers: CA-125 Results of the tumor marker test revealed 81.2   12/14/2018 Imaging   1. Mild improvement in peritoneal disease. Peritoneal nodule within the left posterior pelvis contiguous with the left side of vaginal cuff is slightly decreased in size in the interval. Within the right iliac fossa there is a small peritoneal nodule which has decreased in size in the interval. Central small bowel mesenteric lymph node is not significantly changed. Slight decrease in size left periaortic lymph node.    2. Morphologic features of the liver compatible with cirrhosis. Splenomegaly is noted which may reflect portal venous hypertension.   12/21/2018 Tumor Marker   Patient's tumor was tested for the following markers: CA-125 Results of the tumor marker test revealed  64.2   12/21/2018 -  Chemotherapy   The patient had taxotere for chemotherapy treatment.     03/02/2019 Tumor Marker   Patient's tumor was tested for the following markers: CA-125. 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   No new or progressive metastatic disease within the abdomen or pelvis.   Hepatic cirrhosis and findings of portal venous hypertension. 1.1 cm low-attenuation lesion within left hepatic lobe, which could represent a regenerative nodule or small hepatocellular carcinoma. Recommend abdomen MRI without and with contrast for further characterization.     04/13/2019 Tumor Marker   Patient's tumor was tested for the following markers: CA-125 Results of the tumor marker test revealed 36.8     REVIEW OF SYSTEMS:   Constitutional: Denies fevers, chills or abnormal weight loss Eyes: Denies blurriness of vision Ears, nose, mouth, throat, and face: Denies mucositis or sore throat  Respiratory: Denies cough, dyspnea or wheezes Cardiovascular: Denies palpitation, chest discomfort or lower extremity swelling Gastrointestinal:  Denies nausea, heartburn or change in bowel habits Skin: Denies abnormal skin rashes Lymphatics: Denies new lymphadenopathy or easy bruising Behavioral/Psych: Mood is stable, no new changes  All other systems were reviewed with the patient and are negative.  I have reviewed the past medical history, past surgical history, social history and family history with the patient and they are unchanged from previous note.  ALLERGIES:  is allergic to carboplatin; shellfish allergy; vancomycin; benadryl [diphenhydramine hcl]; penicillins; and tape.  MEDICATIONS:  Current Outpatient Medications  Medication Sig Dispense Refill  . cholecalciferol (VITAMIN D) 1000 units tablet Take 1,000 Units by mouth at bedtime.     Marland Kitchen dexamethasone (DECADRON) 4 MG tablet Start the  day before Taxotere, take 1 tablet in the morning, and then 1 tablet daily after chemo for 2 days. Pls dispense 30 tabs (Patient taking differently: Take 4 mg by mouth See admin instructions. Start the day before Taxotere, take 1 tablet in the morning, and then 1 tablet daily after chemo for 2 days. Pls dispense 30 tabs) 30 tablet 1  . empagliflozin (JARDIANCE) 10 MG TABS tablet Take 10 mg by mouth daily.    Marland Kitchen gabapentin (NEURONTIN) 300 MG capsule Take 3 capsules (900 mg total) by mouth 2 (two) times daily. 120 capsule 11  . HUMALOG MIX 75/25 KWIKPEN (75-25) 100 UNIT/ML Kwikpen Inject 10 Units into the skin 2 (two) times daily. Before breakfast & before supper    . HYDROcodone-acetaminophen (NORCO/VICODIN) 5-325 MG tablet Take 1 tablet by mouth every 6 (six) hours as needed for moderate pain. 60 tablet 0  . lidocaine-prilocaine (EMLA) cream Apply to Porta-cath  1-2 hours prior to access as directed. (Patient taking differently: Apply 1 application topically daily as needed (port access). ) 30 g 9  . nadolol (CORGARD) 20 MG tablet Take 1 tablet (20 mg total) by mouth daily. (Patient taking differently: Take 20 mg by mouth at bedtime. ) 90 tablet 3  . ondansetron (ZOFRAN) 8 MG tablet Take 1 tablet (8 mg total) by mouth every 8 (eight) hours as needed. Start on the third day after chemotherapy. 30 tablet 1  . pantoprazole (PROTONIX) 40 MG tablet Take 1 tablet (40 mg total) by mouth daily.    . prochlorperazine (COMPAZINE) 10 MG tablet Take 1 tablet (10 mg total) by mouth every 6 (six) hours as needed (Nausea or vomiting). 30 tablet 1  . rotigotine (NEUPRO) 4 MG/24HR Place 1 patch onto the skin daily. 30 patch 5   No current facility-administered medications for this visit.    PHYSICAL EXAMINATION: ECOG PERFORMANCE STATUS: 1 - Symptomatic but completely ambulatory  Vitals:   05/04/19 1110  BP: (!) 154/63  Pulse: 73  Resp: 18  Temp: 97.8 F (36.6 C)  SpO2: 100%   Filed Weights   05/04/19 1110   Weight: 192 lb 6.4 oz (87.3 kg)    GENERAL:alert, no distress and comfortable SKIN: skin color, texture, turgor are normal, no rashes or significant lesions EYES: normal, Conjunctiva are pink and non-injected, sclera clear OROPHARYNX:no exudate, no erythema and lips, buccal mucosa, and tongue normal  NECK: supple, thyroid normal size, non-tender, without nodularity LYMPH:  no palpable lymphadenopathy in the cervical, axillary or inguinal LUNGS: clear to auscultation and percussion with normal breathing effort HEART: regular rate & rhythm and no murmurs and no lower extremity edema ABDOMEN:abdomen soft, non-tender and normal bowel sounds Musculoskeletal:no cyanosis of  digits and no clubbing  NEURO: alert & oriented x 3 with fluent speech, no focal motor/sensory deficits  LABORATORY DATA:  I have reviewed the data as listed    Component Value Date/Time   NA 143 04/13/2019 0845   NA 142 02/06/2017 0742   K 3.8 04/13/2019 0845   K 3.9 02/06/2017 0742   CL 110 04/13/2019 0845   CO2 24 04/13/2019 0845   CO2 22 02/06/2017 0742   GLUCOSE 288 (H) 04/13/2019 0845   GLUCOSE 156 (H) 02/06/2017 0742   BUN 17 04/13/2019 0845   BUN 13.5 02/06/2017 0742   CREATININE 0.87 04/13/2019 0845   CREATININE 0.8 02/06/2017 0742   CALCIUM 8.5 (L) 04/13/2019 0845   CALCIUM 8.5 02/06/2017 0742   PROT 7.0 04/13/2019 0845   PROT 6.9 02/06/2017 0742   ALBUMIN 3.6 04/13/2019 0845   ALBUMIN 3.7 02/06/2017 0742   AST 21 04/13/2019 0845   AST 47 (H) 02/06/2017 0742   ALT 27 04/13/2019 0845   ALT 54 02/06/2017 0742   ALKPHOS 142 (H) 04/13/2019 0845   ALKPHOS 130 02/06/2017 0742   BILITOT 0.7 04/13/2019 0845   BILITOT 0.65 02/06/2017 0742   GFRNONAA >60 04/13/2019 0845   GFRAA >60 04/13/2019 0845    No results found for: SPEP, UPEP  Lab Results  Component Value Date   WBC 7.3 05/04/2019   NEUTROABS 5.3 05/04/2019   HGB 11.8 (L) 05/04/2019   HCT 36.4 05/04/2019   MCV 97.3 05/04/2019   PLT 90  (L) 05/04/2019      Chemistry      Component Value Date/Time   NA 143 04/13/2019 0845   NA 142 02/06/2017 0742   K 3.8 04/13/2019 0845   K 3.9 02/06/2017 0742   CL 110 04/13/2019 0845   CO2 24 04/13/2019 0845   CO2 22 02/06/2017 0742   BUN 17 04/13/2019 0845   BUN 13.5 02/06/2017 0742   CREATININE 0.87 04/13/2019 0845   CREATININE 0.8 02/06/2017 0742      Component Value Date/Time   CALCIUM 8.5 (L) 04/13/2019 0845   CALCIUM 8.5 02/06/2017 0742   ALKPHOS 142 (H) 04/13/2019 0845   ALKPHOS 130 02/06/2017 0742   AST 21 04/13/2019 0845   AST 47 (H) 02/06/2017 0742   ALT 27 04/13/2019 0845   ALT 54 02/06/2017 0742   BILITOT 0.7 04/13/2019 0845   BILITOT 0.65 02/06/2017 8295

## 2019-05-04 NOTE — Patient Instructions (Signed)
[facility] Discharge Instructions for Patients Receiving Chemotherapy  Today you received the following chemotherapy agents: Taxotere  To help prevent nausea and vomiting after your treatment, we encourage you to take your nausea medication as directed.   If you develop nausea and vomiting that is not controlled by your nausea medication, call the clinic.   BELOW ARE SYMPTOMS THAT SHOULD BE REPORTED IMMEDIATELY:  *FEVER GREATER THAN 100.5 F  *CHILLS WITH OR WITHOUT FEVER  NAUSEA AND VOMITING THAT IS NOT CONTROLLED WITH YOUR NAUSEA MEDICATION  *UNUSUAL SHORTNESS OF BREATH  *UNUSUAL BRUISING OR BLEEDING  TENDERNESS IN MOUTH AND THROAT WITH OR WITHOUT PRESENCE OF ULCERS  *URINARY PROBLEMS  *BOWEL PROBLEMS  UNUSUAL RASH Items with * indicate a potential emergency and should be followed up as soon as possible.  Feel free to call the clinic should you have any questions or concerns. The clinic phone number is [phone].  Please show the [facility] at check-in to the Emergency Department and triage nurse.

## 2019-05-04 NOTE — Telephone Encounter (Signed)
Scheduled per 3/23 sch msg. Confirmed with nurse that pt would get a printout of new appts

## 2019-05-04 NOTE — Assessment & Plan Note (Signed)
She felt that her neuropathy is stable. For now, she will continue gabapentin and pain medicine as prescribed

## 2019-05-04 NOTE — Assessment & Plan Note (Signed)
Her last imaging study showed positive response to treatment Her tumor marker which is now improved to within normal limits Clinically, she had positive response to treatment despite significant dose adjustment and aggressive supportive care I recommend we proceed with a few more cycles of treatment and repeat imaging study around May of this year

## 2019-05-04 NOTE — Assessment & Plan Note (Signed)
She has multifactorial pancytopenia, due to recent chemotherapy, liver disease with splenomegaly as well as history of iron deficiency I plan to repeat iron studies today I will call her with test results If she has recurrent iron deficiency anemia again, I will proceed with intravenous iron infusion in the next visit She has appointment scheduled for repeat EGD. She denies recent bleeding complications We will proceed with treatment without dose adjustment

## 2019-05-05 ENCOUNTER — Telehealth: Payer: Self-pay

## 2019-05-05 NOTE — Telephone Encounter (Signed)
-----   Message from [name], MD sent at 05/05/2019  7:36 AM EDT ----- Regarding: pls call and let her know iron studies from ysterday is ok, no need IV iron

## 2019-05-05 NOTE — Telephone Encounter (Signed)
TC to Pt. Per Dr.Gorsuch message relayed to Pt., Pt. verbalized understanding.

## 2019-05-19 NOTE — Progress Notes (Signed)
Pharmacist Chemotherapy Monitoring - Follow Up Assessment    I verify that I have reviewed each item in the below checklist:  . Regimen for the patient is scheduled for the appropriate day and plan matches scheduled date. Marland Kitchen Appropriate non-routine labs are ordered dependent on drug ordered. . If applicable, additional medications reviewed and ordered per protocol based on lifetime cumulative doses and/or treatment regimen.   Plan for follow-up and/or issues identified: No . I-vent associated with next due treatment: No . MD and/or nursing notified: No  Acquanetta Belling 05/19/2019 12:09 PM

## 2019-05-20 ENCOUNTER — Other Ambulatory Visit (HOSPITAL_COMMUNITY)
Admission: RE | Admit: 2019-05-20 | Discharge: 2019-05-20 | Disposition: A | Discharge status: Home / Self Care | Payer: Medicare Other | Source: Ambulatory Visit | Attending: Gastroenterology | Admitting: Gastroenterology

## 2019-05-20 DIAGNOSIS — Z20822 Contact with and (suspected) exposure to covid-19: Secondary | ICD-10-CM | POA: Diagnosis not present

## 2019-05-20 DIAGNOSIS — Z01812 Encounter for preprocedural laboratory examination: Secondary | ICD-10-CM | POA: Diagnosis present

## 2019-05-20 LAB — SARS CORONAVIRUS 2 (TAT 6-24 HRS): SARS Coronavirus 2: NEGATIVE

## 2019-05-24 ENCOUNTER — Ambulatory Visit (HOSPITAL_COMMUNITY)
Admission: RE | Admit: 2019-05-24 | Discharge: 2019-05-24 | Disposition: A | Discharge status: Home / Self Care | Payer: Medicare Other | Attending: Gastroenterology | Admitting: Gastroenterology

## 2019-05-24 ENCOUNTER — Ambulatory Visit (HOSPITAL_COMMUNITY): Payer: Medicare Other | Admitting: Certified Registered"

## 2019-05-24 ENCOUNTER — Other Ambulatory Visit: Payer: Self-pay

## 2019-05-24 ENCOUNTER — Encounter (HOSPITAL_COMMUNITY)
Admission: RE | Disposition: A | Discharge status: Home / Self Care | Payer: Self-pay | Source: Home / Self Care | Attending: Gastroenterology

## 2019-05-24 DIAGNOSIS — J449 Chronic obstructive pulmonary disease, unspecified: Secondary | ICD-10-CM | POA: Diagnosis not present

## 2019-05-24 DIAGNOSIS — I8510 Secondary esophageal varices without bleeding: Secondary | ICD-10-CM | POA: Diagnosis not present

## 2019-05-24 DIAGNOSIS — K3189 Other diseases of stomach and duodenum: Secondary | ICD-10-CM | POA: Insufficient documentation

## 2019-05-24 DIAGNOSIS — Z87891 Personal history of nicotine dependence: Secondary | ICD-10-CM | POA: Diagnosis not present

## 2019-05-24 DIAGNOSIS — Z79899 Other long term (current) drug therapy: Secondary | ICD-10-CM | POA: Diagnosis not present

## 2019-05-24 DIAGNOSIS — K766 Portal hypertension: Secondary | ICD-10-CM | POA: Insufficient documentation

## 2019-05-24 DIAGNOSIS — Z90722 Acquired absence of ovaries, bilateral: Secondary | ICD-10-CM | POA: Insufficient documentation

## 2019-05-24 DIAGNOSIS — Z8543 Personal history of malignant neoplasm of ovary: Secondary | ICD-10-CM | POA: Insufficient documentation

## 2019-05-24 DIAGNOSIS — Z87892 Personal history of anaphylaxis: Secondary | ICD-10-CM | POA: Insufficient documentation

## 2019-05-24 DIAGNOSIS — G709 Myoneural disorder, unspecified: Secondary | ICD-10-CM | POA: Insufficient documentation

## 2019-05-24 DIAGNOSIS — Z794 Long term (current) use of insulin: Secondary | ICD-10-CM | POA: Insufficient documentation

## 2019-05-24 DIAGNOSIS — Z888 Allergy status to other drugs, medicaments and biological substances status: Secondary | ICD-10-CM | POA: Insufficient documentation

## 2019-05-24 DIAGNOSIS — G2581 Restless legs syndrome: Secondary | ICD-10-CM | POA: Diagnosis not present

## 2019-05-24 DIAGNOSIS — K7469 Other cirrhosis of liver: Secondary | ICD-10-CM | POA: Diagnosis not present

## 2019-05-24 DIAGNOSIS — I252 Old myocardial infarction: Secondary | ICD-10-CM | POA: Insufficient documentation

## 2019-05-24 DIAGNOSIS — Z91013 Allergy to seafood: Secondary | ICD-10-CM | POA: Insufficient documentation

## 2019-05-24 DIAGNOSIS — E119 Type 2 diabetes mellitus without complications: Secondary | ICD-10-CM | POA: Insufficient documentation

## 2019-05-24 DIAGNOSIS — Z833 Family history of diabetes mellitus: Secondary | ICD-10-CM | POA: Diagnosis not present

## 2019-05-24 DIAGNOSIS — Z8249 Family history of ischemic heart disease and other diseases of the circulatory system: Secondary | ICD-10-CM | POA: Insufficient documentation

## 2019-05-24 DIAGNOSIS — K529 Noninfective gastroenteritis and colitis, unspecified: Secondary | ICD-10-CM | POA: Diagnosis not present

## 2019-05-24 DIAGNOSIS — Z7952 Long term (current) use of systemic steroids: Secondary | ICD-10-CM | POA: Insufficient documentation

## 2019-05-24 HISTORY — PX: ESOPHAGOGASTRODUODENOSCOPY (EGD) WITH PROPOFOL: SHX5813

## 2019-05-24 HISTORY — PX: GASTRIC VARICES BANDING: SHX5519

## 2019-05-24 LAB — GLUCOSE, CAPILLARY: Glucose-Capillary: 213 mg/dL — ABNORMAL HIGH (ref 70–99)

## 2019-05-24 SURGERY — ESOPHAGOGASTRODUODENOSCOPY (EGD) WITH PROPOFOL
Anesthesia: Monitor Anesthesia Care

## 2019-05-24 MED ORDER — SODIUM CHLORIDE 0.9% FLUSH
10.0000 mL | INTRAVENOUS | Status: DC | PRN
  Administered 2019-05-24: 10 mL

## 2019-05-24 MED ORDER — SODIUM CHLORIDE 0.9 % IV SOLN: INTRAVENOUS | Status: DC

## 2019-05-24 MED ORDER — PROPOFOL 500 MG/50ML IV EMUL
INTRAVENOUS | Status: DC | PRN
  Administered 2019-05-24: 120 ug/kg/min via INTRAVENOUS
  Administered 2019-05-24 (×2): 20 mg via INTRAVENOUS
  Administered 2019-05-24: 10 mg via INTRAVENOUS

## 2019-05-24 MED ORDER — CHLORHEXIDINE GLUCONATE CLOTH 2 % EX PADS: 6.0000 | MEDICATED_PAD | Freq: Every day | CUTANEOUS | Status: DC

## 2019-05-24 MED ORDER — HEPARIN SOD (PORK) LOCK FLUSH 100 UNIT/ML IV SOLN
500.0000 [IU] | INTRAVENOUS | Status: AC | PRN
  Administered 2019-05-24: 500 [IU]

## 2019-05-24 MED ORDER — HYDROMORPHONE HCL 1 MG/ML IJ SOLN
1.0000 mg | Freq: Once | INTRAMUSCULAR | Status: AC
  Administered 2019-05-24: 1 mg via INTRAVENOUS

## 2019-05-24 MED ORDER — SODIUM CHLORIDE 0.9% FLUSH: 10.0000 mL | Freq: Two times a day (BID) | INTRAVENOUS | Status: DC

## 2019-05-24 MED ORDER — PROPOFOL 1000 MG/100ML IV EMUL
INTRAVENOUS | Status: AC
  Filled 2019-05-24: qty 100

## 2019-05-24 MED ORDER — HYDROMORPHONE HCL 1 MG/ML IJ SOLN
INTRAMUSCULAR | Status: AC
  Filled 2019-05-24: qty 1

## 2019-05-24 MED ORDER — LACTATED RINGERS IV SOLN: INTRAVENOUS | Status: DC

## 2019-05-24 SURGICAL SUPPLY — 15 items

## 2019-05-24 NOTE — Transfer of Care (Signed)
Immediate Anesthesia Transfer of Care Note  Patient: Olivia Benson  Procedure(s) Performed: ESOPHAGOGASTRODUODENOSCOPY (EGD) WITH PROPOFOL (N/A ) GASTRIC VARICES BANDING (N/A )  Patient Location: PACU  Anesthesia Type:MAC  Level of Consciousness: awake, alert  and oriented  Airway & Oxygen Therapy: Patient Spontanous Breathing and Patient connected to face mask oxygen  Post-op Assessment: Report given to RN, Post -op Vital signs reviewed and stable and Patient moving all extremities X 4  Post vital signs: Reviewed and stable  Last Vitals:  Vitals Value Taken Time  BP 151/80 05/24/19 1219  Temp    Pulse 85 05/24/19 1219  Resp 28 05/24/19 1219  SpO2 98 % 05/24/19 1219  Vitals shown include unvalidated device data.  Last Pain:  Vitals:   05/24/19 1219  TempSrc:   PainSc: 0-No pain         Complications: No apparent anesthesia complications

## 2019-05-24 NOTE — Anesthesia Postprocedure Evaluation (Signed)
Anesthesia Post Note  Patient: Olivia Benson  Procedure(s) Performed: ESOPHAGOGASTRODUODENOSCOPY (EGD) WITH PROPOFOL (N/A ) GASTRIC VARICES BANDING (N/A )     Patient location during evaluation: PACU Anesthesia Type: MAC Level of consciousness: awake and alert Pain management: pain level controlled Vital Signs Assessment: post-procedure vital signs reviewed and stable Respiratory status: spontaneous breathing Cardiovascular status: stable Anesthetic complications: no    Last Vitals:  Vitals:   05/24/19 1310 05/24/19 1320  BP: (!) 170/75 (!) 168/80  Pulse: 71 75  Resp: 11 15  Temp:    SpO2: 94% 97%    Last Pain:  Vitals:   05/24/19 1250  TempSrc:   PainSc: [place]

## 2019-05-24 NOTE — Anesthesia Preprocedure Evaluation (Signed)
Anesthesia Evaluation  Patient identified by MRN, date of birth, ID band Patient awake    Reviewed: Allergy & Precautions, NPO status , Patient's Chart, lab work & pertinent test results  History of Anesthesia Complications (+) PONV, Family history of anesthesia reaction and history of anesthetic complications  Airway Mallampati: II  TM Distance: >3 FB Neck ROM: Full    Dental no notable dental hx. (+) Teeth Intact   Pulmonary shortness of breath, asthma , COPD, former smoker,    Pulmonary exam normal breath sounds clear to auscultation       Cardiovascular hypertension, Pt. on medications and Pt. on home beta blockers + Past MI  Normal cardiovascular exam Rhythm:Regular Rate:Normal     Neuro/Psych  Neuromuscular disease    GI/Hepatic (+) Cirrhosis   Esophageal Varices and ascites    , Hepatitis -  Endo/Other  diabetes, Insulin Dependent  Renal/GU Renal diseaseK+ 3.4 Cr 0.82     Musculoskeletal   Abdominal (+) + obese,   Peds  Hematology  (+) anemia , Hgb 10.2 Plt 116   Anesthesia Other Findings   Reproductive/Obstetrics                             Anesthesia Physical  Anesthesia Plan  ASA: III  Anesthesia Plan: MAC   Post-op Pain Management:    Induction: Intravenous  PONV Risk Score and Plan: Treatment may vary due to age or medical condition  Airway Management Planned: Natural Airway and Nasal Cannula  Additional Equipment: None  Intra-op Plan:   Post-operative Plan:   Informed Consent: I have reviewed the patients History and Physical, chart, labs and discussed the procedure including the risks, benefits and alternatives for the proposed anesthesia with the patient or authorized representative who has indicated his/her understanding and acceptance.     Dental advisory given  Plan Discussed with: CRNA  Anesthesia Plan Comments: (Hx  od GI bleed 2nd to Varices  for EGD)        Anesthesia Quick Evaluation

## 2019-05-24 NOTE — Anesthesia Procedure Notes (Signed)
Procedure Name: MAC Date/Time: 05/24/2019 11:58 AM Performed by: Niel Hummer, CRNA Pre-anesthesia Checklist: Patient identified, Emergency Drugs available, Suction available and Patient being monitored Oxygen Delivery Method: Simple face mask

## 2019-05-24 NOTE — Op Note (Signed)
[facility] Patient Name: Olivia Benson Procedure Date: 05/24/2019 MRN: [id number] Attending MD: Ronnette Juniper , MD Date of Birth: Jul 05, 1945 CSN: [id number] Age: 74 Admit Type: Outpatient Procedure:                Upper GI endoscopy Indications:              For therapy of esophageal varices Providers:                Ronnette Juniper, MD, Benay Pillow, RN, Elspeth Cho                            Tech., Technician, Courtney Heys [place], CRNA Referring MD:             Barnabas Lister Medicines:                Monitored Anesthesia Care Complications:            No immediate complications. Estimated blood loss:                            Minimal. Estimated Blood Loss:     Estimated blood loss was minimal. Procedure:                Pre-Anesthesia Assessment:                           - Prior to the procedure, a History and Physical                            was performed, and patient medications and                            allergies were reviewed. The patient's tolerance of                            previous anesthesia was also reviewed. The risks                            and benefits of the procedure and the sedation                            options and risks were discussed with the patient.                            All questions were answered, and informed consent                            was obtained. Prior Anticoagulants: The patient has                            taken no previous anticoagulant or antiplatelet                            agents. ASA Grade Assessment: III - A patient with  severe systemic disease. After reviewing the risks                            and benefits, the patient was deemed in                            satisfactory condition to undergo the procedure.                           After obtaining informed consent, the endoscope was                            passed under direct vision. Throughout the                 procedure, the patient's blood pressure, pulse, and                            oxygen saturations were monitored continuously. The                            GIF-H190 ([id number]) Olympus gastroscope was                            introduced through the mouth, and advanced to the                            second part of duodenum. The upper GI endoscopy was                            accomplished without difficulty. The patient                            tolerated the procedure well. Scope In: Scope Out: Findings:      Grade II varices were found in the middle third of the esophagus and in       the lower third of the esophagus. They were medium in size. Four bands       were successfully placed with complete eradication, resulting in       deflation of varices.      There was evidence of scarring from prior banding.      Moderate portal hypertensive gastropathy was found in the cardia, in the       gastric fundus and in the gastric body.      The cardia and gastric fundus were normal on retroflexion.      The examined duodenum was normal. Impression:               - Grade II esophageal varices. Completely                            eradicated. Banded.                           - Portal hypertensive gastropathy.                           -  Normal examined duodenum.                           - No specimens collected. Moderate Sedation:      Patient did not receive moderate sedation for this procedure, but       instead received monitored anesthesia care. Recommendation:           - Patient has a contact number available for                            emergencies. The signs and symptoms of potential                            delayed complications were discussed with the                            patient. Return to normal activities tomorrow.                            Written discharge instructions were provided to the                            patient.                            - Resume regular diet.                           - Continue present medications.                           - Repeat upper endoscopy in 6 months for                            retreatment. Procedure Code(s):        --- Professional ---                           [id number], Esophagogastroduodenoscopy, flexible,                            transoral; with band ligation of esophageal/gastric                            varices Diagnosis Code(s):        --- Professional ---                           I85.00, Esophageal varices without bleeding                           K76.6, Portal hypertension                           K31.89, Other diseases of stomach and duodenum CPT copyright 2019 [employer]. All rights reserved. The codes documented in this report are preliminary and upon coder review may  be revised to meet current  compliance requirements. Ronnette Juniper, MD 05/24/2019 12:23:00 PM This report has been signed electronically. Number of Addenda: 0

## 2019-05-24 NOTE — Interval H&P Note (Signed)
History and Physical Interval Note: 72/female with cirrhosis and esophageal varices for EGD and banding.  05/24/2019 11:13 AM  Olivia Benson  has presented today for EGD and banding, with the diagnosis of Esophageal varices - I85.00.  The various methods of treatment have been discussed with the patient and family. After consideration of risks, benefits and other options for treatment, the patient has consented to  Procedure(s): ESOPHAGOGASTRODUODENOSCOPY (EGD) WITH PROPOFOL (N/A) GASTRIC VARICES BANDING (N/A) as a surgical intervention.  The patient's history has been reviewed, patient examined, no change in status, stable for surgery.  I have reviewed the patient's chart and labs.  Questions were answered to the patient's satisfaction.     Ronnette Juniper

## 2019-05-24 NOTE — Brief Op Note (Signed)
05/24/2019  12:13 PM  PATIENT:  [name]  74 y.o. female  PRE-OPERATIVE DIAGNOSIS:  Esophageal varices - I85.00  POST-OPERATIVE DIAGNOSIS:  esophageal varices with banding, portal hypertensive gastropathy   PROCEDURE:  Procedure(s): ESOPHAGOGASTRODUODENOSCOPY (EGD) WITH PROPOFOL (N/A) GASTRIC VARICES BANDING (N/A)  SURGEON:  Surgeon(s) and Role:    Ronnette Juniper, MD - Primary  PHYSICIAN ASSISTANT:   ASSISTANTS: Percell Boston Cummings,Tech  ANESTHESIA:   MAC  EBL:  Minimal   BLOOD ADMINISTERED:none  DRAINS: none   LOCAL MEDICATIONS USED:  NONE  SPECIMEN:  No Specimen  DISPOSITION OF SPECIMEN:  N/A  COUNTS:  YES  TOURNIQUET:  * No tourniquets in log *  DICTATION: .Dragon Dictation  PLAN OF CARE: Discharge to home after PACU  PATIENT DISPOSITION:  PACU - hemodynamically stable.   Delay start of Pharmacological VTE agent (>24hrs) due to surgical blood loss or risk of bleeding: no

## 2019-05-24 NOTE — Discharge Instructions (Signed)
YOU HAD AN ENDOSCOPIC PROCEDURE TODAY: Refer to the procedure report and other information in the discharge instructions given to you for any specific questions about what was found during the examination. If this information does not answer your questions, please call [facility] office at [phone] to clarify.   YOU SHOULD EXPECT: Some feelings of bloating in the abdomen. Passage of more gas than usual. Walking can help get rid of the air that was put into your GI tract during the procedure and reduce the bloating. If you had a lower endoscopy (such as a colonoscopy or flexible sigmoidoscopy) you may notice spotting of blood in your stool or on the toilet paper. Some abdominal soreness may be present for a day or two, also.  DIET: Your first meal following the procedure should be a light meal and then it is ok to progress to your normal diet. A half-sandwich or bowl of soup is an example of a good first meal. Heavy or fried foods are harder to digest and may make you feel nauseous or bloated. Drink plenty of fluids but you should avoid alcoholic beverages for 24 hours. If you had a esophageal dilation, please see attached instructions for diet.    ACTIVITY: Your care partner should take you home directly after the procedure. You should plan to take it easy, moving slowly for the rest of the day. You can resume normal activity the day after the procedure however YOU SHOULD NOT DRIVE, use power tools, machinery or perform tasks that involve climbing or major physical exertion for 24 hours (because of the sedation medicines used during the test).   SYMPTOMS TO REPORT IMMEDIATELY: A gastroenterologist can be reached at any hour. Please call [phone]  for any of the following symptoms:   Following upper endoscopy (EGD, EUS, ERCP, esophageal dilation) Vomiting of blood or coffee ground material  New, significant abdominal pain  New, significant chest pain or pain under the shoulder blades  Painful or  persistently difficult swallowing  New shortness of breath  Black, tarry-looking or red, bloody stools  FOLLOW UP:  If any biopsies were taken you will be contacted by phone or by letter within the next 1-3 weeks. Call [phone]  if you have not heard about the biopsies in 3 weeks.  Please also call with any specific questions about appointments or follow up tests.  
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
## 2019-05-25 ENCOUNTER — Encounter: Payer: Self-pay | Admitting: Hematology and Oncology

## 2019-05-25 ENCOUNTER — Inpatient Hospital Stay: Payer: Medicare Other

## 2019-05-25 ENCOUNTER — Other Ambulatory Visit: Payer: Self-pay

## 2019-05-25 ENCOUNTER — Inpatient Hospital Stay: Payer: Medicare Other | Attending: Hematology and Oncology

## 2019-05-25 ENCOUNTER — Inpatient Hospital Stay (HOSPITAL_BASED_OUTPATIENT_CLINIC_OR_DEPARTMENT_OTHER): Payer: Medicare Other | Admitting: Hematology and Oncology

## 2019-05-25 DIAGNOSIS — Z7189 Other specified counseling: Secondary | ICD-10-CM

## 2019-05-25 DIAGNOSIS — Z794 Long term (current) use of insulin: Secondary | ICD-10-CM

## 2019-05-25 DIAGNOSIS — E1140 Type 2 diabetes mellitus with diabetic neuropathy, unspecified: Secondary | ICD-10-CM

## 2019-05-25 DIAGNOSIS — C482 Malignant neoplasm of peritoneum, unspecified: Secondary | ICD-10-CM | POA: Diagnosis not present

## 2019-05-25 DIAGNOSIS — Z7689 Persons encountering health services in other specified circumstances: Secondary | ICD-10-CM | POA: Insufficient documentation

## 2019-05-25 DIAGNOSIS — T451X5A Adverse effect of antineoplastic and immunosuppressive drugs, initial encounter: Secondary | ICD-10-CM | POA: Diagnosis not present

## 2019-05-25 DIAGNOSIS — G620 Drug-induced polyneuropathy: Secondary | ICD-10-CM | POA: Insufficient documentation

## 2019-05-25 DIAGNOSIS — Z5111 Encounter for antineoplastic chemotherapy: Secondary | ICD-10-CM | POA: Diagnosis present

## 2019-05-25 DIAGNOSIS — D61818 Other pancytopenia: Secondary | ICD-10-CM

## 2019-05-25 DIAGNOSIS — C569 Malignant neoplasm of unspecified ovary: Secondary | ICD-10-CM

## 2019-05-25 DIAGNOSIS — E1165 Type 2 diabetes mellitus with hyperglycemia: Secondary | ICD-10-CM | POA: Diagnosis not present

## 2019-05-25 LAB — CMP (CANCER CENTER ONLY)
ALT: 29 U/L (ref 0–44)
AST: 24 U/L (ref 15–41)
Albumin: 3.7 g/dL (ref 3.5–5.0)
Alkaline Phosphatase: 110 U/L (ref 38–126)
Anion gap: 10 (ref 5–15)
BUN: 18 mg/dL (ref 8–23)
CO2: 24 mmol/L (ref 22–32)
Calcium: 8 mg/dL — ABNORMAL LOW (ref 8.9–10.3)
Chloride: 109 mmol/L (ref 98–111)
Creatinine: 0.81 mg/dL (ref 0.44–1.00)
GFR, Est AFR Am: >60 mL/min (ref >60)
GFR, Estimated: >60 mL/min (ref >60)
Glucose, Bld: 153 mg/dL — ABNORMAL HIGH (ref 70–99)
Potassium: 3.8 mmol/L (ref 3.5–5.1)
Sodium: 143 mmol/L (ref 135–145)
Total Bilirubin: 0.9 mg/dL (ref 0.3–1.2)
Total Protein: 6.9 g/dL (ref 6.5–8.1)

## 2019-05-25 LAB — CBC WITH DIFFERENTIAL (CANCER CENTER ONLY)
Abs Immature Granulocytes: 0.03 10*3/uL (ref 0.00–0.07)
Basophils Absolute: 0 10*3/uL (ref 0.0–0.1)
Basophils Relative: 0 %
Eosinophils Absolute: 0 10*3/uL (ref 0.0–0.5)
Eosinophils Relative: 0 %
HCT: 34.5 % — ABNORMAL LOW (ref 36.0–46.0)
Hemoglobin: 11.2 g/dL — ABNORMAL LOW (ref 12.0–15.0)
Immature Granulocytes: 0 %
Lymphocytes Relative: 14 %
Lymphs Abs: 1 10*3/uL (ref 0.7–4.0)
MCH: 31.7 pg (ref 26.0–34.0)
MCHC: 32.5 g/dL (ref 30.0–36.0)
MCV: 97.7 fL (ref 80.0–100.0)
Monocytes Absolute: 0.5 10*3/uL (ref 0.1–1.0)
Monocytes Relative: 7 %
Neutro Abs: 5.6 10*3/uL (ref 1.7–7.7)
Neutrophils Relative %: 79 %
Platelet Count: 99 10*3/uL — ABNORMAL LOW (ref 150–400)
RBC: 3.53 MIL/uL — ABNORMAL LOW (ref 3.87–5.11)
RDW: 16.5 % — ABNORMAL HIGH (ref 11.5–15.5)
WBC Count: 7.2 10*3/uL (ref 4.0–10.5)
nRBC: 0 % (ref 0.0–0.2)

## 2019-05-25 MED ORDER — HEPARIN SOD (PORK) LOCK FLUSH 100 UNIT/ML IV SOLN
500.0000 [IU] | Freq: Once | INTRAVENOUS | Status: DC
  Filled 2019-05-25: qty 5

## 2019-05-25 MED ORDER — DEXAMETHASONE SODIUM PHOSPHATE 10 MG/ML IJ SOLN
5.0000 mg | Freq: Once | INTRAMUSCULAR | Status: AC
  Administered 2019-05-25: 5 mg via INTRAVENOUS

## 2019-05-25 MED ORDER — DEXAMETHASONE SODIUM PHOSPHATE 10 MG/ML IJ SOLN
INTRAMUSCULAR | Status: AC
  Filled 2019-05-25: qty 1

## 2019-05-25 MED ORDER — SODIUM CHLORIDE 0.9% FLUSH
10.0000 mL | INTRAVENOUS | Status: DC | PRN
  Administered 2019-05-25: 13:00:00 10 mL
  Filled 2019-05-25: qty 10

## 2019-05-25 MED ORDER — HYDROCODONE-ACETAMINOPHEN 5-325 MG PO TABS: 1.0000 | ORAL_TABLET | Freq: Four times a day (QID) | ORAL | 0 refills | Status: DC | PRN

## 2019-05-25 MED ORDER — PEGFILGRASTIM 6 MG/0.6ML ~~LOC~~ PSKT
PREFILLED_SYRINGE | SUBCUTANEOUS | Status: AC
  Filled 2019-05-25: qty 0.6

## 2019-05-25 MED ORDER — PEGFILGRASTIM 6 MG/0.6ML ~~LOC~~ PSKT
6.0000 mg | PREFILLED_SYRINGE | Freq: Once | SUBCUTANEOUS | Status: AC
  Administered 2019-05-25: 6 mg via SUBCUTANEOUS

## 2019-05-25 MED ORDER — HEPARIN SOD (PORK) LOCK FLUSH 100 UNIT/ML IV SOLN
500.0000 [IU] | Freq: Once | INTRAVENOUS | Status: AC | PRN
  Administered 2019-05-25: 500 [IU]
  Filled 2019-05-25: qty 5

## 2019-05-25 MED ORDER — SODIUM CHLORIDE 0.9 % IV SOLN
Freq: Once | INTRAVENOUS | Status: AC
  Filled 2019-05-25: qty 250

## 2019-05-25 MED ORDER — SODIUM CHLORIDE 0.9% FLUSH
10.0000 mL | Freq: Once | INTRAVENOUS | Status: AC
  Administered 2019-05-25: 10 mL
  Filled 2019-05-25: qty 10

## 2019-05-25 MED ORDER — SODIUM CHLORIDE 0.9 % IV SOLN
37.5000 mg/m2 | Freq: Once | INTRAVENOUS | Status: AC
  Administered 2019-05-25: 70 mg via INTRAVENOUS
  Filled 2019-05-25: qty 7

## 2019-05-25 NOTE — Assessment & Plan Note (Signed)
Her last imaging study showed positive response to treatment Her tumor marker which is now improved to within normal limits Clinically, she had positive response to treatment despite significant dose adjustment and aggressive supportive care I recommend we proceed with a few more cycles of treatment and repeat imaging study around end of May of this year

## 2019-05-25 NOTE — Progress Notes (Signed)
[place] OFFICE PROGRESS NOTE  Patient Care Team: Gregor Hams, FNP as PCP - General (Family Medicine) Tat, Eustace Quail, DO as Consulting Physician (Neurology)  ASSESSMENT & PLAN:  Primary peritoneal carcinomatosis [facility]) Her last imaging study showed positive response to treatment Her tumor marker which is now improved to within normal limits Clinically, she had positive response to treatment despite significant dose adjustment and aggressive supportive care I recommend we proceed with a few more cycles of treatment and repeat imaging study around end of May of this year  Pancytopenia, acquired [facility]) She has multifactorial pancytopenia, due to recent chemotherapy, liver disease with splenomegaly as well as history of iron deficiency Her recent EGD showed no evidence of active bleeding She denies recent bleeding complications We will proceed with treatment without dose adjustment  Diabetes mellitus with diabetic neuropathy, with long-term current use of insulin ([place]) She is distressed about her weight gain and poorly controlled diabetes I would defer to her primary care doctor/endocrinologist for further management We discussed lifestyle changes and dietary modification  Chemotherapy-induced neuropathy ([place]) She felt that her neuropathy is stable. For now, she will continue gabapentin and pain medicine as prescribed   No orders of the defined types were placed in this encounter.   All questions were answered. The patient knows to call the clinic with any problems, questions or concerns. The total time spent in the appointment was 20 minutes encounter with patients including review of chart and various tests results, discussions about plan of care and coordination of care plan   [name], MD 05/25/2019 9:47 AM  INTERVAL HISTORY: Please see below for problem oriented charting. 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 Had allergic reaction to carboplatin. Avastin was stopped due to GI hemorrhage   Primary peritoneal carcinomatosis ([place])  09/06/2011 Imaging   CT findings consistent with cirrhotic changes involving the liver.  No worrisome liver mass.  There are associated portal venous collaterals and splenomegaly consistent with portal venous hypertension. 2.  No other significant upper abdominal findings   11/08/2013 Imaging   US showed ascites   11/08/2013 Initial Diagnosis   Patient had ~ 2 months of some urinary incontinence, saw Dr Ronita Hipps in 10-2013, with pelvic mass on exam    11/12/2013 Imaging   There are findings of progressive hepatic cirrhosis with a large amount of ascites and mild splenomegaly. There are venous collaterals present. 2. There is abnormal thickening of the peritoneal surface along the left mid and lower abdominal wall. There is abnormal soft tissue density in the pelvis as well which could reflect the clinically suspected ovarian malignancy but the findings are nondiagnostic. 3. There is a new small left pleural effusion. 4. There is no acute bowel abnormality.   11/18/2013 Imaging   Successful ultrasound guided diagnostic and therapeutic paracentesis yielding 4.1 liters of ascites.    11/18/2013 Pathology Results   PERITONEAL/ASCITIC FLUID (SPECIMEN 1 OF 1 COLLECTED 11/18/13): SEROUS CARCINOMA, PLEASE SEE COMMENT.   12/01/2013 Tumor Marker   Patient's  tumor was tested for the following  markers: CA125 Results of the tumor marker test revealed 1938   12/03/2013 Imaging   Successful ultrasound-guided therapeutic paracentesis yielding 3.6 liters of peritoneal fluid.   12/07/2013 - 05/30/2014 Chemotherapy   She received 3 cycles of carboplatin and Taxol, interrupted cycle 4 for surgery.  She subsequently completed 3 more cycles of chemotherapy after surgery   12/20/2013 Imaging   Successful ultrasound-guided diagnostic and therapeutic paracentesis yielding 2.2 liters of peritoneal fluid   12/20/2013 Pathology Results   PERITONEAL/ASCITIC FLUID(SPECIMEN 1 OF 1 COLLECTED 12/20/13): MALIGNANT CELLS CONSISTENT WITH SEROUS CARCINOMA   01/13/2014 Tumor Marker   Patient's tumor was tested for the following markers: CA125 Results of the tumor marker test revealed 227   02/07/2014 Tumor Marker   Patient's tumor was tested for the following markers: CA125 Results of the tumor marker test revealed 130   02/15/2014 Genetic Testing   Genetics testing from 02-2014 normal ([employer]va[employer] panel)   02/23/2014 Imaging   Interval decrease in ascites. 2. Morphologic changes in the liver consistent with cirrhosis. 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 Portal vein remains patent at this time. 3. Persistent but decreased abnormal soft tissue attenuation tracking in the omental fat. This may be secondary to interval improvement in metastatic disease. 4. Peritoneal thickening seen in the left abdomen and pelvis on the previous study has decreased and nearly resolved in the interval. This also suggests interval improvement in metastatic disease.    03/07/2014 Procedure   Ultrasound and fluoroscopically guided right internal jugular single lumen power port catheter insertion. Tip in the SVC/RA junction. Catheter ready for use.   03/17/2014 Tumor Marker   Patient's tumor was tested for the following markers: CA125 Results of the tumor marker test revealed 45   03/29/2014  Pathology Results   1. Omentum, resection for tumor - HIGH GRADE SEROUS CARCINOMA, SEE COMMENT. 2. Ovary and fallopian tube, right - HIGH GRADE SEROUS CARCINOMA, SEE COMMENT. 3. Ovary and fallopian tube, left - HIGH GRADE SEROUS CARCINOMA, SEE COMMENT. Diagnosis Note 1. Nests and clusters of malignant cells are invading the omental tissue (part #1) with associated fibrosis. The cells are pleomorphic with prominent nucleoli. There are scattered psammoma bodies. The ovaries are atrophic and exhibit multiple foci of surface based invasive carcinoma and associated fibrosis. The fallopian tubes have a few foci of carcinoma, also superficially located. There are no precursor lesions noted in the ovaries or fallopian tubes (the entire tubes and ovaries were submitted for evaluation). While there is some retraction artifact, there are several foci suspicious for lymphovascular invasion. 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 Given the fibrosis, there does appear to be a small amount of treatment effect, however, there is abundant residual tumor.    03/29/2014 Surgery   Procedure(s) Performed: 1. Exploratory laparotomy with bilateral salpingo-oophorectomy, omentectomy radical tumor debulking for ovarian cancer .  Surgeon: Thereasa Solo, MD.  Assistant Surgeon: Lahoma Crocker, M.D. Assistant: (an MD assistant was necessary for tissue manipulation, retraction and positioning due to the complexity of the case and hospital policies).  Operative Findings: 10cm omental cake from hepatic to splenic flexure, densely adherent to transverse colon. Milliary studding of tumor implants (<4m, too numerous in number to count) adherent to the mesentery of the small bowel and small bowel wall. Small volume (100cc) ascites. Small ovaries bilaterally, densely adherent  to the pelvic cul  de sac. Left ovary and tube densely adherent to sigmoid colon. Cirrhotic liver with hepatomegaly and splenomegaly.     This represented an optimal cytoreduction (R1) with visible disease residual on the bowel wall and mesentery (millial, <55m implants).     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 2. 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    08/18/2014 Tumor Marker   Patient's tumor was tested for the following markers: CA125 Results of the tumor marker test revealed 16   10/13/2014 Tumor Marker   Patient's tumor was tested for the following markers: CA125 Results of the tumor marker test revealed 14   11/21/2014 Tumor Marker   Patient's tumor was tested for the following markers: CA125 Results of the tumor marker test revealed 16   12/28/2014 Tumor Marker   Patient's tumor was tested for the following markers: CA125 Results of the tumor marker test revealed 762   02/27/2015 Tumor Marker   Patient's tumor was tested for the following markers: CA125 Results of the tumor marker test revealed 20.2   03/22/2015 Imaging   Filler appearance to the prior exam, with very subtle fluid tracking along portions of the liver, and very minimal nodularity along the right paracolic gutter representing residua from the prior peritoneal cancer. The current abnormalities could simply be post therapy findings rather than necessarily representing residual malignancy. No new or enlarging lesions are identified. 2.  Hepatic cirrhosis and splenomegaly. There is some gastric varices suggesting portal venous hypertension. 3. Left foraminal stenosis at L4-5 due to spurring. There is likely also some central narrowing of the thecal sac at this level. 4. Bibasilar scarring. 5. Chronic mild left mid kidney scarring   03/27/2015 Tumor Marker   Patient's tumor was tested for the following markers: CA125 Results of the tumor marker test revealed 25.3   04/24/2015 Imaging   Disc bulge L2-3 with mild spinal stenosis and right foraminal encroachment. 2. Disc bulge L3-4 with mild spinal stenosis. 3. Multifactorial spinal, left lateral recess and foraminal stenosis L4-5. There is grade 1 anterolisthesis without evident dynamic instability.   05/29/2015 Tumor Marker   Patient's tumor was tested for the following markers: CA125 Results of the tumor marker test revealed 29.9   08/21/2015 Tumor Marker   Patient's tumor was tested for the following markers: CA125 Results of the tumor marker test revealed 45   09/18/2015 Tumor Marker   Patient's tumor was tested for the following markers: CA125 Results of the tumor marker test revealed 47   09/27/2015 Imaging   New small bowel mesenteric nodule and enlarged left external iliac lymph node, highly worrisome for metastatic disease. 2. Cirrhosis and splenomegaly   10/10/2015 Tumor Marker   Patient's tumor was tested for the following markers: CA125 Results of the tumor marker test revealed 53.4   10/10/2015 - 12/11/2015 Chemotherapy   She received 4 cycles of carboplatin only   11/20/2015 Tumor Marker   Patient's tumor was tested for the following markers: CA125 Results of  the tumor marker test revealed 41.3   12/20/2015 Imaging   CT: Mild left external iliac lymphadenopathy is slightly decreased. Mildly enlarged lower mesenteric nodule is slightly decreased. 2. No new or progressive metastatic disease in the abdomen or pelvis. 3. Cirrhosis. Stable subcentimeter hypodense left  liver lobe lesion. No new liver lesions. 4. Stable mild splenomegaly.  No ascites. 5. Mild sigmoid diverticulosis.   01/01/2016 -  Anti-estrogen oral therapy   She was placed on tamoxifen. Had letrozole initially but switched to tamoxifen due to poor tolerance   01/15/2016 Tumor Marker   Patient's tumor was tested for the following markers: CA125 Results of the tumor marker test revealed 31.4   02/19/2016 Tumor Marker   Patient's tumor was tested for the following markers: CA125 Results of the tumor marker test revealed 36.5   05/13/2016 Imaging   No evidence of disease progression within the abdomen or pelvis. Previously noted small central mesenteric nodule and left external iliac node appear slightly smaller. 2. Stable changes of hepatic cirrhosis and portal hypertension with associated splenomegaly. No new or enlarging hepatic lesions are identified. 3. No acute findings.   05/13/2016 Tumor Marker   Patient's tumor was tested for the following markers: CA125 Results of the tumor marker test revealed 41.2   06/24/2016 Tumor Marker   Patient's tumor was tested for the following markers: CA125 Results of the tumor marker test revealed 47.3   08/20/2016 Imaging   1. The left external iliac lymph node is slightly increased in size compared to the prior exam, currently 1.1 cm and previously 0.9 cm in diameter. 2. Stable central mesenteric lymph node at 0.9 cm in diameter. 3. Stable trace free pelvic fluid and stable slight thickening along the right paracolic gutter, without well-defined peritoneal nodularity. 4. Other imaging findings of potential clinical significance: Subsegmental atelectasis or scarring in the lung bases. Hepatic cirrhosis. Left renal scarring. Mild splenomegaly. Sigmoid colon diverticulosis. Impingement at L4-5 due to spondylosis and degenerative disc disease broad Schmorl' s nodes at L3-L4. Pelvic floor laxity   08/23/2016 Tumor Marker   Patient's tumor was tested for the  following markers: CA125 Results of the tumor marker test revealed 80.2   09/04/2016 Imaging   LV EF: 60% -   65%   09/12/2016 Tumor Marker   Patient's tumor was tested for the following markers: CA125 Results of the tumor marker test revealed 98.5   09/12/2016 - 01/29/2018 Chemotherapy   The patient received Doxil and Avastin. Avastin was discontinued in Dec 2019 due to GI hemorrhage   09/26/2016 Tumor Marker   Patient's tumor was tested for the following markers: CA125 Results of the tumor marker test revealed 68.9   10/10/2016 Tumor Marker   Patient's tumor was tested for the following markers: CA125 Results of the tumor marker test revealed 65.6   11/07/2016 Tumor Marker   Patient's tumor was tested for the following markers: CA125 Results of the tumor marker test revealed 46.4   11/29/2016 Imaging   Stable mild peritoneal thickening, suspicious for peritoneal carcinomatosis. No new or progressive disease identified. No evidence of ascites.  No significant change in 10 mm left external iliac and 8 mm mesenteric lymph nodes.  Stable hepatic cirrhosis, and splenomegaly consistent with portal venous hypertension.  Colonic diverticulosis, without radiographic evidence of diverticulitis.   12/02/2016 Imaging   Normal LV size with EF 55%. Basal inferior and basal inferoseptal hypokinesis. Normal RV size and systolic function. No significant valvular abnormalities.   12/05/2016 Tumor Marker  Patient's tumor was tested for the following markers: CA125 Results of the tumor marker test revealed 49.1   01/09/2017 Tumor Marker   Patient's tumor was tested for the following markers: CA125 Results of the tumor marker test revealed 47.2   02/06/2017 Tumor Marker   Patient's tumor was tested for the following markers: CA125 Results of the tumor marker test revealed 44.1   02/18/2017 Imaging   Stable mild peritoneal thickening. No new or progressive disease identified within the abdomen  or pelvis.  Stable tiny sub-cm left external iliac and mesenteric lymph nodes.  Stable hepatic cirrhosis, and splenomegaly consistent with portal venous hypertension. No evidence of hepatic neoplasm.  Colonic diverticulosis, without radiographic evidence of diverticulitis.   03/06/2017 Tumor Marker   Patient's tumor was tested for the following markers: CA125 Results of the tumor marker test revealed 50.4   03/17/2017 Imaging   - Left ventricle: The cavity size was normal. Wall thickness was normal. Systolic function was normal. The estimated ejection fraction was in the range of 55% to 60%. Wall motion was normal; there were no regional wall motion abnormalities. Features are consistent with a pseudonormal left ventricular filling pattern, with concomitant abnormal relaxation and increased filling pressure (grade 2 diastolic dysfunction). - Mitral valve: There was mild regurgitation. - Left atrium: The atrium was mildly dilated   04/03/2017 Tumor Marker   Patient's tumor was tested for the following markers: CA125 Results of the tumor marker test revealed 47.2   05/14/2017 Imaging   CT scan of abdomen and pelvis Stable mild peritoneal thickening. No new or progressive disease within the abdomen or pelvis.  Stable hepatic cirrhosis. Stable splenomegaly, consistent with portal venous hypertension. No evidence of hepatic neoplasm.   05/15/2017 Tumor Marker   Patient's tumor was tested for the following markers: CA125 Results of the tumor marker test revealed 56.1   06/12/2017 Tumor Marker   Patient's tumor was tested for the following markers: CA125 Results of the tumor marker test revealed 49.2   07/10/2017 Tumor Marker   Patient's tumor was tested for the following markers: CA125 Results of the tumor marker test revealed 46.3   08/07/2017 Tumor Marker   Patient's tumor was tested for the following markers: CA125 Results of the tumor marker test revealed 52.9   08/20/2017 Imaging   1.  Mild omental/peritoneal haziness, unchanged. No evidence of new metastatic disease. 2. Cirrhosis with splenomegaly.   09/04/2017 Tumor Marker   Patient's tumor was tested for the following markers: CA125 Results of the tumor marker test revealed 48.5   11/13/2017 Tumor Marker   Patient's tumor was tested for the following markers: CA125 Results of the tumor marker test revealed 55.8   12/17/2017 Imaging   1. No findings to suggest metastatic disease in the abdomen or pelvis. 2. Severe hepatic cirrhosis with evidence of portal hypertension, as demonstrated by dilated portal vein, splenomegaly and portosystemic collateral pathways, including gastric and esophageal varices.  3. Colonic diverticulosis without evidence of acute diverticulitis at this time. 4. Additional incidental findings, as above.   01/16/2018 Tumor Marker   Patient's tumor was tested for the following markers: CA125 Results of the tumor marker test revealed 63.7   02/10/2018 Procedure   EGD - Recently bleeding grade III and large (> 5 mm) esophageal varices. Completely eradicated. Banded. - Portal hypertensive gastropathy. - Normal examined duodenum. - No specimens collected   02/13/2018 Tumor Marker   Patient's tumor was tested for the following markers: CA125 Results of the tumor marker  test revealed 82.7   02/25/2018 Imaging   Bone density showed mild osteopenia   04/08/2018 Tumor Marker   Patient's tumor was tested for the following markers: CA125 Results of the tumor marker test revealed 82.7   04/08/2018 Imaging   1. No definite omental or peritoneal surface lesions. However, there are 2 slowly enlarging lymph nodes noted. One is in the small bowel mesentery and the other is in the left deep pelvis. Could not exclude recurrent disease. 2. Stable advanced cirrhotic changes involving the liver with portal venous hypertension, portal venous collaterals, esophageal varices and splenomegaly. No worrisome hepatic  lesions. 3. Diffuse wall thickening of the colon could suggest diffuse colitis or could be due to low albumin. Recommend correlation with any symptoms such as diarrhea.   05/21/2018 Tumor Marker   Patient's tumor was tested for the following markers: CA125 Results of the tumor marker test revealed 86   09/07/2018 Tumor Marker   Patient's tumor was tested for the following markers:CA-125 Results of the tumor marker test revealed 111   09/29/2018 Tumor Marker   Patient's tumor was tested for the following markers: CA-125 Results of the tumor marker test revealed 121.   09/29/2018 - 11/24/2018 Chemotherapy   The patient had carboplatin and gemzar for chemotherapy treatment.  Chemo was stopped due to allergic reaction to carboplatin   10/27/2018 Tumor Marker   Patient's tumor was tested for the following markers: CA-125 Results of the tumor marker test revealed 99.9   11/24/2018 Tumor Marker   Patient's tumor was tested for the following markers: CA-125 Results of the tumor marker test revealed 81.2   12/14/2018 Tumor Marker   Patient's tumor was tested for the following markers: CA-125 Results of the tumor marker test revealed 81.2   12/14/2018 Imaging   1. Mild improvement in peritoneal disease. Peritoneal nodule within the left posterior pelvis contiguous with the left side of vaginal cuff is slightly decreased in size in the interval. Within the right iliac fossa there is a small peritoneal nodule which has decreased in size in the interval. Central small bowel mesenteric lymph node is not significantly changed. Slight decrease in size left periaortic lymph node.    2. Morphologic features of the liver compatible with cirrhosis. Splenomegaly is noted which may reflect portal venous hypertension.   12/21/2018 Tumor Marker   Patient's tumor was tested for the following markers: CA-125 Results of the tumor marker test revealed 64.2   12/21/2018 -  Chemotherapy   The patient had taxotere  for chemotherapy treatment.     03/02/2019 Tumor Marker   Patient's tumor was tested for the following markers: CA-125. 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   No new or progressive metastatic disease within the abdomen or pelvis.   Hepatic cirrhosis and findings of portal venous hypertension. 1.1 cm low-attenuation lesion within left hepatic lobe, which could represent a regenerative nodule or small hepatocellular carcinoma. Recommend abdomen MRI without and with contrast for further characterization.     04/13/2019 Tumor Marker   Patient's tumor was tested for the following markers: CA-125 Results of the tumor marker test revealed 36.8     REVIEW OF SYSTEMS:   Constitutional: Denies fevers, chills or abnormal weight loss Eyes: Denies blurriness of vision Ears, nose, mouth, throat, and face: Denies mucositis or sore throat Respiratory: Denies cough, dyspnea or wheezes Cardiovascular: Denies palpitation, chest discomfort  Gastrointestinal:  Denies nausea, heartburn or change in bowel habits Skin: Denies abnormal skin rashes Lymphatics: Denies new lymphadenopathy or easy bruising Behavioral/Psych: Mood is stable, no new changes  All other systems were reviewed with the patient and are negative.  I have reviewed the past medical history, past surgical history, social history and family history with the patient and they are unchanged from previous note.  ALLERGIES:  is allergic to carboplatin; shellfish allergy; vancomycin; benadryl [diphenhydramine hcl]; and penicillins.  MEDICATIONS:  Current Outpatient Medications  Medication Sig Dispense Refill  . cholecalciferol (VITAMIN D) 1000 units tablet Take 1,000 Units by mouth at bedtime.     Marland Kitchen dexamethasone (DECADRON) 4 MG tablet Start the day before Taxotere, take 1 tablet in the morning, and then 1 tablet daily after chemo for 2  days. Pls dispense 30 tabs (Patient taking differently: Take 4 mg by mouth See admin instructions. Start the day before Taxotere, take 1 tablet in the morning, and then 1 tablet daily after chemo for 2 days. Pls dispense 30 tabs) 30 tablet 1  . gabapentin (NEURONTIN) 300 MG capsule Take 3 capsules (900 mg total) by mouth 2 (two) times daily. 120 capsule 11  . HUMALOG MIX 75/25 KWIKPEN (75-25) 100 UNIT/ML Kwikpen Inject 10 Units into the skin 2 (two) times daily. Before breakfast & before supper Except when taking Chemo take 15 units  For five days    . HYDROcodone-acetaminophen (NORCO/VICODIN) 5-325 MG tablet Take 1 tablet by mouth every 6 (six) hours as needed for moderate pain. 60 tablet 0  . lidocaine-prilocaine (EMLA) cream Apply to Porta-cath  1-2 hours prior to access as directed. (Patient taking differently: Apply 1 application topically daily as needed (port access). ) 30 g 9  . nadolol (CORGARD) 20 MG tablet Take 1 tablet (20 mg total) by mouth daily. (Patient taking differently: Take 20 mg by mouth at bedtime. ) 90 tablet 3  . ondansetron (ZOFRAN) 8 MG tablet Take 1 tablet (8 mg total) by mouth every 8 (eight) hours as needed. Start on the third day after chemotherapy. (Patient taking differently: Take 8 mg by mouth every 8 (eight) hours as needed for nausea. Start on the third day after chemotherapy.) 30 tablet 1  . pantoprazole (PROTONIX) 40 MG tablet Take 1 tablet (40 mg total) by mouth daily.    Marland Kitchen PROAIR HFA 108 (90 Base) MCG/ACT inhaler Inhale 2 puffs into the lungs every 4 (four) hours as needed for wheezing or shortness of breath.     . prochlorperazine (COMPAZINE) 10 MG tablet Take 1 tablet (10 mg total) by mouth every 6 (six) hours as needed (Nausea or vomiting). 30 tablet 1  . rotigotine (NEUPRO) 4 MG/24HR Place 1 patch onto the skin daily. 30 patch 5   No current facility-administered medications for this visit.    PHYSICAL EXAMINATION: ECOG PERFORMANCE STATUS: 0 -  Asymptomatic  Vitals:   05/25/19 0927  BP: (!) 145/62  Pulse: 69  Resp: 18  Temp: 97.8 F (36.6 C)  SpO2: 100%   Filed Weights   05/25/19 0927  Weight: 198 lb 3.2 oz (89.9 kg)    GENERAL:alert, no distress and comfortable HEART: Noted mild bilateral lower extremity edema   LABORATORY DATA:  I have reviewed the data as listed    Component Value Date/Time   NA 143 05/25/2019 0853   NA 142 02/06/2017 0742   K 3.8 05/25/2019 0853   K 3.9 02/06/2017 0742   CL 109 05/25/2019 0853   CO2  24 05/25/2019 0853   CO2 22 02/06/2017 0742   GLUCOSE 153 (H) 05/25/2019 0853   GLUCOSE 156 (H) 02/06/2017 0742   BUN 18 05/25/2019 0853   BUN 13.5 02/06/2017 0742   CREATININE 0.81 05/25/2019 0853   CREATININE 0.8 02/06/2017 0742   CALCIUM 8.0 (L) 05/25/2019 0853   CALCIUM 8.5 02/06/2017 0742   PROT 6.9 05/25/2019 0853   PROT 6.9 02/06/2017 0742   ALBUMIN 3.7 05/25/2019 0853   ALBUMIN 3.7 02/06/2017 0742   AST 24 05/25/2019 0853   AST 47 (H) 02/06/2017 0742   ALT 29 05/25/2019 0853   ALT 54 02/06/2017 0742   ALKPHOS 110 05/25/2019 0853   ALKPHOS 130 02/06/2017 0742   BILITOT 0.9 05/25/2019 0853   BILITOT 0.65 02/06/2017 0742   GFRNONAA >60 05/25/2019 0853   GFRAA >60 05/25/2019 0853    No results found for: SPEP, UPEP  Lab Results  Component Value Date   WBC 7.2 05/25/2019   NEUTROABS 5.6 05/25/2019   HGB 11.2 (L) 05/25/2019   HCT 34.5 (L) 05/25/2019   MCV 97.7 05/25/2019   PLT 99 (L) 05/25/2019      Chemistry      Component Value Date/Time   NA 143 05/25/2019 0853   NA 142 02/06/2017 0742   K 3.8 05/25/2019 0853   K 3.9 02/06/2017 0742   CL 109 05/25/2019 0853   CO2 24 05/25/2019 0853   CO2 22 02/06/2017 0742   BUN 18 05/25/2019 0853   BUN 13.5 02/06/2017 0742   CREATININE 0.81 05/25/2019 0853   CREATININE 0.8 02/06/2017 0742      Component Value Date/Time   CALCIUM 8.0 (L) 05/25/2019 0853   CALCIUM 8.5 02/06/2017 0742   ALKPHOS 110 05/25/2019 0853    ALKPHOS 130 02/06/2017 0742   AST 24 05/25/2019 0853   AST 47 (H) 02/06/2017 0742   ALT 29 05/25/2019 0853   ALT 54 02/06/2017 0742   BILITOT 0.9 05/25/2019 0853   BILITOT 0.65 02/06/2017 9244

## 2019-05-25 NOTE — Assessment & Plan Note (Signed)
She is distressed about her weight gain and poorly controlled diabetes I would defer to her primary care doctor/endocrinologist for further management We discussed lifestyle changes and dietary modification

## 2019-05-25 NOTE — Patient Instructions (Signed)
[facility] Discharge Instructions for Patients Receiving Chemotherapy  Today you received the following chemotherapy agents Taxotere  To help prevent nausea and vomiting after your treatment, we encourage you to take your nausea medication as directed.    If you develop nausea and vomiting that is not controlled by your nausea medication, call the clinic.   BELOW ARE SYMPTOMS THAT SHOULD BE REPORTED IMMEDIATELY:  *FEVER GREATER THAN 100.5 F  *CHILLS WITH OR WITHOUT FEVER  NAUSEA AND VOMITING THAT IS NOT CONTROLLED WITH YOUR NAUSEA MEDICATION  *UNUSUAL SHORTNESS OF BREATH  *UNUSUAL BRUISING OR BLEEDING  TENDERNESS IN MOUTH AND THROAT WITH OR WITHOUT PRESENCE OF ULCERS  *URINARY PROBLEMS  *BOWEL PROBLEMS  UNUSUAL RASH Items with * indicate a potential emergency and should be followed up as soon as possible.  Feel free to call the clinic should you have any questions or concerns. The clinic phone number is [phone].  Please show the [facility] at check-in to the Emergency Department and triage nurse.

## 2019-05-25 NOTE — Assessment & Plan Note (Signed)
She has multifactorial pancytopenia, due to recent chemotherapy, liver disease with splenomegaly as well as history of iron deficiency Her recent EGD showed no evidence of active bleeding She denies recent bleeding complications We will proceed with treatment without dose adjustment

## 2019-05-25 NOTE — Assessment & Plan Note (Signed)
She felt that her neuropathy is stable. For now, she will continue gabapentin and pain medicine as prescribed

## 2019-05-26 ENCOUNTER — Telehealth: Payer: Self-pay | Admitting: Hematology and Oncology

## 2019-05-26 LAB — CA 125: Cancer Antigen (CA) 125: 31.6 U/mL (ref 0.0–38.1)

## 2019-05-26 NOTE — Telephone Encounter (Signed)
No 4/13 los/sch msg. No changes made to pt's schedule.

## 2019-06-09 NOTE — Progress Notes (Signed)
Pharmacist Chemotherapy Monitoring - Follow Up Assessment    I verify that I have reviewed each item in the below checklist:  . Regimen for the patient is scheduled for the appropriate day and plan matches scheduled date. Marland Kitchen Appropriate non-routine labs are ordered dependent on drug ordered. . If applicable, additional medications reviewed and ordered per protocol based on lifetime cumulative doses and/or treatment regimen.   Plan for follow-up and/or issues identified: No . I-vent associated with next due treatment: No . MD and/or nursing notified: No  Garner Dullea K 06/09/2019 2:26 PM

## 2019-06-15 ENCOUNTER — Other Ambulatory Visit: Payer: Self-pay

## 2019-06-15 ENCOUNTER — Inpatient Hospital Stay: Payer: Medicare Other

## 2019-06-15 ENCOUNTER — Encounter: Payer: Self-pay | Admitting: Hematology and Oncology

## 2019-06-15 ENCOUNTER — Other Ambulatory Visit: Payer: Self-pay | Admitting: Hematology and Oncology

## 2019-06-15 ENCOUNTER — Inpatient Hospital Stay (HOSPITAL_BASED_OUTPATIENT_CLINIC_OR_DEPARTMENT_OTHER): Payer: Medicare Other | Admitting: Hematology and Oncology

## 2019-06-15 ENCOUNTER — Inpatient Hospital Stay: Payer: Medicare Other | Attending: Hematology and Oncology

## 2019-06-15 VITALS — BP 131/69 | HR 68 | Temp 98.3°F | Resp 18 | Ht 62.0 in | Wt 194.7 lb

## 2019-06-15 DIAGNOSIS — E1165 Type 2 diabetes mellitus with hyperglycemia: Secondary | ICD-10-CM | POA: Insufficient documentation

## 2019-06-15 DIAGNOSIS — T451X5A Adverse effect of antineoplastic and immunosuppressive drugs, initial encounter: Secondary | ICD-10-CM | POA: Diagnosis not present

## 2019-06-15 DIAGNOSIS — C569 Malignant neoplasm of unspecified ovary: Secondary | ICD-10-CM

## 2019-06-15 DIAGNOSIS — Z7189 Other specified counseling: Secondary | ICD-10-CM

## 2019-06-15 DIAGNOSIS — Z5111 Encounter for antineoplastic chemotherapy: Secondary | ICD-10-CM | POA: Diagnosis present

## 2019-06-15 DIAGNOSIS — C482 Malignant neoplasm of peritoneum, unspecified: Secondary | ICD-10-CM

## 2019-06-15 DIAGNOSIS — E1140 Type 2 diabetes mellitus with diabetic neuropathy, unspecified: Secondary | ICD-10-CM | POA: Insufficient documentation

## 2019-06-15 DIAGNOSIS — K769 Liver disease, unspecified: Secondary | ICD-10-CM | POA: Diagnosis not present

## 2019-06-15 DIAGNOSIS — K7460 Unspecified cirrhosis of liver: Secondary | ICD-10-CM | POA: Insufficient documentation

## 2019-06-15 DIAGNOSIS — Z9221 Personal history of antineoplastic chemotherapy: Secondary | ICD-10-CM | POA: Insufficient documentation

## 2019-06-15 DIAGNOSIS — Z79899 Other long term (current) drug therapy: Secondary | ICD-10-CM | POA: Diagnosis not present

## 2019-06-15 DIAGNOSIS — R161 Splenomegaly, not elsewhere classified: Secondary | ICD-10-CM | POA: Insufficient documentation

## 2019-06-15 DIAGNOSIS — Z794 Long term (current) use of insulin: Secondary | ICD-10-CM | POA: Insufficient documentation

## 2019-06-15 DIAGNOSIS — D61818 Other pancytopenia: Secondary | ICD-10-CM | POA: Insufficient documentation

## 2019-06-15 DIAGNOSIS — D6481 Anemia due to antineoplastic chemotherapy: Secondary | ICD-10-CM | POA: Diagnosis not present

## 2019-06-15 LAB — CMP (CANCER CENTER ONLY)
ALT: 27 U/L (ref 0–44)
AST: 18 U/L (ref 15–41)
Albumin: 3.7 g/dL (ref 3.5–5.0)
Alkaline Phosphatase: 134 U/L — ABNORMAL HIGH (ref 38–126)
Anion gap: 8 (ref 5–15)
BUN: 16 mg/dL (ref 8–23)
CO2: 24 mmol/L (ref 22–32)
Calcium: 8.1 mg/dL — ABNORMAL LOW (ref 8.9–10.3)
Chloride: 111 mmol/L (ref 98–111)
Creatinine: 0.88 mg/dL (ref 0.44–1.00)
GFR, Est AFR Am: >60 mL/min (ref >60)
GFR, Estimated: >60 mL/min (ref >60)
Glucose, Bld: 234 mg/dL — ABNORMAL HIGH (ref 70–99)
Potassium: 3.8 mmol/L (ref 3.5–5.1)
Sodium: 143 mmol/L (ref 135–145)
Total Bilirubin: 0.6 mg/dL (ref 0.3–1.2)
Total Protein: 6.6 g/dL (ref 6.5–8.1)

## 2019-06-15 LAB — CBC WITH DIFFERENTIAL (CANCER CENTER ONLY)
Abs Immature Granulocytes: 0.02 10*3/uL (ref 0.00–0.07)
Basophils Absolute: 0 10*3/uL (ref 0.0–0.1)
Basophils Relative: 0 %
Eosinophils Absolute: 0 10*3/uL (ref 0.0–0.5)
Eosinophils Relative: 1 %
HCT: 34 % — ABNORMAL LOW (ref 36.0–46.0)
Hemoglobin: 11 g/dL — ABNORMAL LOW (ref 12.0–15.0)
Immature Granulocytes: 0 %
Lymphocytes Relative: 12 %
Lymphs Abs: 0.8 10*3/uL (ref 0.7–4.0)
MCH: 31.9 pg (ref 26.0–34.0)
MCHC: 32.4 g/dL (ref 30.0–36.0)
MCV: 98.6 fL (ref 80.0–100.0)
Monocytes Absolute: 0.4 10*3/uL (ref 0.1–1.0)
Monocytes Relative: 7 %
Neutro Abs: 5.2 10*3/uL (ref 1.7–7.7)
Neutrophils Relative %: 80 %
Platelet Count: 86 10*3/uL — ABNORMAL LOW (ref 150–400)
RBC: 3.45 MIL/uL — ABNORMAL LOW (ref 3.87–5.11)
RDW: 16.4 % — ABNORMAL HIGH (ref 11.5–15.5)
WBC Count: 6.5 10*3/uL (ref 4.0–10.5)
nRBC: 0 % (ref 0.0–0.2)

## 2019-06-15 MED ORDER — HEPARIN SOD (PORK) LOCK FLUSH 100 UNIT/ML IV SOLN
500.0000 [IU] | Freq: Once | INTRAVENOUS | Status: AC | PRN
  Administered 2019-06-15: 500 [IU]
  Filled 2019-06-15: qty 5

## 2019-06-15 MED ORDER — SODIUM CHLORIDE 0.9% FLUSH
10.0000 mL | Freq: Once | INTRAVENOUS | Status: AC
  Administered 2019-06-15: 10 mL
  Filled 2019-06-15: qty 10

## 2019-06-15 MED ORDER — DEXAMETHASONE SODIUM PHOSPHATE 10 MG/ML IJ SOLN
5.0000 mg | Freq: Once | INTRAMUSCULAR | Status: AC
  Administered 2019-06-15: 11:00:00 5 mg via INTRAVENOUS

## 2019-06-15 MED ORDER — SODIUM CHLORIDE 0.9% FLUSH
10.0000 mL | INTRAVENOUS | Status: DC | PRN
  Administered 2019-06-15: 13:00:00 10 mL
  Filled 2019-06-15: qty 10

## 2019-06-15 MED ORDER — SODIUM CHLORIDE 0.9 % IV SOLN
Freq: Once | INTRAVENOUS | Status: AC
  Filled 2019-06-15: qty 250

## 2019-06-15 MED ORDER — PEGFILGRASTIM 6 MG/0.6ML ~~LOC~~ PSKT
PREFILLED_SYRINGE | SUBCUTANEOUS | Status: AC
  Filled 2019-06-15: qty 0.6

## 2019-06-15 MED ORDER — SODIUM CHLORIDE 0.9 % IV SOLN
37.5000 mg/m2 | Freq: Once | INTRAVENOUS | Status: AC
  Administered 2019-06-15: 70 mg via INTRAVENOUS
  Filled 2019-06-15: qty 7

## 2019-06-15 MED ORDER — DEXAMETHASONE SODIUM PHOSPHATE 10 MG/ML IJ SOLN
INTRAMUSCULAR | Status: AC
  Filled 2019-06-15: qty 1

## 2019-06-15 MED ORDER — PEGFILGRASTIM 6 MG/0.6ML ~~LOC~~ PSKT
6.0000 mg | PREFILLED_SYRINGE | Freq: Once | SUBCUTANEOUS | Status: AC
  Administered 2019-06-15: 6 mg via SUBCUTANEOUS

## 2019-06-15 NOTE — Progress Notes (Signed)
[place] OFFICE PROGRESS NOTE  Patient Care Team: Gregor Hams, FNP as PCP - General (Family Medicine) Tat, Eustace Quail, DO as Consulting Physician (Neurology)  ASSESSMENT & PLAN:  Primary peritoneal carcinomatosis [facility]) Her last imaging study showed positive response to treatment Her tumor marker which is now improved to within normal limits Clinically, she had positive response to treatment despite significant dose adjustment and aggressive supportive care I plan to repeat imaging study at the end of the month for further follow-up  Pancytopenia, acquired [facility]) She has multifactorial pancytopenia, due to recent chemotherapy, liver disease with splenomegaly as well as history of iron deficiency Her recent EGD showed no evidence of active bleeding She denies recent bleeding complications We will proceed with treatment without dose adjustment  Diabetes mellitus with diabetic neuropathy, with long-term current use of insulin ([place]) Her blood sugar is elevated She will continue further management with primary care doctor/endocrinologist   Orders Placed This Encounter  Procedures  . CT ABDOMEN PELVIS W CONTRAST    Standing Status:   Future    Standing Expiration Date:   06/14/2020    Order Specific Question:   If indicated for the ordered procedure, I authorize the administration of contrast media per Radiology protocol    Answer:   Yes    Order Specific Question:   Preferred imaging location?    Answer:   [facility]    Order Specific Question:   Radiology Contrast Protocol - do NOT remove file path    Answer:   [email or web address]    All questions were answered. The patient knows to call the clinic with any problems, questions or concerns. The total time spent in the appointment was 20 minutes encounter with patients including review of chart and various tests results, discussions about plan of care and coordination of care  plan   [name], MD 06/15/2019 2:07 PM  INTERVAL HISTORY: Please see below for problem oriented charting. She returns for further chemotherapy and follow-up She denies worsening neuropathy No recent nausea or changes in bowel habits The patient denies any recent signs or symptoms of bleeding such as spontaneous epistaxis, hematuria or hematochezia. 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 Had allergic reaction to carboplatin. Avastin was stopped due to GI hemorrhage   Primary peritoneal carcinomatosis ([place])  09/06/2011 Imaging   CT findings consistent with cirrhotic changes involving the liver.  No worrisome liver mass.  There are associated portal venous collaterals and splenomegaly consistent with portal venous hypertension. 2.  No other significant upper abdominal findings   11/08/2013 Imaging   US showed ascites   11/08/2013 Initial Diagnosis   Patient had ~ 2 months of some urinary incontinence, saw Dr Ronita Hipps in 10-2013, with pelvic mass on exam    11/12/2013 Imaging   There are findings of progressive hepatic cirrhosis with a large amount of ascites and mild splenomegaly. There are venous collaterals present. 2. There is abnormal thickening of the peritoneal surface along the left mid and lower abdominal wall. There is abnormal soft tissue density in the pelvis as well which could reflect the clinically suspected ovarian malignancy but the findings are nondiagnostic. 3. There is a new small left pleural effusion. 4. There is no acute bowel abnormality.   11/18/2013 Imaging   Successful ultrasound guided diagnostic and therapeutic paracentesis  yielding 4.1 liters of ascites.    11/18/2013 Pathology Results   PERITONEAL/ASCITIC FLUID (SPECIMEN 1 OF 1 COLLECTED 11/18/13): SEROUS CARCINOMA, PLEASE SEE  COMMENT.   12/01/2013 Tumor Marker   Patient's tumor was tested for the following markers: CA125 Results of the tumor marker test revealed 1938   12/03/2013 Imaging   Successful ultrasound-guided therapeutic paracentesis yielding 3.6 liters of peritoneal fluid.   12/07/2013 - 05/30/2014 Chemotherapy   She received 3 cycles of carboplatin and Taxol, interrupted cycle 4 for surgery.  She subsequently completed 3 more cycles of chemotherapy after surgery   12/20/2013 Imaging   Successful ultrasound-guided diagnostic and therapeutic paracentesis yielding 2.2 liters of peritoneal fluid   12/20/2013 Pathology Results   PERITONEAL/ASCITIC FLUID(SPECIMEN 1 OF 1 COLLECTED 12/20/13): MALIGNANT CELLS CONSISTENT WITH SEROUS CARCINOMA   01/13/2014 Tumor Marker   Patient's tumor was tested for the following markers: CA125 Results of the tumor marker test revealed 227   02/07/2014 Tumor Marker   Patient's tumor was tested for the following markers: CA125 Results of the tumor marker test revealed 130   02/15/2014 Genetic Testing   Genetics testing from 02-2014 normal ([employer] panel)   02/23/2014 Imaging   Interval decrease in ascites. 2. Morphologic changes in the liver consistent with cirrhosis. 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 Portal vein remains patent at this time. 3. Persistent but decreased abnormal soft tissue attenuation tracking in the omental fat. This may be secondary to interval improvement in metastatic disease. 4. Peritoneal thickening seen in the left abdomen and pelvis on the previous study has decreased and nearly resolved in the interval. This also suggests interval improvement in metastatic disease.    03/07/2014 Procedure   Ultrasound and fluoroscopically guided right internal jugular single lumen power port catheter insertion. Tip in the SVC/RA junction. Catheter ready for use.   03/17/2014 Tumor Marker   Patient's tumor was tested for the  following markers: CA125 Results of the tumor marker test revealed 45   03/29/2014 Pathology Results   1. Omentum, resection for tumor - HIGH GRADE SEROUS CARCINOMA, SEE COMMENT. 2. Ovary and fallopian tube, right - HIGH GRADE SEROUS CARCINOMA, SEE COMMENT. 3. Ovary and fallopian tube, left - HIGH GRADE SEROUS CARCINOMA, SEE COMMENT. Diagnosis Note 1. Nests and clusters of malignant cells are invading the omental tissue (part #1) with associated fibrosis. The cells are pleomorphic with prominent nucleoli. There are scattered psammoma bodies. The ovaries are atrophic and exhibit multiple foci of surface based invasive carcinoma and associated fibrosis. The fallopian tubes have a few foci of carcinoma, also superficially located. There are no precursor lesions noted in the ovaries or fallopian tubes (the entire tubes and ovaries were submitted for evaluation). While there is some retraction artifact, there are several foci suspicious for lymphovascular invasion. 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 Given the fibrosis, there does appear to be a small amount of treatment effect, however, there is abundant residual tumor.    03/29/2014 Surgery   Procedure(s) Performed: 1. Exploratory laparotomy with bilateral salpingo-oophorectomy, omentectomy radical tumor debulking for ovarian cancer .  Surgeon: Thereasa Solo, MD.  Assistant Surgeon: Lahoma Crocker, M.D. Assistant: (an MD assistant was necessary for tissue manipulation, retraction and positioning due to the complexity of the case and hospital policies).  Operative Findings: 10cm omental cake from hepatic to splenic flexure, densely adherent to transverse  colon. Milliary studding of tumor implants (<5m, too numerous in number to count) adherent to the mesentery of the small bowel and small bowel wall. Small  volume (100cc) ascites. Small ovaries bilaterally, densely adherent to the pelvic cul de sac. Left ovary and tube densely adherent to sigmoid colon. Cirrhotic liver with hepatomegaly and splenomegaly.     This represented an optimal cytoreduction (R1) with visible disease residual on the bowel wall and mesentery (millial, <313mimplants).     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 2. 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    08/18/2014 Tumor Marker   Patient's tumor was tested for the following markers: CA125 Results of the tumor marker test revealed 16   10/13/2014 Tumor Marker   Patient's tumor was tested for the following markers: CA125 Results of the tumor marker test revealed 14   11/21/2014 Tumor Marker   Patient's tumor was tested for the following markers: CA125 Results of the tumor marker test revealed 16   12/28/2014 Tumor Marker   Patient's tumor was tested for the following markers: CA125 Results of the tumor marker test revealed 762   02/27/2015 Tumor Marker   Patient's tumor was tested for the following markers: CA125 Results of the tumor marker test revealed 20.2   03/22/2015 Imaging   Filler appearance to the prior exam, with very subtle fluid tracking along portions of the liver, and very minimal nodularity along the right paracolic gutter representing residua from the prior peritoneal cancer. The current abnormalities could simply be post therapy findings rather than necessarily  representing residual malignancy. No new or enlarging lesions are identified. 2. Hepatic cirrhosis and splenomegaly. There is some gastric varices suggesting portal venous hypertension. 3. Left foraminal stenosis at L4-5 due to spurring. There is likely also some central narrowing of the thecal sac at this level. 4. Bibasilar scarring. 5. Chronic mild left mid kidney scarring   03/27/2015 Tumor Marker   Patient's tumor was tested for the following markers: CA125 Results of the tumor marker test revealed 25.3   04/24/2015 Imaging   Disc bulge L2-3 with mild spinal stenosis and right foraminal encroachment. 2. Disc bulge L3-4 with mild spinal stenosis. 3. Multifactorial spinal, left lateral recess and foraminal stenosis L4-5. There is grade 1 anterolisthesis without evident dynamic instability.   05/29/2015 Tumor Marker   Patient's tumor was tested for the following markers: CA125 Results of the tumor marker test revealed 29.9   08/21/2015 Tumor Marker   Patient's tumor was tested for the following markers: CA125 Results of the tumor marker test revealed 45   09/18/2015 Tumor Marker   Patient's tumor was tested for the following markers: CA125 Results of the tumor marker test revealed 47   09/27/2015 Imaging   New small bowel mesenteric nodule and enlarged left external iliac lymph node, highly worrisome for metastatic disease. 2. Cirrhosis and splenomegaly   10/10/2015 Tumor Marker   Patient's tumor was tested for the following markers: CA125 Results of the tumor marker test revealed  53.4   10/10/2015 - 12/11/2015 Chemotherapy   She received 4 cycles of carboplatin only   11/20/2015 Tumor Marker   Patient's tumor was tested for the following markers: CA125 Results of the tumor marker test revealed 41.3   12/20/2015 Imaging   CT: Mild left external iliac lymphadenopathy is slightly decreased. Mildly enlarged lower mesenteric nodule is slightly decreased. 2. No new or progressive metastatic  disease in the abdomen or pelvis. 3. Cirrhosis. Stable subcentimeter hypodense left liver lobe lesion. No new liver lesions. 4. Stable mild splenomegaly.  No ascites. 5. Mild sigmoid diverticulosis.   01/01/2016 -  Anti-estrogen oral therapy   She was placed on tamoxifen. Had letrozole initially but switched to tamoxifen due to poor tolerance   01/15/2016 Tumor Marker   Patient's tumor was tested for the following markers: CA125 Results of the tumor marker test revealed 31.4   02/19/2016 Tumor Marker   Patient's tumor was tested for the following markers: CA125 Results of the tumor marker test revealed 36.5   05/13/2016 Imaging   No evidence of disease progression within the abdomen or pelvis. Previously noted small central mesenteric nodule and left external iliac node appear slightly smaller. 2. Stable changes of hepatic cirrhosis and portal hypertension with associated splenomegaly. No new or enlarging hepatic lesions are identified. 3. No acute findings.   05/13/2016 Tumor Marker   Patient's tumor was tested for the following markers: CA125 Results of the tumor marker test revealed 41.2   06/24/2016 Tumor Marker   Patient's tumor was tested for the following markers: CA125 Results of the tumor marker test revealed 47.3   08/20/2016 Imaging   1. The left external iliac lymph node is slightly increased in size compared to the prior exam, currently 1.1 cm and previously 0.9 cm in diameter. 2. Stable central mesenteric lymph node at 0.9 cm in diameter. 3. Stable trace free pelvic fluid and stable slight thickening along the right paracolic gutter, without well-defined peritoneal nodularity. 4. Other imaging findings of potential clinical significance: Subsegmental atelectasis or scarring in the lung bases. Hepatic cirrhosis. Left renal scarring. Mild splenomegaly. Sigmoid colon diverticulosis. Impingement at L4-5 due to spondylosis and degenerative disc disease broad Schmorl' s nodes at L3-L4.  Pelvic floor laxity   08/23/2016 Tumor Marker   Patient's tumor was tested for the following markers: CA125 Results of the tumor marker test revealed 80.2   09/04/2016 Imaging   LV EF: 60% -   65%   09/12/2016 Tumor Marker   Patient's tumor was tested for the following markers: CA125 Results of the tumor marker test revealed 98.5   09/12/2016 - 01/29/2018 Chemotherapy   The patient received Doxil and Avastin. Avastin was discontinued in Dec 2019 due to GI hemorrhage   09/26/2016 Tumor Marker   Patient's tumor was tested for the following markers: CA125 Results of the tumor marker test revealed 68.9   10/10/2016 Tumor Marker   Patient's tumor was tested for the following markers: CA125 Results of the tumor marker test revealed 65.6   11/07/2016 Tumor Marker   Patient's tumor was tested for the following markers: CA125 Results of the tumor marker test revealed 46.4   11/29/2016 Imaging   Stable mild peritoneal thickening, suspicious for peritoneal carcinomatosis. No new or progressive disease identified. No evidence of ascites.  No significant change in 10 mm left external iliac and 8 mm mesenteric lymph nodes.  Stable hepatic cirrhosis, and splenomegaly consistent with portal venous hypertension.  Colonic diverticulosis, without radiographic evidence of diverticulitis.  12/02/2016 Imaging   Normal LV size with EF 55%. Basal inferior and basal inferoseptal hypokinesis. Normal RV size and systolic function. No significant valvular abnormalities.   12/05/2016 Tumor Marker   Patient's tumor was tested for the following markers: CA125 Results of the tumor marker test revealed 49.1   01/09/2017 Tumor Marker   Patient's tumor was tested for the following markers: CA125 Results of the tumor marker test revealed 47.2   02/06/2017 Tumor Marker   Patient's tumor was tested for the following markers: CA125 Results of the tumor marker test revealed 44.1   02/18/2017 Imaging   Stable mild  peritoneal thickening. No new or progressive disease identified within the abdomen or pelvis.  Stable tiny sub-cm left external iliac and mesenteric lymph nodes.  Stable hepatic cirrhosis, and splenomegaly consistent with portal venous hypertension. No evidence of hepatic neoplasm.  Colonic diverticulosis, without radiographic evidence of diverticulitis.   03/06/2017 Tumor Marker   Patient's tumor was tested for the following markers: CA125 Results of the tumor marker test revealed 50.4   03/17/2017 Imaging   - Left ventricle: The cavity size was normal. Wall thickness was normal. Systolic function was normal. The estimated ejection fraction was in the range of 55% to 60%. Wall motion was normal; there were no regional wall motion abnormalities. Features are consistent with a pseudonormal left ventricular filling pattern, with concomitant abnormal relaxation and increased filling pressure (grade 2 diastolic dysfunction). - Mitral valve: There was mild regurgitation. - Left atrium: The atrium was mildly dilated   04/03/2017 Tumor Marker   Patient's tumor was tested for the following markers: CA125 Results of the tumor marker test revealed 47.2   05/14/2017 Imaging   CT scan of abdomen and pelvis Stable mild peritoneal thickening. No new or progressive disease within the abdomen or pelvis.  Stable hepatic cirrhosis. Stable splenomegaly, consistent with portal venous hypertension. No evidence of hepatic neoplasm.   05/15/2017 Tumor Marker   Patient's tumor was tested for the following markers: CA125 Results of the tumor marker test revealed 56.1   06/12/2017 Tumor Marker   Patient's tumor was tested for the following markers: CA125 Results of the tumor marker test revealed 49.2   07/10/2017 Tumor Marker   Patient's tumor was tested for the following markers: CA125 Results of the tumor marker test revealed 46.3   08/07/2017 Tumor Marker   Patient's tumor was tested for the following markers:  CA125 Results of the tumor marker test revealed 52.9   08/20/2017 Imaging   1. Mild omental/peritoneal haziness, unchanged. No evidence of new metastatic disease. 2. Cirrhosis with splenomegaly.   09/04/2017 Tumor Marker   Patient's tumor was tested for the following markers: CA125 Results of the tumor marker test revealed 48.5   11/13/2017 Tumor Marker   Patient's tumor was tested for the following markers: CA125 Results of the tumor marker test revealed 55.8   12/17/2017 Imaging   1. No findings to suggest metastatic disease in the abdomen or pelvis. 2. Severe hepatic cirrhosis with evidence of portal hypertension, as demonstrated by dilated portal vein, splenomegaly and portosystemic collateral pathways, including gastric and esophageal varices.  3. Colonic diverticulosis without evidence of acute diverticulitis at this time. 4. Additional incidental findings, as above.   01/16/2018 Tumor Marker   Patient's tumor was tested for the following markers: CA125 Results of the tumor marker test revealed 63.7   02/10/2018 Procedure   EGD - Recently bleeding grade III and large (> 5 mm) esophageal varices. Completely eradicated. Banded. -  Portal hypertensive gastropathy. - Normal examined duodenum. - No specimens collected   02/13/2018 Tumor Marker   Patient's tumor was tested for the following markers: CA125 Results of the tumor marker test revealed 82.7   02/25/2018 Imaging   Bone density showed mild osteopenia   04/08/2018 Tumor Marker   Patient's tumor was tested for the following markers: CA125 Results of the tumor marker test revealed 82.7   04/08/2018 Imaging   1. No definite omental or peritoneal surface lesions. However, there are 2 slowly enlarging lymph nodes noted. One is in the small bowel mesentery and the other is in the left deep pelvis. Could not exclude recurrent disease. 2. Stable advanced cirrhotic changes involving the liver with portal venous hypertension, portal  venous collaterals, esophageal varices and splenomegaly. No worrisome hepatic lesions. 3. Diffuse wall thickening of the colon could suggest diffuse colitis or could be due to low albumin. Recommend correlation with any symptoms such as diarrhea.   05/21/2018 Tumor Marker   Patient's tumor was tested for the following markers: CA125 Results of the tumor marker test revealed 86   09/07/2018 Tumor Marker   Patient's tumor was tested for the following markers:CA-125 Results of the tumor marker test revealed 111   09/29/2018 Tumor Marker   Patient's tumor was tested for the following markers: CA-125 Results of the tumor marker test revealed 121.   09/29/2018 - 11/24/2018 Chemotherapy   The patient had carboplatin and gemzar for chemotherapy treatment.  Chemo was stopped due to allergic reaction to carboplatin   10/27/2018 Tumor Marker   Patient's tumor was tested for the following markers: CA-125 Results of the tumor marker test revealed 99.9   11/24/2018 Tumor Marker   Patient's tumor was tested for the following markers: CA-125 Results of the tumor marker test revealed 81.2   12/14/2018 Tumor Marker   Patient's tumor was tested for the following markers: CA-125 Results of the tumor marker test revealed 81.2   12/14/2018 Imaging   1. Mild improvement in peritoneal disease. Peritoneal nodule within the left posterior pelvis contiguous with the left side of vaginal cuff is slightly decreased in size in the interval. Within the right iliac fossa there is a small peritoneal nodule which has decreased in size in the interval. Central small bowel mesenteric lymph node is not significantly changed. Slight decrease in size left periaortic lymph node.    2. Morphologic features of the liver compatible with cirrhosis. Splenomegaly is noted which may reflect portal venous hypertension.   12/21/2018 Tumor Marker   Patient's tumor was tested for the following markers: CA-125 Results of the tumor marker  test revealed 64.2   12/21/2018 -  Chemotherapy   The patient had taxotere for chemotherapy treatment.     03/02/2019 Tumor Marker   Patient's tumor was tested for the following markers: CA-125. 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   No new or progressive metastatic disease within the abdomen or pelvis.   Hepatic cirrhosis and findings of portal venous hypertension. 1.1 cm low-attenuation lesion within left hepatic lobe, which could represent a regenerative nodule or small hepatocellular carcinoma. Recommend abdomen MRI without and with contrast for further characterization.     04/13/2019 Tumor Marker   Patient's tumor was tested for the following markers: CA-125 Results of the tumor marker test revealed 36.8   05/26/2019 Tumor Marker   Patient's tumor was tested for the following markers:  CA-125 Results of the tumor marker test revealed 31.6.     REVIEW OF SYSTEMS:   Constitutional: Denies fevers, chills or abnormal weight loss Eyes: Denies blurriness of vision Ears, nose, mouth, throat, and face: Denies mucositis or sore throat Respiratory: Denies cough, dyspnea or wheezes Cardiovascular: Denies palpitation, chest discomfort or lower extremity swelling Gastrointestinal:  Denies nausea, heartburn or change in bowel habits Skin: Denies abnormal skin rashes Lymphatics: Denies new lymphadenopathy or easy bruising Behavioral/Psych: Mood is stable, no new changes  All other systems were reviewed with the patient and are negative.  I have reviewed the past medical history, past surgical history, social history and family history with the patient and they are unchanged from previous note.  ALLERGIES:  is allergic to carboplatin; shellfish allergy; vancomycin; benadryl [diphenhydramine hcl]; and penicillins.  MEDICATIONS:  Current Outpatient Medications  Medication Sig Dispense  Refill  . cholecalciferol (VITAMIN D) 1000 units tablet Take 1,000 Units by mouth at bedtime.     Marland Kitchen dexamethasone (DECADRON) 4 MG tablet Start the day before Taxotere, take 1 tablet in the morning, and then 1 tablet daily after chemo for 2 days. Pls dispense 30 tabs (Patient taking differently: Take 4 mg by mouth See admin instructions. Start the day before Taxotere, take 1 tablet in the morning, and then 1 tablet daily after chemo for 2 days. Pls dispense 30 tabs) 30 tablet 1  . gabapentin (NEURONTIN) 300 MG capsule Take 3 capsules (900 mg total) by mouth 2 (two) times daily. 120 capsule 11  . HUMALOG MIX 75/25 KWIKPEN (75-25) 100 UNIT/ML Kwikpen Inject 10 Units into the skin 2 (two) times daily. Before breakfast & before supper Except when taking Chemo take 15 units  For five days    . HYDROcodone-acetaminophen (NORCO/VICODIN) 5-325 MG tablet Take 1 tablet by mouth every 6 (six) hours as needed for moderate pain. 60 tablet 0  . lidocaine-prilocaine (EMLA) cream Apply to Porta-cath  1-2 hours prior to access as directed. (Patient taking differently: Apply 1 application topically daily as needed (port access). ) 30 g 9  . nadolol (CORGARD) 20 MG tablet Take 1 tablet (20 mg total) by mouth daily. (Patient taking differently: Take 20 mg by mouth at bedtime. ) 90 tablet 3  . ondansetron (ZOFRAN) 8 MG tablet Take 1 tablet (8 mg total) by mouth every 8 (eight) hours as needed. Start on the third day after chemotherapy. (Patient taking differently: Take 8 mg by mouth every 8 (eight) hours as needed for nausea. Start on the third day after chemotherapy.) 30 tablet 1  . pantoprazole (PROTONIX) 40 MG tablet Take 1 tablet (40 mg total) by mouth daily.    Marland Kitchen PROAIR HFA 108 (90 Base) MCG/ACT inhaler Inhale 2 puffs into the lungs every 4 (four) hours as needed for wheezing or shortness of breath.     . prochlorperazine (COMPAZINE) 10 MG tablet Take 1 tablet (10 mg total) by mouth every 6 (six) hours as needed (Nausea  or vomiting). 30 tablet 1  . rotigotine (NEUPRO) 4 MG/24HR Place 1 patch onto the skin daily. 30 patch 5   No current facility-administered medications for this visit.   Facility-Administered Medications Ordered in Other Visits  Medication Dose Route Frequency Provider Last Rate Last Admin  . sodium chloride flush (NS) 0.9 % injection 10 mL  10 mL Intracatheter PRN Alvy Bimler, Briel Gallicchio, MD   10 mL at 06/15/19 1240    PHYSICAL EXAMINATION: ECOG PERFORMANCE STATUS: 1 - Symptomatic but completely ambulatory  Vitals:   06/15/19 1012  BP: 131/69  Pulse: 68  Resp: 18  Temp: 98.3 F (36.8 C)  SpO2: 100%   Filed Weights   06/15/19 1012  Weight: 194 lb 11.2 oz (88.3 kg)    GENERAL:alert, no distress and comfortable SKIN: skin color, texture, turgor are normal, no rashes or significant lesions EYES: normal, Conjunctiva are pink and non-injected, sclera clear OROPHARYNX:no exudate, no erythema and lips, buccal mucosa, and tongue normal  NECK: supple, thyroid normal size, non-tender, without nodularity LYMPH:  no palpable lymphadenopathy in the cervical, axillary or inguinal LUNGS: clear to auscultation and percussion with normal breathing effort HEART: regular rate & rhythm and no murmurs and no lower extremity edema ABDOMEN:abdomen soft, non-tender and normal bowel sounds Musculoskeletal:no cyanosis of digits and no clubbing  NEURO: alert & oriented x 3 with fluent speech, no focal motor/sensory deficits  LABORATORY DATA:  I have reviewed the data as listed    Component Value Date/Time   NA 143 06/15/2019 0947   NA 142 02/06/2017 0742   K 3.8 06/15/2019 0947   K 3.9 02/06/2017 0742   CL 111 06/15/2019 0947   CO2 24 06/15/2019 0947   CO2 22 02/06/2017 0742   GLUCOSE 234 (H) 06/15/2019 0947   GLUCOSE 156 (H) 02/06/2017 0742   BUN 16 06/15/2019 0947   BUN 13.5 02/06/2017 0742   CREATININE 0.88 06/15/2019 0947   CREATININE 0.8 02/06/2017 0742   CALCIUM 8.1 (L) 06/15/2019 0947    CALCIUM 8.5 02/06/2017 0742   PROT 6.6 06/15/2019 0947   PROT 6.9 02/06/2017 0742   ALBUMIN 3.7 06/15/2019 0947   ALBUMIN 3.7 02/06/2017 0742   AST 18 06/15/2019 0947   AST 47 (H) 02/06/2017 0742   ALT 27 06/15/2019 0947   ALT 54 02/06/2017 0742   ALKPHOS 134 (H) 06/15/2019 0947   ALKPHOS 130 02/06/2017 0742   BILITOT 0.6 06/15/2019 0947   BILITOT 0.65 02/06/2017 0742   GFRNONAA >60 06/15/2019 0947   GFRAA >60 06/15/2019 0947    No results found for: SPEP, UPEP  Lab Results  Component Value Date   WBC 6.5 06/15/2019   NEUTROABS 5.2 06/15/2019   HGB 11.0 (L) 06/15/2019   HCT 34.0 (L) 06/15/2019   MCV 98.6 06/15/2019   PLT 86 (L) 06/15/2019      Chemistry      Component Value Date/Time   NA 143 06/15/2019 0947   NA 142 02/06/2017 0742   K 3.8 06/15/2019 0947   K 3.9 02/06/2017 0742   CL 111 06/15/2019 0947   CO2 24 06/15/2019 0947   CO2 22 02/06/2017 0742   BUN 16 06/15/2019 0947   BUN 13.5 02/06/2017 0742   CREATININE 0.88 06/15/2019 0947   CREATININE 0.8 02/06/2017 0742      Component Value Date/Time   CALCIUM 8.1 (L) 06/15/2019 0947   CALCIUM 8.5 02/06/2017 0742   ALKPHOS 134 (H) 06/15/2019 0947   ALKPHOS 130 02/06/2017 0742   AST 18 06/15/2019 0947   AST 47 (H) 02/06/2017 0742   ALT 27 06/15/2019 0947   ALT 54 02/06/2017 0742   BILITOT 0.6 06/15/2019 0947   BILITOT 0.65 02/06/2017 0742

## 2019-06-15 NOTE — Assessment & Plan Note (Signed)
Her last imaging study showed positive response to treatment Her tumor marker which is now improved to within normal limits Clinically, she had positive response to treatment despite significant dose adjustment and aggressive supportive care I plan to repeat imaging study at the end of the month for further follow-up

## 2019-06-15 NOTE — Assessment & Plan Note (Signed)
Her blood sugar is elevated She will continue further management with primary care doctor/endocrinologist

## 2019-06-15 NOTE — Assessment & Plan Note (Signed)
She has multifactorial pancytopenia, due to recent chemotherapy, liver disease with splenomegaly as well as history of iron deficiency Her recent EGD showed no evidence of active bleeding She denies recent bleeding complications We will proceed with treatment without dose adjustment

## 2019-06-15 NOTE — Patient Instructions (Signed)
[facility] Discharge Instructions for Patients Receiving Chemotherapy  Today you received the following chemotherapy agents Taxotere  To help prevent nausea and vomiting after your treatment, we encourage you to take your nausea medication as directed   If you develop nausea and vomiting that is not controlled by your nausea medication, call the clinic.   BELOW ARE SYMPTOMS THAT SHOULD BE REPORTED IMMEDIATELY:  *FEVER GREATER THAN 100.5 F  *CHILLS WITH OR WITHOUT FEVER  NAUSEA AND VOMITING THAT IS NOT CONTROLLED WITH YOUR NAUSEA MEDICATION  *UNUSUAL SHORTNESS OF BREATH  *UNUSUAL BRUISING OR BLEEDING  TENDERNESS IN MOUTH AND THROAT WITH OR WITHOUT PRESENCE OF ULCERS  *URINARY PROBLEMS  *BOWEL PROBLEMS  UNUSUAL RASH Items with * indicate a potential emergency and should be followed up as soon as possible.  Feel free to call the clinic should you have any questions or concerns. The clinic phone number is [phone].  Please show the [facility] at check-in to the Emergency Department and triage nurse.

## 2019-06-16 LAB — CA 125: Cancer Antigen (CA) 125: 25.4 U/mL (ref 0.0–38.1)

## 2019-06-30 NOTE — Progress Notes (Signed)
Pharmacist Chemotherapy Monitoring - Follow Up Assessment    I verify that I have reviewed each item in the below checklist:  . Regimen for the patient is scheduled for the appropriate day and plan matches scheduled date. Marland Kitchen Appropriate non-routine labs are ordered dependent on drug ordered. . If applicable, additional medications reviewed and ordered per protocol based on lifetime cumulative doses and/or treatment regimen.   Plan for follow-up and/or issues identified: No . I-vent associated with next due treatment: No . MD and/or nursing notified: No  Olivia Benson 06/30/2019 10:48 AM

## 2019-07-05 ENCOUNTER — Ambulatory Visit (HOSPITAL_COMMUNITY)
Admission: RE | Admit: 2019-07-05 | Discharge: 2019-07-05 | Disposition: A | Discharge status: Home / Self Care | Payer: Medicare Other | Source: Ambulatory Visit | Attending: Hematology and Oncology | Admitting: Hematology and Oncology

## 2019-07-05 ENCOUNTER — Other Ambulatory Visit: Payer: Self-pay

## 2019-07-05 DIAGNOSIS — C482 Malignant neoplasm of peritoneum, unspecified: Secondary | ICD-10-CM | POA: Insufficient documentation

## 2019-07-05 MED ORDER — IOHEXOL 300 MG/ML  SOLN
100.0000 mL | Freq: Once | INTRAMUSCULAR | Status: AC | PRN
  Administered 2019-07-05: 100 mL via INTRAVENOUS

## 2019-07-05 MED ORDER — HEPARIN SOD (PORK) LOCK FLUSH 100 UNIT/ML IV SOLN
500.0000 [IU] | Freq: Once | INTRAVENOUS | Status: AC
  Administered 2019-07-05: 500 [IU] via INTRAVENOUS

## 2019-07-05 MED ORDER — SODIUM CHLORIDE (PF) 0.9 % IJ SOLN
INTRAMUSCULAR | Status: AC
  Filled 2019-07-05: qty 50

## 2019-07-05 MED ORDER — IOHEXOL 9 MG/ML PO SOLN
500.0000 mL | ORAL | Status: AC
  Administered 2019-07-05: 500 mL via ORAL

## 2019-07-05 MED ORDER — IOHEXOL 9 MG/ML PO SOLN
ORAL | Status: AC
  Administered 2019-07-05: 500 mL via ORAL
  Filled 2019-07-05: qty 1000

## 2019-07-05 MED ORDER — HEPARIN SOD (PORK) LOCK FLUSH 100 UNIT/ML IV SOLN
INTRAVENOUS | Status: AC
  Filled 2019-07-05: qty 5

## 2019-07-06 ENCOUNTER — Inpatient Hospital Stay: Payer: Medicare Other

## 2019-07-06 ENCOUNTER — Inpatient Hospital Stay (HOSPITAL_BASED_OUTPATIENT_CLINIC_OR_DEPARTMENT_OTHER): Payer: Medicare Other | Admitting: Hematology and Oncology

## 2019-07-06 ENCOUNTER — Encounter: Payer: Self-pay | Admitting: Hematology and Oncology

## 2019-07-06 ENCOUNTER — Other Ambulatory Visit: Payer: Self-pay

## 2019-07-06 DIAGNOSIS — G620 Drug-induced polyneuropathy: Secondary | ICD-10-CM | POA: Diagnosis not present

## 2019-07-06 DIAGNOSIS — Z794 Long term (current) use of insulin: Secondary | ICD-10-CM

## 2019-07-06 DIAGNOSIS — C482 Malignant neoplasm of peritoneum, unspecified: Secondary | ICD-10-CM

## 2019-07-06 DIAGNOSIS — E1140 Type 2 diabetes mellitus with diabetic neuropathy, unspecified: Secondary | ICD-10-CM

## 2019-07-06 DIAGNOSIS — D61818 Other pancytopenia: Secondary | ICD-10-CM | POA: Diagnosis not present

## 2019-07-06 DIAGNOSIS — D509 Iron deficiency anemia, unspecified: Secondary | ICD-10-CM

## 2019-07-06 DIAGNOSIS — Z7189 Other specified counseling: Secondary | ICD-10-CM

## 2019-07-06 DIAGNOSIS — Z5111 Encounter for antineoplastic chemotherapy: Secondary | ICD-10-CM | POA: Diagnosis not present

## 2019-07-06 DIAGNOSIS — T451X5A Adverse effect of antineoplastic and immunosuppressive drugs, initial encounter: Secondary | ICD-10-CM

## 2019-07-06 DIAGNOSIS — C569 Malignant neoplasm of unspecified ovary: Secondary | ICD-10-CM

## 2019-07-06 LAB — CBC WITH DIFFERENTIAL (CANCER CENTER ONLY)
Abs Immature Granulocytes: 0.03 10*3/uL (ref 0.00–0.07)
Basophils Absolute: 0 10*3/uL (ref 0.0–0.1)
Basophils Relative: 0 %
Eosinophils Absolute: 0 10*3/uL (ref 0.0–0.5)
Eosinophils Relative: 0 %
HCT: 33.5 % — ABNORMAL LOW (ref 36.0–46.0)
Hemoglobin: 10.9 g/dL — ABNORMAL LOW (ref 12.0–15.0)
Immature Granulocytes: 1 %
Lymphocytes Relative: 14 %
Lymphs Abs: 0.9 10*3/uL (ref 0.7–4.0)
MCH: 31.9 pg (ref 26.0–34.0)
MCHC: 32.5 g/dL (ref 30.0–36.0)
MCV: 98 fL (ref 80.0–100.0)
Monocytes Absolute: 0.4 10*3/uL (ref 0.1–1.0)
Monocytes Relative: 7 %
Neutro Abs: 5 10*3/uL (ref 1.7–7.7)
Neutrophils Relative %: 78 %
Platelet Count: 95 10*3/uL — ABNORMAL LOW (ref 150–400)
RBC: 3.42 MIL/uL — ABNORMAL LOW (ref 3.87–5.11)
RDW: 16.5 % — ABNORMAL HIGH (ref 11.5–15.5)
WBC Count: 6.4 10*3/uL (ref 4.0–10.5)
nRBC: 0 % (ref 0.0–0.2)

## 2019-07-06 LAB — CMP (CANCER CENTER ONLY)
ALT: 26 U/L (ref 0–44)
AST: 22 U/L (ref 15–41)
Albumin: 3.7 g/dL (ref 3.5–5.0)
Alkaline Phosphatase: 132 U/L — ABNORMAL HIGH (ref 38–126)
Anion gap: 8 (ref 5–15)
BUN: 18 mg/dL (ref 8–23)
CO2: 24 mmol/L (ref 22–32)
Calcium: 8.6 mg/dL — ABNORMAL LOW (ref 8.9–10.3)
Chloride: 109 mmol/L (ref 98–111)
Creatinine: 0.88 mg/dL (ref 0.44–1.00)
GFR, Est AFR Am: >60 mL/min (ref >60)
GFR, Estimated: >60 mL/min (ref >60)
Glucose, Bld: 215 mg/dL — ABNORMAL HIGH (ref 70–99)
Potassium: 3.7 mmol/L (ref 3.5–5.1)
Sodium: 141 mmol/L (ref 135–145)
Total Bilirubin: 0.7 mg/dL (ref 0.3–1.2)
Total Protein: 6.9 g/dL (ref 6.5–8.1)

## 2019-07-06 MED ORDER — SODIUM CHLORIDE 0.9% FLUSH
10.0000 mL | Freq: Once | INTRAVENOUS | Status: AC
  Administered 2019-07-06: 10 mL
  Filled 2019-07-06: qty 10

## 2019-07-06 MED ORDER — SODIUM CHLORIDE 0.9% FLUSH
10.0000 mL | INTRAVENOUS | Status: DC | PRN
  Administered 2019-07-06: 10 mL
  Filled 2019-07-06: qty 10

## 2019-07-06 MED ORDER — DEXAMETHASONE SODIUM PHOSPHATE 10 MG/ML IJ SOLN
INTRAMUSCULAR | Status: AC
  Filled 2019-07-06: qty 1

## 2019-07-06 MED ORDER — SODIUM CHLORIDE 0.9 % IV SOLN
37.5000 mg/m2 | Freq: Once | INTRAVENOUS | Status: AC
  Administered 2019-07-06: 70 mg via INTRAVENOUS
  Filled 2019-07-06: qty 7

## 2019-07-06 MED ORDER — HYDROCODONE-ACETAMINOPHEN 5-325 MG PO TABS: 1.0000 | ORAL_TABLET | Freq: Four times a day (QID) | ORAL | 0 refills | Status: DC | PRN

## 2019-07-06 MED ORDER — PEGFILGRASTIM 6 MG/0.6ML ~~LOC~~ PSKT
PREFILLED_SYRINGE | SUBCUTANEOUS | Status: AC
  Filled 2019-07-06: qty 0.6

## 2019-07-06 MED ORDER — HEPARIN SOD (PORK) LOCK FLUSH 100 UNIT/ML IV SOLN
500.0000 [IU] | Freq: Once | INTRAVENOUS | Status: AC | PRN
  Administered 2019-07-06: 500 [IU]
  Filled 2019-07-06: qty 5

## 2019-07-06 MED ORDER — SODIUM CHLORIDE 0.9 % IV SOLN
Freq: Once | INTRAVENOUS | Status: AC
  Filled 2019-07-06: qty 250

## 2019-07-06 MED ORDER — DEXAMETHASONE SODIUM PHOSPHATE 10 MG/ML IJ SOLN
5.0000 mg | Freq: Once | INTRAMUSCULAR | Status: AC
  Administered 2019-07-06: 5 mg via INTRAVENOUS

## 2019-07-06 MED ORDER — PEGFILGRASTIM 6 MG/0.6ML ~~LOC~~ PSKT
6.0000 mg | PREFILLED_SYRINGE | Freq: Once | SUBCUTANEOUS | Status: AC
  Administered 2019-07-06: 6 mg via SUBCUTANEOUS

## 2019-07-06 NOTE — Progress Notes (Signed)
[place] OFFICE PROGRESS NOTE  Patient Care Team: Gregor Hams, FNP as PCP - General (Family Medicine) Tat, Eustace Quail, DO as Consulting Physician (Neurology)  ASSESSMENT & PLAN:  Primary peritoneal carcinomatosis [facility]) Her tumor marker is improving CT scan shows stable disease She tolerated chemotherapy fairly well except for mild pancytopenia and neuropathy We will proceed with treatment without delay We will continue treatment for few more months, her next imaging study will be due around August or September  Pancytopenia, acquired [facility]) She has multifactorial pancytopenia, due to recent chemotherapy, liver disease with splenomegaly as well as history of iron deficiency Her recent EGD showed no evidence of active bleeding She denies recent bleeding complications We will proceed with treatment without dose adjustment  Diabetes mellitus with diabetic neuropathy, with long-term current use of insulin ([place]) Her blood sugar is elevated She will continue further management with primary care doctor/endocrinologist  Chemotherapy-induced neuropathy ([place]) She felt that her neuropathy is stable. For now, she will continue gabapentin and pain medicine as prescribed  Iron deficiency anemia She has intermittent iron deficiency anemia due to chronic GI bleed I plan to recheck iron studies in her next visit   Orders Placed This Encounter  Procedures  . Iron and TIBC    Standing Status:   Future    Standing Expiration Date:   07/05/2020  . Ferritin    Standing Status:   Future    Standing Expiration Date:   07/05/2020    All questions were answered. The patient knows to call the clinic with any problems, questions or concerns. The total time spent in the appointment was 30 minutes encounter with patients including review of chart and various tests results, discussions about plan of care and coordination of care plan   [name], MD 07/06/2019 11:53 AM  INTERVAL  HISTORY: Please see below for problem oriented charting. She returns for treatment and follow-up Her peripheral neuropathy is a bit worse but not severe She continues to take pain medicine as needed Her blood sugar control is fair No recent reaction to treatment The patient denies any recent signs or symptoms of bleeding such as spontaneous epistaxis, hematuria or hematochezia.   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 Had allergic reaction to carboplatin. Avastin was stopped due to GI hemorrhage   Primary peritoneal carcinomatosis ([place])  09/06/2011 Imaging   CT findings consistent with cirrhotic changes involving the liver.  No worrisome liver mass.  There are associated portal venous collaterals and splenomegaly consistent with portal venous hypertension. 2.  No other significant upper abdominal findings   11/08/2013 Imaging   US showed ascites   11/08/2013 Initial Diagnosis   Patient had ~ 2 months of some urinary incontinence, saw Dr Ronita Hipps in 10-2013, with pelvic mass on exam    11/12/2013 Imaging   There are findings of progressive hepatic cirrhosis with a large amount of ascites and mild splenomegaly. There are venous collaterals present. 2. There is abnormal thickening of the peritoneal surface along the left mid and lower abdominal wall. There is abnormal soft tissue density in the pelvis as well which could reflect the clinically suspected ovarian malignancy but the findings are nondiagnostic. 3. There is a new small left pleural effusion. 4. There is no acute bowel abnormality.   11/18/2013 Imaging   Successful ultrasound guided diagnostic and therapeutic paracentesis yielding 4.1 liters of ascites.    11/18/2013 Pathology  Results   PERITONEAL/ASCITIC FLUID (SPECIMEN 1 OF 1 COLLECTED 11/18/13): SEROUS CARCINOMA, PLEASE SEE COMMENT.   12/01/2013 Tumor Marker   Patient's  tumor was tested for the following markers: CA125 Results of the tumor marker test revealed 1938   12/03/2013 Imaging   Successful ultrasound-guided therapeutic paracentesis yielding 3.6 liters of peritoneal fluid.   12/07/2013 - 05/30/2014 Chemotherapy   She received 3 cycles of carboplatin and Taxol, interrupted cycle 4 for surgery.  She subsequently completed 3 more cycles of chemotherapy after surgery   12/20/2013 Imaging   Successful ultrasound-guided diagnostic and therapeutic paracentesis yielding 2.2 liters of peritoneal fluid   12/20/2013 Pathology Results   PERITONEAL/ASCITIC FLUID(SPECIMEN 1 OF 1 COLLECTED 12/20/13): MALIGNANT CELLS CONSISTENT WITH SEROUS CARCINOMA   01/13/2014 Tumor Marker   Patient's tumor was tested for the following markers: CA125 Results of the tumor marker test revealed 227   02/07/2014 Tumor Marker   Patient's tumor was tested for the following markers: CA125 Results of the tumor marker test revealed 130   02/15/2014 Genetic Testing   Genetics testing from 02-2014 normal (Ova[employer] panel)   02/23/2014 Imaging   Interval decrease in ascites. 2. Morphologic changes in the liver consistent with cirrhosis. 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 Portal vein remains patent at this time. 3. Persistent but decreased abnormal soft tissue attenuation tracking in the omental fat. This may be secondary to interval improvement in metastatic disease. 4. Peritoneal thickening seen in the left abdomen and pelvis on the previous study has decreased and nearly resolved in the interval. This also suggests interval improvement in metastatic disease.    03/07/2014 Procedure   Ultrasound and fluoroscopically guided right internal jugular single lumen power port catheter insertion. Tip in the SVC/RA junction. Catheter ready for use.   03/17/2014 Tumor Marker   Patient's tumor was tested for the following markers: CA125 Results of the tumor  marker test revealed 45   03/29/2014 Pathology Results   1. Omentum, resection for tumor - HIGH GRADE SEROUS CARCINOMA, SEE COMMENT. 2. Ovary and fallopian tube, right - HIGH GRADE SEROUS CARCINOMA, SEE COMMENT. 3. Ovary and fallopian tube, left - HIGH GRADE SEROUS CARCINOMA, SEE COMMENT. Diagnosis Note 1. Nests and clusters of malignant cells are invading the omental tissue (part #1) with associated fibrosis. The cells are pleomorphic with prominent nucleoli. There are scattered psammoma bodies. The ovaries are atrophic and exhibit multiple foci of surface based invasive carcinoma and associated fibrosis. The fallopian tubes have a few foci of carcinoma, also superficially located. There are no precursor lesions noted in the ovaries or fallopian tubes (the entire tubes and ovaries were submitted for evaluation). While there is some retraction artifact, there are several foci suspicious for lymphovascular invasion. 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 Given the fibrosis, there does appear to be a small amount of treatment effect, however, there is abundant residual tumor.    03/29/2014 Surgery   Procedure(s) Performed: 1. Exploratory laparotomy with bilateral salpingo-oophorectomy, omentectomy radical tumor debulking for ovarian cancer .  Surgeon: Thereasa Solo, MD.  Assistant Surgeon: Lahoma Crocker, M.D. Assistant: (an MD assistant was necessary for tissue manipulation, retraction and positioning due to the complexity of the case and hospital policies).  Operative Findings: 10cm omental cake from hepatic to splenic flexure, densely adherent to transverse colon. Milliary studding of tumor implants (<42m, too numerous in  number to count) adherent to the mesentery of the small bowel and small bowel wall. Small volume (100cc) ascites. Small ovaries  bilaterally, densely adherent to the pelvic cul de sac. Left ovary and tube densely adherent to sigmoid colon. Cirrhotic liver with hepatomegaly and splenomegaly.     This represented an optimal cytoreduction (R1) with visible disease residual on the bowel wall and mesentery (millial, <27m implants).     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 2. 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    08/18/2014 Tumor Marker   Patient's tumor was tested for the following markers: CA125 Results of the tumor marker test revealed 16   10/13/2014 Tumor Marker   Patient's tumor was tested for the following markers: CA125 Results of the tumor marker test revealed 14   11/21/2014 Tumor Marker   Patient's tumor was tested for the following markers: CA125 Results of the tumor marker test revealed 16   12/28/2014 Tumor Marker   Patient's tumor was tested for the following markers: CA125 Results of the tumor marker test revealed 762   02/27/2015 Tumor Marker   Patient's tumor was tested for the following markers: CA125 Results of the tumor marker test revealed 20.2   03/22/2015 Imaging   Filler appearance to the prior exam, with very subtle fluid tracking along portions of the liver, and very minimal nodularity along the right paracolic gutter representing residua from the prior peritoneal cancer. The current abnormalities could simply be post therapy findings rather than necessarily representing residual malignancy. No  new or enlarging lesions are identified. 2. Hepatic cirrhosis and splenomegaly. There is some gastric varices suggesting portal venous hypertension. 3. Left foraminal stenosis at L4-5 due to spurring. There is likely also some central narrowing of the thecal sac at this level. 4. Bibasilar scarring. 5. Chronic mild left mid kidney scarring   03/27/2015 Tumor Marker   Patient's tumor was tested for the following markers: CA125 Results of the tumor marker test revealed 25.3   04/24/2015 Imaging   Disc bulge L2-3 with mild spinal stenosis and right foraminal encroachment. 2. Disc bulge L3-4 with mild spinal stenosis. 3. Multifactorial spinal, left lateral recess and foraminal stenosis L4-5. There is grade 1 anterolisthesis without evident dynamic instability.   05/29/2015 Tumor Marker   Patient's tumor was tested for the following markers: CA125 Results of the tumor marker test revealed 29.9   08/21/2015 Tumor Marker   Patient's tumor was tested for the following markers: CA125 Results of the tumor marker test revealed 45   09/18/2015 Tumor Marker   Patient's tumor was tested for the following markers: CA125 Results of the tumor marker test revealed 47   09/27/2015 Imaging   New small bowel mesenteric nodule and enlarged left external iliac lymph node, highly worrisome for metastatic disease. 2. Cirrhosis and splenomegaly   10/10/2015 Tumor Marker   Patient's tumor was tested for the following markers: CA125 Results of the tumor marker test revealed 53.4   10/10/2015 - 12/11/2015 Chemotherapy   She  received 4 cycles of carboplatin only   11/20/2015 Tumor Marker   Patient's tumor was tested for the following markers: CA125 Results of the tumor marker test revealed 41.3   12/20/2015 Imaging   CT: Mild left external iliac lymphadenopathy is slightly decreased. Mildly enlarged lower mesenteric nodule is slightly decreased. 2. No new or progressive metastatic disease in the abdomen or pelvis. 3.  Cirrhosis. Stable subcentimeter hypodense left liver lobe lesion. No new liver lesions. 4. Stable mild splenomegaly.  No ascites. 5. Mild sigmoid diverticulosis.   01/01/2016 -  Anti-estrogen oral therapy   She was placed on tamoxifen. Had letrozole initially but switched to tamoxifen due to poor tolerance   01/15/2016 Tumor Marker   Patient's tumor was tested for the following markers: CA125 Results of the tumor marker test revealed 31.4   02/19/2016 Tumor Marker   Patient's tumor was tested for the following markers: CA125 Results of the tumor marker test revealed 36.5   05/13/2016 Imaging   No evidence of disease progression within the abdomen or pelvis. Previously noted small central mesenteric nodule and left external iliac node appear slightly smaller. 2. Stable changes of hepatic cirrhosis and portal hypertension with associated splenomegaly. No new or enlarging hepatic lesions are identified. 3. No acute findings.   05/13/2016 Tumor Marker   Patient's tumor was tested for the following markers: CA125 Results of the tumor marker test revealed 41.2   06/24/2016 Tumor Marker   Patient's tumor was tested for the following markers: CA125 Results of the tumor marker test revealed 47.3   08/20/2016 Imaging   1. The left external iliac lymph node is slightly increased in size compared to the prior exam, currently 1.1 cm and previously 0.9 cm in diameter. 2. Stable central mesenteric lymph node at 0.9 cm in diameter. 3. Stable trace free pelvic fluid and stable slight thickening along the right paracolic gutter, without well-defined peritoneal nodularity. 4. Other imaging findings of potential clinical significance: Subsegmental atelectasis or scarring in the lung bases. Hepatic cirrhosis. Left renal scarring. Mild splenomegaly. Sigmoid colon diverticulosis. Impingement at L4-5 due to spondylosis and degenerative disc disease broad Schmorl' s nodes at L3-L4. Pelvic floor laxity   08/23/2016 Tumor  Marker   Patient's tumor was tested for the following markers: CA125 Results of the tumor marker test revealed 80.2   09/04/2016 Imaging   LV EF: 60% -   65%   09/12/2016 Tumor Marker   Patient's tumor was tested for the following markers: CA125 Results of the tumor marker test revealed 98.5   09/12/2016 - 01/29/2018 Chemotherapy   The patient received Doxil and Avastin. Avastin was discontinued in Dec 2019 due to GI hemorrhage   09/26/2016 Tumor Marker   Patient's tumor was tested for the following markers: CA125 Results of the tumor marker test revealed 68.9   10/10/2016 Tumor Marker   Patient's tumor was tested for the following markers: CA125 Results of the tumor marker test revealed 65.6   11/07/2016 Tumor Marker   Patient's tumor was tested for the following markers: CA125 Results of the tumor marker test revealed 46.4   11/29/2016 Imaging   Stable mild peritoneal thickening, suspicious for peritoneal carcinomatosis. No new or progressive disease identified. No evidence of ascites.  No significant change in 10 mm left external iliac and 8 mm mesenteric lymph nodes.  Stable hepatic cirrhosis, and splenomegaly consistent with portal venous hypertension.  Colonic diverticulosis, without radiographic evidence of diverticulitis.   12/02/2016 Imaging   Normal LV size with  EF 55%. Basal inferior and basal inferoseptal hypokinesis. Normal RV size and systolic function. No significant valvular abnormalities.   12/05/2016 Tumor Marker   Patient's tumor was tested for the following markers: CA125 Results of the tumor marker test revealed 49.1   01/09/2017 Tumor Marker   Patient's tumor was tested for the following markers: CA125 Results of the tumor marker test revealed 47.2   02/06/2017 Tumor Marker   Patient's tumor was tested for the following markers: CA125 Results of the tumor marker test revealed 44.1   02/18/2017 Imaging   Stable mild peritoneal thickening. No new or  progressive disease identified within the abdomen or pelvis.  Stable tiny sub-cm left external iliac and mesenteric lymph nodes.  Stable hepatic cirrhosis, and splenomegaly consistent with portal venous hypertension. No evidence of hepatic neoplasm.  Colonic diverticulosis, without radiographic evidence of diverticulitis.   03/06/2017 Tumor Marker   Patient's tumor was tested for the following markers: CA125 Results of the tumor marker test revealed 50.4   03/17/2017 Imaging   - Left ventricle: The cavity size was normal. Wall thickness was normal. Systolic function was normal. The estimated ejection fraction was in the range of 55% to 60%. Wall motion was normal; there were no regional wall motion abnormalities. Features are consistent with a pseudonormal left ventricular filling pattern, with concomitant abnormal relaxation and increased filling pressure (grade 2 diastolic dysfunction). - Mitral valve: There was mild regurgitation. - Left atrium: The atrium was mildly dilated   04/03/2017 Tumor Marker   Patient's tumor was tested for the following markers: CA125 Results of the tumor marker test revealed 47.2   05/14/2017 Imaging   CT scan of abdomen and pelvis Stable mild peritoneal thickening. No new or progressive disease within the abdomen or pelvis.  Stable hepatic cirrhosis. Stable splenomegaly, consistent with portal venous hypertension. No evidence of hepatic neoplasm.   05/15/2017 Tumor Marker   Patient's tumor was tested for the following markers: CA125 Results of the tumor marker test revealed 56.1   06/12/2017 Tumor Marker   Patient's tumor was tested for the following markers: CA125 Results of the tumor marker test revealed 49.2   07/10/2017 Tumor Marker   Patient's tumor was tested for the following markers: CA125 Results of the tumor marker test revealed 46.3   08/07/2017 Tumor Marker   Patient's tumor was tested for the following markers: CA125 Results of the tumor  marker test revealed 52.9   08/20/2017 Imaging   1. Mild omental/peritoneal haziness, unchanged. No evidence of new metastatic disease. 2. Cirrhosis with splenomegaly.   09/04/2017 Tumor Marker   Patient's tumor was tested for the following markers: CA125 Results of the tumor marker test revealed 48.5   11/13/2017 Tumor Marker   Patient's tumor was tested for the following markers: CA125 Results of the tumor marker test revealed 55.8   12/17/2017 Imaging   1. No findings to suggest metastatic disease in the abdomen or pelvis. 2. Severe hepatic cirrhosis with evidence of portal hypertension, as demonstrated by dilated portal vein, splenomegaly and portosystemic collateral pathways, including gastric and esophageal varices.  3. Colonic diverticulosis without evidence of acute diverticulitis at this time. 4. Additional incidental findings, as above.   01/16/2018 Tumor Marker   Patient's tumor was tested for the following markers: CA125 Results of the tumor marker test revealed 63.7   02/10/2018 Procedure   EGD - Recently bleeding grade III and large (> 5 mm) esophageal varices. Completely eradicated. Banded. - Portal hypertensive gastropathy. - Normal examined duodenum. -  No specimens collected   02/13/2018 Tumor Marker   Patient's tumor was tested for the following markers: CA125 Results of the tumor marker test revealed 82.7   02/25/2018 Imaging   Bone density showed mild osteopenia   04/08/2018 Tumor Marker   Patient's tumor was tested for the following markers: CA125 Results of the tumor marker test revealed 82.7   04/08/2018 Imaging   1. No definite omental or peritoneal surface lesions. However, there are 2 slowly enlarging lymph nodes noted. One is in the small bowel mesentery and the other is in the left deep pelvis. Could not exclude recurrent disease. 2. Stable advanced cirrhotic changes involving the liver with portal venous hypertension, portal venous collaterals,  esophageal varices and splenomegaly. No worrisome hepatic lesions. 3. Diffuse wall thickening of the colon could suggest diffuse colitis or could be due to low albumin. Recommend correlation with any symptoms such as diarrhea.   05/21/2018 Tumor Marker   Patient's tumor was tested for the following markers: CA125 Results of the tumor marker test revealed 86   09/07/2018 Tumor Marker   Patient's tumor was tested for the following markers:CA-125 Results of the tumor marker test revealed 111   09/29/2018 Tumor Marker   Patient's tumor was tested for the following markers: CA-125 Results of the tumor marker test revealed 121.   09/29/2018 - 11/24/2018 Chemotherapy   The patient had carboplatin and gemzar for chemotherapy treatment.  Chemo was stopped due to allergic reaction to carboplatin   10/27/2018 Tumor Marker   Patient's tumor was tested for the following markers: CA-125 Results of the tumor marker test revealed 99.9   11/24/2018 Tumor Marker   Patient's tumor was tested for the following markers: CA-125 Results of the tumor marker test revealed 81.2   12/14/2018 Tumor Marker   Patient's tumor was tested for the following markers: CA-125 Results of the tumor marker test revealed 81.2   12/14/2018 Imaging   1. Mild improvement in peritoneal disease. Peritoneal nodule within the left posterior pelvis contiguous with the left side of vaginal cuff is slightly decreased in size in the interval. Within the right iliac fossa there is a small peritoneal nodule which has decreased in size in the interval. Central small bowel mesenteric lymph node is not significantly changed. Slight decrease in size left periaortic lymph node.    2. Morphologic features of the liver compatible with cirrhosis. Splenomegaly is noted which may reflect portal venous hypertension.   12/21/2018 Tumor Marker   Patient's tumor was tested for the following markers: CA-125 Results of the tumor marker test revealed  64.2   12/21/2018 -  Chemotherapy   The patient had taxotere for chemotherapy treatment.     03/02/2019 Tumor Marker   Patient's tumor was tested for the following markers: CA-125. 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   No new or progressive metastatic disease within the abdomen or pelvis.   Hepatic cirrhosis and findings of portal venous hypertension. 1.1 cm low-attenuation lesion within left hepatic lobe, which could represent a regenerative nodule or small hepatocellular carcinoma. Recommend abdomen MRI without and with contrast for further characterization.     04/13/2019 Tumor Marker   Patient's tumor was tested for the following markers: CA-125 Results of the tumor marker test revealed 36.8   05/26/2019 Tumor Marker   Patient's tumor was tested for the following markers: CA-125 Results of the tumor marker test revealed  31.6.   06/15/2019 Tumor Marker   Patient's tumor was tested for the following markers: CA-125 Results of the tumor marker test revealed 25.4   07/05/2019 Imaging   1. Stable CT examination. Stable peritoneal thickening in the pelvis and paracolic gutters. Stable solid left deep pelvic implant. Stable trace ascites. No new or progressive metastatic disease. 2. Stable cirrhosis. No discrete liver masses. 3. Stable mild-to-moderate splenomegaly. 4. Aortic Atherosclerosis (ICD10-I70.0).     REVIEW OF SYSTEMS:   Constitutional: Denies fevers, chills or abnormal weight loss Eyes: Denies blurriness of vision Ears, nose, mouth, throat, and face: Denies mucositis or sore throat Respiratory: Denies cough, dyspnea or wheezes Cardiovascular: Denies palpitation, chest discomfort or lower extremity swelling Gastrointestinal:  Denies nausea, heartburn or change in bowel habits Skin: Denies abnormal skin rashes Lymphatics: Denies new lymphadenopathy or easy  bruising Behavioral/Psych: Mood is stable, no new changes  All other systems were reviewed with the patient and are negative.  I have reviewed the past medical history, past surgical history, social history and family history with the patient and they are unchanged from previous note.  ALLERGIES:  is allergic to carboplatin; shellfish allergy; vancomycin; benadryl [diphenhydramine hcl]; and penicillins.  MEDICATIONS:  Current Outpatient Medications  Medication Sig Dispense Refill  . cholecalciferol (VITAMIN D) 1000 units tablet Take 1,000 Units by mouth at bedtime.     Marland Kitchen dexamethasone (DECADRON) 4 MG tablet Start the day before Taxotere, take 1 tablet in the morning, and then 1 tablet daily after chemo for 2 days. Pls dispense 30 tabs (Patient taking differently: Take 4 mg by mouth See admin instructions. Start the day before Taxotere, take 1 tablet in the morning, and then 1 tablet daily after chemo for 2 days. Pls dispense 30 tabs) 30 tablet 1  . gabapentin (NEURONTIN) 300 MG capsule Take 3 capsules (900 mg total) by mouth 2 (two) times daily. 120 capsule 11  . HUMALOG MIX 75/25 KWIKPEN (75-25) 100 UNIT/ML Kwikpen Inject 10 Units into the skin 2 (two) times daily. Before breakfast & before supper Except when taking Chemo take 15 units  For five days    . HYDROcodone-acetaminophen (NORCO/VICODIN) 5-325 MG tablet Take 1 tablet by mouth every 6 (six) hours as needed for moderate pain. 60 tablet 0  . lidocaine-prilocaine (EMLA) cream Apply to Porta-cath  1-2 hours prior to access as directed. (Patient taking differently: Apply 1 application topically daily as needed (port access). ) 30 g 9  . nadolol (CORGARD) 20 MG tablet Take 1 tablet (20 mg total) by mouth daily. (Patient taking differently: Take 20 mg by mouth at bedtime. ) 90 tablet 3  . ondansetron (ZOFRAN) 8 MG tablet Take 1 tablet (8 mg total) by mouth every 8 (eight) hours as needed. Start on the third day after chemotherapy. (Patient  taking differently: Take 8 mg by mouth every 8 (eight) hours as needed for nausea. Start on the third day after chemotherapy.) 30 tablet 1  . pantoprazole (PROTONIX) 40 MG tablet Take 1 tablet (40 mg total) by mouth daily.    Marland Kitchen PROAIR HFA 108 (90 Base) MCG/ACT inhaler Inhale 2 puffs into the lungs every 4 (four) hours as needed for wheezing or shortness of breath.     . prochlorperazine (COMPAZINE) 10 MG tablet Take 1 tablet (10 mg total) by mouth every 6 (six) hours as needed (Nausea or vomiting). 30 tablet 1  . rotigotine (NEUPRO) 4 MG/24HR Place 1 patch onto the skin daily. 30 patch 5   No  current facility-administered medications for this visit.   Facility-Administered Medications Ordered in Other Visits  Medication Dose Route Frequency Provider Last Rate Last Admin  . DOCEtaxel (TAXOTERE) 70 mg in sodium chloride 0.9 % 150 mL chemo infusion  37.5 mg/m2 (Treatment Plan Recorded) Intravenous Once Alvy Bimler, Vonnie Ligman, MD      . heparin lock flush 100 unit/mL  500 Units Intracatheter Once PRN Alvy Bimler, Virgen Belland, MD      . pegfilgrastim (NEULASTA ONPRO KIT) injection 6 mg  6 mg Subcutaneous Once Alvy Bimler, Eshawn Coor, MD      . sodium chloride flush (NS) 0.9 % injection 10 mL  10 mL Intracatheter PRN Alvy Bimler, Ramona Slinger, MD        PHYSICAL EXAMINATION: ECOG PERFORMANCE STATUS: 1 - Symptomatic but completely ambulatory  Vitals:   07/06/19 1044  BP: 137/70  Pulse: 68  Resp: 18  Temp: 97.8 F (36.6 C)  SpO2: 100%   Filed Weights   07/06/19 1044  Weight: 197 lb (89.4 kg)    GENERAL:alert, no distress and comfortable NEURO: alert & oriented x 3 with fluent speech, no focal motor/sensory deficits  LABORATORY DATA:  I have reviewed the data as listed    Component Value Date/Time   NA 141 07/06/2019 1022   NA 142 02/06/2017 0742   K 3.7 07/06/2019 1022   K 3.9 02/06/2017 0742   CL 109 07/06/2019 1022   CO2 24 07/06/2019 1022   CO2 22 02/06/2017 0742   GLUCOSE 215 (H) 07/06/2019 1022   GLUCOSE 156 (H)  02/06/2017 0742   BUN 18 07/06/2019 1022   BUN 13.5 02/06/2017 0742   CREATININE 0.88 07/06/2019 1022   CREATININE 0.8 02/06/2017 0742   CALCIUM 8.6 (L) 07/06/2019 1022   CALCIUM 8.5 02/06/2017 0742   PROT 6.9 07/06/2019 1022   PROT 6.9 02/06/2017 0742   ALBUMIN 3.7 07/06/2019 1022   ALBUMIN 3.7 02/06/2017 0742   AST 22 07/06/2019 1022   AST 47 (H) 02/06/2017 0742   ALT 26 07/06/2019 1022   ALT 54 02/06/2017 0742   ALKPHOS 132 (H) 07/06/2019 1022   ALKPHOS 130 02/06/2017 0742   BILITOT 0.7 07/06/2019 1022   BILITOT 0.65 02/06/2017 0742   GFRNONAA >60 07/06/2019 1022   GFRAA >60 07/06/2019 1022    No results found for: SPEP, UPEP  Lab Results  Component Value Date   WBC 6.4 07/06/2019   NEUTROABS 5.0 07/06/2019   HGB 10.9 (L) 07/06/2019   HCT 33.5 (L) 07/06/2019   MCV 98.0 07/06/2019   PLT 95 (L) 07/06/2019      Chemistry      Component Value Date/Time   NA 141 07/06/2019 1022   NA 142 02/06/2017 0742   K 3.7 07/06/2019 1022   K 3.9 02/06/2017 0742   CL 109 07/06/2019 1022   CO2 24 07/06/2019 1022   CO2 22 02/06/2017 0742   BUN 18 07/06/2019 1022   BUN 13.5 02/06/2017 0742   CREATININE 0.88 07/06/2019 1022   CREATININE 0.8 02/06/2017 0742      Component Value Date/Time   CALCIUM 8.6 (L) 07/06/2019 1022   CALCIUM 8.5 02/06/2017 0742   ALKPHOS 132 (H) 07/06/2019 1022   ALKPHOS 130 02/06/2017 0742   AST 22 07/06/2019 1022   AST 47 (H) 02/06/2017 0742   ALT 26 07/06/2019 1022   ALT 54 02/06/2017 0742   BILITOT 0.7 07/06/2019 1022   BILITOT 0.65 02/06/2017 0742       RADIOGRAPHIC STUDIES: I have reviewed multiple  imaging studies with the patient I have personally reviewed the radiological images as listed and agreed with the findings in the report. CT ABDOMEN PELVIS W CONTRAST  Result Date: 07/05/2019 CLINICAL DATA:  Ovarian cancer with ongoing chemotherapy. Restaging. EXAM: CT ABDOMEN AND PELVIS WITH CONTRAST TECHNIQUE: Multidetector CT imaging of the  abdomen and pelvis was performed using the standard protocol following bolus administration of intravenous contrast. CONTRAST:  11m OMNIPAQUE IOHEXOL 300 MG/ML  SOLN COMPARISON:  03/22/2019 CT abdomen/pelvis. FINDINGS: Lower chest: Stable parenchymal bands at both lung bases. No acute abnormality at the lung bases. Hepatobiliary: Diffusely irregular liver surface compatible with cirrhosis, unchanged. No discrete liver masses. Stable tiny 3 mm polyp versus stone in the dependent gallbladder, which is otherwise normal. No biliary ductal dilatation. Pancreas: Normal, with no mass or duct dilation. Spleen: Stable mild-to-moderate splenomegaly. Craniocaudal splenic length 15.6 cm. No splenic mass. Adrenals/Urinary Tract: Normal adrenals. Stable asymmetric left renal atrophy. No hydronephrosis. No renal masses. Normal bladder. Stomach/Bowel: Normal non-distended stomach. Normal caliber small bowel with no small bowel wall thickening. Oral contrast transits to right colon. Appendix not discretely visualized. Mild sigmoid diverticulosis with no large bowel wall thickening or acute pericolonic fat stranding. Vascular/Lymphatic: Atherosclerotic nonaneurysmal abdominal aorta. Patent portal, splenic, hepatic and renal veins. Retroaortic left renal vein. No pathologically enlarged lymph nodes in the abdomen or pelvis. Reproductive: Status post hysterectomy. No adnexal masses. Stable thickening of the bilateral deep pelvic peritoneum. Solid left deep pelvic 1.6 x 1.2 cm implant (series 3/image 74), previously 1.6 x 1.4 cm, not substantially changed. Other: No focal fluid collection. No free air. Trace perihepatic and pelvic ascites, unchanged. Stable peritoneal thickening in the paracolic gutters bilaterally. Stable tiny fat containing umbilical hernia. No new focal peritoneal implants. Musculoskeletal: No aggressive appearing focal osseous lesions. Marked lumbar spondylosis with chronic superior L3 vertebral endplate  deformity. IMPRESSION: 1. Stable CT examination. Stable peritoneal thickening in the pelvis and paracolic gutters. Stable solid left deep pelvic implant. Stable trace ascites. No new or progressive metastatic disease. 2. Stable cirrhosis. No discrete liver masses. 3. Stable mild-to-moderate splenomegaly. 4. Aortic Atherosclerosis (ICD10-I70.0). Electronically Signed   By: JIlona SorrelM.D.   On: 07/05/2019 13:55

## 2019-07-06 NOTE — Assessment & Plan Note (Signed)
She felt that her neuropathy is stable. For now, she will continue gabapentin and pain medicine as prescribed

## 2019-07-06 NOTE — Assessment & Plan Note (Signed)
Her blood sugar is elevated She will continue further management with primary care doctor/endocrinologist

## 2019-07-06 NOTE — Assessment & Plan Note (Signed)
She has multifactorial pancytopenia, due to recent chemotherapy, liver disease with splenomegaly as well as history of iron deficiency Her recent EGD showed no evidence of active bleeding She denies recent bleeding complications We will proceed with treatment without dose adjustment

## 2019-07-06 NOTE — Assessment & Plan Note (Signed)
She has intermittent iron deficiency anemia due to chronic GI bleed I plan to recheck iron studies in her next visit

## 2019-07-06 NOTE — Patient Instructions (Signed)
[facility] Discharge Instructions for Patients Receiving Chemotherapy  Today you received the following chemotherapy agents: docetaxel.  To help prevent nausea and vomiting after your treatment, we encourage you to take your nausea medication as directed.   If you develop nausea and vomiting that is not controlled by your nausea medication, call the clinic.   BELOW ARE SYMPTOMS THAT SHOULD BE REPORTED IMMEDIATELY:  *FEVER GREATER THAN 100.5 F  *CHILLS WITH OR WITHOUT FEVER  NAUSEA AND VOMITING THAT IS NOT CONTROLLED WITH YOUR NAUSEA MEDICATION  *UNUSUAL SHORTNESS OF BREATH  *UNUSUAL BRUISING OR BLEEDING  TENDERNESS IN MOUTH AND THROAT WITH OR WITHOUT PRESENCE OF ULCERS  *URINARY PROBLEMS  *BOWEL PROBLEMS  UNUSUAL RASH Items with * indicate a potential emergency and should be followed up as soon as possible.  Feel free to call the clinic should you have any questions or concerns. The clinic phone number is [phone].  Please show the [facility] at check-in to the Emergency Department and triage nurse.

## 2019-07-06 NOTE — Assessment & Plan Note (Signed)
Her tumor marker is improving CT scan shows stable disease She tolerated chemotherapy fairly well except for mild pancytopenia and neuropathy We will proceed with treatment without delay We will continue treatment for few more months, her next imaging study will be due around August or September

## 2019-07-07 ENCOUNTER — Telehealth: Payer: Self-pay | Admitting: Hematology and Oncology

## 2019-07-07 NOTE — Telephone Encounter (Signed)
Scheduled per 5/25 sch message. Pt aware of appts.

## 2019-07-27 ENCOUNTER — Inpatient Hospital Stay: Payer: Medicare Other

## 2019-07-27 ENCOUNTER — Other Ambulatory Visit: Payer: Self-pay

## 2019-07-27 ENCOUNTER — Encounter: Payer: Self-pay | Admitting: Hematology and Oncology

## 2019-07-27 ENCOUNTER — Inpatient Hospital Stay: Payer: Medicare Other | Attending: Hematology and Oncology | Admitting: Hematology and Oncology

## 2019-07-27 DIAGNOSIS — D61818 Other pancytopenia: Secondary | ICD-10-CM | POA: Diagnosis not present

## 2019-07-27 DIAGNOSIS — Z5111 Encounter for antineoplastic chemotherapy: Secondary | ICD-10-CM | POA: Insufficient documentation

## 2019-07-27 DIAGNOSIS — T451X5A Adverse effect of antineoplastic and immunosuppressive drugs, initial encounter: Secondary | ICD-10-CM | POA: Diagnosis not present

## 2019-07-27 DIAGNOSIS — C569 Malignant neoplasm of unspecified ovary: Secondary | ICD-10-CM

## 2019-07-27 DIAGNOSIS — G629 Polyneuropathy, unspecified: Secondary | ICD-10-CM | POA: Insufficient documentation

## 2019-07-27 DIAGNOSIS — D509 Iron deficiency anemia, unspecified: Secondary | ICD-10-CM

## 2019-07-27 DIAGNOSIS — K7460 Unspecified cirrhosis of liver: Secondary | ICD-10-CM | POA: Insufficient documentation

## 2019-07-27 DIAGNOSIS — C482 Malignant neoplasm of peritoneum, unspecified: Secondary | ICD-10-CM | POA: Insufficient documentation

## 2019-07-27 DIAGNOSIS — Z7189 Other specified counseling: Secondary | ICD-10-CM

## 2019-07-27 LAB — CMP (CANCER CENTER ONLY)
ALT: 31 U/L (ref 0–44)
AST: 22 U/L (ref 15–41)
Albumin: 3.9 g/dL (ref 3.5–5.0)
Alkaline Phosphatase: 127 U/L — ABNORMAL HIGH (ref 38–126)
Anion gap: 12 (ref 5–15)
BUN: 19 mg/dL (ref 8–23)
CO2: 23 mmol/L (ref 22–32)
Calcium: 8.6 mg/dL — ABNORMAL LOW (ref 8.9–10.3)
Chloride: 110 mmol/L (ref 98–111)
Creatinine: 0.82 mg/dL (ref 0.44–1.00)
GFR, Est AFR Am: >60 mL/min (ref >60)
GFR, Estimated: >60 mL/min (ref >60)
Glucose, Bld: 137 mg/dL — ABNORMAL HIGH (ref 70–99)
Potassium: 3.8 mmol/L (ref 3.5–5.1)
Sodium: 145 mmol/L (ref 135–145)
Total Bilirubin: 0.7 mg/dL (ref 0.3–1.2)
Total Protein: 7 g/dL (ref 6.5–8.1)

## 2019-07-27 LAB — CBC WITH DIFFERENTIAL (CANCER CENTER ONLY)
Abs Immature Granulocytes: 0.06 10*3/uL (ref 0.00–0.07)
Basophils Absolute: 0 10*3/uL (ref 0.0–0.1)
Basophils Relative: 0 %
Eosinophils Absolute: 0 10*3/uL (ref 0.0–0.5)
Eosinophils Relative: 0 %
HCT: 32.7 % — ABNORMAL LOW (ref 36.0–46.0)
Hemoglobin: 10.5 g/dL — ABNORMAL LOW (ref 12.0–15.0)
Immature Granulocytes: 1 %
Lymphocytes Relative: 13 %
Lymphs Abs: 1.1 10*3/uL (ref 0.7–4.0)
MCH: 31.4 pg (ref 26.0–34.0)
MCHC: 32.1 g/dL (ref 30.0–36.0)
MCV: 97.9 fL (ref 80.0–100.0)
Monocytes Absolute: 0.5 10*3/uL (ref 0.1–1.0)
Monocytes Relative: 6 %
Neutro Abs: 6.6 10*3/uL (ref 1.7–7.7)
Neutrophils Relative %: 80 %
Platelet Count: 97 10*3/uL — ABNORMAL LOW (ref 150–400)
RBC: 3.34 MIL/uL — ABNORMAL LOW (ref 3.87–5.11)
RDW: 16.9 % — ABNORMAL HIGH (ref 11.5–15.5)
WBC Count: 8.3 10*3/uL (ref 4.0–10.5)
nRBC: 0 % (ref 0.0–0.2)

## 2019-07-27 LAB — FERRITIN: Ferritin: 82 ng/mL (ref 11–307)

## 2019-07-27 LAB — IRON AND TIBC
Iron: 64 ug/dL (ref 28–170)
Saturation Ratios: 19 % (ref 10.4–31.8)
TIBC: 344 ug/dL (ref 250–450)
UIBC: 280 ug/dL

## 2019-07-27 MED ORDER — DEXAMETHASONE SODIUM PHOSPHATE 10 MG/ML IJ SOLN
5.0000 mg | Freq: Once | INTRAMUSCULAR | Status: AC
  Administered 2019-07-27: 5 mg via INTRAVENOUS

## 2019-07-27 MED ORDER — PEGFILGRASTIM 6 MG/0.6ML ~~LOC~~ PSKT
PREFILLED_SYRINGE | SUBCUTANEOUS | Status: AC
  Filled 2019-07-27: qty 0.6

## 2019-07-27 MED ORDER — SODIUM CHLORIDE 0.9% FLUSH
10.0000 mL | INTRAVENOUS | Status: DC | PRN
  Administered 2019-07-27: 10 mL
  Filled 2019-07-27: qty 10

## 2019-07-27 MED ORDER — SODIUM CHLORIDE 0.9% FLUSH
10.0000 mL | Freq: Once | INTRAVENOUS | Status: AC
  Administered 2019-07-27: 10 mL
  Filled 2019-07-27: qty 10

## 2019-07-27 MED ORDER — SODIUM CHLORIDE 0.9 % IV SOLN
37.5000 mg/m2 | Freq: Once | INTRAVENOUS | Status: AC
  Administered 2019-07-27: 70 mg via INTRAVENOUS
  Filled 2019-07-27: qty 7

## 2019-07-27 MED ORDER — SODIUM CHLORIDE 0.9 % IV SOLN
Freq: Once | INTRAVENOUS | Status: AC
  Filled 2019-07-27: qty 250

## 2019-07-27 MED ORDER — DEXAMETHASONE SODIUM PHOSPHATE 10 MG/ML IJ SOLN
INTRAMUSCULAR | Status: AC
  Filled 2019-07-27: qty 1

## 2019-07-27 MED ORDER — HEPARIN SOD (PORK) LOCK FLUSH 100 UNIT/ML IV SOLN
500.0000 [IU] | Freq: Once | INTRAVENOUS | Status: AC | PRN
  Administered 2019-07-27: 500 [IU]
  Filled 2019-07-27: qty 5

## 2019-07-27 MED ORDER — PEGFILGRASTIM 6 MG/0.6ML ~~LOC~~ PSKT
6.0000 mg | PREFILLED_SYRINGE | Freq: Once | SUBCUTANEOUS | Status: AC
  Administered 2019-07-27: 6 mg via SUBCUTANEOUS

## 2019-07-27 NOTE — Progress Notes (Signed)
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[facility]) Her tumor marker is improving CT scan shows stable disease She tolerated chemotherapy fairly well except for mild pancytopenia and neuropathy We will proceed with treatment without delay We will continue treatment for few more months, her next imaging study will be due around August or September  Iron deficiency anemia She has intermittent iron deficiency anemia due to chronic GI bleed Iron studies are pending I will schedule intravenous iron infusion if needed If she continues to have frequent melena, she will need repeat EGD   Pancytopenia, acquired ([place]) She has multifactorial pancytopenia, due to recent chemotherapy, liver disease with splenomegaly as well as history of iron deficiency She will likely need intravenous iron infusion She does not need treatment for thrombocytopenia   No orders of the defined types were placed in this encounter.   All questions were answered. The patient knows to call the clinic with any problems, questions or concerns. The total time spent in the appointment was 25 minutes encounter with patients including review of chart and various tests results, discussions about plan of care and coordination of care plan   [name], MD 07/27/2019 2:35 PM  INTERVAL HISTORY: Please see below for problem oriented charting. 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 Had allergic reaction to carboplatin. Avastin was stopped due to GI hemorrhage   Primary  peritoneal carcinomatosis ([place])  09/06/2011 Imaging   CT findings consistent with cirrhotic changes involving the liver.  No worrisome liver mass.  There are associated portal venous collaterals and splenomegaly consistent with portal venous hypertension. 2.  No other significant upper abdominal findings   11/08/2013 Imaging   US showed ascites   11/08/2013 Initial Diagnosis   Patient had ~ 2 months of some urinary incontinence, saw Dr Ronita Hipps in 10-2013, with pelvic mass on exam    11/12/2013 Imaging   There are findings of progressive hepatic cirrhosis with a large amount of ascites and mild splenomegaly. There are venous collaterals present. 2. There is abnormal thickening of the peritoneal surface along the left mid and lower abdominal wall. There is abnormal soft tissue density in the pelvis as well which could reflect the clinically suspected ovarian malignancy but the findings are nondiagnostic. 3. There is a new small left pleural effusion. 4. There is no acute bowel abnormality.   11/18/2013 Imaging   Successful ultrasound guided diagnostic and therapeutic paracentesis yielding 4.1 liters of ascites.    11/18/2013 Pathology Results   PERITONEAL/ASCITIC FLUID (SPECIMEN 1 OF 1 COLLECTED 11/18/13): SEROUS CARCINOMA, PLEASE SEE COMMENT.   12/01/2013 Tumor Marker   Patient's tumor was tested for the following markers: CA125 Results of the tumor marker test revealed 1938   12/03/2013 Imaging   Successful ultrasound-guided therapeutic paracentesis yielding 3.6 liters of peritoneal fluid.   12/07/2013 - 05/30/2014 Chemotherapy   She received 3 cycles of carboplatin and Taxol, interrupted cycle 4 for surgery.  She subsequently completed 3 more cycles of chemotherapy after surgery   12/20/2013 Imaging   Successful ultrasound-guided diagnostic and therapeutic paracentesis yielding 2.2 liters of peritoneal fluid   12/20/2013 Pathology  Results   PERITONEAL/ASCITIC FLUID(SPECIMEN 1 OF 1 COLLECTED  12/20/13): MALIGNANT CELLS CONSISTENT WITH SEROUS CARCINOMA   01/13/2014 Tumor Marker   Patient's tumor was tested for the following markers: CA125 Results of the tumor marker test revealed 227   02/07/2014 Tumor Marker   Patient's tumor was tested for the following markers: CA125 Results of the tumor marker test revealed 130   02/15/2014 Genetic Testing   Genetics testing from 02-2014 normal ([employer] panel)   02/23/2014 Imaging   Interval decrease in ascites. 2. Morphologic changes in the liver consistent with cirrhosis. 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 Portal vein remains patent at this time. 3. Persistent but decreased abnormal soft tissue attenuation tracking in the omental fat. This may be secondary to interval improvement in metastatic disease. 4. Peritoneal thickening seen in the left abdomen and pelvis on the previous study has decreased and nearly resolved in the interval. This also suggests interval improvement in metastatic disease.    03/07/2014 Procedure   Ultrasound and fluoroscopically guided right internal jugular single lumen power port catheter insertion. Tip in the SVC/RA junction. Catheter ready for use.   03/17/2014 Tumor Marker   Patient's tumor was tested for the following markers: CA125 Results of the tumor marker test revealed 45   03/29/2014 Pathology Results   1. Omentum, resection for tumor - HIGH GRADE SEROUS CARCINOMA, SEE COMMENT. 2. Ovary and fallopian tube, right - HIGH GRADE SEROUS CARCINOMA, SEE COMMENT. 3. Ovary and fallopian tube, left - HIGH GRADE SEROUS CARCINOMA, SEE COMMENT. Diagnosis Note 1. Nests and clusters of malignant cells are invading the omental tissue (part #1) with associated fibrosis. The cells are pleomorphic with prominent nucleoli. There are scattered psammoma bodies. The ovaries are atrophic and exhibit multiple foci of surface based invasive carcinoma and associated fibrosis. The fallopian  tubes have a few foci of carcinoma, also superficially located. There are no precursor lesions noted in the ovaries or fallopian tubes (the entire tubes and ovaries were submitted for evaluation). While there is some retraction artifact, there are several foci suspicious for lymphovascular invasion. 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 Given the fibrosis, there does appear to be a small amount of treatment effect, however, there is abundant residual tumor.    03/29/2014 Surgery   Procedure(s) Performed: 1. Exploratory laparotomy with bilateral salpingo-oophorectomy, omentectomy radical tumor debulking for ovarian cancer .  Surgeon: Thereasa Solo, MD.  Assistant Surgeon: Lahoma Crocker, M.D. Assistant: (an MD assistant was necessary for tissue manipulation, retraction and positioning due to the complexity of the case and hospital policies).  Operative Findings: 10cm omental cake from hepatic to splenic flexure, densely adherent to transverse colon. Milliary studding of tumor implants (<11m, too numerous in number to count) adherent to the mesentery of the small bowel and small bowel wall. Small volume (100cc) ascites. Small ovaries bilaterally, densely adherent to the pelvic cul de sac. Left ovary and tube densely adherent to sigmoid colon. Cirrhotic liver with hepatomegaly and splenomegaly.     This represented an optimal cytoreduction (R1) with visible disease residual on the bowel wall and mesentery (millial, <321mimplants).     04/18/2014 Tumor Marker   Patient's tumor was tested for the following markers: CA125 Results of the tumor marker test revealed 122   05/16/2014 Tumor Marker   Patient's tumor was tested for the following markers: CA125  Results of the tumor marker test revealed 30   06/10/2014 Tumor Marker   Patient's tumor was tested for the  following markers: CA125 Results of the tumor marker test revealed 19   07/04/2014 Imaging   Interval improvement in the appearance of peritoneal metastasis secondary to ovarian cancer. 2. 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    08/18/2014 Tumor Marker   Patient's tumor was tested for the following markers: CA125 Results of the tumor marker test revealed 16   10/13/2014 Tumor Marker   Patient's tumor was tested for the following markers: CA125 Results of the tumor marker test revealed 14   11/21/2014 Tumor Marker   Patient's tumor was tested for the following markers: CA125 Results of the tumor marker test revealed 16   12/28/2014 Tumor Marker   Patient's tumor was tested for the following markers: CA125 Results of the tumor marker test revealed 762   02/27/2015 Tumor Marker   Patient's tumor was tested for the following markers: CA125 Results of the tumor marker test revealed 20.2   03/22/2015 Imaging   Filler appearance to the prior exam, with very subtle fluid tracking along portions of the liver, and very minimal nodularity along the right paracolic gutter representing residua from the prior peritoneal cancer. The current abnormalities could simply be post therapy findings rather than necessarily representing residual malignancy. No new or enlarging lesions are identified. 2. Hepatic cirrhosis and splenomegaly. There is some gastric varices suggesting portal venous hypertension. 3. Left foraminal stenosis at L4-5 due to spurring. There is likely also some central narrowing of the thecal sac at this level. 4. Bibasilar scarring. 5. Chronic mild left mid kidney scarring   03/27/2015 Tumor Marker   Patient's tumor was tested for the following markers: CA125 Results of the tumor marker test revealed 25.3   04/24/2015 Imaging   Disc bulge L2-3 with mild spinal stenosis and right foraminal encroachment. 2. Disc bulge L3-4 with mild spinal stenosis. 3. Multifactorial  spinal, left lateral recess and foraminal stenosis L4-5. There is grade 1 anterolisthesis without evident dynamic instability.   05/29/2015 Tumor Marker   Patient's tumor was tested for the following markers: CA125 Results of the tumor marker test revealed 29.9   08/21/2015 Tumor Marker   Patient's tumor was tested for the following markers: CA125 Results of the tumor marker test revealed 45   09/18/2015 Tumor Marker   Patient's tumor was tested for the following markers: CA125 Results of the tumor marker test revealed 47   09/27/2015 Imaging   New small bowel mesenteric nodule and enlarged left external iliac lymph node, highly worrisome for metastatic disease. 2. Cirrhosis and splenomegaly   10/10/2015 Tumor Marker   Patient's tumor was tested for the following markers: CA125 Results of the tumor marker test revealed 53.4   10/10/2015 - 12/11/2015 Chemotherapy   She received 4 cycles of carboplatin only   11/20/2015 Tumor Marker   Patient's tumor was tested for the following markers: CA125 Results of the tumor marker test revealed 41.3   12/20/2015 Imaging   CT: Mild left external iliac lymphadenopathy is slightly decreased. Mildly enlarged lower mesenteric nodule is slightly decreased. 2. No new or progressive metastatic disease in the abdomen or pelvis. 3. Cirrhosis. Stable subcentimeter hypodense left liver lobe lesion. No new liver lesions. 4. Stable mild splenomegaly.  No ascites. 5. Mild sigmoid diverticulosis.   01/01/2016 -  Anti-estrogen oral therapy   She was placed on tamoxifen. Had letrozole initially but switched to  tamoxifen due to poor tolerance   01/15/2016 Tumor Marker   Patient's tumor was tested for the following markers: CA125 Results of the tumor marker test revealed 31.4   02/19/2016 Tumor Marker   Patient's tumor was tested for the following markers: CA125 Results of the tumor marker test revealed 36.5   05/13/2016 Imaging   No evidence of disease progression  within the abdomen or pelvis. Previously noted small central mesenteric nodule and left external iliac node appear slightly smaller. 2. Stable changes of hepatic cirrhosis and portal hypertension with associated splenomegaly. No new or enlarging hepatic lesions are identified. 3. No acute findings.   05/13/2016 Tumor Marker   Patient's tumor was tested for the following markers: CA125 Results of the tumor marker test revealed 41.2   06/24/2016 Tumor Marker   Patient's tumor was tested for the following markers: CA125 Results of the tumor marker test revealed 47.3   08/20/2016 Imaging   1. The left external iliac lymph node is slightly increased in size compared to the prior exam, currently 1.1 cm and previously 0.9 cm in diameter. 2. Stable central mesenteric lymph node at 0.9 cm in diameter. 3. Stable trace free pelvic fluid and stable slight thickening along the right paracolic gutter, without well-defined peritoneal nodularity. 4. Other imaging findings of potential clinical significance: Subsegmental atelectasis or scarring in the lung bases. Hepatic cirrhosis. Left renal scarring. Mild splenomegaly. Sigmoid colon diverticulosis. Impingement at L4-5 due to spondylosis and degenerative disc disease broad Schmorl' s nodes at L3-L4. Pelvic floor laxity   08/23/2016 Tumor Marker   Patient's tumor was tested for the following markers: CA125 Results of the tumor marker test revealed 80.2   09/04/2016 Imaging   LV EF: 60% -   65%   09/12/2016 Tumor Marker   Patient's tumor was tested for the following markers: CA125 Results of the tumor marker test revealed 98.5   09/12/2016 - 01/29/2018 Chemotherapy   The patient received Doxil and Avastin. Avastin was discontinued in Dec 2019 due to GI hemorrhage   09/26/2016 Tumor Marker   Patient's tumor was tested for the following markers: CA125 Results of the tumor marker test revealed 68.9   10/10/2016 Tumor Marker   Patient's tumor was tested for the  following markers: CA125 Results of the tumor marker test revealed 65.6   11/07/2016 Tumor Marker   Patient's tumor was tested for the following markers: CA125 Results of the tumor marker test revealed 46.4   11/29/2016 Imaging   Stable mild peritoneal thickening, suspicious for peritoneal carcinomatosis. No new or progressive disease identified. No evidence of ascites.  No significant change in 10 mm left external iliac and 8 mm mesenteric lymph nodes.  Stable hepatic cirrhosis, and splenomegaly consistent with portal venous hypertension.  Colonic diverticulosis, without radiographic evidence of diverticulitis.   12/02/2016 Imaging   Normal LV size with EF 55%. Basal inferior and basal inferoseptal hypokinesis. Normal RV size and systolic function. No significant valvular abnormalities.   12/05/2016 Tumor Marker   Patient's tumor was tested for the following markers: CA125 Results of the tumor marker test revealed 49.1   01/09/2017 Tumor Marker   Patient's tumor was tested for the following markers: CA125 Results of the tumor marker test revealed 47.2   02/06/2017 Tumor Marker   Patient's tumor was tested for the following markers: CA125 Results of the tumor marker test revealed 44.1   02/18/2017 Imaging   Stable mild peritoneal thickening. No new or progressive disease identified within the abdomen or  pelvis.  Stable tiny sub-cm left external iliac and mesenteric lymph nodes.  Stable hepatic cirrhosis, and splenomegaly consistent with portal venous hypertension. No evidence of hepatic neoplasm.  Colonic diverticulosis, without radiographic evidence of diverticulitis.   03/06/2017 Tumor Marker   Patient's tumor was tested for the following markers: CA125 Results of the tumor marker test revealed 50.4   03/17/2017 Imaging   - Left ventricle: The cavity size was normal. Wall thickness was normal. Systolic function was normal. The estimated ejection fraction was in the range of  55% to 60%. Wall motion was normal; there were no regional wall motion abnormalities. Features are consistent with a pseudonormal left ventricular filling pattern, with concomitant abnormal relaxation and increased filling pressure (grade 2 diastolic dysfunction). - Mitral valve: There was mild regurgitation. - Left atrium: The atrium was mildly dilated   04/03/2017 Tumor Marker   Patient's tumor was tested for the following markers: CA125 Results of the tumor marker test revealed 47.2   05/14/2017 Imaging   CT scan of abdomen and pelvis Stable mild peritoneal thickening. No new or progressive disease within the abdomen or pelvis.  Stable hepatic cirrhosis. Stable splenomegaly, consistent with portal venous hypertension. No evidence of hepatic neoplasm.   05/15/2017 Tumor Marker   Patient's tumor was tested for the following markers: CA125 Results of the tumor marker test revealed 56.1   06/12/2017 Tumor Marker   Patient's tumor was tested for the following markers: CA125 Results of the tumor marker test revealed 49.2   07/10/2017 Tumor Marker   Patient's tumor was tested for the following markers: CA125 Results of the tumor marker test revealed 46.3   08/07/2017 Tumor Marker   Patient's tumor was tested for the following markers: CA125 Results of the tumor marker test revealed 52.9   08/20/2017 Imaging   1. Mild omental/peritoneal haziness, unchanged. No evidence of new metastatic disease. 2. Cirrhosis with splenomegaly.   09/04/2017 Tumor Marker   Patient's tumor was tested for the following markers: CA125 Results of the tumor marker test revealed 48.5   11/13/2017 Tumor Marker   Patient's tumor was tested for the following markers: CA125 Results of the tumor marker test revealed 55.8   12/17/2017 Imaging   1. No findings to suggest metastatic disease in the abdomen or pelvis. 2. Severe hepatic cirrhosis with evidence of portal hypertension, as demonstrated by dilated portal vein,  splenomegaly and portosystemic collateral pathways, including gastric and esophageal varices.  3. Colonic diverticulosis without evidence of acute diverticulitis at this time. 4. Additional incidental findings, as above.   01/16/2018 Tumor Marker   Patient's tumor was tested for the following markers: CA125 Results of the tumor marker test revealed 63.7   02/10/2018 Procedure   EGD - Recently bleeding grade III and large (> 5 mm) esophageal varices. Completely eradicated. Banded. - Portal hypertensive gastropathy. - Normal examined duodenum. - No specimens collected   02/13/2018 Tumor Marker   Patient's tumor was tested for the following markers: CA125 Results of the tumor marker test revealed 82.7   02/25/2018 Imaging   Bone density showed mild osteopenia   04/08/2018 Tumor Marker   Patient's tumor was tested for the following markers: CA125 Results of the tumor marker test revealed 82.7   04/08/2018 Imaging   1. No definite omental or peritoneal surface lesions. However, there are 2 slowly enlarging lymph nodes noted. One is in the small bowel mesentery and the other is in the left deep pelvis. Could not exclude recurrent disease. 2. Stable advanced  cirrhotic changes involving the liver with portal venous hypertension, portal venous collaterals, esophageal varices and splenomegaly. No worrisome hepatic lesions. 3. Diffuse wall thickening of the colon could suggest diffuse colitis or could be due to low albumin. Recommend correlation with any symptoms such as diarrhea.   05/21/2018 Tumor Marker   Patient's tumor was tested for the following markers: CA125 Results of the tumor marker test revealed 86   09/07/2018 Tumor Marker   Patient's tumor was tested for the following markers:CA-125 Results of the tumor marker test revealed 111   09/29/2018 Tumor Marker   Patient's tumor was tested for the following markers: CA-125 Results of the tumor marker test revealed 121.   09/29/2018 -  11/24/2018 Chemotherapy   The patient had carboplatin and gemzar for chemotherapy treatment.  Chemo was stopped due to allergic reaction to carboplatin   10/27/2018 Tumor Marker   Patient's tumor was tested for the following markers: CA-125 Results of the tumor marker test revealed 99.9   11/24/2018 Tumor Marker   Patient's tumor was tested for the following markers: CA-125 Results of the tumor marker test revealed 81.2   12/14/2018 Tumor Marker   Patient's tumor was tested for the following markers: CA-125 Results of the tumor marker test revealed 81.2   12/14/2018 Imaging   1. Mild improvement in peritoneal disease. Peritoneal nodule within the left posterior pelvis contiguous with the left side of vaginal cuff is slightly decreased in size in the interval. Within the right iliac fossa there is a small peritoneal nodule which has decreased in size in the interval. Central small bowel mesenteric lymph node is not significantly changed. Slight decrease in size left periaortic lymph node.    2. Morphologic features of the liver compatible with cirrhosis. Splenomegaly is noted which may reflect portal venous hypertension.   12/21/2018 Tumor Marker   Patient's tumor was tested for the following markers: CA-125 Results of the tumor marker test revealed 64.2   12/21/2018 -  Chemotherapy   The patient had taxotere for chemotherapy treatment.     03/02/2019 Tumor Marker   Patient's tumor was tested for the following markers: CA-125. 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   No new or progressive metastatic disease within the abdomen or pelvis.   Hepatic cirrhosis and findings of portal venous hypertension. 1.1 cm low-attenuation lesion within left hepatic lobe, which could represent a regenerative nodule or small hepatocellular carcinoma. Recommend abdomen MRI without and with contrast for  further characterization.     04/13/2019 Tumor Marker   Patient's tumor was tested for the following markers: CA-125 Results of the tumor marker test revealed 36.8   05/26/2019 Tumor Marker   Patient's tumor was tested for the following markers: CA-125 Results of the tumor marker test revealed 31.6.   06/15/2019 Tumor Marker   Patient's tumor was tested for the following markers: CA-125 Results of the tumor marker test revealed 25.4   07/05/2019 Imaging   1. Stable CT examination. Stable peritoneal thickening in the pelvis and paracolic gutters. Stable solid left deep pelvic implant. Stable trace ascites. No new or progressive metastatic disease. 2. Stable cirrhosis. No discrete liver masses. 3. Stable mild-to-moderate splenomegaly. 4. Aortic Atherosclerosis (ICD10-I70.0).     REVIEW OF SYSTEMS:   Constitutional: Denies fevers, chills or abnormal weight loss Eyes: Denies blurriness of vision Ears, nose, mouth, throat, and face: Denies mucositis or sore throat Respiratory: Denies  cough, dyspnea or wheezes Cardiovascular: Denies palpitation, chest discomfort or lower extremity swelling Gastrointestinal:  Denies nausea, heartburn or change in bowel habits Skin: Denies abnormal skin rashes Lymphatics: Denies new lymphadenopathy or easy bruising Neurological:Denies numbness, tingling or new weaknesses Behavioral/Psych: Mood is stable, no new changes  All other systems were reviewed with the patient and are negative.  I have reviewed the past medical history, past surgical history, social history and family history with the patient and they are unchanged from previous note.  ALLERGIES:  is allergic to carboplatin, shellfish allergy, vancomycin, benadryl [diphenhydramine hcl], and penicillins.  MEDICATIONS:  Current Outpatient Medications  Medication Sig Dispense Refill  . cholecalciferol (VITAMIN D) 1000 units tablet Take 1,000 Units by mouth at bedtime.     Marland Kitchen dexamethasone (DECADRON)  4 MG tablet Start the day before Taxotere, take 1 tablet in the morning, and then 1 tablet daily after chemo for 2 days. Pls dispense 30 tabs (Patient taking differently: Take 4 mg by mouth See admin instructions. Start the day before Taxotere, take 1 tablet in the morning, and then 1 tablet daily after chemo for 2 days. Pls dispense 30 tabs) 30 tablet 1  . gabapentin (NEURONTIN) 300 MG capsule Take 3 capsules (900 mg total) by mouth 2 (two) times daily. 120 capsule 11  . HUMALOG MIX 75/25 KWIKPEN (75-25) 100 UNIT/ML Kwikpen Inject 10 Units into the skin 2 (two) times daily. Before breakfast & before supper Except when taking Chemo take 15 units  For five days    . HYDROcodone-acetaminophen (NORCO/VICODIN) 5-325 MG tablet Take 1 tablet by mouth every 6 (six) hours as needed for moderate pain. 60 tablet 0  . lidocaine-prilocaine (EMLA) cream Apply to Porta-cath  1-2 hours prior to access as directed. (Patient taking differently: Apply 1 application topically daily as needed (port access). ) 30 g 9  . nadolol (CORGARD) 20 MG tablet Take 1 tablet (20 mg total) by mouth daily. (Patient taking differently: Take 20 mg by mouth at bedtime. ) 90 tablet 3  . ondansetron (ZOFRAN) 8 MG tablet Take 1 tablet (8 mg total) by mouth every 8 (eight) hours as needed. Start on the third day after chemotherapy. (Patient taking differently: Take 8 mg by mouth every 8 (eight) hours as needed for nausea. Start on the third day after chemotherapy.) 30 tablet 1  . pantoprazole (PROTONIX) 40 MG tablet Take 1 tablet (40 mg total) by mouth daily.    Marland Kitchen PROAIR HFA 108 (90 Base) MCG/ACT inhaler Inhale 2 puffs into the lungs every 4 (four) hours as needed for wheezing or shortness of breath.     . prochlorperazine (COMPAZINE) 10 MG tablet Take 1 tablet (10 mg total) by mouth every 6 (six) hours as needed (Nausea or vomiting). 30 tablet 1  . rotigotine (NEUPRO) 4 MG/24HR Place 1 patch onto the skin daily. 30 patch 5   No current  facility-administered medications for this visit.   Facility-Administered Medications Ordered in Other Visits  Medication Dose Route Frequency Provider Last Rate Last Admin  . DOCEtaxel (TAXOTERE) 70 mg in sodium chloride 0.9 % 150 mL chemo infusion  37.5 mg/m2 (Treatment Plan Recorded) Intravenous Once Alvy Bimler, Shivaay Stormont, MD 157 mL/hr at 07/27/19 1356 70 mg at 07/27/19 1356  . heparin lock flush 100 unit/mL  500 Units Intracatheter Once PRN Lafe Clerk, MD      . pegfilgrastim (NEULASTA ONPRO KIT) injection 6 mg  6 mg Subcutaneous Once [name], MD      .  sodium chloride flush (NS) 0.9 % injection 10 mL  10 mL Intracatheter PRN Alvy Bimler, Sandra Tellefsen, MD        PHYSICAL EXAMINATION: ECOG PERFORMANCE STATUS: 1 - Symptomatic but completely ambulatory  Vitals:   07/27/19 1156  BP: (!) 141/66  Pulse: 70  Resp: 18  Temp: 98.2 F (36.8 C)  SpO2: 100%   Filed Weights   07/27/19 1156  Weight: 195 lb 3.2 oz (88.5 kg)    GENERAL:alert, no distress and comfortable SKIN: skin color, texture, turgor are normal, no rashes or significant lesions EYES: normal, Conjunctiva are pink and non-injected, sclera clear OROPHARYNX:no exudate, no erythema and lips, buccal mucosa, and tongue normal  NECK: supple, thyroid normal size, non-tender, without nodularity LYMPH:  no palpable lymphadenopathy in the cervical, axillary or inguinal LUNGS: clear to auscultation and percussion with normal breathing effort HEART: regular rate & rhythm and no murmurs and no lower extremity edema ABDOMEN:abdomen soft, non-tender and normal bowel sounds Musculoskeletal:no cyanosis of digits and no clubbing  NEURO: alert & oriented x 3 with fluent speech, no focal motor/sensory deficits  LABORATORY DATA:  I have reviewed the data as listed    Component Value Date/Time   NA 145 07/27/2019 1128   NA 142 02/06/2017 0742   K 3.8 07/27/2019 1128   K 3.9 02/06/2017 0742   CL 110 07/27/2019 1128   CO2 23 07/27/2019 1128   CO2 22  02/06/2017 0742   GLUCOSE 137 (H) 07/27/2019 1128   GLUCOSE 156 (H) 02/06/2017 0742   BUN 19 07/27/2019 1128   BUN 13.5 02/06/2017 0742   CREATININE 0.82 07/27/2019 1128   CREATININE 0.8 02/06/2017 0742   CALCIUM 8.6 (L) 07/27/2019 1128   CALCIUM 8.5 02/06/2017 0742   PROT 7.0 07/27/2019 1128   PROT 6.9 02/06/2017 0742   ALBUMIN 3.9 07/27/2019 1128   ALBUMIN 3.7 02/06/2017 0742   AST 22 07/27/2019 1128   AST 47 (H) 02/06/2017 0742   ALT 31 07/27/2019 1128   ALT 54 02/06/2017 0742   ALKPHOS 127 (H) 07/27/2019 1128   ALKPHOS 130 02/06/2017 0742   BILITOT 0.7 07/27/2019 1128   BILITOT 0.65 02/06/2017 0742   GFRNONAA >60 07/27/2019 1128   GFRAA >60 07/27/2019 1128    No results found for: SPEP, UPEP  Lab Results  Component Value Date   WBC 8.3 07/27/2019   NEUTROABS 6.6 07/27/2019   HGB 10.5 (L) 07/27/2019   HCT 32.7 (L) 07/27/2019   MCV 97.9 07/27/2019   PLT 97 (L) 07/27/2019      Chemistry      Component Value Date/Time   NA 145 07/27/2019 1128   NA 142 02/06/2017 0742   K 3.8 07/27/2019 1128   K 3.9 02/06/2017 0742   CL 110 07/27/2019 1128   CO2 23 07/27/2019 1128   CO2 22 02/06/2017 0742   BUN 19 07/27/2019 1128   BUN 13.5 02/06/2017 0742   CREATININE 0.82 07/27/2019 1128   CREATININE 0.8 02/06/2017 0742      Component Value Date/Time   CALCIUM 8.6 (L) 07/27/2019 1128   CALCIUM 8.5 02/06/2017 0742   ALKPHOS 127 (H) 07/27/2019 1128   ALKPHOS 130 02/06/2017 0742   AST 22 07/27/2019 1128   AST 47 (H) 02/06/2017 0742   ALT 31 07/27/2019 1128   ALT 54 02/06/2017 0742   BILITOT 0.7 07/27/2019 1128   BILITOT 0.65 02/06/2017 0742       RADIOGRAPHIC STUDIES: I have personally reviewed the radiological images as  listed and agreed with the findings in the report. CT ABDOMEN PELVIS W CONTRAST  Result Date: 07/05/2019 CLINICAL DATA:  Ovarian cancer with ongoing chemotherapy. Restaging. EXAM: CT ABDOMEN AND PELVIS WITH CONTRAST TECHNIQUE: Multidetector CT  imaging of the abdomen and pelvis was performed using the standard protocol following bolus administration of intravenous contrast. CONTRAST:  133m OMNIPAQUE IOHEXOL 300 MG/ML  SOLN COMPARISON:  03/22/2019 CT abdomen/pelvis. FINDINGS: Lower chest: Stable parenchymal bands at both lung bases. No acute abnormality at the lung bases. Hepatobiliary: Diffusely irregular liver surface compatible with cirrhosis, unchanged. No discrete liver masses. Stable tiny 3 mm polyp versus stone in the dependent gallbladder, which is otherwise normal. No biliary ductal dilatation. Pancreas: Normal, with no mass or duct dilation. Spleen: Stable mild-to-moderate splenomegaly. Craniocaudal splenic length 15.6 cm. No splenic mass. Adrenals/Urinary Tract: Normal adrenals. Stable asymmetric left renal atrophy. No hydronephrosis. No renal masses. Normal bladder. Stomach/Bowel: Normal non-distended stomach. Normal caliber small bowel with no small bowel wall thickening. Oral contrast transits to right colon. Appendix not discretely visualized. Mild sigmoid diverticulosis with no large bowel wall thickening or acute pericolonic fat stranding. Vascular/Lymphatic: Atherosclerotic nonaneurysmal abdominal aorta. Patent portal, splenic, hepatic and renal veins. Retroaortic left renal vein. No pathologically enlarged lymph nodes in the abdomen or pelvis. Reproductive: Status post hysterectomy. No adnexal masses. Stable thickening of the bilateral deep pelvic peritoneum. Solid left deep pelvic 1.6 x 1.2 cm implant (series 3/image 74), previously 1.6 x 1.4 cm, not substantially changed. Other: No focal fluid collection. No free air. Trace perihepatic and pelvic ascites, unchanged. Stable peritoneal thickening in the paracolic gutters bilaterally. Stable tiny fat containing umbilical hernia. No new focal peritoneal implants. Musculoskeletal: No aggressive appearing focal osseous lesions. Marked lumbar spondylosis with chronic superior L3 vertebral  endplate deformity. IMPRESSION: 1. Stable CT examination. Stable peritoneal thickening in the pelvis and paracolic gutters. Stable solid left deep pelvic implant. Stable trace ascites. No new or progressive metastatic disease. 2. Stable cirrhosis. No discrete liver masses. 3. Stable mild-to-moderate splenomegaly. 4. Aortic Atherosclerosis (ICD10-I70.0). Electronically Signed   By: JIlona SorrelM.D.   On: 07/05/2019 13:55

## 2019-07-27 NOTE — Patient Instructions (Signed)
[facility] Discharge Instructions for Patients Receiving Chemotherapy  Today you received the following chemotherapy agents: docetaxel.  To help prevent nausea and vomiting after your treatment, we encourage you to take your nausea medication as directed.   If you develop nausea and vomiting that is not controlled by your nausea medication, call the clinic.   BELOW ARE SYMPTOMS THAT SHOULD BE REPORTED IMMEDIATELY:  *FEVER GREATER THAN 100.5 F  *CHILLS WITH OR WITHOUT FEVER  NAUSEA AND VOMITING THAT IS NOT CONTROLLED WITH YOUR NAUSEA MEDICATION  *UNUSUAL SHORTNESS OF BREATH  *UNUSUAL BRUISING OR BLEEDING  TENDERNESS IN MOUTH AND THROAT WITH OR WITHOUT PRESENCE OF ULCERS  *URINARY PROBLEMS  *BOWEL PROBLEMS  UNUSUAL RASH Items with * indicate a potential emergency and should be followed up as soon as possible.  Feel free to call the clinic should you have any questions or concerns. The clinic phone number is [phone].  Please show the [place] at check-in to the Emergency Department and triage nurse.

## 2019-07-27 NOTE — Assessment & Plan Note (Addendum)
She has intermittent iron deficiency anemia due to chronic GI bleed Iron studies are pending I will schedule intravenous iron infusion if needed If she continues to have frequent melena, she will need repeat EGD

## 2019-07-27 NOTE — Assessment & Plan Note (Signed)
She has multifactorial pancytopenia, due to recent chemotherapy, liver disease with splenomegaly as well as history of iron deficiency She will likely need intravenous iron infusion She does not need treatment for thrombocytopenia

## 2019-07-27 NOTE — Assessment & Plan Note (Signed)
Her tumor marker is improving CT scan shows stable disease She tolerated chemotherapy fairly well except for mild pancytopenia and neuropathy We will proceed with treatment without delay We will continue treatment for few more months, her next imaging study will be due around August or September

## 2019-07-28 ENCOUNTER — Telehealth: Payer: Self-pay | Admitting: *Deleted

## 2019-07-28 LAB — CA 125: Cancer Antigen (CA) 125: 35.4 U/mL (ref 0.0–38.1)

## 2019-07-28 NOTE — Telephone Encounter (Signed)
Per Dr.Gorsuch, called to make pt aware, even though she has mild anemia, Iron is ok, therefore, no Iron IV is needed right now. Pt verbalized understanding

## 2019-08-17 ENCOUNTER — Telehealth: Payer: Self-pay

## 2019-08-17 ENCOUNTER — Telehealth: Payer: Self-pay | Admitting: Hematology and Oncology

## 2019-08-17 ENCOUNTER — Encounter: Payer: Self-pay | Admitting: Hematology and Oncology

## 2019-08-17 ENCOUNTER — Inpatient Hospital Stay: Payer: Medicare Other

## 2019-08-17 ENCOUNTER — Other Ambulatory Visit: Payer: Self-pay

## 2019-08-17 ENCOUNTER — Inpatient Hospital Stay: Payer: Medicare Other | Attending: Hematology and Oncology | Admitting: Hematology and Oncology

## 2019-08-17 VITALS — BP 144/70 | HR 76 | Temp 97.8°F | Resp 18 | Ht 62.0 in | Wt 200.8 lb

## 2019-08-17 DIAGNOSIS — D61818 Other pancytopenia: Secondary | ICD-10-CM | POA: Diagnosis not present

## 2019-08-17 DIAGNOSIS — G629 Polyneuropathy, unspecified: Secondary | ICD-10-CM | POA: Diagnosis not present

## 2019-08-17 DIAGNOSIS — G8929 Other chronic pain: Secondary | ICD-10-CM | POA: Insufficient documentation

## 2019-08-17 DIAGNOSIS — Z7952 Long term (current) use of systemic steroids: Secondary | ICD-10-CM | POA: Insufficient documentation

## 2019-08-17 DIAGNOSIS — R161 Splenomegaly, not elsewhere classified: Secondary | ICD-10-CM | POA: Diagnosis not present

## 2019-08-17 DIAGNOSIS — Z7189 Other specified counseling: Secondary | ICD-10-CM

## 2019-08-17 DIAGNOSIS — Z79899 Other long term (current) drug therapy: Secondary | ICD-10-CM | POA: Diagnosis not present

## 2019-08-17 DIAGNOSIS — D696 Thrombocytopenia, unspecified: Secondary | ICD-10-CM | POA: Diagnosis not present

## 2019-08-17 DIAGNOSIS — M545 Low back pain, unspecified: Secondary | ICD-10-CM

## 2019-08-17 DIAGNOSIS — C482 Malignant neoplasm of peritoneum, unspecified: Secondary | ICD-10-CM

## 2019-08-17 DIAGNOSIS — C569 Malignant neoplasm of unspecified ovary: Secondary | ICD-10-CM

## 2019-08-17 DIAGNOSIS — D509 Iron deficiency anemia, unspecified: Secondary | ICD-10-CM

## 2019-08-17 DIAGNOSIS — E1140 Type 2 diabetes mellitus with diabetic neuropathy, unspecified: Secondary | ICD-10-CM | POA: Diagnosis not present

## 2019-08-17 DIAGNOSIS — Z5111 Encounter for antineoplastic chemotherapy: Secondary | ICD-10-CM | POA: Diagnosis present

## 2019-08-17 DIAGNOSIS — D500 Iron deficiency anemia secondary to blood loss (chronic): Secondary | ICD-10-CM | POA: Insufficient documentation

## 2019-08-17 DIAGNOSIS — K7460 Unspecified cirrhosis of liver: Secondary | ICD-10-CM | POA: Diagnosis not present

## 2019-08-17 LAB — CMP (CANCER CENTER ONLY)
ALT: 28 U/L (ref 0–44)
AST: 22 U/L (ref 15–41)
Albumin: 3.7 g/dL (ref 3.5–5.0)
Alkaline Phosphatase: 121 U/L (ref 38–126)
Anion gap: 8 (ref 5–15)
BUN: 19 mg/dL (ref 8–23)
CO2: 22 mmol/L (ref 22–32)
Calcium: 8.5 mg/dL — ABNORMAL LOW (ref 8.9–10.3)
Chloride: 111 mmol/L (ref 98–111)
Creatinine: 0.84 mg/dL (ref 0.44–1.00)
GFR, Est AFR Am: >60 mL/min (ref >60)
GFR, Estimated: >60 mL/min (ref >60)
Glucose, Bld: 175 mg/dL — ABNORMAL HIGH (ref 70–99)
Potassium: 3.9 mmol/L (ref 3.5–5.1)
Sodium: 141 mmol/L (ref 135–145)
Total Bilirubin: 0.6 mg/dL (ref 0.3–1.2)
Total Protein: 6.8 g/dL (ref 6.5–8.1)

## 2019-08-17 LAB — IRON AND TIBC
Iron: 67 ug/dL (ref 41–142)
Saturation Ratios: 20 % — ABNORMAL LOW (ref 21–57)
TIBC: 345 ug/dL (ref 236–444)
UIBC: 278 ug/dL (ref 120–384)

## 2019-08-17 LAB — CBC WITH DIFFERENTIAL (CANCER CENTER ONLY)
Abs Immature Granulocytes: 0.04 10*3/uL (ref 0.00–0.07)
Basophils Absolute: 0 10*3/uL (ref 0.0–0.1)
Basophils Relative: 0 %
Eosinophils Absolute: 0 10*3/uL (ref 0.0–0.5)
Eosinophils Relative: 0 %
HCT: 32.2 % — ABNORMAL LOW (ref 36.0–46.0)
Hemoglobin: 10.3 g/dL — ABNORMAL LOW (ref 12.0–15.0)
Immature Granulocytes: 1 %
Lymphocytes Relative: 13 %
Lymphs Abs: 0.9 10*3/uL (ref 0.7–4.0)
MCH: 31.2 pg (ref 26.0–34.0)
MCHC: 32 g/dL (ref 30.0–36.0)
MCV: 97.6 fL (ref 80.0–100.0)
Monocytes Absolute: 0.4 10*3/uL (ref 0.1–1.0)
Monocytes Relative: 6 %
Neutro Abs: 5.9 10*3/uL (ref 1.7–7.7)
Neutrophils Relative %: 80 %
Platelet Count: 91 10*3/uL — ABNORMAL LOW (ref 150–400)
RBC: 3.3 MIL/uL — ABNORMAL LOW (ref 3.87–5.11)
RDW: 17 % — ABNORMAL HIGH (ref 11.5–15.5)
WBC Count: 7.3 10*3/uL (ref 4.0–10.5)
nRBC: 0 % (ref 0.0–0.2)

## 2019-08-17 LAB — FERRITIN: Ferritin: 75 ng/mL (ref 11–307)

## 2019-08-17 MED ORDER — SODIUM CHLORIDE 0.9 % IV SOLN
Freq: Once | INTRAVENOUS | Status: AC
  Filled 2019-08-17: qty 250

## 2019-08-17 MED ORDER — DEXAMETHASONE SODIUM PHOSPHATE 10 MG/ML IJ SOLN
INTRAMUSCULAR | Status: AC
  Filled 2019-08-17: qty 1

## 2019-08-17 MED ORDER — SODIUM CHLORIDE 0.9% FLUSH
10.0000 mL | Freq: Once | INTRAVENOUS | Status: AC
  Administered 2019-08-17: 10 mL
  Filled 2019-08-17: qty 10

## 2019-08-17 MED ORDER — PEGFILGRASTIM 6 MG/0.6ML ~~LOC~~ PSKT
PREFILLED_SYRINGE | SUBCUTANEOUS | Status: AC
  Filled 2019-08-17: qty 0.6

## 2019-08-17 MED ORDER — SODIUM CHLORIDE 0.9 % IV SOLN
37.5000 mg/m2 | Freq: Once | INTRAVENOUS | Status: AC
  Administered 2019-08-17: 70 mg via INTRAVENOUS
  Filled 2019-08-17: qty 7

## 2019-08-17 MED ORDER — DEXAMETHASONE SODIUM PHOSPHATE 10 MG/ML IJ SOLN
5.0000 mg | Freq: Once | INTRAMUSCULAR | Status: AC
  Administered 2019-08-17: 5 mg via INTRAVENOUS

## 2019-08-17 MED ORDER — SODIUM CHLORIDE 0.9% FLUSH
10.0000 mL | INTRAVENOUS | Status: DC | PRN
  Administered 2019-08-17: 10 mL
  Filled 2019-08-17: qty 10

## 2019-08-17 MED ORDER — HEPARIN SOD (PORK) LOCK FLUSH 100 UNIT/ML IV SOLN
500.0000 [IU] | Freq: Once | INTRAVENOUS | Status: AC | PRN
  Administered 2019-08-17: 500 [IU]
  Filled 2019-08-17: qty 5

## 2019-08-17 MED ORDER — PEGFILGRASTIM 6 MG/0.6ML ~~LOC~~ PSKT
6.0000 mg | PREFILLED_SYRINGE | Freq: Once | SUBCUTANEOUS | Status: AC
  Administered 2019-08-17: 6 mg via SUBCUTANEOUS

## 2019-08-17 MED ORDER — HYDROCODONE-ACETAMINOPHEN 5-325 MG PO TABS: 1.0000 | ORAL_TABLET | Freq: Four times a day (QID) | ORAL | 0 refills | Status: DC | PRN

## 2019-08-17 NOTE — Telephone Encounter (Signed)
-----   Message from [name], MD sent at 08/17/2019 12:27 PM EDT ----- Regarding: iron Tell her iron study is still good but she might need IV iron next visit We will recheck again next time

## 2019-08-17 NOTE — Telephone Encounter (Signed)
Scheduled appts per 7/6 sch msg. Pt declined print out of AVS and stated she would refer to mychart.

## 2019-08-17 NOTE — Assessment & Plan Note (Signed)
Her tumor marker is improving CT scan shows stable disease She tolerated chemotherapy fairly well except for mild pancytopenia and neuropathy We will proceed with treatment without delay We will continue treatment for few more months, her next imaging study will be due around August or September

## 2019-08-17 NOTE — Assessment & Plan Note (Signed)
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

## 2019-08-17 NOTE — Progress Notes (Signed)
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 Iron and TIBC    Standing Status:   Standing    Number of Occurrences:   3    Standing Expiration Date:   08/16/2020  . Ferritin    Standing Status:   Standing    Number of Occurrences:   3    Standing Expiration Date:   08/16/2020    All questions were answered. The patient knows to call the clinic with any problems, questions or concerns. The total time spent in the appointment was 20 minutes encounter with patients including review of chart and various tests results, discussions about plan of care and coordination of care plan   [name], MD 08/17/2019 11:02 AM  INTERVAL HISTORY: Please see below for problem oriented charting. 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 Had allergic reaction to carboplatin. Avastin was stopped due to GI hemorrhage   Primary peritoneal carcinomatosis ([place])  09/06/2011 Imaging   CT findings consistent with cirrhotic changes involving the liver.  No worrisome liver mass.  There are associated portal venous collaterals and splenomegaly consistent with portal venous hypertension. 2.  No other significant upper abdominal findings   11/08/2013 Imaging   US showed ascites   11/08/2013 Initial Diagnosis   Patient had ~ 2 months of some urinary incontinence, saw Dr Ronita Hipps in 10-2013, with pelvic mass on exam    11/12/2013 Imaging   There are findings of progressive hepatic cirrhosis with a large amount of ascites and mild splenomegaly. There are venous collaterals present. 2. There is abnormal thickening of the peritoneal surface along the left mid and lower abdominal wall. There is abnormal soft tissue density in the pelvis as well which could reflect the clinically suspected ovarian malignancy but the findings are nondiagnostic. 3. There is a new small left pleural effusion. 4. There is no acute bowel abnormality.   11/18/2013 Imaging   Successful ultrasound guided diagnostic and therapeutic paracentesis yielding 4.1 liters of ascites.  11/18/2013 Pathology Results   PERITONEAL/ASCITIC FLUID (SPECIMEN 1 OF 1 COLLECTED 11/18/13): SEROUS CARCINOMA, PLEASE SEE COMMENT.   12/01/2013 Tumor Marker   Patient's tumor was tested for the following markers: CA125 Results of the tumor marker test revealed 1938    12/03/2013 Imaging   Successful ultrasound-guided therapeutic paracentesis yielding 3.6 liters of peritoneal fluid.   12/07/2013 - 05/30/2014 Chemotherapy   She received 3 cycles of carboplatin and Taxol, interrupted cycle 4 for surgery.  She subsequently completed 3 more cycles of chemotherapy after surgery   12/20/2013 Imaging   Successful ultrasound-guided diagnostic and therapeutic paracentesis yielding 2.2 liters of peritoneal fluid   12/20/2013 Pathology Results   PERITONEAL/ASCITIC FLUID(SPECIMEN 1 OF 1 COLLECTED 12/20/13): MALIGNANT CELLS CONSISTENT WITH SEROUS CARCINOMA   01/13/2014 Tumor Marker   Patient's tumor was tested for the following markers: CA125 Results of the tumor marker test revealed 227   02/07/2014 Tumor Marker   Patient's tumor was tested for the following markers: CA125 Results of the tumor marker test revealed 130   02/15/2014 Genetic Testing   Genetics testing from 02-2014 normal ([employer] panel)   02/23/2014 Imaging   Interval decrease in ascites. 2. Morphologic changes in the liver consistent with cirrhosis. 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 Portal vein remains patent at this time. 3. Persistent but decreased abnormal soft tissue attenuation tracking in the omental fat. This may be secondary to interval improvement in metastatic disease. 4. Peritoneal thickening seen in the left abdomen and pelvis on the previous study has decreased and nearly resolved in the interval. This also suggests interval improvement in metastatic disease.    03/07/2014 Procedure   Ultrasound and fluoroscopically guided right internal jugular single lumen power port catheter insertion. Tip in the SVC/RA junction. Catheter ready for use.   03/17/2014 Tumor Marker   Patient's tumor was tested for the following markers: CA125 Results of the tumor marker test revealed 45   03/29/2014 Pathology Results   1. Omentum, resection for tumor - HIGH  GRADE SEROUS CARCINOMA, SEE COMMENT. 2. Ovary and fallopian tube, right - HIGH GRADE SEROUS CARCINOMA, SEE COMMENT. 3. Ovary and fallopian tube, left - HIGH GRADE SEROUS CARCINOMA, SEE COMMENT. Diagnosis Note 1. Nests and clusters of malignant cells are invading the omental tissue (part #1) with associated fibrosis. The cells are pleomorphic with prominent nucleoli. There are scattered psammoma bodies. The ovaries are atrophic and exhibit multiple foci of surface based invasive carcinoma and associated fibrosis. The fallopian tubes have a few foci of carcinoma, also superficially located. There are no precursor lesions noted in the ovaries or fallopian tubes (the entire tubes and ovaries were submitted for evaluation). While there is some retraction artifact, there are several foci suspicious for lymphovascular invasion. 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 Given the fibrosis, there does appear to be a small amount of treatment effect, however, there is abundant residual tumor.    03/29/2014 Surgery   Procedure(s) Performed: 1. Exploratory laparotomy with bilateral salpingo-oophorectomy, omentectomy radical tumor debulking for ovarian cancer .  Surgeon: Thereasa Solo, MD.  Assistant Surgeon: Lahoma Crocker, M.D. Assistant: (an MD assistant was necessary for tissue manipulation, retraction and positioning due to the complexity of the case and hospital policies).  Operative Findings: 10cm omental cake from hepatic to splenic flexure, densely adherent to transverse colon. Milliary studding of tumor implants (<79m, too  numerous in number to count) adherent to the mesentery of the small bowel and small bowel wall. Small volume (100cc) ascites. Small ovaries bilaterally, densely adherent to the pelvic cul de sac. Left ovary and tube densely adherent to sigmoid  colon. Cirrhotic liver with hepatomegaly and splenomegaly.     This represented an optimal cytoreduction (R1) with visible disease residual on the bowel wall and mesentery (millial, <33m implants).     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 2. 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    08/18/2014 Tumor Marker   Patient's tumor was tested for the following markers: CA125 Results of the tumor marker test revealed 16   10/13/2014 Tumor Marker   Patient's tumor was tested for the following markers: CA125 Results of the tumor marker test revealed 14   11/21/2014 Tumor Marker   Patient's tumor was tested for the following markers: CA125 Results of the tumor marker test revealed 16   12/28/2014 Tumor Marker   Patient's tumor was tested for the following markers: CA125 Results of the tumor marker test revealed 762   02/27/2015 Tumor Marker   Patient's tumor was tested for the following markers: CA125 Results of the tumor marker test revealed 20.2   03/22/2015 Imaging   Filler appearance to the prior exam, with very subtle fluid tracking along portions of the liver, and very minimal nodularity along the right paracolic gutter representing residua from the prior peritoneal cancer. The current abnormalities could simply be post therapy findings rather than necessarily representing residual malignancy. No new or enlarging lesions are identified. 2. Hepatic cirrhosis and splenomegaly. There is some gastric  varices suggesting portal venous hypertension. 3. Left foraminal stenosis at L4-5 due to spurring. There is likely also some central narrowing of the thecal sac at this level. 4. Bibasilar scarring. 5. Chronic mild left mid kidney scarring   03/27/2015 Tumor Marker   Patient's tumor was tested for the following markers: CA125 Results of the tumor marker test revealed 25.3   04/24/2015 Imaging   Disc bulge L2-3 with mild spinal stenosis and right foraminal encroachment. 2. Disc bulge L3-4 with mild spinal stenosis. 3. Multifactorial spinal, left lateral recess and foraminal stenosis L4-5. There is grade 1 anterolisthesis without evident dynamic instability.   05/29/2015 Tumor Marker   Patient's tumor was tested for the following markers: CA125 Results of the tumor marker test revealed 29.9   08/21/2015 Tumor Marker   Patient's tumor was tested for the following markers: CA125 Results of the tumor marker test revealed 45   09/18/2015 Tumor Marker   Patient's tumor was tested for the following markers: CA125 Results of the tumor marker test revealed 47   09/27/2015 Imaging   New small bowel mesenteric nodule and enlarged left external iliac lymph node, highly worrisome for metastatic disease. 2. Cirrhosis and splenomegaly   10/10/2015 Tumor Marker   Patient's tumor was tested for the following markers: CA125 Results of the tumor marker test revealed 53.4   10/10/2015 - 12/11/2015 Chemotherapy  She received 4 cycles of carboplatin only   11/20/2015 Tumor Marker   Patient's tumor was tested for the following markers: CA125 Results of the tumor marker test revealed 41.3   12/20/2015 Imaging   CT: Mild left external iliac lymphadenopathy is slightly decreased. Mildly enlarged lower mesenteric nodule is slightly decreased. 2. No new or progressive metastatic disease in the abdomen or pelvis. 3. Cirrhosis. Stable subcentimeter hypodense left liver lobe lesion. No new liver lesions. 4. Stable mild  splenomegaly.  No ascites. 5. Mild sigmoid diverticulosis.   01/01/2016 -  Anti-estrogen oral therapy   She was placed on tamoxifen. Had letrozole initially but switched to tamoxifen due to poor tolerance   01/15/2016 Tumor Marker   Patient's tumor was tested for the following markers: CA125 Results of the tumor marker test revealed 31.4   02/19/2016 Tumor Marker   Patient's tumor was tested for the following markers: CA125 Results of the tumor marker test revealed 36.5   05/13/2016 Imaging   No evidence of disease progression within the abdomen or pelvis. Previously noted small central mesenteric nodule and left external iliac node appear slightly smaller. 2. Stable changes of hepatic cirrhosis and portal hypertension with associated splenomegaly. No new or enlarging hepatic lesions are identified. 3. No acute findings.   05/13/2016 Tumor Marker   Patient's tumor was tested for the following markers: CA125 Results of the tumor marker test revealed 41.2   06/24/2016 Tumor Marker   Patient's tumor was tested for the following markers: CA125 Results of the tumor marker test revealed 47.3   08/20/2016 Imaging   1. The left external iliac lymph node is slightly increased in size compared to the prior exam, currently 1.1 cm and previously 0.9 cm in diameter. 2. Stable central mesenteric lymph node at 0.9 cm in diameter. 3. Stable trace free pelvic fluid and stable slight thickening along the right paracolic gutter, without well-defined peritoneal nodularity. 4. Other imaging findings of potential clinical significance: Subsegmental atelectasis or scarring in the lung bases. Hepatic cirrhosis. Left renal scarring. Mild splenomegaly. Sigmoid colon diverticulosis. Impingement at L4-5 due to spondylosis and degenerative disc disease broad Schmorl' s nodes at L3-L4. Pelvic floor laxity   08/23/2016 Tumor Marker   Patient's tumor was tested for the following markers: CA125 Results of the tumor marker test  revealed 80.2   09/04/2016 Imaging   LV EF: 60% -   65%   09/12/2016 Tumor Marker   Patient's tumor was tested for the following markers: CA125 Results of the tumor marker test revealed 98.5   09/12/2016 - 01/29/2018 Chemotherapy   The patient received Doxil and Avastin. Avastin was discontinued in Dec 2019 due to GI hemorrhage   09/26/2016 Tumor Marker   Patient's tumor was tested for the following markers: CA125 Results of the tumor marker test revealed 68.9   10/10/2016 Tumor Marker   Patient's tumor was tested for the following markers: CA125 Results of the tumor marker test revealed 65.6   11/07/2016 Tumor Marker   Patient's tumor was tested for the following markers: CA125 Results of the tumor marker test revealed 46.4   11/29/2016 Imaging   Stable mild peritoneal thickening, suspicious for peritoneal carcinomatosis. No new or progressive disease identified. No evidence of ascites.  No significant change in 10 mm left external iliac and 8 mm mesenteric lymph nodes.  Stable hepatic cirrhosis, and splenomegaly consistent with portal venous hypertension.  Colonic diverticulosis, without radiographic evidence of diverticulitis.   12/02/2016 Imaging   Normal LV size  with EF 55%. Basal inferior and basal inferoseptal hypokinesis. Normal RV size and systolic function. No significant valvular abnormalities.   12/05/2016 Tumor Marker   Patient's tumor was tested for the following markers: CA125 Results of the tumor marker test revealed 49.1   01/09/2017 Tumor Marker   Patient's tumor was tested for the following markers: CA125 Results of the tumor marker test revealed 47.2   02/06/2017 Tumor Marker   Patient's tumor was tested for the following markers: CA125 Results of the tumor marker test revealed 44.1   02/18/2017 Imaging   Stable mild peritoneal thickening. No new or progressive disease identified within the abdomen or pelvis.  Stable tiny sub-cm left external iliac and  mesenteric lymph nodes.  Stable hepatic cirrhosis, and splenomegaly consistent with portal venous hypertension. No evidence of hepatic neoplasm.  Colonic diverticulosis, without radiographic evidence of diverticulitis.   03/06/2017 Tumor Marker   Patient's tumor was tested for the following markers: CA125 Results of the tumor marker test revealed 50.4   03/17/2017 Imaging   - Left ventricle: The cavity size was normal. Wall thickness was normal. Systolic function was normal. The estimated ejection fraction was in the range of 55% to 60%. Wall motion was normal; there were no regional wall motion abnormalities. Features are consistent with a pseudonormal left ventricular filling pattern, with concomitant abnormal relaxation and increased filling pressure (grade 2 diastolic dysfunction). - Mitral valve: There was mild regurgitation. - Left atrium: The atrium was mildly dilated   04/03/2017 Tumor Marker   Patient's tumor was tested for the following markers: CA125 Results of the tumor marker test revealed 47.2   05/14/2017 Imaging   CT scan of abdomen and pelvis Stable mild peritoneal thickening. No new or progressive disease within the abdomen or pelvis.  Stable hepatic cirrhosis. Stable splenomegaly, consistent with portal venous hypertension. No evidence of hepatic neoplasm.   05/15/2017 Tumor Marker   Patient's tumor was tested for the following markers: CA125 Results of the tumor marker test revealed 56.1   06/12/2017 Tumor Marker   Patient's tumor was tested for the following markers: CA125 Results of the tumor marker test revealed 49.2   07/10/2017 Tumor Marker   Patient's tumor was tested for the following markers: CA125 Results of the tumor marker test revealed 46.3   08/07/2017 Tumor Marker   Patient's tumor was tested for the following markers: CA125 Results of the tumor marker test revealed 52.9   08/20/2017 Imaging   1. Mild omental/peritoneal haziness, unchanged. No evidence  of new metastatic disease. 2. Cirrhosis with splenomegaly.   09/04/2017 Tumor Marker   Patient's tumor was tested for the following markers: CA125 Results of the tumor marker test revealed 48.5   11/13/2017 Tumor Marker   Patient's tumor was tested for the following markers: CA125 Results of the tumor marker test revealed 55.8   12/17/2017 Imaging   1. No findings to suggest metastatic disease in the abdomen or pelvis. 2. Severe hepatic cirrhosis with evidence of portal hypertension, as demonstrated by dilated portal vein, splenomegaly and portosystemic collateral pathways, including gastric and esophageal varices.  3. Colonic diverticulosis without evidence of acute diverticulitis at this time. 4. Additional incidental findings, as above.   01/16/2018 Tumor Marker   Patient's tumor was tested for the following markers: CA125 Results of the tumor marker test revealed 63.7   02/10/2018 Procedure   EGD - Recently bleeding grade III and large (> 5 mm) esophageal varices. Completely eradicated. Banded. - Portal hypertensive gastropathy. - Normal examined  duodenum. - No specimens collected   02/13/2018 Tumor Marker   Patient's tumor was tested for the following markers: CA125 Results of the tumor marker test revealed 82.7   02/25/2018 Imaging   Bone density showed mild osteopenia   04/08/2018 Tumor Marker   Patient's tumor was tested for the following markers: CA125 Results of the tumor marker test revealed 82.7   04/08/2018 Imaging   1. No definite omental or peritoneal surface lesions. However, there are 2 slowly enlarging lymph nodes noted. One is in the small bowel mesentery and the other is in the left deep pelvis. Could not exclude recurrent disease. 2. Stable advanced cirrhotic changes involving the liver with portal venous hypertension, portal venous collaterals, esophageal varices and splenomegaly. No worrisome hepatic lesions. 3. Diffuse wall thickening of the colon could  suggest diffuse colitis or could be due to low albumin. Recommend correlation with any symptoms such as diarrhea.   05/21/2018 Tumor Marker   Patient's tumor was tested for the following markers: CA125 Results of the tumor marker test revealed 86   09/07/2018 Tumor Marker   Patient's tumor was tested for the following markers:CA-125 Results of the tumor marker test revealed 111   09/29/2018 Tumor Marker   Patient's tumor was tested for the following markers: CA-125 Results of the tumor marker test revealed 121.   09/29/2018 - 11/24/2018 Chemotherapy   The patient had carboplatin and gemzar for chemotherapy treatment.  Chemo was stopped due to allergic reaction to carboplatin   10/27/2018 Tumor Marker   Patient's tumor was tested for the following markers: CA-125 Results of the tumor marker test revealed 99.9   11/24/2018 Tumor Marker   Patient's tumor was tested for the following markers: CA-125 Results of the tumor marker test revealed 81.2   12/14/2018 Tumor Marker   Patient's tumor was tested for the following markers: CA-125 Results of the tumor marker test revealed 81.2   12/14/2018 Imaging   1. Mild improvement in peritoneal disease. Peritoneal nodule within the left posterior pelvis contiguous with the left side of vaginal cuff is slightly decreased in size in the interval. Within the right iliac fossa there is a small peritoneal nodule which has decreased in size in the interval. Central small bowel mesenteric lymph node is not significantly changed. Slight decrease in size left periaortic lymph node.    2. Morphologic features of the liver compatible with cirrhosis. Splenomegaly is noted which may reflect portal venous hypertension.   12/21/2018 Tumor Marker   Patient's tumor was tested for the following markers: CA-125 Results of the tumor marker test revealed 64.2   12/21/2018 -  Chemotherapy   The patient had taxotere for chemotherapy treatment.     03/02/2019 Tumor Marker    Patient's tumor was tested for the following markers: CA-125. 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   No new or progressive metastatic disease within the abdomen or pelvis.   Hepatic cirrhosis and findings of portal venous hypertension. 1.1 cm low-attenuation lesion within left hepatic lobe, which could represent a regenerative nodule or small hepatocellular carcinoma. Recommend abdomen MRI without and with contrast for further characterization.     04/13/2019 Tumor Marker   Patient's tumor was tested for the following markers: CA-125 Results of the tumor marker test revealed 36.8   05/26/2019 Tumor Marker   Patient's tumor was tested for the following markers: CA-125 Results of the tumor marker  test revealed 31.6.   06/15/2019 Tumor Marker   Patient's tumor was tested for the following markers: CA-125 Results of the tumor marker test revealed 25.4   07/05/2019 Imaging   1. Stable CT examination. Stable peritoneal thickening in the pelvis and paracolic gutters. Stable solid left deep pelvic implant. Stable trace ascites. No new or progressive metastatic disease. 2. Stable cirrhosis. No discrete liver masses. 3. Stable mild-to-moderate splenomegaly. 4. Aortic Atherosclerosis (ICD10-I70.0).   07/27/2019 Tumor Marker   Patient's tumor was tested for the following markers: CA-125 Results of the tumor marker test revealed 35.4     REVIEW OF SYSTEMS:   Constitutional: Denies fevers, chills or abnormal weight loss Eyes: Denies blurriness of vision Ears, nose, mouth, throat, and face: Denies mucositis or sore throat Respiratory: Denies cough, dyspnea or wheezes Cardiovascular: Denies palpitation, chest discomfort or lower extremity swelling Gastrointestinal:  Denies nausea, heartburn or change in bowel habits Skin: Denies abnormal skin rashes Lymphatics: Denies new lymphadenopathy or  easy bruising Behavioral/Psych: Mood is stable, no new changes  All other systems were reviewed with the patient and are negative.  I have reviewed the past medical history, past surgical history, social history and family history with the patient and they are unchanged from previous note.  ALLERGIES:  is allergic to carboplatin, shellfish allergy, vancomycin, benadryl [diphenhydramine hcl], and penicillins.  MEDICATIONS:  Current Outpatient Medications  Medication Sig Dispense Refill  . cholecalciferol (VITAMIN D) 1000 units tablet Take 1,000 Units by mouth at bedtime.     Marland Kitchen dexamethasone (DECADRON) 4 MG tablet Start the day before Taxotere, take 1 tablet in the morning, and then 1 tablet daily after chemo for 2 days. Pls dispense 30 tabs (Patient taking differently: Take 4 mg by mouth See admin instructions. Start the day before Taxotere, take 1 tablet in the morning, and then 1 tablet daily after chemo for 2 days. Pls dispense 30 tabs) 30 tablet 1  . gabapentin (NEURONTIN) 300 MG capsule Take 3 capsules (900 mg total) by mouth 2 (two) times daily. 120 capsule 11  . HUMALOG MIX 75/25 KWIKPEN (75-25) 100 UNIT/ML Kwikpen Inject 10 Units into the skin 2 (two) times daily. Before breakfast & before supper Except when taking Chemo take 15 units  For five days    . HYDROcodone-acetaminophen (NORCO/VICODIN) 5-325 MG tablet Take 1 tablet by mouth every 6 (six) hours as needed for moderate pain. 60 tablet 0  . lidocaine-prilocaine (EMLA) cream Apply to Porta-cath  1-2 hours prior to access as directed. (Patient taking differently: Apply 1 application topically daily as needed (port access). ) 30 g 9  . nadolol (CORGARD) 20 MG tablet Take 1 tablet (20 mg total) by mouth daily. (Patient taking differently: Take 20 mg by mouth at bedtime. ) 90 tablet 3  . ondansetron (ZOFRAN) 8 MG tablet Take 1 tablet (8 mg total) by mouth every 8 (eight) hours as needed. Start on the third day after chemotherapy. (Patient  taking differently: Take 8 mg by mouth every 8 (eight) hours as needed for nausea. Start on the third day after chemotherapy.) 30 tablet 1  . pantoprazole (PROTONIX) 40 MG tablet Take 1 tablet (40 mg total) by mouth daily.    Marland Kitchen PROAIR HFA 108 (90 Base) MCG/ACT inhaler Inhale 2 puffs into the lungs every 4 (four) hours as needed for wheezing or shortness of breath.     . prochlorperazine (COMPAZINE) 10 MG tablet Take 1 tablet (10 mg total) by mouth every 6 (six) hours as  needed (Nausea or vomiting). 30 tablet 1  . rotigotine (NEUPRO) 4 MG/24HR Place 1 patch onto the skin daily. 30 patch 5   No current facility-administered medications for this visit.    PHYSICAL EXAMINATION: ECOG PERFORMANCE STATUS: 1 - Symptomatic but completely ambulatory  Vitals:   08/17/19 1020  BP: (!) 144/70  Pulse: 76  Resp: 18  Temp: 97.8 F (36.6 C)  SpO2: 100%   Filed Weights   08/17/19 1020  Weight: 200 lb 12.8 oz (91.1 kg)    GENERAL:alert, no distress and comfortable SKIN: skin color, texture, turgor are normal, no rashes or significant lesions EYES: normal, Conjunctiva are pink and non-injected, sclera clear OROPHARYNX:no exudate, no erythema and lips, buccal mucosa, and tongue normal  NECK: supple, thyroid normal size, non-tender, without nodularity LYMPH:  no palpable lymphadenopathy in the cervical, axillary or inguinal LUNGS: clear to auscultation and percussion with normal breathing effort HEART: regular rate & rhythm and no murmurs and no lower extremity edema ABDOMEN:abdomen soft, non-tender and normal bowel sounds Musculoskeletal:no cyanosis of digits and no clubbing  NEURO: alert & oriented x 3 with fluent speech, no focal motor/sensory deficits  LABORATORY DATA:  I have reviewed the data as listed    Component Value Date/Time   NA 141 08/17/2019 1000   NA 142 02/06/2017 0742   K 3.9 08/17/2019 1000   K 3.9 02/06/2017 0742   CL 111 08/17/2019 1000   CO2 22 08/17/2019 1000   CO2 22  02/06/2017 0742   GLUCOSE 175 (H) 08/17/2019 1000   GLUCOSE 156 (H) 02/06/2017 0742   BUN 19 08/17/2019 1000   BUN 13.5 02/06/2017 0742   CREATININE 0.84 08/17/2019 1000   CREATININE 0.8 02/06/2017 0742   CALCIUM 8.5 (L) 08/17/2019 1000   CALCIUM 8.5 02/06/2017 0742   PROT 6.8 08/17/2019 1000   PROT 6.9 02/06/2017 0742   ALBUMIN 3.7 08/17/2019 1000   ALBUMIN 3.7 02/06/2017 0742   AST 22 08/17/2019 1000   AST 47 (H) 02/06/2017 0742   ALT 28 08/17/2019 1000   ALT 54 02/06/2017 0742   ALKPHOS 121 08/17/2019 1000   ALKPHOS 130 02/06/2017 0742   BILITOT 0.6 08/17/2019 1000   BILITOT 0.65 02/06/2017 0742   GFRNONAA >60 08/17/2019 1000   GFRAA >60 08/17/2019 1000    No results found for: SPEP, UPEP  Lab Results  Component Value Date   WBC 7.3 08/17/2019   NEUTROABS 5.9 08/17/2019   HGB 10.3 (L) 08/17/2019   HCT 32.2 (L) 08/17/2019   MCV 97.6 08/17/2019   PLT 91 (L) 08/17/2019      Chemistry      Component Value Date/Time   NA 141 08/17/2019 1000   NA 142 02/06/2017 0742   K 3.9 08/17/2019 1000   K 3.9 02/06/2017 0742   CL 111 08/17/2019 1000   CO2 22 08/17/2019 1000   CO2 22 02/06/2017 0742   BUN 19 08/17/2019 1000   BUN 13.5 02/06/2017 0742   CREATININE 0.84 08/17/2019 1000   CREATININE 0.8 02/06/2017 0742      Component Value Date/Time   CALCIUM 8.5 (L) 08/17/2019 1000   CALCIUM 8.5 02/06/2017 0742   ALKPHOS 121 08/17/2019 1000   ALKPHOS 130 02/06/2017 0742   AST 22 08/17/2019 1000   AST 47 (H) 02/06/2017 0742   ALT 28 08/17/2019 1000   ALT 54 02/06/2017 0742   BILITOT 0.6 08/17/2019 1000   BILITOT 0.65 02/06/2017 0742

## 2019-08-17 NOTE — Telephone Encounter (Signed)
Called and given below message. She verbalized understanding. 

## 2019-08-17 NOTE — Assessment & Plan Note (Signed)
She is taking chronic hydrocodone for chronic back pain I refill her prescription today We discussed narcotic refill policy

## 2019-08-17 NOTE — Assessment & Plan Note (Signed)
She has multifactorial pancytopenia, due to recent chemotherapy, liver disease with splenomegaly as well as history of iron deficiency She does not need treatment for thrombocytopenia

## 2019-08-18 LAB — CA 125: Cancer Antigen (CA) 125: 35.7 U/mL (ref 0.0–38.1)

## 2019-09-07 ENCOUNTER — Inpatient Hospital Stay: Payer: Medicare Other

## 2019-09-07 ENCOUNTER — Encounter: Payer: Self-pay | Admitting: Hematology and Oncology

## 2019-09-07 ENCOUNTER — Inpatient Hospital Stay (HOSPITAL_BASED_OUTPATIENT_CLINIC_OR_DEPARTMENT_OTHER): Payer: Medicare Other | Admitting: Hematology and Oncology

## 2019-09-07 ENCOUNTER — Other Ambulatory Visit: Payer: Self-pay

## 2019-09-07 DIAGNOSIS — Z7189 Other specified counseling: Secondary | ICD-10-CM

## 2019-09-07 DIAGNOSIS — C569 Malignant neoplasm of unspecified ovary: Secondary | ICD-10-CM

## 2019-09-07 DIAGNOSIS — C482 Malignant neoplasm of peritoneum, unspecified: Secondary | ICD-10-CM

## 2019-09-07 DIAGNOSIS — E1140 Type 2 diabetes mellitus with diabetic neuropathy, unspecified: Secondary | ICD-10-CM | POA: Diagnosis not present

## 2019-09-07 DIAGNOSIS — D696 Thrombocytopenia, unspecified: Secondary | ICD-10-CM

## 2019-09-07 DIAGNOSIS — Z5111 Encounter for antineoplastic chemotherapy: Secondary | ICD-10-CM | POA: Diagnosis not present

## 2019-09-07 DIAGNOSIS — D509 Iron deficiency anemia, unspecified: Secondary | ICD-10-CM

## 2019-09-07 DIAGNOSIS — Z794 Long term (current) use of insulin: Secondary | ICD-10-CM

## 2019-09-07 LAB — CBC WITH DIFFERENTIAL (CANCER CENTER ONLY)
Abs Immature Granulocytes: 0.04 10*3/uL (ref 0.00–0.07)
Basophils Absolute: 0 10*3/uL (ref 0.0–0.1)
Basophils Relative: 0 %
Eosinophils Absolute: 0 10*3/uL (ref 0.0–0.5)
Eosinophils Relative: 0 %
HCT: 31.7 % — ABNORMAL LOW (ref 36.0–46.0)
Hemoglobin: 10.3 g/dL — ABNORMAL LOW (ref 12.0–15.0)
Immature Granulocytes: 1 %
Lymphocytes Relative: 10 %
Lymphs Abs: 0.7 10*3/uL (ref 0.7–4.0)
MCH: 31.4 pg (ref 26.0–34.0)
MCHC: 32.5 g/dL (ref 30.0–36.0)
MCV: 96.6 fL (ref 80.0–100.0)
Monocytes Absolute: 0.5 10*3/uL (ref 0.1–1.0)
Monocytes Relative: 7 %
Neutro Abs: 5.7 10*3/uL (ref 1.7–7.7)
Neutrophils Relative %: 82 %
Platelet Count: 94 10*3/uL — ABNORMAL LOW (ref 150–400)
RBC: 3.28 MIL/uL — ABNORMAL LOW (ref 3.87–5.11)
RDW: 17 % — ABNORMAL HIGH (ref 11.5–15.5)
WBC Count: 6.9 10*3/uL (ref 4.0–10.5)
nRBC: 0 % (ref 0.0–0.2)

## 2019-09-07 LAB — CMP (CANCER CENTER ONLY)
ALT: 23 U/L (ref 0–44)
AST: 20 U/L (ref 15–41)
Albumin: 3.7 g/dL (ref 3.5–5.0)
Alkaline Phosphatase: 118 U/L (ref 38–126)
Anion gap: 9 (ref 5–15)
BUN: 16 mg/dL (ref 8–23)
CO2: 22 mmol/L (ref 22–32)
Calcium: 8.8 mg/dL — ABNORMAL LOW (ref 8.9–10.3)
Chloride: 111 mmol/L (ref 98–111)
Creatinine: 0.88 mg/dL (ref 0.44–1.00)
GFR, Est AFR Am: >60 mL/min (ref >60)
GFR, Estimated: >60 mL/min (ref >60)
Glucose, Bld: 234 mg/dL — ABNORMAL HIGH (ref 70–99)
Potassium: 3.8 mmol/L (ref 3.5–5.1)
Sodium: 142 mmol/L (ref 135–145)
Total Bilirubin: 0.7 mg/dL (ref 0.3–1.2)
Total Protein: 6.6 g/dL (ref 6.5–8.1)

## 2019-09-07 LAB — IRON AND TIBC
Iron: 65 ug/dL (ref 41–142)
Saturation Ratios: 19 % — ABNORMAL LOW (ref 21–57)
TIBC: 344 ug/dL (ref 236–444)
UIBC: 279 ug/dL (ref 120–384)

## 2019-09-07 LAB — FERRITIN: Ferritin: 63 ng/mL (ref 11–307)

## 2019-09-07 MED ORDER — SODIUM CHLORIDE 0.9% FLUSH
10.0000 mL | INTRAVENOUS | Status: DC | PRN
  Administered 2019-09-07: 10 mL
  Filled 2019-09-07: qty 10

## 2019-09-07 MED ORDER — SODIUM CHLORIDE 0.9 % IV SOLN
Freq: Once | INTRAVENOUS | Status: AC
  Filled 2019-09-07: qty 250

## 2019-09-07 MED ORDER — SODIUM CHLORIDE 0.9% FLUSH
10.0000 mL | Freq: Once | INTRAVENOUS | Status: AC
  Administered 2019-09-07: 10 mL
  Filled 2019-09-07: qty 10

## 2019-09-07 MED ORDER — DEXAMETHASONE SODIUM PHOSPHATE 10 MG/ML IJ SOLN
5.0000 mg | Freq: Once | INTRAMUSCULAR | Status: AC
  Administered 2019-09-07: 5 mg via INTRAVENOUS

## 2019-09-07 MED ORDER — SODIUM CHLORIDE 0.9 % IV SOLN
37.5000 mg/m2 | Freq: Once | INTRAVENOUS | Status: AC
  Administered 2019-09-07: 70 mg via INTRAVENOUS
  Filled 2019-09-07: qty 7

## 2019-09-07 MED ORDER — PEGFILGRASTIM 6 MG/0.6ML ~~LOC~~ PSKT
6.0000 mg | PREFILLED_SYRINGE | Freq: Once | SUBCUTANEOUS | Status: AC
  Administered 2019-09-07: 6 mg via SUBCUTANEOUS

## 2019-09-07 MED ORDER — PEGFILGRASTIM 6 MG/0.6ML ~~LOC~~ PSKT
PREFILLED_SYRINGE | SUBCUTANEOUS | Status: AC
  Filled 2019-09-07: qty 0.6

## 2019-09-07 MED ORDER — DEXAMETHASONE SODIUM PHOSPHATE 10 MG/ML IJ SOLN
INTRAMUSCULAR | Status: AC
  Filled 2019-09-07: qty 1

## 2019-09-07 MED ORDER — HEPARIN SOD (PORK) LOCK FLUSH 100 UNIT/ML IV SOLN
500.0000 [IU] | Freq: Once | INTRAVENOUS | Status: AC | PRN
  Administered 2019-09-07: 500 [IU]
  Filled 2019-09-07: qty 5

## 2019-09-07 NOTE — Assessment & Plan Note (Signed)
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

## 2019-09-07 NOTE — Assessment & Plan Note (Signed)
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

## 2019-09-07 NOTE — Assessment & Plan Note (Signed)
Her blood sugar is elevated She will continue further management with primary care doctor/endocrinologist

## 2019-09-07 NOTE — Progress Notes (Signed)
[place] OFFICE PROGRESS NOTE  Patient Care Team: Gregor Hams, FNP as PCP - General (Family Medicine) Tat, Eustace Quail, DO as Consulting Physician (Neurology)  ASSESSMENT & PLAN:  Primary peritoneal carcinomatosis [facility]) Her tumor marker is improving and her last CT scan shows stable disease She tolerated chemotherapy fairly well except for mild pancytopenia and neuropathy We will proceed with treatment without delay We will continue treatment for few more months, her next imaging study will be due around August or September  Diabetes mellitus with diabetic neuropathy, with long-term current use of insulin ([place]) Her blood sugar is elevated She will continue further management with primary care doctor/endocrinologist  Thrombocytopenia ([place]) She has multifactorial pancytopenia, due to recent chemotherapy, liver disease with splenomegaly She does not need treatment for thrombocytopenia  Iron deficiency anemia She is known to have 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   All questions were answered. The patient knows to call the clinic with any problems, questions or concerns. The total time spent in the appointment was 20 minutes encounter with patients including review of chart and various tests results, discussions about plan of care and coordination of care plan   [name], MD 09/07/2019 11:00 AM  INTERVAL HISTORY: Please see below for problem oriented charting. She returns for chemotherapy and follow-up She denies recent side effects of therapy The patient denies any recent signs or symptoms of bleeding such as spontaneous epistaxis, hematuria or hematochezia. 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 Had allergic reaction to carboplatin. Avastin was stopped due to GI hemorrhage   Primary peritoneal carcinomatosis ([place])  09/06/2011 Imaging   CT findings consistent with cirrhotic changes involving the liver.  No worrisome liver mass.  There are associated portal venous collaterals and splenomegaly consistent with portal venous hypertension. 2.  No other significant upper abdominal findings   11/08/2013 Imaging   US showed ascites   11/08/2013 Initial Diagnosis   Patient had ~ 2 months of some urinary incontinence, saw Dr Ronita Hipps in 10-2013, with pelvic mass on exam    11/12/2013 Imaging   There are findings of progressive hepatic cirrhosis with a large amount of ascites and mild splenomegaly. There are venous collaterals present. 2. There is abnormal thickening of the peritoneal surface along the left mid and lower abdominal wall. There is abnormal soft tissue density in the pelvis as well which could reflect the clinically suspected ovarian malignancy but the findings are nondiagnostic. 3. There is a new small left pleural effusion. 4. There is no acute bowel abnormality.   11/18/2013 Imaging   Successful ultrasound guided diagnostic and therapeutic paracentesis yielding 4.1 liters of ascites.    11/18/2013 Pathology Results   PERITONEAL/ASCITIC FLUID (SPECIMEN 1 OF 1 COLLECTED 11/18/13): SEROUS CARCINOMA, PLEASE SEE COMMENT.   12/01/2013 Tumor Marker   Patient's tumor was tested for the following markers: CA125 Results of the tumor marker test revealed 1938   12/03/2013 Imaging   Successful ultrasound-guided therapeutic paracentesis yielding 3.6 liters of peritoneal fluid.   12/07/2013 - 05/30/2014 Chemotherapy   She received 3 cycles of carboplatin and Taxol, interrupted cycle 4 for surgery.  She subsequently completed 3 more cycles  of chemotherapy after surgery   12/20/2013 Imaging    Successful ultrasound-guided diagnostic and therapeutic paracentesis yielding 2.2 liters of peritoneal fluid   12/20/2013 Pathology Results   PERITONEAL/ASCITIC FLUID(SPECIMEN 1 OF 1 COLLECTED 12/20/13): MALIGNANT CELLS CONSISTENT WITH SEROUS CARCINOMA   01/13/2014 Tumor Marker   Patient's tumor was tested for the following markers: CA125 Results of the tumor marker test revealed 227   02/07/2014 Tumor Marker   Patient's tumor was tested for the following markers: CA125 Results of the tumor marker test revealed 130   02/15/2014 Genetic Testing   Genetics testing from 02-2014 normal ([employer] panel)   02/23/2014 Imaging   Interval decrease in ascites. 2. Morphologic changes in the liver consistent with cirrhosis. 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 Portal vein remains patent at this time. 3. Persistent but decreased abnormal soft tissue attenuation tracking in the omental fat. This may be secondary to interval improvement in metastatic disease. 4. Peritoneal thickening seen in the left abdomen and pelvis on the previous study has decreased and nearly resolved in the interval. This also suggests interval improvement in metastatic disease.    03/07/2014 Procedure   Ultrasound and fluoroscopically guided right internal jugular single lumen power port catheter insertion. Tip in the SVC/RA junction. Catheter ready for use.   03/17/2014 Tumor Marker   Patient's tumor was tested for the following markers: CA125 Results of the tumor marker test revealed 45   03/29/2014 Pathology Results   1. Omentum, resection for tumor - HIGH GRADE SEROUS CARCINOMA, SEE COMMENT. 2. Ovary and fallopian tube, right - HIGH GRADE SEROUS CARCINOMA, SEE COMMENT. 3. Ovary and fallopian tube, left - HIGH GRADE SEROUS CARCINOMA, SEE COMMENT. Diagnosis Note 1. Nests and clusters of malignant cells are invading the omental tissue (part #1) with associated fibrosis. The cells are  pleomorphic with prominent nucleoli. There are scattered psammoma bodies. The ovaries are atrophic and exhibit multiple foci of surface based invasive carcinoma and associated fibrosis. The fallopian tubes have a few foci of carcinoma, also superficially located. There are no precursor lesions noted in the ovaries or fallopian tubes (the entire tubes and ovaries were submitted for evaluation). While there is some retraction artifact, there are several foci suspicious for lymphovascular invasion. 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 Given the fibrosis, there does appear to be a small amount of treatment effect, however, there is abundant residual tumor.    03/29/2014 Surgery   Procedure(s) Performed: 1. Exploratory laparotomy with bilateral salpingo-oophorectomy, omentectomy radical tumor debulking for ovarian cancer .  Surgeon: Thereasa Solo, MD.  Assistant Surgeon: Lahoma Crocker, M.D. Assistant: (an MD assistant was necessary for tissue manipulation, retraction and positioning due to the complexity of the case and hospital policies).  Operative Findings: 10cm omental cake from hepatic to splenic flexure, densely adherent to transverse colon. Milliary studding of tumor implants (<75m, too numerous in number to count) adherent to the mesentery of the small bowel and small bowel wall. Small volume (100cc) ascites. Small ovaries bilaterally, densely adherent to the pelvic cul de sac. Left ovary and tube densely adherent to sigmoid colon. Cirrhotic liver with hepatomegaly and splenomegaly.     This represented an optimal cytoreduction (R1) with visible disease residual on the bowel wall and mesentery (millial, <338mimplants).     04/18/2014 Tumor Marker   Patient's tumor was tested for the following  markers: CA125 Results of the tumor marker test revealed 122    05/16/2014 Tumor Marker   Patient's tumor was tested for the following markers: CA125 Results of the tumor marker test revealed 30   06/10/2014 Tumor Marker   Patient's tumor was tested for the following markers: CA125 Results of the tumor marker test revealed 19   07/04/2014 Imaging   Interval improvement in the appearance of peritoneal metastasis secondary to ovarian cancer. 2. 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    08/18/2014 Tumor Marker   Patient's tumor was tested for the following markers: CA125 Results of the tumor marker test revealed 16   10/13/2014 Tumor Marker   Patient's tumor was tested for the following markers: CA125 Results of the tumor marker test revealed 14   11/21/2014 Tumor Marker   Patient's tumor was tested for the following markers: CA125 Results of the tumor marker test revealed 16   12/28/2014 Tumor Marker   Patient's tumor was tested for the following markers: CA125 Results of the tumor marker test revealed 762   02/27/2015 Tumor Marker   Patient's tumor was tested for the following markers: CA125 Results of the tumor marker test revealed 20.2   03/22/2015 Imaging   Filler appearance to the prior exam, with very subtle fluid tracking along portions of the liver, and very minimal nodularity along the right paracolic gutter representing residua from the prior peritoneal cancer. The current abnormalities could simply be post therapy findings rather than necessarily representing residual malignancy. No new or enlarging lesions are identified. 2. Hepatic cirrhosis and splenomegaly. There is some gastric varices suggesting portal venous hypertension. 3. Left foraminal stenosis at L4-5 due to spurring. There is likely also some central narrowing of the thecal sac at this level. 4. Bibasilar scarring. 5. Chronic mild left mid kidney scarring   03/27/2015 Tumor Marker   Patient's tumor was tested for the following markers: CA125 Results of the  tumor marker test revealed 25.3   04/24/2015 Imaging   Disc bulge L2-3 with mild spinal stenosis and right foraminal encroachment. 2. Disc bulge L3-4 with mild spinal stenosis. 3. Multifactorial spinal, left lateral recess and foraminal stenosis L4-5. There is grade 1 anterolisthesis without evident dynamic instability.   05/29/2015 Tumor Marker   Patient's tumor was tested for the following markers: CA125 Results of the tumor marker test revealed 29.9   08/21/2015 Tumor Marker   Patient's tumor was tested for the following markers: CA125 Results of the tumor marker test revealed 45   09/18/2015 Tumor Marker   Patient's tumor was tested for the following markers: CA125 Results of the tumor marker test revealed 47   09/27/2015 Imaging   New small bowel mesenteric nodule and enlarged left external iliac lymph node, highly worrisome for metastatic disease. 2. Cirrhosis and splenomegaly   10/10/2015 Tumor Marker   Patient's tumor was tested for the following markers: CA125 Results of the tumor marker test revealed 53.4   10/10/2015 - 12/11/2015 Chemotherapy   She received 4 cycles of carboplatin only   11/20/2015 Tumor Marker   Patient's tumor was tested for the following markers: CA125 Results of the tumor marker test revealed 41.3   12/20/2015 Imaging   CT: Mild left external iliac lymphadenopathy is slightly decreased. Mildly enlarged lower mesenteric nodule is slightly decreased. 2. No new or progressive metastatic disease in the abdomen or pelvis. 3. Cirrhosis. Stable subcentimeter hypodense left liver lobe lesion. No new liver lesions. 4. Stable mild splenomegaly.  No  ascites. 5. Mild sigmoid diverticulosis.   01/01/2016 -  Anti-estrogen oral therapy   She was placed on tamoxifen. Had letrozole initially but switched to tamoxifen due to poor tolerance   01/15/2016 Tumor Marker   Patient's tumor was tested for the following markers: CA125 Results of the tumor marker test revealed 31.4    02/19/2016 Tumor Marker   Patient's tumor was tested for the following markers: CA125 Results of the tumor marker test revealed 36.5   05/13/2016 Imaging   No evidence of disease progression within the abdomen or pelvis. Previously noted small central mesenteric nodule and left external iliac node appear slightly smaller. 2. Stable changes of hepatic cirrhosis and portal hypertension with associated splenomegaly. No new or enlarging hepatic lesions are identified. 3. No acute findings.   05/13/2016 Tumor Marker   Patient's tumor was tested for the following markers: CA125 Results of the tumor marker test revealed 41.2   06/24/2016 Tumor Marker   Patient's tumor was tested for the following markers: CA125 Results of the tumor marker test revealed 47.3   08/20/2016 Imaging   1. The left external iliac lymph node is slightly increased in size compared to the prior exam, currently 1.1 cm and previously 0.9 cm in diameter. 2. Stable central mesenteric lymph node at 0.9 cm in diameter. 3. Stable trace free pelvic fluid and stable slight thickening along the right paracolic gutter, without well-defined peritoneal nodularity. 4. Other imaging findings of potential clinical significance: Subsegmental atelectasis or scarring in the lung bases. Hepatic cirrhosis. Left renal scarring. Mild splenomegaly. Sigmoid colon diverticulosis. Impingement at L4-5 due to spondylosis and degenerative disc disease broad Schmorl' s nodes at L3-L4. Pelvic floor laxity   08/23/2016 Tumor Marker   Patient's tumor was tested for the following markers: CA125 Results of the tumor marker test revealed 80.2   09/04/2016 Imaging   LV EF: 60% -   65%   09/12/2016 Tumor Marker   Patient's tumor was tested for the following markers: CA125 Results of the tumor marker test revealed 98.5   09/12/2016 - 01/29/2018 Chemotherapy   The patient received Doxil and Avastin. Avastin was discontinued in Dec 2019 due to GI hemorrhage    09/26/2016 Tumor Marker   Patient's tumor was tested for the following markers: CA125 Results of the tumor marker test revealed 68.9   10/10/2016 Tumor Marker   Patient's tumor was tested for the following markers: CA125 Results of the tumor marker test revealed 65.6   11/07/2016 Tumor Marker   Patient's tumor was tested for the following markers: CA125 Results of the tumor marker test revealed 46.4   11/29/2016 Imaging   Stable mild peritoneal thickening, suspicious for peritoneal carcinomatosis. No new or progressive disease identified. No evidence of ascites.  No significant change in 10 mm left external iliac and 8 mm mesenteric lymph nodes.  Stable hepatic cirrhosis, and splenomegaly consistent with portal venous hypertension.  Colonic diverticulosis, without radiographic evidence of diverticulitis.   12/02/2016 Imaging   Normal LV size with EF 55%. Basal inferior and basal inferoseptal hypokinesis. Normal RV size and systolic function. No significant valvular abnormalities.   12/05/2016 Tumor Marker   Patient's tumor was tested for the following markers: CA125 Results of the tumor marker test revealed 49.1   01/09/2017 Tumor Marker   Patient's tumor was tested for the following markers: CA125 Results of the tumor marker test revealed 47.2   02/06/2017 Tumor Marker   Patient's tumor was tested for the following markers: CA125 Results of  the tumor marker test revealed 44.1   02/18/2017 Imaging   Stable mild peritoneal thickening. No new or progressive disease identified within the abdomen or pelvis.  Stable tiny sub-cm left external iliac and mesenteric lymph nodes.  Stable hepatic cirrhosis, and splenomegaly consistent with portal venous hypertension. No evidence of hepatic neoplasm.  Colonic diverticulosis, without radiographic evidence of diverticulitis.   03/06/2017 Tumor Marker   Patient's tumor was tested for the following markers: CA125 Results of the tumor  marker test revealed 50.4   03/17/2017 Imaging   - Left ventricle: The cavity size was normal. Wall thickness was normal. Systolic function was normal. The estimated ejection fraction was in the range of 55% to 60%. Wall motion was normal; there were no regional wall motion abnormalities. Features are consistent with a pseudonormal left ventricular filling pattern, with concomitant abnormal relaxation and increased filling pressure (grade 2 diastolic dysfunction). - Mitral valve: There was mild regurgitation. - Left atrium: The atrium was mildly dilated   04/03/2017 Tumor Marker   Patient's tumor was tested for the following markers: CA125 Results of the tumor marker test revealed 47.2   05/14/2017 Imaging   CT scan of abdomen and pelvis Stable mild peritoneal thickening. No new or progressive disease within the abdomen or pelvis.  Stable hepatic cirrhosis. Stable splenomegaly, consistent with portal venous hypertension. No evidence of hepatic neoplasm.   05/15/2017 Tumor Marker   Patient's tumor was tested for the following markers: CA125 Results of the tumor marker test revealed 56.1   06/12/2017 Tumor Marker   Patient's tumor was tested for the following markers: CA125 Results of the tumor marker test revealed 49.2   07/10/2017 Tumor Marker   Patient's tumor was tested for the following markers: CA125 Results of the tumor marker test revealed 46.3   08/07/2017 Tumor Marker   Patient's tumor was tested for the following markers: CA125 Results of the tumor marker test revealed 52.9   08/20/2017 Imaging   1. Mild omental/peritoneal haziness, unchanged. No evidence of new metastatic disease. 2. Cirrhosis with splenomegaly.   09/04/2017 Tumor Marker   Patient's tumor was tested for the following markers: CA125 Results of the tumor marker test revealed 48.5   11/13/2017 Tumor Marker   Patient's tumor was tested for the following markers: CA125 Results of the tumor marker test revealed  55.8   12/17/2017 Imaging   1. No findings to suggest metastatic disease in the abdomen or pelvis. 2. Severe hepatic cirrhosis with evidence of portal hypertension, as demonstrated by dilated portal vein, splenomegaly and portosystemic collateral pathways, including gastric and esophageal varices.  3. Colonic diverticulosis without evidence of acute diverticulitis at this time. 4. Additional incidental findings, as above.   01/16/2018 Tumor Marker   Patient's tumor was tested for the following markers: CA125 Results of the tumor marker test revealed 63.7   02/10/2018 Procedure   EGD - Recently bleeding grade III and large (> 5 mm) esophageal varices. Completely eradicated. Banded. - Portal hypertensive gastropathy. - Normal examined duodenum. - No specimens collected   02/13/2018 Tumor Marker   Patient's tumor was tested for the following markers: CA125 Results of the tumor marker test revealed 82.7   02/25/2018 Imaging   Bone density showed mild osteopenia   04/08/2018 Tumor Marker   Patient's tumor was tested for the following markers: CA125 Results of the tumor marker test revealed 82.7   04/08/2018 Imaging   1. No definite omental or peritoneal surface lesions. However, there are 2 slowly enlarging lymph  nodes noted. One is in the small bowel mesentery and the other is in the left deep pelvis. Could not exclude recurrent disease. 2. Stable advanced cirrhotic changes involving the liver with portal venous hypertension, portal venous collaterals, esophageal varices and splenomegaly. No worrisome hepatic lesions. 3. Diffuse wall thickening of the colon could suggest diffuse colitis or could be due to low albumin. Recommend correlation with any symptoms such as diarrhea.   05/21/2018 Tumor Marker   Patient's tumor was tested for the following markers: CA125 Results of the tumor marker test revealed 86   09/07/2018 Tumor Marker   Patient's tumor was tested for the following  markers:CA-125 Results of the tumor marker test revealed 111   09/29/2018 Tumor Marker   Patient's tumor was tested for the following markers: CA-125 Results of the tumor marker test revealed 121.   09/29/2018 - 11/24/2018 Chemotherapy   The patient had carboplatin and gemzar for chemotherapy treatment.  Chemo was stopped due to allergic reaction to carboplatin   10/27/2018 Tumor Marker   Patient's tumor was tested for the following markers: CA-125 Results of the tumor marker test revealed 99.9   11/24/2018 Tumor Marker   Patient's tumor was tested for the following markers: CA-125 Results of the tumor marker test revealed 81.2   12/14/2018 Tumor Marker   Patient's tumor was tested for the following markers: CA-125 Results of the tumor marker test revealed 81.2   12/14/2018 Imaging   1. Mild improvement in peritoneal disease. Peritoneal nodule within the left posterior pelvis contiguous with the left side of vaginal cuff is slightly decreased in size in the interval. Within the right iliac fossa there is a small peritoneal nodule which has decreased in size in the interval. Central small bowel mesenteric lymph node is not significantly changed. Slight decrease in size left periaortic lymph node.    2. Morphologic features of the liver compatible with cirrhosis. Splenomegaly is noted which may reflect portal venous hypertension.   12/21/2018 Tumor Marker   Patient's tumor was tested for the following markers: CA-125 Results of the tumor marker test revealed 64.2   12/21/2018 -  Chemotherapy   The patient had taxotere for chemotherapy treatment.     03/02/2019 Tumor Marker   Patient's tumor was tested for the following markers: CA-125. 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   No new or progressive metastatic disease within the abdomen or pelvis.   Hepatic cirrhosis and findings of  portal venous hypertension. 1.1 cm low-attenuation lesion within left hepatic lobe, which could represent a regenerative nodule or small hepatocellular carcinoma. Recommend abdomen MRI without and with contrast for further characterization.     04/13/2019 Tumor Marker   Patient's tumor was tested for the following markers: CA-125 Results of the tumor marker test revealed 36.8   05/26/2019 Tumor Marker   Patient's tumor was tested for the following markers: CA-125 Results of the tumor marker test revealed 31.6.   06/15/2019 Tumor Marker   Patient's tumor was tested for the following markers: CA-125 Results of the tumor marker test revealed 25.4   07/05/2019 Imaging   1. Stable CT examination. Stable peritoneal thickening in the pelvis and paracolic gutters. Stable solid left deep pelvic implant. Stable trace ascites. No new or progressive metastatic disease. 2. Stable cirrhosis. No discrete liver masses. 3. Stable mild-to-moderate splenomegaly. 4. Aortic Atherosclerosis (ICD10-I70.0).   07/27/2019 Tumor Marker   Patient's tumor  was tested for the following markers: CA-125 Results of the tumor marker test revealed 35.4   08/17/2019 Tumor Marker   Patient's tumor was tested for the following markers: Ca-125 Results of the tumor marker test revealed 35.7.     REVIEW OF SYSTEMS:   Constitutional: Denies fevers, chills or abnormal weight loss Eyes: Denies blurriness of vision Ears, nose, mouth, throat, and face: Denies mucositis or sore throat Respiratory: Denies cough, dyspnea or wheezes Cardiovascular: Denies palpitation, chest discomfort or lower extremity swelling Gastrointestinal:  Denies nausea, heartburn or change in bowel habits Skin: Denies abnormal skin rashes Lymphatics: Denies new lymphadenopathy or easy bruising Behavioral/Psych: Mood is stable, no new changes  All other systems were reviewed with the patient and are negative.  I have reviewed the past medical history, past  surgical history, social history and family history with the patient and they are unchanged from previous note.  ALLERGIES:  is allergic to carboplatin, shellfish allergy, vancomycin, benadryl [diphenhydramine hcl], and penicillins.  MEDICATIONS:  Current Outpatient Medications  Medication Sig Dispense Refill  . HUMALOG MIX 75/25 KWIKPEN (75-25) 100 UNIT/ML Kwikpen Inject 22 Units into the skin 2 (two) times daily. Before breakfast & before supper Except when taking Chemo take 32 units  For three to five days    . cholecalciferol (VITAMIN D) 1000 units tablet Take 1,000 Units by mouth at bedtime.     Marland Kitchen dexamethasone (DECADRON) 4 MG tablet Start the day before Taxotere, take 1 tablet in the morning, and then 1 tablet daily after chemo for 2 days. Pls dispense 30 tabs (Patient taking differently: Take 4 mg by mouth See admin instructions. Start the day before Taxotere, take 1 tablet in the morning, and then 1 tablet daily after chemo for 2 days. Pls dispense 30 tabs) 30 tablet 1  . gabapentin (NEURONTIN) 300 MG capsule Take 3 capsules (900 mg total) by mouth 2 (two) times daily. 120 capsule 11  . HYDROcodone-acetaminophen (NORCO/VICODIN) 5-325 MG tablet Take 1 tablet by mouth every 6 (six) hours as needed for moderate pain. 60 tablet 0  . lidocaine-prilocaine (EMLA) cream Apply to Porta-cath  1-2 hours prior to access as directed. (Patient taking differently: Apply 1 application topically daily as needed (port access). ) 30 g 9  . nadolol (CORGARD) 20 MG tablet Take 1 tablet (20 mg total) by mouth daily. (Patient taking differently: Take 20 mg by mouth at bedtime. ) 90 tablet 3  . ondansetron (ZOFRAN) 8 MG tablet Take 1 tablet (8 mg total) by mouth every 8 (eight) hours as needed. Start on the third day after chemotherapy. (Patient taking differently: Take 8 mg by mouth every 8 (eight) hours as needed for nausea. Start on the third day after chemotherapy.) 30 tablet 1  . pantoprazole (PROTONIX) 40 MG  tablet Take 1 tablet (40 mg total) by mouth daily.    Marland Kitchen PROAIR HFA 108 (90 Base) MCG/ACT inhaler Inhale 2 puffs into the lungs every 4 (four) hours as needed for wheezing or shortness of breath.     . prochlorperazine (COMPAZINE) 10 MG tablet Take 1 tablet (10 mg total) by mouth every 6 (six) hours as needed (Nausea or vomiting). 30 tablet 1  . rotigotine (NEUPRO) 4 MG/24HR Place 1 patch onto the skin daily. 30 patch 5   No current facility-administered medications for this visit.   Facility-Administered Medications Ordered in Other Visits  Medication Dose Route Frequency Provider Last Rate Last Admin  . DOCEtaxel (TAXOTERE) 70 mg in sodium chloride  0.9 % 150 mL chemo infusion  37.5 mg/m2 (Treatment Plan Recorded) Intravenous Once [name], MD 157 mL/hr at 09/07/19 1016 70 mg at 09/07/19 1016  . heparin lock flush 100 unit/mL  500 Units Intracatheter Once PRN Ka Bench, MD      . pegfilgrastim (NEULASTA ONPRO KIT) injection 6 mg  6 mg Subcutaneous Once Ariez Neilan, MD      . sodium chloride flush (NS) 0.9 % injection 10 mL  10 mL Intracatheter PRN Alvy Bimler, Favio Moder, MD        PHYSICAL EXAMINATION: ECOG PERFORMANCE STATUS: 1 - Symptomatic but completely ambulatory  Vitals:   09/07/19 0814  BP: (!) 152/64  Pulse: 82  Resp: 18  Temp: 98 F (36.7 C)  SpO2: 100%   Filed Weights   09/07/19 0814  Weight: 198 lb 3.2 oz (89.9 kg)    GENERAL:alert, no distress and comfortable SKIN: skin color, texture, turgor are normal, no rashes or significant lesions EYES: normal, Conjunctiva are pink and non-injected, sclera clear OROPHARYNX:no exudate, no erythema and lips, buccal mucosa, and tongue normal  NECK: supple, thyroid normal size, non-tender, without nodularity LYMPH:  no palpable lymphadenopathy in the cervical, axillary or inguinal LUNGS: clear to auscultation and percussion with normal breathing effort HEART: regular rate & rhythm and no murmurs and no lower extremity  edema ABDOMEN:abdomen soft, non-tender and normal bowel sounds Musculoskeletal:no cyanosis of digits and no clubbing  NEURO: alert & oriented x 3 with fluent speech, no focal motor/sensory deficits  LABORATORY DATA:  I have reviewed the data as listed    Component Value Date/Time   NA 142 09/07/2019 0752   NA 142 02/06/2017 0742   K 3.8 09/07/2019 0752   K 3.9 02/06/2017 0742   CL 111 09/07/2019 0752   CO2 22 09/07/2019 0752   CO2 22 02/06/2017 0742   GLUCOSE 234 (H) 09/07/2019 0752   GLUCOSE 156 (H) 02/06/2017 0742   BUN 16 09/07/2019 0752   BUN 13.5 02/06/2017 0742   CREATININE 0.88 09/07/2019 0752   CREATININE 0.8 02/06/2017 0742   CALCIUM 8.8 (L) 09/07/2019 0752   CALCIUM 8.5 02/06/2017 0742   PROT 6.6 09/07/2019 0752   PROT 6.9 02/06/2017 0742   ALBUMIN 3.7 09/07/2019 0752   ALBUMIN 3.7 02/06/2017 0742   AST 20 09/07/2019 0752   AST 47 (H) 02/06/2017 0742   ALT 23 09/07/2019 0752   ALT 54 02/06/2017 0742   ALKPHOS 118 09/07/2019 0752   ALKPHOS 130 02/06/2017 0742   BILITOT 0.7 09/07/2019 0752   BILITOT 0.65 02/06/2017 0742   GFRNONAA >60 09/07/2019 0752   GFRAA >60 09/07/2019 0752    No results found for: SPEP, UPEP  Lab Results  Component Value Date   WBC 6.9 09/07/2019   NEUTROABS 5.7 09/07/2019   HGB 10.3 (L) 09/07/2019   HCT 31.7 (L) 09/07/2019   MCV 96.6 09/07/2019   PLT 94 (L) 09/07/2019      Chemistry      Component Value Date/Time   NA 142 09/07/2019 0752   NA 142 02/06/2017 0742   K 3.8 09/07/2019 0752   K 3.9 02/06/2017 0742   CL 111 09/07/2019 0752   CO2 22 09/07/2019 0752   CO2 22 02/06/2017 0742   BUN 16 09/07/2019 0752   BUN 13.5 02/06/2017 0742   CREATININE 0.88 09/07/2019 0752   CREATININE 0.8 02/06/2017 0742      Component Value Date/Time   CALCIUM 8.8 (L) 09/07/2019 0752   CALCIUM  8.5 02/06/2017 0742   ALKPHOS 118 09/07/2019 0752   ALKPHOS 130 02/06/2017 0742   AST 20 09/07/2019 0752   AST 47 (H) 02/06/2017 0742   ALT 23  09/07/2019 0752   ALT 54 02/06/2017 0742   BILITOT 0.7 09/07/2019 0752   BILITOT 0.65 02/06/2017 0742

## 2019-09-07 NOTE — Assessment & Plan Note (Signed)
She has multifactorial pancytopenia, due to recent chemotherapy, liver disease with splenomegaly She does not need treatment for thrombocytopenia

## 2019-09-07 NOTE — Patient Instructions (Signed)
[facility] Discharge Instructions for Patients Receiving Chemotherapy  Today you received the following chemotherapy agents: docetaxel.  To help prevent nausea and vomiting after your treatment, we encourage you to take your nausea medication as directed.   If you develop nausea and vomiting that is not controlled by your nausea medication, call the clinic.   BELOW ARE SYMPTOMS THAT SHOULD BE REPORTED IMMEDIATELY:  *FEVER GREATER THAN 100.5 F  *CHILLS WITH OR WITHOUT FEVER  NAUSEA AND VOMITING THAT IS NOT CONTROLLED WITH YOUR NAUSEA MEDICATION  *UNUSUAL SHORTNESS OF BREATH  *UNUSUAL BRUISING OR BLEEDING  TENDERNESS IN MOUTH AND THROAT WITH OR WITHOUT PRESENCE OF ULCERS  *URINARY PROBLEMS  *BOWEL PROBLEMS  UNUSUAL RASH Items with * indicate a potential emergency and should be followed up as soon as possible.  Feel free to call the clinic should you have any questions or concerns. The clinic phone number is [phone].  Please show the [facility] at check-in to the Emergency Department and triage nurse.

## 2019-09-17 ENCOUNTER — Inpatient Hospital Stay (HOSPITAL_COMMUNITY)
Admission: EM | Admit: 2019-09-17 | Discharge: 2019-09-21 | DRG: 377 | Disposition: A | Discharge status: Home / Self Care | Payer: Medicare Other | Attending: Internal Medicine | Admitting: Internal Medicine

## 2019-09-17 ENCOUNTER — Inpatient Hospital Stay (HOSPITAL_COMMUNITY): Payer: Medicare Other | Admitting: Certified Registered Nurse Anesthetist

## 2019-09-17 ENCOUNTER — Encounter (HOSPITAL_COMMUNITY): Payer: Self-pay

## 2019-09-17 ENCOUNTER — Telehealth: Payer: Self-pay

## 2019-09-17 ENCOUNTER — Encounter (HOSPITAL_COMMUNITY)
Admission: EM | Disposition: A | Discharge status: Home / Self Care | Payer: Self-pay | Source: Home / Self Care | Attending: Internal Medicine

## 2019-09-17 ENCOUNTER — Other Ambulatory Visit: Payer: Self-pay

## 2019-09-17 DIAGNOSIS — E1140 Type 2 diabetes mellitus with diabetic neuropathy, unspecified: Secondary | ICD-10-CM

## 2019-09-17 DIAGNOSIS — K921 Melena: Secondary | ICD-10-CM | POA: Diagnosis not present

## 2019-09-17 DIAGNOSIS — M549 Dorsalgia, unspecified: Secondary | ICD-10-CM | POA: Diagnosis present

## 2019-09-17 DIAGNOSIS — G8929 Other chronic pain: Secondary | ICD-10-CM | POA: Diagnosis present

## 2019-09-17 DIAGNOSIS — K766 Portal hypertension: Secondary | ICD-10-CM | POA: Diagnosis not present

## 2019-09-17 DIAGNOSIS — I252 Old myocardial infarction: Secondary | ICD-10-CM

## 2019-09-17 DIAGNOSIS — D509 Iron deficiency anemia, unspecified: Secondary | ICD-10-CM | POA: Diagnosis present

## 2019-09-17 DIAGNOSIS — I8501 Esophageal varices with bleeding: Secondary | ICD-10-CM

## 2019-09-17 DIAGNOSIS — Z888 Allergy status to other drugs, medicaments and biological substances status: Secondary | ICD-10-CM

## 2019-09-17 DIAGNOSIS — Z87891 Personal history of nicotine dependence: Secondary | ICD-10-CM

## 2019-09-17 DIAGNOSIS — K7581 Nonalcoholic steatohepatitis (NASH): Secondary | ICD-10-CM | POA: Diagnosis present

## 2019-09-17 DIAGNOSIS — D62 Acute posthemorrhagic anemia: Secondary | ICD-10-CM | POA: Diagnosis not present

## 2019-09-17 DIAGNOSIS — D61818 Other pancytopenia: Secondary | ICD-10-CM | POA: Diagnosis not present

## 2019-09-17 DIAGNOSIS — I8500 Esophageal varices without bleeding: Secondary | ICD-10-CM | POA: Diagnosis not present

## 2019-09-17 DIAGNOSIS — T451X5A Adverse effect of antineoplastic and immunosuppressive drugs, initial encounter: Secondary | ICD-10-CM | POA: Diagnosis present

## 2019-09-17 DIAGNOSIS — R042 Hemoptysis: Secondary | ICD-10-CM | POA: Diagnosis present

## 2019-09-17 DIAGNOSIS — K920 Hematemesis: Principal | ICD-10-CM | POA: Diagnosis present

## 2019-09-17 DIAGNOSIS — K7460 Unspecified cirrhosis of liver: Secondary | ICD-10-CM | POA: Diagnosis present

## 2019-09-17 DIAGNOSIS — Z881 Allergy status to other antibiotic agents status: Secondary | ICD-10-CM

## 2019-09-17 DIAGNOSIS — Z20822 Contact with and (suspected) exposure to covid-19: Secondary | ICD-10-CM | POA: Diagnosis present

## 2019-09-17 DIAGNOSIS — I5032 Chronic diastolic (congestive) heart failure: Secondary | ICD-10-CM

## 2019-09-17 DIAGNOSIS — I10 Essential (primary) hypertension: Secondary | ICD-10-CM | POA: Diagnosis present

## 2019-09-17 DIAGNOSIS — K3189 Other diseases of stomach and duodenum: Secondary | ICD-10-CM | POA: Diagnosis present

## 2019-09-17 DIAGNOSIS — Z7952 Long term (current) use of systemic steroids: Secondary | ICD-10-CM

## 2019-09-17 DIAGNOSIS — D689 Coagulation defect, unspecified: Secondary | ICD-10-CM | POA: Diagnosis not present

## 2019-09-17 DIAGNOSIS — Z8052 Family history of malignant neoplasm of bladder: Secondary | ICD-10-CM

## 2019-09-17 DIAGNOSIS — Z794 Long term (current) use of insulin: Secondary | ICD-10-CM

## 2019-09-17 DIAGNOSIS — R161 Splenomegaly, not elsewhere classified: Secondary | ICD-10-CM | POA: Diagnosis present

## 2019-09-17 DIAGNOSIS — C786 Secondary malignant neoplasm of retroperitoneum and peritoneum: Secondary | ICD-10-CM | POA: Diagnosis present

## 2019-09-17 DIAGNOSIS — Z825 Family history of asthma and other chronic lower respiratory diseases: Secondary | ICD-10-CM

## 2019-09-17 DIAGNOSIS — E1165 Type 2 diabetes mellitus with hyperglycemia: Secondary | ICD-10-CM | POA: Diagnosis present

## 2019-09-17 DIAGNOSIS — C569 Malignant neoplasm of unspecified ovary: Secondary | ICD-10-CM | POA: Diagnosis not present

## 2019-09-17 DIAGNOSIS — Z8249 Family history of ischemic heart disease and other diseases of the circulatory system: Secondary | ICD-10-CM

## 2019-09-17 DIAGNOSIS — Z8261 Family history of arthritis: Secondary | ICD-10-CM

## 2019-09-17 DIAGNOSIS — E669 Obesity, unspecified: Secondary | ICD-10-CM | POA: Diagnosis present

## 2019-09-17 DIAGNOSIS — D61810 Antineoplastic chemotherapy induced pancytopenia: Secondary | ICD-10-CM | POA: Diagnosis present

## 2019-09-17 DIAGNOSIS — D72829 Elevated white blood cell count, unspecified: Secondary | ICD-10-CM | POA: Diagnosis present

## 2019-09-17 DIAGNOSIS — Z823 Family history of stroke: Secondary | ICD-10-CM

## 2019-09-17 DIAGNOSIS — K760 Fatty (change of) liver, not elsewhere classified: Secondary | ICD-10-CM | POA: Diagnosis present

## 2019-09-17 DIAGNOSIS — K922 Gastrointestinal hemorrhage, unspecified: Secondary | ICD-10-CM | POA: Diagnosis present

## 2019-09-17 DIAGNOSIS — D649 Anemia, unspecified: Secondary | ICD-10-CM

## 2019-09-17 DIAGNOSIS — Z79899 Other long term (current) drug therapy: Secondary | ICD-10-CM

## 2019-09-17 DIAGNOSIS — D6959 Other secondary thrombocytopenia: Secondary | ICD-10-CM | POA: Diagnosis present

## 2019-09-17 DIAGNOSIS — Z803 Family history of malignant neoplasm of breast: Secondary | ICD-10-CM

## 2019-09-17 DIAGNOSIS — Z833 Family history of diabetes mellitus: Secondary | ICD-10-CM

## 2019-09-17 DIAGNOSIS — Z85828 Personal history of other malignant neoplasm of skin: Secondary | ICD-10-CM

## 2019-09-17 DIAGNOSIS — Z880 Allergy status to penicillin: Secondary | ICD-10-CM

## 2019-09-17 DIAGNOSIS — E119 Type 2 diabetes mellitus without complications: Secondary | ICD-10-CM

## 2019-09-17 DIAGNOSIS — C482 Malignant neoplasm of peritoneum, unspecified: Secondary | ICD-10-CM | POA: Diagnosis present

## 2019-09-17 DIAGNOSIS — Z6835 Body mass index (BMI) 35.0-35.9, adult: Secondary | ICD-10-CM

## 2019-09-17 HISTORY — PX: ESOPHAGOGASTRODUODENOSCOPY: SHX5428

## 2019-09-17 LAB — GLUCOSE, CAPILLARY
Glucose-Capillary: 215 mg/dL — ABNORMAL HIGH (ref 70–99)
Glucose-Capillary: 250 mg/dL — ABNORMAL HIGH (ref 70–99)
Glucose-Capillary: 256 mg/dL — ABNORMAL HIGH (ref 70–99)
Glucose-Capillary: 310 mg/dL — ABNORMAL HIGH (ref 70–99)
Glucose-Capillary: 318 mg/dL — ABNORMAL HIGH (ref 70–99)

## 2019-09-17 LAB — PROTIME-INR
INR: 1.4 — ABNORMAL HIGH (ref 0.8–1.2)
Prothrombin Time: 17 seconds — ABNORMAL HIGH (ref 11.4–15.2)

## 2019-09-17 LAB — CBC WITH DIFFERENTIAL/PLATELET
Abs Immature Granulocytes: 2.1 10*3/uL — ABNORMAL HIGH (ref 0.00–0.07)
Basophils Absolute: 0.1 10*3/uL (ref 0.0–0.1)
Basophils Relative: 0 %
Eosinophils Absolute: 0 10*3/uL (ref 0.0–0.5)
Eosinophils Relative: 0 %
HCT: 23 % — ABNORMAL LOW (ref 36.0–46.0)
Hemoglobin: 7.2 g/dL — ABNORMAL LOW (ref 12.0–15.0)
Immature Granulocytes: 7 %
Lymphocytes Relative: 8 %
Lymphs Abs: 2.2 10*3/uL (ref 0.7–4.0)
MCH: 31.2 pg (ref 26.0–34.0)
MCHC: 31.3 g/dL (ref 30.0–36.0)
MCV: 99.6 fL (ref 80.0–100.0)
Monocytes Absolute: 1.1 10*3/uL — ABNORMAL HIGH (ref 0.1–1.0)
Monocytes Relative: 4 %
Neutro Abs: 23.4 10*3/uL — ABNORMAL HIGH (ref 1.7–7.7)
Neutrophils Relative %: 81 %
Platelets: 135 10*3/uL — ABNORMAL LOW (ref 150–400)
RBC: 2.31 MIL/uL — ABNORMAL LOW (ref 3.87–5.11)
RDW: 18 % — ABNORMAL HIGH (ref 11.5–15.5)
WBC: 28.9 10*3/uL — ABNORMAL HIGH (ref 4.0–10.5)
nRBC: 0.4 % — ABNORMAL HIGH (ref 0.0–0.2)

## 2019-09-17 LAB — COMPREHENSIVE METABOLIC PANEL
ALT: 24 U/L (ref 0–44)
AST: 22 U/L (ref 15–41)
Albumin: 3.3 g/dL — ABNORMAL LOW (ref 3.5–5.0)
Alkaline Phosphatase: 139 U/L — ABNORMAL HIGH (ref 38–126)
Anion gap: 9 (ref 5–15)
BUN: 51 mg/dL — ABNORMAL HIGH (ref 8–23)
CO2: 21 mmol/L — ABNORMAL LOW (ref 22–32)
Calcium: 8 mg/dL — ABNORMAL LOW (ref 8.9–10.3)
Chloride: 106 mmol/L (ref 98–111)
Creatinine, Ser: 0.99 mg/dL (ref 0.44–1.00)
GFR calc Af Amer: >60 mL/min (ref >60)
GFR calc non Af Amer: 57 mL/min — ABNORMAL LOW (ref >60)
Glucose, Bld: 314 mg/dL — ABNORMAL HIGH (ref 70–99)
Potassium: 4.8 mmol/L (ref 3.5–5.1)
Sodium: 136 mmol/L (ref 135–145)
Total Bilirubin: 0.9 mg/dL (ref 0.3–1.2)
Total Protein: 5.4 g/dL — ABNORMAL LOW (ref 6.5–8.1)

## 2019-09-17 LAB — SAMPLE TO BLOOD BANK

## 2019-09-17 LAB — SARS CORONAVIRUS 2 BY RT PCR (HOSPITAL ORDER, PERFORMED IN ~~LOC~~ HOSPITAL LAB): SARS Coronavirus 2: NEGATIVE

## 2019-09-17 LAB — CBG MONITORING, ED
Glucose-Capillary: 299 mg/dL — ABNORMAL HIGH (ref 70–99)
Glucose-Capillary: 287 mg/dL — ABNORMAL HIGH (ref 70–99)

## 2019-09-17 LAB — PREPARE RBC (CROSSMATCH)

## 2019-09-17 LAB — APTT: aPTT: 41 seconds — ABNORMAL HIGH (ref 24–36)

## 2019-09-17 SURGERY — EGD (ESOPHAGOGASTRODUODENOSCOPY)
Anesthesia: General

## 2019-09-17 MED ORDER — SODIUM CHLORIDE 0.9 % IV SOLN
2.0000 g | INTRAVENOUS | Status: DC
  Administered 2019-09-17 – 2019-09-21 (×5): 2 g via INTRAVENOUS
  Filled 2019-09-17: qty 20
  Filled 2019-09-17: qty 2
  Filled 2019-09-17 (×3): qty 20

## 2019-09-17 MED ORDER — LACTATED RINGERS IV SOLN
INTRAVENOUS | Status: DC | PRN
Start: 2019-09-17 — End: 2019-09-17

## 2019-09-17 MED ORDER — MORPHINE SULFATE (PF) 2 MG/ML IV SOLN
2.0000 mg | INTRAVENOUS | Status: DC | PRN
  Administered 2019-09-18: 2 mg via INTRAVENOUS
  Filled 2019-09-17: qty 1

## 2019-09-17 MED ORDER — SODIUM CHLORIDE 0.9 % IV SOLN
8.0000 mg/h | INTRAVENOUS | Status: AC
  Administered 2019-09-17 – 2019-09-19 (×7): 8 mg/h via INTRAVENOUS
  Filled 2019-09-17 (×9): qty 80

## 2019-09-17 MED ORDER — ONDANSETRON HCL 4 MG/2ML IJ SOLN
4.0000 mg | Freq: Four times a day (QID) | INTRAMUSCULAR | Status: DC | PRN
  Administered 2019-09-18 (×2): 4 mg via INTRAVENOUS
  Filled 2019-09-17 (×2): qty 2

## 2019-09-17 MED ORDER — SODIUM CHLORIDE 0.9 % IV SOLN: INTRAVENOUS | Status: DC

## 2019-09-17 MED ORDER — FENTANYL CITRATE (PF) 100 MCG/2ML IJ SOLN
INTRAMUSCULAR | Status: AC
  Filled 2019-09-17: qty 2

## 2019-09-17 MED ORDER — SODIUM CHLORIDE 0.9 % IV SOLN
50.0000 ug/h | INTRAVENOUS | Status: DC
  Administered 2019-09-17 – 2019-09-19 (×6): 50 ug/h via INTRAVENOUS
  Filled 2019-09-17 (×9): qty 1

## 2019-09-17 MED ORDER — CHLORHEXIDINE GLUCONATE 0.12 % MT SOLN
OROMUCOSAL | Status: AC
  Filled 2019-09-17: qty 15

## 2019-09-17 MED ORDER — SODIUM CHLORIDE 0.9% IV SOLUTION: Freq: Once | INTRAVENOUS | Status: AC

## 2019-09-17 MED ORDER — INSULIN GLARGINE 100 UNIT/ML ~~LOC~~ SOLN
10.0000 [IU] | Freq: Every day | SUBCUTANEOUS | Status: DC
  Filled 2019-09-17 (×2): qty 0.1

## 2019-09-17 MED ORDER — NADOLOL 20 MG PO TABS
20.0000 mg | ORAL_TABLET | Freq: Every day | ORAL | Status: DC
  Administered 2019-09-18 – 2019-09-20 (×3): 20 mg via ORAL
  Filled 2019-09-17 (×4): qty 1

## 2019-09-17 MED ORDER — SODIUM CHLORIDE 0.9 % IV BOLUS
500.0000 mL | Freq: Once | INTRAVENOUS | Status: AC
  Administered 2019-09-17: 500 mL via INTRAVENOUS

## 2019-09-17 MED ORDER — FENTANYL CITRATE (PF) 100 MCG/2ML IJ SOLN
INTRAMUSCULAR | Status: DC | PRN
  Administered 2019-09-17: 50 ug via INTRAVENOUS

## 2019-09-17 MED ORDER — GABAPENTIN 300 MG PO CAPS
900.0000 mg | ORAL_CAPSULE | Freq: Two times a day (BID) | ORAL | Status: DC
  Administered 2019-09-17 – 2019-09-21 (×8): 900 mg via ORAL
  Filled 2019-09-17 (×8): qty 3

## 2019-09-17 MED ORDER — ONDANSETRON HCL 4 MG/2ML IJ SOLN
INTRAMUSCULAR | Status: DC | PRN
  Administered 2019-09-17: 4 mg via INTRAVENOUS

## 2019-09-17 MED ORDER — CHLORHEXIDINE GLUCONATE CLOTH 2 % EX PADS
6.0000 | MEDICATED_PAD | Freq: Every day | CUTANEOUS | Status: DC
  Administered 2019-09-17 – 2019-09-20 (×3): 6 via TOPICAL

## 2019-09-17 MED ORDER — SUCCINYLCHOLINE CHLORIDE 200 MG/10ML IV SOSY
PREFILLED_SYRINGE | INTRAVENOUS | Status: DC | PRN
  Administered 2019-09-17: 100 mg via INTRAVENOUS

## 2019-09-17 MED ORDER — VITAMIN D 25 MCG (1000 UNIT) PO TABS
1000.0000 [IU] | ORAL_TABLET | Freq: Every day | ORAL | Status: DC
  Administered 2019-09-18 – 2019-09-20 (×3): 1000 [IU] via ORAL
  Filled 2019-09-17 (×3): qty 1

## 2019-09-17 MED ORDER — HYDROCODONE-ACETAMINOPHEN 5-325 MG PO TABS
1.0000 | ORAL_TABLET | Freq: Four times a day (QID) | ORAL | Status: DC | PRN
  Administered 2019-09-17 – 2019-09-20 (×7): 1 via ORAL
  Filled 2019-09-17 (×7): qty 1

## 2019-09-17 MED ORDER — INSULIN ASPART 100 UNIT/ML ~~LOC~~ SOLN
0.0000 [IU] | SUBCUTANEOUS | Status: DC
  Administered 2019-09-17: 5 [IU] via SUBCUTANEOUS
  Administered 2019-09-17: 3 [IU] via SUBCUTANEOUS
  Administered 2019-09-17: 7 [IU] via SUBCUTANEOUS
  Administered 2019-09-17: 5 [IU] via SUBCUTANEOUS
  Administered 2019-09-17: 7 [IU] via SUBCUTANEOUS
  Administered 2019-09-18: 5 [IU] via SUBCUTANEOUS
  Administered 2019-09-18 (×3): 2 [IU] via SUBCUTANEOUS
  Administered 2019-09-18: 3 [IU] via SUBCUTANEOUS
  Administered 2019-09-19 (×3): 2 [IU] via SUBCUTANEOUS
  Administered 2019-09-19: 3 [IU] via SUBCUTANEOUS
  Administered 2019-09-19 (×3): 2 [IU] via SUBCUTANEOUS
  Administered 2019-09-20: 1 [IU] via SUBCUTANEOUS
  Administered 2019-09-20: 2 [IU] via SUBCUTANEOUS
  Administered 2019-09-20: 1 [IU] via SUBCUTANEOUS
  Administered 2019-09-20 (×2): 2 [IU] via SUBCUTANEOUS
  Administered 2019-09-20: 1 [IU] via SUBCUTANEOUS
  Filled 2019-09-17: qty 0.09

## 2019-09-17 MED ORDER — ONDANSETRON HCL 4 MG PO TABS
4.0000 mg | ORAL_TABLET | Freq: Four times a day (QID) | ORAL | Status: DC | PRN
  Administered 2019-09-17: 4 mg via ORAL
  Filled 2019-09-17: qty 1

## 2019-09-17 MED ORDER — LIDOCAINE 2% (20 MG/ML) 5 ML SYRINGE
INTRAMUSCULAR | Status: DC | PRN
  Administered 2019-09-17: 80 mg via INTRAVENOUS

## 2019-09-17 MED ORDER — DEXAMETHASONE SODIUM PHOSPHATE 4 MG/ML IJ SOLN
INTRAMUSCULAR | Status: DC | PRN
  Administered 2019-09-17: 8 mg via INTRAVENOUS

## 2019-09-17 MED ORDER — SODIUM CHLORIDE 0.9 % IV SOLN
80.0000 mg | Freq: Once | INTRAVENOUS | Status: AC
  Administered 2019-09-17: 80 mg via INTRAVENOUS
  Filled 2019-09-17: qty 80

## 2019-09-17 MED ORDER — PHENYLEPHRINE 40 MCG/ML (10ML) SYRINGE FOR IV PUSH (FOR BLOOD PRESSURE SUPPORT)
PREFILLED_SYRINGE | INTRAVENOUS | Status: DC | PRN
  Administered 2019-09-17 (×2): 80 ug via INTRAVENOUS
  Administered 2019-09-17: 120 ug via INTRAVENOUS

## 2019-09-17 MED ORDER — PANTOPRAZOLE SODIUM 40 MG IV SOLR: 40.0000 mg | Freq: Two times a day (BID) | INTRAVENOUS | Status: DC

## 2019-09-17 MED ORDER — ORAL CARE MOUTH RINSE
15.0000 mL | Freq: Two times a day (BID) | OROMUCOSAL | Status: DC
  Administered 2019-09-19 – 2019-09-20 (×2): 15 mL via OROMUCOSAL

## 2019-09-17 MED ORDER — PROPOFOL 10 MG/ML IV BOLUS
INTRAVENOUS | Status: DC | PRN
  Administered 2019-09-17: 140 mg via INTRAVENOUS

## 2019-09-17 MED ORDER — PROPOFOL 10 MG/ML IV BOLUS
INTRAVENOUS | Status: AC
  Filled 2019-09-17: qty 20

## 2019-09-17 MED ORDER — CHLORHEXIDINE GLUCONATE 0.12 % MT SOLN
15.0000 mL | Freq: Two times a day (BID) | OROMUCOSAL | Status: DC
  Administered 2019-09-17 – 2019-09-20 (×7): 15 mL via OROMUCOSAL
  Filled 2019-09-17 (×4): qty 15

## 2019-09-17 NOTE — Transfer of Care (Signed)
Immediate Anesthesia Transfer of Care Note  Patient: Olivia Benson  Procedure(s) Performed: ESOPHAGOGASTRODUODENOSCOPY (EGD) (N/A )  Patient Location: PACU and Endoscopy Unit  Anesthesia Type:General  Level of Consciousness: drowsy and patient cooperative  Airway & Oxygen Therapy: Patient Spontanous Breathing and Patient connected to face mask oxygen  Post-op Assessment: Report given to RN and Post -op Vital signs reviewed and stable  Post vital signs: Reviewed and stable  Last Vitals:  Vitals Value Taken Time  BP    Temp    Pulse    Resp    SpO2      Last Pain:  Vitals:   09/17/19 0914  TempSrc: Oral  PainSc: 0-No pain         Complications: No complications documented.

## 2019-09-17 NOTE — Anesthesia Postprocedure Evaluation (Signed)
Anesthesia Post Note  Patient: MILLEE DENISE  Procedure(s) Performed: ESOPHAGOGASTRODUODENOSCOPY (EGD) (N/A )     Patient location during evaluation: Endoscopy Anesthesia Type: General Level of consciousness: awake and alert Pain management: pain level controlled Vital Signs Assessment: post-procedure vital signs reviewed and stable Respiratory status: spontaneous breathing, nonlabored ventilation, respiratory function stable and patient connected to nasal cannula oxygen Cardiovascular status: blood pressure returned to baseline and stable Postop Assessment: no apparent nausea or vomiting Anesthetic complications: no   No complications documented.  Last Vitals:  Vitals:   09/17/19 1110 09/17/19 1120  BP: (!) 119/34 (!) 119/51  Pulse: 84 81  Resp: (!) 21 20  Temp:    SpO2: 90% 93%    Last Pain:  Vitals:   09/17/19 1120  TempSrc:   PainSc: 0-No pain                 Makinlee Awwad L Jermiah Soderman

## 2019-09-17 NOTE — Telephone Encounter (Signed)
Husband called and left a message. She is admitted to the hospital.

## 2019-09-17 NOTE — ED Triage Notes (Signed)
Patient BIBEMS due to the complaints of bright red emesis and dark tarry stool, denies any pain, is a cancer patient with last chemo tx 9 days ago, states this is her usual pattern post treatment but today it is worst.

## 2019-09-17 NOTE — Interval H&P Note (Signed)
History and Physical Interval Note:  09/17/2019 9:07 AM  Olivia Benson  has presented today for surgery, with the diagnosis of variceal bleed.  The various methods of treatment have been discussed with the patient and family. After consideration of risks, benefits and other options for treatment, the patient has consented to  Procedure(s): ESOPHAGOGASTRODUODENOSCOPY (EGD) (N/A) as a surgical intervention.  The patient's history has been reviewed, patient examined, no change in status, stable for surgery.  I have reviewed the patient's chart and labs.  Questions were answered to the patient's satisfaction.     Landry Dyke

## 2019-09-17 NOTE — Consult Note (Signed)
[facility] Consultation Note  Referring Provider: [facility] Primary Care Physician:  Gregor Hams, FNP Primary Gastroenterologist:  Dr. Ronnette Juniper  Reason for Consultation:  GI bleeding  HPI: Olivia Benson is a 74 y.o. female with multiple medical problems including metastatic ovarian cancer and NASH-mediated cirrhosis.  Prior history of GI bleeding requiring esophageal variceal band ligation, last in April of this year.  She has had a few days of black stools and a couple days of hematemesis, 7-8 episodes over the past 24 hours and last episode about one hour ago. She has upper abdominal discomfort, now and intermittently as her baseline.     Past Medical History:  Diagnosis Date  . Asthma   . Cirrhosis ([place])   . Complication of anesthesia    slow to wake up / n & v  . Diabetes mellitus   . Elevated LFTs   . Family history of adverse reaction to anesthesia    multiple family members have had problems such as slow to wake / "flat lined" /  ventilator    . Frequency of urination   . Glaucoma, narrow-angle   . History of skin cancer   . Hypertension   . Non-ST elevated myocardial infarction (non-STEMI) [facility]) Sept 2012   Mild elevation in troponin and CK MB but no evidence of coronary disease at time of cath; per patient " i had undiagnosed heart event but not a heart attack"   . Obesity   . Ovarian cancer ([place]) 11/22/2013   Disseminated ovarian cancer; 09-25-2018   "my cancer is back and im restarting chemo on tuesday 09-25-2018]  . Overactive bladder   . PONV (postoperative nausea and vomiting)    no problems with feb  egd   . Portal hypertension ([place])   . Restless leg syndrome   . Tingling    hands and feet    Past Surgical History:  Procedure Laterality Date  . ABDOMINAL ULTRASOUND  Sept 2012   No gallbladder disease, + fatty liver  . CARDIAC CATHETERIZATION  10/25/2010   EF 60%; Normal coronaries  . COLONOSCOPY  10/18/2005  . COLONOSCOPY WITH  PROPOFOL N/A 09/28/2018   Procedure: COLONOSCOPY WITH PROPOFOL;  Surgeon: Ronnette Juniper, MD;  Location: [facility];  Service: Gastroenterology;  Laterality: N/A;  . COSMETIC SURGERY  2003  . DENTAL SURGERY    . ESOPHAGEAL BANDING  02/10/2018   Procedure: ESOPHAGEAL BANDING;  Surgeon: Ronnette Juniper, MD;  Location: [facility];  Service: Gastroenterology;;  . ESOPHAGEAL BANDING N/A 03/23/2018   Procedure: ESOPHAGEAL BANDING;  Surgeon: Ronnette Juniper, MD;  [facility]: [facility];  Service: Gastroenterology;  Laterality: N/A;  . ESOPHAGEAL BANDING N/A 09/28/2018   Procedure: ESOPHAGEAL BANDING;  Surgeon: Ronnette Juniper, MD;  [facility]: [facility];  Service: Gastroenterology;  Laterality: N/A;  . ESOPHAGEAL BANDING  03/01/2019   Procedure: ESOPHAGEAL BANDING;  Surgeon: Ronnette Juniper, MD;  [facility];  Service: Gastroenterology;;  . ESOPHAGOGASTRODUODENOSCOPY N/A 03/23/2018   Procedure: ESOPHAGOGASTRODUODENOSCOPY (EGD);  Surgeon: Ronnette Juniper, MD;  Location: [facility];  Service: Gastroenterology;  Laterality: N/A;  . ESOPHAGOGASTRODUODENOSCOPY (EGD) WITH PROPOFOL N/A 02/10/2018   Procedure: ESOPHAGOGASTRODUODENOSCOPY (EGD) WITH PROPOFOL;  Surgeon: Ronnette Juniper, MD;  [facility];  Service: Gastroenterology;  Laterality: N/A;  . ESOPHAGOGASTRODUODENOSCOPY (EGD) WITH PROPOFOL N/A 09/28/2018   Procedure: ESOPHAGOGASTRODUODENOSCOPY (EGD) WITH PROPOFOL;  Surgeon: Ronnette Juniper, MD;  [facility];  Service: Gastroenterology;  Laterality: N/A;  . ESOPHAGOGASTRODUODENOSCOPY (EGD) WITH PROPOFOL N/A 03/01/2019   Procedure: ESOPHAGOGASTRODUODENOSCOPY (  EGD) WITH PROPOFOL with banding;  Surgeon: Ronnette Juniper, MD;  [facility];  Service: Gastroenterology;  Laterality: N/A;  . ESOPHAGOGASTRODUODENOSCOPY (EGD) WITH PROPOFOL N/A 05/24/2019   Procedure: ESOPHAGOGASTRODUODENOSCOPY (EGD) WITH PROPOFOL;  Surgeon: Ronnette Juniper, MD;  [facility];  Service: Gastroenterology;  Laterality:  N/A;  . EYE SURGERY Bilateral 09/19/14 R eye, 08/2014 L eye   open angles in both eyes d/t narrow angle glaucoma  . GASTRIC VARICES BANDING N/A 05/24/2019   Procedure: GASTRIC VARICES BANDING;  Surgeon: Ronnette Juniper, MD;  [facility];  Service: Gastroenterology;  Laterality: N/A;  . LAPAROTOMY N/A 03/29/2014   Procedure: EXPLORATORY LAPAROTOMY;  Surgeon: Everitt Amber, MD;  Location: [facility];  Service: Gynecology;  Laterality: N/A;  . ORIF ANKLE FRACTURE Right 03/09/2018   Procedure: OPEN REDUCTION INTERNAL FIXATION (ORIF) RIGHT LATERAL MALLEOLUS;  Surgeon: Leandrew Koyanagi, MD;  Location: [facility];  Service: Orthopedics;  Laterality: Right;  . PARTIAL HYSTERECTOMY  1988  . POLYPECTOMY  09/28/2018   Procedure: POLYPECTOMY;  Surgeon: Ronnette Juniper, MD;  [facility];  Service: Gastroenterology;;  . SALPINGOOPHORECTOMY Bilateral 03/29/2014   Procedure: BILATERAL SALPINGO OOPHORECTOMY, OMENTECTOMY, DEBULKING;  Surgeon: Everitt Amber, MD;  Location: [facility];  Service: Gynecology;  Laterality: Bilateral;  . TONSILLECTOMY  1971  . TUBAL LIGATION    . [facility]  11/24/2009    Prior to Admission medications   Medication Sig Start Date End Date Taking? Authorizing Provider  cholecalciferol (VITAMIN D) 1000 units tablet Take 1,000 Units by mouth at bedtime.    Yes [provider]  dexamethasone (DECADRON) 4 MG tablet Start the day before Taxotere, take 1 tablet in the morning, and then 1 tablet daily after chemo for 2 days. Pls dispense 30 tabs Patient taking differently: Take 4 mg by mouth See admin instructions. Start the day before Taxotere, take 1 tablet in the morning, and then 1 tablet daily after chemo for 2 days. Pls dispense 30 tabs 12/15/18  Yes Gorsuch, Ni, MD  gabapentin (NEURONTIN) 300 MG capsule Take 3 capsules (900 mg total) by mouth 2 (two) times daily. 03/02/19  Yes Gorsuch, Ni, MD  HUMALOG MIX 75/25 KWIKPEN (75-25) 100 UNIT/ML Kwikpen Inject 22 Units into the skin 2 (two)  times daily. Before breakfast & before supper Except when taking Chemo take 32 units  For three to five days 02/19/19  Yes [provider]  HYDROcodone-acetaminophen (NORCO/VICODIN) 5-325 MG tablet Take 1 tablet by mouth every 6 (six) hours as needed for moderate pain. 08/17/19  Yes Gorsuch, Ni, MD  lidocaine-prilocaine (EMLA) cream Apply to Porta-cath  1-2 hours prior to access as directed. Patient taking differently: Apply 1 application topically daily as needed (port access).  11/13/17  Yes Gorsuch, Ni, MD  nadolol (CORGARD) 20 MG tablet Take 1 tablet (20 mg total) by mouth daily. Patient taking differently: Take 20 mg by mouth at bedtime.  03/23/18 09/17/19 Yes Ronnette Juniper, MD  ondansetron (ZOFRAN) 8 MG tablet Take 1 tablet (8 mg total) by mouth every 8 (eight) hours as needed. Start on the third day after chemotherapy. Patient taking differently: Take 8 mg by mouth every 8 (eight) hours as needed for nausea. Start on the third day after chemotherapy. 09/18/18  Yes Gorsuch, Ni, MD  pantoprazole (PROTONIX) 40 MG tablet Take 1 tablet (40 mg total) by mouth daily. 07/09/18  Yes [name], MD  PROAIR HFA 108 (90 Base) MCG/ACT inhaler Inhale 2 puffs into the lungs every 4 (four) hours as needed  for wheezing or shortness of breath.  11/24/18  Yes [provider]  prochlorperazine (COMPAZINE) 10 MG tablet Take 1 tablet (10 mg total) by mouth every 6 (six) hours as needed (Nausea or vomiting). 09/18/18  Yes Gorsuch, Ni, MD  rotigotine (NEUPRO) 4 MG/24HR Place 1 patch onto the skin daily. 04/06/19  Yes Tat, Eustace Quail, DO    Current Facility-Administered Medications  Medication Dose Route Frequency Provider Last Rate Last Admin  . 0.9 %  sodium chloride infusion (Manually program via Guardrails IV Fluids)   Intravenous Once Rise Patience, MD      . cefTRIAXone (ROCEPHIN) 2 g in sodium chloride 0.9 % 100 mL IVPB  2 g Intravenous Q24H Rise Patience, MD 200 mL/hr at 09/17/19 0736 2 g at  09/17/19 0736  . insulin aspart (novoLOG) injection 0-9 Units  0-9 Units Subcutaneous Q4H Rise Patience, MD   5 Units at 09/17/19 [phone]  . insulin glargine (LANTUS) injection 10 Units  10 Units Subcutaneous Daily Amin, Ankit Chirag, MD      . octreotide (SANDOSTATIN) 500 mcg in sodium chloride 0.9 % 250 mL (2 mcg/mL) infusion  50 mcg/hr Intravenous Continuous Rise Patience, MD 25 mL/hr at 09/17/19 0446 50 mcg/hr at 09/17/19 0446  . ondansetron (ZOFRAN) tablet 4 mg  4 mg Oral Q6H PRN Rise Patience, MD   4 mg at 09/17/19 0617   Or  . ondansetron (ZOFRAN) injection 4 mg  4 mg Intravenous Q6H PRN Rise Patience, MD      . pantoprazole (PROTONIX) 80 mg in sodium chloride 0.9 % 100 mL (0.8 mg/mL) infusion  8 mg/hr Intravenous Continuous Rise Patience, MD      . pantoprazole (PROTONIX) 80 mg in sodium chloride 0.9 % 100 mL IVPB  80 mg Intravenous Once Rise Patience, MD      . Derrill Memo ON 09/20/2019] pantoprazole (PROTONIX) injection 40 mg  40 mg Intravenous Q12H Rise Patience, MD       Current Outpatient Medications  Medication Sig Dispense Refill  . cholecalciferol (VITAMIN D) 1000 units tablet Take 1,000 Units by mouth at bedtime.     Marland Kitchen dexamethasone (DECADRON) 4 MG tablet Start the day before Taxotere, take 1 tablet in the morning, and then 1 tablet daily after chemo for 2 days. Pls dispense 30 tabs (Patient taking differently: Take 4 mg by mouth See admin instructions. Start the day before Taxotere, take 1 tablet in the morning, and then 1 tablet daily after chemo for 2 days. Pls dispense 30 tabs) 30 tablet 1  . gabapentin (NEURONTIN) 300 MG capsule Take 3 capsules (900 mg total) by mouth 2 (two) times daily. 120 capsule 11  . HUMALOG MIX 75/25 KWIKPEN (75-25) 100 UNIT/ML Kwikpen Inject 22 Units into the skin 2 (two) times daily. Before breakfast & before supper Except when taking Chemo take 32 units  For three to five days    . HYDROcodone-acetaminophen  (NORCO/VICODIN) 5-325 MG tablet Take 1 tablet by mouth every 6 (six) hours as needed for moderate pain. 60 tablet 0  . lidocaine-prilocaine (EMLA) cream Apply to Porta-cath  1-2 hours prior to access as directed. (Patient taking differently: Apply 1 application topically daily as needed (port access). ) 30 g 9  . nadolol (CORGARD) 20 MG tablet Take 1 tablet (20 mg total) by mouth daily. (Patient taking differently: Take 20 mg by mouth at bedtime. ) 90 tablet 3  . ondansetron (ZOFRAN) 8 MG tablet Take  1 tablet (8 mg total) by mouth every 8 (eight) hours as needed. Start on the third day after chemotherapy. (Patient taking differently: Take 8 mg by mouth every 8 (eight) hours as needed for nausea. Start on the third day after chemotherapy.) 30 tablet 1  . pantoprazole (PROTONIX) 40 MG tablet Take 1 tablet (40 mg total) by mouth daily.    Marland Kitchen PROAIR HFA 108 (90 Base) MCG/ACT inhaler Inhale 2 puffs into the lungs every 4 (four) hours as needed for wheezing or shortness of breath.     . prochlorperazine (COMPAZINE) 10 MG tablet Take 1 tablet (10 mg total) by mouth every 6 (six) hours as needed (Nausea or vomiting). 30 tablet 1  . rotigotine (NEUPRO) 4 MG/24HR Place 1 patch onto the skin daily. 30 patch 5    Allergies as of 09/17/2019 - Review Complete 09/17/2019  Allergen Reaction Noted  . Carboplatin Anaphylaxis 11/24/2018  . Shellfish allergy Anaphylaxis 10/22/2012  . Vancomycin Rash 12/31/2018  . Benadryl [diphenhydramine hcl] Other (See Comments) 02/21/2014  . Penicillins Rash 11/06/2010    Family History  Problem Relation Age of Onset  . Asthma Mother   . Diabetes Mother   . Hypertension Mother   . Allergies Mother   . Rheum arthritis Mother   . Bladder Cancer Father 55       former heavy smoker  . Hypertension Father   . Stroke Father   . Breast cancer Sister 67  . Diabetes Sister        x 3  . Allergies Sister   . Asthma Sister        x 2   . Breast cancer Maternal Aunt 85  .  Breast cancer Cousin 85       (maternal) bilateral cancer  . Aneurysm Son 9  . Diabetes Brother   . Allergies Brother   . Asthma Brother     Social History   Socioeconomic History  . Marital status: Married    Spouse name: Not on file  . Number of children: 1 D  . Years of education: Not on file  . Highest education level: Not on file  Occupational History  . Occupation: Retired  Tobacco Use  . Smoking status: Former Smoker    Packs/day: 1.00    Years: 1.00    Pack years: 1.00    Types: Cigarettes    Quit date: 02/11/1982    Years since quitting: 37.6  . Smokeless tobacco: Never Used  Substance and Sexual Activity  . Alcohol use: No    Alcohol/week: 0.0 standard drinks  . Drug use: No  . Sexual activity: Not on file  Other Topics Concern  . Not on file  Social History Narrative   Left handed   Lives in single story home with husband   Social Determinants of Health   Financial Resource Strain:   . Difficulty of Paying Living Expenses:   Food Insecurity:   . Worried About Charity fundraiser in the Last Year:   . Arboriculturist in the Last Year:   Transportation Needs:   . Film/video editor (Medical):   Marland Kitchen Lack of Transportation (Non-Medical):   Physical Activity:   . Days of Exercise per Week:   . Minutes of Exercise per Session:   Stress:   . Feeling of Stress :   Social Connections:   . Frequency of Communication with Friends and Family:   . Frequency of Social Gatherings with Friends and  Family:   . Attends Religious Services:   . Active Member of Clubs or Organizations:   . Attends Archivist Meetings:   Marland Kitchen Marital Status:   Intimate Partner Violence:   . Fear of Current or Ex-Partner:   . Emotionally Abused:   Marland Kitchen Physically Abused:   . Sexually Abused:     Review of Systems: As per HPI, all others negative  Physical Exam: Vital signs in last 24 hours: Temp:  [98 F (36.7 C)] 98 F (36.7 C) (08/06 0155) Pulse Rate:  [75-94] 76  (08/06 0700) Resp:  [15-17] 16 (08/06 0700) BP: (93-140)/(54-78) 131/78 (08/06 0700) SpO2:  [94 %-100 %] 100 % (08/06 0700) Weight:  [88.5 kg] 88.5 kg (08/06 0155)   General:   Alert,  overweight, pleasant and cooperative in NAD Head:  Normocephalic and atraumatic. Eyes:  Sclera clear, no icterus.   Conjunctiva pink. Ears:  Normal auditory acuity. Nose:  No deformity, discharge,  or lesions. Mouth:  No deformity or lesions.  Oropharynx pink & moist. Neck:  Supple; no masses or thyromegaly. Abdomen:  Soft, protuberant, mild tenderness without peritonitis, nondistended. No masses, hepatosplenomegaly or hernias noted. Normal bowel sounds, without guarding, and without rebound.     Msk:  Symmetrical without gross deformities. Normal posture. Pulses:  Normal pulses noted. Extremities:  Without clubbing or edema. Neurologic:  Alert and  oriented x4;  grossly normal neurologically. Skin:  Intact without significant lesions or rashes. Psych:  Alert and cooperative. Normal mood and affect.   Lab Results: Recent Labs    09/17/19 0224  WBC 28.9*  HGB 7.2*  HCT 23.0*  PLT 135*   BMET Recent Labs    09/17/19 0224  NA 136  K 4.8  CL 106  CO2 21*  GLUCOSE 314*  BUN 51*  CREATININE 0.99  CALCIUM 8.0*   LFT Recent Labs    09/17/19 0224  PROT 5.4*  ALBUMIN 3.3*  AST 22  ALT 24  ALKPHOS 139*  BILITOT 0.9   PT/INR Recent Labs    09/17/19 0224  LABPROT 17.0*  INR 1.4*    Studies/Results: No results found.  Impression:  1.  Hematemesis.  Melena. 2.  Upper abdominal discomfort. 3.  Cirrhosis, fatty liver mediated, complicated by prior variceal bleeds.  Plan:  1.  Octreotide, PPI, supportive care including resuscitation. 2.  NPO. 3.  EGD today.  Patient is aware of, and has experienced previously, chest pain and odynophagia after esophageal banding, for which sucralfate suspension has been prescribed. 4.  Risks (bleeding, infection, bowel perforation that could  require surgery, sedation-related changes in cardiopulmonary systems), benefits (identification and possible treatment of source of symptoms, exclusion of certain causes of symptoms), and alternatives (watchful waiting, radiographic imaging studies, empiric medical treatment) of upper endoscopy (EGD) were explained to patient/family in detail and patient wishes to proceed.   LOS: 0 days   Zidan Helget M  09/17/2019, 8:56 AM  Cell [phone] If no answer or after 5 PM call [phone]

## 2019-09-17 NOTE — Progress Notes (Addendum)
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  GI consulted, started on octreotide infusion.   Assessment & Plan:   Principal Problem:   Acute GI bleeding Active Problems:   Fatty liver disease, nonalcoholic   Essential hypertension   Disseminated ovarian cancer (HCC)   Diabetes mellitus with diabetic neuropathy, with long-term current use of insulin (HCC)   Chemotherapy-induced thrombocytopenia  Acute upper GI bleed History of variceal bleeding [name] cirrhosis -Continue Protonix and octreotide drip -Empiric Rocephin -1 unit PRBC transfusion -N.p.o., GI consulted for endoscopic evaluation- minimal bleeding noted.   Acute blood loss anemia -Baseline hemoglobin around 10, evaluation hemoglobin 7.2 -1 unit PRBC ordered -Follow CBC  Diabetes mellitus type 2, uncontrolled secondary to hyperglycemia -Insulin sliding scale and Accu-Cheks -10 units daily Lantus  Primary progressive peritoneal carcinoma Thrombocytopenia with splenomegaly, chemo induced Iron deficiency anemia   -Oncology following, getting outpatient chemo.  If patient remains intubated post EGD, patient will be transferred to critical care service.   DVT prophylaxis: SCDs Code Status: Full code Family Communication:    Status is: Inpatient  Remains inpatient appropriate because:Hemodynamically unstable   Dispo: The patient is from: Home              Anticipated d/c is to: Home              Anticipated d/c date is: > 3 days              Patient currently is not medically stable to d/c.  Patient is having active bleeding, ongoing evaluation.   Body mass index is 35.67 kg/m.       Subjective: Patient seen post procedure in the Endosuite. Minimal bleeding. Feels ok, extubated post procedure.   General = no fevers, chills, dizziness,  fatigue HEENT/EYES = negative for loss of vision, double vision, blurred vision,  sore throa Cardiovascular= negative for chest pain, palpitation Respiratory/lungs= negative for shortness of breath, cough, wheezing; hemoptysis,  Gastrointestinal= negative for nausea, vomiting, abdominal pain Genitourinary= negative for Dysuria MSK = Negative for arthralgia, myalgias Neurology= Negative for headache, numbness, tingling  Psychiatry= Negative for suicidal and homocidal ideation Skin= Negative for Rash  Constitutional: Not in acute distress Respiratory: Clear to auscultation bilaterally Cardiovascular: Normal sinus rhythm, no rubs Abdomen: Nontender nondistended good bowel sounds Musculoskeletal: No edema noted Skin: No rashes seen Neurologic: CN 2-12 grossly intact.  And nonfocal Psychiatric: Normal judgment and insight. Alert and oriented x 3. Normal mood.        Objective: Vitals:   09/17/19 0400 09/17/19 0500 09/17/19 0600 09/17/19 0700  BP: 140/75 (!) 93/54 (!) 105/59 131/78  Pulse: 77 80 94 76  Resp: 16 17 15 16   Temp:      TempSrc:      SpO2: 100% 98% 98% 100%  Weight:      Height:       No intake or output data in the 24 hours ending 09/17/19 0805 Filed Weights   09/17/19 0155  Weight: 88.5 kg     Data Reviewed:   CBC: Recent Labs  Lab 09/17/19 0224  WBC 28.9*  NEUTROABS 23.4*  HGB 7.2*  HCT 23.0*  MCV 99.6  PLT 595*   Basic Metabolic Panel: Recent Labs  Lab 09/17/19  0224  NA 136  K 4.8  CL 106  CO2 21*  GLUCOSE 314*  BUN 51*  CREATININE 0.99  CALCIUM 8.0*   GFR: Estimated Creatinine Clearance: 53.9 mL/min (by C-G formula based on SCr of 0.99 mg/dL). Liver Function Tests: Recent Labs  Lab 09/17/19 0224  AST 22  ALT 24  ALKPHOS 139*  BILITOT 0.9  PROT 5.4*  ALBUMIN 3.3*    No results for input(s): LIPASE, AMYLASE in the last 168 hours. No results for input(s): AMMONIA in the last 168 hours. Coagulation Profile: Recent Labs  Lab 09/17/19 0224  INR 1.4*   Cardiac Enzymes: No results for input(s): CKTOTAL, CKMB, CKMBINDEX, TROPONINI in the last 168 hours. BNP (last 3 results) No results for input(s): PROBNP in the last 8760 hours. HbA1C: No results for input(s): HGBA1C in the last 72 hours. CBG: Recent Labs  Lab 09/17/19 0605 09/17/19 0742  GLUCAP 299* 287*   Lipid Profile: No results for input(s): CHOL, HDL, LDLCALC, TRIG, CHOLHDL, LDLDIRECT in the last 72 hours. Thyroid Function Tests: No results for input(s): TSH, T4TOTAL, FREET4, T3FREE, THYROIDAB in the last 72 hours. Anemia Panel: No results for input(s): VITAMINB12, FOLATE, FERRITIN, TIBC, IRON, RETICCTPCT in the last 72 hours. Sepsis Labs: No results for input(s): PROCALCITON, LATICACIDVEN in the last 168 hours.  Recent Results (from the past 240 hour(s))  SARS Coronavirus 2 by RT PCR (hospital order, performed in [facility] hospital lab) Nasopharyngeal Nasopharyngeal Swab     Status: None   Collection Time: 09/17/19  3:24 AM   Specimen: Nasopharyngeal Swab  Result Value Ref Range Status   SARS Coronavirus 2 NEGATIVE NEGATIVE Final    Comment: (NOTE) SARS-CoV-2 target nucleic acids are NOT DETECTED.  The SARS-CoV-2 RNA is generally detectable in upper and lower respiratory specimens during the acute phase of infection. The lowest concentration of SARS-CoV-2 viral copies this assay can detect is 250 copies / mL. A negative result does not preclude SARS-CoV-2 infection and should not be used as the sole basis for treatment or other patient management decisions.  A negative result may occur with improper specimen collection / handling, submission of specimen other than nasopharyngeal swab, presence of viral mutation(s) within the areas targeted by this assay, and inadequate number  of viral copies (<250 copies / mL). A negative result must be combined with clinical observations, patient history, and epidemiological information.  Fact Sheet for Patients:   StrictlyIdeas.no  Fact Sheet for Healthcare Providers: BankingDealers.co.za  This test is not yet approved or  cleared by the [place] FDA and has been authorized for detection and/or diagnosis of SARS-CoV-2 by FDA under an Emergency Use Authorization (EUA).  This EUA will remain in effect (meaning this test can be used) for the duration of the COVID-19 declaration under Section 564(b)(1) of the Act, 21 U.S.C. section 360bbb-3(b)(1), unless the authorization is terminated or revoked sooner.  Performed at [facility], [place] [address] [place], [place] [address]          Radiology Studies: No results found.      Scheduled Meds: . sodium chloride   Intravenous Once  . insulin aspart  0-9 Units Subcutaneous Q4H  . [START ON 09/20/2019] pantoprazole  40 mg Intravenous Q12H   Continuous Infusions: . cefTRIAXone (ROCEPHIN)  IV 2 g (09/17/19 0736)  . octreotide  (SANDOSTATIN)    IV infusion 50 mcg/hr (09/17/19 0446)  . pantoprozole (PROTONIX) infusion    . pantoprazole (PROTONIX) IVPB  LOS: 0 days   Time spent= 35 mins    Mariell Nester Arsenio Loader, MD [facility]  If 7PM-7AM, please contact night-coverage  09/17/2019, 8:05 AM

## 2019-09-17 NOTE — ED Provider Notes (Signed)
[facility] DEPT Provider Note   CSN: [id number] Arrival date & time: 09/17/19  0144   Time seen 2:12 AM  History Chief Complaint  Patient presents with  . Melena  . Hemoptysis    Olivia Benson is a 74 y.o. female.  HPI   Patient reports about 24 hours ago she started seeing tarry stools and some blood in her stools.  She states at midnight tonight she started having nausea and then started vomiting bright red blood with some clots.  She states she has done this before with her esophageal varices.  She had banding done in April and is scheduled to have it done again in August.  She is followed by Dr. Therisa Doyne, gastroenterologist.  She states she had some bright red blood from her rectal area a few weeks ago and when she called Dr. Encarnacion Slates office they told her to do sitz bath and Preparation H however she states she does not have hemorrhoids.  She states it did finally resolve.  She states tonight she is having some mild abdominal discomfort in the epigastric area that is achy.  She has chronic back pain.  She states yesterday she started feeling a little dizzy and lightheaded and has to have help if she is going to ambulate.  She has had decreased appetite.  Her last chemotherapy was July 27 and she gets it every 3 weeks.  It looks like she is on Taxotere.  Patient is being treated for primary peritoneal carcinomatosis.  PCP Gregor Hams, FNP Oncologist Dr Alvy Bimler  Past Medical History:  Diagnosis Date  . Asthma   . Cirrhosis ([place])   . Complication of anesthesia    slow to wake up / n & v  . Diabetes mellitus   . Elevated LFTs   . Family history of adverse reaction to anesthesia    multiple family members have had problems such as slow to wake / "flat lined" /  ventilator    . Frequency of urination   . Glaucoma, narrow-angle   . History of skin cancer   . Hypertension   . Non-ST elevated myocardial infarction (non-STEMI) [facility]) Sept 2012   Mild  elevation in troponin and CK MB but no evidence of coronary disease at time of cath; per patient " i had undiagnosed heart event but not a heart attack"   . Obesity   . 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 Overactive bladder   . PONV (postoperative nausea and vomiting)    no problems with feb  egd   . Portal hypertension ([place])   . Restless leg syndrome   . Tingling    hands and feet    Patient Active Problem List   Diagnosis Date Noted  . Acute GI bleeding 09/17/2019  . Bilateral edema of lower extremity 04/14/2019  . Preventive measure 03/03/2019  . Syncope, vasovagal 01/04/2019  . Phlebitis after infusion 01/04/2019  . Acute lower UTI 01/04/2019  . Peritoneal carcinomatosis ([place]) 01/04/2019  . AKI (acute kidney injury) ([place]) 12/31/2018  . Subdural hematoma ([place]) 12/31/2018  . SAH (subarachnoid hemorrhage) ([place]) 12/31/2018  . Antineoplastic chemotherapy induced pancytopenia ([place]) 12/31/2018  . Severe sepsis with septic shock ([place]) 12/31/2018  . Diarrhea 12/31/2018  . Redness and swelling of upper arm, right 12/31/2018  . Hypocalcemia 12/31/2018  . Coagulopathy ([place])   . Mucositis due to  antineoplastic therapy   . Esophageal varices ([place]) 03/23/2018  . Chronic diastolic CHF (congestive heart failure) ([place]) 03/09/2018  . Fall 03/08/2018  . Displaced fracture of distal end of right fibula 03/08/2018  . GIB (gastrointestinal bleeding) 02/09/2018  . Obesity due to excess calories 03/20/2017  . Cardiomyopathy ([place]) 01/24/2017  . Sinus congestion 01/24/2017  . 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 Goals of care, counseling/discussion 09/03/2016  . Thrombocytopenia ([place]) 04/15/2016  . Vasomotor instability 01/17/2016  . Osteopenia determined by x-ray 12/22/2015  . Encounter for antineoplastic chemotherapy 12/09/2015  . Chemotherapy-induced neuropathy ([place])  10/01/2015  . Elevated tumor markers 10/01/2015  . Obesity (BMI 35.0-39.9 without comorbidity) 05/31/2015  . Port catheter in place 03/29/2015  . Iron deficiency anemia 03/05/2015  . Low back pain 03/05/2015  . Neuropathic pain of foot 11/21/2014  . Anemia associated with chemotherapy 07/03/2014  . Diabetes mellitus without complication ([place]) 87/86/7672  . Portacath in place 05/10/2014  . Leukopenia due to antineoplastic chemotherapy ([place]) 05/10/2014  . Splenomegaly 05/10/2014  . Primary peritoneal carcinomatosis ([place]) 05/08/2014  . Malignant neoplasm of peritoneum ([place]) 05/08/2014  . Genetic testing 03/16/2014  . Neutropenic fever ([place]) 01/27/2014  . Pancytopenia, acquired ([place]) 01/27/2014  . Fever and neutropenia ([place]) 01/27/2014  . Asthma 01/27/2014  . Malignant ascites 01/25/2014  . Chemotherapy induced neutropenia ([place]) 01/25/2014  . Chemotherapy-induced thrombocytopenia 01/25/2014  . RLS (restless legs syndrome) 01/25/2014  . Liver cirrhosis secondary to NASH ([place]) 01/02/2014  . Nonalcoholic steatohepatitis ([facility]) 01/02/2014  . 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 Diabetes mellitus with diabetic neuropathy, with long-term current use of insulin ([place]) 12/03/2013  . Ascites 11/15/2013  . Elevated cancer antigen 125 (CA-125) 11/15/2013  . Cough variant asthma vs UACS 09/02/2013  . Essential hypertension 09/02/2013  . Dyspnea 10/22/2012  . Fatty liver disease, nonalcoholic 09/47/0962  . S/P cardiac cath 11/06/2010  . Personal history of surgery to heart and great vessels, presenting hazards to health 11/06/2010    Past Surgical History:  Procedure Laterality Date  . ABDOMINAL ULTRASOUND  Sept 2012   No gallbladder disease, + fatty liver  . CARDIAC CATHETERIZATION  10/25/2010   EF 60%; Normal coronaries  . COLONOSCOPY  10/18/2005  . COLONOSCOPY WITH PROPOFOL N/A 09/28/2018   Procedure: COLONOSCOPY WITH PROPOFOL;  Surgeon: Ronnette Juniper, MD;  Location: [facility];   Service: Gastroenterology;  Laterality: N/A;  . COSMETIC SURGERY  2003  . DENTAL SURGERY    . ESOPHAGEAL BANDING  02/10/2018   Procedure: ESOPHAGEAL BANDING;  Surgeon: Ronnette Juniper, MD;  Location: [facility];  Service: Gastroenterology;;  . ESOPHAGEAL BANDING N/A 03/23/2018   Procedure: ESOPHAGEAL BANDING;  Surgeon: Ronnette Juniper, MD;  Location: [facility];  Service: Gastroenterology;  Laterality: N/A;  . ESOPHAGEAL BANDING N/A 09/28/2018   Procedure: ESOPHAGEAL BANDING;  Surgeon: Ronnette Juniper, MD;  Location: [facility];  Service: Gastroenterology;  Laterality: N/A;  . ESOPHAGEAL BANDING  03/01/2019   Procedure: ESOPHAGEAL BANDING;  Surgeon: Ronnette Juniper, MD;  Location: [facility];  Service: Gastroenterology;;  . ESOPHAGOGASTRODUODENOSCOPY N/A 03/23/2018   Procedure: ESOPHAGOGASTRODUODENOSCOPY (EGD);  Surgeon: Ronnette Juniper, MD;  Location: [facility];  Service: Gastroenterology;  Laterality: N/A;  . ESOPHAGOGASTRODUODENOSCOPY (EGD) WITH PROPOFOL N/A 02/10/2018   Procedure: ESOPHAGOGASTRODUODENOSCOPY (EGD) WITH PROPOFOL;  Surgeon: Ronnette Juniper, MD;  Location: [facility];  Service: Gastroenterology;  Laterality: N/A;  . ESOPHAGOGASTRODUODENOSCOPY (EGD) WITH PROPOFOL N/A 09/28/2018   Procedure: ESOPHAGOGASTRODUODENOSCOPY (EGD) WITH PROPOFOL;  Surgeon: Ronnette Juniper, MD;  Location: [facility];  Service: Gastroenterology;  Laterality: N/A;  . ESOPHAGOGASTRODUODENOSCOPY (EGD) WITH PROPOFOL N/A 03/01/2019   Procedure: ESOPHAGOGASTRODUODENOSCOPY (EGD) WITH PROPOFOL with banding;  Surgeon: Ronnette Juniper, MD;  [facility]: [facility];  Service: Gastroenterology;  Laterality: N/A;  . ESOPHAGOGASTRODUODENOSCOPY (EGD) WITH PROPOFOL N/A 05/24/2019   Procedure: ESOPHAGOGASTRODUODENOSCOPY (EGD) WITH PROPOFOL;  Surgeon: Ronnette Juniper, MD;  [facility]: [facility];  Service: Gastroenterology;  Laterality: N/A;  . EYE SURGERY Bilateral 09/19/14 R eye, 08/2014 L eye   open angles in both eyes d/t narrow angle glaucoma  .  GASTRIC VARICES BANDING N/A 05/24/2019   Procedure: GASTRIC VARICES BANDING;  Surgeon: Ronnette Juniper, MD;  Location: [facility];  Service: Gastroenterology;  Laterality: N/A;  . LAPAROTOMY N/A 03/29/2014   Procedure: EXPLORATORY LAPAROTOMY;  Surgeon: Everitt Amber, MD;  Location: [facility];  Service: Gynecology;  Laterality: N/A;  . ORIF ANKLE FRACTURE Right 03/09/2018   Procedure: OPEN REDUCTION INTERNAL FIXATION (ORIF) RIGHT LATERAL MALLEOLUS;  Surgeon: Leandrew Koyanagi, MD;  Location: [facility];  Service: Orthopedics;  Laterality: Right;  . PARTIAL HYSTERECTOMY  1988  . POLYPECTOMY  09/28/2018   Procedure: POLYPECTOMY;  Surgeon: Ronnette Juniper, MD;  Location: [facility];  Service: Gastroenterology;;  . SALPINGOOPHORECTOMY Bilateral 03/29/2014   Procedure: BILATERAL SALPINGO OOPHORECTOMY, OMENTECTOMY, DEBULKING;  Surgeon: Everitt Amber, MD;  Location: [facility];  Service: Gynecology;  Laterality: Bilateral;  . TONSILLECTOMY  1971  . TUBAL LIGATION    . WH-MAMMOGRAPHY  11/24/2009     OB History   No obstetric history on file.     Family History  Problem Relation Age of Onset  . Asthma Mother   . Diabetes Mother   . Hypertension Mother   . Allergies Mother   . Rheum arthritis Mother   . Bladder Cancer Father 40       former heavy smoker  . Hypertension Father   . Stroke Father   . Breast cancer Sister 48  . Diabetes Sister        x 3  . Allergies Sister   . Asthma Sister        x 2   . Breast cancer Maternal Aunt 85  . Breast cancer Cousin 33       (maternal) bilateral cancer  . Aneurysm Son 63  . Diabetes Brother   . Allergies Brother   . Asthma Brother     Social History   Tobacco Use  . Smoking status: Former Smoker    Packs/day: 1.00    Years: 1.00    Pack years: 1.00    Types: Cigarettes    Quit date: 02/11/1982    Years since quitting: 37.6  . Smokeless tobacco: Never Used  Substance Use Topics  . Alcohol use: No    Alcohol/week: 0.0 standard drinks  . Drug use: No    Home  Medications Prior to Admission medications   Medication Sig Start Date End Date Taking? Authorizing Provider  cholecalciferol (VITAMIN D) 1000 units tablet Take 1,000 Units by mouth at bedtime.    Yes [provider]  dexamethasone (DECADRON) 4 MG tablet Start the day before Taxotere, take 1 tablet in the morning, and then 1 tablet daily after chemo for 2 days. Pls dispense 30 tabs Patient taking differently: Take 4 mg by mouth See admin instructions. Start the day before Taxotere, take 1 tablet in the morning, and then 1 tablet daily after chemo for 2 days. Pls dispense 30 tabs 12/15/18  Yes Gorsuch, Ni, MD  gabapentin (NEURONTIN) 300 MG  capsule Take 3 capsules (900 mg total) by mouth 2 (two) times daily. 03/02/19  Yes Gorsuch, Ni, MD  HUMALOG MIX 75/25 KWIKPEN (75-25) 100 UNIT/ML Kwikpen Inject 22 Units into the skin 2 (two) times daily. Before breakfast & before supper Except when taking Chemo take 32 units  For three to five days 02/19/19  Yes [provider]  HYDROcodone-acetaminophen (NORCO/VICODIN) 5-325 MG tablet Take 1 tablet by mouth every 6 (six) hours as needed for moderate pain. 08/17/19  Yes Gorsuch, Ni, MD  lidocaine-prilocaine (EMLA) cream Apply to Porta-cath  1-2 hours prior to access as directed. Patient taking differently: Apply 1 application topically daily as needed (port access).  11/13/17  Yes Gorsuch, Ni, MD  nadolol (CORGARD) 20 MG tablet Take 1 tablet (20 mg total) by mouth daily. Patient taking differently: Take 20 mg by mouth at bedtime.  03/23/18 09/17/19 Yes Ronnette Juniper, MD  ondansetron (ZOFRAN) 8 MG tablet Take 1 tablet (8 mg total) by mouth every 8 (eight) hours as needed. Start on the third day after chemotherapy. Patient taking differently: Take 8 mg by mouth every 8 (eight) hours as needed for nausea. Start on the third day after chemotherapy. 09/18/18  Yes Gorsuch, Ni, MD  pantoprazole (PROTONIX) 40 MG tablet Take 1 tablet (40 mg total) by mouth daily. 07/09/18   Yes [name], MD  PROAIR HFA 108 (90 Base) MCG/ACT inhaler Inhale 2 puffs into the lungs every 4 (four) hours as needed for wheezing or shortness of breath.  11/24/18  Yes [provider]  prochlorperazine (COMPAZINE) 10 MG tablet Take 1 tablet (10 mg total) by mouth every 6 (six) hours as needed (Nausea or vomiting). 09/18/18  Yes Gorsuch, Ni, MD  rotigotine (NEUPRO) 4 MG/24HR Place 1 patch onto the skin daily. 04/06/19  Yes Tat, Eustace Quail, DO    Allergies    Carboplatin, Shellfish allergy, Vancomycin, Benadryl [diphenhydramine hcl], and Penicillins  Review of Systems   Review of Systems  All other systems reviewed and are negative.   Physical Exam Updated Vital Signs BP 140/75   Pulse 77   Temp 98 F (36.7 C) (Oral)   Resp 16   Ht 5' 2"  (1.575 m)   Wt 88.5 kg   SpO2 100%   BMI 35.67 kg/m   Physical Exam Vitals and nursing note reviewed.  Constitutional:      General: She is not in acute distress.    Appearance: Normal appearance. She is obese.  HENT:     Head: Normocephalic and atraumatic.     Right Ear: External ear normal.     Left Ear: External ear normal.  Eyes:     Extraocular Movements: Extraocular movements intact.     Conjunctiva/sclera: Conjunctivae normal.     Pupils: Pupils are equal, round, and reactive to light.  Cardiovascular:     Rate and Rhythm: Normal rate and regular rhythm.     Pulses: Normal pulses.     Heart sounds: Normal heart sounds.  Pulmonary:     Effort: Pulmonary effort is normal. No respiratory distress.     Breath sounds: Normal breath sounds.  Abdominal:     General: Abdomen is flat. Bowel sounds are normal.     Palpations: Abdomen is soft.     Tenderness: There is no abdominal tenderness. There is no guarding or rebound.  Musculoskeletal:     Cervical back: Normal range of motion.  Skin:    General: Skin is warm and dry.  Capillary Refill: Capillary refill takes less than 2 seconds.  Neurological:     General: No  focal deficit present.     Mental Status: She is alert and oriented to person, place, and time.     Cranial Nerves: No cranial nerve deficit.  Psychiatric:        Mood and Affect: Mood normal.        Behavior: Behavior normal.        Thought Content: Thought content normal.     ED Results / Procedures / Treatments   Labs (all labs ordered are listed, but only abnormal results are displayed) Results for orders placed or performed during the hospital encounter of 09/17/19  Comprehensive metabolic panel  Result Value Ref Range   Sodium 136 135 - 145 mmol/L   Potassium 4.8 3.5 - 5.1 mmol/L   Chloride 106 98 - 111 mmol/L   CO2 21 (L) 22 - 32 mmol/L   Glucose, Bld 314 (H) 70 - 99 mg/dL   BUN 51 (H) 8 - 23 mg/dL   Creatinine, Ser 0.99 0.44 - 1.00 mg/dL   Calcium 8.0 (L) 8.9 - 10.3 mg/dL   Total Protein 5.4 (L) 6.5 - 8.1 g/dL   Albumin 3.3 (L) 3.5 - 5.0 g/dL   AST 22 15 - 41 U/L   ALT 24 0 - 44 U/L   Alkaline Phosphatase 139 (H) 38 - 126 U/L   Total Bilirubin 0.9 0.3 - 1.2 mg/dL   GFR calc non Af Amer 57 (L) >60 mL/min   GFR calc Af Amer >60 >60 mL/min   Anion gap 9 5 - 15  CBC with Differential  Result Value Ref Range   WBC 28.9 (H) 4.0 - 10.5 K/uL   RBC 2.31 (L) 3.87 - 5.11 MIL/uL   Hemoglobin 7.2 (L) 12.0 - 15.0 g/dL   HCT 23.0 (L) 36 - 46 %   MCV 99.6 80.0 - 100.0 fL   MCH 31.2 26.0 - 34.0 pg   MCHC 31.3 30.0 - 36.0 g/dL   RDW 18.0 (H) 11.5 - 15.5 %   Platelets 135 (L) 150 - 400 K/uL   nRBC 0.4 (H) 0.0 - 0.2 %   Neutrophils Relative % 81 %   Neutro Abs 23.4 (H) 1.7 - 7.7 K/uL   Lymphocytes Relative 8 %   Lymphs Abs 2.2 0.7 - 4.0 K/uL   Monocytes Relative 4 %   Monocytes Absolute 1.1 (H) 0 - 1 K/uL   Eosinophils Relative 0 %   Eosinophils Absolute 0.0 0 - 0 K/uL   Basophils Relative 0 %   Basophils Absolute 0.1 0 - 0 K/uL   WBC Morphology MILD LEFT SHIFT (1-5% METAS, OCC MYELO, OCC BANDS)    Immature Granulocytes 7 %   Abs Immature Granulocytes 2.10 (H) 0.00 - 0.07  K/uL   Tear Drop Cells PRESENT    Polychromasia PRESENT   Protime-INR  Result Value Ref Range   Prothrombin Time 17.0 (H) 11.4 - 15.2 seconds   INR 1.4 (H) 0.8 - 1.2  APTT  Result Value Ref Range   aPTT 41 (H) 24 - 36 seconds   *Note: Due to a large number of results and/or encounters for the requested time period, some results have not been displayed. A complete set of results can be found in Results Review.   Laboratory interpretation all normal except new anemia, hyperglycemia, leukocyotosis    EKG None  Radiology No results found.  Procedures .Critical Care Performed by:  Rolland Porter, MD Authorized by: Rolland Porter, MD   Critical care provider statement:    Critical care time (minutes):  35   Critical care was necessary to treat or prevent imminent or life-threatening deterioration of the following conditions:  Circulatory failure   Critical care was time spent personally by me on the following activities:  Discussions with consultants, examination of patient, obtaining history from patient or surrogate, ordering and review of laboratory studies, pulse oximetry, re-evaluation of patient's condition and review of old charts   (including critical care time)  Medications Ordered in ED Medications  0.9 %  sodium chloride infusion (Manually program via Guardrails IV Fluids) (has no administration in time range)  octreotide (SANDOSTATIN) 500 mcg in sodium chloride 0.9 % 250 mL (2 mcg/mL) infusion (50 mcg/hr Intravenous New Bag/Given 09/17/19 0446)  ondansetron (ZOFRAN) tablet 4 mg (has no administration in time range)    Or  ondansetron (ZOFRAN) injection 4 mg (has no administration in time range)  insulin aspart (novoLOG) injection 0-9 Units (has no administration in time range)  pantoprazole (PROTONIX) 80 mg in sodium chloride 0.9 % 100 mL IVPB (has no administration in time range)  pantoprazole (PROTONIX) 80 mg in sodium chloride 0.9 % 100 mL (0.8 mg/mL) infusion (has no  administration in time range)  pantoprazole (PROTONIX) injection 40 mg (has no administration in time range)  cefTRIAXone (ROCEPHIN) 2 g in sodium chloride 0.9 % 100 mL IVPB (has no administration in time range)  sodium chloride 0.9 % bolus 500 mL (0 mLs Intravenous Stopped 09/17/19 0447)    ED Course  I have reviewed the triage vital signs and the nursing notes.  Pertinent labs & imaging results that were available during my care of the patient were reviewed by me and considered in my medical decision making (see chart for details).    MDM Rules/Calculators/A&P                           Laboratory testing was done, patient has a port that was accessed.  She was given 500 cc bolus of fluid.  After reviewing patient CBC she was typed and crossed for 2 units to get 1 infused.  She has been symptomatic at home with lightheadedness and weakness when she tries to ambulate.  3:30 AM I talked to the patient about the drop in her hemoglobin from her usual 10 to 11 g down to 7.2 g.  I ordered 2 units of PRBCs, 1 to be transfused now.  I also consulted GI.  3:51 AM Dr. Michail Sermon, gastroenterologist on call for Dr. Therisa Doyne states they will see patient in the morning.  He wants her started on a octreotide drip.  He wants the hospitalist to admit.  4:07 AM Dr. Hal Hope, hospitalist will admit  Final Clinical Impression(s) / ED Diagnoses Final diagnoses:  Acute upper GI bleeding  Bleeding esophageal varices, unspecified esophageal varices type ([place])  Anemia, unspecified type    Rx / DC Orders   Plan admission  Rolland Porter, MD, Barbette Or, MD 09/17/19 [phone]

## 2019-09-17 NOTE — Op Note (Signed)
[facility] Patient Name: Keah Lamba Procedure Date: 09/17/2019 MRN: [id number] Attending MD: Arta Silence , MD Date of Birth: 25-Aug-1945 CSN: [id number] Age: 74 Admit Type: Inpatient Procedure:                Upper GI endoscopy Indications:              Acute post hemorrhagic anemia, Hematemesis, Melena,                            cirrhosis Providers:                Arta Silence, MD, Cleda Daub, RN, Tyna Jaksch Technician Referring MD:              Medicines:                General Anesthesia Complications:            No immediate complications. Estimated Blood Loss:     Estimated blood loss was minimal. Procedure:                Pre-Anesthesia Assessment:                           - Prior to the procedure, a History and Physical                            was performed, and patient medications and                            allergies were reviewed. The patient's tolerance of                            previous anesthesia was also reviewed. The risks                            and benefits of the procedure and the sedation                            options and risks were discussed with the patient.                            All questions were answered, and informed consent                            was obtained. Prior Anticoagulants: The patient has                            taken no previous anticoagulant or antiplatelet                            agents. ASA Grade Assessment: III - A patient with                            severe systemic disease.  After reviewing the risks                            and benefits, the patient was deemed in                            satisfactory condition to undergo the procedure.                           After obtaining informed consent, the endoscope was                            passed under direct vision. Throughout the                            procedure, the patient's blood pressure,  pulse, and                            oxygen saturations were monitored continuously. The                            GIF-H190 ([id number]) Olympus gastroscope was                            introduced through the mouth, and advanced to the                            second part of duodenum. The upper GI endoscopy was                            accomplished without difficulty. The patient                            tolerated the procedure well. Scope In: Scope Out: Findings:      Patchy post-banding therapy mucosal scarring was found in the middle       third of the esophagus and in the lower third of the esophagus.      Grade I varices were found in the lower third of the esophagus. They       were diminutive in size. Too small to band.      The exam of the esophagus was otherwise normal.      Red blood with clots, about 15-20 cc's, was found in the cardia and in       the gastric fundus. After suctioning out, no bleeding source seen.      Moderate portal hypertensive gastropathy was found in the entire       examined stomach.      Few diminutive antral erosions, without stigmata of bleeding, were seen.       The exam of the stomach was otherwise normal. No obvious gastric varices       were seen.      The duodenal bulb, first portion of the duodenum and second portion of       the duodenum were normal. Impression:               - Esophageal mucosal scarring, from prior  esophageal banding sessions.                           - Grade I esophageal varices.                           - Red blood in the cardia and in the gastric fundus.                           - Portal hypertensive gastropathy.                           - Normal duodenal bulb, first portion of the                            duodenum and second portion of the duodenum.                           - No obvious source of bleeding identified; no                            ongoing/active bleeding seen  during our exam;                            possible explanations for bleeding include:                            variceal bleeding (gastric or esophageal) that had                            resolved and varices flattened with medical therapy                            versus dieulafoy lesion versus portal gastropathy                            bleeding versus other. Moderate Sedation:      None Recommendation:           - Return patient to hospital ward for ongoing care.                           - Continue present medications, including                            octreotide and PPI infusions.                           - Clear liquid diet ok.                           [facility] will follow. Procedure Code(s):        --- Professional ---                           [phone], Esophagogastroduodenoscopy, flexible,  transoral; diagnostic, including collection of                            specimen(s) by brushing or washing, when performed                            (separate procedure) Diagnosis Code(s):        --- Professional ---                           K22.8, Other specified diseases of esophagus                           I85.00, Esophageal varices without bleeding                           K92.2, Gastrointestinal hemorrhage, unspecified                           K76.6, Portal hypertension                           K31.89, Other diseases of stomach and duodenum                           D62, Acute posthemorrhagic anemia                           K92.0, Hematemesis                           K92.1, Melena (includes Hematochezia) CPT copyright 2019 American Medical Association. All rights reserved. The codes documented in this report are preliminary and upon coder review may  be revised to meet current compliance requirements. Arta Silence, MD 09/17/2019 10:51:27 AM This report has been signed electronically. Number of Addenda: 0

## 2019-09-17 NOTE — H&P (Signed)
History and Physical    Olivia Benson LEX:[id number] DOB: 1945/06/21 DOA: 09/17/2019  PCP: Gregor Hams, FNP  Patient coming from: Home.  Chief Complaint: Throwing up blood.  HPI: Olivia Benson is a 74 y.o. 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  Patient states over the last 24 hours patient had been having melanotic stools.  Had some epigastric discomfort.  Has been a nonproductive cough.  Given that patient had frank hematemesis patient presents to the ER.  ED Course: In the ER patient is hemodynamically stable but started throwing up blood again at the time of my exam.  ER physician has notified on-call gastroenterologist Dr. Michail Sermon who will be seeing patient in consult.  Dr. Neldon Labella advised to start patient on octreotide infusion.  Patient's hemoglobin has dropped from 10 recently to 7.  WBC count is 28.9 hemoglobin is 9.2 blood glucose 314 creatinine 1.9 platelets 135.  Covid test is pending.  Review of Systems: As per HPI, rest all negative.   Past Medical History:  Diagnosis Date  . Asthma   . Cirrhosis ([place])   . Complication of anesthesia    slow to wake up / n & v  . Diabetes mellitus   . Elevated LFTs   . Family history of adverse reaction to anesthesia    multiple family members have had problems such as slow to wake / "flat lined" /  ventilator    . Frequency of urination   . Glaucoma, narrow-angle   . History of skin cancer   . Hypertension   . Non-ST elevated myocardial infarction (non-STEMI) [facility]) Sept 2012   Mild elevation in troponin and CK MB but no evidence of coronary disease at time of cath; per patient " i had undiagnosed heart event but not a heart attack"   . Obesity   . 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 Overactive bladder   . PONV (postoperative nausea and vomiting)    no problems with feb  egd   . Portal hypertension ([place])   . Restless leg syndrome   . Tingling    hands and feet    Past Surgical History:  Procedure Laterality Date  . ABDOMINAL ULTRASOUND  Sept 2012   No gallbladder disease, + fatty liver  . CARDIAC CATHETERIZATION  10/25/2010   EF 60%; Normal coronaries  . COLONOSCOPY  10/18/2005  . COLONOSCOPY WITH PROPOFOL N/A 09/28/2018   Procedure: COLONOSCOPY WITH PROPOFOL;  Surgeon: Ronnette Juniper, MD;  Location: [facility];  Service: Gastroenterology;  Laterality: N/A;  . COSMETIC SURGERY  2003  . DENTAL SURGERY    . ESOPHAGEAL BANDING  02/10/2018   Procedure: ESOPHAGEAL BANDING;  Surgeon: Ronnette Juniper, MD;  Location: [facility];  Service: Gastroenterology;;  . ESOPHAGEAL BANDING N/A 03/23/2018   Procedure: ESOPHAGEAL BANDING;  Surgeon: Ronnette Juniper, MD;  Location: [facility];  Service: Gastroenterology;  Laterality: N/A;  . ESOPHAGEAL BANDING N/A 09/28/2018   Procedure: ESOPHAGEAL BANDING;  Surgeon: Ronnette Juniper, MD;  Location: [facility];  Service: Gastroenterology;  Laterality: N/A;  . ESOPHAGEAL BANDING  03/01/2019   Procedure: ESOPHAGEAL BANDING;  Surgeon: Ronnette Juniper, MD;  Location: [facility];  Service: Gastroenterology;;  . ESOPHAGOGASTRODUODENOSCOPY N/A 03/23/2018   Procedure: ESOPHAGOGASTRODUODENOSCOPY (EGD);  Surgeon: Ronnette Juniper, MD;  Location: [facility];  Service: Gastroenterology;  Laterality: N/A;  . ESOPHAGOGASTRODUODENOSCOPY (EGD) WITH PROPOFOL N/A 02/10/2018   Procedure: ESOPHAGOGASTRODUODENOSCOPY (EGD) WITH PROPOFOL;  Surgeon: Ronnette Juniper, MD;  [facility];  Service: Gastroenterology;  Laterality: N/A;  . ESOPHAGOGASTRODUODENOSCOPY (EGD) WITH PROPOFOL N/A 09/28/2018   Procedure: ESOPHAGOGASTRODUODENOSCOPY (EGD) WITH PROPOFOL;  Surgeon:  Ronnette Juniper, MD;  [facility];  Service: Gastroenterology;  Laterality: N/A;  . ESOPHAGOGASTRODUODENOSCOPY (EGD) WITH PROPOFOL N/A 03/01/2019   Procedure: ESOPHAGOGASTRODUODENOSCOPY (EGD) WITH PROPOFOL with banding;  Surgeon: Ronnette Juniper, MD;  [facility]: [facility];  Service: Gastroenterology;  Laterality: N/A;  . ESOPHAGOGASTRODUODENOSCOPY (EGD) WITH PROPOFOL N/A 05/24/2019   Procedure: ESOPHAGOGASTRODUODENOSCOPY (EGD) WITH PROPOFOL;  Surgeon: Ronnette Juniper, MD;  Location: [facility];  Service: Gastroenterology;  Laterality: N/A;  . EYE SURGERY Bilateral 09/19/14 R eye, 08/2014 L eye   open angles in both eyes d/t narrow angle glaucoma  . GASTRIC VARICES BANDING N/A 05/24/2019   Procedure: GASTRIC VARICES BANDING;  Surgeon: Ronnette Juniper, MD;  Location: [facility];  Service: Gastroenterology;  Laterality: N/A;  . LAPAROTOMY N/A 03/29/2014   Procedure: EXPLORATORY LAPAROTOMY;  Surgeon: Everitt Amber, MD;  Location: [facility];  Service: Gynecology;  Laterality: N/A;  . ORIF ANKLE FRACTURE Right 03/09/2018   Procedure: OPEN REDUCTION INTERNAL FIXATION (ORIF) RIGHT LATERAL MALLEOLUS;  Surgeon: Leandrew Koyanagi, MD;  Location: [place];  Service: Orthopedics;  Laterality: Right;  . PARTIAL HYSTERECTOMY  1988  . POLYPECTOMY  09/28/2018   Procedure: POLYPECTOMY;  Surgeon: Ronnette Juniper, MD;  Location: [facility];  Service: Gastroenterology;;  . SALPINGOOPHORECTOMY Bilateral 03/29/2014   Procedure: BILATERAL SALPINGO OOPHORECTOMY, OMENTECTOMY, DEBULKING;  Surgeon: Everitt Amber, MD;  Location: [facility];  Service: Gynecology;  Laterality: Bilateral;  . TONSILLECTOMY  1971  . TUBAL LIGATION    . [facility]  11/24/2009     reports that she quit smoking about 37 years ago. Her smoking use included cigarettes. She has a 1.00 pack-year smoking history. She has never used smokeless tobacco. She reports that she does not drink alcohol and does not use drugs.  Allergies  Allergen Reactions  . Carboplatin Anaphylaxis   . Shellfish Allergy Anaphylaxis    HER THROAT SWELLS UP AND CLOSES  . Vancomycin Rash    Pt developed a large red rash covering her entire arm after infusion. Suspected red-man syndrome vs infiltration  . Benadryl [Diphenhydramine Hcl] Other (See Comments)    Restless legs   . Penicillins Rash    DID THE REACTION INVOLVE: Swelling of the face/tongue/throat, SOB, or low BP? No Sudden or severe rash/hives, skin peeling, or the inside of the mouth or nose? No Did it require medical treatment? No When did it last happen?Childhood allergy If all above answers are "NO", may proceed with cephalosporin use.     Family History  Problem Relation Age of Onset  . Asthma Mother   . Diabetes Mother   . Hypertension Mother   . Allergies Mother   . Rheum arthritis Mother   . Bladder Cancer Father 32       former heavy smoker  . Hypertension Father   . Stroke Father   . Breast cancer Sister 49  . Diabetes Sister        x 3  . Allergies Sister   . Asthma Sister        x 2   . Breast cancer Maternal  Aunt 85  . Breast cancer Cousin 66       (maternal) bilateral cancer  . Aneurysm Son 42  . Diabetes Brother   . Allergies Brother   . Asthma Brother     Prior to Admission medications   Medication Sig Start Date End Date Taking? Authorizing Provider  cholecalciferol (VITAMIN D) 1000 units tablet Take 1,000 Units by mouth at bedtime.    Yes [provider]  dexamethasone (DECADRON) 4 MG tablet Start the day before Taxotere, take 1 tablet in the morning, and then 1 tablet daily after chemo for 2 days. Pls dispense 30 tabs Patient taking differently: Take 4 mg by mouth See admin instructions. Start the day before Taxotere, take 1 tablet in the morning, and then 1 tablet daily after chemo for 2 days. Pls dispense 30 tabs 12/15/18  Yes Gorsuch, Ni, MD  gabapentin (NEURONTIN) 300 MG capsule Take 3 capsules (900 mg total) by mouth 2 (two) times daily. 03/02/19  Yes Gorsuch, Ni, MD    HUMALOG MIX 75/25 KWIKPEN (75-25) 100 UNIT/ML Kwikpen Inject 22 Units into the skin 2 (two) times daily. Before breakfast & before supper Except when taking Chemo take 32 units  For three to five days 02/19/19  Yes [provider]  HYDROcodone-acetaminophen (NORCO/VICODIN) 5-325 MG tablet Take 1 tablet by mouth every 6 (six) hours as needed for moderate pain. 08/17/19  Yes Gorsuch, Ni, MD  lidocaine-prilocaine (EMLA) cream Apply to Porta-cath  1-2 hours prior to access as directed. Patient taking differently: Apply 1 application topically daily as needed (port access).  11/13/17  Yes Gorsuch, Ni, MD  nadolol (CORGARD) 20 MG tablet Take 1 tablet (20 mg total) by mouth daily. Patient taking differently: Take 20 mg by mouth at bedtime.  03/23/18 09/17/19 Yes Ronnette Juniper, MD  ondansetron (ZOFRAN) 8 MG tablet Take 1 tablet (8 mg total) by mouth every 8 (eight) hours as needed. Start on the third day after chemotherapy. Patient taking differently: Take 8 mg by mouth every 8 (eight) hours as needed for nausea. Start on the third day after chemotherapy. 09/18/18  Yes Gorsuch, Ni, MD  pantoprazole (PROTONIX) 40 MG tablet Take 1 tablet (40 mg total) by mouth daily. 07/09/18  Yes [name], MD  PROAIR HFA 108 (90 Base) MCG/ACT inhaler Inhale 2 puffs into the lungs every 4 (four) hours as needed for wheezing or shortness of breath.  11/24/18  Yes [provider]  prochlorperazine (COMPAZINE) 10 MG tablet Take 1 tablet (10 mg total) by mouth every 6 (six) hours as needed (Nausea or vomiting). 09/18/18  Yes Gorsuch, Ni, MD  rotigotine (NEUPRO) 4 MG/24HR Place 1 patch onto the skin daily. 04/06/19  Yes Tat, Eustace Quail, DO    Physical Exam: Constitutional: Moderately built and nourished. Vitals:   09/17/19 0155 09/17/19 0200 09/17/19 0300 09/17/19 0400  BP: 114/62 (!) 116/58 116/69 140/75  Pulse: 79 75 76 77  Resp: 17 16 16 16   Temp: 98 F (36.7 C)     TempSrc: Oral     SpO2: 98% 94% 95% 100%   Weight: 88.5 kg     Height: 5' 2"  (1.575 m)      Eyes: Anicteric no pallor. ENMT: No discharge from the ears eyes nose or mouth. Neck: No mass felt.  No neck rigidity. Respiratory: No rhonchi or crepitations. Cardiovascular: S1-S2 heard. Abdomen: Soft nontender bowel sounds present. Musculoskeletal: No edema. Skin: No rash. Neurologic: Alert awake oriented to time place and person.  Moves all extremities. Psychiatric: Appears normal.  Normal affect.   Labs on Admission: I have personally reviewed following labs and imaging studies  CBC: Recent Labs  Lab 09/17/19 0224  WBC 28.9*  NEUTROABS 23.4*  HGB 7.2*  HCT 23.0*  MCV 99.6  PLT 270*   Basic Metabolic Panel: Recent Labs  Lab 09/17/19 0224  NA 136  K 4.8  CL 106  CO2 21*  GLUCOSE 314*  BUN 51*  CREATININE 0.99  CALCIUM 8.0*   GFR: Estimated Creatinine Clearance: 53.9 mL/min (by C-G formula based on SCr of 0.99 mg/dL). Liver Function Tests: Recent Labs  Lab 09/17/19 0224  AST 22  ALT 24  ALKPHOS 139*  BILITOT 0.9  PROT 5.4*  ALBUMIN 3.3*   No results for input(s): LIPASE, AMYLASE in the last 168 hours. No results for input(s): AMMONIA in the last 168 hours. Coagulation Profile: Recent Labs  Lab 09/17/19 0224  INR 1.4*   Cardiac Enzymes: No results for input(s): CKTOTAL, CKMB, CKMBINDEX, TROPONINI in the last 168 hours. BNP (last 3 results) No results for input(s): PROBNP in the last 8760 hours. HbA1C: No results for input(s): HGBA1C in the last 72 hours. CBG: No results for input(s): GLUCAP in the last 168 hours. Lipid Profile: No results for input(s): CHOL, HDL, LDLCALC, TRIG, CHOLHDL, LDLDIRECT in the last 72 hours. Thyroid Function Tests: No results for input(s): TSH, T4TOTAL, FREET4, T3FREE, THYROIDAB in the last 72 hours. Anemia Panel: No results for input(s): VITAMINB12, FOLATE, FERRITIN, TIBC, IRON, RETICCTPCT in the last 72 hours. Urine analysis:    Component Value Date/Time    COLORURINE YELLOW 01/02/2019 2029   APPEARANCEUR CLEAR 01/02/2019 2029   LABSPEC 1.017 01/02/2019 2029   PHURINE 5.0 01/02/2019 2029   GLUCOSEU 50 (A) 01/02/2019 2029   HGBUR SMALL (A) 01/02/2019 2029   BILIRUBINUR NEGATIVE 01/02/2019 2029   [place] NEGATIVE 01/02/2019 2029   PROTEINUR 30 (A) 01/02/2019 2029   UROBILINOGEN 0.2 03/22/2014 0917   NITRITE NEGATIVE 01/02/2019 2029   LEUKOCYTESUR NEGATIVE 01/02/2019 2029   Sepsis Labs: @LABRCNTIP (procalcitonin:4,lacticidven:4) )No results found for this or any previous visit (from the past 240 hour(s)).   Radiological Exams on Admission: No results found.   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 Acute GI bleeding with history of varices likely source of bleeding.  Dr. Nicki Reaper on-call gastroenterologist has been notified.  Dr. Neldon Labella advised patient to be started on octreotide infusion.  Will be kept n.p.o. in anticipation of procedure.  Given the history of liver cirrhosis will start patient on ceftriaxone for SBP coverage.  Protonix infusion.  1 unit of PRBC transfusion has been ordered. 2. Acute blood loss anemia -receiving 1 unit of PRBC transfusion.  Follow CBC after transfusion. 3. Diabetes mellitus type 2 uncontrolled with hyperglycemia -patient had not taken her insulin today.  We will keep patient on sliding scale coverage for now.  Follow CBGs closely. 4. Leukocytosis could be from Rotigotine and also could be reactionary.  Follow closely.  Patient is on empiric antibiotics for SBP coverage. 5. Primary progressive peritoneal carcinomatosis per oncology. 6. Thrombocytopenia chemotherapy-induced.  Since patient is having acute GI bleed will need close monitoring for  any further worsening and inpatient status.  Covid test is pending.  DVT prophylaxis: SCDs.  Since patient has active GI bleed avoiding anticoagulation. Code Status: Full code. Family Communication: Discussed with patient. Disposition Plan: Home. Consults called: Copywriter, advertising. Admission status: Inpatient.   Rise Patience MD [facility] Pager [phone].  If 7PM-7AM, please contact night-coverage [email or web address] Password [employer]  09/17/2019, 4:38 AM

## 2019-09-17 NOTE — H&P (View-Only) (Signed)
[facility] Consultation Note  Referring Provider: [facility] Primary Care Physician:  Gregor Hams, FNP Primary Gastroenterologist:  Dr. Ronnette Juniper  Reason for Consultation:  GI bleeding  HPI: Olivia Benson is a 74 y.o. female with multiple medical problems including metastatic ovarian cancer and NASH-mediated cirrhosis.  Prior history of GI bleeding requiring esophageal variceal band ligation, last in April of this year.  She has had a few days of black stools and a couple days of hematemesis, 7-8 episodes over the past 24 hours and last episode about one hour ago. She has upper abdominal discomfort, now and intermittently as her baseline.     Past Medical History:  Diagnosis Date  . Asthma   . Cirrhosis ([place])   . Complication of anesthesia    slow to wake up / n & v  . Diabetes mellitus   . Elevated LFTs   . Family history of adverse reaction to anesthesia    multiple family members have had problems such as slow to wake / "flat lined" /  ventilator    . Frequency of urination   . Glaucoma, narrow-angle   . History of skin cancer   . Hypertension   . Non-ST elevated myocardial infarction (non-STEMI) [facility]) Sept 2012   Mild elevation in troponin and CK MB but no evidence of coronary disease at time of cath; per patient " i had undiagnosed heart event but not a heart attack"   . Obesity   . Ovarian cancer ([place]) 11/22/2013   Disseminated ovarian cancer; 09-25-2018   "my cancer is back and im restarting chemo on tuesday 09-25-2018]  . Overactive bladder   . PONV (postoperative nausea and vomiting)    no problems with feb  egd   . Portal hypertension ([place])   . Restless leg syndrome   . Tingling    hands and feet    Past Surgical History:  Procedure Laterality Date  . ABDOMINAL ULTRASOUND  Sept 2012   No gallbladder disease, + fatty liver  . CARDIAC CATHETERIZATION  10/25/2010   EF 60%; Normal coronaries  . COLONOSCOPY  10/18/2005  . COLONOSCOPY WITH  PROPOFOL N/A 09/28/2018   Procedure: COLONOSCOPY WITH PROPOFOL;  Surgeon: Ronnette Juniper, MD;  Location: [facility];  Service: Gastroenterology;  Laterality: N/A;  . COSMETIC SURGERY  2003  . DENTAL SURGERY    . ESOPHAGEAL BANDING  02/10/2018   Procedure: ESOPHAGEAL BANDING;  Surgeon: Ronnette Juniper, MD;  Location: [facility];  Service: Gastroenterology;;  . ESOPHAGEAL BANDING N/A 03/23/2018   Procedure: ESOPHAGEAL BANDING;  Surgeon: Ronnette Juniper, MD;  Location: [facility];  Service: Gastroenterology;  Laterality: N/A;  . ESOPHAGEAL BANDING N/A 09/28/2018   Procedure: ESOPHAGEAL BANDING;  Surgeon: Ronnette Juniper, MD;  Location: [facility];  Service: Gastroenterology;  Laterality: N/A;  . ESOPHAGEAL BANDING  03/01/2019   Procedure: ESOPHAGEAL BANDING;  Surgeon: Ronnette Juniper, MD;  Location: [facility];  Service: Gastroenterology;;  . ESOPHAGOGASTRODUODENOSCOPY N/A 03/23/2018   Procedure: ESOPHAGOGASTRODUODENOSCOPY (EGD);  Surgeon: Ronnette Juniper, MD;  Location: [facility];  Service: Gastroenterology;  Laterality: N/A;  . ESOPHAGOGASTRODUODENOSCOPY (EGD) WITH PROPOFOL N/A 02/10/2018   Procedure: ESOPHAGOGASTRODUODENOSCOPY (EGD) WITH PROPOFOL;  Surgeon: Ronnette Juniper, MD;  Location: [facility];  Service: Gastroenterology;  Laterality: N/A;  . ESOPHAGOGASTRODUODENOSCOPY (EGD) WITH PROPOFOL N/A 09/28/2018   Procedure: ESOPHAGOGASTRODUODENOSCOPY (EGD) WITH PROPOFOL;  Surgeon: Ronnette Juniper, MD;  Location: [facility];  Service: Gastroenterology;  Laterality: N/A;  . ESOPHAGOGASTRODUODENOSCOPY (EGD) WITH PROPOFOL N/A 03/01/2019   Procedure: ESOPHAGOGASTRODUODENOSCOPY (  EGD) WITH PROPOFOL with banding;  Surgeon: Ronnette Juniper, MD;  Location: [facility];  Service: Gastroenterology;  Laterality: N/A;  . ESOPHAGOGASTRODUODENOSCOPY (EGD) WITH PROPOFOL N/A 05/24/2019   Procedure: ESOPHAGOGASTRODUODENOSCOPY (EGD) WITH PROPOFOL;  Surgeon: Ronnette Juniper, MD;  Location: [facility];  Service: Gastroenterology;  Laterality:  N/A;  . EYE SURGERY Bilateral 09/19/14 R eye, 08/2014 L eye   open angles in both eyes d/t narrow angle glaucoma  . GASTRIC VARICES BANDING N/A 05/24/2019   Procedure: GASTRIC VARICES BANDING;  Surgeon: Ronnette Juniper, MD;  Location: [facility];  Service: Gastroenterology;  Laterality: N/A;  . LAPAROTOMY N/A 03/29/2014   Procedure: EXPLORATORY LAPAROTOMY;  Surgeon: Everitt Amber, MD;  Location: [facility];  Service: Gynecology;  Laterality: N/A;  . ORIF ANKLE FRACTURE Right 03/09/2018   Procedure: OPEN REDUCTION INTERNAL FIXATION (ORIF) RIGHT LATERAL MALLEOLUS;  Surgeon: Leandrew Koyanagi, MD;  Location: [facility];  Service: Orthopedics;  Laterality: Right;  . PARTIAL HYSTERECTOMY  1988  . POLYPECTOMY  09/28/2018   Procedure: POLYPECTOMY;  Surgeon: Ronnette Juniper, MD;  Location: [facility];  Service: Gastroenterology;;  . SALPINGOOPHORECTOMY Bilateral 03/29/2014   Procedure: BILATERAL SALPINGO OOPHORECTOMY, OMENTECTOMY, DEBULKING;  Surgeon: Everitt Amber, MD;  Location: [facility];  Service: Gynecology;  Laterality: Bilateral;  . TONSILLECTOMY  1971  . TUBAL LIGATION    . [facility]  11/24/2009    Prior to Admission medications   Medication Sig Start Date End Date Taking? Authorizing Provider  cholecalciferol (VITAMIN D) 1000 units tablet Take 1,000 Units by mouth at bedtime.    Yes [provider]  dexamethasone (DECADRON) 4 MG tablet Start the day before Taxotere, take 1 tablet in the morning, and then 1 tablet daily after chemo for 2 days. Pls dispense 30 tabs Patient taking differently: Take 4 mg by mouth See admin instructions. Start the day before Taxotere, take 1 tablet in the morning, and then 1 tablet daily after chemo for 2 days. Pls dispense 30 tabs 12/15/18  Yes Gorsuch, Ni, MD  gabapentin (NEURONTIN) 300 MG capsule Take 3 capsules (900 mg total) by mouth 2 (two) times daily. 03/02/19  Yes Gorsuch, Ni, MD  HUMALOG MIX 75/25 KWIKPEN (75-25) 100 UNIT/ML Kwikpen Inject 22 Units into the skin 2 (two)  times daily. Before breakfast & before supper Except when taking Chemo take 32 units  For three to five days 02/19/19  Yes [provider]  HYDROcodone-acetaminophen (NORCO/VICODIN) 5-325 MG tablet Take 1 tablet by mouth every 6 (six) hours as needed for moderate pain. 08/17/19  Yes Gorsuch, Ni, MD  lidocaine-prilocaine (EMLA) cream Apply to Porta-cath  1-2 hours prior to access as directed. Patient taking differently: Apply 1 application topically daily as needed (port access).  11/13/17  Yes Gorsuch, Ni, MD  nadolol (CORGARD) 20 MG tablet Take 1 tablet (20 mg total) by mouth daily. Patient taking differently: Take 20 mg by mouth at bedtime.  03/23/18 09/17/19 Yes Ronnette Juniper, MD  ondansetron (ZOFRAN) 8 MG tablet Take 1 tablet (8 mg total) by mouth every 8 (eight) hours as needed. Start on the third day after chemotherapy. Patient taking differently: Take 8 mg by mouth every 8 (eight) hours as needed for nausea. Start on the third day after chemotherapy. 09/18/18  Yes Gorsuch, Ni, MD  pantoprazole (PROTONIX) 40 MG tablet Take 1 tablet (40 mg total) by mouth daily. 07/09/18  Yes [name], MD  PROAIR HFA 108 (90 Base) MCG/ACT inhaler Inhale 2 puffs into the lungs every 4 (four) hours as needed  for wheezing or shortness of breath.  11/24/18  Yes [provider]  prochlorperazine (COMPAZINE) 10 MG tablet Take 1 tablet (10 mg total) by mouth every 6 (six) hours as needed (Nausea or vomiting). 09/18/18  Yes Gorsuch, Ni, MD  rotigotine (NEUPRO) 4 MG/24HR Place 1 patch onto the skin daily. 04/06/19  Yes Tat, Eustace Quail, DO    Current Facility-Administered Medications  Medication Dose Route Frequency Provider Last Rate Last Admin  . 0.9 %  sodium chloride infusion (Manually program via Guardrails IV Fluids)   Intravenous Once Rise Patience, MD      . cefTRIAXone (ROCEPHIN) 2 g in sodium chloride 0.9 % 100 mL IVPB  2 g Intravenous Q24H Rise Patience, MD 200 mL/hr at 09/17/19 0736 2 g at  09/17/19 0736  . insulin aspart (novoLOG) injection 0-9 Units  0-9 Units Subcutaneous Q4H Rise Patience, MD   5 Units at 09/17/19 [phone]  . insulin glargine (LANTUS) injection 10 Units  10 Units Subcutaneous Daily Amin, Ankit Chirag, MD      . octreotide (SANDOSTATIN) 500 mcg in sodium chloride 0.9 % 250 mL (2 mcg/mL) infusion  50 mcg/hr Intravenous Continuous Rise Patience, MD 25 mL/hr at 09/17/19 0446 50 mcg/hr at 09/17/19 0446  . ondansetron (ZOFRAN) tablet 4 mg  4 mg Oral Q6H PRN Rise Patience, MD   4 mg at 09/17/19 0617   Or  . ondansetron (ZOFRAN) injection 4 mg  4 mg Intravenous Q6H PRN Rise Patience, MD      . pantoprazole (PROTONIX) 80 mg in sodium chloride 0.9 % 100 mL (0.8 mg/mL) infusion  8 mg/hr Intravenous Continuous Rise Patience, MD      . pantoprazole (PROTONIX) 80 mg in sodium chloride 0.9 % 100 mL IVPB  80 mg Intravenous Once Rise Patience, MD      . Derrill Memo ON 09/20/2019] pantoprazole (PROTONIX) injection 40 mg  40 mg Intravenous Q12H Rise Patience, MD       Current Outpatient Medications  Medication Sig Dispense Refill  . cholecalciferol (VITAMIN D) 1000 units tablet Take 1,000 Units by mouth at bedtime.     Marland Kitchen dexamethasone (DECADRON) 4 MG tablet Start the day before Taxotere, take 1 tablet in the morning, and then 1 tablet daily after chemo for 2 days. Pls dispense 30 tabs (Patient taking differently: Take 4 mg by mouth See admin instructions. Start the day before Taxotere, take 1 tablet in the morning, and then 1 tablet daily after chemo for 2 days. Pls dispense 30 tabs) 30 tablet 1  . gabapentin (NEURONTIN) 300 MG capsule Take 3 capsules (900 mg total) by mouth 2 (two) times daily. 120 capsule 11  . HUMALOG MIX 75/25 KWIKPEN (75-25) 100 UNIT/ML Kwikpen Inject 22 Units into the skin 2 (two) times daily. Before breakfast & before supper Except when taking Chemo take 32 units  For three to five days    . HYDROcodone-acetaminophen  (NORCO/VICODIN) 5-325 MG tablet Take 1 tablet by mouth every 6 (six) hours as needed for moderate pain. 60 tablet 0  . lidocaine-prilocaine (EMLA) cream Apply to Porta-cath  1-2 hours prior to access as directed. (Patient taking differently: Apply 1 application topically daily as needed (port access). ) 30 g 9  . nadolol (CORGARD) 20 MG tablet Take 1 tablet (20 mg total) by mouth daily. (Patient taking differently: Take 20 mg by mouth at bedtime. ) 90 tablet 3  . ondansetron (ZOFRAN) 8 MG tablet Take  1 tablet (8 mg total) by mouth every 8 (eight) hours as needed. Start on the third day after chemotherapy. (Patient taking differently: Take 8 mg by mouth every 8 (eight) hours as needed for nausea. Start on the third day after chemotherapy.) 30 tablet 1  . pantoprazole (PROTONIX) 40 MG tablet Take 1 tablet (40 mg total) by mouth daily.    Marland Kitchen PROAIR HFA 108 (90 Base) MCG/ACT inhaler Inhale 2 puffs into the lungs every 4 (four) hours as needed for wheezing or shortness of breath.     . prochlorperazine (COMPAZINE) 10 MG tablet Take 1 tablet (10 mg total) by mouth every 6 (six) hours as needed (Nausea or vomiting). 30 tablet 1  . rotigotine (NEUPRO) 4 MG/24HR Place 1 patch onto the skin daily. 30 patch 5    Allergies as of 09/17/2019 - Review Complete 09/17/2019  Allergen Reaction Noted  . Carboplatin Anaphylaxis 11/24/2018  . Shellfish allergy Anaphylaxis 10/22/2012  . Vancomycin Rash 12/31/2018  . Benadryl [diphenhydramine hcl] Other (See Comments) 02/21/2014  . Penicillins Rash 11/06/2010    Family History  Problem Relation Age of Onset  . Asthma Mother   . Diabetes Mother   . Hypertension Mother   . Allergies Mother   . Rheum arthritis Mother   . Bladder Cancer Father 59       former heavy smoker  . Hypertension Father   . Stroke Father   . Breast cancer Sister 10  . Diabetes Sister        x 3  . Allergies Sister   . Asthma Sister        x 2   . Breast cancer Maternal Aunt 85  .  Breast cancer Cousin 48       (maternal) bilateral cancer  . Aneurysm Son 49  . Diabetes Brother   . Allergies Brother   . Asthma Brother     Social History   Socioeconomic History  . Marital status: Married    Spouse name: Not on file  . Number of children: 1 D  . Years of education: Not on file  . Highest education level: Not on file  Occupational History  . Occupation: Retired  Tobacco Use  . Smoking status: Former Smoker    Packs/day: 1.00    Years: 1.00    Pack years: 1.00    Types: Cigarettes    Quit date: 02/11/1982    Years since quitting: 37.6  . Smokeless tobacco: Never Used  Substance and Sexual Activity  . Alcohol use: No    Alcohol/week: 0.0 standard drinks  . Drug use: No  . Sexual activity: Not on file  Other Topics Concern  . Not on file  Social History Narrative   Left handed   Lives in single story home with husband   Social Determinants of Health   Financial Resource Strain:   . Difficulty of Paying Living Expenses:   Food Insecurity:   . Worried About Charity fundraiser in the Last Year:   . Arboriculturist in the Last Year:   Transportation Needs:   . Film/video editor (Medical):   Marland Kitchen Lack of Transportation (Non-Medical):   Physical Activity:   . Days of Exercise per Week:   . Minutes of Exercise per Session:   Stress:   . Feeling of Stress :   Social Connections:   . Frequency of Communication with Friends and Family:   . Frequency of Social Gatherings with Friends and  Family:   . Attends Religious Services:   . Active Member of Clubs or Organizations:   . Attends Archivist Meetings:   Marland Kitchen Marital Status:   Intimate Partner Violence:   . Fear of Current or Ex-Partner:   . Emotionally Abused:   Marland Kitchen Physically Abused:   . Sexually Abused:     Review of Systems: As per HPI, all others negative  Physical Exam: Vital signs in last 24 hours: Temp:  [98 F (36.7 C)] 98 F (36.7 C) (08/06 0155) Pulse Rate:  [75-94] 76  (08/06 0700) Resp:  [15-17] 16 (08/06 0700) BP: (93-140)/(54-78) 131/78 (08/06 0700) SpO2:  [94 %-100 %] 100 % (08/06 0700) Weight:  [88.5 kg] 88.5 kg (08/06 0155)   General:   Alert,  overweight, pleasant and cooperative in NAD Head:  Normocephalic and atraumatic. Eyes:  Sclera clear, no icterus.   Conjunctiva pink. Ears:  Normal auditory acuity. Nose:  No deformity, discharge,  or lesions. Mouth:  No deformity or lesions.  Oropharynx pink & moist. Neck:  Supple; no masses or thyromegaly. Abdomen:  Soft, protuberant, mild tenderness without peritonitis, nondistended. No masses, hepatosplenomegaly or hernias noted. Normal bowel sounds, without guarding, and without rebound.     Msk:  Symmetrical without gross deformities. Normal posture. Pulses:  Normal pulses noted. Extremities:  Without clubbing or edema. Neurologic:  Alert and  oriented x4;  grossly normal neurologically. Skin:  Intact without significant lesions or rashes. Psych:  Alert and cooperative. Normal mood and affect.   Lab Results: Recent Labs    09/17/19 0224  WBC 28.9*  HGB 7.2*  HCT 23.0*  PLT 135*   BMET Recent Labs    09/17/19 0224  NA 136  K 4.8  CL 106  CO2 21*  GLUCOSE 314*  BUN 51*  CREATININE 0.99  CALCIUM 8.0*   LFT Recent Labs    09/17/19 0224  PROT 5.4*  ALBUMIN 3.3*  AST 22  ALT 24  ALKPHOS 139*  BILITOT 0.9   PT/INR Recent Labs    09/17/19 0224  LABPROT 17.0*  INR 1.4*    Studies/Results: No results found.  Impression:  1.  Hematemesis.  Melena. 2.  Upper abdominal discomfort. 3.  Cirrhosis, fatty liver mediated, complicated by prior variceal bleeds.  Plan:  1.  Octreotide, PPI, supportive care including resuscitation. 2.  NPO. 3.  EGD today.  Patient is aware of, and has experienced previously, chest pain and odynophagia after esophageal banding, for which sucralfate suspension has been prescribed. 4.  Risks (bleeding, infection, bowel perforation that could  require surgery, sedation-related changes in cardiopulmonary systems), benefits (identification and possible treatment of source of symptoms, exclusion of certain causes of symptoms), and alternatives (watchful waiting, radiographic imaging studies, empiric medical treatment) of upper endoscopy (EGD) were explained to patient/family in detail and patient wishes to proceed.   LOS: 0 days   Harlee Eckroth M  09/17/2019, 8:56 AM  Cell [phone] If no answer or after 5 PM call [phone]

## 2019-09-17 NOTE — Anesthesia Procedure Notes (Signed)
Procedure Name: Intubation Date/Time: 09/17/2019 10:05 AM Performed by: Deliah Boston, CRNA Pre-anesthesia Checklist: Patient identified, Emergency Drugs available, Suction available and Patient being monitored Patient Re-evaluated:Patient Re-evaluated prior to induction Oxygen Delivery Method: Circle system utilized Preoxygenation: Pre-oxygenation with 100% oxygen Induction Type: IV induction, Rapid sequence and Cricoid Pressure applied Laryngoscope Size: Mac and 3 Grade View: Grade II Tube type: Oral Number of attempts: 1 Airway Equipment and Method: Stylet and Oral airway Placement Confirmation: ETT inserted through vocal cords under direct vision,  positive ETCO2 and breath sounds checked- equal and bilateral Secured at: 22 cm Tube secured with: Tape Dental Injury: Teeth and Oropharynx as per pre-operative assessment

## 2019-09-17 NOTE — Anesthesia Preprocedure Evaluation (Addendum)
Anesthesia Evaluation  Patient identified by MRN, date of birth, ID band Patient awake    Reviewed: Allergy & Precautions, NPO status , Patient's Chart, lab work & pertinent test results, reviewed documented beta blocker date and time   History of Anesthesia Complications (+) PONV  Airway Mallampati: III  TM Distance: >3 FB Neck ROM: Full    Dental no notable dental hx. (+) Teeth Intact, Dental Advisory Given   Pulmonary asthma , former smoker,    Pulmonary exam normal breath sounds clear to auscultation       Cardiovascular hypertension, Pt. on home beta blockers and Pt. 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   Endo/Other  negative endocrine ROSdiabetes  Renal/GU negative Renal ROS   Ovarian CA currently receiving chemo negative genitourinary   Musculoskeletal negative musculoskeletal ROS (+)   Abdominal   Peds  Hematology  (+) Blood dyscrasia (Hgb 7.2, plt 135), anemia ,   Anesthesia Other Findings Acute GI bleeding with history of varices likely source of bleeding s/p 1U RBCs  Reproductive/Obstetrics                            Anesthesia Physical Anesthesia Plan  ASA: III  Anesthesia Plan: General   Post-op Pain Management:    Induction: Intravenous and Rapid sequence  PONV Risk Score and Plan: 4 or greater and Dexamethasone, Ondansetron and Treatment may vary due to age or medical condition  Airway Management Planned: Oral ETT  Additional Equipment:   Intra-op Plan:   Post-operative Plan: Extubation in OR  Informed Consent: I have reviewed the patients History and Physical, chart, labs and discussed the procedure  including the risks, benefits and alternatives for the proposed anesthesia with the patient or authorized representative who has indicated his/her understanding and acceptance.     Dental advisory given  Plan Discussed with: CRNA  Anesthesia Plan Comments:         Anesthesia Quick Evaluation

## 2019-09-18 LAB — MRSA PCR SCREENING: MRSA by PCR: NEGATIVE

## 2019-09-18 LAB — CBC
HCT: 18.8 % — ABNORMAL LOW (ref 36.0–46.0)
Hemoglobin: 5.7 g/dL — CL (ref 12.0–15.0)
MCH: 31 pg (ref 26.0–34.0)
MCHC: 30.3 g/dL (ref 30.0–36.0)
MCV: 102.2 fL — ABNORMAL HIGH (ref 80.0–100.0)
Platelets: 122 10*3/uL — ABNORMAL LOW (ref 150–400)
RBC: 1.84 MIL/uL — ABNORMAL LOW (ref 3.87–5.11)
RDW: 19.6 % — ABNORMAL HIGH (ref 11.5–15.5)
WBC: 31.2 10*3/uL — ABNORMAL HIGH (ref 4.0–10.5)
nRBC: 0.4 % — ABNORMAL HIGH (ref 0.0–0.2)

## 2019-09-18 LAB — COMPREHENSIVE METABOLIC PANEL
ALT: 40 U/L (ref 0–44)
AST: 34 U/L (ref 15–41)
Albumin: 3.1 g/dL — ABNORMAL LOW (ref 3.5–5.0)
Alkaline Phosphatase: 140 U/L — ABNORMAL HIGH (ref 38–126)
Anion gap: 12 (ref 5–15)
BUN: 70 mg/dL — ABNORMAL HIGH (ref 8–23)
CO2: 17 mmol/L — ABNORMAL LOW (ref 22–32)
Calcium: 6.9 mg/dL — ABNORMAL LOW (ref 8.9–10.3)
Chloride: 107 mmol/L (ref 98–111)
Creatinine, Ser: 1.66 mg/dL — ABNORMAL HIGH (ref 0.44–1.00)
GFR calc Af Amer: 36 mL/min — ABNORMAL LOW (ref >60)
GFR calc non Af Amer: 31 mL/min — ABNORMAL LOW (ref >60)
Glucose, Bld: 216 mg/dL — ABNORMAL HIGH (ref 70–99)
Potassium: 4.6 mmol/L (ref 3.5–5.1)
Sodium: 136 mmol/L (ref 135–145)
Total Bilirubin: 0.4 mg/dL (ref 0.3–1.2)
Total Protein: 5.4 g/dL — ABNORMAL LOW (ref 6.5–8.1)

## 2019-09-18 LAB — MAGNESIUM: Magnesium: 1.5 mg/dL — ABNORMAL LOW (ref 1.7–2.4)

## 2019-09-18 LAB — GLUCOSE, CAPILLARY
Glucose-Capillary: 189 mg/dL — ABNORMAL HIGH (ref 70–99)
Glucose-Capillary: 189 mg/dL — ABNORMAL HIGH (ref 70–99)
Glucose-Capillary: 200 mg/dL — ABNORMAL HIGH (ref 70–99)
Glucose-Capillary: 243 mg/dL — ABNORMAL HIGH (ref 70–99)
Glucose-Capillary: 268 mg/dL — ABNORMAL HIGH (ref 70–99)

## 2019-09-18 MED ORDER — ROTIGOTINE 4 MG/24HR TD PT24
1.0000 | MEDICATED_PATCH | Freq: Every day | TRANSDERMAL | Status: DC
  Administered 2019-09-18 – 2019-09-20 (×3): 1 via TRANSDERMAL

## 2019-09-18 MED ORDER — MAGNESIUM SULFATE 4 GM/100ML IV SOLN
4.0000 g | Freq: Once | INTRAVENOUS | Status: AC
  Administered 2019-09-18: 4 g via INTRAVENOUS
  Filled 2019-09-18: qty 100

## 2019-09-18 MED ORDER — SODIUM CHLORIDE 0.9% IV SOLUTION: Freq: Once | INTRAVENOUS | Status: AC

## 2019-09-18 MED ORDER — INSULIN GLARGINE 100 UNIT/ML ~~LOC~~ SOLN
15.0000 [IU] | Freq: Every day | SUBCUTANEOUS | Status: DC
  Administered 2019-09-18 – 2019-09-21 (×4): 15 [IU] via SUBCUTANEOUS
  Filled 2019-09-18 (×4): qty 0.15

## 2019-09-18 NOTE — Progress Notes (Signed)
CRITICAL VALUE ALERT  Critical Value:  Hgb 5.7  Date & Time Notied:  09/18/2019 0920  Provider Notified: Reesa Chew, MD  Orders Received/Actions taken: MD on way to bedside and ordering PRBC transfusion.

## 2019-09-18 NOTE — Progress Notes (Signed)
[facility] Progress Note  Subjective: Patient was seen and she states she feels pretty good today.  She has not reported any vomiting blood today or melena today.  It is noted however that hemoglobin dropped and she is going to receive packed red cells today.  Endoscopic report reviewed  Objective: Vital signs in last 24 hours: Temp:  [97.9 F (36.6 C)-99.2 F (37.3 C)] 97.9 F (36.6 C) (08/07 0817) Pulse Rate:  [77-89] 84 (08/07 0600) Resp:  [14-22] 17 (08/07 0600) BP: (107-132)/(44-76) 107/48 (08/07 0600) SpO2:  [91 %-99 %] 91 % (08/07 0600) Weight:  [89.6 kg] 89.6 kg (08/07 0435) Weight change: -0.051 kg   PE:  No distress  Heart regular rhythm  Abdomen soft nontender  Lab Results: Results for orders placed or performed during the hospital encounter of 09/17/19 (from the past 24 hour(s))  Glucose, capillary     Status: Abnormal   Collection Time: 09/17/19  5:14 PM  Result Value Ref Range   Glucose-Capillary 310 (H) 70 - 99 mg/dL   Comment 1 Notify RN    Comment 2 Document in Chart   Glucose, capillary     Status: Abnormal   Collection Time: 09/17/19  7:41 PM  Result Value Ref Range   Glucose-Capillary 318 (H) 70 - 99 mg/dL  Glucose, capillary     Status: Abnormal   Collection Time: 09/17/19 11:49 PM  Result Value Ref Range   Glucose-Capillary 256 (H) 70 - 99 mg/dL  Glucose, capillary     Status: Abnormal   Collection Time: 09/18/19  8:04 AM  Result Value Ref Range   Glucose-Capillary 189 (H) 70 - 99 mg/dL  Comprehensive metabolic panel     Status: Abnormal   Collection Time: 09/18/19  9:03 AM  Result Value Ref Range   Sodium 136 135 - 145 mmol/L   Potassium 4.6 3.5 - 5.1 mmol/L   Chloride 107 98 - 111 mmol/L   CO2 17 (L) 22 - 32 mmol/L   Glucose, Bld 216 (H) 70 - 99 mg/dL   BUN 70 (H) 8 - 23 mg/dL   Creatinine, Ser 1.66 (H) 0.44 - 1.00 mg/dL   Calcium 6.9 (L) 8.9 - 10.3 mg/dL   Total Protein 5.4 (L) 6.5 - 8.1 g/dL   Albumin 3.1 (L) 3.5 - 5.0  g/dL   AST 34 15 - 41 U/L   ALT 40 0 - 44 U/L   Alkaline Phosphatase 140 (H) 38 - 126 U/L   Total Bilirubin 0.4 0.3 - 1.2 mg/dL   GFR calc non Af Amer 31 (L) >60 mL/min   GFR calc Af Amer 36 (L) >60 mL/min   Anion gap 12 5 - 15  Magnesium     Status: Abnormal   Collection Time: 09/18/19  9:03 AM  Result Value Ref Range   Magnesium 1.5 (L) 1.7 - 2.4 mg/dL  CBC     Status: Abnormal   Collection Time: 09/18/19  9:03 AM  Result Value Ref Range   WBC 31.2 (H) 4.0 - 10.5 K/uL   RBC 1.84 (L) 3.87 - 5.11 MIL/uL   Hemoglobin 5.7 (LL) 12.0 - 15.0 g/dL   HCT 18.8 (L) 36 - 46 %   MCV 102.2 (H) 80.0 - 100.0 fL   MCH 31.0 26.0 - 34.0 pg   MCHC 30.3 30.0 - 36.0 g/dL   RDW 19.6 (H) 11.5 - 15.5 %   Platelets 122 (L) 150 - 400 K/uL   nRBC 0.4 (H) 0.0 -  0.2 %  Glucose, capillary     Status: Abnormal   Collection Time: 09/18/19 12:03 PM  Result Value Ref Range   Glucose-Capillary 189 (H) 70 - 99 mg/dL   *Note: Due to a large number of results and/or encounters for the requested time period, some results have not been displayed. A complete set of results can be found in Results Review.    Studies/Results: No results found.    Assessment: GI bleed    Plan:   Transfuse blood  Continue PPI and octreotide  Follow clinically    Cassell Clement 09/18/2019, 12:19 PM  Pager: [phone] If no answer or after 5 PM call [phone]

## 2019-09-18 NOTE — Progress Notes (Signed)
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  GI consulted, started on octreotide infusion.  Underwent EGD which showed minimal bleeding, remain on octreotide drip, PPI drip.  Hemoglobin trended down therefore received PRBC transfusion.   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 -On empiric Rocephin -Endoscopy 09/17/2019 showed minimal bleeding.  GI team is following.  Acute blood loss anemia -Baseline hemoglobin 10, received 1 unit PRBC transfusion which improved her hemoglobin but this morning dropped again down to 5.7.  No obvious evidence of blood loss.  Suspect either persistent slow bleed versus equilibration -2 units PRBC ordered -Currently n.p.o. until GI evaluation.  If no further endoscopic evaluation planned today again then she can likely a clear liquid diet.  Diabetes mellitus type 2, uncontrolled secondary to hyperglycemia -Insulin sliding scale and Accu-Cheks -Increase Lantus to 15 units daily.  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   DVT prophylaxis: SCDs Code Status: Full code Family Communication:  Husband  Status is: Inpatient  Remains inpatient appropriate  because:Hemodynamically unstable   Dispo: The patient is from: Home              Anticipated d/c is to: Home              Anticipated d/c date is: > 3 days              Patient currently is not medically stable to d/c.  Patient is having active bleeding, ongoing evaluation.   Body mass index is 36.13 kg/m.      Subjective: Patient seen and examined this morning, she tells me she feels very lousy but denies any obvious signs of bleeding.  No other acute events overnight.  Review of systems: General: Denies fever, chills, night sweats or unintended weight loss. Resp: Denies cough, wheezing, shortness of breath. Cardiac: Denies chest pain, palpitations, orthopnea, paroxysmal nocturnal dyspnea. GI: Denies abdominal pain, nausea, vomiting, diarrhea or constipation GU: Denies dysuria, frequency, hesitancy or incontinence MS: Denies muscle aches, joint pain or swelling Neuro: Denies headache, neurologic deficits (focal weakness, numbness, tingling), abnormal gait Psych: Denies anxiety, depression, SI/HI/AVH Skin: Denies new rashes or lesions ID: Denies sick contacts, exotic exposures, travel  Physical exam:  Constitutional: Appears chronically weak and now.,  Slightly pale appearing Respiratory: Clear to auscultation bilaterally Cardiovascular: Normal sinus rhythm, no rubs Abdomen: Nontender nondistended good bowel sounds Musculoskeletal: No edema noted Skin: No rashes seen Neurologic: CN 2-12 grossly intact.  And nonfocal Psychiatric: Normal judgment and insight. Alert and oriented x 3. Normal mood.  Right chest wall Chemo-Port in place    Objective: Vitals:   09/18/19 0406 09/18/19 0435 09/18/19 0600 09/18/19 0817  BP: (!) 108/47  (!) 107/48   Pulse: 85  84   Resp: 17  17   Temp:    97.9 F (36.6 C)  TempSrc:  Oral  SpO2: 98%  91%   Weight:  89.6 kg    Height:        Intake/Output Summary (Last 24 hours) at 09/18/2019 1030 Last data filed at 09/18/2019 0100 Gross  per 24 hour  Intake 1144.27 ml  Output --  Net 1144.27 ml   Filed Weights   09/17/19 0155 09/17/19 0914 09/18/19 0435  Weight: 88.5 kg 88.4 kg 89.6 kg     Data Reviewed:   CBC: Recent Labs  Lab 09/17/19 0224 09/18/19 0903  WBC 28.9* 31.2*  NEUTROABS 23.4*  --   HGB 7.2* 5.7*  HCT 23.0* 18.8*  MCV 99.6 102.2*  PLT 135* 510*   Basic Metabolic Panel: Recent Labs  Lab 09/17/19 0224 09/18/19 0903  NA 136 136  K 4.8 4.6  CL 106 107  CO2 21* 17*  GLUCOSE 314* 216*  BUN 51* 70*  CREATININE 0.99 1.66*  CALCIUM 8.0* 6.9*  MG  --  1.5*   GFR: Estimated Creatinine Clearance: 32.3 mL/min (A) (by C-G formula based on SCr of 1.66 mg/dL (H)). Liver Function Tests: Recent Labs  Lab 09/17/19 0224 09/18/19 0903  AST 22 34  ALT 24 40  ALKPHOS 139* 140*  BILITOT 0.9 0.4  PROT 5.4* 5.4*  ALBUMIN 3.3* 3.1*   No results for input(s): LIPASE, AMYLASE in the last 168 hours. No results for input(s): AMMONIA in the last 168 hours. Coagulation Profile: Recent Labs  Lab 09/17/19 0224  INR 1.4*   Cardiac Enzymes: No results for input(s): CKTOTAL, CKMB, CKMBINDEX, TROPONINI in the last 168 hours. BNP (last 3 results) No results for input(s): PROBNP in the last 8760 hours. HbA1C: No results for input(s): HGBA1C in the last 72 hours. CBG: Recent Labs  Lab 09/17/19 1205 09/17/19 1714 09/17/19 1941 09/17/19 2349 09/18/19 0804  GLUCAP 215* 310* 318* 256* 189*   Lipid Profile: No results for input(s): CHOL, HDL, LDLCALC, TRIG, CHOLHDL, LDLDIRECT in the last 72 hours. Thyroid Function Tests: No results for input(s): TSH, T4TOTAL, FREET4, T3FREE, THYROIDAB in the last 72 hours. Anemia Panel: No results for input(s): VITAMINB12, FOLATE, FERRITIN, TIBC, IRON, RETICCTPCT in the last 72 hours. Sepsis Labs: No results for input(s): PROCALCITON, LATICACIDVEN in the last 168 hours.  Recent Results (from the past 240 hour(s))  SARS Coronavirus 2 by RT PCR (hospital order,  performed in [facility] hospital lab) Nasopharyngeal Nasopharyngeal Swab     Status: None   Collection Time: 09/17/19  3:24 AM   Specimen: Nasopharyngeal Swab  Result Value Ref Range Status   SARS Coronavirus 2 NEGATIVE NEGATIVE Final    Comment: (NOTE) SARS-CoV-2 target nucleic acids are NOT DETECTED.  The SARS-CoV-2 RNA is generally detectable in upper and lower respiratory specimens during the acute phase of infection. The lowest concentration of SARS-CoV-2 viral copies this assay can detect is 250 copies / mL. A negative result does not preclude SARS-CoV-2 infection and should not be used as the sole basis for treatment or other patient management decisions.  A negative result may occur with improper specimen collection / handling, submission of specimen other than nasopharyngeal swab, presence of viral mutation(s) within the areas targeted by this assay, and inadequate number of viral copies (<250 copies / mL). A negative result must be combined with clinical observations, patient history, and epidemiological information.  Fact Sheet for Patients:   StrictlyIdeas.no  Fact Sheet for Healthcare Providers: BankingDealers.co.za  This test is not yet approved or  cleared by the [place] FDA  and has been authorized for detection and/or diagnosis of SARS-CoV-2 by FDA under an Emergency Use Authorization (EUA).  This EUA will remain in effect (meaning this test can be used) for the duration of the COVID-19 declaration under Section 564(b)(1) of the Act, 21 U.S.C. section 360bbb-3(b)(1), unless the authorization is terminated or revoked sooner.  Performed at [facility], [place] [address], [place],  [address]          Radiology Studies: No results found.      Scheduled Meds: . sodium chloride   Intravenous Once  . sodium chloride   Intravenous Once  . chlorhexidine  15 mL Mouth Rinse BID  .  chlorhexidine      . Chlorhexidine Gluconate Cloth  6 each Topical Daily  . cholecalciferol  1,000 Units Oral QHS  . gabapentin  900 mg Oral BID  . insulin aspart  0-9 Units Subcutaneous Q4H  . insulin glargine  15 Units Subcutaneous Daily  . mouth rinse  15 mL Mouth Rinse q12n4p  . nadolol  20 mg Oral QHS  . [START ON 09/20/2019] pantoprazole  40 mg Intravenous Q12H   Continuous Infusions: . cefTRIAXone (ROCEPHIN)  IV Stopped (09/18/19 0458)  . magnesium sulfate bolus IVPB    . octreotide  (SANDOSTATIN)    IV infusion 50 mcg/hr (09/18/19 0100)  . pantoprozole (PROTONIX) infusion 8 mg/hr (09/18/19 0100)     LOS: 1 day   Time spent= 35 mins    Willson Lipa Arsenio Loader, MD [facility]  If 7PM-7AM, please contact night-coverage  09/18/2019, 10:30 AM

## 2019-09-19 LAB — CBC
HCT: 25 % — ABNORMAL LOW (ref 36.0–46.0)
Hemoglobin: 8.1 g/dL — ABNORMAL LOW (ref 12.0–15.0)
MCH: 31.3 pg (ref 26.0–34.0)
MCHC: 32.4 g/dL (ref 30.0–36.0)
MCV: 96.5 fL (ref 80.0–100.0)
Platelets: 86 10*3/uL — ABNORMAL LOW (ref 150–400)
RBC: 2.59 MIL/uL — ABNORMAL LOW (ref 3.87–5.11)
RDW: 18.3 % — ABNORMAL HIGH (ref 11.5–15.5)
WBC: 20.3 10*3/uL — ABNORMAL HIGH (ref 4.0–10.5)
nRBC: 0.3 % — ABNORMAL HIGH (ref 0.0–0.2)

## 2019-09-19 LAB — COMPREHENSIVE METABOLIC PANEL
ALT: 39 U/L (ref 0–44)
AST: 29 U/L (ref 15–41)
Albumin: 3.2 g/dL — ABNORMAL LOW (ref 3.5–5.0)
Alkaline Phosphatase: 119 U/L (ref 38–126)
Anion gap: 10 (ref 5–15)
BUN: 54 mg/dL — ABNORMAL HIGH (ref 8–23)
CO2: 18 mmol/L — ABNORMAL LOW (ref 22–32)
Calcium: 7 mg/dL — ABNORMAL LOW (ref 8.9–10.3)
Chloride: 106 mmol/L (ref 98–111)
Creatinine, Ser: 1.37 mg/dL — ABNORMAL HIGH (ref 0.44–1.00)
GFR calc Af Amer: 45 mL/min — ABNORMAL LOW (ref >60)
GFR calc non Af Amer: 39 mL/min — ABNORMAL LOW (ref >60)
Glucose, Bld: 220 mg/dL — ABNORMAL HIGH (ref 70–99)
Potassium: 4.2 mmol/L (ref 3.5–5.1)
Sodium: 134 mmol/L — ABNORMAL LOW (ref 135–145)
Total Bilirubin: 0.8 mg/dL (ref 0.3–1.2)
Total Protein: 5.6 g/dL — ABNORMAL LOW (ref 6.5–8.1)

## 2019-09-19 LAB — GLUCOSE, CAPILLARY
Glucose-Capillary: 174 mg/dL — ABNORMAL HIGH (ref 70–99)
Glucose-Capillary: 164 mg/dL — ABNORMAL HIGH (ref 70–99)
Glucose-Capillary: 166 mg/dL — ABNORMAL HIGH (ref 70–99)
Glucose-Capillary: 168 mg/dL — ABNORMAL HIGH (ref 70–99)
Glucose-Capillary: 213 mg/dL — ABNORMAL HIGH (ref 70–99)
Glucose-Capillary: 155 mg/dL — ABNORMAL HIGH (ref 70–99)

## 2019-09-19 LAB — MAGNESIUM: Magnesium: 2.6 mg/dL — ABNORMAL HIGH (ref 1.7–2.4)

## 2019-09-19 MED ORDER — GERHARDT'S BUTT CREAM
TOPICAL_CREAM | Freq: Four times a day (QID) | CUTANEOUS | Status: DC | PRN
  Filled 2019-09-19: qty 1

## 2019-09-19 MED ORDER — SODIUM CHLORIDE 0.9% FLUSH: 10.0000 mL | INTRAVENOUS | Status: DC | PRN

## 2019-09-19 MED ORDER — MENTHOL 3 MG MT LOZG
1.0000 | LOZENGE | OROMUCOSAL | Status: DC | PRN
  Filled 2019-09-19: qty 9

## 2019-09-19 NOTE — Progress Notes (Signed)
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  GI consulted, started on octreotide infusion.  Underwent EGD which showed minimal bleeding, remain on octreotide drip, PPI drip.  Hemoglobin trended down therefore received PRBC transfusion.   Assessment & Plan:   Principal Problem:   Acute GI bleeding Active Problems:   Fatty liver disease, nonalcoholic   Essential hypertension   Disseminated ovarian cancer ([facility])   Diabetes mellitus with diabetic neuropathy, with long-term current use of insulin ([facility])   Chemotherapy-induced thrombocytopenia   Diabetes mellitus without complication ([facility])   Malignant neoplasm of peritoneum ([facility])  Acute upper GI bleed History of variceal bleeding Nash cirrhosis -On Protonix and octreotide drip -On empiric Rocephin -Endoscopy 09/17/2019 showed minimal bleeding.  GI team is following.  Acute blood loss anemia -Baseline hemoglobin around 10.0.  Status post 2 units PRBC transfusion 8/7.  Hemoglobin 8.1. -GI following.  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   DVT prophylaxis: SCDs Code Status: Full code Family Communication: Husband at bedside during my evaluation  Status is: Inpatient  Remains inpatient appropriate because:Hemodynamically unstable   Dispo: The patient is from: Home              Anticipated d/c is to: Home              Anticipated d/c date is: > 3  days              Patient currently is not medically stable to d/c.  Patient is having active bleeding, ongoing evaluation.   Body mass index is 35.97 kg/m.      Subjective: Patient sitting on the chair at this time any complaints this morning feels better.  Slightly dark stool but improved.  Review of systems: General: Denies fever, chills, night sweats or unintended weight loss. Resp: Denies cough, wheezing, shortness of breath. Cardiac: Denies chest pain, palpitations, orthopnea, paroxysmal nocturnal dyspnea. GI: Denies abdominal pain, nausea, vomiting, diarrhea or constipation GU: Denies dysuria, frequency, hesitancy or incontinence MS: Denies muscle aches, joint pain or swelling Neuro: Denies headache, neurologic deficits (focal weakness, numbness, tingling), abnormal gait Psych: Denies anxiety, depression, SI/HI/AVH Skin: Denies new rashes or lesions ID: Denies sick contacts, exotic exposures, travel  Physical exam: Constitutional: Not in acute distress Respiratory: Clear to auscultation bilaterally Cardiovascular: Normal sinus rhythm, no rubs Abdomen: Nontender nondistended good bowel sounds Musculoskeletal: No edema noted Skin: No rashes seen Neurologic: CN 2-12 grossly intact.  And nonfocal Psychiatric: Normal judgment and insight. Alert and oriented x 3. Normal mood.  Right chest wall Chemo-Port in place    Objective: Vitals:   09/19/19 0200 09/19/19 0424 09/19/19 0451 09/19/19 0515  BP: (!) 101/43 (!) 103/37    Pulse: 63 62    Resp: 12 12    Temp:    97.7 F (36.5 C)  TempSrc:    Oral  SpO2: 95% 96%    Weight:   89.2 kg   Height:        Intake/Output Summary (Last 24 hours) at 09/19/2019 3833 Last data  filed at 09/19/2019 0520 Gross per 24 hour  Intake 1754.11 ml  Output --  Net 1754.11 ml   Filed Weights   09/17/19 0914 09/18/19 0435 09/19/19 0451  Weight: 88.4 kg 89.6 kg 89.2 kg     Data Reviewed:   CBC: Recent Labs  Lab 09/17/19 0224  09/18/19 0903 09/19/19 0058  WBC 28.9* 31.2* 20.3*  NEUTROABS 23.4*  --   --   HGB 7.2* 5.7* 8.1*  HCT 23.0* 18.8* 25.0*  MCV 99.6 102.2* 96.5  PLT 135* 122* 86*   Basic Metabolic Panel: Recent Labs  Lab 09/17/19 0224 09/18/19 0903 09/19/19 0058  NA 136 136 134*  K 4.8 4.6 4.2  CL 106 107 106  CO2 21* 17* 18*  GLUCOSE 314* 216* 220*  BUN 51* 70* 54*  CREATININE 0.99 1.66* 1.37*  CALCIUM 8.0* 6.9* 7.0*  MG  --  1.5* 2.6*   GFR: Estimated Creatinine Clearance: 39.1 mL/min (A) (by C-G formula based on SCr of 1.37 mg/dL (H)). Liver Function Tests: Recent Labs  Lab 09/17/19 0224 09/18/19 0903 09/19/19 0058  AST 22 34 29  ALT 24 40 39  ALKPHOS 139* 140* 119  BILITOT 0.9 0.4 0.8  PROT 5.4* 5.4* 5.6*  ALBUMIN 3.3* 3.1* 3.2*   No results for input(s): LIPASE, AMYLASE in the last 168 hours. No results for input(s): AMMONIA in the last 168 hours. Coagulation Profile: Recent Labs  Lab 09/17/19 0224  INR 1.4*   Cardiac Enzymes: No results for input(s): CKTOTAL, CKMB, CKMBINDEX, TROPONINI in the last 168 hours. BNP (last 3 results) No results for input(s): PROBNP in the last 8760 hours. HbA1C: No results for input(s): HGBA1C in the last 72 hours. CBG: Recent Labs  Lab 09/18/19 1203 09/18/19 1630 09/18/19 2009 09/18/19 2348 09/19/19 0442  GLUCAP 189* 268* 243* 200* 174*   Lipid Profile: No results for input(s): CHOL, HDL, LDLCALC, TRIG, CHOLHDL, LDLDIRECT in the last 72 hours. Thyroid Function Tests: No results for input(s): TSH, T4TOTAL, FREET4, T3FREE, THYROIDAB in the last 72 hours. Anemia Panel: No results for input(s): VITAMINB12, FOLATE, FERRITIN, TIBC, IRON, RETICCTPCT in the last 72 hours. Sepsis Labs: No results for input(s): PROCALCITON, LATICACIDVEN in the last 168 hours.  Recent Results (from the past 240 hour(s))  SARS Coronavirus 2 by RT PCR (hospital order, performed in [facility] hospital lab) Nasopharyngeal Nasopharyngeal Swab     Status:  None   Collection Time: 09/17/19  3:24 AM   Specimen: Nasopharyngeal Swab  Result Value Ref Range Status   SARS Coronavirus 2 NEGATIVE NEGATIVE Final    Comment: (NOTE) SARS-CoV-2 target nucleic acids are NOT DETECTED.  The SARS-CoV-2 RNA is generally detectable in upper and lower respiratory specimens during the acute phase of infection. The lowest concentration of SARS-CoV-2 viral copies this assay can detect is 250 copies / mL. A negative result does not preclude SARS-CoV-2 infection and should not be used as the sole basis for treatment or other patient management decisions.  A negative result may occur with improper specimen collection / handling, submission of specimen other than nasopharyngeal swab, presence of viral mutation(s) within the areas targeted by this assay, and inadequate number of viral copies (<250 copies / mL). A negative result must be combined with clinical observations, patient history, and epidemiological information.  Fact Sheet for Patients:   StrictlyIdeas.no  Fact Sheet for Healthcare Providers: BankingDealers.co.za  This test is not yet approved or  cleared by the [place] FDA and has been authorized  for detection and/or diagnosis of SARS-CoV-2 by FDA under an Emergency Use Authorization (EUA).  This EUA will remain in effect (meaning this test can be used) for the duration of the COVID-19 declaration under Section 564(b)(1) of the Act, 21 U.S.C. section 360bbb-3(b)(1), unless the authorization is terminated or revoked sooner.  Performed at [facility], [place] [address], [place], [place] [address]   MRSA PCR Screening     Status: None   Collection Time: 09/18/19  2:32 PM   Specimen: Nasal Mucosa; Nasopharyngeal  Result Value Ref Range Status   MRSA by PCR NEGATIVE NEGATIVE Final    Comment:        The GeneXpert MRSA Assay (FDA approved for NASAL specimens only), is one  component of a comprehensive MRSA colonization surveillance program. It is not intended to diagnose MRSA infection nor to guide or monitor treatment for MRSA infections. Performed at [facility], [place] [address] [place], [place] [address]          Radiology Studies: No results found.      Scheduled Meds: . chlorhexidine  15 mL Mouth Rinse BID  . Chlorhexidine Gluconate Cloth  6 each Topical Daily  . cholecalciferol  1,000 Units Oral QHS  . gabapentin  900 mg Oral BID  . insulin aspart  0-9 Units Subcutaneous Q4H  . insulin glargine  15 Units Subcutaneous Daily  . mouth rinse  15 mL Mouth Rinse q12n4p  . nadolol  20 mg Oral QHS  . [START ON 09/20/2019] pantoprazole  40 mg Intravenous Q12H  . rotigotine  1 patch Transdermal Daily   Continuous Infusions: . cefTRIAXone (ROCEPHIN)  IV Stopped (09/19/19 0520)  . octreotide  (SANDOSTATIN)    IV infusion 50 mcg/hr (09/19/19 0520)  . pantoprozole (PROTONIX) infusion 8 mg/hr (09/19/19 0520)     LOS: 2 days   Time spent= 35 mins    Vihana Kydd Arsenio Loader, MD [facility]  If 7PM-7AM, please contact night-coverage  09/19/2019, 7:24 AM

## 2019-09-19 NOTE — Progress Notes (Signed)
Patient states that she feels good and does not report any bleeding overnight or today.  She is in no distress  Would recommend continuing PPI therapy and also keep on octreotide another 24 hours.  If no further bleeding can discontinue octreotide and convert to oral PPI therapy.  Advance diet to full liquids.

## 2019-09-20 DIAGNOSIS — K922 Gastrointestinal hemorrhage, unspecified: Secondary | ICD-10-CM

## 2019-09-20 LAB — HEMOGLOBIN AND HEMATOCRIT, BLOOD
HCT: 26.1 % — ABNORMAL LOW (ref 36.0–46.0)
Hemoglobin: 8.5 g/dL — ABNORMAL LOW (ref 12.0–15.0)

## 2019-09-20 LAB — GLUCOSE, CAPILLARY
Glucose-Capillary: 122 mg/dL — ABNORMAL HIGH (ref 70–99)
Glucose-Capillary: 124 mg/dL — ABNORMAL HIGH (ref 70–99)
Glucose-Capillary: 135 mg/dL — ABNORMAL HIGH (ref 70–99)
Glucose-Capillary: 172 mg/dL — ABNORMAL HIGH (ref 70–99)
Glucose-Capillary: 193 mg/dL — ABNORMAL HIGH (ref 70–99)
Glucose-Capillary: 193 mg/dL — ABNORMAL HIGH (ref 70–99)

## 2019-09-20 LAB — CBC
HCT: 21.6 % — ABNORMAL LOW (ref 36.0–46.0)
Hemoglobin: 6.8 g/dL — CL (ref 12.0–15.0)
MCH: 31.6 pg (ref 26.0–34.0)
MCHC: 31.5 g/dL (ref 30.0–36.0)
MCV: 100.5 fL — ABNORMAL HIGH (ref 80.0–100.0)
Platelets: 54 10*3/uL — ABNORMAL LOW (ref 150–400)
RBC: 2.15 MIL/uL — ABNORMAL LOW (ref 3.87–5.11)
RDW: 19 % — ABNORMAL HIGH (ref 11.5–15.5)
WBC: 7.5 10*3/uL (ref 4.0–10.5)
nRBC: 0.3 % — ABNORMAL HIGH (ref 0.0–0.2)

## 2019-09-20 LAB — COMPREHENSIVE METABOLIC PANEL
ALT: 41 U/L (ref 0–44)
AST: 32 U/L (ref 15–41)
Albumin: 2.9 g/dL — ABNORMAL LOW (ref 3.5–5.0)
Alkaline Phosphatase: 84 U/L (ref 38–126)
Anion gap: 4 — ABNORMAL LOW (ref 5–15)
BUN: 33 mg/dL — ABNORMAL HIGH (ref 8–23)
CO2: 22 mmol/L (ref 22–32)
Calcium: 7.2 mg/dL — ABNORMAL LOW (ref 8.9–10.3)
Chloride: 111 mmol/L (ref 98–111)
Creatinine, Ser: 1.06 mg/dL — ABNORMAL HIGH (ref 0.44–1.00)
GFR calc Af Amer: >60 mL/min (ref >60)
GFR calc non Af Amer: 53 mL/min — ABNORMAL LOW (ref >60)
Glucose, Bld: 131 mg/dL — ABNORMAL HIGH (ref 70–99)
Potassium: 4 mmol/L (ref 3.5–5.1)
Sodium: 137 mmol/L (ref 135–145)
Total Bilirubin: 0.8 mg/dL (ref 0.3–1.2)
Total Protein: 4.9 g/dL — ABNORMAL LOW (ref 6.5–8.1)

## 2019-09-20 LAB — PREPARE RBC (CROSSMATCH)

## 2019-09-20 LAB — MAGNESIUM: Magnesium: 2.3 mg/dL (ref 1.7–2.4)

## 2019-09-20 MED ORDER — SODIUM CHLORIDE 0.9 % IV SOLN
50.0000 ug/h | INTRAVENOUS | Status: AC
  Administered 2019-09-20 – 2019-09-21 (×3): 50 ug/h via INTRAVENOUS
  Filled 2019-09-20 (×3): qty 1

## 2019-09-20 MED ORDER — SODIUM CHLORIDE 0.9 % IV SOLN
8.0000 mg/h | INTRAVENOUS | Status: DC
  Administered 2019-09-20 (×2): 8 mg/h via INTRAVENOUS
  Filled 2019-09-20 (×3): qty 80

## 2019-09-20 MED ORDER — SODIUM CHLORIDE 0.9% IV SOLUTION: Freq: Once | INTRAVENOUS | Status: AC

## 2019-09-20 NOTE — Progress Notes (Signed)
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  GI consulted, started on octreotide infusion.  Underwent EGD which showed minimal bleeding, remain on octreotide drip, PPI drip.  Hemoglobin trended down therefore received PRBC transfusion.   Assessment & Plan:   Principal Problem:   Acute GI bleeding Active Problems:   Fatty liver disease, nonalcoholic   Essential hypertension   Disseminated ovarian cancer (HCC)   Diabetes mellitus with diabetic neuropathy, with long-term current use of insulin (HCC)   Chemotherapy-induced thrombocytopenia   Diabetes mellitus without complication (HCC)   Malignant neoplasm of peritoneum (HCC)  Acute upper GI bleed History of variceal bleeding Nash cirrhosis -Continue Protonix and octreotide drip.  GI team following.  Hemodynamically stable at the moment. -On empiric Rocephin -Endoscopy 09/17/2019 showed minimal bleeding.    Acute blood loss anemia -Baseline hemoglobin around 10.0.  2 units PRBC 8/7.  Hemoglobin dropped again this morning, 2 more units PRBC ordered -GI following.  Diet per GI -She may require CTA abdomen pelvis or bleeding scan to rule out any occult bleeding.  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 Dr Alvy Bimler will see the patient later today but no need for platelet transfusion unless platelets less than 10K   DVT prophylaxis: SCDs Code Status: Full code Family  Communication:   Status is: Inpatient  Remains inpatient appropriate because:Hemodynamically unstable   Dispo: The patient is from: Home              Anticipated d/c is to: Home              Anticipated d/c date is: 2-3 days              Patient currently is not medically stable to d/c.  Recurrent drop in hemoglobin and platelet, ongoing evaluation.  Maintain hospital stay until it stabilizes   Body mass index is 35.93 kg/m.      Subjective: Feels little lousy and tired this morning.  Tells me her dark stools are pretty much clearing up otherwise denies any other evidence of bleeding from elsewhere.  Review of systems: General: Denies fever, chills, night sweats or unintended weight loss. Resp: Denies cough, wheezing, shortness of breath. Cardiac: Denies chest pain, palpitations, orthopnea, paroxysmal nocturnal dyspnea. GI: Denies abdominal pain, nausea, vomiting, diarrhea or constipation GU: Denies dysuria, frequency, hesitancy or incontinence MS: Denies muscle aches, joint pain or swelling Neuro: Denies headache, neurologic deficits (focal weakness, numbness, tingling), abnormal gait Psych: Denies anxiety, depression, SI/HI/AVH Skin: Denies new rashes or lesions ID: Denies sick contacts, exotic exposures, travel  Physical exam: Constitutional: Not in acute distress, appears slightly pale.  Very pleasant. Respiratory: Clear to auscultation bilaterally Cardiovascular: Normal sinus rhythm, no rubs Abdomen: Nontender nondistended good bowel sounds Musculoskeletal: No edema noted Skin: No rashes seen Neurologic: CN 2-12 grossly intact.  And nonfocal Psychiatric: Normal judgment and insight. Alert and oriented x 3. Normal mood.  Right chest wall Chemo-Port in place    Objective: Vitals:   09/20/19 0337 09/20/19 0404 09/20/19 0500 09/20/19 0612  BP:  133/62  (!) 127/58  Pulse:  62  63  Resp:  14  19  Temp: 98.3 F (36.8 C)     TempSrc: Oral     SpO2:  97%  98%    Weight:   89.1 kg   Height:        Intake/Output Summary (Last 24 hours) at 09/20/2019 0729 Last data filed at 09/20/2019 0616 Gross per 24 hour  Intake 2790.45 ml  Output --  Net 2790.45 ml   Filed Weights   09/18/19 0435 09/19/19 0451 09/20/19 0500  Weight: 89.6 kg 89.2 kg 89.1 kg     Data Reviewed:   CBC: Recent Labs  Lab 09/17/19 0224 09/18/19 0903 09/19/19 0058 09/20/19 0500  WBC 28.9* 31.2* 20.3* 7.5  NEUTROABS 23.4*  --   --   --   HGB 7.2* 5.7* 8.1* 6.8*  HCT 23.0* 18.8* 25.0* 21.6*  MCV 99.6 102.2* 96.5 100.5*  PLT 135* 122* 86* 54*   Basic Metabolic Panel: Recent Labs  Lab 09/17/19 0224 09/18/19 0903 09/19/19 0058 09/20/19 0500  NA 136 136 134* 137  K 4.8 4.6 4.2 4.0  CL 106 107 106 111  CO2 21* 17* 18* 22  GLUCOSE 314* 216* 220* 131*  BUN 51* 70* 54* 33*  CREATININE 0.99 1.66* 1.37* 1.06*  CALCIUM 8.0* 6.9* 7.0* 7.2*  MG  --  1.5* 2.6* 2.3   GFR: Estimated Creatinine Clearance: 50.5 mL/min (A) (by C-G formula based on SCr of 1.06 mg/dL (H)). Liver Function Tests: Recent Labs  Lab 09/17/19 0224 09/18/19 0903 09/19/19 0058 09/20/19 0500  AST 22 34 29 32  ALT 24 40 39 41  ALKPHOS 139* 140* 119 84  BILITOT 0.9 0.4 0.8 0.8  PROT 5.4* 5.4* 5.6* 4.9*  ALBUMIN 3.3* 3.1* 3.2* 2.9*   No results for input(s): LIPASE, AMYLASE in the last 168 hours. No results for input(s): AMMONIA in the last 168 hours. Coagulation Profile: Recent Labs  Lab 09/17/19 0224  INR 1.4*   Cardiac Enzymes: No results for input(s): CKTOTAL, CKMB, CKMBINDEX, TROPONINI in the last 168 hours. BNP (last 3 results) No results for input(s): PROBNP in the last 8760 hours. HbA1C: No results for input(s): HGBA1C in the last 72 hours. CBG: Recent Labs  Lab 09/19/19 1248 09/19/19 1704 09/19/19 1959 09/19/19 2319 09/20/19 0329  GLUCAP 166* 168* 213* 155* 135*   Lipid Profile: No results for input(s): CHOL, HDL, LDLCALC, TRIG, CHOLHDL, LDLDIRECT in the last 72  hours. Thyroid Function Tests: No results for input(s): TSH, T4TOTAL, FREET4, T3FREE, THYROIDAB in the last 72 hours. Anemia Panel: No results for input(s): VITAMINB12, FOLATE, FERRITIN, TIBC, IRON, RETICCTPCT in the last 72 hours. Sepsis Labs: No results for input(s): PROCALCITON, LATICACIDVEN in the last 168 hours.  Recent Results (from the past 240 hour(s))  SARS Coronavirus 2 by RT PCR (hospital order, performed in [facility] hospital lab) Nasopharyngeal Nasopharyngeal Swab     Status: None   Collection Time: 09/17/19  3:24 AM   Specimen: Nasopharyngeal Swab  Result Value Ref Range Status   SARS Coronavirus 2 NEGATIVE NEGATIVE Final    Comment: (NOTE) SARS-CoV-2 target nucleic acids are NOT DETECTED.  The SARS-CoV-2 RNA is generally detectable in upper and lower respiratory specimens during the acute phase of infection. The lowest concentration of SARS-CoV-2 viral copies this assay can detect is 250 copies / mL. A negative result does not preclude SARS-CoV-2 infection and should not be used as the sole basis for treatment or other patient management decisions.  A negative result may occur with improper specimen collection / handling, submission of specimen other than nasopharyngeal swab, presence of viral mutation(s) within the areas targeted by this assay, and inadequate number of viral copies (<250 copies / mL). A negative result must be combined with clinical observations, patient history, and epidemiological information.  Fact Sheet for Patients:   StrictlyIdeas.no  Fact Sheet for Healthcare Providers: BankingDealers.co.za  This test is not yet approved or  cleared by the [place] FDA and has been authorized for detection and/or diagnosis of SARS-CoV-2 by FDA under an Emergency Use Authorization (EUA).  This EUA will remain in effect (meaning this test can be used) for the duration of the COVID-19 declaration under  Section 564(b)(1) of the Act, 21 U.S.C. section 360bbb-3(b)(1), unless the authorization is terminated or revoked sooner.  Performed at [facility], [place] [address] [place], [place] [address]   MRSA PCR Screening     Status: None   Collection Time: 09/18/19  2:32 PM   Specimen: Nasal Mucosa; Nasopharyngeal  Result Value Ref Range Status   MRSA by PCR NEGATIVE NEGATIVE Final    Comment:        The GeneXpert MRSA Assay (FDA approved for NASAL specimens only), is one component of a comprehensive MRSA colonization surveillance program. It is not intended to diagnose MRSA infection nor to guide or monitor treatment for MRSA infections. Performed at [facility], [place] [address] [place], [place] [address]          Radiology Studies: No results found.      Scheduled Meds: . sodium chloride   Intravenous Once  . chlorhexidine  15 mL Mouth Rinse BID  . Chlorhexidine Gluconate Cloth  6 each Topical Daily  . cholecalciferol  1,000 Units Oral QHS  . gabapentin  900 mg Oral BID  . insulin aspart  0-9 Units Subcutaneous Q4H  . insulin glargine  15 Units Subcutaneous Daily  . mouth rinse  15 mL Mouth Rinse q12n4p  . nadolol  20 mg Oral QHS  . pantoprazole  40 mg Intravenous Q12H  . rotigotine  1 patch Transdermal Daily   Continuous Infusions: . cefTRIAXone (ROCEPHIN)  IV Stopped (09/20/19 0433)  . octreotide  (SANDOSTATIN)    IV infusion 50 mcg/hr (09/20/19 0616)     LOS: 3 days   Time spent= 35 mins    Thania Woodlief Arsenio Loader, MD [facility]  If 7PM-7AM, please contact night-coverage  09/20/2019, 7:29 AM

## 2019-09-20 NOTE — Progress Notes (Signed)
CRITICAL VALUE ALERT  Critical Value: Hemoglobin= 6.8  Date & Time Notied:  09/20/19; 6:08am  Provider Notified: Paged [email or web address]  Orders Received/Actions taken: Waiting for orders

## 2019-09-20 NOTE — Progress Notes (Signed)
[name] 11:29 AM  Subjective: Patient seen and examined and case discussed with my partner Dr. 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

## 2019-09-20 NOTE — Progress Notes (Signed)
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 She does not need platelet transfusion unless platelet count is less than 10  Mild coagulopathy She does not need fresh frozen plasma unless INR greater than 1.6  Ovarian cancer She is scheduled for chemotherapy next week We will keep her appointment as scheduled  Poorly controlled diabetes I will defer to primary service She has been getting insulin treatment at home  Code Status Full code  Goals of care Her blood counts need to be stable for at least 48 hours before discharge Continue aggressive supportive care  Discharge planning Hopefully the next 2 to 3 days Please call if questions arise  All questions were answered. The patient knows to call the clinic with any problems, questions or concerns.   [name], MD 09/20/2019 11:15 AM  Subjective:  The patient is well-known to me.  I was told she is admitted to the hospital due to GI bleed.  She had repeat upper endoscopy evaluation last week which show large amount of blood with clots in the stomach, no source of bleeding found.  Grade 1 varices is noted, to use small to band.  Antral erosions are seen.  Since admission to the hospital, she require octreotide infusion along with blood transfusion Her husband is by the bedside She denies chest pain, dizziness or shortness of breath  Objective:  Vitals:   09/20/19 1022 09/20/19 1107  BP: (!) 134/59 (!) 144/53  Pulse: 63 64  Resp: 17 (!) 21  Temp: 97.6 F (36.4 C) 97.9 F (36.6 C)  SpO2: 100%      Intake/Output Summary (Last 24 hours) at 09/20/2019 1115 Last data filed at 09/20/2019 1022 Gross per 24 hour  Intake 2443.44 ml   Output --  Net 2443.44 ml    GENERAL:alert, no distress and comfortable SKIN: she looks pale NEURO: alert & oriented x 3 with fluent speech, no focal motor/sensory deficits   Labs:  Recent Labs    09/18/19 0903 09/19/19 0058 09/20/19 0500  NA 136 134* 137  K 4.6 4.2 4.0  CL 107 106 111  CO2 17* 18* 22  GLUCOSE 216* 220* 131*  BUN 70* 54* 33*  CREATININE 1.66* 1.37* 1.06*  CALCIUM 6.9* 7.0* 7.2*  GFRNONAA 31* 39* 53*  GFRAA 36* 45* >60  PROT 5.4* 5.6* 4.9*  ALBUMIN 3.1* 3.2* 2.9*  AST 34 29 32  ALT 40 39 41  ALKPHOS 140* 119 84  BILITOT 0.4 0.8 0.8   I reviewed her GI upper endoscopy report

## 2019-09-21 ENCOUNTER — Encounter (HOSPITAL_COMMUNITY): Payer: Self-pay | Admitting: Gastroenterology

## 2019-09-21 LAB — TYPE AND SCREEN
ABO/RH(D): O NEG
Antibody Screen: NEGATIVE
Unit division: 0
Unit division: 0
Unit division: 0
Unit division: 0

## 2019-09-21 LAB — CBC
HCT: 26.7 % — ABNORMAL LOW (ref 36.0–46.0)
Hemoglobin: 8.7 g/dL — ABNORMAL LOW (ref 12.0–15.0)
MCH: 31.6 pg (ref 26.0–34.0)
MCHC: 32.6 g/dL (ref 30.0–36.0)
MCV: 97.1 fL (ref 80.0–100.0)
Platelets: 51 10*3/uL — ABNORMAL LOW (ref 150–400)
RBC: 2.75 MIL/uL — ABNORMAL LOW (ref 3.87–5.11)
RDW: 18.7 % — ABNORMAL HIGH (ref 11.5–15.5)
WBC: 6.9 10*3/uL (ref 4.0–10.5)
nRBC: 0.4 % — ABNORMAL HIGH (ref 0.0–0.2)

## 2019-09-21 LAB — BPAM RBC
Blood Product Expiration Date: 202108272359
Blood Product Expiration Date: 202108272359
Blood Product Expiration Date: 202109012359
Blood Product Expiration Date: 202109012359
ISSUE DATE / TIME: 202108071453
ISSUE DATE / TIME: 202108071849
ISSUE DATE / TIME: 202108090812
ISSUE DATE / TIME: 202108091036
Unit Type and Rh: 9500
Unit Type and Rh: 9500
Unit Type and Rh: 9500
Unit Type and Rh: 9500

## 2019-09-21 LAB — GLUCOSE, CAPILLARY
Glucose-Capillary: 104 mg/dL — ABNORMAL HIGH (ref 70–99)
Glucose-Capillary: 106 mg/dL — ABNORMAL HIGH (ref 70–99)

## 2019-09-21 LAB — MAGNESIUM: Magnesium: 2 mg/dL (ref 1.7–2.4)

## 2019-09-21 MED ORDER — PANTOPRAZOLE SODIUM 40 MG PO TBEC
40.0000 mg | DELAYED_RELEASE_TABLET | Freq: Two times a day (BID) | ORAL | Status: DC
  Administered 2019-09-21: 40 mg via ORAL
  Filled 2019-09-21: qty 1

## 2019-09-21 MED ORDER — HEPARIN SOD (PORK) LOCK FLUSH 100 UNIT/ML IV SOLN
500.0000 [IU] | INTRAVENOUS | Status: AC | PRN
  Administered 2019-09-21: 500 [IU]
  Filled 2019-09-21: qty 5

## 2019-09-21 NOTE — Discharge Summary (Signed)
Physician Discharge Summary  Olivia Benson GOV:[id number] DOB: [birth year] DOA: 09/17/2019  PCP: Gregor Hams, FNP  Admit date: 09/17/2019 Discharge date: 09/21/2019  Admitted From: Home Disposition: Home  Recommendations for Outpatient Follow-up:  1. Follow up with PCP in 1-2 weeks 2. Please obtain BMP/CBC in one week your next doctors visit.  3. Advised to resume home PPI to be taken at least 30 minutes before meal 4. CBC in 1 week and follow-up with oncology.   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  GI consulted, started on octreotide infusion.  Underwent EGD which showed minimal bleeding, remain on octreotide drip, PPI drip.  Hemoglobin trended down therefore received PRBC transfusion.  Over several days her bleeding subsided, hemodynamically remained stable.  Seen by oncology and GI in the hospital.  Patient was cleared to be discharged with outpatient follow-up recommendations as stated above.   Assessment & Plan:   Principal Problem:   Acute GI bleeding Active Problems:   Fatty liver disease, nonalcoholic   Essential hypertension   Disseminated ovarian cancer (HCC)   Diabetes mellitus with diabetic neuropathy, with long-term current use of insulin (HCC)   Chemotherapy-induced thrombocytopenia   Diabetes mellitus without complication (HCC)   Malignant neoplasm of peritoneum (HCC)  Acute upper GI bleed History of variceal bleeding Nash cirrhosis -Bleeding has subsided for now.  Transition to oral Protonix to be taken 30 minutes before meal.  Appreciate GI input. -Endoscopy 09/17/2019 showed minimal bleeding.    Acute blood loss anemia, subsided -Hemoglobin baseline around 10.0,  received 2 units PRBC transfusion on 8/7 and 2 more units on 8/9.  Hemoglobin now remained stable.  No further episodes or evidence of bleeding noted.  Cleared by GI to be discharged home.  Seen by oncology as well.  Diabetes mellitus type 2, uncontrolled secondary to hyperglycemia -Resume her home regimen  Primary progressive peritoneal carcinoma Thrombocytopenia with splenomegaly, chemo induced Iron deficiency anemia   -Oncology following, getting outpatient chemo.   Appreciate input during inpatient stay   Body mass index is 35.89 kg/m.         Discharge Diagnoses:  Principal Problem:   Acute GI bleeding Active Problems:   Fatty liver disease, nonalcoholic   Essential hypertension   Disseminated ovarian cancer (HCC)   Diabetes mellitus with diabetic neuropathy, with long-term current use of insulin (HCC)   Chemotherapy-induced thrombocytopenia   Diabetes mellitus without complication ([place])   Malignant neoplasm of peritoneum ([place])      Consultations:  GI  Oncology  Subjective: Feels great, no more dark melanotic stools.  Wishes to go home today.  Discharge Exam: Vitals:   09/21/19 0400 09/21/19 0800  BP:    Pulse:    Resp:    Temp: 98 F (36.7 C) 97.6 F (36.4 C)  SpO2:     Vitals:   09/21/19 0000 09/21/19 0400 09/21/19 0500 09/21/19 0800  BP:      Pulse:      Resp:      Temp: 98 F (36.7 C) 98 F (36.7 C)  97.6 F (36.4 C)  TempSrc: Oral Oral  Oral  SpO2:      Weight:   89 kg   Height:        General: Pt is alert, awake, not in acute  distress Cardiovascular: RRR, S1/S2 +, no rubs, no gallops Respiratory: CTA bilaterally, no wheezing, no rhonchi Abdominal: Soft, NT, ND, bowel sounds + Extremities: no edema, no cyanosis  Discharge Instructions   Allergies as of 09/21/2019      Reactions   Carboplatin Anaphylaxis   Shellfish Allergy Anaphylaxis   HER THROAT SWELLS UP AND CLOSES   Vancomycin Rash   Pt developed a large red rash  covering her entire arm after infusion. Suspected red-man syndrome vs infiltration   Benadryl [diphenhydramine Hcl] Other (See Comments)   Restless legs   Penicillins Rash   DID THE REACTION INVOLVE: Swelling of the face/tongue/throat, SOB, or low BP? No Sudden or severe rash/hives, skin peeling, or the inside of the mouth or nose? No Did it require medical treatment? No When did it last happen?Childhood allergy If all above answers are "NO", may proceed with cephalosporin use.      Medication List    TAKE these medications   cholecalciferol 1000 units tablet Commonly known as: VITAMIN D Take 1,000 Units by mouth at bedtime.   dexamethasone 4 MG tablet Commonly known as: DECADRON Start the day before Taxotere, take 1 tablet in the morning, and then 1 tablet daily after chemo for 2 days. Pls dispense 30 tabs What changed:   how much to take  how to take this  when to take this   gabapentin 300 MG capsule Commonly known as: NEURONTIN Take 3 capsules (900 mg total) by mouth 2 (two) times daily.   HumaLOG Mix 75/25 KwikPen (75-25) 100 UNIT/ML Kwikpen Generic drug: Insulin Lispro Prot & Lispro Inject 22 Units into the skin 2 (two) times daily. Before breakfast & before supper Except when taking Chemo take 32 units  For three to five days   HYDROcodone-acetaminophen 5-325 MG tablet Commonly known as: NORCO/VICODIN Take 1 tablet by mouth every 6 (six) hours as needed for moderate pain.   lidocaine-prilocaine cream Commonly known as: EMLA Apply to Porta-cath  1-2 hours prior to access as directed. What changed:   how much to take  how to take this  when to take this  reasons to take this  additional instructions   nadolol 20 MG tablet Commonly known as: Corgard Take 1 tablet (20 mg total) by mouth daily. What changed: when to take this   Neupro 4 MG/24HR Generic drug: rotigotine Place 1 patch onto the skin daily.   ondansetron 8 MG tablet Commonly known  as: Zofran Take 1 tablet (8 mg total) by mouth every 8 (eight) hours as needed. Start on the third day after chemotherapy. What changed: reasons to take this   pantoprazole 40 MG tablet Commonly known as: PROTONIX Take 1 tablet (40 mg total) by mouth daily.   ProAir HFA 108 (90 Base) MCG/ACT inhaler Generic drug: albuterol Inhale 2 puffs into the lungs every 4 (four) hours as needed for wheezing or shortness of breath.   prochlorperazine 10 MG tablet Commonly known as: COMPAZINE Take 1 tablet (10 mg total) by mouth every 6 (six) hours as needed (Nausea or vomiting).       Follow-up Information    Gregor Hams, FNP. Schedule an appointment as soon as possible for a visit in 1 week(s).   Specialty: Family Medicine Contact information: [place] [phone]              Allergies  Allergen Reactions  . Carboplatin Anaphylaxis  . Shellfish Allergy Anaphylaxis  HER THROAT SWELLS UP AND CLOSES  . Vancomycin Rash    Pt developed a large red rash covering her entire arm after infusion. Suspected red-man syndrome vs infiltration  . Benadryl [Diphenhydramine Hcl] Other (See Comments)    Restless legs   . Penicillins Rash    DID THE REACTION INVOLVE: Swelling of the face/tongue/throat, SOB, or low BP? No Sudden or severe rash/hives, skin peeling, or the inside of the mouth or nose? No Did it require medical treatment? No When did it last happen?Childhood allergy If all above answers are "NO", may proceed with cephalosporin use.     You were cared for by a hospitalist during your hospital stay. If you have any questions about your discharge medications or the care you received while you were in the hospital after you are discharged, you can call the unit and asked to speak with the hospitalist on call if the hospitalist that took care of you is not available. Once you are discharged, your primary care physician will handle any  further medical issues. Please note that no refills for any discharge medications will be authorized once you are discharged, as it is imperative that you return to your primary care physician (or establish a relationship with a primary care physician if you do not have one) for your aftercare needs so that they can reassess your need for medications and monitor your lab values.   Procedures/Studies:  No results found.   The results of significant diagnostics from this hospitalization (including imaging, microbiology, ancillary and laboratory) are listed below for reference.     Microbiology: Recent Results (from the past 240 hour(s))  SARS Coronavirus 2 by RT PCR (hospital order, performed in [facility] hospital lab) Nasopharyngeal Nasopharyngeal Swab     Status: None   Collection Time: 09/17/19  3:24 AM   Specimen: Nasopharyngeal Swab  Result Value Ref Range Status   SARS Coronavirus 2 NEGATIVE NEGATIVE Final    Comment: (NOTE) SARS-CoV-2 target nucleic acids are NOT DETECTED.  The SARS-CoV-2 RNA is generally detectable in upper and lower respiratory specimens during the acute phase of infection. The lowest concentration of SARS-CoV-2 viral copies this assay can detect is 250 copies / mL. A negative result does not preclude SARS-CoV-2 infection and should not be used as the sole basis for treatment or other patient management decisions.  A negative result may occur with improper specimen collection / handling, submission of specimen other than nasopharyngeal swab, presence of viral mutation(s) within the areas targeted by this assay, and inadequate number of viral copies (<250 copies / mL). A negative result must be combined with clinical observations, patient history, and epidemiological information.  Fact Sheet for Patients:   StrictlyIdeas.no  Fact Sheet for Healthcare Providers: BankingDealers.co.za  This test is not yet  approved or  cleared by the [place] FDA and has been authorized for detection and/or diagnosis of SARS-CoV-2 by FDA under an Emergency Use Authorization (EUA).  This EUA will remain in effect (meaning this test can be used) for the duration of the COVID-19 declaration under Section 564(b)(1) of the Act, 21 U.S.C. section 360bbb-3(b)(1), unless the authorization is terminated or revoked sooner.  Performed at [facility], [place] [address], [place], [place] [address]   MRSA PCR Screening     Status: None   Collection Time: 09/18/19  2:32 PM   Specimen: Nasal Mucosa; Nasopharyngeal  Result Value Ref Range Status   MRSA by PCR NEGATIVE NEGATIVE Final  Comment:        The GeneXpert MRSA Assay (FDA approved for NASAL specimens only), is one component of a comprehensive MRSA colonization surveillance program. It is not intended to diagnose MRSA infection nor to guide or monitor treatment for MRSA infections. Performed at [facility], [place] [address], [place], [place] [address]      Labs: BNP (last 3 results) No results for input(s): BNP in the last 8760 hours. Basic Metabolic Panel: Recent Labs  Lab 09/17/19 0224 09/18/19 0903 09/19/19 0058 09/20/19 0500 09/21/19 0620  NA 136 136 134* 137  --   K 4.8 4.6 4.2 4.0  --   CL 106 107 106 111  --   CO2 21* 17* 18* 22  --   GLUCOSE 314* 216* 220* 131*  --   BUN 51* 70* 54* 33*  --   CREATININE 0.99 1.66* 1.37* 1.06*  --   CALCIUM 8.0* 6.9* 7.0* 7.2*  --   MG  --  1.5* 2.6* 2.3 2.0   Liver Function Tests: Recent Labs  Lab 09/17/19 0224 09/18/19 0903 09/19/19 0058 09/20/19 0500  AST 22 34 29 32  ALT 24 40 39 41  ALKPHOS 139* 140* 119 84  BILITOT 0.9 0.4 0.8 0.8  PROT 5.4* 5.4* 5.6* 4.9*  ALBUMIN 3.3* 3.1* 3.2* 2.9*   No results for input(s): LIPASE, AMYLASE in the last 168 hours. No results for input(s): AMMONIA in the last 168 hours. CBC: Recent Labs  Lab 09/17/19 0224  09/17/19 0224 09/18/19 0903 09/19/19 0058 09/20/19 0500 09/20/19 1630 09/21/19 0620  WBC 28.9*  --  31.2* 20.3* 7.5  --  6.9  NEUTROABS 23.4*  --   --   --   --   --   --   HGB 7.2*   < > 5.7* 8.1* 6.8* 8.5* 8.7*  HCT 23.0*   < > 18.8* 25.0* 21.6* 26.1* 26.7*  MCV 99.6  --  102.2* 96.5 100.5*  --  97.1  PLT 135*  --  122* 86* 54*  --  51*   < > = values in this interval not displayed.   Cardiac Enzymes: No results for input(s): CKTOTAL, CKMB, CKMBINDEX, TROPONINI in the last 168 hours. BNP: Invalid input(s): POCBNP CBG: Recent Labs  Lab 09/20/19 1632 09/20/19 2017 09/20/19 2336 09/21/19 0312 09/21/19 0803  GLUCAP 193* 172* 124* 104* 106*   D-Dimer No results for input(s): DDIMER in the last 72 hours. Hgb A1c No results for input(s): HGBA1C in the last 72 hours. Lipid Profile No results for input(s): CHOL, HDL, LDLCALC, TRIG, CHOLHDL, LDLDIRECT in the last 72 hours. Thyroid function studies No results for input(s): TSH, T4TOTAL, T3FREE, THYROIDAB in the last 72 hours.  Invalid input(s): FREET3 Anemia work up No results for input(s): VITAMINB12, FOLATE, FERRITIN, TIBC, IRON, RETICCTPCT in the last 72 hours. Urinalysis    Component Value Date/Time   COLORURINE YELLOW 01/02/2019 2029   APPEARANCEUR CLEAR 01/02/2019 2029   LABSPEC 1.017 01/02/2019 2029   PHURINE 5.0 01/02/2019 2029   GLUCOSEU 50 (A) 01/02/2019 2029   HGBUR SMALL (A) 01/02/2019 2029   BILIRUBINUR NEGATIVE 01/02/2019 2029   KETONESUR NEGATIVE 01/02/2019 2029   PROTEINUR 30 (A) 01/02/2019 2029   UROBILINOGEN 0.2 03/22/2014 0917   NITRITE NEGATIVE 01/02/2019 2029   LEUKOCYTESUR NEGATIVE 01/02/2019 2029   Sepsis Labs Invalid input(s): PROCALCITONIN,  WBC,  LACTICIDVEN Microbiology Recent Results (from the past 240 hour(s))  SARS Coronavirus 2 by RT PCR (hospital order,  performed in [facility] hospital lab) Nasopharyngeal Nasopharyngeal Swab     Status: None   Collection Time: 09/17/19  3:24 AM    Specimen: Nasopharyngeal Swab  Result Value Ref Range Status   SARS Coronavirus 2 NEGATIVE NEGATIVE Final    Comment: (NOTE) SARS-CoV-2 target nucleic acids are NOT DETECTED.  The SARS-CoV-2 RNA is generally detectable in upper and lower respiratory specimens during the acute phase of infection. The lowest concentration of SARS-CoV-2 viral copies this assay can detect is 250 copies / mL. A negative result does not preclude SARS-CoV-2 infection and should not be used as the sole basis for treatment or other patient management decisions.  A negative result may occur with improper specimen collection / handling, submission of specimen other than nasopharyngeal swab, presence of viral mutation(s) within the areas targeted by this assay, and inadequate number of viral copies (<250 copies / mL). A negative result must be combined with clinical observations, patient history, and epidemiological information.  Fact Sheet for Patients:   StrictlyIdeas.no  Fact Sheet for Healthcare Providers: BankingDealers.co.za  This test is not yet approved or  cleared by the [place] FDA and has been authorized for detection and/or diagnosis of SARS-CoV-2 by FDA under an Emergency Use Authorization (EUA).  This EUA will remain in effect (meaning this test can be used) for the duration of the COVID-19 declaration under Section 564(b)(1) of the Act, 21 U.S.C. section 360bbb-3(b)(1), unless the authorization is terminated or revoked sooner.  Performed at [facility], [place] [address], [place], [place] [address]   MRSA PCR Screening     Status: None   Collection Time: 09/18/19  2:32 PM   Specimen: Nasal Mucosa; Nasopharyngeal  Result Value Ref Range Status   MRSA by PCR NEGATIVE NEGATIVE Final    Comment:        The GeneXpert MRSA Assay (FDA approved for NASAL specimens only), is one component of a comprehensive MRSA  colonization surveillance program. It is not intended to diagnose MRSA infection nor to guide or monitor treatment for MRSA infections. Performed at [facility], [place] [address], [place], [place] [address]      Time coordinating discharge:  I have spent 35 minutes face to face with the patient and on the ward discussing the patients care, assessment, plan and disposition with other care givers. >50% of the time was devoted counseling the patient about the risks and benefits of treatment/Discharge disposition and coordinating care.   SIGNED:   Damita Lack, MD  [facility] 09/21/2019, 10:02 AM   If 7PM-7AM, please contact night-coverage

## 2019-09-21 NOTE — Progress Notes (Signed)
Pt had supplied medication down in our Pharmacy but patient's husband brought a limited supply and when called pharmacy they did not have anymore of patient's supplied medication. Pt is aware and states, "I thought I was probably out."

## 2019-09-21 NOTE — TOC Initial Note (Signed)
Transition of Care [facility]) - Initial/Assessment Note    Patient Details  Name: Olivia Benson MRN: [id number] Date of Birth: [birth year]  Transition of [facility]) CM/SW Contact:    Leeroy Cha, RN Phone Number: 09/21/2019, 9:51 AM  Clinical Narrative:                 Gi bleed now control, iv Sandostatin , one unit of prbc hgb 8.7. Following for progression and for toc needs. From home has pcp plan is to return home with husband.  Expected Discharge Plan: Home/Self Care Barriers to Discharge: Continued Medical Work up   Patient Goals and CMS Choice Patient states their goals for this hospitalization and ongoing recovery are:: to go home CMS Medicare.gov Compare Post Acute Care list provided to:: Patient Choice offered to / list presented to : Patient  Expected Discharge Plan and Services Expected Discharge Plan: Home/Self Care   Discharge Planning Services: CM Consult   Living arrangements for the past 2 months: Single Family Home                                      Prior Living Arrangements/Services Living arrangements for the past 2 months: Single Family Home Lives with:: Spouse Patient language and need for interpreter reviewed:: Yes Do you feel safe going back to the place where you live?: Yes      Need for Family Participation in Patient Care: Yes (Comment) Care giver support system in place?: Yes (comment)   Criminal Activity/Legal Involvement Pertinent to Current Situation/Hospitalization: No - Comment as needed  Activities of Daily Living Home Assistive Devices/Equipment: Bedside commode/3-in-1, Eyeglasses, CBG Meter, Grab bars in shower, Walker (specify type), Crutches (front wheeled walker) ADL Screening (condition at time of admission) Patient's cognitive ability adequate to safely complete daily activities?: Yes Is the patient deaf or have difficulty hearing?: No Does the patient have difficulty seeing, even when wearing glasses/contacts?:  No Does the patient have difficulty concentrating, remembering, or making decisions?: No Patient able to express need for assistance with ADLs?: Yes Does the patient have difficulty dressing or bathing?: Yes Independently performs ADLs?: No Communication: Independent Dressing (OT): Needs assistance Is this a change from baseline?: Change from baseline, expected to last >3 days Grooming: Needs assistance Is this a change from baseline?: Change from baseline, expected to last >3 days Feeding: Needs assistance Is this a change from baseline?: Change from baseline, expected to last >3 days Bathing: Needs assistance Is this a change from baseline?: Change from baseline, expected to last >3 days Toileting: Needs assistance Is this a change from baseline?: Change from baseline, expected to last >3days In/Out Bed: Needs assistance Is this a change from baseline?: Change from baseline, expected to last >3 days Walks in Home: Needs assistance Is this a change from baseline?: Change from baseline, expected to last >3 days Does the patient have difficulty walking or climbing stairs?: Yes (secondary to weakness) Weakness of Legs: Both Weakness of Arms/Hands: None  Permission Sought/Granted                  Emotional Assessment Appearance:: Appears stated age     Orientation: : Oriented to Self, Oriented to Place, Oriented to  Time, Oriented to Situation Alcohol / Substance Use: Not Applicable Psych Involvement: No (comment)  Admission diagnosis:  Acute upper GI bleeding [K92.2] Acute GI bleeding [K92.2] Anemia, unspecified type [D64.9] Bleeding esophageal  varices, unspecified esophageal varices type ([place]) [I85.01] Patient Active Problem List   Diagnosis Date Noted  . Acute GI bleeding 09/17/2019  . Bilateral edema of lower extremity 04/14/2019  . Preventive measure 03/03/2019  . Syncope, vasovagal 01/04/2019  . Phlebitis after infusion 01/04/2019  . Acute lower UTI 01/04/2019   . Peritoneal carcinomatosis ([place]) 01/04/2019  . AKI (acute kidney injury) ([place]) 12/31/2018  . SAH (subarachnoid hemorrhage) ([place]) 12/31/2018  . Antineoplastic chemotherapy induced pancytopenia ([place]) 12/31/2018  . Severe sepsis with septic shock ([place]) 12/31/2018  . Diarrhea 12/31/2018  . Redness and swelling of upper arm, right 12/31/2018  . Hypocalcemia 12/31/2018  . Coagulopathy ([place])   . Mucositis due to antineoplastic therapy   . Esophageal varices ([place]) 03/23/2018  . Chronic diastolic CHF (congestive heart failure) ([place]) 03/09/2018  . Fall 03/08/2018  . Displaced fracture of distal end of right fibula 03/08/2018  . GIB (gastrointestinal bleeding) 02/09/2018  . Obesity due to excess calories 03/20/2017  . Cardiomyopathy ([place]) 01/24/2017  . Sinus congestion 01/24/2017  . 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 Goals of care, counseling/discussion 09/03/2016  . Thrombocytopenia ([place]) 04/15/2016  . Vasomotor instability 01/17/2016  . Osteopenia determined by x-ray 12/22/2015  . Encounter for antineoplastic chemotherapy 12/09/2015  . Chemotherapy-induced neuropathy ([place]) 10/01/2015  . Elevated tumor markers 10/01/2015  . Obesity (BMI 35.0-39.9 without comorbidity) 05/31/2015  . Port catheter in place 03/29/2015  . Iron deficiency anemia 03/05/2015  . Low back pain 03/05/2015  . Neuropathic pain of foot 11/21/2014  . Anemia associated with chemotherapy 07/03/2014  . Diabetes mellitus without complication ([place]) 20/94/7096  . Portacath in place 05/10/2014  . Leukopenia due to antineoplastic chemotherapy ([place]) 05/10/2014  . Splenomegaly 05/10/2014  . Primary peritoneal carcinomatosis ([place]) 05/08/2014  . Malignant neoplasm of peritoneum ([place]) 05/08/2014  . Genetic testing 03/16/2014  . Neutropenic fever ([place]) 01/27/2014  . Pancytopenia, acquired ([place]) 01/27/2014  . Fever and neutropenia ([place]) 01/27/2014  . Asthma 01/27/2014  . Malignant ascites 01/25/2014  .  Chemotherapy induced neutropenia ([place]) 01/25/2014  . Chemotherapy-induced thrombocytopenia 01/25/2014  . RLS (restless legs syndrome) 01/25/2014  . Liver cirrhosis secondary to NASH ([place]) 01/02/2014  . Nonalcoholic steatohepatitis ([facility]) 01/02/2014  . Disseminated ovarian cancer ([place]) 12/03/2013  . Diabetes mellitus with diabetic neuropathy, with long-term current use of insulin ([place]) 12/03/2013  . Ascites 11/15/2013  . Elevated cancer antigen 125 (CA-125) 11/15/2013  . Cough variant asthma vs UACS 09/02/2013  . Essential hypertension 09/02/2013  . Dyspnea 10/22/2012  . Fatty liver disease, nonalcoholic 28/36/6294  . S/P cardiac cath 11/06/2010  . Personal history of surgery to heart and great vessels, presenting hazards to health 11/06/2010   PCP:  Gregor Hams, FNP Pharmacy:   [facility] [place], [place] [address] [place] [address] Phone: [phone] Fax: [phone]  [facility] [phone] [place], [place] - [place] [facility] [place] [address] [place] [address] Phone: [phone] Fax: [phone]     Social Determinants of Health ([place]) Interventions    Readmission Risk Interventions Readmission Risk Prevention Plan 01/04/2019 12/31/2018  Transportation Screening Complete Complete  Medication Review (RN Care Manager) Referral to Pharmacy Referral to Pharmacy  PCP or Specialist appointment within 3-5 days of discharge Complete Not Complete  PCP/Specialist Appt Not Complete comments Alvy Bimler with oncology will see her later this week DC date unknown but established with  pcp and cancer center  [place] or Home Care Consult Complete Not Complete  HRI or Home Care Consult Pt Refusal Comments - pending need  SW Recovery Care/Counseling Consult Not Complete Not Complete  SW Consult Not Complete Comments No consult orders/need  pending need  Palliative Care Screening Not Applicable Not Complete  Comments - pending need  Skilled Nursing Facility Not Applicable Not Complete  SNF Comments - pending need  Some recent data might be hidden

## 2019-09-21 NOTE — Progress Notes (Signed)
Olivia Benson 10:23 AM  Subjective: Patient doing well and wants to go home no signs of bleeding Case discussed with the hospital team and we discussed her at home medicines and answered all of her questions and patient does not think she needs PT and believes she has everything at home  Objective: Vital signs stable afebrile no acute distress abdomen is soft nontender hemoglobin stable  Assessment: Resolved GI bleeding and patient on chemotherapy with cirrhosis  Plan: Hopefully pharmacy will finish weaning her octreotide and okay with me to change her to oral Protonix and please call me if I could be of any further assistance with this hospital stay  [facility] [place] [facility]  office [phone] After 5PM or if no answer call [phone]

## 2019-09-26 ENCOUNTER — Other Ambulatory Visit: Payer: Self-pay | Admitting: Neurology

## 2019-09-28 ENCOUNTER — Inpatient Hospital Stay: Payer: Medicare Other

## 2019-09-28 ENCOUNTER — Inpatient Hospital Stay: Payer: Medicare Other | Attending: Hematology and Oncology | Admitting: Hematology and Oncology

## 2019-09-28 ENCOUNTER — Other Ambulatory Visit: Payer: Self-pay

## 2019-09-28 ENCOUNTER — Telehealth: Payer: Self-pay | Admitting: Hematology and Oncology

## 2019-09-28 ENCOUNTER — Encounter: Payer: Self-pay | Admitting: Hematology and Oncology

## 2019-09-28 VITALS — BP 143/61 | HR 69 | Temp 97.9°F | Resp 18 | Ht 62.0 in | Wt 201.8 lb

## 2019-09-28 DIAGNOSIS — D509 Iron deficiency anemia, unspecified: Secondary | ICD-10-CM

## 2019-09-28 DIAGNOSIS — R161 Splenomegaly, not elsewhere classified: Secondary | ICD-10-CM | POA: Insufficient documentation

## 2019-09-28 DIAGNOSIS — Z79899 Other long term (current) drug therapy: Secondary | ICD-10-CM | POA: Diagnosis not present

## 2019-09-28 DIAGNOSIS — C482 Malignant neoplasm of peritoneum, unspecified: Secondary | ICD-10-CM

## 2019-09-28 DIAGNOSIS — R531 Weakness: Secondary | ICD-10-CM | POA: Insufficient documentation

## 2019-09-28 DIAGNOSIS — K7030 Alcoholic cirrhosis of liver without ascites: Secondary | ICD-10-CM | POA: Insufficient documentation

## 2019-09-28 DIAGNOSIS — C569 Malignant neoplasm of unspecified ovary: Secondary | ICD-10-CM

## 2019-09-28 DIAGNOSIS — I700 Atherosclerosis of aorta: Secondary | ICD-10-CM | POA: Insufficient documentation

## 2019-09-28 DIAGNOSIS — C786 Secondary malignant neoplasm of retroperitoneum and peritoneum: Secondary | ICD-10-CM | POA: Insufficient documentation

## 2019-09-28 DIAGNOSIS — K7581 Nonalcoholic steatohepatitis (NASH): Secondary | ICD-10-CM | POA: Diagnosis not present

## 2019-09-28 DIAGNOSIS — D61818 Other pancytopenia: Secondary | ICD-10-CM | POA: Insufficient documentation

## 2019-09-28 DIAGNOSIS — K7460 Unspecified cirrhosis of liver: Secondary | ICD-10-CM

## 2019-09-28 DIAGNOSIS — Z7189 Other specified counseling: Secondary | ICD-10-CM

## 2019-09-28 DIAGNOSIS — M545 Low back pain, unspecified: Secondary | ICD-10-CM

## 2019-09-28 DIAGNOSIS — Z9221 Personal history of antineoplastic chemotherapy: Secondary | ICD-10-CM | POA: Insufficient documentation

## 2019-09-28 DIAGNOSIS — R600 Localized edema: Secondary | ICD-10-CM | POA: Insufficient documentation

## 2019-09-28 DIAGNOSIS — G8929 Other chronic pain: Secondary | ICD-10-CM | POA: Insufficient documentation

## 2019-09-28 LAB — CBC WITH DIFFERENTIAL (CANCER CENTER ONLY)
Abs Immature Granulocytes: 0.02 10*3/uL (ref 0.00–0.07)
Basophils Absolute: 0 10*3/uL (ref 0.0–0.1)
Basophils Relative: 0 %
Eosinophils Absolute: 0 10*3/uL (ref 0.0–0.5)
Eosinophils Relative: 0 %
HCT: 28.7 % — ABNORMAL LOW (ref 36.0–46.0)
Hemoglobin: 8.9 g/dL — ABNORMAL LOW (ref 12.0–15.0)
Immature Granulocytes: 0 %
Lymphocytes Relative: 14 %
Lymphs Abs: 0.7 10*3/uL (ref 0.7–4.0)
MCH: 30.2 pg (ref 26.0–34.0)
MCHC: 31 g/dL (ref 30.0–36.0)
MCV: 97.3 fL (ref 80.0–100.0)
Monocytes Absolute: 0.4 10*3/uL (ref 0.1–1.0)
Monocytes Relative: 9 %
Neutro Abs: 3.6 10*3/uL (ref 1.7–7.7)
Neutrophils Relative %: 77 %
Platelet Count: 85 10*3/uL — ABNORMAL LOW (ref 150–400)
RBC: 2.95 MIL/uL — ABNORMAL LOW (ref 3.87–5.11)
RDW: 17.3 % — ABNORMAL HIGH (ref 11.5–15.5)
WBC Count: 4.7 10*3/uL (ref 4.0–10.5)
nRBC: 0 % (ref 0.0–0.2)

## 2019-09-28 LAB — CMP (CANCER CENTER ONLY)
ALT: 46 U/L — ABNORMAL HIGH (ref 0–44)
AST: 31 U/L (ref 15–41)
Albumin: 3.3 g/dL — ABNORMAL LOW (ref 3.5–5.0)
Alkaline Phosphatase: 99 U/L (ref 38–126)
Anion gap: 9 (ref 5–15)
BUN: 16 mg/dL (ref 8–23)
CO2: 23 mmol/L (ref 22–32)
Calcium: 8.7 mg/dL — ABNORMAL LOW (ref 8.9–10.3)
Chloride: 111 mmol/L (ref 98–111)
Creatinine: 0.94 mg/dL (ref 0.44–1.00)
GFR, Est AFR Am: >60 mL/min (ref >60)
GFR, Estimated: >60 mL/min (ref >60)
Glucose, Bld: 137 mg/dL — ABNORMAL HIGH (ref 70–99)
Potassium: 4 mmol/L (ref 3.5–5.1)
Sodium: 143 mmol/L (ref 135–145)
Total Bilirubin: 0.7 mg/dL (ref 0.3–1.2)
Total Protein: 5.9 g/dL — ABNORMAL LOW (ref 6.5–8.1)

## 2019-09-28 LAB — IRON AND TIBC
Iron: 60 ug/dL (ref 41–142)
Saturation Ratios: 17 % — ABNORMAL LOW (ref 21–57)
TIBC: 354 ug/dL (ref 236–444)
UIBC: 293 ug/dL (ref 120–384)

## 2019-09-28 LAB — FERRITIN: Ferritin: 53 ng/mL (ref 11–307)

## 2019-09-28 MED ORDER — HYDROCODONE-ACETAMINOPHEN 5-325 MG PO TABS: 1.0000 | ORAL_TABLET | Freq: Four times a day (QID) | ORAL | 0 refills | Status: DC | PRN

## 2019-09-28 MED ORDER — HEPARIN SOD (PORK) LOCK FLUSH 100 UNIT/ML IV SOLN
500.0000 [IU] | Freq: Once | INTRAVENOUS | Status: AC
  Administered 2019-09-28: 500 [IU]
  Filled 2019-09-28: qty 5

## 2019-09-28 MED ORDER — SODIUM CHLORIDE 0.9% FLUSH
10.0000 mL | Freq: Once | INTRAVENOUS | Status: AC
  Administered 2019-09-28: 10 mL
  Filled 2019-09-28: qty 10

## 2019-09-28 NOTE — Assessment & Plan Note (Signed)
She is noted to have extensive bruises Her liver function is stable Observe for now

## 2019-09-28 NOTE — Assessment & Plan Note (Signed)
She is still weak and not fully recovered from recent hospitalization Her pancytopenia is stable I recommend delaying chemo for at least another month In 1 month, I will see her with repeat blood draw and if she is ready to resume treatment, I plan to order CT imaging to assess objective assessment of response to therapy before resumption of treatment She is in agreement

## 2019-09-28 NOTE — Assessment & Plan Note (Signed)
This is multifactorial, 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, recent bleeding and chemotherapy She is weak She does not need transfusion support or IV iron Observe only for now

## 2019-09-28 NOTE — Progress Notes (Signed)
[place] OFFICE PROGRESS NOTE  Patient Care Team: Gregor Hams, FNP as PCP - General (Family Medicine) Tat, Eustace Quail, DO as Consulting Physician (Neurology)  ASSESSMENT & PLAN:  Primary peritoneal carcinomatosis [facility]) She is still weak and not fully recovered from recent hospitalization Her pancytopenia is stable I recommend delaying chemo for at least another month In 1 month, I will see her with repeat blood draw and if she is ready to resume treatment, I plan to order CT imaging to assess objective assessment of response to therapy before resumption of treatment She is in agreement  Pancytopenia, acquired [facility]) This is multifactorial, 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, recent bleeding and chemotherapy She is weak She does not need transfusion support or IV iron Observe only for now  Liver cirrhosis secondary to NASH [facility]) She is noted to have extensive bruises Her liver function is stable Observe for now  Low back pain She is taking chronic hydrocodone for chronic back pain I refill her prescription today We discussed narcotic refill policy   No orders of the defined types were placed in this encounter.   All questions were answered. The patient knows to call the clinic with any problems, questions or concerns. The total time spent in the appointment was 25 minutes encounter with patients including review of chart and various tests results, discussions about plan of care and coordination of care plan   [name], MD 09/28/2019 9:47 AM  INTERVAL HISTORY: Please see below for problem oriented charting. She returns to resume treatment after recent hospitalization She complained of weakness She have extensive bruises Her leg are swollen She complained of feeling tired The patient denies any recent signs or symptoms of bleeding such as spontaneous epistaxis, hematuria or hematochezia.   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 Had allergic reaction to carboplatin. Avastin was stopped due to GI hemorrhage   Primary peritoneal carcinomatosis ([place])  09/06/2011 Imaging   CT findings consistent with cirrhotic changes involving the liver.  No worrisome liver mass.  There are associated portal venous collaterals and splenomegaly consistent with portal venous hypertension. 2.  No other significant upper abdominal findings   11/08/2013 Imaging   US showed ascites   11/08/2013 Initial Diagnosis   Patient had ~ 2 months of some urinary incontinence, saw Dr Ronita Hipps in 10-2013, with pelvic mass on exam    11/12/2013 Imaging   There are findings of progressive hepatic cirrhosis with a large amount of ascites and mild splenomegaly. There are venous collaterals present. 2. There is abnormal thickening of the peritoneal surface along the left mid and lower abdominal wall. There is abnormal soft tissue density in the pelvis as well which could reflect the clinically suspected ovarian malignancy but the findings are nondiagnostic. 3. There is a new small left pleural effusion. 4. There is no acute bowel abnormality.   11/18/2013 Imaging   Successful ultrasound guided diagnostic and therapeutic paracentesis yielding 4.1 liters of ascites.    11/18/2013 Pathology Results   PERITONEAL/ASCITIC FLUID (SPECIMEN 1 OF 1 COLLECTED 11/18/13): SEROUS CARCINOMA, PLEASE SEE COMMENT.   12/01/2013 Tumor Marker   Patient's tumor was tested for the following markers: CA125 Results of the tumor marker test revealed 1938   12/03/2013 Imaging   Successful ultrasound-guided therapeutic paracentesis yielding 3.6 liters of peritoneal fluid.   12/07/2013 - 05/30/2014 Chemotherapy   She received 3 cycles of carboplatin and Taxol, interrupted  cycle 4 for surgery.  She subsequently completed 3 more cycles of chemotherapy after surgery   12/20/2013 Imaging   Successful  ultrasound-guided diagnostic and therapeutic paracentesis yielding 2.2 liters of peritoneal fluid   12/20/2013 Pathology Results   PERITONEAL/ASCITIC FLUID(SPECIMEN 1 OF 1 COLLECTED 12/20/13): MALIGNANT CELLS CONSISTENT WITH SEROUS CARCINOMA   01/13/2014 Tumor Marker   Patient's tumor was tested for the following markers: CA125 Results of the tumor marker test revealed 227   02/07/2014 Tumor Marker   Patient's tumor was tested for the following markers: CA125 Results of the tumor marker test revealed 130   02/15/2014 Genetic Testing   Genetics testing from 02-2014 normal ([employer] panel)   02/23/2014 Imaging   Interval decrease in ascites. 2. Morphologic changes in the liver consistent with cirrhosis. 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 Portal vein remains patent at this time. 3. Persistent but decreased abnormal soft tissue attenuation tracking in the omental fat. This may be secondary to interval improvement in metastatic disease. 4. Peritoneal thickening seen in the left abdomen and pelvis on the previous study has decreased and nearly resolved in the interval. This also suggests interval improvement in metastatic disease.    03/07/2014 Procedure   Ultrasound and fluoroscopically guided right internal jugular single lumen power port catheter insertion. Tip in the SVC/RA junction. Catheter ready for use.   03/17/2014 Tumor Marker   Patient's tumor was tested for the following markers: CA125 Results of the tumor marker test revealed 45   03/29/2014 Pathology Results   1. Omentum, resection for tumor - HIGH GRADE SEROUS CARCINOMA, SEE COMMENT. 2. Ovary and fallopian tube, right - HIGH GRADE SEROUS CARCINOMA, SEE COMMENT. 3. Ovary and fallopian tube, left - HIGH GRADE SEROUS CARCINOMA, SEE COMMENT. Diagnosis Note 1. Nests and clusters of malignant cells are invading the omental tissue (part #1) with associated fibrosis. The cells are pleomorphic with  prominent nucleoli. There are scattered psammoma bodies. The ovaries are atrophic and exhibit multiple foci of surface based invasive carcinoma and associated fibrosis. The fallopian tubes have a few foci of carcinoma, also superficially located. There are no precursor lesions noted in the ovaries or fallopian tubes (the entire tubes and ovaries were submitted for evaluation). While there is some retraction artifact, there are several foci suspicious for lymphovascular invasion. 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 Given the fibrosis, there does appear to be a small amount of treatment effect, however, there is abundant residual tumor.    03/29/2014 Surgery   Procedure(s) Performed: 1. Exploratory laparotomy with bilateral salpingo-oophorectomy, omentectomy radical tumor debulking for ovarian cancer .  Surgeon: Thereasa Solo, MD.  Assistant Surgeon: Lahoma Crocker, M.D. Assistant: (an MD assistant was necessary for tissue manipulation, retraction and positioning due to the complexity of the case and hospital policies).  Operative Findings: 10cm omental cake from hepatic to splenic flexure, densely adherent to transverse colon. Milliary studding of tumor implants (<45m, too numerous in number to count) adherent to the mesentery of the small bowel and small bowel wall. Small volume (100cc) ascites. Small ovaries bilaterally, densely adherent to the pelvic cul de sac. Left ovary and tube densely adherent to sigmoid colon. Cirrhotic liver with hepatomegaly and splenomegaly.     This represented an optimal cytoreduction (R1) with visible disease residual on the bowel wall and mesentery (millial, <366mimplants).     04/18/2014  Tumor Marker   Patient's tumor was tested for the following markers: CA125 Results of the tumor marker test revealed 122   05/16/2014 Tumor  Marker   Patient's tumor was tested for the following markers: CA125 Results of the tumor marker test revealed 30   06/10/2014 Tumor Marker   Patient's tumor was tested for the following markers: CA125 Results of the tumor marker test revealed 19   07/04/2014 Imaging   Interval improvement in the appearance of peritoneal metastasis secondary to ovarian cancer. 2. 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    08/18/2014 Tumor Marker   Patient's tumor was tested for the following markers: CA125 Results of the tumor marker test revealed 16   10/13/2014 Tumor Marker   Patient's tumor was tested for the following markers: CA125 Results of the tumor marker test revealed 14   11/21/2014 Tumor Marker   Patient's tumor was tested for the following markers: CA125 Results of the tumor marker test revealed 16   12/28/2014 Tumor Marker   Patient's tumor was tested for the following markers: CA125 Results of the tumor marker test revealed 762   02/27/2015 Tumor Marker   Patient's tumor was tested for the following markers: CA125 Results of the tumor marker test revealed 20.2   03/22/2015 Imaging   Filler appearance to the prior exam, with very subtle fluid tracking along portions of the liver, and very minimal nodularity along the right paracolic gutter representing residua from the prior peritoneal cancer. The current abnormalities could simply be post therapy findings rather than necessarily representing residual malignancy. No new or enlarging lesions are identified. 2. Hepatic cirrhosis and splenomegaly. There is some gastric varices suggesting portal venous hypertension. 3. Left foraminal stenosis at L4-5 due to spurring. There is likely also some central narrowing of the thecal sac at this level. 4. Bibasilar scarring. 5. Chronic mild left mid kidney scarring   03/27/2015 Tumor Marker   Patient's tumor was tested for the following markers: CA125 Results of the tumor marker  test revealed 25.3   04/24/2015 Imaging   Disc bulge L2-3 with mild spinal stenosis and right foraminal encroachment. 2. Disc bulge L3-4 with mild spinal stenosis. 3. Multifactorial spinal, left lateral recess and foraminal stenosis L4-5. There is grade 1 anterolisthesis without evident dynamic instability.   05/29/2015 Tumor Marker   Patient's tumor was tested for the following markers: CA125 Results of the tumor marker test revealed 29.9   08/21/2015 Tumor Marker   Patient's tumor was tested for the following markers: CA125 Results of the tumor marker test revealed 45   09/18/2015 Tumor Marker   Patient's tumor was tested for the following markers: CA125 Results of the tumor marker test revealed 47   09/27/2015 Imaging   New small bowel mesenteric nodule and enlarged left external iliac lymph node, highly worrisome for metastatic disease. 2. Cirrhosis and splenomegaly   10/10/2015 Tumor Marker   Patient's tumor was tested for the following markers: CA125 Results of the tumor marker test revealed 53.4   10/10/2015 - 12/11/2015 Chemotherapy   She received 4 cycles of carboplatin only   11/20/2015 Tumor Marker   Patient's tumor was tested for the following markers: CA125 Results of the tumor marker test revealed 41.3   12/20/2015 Imaging   CT: Mild left external iliac lymphadenopathy is slightly decreased. Mildly enlarged lower mesenteric nodule is slightly decreased. 2. No new or progressive metastatic disease in the abdomen or pelvis. 3. Cirrhosis. Stable subcentimeter hypodense left liver lobe  lesion. No new liver lesions. 4. Stable mild splenomegaly.  No ascites. 5. Mild sigmoid diverticulosis.   01/01/2016 -  Anti-estrogen oral therapy   She was placed on tamoxifen. Had letrozole initially but switched to tamoxifen due to poor tolerance   01/15/2016 Tumor Marker   Patient's tumor was tested for the following markers: CA125 Results of the tumor marker test revealed 31.4   02/19/2016  Tumor Marker   Patient's tumor was tested for the following markers: CA125 Results of the tumor marker test revealed 36.5   05/13/2016 Imaging   No evidence of disease progression within the abdomen or pelvis. Previously noted small central mesenteric nodule and left external iliac node appear slightly smaller. 2. Stable changes of hepatic cirrhosis and portal hypertension with associated splenomegaly. No new or enlarging hepatic lesions are identified. 3. No acute findings.   05/13/2016 Tumor Marker   Patient's tumor was tested for the following markers: CA125 Results of the tumor marker test revealed 41.2   06/24/2016 Tumor Marker   Patient's tumor was tested for the following markers: CA125 Results of the tumor marker test revealed 47.3   08/20/2016 Imaging   1. The left external iliac lymph node is slightly increased in size compared to the prior exam, currently 1.1 cm and previously 0.9 cm in diameter. 2. Stable central mesenteric lymph node at 0.9 cm in diameter. 3. Stable trace free pelvic fluid and stable slight thickening along the right paracolic gutter, without well-defined peritoneal nodularity. 4. Other imaging findings of potential clinical significance: Subsegmental atelectasis or scarring in the lung bases. Hepatic cirrhosis. Left renal scarring. Mild splenomegaly. Sigmoid colon diverticulosis. Impingement at L4-5 due to spondylosis and degenerative disc disease broad Schmorl' s nodes at L3-L4. Pelvic floor laxity   08/23/2016 Tumor Marker   Patient's tumor was tested for the following markers: CA125 Results of the tumor marker test revealed 80.2   09/04/2016 Imaging   LV EF: 60% -   65%   09/12/2016 Tumor Marker   Patient's tumor was tested for the following markers: CA125 Results of the tumor marker test revealed 98.5   09/12/2016 - 01/29/2018 Chemotherapy   The patient received Doxil and Avastin. Avastin was discontinued in Dec 2019 due to GI hemorrhage   09/26/2016 Tumor  Marker   Patient's tumor was tested for the following markers: CA125 Results of the tumor marker test revealed 68.9   10/10/2016 Tumor Marker   Patient's tumor was tested for the following markers: CA125 Results of the tumor marker test revealed 65.6   11/07/2016 Tumor Marker   Patient's tumor was tested for the following markers: CA125 Results of the tumor marker test revealed 46.4   11/29/2016 Imaging   Stable mild peritoneal thickening, suspicious for peritoneal carcinomatosis. No new or progressive disease identified. No evidence of ascites.  No significant change in 10 mm left external iliac and 8 mm mesenteric lymph nodes.  Stable hepatic cirrhosis, and splenomegaly consistent with portal venous hypertension.  Colonic diverticulosis, without radiographic evidence of diverticulitis.   12/02/2016 Imaging   Normal LV size with EF 55%. Basal inferior and basal inferoseptal hypokinesis. Normal RV size and systolic function. No significant valvular abnormalities.   12/05/2016 Tumor Marker   Patient's tumor was tested for the following markers: CA125 Results of the tumor marker test revealed 49.1   01/09/2017 Tumor Marker   Patient's tumor was tested for the following markers: CA125 Results of the tumor marker test revealed 47.2   02/06/2017 Tumor Marker  Patient's tumor was tested for the following markers: CA125 Results of the tumor marker test revealed 44.1   02/18/2017 Imaging   Stable mild peritoneal thickening. No new or progressive disease identified within the abdomen or pelvis.  Stable tiny sub-cm left external iliac and mesenteric lymph nodes.  Stable hepatic cirrhosis, and splenomegaly consistent with portal venous hypertension. No evidence of hepatic neoplasm.  Colonic diverticulosis, without radiographic evidence of diverticulitis.   03/06/2017 Tumor Marker   Patient's tumor was tested for the following markers: CA125 Results of the tumor marker test revealed  50.4   03/17/2017 Imaging   - Left ventricle: The cavity size was normal. Wall thickness was normal. Systolic function was normal. The estimated ejection fraction was in the range of 55% to 60%. Wall motion was normal; there were no regional wall motion abnormalities. Features are consistent with a pseudonormal left ventricular filling pattern, with concomitant abnormal relaxation and increased filling pressure (grade 2 diastolic dysfunction). - Mitral valve: There was mild regurgitation. - Left atrium: The atrium was mildly dilated   04/03/2017 Tumor Marker   Patient's tumor was tested for the following markers: CA125 Results of the tumor marker test revealed 47.2   05/14/2017 Imaging   CT scan of abdomen and pelvis Stable mild peritoneal thickening. No new or progressive disease within the abdomen or pelvis.  Stable hepatic cirrhosis. Stable splenomegaly, consistent with portal venous hypertension. No evidence of hepatic neoplasm.   05/15/2017 Tumor Marker   Patient's tumor was tested for the following markers: CA125 Results of the tumor marker test revealed 56.1   06/12/2017 Tumor Marker   Patient's tumor was tested for the following markers: CA125 Results of the tumor marker test revealed 49.2   07/10/2017 Tumor Marker   Patient's tumor was tested for the following markers: CA125 Results of the tumor marker test revealed 46.3   08/07/2017 Tumor Marker   Patient's tumor was tested for the following markers: CA125 Results of the tumor marker test revealed 52.9   08/20/2017 Imaging   1. Mild omental/peritoneal haziness, unchanged. No evidence of new metastatic disease. 2. Cirrhosis with splenomegaly.   09/04/2017 Tumor Marker   Patient's tumor was tested for the following markers: CA125 Results of the tumor marker test revealed 48.5   11/13/2017 Tumor Marker   Patient's tumor was tested for the following markers: CA125 Results of the tumor marker test revealed 55.8   12/17/2017 Imaging    1. No findings to suggest metastatic disease in the abdomen or pelvis. 2. Severe hepatic cirrhosis with evidence of portal hypertension, as demonstrated by dilated portal vein, splenomegaly and portosystemic collateral pathways, including gastric and esophageal varices.  3. Colonic diverticulosis without evidence of acute diverticulitis at this time. 4. Additional incidental findings, as above.   01/16/2018 Tumor Marker   Patient's tumor was tested for the following markers: CA125 Results of the tumor marker test revealed 63.7   02/10/2018 Procedure   EGD - Recently bleeding grade III and large (> 5 mm) esophageal varices. Completely eradicated. Banded. - Portal hypertensive gastropathy. - Normal examined duodenum. - No specimens collected   02/13/2018 Tumor Marker   Patient's tumor was tested for the following markers: CA125 Results of the tumor marker test revealed 82.7   02/25/2018 Imaging   Bone density showed mild osteopenia   04/08/2018 Tumor Marker   Patient's tumor was tested for the following markers: CA125 Results of the tumor marker test revealed 82.7   04/08/2018 Imaging   1. No definite omental  or peritoneal surface lesions. However, there are 2 slowly enlarging lymph nodes noted. One is in the small bowel mesentery and the other is in the left deep pelvis. Could not exclude recurrent disease. 2. Stable advanced cirrhotic changes involving the liver with portal venous hypertension, portal venous collaterals, esophageal varices and splenomegaly. No worrisome hepatic lesions. 3. Diffuse wall thickening of the colon could suggest diffuse colitis or could be due to low albumin. Recommend correlation with any symptoms such as diarrhea.   05/21/2018 Tumor Marker   Patient's tumor was tested for the following markers: CA125 Results of the tumor marker test revealed 86   09/07/2018 Tumor Marker   Patient's tumor was tested for the following markers:CA-125 Results of the tumor  marker test revealed 111   09/29/2018 Tumor Marker   Patient's tumor was tested for the following markers: CA-125 Results of the tumor marker test revealed 121.   09/29/2018 - 11/24/2018 Chemotherapy   The patient had carboplatin and gemzar for chemotherapy treatment.  Chemo was stopped due to allergic reaction to carboplatin   10/27/2018 Tumor Marker   Patient's tumor was tested for the following markers: CA-125 Results of the tumor marker test revealed 99.9   11/24/2018 Tumor Marker   Patient's tumor was tested for the following markers: CA-125 Results of the tumor marker test revealed 81.2   12/14/2018 Tumor Marker   Patient's tumor was tested for the following markers: CA-125 Results of the tumor marker test revealed 81.2   12/14/2018 Imaging   1. Mild improvement in peritoneal disease. Peritoneal nodule within the left posterior pelvis contiguous with the left side of vaginal cuff is slightly decreased in size in the interval. Within the right iliac fossa there is a small peritoneal nodule which has decreased in size in the interval. Central small bowel mesenteric lymph node is not significantly changed. Slight decrease in size left periaortic lymph node.    2. Morphologic features of the liver compatible with cirrhosis. Splenomegaly is noted which may reflect portal venous hypertension.   12/21/2018 Tumor Marker   Patient's tumor was tested for the following markers: CA-125 Results of the tumor marker test revealed 64.2   12/21/2018 -  Chemotherapy   The patient had taxotere for chemotherapy treatment.     03/02/2019 Tumor Marker   Patient's tumor was tested for the following markers: CA-125. 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   No new or progressive metastatic disease within the abdomen or pelvis.   Hepatic cirrhosis and findings of portal venous hypertension. 1.1 cm  low-attenuation lesion within left hepatic lobe, which could represent a regenerative nodule or small hepatocellular carcinoma. Recommend abdomen MRI without and with contrast for further characterization.     04/13/2019 Tumor Marker   Patient's tumor was tested for the following markers: CA-125 Results of the tumor marker test revealed 36.8   05/26/2019 Tumor Marker   Patient's tumor was tested for the following markers: CA-125 Results of the tumor marker test revealed 31.6.   06/15/2019 Tumor Marker   Patient's tumor was tested for the following markers: CA-125 Results of the tumor marker test revealed 25.4   07/05/2019 Imaging   1. Stable CT examination. Stable peritoneal thickening in the pelvis and paracolic gutters. Stable solid left deep pelvic implant. Stable trace ascites. No new or progressive metastatic disease. 2. Stable cirrhosis. No discrete liver masses. 3. Stable mild-to-moderate splenomegaly. 4. Aortic  Atherosclerosis (ICD10-I70.0).   07/27/2019 Tumor Marker   Patient's tumor was tested for the following markers: CA-125 Results of the tumor marker test revealed 35.4   08/17/2019 Tumor Marker   Patient's tumor was tested for the following markers: Ca-125 Results of the tumor marker test revealed 35.7.   09/17/2019 - 09/21/2019 Hospital Admission   She was admitted due to esophageal variceal bleeding presents with hematemesis and melanotic stool with epigastric pain.  GI consulted, started on octreotide infusion.  Underwent EGD which showed minimal bleeding, remain on octreotide drip, PPI drip.  Hemoglobin trended down therefore received PRBC transfusion.  Over several days her bleeding subsided, hemodynamically remained stable.    09/17/2019 Procedure   EGD report - Esophageal mucosal scarring, from prior esophageal banding sessions. - Grade I esophageal varices. - Red blood in the cardia and in the gastric fundus. - Portal hypertensive gastropathy. - Normal duodenal bulb,  first portion of the duodenum and second portion of the duodenum. - No obvious source of bleeding identified; no ongoing/active bleeding seen during our exam; possible explanations for bleeding include: variceal bleeding (gastric or esophageal) that had resolved and varices flattened with medical therapy versus dieulafoy lesion versus portal gastropathy bleeding versus other     REVIEW OF SYSTEMS:   Constitutional: Denies fevers, chills or abnormal weight loss Eyes: Denies blurriness of vision Ears, nose, mouth, throat, and face: Denies mucositis or sore throat Respiratory: Denies cough, dyspnea or wheezes Cardiovascular: Denies palpitation, chest discomfort or lower extremity swelling Gastrointestinal:  Denies nausea, heartburn or change in bowel habits Skin: Denies abnormal skin rashes Behavioral/Psych: Mood is stable, no new changes  All other systems were reviewed with the patient and are negative.  I have reviewed the past medical history, past surgical history, social history and family history with the patient and they are unchanged from previous note.  ALLERGIES:  is allergic to carboplatin, shellfish allergy, vancomycin, benadryl [diphenhydramine hcl], and penicillins.  MEDICATIONS:  Current Outpatient Medications  Medication Sig Dispense Refill  . cholecalciferol (VITAMIN D) 1000 units tablet Take 1,000 Units by mouth at bedtime.     Marland Kitchen dexamethasone (DECADRON) 4 MG tablet Start the day before Taxotere, take 1 tablet in the morning, and then 1 tablet daily after chemo for 2 days. Pls dispense 30 tabs (Patient taking differently: Take 4 mg by mouth See admin instructions. Start the day before Taxotere, take 1 tablet in the morning, and then 1 tablet daily after chemo for 2 days. Pls dispense 30 tabs) 30 tablet 1  . gabapentin (NEURONTIN) 300 MG capsule Take 3 capsules (900 mg total) by mouth 2 (two) times daily. 120 capsule 11  . HUMALOG MIX 75/25 KWIKPEN (75-25) 100 UNIT/ML Kwikpen  Inject 22 Units into the skin 2 (two) times daily. Before breakfast & before supper Except when taking Chemo take 32 units  For three to five days    . HYDROcodone-acetaminophen (NORCO/VICODIN) 5-325 MG tablet Take 1 tablet by mouth every 6 (six) hours as needed for moderate pain. 60 tablet 0  . lidocaine-prilocaine (EMLA) cream Apply to Porta-cath  1-2 hours prior to access as directed. (Patient taking differently: Apply 1 application topically daily as needed (port access). ) 30 g 9  . nadolol (CORGARD) 20 MG tablet Take 1 tablet (20 mg total) by mouth daily. (Patient taking differently: Take 20 mg by mouth at bedtime. ) 90 tablet 3  . NEUPRO 4 MG/24HR APPLY 1 PATCH EXTERNALLY TO THE SKIN DAILY 30 patch 0  .  ondansetron (ZOFRAN) 8 MG tablet Take 1 tablet (8 mg total) by mouth every 8 (eight) hours as needed. Start on the third day after chemotherapy. (Patient taking differently: Take 8 mg by mouth every 8 (eight) hours as needed for nausea. Start on the third day after chemotherapy.) 30 tablet 1  . pantoprazole (PROTONIX) 40 MG tablet Take 1 tablet (40 mg total) by mouth daily.    Marland Kitchen PROAIR HFA 108 (90 Base) MCG/ACT inhaler Inhale 2 puffs into the lungs every 4 (four) hours as needed for wheezing or shortness of breath.     . prochlorperazine (COMPAZINE) 10 MG tablet Take 1 tablet (10 mg total) by mouth every 6 (six) hours as needed (Nausea or vomiting). 30 tablet 1   No current facility-administered medications for this visit.    PHYSICAL EXAMINATION: ECOG PERFORMANCE STATUS: 2 - Symptomatic, <50% confined to bed  Vitals:   09/28/19 0818  BP: (!) 143/61  Pulse: 69  Resp: 18  Temp: 97.9 F (36.6 C)  SpO2: 100%   Filed Weights   09/28/19 0818  Weight: 201 lb 12.8 oz (91.5 kg)    GENERAL:alert, no distress and comfortable SKIN: Noted extensive bruises HEART: Noted extensive bilateral lower extremity edema  NEURO: alert & oriented x 3 with fluent speech, no focal motor/sensory  deficits  LABORATORY DATA:  I have reviewed the data as listed    Component Value Date/Time   NA 143 09/28/2019 0717   NA 142 02/06/2017 0742   K 4.0 09/28/2019 0717   K 3.9 02/06/2017 0742   CL 111 09/28/2019 0717   CO2 23 09/28/2019 0717   CO2 22 02/06/2017 0742   GLUCOSE 137 (H) 09/28/2019 0717   GLUCOSE 156 (H) 02/06/2017 0742   BUN 16 09/28/2019 0717   BUN 13.5 02/06/2017 0742   CREATININE 0.94 09/28/2019 0717   CREATININE 0.8 02/06/2017 0742   CALCIUM 8.7 (L) 09/28/2019 0717   CALCIUM 8.5 02/06/2017 0742   PROT 5.9 (L) 09/28/2019 0717   PROT 6.9 02/06/2017 0742   ALBUMIN 3.3 (L) 09/28/2019 0717   ALBUMIN 3.7 02/06/2017 0742   AST 31 09/28/2019 0717   AST 47 (H) 02/06/2017 0742   ALT 46 (H) 09/28/2019 0717   ALT 54 02/06/2017 0742   ALKPHOS 99 09/28/2019 0717   ALKPHOS 130 02/06/2017 0742   BILITOT 0.7 09/28/2019 0717   BILITOT 0.65 02/06/2017 0742   GFRNONAA >60 09/28/2019 0717   GFRAA >60 09/28/2019 0717    No results found for: SPEP, UPEP  Lab Results  Component Value Date   WBC 4.7 09/28/2019   NEUTROABS 3.6 09/28/2019   HGB 8.9 (L) 09/28/2019   HCT 28.7 (L) 09/28/2019   MCV 97.3 09/28/2019   PLT 85 (L) 09/28/2019      Chemistry      Component Value Date/Time   NA 143 09/28/2019 0717   NA 142 02/06/2017 0742   K 4.0 09/28/2019 0717   K 3.9 02/06/2017 0742   CL 111 09/28/2019 0717   CO2 23 09/28/2019 0717   CO2 22 02/06/2017 0742   BUN 16 09/28/2019 0717   BUN 13.5 02/06/2017 0742   CREATININE 0.94 09/28/2019 0717   CREATININE 0.8 02/06/2017 0742      Component Value Date/Time   CALCIUM 8.7 (L) 09/28/2019 0717   CALCIUM 8.5 02/06/2017 0742   ALKPHOS 99 09/28/2019 0717   ALKPHOS 130 02/06/2017 0742   AST 31 09/28/2019 0717   AST 47 (H) 02/06/2017 0539  ALT 46 (H) 09/28/2019 0717   ALT 54 02/06/2017 0742   BILITOT 0.7 09/28/2019 0717   BILITOT 0.65 02/06/2017 0742

## 2019-09-28 NOTE — Telephone Encounter (Signed)
Scheduled appts per 8/16 los. Pt declined print out of AVS and stated she would refer to mychart.

## 2019-09-28 NOTE — Assessment & Plan Note (Signed)
She is taking chronic hydrocodone for chronic back pain I refill her prescription today We discussed narcotic refill policy

## 2019-10-09 ENCOUNTER — Other Ambulatory Visit: Payer: Self-pay | Admitting: Hematology and Oncology

## 2019-10-21 NOTE — Progress Notes (Signed)
Assessment/Plan:   1.  Restless leg secondary to iron deficiency anemia  -Continue rotigotine patch, 4 mg daily.  -Last ferritin was a little bit low, 53.  This overall is not necessarily low ferritin, but ferritin levels at this level can contribute to restless leg.  She did recently have a transfusion after acute GI bleed.  Feels that RLS is a lot worse but has appt tomorrow will Dr Alvy Bimler.  Will send message. 2.  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  Pancytopenia  -Chemotherapy-induced. 4.  History of small subdural hematoma in November, 2020 after a fall  -Nothing further to do.  Has seen Dr. Saintclair Halsted. Subjective:   Olivia Benson was seen today in follow up for RLS.  My previous records as well as any outside records available were reviewed prior to todays visit.  Pt is currently on neupro, 4 mg.  Pt denies falls.  Pt was recently in the hospital and discharged on August 10.  Those records are reviewed.  Patient had GI bleeding.  She had an EGD on August 6 that showed minimal bleeding.  She did require transfusion.  "I am eating ice like crazy."  In addition to that stress, her mother died this 04/29/2022.  appt with Dr. Alvy Bimler is tomorrow AM.     PREVIOUS MEDICATIONS: Neupro, 68m  CURRENT MEDICATIONS:  Outpatient Encounter Medications as of 10/25/2019  Medication Sig  . cholecalciferol (VITAMIN D) 1000 units tablet Take 1,000 Units by mouth at bedtime.   .Marland Kitchendexamethasone (DECADRON) 4 MG tablet Start the day before Taxotere, take 1 tablet in the morning, and then 1 tablet daily after chemo for 2 days. Pls dispense 30 tabs (Patient taking differently: Take 4 mg by mouth See admin instructions. Start the day before Taxotere, take 1 tablet in the morning, and then 1 tablet daily after chemo for 2 days. Pls dispense 30 tabs)  . gabapentin (NEURONTIN) 300 MG capsule TAKE 3 CAPSULES(900 MG) BY MOUTH TWICE DAILY  . HUMALOG MIX 75/25 KWIKPEN (75-25) 100 UNIT/ML Kwikpen  Inject 22 Units into the skin 2 (two) times daily. Before breakfast & before supper Except when taking Chemo take 32 units  For three to five days  . HYDROcodone-acetaminophen (NORCO/VICODIN) 5-325 MG tablet Take 1 tablet by mouth every 6 (six) hours as needed for moderate pain.  .Marland Kitchenlidocaine-prilocaine (EMLA) cream Apply to Porta-cath  1-2 hours prior to access as directed. (Patient taking differently: Apply 1 application topically daily as needed (port access). )  . nadolol (CORGARD) 20 MG tablet Take 1 tablet (20 mg total) by mouth daily. (Patient taking differently: Take 20 mg by mouth at bedtime. )  . ondansetron (ZOFRAN) 8 MG tablet Take 1 tablet (8 mg total) by mouth every 8 (eight) hours as needed. Start on the third day after chemotherapy. (Patient taking differently: Take 8 mg by mouth every 8 (eight) hours as needed for nausea. Start on the third day after chemotherapy.)  . pantoprazole (PROTONIX) 40 MG tablet Take 1 tablet (40 mg total) by mouth daily.  .Marland KitchenPROAIR HFA 108 (90 Base) MCG/ACT inhaler Inhale 2 puffs into the lungs every 4 (four) hours as needed for wheezing or shortness of breath.   . prochlorperazine (COMPAZINE) 10 MG tablet Take 1 tablet (10 mg total) by mouth every 6 (six) hours as needed (Nausea or vomiting).  . rotigotine (NEUPRO) 4 MG/24HR APPLY 1 PATCH EXTERNALLY TO THE SKIN DAILY  . [DISCONTINUED] NEUPRO 4 MG/24HR  APPLY 1 PATCH EXTERNALLY TO THE SKIN DAILY   No facility-administered encounter medications on file as of 10/25/2019.     Objective:   PHYSICAL EXAMINATION:    VITALS:   Vitals:   10/25/19 0834  BP: (!) 146/84  Pulse: 80  SpO2: 99%  Weight: 195 lb (88.5 kg)  Height: 5' 1.5" (1.562 m)    GEN:  The patient appears stated age and is in NAD. HEENT:  Normocephalic, atraumatic.  The mucous membranes are moist. The superficial temporal arteries are without ropiness or tenderness. CV:  RRR Lungs:  CTAB Neck/HEME:  There are no carotid bruits  bilaterally.  Neurological examination:  Orientation: The patient is alert and oriented x3. Cranial nerves: There is good facial symmetry.The speech is fluent and clear. Soft palate rises symmetrically and there is no tongue deviation. Hearing is intact to conversational tone. Sensation: Sensation is intact to light touch throughout Motor: Strength is at least antigravity x4.  Movement examination: Tone: There is normal tone in the UE/LE Abnormal movements:  no tremor.  No myoclonus.  No asterixis.   Coordination:  There is no decremation with RAM's. Gait and Station: The patient has no difficulty arising out of a deep-seated chair without the use of the hands. The patient's stride length is good but she is slightly off balance  I have reviewed and interpreted the following labs independently   Chemistry      Component Value Date/Time   NA 143 09/28/2019 0717   NA 142 02/06/2017 0742   K 4.0 09/28/2019 0717   K 3.9 02/06/2017 0742   CL 111 09/28/2019 0717   CO2 23 09/28/2019 0717   CO2 22 02/06/2017 0742   BUN 16 09/28/2019 0717   BUN 13.5 02/06/2017 0742   CREATININE 0.94 09/28/2019 0717   CREATININE 0.8 02/06/2017 0742      Component Value Date/Time   CALCIUM 8.7 (L) 09/28/2019 0717   CALCIUM 8.5 02/06/2017 0742   ALKPHOS 99 09/28/2019 0717   ALKPHOS 130 02/06/2017 0742   AST 31 09/28/2019 0717   AST 47 (H) 02/06/2017 0742   ALT 46 (H) 09/28/2019 0717   ALT 54 02/06/2017 0742   BILITOT 0.7 09/28/2019 0717   BILITOT 0.65 02/06/2017 0742     Lab Results  Component Value Date   WBC 4.7 09/28/2019   HGB 8.9 (L) 09/28/2019   HCT 28.7 (L) 09/28/2019   MCV 97.3 09/28/2019   PLT 85 (L) 09/28/2019   Lab Results  Component Value Date   FERRITIN 53 09/28/2019     Total time spent on today's visit was 25 minutes, including both face-to-face time and nonface-to-face time.  Time included that spent on review of records (prior notes available to me/labs/imaging if  pertinent), discussing treatment and goals, answering patient's questions and coordinating care.  Cc:  Gregor Hams, FNP

## 2019-10-24 ENCOUNTER — Other Ambulatory Visit: Payer: Self-pay | Admitting: Neurology

## 2019-10-25 ENCOUNTER — Other Ambulatory Visit: Payer: Self-pay

## 2019-10-25 ENCOUNTER — Encounter: Payer: Self-pay | Admitting: Neurology

## 2019-10-25 ENCOUNTER — Other Ambulatory Visit: Payer: Self-pay | Admitting: Neurology

## 2019-10-25 ENCOUNTER — Ambulatory Visit (INDEPENDENT_AMBULATORY_CARE_PROVIDER_SITE_OTHER): Payer: Medicare Other | Admitting: Neurology

## 2019-10-25 VITALS — BP 146/84 | HR 80 | Ht 61.5 in | Wt 195.0 lb

## 2019-10-25 DIAGNOSIS — G2581 Restless legs syndrome: Secondary | ICD-10-CM | POA: Diagnosis not present

## 2019-10-25 DIAGNOSIS — D509 Iron deficiency anemia, unspecified: Secondary | ICD-10-CM | POA: Diagnosis not present

## 2019-10-25 MED ORDER — NEUPRO 4 MG/24HR TD PT24: MEDICATED_PATCH | TRANSDERMAL | 11 refills | Status: DC

## 2019-10-25 NOTE — Patient Instructions (Signed)
Good to see you!  The physicians and staff at [facility] are committed to providing excellent care. You may receive a survey requesting feedback about your experience at our office. We strive to receive "very good" responses to the survey questions. If you feel that your experience would prevent you from giving the office a "very good " response, please contact our office to try to remedy the situation. We may be reached at [phone]. Thank you for taking the time out of your busy day to complete the survey.

## 2019-10-26 ENCOUNTER — Inpatient Hospital Stay: Payer: Medicare Other | Attending: Hematology and Oncology | Admitting: Hematology and Oncology

## 2019-10-26 ENCOUNTER — Encounter: Payer: Self-pay | Admitting: Hematology and Oncology

## 2019-10-26 ENCOUNTER — Other Ambulatory Visit: Payer: Self-pay

## 2019-10-26 ENCOUNTER — Telehealth: Payer: Self-pay | Admitting: Hematology and Oncology

## 2019-10-26 ENCOUNTER — Inpatient Hospital Stay: Payer: Medicare Other

## 2019-10-26 VITALS — HR 3 | Temp 97.8°F | Ht 61.5 in | Wt 193.8 lb

## 2019-10-26 DIAGNOSIS — Z7189 Other specified counseling: Secondary | ICD-10-CM

## 2019-10-26 DIAGNOSIS — Z9221 Personal history of antineoplastic chemotherapy: Secondary | ICD-10-CM | POA: Diagnosis not present

## 2019-10-26 DIAGNOSIS — C482 Malignant neoplasm of peritoneum, unspecified: Secondary | ICD-10-CM | POA: Diagnosis not present

## 2019-10-26 DIAGNOSIS — T451X5A Adverse effect of antineoplastic and immunosuppressive drugs, initial encounter: Secondary | ICD-10-CM | POA: Diagnosis not present

## 2019-10-26 DIAGNOSIS — D509 Iron deficiency anemia, unspecified: Secondary | ICD-10-CM

## 2019-10-26 DIAGNOSIS — E1140 Type 2 diabetes mellitus with diabetic neuropathy, unspecified: Secondary | ICD-10-CM | POA: Diagnosis not present

## 2019-10-26 DIAGNOSIS — D61818 Other pancytopenia: Secondary | ICD-10-CM

## 2019-10-26 DIAGNOSIS — G620 Drug-induced polyneuropathy: Secondary | ICD-10-CM | POA: Diagnosis not present

## 2019-10-26 DIAGNOSIS — Z79899 Other long term (current) drug therapy: Secondary | ICD-10-CM | POA: Insufficient documentation

## 2019-10-26 DIAGNOSIS — Z5111 Encounter for antineoplastic chemotherapy: Secondary | ICD-10-CM | POA: Insufficient documentation

## 2019-10-26 DIAGNOSIS — K55059 Acute (reversible) ischemia of intestine, part and extent unspecified: Secondary | ICD-10-CM | POA: Diagnosis not present

## 2019-10-26 DIAGNOSIS — Z794 Long term (current) use of insulin: Secondary | ICD-10-CM | POA: Diagnosis not present

## 2019-10-26 LAB — CMP (CANCER CENTER ONLY)
ALT: 17 U/L (ref 0–44)
AST: 24 U/L (ref 15–41)
Albumin: 3.4 g/dL — ABNORMAL LOW (ref 3.5–5.0)
Alkaline Phosphatase: 111 U/L (ref 38–126)
Anion gap: 9 (ref 5–15)
BUN: 15 mg/dL (ref 8–23)
CO2: 22 mmol/L (ref 22–32)
Calcium: 8.5 mg/dL — ABNORMAL LOW (ref 8.9–10.3)
Chloride: 108 mmol/L (ref 98–111)
Creatinine: 0.85 mg/dL (ref 0.44–1.00)
GFR, Est AFR Am: >60 mL/min (ref >60)
GFR, Estimated: >60 mL/min (ref >60)
Glucose, Bld: 171 mg/dL — ABNORMAL HIGH (ref 70–99)
Potassium: 4.4 mmol/L (ref 3.5–5.1)
Sodium: 139 mmol/L (ref 135–145)
Total Bilirubin: 0.7 mg/dL (ref 0.3–1.2)
Total Protein: 6.7 g/dL (ref 6.5–8.1)

## 2019-10-26 LAB — CBC WITH DIFFERENTIAL (CANCER CENTER ONLY)
Abs Immature Granulocytes: 0.01 10*3/uL (ref 0.00–0.07)
Basophils Absolute: 0 10*3/uL (ref 0.0–0.1)
Basophils Relative: 0 %
Eosinophils Absolute: 0.1 10*3/uL (ref 0.0–0.5)
Eosinophils Relative: 4 %
HCT: 32.7 % — ABNORMAL LOW (ref 36.0–46.0)
Hemoglobin: 9.9 g/dL — ABNORMAL LOW (ref 12.0–15.0)
Immature Granulocytes: 0 %
Lymphocytes Relative: 21 %
Lymphs Abs: 0.7 10*3/uL (ref 0.7–4.0)
MCH: 28 pg (ref 26.0–34.0)
MCHC: 30.3 g/dL (ref 30.0–36.0)
MCV: 92.4 fL (ref 80.0–100.0)
Monocytes Absolute: 0.2 10*3/uL (ref 0.1–1.0)
Monocytes Relative: 7 %
Neutro Abs: 2.1 10*3/uL (ref 1.7–7.7)
Neutrophils Relative %: 68 %
Platelet Count: 95 10*3/uL — ABNORMAL LOW (ref 150–400)
RBC: 3.54 MIL/uL — ABNORMAL LOW (ref 3.87–5.11)
RDW: 15 % (ref 11.5–15.5)
WBC Count: 3.1 10*3/uL — ABNORMAL LOW (ref 4.0–10.5)
nRBC: 0 % (ref 0.0–0.2)

## 2019-10-26 MED ORDER — HYDROCODONE-ACETAMINOPHEN 5-325 MG PO TABS
1.0000 | ORAL_TABLET | Freq: Four times a day (QID) | ORAL | 0 refills | Status: DC | PRN
Start: 2019-10-26 — End: 2019-12-02

## 2019-10-26 NOTE — Assessment & Plan Note (Signed)
She has multifactorial anemia, with component of iron deficiency I plan to schedule intravenous iron infusion next week

## 2019-10-26 NOTE — Assessment & Plan Note (Signed)
She is recovering well from recent medical issues Her pancytopenia is improved I recommend CT imaging next week for objective assessment of response to therapy If CT scan shows stability, we will resume chemotherapy next week

## 2019-10-26 NOTE — Assessment & Plan Note (Signed)
This is multifactorial, 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, recent bleeding and chemotherapy Observe only for now Plan for IV iron next week No need to treat thrombocytopenia

## 2019-10-26 NOTE — Telephone Encounter (Signed)
Scheduled appointments per 9/14 scheduling message. Patient is aware of appointments and I printed her out an updated calendar.

## 2019-10-26 NOTE — Progress Notes (Signed)
[place] OFFICE PROGRESS NOTE  Patient Care Team: Gregor Hams, FNP as PCP - General (Family Medicine) Tat, Eustace Quail, DO as Consulting Physician (Neurology)  ASSESSMENT & PLAN:  Primary peritoneal carcinomatosis [facility]) She is recovering well from recent medical issues Her pancytopenia is improved I recommend CT imaging next week for objective assessment of response to therapy If CT scan shows stability, we will resume chemotherapy next week  Iron deficiency anemia She has multifactorial anemia, with component of iron deficiency I plan to schedule intravenous iron infusion next week  Pancytopenia, acquired ([place]) This is multifactorial, 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 Comprehensive metabolic panel    Standing Status:   Standing    Number of Occurrences:   33    Standing Expiration Date:   10/25/2020  . Iron and TIBC    Standing Status:   Standing    Number of Occurrences:   2    Standing Expiration Date:   10/25/2020  . Ferritin    Standing Status:   Standing    Number of Occurrences:   2    Standing Expiration Date:   10/25/2020    All questions were answered. The patient knows to call the clinic with any problems, questions or concerns. The total time spent in the appointment was 20 minutes encounter with patients including review of chart and various tests results, discussions about plan of care and coordination of care plan   [name], MD 10/26/2019 9:31 AM  INTERVAL HISTORY: Please see below for problem oriented charting. She returns with her husband for further follow-up The patient denies any recent signs or symptoms of bleeding such as spontaneous epistaxis, hematuria or hematochezia. 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 Had allergic reaction to carboplatin. Avastin was stopped due to GI hemorrhage   Primary peritoneal carcinomatosis ([place])  09/06/2011 Imaging   CT findings consistent with cirrhotic changes involving the liver.  No worrisome liver mass.  There are associated portal venous collaterals and splenomegaly consistent with portal venous hypertension. 2.  No other significant upper abdominal findings   11/08/2013 Imaging   US showed ascites   11/08/2013 Initial Diagnosis   Patient had ~ 2 months of some urinary incontinence, saw Dr Ronita Hipps in 10-2013, with pelvic mass on exam    11/12/2013 Imaging   There are findings of progressive hepatic cirrhosis with a large amount of ascites and mild splenomegaly. There are venous collaterals present. 2. There is abnormal thickening of the peritoneal surface along the left mid and lower abdominal wall. There is abnormal soft tissue density in the pelvis as well which could reflect the clinically suspected ovarian malignancy but the findings are nondiagnostic. 3. There is a new small left pleural effusion. 4. There is no acute bowel abnormality.   11/18/2013 Imaging   Successful ultrasound guided diagnostic and therapeutic paracentesis yielding 4.1 liters of ascites.    11/18/2013 Pathology Results   PERITONEAL/ASCITIC FLUID (SPECIMEN 1 OF 1 COLLECTED 11/18/13): SEROUS CARCINOMA, PLEASE SEE COMMENT.   12/01/2013 Tumor Marker   Patient's tumor was tested for the following markers: CA125  Results of the tumor marker test revealed 1938   12/03/2013 Imaging   Successful ultrasound-guided therapeutic paracentesis yielding 3.6 liters of peritoneal fluid.   12/07/2013 - 05/30/2014 Chemotherapy   She received 3 cycles of carboplatin and Taxol, interrupted cycle 4 for surgery.  She  subsequently completed 3 more cycles of chemotherapy after surgery   12/20/2013 Imaging   Successful ultrasound-guided diagnostic and therapeutic paracentesis yielding 2.2 liters of peritoneal fluid   12/20/2013 Pathology Results   PERITONEAL/ASCITIC FLUID(SPECIMEN 1 OF 1 COLLECTED 12/20/13): MALIGNANT CELLS CONSISTENT WITH SEROUS CARCINOMA   01/13/2014 Tumor Marker   Patient's tumor was tested for the following markers: CA125 Results of the tumor marker test revealed 227   02/07/2014 Tumor Marker   Patient's tumor was tested for the following markers: CA125 Results of the tumor marker test revealed 130   02/15/2014 Genetic Testing   Genetics testing from 02-2014 normal ([employer] panel)   02/23/2014 Imaging   Interval decrease in ascites. 2. Morphologic changes in the liver consistent with cirrhosis. 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 Portal vein remains patent at this time. 3. Persistent but decreased abnormal soft tissue attenuation tracking in the omental fat. This may be secondary to interval improvement in metastatic disease. 4. Peritoneal thickening seen in the left abdomen and pelvis on the previous study has decreased and nearly resolved in the interval. This also suggests interval improvement in metastatic disease.    03/07/2014 Procedure   Ultrasound and fluoroscopically guided right internal jugular single lumen power port catheter insertion. Tip in the SVC/RA junction. Catheter ready for use.   03/17/2014 Tumor Marker   Patient's tumor was tested for the following markers: CA125 Results of the tumor marker test revealed 45   03/29/2014 Pathology Results   1. Omentum, resection for tumor - HIGH GRADE SEROUS CARCINOMA, SEE COMMENT. 2. Ovary and fallopian tube, right - HIGH GRADE SEROUS CARCINOMA, SEE COMMENT. 3. Ovary and fallopian tube, left - HIGH GRADE SEROUS CARCINOMA, SEE COMMENT. Diagnosis Note 1. Nests and clusters of malignant  cells are invading the omental tissue (part #1) with associated fibrosis. The cells are pleomorphic with prominent nucleoli. There are scattered psammoma bodies. The ovaries are atrophic and exhibit multiple foci of surface based invasive carcinoma and associated fibrosis. The fallopian tubes have a few foci of carcinoma, also superficially located. There are no precursor lesions noted in the ovaries or fallopian tubes (the entire tubes and ovaries were submitted for evaluation). While there is some retraction artifact, there are several foci suspicious for lymphovascular invasion. 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 Given the fibrosis, there does appear to be a small amount of treatment effect, however, there is abundant residual tumor.    03/29/2014 Surgery   Procedure(s) Performed: 1. Exploratory laparotomy with bilateral salpingo-oophorectomy, omentectomy radical tumor debulking for ovarian cancer .  Surgeon: Thereasa Solo, MD.  Assistant Surgeon: Lahoma Crocker, M.D. Assistant: (an MD assistant was necessary for tissue manipulation, retraction and positioning due to the complexity of the case and hospital policies).  Operative Findings: 10cm omental cake from hepatic to splenic flexure, densely adherent to transverse colon. Milliary studding of tumor implants (<70m, too numerous in number to count) adherent to the mesentery of the small bowel and small bowel wall. Small volume (100cc) ascites. Small ovaries bilaterally, densely adherent to the pelvic cul de sac. Left ovary  and tube densely adherent to sigmoid colon. Cirrhotic liver with hepatomegaly and splenomegaly.     This represented an optimal cytoreduction (R1) with visible disease residual on the bowel wall and mesentery (millial, <69m implants).     04/18/2014 Tumor Marker   Patient's tumor was  tested for the following markers: CA125 Results of the tumor marker test revealed 122   05/16/2014 Tumor Marker   Patient's tumor was tested for the following markers: CA125 Results of the tumor marker test revealed 30   06/10/2014 Tumor Marker   Patient's tumor was tested for the following markers: CA125 Results of the tumor marker test revealed 19   07/04/2014 Imaging   Interval improvement in the appearance of peritoneal metastasis secondary to ovarian cancer. 2. 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    08/18/2014 Tumor Marker   Patient's tumor was tested for the following markers: CA125 Results of the tumor marker test revealed 16   10/13/2014 Tumor Marker   Patient's tumor was tested for the following markers: CA125 Results of the tumor marker test revealed 14   11/21/2014 Tumor Marker   Patient's tumor was tested for the following markers: CA125 Results of the tumor marker test revealed 16   12/28/2014 Tumor Marker   Patient's tumor was tested for the following markers: CA125 Results of the tumor marker test revealed 762   02/27/2015 Tumor Marker   Patient's tumor was tested for the following markers: CA125 Results of the tumor marker test revealed 20.2   03/22/2015 Imaging   Filler appearance to the prior exam, with very subtle fluid tracking along portions of the liver, and very minimal nodularity along the right paracolic gutter representing residua from the prior peritoneal cancer. The current abnormalities could simply be post therapy findings rather than necessarily representing residual malignancy. No new or enlarging lesions are identified. 2. Hepatic cirrhosis and splenomegaly. There is some gastric varices suggesting portal venous hypertension. 3. Left foraminal stenosis at L4-5 due to spurring. There is likely also some central narrowing of the thecal sac at this level. 4. Bibasilar scarring. 5. Chronic mild left mid kidney scarring   03/27/2015  Tumor Marker   Patient's tumor was tested for the following markers: CA125 Results of the tumor marker test revealed 25.3   04/24/2015 Imaging   Disc bulge L2-3 with mild spinal stenosis and right foraminal encroachment. 2. Disc bulge L3-4 with mild spinal stenosis. 3. Multifactorial spinal, left lateral recess and foraminal stenosis L4-5. There is grade 1 anterolisthesis without evident dynamic instability.   05/29/2015 Tumor Marker   Patient's tumor was tested for the following markers: CA125 Results of the tumor marker test revealed 29.9   08/21/2015 Tumor Marker   Patient's tumor was tested for the following markers: CA125 Results of the tumor marker test revealed 45   09/18/2015 Tumor Marker   Patient's tumor was tested for the following markers: CA125 Results of the tumor marker test revealed 47   09/27/2015 Imaging   New small bowel mesenteric nodule and enlarged left external iliac lymph node, highly worrisome for metastatic disease. 2. Cirrhosis and splenomegaly   10/10/2015 Tumor Marker   Patient's tumor was tested for the following markers: CA125 Results of the tumor marker test revealed 53.4   10/10/2015 - 12/11/2015 Chemotherapy   She received 4 cycles of carboplatin only   11/20/2015 Tumor Marker   Patient's tumor was tested for the following markers: CA125 Results of the tumor marker test revealed 41.3  12/20/2015 Imaging   CT: Mild left external iliac lymphadenopathy is slightly decreased. Mildly enlarged lower mesenteric nodule is slightly decreased. 2. No new or progressive metastatic disease in the abdomen or pelvis. 3. Cirrhosis. Stable subcentimeter hypodense left liver lobe lesion. No new liver lesions. 4. Stable mild splenomegaly.  No ascites. 5. Mild sigmoid diverticulosis.   01/01/2016 -  Anti-estrogen oral therapy   She was placed on tamoxifen. Had letrozole initially but switched to tamoxifen due to poor tolerance   01/15/2016 Tumor Marker   Patient's tumor  was tested for the following markers: CA125 Results of the tumor marker test revealed 31.4   02/19/2016 Tumor Marker   Patient's tumor was tested for the following markers: CA125 Results of the tumor marker test revealed 36.5   05/13/2016 Imaging   No evidence of disease progression within the abdomen or pelvis. Previously noted small central mesenteric nodule and left external iliac node appear slightly smaller. 2. Stable changes of hepatic cirrhosis and portal hypertension with associated splenomegaly. No new or enlarging hepatic lesions are identified. 3. No acute findings.   05/13/2016 Tumor Marker   Patient's tumor was tested for the following markers: CA125 Results of the tumor marker test revealed 41.2   06/24/2016 Tumor Marker   Patient's tumor was tested for the following markers: CA125 Results of the tumor marker test revealed 47.3   08/20/2016 Imaging   1. The left external iliac lymph node is slightly increased in size compared to the prior exam, currently 1.1 cm and previously 0.9 cm in diameter. 2. Stable central mesenteric lymph node at 0.9 cm in diameter. 3. Stable trace free pelvic fluid and stable slight thickening along the right paracolic gutter, without well-defined peritoneal nodularity. 4. Other imaging findings of potential clinical significance: Subsegmental atelectasis or scarring in the lung bases. Hepatic cirrhosis. Left renal scarring. Mild splenomegaly. Sigmoid colon diverticulosis. Impingement at L4-5 due to spondylosis and degenerative disc disease broad Schmorl' s nodes at L3-L4. Pelvic floor laxity   08/23/2016 Tumor Marker   Patient's tumor was tested for the following markers: CA125 Results of the tumor marker test revealed 80.2   09/04/2016 Imaging   LV EF: 60% -   65%   09/12/2016 Tumor Marker   Patient's tumor was tested for the following markers: CA125 Results of the tumor marker test revealed 98.5   09/12/2016 - 01/29/2018 Chemotherapy   The patient  received Doxil and Avastin. Avastin was discontinued in Dec 2019 due to GI hemorrhage   09/26/2016 Tumor Marker   Patient's tumor was tested for the following markers: CA125 Results of the tumor marker test revealed 68.9   10/10/2016 Tumor Marker   Patient's tumor was tested for the following markers: CA125 Results of the tumor marker test revealed 65.6   11/07/2016 Tumor Marker   Patient's tumor was tested for the following markers: CA125 Results of the tumor marker test revealed 46.4   11/29/2016 Imaging   Stable mild peritoneal thickening, suspicious for peritoneal carcinomatosis. No new or progressive disease identified. No evidence of ascites.  No significant change in 10 mm left external iliac and 8 mm mesenteric lymph nodes.  Stable hepatic cirrhosis, and splenomegaly consistent with portal venous hypertension.  Colonic diverticulosis, without radiographic evidence of diverticulitis.   12/02/2016 Imaging   Normal LV size with EF 55%. Basal inferior and basal inferoseptal hypokinesis. Normal RV size and systolic function. No significant valvular abnormalities.   12/05/2016 Tumor Marker   Patient's tumor was tested for the following  markers: CA125 Results of the tumor marker test revealed 49.1   01/09/2017 Tumor Marker   Patient's tumor was tested for the following markers: CA125 Results of the tumor marker test revealed 47.2   02/06/2017 Tumor Marker   Patient's tumor was tested for the following markers: CA125 Results of the tumor marker test revealed 44.1   02/18/2017 Imaging   Stable mild peritoneal thickening. No new or progressive disease identified within the abdomen or pelvis.  Stable tiny sub-cm left external iliac and mesenteric lymph nodes.  Stable hepatic cirrhosis, and splenomegaly consistent with portal venous hypertension. No evidence of hepatic neoplasm.  Colonic diverticulosis, without radiographic evidence of diverticulitis.   03/06/2017 Tumor Marker    Patient's tumor was tested for the following markers: CA125 Results of the tumor marker test revealed 50.4   03/17/2017 Imaging   - Left ventricle: The cavity size was normal. Wall thickness was normal. Systolic function was normal. The estimated ejection fraction was in the range of 55% to 60%. Wall motion was normal; there were no regional wall motion abnormalities. Features are consistent with a pseudonormal left ventricular filling pattern, with concomitant abnormal relaxation and increased filling pressure (grade 2 diastolic dysfunction). - Mitral valve: There was mild regurgitation. - Left atrium: The atrium was mildly dilated   04/03/2017 Tumor Marker   Patient's tumor was tested for the following markers: CA125 Results of the tumor marker test revealed 47.2   05/14/2017 Imaging   CT scan of abdomen and pelvis Stable mild peritoneal thickening. No new or progressive disease within the abdomen or pelvis.  Stable hepatic cirrhosis. Stable splenomegaly, consistent with portal venous hypertension. No evidence of hepatic neoplasm.   05/15/2017 Tumor Marker   Patient's tumor was tested for the following markers: CA125 Results of the tumor marker test revealed 56.1   06/12/2017 Tumor Marker   Patient's tumor was tested for the following markers: CA125 Results of the tumor marker test revealed 49.2   07/10/2017 Tumor Marker   Patient's tumor was tested for the following markers: CA125 Results of the tumor marker test revealed 46.3   08/07/2017 Tumor Marker   Patient's tumor was tested for the following markers: CA125 Results of the tumor marker test revealed 52.9   08/20/2017 Imaging   1. Mild omental/peritoneal haziness, unchanged. No evidence of new metastatic disease. 2. Cirrhosis with splenomegaly.   09/04/2017 Tumor Marker   Patient's tumor was tested for the following markers: CA125 Results of the tumor marker test revealed 48.5   11/13/2017 Tumor Marker   Patient's tumor was tested  for the following markers: CA125 Results of the tumor marker test revealed 55.8   12/17/2017 Imaging   1. No findings to suggest metastatic disease in the abdomen or pelvis. 2. Severe hepatic cirrhosis with evidence of portal hypertension, as demonstrated by dilated portal vein, splenomegaly and portosystemic collateral pathways, including gastric and esophageal varices.  3. Colonic diverticulosis without evidence of acute diverticulitis at this time. 4. Additional incidental findings, as above.   01/16/2018 Tumor Marker   Patient's tumor was tested for the following markers: CA125 Results of the tumor marker test revealed 63.7   02/10/2018 Procedure   EGD - Recently bleeding grade III and large (> 5 mm) esophageal varices. Completely eradicated. Banded. - Portal hypertensive gastropathy. - Normal examined duodenum. - No specimens collected   02/13/2018 Tumor Marker   Patient's tumor was tested for the following markers: CA125 Results of the tumor marker test revealed 82.7   02/25/2018 Imaging  Bone density showed mild osteopenia   04/08/2018 Tumor Marker   Patient's tumor was tested for the following markers: CA125 Results of the tumor marker test revealed 82.7   04/08/2018 Imaging   1. No definite omental or peritoneal surface lesions. However, there are 2 slowly enlarging lymph nodes noted. One is in the small bowel mesentery and the other is in the left deep pelvis. Could not exclude recurrent disease. 2. Stable advanced cirrhotic changes involving the liver with portal venous hypertension, portal venous collaterals, esophageal varices and splenomegaly. No worrisome hepatic lesions. 3. Diffuse wall thickening of the colon could suggest diffuse colitis or could be due to low albumin. Recommend correlation with any symptoms such as diarrhea.   05/21/2018 Tumor Marker   Patient's tumor was tested for the following markers: CA125 Results of the tumor marker test revealed 86    09/07/2018 Tumor Marker   Patient's tumor was tested for the following markers:CA-125 Results of the tumor marker test revealed 111   09/29/2018 Tumor Marker   Patient's tumor was tested for the following markers: CA-125 Results of the tumor marker test revealed 121.   09/29/2018 - 11/24/2018 Chemotherapy   The patient had carboplatin and gemzar for chemotherapy treatment.  Chemo was stopped due to allergic reaction to carboplatin   10/27/2018 Tumor Marker   Patient's tumor was tested for the following markers: CA-125 Results of the tumor marker test revealed 99.9   11/24/2018 Tumor Marker   Patient's tumor was tested for the following markers: CA-125 Results of the tumor marker test revealed 81.2   12/14/2018 Tumor Marker   Patient's tumor was tested for the following markers: CA-125 Results of the tumor marker test revealed 81.2   12/14/2018 Imaging   1. Mild improvement in peritoneal disease. Peritoneal nodule within the left posterior pelvis contiguous with the left side of vaginal cuff is slightly decreased in size in the interval. Within the right iliac fossa there is a small peritoneal nodule which has decreased in size in the interval. Central small bowel mesenteric lymph node is not significantly changed. Slight decrease in size left periaortic lymph node.    2. Morphologic features of the liver compatible with cirrhosis. Splenomegaly is noted which may reflect portal venous hypertension.   12/21/2018 Tumor Marker   Patient's tumor was tested for the following markers: CA-125 Results of the tumor marker test revealed 64.2   12/21/2018 -  Chemotherapy   The patient had taxotere for chemotherapy treatment.     03/02/2019 Tumor Marker   Patient's tumor was tested for the following markers: CA-125. 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   No new or progressive metastatic  disease within the abdomen or pelvis.   Hepatic cirrhosis and findings of portal venous hypertension. 1.1 cm low-attenuation lesion within left hepatic lobe, which could represent a regenerative nodule or small hepatocellular carcinoma. Recommend abdomen MRI without and with contrast for further characterization.     04/13/2019 Tumor Marker   Patient's tumor was tested for the following markers: CA-125 Results of the tumor marker test revealed 36.8   05/26/2019 Tumor Marker   Patient's tumor was tested for the following markers: CA-125 Results of the tumor marker test revealed 31.6.   06/15/2019 Tumor Marker   Patient's tumor was tested for the following markers: CA-125 Results of the tumor marker test revealed 25.4   07/05/2019 Imaging   1. Stable  CT examination. Stable peritoneal thickening in the pelvis and paracolic gutters. Stable solid left deep pelvic implant. Stable trace ascites. No new or progressive metastatic disease. 2. Stable cirrhosis. No discrete liver masses. 3. Stable mild-to-moderate splenomegaly. 4. Aortic Atherosclerosis (ICD10-I70.0).   07/27/2019 Tumor Marker   Patient's tumor was tested for the following markers: CA-125 Results of the tumor marker test revealed 35.4   08/17/2019 Tumor Marker   Patient's tumor was tested for the following markers: Ca-125 Results of the tumor marker test revealed 35.7.   09/17/2019 - 09/21/2019 Hospital Admission   She was admitted due to esophageal variceal bleeding presents with hematemesis and melanotic stool with epigastric pain.  GI consulted, started on octreotide infusion.  Underwent EGD which showed minimal bleeding, remain on octreotide drip, PPI drip.  Hemoglobin trended down therefore received PRBC transfusion.  Over several days her bleeding subsided, hemodynamically remained stable.    09/17/2019 Procedure   EGD report - Esophageal mucosal scarring, from prior esophageal banding sessions. - Grade I esophageal varices. - Red  blood in the cardia and in the gastric fundus. - Portal hypertensive gastropathy. - Normal duodenal bulb, first portion of the duodenum and second portion of the duodenum. - No obvious source of bleeding identified; no ongoing/active bleeding seen during our exam; possible explanations for bleeding include: variceal bleeding (gastric or esophageal) that had resolved and varices flattened with medical therapy versus dieulafoy lesion versus portal gastropathy bleeding versus other     REVIEW OF SYSTEMS:   Constitutional: Denies fevers, chills  Eyes: Denies blurriness of vision Ears, nose, mouth, throat, and face: Denies mucositis or sore throat Respiratory: Denies cough, dyspnea or wheezes Cardiovascular: Denies palpitation, chest discomfort or lower extremity swelling Gastrointestinal:  Denies nausea, heartburn or change in bowel habits Skin: Denies abnormal skin rashes Lymphatics: Denies new lymphadenopathy or easy bruising Neurological:Denies numbness, tingling or new weaknesses Behavioral/Psych: Mood is stable, no new changes  All other systems were reviewed with the patient and are negative.  I have reviewed the past medical history, past surgical history, social history and family history with the patient and they are unchanged from previous note.  ALLERGIES:  is allergic to carboplatin, shellfish allergy, vancomycin, benadryl [diphenhydramine hcl], and penicillins.  MEDICATIONS:  Current Outpatient Medications  Medication Sig Dispense Refill  . cholecalciferol (VITAMIN D) 1000 units tablet Take 1,000 Units by mouth at bedtime.     Marland Kitchen dexamethasone (DECADRON) 4 MG tablet Start the day before Taxotere, take 1 tablet in the morning, and then 1 tablet daily after chemo for 2 days. Pls dispense 30 tabs (Patient taking differently: Take 4 mg by mouth See admin instructions. Start the day before Taxotere, take 1 tablet in the morning, and then 1 tablet daily after chemo for 2 days. Pls  dispense 30 tabs) 30 tablet 1  . gabapentin (NEURONTIN) 300 MG capsule TAKE 3 CAPSULES(900 MG) BY MOUTH TWICE DAILY 120 capsule 11  . HUMALOG MIX 75/25 KWIKPEN (75-25) 100 UNIT/ML Kwikpen Inject 22 Units into the skin 2 (two) times daily. Before breakfast & before supper Except when taking Chemo take 32 units  For three to five days    . HYDROcodone-acetaminophen (NORCO/VICODIN) 5-325 MG tablet Take 1 tablet by mouth every 6 (six) hours as needed for moderate pain. 60 tablet 0  . lidocaine-prilocaine (EMLA) cream Apply to Porta-cath  1-2 hours prior to access as directed. (Patient taking differently: Apply 1 application topically daily as needed (port access). ) 30 g 9  . nadolol (  CORGARD) 20 MG tablet Take 1 tablet (20 mg total) by mouth daily. (Patient taking differently: Take 20 mg by mouth at bedtime. ) 90 tablet 3  . ondansetron (ZOFRAN) 8 MG tablet Take 1 tablet (8 mg total) by mouth every 8 (eight) hours as needed. Start on the third day after chemotherapy. (Patient taking differently: Take 8 mg by mouth every 8 (eight) hours as needed for nausea. Start on the third day after chemotherapy.) 30 tablet 1  . pantoprazole (PROTONIX) 40 MG tablet Take 1 tablet (40 mg total) by mouth daily.    Marland Kitchen PROAIR HFA 108 (90 Base) MCG/ACT inhaler Inhale 2 puffs into the lungs every 4 (four) hours as needed for wheezing or shortness of breath.     . prochlorperazine (COMPAZINE) 10 MG tablet Take 1 tablet (10 mg total) by mouth every 6 (six) hours as needed (Nausea or vomiting). 30 tablet 1  . rotigotine (NEUPRO) 4 MG/24HR APPLY 1 PATCH EXTERNALLY TO THE SKIN DAILY 30 patch 11   No current facility-administered medications for this visit.    PHYSICAL EXAMINATION: ECOG PERFORMANCE STATUS: 1 - Symptomatic but completely ambulatory  Vitals:   10/26/19 0801  Pulse: (!) 3  Temp: 97.8 F (36.6 C)  SpO2: 100%   Filed Weights   10/26/19 0801  Weight: 193 lb 12.8 oz (87.9 kg)    GENERAL:alert, no distress  and comfortable NEURO: alert & oriented x 3 with fluent speech, no focal motor/sensory deficits  LABORATORY DATA:  I have reviewed the data as listed    Component Value Date/Time   NA 139 10/26/2019 0722   NA 142 02/06/2017 0742   K 4.4 10/26/2019 0722   K 3.9 02/06/2017 0742   CL 108 10/26/2019 0722   CO2 22 10/26/2019 0722   CO2 22 02/06/2017 0742   GLUCOSE 171 (H) 10/26/2019 0722   GLUCOSE 156 (H) 02/06/2017 0742   BUN 15 10/26/2019 0722   BUN 13.5 02/06/2017 0742   CREATININE 0.85 10/26/2019 0722   CREATININE 0.8 02/06/2017 0742   CALCIUM 8.5 (L) 10/26/2019 0722   CALCIUM 8.5 02/06/2017 0742   PROT 6.7 10/26/2019 0722   PROT 6.9 02/06/2017 0742   ALBUMIN 3.4 (L) 10/26/2019 0722   ALBUMIN 3.7 02/06/2017 0742   AST 24 10/26/2019 0722   AST 47 (H) 02/06/2017 0742   ALT 17 10/26/2019 0722   ALT 54 02/06/2017 0742   ALKPHOS 111 10/26/2019 0722   ALKPHOS 130 02/06/2017 0742   BILITOT 0.7 10/26/2019 0722   BILITOT 0.65 02/06/2017 0742   GFRNONAA >60 10/26/2019 0722   GFRAA >60 10/26/2019 0722    No results found for: SPEP, UPEP  Lab Results  Component Value Date   WBC 3.1 (L) 10/26/2019   NEUTROABS 2.1 10/26/2019   HGB 9.9 (L) 10/26/2019   HCT 32.7 (L) 10/26/2019   MCV 92.4 10/26/2019   PLT 95 (L) 10/26/2019      Chemistry      Component Value Date/Time   NA 139 10/26/2019 0722   NA 142 02/06/2017 0742   K 4.4 10/26/2019 0722   K 3.9 02/06/2017 0742   CL 108 10/26/2019 0722   CO2 22 10/26/2019 0722   CO2 22 02/06/2017 0742   BUN 15 10/26/2019 0722   BUN 13.5 02/06/2017 0742   CREATININE 0.85 10/26/2019 0722   CREATININE 0.8 02/06/2017 0742      Component Value Date/Time   CALCIUM 8.5 (L) 10/26/2019 0722   CALCIUM 8.5 02/06/2017 0742  ALKPHOS 111 10/26/2019 0722   ALKPHOS 130 02/06/2017 0742   AST 24 10/26/2019 0722   AST 47 (H) 02/06/2017 0742   ALT 17 10/26/2019 0722   ALT 54 02/06/2017 0742   BILITOT 0.7 10/26/2019 0722   BILITOT 0.65  02/06/2017 4656

## 2019-10-27 ENCOUNTER — Other Ambulatory Visit: Payer: Self-pay | Admitting: Hematology and Oncology

## 2019-10-27 DIAGNOSIS — C569 Malignant neoplasm of unspecified ovary: Secondary | ICD-10-CM

## 2019-10-27 DIAGNOSIS — C786 Secondary malignant neoplasm of retroperitoneum and peritoneum: Secondary | ICD-10-CM

## 2019-10-27 DIAGNOSIS — C482 Malignant neoplasm of peritoneum, unspecified: Secondary | ICD-10-CM

## 2019-10-28 ENCOUNTER — Telehealth: Payer: Self-pay | Admitting: Neurology

## 2019-10-28 MED ORDER — NEUPRO 4 MG/24HR TD PT24: MEDICATED_PATCH | TRANSDERMAL | 6 refills | Status: DC

## 2019-10-28 NOTE — Telephone Encounter (Signed)
Sorry!  I don't think that we sent it because we gave her all those samples!  My mistake.  Go ahead and send.

## 2019-10-28 NOTE — Telephone Encounter (Signed)
Reviewed patients chart and saw that Dr Tat sent rx electronically over on 10/25/2019 with a 30 day supply with 11 refills.  Spoke with pharmacist and she stated the rx was not received.   Rx(s) sent to pharmacy electronically.  Patient notified directly and voiced understanding.

## 2019-10-28 NOTE — Telephone Encounter (Signed)
Patient called and said her pharmacy never received her prescription for: Neupro 4 MG/24 hour.  [facility] on [address]

## 2019-11-01 ENCOUNTER — Ambulatory Visit: Payer: Medicare Other | Admitting: Hematology and Oncology

## 2019-11-02 ENCOUNTER — Ambulatory Visit: Payer: Medicare Other | Admitting: Neurology

## 2019-11-02 ENCOUNTER — Ambulatory Visit: Payer: Medicare Other

## 2019-11-05 ENCOUNTER — Other Ambulatory Visit: Payer: Self-pay

## 2019-11-05 ENCOUNTER — Ambulatory Visit (HOSPITAL_COMMUNITY)
Admission: RE | Admit: 2019-11-05 | Discharge: 2019-11-05 | Disposition: A | Discharge status: Home / Self Care | Payer: Medicare Other | Source: Ambulatory Visit | Attending: Hematology and Oncology | Admitting: Hematology and Oncology

## 2019-11-05 DIAGNOSIS — C786 Secondary malignant neoplasm of retroperitoneum and peritoneum: Secondary | ICD-10-CM

## 2019-11-05 DIAGNOSIS — C801 Malignant (primary) neoplasm, unspecified: Secondary | ICD-10-CM | POA: Diagnosis present

## 2019-11-05 DIAGNOSIS — C482 Malignant neoplasm of peritoneum, unspecified: Secondary | ICD-10-CM | POA: Diagnosis present

## 2019-11-05 DIAGNOSIS — C569 Malignant neoplasm of unspecified ovary: Secondary | ICD-10-CM

## 2019-11-05 MED ORDER — IOHEXOL 9 MG/ML PO SOLN
ORAL | Status: AC
  Filled 2019-11-05: qty 1000

## 2019-11-05 MED ORDER — HEPARIN SOD (PORK) LOCK FLUSH 100 UNIT/ML IV SOLN
INTRAVENOUS | Status: AC
  Filled 2019-11-05: qty 5

## 2019-11-05 MED ORDER — HEPARIN SOD (PORK) LOCK FLUSH 100 UNIT/ML IV SOLN
500.0000 [IU] | Freq: Once | INTRAVENOUS | Status: AC
  Administered 2019-11-05: 500 [IU] via INTRAVENOUS

## 2019-11-05 MED ORDER — IOHEXOL 9 MG/ML PO SOLN
500.0000 mL | ORAL | Status: AC
  Administered 2019-11-05 (×2): 500 mL via ORAL

## 2019-11-05 MED ORDER — IOHEXOL 300 MG/ML  SOLN
100.0000 mL | Freq: Once | INTRAMUSCULAR | Status: AC | PRN
  Administered 2019-11-05: 100 mL via INTRAVENOUS

## 2019-11-08 ENCOUNTER — Other Ambulatory Visit: Payer: Self-pay | Admitting: Hematology and Oncology

## 2019-11-08 IMAGING — CR DG ANKLE COMPLETE 3+V*R*
3 series · 3 of 3 positions shown · non-contrast
Comparison: None.

CLINICAL DATA: Injury

EXAM:
RIGHT ANKLE - COMPLETE 3+ VIEW

[x ankle ap right]
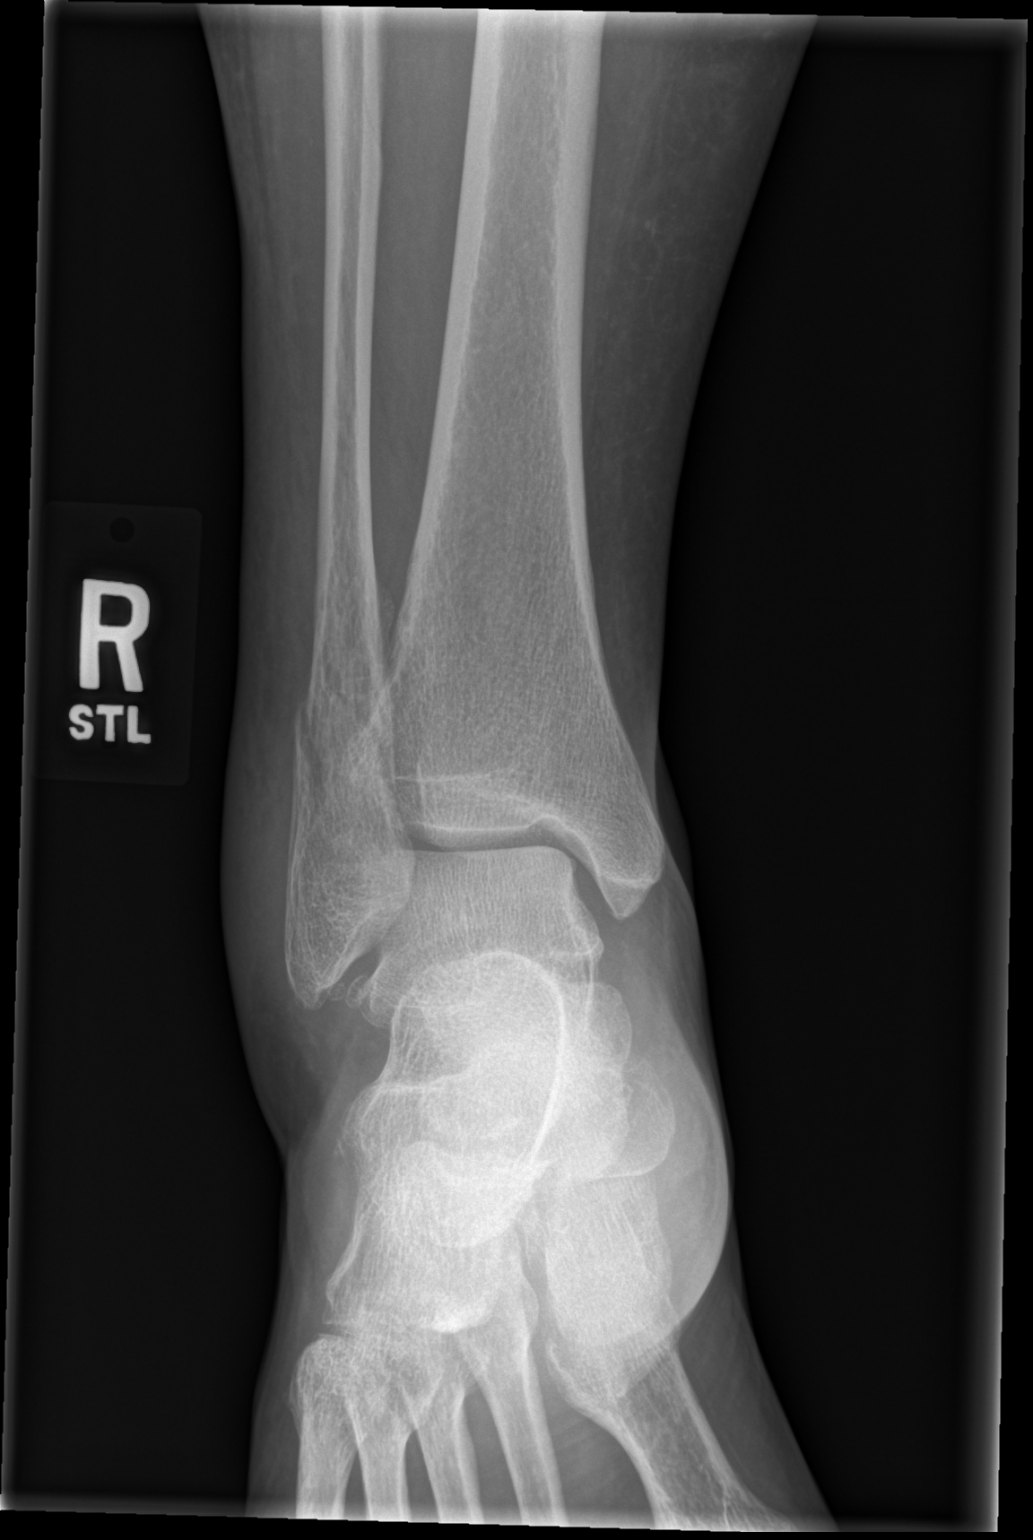

[x ankle obl right]
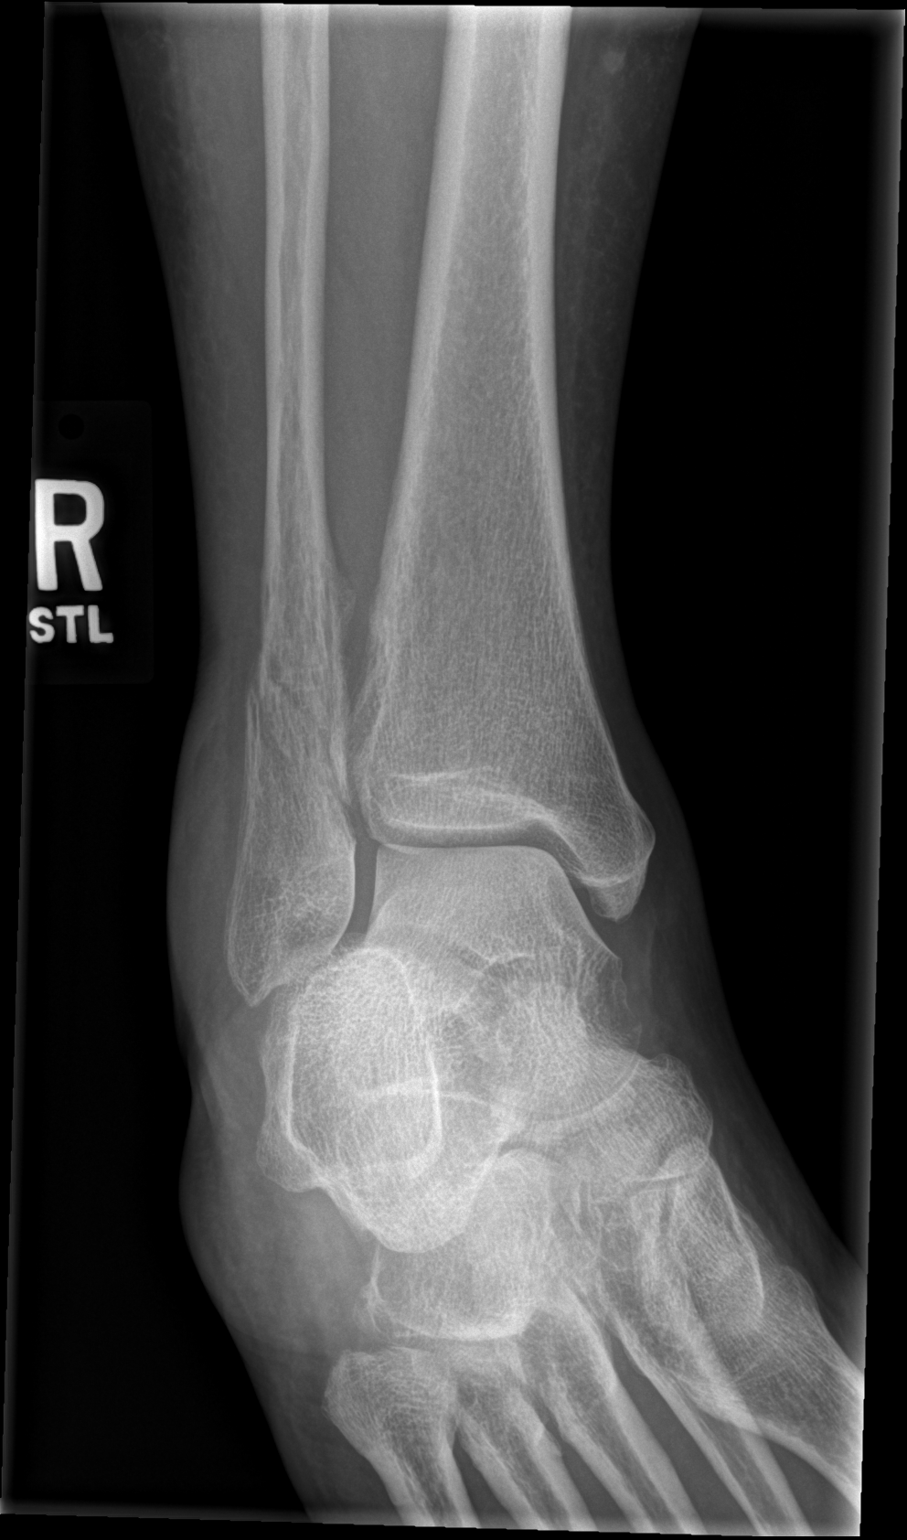

[x ankle lat right]
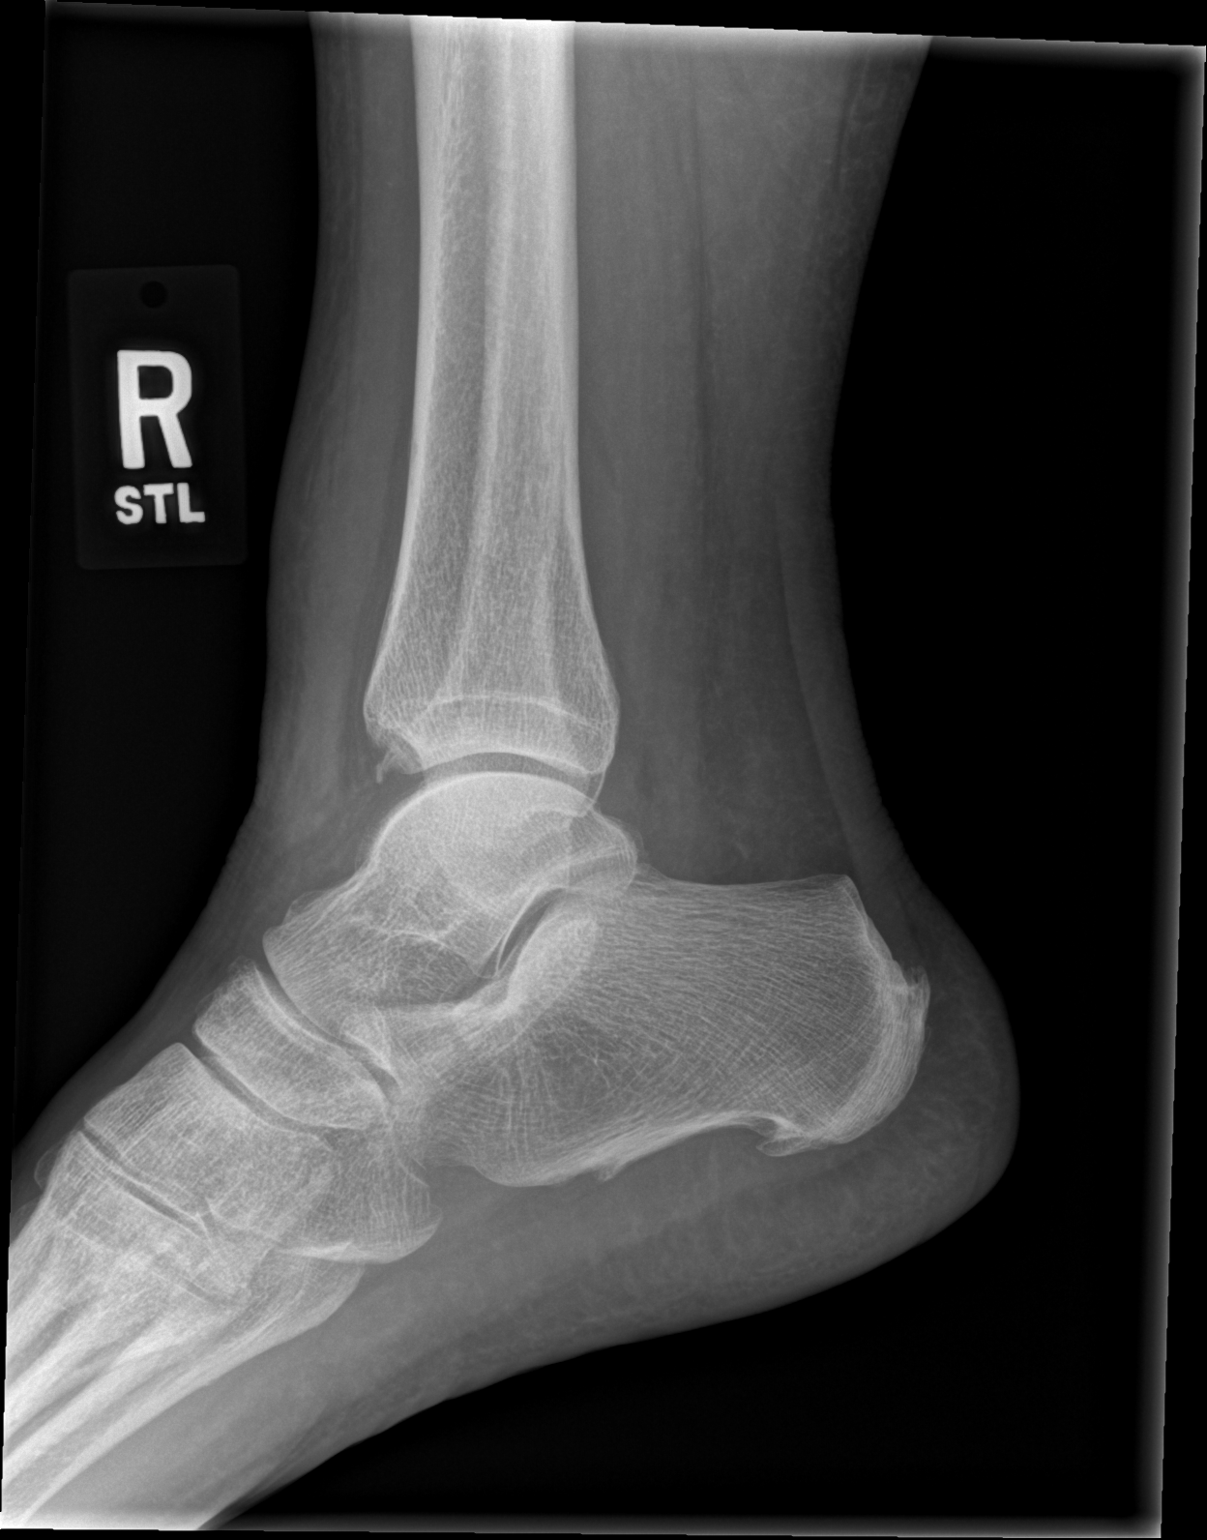

[3 of 3 positions shown; findings below may reference images not displayed]

FINDINGS: Acute fracture involving the distal metaphysis of the fibula with
less than [DATE] bone with lateral displacement of distal fracture
fragment. Mortise is symmetric. Suspected small avulsion fracture
injury involving the distal anterior inferior aspect of the tibia.
Small plantar calcaneal spur. Diffuse soft tissue swelling. Ossicles
or old injury adjacent to the tip of the fibular malleolus.
IMPRESSION: 1. Acute mildly displaced distal fibular fracture
2. Probable acute cortical avulsion injury off the distal anterior
tibia.

## 2019-11-08 IMAGING — CR DG TIBIA/FIBULA 2V*R*
3 series · 3 of 3 positions shown · non-contrast
Comparison: None.

CLINICAL DATA: Ankle fracture

EXAM:
RIGHT TIBIA AND FIBULA - 2 VIEW

[x tib-fib ap right]
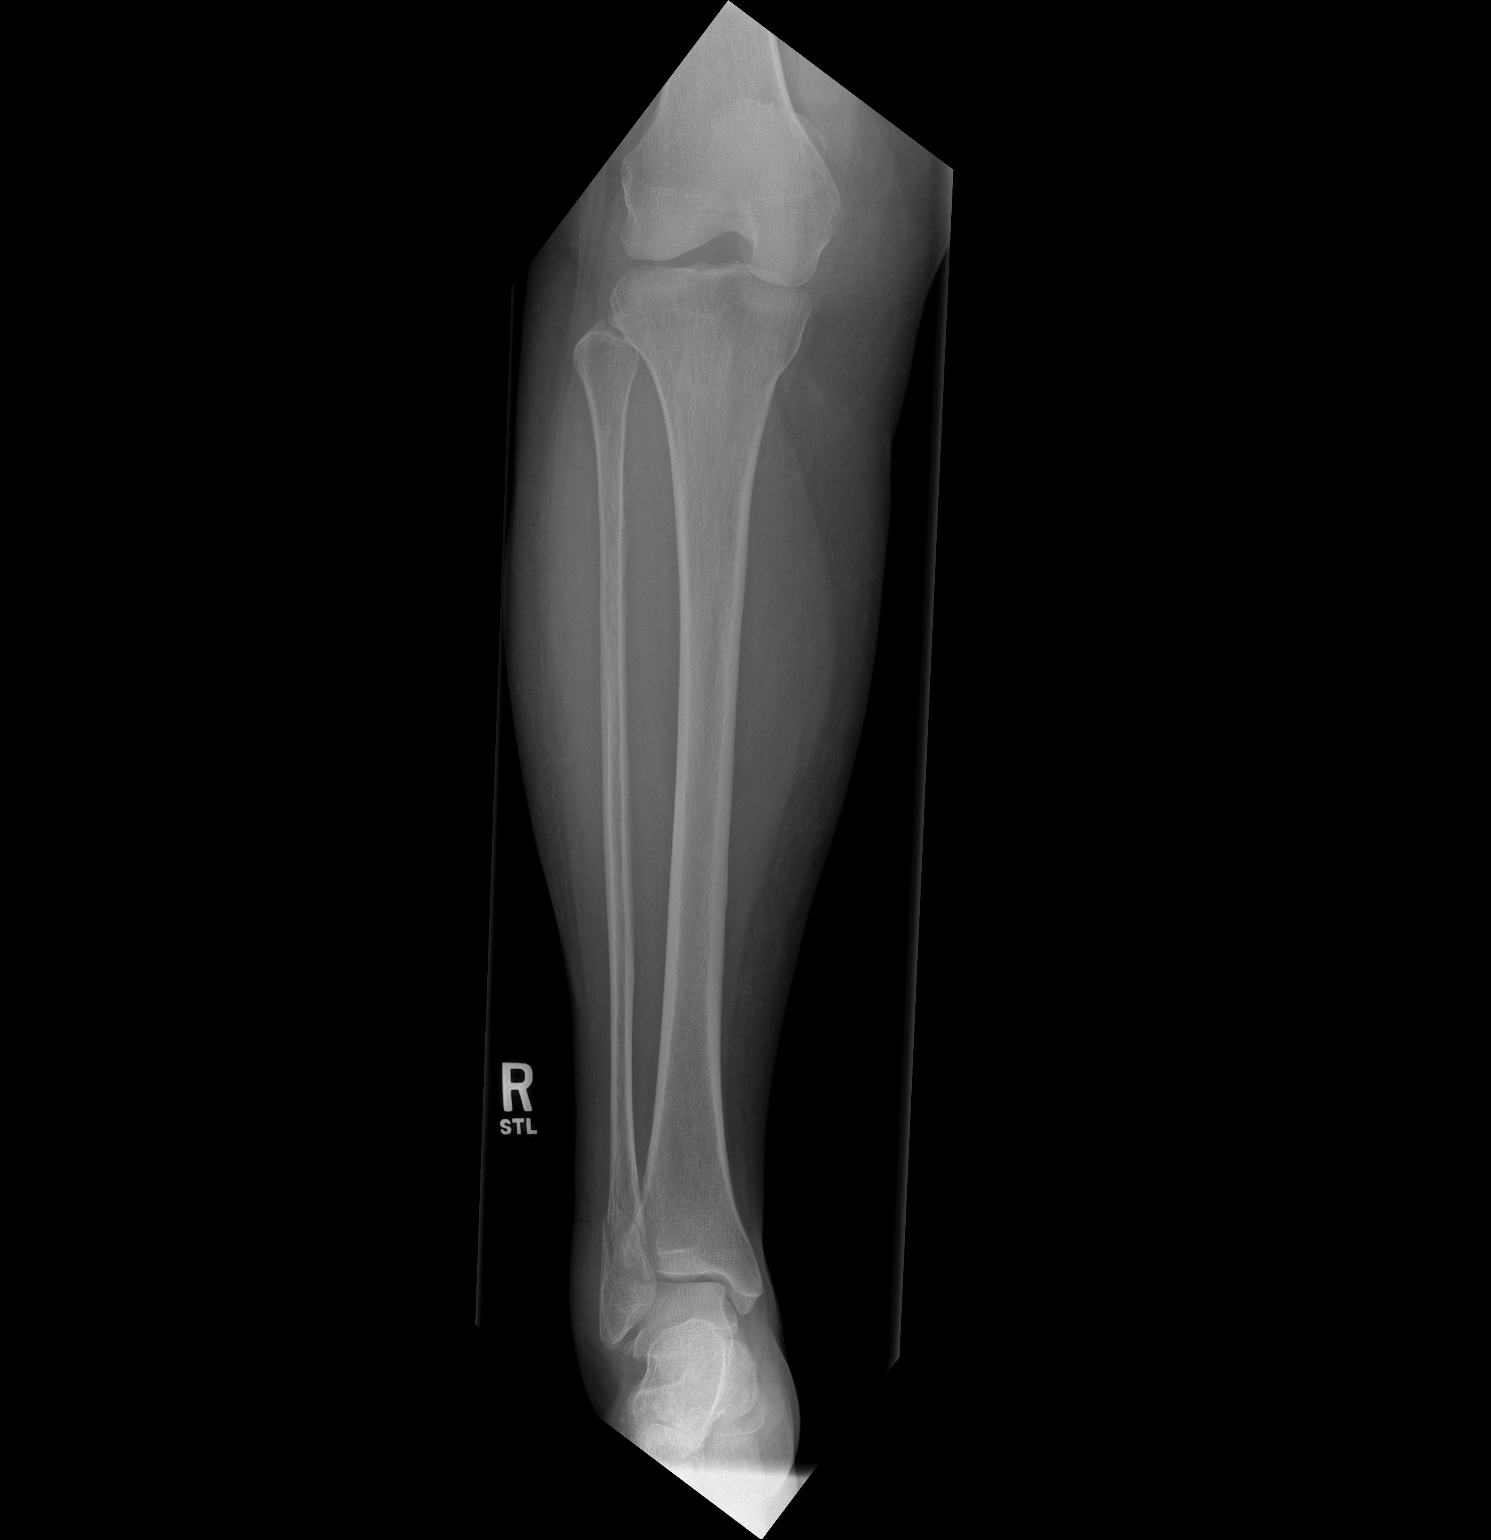

[x tib-fib lat right (1 of 2)]
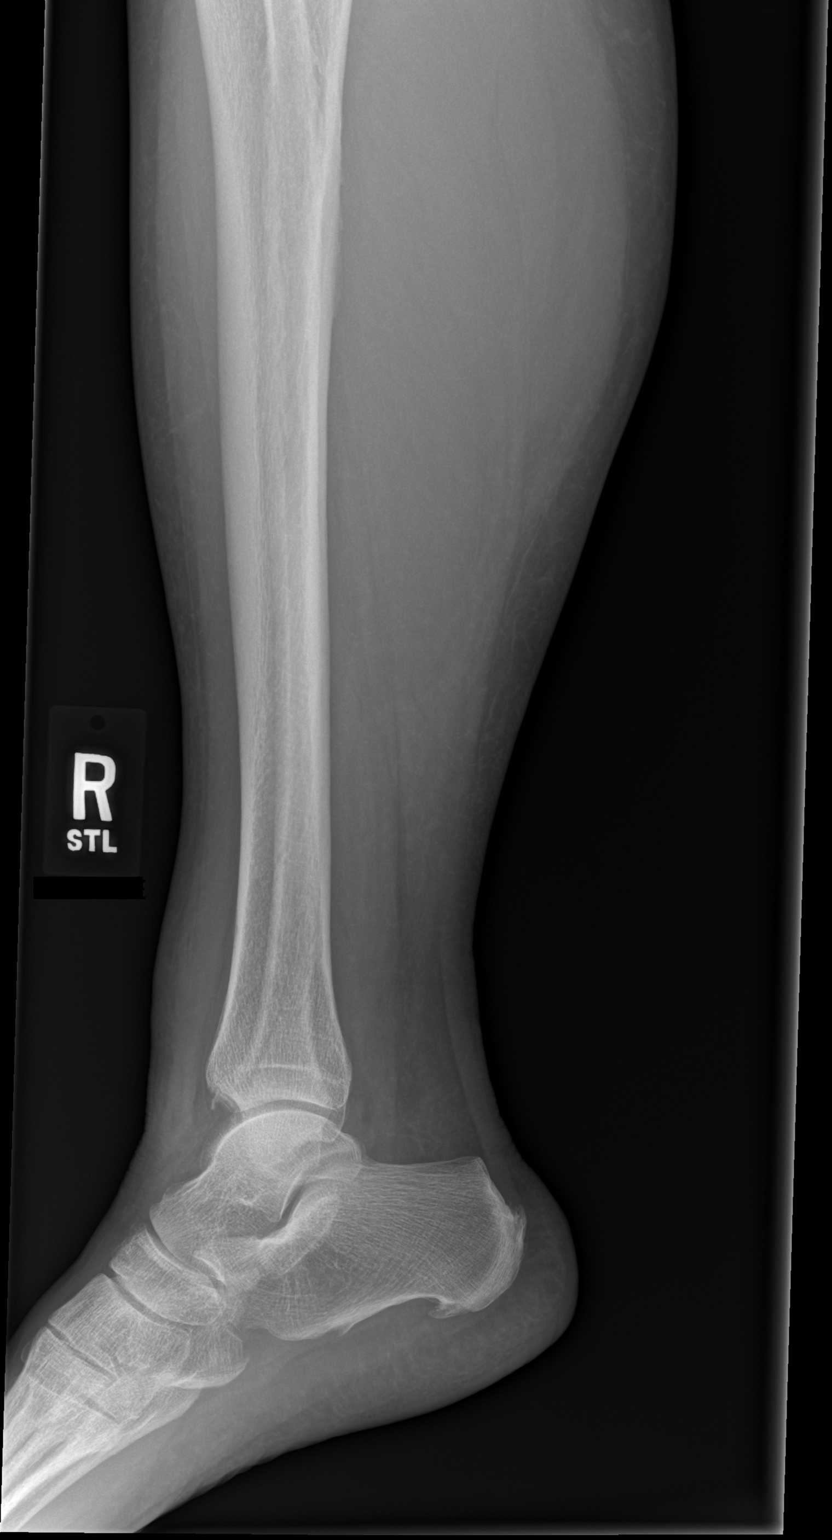

[x tib-fib lat right (2 of 2)]
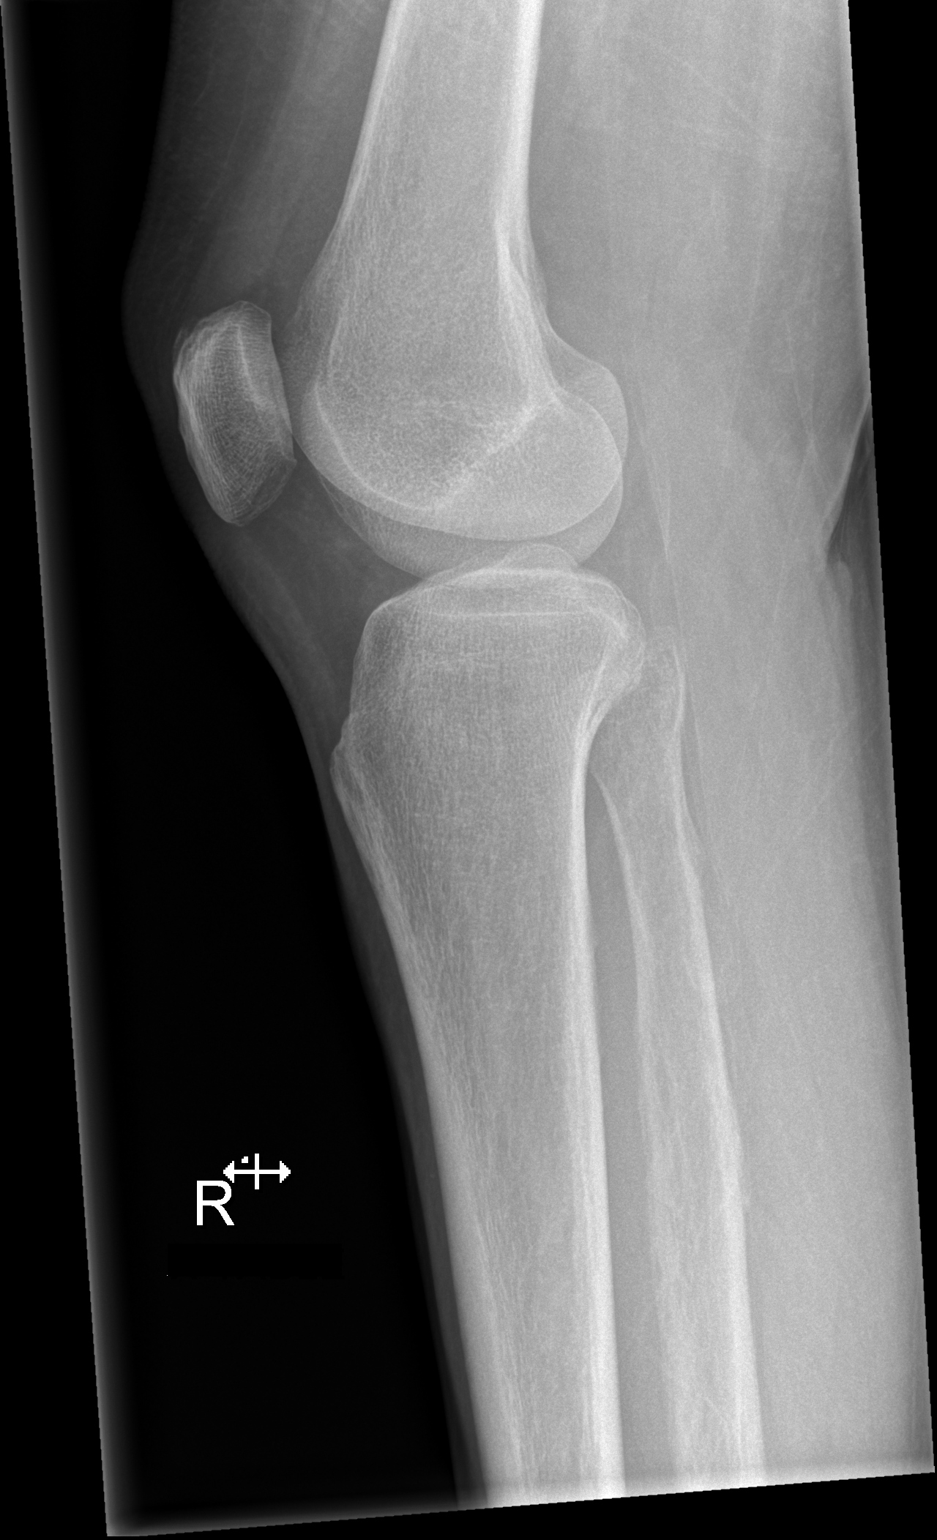

[3 of 3 positions shown; findings below may reference images not displayed]

FINDINGS: Acute mildly displaced distal fibular fracture. Diffuse soft tissue
swelling. Probable cortical avulsion off the distal anterior aspect
of the tibia.
IMPRESSION: 1. Acute mildly displaced distal fibular fracture
2. Probable acute mildly displaced fracture off the anterior distal
cortical surface of the tibia

## 2019-11-09 ENCOUNTER — Inpatient Hospital Stay: Payer: Medicare Other

## 2019-11-09 ENCOUNTER — Other Ambulatory Visit: Payer: Self-pay

## 2019-11-09 ENCOUNTER — Telehealth: Payer: Self-pay | Admitting: Hematology and Oncology

## 2019-11-09 ENCOUNTER — Inpatient Hospital Stay (HOSPITAL_BASED_OUTPATIENT_CLINIC_OR_DEPARTMENT_OTHER): Payer: Medicare Other | Admitting: Hematology and Oncology

## 2019-11-09 ENCOUNTER — Encounter: Payer: Self-pay | Admitting: Hematology and Oncology

## 2019-11-09 DIAGNOSIS — C482 Malignant neoplasm of peritoneum, unspecified: Secondary | ICD-10-CM

## 2019-11-09 DIAGNOSIS — D61818 Other pancytopenia: Secondary | ICD-10-CM

## 2019-11-09 DIAGNOSIS — K55069 Acute infarction of intestine, part and extent unspecified: Secondary | ICD-10-CM

## 2019-11-09 DIAGNOSIS — Z5111 Encounter for antineoplastic chemotherapy: Secondary | ICD-10-CM | POA: Diagnosis not present

## 2019-11-09 DIAGNOSIS — C569 Malignant neoplasm of unspecified ovary: Secondary | ICD-10-CM

## 2019-11-09 DIAGNOSIS — Z794 Long term (current) use of insulin: Secondary | ICD-10-CM

## 2019-11-09 DIAGNOSIS — T451X5A Adverse effect of antineoplastic and immunosuppressive drugs, initial encounter: Secondary | ICD-10-CM

## 2019-11-09 DIAGNOSIS — Z7189 Other specified counseling: Secondary | ICD-10-CM

## 2019-11-09 DIAGNOSIS — E1140 Type 2 diabetes mellitus with diabetic neuropathy, unspecified: Secondary | ICD-10-CM | POA: Diagnosis not present

## 2019-11-09 DIAGNOSIS — K922 Gastrointestinal hemorrhage, unspecified: Secondary | ICD-10-CM

## 2019-11-09 DIAGNOSIS — G620 Drug-induced polyneuropathy: Secondary | ICD-10-CM

## 2019-11-09 LAB — CBC WITH DIFFERENTIAL/PLATELET
Abs Immature Granulocytes: 0.01 10*3/uL (ref 0.00–0.07)
Basophils Absolute: 0 10*3/uL (ref 0.0–0.1)
Basophils Relative: 0 %
Eosinophils Absolute: 0 10*3/uL (ref 0.0–0.5)
Eosinophils Relative: 1 %
HCT: 31.2 % — ABNORMAL LOW (ref 36.0–46.0)
Hemoglobin: 9.8 g/dL — ABNORMAL LOW (ref 12.0–15.0)
Immature Granulocytes: 0 %
Lymphocytes Relative: 22 %
Lymphs Abs: 0.8 10*3/uL (ref 0.7–4.0)
MCH: 28 pg (ref 26.0–34.0)
MCHC: 31.4 g/dL (ref 30.0–36.0)
MCV: 89.1 fL (ref 80.0–100.0)
Monocytes Absolute: 0.2 10*3/uL (ref 0.1–1.0)
Monocytes Relative: 6 %
Neutro Abs: 2.5 10*3/uL (ref 1.7–7.7)
Neutrophils Relative %: 71 %
Platelets: 98 10*3/uL — ABNORMAL LOW (ref 150–400)
RBC: 3.5 MIL/uL — ABNORMAL LOW (ref 3.87–5.11)
RDW: 14.7 % (ref 11.5–15.5)
WBC: 3.5 10*3/uL — ABNORMAL LOW (ref 4.0–10.5)
nRBC: 0 % (ref 0.0–0.2)

## 2019-11-09 LAB — COMPREHENSIVE METABOLIC PANEL
ALT: 19 U/L (ref 0–44)
AST: 18 U/L (ref 15–41)
Albumin: 3.4 g/dL — ABNORMAL LOW (ref 3.5–5.0)
Alkaline Phosphatase: 130 U/L — ABNORMAL HIGH (ref 38–126)
Anion gap: 5 (ref 5–15)
BUN: 16 mg/dL (ref 8–23)
CO2: 25 mmol/L (ref 22–32)
Calcium: 8.5 mg/dL — ABNORMAL LOW (ref 8.9–10.3)
Chloride: 109 mmol/L (ref 98–111)
Creatinine, Ser: 0.94 mg/dL (ref 0.44–1.00)
GFR calc Af Amer: >60 mL/min (ref >60)
GFR calc non Af Amer: >60 mL/min (ref >60)
Glucose, Bld: 325 mg/dL — ABNORMAL HIGH (ref 70–99)
Potassium: 3.7 mmol/L (ref 3.5–5.1)
Sodium: 139 mmol/L (ref 135–145)
Total Bilirubin: 0.6 mg/dL (ref 0.3–1.2)
Total Protein: 7 g/dL (ref 6.5–8.1)

## 2019-11-09 LAB — IRON AND TIBC
Iron: 34 ug/dL — ABNORMAL LOW (ref 41–142)
Saturation Ratios: 8 % — ABNORMAL LOW (ref 21–57)
TIBC: 415 ug/dL (ref 236–444)
UIBC: 381 ug/dL (ref 120–384)

## 2019-11-09 LAB — FERRITIN: Ferritin: 15 ng/mL (ref 11–307)

## 2019-11-09 MED ORDER — SODIUM CHLORIDE 0.9% FLUSH
10.0000 mL | Freq: Once | INTRAVENOUS | Status: AC
  Administered 2019-11-09: 10 mL
  Filled 2019-11-09: qty 10

## 2019-11-09 MED ORDER — HEPARIN SOD (PORK) LOCK FLUSH 100 UNIT/ML IV SOLN
500.0000 [IU] | Freq: Once | INTRAVENOUS | Status: DC | PRN
  Filled 2019-11-09: qty 5

## 2019-11-09 MED ORDER — SODIUM CHLORIDE 0.9 % IV SOLN
Freq: Once | INTRAVENOUS | Status: AC
  Filled 2019-11-09: qty 250

## 2019-11-09 MED ORDER — DEXAMETHASONE SODIUM PHOSPHATE 10 MG/ML IJ SOLN
INTRAMUSCULAR | Status: AC
  Filled 2019-11-09: qty 1

## 2019-11-09 MED ORDER — PEGFILGRASTIM 6 MG/0.6ML ~~LOC~~ PSKT
6.0000 mg | PREFILLED_SYRINGE | Freq: Once | SUBCUTANEOUS | Status: AC
  Administered 2019-11-09: 6 mg via SUBCUTANEOUS

## 2019-11-09 MED ORDER — SODIUM CHLORIDE 0.9 % IV SOLN
510.0000 mg | Freq: Once | INTRAVENOUS | Status: AC
  Administered 2019-11-09: 510 mg via INTRAVENOUS
  Filled 2019-11-09: qty 510

## 2019-11-09 MED ORDER — SODIUM CHLORIDE 0.9 % IV SOLN
37.5000 mg/m2 | Freq: Once | INTRAVENOUS | Status: AC
  Administered 2019-11-09: 70 mg via INTRAVENOUS
  Filled 2019-11-09: qty 7

## 2019-11-09 MED ORDER — INSULIN REGULAR HUMAN 100 UNIT/ML IJ SOLN
INTRAMUSCULAR | Status: AC
  Filled 2019-11-09: qty 1

## 2019-11-09 MED ORDER — PEGFILGRASTIM 6 MG/0.6ML ~~LOC~~ PSKT
PREFILLED_SYRINGE | SUBCUTANEOUS | Status: AC
  Filled 2019-11-09: qty 0.6

## 2019-11-09 MED ORDER — INSULIN REGULAR HUMAN 100 UNIT/ML IJ SOLN
10.0000 [IU] | Freq: Once | INTRAMUSCULAR | Status: AC
  Administered 2019-11-09: 10 [IU] via SUBCUTANEOUS

## 2019-11-09 MED ORDER — SODIUM CHLORIDE 0.9% FLUSH
10.0000 mL | INTRAVENOUS | Status: DC | PRN
  Filled 2019-11-09: qty 10

## 2019-11-09 MED ORDER — DEXAMETHASONE SODIUM PHOSPHATE 10 MG/ML IJ SOLN
5.0000 mg | Freq: Once | INTRAMUSCULAR | Status: AC
  Administered 2019-11-09: 5 mg via INTRAVENOUS

## 2019-11-09 NOTE — Assessment & Plan Note (Signed)
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

## 2019-11-09 NOTE — Assessment & Plan Note (Signed)
We have extensive discussions about goals of care Her disease is stable on treatment, however, has caused numerous different side effects We discussed potential treatment holiday given stability of her CT scan She will discuss with her husband We will proceed with treatment as scheduled and I will see her back in 3 weeks for further discussion about goals of care

## 2019-11-09 NOTE — Assessment & Plan Note (Signed)
She has poorly controlled diabetes She is not adherent to her diet this morning I will give her some insulin during treatment

## 2019-11-09 NOTE — Telephone Encounter (Signed)
Scheduled appts per 9/28 sch msg. Pt stated she would refer to mychart for AVS and appt details.

## 2019-11-09 NOTE — Assessment & Plan Note (Signed)
Observe only for now She is not a candidate for anticoagulation therapy

## 2019-11-09 NOTE — Patient Instructions (Signed)
[place] Discharge Instructions for Patients Receiving Chemotherapy  Today you received the following chemotherapy agent: docetaxel.  To help prevent nausea and vomiting after your treatment, we encourage you to take your nausea medication as directed.   If you develop nausea and vomiting that is not controlled by your nausea medication, call the clinic.   BELOW ARE SYMPTOMS THAT SHOULD BE REPORTED IMMEDIATELY:  *FEVER GREATER THAN 100.5 F  *CHILLS WITH OR WITHOUT FEVER  NAUSEA AND VOMITING THAT IS NOT CONTROLLED WITH YOUR NAUSEA MEDICATION  *UNUSUAL SHORTNESS OF BREATH  *UNUSUAL BRUISING OR BLEEDING  TENDERNESS IN MOUTH AND THROAT WITH OR WITHOUT PRESENCE OF ULCERS  *URINARY PROBLEMS  *BOWEL PROBLEMS  UNUSUAL RASH Items with * indicate a potential emergency and should be followed up as soon as possible.  Feel free to call the clinic should you have any questions or concerns. The clinic phone number is [phone].  Please show the [place] at check-in to the Emergency Department and triage nurse.

## 2019-11-09 NOTE — Assessment & Plan Note (Signed)
I have reviewed multiple imaging studies with the patient Olivia Benson has been on Taxotere since November of last year with stable disease control However, her treatment is complicated by pancytopenia, risk of bleeding, peripheral neuropathy and profound weight gain secondary to steroids therapy We discussed the risk and benefits of discontinuation of treatment but Olivia Benson is undecided We will proceed with treatment as scheduled and I will see her back in 3 weeks for further follow-up

## 2019-11-09 NOTE — Assessment & Plan Note (Signed)
She felt that her neuropathy is stable. For now, she will continue gabapentin and pain medicine as prescribed

## 2019-11-09 NOTE — Progress Notes (Signed)
[place] OFFICE PROGRESS NOTE  Patient Care Team: Gregor Hams, FNP as PCP - General (Family Medicine) Tat, Eustace Quail, DO as Consulting Physician (Neurology)  ASSESSMENT & PLAN:  Primary peritoneal carcinomatosis [facility]) I have reviewed multiple imaging studies with the patient She has been on Taxotere since November of last year with stable disease control However, her treatment is complicated by pancytopenia, risk of bleeding, peripheral neuropathy and profound weight gain secondary to steroids therapy We discussed the risk and benefits of discontinuation of treatment but she is undecided We will proceed with treatment as scheduled and I will see her back in 3 weeks for further follow-up  Pancytopenia, acquired ([place]) This is multifactorial, 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 For now, she will continue gabapentin and pain medicine as prescribed  Goals of care, counseling/discussion We have extensive discussions about goals of care Her disease is stable on treatment, however, has caused numerous different side effects We discussed potential treatment holiday given stability of her CT scan She will discuss with her husband We will proceed with treatment as scheduled and I will see her back in 3 weeks for further discussion about goals of care   No orders of the defined types were placed in this encounter.   All questions were answered. The patient  knows to call the clinic with any problems, questions or concerns. The total time spent in the appointment was 55 minutes encounter with patients including review of chart and various tests results, discussions about plan of care and coordination of care plan   [name], MD 11/09/2019 3:07 PM  INTERVAL HISTORY: Please see below for problem oriented charting. 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 Had allergic reaction to carboplatin. Avastin was stopped due to GI hemorrhage   Primary peritoneal carcinomatosis ([place])  09/06/2011 Imaging   CT findings consistent with cirrhotic changes involving the liver.  No worrisome liver mass.  There are associated portal venous collaterals and splenomegaly consistent with portal venous hypertension. 2.  No other significant upper abdominal findings   11/08/2013 Imaging   US showed ascites   11/08/2013 Initial Diagnosis   Patient had ~ 2 months of some urinary incontinence, saw Dr Ronita Hipps in 10-2013, with pelvic mass on exam    11/12/2013 Imaging   There are findings of progressive hepatic cirrhosis with a large amount of ascites and mild splenomegaly. There are venous collaterals present. 2. There is abnormal thickening of the peritoneal surface along the left mid and lower abdominal wall. There is abnormal soft tissue density in the pelvis as well which could reflect the clinically suspected ovarian malignancy but the findings are nondiagnostic. 3. There is a new small left pleural effusion. 4. There is no acute bowel abnormality.   11/18/2013  Imaging   Successful ultrasound guided diagnostic and therapeutic paracentesis yielding 4.1 liters of  ascites.    11/18/2013 Pathology Results   PERITONEAL/ASCITIC FLUID (SPECIMEN 1 OF 1 COLLECTED 11/18/13): SEROUS CARCINOMA, PLEASE SEE COMMENT.   12/01/2013 Tumor Marker   Patient's tumor was tested for the following markers: CA125 Results of the tumor marker test revealed 1938   12/03/2013 Imaging   Successful ultrasound-guided therapeutic paracentesis yielding 3.6 liters of peritoneal fluid.   12/07/2013 - 05/30/2014 Chemotherapy   She received 3 cycles of carboplatin and Taxol, interrupted cycle 4 for surgery.  She subsequently completed 3 more cycles of chemotherapy after surgery   12/20/2013 Imaging   Successful ultrasound-guided diagnostic and therapeutic paracentesis yielding 2.2 liters of peritoneal fluid   12/20/2013 Pathology Results   PERITONEAL/ASCITIC FLUID(SPECIMEN 1 OF 1 COLLECTED 12/20/13): MALIGNANT CELLS CONSISTENT WITH SEROUS CARCINOMA   01/13/2014 Tumor Marker   Patient's tumor was tested for the following markers: CA125 Results of the tumor marker test revealed 227   02/07/2014 Tumor Marker   Patient's tumor was tested for the following markers: CA125 Results of the tumor marker test revealed 130   02/15/2014 Genetic Testing   Genetics testing from 02-2014 normal ([employer] panel)   02/23/2014 Imaging   Interval decrease in ascites. 2. Morphologic changes in the liver consistent with cirrhosis. 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 Portal vein remains patent at this time. 3. Persistent but decreased abnormal soft tissue attenuation tracking in the omental fat. This may be secondary to interval improvement in metastatic disease. 4. Peritoneal thickening seen in the left abdomen and pelvis on the previous study has decreased and nearly resolved in the interval. This also suggests interval improvement in metastatic disease.    03/07/2014 Procedure   Ultrasound and fluoroscopically guided right internal jugular single lumen power  port catheter insertion. Tip in the SVC/RA junction. Catheter ready for use.   03/17/2014 Tumor Marker   Patient's tumor was tested for the following markers: CA125 Results of the tumor marker test revealed 45   03/29/2014 Pathology Results   1. Omentum, resection for tumor - HIGH GRADE SEROUS CARCINOMA, SEE COMMENT. 2. Ovary and fallopian tube, right - HIGH GRADE SEROUS CARCINOMA, SEE COMMENT. 3. Ovary and fallopian tube, left - HIGH GRADE SEROUS CARCINOMA, SEE COMMENT. Diagnosis Note 1. Nests and clusters of malignant cells are invading the omental tissue (part #1) with associated fibrosis. The cells are pleomorphic with prominent nucleoli. There are scattered psammoma bodies. The ovaries are atrophic and exhibit multiple foci of surface based invasive carcinoma and associated fibrosis. The fallopian tubes have a few foci of carcinoma, also superficially located. There are no precursor lesions noted in the ovaries or fallopian tubes (the entire tubes and ovaries were submitted for evaluation). While there is some retraction artifact, there are several foci suspicious for lymphovascular invasion. 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 Given the fibrosis, there does appear to be a small amount of treatment effect, however, there is abundant residual tumor.    03/29/2014 Surgery   Procedure(s) Performed: 1. Exploratory laparotomy with bilateral salpingo-oophorectomy, omentectomy radical tumor debulking for ovarian cancer .  Surgeon: Thereasa Solo, MD.  Assistant Surgeon: Lahoma Crocker, M.D. Assistant: (an MD assistant was necessary for tissue manipulation, retraction and positioning due to the complexity of the case and hospital policies).  Operative Findings: 10cm omental  cake from hepatic to splenic flexure, densely adherent to transverse colon. Milliary  studding of tumor implants (<82m, too numerous in number to count) adherent to the mesentery of the small bowel and small bowel wall. Small volume (100cc) ascites. Small ovaries bilaterally, densely adherent to the pelvic cul de sac. Left ovary and tube densely adherent to sigmoid colon. Cirrhotic liver with hepatomegaly and splenomegaly.     This represented an optimal cytoreduction (R1) with visible disease residual on the bowel wall and mesentery (millial, <356mimplants).     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 2. 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    08/18/2014 Tumor Marker   Patient's tumor was tested for the following markers: CA125 Results of the tumor marker test revealed 16   10/13/2014 Tumor Marker   Patient's tumor was tested for the following markers: CA125 Results of the tumor marker test revealed 14   11/21/2014 Tumor Marker   Patient's tumor was tested for the following markers: CA125 Results of the tumor marker test revealed 16   12/28/2014 Tumor Marker   Patient's tumor was tested for the following markers: CA125 Results of the tumor marker test revealed 762   02/27/2015 Tumor Marker   Patient's tumor was tested for the following markers: CA125 Results of the tumor marker test revealed 20.2   03/22/2015 Imaging   Filler appearance to the prior exam, with very subtle fluid tracking along portions of the liver, and very minimal nodularity along the right paracolic gutter  representing residua from the prior peritoneal cancer. The current abnormalities could simply be post therapy findings rather than necessarily representing residual malignancy. No new or enlarging lesions are identified. 2. Hepatic cirrhosis and splenomegaly. There is some gastric varices suggesting portal venous hypertension. 3. Left foraminal stenosis at L4-5 due to spurring. There is likely also some central narrowing of the thecal sac at this level. 4. Bibasilar scarring. 5. Chronic mild left mid kidney scarring   03/27/2015 Tumor Marker   Patient's tumor was tested for the following markers: CA125 Results of the tumor marker test revealed 25.3   04/24/2015 Imaging   Disc bulge L2-3 with mild spinal stenosis and right foraminal encroachment. 2. Disc bulge L3-4 with mild spinal stenosis. 3. Multifactorial spinal, left lateral recess and foraminal stenosis L4-5. There is grade 1 anterolisthesis without evident dynamic instability.   05/29/2015 Tumor Marker   Patient's tumor was tested for the following markers: CA125 Results of the tumor marker test revealed 29.9   08/21/2015 Tumor Marker   Patient's tumor was tested for the following markers: CA125 Results of the tumor marker test revealed 45   09/18/2015 Tumor Marker   Patient's tumor was tested for the following markers: CA125 Results of the tumor marker test revealed 47   09/27/2015 Imaging   New small bowel mesenteric nodule and enlarged left external iliac lymph node, highly worrisome for metastatic disease. 2. Cirrhosis and splenomegaly   10/10/2015 Tumor Marker   Patient's tumor was tested for the  following markers: CA125 Results of the tumor marker test revealed 53.4   10/10/2015 - 12/11/2015 Chemotherapy   She received 4 cycles of carboplatin only   11/20/2015 Tumor Marker   Patient's tumor was tested for the following markers: CA125 Results of the tumor marker test revealed 41.3   12/20/2015 Imaging   CT: Mild left external  iliac lymphadenopathy is slightly decreased. Mildly enlarged lower mesenteric nodule is slightly decreased. 2. No new or progressive metastatic disease in the abdomen or pelvis. 3. Cirrhosis. Stable subcentimeter hypodense left liver lobe lesion. No new liver lesions. 4. Stable mild splenomegaly.  No ascites. 5. Mild sigmoid diverticulosis.   01/01/2016 -  Anti-estrogen oral therapy   She was placed on tamoxifen. Had letrozole initially but switched to tamoxifen due to poor tolerance   01/15/2016 Tumor Marker   Patient's tumor was tested for the following markers: CA125 Results of the tumor marker test revealed 31.4   02/19/2016 Tumor Marker   Patient's tumor was tested for the following markers: CA125 Results of the tumor marker test revealed 36.5   05/13/2016 Imaging   No evidence of disease progression within the abdomen or pelvis. Previously noted small central mesenteric nodule and left external iliac node appear slightly smaller. 2. Stable changes of hepatic cirrhosis and portal hypertension with associated splenomegaly. No new or enlarging hepatic lesions are identified. 3. No acute findings.   05/13/2016 Tumor Marker   Patient's tumor was tested for the following markers: CA125 Results of the tumor marker test revealed 41.2   06/24/2016 Tumor Marker   Patient's tumor was tested for the following markers: CA125 Results of the tumor marker test revealed 47.3   08/20/2016 Imaging   1. The left external iliac lymph node is slightly increased in size compared to the prior exam, currently 1.1 cm and previously 0.9 cm in diameter. 2. Stable central mesenteric lymph node at 0.9 cm in diameter. 3. Stable trace free pelvic fluid and stable slight thickening along the right paracolic gutter, without well-defined peritoneal nodularity. 4. Other imaging findings of potential clinical significance: Subsegmental atelectasis or scarring in the lung bases. Hepatic cirrhosis. Left renal scarring. Mild  splenomegaly. Sigmoid colon diverticulosis. Impingement at L4-5 due to spondylosis and degenerative disc disease broad Schmorl' s nodes at L3-L4. Pelvic floor laxity   08/23/2016 Tumor Marker   Patient's tumor was tested for the following markers: CA125 Results of the tumor marker test revealed 80.2   09/04/2016 Imaging   LV EF: 60% -   65%   09/12/2016 Tumor Marker   Patient's tumor was tested for the following markers: CA125 Results of the tumor marker test revealed 98.5   09/12/2016 - 01/29/2018 Chemotherapy   The patient received Doxil and Avastin. Avastin was discontinued in Dec 2019 due to GI hemorrhage   09/26/2016 Tumor Marker   Patient's tumor was tested for the following markers: CA125 Results of the tumor marker test revealed 68.9   10/10/2016 Tumor Marker   Patient's tumor was tested for the following markers: CA125 Results of the tumor marker test revealed 65.6   11/07/2016 Tumor Marker   Patient's tumor was tested for the following markers: CA125 Results of the tumor marker test revealed 46.4   11/29/2016 Imaging   Stable mild peritoneal thickening, suspicious for peritoneal carcinomatosis. No new or progressive disease identified. No evidence of ascites.  No significant change in 10 mm left external iliac and 8 mm mesenteric lymph nodes.  Stable hepatic cirrhosis, and splenomegaly consistent with portal  venous hypertension.  Colonic diverticulosis, without radiographic evidence of diverticulitis.   12/02/2016 Imaging   Normal LV size with EF 55%. Basal inferior and basal inferoseptal hypokinesis. Normal RV size and systolic function. No significant valvular abnormalities.   12/05/2016 Tumor Marker   Patient's tumor was tested for the following markers: CA125 Results of the tumor marker test revealed 49.1   01/09/2017 Tumor Marker   Patient's tumor was tested for the following markers: CA125 Results of the tumor marker test revealed 47.2   02/06/2017 Tumor Marker    Patient's tumor was tested for the following markers: CA125 Results of the tumor marker test revealed 44.1   02/18/2017 Imaging   Stable mild peritoneal thickening. No new or progressive disease identified within the abdomen or pelvis.  Stable tiny sub-cm left external iliac and mesenteric lymph nodes.  Stable hepatic cirrhosis, and splenomegaly consistent with portal venous hypertension. No evidence of hepatic neoplasm.  Colonic diverticulosis, without radiographic evidence of diverticulitis.   03/06/2017 Tumor Marker   Patient's tumor was tested for the following markers: CA125 Results of the tumor marker test revealed 50.4   03/17/2017 Imaging   - Left ventricle: The cavity size was normal. Wall thickness was normal. Systolic function was normal. The estimated ejection fraction was in the range of 55% to 60%. Wall motion was normal; there were no regional wall motion abnormalities. Features are consistent with a pseudonormal left ventricular filling pattern, with concomitant abnormal relaxation and increased filling pressure (grade 2 diastolic dysfunction). - Mitral valve: There was mild regurgitation. - Left atrium: The atrium was mildly dilated   04/03/2017 Tumor Marker   Patient's tumor was tested for the following markers: CA125 Results of the tumor marker test revealed 47.2   05/14/2017 Imaging   CT scan of abdomen and pelvis Stable mild peritoneal thickening. No new or progressive disease within the abdomen or pelvis.  Stable hepatic cirrhosis. Stable splenomegaly, consistent with portal venous hypertension. No evidence of hepatic neoplasm.   05/15/2017 Tumor Marker   Patient's tumor was tested for the following markers: CA125 Results of the tumor marker test revealed 56.1   06/12/2017 Tumor Marker   Patient's tumor was tested for the following markers: CA125 Results of the tumor marker test revealed 49.2   07/10/2017 Tumor Marker   Patient's tumor was tested for the following  markers: CA125 Results of the tumor marker test revealed 46.3   08/07/2017 Tumor Marker   Patient's tumor was tested for the following markers: CA125 Results of the tumor marker test revealed 52.9   08/20/2017 Imaging   1. Mild omental/peritoneal haziness, unchanged. No evidence of new metastatic disease. 2. Cirrhosis with splenomegaly.   09/04/2017 Tumor Marker   Patient's tumor was tested for the following markers: CA125 Results of the tumor marker test revealed 48.5   11/13/2017 Tumor Marker   Patient's tumor was tested for the following markers: CA125 Results of the tumor marker test revealed 55.8   12/17/2017 Imaging   1. No findings to suggest metastatic disease in the abdomen or pelvis. 2. Severe hepatic cirrhosis with evidence of portal hypertension, as demonstrated by dilated portal vein, splenomegaly and portosystemic collateral pathways, including gastric and esophageal varices.  3. Colonic diverticulosis without evidence of acute diverticulitis at this time. 4. Additional incidental findings, as above.   01/16/2018 Tumor Marker   Patient's tumor was tested for the following markers: CA125 Results of the tumor marker test revealed 63.7   02/10/2018 Procedure   EGD - Recently bleeding  grade III and large (> 5 mm) esophageal varices. Completely eradicated. Banded. - Portal hypertensive gastropathy. - Normal examined duodenum. - No specimens collected   02/13/2018 Tumor Marker   Patient's tumor was tested for the following markers: CA125 Results of the tumor marker test revealed 82.7   02/25/2018 Imaging   Bone density showed mild osteopenia   04/08/2018 Tumor Marker   Patient's tumor was tested for the following markers: CA125 Results of the tumor marker test revealed 82.7   04/08/2018 Imaging   1. No definite omental or peritoneal surface lesions. However, there are 2 slowly enlarging lymph nodes noted. One is in the small bowel mesentery and the other is in the left  deep pelvis. Could not exclude recurrent disease. 2. Stable advanced cirrhotic changes involving the liver with portal venous hypertension, portal venous collaterals, esophageal varices and splenomegaly. No worrisome hepatic lesions. 3. Diffuse wall thickening of the colon could suggest diffuse colitis or could be due to low albumin. Recommend correlation with any symptoms such as diarrhea.   05/21/2018 Tumor Marker   Patient's tumor was tested for the following markers: CA125 Results of the tumor marker test revealed 86   09/07/2018 Tumor Marker   Patient's tumor was tested for the following markers:CA-125 Results of the tumor marker test revealed 111   09/29/2018 Tumor Marker   Patient's tumor was tested for the following markers: CA-125 Results of the tumor marker test revealed 121.   09/29/2018 - 11/24/2018 Chemotherapy   The patient had carboplatin and gemzar for chemotherapy treatment.  Chemo was stopped due to allergic reaction to carboplatin   10/27/2018 Tumor Marker   Patient's tumor was tested for the following markers: CA-125 Results of the tumor marker test revealed 99.9   11/24/2018 Tumor Marker   Patient's tumor was tested for the following markers: CA-125 Results of the tumor marker test revealed 81.2   12/14/2018 Tumor Marker   Patient's tumor was tested for the following markers: CA-125 Results of the tumor marker test revealed 81.2   12/14/2018 Imaging   1. Mild improvement in peritoneal disease. Peritoneal nodule within the left posterior pelvis contiguous with the left side of vaginal cuff is slightly decreased in size in the interval. Within the right iliac fossa there is a small peritoneal nodule which has decreased in size in the interval. Central small bowel mesenteric lymph node is not significantly changed. Slight decrease in size left periaortic lymph node.    2. Morphologic features of the liver compatible with cirrhosis. Splenomegaly is noted which may reflect  portal venous hypertension.   12/21/2018 Tumor Marker   Patient's tumor was tested for the following markers: CA-125 Results of the tumor marker test revealed 64.2   12/21/2018 -  Chemotherapy   The patient had taxotere for chemotherapy treatment.     03/02/2019 Tumor Marker   Patient's tumor was tested for the following markers: CA-125. 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   No new or progressive metastatic disease within the abdomen or pelvis.   Hepatic cirrhosis and findings of portal venous hypertension. 1.1 cm low-attenuation lesion within left hepatic lobe, which could represent a regenerative nodule or small hepatocellular carcinoma. Recommend abdomen MRI without and with contrast for further characterization.     04/13/2019 Tumor Marker   Patient's tumor was tested for the following markers: CA-125 Results of the tumor marker test revealed 36.8  05/26/2019 Tumor Marker   Patient's tumor was tested for the following markers: CA-125 Results of the tumor marker test revealed 31.6.   06/15/2019 Tumor Marker   Patient's tumor was tested for the following markers: CA-125 Results of the tumor marker test revealed 25.4   07/05/2019 Imaging   1. Stable CT examination. Stable peritoneal thickening in the pelvis and paracolic gutters. Stable solid left deep pelvic implant. Stable trace ascites. No new or progressive metastatic disease. 2. Stable cirrhosis. No discrete liver masses. 3. Stable mild-to-moderate splenomegaly. 4. Aortic Atherosclerosis (ICD10-I70.0).   07/27/2019 Tumor Marker   Patient's tumor was tested for the following markers: CA-125 Results of the tumor marker test revealed 35.4   08/17/2019 Tumor Marker   Patient's tumor was tested for the following markers: Ca-125 Results of the tumor marker test revealed 35.7.   09/17/2019 - 09/21/2019 Hospital Admission   She  was admitted due to esophageal variceal bleeding presents with hematemesis and melanotic stool with epigastric pain.  GI consulted, started on octreotide infusion.  Underwent EGD which showed minimal bleeding, remain on octreotide drip, PPI drip.  Hemoglobin trended down therefore received PRBC transfusion.  Over several days her bleeding subsided, hemodynamically remained stable.    09/17/2019 Procedure   EGD report - Esophageal mucosal scarring, from prior esophageal banding sessions. - Grade I esophageal varices. - Red blood in the cardia and in the gastric fundus. - Portal hypertensive gastropathy. - Normal duodenal bulb, first portion of the duodenum and second portion of the duodenum. 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 2. Otherwise stable CT exam. Stable peritoneal thickening within the bilateral deep pelvic peritoneum. Stable left deep pelvic implant.No evidence for progressed disease. 3. Stable splenomegaly. 4. Morphologic changes to the liver compatible with cirrhosis. Trace perihepatic and pelvic ascites.     REVIEW OF SYSTEMS:   Constitutional: Denies fevers, chills or abnormal weight loss Eyes: Denies blurriness of vision Ears, nose, mouth, throat, and face: Denies mucositis or sore throat Respiratory: Denies cough, dyspnea or wheezes Cardiovascular: Denies palpitation, chest discomfort or lower extremity swelling Gastrointestinal:  Denies nausea, heartburn or change in bowel habits Skin: Denies abnormal skin rashes Lymphatics: Denies new lymphadenopathy or easy  bruising Behavioral/Psych: Mood is stable, no new changes  All other systems were reviewed with the patient and are negative.  I have reviewed the past medical history, past surgical history, social history and family history with the patient and they are unchanged from previous note.  ALLERGIES:  is allergic to carboplatin, shellfish allergy, vancomycin, benadryl [diphenhydramine hcl], and penicillins.  MEDICATIONS:  Current Outpatient Medications  Medication Sig Dispense Refill  . cholecalciferol (VITAMIN D) 1000 units tablet Take 1,000 Units by mouth at bedtime.     Marland Kitchen dexamethasone (DECADRON) 4 MG tablet Start the day before Taxotere, take 1 tablet in the morning, and then 1 tablet daily after chemo for 2 days. Pls dispense 30 tabs (Patient taking differently: Take 4 mg by mouth See admin instructions. Start the day before Taxotere, take 1 tablet in the morning, and then 1 tablet daily after chemo for 2 days. Pls dispense 30 tabs) 30 tablet 1  . gabapentin (NEURONTIN)  300 MG capsule TAKE 3 CAPSULES(900 MG) BY MOUTH TWICE DAILY 120 capsule 11  . HUMALOG MIX 75/25 KWIKPEN (75-25) 100 UNIT/ML Kwikpen Inject 22 Units into the skin 2 (two) times daily. Before breakfast & before supper Except when taking Chemo take 32 units  For three to five days    . HYDROcodone-acetaminophen (NORCO/VICODIN) 5-325 MG tablet Take 1 tablet by mouth every 6 (six) hours as needed for moderate pain. 60 tablet 0  . lidocaine-prilocaine (EMLA) cream Apply to Porta-cath  1-2 hours prior to access as directed. (Patient taking differently: Apply 1 application topically daily as needed (port access). ) 30 g 9  . nadolol (CORGARD) 20 MG tablet Take 1 tablet (20 mg total) by mouth daily. (Patient taking differently: Take 20 mg by mouth at bedtime. ) 90 tablet 3  . ondansetron (ZOFRAN) 8 MG tablet Take 1 tablet (8 mg total) by mouth every 8 (eight) hours as needed. Start on the third day after chemotherapy. (Patient taking  differently: Take 8 mg by mouth every 8 (eight) hours as needed for nausea. Start on the third day after chemotherapy.) 30 tablet 1  . pantoprazole (PROTONIX) 40 MG tablet Take 1 tablet (40 mg total) by mouth daily.    Marland Kitchen PROAIR HFA 108 (90 Base) MCG/ACT inhaler Inhale 2 puffs into the lungs every 4 (four) hours as needed for wheezing or shortness of breath.     . prochlorperazine (COMPAZINE) 10 MG tablet Take 1 tablet (10 mg total) by mouth every 6 (six) hours as needed (Nausea or vomiting). 30 tablet 1  . rotigotine (NEUPRO) 4 MG/24HR APPLY 1 PATCH EXTERNALLY TO THE SKIN DAILY 30 patch 6   No current facility-administered medications for this visit.   Facility-Administered Medications Ordered in Other Visits  Medication Dose Route Frequency Provider Last Rate Last Admin  . heparin lock flush 100 unit/mL  500 Units Intracatheter Once PRN Alvy Bimler, Jessey Stehlin, MD      . sodium chloride flush (NS) 0.9 % injection 10 mL  10 mL Intracatheter PRN Alvy Bimler, Meghan Tiemann, MD        PHYSICAL EXAMINATION: ECOG PERFORMANCE STATUS: 1 - Symptomatic but completely ambulatory  Vitals:   11/09/19 1015  BP: (!) 147/59  Pulse: 73  Resp: 18  Temp: (!) 97.2 F (36.2 C)  SpO2: 99%   Filed Weights   11/09/19 1015  Weight: 197 lb 12.8 oz (89.7 kg)    GENERAL:alert, no distress and comfortable NEURO: alert & oriented x 3 with fluent speech, no focal motor/sensory deficits  LABORATORY DATA:  I have reviewed the data as listed    Component Value Date/Time   NA 139 11/09/2019 0953   NA 142 02/06/2017 0742   K 3.7 11/09/2019 0953   K 3.9 02/06/2017 0742   CL 109 11/09/2019 0953   CO2 25 11/09/2019 0953   CO2 22 02/06/2017 0742   GLUCOSE 325 (H) 11/09/2019 0953   GLUCOSE 156 (H) 02/06/2017 0742   BUN 16 11/09/2019 0953   BUN 13.5 02/06/2017 0742   CREATININE 0.94 11/09/2019 0953   CREATININE 0.85 10/26/2019 0722   CREATININE 0.8 02/06/2017 0742   CALCIUM 8.5 (L) 11/09/2019 0953   CALCIUM 8.5 02/06/2017 0742    PROT 7.0 11/09/2019 0953   PROT 6.9 02/06/2017 0742   ALBUMIN 3.4 (L) 11/09/2019 0953   ALBUMIN 3.7 02/06/2017 0742   AST 18 11/09/2019 0953   AST 24 10/26/2019 0722   AST 47 (H) 02/06/2017 0742   ALT 19 11/09/2019  0953   ALT 17 10/26/2019 0722   ALT 54 02/06/2017 0742   ALKPHOS 130 (H) 11/09/2019 0953   ALKPHOS 130 02/06/2017 0742   BILITOT 0.6 11/09/2019 0953   BILITOT 0.7 10/26/2019 0722   BILITOT 0.65 02/06/2017 0742   GFRNONAA >60 11/09/2019 0953   GFRNONAA >60 10/26/2019 0722   GFRAA >60 11/09/2019 0953   GFRAA >60 10/26/2019 0722    No results found for: SPEP, UPEP  Lab Results  Component Value Date   WBC 3.5 (L) 11/09/2019   NEUTROABS 2.5 11/09/2019   HGB 9.8 (L) 11/09/2019   HCT 31.2 (L) 11/09/2019   MCV 89.1 11/09/2019   PLT 98 (L) 11/09/2019      Chemistry      Component Value Date/Time   NA 139 11/09/2019 0953   NA 142 02/06/2017 0742   K 3.7 11/09/2019 0953   K 3.9 02/06/2017 0742   CL 109 11/09/2019 0953   CO2 25 11/09/2019 0953   CO2 22 02/06/2017 0742   BUN 16 11/09/2019 0953   BUN 13.5 02/06/2017 0742   CREATININE 0.94 11/09/2019 0953   CREATININE 0.85 10/26/2019 0722   CREATININE 0.8 02/06/2017 0742      Component Value Date/Time   CALCIUM 8.5 (L) 11/09/2019 0953   CALCIUM 8.5 02/06/2017 0742   ALKPHOS 130 (H) 11/09/2019 0953   ALKPHOS 130 02/06/2017 0742   AST 18 11/09/2019 0953   AST 24 10/26/2019 0722   AST 47 (H) 02/06/2017 0742   ALT 19 11/09/2019 0953   ALT 17 10/26/2019 0722   ALT 54 02/06/2017 0742   BILITOT 0.6 11/09/2019 0953   BILITOT 0.7 10/26/2019 0722   BILITOT 0.65 02/06/2017 0742       RADIOGRAPHIC STUDIES: I have reviewed multiple imaging studies with the patient I have personally reviewed the radiological images as listed and agreed with the findings in the report. CT ABDOMEN PELVIS W CONTRAST  Result Date: 11/06/2019 CLINICAL DATA:  Patient with history of ovarian carcinoma diagnosed in 2015. Restarting  chemotherapy. Restaging exam. EXAM: CT ABDOMEN AND PELVIS WITH CONTRAST TECHNIQUE: Multidetector CT imaging of the abdomen and pelvis was performed using the standard protocol following bolus administration of intravenous contrast. CONTRAST:  140m OMNIPAQUE IOHEXOL 300 MG/ML  SOLN COMPARISON:  CT abdomen pelvis Jul 05, 2019 FINDINGS: Lower chest: Normal heart size. Stable atelectasis/scarring within the lower lobes bilaterally. No pleural effusion. Hepatobiliary: Liver is nodular in contour. No focal hepatic lesion identified. Gallbladder is unremarkable. Pancreas: Unremarkable Spleen: Redemonstrated splenomegaly measuring 15.6 cm, stable. Adrenals/Urinary Tract: Normal adrenal glands. Atrophic left kidney. No hydronephrosis. Urinary bladder is unremarkable. Stomach/Bowel: Normal morphology of the stomach. No evidence for small bowel obstruction. No free intraperitoneal air. Vascular/Lymphatic: Normal caliber abdominal aorta. Similar-appearing 6 mm aortocaval node (image 33; series 2). Similar-appearing 7 mm left periaortic (image 45; series 2). No pathologically enlarged retroperitoneal or pelvic nodes. Small amount of nonocclusive thrombus within the central aspect of the superior mesenteric vein at the junction with the splenic vein (image 32; series 2). Reproductive: Patient status post hysterectomy. Stable thickening of the bilateral deep pelvic peritoneum. Similar-appearing 1.6 x 1.1 cm the pelvic implant (image 72; series 2). Other: No fluid collection identified. Trace perihepatic and pelvic ascites. Stable peritoneal thickening pericolic gutters bilaterally. Musculoskeletal: Lower thoracic and lumbar spine degenerative changes. No aggressive or acute appearing osseous lesions. Similar-appearing chronic superior L3 endplate deformity. IMPRESSION: 1. Small amount of nonocclusive thrombus demonstrated at the central aspect of the superior mesenteric  vein. 2. Otherwise stable CT exam. Stable peritoneal  thickening within the bilateral deep pelvic peritoneum. Stable left deep pelvic implant. No evidence for progressed disease. 3. Stable splenomegaly. 4. Morphologic changes to the liver compatible with cirrhosis. Trace perihepatic and pelvic ascites. 5. These results will be called to the ordering clinician or representative by the Radiologist Assistant, and communication documented in the PACS or [employer]. Electronically Signed   By: Lovey Newcomer M.D.   On: 11/06/2019 15:37

## 2019-12-02 ENCOUNTER — Other Ambulatory Visit: Payer: Self-pay

## 2019-12-02 ENCOUNTER — Other Ambulatory Visit: Payer: Self-pay | Admitting: Hematology and Oncology

## 2019-12-02 ENCOUNTER — Inpatient Hospital Stay (HOSPITAL_BASED_OUTPATIENT_CLINIC_OR_DEPARTMENT_OTHER): Payer: Medicare Other | Admitting: Hematology and Oncology

## 2019-12-02 ENCOUNTER — Inpatient Hospital Stay: Payer: Medicare Other

## 2019-12-02 ENCOUNTER — Encounter: Payer: Self-pay | Admitting: Hematology and Oncology

## 2019-12-02 ENCOUNTER — Inpatient Hospital Stay: Payer: Medicare Other | Attending: Hematology and Oncology

## 2019-12-02 VITALS — BP 135/55 | Temp 98.2°F | Resp 16

## 2019-12-02 DIAGNOSIS — I700 Atherosclerosis of aorta: Secondary | ICD-10-CM | POA: Insufficient documentation

## 2019-12-02 DIAGNOSIS — E1140 Type 2 diabetes mellitus with diabetic neuropathy, unspecified: Secondary | ICD-10-CM

## 2019-12-02 DIAGNOSIS — D509 Iron deficiency anemia, unspecified: Secondary | ICD-10-CM

## 2019-12-02 DIAGNOSIS — E1165 Type 2 diabetes mellitus with hyperglycemia: Secondary | ICD-10-CM | POA: Diagnosis not present

## 2019-12-02 DIAGNOSIS — D61818 Other pancytopenia: Secondary | ICD-10-CM | POA: Insufficient documentation

## 2019-12-02 DIAGNOSIS — Z9221 Personal history of antineoplastic chemotherapy: Secondary | ICD-10-CM | POA: Insufficient documentation

## 2019-12-02 DIAGNOSIS — Z7189 Other specified counseling: Secondary | ICD-10-CM

## 2019-12-02 DIAGNOSIS — C482 Malignant neoplasm of peritoneum, unspecified: Secondary | ICD-10-CM

## 2019-12-02 DIAGNOSIS — C569 Malignant neoplasm of unspecified ovary: Secondary | ICD-10-CM

## 2019-12-02 DIAGNOSIS — Z5189 Encounter for other specified aftercare: Secondary | ICD-10-CM | POA: Insufficient documentation

## 2019-12-02 DIAGNOSIS — Z5111 Encounter for antineoplastic chemotherapy: Secondary | ICD-10-CM | POA: Diagnosis not present

## 2019-12-02 DIAGNOSIS — Z79899 Other long term (current) drug therapy: Secondary | ICD-10-CM | POA: Diagnosis not present

## 2019-12-02 DIAGNOSIS — Z794 Long term (current) use of insulin: Secondary | ICD-10-CM

## 2019-12-02 LAB — CBC WITH DIFFERENTIAL/PLATELET
Abs Immature Granulocytes: 0.02 10*3/uL (ref 0.00–0.07)
Basophils Absolute: 0 10*3/uL (ref 0.0–0.1)
Basophils Relative: 0 %
Eosinophils Absolute: 0 10*3/uL (ref 0.0–0.5)
Eosinophils Relative: 1 %
HCT: 34.5 % — ABNORMAL LOW (ref 36.0–46.0)
Hemoglobin: 11 g/dL — ABNORMAL LOW (ref 12.0–15.0)
Immature Granulocytes: 0 %
Lymphocytes Relative: 19 %
Lymphs Abs: 1.1 10*3/uL (ref 0.7–4.0)
MCH: 28.9 pg (ref 26.0–34.0)
MCHC: 31.9 g/dL (ref 30.0–36.0)
MCV: 90.8 fL (ref 80.0–100.0)
Monocytes Absolute: 0.4 10*3/uL (ref 0.1–1.0)
Monocytes Relative: 7 %
Neutro Abs: 4.4 10*3/uL (ref 1.7–7.7)
Neutrophils Relative %: 73 %
Platelets: 111 10*3/uL — ABNORMAL LOW (ref 150–400)
RBC: 3.8 MIL/uL — ABNORMAL LOW (ref 3.87–5.11)
RDW: 17.7 % — ABNORMAL HIGH (ref 11.5–15.5)
WBC: 6 10*3/uL (ref 4.0–10.5)
nRBC: 0 % (ref 0.0–0.2)

## 2019-12-02 LAB — COMPREHENSIVE METABOLIC PANEL
ALT: 23 U/L (ref 0–44)
AST: 22 U/L (ref 15–41)
Albumin: 3.7 g/dL (ref 3.5–5.0)
Alkaline Phosphatase: 124 U/L (ref 38–126)
Anion gap: 8 (ref 5–15)
BUN: 18 mg/dL (ref 8–23)
CO2: 25 mmol/L (ref 22–32)
Calcium: 9 mg/dL (ref 8.9–10.3)
Chloride: 110 mmol/L (ref 98–111)
Creatinine, Ser: 0.78 mg/dL (ref 0.44–1.00)
GFR, Estimated: >60 mL/min (ref >60)
Glucose, Bld: 120 mg/dL — ABNORMAL HIGH (ref 70–99)
Potassium: 3.8 mmol/L (ref 3.5–5.1)
Sodium: 143 mmol/L (ref 135–145)
Total Bilirubin: 0.5 mg/dL (ref 0.3–1.2)
Total Protein: 7.2 g/dL (ref 6.5–8.1)

## 2019-12-02 LAB — FERRITIN: Ferritin: 124 ng/mL (ref 11–307)

## 2019-12-02 LAB — IRON AND TIBC
Iron: 70 ug/dL (ref 41–142)
Saturation Ratios: 21 % (ref 21–57)
TIBC: 336 ug/dL (ref 236–444)
UIBC: 266 ug/dL (ref 120–384)

## 2019-12-02 MED ORDER — HEPARIN SOD (PORK) LOCK FLUSH 100 UNIT/ML IV SOLN
500.0000 [IU] | Freq: Once | INTRAVENOUS | Status: AC | PRN
  Administered 2019-12-02: 500 [IU]
  Filled 2019-12-02: qty 5

## 2019-12-02 MED ORDER — SODIUM CHLORIDE 0.9 % IV SOLN
37.5000 mg/m2 | Freq: Once | INTRAVENOUS | Status: AC
  Administered 2019-12-02: 70 mg via INTRAVENOUS
  Filled 2019-12-02: qty 7

## 2019-12-02 MED ORDER — SODIUM CHLORIDE 0.9% FLUSH
10.0000 mL | INTRAVENOUS | Status: DC | PRN
  Administered 2019-12-02: 10 mL
  Filled 2019-12-02: qty 10

## 2019-12-02 MED ORDER — SODIUM CHLORIDE 0.9% FLUSH
10.0000 mL | Freq: Once | INTRAVENOUS | Status: AC
  Administered 2019-12-02: 10 mL
  Filled 2019-12-02: qty 10

## 2019-12-02 MED ORDER — PEGFILGRASTIM 6 MG/0.6ML ~~LOC~~ PSKT
PREFILLED_SYRINGE | SUBCUTANEOUS | Status: AC
  Filled 2019-12-02: qty 0.6

## 2019-12-02 MED ORDER — SODIUM CHLORIDE 0.9 % IV SOLN
510.0000 mg | Freq: Once | INTRAVENOUS | Status: AC
  Administered 2019-12-02: 510 mg via INTRAVENOUS
  Filled 2019-12-02: qty 510

## 2019-12-02 MED ORDER — DEXAMETHASONE SODIUM PHOSPHATE 10 MG/ML IJ SOLN
5.0000 mg | Freq: Once | INTRAMUSCULAR | Status: AC
  Administered 2019-12-02: 5 mg via INTRAVENOUS

## 2019-12-02 MED ORDER — PEGFILGRASTIM 6 MG/0.6ML ~~LOC~~ PSKT
6.0000 mg | PREFILLED_SYRINGE | Freq: Once | SUBCUTANEOUS | Status: AC
  Administered 2019-12-02: 6 mg via SUBCUTANEOUS

## 2019-12-02 MED ORDER — SODIUM CHLORIDE 0.9 % IV SOLN
Freq: Once | INTRAVENOUS | Status: AC
  Filled 2019-12-02: qty 250

## 2019-12-02 MED ORDER — HYDROCODONE-ACETAMINOPHEN 5-325 MG PO TABS: 1.0000 | ORAL_TABLET | Freq: Four times a day (QID) | ORAL | 0 refills | Status: DC | PRN

## 2019-12-02 MED ORDER — DEXAMETHASONE SODIUM PHOSPHATE 10 MG/ML IJ SOLN
INTRAMUSCULAR | Status: AC
  Filled 2019-12-02: qty 1

## 2019-12-02 NOTE — Progress Notes (Signed)
[place] OFFICE PROGRESS NOTE  Patient Care Team: Gregor Hams, FNP as PCP - General (Family Medicine) Tat, Eustace Quail, DO as Consulting Physician (Neurology)  ASSESSMENT & PLAN:  Primary peritoneal carcinomatosis [facility]) I have reviewed the plan of care with the patient and her husband again today We discussed the risk, benefits, side effects of continuing treatment versus stopping Given stability of her CT imaging in significant toxicities from the chemotherapy, ultimately, she is in agreement to proceed with 1 more dose today and then take a break I plan to see her at the end of November for further follow-up, review of blood count and possible further intravenous iron infusion  Pancytopenia, acquired ([place]) This is multifactorial, 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, recent bleeding and chemotherapy Observe only for now Plan for IV iron today No need to treat thrombocytopenia  Iron deficiency anemia She has multifactorial anemia, with component of iron deficiency I plan to schedule intravenous iron infusion again today Due to her history of intermittent variceal bleed, I would like to try to normalize her hemoglobin and ferritin at least 100  Diabetes mellitus with diabetic neuropathy, with long-term current use of insulin ([place]) She has history of poorly controlled diabetes She has some recent follow-up with her endocrinologist with changes of her regimen Her blood sugar control today is much improved  Goals of care, counseling/discussion We have extensive discussions about goals of care Her disease is stable on treatment, however, has caused numerous different side effects We discussed potential treatment holiday given stability of her CT scan Her husband is present today and together, they have made informed decision to hold treatment after today's dose My plan would be to repeat imaging study after Christmas for follow-up   No orders of the defined types were  placed in this encounter.   All questions were answered. The patient knows to call the clinic with any problems, questions or concerns. The total time spent in the appointment was 30 minutes encounter with patients including review of chart and various tests results, discussions about plan of care and coordination of care plan   [name], MD 12/02/2019 8:50 AM  INTERVAL HISTORY: Please see below for problem oriented charting. She returns with her husband for further follow-up She denies worsening neuropathy She has close follow-up with her endocrinologist with recent adjustment to her insulin doses around treatment The patient denies any recent signs or symptoms of bleeding such as spontaneous epistaxis, hematuria or hematochezia. 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 Had allergic reaction to carboplatin. Avastin was stopped due to GI hemorrhage   Primary peritoneal carcinomatosis ([place])  09/06/2011 Imaging   CT findings consistent with cirrhotic changes involving the liver.  No worrisome liver mass.  There are associated portal venous collaterals and splenomegaly consistent with portal venous hypertension. 2.  No other significant upper abdominal findings   11/08/2013 Imaging   US showed ascites   11/08/2013 Initial Diagnosis   Patient had ~ 2 months of some urinary incontinence, saw Dr Ronita Hipps in 10-2013, with pelvic mass on exam    11/12/2013 Imaging   There are findings of progressive hepatic cirrhosis with a large amount of ascites and mild splenomegaly. There are venous collaterals present. 2. There is abnormal thickening of the peritoneal surface along the left mid and lower abdominal wall. There is abnormal soft  tissue density in the pelvis as well which could reflect the clinically suspected ovarian malignancy  but the findings are nondiagnostic. 3. There is a new small left pleural effusion. 4. There is no acute bowel abnormality.   11/18/2013 Imaging   Successful ultrasound guided diagnostic and therapeutic paracentesis yielding 4.1 liters of ascites.    11/18/2013 Pathology Results   PERITONEAL/ASCITIC FLUID (SPECIMEN 1 OF 1 COLLECTED 11/18/13): SEROUS CARCINOMA, PLEASE SEE COMMENT.   12/01/2013 Tumor Marker   Patient's tumor was tested for the following markers: CA125 Results of the tumor marker test revealed 1938   12/03/2013 Imaging   Successful ultrasound-guided therapeutic paracentesis yielding 3.6 liters of peritoneal fluid.   12/07/2013 - 05/30/2014 Chemotherapy   She received 3 cycles of carboplatin and Taxol, interrupted cycle 4 for surgery.  She subsequently completed 3 more cycles of chemotherapy after surgery   12/20/2013 Imaging   Successful ultrasound-guided diagnostic and therapeutic paracentesis yielding 2.2 liters of peritoneal fluid   12/20/2013 Pathology Results   PERITONEAL/ASCITIC FLUID(SPECIMEN 1 OF 1 COLLECTED 12/20/13): MALIGNANT CELLS CONSISTENT WITH SEROUS CARCINOMA   01/13/2014 Tumor Marker   Patient's tumor was tested for the following markers: CA125 Results of the tumor marker test revealed 227   02/07/2014 Tumor Marker   Patient's tumor was tested for the following markers: CA125 Results of the tumor marker test revealed 130   02/15/2014 Genetic Testing   Genetics testing from 02-2014 normal ([employer]va[employer] panel)   02/23/2014 Imaging   Interval decrease in ascites. 2. Morphologic changes in the liver consistent with cirrhosis. 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 Portal vein remains patent at this time. 3. Persistent but decreased abnormal soft tissue attenuation tracking in the omental fat. This may be secondary to interval improvement in metastatic disease. 4. Peritoneal thickening seen in the left abdomen and pelvis on the  previous study has decreased and nearly resolved in the interval. This also suggests interval improvement in metastatic disease.    03/07/2014 Procedure   Ultrasound and fluoroscopically guided right internal jugular single lumen power port catheter insertion. Tip in the SVC/RA junction. Catheter ready for use.   03/17/2014 Tumor Marker   Patient's tumor was tested for the following markers: CA125 Results of the tumor marker test revealed 45   03/29/2014 Pathology Results   1. Omentum, resection for tumor - HIGH GRADE SEROUS CARCINOMA, SEE COMMENT. 2. Ovary and fallopian tube, right - HIGH GRADE SEROUS CARCINOMA, SEE COMMENT. 3. Ovary and fallopian tube, left - HIGH GRADE SEROUS CARCINOMA, SEE COMMENT. Diagnosis Note 1. Nests and clusters of malignant cells are invading the omental tissue (part #1) with associated fibrosis. The cells are pleomorphic with prominent nucleoli. There are scattered psammoma bodies. The ovaries are atrophic and exhibit multiple foci of surface based invasive carcinoma and associated fibrosis. The fallopian tubes have a few foci of carcinoma, also superficially located. There are no precursor lesions noted in the ovaries or fallopian tubes (the entire tubes and ovaries were submitted for evaluation). While there is some retraction artifact, there are several foci suspicious for lymphovascular invasion. 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 Given the fibrosis, there does appear to be a small amount of treatment effect, however, there is abundant residual tumor.    03/29/2014 Surgery   Procedure(s) Performed: 1. Exploratory laparotomy with bilateral salpingo-oophorectomy, omentectomy radical tumor debulking for ovarian cancer .  Surgeon: Thereasa Solo, MD.  Assistant Surgeon: Lahoma Crocker, M.D. Assistant: (an MD assistant was  necessary for tissue manipulation, retraction and positioning due to the complexity of the case and hospital policies).  Operative Findings: 10cm omental cake from hepatic to splenic flexure, densely adherent to transverse colon. Milliary studding of tumor implants (<52m, too numerous in number to count) adherent to the mesentery of the small bowel and small bowel wall. Small volume (100cc) ascites. Small ovaries bilaterally, densely adherent to the pelvic cul de sac. Left ovary and tube densely adherent to sigmoid colon. Cirrhotic liver with hepatomegaly and splenomegaly.     This represented an optimal cytoreduction (R1) with visible disease residual on the bowel wall and mesentery (millial, <335mimplants).     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 2. 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    08/18/2014 Tumor Marker   Patient's tumor was tested for the following markers: CA125 Results of the tumor marker test revealed 16   10/13/2014 Tumor Marker   Patient's tumor was tested for the following markers: CA125 Results of the tumor marker test revealed 14   11/21/2014 Tumor Marker   Patient's tumor was tested for the following markers: CA125 Results of the tumor marker test revealed 16   12/28/2014 Tumor Marker   Patient's tumor was tested for the following markers: CA125 Results of the tumor marker test revealed 762   02/27/2015 Tumor Marker   Patient's tumor was tested for the following markers:  CA125 Results of the tumor marker test revealed 20.2   03/22/2015 Imaging   Filler appearance to the prior exam, with very subtle fluid tracking along portions of the liver, and very minimal nodularity along the right paracolic gutter representing residua from the prior peritoneal cancer. The current abnormalities could simply be post therapy findings rather than necessarily representing residual malignancy. No new or enlarging lesions are identified. 2. Hepatic cirrhosis and splenomegaly. There is some gastric varices suggesting portal venous hypertension. 3. Left foraminal stenosis at L4-5 due to spurring. There is likely also some central narrowing of the thecal sac at this level. 4. Bibasilar scarring. 5. Chronic mild left mid kidney scarring   03/27/2015 Tumor Marker   Patient's tumor was tested for the following markers: CA125 Results of the tumor marker test revealed 25.3   04/24/2015 Imaging   Disc bulge L2-3 with mild spinal stenosis and right foraminal encroachment. 2. Disc bulge L3-4 with mild spinal stenosis. 3. Multifactorial spinal, left lateral recess and foraminal stenosis L4-5. There is grade 1 anterolisthesis without evident dynamic instability.   05/29/2015 Tumor Marker   Patient's tumor was tested for the following markers: CA125 Results of the tumor marker test revealed 29.9   08/21/2015 Tumor Marker   Patient's tumor was tested for the following markers: CA125 Results of the tumor marker test revealed 45   09/18/2015 Tumor Marker   Patient's tumor was tested for the following markers: CA125 Results of the tumor marker test revealed 47  09/27/2015 Imaging   New small bowel mesenteric nodule and enlarged left external iliac lymph node, highly worrisome for metastatic disease. 2. Cirrhosis and splenomegaly   10/10/2015 Tumor Marker   Patient's tumor was tested for the following markers: CA125 Results of the tumor marker test revealed 53.4   10/10/2015 - 12/11/2015  Chemotherapy   She received 4 cycles of carboplatin only   11/20/2015 Tumor Marker   Patient's tumor was tested for the following markers: CA125 Results of the tumor marker test revealed 41.3   12/20/2015 Imaging   CT: Mild left external iliac lymphadenopathy is slightly decreased. Mildly enlarged lower mesenteric nodule is slightly decreased. 2. No new or progressive metastatic disease in the abdomen or pelvis. 3. Cirrhosis. Stable subcentimeter hypodense left liver lobe lesion. No new liver lesions. 4. Stable mild splenomegaly.  No ascites. 5. Mild sigmoid diverticulosis.   01/01/2016 -  Anti-estrogen oral therapy   She was placed on tamoxifen. Had letrozole initially but switched to tamoxifen due to poor tolerance   01/15/2016 Tumor Marker   Patient's tumor was tested for the following markers: CA125 Results of the tumor marker test revealed 31.4   02/19/2016 Tumor Marker   Patient's tumor was tested for the following markers: CA125 Results of the tumor marker test revealed 36.5   05/13/2016 Imaging   No evidence of disease progression within the abdomen or pelvis. Previously noted small central mesenteric nodule and left external iliac node appear slightly smaller. 2. Stable changes of hepatic cirrhosis and portal hypertension with associated splenomegaly. No new or enlarging hepatic lesions are identified. 3. No acute findings.   05/13/2016 Tumor Marker   Patient's tumor was tested for the following markers: CA125 Results of the tumor marker test revealed 41.2   06/24/2016 Tumor Marker   Patient's tumor was tested for the following markers: CA125 Results of the tumor marker test revealed 47.3   08/20/2016 Imaging   1. The left external iliac lymph node is slightly increased in size compared to the prior exam, currently 1.1 cm and previously 0.9 cm in diameter. 2. Stable central mesenteric lymph node at 0.9 cm in diameter. 3. Stable trace free pelvic fluid and stable slight thickening  along the right paracolic gutter, without well-defined peritoneal nodularity. 4. Other imaging findings of potential clinical significance: Subsegmental atelectasis or scarring in the lung bases. Hepatic cirrhosis. Left renal scarring. Mild splenomegaly. Sigmoid colon diverticulosis. Impingement at L4-5 due to spondylosis and degenerative disc disease broad Schmorl' s nodes at L3-L4. Pelvic floor laxity   08/23/2016 Tumor Marker   Patient's tumor was tested for the following markers: CA125 Results of the tumor marker test revealed 80.2   09/04/2016 Imaging   LV EF: 60% -   65%   09/12/2016 Tumor Marker   Patient's tumor was tested for the following markers: CA125 Results of the tumor marker test revealed 98.5   09/12/2016 - 01/29/2018 Chemotherapy   The patient received Doxil and Avastin. Avastin was discontinued in Dec 2019 due to GI hemorrhage   09/26/2016 Tumor Marker   Patient's tumor was tested for the following markers: CA125 Results of the tumor marker test revealed 68.9   10/10/2016 Tumor Marker   Patient's tumor was tested for the following markers: CA125 Results of the tumor marker test revealed 65.6   11/07/2016 Tumor Marker   Patient's tumor was tested for the following markers: CA125 Results of the tumor marker test revealed 46.4   11/29/2016 Imaging   Stable mild peritoneal thickening, suspicious  for peritoneal carcinomatosis. No new or progressive disease identified. No evidence of ascites.  No significant change in 10 mm left external iliac and 8 mm mesenteric lymph nodes.  Stable hepatic cirrhosis, and splenomegaly consistent with portal venous hypertension.  Colonic diverticulosis, without radiographic evidence of diverticulitis.   12/02/2016 Imaging   Normal LV size with EF 55%. Basal inferior and basal inferoseptal hypokinesis. Normal RV size and systolic function. No significant valvular abnormalities.   12/05/2016 Tumor Marker   Patient's tumor was tested for  the following markers: CA125 Results of the tumor marker test revealed 49.1   01/09/2017 Tumor Marker   Patient's tumor was tested for the following markers: CA125 Results of the tumor marker test revealed 47.2   02/06/2017 Tumor Marker   Patient's tumor was tested for the following markers: CA125 Results of the tumor marker test revealed 44.1   02/18/2017 Imaging   Stable mild peritoneal thickening. No new or progressive disease identified within the abdomen or pelvis.  Stable tiny sub-cm left external iliac and mesenteric lymph nodes.  Stable hepatic cirrhosis, and splenomegaly consistent with portal venous hypertension. No evidence of hepatic neoplasm.  Colonic diverticulosis, without radiographic evidence of diverticulitis.   03/06/2017 Tumor Marker   Patient's tumor was tested for the following markers: CA125 Results of the tumor marker test revealed 50.4   03/17/2017 Imaging   - Left ventricle: The cavity size was normal. Wall thickness was normal. Systolic function was normal. The estimated ejection fraction was in the range of 55% to 60%. Wall motion was normal; there were no regional wall motion abnormalities. Features are consistent with a pseudonormal left ventricular filling pattern, with concomitant abnormal relaxation and increased filling pressure (grade 2 diastolic dysfunction). - Mitral valve: There was mild regurgitation. - Left atrium: The atrium was mildly dilated   04/03/2017 Tumor Marker   Patient's tumor was tested for the following markers: CA125 Results of the tumor marker test revealed 47.2   05/14/2017 Imaging   CT scan of abdomen and pelvis Stable mild peritoneal thickening. No new or progressive disease within the abdomen or pelvis.  Stable hepatic cirrhosis. Stable splenomegaly, consistent with portal venous hypertension. No evidence of hepatic neoplasm.   05/15/2017 Tumor Marker   Patient's tumor was tested for the following markers: CA125 Results of the  tumor marker test revealed 56.1   06/12/2017 Tumor Marker   Patient's tumor was tested for the following markers: CA125 Results of the tumor marker test revealed 49.2   07/10/2017 Tumor Marker   Patient's tumor was tested for the following markers: CA125 Results of the tumor marker test revealed 46.3   08/07/2017 Tumor Marker   Patient's tumor was tested for the following markers: CA125 Results of the tumor marker test revealed 52.9   08/20/2017 Imaging   1. Mild omental/peritoneal haziness, unchanged. No evidence of new metastatic disease. 2. Cirrhosis with splenomegaly.   09/04/2017 Tumor Marker   Patient's tumor was tested for the following markers: CA125 Results of the tumor marker test revealed 48.5   11/13/2017 Tumor Marker   Patient's tumor was tested for the following markers: CA125 Results of the tumor marker test revealed 55.8   12/17/2017 Imaging   1. No findings to suggest metastatic disease in the abdomen or pelvis. 2. Severe hepatic cirrhosis with evidence of portal hypertension, as demonstrated by dilated portal vein, splenomegaly and portosystemic collateral pathways, including gastric and esophageal varices.  3. Colonic diverticulosis without evidence of acute diverticulitis at this time. 4. Additional  incidental findings, as above.   01/16/2018 Tumor Marker   Patient's tumor was tested for the following markers: CA125 Results of the tumor marker test revealed 63.7   02/10/2018 Procedure   EGD - Recently bleeding grade III and large (> 5 mm) esophageal varices. Completely eradicated. Banded. - Portal hypertensive gastropathy. - Normal examined duodenum. - No specimens collected   02/13/2018 Tumor Marker   Patient's tumor was tested for the following markers: CA125 Results of the tumor marker test revealed 82.7   02/25/2018 Imaging   Bone density showed mild osteopenia   04/08/2018 Tumor Marker   Patient's tumor was tested for the following markers:  CA125 Results of the tumor marker test revealed 82.7   04/08/2018 Imaging   1. No definite omental or peritoneal surface lesions. However, there are 2 slowly enlarging lymph nodes noted. One is in the small bowel mesentery and the other is in the left deep pelvis. Could not exclude recurrent disease. 2. Stable advanced cirrhotic changes involving the liver with portal venous hypertension, portal venous collaterals, esophageal varices and splenomegaly. No worrisome hepatic lesions. 3. Diffuse wall thickening of the colon could suggest diffuse colitis or could be due to low albumin. Recommend correlation with any symptoms such as diarrhea.   05/21/2018 Tumor Marker   Patient's tumor was tested for the following markers: CA125 Results of the tumor marker test revealed 86   09/07/2018 Tumor Marker   Patient's tumor was tested for the following markers:CA-125 Results of the tumor marker test revealed 111   09/29/2018 Tumor Marker   Patient's tumor was tested for the following markers: CA-125 Results of the tumor marker test revealed 121.   09/29/2018 - 11/24/2018 Chemotherapy   The patient had carboplatin and gemzar for chemotherapy treatment.  Chemo was stopped due to allergic reaction to carboplatin   10/27/2018 Tumor Marker   Patient's tumor was tested for the following markers: CA-125 Results of the tumor marker test revealed 99.9   11/24/2018 Tumor Marker   Patient's tumor was tested for the following markers: CA-125 Results of the tumor marker test revealed 81.2   12/14/2018 Tumor Marker   Patient's tumor was tested for the following markers: CA-125 Results of the tumor marker test revealed 81.2   12/14/2018 Imaging   1. Mild improvement in peritoneal disease. Peritoneal nodule within the left posterior pelvis contiguous with the left side of vaginal cuff is slightly decreased in size in the interval. Within the right iliac fossa there is a small peritoneal nodule which has decreased  in size in the interval. Central small bowel mesenteric lymph node is not significantly changed. Slight decrease in size left periaortic lymph node.    2. Morphologic features of the liver compatible with cirrhosis. Splenomegaly is noted which may reflect portal venous hypertension.   12/21/2018 Tumor Marker   Patient's tumor was tested for the following markers: CA-125 Results of the tumor marker test revealed 64.2   12/21/2018 -  Chemotherapy   The patient had taxotere for chemotherapy treatment.     03/02/2019 Tumor Marker   Patient's tumor was tested for the following markers: CA-125. 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   No new or progressive metastatic disease within the abdomen or pelvis.   Hepatic cirrhosis and findings of portal venous hypertension. 1.1 cm low-attenuation lesion within left hepatic lobe, which could represent a regenerative nodule or small hepatocellular carcinoma.  Recommend abdomen MRI without and with contrast for further characterization.     04/13/2019 Tumor Marker   Patient's tumor was tested for the following markers: CA-125 Results of the tumor marker test revealed 36.8   05/26/2019 Tumor Marker   Patient's tumor was tested for the following markers: CA-125 Results of the tumor marker test revealed 31.6.   06/15/2019 Tumor Marker   Patient's tumor was tested for the following markers: CA-125 Results of the tumor marker test revealed 25.4   07/05/2019 Imaging   1. Stable CT examination. Stable peritoneal thickening in the pelvis and paracolic gutters. Stable solid left deep pelvic implant. Stable trace ascites. No new or progressive metastatic disease. 2. Stable cirrhosis. No discrete liver masses. 3. Stable mild-to-moderate splenomegaly. 4. Aortic Atherosclerosis (ICD10-I70.0).   07/27/2019 Tumor Marker   Patient's tumor was tested for the  following markers: CA-125 Results of the tumor marker test revealed 35.4   08/17/2019 Tumor Marker   Patient's tumor was tested for the following markers: Ca-125 Results of the tumor marker test revealed 35.7.   09/17/2019 - 09/21/2019 Hospital Admission   She was admitted due to esophageal variceal bleeding presents with hematemesis and melanotic stool with epigastric pain.  GI consulted, started on octreotide infusion.  Underwent EGD which showed minimal bleeding, remain on octreotide drip, PPI drip.  Hemoglobin trended down therefore received PRBC transfusion.  Over several days her bleeding subsided, hemodynamically remained stable.    09/17/2019 Procedure   EGD report - Esophageal mucosal scarring, from prior esophageal banding sessions. - Grade I esophageal varices. - Red blood in the cardia and in the gastric fundus. - Portal hypertensive gastropathy. - Normal duodenal bulb, first portion of the duodenum and second portion of the duodenum. 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 2. Otherwise stable CT exam. Stable peritoneal thickening within the bilateral deep pelvic peritoneum. Stable left deep pelvic implant.No evidence for progressed disease. 3. Stable splenomegaly. 4. Morphologic changes to the liver compatible with cirrhosis. Trace perihepatic and pelvic ascites.     REVIEW OF SYSTEMS:   Constitutional: Denies fevers, chills or abnormal weight loss Eyes: Denies blurriness of vision Ears, nose, mouth, throat, and face: Denies mucositis or sore throat Respiratory: Denies cough, dyspnea  or wheezes Cardiovascular: Denies palpitation, chest discomfort or lower extremity swelling Gastrointestinal:  Denies nausea, heartburn or change in bowel habits Skin: Denies abnormal skin rashes Lymphatics: Denies new lymphadenopathy or easy bruising Behavioral/Psych: Mood is stable, no new changes  All other systems were reviewed with the patient and are negative.  I have reviewed the past medical history, past surgical history, social history and family history with the patient and they are unchanged from previous note.  ALLERGIES:  is allergic to carboplatin, shellfish allergy, vancomycin, benadryl [diphenhydramine hcl], and penicillins.  MEDICATIONS:  Current Outpatient Medications  Medication Sig Dispense Refill  . cholecalciferol (VITAMIN D) 1000 units tablet Take 1,000 Units by mouth at bedtime.     Marland Kitchen dexamethasone (DECADRON) 4 MG tablet Start the day before Taxotere, take 1 tablet in the morning, and then 1 tablet daily after chemo for 2 days. Pls dispense 30 tabs (Patient taking differently: Take 4  mg by mouth See admin instructions. Start the day before Taxotere, take 1 tablet in the morning, and then 1 tablet daily after chemo for 2 days. Pls dispense 30 tabs) 30 tablet 1  . gabapentin (NEURONTIN) 300 MG capsule TAKE 3 CAPSULES(900 MG) BY MOUTH TWICE DAILY 120 capsule 11  . HUMALOG MIX 75/25 KWIKPEN (75-25) 100 UNIT/ML Kwikpen Inject 22 Units into the skin 2 (two) times daily. Before breakfast & before supper Except when taking Chemo take 32 units  For three to five days    . HYDROcodone-acetaminophen (NORCO/VICODIN) 5-325 MG tablet Take 1 tablet by mouth every 6 (six) hours as needed for moderate pain. 60 tablet 0  . lidocaine-prilocaine (EMLA) cream Apply to Porta-cath  1-2 hours prior to access as directed. (Patient taking differently: Apply 1 application topically daily as needed (port access). ) 30 g 9  . nadolol (CORGARD) 20 MG tablet Take 1 tablet (20 mg total) by mouth daily.  (Patient taking differently: Take 20 mg by mouth at bedtime. ) 90 tablet 3  . ondansetron (ZOFRAN) 8 MG tablet Take 1 tablet (8 mg total) by mouth every 8 (eight) hours as needed. Start on the third day after chemotherapy. (Patient taking differently: Take 8 mg by mouth every 8 (eight) hours as needed for nausea. Start on the third day after chemotherapy.) 30 tablet 1  . pantoprazole (PROTONIX) 40 MG tablet Take 1 tablet (40 mg total) by mouth daily.    Marland Kitchen PROAIR HFA 108 (90 Base) MCG/ACT inhaler Inhale 2 puffs into the lungs every 4 (four) hours as needed for wheezing or shortness of breath.     . prochlorperazine (COMPAZINE) 10 MG tablet Take 1 tablet (10 mg total) by mouth every 6 (six) hours as needed (Nausea or vomiting). 30 tablet 1  . rotigotine (NEUPRO) 4 MG/24HR APPLY 1 PATCH EXTERNALLY TO THE SKIN DAILY 30 patch 6   No current facility-administered medications for this visit.    PHYSICAL EXAMINATION: ECOG PERFORMANCE STATUS: 1 - Symptomatic but completely ambulatory  Vitals:   12/02/19 0820  BP: (!) 156/63  Pulse: 69  Resp: 18  Temp: 97.6 F (36.4 C)  SpO2: 100%   Filed Weights   12/02/19 0820  Weight: 190 lb 9.6 oz (86.5 kg)    GENERAL:alert, no distress and comfortable SKIN: skin color, texture, turgor are normal, no rashes or significant lesions EYES: normal, Conjunctiva are pink and non-injected, sclera clear OROPHARYNX:no exudate, no erythema and lips, buccal mucosa, and tongue normal  NECK: supple, thyroid normal size, non-tender, without nodularity LYMPH:  no palpable lymphadenopathy in the cervical, axillary or inguinal LUNGS: clear to auscultation and percussion with normal breathing effort HEART: regular rate & rhythm and no murmurs and no lower extremity edema ABDOMEN:abdomen soft, non-tender and normal bowel sounds Musculoskeletal:no cyanosis of digits and no clubbing  NEURO: alert & oriented x 3 with fluent speech, no focal motor/sensory  deficits  LABORATORY DATA:  I have reviewed the data as listed    Component Value Date/Time   NA 143 12/02/2019 0807   NA 142 02/06/2017 0742   K 3.8 12/02/2019 0807   K 3.9 02/06/2017 0742   CL 110 12/02/2019 0807   CO2 25 12/02/2019 0807   CO2 22 02/06/2017 0742   GLUCOSE 120 (H) 12/02/2019 0807   GLUCOSE 156 (H) 02/06/2017 0742   BUN 18 12/02/2019 0807   BUN 13.5 02/06/2017 0742   CREATININE 0.78 12/02/2019 0807   CREATININE 0.85 10/26/2019 0240  CREATININE 0.8 02/06/2017 0742   CALCIUM 9.0 12/02/2019 0807   CALCIUM 8.5 02/06/2017 0742   PROT 7.2 12/02/2019 0807   PROT 6.9 02/06/2017 0742   ALBUMIN 3.7 12/02/2019 0807   ALBUMIN 3.7 02/06/2017 0742   AST 22 12/02/2019 0807   AST 24 10/26/2019 0722   AST 47 (H) 02/06/2017 0742   ALT 23 12/02/2019 0807   ALT 17 10/26/2019 0722   ALT 54 02/06/2017 0742   ALKPHOS 124 12/02/2019 0807   ALKPHOS 130 02/06/2017 0742   BILITOT 0.5 12/02/2019 0807   BILITOT 0.7 10/26/2019 0722   BILITOT 0.65 02/06/2017 0742   GFRNONAA >60 12/02/2019 0807   GFRNONAA >60 10/26/2019 0722   GFRAA >60 11/09/2019 0953   GFRAA >60 10/26/2019 0722    No results found for: SPEP, UPEP  Lab Results  Component Value Date   WBC 6.0 12/02/2019   NEUTROABS 4.4 12/02/2019   HGB 11.0 (L) 12/02/2019   HCT 34.5 (L) 12/02/2019   MCV 90.8 12/02/2019   PLT 111 (L) 12/02/2019      Chemistry      Component Value Date/Time   NA 143 12/02/2019 0807   NA 142 02/06/2017 0742   K 3.8 12/02/2019 0807   K 3.9 02/06/2017 0742   CL 110 12/02/2019 0807   CO2 25 12/02/2019 0807   CO2 22 02/06/2017 0742   BUN 18 12/02/2019 0807   BUN 13.5 02/06/2017 0742   CREATININE 0.78 12/02/2019 0807   CREATININE 0.85 10/26/2019 0722   CREATININE 0.8 02/06/2017 0742      Component Value Date/Time   CALCIUM 9.0 12/02/2019 0807   CALCIUM 8.5 02/06/2017 0742   ALKPHOS 124 12/02/2019 0807   ALKPHOS 130 02/06/2017 0742   AST 22 12/02/2019 0807   AST 24 10/26/2019  0722   AST 47 (H) 02/06/2017 0742   ALT 23 12/02/2019 0807   ALT 17 10/26/2019 0722   ALT 54 02/06/2017 0742   BILITOT 0.5 12/02/2019 0807   BILITOT 0.7 10/26/2019 0722   BILITOT 0.65 02/06/2017 3235

## 2019-12-02 NOTE — Assessment & Plan Note (Signed)
She has multifactorial anemia, with component of iron deficiency I plan to schedule intravenous iron infusion again today Due to her history of intermittent variceal bleed, I would like to try to normalize her hemoglobin and ferritin at least 100

## 2019-12-02 NOTE — Assessment & Plan Note (Signed)
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

## 2019-12-02 NOTE — Patient Instructions (Signed)
[facility] Discharge Instructions for Patients Receiving Chemotherapy  Today you received the following chemotherapy agent: Taxotere  To help prevent nausea and vomiting after your treatment, we encourage you to take your nausea medication as directed by your MD.   If you develop nausea and vomiting that is not controlled by your nausea medication, call the clinic.   BELOW ARE SYMPTOMS THAT SHOULD BE REPORTED IMMEDIATELY:  *FEVER GREATER THAN 100.5 F  *CHILLS WITH OR WITHOUT FEVER  NAUSEA AND VOMITING THAT IS NOT CONTROLLED WITH YOUR NAUSEA MEDICATION  *UNUSUAL SHORTNESS OF BREATH  *UNUSUAL BRUISING OR BLEEDING  TENDERNESS IN MOUTH AND THROAT WITH OR WITHOUT PRESENCE OF ULCERS  *URINARY PROBLEMS  *BOWEL PROBLEMS  UNUSUAL RASH Items with * indicate a potential emergency and should be followed up as soon as possible.  Feel free to call the clinic should you have any questions or concerns. The clinic phone number is [phone].  Please show the [facility] [facility] at check-in to the Emergency Department and triage nurse.  Pegfilgrastim injection What is this medicine? PEGFILGRASTIM (PEG fil gra stim) is a long-acting granulocyte colony-stimulating factor that stimulates the growth of neutrophils, a type of white blood cell important in the body's fight against infection. It is used to reduce the incidence of fever and infection in patients with certain types of cancer who are receiving chemotherapy that affects the bone marrow, and to increase survival after being exposed to high doses of radiation. This medicine may be used for other purposes; ask your health care provider or pharmacist if you have questions. COMMON BRAND NAME(S): Steve Rattler, Ziextenzo What should I tell my health care provider before I take this medicine? They need to know if you have any of these conditions:  kidney disease  latex allergy  ongoing radiation  therapy  sickle cell disease  skin reactions to acrylic adhesives (On-Body Injector only)  an unusual or allergic reaction to pegfilgrastim, filgrastim, other medicines, foods, dyes, or preservatives  pregnant or trying to get pregnant  breast-feeding How should I use this medicine? This medicine is for injection under the skin. If you get this medicine at home, you will be taught how to prepare and give the pre-filled syringe or how to use the On-body Injector. Refer to the patient Instructions for Use for detailed instructions. Use exactly as directed. Tell your healthcare provider immediately if you suspect that the On-body Injector may not have performed as intended or if you suspect the use of the On-body Injector resulted in a missed or partial dose. It is important that you put your used needles and syringes in a special sharps container. Do not put them in a trash can. If you do not have a sharps container, call your pharmacist or healthcare provider to get one. Talk to your pediatrician regarding the use of this medicine in children. While this drug may be prescribed for selected conditions, precautions do apply. Overdosage: If you think you have taken too much of this medicine contact a poison control center or emergency room at once. NOTE: This medicine is only for you. Do not share this medicine with others. What if I miss a dose? It is important not to miss your dose. Call your doctor or health care professional if you miss your dose. If you miss a dose due to an On-body Injector failure or leakage, a new dose should be administered as soon as possible using a single prefilled syringe for manual use.  What may interact with this medicine? Interactions have not been studied. Give your health care provider a list of all the medicines, herbs, non-prescription drugs, or dietary supplements you use. Also tell them if you smoke, drink alcohol, or use illegal drugs. Some items may interact  with your medicine. This list may not describe all possible interactions. Give your health care provider a list of all the medicines, herbs, non-prescription drugs, or dietary supplements you use. Also tell them if you smoke, drink alcohol, or use illegal drugs. Some items may interact with your medicine. What should I watch for while using this medicine? You may need blood work done while you are taking this medicine. If you are going to need a MRI, CT scan, or other procedure, tell your doctor that you are using this medicine (On-Body Injector only). What side effects may I notice from receiving this medicine? Side effects that you should report to your doctor or health care professional as soon as possible:  allergic reactions like skin rash, itching or hives, swelling of the face, lips, or tongue  back pain  dizziness  fever  pain, redness, or irritation at site where injected  pinpoint red spots on the skin  red or dark-brown urine  shortness of breath or breathing problems  stomach or side pain, or pain at the shoulder  swelling  tiredness  trouble passing urine or change in the amount of urine Side effects that usually do not require medical attention (report to your doctor or health care professional if they continue or are bothersome):  bone pain  muscle pain This list may not describe all possible side effects. Call your doctor for medical advice about side effects. You may report side effects to FDA at 1-800-FDA-1088. Where should I keep my medicine? Keep out of the reach of children. If you are using this medicine at home, you will be instructed on how to store it. Throw away any unused medicine after the expiration date on the label. NOTE: This sheet is a summary. It may not cover all possible information. If you have questions about this medicine, talk to your doctor, pharmacist, or health care provider.  2020 Elsevier/Gold Standard (2017-05-05  16:57:08)  Ferumoxytol injection What is this medicine? FERUMOXYTOL is an iron complex. Iron is used to make healthy red blood cells, which carry oxygen and nutrients throughout the body. This medicine is used to treat iron deficiency anemia. This medicine may be used for other purposes; ask your health care provider or pharmacist if you have questions. COMMON BRAND NAME(S): Feraheme What should I tell my health care provider before I take this medicine? They need to know if you have any of these conditions:  anemia not caused by low iron levels  high levels of iron in the blood  magnetic resonance imaging (MRI) test scheduled  an unusual or allergic reaction to iron, other medicines, foods, dyes, or preservatives  pregnant or trying to get pregnant  breast-feeding How should I use this medicine? This medicine is for injection into a vein. It is given by a health care professional in a hospital or clinic setting. Talk to your pediatrician regarding the use of this medicine in children. Special care may be needed. Overdosage: If you think you have taken too much of this medicine contact a poison control center or emergency room at once. NOTE: This medicine is only for you. Do not share this medicine with others. What if I miss a dose? It is important  not to miss your dose. Call your doctor or health care professional if you are unable to keep an appointment. What may interact with this medicine? This medicine may interact with the following medications:  other iron products This list may not describe all possible interactions. Give your health care provider a list of all the medicines, herbs, non-prescription drugs, or dietary supplements you use. Also tell them if you smoke, drink alcohol, or use illegal drugs. Some items may interact with your medicine. What should I watch for while using this medicine? Visit your doctor or healthcare professional regularly. Tell your doctor or  healthcare professional if your symptoms do not start to get better or if they get worse. You may need blood work done while you are taking this medicine. You may need to follow a special diet. Talk to your doctor. Foods that contain iron include: whole grains/cereals, dried fruits, beans, or peas, leafy green vegetables, and organ meats (liver, kidney). What side effects may I notice from receiving this medicine? Side effects that you should report to your doctor or health care professional as soon as possible:  allergic reactions like skin rash, itching or hives, swelling of the face, lips, or tongue  breathing problems  changes in blood pressure  feeling faint or lightheaded, falls  fever or chills  flushing, sweating, or hot feelings  swelling of the ankles or feet Side effects that usually do not require medical attention (report to your doctor or health care professional if they continue or are bothersome):  diarrhea  headache  nausea, vomiting  stomach pain This list may not describe all possible side effects. Call your doctor for medical advice about side effects. You may report side effects to FDA at 1-800-FDA-1088. Where should I keep my medicine? This drug is given in a hospital or clinic and will not be stored at home. NOTE: This sheet is a summary. It may not cover all possible information. If you have questions about this medicine, talk to your doctor, pharmacist, or health care provider.  2020 Elsevier/Gold Standard (2016-03-18 20:21:10)

## 2019-12-02 NOTE — Assessment & Plan Note (Signed)
I have reviewed the plan of care with the patient and her husband again today We discussed the risk, benefits, side effects of continuing treatment versus stopping Given stability of her CT imaging in significant toxicities from the chemotherapy, ultimately, she is in agreement to proceed with 1 more dose today and then take a break I plan to see her at the end of November for further follow-up, review of blood count and possible further intravenous iron infusion

## 2019-12-02 NOTE — Assessment & Plan Note (Signed)
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

## 2019-12-02 NOTE — Assessment & Plan Note (Signed)
She has history of poorly controlled diabetes She has some recent follow-up with her endocrinologist with changes of her regimen Her blood sugar control today is much improved

## 2020-01-11 ENCOUNTER — Inpatient Hospital Stay (HOSPITAL_BASED_OUTPATIENT_CLINIC_OR_DEPARTMENT_OTHER): Payer: Medicare Other | Admitting: Hematology and Oncology

## 2020-01-11 ENCOUNTER — Inpatient Hospital Stay: Payer: Medicare Other | Attending: Hematology and Oncology

## 2020-01-11 ENCOUNTER — Inpatient Hospital Stay: Payer: Medicare Other

## 2020-01-11 ENCOUNTER — Other Ambulatory Visit: Payer: Self-pay

## 2020-01-11 VITALS — BP 133/62 | HR 65 | Temp 97.5°F | Resp 18 | Ht 61.5 in | Wt 190.2 lb

## 2020-01-11 DIAGNOSIS — Z79899 Other long term (current) drug therapy: Secondary | ICD-10-CM | POA: Insufficient documentation

## 2020-01-11 DIAGNOSIS — M545 Low back pain, unspecified: Secondary | ICD-10-CM

## 2020-01-11 DIAGNOSIS — D509 Iron deficiency anemia, unspecified: Secondary | ICD-10-CM | POA: Diagnosis not present

## 2020-01-11 DIAGNOSIS — C482 Malignant neoplasm of peritoneum, unspecified: Secondary | ICD-10-CM

## 2020-01-11 DIAGNOSIS — D696 Thrombocytopenia, unspecified: Secondary | ICD-10-CM

## 2020-01-11 DIAGNOSIS — Z7189 Other specified counseling: Secondary | ICD-10-CM

## 2020-01-11 DIAGNOSIS — C569 Malignant neoplasm of unspecified ovary: Secondary | ICD-10-CM

## 2020-01-11 DIAGNOSIS — G8929 Other chronic pain: Secondary | ICD-10-CM

## 2020-01-11 LAB — COMPREHENSIVE METABOLIC PANEL
ALT: 27 U/L (ref 0–44)
AST: 27 U/L (ref 15–41)
Albumin: 3.7 g/dL (ref 3.5–5.0)
Alkaline Phosphatase: 117 U/L (ref 38–126)
Anion gap: 8 (ref 5–15)
BUN: 15 mg/dL (ref 8–23)
CO2: 24 mmol/L (ref 22–32)
Calcium: 8.8 mg/dL — ABNORMAL LOW (ref 8.9–10.3)
Chloride: 109 mmol/L (ref 98–111)
Creatinine, Ser: 0.76 mg/dL (ref 0.44–1.00)
GFR, Estimated: >60 mL/min (ref >60)
Glucose, Bld: 119 mg/dL — ABNORMAL HIGH (ref 70–99)
Potassium: 3.9 mmol/L (ref 3.5–5.1)
Sodium: 141 mmol/L (ref 135–145)
Total Bilirubin: 0.8 mg/dL (ref 0.3–1.2)
Total Protein: 7.3 g/dL (ref 6.5–8.1)

## 2020-01-11 LAB — CBC WITH DIFFERENTIAL/PLATELET
Abs Immature Granulocytes: 0.01 10*3/uL (ref 0.00–0.07)
Basophils Absolute: 0 10*3/uL (ref 0.0–0.1)
Basophils Relative: 1 %
Eosinophils Absolute: 0.2 10*3/uL (ref 0.0–0.5)
Eosinophils Relative: 4 %
HCT: 37.5 % (ref 36.0–46.0)
Hemoglobin: 12.4 g/dL (ref 12.0–15.0)
Immature Granulocytes: 0 %
Lymphocytes Relative: 27 %
Lymphs Abs: 1.1 10*3/uL (ref 0.7–4.0)
MCH: 29.8 pg (ref 26.0–34.0)
MCHC: 33.1 g/dL (ref 30.0–36.0)
MCV: 90.1 fL (ref 80.0–100.0)
Monocytes Absolute: 0.2 10*3/uL (ref 0.1–1.0)
Monocytes Relative: 6 %
Neutro Abs: 2.4 10*3/uL (ref 1.7–7.7)
Neutrophils Relative %: 62 %
Platelets: 78 10*3/uL — ABNORMAL LOW (ref 150–400)
RBC: 4.16 MIL/uL (ref 3.87–5.11)
RDW: 18.5 % — ABNORMAL HIGH (ref 11.5–15.5)
WBC: 3.9 10*3/uL — ABNORMAL LOW (ref 4.0–10.5)
nRBC: 0 % (ref 0.0–0.2)

## 2020-01-11 MED ORDER — SODIUM CHLORIDE 0.9% FLUSH
10.0000 mL | Freq: Once | INTRAVENOUS | Status: AC
  Administered 2020-01-11: 10 mL
  Filled 2020-01-11: qty 10

## 2020-01-11 MED ORDER — HYDROCODONE-ACETAMINOPHEN 5-325 MG PO TABS: 1.0000 | ORAL_TABLET | Freq: Four times a day (QID) | ORAL | 0 refills | Status: DC | PRN

## 2020-01-11 MED ORDER — ONDANSETRON HCL 8 MG PO TABS: 8.0000 mg | ORAL_TABLET | Freq: Three times a day (TID) | ORAL | 1 refills | Status: DC | PRN

## 2020-01-12 ENCOUNTER — Encounter: Payer: Self-pay | Admitting: Hematology and Oncology

## 2020-01-12 NOTE — Progress Notes (Signed)
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[facility]) Clinically, she have no signs or symptoms of active cancer Per previous discussion, I plan to repeat imaging study in January I will also order tumor marker for assessment If CT imaging show disease progression, we will resume treatment However, if CT imaging showed no evidence of active disease, we will continue treatment holiday  Iron deficiency anemia She has received recent IV iron She is not anemic today I do not recommend further IV iron infusion I will recheck in her next visit  Thrombocytopenia ([place]) She has multifactorial pancytopenia, due to recent chemotherapy, liver disease with splenomegaly She does not need treatment for thrombocytopenia  Low back pain She is taking chronic hydrocodone for chronic back pain I refill her prescription today We discussed narcotic refill policy   Orders Placed This Encounter  Procedures  . CT CHEST ABDOMEN PELVIS W CONTRAST    Standing Status:   Future    Standing Expiration Date:   01/10/2021    Order Specific Question:   Preferred imaging location?    Answer:   [facility]    Order Specific Question:   Radiology Contrast Protocol - do NOT remove file path    Answer:   [email or web address]  . CA 125    Standing Status:   Standing    Number of Occurrences:   11    Standing Expiration Date:   01/11/2021  . Iron and TIBC    Standing Status:   Standing    Number of Occurrences:   11    Standing Expiration Date:   01/11/2021  . Ferritin    Standing Status:   Standing    Number of Occurrences:   11    Standing Expiration Date:   01/11/2021    All questions were answered. The patient knows to call the clinic with any problems, questions or  concerns. The total time spent in the appointment was 20 minutes encounter with patients including review of chart and various tests results, discussions about plan of care and coordination of care plan   [name], MD 01/12/2020 8:00 AM  INTERVAL HISTORY: Please see below for problem oriented charting. 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 Had allergic reaction to carboplatin. Avastin was stopped due to GI hemorrhage   Primary peritoneal carcinomatosis ([place])  09/06/2011 Imaging   CT findings consistent with cirrhotic changes involving the liver.  No worrisome liver mass.  There are associated portal venous collaterals and splenomegaly consistent with portal venous hypertension. 2.  No other significant upper abdominal findings   11/08/2013 Imaging   US showed ascites   11/08/2013 Initial Diagnosis   Patient had ~ 2 months of some urinary incontinence, saw Dr Ronita Hipps in 10-2013, with pelvic mass on exam    11/12/2013 Imaging   There are findings of progressive hepatic cirrhosis with a large amount of ascites and mild splenomegaly. There are venous collaterals present. 2. There is abnormal thickening of the peritoneal surface  along the left mid and lower abdominal wall. There is abnormal soft tissue density in the pelvis as well which could reflect the clinically suspected ovarian malignancy but the findings are nondiagnostic. 3. There is a new small left pleural effusion. 4. There is no acute bowel abnormality.   11/18/2013 Imaging   Successful ultrasound guided diagnostic and therapeutic paracentesis yielding 4.1 liters  of ascites.    11/18/2013 Pathology Results   PERITONEAL/ASCITIC FLUID (SPECIMEN 1 OF 1 COLLECTED 11/18/13): SEROUS CARCINOMA, PLEASE SEE COMMENT.   12/01/2013 Tumor Marker   Patient's tumor was tested for the following markers: CA125 Results of the tumor marker test revealed 1938   12/03/2013 Imaging   Successful ultrasound-guided therapeutic paracentesis yielding 3.6 liters of peritoneal fluid.   12/07/2013 - 05/30/2014 Chemotherapy   She received 3 cycles of carboplatin and Taxol, interrupted cycle 4 for surgery.  She subsequently completed 3 more cycles of chemotherapy after surgery   12/20/2013 Imaging   Successful ultrasound-guided diagnostic and therapeutic paracentesis yielding 2.2 liters of peritoneal fluid   12/20/2013 Pathology Results   PERITONEAL/ASCITIC FLUID(SPECIMEN 1 OF 1 COLLECTED 12/20/13): MALIGNANT CELLS CONSISTENT WITH SEROUS CARCINOMA   01/13/2014 Tumor Marker   Patient's tumor was tested for the following markers: CA125 Results of the tumor marker test revealed 227   02/07/2014 Tumor Marker   Patient's tumor was tested for the following markers: CA125 Results of the tumor marker test revealed 130   02/15/2014 Genetic Testing   Genetics testing from 02-2014 normal (OvaNext panel)   02/23/2014 Imaging   Interval decrease in ascites. 2. Morphologic changes in the liver consistent with cirrhosis. 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 Portal vein remains patent at this time. 3. Persistent but decreased abnormal soft tissue attenuation tracking in the omental fat. This may be secondary to interval improvement in metastatic disease. 4. Peritoneal thickening seen in the left abdomen and pelvis on the previous study has decreased and nearly resolved in the interval. This also suggests interval improvement in metastatic disease.    03/07/2014 Procedure   Ultrasound and fluoroscopically guided right internal jugular single lumen power  port catheter insertion. Tip in the SVC/RA junction. Catheter ready for use.   03/17/2014 Tumor Marker   Patient's tumor was tested for the following markers: CA125 Results of the tumor marker test revealed 45   03/29/2014 Pathology Results   1. Omentum, resection for tumor - HIGH GRADE SEROUS CARCINOMA, SEE COMMENT. 2. Ovary and fallopian tube, right - HIGH GRADE SEROUS CARCINOMA, SEE COMMENT. 3. Ovary and fallopian tube, left - HIGH GRADE SEROUS CARCINOMA, SEE COMMENT. Diagnosis Note 1. Nests and clusters of malignant cells are invading the omental tissue (part #1) with associated fibrosis. The cells are pleomorphic with prominent nucleoli. There are scattered psammoma bodies. The ovaries are atrophic and exhibit multiple foci of surface based invasive carcinoma and associated fibrosis. The fallopian tubes have a few foci of carcinoma, also superficially located. There are no precursor lesions noted in the ovaries or fallopian tubes (the entire tubes and ovaries were submitted for evaluation). While there is some retraction artifact, there are several foci suspicious for lymphovascular invasion. 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 Given the fibrosis, there does appear to be a small amount of treatment effect, however, there is abundant residual tumor.    03/29/2014 Surgery   Procedure(s) Performed: 1. Exploratory  laparotomy with bilateral salpingo-oophorectomy, omentectomy radical tumor debulking for ovarian cancer .  Surgeon: Thereasa Solo, MD.  Assistant Surgeon: Lahoma Crocker, M.D. Assistant: (an MD assistant was necessary for tissue manipulation, retraction and positioning due to the complexity of the case and hospital policies).  Operative Findings: 10cm omental cake from hepatic to splenic flexure, densely adherent to transverse colon. Milliary  studding of tumor implants (<80m, too numerous in number to count) adherent to the mesentery of the small bowel and small bowel wall. Small volume (100cc) ascites. Small ovaries bilaterally, densely adherent to the pelvic cul de sac. Left ovary and tube densely adherent to sigmoid colon. Cirrhotic liver with hepatomegaly and splenomegaly.     This represented an optimal cytoreduction (R1) with visible disease residual on the bowel wall and mesentery (millial, <362mimplants).     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 2. 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    08/18/2014 Tumor Marker   Patient's tumor was tested for the following markers: CA125 Results of the tumor marker test revealed 16   10/13/2014 Tumor Marker   Patient's tumor was tested for the following markers: CA125 Results of the tumor marker test revealed 14   11/21/2014 Tumor Marker   Patient's tumor was tested for the following markers: CA125 Results of the tumor marker test revealed 16   12/28/2014 Tumor Marker   Patient's tumor was tested for the following markers: CA125 Results of the tumor marker test revealed 762   02/27/2015 Tumor Marker   Patient's tumor was tested for the following markers: CA125 Results of the tumor marker test revealed 20.2   03/22/2015 Imaging   Filler appearance to the prior exam, with very subtle fluid tracking along portions of the liver, and very minimal nodularity along the right paracolic gutter  representing residua from the prior peritoneal cancer. The current abnormalities could simply be post therapy findings rather than necessarily representing residual malignancy. No new or enlarging lesions are identified. 2. Hepatic cirrhosis and splenomegaly. There is some gastric varices suggesting portal venous hypertension. 3. Left foraminal stenosis at L4-5 due to spurring. There is likely also some central narrowing of the thecal sac at this level. 4. Bibasilar scarring. 5. Chronic mild left mid kidney scarring   03/27/2015 Tumor Marker   Patient's tumor was tested for the following markers: CA125 Results of the tumor marker test revealed 25.3   04/24/2015 Imaging   Disc bulge L2-3 with mild spinal stenosis and right foraminal encroachment. 2. Disc bulge L3-4 with mild spinal stenosis. 3. Multifactorial spinal, left lateral recess and foraminal stenosis L4-5. There is grade 1 anterolisthesis without evident dynamic instability.   05/29/2015 Tumor Marker   Patient's tumor was tested for the following markers: CA125 Results of the tumor marker test revealed 29.9   08/21/2015 Tumor Marker   Patient's tumor was tested for the following markers: CA125 Results of the tumor marker test revealed 45   09/18/2015 Tumor Marker   Patient's tumor was tested for the  following markers: CA125 Results of the tumor marker test revealed 47   09/27/2015 Imaging   New small bowel mesenteric nodule and enlarged left external iliac lymph node, highly worrisome for metastatic disease. 2. Cirrhosis and splenomegaly   10/10/2015 Tumor Marker   Patient's tumor was tested for the following markers: CA125 Results of the tumor marker test revealed 53.4   10/10/2015 - 12/11/2015 Chemotherapy   She received 4 cycles of carboplatin only   11/20/2015 Tumor Marker   Patient's tumor was tested for the following markers: CA125 Results of the tumor marker test revealed 41.3   12/20/2015 Imaging   CT: Mild left external  iliac lymphadenopathy is slightly decreased. Mildly enlarged lower mesenteric nodule is slightly decreased. 2. No new or progressive metastatic disease in the abdomen or pelvis. 3. Cirrhosis. Stable subcentimeter hypodense left liver lobe lesion. No new liver lesions. 4. Stable mild splenomegaly.  No ascites. 5. Mild sigmoid diverticulosis.   01/01/2016 -  Anti-estrogen oral therapy   She was placed on tamoxifen. Had letrozole initially but switched to tamoxifen due to poor tolerance   01/15/2016 Tumor Marker   Patient's tumor was tested for the following markers: CA125 Results of the tumor marker test revealed 31.4   02/19/2016 Tumor Marker   Patient's tumor was tested for the following markers: CA125 Results of the tumor marker test revealed 36.5   05/13/2016 Imaging   No evidence of disease progression within the abdomen or pelvis. Previously noted small central mesenteric nodule and left external iliac node appear slightly smaller. 2. Stable changes of hepatic cirrhosis and portal hypertension with associated splenomegaly. No new or enlarging hepatic lesions are identified. 3. No acute findings.   05/13/2016 Tumor Marker   Patient's tumor was tested for the following markers: CA125 Results of the tumor marker test revealed 41.2   06/24/2016 Tumor Marker   Patient's tumor was tested for the following markers: CA125 Results of the tumor marker test revealed 47.3   08/20/2016 Imaging   1. The left external iliac lymph node is slightly increased in size compared to the prior exam, currently 1.1 cm and previously 0.9 cm in diameter. 2. Stable central mesenteric lymph node at 0.9 cm in diameter. 3. Stable trace free pelvic fluid and stable slight thickening along the right paracolic gutter, without well-defined peritoneal nodularity. 4. Other imaging findings of potential clinical significance: Subsegmental atelectasis or scarring in the lung bases. Hepatic cirrhosis. Left renal scarring. Mild  splenomegaly. Sigmoid colon diverticulosis. Impingement at L4-5 due to spondylosis and degenerative disc disease broad Schmorl' s nodes at L3-L4. Pelvic floor laxity   08/23/2016 Tumor Marker   Patient's tumor was tested for the following markers: CA125 Results of the tumor marker test revealed 80.2   09/04/2016 Imaging   LV EF: 60% -   65%   09/12/2016 Tumor Marker   Patient's tumor was tested for the following markers: CA125 Results of the tumor marker test revealed 98.5   09/12/2016 - 01/29/2018 Chemotherapy   The patient received Doxil and Avastin. Avastin was discontinued in Dec 2019 due to GI hemorrhage   09/26/2016 Tumor Marker   Patient's tumor was tested for the following markers: CA125 Results of the tumor marker test revealed 68.9   10/10/2016 Tumor Marker   Patient's tumor was tested for the following markers: CA125 Results of the tumor marker test revealed 65.6   11/07/2016 Tumor Marker   Patient's tumor was tested for the following markers: CA125 Results of the tumor marker test  revealed 46.4   11/29/2016 Imaging   Stable mild peritoneal thickening, suspicious for peritoneal carcinomatosis. No new or progressive disease identified. No evidence of ascites.  No significant change in 10 mm left external iliac and 8 mm mesenteric lymph nodes.  Stable hepatic cirrhosis, and splenomegaly consistent with portal venous hypertension.  Colonic diverticulosis, without radiographic evidence of diverticulitis.   12/02/2016 Imaging   Normal LV size with EF 55%. Basal inferior and basal inferoseptal hypokinesis. Normal RV size and systolic function. No significant valvular abnormalities.   12/05/2016 Tumor Marker   Patient's tumor was tested for the following markers: CA125 Results of the tumor marker test revealed 49.1   01/09/2017 Tumor Marker   Patient's tumor was tested for the following markers: CA125 Results of the tumor marker test revealed 47.2   02/06/2017 Tumor Marker    Patient's tumor was tested for the following markers: CA125 Results of the tumor marker test revealed 44.1   02/18/2017 Imaging   Stable mild peritoneal thickening. No new or progressive disease identified within the abdomen or pelvis.  Stable tiny sub-cm left external iliac and mesenteric lymph nodes.  Stable hepatic cirrhosis, and splenomegaly consistent with portal venous hypertension. No evidence of hepatic neoplasm.  Colonic diverticulosis, without radiographic evidence of diverticulitis.   03/06/2017 Tumor Marker   Patient's tumor was tested for the following markers: CA125 Results of the tumor marker test revealed 50.4   03/17/2017 Imaging   - Left ventricle: The cavity size was normal. Wall thickness was normal. Systolic function was normal. The estimated ejection fraction was in the range of 55% to 60%. Wall motion was normal; there were no regional wall motion abnormalities. Features are consistent with a pseudonormal left ventricular filling pattern, with concomitant abnormal relaxation and increased filling pressure (grade 2 diastolic dysfunction). - Mitral valve: There was mild regurgitation. - Left atrium: The atrium was mildly dilated   04/03/2017 Tumor Marker   Patient's tumor was tested for the following markers: CA125 Results of the tumor marker test revealed 47.2   05/14/2017 Imaging   CT scan of abdomen and pelvis Stable mild peritoneal thickening. No new or progressive disease within the abdomen or pelvis.  Stable hepatic cirrhosis. Stable splenomegaly, consistent with portal venous hypertension. No evidence of hepatic neoplasm.   05/15/2017 Tumor Marker   Patient's tumor was tested for the following markers: CA125 Results of the tumor marker test revealed 56.1   06/12/2017 Tumor Marker   Patient's tumor was tested for the following markers: CA125 Results of the tumor marker test revealed 49.2   07/10/2017 Tumor Marker   Patient's tumor was tested for the following  markers: CA125 Results of the tumor marker test revealed 46.3   08/07/2017 Tumor Marker   Patient's tumor was tested for the following markers: CA125 Results of the tumor marker test revealed 52.9   08/20/2017 Imaging   1. Mild omental/peritoneal haziness, unchanged. No evidence of new metastatic disease. 2. Cirrhosis with splenomegaly.   09/04/2017 Tumor Marker   Patient's tumor was tested for the following markers: CA125 Results of the tumor marker test revealed 48.5   11/13/2017 Tumor Marker   Patient's tumor was tested for the following markers: CA125 Results of the tumor marker test revealed 55.8   12/17/2017 Imaging   1. No findings to suggest metastatic disease in the abdomen or pelvis. 2. Severe hepatic cirrhosis with evidence of portal hypertension, as demonstrated by dilated portal vein, splenomegaly and portosystemic collateral pathways, including gastric and esophageal varices.  3. Colonic diverticulosis without evidence of acute diverticulitis at this time. 4. Additional incidental findings, as above.   01/16/2018 Tumor Marker   Patient's tumor was tested for the following markers: CA125 Results of the tumor marker test revealed 63.7   02/10/2018 Procedure   EGD - Recently bleeding grade III and large (> 5 mm) esophageal varices. Completely eradicated. Banded. - Portal hypertensive gastropathy. - Normal examined duodenum. - No specimens collected   02/13/2018 Tumor Marker   Patient's tumor was tested for the following markers: CA125 Results of the tumor marker test revealed 82.7   02/25/2018 Imaging   Bone density showed mild osteopenia   04/08/2018 Tumor Marker   Patient's tumor was tested for the following markers: CA125 Results of the tumor marker test revealed 82.7   04/08/2018 Imaging   1. No definite omental or peritoneal surface lesions. However, there are 2 slowly enlarging lymph nodes noted. One is in the small bowel mesentery and the other is in the left  deep pelvis. Could not exclude recurrent disease. 2. Stable advanced cirrhotic changes involving the liver with portal venous hypertension, portal venous collaterals, esophageal varices and splenomegaly. No worrisome hepatic lesions. 3. Diffuse wall thickening of the colon could suggest diffuse colitis or could be due to low albumin. Recommend correlation with any symptoms such as diarrhea.   05/21/2018 Tumor Marker   Patient's tumor was tested for the following markers: CA125 Results of the tumor marker test revealed 86   09/07/2018 Tumor Marker   Patient's tumor was tested for the following markers:CA-125 Results of the tumor marker test revealed 111   09/29/2018 Tumor Marker   Patient's tumor was tested for the following markers: CA-125 Results of the tumor marker test revealed 121.   09/29/2018 - 11/24/2018 Chemotherapy   The patient had carboplatin and gemzar for chemotherapy treatment.  Chemo was stopped due to allergic reaction to carboplatin   10/27/2018 Tumor Marker   Patient's tumor was tested for the following markers: CA-125 Results of the tumor marker test revealed 99.9   11/24/2018 Tumor Marker   Patient's tumor was tested for the following markers: CA-125 Results of the tumor marker test revealed 81.2   12/14/2018 Tumor Marker   Patient's tumor was tested for the following markers: CA-125 Results of the tumor marker test revealed 81.2   12/14/2018 Imaging   1. Mild improvement in peritoneal disease. Peritoneal nodule within the left posterior pelvis contiguous with the left side of vaginal cuff is slightly decreased in size in the interval. Within the right iliac fossa there is a small peritoneal nodule which has decreased in size in the interval. Central small bowel mesenteric lymph node is not significantly changed. Slight decrease in size left periaortic lymph node.    2. Morphologic features of the liver compatible with cirrhosis. Splenomegaly is noted which may reflect  portal venous hypertension.   12/21/2018 Tumor Marker   Patient's tumor was tested for the following markers: CA-125 Results of the tumor marker test revealed 64.2   12/21/2018 -  Chemotherapy   The patient had taxotere for chemotherapy treatment.     03/02/2019 Tumor Marker   Patient's tumor was tested for the following markers: CA-125. 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   No new or progressive metastatic disease within the abdomen or pelvis.   Hepatic cirrhosis and findings of portal venous hypertension. 1.1 cm low-attenuation lesion within  left hepatic lobe, which could represent a regenerative nodule or small hepatocellular carcinoma. Recommend abdomen MRI without and with contrast for further characterization.     04/13/2019 Tumor Marker   Patient's tumor was tested for the following markers: CA-125 Results of the tumor marker test revealed 36.8   05/26/2019 Tumor Marker   Patient's tumor was tested for the following markers: CA-125 Results of the tumor marker test revealed 31.6.   06/15/2019 Tumor Marker   Patient's tumor was tested for the following markers: CA-125 Results of the tumor marker test revealed 25.4   07/05/2019 Imaging   1. Stable CT examination. Stable peritoneal thickening in the pelvis and paracolic gutters. Stable solid left deep pelvic implant. Stable trace ascites. No new or progressive metastatic disease. 2. Stable cirrhosis. No discrete liver masses. 3. Stable mild-to-moderate splenomegaly. 4. Aortic Atherosclerosis (ICD10-I70.0).   07/27/2019 Tumor Marker   Patient's tumor was tested for the following markers: CA-125 Results of the tumor marker test revealed 35.4   08/17/2019 Tumor Marker   Patient's tumor was tested for the following markers: Ca-125 Results of the tumor marker test revealed 35.7.   09/17/2019 - 09/21/2019 Hospital Admission   She  was admitted due to esophageal variceal bleeding presents with hematemesis and melanotic stool with epigastric pain.  GI consulted, started on octreotide infusion.  Underwent EGD which showed minimal bleeding, remain on octreotide drip, PPI drip.  Hemoglobin trended down therefore received PRBC transfusion.  Over several days her bleeding subsided, hemodynamically remained stable.    09/17/2019 Procedure   EGD report - Esophageal mucosal scarring, from prior esophageal banding sessions. - Grade I esophageal varices. - Red blood in the cardia and in the gastric fundus. - Portal hypertensive gastropathy. - Normal duodenal bulb, first portion of the duodenum and second portion of the duodenum. 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 2. Otherwise stable CT exam. Stable peritoneal thickening within the bilateral deep pelvic peritoneum. Stable left deep pelvic implant.No evidence for progressed disease. 3. Stable splenomegaly. 4. Morphologic changes to the liver compatible with cirrhosis. Trace perihepatic and pelvic ascites.     REVIEW OF SYSTEMS:   Constitutional: Denies fevers, chills or abnormal weight loss Eyes: Denies blurriness of vision Ears, nose, mouth, throat, and face: Denies mucositis or sore throat Respiratory: Denies cough, dyspnea or wheezes Cardiovascular: Denies palpitation, chest discomfort or lower extremity swelling Gastrointestinal:  Denies nausea, heartburn or change in bowel habits Skin: Denies abnormal skin rashes Lymphatics: Denies new lymphadenopathy or easy  bruising Neurological:Denies numbness, tingling or new weaknesses Behavioral/Psych: Mood is stable, no new changes  All other systems were reviewed with the patient and are negative.  I have reviewed the past medical history, past surgical history, social history and family history with the patient and they are unchanged from previous note.  ALLERGIES:  is allergic to carboplatin, shellfish allergy, vancomycin, benadryl [diphenhydramine hcl], and penicillins.  MEDICATIONS:  Current Outpatient Medications  Medication Sig Dispense Refill  . cholecalciferol (VITAMIN D) 1000 units tablet Take 1,000 Units by mouth at bedtime.     Marland Kitchen dexamethasone (DECADRON) 4 MG tablet Start the day before Taxotere, take 1 tablet in the morning,  and then 1 tablet daily after chemo for 2 days. Pls dispense 30 tabs (Patient taking differently: Take 4 mg by mouth See admin instructions. Start the day before Taxotere, take 1 tablet in the morning, and then 1 tablet daily after chemo for 2 days. Pls dispense 30 tabs) 30 tablet 1  . gabapentin (NEURONTIN) 300 MG capsule TAKE 3 CAPSULES(900 MG) BY MOUTH TWICE DAILY 120 capsule 11  . HUMALOG MIX 75/25 KWIKPEN (75-25) 100 UNIT/ML Kwikpen Inject 22 Units into the skin 2 (two) times daily. Before breakfast & before supper Except when taking Chemo take 32 units  For three to five days    . HYDROcodone-acetaminophen (NORCO/VICODIN) 5-325 MG tablet Take 1 tablet by mouth every 6 (six) hours as needed for moderate pain. 60 tablet 0  . lidocaine-prilocaine (EMLA) cream Apply to Porta-cath  1-2 hours prior to access as directed. (Patient taking differently: Apply 1 application topically daily as needed (port access). ) 30 g 9  . nadolol (CORGARD) 20 MG tablet Take 1 tablet (20 mg total) by mouth daily. (Patient taking differently: Take 20 mg by mouth at bedtime. ) 90 tablet 3  . ondansetron (ZOFRAN) 8 MG tablet Take 1 tablet (8 mg total) by mouth every 8 (eight) hours as needed. 60  tablet 1  . pantoprazole (PROTONIX) 40 MG tablet Take 1 tablet (40 mg total) by mouth daily.    Marland Kitchen PROAIR HFA 108 (90 Base) MCG/ACT inhaler Inhale 2 puffs into the lungs every 4 (four) hours as needed for wheezing or shortness of breath.     . prochlorperazine (COMPAZINE) 10 MG tablet Take 1 tablet (10 mg total) by mouth every 6 (six) hours as needed (Nausea or vomiting). 30 tablet 1  . rotigotine (NEUPRO) 4 MG/24HR APPLY 1 PATCH EXTERNALLY TO THE SKIN DAILY 30 patch 6   No current facility-administered medications for this visit.    PHYSICAL EXAMINATION: ECOG PERFORMANCE STATUS: 1 - Symptomatic but completely ambulatory  Vitals:   01/11/20 1054  BP: 133/62  Pulse: 65  Resp: 18  Temp: (!) 97.5 F (36.4 C)  SpO2: 100%   Filed Weights   01/11/20 1054  Weight: 190 lb 3.2 oz (86.3 kg)    GENERAL:alert, no distress and comfortable NEURO: alert & oriented x 3 with fluent speech, no focal motor/sensory deficits  LABORATORY DATA:  I have reviewed the data as listed    Component Value Date/Time   NA 141 01/11/2020 1024   NA 142 02/06/2017 0742   K 3.9 01/11/2020 1024   K 3.9 02/06/2017 0742   CL 109 01/11/2020 1024   CO2 24 01/11/2020 1024   CO2 22 02/06/2017 0742   GLUCOSE 119 (H) 01/11/2020 1024   GLUCOSE 156 (H) 02/06/2017 0742   BUN 15 01/11/2020 1024   BUN 13.5 02/06/2017 0742   CREATININE 0.76 01/11/2020 1024   CREATININE 0.85 10/26/2019 0722   CREATININE 0.8 02/06/2017 0742   CALCIUM 8.8 (L) 01/11/2020 1024   CALCIUM 8.5 02/06/2017 0742   PROT 7.3 01/11/2020 1024   PROT 6.9 02/06/2017 0742   ALBUMIN 3.7 01/11/2020 1024   ALBUMIN 3.7 02/06/2017 0742   AST 27 01/11/2020 1024   AST 24 10/26/2019 0722   AST 47 (H) 02/06/2017 0742   ALT 27 01/11/2020 1024   ALT 17 10/26/2019 0722   ALT 54 02/06/2017 0742   ALKPHOS 117 01/11/2020 1024   ALKPHOS 130 02/06/2017 0742   BILITOT 0.8 01/11/2020 1024   BILITOT 0.7 10/26/2019 8101  BILITOT 0.65 02/06/2017 0742    GFRNONAA >60 01/11/2020 1024   GFRNONAA >60 10/26/2019 0722   GFRAA >60 11/09/2019 0953   GFRAA >60 10/26/2019 0722    No results found for: SPEP, UPEP  Lab Results  Component Value Date   WBC 3.9 (L) 01/11/2020   NEUTROABS 2.4 01/11/2020   HGB 12.4 01/11/2020   HCT 37.5 01/11/2020   MCV 90.1 01/11/2020   PLT 78 (L) 01/11/2020      Chemistry      Component Value Date/Time   NA 141 01/11/2020 1024   NA 142 02/06/2017 0742   K 3.9 01/11/2020 1024   K 3.9 02/06/2017 0742   CL 109 01/11/2020 1024   CO2 24 01/11/2020 1024   CO2 22 02/06/2017 0742   BUN 15 01/11/2020 1024   BUN 13.5 02/06/2017 0742   CREATININE 0.76 01/11/2020 1024   CREATININE 0.85 10/26/2019 0722   CREATININE 0.8 02/06/2017 0742      Component Value Date/Time   CALCIUM 8.8 (L) 01/11/2020 1024   CALCIUM 8.5 02/06/2017 0742   ALKPHOS 117 01/11/2020 1024   ALKPHOS 130 02/06/2017 0742   AST 27 01/11/2020 1024   AST 24 10/26/2019 0722   AST 47 (H) 02/06/2017 0742   ALT 27 01/11/2020 1024   ALT 17 10/26/2019 0722   ALT 54 02/06/2017 0742   BILITOT 0.8 01/11/2020 1024   BILITOT 0.7 10/26/2019 0722   BILITOT 0.65 02/06/2017 0742

## 2020-01-12 NOTE — Assessment & Plan Note (Signed)
Clinically, she have no signs or symptoms of active cancer Per previous discussion, I plan to repeat imaging study in January I will also order tumor marker for assessment If CT imaging show disease progression, we will resume treatment However, if CT imaging showed no evidence of active disease, we will continue treatment holiday

## 2020-01-12 NOTE — Assessment & Plan Note (Signed)
She has received recent IV iron She is not anemic today I do not recommend further IV iron infusion I will recheck in her next visit

## 2020-01-12 NOTE — Assessment & Plan Note (Signed)
She is taking chronic hydrocodone for chronic back pain I refill her prescription today We discussed narcotic refill policy

## 2020-01-12 NOTE — Assessment & Plan Note (Signed)
She has multifactorial pancytopenia, due to recent chemotherapy, liver disease with splenomegaly She does not need treatment for thrombocytopenia

## 2020-02-04 ENCOUNTER — Other Ambulatory Visit: Payer: Self-pay | Admitting: Hematology and Oncology

## 2020-02-09 ENCOUNTER — Other Ambulatory Visit: Payer: Self-pay

## 2020-02-09 ENCOUNTER — Encounter (HOSPITAL_COMMUNITY): Payer: Self-pay

## 2020-02-09 ENCOUNTER — Emergency Department (HOSPITAL_COMMUNITY)
Admission: EM | Admit: 2020-02-09 | Discharge: 2020-02-09 | Disposition: A | Discharge status: Home / Self Care | Payer: Medicare Other | Attending: Emergency Medicine | Admitting: Emergency Medicine

## 2020-02-09 DIAGNOSIS — E1140 Type 2 diabetes mellitus with diabetic neuropathy, unspecified: Secondary | ICD-10-CM | POA: Insufficient documentation

## 2020-02-09 DIAGNOSIS — Z955 Presence of coronary angioplasty implant and graft: Secondary | ICD-10-CM | POA: Insufficient documentation

## 2020-02-09 DIAGNOSIS — Z87891 Personal history of nicotine dependence: Secondary | ICD-10-CM | POA: Diagnosis not present

## 2020-02-09 DIAGNOSIS — S81802A Unspecified open wound, left lower leg, initial encounter: Secondary | ICD-10-CM | POA: Diagnosis not present

## 2020-02-09 DIAGNOSIS — E119 Type 2 diabetes mellitus without complications: Secondary | ICD-10-CM | POA: Insufficient documentation

## 2020-02-09 DIAGNOSIS — I110 Hypertensive heart disease with heart failure: Secondary | ICD-10-CM | POA: Diagnosis not present

## 2020-02-09 DIAGNOSIS — X58XXXA Exposure to other specified factors, initial encounter: Secondary | ICD-10-CM | POA: Diagnosis not present

## 2020-02-09 DIAGNOSIS — Z8543 Personal history of malignant neoplasm of ovary: Secondary | ICD-10-CM | POA: Diagnosis not present

## 2020-02-09 DIAGNOSIS — J45909 Unspecified asthma, uncomplicated: Secondary | ICD-10-CM | POA: Insufficient documentation

## 2020-02-09 DIAGNOSIS — Z85828 Personal history of other malignant neoplasm of skin: Secondary | ICD-10-CM | POA: Insufficient documentation

## 2020-02-09 DIAGNOSIS — I509 Heart failure, unspecified: Secondary | ICD-10-CM | POA: Diagnosis not present

## 2020-02-09 DIAGNOSIS — Z79899 Other long term (current) drug therapy: Secondary | ICD-10-CM | POA: Insufficient documentation

## 2020-02-09 NOTE — ED Notes (Signed)
Patient was able to ambulate without left leg bleeding. Will make NP aware.

## 2020-02-09 NOTE — Discharge Instructions (Signed)
Please keep the area clean with warm soapy water.  You may dry gently.  This area is likely to bleed slightly in the future.  If this happens you may put a small amount of gauze and hold pressure.  You may also wrap either an Ace bandage or some coban which we may be able to supply you with to the area.  You may always return to the ER should you be unable to control bleeding

## 2020-02-09 NOTE — ED Provider Notes (Signed)
[facility] DEPT Provider Note   CSN: [id number] Arrival date & time: 02/09/20  2409     History Chief Complaint  Patient presents with  . Laceration    Olivia Benson is a 74 y.o. female.  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  Patient states that she was shaving her legs approximately 1 hour ago in the shower this morning when she "nicked "a varicose vein in her left shin. She states that she tried applying pressure and wrapping the area however it continued to bleed. She came to the ER for further evaluation.  She denies any lightheadedness, dizziness, nausea, vomiting. She denies any other injuries. Denies any aggravating or mitigating factors. She took no other medications prior to arrival. Location of the injury is her left shin.     Past Medical History:  Diagnosis Date  . Asthma   . Cirrhosis ([place])   . Complication of anesthesia    slow to wake up / n & v  . Diabetes mellitus   . Elevated LFTs   . Family history of adverse reaction to anesthesia    multiple family members have had problems such as slow to wake / "flat lined" /  ventilator    . Frequency of urination   . Glaucoma, narrow-angle   . History of skin cancer   . Hypertension   . Non-ST elevated myocardial infarction (non-STEMI) [facility]) Sept 2012   Mild elevation in troponin and CK MB but no evidence of coronary disease at time of cath; per patient " i had undiagnosed heart event but not a heart attack"   . Obesity   . 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 Overactive bladder   . PONV (postoperative nausea and vomiting)    no problems with feb  egd   . Portal hypertension ([place])   .  Restless leg syndrome   . Tingling    hands and feet    Patient Active Problem List   Diagnosis Date Noted  . 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 Acute GI bleeding 09/17/2019  . Bilateral edema of lower extremity 04/14/2019  . Preventive measure 03/03/2019  . Syncope, vasovagal 01/04/2019  . Phlebitis after infusion 01/04/2019  . Acute lower UTI 01/04/2019  . Peritoneal carcinomatosis ([place]) 01/04/2019  . AKI (acute kidney injury) ([place]) 12/31/2018  . SAH (subarachnoid hemorrhage) ([place]) 12/31/2018  . Antineoplastic chemotherapy induced pancytopenia ([place]) 12/31/2018  . Severe sepsis with septic shock ([place]) 12/31/2018  . Diarrhea 12/31/2018  . Redness and swelling of upper arm, right 12/31/2018  . Hypocalcemia 12/31/2018  . Coagulopathy ([place])   . Mucositis due to antineoplastic therapy   . Esophageal varices ([place]) 03/23/2018  . Chronic diastolic CHF (congestive heart failure) ([place]) 03/09/2018  . Fall 03/08/2018  . Displaced fracture of distal end of right fibula 03/08/2018  . GIB (gastrointestinal bleeding) 02/09/2018  . Obesity due to excess calories 03/20/2017  . Cardiomyopathy ([place]) 01/24/2017  . Sinus congestion 01/24/2017  . 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 Goals of care, counseling/discussion 09/03/2016  . Thrombocytopenia ([place]) 04/15/2016  . Vasomotor instability 01/17/2016  . Osteopenia determined by x-ray 12/22/2015  . Encounter for antineoplastic chemotherapy 12/09/2015  .  Chemotherapy-induced neuropathy ([place]) 10/01/2015  . Elevated tumor markers 10/01/2015  . Obesity (BMI 35.0-39.9 without comorbidity) 05/31/2015  . Port catheter in place 03/29/2015  . Iron deficiency anemia 03/05/2015  . Low back pain 03/05/2015  . Neuropathic pain of foot 11/21/2014  . Anemia associated with chemotherapy 07/03/2014  . Diabetes mellitus without complication ([place]) 60/45/4098  . Portacath in place 05/10/2014  . Leukopenia due to  antineoplastic chemotherapy ([place]) 05/10/2014  . Splenomegaly 05/10/2014  . Primary peritoneal carcinomatosis ([place]) 05/08/2014  . 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 Genetic testing 03/16/2014  . Neutropenic fever ([place]) 01/27/2014  . Pancytopenia, acquired ([place]) 01/27/2014  . Fever and neutropenia ([place]) 01/27/2014  . Asthma 01/27/2014  . Malignant ascites 01/25/2014  . Chemotherapy induced neutropenia ([place]) 01/25/2014  . Chemotherapy-induced thrombocytopenia 01/25/2014  . RLS (restless legs syndrome) 01/25/2014  . Liver cirrhosis secondary to NASH ([place]) 01/02/2014  . Nonalcoholic steatohepatitis ([facility]) 01/02/2014  . 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 Diabetes mellitus with diabetic neuropathy, with long-term current use of insulin ([place]) 12/03/2013  . Ascites 11/15/2013  . Elevated cancer antigen 125 (CA-125) 11/15/2013  . Cough variant asthma vs UACS 09/02/2013  . Essential hypertension 09/02/2013  . Dyspnea 10/22/2012  . Fatty liver disease, nonalcoholic 11/91/4782  . S/P cardiac cath 11/06/2010  . Personal history of surgery to heart and great vessels, presenting hazards to health 11/06/2010    Past Surgical History:  Procedure Laterality Date  . ABDOMINAL ULTRASOUND  Sept 2012   No gallbladder disease, + fatty liver  . CARDIAC CATHETERIZATION  10/25/2010   EF 60%; Normal coronaries  . COLONOSCOPY  10/18/2005  . COLONOSCOPY WITH PROPOFOL N/A 09/28/2018   Procedure: COLONOSCOPY WITH PROPOFOL;  Surgeon: Ronnette Juniper, MD;  Location: [facility];  Service: Gastroenterology;  Laterality: N/A;  . COSMETIC SURGERY  2003  . DENTAL SURGERY    . ESOPHAGEAL BANDING  02/10/2018   Procedure: ESOPHAGEAL BANDING;  Surgeon: Ronnette Juniper, MD;  Location: [facility];  Service: Gastroenterology;;  . ESOPHAGEAL BANDING N/A 03/23/2018   Procedure: ESOPHAGEAL BANDING;  Surgeon: Ronnette Juniper, MD;  [facility]: [facility];  Service: Gastroenterology;  Laterality: N/A;  .  ESOPHAGEAL BANDING N/A 09/28/2018   Procedure: ESOPHAGEAL BANDING;  Surgeon: Ronnette Juniper, MD;  [facility]: [facility];  Service: Gastroenterology;  Laterality: N/A;  . ESOPHAGEAL BANDING  03/01/2019   Procedure: ESOPHAGEAL BANDING;  Surgeon: Ronnette Juniper, MD;  [facility]: [facility];  Service: Gastroenterology;;  . ESOPHAGOGASTRODUODENOSCOPY N/A 03/23/2018   Procedure: ESOPHAGOGASTRODUODENOSCOPY (EGD);  Surgeon: Ronnette Juniper, MD;  Location: [facility];  Service: Gastroenterology;  Laterality: N/A;  . ESOPHAGOGASTRODUODENOSCOPY N/A 09/17/2019   Procedure: ESOPHAGOGASTRODUODENOSCOPY (EGD);  Surgeon: Arta Silence, MD;  Location: [facility];  Service: Endoscopy;  Laterality: N/A;  . ESOPHAGOGASTRODUODENOSCOPY (EGD) WITH PROPOFOL N/A 02/10/2018   Procedure: ESOPHAGOGASTRODUODENOSCOPY (EGD) WITH PROPOFOL;  Surgeon: Ronnette Juniper, MD;  [facility];  Service: Gastroenterology;  Laterality: N/A;  . ESOPHAGOGASTRODUODENOSCOPY (EGD) WITH PROPOFOL N/A 09/28/2018   Procedure: ESOPHAGOGASTRODUODENOSCOPY (EGD) WITH PROPOFOL;  Surgeon: Ronnette Juniper, MD;  [facility];  Service: Gastroenterology;  Laterality: N/A;  . ESOPHAGOGASTRODUODENOSCOPY (EGD) WITH PROPOFOL N/A 03/01/2019   Procedure: ESOPHAGOGASTRODUODENOSCOPY (EGD) WITH PROPOFOL with banding;  Surgeon: Ronnette Juniper, MD;  [facility];  Service: Gastroenterology;  Laterality: N/A;  . ESOPHAGOGASTRODUODENOSCOPY (EGD) WITH PROPOFOL N/A 05/24/2019   Procedure: ESOPHAGOGASTRODUODENOSCOPY (EGD) WITH PROPOFOL;  Surgeon: Ronnette Juniper, MD;  [facility];  Service: Gastroenterology;  Laterality: N/A;  . EYE SURGERY Bilateral 09/19/14  R eye, 08/2014 L eye   open angles in both eyes d/t narrow angle glaucoma  . GASTRIC VARICES BANDING N/A 05/24/2019   Procedure: GASTRIC VARICES BANDING;  Surgeon: Ronnette Juniper, MD;  [facility];  Service: Gastroenterology;  Laterality: N/A;  . LAPAROTOMY N/A 03/29/2014   Procedure: EXPLORATORY  LAPAROTOMY;  Surgeon: Everitt Amber, MD;  Location: [facility];  Service: Gynecology;  Laterality: N/A;  . ORIF ANKLE FRACTURE Right 03/09/2018   Procedure: OPEN REDUCTION INTERNAL FIXATION (ORIF) RIGHT LATERAL MALLEOLUS;  Surgeon: Leandrew Koyanagi, MD;  Location: [facility];  Service: Orthopedics;  Laterality: Right;  . PARTIAL HYSTERECTOMY  1988  . POLYPECTOMY  09/28/2018   Procedure: POLYPECTOMY;  Surgeon: Ronnette Juniper, MD;  [facility];  Service: Gastroenterology;;  . SALPINGOOPHORECTOMY Bilateral 03/29/2014   Procedure: BILATERAL SALPINGO OOPHORECTOMY, OMENTECTOMY, DEBULKING;  Surgeon: Everitt Amber, MD;  Location: [facility];  Service: Gynecology;  Laterality: Bilateral;  . TONSILLECTOMY  1971  . TUBAL LIGATION    . WH-MAMMOGRAPHY  11/24/2009     OB History   No obstetric history on file.     Family History  Problem Relation Age of Onset  . Asthma Mother   . Diabetes Mother   . Hypertension Mother   . Allergies Mother   . Rheum arthritis Mother   . Bladder Cancer Father 24       former heavy smoker  . Hypertension Father   . Stroke Father   . Breast cancer Sister 32  . Diabetes Sister        x 3  . Allergies Sister   . Asthma Sister        x 2   . Breast cancer Maternal Aunt 85  . Breast cancer Cousin 107       (maternal) bilateral cancer  . Aneurysm Son 20  . Diabetes Brother   . Allergies Brother   . Asthma Brother     Social History   Tobacco Use  . Smoking status: Former Smoker    Packs/day: 1.00    Years: 1.00    Pack years: 1.00    Types: Cigarettes    Quit date: 02/11/1982    Years since quitting: 38.0  . Smokeless tobacco: Never Used  Vaping Use  . Vaping Use: Never used  Substance Use Topics  . Alcohol use: No    Alcohol/week: 0.0 standard drinks  . Drug use: No    Home Medications Prior to Admission medications   Medication Sig Start Date End Date Taking? Authorizing Provider  cholecalciferol (VITAMIN D) 1000 units tablet Take 1,000 Units by mouth at  bedtime.     [provider]  dexamethasone (DECADRON) 4 MG tablet Start the day before Taxotere, take 1 tablet in the morning, and then 1 tablet daily after chemo for 2 days. Pls dispense 30 tabs Patient taking differently: Take 4 mg by mouth See admin instructions. Start the day before Taxotere, take 1 tablet in the morning, and then 1 tablet daily after chemo for 2 days. Pls dispense 30 tabs 12/15/18   [name], MD  gabapentin (NEURONTIN) 300 MG capsule TAKE 2 CAPSULES(600 MG) BY MOUTH TWICE DAILY 02/04/20   [name], MD  HUMALOG MIX 75/25 KWIKPEN (75-25) 100 UNIT/ML Kwikpen Inject 22 Units into the skin 2 (two) times daily. Before breakfast & before supper Except when taking Chemo take 32 units  For three to five days 02/19/19   [provider]  HYDROcodone-acetaminophen (NORCO/VICODIN) 5-325 MG tablet  Take 1 tablet by mouth every 6 (six) hours as needed for moderate pain. 01/11/20   [name], MD  lidocaine-prilocaine (EMLA) cream Apply to Porta-cath  1-2 hours prior to access as directed. Patient taking differently: Apply 1 application topically daily as needed (port access).  11/13/17   [name], MD  nadolol (CORGARD) 20 MG tablet Take 1 tablet (20 mg total) by mouth daily. Patient taking differently: Take 20 mg by mouth at bedtime.  03/23/18 10/25/19  Ronnette Juniper, MD  ondansetron (ZOFRAN) 8 MG tablet Take 1 tablet (8 mg total) by mouth every 8 (eight) hours as needed. 01/11/20   [name], MD  pantoprazole (PROTONIX) 40 MG tablet Take 1 tablet (40 mg total) by mouth daily. 07/09/18   [name], MD  PROAIR HFA 108 (90 Base) MCG/ACT inhaler Inhale 2 puffs into the lungs every 4 (four) hours as needed for wheezing or shortness of breath.  11/24/18   [provider]  prochlorperazine (COMPAZINE) 10 MG tablet Take 1 tablet (10 mg total) by mouth every 6 (six) hours as needed (Nausea or vomiting). 09/18/18   [name], MD  rotigotine (NEUPRO) 4 MG/24HR APPLY 1 PATCH  EXTERNALLY TO THE SKIN DAILY 10/28/19   Tat, Eustace Quail, DO    Allergies    Carboplatin, Shellfish allergy, Vancomycin, Benadryl [diphenhydramine hcl], and Penicillins  Review of Systems   Review of Systems  Constitutional: Negative for chills and fever.  HENT: Negative for congestion.   Eyes: Negative for pain.  Respiratory: Negative for cough and shortness of breath.   Cardiovascular: Negative for chest pain and leg swelling.  Gastrointestinal: Negative for abdominal pain and vomiting.  Genitourinary: Negative for dysuria.  Musculoskeletal: Negative for myalgias.  Skin: Positive for wound. Negative for rash.  Neurological: Negative for dizziness and headaches.    Physical Exam Updated Vital Signs BP (!) 147/72 (BP Location: Right Arm)   Pulse 67   Temp 97.9 F (36.6 C) (Oral)   Resp 17   Ht 5' 1"  (1.549 m)   Wt 88 kg   SpO2 97%   BMI 36.66 kg/m   Physical Exam Vitals and nursing note reviewed.  Constitutional:      General: She is not in acute distress.    Appearance: Normal appearance. She is not ill-appearing.     Comments: Pleasant well-appearing 74 year old.  In no acute distress.  Sitting comfortably in bed.  Able answer questions appropriately follow commands. No increased work of breathing. Speaking in full sentences.  HENT:     Head: Normocephalic and atraumatic.  Eyes:     General: No scleral icterus.       Right eye: No discharge.        Left eye: No discharge.     Conjunctiva/sclera: Conjunctivae normal.  Pulmonary:     Effort: Pulmonary effort is normal.     Breath sounds: No stridor.  Musculoskeletal:     Comments: Bilateral lower extremities are symmetric without any calf tenderness.  There are diffuse varicosities.  Skin:    General: Skin is warm and dry.     Comments: Small 1 mm in diameter skin avulsion to the left anterior shin. Nonbleeding. There is some dried blood surrounding the area.  Neurological:     Mental Status: She is alert and  oriented to person, place, and time. Mental status is at baseline.     ED Results / Procedures / Treatments   Labs (all labs ordered are listed, but only abnormal  results are displayed) Labs Reviewed - No data to display  EKG None  Radiology No results found.  Procedures Procedures (including critical care time)  Medications Ordered in ED Medications - No data to display  ED Course  I have reviewed the triage vital signs and the nursing notes.  Pertinent labs & imaging results that were available during my care of the patient were reviewed by me and considered in my medical decision making (see chart for details).    MDM Rules/Calculators/A&P                           Patient is 74 year old female with past medical history detailed above presented today for left shin laceration in the shower this morning when she was shaving her legs.  Patient does have a minuscule skin avulsion on her left shin however it has stopped bleeding since she arrived in the ER.  Patient was ambulated in ER and has had no continued bleeding.  Does appear to be overlying an area of varicosities.  Patient given wound care precautions and instructions.  She will follow up with her primary care doctor.  Also her oncologist.  Notably she does have a history of low platelets.  Although her most recent numbers are not severely low.  And her lowest numbers from several months ago were in the mid 78s. She is not on any blood thinners.  She has no bony tenderness.  I have low suspicion for fracture or other deeper injuries.  She did not fall or injure himself.  I discussed this case my doing physician prior to discharge.  Patient given return precautions.  Final Clinical Impression(s) / ED Diagnoses Final diagnoses:  Wound of left lower extremity, initial encounter    Rx / DC Orders ED Discharge Orders    None       Tedd Sias, [place] 02/09/20 3295    Daleen Bo, MD 02/11/20 [phone]

## 2020-02-09 NOTE — ED Triage Notes (Signed)
Pt BIB EMS. Pt was shaving and cut her left leg and was unable to control the bleeding. Pt hx CA.

## 2020-02-22 ENCOUNTER — Other Ambulatory Visit: Payer: Self-pay

## 2020-02-22 ENCOUNTER — Encounter (HOSPITAL_COMMUNITY): Payer: Self-pay

## 2020-02-22 ENCOUNTER — Ambulatory Visit (HOSPITAL_COMMUNITY)
Admission: RE | Admit: 2020-02-22 | Discharge: 2020-02-22 | Disposition: A | Discharge status: Home / Self Care | Payer: Medicare Other | Source: Ambulatory Visit | Attending: Hematology and Oncology | Admitting: Hematology and Oncology

## 2020-02-22 ENCOUNTER — Inpatient Hospital Stay: Payer: Medicare Other | Attending: Hematology and Oncology

## 2020-02-22 ENCOUNTER — Inpatient Hospital Stay: Payer: Medicare Other

## 2020-02-22 DIAGNOSIS — D696 Thrombocytopenia, unspecified: Secondary | ICD-10-CM | POA: Diagnosis not present

## 2020-02-22 DIAGNOSIS — D509 Iron deficiency anemia, unspecified: Secondary | ICD-10-CM | POA: Diagnosis not present

## 2020-02-22 DIAGNOSIS — K7460 Unspecified cirrhosis of liver: Secondary | ICD-10-CM | POA: Insufficient documentation

## 2020-02-22 DIAGNOSIS — C569 Malignant neoplasm of unspecified ovary: Secondary | ICD-10-CM

## 2020-02-22 DIAGNOSIS — E119 Type 2 diabetes mellitus without complications: Secondary | ICD-10-CM | POA: Insufficient documentation

## 2020-02-22 DIAGNOSIS — K7581 Nonalcoholic steatohepatitis (NASH): Secondary | ICD-10-CM | POA: Diagnosis not present

## 2020-02-22 DIAGNOSIS — C7951 Secondary malignant neoplasm of bone: Secondary | ICD-10-CM | POA: Insufficient documentation

## 2020-02-22 DIAGNOSIS — C482 Malignant neoplasm of peritoneum, unspecified: Secondary | ICD-10-CM | POA: Insufficient documentation

## 2020-02-22 HISTORY — DX: Heart failure, unspecified: I50.9

## 2020-02-22 LAB — CBC WITH DIFFERENTIAL/PLATELET
Abs Immature Granulocytes: 0.01 10*3/uL (ref 0.00–0.07)
Basophils Absolute: 0 10*3/uL (ref 0.0–0.1)
Basophils Relative: 0 %
Eosinophils Absolute: 0.1 10*3/uL (ref 0.0–0.5)
Eosinophils Relative: 3 %
HCT: 36.3 % (ref 36.0–46.0)
Hemoglobin: 12.2 g/dL (ref 12.0–15.0)
Immature Granulocytes: 0 %
Lymphocytes Relative: 28 %
Lymphs Abs: 1 10*3/uL (ref 0.7–4.0)
MCH: 31.3 pg (ref 26.0–34.0)
MCHC: 33.6 g/dL (ref 30.0–36.0)
MCV: 93.1 fL (ref 80.0–100.0)
Monocytes Absolute: 0.3 10*3/uL (ref 0.1–1.0)
Monocytes Relative: 7 %
Neutro Abs: 2.3 10*3/uL (ref 1.7–7.7)
Neutrophils Relative %: 62 %
Platelets: 81 10*3/uL — ABNORMAL LOW (ref 150–400)
RBC: 3.9 MIL/uL (ref 3.87–5.11)
RDW: 15.1 % (ref 11.5–15.5)
WBC: 3.7 10*3/uL — ABNORMAL LOW (ref 4.0–10.5)
nRBC: 0 % (ref 0.0–0.2)

## 2020-02-22 LAB — IRON AND TIBC
Iron: 57 ug/dL (ref 41–142)
Saturation Ratios: 19 % — ABNORMAL LOW (ref 21–57)
TIBC: 308 ug/dL (ref 236–444)
UIBC: 250 ug/dL (ref 120–384)

## 2020-02-22 LAB — COMPREHENSIVE METABOLIC PANEL
ALT: 32 U/L (ref 0–44)
AST: 30 U/L (ref 15–41)
Albumin: 3.6 g/dL (ref 3.5–5.0)
Alkaline Phosphatase: 126 U/L (ref 38–126)
Anion gap: 7 (ref 5–15)
BUN: 19 mg/dL (ref 8–23)
CO2: 26 mmol/L (ref 22–32)
Calcium: 8.8 mg/dL — ABNORMAL LOW (ref 8.9–10.3)
Chloride: 109 mmol/L (ref 98–111)
Creatinine, Ser: 0.82 mg/dL (ref 0.44–1.00)
GFR, Estimated: >60 mL/min (ref >60)
Glucose, Bld: 110 mg/dL — ABNORMAL HIGH (ref 70–99)
Potassium: 4.2 mmol/L (ref 3.5–5.1)
Sodium: 142 mmol/L (ref 135–145)
Total Bilirubin: 0.6 mg/dL (ref 0.3–1.2)
Total Protein: 7.4 g/dL (ref 6.5–8.1)

## 2020-02-22 LAB — FERRITIN: Ferritin: 137 ng/mL (ref 11–307)

## 2020-02-22 MED ORDER — IOHEXOL 9 MG/ML PO SOLN
500.0000 mL | ORAL | Status: AC
  Administered 2020-02-22: 500 mL via ORAL

## 2020-02-22 MED ORDER — SODIUM CHLORIDE 0.9% FLUSH
10.0000 mL | Freq: Once | INTRAVENOUS | Status: AC
  Administered 2020-02-22: 10 mL
  Filled 2020-02-22: qty 10

## 2020-02-22 MED ORDER — HEPARIN SOD (PORK) LOCK FLUSH 100 UNIT/ML IV SOLN
INTRAVENOUS | Status: AC
  Filled 2020-02-22: qty 5

## 2020-02-22 MED ORDER — HEPARIN SOD (PORK) LOCK FLUSH 100 UNIT/ML IV SOLN
500.0000 [IU] | Freq: Once | INTRAVENOUS | Status: AC
  Administered 2020-02-22: 500 [IU] via INTRAVENOUS

## 2020-02-22 MED ORDER — IOHEXOL 300 MG/ML  SOLN
100.0000 mL | Freq: Once | INTRAMUSCULAR | Status: AC | PRN
  Administered 2020-02-22: 100 mL via INTRAVENOUS

## 2020-02-22 NOTE — Patient Instructions (Signed)
Implanted [facility] Guide An implanted port is a device that is placed under the skin. It is usually placed in the chest. The device can be used to give IV medicine, to take blood, or for dialysis. You may have an implanted port if:  You need IV medicine that would be irritating to the small veins in your hands or arms.  You need IV medicines, such as antibiotics, for a long period of time.  You need IV nutrition for a long period of time.  You need dialysis. When you have a port, your health care provider can choose to use the port instead of veins in your arms for these procedures. You may have fewer limitations when using a port than you would if you used other types of long-term IVs, and you will likely be able to return to normal activities after your incision heals. An implanted port has two main parts:  Reservoir. The reservoir is the part where a needle is inserted to give medicines or draw blood. The reservoir is round. After it is placed, it appears as a small, raised area under your skin.  Catheter. The catheter is a thin, flexible tube that connects the reservoir to a vein. Medicine that is inserted into the reservoir goes into the catheter and then into the vein. How is my port accessed? To access your port:  A numbing cream may be placed on the skin over the port site.  Your health care provider will put on a mask and sterile gloves.  The skin over your port will be cleaned carefully with a germ-killing soap and allowed to dry.  Your health care provider will gently pinch the port and insert a needle into it.  Your health care provider will check for a blood return to make sure the port is in the vein and is not clogged.  If your port needs to remain accessed to get medicine continuously (constant infusion), your health care provider will place a clear bandage (dressing) over the needle site. The dressing and needle will need to be changed every week, or as told by your  health care provider. What is flushing? Flushing helps keep the port from getting clogged. Follow instructions from your health care provider about how and when to flush the port. Ports are usually flushed with saline solution or a medicine called heparin. The need for flushing will depend on how the port is used:  If the port is only used from time to time to give medicines or draw blood, the port may need to be flushed: ? Before and after medicines have been given. ? Before and after blood has been drawn. ? As part of routine maintenance. Flushing may be recommended every 4-6 weeks.  If a constant infusion is running, the port may not need to be flushed.  Throw away any syringes in a disposal container that is meant for sharp items (sharps container). You can buy a sharps container from a pharmacy, or you can make one by using an empty hard plastic bottle with a cover. How long will my port stay implanted? The port can stay in for as long as your health care provider thinks it is needed. When it is time for the port to come out, a surgery will be done to remove it. The surgery will be similar to the procedure that was done to put the port in. Follow these instructions at home:  Flush your port as told by your health care  provider.  If you need an infusion over several days, follow instructions from your health care provider about how to take care of your port site. Make sure you: ? Wash your hands with soap and water before you change your dressing. If soap and water are not available, use alcohol-based hand sanitizer. ? Change your dressing as told by your health care provider. ? Place any used dressings or infusion bags into a plastic bag. Throw that bag in the trash. ? Keep the dressing that covers the needle clean and dry. Do not get it wet. ? Do not use scissors or sharp objects near the tube. ? Keep the tube clamped, unless it is being used.  Check your port site every day for signs  of infection. Check for: ? Redness, swelling, or pain. ? Fluid or blood. ? Pus or a bad smell.  Protect the skin around the port site. ? Avoid wearing bra straps that rub or irritate the site. ? Protect the skin around your port from seat belts. Place a soft pad over your chest if needed.  Bathe or shower as told by your health care provider. The site may get wet as long as you are not actively receiving an infusion.  Return to your normal activities as told by your health care provider. Ask your health care provider what activities are safe for you.  Carry a medical alert card or wear a medical alert bracelet at all times. This will let health care providers know that you have an implanted port in case of an emergency.   Get help right away if:  You have redness, swelling, or pain at the port site.  You have fluid or blood coming from your port site.  You have pus or a bad smell coming from the port site.  You have a fever. Summary  Implanted ports are usually placed in the chest for long-term IV access.  Follow instructions from your health care provider about flushing the port and changing bandages (dressings).  Take care of the area around your port by avoiding clothing that puts pressure on the area, and by watching for signs of infection.  Protect the skin around your port from seat belts. Place a soft pad over your chest if needed.  Get help right away if you have a fever or you have redness, swelling, pain, drainage, or a bad smell at the port site. This information is not intended to replace advice given to you by your health care provider. Make sure you discuss any questions you have with your health care provider. Document Revised: 06/14/2019 Document Reviewed: 06/14/2019 [employer] Patient Education  [employer].

## 2020-02-23 ENCOUNTER — Inpatient Hospital Stay (HOSPITAL_BASED_OUTPATIENT_CLINIC_OR_DEPARTMENT_OTHER): Payer: Medicare Other | Admitting: Hematology and Oncology

## 2020-02-23 ENCOUNTER — Other Ambulatory Visit: Payer: Self-pay

## 2020-02-23 ENCOUNTER — Encounter: Payer: Self-pay | Admitting: Hematology and Oncology

## 2020-02-23 DIAGNOSIS — D509 Iron deficiency anemia, unspecified: Secondary | ICD-10-CM

## 2020-02-23 DIAGNOSIS — K7581 Nonalcoholic steatohepatitis (NASH): Secondary | ICD-10-CM | POA: Diagnosis not present

## 2020-02-23 DIAGNOSIS — D696 Thrombocytopenia, unspecified: Secondary | ICD-10-CM

## 2020-02-23 DIAGNOSIS — Z794 Long term (current) use of insulin: Secondary | ICD-10-CM

## 2020-02-23 DIAGNOSIS — C482 Malignant neoplasm of peritoneum, unspecified: Secondary | ICD-10-CM

## 2020-02-23 DIAGNOSIS — E1140 Type 2 diabetes mellitus with diabetic neuropathy, unspecified: Secondary | ICD-10-CM

## 2020-02-23 DIAGNOSIS — K7460 Unspecified cirrhosis of liver: Secondary | ICD-10-CM

## 2020-02-23 DIAGNOSIS — M899 Disorder of bone, unspecified: Secondary | ICD-10-CM

## 2020-02-23 LAB — CA 125: Cancer Antigen (CA) 125: 30.1 U/mL (ref 0.0–38.1)

## 2020-02-23 MED ORDER — HYDROCODONE-ACETAMINOPHEN 5-325 MG PO TABS: 1.0000 | ORAL_TABLET | Freq: Four times a day (QID) | ORAL | 0 refills | Status: DC | PRN

## 2020-02-23 NOTE — Assessment & Plan Note (Signed)
She has multifactorial pancytopenia, due to recent chemotherapy, liver disease with splenomegaly She does not need treatment for thrombocytopenia

## 2020-02-23 NOTE — Assessment & Plan Note (Signed)
CT imaging commented on the bone lesion Retrospectively looking, the bone lesion has been present in prior imaging I do not believe this is the cause of her chronic back pain We discussed the risk and benefits of pursuing additional imaging studies such as bone scan or MRI The patient would like to be observe for now I refill her prescription pain medicine today

## 2020-02-23 NOTE — Assessment & Plan Note (Signed)
She has gained a lot of weight since last time I saw her We discussed the importance of tight control diabetes, lifestyle modification and dietary control

## 2020-02-23 NOTE — Assessment & Plan Note (Signed)
I have reviewed multiple imaging studies with the patient and her husband Overall, I do not see any definitive signs of cancer recurrence Her tumor marker is stable I plan to continue observation and continue on supportive care I plan to see her again in 6 weeks for further follow-up, blood draw and port flushes

## 2020-02-23 NOTE — Assessment & Plan Note (Signed)
CT imaging showed persistent liver cirrhosis She is not symptomatic We discussed the importance of risk factor modification and tight control of her diabetes

## 2020-02-23 NOTE — Assessment & Plan Note (Signed)
She has recurrent GI bleed causing iron deficiency anemia Her recent iron studies are adequate

## 2020-02-23 NOTE — Progress Notes (Signed)
[place] OFFICE PROGRESS NOTE  Patient Care Team: Gregor Hams, FNP as PCP - General (Family Medicine) Tat, Eustace Quail, DO as Consulting Physician (Neurology)  ASSESSMENT & PLAN:  Primary peritoneal carcinomatosis [facility]) I have reviewed multiple imaging studies with the patient and her husband Overall, I do not see any definitive signs of cancer recurrence Her tumor marker is stable I plan to continue observation and continue on supportive care I plan to see her again in 6 weeks for further follow-up, blood draw and port flushes  Liver cirrhosis secondary to NASH [facility] [place]) CT imaging showed persistent liver cirrhosis She is not symptomatic We discussed the importance of risk factor modification and tight control of her diabetes  Thrombocytopenia ([place]) She has multifactorial pancytopenia, due to recent chemotherapy, liver disease with splenomegaly She does not need treatment for thrombocytopenia  Iron deficiency anemia She has recurrent GI bleed causing iron deficiency anemia Her recent iron studies are adequate  Diabetes mellitus with diabetic neuropathy, with long-term current use of insulin ([place]) She has gained a lot of weight since last time I saw her We discussed the importance of tight control diabetes, lifestyle modification and dietary control  Bone lesion CT imaging commented on the bone lesion Retrospectively looking, the bone lesion has been present in prior imaging I do not believe this is the cause of her chronic back pain We discussed the risk and benefits of pursuing additional imaging studies such as bone scan or MRI The patient would like to be observe for now I refill her prescription pain medicine today   No orders of the defined types were placed in this encounter.   All questions were answered. The patient knows to call the clinic with any problems, questions or concerns. The total time spent in the appointment was 40 minutes encounter  with patients including review of chart and various tests results, discussions about plan of care and coordination of care plan   [name], MD 02/23/2020 3:13 PM  INTERVAL HISTORY: Please see below for problem oriented charting. 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 Had allergic reaction to carboplatin. Avastin was stopped due to GI hemorrhage   Primary peritoneal carcinomatosis ([employer])  09/06/2011 Imaging   CT findings consistent with cirrhotic changes involving the liver.  No worrisome liver mass.  There are associated portal venous collaterals and splenomegaly consistent with portal venous hypertension. 2.  No other significant upper abdominal findings   11/08/2013 Imaging   US showed ascites   11/08/2013 Initial Diagnosis   Patient had ~ 2 months of some urinary incontinence, saw Dr Ronita Hipps in 10-2013, with pelvic mass on exam    11/12/2013 Imaging   There are findings of progressive hepatic cirrhosis with a large amount of ascites and mild splenomegaly. There are venous collaterals present. 2. There is abnormal thickening of the peritoneal surface along the left mid and lower abdominal wall. There is abnormal soft tissue density in the pelvis as well which could reflect the clinically suspected ovarian malignancy but the findings are nondiagnostic. 3. There is a new small left pleural effusion. 4. There is no acute bowel abnormality.  11/18/2013 Imaging   Successful ultrasound guided diagnostic and therapeutic paracentesis yielding 4.1 liters of ascites.    11/18/2013 Pathology Results   PERITONEAL/ASCITIC  FLUID (SPECIMEN 1 OF 1 COLLECTED 11/18/13): SEROUS CARCINOMA, PLEASE SEE COMMENT.   12/01/2013 Tumor Marker   Patient's tumor was tested for the following markers: CA125 Results of the tumor marker test revealed 1938   12/03/2013 Imaging   Successful ultrasound-guided therapeutic paracentesis yielding 3.6 liters of peritoneal fluid.   12/07/2013 - 05/30/2014 Chemotherapy   She received 3 cycles of carboplatin and Taxol, interrupted cycle 4 for surgery.  She subsequently completed 3 more cycles of chemotherapy after surgery   12/20/2013 Imaging   Successful ultrasound-guided diagnostic and therapeutic paracentesis yielding 2.2 liters of peritoneal fluid   12/20/2013 Pathology Results   PERITONEAL/ASCITIC FLUID(SPECIMEN 1 OF 1 COLLECTED 12/20/13): MALIGNANT CELLS CONSISTENT WITH SEROUS CARCINOMA   01/13/2014 Tumor Marker   Patient's tumor was tested for the following markers: CA125 Results of the tumor marker test revealed 227   02/07/2014 Tumor Marker   Patient's tumor was tested for the following markers: CA125 Results of the tumor marker test revealed 130   02/15/2014 Genetic Testing   Genetics testing from 02-2014 normal ([employer] panel)   02/23/2014 Imaging   Interval decrease in ascites. 2. Morphologic changes in the liver consistent with cirrhosis. 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 Portal vein remains patent at this time. 3. Persistent but decreased abnormal soft tissue attenuation tracking in the omental fat. This may be secondary to interval improvement in metastatic disease. 4. Peritoneal thickening seen in the left abdomen and pelvis on the previous study has decreased and nearly resolved in the interval. This also suggests interval improvement in metastatic disease.    03/07/2014 Procedure   Ultrasound and fluoroscopically guided right internal jugular single lumen power port catheter insertion. Tip in the SVC/RA junction. Catheter ready  for use.   03/17/2014 Tumor Marker   Patient's tumor was tested for the following markers: CA125 Results of the tumor marker test revealed 45   03/29/2014 Pathology Results   1. Omentum, resection for tumor - HIGH GRADE SEROUS CARCINOMA, SEE COMMENT. 2. Ovary and fallopian tube, right - HIGH GRADE SEROUS CARCINOMA, SEE COMMENT. 3. Ovary and fallopian tube, left - HIGH GRADE SEROUS CARCINOMA, SEE COMMENT. Diagnosis Note 1. Nests and clusters of malignant cells are invading the omental tissue (part #1) with associated fibrosis. The cells are pleomorphic with prominent nucleoli. There are scattered psammoma bodies. The ovaries are atrophic and exhibit multiple foci of surface based invasive carcinoma and associated fibrosis. The fallopian tubes have a few foci of carcinoma, also superficially located. There are no precursor lesions noted in the ovaries or fallopian tubes (the entire tubes and ovaries were submitted for evaluation). While there is some retraction artifact, there are several foci suspicious for lymphovascular invasion. 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 Given the fibrosis, there does appear to be a small amount of treatment effect, however, there is abundant residual tumor.    03/29/2014 Surgery   Procedure(s) Performed: 1. Exploratory laparotomy with bilateral salpingo-oophorectomy, omentectomy radical tumor debulking for ovarian cancer .  Surgeon: Thereasa Solo, MD.  Assistant Surgeon: Lahoma Crocker, M.D. Assistant: (an MD assistant was necessary for tissue manipulation, retraction and positioning due to the complexity of the case and hospital policies).  Operative Findings: 10cm  omental cake from hepatic to splenic flexure, densely adherent to transverse colon. Milliary studding of tumor implants (<60m, too numerous in number to count)  adherent to the mesentery of the small bowel and small bowel wall. Small volume (100cc) ascites. Small ovaries bilaterally, densely adherent to the pelvic cul de sac. Left ovary and tube densely adherent to sigmoid colon. Cirrhotic liver with hepatomegaly and splenomegaly.     This represented an optimal cytoreduction (R1) with visible disease residual on the bowel wall and mesentery (millial, <350mimplants).     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 2. 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    08/18/2014 Tumor Marker   Patient's tumor was tested for the following markers: CA125 Results of the tumor marker test revealed 16   10/13/2014 Tumor Marker   Patient's tumor was tested for the following markers: CA125 Results of the tumor marker test revealed 14   11/21/2014 Tumor Marker   Patient's tumor was tested for the following markers: CA125 Results of the tumor marker test revealed 16   12/28/2014 Tumor Marker   Patient's tumor was tested for the following markers: CA125 Results of the tumor marker test revealed 762   02/27/2015 Tumor Marker   Patient's tumor was tested for the following markers: CA125 Results of the tumor marker test revealed 20.2   03/22/2015 Imaging   Filler appearance to the prior exam, with very subtle fluid tracking along portions of the liver, and very minimal nodularity along the right paracolic gutter representing residua from the prior peritoneal cancer. The current  abnormalities could simply be post therapy findings rather than necessarily representing residual malignancy. No new or enlarging lesions are identified. 2. Hepatic cirrhosis and splenomegaly. There is some gastric varices suggesting portal venous hypertension. 3. Left foraminal stenosis at L4-5 due to spurring. There is likely also some central narrowing of the thecal sac at this level. 4. Bibasilar scarring. 5. Chronic mild left mid kidney scarring   03/27/2015 Tumor Marker   Patient's tumor was tested for the following markers: CA125 Results of the tumor marker test revealed 25.3   04/24/2015 Imaging   Disc bulge L2-3 with mild spinal stenosis and right foraminal encroachment. 2. Disc bulge L3-4 with mild spinal stenosis. 3. Multifactorial spinal, left lateral recess and foraminal stenosis L4-5. There is grade 1 anterolisthesis without evident dynamic instability.   05/29/2015 Tumor Marker   Patient's tumor was tested for the following markers: CA125 Results of the tumor marker test revealed 29.9   08/21/2015 Tumor Marker   Patient's tumor was tested for the following markers: CA125 Results of the tumor marker test revealed 45   09/18/2015 Tumor Marker   Patient's tumor was tested for the following markers: CA125 Results of the tumor marker test revealed 47   09/27/2015 Imaging   New small bowel mesenteric nodule and enlarged left external iliac lymph node, highly worrisome for metastatic disease. 2. Cirrhosis and splenomegaly   10/10/2015 Tumor Marker   Patient's tumor was tested for  the following markers: CA125 Results of the tumor marker test revealed 53.4   10/10/2015 - 12/11/2015 Chemotherapy   She received 4 cycles of carboplatin only   11/20/2015 Tumor Marker   Patient's tumor was tested for the following markers: CA125 Results of the tumor marker test revealed 41.3   12/20/2015 Imaging   CT: Mild left external iliac lymphadenopathy is slightly decreased. Mildly enlarged lower  mesenteric nodule is slightly decreased. 2. No new or progressive metastatic disease in the abdomen or pelvis. 3. Cirrhosis. Stable subcentimeter hypodense left liver lobe lesion. No new liver lesions. 4. Stable mild splenomegaly.  No ascites. 5. Mild sigmoid diverticulosis.   01/01/2016 -  Anti-estrogen oral therapy   She was placed on tamoxifen. Had letrozole initially but switched to tamoxifen due to poor tolerance   01/15/2016 Tumor Marker   Patient's tumor was tested for the following markers: CA125 Results of the tumor marker test revealed 31.4   02/19/2016 Tumor Marker   Patient's tumor was tested for the following markers: CA125 Results of the tumor marker test revealed 36.5   05/13/2016 Imaging   No evidence of disease progression within the abdomen or pelvis. Previously noted small central mesenteric nodule and left external iliac node appear slightly smaller. 2. Stable changes of hepatic cirrhosis and portal hypertension with associated splenomegaly. No new or enlarging hepatic lesions are identified. 3. No acute findings.   05/13/2016 Tumor Marker   Patient's tumor was tested for the following markers: CA125 Results of the tumor marker test revealed 41.2   06/24/2016 Tumor Marker   Patient's tumor was tested for the following markers: CA125 Results of the tumor marker test revealed 47.3   08/20/2016 Imaging   1. The left external iliac lymph node is slightly increased in size compared to the prior exam, currently 1.1 cm and previously 0.9 cm in diameter. 2. Stable central mesenteric lymph node at 0.9 cm in diameter. 3. Stable trace free pelvic fluid and stable slight thickening along the right paracolic gutter, without well-defined peritoneal nodularity. 4. Other imaging findings of potential clinical significance: Subsegmental atelectasis or scarring in the lung bases. Hepatic cirrhosis. Left renal scarring. Mild splenomegaly. Sigmoid colon diverticulosis. Impingement at L4-5 due to  spondylosis and degenerative disc disease broad Schmorl' s nodes at L3-L4. Pelvic floor laxity   08/23/2016 Tumor Marker   Patient's tumor was tested for the following markers: CA125 Results of the tumor marker test revealed 80.2   09/04/2016 Imaging   LV EF: 60% -   65%   09/12/2016 Tumor Marker   Patient's tumor was tested for the following markers: CA125 Results of the tumor marker test revealed 98.5   09/12/2016 - 01/29/2018 Chemotherapy   The patient received Doxil and Avastin. Avastin was discontinued in Dec 2019 due to GI hemorrhage   09/26/2016 Tumor Marker   Patient's tumor was tested for the following markers: CA125 Results of the tumor marker test revealed 68.9   10/10/2016 Tumor Marker   Patient's tumor was tested for the following markers: CA125 Results of the tumor marker test revealed 65.6   11/07/2016 Tumor Marker   Patient's tumor was tested for the following markers: CA125 Results of the tumor marker test revealed 46.4   11/29/2016 Imaging   Stable mild peritoneal thickening, suspicious for peritoneal carcinomatosis. No new or progressive disease identified. No evidence of ascites.  No significant change in 10 mm left external iliac and 8 mm mesenteric lymph nodes.  Stable hepatic cirrhosis, and splenomegaly consistent with  portal venous hypertension.  Colonic diverticulosis, without radiographic evidence of diverticulitis.   12/02/2016 Imaging   Normal LV size with EF 55%. Basal inferior and basal inferoseptal hypokinesis. Normal RV size and systolic function. No significant valvular abnormalities.   12/05/2016 Tumor Marker   Patient's tumor was tested for the following markers: CA125 Results of the tumor marker test revealed 49.1   01/09/2017 Tumor Marker   Patient's tumor was tested for the following markers: CA125 Results of the tumor marker test revealed 47.2   02/06/2017 Tumor Marker   Patient's tumor was tested for the following markers: CA125 Results  of the tumor marker test revealed 44.1   02/18/2017 Imaging   Stable mild peritoneal thickening. No new or progressive disease identified within the abdomen or pelvis.  Stable tiny sub-cm left external iliac and mesenteric lymph nodes.  Stable hepatic cirrhosis, and splenomegaly consistent with portal venous hypertension. No evidence of hepatic neoplasm.  Colonic diverticulosis, without radiographic evidence of diverticulitis.   03/06/2017 Tumor Marker   Patient's tumor was tested for the following markers: CA125 Results of the tumor marker test revealed 50.4   03/17/2017 Imaging   - Left ventricle: The cavity size was normal. Wall thickness was normal. Systolic function was normal. The estimated ejection fraction was in the range of 55% to 60%. Wall motion was normal; there were no regional wall motion abnormalities. Features are consistent with a pseudonormal left ventricular filling pattern, with concomitant abnormal relaxation and increased filling pressure (grade 2 diastolic dysfunction). - Mitral valve: There was mild regurgitation. - Left atrium: The atrium was mildly dilated   04/03/2017 Tumor Marker   Patient's tumor was tested for the following markers: CA125 Results of the tumor marker test revealed 47.2   05/14/2017 Imaging   CT scan of abdomen and pelvis Stable mild peritoneal thickening. No new or progressive disease within the abdomen or pelvis.  Stable hepatic cirrhosis. Stable splenomegaly, consistent with portal venous hypertension. No evidence of hepatic neoplasm.   05/15/2017 Tumor Marker   Patient's tumor was tested for the following markers: CA125 Results of the tumor marker test revealed 56.1   06/12/2017 Tumor Marker   Patient's tumor was tested for the following markers: CA125 Results of the tumor marker test revealed 49.2   07/10/2017 Tumor Marker   Patient's tumor was tested for the following markers: CA125 Results of the tumor marker test revealed 46.3    08/07/2017 Tumor Marker   Patient's tumor was tested for the following markers: CA125 Results of the tumor marker test revealed 52.9   08/20/2017 Imaging   1. Mild omental/peritoneal haziness, unchanged. No evidence of new metastatic disease. 2. Cirrhosis with splenomegaly.   09/04/2017 Tumor Marker   Patient's tumor was tested for the following markers: CA125 Results of the tumor marker test revealed 48.5   11/13/2017 Tumor Marker   Patient's tumor was tested for the following markers: CA125 Results of the tumor marker test revealed 55.8   12/17/2017 Imaging   1. No findings to suggest metastatic disease in the abdomen or pelvis. 2. Severe hepatic cirrhosis with evidence of portal hypertension, as demonstrated by dilated portal vein, splenomegaly and portosystemic collateral pathways, including gastric and esophageal varices.  3. Colonic diverticulosis without evidence of acute diverticulitis at this time. 4. Additional incidental findings, as above.   01/16/2018 Tumor Marker   Patient's tumor was tested for the following markers: CA125 Results of the tumor marker test revealed 63.7   02/10/2018 Procedure   EGD - Recently  bleeding grade III and large (> 5 mm) esophageal varices. Completely eradicated. Banded. - Portal hypertensive gastropathy. - Normal examined duodenum. - No specimens collected   02/13/2018 Tumor Marker   Patient's tumor was tested for the following markers: CA125 Results of the tumor marker test revealed 82.7   02/25/2018 Imaging   Bone density showed mild osteopenia   04/08/2018 Tumor Marker   Patient's tumor was tested for the following markers: CA125 Results of the tumor marker test revealed 82.7   04/08/2018 Imaging   1. No definite omental or peritoneal surface lesions. However, there are 2 slowly enlarging lymph nodes noted. One is in the small bowel mesentery and the other is in the left deep pelvis. Could not exclude recurrent disease. 2. Stable  advanced cirrhotic changes involving the liver with portal venous hypertension, portal venous collaterals, esophageal varices and splenomegaly. No worrisome hepatic lesions. 3. Diffuse wall thickening of the colon could suggest diffuse colitis or could be due to low albumin. Recommend correlation with any symptoms such as diarrhea.   05/21/2018 Tumor Marker   Patient's tumor was tested for the following markers: CA125 Results of the tumor marker test revealed 86   09/07/2018 Tumor Marker   Patient's tumor was tested for the following markers:CA-125 Results of the tumor marker test revealed 111   09/29/2018 Tumor Marker   Patient's tumor was tested for the following markers: CA-125 Results of the tumor marker test revealed 121.   09/29/2018 - 11/24/2018 Chemotherapy   The patient had carboplatin and gemzar for chemotherapy treatment.  Chemo was stopped due to allergic reaction to carboplatin   10/27/2018 Tumor Marker   Patient's tumor was tested for the following markers: CA-125 Results of the tumor marker test revealed 99.9   11/24/2018 Tumor Marker   Patient's tumor was tested for the following markers: CA-125 Results of the tumor marker test revealed 81.2   12/14/2018 Tumor Marker   Patient's tumor was tested for the following markers: CA-125 Results of the tumor marker test revealed 81.2   12/14/2018 Imaging   1. Mild improvement in peritoneal disease. Peritoneal nodule within the left posterior pelvis contiguous with the left side of vaginal cuff is slightly decreased in size in the interval. Within the right iliac fossa there is a small peritoneal nodule which has decreased in size in the interval. Central small bowel mesenteric lymph node is not significantly changed. Slight decrease in size left periaortic lymph node.    2. Morphologic features of the liver compatible with cirrhosis. Splenomegaly is noted which may reflect portal venous hypertension.   12/21/2018 Tumor Marker    Patient's tumor was tested for the following markers: CA-125 Results of the tumor marker test revealed 64.2   12/21/2018 -  Chemotherapy   The patient had taxotere for chemotherapy treatment.     03/02/2019 Tumor Marker   Patient's tumor was tested for the following markers: CA-125. 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   No new or progressive metastatic disease within the abdomen or pelvis.   Hepatic cirrhosis and findings of portal venous hypertension. 1.1 cm low-attenuation lesion within left hepatic lobe, which could represent a regenerative nodule or small hepatocellular carcinoma. Recommend abdomen MRI without and with contrast for further characterization.     04/13/2019 Tumor Marker   Patient's tumor was tested for the following markers: CA-125 Results of the tumor marker test revealed 36.8  05/26/2019 Tumor Marker   Patient's tumor was tested for the following markers: CA-125 Results of the tumor marker test revealed 31.6.   06/15/2019 Tumor Marker   Patient's tumor was tested for the following markers: CA-125 Results of the tumor marker test revealed 25.4   07/05/2019 Imaging   1. Stable CT examination. Stable peritoneal thickening in the pelvis and paracolic gutters. Stable solid left deep pelvic implant. Stable trace ascites. No new or progressive metastatic disease. 2. Stable cirrhosis. No discrete liver masses. 3. Stable mild-to-moderate splenomegaly. 4. Aortic Atherosclerosis (ICD10-I70.0).   07/27/2019 Tumor Marker   Patient's tumor was tested for the following markers: CA-125 Results of the tumor marker test revealed 35.4   08/17/2019 Tumor Marker   Patient's tumor was tested for the following markers: Ca-125 Results of the tumor marker test revealed 35.7.   09/17/2019 - 09/21/2019 Hospital Admission   She was admitted due to esophageal variceal bleeding presents  with hematemesis and melanotic stool with epigastric pain.  GI consulted, started on octreotide infusion.  Underwent EGD which showed minimal bleeding, remain on octreotide drip, PPI drip.  Hemoglobin trended down therefore received PRBC transfusion.  Over several days her bleeding subsided, hemodynamically remained stable.    09/17/2019 Procedure   EGD report - Esophageal mucosal scarring, from prior esophageal banding sessions. - Grade I esophageal varices. - Red blood in the cardia and in the gastric fundus. - Portal hypertensive gastropathy. - Normal duodenal bulb, first portion of the duodenum and second portion of the duodenum. 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 2. Otherwise stable CT exam. Stable peritoneal thickening within the bilateral deep pelvic peritoneum. Stable left deep pelvic implant.No evidence for progressed disease. 3. Stable splenomegaly. 4. Morphologic changes to the liver compatible with cirrhosis. Trace perihepatic and pelvic ascites.   02/22/2020 Imaging   1. New lucent lesion in the left side of the L4 vertebral body measuring 1 cm, concerning for possible osseous metastasis. Consideration for further evaluation with bone scan is suggested. Close attention on follow-up imaging is also recommended to ensure stability. 2. Previously noted peritoneal thickening and nodularity in the low anatomic pelvis has regressed slightly. 3. Borderline enlarged and mildly enlarged pelvic and retroperitoneal lymph nodes appear similar to slightly increased compared to the prior examination,  as above. Continued attention on follow-up studies is recommended.  4. 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 5. Aortic atherosclerosis. 6. Additional incidental findings, as above.   02/22/2020 Tumor Marker   Patient's tumor was tested for the following markers: CA-125 Results of the tumor marker test revealed 30.1     REVIEW OF SYSTEMS:   Constitutional: Denies fevers, chills or abnormal weight loss Eyes: Denies blurriness of vision Ears, nose, mouth, throat, and face: Denies mucositis or sore throat Respiratory: Denies cough, dyspnea or wheezes Cardiovascular: Denies palpitation, chest discomfort or lower extremity swelling Gastrointestinal:  Denies nausea, heartburn or change in bowel habits Skin: Denies abnormal skin rashes Lymphatics: Denies new lymphadenopathy or easy bruising Neurological:Denies numbness, tingling or new weaknesses Behavioral/Psych: Mood is stable, no new changes  All other systems were reviewed with the patient and  are negative.  I have reviewed the past medical history, past surgical history, social history and family history with the patient and they are unchanged from previous note.  ALLERGIES:  is allergic to carboplatin, shellfish allergy, vancomycin, benadryl [diphenhydramine hcl], and penicillins.  MEDICATIONS:  Current Outpatient Medications  Medication Sig Dispense Refill  . cholecalciferol (VITAMIN D) 1000 units tablet Take 1,000 Units by mouth at bedtime.     Marland Kitchen dexamethasone (DECADRON) 4 MG tablet Start the day before Taxotere, take 1 tablet in the morning, and then 1 tablet daily after chemo for 2 days. Pls dispense 30 tabs (Patient taking differently: Take 4 mg by mouth See admin instructions. Start the day before Taxotere, take 1 tablet in the morning, and then 1 tablet daily after chemo for 2 days. Pls dispense 30 tabs) 30 tablet 1  . gabapentin (NEURONTIN) 300 MG capsule  TAKE 2 CAPSULES(600 MG) BY MOUTH TWICE DAILY 120 capsule 11  . HUMALOG MIX 75/25 KWIKPEN (75-25) 100 UNIT/ML Kwikpen Inject 22 Units into the skin 2 (two) times daily. Before breakfast & before supper Except when taking Chemo take 32 units  For three to five days    . HYDROcodone-acetaminophen (NORCO/VICODIN) 5-325 MG tablet Take 1 tablet by mouth every 6 (six) hours as needed for moderate pain. 60 tablet 0  . lidocaine-prilocaine (EMLA) cream Apply to Porta-cath  1-2 hours prior to access as directed. (Patient taking differently: Apply 1 application topically daily as needed (port access). ) 30 g 9  . nadolol (CORGARD) 20 MG tablet Take 1 tablet (20 mg total) by mouth daily. (Patient taking differently: Take 20 mg by mouth at bedtime. ) 90 tablet 3  . ondansetron (ZOFRAN) 8 MG tablet Take 1 tablet (8 mg total) by mouth every 8 (eight) hours as needed. 60 tablet 1  . pantoprazole (PROTONIX) 40 MG tablet Take 1 tablet (40 mg total) by mouth daily.    Marland Kitchen PROAIR HFA 108 (90 Base) MCG/ACT inhaler Inhale 2 puffs into the lungs every 4 (four) hours as needed for wheezing or shortness of breath.     . prochlorperazine (COMPAZINE) 10 MG tablet Take 1 tablet (10 mg total) by mouth every 6 (six) hours as needed (Nausea or vomiting). 30 tablet 1  . rotigotine (NEUPRO) 4 MG/24HR APPLY 1 PATCH EXTERNALLY TO THE SKIN DAILY 30 patch 6   No current facility-administered medications for this visit.    PHYSICAL EXAMINATION: ECOG PERFORMANCE STATUS: 1 - Symptomatic but completely ambulatory  Vitals:   02/23/20 1013  BP: (!) 151/58  Pulse: 76  Resp: 18  Temp: (!) 97.5 F (36.4 C)  SpO2: 100%   Filed Weights   02/23/20 1013  Weight: 197 lb 12.8 oz (89.7 kg)    GENERAL:alert, no distress and comfortable Musculoskeletal:no cyanosis of digits and no clubbing  NEURO: alert & oriented x 3 with fluent speech, no focal motor/sensory deficits  LABORATORY DATA:  I have reviewed the data as listed    Component  Value Date/Time   NA 142 02/22/2020 0820   NA 142 02/06/2017 0742   K 4.2 02/22/2020 0820   K 3.9 02/06/2017 0742   CL 109 02/22/2020 0820   CO2 26 02/22/2020 0820   CO2 22 02/06/2017 0742   GLUCOSE 110 (H) 02/22/2020 0820   GLUCOSE 156 (H) 02/06/2017 0742   BUN 19 02/22/2020 0820   BUN 13.5 02/06/2017 0742   CREATININE 0.82 02/22/2020 0820   CREATININE 0.85 10/26/2019 0722   CREATININE 0.8  02/06/2017 0742   CALCIUM 8.8 (L) 02/22/2020 0820   CALCIUM 8.5 02/06/2017 0742   PROT 7.4 02/22/2020 0820   PROT 6.9 02/06/2017 0742   ALBUMIN 3.6 02/22/2020 0820   ALBUMIN 3.7 02/06/2017 0742   AST 30 02/22/2020 0820   AST 24 10/26/2019 0722   AST 47 (H) 02/06/2017 0742   ALT 32 02/22/2020 0820   ALT 17 10/26/2019 0722   ALT 54 02/06/2017 0742   ALKPHOS 126 02/22/2020 0820   ALKPHOS 130 02/06/2017 0742   BILITOT 0.6 02/22/2020 0820   BILITOT 0.7 10/26/2019 0722   BILITOT 0.65 02/06/2017 0742   GFRNONAA >60 02/22/2020 0820   GFRNONAA >60 10/26/2019 0722   GFRAA >60 11/09/2019 0953   GFRAA >60 10/26/2019 0722    No results found for: SPEP, UPEP  Lab Results  Component Value Date   WBC 3.7 (L) 02/22/2020   NEUTROABS 2.3 02/22/2020   HGB 12.2 02/22/2020   HCT 36.3 02/22/2020   MCV 93.1 02/22/2020   PLT 81 (L) 02/22/2020      Chemistry      Component Value Date/Time   NA 142 02/22/2020 0820   NA 142 02/06/2017 0742   K 4.2 02/22/2020 0820   K 3.9 02/06/2017 0742   CL 109 02/22/2020 0820   CO2 26 02/22/2020 0820   CO2 22 02/06/2017 0742   BUN 19 02/22/2020 0820   BUN 13.5 02/06/2017 0742   CREATININE 0.82 02/22/2020 0820   CREATININE 0.85 10/26/2019 0722   CREATININE 0.8 02/06/2017 0742      Component Value Date/Time   CALCIUM 8.8 (L) 02/22/2020 0820   CALCIUM 8.5 02/06/2017 0742   ALKPHOS 126 02/22/2020 0820   ALKPHOS 130 02/06/2017 0742   AST 30 02/22/2020 0820   AST 24 10/26/2019 0722   AST 47 (H) 02/06/2017 0742   ALT 32 02/22/2020 0820   ALT 17 10/26/2019  0722   ALT 54 02/06/2017 0742   BILITOT 0.6 02/22/2020 0820   BILITOT 0.7 10/26/2019 0722   BILITOT 0.65 02/06/2017 0742       RADIOGRAPHIC STUDIES: I have reviewed multiple imaging studies with the patient and her husband I have personally reviewed the radiological images as listed and agreed with the findings in the report. CT CHEST ABDOMEN PELVIS W CONTRAST  Result Date: 02/22/2020 CLINICAL DATA:  75 year old female with history of ovarian cancer undergoing ongoing chemotherapy. Follow-up study to assess treatment response. EXAM: CT CHEST, ABDOMEN, AND PELVIS WITH CONTRAST TECHNIQUE: Multidetector CT imaging of the chest, abdomen and pelvis was performed following the standard protocol during bolus administration of intravenous contrast. CONTRAST:  188m OMNIPAQUE IOHEXOL 300 MG/ML  SOLN COMPARISON:  CT the abdomen and pelvis 11/05/2019. Chest CT 09/15/2018. FINDINGS: CT CHEST FINDINGS Cardiovascular: Heart size is normal. There is no significant pericardial fluid, thickening or pericardial calcification. Aortic atherosclerosis. No definite coronary artery calcifications. Right internal jugular single-lumen porta cath with tip terminating in the distal superior vena cava. Mediastinum/Nodes: No pathologically enlarged mediastinal or hilar lymph nodes. Esophagus is unremarkable in appearance. No axillary lymphadenopathy. Lungs/Pleura: Linear areas of scarring are noted throughout the lungs bilaterally, similar to prior studies. No suspicious pulmonary nodules or masses are noted. No acute consolidative airspace disease. No pleural effusions. Musculoskeletal: Bilateral breast implants are incidentally noted. There are no aggressive appearing lytic or blastic lesions noted in the visualized portions of the skeleton. CT ABDOMEN PELVIS FINDINGS Hepatobiliary: Liver has a shrunken appearance and nodular contour, indicative of underlying cirrhosis. No  discrete cystic or solid hepatic lesions. No intra or  extrahepatic biliary ductal dilatation. Gallbladder is normal in appearance. Pancreas: No pancreatic mass. No pancreatic ductal dilatation. No pancreatic or peripancreatic fluid collections or inflammatory changes. Spleen: Spleen is enlarged measuring 15.7 x 6.5 x 15.3 cm (estimated splenic volume of 781 mL). Adrenals/Urinary Tract: Mild cortical scarring in the interpolar region of the left kidney. Right kidney and bilateral adrenal glands are otherwise normal in appearance. No hydroureteronephrosis. Urinary bladder is normal in appearance. Stomach/Bowel: The appearance of the stomach is normal. No pathologic dilatation of small bowel or colon. The appendix is not confidently identified and may be surgically absent. Regardless, there are no inflammatory changes noted adjacent to the cecum to suggest the presence of an acute appendicitis at this time. Vascular/Lymphatic: Aortic atherosclerosis, without evidence of aneurysm or dissection in the abdominal or pelvic vasculature. Portal vein is dilated measuring 1.7 cm in the porta hepatis. Multiple prominent borderline enlarged retroperitoneal lymph nodes are again noted, similar to the prior study, measuring up to 9 mm in short axis adjacent to the aortic bifurcation (axial image 78 of series 2). Mildly enlarged left external iliac lymph node measuring 1.2 cm in short axis (axial image 100 of series 2), increased from 1.2 cm in short axis on the prior study. Reproductive: Status post hysterectomy. Ovaries are not confidently identified may be surgically absent or atrophic. Other: Small amount of peritoneal thickening and nodularity noted in the low anatomic pelvis best appreciated on the left side (axial image 100 of series 2), overall decreased compared to the prior examination. No significant volume of ascites. No pneumoperitoneum. Musculoskeletal: New lucent lesion in the left side of the L4 vertebral body measuring 1 cm on today's exam (sagittal image 105 of  series 6). There are no other aggressive appearing lytic or blastic lesions noted in the visualized portions of the skeleton. IMPRESSION: 1. New lucent lesion in the left side of the L4 vertebral body measuring 1 cm, concerning for possible osseous metastasis. Consideration for further evaluation with bone scan is suggested. Close attention on follow-up imaging is also recommended to ensure stability. 2. Previously noted peritoneal thickening and nodularity in the low anatomic pelvis has regressed slightly. 3. Borderline enlarged and mildly enlarged pelvic and retroperitoneal lymph nodes appear similar to slightly increased compared to the prior examination, as above. Continued attention on follow-up studies is recommended. 4. 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 5. Aortic atherosclerosis. 6. Additional incidental findings, as above. Electronically Signed   By: Vinnie Langton M.D.   On: 02/22/2020 12:05

## 2020-03-07 ENCOUNTER — Telehealth: Payer: Self-pay

## 2020-03-07 ENCOUNTER — Other Ambulatory Visit: Payer: Self-pay

## 2020-03-07 DIAGNOSIS — Z794 Long term (current) use of insulin: Secondary | ICD-10-CM

## 2020-03-07 DIAGNOSIS — E1140 Type 2 diabetes mellitus with diabetic neuropathy, unspecified: Secondary | ICD-10-CM

## 2020-03-07 DIAGNOSIS — K7581 Nonalcoholic steatohepatitis (NASH): Secondary | ICD-10-CM

## 2020-03-07 DIAGNOSIS — C482 Malignant neoplasm of peritoneum, unspecified: Secondary | ICD-10-CM

## 2020-03-07 DIAGNOSIS — D696 Thrombocytopenia, unspecified: Secondary | ICD-10-CM

## 2020-03-07 DIAGNOSIS — K7460 Unspecified cirrhosis of liver: Secondary | ICD-10-CM

## 2020-03-07 NOTE — Telephone Encounter (Signed)
[place] received call from [place]. At Dr. Alvy Bimler office reporting provider placed referral for COVID Treatment infusions.  Olivia Benson reports +COVID 1/24 +COUGH +FEVER &  HCC peritoneal CA, liver cirrhosis, diabetes, iron deficiency.

## 2020-03-07 NOTE — Telephone Encounter (Signed)
She called and left a message to call her. \  Called back. She tested positive for covid yesterday at home. Symptoms started on Friday. Temperature 101. Yesterday, no fever today. She has a cough and generally not feeling good. Dr. Alvy Bimler made aware. Referral sent for monoclonal infusion and left message for respiratory clinic.

## 2020-03-08 ENCOUNTER — Other Ambulatory Visit (HOSPITAL_COMMUNITY): Payer: Self-pay | Admitting: Adult Health

## 2020-03-08 DIAGNOSIS — U071 COVID-19: Secondary | ICD-10-CM

## 2020-03-08 NOTE — Progress Notes (Signed)
I connected by phone with Olivia Benson on 03/08/2020 at 9:50 AM to discuss the potential use of a new treatment for mild to moderate COVID-19 viral infection in non-hospitalized patients.  This patient is a 75 y.o. female that meets the FDA criteria for Emergency Use Authorization of COVID monoclonal antibody sotrovimab.  Has a (+) direct SARS-CoV-2 viral test result  Has mild or moderate COVID-19   Is NOT hospitalized due to COVID-19  Is within 10 days of symptom onset  Has at least one of the high risk factor(s) for progression to severe COVID-19 and/or hospitalization as defined in EUA.  Specific high risk criteria : Diabetes and Immunosuppressive Disease or Treatment   I have spoken and communicated the following to the patient or parent/caregiver regarding COVID monoclonal antibody treatment:  1. FDA has authorized the emergency use for the treatment of mild to moderate COVID-19 in adults and pediatric patients with positive results of direct SARS-CoV-2 viral testing who are 30 years of age and older weighing at least 40 kg, and who are at high risk for progressing to severe COVID-19 and/or hospitalization.  2. The significant known and potential risks and benefits of COVID monoclonal antibody, and the extent to which such potential risks and benefits are unknown.  3. Information on available alternative treatments and the risks and benefits of those alternatives, including clinical trials.  4. Patients treated with COVID monoclonal antibody should continue to self-isolate and use infection control measures (e.g., wear mask, isolate, social distance, avoid sharing personal items, clean and disinfect "high touch" surfaces, and frequent handwashing) according to CDC guidelines.   5. The patient or parent/caregiver has the option to accept or refuse COVID monoclonal antibody treatment. 6. Sx onset 1/21; cost reviewed  After reviewing this information with the patient, the patient has  agreed to receive one of the available covid 19 monoclonal antibodies and will be provided an appropriate fact sheet prior to infusion. Scot Dock, NP 03/08/2020 9:50 AM

## 2020-03-09 ENCOUNTER — Ambulatory Visit (HOSPITAL_COMMUNITY)
Admission: RE | Admit: 2020-03-09 | Discharge: 2020-03-09 | Disposition: A | Discharge status: Home / Self Care | Payer: Medicare Other | Source: Ambulatory Visit | Attending: Pulmonary Disease | Admitting: Pulmonary Disease

## 2020-03-09 DIAGNOSIS — E119 Type 2 diabetes mellitus without complications: Secondary | ICD-10-CM | POA: Diagnosis not present

## 2020-03-09 DIAGNOSIS — U071 COVID-19: Secondary | ICD-10-CM

## 2020-03-09 DIAGNOSIS — D849 Immunodeficiency, unspecified: Secondary | ICD-10-CM | POA: Insufficient documentation

## 2020-03-09 MED ORDER — METHYLPREDNISOLONE SODIUM SUCC 125 MG IJ SOLR: 125.0000 mg | Freq: Once | INTRAMUSCULAR | Status: DC | PRN

## 2020-03-09 MED ORDER — SODIUM CHLORIDE 0.9 % IV SOLN: INTRAVENOUS | Status: DC | PRN

## 2020-03-09 MED ORDER — SOTROVIMAB 500 MG/8ML IV SOLN
500.0000 mg | Freq: Once | INTRAVENOUS | Status: AC
  Administered 2020-03-09: 500 mg via INTRAVENOUS

## 2020-03-09 MED ORDER — DIPHENHYDRAMINE HCL 50 MG/ML IJ SOLN: 50.0000 mg | Freq: Once | INTRAMUSCULAR | Status: DC | PRN

## 2020-03-09 MED ORDER — FAMOTIDINE IN NACL 20-0.9 MG/50ML-% IV SOLN: 20.0000 mg | Freq: Once | INTRAVENOUS | Status: DC | PRN

## 2020-03-09 MED ORDER — EPINEPHRINE 0.3 MG/0.3ML IJ SOAJ: 0.3000 mg | Freq: Once | INTRAMUSCULAR | Status: DC | PRN

## 2020-03-09 MED ORDER — ALBUTEROL SULFATE HFA 108 (90 BASE) MCG/ACT IN AERS: 2.0000 | INHALATION_SPRAY | Freq: Once | RESPIRATORY_TRACT | Status: DC | PRN

## 2020-03-09 NOTE — Discharge Instructions (Signed)
10 Things You Can Do to Manage Your COVID-19 Symptoms at Home If you have possible or confirmed COVID-19: 1. Stay home except to get medical care. 2. Monitor your symptoms carefully. If your symptoms get worse, call your healthcare provider immediately. 3. Get rest and stay hydrated. 4. If you have a medical appointment, call the healthcare provider ahead of time and tell them that you have or may have COVID-19. 5. For medical emergencies, call 911 and notify the dispatch personnel that you have or may have COVID-19. 6. Cover your cough and sneezes with a tissue or use the inside of your elbow. 7. Wash your hands often with soap and water for at least 20 seconds or clean your hands with an alcohol-based hand sanitizer that contains at least 60% alcohol. 8. As much as possible, stay in a specific room and away from other people in your home. Also, you should use a separate bathroom, if available. If you need to be around other people in or outside of the home, wear a mask. 9. Avoid sharing personal items with other people in your household, like dishes, towels, and bedding. 10. Clean all surfaces that are touched often, like counters, tabletops, and doorknobs. Use household cleaning sprays or wipes according to the label instructions. [email or web address] 08/27/2019 This information is not intended to replace advice given to you by your health care provider. Make sure you discuss any questions you have with your health care provider. Document Revised: 12/13/2019 Document Reviewed: 12/13/2019 Elsevier Patient Education  2021 [place].  What types of side effects do monoclonal antibody drugs cause?  Common side effects  In general, the more common side effects caused by monoclonal antibody drugs include:  Allergic reactions, such as hives or itching  Flu-like signs and symptoms, including chills, fatigue, fever, and muscle aches and pains  Nausea, vomiting  Diarrhea  Skin  rashes  Low blood pressure   The CDC is recommending patients who receive monoclonal antibody treatments wait at least 90 days before being vaccinated.  Currently, there are no data on the safety and efficacy of mRNA COVID-19 vaccines in persons who received monoclonal antibodies or convalescent plasma as part of COVID-19 treatment. Based on the estimated half-life of such therapies as well as evidence suggesting that reinfection is uncommon in the 90 days after initial infection, vaccination should be deferred for at least 90 days, as a precautionary measure until additional information becomes available, to avoid interference of the antibody treatment with vaccine-induced immune responses.  Patient reviewed Fact Sheet for Patients, Parents, and Caregivers for Emergency Use Authorization (EUA) of sotrovimab for the Treatment of Coronavirus. Patient also reviewed and is agreeable to the estimated cost of treatment. Patient is agreeable to proceed.    If you have any questions or concerns after the infusion please call the Advanced Practice Provider on call at [phone]. This number is ONLY intended for your use regarding questions or concerns about the infusion post-treatment side-effects.  Please do not provide this number to others for use. For return to work notes please contact your primary care provider.   If someone you know is interested in receiving treatment please have them contact their MD for a referral or visit [email or web address]

## 2020-03-09 NOTE — Progress Notes (Signed)
Diagnosis: COVID-19  Physician: Dr. Patrick Wright  Procedure: Covid Infusion Clinic Med: Sotrovimab infusion - Provided patient with sotrovimab fact sheet for patients, parents, and caregivers prior to infusion.   Complications: No immediate complications noted  Discharge: Discharged home    

## 2020-03-09 NOTE — Progress Notes (Signed)
Patient reviewed Fact Sheet for Patients, Parents, and Caregivers for Emergency Use Authorization (EUA) of sotrovimab for the Treatment of Coronavirus. Patient also reviewed and is agreeable to the estimated cost of treatment. Patient is agreeable to proceed.   

## 2020-03-13 ENCOUNTER — Other Ambulatory Visit (HOSPITAL_COMMUNITY): Payer: Self-pay

## 2020-04-06 ENCOUNTER — Encounter: Payer: Self-pay | Admitting: Hematology and Oncology

## 2020-04-06 ENCOUNTER — Inpatient Hospital Stay: Payer: Medicare Other

## 2020-04-06 ENCOUNTER — Other Ambulatory Visit: Payer: Self-pay

## 2020-04-06 ENCOUNTER — Inpatient Hospital Stay: Payer: Medicare Other | Attending: Hematology and Oncology | Admitting: Hematology and Oncology

## 2020-04-06 ENCOUNTER — Telehealth: Payer: Self-pay | Admitting: Hematology and Oncology

## 2020-04-06 ENCOUNTER — Other Ambulatory Visit: Payer: Self-pay | Admitting: Hematology and Oncology

## 2020-04-06 VITALS — BP 155/68 | HR 72 | Temp 97.2°F | Resp 18 | Wt 196.8 lb

## 2020-04-06 DIAGNOSIS — Z79899 Other long term (current) drug therapy: Secondary | ICD-10-CM | POA: Insufficient documentation

## 2020-04-06 DIAGNOSIS — G8929 Other chronic pain: Secondary | ICD-10-CM | POA: Diagnosis not present

## 2020-04-06 DIAGNOSIS — C482 Malignant neoplasm of peritoneum, unspecified: Secondary | ICD-10-CM

## 2020-04-06 DIAGNOSIS — Z9221 Personal history of antineoplastic chemotherapy: Secondary | ICD-10-CM | POA: Diagnosis not present

## 2020-04-06 DIAGNOSIS — M545 Low back pain, unspecified: Secondary | ICD-10-CM | POA: Diagnosis not present

## 2020-04-06 DIAGNOSIS — D61818 Other pancytopenia: Secondary | ICD-10-CM | POA: Insufficient documentation

## 2020-04-06 DIAGNOSIS — R161 Splenomegaly, not elsewhere classified: Secondary | ICD-10-CM | POA: Diagnosis not present

## 2020-04-06 DIAGNOSIS — K769 Liver disease, unspecified: Secondary | ICD-10-CM | POA: Diagnosis not present

## 2020-04-06 DIAGNOSIS — D696 Thrombocytopenia, unspecified: Secondary | ICD-10-CM

## 2020-04-06 DIAGNOSIS — D509 Iron deficiency anemia, unspecified: Secondary | ICD-10-CM

## 2020-04-06 DIAGNOSIS — C569 Malignant neoplasm of unspecified ovary: Secondary | ICD-10-CM

## 2020-04-06 LAB — CBC WITH DIFFERENTIAL/PLATELET
Abs Immature Granulocytes: 0.01 10*3/uL (ref 0.00–0.07)
Basophils Absolute: 0 10*3/uL (ref 0.0–0.1)
Basophils Relative: 1 %
Eosinophils Absolute: 0.1 10*3/uL (ref 0.0–0.5)
Eosinophils Relative: 3 %
HCT: 37 % (ref 36.0–46.0)
Hemoglobin: 12.3 g/dL (ref 12.0–15.0)
Immature Granulocytes: 0 %
Lymphocytes Relative: 24 %
Lymphs Abs: 1 10*3/uL (ref 0.7–4.0)
MCH: 30.8 pg (ref 26.0–34.0)
MCHC: 33.2 g/dL (ref 30.0–36.0)
MCV: 92.5 fL (ref 80.0–100.0)
Monocytes Absolute: 0.3 10*3/uL (ref 0.1–1.0)
Monocytes Relative: 6 %
Neutro Abs: 2.9 10*3/uL (ref 1.7–7.7)
Neutrophils Relative %: 66 %
Platelets: 90 10*3/uL — ABNORMAL LOW (ref 150–400)
RBC: 4 MIL/uL (ref 3.87–5.11)
RDW: 13.2 % (ref 11.5–15.5)
WBC: 4.3 10*3/uL (ref 4.0–10.5)
nRBC: 0 % (ref 0.0–0.2)

## 2020-04-06 LAB — COMPREHENSIVE METABOLIC PANEL
ALT: 35 U/L (ref 0–44)
AST: 34 U/L (ref 15–41)
Albumin: 3.8 g/dL (ref 3.5–5.0)
Alkaline Phosphatase: 138 U/L — ABNORMAL HIGH (ref 38–126)
Anion gap: 9 (ref 5–15)
BUN: 17 mg/dL (ref 8–23)
CO2: 25 mmol/L (ref 22–32)
Calcium: 8.7 mg/dL — ABNORMAL LOW (ref 8.9–10.3)
Chloride: 108 mmol/L (ref 98–111)
Creatinine, Ser: 0.91 mg/dL (ref 0.44–1.00)
GFR, Estimated: >60 mL/min (ref >60)
Glucose, Bld: 103 mg/dL — ABNORMAL HIGH (ref 70–99)
Potassium: 4.4 mmol/L (ref 3.5–5.1)
Sodium: 142 mmol/L (ref 135–145)
Total Bilirubin: 0.7 mg/dL (ref 0.3–1.2)
Total Protein: 7.7 g/dL (ref 6.5–8.1)

## 2020-04-06 LAB — IRON AND TIBC
Iron: 76 ug/dL (ref 41–142)
Saturation Ratios: 23 % (ref 21–57)
TIBC: 334 ug/dL (ref 236–444)
UIBC: 258 ug/dL (ref 120–384)

## 2020-04-06 LAB — FERRITIN: Ferritin: 119 ng/mL (ref 11–307)

## 2020-04-06 MED ORDER — HYDROCODONE-ACETAMINOPHEN 5-325 MG PO TABS: 1.0000 | ORAL_TABLET | Freq: Four times a day (QID) | ORAL | 0 refills | Status: DC | PRN

## 2020-04-06 MED ORDER — HYDROCODONE-ACETAMINOPHEN 5-325 MG PO TABS
1.0000 | ORAL_TABLET | Freq: Four times a day (QID) | ORAL | 0 refills | Status: DC | PRN
Start: 2020-04-06 — End: 2020-04-07

## 2020-04-06 NOTE — Assessment & Plan Note (Signed)
She has multifactorial pancytopenia, due to recent chemotherapy, liver disease with splenomegaly She does not need treatment for thrombocytopenia

## 2020-04-06 NOTE — Telephone Encounter (Signed)
Scheduled appts per 2/24 sch msg. Gave pt a printout of appt calendar.

## 2020-04-06 NOTE — Assessment & Plan Note (Signed)
Overall, her last imaging showed no definitive signs of cancer recurrence Her tumor marker is stable I plan to continue observation and continue on supportive care I plan to see her again in 6 weeks for further follow-up, blood draw and port flushes I plan to repeat CT imaging in her next visit in April

## 2020-04-06 NOTE — Progress Notes (Signed)
[facility] OFFICE PROGRESS NOTE  Patient Care Team: Gregor Hams, FNP as PCP - General (Family Medicine) Tat, Eustace Quail, DO as Consulting Physician (Neurology)  ASSESSMENT & PLAN:  Primary peritoneal carcinomatosis ([place]) Overall, her last imaging showed no definitive signs of cancer recurrence Her tumor marker is stable I plan to continue observation and continue on supportive care I plan to see her again in 6 weeks for further follow-up, blood draw and port flushes I plan to repeat CT imaging in her next visit in April  Low back pain She is taking chronic hydrocodone for chronic back pain I refill her prescription today We discussed narcotic refill policy  Thrombocytopenia ([place]) She has multifactorial pancytopenia, due to recent chemotherapy, liver disease with splenomegaly She does not need treatment for thrombocytopenia   Orders Placed This Encounter  Procedures  . CT ABDOMEN PELVIS W CONTRAST    Standing Status:   Future    Standing Expiration Date:   04/06/2021    Order Specific Question:   If indicated for the ordered procedure, I authorize the administration of contrast media per Radiology protocol    Answer:   Yes    Order Specific Question:   Preferred imaging location?    Answer:   [facility]    Order Specific Question:   Radiology Contrast Protocol - do NOT remove file path    Answer:   [email or web address]    All questions were answered. The patient knows to call the clinic with any problems, questions or concerns. The total time spent in the appointment was 20 minutes encounter with patients including review of chart and various tests results, discussions about plan of care and coordination of care plan   [name], MD 04/06/2020 9:36 AM  INTERVAL HISTORY: Please see below for problem oriented charting. 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 Had allergic reaction to carboplatin. Avastin was stopped due to GI hemorrhage   Primary peritoneal carcinomatosis ([place])  09/06/2011 Imaging   CT findings consistent with cirrhotic changes involving the liver.  No worrisome liver mass.  There are associated portal venous collaterals and splenomegaly consistent with portal venous hypertension. 2.  No other significant upper abdominal findings   11/08/2013 Imaging   US showed ascites   11/08/2013 Initial Diagnosis   Patient had ~ 2 months of some urinary incontinence, saw Dr Ronita Hipps in 10-2013, with pelvic mass on exam    11/12/2013 Imaging   There are findings of progressive hepatic cirrhosis with a large amount of ascites and mild splenomegaly. There are venous collaterals present. 2. There is abnormal thickening of the peritoneal surface along the left mid and lower abdominal wall. There is abnormal soft tissue density in the pelvis as well which could reflect the clinically suspected ovarian malignancy but the findings are nondiagnostic. 3. There is a new small left pleural effusion. 4. There is no acute bowel abnormality.   11/18/2013 Imaging   Successful ultrasound guided diagnostic and therapeutic paracentesis yielding 4.1 liters of ascites.    11/18/2013 Pathology Results   PERITONEAL/ASCITIC FLUID (SPECIMEN 1 OF 1 COLLECTED 11/18/13): SEROUS CARCINOMA, PLEASE SEE COMMENT.   12/01/2013 Tumor Marker  Patient's tumor was tested for the following markers: CA125 Results of the tumor marker test revealed 1938   12/03/2013 Imaging   Successful ultrasound-guided therapeutic paracentesis yielding 3.6 liters of peritoneal fluid.   12/07/2013 - 05/30/2014  Chemotherapy   She received 3 cycles of carboplatin and Taxol, interrupted cycle 4 for surgery.  She subsequently completed 3 more cycles of chemotherapy after surgery   12/20/2013 Imaging   Successful ultrasound-guided diagnostic and therapeutic paracentesis yielding 2.2 liters of peritoneal fluid   12/20/2013 Pathology Results   PERITONEAL/ASCITIC FLUID(SPECIMEN 1 OF 1 COLLECTED 12/20/13): MALIGNANT CELLS CONSISTENT WITH SEROUS CARCINOMA   01/13/2014 Tumor Marker   Patient's tumor was tested for the following markers: CA125 Results of the tumor marker test revealed 227   02/07/2014 Tumor Marker   Patient's tumor was tested for the following markers: CA125 Results of the tumor marker test revealed 130   02/15/2014 Genetic Testing   Genetics testing from 02-2014 normal ([employer]va[employer] panel)   02/23/2014 Imaging   Interval decrease in ascites. 2. Morphologic changes in the liver consistent with cirrhosis. 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 Portal vein remains patent at this time. 3. Persistent but decreased abnormal soft tissue attenuation tracking in the omental fat. This may be secondary to interval improvement in metastatic disease. 4. Peritoneal thickening seen in the left abdomen and pelvis on the previous study has decreased and nearly resolved in the interval. This also suggests interval improvement in metastatic disease.    03/07/2014 Procedure   Ultrasound and fluoroscopically guided right internal jugular single lumen power port catheter insertion. Tip in the SVC/RA junction. Catheter ready for use.   03/17/2014 Tumor Marker   Patient's tumor was tested for the following markers: CA125 Results of the tumor marker test revealed 45   03/29/2014 Pathology Results   1. Omentum, resection for tumor - HIGH GRADE SEROUS CARCINOMA, SEE COMMENT. 2. Ovary and fallopian tube, right - HIGH GRADE SEROUS CARCINOMA, SEE COMMENT. 3. Ovary and fallopian tube,  left - HIGH GRADE SEROUS CARCINOMA, SEE COMMENT. Diagnosis Note 1. Nests and clusters of malignant cells are invading the omental tissue (part #1) with associated fibrosis. The cells are pleomorphic with prominent nucleoli. There are scattered psammoma bodies. The ovaries are atrophic and exhibit multiple foci of surface based invasive carcinoma and associated fibrosis. The fallopian tubes have a few foci of carcinoma, also superficially located. There are no precursor lesions noted in the ovaries or fallopian tubes (the entire tubes and ovaries were submitted for evaluation). While there is some retraction artifact, there are several foci suspicious for lymphovascular invasion. 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 Given the fibrosis, there does appear to be a small amount of treatment effect, however, there is abundant residual tumor.    03/29/2014 Surgery   Procedure(s) Performed: 1. Exploratory laparotomy with bilateral salpingo-oophorectomy, omentectomy radical tumor debulking for ovarian cancer .  Surgeon: Thereasa Solo, MD.  Assistant Surgeon: Lahoma Crocker, M.D. Assistant: (an MD assistant was necessary for tissue manipulation, retraction and positioning due to the complexity of the case and hospital policies).  Operative Findings: 10cm omental cake from hepatic to splenic flexure, densely adherent to transverse colon. Milliary studding of tumor implants (<49m, too numerous in number to count) adherent to the mesentery of the small bowel and small bowel wall. Small volume (100cc) ascites. Small ovaries bilaterally, densely  adherent to the pelvic cul de sac. Left ovary and tube densely adherent to sigmoid colon. Cirrhotic liver with hepatomegaly and splenomegaly.     This represented an optimal cytoreduction (R1) with visible disease residual on the  bowel wall and mesentery (millial, <73m implants).     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 2. 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    08/18/2014 Tumor Marker   Patient's tumor was tested for the following markers: CA125 Results of the tumor marker test revealed 16   10/13/2014 Tumor Marker   Patient's tumor was tested for the following markers: CA125 Results of the tumor marker test revealed 14   11/21/2014 Tumor Marker   Patient's tumor was tested for the following markers: CA125 Results of the tumor marker test revealed 16   12/28/2014 Tumor Marker   Patient's tumor was tested for the following markers: CA125 Results of the tumor marker test revealed 762   02/27/2015 Tumor Marker   Patient's tumor was tested for the following markers: CA125 Results of the tumor marker test revealed 20.2   03/22/2015 Imaging   Filler appearance to the prior exam, with very subtle fluid tracking along portions of the liver, and very minimal nodularity along the right paracolic gutter representing residua from the prior peritoneal cancer. The current abnormalities could simply be post therapy findings rather than necessarily representing residual malignancy. No new or enlarging lesions are identified. 2. Hepatic cirrhosis and splenomegaly. There is some gastric varices suggesting portal venous hypertension. 3. Left foraminal stenosis at L4-5 due to spurring. There is likely also some central narrowing of the  thecal sac at this level. 4. Bibasilar scarring. 5. Chronic mild left mid kidney scarring   03/27/2015 Tumor Marker   Patient's tumor was tested for the following markers: CA125 Results of the tumor marker test revealed 25.3   04/24/2015 Imaging   Disc bulge L2-3 with mild spinal stenosis and right foraminal encroachment. 2. Disc bulge L3-4 with mild spinal stenosis. 3. Multifactorial spinal, left lateral recess and foraminal stenosis L4-5. There is grade 1 anterolisthesis without evident dynamic instability.   05/29/2015 Tumor Marker   Patient's tumor was tested for the following markers: CA125 Results of the tumor marker test revealed 29.9   08/21/2015 Tumor Marker   Patient's tumor was tested for the following markers: CA125 Results of the tumor marker test revealed 45   09/18/2015 Tumor Marker   Patient's tumor was tested for the following markers: CA125 Results of the tumor marker test revealed 47   09/27/2015 Imaging   New small bowel mesenteric nodule and enlarged left external iliac lymph node, highly worrisome for metastatic disease. 2. Cirrhosis and splenomegaly   10/10/2015 Tumor Marker   Patient's tumor was tested for the following markers: CA125 Results of the tumor marker test revealed 53.4   10/10/2015 - 12/11/2015 Chemotherapy   She received 4 cycles of carboplatin only   11/20/2015 Tumor Marker   Patient's tumor was tested for the following markers: CA125 Results  of the tumor marker test revealed 41.3   12/20/2015 Imaging   CT: Mild left external iliac lymphadenopathy is slightly decreased. Mildly enlarged lower mesenteric nodule is slightly decreased. 2. No new or progressive metastatic disease in the abdomen or pelvis. 3. Cirrhosis. Stable subcentimeter hypodense left liver lobe lesion. No new liver lesions. 4. Stable mild splenomegaly.  No ascites. 5. Mild sigmoid diverticulosis.   01/01/2016 -  Anti-estrogen oral therapy   She was placed on tamoxifen. Had letrozole  initially but switched to tamoxifen due to poor tolerance   01/15/2016 Tumor Marker   Patient's tumor was tested for the following markers: CA125 Results of the tumor marker test revealed 31.4   02/19/2016 Tumor Marker   Patient's tumor was tested for the following markers: CA125 Results of the tumor marker test revealed 36.5   05/13/2016 Imaging   No evidence of disease progression within the abdomen or pelvis. Previously noted small central mesenteric nodule and left external iliac node appear slightly smaller. 2. Stable changes of hepatic cirrhosis and portal hypertension with associated splenomegaly. No new or enlarging hepatic lesions are identified. 3. No acute findings.   05/13/2016 Tumor Marker   Patient's tumor was tested for the following markers: CA125 Results of the tumor marker test revealed 41.2   06/24/2016 Tumor Marker   Patient's tumor was tested for the following markers: CA125 Results of the tumor marker test revealed 47.3   08/20/2016 Imaging   1. The left external iliac lymph node is slightly increased in size compared to the prior exam, currently 1.1 cm and previously 0.9 cm in diameter. 2. Stable central mesenteric lymph node at 0.9 cm in diameter. 3. Stable trace free pelvic fluid and stable slight thickening along the right paracolic gutter, without well-defined peritoneal nodularity. 4. Other imaging findings of potential clinical significance: Subsegmental atelectasis or scarring in the lung bases. Hepatic cirrhosis. Left renal scarring. Mild splenomegaly. Sigmoid colon diverticulosis. Impingement at L4-5 due to spondylosis and degenerative disc disease broad Schmorl' s nodes at L3-L4. Pelvic floor laxity   08/23/2016 Tumor Marker   Patient's tumor was tested for the following markers: CA125 Results of the tumor marker test revealed 80.2   09/04/2016 Imaging   LV EF: 60% -   65%   09/12/2016 Tumor Marker   Patient's tumor was tested for the following markers:  CA125 Results of the tumor marker test revealed 98.5   09/12/2016 - 01/29/2018 Chemotherapy   The patient received Doxil and Avastin. Avastin was discontinued in Dec 2019 due to GI hemorrhage   09/26/2016 Tumor Marker   Patient's tumor was tested for the following markers: CA125 Results of the tumor marker test revealed 68.9   10/10/2016 Tumor Marker   Patient's tumor was tested for the following markers: CA125 Results of the tumor marker test revealed 65.6   11/07/2016 Tumor Marker   Patient's tumor was tested for the following markers: CA125 Results of the tumor marker test revealed 46.4   11/29/2016 Imaging   Stable mild peritoneal thickening, suspicious for peritoneal carcinomatosis. No new or progressive disease identified. No evidence of ascites.  No significant change in 10 mm left external iliac and 8 mm mesenteric lymph nodes.  Stable hepatic cirrhosis, and splenomegaly consistent with portal venous hypertension.  Colonic diverticulosis, without radiographic evidence of diverticulitis.   12/02/2016 Imaging   Normal LV size with EF 55%. Basal inferior and basal inferoseptal hypokinesis. Normal RV size and systolic function. No significant valvular abnormalities.   12/05/2016 Tumor Marker  Patient's tumor was tested for the following markers: CA125 Results of the tumor marker test revealed 49.1   01/09/2017 Tumor Marker   Patient's tumor was tested for the following markers: CA125 Results of the tumor marker test revealed 47.2   02/06/2017 Tumor Marker   Patient's tumor was tested for the following markers: CA125 Results of the tumor marker test revealed 44.1   02/18/2017 Imaging   Stable mild peritoneal thickening. No new or progressive disease identified within the abdomen or pelvis.  Stable tiny sub-cm left external iliac and mesenteric lymph nodes.  Stable hepatic cirrhosis, and splenomegaly consistent with portal venous hypertension. No evidence of hepatic  neoplasm.  Colonic diverticulosis, without radiographic evidence of diverticulitis.   03/06/2017 Tumor Marker   Patient's tumor was tested for the following markers: CA125 Results of the tumor marker test revealed 50.4   03/17/2017 Imaging   - Left ventricle: The cavity size was normal. Wall thickness was normal. Systolic function was normal. The estimated ejection fraction was in the range of 55% to 60%. Wall motion was normal; there were no regional wall motion abnormalities. Features are consistent with a pseudonormal left ventricular filling pattern, with concomitant abnormal relaxation and increased filling pressure (grade 2 diastolic dysfunction). - Mitral valve: There was mild regurgitation. - Left atrium: The atrium was mildly dilated   04/03/2017 Tumor Marker   Patient's tumor was tested for the following markers: CA125 Results of the tumor marker test revealed 47.2   05/14/2017 Imaging   CT scan of abdomen and pelvis Stable mild peritoneal thickening. No new or progressive disease within the abdomen or pelvis.  Stable hepatic cirrhosis. Stable splenomegaly, consistent with portal venous hypertension. No evidence of hepatic neoplasm.   05/15/2017 Tumor Marker   Patient's tumor was tested for the following markers: CA125 Results of the tumor marker test revealed 56.1   06/12/2017 Tumor Marker   Patient's tumor was tested for the following markers: CA125 Results of the tumor marker test revealed 49.2   07/10/2017 Tumor Marker   Patient's tumor was tested for the following markers: CA125 Results of the tumor marker test revealed 46.3   08/07/2017 Tumor Marker   Patient's tumor was tested for the following markers: CA125 Results of the tumor marker test revealed 52.9   08/20/2017 Imaging   1. Mild omental/peritoneal haziness, unchanged. No evidence of new metastatic disease. 2. Cirrhosis with splenomegaly.   09/04/2017 Tumor Marker   Patient's tumor was tested for the following  markers: CA125 Results of the tumor marker test revealed 48.5   11/13/2017 Tumor Marker   Patient's tumor was tested for the following markers: CA125 Results of the tumor marker test revealed 55.8   12/17/2017 Imaging   1. No findings to suggest metastatic disease in the abdomen or pelvis. 2. Severe hepatic cirrhosis with evidence of portal hypertension, as demonstrated by dilated portal vein, splenomegaly and portosystemic collateral pathways, including gastric and esophageal varices.  3. Colonic diverticulosis without evidence of acute diverticulitis at this time. 4. Additional incidental findings, as above.   01/16/2018 Tumor Marker   Patient's tumor was tested for the following markers: CA125 Results of the tumor marker test revealed 63.7   02/10/2018 Procedure   EGD - Recently bleeding grade III and large (> 5 mm) esophageal varices. Completely eradicated. Banded. - Portal hypertensive gastropathy. - Normal examined duodenum. - No specimens collected   02/13/2018 Tumor Marker   Patient's tumor was tested for the following markers: CA125 Results of the tumor marker  test revealed 82.7   02/25/2018 Imaging   Bone density showed mild osteopenia   04/08/2018 Tumor Marker   Patient's tumor was tested for the following markers: CA125 Results of the tumor marker test revealed 82.7   04/08/2018 Imaging   1. No definite omental or peritoneal surface lesions. However, there are 2 slowly enlarging lymph nodes noted. One is in the small bowel mesentery and the other is in the left deep pelvis. Could not exclude recurrent disease. 2. Stable advanced cirrhotic changes involving the liver with portal venous hypertension, portal venous collaterals, esophageal varices and splenomegaly. No worrisome hepatic lesions. 3. Diffuse wall thickening of the colon could suggest diffuse colitis or could be due to low albumin. Recommend correlation with any symptoms such as diarrhea.   05/21/2018 Tumor Marker    Patient's tumor was tested for the following markers: CA125 Results of the tumor marker test revealed 86   09/07/2018 Tumor Marker   Patient's tumor was tested for the following markers:CA-125 Results of the tumor marker test revealed 111   09/29/2018 Tumor Marker   Patient's tumor was tested for the following markers: CA-125 Results of the tumor marker test revealed 121.   09/29/2018 - 11/24/2018 Chemotherapy   The patient had carboplatin and gemzar for chemotherapy treatment.  Chemo was stopped due to allergic reaction to carboplatin   10/27/2018 Tumor Marker   Patient's tumor was tested for the following markers: CA-125 Results of the tumor marker test revealed 99.9   11/24/2018 Tumor Marker   Patient's tumor was tested for the following markers: CA-125 Results of the tumor marker test revealed 81.2   12/14/2018 Tumor Marker   Patient's tumor was tested for the following markers: CA-125 Results of the tumor marker test revealed 81.2   12/14/2018 Imaging   1. Mild improvement in peritoneal disease. Peritoneal nodule within the left posterior pelvis contiguous with the left side of vaginal cuff is slightly decreased in size in the interval. Within the right iliac fossa there is a small peritoneal nodule which has decreased in size in the interval. Central small bowel mesenteric lymph node is not significantly changed. Slight decrease in size left periaortic lymph node.    2. Morphologic features of the liver compatible with cirrhosis. Splenomegaly is noted which may reflect portal venous hypertension.   12/21/2018 Tumor Marker   Patient's tumor was tested for the following markers: CA-125 Results of the tumor marker test revealed 64.2   12/21/2018 -  Chemotherapy   The patient had taxotere for chemotherapy treatment.     03/02/2019 Tumor Marker   Patient's tumor was tested for the following markers: CA-125. Results of the tumor marker test revealed 36.3   03/22/2019 Imaging    Mild peritoneal carcinoma shows further improvement since previous study.   No new or progressive metastatic disease within the abdomen or pelvis.   Hepatic cirrhosis and findings of portal venous hypertension. 1.1 cm low-attenuation lesion within left hepatic lobe, which could represent a regenerative nodule or small hepatocellular carcinoma. Recommend abdomen MRI without and with contrast for further characterization.     04/13/2019 Tumor Marker   Patient's tumor was tested for the following markers: CA-125 Results of the tumor marker test revealed 36.8   05/26/2019 Tumor Marker   Patient's tumor was tested for the following markers: CA-125 Results of the tumor marker test revealed 31.6.   06/15/2019 Tumor Marker   Patient's tumor was tested for the following markers: CA-125 Results of the tumor marker test revealed  25.4   07/05/2019 Imaging   1. Stable CT examination. Stable peritoneal thickening in the pelvis and paracolic gutters. Stable solid left deep pelvic implant. Stable trace ascites. No new or progressive metastatic disease. 2. Stable cirrhosis. No discrete liver masses. 3. Stable mild-to-moderate splenomegaly. 4. Aortic Atherosclerosis (ICD10-I70.0).   07/27/2019 Tumor Marker   Patient's tumor was tested for the following markers: CA-125 Results of the tumor marker test revealed 35.4   08/17/2019 Tumor Marker   Patient's tumor was tested for the following markers: Ca-125 Results of the tumor marker test revealed 35.7.   09/17/2019 - 09/21/2019 Hospital Admission   She was admitted due to esophageal variceal bleeding presents with hematemesis and melanotic stool with epigastric pain.  GI consulted, started on octreotide infusion.  Underwent EGD which showed minimal bleeding, remain on octreotide drip, PPI drip.  Hemoglobin trended down therefore received PRBC transfusion.  Over several days her bleeding subsided, hemodynamically remained stable.    09/17/2019 Procedure   EGD  report - Esophageal mucosal scarring, from prior esophageal banding sessions. - Grade I esophageal varices. - Red blood in the cardia and in the gastric fundus. - Portal hypertensive gastropathy. - Normal duodenal bulb, first portion of the duodenum and second portion of the duodenum. 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 2. Otherwise stable CT exam. Stable peritoneal thickening within the bilateral deep pelvic peritoneum. Stable left deep pelvic implant.No evidence for progressed disease. 3. Stable splenomegaly. 4. Morphologic changes to the liver compatible with cirrhosis. Trace perihepatic and pelvic ascites.   02/22/2020 Imaging   1. New lucent lesion in the left side of the L4 vertebral body measuring 1 cm, concerning for possible osseous metastasis. Consideration for further evaluation with bone scan is suggested. Close attention on follow-up imaging is also recommended to ensure stability. 2. Previously noted peritoneal thickening and nodularity in the low anatomic pelvis has regressed slightly. 3. Borderline enlarged and mildly enlarged pelvic and retroperitoneal lymph nodes appear similar to slightly increased compared to the prior examination, as above. Continued attention on follow-up studies is recommended.  4. 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 5. Aortic atherosclerosis. 6. Additional incidental findings, as above.   02/22/2020 Tumor Marker   Patient's tumor was tested for the following markers: CA-125 Results  of the tumor marker test revealed 30.1     REVIEW OF SYSTEMS:   Constitutional: Denies fevers, chills or abnormal weight loss Eyes: Denies blurriness of vision Ears, nose, mouth, throat, and face: Denies mucositis or sore throat Respiratory: Denies cough, dyspnea or wheezes Cardiovascular: Denies palpitation, chest discomfort or lower extremity swelling Gastrointestinal:  Denies nausea, heartburn or change in bowel habits Skin: Denies abnormal skin rashes Lymphatics: Denies new lymphadenopathy or easy bruising Neurological:Denies numbness, tingling or new weaknesses Behavioral/Psych: Mood is stable, no new changes  All other systems were reviewed with the patient and are negative.  I have reviewed the past medical history, past surgical history, social history and family history with the patient and they are unchanged from previous note.  ALLERGIES:  is allergic to carboplatin, shellfish allergy, vancomycin, benadryl [diphenhydramine hcl], and penicillins.  MEDICATIONS:  Current Outpatient Medications  Medication Sig Dispense Refill  . calcium carbonate (TUMS - DOSED IN MG ELEMENTAL CALCIUM) 500 MG chewable tablet Chew 1 tablet by mouth 2 (two) times daily.    . cholecalciferol (VITAMIN D) 1000 units tablet Take 1,000 Units by mouth at bedtime.     Marland Kitchen dexamethasone (DECADRON) 4 MG tablet Start the day before Taxotere, take 1 tablet in the morning, and then 1 tablet daily after chemo for 2 days. Pls dispense 30 tabs (Patient taking differently: Take 4 mg by mouth See admin instructions. Start the day before Taxotere, take 1 tablet in the morning, and then 1 tablet daily after chemo for 2 days. Pls dispense 30 tabs) 30 tablet 1  . gabapentin (NEURONTIN) 300 MG capsule TAKE 2 CAPSULES(600 MG) BY MOUTH TWICE DAILY 120 capsule 11  . HUMALOG MIX 75/25 KWIKPEN (75-25) 100 UNIT/ML Kwikpen Inject 22 Units into the skin 2 (two) times daily. Before breakfast & before supper Except when taking Chemo  take 32 units  For three to five days    . HYDROcodone-acetaminophen (NORCO/VICODIN) 5-325 MG tablet Take 1 tablet by mouth every 6 (six) hours as needed for moderate pain. 60 tablet 0  . lidocaine-prilocaine (EMLA) cream Apply to Porta-cath  1-2 hours prior to access as directed. (Patient taking differently: Apply 1 application topically daily as needed (port access). ) 30 g 9  . nadolol (CORGARD) 20 MG tablet Take 1 tablet (20 mg total) by mouth daily. (Patient taking differently: Take 20 mg by mouth at bedtime. ) 90 tablet 3  . ondansetron (ZOFRAN) 8 MG tablet Take 1 tablet (8 mg total) by mouth every 8 (eight) hours as needed. 60 tablet 1  . pantoprazole (PROTONIX) 40 MG tablet Take 1 tablet (40 mg total) by mouth daily.    Marland Kitchen PROAIR HFA 108 (90 Base) MCG/ACT inhaler Inhale 2 puffs into the lungs every 4 (four) hours as needed for wheezing or shortness of breath.     . prochlorperazine (COMPAZINE) 10 MG tablet Take 1 tablet (10 mg total) by mouth every 6 (six) hours as needed (Nausea or vomiting). 30 tablet 1  . rotigotine (NEUPRO) 4 MG/24HR APPLY 1 PATCH EXTERNALLY TO THE SKIN DAILY 30 patch 6   No current facility-administered medications for this visit.    PHYSICAL EXAMINATION: ECOG PERFORMANCE STATUS: 1 - Symptomatic but completely ambulatory  Vitals:   04/06/20 0822  BP: (!) 155/68  Pulse: 72  Resp: 18  Temp: (!) 97.2 F (36.2 C)  SpO2: 96%   Filed Weights   04/06/20 1443  Weight: 196 lb 12.8 oz (89.3 kg)    GENERAL:alert, no distress and comfortable SKIN: skin color, texture, turgor are normal, no rashes or significant lesions EYES: normal, Conjunctiva are pink and non-injected, sclera clear OROPHARYNX:no exudate, no erythema and lips, buccal mucosa, and tongue normal  NECK: supple, thyroid normal size, non-tender, without nodularity LYMPH:  no palpable lymphadenopathy in the cervical, axillary or inguinal LUNGS: clear to auscultation and percussion with normal breathing  effort HEART: regular rate & rhythm and no murmurs and no lower extremity edema ABDOMEN:abdomen soft, non-tender and normal bowel sounds Musculoskeletal:no cyanosis of digits and no clubbing  NEURO: alert & oriented x 3 with fluent speech, no focal motor/sensory deficits  LABORATORY DATA:  I have reviewed the data as listed    Component Value Date/Time   NA 142 04/06/2020 0813   NA 142 02/06/2017 0742   K 4.4 04/06/2020 0813   K 3.9  02/06/2017 0742   CL 108 04/06/2020 0813   CO2 25 04/06/2020 0813   CO2 22 02/06/2017 0742   GLUCOSE 103 (H) 04/06/2020 0813   GLUCOSE 156 (H) 02/06/2017 0742   BUN 17 04/06/2020 0813   BUN 13.5 02/06/2017 0742   CREATININE 0.91 04/06/2020 0813   CREATININE 0.85 10/26/2019 0722   CREATININE 0.8 02/06/2017 0742   CALCIUM 8.7 (L) 04/06/2020 0813   CALCIUM 8.5 02/06/2017 0742   PROT 7.7 04/06/2020 0813   PROT 6.9 02/06/2017 0742   ALBUMIN 3.8 04/06/2020 0813   ALBUMIN 3.7 02/06/2017 0742   AST 34 04/06/2020 0813   AST 24 10/26/2019 0722   AST 47 (H) 02/06/2017 0742   ALT 35 04/06/2020 0813   ALT 17 10/26/2019 0722   ALT 54 02/06/2017 0742   ALKPHOS 138 (H) 04/06/2020 0813   ALKPHOS 130 02/06/2017 0742   BILITOT 0.7 04/06/2020 0813   BILITOT 0.7 10/26/2019 0722   BILITOT 0.65 02/06/2017 0742   GFRNONAA >60 04/06/2020 0813   GFRNONAA >60 10/26/2019 0722   GFRAA >60 11/09/2019 0953   GFRAA >60 10/26/2019 0722    No results found for: SPEP, UPEP  Lab Results  Component Value Date   WBC 4.3 04/06/2020   NEUTROABS 2.9 04/06/2020   HGB 12.3 04/06/2020   HCT 37.0 04/06/2020   MCV 92.5 04/06/2020   PLT 90 (L) 04/06/2020      Chemistry      Component Value Date/Time   NA 142 04/06/2020 0813   NA 142 02/06/2017 0742   K 4.4 04/06/2020 0813   K 3.9 02/06/2017 0742   CL 108 04/06/2020 0813   CO2 25 04/06/2020 0813   CO2 22 02/06/2017 0742   BUN 17 04/06/2020 0813   BUN 13.5 02/06/2017 0742   CREATININE 0.91 04/06/2020 0813    CREATININE 0.85 10/26/2019 0722   CREATININE 0.8 02/06/2017 0742      Component Value Date/Time   CALCIUM 8.7 (L) 04/06/2020 0813   CALCIUM 8.5 02/06/2017 0742   ALKPHOS 138 (H) 04/06/2020 0813   ALKPHOS 130 02/06/2017 0742   AST 34 04/06/2020 0813   AST 24 10/26/2019 0722   AST 47 (H) 02/06/2017 0742   ALT 35 04/06/2020 0813   ALT 17 10/26/2019 0722   ALT 54 02/06/2017 0742   BILITOT 0.7 04/06/2020 0813   BILITOT 0.7 10/26/2019 0722   BILITOT 0.65 02/06/2017 7544

## 2020-04-06 NOTE — Assessment & Plan Note (Signed)
She is taking chronic hydrocodone for chronic back pain I refill her prescription today We discussed narcotic refill policy

## 2020-04-07 ENCOUNTER — Other Ambulatory Visit: Payer: Self-pay | Admitting: Hematology and Oncology

## 2020-04-07 ENCOUNTER — Telehealth: Payer: Self-pay

## 2020-04-07 LAB — CA 125: Cancer Antigen (CA) 125: 35.7 U/mL (ref 0.0–38.1)

## 2020-04-07 MED ORDER — HYDROCODONE-ACETAMINOPHEN 5-325 MG PO TABS: 1.0000 | ORAL_TABLET | Freq: Four times a day (QID) | ORAL | 0 refills | Status: DC | PRN

## 2020-04-07 NOTE — Telephone Encounter (Signed)
-----   Message from [name], MD sent at 04/07/2020  9:29 AM EST ----- Regarding: lab Let her know CA-125 is stable Her prescription went through today

## 2020-04-07 NOTE — Telephone Encounter (Signed)
Called and left below message. Ask her to call the office back for questions.

## 2020-04-11 ENCOUNTER — Encounter: Payer: Self-pay | Admitting: Orthopaedic Surgery

## 2020-04-11 ENCOUNTER — Ambulatory Visit (INDEPENDENT_AMBULATORY_CARE_PROVIDER_SITE_OTHER): Payer: Medicare Other | Admitting: Orthopaedic Surgery

## 2020-04-11 ENCOUNTER — Other Ambulatory Visit: Payer: Self-pay

## 2020-04-11 ENCOUNTER — Ambulatory Visit (INDEPENDENT_AMBULATORY_CARE_PROVIDER_SITE_OTHER): Payer: Medicare Other

## 2020-04-11 DIAGNOSIS — M25532 Pain in left wrist: Secondary | ICD-10-CM

## 2020-04-11 NOTE — Progress Notes (Signed)
Office Visit Note   Patient: Olivia Benson           Date of Birth: 22-Jan-1946           MRN: [id number] Visit Date: 04/11/2020              Requested by: Gregor Hams, [address] [place],  [place] [id number] PCP: Gregor Hams, FNP   Assessment & Plan: Visit Diagnoses:  1. Pain in left wrist     Plan: Impression is probable left wrist de Quervain's tenosynovitis but due to her current history of cancer and questionable lytic lesions on imaging, we will obtain an urgent MRI of the left wrist.  She will follow up with [place] once has been completed.  In the meantime, we have provided her with a removable thumb spica splint.  Follow-Up Instructions: Return for after MRI.   Orders:  Orders Placed This Encounter  Procedures  . XR Wrist Complete Left  . MR Wrist Left w/o contrast   No orders of the defined types were placed in this encounter.     Procedures: No procedures performed   Clinical Data: No additional findings.   Subjective: Chief Complaint  Patient presents with  . Left Wrist - Pain    HPI patient is a very pleasant 75 year old left-hand-dominant female who comes in today with left wrist pain.  This began back in early January after helping her husband shovel snow.  The pain she has is over the first dorsal compartment.  The pain is worse when she is trying to pick things up or when she is using her thumb.  She has tried topical anti-inflammatories without relief of symptoms.  She does have active peritoneal carcinomatosis.  She is currently on a holiday from her chemo, but due to low platelets she is unable to take anti-inflammatories.  Tylenol does not significantly help.    Review of Systems as detailed in HPI.  All others reviewed and are negative.   Objective: Vital Signs: There were no vitals taken for this visit.  Physical Exam well-developed well-nourished female no acute distress.  Alert oriented x3.  Ortho Exam examination of  her left wrist reveals marked tenderness over the first dorsal compartment with a markedly positive Wynn Maudlin.  Negative grind test.  She is neurovascular intact distally.  Specialty Comments:  No specialty comments available.  Imaging: XR Wrist Complete Left  Result Date: 04/11/2020 X-rays of the left wrist show a lucency throughout the distal radius and ulna concerning for possible lytic lesion     PMFS History: Patient Active Problem List   Diagnosis Date Noted  . Bone lesion 02/23/2020  . 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 Acute GI bleeding 09/17/2019  . Bilateral edema of lower extremity 04/14/2019  . Preventive measure 03/03/2019  . Syncope, vasovagal 01/04/2019  . Phlebitis after infusion 01/04/2019  . Acute lower UTI 01/04/2019  . Peritoneal carcinomatosis ([place]) 01/04/2019  . AKI (acute kidney injury) ([place]) 12/31/2018  . SAH (subarachnoid hemorrhage) ([place]) 12/31/2018  . Antineoplastic chemotherapy induced pancytopenia ([place]) 12/31/2018  . Severe sepsis with septic shock ([place]) 12/31/2018  . Diarrhea 12/31/2018  . Redness and swelling of upper arm, right 12/31/2018  . Hypocalcemia 12/31/2018  . Coagulopathy ([place])   . Mucositis due to antineoplastic therapy   . Esophageal varices ([place]) 03/23/2018  . Chronic diastolic CHF (congestive heart failure) ([place]) 03/09/2018  . Fall 03/08/2018  . Displaced fracture of distal end  of right fibula 03/08/2018  . GIB (gastrointestinal bleeding) 02/09/2018  . Obesity due to excess calories 03/20/2017  . Cardiomyopathy ([place]) 01/24/2017  . Sinus congestion 01/24/2017  . 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 Goals of care, counseling/discussion 09/03/2016  . Thrombocytopenia ([place]) 04/15/2016  . Vasomotor instability 01/17/2016  . Osteopenia determined by x-ray 12/22/2015  . Encounter for antineoplastic chemotherapy 12/09/2015  . Chemotherapy-induced neuropathy ([place]) 10/01/2015  . Elevated tumor  markers 10/01/2015  . Obesity (BMI 35.0-39.9 without comorbidity) 05/31/2015  . Port catheter in place 03/29/2015  . Iron deficiency anemia 03/05/2015  . Low back pain 03/05/2015  . Neuropathic pain of foot 11/21/2014  . Anemia associated with chemotherapy 07/03/2014  . Diabetes mellitus without complication ([place]) 14/97/0263  . Portacath in place 05/10/2014  . Leukopenia due to antineoplastic chemotherapy ([place]) 05/10/2014  . Splenomegaly 05/10/2014  . Primary peritoneal carcinomatosis ([place]) 05/08/2014  . Malignant neoplasm of peritoneum ([place]) 05/08/2014  . Genetic testing 03/16/2014  . Neutropenic fever ([place]) 01/27/2014  . Pancytopenia, acquired ([place]) 01/27/2014  . Fever and neutropenia ([place]) 01/27/2014  . Asthma 01/27/2014  . Malignant ascites 01/25/2014  . Chemotherapy induced neutropenia ([place]) 01/25/2014  . Chemotherapy-induced thrombocytopenia 01/25/2014  . RLS (restless legs syndrome) 01/25/2014  . Liver cirrhosis secondary to NASH ([place]) 01/02/2014  . Nonalcoholic steatohepatitis ([facility]) 01/02/2014  . 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 Diabetes mellitus with diabetic neuropathy, with long-term current use of insulin ([place]) 12/03/2013  . Ascites 11/15/2013  . Elevated cancer antigen 125 (CA-125) 11/15/2013  . Cough variant asthma vs UACS 09/02/2013  . Essential hypertension 09/02/2013  . Dyspnea 10/22/2012  . Fatty liver disease, nonalcoholic 78/58/8502  . S/P cardiac cath 11/06/2010  . Personal history of surgery to heart and great vessels, presenting hazards to health 11/06/2010   Past Medical History:  Diagnosis Date  . Asthma   . CHF (congestive heart failure) ([place])   . Cirrhosis ([place])   . Complication of anesthesia    slow to wake up / n & v  . Diabetes mellitus   . Elevated LFTs   . Family history of adverse reaction to anesthesia    multiple family members have had problems such as slow to wake / "flat lined" /  ventilator    . Frequency of  urination   . Glaucoma, narrow-angle   . History of skin cancer   . Hypertension   . Non-ST elevated myocardial infarction (non-STEMI) [facility]) Sept 2012   Mild elevation in troponin and CK MB but no evidence of coronary disease at time of cath; per patient " i had undiagnosed heart event but not a heart attack"   . Obesity   . Ovarian cancer ([place]) 11/22/2013   Disseminated ovarian cancer; 09-25-2018   "my cancer is back and im restarting chemo on tuesday 09-25-2018]  . Overactive bladder   . PONV (postoperative nausea and vomiting)    no problems with feb  egd   . Portal hypertension ([place])   . Restless leg syndrome   . Tingling    hands and feet    Family History  Problem Relation Age of Onset  . Asthma Mother   . Diabetes Mother   . Hypertension Mother   . Allergies Mother   . Rheum arthritis Mother   . Bladder Cancer Father 68       former heavy smoker  . Hypertension Father   . Stroke Father   . Breast cancer Sister 39  .  Diabetes Sister        x 3  . Allergies Sister   . Asthma Sister        x 2   . Breast cancer Maternal Aunt 85  . Breast cancer Cousin 25       (maternal) bilateral cancer  . Aneurysm Son 41  . Diabetes Brother   . Allergies Brother   . Asthma Brother     Past Surgical History:  Procedure Laterality Date  . ABDOMINAL ULTRASOUND  Sept 2012   No gallbladder disease, + fatty liver  . CARDIAC CATHETERIZATION  10/25/2010   EF 60%; Normal coronaries  . COLONOSCOPY  10/18/2005  . COLONOSCOPY WITH PROPOFOL N/A 09/28/2018   Procedure: COLONOSCOPY WITH PROPOFOL;  Surgeon: Ronnette Juniper, MD;  Location: [facility];  Service: Gastroenterology;  Laterality: N/A;  . COSMETIC SURGERY  2003  . DENTAL SURGERY    . ESOPHAGEAL BANDING  02/10/2018   Procedure: ESOPHAGEAL BANDING;  Surgeon: Ronnette Juniper, MD;  Location: [facility];  Service: Gastroenterology;;  . ESOPHAGEAL BANDING N/A 03/23/2018   Procedure: ESOPHAGEAL BANDING;  Surgeon: Ronnette Juniper, MD;  [facility]: [facility];  Service: Gastroenterology;  Laterality: N/A;  . ESOPHAGEAL BANDING N/A 09/28/2018   Procedure: ESOPHAGEAL BANDING;  Surgeon: Ronnette Juniper, MD;  [facility]: [facility];  Service: Gastroenterology;  Laterality: N/A;  . ESOPHAGEAL BANDING  03/01/2019   Procedure: ESOPHAGEAL BANDING;  Surgeon: Ronnette Juniper, MD;  [facility];  Service: Gastroenterology;;  . ESOPHAGOGASTRODUODENOSCOPY N/A 03/23/2018   Procedure: ESOPHAGOGASTRODUODENOSCOPY (EGD);  Surgeon: Ronnette Juniper, MD;  Location: [facility];  Service: Gastroenterology;  Laterality: N/A;  . ESOPHAGOGASTRODUODENOSCOPY N/A 09/17/2019   Procedure: ESOPHAGOGASTRODUODENOSCOPY (EGD);  Surgeon: Arta Silence, MD;  Location: [facility];  Service: Endoscopy;  Laterality: N/A;  . ESOPHAGOGASTRODUODENOSCOPY (EGD) WITH PROPOFOL N/A 02/10/2018   Procedure: ESOPHAGOGASTRODUODENOSCOPY (EGD) WITH PROPOFOL;  Surgeon: Ronnette Juniper, MD;  [facility];  Service: Gastroenterology;  Laterality: N/A;  . ESOPHAGOGASTRODUODENOSCOPY (EGD) WITH PROPOFOL N/A 09/28/2018   Procedure: ESOPHAGOGASTRODUODENOSCOPY (EGD) WITH PROPOFOL;  Surgeon: Ronnette Juniper, MD;  [facility];  Service: Gastroenterology;  Laterality: N/A;  . ESOPHAGOGASTRODUODENOSCOPY (EGD) WITH PROPOFOL N/A 03/01/2019   Procedure: ESOPHAGOGASTRODUODENOSCOPY (EGD) WITH PROPOFOL with banding;  Surgeon: Ronnette Juniper, MD;  [facility];  Service: Gastroenterology;  Laterality: N/A;  . ESOPHAGOGASTRODUODENOSCOPY (EGD) WITH PROPOFOL N/A 05/24/2019   Procedure: ESOPHAGOGASTRODUODENOSCOPY (EGD) WITH PROPOFOL;  Surgeon: Ronnette Juniper, MD;  [facility];  Service: Gastroenterology;  Laterality: N/A;  . EYE SURGERY Bilateral 09/19/14 R eye, 08/2014 L eye   open angles in both eyes d/t narrow angle glaucoma  . GASTRIC VARICES BANDING N/A 05/24/2019   Procedure: GASTRIC VARICES BANDING;  Surgeon: Ronnette Juniper, MD;  [facility];  Service: Gastroenterology;  Laterality:  N/A;  . LAPAROTOMY N/A 03/29/2014   Procedure: EXPLORATORY LAPAROTOMY;  Surgeon: Everitt Amber, MD;  Location: [facility];  Service: Gynecology;  Laterality: N/A;  . ORIF ANKLE FRACTURE Right 03/09/2018   Procedure: OPEN REDUCTION INTERNAL FIXATION (ORIF) RIGHT LATERAL MALLEOLUS;  Surgeon: Leandrew Koyanagi, MD;  Location: [facility];  Service: Orthopedics;  Laterality: Right;  . PARTIAL HYSTERECTOMY  1988  . POLYPECTOMY  09/28/2018   Procedure: POLYPECTOMY;  Surgeon: Ronnette Juniper, MD;  [facility];  Service: Gastroenterology;;  . SALPINGOOPHORECTOMY Bilateral 03/29/2014   Procedure: BILATERAL SALPINGO OOPHORECTOMY, OMENTECTOMY, DEBULKING;  Surgeon: Everitt Amber, MD;  Location: [facility];  Service: Gynecology;  Laterality: Bilateral;  . TONSILLECTOMY  1971  . TUBAL LIGATION    .  [facility]  11/24/2009   Social History   Occupational History  . Occupation: Retired  Tobacco Use  . Smoking status: Former Smoker    Packs/day: 1.00    Years: 1.00    Pack years: 1.00    Types: Cigarettes    Quit date: 02/11/1982    Years since quitting: 38.1  . Smokeless tobacco: Never Used  Vaping Use  . Vaping Use: Never used  Substance and Sexual Activity  . Alcohol use: No    Alcohol/week: 0.0 standard drinks  . Drug use: No  . Sexual activity: Not on file

## 2020-04-12 ENCOUNTER — Ambulatory Visit
Admission: RE | Admit: 2020-04-12 | Discharge: 2020-04-12 | Disposition: A | Discharge status: Home / Self Care | Payer: Medicare Other | Source: Ambulatory Visit | Attending: Orthopaedic Surgery | Admitting: Orthopaedic Surgery

## 2020-04-12 DIAGNOSIS — M25532 Pain in left wrist: Secondary | ICD-10-CM

## 2020-04-12 NOTE — Progress Notes (Signed)
Needs appt

## 2020-04-18 ENCOUNTER — Other Ambulatory Visit: Payer: Self-pay

## 2020-04-18 ENCOUNTER — Encounter: Payer: Self-pay | Admitting: Orthopaedic Surgery

## 2020-04-18 ENCOUNTER — Ambulatory Visit (INDEPENDENT_AMBULATORY_CARE_PROVIDER_SITE_OTHER): Payer: Medicare Other | Admitting: Orthopaedic Surgery

## 2020-04-18 DIAGNOSIS — M654 Radial styloid tenosynovitis [de Quervain]: Secondary | ICD-10-CM | POA: Insufficient documentation

## 2020-04-18 MED ORDER — LIDOCAINE HCL 1 % IJ SOLN
0.3000 mL | INTRAMUSCULAR | Status: AC | PRN
Start: 2020-04-18 — End: 2020-04-18
  Administered 2020-04-18: .3 mL

## 2020-04-18 MED ORDER — METHYLPREDNISOLONE ACETATE 40 MG/ML IJ SUSP
13.3300 mg | INTRAMUSCULAR | Status: AC | PRN
  Administered 2020-04-18: 13.33 mg

## 2020-04-18 MED ORDER — BUPIVACAINE HCL 0.5 % IJ SOLN
0.3300 mL | INTRAMUSCULAR | Status: AC | PRN
  Administered 2020-04-18: .33 mL

## 2020-04-18 NOTE — Progress Notes (Signed)
Office Visit Note   Patient: Olivia Benson           Date of Birth: 04/04/1945           MRN: [id number] Visit Date: 04/18/2020              Requested by: Gregor Hams, [address] [place],  [place] [id number] PCP: Gregor Hams, FNP   Assessment & Plan: Visit Diagnoses:  1. De Quervain's tenosynovitis, left     Plan: MRI is negative for any malignancy.  Does confirm de Quervain's tenosynovitis.  Injection performed today.  Patient tolerates well.  She will continue to wear the thumb spica brace until she notices improvement from the injection.  Follow-up as needed.  Follow-Up Instructions: Return if symptoms worsen or fail to improve.   Orders:  No orders of the defined types were placed in this encounter.  No orders of the defined types were placed in this encounter.     Procedures: Hand/UE Inj: L extensor compartment 1 for de Quervain's tenosynovitis on 04/18/2020 3:35 PM Indications: pain Details: 25 G needle Medications: 0.3 mL lidocaine 1 %; 0.33 mL bupivacaine 0.5 %; 13.33 mg methylPREDNISolone acetate 40 MG/ML Outcome: tolerated well, no immediate complications Patient was prepped and draped in the usual sterile fashion.       Clinical Data: No additional findings.   Subjective: Chief Complaint  Patient presents with  . Left Wrist - Pain, Follow-up    [name] returns today for MRI review of the left wrist to rule out malignancy.  De Quervain's tenosynovitis is stable.   Review of Systems   Objective: Vital Signs: There were no vitals taken for this visit.  Physical Exam  Ortho Exam Left wrist exam shows tenderness over the radial styloid and positive Finkelstein's.  Mild swelling. Specialty Comments:  No specialty comments available.  Imaging: No results found.   PMFS History: Patient Active Problem List   Diagnosis Date Noted  . De Quervain's tenosynovitis, left 04/18/2020  . Bone lesion 02/23/2020  . 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 Acute GI bleeding 09/17/2019  . Bilateral edema of lower extremity 04/14/2019  . Preventive measure 03/03/2019  . Syncope, vasovagal 01/04/2019  . Phlebitis after infusion 01/04/2019  . Acute lower UTI 01/04/2019  . Peritoneal carcinomatosis ([place]) 01/04/2019  . AKI (acute kidney injury) ([place]) 12/31/2018  . SAH (subarachnoid hemorrhage) ([place]) 12/31/2018  . Antineoplastic chemotherapy induced pancytopenia ([place]) 12/31/2018  . Severe sepsis with septic shock ([place]) 12/31/2018  . Diarrhea 12/31/2018  . Redness and swelling of upper arm, right 12/31/2018  . Hypocalcemia 12/31/2018  . Coagulopathy ([place])   . Mucositis due to antineoplastic therapy   . Esophageal varices ([place]) 03/23/2018  . Chronic diastolic CHF (congestive heart failure) ([place]) 03/09/2018  . Fall 03/08/2018  . Displaced fracture of distal end of right fibula 03/08/2018  . GIB (gastrointestinal bleeding) 02/09/2018  . Obesity due to excess calories 03/20/2017  . Cardiomyopathy ([place]) 01/24/2017  . Sinus congestion 01/24/2017  . 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 Goals of care, counseling/discussion 09/03/2016  . Thrombocytopenia ([place]) 04/15/2016  . Vasomotor instability 01/17/2016  . Osteopenia determined by x-ray 12/22/2015  . Encounter for antineoplastic chemotherapy 12/09/2015  . Chemotherapy-induced neuropathy ([place]) 10/01/2015  . Elevated tumor markers 10/01/2015  . Obesity (BMI 35.0-39.9 without comorbidity) 05/31/2015  . Port catheter in place 03/29/2015  . Iron deficiency anemia 03/05/2015  . Low back pain 03/05/2015  .  Neuropathic pain of foot 11/21/2014  . Anemia associated with chemotherapy 07/03/2014  . Diabetes mellitus without complication ([place]) 84/13/2440  . Portacath in place 05/10/2014  . Leukopenia due to antineoplastic chemotherapy ([place]) 05/10/2014  . Splenomegaly 05/10/2014  . Primary peritoneal carcinomatosis ([place]) 05/08/2014  .  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 Genetic testing 03/16/2014  . Neutropenic fever ([place]) 01/27/2014  . Pancytopenia, acquired ([place]) 01/27/2014  . Fever and neutropenia ([place]) 01/27/2014  . Asthma 01/27/2014  . Malignant ascites 01/25/2014  . Chemotherapy induced neutropenia ([place]) 01/25/2014  . Chemotherapy-induced thrombocytopenia 01/25/2014  . RLS (restless legs syndrome) 01/25/2014  . Liver cirrhosis secondary to NASH ([place]) 01/02/2014  . Nonalcoholic steatohepatitis ([facility]) 01/02/2014  . 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 Diabetes mellitus with diabetic neuropathy, with long-term current use of insulin ([place]) 12/03/2013  . Ascites 11/15/2013  . Elevated cancer antigen 125 (CA-125) 11/15/2013  . Cough variant asthma vs UACS 09/02/2013  . Essential hypertension 09/02/2013  . Dyspnea 10/22/2012  . Fatty liver disease, nonalcoholic 12/08/2534  . S/P cardiac cath 11/06/2010  . Personal history of surgery to heart and great vessels, presenting hazards to health 11/06/2010   Past Medical History:  Diagnosis Date  . Asthma   . CHF (congestive heart failure) ([place])   . Cirrhosis ([place])   . Complication of anesthesia    slow to wake up / n & v  . Diabetes mellitus   . Elevated LFTs   . Family history of adverse reaction to anesthesia    multiple family members have had problems such as slow to wake / "flat lined" /  ventilator    . Frequency of urination   . Glaucoma, narrow-angle   . History of skin cancer   . Hypertension   . Non-ST elevated myocardial infarction (non-STEMI) [facility]) Sept 2012   Mild elevation in troponin and CK MB but no evidence of coronary disease at time of cath; per patient " i had undiagnosed heart event but not a heart attack"   . Obesity   . Ovarian cancer ([place]) 11/22/2013   Disseminated ovarian cancer; 09-25-2018   "my cancer is back and im restarting chemo on tuesday 09-25-2018]  . Overactive bladder   . PONV (postoperative nausea  and vomiting)    no problems with feb  egd   . Portal hypertension ([place])   . Restless leg syndrome   . Tingling    hands and feet    Family History  Problem Relation Age of Onset  . Asthma Mother   . Diabetes Mother   . Hypertension Mother   . Allergies Mother   . Rheum arthritis Mother   . Bladder Cancer Father 94       former heavy smoker  . Hypertension Father   . Stroke Father   . Breast cancer Sister 59  . Diabetes Sister        x 3  . Allergies Sister   . Asthma Sister        x 2   . Breast cancer Maternal Aunt 85  . Breast cancer Cousin 55       (maternal) bilateral cancer  . Aneurysm Son 25  . Diabetes Brother   . Allergies Brother   . Asthma Brother     Past Surgical History:  Procedure Laterality Date  . ABDOMINAL ULTRASOUND  Sept 2012   No gallbladder disease, + fatty liver  . CARDIAC CATHETERIZATION  10/25/2010   EF 60%; Normal  coronaries  . COLONOSCOPY  10/18/2005  . COLONOSCOPY WITH PROPOFOL N/A 09/28/2018   Procedure: COLONOSCOPY WITH PROPOFOL;  Surgeon: Ronnette Juniper, MD;  Location: [facility];  Service: Gastroenterology;  Laterality: N/A;  . COSMETIC SURGERY  2003  . DENTAL SURGERY    . ESOPHAGEAL BANDING  02/10/2018   Procedure: ESOPHAGEAL BANDING;  Surgeon: Ronnette Juniper, MD;  Location: [facility];  Service: Gastroenterology;;  . ESOPHAGEAL BANDING N/A 03/23/2018   Procedure: ESOPHAGEAL BANDING;  Surgeon: Ronnette Juniper, MD;  [facility];  Service: Gastroenterology;  Laterality: N/A;  . ESOPHAGEAL BANDING N/A 09/28/2018   Procedure: ESOPHAGEAL BANDING;  Surgeon: Ronnette Juniper, MD;  [facility]: [facility];  Service: Gastroenterology;  Laterality: N/A;  . ESOPHAGEAL BANDING  03/01/2019   Procedure: ESOPHAGEAL BANDING;  Surgeon: Ronnette Juniper, MD;  [facility];  Service: Gastroenterology;;  . ESOPHAGOGASTRODUODENOSCOPY N/A 03/23/2018   Procedure: ESOPHAGOGASTRODUODENOSCOPY (EGD);  Surgeon: Ronnette Juniper, MD;  Location: [facility];  Service:  Gastroenterology;  Laterality: N/A;  . ESOPHAGOGASTRODUODENOSCOPY N/A 09/17/2019   Procedure: ESOPHAGOGASTRODUODENOSCOPY (EGD);  Surgeon: Arta Silence, MD;  Location: [facility];  Service: Endoscopy;  Laterality: N/A;  . ESOPHAGOGASTRODUODENOSCOPY (EGD) WITH PROPOFOL N/A 02/10/2018   Procedure: ESOPHAGOGASTRODUODENOSCOPY (EGD) WITH PROPOFOL;  Surgeon: Ronnette Juniper, MD;  [facility];  Service: Gastroenterology;  Laterality: N/A;  . ESOPHAGOGASTRODUODENOSCOPY (EGD) WITH PROPOFOL N/A 09/28/2018   Procedure: ESOPHAGOGASTRODUODENOSCOPY (EGD) WITH PROPOFOL;  Surgeon: Ronnette Juniper, MD;  [facility];  Service: Gastroenterology;  Laterality: N/A;  . ESOPHAGOGASTRODUODENOSCOPY (EGD) WITH PROPOFOL N/A 03/01/2019   Procedure: ESOPHAGOGASTRODUODENOSCOPY (EGD) WITH PROPOFOL with banding;  Surgeon: Ronnette Juniper, MD;  [facility];  Service: Gastroenterology;  Laterality: N/A;  . ESOPHAGOGASTRODUODENOSCOPY (EGD) WITH PROPOFOL N/A 05/24/2019   Procedure: ESOPHAGOGASTRODUODENOSCOPY (EGD) WITH PROPOFOL;  Surgeon: Ronnette Juniper, MD;  [facility];  Service: Gastroenterology;  Laterality: N/A;  . EYE SURGERY Bilateral 09/19/14 R eye, 08/2014 L eye   open angles in both eyes d/t narrow angle glaucoma  . GASTRIC VARICES BANDING N/A 05/24/2019   Procedure: GASTRIC VARICES BANDING;  Surgeon: Ronnette Juniper, MD;  [facility];  Service: Gastroenterology;  Laterality: N/A;  . LAPAROTOMY N/A 03/29/2014   Procedure: EXPLORATORY LAPAROTOMY;  Surgeon: Everitt Amber, MD;  Location: [facility];  Service: Gynecology;  Laterality: N/A;  . ORIF ANKLE FRACTURE Right 03/09/2018   Procedure: OPEN REDUCTION INTERNAL FIXATION (ORIF) RIGHT LATERAL MALLEOLUS;  Surgeon: Leandrew Koyanagi, MD;  Location: [facility];  Service: Orthopedics;  Laterality: Right;  . PARTIAL HYSTERECTOMY  1988  . POLYPECTOMY  09/28/2018   Procedure: POLYPECTOMY;  Surgeon: Ronnette Juniper, MD;  [facility];  Service: Gastroenterology;;   . SALPINGOOPHORECTOMY Bilateral 03/29/2014   Procedure: BILATERAL SALPINGO OOPHORECTOMY, OMENTECTOMY, DEBULKING;  Surgeon: Everitt Amber, MD;  Location: [facility];  Service: Gynecology;  Laterality: Bilateral;  . TONSILLECTOMY  1971  . TUBAL LIGATION    . [facility]  11/24/2009   Social History   Occupational History  . Occupation: Retired  Tobacco Use  . Smoking status: Former Smoker    Packs/day: 1.00    Years: 1.00    Pack years: 1.00    Types: Cigarettes    Quit date: 02/11/1982    Years since quitting: 38.2  . Smokeless tobacco: Never Used  Vaping Use  . Vaping Use: Never used  Substance and Sexual Activity  . Alcohol use: No    Alcohol/week: 0.0 standard drinks  . Drug use: No  . Sexual activity: Not on file

## 2020-05-15 ENCOUNTER — Other Ambulatory Visit: Payer: Self-pay | Admitting: Gastroenterology

## 2020-05-22 ENCOUNTER — Inpatient Hospital Stay: Payer: Medicare Other | Attending: Hematology and Oncology

## 2020-05-22 ENCOUNTER — Other Ambulatory Visit: Payer: Self-pay

## 2020-05-22 ENCOUNTER — Inpatient Hospital Stay: Payer: Medicare Other

## 2020-05-22 ENCOUNTER — Ambulatory Visit (HOSPITAL_COMMUNITY)
Admission: RE | Admit: 2020-05-22 | Discharge: 2020-05-22 | Disposition: A | Discharge status: Home / Self Care | Payer: Medicare Other | Source: Ambulatory Visit | Attending: Hematology and Oncology | Admitting: Hematology and Oncology

## 2020-05-22 DIAGNOSIS — E1140 Type 2 diabetes mellitus with diabetic neuropathy, unspecified: Secondary | ICD-10-CM | POA: Insufficient documentation

## 2020-05-22 DIAGNOSIS — M549 Dorsalgia, unspecified: Secondary | ICD-10-CM | POA: Insufficient documentation

## 2020-05-22 DIAGNOSIS — Z9221 Personal history of antineoplastic chemotherapy: Secondary | ICD-10-CM | POA: Insufficient documentation

## 2020-05-22 DIAGNOSIS — C569 Malignant neoplasm of unspecified ovary: Secondary | ICD-10-CM

## 2020-05-22 DIAGNOSIS — K766 Portal hypertension: Secondary | ICD-10-CM | POA: Insufficient documentation

## 2020-05-22 DIAGNOSIS — C482 Malignant neoplasm of peritoneum, unspecified: Secondary | ICD-10-CM | POA: Diagnosis present

## 2020-05-22 DIAGNOSIS — K3189 Other diseases of stomach and duodenum: Secondary | ICD-10-CM | POA: Insufficient documentation

## 2020-05-22 DIAGNOSIS — D696 Thrombocytopenia, unspecified: Secondary | ICD-10-CM | POA: Insufficient documentation

## 2020-05-22 DIAGNOSIS — D509 Iron deficiency anemia, unspecified: Secondary | ICD-10-CM

## 2020-05-22 LAB — CBC WITH DIFFERENTIAL/PLATELET
Abs Immature Granulocytes: 0.02 10*3/uL (ref 0.00–0.07)
Basophils Absolute: 0 10*3/uL (ref 0.0–0.1)
Basophils Relative: 1 %
Eosinophils Absolute: 0.1 10*3/uL (ref 0.0–0.5)
Eosinophils Relative: 2 %
HCT: 37.8 % (ref 36.0–46.0)
Hemoglobin: 12.5 g/dL (ref 12.0–15.0)
Immature Granulocytes: 1 %
Lymphocytes Relative: 29 %
Lymphs Abs: 1.1 10*3/uL (ref 0.7–4.0)
MCH: 30.4 pg (ref 26.0–34.0)
MCHC: 33.1 g/dL (ref 30.0–36.0)
MCV: 92 fL (ref 80.0–100.0)
Monocytes Absolute: 0.2 10*3/uL (ref 0.1–1.0)
Monocytes Relative: 6 %
Neutro Abs: 2.5 10*3/uL (ref 1.7–7.7)
Neutrophils Relative %: 61 %
Platelets: 82 10*3/uL — ABNORMAL LOW (ref 150–400)
RBC: 4.11 MIL/uL (ref 3.87–5.11)
RDW: 14 % (ref 11.5–15.5)
WBC: 4 10*3/uL (ref 4.0–10.5)
nRBC: 0 % (ref 0.0–0.2)

## 2020-05-22 LAB — IRON AND TIBC
Iron: 77 ug/dL (ref 41–142)
Saturation Ratios: 23 % (ref 21–57)
TIBC: 329 ug/dL (ref 236–444)
UIBC: 252 ug/dL (ref 120–384)

## 2020-05-22 LAB — COMPREHENSIVE METABOLIC PANEL
ALT: 29 U/L (ref 0–44)
AST: 25 U/L (ref 15–41)
Albumin: 3.9 g/dL (ref 3.5–5.0)
Alkaline Phosphatase: 123 U/L (ref 38–126)
Anion gap: 11 (ref 5–15)
BUN: 22 mg/dL (ref 8–23)
CO2: 24 mmol/L (ref 22–32)
Calcium: 8.8 mg/dL — ABNORMAL LOW (ref 8.9–10.3)
Chloride: 107 mmol/L (ref 98–111)
Creatinine, Ser: 0.9 mg/dL (ref 0.44–1.00)
GFR, Estimated: >60 mL/min (ref >60)
Glucose, Bld: 129 mg/dL — ABNORMAL HIGH (ref 70–99)
Potassium: 4.2 mmol/L (ref 3.5–5.1)
Sodium: 142 mmol/L (ref 135–145)
Total Bilirubin: 0.7 mg/dL (ref 0.3–1.2)
Total Protein: 7.6 g/dL (ref 6.5–8.1)

## 2020-05-22 LAB — FERRITIN: Ferritin: 143 ng/mL (ref 11–307)

## 2020-05-22 MED ORDER — IOHEXOL 9 MG/ML PO SOLN
1000.0000 mL | ORAL | Status: AC
  Administered 2020-05-22: 1000 mL via ORAL

## 2020-05-22 MED ORDER — HEPARIN SOD (PORK) LOCK FLUSH 100 UNIT/ML IV SOLN
INTRAVENOUS | Status: AC
  Filled 2020-05-22: qty 5

## 2020-05-22 MED ORDER — IOHEXOL 300 MG/ML  SOLN
100.0000 mL | Freq: Once | INTRAMUSCULAR | Status: AC | PRN
  Administered 2020-05-22: 100 mL via INTRAVENOUS

## 2020-05-22 MED ORDER — IOHEXOL 9 MG/ML PO SOLN
ORAL | Status: AC
  Filled 2020-05-22: qty 1000

## 2020-05-22 MED ORDER — HEPARIN SOD (PORK) LOCK FLUSH 100 UNIT/ML IV SOLN
500.0000 [IU] | Freq: Once | INTRAVENOUS | Status: AC
  Administered 2020-05-22: 500 [IU] via INTRAVENOUS

## 2020-05-23 ENCOUNTER — Inpatient Hospital Stay (HOSPITAL_BASED_OUTPATIENT_CLINIC_OR_DEPARTMENT_OTHER): Payer: Medicare Other | Admitting: Hematology and Oncology

## 2020-05-23 ENCOUNTER — Encounter: Payer: Self-pay | Admitting: Hematology and Oncology

## 2020-05-23 ENCOUNTER — Encounter: Payer: Self-pay | Admitting: Oncology

## 2020-05-23 DIAGNOSIS — M549 Dorsalgia, unspecified: Secondary | ICD-10-CM | POA: Diagnosis not present

## 2020-05-23 DIAGNOSIS — K3189 Other diseases of stomach and duodenum: Secondary | ICD-10-CM | POA: Diagnosis not present

## 2020-05-23 DIAGNOSIS — G8929 Other chronic pain: Secondary | ICD-10-CM | POA: Diagnosis not present

## 2020-05-23 DIAGNOSIS — D696 Thrombocytopenia, unspecified: Secondary | ICD-10-CM

## 2020-05-23 DIAGNOSIS — D509 Iron deficiency anemia, unspecified: Secondary | ICD-10-CM | POA: Diagnosis not present

## 2020-05-23 DIAGNOSIS — K766 Portal hypertension: Secondary | ICD-10-CM | POA: Diagnosis not present

## 2020-05-23 DIAGNOSIS — Z794 Long term (current) use of insulin: Secondary | ICD-10-CM

## 2020-05-23 DIAGNOSIS — C482 Malignant neoplasm of peritoneum, unspecified: Secondary | ICD-10-CM | POA: Diagnosis present

## 2020-05-23 DIAGNOSIS — E1140 Type 2 diabetes mellitus with diabetic neuropathy, unspecified: Secondary | ICD-10-CM

## 2020-05-23 DIAGNOSIS — Z9221 Personal history of antineoplastic chemotherapy: Secondary | ICD-10-CM | POA: Diagnosis not present

## 2020-05-23 LAB — CA 125: Cancer Antigen (CA) 125: 36.9 U/mL (ref 0.0–38.1)

## 2020-05-23 MED ORDER — HYDROCODONE-ACETAMINOPHEN 5-325 MG PO TABS: 1.0000 | ORAL_TABLET | Freq: Four times a day (QID) | ORAL | 0 refills | Status: DC | PRN

## 2020-05-23 NOTE — Progress Notes (Signed)
[place] OFFICE PROGRESS NOTE  Patient Care Team: Gregor Hams, FNP as PCP - General (Family Medicine) Tat, Eustace Quail, DO as Consulting Physician (Neurology)  ASSESSMENT & PLAN:  Primary peritoneal carcinomatosis [facility]) I have reviewed her blood work and imaging studies with the patient She has enlarging pelvic lymphadenopathy She is not symptomatic She has no recent clinical skin infection or other causes that could explain the enlarging pelvic lymphadenopathy Her tumor marker is not helpful as they are within normal range The proximity of the enlarged lymph node to the blood vessel makes biopsy challenging and high risk I am somewhat reluctant to prescribe palliative chemotherapy with low burden of disease and when is isolated to one single lymph node I recommend consideration for palliative radiation therapy I will refer her to radiation oncology department for an opinion to see whether it is feasible or appropriate to offer palliative radiation therapy   Iron deficiency anemia She is scheduled for GI procedure She had history of GI hemorrhage in the past She is not anemic Her iron studies are adequate She will continue proton pump inhibitor  Thrombocytopenia ([place]) 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 It is stable She has no recent bleeding Observe closely for now  Hypocalcemia She has mild hypocalcemia I recommend her to increase vitamin D supplement  Chronic back pain greater than 3 months duration She has chronic back pain for many years Her last bone density scan from 2 years ago show mild osteopenia On her CT imaging, she has some deformity on the L3 area and this corresponds to the area of significant pain She is not able to lose any weight over the years that I have known her She is dependent on chronic narcotic prescriptions for this which I am somewhat reluctant to continue I recommend she contact her  orthopedic surgeon for evaluation and if he is not able to help, I will refer her to neuro spine surgeon for evaluation and management  Diabetes mellitus with diabetic neuropathy, with long-term current use of insulin ([place]) The dosage of her insulin was recently increased She has chronic persistent peripheral neuropathy likely secondary to diabetes I would defer to her primary care doctor for management   Orders Placed This Encounter  Procedures  . Ambulatory referral to Radiation Oncology    Referral Priority:   Routine    Referral Type:   Consultation    Referral Reason:   Specialty Services Required    Requested Specialty:   Radiation Oncology    Number of Visits Requested:   1    All questions were answered. The patient knows to call the clinic with any problems, questions or concerns. The total time spent in the appointment was 40 minutes encounter with patients including review of chart and various tests results, discussions about plan of care and coordination of care plan   [name], MD 05/23/2020 8:34 AM  INTERVAL HISTORY: Please see below for problem oriented charting. 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 Had allergic reaction to carboplatin. Avastin was stopped due to GI hemorrhage   Primary peritoneal carcinomatosis ([place])  09/06/2011 Imaging   CT findings consistent with cirrhotic  changes involving the liver.  No worrisome liver mass.  There are associated portal venous collaterals and splenomegaly consistent with portal venous hypertension. 2.  No other significant upper abdominal findings   11/08/2013 Imaging   US showed ascites   11/08/2013 Initial Diagnosis   Patient had ~ 2 months of some urinary incontinence, saw Dr Ronita Hipps in 10-2013, with pelvic mass on exam    11/12/2013 Imaging   There are findings of progressive hepatic cirrhosis with a large amount of ascites and mild splenomegaly. There are venous collaterals present. 2. There is abnormal thickening of the peritoneal surface along the left mid and lower abdominal wall. There is abnormal soft tissue density in the pelvis as well which could reflect the clinically suspected ovarian malignancy but the findings are nondiagnostic. 3. There is a new small left pleural effusion. 4. There is no acute bowel abnormality.   11/18/2013 Imaging   Successful ultrasound guided diagnostic and therapeutic paracentesis yielding 4.1 liters of ascites.    11/18/2013 Pathology Results   PERITONEAL/ASCITIC FLUID (SPECIMEN 1 OF 1 COLLECTED 11/18/13): SEROUS CARCINOMA, PLEASE SEE COMMENT.   12/01/2013 Tumor Marker   Patient's tumor was tested for the following markers: CA125 Results of the tumor marker test revealed 1938   12/03/2013 Imaging   Successful ultrasound-guided therapeutic paracentesis yielding 3.6 liters of peritoneal fluid.   12/07/2013 - 05/30/2014 Chemotherapy   She received 3 cycles of carboplatin and Taxol, interrupted cycle 4 for surgery.  She subsequently completed 3 more cycles of chemotherapy after surgery   12/20/2013 Imaging   Successful ultrasound-guided diagnostic and therapeutic paracentesis yielding 2.2 liters of peritoneal fluid   12/20/2013 Pathology Results   PERITONEAL/ASCITIC FLUID(SPECIMEN 1 OF 1 COLLECTED 12/20/13): MALIGNANT CELLS CONSISTENT WITH SEROUS CARCINOMA   01/13/2014 Tumor Marker    Patient's tumor was tested for the following markers: CA125 Results of the tumor marker test revealed 227   02/07/2014 Tumor Marker   Patient's tumor was tested for the following markers: CA125 Results of the tumor marker test revealed 130   02/15/2014 Genetic Testing   Genetics testing from 02-2014 normal ([employer]va[employer] panel)   02/23/2014 Imaging   Interval decrease in ascites. 2. Morphologic changes in the liver consistent with cirrhosis. 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 Portal vein remains patent at this time. 3. Persistent but decreased abnormal soft tissue attenuation tracking in the omental fat. This may be secondary to interval improvement in metastatic disease. 4. Peritoneal thickening seen in the left abdomen and pelvis on the previous study has decreased and nearly resolved in the interval. This also suggests interval improvement in metastatic disease.    03/07/2014 Procedure   Ultrasound and fluoroscopically guided right internal jugular single lumen power port catheter insertion. Tip in the SVC/RA junction. Catheter ready for use.   03/17/2014 Tumor Marker   Patient's tumor was tested for the following markers: CA125 Results of the tumor marker test revealed 45   03/29/2014 Pathology Results   1. Omentum, resection for tumor - HIGH GRADE SEROUS CARCINOMA, SEE COMMENT. 2. Ovary and fallopian tube, right - HIGH GRADE SEROUS CARCINOMA, SEE COMMENT. 3. Ovary and fallopian tube, left - HIGH GRADE SEROUS CARCINOMA, SEE COMMENT. Diagnosis Note 1. Nests  and clusters of malignant cells are invading the omental tissue (part #1) with associated fibrosis. The cells are pleomorphic with prominent nucleoli. There are scattered psammoma bodies. The ovaries are atrophic and exhibit multiple foci of surface based invasive carcinoma and associated fibrosis. The fallopian tubes have a few foci of carcinoma, also superficially located. There are no precursor  lesions noted in the ovaries or fallopian tubes (the entire tubes and ovaries were submitted for evaluation). While there is some retraction artifact, there are several foci suspicious for lymphovascular invasion. 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 Given the fibrosis, there does appear to be a small amount of treatment effect, however, there is abundant residual tumor.    03/29/2014 Surgery   Procedure(s) Performed: 1. Exploratory laparotomy with bilateral salpingo-oophorectomy, omentectomy radical tumor debulking for ovarian cancer .  Surgeon: Thereasa Solo, MD.  Assistant Surgeon: Lahoma Crocker, M.D. Assistant: (an MD assistant was necessary for tissue manipulation, retraction and positioning due to the complexity of the case and hospital policies).  Operative Findings: 10cm omental cake from hepatic to splenic flexure, densely adherent to transverse colon. Milliary studding of tumor implants (<72m, too numerous in number to count) adherent to the mesentery of the small bowel and small bowel wall. Small volume (100cc) ascites. Small ovaries bilaterally, densely adherent to the pelvic cul de sac. Left ovary and tube densely adherent to sigmoid colon. Cirrhotic liver with hepatomegaly and splenomegaly.     This represented an optimal cytoreduction (R1) with visible disease residual on the bowel wall and mesentery (millial, <32mimplants).     04/18/2014 Tumor Marker   Patient's tumor was tested for the following markers: CA125 Results of the tumor marker test revealed 122   05/16/2014 Tumor Marker   Patient's tumor was tested for the following markers: CA125 Results of the tumor marker test revealed 30   06/10/2014 Tumor Marker   Patient's tumor was tested for the following markers: CA125 Results of the tumor marker test revealed 19   07/04/2014  Imaging   Interval improvement in the appearance of peritoneal metastasis secondary to ovarian cancer. 2. 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    08/18/2014 Tumor Marker   Patient's tumor was tested for the following markers: CA125 Results of the tumor marker test revealed 16   10/13/2014 Tumor Marker   Patient's tumor was tested for the following markers: CA125 Results of the tumor marker test revealed 14   11/21/2014 Tumor Marker   Patient's tumor was tested for the following markers: CA125 Results of the tumor marker test revealed 16   12/28/2014 Tumor Marker   Patient's tumor was tested for the following markers: CA125 Results of the tumor marker test revealed 762   02/27/2015 Tumor Marker   Patient's tumor was tested for the following markers: CA125 Results of the tumor marker test revealed 20.2   03/22/2015 Imaging   Filler appearance to the prior exam, with very subtle fluid tracking along portions of the liver, and very minimal nodularity along the right paracolic gutter representing residua from the prior peritoneal cancer. The current abnormalities could simply be post therapy findings rather than necessarily representing residual malignancy. No new or enlarging lesions are identified. 2. Hepatic cirrhosis and splenomegaly. There is some gastric varices suggesting portal venous hypertension. 3. Left foraminal stenosis at L4-5 due to spurring. There is likely also  some central narrowing of the thecal sac at this level. 4. Bibasilar scarring. 5. Chronic mild left mid kidney scarring   03/27/2015 Tumor Marker   Patient's tumor was tested for the following markers: CA125 Results of the tumor marker test revealed 25.3   04/24/2015 Imaging   Disc bulge L2-3 with mild spinal stenosis and right foraminal encroachment. 2. Disc bulge L3-4 with mild spinal stenosis. 3. Multifactorial spinal, left lateral recess and foraminal stenosis L4-5. There is grade 1  anterolisthesis without evident dynamic instability.   05/29/2015 Tumor Marker   Patient's tumor was tested for the following markers: CA125 Results of the tumor marker test revealed 29.9   08/21/2015 Tumor Marker   Patient's tumor was tested for the following markers: CA125 Results of the tumor marker test revealed 45   09/18/2015 Tumor Marker   Patient's tumor was tested for the following markers: CA125 Results of the tumor marker test revealed 47   09/27/2015 Imaging   New small bowel mesenteric nodule and enlarged left external iliac lymph node, highly worrisome for metastatic disease. 2. Cirrhosis and splenomegaly   10/10/2015 Tumor Marker   Patient's tumor was tested for the following markers: CA125 Results of the tumor marker test revealed 53.4   10/10/2015 - 12/11/2015 Chemotherapy   She received 4 cycles of carboplatin only   11/20/2015 Tumor Marker   Patient's tumor was tested for the following markers: CA125 Results of the tumor marker test revealed 41.3   12/20/2015 Imaging   CT: Mild left external iliac lymphadenopathy is slightly decreased. Mildly enlarged lower mesenteric nodule is slightly decreased. 2. No new or progressive metastatic disease in the abdomen or pelvis. 3. Cirrhosis. Stable subcentimeter hypodense left liver lobe lesion. No new liver lesions. 4. Stable mild splenomegaly.  No ascites. 5. Mild sigmoid diverticulosis.   01/01/2016 -  Anti-estrogen oral therapy   She was placed on tamoxifen. Had letrozole initially but switched to tamoxifen due to poor tolerance   01/15/2016 Tumor Marker   Patient's tumor was tested for the following markers: CA125 Results of the tumor marker test revealed 31.4   02/19/2016 Tumor Marker   Patient's tumor was tested for the following markers: CA125 Results of the tumor marker test revealed 36.5   05/13/2016 Imaging   No evidence of disease progression within the abdomen or pelvis. Previously noted small central mesenteric  nodule and left external iliac node appear slightly smaller. 2. Stable changes of hepatic cirrhosis and portal hypertension with associated splenomegaly. No new or enlarging hepatic lesions are identified. 3. No acute findings.   05/13/2016 Tumor Marker   Patient's tumor was tested for the following markers: CA125 Results of the tumor marker test revealed 41.2   06/24/2016 Tumor Marker   Patient's tumor was tested for the following markers: CA125 Results of the tumor marker test revealed 47.3   08/20/2016 Imaging   1. The left external iliac lymph node is slightly increased in size compared to the prior exam, currently 1.1 cm and previously 0.9 cm in diameter. 2. Stable central mesenteric lymph node at 0.9 cm in diameter. 3. Stable trace free pelvic fluid and stable slight thickening along the right paracolic gutter, without well-defined peritoneal nodularity. 4. Other imaging findings of potential clinical significance: Subsegmental atelectasis or scarring in the lung bases. Hepatic cirrhosis. Left renal scarring. Mild splenomegaly. Sigmoid colon diverticulosis. Impingement at L4-5 due to spondylosis and degenerative disc disease broad Schmorl' s nodes at L3-L4. Pelvic floor laxity   08/23/2016 Tumor Marker  Patient's tumor was tested for the following markers: CA125 Results of the tumor marker test revealed 80.2   09/04/2016 Imaging   LV EF: 60% -   65%   09/12/2016 Tumor Marker   Patient's tumor was tested for the following markers: CA125 Results of the tumor marker test revealed 98.5   09/12/2016 - 01/29/2018 Chemotherapy   The patient received Doxil and Avastin. Avastin was discontinued in Dec 2019 due to GI hemorrhage   09/26/2016 Tumor Marker   Patient's tumor was tested for the following markers: CA125 Results of the tumor marker test revealed 68.9   10/10/2016 Tumor Marker   Patient's tumor was tested for the following markers: CA125 Results of the tumor marker test revealed 65.6    11/07/2016 Tumor Marker   Patient's tumor was tested for the following markers: CA125 Results of the tumor marker test revealed 46.4   11/29/2016 Imaging   Stable mild peritoneal thickening, suspicious for peritoneal carcinomatosis. No new or progressive disease identified. No evidence of ascites.  No significant change in 10 mm left external iliac and 8 mm mesenteric lymph nodes.  Stable hepatic cirrhosis, and splenomegaly consistent with portal venous hypertension.  Colonic diverticulosis, without radiographic evidence of diverticulitis.   12/02/2016 Imaging   Normal LV size with EF 55%. Basal inferior and basal inferoseptal hypokinesis. Normal RV size and systolic function. No significant valvular abnormalities.   12/05/2016 Tumor Marker   Patient's tumor was tested for the following markers: CA125 Results of the tumor marker test revealed 49.1   01/09/2017 Tumor Marker   Patient's tumor was tested for the following markers: CA125 Results of the tumor marker test revealed 47.2   02/06/2017 Tumor Marker   Patient's tumor was tested for the following markers: CA125 Results of the tumor marker test revealed 44.1   02/18/2017 Imaging   Stable mild peritoneal thickening. No new or progressive disease identified within the abdomen or pelvis.  Stable tiny sub-cm left external iliac and mesenteric lymph nodes.  Stable hepatic cirrhosis, and splenomegaly consistent with portal venous hypertension. No evidence of hepatic neoplasm.  Colonic diverticulosis, without radiographic evidence of diverticulitis.   03/06/2017 Tumor Marker   Patient's tumor was tested for the following markers: CA125 Results of the tumor marker test revealed 50.4   03/17/2017 Imaging   - Left ventricle: The cavity size was normal. Wall thickness was normal. Systolic function was normal. The estimated ejection fraction was in the range of 55% to 60%. Wall motion was normal; there were no regional wall motion  abnormalities. Features are consistent with a pseudonormal left ventricular filling pattern, with concomitant abnormal relaxation and increased filling pressure (grade 2 diastolic dysfunction). - Mitral valve: There was mild regurgitation. - Left atrium: The atrium was mildly dilated   04/03/2017 Tumor Marker   Patient's tumor was tested for the following markers: CA125 Results of the tumor marker test revealed 47.2   05/14/2017 Imaging   CT scan of abdomen and pelvis Stable mild peritoneal thickening. No new or progressive disease within the abdomen or pelvis.  Stable hepatic cirrhosis. Stable splenomegaly, consistent with portal venous hypertension. No evidence of hepatic neoplasm.   05/15/2017 Tumor Marker   Patient's tumor was tested for the following markers: CA125 Results of the tumor marker test revealed 56.1   06/12/2017 Tumor Marker   Patient's tumor was tested for the following markers: CA125 Results of the tumor marker test revealed 49.2   07/10/2017 Tumor Marker   Patient's tumor was tested for the  following markers: CA125 Results of the tumor marker test revealed 46.3   08/07/2017 Tumor Marker   Patient's tumor was tested for the following markers: CA125 Results of the tumor marker test revealed 52.9   08/20/2017 Imaging   1. Mild omental/peritoneal haziness, unchanged. No evidence of new metastatic disease. 2. Cirrhosis with splenomegaly.   09/04/2017 Tumor Marker   Patient's tumor was tested for the following markers: CA125 Results of the tumor marker test revealed 48.5   11/13/2017 Tumor Marker   Patient's tumor was tested for the following markers: CA125 Results of the tumor marker test revealed 55.8   12/17/2017 Imaging   1. No findings to suggest metastatic disease in the abdomen or pelvis. 2. Severe hepatic cirrhosis with evidence of portal hypertension, as demonstrated by dilated portal vein, splenomegaly and portosystemic collateral pathways, including gastric and  esophageal varices.  3. Colonic diverticulosis without evidence of acute diverticulitis at this time. 4. Additional incidental findings, as above.   01/16/2018 Tumor Marker   Patient's tumor was tested for the following markers: CA125 Results of the tumor marker test revealed 63.7   02/10/2018 Procedure   EGD - Recently bleeding grade III and large (> 5 mm) esophageal varices. Completely eradicated. Banded. - Portal hypertensive gastropathy. - Normal examined duodenum. - No specimens collected   02/13/2018 Tumor Marker   Patient's tumor was tested for the following markers: CA125 Results of the tumor marker test revealed 82.7   02/25/2018 Imaging   Bone density showed mild osteopenia   04/08/2018 Tumor Marker   Patient's tumor was tested for the following markers: CA125 Results of the tumor marker test revealed 82.7   04/08/2018 Imaging   1. No definite omental or peritoneal surface lesions. However, there are 2 slowly enlarging lymph nodes noted. One is in the small bowel mesentery and the other is in the left deep pelvis. Could not exclude recurrent disease. 2. Stable advanced cirrhotic changes involving the liver with portal venous hypertension, portal venous collaterals, esophageal varices and splenomegaly. No worrisome hepatic lesions. 3. Diffuse wall thickening of the colon could suggest diffuse colitis or could be due to low albumin. Recommend correlation with any symptoms such as diarrhea.   05/21/2018 Tumor Marker   Patient's tumor was tested for the following markers: CA125 Results of the tumor marker test revealed 86   09/07/2018 Tumor Marker   Patient's tumor was tested for the following markers:CA-125 Results of the tumor marker test revealed 111   09/29/2018 Tumor Marker   Patient's tumor was tested for the following markers: CA-125 Results of the tumor marker test revealed 121.   09/29/2018 - 11/24/2018 Chemotherapy   The patient had carboplatin and gemzar for  chemotherapy treatment.  Chemo was stopped due to allergic reaction to carboplatin   10/27/2018 Tumor Marker   Patient's tumor was tested for the following markers: CA-125 Results of the tumor marker test revealed 99.9   11/24/2018 Tumor Marker   Patient's tumor was tested for the following markers: CA-125 Results of the tumor marker test revealed 81.2   12/14/2018 Tumor Marker   Patient's tumor was tested for the following markers: CA-125 Results of the tumor marker test revealed 81.2   12/14/2018 Imaging   1. Mild improvement in peritoneal disease. Peritoneal nodule within the left posterior pelvis contiguous with the left side of vaginal cuff is slightly decreased in size in the interval. Within the right iliac fossa there is a small peritoneal nodule which has decreased in size in the  interval. Central small bowel mesenteric lymph node is not significantly changed. Slight decrease in size left periaortic lymph node.    2. Morphologic features of the liver compatible with cirrhosis. Splenomegaly is noted which may reflect portal venous hypertension.   12/21/2018 Tumor Marker   Patient's tumor was tested for the following markers: CA-125 Results of the tumor marker test revealed 64.2   12/21/2018 -  Chemotherapy   The patient had taxotere for chemotherapy treatment.     03/02/2019 Tumor Marker   Patient's tumor was tested for the following markers: CA-125. 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   No new or progressive metastatic disease within the abdomen or pelvis.   Hepatic cirrhosis and findings of portal venous hypertension. 1.1 cm low-attenuation lesion within left hepatic lobe, which could represent a regenerative nodule or small hepatocellular carcinoma. Recommend abdomen MRI without and with contrast for further characterization.     04/13/2019 Tumor Marker   Patient's  tumor was tested for the following markers: CA-125 Results of the tumor marker test revealed 36.8   05/26/2019 Tumor Marker   Patient's tumor was tested for the following markers: CA-125 Results of the tumor marker test revealed 31.6.   06/15/2019 Tumor Marker   Patient's tumor was tested for the following markers: CA-125 Results of the tumor marker test revealed 25.4   07/05/2019 Imaging   1. Stable CT examination. Stable peritoneal thickening in the pelvis and paracolic gutters. Stable solid left deep pelvic implant. Stable trace ascites. No new or progressive metastatic disease. 2. Stable cirrhosis. No discrete liver masses. 3. Stable mild-to-moderate splenomegaly. 4. Aortic Atherosclerosis (ICD10-I70.0).   07/27/2019 Tumor Marker   Patient's tumor was tested for the following markers: CA-125 Results of the tumor marker test revealed 35.4   08/17/2019 Tumor Marker   Patient's tumor was tested for the following markers: Ca-125 Results of the tumor marker test revealed 35.7.   09/17/2019 - 09/21/2019 Hospital Admission   She was admitted due to esophageal variceal bleeding presents with hematemesis and melanotic stool with epigastric pain.  GI consulted, started on octreotide infusion.  Underwent EGD which showed minimal bleeding, remain on octreotide drip, PPI drip.  Hemoglobin trended down therefore received PRBC transfusion.  Over several days her bleeding subsided, hemodynamically remained stable.    09/17/2019 Procedure   EGD report - Esophageal mucosal scarring, from prior esophageal banding sessions. - Grade I esophageal varices. - Red blood in the cardia and in the gastric fundus. - Portal hypertensive gastropathy. - Normal duodenal bulb, first portion of the duodenum and second portion of the duodenum. - No obvious source of bleeding identified; no ongoing/active bleeding seen during our exam; possible explanations for bleeding include: variceal bleeding (gastric or esophageal) that  had resolved and varices flattened with medical therapy versus dieulafoy lesion versus portal gastropathy bleeding versus other   11/05/2019 Imaging   1. 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 2. Otherwise stable CT exam. Stable peritoneal thickening within the bilateral deep pelvic peritoneum. Stable left deep pelvic implant.No evidence for progressed disease. 3. Stable splenomegaly. 4. Morphologic changes to the liver compatible with cirrhosis. Trace perihepatic and pelvic ascites.   02/22/2020 Imaging   1. New lucent lesion in the left side of the L4 vertebral body measuring 1 cm, concerning for possible osseous metastasis. Consideration for further evaluation with bone scan is  suggested. Close attention on follow-up imaging is also recommended to ensure stability. 2. Previously noted peritoneal thickening and nodularity in the low anatomic pelvis has regressed slightly. 3. Borderline enlarged and mildly enlarged pelvic and retroperitoneal lymph nodes appear similar to slightly increased compared to the prior examination, as above. Continued attention on follow-up studies is recommended.  4. 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 5. Aortic atherosclerosis. 6. Additional incidental findings, as above.   02/22/2020 Tumor Marker   Patient's tumor was tested for the following markers: CA-125 Results of the tumor marker test revealed 30.1   04/06/2020 Tumor Marker   Patient's tumor was tested for the following markers: CA-125 Results of the tumor marker test revealed 35.7     REVIEW OF SYSTEMS:   Constitutional: Denies fevers, chills or abnormal weight loss Eyes: Denies blurriness of vision Ears, nose, mouth, throat, and face: Denies mucositis or sore throat Respiratory: Denies cough, dyspnea or wheezes Cardiovascular: Denies palpitation, chest discomfort  or lower extremity swelling Gastrointestinal:  Denies nausea, heartburn or change in bowel habits Skin: Denies abnormal skin rashes Lymphatics: Denies new lymphadenopathy or easy bruising Neurological:Denies numbness, tingling or new weaknesses Behavioral/Psych: Mood is stable, no new changes  All other systems were reviewed with the patient and are negative.  I have reviewed the past medical history, past surgical history, social history and family history with the patient and they are unchanged from previous note.  ALLERGIES:  is allergic to carboplatin, shellfish allergy, vancomycin, benadryl [diphenhydramine hcl], and penicillins.  MEDICATIONS:  Current Outpatient Medications  Medication Sig Dispense Refill  . calcium carbonate (TUMS - DOSED IN MG ELEMENTAL CALCIUM) 500 MG chewable tablet Chew 1 tablet by mouth 2 (two) times daily.    . cholecalciferol (VITAMIN D) 1000 units tablet Take 1,000 Units by mouth at bedtime.     Marland Kitchen dexamethasone (DECADRON) 4 MG tablet Start the day before Taxotere, take 1 tablet in the morning, and then 1 tablet daily after chemo for 2 days. Pls dispense 30 tabs (Patient taking differently: Take 4 mg by mouth See admin instructions. Start the day before Taxotere, take 1 tablet in the morning, and then 1 tablet daily after chemo for 2 days. Pls dispense 30 tabs) 30 tablet 1  . gabapentin (NEURONTIN) 300 MG capsule TAKE 2 CAPSULES(600 MG) BY MOUTH TWICE DAILY 120 capsule 11  . HUMALOG MIX 75/25 KWIKPEN (75-25) 100 UNIT/ML Kwikpen Inject 25 Units into the skin 2 (two) times daily. Before breakfast & before supper Except when taking Chemo take 32 units  For three to five days    . HYDROcodone-acetaminophen (NORCO/VICODIN) 5-325 MG tablet Take 1 tablet by mouth every 6 (six) hours as needed for moderate pain. 60 tablet 0  . lidocaine-prilocaine (EMLA) cream Apply to Porta-cath  1-2 hours prior to access as directed. (Patient taking differently: Apply 1 application  topically daily as needed (port access). ) 30 g 9  . nadolol (CORGARD) 20 MG tablet Take 1 tablet (20 mg total) by mouth daily. (Patient taking differently: Take 20 mg by mouth at bedtime. ) 90 tablet 3  . ondansetron (ZOFRAN) 8 MG tablet Take 1 tablet (8 mg total) by mouth every 8 (eight) hours as needed. 60 tablet 1  . pantoprazole (PROTONIX) 40 MG tablet Take 1 tablet (40 mg total) by mouth daily.    . prochlorperazine (COMPAZINE) 10 MG tablet Take 1 tablet (10 mg total) by mouth every 6 (six) hours as  needed (Nausea or vomiting). 30 tablet 1  . rotigotine (NEUPRO) 4 MG/24HR APPLY 1 PATCH EXTERNALLY TO THE SKIN DAILY 30 patch 6   No current facility-administered medications for this visit.    PHYSICAL EXAMINATION: ECOG PERFORMANCE STATUS: 1 - Symptomatic but completely ambulatory  Vitals:   05/23/20 0756  BP: 125/60  Pulse: 72  Resp: 18  Temp: 98.1 F (36.7 C)  SpO2: 98%   Filed Weights   05/23/20 0756  Weight: 195 lb 9.6 oz (88.7 kg)    GENERAL:alert, no distress and comfortable Musculoskeletal:no cyanosis of digits and no clubbing  NEURO: alert & oriented x 3 with fluent speech, no focal motor/sensory deficits  LABORATORY DATA:  I have reviewed the data as listed    Component Value Date/Time   NA 142 05/22/2020 0748   NA 142 02/06/2017 0742   K 4.2 05/22/2020 0748   K 3.9 02/06/2017 0742   CL 107 05/22/2020 0748   CO2 24 05/22/2020 0748   CO2 22 02/06/2017 0742   GLUCOSE 129 (H) 05/22/2020 0748   GLUCOSE 156 (H) 02/06/2017 0742   BUN 22 05/22/2020 0748   BUN 13.5 02/06/2017 0742   CREATININE 0.90 05/22/2020 0748   CREATININE 0.85 10/26/2019 0722   CREATININE 0.8 02/06/2017 0742   CALCIUM 8.8 (L) 05/22/2020 0748   CALCIUM 8.5 02/06/2017 0742   PROT 7.6 05/22/2020 0748   PROT 6.9 02/06/2017 0742   ALBUMIN 3.9 05/22/2020 0748   ALBUMIN 3.7 02/06/2017 0742   AST 25 05/22/2020 0748   AST 24 10/26/2019 0722   AST 47 (H) 02/06/2017 0742   ALT 29 05/22/2020 0748    ALT 17 10/26/2019 0722   ALT 54 02/06/2017 0742   ALKPHOS 123 05/22/2020 0748   ALKPHOS 130 02/06/2017 0742   BILITOT 0.7 05/22/2020 0748   BILITOT 0.7 10/26/2019 0722   BILITOT 0.65 02/06/2017 0742   GFRNONAA >60 05/22/2020 0748   GFRNONAA >60 10/26/2019 0722   GFRAA >60 11/09/2019 0953   GFRAA >60 10/26/2019 0722    No results found for: SPEP, UPEP  Lab Results  Component Value Date   WBC 4.0 05/22/2020   NEUTROABS 2.5 05/22/2020   HGB 12.5 05/22/2020   HCT 37.8 05/22/2020   MCV 92.0 05/22/2020   PLT 82 (L) 05/22/2020      Chemistry      Component Value Date/Time   NA 142 05/22/2020 0748   NA 142 02/06/2017 0742   K 4.2 05/22/2020 0748   K 3.9 02/06/2017 0742   CL 107 05/22/2020 0748   CO2 24 05/22/2020 0748   CO2 22 02/06/2017 0742   BUN 22 05/22/2020 0748   BUN 13.5 02/06/2017 0742   CREATININE 0.90 05/22/2020 0748   CREATININE 0.85 10/26/2019 0722   CREATININE 0.8 02/06/2017 0742      Component Value Date/Time   CALCIUM 8.8 (L) 05/22/2020 0748   CALCIUM 8.5 02/06/2017 0742   ALKPHOS 123 05/22/2020 0748   ALKPHOS 130 02/06/2017 0742   AST 25 05/22/2020 0748   AST 24 10/26/2019 0722   AST 47 (H) 02/06/2017 0742   ALT 29 05/22/2020 0748   ALT 17 10/26/2019 0722   ALT 54 02/06/2017 0742   BILITOT 0.7 05/22/2020 0748   BILITOT 0.7 10/26/2019 0722   BILITOT 0.65 02/06/2017 0742       RADIOGRAPHIC STUDIES: I have reviewed CT imaging with the patient I have personally reviewed the radiological images as listed and agreed with the findings in  the report. CT ABDOMEN PELVIS W CONTRAST  Result Date: 05/22/2020 CLINICAL DATA:  Ovarian cancer restaging EXAM: CT ABDOMEN AND PELVIS WITH CONTRAST TECHNIQUE: Multidetector CT imaging of the abdomen and pelvis was performed using the standard protocol following bolus administration of intravenous contrast. CONTRAST:  16m OMNIPAQUE IOHEXOL 300 MG/ML SOLN, additional oral enteric contrast COMPARISON:  02/22/2020  FINDINGS: Lower chest: No acute abnormality. Bandlike scarring and or atelectasis of the included bilateral lung bases, unchanged. Bilateral breast implants. Hepatobiliary: Nodular, cirrhotic morphology of the liver. No solid liver abnormality is seen. Tiny gallstone in the gallbladder. No gallbladder wall thickening, or biliary dilatation. Pancreas: Unremarkable. No pancreatic ductal dilatation or surrounding inflammatory changes. Spleen: Splenomegaly, maximum coronal span 15.7 cm. Adrenals/Urinary Tract: Adrenal glands are unremarkable. Kidneys are normal, without renal calculi, solid lesion, or hydronephrosis. Bladder is unremarkable. Stomach/Bowel: Stomach is within normal limits. Appendix appears normal. No evidence of bowel wall thickening, distention, or inflammatory changes. Sigmoid diverticula. Status post omentectomy. Vascular/Lymphatic: Scattered aortic atherosclerosis. Interval enlargement of a left iliac lymph node, measuring 2.0 x 1.4 cm, previously 1.6 x 1.2 cm (series 2, image 76). Otherwise unchanged prominent subcentimeter iliac and retroperitoneal lymph nodes (series 2, image 40, 53). Reproductive: Status post hysterectomy. Other: No abdominal wall hernia or abnormality. No abdominopelvic ascites. There is minimal residual peritoneal thickening and nodularity, unchanged (series 2, image 51, 75). Musculoskeletal: There is a redemonstrated superior endplate deformity of L3. There is a redemonstrated Schmorl morphology lesion of the superior endplate of L4 and the abutting inferior endplate of L3, with new adjacent sclerosis, unchanged in size at approximately 0.9 cm in diameter (series 5, image 58). IMPRESSION: 1. Interval enlargement of a left iliac lymph node, measuring 2.0 x 1.4 cm, previously 1.6 x 1.2 cm, consistent with worsening nodal metastatic disease. Otherwise unchanged prominent subcentimeter iliac and retroperitoneal lymph nodes. 2. There is minimal residual peritoneal thickening and  nodularity about the abdomen and pelvis, unchanged, consistent with treated peritoneal metastatic disease. 3. There is a redemonstrated Schmorl morphology lesion of the superior endplate of L4 and the abutting inferior endplate of L3, with new adjacent sclerosis, unchanged in size at approximately 0.9 cm in diameter. 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 Continued attention on follow-up. 4. Status post hysterectomy and omentectomy. 5. Cirrhosis and stigmata of portal hypertension including splenomegaly. 6. Cholelithiasis. Aortic Atherosclerosis (ICD10-I70.0). Electronically Signed   By: AEddie CandleM.D.   On: 05/22/2020 15:59

## 2020-05-23 NOTE — Progress Notes (Signed)
Called Olivia Benson and advised her of appointment to see Dr. Sondra Come on 06/07/20 (8:30 nurse eval, 9:00 consult).  She verbalized understanding and agreement.

## 2020-05-23 NOTE — Assessment & Plan Note (Signed)
I have reviewed her blood work and imaging studies with the patient She has enlarging pelvic lymphadenopathy She is not symptomatic She has no recent clinical skin infection or other causes that could explain the enlarging pelvic lymphadenopathy Her tumor marker is not helpful as they are within normal range The proximity of the enlarged lymph node to the blood vessel makes biopsy challenging and high risk I am somewhat reluctant to prescribe palliative chemotherapy with low burden of disease and when is isolated to one single lymph node I recommend consideration for palliative radiation therapy I will refer her to radiation oncology department for an opinion to see whether it is feasible or appropriate to offer palliative radiation therapy

## 2020-05-23 NOTE — Assessment & Plan Note (Addendum)
She has chronic back pain for many years Her last bone density scan from 2 years ago show mild osteopenia On her CT imaging, she has some deformity on the L3 area and this corresponds to the area of significant pain She is not able to lose any weight over the years that I have known her She is dependent on chronic narcotic prescriptions for this which I am somewhat reluctant to continue I recommend she contact her orthopedic surgeon for evaluation and if he is not able to help, I will refer her to neuro spine surgeon for evaluation and management

## 2020-05-23 NOTE — Assessment & Plan Note (Signed)
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 It is stable She has no recent bleeding Observe closely for now

## 2020-05-23 NOTE — Assessment & Plan Note (Signed)
She has mild hypocalcemia I recommend her to increase vitamin D supplement

## 2020-05-23 NOTE — Assessment & Plan Note (Signed)
She is scheduled for GI procedure She had history of GI hemorrhage in the past She is not anemic Her iron studies are adequate She will continue proton pump inhibitor

## 2020-05-23 NOTE — Assessment & Plan Note (Signed)
The dosage of her insulin was recently increased She has chronic persistent peripheral neuropathy likely secondary to diabetes I would defer to her primary care doctor for management

## 2020-05-31 NOTE — Progress Notes (Signed)
Location of Tumor / Histology:     Biopsies    Past/Anticipated interventions by surgeon, if any:    Past/Anticipated interventions by medical oncology, if any:        Weight changes, if any: yes, slow, steady weight gain   Bowel/Bladder complaints, if any: denies diarrhea, constipation, nausea or vomiting. Reports frequency, urgency, inability to empty bladder completely. Denies dysuria. Denies bleeding per rectum or hematuria.  Nausea / Vomiting, if any: no  Pain issues, if any:  yes, 6/10 lower back pain  Any blood per rectum:   no  SAFETY ISSUES:  Prior radiation? 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 no  Possible current pregnancy? no  Is the patient on methotrexate? no  Current Complaints/Details:  None   Vitals:   06/07/20 0901  BP: 122/60  Pulse: (!) 56  Resp: 20  Temp: 97.9 F (36.6 C)  SpO2: 100%  Weight: 196 lb 6.4 oz (89.1 kg)  Height: 5' 1"  (1.549 m)

## 2020-06-07 ENCOUNTER — Ambulatory Visit
Admission: RE | Admit: 2020-06-07 | Discharge: 2020-06-07 | Disposition: A | Discharge status: Home / Self Care | Payer: Medicare Other | Source: Ambulatory Visit | Attending: Radiation Oncology | Admitting: Radiation Oncology

## 2020-06-07 ENCOUNTER — Encounter: Payer: Self-pay | Admitting: Radiation Oncology

## 2020-06-07 ENCOUNTER — Other Ambulatory Visit: Payer: Self-pay

## 2020-06-07 VITALS — BP 122/60 | HR 56 | Temp 97.9°F | Resp 20 | Ht 61.0 in | Wt 196.4 lb

## 2020-06-07 DIAGNOSIS — C786 Secondary malignant neoplasm of retroperitoneum and peritoneum: Secondary | ICD-10-CM

## 2020-06-07 DIAGNOSIS — I509 Heart failure, unspecified: Secondary | ICD-10-CM | POA: Diagnosis not present

## 2020-06-07 DIAGNOSIS — C482 Malignant neoplasm of peritoneum, unspecified: Secondary | ICD-10-CM | POA: Insufficient documentation

## 2020-06-07 DIAGNOSIS — Z87891 Personal history of nicotine dependence: Secondary | ICD-10-CM | POA: Insufficient documentation

## 2020-06-07 DIAGNOSIS — Z803 Family history of malignant neoplasm of breast: Secondary | ICD-10-CM | POA: Insufficient documentation

## 2020-06-07 DIAGNOSIS — Z79899 Other long term (current) drug therapy: Secondary | ICD-10-CM | POA: Insufficient documentation

## 2020-06-07 DIAGNOSIS — Z8052 Family history of malignant neoplasm of bladder: Secondary | ICD-10-CM | POA: Diagnosis not present

## 2020-06-07 DIAGNOSIS — E119 Type 2 diabetes mellitus without complications: Secondary | ICD-10-CM | POA: Insufficient documentation

## 2020-06-07 DIAGNOSIS — Z923 Personal history of irradiation: Secondary | ICD-10-CM | POA: Insufficient documentation

## 2020-06-07 DIAGNOSIS — Z9221 Personal history of antineoplastic chemotherapy: Secondary | ICD-10-CM | POA: Diagnosis not present

## 2020-06-07 DIAGNOSIS — R948 Abnormal results of function studies of other organs and systems: Secondary | ICD-10-CM | POA: Diagnosis not present

## 2020-06-07 DIAGNOSIS — I252 Old myocardial infarction: Secondary | ICD-10-CM | POA: Insufficient documentation

## 2020-06-07 DIAGNOSIS — C569 Malignant neoplasm of unspecified ovary: Secondary | ICD-10-CM

## 2020-06-07 DIAGNOSIS — I10 Essential (primary) hypertension: Secondary | ICD-10-CM | POA: Diagnosis not present

## 2020-06-07 NOTE — Progress Notes (Signed)
See MD note regarding nursing evaluation.

## 2020-06-07 NOTE — Progress Notes (Signed)
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 Diagnoses of Disseminated malignant neoplasm of ovary, unspecified laterality ([place]) and Peritoneal carcinomatosis ([place]) were also pertinent to this visit.  Primary peritoneal carcinomatosis  HISTORY OF PRESENT ILLNESS::Olivia Benson is a 75 y.o. female who is seen as a courtesy of Dr. Alvy Bimler for an opinion concerning radiation therapy as part of management for her metastatic ovarian cancer. Today, she is accompanied by her husband. 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   She was underwent a CT scan on 11/12/2013 for evaluation of urinary incontinence and pelvic mass. Results showed abnormal thickening of the peritoneal surface along the left mid and lower abdominal wall with abnormal tissue density in the pelvis. The patient then underwent an ultrasound-guided diagnostic and therapeutic paracentesis for ascites that revealed serous carcinoma.  Given the above findings, the patient underwent chemotherapy with Carboplatin and Taxol from 12/07/2013 to 05/30/2014 under the care of Dr. Alvy Bimler.  The patient underwent an exploratory laparotomy with bilateral salpingo-oophorectomy on 03/29/2014 under the care of Dr. Denman George. Pathology from the procedure revealed high-grade serous carcinoma of the omentum, bilateral fallopian tubes, and bilateral ovaries.  CT scan on 07/04/2014 showed interval improvement in the appearance of peritoneal metastasis secondary to ovarian cancer.  The patient continued to get routine imaging and then underwent four cycles of Carboplatin from 10/10/2015 to 12/11/2015 secondary to new  small bowel mesenteric nodule and enlarged left external iliac lymph node seen on 09/27/2015 imaging. She was then started on anti-estrogen oral therapy with Tamoxifen on 01/01/2016.  Unfortunately, the patient was found to have a slightly enlarged left external iliac lymph node and then had to undergo additional chemotherapy with Doxil and Avastin from 09/12/2016 to 01/29/2018.  Routine imaging studies were performed that remained relatively stable until 2020 when she was found to have two slowly enlarging lymph nodes, one in the small bowel mesentery and the other in the left deep pelvis. Thus, she underwent chemotherapy with Carboplatin and Gemzar from 09/29/2018 to 11/24/2018. Chemotherapy was discontinued at that time secondary to allergic reaction to Carboplatin. She was then placed on Taxotere on 12/21/2018.   Imaging studies remained relatively stable until most recently, when CT scan of chest, abdomen, and pelvis on 02/22/2020 revealed a new lucent lesion in the left side of the L4 vertebral body that measured 1 cm and was concerning for possible osseous metastasis. Borderline enlarged and mildly enlarged pelvic and retroperitoneal lymph nodes appeared similar to slightly increased.  Most recent CT scan of abdomen and pelvis on 05/22/2020 showed interval enlargement of a left iliac lymph node that measured 2.0 x 1.4 cm and was consistent with worsening nodal metastatic disease. Otherwise, prominent sub-centimeter iliac and retroperitoneal lymph nodes were unchanged. There was also noted to be minimal residual peritoneal thickening and nodularity about the abdomen and pelvis, which was unchanged and consistent with treated peritoneal metastatic disease. Furthermore, the Schmorl morphology lesion of the superior endplate of L4 was re-demonstrated and abutted the inferior endplate of L3 with new adjacent sclerosis, unchanged in size at approximately 0.9 cm in diameter. Given appearance, it was favored  to reflect an incidental mechanical Schmorl deformity, however,  osseous metastatic disease was difficult to strictly exclude.  The patient was last seen by Dr. Lurlean Leyden on 05/23/2020, at which time it was recommended that she be considered for palliative radiation therapy to the enlarged lymph node, given the isolated area of involvement as well as the patient's poor tolerance to systemic chemotherapy.  PREVIOUS RADIATION THERAPY: No  PAST MEDICAL HISTORY:  Past Medical History:  Diagnosis Date  . Asthma   . CHF (congestive heart failure) ([place])   . Cirrhosis ([place])   . Complication of anesthesia    slow to wake up / n & v  . Diabetes mellitus   . Elevated LFTs   . Family history of adverse reaction to anesthesia    multiple family members have had problems such as slow to wake / "flat lined" /  ventilator    . Frequency of urination   . Glaucoma, narrow-angle   . History of skin cancer   . Hypertension   . Non-ST elevated myocardial infarction (non-STEMI) [facility]) Sept 2012   Mild elevation in troponin and CK MB but no evidence of coronary disease at time of cath; per patient " i had undiagnosed heart event but not a heart attack"   . Obesity   . Ovarian cancer ([place]) 11/22/2013   Disseminated ovarian cancer; 09-25-2018   "my cancer is back and im restarting chemo on tuesday 09-25-2018]  . Overactive bladder   . PONV (postoperative nausea and vomiting)    no problems with feb  egd   . Portal hypertension ([place])   . Restless leg syndrome   . Tingling    hands and feet    PAST SURGICAL HISTORY: Past Surgical History:  Procedure Laterality Date  . ABDOMINAL ULTRASOUND  Sept 2012   No gallbladder disease, + fatty liver  . CARDIAC CATHETERIZATION  10/25/2010   EF 60%; Normal coronaries  . COLONOSCOPY  10/18/2005  . COLONOSCOPY WITH PROPOFOL N/A 09/28/2018   Procedure: COLONOSCOPY WITH PROPOFOL;  Surgeon: Ronnette Juniper, MD;  Location: [facility];  Service: Gastroenterology;  Laterality: N/A;   . COSMETIC SURGERY  2003  . DENTAL SURGERY    . ESOPHAGEAL BANDING  02/10/2018   Procedure: ESOPHAGEAL BANDING;  Surgeon: Ronnette Juniper, MD;  Location: [facility];  Service: Gastroenterology;;  . ESOPHAGEAL BANDING N/A 03/23/2018   Procedure: ESOPHAGEAL BANDING;  Surgeon: Ronnette Juniper, MD;  [facility]: [facility];  Service: Gastroenterology;  Laterality: N/A;  . ESOPHAGEAL BANDING N/A 09/28/2018   Procedure: ESOPHAGEAL BANDING;  Surgeon: Ronnette Juniper, MD;  Location: [facility];  Service: Gastroenterology;  Laterality: N/A;  . ESOPHAGEAL BANDING  03/01/2019   Procedure: ESOPHAGEAL BANDING;  Surgeon: Ronnette Juniper, MD;  [facility];  Service: Gastroenterology;;  . ESOPHAGOGASTRODUODENOSCOPY N/A 03/23/2018   Procedure: ESOPHAGOGASTRODUODENOSCOPY (EGD);  Surgeon: Ronnette Juniper, MD;  Location: [facility];  Service: Gastroenterology;  Laterality: N/A;  . ESOPHAGOGASTRODUODENOSCOPY N/A 09/17/2019   Procedure: ESOPHAGOGASTRODUODENOSCOPY (EGD);  Surgeon: Arta Silence, MD;  Location: [facility];  Service: Endoscopy;  Laterality: N/A;  . ESOPHAGOGASTRODUODENOSCOPY (EGD) WITH PROPOFOL N/A 02/10/2018   Procedure: ESOPHAGOGASTRODUODENOSCOPY (EGD) WITH PROPOFOL;  Surgeon: Ronnette Juniper, MD;  [facility];  Service: Gastroenterology;  Laterality: N/A;  . ESOPHAGOGASTRODUODENOSCOPY (EGD) WITH PROPOFOL N/A 09/28/2018   Procedure: ESOPHAGOGASTRODUODENOSCOPY (EGD) WITH PROPOFOL;  Surgeon: Ronnette Juniper, MD;  [facility];  Service: Gastroenterology;  Laterality: N/A;  . ESOPHAGOGASTRODUODENOSCOPY (EGD) WITH PROPOFOL N/A 03/01/2019   Procedure: ESOPHAGOGASTRODUODENOSCOPY (EGD) WITH PROPOFOL with banding;  Surgeon: Ronnette Juniper, MD;  Location: [facility];  Service: Gastroenterology;  Laterality: N/A;  . ESOPHAGOGASTRODUODENOSCOPY (EGD) WITH PROPOFOL N/A 05/24/2019   Procedure: ESOPHAGOGASTRODUODENOSCOPY (EGD) WITH PROPOFOL;  Surgeon: Ronnette Juniper, MD;  [facility];  Service:  Gastroenterology;  Laterality: N/A;  . EYE SURGERY Bilateral 09/19/14 R eye, 08/2014 L eye   open angles in both eyes d/t narrow angle glaucoma  . GASTRIC VARICES BANDING N/A 05/24/2019   Procedure: GASTRIC VARICES BANDING;  Surgeon: Ronnette Juniper, MD;  Location: [facility];  Service: Gastroenterology;  Laterality: N/A;  . LAPAROTOMY N/A 03/29/2014   Procedure: EXPLORATORY LAPAROTOMY;  Surgeon: Everitt Amber, MD;  Location: [facility];  Service: Gynecology;  Laterality: N/A;  . ORIF ANKLE FRACTURE Right 03/09/2018   Procedure: OPEN REDUCTION INTERNAL FIXATION (ORIF) RIGHT LATERAL MALLEOLUS;  Surgeon: Leandrew Koyanagi, MD;  Location: [place];  Service: Orthopedics;  Laterality: Right;  . PARTIAL HYSTERECTOMY  1988  . POLYPECTOMY  09/28/2018   Procedure: POLYPECTOMY;  Surgeon: Ronnette Juniper, MD;  Location: [facility];  Service: Gastroenterology;;  . SALPINGOOPHORECTOMY Bilateral 03/29/2014   Procedure: BILATERAL SALPINGO OOPHORECTOMY, OMENTECTOMY, DEBULKING;  Surgeon: Everitt Amber, MD;  Location: [facility];  Service: Gynecology;  Laterality: Bilateral;  . TONSILLECTOMY  1971  . TUBAL LIGATION    . [facility]  11/24/2009    FAMILY HISTORY:  Family History  Problem Relation Age of Onset  . Asthma Mother   . Diabetes Mother   . Hypertension Mother   . Allergies Mother   . Rheum arthritis Mother   . Bladder Cancer Father 57       former heavy smoker  . Hypertension Father   . Stroke Father   . Breast cancer Sister 76  . Diabetes Sister        x 3  . Allergies Sister   . Asthma Sister        x 2   . Breast cancer Maternal Aunt 85  . Breast cancer Cousin 17       (maternal) bilateral cancer  . Aneurysm Son 49  . Diabetes Brother   . Allergies Brother   . Asthma Brother     SOCIAL HISTORY:  Social History   Tobacco Use  . Smoking status: Former Smoker    Packs/day: 1.00    Years: 1.00    Pack years: 1.00    Types: Cigarettes    Quit date: 02/11/1982    Years since quitting: 38.3  . Smokeless  tobacco: Never Used  Vaping Use  . Vaping Use: Never used  Substance Use Topics  . Alcohol use: No    Alcohol/week: 0.0 standard drinks  . Drug use: No    ALLERGIES:  Allergies  Allergen Reactions  . Carboplatin Anaphylaxis  . Shellfish Allergy Anaphylaxis    HER THROAT SWELLS UP AND CLOSES  . Vancomycin Rash    Pt developed a large red rash covering her entire arm after infusion. Suspected red-man syndrome vs infiltration  . Benadryl [Diphenhydramine Hcl] Other (See Comments)    Restless legs   . Penicillins Rash    DID THE REACTION INVOLVE: Swelling of the face/tongue/throat, SOB, or low BP? No Sudden or severe rash/hives, skin peeling, or the inside of the mouth or nose? No Did it require medical treatment? No When did it last happen?Childhood allergy If all above answers are "NO", may proceed with cephalosporin use.     MEDICATIONS:  Current Outpatient Medications  Medication Sig Dispense Refill  . calcium elemental as carbonate (BARIATRIC TUMS ULTRA) 400 MG  chewable tablet Chew 1 tablet by mouth 2 (two) times daily.    . Cholecalciferol (VITAMIN D) 50 MCG (2000 UT) tablet Take 2,000 Units by mouth at bedtime.    Marland Kitchen dexamethasone (DECADRON) 4 MG tablet Start the day before Taxotere, take 1 tablet in the morning, and then 1 tablet daily after chemo for 2 days. Pls dispense 30 tabs 30 tablet 1  . gabapentin (NEURONTIN) 300 MG capsule TAKE 2 CAPSULES(600 MG) BY MOUTH TWICE DAILY (Patient taking differently: Take 900 mg by mouth 2 (two) times daily.) 120 capsule 11  . HUMALOG MIX 75/25 KWIKPEN (75-25) 100 UNIT/ML Kwikpen Inject 25 Units into the skin 2 (two) times daily. Before breakfast & before supper Except when taking Chemo take 32 units  For three to five days    . HYDROcodone-acetaminophen (NORCO/VICODIN) 5-325 MG tablet Take 1 tablet by mouth every 6 (six) hours as needed for moderate pain. 60 tablet 0  . lidocaine-prilocaine (EMLA) cream Apply to Porta-cath  1-2  hours prior to access as directed. (Patient taking differently: Apply 1 application topically daily as needed (port access).) 30 g 9  . nadolol (CORGARD) 20 MG tablet Take 1 tablet (20 mg total) by mouth daily. (Patient taking differently: Take 20 mg by mouth at bedtime.) 90 tablet 3  . ondansetron (ZOFRAN) 8 MG tablet Take 1 tablet (8 mg total) by mouth every 8 (eight) hours as needed. (Patient taking differently: Take 8 mg by mouth every 8 (eight) hours as needed for nausea.) 60 tablet 1  . pantoprazole (PROTONIX) 40 MG tablet Take 1 tablet (40 mg total) by mouth daily.    . prochlorperazine (COMPAZINE) 10 MG tablet Take 1 tablet (10 mg total) by mouth every 6 (six) hours as needed (Nausea or vomiting). 30 tablet 1  . rotigotine (NEUPRO) 4 MG/24HR APPLY 1 PATCH EXTERNALLY TO THE SKIN DAILY (Patient taking differently: Place 1 patch onto the skin daily.) 30 patch 6   No current facility-administered medications for this encounter.    REVIEW OF SYSTEMS:  A 10+ POINT REVIEW OF SYSTEMS WAS OBTAINED including neurology, dermatology, psychiatry, cardiac, respiratory, lymph, extremities, GI, GU, musculoskeletal, constitutional, reproductive, HEENT.  She denies any pain within the pelvis area.  She denies any use swelling in her left lower extremity.  She has chronic pain in the lower back region.   PHYSICAL EXAM:  height is 5' 1"  (1.549 m) and weight is 196 lb 6.4 oz (89.1 kg). Her temperature is 97.9 F (36.6 C). Her blood pressure is 122/60 and her pulse is 56 (abnormal). Her respiration is 20 and oxygen saturation is 100%.   General: Alert and oriented, in no acute distress HEENT: Head is normocephalic. Extraocular movements are intact. Oropharynx is clear. Neck: Neck is supple, no palpable cervical or supraclavicular lymphadenopathy. Heart: Regular in rate and rhythm with no murmurs, rubs, or gallops. Chest: Clear to auscultation bilaterally, with no rhonchi, wheezes, or rales. Abdomen: Soft,  nontender, nondistended, with no rigidity or guarding. Extremities: No cyanosis or edema. Lymphatics: see Neck Exam Skin: No concerning lesions. Musculoskeletal: symmetric strength and muscle tone throughout. Neurologic: Cranial nerves II through XII are grossly intact. No obvious focalities. Speech is fluent. Coordination is intact. Psychiatric: Judgment and insight are intact. Affect is appropriate.   ECOG = 1  0 - Asymptomatic (Fully active, able to carry on all predisease activities without restriction)  1 - Symptomatic but completely ambulatory (Restricted in physically strenuous activity but ambulatory and able to carry out work  of a light or sedentary nature. For example, light housework, office work)  2 - Symptomatic, <50% in bed during the day (Ambulatory and capable of all self care but unable to carry out any work activities. Up and about more than 50% of waking hours)  3 - Symptomatic, >50% in bed, but not bedbound (Capable of only limited self-care, confined to bed or chair 50% or more of waking hours)  4 - Bedbound (Completely disabled. Cannot carry on any self-care. Totally confined to bed or chair)  5 - Death   Eustace Pen MM, Creech RH, Tormey DC, et al. [phone]). "Toxicity and response criteria of the [facility]". [place] Oncol. 5 (6): 649-55  LABORATORY DATA:  Lab Results  Component Value Date   WBC 4.0 05/22/2020   HGB 12.5 05/22/2020   HCT 37.8 05/22/2020   MCV 92.0 05/22/2020   PLT 82 (L) 05/22/2020   NEUTROABS 2.5 05/22/2020   Lab Results  Component Value Date   NA 142 05/22/2020   K 4.2 05/22/2020   CL 107 05/22/2020   CO2 24 05/22/2020   GLUCOSE 129 (H) 05/22/2020   CREATININE 0.90 05/22/2020   CALCIUM 8.8 (L) 05/22/2020      RADIOGRAPHY: CT ABDOMEN PELVIS W CONTRAST  Result Date: 05/22/2020 CLINICAL DATA:  Ovarian cancer restaging EXAM: CT ABDOMEN AND PELVIS WITH CONTRAST TECHNIQUE: Multidetector CT imaging of the abdomen  and pelvis was performed using the standard protocol following bolus administration of intravenous contrast. CONTRAST:  125m OMNIPAQUE IOHEXOL 300 MG/ML SOLN, additional oral enteric contrast COMPARISON:  02/22/2020 FINDINGS: Lower chest: No acute abnormality. Bandlike scarring and or atelectasis of the included bilateral lung bases, unchanged. Bilateral breast implants. Hepatobiliary: Nodular, cirrhotic morphology of the liver. No solid liver abnormality is seen. Tiny gallstone in the gallbladder. No gallbladder wall thickening, or biliary dilatation. Pancreas: Unremarkable. No pancreatic ductal dilatation or surrounding inflammatory changes. Spleen: Splenomegaly, maximum coronal span 15.7 cm. Adrenals/Urinary Tract: Adrenal glands are unremarkable. Kidneys are normal, without renal calculi, solid lesion, or hydronephrosis. Bladder is unremarkable. Stomach/Bowel: Stomach is within normal limits. Appendix appears normal. No evidence of bowel wall thickening, distention, or inflammatory changes. Sigmoid diverticula. Status post omentectomy. Vascular/Lymphatic: Scattered aortic atherosclerosis. Interval enlargement of a left iliac lymph node, measuring 2.0 x 1.4 cm, previously 1.6 x 1.2 cm (series 2, image 76). Otherwise unchanged prominent subcentimeter iliac and retroperitoneal lymph nodes (series 2, image 40, 53). Reproductive: Status post hysterectomy. Other: No abdominal wall hernia or abnormality. No abdominopelvic ascites. There is minimal residual peritoneal thickening and nodularity, unchanged (series 2, image 51, 75). Musculoskeletal: There is a redemonstrated superior endplate deformity of L3. There is a redemonstrated Schmorl morphology lesion of the superior endplate of L4 and the abutting inferior endplate of L3, with new adjacent sclerosis, unchanged in size at approximately 0.9 cm in diameter (series 5, image 58). IMPRESSION: 1. Interval enlargement of a left iliac lymph node, measuring 2.0 x 1.4 cm,  previously 1.6 x 1.2 cm, consistent with worsening nodal metastatic disease. Otherwise unchanged prominent subcentimeter iliac and retroperitoneal lymph nodes. 2. There is minimal residual peritoneal thickening and nodularity about the abdomen and pelvis, unchanged, consistent with treated peritoneal metastatic disease. 3. There is a redemonstrated Schmorl morphology lesion of the superior endplate of L4 and the abutting inferior endplate of L3, with new adjacent sclerosis, unchanged in size at approximately 0.9 cm in diameter. Given appearance, this is favored to reflect an incidental mechanical Schmorl deformity, however osseous metastatic disease  is difficult to strictly exclude. Continued attention on follow-up. 4. Status post hysterectomy and omentectomy. 5. Cirrhosis and stigmata of portal hypertension including splenomegaly. 6. Cholelithiasis. Aortic Atherosclerosis (ICD10-I70.0). Electronically Signed   By: Eddie Candle M.D.   On: 05/22/2020 15:59      IMPRESSION: Primary peritoneal carcinomatosis  The patient has an isolated area of progression in the left external iliac chain.  This lymph node measures 2 cm in greatest dimension.  Given that this is the only area of progression she would be a candidate for radiation therapy to this region.  Today, I talked to the patient and husband about the findings and work-up thus far.  We discussed the natural history of primary peritoneal carcinomatosis and general treatment, highlighting the role of radiotherapy in the management.  We discussed the available radiation techniques, and focused on the details of logistics and delivery.  We reviewed the anticipated acute and late sequelae associated with radiation in this setting.  The patient was encouraged to ask questions that I answered to the best of my ability.  A patient consent form was discussed and signed.  We retained a copy for our records.  The patient would like to proceed with radiation and will be  scheduled for CT simulation.  PLAN: The patient will return on May 10 for CT simulation with treatments to begin soon afterward.  Anticipate between 2 and 3 weeks of radiation therapy.  Patient has prior obligations next week so CT simulation is not possible during that week.  Total time spent in this encounter was 60 minutes which included reviewing the patient's CT scans, chemotherapy treatments, biopsies, pathology reports, consultations, follow-ups, physical examination, and documentation.  ------------------------------------------------  Blair Promise, PhD, MD  This document serves as a record of services personally performed by Gery Pray, MD. It was created on his behalf by Clerance Lav, a trained medical scribe. The creation of this record is based on the scribe's personal observations and the provider's statements to them. This document has been checked and approved by the attending provider.

## 2020-06-09 ENCOUNTER — Other Ambulatory Visit (HOSPITAL_COMMUNITY)
Admission: RE | Admit: 2020-06-09 | Discharge: 2020-06-09 | Disposition: A | Discharge status: Home / Self Care | Payer: Medicare Other | Source: Ambulatory Visit | Attending: Gastroenterology | Admitting: Gastroenterology

## 2020-06-09 ENCOUNTER — Other Ambulatory Visit: Payer: Self-pay

## 2020-06-09 DIAGNOSIS — Z20822 Contact with and (suspected) exposure to covid-19: Secondary | ICD-10-CM | POA: Diagnosis not present

## 2020-06-09 DIAGNOSIS — Z01812 Encounter for preprocedural laboratory examination: Secondary | ICD-10-CM | POA: Insufficient documentation

## 2020-06-09 LAB — SARS CORONAVIRUS 2 (TAT 6-24 HRS): SARS Coronavirus 2: NEGATIVE

## 2020-06-10 ENCOUNTER — Other Ambulatory Visit: Payer: Self-pay | Admitting: Hematology and Oncology

## 2020-06-12 NOTE — H&P (Signed)
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        Colonoscopy from 09/2018 showed removal of hyperplastic polyps, diverticulosis, internal and external hemorrhoids.        Last EGD was performed as an inpatient by Dr. Paulita Fujita in 8/21 and noted to have red blood in cardia and fundus with grade 1 esophageal varices, no obvious source of bleeding noted.        Labs on epic from 2/22, showed         TB/AST/ALT/ALP of 0.09/09/33/138        hemoglobin 12, platelet 90        BUN 17, creatinine 0.91, normal electrolytes        CT of the chest abdomen and pelvis from 1/22 showed L4 lesion, cirrhosis, portal hypertension, splenomegaly.        She has been off chemotherapy for 5 months as her platelets were low, her DM was not controlled.        She has been doing well with swallowing, denies difficulty or pain on swallowing.        Denies acid reflux or heartburn. She is on pantoprazole once a day.        She has a lot of nausea, no abdominal pain, she takes ondansetron as needed and it helps.        She has regular Bms, denies blood in stool or black stools.        She reports a fair appetite but denies unintentional weight loss.        She complains of back pain.     Current Medications  Taking  .Nadolol 20 MG Tablet TAKE 1 TABLET BY MOUTH EVERY DAY  .Gabapentin 300 MG Capsule 3 capsules Orally Twice a day .HYDROcodone-Acetaminophen 5-325 MG Tablet 1 tablet as needed Orally every 6 hrs .Pantoprazole Sodium 40 MG Tablet Delayed Release 1 tablet Orally Once a day .Neupro(Rotigotine) 4 MG/24HR Patch 24 Hour 1 patch to skin Transdermal  .Zofran(Ondansetron HCl) 8 MG Tablet 1 tablet as needed Orally  .Vitamin D (Cholecalciferol) 25 MCG (1000 UT) Tablet 1 tablet Orally Once a day .Lidocaine 4 % Cream 1 application as needed Externally  .HumaLOG(Insulin Lispro) 100 UNIT/ML Solution as directed  Subcutaneous twice a day Not-Taking  .Tyler Aas FlexTouch(Insulin Degludec) 100 UNIT/ML Solution Pen-injector 18 units Subcutaneous once daily .Jardiance 19m . Tablets 1 tablet by mouth once daily, taken in the morning, with or without food, Notes: Dr. STobie Lords.Dexamethasone 4 MG Tablet 1 tablet Orally Once a day .Ciprofloxacin HCl 500 MG Tablet 1 tablet Orally Twice a day .metroNIDAZOLE 500 MG Tablet 1 tablet Orally Twice a day .HCTZ 12.5 MG Tablet 1 tablet Orally Once a day .Vancomycin HCl 125 MG Capsule 1 capsule Orally Four times a day .Sucralfate 1 GM/10ML Suspension TAKE 10 MLS BY MOUTH FOUR TIMES DAILY FOR 14 DAYS     Medication List reviewed and reconciled with the patient    Past Medical History       Acute GI bleeding.       Esophageal varices.       Duodenal mass.       Cirrhosis-NASH.       Ovarian cancer .       Diabetes mellitus.       Overactive bladder .       Non-ST elevated myocardial infarction (  non-STEMI) ([place]).       Glaucoma, narrow-angle.       Portal hypertension.       Restless leg syndrome.       Asthma .       Iron def anemia .       Peritoneal carcinoma.   Surgical History        EGD-Banding 2019,2020, 02/2019, 05/2019        Orif ankle fracture (Right)         Salpingoophorectomy (Bilateral) 2016        Cardiac catheterization 2012        Partial hysterectomy 1988        Tonsillectomy 1971        Tubal ligation         Eye surgery (Bilateral)         Dental surgery         Port cath placement         EGD 2020        Colonoscopy 2020     Family History  Father: deceased, Bladder cancer, diagnosed with Diabetes, Hypertension  Mother: deceased, diagnosed with Diabetes  No Family History of Colon Cancer, Polyps, or Liver Disease.     Social History  General:   Tobacco use       cigarettes:  Former smoker     Quit in year  02/11/1982     Tobacco history last updated  05/15/2020 no Alcohol.  no Recreational drug use.       Allergies  Shellfish-derived Products: anaphylaxis - Allergy  Benadryl: restless legs - Side Effects  Penicillin V Potassium: rash - Side Effects  Tape: rash - Side Effects  Carboplatin: Stop breathing - Side Effects  Vancomycin HCl: Blood vessels - Side Effects   Hospitalization/Major Diagnostic Procedure  NASH 02-2018  Fell - head accident. Medication reaction to chemo 12/2018  not within the past yr 05/2020       Review of Systems  GI PROCEDURE:          Pacemaker/ AICD no.  Artificial heart valves no.  MI/heart attack no.  Abnormal heart rhythm no.  Angina no.  CVA no.  Hypertension no.  Hypotension no.  Asthma, COPD YES.  Sleep apnea no.  Seizure disorders no.  Artificial joints no.  Severe DJD no.  Diabetes YES, type II.  Significant headaches no.  Vertigo no.  Depression/anxiety no.  Abnormal bleeding no.  Kidney Disease no.  Liver disease YES, non-alcoholic cirrhosis.  Chance of pregnancy no.  Blood transfusion YES, in the past .      porticath.      Vital Signs  Wt 195.4, Wt change 8.4 lb, Ht 61, BMI 36.92, Temp 97.1, Pulse sitting 70, BP sitting 162/83.    Examination  Gastroenterology::        GENERAL APPEARANCE: Well developed, overweight, no active distress, pleasant.         SCLERA: anicteric.         CARDIOVASCULAR Normal RRR .         RESPIRATORY Breath sounds normal. Respiration even and unlabored.         ABDOMEN No masses palpated. Liver and spleen not palpated, normal. Bowel sounds normal, Abdomen not distended.         EXTREMITIES: No edema.         NEURO: alert, oriented to time, place and person, normal gait.         PSYCH: mood/affect  normal.       Assessments  1. Other cirrhosis of liver - K74.69 (Primary)  2. History of esophageal varices - Z87.19  3. Malignant neoplasm of unspecified ovary - C56.9  4. Disseminated malignant neoplasm, unspecified - C80.0     Treatment  1. Other cirrhosis of liver   Notes: No hepatic encephalopathy or ascites  noted. Recent labs unremarkable except for thrombocytopenia. Recommend continued monitoring. She is scheduled for a CAT scan this week for reevaluation of metastatic ovarian cancer.      2. History of esophageal varices         IMAGING: EGD with Band Ligation of Varices               Notes: Will schedule for an EGD with banding of esophageal varices at [facility] as an outpatient. The risks and benefits of the procedure were discussed with the patient in details. She understands and verbalizes consent. Currently she is a nadolol 20 milligrams once a day,with a heart rate of 70 per minute.        3. Malignant neoplasm of unspecified ovary   Notes: She has been off chemotherapy for 5 months, follows up with her oncologist and has a repeat CAT scan scheduled this week for reevaluation and staging.

## 2020-06-13 ENCOUNTER — Ambulatory Visit (HOSPITAL_COMMUNITY): Payer: Medicare Other | Admitting: Registered Nurse

## 2020-06-13 ENCOUNTER — Other Ambulatory Visit: Payer: Self-pay

## 2020-06-13 ENCOUNTER — Encounter (HOSPITAL_COMMUNITY)
Admission: RE | Disposition: A | Discharge status: Home / Self Care | Payer: Self-pay | Source: Home / Self Care | Attending: Gastroenterology

## 2020-06-13 ENCOUNTER — Ambulatory Visit (HOSPITAL_COMMUNITY)
Admission: RE | Admit: 2020-06-13 | Discharge: 2020-06-13 | Disposition: A | Discharge status: Home / Self Care | Payer: Medicare Other | Attending: Gastroenterology | Admitting: Gastroenterology

## 2020-06-13 ENCOUNTER — Ambulatory Visit: Payer: Medicare Other | Admitting: Radiation Oncology

## 2020-06-13 ENCOUNTER — Encounter (HOSPITAL_COMMUNITY): Payer: Self-pay | Admitting: Gastroenterology

## 2020-06-13 DIAGNOSIS — Z888 Allergy status to other drugs, medicaments and biological substances status: Secondary | ICD-10-CM | POA: Insufficient documentation

## 2020-06-13 DIAGNOSIS — K7469 Other cirrhosis of liver: Secondary | ICD-10-CM | POA: Diagnosis not present

## 2020-06-13 DIAGNOSIS — Z880 Allergy status to penicillin: Secondary | ICD-10-CM | POA: Insufficient documentation

## 2020-06-13 DIAGNOSIS — H42 Glaucoma in diseases classified elsewhere: Secondary | ICD-10-CM | POA: Diagnosis not present

## 2020-06-13 DIAGNOSIS — K766 Portal hypertension: Secondary | ICD-10-CM | POA: Insufficient documentation

## 2020-06-13 DIAGNOSIS — K3189 Other diseases of stomach and duodenum: Secondary | ICD-10-CM | POA: Diagnosis not present

## 2020-06-13 DIAGNOSIS — Z8052 Family history of malignant neoplasm of bladder: Secondary | ICD-10-CM | POA: Diagnosis not present

## 2020-06-13 DIAGNOSIS — K259 Gastric ulcer, unspecified as acute or chronic, without hemorrhage or perforation: Secondary | ICD-10-CM | POA: Diagnosis not present

## 2020-06-13 DIAGNOSIS — Z87891 Personal history of nicotine dependence: Secondary | ICD-10-CM | POA: Insufficient documentation

## 2020-06-13 DIAGNOSIS — Z79899 Other long term (current) drug therapy: Secondary | ICD-10-CM | POA: Diagnosis not present

## 2020-06-13 DIAGNOSIS — Z91048 Other nonmedicinal substance allergy status: Secondary | ICD-10-CM | POA: Insufficient documentation

## 2020-06-13 DIAGNOSIS — E1139 Type 2 diabetes mellitus with other diabetic ophthalmic complication: Secondary | ICD-10-CM | POA: Diagnosis not present

## 2020-06-13 DIAGNOSIS — Z794 Long term (current) use of insulin: Secondary | ICD-10-CM | POA: Diagnosis not present

## 2020-06-13 DIAGNOSIS — I8510 Secondary esophageal varices without bleeding: Secondary | ICD-10-CM | POA: Insufficient documentation

## 2020-06-13 DIAGNOSIS — Z86010 Personal history of colonic polyps: Secondary | ICD-10-CM | POA: Insufficient documentation

## 2020-06-13 DIAGNOSIS — I10 Essential (primary) hypertension: Secondary | ICD-10-CM | POA: Insufficient documentation

## 2020-06-13 DIAGNOSIS — K7581 Nonalcoholic steatohepatitis (NASH): Secondary | ICD-10-CM | POA: Insufficient documentation

## 2020-06-13 DIAGNOSIS — Z91013 Allergy to seafood: Secondary | ICD-10-CM | POA: Insufficient documentation

## 2020-06-13 DIAGNOSIS — Z7952 Long term (current) use of systemic steroids: Secondary | ICD-10-CM | POA: Diagnosis not present

## 2020-06-13 DIAGNOSIS — Z8249 Family history of ischemic heart disease and other diseases of the circulatory system: Secondary | ICD-10-CM | POA: Insufficient documentation

## 2020-06-13 DIAGNOSIS — Z833 Family history of diabetes mellitus: Secondary | ICD-10-CM | POA: Insufficient documentation

## 2020-06-13 HISTORY — PX: GASTRIC VARICES BANDING: SHX5519

## 2020-06-13 HISTORY — PX: ESOPHAGOGASTRODUODENOSCOPY (EGD) WITH PROPOFOL: SHX5813

## 2020-06-13 LAB — GLUCOSE, CAPILLARY: Glucose-Capillary: 178 mg/dL — ABNORMAL HIGH (ref 70–99)

## 2020-06-13 SURGERY — ESOPHAGOGASTRODUODENOSCOPY (EGD) WITH PROPOFOL
Anesthesia: Monitor Anesthesia Care

## 2020-06-13 MED ORDER — HYDROMORPHONE HCL 1 MG/ML IJ SOLN
INTRAMUSCULAR | Status: AC
  Filled 2020-06-13: qty 1

## 2020-06-13 MED ORDER — PROPOFOL 500 MG/50ML IV EMUL
INTRAVENOUS | Status: AC
  Filled 2020-06-13: qty 50

## 2020-06-13 MED ORDER — LACTATED RINGERS IV SOLN: INTRAVENOUS | Status: DC

## 2020-06-13 MED ORDER — SODIUM CHLORIDE 0.9 % IV SOLN: INTRAVENOUS | Status: DC

## 2020-06-13 MED ORDER — PROPOFOL 10 MG/ML IV BOLUS
INTRAVENOUS | Status: DC | PRN
  Administered 2020-06-13 (×3): 20 mg via INTRAVENOUS

## 2020-06-13 MED ORDER — PROPOFOL 500 MG/50ML IV EMUL
INTRAVENOUS | Status: DC | PRN
  Administered 2020-06-13: 130 ug/kg/min via INTRAVENOUS

## 2020-06-13 MED ORDER — HYDROMORPHONE BOLUS VIA INFUSION
1.0000 mg | INTRAVENOUS | Status: DC | PRN
  Administered 2020-06-13: 1 mg via INTRAVENOUS

## 2020-06-13 MED ORDER — HYDROMORPHONE HCL 1 MG/ML IJ SOLN: 1.0000 mg | INTRAMUSCULAR | Status: DC | PRN

## 2020-06-13 SURGICAL SUPPLY — 14 items

## 2020-06-13 NOTE — Transfer of Care (Signed)
Immediate Anesthesia Transfer of Care Note  Patient: KELCE BOUTON  Procedure(s) Performed: ESOPHAGOGASTRODUODENOSCOPY (EGD) WITH PROPOFOL (N/A ) GASTRIC VARICES BANDING (N/A )  Patient Location: PACU and Endoscopy Unit  Anesthesia Type:MAC  Level of Consciousness: awake, alert , oriented and patient cooperative  Airway & Oxygen Therapy: Patient Spontanous Breathing and Patient connected to face mask oxygen  Post-op Assessment: Report given to RN, Post -op Vital signs reviewed and stable and Patient moving all extremities  Post vital signs: Reviewed and stable  Last Vitals:  Vitals Value Taken Time  BP    Temp 35.4 C 06/13/20 0805  Pulse 68 06/13/20 0806  Resp 29 06/13/20 0806  SpO2 100 % 06/13/20 0806  Vitals shown include unvalidated device data.  Last Pain:  Vitals:   06/13/20 0707  TempSrc: Oral  PainSc: 0-No pain         Complications: No complications documented.

## 2020-06-13 NOTE — Progress Notes (Signed)
VAST consult received to access implanted port. Upon finding patient location, staff had obtained a PIV for use instead and canceled consult. Educated staff to provide exact location and a call back number in future.

## 2020-06-13 NOTE — Op Note (Signed)
[facility] Patient Name: Olivia Benson Procedure Date: 06/13/2020 MRN: [id number] Attending MD: Ronnette Juniper , MD Date of Birth: 1945-12-19 CSN: [id number] Age: 75 Admit Type: Inpatient Procedure:                Upper GI endoscopy Indications:              For therapy of esophageal varices Providers:                Ronnette Juniper, MD, Carmie End, RN, Brooke Person,                            Ladona Ridgel, Technician Referring MD:             Beatrix Fetters Medicines:                Monitored Anesthesia Care Complications:            No immediate complications. Estimated blood loss:                            Minimal. Estimated Blood Loss:     Estimated blood loss was minimal. Procedure:                Pre-Anesthesia Assessment:                           - Prior to the procedure, a History and Physical                            was performed, and patient medications and                            allergies were reviewed. The patient's tolerance of                            previous anesthesia was also reviewed. The risks                            and benefits of the procedure and the sedation                            options and risks were discussed with the patient.                            All questions were answered, and informed consent                            was obtained. Prior Anticoagulants: The patient has                            taken no previous anticoagulant or antiplatelet                            agents. ASA Grade Assessment: III - A patient with  severe systemic disease. After reviewing the risks                            and benefits, the patient was deemed in                            satisfactory condition to undergo the procedure.                           After obtaining informed consent, the endoscope was                            passed under direct vision. Throughout the                             procedure, the patient's blood pressure, pulse, and                            oxygen saturations were monitored continuously. The                            GIF-H190 ([id number] Olympus gastroscope was                            introduced through the mouth, and advanced to the                            second part of duodenum. The upper GI endoscopy was                            accomplished without difficulty. The patient                            tolerated the procedure well. Scope In: Scope Out: Findings:      The upper third of the esophagus was normal.      Grade II varices were found in the middle third of the esophagus and in       the lower third of the esophagus. They were small in size. Five bands       were deployed, four bands were successfully placed with complete       eradication, resulting in deflation of varices. There was no bleeding       during and at the end of the procedure.      There was evidence of scarring noted in multiple areas of esophagus from       prior banding.      The Z-line was regular.      Striped moderately erythematous mucosa without bleeding was found in the       gastric antrum.      Few non-bleeding superficial gastric ulcers with a clean ulcer base       (Forrest Class III) were found in the gastric antrum. The largest lesion       was 4 mm in largest dimension.      The cardia and gastric fundus were normal on retroflexion. There was no  evidence of gastric varices.      Moderate portal hypertensive gastropathy was found in the gastric body,       on the greater curvature of the stomach, on the lesser curvature of the       stomach, in the gastric antrum and at the pylorus. Mucosal friability       noted.      The examined duodenum was normal. Impression:               - Normal upper third of esophagus.                           - Grade II esophageal varices. Completely                            eradicated. Banded.                            - Z-line regular.                           - Erythematous mucosa in the antrum.                           - Non-bleeding gastric ulcers with a clean ulcer                            base (Forrest Class III).                           - Portal hypertensive gastropathy.                           - Normal examined duodenum.                           - No specimens collected. Moderate Sedation:      Patient did not receive moderate sedation for this procedure, but       instead received monitored anesthesia care. Recommendation:           - Patient has a contact number available for                            emergencies. The signs and symptoms of potential                            delayed complications were discussed with the                            patient. Return to normal activities tomorrow.                            Written discharge instructions were provided to the                            patient.                           -  Clear liquid diet for 6 hours.                           - Continue present medications, nadolol and                            pantoprazole.                           - Repeat upper endoscopy in 6 months to 1 year for                            retreatment. Procedure Code(s):        --- Professional ---                           [phone], Esophagogastroduodenoscopy, flexible,                            transoral; with band ligation of esophageal/gastric                            varices Diagnosis Code(s):        --- Professional ---                           I85.00, Esophageal varices without bleeding                           K31.89, Other diseases of stomach and duodenum                           K25.9, Gastric ulcer, unspecified as acute or                            chronic, without hemorrhage or perforation                           K76.6, Portal hypertension CPT copyright 2019 American Medical Association. All rights reserved. The  codes documented in this report are preliminary and upon coder review may  be revised to meet current compliance requirements. Ronnette Juniper, MD 06/13/2020 8:08:41 AM This report has been signed electronically. Number of Addenda: 0

## 2020-06-13 NOTE — Interval H&P Note (Signed)
History and Physical Interval Note: 74/female with NASH related cirrhosis and esophageal varices on nadolol, for an EGD with possible banding with propofol.  06/13/2020 7:39 AM  [name]  has presented today for EGD with possible banding, with the diagnosis of Esophageal varices, Other cirrhosis of liver.  The various methods of treatment have been discussed with the patient and family. After consideration of risks, benefits and other options for treatment, the patient has consented to  Procedure(s): ESOPHAGOGASTRODUODENOSCOPY (EGD) WITH PROPOFOL (N/A) GASTRIC VARICES BANDING (N/A) as a surgical intervention.  The patient's history has been reviewed, patient examined, no change in status, stable for surgery.  I have reviewed the patient's chart and labs.  Questions were answered to the patient's satisfaction.     Ronnette Juniper

## 2020-06-13 NOTE — Anesthesia Preprocedure Evaluation (Addendum)
Anesthesia Evaluation  Patient identified by MRN, date of birth, ID band Patient awake    Reviewed: Allergy & Precautions, NPO status , Patient's Chart, lab work & pertinent test results  History of Anesthesia Complications (+) PONV  Airway Mallampati: III  TM Distance: >3 FB Neck ROM: Full    Dental no notable dental hx. (+) Teeth Intact, Dental Advisory Given   Pulmonary asthma , former smoker,    Pulmonary exam normal breath sounds clear to auscultation       Cardiovascular hypertension, + Past MI and +CHF  Normal cardiovascular exam Rhythm:Regular Rate:Normal  TTE 2020 EF 60-65%, G1DD, valves ok   Neuro/Psych negative neurological ROS  negative psych ROS   GI/Hepatic negative GI ROS, (+) Cirrhosis  (NASH)  Esophageal Varices    ,   Endo/Other  negative endocrine ROSdiabetes, Type 2, Insulin Dependent  Renal/GU Renal InsufficiencyRenal disease  negative genitourinary   Musculoskeletal negative musculoskeletal ROS (+)   Abdominal   Peds  Hematology negative hematology ROS (+)   Anesthesia Other Findings   Reproductive/Obstetrics H/o ovarian CA not currently on chemo or radiation                            Anesthesia Physical Anesthesia Plan  ASA: III  Anesthesia Plan: MAC   Post-op Pain Management:    Induction: Intravenous  PONV Risk Score and Plan: 3 and Propofol infusion and Treatment may vary due to age or medical condition  Airway Management Planned: Natural Airway  Additional Equipment:   Intra-op Plan:   Post-operative Plan:   Informed Consent: I have reviewed the patients History and Physical, chart, labs and discussed the procedure including the risks, benefits and alternatives for the proposed anesthesia with the patient or authorized representative who has indicated his/her understanding and acceptance.     Dental advisory given  Plan Discussed with:  CRNA  Anesthesia Plan Comments:         Anesthesia Quick Evaluation

## 2020-06-13 NOTE — Anesthesia Postprocedure Evaluation (Signed)
Anesthesia Post Note  Patient: ARITZEL KRUSEMARK  Procedure(s) Performed: ESOPHAGOGASTRODUODENOSCOPY (EGD) WITH PROPOFOL (N/A ) GASTRIC VARICES BANDING (N/A )     Patient location during evaluation: Endoscopy Anesthesia Type: MAC Level of consciousness: awake and alert Pain management: pain level controlled Vital Signs Assessment: post-procedure vital signs reviewed and stable Respiratory status: spontaneous breathing, nonlabored ventilation, respiratory function stable and patient connected to nasal cannula oxygen Cardiovascular status: blood pressure returned to baseline and stable Postop Assessment: no apparent nausea or vomiting Anesthetic complications: no   No complications documented.  Last Vitals:  Vitals:   06/13/20 0840 06/13/20 0850  BP: (!) 171/82 (!) 177/77  Pulse: 62 66  Resp: 10 17  Temp:    SpO2: 96% 98%    Last Pain:  Vitals:   06/13/20 0850  TempSrc:   PainSc: 4                  Brucha Ahlquist L Anaissa Macfadden

## 2020-06-13 NOTE — Discharge Instructions (Signed)
YOU HAD AN ENDOSCOPIC PROCEDURE TODAY: Refer to the procedure report and other information in the discharge instructions given to you for any specific questions about what was found during the examination. If this information does not answer your questions, please call [facility] office at [phone] to clarify.   YOU SHOULD EXPECT: Some feelings of bloating in the abdomen. Passage of more gas than usual. Walking can help get rid of the air that was put into your GI tract during the procedure and reduce the bloating. If you had a lower endoscopy (such as a colonoscopy or flexible sigmoidoscopy) you may notice spotting of blood in your stool or on the toilet paper. Some abdominal soreness may be present for a day or two, also.  DIET: Your first meal following the procedure should be a light meal and then it is ok to progress to your normal diet. A half-sandwich or bowl of soup is an example of a good first meal. Heavy or fried foods are harder to digest and may make you feel nauseous or bloated. Drink plenty of fluids but you should avoid alcoholic beverages for 24 hours. If you had a esophageal dilation, please see attached instructions for diet.    ACTIVITY: Your care partner should take you home directly after the procedure. You should plan to take it easy, moving slowly for the rest of the day. You can resume normal activity the day after the procedure however YOU SHOULD NOT DRIVE, use power tools, machinery or perform tasks that involve climbing or major physical exertion for 24 hours (because of the sedation medicines used during the test).   SYMPTOMS TO REPORT IMMEDIATELY: A gastroenterologist can be reached at any hour. Please call [phone]  for any of the following symptoms:  . Following lower endoscopy (colonoscopy, flexible sigmoidoscopy) Excessive amounts of blood in the stool  Significant tenderness, worsening of abdominal pains  Swelling of the abdomen that is new, acute  Fever of 100  or higher  . Following upper endoscopy (EGD, EUS, ERCP, esophageal dilation) Vomiting of blood or coffee ground material  New, significant abdominal pain  New, significant chest pain or pain under the shoulder blades  Painful or persistently difficult swallowing  New shortness of breath  Black, tarry-looking or red, bloody stools  FOLLOW UP:  If any biopsies were taken you will be contacted by phone or by letter within the next 1-3 weeks. Call [phone]  if you have not heard about the biopsies in 3 weeks.  Please also call with any specific questions about appointments or follow up tests.YOU HAD AN ENDOSCOPIC PROCEDURE TODAY: Refer to the procedure report and other information in the discharge instructions given to you for any specific questions about what was found during the examination. If this information does not answer your questions, please call [place] office at [phone] to clarify.   YOU SHOULD EXPECT: Some feelings of bloating in the abdomen. Passage of more gas than usual. Walking can help get rid of the air that was put into your GI tract during the procedure and reduce the bloating. If you had a lower endoscopy (such as a colonoscopy or flexible sigmoidoscopy) you may notice spotting of blood in your stool or on the toilet paper. Some abdominal soreness may be present for a day or two, also.  DIET: Your first meal following the procedure should be a light meal and then it is ok to progress to your normal diet. A half-sandwich or bowl of soup is an example  of a good first meal. Heavy or fried foods are harder to digest and may make you feel nauseous or bloated. Drink plenty of fluids but you should avoid alcoholic beverages for 24 hours. If you had a esophageal dilation, please see attached instructions for diet.    ACTIVITY: Your care partner should take you home directly after the procedure. You should plan to take it easy, moving slowly for the rest of the day. You can resume  normal activity the day after the procedure however YOU SHOULD NOT DRIVE, use power tools, machinery or perform tasks that involve climbing or major physical exertion for 24 hours (because of the sedation medicines used during the test).   SYMPTOMS TO REPORT IMMEDIATELY: A gastroenterologist can be reached at any hour. Please call [phone]  for any of the following symptoms:  . Following lower endoscopy (colonoscopy, flexible sigmoidoscopy) Excessive amounts of blood in the stool  Significant tenderness, worsening of abdominal pains  Swelling of the abdomen that is new, acute  Fever of 100 or higher  . Following upper endoscopy (EGD, EUS, ERCP, esophageal dilation) Vomiting of blood or coffee ground material  New, significant abdominal pain  New, significant chest pain or pain under the shoulder blades  Painful or persistently difficult swallowing  New shortness of breath  Black, tarry-looking or red, bloody stools  FOLLOW UP:  If any biopsies were taken you will be contacted by phone or by letter within the next 1-3 weeks. Call [phone]  if you have not heard about the biopsies in 3 weeks.  Please also call with any specific questions about appointments or follow up tests.

## 2020-06-13 NOTE — Brief Op Note (Signed)
06/13/2020  8:09 AM  PATIENT:  Olivia Benson  75 y.o. female  PRE-OPERATIVE DIAGNOSIS:  Esophageal varices, Other cirrhosis of liver  POST-OPERATIVE DIAGNOSIS:  G2 varices, 5 bands, portal htn, gastric ulcers  PROCEDURE:  Procedure(s): ESOPHAGOGASTRODUODENOSCOPY (EGD) WITH PROPOFOL (N/A) GASTRIC VARICES BANDING (N/A)  SURGEON:  Surgeon(s) and Role:    Ronnette Juniper, MD - Primary  PHYSICIAN ASSISTANT: Carmie End, RN, Brooke Person, RN, Ladona Ridgel, Tech  ASSISTANTS: none   ANESTHESIA:   MAC  EBL: Minimal  BLOOD ADMINISTERED:none  DRAINS: none   LOCAL MEDICATIONS USED:  NONE  SPECIMEN: None  DISPOSITION OF SPECIMEN:  N/A  COUNTS:  YES  TOURNIQUET:  * No tourniquets in log *  DICTATION: .Dragon Dictation  PLAN OF CARE: Discharge to home after PACU  PATIENT DISPOSITION:  PACU - hemodynamically stable.   Delay start of Pharmacological VTE agent (>24hrs) due to surgical blood loss or risk of bleeding: not applicable

## 2020-06-14 ENCOUNTER — Encounter (HOSPITAL_COMMUNITY): Payer: Self-pay | Admitting: Gastroenterology

## 2020-06-19 ENCOUNTER — Telehealth: Payer: Self-pay | Admitting: Neurology

## 2020-06-19 MED ORDER — NEUPRO 4 MG/24HR TD PT24: MEDICATED_PATCH | TRANSDERMAL | 2 refills | Status: DC

## 2020-06-19 NOTE — Telephone Encounter (Signed)
Patient called and left a message she'd like a call back from a nurse. She said she's been trying to get her Neupro patch refilled to no avail.   Please call patient.

## 2020-06-19 NOTE — Telephone Encounter (Signed)
Called patient and informed her that her refill request has been sent. Patient verbalized understanding.

## 2020-06-20 ENCOUNTER — Other Ambulatory Visit: Payer: Self-pay

## 2020-06-20 ENCOUNTER — Ambulatory Visit
Admission: RE | Admit: 2020-06-20 | Discharge: 2020-06-20 | Disposition: A | Discharge status: Home / Self Care | Payer: Medicare Other | Source: Ambulatory Visit | Attending: Radiation Oncology | Admitting: Radiation Oncology

## 2020-06-20 ENCOUNTER — Telehealth: Payer: Self-pay | Admitting: Oncology

## 2020-06-20 DIAGNOSIS — C482 Malignant neoplasm of peritoneum, unspecified: Secondary | ICD-10-CM | POA: Insufficient documentation

## 2020-06-20 DIAGNOSIS — C786 Secondary malignant neoplasm of retroperitoneum and peritoneum: Secondary | ICD-10-CM

## 2020-06-20 DIAGNOSIS — Z510 Encounter for antineoplastic radiation therapy: Secondary | ICD-10-CM | POA: Diagnosis not present

## 2020-06-20 NOTE — Telephone Encounter (Signed)
Called Olivia Benson and advised her of follow up with Dr. Alvy Bimler on 07/11/20 at 8:20.  She verbalized understanding and agreement of appointment.

## 2020-06-22 DIAGNOSIS — Z510 Encounter for antineoplastic radiation therapy: Secondary | ICD-10-CM | POA: Diagnosis not present

## 2020-06-27 ENCOUNTER — Other Ambulatory Visit: Payer: Self-pay

## 2020-06-27 ENCOUNTER — Ambulatory Visit
Admission: RE | Admit: 2020-06-27 | Discharge: 2020-06-27 | Disposition: A | Discharge status: Home / Self Care | Payer: Medicare Other | Source: Ambulatory Visit | Attending: Radiation Oncology | Admitting: Radiation Oncology

## 2020-06-27 DIAGNOSIS — C786 Secondary malignant neoplasm of retroperitoneum and peritoneum: Secondary | ICD-10-CM

## 2020-06-27 DIAGNOSIS — Z510 Encounter for antineoplastic radiation therapy: Secondary | ICD-10-CM | POA: Diagnosis not present

## 2020-06-27 DIAGNOSIS — C482 Malignant neoplasm of peritoneum, unspecified: Secondary | ICD-10-CM

## 2020-06-28 ENCOUNTER — Ambulatory Visit
Admission: RE | Admit: 2020-06-28 | Discharge: 2020-06-28 | Disposition: A | Discharge status: Home / Self Care | Payer: Medicare Other | Source: Ambulatory Visit | Attending: Radiation Oncology | Admitting: Radiation Oncology

## 2020-06-28 ENCOUNTER — Telehealth: Payer: Self-pay

## 2020-06-28 DIAGNOSIS — Z510 Encounter for antineoplastic radiation therapy: Secondary | ICD-10-CM | POA: Diagnosis not present

## 2020-06-28 NOTE — Telephone Encounter (Signed)
Pt call and LVM asking if she needs port flush appt when she comes 5/31. This nurse attempted to call pt back. No answer. No voicemail available to leave message.

## 2020-06-29 ENCOUNTER — Ambulatory Visit
Admission: RE | Admit: 2020-06-29 | Discharge: 2020-06-29 | Disposition: A | Discharge status: Home / Self Care | Payer: Medicare Other | Source: Ambulatory Visit | Attending: Radiation Oncology | Admitting: Radiation Oncology

## 2020-06-29 DIAGNOSIS — Z510 Encounter for antineoplastic radiation therapy: Secondary | ICD-10-CM | POA: Diagnosis not present

## 2020-06-30 ENCOUNTER — Other Ambulatory Visit: Payer: Self-pay

## 2020-06-30 ENCOUNTER — Ambulatory Visit
Admission: RE | Admit: 2020-06-30 | Discharge: 2020-06-30 | Disposition: A | Discharge status: Home / Self Care | Payer: Medicare Other | Source: Ambulatory Visit | Attending: Radiation Oncology | Admitting: Radiation Oncology

## 2020-06-30 DIAGNOSIS — Z510 Encounter for antineoplastic radiation therapy: Secondary | ICD-10-CM | POA: Diagnosis not present

## 2020-07-03 ENCOUNTER — Ambulatory Visit
Admission: RE | Admit: 2020-07-03 | Discharge: 2020-07-03 | Disposition: A | Discharge status: Home / Self Care | Payer: Medicare Other | Source: Ambulatory Visit | Attending: Radiation Oncology | Admitting: Radiation Oncology

## 2020-07-03 ENCOUNTER — Other Ambulatory Visit: Payer: Self-pay

## 2020-07-03 DIAGNOSIS — Z510 Encounter for antineoplastic radiation therapy: Secondary | ICD-10-CM | POA: Diagnosis not present

## 2020-07-04 ENCOUNTER — Ambulatory Visit
Admission: RE | Admit: 2020-07-04 | Discharge: 2020-07-04 | Disposition: A | Discharge status: Home / Self Care | Payer: Medicare Other | Source: Ambulatory Visit | Attending: Radiation Oncology | Admitting: Radiation Oncology

## 2020-07-04 DIAGNOSIS — Z510 Encounter for antineoplastic radiation therapy: Secondary | ICD-10-CM | POA: Diagnosis not present

## 2020-07-05 ENCOUNTER — Ambulatory Visit
Admission: RE | Admit: 2020-07-05 | Discharge: 2020-07-05 | Disposition: A | Discharge status: Home / Self Care | Payer: Medicare Other | Source: Ambulatory Visit | Attending: Radiation Oncology | Admitting: Radiation Oncology

## 2020-07-05 ENCOUNTER — Other Ambulatory Visit: Payer: Self-pay

## 2020-07-05 DIAGNOSIS — Z510 Encounter for antineoplastic radiation therapy: Secondary | ICD-10-CM | POA: Diagnosis not present

## 2020-07-06 ENCOUNTER — Ambulatory Visit
Admission: RE | Admit: 2020-07-06 | Discharge: 2020-07-06 | Disposition: A | Discharge status: Home / Self Care | Payer: Medicare Other | Source: Ambulatory Visit | Attending: Radiation Oncology | Admitting: Radiation Oncology

## 2020-07-06 DIAGNOSIS — Z510 Encounter for antineoplastic radiation therapy: Secondary | ICD-10-CM | POA: Diagnosis not present

## 2020-07-07 ENCOUNTER — Ambulatory Visit
Admission: RE | Admit: 2020-07-07 | Discharge: 2020-07-07 | Disposition: A | Discharge status: Home / Self Care | Payer: Medicare Other | Source: Ambulatory Visit | Attending: Radiation Oncology | Admitting: Radiation Oncology

## 2020-07-07 ENCOUNTER — Other Ambulatory Visit: Payer: Self-pay

## 2020-07-07 DIAGNOSIS — Z510 Encounter for antineoplastic radiation therapy: Secondary | ICD-10-CM | POA: Diagnosis not present

## 2020-07-11 ENCOUNTER — Inpatient Hospital Stay (HOSPITAL_BASED_OUTPATIENT_CLINIC_OR_DEPARTMENT_OTHER): Payer: Medicare Other | Admitting: Hematology and Oncology

## 2020-07-11 ENCOUNTER — Other Ambulatory Visit: Payer: Self-pay

## 2020-07-11 ENCOUNTER — Encounter: Payer: Self-pay | Admitting: Hematology and Oncology

## 2020-07-11 ENCOUNTER — Ambulatory Visit
Admission: RE | Admit: 2020-07-11 | Discharge: 2020-07-11 | Disposition: A | Discharge status: Home / Self Care | Payer: Medicare Other | Source: Ambulatory Visit | Attending: Radiation Oncology | Admitting: Radiation Oncology

## 2020-07-11 ENCOUNTER — Telehealth: Payer: Self-pay | Admitting: Hematology and Oncology

## 2020-07-11 VITALS — BP 140/74 | HR 69 | Resp 18 | Ht 61.5 in | Wt 196.4 lb

## 2020-07-11 DIAGNOSIS — M545 Low back pain, unspecified: Secondary | ICD-10-CM

## 2020-07-11 DIAGNOSIS — Z510 Encounter for antineoplastic radiation therapy: Secondary | ICD-10-CM | POA: Diagnosis not present

## 2020-07-11 DIAGNOSIS — G8929 Other chronic pain: Secondary | ICD-10-CM

## 2020-07-11 DIAGNOSIS — C786 Secondary malignant neoplasm of retroperitoneum and peritoneum: Secondary | ICD-10-CM | POA: Diagnosis not present

## 2020-07-11 DIAGNOSIS — C482 Malignant neoplasm of peritoneum, unspecified: Secondary | ICD-10-CM | POA: Diagnosis not present

## 2020-07-11 DIAGNOSIS — C569 Malignant neoplasm of unspecified ovary: Secondary | ICD-10-CM

## 2020-07-11 DIAGNOSIS — Z794 Long term (current) use of insulin: Secondary | ICD-10-CM

## 2020-07-11 DIAGNOSIS — E1140 Type 2 diabetes mellitus with diabetic neuropathy, unspecified: Secondary | ICD-10-CM

## 2020-07-11 NOTE — Telephone Encounter (Signed)
Scheduled appointment per 05/31 schedule message. Patient is aware.

## 2020-07-11 NOTE — Progress Notes (Signed)
[place] OFFICE PROGRESS NOTE  Patient Care Team: Gregor Hams, FNP as PCP - General (Family Medicine) Tat, Eustace Quail, DO as Consulting Physician (Neurology)  ASSESSMENT & PLAN:  Primary peritoneal carcinomatosis [facility]) She has completed radiation therapy today She tolerated radiation very well with minimal side effects Her chronic back pain is stable She has no new abdominal symptoms such as nausea, bloating or changes in bowel habits I will schedule port flush next week and repeat imaging study on August 1 She is in agreement to proceed  Low back pain She has chronic back pain for many years Her last bone density scan from 2 years ago show mild osteopenia She is continue gabapentin I will refill her pain medicine next week  Diabetes mellitus with diabetic neuropathy, with long-term current use of insulin ([place]) We have chronic extensive discussions about the importance of weight loss and dietary modification She has not lost any weight since last time I saw her I warned her about risk of uncontrolled diabetes if we were to put her on chemotherapy in the future I am hopeful she can lose some weight and get her diabetes under control by her next visit   Orders Placed This Encounter  Procedures  . CT ABDOMEN PELVIS W CONTRAST    Standing Status:   Future    Standing Expiration Date:   07/11/2021    Order Specific Question:   If indicated for the ordered procedure, I authorize the administration of contrast media per Radiology protocol    Answer:   Yes    Order Specific Question:   Preferred imaging location?    Answer:   [facility]    Order Specific Question:   Radiology Contrast Protocol - do NOT remove file path    Answer:   [email or web address]    All questions were answered. The patient knows to call the clinic with any problems, questions or concerns. The total time spent in the appointment was 20 minutes  encounter with patients including review of chart and various tests results, discussions about plan of care and coordination of care plan   [name], MD 07/11/2020 9:50 AM  INTERVAL HISTORY: Please see below for problem oriented charting. 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 Had allergic reaction to carboplatin. Avastin was stopped due to GI hemorrhage   Primary peritoneal carcinomatosis ([place])  09/06/2011 Imaging   CT findings consistent with cirrhotic changes involving the liver.  No worrisome liver mass.  There are associated portal venous collaterals and splenomegaly consistent with portal venous hypertension. 2.  No other significant upper abdominal findings   11/08/2013 Imaging   US showed ascites   11/08/2013 Initial Diagnosis   Patient had ~ 2 months of some urinary incontinence, saw Dr Ronita Hipps in 10-2013, with pelvic mass on exam    11/12/2013 Imaging   There are findings of progressive hepatic cirrhosis with a large amount of ascites and mild splenomegaly. There are venous collaterals present. 2. There is abnormal thickening of the peritoneal surface along the left mid and lower abdominal wall. There is abnormal soft tissue density in the pelvis as well which could reflect the clinically suspected ovarian malignancy but the findings are nondiagnostic. 3. There  is a new small left pleural effusion. 4. There is no acute bowel abnormality.   11/18/2013 Imaging   Successful ultrasound guided diagnostic and therapeutic paracentesis yielding 4.1 liters of ascites.    11/18/2013 Pathology Results   PERITONEAL/ASCITIC FLUID (SPECIMEN 1 OF 1 COLLECTED 11/18/13): SEROUS CARCINOMA, PLEASE SEE  COMMENT.   12/01/2013 Tumor Marker   Patient's tumor was tested for the following markers: CA125 Results of the tumor marker test revealed 1938   12/03/2013 Imaging   Successful ultrasound-guided therapeutic paracentesis yielding 3.6 liters of peritoneal fluid.   12/07/2013 - 05/30/2014 Chemotherapy   She received 3 cycles of carboplatin and Taxol, interrupted cycle 4 for surgery.  She subsequently completed 3 more cycles of chemotherapy after surgery   12/20/2013 Imaging   Successful ultrasound-guided diagnostic and therapeutic paracentesis yielding 2.2 liters of peritoneal fluid   12/20/2013 Pathology Results   PERITONEAL/ASCITIC FLUID(SPECIMEN 1 OF 1 COLLECTED 12/20/13): MALIGNANT CELLS CONSISTENT WITH SEROUS CARCINOMA   01/13/2014 Tumor Marker   Patient's tumor was tested for the following markers: CA125 Results of the tumor marker test revealed 227   02/07/2014 Tumor Marker   Patient's tumor was tested for the following markers: CA125 Results of the tumor marker test revealed 130   02/15/2014 Genetic Testing   Genetics testing from 02-2014 normal (OvaNext panel)   02/23/2014 Imaging   Interval decrease in ascites. 2. Morphologic changes in the liver consistent with cirrhosis. 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 Portal vein remains patent at this time. 3. Persistent but decreased abnormal soft tissue attenuation tracking in the omental fat. This may be secondary to interval improvement in metastatic disease. 4. Peritoneal thickening seen in the left abdomen and pelvis on the previous study has decreased and nearly resolved in the interval. This also suggests interval improvement in metastatic disease.    03/07/2014 Procedure   Ultrasound and fluoroscopically guided right internal jugular single lumen power port catheter insertion. Tip in the SVC/RA junction. Catheter ready for use.   03/17/2014 Tumor Marker   Patient's tumor was tested for the  following markers: CA125 Results of the tumor marker test revealed 45   03/29/2014 Pathology Results   1. Omentum, resection for tumor - HIGH GRADE SEROUS CARCINOMA, SEE COMMENT. 2. Ovary and fallopian tube, right - HIGH GRADE SEROUS CARCINOMA, SEE COMMENT. 3. Ovary and fallopian tube, left - HIGH GRADE SEROUS CARCINOMA, SEE COMMENT. Diagnosis Note 1. Nests and clusters of malignant cells are invading the omental tissue (part #1) with associated fibrosis. The cells are pleomorphic with prominent nucleoli. There are scattered psammoma bodies. The ovaries are atrophic and exhibit multiple foci of surface based invasive carcinoma and associated fibrosis. The fallopian tubes have a few foci of carcinoma, also superficially located. There are no precursor lesions noted in the ovaries or fallopian tubes (the entire tubes and ovaries were submitted for evaluation). While there is some retraction artifact, there are several foci suspicious for lymphovascular invasion. 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 Given the fibrosis, there does appear to be a small amount of treatment effect, however, there is abundant residual tumor.    03/29/2014 Surgery   Procedure(s) Performed: 1. Exploratory laparotomy with bilateral salpingo-oophorectomy, omentectomy radical tumor debulking for ovarian cancer .  Surgeon: Thereasa Solo, MD.  Assistant Surgeon: Lahoma Crocker, M.D. Assistant: (an MD assistant was necessary for tissue manipulation, retraction  and positioning due to the complexity of the case and hospital policies).  Operative Findings: 10cm omental cake from hepatic to splenic flexure, densely adherent to transverse colon. Milliary studding of tumor implants (<38m, too numerous in number to count) adherent to the mesentery of the small bowel and small bowel wall. Small  volume (100cc) ascites. Small ovaries bilaterally, densely adherent to the pelvic cul de sac. Left ovary and tube densely adherent to sigmoid colon. Cirrhotic liver with hepatomegaly and splenomegaly.     This represented an optimal cytoreduction (R1) with visible disease residual on the bowel wall and mesentery (millial, <330mimplants).     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 2. 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    08/18/2014 Tumor Marker   Patient's tumor was tested for the following markers: CA125 Results of the tumor marker test revealed 16   10/13/2014 Tumor Marker   Patient's tumor was tested for the following markers: CA125 Results of the tumor marker test revealed 14   11/21/2014 Tumor Marker   Patient's tumor was tested for the following markers: CA125 Results of the tumor marker test revealed 16   12/28/2014 Tumor Marker   Patient's tumor was tested for the following markers: CA125 Results of the tumor marker test revealed 762   02/27/2015 Tumor Marker   Patient's tumor was tested for the following markers: CA125 Results of the tumor marker test revealed 20.2   03/22/2015 Imaging   Filler appearance to the prior exam, with very subtle fluid tracking along portions of the liver, and very minimal nodularity along the right paracolic gutter representing residua from the prior peritoneal cancer. The current abnormalities could simply be post therapy findings rather than necessarily  representing residual malignancy. No new or enlarging lesions are identified. 2. Hepatic cirrhosis and splenomegaly. There is some gastric varices suggesting portal venous hypertension. 3. Left foraminal stenosis at L4-5 due to spurring. There is likely also some central narrowing of the thecal sac at this level. 4. Bibasilar scarring. 5. Chronic mild left mid kidney scarring   03/27/2015 Tumor Marker   Patient's tumor was tested for the following markers: CA125 Results of the tumor marker test revealed 25.3   04/24/2015 Imaging   Disc bulge L2-3 with mild spinal stenosis and right foraminal encroachment. 2. Disc bulge L3-4 with mild spinal stenosis. 3. Multifactorial spinal, left lateral recess and foraminal stenosis L4-5. There is grade 1 anterolisthesis without evident dynamic instability.   05/29/2015 Tumor Marker   Patient's tumor was tested for the following markers: CA125 Results of the tumor marker test revealed 29.9   08/21/2015 Tumor Marker   Patient's tumor was tested for the following markers: CA125 Results of the tumor marker test revealed 45   09/18/2015 Tumor Marker   Patient's tumor was tested for the following markers: CA125 Results of the tumor marker test revealed 47   09/27/2015 Imaging   New small bowel mesenteric nodule and enlarged left external iliac lymph node, highly worrisome for metastatic disease.  2. Cirrhosis and splenomegaly   10/10/2015 Tumor Marker   Patient's tumor was tested for the following markers: CA125 Results of the tumor marker test revealed 53.4   10/10/2015 - 12/11/2015 Chemotherapy   She received 4 cycles of carboplatin only   11/20/2015 Tumor Marker   Patient's tumor was tested for the following markers: CA125 Results of the tumor marker test revealed 41.3   12/20/2015 Imaging   CT: Mild left external iliac lymphadenopathy is slightly decreased. Mildly enlarged lower mesenteric nodule is slightly decreased. 2. No new or progressive metastatic  disease in the abdomen or pelvis. 3. Cirrhosis. Stable subcentimeter hypodense left liver lobe lesion. No new liver lesions. 4. Stable mild splenomegaly.  No ascites. 5. Mild sigmoid diverticulosis.   01/01/2016 -  Anti-estrogen oral therapy   She was placed on tamoxifen. Had letrozole initially but switched to tamoxifen due to poor tolerance   01/15/2016 Tumor Marker   Patient's tumor was tested for the following markers: CA125 Results of the tumor marker test revealed 31.4   02/19/2016 Tumor Marker   Patient's tumor was tested for the following markers: CA125 Results of the tumor marker test revealed 36.5   05/13/2016 Imaging   No evidence of disease progression within the abdomen or pelvis. Previously noted small central mesenteric nodule and left external iliac node appear slightly smaller. 2. Stable changes of hepatic cirrhosis and portal hypertension with associated splenomegaly. No new or enlarging hepatic lesions are identified. 3. No acute findings.   05/13/2016 Tumor Marker   Patient's tumor was tested for the following markers: CA125 Results of the tumor marker test revealed 41.2   06/24/2016 Tumor Marker   Patient's tumor was tested for the following markers: CA125 Results of the tumor marker test revealed 47.3   08/20/2016 Imaging   1. The left external iliac lymph node is slightly increased in size compared to the prior exam, currently 1.1 cm and previously 0.9 cm in diameter. 2. Stable central mesenteric lymph node at 0.9 cm in diameter. 3. Stable trace free pelvic fluid and stable slight thickening along the right paracolic gutter, without well-defined peritoneal nodularity. 4. Other imaging findings of potential clinical significance: Subsegmental atelectasis or scarring in the lung bases. Hepatic cirrhosis. Left renal scarring. Mild splenomegaly. Sigmoid colon diverticulosis. Impingement at L4-5 due to spondylosis and degenerative disc disease broad Schmorl' s nodes at L3-L4.  Pelvic floor laxity   08/23/2016 Tumor Marker   Patient's tumor was tested for the following markers: CA125 Results of the tumor marker test revealed 80.2   09/04/2016 Imaging   LV EF: 60% -   65%   09/12/2016 Tumor Marker   Patient's tumor was tested for the following markers: CA125 Results of the tumor marker test revealed 98.5   09/12/2016 - 01/29/2018 Chemotherapy   The patient received Doxil and Avastin. Avastin was discontinued in Dec 2019 due to GI hemorrhage   09/26/2016 Tumor Marker   Patient's tumor was tested for the following markers: CA125 Results of the tumor marker test revealed 68.9   10/10/2016 Tumor Marker   Patient's tumor was tested for the following markers: CA125 Results of the tumor marker test revealed 65.6   11/07/2016 Tumor Marker   Patient's tumor was tested for the following markers: CA125 Results of the tumor marker test revealed 46.4   11/29/2016 Imaging   Stable mild peritoneal thickening, suspicious for peritoneal carcinomatosis. No new or progressive disease identified. No evidence of ascites.  No significant change in 10 mm left  external iliac and 8 mm mesenteric lymph nodes.  Stable hepatic cirrhosis, and splenomegaly consistent with portal venous hypertension.  Colonic diverticulosis, without radiographic evidence of diverticulitis.   12/02/2016 Imaging   Normal LV size with EF 55%. Basal inferior and basal inferoseptal hypokinesis. Normal RV size and systolic function. No significant valvular abnormalities.   12/05/2016 Tumor Marker   Patient's tumor was tested for the following markers: CA125 Results of the tumor marker test revealed 49.1   01/09/2017 Tumor Marker   Patient's tumor was tested for the following markers: CA125 Results of the tumor marker test revealed 47.2   02/06/2017 Tumor Marker   Patient's tumor was tested for the following markers: CA125 Results of the tumor marker test revealed 44.1   02/18/2017 Imaging   Stable mild  peritoneal thickening. No new or progressive disease identified within the abdomen or pelvis.  Stable tiny sub-cm left external iliac and mesenteric lymph nodes.  Stable hepatic cirrhosis, and splenomegaly consistent with portal venous hypertension. No evidence of hepatic neoplasm.  Colonic diverticulosis, without radiographic evidence of diverticulitis.   03/06/2017 Tumor Marker   Patient's tumor was tested for the following markers: CA125 Results of the tumor marker test revealed 50.4   03/17/2017 Imaging   - Left ventricle: The cavity size was normal. Wall thickness was normal. Systolic function was normal. The estimated ejection fraction was in the range of 55% to 60%. Wall motion was normal; there were no regional wall motion abnormalities. Features are consistent with a pseudonormal left ventricular filling pattern, with concomitant abnormal relaxation and increased filling pressure (grade 2 diastolic dysfunction). - Mitral valve: There was mild regurgitation. - Left atrium: The atrium was mildly dilated   04/03/2017 Tumor Marker   Patient's tumor was tested for the following markers: CA125 Results of the tumor marker test revealed 47.2   05/14/2017 Imaging   CT scan of abdomen and pelvis Stable mild peritoneal thickening. No new or progressive disease within the abdomen or pelvis.  Stable hepatic cirrhosis. Stable splenomegaly, consistent with portal venous hypertension. No evidence of hepatic neoplasm.   05/15/2017 Tumor Marker   Patient's tumor was tested for the following markers: CA125 Results of the tumor marker test revealed 56.1   06/12/2017 Tumor Marker   Patient's tumor was tested for the following markers: CA125 Results of the tumor marker test revealed 49.2   07/10/2017 Tumor Marker   Patient's tumor was tested for the following markers: CA125 Results of the tumor marker test revealed 46.3   08/07/2017 Tumor Marker   Patient's tumor was tested for the following markers:  CA125 Results of the tumor marker test revealed 52.9   08/20/2017 Imaging   1. Mild omental/peritoneal haziness, unchanged. No evidence of new metastatic disease. 2. Cirrhosis with splenomegaly.   09/04/2017 Tumor Marker   Patient's tumor was tested for the following markers: CA125 Results of the tumor marker test revealed 48.5   11/13/2017 Tumor Marker   Patient's tumor was tested for the following markers: CA125 Results of the tumor marker test revealed 55.8   12/17/2017 Imaging   1. No findings to suggest metastatic disease in the abdomen or pelvis. 2. Severe hepatic cirrhosis with evidence of portal hypertension, as demonstrated by dilated portal vein, splenomegaly and portosystemic collateral pathways, including gastric and esophageal varices.  3. Colonic diverticulosis without evidence of acute diverticulitis at this time. 4. Additional incidental findings, as above.   01/16/2018 Tumor Marker   Patient's tumor was tested for the following markers: CA125 Results  of the tumor marker test revealed 63.7   02/10/2018 Procedure   EGD - Recently bleeding grade III and large (> 5 mm) esophageal varices. Completely eradicated. Banded. - Portal hypertensive gastropathy. - Normal examined duodenum. - No specimens collected   02/13/2018 Tumor Marker   Patient's tumor was tested for the following markers: CA125 Results of the tumor marker test revealed 82.7   02/25/2018 Imaging   Bone density showed mild osteopenia   04/08/2018 Tumor Marker   Patient's tumor was tested for the following markers: CA125 Results of the tumor marker test revealed 82.7   04/08/2018 Imaging   1. No definite omental or peritoneal surface lesions. However, there are 2 slowly enlarging lymph nodes noted. One is in the small bowel mesentery and the other is in the left deep pelvis. Could not exclude recurrent disease. 2. Stable advanced cirrhotic changes involving the liver with portal venous hypertension, portal  venous collaterals, esophageal varices and splenomegaly. No worrisome hepatic lesions. 3. Diffuse wall thickening of the colon could suggest diffuse colitis or could be due to low albumin. Recommend correlation with any symptoms such as diarrhea.   05/21/2018 Tumor Marker   Patient's tumor was tested for the following markers: CA125 Results of the tumor marker test revealed 86   09/07/2018 Tumor Marker   Patient's tumor was tested for the following markers:CA-125 Results of the tumor marker test revealed 111   09/29/2018 Tumor Marker   Patient's tumor was tested for the following markers: CA-125 Results of the tumor marker test revealed 121.   09/29/2018 - 11/24/2018 Chemotherapy   The patient had carboplatin and gemzar for chemotherapy treatment.  Chemo was stopped due to allergic reaction to carboplatin   10/27/2018 Tumor Marker   Patient's tumor was tested for the following markers: CA-125 Results of the tumor marker test revealed 99.9   11/24/2018 Tumor Marker   Patient's tumor was tested for the following markers: CA-125 Results of the tumor marker test revealed 81.2   12/14/2018 Tumor Marker   Patient's tumor was tested for the following markers: CA-125 Results of the tumor marker test revealed 81.2   12/14/2018 Imaging   1. Mild improvement in peritoneal disease. Peritoneal nodule within the left posterior pelvis contiguous with the left side of vaginal cuff is slightly decreased in size in the interval. Within the right iliac fossa there is a small peritoneal nodule which has decreased in size in the interval. Central small bowel mesenteric lymph node is not significantly changed. Slight decrease in size left periaortic lymph node.    2. Morphologic features of the liver compatible with cirrhosis. Splenomegaly is noted which may reflect portal venous hypertension.   12/21/2018 Tumor Marker   Patient's tumor was tested for the following markers: CA-125 Results of the tumor marker  test revealed 64.2   12/21/2018 -  Chemotherapy   The patient had taxotere for chemotherapy treatment.     03/02/2019 Tumor Marker   Patient's tumor was tested for the following markers: CA-125. 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   No new or progressive metastatic disease within the abdomen or pelvis.   Hepatic cirrhosis and findings of portal venous hypertension. 1.1 cm low-attenuation lesion within left hepatic lobe, which could represent a regenerative nodule or small hepatocellular carcinoma. Recommend abdomen MRI without and with contrast for further characterization.     04/13/2019 Tumor Marker   Patient's tumor  was tested for the following markers: CA-125 Results of the tumor marker test revealed 36.8   05/26/2019 Tumor Marker   Patient's tumor was tested for the following markers: CA-125 Results of the tumor marker test revealed 31.6.   06/15/2019 Tumor Marker   Patient's tumor was tested for the following markers: CA-125 Results of the tumor marker test revealed 25.4   07/05/2019 Imaging   1. Stable CT examination. Stable peritoneal thickening in the pelvis and paracolic gutters. Stable solid left deep pelvic implant. Stable trace ascites. No new or progressive metastatic disease. 2. Stable cirrhosis. No discrete liver masses. 3. Stable mild-to-moderate splenomegaly. 4. Aortic Atherosclerosis (ICD10-I70.0).   07/27/2019 Tumor Marker   Patient's tumor was tested for the following markers: CA-125 Results of the tumor marker test revealed 35.4   08/17/2019 Tumor Marker   Patient's tumor was tested for the following markers: Ca-125 Results of the tumor marker test revealed 35.7.   09/17/2019 - 09/21/2019 Hospital Admission   She was admitted due to esophageal variceal bleeding presents with hematemesis and melanotic stool with epigastric pain.  GI consulted, started on  octreotide infusion.  Underwent EGD which showed minimal bleeding, remain on octreotide drip, PPI drip.  Hemoglobin trended down therefore received PRBC transfusion.  Over several days her bleeding subsided, hemodynamically remained stable.    09/17/2019 Procedure   EGD report - Esophageal mucosal scarring, from prior esophageal banding sessions. - Grade I esophageal varices. - Red blood in the cardia and in the gastric fundus. - Portal hypertensive gastropathy. - Normal duodenal bulb, first portion of the duodenum and second portion of the duodenum. 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 2. Otherwise stable CT exam. Stable peritoneal thickening within the bilateral deep pelvic peritoneum. Stable left deep pelvic implant.No evidence for progressed disease. 3. Stable splenomegaly. 4. Morphologic changes to the liver compatible with cirrhosis. Trace perihepatic and pelvic ascites.   02/22/2020 Imaging   1. New lucent lesion in the left side of the L4 vertebral body measuring 1 cm, concerning for possible osseous metastasis. Consideration for further evaluation with bone scan is suggested. Close attention on follow-up imaging is also recommended to ensure stability. 2. Previously noted peritoneal thickening and nodularity in the low anatomic pelvis has regressed slightly. 3. Borderline enlarged and mildly enlarged pelvic and retroperitoneal lymph nodes appear similar to slightly increased compared to the prior examination, as above. Continued attention on follow-up studies is recommended.  4. 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 5. Aortic atherosclerosis. 6. Additional incidental findings, as above.   02/22/2020 Tumor Marker   Patient's tumor was tested for the following markers: CA-125 Results of the tumor marker test revealed 30.1   04/06/2020 Tumor Marker   Patient's tumor was tested for the following markers: CA-125 Results of the tumor marker test revealed 35.7     REVIEW OF SYSTEMS:   Constitutional: Denies fevers, chills or abnormal weight loss Eyes: Denies blurriness of vision Ears, nose, mouth, throat, and face: Denies mucositis or sore throat Respiratory: Denies cough, dyspnea or wheezes Cardiovascular: Denies palpitation, chest discomfort or lower extremity swelling Gastrointestinal:  Denies nausea,  heartburn or change in bowel habits Skin: Denies abnormal skin rashes Lymphatics: Denies new lymphadenopathy or easy bruising Neurological:Denies numbness, tingling or new weaknesses Behavioral/Psych: Mood is stable, no new changes  All other systems were reviewed with the patient and are negative.  I have reviewed the past medical history, past surgical history, social history and family history with the patient and they are unchanged from previous note.  ALLERGIES:  is allergic to carboplatin, shellfish allergy, vancomycin, benadryl [diphenhydramine hcl], and penicillins.  MEDICATIONS:  Current Outpatient Medications  Medication Sig Dispense Refill  . calcium elemental as carbonate (BARIATRIC TUMS ULTRA) 400 MG chewable tablet Chew 1 tablet by mouth 2 (two) times daily.    . Cholecalciferol (VITAMIN D) 50 MCG (2000 UT) tablet Take 2,000 Units by mouth at bedtime.    Marland Kitchen dexamethasone (DECADRON) 4 MG tablet Start the day before Taxotere, take 1 tablet in the morning, and then 1 tablet daily after chemo for 2 days. Pls dispense 30 tabs 30 tablet 1  . gabapentin (NEURONTIN) 300 MG capsule TAKE 3  CAPSULES(900 MG) BY MOUTH TWICE DAILY 120 capsule 11  . HUMALOG MIX 75/25 KWIKPEN (75-25) 100 UNIT/ML Kwikpen Inject 25 Units into the skin 2 (two) times daily. Before breakfast & before supper Except when taking Chemo take 32 units  For three to five days    . HYDROcodone-acetaminophen (NORCO/VICODIN) 5-325 MG tablet Take 1 tablet by mouth every 6 (six) hours as needed for moderate pain. 60 tablet 0  . lidocaine-prilocaine (EMLA) cream Apply to Porta-cath  1-2 hours prior to access as directed. (Patient taking differently: Apply 1 application topically daily as needed (port access).) 30 g 9  . nadolol (CORGARD) 20 MG tablet Take 1 tablet (20 mg total) by mouth daily. (Patient taking differently: Take 20 mg by mouth at bedtime.) 90 tablet 3  . ondansetron (ZOFRAN) 8 MG tablet Take 1 tablet (8 mg total) by mouth every 8 (eight) hours as needed. (Patient taking differently: Take 8 mg by mouth every 8 (eight) hours as needed for nausea.) 60 tablet 1  . pantoprazole (PROTONIX) 40 MG tablet Take 1 tablet (40 mg total) by mouth daily.    . prochlorperazine (COMPAZINE) 10 MG tablet Take 1 tablet (10 mg total) by mouth every 6 (six) hours as needed (Nausea or vomiting). 30 tablet 1  . rotigotine (NEUPRO) 4 MG/24HR APPLY 1 PATCH EXTERNALLY TO THE SKIN DAILY 30 patch 2   No current facility-administered medications for this visit.    PHYSICAL EXAMINATION: ECOG PERFORMANCE STATUS: 1 - Symptomatic but completely ambulatory  Vitals:   07/11/20 0845  BP: 140/74  Pulse: 69  Resp: 18  SpO2: 100%   Filed Weights   07/11/20 0845  Weight: 196 lb 6.4 oz (89.1 kg)    GENERAL:alert, no distress and comfortable NEURO: alert & oriented x 3 with fluent speech, no focal motor/sensory deficits  LABORATORY DATA:  I have reviewed the data as listed    Component Value Date/Time   NA 142 05/22/2020 0748   NA 142 02/06/2017 0742   K 4.2 05/22/2020 0748   K 3.9 02/06/2017 0742   CL 107 05/22/2020 0748   CO2  24 05/22/2020 0748   CO2 22 02/06/2017 0742   GLUCOSE 129 (H) 05/22/2020 0748   GLUCOSE 156 (H) 02/06/2017 0742   BUN 22 05/22/2020 0748   BUN 13.5 02/06/2017 0742   CREATININE 0.90 05/22/2020 0748   CREATININE 0.85 10/26/2019 0722   CREATININE 0.8 02/06/2017 0932  CALCIUM 8.8 (L) 05/22/2020 0748   CALCIUM 8.5 02/06/2017 0742   PROT 7.6 05/22/2020 0748   PROT 6.9 02/06/2017 0742   ALBUMIN 3.9 05/22/2020 0748   ALBUMIN 3.7 02/06/2017 0742   AST 25 05/22/2020 0748   AST 24 10/26/2019 0722   AST 47 (H) 02/06/2017 0742   ALT 29 05/22/2020 0748   ALT 17 10/26/2019 0722   ALT 54 02/06/2017 0742   ALKPHOS 123 05/22/2020 0748   ALKPHOS 130 02/06/2017 0742   BILITOT 0.7 05/22/2020 0748   BILITOT 0.7 10/26/2019 0722   BILITOT 0.65 02/06/2017 0742   GFRNONAA >60 05/22/2020 0748   GFRNONAA >60 10/26/2019 0722   GFRAA >60 11/09/2019 0953   GFRAA >60 10/26/2019 0722    No results found for: SPEP, UPEP  Lab Results  Component Value Date   WBC 4.0 05/22/2020   NEUTROABS 2.5 05/22/2020   HGB 12.5 05/22/2020   HCT 37.8 05/22/2020   MCV 92.0 05/22/2020   PLT 82 (L) 05/22/2020      Chemistry      Component Value Date/Time   NA 142 05/22/2020 0748   NA 142 02/06/2017 0742   K 4.2 05/22/2020 0748   K 3.9 02/06/2017 0742   CL 107 05/22/2020 0748   CO2 24 05/22/2020 0748   CO2 22 02/06/2017 0742   BUN 22 05/22/2020 0748   BUN 13.5 02/06/2017 0742   CREATININE 0.90 05/22/2020 0748   CREATININE 0.85 10/26/2019 0722   CREATININE 0.8 02/06/2017 0742      Component Value Date/Time   CALCIUM 8.8 (L) 05/22/2020 0748   CALCIUM 8.5 02/06/2017 0742   ALKPHOS 123 05/22/2020 0748   ALKPHOS 130 02/06/2017 0742   AST 25 05/22/2020 0748   AST 24 10/26/2019 0722   AST 47 (H) 02/06/2017 0742   ALT 29 05/22/2020 0748   ALT 17 10/26/2019 0722   ALT 54 02/06/2017 0742   BILITOT 0.7 05/22/2020 0748   BILITOT 0.7 10/26/2019 0722   BILITOT 0.65 02/06/2017 5300

## 2020-07-11 NOTE — Assessment & Plan Note (Signed)
She has chronic back pain for many years Her last bone density scan from 2 years ago show mild osteopenia She is continue gabapentin I will refill her pain medicine next week

## 2020-07-11 NOTE — Assessment & Plan Note (Signed)
We have chronic extensive discussions about the importance of weight loss and dietary modification She has not lost any weight since last time I saw her I warned her about risk of uncontrolled diabetes if we were to put her on chemotherapy in the future I am hopeful she can lose some weight and get her diabetes under control by her next visit

## 2020-07-11 NOTE — Assessment & Plan Note (Signed)
She has completed radiation therapy today She tolerated radiation very well with minimal side effects Her chronic back pain is stable She has no new abdominal symptoms such as nausea, bloating or changes in bowel habits I will schedule port flush next week and repeat imaging study on August 1 She is in agreement to proceed

## 2020-07-17 ENCOUNTER — Inpatient Hospital Stay: Payer: Medicare Other | Attending: Hematology and Oncology

## 2020-07-17 ENCOUNTER — Other Ambulatory Visit: Payer: Self-pay | Admitting: Hematology and Oncology

## 2020-07-17 ENCOUNTER — Other Ambulatory Visit: Payer: Self-pay

## 2020-07-17 DIAGNOSIS — C482 Malignant neoplasm of peritoneum, unspecified: Secondary | ICD-10-CM

## 2020-07-17 DIAGNOSIS — Z452 Encounter for adjustment and management of vascular access device: Secondary | ICD-10-CM | POA: Diagnosis present

## 2020-07-17 DIAGNOSIS — C569 Malignant neoplasm of unspecified ovary: Secondary | ICD-10-CM

## 2020-07-17 MED ORDER — HEPARIN SOD (PORK) LOCK FLUSH 100 UNIT/ML IV SOLN
500.0000 [IU] | Freq: Once | INTRAVENOUS | Status: AC
  Administered 2020-07-17: 500 [IU]
  Filled 2020-07-17: qty 5

## 2020-07-17 MED ORDER — SODIUM CHLORIDE 0.9% FLUSH
10.0000 mL | Freq: Once | INTRAVENOUS | Status: AC
  Administered 2020-07-17: 10 mL
  Filled 2020-07-17: qty 10

## 2020-07-17 MED ORDER — HYDROCODONE-ACETAMINOPHEN 5-325 MG PO TABS: 1.0000 | ORAL_TABLET | Freq: Four times a day (QID) | ORAL | 0 refills | Status: DC | PRN

## 2020-08-02 ENCOUNTER — Encounter: Payer: Self-pay | Admitting: Radiation Oncology

## 2020-08-09 NOTE — Progress Notes (Signed)
Radiation Oncology         ([phone]) [phone] ________________________________  Name: Olivia Benson MRN: [id number]  Date: 08/10/2020  DOB: [birth year]  Follow-Up Visit Note  CC: Jolinda Croak, MD  [name], MD    ICD-10-CM   1. 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 Diagnoses of Disseminated malignant neoplasm of ovary, unspecified laterality ([place]) and Peritoneal carcinomatosis ([place]) were also pertinent to this visit.  Primary peritoneal carcinomatosis  Interval Since Last Radiation:  1 month Intent: Palliative  Radiation Treatment Dates: 06/27/2020 through 07/11/2020 Site Technique Total Dose (Gy) Dose per Fx (Gy) Completed Fx Beam Energies  Internal Iliac Nodes: Pelvis 3D 30/30 3 10/10 6X, 15X   Narrative:  The patient returns today for routine follow-up. The patient was noted to have tolerated radiation treatment very well.  The patient  has had no recent imaging, labs or visits relevant to her diagnosis since she was last seen.         She tolerated her radiation therapy well.  She has had some nausea recently since to change in her insulin regimen.  She denies any pain within the  Pelvis area or problems with diarrhea since completion of her treatment.              Allergies:  is allergic to carboplatin, shellfish allergy, vancomycin, benadryl [diphenhydramine hcl], and penicillins.  Meds: Current Outpatient Medications  Medication Sig Dispense Refill   calcium elemental as carbonate (BARIATRIC TUMS ULTRA) 400 MG chewable tablet Chew 1 tablet by mouth 2 (two) times daily.     Cholecalciferol (VITAMIN D) 50 MCG (2000 UT) tablet Take 2,000 Units by mouth at bedtime.     gabapentin (NEURONTIN) 300 MG capsule TAKE 3 CAPSULES(900 MG) BY MOUTH TWICE DAILY 120 capsule 11    HYDROcodone-acetaminophen (NORCO/VICODIN) 5-325 MG tablet Take 1 tablet by mouth every 6 (six) hours as needed for moderate pain. 60 tablet 0   Insulin Degludec 100 UNIT/ML SOLN Inject 30 Units into the skin daily.     lidocaine-prilocaine (EMLA) cream Apply to Porta-cath  1-2 hours prior to access as directed. (Patient taking differently: Apply 1 application topically daily as needed (port access).) 30 g 9   ondansetron (ZOFRAN) 8 MG tablet Take 1 tablet (8 mg total) by mouth every 8 (eight) hours as needed. (Patient taking differently: Take 8 mg by mouth every 8 (eight) hours as needed for nausea.) 60 tablet 1   pantoprazole (PROTONIX) 40 MG tablet Take 1 tablet (40 mg total) by mouth daily.     prochlorperazine (COMPAZINE) 10 MG tablet Take 1 tablet (10 mg total) by mouth every 6 (six) hours as needed (Nausea or vomiting). 30 tablet 1   rotigotine (NEUPRO) 4 MG/24HR APPLY 1 PATCH EXTERNALLY TO THE SKIN DAILY 30 patch 2   SEMAGLUTIDE,0.25 OR 0.5MG/DOS, [place] Inject 0.25 mg into the skin once a week.     dexamethasone (DECADRON) 4 MG tablet Start the day before Taxotere, take 1 tablet in the morning, and then 1 tablet daily after chemo for 2 days. Pls dispense 30 tabs (Patient not taking: Reported on 08/10/2020) 30 tablet 1   nadolol (CORGARD) 20 MG tablet Take 1 tablet (20 mg total) by mouth daily. (Patient taking differently: Take 20 mg by mouth at bedtime.) 90 tablet  3   No current facility-administered medications for this encounter.    Physical Findings: The patient is in no acute distress. Patient is alert and oriented.  height is 5' 1.5" (1.562 m) and weight is 195 lb 8 oz (88.7 kg). Her temperature is 98.2 F (36.8 C). Her blood pressure is 117/60 and her pulse is 78. Her respiration is 20 and oxygen saturation is 98%. .  No significant changes. Lungs are clear to auscultation bilaterally. Heart has regular rate and rhythm. No palpable cervical, supraclavicular, or axillary adenopathy. Abdomen  soft, non-tender, normal bowel sounds.   Lab Findings: Lab Results  Component Value Date   WBC 4.0 05/22/2020   HGB 12.5 05/22/2020   HCT 37.8 05/22/2020   MCV 92.0 05/22/2020   PLT 82 (L) 05/22/2020    Radiographic Findings: No results found.  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 Diagnoses of Disseminated malignant neoplasm of ovary, unspecified laterality ([place]) and Peritoneal carcinomatosis ([place]) were also pertinent to this visit.  Primary peritoneal carcinomatosis  The patient tolerated her radiation therapy well.  Plan: She will follow-up in early August with a CT scan and then see Dr. Alvy Bimler soon afterward.  As needed follow-up in radiation oncology in light of her close follow-up with medical oncology.    ____________________________________  Blair Promise, PhD, MD   This document serves as a record of services personally performed by Gery Pray, MD. It was created on his behalf by Roney Mans, a trained medical scribe. The creation of this record is based on the scribe's personal observations and the provider's statements to them. This document has been checked and approved by the attending provider.

## 2020-08-10 ENCOUNTER — Encounter: Payer: Self-pay | Admitting: Radiation Oncology

## 2020-08-10 ENCOUNTER — Ambulatory Visit
Admission: RE | Admit: 2020-08-10 | Discharge: 2020-08-10 | Disposition: A | Discharge status: Home / Self Care | Payer: Medicare Other | Source: Ambulatory Visit | Attending: Radiation Oncology | Admitting: Radiation Oncology

## 2020-08-10 ENCOUNTER — Other Ambulatory Visit: Payer: Self-pay

## 2020-08-10 VITALS — BP 117/60 | HR 78 | Temp 98.2°F | Resp 20 | Ht 61.5 in | Wt 195.5 lb

## 2020-08-10 DIAGNOSIS — Z923 Personal history of irradiation: Secondary | ICD-10-CM | POA: Diagnosis not present

## 2020-08-10 DIAGNOSIS — C482 Malignant neoplasm of peritoneum, unspecified: Secondary | ICD-10-CM | POA: Diagnosis not present

## 2020-08-10 DIAGNOSIS — C569 Malignant neoplasm of unspecified ovary: Secondary | ICD-10-CM

## 2020-08-10 HISTORY — DX: Personal history of irradiation: Z92.3

## 2020-08-10 NOTE — Progress Notes (Signed)
Olivia Benson is here today for follow up post radiation to the pelvic.  They completed their radiation on: 07/11/20  Does the patient complain of any of the following:  Pain:Patient denies pain to pelvic area.  Abdominal bloating: no Diarrhea/Constipation: no Nausea/Vomiting: nausea due to change in insulin. Vaginal Discharge: no Blood in Urine or Stool: no Urinary Issues (dysuria/incomplete emptying/ incontinence/ increased frequency/urgency): no Post radiation skin changes: no   Additional comments if applicable:   Vitals:   08/10/20 1519  BP: 117/60  Pulse: 78  Resp: 20  Temp: 98.2 F (36.8 C)  SpO2: 98%  Weight: 195 lb 8 oz (88.7 kg)  Height: 5' 1.5" (1.562 m)

## 2020-08-11 ENCOUNTER — Encounter: Payer: Self-pay | Admitting: Hematology and Oncology

## 2020-08-18 ENCOUNTER — Ambulatory Visit (INDEPENDENT_AMBULATORY_CARE_PROVIDER_SITE_OTHER): Payer: Medicare Other

## 2020-08-18 ENCOUNTER — Ambulatory Visit (INDEPENDENT_AMBULATORY_CARE_PROVIDER_SITE_OTHER): Payer: Medicare Other | Admitting: Orthopaedic Surgery

## 2020-08-18 DIAGNOSIS — M545 Low back pain, unspecified: Secondary | ICD-10-CM | POA: Diagnosis not present

## 2020-08-18 NOTE — Progress Notes (Signed)
Office Visit Note   Patient: Olivia Benson           Date of Birth: [birth year]           MRN: [id number] Visit Date: 08/18/2020              Requested by: Gregor Hams, [place],  [place] [address] PCP: Jolinda Croak, MD   Assessment & Plan: Visit Diagnoses:  1. Low back pain, unspecified back pain laterality, unspecified chronicity, unspecified whether sciatica present     Plan: Based on chronicity of her back pain and history of metastatic cancer we will need to obtain an MRI of the lumbar spine to evaluate for metastatic disease and pathologic fracture.  We will have the patient follow-up after the MRI.  In terms of groin pain I offered a cortisone injection to see if this will help with the pain but she would like to wait until after the MRI.    Follow-Up Instructions: Return for After MRI.   Orders:  Orders Placed This Encounter  Procedures  . XR Lumbar Spine 2-3 Views  . MR Lumbar Spine w/o contrast   No orders of the defined types were placed in this encounter.     Procedures: No procedures performed   Clinical Data: No additional findings.   Subjective: Chief Complaint  Patient presents with  . Lower Back - Pain    Olivia Benson is a 75 year old female well-known to me who comes in for chronic low back pain.  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  She feels some occasional groin pain and feels like the leg wants to give out.  He she feels radiating pain down both legs as well as body aches and neuropathy.  She takes Tylenol for the pain.  She feels that the pain in her back has gotten worse.  Denies any constitutional symptoms or bowel bladder changes.   Review of Systems  Constitutional: Negative.   HENT: Negative.    Eyes: Negative.   Respiratory: Negative.    Cardiovascular: Negative.   Endocrine: Negative.   Musculoskeletal: Negative.   Neurological: Negative.   Hematological: Negative.    Psychiatric/Behavioral: Negative.    All other systems reviewed and are negative.   Objective: Vital Signs: There were no vitals taken for this visit.  Physical Exam Vitals and nursing note reviewed.  Constitutional:      Appearance: She is well-developed.  Pulmonary:     Effort: Pulmonary effort is normal.  Skin:    General: Skin is warm.     Capillary Refill: Capillary refill takes less than 2 seconds.  Neurological:     Mental Status: She is alert and oriented to person, place, and time.  Psychiatric:        Behavior: Behavior normal.        Thought Content: Thought content normal.        Judgment: Judgment normal.    Ortho Exam Lumbar spine shows tenderness throughout.  Range of motion is slightly limited secondary to pain and guarding.  No focal motor or sensory deficits of the lower extremities. Specialty Comments:  No specialty comments available.  Imaging: XR Lumbar Spine 2-3 Views  Result Date: 08/18/2020 Superior endplate changes of L4.  Metastatic disease cannot be ruled out.    PMFS History: Patient Active Problem List   Diagnosis Date Noted  . Chronic back pain greater than 3 months duration 05/23/2020  . Tennis Must  Quervain's tenosynovitis, left 04/18/2020  . Bone lesion 02/23/2020  . 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 Acute GI bleeding 09/17/2019  . Bilateral edema of lower extremity 04/14/2019  . Preventive measure 03/03/2019  . Syncope, vasovagal 01/04/2019  . Phlebitis after infusion 01/04/2019  . Acute lower UTI 01/04/2019  . Peritoneal carcinomatosis ([place]) 01/04/2019  . AKI (acute kidney injury) ([place]) 12/31/2018  . SAH (subarachnoid hemorrhage) ([place]) 12/31/2018  . Antineoplastic chemotherapy induced pancytopenia ([place]) 12/31/2018  . Severe sepsis with septic shock ([place]) 12/31/2018  . Diarrhea 12/31/2018  . Redness and swelling of upper arm, right 12/31/2018  . Hypocalcemia 12/31/2018  . Coagulopathy ([place])   . Mucositis due to  antineoplastic therapy   . Esophageal varices ([place]) 03/23/2018  . Chronic diastolic CHF (congestive heart failure) ([place]) 03/09/2018  . Fall 03/08/2018  . Displaced fracture of distal end of right fibula 03/08/2018  . GIB (gastrointestinal bleeding) 02/09/2018  . Obesity due to excess calories 03/20/2017  . Cardiomyopathy ([place]) 01/24/2017  . Sinus congestion 01/24/2017  . 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 Goals of care, counseling/discussion 09/03/2016  . Thrombocytopenia ([place]) 04/15/2016  . Vasomotor instability 01/17/2016  . Osteopenia determined by x-ray 12/22/2015  . Encounter for antineoplastic chemotherapy 12/09/2015  . Chemotherapy-induced neuropathy ([place]) 10/01/2015  . Elevated tumor markers 10/01/2015  . Obesity (BMI 35.0-39.9 without comorbidity) 05/31/2015  . Port catheter in place 03/29/2015  . Iron deficiency anemia 03/05/2015  . Low back pain 03/05/2015  . Neuropathic pain of foot 11/21/2014  . Anemia associated with chemotherapy 07/03/2014  . Diabetes mellitus without complication ([place]) 67/01/4579  . Portacath in place 05/10/2014  . Leukopenia due to antineoplastic chemotherapy ([place]) 05/10/2014  . Splenomegaly 05/10/2014  . Primary peritoneal carcinomatosis ([place]) 05/08/2014  . Malignant neoplasm of peritoneum ([place]) 05/08/2014  . Genetic testing 03/16/2014  . Neutropenic fever ([place]) 01/27/2014  . Pancytopenia, acquired ([place]) 01/27/2014  . Fever and neutropenia ([place]) 01/27/2014  . Asthma 01/27/2014  . Malignant ascites 01/25/2014  . Chemotherapy induced neutropenia ([place]) 01/25/2014  . Chemotherapy-induced thrombocytopenia 01/25/2014  . RLS (restless legs syndrome) 01/25/2014  . Liver cirrhosis secondary to NASH ([place]) 01/02/2014  . Nonalcoholic steatohepatitis (NASH) 01/02/2014  . 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 Diabetes mellitus with diabetic neuropathy, with long-term current use of insulin ([place]) 12/03/2013  . Ascites  11/15/2013  . Elevated cancer antigen 125 (CA-125) 11/15/2013  . Cough variant asthma vs UACS 09/02/2013  . Essential hypertension 09/02/2013  . Dyspnea 10/22/2012  . Fatty liver disease, nonalcoholic 99/83/3825  . S/P cardiac cath 11/06/2010  . Personal history of surgery to heart and great vessels, presenting hazards to health 11/06/2010   Past Medical History:  Diagnosis Date  . Asthma   . CHF (congestive heart failure) ([place])   . Cirrhosis ([place])   . Complication of anesthesia    slow to wake up / n & v  . Diabetes mellitus   . Elevated LFTs   . Family history of adverse reaction to anesthesia    multiple family members have had problems such as slow to wake / "flat lined" /  ventilator    . Frequency of urination   . Glaucoma, narrow-angle   . History of radiation therapy    Pelvis- 06/27/20-07/11/20 Dr. Gery Pray  . History of skin cancer   . Hypertension   . Non-ST elevated myocardial infarction (non-STEMI) ([place]) 10/2010   Mild elevation in troponin and CK MB but no evidence of  coronary disease at time of cath; per patient " i had undiagnosed heart event but not a heart attack"   . Obesity   . Ovarian cancer ([place]) 11/22/2013   Disseminated ovarian cancer; 09-25-2018   "my cancer is back and im restarting chemo on tuesday 09-25-2018]  . Overactive bladder   . PONV (postoperative nausea and vomiting)    no problems with feb  egd   . Portal hypertension ([place])   . Restless leg syndrome   . Tingling    hands and feet    Family History  Problem Relation Age of Onset  . Asthma Mother   . Diabetes Mother   . Hypertension Mother   . Allergies Mother   . Rheum arthritis Mother   . Bladder Cancer Father 58       former heavy smoker  . Hypertension Father   . Stroke Father   . Breast cancer Sister 38  . Diabetes Sister        x 3  . Allergies Sister   . Asthma Sister        x 2   . Breast cancer Maternal Aunt 85  . Breast cancer Cousin 57       (maternal) bilateral  cancer  . Aneurysm Son 55  . Diabetes Brother   . Allergies Brother   . Asthma Brother     Past Surgical History:  Procedure Laterality Date  . ABDOMINAL ULTRASOUND  Sept 2012   No gallbladder disease, + fatty liver  . CARDIAC CATHETERIZATION  10/25/2010   EF 60%; Normal coronaries  . COLONOSCOPY  10/18/2005  . COLONOSCOPY WITH PROPOFOL N/A 09/28/2018   Procedure: COLONOSCOPY WITH PROPOFOL;  Surgeon: Ronnette Juniper, MD;  [facility];  Service: Gastroenterology;  Laterality: N/A;  . COSMETIC SURGERY  2003  . DENTAL SURGERY    . ESOPHAGEAL BANDING  02/10/2018   Procedure: ESOPHAGEAL BANDING;  Surgeon: Ronnette Juniper, MD;  [facility];  Service: Gastroenterology;;  . ESOPHAGEAL BANDING N/A 03/23/2018   Procedure: ESOPHAGEAL BANDING;  Surgeon: Ronnette Juniper, MD;  [facility];  Service: Gastroenterology;  Laterality: N/A;  . ESOPHAGEAL BANDING N/A 09/28/2018   Procedure: ESOPHAGEAL BANDING;  Surgeon: Ronnette Juniper, MD;  [facility];  Service: Gastroenterology;  Laterality: N/A;  . ESOPHAGEAL BANDING  03/01/2019   Procedure: ESOPHAGEAL BANDING;  Surgeon: Ronnette Juniper, MD;  [facility];  Service: Gastroenterology;;  . ESOPHAGOGASTRODUODENOSCOPY N/A 03/23/2018   Procedure: ESOPHAGOGASTRODUODENOSCOPY (EGD);  Surgeon: Ronnette Juniper, MD;  [facility];  Service: Gastroenterology;  Laterality: N/A;  . ESOPHAGOGASTRODUODENOSCOPY N/A 09/17/2019   Procedure: ESOPHAGOGASTRODUODENOSCOPY (EGD);  Surgeon: Arta Silence, MD;  [facility];  Service: Endoscopy;  Laterality: N/A;  . ESOPHAGOGASTRODUODENOSCOPY (EGD) WITH PROPOFOL N/A 02/10/2018   Procedure: ESOPHAGOGASTRODUODENOSCOPY (EGD) WITH PROPOFOL;  Surgeon: Ronnette Juniper, MD;  [facility];  Service: Gastroenterology;  Laterality: N/A;  . ESOPHAGOGASTRODUODENOSCOPY (EGD) WITH PROPOFOL N/A 09/28/2018   Procedure: ESOPHAGOGASTRODUODENOSCOPY (EGD) WITH PROPOFOL;  Surgeon: Ronnette Juniper, MD;   [facility];  Service: Gastroenterology;  Laterality: N/A;  . ESOPHAGOGASTRODUODENOSCOPY (EGD) WITH PROPOFOL N/A 03/01/2019   Procedure: ESOPHAGOGASTRODUODENOSCOPY (EGD) WITH PROPOFOL with banding;  Surgeon: Ronnette Juniper, MD;  [facility];  Service: Gastroenterology;  Laterality: N/A;  . ESOPHAGOGASTRODUODENOSCOPY (EGD) WITH PROPOFOL N/A 05/24/2019   Procedure: ESOPHAGOGASTRODUODENOSCOPY (EGD) WITH PROPOFOL;  Surgeon: Ronnette Juniper, MD;  [facility];  Service: Gastroenterology;  Laterality: N/A;  . ESOPHAGOGASTRODUODENOSCOPY (EGD) WITH PROPOFOL N/A 06/13/2020   Procedure: ESOPHAGOGASTRODUODENOSCOPY (  EGD) WITH PROPOFOL;  Surgeon: Ronnette Juniper, MD;  [facility];  Service: Gastroenterology;  Laterality: N/A;  . EYE SURGERY Bilateral 09/19/14 R eye, 08/2014 L eye   open angles in both eyes d/t narrow angle glaucoma  . GASTRIC VARICES BANDING N/A 05/24/2019   Procedure: GASTRIC VARICES BANDING;  Surgeon: Ronnette Juniper, MD;  [facility];  Service: Gastroenterology;  Laterality: N/A;  . GASTRIC VARICES BANDING N/A 06/13/2020   Procedure: GASTRIC VARICES BANDING;  Surgeon: Ronnette Juniper, MD;  [facility];  Service: Gastroenterology;  Laterality: N/A;  . LAPAROTOMY N/A 03/29/2014   Procedure: EXPLORATORY LAPAROTOMY;  Surgeon: Everitt Amber, MD;  [facility];  Service: Gynecology;  Laterality: N/A;  . ORIF ANKLE FRACTURE Right 03/09/2018   Procedure: OPEN REDUCTION INTERNAL FIXATION (ORIF) RIGHT LATERAL MALLEOLUS;  Surgeon: Leandrew Koyanagi, MD;  Location: [facility];  Service: Orthopedics;  Laterality: Right;  . PARTIAL HYSTERECTOMY  1988  . POLYPECTOMY  09/28/2018   Procedure: POLYPECTOMY;  Surgeon: Ronnette Juniper, MD;  [facility];  Service: Gastroenterology;;  . SALPINGOOPHORECTOMY Bilateral 03/29/2014   Procedure: BILATERAL SALPINGO OOPHORECTOMY, OMENTECTOMY, DEBULKING;  Surgeon: Everitt Amber, MD;  [facility];  Service: Gynecology;  Laterality:  Bilateral;  . TONSILLECTOMY  1971  . TUBAL LIGATION    . [facility]  11/24/2009   Social History   Occupational History  . Occupation: Retired  Tobacco Use  . Smoking status: Former    Packs/day: 1.00    Years: 1.00    Pack years: 1.00    Types: Cigarettes    Quit date: 02/11/1982    Years since quitting: 38.5  . Smokeless tobacco: Never  Vaping Use  . Vaping Use: Never used  Substance and Sexual Activity  . Alcohol use: No    Alcohol/week: 0.0 standard drinks  . Drug use: No  . Sexual activity: Not on file

## 2020-08-26 ENCOUNTER — Ambulatory Visit
Admission: RE | Admit: 2020-08-26 | Discharge: 2020-08-26 | Disposition: A | Discharge status: Home / Self Care | Payer: Medicare Other | Source: Ambulatory Visit | Attending: Orthopaedic Surgery | Admitting: Orthopaedic Surgery

## 2020-08-26 ENCOUNTER — Other Ambulatory Visit: Payer: Self-pay

## 2020-08-26 DIAGNOSIS — M545 Low back pain, unspecified: Secondary | ICD-10-CM

## 2020-08-29 ENCOUNTER — Ambulatory Visit (INDEPENDENT_AMBULATORY_CARE_PROVIDER_SITE_OTHER): Payer: Medicare Other | Admitting: Orthopaedic Surgery

## 2020-08-29 ENCOUNTER — Other Ambulatory Visit: Payer: Self-pay

## 2020-08-29 DIAGNOSIS — G8929 Other chronic pain: Secondary | ICD-10-CM | POA: Diagnosis not present

## 2020-08-29 DIAGNOSIS — M545 Low back pain, unspecified: Secondary | ICD-10-CM

## 2020-08-29 NOTE — Progress Notes (Signed)
Patient: Olivia Benson           Date of Birth: [birth year]           MRN: [id number] Visit Date: 08/29/2020 PCP: Jolinda Croak, MD   Assessment & Plan:  Chief Complaint:  Chief Complaint  Patient presents with   Lower Back - Pain   Visit Diagnoses:  1. Chronic bilateral low back pain, unspecified whether sciatica present     Plan: Olivia Benson returns today for MRI review of the lumbar spine.  MRI was done to evaluate for possible malignancy.  This was negative but did show widespread degenerative changes and foraminal stenosis.  She has chronic back pain and occasional radiculopathy.  She has undergone home exercise program and takes Tylenol on a daily basis for the pain.  Given lack of relief from conservative treatments at this point I would like to make a referral to either Dr. Ernestina Patches or [facility] imaging for lumbar spine ESI.  We will see her back as needed.  Follow-Up Instructions: Return if symptoms worsen or fail to improve.   Orders:  No orders of the defined types were placed in this encounter.  No orders of the defined types were placed in this encounter.   Imaging: No results found.  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   Acute GI bleeding 09/17/2019   Bilateral edema of lower extremity 04/14/2019   Preventive measure 03/03/2019   Syncope, vasovagal 01/04/2019   Phlebitis after infusion 01/04/2019   Acute lower UTI 01/04/2019   Peritoneal carcinomatosis ([place]) 01/04/2019   AKI (acute kidney injury) ([place]) 12/31/2018   SAH (subarachnoid hemorrhage) ([place]) 12/31/2018   Antineoplastic chemotherapy induced pancytopenia ([place]) 12/31/2018   Severe sepsis with septic shock ([place]) 12/31/2018   Diarrhea 12/31/2018   Redness and swelling of upper arm,  right 12/31/2018   Hypocalcemia 12/31/2018   Coagulopathy (HCC)    Mucositis due to antineoplastic therapy    Esophageal varices ([place]) 03/23/2018   Chronic diastolic CHF (congestive heart failure) ([place]) 03/09/2018   Fall 03/08/2018   Displaced fracture of distal end of right fibula 03/08/2018   GIB (gastrointestinal bleeding) 02/09/2018   Obesity due to excess calories 03/20/2017   Cardiomyopathy ([place]) 01/24/2017   Sinus congestion 01/24/2017   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/03/2016   Goals of care, counseling/discussion 09/03/2016   Thrombocytopenia ([place]) 04/15/2016   Vasomotor instability 01/17/2016   Osteopenia determined by x-ray 12/22/2015   Encounter for antineoplastic chemotherapy 12/09/2015   Chemotherapy-induced neuropathy ([place]) 10/01/2015   Elevated tumor markers 10/01/2015   Obesity (BMI 35.0-39.9 without comorbidity) 05/31/2015   Port catheter in place 03/29/2015   Iron deficiency anemia 03/05/2015   Low back pain 03/05/2015   Neuropathic pain of foot 11/21/2014   Anemia associated with chemotherapy 07/03/2014   Diabetes mellitus without complication ([place]) 93/81/8299   Portacath in place 05/10/2014   Leukopenia due to antineoplastic chemotherapy ([place]) 05/10/2014   Splenomegaly 05/10/2014   Primary peritoneal carcinomatosis ([place]) 05/08/2014   Malignant neoplasm of peritoneum ([place]) 05/08/2014   Genetic testing 03/16/2014   Neutropenic fever ([place]) 01/27/2014   Pancytopenia, acquired ([place]) 01/27/2014   Fever and neutropenia ([place]) 01/27/2014   Asthma 01/27/2014   Malignant ascites 01/25/2014   Chemotherapy induced neutropenia ([place]) 01/25/2014  Chemotherapy-induced thrombocytopenia 01/25/2014   RLS (restless legs syndrome) 01/25/2014   Liver cirrhosis secondary to NASH ([place]) 27/04/5007   Nonalcoholic steatohepatitis (NASH) 01/02/2014   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   Diabetes mellitus with diabetic neuropathy, with long-term current use  of insulin (HCC) 12/03/2013   Ascites 11/15/2013   Elevated cancer antigen 125 (CA-125) 11/15/2013   Cough variant asthma vs UACS 09/02/2013   Essential hypertension 09/02/2013   Dyspnea 10/22/2012   Fatty liver disease, nonalcoholic 38/18/2993   S/P cardiac cath 11/06/2010   Personal history of surgery to heart and great vessels, presenting hazards to health 11/06/2010   Past Medical History:  Diagnosis Date   Asthma    CHF (congestive heart failure) (HCC)    Cirrhosis (HCC)    Complication of anesthesia    slow to wake up / n & v   Diabetes mellitus    Elevated LFTs    Family history of adverse reaction to anesthesia    multiple family members have had problems such as slow to wake / "flat lined" /  ventilator     Frequency of urination    Glaucoma, narrow-angle    History of radiation therapy    Pelvis- 06/27/20-07/11/20 Dr. Gery Pray   History of skin cancer    Hypertension    Non-ST elevated myocardial infarction (non-STEMI) ([place]) 10/2010   Mild elevation in troponin and CK MB but no evidence of coronary disease at time of cath; per patient " i had undiagnosed heart event but not a heart attack"    Obesity    Ovarian cancer ([place]) 11/22/2013   Disseminated ovarian cancer; 09-25-2018   "my cancer is back and im restarting chemo on tuesday 09-25-2018]   Overactive bladder    PONV (postoperative nausea and vomiting)    no problems with feb  egd    Portal hypertension (HCC)    Restless leg syndrome    Tingling    hands and feet    Family History  Problem Relation Age of Onset   Asthma Mother    Diabetes Mother    Hypertension Mother    Allergies Mother    Rheum arthritis Mother    Bladder Cancer Father 46       former heavy smoker   Hypertension Father    Stroke Father    Breast cancer Sister 68   Diabetes Sister        x 3   Allergies Sister    Asthma Sister        x 2    Breast cancer Maternal Aunt 85   Breast cancer Cousin 51       (maternal) bilateral  cancer   Aneurysm Son 40   Diabetes Brother    Allergies Brother    Asthma Brother     Past Surgical History:  Procedure Laterality Date   ABDOMINAL ULTRASOUND  Sept 2012   No gallbladder disease, + fatty liver   CARDIAC CATHETERIZATION  10/25/2010   EF 60%; Normal coronaries   COLONOSCOPY  10/18/2005   COLONOSCOPY WITH PROPOFOL N/A 09/28/2018   Procedure: COLONOSCOPY WITH PROPOFOL;  Surgeon: Ronnette Juniper, MD;  [facility];  Service: Gastroenterology;  Laterality: N/A;   COSMETIC SURGERY  2003   DENTAL SURGERY     ESOPHAGEAL BANDING  02/10/2018   Procedure: ESOPHAGEAL BANDING;  Surgeon: Ronnette Juniper, MD;  [facility];  Service: Gastroenterology;;   ESOPHAGEAL BANDING N/A 03/23/2018   Procedure: ESOPHAGEAL BANDING;  Surgeon: Therisa Doyne,  Megan Salon, MD;  [facility];  Service: Gastroenterology;  Laterality: N/A;   ESOPHAGEAL BANDING N/A 09/28/2018   Procedure: ESOPHAGEAL BANDING;  Surgeon: Ronnette Juniper, MD;  [facility];  Service: Gastroenterology;  Laterality: N/A;   ESOPHAGEAL BANDING  03/01/2019   Procedure: ESOPHAGEAL BANDING;  Surgeon: Ronnette Juniper, MD;  [facility];  Service: Gastroenterology;;   ESOPHAGOGASTRODUODENOSCOPY N/A 03/23/2018   Procedure: ESOPHAGOGASTRODUODENOSCOPY (EGD);  Surgeon: Ronnette Juniper, MD;  [facility];  Service: Gastroenterology;  Laterality: N/A;   ESOPHAGOGASTRODUODENOSCOPY N/A 09/17/2019   Procedure: ESOPHAGOGASTRODUODENOSCOPY (EGD);  Surgeon: Arta Silence, MD;  [facility];  Service: Endoscopy;  Laterality: N/A;   ESOPHAGOGASTRODUODENOSCOPY (EGD) WITH PROPOFOL N/A 02/10/2018   Procedure: ESOPHAGOGASTRODUODENOSCOPY (EGD) WITH PROPOFOL;  Surgeon: Ronnette Juniper, MD;  [facility];  Service: Gastroenterology;  Laterality: N/A;   ESOPHAGOGASTRODUODENOSCOPY (EGD) WITH PROPOFOL N/A 09/28/2018   Procedure: ESOPHAGOGASTRODUODENOSCOPY (EGD) WITH PROPOFOL;  Surgeon: Ronnette Juniper, MD;  [facility];  Service: Gastroenterology;  Laterality: N/A;   ESOPHAGOGASTRODUODENOSCOPY (EGD) WITH PROPOFOL N/A 03/01/2019   Procedure: ESOPHAGOGASTRODUODENOSCOPY (EGD) WITH PROPOFOL with banding;  Surgeon: Ronnette Juniper, MD;  [facility];  Service: Gastroenterology;  Laterality: N/A;   ESOPHAGOGASTRODUODENOSCOPY (EGD) WITH PROPOFOL N/A 05/24/2019   Procedure: ESOPHAGOGASTRODUODENOSCOPY (EGD) WITH PROPOFOL;  Surgeon: Ronnette Juniper, MD;  [facility];  Service: Gastroenterology;  Laterality: N/A;   ESOPHAGOGASTRODUODENOSCOPY (EGD) WITH PROPOFOL N/A 06/13/2020   Procedure: ESOPHAGOGASTRODUODENOSCOPY (EGD) WITH PROPOFOL;  Surgeon: Ronnette Juniper, MD;  [facility];  Service: Gastroenterology;  Laterality: N/A;   EYE SURGERY Bilateral 09/19/14 R eye, 08/2014 L eye   open angles in both eyes d/t narrow angle glaucoma   GASTRIC VARICES BANDING N/A 05/24/2019   Procedure: GASTRIC VARICES BANDING;  Surgeon: Ronnette Juniper, MD;  [facility];  Service: Gastroenterology;  Laterality: N/A;   GASTRIC VARICES BANDING N/A 06/13/2020   Procedure: GASTRIC VARICES BANDING;  Surgeon: Ronnette Juniper, MD;  [facility];  Service: Gastroenterology;  Laterality: N/A;   LAPAROTOMY N/A 03/29/2014   Procedure: EXPLORATORY LAPAROTOMY;  Surgeon: Everitt Amber, MD;  [facility];  Service: Gynecology;  Laterality: N/A;   ORIF ANKLE FRACTURE Right 03/09/2018   Procedure: OPEN REDUCTION INTERNAL FIXATION (ORIF) RIGHT LATERAL MALLEOLUS;  Surgeon: Leandrew Koyanagi, MD;  Location: [facility];  Service: Orthopedics;  Laterality: Right;   PARTIAL HYSTERECTOMY  1988   POLYPECTOMY  09/28/2018   Procedure: POLYPECTOMY;  Surgeon: Ronnette Juniper, MD;  [facility];  Service: Gastroenterology;;   SALPINGOOPHORECTOMY Bilateral 03/29/2014   Procedure: BILATERAL SALPINGO OOPHORECTOMY, OMENTECTOMY, DEBULKING;  Surgeon: Everitt Amber, MD;  [facility];  Service: Gynecology;  Laterality: Bilateral;   TONSILLECTOMY   1971   TUBAL LIGATION     [facility]  11/24/2009   Social History   Occupational History   Occupation: Retired  Tobacco Use   Smoking status: Former    Packs/day: 1.00    Years: 1.00    Pack years: 1.00    Types: Cigarettes    Quit date: 02/11/1982    Years since quitting: 38.5   Smokeless tobacco: Never  Vaping Use   Vaping Use: Never used  Substance and Sexual Activity   Alcohol use: No    Alcohol/week: 0.0 standard drinks   Drug use: No   Sexual activity: Not on file

## 2020-09-01 ENCOUNTER — Other Ambulatory Visit: Payer: Self-pay

## 2020-09-01 ENCOUNTER — Ambulatory Visit
Admission: RE | Admit: 2020-09-01 | Discharge: 2020-09-01 | Disposition: A | Discharge status: Home / Self Care | Payer: Medicare Other | Source: Ambulatory Visit | Attending: Orthopaedic Surgery | Admitting: Orthopaedic Surgery

## 2020-09-01 DIAGNOSIS — G8929 Other chronic pain: Secondary | ICD-10-CM

## 2020-09-01 DIAGNOSIS — M545 Low back pain, unspecified: Secondary | ICD-10-CM

## 2020-09-01 MED ORDER — METHYLPREDNISOLONE ACETATE 40 MG/ML INJ SUSP (RADIOLOG
80.0000 mg | Freq: Once | INTRAMUSCULAR | Status: AC
  Administered 2020-09-01: 80 mg via EPIDURAL

## 2020-09-01 MED ORDER — IOPAMIDOL (ISOVUE-M 200) INJECTION 41%
1.0000 mL | Freq: Once | INTRAMUSCULAR | Status: AC
  Administered 2020-09-01: 1 mL via EPIDURAL

## 2020-09-01 NOTE — Discharge Instructions (Signed)

## 2020-09-04 ENCOUNTER — Other Ambulatory Visit: Payer: Self-pay | Admitting: Hematology and Oncology

## 2020-09-04 DIAGNOSIS — Z7189 Other specified counseling: Secondary | ICD-10-CM

## 2020-09-04 DIAGNOSIS — C482 Malignant neoplasm of peritoneum, unspecified: Secondary | ICD-10-CM

## 2020-09-11 ENCOUNTER — Other Ambulatory Visit: Payer: Self-pay

## 2020-09-11 ENCOUNTER — Inpatient Hospital Stay: Payer: Medicare Other | Attending: Hematology and Oncology

## 2020-09-11 ENCOUNTER — Ambulatory Visit (HOSPITAL_COMMUNITY)
Admission: RE | Admit: 2020-09-11 | Discharge: 2020-09-11 | Disposition: A | Discharge status: Home / Self Care | Payer: Medicare Other | Source: Ambulatory Visit | Attending: Hematology and Oncology | Admitting: Hematology and Oncology

## 2020-09-11 DIAGNOSIS — R591 Generalized enlarged lymph nodes: Secondary | ICD-10-CM | POA: Insufficient documentation

## 2020-09-11 DIAGNOSIS — C569 Malignant neoplasm of unspecified ovary: Secondary | ICD-10-CM

## 2020-09-11 DIAGNOSIS — K7581 Nonalcoholic steatohepatitis (NASH): Secondary | ICD-10-CM | POA: Insufficient documentation

## 2020-09-11 DIAGNOSIS — D696 Thrombocytopenia, unspecified: Secondary | ICD-10-CM | POA: Insufficient documentation

## 2020-09-11 DIAGNOSIS — K5730 Diverticulosis of large intestine without perforation or abscess without bleeding: Secondary | ICD-10-CM | POA: Diagnosis not present

## 2020-09-11 DIAGNOSIS — K7460 Unspecified cirrhosis of liver: Secondary | ICD-10-CM | POA: Diagnosis not present

## 2020-09-11 DIAGNOSIS — M8580 Other specified disorders of bone density and structure, unspecified site: Secondary | ICD-10-CM | POA: Diagnosis not present

## 2020-09-11 DIAGNOSIS — Z79810 Long term (current) use of selective estrogen receptor modulators (SERMs): Secondary | ICD-10-CM | POA: Diagnosis not present

## 2020-09-11 DIAGNOSIS — C482 Malignant neoplasm of peritoneum, unspecified: Secondary | ICD-10-CM | POA: Insufficient documentation

## 2020-09-11 DIAGNOSIS — Z9221 Personal history of antineoplastic chemotherapy: Secondary | ICD-10-CM | POA: Insufficient documentation

## 2020-09-11 DIAGNOSIS — C786 Secondary malignant neoplasm of retroperitoneum and peritoneum: Secondary | ICD-10-CM

## 2020-09-11 DIAGNOSIS — D509 Iron deficiency anemia, unspecified: Secondary | ICD-10-CM

## 2020-09-11 DIAGNOSIS — M48061 Spinal stenosis, lumbar region without neurogenic claudication: Secondary | ICD-10-CM | POA: Diagnosis not present

## 2020-09-11 DIAGNOSIS — M545 Low back pain, unspecified: Secondary | ICD-10-CM | POA: Insufficient documentation

## 2020-09-11 DIAGNOSIS — E1142 Type 2 diabetes mellitus with diabetic polyneuropathy: Secondary | ICD-10-CM | POA: Diagnosis not present

## 2020-09-11 LAB — CBC WITH DIFFERENTIAL/PLATELET
Abs Immature Granulocytes: 0.01 10*3/uL (ref 0.00–0.07)
Basophils Absolute: 0 10*3/uL (ref 0.0–0.1)
Basophils Relative: 0 %
Eosinophils Absolute: 0.1 10*3/uL (ref 0.0–0.5)
Eosinophils Relative: 2 %
HCT: 40.1 % (ref 36.0–46.0)
Hemoglobin: 13.5 g/dL (ref 12.0–15.0)
Immature Granulocytes: 0 %
Lymphocytes Relative: 25 %
Lymphs Abs: 1.4 10*3/uL (ref 0.7–4.0)
MCH: 31.4 pg (ref 26.0–34.0)
MCHC: 33.7 g/dL (ref 30.0–36.0)
MCV: 93.3 fL (ref 80.0–100.0)
Monocytes Absolute: 0.3 10*3/uL (ref 0.1–1.0)
Monocytes Relative: 6 %
Neutro Abs: 3.8 10*3/uL (ref 1.7–7.7)
Neutrophils Relative %: 67 %
Platelets: 85 10*3/uL — ABNORMAL LOW (ref 150–400)
RBC: 4.3 MIL/uL (ref 3.87–5.11)
RDW: 14.1 % (ref 11.5–15.5)
WBC: 5.7 10*3/uL (ref 4.0–10.5)
nRBC: 0 % (ref 0.0–0.2)

## 2020-09-11 LAB — COMPREHENSIVE METABOLIC PANEL
ALT: 34 U/L (ref 0–44)
AST: 25 U/L (ref 15–41)
Albumin: 3.9 g/dL (ref 3.5–5.0)
Alkaline Phosphatase: 100 U/L (ref 38–126)
Anion gap: 9 (ref 5–15)
BUN: 21 mg/dL (ref 8–23)
CO2: 27 mmol/L (ref 22–32)
Calcium: 9.3 mg/dL (ref 8.9–10.3)
Chloride: 107 mmol/L (ref 98–111)
Creatinine, Ser: 1.02 mg/dL — ABNORMAL HIGH (ref 0.44–1.00)
GFR, Estimated: 58 mL/min — ABNORMAL LOW (ref >60)
Glucose, Bld: 81 mg/dL (ref 70–99)
Potassium: 4.4 mmol/L (ref 3.5–5.1)
Sodium: 143 mmol/L (ref 135–145)
Total Bilirubin: 0.8 mg/dL (ref 0.3–1.2)
Total Protein: 7.8 g/dL (ref 6.5–8.1)

## 2020-09-11 LAB — IRON AND TIBC
Iron: 95 ug/dL (ref 41–142)
Saturation Ratios: 28 % (ref 21–57)
TIBC: 333 ug/dL (ref 236–444)
UIBC: 238 ug/dL (ref 120–384)

## 2020-09-11 LAB — FERRITIN: Ferritin: 192 ng/mL (ref 11–307)

## 2020-09-11 MED ORDER — HEPARIN SOD (PORK) LOCK FLUSH 100 UNIT/ML IV SOLN
500.0000 [IU] | Freq: Once | INTRAVENOUS | Status: AC
  Administered 2020-09-11: 500 [IU] via INTRAVENOUS

## 2020-09-11 MED ORDER — SODIUM CHLORIDE 0.9% FLUSH
10.0000 mL | Freq: Once | INTRAVENOUS | Status: AC
Start: 2020-09-11 — End: 2020-09-11
  Administered 2020-09-11: 10 mL
  Filled 2020-09-11: qty 10

## 2020-09-11 MED ORDER — IOHEXOL 350 MG/ML SOLN
100.0000 mL | Freq: Once | INTRAVENOUS | Status: AC | PRN
  Administered 2020-09-11: 80 mL via INTRAVENOUS

## 2020-09-11 MED ORDER — HEPARIN SOD (PORK) LOCK FLUSH 100 UNIT/ML IV SOLN
INTRAVENOUS | Status: AC
  Filled 2020-09-11: qty 5

## 2020-09-11 MED ORDER — IOHEXOL 9 MG/ML PO SOLN
1000.0000 mL | ORAL | Status: AC
  Administered 2020-09-11: 1000 mL via ORAL

## 2020-09-12 ENCOUNTER — Encounter: Payer: Self-pay | Admitting: Hematology and Oncology

## 2020-09-12 ENCOUNTER — Other Ambulatory Visit: Payer: Self-pay | Admitting: Neurology

## 2020-09-12 ENCOUNTER — Inpatient Hospital Stay (HOSPITAL_BASED_OUTPATIENT_CLINIC_OR_DEPARTMENT_OTHER): Payer: Medicare Other | Admitting: Hematology and Oncology

## 2020-09-12 DIAGNOSIS — D696 Thrombocytopenia, unspecified: Secondary | ICD-10-CM

## 2020-09-12 DIAGNOSIS — E1140 Type 2 diabetes mellitus with diabetic neuropathy, unspecified: Secondary | ICD-10-CM

## 2020-09-12 DIAGNOSIS — M545 Low back pain, unspecified: Secondary | ICD-10-CM

## 2020-09-12 DIAGNOSIS — C482 Malignant neoplasm of peritoneum, unspecified: Secondary | ICD-10-CM | POA: Diagnosis not present

## 2020-09-12 DIAGNOSIS — G8929 Other chronic pain: Secondary | ICD-10-CM

## 2020-09-12 DIAGNOSIS — K7460 Unspecified cirrhosis of liver: Secondary | ICD-10-CM

## 2020-09-12 DIAGNOSIS — K7581 Nonalcoholic steatohepatitis (NASH): Secondary | ICD-10-CM

## 2020-09-12 DIAGNOSIS — Z794 Long term (current) use of insulin: Secondary | ICD-10-CM

## 2020-09-12 LAB — CA 125: Cancer Antigen (CA) 125: 26.6 U/mL (ref 0.0–38.1)

## 2020-09-12 MED ORDER — HYDROCODONE-ACETAMINOPHEN 5-325 MG PO TABS: 1.0000 | ORAL_TABLET | Freq: Four times a day (QID) | ORAL | 0 refills | Status: DC | PRN

## 2020-09-12 NOTE — Assessment & Plan Note (Signed)
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 It is stable She has no recent bleeding Observe closely for now

## 2020-09-12 NOTE — Assessment & Plan Note (Signed)
I have reviewed multiple CT imaging with the patient and her husband The left adnexa mass/nodule is suspicious but not definitive for cancer recurrence She is not symptomatic Previously noted lymphadenopathy has regressed in size At this point in time, I recommend watchful observation with serial blood work and imaging study I plan to see her again in 6 weeks with repeat blood work and repeat CT imaging in 3 months, due around November The patient is educated to watch out for signs and symptoms of cancer recurrence

## 2020-09-12 NOTE — Progress Notes (Signed)
[place] OFFICE PROGRESS NOTE  Patient Care Team: Jolinda Croak, MD as PCP - General (Family Medicine) Tat, Eustace Quail, DO as Consulting Physician (Neurology)  ASSESSMENT & PLAN:  Primary peritoneal carcinomatosis [facility]) I have reviewed multiple CT imaging with the patient and her husband The left adnexa mass/nodule is suspicious but not definitive for cancer recurrence She is not symptomatic Previously noted lymphadenopathy has regressed in size At this point in time, I recommend watchful observation with serial blood work and imaging study I plan to see her again in 6 weeks with repeat blood work and repeat CT imaging in 3 months, due around November The patient is educated to watch out for signs and symptoms of cancer recurrence  Diabetes mellitus with diabetic neuropathy, with long-term current use of insulin ([place]) She is on multiple different medications for diabetes We discussed importance of risk factor modification and aggressive management of diabetes in anticipation that she might need to resume chemotherapy in the future  Liver cirrhosis secondary to NASH [facility]) Her recent liver function test was within normal range CT imaging shows persistent liver cirrhosis and splenomegaly Observe closely  Thrombocytopenia ([place]) 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 It is stable She has no recent bleeding Observe closely for now  Low back pain She has chronic back pain and had recent treatment with injection to her back that was not helping with her pain We discussed importance of risk factor modification and weight loss I refilled her prescription of pain medicine today  Orders Placed This Encounter  Procedures   CBC with Differential/Platelet    Standing Status:   Standing    Number of Occurrences:   22    Standing Expiration Date:   09/12/2021   Comprehensive metabolic panel    Standing Status:   Standing    Number  of Occurrences:   33    Standing Expiration Date:   09/12/2021    All questions were answered. The patient knows to call the clinic with any problems, questions or concerns. The total time spent in the appointment was 30 minutes encounter with patients including review of chart and various tests results, discussions about plan of care and coordination of care plan   [name], MD 09/12/2020 12:02 PM  INTERVAL HISTORY: Please see below for problem oriented charting. 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 Had allergic reaction to carboplatin. Avastin was stopped due to GI hemorrhage   Primary peritoneal carcinomatosis ([place])  09/06/2011 Imaging   CT findings consistent with cirrhotic changes involving the liver.  No worrisome liver mass.  There are associated portal venous collaterals and splenomegaly consistent with portal venous hypertension. 2.  No other significant upper abdominal findings    11/08/2013 Imaging   US showed ascites    11/08/2013 Initial Diagnosis   Patient had ~ 2 months of some urinary incontinence, saw Dr Ronita Hipps in 10-2013, with pelvic mass on exam     11/12/2013 Imaging   There are findings of progressive hepatic cirrhosis with a large amount of ascites and mild splenomegaly. There are venous collaterals present. 2. There is abnormal thickening of  the peritoneal surface along the left mid and lower abdominal wall. There is abnormal soft tissue density in the pelvis as well which could reflect the clinically suspected ovarian malignancy but the findings are nondiagnostic. 3. There is a new small left pleural  effusion. 4. There is no acute bowel abnormality.    11/18/2013 Imaging   Successful ultrasound guided diagnostic and therapeutic paracentesis yielding 4.1 liters of ascites.      11/18/2013 Pathology Results   PERITONEAL/ASCITIC FLUID (SPECIMEN 1 OF 1 COLLECTED 11/18/13): SEROUS CARCINOMA, PLEASE SEE COMMENT.    12/01/2013 Tumor Marker   Patient's tumor was tested for the following markers: CA125 Results of the tumor marker test revealed 1938    12/03/2013 Imaging   Successful ultrasound-guided therapeutic paracentesis yielding 3.6 liters of peritoneal fluid.    12/07/2013 - 05/30/2014 Chemotherapy   She received 3 cycles of carboplatin and Taxol, interrupted cycle 4 for surgery.  She subsequently completed 3 more cycles of chemotherapy after surgery    12/20/2013 Imaging   Successful ultrasound-guided diagnostic and therapeutic paracentesis yielding 2.2 liters of peritoneal fluid    12/20/2013 Pathology Results   PERITONEAL/ASCITIC FLUID(SPECIMEN 1 OF 1 COLLECTED 12/20/13): MALIGNANT CELLS CONSISTENT WITH SEROUS CARCINOMA    01/13/2014 Tumor Marker   Patient's tumor was tested for the following markers: CA125 Results of the tumor marker test revealed 227    02/07/2014 Tumor Marker   Patient's tumor was tested for the following markers: CA125 Results of the tumor marker test revealed 130    02/15/2014 Genetic Testing   Genetics testing from 02-2014 normal ([employer] panel)    02/23/2014 Imaging   Interval decrease in ascites. 2. Morphologic changes in the liver consistent with cirrhosis. 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 Portal vein remains patent at this time. 3. Persistent but decreased abnormal soft tissue attenuation tracking in the omental fat. This may be secondary to interval improvement in metastatic disease. 4. Peritoneal thickening seen in the left abdomen and pelvis on the previous study has decreased and nearly resolved in  the interval. This also suggests interval improvement in metastatic disease.      03/07/2014 Procedure   Ultrasound and fluoroscopically guided right internal jugular single lumen power port catheter insertion. Tip in the SVC/RA junction. Catheter ready for use.    03/17/2014 Tumor Marker   Patient's tumor was tested for the following markers: CA125 Results of the tumor marker test revealed 45    03/29/2014 Pathology Results   1. Omentum, resection for tumor - HIGH GRADE SEROUS CARCINOMA, SEE COMMENT. 2. Ovary and fallopian tube, right - HIGH GRADE SEROUS CARCINOMA, SEE COMMENT. 3. Ovary and fallopian tube, left - HIGH GRADE SEROUS CARCINOMA, SEE COMMENT. Diagnosis Note 1. Nests and clusters of malignant cells are invading the omental tissue (part #1) with associated fibrosis. The cells are pleomorphic with prominent nucleoli. There are scattered psammoma bodies. The ovaries are atrophic and exhibit multiple foci of surface based invasive carcinoma and associated fibrosis. The fallopian tubes have a few foci of carcinoma, also superficially located. There are no precursor lesions noted in the ovaries or fallopian tubes (the entire tubes and ovaries were submitted for evaluation). While there is some retraction artifact, there are several foci suspicious for lymphovascular invasion. 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 Given the fibrosis, there does appear to be a small amount of  treatment effect, however, there is abundant residual tumor.     03/29/2014 Surgery   Procedure(s) Performed: 1. Exploratory laparotomy with bilateral salpingo-oophorectomy, omentectomy radical tumor debulking for ovarian cancer .   Surgeon: Thereasa Solo, MD.   Assistant Surgeon: Lahoma Crocker, M.D. Assistant: (an MD assistant was necessary for tissue manipulation,  retraction and positioning due to the complexity of the case and hospital policies).  Operative Findings: 10cm omental cake from hepatic to splenic flexure, densely adherent to transverse colon. Milliary studding of tumor implants (<38m, too numerous in number to count) adherent to the mesentery of the small bowel and small bowel wall. Small volume (100cc) ascites. Small ovaries bilaterally, densely adherent to the pelvic cul de sac. Left ovary and tube densely adherent to sigmoid colon. Cirrhotic liver with hepatomegaly and splenomegaly.     This represented an optimal cytoreduction (R1) with visible disease residual on the bowel wall and mesentery (millial, <37mimplants).       04/18/2014 Tumor Marker   Patient's tumor was tested for the following markers: CA125 Results of the tumor marker test revealed 122    05/16/2014 Tumor Marker   Patient's tumor was tested for the following markers: CA125 Results of the tumor marker test revealed 30    06/10/2014 Tumor Marker   Patient's tumor was tested for the following markers: CA125 Results of the tumor marker test revealed 19    07/04/2014 Imaging   Interval improvement in the appearance of peritoneal metastasis secondary to ovarian cancer. 2. 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      08/18/2014 Tumor Marker   Patient's tumor was tested for the following markers: CA125 Results of the tumor marker test revealed 16    10/13/2014 Tumor Marker   Patient's tumor was tested for the following markers: CA125 Results of the tumor marker test revealed 14    11/21/2014 Tumor Marker   Patient's tumor was tested for the following markers: CA125 Results of the tumor marker test revealed 16    12/28/2014 Tumor Marker   Patient's tumor was tested for the following markers: CA125 Results of the tumor marker test revealed 762    02/27/2015 Tumor Marker   Patient's tumor was tested for the following markers: CA125 Results of  the tumor marker test revealed 20.2    03/22/2015 Imaging   Filler appearance to the prior exam, with very subtle fluid tracking along portions of the liver, and very minimal nodularity along the right paracolic gutter representing residua from the prior peritoneal cancer. The current abnormalities could simply be post therapy findings rather than necessarily representing residual malignancy. No new or enlarging lesions are identified. 2. Hepatic cirrhosis and splenomegaly. There is some gastric varices suggesting portal venous hypertension. 3. Left foraminal stenosis at L4-5 due to spurring. There is likely also some central narrowing of the thecal sac at this level. 4. Bibasilar scarring. 5. Chronic mild left mid kidney scarring    03/27/2015 Tumor Marker   Patient's tumor was tested for the following markers: CA125 Results of the tumor marker test revealed 25.3    04/24/2015 Imaging   Disc bulge L2-3 with mild spinal stenosis and right foraminal encroachment. 2. Disc bulge L3-4 with mild spinal stenosis. 3. Multifactorial spinal, left lateral recess and foraminal stenosis L4-5. There is grade 1 anterolisthesis without evident dynamic instability.    05/29/2015 Tumor Marker   Patient's tumor was tested for the following markers: CA125 Results of the tumor marker test revealed 29.9  08/21/2015 Tumor Marker   Patient's tumor was tested for the following markers: CA125 Results of the tumor marker test revealed 45    09/18/2015 Tumor Marker   Patient's tumor was tested for the following markers: CA125 Results of the tumor marker test revealed 47    09/27/2015 Imaging   New small bowel mesenteric nodule and enlarged left external iliac lymph node, highly worrisome for metastatic disease. 2. Cirrhosis and splenomegaly    10/10/2015 Tumor Marker   Patient's tumor was tested for the following markers: CA125 Results of the tumor marker test revealed 53.4    10/10/2015 - 12/11/2015  Chemotherapy   She received 4 cycles of carboplatin only    11/20/2015 Tumor Marker   Patient's tumor was tested for the following markers: CA125 Results of the tumor marker test revealed 41.3    12/20/2015 Imaging   CT: Mild left external iliac lymphadenopathy is slightly decreased. Mildly enlarged lower mesenteric nodule is slightly decreased. 2. No new or progressive metastatic disease in the abdomen or pelvis. 3. Cirrhosis. Stable subcentimeter hypodense left liver lobe lesion. No new liver lesions. 4. Stable mild splenomegaly.  No ascites. 5. Mild sigmoid diverticulosis.    01/01/2016 -  Anti-estrogen oral therapy   She was placed on tamoxifen. Had letrozole initially but switched to tamoxifen due to poor tolerance    01/15/2016 Tumor Marker   Patient's tumor was tested for the following markers: CA125 Results of the tumor marker test revealed 31.4    02/19/2016 Tumor Marker   Patient's tumor was tested for the following markers: CA125 Results of the tumor marker test revealed 36.5    05/13/2016 Imaging   No evidence of disease progression within the abdomen or pelvis. Previously noted small central mesenteric nodule and left external iliac node appear slightly smaller. 2. Stable changes of hepatic cirrhosis and portal hypertension with associated splenomegaly. No new or enlarging hepatic lesions are identified. 3. No acute findings.    05/13/2016 Tumor Marker   Patient's tumor was tested for the following markers: CA125 Results of the tumor marker test revealed 41.2    06/24/2016 Tumor Marker   Patient's tumor was tested for the following markers: CA125 Results of the tumor marker test revealed 47.3    08/20/2016 Imaging   1. The left external iliac lymph node is slightly increased in size compared to the prior exam, currently 1.1 cm and previously 0.9 cm in diameter. 2. Stable central mesenteric lymph node at 0.9 cm in diameter. 3. Stable trace free pelvic fluid and stable  slight thickening along the right paracolic gutter, without well-defined peritoneal nodularity. 4. Other imaging findings of potential clinical significance: Subsegmental atelectasis or scarring in the lung bases. Hepatic cirrhosis. Left renal scarring. Mild splenomegaly. Sigmoid colon diverticulosis. Impingement at L4-5 due to spondylosis and degenerative disc disease broad Schmorl' s nodes at L3-L4. Pelvic floor laxity    08/23/2016 Tumor Marker   Patient's tumor was tested for the following markers: CA125 Results of the tumor marker test revealed 80.2    09/04/2016 Imaging   LV EF: 60% -   65%    09/12/2016 Tumor Marker   Patient's tumor was tested for the following markers: CA125 Results of the tumor marker test revealed 98.5    09/12/2016 - 01/29/2018 Chemotherapy   The patient received Doxil and Avastin. Avastin was discontinued in Dec 2019 due to GI hemorrhage    09/26/2016 Tumor Marker   Patient's tumor was tested for the following markers: CA125 Results  of the tumor marker test revealed 68.9    10/10/2016 Tumor Marker   Patient's tumor was tested for the following markers: CA125 Results of the tumor marker test revealed 65.6    11/07/2016 Tumor Marker   Patient's tumor was tested for the following markers: CA125 Results of the tumor marker test revealed 46.4    11/29/2016 Imaging   Stable mild peritoneal thickening, suspicious for peritoneal carcinomatosis. No new or progressive disease identified. No evidence of ascites.  No significant change in 10 mm left external iliac and 8 mm mesenteric lymph nodes.  Stable hepatic cirrhosis, and splenomegaly consistent with portal venous hypertension.  Colonic diverticulosis, without radiographic evidence of diverticulitis.    12/02/2016 Imaging   Normal LV size with EF 55%. Basal inferior and basal inferoseptal hypokinesis. Normal RV size and systolic function. No significant valvular abnormalities.    12/05/2016 Tumor  Marker   Patient's tumor was tested for the following markers: CA125 Results of the tumor marker test revealed 49.1    01/09/2017 Tumor Marker   Patient's tumor was tested for the following markers: CA125 Results of the tumor marker test revealed 47.2    02/06/2017 Tumor Marker   Patient's tumor was tested for the following markers: CA125 Results of the tumor marker test revealed 44.1    02/18/2017 Imaging   Stable mild peritoneal thickening. No new or progressive disease identified within the abdomen or pelvis.  Stable tiny sub-cm left external iliac and mesenteric lymph nodes.  Stable hepatic cirrhosis, and splenomegaly consistent with portal venous hypertension. No evidence of hepatic neoplasm.  Colonic diverticulosis, without radiographic evidence of diverticulitis.    03/06/2017 Tumor Marker   Patient's tumor was tested for the following markers: CA125 Results of the tumor marker test revealed 50.4    03/17/2017 Imaging   - Left ventricle: The cavity size was normal. Wall thickness was normal. Systolic function was normal. The estimated ejection fraction was in the range of 55% to 60%. Wall motion was normal; there were no regional wall motion abnormalities. Features are consistent with a pseudonormal left ventricular filling pattern, with concomitant abnormal relaxation and increased filling pressure (grade 2 diastolic dysfunction). - Mitral valve: There was mild regurgitation. - Left atrium: The atrium was mildly dilated    04/03/2017 Tumor Marker   Patient's tumor was tested for the following markers: CA125 Results of the tumor marker test revealed 47.2    05/14/2017 Imaging   CT scan of abdomen and pelvis Stable mild peritoneal thickening. No new or progressive disease within the abdomen or pelvis.  Stable hepatic cirrhosis. Stable splenomegaly, consistent with portal venous hypertension. No evidence of hepatic neoplasm.    05/15/2017 Tumor Marker   Patient's tumor was  tested for the following markers: CA125 Results of the tumor marker test revealed 56.1    06/12/2017 Tumor Marker   Patient's tumor was tested for the following markers: CA125 Results of the tumor marker test revealed 49.2    07/10/2017 Tumor Marker   Patient's tumor was tested for the following markers: CA125 Results of the tumor marker test revealed 46.3    08/07/2017 Tumor Marker   Patient's tumor was tested for the following markers: CA125 Results of the tumor marker test revealed 52.9    08/20/2017 Imaging   1. Mild omental/peritoneal haziness, unchanged. No evidence of new metastatic disease. 2. Cirrhosis with splenomegaly.    09/04/2017 Tumor Marker   Patient's tumor was tested for the following markers: CA125 Results of the tumor marker  test revealed 48.5    11/13/2017 Tumor Marker   Patient's tumor was tested for the following markers: CA125 Results of the tumor marker test revealed 55.8    12/17/2017 Imaging   1. No findings to suggest metastatic disease in the abdomen or pelvis. 2. Severe hepatic cirrhosis with evidence of portal hypertension, as demonstrated by dilated portal vein, splenomegaly and portosystemic collateral pathways, including gastric and esophageal varices.  3. Colonic diverticulosis without evidence of acute diverticulitis at this time. 4. Additional incidental findings, as above.    01/16/2018 Tumor Marker   Patient's tumor was tested for the following markers: CA125 Results of the tumor marker test revealed 63.7    02/10/2018 Procedure   EGD - Recently bleeding grade III and large (> 5 mm) esophageal varices. Completely eradicated. Banded. - Portal hypertensive gastropathy. - Normal examined duodenum. - No specimens collected    02/13/2018 Tumor Marker   Patient's tumor was tested for the following markers: CA125 Results of the tumor marker test revealed 82.7    02/25/2018 Imaging   Bone density showed mild osteopenia    04/08/2018  Tumor Marker   Patient's tumor was tested for the following markers: CA125 Results of the tumor marker test revealed 82.7    04/08/2018 Imaging   1. No definite omental or peritoneal surface lesions. However, there are 2 slowly enlarging lymph nodes noted. One is in the small bowel mesentery and the other is in the left deep pelvis. Could not exclude recurrent disease. 2. Stable advanced cirrhotic changes involving the liver with portal venous hypertension, portal venous collaterals, esophageal varices and splenomegaly. No worrisome hepatic lesions. 3. Diffuse wall thickening of the colon could suggest diffuse colitis or could be due to low albumin. Recommend correlation with any symptoms such as diarrhea.    05/21/2018 Tumor Marker   Patient's tumor was tested for the following markers: CA125 Results of the tumor marker test revealed 86    09/07/2018 Tumor Marker   Patient's tumor was tested for the following markers:CA-125 Results of the tumor marker test revealed 111   09/29/2018 Tumor Marker   Patient's tumor was tested for the following markers: CA-125 Results of the tumor marker test revealed 121.   09/29/2018 - 11/24/2018 Chemotherapy   The patient had carboplatin and gemzar for chemotherapy treatment.  Chemo was stopped due to allergic reaction to carboplatin   10/27/2018 Tumor Marker   Patient's tumor was tested for the following markers: CA-125 Results of the tumor marker test revealed 99.9   11/24/2018 Tumor Marker   Patient's tumor was tested for the following markers: CA-125 Results of the tumor marker test revealed 81.2   12/14/2018 Tumor Marker   Patient's tumor was tested for the following markers: CA-125 Results of the tumor marker test revealed 81.2   12/14/2018 Imaging   1. Mild improvement in peritoneal disease. Peritoneal nodule within the left posterior pelvis contiguous with the left side of vaginal cuff is slightly decreased in size in the interval. Within  the right iliac fossa there is a small peritoneal nodule which has decreased in size in the interval. Central small bowel mesenteric lymph node is not significantly changed. Slight decrease in size left periaortic lymph node.    2. Morphologic features of the liver compatible with cirrhosis. Splenomegaly is noted which may reflect portal venous hypertension.   12/21/2018 Tumor Marker   Patient's tumor was tested for the following markers: CA-125 Results of the tumor marker test revealed 64.2   12/21/2018 -  Chemotherapy   The patient had taxotere for chemotherapy treatment.     03/02/2019 Tumor Marker   Patient's tumor was tested for the following markers: CA-125. 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   No new or progressive metastatic disease within the abdomen or pelvis.   Hepatic cirrhosis and findings of portal venous hypertension. 1.1 cm low-attenuation lesion within left hepatic lobe, which could represent a regenerative nodule or small hepatocellular carcinoma. Recommend abdomen MRI without and with contrast for further characterization.     04/13/2019 Tumor Marker   Patient's tumor was tested for the following markers: CA-125 Results of the tumor marker test revealed 36.8   05/26/2019 Tumor Marker   Patient's tumor was tested for the following markers: CA-125 Results of the tumor marker test revealed 31.6.   06/15/2019 Tumor Marker   Patient's tumor was tested for the following markers: CA-125 Results of the tumor marker test revealed 25.4   07/05/2019 Imaging   1. Stable CT examination. Stable peritoneal thickening in the pelvis and paracolic gutters. Stable solid left deep pelvic implant. Stable trace ascites. No new or progressive metastatic disease. 2. Stable cirrhosis. No discrete liver masses. 3. Stable mild-to-moderate splenomegaly. 4. Aortic Atherosclerosis  (ICD10-I70.0).   07/27/2019 Tumor Marker   Patient's tumor was tested for the following markers: CA-125 Results of the tumor marker test revealed 35.4   08/17/2019 Tumor Marker   Patient's tumor was tested for the following markers: Ca-125 Results of the tumor marker test revealed 35.7.   09/17/2019 - 09/21/2019 Hospital Admission   She was admitted due to esophageal variceal bleeding presents with hematemesis and melanotic stool with epigastric pain.  GI consulted, started on octreotide infusion.  Underwent EGD which showed minimal bleeding, remain on octreotide drip, PPI drip.  Hemoglobin trended down therefore received PRBC transfusion.  Over several days her bleeding subsided, hemodynamically remained stable.    09/17/2019 Procedure   EGD report - Esophageal mucosal scarring, from prior esophageal banding sessions. - Grade I esophageal varices. - Red blood in the cardia and in the gastric fundus. - Portal hypertensive gastropathy. - Normal duodenal bulb, first portion of the duodenum and second portion of the duodenum. 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 2. Otherwise stable CT exam. Stable peritoneal thickening within the bilateral deep pelvic peritoneum. Stable left deep pelvic implant.No evidence for progressed disease. 3. Stable splenomegaly. 4. Morphologic changes to the liver compatible with cirrhosis. Trace perihepatic and pelvic ascites.   02/22/2020 Imaging   1. New lucent lesion in the left side of the L4 vertebral body measuring 1 cm, concerning for possible osseous metastasis.  Consideration for further evaluation with bone scan is suggested. Close attention on follow-up imaging is also recommended to ensure stability. 2. Previously noted peritoneal thickening and nodularity in the low anatomic pelvis has regressed slightly. 3. Borderline enlarged and mildly enlarged pelvic and retroperitoneal lymph nodes appear similar to slightly increased compared to the prior examination, as above. Continued attention on follow-up studies is recommended.  4. Hepatic cirrhosis with evidence of portal venous hypertension, including dilated  portal vein and splenomegaly. 5. Aortic atherosclerosis. 6. Additional incidental findings, as above.   02/22/2020 Tumor Marker   Patient's tumor was tested for the following markers: CA-125 Results of the tumor marker test revealed 30.1   04/06/2020 Tumor Marker   Patient's tumor was tested for the following markers: CA-125 Results of the tumor marker test revealed 35.7   09/11/2020 Tumor Marker   Patient's tumor was tested for the following markers: CA-125. Results of the tumor marker test revealed 26.6.   09/12/2020 Imaging   1. Slow enlargement of a currently 2.9 cubic cm soft tissue nodule along the left adnexa. Tumor deposit not excluded. 2. The left external iliac lymph node has substantially reduced in size, currently 0.9 cm in short axis and formerly 1.4 cm. 3. 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 4. Cirrhosis with portal venous hypertension. 5. Mild distal esophageal wall thickening, probably from esophagitis. Splenomegaly. 6. Scarring in the left kidney. 7. Sigmoid colon diverticulosis. 8. Pelvic floor laxity with small cystocele. 9. Lumbar impingement at L2-3, L3-4, and L4-5.       REVIEW OF SYSTEMS:   Constitutional: Denies fevers, chills or abnormal weight loss Eyes: Denies blurriness of vision Ears,  nose, mouth, throat, and face: Denies mucositis or sore throat Respiratory: Denies cough, dyspnea or wheezes Cardiovascular: Denies palpitation, chest discomfort or lower extremity swelling Gastrointestinal:  Denies nausea, heartburn or change in bowel habits Skin: Denies abnormal skin rashes Lymphatics: Denies new lymphadenopathy or easy bruising Neurological:Denies numbness, tingling or new weaknesses Behavioral/Psych: Mood is stable, no new changes  All other systems were reviewed with the patient and are negative.  I have reviewed the past medical history, past surgical history, social history and family history with the patient and they are unchanged from previous note.  ALLERGIES:  is allergic to carboplatin, shellfish allergy, vancomycin, benadryl [diphenhydramine hcl], and penicillins.  MEDICATIONS:  Current Outpatient Medications  Medication Sig Dispense Refill   Insulin Degludec 100 UNIT/ML SOLN Inject 30 Units into the skin daily.     Semaglutide,0.25 or 0.5MG/DOS, (OZEMPIC, 0.25 OR 0.5 MG/DOSE,) 2 MG/1.5ML SOPN Inject 0.5 mg into the skin once a week.     calcium elemental as carbonate (BARIATRIC TUMS ULTRA) 400 MG chewable tablet Chew 1 tablet by mouth 2 (two) times daily.     Cholecalciferol (VITAMIN D) 50 MCG (2000 UT) tablet Take 2,000 Units by mouth at bedtime.     gabapentin (NEURONTIN) 300 MG capsule TAKE 3 CAPSULES(900 MG) BY MOUTH TWICE DAILY 120 capsule 11   HYDROcodone-acetaminophen (NORCO/VICODIN) 5-325 MG tablet Take 1 tablet by mouth every 6 (six) hours as needed for moderate pain. 60 tablet 0   insulin degludec (TRESIBA) 200 UNIT/ML FlexTouch Pen Inject into the skin. (Patient not taking: Reported on 09/12/2020)     lidocaine-prilocaine (EMLA) cream Apply to Porta-cath  1-2 hours prior to access as directed. (Patient taking differently: Apply 1 application topically daily as needed (port access).) 30 g 9   nadolol (CORGARD) 20 MG tablet Take 1 tablet (20 mg total) by  mouth daily. (Patient taking differently: Take 20 mg by mouth at bedtime.) 90 tablet 3   ondansetron (ZOFRAN) 8 MG tablet TAKE 1 TABLET(8 MG) BY MOUTH EVERY 8 HOURS AS NEEDED 60 tablet 1   pantoprazole (PROTONIX) 40 MG tablet Take 1 tablet (40 mg total) by mouth daily.     prochlorperazine (COMPAZINE) 10 MG tablet Take 1  tablet (10 mg total) by mouth every 6 (six) hours as needed (Nausea or vomiting). 30 tablet 1   rotigotine (NEUPRO) 4 MG/24HR APPLY 1 PATCH TOPICALLY TO THE SKIN DAILY 90 patch 0   SEMAGLUTIDE,0.25 OR 0.5MG/DOS, [place] Inject 0.25 mg into the skin once a week.     No current facility-administered medications for this visit.    PHYSICAL EXAMINATION: ECOG PERFORMANCE STATUS: 1 - Symptomatic but completely ambulatory  Vitals:   09/12/20 0758  BP: 136/80  Pulse: 69  Resp: 18  Temp: (!) 97.5 F (36.4 C)  SpO2: 98%   Filed Weights   09/12/20 0758  Weight: 190 lb 3.2 oz (86.3 kg)    GENERAL:alert, no distress and comfortable NEURO: alert & oriented x 3 with fluent speech, no focal motor/sensory deficits  LABORATORY DATA:  I have reviewed the data as listed    Component Value Date/Time   NA 143 09/11/2020 0831   NA 142 02/06/2017 0742   K 4.4 09/11/2020 0831   K 3.9 02/06/2017 0742   CL 107 09/11/2020 0831   CO2 27 09/11/2020 0831   CO2 22 02/06/2017 0742   GLUCOSE 81 09/11/2020 0831   GLUCOSE 156 (H) 02/06/2017 0742   BUN 21 09/11/2020 0831   BUN 13.5 02/06/2017 0742   CREATININE 1.02 (H) 09/11/2020 0831   CREATININE 0.85 10/26/2019 0722   CREATININE 0.8 02/06/2017 0742   CALCIUM 9.3 09/11/2020 0831   CALCIUM 8.5 02/06/2017 0742   PROT 7.8 09/11/2020 0831   PROT 6.9 02/06/2017 0742   ALBUMIN 3.9 09/11/2020 0831   ALBUMIN 3.7 02/06/2017 0742   AST 25 09/11/2020 0831   AST 24 10/26/2019 0722   AST 47 (H) 02/06/2017 0742   ALT 34 09/11/2020 0831   ALT 17 10/26/2019 0722   ALT 54 02/06/2017 0742   ALKPHOS 100 09/11/2020 0831   ALKPHOS 130 02/06/2017 0742    BILITOT 0.8 09/11/2020 0831   BILITOT 0.7 10/26/2019 0722   BILITOT 0.65 02/06/2017 0742   GFRNONAA 58 (L) 09/11/2020 0831   GFRNONAA >60 10/26/2019 0722   GFRAA >60 11/09/2019 0953   GFRAA >60 10/26/2019 0722    No results found for: SPEP, UPEP  Lab Results  Component Value Date   WBC 5.7 09/11/2020   NEUTROABS 3.8 09/11/2020   HGB 13.5 09/11/2020   HCT 40.1 09/11/2020   MCV 93.3 09/11/2020   PLT 85 (L) 09/11/2020      Chemistry      Component Value Date/Time   NA 143 09/11/2020 0831   NA 142 02/06/2017 0742   K 4.4 09/11/2020 0831   K 3.9 02/06/2017 0742   CL 107 09/11/2020 0831   CO2 27 09/11/2020 0831   CO2 22 02/06/2017 0742   BUN 21 09/11/2020 0831   BUN 13.5 02/06/2017 0742   CREATININE 1.02 (H) 09/11/2020 0831   CREATININE 0.85 10/26/2019 0722   CREATININE 0.8 02/06/2017 0742      Component Value Date/Time   CALCIUM 9.3 09/11/2020 0831   CALCIUM 8.5 02/06/2017 0742   ALKPHOS 100 09/11/2020 0831   ALKPHOS 130 02/06/2017 0742   AST 25 09/11/2020 0831   AST 24 10/26/2019 0722   AST 47 (H) 02/06/2017 0742   ALT 34 09/11/2020 0831   ALT 17 10/26/2019 0722   ALT 54 02/06/2017 0742   BILITOT 0.8 09/11/2020 0831   BILITOT 0.7 10/26/2019 0722   BILITOT 0.65 02/06/2017 0742       RADIOGRAPHIC STUDIES: I have  reviewed multiple CT imaging with the patient and her husband I have personally reviewed the radiological images as listed and agreed with the findings in the report. MR Lumbar Spine w/o contrast  Result Date: 08/27/2020 CLINICAL DATA:  75 year old female with greater than 6 weeks of low back pain. Pain radiating to the left leg. No known injury. EXAM: MRI LUMBAR SPINE WITHOUT CONTRAST TECHNIQUE: Multiplanar, multisequence MR imaging of the lumbar spine was performed. No intravenous contrast was administered. COMPARISON:  CT Abdomen and Pelvis Outside lumbar MRI 03/23/2015. FINDINGS: Segmentation:  Normal on the comparison CT. Alignment: Mild  retrolisthesis of L3 on L4 and similar anterolisthesis of L4 on L5 are stable since April, retrolisthesis at the former appears increased since 2017. Vertebrae: There is Patchy and confluent marrow edema about the central and left L3-L4 endplates, also involving some of the left L3-L4 posterior elements. Schmorl's node type endplate irregularity is redemonstrated there and appears stable from the April CT. There is faint associated inflammation in the medial left psoas muscle (series 3, image 14), but no other paraspinal soft tissue inflammation. Superimposed L3 superior endplate compression is stable since 2017 with no superior endplate edema. Other lumbar vertebrae appear intact with normal background bone marrow signal. Intact visible sacrum and SI joints. Conus medullaris and cauda equina: Conus extends to the L1-L2 level. No lower spinal cord or conus signal abnormality. Paraspinal and other soft tissues: Negative visible abdominal viscera. Faint medial left psoas muscle STIR hyperintense inflammation as stated above. Other paraspinal soft tissues appear negative. Disc levels: Normal for age visible lower thoracic levels through L1-L2. L2-L3: Chronic but increased disc desiccation since 2017. Increased circumferential disc bulging eccentric to the right. Increased moderate facet and ligament flavum hypertrophy. New mild to moderate spinal stenosis and right lateral recess stenosis (right L3 nerve level). Increased mild right L3 foraminal stenosis. L3-L4: Mild retrolisthesis has increased since 2017. Broad-based circumferential disc bulge and endplate spurring plus moderate to severe facet and ligament flavum hypertrophy. Mild epidural lipomatosis. New moderate to severe spinal stenosis (series 5, image 22). Moderate to severe left L3 foraminal stenosis is new. L4-L5: Chronic grade 1 anterolisthesis. Circumferential disc/pseudo disc and endplate spurring slightly eccentric to the left. Moderate to severe facet  and ligament flavum hypertrophy has increased. Moderate spinal and bilateral lateral recess stenosis appears stable to mildly increased. Moderate to severe left and mild to moderate right L4 foraminal stenosis is stable. L5-S1: Negative disc. Mild to moderate facet hypertrophy mild epidural lipomatosis. No stenosis. IMPRESSION: 1. Endplate marrow edema associated with the L3 and L4 Schmorl's nodes demonstrated by CT in April, and also involving the chronically degenerated left posterior elements at that level. Although there is faint inflammation in the adjacent medial left psoas muscle, this appears to be degenerative in nature rather than infectious. If the patient is diabetic and/or immunocompromised then consider correlation with serum infectious/inflammatory markers. 2. Since 2017 progressed mild spondylolisthesis, progressed disc and posterior element degeneration, and subsequent multifactorial lumbar spinal stenosis from L2-L3 through L4-L5: - moderate to severe spinal stenosis at L3-L4 with new moderate to severe left foraminal stenosis. - mild to moderate spinal and right lateral recess stenosis at L2-L3. Stable - moderate spinal, lateral recess stenosis at L4-L5 with moderate to severe left greater than right foraminal stenosis. Query Left L3 and/or L4 radiculitis. Electronically Signed   By: Genevie Ann M.D.   On: 08/27/2020 07:53   CT ABDOMEN PELVIS W CONTRAST  Result Date: 09/11/2020 CLINICAL DATA:  Ovarian cancer  restaging EXAM: CT ABDOMEN AND PELVIS WITH CONTRAST TECHNIQUE: Multidetector CT imaging of the abdomen and pelvis was performed using the standard protocol following bolus administration of intravenous contrast. CONTRAST:  55m OMNIPAQUE IOHEXOL 350 MG/ML SOLN COMPARISON:  05/22/2020 FINDINGS: Lower chest: Bilateral breast implants noted. Mild distal esophageal wall thickening, esophagitis would be a common cause. Stable bibasilar scarring. Hepatobiliary: Hepatic cirrhosis. No focal hepatic  mass identified. Dependent densities in the gallbladder compatible with gallstones. Pancreas: Unremarkable Spleen: The spleen measures 14.5 by 5.6 by 14.5 cm (volume = 620 cm^3), compatible with splenomegaly. No significant focal splenic lesion. Adrenals/Urinary Tract: Left renal scarring and mild left renal atrophy. Calyceal diverticulum along the region of scarring in the mid kidney, image 16 series 7. Adrenal glands unremarkable. The urinary bladder extends 9 mm below the pubococcygeal line compatible with pelvic floor laxity and cystocele. Stomach/Bowel: Sigmoid colon diverticulosis. Low position of the anorectal junction compatible with pelvic floor laxity. Vascular/Lymphatic: Perirectal portosystemic shunting compatible with portal venous hypertension. Aortoiliac atherosclerotic vascular disease. A left external iliac node measures 0.9 cm in short axis on image 68 series 2, formerly 1.4 cm, compatible with substantial improvement. Reproductive: Uterus is absent and the patient has undergone bilateral salpingo oophorectomy. 2.3 by 1.5 by 1.6 cm (volume = 2.9 cm^3) soft tissue density nodularity along the left adnexa is also immediately tangential to the sigmoid colon. Although this could conceivably reflect a colonic diverticulum, I favor either ovarian remnant or tumor. This measured 1.6 by 1.4 by 1.3 cm (volume = 1.5 cm^3) on 03/22/2019. I measure this lesion at 2.3 by 1.2 by 1.5 cm (volume = 2.2 cm^3) on 05/22/2020. Other: Subtle thickening and nodularity along the paracolic gutters unchanged from prior. Faint stranding along the residuum of the omentum without well-defined omental tumor deposit Musculoskeletal: Superior endplate concavity at L3 likely due to a previous superior endplate compression. Broad Schmorl's node with surrounding reactive sclerosis in the superior endplate at L4. Grade 1 degenerative anterolisthesis at L4-5 and grade 1 degenerative retrolisthesis at L3-4. Lumbar spondylosis and  degenerative disc disease cause varying degrees of impingement at L2-3, L3-4, and L4-5. IMPRESSION: 1. Slow enlargement of a currently 2.9 cubic cm soft tissue nodule along the left adnexa. Tumor deposit not excluded. 2. The left external iliac lymph node has substantially reduced in size, currently 0.9 cm in short axis and formerly 1.4 cm. 3. 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 4. Cirrhosis with portal venous hypertension. 5. Mild distal esophageal wall thickening, probably from esophagitis. Splenomegaly. 6. Scarring in the left kidney. 7. Sigmoid colon diverticulosis. 8. Pelvic floor laxity with small cystocele. 9. Lumbar impingement at L2-3, L3-4, and L4-5. Electronically Signed   By: WVan ClinesM.D.   On: 09/11/2020 15:27   Epidural Steroid Injection - Lumbar/Sacral (Ancillary Performed)  Result Date: 09/01/2020 CLINICAL DATA:  Lumbosacral spondylosis without myelopathy. Since 2017 progressed mild spondylolisthesis, progressed disc and posterior element degeneration, and subsequent multifactorial lumbar spinal stenosis from L2-L3 through L4-L5: - moderate to severe spinal stenosis at L3-L4 with new moderate to severe left foraminal stenosis. - mild to moderate spinal and right lateral recess stenosis at L2-L3. Stable - moderate spinal, lateral recess stenosis at L4-L5 with moderate to severe left greater than right foraminal stenosis. Query Left L3 and/or L4 radiculitis. Bilateral lower extremity pain, left greater than right. EXAM: LUMBAR EPIDURAL INJECTION: DIAGNOSTIC EPIDURAL INJECTION: THERAPEUTIC EPIDURAL INJECTION: PROCEDURE: The procedure, risks, benefits,  and alternatives were explained to the patient. Questions regarding the procedure were encouraged and answered. The patient understands and consents to the procedure. An interlaminar approach was performed on left at  L5-S1. The overlying skin was cleansed and anesthetized. A 20 gauge epidural needle was advanced using loss-of-resistance technique. Injection of Isovue-M 200 shows a good epidural pattern with spread above and below the level of needle placement, primarily on the left no vascular opacification is seen. 79m of Depo-Medrol mixed with 247mlidocaine 1% were instilled. The procedure was well-tolerated, and the patient was discharged thirty minutes following the injection in good condition. FLUOROSCOPY TIME:  25 seconds; 22 uGym2 DAP COMPLICATIONS: None immediate IMPRESSION: Technically successful epidural injection on the left at L5-S1. Electronically Signed   By: D Lucrezia Europe.D.   On: 09/01/2020 09:38   XR Lumbar Spine 2-3 Views  Result Date: 08/18/2020 Superior endplate changes of L4.  Metastatic disease cannot be ruled out.

## 2020-09-12 NOTE — Assessment & Plan Note (Signed)
She has chronic back pain and had recent treatment with injection to her back that was not helping with her pain We discussed importance of risk factor modification and weight loss I refilled her prescription of pain medicine today

## 2020-09-12 NOTE — Assessment & Plan Note (Signed)
She is on multiple different medications for diabetes We discussed importance of risk factor modification and aggressive management of diabetes in anticipation that she might need to resume chemotherapy in the future

## 2020-09-12 NOTE — Assessment & Plan Note (Signed)
Her recent liver function test was within normal range CT imaging shows persistent liver cirrhosis and splenomegaly Observe closely

## 2020-09-13 ENCOUNTER — Other Ambulatory Visit: Payer: Self-pay | Admitting: Hematology and Oncology

## 2020-09-20 ENCOUNTER — Other Ambulatory Visit: Payer: Self-pay | Admitting: Licensed Clinical Social Worker

## 2020-09-20 DIAGNOSIS — C482 Malignant neoplasm of peritoneum, unspecified: Secondary | ICD-10-CM

## 2020-09-21 ENCOUNTER — Inpatient Hospital Stay: Payer: Medicare Other

## 2020-09-21 ENCOUNTER — Other Ambulatory Visit: Payer: Self-pay

## 2020-09-21 DIAGNOSIS — C482 Malignant neoplasm of peritoneum, unspecified: Secondary | ICD-10-CM

## 2020-09-21 LAB — GENETIC SCREENING ORDER

## 2020-10-04 ENCOUNTER — Telehealth: Payer: Self-pay | Admitting: Licensed Clinical Social Worker

## 2020-10-04 NOTE — Telephone Encounter (Signed)
Disclosed negative genetic test results for the known familial variant in BRCA2. This was essentially repeat testing for Olivia Benson as she already had this gene looked at several years ago, but out on abundance of caution we looked at the gene again through family variant testing and it was not detected. People in [name]'s family may have an increased risk for ovarian cancer even if they test negative for BRCA2 since we do not know what caused her cancer, and of course other family members who have not had BRCA2 testing should have it.

## 2020-10-23 ENCOUNTER — Other Ambulatory Visit: Payer: Self-pay

## 2020-10-23 ENCOUNTER — Inpatient Hospital Stay: Payer: Medicare Other | Attending: Hematology and Oncology

## 2020-10-23 DIAGNOSIS — D509 Iron deficiency anemia, unspecified: Secondary | ICD-10-CM

## 2020-10-23 DIAGNOSIS — C569 Malignant neoplasm of unspecified ovary: Secondary | ICD-10-CM

## 2020-10-23 DIAGNOSIS — D696 Thrombocytopenia, unspecified: Secondary | ICD-10-CM | POA: Insufficient documentation

## 2020-10-23 DIAGNOSIS — R112 Nausea with vomiting, unspecified: Secondary | ICD-10-CM | POA: Diagnosis not present

## 2020-10-23 DIAGNOSIS — Z9221 Personal history of antineoplastic chemotherapy: Secondary | ICD-10-CM | POA: Insufficient documentation

## 2020-10-23 DIAGNOSIS — C482 Malignant neoplasm of peritoneum, unspecified: Secondary | ICD-10-CM | POA: Diagnosis not present

## 2020-10-23 DIAGNOSIS — M545 Low back pain, unspecified: Secondary | ICD-10-CM | POA: Insufficient documentation

## 2020-10-23 LAB — CBC WITH DIFFERENTIAL/PLATELET
Abs Immature Granulocytes: 0.01 10*3/uL (ref 0.00–0.07)
Basophils Absolute: 0 10*3/uL (ref 0.0–0.1)
Basophils Relative: 0 %
Eosinophils Absolute: 0.1 10*3/uL (ref 0.0–0.5)
Eosinophils Relative: 2 %
HCT: 39.9 % (ref 36.0–46.0)
Hemoglobin: 13.4 g/dL (ref 12.0–15.0)
Immature Granulocytes: 0 %
Lymphocytes Relative: 20 %
Lymphs Abs: 0.9 10*3/uL (ref 0.7–4.0)
MCH: 31.2 pg (ref 26.0–34.0)
MCHC: 33.6 g/dL (ref 30.0–36.0)
MCV: 93 fL (ref 80.0–100.0)
Monocytes Absolute: 0.2 10*3/uL (ref 0.1–1.0)
Monocytes Relative: 5 %
Neutro Abs: 3.5 10*3/uL (ref 1.7–7.7)
Neutrophils Relative %: 73 %
Platelets: 92 10*3/uL — ABNORMAL LOW (ref 150–400)
RBC: 4.29 MIL/uL (ref 3.87–5.11)
RDW: 14.2 % (ref 11.5–15.5)
WBC: 4.7 10*3/uL (ref 4.0–10.5)
nRBC: 0 % (ref 0.0–0.2)

## 2020-10-23 LAB — COMPREHENSIVE METABOLIC PANEL
ALT: 30 U/L (ref 0–44)
AST: 24 U/L (ref 15–41)
Albumin: 4.1 g/dL (ref 3.5–5.0)
Alkaline Phosphatase: 86 U/L (ref 38–126)
Anion gap: 11 (ref 5–15)
BUN: 13 mg/dL (ref 8–23)
CO2: 25 mmol/L (ref 22–32)
Calcium: 9.2 mg/dL (ref 8.9–10.3)
Chloride: 107 mmol/L (ref 98–111)
Creatinine, Ser: 0.86 mg/dL (ref 0.44–1.00)
GFR, Estimated: >60 mL/min (ref >60)
Glucose, Bld: 92 mg/dL (ref 70–99)
Potassium: 4.3 mmol/L (ref 3.5–5.1)
Sodium: 143 mmol/L (ref 135–145)
Total Bilirubin: 1 mg/dL (ref 0.3–1.2)
Total Protein: 7.7 g/dL (ref 6.5–8.1)

## 2020-10-23 LAB — FERRITIN: Ferritin: 171 ng/mL (ref 11–307)

## 2020-10-23 LAB — IRON AND TIBC
Iron: 107 ug/dL (ref 41–142)
Saturation Ratios: 33 % (ref 21–57)
TIBC: 323 ug/dL (ref 236–444)
UIBC: 216 ug/dL (ref 120–384)

## 2020-10-23 MED ORDER — SODIUM CHLORIDE 0.9% FLUSH
10.0000 mL | Freq: Once | INTRAVENOUS | Status: AC
  Administered 2020-10-23: 10 mL

## 2020-10-23 MED ORDER — HEPARIN SOD (PORK) LOCK FLUSH 100 UNIT/ML IV SOLN
500.0000 [IU] | Freq: Once | INTRAVENOUS | Status: AC
  Administered 2020-10-23: 500 [IU]

## 2020-10-24 ENCOUNTER — Telehealth (HOSPITAL_BASED_OUTPATIENT_CLINIC_OR_DEPARTMENT_OTHER): Payer: Medicare Other | Admitting: Hematology and Oncology

## 2020-10-24 ENCOUNTER — Encounter: Payer: Self-pay | Admitting: Hematology and Oncology

## 2020-10-24 DIAGNOSIS — D696 Thrombocytopenia, unspecified: Secondary | ICD-10-CM | POA: Diagnosis not present

## 2020-10-24 DIAGNOSIS — C482 Malignant neoplasm of peritoneum, unspecified: Secondary | ICD-10-CM

## 2020-10-24 DIAGNOSIS — R112 Nausea with vomiting, unspecified: Secondary | ICD-10-CM | POA: Insufficient documentation

## 2020-10-24 DIAGNOSIS — M545 Low back pain, unspecified: Secondary | ICD-10-CM | POA: Diagnosis not present

## 2020-10-24 DIAGNOSIS — G8929 Other chronic pain: Secondary | ICD-10-CM

## 2020-10-24 LAB — CA 125: Cancer Antigen (CA) 125: 35.3 U/mL (ref 0.0–38.1)

## 2020-10-24 MED ORDER — HYDROCODONE-ACETAMINOPHEN 5-325 MG PO TABS: 1.0000 | ORAL_TABLET | Freq: Four times a day (QID) | ORAL | 0 refills | Status: DC | PRN

## 2020-10-24 NOTE — Progress Notes (Signed)
HEMATOLOGY-ONCOLOGY ELECTRONIC VISIT PROGRESS NOTE  Patient Care Team: Jolinda Croak, MD as PCP - General (Family Medicine) Tat, Eustace Quail, DO as Consulting Physician (Neurology)  I connected with by [facility] video conference and verified that I am speaking with the correct person using two identifiers.  I discussed the limitations, risks, security and privacy concerns of performing an evaluation and management service by [facility] and the availability of in person appointments.  I also discussed with the patient that there may be a patient responsible charge related to this service. The patient expressed understanding and agreed to proceed.   ASSESSMENT & PLAN:  Primary peritoneal carcinomatosis ([place]) The patient attributed her symptoms of nausea and constipation to her recent diabetic medications We discussed the difficulties of separating out those symptoms from cancer recurrence Ultimately, she is comfortable to wait until November 1 for repeat imaging study Her tumor marker is stable and the rest of her blood work is unremarkable except for chronic thrombocytopenia The patient is educated to watch out for signs and symptoms of carcinomatosis I recommend she proceed with influenza vaccination whenever possible I will order repeat blood work and imaging study on November 1 and we will see her back on November 3 for further follow-up  Thrombocytopenia ([place]) 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 It is stable She has no recent bleeding Observe closely for now  Low back pain She has chronic back pain  We discussed importance of risk factor modification and weight loss I refilled her prescription of pain medicine today  Nausea with vomiting She attributed to recent constipation and her diabetes medication as the cause of her nausea with vomiting She has antiemetics to take as needed  Orders Placed This Encounter  Procedures   CT ABDOMEN PELVIS W  CONTRAST    Standing Status:   Future    Standing Expiration Date:   10/24/2021    Order Specific Question:   If indicated for the ordered procedure, I authorize the administration of contrast media per Radiology protocol    Answer:   Yes    Order Specific Question:   Preferred imaging location?    Answer:   [facility]    Order Specific Question:   Radiology Contrast Protocol - do NOT remove file path    Answer:   [email or web address]    INTERVAL HISTORY: Please see below for problem oriented charting. 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 Had allergic reaction to carboplatin. Avastin was stopped due to GI hemorrhage   Primary peritoneal carcinomatosis ([place])  09/06/2011 Imaging   CT findings consistent with cirrhotic changes involving the liver.  No worrisome liver mass.  There are associated portal venous collaterals and splenomegaly consistent with portal venous hypertension. 2.  No other significant upper abdominal findings   11/08/2013 Imaging   US showed ascites   11/08/2013 Initial Diagnosis   Patient had ~ 2 months of some  urinary incontinence, saw Dr Ronita Hipps in 10-2013, with pelvic mass on exam    11/12/2013 Imaging   There are findings of progressive hepatic cirrhosis with a large amount of ascites and mild splenomegaly. There are venous collaterals present. 2. There is abnormal  thickening of the peritoneal surface along the left mid and lower abdominal wall. There is abnormal soft tissue density in the pelvis as well which could reflect the clinically suspected ovarian malignancy but the findings are nondiagnostic. 3. There is a new small left pleural effusion. 4. There is no acute bowel abnormality.   11/18/2013 Imaging   Successful ultrasound guided diagnostic and therapeutic paracentesis yielding 4.1 liters of ascites.     11/18/2013 Pathology Results   PERITONEAL/ASCITIC FLUID (SPECIMEN 1 OF 1 COLLECTED 11/18/13): SEROUS CARCINOMA, PLEASE SEE COMMENT.   12/01/2013 Tumor Marker   Patient's tumor was tested for the following markers: CA125 Results of the tumor marker test revealed 1938   12/03/2013 Imaging   Successful ultrasound-guided therapeutic paracentesis yielding 3.6 liters of peritoneal fluid.   12/07/2013 - 05/30/2014 Chemotherapy   She received 3 cycles of carboplatin and Taxol, interrupted cycle 4 for surgery.  She subsequently completed 3 more cycles of chemotherapy after surgery   12/20/2013 Imaging   Successful ultrasound-guided diagnostic and therapeutic paracentesis yielding 2.2 liters of peritoneal fluid   12/20/2013 Pathology Results   PERITONEAL/ASCITIC FLUID(SPECIMEN 1 OF 1 COLLECTED 12/20/13): MALIGNANT CELLS CONSISTENT WITH SEROUS CARCINOMA   01/13/2014 Tumor Marker   Patient's tumor was tested for the following markers: CA125 Results of the tumor marker test revealed 227   02/07/2014 Tumor Marker   Patient's tumor was tested for the following markers: CA125 Results of the tumor marker test revealed 130   02/15/2014 Genetic Testing   Genetics testing from 02-2014 normal ([employer] panel)   02/23/2014 Imaging   Interval decrease in ascites. 2. Morphologic changes in the liver consistent with cirrhosis. 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 Portal vein remains patent at this time. 3. Persistent but  decreased abnormal soft tissue attenuation tracking in the omental fat. This may be secondary to interval improvement in metastatic disease. 4. Peritoneal thickening seen in the left abdomen and pelvis on the previous study has decreased and nearly resolved in the interval. This also suggests interval improvement in metastatic disease.     03/07/2014 Procedure   Ultrasound and fluoroscopically guided right internal jugular single lumen power port catheter insertion. Tip in the SVC/RA junction. Catheter ready for use.   03/17/2014 Tumor Marker   Patient's tumor was tested for the following markers: CA125 Results of the tumor marker test revealed 45   03/29/2014 Pathology Results   1. Omentum, resection for tumor - HIGH GRADE SEROUS CARCINOMA, SEE COMMENT. 2. Ovary and fallopian tube, right - HIGH GRADE SEROUS CARCINOMA, SEE COMMENT. 3. Ovary and fallopian tube, left - HIGH GRADE SEROUS CARCINOMA, SEE COMMENT. Diagnosis Note 1. Nests and clusters of malignant cells are invading the omental tissue (part #1) with associated fibrosis. The cells are pleomorphic with prominent nucleoli. There are scattered psammoma bodies. The ovaries are atrophic and exhibit multiple foci of surface based invasive carcinoma and associated fibrosis. The fallopian tubes have a few foci of carcinoma, also superficially located. There are no precursor lesions noted in the ovaries or fallopian tubes (the entire tubes and ovaries were submitted for evaluation). While there is some retraction artifact, there are several foci suspicious for lymphovascular invasion. 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 Given the fibrosis, there does appear to be a small amount of treatment effect, however, there is abundant residual tumor.    03/29/2014 Surgery   Procedure(s) Performed: 1. Exploratory  laparotomy with bilateral salpingo-oophorectomy, omentectomy radical tumor debulking for ovarian cancer .   Surgeon: Thereasa Solo, MD.   Assistant Surgeon: Lahoma Crocker, M.D. Assistant: (an MD assistant was necessary for tissue manipulation, retraction and positioning due to the complexity of the case and hospital policies).  Operative Findings: 10cm omental cake from hepatic to splenic flexure, densely adherent to transverse colon. Milliary studding of tumor implants (<49m, too numerous in number to count) adherent to the mesentery of the small bowel and small bowel wall. Small volume (100cc) ascites. Small ovaries bilaterally, densely adherent to the pelvic cul de sac. Left ovary and tube densely adherent to sigmoid colon. Cirrhotic liver with hepatomegaly and splenomegaly.     This represented an optimal cytoreduction (R1) with visible disease residual on the bowel wall and mesentery (millial, <340mimplants).      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 2. 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     08/18/2014 Tumor Marker   Patient's tumor was tested for the following markers: CA125 Results of the tumor marker test revealed 16   10/13/2014 Tumor Marker   Patient's tumor was tested for the following markers: CA125 Results of the tumor marker test revealed 14   11/21/2014 Tumor Marker   Patient's tumor was tested for the following markers: CA125 Results of the tumor marker test revealed 16    12/28/2014 Tumor Marker   Patient's tumor was tested for the following markers: CA125 Results of the tumor marker test revealed 762   02/27/2015 Tumor Marker   Patient's tumor was tested for the following markers: CA125 Results of the tumor marker test revealed 20.2   03/22/2015 Imaging   Filler appearance to the prior exam, with very subtle fluid tracking along portions of the liver, and very minimal nodularity along the right paracolic gutter representing residua from the prior peritoneal cancer. The current abnormalities could simply be post therapy findings rather than necessarily representing residual malignancy. No new or enlarging lesions are identified. 2. Hepatic cirrhosis and splenomegaly. There is some gastric varices suggesting portal venous hypertension. 3. Left foraminal stenosis at L4-5 due to spurring. There is likely also some central narrowing of the thecal sac at this level. 4. Bibasilar scarring. 5. Chronic mild left mid kidney scarring   03/27/2015 Tumor Marker   Patient's tumor was tested for the following markers: CA125 Results of the tumor marker test revealed 25.3   04/24/2015 Imaging   Disc bulge L2-3 with mild spinal stenosis and right foraminal encroachment. 2. Disc bulge L3-4 with mild spinal stenosis. 3. Multifactorial spinal, left lateral recess and foraminal stenosis L4-5. There is grade 1 anterolisthesis without evident dynamic instability.   05/29/2015 Tumor Marker  Patient's tumor was tested for the following markers: CA125 Results of the tumor marker test revealed 29.9   08/21/2015 Tumor Marker   Patient's tumor was tested for the following markers: CA125 Results of the tumor marker test revealed 45   09/18/2015 Tumor Marker   Patient's tumor was tested for the following markers: CA125 Results of the tumor marker test revealed 47   09/27/2015 Imaging   New small bowel mesenteric nodule and enlarged left external iliac lymph node, highly worrisome for  metastatic disease. 2. Cirrhosis and splenomegaly   10/10/2015 Tumor Marker   Patient's tumor was tested for the following markers: CA125 Results of the tumor marker test revealed 53.4   10/10/2015 - 12/11/2015 Chemotherapy   She received 4 cycles of carboplatin only   11/20/2015 Tumor Marker   Patient's tumor was tested for the following markers: CA125 Results of the tumor marker test revealed 41.3   12/20/2015 Imaging   CT: Mild left external iliac lymphadenopathy is slightly decreased. Mildly enlarged lower mesenteric nodule is slightly decreased. 2. No new or progressive metastatic disease in the abdomen or pelvis. 3. Cirrhosis. Stable subcentimeter hypodense left liver lobe lesion. No new liver lesions. 4. Stable mild splenomegaly.  No ascites. 5. Mild sigmoid diverticulosis.   01/01/2016 -  Anti-estrogen oral therapy   She was placed on tamoxifen. Had letrozole initially but switched to tamoxifen due to poor tolerance   01/15/2016 Tumor Marker   Patient's tumor was tested for the following markers: CA125 Results of the tumor marker test revealed 31.4   02/19/2016 Tumor Marker   Patient's tumor was tested for the following markers: CA125 Results of the tumor marker test revealed 36.5   05/13/2016 Imaging   No evidence of disease progression within the abdomen or pelvis. Previously noted small central mesenteric nodule and left external iliac node appear slightly smaller. 2. Stable changes of hepatic cirrhosis and portal hypertension with associated splenomegaly. No new or enlarging hepatic lesions are identified. 3. No acute findings.   05/13/2016 Tumor Marker   Patient's tumor was tested for the following markers: CA125 Results of the tumor marker test revealed 41.2   06/24/2016 Tumor Marker   Patient's tumor was tested for the following markers: CA125 Results of the tumor marker test revealed 47.3   08/20/2016 Imaging   1. The left external iliac lymph node is slightly increased in  size compared to the prior exam, currently 1.1 cm and previously 0.9 cm in diameter. 2. Stable central mesenteric lymph node at 0.9 cm in diameter. 3. Stable trace free pelvic fluid and stable slight thickening along the right paracolic gutter, without well-defined peritoneal nodularity. 4. Other imaging findings of potential clinical significance: Subsegmental atelectasis or scarring in the lung bases. Hepatic cirrhosis. Left renal scarring. Mild splenomegaly. Sigmoid colon diverticulosis. Impingement at L4-5 due to spondylosis and degenerative disc disease broad Schmorl' s nodes at L3-L4. Pelvic floor laxity   08/23/2016 Tumor Marker   Patient's tumor was tested for the following markers: CA125 Results of the tumor marker test revealed 80.2   09/04/2016 Imaging   LV EF: 60% -   65%   09/12/2016 Tumor Marker   Patient's tumor was tested for the following markers: CA125 Results of the tumor marker test revealed 98.5   09/12/2016 - 01/29/2018 Chemotherapy   The patient received Doxil and Avastin. Avastin was discontinued in Dec 2019 due to GI hemorrhage   09/26/2016 Tumor Marker   Patient's tumor was tested for the following markers: CA125  Results of the tumor marker test revealed 68.9   10/10/2016 Tumor Marker   Patient's tumor was tested for the following markers: CA125 Results of the tumor marker test revealed 65.6   11/07/2016 Tumor Marker   Patient's tumor was tested for the following markers: CA125 Results of the tumor marker test revealed 46.4   11/29/2016 Imaging   Stable mild peritoneal thickening, suspicious for peritoneal carcinomatosis. No new or progressive disease identified. No evidence of ascites.  No significant change in 10 mm left external iliac and 8 mm mesenteric lymph nodes.  Stable hepatic cirrhosis, and splenomegaly consistent with portal venous hypertension.  Colonic diverticulosis, without radiographic evidence of diverticulitis.   12/02/2016 Imaging   Normal  LV size with EF 55%. Basal inferior and basal inferoseptal hypokinesis. Normal RV size and systolic function. No significant valvular abnormalities.   12/05/2016 Tumor Marker   Patient's tumor was tested for the following markers: CA125 Results of the tumor marker test revealed 49.1   01/09/2017 Tumor Marker   Patient's tumor was tested for the following markers: CA125 Results of the tumor marker test revealed 47.2   02/06/2017 Tumor Marker   Patient's tumor was tested for the following markers: CA125 Results of the tumor marker test revealed 44.1   02/18/2017 Imaging   Stable mild peritoneal thickening. No new or progressive disease identified within the abdomen or pelvis.  Stable tiny sub-cm left external iliac and mesenteric lymph nodes.  Stable hepatic cirrhosis, and splenomegaly consistent with portal venous hypertension. No evidence of hepatic neoplasm.  Colonic diverticulosis, without radiographic evidence of diverticulitis.   03/06/2017 Tumor Marker   Patient's tumor was tested for the following markers: CA125 Results of the tumor marker test revealed 50.4   03/17/2017 Imaging   - Left ventricle: The cavity size was normal. Wall thickness was normal. Systolic function was normal. The estimated ejection fraction was in the range of 55% to 60%. Wall motion was normal; there were no regional wall motion abnormalities. Features are consistent with a pseudonormal left ventricular filling pattern, with concomitant abnormal relaxation and increased filling pressure (grade 2 diastolic dysfunction). - Mitral valve: There was mild regurgitation. - Left atrium: The atrium was mildly dilated   04/03/2017 Tumor Marker   Patient's tumor was tested for the following markers: CA125 Results of the tumor marker test revealed 47.2   05/14/2017 Imaging   CT scan of abdomen and pelvis Stable mild peritoneal thickening. No new or progressive disease within the abdomen or pelvis.  Stable hepatic  cirrhosis. Stable splenomegaly, consistent with portal venous hypertension. No evidence of hepatic neoplasm.   05/15/2017 Tumor Marker   Patient's tumor was tested for the following markers: CA125 Results of the tumor marker test revealed 56.1   06/12/2017 Tumor Marker   Patient's tumor was tested for the following markers: CA125 Results of the tumor marker test revealed 49.2   07/10/2017 Tumor Marker   Patient's tumor was tested for the following markers: CA125 Results of the tumor marker test revealed 46.3   08/07/2017 Tumor Marker   Patient's tumor was tested for the following markers: CA125 Results of the tumor marker test revealed 52.9   08/20/2017 Imaging   1. Mild omental/peritoneal haziness, unchanged. No evidence of new metastatic disease. 2. Cirrhosis with splenomegaly.   09/04/2017 Tumor Marker   Patient's tumor was tested for the following markers: CA125 Results of the tumor marker test revealed 48.5   11/13/2017 Tumor Marker   Patient's tumor was tested for the following  markers: CA125 Results of the tumor marker test revealed 55.8   12/17/2017 Imaging   1. No findings to suggest metastatic disease in the abdomen or pelvis. 2. Severe hepatic cirrhosis with evidence of portal hypertension, as demonstrated by dilated portal vein, splenomegaly and portosystemic collateral pathways, including gastric and esophageal varices.  3. Colonic diverticulosis without evidence of acute diverticulitis at this time. 4. Additional incidental findings, as above.   01/16/2018 Tumor Marker   Patient's tumor was tested for the following markers: CA125 Results of the tumor marker test revealed 63.7   02/10/2018 Procedure   EGD - Recently bleeding grade III and large (> 5 mm) esophageal varices. Completely eradicated. Banded. - Portal hypertensive gastropathy. - Normal examined duodenum. - No specimens collected   02/13/2018 Tumor Marker   Patient's tumor was tested for the following  markers: CA125 Results of the tumor marker test revealed 82.7   02/25/2018 Imaging   Bone density showed mild osteopenia   04/08/2018 Tumor Marker   Patient's tumor was tested for the following markers: CA125 Results of the tumor marker test revealed 82.7   04/08/2018 Imaging   1. No definite omental or peritoneal surface lesions. However, there are 2 slowly enlarging lymph nodes noted. One is in the small bowel mesentery and the other is in the left deep pelvis. Could not exclude recurrent disease. 2. Stable advanced cirrhotic changes involving the liver with portal venous hypertension, portal venous collaterals, esophageal varices and splenomegaly. No worrisome hepatic lesions. 3. Diffuse wall thickening of the colon could suggest diffuse colitis or could be due to low albumin. Recommend correlation with any symptoms such as diarrhea.   05/21/2018 Tumor Marker   Patient's tumor was tested for the following markers: CA125 Results of the tumor marker test revealed 86   09/07/2018 Tumor Marker   Patient's tumor was tested for the following markers:CA-125 Results of the tumor marker test revealed 111   09/29/2018 Tumor Marker   Patient's tumor was tested for the following markers: CA-125 Results of the tumor marker test revealed 121.   09/29/2018 - 11/24/2018 Chemotherapy   The patient had carboplatin and gemzar for chemotherapy treatment.  Chemo was stopped due to allergic reaction to carboplatin   10/27/2018 Tumor Marker   Patient's tumor was tested for the following markers: CA-125 Results of the tumor marker test revealed 99.9   11/24/2018 Tumor Marker   Patient's tumor was tested for the following markers: CA-125 Results of the tumor marker test revealed 81.2   12/14/2018 Tumor Marker   Patient's tumor was tested for the following markers: CA-125 Results of the tumor marker test revealed 81.2   12/14/2018 Imaging   1. Mild improvement in peritoneal disease. Peritoneal nodule  within the left posterior pelvis contiguous with the left side of vaginal cuff is slightly decreased in size in the interval. Within the right iliac fossa there is a small peritoneal nodule which has decreased in size in the interval. Central small bowel mesenteric lymph node is not significantly changed. Slight decrease in size left periaortic lymph node.    2. Morphologic features of the liver compatible with cirrhosis. Splenomegaly is noted which may reflect portal venous hypertension.   12/21/2018 Tumor Marker   Patient's tumor was tested for the following markers: CA-125 Results of the tumor marker test revealed 64.2   12/21/2018 -  Chemotherapy   The patient had taxotere for chemotherapy treatment.     03/02/2019 Tumor Marker   Patient's tumor was tested for the  following markers: CA-125. 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   No new or progressive metastatic disease within the abdomen or pelvis.   Hepatic cirrhosis and findings of portal venous hypertension. 1.1 cm low-attenuation lesion within left hepatic lobe, which could represent a regenerative nodule or small hepatocellular carcinoma. Recommend abdomen MRI without and with contrast for further characterization.     04/13/2019 Tumor Marker   Patient's tumor was tested for the following markers: CA-125 Results of the tumor marker test revealed 36.8   05/26/2019 Tumor Marker   Patient's tumor was tested for the following markers: CA-125 Results of the tumor marker test revealed 31.6.   06/15/2019 Tumor Marker   Patient's tumor was tested for the following markers: CA-125 Results of the tumor marker test revealed 25.4   07/05/2019 Imaging   1. Stable CT examination. Stable peritoneal thickening in the pelvis and paracolic gutters. Stable solid left deep pelvic implant. Stable trace ascites. No new or progressive metastatic  disease. 2. Stable cirrhosis. No discrete liver masses. 3. Stable mild-to-moderate splenomegaly. 4. Aortic Atherosclerosis (ICD10-I70.0).   07/27/2019 Tumor Marker   Patient's tumor was tested for the following markers: CA-125 Results of the tumor marker test revealed 35.4   08/17/2019 Tumor Marker   Patient's tumor was tested for the following markers: Ca-125 Results of the tumor marker test revealed 35.7.   09/17/2019 - 09/21/2019 Hospital Admission   She was admitted due to esophageal variceal bleeding presents with hematemesis and melanotic stool with epigastric pain.  GI consulted, started on octreotide infusion.  Underwent EGD which showed minimal bleeding, remain on octreotide drip, PPI drip.  Hemoglobin trended down therefore received PRBC transfusion.  Over several days her bleeding subsided, hemodynamically remained stable.    09/17/2019 Procedure   EGD report - Esophageal mucosal scarring, from prior esophageal banding sessions. - Grade I esophageal varices. - Red blood in the cardia and in the gastric fundus. - Portal hypertensive gastropathy. - Normal duodenal bulb, first portion of the duodenum and second portion of the duodenum. 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 2. Otherwise stable CT exam. Stable peritoneal thickening within the bilateral deep pelvic peritoneum. Stable left deep pelvic implant.No evidence for progressed disease. 3. Stable splenomegaly. 4. Morphologic changes to the liver compatible with cirrhosis. Trace perihepatic and pelvic ascites.   02/22/2020 Imaging   1.  New lucent lesion in the left side of the L4 vertebral body measuring 1 cm, concerning for possible osseous metastasis. Consideration for further evaluation with bone scan is suggested. Close attention on follow-up imaging is also recommended to ensure stability. 2. Previously noted peritoneal thickening and nodularity in the low anatomic pelvis has regressed slightly. 3. Borderline enlarged and mildly enlarged pelvic and retroperitoneal lymph nodes appear similar to slightly increased compared to the prior examination, as above. Continued attention on follow-up studies is recommended.  4. 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 5. Aortic atherosclerosis. 6. Additional incidental findings, as above.   02/22/2020 Tumor Marker   Patient's tumor was tested for  the following markers: CA-125 Results of the tumor marker test revealed 30.1   04/06/2020 Tumor Marker   Patient's tumor was tested for the following markers: CA-125 Results of the tumor marker test revealed 35.7   09/11/2020 Tumor Marker   Patient's tumor was tested for the following markers: CA-125. Results of the tumor marker test revealed 26.6.   09/12/2020 Imaging   1. Slow enlargement of a currently 2.9 cubic cm soft tissue nodule along the left adnexa. Tumor deposit not excluded. 2. The left external iliac lymph node has substantially reduced in size, currently 0.9 cm in short axis and formerly 1.4 cm. 3. 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 4. Cirrhosis with portal venous hypertension. 5. Mild distal esophageal wall thickening, probably from esophagitis. Splenomegaly. 6. Scarring in the left kidney. 7. Sigmoid colon diverticulosis. 8. Pelvic floor laxity with small cystocele. 9. Lumbar impingement at L2-3, L3-4, and L4-5.     10/23/2020  Tumor Marker   Patient's tumor was tested for the following markers: CA-125. Results of the tumor marker test revealed 35.3.     REVIEW OF SYSTEMS:   Constitutional: Denies fevers, chills  Eyes: Denies blurriness of vision Ears, nose, mouth, throat, and face: Denies mucositis or sore throat Respiratory: Denies cough, dyspnea or wheezes Cardiovascular: Denies palpitation, chest discomfort Skin: Denies abnormal skin rashes Lymphatics: Denies new lymphadenopathy or easy bruising Neurological:Denies numbness, tingling or new weaknesses Behavioral/Psych: Mood is stable, no new changes  Extremities: No lower extremity edema All other systems were reviewed with the patient and are negative.  I have reviewed the past medical history, past surgical history, social history and family history with the patient and they are unchanged from previous note.  ALLERGIES:  is allergic to carboplatin, shellfish allergy, vancomycin, benadryl [diphenhydramine hcl], and penicillins.  MEDICATIONS:  Current Outpatient Medications  Medication Sig Dispense Refill   calcium elemental as carbonate (BARIATRIC TUMS ULTRA) 400 MG chewable tablet Chew 1 tablet by mouth 2 (two) times daily.     Cholecalciferol (VITAMIN D) 50 MCG (2000 UT) tablet Take 2,000 Units by mouth at bedtime.     gabapentin (NEURONTIN) 300 MG capsule TAKE 3 CAPSULES(900 MG) BY MOUTH TWICE DAILY 120 capsule 11   HYDROcodone-acetaminophen (NORCO/VICODIN) 5-325 MG tablet Take 1 tablet by mouth every 6 (six) hours as needed for moderate pain. 60 tablet 0   insulin degludec (TRESIBA) 200 UNIT/ML FlexTouch Pen Inject into the skin. (Patient not taking: Reported on 09/12/2020)     Insulin Degludec 100 UNIT/ML SOLN Inject 30 Units into the skin daily.     lidocaine-prilocaine (EMLA) cream Apply to Porta-cath  1-2 hours prior to access as directed. (Patient taking differently: Apply 1 application topically daily as needed (port access).) 30 g 9   nadolol  (CORGARD) 20 MG tablet Take 1 tablet (20 mg total) by mouth daily. (Patient taking differently: Take 20 mg by mouth at bedtime.) 90 tablet 3   ondansetron (ZOFRAN) 8 MG tablet TAKE 1 TABLET(8 MG) BY MOUTH EVERY 8 HOURS AS NEEDED 60 tablet 1   pantoprazole (PROTONIX) 40 MG tablet Take 1 tablet (40 mg total) by mouth daily.     prochlorperazine (COMPAZINE) 10 MG tablet Take 1 tablet (10 mg total) by mouth every 6 (six) hours as needed (Nausea or vomiting). 30 tablet 1   rotigotine (NEUPRO) 4 MG/24HR APPLY 1 PATCH TOPICALLY TO THE SKIN DAILY 90 patch 0  Semaglutide,0.25 or 0.5MG/DOS, (OZEMPIC, 0.25 OR 0.5 MG/DOSE,) 2 MG/1.5ML SOPN Inject 0.5 mg into the skin once a week.     SEMAGLUTIDE,0.25 OR 0.5MG/DOS, [place] Inject 0.25 mg into the skin once a week.     No current facility-administered medications for this visit.    PHYSICAL EXAMINATION: ECOG PERFORMANCE STATUS: 1 - Symptomatic but completely ambulatory  LABORATORY DATA:  I have reviewed the data as listed CMP Latest Ref Rng & Units 10/23/2020 09/11/2020 05/22/2020  Glucose 70 - 99 mg/dL 92 81 129(H)  BUN 8 - 23 mg/dL 13 21 22   Creatinine 0.44 - 1.00 mg/dL 0.86 1.02(H) 0.90  Sodium 135 - 145 mmol/L 143 143 142  Potassium 3.5 - 5.1 mmol/L 4.3 4.4 4.2  Chloride 98 - 111 mmol/L 107 107 107  CO2 22 - 32 mmol/L 25 27 24   Calcium 8.9 - 10.3 mg/dL 9.2 9.3 8.8(L)  Total Protein 6.5 - 8.1 g/dL 7.7 7.8 7.6  Total Bilirubin 0.3 - 1.2 mg/dL 1.0 0.8 0.7  Alkaline Phos 38 - 126 U/L 86 100 123  AST 15 - 41 U/L 24 25 25   ALT 0 - 44 U/L 30 34 29    Lab Results  Component Value Date   WBC 4.7 10/23/2020   HGB 13.4 10/23/2020   HCT 39.9 10/23/2020   MCV 93.0 10/23/2020   PLT 92 (L) 10/23/2020   NEUTROABS 3.5 10/23/2020    I discussed the assessment and treatment plan with the patient. The patient was provided an opportunity to ask questions and all were answered. The patient agreed with the plan and demonstrated an understanding of the instructions.  The patient was advised to call back or seek an in-person evaluation if the symptoms worsen or if the condition fails to improve as anticipated.    I spent 20 minutes for the appointment reviewing test results, discuss management and coordination of care.  [name], MD 10/24/2020 8:15 AM

## 2020-10-24 NOTE — Assessment & Plan Note (Signed)
The patient attributed her symptoms of nausea and constipation to her recent diabetic medications We discussed the difficulties of separating out those symptoms from cancer recurrence Ultimately, she is comfortable to wait until November 1 for repeat imaging study Her tumor marker is stable and the rest of her blood work is unremarkable except for chronic thrombocytopenia The patient is educated to watch out for signs and symptoms of carcinomatosis I recommend she proceed with influenza vaccination whenever possible I will order repeat blood work and imaging study on November 1 and we will see her back on November 3 for further follow-up

## 2020-10-24 NOTE — Assessment & Plan Note (Signed)
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 It is stable She has no recent bleeding Observe closely for now

## 2020-10-24 NOTE — Assessment & Plan Note (Signed)
She has chronic back pain  We discussed importance of risk factor modification and weight loss I refilled her prescription of pain medicine today

## 2020-10-24 NOTE — Assessment & Plan Note (Signed)
She attributed to recent constipation and her diabetes medication as the cause of her nausea with vomiting She has antiemetics to take as needed

## 2020-11-24 NOTE — Progress Notes (Signed)
Assessment/Plan:   1.  Restless leg secondary to iron deficiency anemia  -Continue rotigotine patch, 4 mg daily.  -Patient following closely with Dr. Alvy Bimler and her ferritin is very normal at 171. 2.  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  Pancytopenia  -Chemotherapy-induced. 4.  History of small subdural hematoma in November, 2020 after a fall  -Nothing further to do.  Has seen Dr. Saintclair Halsted. 5.  Chronic LBP  -handicap sticker filled out, although equally disabled from [place] Subjective:   Olivia Benson was seen today in follow up for RLS.  My previous records as well as any outside records available were reviewed prior to todays visit.  Pt is currently on neupro, 4 mg.  Patient just saw Dr. Alvy Bimler September 13.  Patient is stable from her aspect.  She had negative genetics.  She just had a ferritin level done on September 12 and it was 171.  Generally, RLS is well controlled on medication but she does have up and down days.  More trouble ambulating due to ortho issues and chronic LBP.  PREVIOUS MEDICATIONS:  Neupro, 17m  CURRENT MEDICATIONS:  Outpatient Encounter Medications as of 11/27/2020  Medication Sig   calcium elemental as carbonate (BARIATRIC TUMS ULTRA) 400 MG chewable tablet Chew 1 tablet by mouth 2 (two) times daily.   Cholecalciferol (VITAMIN D) 50 MCG (2000 UT) tablet Take 2,000 Units by mouth at bedtime.   gabapentin (NEURONTIN) 300 MG capsule TAKE 3 CAPSULES(900 MG) BY MOUTH TWICE DAILY (Patient taking differently: 2 (two) times daily.)   Insulin Degludec 100 UNIT/ML SOLN Inject 30 Units into the skin daily.   lidocaine-prilocaine (EMLA) cream Apply to Porta-cath  1-2 hours prior to access as directed. (Patient taking differently: Apply 1 application topically daily as needed (port access).)   ondansetron (ZOFRAN) 8 MG tablet TAKE 1 TABLET(8 MG) BY MOUTH EVERY 8 HOURS AS NEEDED   pantoprazole (PROTONIX) 40 MG tablet Take 1 tablet (40 mg total) by  mouth daily.   prochlorperazine (COMPAZINE) 10 MG tablet Take 1 tablet (10 mg total) by mouth every 6 (six) hours as needed (Nausea or vomiting).   rotigotine (NEUPRO) 4 MG/24HR APPLY 1 PATCH TOPICALLY TO THE SKIN DAILY   Semaglutide,0.25 or 0.5MG/DOS, (OZEMPIC, 0.25 OR 0.5 MG/DOSE,) 2 MG/1.5ML SOPN Inject 0.5 mg into the skin once a week.   SEMAGLUTIDE,0.25 OR 0.5MG/DOS, [place] Inject 0.25 mg into the skin once a week.   HYDROcodone-acetaminophen (NORCO/VICODIN) 5-325 MG tablet Take 1 tablet by mouth every 6 (six) hours as needed for moderate pain.   [DISCONTINUED] insulin degludec (TRESIBA) 200 UNIT/ML FlexTouch Pen Inject into the skin. (Patient not taking: Reported on 11/27/2020)   [DISCONTINUED] nadolol (CORGARD) 20 MG tablet Take 1 tablet (20 mg total) by mouth daily. (Patient taking differently: Take 20 mg by mouth at bedtime.)   No facility-administered encounter medications on file as of 11/27/2020.     Objective:   PHYSICAL EXAMINATION:    VITALS:   Vitals:   11/27/20 0807  BP: 123/72  Pulse: 69  SpO2: 99%  Weight: 186 lb 9.6 oz (84.6 kg)  Height: 5' 1"  (1.549 m)     GEN:  The patient appears stated age and is in NAD. HEENT:  Normocephalic, atraumatic.  The mucous membranes are moist. The superficial temporal arteries are without ropiness or tenderness.   Neurological examination:  Orientation: The patient is alert and oriented x3. Cranial nerves: There is good facial symmetry.The speech is fluent  and clear. Soft palate rises symmetrically and there is no tongue deviation. Hearing is intact to conversational tone. Sensation: Sensation is intact to light touch throughout Motor: Strength is at least antigravity x4.  Movement examination: Tone: There is normal tone in the UE/LE Abnormal movements:  no tremor.  No myoclonus.  No asterixis.   Coordination:  There is no decremation with RAM's. Gait and Station: The patient pushes off to arise.  She is antalgic.  I have  reviewed and interpreted the following labs independently   Chemistry      Component Value Date/Time   NA 143 10/23/2020 0742   NA 142 02/06/2017 0742   K 4.3 10/23/2020 0742   K 3.9 02/06/2017 0742   CL 107 10/23/2020 0742   CO2 25 10/23/2020 0742   CO2 22 02/06/2017 0742   BUN 13 10/23/2020 0742   BUN 13.5 02/06/2017 0742   CREATININE 0.86 10/23/2020 0742   CREATININE 0.85 10/26/2019 0722   CREATININE 0.8 02/06/2017 0742      Component Value Date/Time   CALCIUM 9.2 10/23/2020 0742   CALCIUM 8.5 02/06/2017 0742   ALKPHOS 86 10/23/2020 0742   ALKPHOS 130 02/06/2017 0742   AST 24 10/23/2020 0742   AST 24 10/26/2019 0722   AST 47 (H) 02/06/2017 0742   ALT 30 10/23/2020 0742   ALT 17 10/26/2019 0722   ALT 54 02/06/2017 0742   BILITOT 1.0 10/23/2020 0742   BILITOT 0.7 10/26/2019 0722   BILITOT 0.65 02/06/2017 0742     Lab Results  Component Value Date   WBC 4.7 10/23/2020   HGB 13.4 10/23/2020   HCT 39.9 10/23/2020   MCV 93.0 10/23/2020   PLT 92 (L) 10/23/2020   Lab Results  Component Value Date   FERRITIN 171 10/23/2020     Total time spent on today's visit was 20 minutes, including both face-to-face time and nonface-to-face time.  Time included that spent on review of records (prior notes available to me/labs/imaging if pertinent), discussing treatment and goals, answering patient's questions and coordinating care.  Cc:  Jolinda Croak, MD

## 2020-11-27 ENCOUNTER — Other Ambulatory Visit: Payer: Self-pay

## 2020-11-27 ENCOUNTER — Ambulatory Visit (INDEPENDENT_AMBULATORY_CARE_PROVIDER_SITE_OTHER): Payer: Medicare Other | Admitting: Neurology

## 2020-11-27 ENCOUNTER — Encounter: Payer: Self-pay | Admitting: Neurology

## 2020-11-27 VITALS — BP 123/72 | HR 69 | Ht 61.0 in | Wt 186.6 lb

## 2020-11-27 DIAGNOSIS — G2581 Restless legs syndrome: Secondary | ICD-10-CM | POA: Diagnosis not present

## 2020-11-27 DIAGNOSIS — M545 Low back pain, unspecified: Secondary | ICD-10-CM | POA: Diagnosis not present

## 2020-11-27 DIAGNOSIS — G8929 Other chronic pain: Secondary | ICD-10-CM

## 2020-11-27 MED ORDER — NEUPRO 4 MG/24HR TD PT24: MEDICATED_PATCH | TRANSDERMAL | 3 refills | Status: DC

## 2020-12-12 ENCOUNTER — Other Ambulatory Visit: Payer: Self-pay

## 2020-12-12 ENCOUNTER — Ambulatory Visit (HOSPITAL_COMMUNITY)
Admission: RE | Admit: 2020-12-12 | Discharge: 2020-12-12 | Disposition: A | Discharge status: Home / Self Care | Payer: Medicare Other | Source: Ambulatory Visit | Attending: Hematology and Oncology | Admitting: Hematology and Oncology

## 2020-12-12 ENCOUNTER — Other Ambulatory Visit: Payer: Medicare Other

## 2020-12-12 ENCOUNTER — Inpatient Hospital Stay: Payer: Medicare Other | Attending: Hematology and Oncology

## 2020-12-12 ENCOUNTER — Encounter (HOSPITAL_COMMUNITY): Payer: Self-pay

## 2020-12-12 DIAGNOSIS — C482 Malignant neoplasm of peritoneum, unspecified: Secondary | ICD-10-CM

## 2020-12-12 DIAGNOSIS — C569 Malignant neoplasm of unspecified ovary: Secondary | ICD-10-CM

## 2020-12-12 DIAGNOSIS — G8929 Other chronic pain: Secondary | ICD-10-CM | POA: Insufficient documentation

## 2020-12-12 DIAGNOSIS — Z9221 Personal history of antineoplastic chemotherapy: Secondary | ICD-10-CM | POA: Insufficient documentation

## 2020-12-12 DIAGNOSIS — R161 Splenomegaly, not elsewhere classified: Secondary | ICD-10-CM | POA: Insufficient documentation

## 2020-12-12 DIAGNOSIS — K7581 Nonalcoholic steatohepatitis (NASH): Secondary | ICD-10-CM | POA: Insufficient documentation

## 2020-12-12 DIAGNOSIS — D509 Iron deficiency anemia, unspecified: Secondary | ICD-10-CM

## 2020-12-12 DIAGNOSIS — K7460 Unspecified cirrhosis of liver: Secondary | ICD-10-CM | POA: Insufficient documentation

## 2020-12-12 DIAGNOSIS — M545 Low back pain, unspecified: Secondary | ICD-10-CM | POA: Insufficient documentation

## 2020-12-12 DIAGNOSIS — E1140 Type 2 diabetes mellitus with diabetic neuropathy, unspecified: Secondary | ICD-10-CM | POA: Insufficient documentation

## 2020-12-12 DIAGNOSIS — D696 Thrombocytopenia, unspecified: Secondary | ICD-10-CM | POA: Insufficient documentation

## 2020-12-12 LAB — COMPREHENSIVE METABOLIC PANEL
ALT: 26 U/L (ref 0–44)
AST: 22 U/L (ref 15–41)
Albumin: 3.8 g/dL (ref 3.5–5.0)
Alkaline Phosphatase: 84 U/L (ref 38–126)
Anion gap: 8 (ref 5–15)
BUN: 16 mg/dL (ref 8–23)
CO2: 27 mmol/L (ref 22–32)
Calcium: 8.9 mg/dL (ref 8.9–10.3)
Chloride: 106 mmol/L (ref 98–111)
Creatinine, Ser: 1.14 mg/dL — ABNORMAL HIGH (ref 0.44–1.00)
GFR, Estimated: 51 mL/min — ABNORMAL LOW (ref >60)
Glucose, Bld: 92 mg/dL (ref 70–99)
Potassium: 4.4 mmol/L (ref 3.5–5.1)
Sodium: 141 mmol/L (ref 135–145)
Total Bilirubin: 0.8 mg/dL (ref 0.3–1.2)
Total Protein: 7.4 g/dL (ref 6.5–8.1)

## 2020-12-12 LAB — CBC WITH DIFFERENTIAL/PLATELET
Abs Immature Granulocytes: 0.01 10*3/uL (ref 0.00–0.07)
Basophils Absolute: 0 10*3/uL (ref 0.0–0.1)
Basophils Relative: 0 %
Eosinophils Absolute: 0.1 10*3/uL (ref 0.0–0.5)
Eosinophils Relative: 2 %
HCT: 38.4 % (ref 36.0–46.0)
Hemoglobin: 13 g/dL (ref 12.0–15.0)
Immature Granulocytes: 0 %
Lymphocytes Relative: 22 %
Lymphs Abs: 1 10*3/uL (ref 0.7–4.0)
MCH: 31.8 pg (ref 26.0–34.0)
MCHC: 33.9 g/dL (ref 30.0–36.0)
MCV: 93.9 fL (ref 80.0–100.0)
Monocytes Absolute: 0.2 10*3/uL (ref 0.1–1.0)
Monocytes Relative: 4 %
Neutro Abs: 3.2 10*3/uL (ref 1.7–7.7)
Neutrophils Relative %: 72 %
Platelets: 84 10*3/uL — ABNORMAL LOW (ref 150–400)
RBC: 4.09 MIL/uL (ref 3.87–5.11)
RDW: 13.9 % (ref 11.5–15.5)
WBC: 4.4 10*3/uL (ref 4.0–10.5)
nRBC: 0 % (ref 0.0–0.2)

## 2020-12-12 LAB — IRON AND TIBC
Iron: 81 ug/dL (ref 41–142)
Saturation Ratios: 27 % (ref 21–57)
TIBC: 295 ug/dL (ref 236–444)
UIBC: 214 ug/dL (ref 120–384)

## 2020-12-12 LAB — FERRITIN: Ferritin: 168 ng/mL (ref 11–307)

## 2020-12-12 MED ORDER — IOHEXOL 9 MG/ML PO SOLN
ORAL | Status: AC
  Administered 2020-12-12: 500 mL
  Filled 2020-12-12: qty 1000

## 2020-12-12 MED ORDER — HEPARIN SOD (PORK) LOCK FLUSH 100 UNIT/ML IV SOLN
INTRAVENOUS | Status: AC
  Filled 2020-12-12: qty 5

## 2020-12-12 MED ORDER — IOHEXOL 9 MG/ML PO SOLN: 500.0000 mL | ORAL | Status: AC

## 2020-12-12 MED ORDER — HEPARIN SOD (PORK) LOCK FLUSH 100 UNIT/ML IV SOLN
500.0000 [IU] | Freq: Once | INTRAVENOUS | Status: AC
Start: 2020-12-12 — End: 2020-12-12
  Administered 2020-12-12: 500 [IU] via INTRAVENOUS

## 2020-12-12 MED ORDER — SODIUM CHLORIDE 0.9% FLUSH
10.0000 mL | Freq: Once | INTRAVENOUS | Status: AC
  Administered 2020-12-12: 10 mL

## 2020-12-12 MED ORDER — HEPARIN SOD (PORK) LOCK FLUSH 100 UNIT/ML IV SOLN: 500.0000 [IU] | Freq: Once | INTRAVENOUS | Status: DC

## 2020-12-12 MED ORDER — IOHEXOL 350 MG/ML SOLN
80.0000 mL | Freq: Once | INTRAVENOUS | Status: AC | PRN
  Administered 2020-12-12: 80 mL via INTRAVENOUS

## 2020-12-13 ENCOUNTER — Other Ambulatory Visit: Payer: Self-pay | Admitting: Hematology and Oncology

## 2020-12-13 LAB — CA 125: Cancer Antigen (CA) 125: 39.1 U/mL — ABNORMAL HIGH (ref 0.0–38.1)

## 2020-12-14 ENCOUNTER — Encounter: Payer: Self-pay | Admitting: Hematology and Oncology

## 2020-12-14 ENCOUNTER — Inpatient Hospital Stay (HOSPITAL_BASED_OUTPATIENT_CLINIC_OR_DEPARTMENT_OTHER): Payer: Medicare Other | Admitting: Hematology and Oncology

## 2020-12-14 ENCOUNTER — Other Ambulatory Visit: Payer: Self-pay

## 2020-12-14 DIAGNOSIS — R161 Splenomegaly, not elsewhere classified: Secondary | ICD-10-CM | POA: Diagnosis not present

## 2020-12-14 DIAGNOSIS — D696 Thrombocytopenia, unspecified: Secondary | ICD-10-CM | POA: Diagnosis not present

## 2020-12-14 DIAGNOSIS — M545 Low back pain, unspecified: Secondary | ICD-10-CM

## 2020-12-14 DIAGNOSIS — C482 Malignant neoplasm of peritoneum, unspecified: Secondary | ICD-10-CM

## 2020-12-14 DIAGNOSIS — G8929 Other chronic pain: Secondary | ICD-10-CM

## 2020-12-14 DIAGNOSIS — Z794 Long term (current) use of insulin: Secondary | ICD-10-CM

## 2020-12-14 DIAGNOSIS — Z9221 Personal history of antineoplastic chemotherapy: Secondary | ICD-10-CM | POA: Diagnosis not present

## 2020-12-14 DIAGNOSIS — K7581 Nonalcoholic steatohepatitis (NASH): Secondary | ICD-10-CM

## 2020-12-14 DIAGNOSIS — K7460 Unspecified cirrhosis of liver: Secondary | ICD-10-CM

## 2020-12-14 DIAGNOSIS — E1140 Type 2 diabetes mellitus with diabetic neuropathy, unspecified: Secondary | ICD-10-CM

## 2020-12-14 MED ORDER — HYDROCODONE-ACETAMINOPHEN 5-325 MG PO TABS: 1.0000 | ORAL_TABLET | Freq: Four times a day (QID) | ORAL | 0 refills | Status: DC | PRN

## 2020-12-14 NOTE — Assessment & Plan Note (Signed)
Her recent liver function test was within normal range CT imaging shows persistent liver cirrhosis and splenomegaly Observe closely

## 2020-12-14 NOTE — Progress Notes (Signed)
[place] OFFICE PROGRESS NOTE  Patient Care Team: Jolinda Croak, MD as PCP - General (Family Medicine) Tat, Eustace Quail, DO as Consulting Physician (Neurology)  ASSESSMENT & PLAN:  Primary peritoneal carcinomatosis [facility]) I have reviewed her blood work and imaging studies with the patient She has very slight gradual slow disease progression with peritoneal disease detectable on imaging study However, the patient is completely asymptomatic The pace of disease progression is very slow We discussed the risk and benefits of watchful observation with surveillance tumor marker monitoring and CT imaging and she is in agreement to proceed with active surveillance only I plan to see her back at the end of the year for repeat blood work, physical exam and tumor marker monitoring  Thrombocytopenia ([place]) 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 It is stable She has no recent bleeding Observe closely for now  Liver cirrhosis secondary to NASH [facility]) Her recent liver function test was within normal range CT imaging shows persistent liver cirrhosis and splenomegaly Observe closely  Diabetes mellitus with diabetic neuropathy, with long-term current use of insulin ([place]) She is on multiple different medications for diabetes We discussed importance of risk factor modification and aggressive management of diabetes in anticipation that she might need to resume chemotherapy in the future   Low back pain She has chronic back pain  I refilled her prescription of pain medicine today  No orders of the defined types were placed in this encounter.   All questions were answered. The patient knows to call the clinic with any problems, questions or concerns. The total time spent in the appointment was 30 minutes encounter with patients including review of chart and various tests results, discussions about plan of care and coordination of care plan   [name], MD 12/14/2020 3:58 PM  INTERVAL HISTORY: Please see below for problem oriented charting. she returns for surveillance follow-up for history of recurrent primary peritoneal carcinomatosis Since last time I saw her, she is doing well Her chronic back pain is stable She denies recent signs or symptoms of GI bleed No recent infection, fever or chills  REVIEW OF SYSTEMS:   Constitutional: Denies fevers, chills or abnormal weight loss Eyes: Denies blurriness of vision Ears, nose, mouth, throat, and face: Denies mucositis or sore throat Respiratory: Denies cough, dyspnea or wheezes Cardiovascular: Denies palpitation, chest discomfort or lower extremity swelling Gastrointestinal:  Denies nausea, heartburn or change in bowel habits Skin: Denies abnormal skin rashes Lymphatics: Denies new lymphadenopathy or easy bruising Neurological:Denies numbness, tingling or new weaknesses Behavioral/Psych: Mood is stable, no new changes  All other systems were reviewed with the patient and are negative.  I have reviewed the past medical history, past surgical history, social history and family history with the patient and they are unchanged from previous note.  ALLERGIES:  is allergic to carboplatin, shellfish allergy, vancomycin, benadryl [diphenhydramine hcl], and penicillins.  MEDICATIONS:  Current Outpatient Medications  Medication Sig Dispense Refill   calcium elemental as carbonate (BARIATRIC TUMS ULTRA) 400 MG chewable tablet Chew 1 tablet by mouth 2 (two) times daily.     Cholecalciferol (VITAMIN D) 50 MCG (2000 UT) tablet Take 2,000 Units by mouth at bedtime.     gabapentin (NEURONTIN) 300 MG capsule TAKE 3 CAPSULES(900 MG) BY MOUTH TWICE DAILY (Patient taking differently: 2 (two) times daily.) 120 capsule 11   HYDROcodone-acetaminophen (NORCO/VICODIN) 5-325 MG tablet Take 1 tablet by mouth every 6 (six) hours as needed  for moderate pain. 60 tablet 0   Insulin Degludec 100 UNIT/ML SOLN  Inject 30 Units into the skin daily.     lidocaine-prilocaine (EMLA) cream Apply to Porta-cath  1-2 hours prior to access as directed. (Patient taking differently: Apply 1 application topically daily as needed (port access).) 30 g 9   ondansetron (ZOFRAN) 8 MG tablet TAKE 1 TABLET(8 MG) BY MOUTH EVERY 8 HOURS AS NEEDED 60 tablet 1   pantoprazole (PROTONIX) 40 MG tablet Take 1 tablet (40 mg total) by mouth daily.     prochlorperazine (COMPAZINE) 10 MG tablet Take 1 tablet (10 mg total) by mouth every 6 (six) hours as needed (Nausea or vomiting). 30 tablet 1   rotigotine (NEUPRO) 4 MG/24HR APPLY 1 PATCH TOPICALLY TO THE SKIN DAILY 90 patch 3   Semaglutide,0.25 or 0.5MG/DOS, (OZEMPIC, 0.25 OR 0.5 MG/DOSE,) 2 MG/1.5ML SOPN Inject 0.5 mg into the skin once a week.     SEMAGLUTIDE,0.25 OR 0.5MG/DOS, [place] Inject 0.25 mg into the skin once a week.     No current facility-administered medications for this visit.    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 Had allergic reaction to carboplatin. Avastin was stopped due to GI hemorrhage   Primary peritoneal carcinomatosis ([place])  09/06/2011 Imaging   CT findings consistent with cirrhotic changes involving the liver.  No worrisome liver mass.  There are associated portal venous collaterals and splenomegaly consistent with portal venous hypertension. 2.  No other significant upper abdominal findings   11/08/2013 Imaging   US showed ascites   11/08/2013 Initial Diagnosis   Patient had ~ 2 months of some urinary incontinence, saw Dr Ronita Hipps in 10-2013, with pelvic mass on exam    11/12/2013 Imaging   There are findings of progressive hepatic cirrhosis with a large amount of ascites and mild splenomegaly. There are venous collaterals present. 2. There is abnormal thickening of the peritoneal surface along the left mid and lower abdominal wall. There is abnormal  soft tissue density in the pelvis as well which could reflect the clinically suspected ovarian malignancy but the findings are nondiagnostic. 3. There is a new small left pleural effusion. 4. There is no acute bowel abnormality.   11/18/2013 Imaging   Successful ultrasound guided diagnostic and therapeutic paracentesis yielding 4.1 liters of ascites.     11/18/2013 Pathology Results   PERITONEAL/ASCITIC FLUID (SPECIMEN 1 OF 1 COLLECTED 11/18/13): SEROUS CARCINOMA, PLEASE SEE COMMENT.   12/01/2013 Tumor Marker   Patient's tumor was tested for the following markers: CA125 Results of the tumor marker test revealed 1938   12/03/2013 Imaging   Successful ultrasound-guided therapeutic paracentesis yielding 3.6 liters of peritoneal fluid.   12/07/2013 - 05/30/2014 Chemotherapy   She received 3 cycles of carboplatin and Taxol, interrupted cycle 4 for surgery.  She subsequently completed 3 more cycles of chemotherapy after surgery   12/20/2013 Imaging   Successful ultrasound-guided diagnostic and therapeutic paracentesis yielding 2.2 liters of peritoneal fluid   12/20/2013 Pathology Results   PERITONEAL/ASCITIC FLUID(SPECIMEN 1 OF 1 COLLECTED 12/20/13): MALIGNANT CELLS CONSISTENT WITH SEROUS CARCINOMA   01/13/2014 Tumor Marker   Patient's tumor was tested for the following markers: CA125 Results of the tumor marker test revealed 227   02/07/2014 Tumor Marker   Patient's tumor was tested for the following markers: CA125 Results of the tumor marker test revealed 130   02/15/2014 Genetic Testing   Genetics testing from 02-2014 normal (  [employer] panel)   02/23/2014 Imaging   Interval decrease in ascites. 2. Morphologic changes in the liver consistent with cirrhosis. 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 Portal vein remains patent at this time. 3. Persistent but decreased abnormal soft tissue attenuation tracking in the omental fat. This may be secondary to interval  improvement in metastatic disease. 4. Peritoneal thickening seen in the left abdomen and pelvis on the previous study has decreased and nearly resolved in the interval. This also suggests interval improvement in metastatic disease.     03/07/2014 Procedure   Ultrasound and fluoroscopically guided right internal jugular single lumen power port catheter insertion. Tip in the SVC/RA junction. Catheter ready for use.   03/17/2014 Tumor Marker   Patient's tumor was tested for the following markers: CA125 Results of the tumor marker test revealed 45   03/29/2014 Pathology Results   1. Omentum, resection for tumor - HIGH GRADE SEROUS CARCINOMA, SEE COMMENT. 2. Ovary and fallopian tube, right - HIGH GRADE SEROUS CARCINOMA, SEE COMMENT. 3. Ovary and fallopian tube, left - HIGH GRADE SEROUS CARCINOMA, SEE COMMENT. Diagnosis Note 1. Nests and clusters of malignant cells are invading the omental tissue (part #1) with associated fibrosis. The cells are pleomorphic with prominent nucleoli. There are scattered psammoma bodies. The ovaries are atrophic and exhibit multiple foci of surface based invasive carcinoma and associated fibrosis. The fallopian tubes have a few foci of carcinoma, also superficially located. There are no precursor lesions noted in the ovaries or fallopian tubes (the entire tubes and ovaries were submitted for evaluation). While there is some retraction artifact, there are several foci suspicious for lymphovascular invasion. 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 Given the fibrosis, there does appear to be a small amount of treatment effect, however, there is abundant residual tumor.    03/29/2014 Surgery   Procedure(s) Performed: 1. Exploratory laparotomy with bilateral salpingo-oophorectomy, omentectomy radical tumor debulking for ovarian cancer .    Surgeon: Thereasa Solo, MD.   Assistant Surgeon: Lahoma Crocker, M.D. Assistant: (an MD assistant was necessary for tissue manipulation, retraction and positioning due to the complexity of the case and hospital policies).  Operative Findings: 10cm omental cake from hepatic to splenic flexure, densely adherent to transverse colon. Milliary studding of tumor implants (<27m, too numerous in number to count) adherent to the mesentery of the small bowel and small bowel wall. Small volume (100cc) ascites. Small ovaries bilaterally, densely adherent to the pelvic cul de sac. Left ovary and tube densely adherent to sigmoid colon. Cirrhotic liver with hepatomegaly and splenomegaly.     This represented an optimal cytoreduction (R1) with visible disease residual on the bowel wall and mesentery (millial, <339mimplants).      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 2. 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     08/18/2014 Tumor Marker   Patient's tumor was  tested for the following markers: CA125 Results of the tumor marker test revealed 16   10/13/2014 Tumor Marker   Patient's tumor was tested for the following markers: CA125 Results of the tumor marker test revealed 14   11/21/2014 Tumor Marker   Patient's tumor was tested for the following markers: CA125 Results of the tumor marker test revealed 16   12/28/2014 Tumor Marker   Patient's tumor was tested for the following markers: CA125 Results of the tumor  marker test revealed 762   02/27/2015 Tumor Marker   Patient's tumor was tested for the following markers: CA125 Results of the tumor marker test revealed 20.2   03/22/2015 Imaging   Filler appearance to the prior exam, with very subtle fluid tracking along portions of the liver, and very minimal nodularity along the right paracolic gutter representing residua from the prior peritoneal cancer. The current abnormalities could simply be post therapy findings rather than necessarily representing residual malignancy. No new or enlarging lesions are identified. 2. Hepatic cirrhosis and splenomegaly. There is some gastric varices suggesting portal venous hypertension. 3. Left foraminal stenosis at L4-5 due to spurring. There is likely also some central narrowing of the thecal sac at this level. 4. Bibasilar scarring. 5. Chronic mild left mid kidney scarring   03/27/2015 Tumor Marker   Patient's tumor was tested for the following markers: CA125 Results of the tumor marker test revealed 25.3   04/24/2015 Imaging   Disc bulge L2-3 with mild spinal stenosis and right foraminal encroachment. 2. Disc bulge L3-4 with mild spinal stenosis. 3. Multifactorial spinal, left lateral recess and foraminal stenosis L4-5. There is grade 1 anterolisthesis without evident dynamic instability.   05/29/2015 Tumor Marker   Patient's tumor was tested for the following markers: CA125 Results of the tumor marker test revealed 29.9   08/21/2015 Tumor Marker   Patient's tumor was tested for the following markers: CA125 Results of the tumor marker test revealed 45   09/18/2015 Tumor Marker   Patient's tumor was tested for the following markers: CA125 Results of the tumor marker test revealed 47   09/27/2015 Imaging   New small bowel mesenteric nodule and enlarged left external iliac lymph node, highly worrisome for metastatic disease. 2. Cirrhosis and splenomegaly   10/10/2015 Tumor Marker   Patient's tumor was tested for the  following markers: CA125 Results of the tumor marker test revealed 53.4   10/10/2015 - 12/11/2015 Chemotherapy   She received 4 cycles of carboplatin only   11/20/2015 Tumor Marker   Patient's tumor was tested for the following markers: CA125 Results of the tumor marker test revealed 41.3   12/20/2015 Imaging   CT: Mild left external iliac lymphadenopathy is slightly decreased. Mildly enlarged lower mesenteric nodule is slightly decreased. 2. No new or progressive metastatic disease in the abdomen or pelvis. 3. Cirrhosis. Stable subcentimeter hypodense left liver lobe lesion. No new liver lesions. 4. Stable mild splenomegaly.  No ascites. 5. Mild sigmoid diverticulosis.   01/01/2016 -  Anti-estrogen oral therapy   She was placed on tamoxifen. Had letrozole initially but switched to tamoxifen due to poor tolerance   01/15/2016 Tumor Marker   Patient's tumor was tested for the following markers: CA125 Results of the tumor marker test revealed 31.4   02/19/2016 Tumor Marker   Patient's tumor was tested for the following markers: CA125 Results of the tumor marker test revealed 36.5   05/13/2016 Imaging   No evidence of disease progression within the abdomen or pelvis. Previously noted  small central mesenteric nodule and left external iliac node appear slightly smaller. 2. Stable changes of hepatic cirrhosis and portal hypertension with associated splenomegaly. No new or enlarging hepatic lesions are identified. 3. No acute findings.   05/13/2016 Tumor Marker   Patient's tumor was tested for the following markers: CA125 Results of the tumor marker test revealed 41.2   06/24/2016 Tumor Marker   Patient's tumor was tested for the following markers: CA125 Results of the tumor marker test revealed 47.3   08/20/2016 Imaging   1. The left external iliac lymph node is slightly increased in size compared to the prior exam, currently 1.1 cm and previously 0.9 cm in diameter. 2. Stable central mesenteric  lymph node at 0.9 cm in diameter. 3. Stable trace free pelvic fluid and stable slight thickening along the right paracolic gutter, without well-defined peritoneal nodularity. 4. Other imaging findings of potential clinical significance: Subsegmental atelectasis or scarring in the lung bases. Hepatic cirrhosis. Left renal scarring. Mild splenomegaly. Sigmoid colon diverticulosis. Impingement at L4-5 due to spondylosis and degenerative disc disease broad Schmorl' s nodes at L3-L4. Pelvic floor laxity   08/23/2016 Tumor Marker   Patient's tumor was tested for the following markers: CA125 Results of the tumor marker test revealed 80.2   09/04/2016 Imaging   LV EF: 60% -   65%   09/12/2016 Tumor Marker   Patient's tumor was tested for the following markers: CA125 Results of the tumor marker test revealed 98.5   09/12/2016 - 01/29/2018 Chemotherapy   The patient received Doxil and Avastin. Avastin was discontinued in Dec 2019 due to GI hemorrhage   09/26/2016 Tumor Marker   Patient's tumor was tested for the following markers: CA125 Results of the tumor marker test revealed 68.9   10/10/2016 Tumor Marker   Patient's tumor was tested for the following markers: CA125 Results of the tumor marker test revealed 65.6   11/07/2016 Tumor Marker   Patient's tumor was tested for the following markers: CA125 Results of the tumor marker test revealed 46.4   11/29/2016 Imaging   Stable mild peritoneal thickening, suspicious for peritoneal carcinomatosis. No new or progressive disease identified. No evidence of ascites.  No significant change in 10 mm left external iliac and 8 mm mesenteric lymph nodes.  Stable hepatic cirrhosis, and splenomegaly consistent with portal venous hypertension.  Colonic diverticulosis, without radiographic evidence of diverticulitis.   12/02/2016 Imaging   Normal LV size with EF 55%. Basal inferior and basal inferoseptal hypokinesis. Normal RV size and systolic function. No  significant valvular abnormalities.   12/05/2016 Tumor Marker   Patient's tumor was tested for the following markers: CA125 Results of the tumor marker test revealed 49.1   01/09/2017 Tumor Marker   Patient's tumor was tested for the following markers: CA125 Results of the tumor marker test revealed 47.2   02/06/2017 Tumor Marker   Patient's tumor was tested for the following markers: CA125 Results of the tumor marker test revealed 44.1   02/18/2017 Imaging   Stable mild peritoneal thickening. No new or progressive disease identified within the abdomen or pelvis.  Stable tiny sub-cm left external iliac and mesenteric lymph nodes.  Stable hepatic cirrhosis, and splenomegaly consistent with portal venous hypertension. No evidence of hepatic neoplasm.  Colonic diverticulosis, without radiographic evidence of diverticulitis.   03/06/2017 Tumor Marker   Patient's tumor was tested for the following markers: CA125 Results of the tumor marker test revealed 50.4   03/17/2017 Imaging   - Left ventricle: The cavity  size was normal. Wall thickness was normal. Systolic function was normal. The estimated ejection fraction was in the range of 55% to 60%. Wall motion was normal; there were no regional wall motion abnormalities. Features are consistent with a pseudonormal left ventricular filling pattern, with concomitant abnormal relaxation and increased filling pressure (grade 2 diastolic dysfunction). - Mitral valve: There was mild regurgitation. - Left atrium: The atrium was mildly dilated   04/03/2017 Tumor Marker   Patient's tumor was tested for the following markers: CA125 Results of the tumor marker test revealed 47.2   05/14/2017 Imaging   CT scan of abdomen and pelvis Stable mild peritoneal thickening. No new or progressive disease within the abdomen or pelvis.  Stable hepatic cirrhosis. Stable splenomegaly, consistent with portal venous hypertension. No evidence of hepatic neoplasm.    05/15/2017 Tumor Marker   Patient's tumor was tested for the following markers: CA125 Results of the tumor marker test revealed 56.1   06/12/2017 Tumor Marker   Patient's tumor was tested for the following markers: CA125 Results of the tumor marker test revealed 49.2   07/10/2017 Tumor Marker   Patient's tumor was tested for the following markers: CA125 Results of the tumor marker test revealed 46.3   08/07/2017 Tumor Marker   Patient's tumor was tested for the following markers: CA125 Results of the tumor marker test revealed 52.9   08/20/2017 Imaging   1. Mild omental/peritoneal haziness, unchanged. No evidence of new metastatic disease. 2. Cirrhosis with splenomegaly.   09/04/2017 Tumor Marker   Patient's tumor was tested for the following markers: CA125 Results of the tumor marker test revealed 48.5   11/13/2017 Tumor Marker   Patient's tumor was tested for the following markers: CA125 Results of the tumor marker test revealed 55.8   12/17/2017 Imaging   1. No findings to suggest metastatic disease in the abdomen or pelvis. 2. Severe hepatic cirrhosis with evidence of portal hypertension, as demonstrated by dilated portal vein, splenomegaly and portosystemic collateral pathways, including gastric and esophageal varices.  3. Colonic diverticulosis without evidence of acute diverticulitis at this time. 4. Additional incidental findings, as above.   01/16/2018 Tumor Marker   Patient's tumor was tested for the following markers: CA125 Results of the tumor marker test revealed 63.7   02/10/2018 Procedure   EGD - Recently bleeding grade III and large (> 5 mm) esophageal varices. Completely eradicated. Banded. - Portal hypertensive gastropathy. - Normal examined duodenum. - No specimens collected   02/13/2018 Tumor Marker   Patient's tumor was tested for the following markers: CA125 Results of the tumor marker test revealed 82.7   02/25/2018 Imaging   Bone density showed mild  osteopenia   04/08/2018 Tumor Marker   Patient's tumor was tested for the following markers: CA125 Results of the tumor marker test revealed 82.7   04/08/2018 Imaging   1. No definite omental or peritoneal surface lesions. However, there are 2 slowly enlarging lymph nodes noted. One is in the small bowel mesentery and the other is in the left deep pelvis. Could not exclude recurrent disease. 2. Stable advanced cirrhotic changes involving the liver with portal venous hypertension, portal venous collaterals, esophageal varices and splenomegaly. No worrisome hepatic lesions. 3. Diffuse wall thickening of the colon could suggest diffuse colitis or could be due to low albumin. Recommend correlation with any symptoms such as diarrhea.   05/21/2018 Tumor Marker   Patient's tumor was tested for the following markers: CA125 Results of the tumor marker test revealed 86  09/07/2018 Tumor Marker   Patient's tumor was tested for the following markers:CA-125 Results of the tumor marker test revealed 111   09/29/2018 Tumor Marker   Patient's tumor was tested for the following markers: CA-125 Results of the tumor marker test revealed 121.   09/29/2018 - 11/24/2018 Chemotherapy   The patient had carboplatin and gemzar for chemotherapy treatment.  Chemo was stopped due to allergic reaction to carboplatin   10/27/2018 Tumor Marker   Patient's tumor was tested for the following markers: CA-125 Results of the tumor marker test revealed 99.9   11/24/2018 Tumor Marker   Patient's tumor was tested for the following markers: CA-125 Results of the tumor marker test revealed 81.2   12/14/2018 Tumor Marker   Patient's tumor was tested for the following markers: CA-125 Results of the tumor marker test revealed 81.2   12/14/2018 Imaging   1. Mild improvement in peritoneal disease. Peritoneal nodule within the left posterior pelvis contiguous with the left side of vaginal cuff is slightly decreased in size in the  interval. Within the right iliac fossa there is a small peritoneal nodule which has decreased in size in the interval. Central small bowel mesenteric lymph node is not significantly changed. Slight decrease in size left periaortic lymph node.    2. Morphologic features of the liver compatible with cirrhosis. Splenomegaly is noted which may reflect portal venous hypertension.   12/21/2018 Tumor Marker   Patient's tumor was tested for the following markers: CA-125 Results of the tumor marker test revealed 64.2   12/21/2018 -  Chemotherapy   The patient had taxotere for chemotherapy treatment.     03/02/2019 Tumor Marker   Patient's tumor was tested for the following markers: CA-125. 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   No new or progressive metastatic disease within the abdomen or pelvis.   Hepatic cirrhosis and findings of portal venous hypertension. 1.1 cm low-attenuation lesion within left hepatic lobe, which could represent a regenerative nodule or small hepatocellular carcinoma. Recommend abdomen MRI without and with contrast for further characterization.     04/13/2019 Tumor Marker   Patient's tumor was tested for the following markers: CA-125 Results of the tumor marker test revealed 36.8   05/26/2019 Tumor Marker   Patient's tumor was tested for the following markers: CA-125 Results of the tumor marker test revealed 31.6.   06/15/2019 Tumor Marker   Patient's tumor was tested for the following markers: CA-125 Results of the tumor marker test revealed 25.4   07/05/2019 Imaging   1. Stable CT examination. Stable peritoneal thickening in the pelvis and paracolic gutters. Stable solid left deep pelvic implant. Stable trace ascites. No new or progressive metastatic disease. 2. Stable cirrhosis. No discrete liver masses. 3. Stable mild-to-moderate splenomegaly. 4. Aortic  Atherosclerosis (ICD10-I70.0).   07/27/2019 Tumor Marker   Patient's tumor was tested for the following markers: CA-125 Results of the tumor marker test revealed 35.4   08/17/2019 Tumor Marker   Patient's tumor was tested for the following markers: Ca-125 Results of the tumor marker test revealed 35.7.   09/17/2019 - 09/21/2019 Hospital Admission   She was admitted due to esophageal variceal bleeding presents with hematemesis and melanotic stool with epigastric pain.  GI consulted, started on octreotide infusion.  Underwent EGD which showed minimal bleeding, remain on octreotide drip, PPI drip.  Hemoglobin trended down therefore received PRBC transfusion.  Over several days her  bleeding subsided, hemodynamically remained stable.    09/17/2019 Procedure   EGD report - Esophageal mucosal scarring, from prior esophageal banding sessions. - Grade I esophageal varices. - Red blood in the cardia and in the gastric fundus. - Portal hypertensive gastropathy. - Normal duodenal bulb, first portion of the duodenum and second portion of the duodenum. 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 2. Otherwise stable CT exam. Stable peritoneal thickening within the bilateral deep pelvic peritoneum. Stable left deep pelvic implant.No evidence for progressed disease. 3. Stable splenomegaly. 4. Morphologic changes to the liver compatible with cirrhosis. Trace perihepatic and pelvic ascites.   02/22/2020 Imaging   1. New lucent lesion in the left side of the L4 vertebral body measuring 1 cm, concerning for possible osseous  metastasis. Consideration for further evaluation with bone scan is suggested. Close attention on follow-up imaging is also recommended to ensure stability. 2. Previously noted peritoneal thickening and nodularity in the low anatomic pelvis has regressed slightly. 3. Borderline enlarged and mildly enlarged pelvic and retroperitoneal lymph nodes appear similar to slightly increased compared to the prior examination, as above. Continued attention on follow-up studies is recommended.  4. 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 5. Aortic atherosclerosis. 6. Additional incidental findings, as above.   02/22/2020 Tumor Marker   Patient's tumor was tested for the following markers: CA-125 Results of the tumor marker test revealed 30.1   04/06/2020 Tumor Marker   Patient's tumor was tested for the following markers: CA-125 Results of the tumor marker test revealed 35.7   09/11/2020 Tumor Marker   Patient's tumor was tested for the following markers: CA-125. Results of the tumor marker test revealed 26.6.   09/12/2020 Imaging   1. Slow enlargement of a currently 2.9 cubic cm soft tissue nodule along the left adnexa. Tumor deposit not excluded. 2. The left external iliac lymph node has substantially reduced in size, currently 0.9 cm in short axis and formerly 1.4 cm. 3. 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 4. Cirrhosis with portal venous hypertension. 5. Mild distal esophageal wall thickening, probably from esophagitis. Splenomegaly. 6. Scarring in the left kidney. 7. Sigmoid colon diverticulosis. 8. Pelvic floor laxity with small cystocele. 9. Lumbar impingement at L2-3, L3-4, and L4-5.     10/23/2020 Tumor Marker   Patient's tumor was tested for the following markers: CA-125. Results of the tumor marker test  revealed 35.3.   12/12/2020 Tumor Marker   Patient's tumor was tested for the following markers: CA-125. Results of the tumor marker test revealed 39.1.   12/13/2020 Imaging   1. Continued increase in size of soft tissue nodule within the left posterior pelvis adjacent to the sigmoid colon. Cannot exclude small tumor deposit. 2. Similar appearance of thickening and mild nodularity along the peritoneal reflections of the pelvis and pericolic gutters. 3. Morphologic findings of the liver compatible with cirrhosis. 4. Splenomegaly. 5. Aortic Atherosclerosis (ICD10-I70.0).     PHYSICAL EXAMINATION: ECOG PERFORMANCE STATUS: 1 - Symptomatic but completely ambulatory  Vitals:   12/14/20 1121  BP: Marland Kitchen)  145/54  Pulse: 65  Resp: 18  SpO2: 100%   Filed Weights   12/14/20 1121  Weight: 185 lb (83.9 kg)    GENERAL:alert, no distress and comfortable NEURO: alert & oriented x 3 with fluent speech, no focal motor/sensory deficits  LABORATORY DATA:  I have reviewed the data as listed    Component Value Date/Time   NA 141 12/12/2020 0830   NA 142 02/06/2017 0742   K 4.4 12/12/2020 0830   K 3.9 02/06/2017 0742   CL 106 12/12/2020 0830   CO2 27 12/12/2020 0830   CO2 22 02/06/2017 0742   GLUCOSE 92 12/12/2020 0830   GLUCOSE 156 (H) 02/06/2017 0742   BUN 16 12/12/2020 0830   BUN 13.5 02/06/2017 0742   CREATININE 1.14 (H) 12/12/2020 0830   CREATININE 0.85 10/26/2019 0722   CREATININE 0.8 02/06/2017 0742   CALCIUM 8.9 12/12/2020 0830   CALCIUM 8.5 02/06/2017 0742   PROT 7.4 12/12/2020 0830   PROT 6.9 02/06/2017 0742   ALBUMIN 3.8 12/12/2020 0830   ALBUMIN 3.7 02/06/2017 0742   AST 22 12/12/2020 0830   AST 24 10/26/2019 0722   AST 47 (H) 02/06/2017 0742   ALT 26 12/12/2020 0830   ALT 17 10/26/2019 0722   ALT 54 02/06/2017 0742   ALKPHOS 84 12/12/2020 0830   ALKPHOS 130 02/06/2017 0742   BILITOT 0.8 12/12/2020 0830   BILITOT 0.7 10/26/2019 0722   BILITOT 0.65 02/06/2017 0742    GFRNONAA 51 (L) 12/12/2020 0830   GFRNONAA >60 10/26/2019 0722   GFRAA >60 11/09/2019 0953   GFRAA >60 10/26/2019 0722    No results found for: SPEP, UPEP  Lab Results  Component Value Date   WBC 4.4 12/12/2020   NEUTROABS 3.2 12/12/2020   HGB 13.0 12/12/2020   HCT 38.4 12/12/2020   MCV 93.9 12/12/2020   PLT 84 (L) 12/12/2020      Chemistry      Component Value Date/Time   NA 141 12/12/2020 0830   NA 142 02/06/2017 0742   K 4.4 12/12/2020 0830   K 3.9 02/06/2017 0742   CL 106 12/12/2020 0830   CO2 27 12/12/2020 0830   CO2 22 02/06/2017 0742   BUN 16 12/12/2020 0830   BUN 13.5 02/06/2017 0742   CREATININE 1.14 (H) 12/12/2020 0830   CREATININE 0.85 10/26/2019 0722   CREATININE 0.8 02/06/2017 0742      Component Value Date/Time   CALCIUM 8.9 12/12/2020 0830   CALCIUM 8.5 02/06/2017 0742   ALKPHOS 84 12/12/2020 0830   ALKPHOS 130 02/06/2017 0742   AST 22 12/12/2020 0830   AST 24 10/26/2019 0722   AST 47 (H) 02/06/2017 0742   ALT 26 12/12/2020 0830   ALT 17 10/26/2019 0722   ALT 54 02/06/2017 0742   BILITOT 0.8 12/12/2020 0830   BILITOT 0.7 10/26/2019 0722   BILITOT 0.65 02/06/2017 0742       RADIOGRAPHIC STUDIES: I have reviewed multiple imaging studies with the patient I have personally reviewed the radiological images as listed and agreed with the findings in the report. CT ABDOMEN PELVIS W CONTRAST  Result Date: 12/13/2020 CLINICAL DATA:  Ovarian cancer restaging. EXAM: CT ABDOMEN AND PELVIS WITH CONTRAST TECHNIQUE: Multidetector CT imaging of the abdomen and pelvis was performed using the standard protocol following bolus administration of intravenous contrast. CONTRAST:  8m OMNIPAQUE IOHEXOL 350 MG/ML SOLN COMPARISON:  09/11/2020 FINDINGS: Lower chest: Scarring noted within the visualized portions of both upper lobes and  lower lobes. No pleural effusion or airspace consolidation. Hepatobiliary: Diffuse nodular contour the liver is identified with hypertrophy  of the caudate lobe and lateral segment of left hepatic lobe. Imaging findings are compatible with cirrhosis. No focal liver lesion identified. Gallbladder unremarkable. No bile duct dilatation. Pancreas: Unremarkable. No pancreatic ductal dilatation or surrounding inflammatory changes. Spleen: Splenomegaly. Spleen measures 14.2 cm cranial caudal. This is compared with 14.6 cm previously. Adrenals/Urinary Tract: Normal adrenal glands. No nephrolithiasis, hydronephrosis or mass identified bilaterally. Urinary bladder is unremarkable. Stomach/Bowel: Stomach appears normal. No bowel wall thickening, inflammation or distension. Vascular/Lymphatic: Mild aortic atherosclerosis. No aneurysm. No abdominal adenopathy. Index left external iliac node measures 5 mm, image 73/2. Previously 9 mm. Reproductive: Status post hysterectomy. Other: Soft tissue nodule within the left posterior pelvis adjacent to the sigmoid colon measures 2.4 x 1.7 cm by 1.9 (volume = 4.1 cm^3), image 78/2. Formally 2.3 x 1.5 by 1.6 cm (volume = 2.9 cm^3). On 05/22/2020 this measured 2.3 by 1.2 by 1.5 cm (volume = 2.2 cm^3). Similar thickness of mild thickening and nodularity along the peritoneal reflections within the pericolic gutters. There is mild thickening and nodularity along the peritoneal reflections of the pelvis. Also similar to previous exam. No ascites or discrete fluid collection. Postsurgical changes within the ventral abdominal wall again noted with small periumbilical hernia containing fat only, similar. Musculoskeletal: No acute or significant osseous findings. Degenerative disc disease noted within the lumbar spine. Most advanced at L4-5. IMPRESSION: 1. Continued increase in size of soft tissue nodule within the left posterior pelvis adjacent to the sigmoid colon. Cannot exclude small tumor deposit. 2. Similar appearance of thickening and mild nodularity along the peritoneal reflections of the pelvis and pericolic gutters. 3.  Morphologic findings of the liver compatible with cirrhosis. 4. Splenomegaly. 5. Aortic Atherosclerosis (ICD10-I70.0). Electronically Signed   By: Kerby Moors M.D.   On: 12/13/2020 09:42

## 2020-12-14 NOTE — Assessment & Plan Note (Signed)
I have reviewed her blood work and imaging studies with the patient She has very slight gradual slow disease progression with peritoneal disease detectable on imaging study However, the patient is completely asymptomatic The pace of disease progression is very slow We discussed the risk and benefits of watchful observation with surveillance tumor marker monitoring and CT imaging and she is in agreement to proceed with active surveillance only I plan to see her back at the end of the year for repeat blood work, physical exam and tumor marker monitoring

## 2020-12-14 NOTE — Assessment & Plan Note (Signed)
She is on multiple different medications for diabetes We discussed importance of risk factor modification and aggressive management of diabetes in anticipation that she might need to resume chemotherapy in the future

## 2020-12-14 NOTE — Assessment & Plan Note (Signed)
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 It is stable She has no recent bleeding Observe closely for now

## 2020-12-14 NOTE — Assessment & Plan Note (Signed)
She has chronic back pain  I refilled her prescription of pain medicine today

## 2021-01-24 ENCOUNTER — Other Ambulatory Visit: Payer: Self-pay

## 2021-01-24 ENCOUNTER — Inpatient Hospital Stay: Payer: Medicare Other | Attending: Hematology and Oncology

## 2021-01-24 DIAGNOSIS — Z5111 Encounter for antineoplastic chemotherapy: Secondary | ICD-10-CM

## 2021-01-24 DIAGNOSIS — D696 Thrombocytopenia, unspecified: Secondary | ICD-10-CM | POA: Diagnosis not present

## 2021-01-24 DIAGNOSIS — C482 Malignant neoplasm of peritoneum, unspecified: Secondary | ICD-10-CM | POA: Diagnosis present

## 2021-01-24 DIAGNOSIS — D509 Iron deficiency anemia, unspecified: Secondary | ICD-10-CM

## 2021-01-24 LAB — COMPREHENSIVE METABOLIC PANEL
ALT: 22 U/L (ref 0–44)
AST: 22 U/L (ref 15–41)
Albumin: 3.8 g/dL (ref 3.5–5.0)
Alkaline Phosphatase: 97 U/L (ref 38–126)
Anion gap: 9 (ref 5–15)
BUN: 17 mg/dL (ref 8–23)
CO2: 25 mmol/L (ref 22–32)
Calcium: 8.6 mg/dL — ABNORMAL LOW (ref 8.9–10.3)
Chloride: 108 mmol/L (ref 98–111)
Creatinine, Ser: 0.91 mg/dL (ref 0.44–1.00)
GFR, Estimated: >60 mL/min (ref >60)
Glucose, Bld: 93 mg/dL (ref 70–99)
Potassium: 4.3 mmol/L (ref 3.5–5.1)
Sodium: 142 mmol/L (ref 135–145)
Total Bilirubin: 0.7 mg/dL (ref 0.3–1.2)
Total Protein: 7.5 g/dL (ref 6.5–8.1)

## 2021-01-24 LAB — CBC WITH DIFFERENTIAL/PLATELET
Abs Immature Granulocytes: 0.02 10*3/uL (ref 0.00–0.07)
Basophils Absolute: 0 10*3/uL (ref 0.0–0.1)
Basophils Relative: 0 %
Eosinophils Absolute: 0.1 10*3/uL (ref 0.0–0.5)
Eosinophils Relative: 2 %
HCT: 38.2 % (ref 36.0–46.0)
Hemoglobin: 13 g/dL (ref 12.0–15.0)
Immature Granulocytes: 0 %
Lymphocytes Relative: 19 %
Lymphs Abs: 1 10*3/uL (ref 0.7–4.0)
MCH: 31.7 pg (ref 26.0–34.0)
MCHC: 34 g/dL (ref 30.0–36.0)
MCV: 93.2 fL (ref 80.0–100.0)
Monocytes Absolute: 0.3 10*3/uL (ref 0.1–1.0)
Monocytes Relative: 5 %
Neutro Abs: 3.7 10*3/uL (ref 1.7–7.7)
Neutrophils Relative %: 74 %
Platelets: 90 10*3/uL — ABNORMAL LOW (ref 150–400)
RBC: 4.1 MIL/uL (ref 3.87–5.11)
RDW: 13.7 % (ref 11.5–15.5)
WBC: 5.1 10*3/uL (ref 4.0–10.5)
nRBC: 0 % (ref 0.0–0.2)

## 2021-01-24 LAB — IRON AND TIBC
Iron: 64 ug/dL (ref 41–142)
Saturation Ratios: 21 % (ref 21–57)
TIBC: 306 ug/dL (ref 236–444)
UIBC: 242 ug/dL (ref 120–384)

## 2021-01-24 LAB — FERRITIN: Ferritin: 141 ng/mL (ref 11–307)

## 2021-01-24 MED ORDER — HEPARIN SOD (PORK) LOCK FLUSH 100 UNIT/ML IV SOLN
500.0000 [IU] | Freq: Once | INTRAVENOUS | Status: AC
  Administered 2021-01-24: 08:00:00 500 [IU]

## 2021-01-24 MED ORDER — SODIUM CHLORIDE 0.9% FLUSH
10.0000 mL | Freq: Once | INTRAVENOUS | Status: AC
  Administered 2021-01-24: 08:00:00 10 mL

## 2021-01-25 ENCOUNTER — Telehealth (HOSPITAL_BASED_OUTPATIENT_CLINIC_OR_DEPARTMENT_OTHER): Payer: Medicare Other | Admitting: Hematology and Oncology

## 2021-01-25 ENCOUNTER — Encounter: Payer: Self-pay | Admitting: Hematology and Oncology

## 2021-01-25 DIAGNOSIS — D696 Thrombocytopenia, unspecified: Secondary | ICD-10-CM | POA: Diagnosis not present

## 2021-01-25 DIAGNOSIS — G8929 Other chronic pain: Secondary | ICD-10-CM | POA: Diagnosis not present

## 2021-01-25 DIAGNOSIS — C482 Malignant neoplasm of peritoneum, unspecified: Secondary | ICD-10-CM | POA: Diagnosis not present

## 2021-01-25 DIAGNOSIS — M549 Dorsalgia, unspecified: Secondary | ICD-10-CM | POA: Diagnosis not present

## 2021-01-25 LAB — CA 125: Cancer Antigen (CA) 125: 56.2 U/mL — ABNORMAL HIGH (ref 0.0–38.1)

## 2021-01-25 MED ORDER — HYDROCODONE-ACETAMINOPHEN 5-325 MG PO TABS: 1.0000 | ORAL_TABLET | Freq: Four times a day (QID) | ORAL | 0 refills | Status: DC | PRN

## 2021-01-25 NOTE — Assessment & Plan Note (Signed)
She has stable chronic back pain I refilled her prescription hydrocodone

## 2021-01-25 NOTE — Assessment & Plan Note (Signed)
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 It is stable She has no recent bleeding Observe closely for now

## 2021-01-25 NOTE — Assessment & Plan Note (Signed)
I have reviewed her blood work with the patient She has very slight gradual slow disease progression with gradual increase in CA125 value and detectable peritoneal disease on imaging study However, the patient is completely asymptomatic The pace of disease progression is very slow I suspect she will likely need treatment after the holidays I recommend CT imaging to be done after Christmas and I will see her first week of the year to review test results

## 2021-01-25 NOTE — Progress Notes (Signed)
HEMATOLOGY-ONCOLOGY ELECTRONIC VISIT PROGRESS NOTE  Patient Care Team: Jolinda Croak, MD as PCP - General (Family Medicine) Tat, Eustace Quail, DO as Consulting Physician (Neurology)  I connected with the patient via telephone conference and verified that I am speaking with the correct person using two identifiers. The patient's location is at home and I am providing care from the [facility] I discussed the limitations, risks, security and privacy concerns of performing an evaluation and management service by e-visits and the availability of in person appointments.  I also discussed with the patient that there may be a patient responsible charge related to this service. The patient expressed understanding and agreed to proceed.   ASSESSMENT & PLAN:  Primary peritoneal carcinomatosis ([place]) I have reviewed her blood work with the patient She has very slight gradual slow disease progression with gradual increase in CA125 value and detectable peritoneal disease on imaging study However, the patient is completely asymptomatic The pace of disease progression is very slow I suspect she will likely need treatment after the holidays I recommend CT imaging to be done after Christmas and I will see her first week of the year to review test results  Chronic back pain greater than 3 months duration She has stable chronic back pain I refilled her prescription hydrocodone  Thrombocytopenia ([place]) 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     Answer:   [facility]    Order Specific Question:   Radiology Contrast Protocol - do NOT remove file path    Answer:   [email or web address]    INTERVAL HISTORY: Please see below for problem oriented charting. 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 Had allergic reaction to carboplatin. Avastin was stopped due to GI hemorrhage   Primary peritoneal carcinomatosis ([place])  09/06/2011 Imaging   CT findings consistent with cirrhotic changes involving the liver.  No worrisome liver mass.  There are associated portal venous collaterals and splenomegaly consistent with portal venous hypertension. 2.  No other significant upper abdominal findings   11/08/2013 Imaging   US showed ascites   11/08/2013 Initial Diagnosis   Patient had ~ 2 months of some urinary incontinence, saw Dr Ronita Hipps in 10-2013, with pelvic mass on exam    11/12/2013 Imaging   There are findings of progressive hepatic cirrhosis with a large amount of ascites and mild splenomegaly. There are venous collaterals present. 2. There is abnormal thickening of the peritoneal surface along the left mid and lower abdominal wall. There is abnormal soft tissue density in the pelvis as well which could reflect the clinically suspected ovarian malignancy but the findings are nondiagnostic. 3. There is a new small left pleural effusion. 4.  There is no acute bowel abnormality.   11/18/2013 Imaging   Successful ultrasound guided diagnostic and therapeutic paracentesis yielding 4.1 liters of ascites.     11/18/2013 Pathology Results   PERITONEAL/ASCITIC FLUID (SPECIMEN 1 OF 1  COLLECTED 11/18/13): SEROUS CARCINOMA, PLEASE SEE COMMENT.   12/01/2013 Tumor Marker   Patient's tumor was tested for the following markers: CA125 Results of the tumor marker test revealed 1938   12/03/2013 Imaging   Successful ultrasound-guided therapeutic paracentesis yielding 3.6 liters of peritoneal fluid.   12/07/2013 - 05/30/2014 Chemotherapy   She received 3 cycles of carboplatin and Taxol, interrupted cycle 4 for surgery.  She subsequently completed 3 more cycles of chemotherapy after surgery   12/20/2013 Imaging   Successful ultrasound-guided diagnostic and therapeutic paracentesis yielding 2.2 liters of peritoneal fluid   12/20/2013 Pathology Results   PERITONEAL/ASCITIC FLUID(SPECIMEN 1 OF 1 COLLECTED 12/20/13): MALIGNANT CELLS CONSISTENT WITH SEROUS CARCINOMA   01/13/2014 Tumor Marker   Patient's tumor was tested for the following markers: CA125 Results of the tumor marker test revealed 227   02/07/2014 Tumor Marker   Patient's tumor was tested for the following markers: CA125 Results of the tumor marker test revealed 130   02/15/2014 Genetic Testing   Genetics testing from 02-2014 normal ([employer] panel)   02/23/2014 Imaging   Interval decrease in ascites. 2. Morphologic changes in the liver consistent with cirrhosis. 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 Portal vein remains patent at this time. 3. Persistent but decreased abnormal soft tissue attenuation tracking in the omental fat. This may be secondary to interval improvement in metastatic disease. 4. Peritoneal thickening seen in the left abdomen and pelvis on the previous study has decreased and nearly resolved in the interval. This also suggests interval improvement in metastatic disease.     03/07/2014 Procedure   Ultrasound and fluoroscopically guided right internal jugular single lumen power port catheter insertion. Tip in the SVC/RA junction. Catheter ready for use.   03/17/2014  Tumor Marker   Patient's tumor was tested for the following markers: CA125 Results of the tumor marker test revealed 45   03/29/2014 Pathology Results   1. Omentum, resection for tumor - HIGH GRADE SEROUS CARCINOMA, SEE COMMENT. 2. Ovary and fallopian tube, right - HIGH GRADE SEROUS CARCINOMA, SEE COMMENT. 3. Ovary and fallopian tube, left - HIGH GRADE SEROUS CARCINOMA, SEE COMMENT. Diagnosis Note 1. Nests and clusters of malignant cells are invading the omental tissue (part #1) with associated fibrosis. The cells are pleomorphic with prominent nucleoli. There are scattered psammoma bodies. The ovaries are atrophic and exhibit multiple foci of surface based invasive carcinoma and associated fibrosis. The fallopian tubes have a few foci of carcinoma, also superficially located. There are no precursor lesions noted in the ovaries or fallopian tubes (the entire tubes and ovaries were submitted for evaluation). While there is some retraction artifact, there are several foci suspicious for lymphovascular invasion. 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 Given the fibrosis, there does appear to be a small amount of treatment effect, however, there is abundant residual tumor.    03/29/2014 Surgery   Procedure(s) Performed: 1. Exploratory laparotomy with bilateral salpingo-oophorectomy, omentectomy radical tumor debulking for ovarian cancer .   Surgeon: Thereasa Solo, MD.   Assistant Surgeon: Lahoma Crocker, M.D. Assistant: (an MD assistant was necessary for tissue manipulation, retraction and positioning due to  the complexity of the case and hospital policies).  Operative Findings: 10cm omental cake from hepatic to splenic flexure, densely adherent to transverse colon. Milliary studding of tumor implants (<69m, too numerous in number to count) adherent to the  mesentery of the small bowel and small bowel wall. Small volume (100cc) ascites. Small ovaries bilaterally, densely adherent to the pelvic cul de sac. Left ovary and tube densely adherent to sigmoid colon. Cirrhotic liver with hepatomegaly and splenomegaly.     This represented an optimal cytoreduction (R1) with visible disease residual on the bowel wall and mesentery (millial, <358mimplants).      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 2. 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     08/18/2014 Tumor Marker   Patient's tumor was tested for the following markers: CA125 Results of the tumor marker test revealed 16   10/13/2014 Tumor Marker   Patient's tumor was tested for the following markers: CA125 Results of the tumor marker test revealed 14   11/21/2014 Tumor Marker   Patient's tumor was tested for the following markers: CA125 Results of the tumor marker test revealed 16   12/28/2014 Tumor Marker   Patient's tumor was tested for the following markers: CA125 Results of the tumor marker test revealed 762   02/27/2015 Tumor Marker   Patient's tumor was tested for the following markers: CA125 Results of the tumor marker test revealed 20.2   03/22/2015 Imaging   Filler appearance to the prior exam, with very subtle fluid tracking along portions of the liver, and very minimal nodularity along the right paracolic gutter representing residua from the prior peritoneal cancer. The current abnormalities could  simply be post therapy findings rather than necessarily representing residual malignancy. No new or enlarging lesions are identified. 2. Hepatic cirrhosis and splenomegaly. There is some gastric varices suggesting portal venous hypertension. 3. Left foraminal stenosis at L4-5 due to spurring. There is likely also some central narrowing of the thecal sac at this level. 4. Bibasilar scarring. 5. Chronic mild left mid kidney scarring   03/27/2015 Tumor Marker   Patient's tumor was tested for the following markers: CA125 Results of the tumor marker test revealed 25.3   04/24/2015 Imaging   Disc bulge L2-3 with mild spinal stenosis and right foraminal encroachment. 2. Disc bulge L3-4 with mild spinal stenosis. 3. Multifactorial spinal, left lateral recess and foraminal stenosis L4-5. There is grade 1 anterolisthesis without evident dynamic instability.   05/29/2015 Tumor Marker   Patient's tumor was tested for the following markers: CA125 Results of the tumor marker test revealed 29.9   08/21/2015 Tumor Marker   Patient's tumor was tested for the following markers: CA125 Results of the tumor marker test revealed 45   09/18/2015 Tumor Marker   Patient's tumor was tested for the following markers: CA125 Results of the tumor marker test revealed 47   09/27/2015 Imaging   New small bowel mesenteric nodule and enlarged left external iliac lymph node, highly worrisome for metastatic disease. 2. Cirrhosis  and splenomegaly   10/10/2015 Tumor Marker   Patient's tumor was tested for the following markers: CA125 Results of the tumor marker test revealed 53.4   10/10/2015 - 12/11/2015 Chemotherapy   She received 4 cycles of carboplatin only   11/20/2015 Tumor Marker   Patient's tumor was tested for the following markers: CA125 Results of the tumor marker test revealed 41.3   12/20/2015 Imaging   CT: Mild left external iliac lymphadenopathy is slightly decreased. Mildly enlarged lower mesenteric nodule is  slightly decreased. 2. No new or progressive metastatic disease in the abdomen or pelvis. 3. Cirrhosis. Stable subcentimeter hypodense left liver lobe lesion. No new liver lesions. 4. Stable mild splenomegaly.  No ascites. 5. Mild sigmoid diverticulosis.   01/01/2016 -  Anti-estrogen oral therapy   She was placed on tamoxifen. Had letrozole initially but switched to tamoxifen due to poor tolerance   01/15/2016 Tumor Marker   Patient's tumor was tested for the following markers: CA125 Results of the tumor marker test revealed 31.4   02/19/2016 Tumor Marker   Patient's tumor was tested for the following markers: CA125 Results of the tumor marker test revealed 36.5   05/13/2016 Imaging   No evidence of disease progression within the abdomen or pelvis. Previously noted small central mesenteric nodule and left external iliac node appear slightly smaller. 2. Stable changes of hepatic cirrhosis and portal hypertension with associated splenomegaly. No new or enlarging hepatic lesions are identified. 3. No acute findings.   05/13/2016 Tumor Marker   Patient's tumor was tested for the following markers: CA125 Results of the tumor marker test revealed 41.2   06/24/2016 Tumor Marker   Patient's tumor was tested for the following markers: CA125 Results of the tumor marker test revealed 47.3   08/20/2016 Imaging   1. The left external iliac lymph node is slightly increased in size compared to the prior exam, currently 1.1 cm and previously 0.9 cm in diameter. 2. Stable central mesenteric lymph node at 0.9 cm in diameter. 3. Stable trace free pelvic fluid and stable slight thickening along the right paracolic gutter, without well-defined peritoneal nodularity. 4. Other imaging findings of potential clinical significance: Subsegmental atelectasis or scarring in the lung bases. Hepatic cirrhosis. Left renal scarring. Mild splenomegaly. Sigmoid colon diverticulosis. Impingement at L4-5 due to spondylosis and  degenerative disc disease broad Schmorl' s nodes at L3-L4. Pelvic floor laxity   08/23/2016 Tumor Marker   Patient's tumor was tested for the following markers: CA125 Results of the tumor marker test revealed 80.2   09/04/2016 Imaging   LV EF: 60% -   65%   09/12/2016 Tumor Marker   Patient's tumor was tested for the following markers: CA125 Results of the tumor marker test revealed 98.5   09/12/2016 - 01/29/2018 Chemotherapy   The patient received Doxil and Avastin. Avastin was discontinued in Dec 2019 due to GI hemorrhage   09/26/2016 Tumor Marker   Patient's tumor was tested for the following markers: CA125 Results of the tumor marker test revealed 68.9   10/10/2016 Tumor Marker   Patient's tumor was tested for the following markers: CA125 Results of the tumor marker test revealed 65.6   11/07/2016 Tumor Marker   Patient's tumor was tested for the following markers: CA125 Results of the tumor marker test revealed 46.4   11/29/2016 Imaging   Stable mild peritoneal thickening, suspicious for peritoneal carcinomatosis. No new or progressive disease identified. No evidence of ascites.  No significant change in 10 mm left external iliac  and 8 mm mesenteric lymph nodes.  Stable hepatic cirrhosis, and splenomegaly consistent with portal venous hypertension.  Colonic diverticulosis, without radiographic evidence of diverticulitis.   12/02/2016 Imaging   Normal LV size with EF 55%. Basal inferior and basal inferoseptal hypokinesis. Normal RV size and systolic function. No significant valvular abnormalities.   12/05/2016 Tumor Marker   Patient's tumor was tested for the following markers: CA125 Results of the tumor marker test revealed 49.1   01/09/2017 Tumor Marker   Patient's tumor was tested for the following markers: CA125 Results of the tumor marker test revealed 47.2   02/06/2017 Tumor Marker   Patient's tumor was tested for the following markers: CA125 Results of the tumor  marker test revealed 44.1   02/18/2017 Imaging   Stable mild peritoneal thickening. No new or progressive disease identified within the abdomen or pelvis.  Stable tiny sub-cm left external iliac and mesenteric lymph nodes.  Stable hepatic cirrhosis, and splenomegaly consistent with portal venous hypertension. No evidence of hepatic neoplasm.  Colonic diverticulosis, without radiographic evidence of diverticulitis.   03/06/2017 Tumor Marker   Patient's tumor was tested for the following markers: CA125 Results of the tumor marker test revealed 50.4   03/17/2017 Imaging   - Left ventricle: The cavity size was normal. Wall thickness was normal. Systolic function was normal. The estimated ejection fraction was in the range of 55% to 60%. Wall motion was normal; there were no regional wall motion abnormalities. Features are consistent with a pseudonormal left ventricular filling pattern, with concomitant abnormal relaxation and increased filling pressure (grade 2 diastolic dysfunction). - Mitral valve: There was mild regurgitation. - Left atrium: The atrium was mildly dilated   04/03/2017 Tumor Marker   Patient's tumor was tested for the following markers: CA125 Results of the tumor marker test revealed 47.2   05/14/2017 Imaging   CT scan of abdomen and pelvis Stable mild peritoneal thickening. No new or progressive disease within the abdomen or pelvis.  Stable hepatic cirrhosis. Stable splenomegaly, consistent with portal venous hypertension. No evidence of hepatic neoplasm.   05/15/2017 Tumor Marker   Patient's tumor was tested for the following markers: CA125 Results of the tumor marker test revealed 56.1   06/12/2017 Tumor Marker   Patient's tumor was tested for the following markers: CA125 Results of the tumor marker test revealed 49.2   07/10/2017 Tumor Marker   Patient's tumor was tested for the following markers: CA125 Results of the tumor marker test revealed 46.3   08/07/2017 Tumor  Marker   Patient's tumor was tested for the following markers: CA125 Results of the tumor marker test revealed 52.9   08/20/2017 Imaging   1. Mild omental/peritoneal haziness, unchanged. No evidence of new metastatic disease. 2. Cirrhosis with splenomegaly.   09/04/2017 Tumor Marker   Patient's tumor was tested for the following markers: CA125 Results of the tumor marker test revealed 48.5   11/13/2017 Tumor Marker   Patient's tumor was tested for the following markers: CA125 Results of the tumor marker test revealed 55.8   12/17/2017 Imaging   1. No findings to suggest metastatic disease in the abdomen or pelvis. 2. Severe hepatic cirrhosis with evidence of portal hypertension, as demonstrated by dilated portal vein, splenomegaly and portosystemic collateral pathways, including gastric and esophageal varices.  3. Colonic diverticulosis without evidence of acute diverticulitis at this time. 4. Additional incidental findings, as above.   01/16/2018 Tumor Marker   Patient's tumor was tested for the following markers: CA125 Results of the  tumor marker test revealed 63.7   02/10/2018 Procedure   EGD - Recently bleeding grade III and large (> 5 mm) esophageal varices. Completely eradicated. Banded. - Portal hypertensive gastropathy. - Normal examined duodenum. - No specimens collected   02/13/2018 Tumor Marker   Patient's tumor was tested for the following markers: CA125 Results of the tumor marker test revealed 82.7   02/25/2018 Imaging   Bone density showed mild osteopenia   04/08/2018 Tumor Marker   Patient's tumor was tested for the following markers: CA125 Results of the tumor marker test revealed 82.7   04/08/2018 Imaging   1. No definite omental or peritoneal surface lesions. However, there are 2 slowly enlarging lymph nodes noted. One is in the small bowel mesentery and the other is in the left deep pelvis. Could not exclude recurrent disease. 2. Stable advanced cirrhotic  changes involving the liver with portal venous hypertension, portal venous collaterals, esophageal varices and splenomegaly. No worrisome hepatic lesions. 3. Diffuse wall thickening of the colon could suggest diffuse colitis or could be due to low albumin. Recommend correlation with any symptoms such as diarrhea.   05/21/2018 Tumor Marker   Patient's tumor was tested for the following markers: CA125 Results of the tumor marker test revealed 86   09/07/2018 Tumor Marker   Patient's tumor was tested for the following markers:CA-125 Results of the tumor marker test revealed 111   09/29/2018 Tumor Marker   Patient's tumor was tested for the following markers: CA-125 Results of the tumor marker test revealed 121.   09/29/2018 - 11/24/2018 Chemotherapy   The patient had carboplatin and gemzar for chemotherapy treatment.  Chemo was stopped due to allergic reaction to carboplatin   10/27/2018 Tumor Marker   Patient's tumor was tested for the following markers: CA-125 Results of the tumor marker test revealed 99.9   11/24/2018 Tumor Marker   Patient's tumor was tested for the following markers: CA-125 Results of the tumor marker test revealed 81.2   12/14/2018 Tumor Marker   Patient's tumor was tested for the following markers: CA-125 Results of the tumor marker test revealed 81.2   12/14/2018 Imaging   1. Mild improvement in peritoneal disease. Peritoneal nodule within the left posterior pelvis contiguous with the left side of vaginal cuff is slightly decreased in size in the interval. Within the right iliac fossa there is a small peritoneal nodule which has decreased in size in the interval. Central small bowel mesenteric lymph node is not significantly changed. Slight decrease in size left periaortic lymph node.    2. Morphologic features of the liver compatible with cirrhosis. Splenomegaly is noted which may reflect portal venous hypertension.   12/21/2018 Tumor Marker   Patient's tumor was  tested for the following markers: CA-125 Results of the tumor marker test revealed 64.2   12/21/2018 -  Chemotherapy   The patient had taxotere for chemotherapy treatment.     03/02/2019 Tumor Marker   Patient's tumor was tested for the following markers: CA-125. 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   No new or progressive metastatic disease within the abdomen or pelvis.   Hepatic cirrhosis and findings of portal venous hypertension. 1.1 cm low-attenuation lesion within left hepatic lobe, which could represent a regenerative nodule or small hepatocellular carcinoma. Recommend abdomen MRI without and with contrast for further characterization.     04/13/2019 Tumor Marker   Patient's tumor was tested  for the following markers: CA-125 Results of the tumor marker test revealed 36.8   05/26/2019 Tumor Marker   Patient's tumor was tested for the following markers: CA-125 Results of the tumor marker test revealed 31.6.   06/15/2019 Tumor Marker   Patient's tumor was tested for the following markers: CA-125 Results of the tumor marker test revealed 25.4   07/05/2019 Imaging   1. Stable CT examination. Stable peritoneal thickening in the pelvis and paracolic gutters. Stable solid left deep pelvic implant. Stable trace ascites. No new or progressive metastatic disease. 2. Stable cirrhosis. No discrete liver masses. 3. Stable mild-to-moderate splenomegaly. 4. Aortic Atherosclerosis (ICD10-I70.0).   07/27/2019 Tumor Marker   Patient's tumor was tested for the following markers: CA-125 Results of the tumor marker test revealed 35.4   08/17/2019 Tumor Marker   Patient's tumor was tested for the following markers: Ca-125 Results of the tumor marker test revealed 35.7.   09/17/2019 - 09/21/2019 Hospital Admission   She was admitted due to esophageal variceal bleeding presents with hematemesis and  melanotic stool with epigastric pain.  GI consulted, started on octreotide infusion.  Underwent EGD which showed minimal bleeding, remain on octreotide drip, PPI drip.  Hemoglobin trended down therefore received PRBC transfusion.  Over several days her bleeding subsided, hemodynamically remained stable.    09/17/2019 Procedure   EGD report - Esophageal mucosal scarring, from prior esophageal banding sessions. - Grade I esophageal varices. - Red blood in the cardia and in the gastric fundus. - Portal hypertensive gastropathy. - Normal duodenal bulb, first portion of the duodenum and second portion of the duodenum. 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 2. Otherwise stable CT exam. Stable peritoneal thickening within the bilateral deep pelvic peritoneum. Stable left deep pelvic implant.No evidence for progressed disease. 3. Stable splenomegaly. 4. Morphologic changes to the liver compatible with cirrhosis. Trace perihepatic and pelvic ascites.   02/22/2020 Imaging   1. New lucent lesion in the left side of the L4 vertebral body measuring 1 cm, concerning for possible osseous metastasis. Consideration for further evaluation with bone scan is suggested. Close attention on follow-up imaging is also recommended to ensure stability. 2. Previously noted peritoneal thickening and nodularity in the low anatomic pelvis has regressed slightly. 3. Borderline enlarged and mildly enlarged pelvic and retroperitoneal lymph nodes appear similar to slightly increased compared to the prior examination, as above. Continued  attention on follow-up studies is recommended.  4. 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 5. Aortic atherosclerosis. 6. Additional incidental findings, as above.   02/22/2020 Tumor Marker   Patient's tumor was tested for the following markers: CA-125 Results of the tumor marker test revealed 30.1   04/06/2020 Tumor Marker   Patient's tumor was tested for the following markers: CA-125 Results of the tumor marker test revealed 35.7   09/11/2020 Tumor Marker   Patient's tumor was tested for the following markers: CA-125. Results of the tumor marker test revealed 26.6.   09/12/2020 Imaging   1. Slow enlargement of a currently 2.9 cubic cm soft tissue nodule along the left adnexa. Tumor deposit not excluded. 2. The left external  iliac lymph node has substantially reduced in size, currently 0.9 cm in short axis and formerly 1.4 cm. 3. 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 4. Cirrhosis with portal venous hypertension. 5. Mild distal esophageal wall thickening, probably from esophagitis. Splenomegaly. 6. Scarring in the left kidney. 7. Sigmoid colon diverticulosis. 8. Pelvic floor laxity with small cystocele. 9. Lumbar impingement at L2-3, L3-4, and L4-5.     10/23/2020 Tumor Marker   Patient's tumor was tested for the following markers: CA-125. Results of the tumor marker test revealed 35.3.   12/12/2020 Tumor Marker   Patient's tumor was tested for the following markers: CA-125. Results of the tumor marker test revealed 39.1.   12/13/2020 Imaging   1. Continued increase in size of soft tissue nodule within the left posterior pelvis adjacent to the sigmoid colon. Cannot exclude small tumor deposit. 2. Similar appearance of thickening and mild nodularity along the peritoneal reflections of the pelvis  and pericolic gutters. 3. Morphologic findings of the liver compatible with cirrhosis. 4. Splenomegaly. 5. Aortic Atherosclerosis (ICD10-I70.0).   01/24/2021 Tumor Marker   Patient's tumor was tested for the following markers: CA-125. Results of the tumor marker test revealed 56.2.     REVIEW OF SYSTEMS:   Constitutional: Denies fevers, chills or abnormal weight loss Eyes: Denies blurriness of vision Ears, nose, mouth, throat, and face: Denies mucositis or sore throat Respiratory: Denies cough, dyspnea or wheezes Cardiovascular: Denies palpitation, chest discomfort Gastrointestinal:  Denies nausea, heartburn or change in bowel habits Skin: Denies abnormal skin rashes Lymphatics: Denies new lymphadenopathy or easy bruising Neurological:Denies numbness, tingling or new weaknesses Behavioral/Psych: Mood is stable, no new changes  Extremities: No lower extremity edema All other systems were reviewed with the patient and are negative.  I have reviewed the past medical history, past surgical history, social history and family history with the patient and they are unchanged from previous note.  ALLERGIES:  is allergic to carboplatin, shellfish allergy, vancomycin, benadryl [diphenhydramine hcl], and penicillins.  MEDICATIONS:  Current Outpatient Medications  Medication Sig Dispense Refill   calcium elemental as carbonate (BARIATRIC TUMS ULTRA) 400 MG chewable tablet Chew 1 tablet by mouth 2 (two) times daily.     Cholecalciferol (VITAMIN D) 50 MCG (2000 UT) tablet Take 2,000 Units by mouth at bedtime.     gabapentin (NEURONTIN) 300 MG capsule TAKE 3 CAPSULES(900 MG) BY MOUTH TWICE DAILY (Patient taking differently: 2 (two) times daily.) 120 capsule 11   HYDROcodone-acetaminophen (NORCO/VICODIN) 5-325 MG tablet Take 1 tablet by mouth every 6 (six) hours as needed for moderate pain. 60 tablet 0   Insulin Degludec 100 UNIT/ML SOLN Inject 30 Units into the skin daily.      lidocaine-prilocaine (EMLA) cream Apply to Porta-cath  1-2 hours prior to access as directed. (Patient taking differently: Apply 1 application topically daily as needed (port access).) 30 g 9   ondansetron (ZOFRAN) 8 MG tablet TAKE 1 TABLET(8 MG) BY MOUTH EVERY 8 HOURS AS NEEDED 60 tablet 1   pantoprazole (PROTONIX) 40 MG tablet Take 1 tablet (40 mg total) by mouth daily.     prochlorperazine (COMPAZINE) 10 MG tablet Take 1 tablet (10 mg total) by mouth every 6 (six) hours as needed (Nausea or vomiting). 30 tablet 1   rotigotine (NEUPRO) 4 MG/24HR APPLY 1 PATCH TOPICALLY TO THE SKIN DAILY 90 patch 3   Semaglutide,0.25 or 0.5MG/DOS, (OZEMPIC, 0.25 OR 0.5 MG/DOSE,) 2  MG/1.5ML SOPN Inject 0.5 mg into the skin once a week.     SEMAGLUTIDE,0.25 OR 0.5MG/DOS, Sharon Inject 0.25 mg into the skin once a week.     No current facility-administered medications for this visit.    PHYSICAL EXAMINATION: ECOG PERFORMANCE STATUS: 1 - Symptomatic but completely ambulatory  LABORATORY DATA:  I have reviewed the data as listed CMP Latest Ref Rng & Units 01/24/2021 12/12/2020 10/23/2020  Glucose 70 - 99 mg/dL 93 92 92  BUN 8 - 23 mg/dL 17 16 13   Creatinine 0.44 - 1.00 mg/dL 0.91 1.14(H) 0.86  Sodium 135 - 145 mmol/L 142 141 143  Potassium 3.5 - 5.1 mmol/L 4.3 4.4 4.3  Chloride 98 - 111 mmol/L 108 106 107  CO2 22 - 32 mmol/L 25 27 25   Calcium 8.9 - 10.3 mg/dL 8.6(L) 8.9 9.2  Total Protein 6.5 - 8.1 g/dL 7.5 7.4 7.7  Total Bilirubin 0.3 - 1.2 mg/dL 0.7 0.8 1.0  Alkaline Phos 38 - 126 U/L 97 84 86  AST 15 - 41 U/L 22 22 24   ALT 0 - 44 U/L 22 26 30     Lab Results  Component Value Date   WBC 5.1 01/24/2021   HGB 13.0 01/24/2021   HCT 38.2 01/24/2021   MCV 93.2 01/24/2021   PLT 90 (L) 01/24/2021   NEUTROABS 3.7 01/24/2021    I discussed the assessment and treatment plan with the patient. The patient was provided an opportunity to ask questions and all were answered. The patient agreed with the plan and  demonstrated an understanding of the instructions. The patient was advised to call back or seek an in-person evaluation if the symptoms worsen or if the condition fails to improve as anticipated.    I spent 20 minutes for the appointment reviewing test results, discuss management and coordination of care.  [name], MD 01/25/2021 9:13 AM

## 2021-02-08 ENCOUNTER — Ambulatory Visit (HOSPITAL_COMMUNITY)
Admission: RE | Admit: 2021-02-08 | Discharge: 2021-02-08 | Disposition: A | Discharge status: Home / Self Care | Payer: Medicare Other | Source: Ambulatory Visit | Attending: Hematology and Oncology | Admitting: Hematology and Oncology

## 2021-02-08 ENCOUNTER — Other Ambulatory Visit: Payer: Self-pay

## 2021-02-08 DIAGNOSIS — C482 Malignant neoplasm of peritoneum, unspecified: Secondary | ICD-10-CM | POA: Diagnosis present

## 2021-02-08 MED ORDER — SODIUM CHLORIDE (PF) 0.9 % IJ SOLN
INTRAMUSCULAR | Status: AC
  Filled 2021-02-08: qty 50

## 2021-02-08 MED ORDER — IOHEXOL 9 MG/ML PO SOLN
500.0000 mL | ORAL | Status: AC
  Administered 2021-02-08 (×2): 500 mL via ORAL

## 2021-02-08 MED ORDER — IOHEXOL 350 MG/ML SOLN
80.0000 mL | Freq: Once | INTRAVENOUS | Status: AC | PRN
  Administered 2021-02-08: 13:00:00 80 mL via INTRAVENOUS

## 2021-02-08 MED ORDER — HEPARIN SOD (PORK) LOCK FLUSH 100 UNIT/ML IV SOLN
500.0000 [IU] | Freq: Once | INTRAVENOUS | Status: AC
  Administered 2021-02-08: 13:00:00 500 [IU] via INTRAVENOUS

## 2021-02-13 ENCOUNTER — Inpatient Hospital Stay: Payer: Medicare Other | Attending: Hematology and Oncology | Admitting: Hematology and Oncology

## 2021-02-13 ENCOUNTER — Other Ambulatory Visit: Payer: Self-pay

## 2021-02-13 ENCOUNTER — Encounter: Payer: Self-pay | Admitting: Hematology and Oncology

## 2021-02-13 VITALS — BP 130/76 | HR 66 | Temp 97.9°F | Resp 18 | Ht 61.0 in | Wt 179.0 lb

## 2021-02-13 DIAGNOSIS — K7460 Unspecified cirrhosis of liver: Secondary | ICD-10-CM | POA: Diagnosis not present

## 2021-02-13 DIAGNOSIS — M47816 Spondylosis without myelopathy or radiculopathy, lumbar region: Secondary | ICD-10-CM | POA: Insufficient documentation

## 2021-02-13 DIAGNOSIS — E119 Type 2 diabetes mellitus without complications: Secondary | ICD-10-CM | POA: Insufficient documentation

## 2021-02-13 DIAGNOSIS — Z7189 Other specified counseling: Secondary | ICD-10-CM

## 2021-02-13 DIAGNOSIS — G629 Polyneuropathy, unspecified: Secondary | ICD-10-CM | POA: Diagnosis not present

## 2021-02-13 DIAGNOSIS — Z79899 Other long term (current) drug therapy: Secondary | ICD-10-CM | POA: Diagnosis not present

## 2021-02-13 DIAGNOSIS — G8929 Other chronic pain: Secondary | ICD-10-CM

## 2021-02-13 DIAGNOSIS — R188 Other ascites: Secondary | ICD-10-CM | POA: Diagnosis not present

## 2021-02-13 DIAGNOSIS — C482 Malignant neoplasm of peritoneum, unspecified: Secondary | ICD-10-CM | POA: Insufficient documentation

## 2021-02-13 DIAGNOSIS — E1142 Type 2 diabetes mellitus with diabetic polyneuropathy: Secondary | ICD-10-CM | POA: Diagnosis not present

## 2021-02-13 DIAGNOSIS — K5909 Other constipation: Secondary | ICD-10-CM | POA: Diagnosis not present

## 2021-02-13 DIAGNOSIS — I700 Atherosclerosis of aorta: Secondary | ICD-10-CM | POA: Diagnosis not present

## 2021-02-13 DIAGNOSIS — M545 Low back pain, unspecified: Secondary | ICD-10-CM | POA: Insufficient documentation

## 2021-02-13 DIAGNOSIS — Z794 Long term (current) use of insulin: Secondary | ICD-10-CM | POA: Insufficient documentation

## 2021-02-13 DIAGNOSIS — R161 Splenomegaly, not elsewhere classified: Secondary | ICD-10-CM | POA: Diagnosis not present

## 2021-02-13 DIAGNOSIS — E1140 Type 2 diabetes mellitus with diabetic neuropathy, unspecified: Secondary | ICD-10-CM | POA: Diagnosis not present

## 2021-02-13 DIAGNOSIS — D696 Thrombocytopenia, unspecified: Secondary | ICD-10-CM | POA: Insufficient documentation

## 2021-02-13 NOTE — Assessment & Plan Note (Signed)
She has persistent peripheral neuropathy She is taking multiple different medications to control her diabetes We discussed risk of worsening neuropathy while on treatment and right now, the plan would be to delay initiation of chemotherapy and for her to focus on risk factor modification

## 2021-02-13 NOTE — Progress Notes (Signed)
[facility] OFFICE PROGRESS NOTE  Patient Care Team: Jolinda Croak, MD as PCP - General (Family Medicine) Tat, Eustace Quail, DO as Consulting Physician (Neurology)  ASSESSMENT & PLAN:  Primary peritoneal carcinomatosis [facility]) I have reviewed her blood work and imaging studies with the patient and her husband She has very slight gradual slow disease progression with gradual increase in CA125 value and detectable peritoneal disease on imaging study However, the patient is completely asymptomatic The pace of disease progression is very slow We have extensive discussions about the risk and benefits of delaying initiation of treatment versus treating her now Due to lack of symptoms, the patient favor close surveillance observation with serial imaging studies I plan to repeat imaging study again in 2 months for further follow-up In the meantime, she is educated to watch out for signs and symptoms of disease progression  Thrombocytopenia ([place]) 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 It is stable She has no recent bleeding Observe closely for now  Low back pain She has chronic back pain  I refilled her prescription of pain medicine recently  Diabetes mellitus with diabetic neuropathy, with long-term current use of insulin ([place]) She has persistent peripheral neuropathy She is taking multiple different medications to control her diabetes We discussed risk of worsening neuropathy while on treatment and right now, the plan would be to delay initiation of chemotherapy and for her to focus on risk factor modification  Goals of care, counseling/discussion We had extensive discussions in the past about the role of palliative chemotherapy Overall, she is comfortable with close observation and surveillance without pursuing treatment right now  Orders Placed This Encounter  Procedures   CT ABDOMEN PELVIS W CONTRAST    Standing Status:   Future     Standing Expiration Date:   02/13/2022    Order Specific Question:   If indicated for the ordered procedure, I authorize the administration of contrast media per Radiology protocol    Answer:   Yes    Order Specific Question:   Preferred imaging location?    Answer:   [facility]    Order Specific Question:   Radiology Contrast Protocol - do NOT remove file path    Answer:   [email or web address]    All questions were answered. The patient knows to call the clinic with any problems, questions or concerns. The total time spent in the appointment was 30 minutes encounter with patients including review of chart and various tests results, discussions about plan of care and coordination of care plan   [name], MD 02/13/2021 9:48 AM  INTERVAL HISTORY: Please see below for problem oriented charting. she returns for surveillance follow-up with her husband She has chronic constipation, stable Denies recent bloating, pelvic pain or vaginal bleeding  REVIEW OF SYSTEMS:   Constitutional: Denies fevers, chills or abnormal weight loss Eyes: Denies blurriness of vision Ears, nose, mouth, throat, and face: Denies mucositis or sore throat Respiratory: Denies cough, dyspnea or wheezes Cardiovascular: Denies palpitation, chest discomfort or lower extremity swelling Skin: Denies abnormal skin rashes Lymphatics: Denies new lymphadenopathy or easy bruising Neurological:Denies numbness, tingling or new weaknesses Behavioral/Psych: Mood is stable, no new changes  All other systems were reviewed with the patient and are negative.  I have reviewed the past medical history, past surgical history, social history and family history with the patient and they are unchanged from previous note.  ALLERGIES:  is allergic to carboplatin, shellfish allergy, vancomycin,  benadryl [diphenhydramine hcl], and penicillins.  MEDICATIONS:  Current Outpatient Medications   Medication Sig Dispense Refill   calcium elemental as carbonate (BARIATRIC TUMS ULTRA) 400 MG chewable tablet Chew 1 tablet by mouth 2 (two) times daily.     Cholecalciferol (VITAMIN D) 50 MCG (2000 UT) tablet Take 2,000 Units by mouth at bedtime.     gabapentin (NEURONTIN) 300 MG capsule TAKE 3 CAPSULES(900 MG) BY MOUTH TWICE DAILY (Patient taking differently: 2 (two) times daily.) 120 capsule 11   HYDROcodone-acetaminophen (NORCO/VICODIN) 5-325 MG tablet Take 1 tablet by mouth every 6 (six) hours as needed for moderate pain. 60 tablet 0   Insulin Degludec 100 UNIT/ML SOLN Inject 30 Units into the skin daily.     lidocaine-prilocaine (EMLA) cream Apply to Porta-cath  1-2 hours prior to access as directed. (Patient taking differently: Apply 1 application topically daily as needed (port access).) 30 g 9   ondansetron (ZOFRAN) 8 MG tablet TAKE 1 TABLET(8 MG) BY MOUTH EVERY 8 HOURS AS NEEDED 60 tablet 1   pantoprazole (PROTONIX) 40 MG tablet Take 1 tablet (40 mg total) by mouth daily.     prochlorperazine (COMPAZINE) 10 MG tablet Take 1 tablet (10 mg total) by mouth every 6 (six) hours as needed (Nausea or vomiting). 30 tablet 1   rotigotine (NEUPRO) 4 MG/24HR APPLY 1 PATCH TOPICALLY TO THE SKIN DAILY 90 patch 3   SEMAGLUTIDE,0.25 OR 0.5MG/DOS, [place] Inject 0.25 mg into the skin once a week.     No current facility-administered medications for this visit.    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 Had allergic reaction to carboplatin. Avastin was stopped due to GI hemorrhage   Primary peritoneal carcinomatosis ([place])  09/06/2011 Imaging   CT findings consistent with cirrhotic changes involving the liver.  No worrisome liver mass.  There are associated portal venous collaterals and splenomegaly consistent with portal venous hypertension. 2.  No other significant upper abdominal findings    11/08/2013 Imaging   US showed ascites   11/08/2013 Initial Diagnosis   Patient had ~ 2 months of some urinary incontinence, saw Dr Ronita Hipps in 10-2013, with pelvic mass on exam    11/12/2013 Imaging   There are findings of progressive hepatic cirrhosis with a large amount of ascites and mild splenomegaly. There are venous collaterals present. 2. There is abnormal thickening of the peritoneal surface along the left mid and lower abdominal wall. There is abnormal soft tissue density in the pelvis as well which could reflect the clinically suspected ovarian malignancy but the findings are nondiagnostic. 3. There is a new small left pleural effusion. 4. There is no acute bowel abnormality.   11/18/2013 Imaging   Successful ultrasound guided diagnostic and therapeutic paracentesis yielding 4.1 liters of ascites.     11/18/2013 Pathology Results   PERITONEAL/ASCITIC FLUID (SPECIMEN 1 OF 1 COLLECTED 11/18/13): SEROUS CARCINOMA, PLEASE SEE COMMENT.   12/01/2013 Tumor Marker   Patient's tumor was tested for the following markers: CA125 Results of the tumor marker test revealed 1938   12/03/2013 Imaging   Successful ultrasound-guided therapeutic paracentesis yielding 3.6 liters of peritoneal fluid.   12/07/2013 - 05/30/2014 Chemotherapy   She received 3 cycles of carboplatin and Taxol, interrupted cycle 4 for surgery.  She subsequently completed 3 more cycles of chemotherapy after surgery   12/20/2013 Imaging   Successful ultrasound-guided diagnostic and therapeutic paracentesis yielding 2.2 liters of peritoneal fluid  12/20/2013 Pathology Results   PERITONEAL/ASCITIC FLUID(SPECIMEN 1 OF 1 COLLECTED 12/20/13): MALIGNANT CELLS CONSISTENT WITH SEROUS CARCINOMA   01/13/2014 Tumor Marker   Patient's tumor was tested for the following markers: CA125 Results of the tumor marker test revealed 227   02/07/2014 Tumor Marker   Patient's tumor was tested for the following markers: CA125 Results of the tumor  marker test revealed 130   02/15/2014 Genetic Testing   Genetics testing from 02-2014 normal ([employer]va[employer] panel)   02/23/2014 Imaging   Interval decrease in ascites. 2. Morphologic changes in the liver consistent with cirrhosis. 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 Portal vein remains patent at this time. 3. Persistent but decreased abnormal soft tissue attenuation tracking in the omental fat. This may be secondary to interval improvement in metastatic disease. 4. Peritoneal thickening seen in the left abdomen and pelvis on the previous study has decreased and nearly resolved in the interval. This also suggests interval improvement in metastatic disease.     03/07/2014 Procedure   Ultrasound and fluoroscopically guided right internal jugular single lumen power port catheter insertion. Tip in the SVC/RA junction. Catheter ready for use.   03/17/2014 Tumor Marker   Patient's tumor was tested for the following markers: CA125 Results of the tumor marker test revealed 45   03/29/2014 Pathology Results   1. Omentum, resection for tumor - HIGH GRADE SEROUS CARCINOMA, SEE COMMENT. 2. Ovary and fallopian tube, right - HIGH GRADE SEROUS CARCINOMA, SEE COMMENT. 3. Ovary and fallopian tube, left - HIGH GRADE SEROUS CARCINOMA, SEE COMMENT. Diagnosis Note 1. Nests and clusters of malignant cells are invading the omental tissue (part #1) with associated fibrosis. The cells are pleomorphic with prominent nucleoli. There are scattered psammoma bodies. The ovaries are atrophic and exhibit multiple foci of surface based invasive carcinoma and associated fibrosis. The fallopian tubes have a few foci of carcinoma, also superficially located. There are no precursor lesions noted in the ovaries or fallopian tubes (the entire tubes and ovaries were submitted for evaluation). While there is some retraction artifact, there are several foci suspicious for lymphovascular invasion. 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 Given the fibrosis, there does appear to be a small amount of treatment effect, however, there is abundant residual tumor.    03/29/2014 Surgery   Procedure(s) Performed: 1. Exploratory laparotomy with bilateral salpingo-oophorectomy, omentectomy radical tumor debulking for ovarian cancer .   Surgeon: Thereasa Solo, MD.   Assistant Surgeon: Lahoma Crocker, M.D. Assistant: (an MD assistant was necessary for tissue manipulation, retraction and positioning due to the complexity of the case and hospital policies).  Operative Findings: 10cm omental cake from hepatic to splenic flexure, densely adherent to transverse colon. Milliary studding of tumor implants (<22m, too numerous in number to count) adherent to the mesentery of the small bowel and small bowel wall. Small volume (100cc) ascites. Small ovaries bilaterally, densely adherent to the pelvic cul de sac. Left ovary and tube densely adherent to sigmoid colon. Cirrhotic liver with hepatomegaly and splenomegaly.     This represented an optimal cytoreduction (R1) with visible disease residual on the bowel wall and mesentery (millial, <37mimplants).      04/18/2014 Tumor Marker   Patient's tumor was tested for the following markers: CA125 Results of the tumor marker test revealed 122   05/16/2014 Tumor Marker   Patient's tumor was  tested for the following markers: CA125 Results of the tumor marker test revealed 30   06/10/2014 Tumor Marker   Patient's tumor was tested for the following markers: CA125 Results of the tumor marker test revealed 19   07/04/2014 Imaging   Interval improvement in the appearance of peritoneal metastasis secondary to ovarian cancer. 2. 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     08/18/2014 Tumor Marker   Patient's tumor was tested  for the following markers: CA125 Results of the tumor marker test revealed 16   10/13/2014 Tumor Marker   Patient's tumor was tested for the following markers: CA125 Results of the tumor marker test revealed 14   11/21/2014 Tumor Marker   Patient's tumor was tested for the following markers: CA125 Results of the tumor marker test revealed 16   12/28/2014 Tumor Marker   Patient's tumor was tested for the following markers: CA125 Results of the tumor marker test revealed 762   02/27/2015 Tumor Marker   Patient's tumor was tested for the following markers: CA125 Results of the tumor marker test revealed 20.2   03/22/2015 Imaging   Filler appearance to the prior exam, with very subtle fluid tracking along portions of the liver, and very minimal nodularity along the right paracolic gutter representing residua from the prior peritoneal cancer. The current abnormalities could simply be post therapy findings rather than necessarily representing residual malignancy. No new or enlarging lesions are identified. 2. Hepatic cirrhosis and splenomegaly. There is some gastric varices suggesting portal venous hypertension. 3. Left foraminal stenosis at L4-5 due to spurring. There is likely also some central narrowing of the thecal sac at this level. 4. Bibasilar scarring. 5. Chronic mild left mid kidney scarring   03/27/2015 Tumor Marker   Patient's tumor was tested for the following markers: CA125 Results of the tumor marker test revealed 25.3   04/24/2015 Imaging   Disc bulge L2-3 with mild spinal stenosis and right foraminal encroachment. 2. Disc bulge L3-4 with mild spinal stenosis. 3. Multifactorial spinal, left lateral recess and foraminal stenosis L4-5. There is grade 1 anterolisthesis without evident dynamic instability.   05/29/2015 Tumor Marker   Patient's tumor was tested for the following markers: CA125 Results of the tumor marker test revealed 29.9   08/21/2015 Tumor Marker   Patient's tumor  was tested for the following markers: CA125 Results of the tumor marker test revealed 45   09/18/2015 Tumor Marker   Patient's tumor was tested for the following markers: CA125 Results of the tumor marker test revealed 47   09/27/2015 Imaging   New small bowel mesenteric nodule and enlarged left external iliac lymph node, highly worrisome for metastatic disease. 2. Cirrhosis and splenomegaly   10/10/2015 Tumor Marker   Patient's tumor was tested for the following markers: CA125 Results of the tumor marker test revealed 53.4   10/10/2015 - 12/11/2015 Chemotherapy   She received 4 cycles of carboplatin only   11/20/2015 Tumor Marker   Patient's tumor was tested for the following markers: CA125 Results of the tumor marker test revealed 41.3   12/20/2015 Imaging   CT: Mild left external iliac lymphadenopathy is slightly decreased. Mildly enlarged lower mesenteric nodule is slightly decreased. 2. No new or progressive metastatic disease in the abdomen or pelvis. 3. Cirrhosis. Stable subcentimeter hypodense left liver lobe lesion. No new liver lesions. 4. Stable mild splenomegaly.  No ascites. 5. Mild sigmoid diverticulosis.   01/01/2016 -  Anti-estrogen oral therapy   She was placed on  tamoxifen. Had letrozole initially but switched to tamoxifen due to poor tolerance   01/15/2016 Tumor Marker   Patient's tumor was tested for the following markers: CA125 Results of the tumor marker test revealed 31.4   02/19/2016 Tumor Marker   Patient's tumor was tested for the following markers: CA125 Results of the tumor marker test revealed 36.5   05/13/2016 Imaging   No evidence of disease progression within the abdomen or pelvis. Previously noted small central mesenteric nodule and left external iliac node appear slightly smaller. 2. Stable changes of hepatic cirrhosis and portal hypertension with associated splenomegaly. No new or enlarging hepatic lesions are identified. 3. No acute findings.   05/13/2016  Tumor Marker   Patient's tumor was tested for the following markers: CA125 Results of the tumor marker test revealed 41.2   06/24/2016 Tumor Marker   Patient's tumor was tested for the following markers: CA125 Results of the tumor marker test revealed 47.3   08/20/2016 Imaging   1. The left external iliac lymph node is slightly increased in size compared to the prior exam, currently 1.1 cm and previously 0.9 cm in diameter. 2. Stable central mesenteric lymph node at 0.9 cm in diameter. 3. Stable trace free pelvic fluid and stable slight thickening along the right paracolic gutter, without well-defined peritoneal nodularity. 4. Other imaging findings of potential clinical significance: Subsegmental atelectasis or scarring in the lung bases. Hepatic cirrhosis. Left renal scarring. Mild splenomegaly. Sigmoid colon diverticulosis. Impingement at L4-5 due to spondylosis and degenerative disc disease broad Schmorl' s nodes at L3-L4. Pelvic floor laxity   08/23/2016 Tumor Marker   Patient's tumor was tested for the following markers: CA125 Results of the tumor marker test revealed 80.2   09/04/2016 Imaging   LV EF: 60% -   65%   09/12/2016 Tumor Marker   Patient's tumor was tested for the following markers: CA125 Results of the tumor marker test revealed 98.5   09/12/2016 - 01/29/2018 Chemotherapy   The patient received Doxil and Avastin. Avastin was discontinued in Dec 2019 due to GI hemorrhage   09/26/2016 Tumor Marker   Patient's tumor was tested for the following markers: CA125 Results of the tumor marker test revealed 68.9   10/10/2016 Tumor Marker   Patient's tumor was tested for the following markers: CA125 Results of the tumor marker test revealed 65.6   11/07/2016 Tumor Marker   Patient's tumor was tested for the following markers: CA125 Results of the tumor marker test revealed 46.4   11/29/2016 Imaging   Stable mild peritoneal thickening, suspicious for peritoneal carcinomatosis. No  new or progressive disease identified. No evidence of ascites.  No significant change in 10 mm left external iliac and 8 mm mesenteric lymph nodes.  Stable hepatic cirrhosis, and splenomegaly consistent with portal venous hypertension.  Colonic diverticulosis, without radiographic evidence of diverticulitis.   12/02/2016 Imaging   Normal LV size with EF 55%. Basal inferior and basal inferoseptal hypokinesis. Normal RV size and systolic function. No significant valvular abnormalities.   12/05/2016 Tumor Marker   Patient's tumor was tested for the following markers: CA125 Results of the tumor marker test revealed 49.1   01/09/2017 Tumor Marker   Patient's tumor was tested for the following markers: CA125 Results of the tumor marker test revealed 47.2   02/06/2017 Tumor Marker   Patient's tumor was tested for the following markers: CA125 Results of the tumor marker test revealed 44.1   02/18/2017 Imaging   Stable mild peritoneal thickening. No new or  progressive disease identified within the abdomen or pelvis.  Stable tiny sub-cm left external iliac and mesenteric lymph nodes.  Stable hepatic cirrhosis, and splenomegaly consistent with portal venous hypertension. No evidence of hepatic neoplasm.  Colonic diverticulosis, without radiographic evidence of diverticulitis.   03/06/2017 Tumor Marker   Patient's tumor was tested for the following markers: CA125 Results of the tumor marker test revealed 50.4   03/17/2017 Imaging   - Left ventricle: The cavity size was normal. Wall thickness was normal. Systolic function was normal. The estimated ejection fraction was in the range of 55% to 60%. Wall motion was normal; there were no regional wall motion abnormalities. Features are consistent with a pseudonormal left ventricular filling pattern, with concomitant abnormal relaxation and increased filling pressure (grade 2 diastolic dysfunction). - Mitral valve: There was mild regurgitation. -  Left atrium: The atrium was mildly dilated   04/03/2017 Tumor Marker   Patient's tumor was tested for the following markers: CA125 Results of the tumor marker test revealed 47.2   05/14/2017 Imaging   CT scan of abdomen and pelvis Stable mild peritoneal thickening. No new or progressive disease within the abdomen or pelvis.  Stable hepatic cirrhosis. Stable splenomegaly, consistent with portal venous hypertension. No evidence of hepatic neoplasm.   05/15/2017 Tumor Marker   Patient's tumor was tested for the following markers: CA125 Results of the tumor marker test revealed 56.1   06/12/2017 Tumor Marker   Patient's tumor was tested for the following markers: CA125 Results of the tumor marker test revealed 49.2   07/10/2017 Tumor Marker   Patient's tumor was tested for the following markers: CA125 Results of the tumor marker test revealed 46.3   08/07/2017 Tumor Marker   Patient's tumor was tested for the following markers: CA125 Results of the tumor marker test revealed 52.9   08/20/2017 Imaging   1. Mild omental/peritoneal haziness, unchanged. No evidence of new metastatic disease. 2. Cirrhosis with splenomegaly.   09/04/2017 Tumor Marker   Patient's tumor was tested for the following markers: CA125 Results of the tumor marker test revealed 48.5   11/13/2017 Tumor Marker   Patient's tumor was tested for the following markers: CA125 Results of the tumor marker test revealed 55.8   12/17/2017 Imaging   1. No findings to suggest metastatic disease in the abdomen or pelvis. 2. Severe hepatic cirrhosis with evidence of portal hypertension, as demonstrated by dilated portal vein, splenomegaly and portosystemic collateral pathways, including gastric and esophageal varices.  3. Colonic diverticulosis without evidence of acute diverticulitis at this time. 4. Additional incidental findings, as above.   01/16/2018 Tumor Marker   Patient's tumor was tested for the following markers:  CA125 Results of the tumor marker test revealed 63.7   02/10/2018 Procedure   EGD - Recently bleeding grade III and large (> 5 mm) esophageal varices. Completely eradicated. Banded. - Portal hypertensive gastropathy. - Normal examined duodenum. - No specimens collected   02/13/2018 Tumor Marker   Patient's tumor was tested for the following markers: CA125 Results of the tumor marker test revealed 82.7   02/25/2018 Imaging   Bone density showed mild osteopenia   04/08/2018 Tumor Marker   Patient's tumor was tested for the following markers: CA125 Results of the tumor marker test revealed 82.7   04/08/2018 Imaging   1. No definite omental or peritoneal surface lesions. However, there are 2 slowly enlarging lymph nodes noted. One is in the small bowel mesentery and the other is in the left deep pelvis. Could  not exclude recurrent disease. 2. Stable advanced cirrhotic changes involving the liver with portal venous hypertension, portal venous collaterals, esophageal varices and splenomegaly. No worrisome hepatic lesions. 3. Diffuse wall thickening of the colon could suggest diffuse colitis or could be due to low albumin. Recommend correlation with any symptoms such as diarrhea.   05/21/2018 Tumor Marker   Patient's tumor was tested for the following markers: CA125 Results of the tumor marker test revealed 86   09/07/2018 Tumor Marker   Patient's tumor was tested for the following markers:CA-125 Results of the tumor marker test revealed 111   09/29/2018 Tumor Marker   Patient's tumor was tested for the following markers: CA-125 Results of the tumor marker test revealed 121.   09/29/2018 - 11/24/2018 Chemotherapy   The patient had carboplatin and gemzar for chemotherapy treatment.  Chemo was stopped due to allergic reaction to carboplatin   10/27/2018 Tumor Marker   Patient's tumor was tested for the following markers: CA-125 Results of the tumor marker test revealed 99.9   11/24/2018  Tumor Marker   Patient's tumor was tested for the following markers: CA-125 Results of the tumor marker test revealed 81.2   12/14/2018 Tumor Marker   Patient's tumor was tested for the following markers: CA-125 Results of the tumor marker test revealed 81.2   12/14/2018 Imaging   1. Mild improvement in peritoneal disease. Peritoneal nodule within the left posterior pelvis contiguous with the left side of vaginal cuff is slightly decreased in size in the interval. Within the right iliac fossa there is a small peritoneal nodule which has decreased in size in the interval. Central small bowel mesenteric lymph node is not significantly changed. Slight decrease in size left periaortic lymph node.    2. Morphologic features of the liver compatible with cirrhosis. Splenomegaly is noted which may reflect portal venous hypertension.   12/21/2018 Tumor Marker   Patient's tumor was tested for the following markers: CA-125 Results of the tumor marker test revealed 64.2   12/21/2018 -  Chemotherapy   The patient had taxotere for chemotherapy treatment.     03/02/2019 Tumor Marker   Patient's tumor was tested for the following markers: CA-125. 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   No new or progressive metastatic disease within the abdomen or pelvis.   Hepatic cirrhosis and findings of portal venous hypertension. 1.1 cm low-attenuation lesion within left hepatic lobe, which could represent a regenerative nodule or small hepatocellular carcinoma. Recommend abdomen MRI without and with contrast for further characterization.     04/13/2019 Tumor Marker   Patient's tumor was tested for the following markers: CA-125 Results of the tumor marker test revealed 36.8   05/26/2019 Tumor Marker   Patient's tumor was tested for the following markers: CA-125 Results of the tumor marker test revealed 31.6.    06/15/2019 Tumor Marker   Patient's tumor was tested for the following markers: CA-125 Results of the tumor marker test revealed 25.4   07/05/2019 Imaging   1. Stable CT examination. Stable peritoneal thickening in the pelvis and paracolic gutters. Stable solid left deep pelvic implant. Stable trace ascites. No new or progressive metastatic disease. 2. Stable cirrhosis. No discrete liver masses. 3. Stable mild-to-moderate splenomegaly. 4. Aortic Atherosclerosis (ICD10-I70.0).   07/27/2019 Tumor Marker   Patient's tumor was tested for the following markers: CA-125 Results of the tumor marker test revealed 35.4   08/17/2019 Tumor  Marker   Patient's tumor was tested for the following markers: Ca-125 Results of the tumor marker test revealed 35.7.   09/17/2019 - 09/21/2019 Hospital Admission   She was admitted due to esophageal variceal bleeding presents with hematemesis and melanotic stool with epigastric pain.  GI consulted, started on octreotide infusion.  Underwent EGD which showed minimal bleeding, remain on octreotide drip, PPI drip.  Hemoglobin trended down therefore received PRBC transfusion.  Over several days her bleeding subsided, hemodynamically remained stable.    09/17/2019 Procedure   EGD report - Esophageal mucosal scarring, from prior esophageal banding sessions. - Grade I esophageal varices. - Red blood in the cardia and in the gastric fundus. - Portal hypertensive gastropathy. - Normal duodenal bulb, first portion of the duodenum and second portion of the duodenum. 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 Small amount of nonocclusive thrombus demonstrated at the central aspect of the  superior mesenteric vein. 2. Otherwise stable CT exam. Stable peritoneal thickening within the bilateral deep pelvic peritoneum. Stable left deep pelvic implant.No evidence for progressed disease. 3. Stable splenomegaly. 4. Morphologic changes to the liver compatible with cirrhosis. Trace perihepatic and pelvic ascites.   02/22/2020 Imaging   1. New lucent lesion in the left side of the L4 vertebral body measuring 1 cm, concerning for possible osseous metastasis. Consideration for further evaluation with bone scan is suggested. Close attention on follow-up imaging is also recommended to ensure stability. 2. Previously noted peritoneal thickening and nodularity in the low anatomic pelvis has regressed slightly. 3. Borderline enlarged and mildly enlarged pelvic and retroperitoneal lymph nodes appear similar to slightly increased compared to the prior examination, as above. Continued attention on follow-up studies is recommended.  4. 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 5. Aortic atherosclerosis. 6. Additional incidental findings, as above.   02/22/2020 Tumor Marker   Patient's tumor was tested for the following markers: CA-125 Results of the tumor marker test revealed 30.1   04/06/2020 Tumor Marker   Patient's tumor was tested for the following markers: CA-125 Results of the tumor marker test revealed 35.7   09/11/2020 Tumor Marker   Patient's tumor was tested for the following markers: CA-125. Results of the tumor marker test revealed 26.6.   09/12/2020 Imaging   1. Slow enlargement of a currently 2.9 cubic cm soft tissue nodule along the left adnexa. Tumor deposit not excluded. 2. The left external iliac lymph node has substantially reduced in size, currently 0.9 cm in short axis and formerly 1.4 cm. 3. 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 4. Cirrhosis with portal venous hypertension. 5. Mild distal esophageal wall thickening, probably from esophagitis. Splenomegaly. 6. Scarring in the left kidney. 7. Sigmoid colon diverticulosis. 8. Pelvic floor laxity with small cystocele. 9. Lumbar impingement at L2-3, L3-4, and L4-5.     10/23/2020 Tumor Marker   Patient's tumor was tested for the following markers: CA-125. Results of the tumor marker test revealed 35.3.   12/12/2020 Tumor Marker   Patient's tumor was tested for the following markers: CA-125. Results of the tumor marker test revealed 39.1.   12/13/2020 Imaging   1. Continued  increase in size of soft tissue nodule within the left posterior pelvis adjacent to the sigmoid colon. Cannot exclude small tumor deposit. 2. Similar appearance of thickening and mild nodularity along the peritoneal reflections of the pelvis and pericolic gutters. 3. Morphologic findings of the liver compatible with cirrhosis. 4. Splenomegaly. 5. Aortic Atherosclerosis (ICD10-I70.0).   01/24/2021 Tumor Marker   Patient's tumor was tested for the following markers: CA-125. Results of the tumor marker test revealed 56.2.   02/09/2021 Imaging   1. Mild interval increased size of the solid left adnexal mass which may represent tumor recurrence/deposit. The mass abuts the adjacent sigmoid colon as well as the left posterior wall of the urinary bladder, invasion not excluded. 2. Otherwise no new mass or lymphadenopathy identified in the abdomen or pelvis. 3. Hepatic cirrhosis. Splenomegaly consistent with portal hypertension.     PHYSICAL EXAMINATION: ECOG PERFORMANCE STATUS: 1 - Symptomatic but completely ambulatory  Vitals:   02/13/21 0921  BP: 130/76  Pulse: 66  Resp: 18  Temp: 97.9 F (36.6 C)  SpO2: 100%   Filed Weights   02/13/21 0921  Weight: 179 lb (81.2 kg)    GENERAL:alert, no distress and comfortable NEURO: alert & oriented x 3 with  fluent speech, no focal motor/sensory deficits  LABORATORY DATA:  I have reviewed the data as listed    Component Value Date/Time   NA 142 01/24/2021 0822   NA 142 02/06/2017 0742   K 4.3 01/24/2021 0822   K 3.9 02/06/2017 0742   CL 108 01/24/2021 0822   CO2 25 01/24/2021 0822   CO2 22 02/06/2017 0742   GLUCOSE 93 01/24/2021 0822   GLUCOSE 156 (H) 02/06/2017 0742   BUN 17 01/24/2021 0822   BUN 13.5 02/06/2017 0742   CREATININE 0.91 01/24/2021 0822   CREATININE 0.85 10/26/2019 0722   CREATININE 0.8 02/06/2017 0742   CALCIUM 8.6 (L) 01/24/2021 0822   CALCIUM 8.5 02/06/2017 0742   PROT 7.5 01/24/2021 0822   PROT 6.9 02/06/2017 0742   ALBUMIN 3.8 01/24/2021 0822   ALBUMIN 3.7 02/06/2017 0742   AST 22 01/24/2021 0822   AST 24 10/26/2019 0722   AST 47 (H) 02/06/2017 0742   ALT 22 01/24/2021 0822   ALT 17 10/26/2019 0722   ALT 54 02/06/2017 0742   ALKPHOS 97 01/24/2021 0822   ALKPHOS 130 02/06/2017 0742   BILITOT 0.7 01/24/2021 0822   BILITOT 0.7 10/26/2019 0722   BILITOT 0.65 02/06/2017 0742   GFRNONAA >60 01/24/2021 0822   GFRNONAA >60 10/26/2019 0722   GFRAA >60 11/09/2019 0953   GFRAA >60 10/26/2019 0722    No results found for: SPEP, UPEP  Lab Results  Component Value Date   WBC 5.1 01/24/2021   NEUTROABS 3.7 01/24/2021   HGB 13.0 01/24/2021   HCT 38.2 01/24/2021   MCV 93.2 01/24/2021   PLT 90 (L) 01/24/2021      Chemistry      Component Value Date/Time   NA 142 01/24/2021 0822   NA 142 02/06/2017 0742   K 4.3 01/24/2021 0822   K 3.9 02/06/2017 0742   CL 108 01/24/2021 0822   CO2 25 01/24/2021 0822   CO2 22 02/06/2017 0742   BUN 17 01/24/2021 0822   BUN 13.5 02/06/2017 0742   CREATININE 0.91 01/24/2021 0822   CREATININE 0.85 10/26/2019 0722   CREATININE 0.8 02/06/2017 0742      Component Value Date/Time   CALCIUM 8.6 (L) 01/24/2021 0822   CALCIUM 8.5  02/06/2017 0742   ALKPHOS 97 01/24/2021 0822   ALKPHOS 130 02/06/2017 0742   AST 22 01/24/2021  0822   AST 24 10/26/2019 0722   AST 47 (H) 02/06/2017 0742   ALT 22 01/24/2021 0822   ALT 17 10/26/2019 0722   ALT 54 02/06/2017 0742   BILITOT 0.7 01/24/2021 0822   BILITOT 0.7 10/26/2019 0722   BILITOT 0.65 02/06/2017 0742       RADIOGRAPHIC STUDIES: I have reviewed multiple imaging studies with the patient and her husband I have personally reviewed the radiological images as listed and agreed with the findings in the report. CT ABDOMEN PELVIS W CONTRAST  Result Date: 02/09/2021 CLINICAL DATA:  History of ovarian cancer, recurrence. EXAM: CT ABDOMEN AND PELVIS WITH CONTRAST TECHNIQUE: Multidetector CT imaging of the abdomen and pelvis was performed using the standard protocol following bolus administration of intravenous contrast. CONTRAST:  48m OMNIPAQUE IOHEXOL 350 MG/ML SOLN COMPARISON:  CT abdomen and pelvis 12/12/2020 FINDINGS: Lower chest: Linear bands of scarring in the bilateral lung bases. Hepatobiliary: Diffusely nodular contour of the liver consistent with cirrhosis. No suspicious hepatic mass identified. Gallbladder appears normal. No biliary ductal dilatation identified. Pancreas: Unremarkable. No pancreatic ductal dilatation or surrounding inflammatory changes. Spleen: Enlarged measuring 15.1 cm. Adrenals/Urinary Tract: Symmetric perfusion of the kidneys. Subcentimeter hypodense likely cyst in the mid anterior left kidney. No hydronephrosis. Left adnexal soft tissue mass abuts the posterior left urinary bladder wall, invasion not excluded. Stomach/Bowel: No bowel obstruction, free air or pneumatosis. No bowel wall edema identified. Moderate to large amount of retained fecal material throughout the colon. Left adnexal soft tissue mass abuts the sigmoid colon, invasion not excluded. Vascular/Lymphatic: No bulky lymphadenopathy identified. Stable small retroperitoneal lymph nodes measuring up to 6 mm in short axis in the left lower abdomen common iliac region. Mild atherosclerotic  disease. Reproductive: Status post hysterectomy. The solid left adnexal mass measures 2.9 x 2.3 x 2.5 cm in axial and craniocaudal dimensions, mildly enlarged since previous study. Other: Stable subtle thickening/nodularity along the left pericolic gutter measuring approximately 5 mm in thickness. No ascites. Musculoskeletal: Stable appearing chronic and degenerative changes of the lumbar spine. No suspicious bony lesions identified. IMPRESSION: 1. Mild interval increased size of the solid left adnexal mass which may represent tumor recurrence/deposit. The mass abuts the adjacent sigmoid colon as well as the left posterior wall of the urinary bladder, invasion not excluded. 2. Otherwise no new mass or lymphadenopathy identified in the abdomen or pelvis. 3. Hepatic cirrhosis. Splenomegaly consistent with portal hypertension. Electronically Signed   By: DOfilia NeasM.D.   On: 02/09/2021 10:35

## 2021-02-13 NOTE — Assessment & Plan Note (Signed)
I have reviewed her blood work and imaging studies with the patient and her husband She has very slight gradual slow disease progression with gradual increase in CA125 value and detectable peritoneal disease on imaging study However, the patient is completely asymptomatic The pace of disease progression is very slow We have extensive discussions about the risk and benefits of delaying initiation of treatment versus treating her now Due to lack of symptoms, the patient favor close surveillance observation with serial imaging studies I plan to repeat imaging study again in 2 months for further follow-up In the meantime, she is educated to watch out for signs and symptoms of disease progression

## 2021-02-13 NOTE — Assessment & Plan Note (Signed)
We had extensive discussions in the past about the role of palliative chemotherapy Overall, she is comfortable with close observation and surveillance without pursuing treatment right now

## 2021-02-13 NOTE — Assessment & Plan Note (Signed)
She has chronic back pain  I refilled her prescription of pain medicine recently

## 2021-02-13 NOTE — Assessment & Plan Note (Signed)
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 It is stable She has no recent bleeding Observe closely for now

## 2021-03-30 ENCOUNTER — Telehealth: Payer: Self-pay

## 2021-03-30 ENCOUNTER — Other Ambulatory Visit: Payer: Self-pay | Admitting: Hematology and Oncology

## 2021-03-30 MED ORDER — HYDROCODONE-ACETAMINOPHEN 5-325 MG PO TABS: 1.0000 | ORAL_TABLET | Freq: Four times a day (QID) | ORAL | 0 refills | Status: DC | PRN

## 2021-03-30 NOTE — Telephone Encounter (Signed)
Called and told Rx sent. She verbalized understanding.

## 2021-03-30 NOTE — Telephone Encounter (Signed)
done

## 2021-03-30 NOTE — Telephone Encounter (Signed)
She called and requested a Vicodin refill.

## 2021-04-09 ENCOUNTER — Other Ambulatory Visit: Payer: Self-pay

## 2021-04-09 DIAGNOSIS — D696 Thrombocytopenia, unspecified: Secondary | ICD-10-CM

## 2021-04-09 DIAGNOSIS — C482 Malignant neoplasm of peritoneum, unspecified: Secondary | ICD-10-CM

## 2021-04-10 ENCOUNTER — Inpatient Hospital Stay: Payer: Medicare Other | Attending: Hematology and Oncology

## 2021-04-10 ENCOUNTER — Encounter (HOSPITAL_COMMUNITY): Payer: Self-pay

## 2021-04-10 ENCOUNTER — Other Ambulatory Visit: Payer: Self-pay

## 2021-04-10 ENCOUNTER — Ambulatory Visit (HOSPITAL_COMMUNITY)
Admission: RE | Admit: 2021-04-10 | Discharge: 2021-04-10 | Disposition: A | Discharge status: Home / Self Care | Payer: Medicare Other | Source: Ambulatory Visit | Attending: Hematology and Oncology | Admitting: Hematology and Oncology

## 2021-04-10 DIAGNOSIS — D61818 Other pancytopenia: Secondary | ICD-10-CM | POA: Diagnosis not present

## 2021-04-10 DIAGNOSIS — Z8543 Personal history of malignant neoplasm of ovary: Secondary | ICD-10-CM | POA: Diagnosis not present

## 2021-04-10 DIAGNOSIS — D696 Thrombocytopenia, unspecified: Secondary | ICD-10-CM

## 2021-04-10 DIAGNOSIS — Z5111 Encounter for antineoplastic chemotherapy: Secondary | ICD-10-CM

## 2021-04-10 DIAGNOSIS — D509 Iron deficiency anemia, unspecified: Secondary | ICD-10-CM

## 2021-04-10 DIAGNOSIS — C482 Malignant neoplasm of peritoneum, unspecified: Secondary | ICD-10-CM | POA: Diagnosis present

## 2021-04-10 LAB — COMPREHENSIVE METABOLIC PANEL
ALT: 20 U/L (ref 0–44)
AST: 20 U/L (ref 15–41)
Albumin: 3.9 g/dL (ref 3.5–5.0)
Alkaline Phosphatase: 86 U/L (ref 38–126)
Anion gap: 6 (ref 5–15)
BUN: 17 mg/dL (ref 8–23)
CO2: 28 mmol/L (ref 22–32)
Calcium: 8.8 mg/dL — ABNORMAL LOW (ref 8.9–10.3)
Chloride: 107 mmol/L (ref 98–111)
Creatinine, Ser: 0.9 mg/dL (ref 0.44–1.00)
GFR, Estimated: >60 mL/min (ref >60)
Glucose, Bld: 99 mg/dL (ref 70–99)
Potassium: 4 mmol/L (ref 3.5–5.1)
Sodium: 141 mmol/L (ref 135–145)
Total Bilirubin: 0.5 mg/dL (ref 0.3–1.2)
Total Protein: 7.5 g/dL (ref 6.5–8.1)

## 2021-04-10 LAB — CBC WITH DIFFERENTIAL/PLATELET
Abs Immature Granulocytes: 0.01 10*3/uL (ref 0.00–0.07)
Basophils Absolute: 0 10*3/uL (ref 0.0–0.1)
Basophils Relative: 1 %
Eosinophils Absolute: 0.1 10*3/uL (ref 0.0–0.5)
Eosinophils Relative: 2 %
HCT: 38 % (ref 36.0–46.0)
Hemoglobin: 12.6 g/dL (ref 12.0–15.0)
Immature Granulocytes: 0 %
Lymphocytes Relative: 26 %
Lymphs Abs: 1 10*3/uL (ref 0.7–4.0)
MCH: 30.7 pg (ref 26.0–34.0)
MCHC: 33.2 g/dL (ref 30.0–36.0)
MCV: 92.7 fL (ref 80.0–100.0)
Monocytes Absolute: 0.2 10*3/uL (ref 0.1–1.0)
Monocytes Relative: 5 %
Neutro Abs: 2.5 10*3/uL (ref 1.7–7.7)
Neutrophils Relative %: 66 %
Platelets: 88 10*3/uL — ABNORMAL LOW (ref 150–400)
RBC: 4.1 MIL/uL (ref 3.87–5.11)
RDW: 13.7 % (ref 11.5–15.5)
WBC: 3.8 10*3/uL — ABNORMAL LOW (ref 4.0–10.5)
nRBC: 0 % (ref 0.0–0.2)

## 2021-04-10 LAB — FERRITIN: Ferritin: 127 ng/mL (ref 11–307)

## 2021-04-10 LAB — IRON AND IRON BINDING CAPACITY (CC-WL,HP ONLY)
Iron: 64 ug/dL (ref 28–170)
Saturation Ratios: 20 % (ref 10.4–31.8)
TIBC: 326 ug/dL (ref 250–450)
UIBC: 262 ug/dL (ref 148–442)

## 2021-04-10 MED ORDER — HEPARIN SOD (PORK) LOCK FLUSH 100 UNIT/ML IV SOLN: 500.0000 [IU] | Freq: Once | INTRAVENOUS | Status: AC

## 2021-04-10 MED ORDER — IOHEXOL 300 MG/ML  SOLN
100.0000 mL | Freq: Once | INTRAMUSCULAR | Status: AC | PRN
  Administered 2021-04-10: 100 mL via INTRAVENOUS

## 2021-04-10 MED ORDER — HEPARIN SOD (PORK) LOCK FLUSH 100 UNIT/ML IV SOLN
INTRAVENOUS | Status: AC
  Administered 2021-04-10: 500 [IU] via INTRAVENOUS
  Filled 2021-04-10: qty 5

## 2021-04-10 MED ORDER — SODIUM CHLORIDE (PF) 0.9 % IJ SOLN
INTRAMUSCULAR | Status: AC
  Filled 2021-04-10: qty 50

## 2021-04-10 MED ORDER — IOHEXOL 9 MG/ML PO SOLN
ORAL | Status: AC
  Administered 2021-04-10: 1000 mL
  Filled 2021-04-10: qty 1000

## 2021-04-10 MED ORDER — IOHEXOL 9 MG/ML PO SOLN: 500.0000 mL | ORAL | Status: AC

## 2021-04-10 MED ORDER — SODIUM CHLORIDE 0.9% FLUSH
10.0000 mL | Freq: Once | INTRAVENOUS | Status: AC
  Administered 2021-04-10: 10 mL

## 2021-04-11 LAB — CA 125: Cancer Antigen (CA) 125: 79.4 U/mL — ABNORMAL HIGH (ref 0.0–38.1)

## 2021-04-12 ENCOUNTER — Other Ambulatory Visit: Payer: Self-pay

## 2021-04-12 ENCOUNTER — Encounter: Payer: Self-pay | Admitting: Hematology and Oncology

## 2021-04-12 ENCOUNTER — Inpatient Hospital Stay: Payer: Medicare Other | Attending: Hematology and Oncology | Admitting: Hematology and Oncology

## 2021-04-12 VITALS — BP 140/70 | HR 71 | Temp 97.7°F | Resp 18 | Ht 61.0 in | Wt 179.0 lb

## 2021-04-12 DIAGNOSIS — E1142 Type 2 diabetes mellitus with diabetic polyneuropathy: Secondary | ICD-10-CM | POA: Insufficient documentation

## 2021-04-12 DIAGNOSIS — Z794 Long term (current) use of insulin: Secondary | ICD-10-CM

## 2021-04-12 DIAGNOSIS — D61818 Other pancytopenia: Secondary | ICD-10-CM | POA: Insufficient documentation

## 2021-04-12 DIAGNOSIS — E1140 Type 2 diabetes mellitus with diabetic neuropathy, unspecified: Secondary | ICD-10-CM

## 2021-04-12 DIAGNOSIS — Z79899 Other long term (current) drug therapy: Secondary | ICD-10-CM | POA: Insufficient documentation

## 2021-04-12 DIAGNOSIS — C482 Malignant neoplasm of peritoneum, unspecified: Secondary | ICD-10-CM | POA: Diagnosis not present

## 2021-04-12 DIAGNOSIS — Z5111 Encounter for antineoplastic chemotherapy: Secondary | ICD-10-CM | POA: Insufficient documentation

## 2021-04-12 DIAGNOSIS — K5909 Other constipation: Secondary | ICD-10-CM

## 2021-04-12 DIAGNOSIS — K5900 Constipation, unspecified: Secondary | ICD-10-CM | POA: Insufficient documentation

## 2021-04-12 DIAGNOSIS — Z7189 Other specified counseling: Secondary | ICD-10-CM

## 2021-04-12 MED ORDER — DEXAMETHASONE 4 MG PO TABS: ORAL_TABLET | ORAL | 0 refills | Status: DC

## 2021-04-12 NOTE — Assessment & Plan Note (Signed)
She is made aware of the risks of severe hyperglycemia and worsening neuropathy with treatment ?She would like to proceed ?

## 2021-04-12 NOTE — Assessment & Plan Note (Signed)
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

## 2021-04-12 NOTE — Progress Notes (Signed)
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[facility]) I have reviewed her blood work and imaging study with the patient and her husband She has signs of disease progression and is somewhat symptomatic with increasing symptoms of constipation The patient desire treatment Previously, she tolerated reduced dose Taxotere well with G-CSF support We discontinued the treatment due to excessive side effects but majority of the side effects has resolved She does have persistent mild pancytopenia related to her liver disease and splenomegaly and chronic neuropathy from diabetes After much discussion, she is in agreement to proceed with chemotherapy with single agent Taxotere I plan to get her started on March 14 I will see her prior to cycle 2 for toxicity review The risks, benefits, side effects of treatment were fully discussed with the patient and her husband and they are in agreement to proceed  Diabetes mellitus with diabetic neuropathy, with long-term current use of insulin (HCC) She is made aware of the risks of severe hyperglycemia and worsening neuropathy with treatment She would like to proceed  Pancytopenia, acquired [facility]) 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 She is not symptomatic We will proceed with treatment as long as her platelet count is greater than 50,000 and total white blood cell count is greater than 1  Other constipation I am concerned that her slight worsening constipation could be related to her peritoneal disease We discussed the importance of aggressive laxatives  Orders Placed This Encounter  Procedures   CBC with Differential (Cancer Center Only)    Standing Status:   Standing    Number of Occurrences:   20    Standing Expiration Date:   04/13/2022    CMP (Cancer Center only)    Standing Status:   Standing    Number of Occurrences:   20    Standing Expiration Date:   04/13/2022    All questions were answered. The patient knows to call the clinic with any problems, questions or concerns. The total time spent in the appointment was 55 minutes encounter with patients including review of chart and various tests results, discussions about plan of care and coordination of care plan   Artis Delay, MD 04/12/2021 9:07 AM  INTERVAL HISTORY: Please see below for problem oriented charting. she returns for treatment follow-up with her husband She had slight worsening constipation recently Otherwise denies abdominal pain or nausea  REVIEW OF SYSTEMS:   Constitutional: Denies fevers, chills or abnormal weight loss Eyes: Denies blurriness of vision Ears, nose, mouth, throat, and face: Denies mucositis or sore throat Respiratory: Denies cough, dyspnea or wheezes Cardiovascular: Denies palpitation, chest discomfort or lower extremity swelling Skin: Denies abnormal skin rashes Lymphatics: Denies new lymphadenopathy or easy bruising Behavioral/Psych: Mood is stable, no new changes  All other systems were reviewed with the patient and are negative.  I have reviewed the past medical history, past surgical history, social history and family history with the patient and they are unchanged from previous note.  ALLERGIES:  is allergic to carboplatin, shellfish allergy, vancomycin, benadryl [diphenhydramine hcl], penicillins, and tape.  MEDICATIONS:  Current Outpatient Medications  Medication Sig Dispense Refill   calcium elemental as carbonate (BARIATRIC TUMS ULTRA) 400 MG chewable tablet Chew 1 tablet by mouth 2 (two) times daily.     Cholecalciferol (VITAMIN D) 50 MCG (2000  UT) tablet Take 2,000 Units by mouth at bedtime.     dexamethasone (DECADRON) 4 MG tablet Take 1 tablet the day before chemo and daily after chemo for 2 days. Please dispense 18  tabs for all 6 cycles of treatment 18 tablet 0   gabapentin (NEURONTIN) 300 MG capsule TAKE 3 CAPSULES(900 MG) BY MOUTH TWICE DAILY (Patient taking differently: 2 (two) times daily.) 120 capsule 11   HYDROcodone-acetaminophen (NORCO/VICODIN) 5-325 MG tablet Take 1 tablet by mouth every 6 (six) hours as needed for moderate pain. 60 tablet 0   Insulin Degludec 100 UNIT/ML SOLN Inject 30 Units into the skin daily.     lidocaine-prilocaine (EMLA) cream Apply to Porta-cath  1-2 hours prior to access as directed. (Patient taking differently: Apply 1 application topically daily as needed (port access).) 30 g 9   ondansetron (ZOFRAN) 8 MG tablet TAKE 1 TABLET(8 MG) BY MOUTH EVERY 8 HOURS AS NEEDED 60 tablet 1   pantoprazole (PROTONIX) 40 MG tablet Take 1 tablet (40 mg total) by mouth daily.     prochlorperazine (COMPAZINE) 10 MG tablet Take 1 tablet (10 mg total) by mouth every 6 (six) hours as needed (Nausea or vomiting). 30 tablet 1   rotigotine (NEUPRO) 4 MG/24HR APPLY 1 PATCH TOPICALLY TO THE SKIN DAILY 90 patch 3   SEMAGLUTIDE,0.25 OR 0.5MG /DOS, [place] Inject 0.25 mg into the skin once a week.     No current facility-administered medications for this visit.    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 Had allergic reaction to carboplatin. Avastin was stopped due to GI hemorrhage   Primary peritoneal carcinomatosis (HCC)  09/06/2011 Imaging   CT findings consistent with cirrhotic changes involving the liver.  No worrisome liver mass.  There are associated portal venous collaterals and splenomegaly consistent with portal venous hypertension. 2.  No other significant upper abdominal findings   11/08/2013 Imaging   US showed ascites   11/08/2013 Initial Diagnosis   Patient had ~ 2 months of some urinary incontinence, saw Dr Billy Coast in 10-2013, with pelvic mass on exam    11/12/2013 Imaging   There are  findings of progressive hepatic cirrhosis with a large amount of ascites and mild splenomegaly. There are venous collaterals present. 2. There is abnormal thickening of the peritoneal surface along the left mid and lower abdominal wall. There is abnormal soft tissue density in the pelvis as well which could reflect the clinically suspected ovarian malignancy but the findings are nondiagnostic. 3. There is a new small left pleural effusion. 4. There is no acute bowel abnormality.   11/18/2013 Imaging   Successful ultrasound guided diagnostic and therapeutic paracentesis yielding 4.1 liters of ascites.     11/18/2013 Pathology Results   PERITONEAL/ASCITIC FLUID (SPECIMEN 1 OF 1 COLLECTED 11/18/13): SEROUS CARCINOMA, PLEASE SEE COMMENT.   12/01/2013 Tumor Marker   Patient's tumor was tested for the following markers: CA125 Results of the tumor marker test revealed 1938   12/03/2013 Imaging   Successful ultrasound-guided therapeutic paracentesis yielding 3.6 liters of peritoneal fluid.   12/07/2013 - 05/30/2014 Chemotherapy   She received 3 cycles of carboplatin and Taxol, interrupted cycle 4 for surgery.  She subsequently completed 3 more cycles of chemotherapy after surgery   12/20/2013 Imaging   Successful ultrasound-guided diagnostic and therapeutic paracentesis yielding 2.2 liters of peritoneal fluid   12/20/2013 Pathology Results   PERITONEAL/ASCITIC FLUID(SPECIMEN 1 OF 1 COLLECTED 12/20/13): MALIGNANT CELLS  CONSISTENT WITH SEROUS CARCINOMA   01/13/2014 Tumor Marker   Patient's tumor was tested for the following markers: CA125 Results of the tumor marker test revealed 227   02/07/2014 Tumor Marker   Patient's tumor was tested for the following markers: CA125 Results of the tumor marker test revealed 130   02/15/2014 Genetic Testing   Genetics testing from 02-2014 normal ([employer] panel)   02/23/2014 Imaging   Interval decrease in ascites. 2. Morphologic changes in the liver consistent  with cirrhosis. 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 Portal vein remains patent at this time. 3. Persistent but decreased abnormal soft tissue attenuation tracking in the omental fat. This may be secondary to interval improvement in metastatic disease. 4. Peritoneal thickening seen in the left abdomen and pelvis on the previous study has decreased and nearly resolved in the interval. This also suggests interval improvement in metastatic disease.     03/07/2014 Procedure   Ultrasound and fluoroscopically guided right internal jugular single lumen power port catheter insertion. Tip in the SVC/RA junction. Catheter ready for use.   03/17/2014 Tumor Marker   Patient's tumor was tested for the following markers: CA125 Results of the tumor marker test revealed 45   03/29/2014 Pathology Results   1. Omentum, resection for tumor - HIGH GRADE SEROUS CARCINOMA, SEE COMMENT. 2. Ovary and fallopian tube, right - HIGH GRADE SEROUS CARCINOMA, SEE COMMENT. 3. Ovary and fallopian tube, left - HIGH GRADE SEROUS CARCINOMA, SEE COMMENT. Diagnosis Note 1. Nests and clusters of malignant cells are invading the omental tissue (part #1) with associated fibrosis. The cells are pleomorphic with prominent nucleoli. There are scattered psammoma bodies. The ovaries are atrophic and exhibit multiple foci of surface based invasive carcinoma and associated fibrosis. The fallopian tubes have a few foci of carcinoma, also superficially located. There are no precursor lesions noted in the ovaries or fallopian tubes (the entire tubes and ovaries were submitted for evaluation). While there is some retraction artifact, there are several foci suspicious for lymphovascular invasion. 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 Given the fibrosis, there does appear to  be a small amount of treatment effect, however, there is abundant residual tumor.    03/29/2014 Surgery   Procedure(s) Performed: 1. Exploratory laparotomy with bilateral salpingo-oophorectomy, omentectomy radical tumor debulking for ovarian cancer .   Surgeon: Luisa Dago, MD.   Assistant Surgeon: Antionette Char, M.D. Assistant: (an MD assistant was necessary for tissue manipulation, retraction and positioning due to the complexity of the case and hospital policies).  Operative Findings: 10cm omental cake from hepatic to splenic flexure, densely adherent to transverse colon. Milliary studding of tumor implants (<21mm, too numerous in number to count) adherent to the mesentery of the small bowel and small bowel wall. Small volume (100cc) ascites. Small ovaries bilaterally, densely adherent to the pelvic cul de sac. Left ovary and tube densely adherent to sigmoid colon. Cirrhotic liver with hepatomegaly and splenomegaly.     This represented an optimal cytoreduction (R1) with visible disease residual on the bowel wall and mesentery (millial, <67mm implants).      04/18/2014 Tumor Marker   Patient's tumor was tested for the following markers: CA125 Results of the tumor marker test revealed 122   05/16/2014 Tumor Marker   Patient's tumor was tested for the following markers: CA125 Results of the tumor marker test revealed 30  06/10/2014 Tumor Marker   Patient's tumor was tested for the following markers: CA125 Results of the tumor marker test revealed 19   07/04/2014 Imaging   Interval improvement in the appearance of peritoneal metastasis secondary to ovarian cancer. 2. 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     08/18/2014 Tumor Marker   Patient's tumor was tested for the following markers: CA125 Results of the tumor marker test revealed 16   10/13/2014 Tumor Marker   Patient's tumor was tested for the following markers: CA125 Results of the tumor marker test revealed  14   11/21/2014 Tumor Marker   Patient's tumor was tested for the following markers: CA125 Results of the tumor marker test revealed 16   12/28/2014 Tumor Marker   Patient's tumor was tested for the following markers: CA125 Results of the tumor marker test revealed 762   02/27/2015 Tumor Marker   Patient's tumor was tested for the following markers: CA125 Results of the tumor marker test revealed 20.2   03/22/2015 Imaging   Filler appearance to the prior exam, with very subtle fluid tracking along portions of the liver, and very minimal nodularity along the right paracolic gutter representing residua from the prior peritoneal cancer. The current abnormalities could simply be post therapy findings rather than necessarily representing residual malignancy. No new or enlarging lesions are identified. 2. Hepatic cirrhosis and splenomegaly. There is some gastric varices suggesting portal venous hypertension. 3. Left foraminal stenosis at L4-5 due to spurring. There is likely also some central narrowing of the thecal sac at this level. 4. Bibasilar scarring. 5. Chronic mild left mid kidney scarring   03/27/2015 Tumor Marker   Patient's tumor was tested for the following markers: CA125 Results of the tumor marker test revealed 25.3   04/24/2015 Imaging   Disc bulge L2-3 with mild spinal stenosis and right foraminal encroachment. 2. Disc bulge L3-4 with mild spinal stenosis. 3. Multifactorial spinal, left lateral recess and foraminal stenosis L4-5. There is grade 1 anterolisthesis without evident dynamic instability.   05/29/2015 Tumor Marker   Patient's tumor was tested for the following markers: CA125 Results of the tumor marker test revealed 29.9   08/21/2015 Tumor Marker   Patient's tumor was tested for the following markers: CA125 Results of the tumor marker test revealed 45   09/18/2015 Tumor Marker   Patient's tumor was tested for the following markers: CA125 Results of the tumor marker test  revealed 47   09/27/2015 Imaging   New small bowel mesenteric nodule and enlarged left external iliac lymph node, highly worrisome for metastatic disease. 2. Cirrhosis and splenomegaly   10/10/2015 Tumor Marker   Patient's tumor was tested for the following markers: CA125 Results of the tumor marker test revealed 53.4   10/10/2015 - 12/11/2015 Chemotherapy   She received 4 cycles of carboplatin only   11/20/2015 Tumor Marker   Patient's tumor was tested for the following markers: CA125 Results of the tumor marker test revealed 41.3   12/20/2015 Imaging   CT: Mild left external iliac lymphadenopathy is slightly decreased. Mildly enlarged lower mesenteric nodule is slightly decreased. 2. No new or progressive metastatic disease in the abdomen or pelvis. 3. Cirrhosis. Stable subcentimeter hypodense left liver lobe lesion. No new liver lesions. 4. Stable mild splenomegaly.  No ascites. 5. Mild sigmoid diverticulosis.   01/01/2016 -  Anti-estrogen oral therapy   She was placed on tamoxifen. Had letrozole initially but switched to tamoxifen due to poor tolerance   01/15/2016 Tumor  Marker   Patient's tumor was tested for the following markers: CA125 Results of the tumor marker test revealed 31.4   02/19/2016 Tumor Marker   Patient's tumor was tested for the following markers: CA125 Results of the tumor marker test revealed 36.5   05/13/2016 Imaging   No evidence of disease progression within the abdomen or pelvis. Previously noted small central mesenteric nodule and left external iliac node appear slightly smaller. 2. Stable changes of hepatic cirrhosis and portal hypertension with associated splenomegaly. No new or enlarging hepatic lesions are identified. 3. No acute findings.   05/13/2016 Tumor Marker   Patient's tumor was tested for the following markers: CA125 Results of the tumor marker test revealed 41.2   06/24/2016 Tumor Marker   Patient's tumor was tested for the following markers:  CA125 Results of the tumor marker test revealed 47.3   08/20/2016 Imaging   1. The left external iliac lymph node is slightly increased in size compared to the prior exam, currently 1.1 cm and previously 0.9 cm in diameter. 2. Stable central mesenteric lymph node at 0.9 cm in diameter. 3. Stable trace free pelvic fluid and stable slight thickening along the right paracolic gutter, without well-defined peritoneal nodularity. 4. Other imaging findings of potential clinical significance: Subsegmental atelectasis or scarring in the lung bases. Hepatic cirrhosis. Left renal scarring. Mild splenomegaly. Sigmoid colon diverticulosis. Impingement at L4-5 due to spondylosis and degenerative disc disease broad Schmorl' s nodes at L3-L4. Pelvic floor laxity   08/23/2016 Tumor Marker   Patient's tumor was tested for the following markers: CA125 Results of the tumor marker test revealed 80.2   09/04/2016 Imaging   LV EF: 60% -   65%   09/12/2016 Tumor Marker   Patient's tumor was tested for the following markers: CA125 Results of the tumor marker test revealed 98.5   09/12/2016 - 01/29/2018 Chemotherapy   The patient received Doxil and Avastin. Avastin was discontinued in Dec 2019 due to GI hemorrhage   09/26/2016 Tumor Marker   Patient's tumor was tested for the following markers: CA125 Results of the tumor marker test revealed 68.9   10/10/2016 Tumor Marker   Patient's tumor was tested for the following markers: CA125 Results of the tumor marker test revealed 65.6   11/07/2016 Tumor Marker   Patient's tumor was tested for the following markers: CA125 Results of the tumor marker test revealed 46.4   11/29/2016 Imaging   Stable mild peritoneal thickening, suspicious for peritoneal carcinomatosis. No new or progressive disease identified. No evidence of ascites.  No significant change in 10 mm left external iliac and 8 mm mesenteric lymph nodes.  Stable hepatic cirrhosis, and splenomegaly consistent  with portal venous hypertension.  Colonic diverticulosis, without radiographic evidence of diverticulitis.   12/02/2016 Imaging   Normal LV size with EF 55%. Basal inferior and basal inferoseptal hypokinesis. Normal RV size and systolic function. No significant valvular abnormalities.   12/05/2016 Tumor Marker   Patient's tumor was tested for the following markers: CA125 Results of the tumor marker test revealed 49.1   01/09/2017 Tumor Marker   Patient's tumor was tested for the following markers: CA125 Results of the tumor marker test revealed 47.2   02/06/2017 Tumor Marker   Patient's tumor was tested for the following markers: CA125 Results of the tumor marker test revealed 44.1   02/18/2017 Imaging   Stable mild peritoneal thickening. No new or progressive disease identified within the abdomen or pelvis.  Stable tiny sub-cm left external iliac and  mesenteric lymph nodes.  Stable hepatic cirrhosis, and splenomegaly consistent with portal venous hypertension. No evidence of hepatic neoplasm.  Colonic diverticulosis, without radiographic evidence of diverticulitis.   03/06/2017 Tumor Marker   Patient's tumor was tested for the following markers: CA125 Results of the tumor marker test revealed 50.4   03/17/2017 Imaging   - Left ventricle: The cavity size was normal. Wall thickness was normal. Systolic function was normal. The estimated ejection fraction was in the range of 55% to 60%. Wall motion was normal; there were no regional wall motion abnormalities. Features are consistent with a pseudonormal left ventricular filling pattern, with concomitant abnormal relaxation and increased filling pressure (grade 2 diastolic dysfunction). - Mitral valve: There was mild regurgitation. - Left atrium: The atrium was mildly dilated   04/03/2017 Tumor Marker   Patient's tumor was tested for the following markers: CA125 Results of the tumor marker test revealed 47.2   05/14/2017 Imaging   CT  scan of abdomen and pelvis Stable mild peritoneal thickening. No new or progressive disease within the abdomen or pelvis.  Stable hepatic cirrhosis. Stable splenomegaly, consistent with portal venous hypertension. No evidence of hepatic neoplasm.   05/15/2017 Tumor Marker   Patient's tumor was tested for the following markers: CA125 Results of the tumor marker test revealed 56.1   06/12/2017 Tumor Marker   Patient's tumor was tested for the following markers: CA125 Results of the tumor marker test revealed 49.2   07/10/2017 Tumor Marker   Patient's tumor was tested for the following markers: CA125 Results of the tumor marker test revealed 46.3   08/07/2017 Tumor Marker   Patient's tumor was tested for the following markers: CA125 Results of the tumor marker test revealed 52.9   08/20/2017 Imaging   1. Mild omental/peritoneal haziness, unchanged. No evidence of new metastatic disease. 2. Cirrhosis with splenomegaly.   09/04/2017 Tumor Marker   Patient's tumor was tested for the following markers: CA125 Results of the tumor marker test revealed 48.5   11/13/2017 Tumor Marker   Patient's tumor was tested for the following markers: CA125 Results of the tumor marker test revealed 55.8   12/17/2017 Imaging   1. No findings to suggest metastatic disease in the abdomen or pelvis. 2. Severe hepatic cirrhosis with evidence of portal hypertension, as demonstrated by dilated portal vein, splenomegaly and portosystemic collateral pathways, including gastric and esophageal varices.  3. Colonic diverticulosis without evidence of acute diverticulitis at this time. 4. Additional incidental findings, as above.   01/16/2018 Tumor Marker   Patient's tumor was tested for the following markers: CA125 Results of the tumor marker test revealed 63.7   02/10/2018 Procedure   EGD - Recently bleeding grade III and large (> 5 mm) esophageal varices. Completely eradicated. Banded. - Portal hypertensive  gastropathy. - Normal examined duodenum. - No specimens collected   02/13/2018 Tumor Marker   Patient's tumor was tested for the following markers: CA125 Results of the tumor marker test revealed 82.7   02/25/2018 Imaging   Bone density showed mild osteopenia   04/08/2018 Tumor Marker   Patient's tumor was tested for the following markers: CA125 Results of the tumor marker test revealed 82.7   04/08/2018 Imaging   1. No definite omental or peritoneal surface lesions. However, there are 2 slowly enlarging lymph nodes noted. One is in the small bowel mesentery and the other is in the left deep pelvis. Could not exclude recurrent disease. 2. Stable advanced cirrhotic changes involving the liver with portal venous hypertension,  portal venous collaterals, esophageal varices and splenomegaly. No worrisome hepatic lesions. 3. Diffuse wall thickening of the colon could suggest diffuse colitis or could be due to low albumin. Recommend correlation with any symptoms such as diarrhea.   05/21/2018 Tumor Marker   Patient's tumor was tested for the following markers: CA125 Results of the tumor marker test revealed 86   09/07/2018 Tumor Marker   Patient's tumor was tested for the following markers:CA-125 Results of the tumor marker test revealed 111   09/29/2018 Tumor Marker   Patient's tumor was tested for the following markers: CA-125 Results of the tumor marker test revealed 121.   09/29/2018 - 11/24/2018 Chemotherapy   The patient had carboplatin and gemzar for chemotherapy treatment.  Chemo was stopped due to allergic reaction to carboplatin   10/27/2018 Tumor Marker   Patient's tumor was tested for the following markers: CA-125 Results of the tumor marker test revealed 99.9   11/24/2018 Tumor Marker   Patient's tumor was tested for the following markers: CA-125 Results of the tumor marker test revealed 81.2   12/14/2018 Tumor Marker   Patient's tumor was tested for the following markers:  CA-125 Results of the tumor marker test revealed 81.2   12/14/2018 Imaging   1. Mild improvement in peritoneal disease. Peritoneal nodule within the left posterior pelvis contiguous with the left side of vaginal cuff is slightly decreased in size in the interval. Within the right iliac fossa there is a small peritoneal nodule which has decreased in size in the interval. Central small bowel mesenteric lymph node is not significantly changed. Slight decrease in size left periaortic lymph node.    2. Morphologic features of the liver compatible with cirrhosis. Splenomegaly is noted which may reflect portal venous hypertension.   12/21/2018 Tumor Marker   Patient's tumor was tested for the following markers: CA-125 Results of the tumor marker test revealed 64.2   12/21/2018 -  Chemotherapy   The patient had taxotere for chemotherapy treatment.     03/02/2019 Tumor Marker   Patient's tumor was tested for the following markers: CA-125. 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   No new or progressive metastatic disease within the abdomen or pelvis.   Hepatic cirrhosis and findings of portal venous hypertension. 1.1 cm low-attenuation lesion within left hepatic lobe, which could represent a regenerative nodule or small hepatocellular carcinoma. Recommend abdomen MRI without and with contrast for further characterization.     04/13/2019 Tumor Marker   Patient's tumor was tested for the following markers: CA-125 Results of the tumor marker test revealed 36.8   05/26/2019 Tumor Marker   Patient's tumor was tested for the following markers: CA-125 Results of the tumor marker test revealed 31.6.   06/15/2019 Tumor Marker   Patient's tumor was tested for the following markers: CA-125 Results of the tumor marker test revealed 25.4   07/05/2019 Imaging   1. Stable CT examination. Stable peritoneal  thickening in the pelvis and paracolic gutters. Stable solid left deep pelvic implant. Stable trace ascites. No new or progressive metastatic disease. 2. Stable cirrhosis. No discrete liver masses. 3. Stable mild-to-moderate splenomegaly. 4. Aortic Atherosclerosis (ICD10-I70.0).   07/27/2019 Tumor Marker   Patient's tumor was tested for the following markers: CA-125 Results of the tumor marker test revealed 35.4   08/17/2019 Tumor Marker   Patient's tumor was tested for the following markers: Ca-125 Results of the tumor  marker test revealed 35.7.   09/17/2019 - 09/21/2019 Hospital Admission   She was admitted due to esophageal variceal bleeding presents with hematemesis and melanotic stool with epigastric pain.  GI consulted, started on octreotide infusion.  Underwent EGD which showed minimal bleeding, remain on octreotide drip, PPI drip.  Hemoglobin trended down therefore received PRBC transfusion.  Over several days her bleeding subsided, hemodynamically remained stable.    09/17/2019 Procedure   EGD report - Esophageal mucosal scarring, from prior esophageal banding sessions. - Grade I esophageal varices. - Red blood in the cardia and in the gastric fundus. - Portal hypertensive gastropathy. - Normal duodenal bulb, first portion of the duodenum and second portion of the duodenum. 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 2. Otherwise stable CT exam. Stable peritoneal thickening within the bilateral deep pelvic peritoneum. Stable left deep pelvic implant.No evidence for progressed disease. 3. Stable  splenomegaly. 4. Morphologic changes to the liver compatible with cirrhosis. Trace perihepatic and pelvic ascites.   02/22/2020 Imaging   1. New lucent lesion in the left side of the L4 vertebral body measuring 1 cm, concerning for possible osseous metastasis. Consideration for further evaluation with bone scan is suggested. Close attention on follow-up imaging is also recommended to ensure stability. 2. Previously noted peritoneal thickening and nodularity in the low anatomic pelvis has regressed slightly. 3. Borderline enlarged and mildly enlarged pelvic and retroperitoneal lymph nodes appear similar to slightly increased compared to the prior examination, as above. Continued attention on follow-up studies is recommended.  4. 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 5. Aortic atherosclerosis. 6. Additional incidental findings, as above.   02/22/2020 Tumor Marker   Patient's tumor was tested for the following markers: CA-125 Results of the tumor marker test revealed 30.1   04/06/2020 Tumor Marker   Patient's tumor was tested for the following markers: CA-125 Results of the tumor marker test revealed 35.7   09/11/2020 Tumor Marker   Patient's tumor was tested for the following markers: CA-125. Results of the tumor marker test revealed 26.6.   09/12/2020 Imaging   1. Slow enlargement of a currently 2.9 cubic cm soft tissue nodule along the left adnexa. Tumor deposit not excluded. 2. The left external iliac lymph node has substantially reduced in size, currently 0.9 cm in short axis and formerly 1.4 cm. 3. 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 4. Cirrhosis with portal venous hypertension. 5. Mild distal esophageal wall thickening, probably from esophagitis. Splenomegaly. 6. Scarring in the left kidney. 7.  Sigmoid colon diverticulosis. 8. Pelvic floor laxity with small cystocele. 9. Lumbar impingement at L2-3, L3-4, and L4-5.     10/23/2020 Tumor Marker   Patient's tumor was tested for the following markers: CA-125. Results of the tumor marker test revealed 35.3.   12/12/2020 Tumor Marker   Patient's tumor was tested for the following markers: CA-125. Results of the tumor marker test revealed 39.1.   12/13/2020 Imaging   1. Continued increase in size of soft tissue nodule within the left posterior pelvis adjacent to the sigmoid  colon. Cannot exclude small tumor deposit. 2. Similar appearance of thickening and mild nodularity along the peritoneal reflections of the pelvis and pericolic gutters. 3. Morphologic findings of the liver compatible with cirrhosis. 4. Splenomegaly. 5. Aortic Atherosclerosis (ICD10-I70.0).   01/24/2021 Tumor Marker   Patient's tumor was tested for the following markers: CA-125. Results of the tumor marker test revealed 56.2.   02/09/2021 Imaging   1. Mild interval increased size of the solid left adnexal mass which may represent tumor recurrence/deposit. The mass abuts the adjacent sigmoid colon as well as the left posterior wall of the urinary bladder, invasion not excluded. 2. Otherwise no new mass or lymphadenopathy identified in the abdomen or pelvis. 3. Hepatic cirrhosis. Splenomegaly consistent with portal hypertension.   04/11/2021 Imaging   1. Left posterior pelvic soft tissue mass, adjacent to the sigmoid colon, has slightly increased in size in comparison to recent prior with significant interval increase in size over more remote priors, findings which are highly suspicious for active neoplastic disease involvement. 2. No new discrete omental or peritoneal nodules/masses identified. 3. Similar nodular thickening of the peritoneum along the bilateral pericolic gutters and in the pelvis without ascites. 4. No pathologically enlarged abdominal or pelvic lymph  nodes and no solid organ metastases visualized. 5. Cirrhotic hepatic morphology with evidence of portal hypertension including splenomegaly. 6. Sigmoid colonic diverticulosis without findings of acute diverticulitis. 7. Moderate volume of formed stool throughout the colon. 8. Aortic Atherosclerosis (ICD10-I70.0).   04/11/2021 Tumor Marker   Patient's tumor was tested for the following markers: CA-125. Results of the tumor marker test revealed 79.4 .   04/24/2021 -  Chemotherapy   Patient is on Treatment Plan : Ovarian Docetaxel q21d       PHYSICAL EXAMINATION: ECOG PERFORMANCE STATUS: 1 - Symptomatic but completely ambulatory  Vitals:   04/12/21 0812  BP: 140/70  Pulse: 71  Resp: 18  Temp: 97.7 F (36.5 C)  SpO2: 100%   Filed Weights   04/12/21 0812  Weight: 179 lb (81.2 kg)    GENERAL:alert, no distress and comfortable  NEURO: alert & oriented x 3 with fluent speech, no focal motor/sensory deficits  LABORATORY DATA:  I have reviewed the data as listed    Component Value Date/Time   NA 141 04/10/2021 0740   NA 142 02/06/2017 0742   K 4.0 04/10/2021 0740   K 3.9 02/06/2017 0742   CL 107 04/10/2021 0740   CO2 28 04/10/2021 0740   CO2 22 02/06/2017 0742   GLUCOSE 99 04/10/2021 0740   GLUCOSE 156 (H) 02/06/2017 0742   BUN 17 04/10/2021 0740   BUN 13.5 02/06/2017 0742   CREATININE 0.90 04/10/2021 0740   CREATININE 0.85 10/26/2019 0722   CREATININE 0.8 02/06/2017 0742   CALCIUM 8.8 (L) 04/10/2021 0740   CALCIUM 8.5 02/06/2017 0742   PROT 7.5 04/10/2021 0740   PROT 6.9 02/06/2017 0742   ALBUMIN 3.9 04/10/2021 0740   ALBUMIN 3.7 02/06/2017 0742   AST 20 04/10/2021 0740   AST 24 10/26/2019 0722   AST 47 (H) 02/06/2017 0742   ALT 20 04/10/2021 0740   ALT 17 10/26/2019 0722   ALT 54 02/06/2017 0742   ALKPHOS 86 04/10/2021 0740   ALKPHOS 130 02/06/2017 0742   BILITOT 0.5 04/10/2021 0740   BILITOT 0.7 10/26/2019 0722   BILITOT 0.65 02/06/2017 0742   GFRNONAA  >60 04/10/2021 0740   GFRNONAA >60 10/26/2019 0722   GFRAA >60 11/09/2019 0953   GFRAA >60 10/26/2019  1610    No results found for: SPEP, UPEP  Lab Results  Component Value Date   WBC 3.8 (L) 04/10/2021   NEUTROABS 2.5 04/10/2021   HGB 12.6 04/10/2021   HCT 38.0 04/10/2021   MCV 92.7 04/10/2021   PLT 88 (L) 04/10/2021      Chemistry      Component Value Date/Time   NA 141 04/10/2021 0740   NA 142 02/06/2017 0742   K 4.0 04/10/2021 0740   K 3.9 02/06/2017 0742   CL 107 04/10/2021 0740   CO2 28 04/10/2021 0740   CO2 22 02/06/2017 0742   BUN 17 04/10/2021 0740   BUN 13.5 02/06/2017 0742   CREATININE 0.90 04/10/2021 0740   CREATININE 0.85 10/26/2019 0722   CREATININE 0.8 02/06/2017 0742      Component Value Date/Time   CALCIUM 8.8 (L) 04/10/2021 0740   CALCIUM 8.5 02/06/2017 0742   ALKPHOS 86 04/10/2021 0740   ALKPHOS 130 02/06/2017 0742   AST 20 04/10/2021 0740   AST 24 10/26/2019 0722   AST 47 (H) 02/06/2017 0742   ALT 20 04/10/2021 0740   ALT 17 10/26/2019 0722   ALT 54 02/06/2017 0742   BILITOT 0.5 04/10/2021 0740   BILITOT 0.7 10/26/2019 0722   BILITOT 0.65 02/06/2017 0742       RADIOGRAPHIC STUDIES: I have reviewed multiple imaging studies with the patient I have personally reviewed the radiological images as listed and agreed with the findings in the report. CT ABDOMEN PELVIS W CONTRAST  Result Date: 04/11/2021 CLINICAL DATA:  History of ovarian cancer with peritoneal carcinomatosis. Completed chemo therapy and radiation. Prior total abdominal hysterectomy and omentectomy EXAM: CT ABDOMEN AND PELVIS WITH CONTRAST TECHNIQUE: Multidetector CT imaging of the abdomen and pelvis was performed using the standard protocol following bolus administration of intravenous contrast. RADIATION DOSE REDUCTION: This exam was performed according to the departmental dose-optimization program which includes automated exposure control, adjustment of the mA and/or kV according to  patient size and/or use of iterative reconstruction technique. CONTRAST:  OMNIPAQUE IOHEXOL 300 MG/ML  SOLN COMPARISON:  Multiple priors including most recent CT February 08, 2021 FINDINGS: Lower chest: Partially visualized breast prostheses. Bilateral atelectasis versus scarring. Hepatobiliary: Cirrhotic hepatic morphology. No enhancing hepatic lesion. Probable tiny dependent cholelithiasis without findings of acute cholecystitis. No biliary ductal dilation. Pancreas: No pancreatic ductal dilation or evidence of acute inflammation. Spleen: Splenomegaly with the spleen measuring 13.9 cm in maximum craniocaudal dimension previously 15.1. Adrenals/Urinary Tract: Bilateral adrenal glands appear normal. No hydronephrosis. Left cortical renal scarring. Kidneys demonstrate symmetric enhancement and excretion of contrast material. Urinary bladder is unremarkable for degree of distension. Stomach/Bowel: Radiopaque enteric contrast material traverses the terminal ileum. Stomach is nondistended limiting evaluation. No pathologic dilation of small or large bowel. Terminal ileum appears normal. Appendix is not confidently identified. Moderate volume of formed stool throughout the colon. Sigmoid colonic diverticulosis without findings of acute diverticulitis. Vascular/Lymphatic: Aortic atherosclerosis without abdominal aortic aneurysm. No pathologically enlarged abdominal or pelvic lymph nodes. Prominent retroperitoneal and mesenteric lymph nodes measuring up to 5 mm are similar prior. Reproductive: Uterus is surgically absent. Other: Soft tissue mass in the posterior pelvis adjacent to the sigmoid colon measures 3.0 x 2.7 x 2.8 cm (volume = 12 cm^3)on images 75/2 and 62/4 previously measuring 2.6 x 2.2 x 2.4 cm (volume = 7.2 cm^3)when remeasured for consistency on CT February 08, 2021 and 2.2 x 1.4 x 1.6 cm (volume = 2.6 cm^3)on CT September 11, 2020  when remeasured for consistency. There is similar nodular thickening of the  peritoneum along the bilateral pericolic gutters and in the pelvis. There is similar fat stranding along the residual of the omentum without discrete omental nodularity. No significant abdominopelvic free fluid. Musculoskeletal: Similar advanced multilevel degenerative changes spine with L3 and L4 superior endplate deformities. No aggressive lytic or blastic lesion of bone. IMPRESSION: 1. Left posterior pelvic soft tissue mass, adjacent to the sigmoid colon, has slightly increased in size in comparison to recent prior with significant interval increase in size over more remote priors, findings which are highly suspicious for active neoplastic disease involvement. 2. No new discrete omental or peritoneal nodules/masses identified. 3. Similar nodular thickening of the peritoneum along the bilateral pericolic gutters and in the pelvis without ascites. 4. No pathologically enlarged abdominal or pelvic lymph nodes and no solid organ metastases visualized. 5. Cirrhotic hepatic morphology with evidence of portal hypertension including splenomegaly. 6. Sigmoid colonic diverticulosis without findings of acute diverticulitis. 7. Moderate volume of formed stool throughout the colon. 8. Aortic Atherosclerosis (ICD10-I70.0). Electronically Signed   By: Maudry Mayhew M.D.   On: 04/11/2021 11:04

## 2021-04-12 NOTE — Progress Notes (Signed)
OFF PATHWAY REGIMEN - Other ? ?No Change  Continue With Treatment as Ordered. ? ?Original Decision Date/Time: 12/15/2018 11:47 ? ? ?[id number]:Docetaxel 75 mg/m2: ?  A cycle is every 21 days: ?    Docetaxel  ? ?**Always confirm dose/schedule in your pharmacy ordering system** ? ?Patient Characteristics: ?Intent of Therapy: ?Non-Curative / Palliative Intent, Discussed with Patient ?

## 2021-04-12 NOTE — Assessment & Plan Note (Signed)
I am concerned that her slight worsening constipation could be related to her peritoneal disease ?We discussed the importance of aggressive laxatives ?

## 2021-04-12 NOTE — Assessment & Plan Note (Signed)
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

## 2021-04-13 ENCOUNTER — Telehealth: Payer: Self-pay

## 2021-04-13 ENCOUNTER — Other Ambulatory Visit: Payer: Self-pay | Admitting: Hematology and Oncology

## 2021-04-13 NOTE — Telephone Encounter (Signed)
Called and left a message. On her next treatment she will be getting onpro. Insurance does not require prior authorization. Ask her to call the office back with questions. ?

## 2021-04-16 NOTE — Progress Notes (Signed)
Pharmacist Chemotherapy Monitoring - Initial Assessment   ? ?Anticipated start date: 04/23/21  ? ?The following has been reviewed per standard work regarding the patient's treatment regimen: ?The patient's diagnosis, treatment plan and drug doses, and organ/hematologic function ?Lab orders and baseline tests specific to treatment regimen  ?The treatment plan start date, drug sequencing, and pre-medications ?Prior authorization status  ?Patient's documented medication list, including drug-drug interaction screen and prescriptions for anti-emetics and supportive care specific to the treatment regimen ?The drug concentrations, fluid compatibility, administration routes, and timing of the medications to be used ?The patient's access for treatment and lifetime cumulative dose history, if applicable  ?The patient's medication allergies and previous infusion related reactions, if applicable  ? ?Changes made to treatment plan:  ?N/A ? ?Follow up needed:  ?N/A ? ? ?Larene Beach, RPH, ?04/16/2021  3:46 PM ? ?

## 2021-04-20 MED FILL — Dexamethasone Sodium Phosphate Inj 100 MG/10ML: INTRAMUSCULAR | Qty: 1 | Status: AC

## 2021-04-23 ENCOUNTER — Inpatient Hospital Stay: Payer: Medicare Other

## 2021-04-23 ENCOUNTER — Other Ambulatory Visit: Payer: Self-pay | Admitting: Hematology and Oncology

## 2021-04-23 ENCOUNTER — Other Ambulatory Visit: Payer: Self-pay

## 2021-04-23 VITALS — BP 131/71 | HR 65 | Temp 98.1°F | Resp 17

## 2021-04-23 DIAGNOSIS — Z5111 Encounter for antineoplastic chemotherapy: Secondary | ICD-10-CM | POA: Diagnosis not present

## 2021-04-23 DIAGNOSIS — Z7189 Other specified counseling: Secondary | ICD-10-CM

## 2021-04-23 DIAGNOSIS — D509 Iron deficiency anemia, unspecified: Secondary | ICD-10-CM

## 2021-04-23 DIAGNOSIS — C482 Malignant neoplasm of peritoneum, unspecified: Secondary | ICD-10-CM

## 2021-04-23 LAB — CBC WITH DIFFERENTIAL/PLATELET
Abs Immature Granulocytes: 0.01 10*3/uL (ref 0.00–0.07)
Basophils Absolute: 0 10*3/uL (ref 0.0–0.1)
Basophils Relative: 0 %
Eosinophils Absolute: 0.1 10*3/uL (ref 0.0–0.5)
Eosinophils Relative: 2 %
HCT: 35.5 % — ABNORMAL LOW (ref 36.0–46.0)
Hemoglobin: 12.2 g/dL (ref 12.0–15.0)
Immature Granulocytes: 0 %
Lymphocytes Relative: 26 %
Lymphs Abs: 1.2 10*3/uL (ref 0.7–4.0)
MCH: 31.7 pg (ref 26.0–34.0)
MCHC: 34.4 g/dL (ref 30.0–36.0)
MCV: 92.2 fL (ref 80.0–100.0)
Monocytes Absolute: 0.2 10*3/uL (ref 0.1–1.0)
Monocytes Relative: 5 %
Neutro Abs: 2.9 10*3/uL (ref 1.7–7.7)
Neutrophils Relative %: 67 %
Platelets: 77 10*3/uL — ABNORMAL LOW (ref 150–400)
RBC: 3.85 MIL/uL — ABNORMAL LOW (ref 3.87–5.11)
RDW: 13.8 % (ref 11.5–15.5)
WBC: 4.4 10*3/uL (ref 4.0–10.5)
nRBC: 0 % (ref 0.0–0.2)

## 2021-04-23 LAB — COMPREHENSIVE METABOLIC PANEL
ALT: 21 U/L (ref 0–44)
AST: 19 U/L (ref 15–41)
Albumin: 3.9 g/dL (ref 3.5–5.0)
Alkaline Phosphatase: 87 U/L (ref 38–126)
Anion gap: 7 (ref 5–15)
BUN: 18 mg/dL (ref 8–23)
CO2: 27 mmol/L (ref 22–32)
Calcium: 8.5 mg/dL — ABNORMAL LOW (ref 8.9–10.3)
Chloride: 109 mmol/L (ref 98–111)
Creatinine, Ser: 0.91 mg/dL (ref 0.44–1.00)
GFR, Estimated: >60 mL/min (ref >60)
Glucose, Bld: 92 mg/dL (ref 70–99)
Potassium: 3.5 mmol/L (ref 3.5–5.1)
Sodium: 143 mmol/L (ref 135–145)
Total Bilirubin: 0.5 mg/dL (ref 0.3–1.2)
Total Protein: 7.1 g/dL (ref 6.5–8.1)

## 2021-04-23 LAB — FERRITIN: Ferritin: 121 ng/mL (ref 11–307)

## 2021-04-23 MED ORDER — PEGFILGRASTIM 6 MG/0.6ML ~~LOC~~ PSKT
6.0000 mg | PREFILLED_SYRINGE | Freq: Once | SUBCUTANEOUS | Status: AC
  Administered 2021-04-23: 6 mg via SUBCUTANEOUS
  Filled 2021-04-23: qty 0.6

## 2021-04-23 MED ORDER — SODIUM CHLORIDE 0.9% FLUSH
10.0000 mL | INTRAVENOUS | Status: DC | PRN
  Administered 2021-04-23: 10 mL

## 2021-04-23 MED ORDER — SODIUM CHLORIDE 0.9 % IV SOLN: Freq: Once | INTRAVENOUS | Status: AC

## 2021-04-23 MED ORDER — SODIUM CHLORIDE 0.9 % IV SOLN
10.0000 mg | Freq: Once | INTRAVENOUS | Status: AC
  Administered 2021-04-23: 10 mg via INTRAVENOUS
  Filled 2021-04-23: qty 10

## 2021-04-23 MED ORDER — SODIUM CHLORIDE 0.9 % IV SOLN
37.5000 mg/m2 | Freq: Once | INTRAVENOUS | Status: AC
  Administered 2021-04-23: 70 mg via INTRAVENOUS
  Filled 2021-04-23: qty 7

## 2021-04-23 MED ORDER — SODIUM CHLORIDE 0.9% FLUSH
10.0000 mL | Freq: Once | INTRAVENOUS | Status: AC
  Administered 2021-04-23: 10 mL

## 2021-04-23 MED ORDER — HEPARIN SOD (PORK) LOCK FLUSH 100 UNIT/ML IV SOLN
500.0000 [IU] | Freq: Once | INTRAVENOUS | Status: AC | PRN
  Administered 2021-04-23: 500 [IU]

## 2021-04-23 NOTE — Patient Instructions (Signed)
[facility]   Discharge Instructions: Thank you for choosing [facility] to provide your oncology and hematology care.   If you have a lab appointment with the [name], please go directly to the [facility] and check in at the registration area.   Wear comfortable clothing and clothing appropriate for easy access to any Portacath or PICC line.   We strive to give you quality time with your provider. You may need to reschedule your appointment if you arrive late (15 or more minutes).  Arriving late affects you and other patients whose appointments are after yours.  Also, if you miss three or more appointments without notifying the office, you may be dismissed from the clinic at the providers discretion.      For prescription refill requests, have your pharmacy contact our office and allow 72 hours for refills to be completed.    Today you received the following chemotherapy and/or immunotherapy agents: Taxotere / Neulasta      To help prevent nausea and vomiting after your treatment, we encourage you to take your nausea medication as directed.  BELOW ARE SYMPTOMS THAT SHOULD BE REPORTED IMMEDIATELY: *FEVER GREATER THAN 100.4 F (38 C) OR HIGHER *CHILLS OR SWEATING *NAUSEA AND VOMITING THAT IS NOT CONTROLLED WITH YOUR NAUSEA MEDICATION *UNUSUAL SHORTNESS OF BREATH *UNUSUAL BRUISING OR BLEEDING *URINARY PROBLEMS (pain or burning when urinating, or frequent urination) *BOWEL PROBLEMS (unusual diarrhea, constipation, pain near the anus) TENDERNESS IN MOUTH AND THROAT WITH OR WITHOUT PRESENCE OF ULCERS (sore throat, sores in mouth, or a toothache) UNUSUAL RASH, SWELLING OR PAIN  UNUSUAL VAGINAL DISCHARGE OR ITCHING   Items with * indicate a potential emergency and should be followed up as soon as possible or go to the Emergency Department if any problems should occur.  Please show the CHEMOTHERAPY ALERT CARD or IMMUNOTHERAPY ALERT CARD at  check-in to the Emergency Department and triage nurse.  Should you have questions after your visit or need to cancel or reschedule your appointment, please contact [facility]  Dept: [phone]  and follow the prompts.  Office hours are 8:00 a.m. to 4:30 p.m. Monday - Friday. Please note that voicemails left after 4:00 p.m. may not be returned until the following business day.  We are closed weekends and major holidays. You have access to a nurse at all times for urgent questions. Please call the main number to the clinic Dept: [phone] and follow the prompts.   For any non-urgent questions, you may also contact your provider using MyChart. We now offer e-Visits for anyone 25 and older to request care online for non-urgent symptoms. For details visit [email or web address].   Also download the MyChart app! Go to the app store, search "MyChart", open the app, select [facility], and log in with your MyChart username and password.  Due to Covid, a mask is required upon entering the hospital/clinic. If you do not have a mask, one will be given to you upon arrival. For doctor visits, patients may have 1 support Stacee Earp aged 74 or older with them. For treatment visits, patients cannot have anyone with them due to current Covid guidelines and our immunocompromised population.   Docetaxel injection What is this medication? DOCETAXEL (doe se TAX el) is a chemotherapy drug. It targets fast dividing cells, like cancer cells, and causes these cells to die. This medicine is used to treat many types of cancers like breast cancer, certain stomach cancers, head  and neck cancer, lung cancer, and prostate cancer. This medicine may be used for other purposes; ask your health care provider or pharmacist if you have questions. COMMON BRAND NAME(S): Docefrez, Taxotere What should I tell my care team before I take this medication? They need to know if you have any of these  conditions: infection (especially a virus infection such as chickenpox, cold sores, or herpes) liver disease low blood counts, like low white cell, platelet, or red cell counts an unusual or allergic reaction to docetaxel, polysorbate 80, other chemotherapy agents, other medicines, foods, dyes, or preservatives pregnant or trying to get pregnant breast-feeding How should I use this medication? This drug is given as an infusion into a vein. It is administered in a hospital or clinic by a specially trained health care professional. Talk to your pediatrician regarding the use of this medicine in children. Special care may be needed. Overdosage: If you think you have taken too much of this medicine contact a poison control center or emergency room at once. NOTE: This medicine is only for you. Do not share this medicine with others. What if I miss a dose? It is important not to miss your dose. Call your doctor or health care professional if you are unable to keep an appointment. What may interact with this medication? Do not take this medicine with any of the following medications: live virus vaccines This medicine may also interact with the following medications: aprepitant certain antibiotics like erythromycin or clarithromycin certain antivirals for HIV or hepatitis certain medicines for fungal infections like fluconazole, itraconazole, ketoconazole, posaconazole, or voriconazole cimetidine ciprofloxacin conivaptan cyclosporine dronedarone fluvoxamine grapefruit juice imatinib verapamil This list may not describe all possible interactions. Give your health care provider a list of all the medicines, herbs, non-prescription drugs, or dietary supplements you use. Also tell them if you smoke, drink alcohol, or use illegal drugs. Some items may interact with your medicine. What should I watch for while using this medication? Your condition will be monitored carefully while you are receiving  this medicine. You will need important blood work done while you are taking this medicine. Call your doctor or health care professional for advice if you get a fever, chills or sore throat, or other symptoms of a cold or flu. Do not treat yourself. This drug decreases your body's ability to fight infections. Try to avoid being around people who are sick. Some products may contain alcohol. Ask your health care professional if this medicine contains alcohol. Be sure to tell all health care professionals you are taking this medicine. Certain medicines, like metronidazole and disulfiram, can cause an unpleasant reaction when taken with alcohol. The reaction includes flushing, headache, nausea, vomiting, sweating, and increased thirst. The reaction can last from 30 minutes to several hours. You may get drowsy or dizzy. Do not drive, use machinery, or do anything that needs mental alertness until you know how this medicine affects you. Do not stand or sit up quickly, especially if you are an older patient. This reduces the risk of dizzy or fainting spells. Alcohol may interfere with the effect of this medicine. Talk to your health care professional about your risk of cancer. You may be more at risk for certain types of cancer if you take this medicine. Do not become pregnant while taking this medicine or for 6 months after stopping it. Women should inform their doctor if they wish to become pregnant or think they might be pregnant. There is a potential for serious  side effects to an unborn child. Talk to your health care professional or pharmacist for more information. Do not breast-feed an infant while taking this medicine or for 1 week after stopping it. Males who get this medicine must use a condom during sex with females who can get pregnant. If you get a woman pregnant, the baby could have birth defects. The baby could die before they are born. You will need to continue wearing a condom for 3 months after  stopping the medicine. Tell your health care provider right away if your partner becomes pregnant while you are taking this medicine. This may interfere with the ability to father a child. You should talk to your doctor or health care professional if you are concerned about your fertility. What side effects may I notice from receiving this medication? Side effects that you should report to your doctor or health care professional as soon as possible: allergic reactions like skin rash, itching or hives, swelling of the face, lips, or tongue blurred vision breathing problems changes in vision low blood counts - This drug may decrease the number of white blood cells, red blood cells and platelets. You may be at increased risk for infections and bleeding. nausea and vomiting pain, redness or irritation at site where injected pain, tingling, numbness in the hands or feet redness, blistering, peeling, or loosening of the skin, including inside the mouth signs of decreased platelets or bleeding - bruising, pinpoint red spots on the skin, black, tarry stools, nosebleeds signs of decreased red blood cells - unusually weak or tired, fainting spells, lightheadedness signs of infection - fever or chills, cough, sore throat, pain or difficulty passing urine swelling of the ankle, feet, hands Side effects that usually do not require medical attention (report to your doctor or health care professional if they continue or are bothersome): constipation diarrhea fingernail or toenail changes hair loss loss of appetite mouth sores muscle pain This list may not describe all possible side effects. Call your doctor for medical advice about side effects. You may report side effects to FDA at 1-800-FDA-1088. Where should I keep my medication? This drug is given in a hospital or clinic and will not be stored at home. NOTE: This sheet is a summary. It may not cover all possible information. If you have questions  about this medicine, talk to your doctor, pharmacist, or health care provider.  2022 Elsevier/Gold Standard (2020-10-17 00:00:00)  Pegfilgrastim Injection What is this medication? PEGFILGRASTIM (PEG fil gra stim) lowers the risk of infection in people who are receiving chemotherapy. It works by Building control surveyor make more white blood cells, which protects your body from infection. It may also be used to help people who have been exposed to high doses of radiation. This medicine may be used for other purposes; ask your health care provider or pharmacist if you have questions. COMMON BRAND NAME(S): Rexene Edison, Ziextenzo What should I tell my care team before I take this medication? They need to know if you have any of these conditions: Kidney disease Latex allergy Ongoing radiation therapy Sickle cell disease Skin reactions to acrylic adhesives (On-Body Injector only) An unusual or allergic reaction to pegfilgrastim, filgrastim, other medications, foods, dyes, or preservatives Pregnant or trying to get pregnant Breast-feeding How should I use this medication? This medication is for injection under the skin. If you get this medication at home, you will be taught how to prepare and give the pre-filled syringe or how to use the  On-body Injector. Refer to the patient Instructions for Use for detailed instructions. Use exactly as directed. Tell your care team immediately if you suspect that the On-body Injector may not have performed as intended or if you suspect the use of the On-body Injector resulted in a missed or partial dose. It is important that you put your used needles and syringes in a special sharps container. Do not put them in a trash can. If you do not have a sharps container, call your pharmacist or care team to get one. Talk to your care team about the use of this medication in children. While this medication may be prescribed for selected conditions,  precautions do apply. Overdosage: If you think you have taken too much of this medicine contact a poison control center or emergency room at once. NOTE: This medicine is only for you. Do not share this medicine with others. What if I miss a dose? It is important not to miss your dose. Call your care team if you miss your dose. If you miss a dose due to an On-body Injector failure or leakage, a new dose should be administered as soon as possible using a single prefilled syringe for manual use. What may interact with this medication? Interactions have not been studied. This list may not describe all possible interactions. Give your health care provider a list of all the medicines, herbs, non-prescription drugs, or dietary supplements you use. Also tell them if you smoke, drink alcohol, or use illegal drugs. Some items may interact with your medicine. What should I watch for while using this medication? Your condition will be monitored carefully while you are receiving this medication. You may need blood work done while you are taking this medication. Talk to your care team about your risk of cancer. You may be more at risk for certain types of cancer if you take this medication. If you are going to need a MRI, CT scan, or other procedure, tell your care team that you are using this medication (On-Body Injector only). What side effects may I notice from receiving this medication? Side effects that you should report to your care team as soon as possible: Allergic reactions--skin rash, itching, hives, swelling of the face, lips, tongue, or throat Capillary leak syndrome--stomach or muscle pain, unusual weakness or fatigue, feeling faint or lightheaded, decrease in the amount of urine, swelling of the ankles, hands, or feet, trouble breathing High white blood cell level--fever, fatigue, trouble breathing, night sweats, change in vision, weight loss Inflammation of the aorta--fever, fatigue, back, chest,  or stomach pain, severe headache Kidney injury (glomerulonephritis)--decrease in the amount of urine, red or dark brown urine, foamy or bubbly urine, swelling of the ankles, hands, or feet Shortness of breath or trouble breathing Spleen injury--pain in upper left stomach or shoulder Unusual bruising or bleeding Side effects that usually do not require medical attention (report to your care team if they continue or are bothersome): Bone pain Pain in the hands or feet This list may not describe all possible side effects. Call your doctor for medical advice about side effects. You may report side effects to FDA at 1-800-FDA-1088. Where should I keep my medication? Keep out of the reach of children. If you are using this medication at home, you will be instructed on how to store it. Throw away any unused medication after the expiration date on the label. NOTE: This sheet is a summary. It may not cover all possible information. If you have questions  about this medicine, talk to your doctor, pharmacist, or health care provider.  2022 Elsevier/Gold Standard (2020-10-17 00:00:00)

## 2021-04-24 LAB — CA 125: Cancer Antigen (CA) 125: 68.5 U/mL — ABNORMAL HIGH (ref 0.0–38.1)

## 2021-04-25 ENCOUNTER — Ambulatory Visit: Payer: Medicare Other

## 2021-04-26 ENCOUNTER — Other Ambulatory Visit: Payer: Self-pay | Admitting: Gastroenterology

## 2021-05-15 ENCOUNTER — Inpatient Hospital Stay: Payer: Medicare Other

## 2021-05-15 ENCOUNTER — Other Ambulatory Visit: Payer: Self-pay

## 2021-05-15 ENCOUNTER — Encounter: Payer: Self-pay | Admitting: Hematology and Oncology

## 2021-05-15 ENCOUNTER — Inpatient Hospital Stay (HOSPITAL_BASED_OUTPATIENT_CLINIC_OR_DEPARTMENT_OTHER): Payer: Medicare Other | Admitting: Hematology and Oncology

## 2021-05-15 ENCOUNTER — Inpatient Hospital Stay: Payer: Medicare Other | Attending: Hematology and Oncology

## 2021-05-15 DIAGNOSIS — Z7189 Other specified counseling: Secondary | ICD-10-CM

## 2021-05-15 DIAGNOSIS — G620 Drug-induced polyneuropathy: Secondary | ICD-10-CM | POA: Insufficient documentation

## 2021-05-15 DIAGNOSIS — G629 Polyneuropathy, unspecified: Secondary | ICD-10-CM | POA: Diagnosis not present

## 2021-05-15 DIAGNOSIS — C482 Malignant neoplasm of peritoneum, unspecified: Secondary | ICD-10-CM | POA: Diagnosis not present

## 2021-05-15 DIAGNOSIS — D509 Iron deficiency anemia, unspecified: Secondary | ICD-10-CM

## 2021-05-15 DIAGNOSIS — D61818 Other pancytopenia: Secondary | ICD-10-CM

## 2021-05-15 DIAGNOSIS — Z79899 Other long term (current) drug therapy: Secondary | ICD-10-CM | POA: Insufficient documentation

## 2021-05-15 DIAGNOSIS — K7460 Unspecified cirrhosis of liver: Secondary | ICD-10-CM | POA: Insufficient documentation

## 2021-05-15 DIAGNOSIS — Z7952 Long term (current) use of systemic steroids: Secondary | ICD-10-CM | POA: Diagnosis not present

## 2021-05-15 DIAGNOSIS — T451X5A Adverse effect of antineoplastic and immunosuppressive drugs, initial encounter: Secondary | ICD-10-CM | POA: Diagnosis not present

## 2021-05-15 DIAGNOSIS — Z5111 Encounter for antineoplastic chemotherapy: Secondary | ICD-10-CM | POA: Insufficient documentation

## 2021-05-15 LAB — CBC WITH DIFFERENTIAL/PLATELET
Abs Immature Granulocytes: 0.02 10*3/uL (ref 0.00–0.07)
Basophils Absolute: 0 10*3/uL (ref 0.0–0.1)
Basophils Relative: 0 %
Eosinophils Absolute: 0 10*3/uL (ref 0.0–0.5)
Eosinophils Relative: 0 %
HCT: 35.5 % — ABNORMAL LOW (ref 36.0–46.0)
Hemoglobin: 12 g/dL (ref 12.0–15.0)
Immature Granulocytes: 0 %
Lymphocytes Relative: 21 %
Lymphs Abs: 1.1 10*3/uL (ref 0.7–4.0)
MCH: 31.5 pg (ref 26.0–34.0)
MCHC: 33.8 g/dL (ref 30.0–36.0)
MCV: 93.2 fL (ref 80.0–100.0)
Monocytes Absolute: 0.4 10*3/uL (ref 0.1–1.0)
Monocytes Relative: 7 %
Neutro Abs: 3.7 10*3/uL (ref 1.7–7.7)
Neutrophils Relative %: 72 %
Platelets: 117 10*3/uL — ABNORMAL LOW (ref 150–400)
RBC: 3.81 MIL/uL — ABNORMAL LOW (ref 3.87–5.11)
RDW: 14.8 % (ref 11.5–15.5)
WBC: 5.2 10*3/uL (ref 4.0–10.5)
nRBC: 0 % (ref 0.0–0.2)

## 2021-05-15 LAB — COMPREHENSIVE METABOLIC PANEL
ALT: 20 U/L (ref 0–44)
AST: 21 U/L (ref 15–41)
Albumin: 4.1 g/dL (ref 3.5–5.0)
Alkaline Phosphatase: 81 U/L (ref 38–126)
Anion gap: 8 (ref 5–15)
BUN: 18 mg/dL (ref 8–23)
CO2: 27 mmol/L (ref 22–32)
Calcium: 9 mg/dL (ref 8.9–10.3)
Chloride: 109 mmol/L (ref 98–111)
Creatinine, Ser: 0.82 mg/dL (ref 0.44–1.00)
GFR, Estimated: >60 mL/min (ref >60)
Glucose, Bld: 84 mg/dL (ref 70–99)
Potassium: 3.8 mmol/L (ref 3.5–5.1)
Sodium: 144 mmol/L (ref 135–145)
Total Bilirubin: 0.5 mg/dL (ref 0.3–1.2)
Total Protein: 7.5 g/dL (ref 6.5–8.1)

## 2021-05-15 LAB — FERRITIN: Ferritin: 152 ng/mL (ref 11–307)

## 2021-05-15 MED ORDER — HEPARIN SOD (PORK) LOCK FLUSH 100 UNIT/ML IV SOLN
500.0000 [IU] | Freq: Once | INTRAVENOUS | Status: AC | PRN
  Administered 2021-05-15: 500 [IU]

## 2021-05-15 MED ORDER — HYDROCODONE-ACETAMINOPHEN 5-325 MG PO TABS: 1.0000 | ORAL_TABLET | Freq: Four times a day (QID) | ORAL | 0 refills | Status: DC | PRN

## 2021-05-15 MED ORDER — SODIUM CHLORIDE 0.9 % IV SOLN
10.0000 mg | Freq: Once | INTRAVENOUS | Status: AC
  Administered 2021-05-15: 10 mg via INTRAVENOUS
  Filled 2021-05-15: qty 10

## 2021-05-15 MED ORDER — PEGFILGRASTIM 6 MG/0.6ML ~~LOC~~ PSKT
6.0000 mg | PREFILLED_SYRINGE | Freq: Once | SUBCUTANEOUS | Status: AC
  Administered 2021-05-15: 6 mg via SUBCUTANEOUS
  Filled 2021-05-15: qty 0.6

## 2021-05-15 MED ORDER — SODIUM CHLORIDE 0.9% FLUSH
10.0000 mL | INTRAVENOUS | Status: DC | PRN
  Administered 2021-05-15: 10 mL

## 2021-05-15 MED ORDER — SODIUM CHLORIDE 0.9 % IV SOLN: Freq: Once | INTRAVENOUS | Status: AC

## 2021-05-15 MED ORDER — GABAPENTIN 300 MG PO CAPS: 900.0000 mg | ORAL_CAPSULE | Freq: Two times a day (BID) | ORAL | 11 refills | Status: DC

## 2021-05-15 MED ORDER — SODIUM CHLORIDE 0.9 % IV SOLN
37.5000 mg/m2 | Freq: Once | INTRAVENOUS | Status: AC
  Administered 2021-05-15: 70 mg via INTRAVENOUS
  Filled 2021-05-15: qty 7

## 2021-05-15 MED ORDER — SODIUM CHLORIDE 0.9% FLUSH
10.0000 mL | Freq: Once | INTRAVENOUS | Status: AC
  Administered 2021-05-15: 10 mL

## 2021-05-15 NOTE — Patient Instructions (Signed)
[facility]  ? Discharge Instructions: ?Thank you for choosing [facility] to provide your oncology and hematology care.  ? ?If you have a lab appointment with the [facility], please go directly to the [facility] and check in at the registration area. ?  ?Wear comfortable clothing and clothing appropriate for easy access to any Portacath or PICC line.  ? ?We strive to give you quality time with your provider. You may need to reschedule your appointment if you arrive late (15 or more minutes).  Arriving late affects you and other patients whose appointments are after yours.  Also, if you miss three or more appointments without notifying the office, you may be dismissed from the clinic at the provider?s discretion.    ?  ?For prescription refill requests, have your pharmacy contact our office and allow 72 hours for refills to be completed.   ? ?Today you received the following chemotherapy and/or immunotherapy agents: Taxotere / Neulasta    ?  ?To help prevent nausea and vomiting after your treatment, we encourage you to take your nausea medication as directed. ? ?BELOW ARE SYMPTOMS THAT SHOULD BE REPORTED IMMEDIATELY: ?*FEVER GREATER THAN 100.4 F (38 ?C) OR HIGHER ?*CHILLS OR SWEATING ?*NAUSEA AND VOMITING THAT IS NOT CONTROLLED WITH YOUR NAUSEA MEDICATION ?*UNUSUAL SHORTNESS OF BREATH ?*UNUSUAL BRUISING OR BLEEDING ?*URINARY PROBLEMS (pain or burning when urinating, or frequent urination) ?*BOWEL PROBLEMS (unusual diarrhea, constipation, pain near the anus) ?TENDERNESS IN MOUTH AND THROAT WITH OR WITHOUT PRESENCE OF ULCERS (sore throat, sores in mouth, or a toothache) ?UNUSUAL RASH, SWELLING OR PAIN  ?UNUSUAL VAGINAL DISCHARGE OR ITCHING  ? ?Items with * indicate a potential emergency and should be followed up as soon as possible or go to the Emergency Department if any problems should occur. ? ?Please show the CHEMOTHERAPY ALERT CARD or IMMUNOTHERAPY ALERT CARD at  check-in to the Emergency Department and triage nurse. ? ?Should you have questions after your visit or need to cancel or reschedule your appointment, please contact [facility]  Dept: [phone]  and follow the prompts.  Office hours are 8:00 a.m. to 4:30 p.m. Monday - Friday. Please note that voicemails left after 4:00 p.m. may not be returned until the following business day.  We are closed weekends and major holidays. You have access to a nurse at all times for urgent questions. Please call the main number to the clinic Dept: [phone] and follow the prompts. ? ? ?For any non-urgent questions, you may also contact your provider using MyChart. We now offer e-Visits for anyone 39 and older to request care online for non-urgent symptoms. For details visit [email or web address]. ?  ?Also download the MyChart app! Go to the app store, search "MyChart", open the app, select [facility], and log in with your MyChart username and password. ? ?Due to Covid, a mask is required upon entering the hospital/clinic. If you do not have a mask, one will be given to you upon arrival. For doctor visits, patients may have 1 support person aged 65 or older with them. For treatment visits, patients cannot have anyone with them due to current Covid guidelines and our immunocompromised population.  ? ?Docetaxel injection ?What is this medication? ?DOCETAXEL (doe se TAX el) is a chemotherapy drug. It targets fast dividing cells, like cancer cells, and causes these cells to die. This medicine is used to treat many types of cancers like breast cancer, certain stomach cancers, head  and neck cancer, lung cancer, and prostate cancer. ?This medicine may be used for other purposes; ask your health care provider or pharmacist if you have questions. ?COMMON BRAND NAME(S): Docefrez, Taxotere ?What should I tell my care team before I take this medication? ?They need to know if you have any of these  conditions: ?infection (especially a virus infection such as chickenpox, cold sores, or herpes) ?liver disease ?low blood counts, like low white cell, platelet, or red cell counts ?an unusual or allergic reaction to docetaxel, polysorbate 80, other chemotherapy agents, other medicines, foods, dyes, or preservatives ?pregnant or trying to get pregnant ?breast-feeding ?How should I use this medication? ?This drug is given as an infusion into a vein. It is administered in a hospital or clinic by a specially trained health care professional. ?Talk to your pediatrician regarding the use of this medicine in children. Special care may be needed. ?Overdosage: If you think you have taken too much of this medicine contact a poison control center or emergency room at once. ?NOTE: This medicine is only for you. Do not share this medicine with others. ?What if I miss a dose? ?It is important not to miss your dose. Call your doctor or health care professional if you are unable to keep an appointment. ?What may interact with this medication? ?Do not take this medicine with any of the following medications: ?live virus vaccines ?This medicine may also interact with the following medications: ?aprepitant ?certain antibiotics like erythromycin or clarithromycin ?certain antivirals for HIV or hepatitis ?certain medicines for fungal infections like fluconazole, itraconazole, ketoconazole, posaconazole, or voriconazole ?cimetidine ?ciprofloxacin ?conivaptan ?cyclosporine ?dronedarone ?fluvoxamine ?grapefruit juice ?imatinib ?verapamil ?This list may not describe all possible interactions. Give your health care provider a list of all the medicines, herbs, non-prescription drugs, or dietary supplements you use. Also tell them if you smoke, drink alcohol, or use illegal drugs. Some items may interact with your medicine. ?What should I watch for while using this medication? ?Your condition will be monitored carefully while you are receiving  this medicine. You will need important blood work done while you are taking this medicine. ?Call your doctor or health care professional for advice if you get a fever, chills or sore throat, or other symptoms of a cold or flu. Do not treat yourself. This drug decreases your body's ability to fight infections. Try to avoid being around people who are sick. ?Some products may contain alcohol. Ask your health care professional if this medicine contains alcohol. Be sure to tell all health care professionals you are taking this medicine. Certain medicines, like metronidazole and disulfiram, can cause an unpleasant reaction when taken with alcohol. The reaction includes flushing, headache, nausea, vomiting, sweating, and increased thirst. The reaction can last from 30 minutes to several hours. ?You may get drowsy or dizzy. Do not drive, use machinery, or do anything that needs mental alertness until you know how this medicine affects you. Do not stand or sit up quickly, especially if you are an older patient. This reduces the risk of dizzy or fainting spells. Alcohol may interfere with the effect of this medicine. ?Talk to your health care professional about your risk of cancer. You may be more at risk for certain types of cancer if you take this medicine. ?Do not become pregnant while taking this medicine or for 6 months after stopping it. Women should inform their doctor if they wish to become pregnant or think they might be pregnant. There is a potential for serious  side effects to an unborn child. Talk to your health care professional or pharmacist for more information. Do not breast-feed an infant while taking this medicine or for 1 week after stopping it. ?Males who get this medicine must use a condom during sex with females who can get pregnant. If you get a woman pregnant, the baby could have birth defects. The baby could die before they are born. You will need to continue wearing a condom for 3 months after  stopping the medicine. Tell your health care provider right away if your partner becomes pregnant while you are taking this medicine. ?This may interfere with the ability to father a child. You should talk to your do

## 2021-05-15 NOTE — Assessment & Plan Note (Signed)
So far, she tolerated treatment well except for persistent pancytopenia from treatment ?She denies worsening peripheral neuropathy ?We will proceed with treatment as scheduled ?Recommend minimum 3-4 cycles of treatment before repeat CT imaging ?

## 2021-05-15 NOTE — Progress Notes (Signed)
[facility] ?OFFICE PROGRESS NOTE ? ?Patient Care Team: ?Jolinda Croak, MD as PCP - General (Family Medicine) ?Ludwig Clarks, DO as Consulting Physician (Neurology) ? ?ASSESSMENT & PLAN:  ?Primary peritoneal carcinomatosis ([place]) ?So far, she tolerated treatment well except for persistent pancytopenia from treatment ?She denies worsening peripheral neuropathy ?We will proceed with treatment as scheduled ?Recommend minimum 3-4 cycles of treatment before repeat CT imaging ? 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 ?Chemotherapy-induced neuropathy ([place]) ?She is taking both pain medicine and gabapentin ?I refilled her prescription today ? ?No orders of the defined types were placed in this encounter. ? ? ?All questions were answered. The patient knows to call the clinic with any problems, questions or concerns. ?The total time spent in the appointment was 20 minutes encounter with patients including review of chart and various tests results, discussions about plan of care and coordination of care plan ?  ?[name], MD ?05/15/2021 1:59 PM ? ?INTERVAL HISTORY: ?Please see below for problem oriented charting. ?she returns for treatment follow-up seen prior to Taxotere ?She is doing well ?No recent bleeding ?No worsening neuropathy ?Her chronic pain is stable ? ?REVIEW OF SYSTEMS:   ?Constitutional: Denies fevers, chills or abnormal weight loss ?Eyes: Denies blurriness of vision ?Ears, nose, mouth, throat, and face: Denies mucositis or sore throat ?Respiratory: Denies cough, dyspnea or wheezes ?Cardiovascular: Denies palpitation, chest discomfort or lower extremity swelling ?Gastrointestinal:  Denies nausea, heartburn or change in bowel habits ?Skin: Denies abnormal skin rashes ?Lymphatics:  Denies new lymphadenopathy or easy bruising ?Neurological:Denies numbness, tingling or new weaknesses ?Behavioral/Psych: Mood is stable, no new changes  ?All other systems were reviewed with the patient and are negative. ? ?I have reviewed the past medical history, past surgical history, social history and family history with the patient and they are unchanged from previous note. ? ?ALLERGIES:  is allergic to carboplatin, shellfish allergy, vancomycin, benadryl [diphenhydramine hcl], penicillins, and tape. ? ?MEDICATIONS:  ?Current Outpatient Medications  ?Medication Sig Dispense Refill  ? calcium elemental as carbonate (BARIATRIC TUMS ULTRA) 400 MG chewable tablet Chew 1 tablet by mouth 2 (two) times daily.    ? Cholecalciferol (VITAMIN D) 50 MCG (2000 UT) tablet Take 2,000 Units by mouth at bedtime.    ? dexamethasone (DECADRON) 4 MG tablet Take 1 tablet the day before chemo and daily after chemo for 2 days. Please dispense 18 tabs for all 6 cycles of treatment 18 tablet 0  ? gabapentin (NEURONTIN) 300 MG capsule Take 3 capsules (900 mg total) by mouth 2 (two) times daily. 120 capsule 11  ? HYDROcodone-acetaminophen (NORCO/VICODIN) 5-325 MG tablet Take 1 tablet by mouth every 6 (six) hours as needed for moderate pain. 60 tablet 0  ? Insulin Degludec 100 UNIT/ML SOLN Inject 30 Units into the skin daily.    ? lidocaine-prilocaine (EMLA) cream Apply to Porta-cath  1-2 hours prior to access as directed. (Patient taking differently: Apply 1 application topically daily as needed (port access).) 30 g 9  ? ondansetron (ZOFRAN) 8 MG tablet TAKE 1 TABLET(8 MG) BY MOUTH EVERY 8 HOURS AS NEEDED 60 tablet 1  ? pantoprazole (PROTONIX) 40 MG tablet Take 1 tablet (40 mg total) by mouth daily.    ? prochlorperazine (COMPAZINE) 10 MG tablet Take 1  tablet (10 mg total) by mouth every 6 (six) hours as needed (Nausea or vomiting). 30 tablet 1  ? rotigotine (NEUPRO) 4 MG/24HR APPLY 1 PATCH TOPICALLY TO THE SKIN DAILY 90 patch 3  ?  SEMAGLUTIDE,0.25 OR 0.5MG/DOS, [place] Inject 0.25 mg into the skin once a week.    ? ?No current facility-administered medications for this visit.  ? ?Facility-Administered Medications Ordered in Other Visits  ?Medication Dose Route Frequency Provider Last Rate Last Admin  ? sodium chloride flush (NS) 0.9 % injection 10 mL  10 mL Intracatheter PRN Alvy Bimler, Shanyn Preisler, MD   10 mL at 05/15/21 1056  ? ? 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 Had allergic reaction to carboplatin. Avastin was stopped due to GI hemorrhage ?  ?Primary peritoneal carcinomatosis ([place])  ?09/06/2011 Imaging  ? CT findings consistent with cirrhotic changes involving the liver.  No worrisome liver mass.  There are associated portal venous collaterals and splenomegaly consistent with portal venous hypertension. 2.  No other significant upper abdominal findings ?  ?11/08/2013 Imaging  ? US showed ascites ?  ?11/08/2013 Initial Diagnosis  ? Patient had ~ 2 months of some urinary incontinence, saw Dr Ronita Hipps in 10-2013, with pelvic mass on exam  ?  ?11/12/2013 Imaging  ? There are findings of progressive hepatic cirrhosis with a large amount of ascites and mild splenomegaly. There are venous collaterals present. 2. There is abnormal thickening of the peritoneal surface along the left mid and lower abdominal wall. There is abnormal soft tissue density in the pelvis as well which could reflect the clinically suspected ovarian malignancy but the findings are nondiagnostic. 3. There is a new small left pleural effusion. 4. There is no acute bowel abnormality. ?  ?11/18/2013 Imaging  ? Successful ultrasound guided diagnostic and therapeutic paracentesis yielding 4.1 liters of ascites. ?  ?  ?11/18/2013 Pathology Results  ? PERITONEAL/ASCITIC FLUID (SPECIMEN 1 OF 1 COLLECTED 11/18/13): ?SEROUS CARCINOMA, PLEASE SEE COMMENT. ?  ?12/01/2013 Tumor Marker  ? Patient's  tumor was tested for the following markers: CA125 ?Results of the tumor marker test revealed 1938 ?  ?12/03/2013 Imaging  ? Successful ultrasound-guided therapeutic paracentesis yielding 3.6 liters of peritoneal fluid. ?  ?12/07/2013 - 05/30/2014 Chemotherapy  ? She received 3 cycles of carboplatin and Taxol, interrupted cycle 4 for surgery.  She subsequently completed 3 more cycles of chemotherapy after surgery ?  ?12/20/2013 Imaging  ? Successful ultrasound-guided diagnostic and therapeutic paracentesis yielding 2.2 liters of peritoneal fluid ?  ?12/20/2013 Pathology Results  ? PERITONEAL/ASCITIC FLUID(SPECIMEN 1 OF 1 COLLECTED 12/20/13): ?MALIGNANT CELLS CONSISTENT WITH SEROUS CARCINOMA ?  ?01/13/2014 Tumor Marker  ? Patient's tumor was tested for the following markers: CA125 ?Results of the tumor marker test revealed 227 ?  ?02/07/2014 Tumor Marker  ? Patient's tumor was tested for the following markers: CA125 ?Results of the tumor marker test revealed 130 ?  ?02/15/2014 Genetic Testing  ? Genetics testing from 02-2014 normal (OvaNext panel) ?  ?02/23/2014 Imaging  ? Interval decrease in ascites. 2. Morphologic changes in the liver consistent with cirrhosis. 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 Portal vein remains patent at this time. 3. Persistent but decreased abnormal soft tissue attenuation tracking in the omental fat. This may be secondary to interval improvement in metastatic disease. 4. Peritoneal thickening seen in the left abdomen and pelvis on the previous study has decreased and nearly resolved in the  interval. This also suggests interval improvement in metastatic disease. ?  ?  ?03/07/2014 Procedure  ? Ultrasound and fluoroscopically guided right internal jugular single lumen power port catheter insertion. Tip in the SVC/RA junction. Catheter ready for use. ?  ?03/17/2014 Tumor Marker  ? Patient's tumor was tested for the following markers: CA125 ?Results of the tumor  marker test revealed 45 ?  ?03/29/2014 Pathology Results  ? 1. Omentum, resection for tumor ?- HIGH GRADE SEROUS CARCINOMA, SEE COMMENT. ?2. Ovary and fallopian tube, right ?- HIGH GRADE SEROUS CARCINOMA, SEE

## 2021-05-15 NOTE — Assessment & Plan Note (Signed)
She is taking both pain medicine and gabapentin ?I refilled her prescription today ?

## 2021-05-15 NOTE — Assessment & Plan Note (Signed)
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

## 2021-05-16 LAB — CA 125: Cancer Antigen (CA) 125: 68.1 U/mL — ABNORMAL HIGH (ref 0.0–38.1)

## 2021-05-17 ENCOUNTER — Ambulatory Visit: Payer: Medicare Other

## 2021-06-05 ENCOUNTER — Inpatient Hospital Stay: Payer: Medicare Other

## 2021-06-05 ENCOUNTER — Other Ambulatory Visit: Payer: Self-pay

## 2021-06-05 ENCOUNTER — Inpatient Hospital Stay (HOSPITAL_BASED_OUTPATIENT_CLINIC_OR_DEPARTMENT_OTHER): Payer: Medicare Other | Admitting: Hematology and Oncology

## 2021-06-05 ENCOUNTER — Encounter: Payer: Self-pay | Admitting: Hematology and Oncology

## 2021-06-05 VITALS — BP 129/61 | HR 69 | Temp 97.8°F | Resp 18 | Ht 61.0 in | Wt 175.0 lb

## 2021-06-05 DIAGNOSIS — D696 Thrombocytopenia, unspecified: Secondary | ICD-10-CM | POA: Diagnosis not present

## 2021-06-05 DIAGNOSIS — C482 Malignant neoplasm of peritoneum, unspecified: Secondary | ICD-10-CM | POA: Diagnosis not present

## 2021-06-05 DIAGNOSIS — Z7189 Other specified counseling: Secondary | ICD-10-CM

## 2021-06-05 DIAGNOSIS — G620 Drug-induced polyneuropathy: Secondary | ICD-10-CM | POA: Diagnosis not present

## 2021-06-05 DIAGNOSIS — T451X5A Adverse effect of antineoplastic and immunosuppressive drugs, initial encounter: Secondary | ICD-10-CM | POA: Diagnosis not present

## 2021-06-05 DIAGNOSIS — Z5111 Encounter for antineoplastic chemotherapy: Secondary | ICD-10-CM | POA: Diagnosis not present

## 2021-06-05 LAB — CMP (CANCER CENTER ONLY)
ALT: 19 U/L (ref 0–44)
AST: 18 U/L (ref 15–41)
Albumin: 4.1 g/dL (ref 3.5–5.0)
Alkaline Phosphatase: 86 U/L (ref 38–126)
Anion gap: 7 (ref 5–15)
BUN: 17 mg/dL (ref 8–23)
CO2: 28 mmol/L (ref 22–32)
Calcium: 9.3 mg/dL (ref 8.9–10.3)
Chloride: 107 mmol/L (ref 98–111)
Creatinine: 0.9 mg/dL (ref 0.44–1.00)
GFR, Estimated: >60 mL/min (ref >60)
Glucose, Bld: 93 mg/dL (ref 70–99)
Potassium: 3.6 mmol/L (ref 3.5–5.1)
Sodium: 142 mmol/L (ref 135–145)
Total Bilirubin: 0.6 mg/dL (ref 0.3–1.2)
Total Protein: 7.5 g/dL (ref 6.5–8.1)

## 2021-06-05 LAB — CBC WITH DIFFERENTIAL (CANCER CENTER ONLY)
Abs Immature Granulocytes: 0.02 10*3/uL (ref 0.00–0.07)
Basophils Absolute: 0 10*3/uL (ref 0.0–0.1)
Basophils Relative: 0 %
Eosinophils Absolute: 0 10*3/uL (ref 0.0–0.5)
Eosinophils Relative: 0 %
HCT: 36.8 % (ref 36.0–46.0)
Hemoglobin: 12.3 g/dL (ref 12.0–15.0)
Immature Granulocytes: 0 %
Lymphocytes Relative: 18 %
Lymphs Abs: 1.1 10*3/uL (ref 0.7–4.0)
MCH: 31.2 pg (ref 26.0–34.0)
MCHC: 33.4 g/dL (ref 30.0–36.0)
MCV: 93.4 fL (ref 80.0–100.0)
Monocytes Absolute: 0.4 10*3/uL (ref 0.1–1.0)
Monocytes Relative: 6 %
Neutro Abs: 4.6 10*3/uL (ref 1.7–7.7)
Neutrophils Relative %: 76 %
Platelet Count: 111 10*3/uL — ABNORMAL LOW (ref 150–400)
RBC: 3.94 MIL/uL (ref 3.87–5.11)
RDW: 15.3 % (ref 11.5–15.5)
WBC Count: 6.2 10*3/uL (ref 4.0–10.5)
nRBC: 0 % (ref 0.0–0.2)

## 2021-06-05 MED ORDER — SODIUM CHLORIDE 0.9 % IV SOLN
37.5000 mg/m2 | Freq: Once | INTRAVENOUS | Status: AC
  Administered 2021-06-05: 70 mg via INTRAVENOUS
  Filled 2021-06-05: qty 7

## 2021-06-05 MED ORDER — PEGFILGRASTIM 6 MG/0.6ML ~~LOC~~ PSKT
6.0000 mg | PREFILLED_SYRINGE | Freq: Once | SUBCUTANEOUS | Status: AC
  Administered 2021-06-05: 6 mg via SUBCUTANEOUS
  Filled 2021-06-05: qty 0.6

## 2021-06-05 MED ORDER — SODIUM CHLORIDE 0.9 % IV SOLN
10.0000 mg | Freq: Once | INTRAVENOUS | Status: AC
  Administered 2021-06-05: 10 mg via INTRAVENOUS
  Filled 2021-06-05: qty 10

## 2021-06-05 MED ORDER — HEPARIN SOD (PORK) LOCK FLUSH 100 UNIT/ML IV SOLN
500.0000 [IU] | Freq: Once | INTRAVENOUS | Status: AC | PRN
  Administered 2021-06-05: 500 [IU]

## 2021-06-05 MED ORDER — SODIUM CHLORIDE 0.9% FLUSH
10.0000 mL | Freq: Once | INTRAVENOUS | Status: AC
  Administered 2021-06-05: 10 mL

## 2021-06-05 MED ORDER — SODIUM CHLORIDE 0.9% FLUSH
10.0000 mL | INTRAVENOUS | Status: DC | PRN
  Administered 2021-06-05: 10 mL

## 2021-06-05 MED ORDER — SODIUM CHLORIDE 0.9 % IV SOLN: Freq: Once | INTRAVENOUS | Status: AC

## 2021-06-05 NOTE — Patient Instructions (Signed)
[facility]   ?Discharge Instructions: ?Thank you for choosing [facility] to provide your oncology and hematology care.  ? ?If you have a lab appointment with the [facility], please go directly to the [facility] and check in at the registration area. ?  ?Wear comfortable clothing and clothing appropriate for easy access to any Portacath or PICC line.  ? ?We strive to give you quality time with your provider. You may need to reschedule your appointment if you arrive late (15 or more minutes).  Arriving late affects you and other patients whose appointments are after yours.  Also, if you miss three or more appointments without notifying the office, you may be dismissed from the clinic at the provider?s discretion.    ?  ?For prescription refill requests, have your pharmacy contact our office and allow 72 hours for refills to be completed.   ? ?Today you received the following chemotherapy and/or immunotherapy agents: docetaxel    ?  ?To help prevent nausea and vomiting after your treatment, we encourage you to take your nausea medication as directed. ? ?BELOW ARE SYMPTOMS THAT SHOULD BE REPORTED IMMEDIATELY: ?*FEVER GREATER THAN 100.4 F (38 ?C) OR HIGHER ?*CHILLS OR SWEATING ?*NAUSEA AND VOMITING THAT IS NOT CONTROLLED WITH YOUR NAUSEA MEDICATION ?*UNUSUAL SHORTNESS OF BREATH ?*UNUSUAL BRUISING OR BLEEDING ?*URINARY PROBLEMS (pain or burning when urinating, or frequent urination) ?*BOWEL PROBLEMS (unusual diarrhea, constipation, pain near the anus) ?TENDERNESS IN MOUTH AND THROAT WITH OR WITHOUT PRESENCE OF ULCERS (sore throat, sores in mouth, or a toothache) ?UNUSUAL RASH, SWELLING OR PAIN  ?UNUSUAL VAGINAL DISCHARGE OR ITCHING  ? ?Items with * indicate a potential emergency and should be followed up as soon as possible or go to the Emergency Department if any problems should occur. ? ?Please show the CHEMOTHERAPY ALERT CARD or IMMUNOTHERAPY ALERT CARD at check-in  to the Emergency Department and triage nurse. ? ?Should you have questions after your visit or need to cancel or reschedule your appointment, please contact [facility]  Dept: [phone]  and follow the prompts.  Office hours are 8:00 a.m. to 4:30 p.m. Monday - Friday. Please note that voicemails left after 4:00 p.m. may not be returned until the following business day.  We are closed weekends and major holidays. You have access to a nurse at all times for urgent questions. Please call the main number to the clinic Dept: [phone] and follow the prompts. ? ? ?For any non-urgent questions, you may also contact your provider using MyChart. We now offer e-Visits for anyone 22 and older to request care online for non-urgent symptoms. For details visit [email or web address]. ?  ?Also download the MyChart app! Go to the app store, search "MyChart", open the app, select [facility], and log in with your MyChart username and password. ? ?Due to Covid, a mask is required upon entering the hospital/clinic. If you do not have a mask, one will be given to you upon arrival. For doctor visits, patients may have 1 support person aged 30 or older with them. For treatment visits, patients cannot have anyone with them due to current Covid guidelines and our immunocompromised population.  ? ?

## 2021-06-05 NOTE — Assessment & Plan Note (Signed)
So far, she tolerated treatment well except for persistent pancytopenia from treatment ?She denies worsening peripheral neuropathy ?We will proceed with treatment as scheduled ?Recommend minimum 4 cycles of treatment before repeat CT imaging ?

## 2021-06-05 NOTE — Assessment & Plan Note (Signed)
She is taking both pain medicine and gabapentin ?This is not worse ?Observe closely ?

## 2021-06-05 NOTE — Assessment & Plan Note (Signed)
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

## 2021-06-05 NOTE — Progress Notes (Signed)
[facility] ?OFFICE PROGRESS NOTE ? ?Patient Care Team: ?Jolinda Croak, MD as PCP - General (Family Medicine) ?Ludwig Clarks, DO as Consulting Physician (Neurology) ? ?ASSESSMENT & PLAN:  ?Primary peritoneal carcinomatosis ([place]) ?So far, she tolerated treatment well except for persistent pancytopenia from treatment ?She denies worsening peripheral neuropathy ?We will proceed with treatment as scheduled ?Recommend minimum 4 cycles of treatment before repeat CT imaging ? 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 ?Chemotherapy-induced neuropathy ([place]) ?She is taking both pain medicine and gabapentin ?This is not worse ?Observe closely ? ?Orders Placed This Encounter  ?Procedures  ? CT ABDOMEN PELVIS W CONTRAST  ?  Standing Status:   Future  ?  Standing Expiration Date:   06/06/2022  ?  Order Specific Question:   If indicated for the ordered procedure, I authorize the administration of contrast media per Radiology protocol  ?  Answer:   Yes  ?  Order Specific Question:   Preferred imaging location?  ?  Answer:   [facility]  ?  Order Specific Question:   Radiology Contrast Protocol - do NOT remove file path  ?  Answer:   [email or web address]  ? ? ?All questions were answered. The patient knows to call the clinic with any problems, questions or concerns. ?The total time spent in the appointment was 20 minutes encounter with patients including review of chart and various tests results, discussions about plan of care and coordination of care plan ?  ?[name], MD ?06/05/2021 10:45 AM ? ?INTERVAL HISTORY: ?Please see below for problem oriented charting. ?she returns for treatment follow-up seen prior to cycle 3 of Taxotere ?She tolerated last cycle of  treatment well ?Her peripheral neuropathy and chronic pain is stable ?No bleeding complications from treatment ?Denies abdominal pain or changes in bowel habits ? ?REVIEW OF SYSTEMS:   ?Constitutional: Denies fevers, chills or abnormal weight loss ?Eyes: Denies blurriness of vision ?Ears, nose, mouth, throat, and face: Denies mucositis or sore throat ?Respiratory: Denies cough, dyspnea or wheezes ?Cardiovascular: Denies palpitation, chest discomfort or lower extremity swelling ?Gastrointestinal:  Denies nausea, heartburn or change in bowel habits ?Skin: Denies abnormal skin rashes ?Lymphatics: Denies new lymphadenopathy or easy bruising ?Behavioral/Psych: Mood is stable, no new changes  ?All other systems were reviewed with the patient and are negative. ? ?I have reviewed the past medical history, past surgical history, social history and family history with the patient and they are unchanged from previous note. ? ?ALLERGIES:  is allergic to carboplatin, shellfish allergy, vancomycin, benadryl [diphenhydramine hcl], penicillins, and tape. ? ?MEDICATIONS:  ?Current Outpatient Medications  ?Medication Sig Dispense Refill  ? calcium elemental as carbonate (BARIATRIC TUMS ULTRA) 400 MG chewable tablet Chew 1 tablet by mouth 2 (two) times daily.    ? Cholecalciferol (VITAMIN D) 50 MCG (2000 UT) tablet Take 2,000 Units by mouth at bedtime.    ? dexamethasone (DECADRON) 4 MG tablet Take 1 tablet the day before chemo and daily after chemo for 2 days. Please dispense 18 tabs for all 6 cycles of treatment 18 tablet 0  ? gabapentin (NEURONTIN) 300 MG capsule Take 3 capsules (900 mg total) by mouth 2 (two) times daily. 120 capsule 11  ? HYDROcodone-acetaminophen (NORCO/VICODIN) 5-325 MG tablet Take 1 tablet by mouth every 6 (  six) hours as needed for moderate pain. 60 tablet 0  ? Insulin Degludec 100 UNIT/ML SOLN Inject 30 Units into the skin daily.    ? lidocaine-prilocaine (EMLA) cream Apply to Porta-cath  1-2 hours prior to  access as directed. (Patient taking differently: Apply 1 application topically daily as needed (port access).) 30 g 9  ? ondansetron (ZOFRAN) 8 MG tablet TAKE 1 TABLET(8 MG) BY MOUTH EVERY 8 HOURS AS NEEDED 60 tablet 1  ? pantoprazole (PROTONIX) 40 MG tablet Take 1 tablet (40 mg total) by mouth daily.    ? prochlorperazine (COMPAZINE) 10 MG tablet Take 1 tablet (10 mg total) by mouth every 6 (six) hours as needed (Nausea or vomiting). 30 tablet 1  ? rotigotine (NEUPRO) 4 MG/24HR APPLY 1 PATCH TOPICALLY TO THE SKIN DAILY 90 patch 3  ? SEMAGLUTIDE,0.25 OR 0.5MG/DOS, [place] Inject 0.25 mg into the skin once a week.    ? ?No current facility-administered medications for this visit.  ? ?Facility-Administered Medications Ordered in Other Visits  ?Medication Dose Route Frequency Provider Last Rate Last Admin  ? heparin lock flush 100 unit/mL  500 Units Intracatheter Once PRN Alvy Bimler, Jerami Tammen, MD      ? pegfilgrastim (NEULASTA ONPRO KIT) injection 6 mg  6 mg Subcutaneous Once Alvy Bimler, Dorri Ozturk, MD      ? sodium chloride flush (NS) 0.9 % injection 10 mL  10 mL Intracatheter PRN [name], MD      ? ? 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 Had allergic reaction to carboplatin. Avastin was stopped due to GI hemorrhage ?  ?Primary peritoneal carcinomatosis ([place])  ?09/06/2011 Imaging  ? CT findings consistent with cirrhotic changes involving the liver.  No worrisome liver mass.  There are associated portal venous collaterals and splenomegaly consistent with portal venous hypertension. 2.  No other significant upper abdominal findings ? ?  ?11/08/2013 Imaging  ? US showed ascites ? ?  ?11/08/2013 Initial Diagnosis  ? Patient had ~ 2 months of some urinary incontinence, saw Dr Ronita Hipps in 10-2013, with pelvic mass on exam  ? ?  ?11/12/2013 Imaging  ? There are findings of progressive hepatic cirrhosis with a large amount of ascites and mild  splenomegaly. There are venous collaterals present. 2. There is abnormal thickening of the peritoneal surface along the left mid and lower abdominal wall. There is abnormal soft tissue density in the pelvis as well which could reflect the clinically suspected ovarian malignancy but the findings are nondiagnostic. 3. There is a new small left pleural effusion. 4. There is no acute bowel abnormality. ? ?  ?11/18/2013 Imaging  ? Successful ultrasound guided diagnostic and therapeutic paracentesis yielding 4.1 liters of ascites. ?  ? ?  ?11/18/2013 Pathology Results  ? PERITONEAL/ASCITIC FLUID (SPECIMEN 1 OF 1 COLLECTED 11/18/13): ?SEROUS CARCINOMA, PLEASE SEE COMMENT. ? ?  ?12/01/2013 Tumor Marker  ? Patient's tumor was tested for the following markers: CA125 ?Results of the tumor marker test revealed 1938 ? ?  ?12/03/2013 Imaging  ? Successful ultrasound-guided therapeutic paracentesis yielding 3.6 liters of peritoneal fluid. ? ?  ?12/07/2013 - 05/30/2014 Chemotherapy  ? She received 3 cycles of carboplatin and Taxol, interrupted cycle 4 for surgery.  She subsequently completed 3 more cycles of chemotherapy after surgery ? ?  ?12/20/2013 Imaging  ? Successful ultrasound-guided diagnostic and therapeutic paracentesis yielding 2.2 liters of peritoneal fluid ? ?  ?12/20/2013 Pathology Results  ? PERITONEAL/ASCITIC  FLUID(SPECIMEN 1 OF 1 COLLECTED 12/20/13): ?MALIGNANT CELLS CONSISTENT WITH SEROUS CARCINOMA ? ?  ?01/13/2014 Tumor Marker  ? Patient's tumor was tested for the following markers: CA125 ?Results of the tumor marker test revealed 227 ? ?  ?02/07/2014 Tumor Marker  ? Patient's tumor was tested for the following markers: CA125 ?Results of the tumor marker test revealed 130 ? ?  ?02/15/2014 Genetic Testing  ? Genetics testing from 02-2014 normal (OvaNext panel) ? ?  ?02/23/2014 Imaging  ? Interval decrease in ascites. 2. Morphologic changes in the liver consistent with cirrhosis. Esophageal varices are compatible with  associated portal venous hypertension. Portal vein remains patent at this time. 3. Persistent but decreased abnormal soft tissue attenuation tracking in the omental fat. This may be secondary to interval impr

## 2021-06-07 ENCOUNTER — Other Ambulatory Visit: Payer: Medicare Other

## 2021-06-25 MED FILL — Dexamethasone Sodium Phosphate Inj 100 MG/10ML: INTRAMUSCULAR | Qty: 1 | Status: AC

## 2021-06-26 ENCOUNTER — Inpatient Hospital Stay: Payer: Medicare Other | Attending: Hematology and Oncology

## 2021-06-26 ENCOUNTER — Encounter: Payer: Self-pay | Admitting: Hematology and Oncology

## 2021-06-26 ENCOUNTER — Inpatient Hospital Stay: Payer: Medicare Other

## 2021-06-26 ENCOUNTER — Other Ambulatory Visit: Payer: Self-pay

## 2021-06-26 ENCOUNTER — Inpatient Hospital Stay (HOSPITAL_BASED_OUTPATIENT_CLINIC_OR_DEPARTMENT_OTHER): Payer: Medicare Other | Admitting: Hematology and Oncology

## 2021-06-26 DIAGNOSIS — E1140 Type 2 diabetes mellitus with diabetic neuropathy, unspecified: Secondary | ICD-10-CM

## 2021-06-26 DIAGNOSIS — D61818 Other pancytopenia: Secondary | ICD-10-CM

## 2021-06-26 DIAGNOSIS — C482 Malignant neoplasm of peritoneum, unspecified: Secondary | ICD-10-CM | POA: Insufficient documentation

## 2021-06-26 DIAGNOSIS — Z7189 Other specified counseling: Secondary | ICD-10-CM

## 2021-06-26 DIAGNOSIS — Z794 Long term (current) use of insulin: Secondary | ICD-10-CM

## 2021-06-26 DIAGNOSIS — Z5111 Encounter for antineoplastic chemotherapy: Secondary | ICD-10-CM | POA: Diagnosis present

## 2021-06-26 DIAGNOSIS — R161 Splenomegaly, not elsewhere classified: Secondary | ICD-10-CM | POA: Insufficient documentation

## 2021-06-26 DIAGNOSIS — D509 Iron deficiency anemia, unspecified: Secondary | ICD-10-CM

## 2021-06-26 LAB — CMP (CANCER CENTER ONLY)
ALT: 19 U/L (ref 0–44)
AST: 19 U/L (ref 15–41)
Albumin: 4 g/dL (ref 3.5–5.0)
Alkaline Phosphatase: 83 U/L (ref 38–126)
Anion gap: 6 (ref 5–15)
BUN: 17 mg/dL (ref 8–23)
CO2: 27 mmol/L (ref 22–32)
Calcium: 9 mg/dL (ref 8.9–10.3)
Chloride: 109 mmol/L (ref 98–111)
Creatinine: 1.12 mg/dL — ABNORMAL HIGH (ref 0.44–1.00)
GFR, Estimated: 52 mL/min — ABNORMAL LOW (ref >60)
Glucose, Bld: 96 mg/dL (ref 70–99)
Potassium: 3.8 mmol/L (ref 3.5–5.1)
Sodium: 142 mmol/L (ref 135–145)
Total Bilirubin: 0.7 mg/dL (ref 0.3–1.2)
Total Protein: 7.1 g/dL (ref 6.5–8.1)

## 2021-06-26 LAB — CBC WITH DIFFERENTIAL (CANCER CENTER ONLY)
Abs Immature Granulocytes: 0.02 10*3/uL (ref 0.00–0.07)
Basophils Absolute: 0 10*3/uL (ref 0.0–0.1)
Basophils Relative: 1 %
Eosinophils Absolute: 0 10*3/uL (ref 0.0–0.5)
Eosinophils Relative: 0 %
HCT: 34.8 % — ABNORMAL LOW (ref 36.0–46.0)
Hemoglobin: 11.6 g/dL — ABNORMAL LOW (ref 12.0–15.0)
Immature Granulocytes: 0 %
Lymphocytes Relative: 21 %
Lymphs Abs: 1.1 10*3/uL (ref 0.7–4.0)
MCH: 31.6 pg (ref 26.0–34.0)
MCHC: 33.3 g/dL (ref 30.0–36.0)
MCV: 94.8 fL (ref 80.0–100.0)
Monocytes Absolute: 0.4 10*3/uL (ref 0.1–1.0)
Monocytes Relative: 7 %
Neutro Abs: 3.7 10*3/uL (ref 1.7–7.7)
Neutrophils Relative %: 71 %
Platelet Count: 97 10*3/uL — ABNORMAL LOW (ref 150–400)
RBC: 3.67 MIL/uL — ABNORMAL LOW (ref 3.87–5.11)
RDW: 15.9 % — ABNORMAL HIGH (ref 11.5–15.5)
WBC Count: 5.3 10*3/uL (ref 4.0–10.5)
nRBC: 0 % (ref 0.0–0.2)

## 2021-06-26 LAB — FERRITIN: Ferritin: 92 ng/mL (ref 11–307)

## 2021-06-26 MED ORDER — HEPARIN SOD (PORK) LOCK FLUSH 100 UNIT/ML IV SOLN
500.0000 [IU] | Freq: Once | INTRAVENOUS | Status: AC | PRN
  Administered 2021-06-26: 500 [IU]

## 2021-06-26 MED ORDER — SODIUM CHLORIDE 0.9% FLUSH
10.0000 mL | INTRAVENOUS | Status: DC | PRN
  Administered 2021-06-26: 10 mL

## 2021-06-26 MED ORDER — SODIUM CHLORIDE 0.9 % IV SOLN
37.5000 mg/m2 | Freq: Once | INTRAVENOUS | Status: AC
  Administered 2021-06-26: 70 mg via INTRAVENOUS
  Filled 2021-06-26: qty 7

## 2021-06-26 MED ORDER — SODIUM CHLORIDE 0.9 % IV SOLN
10.0000 mg | Freq: Once | INTRAVENOUS | Status: AC
  Administered 2021-06-26: 10 mg via INTRAVENOUS
  Filled 2021-06-26: qty 10

## 2021-06-26 MED ORDER — SODIUM CHLORIDE 0.9 % IV SOLN: Freq: Once | INTRAVENOUS | Status: AC

## 2021-06-26 MED ORDER — PEGFILGRASTIM 6 MG/0.6ML ~~LOC~~ PSKT
6.0000 mg | PREFILLED_SYRINGE | Freq: Once | SUBCUTANEOUS | Status: AC
  Administered 2021-06-26: 6 mg via SUBCUTANEOUS
  Filled 2021-06-26: qty 0.6

## 2021-06-26 MED ORDER — SODIUM CHLORIDE 0.9% FLUSH
10.0000 mL | Freq: Once | INTRAVENOUS | Status: AC
  Administered 2021-06-26: 10 mL

## 2021-06-26 NOTE — Assessment & Plan Note (Signed)
She denies recent severe hyperglycemia and worsening neuropathy with treatment ?We will continue treatment as scheduled ?

## 2021-06-26 NOTE — Progress Notes (Signed)
[facility] ?OFFICE PROGRESS NOTE ? ?Patient Care Team: ?Jolinda Croak, MD as PCP - General (Family Medicine) ?Ludwig Clarks, DO as Consulting Physician (Neurology) ? ?ASSESSMENT & PLAN:  ?Primary peritoneal carcinomatosis ([place]) ?So far, she tolerated treatment well except for persistent pancytopenia from treatment ?She denies worsening peripheral neuropathy ?We will proceed with treatment as scheduled ?Recommend minimum 4 cycles of treatment before repeat CT imaging ? 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 ?Diabetes mellitus with diabetic neuropathy, with long-term current use of insulin ([place]) ?She denies recent severe hyperglycemia and worsening neuropathy with treatment ?We will continue treatment as scheduled ? ?No orders of the defined types were placed in this encounter. ? ? ?All questions were answered. The patient knows to call the clinic with any problems, questions or concerns. ?The total time spent in the appointment was 20 minutes encounter with patients including review of chart and various tests results, discussions about plan of care and coordination of care plan ?  ?[name], MD ?06/26/2021 11:00 AM ? ?INTERVAL HISTORY: ?Please see below for problem oriented charting. 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 ?REVIEW OF SYSTEMS:   ?Constitutional: Denies fevers, chills or abnormal weight loss ?Eyes: Denies blurriness of vision ?Ears, nose, mouth, throat, and face: Denies mucositis or sore throat ?Respiratory: Denies cough, dyspnea or wheezes ?Cardiovascular: Denies palpitation, chest discomfort  or lower extremity swelling ?Gastrointestinal:  Denies nausea, heartburn or change in bowel habits ?Skin: Denies abnormal skin rashes ?Lymphatics: Denies new lymphadenopathy or easy bruising ?Behavioral/Psych: Mood is stable, no new changes  ?All other systems were reviewed with the patient and are negative. ? ?I have reviewed the past medical history, past surgical history, social history and family history with the patient and they are unchanged from previous note. ? ?ALLERGIES:  is allergic to carboplatin, shellfish allergy, vancomycin, benadryl [diphenhydramine hcl], penicillins, and tape. ? ?MEDICATIONS:  ?Current Outpatient Medications  ?Medication Sig Dispense Refill  ? calcium elemental as carbonate (BARIATRIC TUMS ULTRA) 400 MG chewable tablet Chew 1 tablet by mouth 2 (two) times daily.    ? Cholecalciferol (VITAMIN D) 50 MCG (2000 UT) tablet Take 2,000 Units by mouth at bedtime.    ? dexamethasone (DECADRON) 4 MG tablet Take 1 tablet the day before chemo and daily after chemo for 2 days. Please dispense 18 tabs for all 6 cycles of treatment 18 tablet 0  ? gabapentin (NEURONTIN) 300 MG capsule Take 3 capsules (900 mg total) by mouth 2 (two) times daily. 120 capsule 11  ? HYDROcodone-acetaminophen (NORCO/VICODIN) 5-325 MG tablet Take 1 tablet by mouth every 6 (six) hours as needed for moderate pain. 60 tablet 0  ? Insulin Degludec 100 UNIT/ML SOLN Inject 30 Units into the skin daily.    ? lidocaine-prilocaine (EMLA) cream Apply to Porta-cath  1-2 hours prior to access as directed. (Patient taking differently: Apply 1 application topically daily as needed (port access).) 30 g 9  ? ondansetron (ZOFRAN) 8 MG tablet TAKE 1 TABLET(8 MG) BY MOUTH EVERY 8 HOURS AS NEEDED 60 tablet 1  ? pantoprazole (PROTONIX) 40 MG tablet Take 1 tablet (40 mg total) by mouth daily.    ?  prochlorperazine (COMPAZINE) 10 MG tablet Take 1 tablet (10 mg total) by mouth every 6 (six) hours as needed (Nausea or vomiting). 30 tablet 1  ?  rotigotine (NEUPRO) 4 MG/24HR APPLY 1 PATCH TOPICALLY TO THE SKIN DAILY 90 patch 3  ? SEMAGLUTIDE,0.25 OR 0.5MG/DOS, [place] Inject 0.25 mg into the skin once a week.    ? ?No current facility-administered medications for this visit.  ? ?Facility-Administered Medications Ordered in Other Visits  ?Medication Dose Route Frequency Provider Last Rate Last Admin  ? DOCEtaxel (TAXOTERE) 70 mg in sodium chloride 0.9 % 150 mL chemo infusion  37.5 mg/m2 (Treatment Plan Recorded) Intravenous Once Alvy Bimler, Delories Mauri, MD 157 mL/hr at 06/26/21 1014 70 mg at 06/26/21 1014  ? heparin lock flush 100 unit/mL  500 Units Intracatheter Once PRN Alvy Bimler, Roch Quach, MD      ? pegfilgrastim (NEULASTA ONPRO KIT) injection 6 mg  6 mg Subcutaneous Once Alvy Bimler, Dandrea Widdowson, MD      ? sodium chloride flush (NS) 0.9 % injection 10 mL  10 mL Intracatheter PRN [name], MD      ? ? 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 Had allergic reaction to carboplatin. Avastin was stopped due to GI hemorrhage ?  ?Primary peritoneal carcinomatosis ([place])  ?09/06/2011 Imaging  ? CT findings consistent with cirrhotic changes involving the liver.  No worrisome liver mass.  There are associated portal venous collaterals and splenomegaly consistent with portal venous hypertension. 2.  No other significant upper abdominal findings ? ?  ?11/08/2013 Imaging  ? US showed ascites ? ?  ?11/08/2013 Initial Diagnosis  ? Patient had ~ 2 months of some urinary incontinence, saw Dr Ronita Hipps in 10-2013, with pelvic mass on exam  ? ?  ?11/12/2013 Imaging  ? There are findings of progressive hepatic cirrhosis with a large amount of ascites and mild splenomegaly. There are venous collaterals present. 2. There is abnormal thickening of the peritoneal surface along the left mid and lower abdominal wall. There is abnormal soft tissue density in the pelvis as well which could reflect the clinically  suspected ovarian malignancy but the findings are nondiagnostic. 3. There is a new small left pleural effusion. 4. There is no acute bowel abnormality. ? ?  ?11/18/2013 Imaging  ? Successful ultrasound guided diagnostic and therapeutic paracentesis yielding 4.1 liters of ascites. ?  ? ?  ?11/18/2013 Pathology Results  ? PERITONEAL/ASCITIC FLUID (SPECIMEN 1 OF 1 COLLECTED 11/18/13): ?SEROUS CARCINOMA, PLEASE SEE COMMENT. ? ?  ?12/01/2013 Tumor Marker  ? Patient's tumor was tested for the following markers: CA125 ?Results of the tumor marker test revealed 1936 ? ?  ?12/03/2013 Imaging  ? Successful ultrasound-guided therapeutic paracentesis yielding 3.6 liters of peritoneal fluid. ? ?  ?12/07/2013 - 05/30/2014 Chemotherapy  ? She received 3 cycles of carboplatin and Taxol, interrupted cycle 4 for surgery.  She subsequently completed 3 more cycles of chemotherapy after surgery ? ?  ?12/20/2013 Imaging  ? Successful ultrasound-guided diagnostic and therapeutic paracentesis yielding 2.2 liters of peritoneal fluid ? ?  ?12/20/2013 Pathology Results  ? PERITONEAL/ASCITIC FLUID(SPECIMEN 1 OF 1 COLLECTED 12/20/13): ?MALIGNANT CELLS CONSISTENT WITH SEROUS CARCINOMA ? ?  ?01/13/2014 Tumor Marker  ? Patient's tumor was tested for the following markers: CA125 ?Results of the tumor marker test revealed 227 ? ?  ?02/07/2014 Tumor Marker  ? Patient's tumor was tested for the following markers: CA125 ?Results of the tumor marker test revealed 130 ? ?  ?  02/15/2014 Genetic Testing  ? Genetics testing from 02-2014 normal ([employer] panel) ? ?  ?02/23/2014 Imaging  ? Interval decrease in ascites. 2. Morphologic changes in the liver consistent with cirrhosis. 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 Portal vein remains patent at this time. 3. Persistent but decreased abnormal soft tissue attenuation tracking in the omental fat. This may be secondary to interval improvement in metastatic disease. 4. Peritoneal  thickening seen in the left abdomen and pelvis on the previous study has decreased and nearly resolved in the interval. This also suggests interval improvement in metastatic disease. ?  ? ?  ?03/07/2014 Procedure

## 2021-06-26 NOTE — Assessment & Plan Note (Signed)
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

## 2021-06-26 NOTE — Assessment & Plan Note (Signed)
So far, she tolerated treatment well except for persistent pancytopenia from treatment ?She denies worsening peripheral neuropathy ?We will proceed with treatment as scheduled ?Recommend minimum 4 cycles of treatment before repeat CT imaging ?

## 2021-06-27 LAB — CA 125: Cancer Antigen (CA) 125: 52 U/mL — ABNORMAL HIGH (ref 0.0–38.1)

## 2021-07-13 ENCOUNTER — Other Ambulatory Visit: Payer: Self-pay

## 2021-07-13 ENCOUNTER — Inpatient Hospital Stay: Payer: Medicare Other | Attending: Hematology and Oncology

## 2021-07-13 ENCOUNTER — Ambulatory Visit (HOSPITAL_COMMUNITY)
Admission: RE | Admit: 2021-07-13 | Discharge: 2021-07-13 | Disposition: A | Discharge status: Home / Self Care | Payer: Medicare Other | Source: Ambulatory Visit | Attending: Hematology and Oncology | Admitting: Hematology and Oncology

## 2021-07-13 DIAGNOSIS — D61818 Other pancytopenia: Secondary | ICD-10-CM | POA: Insufficient documentation

## 2021-07-13 DIAGNOSIS — Z5189 Encounter for other specified aftercare: Secondary | ICD-10-CM | POA: Insufficient documentation

## 2021-07-13 DIAGNOSIS — C563 Malignant neoplasm of bilateral ovaries: Secondary | ICD-10-CM | POA: Insufficient documentation

## 2021-07-13 DIAGNOSIS — C482 Malignant neoplasm of peritoneum, unspecified: Secondary | ICD-10-CM

## 2021-07-13 DIAGNOSIS — Z90722 Acquired absence of ovaries, bilateral: Secondary | ICD-10-CM | POA: Insufficient documentation

## 2021-07-13 DIAGNOSIS — C786 Secondary malignant neoplasm of retroperitoneum and peritoneum: Secondary | ICD-10-CM | POA: Insufficient documentation

## 2021-07-13 DIAGNOSIS — Z5111 Encounter for antineoplastic chemotherapy: Secondary | ICD-10-CM | POA: Insufficient documentation

## 2021-07-13 DIAGNOSIS — Z9079 Acquired absence of other genital organ(s): Secondary | ICD-10-CM | POA: Insufficient documentation

## 2021-07-13 DIAGNOSIS — T451X5A Adverse effect of antineoplastic and immunosuppressive drugs, initial encounter: Secondary | ICD-10-CM | POA: Insufficient documentation

## 2021-07-13 DIAGNOSIS — G620 Drug-induced polyneuropathy: Secondary | ICD-10-CM | POA: Insufficient documentation

## 2021-07-13 DIAGNOSIS — Z79899 Other long term (current) drug therapy: Secondary | ICD-10-CM | POA: Insufficient documentation

## 2021-07-13 DIAGNOSIS — R161 Splenomegaly, not elsewhere classified: Secondary | ICD-10-CM | POA: Insufficient documentation

## 2021-07-13 DIAGNOSIS — E1140 Type 2 diabetes mellitus with diabetic neuropathy, unspecified: Secondary | ICD-10-CM | POA: Insufficient documentation

## 2021-07-13 DIAGNOSIS — Z7189 Other specified counseling: Secondary | ICD-10-CM

## 2021-07-13 DIAGNOSIS — Z7952 Long term (current) use of systemic steroids: Secondary | ICD-10-CM | POA: Insufficient documentation

## 2021-07-13 DIAGNOSIS — Z794 Long term (current) use of insulin: Secondary | ICD-10-CM | POA: Insufficient documentation

## 2021-07-13 LAB — CBC WITH DIFFERENTIAL (CANCER CENTER ONLY)
Abs Immature Granulocytes: 0.02 10*3/uL (ref 0.00–0.07)
Basophils Absolute: 0 10*3/uL (ref 0.0–0.1)
Basophils Relative: 1 %
Eosinophils Absolute: 0 10*3/uL (ref 0.0–0.5)
Eosinophils Relative: 0 %
HCT: 34.7 % — ABNORMAL LOW (ref 36.0–46.0)
Hemoglobin: 11.8 g/dL — ABNORMAL LOW (ref 12.0–15.0)
Immature Granulocytes: 0 %
Lymphocytes Relative: 16 %
Lymphs Abs: 0.8 10*3/uL (ref 0.7–4.0)
MCH: 32 pg (ref 26.0–34.0)
MCHC: 34 g/dL (ref 30.0–36.0)
MCV: 94 fL (ref 80.0–100.0)
Monocytes Absolute: 0.3 10*3/uL (ref 0.1–1.0)
Monocytes Relative: 6 %
Neutro Abs: 3.7 10*3/uL (ref 1.7–7.7)
Neutrophils Relative %: 77 %
Platelet Count: 85 10*3/uL — ABNORMAL LOW (ref 150–400)
RBC: 3.69 MIL/uL — ABNORMAL LOW (ref 3.87–5.11)
RDW: 15.5 % (ref 11.5–15.5)
WBC Count: 4.8 10*3/uL (ref 4.0–10.5)
nRBC: 0 % (ref 0.0–0.2)

## 2021-07-13 LAB — CMP (CANCER CENTER ONLY)
ALT: 17 U/L (ref 0–44)
AST: 19 U/L (ref 15–41)
Albumin: 4 g/dL (ref 3.5–5.0)
Alkaline Phosphatase: 86 U/L (ref 38–126)
Anion gap: 6 (ref 5–15)
BUN: 17 mg/dL (ref 8–23)
CO2: 25 mmol/L (ref 22–32)
Calcium: 9.1 mg/dL (ref 8.9–10.3)
Chloride: 110 mmol/L (ref 98–111)
Creatinine: 0.78 mg/dL (ref 0.44–1.00)
GFR, Estimated: >60 mL/min (ref >60)
Glucose, Bld: 90 mg/dL (ref 70–99)
Potassium: 4 mmol/L (ref 3.5–5.1)
Sodium: 141 mmol/L (ref 135–145)
Total Bilirubin: 0.5 mg/dL (ref 0.3–1.2)
Total Protein: 6.9 g/dL (ref 6.5–8.1)

## 2021-07-13 MED ORDER — HEPARIN SOD (PORK) LOCK FLUSH 100 UNIT/ML IV SOLN
INTRAVENOUS | Status: AC
  Filled 2021-07-13: qty 5

## 2021-07-13 MED ORDER — SODIUM CHLORIDE (PF) 0.9 % IJ SOLN
INTRAMUSCULAR | Status: AC
  Filled 2021-07-13: qty 50

## 2021-07-13 MED ORDER — HEPARIN SOD (PORK) LOCK FLUSH 100 UNIT/ML IV SOLN
500.0000 [IU] | Freq: Once | INTRAVENOUS | Status: AC
  Administered 2021-07-13: 500 [IU] via INTRAVENOUS

## 2021-07-13 MED ORDER — SODIUM CHLORIDE 0.9% FLUSH
10.0000 mL | Freq: Once | INTRAVENOUS | Status: AC
  Administered 2021-07-13: 10 mL

## 2021-07-13 MED ORDER — IOHEXOL 9 MG/ML PO SOLN: 500.0000 mL | ORAL | Status: AC

## 2021-07-13 MED ORDER — IOHEXOL 300 MG/ML  SOLN
100.0000 mL | Freq: Once | INTRAMUSCULAR | Status: AC | PRN
  Administered 2021-07-13: 100 mL via INTRAVENOUS

## 2021-07-16 ENCOUNTER — Encounter (HOSPITAL_COMMUNITY): Payer: Self-pay | Admitting: Gastroenterology

## 2021-07-16 MED FILL — Dexamethasone Sodium Phosphate Inj 100 MG/10ML: INTRAMUSCULAR | Qty: 1 | Status: AC

## 2021-07-16 NOTE — Progress Notes (Signed)
Attempted to obtain medical history via telephone, unable to reach at this time. HIPAA compliant voicemail message left requesting return call to pre surgical testing department. 

## 2021-07-17 ENCOUNTER — Inpatient Hospital Stay (HOSPITAL_BASED_OUTPATIENT_CLINIC_OR_DEPARTMENT_OTHER): Payer: Medicare Other | Admitting: Hematology and Oncology

## 2021-07-17 ENCOUNTER — Inpatient Hospital Stay: Payer: Medicare Other

## 2021-07-17 ENCOUNTER — Encounter: Payer: Self-pay | Admitting: Hematology and Oncology

## 2021-07-17 ENCOUNTER — Other Ambulatory Visit: Payer: Self-pay

## 2021-07-17 VITALS — BP 131/67

## 2021-07-17 DIAGNOSIS — T451X5A Adverse effect of antineoplastic and immunosuppressive drugs, initial encounter: Secondary | ICD-10-CM | POA: Diagnosis not present

## 2021-07-17 DIAGNOSIS — Z79899 Other long term (current) drug therapy: Secondary | ICD-10-CM | POA: Diagnosis not present

## 2021-07-17 DIAGNOSIS — Z7189 Other specified counseling: Secondary | ICD-10-CM

## 2021-07-17 DIAGNOSIS — Z5111 Encounter for antineoplastic chemotherapy: Secondary | ICD-10-CM | POA: Diagnosis present

## 2021-07-17 DIAGNOSIS — E1140 Type 2 diabetes mellitus with diabetic neuropathy, unspecified: Secondary | ICD-10-CM

## 2021-07-17 DIAGNOSIS — C482 Malignant neoplasm of peritoneum, unspecified: Secondary | ICD-10-CM

## 2021-07-17 DIAGNOSIS — Z794 Long term (current) use of insulin: Secondary | ICD-10-CM

## 2021-07-17 DIAGNOSIS — D61818 Other pancytopenia: Secondary | ICD-10-CM

## 2021-07-17 DIAGNOSIS — C786 Secondary malignant neoplasm of retroperitoneum and peritoneum: Secondary | ICD-10-CM | POA: Diagnosis not present

## 2021-07-17 DIAGNOSIS — R161 Splenomegaly, not elsewhere classified: Secondary | ICD-10-CM | POA: Diagnosis not present

## 2021-07-17 DIAGNOSIS — G620 Drug-induced polyneuropathy: Secondary | ICD-10-CM

## 2021-07-17 DIAGNOSIS — Z90722 Acquired absence of ovaries, bilateral: Secondary | ICD-10-CM | POA: Diagnosis not present

## 2021-07-17 DIAGNOSIS — C563 Malignant neoplasm of bilateral ovaries: Secondary | ICD-10-CM | POA: Diagnosis present

## 2021-07-17 DIAGNOSIS — Z9079 Acquired absence of other genital organ(s): Secondary | ICD-10-CM | POA: Diagnosis not present

## 2021-07-17 DIAGNOSIS — Z5189 Encounter for other specified aftercare: Secondary | ICD-10-CM | POA: Diagnosis not present

## 2021-07-17 DIAGNOSIS — Z7952 Long term (current) use of systemic steroids: Secondary | ICD-10-CM | POA: Diagnosis not present

## 2021-07-17 MED ORDER — SODIUM CHLORIDE 0.9 % IV SOLN
10.0000 mg | Freq: Once | INTRAVENOUS | Status: AC
  Administered 2021-07-17: 10 mg via INTRAVENOUS
  Filled 2021-07-17: qty 10

## 2021-07-17 MED ORDER — SODIUM CHLORIDE 0.9 % IV SOLN: Freq: Once | INTRAVENOUS | Status: AC

## 2021-07-17 MED ORDER — HYDROCODONE-ACETAMINOPHEN 5-325 MG PO TABS: 1.0000 | ORAL_TABLET | Freq: Four times a day (QID) | ORAL | 0 refills | Status: DC | PRN

## 2021-07-17 MED ORDER — HEPARIN SOD (PORK) LOCK FLUSH 100 UNIT/ML IV SOLN
500.0000 [IU] | Freq: Once | INTRAVENOUS | Status: AC | PRN
  Administered 2021-07-17: 500 [IU]

## 2021-07-17 MED ORDER — PEGFILGRASTIM 6 MG/0.6ML ~~LOC~~ PSKT
6.0000 mg | PREFILLED_SYRINGE | Freq: Once | SUBCUTANEOUS | Status: AC
  Administered 2021-07-17: 6 mg via SUBCUTANEOUS
  Filled 2021-07-17: qty 0.6

## 2021-07-17 MED ORDER — SODIUM CHLORIDE 0.9 % IV SOLN
37.5000 mg/m2 | Freq: Once | INTRAVENOUS | Status: AC
  Administered 2021-07-17: 70 mg via INTRAVENOUS
  Filled 2021-07-17: qty 7

## 2021-07-17 MED ORDER — DEXAMETHASONE 4 MG PO TABS: ORAL_TABLET | ORAL | 1 refills | Status: DC

## 2021-07-17 MED ORDER — SODIUM CHLORIDE 0.9% FLUSH
10.0000 mL | INTRAVENOUS | Status: DC | PRN
  Administered 2021-07-17: 10 mL

## 2021-07-17 NOTE — Assessment & Plan Note (Signed)
She denies recent severe hyperglycemia and worsening neuropathy with treatment We will continue treatment as scheduled

## 2021-07-17 NOTE — Assessment & Plan Note (Signed)
So far, she tolerated treatment well except for persistent pancytopenia from treatment She denies worsening peripheral neuropathy Recent imaging study early June showed positive response to treatment We will proceed with treatment as scheduled Recommend minimum 4 cycles of treatment before repeat CT imaging again in September

## 2021-07-17 NOTE — Patient Instructions (Signed)
[facility]   Discharge Instructions: Thank you for choosing [facility] to provide your oncology and hematology care.   If you have a lab appointment with the [facility], please go directly to the [facility] and check in at the registration area.   Wear comfortable clothing and clothing appropriate for easy access to any Portacath or PICC line.   We strive to give you quality time with your provider. You may need to reschedule your appointment if you arrive late (15 or more minutes).  Arriving late affects you and other patients whose appointments are after yours.  Also, if you miss three or more appointments without notifying the office, you may be dismissed from the clinic at the provider's discretion.      For prescription refill requests, have your pharmacy contact our office and allow 72 hours for refills to be completed.    Today you received the following chemotherapy and/or immunotherapy agents: docetaxel (and neulasta onpro)   To help prevent nausea and vomiting after your treatment, we encourage you to take your nausea medication as directed.  BELOW ARE SYMPTOMS THAT SHOULD BE REPORTED IMMEDIATELY: *FEVER GREATER THAN 100.4 F (38 C) OR HIGHER *CHILLS OR SWEATING *NAUSEA AND VOMITING THAT IS NOT CONTROLLED WITH YOUR NAUSEA MEDICATION *UNUSUAL SHORTNESS OF BREATH *UNUSUAL BRUISING OR BLEEDING *URINARY PROBLEMS (pain or burning when urinating, or frequent urination) *BOWEL PROBLEMS (unusual diarrhea, constipation, pain near the anus) TENDERNESS IN MOUTH AND THROAT WITH OR WITHOUT PRESENCE OF ULCERS (sore throat, sores in mouth, or a toothache) UNUSUAL RASH, SWELLING OR PAIN  UNUSUAL VAGINAL DISCHARGE OR ITCHING   Items with * indicate a potential emergency and should be followed up as soon as possible or go to the Emergency Department if any problems should occur.  Please show the CHEMOTHERAPY ALERT CARD or IMMUNOTHERAPY ALERT  CARD at check-in to the Emergency Department and triage nurse.  Should you have questions after your visit or need to cancel or reschedule your appointment, please contact [facility]  Dept: [phone]  and follow the prompts.  Office hours are 8:00 a.m. to 4:30 p.m. Monday - Friday. Please note that voicemails left after 4:00 p.m. may not be returned until the following business day.  We are closed weekends and major holidays. You have access to a nurse at all times for urgent questions. Please call the main number to the clinic Dept: [phone] and follow the prompts.   For any non-urgent questions, you may also contact your provider using MyChart. We now offer e-Visits for anyone 72 and older to request care online for non-urgent symptoms. For details visit [email or web address].   Also download the MyChart app! Go to the app store, search "MyChart", open the app, select [facility], and log in with your MyChart username and password.  Due to Covid, a mask is required upon entering the hospital/clinic. If you do not have a mask, one will be given to you upon arrival. For doctor visits, patients may have 1 support person aged 39 or older with them. For treatment visits, patients cannot have anyone with them due to current Covid guidelines and our immunocompromised population.

## 2021-07-17 NOTE — Assessment & Plan Note (Addendum)
She is taking both pain medicine and gabapentin This is not worse Observe closely

## 2021-07-17 NOTE — Progress Notes (Signed)
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  Her mild anemia is likely due to chemotherapy  Diabetes mellitus with diabetic neuropathy, with long-term current use of insulin ([place]) She denies recent severe hyperglycemia and worsening neuropathy with treatment We will continue treatment as scheduled  Orders Placed This Encounter  Procedures   CA 125    Standing Status:   Standing    Number of Occurrences:   11    Standing Expiration Date:   07/18/2022    All questions were answered. The patient knows to call the clinic with any problems, questions or concerns. The total time spent in the appointment was 30 minutes encounter with patients including review of chart and various tests results, discussions about plan of care and coordination of care plan   [name], MD 07/17/2021 10:59 AM  INTERVAL HISTORY: Please see below for problem oriented charting. she returns for treatment follow-up and review of CT imaging She is doing well Denies recent side effects of  Taxotere No worsening neuropathy No recent GI bleed  REVIEW OF SYSTEMS:   Constitutional: Denies fevers, chills or abnormal weight loss Eyes: Denies blurriness of vision Ears, nose, mouth, throat, and face: Denies mucositis or sore throat Respiratory: Denies cough, dyspnea or wheezes Cardiovascular: Denies palpitation, chest discomfort or lower extremity swelling Gastrointestinal:  Denies nausea, heartburn or change in bowel habits Skin: Denies abnormal skin rashes Behavioral/Psych: Mood is stable, no new changes  All other systems were reviewed with the patient and are negative.  I have reviewed the past medical history, past surgical history, social history and family history with the patient and they are unchanged from previous note.  ALLERGIES:  is allergic to carboplatin, shellfish allergy, vancomycin, benadryl [diphenhydramine hcl], penicillins, and tape.  MEDICATIONS:  Current Outpatient Medications  Medication Sig Dispense Refill   calcium elemental as carbonate (BARIATRIC TUMS ULTRA) 400 MG chewable tablet Chew 1 tablet by mouth 2 (two) times daily.     Cholecalciferol (VITAMIN D) 50 MCG (2000 UT) tablet Take 2,000 Units by mouth at bedtime.     dexamethasone (DECADRON) 4 MG tablet Take 1 tablet the day before chemo and daily after chemo for 2 days. Please dispense 18 tabs for all 6 cycles of treatment 18 tablet 1   gabapentin (NEURONTIN) 300 MG capsule Take 3 capsules (900 mg total) by mouth 2 (two) times daily. 120 capsule 11   HYDROcodone-acetaminophen (NORCO/VICODIN) 5-325 MG tablet Take 1 tablet by mouth every 6 (six) hours as needed for moderate pain. 60 tablet 0   Insulin Degludec 100 UNIT/ML SOLN Inject 30 Units into the skin daily.     lidocaine-prilocaine (EMLA) cream Apply  to Porta-cath  1-2 hours prior to access as directed. (Patient taking differently: Apply 1 application topically daily as needed (port access).) 30 g 9   ondansetron (ZOFRAN) 8 MG tablet TAKE 1  TABLET(8 MG) BY MOUTH EVERY 8 HOURS AS NEEDED 60 tablet 1   pantoprazole (PROTONIX) 40 MG tablet Take 1 tablet (40 mg total) by mouth daily.     prochlorperazine (COMPAZINE) 10 MG tablet Take 1 tablet (10 mg total) by mouth every 6 (six) hours as needed (Nausea or vomiting). 30 tablet 1   rotigotine (NEUPRO) 4 MG/24HR APPLY 1 PATCH TOPICALLY TO THE SKIN DAILY 90 patch 3   SEMAGLUTIDE,0.25 OR 0.5MG/DOS, [place] Inject 0.25 mg into the skin once a week.     No current facility-administered medications for this visit.   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 Had allergic reaction to carboplatin. Avastin was stopped due to GI hemorrhage   Primary peritoneal carcinomatosis ([place])  09/06/2011 Imaging   CT findings consistent with cirrhotic changes involving the liver.  No worrisome liver mass.  There are associated portal venous collaterals and splenomegaly consistent with portal venous hypertension. 2.  No other significant upper abdominal findings    11/08/2013 Imaging   US showed ascites    11/08/2013 Initial Diagnosis   Patient had ~ 2 months of some urinary incontinence, saw Dr Ronita Hipps in 10-2013, with pelvic mass on exam     11/12/2013 Imaging   There are findings of progressive hepatic cirrhosis with a large amount of ascites and mild splenomegaly. There are venous collaterals present. 2. There is abnormal thickening of the peritoneal surface along the left mid and lower abdominal wall. There  is abnormal soft tissue density in the pelvis as well which could reflect the clinically suspected ovarian malignancy but the findings are nondiagnostic. 3. There is a new small left pleural effusion. 4. There is no acute bowel abnormality.    11/18/2013 Imaging   Successful ultrasound guided diagnostic and therapeutic paracentesis yielding 4.1 liters of ascites.      11/18/2013 Pathology Results   PERITONEAL/ASCITIC FLUID (SPECIMEN 1 OF 1 COLLECTED 11/18/13): SEROUS CARCINOMA, PLEASE SEE COMMENT.    12/01/2013 Tumor Marker   Patient's tumor was tested for the following markers: CA125 Results of the tumor marker test revealed 1938    12/03/2013 Imaging   Successful ultrasound-guided therapeutic paracentesis yielding 3.6 liters of peritoneal fluid.    12/07/2013 - 05/30/2014 Chemotherapy   She received 3 cycles of carboplatin and Taxol, interrupted cycle 4 for surgery.  She subsequently completed 3 more cycles of chemotherapy after surgery    12/20/2013 Imaging   Successful ultrasound-guided diagnostic and therapeutic paracentesis yielding 2.2 liters of peritoneal fluid    12/20/2013 Pathology Results   PERITONEAL/ASCITIC FLUID(SPECIMEN 1 OF 1 COLLECTED 12/20/13): MALIGNANT CELLS CONSISTENT WITH SEROUS CARCINOMA    01/13/2014 Tumor Marker   Patient's tumor was tested for the following markers: CA125 Results of the  tumor marker test revealed 227    02/07/2014 Tumor Marker   Patient's tumor was tested for the following markers: CA125 Results of the tumor marker test revealed 130    02/15/2014 Genetic Testing   Genetics testing from 02-2014 normal ([employer] panel)    02/23/2014 Imaging   Interval decrease in ascites. 2. Morphologic changes in the liver consistent with cirrhosis. 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 Portal vein remains patent at this time. 3. Persistent but decreased abnormal soft tissue attenuation tracking in the omental fat.  This may be secondary to interval improvement in metastatic disease. 4. Peritoneal thickening seen in the left abdomen and pelvis on the previous study has decreased and nearly resolved in the interval. This also suggests interval improvement in metastatic disease.      03/07/2014 Procedure   Ultrasound and fluoroscopically guided right internal jugular single lumen power port catheter insertion. Tip in the SVC/RA junction. Catheter ready for use.    03/17/2014 Tumor Marker   Patient's tumor was tested for the following markers: CA125 Results of the tumor marker test revealed 45    03/29/2014 Pathology Results   1. Omentum, resection for tumor - HIGH GRADE SEROUS CARCINOMA, SEE COMMENT. 2. Ovary and fallopian tube, right - HIGH GRADE SEROUS CARCINOMA, SEE COMMENT. 3. Ovary and fallopian tube, left - HIGH GRADE SEROUS CARCINOMA, SEE COMMENT. Diagnosis Note 1. Nests and clusters of malignant cells are invading the omental tissue (part #1) with associated fibrosis. The cells are pleomorphic with prominent nucleoli. There are scattered psammoma bodies. The ovaries are atrophic and exhibit multiple foci of surface based invasive carcinoma and associated fibrosis. The fallopian tubes have a few foci of carcinoma, also superficially located. There are no precursor lesions noted in the ovaries or fallopian tubes (the entire tubes and ovaries were submitted for evaluation). While there is some retraction artifact, there are several foci suspicious for lymphovascular invasion. 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 Given the fibrosis, there does appear to be a small amount of treatment effect, however, there is abundant residual tumor.     03/29/2014 Surgery   Procedure(s) Performed: 1. Exploratory laparotomy with bilateral salpingo-oophorectomy, omentectomy radical  tumor debulking for ovarian cancer .   Surgeon: Thereasa Solo, MD.   Assistant Surgeon: Lahoma Crocker, M.D. Assistant: (an MD assistant was necessary for tissue manipulation, retraction and positioning due to the complexity of the case and hospital policies).  Operative Findings: 10cm omental cake from hepatic to splenic flexure, densely adherent to transverse colon. Milliary studding of tumor implants (<20m, too numerous in number to count) adherent to the mesentery of the small bowel and small bowel wall. Small volume (100cc) ascites. Small ovaries bilaterally, densely adherent to the pelvic cul de sac. Left ovary and tube densely adherent to sigmoid colon. Cirrhotic liver with hepatomegaly and splenomegaly.     This represented an optimal cytoreduction (R1) with visible disease residual on the bowel wall and mesentery (millial, <367mimplants).       04/18/2014 Tumor Marker   Patient's tumor was tested for the following markers: CA125 Results of the tumor marker test revealed 122    05/16/2014 Tumor Marker   Patient's tumor was tested for the following markers: CA125 Results of the tumor marker test revealed 30    06/10/2014 Tumor Marker   Patient's tumor was tested for the  following markers: CA125 Results of the tumor marker test revealed 19    07/04/2014 Imaging   Interval improvement in the appearance of peritoneal metastasis secondary to ovarian cancer. 2. 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      08/18/2014 Tumor Marker   Patient's tumor was tested for the following markers: CA125 Results of the tumor marker test revealed 16    10/13/2014 Tumor Marker   Patient's tumor was tested for the following markers: CA125 Results of the tumor marker test revealed 14    11/21/2014 Tumor Marker   Patient's tumor was tested for the following markers: CA125 Results of the tumor marker test revealed 16    12/28/2014 Tumor Marker   Patient's tumor was tested  for the following markers: CA125 Results of the tumor marker test revealed 762    02/27/2015 Tumor Marker   Patient's tumor was tested for the following markers: CA125 Results of the tumor marker test revealed 20.2    03/22/2015 Imaging   Filler appearance to the prior exam, with very subtle fluid tracking along portions of the liver, and very minimal nodularity along the right paracolic gutter representing residua from the prior peritoneal cancer. The current abnormalities could simply be post therapy findings rather than necessarily representing residual malignancy. No new or enlarging lesions are identified. 2. Hepatic cirrhosis and splenomegaly. There is some gastric varices suggesting portal venous hypertension. 3. Left foraminal stenosis at L4-5 due to spurring. There is likely also some central narrowing of the thecal sac at this level. 4. Bibasilar scarring. 5. Chronic mild left mid kidney scarring    03/27/2015 Tumor Marker   Patient's tumor was tested for the following markers: CA125 Results of the tumor marker test revealed 25.3    04/24/2015 Imaging   Disc bulge L2-3 with mild spinal stenosis and right foraminal encroachment. 2. Disc bulge L3-4 with mild spinal stenosis. 3. Multifactorial spinal, left lateral recess and foraminal stenosis L4-5. There is grade 1 anterolisthesis without evident dynamic instability.    05/29/2015 Tumor Marker   Patient's tumor was tested for the following markers: CA125 Results of the tumor marker test revealed 29.9    08/21/2015 Tumor Marker   Patient's tumor was tested for the following markers: CA125 Results of the tumor marker test revealed 45    09/18/2015 Tumor Marker   Patient's tumor was tested for the following markers: CA125 Results of the tumor marker test revealed 47    09/27/2015 Imaging   New small bowel mesenteric nodule and enlarged left external iliac lymph node, highly worrisome for metastatic disease. 2. Cirrhosis and  splenomegaly    10/10/2015 Tumor Marker   Patient's tumor was tested for the following markers: CA125 Results of the tumor marker test revealed 53.4    10/10/2015 - 12/11/2015 Chemotherapy   She received 4 cycles of carboplatin only    11/20/2015 Tumor Marker   Patient's tumor was tested for the following markers: CA125 Results of the tumor marker test revealed 41.3    12/20/2015 Imaging   CT: Mild left external iliac lymphadenopathy is slightly decreased. Mildly enlarged lower mesenteric nodule is slightly decreased. 2. No new or progressive metastatic disease in the abdomen or pelvis. 3. Cirrhosis. Stable subcentimeter hypodense left liver lobe lesion. No new liver lesions. 4. Stable mild splenomegaly.  No ascites. 5. Mild sigmoid diverticulosis.    01/01/2016 -  Anti-estrogen oral therapy   She was placed on tamoxifen. Had letrozole initially but switched to tamoxifen due  to poor tolerance    01/15/2016 Tumor Marker   Patient's tumor was tested for the following markers: CA125 Results of the tumor marker test revealed 31.4    02/19/2016 Tumor Marker   Patient's tumor was tested for the following markers: CA125 Results of the tumor marker test revealed 36.5    05/13/2016 Imaging   No evidence of disease progression within the abdomen or pelvis. Previously noted small central mesenteric nodule and left external iliac node appear slightly smaller. 2. Stable changes of hepatic cirrhosis and portal hypertension with associated splenomegaly. No new or enlarging hepatic lesions are identified. 3. No acute findings.    05/13/2016 Tumor Marker   Patient's tumor was tested for the following markers: CA125 Results of the tumor marker test revealed 41.2    06/24/2016 Tumor Marker   Patient's tumor was tested for the following markers: CA125 Results of the tumor marker test revealed 47.3    08/20/2016 Imaging   1. The left external iliac lymph node is slightly increased in size compared  to the prior exam, currently 1.1 cm and previously 0.9 cm in diameter. 2. Stable central mesenteric lymph node at 0.9 cm in diameter. 3. Stable trace free pelvic fluid and stable slight thickening along the right paracolic gutter, without well-defined peritoneal nodularity. 4. Other imaging findings of potential clinical significance: Subsegmental atelectasis or scarring in the lung bases. Hepatic cirrhosis. Left renal scarring. Mild splenomegaly. Sigmoid colon diverticulosis. Impingement at L4-5 due to spondylosis and degenerative disc disease broad Schmorl' s nodes at L3-L4. Pelvic floor laxity    08/23/2016 Tumor Marker   Patient's tumor was tested for the following markers: CA125 Results of the tumor marker test revealed 80.2    09/04/2016 Imaging   LV EF: 60% -   65%    09/12/2016 Tumor Marker   Patient's tumor was tested for the following markers: CA125 Results of the tumor marker test revealed 98.5    09/12/2016 - 01/29/2018 Chemotherapy   The patient received Doxil and Avastin. Avastin was discontinued in Dec 2019 due to GI hemorrhage    09/26/2016 Tumor Marker   Patient's tumor was tested for the following markers: CA125 Results of the tumor marker test revealed 68.9    10/10/2016 Tumor Marker   Patient's tumor was tested for the following markers: CA125 Results of the tumor marker test revealed 65.6    11/07/2016 Tumor Marker   Patient's tumor was tested for the following markers: CA125 Results of the tumor marker test revealed 46.4    11/29/2016 Imaging   Stable mild peritoneal thickening, suspicious for peritoneal carcinomatosis. No new or progressive disease identified. No evidence of ascites.  No significant change in 10 mm left external iliac and 8 mm mesenteric lymph nodes.  Stable hepatic cirrhosis, and splenomegaly consistent with portal venous hypertension.  Colonic diverticulosis, without radiographic evidence of diverticulitis.    12/02/2016 Imaging    Normal LV size with EF 55%. Basal inferior and basal inferoseptal hypokinesis. Normal RV size and systolic function. No significant valvular abnormalities.    12/05/2016 Tumor Marker   Patient's tumor was tested for the following markers: CA125 Results of the tumor marker test revealed 49.1    01/09/2017 Tumor Marker   Patient's tumor was tested for the following markers: CA125 Results of the tumor marker test revealed 47.2    02/06/2017 Tumor Marker   Patient's tumor was tested for the following markers: CA125 Results of the tumor marker test revealed 44.1    02/18/2017  Imaging   Stable mild peritoneal thickening. No new or progressive disease identified within the abdomen or pelvis.  Stable tiny sub-cm left external iliac and mesenteric lymph nodes.  Stable hepatic cirrhosis, and splenomegaly consistent with portal venous hypertension. No evidence of hepatic neoplasm.  Colonic diverticulosis, without radiographic evidence of diverticulitis.    03/06/2017 Tumor Marker   Patient's tumor was tested for the following markers: CA125 Results of the tumor marker test revealed 50.4    03/17/2017 Imaging   - Left ventricle: The cavity size was normal. Wall thickness was normal. Systolic function was normal. The estimated ejection fraction was in the range of 55% to 60%. Wall motion was normal; there were no regional wall motion abnormalities. Features are consistent with a pseudonormal left ventricular filling pattern, with concomitant abnormal relaxation and increased filling pressure (grade 2 diastolic dysfunction). - Mitral valve: There was mild regurgitation. - Left atrium: The atrium was mildly dilated    04/03/2017 Tumor Marker   Patient's tumor was tested for the following markers: CA125 Results of the tumor marker test revealed 47.2    05/14/2017 Imaging   CT scan of abdomen and pelvis Stable mild peritoneal thickening. No new or progressive disease within the abdomen or  pelvis.  Stable hepatic cirrhosis. Stable splenomegaly, consistent with portal venous hypertension. No evidence of hepatic neoplasm.    05/15/2017 Tumor Marker   Patient's tumor was tested for the following markers: CA125 Results of the tumor marker test revealed 56.1    06/12/2017 Tumor Marker   Patient's tumor was tested for the following markers: CA125 Results of the tumor marker test revealed 49.2    07/10/2017 Tumor Marker   Patient's tumor was tested for the following markers: CA125 Results of the tumor marker test revealed 46.3    08/07/2017 Tumor Marker   Patient's tumor was tested for the following markers: CA125 Results of the tumor marker test revealed 52.9    08/20/2017 Imaging   1. Mild omental/peritoneal haziness, unchanged. No evidence of new metastatic disease. 2. Cirrhosis with splenomegaly.    09/04/2017 Tumor Marker   Patient's tumor was tested for the following markers: CA125 Results of the tumor marker test revealed 48.5    11/13/2017 Tumor Marker   Patient's tumor was tested for the following markers: CA125 Results of the tumor marker test revealed 55.8    12/17/2017 Imaging   1. No findings to suggest metastatic disease in the abdomen or pelvis. 2. Severe hepatic cirrhosis with evidence of portal hypertension, as demonstrated by dilated portal vein, splenomegaly and portosystemic collateral pathways, including gastric and esophageal varices.  3. Colonic diverticulosis without evidence of acute diverticulitis at this time. 4. Additional incidental findings, as above.    01/16/2018 Tumor Marker   Patient's tumor was tested for the following markers: CA125 Results of the tumor marker test revealed 63.7    02/10/2018 Procedure   EGD - Recently bleeding grade III and large (> 5 mm) esophageal varices. Completely eradicated. Banded. - Portal hypertensive gastropathy. - Normal examined duodenum. - No specimens collected    02/13/2018 Tumor Marker    Patient's tumor was tested for the following markers: CA125 Results of the tumor marker test revealed 82.7    02/25/2018 Imaging   Bone density showed mild osteopenia    04/08/2018 Tumor Marker   Patient's tumor was tested for the following markers: CA125 Results of the tumor marker test revealed 82.7    04/08/2018 Imaging   1. No definite omental or peritoneal  surface lesions. However, there are 2 slowly enlarging lymph nodes noted. One is in the small bowel mesentery and the other is in the left deep pelvis. Could not exclude recurrent disease. 2. Stable advanced cirrhotic changes involving the liver with portal venous hypertension, portal venous collaterals, esophageal varices and splenomegaly. No worrisome hepatic lesions. 3. Diffuse wall thickening of the colon could suggest diffuse colitis or could be due to low albumin. Recommend correlation with any symptoms such as diarrhea.    05/21/2018 Tumor Marker   Patient's tumor was tested for the following markers: CA125 Results of the tumor marker test revealed 86    09/07/2018 Tumor Marker   Patient's tumor was tested for the following markers:CA-125 Results of the tumor marker test revealed 111   09/29/2018 Tumor Marker   Patient's tumor was tested for the following markers: CA-125 Results of the tumor marker test revealed 121.   09/29/2018 - 11/24/2018 Chemotherapy   The patient had carboplatin and gemzar for chemotherapy treatment.  Chemo was stopped due to allergic reaction to carboplatin   10/27/2018 Tumor Marker   Patient's tumor was tested for the following markers: CA-125 Results of the tumor marker test revealed 99.9   11/24/2018 Tumor Marker   Patient's tumor was tested for the following markers: CA-125 Results of the tumor marker test revealed 81.2   12/14/2018 Tumor Marker   Patient's tumor was tested for the following markers: CA-125 Results of the tumor marker test revealed 81.2   12/14/2018 Imaging   1. Mild  improvement in peritoneal disease. Peritoneal nodule within the left posterior pelvis contiguous with the left side of vaginal cuff is slightly decreased in size in the interval. Within the right iliac fossa there is a small peritoneal nodule which has decreased in size in the interval. Central small bowel mesenteric lymph node is not significantly changed. Slight decrease in size left periaortic lymph node.    2. Morphologic features of the liver compatible with cirrhosis. Splenomegaly is noted which may reflect portal venous hypertension.   12/21/2018 Tumor Marker   Patient's tumor was tested for the following markers: CA-125 Results of the tumor marker test revealed 64.2   12/21/2018 -  Chemotherapy   The patient had taxotere for chemotherapy treatment.     03/02/2019 Tumor Marker   Patient's tumor was tested for the following markers: CA-125. 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   No new or progressive metastatic disease within the abdomen or pelvis.   Hepatic cirrhosis and findings of portal venous hypertension. 1.1 cm low-attenuation lesion within left hepatic lobe, which could represent a regenerative nodule or small hepatocellular carcinoma. Recommend abdomen MRI without and with contrast for further characterization.     04/13/2019 Tumor Marker   Patient's tumor was tested for the following markers: CA-125 Results of the tumor marker test revealed 36.8   05/26/2019 Tumor Marker   Patient's tumor was tested for the following markers: CA-125 Results of the tumor marker test revealed 31.6.   06/15/2019 Tumor Marker   Patient's tumor was tested for the following markers: CA-125 Results of the tumor marker test revealed 25.4   07/05/2019 Imaging   1. Stable CT examination. Stable peritoneal thickening in the pelvis and paracolic gutters. Stable solid left deep pelvic implant.  Stable trace ascites. No new or progressive metastatic disease. 2. Stable cirrhosis. No discrete liver masses. 3. Stable mild-to-moderate splenomegaly. 4. Aortic  Atherosclerosis (ICD10-I70.0).   07/27/2019 Tumor Marker   Patient's tumor was tested for the following markers: CA-125 Results of the tumor marker test revealed 35.4   08/17/2019 Tumor Marker   Patient's tumor was tested for the following markers: Ca-125 Results of the tumor marker test revealed 35.7.   09/17/2019 - 09/21/2019 Hospital Admission   She was admitted due to esophageal variceal bleeding presents with hematemesis and melanotic stool with epigastric pain.  GI consulted, started on octreotide infusion.  Underwent EGD which showed minimal bleeding, remain on octreotide drip, PPI drip.  Hemoglobin trended down therefore received PRBC transfusion.  Over several days her bleeding subsided, hemodynamically remained stable.    09/17/2019 Procedure   EGD report - Esophageal mucosal scarring, from prior esophageal banding sessions. - Grade I esophageal varices. - Red blood in the cardia and in the gastric fundus. - Portal hypertensive gastropathy. - Normal duodenal bulb, first portion of the duodenum and second portion of the duodenum. 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 2. Otherwise stable CT exam. Stable peritoneal thickening within the bilateral deep pelvic peritoneum. Stable left deep pelvic implant.No evidence for progressed disease. 3. Stable splenomegaly. 4. Morphologic changes to the liver compatible with cirrhosis. Trace  perihepatic and pelvic ascites.   02/22/2020 Imaging   1. New lucent lesion in the left side of the L4 vertebral body measuring 1 cm, concerning for possible osseous metastasis. Consideration for further evaluation with bone scan is suggested. Close attention on follow-up imaging is also recommended to ensure stability. 2. Previously noted peritoneal thickening and nodularity in the low anatomic pelvis has regressed slightly. 3. Borderline enlarged and mildly enlarged pelvic and retroperitoneal lymph nodes appear similar to slightly increased compared to the prior examination, as above. Continued attention on follow-up studies is recommended.  4. 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 5. Aortic atherosclerosis. 6. Additional incidental findings, as above.   02/22/2020 Tumor Marker   Patient's tumor was tested for the following markers: CA-125 Results of the tumor marker test revealed 30.1   04/06/2020 Tumor Marker   Patient's tumor was tested for the following markers: CA-125 Results of the tumor marker test revealed 35.7   09/11/2020 Tumor Marker   Patient's tumor was tested for the following markers: CA-125. Results of the tumor marker test revealed 26.6.   09/12/2020 Imaging   1. Slow enlargement of a currently 2.9 cubic cm soft tissue nodule along the left adnexa. Tumor deposit not excluded. 2. The left external iliac lymph node has substantially reduced in size, currently 0.9 cm in short axis and formerly 1.4 cm. 3. 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 4. Cirrhosis with portal venous hypertension. 5. Mild distal esophageal wall thickening, probably from esophagitis. Splenomegaly. 6. Scarring in the left kidney. 7. Sigmoid colon diverticulosis. 8. Pelvic floor laxity with small cystocele. 9.  Lumbar impingement at L2-3, L3-4, and L4-5.     10/23/2020 Tumor Marker   Patient's tumor was tested for the following markers: CA-125. Results of the tumor marker test revealed 35.3.  12/12/2020 Tumor Marker   Patient's tumor was tested for the following markers: CA-125. Results of the tumor marker test revealed 39.1.   12/13/2020 Imaging   1. Continued increase in size of soft tissue nodule within the left posterior pelvis adjacent to the sigmoid colon. Cannot exclude small tumor deposit. 2. Similar appearance of thickening and mild nodularity along the peritoneal reflections of the pelvis and pericolic gutters. 3. Morphologic findings of the liver compatible with cirrhosis. 4. Splenomegaly. 5. Aortic Atherosclerosis (ICD10-I70.0).   01/24/2021 Tumor Marker   Patient's tumor was tested for the following markers: CA-125. Results of the tumor marker test revealed 56.2.   02/09/2021 Imaging   1. Mild interval increased size of the solid left adnexal mass which may represent tumor recurrence/deposit. The mass abuts the adjacent sigmoid colon as well as the left posterior wall of the urinary bladder, invasion not excluded. 2. Otherwise no new mass or lymphadenopathy identified in the abdomen or pelvis. 3. Hepatic cirrhosis. Splenomegaly consistent with portal hypertension.   04/11/2021 Imaging   1. Left posterior pelvic soft tissue mass, adjacent to the sigmoid colon, has slightly increased in size in comparison to recent prior with significant interval increase in size over more remote priors, findings which are highly suspicious for active neoplastic disease involvement. 2. No new discrete omental or peritoneal nodules/masses identified. 3. Similar nodular thickening of the peritoneum along the bilateral pericolic gutters and in the pelvis without ascites. 4. No pathologically enlarged abdominal or pelvic lymph nodes and no solid organ metastases visualized. 5. Cirrhotic hepatic morphology  with evidence of portal hypertension including splenomegaly. 6. Sigmoid colonic diverticulosis without findings of acute diverticulitis. 7. Moderate volume of formed stool throughout the colon. 8. Aortic Atherosclerosis (ICD10-I70.0).   04/11/2021 Tumor Marker   Patient's tumor was tested for the following markers: CA-125. Results of the tumor marker test revealed 79.4 .   04/23/2021 -  Chemotherapy   Patient is on Treatment Plan : Ovarian Docetaxel q21d      04/24/2021 Tumor Marker   Patient's tumor was tested for the following markers: CA-125. Results of the tumor marker test revealed 68.5.   05/16/2021 Tumor Marker   Patient's tumor was tested for the following markers: CA-125. Results of the tumor marker test revealed 68.1.   06/28/2021 Tumor Marker   Patient's tumor was tested for the following markers: CA-125. Results of the tumor marker test revealed 52.   07/16/2021 Imaging   1. Stable cirrhotic changes involving the liver. Associated portal venous hypertension, portal venous collaterals and splenomegaly. No hepatic lesions. 2. Slight interval decrease in size of the left pelvic implant. 3. Stable minimal peritoneal nodularity involving the pericolic gutters bilaterally. No new or progressive findings.       PHYSICAL EXAMINATION: ECOG PERFORMANCE STATUS: 1 - Symptomatic but completely ambulatory  Vitals:   07/17/21 0813  BP: (!) 94/59  Pulse: 72  Resp: 18  Temp: 98.5 F (36.9 C)  SpO2: 100%   Filed Weights   07/17/21 0813  Weight: 176 lb 3.2 oz (79.9 kg)    GENERAL:alert, no distress and comfortable NEURO: alert & oriented x 3 with fluent speech, no focal motor/sensory deficits  LABORATORY DATA:  I have reviewed the data as listed    Component Value Date/Time   NA 141 07/13/2021 0738   NA 142 02/06/2017 0742   K 4.0 07/13/2021 0738   K 3.9 02/06/2017 0742   CL 110 07/13/2021 0738   CO2 25 07/13/2021 0738   CO2  22 02/06/2017 0742   GLUCOSE 90 07/13/2021  0738   GLUCOSE 156 (H) 02/06/2017 0742   BUN 17 07/13/2021 0738   BUN 13.5 02/06/2017 0742   CREATININE 0.78 07/13/2021 0738   CREATININE 0.8 02/06/2017 0742   CALCIUM 9.1 07/13/2021 0738   CALCIUM 8.5 02/06/2017 0742   PROT 6.9 07/13/2021 0738   PROT 6.9 02/06/2017 0742   ALBUMIN 4.0 07/13/2021 0738   ALBUMIN 3.7 02/06/2017 0742   AST 19 07/13/2021 0738   AST 47 (H) 02/06/2017 0742   ALT 17 07/13/2021 0738   ALT 54 02/06/2017 0742   ALKPHOS 86 07/13/2021 0738   ALKPHOS 130 02/06/2017 0742   BILITOT 0.5 07/13/2021 0738   BILITOT 0.65 02/06/2017 0742   GFRNONAA >60 07/13/2021 0738   GFRAA >60 11/09/2019 0953   GFRAA >60 10/26/2019 0722    No results found for: SPEP, UPEP  Lab Results  Component Value Date   WBC 4.8 07/13/2021   NEUTROABS 3.7 07/13/2021   HGB 11.8 (L) 07/13/2021   HCT 34.7 (L) 07/13/2021   MCV 94.0 07/13/2021   PLT 85 (L) 07/13/2021      Chemistry      Component Value Date/Time   NA 141 07/13/2021 0738   NA 142 02/06/2017 0742   K 4.0 07/13/2021 0738   K 3.9 02/06/2017 0742   CL 110 07/13/2021 0738   CO2 25 07/13/2021 0738   CO2 22 02/06/2017 0742   BUN 17 07/13/2021 0738   BUN 13.5 02/06/2017 0742   CREATININE 0.78 07/13/2021 0738   CREATININE 0.8 02/06/2017 0742      Component Value Date/Time   CALCIUM 9.1 07/13/2021 0738   CALCIUM 8.5 02/06/2017 0742   ALKPHOS 86 07/13/2021 0738   ALKPHOS 130 02/06/2017 0742   AST 19 07/13/2021 0738   AST 47 (H) 02/06/2017 0742   ALT 17 07/13/2021 0738   ALT 54 02/06/2017 0742   BILITOT 0.5 07/13/2021 0738   BILITOT 0.65 02/06/2017 0742       RADIOGRAPHIC STUDIES: I reviewed multiple imaging studies with the patient I have personally reviewed the radiological images as listed and agreed with the findings in the report. CT ABDOMEN PELVIS W CONTRAST  Result Date: 07/14/2021 CLINICAL DATA:  Restaging ovarian cancer. EXAM: CT ABDOMEN AND PELVIS WITH CONTRAST TECHNIQUE: Multidetector CT imaging of the  abdomen and pelvis was performed using the standard protocol following bolus administration of intravenous contrast. RADIATION DOSE REDUCTION: This exam was performed according to the departmental dose-optimization program which includes automated exposure control, adjustment of the mA and/or kV according to patient size and/or use of iterative reconstruction technique. CONTRAST:  143m OMNIPAQUE IOHEXOL 300 MG/ML  SOLN COMPARISON:  Numerous prior imaging studies. The most recent CT scans 04/08/2021 FINDINGS: Lower chest: The heart is normal in size. No pericardial effusion. Streaky areas atelectasis and scarring at the lung bases but no worrisome pulmonary lesions. No pleural effusions. Hepatobiliary: Stable cirrhotic changes involving the liver with portal venous hypertension, portal venous collaterals, splenorenal shunt and associated splenomegaly. No hepatic lesions are identified. The gallbladder is unremarkable. No common bile duct dilatation. Pancreas: No mass, inflammation or ductal dilatation. Spleen: Stable marked splenomegaly. No splenic lesions. Adrenals/Urinary Tract: Adrenal glands are normal in stable. No renal lesions or hydronephrosis. The bladder is unremarkable. Stomach/Bowel: The stomach, duodenum, small bowel and colon are unremarkable. No acute inflammatory process, mass lesions or obstructive findings. Vascular/Lymphatic: The aorta and branch vessels are patent. The major venous structures are patent.  The portal and splenic veins patent. Small scattered abdominal lymph nodes are stable and likely related to the patient's cirrhosis. Left pelvic implant measures 2.9 x 2.3 cm on image 77/2. It previously measured 3.0 x 2.7 cm. Stable peritoneal nodularity involving the pericolic gutters bilaterally. No new or progressive findings. No discrete omental disease. Reproductive: Surgically absent. Other: No free abdominal/pelvic fluid collections. No subcutaneous lesions. Musculoskeletal: No  significant bony findings. IMPRESSION: 1. Stable cirrhotic changes involving the liver. Associated portal venous hypertension, portal venous collaterals and splenomegaly. No hepatic lesions. 2. Slight interval decrease in size of the left pelvic implant. 3. Stable minimal peritoneal nodularity involving the pericolic gutters bilaterally. No new or progressive findings. * Tracking Code: BO * Electronically Signed   By: Marijo Sanes M.D.   On: 07/14/2021 14:15

## 2021-07-17 NOTE — Assessment & Plan Note (Signed)
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  Her mild anemia is likely due to chemotherapy

## 2021-07-24 NOTE — H&P (Signed)
History of Present Illness  General:          Patient is a 76 year old female with history of grade II esoophageal varicies, Liver cirrhosis due to NASH, DM, IDA and ovarian cancer presenting for repeat esophageal variceal banding.        She just started chemotherapy on Monday for a peritoneal mass. She would like to keep her procedure right before she starts a new round of chemo.         Today patient reports she is doing well today, she is back in her chemotherapy. She is nauseated due to medications, she is using Zofran as needed. She notes occasional vomiting, she notes 2 episodes of a tinge of blood in the emesis last seen yesterday. Denies Melena, or large volume of blood in emesis.         Notes constipation for which she takes 4 colace at night.         Denies diarrhea, abdominal pain, straining, melena, hematochezia and unintentional weight loss. Denies family history of colon cancer. Denies blood thinner use, but she notes her platlets 3/13 was 77. Denies NSAID use. Denies alcohol use. Denies smoking.         3/13 Hgb 12.2        EGD with banding 06/13/2020        Normal upper third of esophagus, grade 2 esophageal varices completely eradicated and banded, regular Z line        Erythematous mucosa in the antrum, nonbleeding gastric ulcer forrest class III, portal hypertensive gastropathy, normal duodenum no biopsies. recall 6 months.     Current Medications  Taking  Nadolol 20 MG Tablet TAKE 1 TABLET BY MOUTH EVERY DAY     Gabapentin 300 MG Capsule 3 capsules Orally Twice a day    HYDROcodone-Acetaminophen 5-325 MG Tablet 1 tablet as needed Orally every 6 hrs    Pantoprazole Sodium 40 MG Tablet Delayed Release 1 tablet Orally Once a day    Neupro(Rotigotine) 4 MG/24HR Patch 24 Hour 1 patch to skin Transdermal     Zofran(Ondansetron HCl) 8 MG Tablet 1 tablet as needed Orally     Vitamin D (Cholecalciferol) 25 MCG (1000 UT) Tablet 1 tablet Orally Once a day    Lidocaine 4  % Cream 1 application as needed Externally     Ozempic (0.25 or 0.5 MG/DOSE)(Semaglutide(0.25 or 0.5MG/DOS))     Tyler Aas FlexTouch(Insulin Degludec) 100 UNIT/ML Solution Pen-injector 18 units Subcutaneous once daily    Dexamethasone 4 MG Tablet 1 tablet Orally Once a day   Not-Taking  HumaLOG(Insulin Lispro) 100 UNIT/ML Solution as directed Subcutaneous twice a day    Jardiance 71m . Tablets 1 tablet by mouth once daily, taken in the morning, with or without food, Notes: Dr. STobie Lords   Ciprofloxacin HCl 500 MG Tablet 1 tablet Orally Twice a day    metroNIDAZOLE 500 MG Tablet 1 tablet Orally Twice a day    HCTZ 12.5 MG Tablet 1 tablet Orally Once a day    Vancomycin HCl 125 MG Capsule 1 capsule Orally Four times a day    Sucralfate 1 GM/10ML Suspension TAKE 10 MLS BY MOUTH FOUR TIMES DAILY FOR 14 DAYS     Medication List reviewed and reconciled with the patient         Past Medical History        Acute GI bleeding.         Esophageal varices.  Duodenal mass.         Cirrhosis-NASH.         Ovarian cancer .         Diabetes mellitus.         Overactive bladder .         Non-ST elevated myocardial infarction (non-STEMI) ([place]).         Glaucoma, narrow-angle.         Portal hypertension.         Restless leg syndrome.         Asthma .         Iron def anemia .         Peritoneal carcinoma.        Surgical History         EGD-Banding 2019,2020, 02/2019, 05/2019,         Orif ankle fracture (Right)          Salpingoophorectomy (Bilateral) 2016         Cardiac catheterization 2012         Partial hysterectomy 1988         Tonsillectomy 1971         Tubal ligation          Eye surgery (Bilateral)          Dental surgery          Port cath placement          EGD 2020         Colonoscopy 2020         EGD w Banding 06/2020       Family History   Father: deceased, Bladder cancer, diagnosed with  Diabetes, Hypertension   Mother: deceased, diagnosed with Diabetes   No Family History of Colon Cancer, Polyps, or Liver Disease.       Social History  General:   Tobacco use       cigarettes:  Former smoker     Quit in year  02/11/1982     Tobacco history last updated  04/26/2021 no Alcohol.  no Recreational drug use.     Allergies   Shellfish-derived Products: anaphylaxis - Allergy   Benadryl: restless legs - Side Effects   Penicillin V Potassium: rash - Side Effects   Tape: rash - Side Effects   Carboplatin: Stop breathing - Side Effects   Vancomycin HCl: Blood vessels - Side Effects       Hospitalization/Major Diagnostic Procedure   NASH 02-2018   Fell - head accident. Medication reaction to chemo 12/2018   not within the past yr 07/2020       Review of Systems  GI PROCEDURE:          Pacemaker/ AICD no.  Artificial heart valves no.  MI/heart attack no.  Abnormal heart rhythm no.  Angina no.  CVA no.  Hypertension no.  Hypotension no.  Asthma, COPD no.  Sleep apnea no.  Seizure disorders no.  Artificial joints no.  Diabetes type 2.  Significant headaches no.  Vertigo no.  Depression/anxiety no.  Abnormal bleeding no.  Kidney Disease no.  Liver disease no.  Blood transfusion no.            Vital Signs  Wt 177.8, Wt change -15.2 lbs, Ht 61, BMI 33.59, Temp 98.1, Pulse sitting 78, BP sitting 112/74.     Examination  Gastroenterology Exam:        GENERAL APPEARANCE: Well developed, well nourished, no active distress,  pleasant, no acute distress.         RESPIRATORY Breath sounds normal. Respiration even and unlabored.         CARDIOVASCULAR Normal RRR w/o murmers or gallops. No peripheral edema.         ABDOMEN No masses palpated. Liver and spleen not palpated, normal. Bowel sounds normal, Abdomen not distended.         EXTREMITIES: No edema, pulses intact.         SKIN Warm and dry, good turgor without rashes.         PSYCHIATRIC Alert and oriented x3,  mood and affect appear normal..       Assessments     1. History of esophageal varices - Z87.19 (Primary)   2. Cirrhosis of liver without ascites, unspecified hepatic cirrhosis type - K74.60   3. History of ovarian cancer - Z85.43     Treatment   1. History of esophageal varices         IMAGING: EGD with Band Ligation of Varices              Notes: The procedure EGD was thoroughly explained to the patient, including the risks, benefits, and alternatives. We discussed the complications including bleeding, perforation, sedation problems and missing something. The patient verbalized understanding and agreed to procedure.   .    Clinical Notes: Discussed if starts to have melena or large volume of blood in emesis she need to present to the ED. Will try to schedule procedure right before she begins a new round of chemotherapy. Procedure will need to be done in hospital. Patient does not know her chemo schedule past April will reschedule if needed.      2. Cirrhosis of liver without ascites, unspecified hepatic cirrhosis type   Notes: There are no signs of decomposition currently. No ascites/pedal edema, no hepatic encephalopathy and no varices bleeding. Will follow up in 6 months     3. History of ovarian cancer   Notes: Currently starting chemotherapy. We will try to schedule around her chemotherapy cycle.

## 2021-07-24 NOTE — Anesthesia Preprocedure Evaluation (Signed)
Anesthesia Evaluation  Patient identified by MRN, date of birth, ID band Patient awake    Reviewed: Allergy & Precautions, NPO status , Patient's Chart, lab work & pertinent test results  Airway Mallampati: II  TM Distance: >3 FB Neck ROM: Full    Dental no notable dental hx. (+) Teeth Intact, Dental Advisory Given   Pulmonary former smoker,    Pulmonary exam normal breath sounds clear to auscultation       Cardiovascular hypertension, + Past MI and +CHF  Normal cardiovascular exam Rhythm:Regular Rate:Normal  2020 Echo 1. Left ventricular ejection fraction, by visual estimation, is 60 to  65%. The left ventricle has normal function. There is no left ventricular  hypertrophy.  2. Left ventricular diastolic parameters are consistent with Grade I  diastolic dysfunction (impaired relaxation).  3. Global right ventricle has mildly reduced systolic function.The right  ventricular size is normal. No increase in right ventricular wall  thickness.  4. Left atrial size was normal.  5. Right atrial size was normal.  6. The mitral valve is normal in structure. Trace mitral valve  regurgitation.  7. The tricuspid valve is normal in structure. Tricuspid valve  regurgitation is mild.  8. The aortic valve is tricuspid. Aortic valve regurgitation is not  visualized. Mild aortic valve sclerosis without stenosis.  9. The pulmonic valve was normal in structure. Pulmonic valve  regurgitation is trivial.  10. The RV is not seen well in all angles but appears mildly hypokinetic.  There is no evidence of RV dilation or pulmonary HTN.    Neuro/Psych    GI/Hepatic (+) Cirrhosis   Esophageal Varices    , NASH   Endo/Other  diabetes  Renal/GU      Musculoskeletal   Abdominal   Peds  Hematology   Anesthesia Other Findings EGD for Esophagael banding  Reproductive/Obstetrics                             Anesthesia Physical Anesthesia Plan  ASA: 3  Anesthesia Plan: MAC   Post-op Pain Management:    Induction:   PONV Risk Score and Plan: Treatment may vary due to age or medical condition  Airway Management Planned: Natural Airway and Nasal Cannula  Additional Equipment: None  Intra-op Plan:   Post-operative Plan:   Informed Consent: I have reviewed the patients History and Physical, chart, labs and discussed the procedure including the risks, benefits and alternatives for the proposed anesthesia with the patient or authorized representative who has indicated his/her understanding and acceptance.     Dental advisory given  Plan Discussed with:   Anesthesia Plan Comments: (varicies)       Anesthesia Quick Evaluation

## 2021-07-25 ENCOUNTER — Ambulatory Visit (HOSPITAL_COMMUNITY): Payer: Medicare Other | Admitting: Anesthesiology

## 2021-07-25 ENCOUNTER — Encounter (HOSPITAL_COMMUNITY): Payer: Self-pay | Admitting: Gastroenterology

## 2021-07-25 ENCOUNTER — Ambulatory Visit (HOSPITAL_BASED_OUTPATIENT_CLINIC_OR_DEPARTMENT_OTHER): Payer: Medicare Other | Admitting: Anesthesiology

## 2021-07-25 ENCOUNTER — Other Ambulatory Visit: Payer: Self-pay

## 2021-07-25 ENCOUNTER — Encounter (HOSPITAL_COMMUNITY)
Admission: RE | Disposition: A | Discharge status: Home / Self Care | Payer: Self-pay | Source: Home / Self Care | Attending: Gastroenterology

## 2021-07-25 ENCOUNTER — Ambulatory Visit (HOSPITAL_COMMUNITY)
Admission: RE | Admit: 2021-07-25 | Discharge: 2021-07-25 | Disposition: A | Discharge status: Home / Self Care | Payer: Medicare Other | Attending: Gastroenterology | Admitting: Gastroenterology

## 2021-07-25 DIAGNOSIS — E119 Type 2 diabetes mellitus without complications: Secondary | ICD-10-CM | POA: Insufficient documentation

## 2021-07-25 DIAGNOSIS — I8510 Secondary esophageal varices without bleeding: Secondary | ICD-10-CM | POA: Diagnosis not present

## 2021-07-25 DIAGNOSIS — Z79899 Other long term (current) drug therapy: Secondary | ICD-10-CM | POA: Diagnosis not present

## 2021-07-25 DIAGNOSIS — T50905A Adverse effect of unspecified drugs, medicaments and biological substances, initial encounter: Secondary | ICD-10-CM | POA: Diagnosis not present

## 2021-07-25 DIAGNOSIS — K668 Other specified disorders of peritoneum: Secondary | ICD-10-CM | POA: Diagnosis not present

## 2021-07-25 DIAGNOSIS — K259 Gastric ulcer, unspecified as acute or chronic, without hemorrhage or perforation: Secondary | ICD-10-CM | POA: Insufficient documentation

## 2021-07-25 DIAGNOSIS — Z87891 Personal history of nicotine dependence: Secondary | ICD-10-CM | POA: Diagnosis not present

## 2021-07-25 DIAGNOSIS — Z8543 Personal history of malignant neoplasm of ovary: Secondary | ICD-10-CM | POA: Insufficient documentation

## 2021-07-25 DIAGNOSIS — Z794 Long term (current) use of insulin: Secondary | ICD-10-CM | POA: Insufficient documentation

## 2021-07-25 DIAGNOSIS — Z09 Encounter for follow-up examination after completed treatment for conditions other than malignant neoplasm: Secondary | ICD-10-CM | POA: Diagnosis present

## 2021-07-25 DIAGNOSIS — I509 Heart failure, unspecified: Secondary | ICD-10-CM | POA: Insufficient documentation

## 2021-07-25 DIAGNOSIS — R112 Nausea with vomiting, unspecified: Secondary | ICD-10-CM | POA: Diagnosis not present

## 2021-07-25 DIAGNOSIS — I252 Old myocardial infarction: Secondary | ICD-10-CM | POA: Diagnosis not present

## 2021-07-25 DIAGNOSIS — K5900 Constipation, unspecified: Secondary | ICD-10-CM | POA: Insufficient documentation

## 2021-07-25 DIAGNOSIS — Z7985 Long-term (current) use of injectable non-insulin antidiabetic drugs: Secondary | ICD-10-CM | POA: Insufficient documentation

## 2021-07-25 DIAGNOSIS — K766 Portal hypertension: Secondary | ICD-10-CM | POA: Insufficient documentation

## 2021-07-25 DIAGNOSIS — K7460 Unspecified cirrhosis of liver: Secondary | ICD-10-CM | POA: Diagnosis not present

## 2021-07-25 DIAGNOSIS — K7581 Nonalcoholic steatohepatitis (NASH): Secondary | ICD-10-CM | POA: Insufficient documentation

## 2021-07-25 DIAGNOSIS — I110 Hypertensive heart disease with heart failure: Secondary | ICD-10-CM | POA: Insufficient documentation

## 2021-07-25 DIAGNOSIS — K3189 Other diseases of stomach and duodenum: Secondary | ICD-10-CM

## 2021-07-25 HISTORY — PX: ESOPHAGOGASTRODUODENOSCOPY (EGD) WITH PROPOFOL: SHX5813

## 2021-07-25 LAB — GLUCOSE, CAPILLARY
Glucose-Capillary: 68 mg/dL — ABNORMAL LOW (ref 70–99)
Glucose-Capillary: 118 mg/dL — ABNORMAL HIGH (ref 70–99)

## 2021-07-25 SURGERY — ESOPHAGOGASTRODUODENOSCOPY (EGD) WITH PROPOFOL
Anesthesia: Monitor Anesthesia Care

## 2021-07-25 MED ORDER — LIDOCAINE 2% (20 MG/ML) 5 ML SYRINGE
INTRAMUSCULAR | Status: DC | PRN
  Administered 2021-07-25: 100 mg via INTRAVENOUS

## 2021-07-25 MED ORDER — SODIUM CHLORIDE 0.9 % IV SOLN: INTRAVENOUS | Status: DC

## 2021-07-25 MED ORDER — PROPOFOL 500 MG/50ML IV EMUL
INTRAVENOUS | Status: DC | PRN
  Administered 2021-07-25: 125 ug/kg/min via INTRAVENOUS

## 2021-07-25 MED ORDER — PROPOFOL 10 MG/ML IV BOLUS
INTRAVENOUS | Status: DC | PRN
  Administered 2021-07-25 (×2): 20 mg via INTRAVENOUS

## 2021-07-25 MED ORDER — PROPOFOL 1000 MG/100ML IV EMUL
INTRAVENOUS | Status: AC
  Filled 2021-07-25: qty 100

## 2021-07-25 MED ORDER — LACTATED RINGERS IV SOLN
INTRAVENOUS | Status: AC | PRN
  Administered 2021-07-25: 1000 mL via INTRAVENOUS

## 2021-07-25 MED ORDER — DEXTROSE 50 % IV SOLN
12.5000 g | INTRAVENOUS | Status: AC
  Administered 2021-07-25: 12.5 g via INTRAVENOUS

## 2021-07-25 MED ORDER — DEXTROSE 50 % IV SOLN
INTRAVENOUS | Status: AC
  Filled 2021-07-25: qty 50

## 2021-07-25 SURGICAL SUPPLY — 15 items

## 2021-07-25 NOTE — Op Note (Signed)
[facility] Patient Name: Olivia Benson Procedure Date: 07/25/2021 MRN: [id number] Attending MD: Ronnette Juniper , MD Date of Birth: September 26, 1945 CSN: [id number] Age: 76 Admit Type: Outpatient Procedure:                Upper GI endoscopy Indications:              Follow-up of esophageal varices Providers:                Ronnette Juniper, MD, Allayne Gitelman, RN, William Dalton,                            Technician, Darliss Cheney, Technician Referring MD:             Fuller Plan Medicines:                Monitored Anesthesia Care Complications:            No immediate complications. Estimated blood loss:                            Minimal. Estimated Blood Loss:     Estimated blood loss was minimal. Procedure:                Pre-Anesthesia Assessment:                           - Prior to the procedure, a History and Physical                            was performed, and patient medications and                            allergies were reviewed. The patient's tolerance of                            previous anesthesia was also reviewed. The risks                            and benefits of the procedure and the sedation                            options and risks were discussed with the patient.                            All questions were answered, and informed consent                            was obtained. Prior Anticoagulants: The patient has                            taken no previous anticoagulant or antiplatelet                            agents. ASA Grade Assessment: III - A patient with  severe systemic disease. After reviewing the risks                            and benefits, the patient was deemed in                            satisfactory condition to undergo the procedure.                           After obtaining informed consent, the endoscope was                            passed under direct vision. Throughout the                             procedure, the patient's blood pressure, pulse, and                            oxygen saturations were monitored continuously. The                            GIF-H190 ([id number]) Olympus endoscope was introduced                            through the mouth, and advanced to the second part                            of duodenum. The upper GI endoscopy was                            accomplished without difficulty. The patient                            tolerated the procedure well. Scope In: Scope Out: Findings:      Grade I varices were found in the middle third of the esophagus and in       the lower third of the esophagus. They were small in size. Scarring       noted from previous banding.      The Z-line was regular and was found 35 cm from the incisors.      Moderate portal hypertensive gastropathy was found in the stomach.      Easy friability and self limited mucosal bleeding noted with passage of       scope.      The cardia and gastric fundus were normal on retroflexion. There was no       evidence of gastric varices.      Few non-bleeding superficial gastric ulcers with a clean ulcer base       (Forrest Class III) were found in the gastric antrum.      The examined duodenum was normal. Impression:               - Grade I esophageal varices.                           - Z-line regular, 35 cm from the  incisors.                           - Portal hypertensive gastropathy.                           - Non-bleeding gastric ulcers with a clean ulcer                            base (Forrest Class III).                           - Normal examined duodenum.                           - No specimens collected. Moderate Sedation:      Patient did not receive moderate sedation for this procedure, but       instead received monitored anesthesia care. Recommendation:           - Patient has a contact number available for                            emergencies. The signs and symptoms of  potential                            delayed complications were discussed with the                            patient. Return to normal activities tomorrow.                            Written discharge instructions were provided to the                            patient.                           - Resume regular diet.                           - Continue present medications.                           - Increase pantoprazole from 40 mg once a day to                            twice a day for 2 months.                           - Repeat upper endoscopy in 3 years for                            surveillance. Procedure Code(s):        --- Professional ---                           [phone], Esophagogastroduodenoscopy, flexible,  transoral; diagnostic, including collection of                            specimen(s) by brushing or washing, when performed                            (separate procedure) Diagnosis Code(s):        --- Professional ---                           I85.00, Esophageal varices without bleeding                           K76.6, Portal hypertension                           K31.89, Other diseases of stomach and duodenum                           K25.9, Gastric ulcer, unspecified as acute or                            chronic, without hemorrhage or perforation CPT copyright 2019 [employer]. All rights reserved. The codes documented in this report are preliminary and upon coder review may  be revised to meet current compliance requirements. Ronnette Juniper, MD 07/25/2021 11:27:29 AM This report has been signed electronically. Number of Addenda: 0

## 2021-07-25 NOTE — Anesthesia Procedure Notes (Signed)
Date/Time: 07/25/2021 11:15 AM  Performed by: Sharlette Dense, CRNAOxygen Delivery Method: Simple face mask

## 2021-07-25 NOTE — Anesthesia Postprocedure Evaluation (Signed)
Anesthesia Post Note  Patient: Olivia Benson  Procedure(s) Performed: ESOPHAGOGASTRODUODENOSCOPY (EGD) WITH PROPOFOL     Patient location during evaluation: Endoscopy Anesthesia Type: MAC Level of consciousness: awake and alert Pain management: pain level controlled Vital Signs Assessment: post-procedure vital signs reviewed and stable Respiratory status: spontaneous breathing, nonlabored ventilation, respiratory function stable and patient connected to nasal cannula oxygen Cardiovascular status: blood pressure returned to baseline and stable Postop Assessment: no apparent nausea or vomiting Anesthetic complications: no   No notable events documented.  Last Vitals:  Vitals:   07/25/21 1043  BP: 110/69  Pulse: 70  Resp: 20  Temp: 36.7 C  SpO2: 98%    Last Pain:  Vitals:   07/25/21 1043  TempSrc: Temporal  PainSc: 0-No pain                 Barnet Glasgow

## 2021-07-25 NOTE — Interval H&P Note (Signed)
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  07/25/2021 11:05 AM  [name]  has presented today for EGD with banding, with the diagnosis of Hx of esophageal varices.  The various methods of treatment have been discussed with the patient and family. After consideration of risks, benefits and other options for treatment, the patient has consented to  Procedure(s): ESOPHAGOGASTRODUODENOSCOPY (EGD) WITH PROPOFOL (N/A) GASTRIC VARICES BANDING (N/A) as a surgical intervention.  The patient's history has been reviewed, patient examined, no change in status, stable for surgery.  I have reviewed the patient's chart and labs.  Questions were answered to the patient's satisfaction.     Ronnette Juniper

## 2021-07-25 NOTE — Transfer of Care (Signed)
Immediate Anesthesia Transfer of Care Note  Patient: ADRIONA KANEY  Procedure(s) Performed: ESOPHAGOGASTRODUODENOSCOPY (EGD) WITH PROPOFOL  Patient Location: Endoscopy Unit  Anesthesia Type:MAC  Level of Consciousness: drowsy  Airway & Oxygen Therapy: Patient Spontanous Breathing and Patient connected to face mask oxygen  Post-op Assessment: Report given to RN and Post -op Vital signs reviewed and stable  Post vital signs: Reviewed and stable  Last Vitals:  Vitals Value Taken Time  BP 117/59 07/25/21 1130  Temp    Pulse 71 07/25/21 1129  Resp 18 07/25/21 1129  SpO2 100 % 07/25/21 1129  Vitals shown include unvalidated device data.  Last Pain:  Vitals:   07/25/21 1043  TempSrc: Temporal  PainSc: 0-No pain         Complications: No notable events documented.

## 2021-07-29 ENCOUNTER — Encounter (HOSPITAL_COMMUNITY): Payer: Self-pay | Admitting: Gastroenterology

## 2021-08-07 ENCOUNTER — Encounter: Payer: Self-pay | Admitting: Hematology and Oncology

## 2021-08-07 ENCOUNTER — Inpatient Hospital Stay: Payer: Medicare Other

## 2021-08-07 ENCOUNTER — Inpatient Hospital Stay (HOSPITAL_BASED_OUTPATIENT_CLINIC_OR_DEPARTMENT_OTHER): Payer: Medicare Other | Admitting: Hematology and Oncology

## 2021-08-07 ENCOUNTER — Other Ambulatory Visit: Payer: Self-pay

## 2021-08-07 VITALS — BP 121/69 | HR 72 | Temp 97.9°F | Resp 18

## 2021-08-07 DIAGNOSIS — Z7189 Other specified counseling: Secondary | ICD-10-CM

## 2021-08-07 DIAGNOSIS — Z5111 Encounter for antineoplastic chemotherapy: Secondary | ICD-10-CM | POA: Diagnosis not present

## 2021-08-07 DIAGNOSIS — K7581 Nonalcoholic steatohepatitis (NASH): Secondary | ICD-10-CM

## 2021-08-07 DIAGNOSIS — C482 Malignant neoplasm of peritoneum, unspecified: Secondary | ICD-10-CM

## 2021-08-07 DIAGNOSIS — K7460 Unspecified cirrhosis of liver: Secondary | ICD-10-CM

## 2021-08-07 DIAGNOSIS — D61818 Other pancytopenia: Secondary | ICD-10-CM

## 2021-08-07 LAB — CBC WITH DIFFERENTIAL (CANCER CENTER ONLY)
Abs Immature Granulocytes: 0.01 10*3/uL (ref 0.00–0.07)
Basophils Absolute: 0 10*3/uL (ref 0.0–0.1)
Basophils Relative: 1 %
Eosinophils Absolute: 0 10*3/uL (ref 0.0–0.5)
Eosinophils Relative: 0 %
HCT: 34.6 % — ABNORMAL LOW (ref 36.0–46.0)
Hemoglobin: 11.6 g/dL — ABNORMAL LOW (ref 12.0–15.0)
Immature Granulocytes: 0 %
Lymphocytes Relative: 18 %
Lymphs Abs: 0.9 10*3/uL (ref 0.7–4.0)
MCH: 31.9 pg (ref 26.0–34.0)
MCHC: 33.5 g/dL (ref 30.0–36.0)
MCV: 95.1 fL (ref 80.0–100.0)
Monocytes Absolute: 0.3 10*3/uL (ref 0.1–1.0)
Monocytes Relative: 6 %
Neutro Abs: 3.8 10*3/uL (ref 1.7–7.7)
Neutrophils Relative %: 75 %
Platelet Count: 99 10*3/uL — ABNORMAL LOW (ref 150–400)
RBC: 3.64 MIL/uL — ABNORMAL LOW (ref 3.87–5.11)
RDW: 15.7 % — ABNORMAL HIGH (ref 11.5–15.5)
WBC Count: 5 10*3/uL (ref 4.0–10.5)
nRBC: 0 % (ref 0.0–0.2)

## 2021-08-07 LAB — CMP (CANCER CENTER ONLY)
ALT: 17 U/L (ref 0–44)
AST: 20 U/L (ref 15–41)
Albumin: 4 g/dL (ref 3.5–5.0)
Alkaline Phosphatase: 86 U/L (ref 38–126)
Anion gap: 7 (ref 5–15)
BUN: 21 mg/dL (ref 8–23)
CO2: 25 mmol/L (ref 22–32)
Calcium: 9 mg/dL (ref 8.9–10.3)
Chloride: 110 mmol/L (ref 98–111)
Creatinine: 1.02 mg/dL — ABNORMAL HIGH (ref 0.44–1.00)
GFR, Estimated: 58 mL/min — ABNORMAL LOW (ref >60)
Glucose, Bld: 103 mg/dL — ABNORMAL HIGH (ref 70–99)
Potassium: 3.8 mmol/L (ref 3.5–5.1)
Sodium: 142 mmol/L (ref 135–145)
Total Bilirubin: 0.5 mg/dL (ref 0.3–1.2)
Total Protein: 6.9 g/dL (ref 6.5–8.1)

## 2021-08-07 MED ORDER — SODIUM CHLORIDE 0.9 % IV SOLN
10.0000 mg | Freq: Once | INTRAVENOUS | Status: AC
  Administered 2021-08-07: 10 mg via INTRAVENOUS
  Filled 2021-08-07: qty 10

## 2021-08-07 MED ORDER — SODIUM CHLORIDE 0.9 % IV SOLN
37.5000 mg/m2 | Freq: Once | INTRAVENOUS | Status: AC
  Administered 2021-08-07: 70 mg via INTRAVENOUS
  Filled 2021-08-07: qty 7

## 2021-08-07 MED ORDER — PEGFILGRASTIM 6 MG/0.6ML ~~LOC~~ PSKT
6.0000 mg | PREFILLED_SYRINGE | Freq: Once | SUBCUTANEOUS | Status: AC
  Administered 2021-08-07: 6 mg via SUBCUTANEOUS
  Filled 2021-08-07: qty 0.6

## 2021-08-07 MED ORDER — SODIUM CHLORIDE 0.9 % IV SOLN: Freq: Once | INTRAVENOUS | Status: AC

## 2021-08-07 MED ORDER — SODIUM CHLORIDE 0.9% FLUSH
10.0000 mL | INTRAVENOUS | Status: DC | PRN
  Administered 2021-08-07: 10 mL

## 2021-08-07 MED ORDER — HEPARIN SOD (PORK) LOCK FLUSH 100 UNIT/ML IV SOLN
500.0000 [IU] | Freq: Once | INTRAVENOUS | Status: AC | PRN
  Administered 2021-08-07: 500 [IU]

## 2021-08-07 MED ORDER — SODIUM CHLORIDE 0.9% FLUSH
10.0000 mL | Freq: Once | INTRAVENOUS | Status: AC
  Administered 2021-08-07: 10 mL

## 2021-08-08 LAB — CA 125: Cancer Antigen (CA) 125: 44.1 U/mL — ABNORMAL HIGH (ref 0.0–38.1)

## 2021-08-27 MED FILL — Dexamethasone Sodium Phosphate Inj 100 MG/10ML: INTRAMUSCULAR | Qty: 1 | Status: AC

## 2021-08-28 ENCOUNTER — Encounter: Payer: Self-pay | Admitting: Hematology and Oncology

## 2021-08-28 ENCOUNTER — Inpatient Hospital Stay (HOSPITAL_BASED_OUTPATIENT_CLINIC_OR_DEPARTMENT_OTHER): Payer: Medicare Other | Admitting: Hematology and Oncology

## 2021-08-28 ENCOUNTER — Other Ambulatory Visit: Payer: Self-pay

## 2021-08-28 ENCOUNTER — Inpatient Hospital Stay: Payer: Medicare Other | Attending: Hematology and Oncology

## 2021-08-28 ENCOUNTER — Inpatient Hospital Stay: Payer: Medicare Other

## 2021-08-28 DIAGNOSIS — C786 Secondary malignant neoplasm of retroperitoneum and peritoneum: Secondary | ICD-10-CM | POA: Diagnosis present

## 2021-08-28 DIAGNOSIS — Z5111 Encounter for antineoplastic chemotherapy: Secondary | ICD-10-CM | POA: Diagnosis not present

## 2021-08-28 DIAGNOSIS — M545 Low back pain, unspecified: Secondary | ICD-10-CM | POA: Insufficient documentation

## 2021-08-28 DIAGNOSIS — C563 Malignant neoplasm of bilateral ovaries: Secondary | ICD-10-CM | POA: Insufficient documentation

## 2021-08-28 DIAGNOSIS — Z79899 Other long term (current) drug therapy: Secondary | ICD-10-CM | POA: Insufficient documentation

## 2021-08-28 DIAGNOSIS — Z7952 Long term (current) use of systemic steroids: Secondary | ICD-10-CM | POA: Diagnosis not present

## 2021-08-28 DIAGNOSIS — D61818 Other pancytopenia: Secondary | ICD-10-CM

## 2021-08-28 DIAGNOSIS — G8929 Other chronic pain: Secondary | ICD-10-CM

## 2021-08-28 DIAGNOSIS — Z5189 Encounter for other specified aftercare: Secondary | ICD-10-CM | POA: Diagnosis not present

## 2021-08-28 DIAGNOSIS — C482 Malignant neoplasm of peritoneum, unspecified: Secondary | ICD-10-CM

## 2021-08-28 DIAGNOSIS — Z7189 Other specified counseling: Secondary | ICD-10-CM

## 2021-08-28 LAB — CBC WITH DIFFERENTIAL (CANCER CENTER ONLY)
Abs Immature Granulocytes: 0.01 10*3/uL (ref 0.00–0.07)
Basophils Absolute: 0 10*3/uL (ref 0.0–0.1)
Basophils Relative: 0 %
Eosinophils Absolute: 0 10*3/uL (ref 0.0–0.5)
Eosinophils Relative: 0 %
HCT: 34 % — ABNORMAL LOW (ref 36.0–46.0)
Hemoglobin: 11.3 g/dL — ABNORMAL LOW (ref 12.0–15.0)
Immature Granulocytes: 0 %
Lymphocytes Relative: 22 %
Lymphs Abs: 1 10*3/uL (ref 0.7–4.0)
MCH: 31.9 pg (ref 26.0–34.0)
MCHC: 33.2 g/dL (ref 30.0–36.0)
MCV: 96 fL (ref 80.0–100.0)
Monocytes Absolute: 0.3 10*3/uL (ref 0.1–1.0)
Monocytes Relative: 6 %
Neutro Abs: 3.4 10*3/uL (ref 1.7–7.7)
Neutrophils Relative %: 72 %
Platelet Count: 90 10*3/uL — ABNORMAL LOW (ref 150–400)
RBC: 3.54 MIL/uL — ABNORMAL LOW (ref 3.87–5.11)
RDW: 15.5 % (ref 11.5–15.5)
WBC Count: 4.8 10*3/uL (ref 4.0–10.5)
nRBC: 0 % (ref 0.0–0.2)

## 2021-08-28 LAB — CMP (CANCER CENTER ONLY)
ALT: 17 U/L (ref 0–44)
AST: 18 U/L (ref 15–41)
Albumin: 4 g/dL (ref 3.5–5.0)
Alkaline Phosphatase: 80 U/L (ref 38–126)
Anion gap: 7 (ref 5–15)
BUN: 19 mg/dL (ref 8–23)
CO2: 26 mmol/L (ref 22–32)
Calcium: 9.2 mg/dL (ref 8.9–10.3)
Chloride: 109 mmol/L (ref 98–111)
Creatinine: 0.82 mg/dL (ref 0.44–1.00)
GFR, Estimated: >60 mL/min (ref >60)
Glucose, Bld: 75 mg/dL (ref 70–99)
Potassium: 3.7 mmol/L (ref 3.5–5.1)
Sodium: 142 mmol/L (ref 135–145)
Total Bilirubin: 0.5 mg/dL (ref 0.3–1.2)
Total Protein: 7 g/dL (ref 6.5–8.1)

## 2021-08-28 MED ORDER — HEPARIN SOD (PORK) LOCK FLUSH 100 UNIT/ML IV SOLN
500.0000 [IU] | Freq: Once | INTRAVENOUS | Status: AC | PRN
  Administered 2021-08-28: 500 [IU]

## 2021-08-28 MED ORDER — HYDROCODONE-ACETAMINOPHEN 5-325 MG PO TABS: 1.0000 | ORAL_TABLET | Freq: Four times a day (QID) | ORAL | 0 refills | Status: DC | PRN

## 2021-08-28 MED ORDER — SODIUM CHLORIDE 0.9 % IV SOLN: Freq: Once | INTRAVENOUS | Status: AC

## 2021-08-28 MED ORDER — SODIUM CHLORIDE 0.9% FLUSH
10.0000 mL | INTRAVENOUS | Status: DC | PRN
  Administered 2021-08-28: 10 mL

## 2021-08-28 MED ORDER — ONDANSETRON HCL 8 MG PO TABS: ORAL_TABLET | ORAL | 1 refills | Status: DC

## 2021-08-28 MED ORDER — SODIUM CHLORIDE 0.9 % IV SOLN
37.5000 mg/m2 | Freq: Once | INTRAVENOUS | Status: AC
  Administered 2021-08-28: 70 mg via INTRAVENOUS
  Filled 2021-08-28: qty 7

## 2021-08-28 MED ORDER — SODIUM CHLORIDE 0.9 % IV SOLN
10.0000 mg | Freq: Once | INTRAVENOUS | Status: AC
  Administered 2021-08-28: 10 mg via INTRAVENOUS
  Filled 2021-08-28: qty 10

## 2021-08-28 MED ORDER — PEGFILGRASTIM 6 MG/0.6ML ~~LOC~~ PSKT
6.0000 mg | PREFILLED_SYRINGE | Freq: Once | SUBCUTANEOUS | Status: AC
  Administered 2021-08-28: 6 mg via SUBCUTANEOUS
  Filled 2021-08-28: qty 0.6

## 2021-08-28 MED ORDER — SODIUM CHLORIDE 0.9% FLUSH
10.0000 mL | Freq: Once | INTRAVENOUS | Status: AC
  Administered 2021-08-28: 10 mL

## 2021-08-28 NOTE — Assessment & Plan Note (Signed)
So far, she tolerated treatment well except for persistent pancytopenia from treatment She denies worsening peripheral neuropathy Recent imaging study early June showed positive response to treatment We will proceed with treatment as scheduled Recommend a few more cycles of treatment before repeat CT imaging again in September

## 2021-08-28 NOTE — Assessment & Plan Note (Signed)
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  Her mild anemia is likely due to chemotherapy

## 2021-08-28 NOTE — Assessment & Plan Note (Signed)
She has chronic lower back pain, stable on prescribed hydrocodone I refilled her prescription today

## 2021-08-28 NOTE — Progress Notes (Signed)
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[facility]) So far, she tolerated treatment well except for persistent pancytopenia from treatment She denies worsening peripheral neuropathy Recent imaging study early June showed positive response to treatment We will proceed with treatment as scheduled Recommend a few more cycles of treatment before repeat CT imaging again in September  Pancytopenia, acquired [facility]) 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  Her mild anemia is likely due to chemotherapy  Low back pain She has chronic lower back pain, stable on prescribed hydrocodone I refilled her prescription today  No orders of the defined types were placed in this encounter.   All questions were answered. The patient knows to call the clinic with any problems, questions or concerns. The total time spent in the appointment was 20 minutes encounter with patients including review of chart and various tests results, discussions about plan of care and coordination of care plan   [name], MD 08/28/2021 9:04 AM  INTERVAL HISTORY: Please see below for problem oriented charting. she returns for treatment follow-up on single agent Taxotere She tolerated the last cycle of treatment well Chronic pain is stable She denies worsening neuropathy  REVIEW OF SYSTEMS:   Constitutional: Denies fevers, chills or abnormal weight loss Eyes: Denies blurriness of vision Ears, nose, mouth, throat, and face: Denies mucositis or sore throat Respiratory: Denies cough, dyspnea or wheezes Cardiovascular:  Denies palpitation, chest discomfort or lower extremity swelling Gastrointestinal:  Denies nausea, heartburn or change in bowel habits Skin: Denies abnormal skin rashes Lymphatics: Denies new lymphadenopathy or easy bruising Behavioral/Psych: Mood is stable, no new changes  All other systems were reviewed with the patient and are negative.  I have reviewed the past medical history, past surgical history, social history and family history with the patient and they are unchanged from previous note.  ALLERGIES:  is allergic to carboplatin, shellfish allergy, vancomycin, benadryl [diphenhydramine hcl], penicillins, and tape.  MEDICATIONS:  Current Outpatient Medications  Medication Sig Dispense Refill   calcium elemental as carbonate (BARIATRIC TUMS ULTRA) 400 MG chewable tablet Chew 1 tablet by mouth 2 (two) times daily.     Cholecalciferol (VITAMIN D) 50 MCG (2000 UT) tablet Take 2,000 Units by mouth at bedtime.     dexamethasone (DECADRON) 4 MG tablet Take 1 tablet the day before chemo and daily after chemo for 2 days. Please dispense 18 tabs for all 6 cycles of treatment 18 tablet 1   gabapentin (NEURONTIN) 300 MG capsule Take 3 capsules (900 mg total) by mouth 2 (two) times daily. 120 capsule 11   HYDROcodone-acetaminophen (NORCO/VICODIN) 5-325 MG tablet Take 1 tablet by mouth every 6 (six) hours as needed for moderate pain. 60 tablet 0   insulin degludec (TRESIBA FLEXTOUCH) 100 UNIT/ML FlexTouch Pen Inject 28 Units into the skin daily.     lidocaine-prilocaine (EMLA) cream Apply to Porta-cath  1-2 hours prior to access as directed. (Patient taking differently: Apply 1 application. topically daily as needed (port access).) 30 g 9   ondansetron (ZOFRAN) 8 MG tablet TAKE 1 TABLET(8 MG) BY MOUTH EVERY 8 HOURS AS NEEDED 60 tablet  1   OZEMPIC, 0.25 OR 0.5 MG/DOSE, 2 MG/1.5ML SOPN Inject 0.25 mg into the skin every Saturday.     pantoprazole (PROTONIX) 40 MG tablet Take 1 tablet (40 mg total) by  mouth daily.     rotigotine (NEUPRO) 4 MG/24HR APPLY 1 PATCH TOPICALLY TO THE SKIN DAILY 90 patch 3   No current facility-administered medications for this visit.   Facility-Administered Medications Ordered in Other Visits  Medication Dose Route Frequency Provider Last Rate Last Admin   DOCEtaxel (TAXOTERE) 70 mg in sodium chloride 0.9 % 150 mL chemo infusion  37.5 mg/m2 (Treatment Plan Recorded) Intravenous Once Alvy Bimler, Brand Siever, MD       heparin lock flush 100 unit/mL  500 Units Intracatheter Once PRN Alvy Bimler, Florene Brill, MD       pegfilgrastim (NEULASTA ONPRO KIT) injection 6 mg  6 mg Subcutaneous Once Yatzil Clippinger, MD       sodium chloride flush (NS) 0.9 % injection 10 mL  10 mL Intracatheter PRN [name], MD        SUMMARY OF ONCOLOGIC HISTORY: Oncology History Overview Note  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 Had allergic reaction to carboplatin. Avastin was stopped due to GI hemorrhage   Primary peritoneal carcinomatosis ([place])  09/06/2011 Imaging   CT findings consistent with cirrhotic changes involving the liver.  No worrisome liver mass.  There are associated portal venous collaterals and splenomegaly consistent with portal venous hypertension. 2.  No other significant upper abdominal findings   11/08/2013 Imaging   US showed ascites   11/08/2013 Initial Diagnosis   Patient had ~ 2 months of some urinary incontinence, saw Dr Ronita Hipps in 10-2013, with pelvic mass on exam    11/12/2013 Imaging   There are findings of progressive hepatic cirrhosis with a large amount of ascites and mild splenomegaly. There are venous collaterals present. 2. There is abnormal thickening of the peritoneal surface along the left mid and lower abdominal wall. There is abnormal soft tissue density in the pelvis as well which could reflect the clinically suspected ovarian malignancy but the findings are nondiagnostic. 3. There is a new small left pleural effusion. 4. There  is no acute bowel abnormality.   11/18/2013 Imaging   Successful ultrasound guided diagnostic and therapeutic paracentesis yielding 4.1 liters of ascites.     11/18/2013 Pathology Results   PERITONEAL/ASCITIC FLUID (SPECIMEN 1 OF 1 COLLECTED 11/18/13): SEROUS CARCINOMA, PLEASE SEE COMMENT.   12/01/2013 Tumor Marker   Patient's tumor was tested for the following markers: CA125 Results of the tumor marker test revealed 1938   12/03/2013 Imaging   Successful ultrasound-guided therapeutic paracentesis yielding 3.6 liters of peritoneal fluid.   12/07/2013 - 05/30/2014 Chemotherapy   She received 3 cycles of carboplatin and Taxol, interrupted cycle 4 for surgery.  She subsequently completed 3 more cycles of chemotherapy after surgery   12/20/2013 Imaging   Successful ultrasound-guided diagnostic and therapeutic paracentesis yielding 2.2 liters of peritoneal fluid   12/20/2013 Pathology Results   PERITONEAL/ASCITIC FLUID(SPECIMEN 1 OF 1 COLLECTED 12/20/13): MALIGNANT CELLS CONSISTENT WITH SEROUS CARCINOMA   01/13/2014 Tumor Marker   Patient's tumor was tested for the following markers: CA125 Results of the tumor marker test revealed 227   02/07/2014 Tumor Marker   Patient's tumor was tested for the following markers: CA125 Results of the tumor marker test revealed 130   02/15/2014 Genetic Testing   Genetics testing from 02-2014 normal ([employer] panel)   02/23/2014 Imaging   Interval  decrease in ascites. 2. Morphologic changes in the liver consistent with cirrhosis. 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 Portal vein remains patent at this time. 3. Persistent but decreased abnormal soft tissue attenuation tracking in the omental fat. This may be secondary to interval improvement in metastatic disease. 4. Peritoneal thickening seen in the left abdomen and pelvis on the previous study has decreased and nearly resolved in the interval. This also suggests interval  improvement in metastatic disease.     03/07/2014 Procedure   Ultrasound and fluoroscopically guided right internal jugular single lumen power port catheter insertion. Tip in the SVC/RA junction. Catheter ready for use.   03/17/2014 Tumor Marker   Patient's tumor was tested for the following markers: CA125 Results of the tumor marker test revealed 45   03/29/2014 Pathology Results   1. Omentum, resection for tumor - HIGH GRADE SEROUS CARCINOMA, SEE COMMENT. 2. Ovary and fallopian tube, right - HIGH GRADE SEROUS CARCINOMA, SEE COMMENT. 3. Ovary and fallopian tube, left - HIGH GRADE SEROUS CARCINOMA, SEE COMMENT. Diagnosis Note 1. Nests and clusters of malignant cells are invading the omental tissue (part #1) with associated fibrosis. The cells are pleomorphic with prominent nucleoli. There are scattered psammoma bodies. The ovaries are atrophic and exhibit multiple foci of surface based invasive carcinoma and associated fibrosis. The fallopian tubes have a few foci of carcinoma, also superficially located. There are no precursor lesions noted in the ovaries or fallopian tubes (the entire tubes and ovaries were submitted for evaluation). While there is some retraction artifact, there are several foci suspicious for lymphovascular invasion. 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 Given the fibrosis, there does appear to be a small amount of treatment effect, however, there is abundant residual tumor.    03/29/2014 Surgery   Procedure(s) Performed: 1. Exploratory laparotomy with bilateral salpingo-oophorectomy, omentectomy radical tumor debulking for ovarian cancer .   Surgeon: Thereasa Solo, MD.   Assistant Surgeon: Lahoma Crocker, M.D. Assistant: (an MD assistant was necessary for tissue manipulation, retraction and positioning due to the complexity of the  case and hospital policies).  Operative Findings: 10cm omental cake from hepatic to splenic flexure, densely adherent to transverse colon. Milliary studding of tumor implants (<50m, too numerous in number to count) adherent to the mesentery of the small bowel and small bowel wall. Small volume (100cc) ascites. Small ovaries bilaterally, densely adherent to the pelvic cul de sac. Left ovary and tube densely adherent to sigmoid colon. Cirrhotic liver with hepatomegaly and splenomegaly.     This represented an optimal cytoreduction (R1) with visible disease residual on the bowel wall and mesentery (millial, <372mimplants).      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 2. 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     08/18/2014 Tumor Marker   Patient's tumor was tested for the following markers: CA125 Results of the  tumor marker test revealed 16   10/13/2014 Tumor Marker   Patient's tumor was tested for the following markers: CA125 Results of the tumor marker test revealed 14   11/21/2014 Tumor Marker   Patient's tumor was tested for the following markers: CA125 Results of the tumor marker test revealed 16   12/28/2014 Tumor Marker   Patient's tumor was tested for the following markers: CA125 Results of the tumor marker test revealed 762   02/27/2015 Tumor Marker   Patient's tumor was tested for the following markers: CA125 Results of the tumor marker test revealed 20.2   03/22/2015 Imaging   Filler  appearance to the prior exam, with very subtle fluid tracking along portions of the liver, and very minimal nodularity along the right paracolic gutter representing residua from the prior peritoneal cancer. The current abnormalities could simply be post therapy findings rather than necessarily representing residual malignancy. No new or enlarging lesions are identified. 2. Hepatic cirrhosis and splenomegaly. There is some gastric varices suggesting portal venous hypertension. 3. Left foraminal stenosis at L4-5 due to spurring. There is likely also some central narrowing of the thecal sac at this level. 4. Bibasilar scarring. 5. Chronic mild left mid kidney scarring   03/27/2015 Tumor Marker   Patient's tumor was tested for the following markers: CA125 Results of the tumor marker test revealed 25.3   04/24/2015 Imaging   Disc bulge L2-3 with mild spinal stenosis and right foraminal encroachment. 2. Disc bulge L3-4 with mild spinal stenosis. 3. Multifactorial spinal, left lateral recess and foraminal stenosis L4-5. There is grade 1 anterolisthesis without evident dynamic instability.   05/29/2015 Tumor Marker   Patient's tumor was tested for the following markers: CA125 Results of the tumor marker test revealed 29.9   08/21/2015 Tumor Marker   Patient's tumor was tested for the following markers: CA125 Results of the tumor marker test revealed 45   09/18/2015 Tumor Marker   Patient's tumor was tested for the following markers: CA125 Results of the tumor marker test revealed 47   09/27/2015 Imaging   New small bowel mesenteric nodule and enlarged left external iliac lymph node, highly worrisome for metastatic disease. 2. Cirrhosis and splenomegaly   10/10/2015 Tumor Marker   Patient's tumor was tested for the following markers: CA125 Results of the tumor marker test revealed 53.4   10/10/2015 - 12/11/2015 Chemotherapy   She received 4 cycles of carboplatin only   11/20/2015 Tumor Marker    Patient's tumor was tested for the following markers: CA125 Results of the tumor marker test revealed 41.3   12/20/2015 Imaging   CT: Mild left external iliac lymphadenopathy is slightly decreased. Mildly enlarged lower mesenteric nodule is slightly decreased. 2. No new or progressive metastatic disease in the abdomen or pelvis. 3. Cirrhosis. Stable subcentimeter hypodense left liver lobe lesion. No new liver lesions. 4. Stable mild splenomegaly.  No ascites. 5. Mild sigmoid diverticulosis.   01/01/2016 -  Anti-estrogen oral therapy   She was placed on tamoxifen. Had letrozole initially but switched to tamoxifen due to poor tolerance   01/15/2016 Tumor Marker   Patient's tumor was tested for the following markers: CA125 Results of the tumor marker test revealed 31.4   02/19/2016 Tumor Marker   Patient's tumor was tested for the following markers: CA125 Results of the tumor marker test revealed 36.5   05/13/2016 Imaging   No evidence of disease progression within the abdomen or pelvis. Previously noted small central mesenteric nodule and left external iliac node  appear slightly smaller. 2. Stable changes of hepatic cirrhosis and portal hypertension with associated splenomegaly. No new or enlarging hepatic lesions are identified. 3. No acute findings.   05/13/2016 Tumor Marker   Patient's tumor was tested for the following markers: CA125 Results of the tumor marker test revealed 41.2   06/24/2016 Tumor Marker   Patient's tumor was tested for the following markers: CA125 Results of the tumor marker test revealed 47.3   08/20/2016 Imaging   1. The left external iliac lymph node is slightly increased in size compared to the prior exam, currently 1.1 cm and previously 0.9 cm in diameter. 2. Stable central mesenteric lymph node at 0.9 cm in diameter. 3. Stable trace free pelvic fluid and stable slight thickening along the right paracolic gutter, without well-defined peritoneal nodularity. 4. Other  imaging findings of potential clinical significance: Subsegmental atelectasis or scarring in the lung bases. Hepatic cirrhosis. Left renal scarring. Mild splenomegaly. Sigmoid colon diverticulosis. Impingement at L4-5 due to spondylosis and degenerative disc disease broad Schmorl' s nodes at L3-L4. Pelvic floor laxity   08/23/2016 Tumor Marker   Patient's tumor was tested for the following markers: CA125 Results of the tumor marker test revealed 80.2   09/04/2016 Imaging   LV EF: 60% -   65%   09/12/2016 Tumor Marker   Patient's tumor was tested for the following markers: CA125 Results of the tumor marker test revealed 98.5   09/12/2016 - 01/29/2018 Chemotherapy   The patient received Doxil and Avastin. Avastin was discontinued in Dec 2019 due to GI hemorrhage   09/26/2016 Tumor Marker   Patient's tumor was tested for the following markers: CA125 Results of the tumor marker test revealed 68.9   10/10/2016 Tumor Marker   Patient's tumor was tested for the following markers: CA125 Results of the tumor marker test revealed 65.6   11/07/2016 Tumor Marker   Patient's tumor was tested for the following markers: CA125 Results of the tumor marker test revealed 46.4   11/29/2016 Imaging   Stable mild peritoneal thickening, suspicious for peritoneal carcinomatosis. No new or progressive disease identified. No evidence of ascites.  No significant change in 10 mm left external iliac and 8 mm mesenteric lymph nodes.  Stable hepatic cirrhosis, and splenomegaly consistent with portal venous hypertension.  Colonic diverticulosis, without radiographic evidence of diverticulitis.   12/02/2016 Imaging   Normal LV size with EF 55%. Basal inferior and basal inferoseptal hypokinesis. Normal RV size and systolic function. No significant valvular abnormalities.   12/05/2016 Tumor Marker   Patient's tumor was tested for the following markers: CA125 Results of the tumor marker test revealed 49.1    01/09/2017 Tumor Marker   Patient's tumor was tested for the following markers: CA125 Results of the tumor marker test revealed 47.2   02/06/2017 Tumor Marker   Patient's tumor was tested for the following markers: CA125 Results of the tumor marker test revealed 44.1   02/18/2017 Imaging   Stable mild peritoneal thickening. No new or progressive disease identified within the abdomen or pelvis.  Stable tiny sub-cm left external iliac and mesenteric lymph nodes.  Stable hepatic cirrhosis, and splenomegaly consistent with portal venous hypertension. No evidence of hepatic neoplasm.  Colonic diverticulosis, without radiographic evidence of diverticulitis.   03/06/2017 Tumor Marker   Patient's tumor was tested for the following markers: CA125 Results of the tumor marker test revealed 50.4   03/17/2017 Imaging   - Left ventricle: The cavity size was normal. Wall thickness was normal. Systolic function  was normal. The estimated ejection fraction was in the range of 55% to 60%. Wall motion was normal; there were no regional wall motion abnormalities. Features are consistent with a pseudonormal left ventricular filling pattern, with concomitant abnormal relaxation and increased filling pressure (grade 2 diastolic dysfunction). - Mitral valve: There was mild regurgitation. - Left atrium: The atrium was mildly dilated   04/03/2017 Tumor Marker   Patient's tumor was tested for the following markers: CA125 Results of the tumor marker test revealed 47.2   05/14/2017 Imaging   CT scan of abdomen and pelvis Stable mild peritoneal thickening. No new or progressive disease within the abdomen or pelvis.  Stable hepatic cirrhosis. Stable splenomegaly, consistent with portal venous hypertension. No evidence of hepatic neoplasm.   05/15/2017 Tumor Marker   Patient's tumor was tested for the following markers: CA125 Results of the tumor marker test revealed 56.1   06/12/2017 Tumor Marker   Patient's tumor  was tested for the following markers: CA125 Results of the tumor marker test revealed 49.2   07/10/2017 Tumor Marker   Patient's tumor was tested for the following markers: CA125 Results of the tumor marker test revealed 46.3   08/07/2017 Tumor Marker   Patient's tumor was tested for the following markers: CA125 Results of the tumor marker test revealed 52.9   08/20/2017 Imaging   1. Mild omental/peritoneal haziness, unchanged. No evidence of new metastatic disease. 2. Cirrhosis with splenomegaly.   09/04/2017 Tumor Marker   Patient's tumor was tested for the following markers: CA125 Results of the tumor marker test revealed 48.5   11/13/2017 Tumor Marker   Patient's tumor was tested for the following markers: CA125 Results of the tumor marker test revealed 55.8   12/17/2017 Imaging   1. No findings to suggest metastatic disease in the abdomen or pelvis. 2. Severe hepatic cirrhosis with evidence of portal hypertension, as demonstrated by dilated portal vein, splenomegaly and portosystemic collateral pathways, including gastric and esophageal varices.  3. Colonic diverticulosis without evidence of acute diverticulitis at this time. 4. Additional incidental findings, as above.   01/16/2018 Tumor Marker   Patient's tumor was tested for the following markers: CA125 Results of the tumor marker test revealed 63.7   02/10/2018 Procedure   EGD - Recently bleeding grade III and large (> 5 mm) esophageal varices. Completely eradicated. Banded. - Portal hypertensive gastropathy. - Normal examined duodenum. - No specimens collected   02/13/2018 Tumor Marker   Patient's tumor was tested for the following markers: CA125 Results of the tumor marker test revealed 82.7   02/25/2018 Imaging   Bone density showed mild osteopenia   04/08/2018 Tumor Marker   Patient's tumor was tested for the following markers: CA125 Results of the tumor marker test revealed 82.7   04/08/2018 Imaging   1. No  definite omental or peritoneal surface lesions. However, there are 2 slowly enlarging lymph nodes noted. One is in the small bowel mesentery and the other is in the left deep pelvis. Could not exclude recurrent disease. 2. Stable advanced cirrhotic changes involving the liver with portal venous hypertension, portal venous collaterals, esophageal varices and splenomegaly. No worrisome hepatic lesions. 3. Diffuse wall thickening of the colon could suggest diffuse colitis or could be due to low albumin. Recommend correlation with any symptoms such as diarrhea.   05/21/2018 Tumor Marker   Patient's tumor was tested for the following markers: CA125 Results of the tumor marker test revealed 86   09/07/2018 Tumor Marker   Patient's tumor was  tested for the following markers:CA-125 Results of the tumor marker test revealed 111   09/29/2018 Tumor Marker   Patient's tumor was tested for the following markers: CA-125 Results of the tumor marker test revealed 121.   09/29/2018 - 11/24/2018 Chemotherapy   The patient had carboplatin and gemzar for chemotherapy treatment.  Chemo was stopped due to allergic reaction to carboplatin   10/27/2018 Tumor Marker   Patient's tumor was tested for the following markers: CA-125 Results of the tumor marker test revealed 99.9   11/24/2018 Tumor Marker   Patient's tumor was tested for the following markers: CA-125 Results of the tumor marker test revealed 81.2   12/14/2018 Tumor Marker   Patient's tumor was tested for the following markers: CA-125 Results of the tumor marker test revealed 81.2   12/14/2018 Imaging   1. Mild improvement in peritoneal disease. Peritoneal nodule within the left posterior pelvis contiguous with the left side of vaginal cuff is slightly decreased in size in the interval. Within the right iliac fossa there is a small peritoneal nodule which has decreased in size in the interval. Central small bowel mesenteric lymph node is not  significantly changed. Slight decrease in size left periaortic lymph node.    2. Morphologic features of the liver compatible with cirrhosis. Splenomegaly is noted which may reflect portal venous hypertension.   12/21/2018 Tumor Marker   Patient's tumor was tested for the following markers: CA-125 Results of the tumor marker test revealed 64.2   12/21/2018 -  Chemotherapy   The patient had taxotere for chemotherapy treatment.     03/02/2019 Tumor Marker   Patient's tumor was tested for the following markers: CA-125. 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   No new or progressive metastatic disease within the abdomen or pelvis.   Hepatic cirrhosis and findings of portal venous hypertension. 1.1 cm low-attenuation lesion within left hepatic lobe, which could represent a regenerative nodule or small hepatocellular carcinoma. Recommend abdomen MRI without and with contrast for further characterization.     04/13/2019 Tumor Marker   Patient's tumor was tested for the following markers: CA-125 Results of the tumor marker test revealed 36.8   05/26/2019 Tumor Marker   Patient's tumor was tested for the following markers: CA-125 Results of the tumor marker test revealed 31.6.   06/15/2019 Tumor Marker   Patient's tumor was tested for the following markers: CA-125 Results of the tumor marker test revealed 25.4   07/05/2019 Imaging   1. Stable CT examination. Stable peritoneal thickening in the pelvis and paracolic gutters. Stable solid left deep pelvic implant. Stable trace ascites. No new or progressive metastatic disease. 2. Stable cirrhosis. No discrete liver masses. 3. Stable mild-to-moderate splenomegaly. 4. Aortic Atherosclerosis (ICD10-I70.0).   07/27/2019 Tumor Marker   Patient's tumor was tested for the following markers: CA-125 Results of the tumor marker test revealed 35.4    08/17/2019 Tumor Marker   Patient's tumor was tested for the following markers: Ca-125 Results of the tumor marker test revealed 35.7.   09/17/2019 - 09/21/2019 Hospital Admission   She was admitted due to esophageal variceal bleeding presents with hematemesis and melanotic stool with epigastric pain.  GI consulted, started on octreotide infusion.  Underwent EGD which showed minimal bleeding, remain on octreotide drip, PPI drip.  Hemoglobin trended down therefore received PRBC transfusion.  Over several days her bleeding subsided, hemodynamically remained stable.  09/17/2019 Procedure   EGD report - Esophageal mucosal scarring, from prior esophageal banding sessions. - Grade I esophageal varices. - Red blood in the cardia and in the gastric fundus. - Portal hypertensive gastropathy. - Normal duodenal bulb, first portion of the duodenum and second portion of the duodenum. 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 2. Otherwise stable CT exam. Stable peritoneal thickening within the bilateral deep pelvic peritoneum. Stable left deep pelvic implant.No evidence for progressed disease. 3. Stable splenomegaly. 4. Morphologic changes to the liver compatible with cirrhosis. Trace perihepatic and pelvic ascites.   02/22/2020 Imaging   1. New lucent lesion in the left side of the L4 vertebral body measuring 1 cm, concerning for possible osseous metastasis. Consideration for further evaluation with bone scan is suggested. Close attention on follow-up imaging is also recommended to ensure stability. 2. Previously  noted peritoneal thickening and nodularity in the low anatomic pelvis has regressed slightly. 3. Borderline enlarged and mildly enlarged pelvic and retroperitoneal lymph nodes appear similar to slightly increased compared to the prior examination, as above. Continued attention on follow-up studies is recommended.  4. 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 5. Aortic atherosclerosis. 6. Additional incidental findings, as above.   02/22/2020 Tumor Marker   Patient's tumor was tested for the following markers: CA-125 Results of the tumor marker test revealed 30.1   04/06/2020 Tumor Marker   Patient's tumor was tested for the following markers: CA-125 Results of the tumor marker test revealed 35.7   09/11/2020 Tumor Marker   Patient's tumor was tested for the following markers: CA-125. Results of the tumor marker test revealed 26.6.   09/12/2020 Imaging   1. Slow enlargement of a currently 2.9 cubic cm soft tissue nodule along the left adnexa. Tumor deposit not excluded. 2. The left external iliac lymph node has substantially reduced in size, currently 0.9 cm in short axis and formerly 1.4 cm. 3. 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 4. Cirrhosis with portal venous hypertension. 5. Mild distal esophageal wall thickening, probably from esophagitis. Splenomegaly. 6. Scarring in the left kidney. 7. Sigmoid colon diverticulosis. 8. Pelvic floor laxity with small cystocele. 9. Lumbar impingement at L2-3, L3-4, and L4-5.     10/23/2020 Tumor Marker   Patient's tumor was tested for the following markers: CA-125. Results of the tumor marker test revealed 35.3.   12/12/2020 Tumor Marker   Patient's tumor was tested for the following markers: CA-125. Results of the tumor marker test revealed 39.1.   12/13/2020  Imaging   1. Continued increase in size of soft tissue nodule within the left posterior pelvis adjacent to the sigmoid colon. Cannot exclude small tumor deposit. 2. Similar appearance of thickening and mild nodularity along the peritoneal reflections of the pelvis and pericolic gutters. 3. Morphologic findings of the liver compatible with cirrhosis. 4. Splenomegaly. 5. Aortic Atherosclerosis (ICD10-I70.0).   01/24/2021 Tumor Marker   Patient's tumor was tested for the following markers: CA-125. Results of the tumor marker test revealed 56.2.   02/09/2021 Imaging   1. Mild  interval increased size of the solid left adnexal mass which may represent tumor recurrence/deposit. The mass abuts the adjacent sigmoid colon as well as the left posterior wall of the urinary bladder, invasion not excluded. 2. Otherwise no new mass or lymphadenopathy identified in the abdomen or pelvis. 3. Hepatic cirrhosis. Splenomegaly consistent with portal hypertension.   04/11/2021 Imaging   1. Left posterior pelvic soft tissue mass, adjacent to the sigmoid colon, has slightly increased in size in comparison to recent prior with significant interval increase in size over more remote priors, findings which are highly suspicious for active neoplastic disease involvement. 2. No new discrete omental or peritoneal nodules/masses identified. 3. Similar nodular thickening of the peritoneum along the bilateral pericolic gutters and in the pelvis without ascites. 4. No pathologically enlarged abdominal or pelvic lymph nodes and no solid organ metastases visualized. 5. Cirrhotic hepatic morphology with evidence of portal hypertension including splenomegaly. 6. Sigmoid colonic diverticulosis without findings of acute diverticulitis. 7. Moderate volume of formed stool throughout the colon. 8. Aortic Atherosclerosis (ICD10-I70.0).   04/11/2021 Tumor Marker   Patient's tumor was tested for the following markers: CA-125. Results of  the tumor marker test revealed 79.4 .   04/23/2021 -  Chemotherapy   Patient is on Treatment Plan : Ovarian Docetaxel q21d     04/24/2021 Tumor Marker   Patient's tumor was tested for the following markers: CA-125. Results of the tumor marker test revealed 68.5.   05/16/2021 Tumor Marker   Patient's tumor was tested for the following markers: CA-125. Results of the tumor marker test revealed 68.1.   06/28/2021 Tumor Marker   Patient's tumor was tested for the following markers: CA-125. Results of the tumor marker test revealed 52.   07/16/2021 Imaging   1. Stable cirrhotic changes involving the liver. Associated portal venous hypertension, portal venous collaterals and splenomegaly. No hepatic lesions. 2. Slight interval decrease in size of the left pelvic implant. 3. Stable minimal peritoneal nodularity involving the pericolic gutters bilaterally. No new or progressive findings.     08/09/2021 Tumor Marker   Patient's tumor was tested for the following markers: CA-125. Results of the tumor marker test revealed 44.1.     PHYSICAL EXAMINATION: ECOG PERFORMANCE STATUS: 1 - Symptomatic but completely ambulatory  Vitals:   08/28/21 0802  BP: 131/64  Pulse: 65  Resp: 18  Temp: (!) 97.4 F (36.3 C)  SpO2: 100%   Filed Weights   08/28/21 0802  Weight: 176 lb 9.6 oz (80.1 kg)    GENERAL:alert, no distress and comfortable NEURO: alert & oriented x 3 with fluent speech, no focal motor/sensory deficits  LABORATORY DATA:  I have reviewed the data as listed    Component Value Date/Time   NA 142 08/28/2021 0738   NA 142 02/06/2017 0742   K 3.7 08/28/2021 0738   K 3.9 02/06/2017 0742   CL 109 08/28/2021 0738   CO2 26 08/28/2021 0738   CO2 22 02/06/2017 0742   GLUCOSE 75 08/28/2021 0738   GLUCOSE 156 (H) 02/06/2017 0742   BUN 19 08/28/2021 0738   BUN 13.5 02/06/2017 0742   CREATININE 0.82 08/28/2021 0738   CREATININE 0.8 02/06/2017 0742   CALCIUM 9.2 08/28/2021 0738    CALCIUM 8.5 02/06/2017 0742   PROT 7.0 08/28/2021 0738   PROT 6.9 02/06/2017 0742   ALBUMIN 4.0 08/28/2021 0738   ALBUMIN 3.7 02/06/2017 0742   AST 18 08/28/2021 0738   AST 47 (H) 02/06/2017 0742   ALT 17  08/28/2021 0738   ALT 54 02/06/2017 0742   ALKPHOS 80 08/28/2021 0738   ALKPHOS 130 02/06/2017 0742   BILITOT 0.5 08/28/2021 0738   BILITOT 0.65 02/06/2017 0742   GFRNONAA >60 08/28/2021 0738   GFRAA >60 11/09/2019 0953   GFRAA >60 10/26/2019 0722    No results found for: "SPEP", "UPEP"  Lab Results  Component Value Date   WBC 4.8 08/28/2021   NEUTROABS 3.4 08/28/2021   HGB 11.3 (L) 08/28/2021   HCT 34.0 (L) 08/28/2021   MCV 96.0 08/28/2021   PLT 90 (L) 08/28/2021      Chemistry      Component Value Date/Time   NA 142 08/28/2021 0738   NA 142 02/06/2017 0742   K 3.7 08/28/2021 0738   K 3.9 02/06/2017 0742   CL 109 08/28/2021 0738   CO2 26 08/28/2021 0738   CO2 22 02/06/2017 0742   BUN 19 08/28/2021 0738   BUN 13.5 02/06/2017 0742   CREATININE 0.82 08/28/2021 0738   CREATININE 0.8 02/06/2017 0742      Component Value Date/Time   CALCIUM 9.2 08/28/2021 0738   CALCIUM 8.5 02/06/2017 0742   ALKPHOS 80 08/28/2021 0738   ALKPHOS 130 02/06/2017 0742   AST 18 08/28/2021 0738   AST 47 (H) 02/06/2017 0742   ALT 17 08/28/2021 0738   ALT 54 02/06/2017 0742   BILITOT 0.5 08/28/2021 0738   BILITOT 0.65 02/06/2017 0721

## 2021-08-28 NOTE — Patient Instructions (Signed)
[facility]  Discharge Instructions: Thank you for choosing [facility] to provide your oncology and hematology care.   If you have a lab appointment with the [facility], please go directly to the [facility] and check in at the registration area.   Wear comfortable clothing and clothing appropriate for easy access to any Portacath or PICC line.   We strive to give you quality time with your provider. You may need to reschedule your appointment if you arrive late (15 or more minutes).  Arriving late affects you and other patients whose appointments are after yours.  Also, if you miss three or more appointments without notifying the office, you may be dismissed from the clinic at the provider's discretion.      For prescription refill requests, have your pharmacy contact our office and allow 72 hours for refills to be completed.    Today you received the following chemotherapy and/or immunotherapy agent: Taxotere      To help prevent nausea and vomiting after your treatment, we encourage you to take your nausea medication as directed.  BELOW ARE SYMPTOMS THAT SHOULD BE REPORTED IMMEDIATELY: *FEVER GREATER THAN 100.4 F (38 C) OR HIGHER *CHILLS OR SWEATING *NAUSEA AND VOMITING THAT IS NOT CONTROLLED WITH YOUR NAUSEA MEDICATION *UNUSUAL SHORTNESS OF BREATH *UNUSUAL BRUISING OR BLEEDING *URINARY PROBLEMS (pain or burning when urinating, or frequent urination) *BOWEL PROBLEMS (unusual diarrhea, constipation, pain near the anus) TENDERNESS IN MOUTH AND THROAT WITH OR WITHOUT PRESENCE OF ULCERS (sore throat, sores in mouth, or a toothache) UNUSUAL RASH, SWELLING OR PAIN  UNUSUAL VAGINAL DISCHARGE OR ITCHING   Items with * indicate a potential emergency and should be followed up as soon as possible or go to the Emergency Department if any problems should occur.  Please show the CHEMOTHERAPY ALERT CARD or IMMUNOTHERAPY ALERT CARD at check-in to  the Emergency Department and triage nurse.  Should you have questions after your visit or need to cancel or reschedule your appointment, please contact [facility]  Dept: [phone]  and follow the prompts.  Office hours are 8:00 a.m. to 4:30 p.m. Monday - Friday. Please note that voicemails left after 4:00 p.m. may not be returned until the following business day.  We are closed weekends and major holidays. You have access to a nurse at all times for urgent questions. Please call the main number to the clinic Dept: [phone] and follow the prompts.   For any non-urgent questions, you may also contact your provider using MyChart. We now offer e-Visits for anyone 21 and older to request care online for non-urgent symptoms. For details visit [email or web address].   Also download the MyChart app! Go to the app store, search "MyChart", open the app, select [facility], and log in with your MyChart username and password.  Masks are optional in the cancer centers. If you would like for your care team to wear a mask while they are taking care of you, please let them know. For doctor visits, patients may have with them one support person who is at least 74 years old. At this time, visitors are not allowed in the infusion area.

## 2021-08-29 LAB — CA 125: Cancer Antigen (CA) 125: 42.3 U/mL — ABNORMAL HIGH (ref 0.0–38.1)

## 2021-09-03 ENCOUNTER — Other Ambulatory Visit: Payer: Self-pay

## 2021-09-11 ENCOUNTER — Other Ambulatory Visit: Payer: Self-pay

## 2021-09-17 MED FILL — Dexamethasone Sodium Phosphate Inj 100 MG/10ML: INTRAMUSCULAR | Qty: 1 | Status: AC

## 2021-09-18 ENCOUNTER — Inpatient Hospital Stay: Payer: Medicare Other | Attending: Hematology and Oncology

## 2021-09-18 ENCOUNTER — Inpatient Hospital Stay: Payer: Medicare Other

## 2021-09-18 VITALS — BP 138/60 | HR 73 | Temp 97.9°F | Resp 16

## 2021-09-18 DIAGNOSIS — C786 Secondary malignant neoplasm of retroperitoneum and peritoneum: Secondary | ICD-10-CM | POA: Diagnosis not present

## 2021-09-18 DIAGNOSIS — Z794 Long term (current) use of insulin: Secondary | ICD-10-CM | POA: Insufficient documentation

## 2021-09-18 DIAGNOSIS — C563 Malignant neoplasm of bilateral ovaries: Secondary | ICD-10-CM | POA: Diagnosis not present

## 2021-09-18 DIAGNOSIS — G629 Polyneuropathy, unspecified: Secondary | ICD-10-CM | POA: Diagnosis not present

## 2021-09-18 DIAGNOSIS — Z5111 Encounter for antineoplastic chemotherapy: Secondary | ICD-10-CM | POA: Insufficient documentation

## 2021-09-18 DIAGNOSIS — C482 Malignant neoplasm of peritoneum, unspecified: Secondary | ICD-10-CM

## 2021-09-18 DIAGNOSIS — J90 Pleural effusion, not elsewhere classified: Secondary | ICD-10-CM | POA: Diagnosis not present

## 2021-09-18 DIAGNOSIS — D61818 Other pancytopenia: Secondary | ICD-10-CM | POA: Insufficient documentation

## 2021-09-18 DIAGNOSIS — Z7189 Other specified counseling: Secondary | ICD-10-CM

## 2021-09-18 DIAGNOSIS — E1140 Type 2 diabetes mellitus with diabetic neuropathy, unspecified: Secondary | ICD-10-CM | POA: Diagnosis not present

## 2021-09-18 DIAGNOSIS — I429 Cardiomyopathy, unspecified: Secondary | ICD-10-CM | POA: Diagnosis not present

## 2021-09-18 DIAGNOSIS — G8929 Other chronic pain: Secondary | ICD-10-CM | POA: Insufficient documentation

## 2021-09-18 DIAGNOSIS — Z79899 Other long term (current) drug therapy: Secondary | ICD-10-CM | POA: Insufficient documentation

## 2021-09-18 LAB — CMP (CANCER CENTER ONLY)
ALT: 20 U/L (ref 0–44)
AST: 20 U/L (ref 15–41)
Albumin: 4.1 g/dL (ref 3.5–5.0)
Alkaline Phosphatase: 91 U/L (ref 38–126)
Anion gap: 7 (ref 5–15)
BUN: 20 mg/dL (ref 8–23)
CO2: 26 mmol/L (ref 22–32)
Calcium: 8.7 mg/dL — ABNORMAL LOW (ref 8.9–10.3)
Chloride: 108 mmol/L (ref 98–111)
Creatinine: 0.81 mg/dL (ref 0.44–1.00)
GFR, Estimated: >60 mL/min (ref >60)
Glucose, Bld: 126 mg/dL — ABNORMAL HIGH (ref 70–99)
Potassium: 3.6 mmol/L (ref 3.5–5.1)
Sodium: 141 mmol/L (ref 135–145)
Total Bilirubin: 0.5 mg/dL (ref 0.3–1.2)
Total Protein: 7 g/dL (ref 6.5–8.1)

## 2021-09-18 LAB — CBC WITH DIFFERENTIAL (CANCER CENTER ONLY)
Abs Immature Granulocytes: 0.01 10*3/uL (ref 0.00–0.07)
Basophils Absolute: 0 10*3/uL (ref 0.0–0.1)
Basophils Relative: 1 %
Eosinophils Absolute: 0 10*3/uL (ref 0.0–0.5)
Eosinophils Relative: 0 %
HCT: 33.5 % — ABNORMAL LOW (ref 36.0–46.0)
Hemoglobin: 11.3 g/dL — ABNORMAL LOW (ref 12.0–15.0)
Immature Granulocytes: 0 %
Lymphocytes Relative: 19 %
Lymphs Abs: 0.8 10*3/uL (ref 0.7–4.0)
MCH: 32.2 pg (ref 26.0–34.0)
MCHC: 33.7 g/dL (ref 30.0–36.0)
MCV: 95.4 fL (ref 80.0–100.0)
Monocytes Absolute: 0.3 10*3/uL (ref 0.1–1.0)
Monocytes Relative: 6 %
Neutro Abs: 3.2 10*3/uL (ref 1.7–7.7)
Neutrophils Relative %: 74 %
Platelet Count: 93 10*3/uL — ABNORMAL LOW (ref 150–400)
RBC: 3.51 MIL/uL — ABNORMAL LOW (ref 3.87–5.11)
RDW: 15.4 % (ref 11.5–15.5)
WBC Count: 4.3 10*3/uL (ref 4.0–10.5)
nRBC: 0 % (ref 0.0–0.2)

## 2021-09-18 MED ORDER — SODIUM CHLORIDE 0.9 % IV SOLN
37.5000 mg/m2 | Freq: Once | INTRAVENOUS | Status: AC
  Administered 2021-09-18: 70 mg via INTRAVENOUS
  Filled 2021-09-18: qty 7

## 2021-09-18 MED ORDER — PEGFILGRASTIM 6 MG/0.6ML ~~LOC~~ PSKT
6.0000 mg | PREFILLED_SYRINGE | Freq: Once | SUBCUTANEOUS | Status: AC
  Administered 2021-09-18: 6 mg via SUBCUTANEOUS
  Filled 2021-09-18: qty 0.6

## 2021-09-18 MED ORDER — HEPARIN SOD (PORK) LOCK FLUSH 100 UNIT/ML IV SOLN
500.0000 [IU] | Freq: Once | INTRAVENOUS | Status: AC | PRN
  Administered 2021-09-18: 500 [IU]

## 2021-09-18 MED ORDER — SODIUM CHLORIDE 0.9 % IV SOLN
10.0000 mg | Freq: Once | INTRAVENOUS | Status: AC
  Administered 2021-09-18: 10 mg via INTRAVENOUS
  Filled 2021-09-18: qty 10

## 2021-09-18 MED ORDER — SODIUM CHLORIDE 0.9 % IV SOLN: Freq: Once | INTRAVENOUS | Status: AC

## 2021-09-18 MED ORDER — SODIUM CHLORIDE 0.9% FLUSH
10.0000 mL | INTRAVENOUS | Status: DC | PRN
  Administered 2021-09-18: 10 mL

## 2021-09-18 NOTE — Patient Instructions (Signed)
[facility]  Discharge Instructions: Thank you for choosing [facility] to provide your oncology and hematology care.   If you have a lab appointment with the [facility], please go directly to the [facility] and check in at the registration area.   Wear comfortable clothing and clothing appropriate for easy access to any Portacath or PICC line.   We strive to give you quality time with your provider. You may need to reschedule your appointment if you arrive late (15 or more minutes).  Arriving late affects you and other patients whose appointments are after yours.  Also, if you miss three or more appointments without notifying the office, you may be dismissed from the clinic at the provider's discretion.      For prescription refill requests, have your pharmacy contact our office and allow 72 hours for refills to be completed.    Today you received the following chemotherapy and/or immunotherapy agent: Taxotere      To help prevent nausea and vomiting after your treatment, we encourage you to take your nausea medication as directed.  BELOW ARE SYMPTOMS THAT SHOULD BE REPORTED IMMEDIATELY: *FEVER GREATER THAN 100.4 F (38 C) OR HIGHER *CHILLS OR SWEATING *NAUSEA AND VOMITING THAT IS NOT CONTROLLED WITH YOUR NAUSEA MEDICATION *UNUSUAL SHORTNESS OF BREATH *UNUSUAL BRUISING OR BLEEDING *URINARY PROBLEMS (pain or burning when urinating, or frequent urination) *BOWEL PROBLEMS (unusual diarrhea, constipation, pain near the anus) TENDERNESS IN MOUTH AND THROAT WITH OR WITHOUT PRESENCE OF ULCERS (sore throat, sores in mouth, or a toothache) UNUSUAL RASH, SWELLING OR PAIN  UNUSUAL VAGINAL DISCHARGE OR ITCHING   Items with * indicate a potential emergency and should be followed up as soon as possible or go to the Emergency Department if any problems should occur.  Please show the CHEMOTHERAPY ALERT CARD or IMMUNOTHERAPY ALERT CARD at check-in to  the Emergency Department and triage nurse.  Should you have questions after your visit or need to cancel or reschedule your appointment, please contact [facility]  Dept: [phone]  and follow the prompts.  Office hours are 8:00 a.m. to 4:30 p.m. Monday - Friday. Please note that voicemails left after 4:00 p.m. may not be returned until the following business day.  We are closed weekends and major holidays. You have access to a nurse at all times for urgent questions. Please call the main number to the clinic Dept: [phone] and follow the prompts.   For any non-urgent questions, you may also contact your provider using MyChart. We now offer e-Visits for anyone 88 and older to request care online for non-urgent symptoms. For details visit [email or web address].   Also download the MyChart app! Go to the app store, search "MyChart", open the app, select , and log in with your MyChart username and password.  Masks are optional in the cancer centers. If you would like for your care team to wear a mask while they are taking care of you, please let them know. For doctor visits, patients may have with them one support person who is at least 74 years old. At this time, visitors are not allowed in the infusion area.

## 2021-09-19 ENCOUNTER — Encounter: Payer: Self-pay | Admitting: Hematology and Oncology

## 2021-09-19 ENCOUNTER — Telehealth: Payer: Self-pay

## 2021-09-19 NOTE — Telephone Encounter (Addendum)
Returned her call. Her neulasta onpro after infusing for 45 mins this afternoon made a loud noise and then turned to flashing red light. Per her and her husband the onpro is on the empty line. Denies drainage and wetness at the dressing site of the the onpro. She will remove the onpro and place in a zip lock bag. She took pictures and will send to the office. Reassured her that the Neulasta infused and the office will call her back if needed.

## 2021-09-20 ENCOUNTER — Encounter: Payer: Self-pay | Admitting: Hematology and Oncology

## 2021-09-20 LAB — CA 125: Cancer Antigen (CA) 125: 38.2 U/mL — ABNORMAL HIGH (ref 0.0–38.1)

## 2021-09-20 NOTE — Telephone Encounter (Signed)
Notified pharmacy at [facility] of the issue with the neulasta onpro.

## 2021-09-20 NOTE — Telephone Encounter (Signed)
Called her back. Told her the office received the pictures onpro. Pharmacy will call the manufacturer later today to report the issue. Instructed to bring the onpro with her to the next visit and it will be given to pharmacy. She verbalized understanding and appreciated the call.

## 2021-10-08 MED FILL — Dexamethasone Sodium Phosphate Inj 100 MG/10ML: INTRAMUSCULAR | Qty: 1 | Status: AC

## 2021-10-09 ENCOUNTER — Inpatient Hospital Stay: Payer: Medicare Other

## 2021-10-09 ENCOUNTER — Encounter: Payer: Self-pay | Admitting: Hematology and Oncology

## 2021-10-09 ENCOUNTER — Inpatient Hospital Stay (HOSPITAL_BASED_OUTPATIENT_CLINIC_OR_DEPARTMENT_OTHER): Payer: Medicare Other | Admitting: Hematology and Oncology

## 2021-10-09 VITALS — BP 109/71 | HR 67 | Temp 98.1°F | Resp 18 | Ht 61.5 in | Wt 176.0 lb

## 2021-10-09 DIAGNOSIS — Z7189 Other specified counseling: Secondary | ICD-10-CM | POA: Diagnosis not present

## 2021-10-09 DIAGNOSIS — G8929 Other chronic pain: Secondary | ICD-10-CM

## 2021-10-09 DIAGNOSIS — C482 Malignant neoplasm of peritoneum, unspecified: Secondary | ICD-10-CM

## 2021-10-09 DIAGNOSIS — Z5111 Encounter for antineoplastic chemotherapy: Secondary | ICD-10-CM | POA: Diagnosis not present

## 2021-10-09 DIAGNOSIS — D61818 Other pancytopenia: Secondary | ICD-10-CM | POA: Diagnosis not present

## 2021-10-09 DIAGNOSIS — Z794 Long term (current) use of insulin: Secondary | ICD-10-CM

## 2021-10-09 DIAGNOSIS — E1140 Type 2 diabetes mellitus with diabetic neuropathy, unspecified: Secondary | ICD-10-CM

## 2021-10-09 DIAGNOSIS — M549 Dorsalgia, unspecified: Secondary | ICD-10-CM

## 2021-10-09 LAB — CMP (CANCER CENTER ONLY)
ALT: 17 U/L (ref 0–44)
AST: 18 U/L (ref 15–41)
Albumin: 4.1 g/dL (ref 3.5–5.0)
Alkaline Phosphatase: 79 U/L (ref 38–126)
Anion gap: 3 — ABNORMAL LOW (ref 5–15)
BUN: 18 mg/dL (ref 8–23)
CO2: 28 mmol/L (ref 22–32)
Calcium: 9 mg/dL (ref 8.9–10.3)
Chloride: 111 mmol/L (ref 98–111)
Creatinine: 0.74 mg/dL (ref 0.44–1.00)
GFR, Estimated: >60 mL/min (ref >60)
Glucose, Bld: 87 mg/dL (ref 70–99)
Potassium: 3.8 mmol/L (ref 3.5–5.1)
Sodium: 142 mmol/L (ref 135–145)
Total Bilirubin: 0.6 mg/dL (ref 0.3–1.2)
Total Protein: 6.7 g/dL (ref 6.5–8.1)

## 2021-10-09 LAB — CBC WITH DIFFERENTIAL (CANCER CENTER ONLY)
Abs Immature Granulocytes: 0.02 10*3/uL (ref 0.00–0.07)
Basophils Absolute: 0 10*3/uL (ref 0.0–0.1)
Basophils Relative: 0 %
Eosinophils Absolute: 0 10*3/uL (ref 0.0–0.5)
Eosinophils Relative: 0 %
HCT: 33.5 % — ABNORMAL LOW (ref 36.0–46.0)
Hemoglobin: 11.3 g/dL — ABNORMAL LOW (ref 12.0–15.0)
Immature Granulocytes: 0 %
Lymphocytes Relative: 22 %
Lymphs Abs: 1 10*3/uL (ref 0.7–4.0)
MCH: 32 pg (ref 26.0–34.0)
MCHC: 33.7 g/dL (ref 30.0–36.0)
MCV: 94.9 fL (ref 80.0–100.0)
Monocytes Absolute: 0.3 10*3/uL (ref 0.1–1.0)
Monocytes Relative: 6 %
Neutro Abs: 3.2 10*3/uL (ref 1.7–7.7)
Neutrophils Relative %: 72 %
Platelet Count: 92 10*3/uL — ABNORMAL LOW (ref 150–400)
RBC: 3.53 MIL/uL — ABNORMAL LOW (ref 3.87–5.11)
RDW: 15.3 % (ref 11.5–15.5)
WBC Count: 4.6 10*3/uL (ref 4.0–10.5)
nRBC: 0 % (ref 0.0–0.2)

## 2021-10-09 MED ORDER — HYDROCODONE-ACETAMINOPHEN 5-325 MG PO TABS: 1.0000 | ORAL_TABLET | Freq: Four times a day (QID) | ORAL | 0 refills | Status: DC | PRN

## 2021-10-09 MED ORDER — SODIUM CHLORIDE 0.9 % IV SOLN: Freq: Once | INTRAVENOUS | Status: AC

## 2021-10-09 MED ORDER — PEGFILGRASTIM 6 MG/0.6ML ~~LOC~~ PSKT
6.0000 mg | PREFILLED_SYRINGE | Freq: Once | SUBCUTANEOUS | Status: AC
  Administered 2021-10-09: 6 mg via SUBCUTANEOUS
  Filled 2021-10-09: qty 0.6

## 2021-10-09 MED ORDER — SODIUM CHLORIDE 0.9 % IV SOLN
37.5000 mg/m2 | Freq: Once | INTRAVENOUS | Status: AC
  Administered 2021-10-09: 70 mg via INTRAVENOUS
  Filled 2021-10-09: qty 7

## 2021-10-09 MED ORDER — SODIUM CHLORIDE 0.9% FLUSH
10.0000 mL | INTRAVENOUS | Status: DC | PRN
  Administered 2021-10-09: 10 mL

## 2021-10-09 MED ORDER — SODIUM CHLORIDE 0.9% FLUSH
10.0000 mL | Freq: Once | INTRAVENOUS | Status: AC
  Administered 2021-10-09: 10 mL

## 2021-10-09 MED ORDER — SODIUM CHLORIDE 0.9 % IV SOLN
10.0000 mg | Freq: Once | INTRAVENOUS | Status: AC
  Administered 2021-10-09: 10 mg via INTRAVENOUS
  Filled 2021-10-09: qty 10

## 2021-10-09 MED ORDER — HEPARIN SOD (PORK) LOCK FLUSH 100 UNIT/ML IV SOLN
500.0000 [IU] | Freq: Once | INTRAVENOUS | Status: AC | PRN
  Administered 2021-10-09: 500 [IU]

## 2021-10-09 NOTE — Assessment & Plan Note (Signed)
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  Her mild anemia is likely due to chemotherapy

## 2021-10-09 NOTE — Patient Instructions (Signed)
[facility]  Discharge Instructions: Thank you for choosing [facility] to provide your oncology and hematology care.   If you have a lab appointment with the [facility], please go directly to the [facility] and check in at the registration area.   Wear comfortable clothing and clothing appropriate for easy access to any Portacath or PICC line.   We strive to give you quality time with your provider. You may need to reschedule your appointment if you arrive late (15 or more minutes).  Arriving late affects you and other patients whose appointments are after yours.  Also, if you miss three or more appointments without notifying the office, you may be dismissed from the clinic at the provider's discretion.      For prescription refill requests, have your pharmacy contact our office and allow 72 hours for refills to be completed.    Today you received the following chemotherapy and/or immunotherapy agent: Taxotere, Onpro   To help prevent nausea and vomiting after your treatment, we encourage you to take your nausea medication as directed.  BELOW ARE SYMPTOMS THAT SHOULD BE REPORTED IMMEDIATELY: *FEVER GREATER THAN 100.4 F (38 C) OR HIGHER *CHILLS OR SWEATING *NAUSEA AND VOMITING THAT IS NOT CONTROLLED WITH YOUR NAUSEA MEDICATION *UNUSUAL SHORTNESS OF BREATH *UNUSUAL BRUISING OR BLEEDING *URINARY PROBLEMS (pain or burning when urinating, or frequent urination) *BOWEL PROBLEMS (unusual diarrhea, constipation, pain near the anus) TENDERNESS IN MOUTH AND THROAT WITH OR WITHOUT PRESENCE OF ULCERS (sore throat, sores in mouth, or a toothache) UNUSUAL RASH, SWELLING OR PAIN  UNUSUAL VAGINAL DISCHARGE OR ITCHING   Items with * indicate a potential emergency and should be followed up as soon as possible or go to the Emergency Department if any problems should occur.  Please show the CHEMOTHERAPY ALERT CARD or IMMUNOTHERAPY ALERT CARD at check-in  to the Emergency Department and triage nurse.  Should you have questions after your visit or need to cancel or reschedule your appointment, please contact [facility]  Dept: [phone]  and follow the prompts.  Office hours are 8:00 a.m. to 4:30 p.m. Monday - Friday. Please note that voicemails left after 4:00 p.m. may not be returned until the following business day.  We are closed weekends and major holidays. You have access to a nurse at all times for urgent questions. Please call the main number to the clinic Dept: [phone] and follow the prompts.   For any non-urgent questions, you may also contact your provider using MyChart. We now offer e-Visits for anyone 103 and older to request care online for non-urgent symptoms. For details visit [email or web address].   Also download the MyChart app! Go to the app store, search "MyChart", open the app, select [facility], and log in with your MyChart username and password.  Masks are optional in the cancer centers. If you would like for your care team to wear a mask while they are taking care of you, please let them know. For doctor visits, patients may have with them one support person who is at least 74 years old. At this time, visitors are not allowed in the infusion area.

## 2021-10-09 NOTE — Assessment & Plan Note (Signed)
She denies recent severe hyperglycemia and worsening neuropathy with treatment We will continue treatment as scheduled

## 2021-10-09 NOTE — Assessment & Plan Note (Signed)
She has chronic lower back pain, stable on prescribed hydrocodone I refilled her prescription today

## 2021-10-09 NOTE — Assessment & Plan Note (Signed)
So far, she tolerated treatment well except for persistent pancytopenia and neuropathy She has been on treatment for almost 6 months I recommend repeat CT imaging next month for further follow-up If she has excellent response to treatment, she can consider stopping treatment versus continuing therapy

## 2021-10-09 NOTE — Progress Notes (Signed)
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  Her mild anemia is likely due to chemotherapy  Diabetes mellitus with diabetic neuropathy, with long-term current use of insulin ([place]) She denies recent severe hyperglycemia and worsening neuropathy with treatment We will continue treatment as scheduled  Chronic back pain greater than 3 months duration She has chronic lower back pain, stable on prescribed hydrocodone I refilled her prescription today  Orders Placed This Encounter  Procedures   CT ABDOMEN PELVIS W CONTRAST    Standing Status:   Future    Standing Expiration Date:   10/10/2022    Order Specific Question:   If indicated for the ordered procedure, I authorize the administration of contrast media per Radiology protocol    Answer:   Yes    Order Specific Question:   Preferred imaging location?    Answer:   [facility]    Order Specific Question:   Radiology Contrast Protocol - do NOT remove file path    Answer:   [email or web address]   CBC with Differential (Cancer Center Only)    Standing Status:   Future    Standing Expiration Date:   11/02/2022   CMP  ([place] only)    Standing Status:   Future    Standing Expiration Date:   11/02/2022   CBC with Differential ([place] Only)    Standing Status:   Future    Standing Expiration Date:   11/23/2022   CMP ([place] only)    Standing Status:   Future    Standing Expiration Date:   11/23/2022   CBC with Differential ([place] Only)    Standing Status:   Future    Standing Expiration Date:   12/14/2022   CMP ([place] only)    Standing Status:   Future    Standing Expiration Date:   12/14/2022   CBC with Differential ([place] Only)    Standing Status:   Future    Standing Expiration Date:   01/04/2023   CMP ([place] only)    Standing Status:   Future    Standing Expiration Date:   01/04/2023    All questions were answered. The patient knows to call the clinic with any problems, questions or concerns. The total time spent in the appointment was 30 minutes encounter with patients including review of chart and various tests results, discussions about plan of care and coordination of care plan   [name], MD 10/09/2021 1:11 PM  INTERVAL HISTORY: Please see below for problem oriented charting. she returns for treatment follow-up on single agent Taxotere She tolerated recent treatment well No recent infection Denies worsening peripheral neuropathy Her chronic pain is stable  REVIEW OF SYSTEMS:   Constitutional: Denies fevers, chills or abnormal  weight loss Eyes: Denies blurriness of vision Ears, nose, mouth, throat, and face: Denies mucositis or sore throat Respiratory: Denies cough, dyspnea or wheezes Cardiovascular: Denies palpitation, chest discomfort or lower extremity swelling Gastrointestinal:  Denies nausea, heartburn or change in bowel habits Skin: Denies abnormal skin rashes Lymphatics: Denies new lymphadenopathy or easy bruising Neurological:Denies numbness, tingling or new weaknesses Behavioral/Psych: Mood is stable, no new changes   All other systems were reviewed with the patient and are negative.  I have reviewed the past medical history, past surgical history, social history and family history with the patient and they are unchanged from previous note.  ALLERGIES:  is allergic to carboplatin, shellfish allergy, vancomycin, benadryl [diphenhydramine hcl], penicillins, and tape.  MEDICATIONS:  Current Outpatient Medications  Medication Sig Dispense Refill   calcium elemental as carbonate (BARIATRIC TUMS ULTRA) 400 MG chewable tablet Chew 1 tablet by mouth 2 (two) times daily.     Cholecalciferol (VITAMIN D) 50 MCG (2000 UT) tablet Take 2,000 Units by mouth at bedtime.     dexamethasone (DECADRON) 4 MG tablet Take 1 tablet the day before chemo and daily after chemo for 2 days. Please dispense 18 tabs for all 6 cycles of treatment 18 tablet 1   gabapentin (NEURONTIN) 300 MG capsule Take 3 capsules (900 mg total) by mouth 2 (two) times daily. 120 capsule 11   HYDROcodone-acetaminophen (NORCO/VICODIN) 5-325 MG tablet Take 1 tablet by mouth every 6 (six) hours as needed for moderate pain. 60 tablet 0   insulin degludec (TRESIBA FLEXTOUCH) 100 UNIT/ML FlexTouch Pen Inject 28 Units into the skin daily.     lidocaine-prilocaine (EMLA) cream Apply to Porta-cath  1-2 hours prior to access as directed. (Patient taking differently: Apply 1 application. topically daily as needed (port access).) 30 g 9   ondansetron (ZOFRAN) 8 MG tablet TAKE 1 TABLET(8 MG) BY MOUTH EVERY 8 HOURS AS NEEDED 60 tablet 1   OZEMPIC, 0.25 OR 0.5 MG/DOSE, 2 MG/1.5ML SOPN Inject 0.25 mg into the skin every Saturday.     pantoprazole (PROTONIX) 40 MG tablet Take 1 tablet (40 mg total) by mouth daily.     rotigotine (NEUPRO) 4 MG/24HR APPLY 1 PATCH TOPICALLY TO THE SKIN DAILY 90 patch 3   No current facility-administered medications for this visit.    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 Had allergic reaction to carboplatin. Avastin was stopped due to GI hemorrhage   Primary peritoneal carcinomatosis ([place])  09/06/2011 Imaging   CT findings consistent with cirrhotic changes involving the liver.  No worrisome liver mass.  There are associated portal venous collaterals and splenomegaly consistent with portal venous hypertension. 2.  No other significant upper abdominal findings   11/08/2013 Imaging   US showed ascites   11/08/2013 Initial Diagnosis   Patient had ~ 2 months of some urinary incontinence, saw Dr Ronita Hipps in 10-2013, with pelvic mass on exam    11/12/2013 Imaging   There are findings of progressive hepatic cirrhosis with a large amount of ascites and mild splenomegaly. There are venous collaterals present. 2. There is abnormal thickening of the peritoneal surface along the left mid and lower abdominal wall. There is abnormal soft tissue density in the pelvis as well which could reflect the clinically suspected ovarian malignancy but the findings are nondiagnostic. 3. There is a new small left pleural effusion. 4. There is no acute bowel abnormality.   11/18/2013 Imaging  Successful ultrasound guided diagnostic and therapeutic paracentesis yielding 4.1 liters of ascites.     11/18/2013 Pathology Results   PERITONEAL/ASCITIC FLUID (SPECIMEN 1 OF 1 COLLECTED 11/18/13): SEROUS CARCINOMA, PLEASE SEE COMMENT.   12/01/2013 Tumor Marker   Patient's tumor was tested for the following markers: CA125 Results of the tumor marker test revealed 1938   12/03/2013 Imaging   Successful ultrasound-guided therapeutic paracentesis yielding 3.6 liters of peritoneal fluid.   12/07/2013 - 05/30/2014 Chemotherapy   She received 3 cycles of carboplatin and Taxol, interrupted cycle 4 for surgery.  She subsequently completed 3 more cycles of chemotherapy after surgery   12/20/2013 Imaging   Successful ultrasound-guided diagnostic and therapeutic  paracentesis yielding 2.2 liters of peritoneal fluid   12/20/2013 Pathology Results   PERITONEAL/ASCITIC FLUID(SPECIMEN 1 OF 1 COLLECTED 12/20/13): MALIGNANT CELLS CONSISTENT WITH SEROUS CARCINOMA   01/13/2014 Tumor Marker   Patient's tumor was tested for the following markers: CA125 Results of the tumor marker test revealed 227   02/07/2014 Tumor Marker   Patient's tumor was tested for the following markers: CA125 Results of the tumor marker test revealed 130   02/15/2014 Genetic Testing   Genetics testing from 02-2014 normal ([employer] panel)   02/23/2014 Imaging   Interval decrease in ascites. 2. Morphologic changes in the liver consistent with cirrhosis. 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 Portal vein remains patent at this time. 3. Persistent but decreased abnormal soft tissue attenuation tracking in the omental fat. This may be secondary to interval improvement in metastatic disease. 4. Peritoneal thickening seen in the left abdomen and pelvis on the previous study has decreased and nearly resolved in the interval. This also suggests interval improvement in metastatic disease.     03/07/2014 Procedure   Ultrasound and fluoroscopically guided right internal jugular single lumen power port catheter insertion. Tip in the SVC/RA junction. Catheter ready for use.   03/17/2014 Tumor Marker   Patient's tumor was tested for the following markers: CA125 Results of the tumor marker test revealed 45   03/29/2014 Pathology Results   1. Omentum, resection for tumor - HIGH GRADE SEROUS CARCINOMA, SEE COMMENT. 2. Ovary and fallopian tube, right - HIGH GRADE SEROUS CARCINOMA, SEE COMMENT. 3. Ovary and fallopian tube, left - HIGH GRADE SEROUS CARCINOMA, SEE COMMENT. Diagnosis Note 1. Nests and clusters of malignant cells are invading the omental tissue (part #1) with associated fibrosis. The cells are pleomorphic with prominent nucleoli. There are scattered  psammoma bodies. The ovaries are atrophic and exhibit multiple foci of surface based invasive carcinoma and associated fibrosis. The fallopian tubes have a few foci of carcinoma, also superficially located. There are no precursor lesions noted in the ovaries or fallopian tubes (the entire tubes and ovaries were submitted for evaluation). While there is some retraction artifact, there are several foci suspicious for lymphovascular invasion. 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 Given the fibrosis, there does appear to be a small amount of treatment effect, however, there is abundant residual tumor.    03/29/2014 Surgery   Procedure(s) Performed: 1. Exploratory laparotomy with bilateral salpingo-oophorectomy, omentectomy radical tumor debulking for ovarian cancer .   Surgeon: Thereasa Solo, MD.   Assistant Surgeon: Lahoma Crocker, M.D. Assistant: (an MD assistant was necessary for tissue manipulation, retraction and positioning due to the complexity of the case and hospital policies).  Operative Findings: 10cm  omental cake from hepatic to splenic flexure, densely adherent to transverse colon. Milliary studding of tumor implants (<6m, too numerous in number to count) adherent to the mesentery of the small bowel and small bowel wall. Small volume (100cc) ascites. Small ovaries bilaterally, densely adherent to the pelvic cul de sac. Left ovary and tube densely adherent to sigmoid colon. Cirrhotic liver with hepatomegaly and splenomegaly.     This represented an optimal cytoreduction (R1) with visible disease residual on the bowel wall and mesentery (millial, <320mimplants).      04/18/2014 Tumor Marker   Patient's tumor was tested for the following markers: CA125 Results of the tumor marker test revealed 122   05/16/2014 Tumor Marker   Patient's tumor was tested for  the following markers: CA125 Results of the tumor marker test revealed 30   06/10/2014 Tumor Marker   Patient's tumor was tested for the following markers: CA125 Results of the tumor marker test revealed 19   07/04/2014 Imaging   Interval improvement in the appearance of peritoneal metastasis secondary to ovarian cancer. 2. 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     08/18/2014 Tumor Marker   Patient's tumor was tested for the following markers: CA125 Results of the tumor marker test revealed 16   10/13/2014 Tumor Marker   Patient's tumor was tested for the following markers: CA125 Results of the tumor marker test revealed 14   11/21/2014 Tumor Marker   Patient's tumor was tested for the following markers: CA125 Results of the tumor marker test revealed 16   12/28/2014 Tumor Marker   Patient's tumor was tested for the following markers: CA125 Results of the tumor marker test revealed 762   02/27/2015 Tumor Marker   Patient's tumor was tested for the following markers: CA125 Results of the tumor marker test revealed 20.2   03/22/2015 Imaging   Filler appearance to the prior exam, with very subtle fluid tracking along portions of the liver, and very minimal nodularity along the right paracolic gutter representing residua from the prior peritoneal cancer. The current abnormalities could simply be post therapy findings rather than necessarily representing residual malignancy. No new or enlarging lesions are identified. 2. Hepatic cirrhosis and splenomegaly. There is some gastric varices suggesting portal venous hypertension. 3. Left foraminal stenosis at L4-5 due to spurring. There is likely also some central narrowing of the thecal sac at this level. 4. Bibasilar scarring. 5. Chronic mild left mid kidney scarring   03/27/2015 Tumor Marker   Patient's tumor was tested for the following markers: CA125 Results of the tumor marker test revealed 25.3   04/24/2015 Imaging    Disc bulge L2-3 with mild spinal stenosis and right foraminal encroachment. 2. Disc bulge L3-4 with mild spinal stenosis. 3. Multifactorial spinal, left lateral recess and foraminal stenosis L4-5. There is grade 1 anterolisthesis without evident dynamic instability.   05/29/2015 Tumor Marker   Patient's tumor was tested for the following markers: CA125 Results of the tumor marker test revealed 29.9   08/21/2015 Tumor Marker   Patient's tumor was tested for the following markers: CA125 Results of the tumor marker test revealed 45   09/18/2015 Tumor Marker   Patient's tumor was tested for the following markers: CA125 Results of the tumor marker test revealed 47   09/27/2015 Imaging   New small bowel mesenteric nodule and enlarged left external iliac lymph node, highly worrisome for metastatic disease. 2. Cirrhosis and splenomegaly   10/10/2015 Tumor Marker   Patient's tumor was  tested for the following markers: CA125 Results of the tumor marker test revealed 53.4   10/10/2015 - 12/11/2015 Chemotherapy   She received 4 cycles of carboplatin only   11/20/2015 Tumor Marker   Patient's tumor was tested for the following markers: CA125 Results of the tumor marker test revealed 41.3   12/20/2015 Imaging   CT: Mild left external iliac lymphadenopathy is slightly decreased. Mildly enlarged lower mesenteric nodule is slightly decreased. 2. No new or progressive metastatic disease in the abdomen or pelvis. 3. Cirrhosis. Stable subcentimeter hypodense left liver lobe lesion. No new liver lesions. 4. Stable mild splenomegaly.  No ascites. 5. Mild sigmoid diverticulosis.   01/01/2016 -  Anti-estrogen oral therapy   She was placed on tamoxifen. Had letrozole initially but switched to tamoxifen due to poor tolerance   01/15/2016 Tumor Marker   Patient's tumor was tested for the following markers: CA125 Results of the tumor marker test revealed 31.4   02/19/2016 Tumor Marker   Patient's tumor was tested  for the following markers: CA125 Results of the tumor marker test revealed 36.5   05/13/2016 Imaging   No evidence of disease progression within the abdomen or pelvis. Previously noted small central mesenteric nodule and left external iliac node appear slightly smaller. 2. Stable changes of hepatic cirrhosis and portal hypertension with associated splenomegaly. No new or enlarging hepatic lesions are identified. 3. No acute findings.   05/13/2016 Tumor Marker   Patient's tumor was tested for the following markers: CA125 Results of the tumor marker test revealed 41.2   06/24/2016 Tumor Marker   Patient's tumor was tested for the following markers: CA125 Results of the tumor marker test revealed 47.3   08/20/2016 Imaging   1. The left external iliac lymph node is slightly increased in size compared to the prior exam, currently 1.1 cm and previously 0.9 cm in diameter. 2. Stable central mesenteric lymph node at 0.9 cm in diameter. 3. Stable trace free pelvic fluid and stable slight thickening along the right paracolic gutter, without well-defined peritoneal nodularity. 4. Other imaging findings of potential clinical significance: Subsegmental atelectasis or scarring in the lung bases. Hepatic cirrhosis. Left renal scarring. Mild splenomegaly. Sigmoid colon diverticulosis. Impingement at L4-5 due to spondylosis and degenerative disc disease broad Schmorl' s nodes at L3-L4. Pelvic floor laxity   08/23/2016 Tumor Marker   Patient's tumor was tested for the following markers: CA125 Results of the tumor marker test revealed 80.2   09/04/2016 Imaging   LV EF: 60% -   65%   09/12/2016 Tumor Marker   Patient's tumor was tested for the following markers: CA125 Results of the tumor marker test revealed 98.5   09/12/2016 - 01/29/2018 Chemotherapy   The patient received Doxil and Avastin. Avastin was discontinued in Dec 2019 due to GI hemorrhage   09/26/2016 Tumor Marker   Patient's tumor was tested for the  following markers: CA125 Results of the tumor marker test revealed 68.9   10/10/2016 Tumor Marker   Patient's tumor was tested for the following markers: CA125 Results of the tumor marker test revealed 65.6   11/07/2016 Tumor Marker   Patient's tumor was tested for the following markers: CA125 Results of the tumor marker test revealed 46.4   11/29/2016 Imaging   Stable mild peritoneal thickening, suspicious for peritoneal carcinomatosis. No new or progressive disease identified. No evidence of ascites.  No significant change in 10 mm left external iliac and 8 mm mesenteric lymph nodes.  Stable hepatic cirrhosis, and splenomegaly  consistent with portal venous hypertension.  Colonic diverticulosis, without radiographic evidence of diverticulitis.   12/02/2016 Imaging   Normal LV size with EF 55%. Basal inferior and basal inferoseptal hypokinesis. Normal RV size and systolic function. No significant valvular abnormalities.   12/05/2016 Tumor Marker   Patient's tumor was tested for the following markers: CA125 Results of the tumor marker test revealed 49.1   01/09/2017 Tumor Marker   Patient's tumor was tested for the following markers: CA125 Results of the tumor marker test revealed 47.2   02/06/2017 Tumor Marker   Patient's tumor was tested for the following markers: CA125 Results of the tumor marker test revealed 44.1   02/18/2017 Imaging   Stable mild peritoneal thickening. No new or progressive disease identified within the abdomen or pelvis.  Stable tiny sub-cm left external iliac and mesenteric lymph nodes.  Stable hepatic cirrhosis, and splenomegaly consistent with portal venous hypertension. No evidence of hepatic neoplasm.  Colonic diverticulosis, without radiographic evidence of diverticulitis.   03/06/2017 Tumor Marker   Patient's tumor was tested for the following markers: CA125 Results of the tumor marker test revealed 50.4   03/17/2017 Imaging   - Left ventricle:  The cavity size was normal. Wall thickness was normal. Systolic function was normal. The estimated ejection fraction was in the range of 55% to 60%. Wall motion was normal; there were no regional wall motion abnormalities. Features are consistent with a pseudonormal left ventricular filling pattern, with concomitant abnormal relaxation and increased filling pressure (grade 2 diastolic dysfunction). - Mitral valve: There was mild regurgitation. - Left atrium: The atrium was mildly dilated   04/03/2017 Tumor Marker   Patient's tumor was tested for the following markers: CA125 Results of the tumor marker test revealed 47.2   05/14/2017 Imaging   CT scan of abdomen and pelvis Stable mild peritoneal thickening. No new or progressive disease within the abdomen or pelvis.  Stable hepatic cirrhosis. Stable splenomegaly, consistent with portal venous hypertension. No evidence of hepatic neoplasm.   05/15/2017 Tumor Marker   Patient's tumor was tested for the following markers: CA125 Results of the tumor marker test revealed 56.1   06/12/2017 Tumor Marker   Patient's tumor was tested for the following markers: CA125 Results of the tumor marker test revealed 49.2   07/10/2017 Tumor Marker   Patient's tumor was tested for the following markers: CA125 Results of the tumor marker test revealed 46.3   08/07/2017 Tumor Marker   Patient's tumor was tested for the following markers: CA125 Results of the tumor marker test revealed 52.9   08/20/2017 Imaging   1. Mild omental/peritoneal haziness, unchanged. No evidence of new metastatic disease. 2. Cirrhosis with splenomegaly.   09/04/2017 Tumor Marker   Patient's tumor was tested for the following markers: CA125 Results of the tumor marker test revealed 48.5   11/13/2017 Tumor Marker   Patient's tumor was tested for the following markers: CA125 Results of the tumor marker test revealed 55.8   12/17/2017 Imaging   1. No findings to suggest metastatic  disease in the abdomen or pelvis. 2. Severe hepatic cirrhosis with evidence of portal hypertension, as demonstrated by dilated portal vein, splenomegaly and portosystemic collateral pathways, including gastric and esophageal varices.  3. Colonic diverticulosis without evidence of acute diverticulitis at this time. 4. Additional incidental findings, as above.   01/16/2018 Tumor Marker   Patient's tumor was tested for the following markers: CA125 Results of the tumor marker test revealed 63.7   02/10/2018 Procedure   EGD -  Recently bleeding grade III and large (> 5 mm) esophageal varices. Completely eradicated. Banded. - Portal hypertensive gastropathy. - Normal examined duodenum. - No specimens collected   02/13/2018 Tumor Marker   Patient's tumor was tested for the following markers: CA125 Results of the tumor marker test revealed 82.7   02/25/2018 Imaging   Bone density showed mild osteopenia   04/08/2018 Tumor Marker   Patient's tumor was tested for the following markers: CA125 Results of the tumor marker test revealed 82.7   04/08/2018 Imaging   1. No definite omental or peritoneal surface lesions. However, there are 2 slowly enlarging lymph nodes noted. One is in the small bowel mesentery and the other is in the left deep pelvis. Could not exclude recurrent disease. 2. Stable advanced cirrhotic changes involving the liver with portal venous hypertension, portal venous collaterals, esophageal varices and splenomegaly. No worrisome hepatic lesions. 3. Diffuse wall thickening of the colon could suggest diffuse colitis or could be due to low albumin. Recommend correlation with any symptoms such as diarrhea.   05/21/2018 Tumor Marker   Patient's tumor was tested for the following markers: CA125 Results of the tumor marker test revealed 86   09/07/2018 Tumor Marker   Patient's tumor was tested for the following markers:CA-125 Results of the tumor marker test revealed 111   09/29/2018  Tumor Marker   Patient's tumor was tested for the following markers: CA-125 Results of the tumor marker test revealed 121.   09/29/2018 - 11/24/2018 Chemotherapy   The patient had carboplatin and gemzar for chemotherapy treatment.  Chemo was stopped due to allergic reaction to carboplatin   10/27/2018 Tumor Marker   Patient's tumor was tested for the following markers: CA-125 Results of the tumor marker test revealed 99.9   11/24/2018 Tumor Marker   Patient's tumor was tested for the following markers: CA-125 Results of the tumor marker test revealed 81.2   12/14/2018 Tumor Marker   Patient's tumor was tested for the following markers: CA-125 Results of the tumor marker test revealed 81.2   12/14/2018 Imaging   1. Mild improvement in peritoneal disease. Peritoneal nodule within the left posterior pelvis contiguous with the left side of vaginal cuff is slightly decreased in size in the interval. Within the right iliac fossa there is a small peritoneal nodule which has decreased in size in the interval. Central small bowel mesenteric lymph node is not significantly changed. Slight decrease in size left periaortic lymph node.    2. Morphologic features of the liver compatible with cirrhosis. Splenomegaly is noted which may reflect portal venous hypertension.   12/21/2018 Tumor Marker   Patient's tumor was tested for the following markers: CA-125 Results of the tumor marker test revealed 64.2   12/21/2018 -  Chemotherapy   The patient had taxotere for chemotherapy treatment.     03/02/2019 Tumor Marker   Patient's tumor was tested for the following markers: CA-125. 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   No new or progressive metastatic disease within the abdomen or pelvis.   Hepatic cirrhosis and findings of portal venous hypertension. 1.1 cm low-attenuation lesion within left  hepatic lobe, which could represent a regenerative nodule or small hepatocellular carcinoma. Recommend abdomen MRI without and with contrast for further characterization.     04/13/2019 Tumor Marker   Patient's tumor was tested for the following markers: CA-125 Results of the tumor marker test revealed 36.8  05/26/2019 Tumor Marker   Patient's tumor was tested for the following markers: CA-125 Results of the tumor marker test revealed 31.6.   06/15/2019 Tumor Marker   Patient's tumor was tested for the following markers: CA-125 Results of the tumor marker test revealed 25.4   07/05/2019 Imaging   1. Stable CT examination. Stable peritoneal thickening in the pelvis and paracolic gutters. Stable solid left deep pelvic implant. Stable trace ascites. No new or progressive metastatic disease. 2. Stable cirrhosis. No discrete liver masses. 3. Stable mild-to-moderate splenomegaly. 4. Aortic Atherosclerosis (ICD10-I70.0).   07/27/2019 Tumor Marker   Patient's tumor was tested for the following markers: CA-125 Results of the tumor marker test revealed 35.4   08/17/2019 Tumor Marker   Patient's tumor was tested for the following markers: Ca-125 Results of the tumor marker test revealed 35.7.   09/17/2019 - 09/21/2019 Hospital Admission   She was admitted due to esophageal variceal bleeding presents with hematemesis and melanotic stool with epigastric pain.  GI consulted, started on octreotide infusion.  Underwent EGD which showed minimal bleeding, remain on octreotide drip, PPI drip.  Hemoglobin trended down therefore received PRBC transfusion.  Over several days her bleeding subsided, hemodynamically remained stable.    09/17/2019 Procedure   EGD report - Esophageal mucosal scarring, from prior esophageal banding sessions. - Grade I esophageal varices. - Red blood in the cardia and in the gastric fundus. - Portal hypertensive gastropathy. - Normal duodenal bulb, first portion of the duodenum and  second portion of the duodenum. 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 2. Otherwise stable CT exam. Stable peritoneal thickening within the bilateral deep pelvic peritoneum. Stable left deep pelvic implant.No evidence for progressed disease. 3. Stable splenomegaly. 4. Morphologic changes to the liver compatible with cirrhosis. Trace perihepatic and pelvic ascites.   02/22/2020 Imaging   1. New lucent lesion in the left side of the L4 vertebral body measuring 1 cm, concerning for possible osseous metastasis. Consideration for further evaluation with bone scan is suggested. Close attention on follow-up imaging is also recommended to ensure stability. 2. Previously noted peritoneal thickening and nodularity in the low anatomic pelvis has regressed slightly. 3. Borderline enlarged and mildly enlarged pelvic and retroperitoneal lymph nodes appear similar to slightly increased compared to the prior examination, as above. Continued attention on follow-up studies is recommended.  4. 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 5. Aortic atherosclerosis. 6. Additional incidental findings, as above.   02/22/2020 Tumor Marker   Patient's tumor was tested for the following markers: CA-125 Results of the tumor marker test revealed 30.1   04/06/2020 Tumor Marker   Patient's tumor was tested for the following markers: CA-125 Results of the tumor marker test revealed 35.7   09/11/2020 Tumor Marker   Patient's tumor was tested for the following  markers: CA-125. Results of the tumor marker test revealed 26.6.   09/12/2020 Imaging   1. Slow enlargement of a currently 2.9 cubic cm soft tissue nodule along the left adnexa. Tumor deposit not excluded. 2. The left external iliac lymph node has substantially reduced in size, currently 0.9 cm in short axis and  formerly 1.4 cm. 3. 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 4. Cirrhosis with portal venous hypertension. 5. Mild distal esophageal wall thickening, probably from esophagitis. Splenomegaly. 6. Scarring in the left kidney. 7. Sigmoid colon diverticulosis. 8. Pelvic floor laxity with small cystocele. 9. Lumbar impingement at L2-3, L3-4, and L4-5.     10/23/2020 Tumor Marker   Patient's tumor was tested for the following markers: CA-125. Results of the tumor marker test revealed 35.3.   12/12/2020 Tumor Marker   Patient's tumor was tested for the following markers: CA-125. Results of the tumor marker test revealed 39.1.   12/13/2020 Imaging   1. Continued increase in size of soft tissue nodule within the left posterior pelvis adjacent to the sigmoid colon. Cannot exclude small tumor deposit. 2. Similar appearance of thickening and mild nodularity along the peritoneal reflections of the pelvis and pericolic gutters. 3. Morphologic findings of the liver compatible with cirrhosis. 4. Splenomegaly. 5. Aortic Atherosclerosis (ICD10-I70.0).   01/24/2021 Tumor Marker   Patient's tumor was tested for the following markers: CA-125. Results of the tumor marker test revealed 56.2.   02/09/2021 Imaging   1. Mild interval increased size of the solid left adnexal mass which may represent tumor recurrence/deposit. The mass abuts the adjacent sigmoid colon as well as the left posterior wall of the urinary bladder, invasion not excluded. 2. Otherwise no new mass or  lymphadenopathy identified in the abdomen or pelvis. 3. Hepatic cirrhosis. Splenomegaly consistent with portal hypertension.   04/11/2021 Imaging   1. Left posterior pelvic soft tissue mass, adjacent to the sigmoid colon, has slightly increased in size in comparison to recent prior with significant interval increase in size over more remote priors, findings which are highly suspicious for active neoplastic disease involvement. 2. No new discrete omental or peritoneal nodules/masses identified. 3. Similar nodular thickening of the peritoneum along the bilateral pericolic gutters and in the pelvis without ascites. 4. No pathologically enlarged abdominal or pelvic lymph nodes and no solid organ metastases visualized. 5. Cirrhotic hepatic morphology with evidence of portal hypertension including splenomegaly. 6. Sigmoid colonic diverticulosis without findings of acute diverticulitis. 7. Moderate volume of formed stool throughout the colon. 8. Aortic Atherosclerosis (ICD10-I70.0).   04/11/2021 Tumor Marker   Patient's tumor was tested for the following markers: CA-125. Results of the tumor marker test revealed 79.4 .   04/23/2021 - 10/09/2021 Chemotherapy   Patient is on Treatment Plan : Ovarian Docetaxel q21d     04/24/2021 Tumor Marker   Patient's tumor was tested for the following markers: CA-125. Results of the tumor marker test revealed 68.5.   05/16/2021 Tumor Marker   Patient's tumor was tested for the following markers: CA-125. Results of the tumor marker test revealed 68.1.   06/28/2021 Tumor Marker   Patient's tumor was tested for the following markers: CA-125. Results of the tumor marker test revealed 52.   07/16/2021 Imaging   1. Stable cirrhotic changes involving the liver. Associated portal venous hypertension, portal venous collaterals and splenomegaly. No hepatic lesions. 2. Slight interval decrease in size of the left pelvic implant. 3. Stable minimal peritoneal nodularity  involving the pericolic gutters bilaterally. No new or progressive findings.     08/09/2021 Tumor Marker   Patient's tumor was tested for the following markers: CA-125. Results of the tumor marker test revealed 44.1.   09/20/2021 Tumor Marker   Patient's tumor was tested for the following markers: CA-125.  Results of the tumor marker test revealed 38.2.   11/01/2021 -  Chemotherapy   Patient is on Treatment Plan : BREAST Docetaxel (77) q21d       PHYSICAL EXAMINATION: ECOG PERFORMANCE STATUS: 1 - Symptomatic but completely ambulatory  Vitals:   10/09/21 0828  BP: 109/71  Pulse: 67  Resp: 18  Temp: 98.1 F (36.7 C)  SpO2: 96%   Filed Weights   10/09/21 0828  Weight: 176 lb (79.8 kg)    GENERAL:alert, no distress and comfortable NEURO: alert & oriented x 3 with fluent speech, no focal motor/sensory deficits  LABORATORY DATA:  I have reviewed the data as listed    Component Value Date/Time   NA 142 10/09/2021 0753   NA 142 02/06/2017 0742   K 3.8 10/09/2021 0753   K 3.9 02/06/2017 0742   CL 111 10/09/2021 0753   CO2 28 10/09/2021 0753   CO2 22 02/06/2017 0742   GLUCOSE 87 10/09/2021 0753   GLUCOSE 156 (H) 02/06/2017 0742   BUN 18 10/09/2021 0753   BUN 13.5 02/06/2017 0742   CREATININE 0.74 10/09/2021 0753   CREATININE 0.8 02/06/2017 0742   CALCIUM 9.0 10/09/2021 0753   CALCIUM 8.5 02/06/2017 0742   PROT 6.7 10/09/2021 0753   PROT 6.9 02/06/2017 0742   ALBUMIN 4.1 10/09/2021 0753   ALBUMIN 3.7 02/06/2017 0742   AST 18 10/09/2021 0753   AST 47 (H) 02/06/2017 0742   ALT 17 10/09/2021 0753   ALT 54 02/06/2017 0742   ALKPHOS 79 10/09/2021 0753   ALKPHOS 130 02/06/2017 0742   BILITOT 0.6 10/09/2021 0753   BILITOT 0.65 02/06/2017 0742   GFRNONAA >60 10/09/2021 0753   GFRAA >60 11/09/2019 0953   GFRAA >60 10/26/2019 0722    No results found for: "SPEP", "UPEP"  Lab Results  Component Value Date   WBC 4.6 10/09/2021   NEUTROABS 3.2 10/09/2021   HGB 11.3  (L) 10/09/2021   HCT 33.5 (L) 10/09/2021   MCV 94.9 10/09/2021   PLT 92 (L) 10/09/2021      Chemistry      Component Value Date/Time   NA 142 10/09/2021 0753   NA 142 02/06/2017 0742   K 3.8 10/09/2021 0753   K 3.9 02/06/2017 0742   CL 111 10/09/2021 0753   CO2 28 10/09/2021 0753   CO2 22 02/06/2017 0742   BUN 18 10/09/2021 0753   BUN 13.5 02/06/2017 0742   CREATININE 0.74 10/09/2021 0753   CREATININE 0.8 02/06/2017 0742      Component Value Date/Time   CALCIUM 9.0 10/09/2021 0753   CALCIUM 8.5 02/06/2017 0742   ALKPHOS 79 10/09/2021 0753   ALKPHOS 130 02/06/2017 0742   AST 18 10/09/2021 0753   AST 47 (H) 02/06/2017 0742   ALT 17 10/09/2021 0753   ALT 54 02/06/2017 0742   BILITOT 0.6 10/09/2021 0753   BILITOT 0.65 02/06/2017 0742

## 2021-10-18 ENCOUNTER — Other Ambulatory Visit: Payer: Self-pay

## 2021-10-26 ENCOUNTER — Other Ambulatory Visit: Payer: Self-pay | Admitting: Hematology and Oncology

## 2021-10-29 ENCOUNTER — Other Ambulatory Visit: Payer: Self-pay

## 2021-10-30 ENCOUNTER — Ambulatory Visit (HOSPITAL_COMMUNITY)
Admission: RE | Admit: 2021-10-30 | Discharge: 2021-10-30 | Disposition: A | Discharge status: Home / Self Care | Payer: Medicare Other | Source: Ambulatory Visit | Attending: Hematology and Oncology | Admitting: Hematology and Oncology

## 2021-10-30 ENCOUNTER — Inpatient Hospital Stay: Payer: Medicare Other | Attending: Hematology and Oncology

## 2021-10-30 DIAGNOSIS — C482 Malignant neoplasm of peritoneum, unspecified: Secondary | ICD-10-CM | POA: Diagnosis present

## 2021-10-30 DIAGNOSIS — Z7189 Other specified counseling: Secondary | ICD-10-CM

## 2021-10-30 DIAGNOSIS — Z5111 Encounter for antineoplastic chemotherapy: Secondary | ICD-10-CM

## 2021-10-30 DIAGNOSIS — Z9221 Personal history of antineoplastic chemotherapy: Secondary | ICD-10-CM | POA: Insufficient documentation

## 2021-10-30 LAB — CBC WITH DIFFERENTIAL (CANCER CENTER ONLY)
Abs Immature Granulocytes: 0.01 10*3/uL (ref 0.00–0.07)
Basophils Absolute: 0 10*3/uL (ref 0.0–0.1)
Basophils Relative: 0 %
Eosinophils Absolute: 0 10*3/uL (ref 0.0–0.5)
Eosinophils Relative: 0 %
HCT: 37 % (ref 36.0–46.0)
Hemoglobin: 11.9 g/dL — ABNORMAL LOW (ref 12.0–15.0)
Immature Granulocytes: 0 %
Lymphocytes Relative: 18 %
Lymphs Abs: 0.9 10*3/uL (ref 0.7–4.0)
MCH: 31.1 pg (ref 26.0–34.0)
MCHC: 32.2 g/dL (ref 30.0–36.0)
MCV: 96.6 fL (ref 80.0–100.0)
Monocytes Absolute: 0.3 10*3/uL (ref 0.1–1.0)
Monocytes Relative: 6 %
Neutro Abs: 3.6 10*3/uL (ref 1.7–7.7)
Neutrophils Relative %: 76 %
Platelet Count: 100 10*3/uL — ABNORMAL LOW (ref 150–400)
RBC: 3.83 MIL/uL — ABNORMAL LOW (ref 3.87–5.11)
RDW: 15.4 % (ref 11.5–15.5)
WBC Count: 4.8 10*3/uL (ref 4.0–10.5)
nRBC: 0 % (ref 0.0–0.2)

## 2021-10-30 LAB — CMP (CANCER CENTER ONLY)
ALT: 17 U/L (ref 0–44)
AST: 19 U/L (ref 15–41)
Albumin: 4 g/dL (ref 3.5–5.0)
Alkaline Phosphatase: 86 U/L (ref 38–126)
Anion gap: 4 — ABNORMAL LOW (ref 5–15)
BUN: 18 mg/dL (ref 8–23)
CO2: 28 mmol/L (ref 22–32)
Calcium: 8.7 mg/dL — ABNORMAL LOW (ref 8.9–10.3)
Chloride: 108 mmol/L (ref 98–111)
Creatinine: 0.87 mg/dL (ref 0.44–1.00)
GFR, Estimated: >60 mL/min (ref >60)
Glucose, Bld: 86 mg/dL (ref 70–99)
Potassium: 4.2 mmol/L (ref 3.5–5.1)
Sodium: 140 mmol/L (ref 135–145)
Total Bilirubin: 0.6 mg/dL (ref 0.3–1.2)
Total Protein: 6.9 g/dL (ref 6.5–8.1)

## 2021-10-30 MED ORDER — SODIUM CHLORIDE 0.9% FLUSH
10.0000 mL | Freq: Once | INTRAVENOUS | Status: AC
  Administered 2021-10-30: 10 mL

## 2021-10-30 MED ORDER — SODIUM CHLORIDE (PF) 0.9 % IJ SOLN
INTRAMUSCULAR | Status: AC
  Filled 2021-10-30: qty 50

## 2021-10-30 MED ORDER — IOHEXOL 300 MG/ML  SOLN
100.0000 mL | Freq: Once | INTRAMUSCULAR | Status: AC | PRN
  Administered 2021-10-30: 100 mL via INTRAVENOUS

## 2021-10-30 MED ORDER — HEPARIN SOD (PORK) LOCK FLUSH 100 UNIT/ML IV SOLN
500.0000 [IU] | Freq: Once | INTRAVENOUS | Status: AC
  Administered 2021-10-30: 500 [IU] via INTRAVENOUS

## 2021-10-30 MED ORDER — IOHEXOL 9 MG/ML PO SOLN: 1000.0000 mL | ORAL | Status: AC

## 2021-10-30 MED ORDER — HEPARIN SOD (PORK) LOCK FLUSH 100 UNIT/ML IV SOLN
INTRAVENOUS | Status: AC
  Filled 2021-10-30: qty 5

## 2021-10-31 MED FILL — Dexamethasone Sodium Phosphate Inj 100 MG/10ML: INTRAMUSCULAR | Qty: 1 | Status: AC

## 2021-11-01 ENCOUNTER — Encounter: Payer: Self-pay | Admitting: Hematology and Oncology

## 2021-11-01 ENCOUNTER — Telehealth: Payer: Self-pay

## 2021-11-01 ENCOUNTER — Inpatient Hospital Stay: Payer: Medicare Other

## 2021-11-01 ENCOUNTER — Other Ambulatory Visit: Payer: Self-pay

## 2021-11-01 ENCOUNTER — Inpatient Hospital Stay (HOSPITAL_BASED_OUTPATIENT_CLINIC_OR_DEPARTMENT_OTHER): Payer: Medicare Other | Admitting: Hematology and Oncology

## 2021-11-01 ENCOUNTER — Other Ambulatory Visit (HOSPITAL_COMMUNITY): Payer: Self-pay

## 2021-11-01 VITALS — BP 124/52 | HR 80 | Temp 97.8°F | Resp 18 | Ht 61.5 in | Wt 176.6 lb

## 2021-11-01 DIAGNOSIS — Z7189 Other specified counseling: Secondary | ICD-10-CM

## 2021-11-01 DIAGNOSIS — C482 Malignant neoplasm of peritoneum, unspecified: Secondary | ICD-10-CM | POA: Diagnosis present

## 2021-11-01 DIAGNOSIS — Z9221 Personal history of antineoplastic chemotherapy: Secondary | ICD-10-CM | POA: Diagnosis not present

## 2021-11-01 LAB — CA 125: Cancer Antigen (CA) 125: 37.7 U/mL (ref 0.0–38.1)

## 2021-11-01 MED ORDER — NIRAPARIB TOSYLATE 200 MG PO TABS
200.0000 mg | ORAL_TABLET | Freq: Every day | ORAL | 11 refills | Status: DC
  Filled 2021-11-01: qty 30, fill #0

## 2021-11-01 NOTE — Telephone Encounter (Signed)
Oral Oncology Patient Advocate Encounter  Prior Authorization for [employer] [employer] has been approved.    PA# [id number]  Effective dates: 09.21.23 through 09.21.24  Patients co-pay is $897.31.    Berdine Addison, [facility]  [phone] (phone) [phone] (fax)

## 2021-11-01 NOTE — Telephone Encounter (Signed)
Oral Oncology Patient Advocate Encounter   Received notification that prior authorization for [facility] is required.   PA submitted on 09.21.23  Key [id number]  Status is pending     Berdine Addison, [place] [facility]  [phone] (phone) [phone] (fax)

## 2021-11-01 NOTE — Assessment & Plan Note (Signed)
We had extensive discussions in the past about the role of palliative chemotherapy She is aware that she needs to be maintained on treatment for the rest of her life She is aware that treatment is not considered curative

## 2021-11-01 NOTE — Progress Notes (Signed)
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[facility]) I have reviewed blood work and imaging studies with the patient and her husband She has stability on imaging study Her tumor marker is now normal We discussed the risk, benefits, side effects of continuing chemotherapy versus taking a treatment break versus trial of a PARP inhibitor with niraparib The risk, benefits, side effects of each option were fully discussed and ultimately, she would like to try niraparib I will get insurance prior authorization with plan to start her treatment on October 1 Given her history of pancytopenia, I will start with reduced dose niraparib I plan to see her within 7 to 10 days after the start date of treatment for toxicity review  Goals of care, counseling/discussion We had extensive discussions in the past about the role of palliative chemotherapy She is aware that she needs to be maintained on treatment for the rest of her life She is aware that treatment is not considered curative  Orders Placed This Encounter  Procedures   CBC with Differential/Platelet    Standing Status:   Standing    Number of Occurrences:   22    Standing Expiration Date:   11/02/2022   Comprehensive metabolic panel    Standing Status:   Standing    Number of Occurrences:   33    Standing Expiration Date:   11/02/2022    All questions were answered. The patient knows to call the clinic with any problems, questions or concerns. The total time spent in the appointment was 40 minutes encounter with patients including review of chart and various tests results, discussions about plan of care and coordination of care plan   [name], MD 11/01/2021 10:58 AM  INTERVAL HISTORY: Please see below for problem oriented charting. she returns for  treatment follow-up with her husband She is experiencing slight worsening neuropathy recently Denies abdominal pain or recent changes in bowel habits  REVIEW OF SYSTEMS:   Constitutional: Denies fevers, chills or abnormal weight loss Eyes: Denies blurriness of vision Ears, nose, mouth, throat, and face: Denies mucositis or sore throat Respiratory: Denies cough, dyspnea or wheezes Cardiovascular: Denies palpitation, chest discomfort or lower extremity swelling Gastrointestinal:  Denies nausea, heartburn or change in bowel habits Skin: Denies abnormal skin rashes Lymphatics: Denies new lymphadenopathy or easy bruising Behavioral/Psych: Mood is stable, no new changes  All other systems were reviewed with the patient and are negative.  I have reviewed the past medical history, past surgical history, social history and family history with the patient and they are unchanged from previous note.  ALLERGIES:  is allergic to carboplatin, shellfish allergy, vancomycin, benadryl [diphenhydramine hcl], penicillins, and tape.  MEDICATIONS:  Current Outpatient Medications  Medication Sig Dispense Refill   niraparib tosylate 200 MG TABS Take 200 mg by mouth daily. 30 tablet 11   calcium elemental as carbonate (BARIATRIC TUMS ULTRA) 400 MG chewable tablet Chew 1 tablet by mouth 2 (two) times daily.     Cholecalciferol (VITAMIN D) 50 MCG (2000 UT) tablet Take 2,000 Units by mouth at bedtime.     dexamethasone (DECADRON) 4 MG tablet Take 1 tablet the day before chemo and daily after chemo for 2 days. Please dispense 18 tabs for all 6 cycles of treatment 18 tablet 1   gabapentin (NEURONTIN) 300 MG capsule Take 3  capsules (900 mg total) by mouth 2 (two) times daily. 120 capsule 11   HYDROcodone-acetaminophen (NORCO/VICODIN) 5-325 MG tablet Take 1 tablet by mouth every 6 (six) hours as needed for moderate pain. 60 tablet 0   insulin degludec (TRESIBA FLEXTOUCH) 100 UNIT/ML FlexTouch Pen Inject 28 Units into  the skin daily.     lidocaine-prilocaine (EMLA) cream Apply to Porta-cath  1-2 hours prior to access as directed. (Patient taking differently: Apply 1 application. topically daily as needed (port access).) 30 g 9   ondansetron (ZOFRAN) 8 MG tablet TAKE 1 TABLET(8 MG) BY MOUTH EVERY 8 HOURS AS NEEDED 60 tablet 1   OZEMPIC, 0.25 OR 0.5 MG/DOSE, 2 MG/1.5ML SOPN Inject 0.25 mg into the skin every Saturday.     pantoprazole (PROTONIX) 40 MG tablet Take 1 tablet (40 mg total) by mouth daily.     rotigotine (NEUPRO) 4 MG/24HR APPLY 1 PATCH TOPICALLY TO THE SKIN DAILY 90 patch 3   No current facility-administered medications for this visit.    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 Had allergic reaction to carboplatin. Avastin was stopped due to GI hemorrhage   Primary peritoneal carcinomatosis ([place])  09/06/2011 Imaging   CT findings consistent with cirrhotic changes involving the liver.  No worrisome liver mass.  There are associated portal venous collaterals and splenomegaly consistent with portal venous hypertension. 2.  No other significant upper abdominal findings   11/08/2013 Imaging   US showed ascites   11/08/2013 Initial Diagnosis   Patient had ~ 2 months of some urinary incontinence, saw Dr Ronita Hipps in 10-2013, with pelvic mass on exam    11/12/2013 Imaging   There are findings of progressive hepatic cirrhosis with a large amount of ascites and mild splenomegaly. There are venous collaterals present. 2. There is abnormal thickening of the peritoneal surface along the left mid and lower abdominal wall. There is abnormal soft tissue density in the pelvis as well which could reflect the clinically suspected ovarian malignancy but the findings are nondiagnostic. 3. There is a new small left pleural effusion. 4. There is no acute bowel abnormality.   11/18/2013 Imaging   Successful ultrasound  guided diagnostic and therapeutic paracentesis yielding 4.1 liters of ascites.     11/18/2013 Pathology Results   PERITONEAL/ASCITIC FLUID (SPECIMEN 1 OF 1 COLLECTED 11/18/13): SEROUS CARCINOMA, PLEASE SEE COMMENT.   12/01/2013 Tumor Marker   Patient's tumor was tested for the following markers: CA125 Results of the tumor marker test revealed 1938   12/03/2013 Imaging   Successful ultrasound-guided therapeutic paracentesis yielding 3.6 liters of peritoneal fluid.   12/07/2013 - 05/30/2014 Chemotherapy   She received 3 cycles of carboplatin and Taxol, interrupted cycle 4 for surgery.  She subsequently completed 3 more cycles of chemotherapy after surgery   12/20/2013 Imaging   Successful ultrasound-guided diagnostic and therapeutic paracentesis yielding 2.2 liters of peritoneal fluid   12/20/2013 Pathology Results   PERITONEAL/ASCITIC FLUID(SPECIMEN 1 OF 1 COLLECTED 12/20/13): MALIGNANT CELLS CONSISTENT WITH SEROUS CARCINOMA   01/13/2014 Tumor Marker   Patient's tumor was tested for the following markers: CA125 Results of the tumor marker test revealed 227   02/07/2014 Tumor Marker   Patient's tumor was tested for the following markers: CA125 Results of the tumor marker test revealed 130   02/15/2014 Genetic Testing   Genetics testing from 02-2014 normal ([employer] panel)   02/23/2014 Imaging   Interval decrease in ascites. 2. Morphologic  changes in the liver consistent with cirrhosis. 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 Portal vein remains patent at this time. 3. Persistent but decreased abnormal soft tissue attenuation tracking in the omental fat. This may be secondary to interval improvement in metastatic disease. 4. Peritoneal thickening seen in the left abdomen and pelvis on the previous study has decreased and nearly resolved in the interval. This also suggests interval improvement in metastatic disease.     03/07/2014 Procedure   Ultrasound  and fluoroscopically guided right internal jugular single lumen power port catheter insertion. Tip in the SVC/RA junction. Catheter ready for use.   03/17/2014 Tumor Marker   Patient's tumor was tested for the following markers: CA125 Results of the tumor marker test revealed 45   03/29/2014 Pathology Results   1. Omentum, resection for tumor - HIGH GRADE SEROUS CARCINOMA, SEE COMMENT. 2. Ovary and fallopian tube, right - HIGH GRADE SEROUS CARCINOMA, SEE COMMENT. 3. Ovary and fallopian tube, left - HIGH GRADE SEROUS CARCINOMA, SEE COMMENT. Diagnosis Note 1. Nests and clusters of malignant cells are invading the omental tissue (part #1) with associated fibrosis. The cells are pleomorphic with prominent nucleoli. There are scattered psammoma bodies. The ovaries are atrophic and exhibit multiple foci of surface based invasive carcinoma and associated fibrosis. The fallopian tubes have a few foci of carcinoma, also superficially located. There are no precursor lesions noted in the ovaries or fallopian tubes (the entire tubes and ovaries were submitted for evaluation). While there is some retraction artifact, there are several foci suspicious for lymphovascular invasion. 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 Given the fibrosis, there does appear to be a small amount of treatment effect, however, there is abundant residual tumor.    03/29/2014 Surgery   Procedure(s) Performed: 1. Exploratory laparotomy with bilateral salpingo-oophorectomy, omentectomy radical tumor debulking for ovarian cancer .   Surgeon: Thereasa Solo, MD.   Assistant Surgeon: Lahoma Crocker, M.D. Assistant: (an MD assistant was necessary for tissue manipulation, retraction and positioning due to the complexity of the case and hospital policies).  Operative Findings: 10cm omental cake from  hepatic to splenic flexure, densely adherent to transverse colon. Milliary studding of tumor implants (<26m, too numerous in number to count) adherent to the mesentery of the small bowel and small bowel wall. Small volume (100cc) ascites. Small ovaries bilaterally, densely adherent to the pelvic cul de sac. Left ovary and tube densely adherent to sigmoid colon. Cirrhotic liver with hepatomegaly and splenomegaly.     This represented an optimal cytoreduction (R1) with visible disease residual on the bowel wall and mesentery (millial, <331mimplants).      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 2. 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     08/18/2014 Tumor Marker   Patient's tumor was tested for the following markers: CA125 Results of the tumor marker test revealed 16  10/13/2014 Tumor Marker   Patient's tumor was tested for the following markers: CA125 Results of the tumor marker test revealed 14   11/21/2014 Tumor Marker   Patient's tumor was tested for the following markers: CA125 Results of the tumor marker test revealed 16   12/28/2014 Tumor Marker   Patient's tumor was tested for the following markers: CA125 Results of the tumor marker test revealed 762   02/27/2015 Tumor Marker   Patient's tumor was tested for the following markers: CA125 Results of the tumor marker test revealed 20.2   03/22/2015 Imaging   Filler appearance to the prior exam, with very subtle fluid tracking along portions of  the liver, and very minimal nodularity along the right paracolic gutter representing residua from the prior peritoneal cancer. The current abnormalities could simply be post therapy findings rather than necessarily representing residual malignancy. No new or enlarging lesions are identified. 2. Hepatic cirrhosis and splenomegaly. There is some gastric varices suggesting portal venous hypertension. 3. Left foraminal stenosis at L4-5 due to spurring. There is likely also some central narrowing of the thecal sac at this level. 4. Bibasilar scarring. 5. Chronic mild left mid kidney scarring   03/27/2015 Tumor Marker   Patient's tumor was tested for the following markers: CA125 Results of the tumor marker test revealed 25.3   04/24/2015 Imaging   Disc bulge L2-3 with mild spinal stenosis and right foraminal encroachment. 2. Disc bulge L3-4 with mild spinal stenosis. 3. Multifactorial spinal, left lateral recess and foraminal stenosis L4-5. There is grade 1 anterolisthesis without evident dynamic instability.   05/29/2015 Tumor Marker   Patient's tumor was tested for the following markers: CA125 Results of the tumor marker test revealed 29.9   08/21/2015 Tumor Marker   Patient's tumor was tested for the following markers: CA125 Results of the tumor marker test revealed 45   09/18/2015 Tumor Marker   Patient's tumor was tested for the following markers: CA125 Results of the tumor marker test revealed 47   09/27/2015 Imaging   New small bowel mesenteric nodule and enlarged left external iliac lymph node, highly worrisome for metastatic disease. 2. Cirrhosis and splenomegaly   10/10/2015 Tumor Marker   Patient's tumor was tested for the following markers: CA125 Results of the tumor marker test revealed 53.4   10/10/2015 - 12/11/2015 Chemotherapy   She received 4 cycles of carboplatin only   11/20/2015 Tumor Marker   Patient's tumor was tested for the following markers: CA125 Results of the tumor  marker test revealed 41.3   12/20/2015 Imaging   CT: Mild left external iliac lymphadenopathy is slightly decreased. Mildly enlarged lower mesenteric nodule is slightly decreased. 2. No new or progressive metastatic disease in the abdomen or pelvis. 3. Cirrhosis. Stable subcentimeter hypodense left liver lobe lesion. No new liver lesions. 4. Stable mild splenomegaly.  No ascites. 5. Mild sigmoid diverticulosis.   01/01/2016 -  Anti-estrogen oral therapy   She was placed on tamoxifen. Had letrozole initially but switched to tamoxifen due to poor tolerance   01/15/2016 Tumor Marker   Patient's tumor was tested for the following markers: CA125 Results of the tumor marker test revealed 31.4   02/19/2016 Tumor Marker   Patient's tumor was tested for the following markers: CA125 Results of the tumor marker test revealed 36.5   05/13/2016 Imaging   No evidence of disease progression within the abdomen or pelvis. Previously noted small central mesenteric nodule and left external iliac node appear slightly smaller. 2. Stable changes of  hepatic cirrhosis and portal hypertension with associated splenomegaly. No new or enlarging hepatic lesions are identified. 3. No acute findings.   05/13/2016 Tumor Marker   Patient's tumor was tested for the following markers: CA125 Results of the tumor marker test revealed 41.2   06/24/2016 Tumor Marker   Patient's tumor was tested for the following markers: CA125 Results of the tumor marker test revealed 47.3   08/20/2016 Imaging   1. The left external iliac lymph node is slightly increased in size compared to the prior exam, currently 1.1 cm and previously 0.9 cm in diameter. 2. Stable central mesenteric lymph node at 0.9 cm in diameter. 3. Stable trace free pelvic fluid and stable slight thickening along the right paracolic gutter, without well-defined peritoneal nodularity. 4. Other imaging findings of potential clinical significance: Subsegmental atelectasis or  scarring in the lung bases. Hepatic cirrhosis. Left renal scarring. Mild splenomegaly. Sigmoid colon diverticulosis. Impingement at L4-5 due to spondylosis and degenerative disc disease broad Schmorl' s nodes at L3-L4. Pelvic floor laxity   08/23/2016 Tumor Marker   Patient's tumor was tested for the following markers: CA125 Results of the tumor marker test revealed 80.2   09/04/2016 Imaging   LV EF: 60% -   65%   09/12/2016 Tumor Marker   Patient's tumor was tested for the following markers: CA125 Results of the tumor marker test revealed 98.5   09/12/2016 - 01/29/2018 Chemotherapy   The patient received Doxil and Avastin. Avastin was discontinued in Dec 2019 due to GI hemorrhage   09/26/2016 Tumor Marker   Patient's tumor was tested for the following markers: CA125 Results of the tumor marker test revealed 68.9   10/10/2016 Tumor Marker   Patient's tumor was tested for the following markers: CA125 Results of the tumor marker test revealed 65.6   11/07/2016 Tumor Marker   Patient's tumor was tested for the following markers: CA125 Results of the tumor marker test revealed 46.4   11/29/2016 Imaging   Stable mild peritoneal thickening, suspicious for peritoneal carcinomatosis. No new or progressive disease identified. No evidence of ascites.  No significant change in 10 mm left external iliac and 8 mm mesenteric lymph nodes.  Stable hepatic cirrhosis, and splenomegaly consistent with portal venous hypertension.  Colonic diverticulosis, without radiographic evidence of diverticulitis.   12/02/2016 Imaging   Normal LV size with EF 55%. Basal inferior and basal inferoseptal hypokinesis. Normal RV size and systolic function. No significant valvular abnormalities.   12/05/2016 Tumor Marker   Patient's tumor was tested for the following markers: CA125 Results of the tumor marker test revealed 49.1   01/09/2017 Tumor Marker   Patient's tumor was tested for the following markers:  CA125 Results of the tumor marker test revealed 47.2   02/06/2017 Tumor Marker   Patient's tumor was tested for the following markers: CA125 Results of the tumor marker test revealed 44.1   02/18/2017 Imaging   Stable mild peritoneal thickening. No new or progressive disease identified within the abdomen or pelvis.  Stable tiny sub-cm left external iliac and mesenteric lymph nodes.  Stable hepatic cirrhosis, and splenomegaly consistent with portal venous hypertension. No evidence of hepatic neoplasm.  Colonic diverticulosis, without radiographic evidence of diverticulitis.   03/06/2017 Tumor Marker   Patient's tumor was tested for the following markers: CA125 Results of the tumor marker test revealed 50.4   03/17/2017 Imaging   - Left ventricle: The cavity size was normal. Wall thickness was normal. Systolic function was normal. The estimated ejection fraction was  in the range of 55% to 60%. Wall motion was normal; there were no regional wall motion abnormalities. Features are consistent with a pseudonormal left ventricular filling pattern, with concomitant abnormal relaxation and increased filling pressure (grade 2 diastolic dysfunction). - Mitral valve: There was mild regurgitation. - Left atrium: The atrium was mildly dilated   04/03/2017 Tumor Marker   Patient's tumor was tested for the following markers: CA125 Results of the tumor marker test revealed 47.2   05/14/2017 Imaging   CT scan of abdomen and pelvis Stable mild peritoneal thickening. No new or progressive disease within the abdomen or pelvis.  Stable hepatic cirrhosis. Stable splenomegaly, consistent with portal venous hypertension. No evidence of hepatic neoplasm.   05/15/2017 Tumor Marker   Patient's tumor was tested for the following markers: CA125 Results of the tumor marker test revealed 56.1   06/12/2017 Tumor Marker   Patient's tumor was tested for the following markers: CA125 Results of the tumor marker test  revealed 49.2   07/10/2017 Tumor Marker   Patient's tumor was tested for the following markers: CA125 Results of the tumor marker test revealed 46.3   08/07/2017 Tumor Marker   Patient's tumor was tested for the following markers: CA125 Results of the tumor marker test revealed 52.9   08/20/2017 Imaging   1. Mild omental/peritoneal haziness, unchanged. No evidence of new metastatic disease. 2. Cirrhosis with splenomegaly.   09/04/2017 Tumor Marker   Patient's tumor was tested for the following markers: CA125 Results of the tumor marker test revealed 48.5   11/13/2017 Tumor Marker   Patient's tumor was tested for the following markers: CA125 Results of the tumor marker test revealed 55.8   12/17/2017 Imaging   1. No findings to suggest metastatic disease in the abdomen or pelvis. 2. Severe hepatic cirrhosis with evidence of portal hypertension, as demonstrated by dilated portal vein, splenomegaly and portosystemic collateral pathways, including gastric and esophageal varices.  3. Colonic diverticulosis without evidence of acute diverticulitis at this time. 4. Additional incidental findings, as above.   01/16/2018 Tumor Marker   Patient's tumor was tested for the following markers: CA125 Results of the tumor marker test revealed 63.7   02/10/2018 Procedure   EGD - Recently bleeding grade III and large (> 5 mm) esophageal varices. Completely eradicated. Banded. - Portal hypertensive gastropathy. - Normal examined duodenum. - No specimens collected   02/13/2018 Tumor Marker   Patient's tumor was tested for the following markers: CA125 Results of the tumor marker test revealed 82.7   02/25/2018 Imaging   Bone density showed mild osteopenia   04/08/2018 Tumor Marker   Patient's tumor was tested for the following markers: CA125 Results of the tumor marker test revealed 82.7   04/08/2018 Imaging   1. No definite omental or peritoneal surface lesions. However, there are 2 slowly  enlarging lymph nodes noted. One is in the small bowel mesentery and the other is in the left deep pelvis. Could not exclude recurrent disease. 2. Stable advanced cirrhotic changes involving the liver with portal venous hypertension, portal venous collaterals, esophageal varices and splenomegaly. No worrisome hepatic lesions. 3. Diffuse wall thickening of the colon could suggest diffuse colitis or could be due to low albumin. Recommend correlation with any symptoms such as diarrhea.   05/21/2018 Tumor Marker   Patient's tumor was tested for the following markers: CA125 Results of the tumor marker test revealed 86   09/07/2018 Tumor Marker   Patient's tumor was tested for the following markers:CA-125 Results of  the tumor marker test revealed 111   09/29/2018 Tumor Marker   Patient's tumor was tested for the following markers: CA-125 Results of the tumor marker test revealed 121.   09/29/2018 - 11/24/2018 Chemotherapy   The patient had carboplatin and gemzar for chemotherapy treatment.  Chemo was stopped due to allergic reaction to carboplatin   10/27/2018 Tumor Marker   Patient's tumor was tested for the following markers: CA-125 Results of the tumor marker test revealed 99.9   11/24/2018 Tumor Marker   Patient's tumor was tested for the following markers: CA-125 Results of the tumor marker test revealed 81.2   12/14/2018 Tumor Marker   Patient's tumor was tested for the following markers: CA-125 Results of the tumor marker test revealed 81.2   12/14/2018 Imaging   1. Mild improvement in peritoneal disease. Peritoneal nodule within the left posterior pelvis contiguous with the left side of vaginal cuff is slightly decreased in size in the interval. Within the right iliac fossa there is a small peritoneal nodule which has decreased in size in the interval. Central small bowel mesenteric lymph node is not significantly changed. Slight decrease in size left periaortic lymph node.    2.  Morphologic features of the liver compatible with cirrhosis. Splenomegaly is noted which may reflect portal venous hypertension.   12/21/2018 Tumor Marker   Patient's tumor was tested for the following markers: CA-125 Results of the tumor marker test revealed 64.2   12/21/2018 -  Chemotherapy   The patient had taxotere for chemotherapy treatment.     03/02/2019 Tumor Marker   Patient's tumor was tested for the following markers: CA-125. 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   No new or progressive metastatic disease within the abdomen or pelvis.   Hepatic cirrhosis and findings of portal venous hypertension. 1.1 cm low-attenuation lesion within left hepatic lobe, which could represent a regenerative nodule or small hepatocellular carcinoma. Recommend abdomen MRI without and with contrast for further characterization.     04/13/2019 Tumor Marker   Patient's tumor was tested for the following markers: CA-125 Results of the tumor marker test revealed 36.8   05/26/2019 Tumor Marker   Patient's tumor was tested for the following markers: CA-125 Results of the tumor marker test revealed 31.6.   06/15/2019 Tumor Marker   Patient's tumor was tested for the following markers: CA-125 Results of the tumor marker test revealed 25.4   07/05/2019 Imaging   1. Stable CT examination. Stable peritoneal thickening in the pelvis and paracolic gutters. Stable solid left deep pelvic implant. Stable trace ascites. No new or progressive metastatic disease. 2. Stable cirrhosis. No discrete liver masses. 3. Stable mild-to-moderate splenomegaly. 4. Aortic Atherosclerosis (ICD10-I70.0).   07/27/2019 Tumor Marker   Patient's tumor was tested for the following markers: CA-125 Results of the tumor marker test revealed 35.4   08/17/2019 Tumor Marker   Patient's tumor was tested for the following markers:  Ca-125 Results of the tumor marker test revealed 35.7.   09/17/2019 - 09/21/2019 Hospital Admission   She was admitted due to esophageal variceal bleeding presents with hematemesis and melanotic stool with epigastric pain.  GI consulted, started on octreotide infusion.  Underwent EGD which showed minimal bleeding, remain on octreotide drip, PPI drip.  Hemoglobin trended down therefore received PRBC transfusion.  Over several days her bleeding subsided, hemodynamically remained stable.    09/17/2019 Procedure   EGD report -  Esophageal mucosal scarring, from prior esophageal banding sessions. - Grade I esophageal varices. - Red blood in the cardia and in the gastric fundus. - Portal hypertensive gastropathy. - Normal duodenal bulb, first portion of the duodenum and second portion of the duodenum. 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 2. Otherwise stable CT exam. Stable peritoneal thickening within the bilateral deep pelvic peritoneum. Stable left deep pelvic implant.No evidence for progressed disease. 3. Stable splenomegaly. 4. Morphologic changes to the liver compatible with cirrhosis. Trace perihepatic and pelvic ascites.   02/22/2020 Imaging   1. New lucent lesion in the left side of the L4 vertebral body measuring 1 cm, concerning for possible osseous metastasis. Consideration for further evaluation with bone scan is suggested. Close attention on follow-up imaging is also recommended to ensure stability. 2. Previously noted peritoneal thickening and nodularity in the low anatomic pelvis has regressed  slightly. 3. Borderline enlarged and mildly enlarged pelvic and retroperitoneal lymph nodes appear similar to slightly increased compared to the prior examination, as above. Continued attention on follow-up studies is recommended.  4. 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 5. Aortic atherosclerosis. 6. Additional incidental findings, as above.   02/22/2020 Tumor Marker   Patient's tumor was tested for the following markers: CA-125 Results of the tumor marker test revealed 30.1   04/06/2020 Tumor Marker   Patient's tumor was tested for the following markers: CA-125 Results of the tumor marker test revealed 35.7   09/11/2020 Tumor Marker   Patient's tumor was tested for the following markers: CA-125. Results of the tumor marker test revealed 26.6.   09/12/2020 Imaging   1. Slow enlargement of a currently 2.9 cubic cm soft tissue nodule along the left adnexa. Tumor deposit not excluded. 2. The left external iliac lymph node has substantially reduced in size, currently 0.9 cm in short axis and formerly 1.4 cm. 3. 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 4. Cirrhosis with portal venous hypertension. 5. Mild distal esophageal wall thickening, probably from esophagitis. Splenomegaly. 6. Scarring in the left kidney. 7. Sigmoid colon diverticulosis. 8. Pelvic floor laxity with small cystocele. 9. Lumbar impingement at L2-3, L3-4, and L4-5.     10/23/2020 Tumor Marker   Patient's tumor was tested for the following markers: CA-125. Results of the tumor marker test revealed 35.3.   12/12/2020 Tumor Marker   Patient's tumor was tested for the following markers: CA-125. Results of the tumor marker test revealed 39.1.   12/13/2020 Imaging   1. Continued increase in size of soft tissue nodule within the left  posterior pelvis adjacent to the sigmoid colon. Cannot exclude small tumor deposit. 2. Similar appearance of thickening and mild nodularity along the peritoneal reflections of the pelvis and pericolic gutters. 3. Morphologic findings of the liver compatible with cirrhosis. 4. Splenomegaly. 5. Aortic Atherosclerosis (ICD10-I70.0).   01/24/2021 Tumor Marker   Patient's tumor was tested for the following markers: CA-125. Results of the tumor marker test revealed 56.2.   02/09/2021 Imaging   1. Mild interval increased size of the solid left  adnexal mass which may represent tumor recurrence/deposit. The mass abuts the adjacent sigmoid colon as well as the left posterior wall of the urinary bladder, invasion not excluded. 2. Otherwise no new mass or lymphadenopathy identified in the abdomen or pelvis. 3. Hepatic cirrhosis. Splenomegaly consistent with portal hypertension.   04/11/2021 Imaging   1. Left posterior pelvic soft tissue mass, adjacent to the sigmoid colon, has slightly increased in size in comparison to recent prior with significant interval increase in size over more remote priors, findings which are highly suspicious for active neoplastic disease involvement. 2. No new discrete omental or peritoneal nodules/masses identified. 3. Similar nodular thickening of the peritoneum along the bilateral pericolic gutters and in the pelvis without ascites. 4. No pathologically enlarged abdominal or pelvic lymph nodes and no solid organ metastases visualized. 5. Cirrhotic hepatic morphology with evidence of portal hypertension including splenomegaly. 6. Sigmoid colonic diverticulosis without findings of acute diverticulitis. 7. Moderate volume of formed stool throughout the colon. 8. Aortic Atherosclerosis (ICD10-I70.0).   04/11/2021 Tumor Marker   Patient's tumor was tested for the following markers: CA-125. Results of the tumor marker test revealed 79.4 .   04/23/2021 - 10/09/2021 Chemotherapy    Patient is on Treatment Plan : Ovarian Docetaxel q21d     04/24/2021 Tumor Marker   Patient's tumor was tested for the following markers: CA-125. Results of the tumor marker test revealed 68.5.   05/16/2021 Tumor Marker   Patient's tumor was tested for the following markers: CA-125. Results of the tumor marker test revealed 68.1.   06/28/2021 Tumor Marker   Patient's tumor was tested for the following markers: CA-125. Results of the tumor marker test revealed 52.   07/16/2021 Imaging   1. Stable cirrhotic changes involving the liver. Associated portal venous hypertension, portal venous collaterals and splenomegaly. No hepatic lesions. 2. Slight interval decrease in size of the left pelvic implant. 3. Stable minimal peritoneal nodularity involving the pericolic gutters bilaterally. No new or progressive findings.     08/09/2021 Tumor Marker   Patient's tumor was tested for the following markers: CA-125. Results of the tumor marker test revealed 44.1.   09/20/2021 Tumor Marker   Patient's tumor was tested for the following markers: CA-125. Results of the tumor marker test revealed 38.2.   10/31/2021 Imaging   1. Stable exam. No new or progressive interval findings. 2. Stable appearance of the soft tissue nodule in the left pelvis. 3. Subtle omental nodule is similar to prior. The minimal peritoneal nodularity described previously in the para colic gutters is similar to prior. 4. Cirrhosis with splenomegaly. 5. Cholelithiasis. 6. Left colonic diverticulosis without diverticulitis. 7. Aortic Atherosclerosis (ICD10-I70.0).   10/31/2021 Tumor Marker   Patient's tumor was tested for the following markers: Ca-125. Results of the tumor marker test revealed 37.7.   11/01/2021 - 11/01/2021 Chemotherapy   Patient is on Treatment Plan : BREAST Docetaxel (77) q21d       PHYSICAL EXAMINATION: ECOG PERFORMANCE STATUS: 1 - Symptomatic but completely ambulatory  Vitals:   11/01/21 0812  BP: (!)  124/52  Pulse: 80  Resp: 18  Temp: 97.8 F (36.6 C)  SpO2: 100%   Filed Weights   11/01/21 0812  Weight: 176 lb 9.6 oz (80.1 kg)    GENERAL:alert, no distress and comfortable NEURO: alert & oriented x 3 with fluent speech, no focal motor/sensory deficits  LABORATORY DATA:  I have reviewed the data as listed    Component Value Date/Time   NA 140 10/30/2021  0732   NA 142 02/06/2017 0742   K 4.2 10/30/2021 0732   K 3.9 02/06/2017 0742   CL 108 10/30/2021 0732   CO2 28 10/30/2021 0732   CO2 22 02/06/2017 0742   GLUCOSE 86 10/30/2021 0732   GLUCOSE 156 (H) 02/06/2017 0742   BUN 18 10/30/2021 0732   BUN 13.5 02/06/2017 0742   CREATININE 0.87 10/30/2021 0732   CREATININE 0.8 02/06/2017 0742   CALCIUM 8.7 (L) 10/30/2021 0732   CALCIUM 8.5 02/06/2017 0742   PROT 6.9 10/30/2021 0732   PROT 6.9 02/06/2017 0742   ALBUMIN 4.0 10/30/2021 0732   ALBUMIN 3.7 02/06/2017 0742   AST 19 10/30/2021 0732   AST 47 (H) 02/06/2017 0742   ALT 17 10/30/2021 0732   ALT 54 02/06/2017 0742   ALKPHOS 86 10/30/2021 0732   ALKPHOS 130 02/06/2017 0742   BILITOT 0.6 10/30/2021 0732   BILITOT 0.65 02/06/2017 0742   GFRNONAA >60 10/30/2021 0732   GFRAA >60 11/09/2019 0953   GFRAA >60 10/26/2019 0722    No results found for: "SPEP", "UPEP"  Lab Results  Component Value Date   WBC 4.8 10/30/2021   NEUTROABS 3.6 10/30/2021   HGB 11.9 (L) 10/30/2021   HCT 37.0 10/30/2021   MCV 96.6 10/30/2021   PLT 100 (L) 10/30/2021      Chemistry      Component Value Date/Time   NA 140 10/30/2021 0732   NA 142 02/06/2017 0742   K 4.2 10/30/2021 0732   K 3.9 02/06/2017 0742   CL 108 10/30/2021 0732   CO2 28 10/30/2021 0732   CO2 22 02/06/2017 0742   BUN 18 10/30/2021 0732   BUN 13.5 02/06/2017 0742   CREATININE 0.87 10/30/2021 0732   CREATININE 0.8 02/06/2017 0742      Component Value Date/Time   CALCIUM 8.7 (L) 10/30/2021 0732   CALCIUM 8.5 02/06/2017 0742   ALKPHOS 86 10/30/2021 0732    ALKPHOS 130 02/06/2017 0742   AST 19 10/30/2021 0732   AST 47 (H) 02/06/2017 0742   ALT 17 10/30/2021 0732   ALT 54 02/06/2017 0742   BILITOT 0.6 10/30/2021 0732   BILITOT 0.65 02/06/2017 0742       RADIOGRAPHIC STUDIES: I have reviewed multiple imaging studies with the patient and her husband I have personally reviewed the radiological images as listed and agreed with the findings in the report. CT ABDOMEN PELVIS W CONTRAST  Result Date: 10/31/2021 CLINICAL DATA:  Ovarian cancer.  Restaging.  * Tracking Code: BO * EXAM: CT ABDOMEN AND PELVIS WITH CONTRAST TECHNIQUE: Multidetector CT imaging of the abdomen and pelvis was performed using the standard protocol following bolus administration of intravenous contrast. RADIATION DOSE REDUCTION: This exam was performed according to the departmental dose-optimization program which includes automated exposure control, adjustment of the mA and/or kV according to patient size and/or use of iterative reconstruction technique. CONTRAST:  157m OMNIPAQUE IOHEXOL 300 MG/ML  SOLN COMPARISON:  07/14/2021 FINDINGS: Lower chest: Scattered areas of subsegmental atelectasis noted in the lung bases. Hepatobiliary: Nodular liver contour again noted compatible with cirrhosis tiny calcified gallstone evident. Gallbladder is nondistended and gallbladder wall is mildly ill-defined. No intrahepatic or extrahepatic biliary dilation. Pancreas: No focal mass lesion. No dilatation of the main duct. No intraparenchymal cyst. No peripancreatic edema. Spleen: Mild splenomegaly.  No focal mass lesion. Adrenals/Urinary Tract: No adrenal nodule or mass. Kidneys unremarkable. No evidence for hydroureter. The urinary bladder appears normal for the degree of distention. Stomach/Bowel:  Stomach is unremarkable. No gastric wall thickening. No evidence of outlet obstruction. Duodenum is normally positioned as is the ligament of Treitz. No small bowel wall thickening. No small bowel dilatation.  The terminal ileum is normal. The appendix is not well visualized, but there is no edema or inflammation in the region of the cecum. No gross colonic mass. No colonic wall thickening. Diverticular changes are noted in the left colon without evidence of diverticulitis. Vascular/Lymphatic: There is mild atherosclerotic calcification of the abdominal aorta without aneurysm. There is no gastrohepatic or hepatoduodenal ligament lymphadenopathy. No retroperitoneal or mesenteric lymphadenopathy. Portal vein, superior mesenteric vein, and splenic vein are patent but prominent. No pelvic sidewall lymphadenopathy. Reproductive: Uterus surgically absent. Soft tissue nodule in the left pelvis measured previously at 2.9 x 2.3 cm is similar at 3.1 x 2.2 cm today (73/2). Other: No intraperitoneal free fluid. Subtle omental nodule on 41/2 is similar to prior. Musculoskeletal: No worrisome lytic or sclerotic osseous abnormality. IMPRESSION: 1. Stable exam. No new or progressive interval findings. 2. Stable appearance of the soft tissue nodule in the left pelvis. 3. Subtle omental nodule is similar to prior. The minimal peritoneal nodularity described previously in the para colic gutters is similar to prior. 4. Cirrhosis with splenomegaly. 5. Cholelithiasis. 6. Left colonic diverticulosis without diverticulitis. 7. Aortic Atherosclerosis (ICD10-I70.0). Electronically Signed   By: Misty Stanley M.D.   On: 10/31/2021 08:59

## 2021-11-01 NOTE — Assessment & Plan Note (Signed)
I have reviewed blood work and imaging studies with the patient and her husband She has stability on imaging study Her tumor marker is now normal We discussed the risk, benefits, side effects of continuing chemotherapy versus taking a treatment break versus trial of a PARP inhibitor with niraparib The risk, benefits, side effects of each option were fully discussed and ultimately, she would like to try niraparib I will get insurance prior authorization with plan to start her treatment on October 1 Given her history of pancytopenia, I will start with reduced dose niraparib I plan to see her within 7 to 10 days after the start date of treatment for toxicity review

## 2021-11-02 ENCOUNTER — Telehealth: Payer: Self-pay

## 2021-11-02 ENCOUNTER — Other Ambulatory Visit (HOSPITAL_COMMUNITY): Payer: Self-pay

## 2021-11-02 ENCOUNTER — Other Ambulatory Visit: Payer: Self-pay | Admitting: Hematology and Oncology

## 2021-11-02 DIAGNOSIS — U071 COVID-19: Secondary | ICD-10-CM | POA: Insufficient documentation

## 2021-11-02 DIAGNOSIS — C482 Malignant neoplasm of peritoneum, unspecified: Secondary | ICD-10-CM

## 2021-11-02 MED ORDER — NIRMATRELVIR/RITONAVIR (PAXLOVID) TABLET (RENAL DOSING)
3.0000 | ORAL_TABLET | Freq: Two times a day (BID) | ORAL | 0 refills | Status: AC
  Filled 2021-11-02: qty 20, 5d supply, fill #0

## 2021-11-02 NOTE — Telephone Encounter (Signed)
Returned her call. She tested positive for covid yesterday afternoon. She had a fever last night up to 104. No fever today. She has cough, nasal congestion and cold symptoms today. She is taking tylenol as needed.  She is asking if you can send Rx for antiviral to [place] and any other recommendations that you may have for her.

## 2021-11-02 NOTE — Telephone Encounter (Signed)
I sent Paxlovid to [facility] I do not recommend her to start zejula on 10/1 We will delay start date to 10/8 I will reschedule her follow-up appt by another week, will send LOS

## 2021-11-02 NOTE — Telephone Encounter (Signed)
Called her back and given below message. She verbalized understanding. She appreciated the call and will call the office back for concerns.  Sent a message to [facility], she said that she is trying to call him back regarding yesterdays call.

## 2021-11-05 ENCOUNTER — Telehealth: Payer: Self-pay

## 2021-11-05 NOTE — Telephone Encounter (Signed)
Oral Oncology Pharmacist Encounter  Received new prescription for niraparib Olivia Benson) for the treatment of peritoneal carcinomatosis, planned duration until disease progression or unacceptable toxicity.  Labs from 10/30/21 assessed, no interventions needed. Prescription dose and frequency assessed, dose reduced per MD note.  Current medication list in Epic reviewed, no DDIs with Zejula identified.  Evaluated chart and no patient barriers to medication adherence noted.   Patient agreement for treatment documented in MD note on 11/01/2021.  Prescription has been e-scribed to the [facility] for benefits analysis and approval.  Oral Oncology Clinic will continue to follow for insurance authorization, copayment issues, initial counseling and start date.  Drema Halon, PharmD Hematology/Oncology Clinical Pharmacist [facility] [phone] 11/05/2021 4:22 PM

## 2021-11-15 NOTE — Telephone Encounter (Signed)
Oral Chemotherapy Pharmacist Encounter  I spoke with patient today for overview of: Zejula for the maintenance treatment of peritoneal carcinomatosis, planned duration until disease progression or unacceptable toxicity.   Counseled patient on administration, dosing, side effects, monitoring, drug-food interactions, safe handling, storage, and disposal.  Patient will take Zejula 146m capsules, 2 capsules (2076m by mouth once daily, with a glass of water, without regard to food.  Zejula start date: 11/18/21   Adverse effects include but are not limited to: nausea, vomiting, increased blood pressure, mouth sores, fatigue, constipation, decreased blood counts, and joint pain. Myelodysplastic syndrome/acute myeloid leukemia (MDS/AML) have been reported (rarely) in clinical trials.  Patient updated about close blood count monitoring at the initiation of Zejula for detection of thrombocytopenia.  Patient has anti-emetic on hand and knows to take it if nausea develops.  Patient will notify me or the office if she is running low on her Zofran.   Reviewed with patient importance of keeping a medication schedule and plan for any missed doses. No barriers to medication adherence identified.  Medication reconciliation performed and medication/allergy list updated.  Insurance authorization for ZeNoel Journeyas been obtained.  Patient informed the pharmacy will reach out 5-7 days prior to needing next fill of Zejula to coordinate continued medication acquisition to prevent break in therapy.  All questions answered.  Mrs. Goral voiced understanding and appreciation.   Medication education handout placed in mail for patient. Patient knows to call the office with questions or concerns. Oral Chemotherapy Clinic phone number provided to patient.   KaDrema HalonPharmD Hematology/Oncology Clinical Pharmacist [phone]/06/2021   10:08 AM

## 2021-11-20 ENCOUNTER — Inpatient Hospital Stay: Payer: Medicare Other

## 2021-11-20 ENCOUNTER — Ambulatory Visit: Payer: Medicare Other | Admitting: Hematology and Oncology

## 2021-11-20 ENCOUNTER — Other Ambulatory Visit: Payer: Self-pay | Admitting: Neurology

## 2021-11-20 DIAGNOSIS — G2581 Restless legs syndrome: Secondary | ICD-10-CM

## 2021-11-22 ENCOUNTER — Inpatient Hospital Stay: Payer: Medicare Other

## 2021-11-22 ENCOUNTER — Inpatient Hospital Stay: Payer: Medicare Other | Admitting: Hematology and Oncology

## 2021-11-23 NOTE — Progress Notes (Unsigned)
Assessment/Plan:   1.  Restless leg secondary to iron deficiency anemia  -Continue rotigotine patch, 4 mg daily.  -Patient following closely with Dr. Alvy Bimler and her ferritin is very normal at 171. 2.  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  Pancytopenia  -Chemotherapy-induced.  Medications being reduced because of this. 4.  History of small subdural hematoma in November, 2020 after a fall  -Nothing further to do.  Has seen Dr. Saintclair Halsted. 5.  Chronic LBP  -handicap sticker filled out, although equally disabled from CA 6.  Chemotherapy-induced neuropathy  -On gabapentin, 900 mg twice per day.  Being managed by Dr. Alvy Bimler. Subjective:   Olivia Benson was seen today in follow up for RLS.  My previous records as well as any outside records available were reviewed prior to todays visit.  Pt is currently on neupro, 4 mg.  She is on chemotherapy.  She is experiencing worsening neuropathy.  PREVIOUS MEDICATIONS:  Neupro, 78m  CURRENT MEDICATIONS:  Outpatient Encounter Medications as of 11/27/2021  Medication Sig   calcium elemental as carbonate (BARIATRIC TUMS ULTRA) 400 MG chewable tablet Chew 1 tablet by mouth 2 (two) times daily.   Cholecalciferol (VITAMIN D) 50 MCG (2000 UT) tablet Take 2,000 Units by mouth at bedtime.   dexamethasone (DECADRON) 4 MG tablet Take 1 tablet the day before chemo and daily after chemo for 2 days. Please dispense 18 tabs for all 6 cycles of treatment   gabapentin (NEURONTIN) 300 MG capsule Take 3 capsules (900 mg total) by mouth 2 (two) times daily.   HYDROcodone-acetaminophen (NORCO/VICODIN) 5-325 MG tablet Take 1 tablet by mouth every 6 (six) hours as needed for moderate pain.   insulin degludec (TRESIBA FLEXTOUCH) 100 UNIT/ML FlexTouch Pen Inject 28 Units into the skin daily.   lidocaine-prilocaine (EMLA) cream Apply to Porta-cath  1-2 hours prior to access as directed. (Patient taking differently: Apply 1 application. topically daily  as needed (port access).)   niraparib tosylate 200 MG TABS Take 200 mg by mouth daily.   ondansetron (ZOFRAN) 8 MG tablet TAKE 1 TABLET(8 MG) BY MOUTH EVERY 8 HOURS AS NEEDED   OZEMPIC, 0.25 OR 0.5 MG/DOSE, 2 MG/1.5ML SOPN Inject 0.25 mg into the skin every Saturday.   pantoprazole (PROTONIX) 40 MG tablet Take 1 tablet (40 mg total) by mouth daily.   rotigotine (NEUPRO) 4 MG/24HR APPLY 1 PATCH TOPICALLY TO THE SKIN DAILY   No facility-administered encounter medications on file as of 11/27/2021.     Objective:   PHYSICAL EXAMINATION:    VITALS:   There were no vitals filed for this visit.    GEN:  The patient appears stated age and is in NAD. HEENT:  Normocephalic, atraumatic.  The mucous membranes are moist. The superficial temporal arteries are without ropiness or tenderness.   Neurological examination:  Orientation: The patient is alert and oriented x3. Cranial nerves: There is good facial symmetry.The speech is fluent and clear. Soft palate rises symmetrically and there is no tongue deviation. Hearing is intact to conversational tone. Sensation: Sensation is intact to light touch throughout Motor: Strength is at least antigravity x4.  Movement examination: Tone: There is normal tone in the UE/LE Abnormal movements:  no tremor.  No myoclonus.  No asterixis.   Coordination:  There is no decremation with RAM's. Gait and Station: The patient pushes off to arise.  She is antalgic.  I have reviewed and interpreted the following labs independently   Chemistry  Component Value Date/Time   NA 140 10/30/2021 0732   NA 142 02/06/2017 0742   K 4.2 10/30/2021 0732   K 3.9 02/06/2017 0742   CL 108 10/30/2021 0732   CO2 28 10/30/2021 0732   CO2 22 02/06/2017 0742   BUN 18 10/30/2021 0732   BUN 13.5 02/06/2017 0742   CREATININE 0.87 10/30/2021 0732   CREATININE 0.8 02/06/2017 0742      Component Value Date/Time   CALCIUM 8.7 (L) 10/30/2021 0732   CALCIUM 8.5 02/06/2017  0742   ALKPHOS 86 10/30/2021 0732   ALKPHOS 130 02/06/2017 0742   AST 19 10/30/2021 0732   AST 47 (H) 02/06/2017 0742   ALT 17 10/30/2021 0732   ALT 54 02/06/2017 0742   BILITOT 0.6 10/30/2021 0732   BILITOT 0.65 02/06/2017 0742     Lab Results  Component Value Date   WBC 4.8 10/30/2021   HGB 11.9 (L) 10/30/2021   HCT 37.0 10/30/2021   MCV 96.6 10/30/2021   PLT 100 (L) 10/30/2021   Lab Results  Component Value Date   FERRITIN 92 06/26/2021     Total time spent on today's visit was *** minutes, including both face-to-face time and nonface-to-face time.  Time included that spent on review of records (prior notes available to me/labs/imaging if pertinent), discussing treatment and goals, answering patient's questions and coordinating care.  Cc:  Jolinda Croak, MD

## 2021-11-27 ENCOUNTER — Other Ambulatory Visit (HOSPITAL_COMMUNITY): Payer: Self-pay

## 2021-11-27 ENCOUNTER — Ambulatory Visit (INDEPENDENT_AMBULATORY_CARE_PROVIDER_SITE_OTHER): Payer: Medicare Other | Admitting: Neurology

## 2021-11-27 ENCOUNTER — Encounter: Payer: Self-pay | Admitting: Neurology

## 2021-11-27 VITALS — BP 131/82 | HR 58 | Ht 62.0 in | Wt 178.0 lb

## 2021-11-27 DIAGNOSIS — G2581 Restless legs syndrome: Secondary | ICD-10-CM | POA: Diagnosis not present

## 2021-11-27 NOTE — Patient Instructions (Signed)
The physicians and staff at [facility] are committed to providing excellent care. You may receive a survey requesting feedback about your experience at our office. We strive to receive "very good" responses to the survey questions. If you feel that your experience would prevent you from giving the office a "very good " response, please contact our office to try to remedy the situation. We may be reached at [phone]. Thank you for taking the time out of your busy day to complete the survey.

## 2021-11-29 ENCOUNTER — Inpatient Hospital Stay: Payer: Medicare Other | Attending: Hematology and Oncology

## 2021-11-29 ENCOUNTER — Other Ambulatory Visit (HOSPITAL_COMMUNITY): Payer: Self-pay

## 2021-11-29 ENCOUNTER — Encounter: Payer: Self-pay | Admitting: Hematology and Oncology

## 2021-11-29 ENCOUNTER — Other Ambulatory Visit: Payer: Self-pay

## 2021-11-29 ENCOUNTER — Inpatient Hospital Stay (HOSPITAL_BASED_OUTPATIENT_CLINIC_OR_DEPARTMENT_OTHER): Payer: Medicare Other | Admitting: Hematology and Oncology

## 2021-11-29 VITALS — BP 150/75 | HR 70 | Temp 97.4°F | Resp 18 | Ht 62.0 in | Wt 176.2 lb

## 2021-11-29 DIAGNOSIS — C482 Malignant neoplasm of peritoneum, unspecified: Secondary | ICD-10-CM | POA: Diagnosis present

## 2021-11-29 DIAGNOSIS — R188 Other ascites: Secondary | ICD-10-CM | POA: Insufficient documentation

## 2021-11-29 DIAGNOSIS — K7460 Unspecified cirrhosis of liver: Secondary | ICD-10-CM | POA: Insufficient documentation

## 2021-11-29 DIAGNOSIS — I10 Essential (primary) hypertension: Secondary | ICD-10-CM | POA: Insufficient documentation

## 2021-11-29 DIAGNOSIS — D61818 Other pancytopenia: Secondary | ICD-10-CM | POA: Diagnosis not present

## 2021-11-29 DIAGNOSIS — M545 Low back pain, unspecified: Secondary | ICD-10-CM | POA: Insufficient documentation

## 2021-11-29 DIAGNOSIS — J90 Pleural effusion, not elsewhere classified: Secondary | ICD-10-CM | POA: Insufficient documentation

## 2021-11-29 DIAGNOSIS — Z79899 Other long term (current) drug therapy: Secondary | ICD-10-CM | POA: Insufficient documentation

## 2021-11-29 DIAGNOSIS — G8929 Other chronic pain: Secondary | ICD-10-CM | POA: Diagnosis not present

## 2021-11-29 DIAGNOSIS — R161 Splenomegaly, not elsewhere classified: Secondary | ICD-10-CM | POA: Insufficient documentation

## 2021-11-29 DIAGNOSIS — M549 Dorsalgia, unspecified: Secondary | ICD-10-CM

## 2021-11-29 LAB — CBC WITH DIFFERENTIAL/PLATELET
Abs Immature Granulocytes: 0.01 10*3/uL (ref 0.00–0.07)
Basophils Absolute: 0 10*3/uL (ref 0.0–0.1)
Basophils Relative: 0 %
Eosinophils Absolute: 0.1 10*3/uL (ref 0.0–0.5)
Eosinophils Relative: 4 %
HCT: 40.2 % (ref 36.0–46.0)
Hemoglobin: 13.1 g/dL (ref 12.0–15.0)
Immature Granulocytes: 0 %
Lymphocytes Relative: 27 %
Lymphs Abs: 0.9 10*3/uL (ref 0.7–4.0)
MCH: 30.8 pg (ref 26.0–34.0)
MCHC: 32.6 g/dL (ref 30.0–36.0)
MCV: 94.4 fL (ref 80.0–100.0)
Monocytes Absolute: 0.1 10*3/uL (ref 0.1–1.0)
Monocytes Relative: 4 %
Neutro Abs: 2.1 10*3/uL (ref 1.7–7.7)
Neutrophils Relative %: 65 %
Platelets: 79 10*3/uL — ABNORMAL LOW (ref 150–400)
RBC: 4.26 MIL/uL (ref 3.87–5.11)
RDW: 14.1 % (ref 11.5–15.5)
WBC: 3.3 10*3/uL — ABNORMAL LOW (ref 4.0–10.5)
nRBC: 0 % (ref 0.0–0.2)

## 2021-11-29 LAB — COMPREHENSIVE METABOLIC PANEL
ALT: 17 U/L (ref 0–44)
AST: 19 U/L (ref 15–41)
Albumin: 4.2 g/dL (ref 3.5–5.0)
Alkaline Phosphatase: 98 U/L (ref 38–126)
Anion gap: 6 (ref 5–15)
BUN: 19 mg/dL (ref 8–23)
CO2: 29 mmol/L (ref 22–32)
Calcium: 9.1 mg/dL (ref 8.9–10.3)
Chloride: 106 mmol/L (ref 98–111)
Creatinine, Ser: 1.14 mg/dL — ABNORMAL HIGH (ref 0.44–1.00)
GFR, Estimated: 51 mL/min — ABNORMAL LOW (ref >60)
Glucose, Bld: 116 mg/dL — ABNORMAL HIGH (ref 70–99)
Potassium: 4.1 mmol/L (ref 3.5–5.1)
Sodium: 141 mmol/L (ref 135–145)
Total Bilirubin: 0.7 mg/dL (ref 0.3–1.2)
Total Protein: 7.5 g/dL (ref 6.5–8.1)

## 2021-11-29 MED ORDER — NIRAPARIB TOSYLATE 100 MG PO TABS
100.0000 mg | ORAL_TABLET | Freq: Every day | ORAL | 9 refills | Status: DC
  Filled 2021-11-29 (×2): qty 30, 30d supply, fill #0
  Filled 2021-12-18: qty 30, 30d supply, fill #1
  Filled 2022-01-16 – 2022-01-23 (×2): qty 30, 30d supply, fill #2
  Filled 2022-02-19 – 2022-02-28 (×2): qty 30, 30d supply, fill #3
  Filled 2022-03-20: qty 30, 30d supply, fill #4
  Filled 2022-04-22: qty 30, 30d supply, fill #5
  Filled 2022-05-21: qty 30, 30d supply, fill #6
  Filled 2022-06-18: qty 30, 30d supply, fill #7
  Filled 2022-07-16: qty 30, 30d supply, fill #8
  Filled 2022-08-09: qty 30, 30d supply, fill #9

## 2021-11-29 MED ORDER — HYDROCODONE-ACETAMINOPHEN 5-325 MG PO TABS: 1.0000 | ORAL_TABLET | Freq: Four times a day (QID) | ORAL | 0 refills | Status: DC | PRN

## 2021-11-29 NOTE — Assessment & Plan Note (Signed)
Her blood pressure is intermittently elevated We will continue to monitor closely

## 2021-11-29 NOTE — Assessment & Plan Note (Signed)
She has chronic lower back pain, stable on prescribed hydrocodone I refilled her prescription today

## 2021-11-29 NOTE — Assessment & Plan Note (Signed)
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

## 2021-11-29 NOTE — Progress Notes (Signed)
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   All questions were answered. The patient knows to call the clinic with any problems, questions or concerns. The total time spent in the appointment was 30 minutes encounter with patients including review of chart and various tests results, discussions about plan of care and coordination of care plan   [name], MD 11/29/2021 10:37 AM  INTERVAL HISTORY: Please see below for problem oriented charting. she returns for treatment follow-up on maintenance niraparib She denies side effects such as infection or bleeding She had 1 brief episode of nausea that has since resolved Her chronic back pain remains stable  REVIEW OF SYSTEMS:   Constitutional: Denies fevers, chills or abnormal weight loss Eyes: Denies blurriness of vision Ears, nose, mouth, throat, and face: Denies mucositis or sore throat Respiratory: Denies cough, dyspnea or wheezes Cardiovascular: Denies palpitation, chest discomfort or lower extremity swelling Gastrointestinal:   Denies nausea, heartburn or change in bowel habits Skin: Denies abnormal skin rashes Lymphatics: Denies new lymphadenopathy or easy bruising Neurological:Denies numbness, tingling or new weaknesses Behavioral/Psych: Mood is stable, no new changes  All other systems were reviewed with the patient and are negative.  I have reviewed the past medical history, past surgical history, social history and family history with the patient and they are unchanged from previous note.  ALLERGIES:  is allergic to carboplatin, shellfish allergy, vancomycin, benadryl [diphenhydramine hcl], penicillins, and tape.  MEDICATIONS:  Current Outpatient Medications  Medication Sig Dispense Refill   calcium elemental as carbonate (BARIATRIC TUMS ULTRA) 400 MG chewable tablet Chew 1 tablet by mouth 2 (two) times daily.     Cholecalciferol (VITAMIN D) 50 MCG (2000 UT) tablet Take 2,000 Units by mouth at bedtime.     gabapentin (NEURONTIN) 300 MG capsule Take 3 capsules (900 mg total) by mouth 2 (two) times daily. 120 capsule 11   HYDROcodone-acetaminophen (NORCO/VICODIN) 5-325 MG tablet Take 1 tablet by mouth every 6 (six) hours as needed for moderate pain. 60 tablet 0   insulin degludec (TRESIBA FLEXTOUCH) 100 UNIT/ML FlexTouch Pen Inject 28 Units into the skin daily.     lidocaine-prilocaine (EMLA) cream Apply to Porta-cath  1-2 hours prior to access as directed. (Patient taking differently: Apply 1 application  topically daily as needed (port access).) 30 g 9   niraparib tosylate (ZEJULA) 100 MG tablet Take 1 tablet (100 mg total) by mouth daily. 30 tablet 9   ondansetron (ZOFRAN) 8 MG tablet TAKE 1 TABLET(8 MG) BY MOUTH EVERY 8 HOURS AS NEEDED 60 tablet 1   OZEMPIC, 0.25 OR 0.5 MG/DOSE, 2 MG/1.5ML SOPN Inject 0.25 mg into the skin every  Saturday.     pantoprazole (PROTONIX) 40 MG tablet Take 1 tablet (40 mg total) by mouth daily.     rotigotine (NEUPRO) 4 MG/24HR APPLY 1 PATCH TOPICALLY TO THE SKIN DAILY 90 patch 0    No current facility-administered medications for this visit.    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 Had allergic reaction to carboplatin. Avastin was stopped due to GI hemorrhage   Primary peritoneal carcinomatosis ([place])  09/06/2011 Imaging   CT findings consistent with cirrhotic changes involving the liver.  No worrisome liver mass.  There are associated portal venous collaterals and splenomegaly consistent with portal venous hypertension. 2.  No other significant upper abdominal findings   11/08/2013 Imaging   US showed ascites   11/08/2013 Initial Diagnosis   Patient had ~ 2 months of some urinary incontinence, saw Dr Ronita Hipps in 10-2013, with pelvic mass on exam    11/12/2013 Imaging   There are findings of progressive hepatic cirrhosis with a large amount of ascites and mild splenomegaly. There are venous collaterals present. 2. There is abnormal thickening of the peritoneal surface along the left mid and lower abdominal wall. There is abnormal soft tissue density in the pelvis as well which could reflect the clinically suspected ovarian malignancy but the findings are nondiagnostic. 3. There is a new small left pleural effusion. 4. There is no acute bowel abnormality.   11/18/2013 Imaging   Successful ultrasound guided diagnostic and therapeutic paracentesis yielding 4.1 liters of ascites.     11/18/2013 Pathology Results   PERITONEAL/ASCITIC FLUID (SPECIMEN 1 OF 1 COLLECTED 11/18/13): SEROUS CARCINOMA, PLEASE SEE COMMENT.   12/01/2013 Tumor Marker   Patient's tumor was tested for the following markers: CA125 Results of the tumor marker test revealed 1938   12/03/2013 Imaging   Successful ultrasound-guided therapeutic paracentesis yielding 3.6 liters of peritoneal fluid.   12/07/2013 - 05/30/2014 Chemotherapy   She received 3 cycles of carboplatin and Taxol, interrupted  cycle 4 for surgery.  She subsequently completed 3 more cycles of chemotherapy after surgery   12/20/2013 Imaging   Successful ultrasound-guided diagnostic and therapeutic paracentesis yielding 2.2 liters of peritoneal fluid   12/20/2013 Pathology Results   PERITONEAL/ASCITIC FLUID(SPECIMEN 1 OF 1 COLLECTED 12/20/13): MALIGNANT CELLS CONSISTENT WITH SEROUS CARCINOMA   01/13/2014 Tumor Marker   Patient's tumor was tested for the following markers: CA125 Results of the tumor marker test revealed 227   02/07/2014 Tumor Marker   Patient's tumor was tested for the following markers: CA125 Results of the tumor marker test revealed 130   02/15/2014 Genetic Testing   Genetics testing from 02-2014 normal ([employer] panel)   02/23/2014 Imaging   Interval decrease in ascites. 2. Morphologic changes in the liver consistent with cirrhosis. 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 Portal vein remains patent at this time. 3. Persistent but decreased abnormal soft tissue attenuation tracking in the omental fat. This may be secondary to interval improvement in metastatic disease. 4. Peritoneal thickening seen in the left abdomen and pelvis on the previous study has decreased and nearly resolved in the interval. This also suggests interval improvement in metastatic disease.     03/07/2014 Procedure   Ultrasound and fluoroscopically guided right internal jugular single lumen power port catheter insertion. Tip in the SVC/RA junction. Catheter ready for use.   03/17/2014 Tumor Marker   Patient's tumor was tested for the following markers: CA125 Results of the  tumor marker test revealed 45   03/29/2014 Pathology Results   1. Omentum, resection for tumor - HIGH GRADE SEROUS CARCINOMA, SEE COMMENT. 2. Ovary and fallopian tube, right - HIGH GRADE SEROUS CARCINOMA, SEE COMMENT. 3. Ovary and fallopian tube, left - HIGH GRADE SEROUS CARCINOMA, SEE COMMENT. Diagnosis Note 1. Nests  and clusters of malignant cells are invading the omental tissue (part #1) with associated fibrosis. The cells are pleomorphic with prominent nucleoli. There are scattered psammoma bodies. The ovaries are atrophic and exhibit multiple foci of surface based invasive carcinoma and associated fibrosis. The fallopian tubes have a few foci of carcinoma, also superficially located. There are no precursor lesions noted in the ovaries or fallopian tubes (the entire tubes and ovaries were submitted for evaluation). While there is some retraction artifact, there are several foci suspicious for lymphovascular invasion. 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 Given the fibrosis, there does appear to be a small amount of treatment effect, however, there is abundant residual tumor.    03/29/2014 Surgery   Procedure(s) Performed: 1. Exploratory laparotomy with bilateral salpingo-oophorectomy, omentectomy radical tumor debulking for ovarian cancer .   Surgeon: Thereasa Solo, MD.   Assistant Surgeon: Lahoma Crocker, M.D. Assistant: (an MD assistant was necessary for tissue manipulation, retraction and positioning due to the complexity of the case and hospital policies).  Operative Findings: 10cm omental cake from hepatic to splenic flexure, densely adherent to transverse colon. Milliary studding of tumor implants (<62m, too numerous in number to count) adherent to the mesentery of the small bowel and small bowel wall. Small volume (100cc) ascites. Small ovaries bilaterally, densely adherent to the pelvic cul de sac. Left ovary and tube densely adherent to sigmoid colon. Cirrhotic liver with hepatomegaly and splenomegaly.     This represented an optimal cytoreduction (R1) with visible disease residual on the bowel wall and mesentery (millial, <373mimplants).      04/18/2014 Tumor  Marker   Patient's tumor was tested for the following markers: CA125 Results of the tumor marker test revealed 122   05/16/2014 Tumor Marker   Patient's tumor was tested for the following markers: CA125 Results of the tumor marker test revealed 30   06/10/2014 Tumor Marker   Patient's tumor was tested for the following markers: CA125 Results of the tumor marker test revealed 19   07/04/2014 Imaging   Interval improvement in the appearance of peritoneal metastasis secondary to ovarian cancer. 2. 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     08/18/2014 Tumor Marker   Patient's tumor was tested for the following markers: CA125 Results of the tumor marker test revealed 16   10/13/2014 Tumor Marker   Patient's tumor was tested for the following markers: CA125 Results of the tumor marker test revealed 14   11/21/2014 Tumor Marker   Patient's tumor was tested for the following markers: CA125 Results of the tumor marker test revealed 16   12/28/2014 Tumor Marker   Patient's tumor was tested for the following markers: CA125 Results of the tumor marker test revealed 762   02/27/2015 Tumor Marker   Patient's tumor was tested for the following markers: CA125 Results of the tumor marker test revealed 20.2   03/22/2015 Imaging   Filler appearance to the prior exam, with very subtle fluid tracking along portions of the liver, and very minimal nodularity along the right  paracolic gutter representing residua from the prior peritoneal cancer. The current abnormalities could simply be post therapy findings rather than necessarily representing residual malignancy. No new or enlarging lesions are identified. 2. Hepatic cirrhosis and splenomegaly. There is some gastric varices suggesting portal venous hypertension. 3. Left foraminal stenosis at L4-5 due to spurring. There is likely also some central narrowing of the thecal sac at this level. 4. Bibasilar scarring. 5. Chronic mild left mid  kidney scarring   03/27/2015 Tumor Marker   Patient's tumor was tested for the following markers: CA125 Results of the tumor marker test revealed 25.3   04/24/2015 Imaging   Disc bulge L2-3 with mild spinal stenosis and right foraminal encroachment. 2. Disc bulge L3-4 with mild spinal stenosis. 3. Multifactorial spinal, left lateral recess and foraminal stenosis L4-5. There is grade 1 anterolisthesis without evident dynamic instability.   05/29/2015 Tumor Marker   Patient's tumor was tested for the following markers: CA125 Results of the tumor marker test revealed 29.9   08/21/2015 Tumor Marker   Patient's tumor was tested for the following markers: CA125 Results of the tumor marker test revealed 45   09/18/2015 Tumor Marker   Patient's tumor was tested for the following markers: CA125 Results of the tumor marker test revealed 47   09/27/2015 Imaging   New small bowel mesenteric nodule and enlarged left external iliac lymph node, highly worrisome for metastatic disease. 2. Cirrhosis and splenomegaly   10/10/2015 Tumor Marker   Patient's tumor was tested for the following markers: CA125 Results of the tumor marker test revealed 53.4   10/10/2015 - 12/11/2015 Chemotherapy   She received 4 cycles of carboplatin only   11/20/2015 Tumor Marker   Patient's tumor was tested for the following markers: CA125 Results of the tumor marker test revealed 41.3   12/20/2015 Imaging   CT: Mild left external iliac lymphadenopathy is slightly decreased. Mildly enlarged lower mesenteric nodule is slightly decreased. 2. No new or progressive metastatic disease in the abdomen or pelvis. 3. Cirrhosis. Stable subcentimeter hypodense left liver lobe lesion. No new liver lesions. 4. Stable mild splenomegaly.  No ascites. 5. Mild sigmoid diverticulosis.   01/01/2016 -  Anti-estrogen oral therapy   She was placed on tamoxifen. Had letrozole initially but switched to tamoxifen due to poor tolerance   01/15/2016  Tumor Marker   Patient's tumor was tested for the following markers: CA125 Results of the tumor marker test revealed 31.4   02/19/2016 Tumor Marker   Patient's tumor was tested for the following markers: CA125 Results of the tumor marker test revealed 36.5   05/13/2016 Imaging   No evidence of disease progression within the abdomen or pelvis. Previously noted small central mesenteric nodule and left external iliac node appear slightly smaller. 2. Stable changes of hepatic cirrhosis and portal hypertension with associated splenomegaly. No new or enlarging hepatic lesions are identified. 3. No acute findings.   05/13/2016 Tumor Marker   Patient's tumor was tested for the following markers: CA125 Results of the tumor marker test revealed 41.2   06/24/2016 Tumor Marker   Patient's tumor was tested for the following markers: CA125 Results of the tumor marker test revealed 47.3   08/20/2016 Imaging   1. The left external iliac lymph node is slightly increased in size compared to the prior exam, currently 1.1 cm and previously 0.9 cm in diameter. 2. Stable central mesenteric lymph node at 0.9 cm in diameter. 3. Stable trace free pelvic fluid and stable slight thickening along the  right paracolic gutter, without well-defined peritoneal nodularity. 4. Other imaging findings of potential clinical significance: Subsegmental atelectasis or scarring in the lung bases. Hepatic cirrhosis. Left renal scarring. Mild splenomegaly. Sigmoid colon diverticulosis. Impingement at L4-5 due to spondylosis and degenerative disc disease broad Schmorl' s nodes at L3-L4. Pelvic floor laxity   08/23/2016 Tumor Marker   Patient's tumor was tested for the following markers: CA125 Results of the tumor marker test revealed 80.2   09/04/2016 Imaging   LV EF: 60% -   65%   09/12/2016 Tumor Marker   Patient's tumor was tested for the following markers: CA125 Results of the tumor marker test revealed 98.5   09/12/2016 - 01/29/2018  Chemotherapy   The patient received Doxil and Avastin. Avastin was discontinued in Dec 2019 due to GI hemorrhage   09/26/2016 Tumor Marker   Patient's tumor was tested for the following markers: CA125 Results of the tumor marker test revealed 68.9   10/10/2016 Tumor Marker   Patient's tumor was tested for the following markers: CA125 Results of the tumor marker test revealed 65.6   11/07/2016 Tumor Marker   Patient's tumor was tested for the following markers: CA125 Results of the tumor marker test revealed 46.4   11/29/2016 Imaging   Stable mild peritoneal thickening, suspicious for peritoneal carcinomatosis. No new or progressive disease identified. No evidence of ascites.  No significant change in 10 mm left external iliac and 8 mm mesenteric lymph nodes.  Stable hepatic cirrhosis, and splenomegaly consistent with portal venous hypertension.  Colonic diverticulosis, without radiographic evidence of diverticulitis.   12/02/2016 Imaging   Normal LV size with EF 55%. Basal inferior and basal inferoseptal hypokinesis. Normal RV size and systolic function. No significant valvular abnormalities.   12/05/2016 Tumor Marker   Patient's tumor was tested for the following markers: CA125 Results of the tumor marker test revealed 49.1   01/09/2017 Tumor Marker   Patient's tumor was tested for the following markers: CA125 Results of the tumor marker test revealed 47.2   02/06/2017 Tumor Marker   Patient's tumor was tested for the following markers: CA125 Results of the tumor marker test revealed 44.1   02/18/2017 Imaging   Stable mild peritoneal thickening. No new or progressive disease identified within the abdomen or pelvis.  Stable tiny sub-cm left external iliac and mesenteric lymph nodes.  Stable hepatic cirrhosis, and splenomegaly consistent with portal venous hypertension. No evidence of hepatic neoplasm.  Colonic diverticulosis, without radiographic evidence of diverticulitis.    03/06/2017 Tumor Marker   Patient's tumor was tested for the following markers: CA125 Results of the tumor marker test revealed 50.4   03/17/2017 Imaging   - Left ventricle: The cavity size was normal. Wall thickness was normal. Systolic function was normal. The estimated ejection fraction was in the range of 55% to 60%. Wall motion was normal; there were no regional wall motion abnormalities. Features are consistent with a pseudonormal left ventricular filling pattern, with concomitant abnormal relaxation and increased filling pressure (grade 2 diastolic dysfunction). - Mitral valve: There was mild regurgitation. - Left atrium: The atrium was mildly dilated   04/03/2017 Tumor Marker   Patient's tumor was tested for the following markers: CA125 Results of the tumor marker test revealed 47.2   05/14/2017 Imaging   CT scan of abdomen and pelvis Stable mild peritoneal thickening. No new or progressive disease within the abdomen or pelvis.  Stable hepatic cirrhosis. Stable splenomegaly, consistent with portal venous hypertension. No evidence of hepatic neoplasm.  05/15/2017 Tumor Marker   Patient's tumor was tested for the following markers: CA125 Results of the tumor marker test revealed 56.1   06/12/2017 Tumor Marker   Patient's tumor was tested for the following markers: CA125 Results of the tumor marker test revealed 49.2   07/10/2017 Tumor Marker   Patient's tumor was tested for the following markers: CA125 Results of the tumor marker test revealed 46.3   08/07/2017 Tumor Marker   Patient's tumor was tested for the following markers: CA125 Results of the tumor marker test revealed 52.9   08/20/2017 Imaging   1. Mild omental/peritoneal haziness, unchanged. No evidence of new metastatic disease. 2. Cirrhosis with splenomegaly.   09/04/2017 Tumor Marker   Patient's tumor was tested for the following markers: CA125 Results of the tumor marker test revealed 48.5   11/13/2017 Tumor Marker    Patient's tumor was tested for the following markers: CA125 Results of the tumor marker test revealed 55.8   12/17/2017 Imaging   1. No findings to suggest metastatic disease in the abdomen or pelvis. 2. Severe hepatic cirrhosis with evidence of portal hypertension, as demonstrated by dilated portal vein, splenomegaly and portosystemic collateral pathways, including gastric and esophageal varices.  3. Colonic diverticulosis without evidence of acute diverticulitis at this time. 4. Additional incidental findings, as above.   01/16/2018 Tumor Marker   Patient's tumor was tested for the following markers: CA125 Results of the tumor marker test revealed 63.7   02/10/2018 Procedure   EGD - Recently bleeding grade III and large (> 5 mm) esophageal varices. Completely eradicated. Banded. - Portal hypertensive gastropathy. - Normal examined duodenum. - No specimens collected   02/13/2018 Tumor Marker   Patient's tumor was tested for the following markers: CA125 Results of the tumor marker test revealed 82.7   02/25/2018 Imaging   Bone density showed mild osteopenia   04/08/2018 Tumor Marker   Patient's tumor was tested for the following markers: CA125 Results of the tumor marker test revealed 82.7   04/08/2018 Imaging   1. No definite omental or peritoneal surface lesions. However, there are 2 slowly enlarging lymph nodes noted. One is in the small bowel mesentery and the other is in the left deep pelvis. Could not exclude recurrent disease. 2. Stable advanced cirrhotic changes involving the liver with portal venous hypertension, portal venous collaterals, esophageal varices and splenomegaly. No worrisome hepatic lesions. 3. Diffuse wall thickening of the colon could suggest diffuse colitis or could be due to low albumin. Recommend correlation with any symptoms such as diarrhea.   05/21/2018 Tumor Marker   Patient's tumor was tested for the following markers: CA125 Results of the tumor  marker test revealed 86   09/07/2018 Tumor Marker   Patient's tumor was tested for the following markers:CA-125 Results of the tumor marker test revealed 111   09/29/2018 Tumor Marker   Patient's tumor was tested for the following markers: CA-125 Results of the tumor marker test revealed 121.   09/29/2018 - 11/24/2018 Chemotherapy   The patient had carboplatin and gemzar for chemotherapy treatment.  Chemo was stopped due to allergic reaction to carboplatin   10/27/2018 Tumor Marker   Patient's tumor was tested for the following markers: CA-125 Results of the tumor marker test revealed 99.9   11/24/2018 Tumor Marker   Patient's tumor was tested for the following markers: CA-125 Results of the tumor marker test revealed 81.2   12/14/2018 Tumor Marker   Patient's tumor was tested for the following markers: CA-125 Results of  the tumor marker test revealed 81.2   12/14/2018 Imaging   1. Mild improvement in peritoneal disease. Peritoneal nodule within the left posterior pelvis contiguous with the left side of vaginal cuff is slightly decreased in size in the interval. Within the right iliac fossa there is a small peritoneal nodule which has decreased in size in the interval. Central small bowel mesenteric lymph node is not significantly changed. Slight decrease in size left periaortic lymph node.    2. Morphologic features of the liver compatible with cirrhosis. Splenomegaly is noted which may reflect portal venous hypertension.   12/21/2018 Tumor Marker   Patient's tumor was tested for the following markers: CA-125 Results of the tumor marker test revealed 64.2   12/21/2018 -  Chemotherapy   The patient had taxotere for chemotherapy treatment.     03/02/2019 Tumor Marker   Patient's tumor was tested for the following markers: CA-125. 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   No new or  progressive metastatic disease within the abdomen or pelvis.   Hepatic cirrhosis and findings of portal venous hypertension. 1.1 cm low-attenuation lesion within left hepatic lobe, which could represent a regenerative nodule or small hepatocellular carcinoma. Recommend abdomen MRI without and with contrast for further characterization.     04/13/2019 Tumor Marker   Patient's tumor was tested for the following markers: CA-125 Results of the tumor marker test revealed 36.8   05/26/2019 Tumor Marker   Patient's tumor was tested for the following markers: CA-125 Results of the tumor marker test revealed 31.6.   06/15/2019 Tumor Marker   Patient's tumor was tested for the following markers: CA-125 Results of the tumor marker test revealed 25.4   07/05/2019 Imaging   1. Stable CT examination. Stable peritoneal thickening in the pelvis and paracolic gutters. Stable solid left deep pelvic implant. Stable trace ascites. No new or progressive metastatic disease. 2. Stable cirrhosis. No discrete liver masses. 3. Stable mild-to-moderate splenomegaly. 4. Aortic Atherosclerosis (ICD10-I70.0).   07/27/2019 Tumor Marker   Patient's tumor was tested for the following markers: CA-125 Results of the tumor marker test revealed 35.4   08/17/2019 Tumor Marker   Patient's tumor was tested for the following markers: Ca-125 Results of the tumor marker test revealed 35.7.   09/17/2019 - 09/21/2019 Hospital Admission   She was admitted due to esophageal variceal bleeding presents with hematemesis and melanotic stool with epigastric pain.  GI consulted, started on octreotide infusion.  Underwent EGD which showed minimal bleeding, remain on octreotide drip, PPI drip.  Hemoglobin trended down therefore received PRBC transfusion.  Over several days her bleeding subsided, hemodynamically remained stable.    09/17/2019 Procedure   EGD report - Esophageal mucosal scarring, from prior esophageal banding sessions. - Grade I  esophageal varices. - Red blood in the cardia and in the gastric fundus. - Portal hypertensive gastropathy. - Normal duodenal bulb, first portion of the duodenum and second portion of the duodenum. 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 2. Otherwise stable CT exam. Stable peritoneal thickening within the bilateral deep pelvic peritoneum. Stable left  deep pelvic implant.No evidence for progressed disease. 3. Stable splenomegaly. 4. Morphologic changes to the liver compatible with cirrhosis. Trace perihepatic and pelvic ascites.   02/22/2020 Imaging   1. New lucent lesion in the left side of the L4 vertebral body measuring 1 cm, concerning for possible osseous metastasis. Consideration for further evaluation with bone scan is suggested. Close attention on follow-up imaging is also recommended to ensure stability. 2. Previously noted peritoneal thickening and nodularity in the low anatomic pelvis has regressed slightly. 3. Borderline enlarged and mildly enlarged pelvic and retroperitoneal lymph nodes appear similar to slightly increased compared to the prior examination, as above. Continued attention on follow-up studies is recommended.  4. 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 5. Aortic atherosclerosis. 6. Additional incidental findings, as above.   02/22/2020 Tumor Marker   Patient's tumor was tested for the following markers: CA-125 Results of the tumor marker test revealed 30.1   04/06/2020 Tumor Marker   Patient's tumor was  tested for the following markers: CA-125 Results of the tumor marker test revealed 35.7   09/11/2020 Tumor Marker   Patient's tumor was tested for the following markers: CA-125. Results of the tumor marker test revealed 26.6.   09/12/2020 Imaging   1. Slow enlargement of a currently 2.9 cubic cm soft tissue nodule along the left adnexa. Tumor deposit not excluded. 2. The left external iliac lymph node has substantially reduced in size, currently 0.9 cm in short axis and formerly 1.4 cm. 3. 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 4. Cirrhosis with portal venous hypertension. 5. Mild distal esophageal wall thickening, probably from esophagitis. Splenomegaly. 6. Scarring in the left kidney. 7. Sigmoid colon diverticulosis. 8. Pelvic floor laxity with small cystocele. 9. Lumbar impingement at L2-3, L3-4, and L4-5.     10/23/2020 Tumor Marker   Patient's tumor was tested for the following markers: CA-125. Results of the tumor marker test revealed 35.3.   12/12/2020 Tumor Marker   Patient's tumor was tested for the following markers: CA-125. Results of the tumor marker test revealed 39.1.   12/13/2020 Imaging   1. Continued increase in size of soft tissue nodule within the left posterior pelvis adjacent to the sigmoid colon. Cannot exclude small tumor deposit. 2. Similar appearance of thickening and mild nodularity along the peritoneal reflections of the pelvis and pericolic gutters. 3. Morphologic findings of the liver compatible with cirrhosis. 4. Splenomegaly. 5. Aortic Atherosclerosis (ICD10-I70.0).   01/24/2021 Tumor Marker   Patient's tumor was tested for the following markers: CA-125. Results of the tumor marker test revealed 56.2.   02/09/2021 Imaging   1. Mild interval increased size of the solid left adnexal mass which may represent tumor recurrence/deposit. The  mass abuts the adjacent sigmoid colon as well as the left posterior wall of the urinary bladder, invasion not excluded. 2. Otherwise no new mass or lymphadenopathy identified in the abdomen or pelvis. 3. Hepatic cirrhosis. Splenomegaly consistent with portal hypertension.   04/11/2021 Imaging   1. Left posterior pelvic soft tissue mass, adjacent to the sigmoid colon, has slightly increased in size in comparison to recent prior with significant interval increase in size over more remote priors, findings which are highly suspicious for active neoplastic disease involvement. 2. No new discrete omental or peritoneal nodules/masses identified. 3. Similar nodular thickening of the peritoneum along the bilateral pericolic gutters and in the pelvis without  ascites. 4. No pathologically enlarged abdominal or pelvic lymph nodes and no solid organ metastases visualized. 5. Cirrhotic hepatic morphology with evidence of portal hypertension including splenomegaly. 6. Sigmoid colonic diverticulosis without findings of acute diverticulitis. 7. Moderate volume of formed stool throughout the colon. 8. Aortic Atherosclerosis (ICD10-I70.0).   04/11/2021 Tumor Marker   Patient's tumor was tested for the following markers: CA-125. Results of the tumor marker test revealed 79.4 .   04/23/2021 - 10/09/2021 Chemotherapy   Patient is on Treatment Plan : Ovarian Docetaxel q21d     04/24/2021 Tumor Marker   Patient's tumor was tested for the following markers: CA-125. Results of the tumor marker test revealed 68.5.   05/16/2021 Tumor Marker   Patient's tumor was tested for the following markers: CA-125. Results of the tumor marker test revealed 68.1.   06/28/2021 Tumor Marker   Patient's tumor was tested for the following markers: CA-125. Results of the tumor marker test revealed 52.   07/16/2021 Imaging   1. Stable cirrhotic changes involving the liver. Associated portal venous hypertension, portal venous collaterals  and splenomegaly. No hepatic lesions. 2. Slight interval decrease in size of the left pelvic implant. 3. Stable minimal peritoneal nodularity involving the pericolic gutters bilaterally. No new or progressive findings.     08/09/2021 Tumor Marker   Patient's tumor was tested for the following markers: CA-125. Results of the tumor marker test revealed 44.1.   09/20/2021 Tumor Marker   Patient's tumor was tested for the following markers: CA-125. Results of the tumor marker test revealed 38.2.   10/31/2021 Imaging   1. Stable exam. No new or progressive interval findings. 2. Stable appearance of the soft tissue nodule in the left pelvis. 3. Subtle omental nodule is similar to prior. The minimal peritoneal nodularity described previously in the para colic gutters is similar to prior. 4. Cirrhosis with splenomegaly. 5. Cholelithiasis. 6. Left colonic diverticulosis without diverticulitis. 7. Aortic Atherosclerosis (ICD10-I70.0).   10/31/2021 Tumor Marker   Patient's tumor was tested for the following markers: Ca-125. Results of the tumor marker test revealed 37.7.   11/01/2021 - 11/01/2021 Chemotherapy   Patient is on Treatment Plan : BREAST Docetaxel (77) q21d     11/18/2021 -  Chemotherapy   She started on Niraparib     PHYSICAL EXAMINATION: ECOG PERFORMANCE STATUS: 1 - Symptomatic but completely ambulatory  Vitals:   11/29/21 0853  BP: (!) 150/75  Pulse: 70  Resp: 18  Temp: (!) 97.4 F (36.3 C)  SpO2: 99%   Filed Weights   11/29/21 0853  Weight: 176 lb 3.2 oz (79.9 kg)    GENERAL:alert, no distress and comfortable NEURO: alert & oriented x 3 with fluent speech, no focal motor/sensory deficits  LABORATORY DATA:  I have reviewed the data as listed    Component Value Date/Time   NA 141 11/29/2021 0821   NA 142 02/06/2017 0742   K 4.1 11/29/2021 0821   K 3.9 02/06/2017 0742   CL 106 11/29/2021 0821   CO2 29 11/29/2021 0821   CO2 22 02/06/2017 0742   GLUCOSE 116  (H) 11/29/2021 0821   GLUCOSE 156 (H) 02/06/2017 0742   BUN 19 11/29/2021 0821   BUN 13.5 02/06/2017 0742   CREATININE 1.14 (H) 11/29/2021 0821   CREATININE 0.87 10/30/2021 0732   CREATININE 0.8 02/06/2017 0742   CALCIUM 9.1 11/29/2021 0821   CALCIUM 8.5 02/06/2017 0742   PROT 7.5 11/29/2021 0821   PROT 6.9 02/06/2017 0742   ALBUMIN 4.2  11/29/2021 0821   ALBUMIN 3.7 02/06/2017 0742   AST 19 11/29/2021 0821   AST 19 10/30/2021 0732   AST 47 (H) 02/06/2017 0742   ALT 17 11/29/2021 0821   ALT 17 10/30/2021 0732   ALT 54 02/06/2017 0742   ALKPHOS 98 11/29/2021 0821   ALKPHOS 130 02/06/2017 0742   BILITOT 0.7 11/29/2021 0821   BILITOT 0.6 10/30/2021 0732   BILITOT 0.65 02/06/2017 0742   GFRNONAA 51 (L) 11/29/2021 0821   GFRNONAA >60 10/30/2021 0732   GFRAA >60 11/09/2019 0953   GFRAA >60 10/26/2019 0722    No results found for: "SPEP", "UPEP"  Lab Results  Component Value Date   WBC 3.3 (L) 11/29/2021   NEUTROABS 2.1 11/29/2021   HGB 13.1 11/29/2021   HCT 40.2 11/29/2021   MCV 94.4 11/29/2021   PLT 79 (L) 11/29/2021      Chemistry      Component Value Date/Time   NA 141 11/29/2021 0821   NA 142 02/06/2017 0742   K 4.1 11/29/2021 0821   K 3.9 02/06/2017 0742   CL 106 11/29/2021 0821   CO2 29 11/29/2021 0821   CO2 22 02/06/2017 0742   BUN 19 11/29/2021 0821   BUN 13.5 02/06/2017 0742   CREATININE 1.14 (H) 11/29/2021 0821   CREATININE 0.87 10/30/2021 0732   CREATININE 0.8 02/06/2017 0742      Component Value Date/Time   CALCIUM 9.1 11/29/2021 0821   CALCIUM 8.5 02/06/2017 0742   ALKPHOS 98 11/29/2021 0821   ALKPHOS 130 02/06/2017 0742   AST 19 11/29/2021 0821   AST 19 10/30/2021 0732   AST 47 (H) 02/06/2017 0742   ALT 17 11/29/2021 0821   ALT 17 10/30/2021 0732   ALT 54 02/06/2017 0742   BILITOT 0.7 11/29/2021 0821   BILITOT 0.6 10/30/2021 0732   BILITOT 0.65 02/06/2017 1610

## 2021-11-29 NOTE — Assessment & Plan Note (Signed)
The patient started niraparib recently, complicated by worsening pancytopenia I plan to reduce niraparib to 100 mg daily and see her back next week for further follow-up

## 2021-11-30 ENCOUNTER — Other Ambulatory Visit (HOSPITAL_COMMUNITY): Payer: Self-pay

## 2021-12-01 LAB — CA 125: Cancer Antigen (CA) 125: 45.5 U/mL — ABNORMAL HIGH (ref 0.0–38.1)

## 2021-12-06 ENCOUNTER — Inpatient Hospital Stay: Payer: Medicare Other

## 2021-12-06 ENCOUNTER — Encounter: Payer: Self-pay | Admitting: Hematology and Oncology

## 2021-12-06 ENCOUNTER — Inpatient Hospital Stay (HOSPITAL_BASED_OUTPATIENT_CLINIC_OR_DEPARTMENT_OTHER): Payer: Medicare Other | Admitting: Hematology and Oncology

## 2021-12-06 VITALS — BP 135/74 | HR 70 | Temp 97.7°F | Resp 18 | Ht 62.0 in | Wt 176.8 lb

## 2021-12-06 DIAGNOSIS — D61818 Other pancytopenia: Secondary | ICD-10-CM

## 2021-12-06 DIAGNOSIS — C482 Malignant neoplasm of peritoneum, unspecified: Secondary | ICD-10-CM

## 2021-12-06 LAB — CBC WITH DIFFERENTIAL/PLATELET
Abs Immature Granulocytes: 0.01 10*3/uL (ref 0.00–0.07)
Basophils Absolute: 0 10*3/uL (ref 0.0–0.1)
Basophils Relative: 0 %
Eosinophils Absolute: 0.1 10*3/uL (ref 0.0–0.5)
Eosinophils Relative: 2 %
HCT: 37.9 % (ref 36.0–46.0)
Hemoglobin: 12.5 g/dL (ref 12.0–15.0)
Immature Granulocytes: 0 %
Lymphocytes Relative: 21 %
Lymphs Abs: 0.7 10*3/uL (ref 0.7–4.0)
MCH: 31.4 pg (ref 26.0–34.0)
MCHC: 33 g/dL (ref 30.0–36.0)
MCV: 95.2 fL (ref 80.0–100.0)
Monocytes Absolute: 0.2 10*3/uL (ref 0.1–1.0)
Monocytes Relative: 5 %
Neutro Abs: 2.4 10*3/uL (ref 1.7–7.7)
Neutrophils Relative %: 72 %
Platelets: 77 10*3/uL — ABNORMAL LOW (ref 150–400)
RBC: 3.98 MIL/uL (ref 3.87–5.11)
RDW: 14.3 % (ref 11.5–15.5)
WBC: 3.4 10*3/uL — ABNORMAL LOW (ref 4.0–10.5)
nRBC: 0 % (ref 0.0–0.2)

## 2021-12-06 LAB — COMPREHENSIVE METABOLIC PANEL
ALT: 17 U/L (ref 0–44)
AST: 20 U/L (ref 15–41)
Albumin: 4 g/dL (ref 3.5–5.0)
Alkaline Phosphatase: 107 U/L (ref 38–126)
Anion gap: 6 (ref 5–15)
BUN: 18 mg/dL (ref 8–23)
CO2: 28 mmol/L (ref 22–32)
Calcium: 8.9 mg/dL (ref 8.9–10.3)
Chloride: 106 mmol/L (ref 98–111)
Creatinine, Ser: 1.03 mg/dL — ABNORMAL HIGH (ref 0.44–1.00)
GFR, Estimated: 57 mL/min — ABNORMAL LOW (ref >60)
Glucose, Bld: 130 mg/dL — ABNORMAL HIGH (ref 70–99)
Potassium: 4.3 mmol/L (ref 3.5–5.1)
Sodium: 140 mmol/L (ref 135–145)
Total Bilirubin: 0.6 mg/dL (ref 0.3–1.2)
Total Protein: 7.2 g/dL (ref 6.5–8.1)

## 2021-12-06 NOTE — Assessment & Plan Note (Signed)
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

## 2021-12-06 NOTE — Assessment & Plan Note (Signed)
The patient started niraparib recently, complicated by worsening pancytopenia Despite recent change in the dose on niraparib, she has persistent pancytopenia We discussed the risk and benefits of close monitoring and follow-up and she is in agreement She will return again next week for further follow-up and she will continue on 100 mg of niraparib for now

## 2021-12-06 NOTE — Progress Notes (Signed)
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[facility]) The patient started niraparib recently, complicated by worsening pancytopenia Despite recent change in the dose on niraparib, she has persistent pancytopenia We discussed the risk and benefits of close monitoring and follow-up and she is in agreement She will return again next week for further follow-up and she will continue on 100 mg of niraparib for now  Pancytopenia, acquired ([place]) 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   All questions were answered. The patient knows to call the clinic with any problems, questions or concerns. The total time spent in the appointment was 20 minutes encounter with patients including review of chart and various tests results, discussions about plan of care and coordination of care plan   [name], MD 12/06/2021 10:50 AM  INTERVAL HISTORY: Please see below for problem oriented charting. she returns for treatment follow-up on niraparib treatment She is doing well She have no signs or symptoms of nausea No recent infection, fever or chills No recent bleeding  REVIEW OF SYSTEMS:   Constitutional: Denies fevers, chills or abnormal weight loss Eyes: Denies blurriness of vision Ears, nose, mouth, throat, and face: Denies mucositis or sore throat Respiratory: Denies cough, dyspnea or wheezes Cardiovascular: Denies palpitation, chest discomfort or lower extremity swelling Gastrointestinal:  Denies nausea, heartburn or change in bowel habits Skin: Denies abnormal skin  rashes Lymphatics: Denies new lymphadenopathy or easy bruising Neurological:Denies numbness, tingling or new weaknesses Behavioral/Psych: Mood is stable, no new changes  All other systems were reviewed with the patient and are negative.  I have reviewed the past medical history, past surgical history, social history and family history with the patient and they are unchanged from previous note.  ALLERGIES:  is allergic to carboplatin, shellfish allergy, vancomycin, benadryl [diphenhydramine hcl], penicillins, and tape.  MEDICATIONS:  Current Outpatient Medications  Medication Sig Dispense Refill   calcium elemental as carbonate (BARIATRIC TUMS ULTRA) 400 MG chewable tablet Chew 1 tablet by mouth 2 (two) times daily.     Cholecalciferol (VITAMIN D) 50 MCG (2000 UT) tablet Take 2,000 Units by mouth at bedtime.     gabapentin (NEURONTIN) 300 MG capsule Take 3 capsules (900 mg total) by mouth 2 (two) times daily. 120 capsule 11   HYDROcodone-acetaminophen (NORCO/VICODIN) 5-325 MG tablet Take 1 tablet by mouth every 6 (six) hours as needed for moderate pain. 60 tablet 0   insulin degludec (TRESIBA FLEXTOUCH) 100 UNIT/ML FlexTouch Pen Inject 28 Units into the skin daily.     lidocaine-prilocaine (EMLA) cream Apply to Porta-cath  1-2 hours prior to access as directed. (Patient taking differently: Apply 1 application  topically daily as needed (port access).) 30 g 9   niraparib tosylate (ZEJULA) 100 MG tablet Take 1 tablet (100 mg total) by mouth daily. 30 tablet 9   ondansetron (ZOFRAN) 8 MG tablet TAKE 1 TABLET(8 MG) BY MOUTH EVERY 8 HOURS AS NEEDED 60 tablet 1   OZEMPIC, 0.25 OR 0.5 MG/DOSE, 2 MG/1.5ML SOPN Inject 0.25 mg into the skin every Saturday.     pantoprazole (PROTONIX) 40 MG tablet Take  1 tablet (40 mg total) by mouth daily.     rotigotine (NEUPRO) 4 MG/24HR APPLY 1 PATCH TOPICALLY TO THE SKIN DAILY 90 patch 0   No current facility-administered medications for this visit.    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 Had allergic reaction to carboplatin. Avastin was stopped due to GI hemorrhage   Primary peritoneal carcinomatosis ([place])  09/06/2011 Imaging   CT findings consistent with cirrhotic changes involving the liver.  No worrisome liver mass.  There are associated portal venous collaterals and splenomegaly consistent with portal venous hypertension. 2.  No other significant upper abdominal findings   11/08/2013 Imaging   US showed ascites   11/08/2013 Initial Diagnosis   Patient had ~ 2 months of some urinary incontinence, saw Dr Ronita Hipps in 10-2013, with pelvic mass on exam    11/12/2013 Imaging   There are findings of progressive hepatic cirrhosis with a large amount of ascites and mild splenomegaly. There are venous collaterals present. 2. There is abnormal thickening of the peritoneal surface along the left mid and lower abdominal wall. There is abnormal soft tissue density in the pelvis as well which could reflect the clinically suspected ovarian malignancy but the findings are nondiagnostic. 3. There is a new small left pleural effusion. 4. There is no acute bowel abnormality.   11/18/2013 Imaging   Successful ultrasound guided diagnostic and therapeutic paracentesis yielding 4.1 liters of ascites.     11/18/2013 Pathology Results   PERITONEAL/ASCITIC FLUID (SPECIMEN 1 OF 1 COLLECTED 11/18/13): SEROUS CARCINOMA, PLEASE SEE COMMENT.   12/01/2013 Tumor Marker   Patient's tumor was tested for the following markers: CA125 Results of the tumor marker test revealed 1938   12/03/2013 Imaging   Successful ultrasound-guided therapeutic paracentesis yielding 3.6 liters of peritoneal fluid.   12/07/2013 - 05/30/2014 Chemotherapy   She received 3 cycles of carboplatin and Taxol, interrupted cycle 4 for surgery.  She subsequently completed 3 more cycles of chemotherapy  after surgery   12/20/2013 Imaging   Successful ultrasound-guided diagnostic and therapeutic paracentesis yielding 2.2 liters of peritoneal fluid   12/20/2013 Pathology Results   PERITONEAL/ASCITIC FLUID(SPECIMEN 1 OF 1 COLLECTED 12/20/13): MALIGNANT CELLS CONSISTENT WITH SEROUS CARCINOMA   01/13/2014 Tumor Marker   Patient's tumor was tested for the following markers: CA125 Results of the tumor marker test revealed 227   02/07/2014 Tumor Marker   Patient's tumor was tested for the following markers: CA125 Results of the tumor marker test revealed 130   02/15/2014 Genetic Testing   Genetics testing from 02-2014 normal ([employer] panel)   02/23/2014 Imaging   Interval decrease in ascites. 2. Morphologic changes in the liver consistent with cirrhosis. 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 Portal vein remains patent at this time. 3. Persistent but decreased abnormal soft tissue attenuation tracking in the omental fat. This may be secondary to interval improvement in metastatic disease. 4. Peritoneal thickening seen in the left abdomen and pelvis on the previous study has decreased and nearly resolved in the interval. This also suggests interval improvement in metastatic disease.     03/07/2014 Procedure   Ultrasound and fluoroscopically guided right internal jugular single lumen power port catheter insertion. Tip in the SVC/RA junction. Catheter ready for use.   03/17/2014 Tumor Marker   Patient's tumor was tested for the following markers: CA125 Results of the tumor marker test revealed 45   03/29/2014 Pathology Results  1. Omentum, resection for tumor - HIGH GRADE SEROUS CARCINOMA, SEE COMMENT. 2. Ovary and fallopian tube, right - HIGH GRADE SEROUS CARCINOMA, SEE COMMENT. 3. Ovary and fallopian tube, left - HIGH GRADE SEROUS CARCINOMA, SEE COMMENT. Diagnosis Note 1. Nests and clusters of malignant cells are invading the omental tissue (part #1) with  associated fibrosis. The cells are pleomorphic with prominent nucleoli. There are scattered psammoma bodies. The ovaries are atrophic and exhibit multiple foci of surface based invasive carcinoma and associated fibrosis. The fallopian tubes have a few foci of carcinoma, also superficially located. There are no precursor lesions noted in the ovaries or fallopian tubes (the entire tubes and ovaries were submitted for evaluation). While there is some retraction artifact, there are several foci suspicious for lymphovascular invasion. 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 Given the fibrosis, there does appear to be a small amount of treatment effect, however, there is abundant residual tumor.    03/29/2014 Surgery   Procedure(s) Performed: 1. Exploratory laparotomy with bilateral salpingo-oophorectomy, omentectomy radical tumor debulking for ovarian cancer .   Surgeon: Thereasa Solo, MD.   Assistant Surgeon: Lahoma Crocker, M.D. Assistant: (an MD assistant was necessary for tissue manipulation, retraction and positioning due to the complexity of the case and hospital policies).  Operative Findings: 10cm omental cake from hepatic to splenic flexure, densely adherent to transverse colon. Milliary studding of tumor implants (<56m, too numerous in number to count) adherent to the mesentery of the small bowel and small bowel wall. Small volume (100cc) ascites. Small ovaries bilaterally, densely adherent to the pelvic cul de sac. Left ovary and tube densely adherent to sigmoid colon. Cirrhotic liver with hepatomegaly and splenomegaly.     This represented an optimal cytoreduction (R1) with visible disease residual on the bowel wall and mesentery (millial, <349mimplants).      04/18/2014 Tumor Marker   Patient's tumor was tested for the following markers: CA125 Results of  the tumor marker test revealed 122   05/16/2014 Tumor Marker   Patient's tumor was tested for the following markers: CA125 Results of the tumor marker test revealed 30   06/10/2014 Tumor Marker   Patient's tumor was tested for the following markers: CA125 Results of the tumor marker test revealed 19   07/04/2014 Imaging   Interval improvement in the appearance of peritoneal metastasis secondary to ovarian cancer. 2. 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     08/18/2014 Tumor Marker   Patient's tumor was tested for the following markers: CA125 Results of the tumor marker test revealed 16   10/13/2014 Tumor Marker   Patient's tumor was tested for the following markers: CA125 Results of the tumor marker test revealed 14   11/21/2014 Tumor Marker   Patient's tumor was tested for the following markers: CA125 Results of the tumor marker test revealed 16   12/28/2014 Tumor Marker   Patient's tumor was tested for the following markers: CA125 Results of the tumor marker test revealed 762   02/27/2015 Tumor Marker   Patient's tumor was tested for the following markers: CA125 Results of the tumor marker test revealed 20.2   03/22/2015 Imaging   Filler appearance to the prior exam, with very subtle fluid tracking along portions of the liver, and very minimal nodularity along the right paracolic gutter representing residua from the prior peritoneal cancer. The current abnormalities  could simply be post therapy findings rather than necessarily representing residual malignancy. No new or enlarging lesions are identified. 2. Hepatic cirrhosis and splenomegaly. There is some gastric varices suggesting portal venous hypertension. 3. Left foraminal stenosis at L4-5 due to spurring. There is likely also some central narrowing of the thecal sac at this level. 4. Bibasilar scarring. 5. Chronic mild left mid kidney scarring   03/27/2015 Tumor Marker   Patient's tumor was tested for the  following markers: CA125 Results of the tumor marker test revealed 25.3   04/24/2015 Imaging   Disc bulge L2-3 with mild spinal stenosis and right foraminal encroachment. 2. Disc bulge L3-4 with mild spinal stenosis. 3. Multifactorial spinal, left lateral recess and foraminal stenosis L4-5. There is grade 1 anterolisthesis without evident dynamic instability.   05/29/2015 Tumor Marker   Patient's tumor was tested for the following markers: CA125 Results of the tumor marker test revealed 29.9   08/21/2015 Tumor Marker   Patient's tumor was tested for the following markers: CA125 Results of the tumor marker test revealed 45   09/18/2015 Tumor Marker   Patient's tumor was tested for the following markers: CA125 Results of the tumor marker test revealed 47   09/27/2015 Imaging   New small bowel mesenteric nodule and enlarged left external iliac lymph node, highly worrisome for metastatic disease. 2. Cirrhosis and splenomegaly   10/10/2015 Tumor Marker   Patient's tumor was tested for the following markers: CA125 Results of the tumor marker test revealed 53.4   10/10/2015 - 12/11/2015 Chemotherapy   She received 4 cycles of carboplatin only   11/20/2015 Tumor Marker   Patient's tumor was tested for the following markers: CA125 Results of the tumor marker test revealed 41.3   12/20/2015 Imaging   CT: Mild left external iliac lymphadenopathy is slightly decreased. Mildly enlarged lower mesenteric nodule is slightly decreased. 2. No new or progressive metastatic disease in the abdomen or pelvis. 3. Cirrhosis. Stable subcentimeter hypodense left liver lobe lesion. No new liver lesions. 4. Stable mild splenomegaly.  No ascites. 5. Mild sigmoid diverticulosis.   01/01/2016 -  Anti-estrogen oral therapy   She was placed on tamoxifen. Had letrozole initially but switched to tamoxifen due to poor tolerance   01/15/2016 Tumor Marker   Patient's tumor was tested for the following markers: CA125 Results  of the tumor marker test revealed 31.4   02/19/2016 Tumor Marker   Patient's tumor was tested for the following markers: CA125 Results of the tumor marker test revealed 36.5   05/13/2016 Imaging   No evidence of disease progression within the abdomen or pelvis. Previously noted small central mesenteric nodule and left external iliac node appear slightly smaller. 2. Stable changes of hepatic cirrhosis and portal hypertension with associated splenomegaly. No new or enlarging hepatic lesions are identified. 3. No acute findings.   05/13/2016 Tumor Marker   Patient's tumor was tested for the following markers: CA125 Results of the tumor marker test revealed 41.2   06/24/2016 Tumor Marker   Patient's tumor was tested for the following markers: CA125 Results of the tumor marker test revealed 47.3   08/20/2016 Imaging   1. The left external iliac lymph node is slightly increased in size compared to the prior exam, currently 1.1 cm and previously 0.9 cm in diameter. 2. Stable central mesenteric lymph node at 0.9 cm in diameter. 3. Stable trace free pelvic fluid and stable slight thickening along the right paracolic gutter, without well-defined peritoneal nodularity. 4. Other imaging findings of  potential clinical significance: Subsegmental atelectasis or scarring in the lung bases. Hepatic cirrhosis. Left renal scarring. Mild splenomegaly. Sigmoid colon diverticulosis. Impingement at L4-5 due to spondylosis and degenerative disc disease broad Schmorl' s nodes at L3-L4. Pelvic floor laxity   08/23/2016 Tumor Marker   Patient's tumor was tested for the following markers: CA125 Results of the tumor marker test revealed 80.2   09/04/2016 Imaging   LV EF: 60% -   65%   09/12/2016 Tumor Marker   Patient's tumor was tested for the following markers: CA125 Results of the tumor marker test revealed 98.5   09/12/2016 - 01/29/2018 Chemotherapy   The patient received Doxil and Avastin. Avastin was discontinued in  Dec 2019 due to GI hemorrhage   09/26/2016 Tumor Marker   Patient's tumor was tested for the following markers: CA125 Results of the tumor marker test revealed 68.9   10/10/2016 Tumor Marker   Patient's tumor was tested for the following markers: CA125 Results of the tumor marker test revealed 65.6   11/07/2016 Tumor Marker   Patient's tumor was tested for the following markers: CA125 Results of the tumor marker test revealed 46.4   11/29/2016 Imaging   Stable mild peritoneal thickening, suspicious for peritoneal carcinomatosis. No new or progressive disease identified. No evidence of ascites.  No significant change in 10 mm left external iliac and 8 mm mesenteric lymph nodes.  Stable hepatic cirrhosis, and splenomegaly consistent with portal venous hypertension.  Colonic diverticulosis, without radiographic evidence of diverticulitis.   12/02/2016 Imaging   Normal LV size with EF 55%. Basal inferior and basal inferoseptal hypokinesis. Normal RV size and systolic function. No significant valvular abnormalities.   12/05/2016 Tumor Marker   Patient's tumor was tested for the following markers: CA125 Results of the tumor marker test revealed 49.1   01/09/2017 Tumor Marker   Patient's tumor was tested for the following markers: CA125 Results of the tumor marker test revealed 47.2   02/06/2017 Tumor Marker   Patient's tumor was tested for the following markers: CA125 Results of the tumor marker test revealed 44.1   02/18/2017 Imaging   Stable mild peritoneal thickening. No new or progressive disease identified within the abdomen or pelvis.  Stable tiny sub-cm left external iliac and mesenteric lymph nodes.  Stable hepatic cirrhosis, and splenomegaly consistent with portal venous hypertension. No evidence of hepatic neoplasm.  Colonic diverticulosis, without radiographic evidence of diverticulitis.   03/06/2017 Tumor Marker   Patient's tumor was tested for the following markers:  CA125 Results of the tumor marker test revealed 50.4   03/17/2017 Imaging   - Left ventricle: The cavity size was normal. Wall thickness was normal. Systolic function was normal. The estimated ejection fraction was in the range of 55% to 60%. Wall motion was normal; there were no regional wall motion abnormalities. Features are consistent with a pseudonormal left ventricular filling pattern, with concomitant abnormal relaxation and increased filling pressure (grade 2 diastolic dysfunction). - Mitral valve: There was mild regurgitation. - Left atrium: The atrium was mildly dilated   04/03/2017 Tumor Marker   Patient's tumor was tested for the following markers: CA125 Results of the tumor marker test revealed 47.2   05/14/2017 Imaging   CT scan of abdomen and pelvis Stable mild peritoneal thickening. No new or progressive disease within the abdomen or pelvis.  Stable hepatic cirrhosis. Stable splenomegaly, consistent with portal venous hypertension. No evidence of hepatic neoplasm.   05/15/2017 Tumor Marker   Patient's tumor was tested for the following  markers: CA125 Results of the tumor marker test revealed 56.1   06/12/2017 Tumor Marker   Patient's tumor was tested for the following markers: CA125 Results of the tumor marker test revealed 49.2   07/10/2017 Tumor Marker   Patient's tumor was tested for the following markers: CA125 Results of the tumor marker test revealed 46.3   08/07/2017 Tumor Marker   Patient's tumor was tested for the following markers: CA125 Results of the tumor marker test revealed 52.9   08/20/2017 Imaging   1. Mild omental/peritoneal haziness, unchanged. No evidence of new metastatic disease. 2. Cirrhosis with splenomegaly.   09/04/2017 Tumor Marker   Patient's tumor was tested for the following markers: CA125 Results of the tumor marker test revealed 48.5   11/13/2017 Tumor Marker   Patient's tumor was tested for the following markers: CA125 Results of the  tumor marker test revealed 55.8   12/17/2017 Imaging   1. No findings to suggest metastatic disease in the abdomen or pelvis. 2. Severe hepatic cirrhosis with evidence of portal hypertension, as demonstrated by dilated portal vein, splenomegaly and portosystemic collateral pathways, including gastric and esophageal varices.  3. Colonic diverticulosis without evidence of acute diverticulitis at this time. 4. Additional incidental findings, as above.   01/16/2018 Tumor Marker   Patient's tumor was tested for the following markers: CA125 Results of the tumor marker test revealed 63.7   02/10/2018 Procedure   EGD - Recently bleeding grade III and large (> 5 mm) esophageal varices. Completely eradicated. Banded. - Portal hypertensive gastropathy. - Normal examined duodenum. - No specimens collected   02/13/2018 Tumor Marker   Patient's tumor was tested for the following markers: CA125 Results of the tumor marker test revealed 82.7   02/25/2018 Imaging   Bone density showed mild osteopenia   04/08/2018 Tumor Marker   Patient's tumor was tested for the following markers: CA125 Results of the tumor marker test revealed 82.7   04/08/2018 Imaging   1. No definite omental or peritoneal surface lesions. However, there are 2 slowly enlarging lymph nodes noted. One is in the small bowel mesentery and the other is in the left deep pelvis. Could not exclude recurrent disease. 2. Stable advanced cirrhotic changes involving the liver with portal venous hypertension, portal venous collaterals, esophageal varices and splenomegaly. No worrisome hepatic lesions. 3. Diffuse wall thickening of the colon could suggest diffuse colitis or could be due to low albumin. Recommend correlation with any symptoms such as diarrhea.   05/21/2018 Tumor Marker   Patient's tumor was tested for the following markers: CA125 Results of the tumor marker test revealed 86   09/07/2018 Tumor Marker   Patient's tumor was tested  for the following markers:CA-125 Results of the tumor marker test revealed 111   09/29/2018 Tumor Marker   Patient's tumor was tested for the following markers: CA-125 Results of the tumor marker test revealed 121.   09/29/2018 - 11/24/2018 Chemotherapy   The patient had carboplatin and gemzar for chemotherapy treatment.  Chemo was stopped due to allergic reaction to carboplatin   10/27/2018 Tumor Marker   Patient's tumor was tested for the following markers: CA-125 Results of the tumor marker test revealed 99.9   11/24/2018 Tumor Marker   Patient's tumor was tested for the following markers: CA-125 Results of the tumor marker test revealed 81.2   12/14/2018 Tumor Marker   Patient's tumor was tested for the following markers: CA-125 Results of the tumor marker test revealed 81.2   12/14/2018 Imaging  1. Mild improvement in peritoneal disease. Peritoneal nodule within the left posterior pelvis contiguous with the left side of vaginal cuff is slightly decreased in size in the interval. Within the right iliac fossa there is a small peritoneal nodule which has decreased in size in the interval. Central small bowel mesenteric lymph node is not significantly changed. Slight decrease in size left periaortic lymph node.    2. Morphologic features of the liver compatible with cirrhosis. Splenomegaly is noted which may reflect portal venous hypertension.   12/21/2018 Tumor Marker   Patient's tumor was tested for the following markers: CA-125 Results of the tumor marker test revealed 64.2   12/21/2018 -  Chemotherapy   The patient had taxotere for chemotherapy treatment.     03/02/2019 Tumor Marker   Patient's tumor was tested for the following markers: CA-125. 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   No new or progressive metastatic disease within the abdomen or pelvis.   Hepatic  cirrhosis and findings of portal venous hypertension. 1.1 cm low-attenuation lesion within left hepatic lobe, which could represent a regenerative nodule or small hepatocellular carcinoma. Recommend abdomen MRI without and with contrast for further characterization.     04/13/2019 Tumor Marker   Patient's tumor was tested for the following markers: CA-125 Results of the tumor marker test revealed 36.8   05/26/2019 Tumor Marker   Patient's tumor was tested for the following markers: CA-125 Results of the tumor marker test revealed 31.6.   06/15/2019 Tumor Marker   Patient's tumor was tested for the following markers: CA-125 Results of the tumor marker test revealed 25.4   07/05/2019 Imaging   1. Stable CT examination. Stable peritoneal thickening in the pelvis and paracolic gutters. Stable solid left deep pelvic implant. Stable trace ascites. No new or progressive metastatic disease. 2. Stable cirrhosis. No discrete liver masses. 3. Stable mild-to-moderate splenomegaly. 4. Aortic Atherosclerosis (ICD10-I70.0).   07/27/2019 Tumor Marker   Patient's tumor was tested for the following markers: CA-125 Results of the tumor marker test revealed 35.4   08/17/2019 Tumor Marker   Patient's tumor was tested for the following markers: Ca-125 Results of the tumor marker test revealed 35.7.   09/17/2019 - 09/21/2019 Hospital Admission   She was admitted due to esophageal variceal bleeding presents with hematemesis and melanotic stool with epigastric pain.  GI consulted, started on octreotide infusion.  Underwent EGD which showed minimal bleeding, remain on octreotide drip, PPI drip.  Hemoglobin trended down therefore received PRBC transfusion.  Over several days her bleeding subsided, hemodynamically remained stable.    09/17/2019 Procedure   EGD report - Esophageal mucosal scarring, from prior esophageal banding sessions. - Grade I esophageal varices. - Red blood in the cardia and in the gastric fundus. -  Portal hypertensive gastropathy. - Normal duodenal bulb, first portion of the duodenum and second portion of the duodenum. 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 2. Otherwise stable CT exam. Stable peritoneal thickening within the bilateral deep pelvic peritoneum. Stable left deep pelvic implant.No evidence for progressed disease. 3. Stable splenomegaly. 4. Morphologic  changes to the liver compatible with cirrhosis. Trace perihepatic and pelvic ascites.   02/22/2020 Imaging   1. New lucent lesion in the left side of the L4 vertebral body measuring 1 cm, concerning for possible osseous metastasis. Consideration for further evaluation with bone scan is suggested. Close attention on follow-up imaging is also recommended to ensure stability. 2. Previously noted peritoneal thickening and nodularity in the low anatomic pelvis has regressed slightly. 3. Borderline enlarged and mildly enlarged pelvic and retroperitoneal lymph nodes appear similar to slightly increased compared to the prior examination, as above. Continued attention on follow-up studies is recommended.  4. 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 5. Aortic atherosclerosis. 6. Additional incidental findings, as above.   02/22/2020 Tumor Marker   Patient's tumor was tested for the following markers: CA-125 Results of the tumor marker test revealed 30.1   04/06/2020 Tumor Marker   Patient's tumor was tested for the following markers: CA-125 Results of the tumor marker test  revealed 35.7   09/11/2020 Tumor Marker   Patient's tumor was tested for the following markers: CA-125. Results of the tumor marker test revealed 26.6.   09/12/2020 Imaging   1. Slow enlargement of a currently 2.9 cubic cm soft tissue nodule along the left adnexa. Tumor deposit not excluded. 2. The left external iliac lymph node has substantially reduced in size, currently 0.9 cm in short axis and formerly 1.4 cm. 3. 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 4. Cirrhosis with portal venous hypertension. 5. Mild distal esophageal wall thickening, probably from esophagitis. Splenomegaly. 6. Scarring in the left kidney. 7. Sigmoid colon diverticulosis. 8. Pelvic floor laxity with small cystocele. 9. Lumbar impingement at L2-3, L3-4, and L4-5.     10/23/2020 Tumor Marker   Patient's tumor was tested for the following markers: CA-125. Results of the tumor marker test revealed 35.3.   12/12/2020 Tumor Marker   Patient's tumor was tested for the following markers: CA-125. Results of the tumor marker test revealed 39.1.   12/13/2020 Imaging   1. Continued increase in size of soft tissue nodule within the left posterior pelvis adjacent to the sigmoid colon. Cannot exclude small tumor deposit. 2. Similar appearance of thickening and mild nodularity along the peritoneal reflections of the pelvis and pericolic gutters. 3. Morphologic findings of the liver compatible with cirrhosis. 4. Splenomegaly. 5. Aortic Atherosclerosis (ICD10-I70.0).   01/24/2021 Tumor Marker   Patient's tumor was tested for the following markers: CA-125. Results of the tumor marker test revealed 56.2.   02/09/2021 Imaging   1. Mild interval increased size of the solid left adnexal mass which may represent tumor recurrence/deposit. The mass abuts the adjacent sigmoid colon as well as the left posterior wall  of the urinary bladder, invasion not excluded. 2. Otherwise no new mass or lymphadenopathy identified in the abdomen or pelvis. 3. Hepatic cirrhosis. Splenomegaly consistent with portal hypertension.   04/11/2021 Imaging   1. Left posterior pelvic soft tissue mass, adjacent to the sigmoid colon, has slightly increased in size in comparison to recent prior with significant interval increase in size over more remote priors, findings which are highly suspicious for active neoplastic disease involvement. 2. No new discrete omental or peritoneal nodules/masses identified. 3. Similar nodular thickening of the peritoneum along the bilateral pericolic gutters and in the pelvis without ascites. 4. No pathologically enlarged abdominal or pelvic lymph nodes and no  solid organ metastases visualized. 5. Cirrhotic hepatic morphology with evidence of portal hypertension including splenomegaly. 6. Sigmoid colonic diverticulosis without findings of acute diverticulitis. 7. Moderate volume of formed stool throughout the colon. 8. Aortic Atherosclerosis (ICD10-I70.0).   04/11/2021 Tumor Marker   Patient's tumor was tested for the following markers: CA-125. Results of the tumor marker test revealed 79.4 .   04/23/2021 - 10/09/2021 Chemotherapy   Patient is on Treatment Plan : Ovarian Docetaxel q21d     04/24/2021 Tumor Marker   Patient's tumor was tested for the following markers: CA-125. Results of the tumor marker test revealed 68.5.   05/16/2021 Tumor Marker   Patient's tumor was tested for the following markers: CA-125. Results of the tumor marker test revealed 68.1.   06/28/2021 Tumor Marker   Patient's tumor was tested for the following markers: CA-125. Results of the tumor marker test revealed 52.   07/16/2021 Imaging   1. Stable cirrhotic changes involving the liver. Associated portal venous hypertension, portal venous collaterals and splenomegaly. No hepatic lesions. 2. Slight interval decrease in size  of the left pelvic implant. 3. Stable minimal peritoneal nodularity involving the pericolic gutters bilaterally. No new or progressive findings.     08/09/2021 Tumor Marker   Patient's tumor was tested for the following markers: CA-125. Results of the tumor marker test revealed 44.1.   09/20/2021 Tumor Marker   Patient's tumor was tested for the following markers: CA-125. Results of the tumor marker test revealed 38.2.   10/31/2021 Imaging   1. Stable exam. No new or progressive interval findings. 2. Stable appearance of the soft tissue nodule in the left pelvis. 3. Subtle omental nodule is similar to prior. The minimal peritoneal nodularity described previously in the para colic gutters is similar to prior. 4. Cirrhosis with splenomegaly. 5. Cholelithiasis. 6. Left colonic diverticulosis without diverticulitis. 7. Aortic Atherosclerosis (ICD10-I70.0).   10/31/2021 Tumor Marker   Patient's tumor was tested for the following markers: Ca-125. Results of the tumor marker test revealed 37.7.   11/01/2021 - 11/01/2021 Chemotherapy   Patient is on Treatment Plan : BREAST Docetaxel (75) q21d     11/18/2021 -  Chemotherapy   She started on Niraparib   12/03/2021 Tumor Marker   Patient's tumor was tested for the following markers: CA-125. Results of the tumor marker test revealed 45.5.     PHYSICAL EXAMINATION: ECOG PERFORMANCE STATUS: 1 - Symptomatic but completely ambulatory  Vitals:   12/06/21 0823  BP: 135/74  Pulse: 70  Resp: 18  Temp: 97.7 F (36.5 C)  SpO2: 100%   Filed Weights   12/06/21 0823  Weight: 176 lb 12.8 oz (80.2 kg)    GENERAL:alert, no distress and comfortable NEURO: alert & oriented x 3 with fluent speech, no focal motor/sensory deficits  LABORATORY DATA:  I have reviewed the data as listed    Component Value Date/Time   NA 140 12/06/2021 0726   NA 142 02/06/2017 0742   K 4.3 12/06/2021 0726   K 3.9 02/06/2017 0742   CL 106 12/06/2021 0726   CO2  28 12/06/2021 0726   CO2 22 02/06/2017 0742   GLUCOSE 130 (H) 12/06/2021 0726   GLUCOSE 156 (H) 02/06/2017 0742   BUN 18 12/06/2021 0726   BUN 13.5 02/06/2017 0742   CREATININE 1.03 (H) 12/06/2021 0726   CREATININE 0.87 10/30/2021 0732   CREATININE 0.8 02/06/2017 0742   CALCIUM 8.9 12/06/2021 0726   CALCIUM 8.5 02/06/2017 0742   PROT 7.2 12/06/2021 0726  PROT 6.9 02/06/2017 0742   ALBUMIN 4.0 12/06/2021 0726   ALBUMIN 3.7 02/06/2017 0742   AST 20 12/06/2021 0726   AST 19 10/30/2021 0732   AST 47 (H) 02/06/2017 0742   ALT 17 12/06/2021 0726   ALT 17 10/30/2021 0732   ALT 54 02/06/2017 0742   ALKPHOS 107 12/06/2021 0726   ALKPHOS 130 02/06/2017 0742   BILITOT 0.6 12/06/2021 0726   BILITOT 0.6 10/30/2021 0732   BILITOT 0.65 02/06/2017 0742   GFRNONAA 57 (L) 12/06/2021 0726   GFRNONAA >60 10/30/2021 0732   GFRAA >60 11/09/2019 0953   GFRAA >60 10/26/2019 0722    No results found for: "SPEP", "UPEP"  Lab Results  Component Value Date   WBC 3.4 (L) 12/06/2021   NEUTROABS 2.4 12/06/2021   HGB 12.5 12/06/2021   HCT 37.9 12/06/2021   MCV 95.2 12/06/2021   PLT 77 (L) 12/06/2021      Chemistry      Component Value Date/Time   NA 140 12/06/2021 0726   NA 142 02/06/2017 0742   K 4.3 12/06/2021 0726   K 3.9 02/06/2017 0742   CL 106 12/06/2021 0726   CO2 28 12/06/2021 0726   CO2 22 02/06/2017 0742   BUN 18 12/06/2021 0726   BUN 13.5 02/06/2017 0742   CREATININE 1.03 (H) 12/06/2021 0726   CREATININE 0.87 10/30/2021 0732   CREATININE 0.8 02/06/2017 0742      Component Value Date/Time   CALCIUM 8.9 12/06/2021 0726   CALCIUM 8.5 02/06/2017 0742   ALKPHOS 107 12/06/2021 0726   ALKPHOS 130 02/06/2017 0742   AST 20 12/06/2021 0726   AST 19 10/30/2021 0732   AST 47 (H) 02/06/2017 0742   ALT 17 12/06/2021 0726   ALT 17 10/30/2021 0732   ALT 54 02/06/2017 0742   BILITOT 0.6 12/06/2021 0726   BILITOT 0.6 10/30/2021 0732   BILITOT 0.65 02/06/2017 2060

## 2021-12-13 ENCOUNTER — Ambulatory Visit: Payer: Medicare Other | Admitting: Hematology and Oncology

## 2021-12-13 ENCOUNTER — Other Ambulatory Visit: Payer: Medicare Other

## 2021-12-13 ENCOUNTER — Encounter: Payer: Self-pay | Admitting: Hematology and Oncology

## 2021-12-13 ENCOUNTER — Inpatient Hospital Stay (HOSPITAL_BASED_OUTPATIENT_CLINIC_OR_DEPARTMENT_OTHER): Payer: Medicare Other | Admitting: Hematology and Oncology

## 2021-12-13 ENCOUNTER — Inpatient Hospital Stay: Payer: Medicare Other | Attending: Hematology and Oncology

## 2021-12-13 ENCOUNTER — Ambulatory Visit: Payer: Medicare Other

## 2021-12-13 VITALS — BP 141/65 | HR 75 | Temp 97.8°F | Resp 18 | Ht 62.0 in | Wt 178.6 lb

## 2021-12-13 DIAGNOSIS — C482 Malignant neoplasm of peritoneum, unspecified: Secondary | ICD-10-CM | POA: Insufficient documentation

## 2021-12-13 DIAGNOSIS — Z79899 Other long term (current) drug therapy: Secondary | ICD-10-CM | POA: Insufficient documentation

## 2021-12-13 DIAGNOSIS — K769 Liver disease, unspecified: Secondary | ICD-10-CM | POA: Diagnosis not present

## 2021-12-13 DIAGNOSIS — D61818 Other pancytopenia: Secondary | ICD-10-CM | POA: Insufficient documentation

## 2021-12-13 DIAGNOSIS — R161 Splenomegaly, not elsewhere classified: Secondary | ICD-10-CM | POA: Diagnosis not present

## 2021-12-13 DIAGNOSIS — Z9221 Personal history of antineoplastic chemotherapy: Secondary | ICD-10-CM | POA: Insufficient documentation

## 2021-12-13 LAB — COMPREHENSIVE METABOLIC PANEL
ALT: 19 U/L (ref 0–44)
AST: 22 U/L (ref 15–41)
Albumin: 4 g/dL (ref 3.5–5.0)
Alkaline Phosphatase: 107 U/L (ref 38–126)
Anion gap: 6 (ref 5–15)
BUN: 17 mg/dL (ref 8–23)
CO2: 29 mmol/L (ref 22–32)
Calcium: 8.7 mg/dL — ABNORMAL LOW (ref 8.9–10.3)
Chloride: 107 mmol/L (ref 98–111)
Creatinine, Ser: 1.03 mg/dL — ABNORMAL HIGH (ref 0.44–1.00)
GFR, Estimated: 57 mL/min — ABNORMAL LOW (ref >60)
Glucose, Bld: 98 mg/dL (ref 70–99)
Potassium: 4.3 mmol/L (ref 3.5–5.1)
Sodium: 142 mmol/L (ref 135–145)
Total Bilirubin: 0.5 mg/dL (ref 0.3–1.2)
Total Protein: 7.4 g/dL (ref 6.5–8.1)

## 2021-12-13 LAB — CBC WITH DIFFERENTIAL/PLATELET
Abs Immature Granulocytes: 0 10*3/uL (ref 0.00–0.07)
Basophils Absolute: 0 10*3/uL (ref 0.0–0.1)
Basophils Relative: 0 %
Eosinophils Absolute: 0.1 10*3/uL (ref 0.0–0.5)
Eosinophils Relative: 3 %
HCT: 38.2 % (ref 36.0–46.0)
Hemoglobin: 12.6 g/dL (ref 12.0–15.0)
Immature Granulocytes: 0 %
Lymphocytes Relative: 25 %
Lymphs Abs: 0.8 10*3/uL (ref 0.7–4.0)
MCH: 31.4 pg (ref 26.0–34.0)
MCHC: 33 g/dL (ref 30.0–36.0)
MCV: 95.3 fL (ref 80.0–100.0)
Monocytes Absolute: 0.2 10*3/uL (ref 0.1–1.0)
Monocytes Relative: 5 %
Neutro Abs: 2.2 10*3/uL (ref 1.7–7.7)
Neutrophils Relative %: 67 %
Platelets: 96 10*3/uL — ABNORMAL LOW (ref 150–400)
RBC: 4.01 MIL/uL (ref 3.87–5.11)
RDW: 14.4 % (ref 11.5–15.5)
WBC: 3.4 10*3/uL — ABNORMAL LOW (ref 4.0–10.5)
nRBC: 0 % (ref 0.0–0.2)

## 2021-12-13 NOTE — Assessment & Plan Note (Signed)
Her counts has improved with recent dose change  Recommend she continue on 100 mg of niraparib daily and I will space out her follow-up to 2 weeks I recommend repeat imaging study after 3 months of treatment, after the new year

## 2021-12-13 NOTE — Progress Notes (Signed)
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   All questions were answered. The patient knows to call the clinic with any problems, questions or concerns. The total time spent in the appointment was 20 minutes encounter with patients including review of chart and various tests results, discussions about plan of care and coordination of care plan   [name], MD 12/13/2021 9:09 AM  INTERVAL HISTORY: Please see below for problem oriented charting. she returns for treatment follow-up on single agent niraparib She denies side effects from treatment No recent infection or bleeding Denies recent nausea or changes in bowel habits  REVIEW OF SYSTEMS:   Constitutional: Denies fevers, chills or abnormal weight loss Eyes: Denies blurriness of vision Ears, nose, mouth, throat, and face: Denies mucositis or sore throat Respiratory: Denies cough, dyspnea or wheezes Cardiovascular: Denies palpitation, chest discomfort or lower extremity swelling Gastrointestinal:  Denies nausea, heartburn or change in bowel habits Skin: Denies abnormal skin rashes Lymphatics: Denies new lymphadenopathy or  easy bruising Neurological:Denies numbness, tingling or new weaknesses Behavioral/Psych: Mood is stable, no new changes  All other systems were reviewed with the patient and are negative.  I have reviewed the past medical history, past surgical history, social history and family history with the patient and they are unchanged from previous note.  ALLERGIES:  is allergic to carboplatin, shellfish allergy, vancomycin, benadryl [diphenhydramine hcl], penicillins, and tape.  MEDICATIONS:  Current Outpatient Medications  Medication Sig Dispense Refill   calcium elemental as carbonate (BARIATRIC TUMS ULTRA) 400 MG chewable tablet Chew 1 tablet by mouth 2 (two) times daily.     Cholecalciferol (VITAMIN D) 50 MCG (2000 UT) tablet Take 2,000 Units by mouth at bedtime.     gabapentin (NEURONTIN) 300 MG capsule Take 3 capsules (900 mg total) by mouth 2 (two) times daily. 120 capsule 11   HYDROcodone-acetaminophen (NORCO/VICODIN) 5-325 MG tablet Take 1 tablet by mouth every 6 (six) hours as needed for moderate pain. 60 tablet 0   insulin degludec (TRESIBA FLEXTOUCH) 100 UNIT/ML FlexTouch Pen Inject 28 Units into the skin daily.     lidocaine-prilocaine (EMLA) cream Apply to Porta-cath  1-2 hours prior to access as directed. (Patient taking differently: Apply 1 application  topically daily as needed (port access).) 30 g 9   niraparib tosylate (ZEJULA) 100 MG tablet Take 1 tablet (100 mg total) by mouth daily. 30 tablet 9   ondansetron (ZOFRAN) 8 MG tablet TAKE 1 TABLET(8 MG) BY MOUTH EVERY 8 HOURS AS NEEDED 60 tablet 1   OZEMPIC, 0.25 OR 0.5 MG/DOSE, 2 MG/1.5ML SOPN Inject 0.25 mg into the skin every Saturday.     pantoprazole (PROTONIX) 40 MG tablet Take 1 tablet (40  mg total) by mouth daily.     rotigotine (NEUPRO) 4 MG/24HR APPLY 1 PATCH TOPICALLY TO THE SKIN DAILY 90 patch 0   No current facility-administered medications for this visit.    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 Had allergic reaction to carboplatin. Avastin was stopped due to GI hemorrhage   Primary peritoneal carcinomatosis ([place])  09/06/2011 Imaging   CT findings consistent with cirrhotic changes involving the liver.  No worrisome liver mass.  There are associated portal venous collaterals and splenomegaly consistent with portal venous hypertension. 2.  No other significant upper abdominal findings   11/08/2013 Imaging   US showed ascites   11/08/2013 Initial Diagnosis   Patient had ~ 2 months of some urinary incontinence, saw Dr Ronita Hipps in 10-2013, with pelvic mass on exam    11/12/2013 Imaging   There are findings of progressive hepatic cirrhosis with a large amount of ascites and mild splenomegaly. There are venous collaterals present. 2. There is abnormal thickening of the peritoneal surface along the left mid and lower abdominal wall. There is abnormal soft tissue density in the pelvis as well which could reflect the clinically suspected ovarian malignancy but the findings are nondiagnostic. 3. There is a new small left pleural effusion. 4. There is no acute bowel abnormality.   11/18/2013 Imaging   Successful ultrasound guided diagnostic and therapeutic paracentesis yielding 4.1 liters of ascites.     11/18/2013 Pathology Results   PERITONEAL/ASCITIC FLUID (SPECIMEN 1 OF 1 COLLECTED 11/18/13): SEROUS CARCINOMA, PLEASE SEE COMMENT.   12/01/2013 Tumor Marker   Patient's tumor was tested for the following markers: CA125 Results of the tumor marker test revealed 1938   12/03/2013 Imaging   Successful ultrasound-guided therapeutic paracentesis yielding 3.6 liters of peritoneal fluid.   12/07/2013 - 05/30/2014 Chemotherapy   She received 3 cycles of carboplatin and Taxol, interrupted cycle 4 for surgery.  She subsequently completed 3 more cycles of chemotherapy after surgery   12/20/2013 Imaging   Successful  ultrasound-guided diagnostic and therapeutic paracentesis yielding 2.2 liters of peritoneal fluid   12/20/2013 Pathology Results   PERITONEAL/ASCITIC FLUID(SPECIMEN 1 OF 1 COLLECTED 12/20/13): MALIGNANT CELLS CONSISTENT WITH SEROUS CARCINOMA   01/13/2014 Tumor Marker   Patient's tumor was tested for the following markers: CA125 Results of the tumor marker test revealed 227   02/07/2014 Tumor Marker   Patient's tumor was tested for the following markers: CA125 Results of the tumor marker test revealed 130   02/15/2014 Genetic Testing   Genetics testing from 02-2014 normal ([employer] panel)   02/23/2014 Imaging   Interval decrease in ascites. 2. Morphologic changes in the liver consistent with cirrhosis. 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 Portal vein remains patent at this time. 3. Persistent but decreased abnormal soft tissue attenuation tracking in the omental fat. This may be secondary to interval improvement in metastatic disease. 4. Peritoneal thickening seen in the left abdomen and pelvis on the previous study has decreased and nearly resolved in the interval. This also suggests interval improvement in metastatic disease.     03/07/2014 Procedure   Ultrasound and fluoroscopically guided right internal jugular single lumen power port catheter insertion. Tip in the SVC/RA junction. Catheter ready for use.   03/17/2014 Tumor Marker   Patient's tumor was tested for the following markers: CA125 Results of the tumor marker test revealed 45   03/29/2014 Pathology Results   1. Omentum,  resection for tumor - HIGH GRADE SEROUS CARCINOMA, SEE COMMENT. 2. Ovary and fallopian tube, right - HIGH GRADE SEROUS CARCINOMA, SEE COMMENT. 3. Ovary and fallopian tube, left - HIGH GRADE SEROUS CARCINOMA, SEE COMMENT. Diagnosis Note 1. Nests and clusters of malignant cells are invading the omental tissue (part #1) with associated fibrosis. The cells are pleomorphic with  prominent nucleoli. There are scattered psammoma bodies. The ovaries are atrophic and exhibit multiple foci of surface based invasive carcinoma and associated fibrosis. The fallopian tubes have a few foci of carcinoma, also superficially located. There are no precursor lesions noted in the ovaries or fallopian tubes (the entire tubes and ovaries were submitted for evaluation). While there is some retraction artifact, there are several foci suspicious for lymphovascular invasion. 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 Given the fibrosis, there does appear to be a small amount of treatment effect, however, there is abundant residual tumor.    03/29/2014 Surgery   Procedure(s) Performed: 1. Exploratory laparotomy with bilateral salpingo-oophorectomy, omentectomy radical tumor debulking for ovarian cancer .   Surgeon: Thereasa Solo, MD.   Assistant Surgeon: Lahoma Crocker, M.D. Assistant: (an MD assistant was necessary for tissue manipulation, retraction and positioning due to the complexity of the case and hospital policies).  Operative Findings: 10cm omental cake from hepatic to splenic flexure, densely adherent to transverse colon. Milliary studding of tumor implants (<32m, too numerous in number to count) adherent to the mesentery of the small bowel and small bowel wall. Small volume (100cc) ascites. Small ovaries bilaterally, densely adherent to the pelvic cul de sac. Left ovary and tube densely adherent to sigmoid colon. Cirrhotic liver with hepatomegaly and splenomegaly.     This represented an optimal cytoreduction (R1) with visible disease residual on the bowel wall and mesentery (millial, <353mimplants).      04/18/2014 Tumor Marker   Patient's tumor was tested for the following markers: CA125 Results of the tumor marker test revealed 122   05/16/2014 Tumor  Marker   Patient's tumor was tested for the following markers: CA125 Results of the tumor marker test revealed 30   06/10/2014 Tumor Marker   Patient's tumor was tested for the following markers: CA125 Results of the tumor marker test revealed 19   07/04/2014 Imaging   Interval improvement in the appearance of peritoneal metastasis secondary to ovarian cancer. 2. 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     08/18/2014 Tumor Marker   Patient's tumor was tested for the following markers: CA125 Results of the tumor marker test revealed 16   10/13/2014 Tumor Marker   Patient's tumor was tested for the following markers: CA125 Results of the tumor marker test revealed 14   11/21/2014 Tumor Marker   Patient's tumor was tested for the following markers: CA125 Results of the tumor marker test revealed 16   12/28/2014 Tumor Marker   Patient's tumor was tested for the following markers: CA125 Results of the tumor marker test revealed 762   02/27/2015 Tumor Marker   Patient's tumor was tested for the following markers: CA125 Results of the tumor marker test revealed 20.2   03/22/2015 Imaging   Filler appearance to the prior exam, with very subtle fluid tracking along portions of the liver, and very minimal nodularity along the right paracolic gutter representing residua from the prior peritoneal cancer. The current abnormalities could simply  be post therapy findings rather than necessarily representing residual malignancy. No new or enlarging lesions are identified. 2. Hepatic cirrhosis and splenomegaly. There is some gastric varices suggesting portal venous hypertension. 3. Left foraminal stenosis at L4-5 due to spurring. There is likely also some central narrowing of the thecal sac at this level. 4. Bibasilar scarring. 5. Chronic mild left mid kidney scarring   03/27/2015 Tumor Marker   Patient's tumor was tested for the following markers: CA125 Results of the tumor marker  test revealed 25.3   04/24/2015 Imaging   Disc bulge L2-3 with mild spinal stenosis and right foraminal encroachment. 2. Disc bulge L3-4 with mild spinal stenosis. 3. Multifactorial spinal, left lateral recess and foraminal stenosis L4-5. There is grade 1 anterolisthesis without evident dynamic instability.   05/29/2015 Tumor Marker   Patient's tumor was tested for the following markers: CA125 Results of the tumor marker test revealed 29.9   08/21/2015 Tumor Marker   Patient's tumor was tested for the following markers: CA125 Results of the tumor marker test revealed 45   09/18/2015 Tumor Marker   Patient's tumor was tested for the following markers: CA125 Results of the tumor marker test revealed 47   09/27/2015 Imaging   New small bowel mesenteric nodule and enlarged left external iliac lymph node, highly worrisome for metastatic disease. 2. Cirrhosis and splenomegaly   10/10/2015 Tumor Marker   Patient's tumor was tested for the following markers: CA125 Results of the tumor marker test revealed 53.4   10/10/2015 - 12/11/2015 Chemotherapy   She received 4 cycles of carboplatin only   11/20/2015 Tumor Marker   Patient's tumor was tested for the following markers: CA125 Results of the tumor marker test revealed 41.3   12/20/2015 Imaging   CT: Mild left external iliac lymphadenopathy is slightly decreased. Mildly enlarged lower mesenteric nodule is slightly decreased. 2. No new or progressive metastatic disease in the abdomen or pelvis. 3. Cirrhosis. Stable subcentimeter hypodense left liver lobe lesion. No new liver lesions. 4. Stable mild splenomegaly.  No ascites. 5. Mild sigmoid diverticulosis.   01/01/2016 -  Anti-estrogen oral therapy   She was placed on tamoxifen. Had letrozole initially but switched to tamoxifen due to poor tolerance   01/15/2016 Tumor Marker   Patient's tumor was tested for the following markers: CA125 Results of the tumor marker test revealed 31.4   02/19/2016  Tumor Marker   Patient's tumor was tested for the following markers: CA125 Results of the tumor marker test revealed 36.5   05/13/2016 Imaging   No evidence of disease progression within the abdomen or pelvis. Previously noted small central mesenteric nodule and left external iliac node appear slightly smaller. 2. Stable changes of hepatic cirrhosis and portal hypertension with associated splenomegaly. No new or enlarging hepatic lesions are identified. 3. No acute findings.   05/13/2016 Tumor Marker   Patient's tumor was tested for the following markers: CA125 Results of the tumor marker test revealed 41.2   06/24/2016 Tumor Marker   Patient's tumor was tested for the following markers: CA125 Results of the tumor marker test revealed 47.3   08/20/2016 Imaging   1. The left external iliac lymph node is slightly increased in size compared to the prior exam, currently 1.1 cm and previously 0.9 cm in diameter. 2. Stable central mesenteric lymph node at 0.9 cm in diameter. 3. Stable trace free pelvic fluid and stable slight thickening along the right paracolic gutter, without well-defined peritoneal nodularity. 4. Other imaging findings of potential clinical  significance: Subsegmental atelectasis or scarring in the lung bases. Hepatic cirrhosis. Left renal scarring. Mild splenomegaly. Sigmoid colon diverticulosis. Impingement at L4-5 due to spondylosis and degenerative disc disease broad Schmorl' s nodes at L3-L4. Pelvic floor laxity   08/23/2016 Tumor Marker   Patient's tumor was tested for the following markers: CA125 Results of the tumor marker test revealed 80.2   09/04/2016 Imaging   LV EF: 60% -   65%   09/12/2016 Tumor Marker   Patient's tumor was tested for the following markers: CA125 Results of the tumor marker test revealed 98.5   09/12/2016 - 01/29/2018 Chemotherapy   The patient received Doxil and Avastin. Avastin was discontinued in Dec 2019 due to GI hemorrhage   09/26/2016 Tumor  Marker   Patient's tumor was tested for the following markers: CA125 Results of the tumor marker test revealed 68.9   10/10/2016 Tumor Marker   Patient's tumor was tested for the following markers: CA125 Results of the tumor marker test revealed 65.6   11/07/2016 Tumor Marker   Patient's tumor was tested for the following markers: CA125 Results of the tumor marker test revealed 46.4   11/29/2016 Imaging   Stable mild peritoneal thickening, suspicious for peritoneal carcinomatosis. No new or progressive disease identified. No evidence of ascites.  No significant change in 10 mm left external iliac and 8 mm mesenteric lymph nodes.  Stable hepatic cirrhosis, and splenomegaly consistent with portal venous hypertension.  Colonic diverticulosis, without radiographic evidence of diverticulitis.   12/02/2016 Imaging   Normal LV size with EF 55%. Basal inferior and basal inferoseptal hypokinesis. Normal RV size and systolic function. No significant valvular abnormalities.   12/05/2016 Tumor Marker   Patient's tumor was tested for the following markers: CA125 Results of the tumor marker test revealed 49.1   01/09/2017 Tumor Marker   Patient's tumor was tested for the following markers: CA125 Results of the tumor marker test revealed 47.2   02/06/2017 Tumor Marker   Patient's tumor was tested for the following markers: CA125 Results of the tumor marker test revealed 44.1   02/18/2017 Imaging   Stable mild peritoneal thickening. No new or progressive disease identified within the abdomen or pelvis.  Stable tiny sub-cm left external iliac and mesenteric lymph nodes.  Stable hepatic cirrhosis, and splenomegaly consistent with portal venous hypertension. No evidence of hepatic neoplasm.  Colonic diverticulosis, without radiographic evidence of diverticulitis.   03/06/2017 Tumor Marker   Patient's tumor was tested for the following markers: CA125 Results of the tumor marker test revealed  50.4   03/17/2017 Imaging   - Left ventricle: The cavity size was normal. Wall thickness was normal. Systolic function was normal. The estimated ejection fraction was in the range of 55% to 60%. Wall motion was normal; there were no regional wall motion abnormalities. Features are consistent with a pseudonormal left ventricular filling pattern, with concomitant abnormal relaxation and increased filling pressure (grade 2 diastolic dysfunction). - Mitral valve: There was mild regurgitation. - Left atrium: The atrium was mildly dilated   04/03/2017 Tumor Marker   Patient's tumor was tested for the following markers: CA125 Results of the tumor marker test revealed 47.2   05/14/2017 Imaging   CT scan of abdomen and pelvis Stable mild peritoneal thickening. No new or progressive disease within the abdomen or pelvis.  Stable hepatic cirrhosis. Stable splenomegaly, consistent with portal venous hypertension. No evidence of hepatic neoplasm.   05/15/2017 Tumor Marker   Patient's tumor was tested for the following markers: CA125  Results of the tumor marker test revealed 56.1   06/12/2017 Tumor Marker   Patient's tumor was tested for the following markers: CA125 Results of the tumor marker test revealed 49.2   07/10/2017 Tumor Marker   Patient's tumor was tested for the following markers: CA125 Results of the tumor marker test revealed 46.3   08/07/2017 Tumor Marker   Patient's tumor was tested for the following markers: CA125 Results of the tumor marker test revealed 52.9   08/20/2017 Imaging   1. Mild omental/peritoneal haziness, unchanged. No evidence of new metastatic disease. 2. Cirrhosis with splenomegaly.   09/04/2017 Tumor Marker   Patient's tumor was tested for the following markers: CA125 Results of the tumor marker test revealed 48.5   11/13/2017 Tumor Marker   Patient's tumor was tested for the following markers: CA125 Results of the tumor marker test revealed 55.8   12/17/2017 Imaging    1. No findings to suggest metastatic disease in the abdomen or pelvis. 2. Severe hepatic cirrhosis with evidence of portal hypertension, as demonstrated by dilated portal vein, splenomegaly and portosystemic collateral pathways, including gastric and esophageal varices.  3. Colonic diverticulosis without evidence of acute diverticulitis at this time. 4. Additional incidental findings, as above.   01/16/2018 Tumor Marker   Patient's tumor was tested for the following markers: CA125 Results of the tumor marker test revealed 63.7   02/10/2018 Procedure   EGD - Recently bleeding grade III and large (> 5 mm) esophageal varices. Completely eradicated. Banded. - Portal hypertensive gastropathy. - Normal examined duodenum. - No specimens collected   02/13/2018 Tumor Marker   Patient's tumor was tested for the following markers: CA125 Results of the tumor marker test revealed 82.7   02/25/2018 Imaging   Bone density showed mild osteopenia   04/08/2018 Tumor Marker   Patient's tumor was tested for the following markers: CA125 Results of the tumor marker test revealed 82.7   04/08/2018 Imaging   1. No definite omental or peritoneal surface lesions. However, there are 2 slowly enlarging lymph nodes noted. One is in the small bowel mesentery and the other is in the left deep pelvis. Could not exclude recurrent disease. 2. Stable advanced cirrhotic changes involving the liver with portal venous hypertension, portal venous collaterals, esophageal varices and splenomegaly. No worrisome hepatic lesions. 3. Diffuse wall thickening of the colon could suggest diffuse colitis or could be due to low albumin. Recommend correlation with any symptoms such as diarrhea.   05/21/2018 Tumor Marker   Patient's tumor was tested for the following markers: CA125 Results of the tumor marker test revealed 86   09/07/2018 Tumor Marker   Patient's tumor was tested for the following markers:CA-125 Results of the tumor  marker test revealed 111   09/29/2018 Tumor Marker   Patient's tumor was tested for the following markers: CA-125 Results of the tumor marker test revealed 121.   09/29/2018 - 11/24/2018 Chemotherapy   The patient had carboplatin and gemzar for chemotherapy treatment.  Chemo was stopped due to allergic reaction to carboplatin   10/27/2018 Tumor Marker   Patient's tumor was tested for the following markers: CA-125 Results of the tumor marker test revealed 99.9   11/24/2018 Tumor Marker   Patient's tumor was tested for the following markers: CA-125 Results of the tumor marker test revealed 81.2   12/14/2018 Tumor Marker   Patient's tumor was tested for the following markers: CA-125 Results of the tumor marker test revealed 81.2   12/14/2018 Imaging   1. Mild  improvement in peritoneal disease. Peritoneal nodule within the left posterior pelvis contiguous with the left side of vaginal cuff is slightly decreased in size in the interval. Within the right iliac fossa there is a small peritoneal nodule which has decreased in size in the interval. Central small bowel mesenteric lymph node is not significantly changed. Slight decrease in size left periaortic lymph node.    2. Morphologic features of the liver compatible with cirrhosis. Splenomegaly is noted which may reflect portal venous hypertension.   12/21/2018 Tumor Marker   Patient's tumor was tested for the following markers: CA-125 Results of the tumor marker test revealed 64.2   12/21/2018 -  Chemotherapy   The patient had taxotere for chemotherapy treatment.     03/02/2019 Tumor Marker   Patient's tumor was tested for the following markers: CA-125. 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   No new or progressive metastatic disease within the abdomen or pelvis.   Hepatic cirrhosis and findings of portal venous hypertension. 1.1 cm  low-attenuation lesion within left hepatic lobe, which could represent a regenerative nodule or small hepatocellular carcinoma. Recommend abdomen MRI without and with contrast for further characterization.     04/13/2019 Tumor Marker   Patient's tumor was tested for the following markers: CA-125 Results of the tumor marker test revealed 36.8   05/26/2019 Tumor Marker   Patient's tumor was tested for the following markers: CA-125 Results of the tumor marker test revealed 31.6.   06/15/2019 Tumor Marker   Patient's tumor was tested for the following markers: CA-125 Results of the tumor marker test revealed 25.4   07/05/2019 Imaging   1. Stable CT examination. Stable peritoneal thickening in the pelvis and paracolic gutters. Stable solid left deep pelvic implant. Stable trace ascites. No new or progressive metastatic disease. 2. Stable cirrhosis. No discrete liver masses. 3. Stable mild-to-moderate splenomegaly. 4. Aortic Atherosclerosis (ICD10-I70.0).   07/27/2019 Tumor Marker   Patient's tumor was tested for the following markers: CA-125 Results of the tumor marker test revealed 35.4   08/17/2019 Tumor Marker   Patient's tumor was tested for the following markers: Ca-125 Results of the tumor marker test revealed 35.7.   09/17/2019 - 09/21/2019 Hospital Admission   She was admitted due to esophageal variceal bleeding presents with hematemesis and melanotic stool with epigastric pain.  GI consulted, started on octreotide infusion.  Underwent EGD which showed minimal bleeding, remain on octreotide drip, PPI drip.  Hemoglobin trended down therefore received PRBC transfusion.  Over several days her bleeding subsided, hemodynamically remained stable.    09/17/2019 Procedure   EGD report - Esophageal mucosal scarring, from prior esophageal banding sessions. - Grade I esophageal varices. - Red blood in the cardia and in the gastric fundus. - Portal hypertensive gastropathy. - Normal duodenal bulb,  first portion of the duodenum and second portion of the duodenum. 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 2. Otherwise stable CT exam. Stable peritoneal thickening within the bilateral deep pelvic peritoneum. Stable left deep pelvic implant.No evidence for progressed disease. 3. Stable splenomegaly. 4. Morphologic changes to  the liver compatible with cirrhosis. Trace perihepatic and pelvic ascites.   02/22/2020 Imaging   1. New lucent lesion in the left side of the L4 vertebral body measuring 1 cm, concerning for possible osseous metastasis. Consideration for further evaluation with bone scan is suggested. Close attention on follow-up imaging is also recommended to ensure stability. 2. Previously noted peritoneal thickening and nodularity in the low anatomic pelvis has regressed slightly. 3. Borderline enlarged and mildly enlarged pelvic and retroperitoneal lymph nodes appear similar to slightly increased compared to the prior examination, as above. Continued attention on follow-up studies is recommended.  4. 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 5. Aortic atherosclerosis. 6. Additional incidental findings, as above.   02/22/2020 Tumor Marker   Patient's tumor was tested for the following markers: CA-125 Results of the tumor marker test revealed 30.1   04/06/2020 Tumor Marker   Patient's tumor was tested for the following markers: CA-125 Results of the tumor marker test revealed 35.7   09/11/2020 Tumor Marker   Patient's tumor  was tested for the following markers: CA-125. Results of the tumor marker test revealed 26.6.   09/12/2020 Imaging   1. Slow enlargement of a currently 2.9 cubic cm soft tissue nodule along the left adnexa. Tumor deposit not excluded. 2. The left external iliac lymph node has substantially reduced in size, currently 0.9 cm in short axis and formerly 1.4 cm. 3. 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 4. Cirrhosis with portal venous hypertension. 5. Mild distal esophageal wall thickening, probably from esophagitis. Splenomegaly. 6. Scarring in the left kidney. 7. Sigmoid colon diverticulosis. 8. Pelvic floor laxity with small cystocele. 9. Lumbar impingement at L2-3, L3-4, and L4-5.     10/23/2020 Tumor Marker   Patient's tumor was tested for the following markers: CA-125. Results of the tumor marker test revealed 35.3.   12/12/2020 Tumor Marker   Patient's tumor was tested for the following markers: CA-125. Results of the tumor marker test revealed 39.1.   12/13/2020 Imaging   1. Continued increase in size of soft tissue nodule within the left posterior pelvis adjacent to the sigmoid colon. Cannot exclude small tumor deposit. 2. Similar appearance of thickening and mild nodularity along the peritoneal reflections of the pelvis and pericolic gutters. 3. Morphologic findings of the liver compatible with cirrhosis. 4. Splenomegaly. 5. Aortic Atherosclerosis (ICD10-I70.0).   01/24/2021 Tumor Marker   Patient's tumor was tested for the following markers: CA-125. Results of the tumor marker test revealed 56.2.   02/09/2021 Imaging   1. Mild interval increased size of the solid left adnexal mass which may represent tumor recurrence/deposit. The mass abuts the adjacent sigmoid colon as well as the left posterior wall of the urinary bladder, invasion not excluded. 2. Otherwise  no new mass or lymphadenopathy identified in the abdomen or pelvis. 3. Hepatic cirrhosis. Splenomegaly consistent with portal hypertension.   04/11/2021 Imaging   1. Left posterior pelvic soft tissue mass, adjacent to the sigmoid colon, has slightly increased in size in comparison to recent prior with significant interval increase in size over more remote priors, findings which are highly suspicious for active neoplastic disease involvement. 2. No new discrete omental or peritoneal nodules/masses identified. 3. Similar nodular thickening of the peritoneum along the bilateral pericolic gutters and in the pelvis without ascites. 4. No pathologically enlarged abdominal or pelvic lymph nodes and no solid organ  metastases visualized. 5. Cirrhotic hepatic morphology with evidence of portal hypertension including splenomegaly. 6. Sigmoid colonic diverticulosis without findings of acute diverticulitis. 7. Moderate volume of formed stool throughout the colon. 8. Aortic Atherosclerosis (ICD10-I70.0).   04/11/2021 Tumor Marker   Patient's tumor was tested for the following markers: CA-125. Results of the tumor marker test revealed 79.4 .   04/23/2021 - 10/09/2021 Chemotherapy   Patient is on Treatment Plan : Ovarian Docetaxel q21d     04/24/2021 Tumor Marker   Patient's tumor was tested for the following markers: CA-125. Results of the tumor marker test revealed 68.5.   05/16/2021 Tumor Marker   Patient's tumor was tested for the following markers: CA-125. Results of the tumor marker test revealed 68.1.   06/28/2021 Tumor Marker   Patient's tumor was tested for the following markers: CA-125. Results of the tumor marker test revealed 52.   07/16/2021 Imaging   1. Stable cirrhotic changes involving the liver. Associated portal venous hypertension, portal venous collaterals and splenomegaly. No hepatic lesions. 2. Slight interval decrease in size of the left pelvic implant. 3. Stable minimal peritoneal  nodularity involving the pericolic gutters bilaterally. No new or progressive findings.     08/09/2021 Tumor Marker   Patient's tumor was tested for the following markers: CA-125. Results of the tumor marker test revealed 44.1.   09/20/2021 Tumor Marker   Patient's tumor was tested for the following markers: CA-125. Results of the tumor marker test revealed 38.2.   10/31/2021 Imaging   1. Stable exam. No new or progressive interval findings. 2. Stable appearance of the soft tissue nodule in the left pelvis. 3. Subtle omental nodule is similar to prior. The minimal peritoneal nodularity described previously in the para colic gutters is similar to prior. 4. Cirrhosis with splenomegaly. 5. Cholelithiasis. 6. Left colonic diverticulosis without diverticulitis. 7. Aortic Atherosclerosis (ICD10-I70.0).   10/31/2021 Tumor Marker   Patient's tumor was tested for the following markers: Ca-125. Results of the tumor marker test revealed 37.7.   11/01/2021 - 11/01/2021 Chemotherapy   Patient is on Treatment Plan : BREAST Docetaxel (77) q21d     11/18/2021 -  Chemotherapy   She started on Niraparib   12/03/2021 Tumor Marker   Patient's tumor was tested for the following markers: CA-125. Results of the tumor marker test revealed 45.5.     PHYSICAL EXAMINATION: ECOG PERFORMANCE STATUS: 0 - Asymptomatic  Vitals:   12/13/21 0820  BP: (!) 141/65  Pulse: 75  Resp: 18  Temp: 97.8 F (36.6 C)  SpO2: 98%   Filed Weights   12/13/21 0820  Weight: 178 lb 9.6 oz (81 kg)    GENERAL:alert, no distress and comfortable NEURO: alert & oriented x 3 with fluent speech, no focal motor/sensory deficits  LABORATORY DATA:  I have reviewed the data as listed    Component Value Date/Time   NA 142 12/13/2021 0746   NA 142 02/06/2017 0742   K 4.3 12/13/2021 0746   K 3.9 02/06/2017 0742   CL 107 12/13/2021 0746   CO2 29 12/13/2021 0746   CO2 22 02/06/2017 0742   GLUCOSE 98 12/13/2021 0746    GLUCOSE 156 (H) 02/06/2017 0742   BUN 17 12/13/2021 0746   BUN 13.5 02/06/2017 0742   CREATININE 1.03 (H) 12/13/2021 0746   CREATININE 0.87 10/30/2021 0732   CREATININE 0.8 02/06/2017 0742   CALCIUM 8.7 (L) 12/13/2021 0746   CALCIUM 8.5 02/06/2017 0742   PROT 7.4 12/13/2021 0746   PROT 6.9  02/06/2017 0742   ALBUMIN 4.0 12/13/2021 0746   ALBUMIN 3.7 02/06/2017 0742   AST 22 12/13/2021 0746   AST 19 10/30/2021 0732   AST 47 (H) 02/06/2017 0742   ALT 19 12/13/2021 0746   ALT 17 10/30/2021 0732   ALT 54 02/06/2017 0742   ALKPHOS 107 12/13/2021 0746   ALKPHOS 130 02/06/2017 0742   BILITOT 0.5 12/13/2021 0746   BILITOT 0.6 10/30/2021 0732   BILITOT 0.65 02/06/2017 0742   GFRNONAA 57 (L) 12/13/2021 0746   GFRNONAA >60 10/30/2021 0732   GFRAA >60 11/09/2019 0953   GFRAA >60 10/26/2019 0722    No results found for: "SPEP", "UPEP"  Lab Results  Component Value Date   WBC 3.4 (L) 12/13/2021   NEUTROABS 2.2 12/13/2021   HGB 12.6 12/13/2021   HCT 38.2 12/13/2021   MCV 95.3 12/13/2021   PLT 96 (L) 12/13/2021      Chemistry      Component Value Date/Time   NA 142 12/13/2021 0746   NA 142 02/06/2017 0742   K 4.3 12/13/2021 0746   K 3.9 02/06/2017 0742   CL 107 12/13/2021 0746   CO2 29 12/13/2021 0746   CO2 22 02/06/2017 0742   BUN 17 12/13/2021 0746   BUN 13.5 02/06/2017 0742   CREATININE 1.03 (H) 12/13/2021 0746   CREATININE 0.87 10/30/2021 0732   CREATININE 0.8 02/06/2017 0742      Component Value Date/Time   CALCIUM 8.7 (L) 12/13/2021 0746   CALCIUM 8.5 02/06/2017 0742   ALKPHOS 107 12/13/2021 0746   ALKPHOS 130 02/06/2017 0742   AST 22 12/13/2021 0746   AST 19 10/30/2021 0732   AST 47 (H) 02/06/2017 0742   ALT 19 12/13/2021 0746   ALT 17 10/30/2021 0732   ALT 54 02/06/2017 0742   BILITOT 0.5 12/13/2021 0746   BILITOT 0.6 10/30/2021 0732   BILITOT 0.65 02/06/2017 5176

## 2021-12-13 NOTE — Assessment & Plan Note (Signed)
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

## 2021-12-18 ENCOUNTER — Other Ambulatory Visit (HOSPITAL_COMMUNITY): Payer: Self-pay

## 2021-12-25 ENCOUNTER — Inpatient Hospital Stay: Payer: Medicare Other

## 2021-12-25 ENCOUNTER — Inpatient Hospital Stay (HOSPITAL_BASED_OUTPATIENT_CLINIC_OR_DEPARTMENT_OTHER): Payer: Medicare Other | Admitting: Hematology and Oncology

## 2021-12-25 ENCOUNTER — Encounter: Payer: Self-pay | Admitting: Hematology and Oncology

## 2021-12-25 VITALS — BP 141/72 | HR 72 | Temp 98.0°F | Resp 18 | Ht 62.0 in | Wt 178.0 lb

## 2021-12-25 DIAGNOSIS — C482 Malignant neoplasm of peritoneum, unspecified: Secondary | ICD-10-CM

## 2021-12-25 DIAGNOSIS — Z5111 Encounter for antineoplastic chemotherapy: Secondary | ICD-10-CM

## 2021-12-25 DIAGNOSIS — D61818 Other pancytopenia: Secondary | ICD-10-CM | POA: Diagnosis not present

## 2021-12-25 LAB — COMPREHENSIVE METABOLIC PANEL
ALT: 19 U/L (ref 0–44)
AST: 21 U/L (ref 15–41)
Albumin: 4.2 g/dL (ref 3.5–5.0)
Alkaline Phosphatase: 103 U/L (ref 38–126)
Anion gap: 7 (ref 5–15)
BUN: 18 mg/dL (ref 8–23)
CO2: 26 mmol/L (ref 22–32)
Calcium: 9.3 mg/dL (ref 8.9–10.3)
Chloride: 107 mmol/L (ref 98–111)
Creatinine, Ser: 0.91 mg/dL (ref 0.44–1.00)
GFR, Estimated: >60 mL/min (ref >60)
Glucose, Bld: 107 mg/dL — ABNORMAL HIGH (ref 70–99)
Potassium: 4.3 mmol/L (ref 3.5–5.1)
Sodium: 140 mmol/L (ref 135–145)
Total Bilirubin: 0.7 mg/dL (ref 0.3–1.2)
Total Protein: 7.6 g/dL (ref 6.5–8.1)

## 2021-12-25 LAB — CBC WITH DIFFERENTIAL/PLATELET
Abs Immature Granulocytes: 0.01 10*3/uL (ref 0.00–0.07)
Basophils Absolute: 0 10*3/uL (ref 0.0–0.1)
Basophils Relative: 0 %
Eosinophils Absolute: 0.1 10*3/uL (ref 0.0–0.5)
Eosinophils Relative: 3 %
HCT: 38.3 % (ref 36.0–46.0)
Hemoglobin: 12.9 g/dL (ref 12.0–15.0)
Immature Granulocytes: 0 %
Lymphocytes Relative: 23 %
Lymphs Abs: 0.8 10*3/uL (ref 0.7–4.0)
MCH: 31.8 pg (ref 26.0–34.0)
MCHC: 33.7 g/dL (ref 30.0–36.0)
MCV: 94.3 fL (ref 80.0–100.0)
Monocytes Absolute: 0.2 10*3/uL (ref 0.1–1.0)
Monocytes Relative: 6 %
Neutro Abs: 2.4 10*3/uL (ref 1.7–7.7)
Neutrophils Relative %: 68 %
Platelets: 86 10*3/uL — ABNORMAL LOW (ref 150–400)
RBC: 4.06 MIL/uL (ref 3.87–5.11)
RDW: 14.3 % (ref 11.5–15.5)
WBC: 3.5 10*3/uL — ABNORMAL LOW (ref 4.0–10.5)
nRBC: 0 % (ref 0.0–0.2)

## 2021-12-25 MED ORDER — HEPARIN SOD (PORK) LOCK FLUSH 100 UNIT/ML IV SOLN
500.0000 [IU] | Freq: Once | INTRAVENOUS | Status: AC
  Administered 2021-12-25: 500 [IU]

## 2021-12-25 MED ORDER — SODIUM CHLORIDE 0.9% FLUSH
10.0000 mL | Freq: Once | INTRAVENOUS | Status: AC
  Administered 2021-12-25: 10 mL

## 2021-12-25 NOTE — Assessment & Plan Note (Signed)
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

## 2021-12-25 NOTE — Assessment & Plan Note (Signed)
Overall, she tolerated treatment well apart from persistent pancytopenia Overall, her pancytopenia is stable She will continue reduced dose niraparib daily I plan to see her again in a month for further follow-up I plan to repeat imaging study after the new year

## 2021-12-25 NOTE — Progress Notes (Signed)
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   All questions were answered. The patient knows to call the clinic with any problems, questions or concerns. The total time spent in the appointment was 20 minutes encounter with patients including review of chart and various tests results, discussions about plan of care and coordination of care plan   [name], MD 12/25/2021 9:25 AM  INTERVAL HISTORY: Please see below for problem oriented charting. she returns for treatment follow-up on niraparib since October 8 She is doing well No recent bleeding Denies recent infection, fever or chills In fact, she have no side effects from treatment so far  REVIEW OF SYSTEMS:   Constitutional: Denies fevers, chills or abnormal weight loss Eyes: Denies blurriness of vision Ears, nose, mouth, throat, and face: Denies mucositis or sore throat Respiratory: Denies cough, dyspnea or wheezes Cardiovascular: Denies palpitation, chest discomfort or lower extremity swelling Gastrointestinal:  Denies nausea, heartburn or change in bowel habits Skin: Denies abnormal skin rashes Lymphatics:  Denies new lymphadenopathy or easy bruising Neurological:Denies numbness, tingling or new weaknesses Behavioral/Psych: Mood is stable, no new changes  All other systems were reviewed with the patient and are negative.  I have reviewed the past medical history, past surgical history, social history and family history with the patient and they are unchanged from previous note.  ALLERGIES:  is allergic to carboplatin, shellfish allergy, vancomycin, benadryl [diphenhydramine hcl], penicillins, and tape.  MEDICATIONS:  Current Outpatient Medications  Medication Sig Dispense Refill   calcium elemental as carbonate (BARIATRIC TUMS ULTRA) 400 MG chewable tablet Chew 1 tablet by mouth 2 (two) times daily.     Cholecalciferol (VITAMIN D) 50 MCG (2000 UT) tablet Take 2,000 Units by mouth at bedtime.     gabapentin (NEURONTIN) 300 MG capsule Take 3 capsules (900 mg total) by mouth 2 (two) times daily. 120 capsule 11   HYDROcodone-acetaminophen (NORCO/VICODIN) 5-325 MG tablet Take 1 tablet by mouth every 6 (six) hours as needed for moderate pain. 60 tablet 0   insulin degludec (TRESIBA FLEXTOUCH) 100 UNIT/ML FlexTouch Pen Inject 28 Units into the skin daily.     lidocaine-prilocaine (EMLA) cream Apply to Porta-cath  1-2 hours prior to access as directed. (Patient taking differently: Apply 1 application  topically daily as needed (port access).) 30 g 9   niraparib tosylate (ZEJULA) 100 MG tablet Take 1 tablet (100 mg total) by mouth daily. 30 tablet 9   ondansetron (ZOFRAN) 8 MG tablet TAKE 1 TABLET(8 MG) BY MOUTH EVERY 8 HOURS AS NEEDED 60 tablet 1   OZEMPIC, 0.25 OR 0.5 MG/DOSE, 2 MG/1.5ML SOPN Inject 0.25 mg into the skin every Saturday.     pantoprazole (PROTONIX) 40 MG tablet Take  1 tablet (40 mg total) by mouth daily.     rotigotine (NEUPRO) 4 MG/24HR APPLY 1 PATCH TOPICALLY TO THE SKIN DAILY 90 patch 0   No current facility-administered medications for this visit.    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 Had allergic reaction to carboplatin. Avastin was stopped due to GI hemorrhage   Primary peritoneal carcinomatosis ([place])  09/06/2011 Imaging   CT findings consistent with cirrhotic changes involving the liver.  No worrisome liver mass.  There are associated portal venous collaterals and splenomegaly consistent with portal venous hypertension. 2.  No other significant upper abdominal findings   11/08/2013 Imaging   US showed ascites   11/08/2013 Initial Diagnosis   Patient had ~ 2 months of some urinary incontinence, saw Dr Ronita Hipps in 10-2013, with pelvic mass on exam    11/12/2013 Imaging   There are findings of progressive hepatic cirrhosis with a large amount of ascites and mild splenomegaly. There are venous collaterals present. 2. There is abnormal thickening of the peritoneal surface along the left mid and lower abdominal wall. There is abnormal soft tissue density in the pelvis as well which could reflect the clinically suspected ovarian malignancy but the findings are nondiagnostic. 3. There is a new small left pleural effusion. 4. There is no acute bowel abnormality.   11/18/2013 Imaging   Successful ultrasound guided diagnostic and therapeutic paracentesis yielding 4.1 liters of ascites.     11/18/2013 Pathology Results   PERITONEAL/ASCITIC FLUID (SPECIMEN 1 OF 1 COLLECTED 11/18/13): SEROUS CARCINOMA, PLEASE SEE COMMENT.   12/01/2013 Tumor Marker   Patient's tumor was tested for the following markers: CA125 Results of the tumor marker test revealed 1938   12/03/2013 Imaging   Successful ultrasound-guided therapeutic paracentesis yielding 3.6 liters of peritoneal fluid.   12/07/2013 - 05/30/2014 Chemotherapy   She received 3 cycles of carboplatin and Taxol, interrupted cycle 4 for surgery.  She subsequently completed 3 more cycles of chemotherapy after surgery    12/20/2013 Imaging   Successful ultrasound-guided diagnostic and therapeutic paracentesis yielding 2.2 liters of peritoneal fluid   12/20/2013 Pathology Results   PERITONEAL/ASCITIC FLUID(SPECIMEN 1 OF 1 COLLECTED 12/20/13): MALIGNANT CELLS CONSISTENT WITH SEROUS CARCINOMA   01/13/2014 Tumor Marker   Patient's tumor was tested for the following markers: CA125 Results of the tumor marker test revealed 227   02/07/2014 Tumor Marker   Patient's tumor was tested for the following markers: CA125 Results of the tumor marker test revealed 130   02/15/2014 Genetic Testing   Genetics testing from 02-2014 normal ([employer] panel)   02/23/2014 Imaging   Interval decrease in ascites. 2. Morphologic changes in the liver consistent with cirrhosis. 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 Portal vein remains patent at this time. 3. Persistent but decreased abnormal soft tissue attenuation tracking in the omental fat. This may be secondary to interval improvement in metastatic disease. 4. Peritoneal thickening seen in the left abdomen and pelvis on the previous study has decreased and nearly resolved in the interval. This also suggests interval improvement in metastatic disease.     03/07/2014 Procedure   Ultrasound and fluoroscopically guided right internal jugular single lumen power port catheter insertion. Tip in the SVC/RA junction. Catheter ready for use.   03/17/2014 Tumor Marker   Patient's tumor was tested for the following markers: CA125 Results of the tumor marker test revealed 45   03/29/2014 Pathology Results  1. Omentum, resection for tumor - HIGH GRADE SEROUS CARCINOMA, SEE COMMENT. 2. Ovary and fallopian tube, right - HIGH GRADE SEROUS CARCINOMA, SEE COMMENT. 3. Ovary and fallopian tube, left - HIGH GRADE SEROUS CARCINOMA, SEE COMMENT. Diagnosis Note 1. Nests and clusters of malignant cells are invading the omental tissue (part #1) with associated  fibrosis. The cells are pleomorphic with prominent nucleoli. There are scattered psammoma bodies. The ovaries are atrophic and exhibit multiple foci of surface based invasive carcinoma and associated fibrosis. The fallopian tubes have a few foci of carcinoma, also superficially located. There are no precursor lesions noted in the ovaries or fallopian tubes (the entire tubes and ovaries were submitted for evaluation). While there is some retraction artifact, there are several foci suspicious for lymphovascular invasion. 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 Given the fibrosis, there does appear to be a small amount of treatment effect, however, there is abundant residual tumor.    03/29/2014 Surgery   Procedure(s) Performed: 1. Exploratory laparotomy with bilateral salpingo-oophorectomy, omentectomy radical tumor debulking for ovarian cancer .   Surgeon: Thereasa Solo, MD.   Assistant Surgeon: Lahoma Crocker, M.D. Assistant: (an MD assistant was necessary for tissue manipulation, retraction and positioning due to the complexity of the case and hospital policies).  Operative Findings: 10cm omental cake from hepatic to splenic flexure, densely adherent to transverse colon. Milliary studding of tumor implants (<36m, too numerous in number to count) adherent to the mesentery of the small bowel and small bowel wall. Small volume (100cc) ascites. Small ovaries bilaterally, densely adherent to the pelvic cul de sac. Left ovary and tube densely adherent to sigmoid colon. Cirrhotic liver with hepatomegaly and splenomegaly.     This represented an optimal cytoreduction (R1) with visible disease residual on the bowel wall and mesentery (millial, <328mimplants).      04/18/2014 Tumor Marker   Patient's tumor was tested for the following markers: CA125 Results of the tumor  marker test revealed 122   05/16/2014 Tumor Marker   Patient's tumor was tested for the following markers: CA125 Results of the tumor marker test revealed 30   06/10/2014 Tumor Marker   Patient's tumor was tested for the following markers: CA125 Results of the tumor marker test revealed 19   07/04/2014 Imaging   Interval improvement in the appearance of peritoneal metastasis secondary to ovarian cancer. 2. 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     08/18/2014 Tumor Marker   Patient's tumor was tested for the following markers: CA125 Results of the tumor marker test revealed 16   10/13/2014 Tumor Marker   Patient's tumor was tested for the following markers: CA125 Results of the tumor marker test revealed 14   11/21/2014 Tumor Marker   Patient's tumor was tested for the following markers: CA125 Results of the tumor marker test revealed 16   12/28/2014 Tumor Marker   Patient's tumor was tested for the following markers: CA125 Results of the tumor marker test revealed 762   02/27/2015 Tumor Marker   Patient's tumor was tested for the following markers: CA125 Results of the tumor marker test revealed 20.2   03/22/2015 Imaging   Filler appearance to the prior exam, with very subtle fluid tracking along portions of the liver, and very minimal nodularity along the right paracolic gutter representing residua from the prior peritoneal cancer. The current abnormalities  could simply be post therapy findings rather than necessarily representing residual malignancy. No new or enlarging lesions are identified. 2. Hepatic cirrhosis and splenomegaly. There is some gastric varices suggesting portal venous hypertension. 3. Left foraminal stenosis at L4-5 due to spurring. There is likely also some central narrowing of the thecal sac at this level. 4. Bibasilar scarring. 5. Chronic mild left mid kidney scarring   03/27/2015 Tumor Marker   Patient's tumor was tested for the following  markers: CA125 Results of the tumor marker test revealed 25.3   04/24/2015 Imaging   Disc bulge L2-3 with mild spinal stenosis and right foraminal encroachment. 2. Disc bulge L3-4 with mild spinal stenosis. 3. Multifactorial spinal, left lateral recess and foraminal stenosis L4-5. There is grade 1 anterolisthesis without evident dynamic instability.   05/29/2015 Tumor Marker   Patient's tumor was tested for the following markers: CA125 Results of the tumor marker test revealed 29.9   08/21/2015 Tumor Marker   Patient's tumor was tested for the following markers: CA125 Results of the tumor marker test revealed 45   09/18/2015 Tumor Marker   Patient's tumor was tested for the following markers: CA125 Results of the tumor marker test revealed 47   09/27/2015 Imaging   New small bowel mesenteric nodule and enlarged left external iliac lymph node, highly worrisome for metastatic disease. 2. Cirrhosis and splenomegaly   10/10/2015 Tumor Marker   Patient's tumor was tested for the following markers: CA125 Results of the tumor marker test revealed 53.4   10/10/2015 - 12/11/2015 Chemotherapy   She received 4 cycles of carboplatin only   11/20/2015 Tumor Marker   Patient's tumor was tested for the following markers: CA125 Results of the tumor marker test revealed 41.3   12/20/2015 Imaging   CT: Mild left external iliac lymphadenopathy is slightly decreased. Mildly enlarged lower mesenteric nodule is slightly decreased. 2. No new or progressive metastatic disease in the abdomen or pelvis. 3. Cirrhosis. Stable subcentimeter hypodense left liver lobe lesion. No new liver lesions. 4. Stable mild splenomegaly.  No ascites. 5. Mild sigmoid diverticulosis.   01/01/2016 -  Anti-estrogen oral therapy   She was placed on tamoxifen. Had letrozole initially but switched to tamoxifen due to poor tolerance   01/15/2016 Tumor Marker   Patient's tumor was tested for the following markers: CA125 Results of the  tumor marker test revealed 31.4   02/19/2016 Tumor Marker   Patient's tumor was tested for the following markers: CA125 Results of the tumor marker test revealed 36.5   05/13/2016 Imaging   No evidence of disease progression within the abdomen or pelvis. Previously noted small central mesenteric nodule and left external iliac node appear slightly smaller. 2. Stable changes of hepatic cirrhosis and portal hypertension with associated splenomegaly. No new or enlarging hepatic lesions are identified. 3. No acute findings.   05/13/2016 Tumor Marker   Patient's tumor was tested for the following markers: CA125 Results of the tumor marker test revealed 41.2   06/24/2016 Tumor Marker   Patient's tumor was tested for the following markers: CA125 Results of the tumor marker test revealed 47.3   08/20/2016 Imaging   1. The left external iliac lymph node is slightly increased in size compared to the prior exam, currently 1.1 cm and previously 0.9 cm in diameter. 2. Stable central mesenteric lymph node at 0.9 cm in diameter. 3. Stable trace free pelvic fluid and stable slight thickening along the right paracolic gutter, without well-defined peritoneal nodularity. 4. Other imaging findings of  potential clinical significance: Subsegmental atelectasis or scarring in the lung bases. Hepatic cirrhosis. Left renal scarring. Mild splenomegaly. Sigmoid colon diverticulosis. Impingement at L4-5 due to spondylosis and degenerative disc disease broad Schmorl' s nodes at L3-L4. Pelvic floor laxity   08/23/2016 Tumor Marker   Patient's tumor was tested for the following markers: CA125 Results of the tumor marker test revealed 80.2   09/04/2016 Imaging   LV EF: 60% -   65%   09/12/2016 Tumor Marker   Patient's tumor was tested for the following markers: CA125 Results of the tumor marker test revealed 98.5   09/12/2016 - 01/29/2018 Chemotherapy   The patient received Doxil and Avastin. Avastin was discontinued in Dec 2019  due to GI hemorrhage   09/26/2016 Tumor Marker   Patient's tumor was tested for the following markers: CA125 Results of the tumor marker test revealed 68.9   10/10/2016 Tumor Marker   Patient's tumor was tested for the following markers: CA125 Results of the tumor marker test revealed 65.6   11/07/2016 Tumor Marker   Patient's tumor was tested for the following markers: CA125 Results of the tumor marker test revealed 46.4   11/29/2016 Imaging   Stable mild peritoneal thickening, suspicious for peritoneal carcinomatosis. No new or progressive disease identified. No evidence of ascites.  No significant change in 10 mm left external iliac and 8 mm mesenteric lymph nodes.  Stable hepatic cirrhosis, and splenomegaly consistent with portal venous hypertension.  Colonic diverticulosis, without radiographic evidence of diverticulitis.   12/02/2016 Imaging   Normal LV size with EF 55%. Basal inferior and basal inferoseptal hypokinesis. Normal RV size and systolic function. No significant valvular abnormalities.   12/05/2016 Tumor Marker   Patient's tumor was tested for the following markers: CA125 Results of the tumor marker test revealed 49.1   01/09/2017 Tumor Marker   Patient's tumor was tested for the following markers: CA125 Results of the tumor marker test revealed 47.2   02/06/2017 Tumor Marker   Patient's tumor was tested for the following markers: CA125 Results of the tumor marker test revealed 44.1   02/18/2017 Imaging   Stable mild peritoneal thickening. No new or progressive disease identified within the abdomen or pelvis.  Stable tiny sub-cm left external iliac and mesenteric lymph nodes.  Stable hepatic cirrhosis, and splenomegaly consistent with portal venous hypertension. No evidence of hepatic neoplasm.  Colonic diverticulosis, without radiographic evidence of diverticulitis.   03/06/2017 Tumor Marker   Patient's tumor was tested for the following markers:  CA125 Results of the tumor marker test revealed 50.4   03/17/2017 Imaging   - Left ventricle: The cavity size was normal. Wall thickness was normal. Systolic function was normal. The estimated ejection fraction was in the range of 55% to 60%. Wall motion was normal; there were no regional wall motion abnormalities. Features are consistent with a pseudonormal left ventricular filling pattern, with concomitant abnormal relaxation and increased filling pressure (grade 2 diastolic dysfunction). - Mitral valve: There was mild regurgitation. - Left atrium: The atrium was mildly dilated   04/03/2017 Tumor Marker   Patient's tumor was tested for the following markers: CA125 Results of the tumor marker test revealed 47.2   05/14/2017 Imaging   CT scan of abdomen and pelvis Stable mild peritoneal thickening. No new or progressive disease within the abdomen or pelvis.  Stable hepatic cirrhosis. Stable splenomegaly, consistent with portal venous hypertension. No evidence of hepatic neoplasm.   05/15/2017 Tumor Marker   Patient's tumor was tested for the following  markers: CA125 Results of the tumor marker test revealed 56.1   06/12/2017 Tumor Marker   Patient's tumor was tested for the following markers: CA125 Results of the tumor marker test revealed 49.2   07/10/2017 Tumor Marker   Patient's tumor was tested for the following markers: CA125 Results of the tumor marker test revealed 46.3   08/07/2017 Tumor Marker   Patient's tumor was tested for the following markers: CA125 Results of the tumor marker test revealed 52.9   08/20/2017 Imaging   1. Mild omental/peritoneal haziness, unchanged. No evidence of new metastatic disease. 2. Cirrhosis with splenomegaly.   09/04/2017 Tumor Marker   Patient's tumor was tested for the following markers: CA125 Results of the tumor marker test revealed 48.5   11/13/2017 Tumor Marker   Patient's tumor was tested for the following markers: CA125 Results of the  tumor marker test revealed 55.8   12/17/2017 Imaging   1. No findings to suggest metastatic disease in the abdomen or pelvis. 2. Severe hepatic cirrhosis with evidence of portal hypertension, as demonstrated by dilated portal vein, splenomegaly and portosystemic collateral pathways, including gastric and esophageal varices.  3. Colonic diverticulosis without evidence of acute diverticulitis at this time. 4. Additional incidental findings, as above.   01/16/2018 Tumor Marker   Patient's tumor was tested for the following markers: CA125 Results of the tumor marker test revealed 63.7   02/10/2018 Procedure   EGD - Recently bleeding grade III and large (> 5 mm) esophageal varices. Completely eradicated. Banded. - Portal hypertensive gastropathy. - Normal examined duodenum. - No specimens collected   02/13/2018 Tumor Marker   Patient's tumor was tested for the following markers: CA125 Results of the tumor marker test revealed 82.7   02/25/2018 Imaging   Bone density showed mild osteopenia   04/08/2018 Tumor Marker   Patient's tumor was tested for the following markers: CA125 Results of the tumor marker test revealed 82.7   04/08/2018 Imaging   1. No definite omental or peritoneal surface lesions. However, there are 2 slowly enlarging lymph nodes noted. One is in the small bowel mesentery and the other is in the left deep pelvis. Could not exclude recurrent disease. 2. Stable advanced cirrhotic changes involving the liver with portal venous hypertension, portal venous collaterals, esophageal varices and splenomegaly. No worrisome hepatic lesions. 3. Diffuse wall thickening of the colon could suggest diffuse colitis or could be due to low albumin. Recommend correlation with any symptoms such as diarrhea.   05/21/2018 Tumor Marker   Patient's tumor was tested for the following markers: CA125 Results of the tumor marker test revealed 86   09/07/2018 Tumor Marker   Patient's tumor was tested  for the following markers:CA-125 Results of the tumor marker test revealed 111   09/29/2018 Tumor Marker   Patient's tumor was tested for the following markers: CA-125 Results of the tumor marker test revealed 121.   09/29/2018 - 11/24/2018 Chemotherapy   The patient had carboplatin and gemzar for chemotherapy treatment.  Chemo was stopped due to allergic reaction to carboplatin   10/27/2018 Tumor Marker   Patient's tumor was tested for the following markers: CA-125 Results of the tumor marker test revealed 99.9   11/24/2018 Tumor Marker   Patient's tumor was tested for the following markers: CA-125 Results of the tumor marker test revealed 81.2   12/14/2018 Tumor Marker   Patient's tumor was tested for the following markers: CA-125 Results of the tumor marker test revealed 81.2   12/14/2018 Imaging  1. Mild improvement in peritoneal disease. Peritoneal nodule within the left posterior pelvis contiguous with the left side of vaginal cuff is slightly decreased in size in the interval. Within the right iliac fossa there is a small peritoneal nodule which has decreased in size in the interval. Central small bowel mesenteric lymph node is not significantly changed. Slight decrease in size left periaortic lymph node.    2. Morphologic features of the liver compatible with cirrhosis. Splenomegaly is noted which may reflect portal venous hypertension.   12/21/2018 Tumor Marker   Patient's tumor was tested for the following markers: CA-125 Results of the tumor marker test revealed 64.2   12/21/2018 -  Chemotherapy   The patient had taxotere for chemotherapy treatment.     03/02/2019 Tumor Marker   Patient's tumor was tested for the following markers: CA-125. 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   No new or progressive metastatic disease within the abdomen or pelvis.   Hepatic  cirrhosis and findings of portal venous hypertension. 1.1 cm low-attenuation lesion within left hepatic lobe, which could represent a regenerative nodule or small hepatocellular carcinoma. Recommend abdomen MRI without and with contrast for further characterization.     04/13/2019 Tumor Marker   Patient's tumor was tested for the following markers: CA-125 Results of the tumor marker test revealed 36.8   05/26/2019 Tumor Marker   Patient's tumor was tested for the following markers: CA-125 Results of the tumor marker test revealed 31.6.   06/15/2019 Tumor Marker   Patient's tumor was tested for the following markers: CA-125 Results of the tumor marker test revealed 25.4   07/05/2019 Imaging   1. Stable CT examination. Stable peritoneal thickening in the pelvis and paracolic gutters. Stable solid left deep pelvic implant. Stable trace ascites. No new or progressive metastatic disease. 2. Stable cirrhosis. No discrete liver masses. 3. Stable mild-to-moderate splenomegaly. 4. Aortic Atherosclerosis (ICD10-I70.0).   07/27/2019 Tumor Marker   Patient's tumor was tested for the following markers: CA-125 Results of the tumor marker test revealed 35.4   08/17/2019 Tumor Marker   Patient's tumor was tested for the following markers: Ca-125 Results of the tumor marker test revealed 35.7.   09/17/2019 - 09/21/2019 Hospital Admission   She was admitted due to esophageal variceal bleeding presents with hematemesis and melanotic stool with epigastric pain.  GI consulted, started on octreotide infusion.  Underwent EGD which showed minimal bleeding, remain on octreotide drip, PPI drip.  Hemoglobin trended down therefore received PRBC transfusion.  Over several days her bleeding subsided, hemodynamically remained stable.    09/17/2019 Procedure   EGD report - Esophageal mucosal scarring, from prior esophageal banding sessions. - Grade I esophageal varices. - Red blood in the cardia and in the gastric fundus. -  Portal hypertensive gastropathy. - Normal duodenal bulb, first portion of the duodenum and second portion of the duodenum. 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 2. Otherwise stable CT exam. Stable peritoneal thickening within the bilateral deep pelvic peritoneum. Stable left deep pelvic implant.No evidence for progressed disease. 3. Stable splenomegaly. 4. Morphologic  changes to the liver compatible with cirrhosis. Trace perihepatic and pelvic ascites.   02/22/2020 Imaging   1. New lucent lesion in the left side of the L4 vertebral body measuring 1 cm, concerning for possible osseous metastasis. Consideration for further evaluation with bone scan is suggested. Close attention on follow-up imaging is also recommended to ensure stability. 2. Previously noted peritoneal thickening and nodularity in the low anatomic pelvis has regressed slightly. 3. Borderline enlarged and mildly enlarged pelvic and retroperitoneal lymph nodes appear similar to slightly increased compared to the prior examination, as above. Continued attention on follow-up studies is recommended.  4. 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 5. Aortic atherosclerosis. 6. Additional incidental findings, as above.   02/22/2020 Tumor Marker   Patient's tumor was tested for the following markers: CA-125 Results of the tumor marker test revealed 30.1   04/06/2020 Tumor Marker   Patient's tumor was tested for the following markers: CA-125 Results of the tumor marker test  revealed 35.7   09/11/2020 Tumor Marker   Patient's tumor was tested for the following markers: CA-125. Results of the tumor marker test revealed 26.6.   09/12/2020 Imaging   1. Slow enlargement of a currently 2.9 cubic cm soft tissue nodule along the left adnexa. Tumor deposit not excluded. 2. The left external iliac lymph node has substantially reduced in size, currently 0.9 cm in short axis and formerly 1.4 cm. 3. 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 4. Cirrhosis with portal venous hypertension. 5. Mild distal esophageal wall thickening, probably from esophagitis. Splenomegaly. 6. Scarring in the left kidney. 7. Sigmoid colon diverticulosis. 8. Pelvic floor laxity with small cystocele. 9. Lumbar impingement at L2-3, L3-4, and L4-5.     10/23/2020 Tumor Marker   Patient's tumor was tested for the following markers: CA-125. Results of the tumor marker test revealed 35.3.   12/12/2020 Tumor Marker   Patient's tumor was tested for the following markers: CA-125. Results of the tumor marker test revealed 39.1.   12/13/2020 Imaging   1. Continued increase in size of soft tissue nodule within the left posterior pelvis adjacent to the sigmoid colon. Cannot exclude small tumor deposit. 2. Similar appearance of thickening and mild nodularity along the peritoneal reflections of the pelvis and pericolic gutters. 3. Morphologic findings of the liver compatible with cirrhosis. 4. Splenomegaly. 5. Aortic Atherosclerosis (ICD10-I70.0).   01/24/2021 Tumor Marker   Patient's tumor was tested for the following markers: CA-125. Results of the tumor marker test revealed 56.2.   02/09/2021 Imaging   1. Mild interval increased size of the solid left adnexal mass which may represent tumor recurrence/deposit. The mass abuts the adjacent sigmoid colon as well as the left posterior wall  of the urinary bladder, invasion not excluded. 2. Otherwise no new mass or lymphadenopathy identified in the abdomen or pelvis. 3. Hepatic cirrhosis. Splenomegaly consistent with portal hypertension.   04/11/2021 Imaging   1. Left posterior pelvic soft tissue mass, adjacent to the sigmoid colon, has slightly increased in size in comparison to recent prior with significant interval increase in size over more remote priors, findings which are highly suspicious for active neoplastic disease involvement. 2. No new discrete omental or peritoneal nodules/masses identified. 3. Similar nodular thickening of the peritoneum along the bilateral pericolic gutters and in the pelvis without ascites. 4. No pathologically enlarged abdominal or pelvic lymph nodes and no  solid organ metastases visualized. 5. Cirrhotic hepatic morphology with evidence of portal hypertension including splenomegaly. 6. Sigmoid colonic diverticulosis without findings of acute diverticulitis. 7. Moderate volume of formed stool throughout the colon. 8. Aortic Atherosclerosis (ICD10-I70.0).   04/11/2021 Tumor Marker   Patient's tumor was tested for the following markers: CA-125. Results of the tumor marker test revealed 79.4 .   04/23/2021 - 10/09/2021 Chemotherapy   Patient is on Treatment Plan : Ovarian Docetaxel q21d     04/24/2021 Tumor Marker   Patient's tumor was tested for the following markers: CA-125. Results of the tumor marker test revealed 68.5.   05/16/2021 Tumor Marker   Patient's tumor was tested for the following markers: CA-125. Results of the tumor marker test revealed 68.1.   06/28/2021 Tumor Marker   Patient's tumor was tested for the following markers: CA-125. Results of the tumor marker test revealed 52.   07/16/2021 Imaging   1. Stable cirrhotic changes involving the liver. Associated portal venous hypertension, portal venous collaterals and splenomegaly. No hepatic lesions. 2. Slight interval decrease in size  of the left pelvic implant. 3. Stable minimal peritoneal nodularity involving the pericolic gutters bilaterally. No new or progressive findings.     08/09/2021 Tumor Marker   Patient's tumor was tested for the following markers: CA-125. Results of the tumor marker test revealed 44.1.   09/20/2021 Tumor Marker   Patient's tumor was tested for the following markers: CA-125. Results of the tumor marker test revealed 38.2.   10/31/2021 Imaging   1. Stable exam. No new or progressive interval findings. 2. Stable appearance of the soft tissue nodule in the left pelvis. 3. Subtle omental nodule is similar to prior. The minimal peritoneal nodularity described previously in the para colic gutters is similar to prior. 4. Cirrhosis with splenomegaly. 5. Cholelithiasis. 6. Left colonic diverticulosis without diverticulitis. 7. Aortic Atherosclerosis (ICD10-I70.0).   10/31/2021 Tumor Marker   Patient's tumor was tested for the following markers: Ca-125. Results of the tumor marker test revealed 37.7.   11/01/2021 - 11/01/2021 Chemotherapy   Patient is on Treatment Plan : BREAST Docetaxel (77) q21d     11/18/2021 -  Chemotherapy   She started on Niraparib   12/03/2021 Tumor Marker   Patient's tumor was tested for the following markers: CA-125. Results of the tumor marker test revealed 45.5.     PHYSICAL EXAMINATION: ECOG PERFORMANCE STATUS: 0 - Asymptomatic  Vitals:   12/25/21 0905  BP: (!) 141/72  Pulse: 72  Resp: 18  Temp: 98 F (36.7 C)  SpO2: 98%   Filed Weights   12/25/21 0905  Weight: 178 lb (80.7 kg)    GENERAL:alert, no distress and comfortable NEURO: alert & oriented x 3 with fluent speech, no focal motor/sensory deficits  LABORATORY DATA:  I have reviewed the data as listed    Component Value Date/Time   NA 142 12/13/2021 0746   NA 142 02/06/2017 0742   K 4.3 12/13/2021 0746   K 3.9 02/06/2017 0742   CL 107 12/13/2021 0746   CO2 29 12/13/2021 0746   CO2 22  02/06/2017 0742   GLUCOSE 98 12/13/2021 0746   GLUCOSE 156 (H) 02/06/2017 0742   BUN 17 12/13/2021 0746   BUN 13.5 02/06/2017 0742   CREATININE 1.03 (H) 12/13/2021 0746   CREATININE 0.87 10/30/2021 0732   CREATININE 0.8 02/06/2017 0742   CALCIUM 8.7 (L) 12/13/2021 0746   CALCIUM 8.5 02/06/2017 0742   PROT 7.4 12/13/2021 0746   PROT 6.9  02/06/2017 0742   ALBUMIN 4.0 12/13/2021 0746   ALBUMIN 3.7 02/06/2017 0742   AST 22 12/13/2021 0746   AST 19 10/30/2021 0732   AST 47 (H) 02/06/2017 0742   ALT 19 12/13/2021 0746   ALT 17 10/30/2021 0732   ALT 54 02/06/2017 0742   ALKPHOS 107 12/13/2021 0746   ALKPHOS 130 02/06/2017 0742   BILITOT 0.5 12/13/2021 0746   BILITOT 0.6 10/30/2021 0732   BILITOT 0.65 02/06/2017 0742   GFRNONAA 57 (L) 12/13/2021 0746   GFRNONAA >60 10/30/2021 0732   GFRAA >60 11/09/2019 0953   GFRAA >60 10/26/2019 0722    No results found for: "SPEP", "UPEP"  Lab Results  Component Value Date   WBC 3.5 (L) 12/25/2021   NEUTROABS 2.4 12/25/2021   HGB 12.9 12/25/2021   HCT 38.3 12/25/2021   MCV 94.3 12/25/2021   PLT 86 (L) 12/25/2021      Chemistry      Component Value Date/Time   NA 142 12/13/2021 0746   NA 142 02/06/2017 0742   K 4.3 12/13/2021 0746   K 3.9 02/06/2017 0742   CL 107 12/13/2021 0746   CO2 29 12/13/2021 0746   CO2 22 02/06/2017 0742   BUN 17 12/13/2021 0746   BUN 13.5 02/06/2017 0742   CREATININE 1.03 (H) 12/13/2021 0746   CREATININE 0.87 10/30/2021 0732   CREATININE 0.8 02/06/2017 0742      Component Value Date/Time   CALCIUM 8.7 (L) 12/13/2021 0746   CALCIUM 8.5 02/06/2017 0742   ALKPHOS 107 12/13/2021 0746   ALKPHOS 130 02/06/2017 0742   AST 22 12/13/2021 0746   AST 19 10/30/2021 0732   AST 47 (H) 02/06/2017 0742   ALT 19 12/13/2021 0746   ALT 17 10/30/2021 0732   ALT 54 02/06/2017 0742   BILITOT 0.5 12/13/2021 0746   BILITOT 0.6 10/30/2021 0732   BILITOT 0.65 02/06/2017 4259

## 2021-12-26 ENCOUNTER — Other Ambulatory Visit (HOSPITAL_COMMUNITY): Payer: Self-pay

## 2021-12-26 LAB — CA 125: Cancer Antigen (CA) 125: 48.3 U/mL — ABNORMAL HIGH (ref 0.0–38.1)

## 2022-01-06 ENCOUNTER — Other Ambulatory Visit: Payer: Self-pay | Admitting: Hematology and Oncology

## 2022-01-16 ENCOUNTER — Other Ambulatory Visit (HOSPITAL_COMMUNITY): Payer: Self-pay

## 2022-01-21 ENCOUNTER — Other Ambulatory Visit: Payer: Self-pay | Admitting: Hematology and Oncology

## 2022-01-21 ENCOUNTER — Telehealth: Payer: Self-pay

## 2022-01-21 MED ORDER — HYDROCODONE-ACETAMINOPHEN 5-325 MG PO TABS: 1.0000 | ORAL_TABLET | Freq: Four times a day (QID) | ORAL | 0 refills | Status: DC | PRN

## 2022-01-21 NOTE — Telephone Encounter (Signed)
Called and told Rx sent to pharmacy. She verbalized understanding.

## 2022-01-21 NOTE — Telephone Encounter (Signed)
She called and left a message asking for a hydrocodone refill to pharmacy, please.

## 2022-01-21 NOTE — Telephone Encounter (Signed)
Sent to [facility]

## 2022-01-23 ENCOUNTER — Other Ambulatory Visit: Payer: Self-pay

## 2022-01-23 ENCOUNTER — Other Ambulatory Visit (HOSPITAL_COMMUNITY): Payer: Self-pay

## 2022-01-25 ENCOUNTER — Other Ambulatory Visit: Payer: Self-pay

## 2022-01-29 ENCOUNTER — Inpatient Hospital Stay: Payer: Medicare Other | Attending: Hematology and Oncology

## 2022-01-29 ENCOUNTER — Inpatient Hospital Stay (HOSPITAL_BASED_OUTPATIENT_CLINIC_OR_DEPARTMENT_OTHER): Payer: Medicare Other | Admitting: Hematology and Oncology

## 2022-01-29 ENCOUNTER — Encounter: Payer: Self-pay | Admitting: Hematology and Oncology

## 2022-01-29 VITALS — BP 160/76 | HR 78 | Temp 97.5°F | Resp 18 | Ht 62.0 in | Wt 175.0 lb

## 2022-01-29 DIAGNOSIS — R161 Splenomegaly, not elsewhere classified: Secondary | ICD-10-CM | POA: Diagnosis not present

## 2022-01-29 DIAGNOSIS — C482 Malignant neoplasm of peritoneum, unspecified: Secondary | ICD-10-CM | POA: Diagnosis not present

## 2022-01-29 DIAGNOSIS — D61818 Other pancytopenia: Secondary | ICD-10-CM | POA: Diagnosis not present

## 2022-01-29 DIAGNOSIS — Z5111 Encounter for antineoplastic chemotherapy: Secondary | ICD-10-CM

## 2022-01-29 LAB — CMP (CANCER CENTER ONLY)
ALT: 25 U/L (ref 0–44)
AST: 30 U/L (ref 15–41)
Albumin: 3.8 g/dL (ref 3.5–5.0)
Alkaline Phosphatase: 92 U/L (ref 38–126)
Anion gap: 13 (ref 5–15)
BUN: 17 mg/dL (ref 8–23)
CO2: 20 mmol/L — ABNORMAL LOW (ref 22–32)
Calcium: 8.6 mg/dL — ABNORMAL LOW (ref 8.9–10.3)
Chloride: 110 mmol/L (ref 98–111)
Creatinine: 1.02 mg/dL — ABNORMAL HIGH (ref 0.44–1.00)
GFR, Estimated: 58 mL/min — ABNORMAL LOW (ref >60)
Glucose, Bld: 87 mg/dL (ref 70–99)
Potassium: 4.4 mmol/L (ref 3.5–5.1)
Sodium: 143 mmol/L (ref 135–145)
Total Bilirubin: 0.8 mg/dL (ref 0.3–1.2)
Total Protein: 7.3 g/dL (ref 6.5–8.1)

## 2022-01-29 LAB — CBC WITH DIFFERENTIAL/PLATELET
Abs Immature Granulocytes: 0.01 10*3/uL (ref 0.00–0.07)
Basophils Absolute: 0 10*3/uL (ref 0.0–0.1)
Basophils Relative: 0 %
Eosinophils Absolute: 0.1 10*3/uL (ref 0.0–0.5)
Eosinophils Relative: 3 %
HCT: 37.9 % (ref 36.0–46.0)
Hemoglobin: 12.8 g/dL (ref 12.0–15.0)
Immature Granulocytes: 0 %
Lymphocytes Relative: 25 %
Lymphs Abs: 0.8 10*3/uL (ref 0.7–4.0)
MCH: 31.1 pg (ref 26.0–34.0)
MCHC: 33.8 g/dL (ref 30.0–36.0)
MCV: 92.2 fL (ref 80.0–100.0)
Monocytes Absolute: 0.2 10*3/uL (ref 0.1–1.0)
Monocytes Relative: 6 %
Neutro Abs: 2.2 10*3/uL (ref 1.7–7.7)
Neutrophils Relative %: 66 %
Platelets: 87 10*3/uL — ABNORMAL LOW (ref 150–400)
RBC: 4.11 MIL/uL (ref 3.87–5.11)
RDW: 14.4 % (ref 11.5–15.5)
WBC: 3.4 10*3/uL — ABNORMAL LOW (ref 4.0–10.5)
nRBC: 0 % (ref 0.0–0.2)

## 2022-01-29 MED ORDER — HEPARIN SOD (PORK) LOCK FLUSH 100 UNIT/ML IV SOLN
500.0000 [IU] | Freq: Once | INTRAVENOUS | Status: AC
  Administered 2022-01-29: 500 [IU]

## 2022-01-29 MED ORDER — SODIUM CHLORIDE 0.9% FLUSH
10.0000 mL | Freq: Once | INTRAVENOUS | Status: AC
  Administered 2022-01-29: 10 mL

## 2022-01-29 NOTE — Assessment & Plan Note (Signed)
Overall, she tolerated treatment well apart from persistent pancytopenia Overall, her pancytopenia is stable She will continue reduced dose niraparib daily I plan to see her again in a month for further follow-up will repeat imaging study

## 2022-01-29 NOTE — Progress Notes (Signed)
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    Answer:   [facility]    Order Specific Question:   Radiology Contrast Protocol - do NOT remove file path    Answer:   [email or web address]    All questions were answered. The patient knows to call the clinic with any problems, questions or concerns. The total time spent in the appointment was 20 minutes encounter with patients including review of chart and various tests results, discussions about plan of care and coordination of care plan   [name], MD 01/29/2022 8:28 AM  INTERVAL HISTORY: Please see below for problem oriented charting. she returns for treatment follow-up on maintenance niraparib She tolerated treatment well Denies side effects such as bleeding No recent nausea  REVIEW OF SYSTEMS:    Constitutional: Denies fevers, chills or abnormal weight loss Eyes: Denies blurriness of vision Ears, nose, mouth, throat, and face: Denies mucositis or sore throat Respiratory: Denies cough, dyspnea or wheezes Cardiovascular: Denies palpitation, chest discomfort or lower extremity swelling Gastrointestinal:  Denies nausea, heartburn or change in bowel habits Skin: Denies abnormal skin rashes Lymphatics: Denies new lymphadenopathy or easy bruising Neurological:Denies numbness, tingling or new weaknesses Behavioral/Psych: Mood is stable, no new changes  All other systems were reviewed with the patient and are negative.  I have reviewed the past medical history, past surgical history, social history and family history with the patient and they are unchanged from previous note.  ALLERGIES:  is allergic to carboplatin, shellfish allergy, vancomycin, benadryl [diphenhydramine hcl], penicillins, and tape.  MEDICATIONS:  Current Outpatient Medications  Medication Sig Dispense Refill   calcium elemental as carbonate (BARIATRIC TUMS ULTRA) 400 MG chewable tablet Chew 1 tablet by mouth 2 (two) times daily.     Cholecalciferol (VITAMIN D) 50 MCG (2000 UT) tablet Take 2,000 Units by mouth at bedtime.     gabapentin (NEURONTIN) 300 MG capsule TAKE 3 CAPSULES BY MOUTH TWICE DAILY 120 capsule 11   HYDROcodone-acetaminophen (NORCO/VICODIN) 5-325 MG tablet Take 1 tablet by mouth every 6 (six) hours as needed for moderate pain. 60 tablet 0   insulin degludec (TRESIBA FLEXTOUCH) 100 UNIT/ML FlexTouch Pen Inject 28 Units into the skin daily.     lidocaine-prilocaine (EMLA) cream Apply to Porta-cath  1-2 hours prior to access as directed. (Patient taking differently: Apply 1 application  topically daily as needed (port access).) 30  g 9   niraparib tosylate (ZEJULA) 100 MG tablet Take 1 tablet (100 mg total) by mouth daily. 30 tablet 9   ondansetron (ZOFRAN) 8 MG tablet TAKE 1 TABLET(8 MG) BY MOUTH EVERY 8  HOURS AS NEEDED 60 tablet 1   OZEMPIC, 0.25 OR 0.5 MG/DOSE, 2 MG/1.5ML SOPN Inject 0.25 mg into the skin every Saturday.     pantoprazole (PROTONIX) 40 MG tablet Take 1 tablet (40 mg total) by mouth daily.     rotigotine (NEUPRO) 4 MG/24HR APPLY 1 PATCH TOPICALLY TO THE SKIN DAILY 90 patch 0   No current facility-administered medications for this visit.    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 Had allergic reaction to carboplatin. Avastin was stopped due to GI hemorrhage   Primary peritoneal carcinomatosis ([place])  09/06/2011 Imaging   CT findings consistent with cirrhotic changes involving the liver.  No worrisome liver mass.  There are associated portal venous collaterals and splenomegaly consistent with portal venous hypertension. 2.  No other significant upper abdominal findings   11/08/2013 Imaging   US showed ascites   11/08/2013 Initial Diagnosis   Patient had ~ 2 months of some urinary incontinence, saw Dr Ronita Hipps in 10-2013, with pelvic mass on exam    11/12/2013 Imaging   There are findings of progressive hepatic cirrhosis with a large amount of ascites and mild splenomegaly. There are venous collaterals present. 2. There is abnormal thickening of the peritoneal surface along the left mid and lower abdominal wall. There is abnormal soft tissue density in the pelvis as well which could reflect the clinically suspected ovarian malignancy but the findings are nondiagnostic. 3. There is a new small left pleural effusion. 4. There is no acute bowel abnormality.   11/18/2013 Imaging   Successful ultrasound guided diagnostic and therapeutic paracentesis yielding 4.1 liters of ascites.     11/18/2013 Pathology Results   PERITONEAL/ASCITIC FLUID (SPECIMEN 1 OF 1 COLLECTED 11/18/13): SEROUS CARCINOMA, PLEASE SEE COMMENT.   12/01/2013 Tumor Marker   Patient's tumor was tested for the  following markers: CA125 Results of the tumor marker test revealed 1938   12/03/2013 Imaging   Successful ultrasound-guided therapeutic paracentesis yielding 3.6 liters of peritoneal fluid.   12/07/2013 - 05/30/2014 Chemotherapy   She received 3 cycles of carboplatin and Taxol, interrupted cycle 4 for surgery.  She subsequently completed 3 more cycles of chemotherapy after surgery   12/20/2013 Imaging   Successful ultrasound-guided diagnostic and therapeutic paracentesis yielding 2.2 liters of peritoneal fluid   12/20/2013 Pathology Results   PERITONEAL/ASCITIC FLUID(SPECIMEN 1 OF 1 COLLECTED 12/20/13): MALIGNANT CELLS CONSISTENT WITH SEROUS CARCINOMA   01/13/2014 Tumor Marker   Patient's tumor was tested for the following markers: CA125 Results of the tumor marker test revealed 227   02/07/2014 Tumor Marker   Patient's tumor was tested for the following markers: CA125 Results of the tumor marker test revealed 130   02/15/2014 Genetic Testing   Genetics testing from 02-2014 normal ([employer]va[employer] panel)   02/23/2014 Imaging   Interval decrease in ascites. 2. Morphologic changes in the liver consistent with cirrhosis. 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 Portal vein remains patent at this time. 3. Persistent but decreased abnormal soft tissue attenuation tracking in the omental fat. This may be secondary to interval improvement in metastatic disease. 4. Peritoneal thickening seen in the left abdomen and pelvis on the previous study has decreased and nearly resolved  in the interval. This also suggests interval improvement in metastatic disease.     03/07/2014 Procedure   Ultrasound and fluoroscopically guided right internal jugular single lumen power port catheter insertion. Tip in the SVC/RA junction. Catheter ready for use.   03/17/2014 Tumor Marker   Patient's tumor was tested for the following markers: CA125 Results of the tumor marker test revealed 45    03/29/2014 Pathology Results   1. Omentum, resection for tumor - HIGH GRADE SEROUS CARCINOMA, SEE COMMENT. 2. Ovary and fallopian tube, right - HIGH GRADE SEROUS CARCINOMA, SEE COMMENT. 3. Ovary and fallopian tube, left - HIGH GRADE SEROUS CARCINOMA, SEE COMMENT. Diagnosis Note 1. Nests and clusters of malignant cells are invading the omental tissue (part #1) with associated fibrosis. The cells are pleomorphic with prominent nucleoli. There are scattered psammoma bodies. The ovaries are atrophic and exhibit multiple foci of surface based invasive carcinoma and associated fibrosis. The fallopian tubes have a few foci of carcinoma, also superficially located. There are no precursor lesions noted in the ovaries or fallopian tubes (the entire tubes and ovaries were submitted for evaluation). While there is some retraction artifact, there are several foci suspicious for lymphovascular invasion. 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 Given the fibrosis, there does appear to be a small amount of treatment effect, however, there is abundant residual tumor.    03/29/2014 Surgery   Procedure(s) Performed: 1. Exploratory laparotomy with bilateral salpingo-oophorectomy, omentectomy radical tumor debulking for ovarian cancer .   Surgeon: Thereasa Solo, MD.   Assistant Surgeon: Lahoma Crocker, M.D. Assistant: (an MD assistant was necessary for tissue manipulation, retraction and positioning due to the complexity of the case and hospital policies).  Operative Findings: 10cm omental cake from hepatic to splenic flexure, densely adherent to transverse colon. Milliary studding of tumor implants (<101m, too numerous in number to count) adherent to the mesentery of the small bowel and small bowel wall. Small volume (100cc) ascites. Small ovaries bilaterally, densely adherent to  the pelvic cul de sac. Left ovary and tube densely adherent to sigmoid colon. Cirrhotic liver with hepatomegaly and splenomegaly.     This represented an optimal cytoreduction (R1) with visible disease residual on the bowel wall and mesentery (millial, <339mimplants).      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 2. 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     08/18/2014 Tumor Marker   Patient's tumor was tested for the following markers: CA125 Results of the tumor marker test revealed 16   10/13/2014 Tumor Marker   Patient's tumor was tested for the following markers: CA125 Results of the tumor marker test revealed 14   11/21/2014 Tumor Marker   Patient's tumor was tested for the following markers: CA125 Results of the tumor marker test revealed 16   12/28/2014 Tumor Marker   Patient's tumor was tested for the following markers: CA125 Results  of the tumor marker test revealed 762   02/27/2015 Tumor Marker   Patient's tumor was tested for the following markers: CA125 Results of the tumor marker test revealed 20.2   03/22/2015 Imaging   Filler appearance to the prior exam, with very subtle fluid tracking along portions of the liver, and very minimal nodularity along the right paracolic gutter representing residua from the prior peritoneal cancer. The current abnormalities could simply be post therapy findings rather than necessarily representing residual malignancy. No new or enlarging lesions are  identified. 2. Hepatic cirrhosis and splenomegaly. There is some gastric varices suggesting portal venous hypertension. 3. Left foraminal stenosis at L4-5 due to spurring. There is likely also some central narrowing of the thecal sac at this level. 4. Bibasilar scarring. 5. Chronic mild left mid kidney scarring   03/27/2015 Tumor Marker   Patient's tumor was tested for the following markers: CA125 Results of the tumor marker test revealed 25.3   04/24/2015 Imaging   Disc bulge L2-3 with mild spinal stenosis and right foraminal encroachment. 2. Disc bulge L3-4 with mild spinal stenosis. 3. Multifactorial spinal, left lateral recess and foraminal stenosis L4-5. There is grade 1 anterolisthesis without evident dynamic instability.   05/29/2015 Tumor Marker   Patient's tumor was tested for the following markers: CA125 Results of the tumor marker test revealed 29.9   08/21/2015 Tumor Marker   Patient's tumor was tested for the following markers: CA125 Results of the tumor marker test revealed 45   09/18/2015 Tumor Marker   Patient's tumor was tested for the following markers: CA125 Results of the tumor marker test revealed 47   09/27/2015 Imaging   New small bowel mesenteric nodule and enlarged left external iliac lymph node, highly worrisome for metastatic disease. 2. Cirrhosis and splenomegaly   10/10/2015 Tumor Marker   Patient's tumor was tested for the following markers: CA125 Results of the tumor marker test revealed 53.4   10/10/2015 - 12/11/2015 Chemotherapy   She received 4 cycles of carboplatin only   11/20/2015 Tumor Marker   Patient's tumor was tested for the following markers: CA125 Results of the tumor marker test revealed 41.3   12/20/2015 Imaging   CT: Mild left external iliac lymphadenopathy is slightly decreased. Mildly enlarged lower mesenteric nodule is slightly decreased. 2. No new or progressive metastatic disease in the abdomen or pelvis. 3. Cirrhosis. Stable subcentimeter  hypodense left liver lobe lesion. No new liver lesions. 4. Stable mild splenomegaly.  No ascites. 5. Mild sigmoid diverticulosis.   01/01/2016 -  Anti-estrogen oral therapy   She was placed on tamoxifen. Had letrozole initially but switched to tamoxifen due to poor tolerance   01/15/2016 Tumor Marker   Patient's tumor was tested for the following markers: CA125 Results of the tumor marker test revealed 31.4   02/19/2016 Tumor Marker   Patient's tumor was tested for the following markers: CA125 Results of the tumor marker test revealed 36.5   05/13/2016 Imaging   No evidence of disease progression within the abdomen or pelvis. Previously noted small central mesenteric nodule and left external iliac node appear slightly smaller. 2. Stable changes of hepatic cirrhosis and portal hypertension with associated splenomegaly. No new or enlarging hepatic lesions are identified. 3. No acute findings.   05/13/2016 Tumor Marker   Patient's tumor was tested for the following markers: CA125 Results of the tumor marker test revealed 41.2   06/24/2016 Tumor Marker   Patient's tumor was tested for the following markers: CA125 Results of the  tumor marker test revealed 47.3   08/20/2016 Imaging   1. The left external iliac lymph node is slightly increased in size compared to the prior exam, currently 1.1 cm and previously 0.9 cm in diameter. 2. Stable central mesenteric lymph node at 0.9 cm in diameter. 3. Stable trace free pelvic fluid and stable slight thickening along the right paracolic gutter, without well-defined peritoneal nodularity. 4. Other imaging findings of potential clinical significance: Subsegmental atelectasis or scarring in the lung bases. Hepatic cirrhosis. Left renal scarring. Mild splenomegaly. Sigmoid colon diverticulosis. Impingement at L4-5 due to spondylosis and degenerative disc disease broad Schmorl' s nodes at L3-L4. Pelvic floor laxity   08/23/2016 Tumor Marker   Patient's tumor was  tested for the following markers: CA125 Results of the tumor marker test revealed 80.2   09/04/2016 Imaging   LV EF: 60% -   65%   09/12/2016 Tumor Marker   Patient's tumor was tested for the following markers: CA125 Results of the tumor marker test revealed 98.5   09/12/2016 - 01/29/2018 Chemotherapy   The patient received Doxil and Avastin. Avastin was discontinued in Dec 2019 due to GI hemorrhage   09/26/2016 Tumor Marker   Patient's tumor was tested for the following markers: CA125 Results of the tumor marker test revealed 68.9   10/10/2016 Tumor Marker   Patient's tumor was tested for the following markers: CA125 Results of the tumor marker test revealed 65.6   11/07/2016 Tumor Marker   Patient's tumor was tested for the following markers: CA125 Results of the tumor marker test revealed 46.4   11/29/2016 Imaging   Stable mild peritoneal thickening, suspicious for peritoneal carcinomatosis. No new or progressive disease identified. No evidence of ascites.  No significant change in 10 mm left external iliac and 8 mm mesenteric lymph nodes.  Stable hepatic cirrhosis, and splenomegaly consistent with portal venous hypertension.  Colonic diverticulosis, without radiographic evidence of diverticulitis.   12/02/2016 Imaging   Normal LV size with EF 55%. Basal inferior and basal inferoseptal hypokinesis. Normal RV size and systolic function. No significant valvular abnormalities.   12/05/2016 Tumor Marker   Patient's tumor was tested for the following markers: CA125 Results of the tumor marker test revealed 49.1   01/09/2017 Tumor Marker   Patient's tumor was tested for the following markers: CA125 Results of the tumor marker test revealed 47.2   02/06/2017 Tumor Marker   Patient's tumor was tested for the following markers: CA125 Results of the tumor marker test revealed 44.1   02/18/2017 Imaging   Stable mild peritoneal thickening. No new or progressive disease identified  within the abdomen or pelvis.  Stable tiny sub-cm left external iliac and mesenteric lymph nodes.  Stable hepatic cirrhosis, and splenomegaly consistent with portal venous hypertension. No evidence of hepatic neoplasm.  Colonic diverticulosis, without radiographic evidence of diverticulitis.   03/06/2017 Tumor Marker   Patient's tumor was tested for the following markers: CA125 Results of the tumor marker test revealed 50.4   03/17/2017 Imaging   - Left ventricle: The cavity size was normal. Wall thickness was normal. Systolic function was normal. The estimated ejection fraction was in the range of 55% to 60%. Wall motion was normal; there were no regional wall motion abnormalities. Features are consistent with a pseudonormal left ventricular filling pattern, with concomitant abnormal relaxation and increased filling pressure (grade 2 diastolic dysfunction). - Mitral valve: There was mild regurgitation. - Left atrium: The atrium was mildly dilated   04/03/2017 Tumor Marker   Patient's  tumor was tested for the following markers: CA125 Results of the tumor marker test revealed 47.2   05/14/2017 Imaging   CT scan of abdomen and pelvis Stable mild peritoneal thickening. No new or progressive disease within the abdomen or pelvis.  Stable hepatic cirrhosis. Stable splenomegaly, consistent with portal venous hypertension. No evidence of hepatic neoplasm.   05/15/2017 Tumor Marker   Patient's tumor was tested for the following markers: CA125 Results of the tumor marker test revealed 56.1   06/12/2017 Tumor Marker   Patient's tumor was tested for the following markers: CA125 Results of the tumor marker test revealed 49.2   07/10/2017 Tumor Marker   Patient's tumor was tested for the following markers: CA125 Results of the tumor marker test revealed 46.3   08/07/2017 Tumor Marker   Patient's tumor was tested for the following markers: CA125 Results of the tumor marker test revealed 52.9    08/20/2017 Imaging   1. Mild omental/peritoneal haziness, unchanged. No evidence of new metastatic disease. 2. Cirrhosis with splenomegaly.   09/04/2017 Tumor Marker   Patient's tumor was tested for the following markers: CA125 Results of the tumor marker test revealed 48.5   11/13/2017 Tumor Marker   Patient's tumor was tested for the following markers: CA125 Results of the tumor marker test revealed 55.8   12/17/2017 Imaging   1. No findings to suggest metastatic disease in the abdomen or pelvis. 2. Severe hepatic cirrhosis with evidence of portal hypertension, as demonstrated by dilated portal vein, splenomegaly and portosystemic collateral pathways, including gastric and esophageal varices.  3. Colonic diverticulosis without evidence of acute diverticulitis at this time. 4. Additional incidental findings, as above.   01/16/2018 Tumor Marker   Patient's tumor was tested for the following markers: CA125 Results of the tumor marker test revealed 63.7   02/10/2018 Procedure   EGD - Recently bleeding grade III and large (> 5 mm) esophageal varices. Completely eradicated. Banded. - Portal hypertensive gastropathy. - Normal examined duodenum. - No specimens collected   02/13/2018 Tumor Marker   Patient's tumor was tested for the following markers: CA125 Results of the tumor marker test revealed 82.7   02/25/2018 Imaging   Bone density showed mild osteopenia   04/08/2018 Tumor Marker   Patient's tumor was tested for the following markers: CA125 Results of the tumor marker test revealed 82.7   04/08/2018 Imaging   1. No definite omental or peritoneal surface lesions. However, there are 2 slowly enlarging lymph nodes noted. One is in the small bowel mesentery and the other is in the left deep pelvis. Could not exclude recurrent disease. 2. Stable advanced cirrhotic changes involving the liver with portal venous hypertension, portal venous collaterals, esophageal varices and  splenomegaly. No worrisome hepatic lesions. 3. Diffuse wall thickening of the colon could suggest diffuse colitis or could be due to low albumin. Recommend correlation with any symptoms such as diarrhea.   05/21/2018 Tumor Marker   Patient's tumor was tested for the following markers: CA125 Results of the tumor marker test revealed 86   09/07/2018 Tumor Marker   Patient's tumor was tested for the following markers:CA-125 Results of the tumor marker test revealed 111   09/29/2018 Tumor Marker   Patient's tumor was tested for the following markers: CA-125 Results of the tumor marker test revealed 121.   09/29/2018 - 11/24/2018 Chemotherapy   The patient had carboplatin and gemzar for chemotherapy treatment.  Chemo was stopped due to allergic reaction to carboplatin   10/27/2018 Tumor Marker  Patient's tumor was tested for the following markers: CA-125 Results of the tumor marker test revealed 99.9   11/24/2018 Tumor Marker   Patient's tumor was tested for the following markers: CA-125 Results of the tumor marker test revealed 81.2   12/14/2018 Tumor Marker   Patient's tumor was tested for the following markers: CA-125 Results of the tumor marker test revealed 81.2   12/14/2018 Imaging   1. Mild improvement in peritoneal disease. Peritoneal nodule within the left posterior pelvis contiguous with the left side of vaginal cuff is slightly decreased in size in the interval. Within the right iliac fossa there is a small peritoneal nodule which has decreased in size in the interval. Central small bowel mesenteric lymph node is not significantly changed. Slight decrease in size left periaortic lymph node.    2. Morphologic features of the liver compatible with cirrhosis. Splenomegaly is noted which may reflect portal venous hypertension.   12/21/2018 Tumor Marker   Patient's tumor was tested for the following markers: CA-125 Results of the tumor marker test revealed 64.2   12/21/2018 -   Chemotherapy   The patient had taxotere for chemotherapy treatment.     03/02/2019 Tumor Marker   Patient's tumor was tested for the following markers: CA-125. 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   No new or progressive metastatic disease within the abdomen or pelvis.   Hepatic cirrhosis and findings of portal venous hypertension. 1.1 cm low-attenuation lesion within left hepatic lobe, which could represent a regenerative nodule or small hepatocellular carcinoma. Recommend abdomen MRI without and with contrast for further characterization.     04/13/2019 Tumor Marker   Patient's tumor was tested for the following markers: CA-125 Results of the tumor marker test revealed 36.8   05/26/2019 Tumor Marker   Patient's tumor was tested for the following markers: CA-125 Results of the tumor marker test revealed 31.6.   06/15/2019 Tumor Marker   Patient's tumor was tested for the following markers: CA-125 Results of the tumor marker test revealed 25.4   07/05/2019 Imaging   1. Stable CT examination. Stable peritoneal thickening in the pelvis and paracolic gutters. Stable solid left deep pelvic implant. Stable trace ascites. No new or progressive metastatic disease. 2. Stable cirrhosis. No discrete liver masses. 3. Stable mild-to-moderate splenomegaly. 4. Aortic Atherosclerosis (ICD10-I70.0).   07/27/2019 Tumor Marker   Patient's tumor was tested for the following markers: CA-125 Results of the tumor marker test revealed 35.4   08/17/2019 Tumor Marker   Patient's tumor was tested for the following markers: Ca-125 Results of the tumor marker test revealed 35.7.   09/17/2019 - 09/21/2019 Hospital Admission   She was admitted due to esophageal variceal bleeding presents with hematemesis and melanotic stool with epigastric pain.  GI consulted, started on octreotide infusion.  Underwent EGD which  showed minimal bleeding, remain on octreotide drip, PPI drip.  Hemoglobin trended down therefore received PRBC transfusion.  Over several days her bleeding subsided, hemodynamically remained stable.    09/17/2019 Procedure   EGD report - Esophageal mucosal scarring, from prior esophageal banding sessions. - Grade I esophageal varices. - Red blood in the cardia and in the gastric fundus. - Portal hypertensive gastropathy. - Normal duodenal bulb, first portion of the duodenum and second portion of the duodenum. - No obvious source of bleeding identified; no ongoing/active bleeding seen during our exam; possible explanations for bleeding include: variceal bleeding (  gastric or esophageal) that had resolved and varices flattened with medical therapy versus dieulafoy lesion versus portal gastropathy bleeding versus other   11/05/2019 Imaging   1. 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 2. Otherwise stable CT exam. Stable peritoneal thickening within the bilateral deep pelvic peritoneum. Stable left deep pelvic implant.No evidence for progressed disease. 3. Stable splenomegaly. 4. Morphologic changes to the liver compatible with cirrhosis. Trace perihepatic and pelvic ascites.   02/22/2020 Imaging   1. New lucent lesion in the left side of the L4 vertebral body measuring 1 cm, concerning for possible osseous metastasis. Consideration for further evaluation with bone scan is suggested. Close attention on follow-up imaging is also recommended to ensure stability. 2. Previously noted peritoneal thickening and nodularity in the low anatomic pelvis has regressed slightly. 3. Borderline enlarged and mildly enlarged pelvic and retroperitoneal lymph nodes appear similar to slightly increased compared to the prior examination, as above. Continued attention on follow-up studies is recommended.  4. Hepatic cirrhosis with evidence of portal venous  hypertension, including dilated portal vein and splenomegaly. 5. Aortic atherosclerosis. 6. Additional incidental findings, as above.   02/22/2020 Tumor Marker   Patient's tumor was tested for the following markers: CA-125 Results of the tumor marker test revealed 30.1   04/06/2020 Tumor Marker   Patient's tumor was tested for the following markers: CA-125 Results of the tumor marker test revealed 35.7   09/11/2020 Tumor Marker   Patient's tumor was tested for the following markers: CA-125. Results of the tumor marker test revealed 26.6.   09/12/2020 Imaging   1. Slow enlargement of a currently 2.9 cubic cm soft tissue nodule along the left adnexa. Tumor deposit not excluded. 2. The left external iliac lymph node has substantially reduced in size, currently 0.9 cm in short axis and formerly 1.4 cm. 3. 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 4. Cirrhosis with portal venous hypertension. 5. Mild distal esophageal wall thickening, probably from esophagitis. Splenomegaly. 6. Scarring in the left kidney. 7. Sigmoid colon diverticulosis. 8. Pelvic floor laxity with small cystocele. 9. Lumbar impingement at L2-3, L3-4, and L4-5.     10/23/2020 Tumor Marker   Patient's tumor was tested for the following markers: CA-125. Results of the tumor marker test revealed 35.3.   12/12/2020 Tumor Marker   Patient's tumor was tested for the following markers: CA-125. Results of the tumor marker test revealed 39.1.   12/13/2020 Imaging   1. Continued increase in size of soft tissue nodule within the left posterior pelvis adjacent to the sigmoid colon. Cannot exclude small tumor deposit. 2. Similar appearance of thickening and mild nodularity along the peritoneal reflections of the pelvis and pericolic gutters. 3. Morphologic findings of the liver compatible with cirrhosis. 4.  Splenomegaly. 5. Aortic Atherosclerosis (ICD10-I70.0).   01/24/2021 Tumor Marker   Patient's tumor was tested for the following markers: CA-125. Results of the tumor marker test revealed 56.2.   02/09/2021 Imaging   1. Mild interval increased size of the solid left adnexal mass which may represent tumor recurrence/deposit. The mass abuts the adjacent sigmoid colon as well as the left posterior wall of the urinary bladder, invasion not excluded. 2. Otherwise no new mass or lymphadenopathy identified in the abdomen or pelvis. 3. Hepatic cirrhosis. Splenomegaly consistent with portal hypertension.   04/11/2021 Imaging   1. Left posterior pelvic soft tissue mass, adjacent to  the sigmoid colon, has slightly increased in size in comparison to recent prior with significant interval increase in size over more remote priors, findings which are highly suspicious for active neoplastic disease involvement. 2. No new discrete omental or peritoneal nodules/masses identified. 3. Similar nodular thickening of the peritoneum along the bilateral pericolic gutters and in the pelvis without ascites. 4. No pathologically enlarged abdominal or pelvic lymph nodes and no solid organ metastases visualized. 5. Cirrhotic hepatic morphology with evidence of portal hypertension including splenomegaly. 6. Sigmoid colonic diverticulosis without findings of acute diverticulitis. 7. Moderate volume of formed stool throughout the colon. 8. Aortic Atherosclerosis (ICD10-I70.0).   04/11/2021 Tumor Marker   Patient's tumor was tested for the following markers: CA-125. Results of the tumor marker test revealed 79.4 .   04/23/2021 - 10/09/2021 Chemotherapy   Patient is on Treatment Plan : Ovarian Docetaxel q21d     04/24/2021 Tumor Marker   Patient's tumor was tested for the following markers: CA-125. Results of the tumor marker test revealed 68.5.   05/16/2021 Tumor Marker   Patient's tumor was tested for the following markers:  CA-125. Results of the tumor marker test revealed 68.1.   06/28/2021 Tumor Marker   Patient's tumor was tested for the following markers: CA-125. Results of the tumor marker test revealed 52.   07/16/2021 Imaging   1. Stable cirrhotic changes involving the liver. Associated portal venous hypertension, portal venous collaterals and splenomegaly. No hepatic lesions. 2. Slight interval decrease in size of the left pelvic implant. 3. Stable minimal peritoneal nodularity involving the pericolic gutters bilaterally. No new or progressive findings.     08/09/2021 Tumor Marker   Patient's tumor was tested for the following markers: CA-125. Results of the tumor marker test revealed 44.1.   09/20/2021 Tumor Marker   Patient's tumor was tested for the following markers: CA-125. Results of the tumor marker test revealed 38.2.   10/31/2021 Imaging   1. Stable exam. No new or progressive interval findings. 2. Stable appearance of the soft tissue nodule in the left pelvis. 3. Subtle omental nodule is similar to prior. The minimal peritoneal nodularity described previously in the para colic gutters is similar to prior. 4. Cirrhosis with splenomegaly. 5. Cholelithiasis. 6. Left colonic diverticulosis without diverticulitis. 7. Aortic Atherosclerosis (ICD10-I70.0).   10/31/2021 Tumor Marker   Patient's tumor was tested for the following markers: Ca-125. Results of the tumor marker test revealed 37.7.   11/01/2021 - 11/01/2021 Chemotherapy   Patient is on Treatment Plan : BREAST Docetaxel (77) q21d     11/18/2021 -  Chemotherapy   She started on Niraparib   12/03/2021 Tumor Marker   Patient's tumor was tested for the following markers: CA-125. Results of the tumor marker test revealed 45.5.   12/27/2021 Tumor Marker   Patient's tumor was tested for the following markers: CA-125. Results of the tumor marker test revealed 48.3.     PHYSICAL EXAMINATION: ECOG PERFORMANCE STATUS: 0 -  Asymptomatic  Vitals:   01/29/22 0754  BP: (!) 160/76  Pulse: 78  Resp: 18  Temp: (!) 97.5 F (36.4 C)  SpO2: 99%   Filed Weights   01/29/22 0754  Weight: 175 lb (79.4 kg)    GENERAL:alert, no distress and comfortable NEURO: alert & oriented x 3 with fluent speech, no focal motor/sensory deficits  LABORATORY DATA:  I have reviewed the data as listed    Component Value Date/Time   NA 140 12/25/2021 0849   NA 142 02/06/2017 0742  K 4.3 12/25/2021 0849   K 3.9 02/06/2017 0742   CL 107 12/25/2021 0849   CO2 26 12/25/2021 0849   CO2 22 02/06/2017 0742   GLUCOSE 107 (H) 12/25/2021 0849   GLUCOSE 156 (H) 02/06/2017 0742   BUN 18 12/25/2021 0849   BUN 13.5 02/06/2017 0742   CREATININE 0.91 12/25/2021 0849   CREATININE 0.87 10/30/2021 0732   CREATININE 0.8 02/06/2017 0742   CALCIUM 9.3 12/25/2021 0849   CALCIUM 8.5 02/06/2017 0742   PROT 7.6 12/25/2021 0849   PROT 6.9 02/06/2017 0742   ALBUMIN 4.2 12/25/2021 0849   ALBUMIN 3.7 02/06/2017 0742   AST 21 12/25/2021 0849   AST 19 10/30/2021 0732   AST 47 (H) 02/06/2017 0742   ALT 19 12/25/2021 0849   ALT 17 10/30/2021 0732   ALT 54 02/06/2017 0742   ALKPHOS 103 12/25/2021 0849   ALKPHOS 130 02/06/2017 0742   BILITOT 0.7 12/25/2021 0849   BILITOT 0.6 10/30/2021 0732   BILITOT 0.65 02/06/2017 0742   GFRNONAA >60 12/25/2021 0849   GFRNONAA >60 10/30/2021 0732   GFRAA >60 11/09/2019 0953   GFRAA >60 10/26/2019 0722    No results found for: "SPEP", "UPEP"  Lab Results  Component Value Date   WBC 3.4 (L) 01/29/2022   NEUTROABS 2.2 01/29/2022   HGB 12.8 01/29/2022   HCT 37.9 01/29/2022   MCV 92.2 01/29/2022   PLT 87 (L) 01/29/2022      Chemistry      Component Value Date/Time   NA 140 12/25/2021 0849   NA 142 02/06/2017 0742   K 4.3 12/25/2021 0849   K 3.9 02/06/2017 0742   CL 107 12/25/2021 0849   CO2 26 12/25/2021 0849   CO2 22 02/06/2017 0742   BUN 18 12/25/2021 0849   BUN 13.5 02/06/2017 0742    CREATININE 0.91 12/25/2021 0849   CREATININE 0.87 10/30/2021 0732   CREATININE 0.8 02/06/2017 0742      Component Value Date/Time   CALCIUM 9.3 12/25/2021 0849   CALCIUM 8.5 02/06/2017 0742   ALKPHOS 103 12/25/2021 0849   ALKPHOS 130 02/06/2017 0742   AST 21 12/25/2021 0849   AST 19 10/30/2021 0732   AST 47 (H) 02/06/2017 0742   ALT 19 12/25/2021 0849   ALT 17 10/30/2021 0732   ALT 54 02/06/2017 0742   BILITOT 0.7 12/25/2021 0849   BILITOT 0.6 10/30/2021 0732   BILITOT 0.65 02/06/2017 3244

## 2022-01-29 NOTE — Assessment & Plan Note (Signed)
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

## 2022-01-30 ENCOUNTER — Telehealth: Payer: Self-pay | Admitting: Hematology and Oncology

## 2022-01-30 NOTE — Telephone Encounter (Signed)
Scheduled appointment per 12/19 los. Patient is aware.

## 2022-01-31 LAB — CA 125: Cancer Antigen (CA) 125: 43.6 U/mL — ABNORMAL HIGH (ref 0.0–38.1)

## 2022-02-17 ENCOUNTER — Other Ambulatory Visit: Payer: Self-pay | Admitting: Neurology

## 2022-02-17 DIAGNOSIS — G2581 Restless legs syndrome: Secondary | ICD-10-CM

## 2022-02-19 ENCOUNTER — Encounter: Payer: Self-pay | Admitting: Hematology and Oncology

## 2022-02-19 ENCOUNTER — Telehealth: Payer: Self-pay | Admitting: Pharmacy Technician

## 2022-02-19 ENCOUNTER — Other Ambulatory Visit (HOSPITAL_COMMUNITY): Payer: Self-pay

## 2022-02-19 NOTE — Telephone Encounter (Signed)
Oral Oncology Patient Advocate Encounter   Was successful in securing patient a $5,000 grant from [employer] to provide copayment coverage for Zejula.  This will keep the out of pocket expense at $0.     I have spoken with the patient.    The billing information is as follows and has been shared with [place].   Member ID: [id number] Group ID: [id number] RxBin: [id number] PCN: [id number] Dates of Eligibility: 02/19/22 through 02/20/23  Fund name:  [facility].   Olivia Benson, CPhT-Adv Oncology Pharmacy Patient [name] Direct Number: [phone]  Fax: [phone]

## 2022-02-25 ENCOUNTER — Encounter: Payer: Self-pay | Admitting: Hematology and Oncology

## 2022-02-26 ENCOUNTER — Ambulatory Visit (HOSPITAL_COMMUNITY)
Admission: RE | Admit: 2022-02-26 | Discharge: 2022-02-26 | Disposition: A | Discharge status: Home / Self Care | Payer: Medicare Other | Source: Ambulatory Visit | Attending: Hematology and Oncology | Admitting: Hematology and Oncology

## 2022-02-26 ENCOUNTER — Other Ambulatory Visit (HOSPITAL_COMMUNITY): Payer: Self-pay

## 2022-02-26 DIAGNOSIS — C482 Malignant neoplasm of peritoneum, unspecified: Secondary | ICD-10-CM | POA: Diagnosis not present

## 2022-02-26 MED ORDER — IOHEXOL 9 MG/ML PO SOLN
ORAL | Status: AC
  Filled 2022-02-26: qty 1000

## 2022-02-26 MED ORDER — HEPARIN SOD (PORK) LOCK FLUSH 100 UNIT/ML IV SOLN
INTRAVENOUS | Status: AC
  Filled 2022-02-26: qty 5

## 2022-02-26 MED ORDER — HEPARIN SOD (PORK) LOCK FLUSH 100 UNIT/ML IV SOLN
500.0000 [IU] | Freq: Once | INTRAVENOUS | Status: AC
  Administered 2022-02-26: 500 [IU] via INTRAVENOUS

## 2022-02-26 MED ORDER — IOHEXOL 9 MG/ML PO SOLN
1000.0000 mL | ORAL | Status: AC
  Administered 2022-02-26: 1000 mL via ORAL

## 2022-02-26 MED ORDER — SODIUM CHLORIDE (PF) 0.9 % IJ SOLN
INTRAMUSCULAR | Status: AC
  Filled 2022-02-26: qty 50

## 2022-02-26 MED ORDER — IOHEXOL 300 MG/ML  SOLN
100.0000 mL | Freq: Once | INTRAMUSCULAR | Status: AC | PRN
  Administered 2022-02-26: 100 mL via INTRAVENOUS

## 2022-02-28 ENCOUNTER — Inpatient Hospital Stay: Payer: Medicare Other | Attending: Hematology and Oncology | Admitting: Hematology and Oncology

## 2022-02-28 ENCOUNTER — Other Ambulatory Visit: Payer: Self-pay

## 2022-02-28 ENCOUNTER — Other Ambulatory Visit (HOSPITAL_COMMUNITY): Payer: Self-pay

## 2022-02-28 ENCOUNTER — Encounter: Payer: Self-pay | Admitting: Hematology and Oncology

## 2022-02-28 VITALS — BP 154/67 | HR 80 | Temp 97.9°F | Resp 18 | Ht 62.0 in | Wt 179.0 lb

## 2022-02-28 DIAGNOSIS — R161 Splenomegaly, not elsewhere classified: Secondary | ICD-10-CM | POA: Diagnosis not present

## 2022-02-28 DIAGNOSIS — D61818 Other pancytopenia: Secondary | ICD-10-CM | POA: Diagnosis not present

## 2022-02-28 DIAGNOSIS — C482 Malignant neoplasm of peritoneum, unspecified: Secondary | ICD-10-CM | POA: Diagnosis not present

## 2022-02-28 NOTE — Assessment & Plan Note (Signed)
I have reviewed multiple imaging studies with the patient Overall, she has stable disease Even though she has clinically measurable disease, she have no evidence of disease progression or new lesions Overall, she tolerated maintenance Zejula well even at reduced dose I recommend we continue treatment indefinitely for now I plan to repeat imaging study again in 3 to 4 months, probably due around end of April or early May

## 2022-02-28 NOTE — Progress Notes (Signed)
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[facility]) I have reviewed multiple imaging studies with the patient Overall, she has stable disease Even though she has clinically measurable disease, she have no evidence of disease progression or new lesions Overall, she tolerated maintenance Zejula well even at reduced dose I recommend we continue treatment indefinitely for now I plan to repeat imaging study again in 3 to 4 months, probably due around end of April or early May  Pancytopenia, acquired [facility]) 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   All questions were answered. The patient knows to call the clinic with any problems, questions or concerns. The total time spent in the appointment was 30 minutes encounter with patients including review of chart and various tests results, discussions about plan of care and coordination of care plan   Olivia Lark, MD 02/28/2022 4:31 PM  INTERVAL HISTORY: Please see below for problem oriented charting. she returns for treatment follow-up on maintenance Zejula We review multiple imaging studies together Since last time I saw her, she is doing well Denies recent bleeding complications from treatment No recent infection Denies abdominal pain or nausea  REVIEW OF SYSTEMS:   Constitutional: Denies fevers, chills or abnormal weight loss Eyes: Denies blurriness of vision Ears, nose, mouth, throat, and face: Denies mucositis or sore throat Respiratory: Denies cough, dyspnea  or wheezes Cardiovascular: Denies palpitation, chest discomfort or lower extremity swelling Gastrointestinal:  Denies nausea, heartburn or change in bowel habits Skin: Denies abnormal skin rashes Lymphatics: Denies new lymphadenopathy or easy bruising Neurological:Denies numbness, tingling or new weaknesses Behavioral/Psych: Mood is stable, no new changes  All other systems were reviewed with the patient and are negative.  I have reviewed the past medical history, past surgical history, social history and family history with the patient and they are unchanged from previous note.  ALLERGIES:  is allergic to carboplatin, shellfish allergy, vancomycin, benadryl [diphenhydramine hcl], penicillins, and tape.  MEDICATIONS:  Current Outpatient Medications  Medication Sig Dispense Refill   calcium elemental as carbonate (BARIATRIC TUMS ULTRA) 400 MG chewable tablet Chew 1 tablet by mouth 2 (two) times daily.     Cholecalciferol (VITAMIN D) 50 MCG (2000 UT) tablet Take 2,000 Units by mouth at bedtime.     gabapentin (NEURONTIN) 300 MG capsule TAKE 3 CAPSULES BY MOUTH TWICE DAILY 120 capsule 11   HYDROcodone-acetaminophen (NORCO/VICODIN) 5-325 MG tablet Take 1 tablet by mouth every 6 (six) hours as needed for moderate pain. 60 tablet 0   insulin degludec (TRESIBA FLEXTOUCH) 100 UNIT/ML FlexTouch Pen Inject 28 Units into the skin daily.     lidocaine-prilocaine (EMLA) cream Apply to Porta-cath  1-2 hours prior to access as directed. (Patient taking differently: Apply 1 application  topically daily as needed (port access).) 30 g 9   NEUPRO 4 MG/24HR APPLY 1 PATCH TOPICALLY TO THE SKIN DAILY 90 patch 0   niraparib tosylate (ZEJULA) 100 MG tablet Take 1 tablet (100 mg total) by mouth daily. 30 tablet 9   ondansetron (ZOFRAN) 8 MG tablet  TAKE 1 TABLET(8 MG) BY MOUTH EVERY 8 HOURS AS NEEDED 60 tablet 1   OZEMPIC, 0.25 OR 0.5 MG/DOSE, 2 MG/1.5ML SOPN Inject 0.25 mg into the skin every Saturday.      pantoprazole (PROTONIX) 40 MG tablet Take 1 tablet (40 mg total) by mouth daily.     No current facility-administered medications for this visit.    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 Had allergic reaction to carboplatin. Avastin was stopped due to GI hemorrhage   Primary peritoneal carcinomatosis ([place])  09/06/2011 Imaging   CT findings consistent with cirrhotic changes involving the liver.  No worrisome liver mass.  There are associated portal venous collaterals and splenomegaly consistent with portal venous hypertension. 2.  No other significant upper abdominal findings   11/08/2013 Imaging   US showed ascites   11/08/2013 Initial Diagnosis   Patient had ~ 2 months of some urinary incontinence, saw Dr Olivia Benson in 10-2013, with pelvic mass on exam    11/12/2013 Imaging   There are findings of progressive hepatic cirrhosis with a large amount of ascites and mild splenomegaly. There are venous collaterals present. 2. There is abnormal thickening of the peritoneal surface along the left mid and lower abdominal wall. There is abnormal soft tissue density in the pelvis as well which could reflect the clinically suspected ovarian malignancy but the findings are nondiagnostic. 3. There is a new small left pleural effusion. 4. There is no acute bowel abnormality.   11/18/2013 Imaging   Successful ultrasound guided diagnostic and therapeutic paracentesis yielding 4.1 liters of ascites.     11/18/2013 Pathology Results   PERITONEAL/ASCITIC FLUID (SPECIMEN 1 OF 1 COLLECTED 11/18/13): SEROUS CARCINOMA, PLEASE SEE COMMENT.   12/01/2013 Tumor Marker   Patient's tumor was tested for the following markers: CA125 Results of the tumor marker test revealed 1938   12/03/2013 Imaging   Successful ultrasound-guided therapeutic paracentesis yielding 3.6 liters of peritoneal fluid.   12/07/2013 -  05/30/2014 Chemotherapy   She received 3 cycles of carboplatin and Taxol, interrupted cycle 4 for surgery.  She subsequently completed 3 more cycles of chemotherapy after surgery   12/20/2013 Imaging   Successful ultrasound-guided diagnostic and therapeutic paracentesis yielding 2.2 liters of peritoneal fluid   12/20/2013 Pathology Results   PERITONEAL/ASCITIC FLUID(SPECIMEN 1 OF 1 COLLECTED 12/20/13): MALIGNANT CELLS CONSISTENT WITH SEROUS CARCINOMA   01/13/2014 Tumor Marker   Patient's tumor was tested for the following markers: CA125 Results of the tumor marker test revealed 227   02/07/2014 Tumor Marker   Patient's tumor was tested for the following markers: CA125 Results of the tumor marker test revealed 130   02/15/2014 Genetic Testing   Genetics testing from 02-2014 normal ([employer] panel)   02/23/2014 Imaging   Interval decrease in ascites. 2. Morphologic changes in the liver consistent with cirrhosis. 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 Portal vein remains patent at this time. 3. Persistent but decreased abnormal soft tissue attenuation tracking in the omental fat. This may be secondary to interval improvement in metastatic disease. 4. Peritoneal thickening seen in the left abdomen and pelvis on the previous study has decreased and nearly resolved in the interval. This also suggests interval improvement in metastatic disease.     03/07/2014 Procedure   Ultrasound and fluoroscopically guided right internal jugular single lumen power port catheter insertion. Tip in the SVC/RA junction. Catheter ready for use.   03/17/2014 Tumor Marker  Patient's tumor was tested for the following markers: CA125 Results of the tumor marker test revealed 45   03/29/2014 Pathology Results   1. Omentum, resection for tumor - HIGH GRADE SEROUS CARCINOMA, SEE COMMENT. 2. Ovary and fallopian tube, right - HIGH GRADE SEROUS CARCINOMA, SEE COMMENT. 3. Ovary and  fallopian tube, left - HIGH GRADE SEROUS CARCINOMA, SEE COMMENT. Diagnosis Note 1. Nests and clusters of malignant cells are invading the omental tissue (part #1) with associated fibrosis. The cells are pleomorphic with prominent nucleoli. There are scattered psammoma bodies. The ovaries are atrophic and exhibit multiple foci of surface based invasive carcinoma and associated fibrosis. The fallopian tubes have a few foci of carcinoma, also superficially located. There are no precursor lesions noted in the ovaries or fallopian tubes (the entire tubes and ovaries were submitted for evaluation). While there is some retraction artifact, there are several foci suspicious for lymphovascular invasion. 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 Given the fibrosis, there does appear to be a small amount of treatment effect, however, there is abundant residual tumor.    03/29/2014 Surgery   Procedure(s) Performed: 1. Exploratory laparotomy with bilateral salpingo-oophorectomy, omentectomy radical tumor debulking for ovarian cancer .   Surgeon: Thereasa Solo, MD.   Assistant Surgeon: Lahoma Crocker, M.D. Assistant: (an MD assistant was necessary for tissue manipulation, retraction and positioning due to the complexity of the case and hospital policies).  Operative Findings: 10cm omental cake from hepatic to splenic flexure, densely adherent to transverse colon. Milliary studding of tumor implants (<71m, too numerous in number to count) adherent to the mesentery of the small bowel and small bowel wall. Small volume (100cc) ascites. Small ovaries bilaterally, densely adherent to the pelvic cul de sac. Left ovary and tube densely adherent to sigmoid colon. Cirrhotic liver with hepatomegaly and splenomegaly.     This represented an optimal cytoreduction (R1) with visible disease  residual on the bowel wall and mesentery (millial, <373mimplants).      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 2. 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     08/18/2014 Tumor Marker   Patient's tumor was tested for the following markers: CA125 Results of the tumor marker test revealed 16   10/13/2014 Tumor Marker   Patient's tumor was tested for the following markers: CA125 Results of the tumor marker test revealed 14   11/21/2014 Tumor Marker   Patient's tumor was tested for the following markers: CA125 Results of the tumor marker test revealed 16   12/28/2014 Tumor Marker   Patient's tumor was tested for the following markers: CA125 Results of the tumor marker test revealed 762   02/27/2015 Tumor Marker   Patient's tumor was tested for the following markers: CA125 Results of the tumor marker test revealed 20.2   03/22/2015 Imaging   Filler appearance to the prior exam, with very subtle fluid tracking  along portions of the liver, and very minimal nodularity along the right paracolic gutter representing residua from the prior peritoneal cancer. The current abnormalities could simply be post therapy findings rather than necessarily representing residual malignancy. No new or enlarging lesions are identified. 2. Hepatic cirrhosis and splenomegaly. There is some gastric varices suggesting portal venous hypertension. 3. Left foraminal stenosis at L4-5 due to spurring. There is likely also some central  narrowing of the thecal sac at this level. 4. Bibasilar scarring. 5. Chronic mild left mid kidney scarring   03/27/2015 Tumor Marker   Patient's tumor was tested for the following markers: CA125 Results of the tumor marker test revealed 25.3   04/24/2015 Imaging   Disc bulge L2-3 with mild spinal stenosis and right foraminal encroachment. 2. Disc bulge L3-4 with mild spinal stenosis. 3. Multifactorial spinal, left lateral recess and foraminal stenosis L4-5. There is grade 1 anterolisthesis without evident dynamic instability.   05/29/2015 Tumor Marker   Patient's tumor was tested for the following markers: CA125 Results of the tumor marker test revealed 29.9   08/21/2015 Tumor Marker   Patient's tumor was tested for the following markers: CA125 Results of the tumor marker test revealed 45   09/18/2015 Tumor Marker   Patient's tumor was tested for the following markers: CA125 Results of the tumor marker test revealed 47   09/27/2015 Imaging   New small bowel mesenteric nodule and enlarged left external iliac lymph node, highly worrisome for metastatic disease. 2. Cirrhosis and splenomegaly   10/10/2015 Tumor Marker   Patient's tumor was tested for the following markers: CA125 Results of the tumor marker test revealed 53.4   10/10/2015 - 12/11/2015 Chemotherapy   She received 4 cycles of carboplatin only   11/20/2015 Tumor Marker   Patient's tumor was tested for the following markers: CA125 Results of the tumor marker test revealed 41.3   12/20/2015 Imaging   CT: Mild left external iliac lymphadenopathy is slightly decreased. Mildly enlarged lower mesenteric nodule is slightly decreased. 2. No new or progressive metastatic disease in the abdomen or pelvis. 3. Cirrhosis. Stable subcentimeter hypodense left liver lobe lesion. No new liver lesions. 4. Stable mild splenomegaly.  No ascites. 5. Mild sigmoid diverticulosis.   01/01/2016 -  Anti-estrogen oral therapy   She was placed on  tamoxifen. Had letrozole initially but switched to tamoxifen due to poor tolerance   01/15/2016 Tumor Marker   Patient's tumor was tested for the following markers: CA125 Results of the tumor marker test revealed 31.4   02/19/2016 Tumor Marker   Patient's tumor was tested for the following markers: CA125 Results of the tumor marker test revealed 36.5   05/13/2016 Imaging   No evidence of disease progression within the abdomen or pelvis. Previously noted small central mesenteric nodule and left external iliac node appear slightly smaller. 2. Stable changes of hepatic cirrhosis and portal hypertension with associated splenomegaly. No new or enlarging hepatic lesions are identified. 3. No acute findings.   05/13/2016 Tumor Marker   Patient's tumor was tested for the following markers: CA125 Results of the tumor marker test revealed 41.2   06/24/2016 Tumor Marker   Patient's tumor was tested for the following markers: CA125 Results of the tumor marker test revealed 47.3   08/20/2016 Imaging   1. The left external iliac lymph node is slightly increased in size compared to the prior exam, currently 1.1 cm and previously 0.9 cm in diameter. 2. Stable central mesenteric lymph node at 0.9 cm in diameter.  3. Stable trace free pelvic fluid and stable slight thickening along the right paracolic gutter, without well-defined peritoneal nodularity. 4. Other imaging findings of potential clinical significance: Subsegmental atelectasis or scarring in the lung bases. Hepatic cirrhosis. Left renal scarring. Mild splenomegaly. Sigmoid colon diverticulosis. Impingement at L4-5 due to spondylosis and degenerative disc disease broad Schmorl' s nodes at L3-L4. Pelvic floor laxity   08/23/2016 Tumor Marker   Patient's tumor was tested for the following markers: CA125 Results of the tumor marker test revealed 80.2   09/04/2016 Imaging   LV EF: 60% -   65%   09/12/2016 Tumor Marker   Patient's tumor was tested for the  following markers: CA125 Results of the tumor marker test revealed 98.5   09/12/2016 - 01/29/2018 Chemotherapy   The patient received Doxil and Avastin. Avastin was discontinued in Dec 2019 due to GI hemorrhage   09/26/2016 Tumor Marker   Patient's tumor was tested for the following markers: CA125 Results of the tumor marker test revealed 68.9   10/10/2016 Tumor Marker   Patient's tumor was tested for the following markers: CA125 Results of the tumor marker test revealed 65.6   11/07/2016 Tumor Marker   Patient's tumor was tested for the following markers: CA125 Results of the tumor marker test revealed 46.4   11/29/2016 Imaging   Stable mild peritoneal thickening, suspicious for peritoneal carcinomatosis. No new or progressive disease identified. No evidence of ascites.  No significant change in 10 mm left external iliac and 8 mm mesenteric lymph nodes.  Stable hepatic cirrhosis, and splenomegaly consistent with portal venous hypertension.  Colonic diverticulosis, without radiographic evidence of diverticulitis.   12/02/2016 Imaging   Normal LV size with EF 55%. Basal inferior and basal inferoseptal hypokinesis. Normal RV size and systolic function. No significant valvular abnormalities.   12/05/2016 Tumor Marker   Patient's tumor was tested for the following markers: CA125 Results of the tumor marker test revealed 49.1   01/09/2017 Tumor Marker   Patient's tumor was tested for the following markers: CA125 Results of the tumor marker test revealed 47.2   02/06/2017 Tumor Marker   Patient's tumor was tested for the following markers: CA125 Results of the tumor marker test revealed 44.1   02/18/2017 Imaging   Stable mild peritoneal thickening. No new or progressive disease identified within the abdomen or pelvis.  Stable tiny sub-cm left external iliac and mesenteric lymph nodes.  Stable hepatic cirrhosis, and splenomegaly consistent with portal venous hypertension. No evidence  of hepatic neoplasm.  Colonic diverticulosis, without radiographic evidence of diverticulitis.   03/06/2017 Tumor Marker   Patient's tumor was tested for the following markers: CA125 Results of the tumor marker test revealed 50.4   03/17/2017 Imaging   - Left ventricle: The cavity size was normal. Wall thickness was normal. Systolic function was normal. The estimated ejection fraction was in the range of 55% to 60%. Wall motion was normal; there were no regional wall motion abnormalities. Features are consistent with a pseudonormal left ventricular filling pattern, with concomitant abnormal relaxation and increased filling pressure (grade 2 diastolic dysfunction). - Mitral valve: There was mild regurgitation. - Left atrium: The atrium was mildly dilated   04/03/2017 Tumor Marker   Patient's tumor was tested for the following markers: CA125 Results of the tumor marker test revealed 47.2   05/14/2017 Imaging   CT scan of abdomen and pelvis Stable mild peritoneal thickening. No new or progressive disease within the abdomen or pelvis.  Stable hepatic cirrhosis. Stable splenomegaly,  consistent with portal venous hypertension. No evidence of hepatic neoplasm.   05/15/2017 Tumor Marker   Patient's tumor was tested for the following markers: CA125 Results of the tumor marker test revealed 56.1   06/12/2017 Tumor Marker   Patient's tumor was tested for the following markers: CA125 Results of the tumor marker test revealed 49.2   07/10/2017 Tumor Marker   Patient's tumor was tested for the following markers: CA125 Results of the tumor marker test revealed 46.3   08/07/2017 Tumor Marker   Patient's tumor was tested for the following markers: CA125 Results of the tumor marker test revealed 52.9   08/20/2017 Imaging   1. Mild omental/peritoneal haziness, unchanged. No evidence of new metastatic disease. 2. Cirrhosis with splenomegaly.   09/04/2017 Tumor Marker   Patient's tumor was tested for the  following markers: CA125 Results of the tumor marker test revealed 48.5   11/13/2017 Tumor Marker   Patient's tumor was tested for the following markers: CA125 Results of the tumor marker test revealed 55.8   12/17/2017 Imaging   1. No findings to suggest metastatic disease in the abdomen or pelvis. 2. Severe hepatic cirrhosis with evidence of portal hypertension, as demonstrated by dilated portal vein, splenomegaly and portosystemic collateral pathways, including gastric and esophageal varices.  3. Colonic diverticulosis without evidence of acute diverticulitis at this time. 4. Additional incidental findings, as above.   01/16/2018 Tumor Marker   Patient's tumor was tested for the following markers: CA125 Results of the tumor marker test revealed 63.7   02/10/2018 Procedure   EGD - Recently bleeding grade III and large (> 5 mm) esophageal varices. Completely eradicated. Banded. - Portal hypertensive gastropathy. - Normal examined duodenum. - No specimens collected   02/13/2018 Tumor Marker   Patient's tumor was tested for the following markers: CA125 Results of the tumor marker test revealed 82.7   02/25/2018 Imaging   Bone density showed mild osteopenia   04/08/2018 Tumor Marker   Patient's tumor was tested for the following markers: CA125 Results of the tumor marker test revealed 82.7   04/08/2018 Imaging   1. No definite omental or peritoneal surface lesions. However, there are 2 slowly enlarging lymph nodes noted. One is in the small bowel mesentery and the other is in the left deep pelvis. Could not exclude recurrent disease. 2. Stable advanced cirrhotic changes involving the liver with portal venous hypertension, portal venous collaterals, esophageal varices and splenomegaly. No worrisome hepatic lesions. 3. Diffuse wall thickening of the colon could suggest diffuse colitis or could be due to low albumin. Recommend correlation with any symptoms such as diarrhea.   05/21/2018  Tumor Marker   Patient's tumor was tested for the following markers: CA125 Results of the tumor marker test revealed 86   09/07/2018 Tumor Marker   Patient's tumor was tested for the following markers:CA-125 Results of the tumor marker test revealed 111   09/29/2018 Tumor Marker   Patient's tumor was tested for the following markers: CA-125 Results of the tumor marker test revealed 121.   09/29/2018 - 11/24/2018 Chemotherapy   The patient had carboplatin and gemzar for chemotherapy treatment.  Chemo was stopped due to allergic reaction to carboplatin   10/27/2018 Tumor Marker   Patient's tumor was tested for the following markers: CA-125 Results of the tumor marker test revealed 99.9   11/24/2018 Tumor Marker   Patient's tumor was tested for the following markers: CA-125 Results of the tumor marker test revealed 81.2   12/14/2018 Tumor Marker  Patient's tumor was tested for the following markers: CA-125 Results of the tumor marker test revealed 81.2   12/14/2018 Imaging   1. Mild improvement in peritoneal disease. Peritoneal nodule within the left posterior pelvis contiguous with the left side of vaginal cuff is slightly decreased in size in the interval. Within the right iliac fossa there is a small peritoneal nodule which has decreased in size in the interval. Central small bowel mesenteric lymph node is not significantly changed. Slight decrease in size left periaortic lymph node.    2. Morphologic features of the liver compatible with cirrhosis. Splenomegaly is noted which may reflect portal venous hypertension.   12/21/2018 Tumor Marker   Patient's tumor was tested for the following markers: CA-125 Results of the tumor marker test revealed 64.2   12/21/2018 -  Chemotherapy   The patient had taxotere for chemotherapy treatment.     03/02/2019 Tumor Marker   Patient's tumor was tested for the following markers: CA-125. Results of the tumor marker test revealed 36.3   03/22/2019  Imaging   Mild peritoneal carcinoma shows further improvement since previous study.   No new or progressive metastatic disease within the abdomen or pelvis.   Hepatic cirrhosis and findings of portal venous hypertension. 1.1 cm low-attenuation lesion within left hepatic lobe, which could represent a regenerative nodule or small hepatocellular carcinoma. Recommend abdomen MRI without and with contrast for further characterization.     04/13/2019 Tumor Marker   Patient's tumor was tested for the following markers: CA-125 Results of the tumor marker test revealed 36.8   05/26/2019 Tumor Marker   Patient's tumor was tested for the following markers: CA-125 Results of the tumor marker test revealed 31.6.   06/15/2019 Tumor Marker   Patient's tumor was tested for the following markers: CA-125 Results of the tumor marker test revealed 25.4   07/05/2019 Imaging   1. Stable CT examination. Stable peritoneal thickening in the pelvis and paracolic gutters. Stable solid left deep pelvic implant. Stable trace ascites. No new or progressive metastatic disease. 2. Stable cirrhosis. No discrete liver masses. 3. Stable mild-to-moderate splenomegaly. 4. Aortic Atherosclerosis (ICD10-I70.0).   07/27/2019 Tumor Marker   Patient's tumor was tested for the following markers: CA-125 Results of the tumor marker test revealed 35.4   08/17/2019 Tumor Marker   Patient's tumor was tested for the following markers: Ca-125 Results of the tumor marker test revealed 35.7.   09/17/2019 - 09/21/2019 Hospital Admission   She was admitted due to esophageal variceal bleeding presents with hematemesis and melanotic stool with epigastric pain.  GI consulted, started on octreotide infusion.  Underwent EGD which showed minimal bleeding, remain on octreotide drip, PPI drip.  Hemoglobin trended down therefore received PRBC transfusion.  Over several days her bleeding subsided, hemodynamically remained stable.    09/17/2019 Procedure    EGD report - Esophageal mucosal scarring, from prior esophageal banding sessions. - Grade I esophageal varices. - Red blood in the cardia and in the gastric fundus. - Portal hypertensive gastropathy. - Normal duodenal bulb, first portion of the duodenum and second portion of the duodenum. 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 2. Otherwise stable CT exam.  Stable peritoneal thickening within the bilateral deep pelvic peritoneum. Stable left deep pelvic implant.No evidence for progressed disease. 3. Stable splenomegaly. 4. Morphologic changes to the liver compatible with cirrhosis. Trace perihepatic and pelvic ascites.   02/22/2020 Imaging   1. New lucent lesion in the left side of the L4 vertebral body measuring 1 cm, concerning for possible osseous metastasis. Consideration for further evaluation with bone scan is suggested. Close attention on follow-up imaging is also recommended to ensure stability. 2. Previously noted peritoneal thickening and nodularity in the low anatomic pelvis has regressed slightly. 3. Borderline enlarged and mildly enlarged pelvic and retroperitoneal lymph nodes appear similar to slightly increased compared to the prior examination, as above. Continued attention on follow-up studies is recommended.  4. 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 5. Aortic atherosclerosis. 6. Additional incidental findings, as above.   02/22/2020 Tumor Marker   Patient's tumor was tested for the following markers:  CA-125 Results of the tumor marker test revealed 30.1   04/06/2020 Tumor Marker   Patient's tumor was tested for the following markers: CA-125 Results of the tumor marker test revealed 35.7   09/11/2020 Tumor Marker   Patient's tumor was tested for the following markers: CA-125. Results of the tumor marker test revealed 26.6.   09/12/2020 Imaging   1. Slow enlargement of a currently 2.9 cubic cm soft tissue nodule along the left adnexa. Tumor deposit not excluded. 2. The left external iliac lymph node has substantially reduced in size, currently 0.9 cm in short axis and formerly 1.4 cm. 3. 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 4. Cirrhosis with portal venous hypertension. 5. Mild distal esophageal wall thickening, probably from esophagitis. Splenomegaly. 6. Scarring in the left kidney. 7. Sigmoid colon diverticulosis. 8. Pelvic floor laxity with small cystocele. 9. Lumbar impingement at L2-3, L3-4, and L4-5.     10/23/2020 Tumor Marker   Patient's tumor was tested for the following markers: CA-125. Results of the tumor marker test revealed 35.3.   12/12/2020 Tumor Marker   Patient's tumor was tested for the following markers: CA-125. Results of the tumor marker test revealed 39.1.   12/13/2020 Imaging   1. Continued increase in size of soft tissue nodule within the left posterior pelvis adjacent to the sigmoid colon. Cannot exclude small tumor deposit. 2. Similar appearance of thickening and mild nodularity along the peritoneal reflections of the pelvis and pericolic gutters. 3. Morphologic findings of the liver compatible with cirrhosis. 4. Splenomegaly. 5. Aortic Atherosclerosis (ICD10-I70.0).   01/24/2021 Tumor Marker   Patient's tumor was tested for the following markers: CA-125. Results of the tumor marker test revealed 56.2.   02/09/2021 Imaging   1. Mild  interval increased size of the solid left adnexal mass which may represent tumor recurrence/deposit. The mass abuts the adjacent sigmoid colon as well as the left posterior wall of the urinary bladder, invasion not excluded. 2. Otherwise no new mass or lymphadenopathy identified in the abdomen or pelvis. 3. Hepatic cirrhosis. Splenomegaly consistent with portal hypertension.   04/11/2021 Imaging   1. Left posterior pelvic soft tissue mass, adjacent to the sigmoid colon, has slightly increased in size in comparison to recent prior with significant interval increase in size over more remote priors, findings which are highly suspicious for active neoplastic disease involvement. 2. No new discrete omental or peritoneal nodules/masses identified. 3. Similar nodular thickening of the  peritoneum along the bilateral pericolic gutters and in the pelvis without ascites. 4. No pathologically enlarged abdominal or pelvic lymph nodes and no solid organ metastases visualized. 5. Cirrhotic hepatic morphology with evidence of portal hypertension including splenomegaly. 6. Sigmoid colonic diverticulosis without findings of acute diverticulitis. 7. Moderate volume of formed stool throughout the colon. 8. Aortic Atherosclerosis (ICD10-I70.0).   04/11/2021 Tumor Marker   Patient's tumor was tested for the following markers: CA-125. Results of the tumor marker test revealed 79.4 .   04/23/2021 - 10/09/2021 Chemotherapy   Patient is on Treatment Plan : Ovarian Docetaxel q21d     04/24/2021 Tumor Marker   Patient's tumor was tested for the following markers: CA-125. Results of the tumor marker test revealed 68.5.   05/16/2021 Tumor Marker   Patient's tumor was tested for the following markers: CA-125. Results of the tumor marker test revealed 68.1.   06/28/2021 Tumor Marker   Patient's tumor was tested for the following markers: CA-125. Results of the tumor marker test revealed 52.   07/16/2021 Imaging   1. Stable  cirrhotic changes involving the liver. Associated portal venous hypertension, portal venous collaterals and splenomegaly. No hepatic lesions. 2. Slight interval decrease in size of the left pelvic implant. 3. Stable minimal peritoneal nodularity involving the pericolic gutters bilaterally. No new or progressive findings.     08/09/2021 Tumor Marker   Patient's tumor was tested for the following markers: CA-125. Results of the tumor marker test revealed 44.1.   09/20/2021 Tumor Marker   Patient's tumor was tested for the following markers: CA-125. Results of the tumor marker test revealed 38.2.   10/31/2021 Imaging   1. Stable exam. No new or progressive interval findings. 2. Stable appearance of the soft tissue nodule in the left pelvis. 3. Subtle omental nodule is similar to prior. The minimal peritoneal nodularity described previously in the para colic gutters is similar to prior. 4. Cirrhosis with splenomegaly. 5. Cholelithiasis. 6. Left colonic diverticulosis without diverticulitis. 7. Aortic Atherosclerosis (ICD10-I70.0).   10/31/2021 Tumor Marker   Patient's tumor was tested for the following markers: Ca-125. Results of the tumor marker test revealed 37.7.   11/01/2021 - 11/01/2021 Chemotherapy   Patient is on Treatment Plan : BREAST Docetaxel (77) q21d     11/18/2021 -  Chemotherapy   She started on Niraparib   12/03/2021 Tumor Marker   Patient's tumor was tested for the following markers: CA-125. Results of the tumor marker test revealed 45.5.   12/27/2021 Tumor Marker   Patient's tumor was tested for the following markers: CA-125. Results of the tumor marker test revealed 48.3.   01/31/2022 Tumor Marker   Patient's tumor was tested for the following markers: CA-`125. Results of the tumor marker test revealed 43.6.   02/26/2022 Imaging   Stable soft tissue nodule in the low left hemipelvis in the surgical bed of the previous hysterectomy. Additional small areas of  thickening along the abdomen without new ascites. There is 1 small asymmetric area of increased thickening along the posterolateral left hemipelvis as above. Attention to this area on short follow-up.   Nodular liver with fatty infiltration splenomegaly.   Gallstones.   Aortic atherosclerosis ICD10-I 70.0     PHYSICAL EXAMINATION: ECOG PERFORMANCE STATUS: 0 - Asymptomatic  Vitals:   02/28/22 0830  BP: (!) 154/67  Pulse: 80  Resp: 18  Temp: 97.9 F (36.6 C)  SpO2: 100%   Filed Weights   02/28/22 0830  Weight: 179 lb (81.2 kg)  GENERAL:alert, no distress and comfortable NEURO: alert & oriented x 3 with fluent speech, no focal motor/sensory deficits  LABORATORY DATA:  I have reviewed the data as listed    Component Value Date/Time   NA 143 01/29/2022 0740   NA 142 02/06/2017 0742   K 4.4 01/29/2022 0740   K 3.9 02/06/2017 0742   CL 110 01/29/2022 0740   CO2 20 (L) 01/29/2022 0740   CO2 22 02/06/2017 0742   GLUCOSE 87 01/29/2022 0740   GLUCOSE 156 (H) 02/06/2017 0742   BUN 17 01/29/2022 0740   BUN 13.5 02/06/2017 0742   CREATININE 1.02 (H) 01/29/2022 0740   CREATININE 0.8 02/06/2017 0742   CALCIUM 8.6 (L) 01/29/2022 0740   CALCIUM 8.5 02/06/2017 0742   PROT 7.3 01/29/2022 0740   PROT 6.9 02/06/2017 0742   ALBUMIN 3.8 01/29/2022 0740   ALBUMIN 3.7 02/06/2017 0742   AST 30 01/29/2022 0740   AST 47 (H) 02/06/2017 0742   ALT 25 01/29/2022 0740   ALT 54 02/06/2017 0742   ALKPHOS 92 01/29/2022 0740   ALKPHOS 130 02/06/2017 0742   BILITOT 0.8 01/29/2022 0740   BILITOT 0.65 02/06/2017 0742   GFRNONAA 58 (L) 01/29/2022 0740   GFRAA >60 11/09/2019 0953   GFRAA >60 10/26/2019 0722    No results found for: "SPEP", "UPEP"  Lab Results  Component Value Date   WBC 3.4 (L) 01/29/2022   NEUTROABS 2.2 01/29/2022   HGB 12.8 01/29/2022   HCT 37.9 01/29/2022   MCV 92.2 01/29/2022   PLT 87 (L) 01/29/2022      Chemistry      Component Value Date/Time   NA 143  01/29/2022 0740   NA 142 02/06/2017 0742   K 4.4 01/29/2022 0740   K 3.9 02/06/2017 0742   CL 110 01/29/2022 0740   CO2 20 (L) 01/29/2022 0740   CO2 22 02/06/2017 0742   BUN 17 01/29/2022 0740   BUN 13.5 02/06/2017 0742   CREATININE 1.02 (H) 01/29/2022 0740   CREATININE 0.8 02/06/2017 0742      Component Value Date/Time   CALCIUM 8.6 (L) 01/29/2022 0740   CALCIUM 8.5 02/06/2017 0742   ALKPHOS 92 01/29/2022 0740   ALKPHOS 130 02/06/2017 0742   AST 30 01/29/2022 0740   AST 47 (H) 02/06/2017 0742   ALT 25 01/29/2022 0740   ALT 54 02/06/2017 0742   BILITOT 0.8 01/29/2022 0740   BILITOT 0.65 02/06/2017 0742       RADIOGRAPHIC STUDIES: I have reviewed multiple imaging studies with the patient  I have personally reviewed the radiological images as listed and agreed with the findings in the report. 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 * Tracking Code: BO * EXAM: CT ABDOMEN AND PELVIS WITH CONTRAST TECHNIQUE: Multidetector CT imaging of the abdomen and pelvis was performed using the standard protocol following bolus administration of intravenous contrast. RADIATION DOSE REDUCTION: This exam was performed according to the departmental dose-optimization program which includes automated exposure control, adjustment of the mA and/or kV according to patient size and/or use of iterative reconstruction technique. CONTRAST:  131m OMNIPAQUE IOHEXOL 300 MG/ML  SOLN COMPARISON:  10/30/2021 and older FINDINGS: Lower chest: Bilateral breast implants are noted. There is some linear opacity at the bases likely scar or atelectasis. No pleural effusion. Hepatobiliary: Slightly nodular contour seen of the liver. No space-occupying liver lesion. Patent portal vein. The gallbladder is  nondilated with a punctate possible stone. Pancreas: Preserved pancreatic parenchyma. No obvious mass lesion or abnormal  enhancement. Spleen: Mild splenic enlargement. Cephalocaudal length 14.5 cm. There is also splenule seen in the hilum. Adrenals/Urinary Tract: The adrenal glands are preserved. Mild atrophy of the left kidney. No abnormal renal enhancement or collecting system dilatation. The uterus has a normal course and caliber down to bladder. The bladder is preserved contour. Stomach/Bowel: The large bowel has a normal course and caliber with scattered colonic stool. No obstruction or dilatation. Normal appendix seen in the right lower quadrant extending superior to the cecum. Stomach and small bowel are nondilated. Vascular/Lymphatic: Normal caliber aorta and IVC with mild atherosclerotic calcified plaque along the distal aorta and common iliac vessels. As described previously there are no abnormal nodes in the lower mediastinum, greater lesser curve of the stomach, porta hepatis, para celiac or retroperitoneal. No pelvic sidewall nodes. A few small retroperitoneal nodes are seen but not pathologic by size criteria and similar appearance to the prior. Reproductive: The uterus and ovaries are not seen. Once again in the surgical bed there is a soft tissue nodule which previously measured 3.1 x 2.2 cm on the prior exam and today on series 2, image 73 measures 3.1 by 2.1 cm. No other discrete peritoneal nodule identified. There are scattered areas of mesenteric stranding. There is some thickening along the anterior abdominal wall with rectus muscle diastasis. Other areas of slight thickening identified along the peritoneum and mesentery. There is some thickening along lateral conal fascia on the right. These areas are all stable. Similar areas scribe previously along the anterior left hemiabdomen anteriorly measuring 7 mm on series 2, image 40. No ascites. Only 1 areas slightly more thickening along the posterolateral left hemipelvis on series 2, image 69 with an area measuring 2.3 x 0.9 cm. Recommend attention to this area on  short follow-up. Other: None Musculoskeletal: Curvature and degenerative changes along the spine. Degenerative changes seen along the pelvis. IMPRESSION: Stable soft tissue nodule in the low left hemipelvis in the surgical bed of the previous hysterectomy. Additional small areas of thickening along the abdomen without new ascites. There is 1 small asymmetric area of increased thickening along the posterolateral left hemipelvis as above. Attention to this area on short follow-up. Nodular liver with fatty infiltration splenomegaly. Gallstones. Aortic atherosclerosis ICD10-I 70.0 Electronically Signed   By: Jill Side M.D.   On: 02/26/2022 13:22

## 2022-02-28 NOTE — Assessment & Plan Note (Signed)
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 

## 2022-03-15 ENCOUNTER — Other Ambulatory Visit: Payer: Self-pay | Admitting: Hematology and Oncology

## 2022-03-15 ENCOUNTER — Telehealth: Payer: Self-pay

## 2022-03-15 MED ORDER — HYDROCODONE-ACETAMINOPHEN 5-325 MG PO TABS: 1.0000 | ORAL_TABLET | Freq: Four times a day (QID) | ORAL | 0 refills | Status: DC | PRN

## 2022-03-15 NOTE — Telephone Encounter (Signed)
-----   Message from [name], MD sent at 03/15/2022 10:40 AM EST ----- Regarding: RE: Vicodin rx refill done ----- Message ----- From: Rickard Patience, RN Sent: 03/15/2022  10:38 AM EST To: [name], MD Subject: Vicodin rx refill                              Pt called requesting hydrocodone-acetaminophen refill. Last dispense 01/21/22, last OV was 02/28/22.

## 2022-03-15 NOTE — Telephone Encounter (Signed)
Called and given message that pain rx has been refilled. Pt verbalized understanding and was appreciative of call.

## 2022-03-20 ENCOUNTER — Other Ambulatory Visit (HOSPITAL_COMMUNITY): Payer: Self-pay

## 2022-03-27 ENCOUNTER — Other Ambulatory Visit: Payer: Self-pay

## 2022-03-28 ENCOUNTER — Inpatient Hospital Stay (HOSPITAL_BASED_OUTPATIENT_CLINIC_OR_DEPARTMENT_OTHER): Payer: Medicare Other | Admitting: Hematology and Oncology

## 2022-03-28 ENCOUNTER — Inpatient Hospital Stay: Payer: Medicare Other | Attending: Hematology and Oncology

## 2022-03-28 ENCOUNTER — Encounter: Payer: Self-pay | Admitting: Hematology and Oncology

## 2022-03-28 VITALS — BP 127/62 | HR 76 | Temp 97.7°F | Resp 18 | Ht 62.0 in | Wt 177.8 lb

## 2022-03-28 DIAGNOSIS — C482 Malignant neoplasm of peritoneum, unspecified: Secondary | ICD-10-CM | POA: Insufficient documentation

## 2022-03-28 DIAGNOSIS — Z5111 Encounter for antineoplastic chemotherapy: Secondary | ICD-10-CM

## 2022-03-28 DIAGNOSIS — D61818 Other pancytopenia: Secondary | ICD-10-CM | POA: Insufficient documentation

## 2022-03-28 DIAGNOSIS — R5383 Other fatigue: Secondary | ICD-10-CM | POA: Diagnosis not present

## 2022-03-28 DIAGNOSIS — R161 Splenomegaly, not elsewhere classified: Secondary | ICD-10-CM | POA: Diagnosis not present

## 2022-03-28 LAB — CMP (CANCER CENTER ONLY)
ALT: 22 U/L (ref 0–44)
AST: 25 U/L (ref 15–41)
Albumin: 4.2 g/dL (ref 3.5–5.0)
Alkaline Phosphatase: 106 U/L (ref 38–126)
Anion gap: 8 (ref 5–15)
BUN: 20 mg/dL (ref 8–23)
CO2: 24 mmol/L (ref 22–32)
Calcium: 8.6 mg/dL — ABNORMAL LOW (ref 8.9–10.3)
Chloride: 107 mmol/L (ref 98–111)
Creatinine: 0.93 mg/dL (ref 0.44–1.00)
GFR, Estimated: >60 mL/min (ref >60)
Glucose, Bld: 115 mg/dL — ABNORMAL HIGH (ref 70–99)
Potassium: 4.1 mmol/L (ref 3.5–5.1)
Sodium: 139 mmol/L (ref 135–145)
Total Bilirubin: 0.7 mg/dL (ref 0.3–1.2)
Total Protein: 7.6 g/dL (ref 6.5–8.1)

## 2022-03-28 LAB — CBC WITH DIFFERENTIAL/PLATELET
Abs Immature Granulocytes: 0.01 10*3/uL (ref 0.00–0.07)
Basophils Absolute: 0 10*3/uL (ref 0.0–0.1)
Basophils Relative: 0 %
Eosinophils Absolute: 0.1 10*3/uL (ref 0.0–0.5)
Eosinophils Relative: 3 %
HCT: 36.5 % (ref 36.0–46.0)
Hemoglobin: 12.5 g/dL (ref 12.0–15.0)
Immature Granulocytes: 0 %
Lymphocytes Relative: 22 %
Lymphs Abs: 0.9 10*3/uL (ref 0.7–4.0)
MCH: 32.3 pg (ref 26.0–34.0)
MCHC: 34.2 g/dL (ref 30.0–36.0)
MCV: 94.3 fL (ref 80.0–100.0)
Monocytes Absolute: 0.2 10*3/uL (ref 0.1–1.0)
Monocytes Relative: 5 %
Neutro Abs: 2.7 10*3/uL (ref 1.7–7.7)
Neutrophils Relative %: 70 %
Platelets: 90 10*3/uL — ABNORMAL LOW (ref 150–400)
RBC: 3.87 MIL/uL (ref 3.87–5.11)
RDW: 14.1 % (ref 11.5–15.5)
WBC: 3.9 10*3/uL — ABNORMAL LOW (ref 4.0–10.5)
nRBC: 0 % (ref 0.0–0.2)

## 2022-03-28 MED ORDER — SODIUM CHLORIDE 0.9% FLUSH
10.0000 mL | Freq: Once | INTRAVENOUS | Status: AC
  Administered 2022-03-28: 10 mL

## 2022-03-28 MED ORDER — HEPARIN SOD (PORK) LOCK FLUSH 100 UNIT/ML IV SOLN
500.0000 [IU] | Freq: Once | INTRAVENOUS | Status: AC
  Administered 2022-03-28: 500 [IU]

## 2022-03-28 NOTE — Progress Notes (Signed)
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[facility]) Her last imaging study from January showed stable disease Even though she has clinically measurable disease, she have no evidence of disease progression or new lesions Overall, she tolerated maintenance Zejula well even at reduced dose I recommend we continue treatment indefinitely for now I plan to repeat imaging study again in 3 to 4 months, probably due around end of April or early May  Pancytopenia, acquired [facility]) 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   All questions were answered. The patient knows to call the clinic with any problems, questions or concerns. The total time spent in the appointment was 20 minutes encounter with patients including review of chart and various tests results, discussions about plan of care and coordination of care plan   [name], MD 03/28/2022 10:25 AM  INTERVAL HISTORY: Please see below for problem oriented charting. she returns for treatment follow-up on Zejula She complains of fatigue Denies recent bleeding or recent infection  REVIEW OF SYSTEMS:   Constitutional: Denies fevers, chills or abnormal weight loss Eyes: Denies blurriness of vision Ears, nose, mouth, throat, and face: Denies mucositis or sore throat Respiratory: Denies cough, dyspnea or wheezes Cardiovascular: Denies palpitation, chest discomfort or lower extremity swelling Gastrointestinal:  Denies nausea, heartburn or change in bowel habits Skin:  Denies abnormal skin rashes Lymphatics: Denies new lymphadenopathy or easy bruising Neurological:Denies numbness, tingling or new weaknesses Behavioral/Psych: Mood is stable, no new changes  All other systems were reviewed with the patient and are negative.  I have reviewed the past medical history, past surgical history, social history and family history with the patient and they are unchanged from previous note.  ALLERGIES:  is allergic to carboplatin, shellfish allergy, vancomycin, benadryl [diphenhydramine hcl], penicillins, and tape.  MEDICATIONS:  Current Outpatient Medications  Medication Sig Dispense Refill   calcium elemental as carbonate (BARIATRIC TUMS ULTRA) 400 MG chewable tablet Chew 1 tablet by mouth 2 (two) times daily.     Cholecalciferol (VITAMIN D) 50 MCG (2000 UT) tablet Take 2,000 Units by mouth at bedtime.     gabapentin (NEURONTIN) 300 MG capsule TAKE 3 CAPSULES BY MOUTH TWICE DAILY 120 capsule 11   HYDROcodone-acetaminophen (NORCO/VICODIN) 5-325 MG tablet Take 1 tablet by mouth every 6 (six) hours as needed for moderate pain. 60 tablet 0   insulin degludec (TRESIBA FLEXTOUCH) 100 UNIT/ML FlexTouch Pen Inject 28 Units into the skin daily.     lidocaine-prilocaine (EMLA) cream Apply to Porta-cath  1-2 hours prior to access as directed. (Patient taking differently: Apply 1 application  topically daily as needed (port access).) 30 g 9   NEUPRO 4 MG/24HR APPLY 1 PATCH TOPICALLY TO THE SKIN DAILY 90 patch 0   niraparib tosylate (ZEJULA) 100 MG tablet Take 1 tablet (100 mg total) by mouth daily. 30 tablet 9   ondansetron (ZOFRAN) 8 MG tablet TAKE 1 TABLET(8 MG) BY MOUTH EVERY 8 HOURS AS NEEDED 60 tablet 1   OZEMPIC, 0.25 OR 0.5 MG/DOSE, 2 MG/1.5ML SOPN Inject 0.25  mg into the skin every Saturday.     pantoprazole (PROTONIX) 40 MG tablet Take 1 tablet (40 mg total) by mouth daily.     No current facility-administered medications for this visit.    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 Had allergic reaction to carboplatin. Avastin was stopped due to GI hemorrhage   Primary peritoneal carcinomatosis ([place])  09/06/2011 Imaging   CT findings consistent with cirrhotic changes involving the liver.  No worrisome liver mass.  There are associated portal venous collaterals and splenomegaly consistent with portal venous hypertension. 2.  No other significant upper abdominal findings   11/08/2013 Imaging   US showed ascites   11/08/2013 Initial Diagnosis   Patient had ~ 2 months of some urinary incontinence, saw Dr Ronita Hipps in 10-2013, with pelvic mass on exam    11/12/2013 Imaging   There are findings of progressive hepatic cirrhosis with a large amount of ascites and mild splenomegaly. There are venous collaterals present. 2. There is abnormal thickening of the peritoneal surface along the left mid and lower abdominal wall. There is abnormal soft tissue density in the pelvis as well which could reflect the clinically suspected ovarian malignancy but the findings are nondiagnostic. 3. There is a new small left pleural effusion. 4. There is no acute bowel abnormality.   11/18/2013 Imaging   Successful ultrasound guided diagnostic and therapeutic paracentesis yielding 4.1 liters of ascites.     11/18/2013 Pathology Results   PERITONEAL/ASCITIC FLUID (SPECIMEN 1 OF 1 COLLECTED 11/18/13): SEROUS CARCINOMA, PLEASE SEE COMMENT.   12/01/2013 Tumor Marker   Patient's tumor was tested for the following markers: CA125 Results of the tumor marker test revealed 1938   12/03/2013 Imaging   Successful ultrasound-guided therapeutic paracentesis yielding 3.6 liters of peritoneal fluid.   12/07/2013 - 05/30/2014 Chemotherapy   She received 3 cycles of carboplatin and Taxol, interrupted cycle 4 for surgery.  She subsequently completed 3 more cycles of chemotherapy after surgery    12/20/2013 Imaging   Successful ultrasound-guided diagnostic and therapeutic paracentesis yielding 2.2 liters of peritoneal fluid   12/20/2013 Pathology Results   PERITONEAL/ASCITIC FLUID(SPECIMEN 1 OF 1 COLLECTED 12/20/13): MALIGNANT CELLS CONSISTENT WITH SEROUS CARCINOMA   01/13/2014 Tumor Marker   Patient's tumor was tested for the following markers: CA125 Results of the tumor marker test revealed 227   02/07/2014 Tumor Marker   Patient's tumor was tested for the following markers: CA125 Results of the tumor marker test revealed 130   02/15/2014 Genetic Testing   Genetics testing from 02-2014 normal ([employer]va[employer] panel)   02/23/2014 Imaging   Interval decrease in ascites. 2. Morphologic changes in the liver consistent with cirrhosis. 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 Portal vein remains patent at this time. 3. Persistent but decreased abnormal soft tissue attenuation tracking in the omental fat. This may be secondary to interval improvement in metastatic disease. 4. Peritoneal thickening seen in the left abdomen and pelvis on the previous study has decreased and nearly resolved in the interval. This also suggests interval improvement in metastatic disease.     03/07/2014 Procedure   Ultrasound and fluoroscopically guided right internal jugular single lumen power port catheter insertion. Tip in the SVC/RA junction. Catheter ready for use.   03/17/2014 Tumor Marker   Patient's tumor was tested for the following markers: CA125 Results of the tumor marker test revealed 45   03/29/2014 Pathology Results  1. Omentum, resection for tumor - HIGH GRADE SEROUS CARCINOMA, SEE COMMENT. 2. Ovary and fallopian tube, right - HIGH GRADE SEROUS CARCINOMA, SEE COMMENT. 3. Ovary and fallopian tube, left - HIGH GRADE SEROUS CARCINOMA, SEE COMMENT. Diagnosis Note 1. Nests and clusters of malignant cells are invading the omental tissue (part #1) with associated  fibrosis. The cells are pleomorphic with prominent nucleoli. There are scattered psammoma bodies. The ovaries are atrophic and exhibit multiple foci of surface based invasive carcinoma and associated fibrosis. The fallopian tubes have a few foci of carcinoma, also superficially located. There are no precursor lesions noted in the ovaries or fallopian tubes (the entire tubes and ovaries were submitted for evaluation). While there is some retraction artifact, there are several foci suspicious for lymphovascular invasion. 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 Given the fibrosis, there does appear to be a small amount of treatment effect, however, there is abundant residual tumor.    03/29/2014 Surgery   Procedure(s) Performed: 1. Exploratory laparotomy with bilateral salpingo-oophorectomy, omentectomy radical tumor debulking for ovarian cancer .   Surgeon: Thereasa Solo, MD.   Assistant Surgeon: Lahoma Crocker, M.D. Assistant: (an MD assistant was necessary for tissue manipulation, retraction and positioning due to the complexity of the case and hospital policies).  Operative Findings: 10cm omental cake from hepatic to splenic flexure, densely adherent to transverse colon. Milliary studding of tumor implants (<36m, too numerous in number to count) adherent to the mesentery of the small bowel and small bowel wall. Small volume (100cc) ascites. Small ovaries bilaterally, densely adherent to the pelvic cul de sac. Left ovary and tube densely adherent to sigmoid colon. Cirrhotic liver with hepatomegaly and splenomegaly.     This represented an optimal cytoreduction (R1) with visible disease residual on the bowel wall and mesentery (millial, <328mimplants).      04/18/2014 Tumor Marker   Patient's tumor was tested for the following markers: CA125 Results of the tumor  marker test revealed 122   05/16/2014 Tumor Marker   Patient's tumor was tested for the following markers: CA125 Results of the tumor marker test revealed 30   06/10/2014 Tumor Marker   Patient's tumor was tested for the following markers: CA125 Results of the tumor marker test revealed 19   07/04/2014 Imaging   Interval improvement in the appearance of peritoneal metastasis secondary to ovarian cancer. 2. 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     08/18/2014 Tumor Marker   Patient's tumor was tested for the following markers: CA125 Results of the tumor marker test revealed 16   10/13/2014 Tumor Marker   Patient's tumor was tested for the following markers: CA125 Results of the tumor marker test revealed 14   11/21/2014 Tumor Marker   Patient's tumor was tested for the following markers: CA125 Results of the tumor marker test revealed 16   12/28/2014 Tumor Marker   Patient's tumor was tested for the following markers: CA125 Results of the tumor marker test revealed 762   02/27/2015 Tumor Marker   Patient's tumor was tested for the following markers: CA125 Results of the tumor marker test revealed 20.2   03/22/2015 Imaging   Filler appearance to the prior exam, with very subtle fluid tracking along portions of the liver, and very minimal nodularity along the right paracolic gutter representing residua from the prior peritoneal cancer. The current abnormalities  could simply be post therapy findings rather than necessarily representing residual malignancy. No new or enlarging lesions are identified. 2. Hepatic cirrhosis and splenomegaly. There is some gastric varices suggesting portal venous hypertension. 3. Left foraminal stenosis at L4-5 due to spurring. There is likely also some central narrowing of the thecal sac at this level. 4. Bibasilar scarring. 5. Chronic mild left mid kidney scarring   03/27/2015 Tumor Marker   Patient's tumor was tested for the following  markers: CA125 Results of the tumor marker test revealed 25.3   04/24/2015 Imaging   Disc bulge L2-3 with mild spinal stenosis and right foraminal encroachment. 2. Disc bulge L3-4 with mild spinal stenosis. 3. Multifactorial spinal, left lateral recess and foraminal stenosis L4-5. There is grade 1 anterolisthesis without evident dynamic instability.   05/29/2015 Tumor Marker   Patient's tumor was tested for the following markers: CA125 Results of the tumor marker test revealed 29.9   08/21/2015 Tumor Marker   Patient's tumor was tested for the following markers: CA125 Results of the tumor marker test revealed 45   09/18/2015 Tumor Marker   Patient's tumor was tested for the following markers: CA125 Results of the tumor marker test revealed 47   09/27/2015 Imaging   New small bowel mesenteric nodule and enlarged left external iliac lymph node, highly worrisome for metastatic disease. 2. Cirrhosis and splenomegaly   10/10/2015 Tumor Marker   Patient's tumor was tested for the following markers: CA125 Results of the tumor marker test revealed 53.4   10/10/2015 - 12/11/2015 Chemotherapy   She received 4 cycles of carboplatin only   11/20/2015 Tumor Marker   Patient's tumor was tested for the following markers: CA125 Results of the tumor marker test revealed 41.3   12/20/2015 Imaging   CT: Mild left external iliac lymphadenopathy is slightly decreased. Mildly enlarged lower mesenteric nodule is slightly decreased. 2. No new or progressive metastatic disease in the abdomen or pelvis. 3. Cirrhosis. Stable subcentimeter hypodense left liver lobe lesion. No new liver lesions. 4. Stable mild splenomegaly.  No ascites. 5. Mild sigmoid diverticulosis.   01/01/2016 -  Anti-estrogen oral therapy   She was placed on tamoxifen. Had letrozole initially but switched to tamoxifen due to poor tolerance   01/15/2016 Tumor Marker   Patient's tumor was tested for the following markers: CA125 Results of the  tumor marker test revealed 31.4   02/19/2016 Tumor Marker   Patient's tumor was tested for the following markers: CA125 Results of the tumor marker test revealed 36.5   05/13/2016 Imaging   No evidence of disease progression within the abdomen or pelvis. Previously noted small central mesenteric nodule and left external iliac node appear slightly smaller. 2. Stable changes of hepatic cirrhosis and portal hypertension with associated splenomegaly. No new or enlarging hepatic lesions are identified. 3. No acute findings.   05/13/2016 Tumor Marker   Patient's tumor was tested for the following markers: CA125 Results of the tumor marker test revealed 41.2   06/24/2016 Tumor Marker   Patient's tumor was tested for the following markers: CA125 Results of the tumor marker test revealed 47.3   08/20/2016 Imaging   1. The left external iliac lymph node is slightly increased in size compared to the prior exam, currently 1.1 cm and previously 0.9 cm in diameter. 2. Stable central mesenteric lymph node at 0.9 cm in diameter. 3. Stable trace free pelvic fluid and stable slight thickening along the right paracolic gutter, without well-defined peritoneal nodularity. 4. Other imaging findings of  potential clinical significance: Subsegmental atelectasis or scarring in the lung bases. Hepatic cirrhosis. Left renal scarring. Mild splenomegaly. Sigmoid colon diverticulosis. Impingement at L4-5 due to spondylosis and degenerative disc disease broad Schmorl' s nodes at L3-L4. Pelvic floor laxity   08/23/2016 Tumor Marker   Patient's tumor was tested for the following markers: CA125 Results of the tumor marker test revealed 80.2   09/04/2016 Imaging   LV EF: 60% -   65%   09/12/2016 Tumor Marker   Patient's tumor was tested for the following markers: CA125 Results of the tumor marker test revealed 98.5   09/12/2016 - 01/29/2018 Chemotherapy   The patient received Doxil and Avastin. Avastin was discontinued in Dec 2019  due to GI hemorrhage   09/26/2016 Tumor Marker   Patient's tumor was tested for the following markers: CA125 Results of the tumor marker test revealed 68.9   10/10/2016 Tumor Marker   Patient's tumor was tested for the following markers: CA125 Results of the tumor marker test revealed 65.6   11/07/2016 Tumor Marker   Patient's tumor was tested for the following markers: CA125 Results of the tumor marker test revealed 46.4   11/29/2016 Imaging   Stable mild peritoneal thickening, suspicious for peritoneal carcinomatosis. No new or progressive disease identified. No evidence of ascites.  No significant change in 10 mm left external iliac and 8 mm mesenteric lymph nodes.  Stable hepatic cirrhosis, and splenomegaly consistent with portal venous hypertension.  Colonic diverticulosis, without radiographic evidence of diverticulitis.   12/02/2016 Imaging   Normal LV size with EF 55%. Basal inferior and basal inferoseptal hypokinesis. Normal RV size and systolic function. No significant valvular abnormalities.   12/05/2016 Tumor Marker   Patient's tumor was tested for the following markers: CA125 Results of the tumor marker test revealed 49.1   01/09/2017 Tumor Marker   Patient's tumor was tested for the following markers: CA125 Results of the tumor marker test revealed 47.2   02/06/2017 Tumor Marker   Patient's tumor was tested for the following markers: CA125 Results of the tumor marker test revealed 44.1   02/18/2017 Imaging   Stable mild peritoneal thickening. No new or progressive disease identified within the abdomen or pelvis.  Stable tiny sub-cm left external iliac and mesenteric lymph nodes.  Stable hepatic cirrhosis, and splenomegaly consistent with portal venous hypertension. No evidence of hepatic neoplasm.  Colonic diverticulosis, without radiographic evidence of diverticulitis.   03/06/2017 Tumor Marker   Patient's tumor was tested for the following markers:  CA125 Results of the tumor marker test revealed 50.4   03/17/2017 Imaging   - Left ventricle: The cavity size was normal. Wall thickness was normal. Systolic function was normal. The estimated ejection fraction was in the range of 55% to 60%. Wall motion was normal; there were no regional wall motion abnormalities. Features are consistent with a pseudonormal left ventricular filling pattern, with concomitant abnormal relaxation and increased filling pressure (grade 2 diastolic dysfunction). - Mitral valve: There was mild regurgitation. - Left atrium: The atrium was mildly dilated   04/03/2017 Tumor Marker   Patient's tumor was tested for the following markers: CA125 Results of the tumor marker test revealed 47.2   05/14/2017 Imaging   CT scan of abdomen and pelvis Stable mild peritoneal thickening. No new or progressive disease within the abdomen or pelvis.  Stable hepatic cirrhosis. Stable splenomegaly, consistent with portal venous hypertension. No evidence of hepatic neoplasm.   05/15/2017 Tumor Marker   Patient's tumor was tested for the following  markers: CA125 Results of the tumor marker test revealed 56.1   06/12/2017 Tumor Marker   Patient's tumor was tested for the following markers: CA125 Results of the tumor marker test revealed 49.2   07/10/2017 Tumor Marker   Patient's tumor was tested for the following markers: CA125 Results of the tumor marker test revealed 46.3   08/07/2017 Tumor Marker   Patient's tumor was tested for the following markers: CA125 Results of the tumor marker test revealed 52.9   08/20/2017 Imaging   1. Mild omental/peritoneal haziness, unchanged. No evidence of new metastatic disease. 2. Cirrhosis with splenomegaly.   09/04/2017 Tumor Marker   Patient's tumor was tested for the following markers: CA125 Results of the tumor marker test revealed 48.5   11/13/2017 Tumor Marker   Patient's tumor was tested for the following markers: CA125 Results of the  tumor marker test revealed 55.8   12/17/2017 Imaging   1. No findings to suggest metastatic disease in the abdomen or pelvis. 2. Severe hepatic cirrhosis with evidence of portal hypertension, as demonstrated by dilated portal vein, splenomegaly and portosystemic collateral pathways, including gastric and esophageal varices.  3. Colonic diverticulosis without evidence of acute diverticulitis at this time. 4. Additional incidental findings, as above.   01/16/2018 Tumor Marker   Patient's tumor was tested for the following markers: CA125 Results of the tumor marker test revealed 63.7   02/10/2018 Procedure   EGD - Recently bleeding grade III and large (> 5 mm) esophageal varices. Completely eradicated. Banded. - Portal hypertensive gastropathy. - Normal examined duodenum. - No specimens collected   02/13/2018 Tumor Marker   Patient's tumor was tested for the following markers: CA125 Results of the tumor marker test revealed 82.7   02/25/2018 Imaging   Bone density showed mild osteopenia   04/08/2018 Tumor Marker   Patient's tumor was tested for the following markers: CA125 Results of the tumor marker test revealed 82.7   04/08/2018 Imaging   1. No definite omental or peritoneal surface lesions. However, there are 2 slowly enlarging lymph nodes noted. One is in the small bowel mesentery and the other is in the left deep pelvis. Could not exclude recurrent disease. 2. Stable advanced cirrhotic changes involving the liver with portal venous hypertension, portal venous collaterals, esophageal varices and splenomegaly. No worrisome hepatic lesions. 3. Diffuse wall thickening of the colon could suggest diffuse colitis or could be due to low albumin. Recommend correlation with any symptoms such as diarrhea.   05/21/2018 Tumor Marker   Patient's tumor was tested for the following markers: CA125 Results of the tumor marker test revealed 86   09/07/2018 Tumor Marker   Patient's tumor was tested  for the following markers:CA-125 Results of the tumor marker test revealed 111   09/29/2018 Tumor Marker   Patient's tumor was tested for the following markers: CA-125 Results of the tumor marker test revealed 121.   09/29/2018 - 11/24/2018 Chemotherapy   The patient had carboplatin and gemzar for chemotherapy treatment.  Chemo was stopped due to allergic reaction to carboplatin   10/27/2018 Tumor Marker   Patient's tumor was tested for the following markers: CA-125 Results of the tumor marker test revealed 99.9   11/24/2018 Tumor Marker   Patient's tumor was tested for the following markers: CA-125 Results of the tumor marker test revealed 81.2   12/14/2018 Tumor Marker   Patient's tumor was tested for the following markers: CA-125 Results of the tumor marker test revealed 81.2   12/14/2018 Imaging  1. Mild improvement in peritoneal disease. Peritoneal nodule within the left posterior pelvis contiguous with the left side of vaginal cuff is slightly decreased in size in the interval. Within the right iliac fossa there is a small peritoneal nodule which has decreased in size in the interval. Central small bowel mesenteric lymph node is not significantly changed. Slight decrease in size left periaortic lymph node.    2. Morphologic features of the liver compatible with cirrhosis. Splenomegaly is noted which may reflect portal venous hypertension.   12/21/2018 Tumor Marker   Patient's tumor was tested for the following markers: CA-125 Results of the tumor marker test revealed 64.2   12/21/2018 -  Chemotherapy   The patient had taxotere for chemotherapy treatment.     03/02/2019 Tumor Marker   Patient's tumor was tested for the following markers: CA-125. 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   No new or progressive metastatic disease within the abdomen or pelvis.   Hepatic  cirrhosis and findings of portal venous hypertension. 1.1 cm low-attenuation lesion within left hepatic lobe, which could represent a regenerative nodule or small hepatocellular carcinoma. Recommend abdomen MRI without and with contrast for further characterization.     04/13/2019 Tumor Marker   Patient's tumor was tested for the following markers: CA-125 Results of the tumor marker test revealed 36.8   05/26/2019 Tumor Marker   Patient's tumor was tested for the following markers: CA-125 Results of the tumor marker test revealed 31.6.   06/15/2019 Tumor Marker   Patient's tumor was tested for the following markers: CA-125 Results of the tumor marker test revealed 25.4   07/05/2019 Imaging   1. Stable CT examination. Stable peritoneal thickening in the pelvis and paracolic gutters. Stable solid left deep pelvic implant. Stable trace ascites. No new or progressive metastatic disease. 2. Stable cirrhosis. No discrete liver masses. 3. Stable mild-to-moderate splenomegaly. 4. Aortic Atherosclerosis (ICD10-I70.0).   07/27/2019 Tumor Marker   Patient's tumor was tested for the following markers: CA-125 Results of the tumor marker test revealed 35.4   08/17/2019 Tumor Marker   Patient's tumor was tested for the following markers: Ca-125 Results of the tumor marker test revealed 35.7.   09/17/2019 - 09/21/2019 Hospital Admission   She was admitted due to esophageal variceal bleeding presents with hematemesis and melanotic stool with epigastric pain.  GI consulted, started on octreotide infusion.  Underwent EGD which showed minimal bleeding, remain on octreotide drip, PPI drip.  Hemoglobin trended down therefore received PRBC transfusion.  Over several days her bleeding subsided, hemodynamically remained stable.    09/17/2019 Procedure   EGD report - Esophageal mucosal scarring, from prior esophageal banding sessions. - Grade I esophageal varices. - Red blood in the cardia and in the gastric fundus. -  Portal hypertensive gastropathy. - Normal duodenal bulb, first portion of the duodenum and second portion of the duodenum. 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 2. Otherwise stable CT exam. Stable peritoneal thickening within the bilateral deep pelvic peritoneum. Stable left deep pelvic implant.No evidence for progressed disease. 3. Stable splenomegaly. 4. Morphologic  changes to the liver compatible with cirrhosis. Trace perihepatic and pelvic ascites.   02/22/2020 Imaging   1. New lucent lesion in the left side of the L4 vertebral body measuring 1 cm, concerning for possible osseous metastasis. Consideration for further evaluation with bone scan is suggested. Close attention on follow-up imaging is also recommended to ensure stability. 2. Previously noted peritoneal thickening and nodularity in the low anatomic pelvis has regressed slightly. 3. Borderline enlarged and mildly enlarged pelvic and retroperitoneal lymph nodes appear similar to slightly increased compared to the prior examination, as above. Continued attention on follow-up studies is recommended.  4. 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 5. Aortic atherosclerosis. 6. Additional incidental findings, as above.   02/22/2020 Tumor Marker   Patient's tumor was tested for the following markers: CA-125 Results of the tumor marker test revealed 30.1   04/06/2020 Tumor Marker   Patient's tumor was tested for the following markers: CA-125 Results of the tumor marker test  revealed 35.7   09/11/2020 Tumor Marker   Patient's tumor was tested for the following markers: CA-125. Results of the tumor marker test revealed 26.6.   09/12/2020 Imaging   1. Slow enlargement of a currently 2.9 cubic cm soft tissue nodule along the left adnexa. Tumor deposit not excluded. 2. The left external iliac lymph node has substantially reduced in size, currently 0.9 cm in short axis and formerly 1.4 cm. 3. 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 4. Cirrhosis with portal venous hypertension. 5. Mild distal esophageal wall thickening, probably from esophagitis. Splenomegaly. 6. Scarring in the left kidney. 7. Sigmoid colon diverticulosis. 8. Pelvic floor laxity with small cystocele. 9. Lumbar impingement at L2-3, L3-4, and L4-5.     10/23/2020 Tumor Marker   Patient's tumor was tested for the following markers: CA-125. Results of the tumor marker test revealed 35.3.   12/12/2020 Tumor Marker   Patient's tumor was tested for the following markers: CA-125. Results of the tumor marker test revealed 39.1.   12/13/2020 Imaging   1. Continued increase in size of soft tissue nodule within the left posterior pelvis adjacent to the sigmoid colon. Cannot exclude small tumor deposit. 2. Similar appearance of thickening and mild nodularity along the peritoneal reflections of the pelvis and pericolic gutters. 3. Morphologic findings of the liver compatible with cirrhosis. 4. Splenomegaly. 5. Aortic Atherosclerosis (ICD10-I70.0).   01/24/2021 Tumor Marker   Patient's tumor was tested for the following markers: CA-125. Results of the tumor marker test revealed 56.2.   02/09/2021 Imaging   1. Mild interval increased size of the solid left adnexal mass which may represent tumor recurrence/deposit. The mass abuts the adjacent sigmoid colon as well as the left posterior wall  of the urinary bladder, invasion not excluded. 2. Otherwise no new mass or lymphadenopathy identified in the abdomen or pelvis. 3. Hepatic cirrhosis. Splenomegaly consistent with portal hypertension.   04/11/2021 Imaging   1. Left posterior pelvic soft tissue mass, adjacent to the sigmoid colon, has slightly increased in size in comparison to recent prior with significant interval increase in size over more remote priors, findings which are highly suspicious for active neoplastic disease involvement. 2. No new discrete omental or peritoneal nodules/masses identified. 3. Similar nodular thickening of the peritoneum along the bilateral pericolic gutters and in the pelvis without ascites. 4. No pathologically enlarged abdominal or pelvic lymph nodes and no  solid organ metastases visualized. 5. Cirrhotic hepatic morphology with evidence of portal hypertension including splenomegaly. 6. Sigmoid colonic diverticulosis without findings of acute diverticulitis. 7. Moderate volume of formed stool throughout the colon. 8. Aortic Atherosclerosis (ICD10-I70.0).   04/11/2021 Tumor Marker   Patient's tumor was tested for the following markers: CA-125. Results of the tumor marker test revealed 79.4 .   04/23/2021 - 10/09/2021 Chemotherapy   Patient is on Treatment Plan : Ovarian Docetaxel q21d     04/24/2021 Tumor Marker   Patient's tumor was tested for the following markers: CA-125. Results of the tumor marker test revealed 68.5.   05/16/2021 Tumor Marker   Patient's tumor was tested for the following markers: CA-125. Results of the tumor marker test revealed 68.1.   06/28/2021 Tumor Marker   Patient's tumor was tested for the following markers: CA-125. Results of the tumor marker test revealed 52.   07/16/2021 Imaging   1. Stable cirrhotic changes involving the liver. Associated portal venous hypertension, portal venous collaterals and splenomegaly. No hepatic lesions. 2. Slight interval decrease in size  of the left pelvic implant. 3. Stable minimal peritoneal nodularity involving the pericolic gutters bilaterally. No new or progressive findings.     08/09/2021 Tumor Marker   Patient's tumor was tested for the following markers: CA-125. Results of the tumor marker test revealed 44.1.   09/20/2021 Tumor Marker   Patient's tumor was tested for the following markers: CA-125. Results of the tumor marker test revealed 38.2.   10/31/2021 Imaging   1. Stable exam. No new or progressive interval findings. 2. Stable appearance of the soft tissue nodule in the left pelvis. 3. Subtle omental nodule is similar to prior. The minimal peritoneal nodularity described previously in the para colic gutters is similar to prior. 4. Cirrhosis with splenomegaly. 5. Cholelithiasis. 6. Left colonic diverticulosis without diverticulitis. 7. Aortic Atherosclerosis (ICD10-I70.0).   10/31/2021 Tumor Marker   Patient's tumor was tested for the following markers: Ca-125. Results of the tumor marker test revealed 37.7.   11/01/2021 - 11/01/2021 Chemotherapy   Patient is on Treatment Plan : BREAST Docetaxel (77) q21d     11/18/2021 -  Chemotherapy   She started on Niraparib   12/03/2021 Tumor Marker   Patient's tumor was tested for the following markers: CA-125. Results of the tumor marker test revealed 45.5.   12/27/2021 Tumor Marker   Patient's tumor was tested for the following markers: CA-125. Results of the tumor marker test revealed 48.3.   01/31/2022 Tumor Marker   Patient's tumor was tested for the following markers: CA-`125. Results of the tumor marker test revealed 43.6.   02/26/2022 Imaging   Stable soft tissue nodule in the low left hemipelvis in the surgical bed of the previous hysterectomy. Additional small areas of thickening along the abdomen without new ascites. There is 1 small asymmetric area of increased thickening along the posterolateral left hemipelvis as above. Attention to this area on  short follow-up.   Nodular liver with fatty infiltration splenomegaly.   Gallstones.   Aortic atherosclerosis ICD10-I 70.0     PHYSICAL EXAMINATION: ECOG PERFORMANCE STATUS: 1 - Symptomatic but completely ambulatory  Vitals:   03/28/22 0808  BP: 127/62  Pulse: 76  Resp: 18  Temp: 97.7 F (36.5 C)  SpO2: 97%   Filed Weights   03/28/22 0808  Weight: 177 lb 12.8 oz (80.6 kg)    GENERAL:alert, no distress and comfortable NEURO: alert & oriented x 3 with fluent speech, no focal motor/sensory deficits  LABORATORY DATA:  I have reviewed the data as listed    Component Value Date/Time   NA 139 03/28/2022 0737   NA 142 02/06/2017 0742   K 4.1 03/28/2022 0737   K 3.9 02/06/2017 0742   CL 107 03/28/2022 0737   CO2 24 03/28/2022 0737   CO2 22 02/06/2017 0742   GLUCOSE 115 (H) 03/28/2022 0737   GLUCOSE 156 (H) 02/06/2017 0742   BUN 20 03/28/2022 0737   BUN 13.5 02/06/2017 0742   CREATININE 0.93 03/28/2022 0737   CREATININE 0.8 02/06/2017 0742   CALCIUM 8.6 (L) 03/28/2022 0737   CALCIUM 8.5 02/06/2017 0742   PROT 7.6 03/28/2022 0737   PROT 6.9 02/06/2017 0742   ALBUMIN 4.2 03/28/2022 0737   ALBUMIN 3.7 02/06/2017 0742   AST 25 03/28/2022 0737   AST 47 (H) 02/06/2017 0742   ALT 22 03/28/2022 0737   ALT 54 02/06/2017 0742   ALKPHOS 106 03/28/2022 0737   ALKPHOS 130 02/06/2017 0742   BILITOT 0.7 03/28/2022 0737   BILITOT 0.65 02/06/2017 0742   GFRNONAA >60 03/28/2022 0737   GFRAA >60 11/09/2019 0953   GFRAA >60 10/26/2019 0722    No results found for: "SPEP", "UPEP"  Lab Results  Component Value Date   WBC 3.9 (L) 03/28/2022   NEUTROABS 2.7 03/28/2022   HGB 12.5 03/28/2022   HCT 36.5 03/28/2022   MCV 94.3 03/28/2022   PLT 90 (L) 03/28/2022      Chemistry      Component Value Date/Time   NA 139 03/28/2022 0737   NA 142 02/06/2017 0742   K 4.1 03/28/2022 0737   K 3.9 02/06/2017 0742   CL 107 03/28/2022 0737   CO2 24 03/28/2022 0737   CO2 22  02/06/2017 0742   BUN 20 03/28/2022 0737   BUN 13.5 02/06/2017 0742   CREATININE 0.93 03/28/2022 0737   CREATININE 0.8 02/06/2017 0742      Component Value Date/Time   CALCIUM 8.6 (L) 03/28/2022 0737   CALCIUM 8.5 02/06/2017 0742   ALKPHOS 106 03/28/2022 0737   ALKPHOS 130 02/06/2017 0742   AST 25 03/28/2022 0737   AST 47 (H) 02/06/2017 0742   ALT 22 03/28/2022 0737   ALT 54 02/06/2017 0742   BILITOT 0.7 03/28/2022 0737   BILITOT 0.65 02/06/2017 0742

## 2022-03-28 NOTE — Assessment & Plan Note (Signed)
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 

## 2022-03-28 NOTE — Assessment & Plan Note (Signed)
Her last imaging study from January showed stable disease Even though she has clinically measurable disease, she have no evidence of disease progression or new lesions Overall, she tolerated maintenance Zejula well even at reduced dose I recommend we continue treatment indefinitely for now I plan to repeat imaging study again in 3 to 4 months, probably due around end of April or early May

## 2022-03-30 LAB — CA 125: Cancer Antigen (CA) 125: 49.7 U/mL — ABNORMAL HIGH (ref 0.0–38.1)

## 2022-04-22 ENCOUNTER — Other Ambulatory Visit (HOSPITAL_COMMUNITY): Payer: Self-pay

## 2022-04-26 ENCOUNTER — Other Ambulatory Visit: Payer: Self-pay

## 2022-05-03 ENCOUNTER — Other Ambulatory Visit: Payer: Self-pay

## 2022-05-14 ENCOUNTER — Telehealth: Payer: Self-pay

## 2022-05-14 ENCOUNTER — Other Ambulatory Visit: Payer: Self-pay | Admitting: Hematology and Oncology

## 2022-05-14 MED ORDER — HYDROCODONE-ACETAMINOPHEN 5-325 MG PO TABS: 1.0000 | ORAL_TABLET | Freq: Four times a day (QID) | ORAL | 0 refills | Status: DC | PRN

## 2022-05-14 NOTE — Telephone Encounter (Signed)
Called and told her Dr. Alvy Bimler sent Hydrocodone Rx to pharmacy. She verbalized understanding.

## 2022-05-21 ENCOUNTER — Other Ambulatory Visit: Payer: Self-pay

## 2022-05-23 ENCOUNTER — Inpatient Hospital Stay: Payer: Medicare Other | Attending: Hematology and Oncology

## 2022-05-23 ENCOUNTER — Encounter: Payer: Self-pay | Admitting: Hematology and Oncology

## 2022-05-23 ENCOUNTER — Inpatient Hospital Stay (HOSPITAL_BASED_OUTPATIENT_CLINIC_OR_DEPARTMENT_OTHER): Payer: Medicare Other | Admitting: Hematology and Oncology

## 2022-05-23 ENCOUNTER — Telehealth: Payer: Self-pay | Admitting: Hematology and Oncology

## 2022-05-23 VITALS — BP 123/54 | HR 75 | Temp 97.5°F | Resp 18 | Ht 62.0 in | Wt 178.4 lb

## 2022-05-23 DIAGNOSIS — C482 Malignant neoplasm of peritoneum, unspecified: Secondary | ICD-10-CM

## 2022-05-23 DIAGNOSIS — D61818 Other pancytopenia: Secondary | ICD-10-CM

## 2022-05-23 DIAGNOSIS — R161 Splenomegaly, not elsewhere classified: Secondary | ICD-10-CM | POA: Diagnosis not present

## 2022-05-23 DIAGNOSIS — Z5111 Encounter for antineoplastic chemotherapy: Secondary | ICD-10-CM

## 2022-05-23 LAB — COMPREHENSIVE METABOLIC PANEL
ALT: 19 U/L (ref 0–44)
AST: 21 U/L (ref 15–41)
Albumin: 4.1 g/dL (ref 3.5–5.0)
Alkaline Phosphatase: 109 U/L (ref 38–126)
Anion gap: 8 (ref 5–15)
BUN: 19 mg/dL (ref 8–23)
CO2: 25 mmol/L (ref 22–32)
Calcium: 8.6 mg/dL — ABNORMAL LOW (ref 8.9–10.3)
Chloride: 109 mmol/L (ref 98–111)
Creatinine, Ser: 0.99 mg/dL (ref 0.44–1.00)
GFR, Estimated: 60 mL/min — ABNORMAL LOW (ref >60)
Glucose, Bld: 98 mg/dL (ref 70–99)
Potassium: 4 mmol/L (ref 3.5–5.1)
Sodium: 142 mmol/L (ref 135–145)
Total Bilirubin: 0.6 mg/dL (ref 0.3–1.2)
Total Protein: 7.3 g/dL (ref 6.5–8.1)

## 2022-05-23 LAB — CBC WITH DIFFERENTIAL/PLATELET
Abs Immature Granulocytes: 0.01 10*3/uL (ref 0.00–0.07)
Basophils Absolute: 0 10*3/uL (ref 0.0–0.1)
Basophils Relative: 0 %
Eosinophils Absolute: 0.1 10*3/uL (ref 0.0–0.5)
Eosinophils Relative: 3 %
HCT: 36.1 % (ref 36.0–46.0)
Hemoglobin: 12.2 g/dL (ref 12.0–15.0)
Immature Granulocytes: 0 %
Lymphocytes Relative: 24 %
Lymphs Abs: 0.9 10*3/uL (ref 0.7–4.0)
MCH: 32.4 pg (ref 26.0–34.0)
MCHC: 33.8 g/dL (ref 30.0–36.0)
MCV: 96 fL (ref 80.0–100.0)
Monocytes Absolute: 0.2 10*3/uL (ref 0.1–1.0)
Monocytes Relative: 6 %
Neutro Abs: 2.4 10*3/uL (ref 1.7–7.7)
Neutrophils Relative %: 67 %
Platelets: 83 10*3/uL — ABNORMAL LOW (ref 150–400)
RBC: 3.76 MIL/uL — ABNORMAL LOW (ref 3.87–5.11)
RDW: 13.8 % (ref 11.5–15.5)
WBC: 3.6 10*3/uL — ABNORMAL LOW (ref 4.0–10.5)
nRBC: 0 % (ref 0.0–0.2)

## 2022-05-23 MED ORDER — HEPARIN SOD (PORK) LOCK FLUSH 100 UNIT/ML IV SOLN
500.0000 [IU] | Freq: Once | INTRAVENOUS | Status: AC
  Administered 2022-05-23: 500 [IU]

## 2022-05-23 MED ORDER — SODIUM CHLORIDE 0.9% FLUSH
10.0000 mL | Freq: Once | INTRAVENOUS | Status: AC
  Administered 2022-05-23: 10 mL

## 2022-05-23 NOTE — Progress Notes (Signed)
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[facility]) Her last imaging study from January showed stable disease Even though she has clinically measurable disease, she have no evidence of disease progression or new lesions Overall, she tolerated maintenance Zejula well even at reduced dose I recommend we continue treatment indefinitely for now I plan to repeat imaging study again in May  Pancytopenia, acquired [facility]) 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    Answer:   [facility]    Order Specific Question:   Radiology Contrast Protocol - do NOT remove file path    Answer:   [email or web address]    All questions were answered. The patient knows to call the clinic with any problems, questions or concerns. The total time spent in the appointment was 20 minutes encounter with patients including review of chart and various tests results, discussions about plan of care and coordination of care plan   Artis Delay, MD 05/23/2022 11:00 AM  INTERVAL HISTORY: Please see below for problem oriented charting. she returns for treatment follow-up on maintenance niraparib She tolerated  treatment well She has occasional fatigue The patient denies any recent signs or symptoms of bleeding such as spontaneous epistaxis, hematuria or hematochezia.   REVIEW OF SYSTEMS:   Constitutional: Denies fevers, chills or abnormal weight loss Eyes: Denies blurriness of vision Ears, nose, mouth, throat, and face: Denies mucositis or sore throat Respiratory: Denies cough, dyspnea or wheezes Cardiovascular: Denies palpitation, chest discomfort or lower extremity swelling Gastrointestinal:  Denies nausea, heartburn or change in bowel habits Skin: Denies abnormal skin rashes Lymphatics: Denies new lymphadenopathy or easy bruising Neurological:Denies numbness, tingling or new weaknesses Behavioral/Psych: Mood is stable, no new changes  All other systems were reviewed with the patient and are negative.  I have reviewed the past medical history, past surgical history, social history and family history with the patient and they are unchanged from previous note.  ALLERGIES:  is allergic to carboplatin, shellfish allergy, vancomycin, benadryl [diphenhydramine hcl], penicillins, and tape.  MEDICATIONS:  Current Outpatient Medications  Medication Sig Dispense Refill   calcium elemental as carbonate (BARIATRIC TUMS ULTRA) 400 MG chewable tablet Chew 1 tablet by mouth 2 (two) times daily.     Cholecalciferol (VITAMIN D) 50 MCG (2000 UT) tablet Take 2,000 Units by mouth at bedtime.     gabapentin (NEURONTIN) 300 MG capsule TAKE 3 CAPSULES BY MOUTH TWICE DAILY 120 capsule 11   HYDROcodone-acetaminophen (NORCO/VICODIN) 5-325 MG tablet Take 1 tablet by mouth every 6 (six) hours as needed for moderate pain. 60 tablet 0   insulin degludec (TRESIBA FLEXTOUCH) 100 UNIT/ML FlexTouch Pen Inject 28 Units into the skin daily.  lidocaine-prilocaine (EMLA) cream Apply to Porta-cath  1-2 hours prior to access as directed. (Patient taking differently: Apply 1 application  topically daily as needed (port  access).) 30 g 9   NEUPRO 4 MG/24HR APPLY 1 PATCH TOPICALLY TO THE SKIN DAILY 90 patch 0   niraparib tosylate (ZEJULA) 100 MG tablet Take 1 tablet (100 mg total) by mouth daily. 30 tablet 9   ondansetron (ZOFRAN) 8 MG tablet TAKE 1 TABLET(8 MG) BY MOUTH EVERY 8 HOURS AS NEEDED 60 tablet 1   OZEMPIC, 0.25 OR 0.5 MG/DOSE, 2 MG/1.5ML SOPN Inject 0.25 mg into the skin every Saturday.     pantoprazole (PROTONIX) 40 MG tablet Take 1 tablet (40 mg total) by mouth daily.     No current facility-administered medications for this visit.    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 Had allergic reaction to carboplatin. Avastin was stopped due to GI hemorrhage   Primary peritoneal carcinomatosis  09/06/2011 Imaging   CT findings consistent with cirrhotic changes involving the liver.  No worrisome liver mass.  There are associated portal venous collaterals and splenomegaly consistent with portal venous hypertension. 2.  No other significant upper abdominal findings   11/08/2013 Imaging   US showed ascites   11/08/2013 Initial Diagnosis   Patient had ~ 2 months of some urinary incontinence, saw Dr Billy Coast in 10-2013, with pelvic mass on exam    11/12/2013 Imaging   There are findings of progressive hepatic cirrhosis with a large amount of ascites and mild splenomegaly. There are venous collaterals present. 2. There is abnormal thickening of the peritoneal surface along the left mid and lower abdominal wall. There is abnormal soft tissue density in the pelvis as well which could reflect the clinically suspected ovarian malignancy but the findings are nondiagnostic. 3. There is a new small left pleural effusion. 4. There is no acute bowel abnormality.   11/18/2013 Imaging   Successful ultrasound guided diagnostic and therapeutic paracentesis yielding 4.1 liters of ascites.     11/18/2013 Pathology Results    PERITONEAL/ASCITIC FLUID (SPECIMEN 1 OF 1 COLLECTED 11/18/13): SEROUS CARCINOMA, PLEASE SEE COMMENT.   12/01/2013 Tumor Marker   Patient's tumor was tested for the following markers: CA125 Results of the tumor marker test revealed 1938   12/03/2013 Imaging   Successful ultrasound-guided therapeutic paracentesis yielding 3.6 liters of peritoneal fluid.   12/07/2013 - 05/30/2014 Chemotherapy   She received 3 cycles of carboplatin and Taxol, interrupted cycle 4 for surgery.  She subsequently completed 3 more cycles of chemotherapy after surgery   12/20/2013 Imaging   Successful ultrasound-guided diagnostic and therapeutic paracentesis yielding 2.2 liters of peritoneal fluid   12/20/2013 Pathology Results   PERITONEAL/ASCITIC FLUID(SPECIMEN 1 OF 1 COLLECTED 12/20/13): MALIGNANT CELLS CONSISTENT WITH SEROUS CARCINOMA   01/13/2014 Tumor Marker   Patient's tumor was tested for the following markers: CA125 Results of the tumor marker test revealed 227   02/07/2014 Tumor Marker   Patient's tumor was tested for the following markers: CA125 Results of the tumor marker test revealed 130   02/15/2014 Genetic Testing   Genetics testing from 02-2014 normal ([employer]va[employer] panel)   02/23/2014 Imaging   Interval decrease in ascites. 2. Morphologic changes in the liver consistent with cirrhosis. 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 Portal vein remains patent at this time. 3. Persistent but decreased abnormal soft tissue attenuation tracking in the omental fat. This may be  secondary to interval improvement in metastatic disease. 4. Peritoneal thickening seen in the left abdomen and pelvis on the previous study has decreased and nearly resolved in the interval. This also suggests interval improvement in metastatic disease.     03/07/2014 Procedure   Ultrasound and fluoroscopically guided right internal jugular single lumen power port catheter insertion. Tip in the SVC/RA  junction. Catheter ready for use.   03/17/2014 Tumor Marker   Patient's tumor was tested for the following markers: CA125 Results of the tumor marker test revealed 45   03/29/2014 Pathology Results   1. Omentum, resection for tumor - HIGH GRADE SEROUS CARCINOMA, SEE COMMENT. 2. Ovary and fallopian tube, right - HIGH GRADE SEROUS CARCINOMA, SEE COMMENT. 3. Ovary and fallopian tube, left - HIGH GRADE SEROUS CARCINOMA, SEE COMMENT. Diagnosis Note 1. Nests and clusters of malignant cells are invading the omental tissue (part #1) with associated fibrosis. The cells are pleomorphic with prominent nucleoli. There are scattered psammoma bodies. The ovaries are atrophic and exhibit multiple foci of surface based invasive carcinoma and associated fibrosis. The fallopian tubes have a few foci of carcinoma, also superficially located. There are no precursor lesions noted in the ovaries or fallopian tubes (the entire tubes and ovaries were submitted for evaluation). While there is some retraction artifact, there are several foci suspicious for lymphovascular invasion. 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 Given the fibrosis, there does appear to be a small amount of treatment effect, however, there is abundant residual tumor.    03/29/2014 Surgery   Procedure(s) Performed: 1. Exploratory laparotomy with bilateral salpingo-oophorectomy, omentectomy radical tumor debulking for ovarian cancer .   Surgeon: Luisa Dago, MD.   Assistant Surgeon: Antionette Char, M.D. Assistant: (an MD assistant was necessary for tissue manipulation, retraction and positioning due to the complexity of the case and hospital policies).  Operative Findings: 10cm omental cake from hepatic to splenic flexure, densely adherent to transverse colon. Milliary studding of tumor implants (<8mm, too  numerous in number to count) adherent to the mesentery of the small bowel and small bowel wall. Small volume (100cc) ascites. Small ovaries bilaterally, densely adherent to the pelvic cul de sac. Left ovary and tube densely adherent to sigmoid colon. Cirrhotic liver with hepatomegaly and splenomegaly.     This represented an optimal cytoreduction (R1) with visible disease residual on the bowel wall and mesentery (millial, <5mm implants).      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 2. 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     08/18/2014 Tumor Marker   Patient's tumor was tested for the following markers: CA125 Results of the tumor marker test revealed 16   10/13/2014 Tumor Marker   Patient's tumor was tested for the following markers: CA125 Results of the tumor marker test revealed 14   11/21/2014 Tumor Marker   Patient's tumor was tested for the following markers:  CA125 Results of the tumor marker test revealed 16   12/28/2014 Tumor Marker   Patient's tumor was tested for the following markers: CA125 Results of the tumor marker test revealed 762   02/27/2015 Tumor Marker   Patient's tumor was tested for the following markers: CA125 Results of the tumor marker test revealed 20.2   03/22/2015 Imaging   Filler appearance to the prior exam, with very subtle fluid tracking along portions of the liver, and very minimal nodularity along the right paracolic gutter representing residua from the prior  peritoneal cancer. The current abnormalities could simply be post therapy findings rather than necessarily representing residual malignancy. No new or enlarging lesions are identified. 2. Hepatic cirrhosis and splenomegaly. There is some gastric varices suggesting portal venous hypertension. 3. Left foraminal stenosis at L4-5 due to spurring. There is likely also some central narrowing of the thecal sac at this level. 4. Bibasilar scarring. 5. Chronic mild left mid kidney scarring   03/27/2015 Tumor Marker   Patient's tumor was tested for the following markers: CA125 Results of the tumor marker test revealed 25.3   04/24/2015 Imaging   Disc bulge L2-3 with mild spinal stenosis and right foraminal encroachment. 2. Disc bulge L3-4 with mild spinal stenosis. 3. Multifactorial spinal, left lateral recess and foraminal stenosis L4-5. There is grade 1 anterolisthesis without evident dynamic instability.   05/29/2015 Tumor Marker   Patient's tumor was tested for the following markers: CA125 Results of the tumor marker test revealed 29.9   08/21/2015 Tumor Marker   Patient's tumor was tested for the following markers: CA125 Results of the tumor marker test revealed 45   09/18/2015 Tumor Marker   Patient's tumor was tested for the following markers: CA125 Results of the tumor marker test revealed 47   09/27/2015 Imaging   New small bowel mesenteric nodule and enlarged left external iliac lymph node, highly worrisome for metastatic disease. 2. Cirrhosis and splenomegaly   10/10/2015 Tumor Marker   Patient's tumor was tested for the following markers: CA125 Results of the tumor marker test revealed 53.4   10/10/2015 - 12/11/2015 Chemotherapy   She received 4 cycles of carboplatin only   11/20/2015 Tumor Marker   Patient's tumor was tested for the following markers: CA125 Results of the tumor marker test revealed 41.3   12/20/2015 Imaging   CT: Mild left external iliac lymphadenopathy is slightly  decreased. Mildly enlarged lower mesenteric nodule is slightly decreased. 2. No new or progressive metastatic disease in the abdomen or pelvis. 3. Cirrhosis. Stable subcentimeter hypodense left liver lobe lesion. No new liver lesions. 4. Stable mild splenomegaly.  No ascites. 5. Mild sigmoid diverticulosis.   01/01/2016 -  Anti-estrogen oral therapy   She was placed on tamoxifen. Had letrozole initially but switched to tamoxifen due to poor tolerance   01/15/2016 Tumor Marker   Patient's tumor was tested for the following markers: CA125 Results of the tumor marker test revealed 31.4   02/19/2016 Tumor Marker   Patient's tumor was tested for the following markers: CA125 Results of the tumor marker test revealed 36.5   05/13/2016 Imaging   No evidence of disease progression within the abdomen or pelvis. Previously noted small central mesenteric nodule and left external iliac node appear slightly smaller. 2. Stable changes of hepatic cirrhosis and portal hypertension with associated splenomegaly. No new or enlarging hepatic lesions are identified. 3. No acute findings.   05/13/2016 Tumor Marker   Patient's tumor was tested for the following markers: CA125 Results  of the tumor marker test revealed 41.2   06/24/2016 Tumor Marker   Patient's tumor was tested for the following markers: CA125 Results of the tumor marker test revealed 47.3   08/20/2016 Imaging   1. The left external iliac lymph node is slightly increased in size compared to the prior exam, currently 1.1 cm and previously 0.9 cm in diameter. 2. Stable central mesenteric lymph node at 0.9 cm in diameter. 3. Stable trace free pelvic fluid and stable slight thickening along the right paracolic gutter, without well-defined peritoneal nodularity. 4. Other imaging findings of potential clinical significance: Subsegmental atelectasis or scarring in the lung bases. Hepatic cirrhosis. Left renal scarring. Mild splenomegaly. Sigmoid colon  diverticulosis. Impingement at L4-5 due to spondylosis and degenerative disc disease broad Schmorl' s nodes at L3-L4. Pelvic floor laxity   08/23/2016 Tumor Marker   Patient's tumor was tested for the following markers: CA125 Results of the tumor marker test revealed 80.2   09/04/2016 Imaging   LV EF: 60% -   65%   09/12/2016 Tumor Marker   Patient's tumor was tested for the following markers: CA125 Results of the tumor marker test revealed 98.5   09/12/2016 - 01/29/2018 Chemotherapy   The patient received Doxil and Avastin. Avastin was discontinued in Dec 2019 due to GI hemorrhage   09/26/2016 Tumor Marker   Patient's tumor was tested for the following markers: CA125 Results of the tumor marker test revealed 68.9   10/10/2016 Tumor Marker   Patient's tumor was tested for the following markers: CA125 Results of the tumor marker test revealed 65.6   11/07/2016 Tumor Marker   Patient's tumor was tested for the following markers: CA125 Results of the tumor marker test revealed 46.4   11/29/2016 Imaging   Stable mild peritoneal thickening, suspicious for peritoneal carcinomatosis. No new or progressive disease identified. No evidence of ascites.  No significant change in 10 mm left external iliac and 8 mm mesenteric lymph nodes.  Stable hepatic cirrhosis, and splenomegaly consistent with portal venous hypertension.  Colonic diverticulosis, without radiographic evidence of diverticulitis.   12/02/2016 Imaging   Normal LV size with EF 55%. Basal inferior and basal inferoseptal hypokinesis. Normal RV size and systolic function. No significant valvular abnormalities.   12/05/2016 Tumor Marker   Patient's tumor was tested for the following markers: CA125 Results of the tumor marker test revealed 49.1   01/09/2017 Tumor Marker   Patient's tumor was tested for the following markers: CA125 Results of the tumor marker test revealed 47.2   02/06/2017 Tumor Marker   Patient's tumor was  tested for the following markers: CA125 Results of the tumor marker test revealed 44.1   02/18/2017 Imaging   Stable mild peritoneal thickening. No new or progressive disease identified within the abdomen or pelvis.  Stable tiny sub-cm left external iliac and mesenteric lymph nodes.  Stable hepatic cirrhosis, and splenomegaly consistent with portal venous hypertension. No evidence of hepatic neoplasm.  Colonic diverticulosis, without radiographic evidence of diverticulitis.   03/06/2017 Tumor Marker   Patient's tumor was tested for the following markers: CA125 Results of the tumor marker test revealed 50.4   03/17/2017 Imaging   - Left ventricle: The cavity size was normal. Wall thickness was normal. Systolic function was normal. The estimated ejection fraction was in the range of 55% to 60%. Wall motion was normal; there were no regional wall motion abnormalities. Features are consistent with a pseudonormal left ventricular filling pattern, with concomitant abnormal relaxation and increased filling pressure (grade  2 diastolic dysfunction). - Mitral valve: There was mild regurgitation. - Left atrium: The atrium was mildly dilated   04/03/2017 Tumor Marker   Patient's tumor was tested for the following markers: CA125 Results of the tumor marker test revealed 47.2   05/14/2017 Imaging   CT scan of abdomen and pelvis Stable mild peritoneal thickening. No new or progressive disease within the abdomen or pelvis.  Stable hepatic cirrhosis. Stable splenomegaly, consistent with portal venous hypertension. No evidence of hepatic neoplasm.   05/15/2017 Tumor Marker   Patient's tumor was tested for the following markers: CA125 Results of the tumor marker test revealed 56.1   06/12/2017 Tumor Marker   Patient's tumor was tested for the following markers: CA125 Results of the tumor marker test revealed 49.2   07/10/2017 Tumor Marker   Patient's tumor was tested for the following markers: CA125 Results  of the tumor marker test revealed 46.3   08/07/2017 Tumor Marker   Patient's tumor was tested for the following markers: CA125 Results of the tumor marker test revealed 52.9   08/20/2017 Imaging   1. Mild omental/peritoneal haziness, unchanged. No evidence of new metastatic disease. 2. Cirrhosis with splenomegaly.   09/04/2017 Tumor Marker   Patient's tumor was tested for the following markers: CA125 Results of the tumor marker test revealed 48.5   11/13/2017 Tumor Marker   Patient's tumor was tested for the following markers: CA125 Results of the tumor marker test revealed 55.8   12/17/2017 Imaging   1. No findings to suggest metastatic disease in the abdomen or pelvis. 2. Severe hepatic cirrhosis with evidence of portal hypertension, as demonstrated by dilated portal vein, splenomegaly and portosystemic collateral pathways, including gastric and esophageal varices.  3. Colonic diverticulosis without evidence of acute diverticulitis at this time. 4. Additional incidental findings, as above.   01/16/2018 Tumor Marker   Patient's tumor was tested for the following markers: CA125 Results of the tumor marker test revealed 63.7   02/10/2018 Procedure   EGD - Recently bleeding grade III and large (> 5 mm) esophageal varices. Completely eradicated. Banded. - Portal hypertensive gastropathy. - Normal examined duodenum. - No specimens collected   02/13/2018 Tumor Marker   Patient's tumor was tested for the following markers: CA125 Results of the tumor marker test revealed 82.7   02/25/2018 Imaging   Bone density showed mild osteopenia   04/08/2018 Tumor Marker   Patient's tumor was tested for the following markers: CA125 Results of the tumor marker test revealed 82.7   04/08/2018 Imaging   1. No definite omental or peritoneal surface lesions. However, there are 2 slowly enlarging lymph nodes noted. One is in the small bowel mesentery and the other is in the left deep pelvis. Could not  exclude recurrent disease. 2. Stable advanced cirrhotic changes involving the liver with portal venous hypertension, portal venous collaterals, esophageal varices and splenomegaly. No worrisome hepatic lesions. 3. Diffuse wall thickening of the colon could suggest diffuse colitis or could be due to low albumin. Recommend correlation with any symptoms such as diarrhea.   05/21/2018 Tumor Marker   Patient's tumor was tested for the following markers: CA125 Results of the tumor marker test revealed 86   09/07/2018 Tumor Marker   Patient's tumor was tested for the following markers:CA-125 Results of the tumor marker test revealed 111   09/29/2018 Tumor Marker   Patient's tumor was tested for the following markers: CA-125 Results of the tumor marker test revealed 121.   09/29/2018 - 11/24/2018 Chemotherapy  The patient had carboplatin and gemzar for chemotherapy treatment.  Chemo was stopped due to allergic reaction to carboplatin   10/27/2018 Tumor Marker   Patient's tumor was tested for the following markers: CA-125 Results of the tumor marker test revealed 99.9   11/24/2018 Tumor Marker   Patient's tumor was tested for the following markers: CA-125 Results of the tumor marker test revealed 81.2   12/14/2018 Tumor Marker   Patient's tumor was tested for the following markers: CA-125 Results of the tumor marker test revealed 81.2   12/14/2018 Imaging   1. Mild improvement in peritoneal disease. Peritoneal nodule within the left posterior pelvis contiguous with the left side of vaginal cuff is slightly decreased in size in the interval. Within the right iliac fossa there is a small peritoneal nodule which has decreased in size in the interval. Central small bowel mesenteric lymph node is not significantly changed. Slight decrease in size left periaortic lymph node.    2. Morphologic features of the liver compatible with cirrhosis. Splenomegaly is noted which may reflect portal venous  hypertension.   12/21/2018 Tumor Marker   Patient's tumor was tested for the following markers: CA-125 Results of the tumor marker test revealed 64.2   12/21/2018 -  Chemotherapy   The patient had taxotere for chemotherapy treatment.     03/02/2019 Tumor Marker   Patient's tumor was tested for the following markers: CA-125. 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   No new or progressive metastatic disease within the abdomen or pelvis.   Hepatic cirrhosis and findings of portal venous hypertension. 1.1 cm low-attenuation lesion within left hepatic lobe, which could represent a regenerative nodule or small hepatocellular carcinoma. Recommend abdomen MRI without and with contrast for further characterization.     04/13/2019 Tumor Marker   Patient's tumor was tested for the following markers: CA-125 Results of the tumor marker test revealed 36.8   05/26/2019 Tumor Marker   Patient's tumor was tested for the following markers: CA-125 Results of the tumor marker test revealed 31.6.   06/15/2019 Tumor Marker   Patient's tumor was tested for the following markers: CA-125 Results of the tumor marker test revealed 25.4   07/05/2019 Imaging   1. Stable CT examination. Stable peritoneal thickening in the pelvis and paracolic gutters. Stable solid left deep pelvic implant. Stable trace ascites. No new or progressive metastatic disease. 2. Stable cirrhosis. No discrete liver masses. 3. Stable mild-to-moderate splenomegaly. 4. Aortic Atherosclerosis (ICD10-I70.0).   07/27/2019 Tumor Marker   Patient's tumor was tested for the following markers: CA-125 Results of the tumor marker test revealed 35.4   08/17/2019 Tumor Marker   Patient's tumor was tested for the following markers: Ca-125 Results of the tumor marker test revealed 35.7.   09/17/2019 - 09/21/2019 Hospital Admission   She was admitted  due to esophageal variceal bleeding presents with hematemesis and melanotic stool with epigastric pain.  GI consulted, started on octreotide infusion.  Underwent EGD which showed minimal bleeding, remain on octreotide drip, PPI drip.  Hemoglobin trended down therefore received PRBC transfusion.  Over several days her bleeding subsided, hemodynamically remained stable.    09/17/2019 Procedure   EGD report - Esophageal mucosal scarring, from prior esophageal banding sessions. - Grade I esophageal varices. - Red blood in the cardia and in the gastric fundus. - Portal hypertensive gastropathy. - Normal duodenal bulb, first portion of the duodenum and  second portion of the duodenum. 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 2. Otherwise stable CT exam. Stable peritoneal thickening within the bilateral deep pelvic peritoneum. Stable left deep pelvic implant.No evidence for progressed disease. 3. Stable splenomegaly. 4. Morphologic changes to the liver compatible with cirrhosis. Trace perihepatic and pelvic ascites.   02/22/2020 Imaging   1. New lucent lesion in the left side of the L4 vertebral body measuring 1 cm, concerning for possible osseous metastasis. Consideration for further evaluation with bone scan is suggested. Close attention on follow-up imaging is also recommended to ensure stability. 2. Previously noted peritoneal thickening and nodularity in the low anatomic pelvis has regressed slightly. 3. Borderline enlarged and mildly enlarged pelvic and retroperitoneal lymph nodes appear similar to slightly  increased compared to the prior examination, as above. Continued attention on follow-up studies is recommended.  4. 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 5. Aortic atherosclerosis. 6. Additional incidental findings, as above.   02/22/2020 Tumor Marker   Patient's tumor was tested for the following markers: CA-125 Results of the tumor marker test revealed 30.1   04/06/2020 Tumor Marker   Patient's tumor was tested for the following markers: CA-125 Results of the tumor marker test revealed 35.7   09/11/2020 Tumor Marker   Patient's tumor was tested for the following markers: CA-125. Results of the tumor marker test revealed 26.6.   09/12/2020 Imaging   1. Slow enlargement of a currently 2.9 cubic cm soft tissue nodule along the left adnexa. Tumor deposit not excluded. 2. The left external iliac lymph node has substantially reduced in size, currently 0.9 cm in short axis and formerly 1.4 cm. 3. 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 4. Cirrhosis with portal venous hypertension. 5. Mild distal esophageal wall thickening, probably from esophagitis. Splenomegaly. 6. Scarring in the left kidney. 7. Sigmoid colon diverticulosis. 8. Pelvic floor laxity with small cystocele. 9. Lumbar impingement at L2-3, L3-4, and L4-5.     10/23/2020 Tumor Marker   Patient's tumor was tested for the following markers: CA-125. Results of the tumor marker test revealed 35.3.   12/12/2020 Tumor Marker   Patient's tumor was tested for the following markers: CA-125. Results of the tumor marker test revealed 39.1.   12/13/2020 Imaging   1. Continued increase in size of soft tissue nodule within the left posterior pelvis adjacent to the sigmoid colon. Cannot exclude small tumor deposit. 2. Similar appearance of thickening and  mild nodularity along the peritoneal reflections of the pelvis and pericolic gutters. 3. Morphologic findings of the liver compatible with cirrhosis. 4. Splenomegaly. 5. Aortic Atherosclerosis (ICD10-I70.0).   01/24/2021 Tumor Marker   Patient's tumor was tested for the following markers: CA-125. Results of the tumor marker test revealed 56.2.   02/09/2021 Imaging   1. Mild interval increased size of the solid left adnexal mass which may represent tumor recurrence/deposit. The mass abuts the adjacent sigmoid colon as well as the left posterior wall of the urinary bladder, invasion not excluded. 2. Otherwise no new mass or lymphadenopathy identified in the  abdomen or pelvis. 3. Hepatic cirrhosis. Splenomegaly consistent with portal hypertension.   04/11/2021 Imaging   1. Left posterior pelvic soft tissue mass, adjacent to the sigmoid colon, has slightly increased in size in comparison to recent prior with significant interval increase in size over more remote priors, findings which are highly suspicious for active neoplastic disease involvement. 2. No new discrete omental or peritoneal nodules/masses identified. 3. Similar nodular thickening of the peritoneum along the bilateral pericolic gutters and in the pelvis without ascites. 4. No pathologically enlarged abdominal or pelvic lymph nodes and no solid organ metastases visualized. 5. Cirrhotic hepatic morphology with evidence of portal hypertension including splenomegaly. 6. Sigmoid colonic diverticulosis without findings of acute diverticulitis. 7. Moderate volume of formed stool throughout the colon. 8. Aortic Atherosclerosis (ICD10-I70.0).   04/11/2021 Tumor Marker   Patient's tumor was tested for the following markers: CA-125. Results of the tumor marker test revealed 79.4 .   04/23/2021 - 10/09/2021 Chemotherapy   Patient is on Treatment Plan : Ovarian Docetaxel q21d     04/24/2021 Tumor Marker   Patient's tumor was tested for the  following markers: CA-125. Results of the tumor marker test revealed 68.5.   05/16/2021 Tumor Marker   Patient's tumor was tested for the following markers: CA-125. Results of the tumor marker test revealed 68.1.   06/28/2021 Tumor Marker   Patient's tumor was tested for the following markers: CA-125. Results of the tumor marker test revealed 52.   07/16/2021 Imaging   1. Stable cirrhotic changes involving the liver. Associated portal venous hypertension, portal venous collaterals and splenomegaly. No hepatic lesions. 2. Slight interval decrease in size of the left pelvic implant. 3. Stable minimal peritoneal nodularity involving the pericolic gutters bilaterally. No new or progressive findings.     08/09/2021 Tumor Marker   Patient's tumor was tested for the following markers: CA-125. Results of the tumor marker test revealed 44.1.   09/20/2021 Tumor Marker   Patient's tumor was tested for the following markers: CA-125. Results of the tumor marker test revealed 38.2.   10/31/2021 Imaging   1. Stable exam. No new or progressive interval findings. 2. Stable appearance of the soft tissue nodule in the left pelvis. 3. Subtle omental nodule is similar to prior. The minimal peritoneal nodularity described previously in the para colic gutters is similar to prior. 4. Cirrhosis with splenomegaly. 5. Cholelithiasis. 6. Left colonic diverticulosis without diverticulitis. 7. Aortic Atherosclerosis (ICD10-I70.0).   10/31/2021 Tumor Marker   Patient's tumor was tested for the following markers: Ca-125. Results of the tumor marker test revealed 37.7.   11/01/2021 - 11/01/2021 Chemotherapy   Patient is on Treatment Plan : BREAST Docetaxel (75) q21d     11/18/2021 -  Chemotherapy   She started on Niraparib   12/03/2021 Tumor Marker   Patient's tumor was tested for the following markers: CA-125. Results of the tumor marker test revealed 45.5.   12/27/2021 Tumor Marker   Patient's tumor was tested  for the following markers: CA-125. Results of the tumor marker test revealed 48.3.   01/31/2022 Tumor Marker   Patient's tumor was tested for the following markers: CA-`125. Results of the tumor marker test revealed 43.6.   02/26/2022 Imaging   Stable soft tissue nodule in the low left hemipelvis in the surgical bed of the previous hysterectomy. Additional small areas of thickening along the abdomen without new ascites. There is 1 small asymmetric area of increased thickening along the posterolateral left hemipelvis as above. Attention to this  area on short follow-up.   Nodular liver with fatty infiltration splenomegaly.   Gallstones.   Aortic atherosclerosis ICD10-I 70.0   04/01/2022 Tumor Marker   Patient's tumor was tested for the following markers: CA-125. Results of the tumor marker test revealed 49.7.     PHYSICAL EXAMINATION: ECOG PERFORMANCE STATUS: 1 - Symptomatic but completely ambulatory  Vitals:   05/23/22 0801  BP: (!) 123/54  Pulse: 75  Resp: 18  Temp: (!) 97.5 F (36.4 C)  SpO2: 100%   Filed Weights   05/23/22 0801  Weight: 178 lb 6.4 oz (80.9 kg)    GENERAL:alert, no distress and comfortable  NEURO: alert & oriented x 3 with fluent speech, no focal motor/sensory deficits  LABORATORY DATA:  I have reviewed the data as listed    Component Value Date/Time   NA 142 05/23/2022 0735   NA 142 02/06/2017 0742   K 4.0 05/23/2022 0735   K 3.9 02/06/2017 0742   CL 109 05/23/2022 0735   CO2 25 05/23/2022 0735   CO2 22 02/06/2017 0742   GLUCOSE 98 05/23/2022 0735   GLUCOSE 156 (H) 02/06/2017 0742   BUN 19 05/23/2022 0735   BUN 13.5 02/06/2017 0742   CREATININE 0.99 05/23/2022 0735   CREATININE 0.93 03/28/2022 0737   CREATININE 0.8 02/06/2017 0742   CALCIUM 8.6 (L) 05/23/2022 0735   CALCIUM 8.5 02/06/2017 0742   PROT 7.3 05/23/2022 0735   PROT 6.9 02/06/2017 0742   ALBUMIN 4.1 05/23/2022 0735   ALBUMIN 3.7 02/06/2017 0742   AST 21 05/23/2022 0735    AST 25 03/28/2022 0737   AST 47 (H) 02/06/2017 0742   ALT 19 05/23/2022 0735   ALT 22 03/28/2022 0737   ALT 54 02/06/2017 0742   ALKPHOS 109 05/23/2022 0735   ALKPHOS 130 02/06/2017 0742   BILITOT 0.6 05/23/2022 0735   BILITOT 0.7 03/28/2022 0737   BILITOT 0.65 02/06/2017 0742   GFRNONAA 60 (L) 05/23/2022 0735   GFRNONAA >60 03/28/2022 0737   GFRAA >60 11/09/2019 0953   GFRAA >60 10/26/2019 0722    No results found for: "SPEP", "UPEP"  Lab Results  Component Value Date   WBC 3.6 (L) 05/23/2022   NEUTROABS 2.4 05/23/2022   HGB 12.2 05/23/2022   HCT 36.1 05/23/2022   MCV 96.0 05/23/2022   PLT 83 (L) 05/23/2022      Chemistry      Component Value Date/Time   NA 142 05/23/2022 0735   NA 142 02/06/2017 0742   K 4.0 05/23/2022 0735   K 3.9 02/06/2017 0742   CL 109 05/23/2022 0735   CO2 25 05/23/2022 0735   CO2 22 02/06/2017 0742   BUN 19 05/23/2022 0735   BUN 13.5 02/06/2017 0742   CREATININE 0.99 05/23/2022 0735   CREATININE 0.93 03/28/2022 0737   CREATININE 0.8 02/06/2017 0742      Component Value Date/Time   CALCIUM 8.6 (L) 05/23/2022 0735   CALCIUM 8.5 02/06/2017 0742   ALKPHOS 109 05/23/2022 0735   ALKPHOS 130 02/06/2017 0742   AST 21 05/23/2022 0735   AST 25 03/28/2022 0737   AST 47 (H) 02/06/2017 0742   ALT 19 05/23/2022 0735   ALT 22 03/28/2022 0737   ALT 54 02/06/2017 0742   BILITOT 0.6 05/23/2022 0735   BILITOT 0.7 03/28/2022 0737   BILITOT 0.65 02/06/2017 0742

## 2022-05-23 NOTE — Assessment & Plan Note (Signed)
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 

## 2022-05-23 NOTE — Assessment & Plan Note (Signed)
Her last imaging study from January showed stable disease Even though she has clinically measurable disease, she have no evidence of disease progression or new lesions Overall, she tolerated maintenance Zejula well even at reduced dose I recommend we continue treatment indefinitely for now I plan to repeat imaging study again in May

## 2022-05-24 ENCOUNTER — Other Ambulatory Visit: Payer: Self-pay

## 2022-05-24 LAB — CA 125: Cancer Antigen (CA) 125: 57.2 U/mL — ABNORMAL HIGH (ref 0.0–38.1)

## 2022-05-30 ENCOUNTER — Encounter: Payer: Self-pay | Admitting: Hematology and Oncology

## 2022-06-07 ENCOUNTER — Other Ambulatory Visit (HOSPITAL_COMMUNITY): Payer: Self-pay

## 2022-06-18 ENCOUNTER — Other Ambulatory Visit (HOSPITAL_COMMUNITY): Payer: Self-pay

## 2022-06-20 ENCOUNTER — Other Ambulatory Visit: Payer: Self-pay

## 2022-07-09 ENCOUNTER — Ambulatory Visit (HOSPITAL_COMMUNITY)
Admission: RE | Admit: 2022-07-09 | Discharge: 2022-07-09 | Disposition: A | Discharge status: Home / Self Care | Payer: Medicare Other | Source: Ambulatory Visit | Attending: Hematology and Oncology | Admitting: Hematology and Oncology

## 2022-07-09 ENCOUNTER — Inpatient Hospital Stay: Payer: Medicare Other | Attending: Hematology and Oncology

## 2022-07-09 DIAGNOSIS — G8929 Other chronic pain: Secondary | ICD-10-CM | POA: Insufficient documentation

## 2022-07-09 DIAGNOSIS — M549 Dorsalgia, unspecified: Secondary | ICD-10-CM | POA: Insufficient documentation

## 2022-07-09 DIAGNOSIS — M47816 Spondylosis without myelopathy or radiculopathy, lumbar region: Secondary | ICD-10-CM | POA: Insufficient documentation

## 2022-07-09 DIAGNOSIS — R161 Splenomegaly, not elsewhere classified: Secondary | ICD-10-CM | POA: Insufficient documentation

## 2022-07-09 DIAGNOSIS — K8020 Calculus of gallbladder without cholecystitis without obstruction: Secondary | ICD-10-CM | POA: Diagnosis not present

## 2022-07-09 DIAGNOSIS — I700 Atherosclerosis of aorta: Secondary | ICD-10-CM | POA: Insufficient documentation

## 2022-07-09 DIAGNOSIS — C482 Malignant neoplasm of peritoneum, unspecified: Secondary | ICD-10-CM | POA: Insufficient documentation

## 2022-07-09 DIAGNOSIS — D61818 Other pancytopenia: Secondary | ICD-10-CM | POA: Insufficient documentation

## 2022-07-09 DIAGNOSIS — K766 Portal hypertension: Secondary | ICD-10-CM | POA: Diagnosis not present

## 2022-07-09 DIAGNOSIS — K7460 Unspecified cirrhosis of liver: Secondary | ICD-10-CM | POA: Diagnosis not present

## 2022-07-09 LAB — CBC WITH DIFFERENTIAL/PLATELET
Abs Immature Granulocytes: 0.01 10*3/uL (ref 0.00–0.07)
Basophils Absolute: 0 10*3/uL (ref 0.0–0.1)
Basophils Relative: 0 %
Eosinophils Absolute: 0.1 10*3/uL (ref 0.0–0.5)
Eosinophils Relative: 3 %
HCT: 37 % (ref 36.0–46.0)
Hemoglobin: 12.2 g/dL (ref 12.0–15.0)
Immature Granulocytes: 0 %
Lymphocytes Relative: 33 %
Lymphs Abs: 1.3 10*3/uL (ref 0.7–4.0)
MCH: 31.7 pg (ref 26.0–34.0)
MCHC: 33 g/dL (ref 30.0–36.0)
MCV: 96.1 fL (ref 80.0–100.0)
Monocytes Absolute: 0.3 10*3/uL (ref 0.1–1.0)
Monocytes Relative: 7 %
Neutro Abs: 2.2 10*3/uL (ref 1.7–7.7)
Neutrophils Relative %: 57 %
Platelets: 83 10*3/uL — ABNORMAL LOW (ref 150–400)
RBC: 3.85 MIL/uL — ABNORMAL LOW (ref 3.87–5.11)
RDW: 14 % (ref 11.5–15.5)
WBC: 3.9 10*3/uL — ABNORMAL LOW (ref 4.0–10.5)
nRBC: 0 % (ref 0.0–0.2)

## 2022-07-09 LAB — COMPREHENSIVE METABOLIC PANEL
ALT: 20 U/L (ref 0–44)
AST: 21 U/L (ref 15–41)
Albumin: 4.1 g/dL (ref 3.5–5.0)
Alkaline Phosphatase: 84 U/L (ref 38–126)
Anion gap: 9 (ref 5–15)
BUN: 16 mg/dL (ref 8–23)
CO2: 23 mmol/L (ref 22–32)
Calcium: 8.6 mg/dL — ABNORMAL LOW (ref 8.9–10.3)
Chloride: 110 mmol/L (ref 98–111)
Creatinine, Ser: 1.04 mg/dL — ABNORMAL HIGH (ref 0.44–1.00)
GFR, Estimated: 56 mL/min — ABNORMAL LOW (ref >60)
Glucose, Bld: 103 mg/dL — ABNORMAL HIGH (ref 70–99)
Potassium: 4.2 mmol/L (ref 3.5–5.1)
Sodium: 142 mmol/L (ref 135–145)
Total Bilirubin: 0.5 mg/dL (ref 0.3–1.2)
Total Protein: 7.1 g/dL (ref 6.5–8.1)

## 2022-07-09 MED ORDER — HEPARIN SOD (PORK) LOCK FLUSH 100 UNIT/ML IV SOLN
500.0000 [IU] | Freq: Once | INTRAVENOUS | Status: AC
  Administered 2022-07-09: 500 [IU] via INTRAVENOUS

## 2022-07-09 MED ORDER — IOHEXOL 9 MG/ML PO SOLN
1000.0000 mL | ORAL | Status: AC
  Administered 2022-07-09: 1000 mL via ORAL

## 2022-07-09 MED ORDER — SODIUM CHLORIDE (PF) 0.9 % IJ SOLN
INTRAMUSCULAR | Status: AC
  Filled 2022-07-09: qty 50

## 2022-07-09 MED ORDER — IOHEXOL 9 MG/ML PO SOLN
ORAL | Status: AC
  Filled 2022-07-09: qty 1000

## 2022-07-09 MED ORDER — HEPARIN SOD (PORK) LOCK FLUSH 100 UNIT/ML IV SOLN
INTRAVENOUS | Status: AC
  Filled 2022-07-09: qty 5

## 2022-07-09 MED ORDER — IOHEXOL 300 MG/ML  SOLN
100.0000 mL | Freq: Once | INTRAMUSCULAR | Status: AC | PRN
  Administered 2022-07-09: 100 mL via INTRAVENOUS

## 2022-07-11 ENCOUNTER — Encounter: Payer: Self-pay | Admitting: Hematology and Oncology

## 2022-07-11 ENCOUNTER — Inpatient Hospital Stay (HOSPITAL_BASED_OUTPATIENT_CLINIC_OR_DEPARTMENT_OTHER): Payer: Medicare Other | Admitting: Hematology and Oncology

## 2022-07-11 VITALS — BP 146/69 | HR 70 | Temp 97.6°F | Resp 18 | Ht 62.0 in | Wt 180.6 lb

## 2022-07-11 DIAGNOSIS — M549 Dorsalgia, unspecified: Secondary | ICD-10-CM

## 2022-07-11 DIAGNOSIS — D61818 Other pancytopenia: Secondary | ICD-10-CM

## 2022-07-11 DIAGNOSIS — C482 Malignant neoplasm of peritoneum, unspecified: Secondary | ICD-10-CM

## 2022-07-11 DIAGNOSIS — G8929 Other chronic pain: Secondary | ICD-10-CM

## 2022-07-11 MED ORDER — HYDROCODONE-ACETAMINOPHEN 5-325 MG PO TABS: 1.0000 | ORAL_TABLET | Freq: Four times a day (QID) | ORAL | 0 refills | Status: DC | PRN

## 2022-07-11 NOTE — Progress Notes (Signed)
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[facility]) Her imaging study in May were reviewed which showed stable disease Even though she has clinically measurable disease, she have no evidence of disease progression or new lesions Overall, she tolerated maintenance Zejula well even at reduced dose I recommend we continue treatment indefinitely for now I plan to repeat imaging study again in September  Pancytopenia, acquired [facility]) 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   All questions were answered. The patient knows to call the clinic with any problems, questions or concerns. The total time spent in the appointment was 30 minutes encounter with patients including review of chart and various tests results, discussions about plan of care and coordination of care plan   Artis Delay, MD 07/11/2022 9:13 AM  INTERVAL HISTORY: Please see below for problem oriented charting. she returns for treatment follow-up on niraparib We spent majority of our time reviewing CT imaging results She had recent upper respiratory tract infection, resolved with antibiotics Otherwise, she tolerated niraparib well  REVIEW OF SYSTEMS:   Constitutional: Denies fevers, chills or abnormal weight loss Eyes: Denies blurriness of vision Ears, nose, mouth, throat, and face: Denies mucositis or  sore throat Respiratory: Denies cough, dyspnea or wheezes Cardiovascular: Denies palpitation, chest discomfort or lower extremity swelling Gastrointestinal:  Denies nausea, heartburn or change in bowel habits Skin: Denies abnormal skin rashes Lymphatics: Denies new lymphadenopathy or easy bruising Neurological:Denies numbness, tingling or new weaknesses Behavioral/Psych: Mood is stable, no new changes  All other systems were reviewed with the patient and are negative.  I have reviewed the past medical history, past surgical history, social history and family history with the patient and they are unchanged from previous note.  ALLERGIES:  is allergic to carboplatin, shellfish allergy, vancomycin, benadryl [diphenhydramine hcl], penicillins, and tape.  MEDICATIONS:  Current Outpatient Medications  Medication Sig Dispense Refill   calcium elemental as carbonate (BARIATRIC TUMS ULTRA) 400 MG chewable tablet Chew 1 tablet by mouth 2 (two) times daily.     Cholecalciferol (VITAMIN D) 50 MCG (2000 UT) tablet Take 2,000 Units by mouth at bedtime.     gabapentin (NEURONTIN) 300 MG capsule TAKE 3 CAPSULES BY MOUTH TWICE DAILY 120 capsule 11   HYDROcodone-acetaminophen (NORCO/VICODIN) 5-325 MG tablet Take 1 tablet by mouth every 6 (six) hours as needed for moderate pain. 60 tablet 0   insulin degludec (TRESIBA FLEXTOUCH) 100 UNIT/ML FlexTouch Pen Inject 28 Units into the skin daily.     lidocaine-prilocaine (EMLA) cream Apply to Porta-cath  1-2 hours prior to access as directed. (Patient taking differently: Apply 1 application  topically daily as needed (port access).) 30 g 9   NEUPRO 4 MG/24HR APPLY 1 PATCH TOPICALLY TO THE SKIN DAILY 90 patch 0   niraparib tosylate (ZEJULA) 100 MG tablet Take 1 tablet (100 mg total) by mouth daily. 30 tablet 9   ondansetron (ZOFRAN)  8 MG tablet TAKE 1 TABLET(8 MG) BY MOUTH EVERY 8 HOURS AS NEEDED 60 tablet 1   OZEMPIC, 0.25 OR 0.5 MG/DOSE, 2 MG/1.5ML SOPN Inject  0.25 mg into the skin every Saturday.     pantoprazole (PROTONIX) 40 MG tablet Take 1 tablet (40 mg total) by mouth daily.     No current facility-administered medications for this visit.    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 Had allergic reaction to carboplatin. Avastin was stopped due to GI hemorrhage   Primary peritoneal carcinomatosis (HCC)  09/06/2011 Imaging   CT findings consistent with cirrhotic changes involving the liver.  No worrisome liver mass.  There are associated portal venous collaterals and splenomegaly consistent with portal venous hypertension. 2.  No other significant upper abdominal findings   11/08/2013 Imaging   US showed ascites   11/08/2013 Initial Diagnosis   Patient had ~ 2 months of some urinary incontinence, saw Dr Billy Coast in 10-2013, with pelvic mass on exam    11/12/2013 Imaging   There are findings of progressive hepatic cirrhosis with a large amount of ascites and mild splenomegaly. There are venous collaterals present. 2. There is abnormal thickening of the peritoneal surface along the left mid and lower abdominal wall. There is abnormal soft tissue density in the pelvis as well which could reflect the clinically suspected ovarian malignancy but the findings are nondiagnostic. 3. There is a new small left pleural effusion. 4. There is no acute bowel abnormality.   11/18/2013 Imaging   Successful ultrasound guided diagnostic and therapeutic paracentesis yielding 4.1 liters of ascites.     11/18/2013 Pathology Results   PERITONEAL/ASCITIC FLUID (SPECIMEN 1 OF 1 COLLECTED 11/18/13): SEROUS CARCINOMA, PLEASE SEE COMMENT.   12/01/2013 Tumor Marker   Patient's tumor was tested for the following markers: CA125 Results of the tumor marker test revealed 1938   12/03/2013 Imaging   Successful ultrasound-guided therapeutic paracentesis yielding 3.6 liters  of peritoneal fluid.   12/07/2013 - 05/30/2014 Chemotherapy   She received 3 cycles of carboplatin and Taxol, interrupted cycle 4 for surgery.  She subsequently completed 3 more cycles of chemotherapy after surgery   12/20/2013 Imaging   Successful ultrasound-guided diagnostic and therapeutic paracentesis yielding 2.2 liters of peritoneal fluid   12/20/2013 Pathology Results   PERITONEAL/ASCITIC FLUID(SPECIMEN 1 OF 1 COLLECTED 12/20/13): MALIGNANT CELLS CONSISTENT WITH SEROUS CARCINOMA   01/13/2014 Tumor Marker   Patient's tumor was tested for the following markers: CA125 Results of the tumor marker test revealed 227   02/07/2014 Tumor Marker   Patient's tumor was tested for the following markers: CA125 Results of the tumor marker test revealed 130   02/15/2014 Genetic Testing   Genetics testing from 02-2014 normal (OvaNext panel)   02/23/2014 Imaging   Interval decrease in ascites. 2. Morphologic changes in the liver consistent with cirrhosis. 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 Portal vein remains patent at this time. 3. Persistent but decreased abnormal soft tissue attenuation tracking in the omental fat. This may be secondary to interval improvement in metastatic disease. 4. Peritoneal thickening seen in the left abdomen and pelvis on the previous study has decreased and nearly resolved in the interval. This also suggests interval improvement in metastatic disease.     03/07/2014 Procedure   Ultrasound and fluoroscopically guided right internal jugular single lumen power port catheter insertion. Tip in the SVC/RA junction. Catheter ready for use.  03/17/2014 Tumor Marker   Patient's tumor was tested for the following markers: CA125 Results of the tumor marker test revealed 45   03/29/2014 Pathology Results   1. Omentum, resection for tumor - HIGH GRADE SEROUS CARCINOMA, SEE COMMENT. 2. Ovary and fallopian tube, right - HIGH GRADE SEROUS  CARCINOMA, SEE COMMENT. 3. Ovary and fallopian tube, left - HIGH GRADE SEROUS CARCINOMA, SEE COMMENT. Diagnosis Note 1. Nests and clusters of malignant cells are invading the omental tissue (part #1) with associated fibrosis. The cells are pleomorphic with prominent nucleoli. There are scattered psammoma bodies. The ovaries are atrophic and exhibit multiple foci of surface based invasive carcinoma and associated fibrosis. The fallopian tubes have a few foci of carcinoma, also superficially located. There are no precursor lesions noted in the ovaries or fallopian tubes (the entire tubes and ovaries were submitted for evaluation). While there is some retraction artifact, there are several foci suspicious for lymphovascular invasion. 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 Given the fibrosis, there does appear to be a small amount of treatment effect, however, there is abundant residual tumor.    03/29/2014 Surgery   Procedure(s) Performed: 1. Exploratory laparotomy with bilateral salpingo-oophorectomy, omentectomy radical tumor debulking for ovarian cancer .   Surgeon: Luisa Dago, MD.   Assistant Surgeon: Antionette Char, M.D. Assistant: (an MD assistant was necessary for tissue manipulation, retraction and positioning due to the complexity of the case and hospital policies).  Operative Findings: 10cm omental cake from hepatic to splenic flexure, densely adherent to transverse colon. Milliary studding of tumor implants (<92mm, too numerous in number to count) adherent to the mesentery of the small bowel and small bowel wall. Small volume (100cc) ascites. Small ovaries bilaterally, densely adherent to the pelvic cul de sac. Left ovary and tube densely adherent to sigmoid colon. Cirrhotic liver with hepatomegaly and splenomegaly.     This represented an optimal  cytoreduction (R1) with visible disease residual on the bowel wall and mesentery (millial, <48mm implants).      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 2. 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     08/18/2014 Tumor Marker   Patient's tumor was tested for the following markers: CA125 Results of the tumor marker test revealed 16   10/13/2014 Tumor Marker   Patient's tumor was tested for the following markers: CA125 Results of the tumor marker test revealed 14   11/21/2014 Tumor Marker   Patient's tumor was tested for the following markers: CA125 Results of the tumor marker test revealed 16   12/28/2014 Tumor Marker   Patient's tumor was tested for the following markers: CA125 Results of the tumor marker test revealed 762   02/27/2015 Tumor Marker   Patient's tumor was tested for the following markers: CA125 Results of the tumor marker test revealed 20.2   03/22/2015 Imaging   Filler appearance to the prior exam,  with very subtle fluid tracking along portions of the liver, and very minimal nodularity along the right paracolic gutter representing residua from the prior peritoneal cancer. The current abnormalities could simply be post therapy findings rather than necessarily representing residual malignancy. No new or enlarging lesions are identified. 2. Hepatic cirrhosis and splenomegaly. There is some gastric varices suggesting portal venous hypertension. 3. Left foraminal stenosis at L4-5 due to  spurring. There is likely also some central narrowing of the thecal sac at this level. 4. Bibasilar scarring. 5. Chronic mild left mid kidney scarring   03/27/2015 Tumor Marker   Patient's tumor was tested for the following markers: CA125 Results of the tumor marker test revealed 25.3   04/24/2015 Imaging   Disc bulge L2-3 with mild spinal stenosis and right foraminal encroachment. 2. Disc bulge L3-4 with mild spinal stenosis. 3. Multifactorial spinal, left lateral recess and foraminal stenosis L4-5. There is grade 1 anterolisthesis without evident dynamic instability.   05/29/2015 Tumor Marker   Patient's tumor was tested for the following markers: CA125 Results of the tumor marker test revealed 29.9   08/21/2015 Tumor Marker   Patient's tumor was tested for the following markers: CA125 Results of the tumor marker test revealed 45   09/18/2015 Tumor Marker   Patient's tumor was tested for the following markers: CA125 Results of the tumor marker test revealed 47   09/27/2015 Imaging   New small bowel mesenteric nodule and enlarged left external iliac lymph node, highly worrisome for metastatic disease. 2. Cirrhosis and splenomegaly   10/10/2015 Tumor Marker   Patient's tumor was tested for the following markers: CA125 Results of the tumor marker test revealed 53.4   10/10/2015 - 12/11/2015 Chemotherapy   She received 4 cycles of carboplatin only   11/20/2015 Tumor Marker   Patient's tumor was tested for the following markers: CA125 Results of the tumor marker test revealed 41.3   12/20/2015 Imaging   CT: Mild left external iliac lymphadenopathy is slightly decreased. Mildly enlarged lower mesenteric nodule is slightly decreased. 2. No new or progressive metastatic disease in the abdomen or pelvis. 3. Cirrhosis. Stable subcentimeter hypodense left liver lobe lesion. No new liver lesions. 4. Stable mild splenomegaly.  No ascites. 5. Mild sigmoid diverticulosis.   01/01/2016 -  Anti-estrogen  oral therapy   She was placed on tamoxifen. Had letrozole initially but switched to tamoxifen due to poor tolerance   01/15/2016 Tumor Marker   Patient's tumor was tested for the following markers: CA125 Results of the tumor marker test revealed 31.4   02/19/2016 Tumor Marker   Patient's tumor was tested for the following markers: CA125 Results of the tumor marker test revealed 36.5   05/13/2016 Imaging   No evidence of disease progression within the abdomen or pelvis. Previously noted small central mesenteric nodule and left external iliac node appear slightly smaller. 2. Stable changes of hepatic cirrhosis and portal hypertension with associated splenomegaly. No new or enlarging hepatic lesions are identified. 3. No acute findings.   05/13/2016 Tumor Marker   Patient's tumor was tested for the following markers: CA125 Results of the tumor marker test revealed 41.2   06/24/2016 Tumor Marker   Patient's tumor was tested for the following markers: CA125 Results of the tumor marker test revealed 47.3   08/20/2016 Imaging   1. The left external iliac lymph node is slightly increased in size compared to the prior exam, currently 1.1 cm and previously 0.9 cm in diameter. 2. Stable central mesenteric lymph node  at 0.9 cm in diameter. 3. Stable trace free pelvic fluid and stable slight thickening along the right paracolic gutter, without well-defined peritoneal nodularity. 4. Other imaging findings of potential clinical significance: Subsegmental atelectasis or scarring in the lung bases. Hepatic cirrhosis. Left renal scarring. Mild splenomegaly. Sigmoid colon diverticulosis. Impingement at L4-5 due to spondylosis and degenerative disc disease broad Schmorl' s nodes at L3-L4. Pelvic floor laxity   08/23/2016 Tumor Marker   Patient's tumor was tested for the following markers: CA125 Results of the tumor marker test revealed 80.2   09/04/2016 Imaging   LV EF: 60% -   65%   09/12/2016 Tumor Marker    Patient's tumor was tested for the following markers: CA125 Results of the tumor marker test revealed 98.5   09/12/2016 - 01/29/2018 Chemotherapy   The patient received Doxil and Avastin. Avastin was discontinued in Dec 2019 due to GI hemorrhage   09/26/2016 Tumor Marker   Patient's tumor was tested for the following markers: CA125 Results of the tumor marker test revealed 68.9   10/10/2016 Tumor Marker   Patient's tumor was tested for the following markers: CA125 Results of the tumor marker test revealed 65.6   11/07/2016 Tumor Marker   Patient's tumor was tested for the following markers: CA125 Results of the tumor marker test revealed 46.4   11/29/2016 Imaging   Stable mild peritoneal thickening, suspicious for peritoneal carcinomatosis. No new or progressive disease identified. No evidence of ascites.  No significant change in 10 mm left external iliac and 8 mm mesenteric lymph nodes.  Stable hepatic cirrhosis, and splenomegaly consistent with portal venous hypertension.  Colonic diverticulosis, without radiographic evidence of diverticulitis.   12/02/2016 Imaging   Normal LV size with EF 55%. Basal inferior and basal inferoseptal hypokinesis. Normal RV size and systolic function. No significant valvular abnormalities.   12/05/2016 Tumor Marker   Patient's tumor was tested for the following markers: CA125 Results of the tumor marker test revealed 49.1   01/09/2017 Tumor Marker   Patient's tumor was tested for the following markers: CA125 Results of the tumor marker test revealed 47.2   02/06/2017 Tumor Marker   Patient's tumor was tested for the following markers: CA125 Results of the tumor marker test revealed 44.1   02/18/2017 Imaging   Stable mild peritoneal thickening. No new or progressive disease identified within the abdomen or pelvis.  Stable tiny sub-cm left external iliac and mesenteric lymph nodes.  Stable hepatic cirrhosis, and splenomegaly consistent with  portal venous hypertension. No evidence of hepatic neoplasm.  Colonic diverticulosis, without radiographic evidence of diverticulitis.   03/06/2017 Tumor Marker   Patient's tumor was tested for the following markers: CA125 Results of the tumor marker test revealed 50.4   03/17/2017 Imaging   - Left ventricle: The cavity size was normal. Wall thickness was normal. Systolic function was normal. The estimated ejection fraction was in the range of 55% to 60%. Wall motion was normal; there were no regional wall motion abnormalities. Features are consistent with a pseudonormal left ventricular filling pattern, with concomitant abnormal relaxation and increased filling pressure (grade 2 diastolic dysfunction). - Mitral valve: There was mild regurgitation. - Left atrium: The atrium was mildly dilated   04/03/2017 Tumor Marker   Patient's tumor was tested for the following markers: CA125 Results of the tumor marker test revealed 47.2   05/14/2017 Imaging   CT scan of abdomen and pelvis Stable mild peritoneal thickening. No new or progressive disease within the abdomen or pelvis.  Stable hepatic cirrhosis. Stable splenomegaly, consistent with portal venous hypertension. No evidence of hepatic neoplasm.   05/15/2017 Tumor Marker   Patient's tumor was tested for the following markers: CA125 Results of the tumor marker test revealed 56.1   06/12/2017 Tumor Marker   Patient's tumor was tested for the following markers: CA125 Results of the tumor marker test revealed 49.2   07/10/2017 Tumor Marker   Patient's tumor was tested for the following markers: CA125 Results of the tumor marker test revealed 46.3   08/07/2017 Tumor Marker   Patient's tumor was tested for the following markers: CA125 Results of the tumor marker test revealed 52.9   08/20/2017 Imaging   1. Mild omental/peritoneal haziness, unchanged. No evidence of new metastatic disease. 2. Cirrhosis with splenomegaly.   09/04/2017 Tumor Marker    Patient's tumor was tested for the following markers: CA125 Results of the tumor marker test revealed 48.5   11/13/2017 Tumor Marker   Patient's tumor was tested for the following markers: CA125 Results of the tumor marker test revealed 55.8   12/17/2017 Imaging   1. No findings to suggest metastatic disease in the abdomen or pelvis. 2. Severe hepatic cirrhosis with evidence of portal hypertension, as demonstrated by dilated portal vein, splenomegaly and portosystemic collateral pathways, including gastric and esophageal varices.  3. Colonic diverticulosis without evidence of acute diverticulitis at this time. 4. Additional incidental findings, as above.   01/16/2018 Tumor Marker   Patient's tumor was tested for the following markers: CA125 Results of the tumor marker test revealed 63.7   02/10/2018 Procedure   EGD - Recently bleeding grade III and large (> 5 mm) esophageal varices. Completely eradicated. Banded. - Portal hypertensive gastropathy. - Normal examined duodenum. - No specimens collected   02/13/2018 Tumor Marker   Patient's tumor was tested for the following markers: CA125 Results of the tumor marker test revealed 82.7   02/25/2018 Imaging   Bone density showed mild osteopenia   04/08/2018 Tumor Marker   Patient's tumor was tested for the following markers: CA125 Results of the tumor marker test revealed 82.7   04/08/2018 Imaging   1. No definite omental or peritoneal surface lesions. However, there are 2 slowly enlarging lymph nodes noted. One is in the small bowel mesentery and the other is in the left deep pelvis. Could not exclude recurrent disease. 2. Stable advanced cirrhotic changes involving the liver with portal venous hypertension, portal venous collaterals, esophageal varices and splenomegaly. No worrisome hepatic lesions. 3. Diffuse wall thickening of the colon could suggest diffuse colitis or could be due to low albumin. Recommend correlation with any  symptoms such as diarrhea.   05/21/2018 Tumor Marker   Patient's tumor was tested for the following markers: CA125 Results of the tumor marker test revealed 86   09/07/2018 Tumor Marker   Patient's tumor was tested for the following markers:CA-125 Results of the tumor marker test revealed 111   09/29/2018 Tumor Marker   Patient's tumor was tested for the following markers: CA-125 Results of the tumor marker test revealed 121.   09/29/2018 - 11/24/2018 Chemotherapy   The patient had carboplatin and gemzar for chemotherapy treatment.  Chemo was stopped due to allergic reaction to carboplatin   10/27/2018 Tumor Marker   Patient's tumor was tested for the following markers: CA-125 Results of the tumor marker test revealed 99.9   11/24/2018 Tumor Marker   Patient's tumor was tested for the following markers: CA-125 Results of the tumor marker test revealed 81.2  12/14/2018 Tumor Marker   Patient's tumor was tested for the following markers: CA-125 Results of the tumor marker test revealed 81.2   12/14/2018 Imaging   1. Mild improvement in peritoneal disease. Peritoneal nodule within the left posterior pelvis contiguous with the left side of vaginal cuff is slightly decreased in size in the interval. Within the right iliac fossa there is a small peritoneal nodule which has decreased in size in the interval. Central small bowel mesenteric lymph node is not significantly changed. Slight decrease in size left periaortic lymph node.    2. Morphologic features of the liver compatible with cirrhosis. Splenomegaly is noted which may reflect portal venous hypertension.   12/21/2018 Tumor Marker   Patient's tumor was tested for the following markers: CA-125 Results of the tumor marker test revealed 64.2   12/21/2018 -  Chemotherapy   The patient had taxotere for chemotherapy treatment.     03/02/2019 Tumor Marker   Patient's tumor was tested for the following markers: CA-125. Results of the tumor  marker test revealed 36.3   03/22/2019 Imaging   Mild peritoneal carcinoma shows further improvement since previous study.   No new or progressive metastatic disease within the abdomen or pelvis.   Hepatic cirrhosis and findings of portal venous hypertension. 1.1 cm low-attenuation lesion within left hepatic lobe, which could represent a regenerative nodule or small hepatocellular carcinoma. Recommend abdomen MRI without and with contrast for further characterization.     04/13/2019 Tumor Marker   Patient's tumor was tested for the following markers: CA-125 Results of the tumor marker test revealed 36.8   05/26/2019 Tumor Marker   Patient's tumor was tested for the following markers: CA-125 Results of the tumor marker test revealed 31.6.   06/15/2019 Tumor Marker   Patient's tumor was tested for the following markers: CA-125 Results of the tumor marker test revealed 25.4   07/05/2019 Imaging   1. Stable CT examination. Stable peritoneal thickening in the pelvis and paracolic gutters. Stable solid left deep pelvic implant. Stable trace ascites. No new or progressive metastatic disease. 2. Stable cirrhosis. No discrete liver masses. 3. Stable mild-to-moderate splenomegaly. 4. Aortic Atherosclerosis (ICD10-I70.0).   07/27/2019 Tumor Marker   Patient's tumor was tested for the following markers: CA-125 Results of the tumor marker test revealed 35.4   08/17/2019 Tumor Marker   Patient's tumor was tested for the following markers: Ca-125 Results of the tumor marker test revealed 35.7.   09/17/2019 - 09/21/2019 Hospital Admission   She was admitted due to esophageal variceal bleeding presents with hematemesis and melanotic stool with epigastric pain.  GI consulted, started on octreotide infusion.  Underwent EGD which showed minimal bleeding, remain on octreotide drip, PPI drip.  Hemoglobin trended down therefore received PRBC transfusion.  Over several days her bleeding subsided, hemodynamically  remained stable.    09/17/2019 Procedure   EGD report - Esophageal mucosal scarring, from prior esophageal banding sessions. - Grade I esophageal varices. - Red blood in the cardia and in the gastric fundus. - Portal hypertensive gastropathy. - Normal duodenal bulb, first portion of the duodenum and second portion of the duodenum. 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  2. Otherwise stable CT exam. Stable peritoneal thickening within the bilateral deep pelvic peritoneum. Stable left deep pelvic implant.No evidence for progressed disease. 3. Stable splenomegaly. 4. Morphologic changes to the liver compatible with cirrhosis. Trace perihepatic and pelvic ascites.   02/22/2020 Imaging   1. New lucent lesion in the left side of the L4 vertebral body measuring 1 cm, concerning for possible osseous metastasis. Consideration for further evaluation with bone scan is suggested. Close attention on follow-up imaging is also recommended to ensure stability. 2. Previously noted peritoneal thickening and nodularity in the low anatomic pelvis has regressed slightly. 3. Borderline enlarged and mildly enlarged pelvic and retroperitoneal lymph nodes appear similar to slightly increased compared to the prior examination, as above. Continued attention on follow-up studies is recommended.  4. 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 5. Aortic atherosclerosis. 6. Additional incidental findings, as above.   02/22/2020 Tumor Marker   Patient's tumor was  tested for the following markers: CA-125 Results of the tumor marker test revealed 30.1   04/06/2020 Tumor Marker   Patient's tumor was tested for the following markers: CA-125 Results of the tumor marker test revealed 35.7   09/11/2020 Tumor Marker   Patient's tumor was tested for the following markers: CA-125. Results of the tumor marker test revealed 26.6.   09/12/2020 Imaging   1. Slow enlargement of a currently 2.9 cubic cm soft tissue nodule along the left adnexa. Tumor deposit not excluded. 2. The left external iliac lymph node has substantially reduced in size, currently 0.9 cm in short axis and formerly 1.4 cm. 3. 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 4. Cirrhosis with portal venous hypertension. 5. Mild distal esophageal wall thickening, probably from esophagitis. Splenomegaly. 6. Scarring in the left kidney. 7. Sigmoid colon diverticulosis. 8. Pelvic floor laxity with small cystocele. 9. Lumbar impingement at L2-3, L3-4, and L4-5.     10/23/2020 Tumor Marker   Patient's tumor was tested for the following markers: CA-125. Results of the tumor marker test revealed 35.3.   12/12/2020 Tumor Marker   Patient's tumor was tested for the following markers: CA-125. Results of the tumor marker test revealed 39.1.   12/13/2020 Imaging   1. Continued increase in size of soft tissue nodule within the left posterior pelvis adjacent to the sigmoid colon. Cannot exclude small tumor deposit. 2. Similar appearance of thickening and mild nodularity along the peritoneal reflections of the pelvis and pericolic gutters. 3. Morphologic findings of the liver compatible with cirrhosis. 4. Splenomegaly. 5. Aortic Atherosclerosis (ICD10-I70.0).   01/24/2021 Tumor Marker   Patient's tumor was tested for the following markers: CA-125. Results of the tumor marker test revealed  56.2.   02/09/2021 Imaging   1. Mild interval increased size of the solid left adnexal mass which may represent tumor recurrence/deposit. The mass abuts the adjacent sigmoid colon as well as the left posterior wall of the urinary bladder, invasion not excluded. 2. Otherwise no new mass or lymphadenopathy identified in the abdomen or pelvis. 3. Hepatic cirrhosis. Splenomegaly consistent with portal hypertension.   04/11/2021 Imaging   1. Left posterior pelvic soft tissue mass, adjacent to the sigmoid colon, has slightly increased in size in comparison to recent prior with significant interval increase in size over more remote priors, findings which are highly suspicious for active neoplastic disease involvement. 2. No new discrete omental or peritoneal nodules/masses identified. 3.  Similar nodular thickening of the peritoneum along the bilateral pericolic gutters and in the pelvis without ascites. 4. No pathologically enlarged abdominal or pelvic lymph nodes and no solid organ metastases visualized. 5. Cirrhotic hepatic morphology with evidence of portal hypertension including splenomegaly. 6. Sigmoid colonic diverticulosis without findings of acute diverticulitis. 7. Moderate volume of formed stool throughout the colon. 8. Aortic Atherosclerosis (ICD10-I70.0).   04/11/2021 Tumor Marker   Patient's tumor was tested for the following markers: CA-125. Results of the tumor marker test revealed 79.4 .   04/23/2021 - 10/09/2021 Chemotherapy   Patient is on Treatment Plan : Ovarian Docetaxel q21d     04/24/2021 Tumor Marker   Patient's tumor was tested for the following markers: CA-125. Results of the tumor marker test revealed 68.5.   05/16/2021 Tumor Marker   Patient's tumor was tested for the following markers: CA-125. Results of the tumor marker test revealed 68.1.   06/28/2021 Tumor Marker   Patient's tumor was tested for the following markers: CA-125. Results of the tumor marker test revealed  52.   07/16/2021 Imaging   1. Stable cirrhotic changes involving the liver. Associated portal venous hypertension, portal venous collaterals and splenomegaly. No hepatic lesions. 2. Slight interval decrease in size of the left pelvic implant. 3. Stable minimal peritoneal nodularity involving the pericolic gutters bilaterally. No new or progressive findings.     08/09/2021 Tumor Marker   Patient's tumor was tested for the following markers: CA-125. Results of the tumor marker test revealed 44.1.   09/20/2021 Tumor Marker   Patient's tumor was tested for the following markers: CA-125. Results of the tumor marker test revealed 38.2.   10/31/2021 Imaging   1. Stable exam. No new or progressive interval findings. 2. Stable appearance of the soft tissue nodule in the left pelvis. 3. Subtle omental nodule is similar to prior. The minimal peritoneal nodularity described previously in the para colic gutters is similar to prior. 4. Cirrhosis with splenomegaly. 5. Cholelithiasis. 6. Left colonic diverticulosis without diverticulitis. 7. Aortic Atherosclerosis (ICD10-I70.0).   10/31/2021 Tumor Marker   Patient's tumor was tested for the following markers: Ca-125. Results of the tumor marker test revealed 37.7.   11/01/2021 - 11/01/2021 Chemotherapy   Patient is on Treatment Plan : BREAST Docetaxel (75) q21d     11/18/2021 -  Chemotherapy   She started on Niraparib   12/03/2021 Tumor Marker   Patient's tumor was tested for the following markers: CA-125. Results of the tumor marker test revealed 45.5.   12/27/2021 Tumor Marker   Patient's tumor was tested for the following markers: CA-125. Results of the tumor marker test revealed 48.3.   01/31/2022 Tumor Marker   Patient's tumor was tested for the following markers: CA-`125. Results of the tumor marker test revealed 43.6.   02/26/2022 Imaging   Stable soft tissue nodule in the low left hemipelvis in the surgical bed of the previous  hysterectomy. Additional small areas of thickening along the abdomen without new ascites. There is 1 small asymmetric area of increased thickening along the posterolateral left hemipelvis as above. Attention to this area on short follow-up.   Nodular liver with fatty infiltration splenomegaly.   Gallstones.   Aortic atherosclerosis ICD10-I 70.0   04/01/2022 Tumor Marker   Patient's tumor was tested for the following markers: CA-125. Results of the tumor marker test revealed 49.7.   05/24/2022 Tumor Marker   Patient's tumor was tested for the following markers: CA-125. Results of the tumor marker test revealed 57.2.  07/11/2022 Imaging   CT ABDOMEN PELVIS W CONTRAST  Result Date: 07/10/2022 CLINICAL DATA:  Ovarian cancer, assess treatment response. Primary peritoneal carcinomatosis. Slowly rising CA 125 levels. * Tracking Code: BO * EXAM: CT ABDOMEN AND PELVIS WITH CONTRAST TECHNIQUE: Multidetector CT imaging of the abdomen and pelvis was performed using the standard protocol following bolus administration of intravenous contrast. RADIATION DOSE REDUCTION: This exam was performed according to the departmental dose-optimization program which includes automated exposure control, adjustment of the mA and/or kV according to patient size and/or use of iterative reconstruction technique. CONTRAST:  OMNIPAQUE IOHEXOL 300 MG/ML  SOLN COMPARISON:  Multiple prior studies, most recently performed 03/20/2022 and 10/30/2021. FINDINGS: Lower chest: Linear atelectasis or scarring at both lung bases, similar to previous studies. No airspace disease or suspicious nodularity. No significant pleural or pericardial effusion. Bilateral breast implants are partially imaged. Mild distal esophageal wall thickening, unchanged. Hepatobiliary: The liver is normal in density without suspicious focal abnormality. There is diffuse contour irregularity of the liver consistent with cirrhosis. Unchanged punctate calcified  gallstone. No gallbladder wall thickening, surrounding inflammation or significant biliary dilatation. Pancreas: Unremarkable. No pancreatic ductal dilatation or surrounding inflammatory changes. Spleen: Stable mild to moderate splenomegaly. The spleen measures up to 16.9 cm on sagittal image 83/6. No focal splenic abnormalities are identified. Adrenals/Urinary Tract: Both adrenal glands appear normal. No evidence of urinary tract calculus, suspicious renal lesion or hydronephrosis. Stable mild left renal cortical scarring and atrophy. The bladder appears unremarkable for its degree of distention. Stomach/Bowel: Enteric contrast has passed into the proximal colon. The stomach appears unremarkable for its degree of distension. No evidence of bowel wall thickening, distention or surrounding inflammatory change. Mildly prominent stool throughout the colon. Vascular/Lymphatic: There are no enlarged abdominal or pelvic lymph nodes. No acute vascular findings are demonstrated. There is mild aortic and branch vessel atherosclerosis without evidence of aneurysm or large vessel occlusion. Retroaortic left renal vein noted. Reproductive: Hysterectomy. Left pelvic soft tissue nodule adjacent to the vaginal cuff and sigmoid colon is unchanged from the most recent prior studies, measuring 3.1 x 2.2 cm on image 73/2. As previously discussed, this has mildly enlarged compared with older prior studies in 2022. Other: Stable postsurgical changes in the anterior abdominal wall. Mesenteric soft tissue nodule adjacent to the small bowel measuring 1.4 x 1.0 cm on image 54/2 is unchanged. No progressive peritoneal nodularity or recurrent ascites identified. No evidence of pneumoperitoneum or focal extraluminal fluid collection. Musculoskeletal: No acute or significant osseous findings. Stable lower lumbar spondylosis with chronic superior endplate Schmorl's nodes at L3 and L4. IMPRESSION: 1. Stable abdominopelvic CT without evidence of  progressive peritoneal nodularity or recurrent ascites. 2. Stable soft tissue nodules in the left pelvis and small-bowel mesentery, likely stable residual peritoneal disease. No evidence of progressive metastatic disease. 3. Stable findings of cirrhosis and portal hypertension. 4. Cholelithiasis without evidence of cholecystitis or biliary dilatation. 5.  Aortic Atherosclerosis (ICD10-I70.0). Electronically Signed   By: Carey Bullocks M.D.   On: 07/10/2022 14:08        PHYSICAL EXAMINATION: ECOG PERFORMANCE STATUS: 1 - Symptomatic but completely ambulatory  Vitals:   07/11/22 0805  BP: (!) 146/69  Pulse: 70  Resp: 18  Temp: 97.6 F (36.4 C)  SpO2: 100%   Filed Weights   07/11/22 0805  Weight: 180 lb 9.6 oz (81.9 kg)    GENERAL:alert, no distress and comfortable NEURO: alert & oriented x 3 with fluent speech, no focal motor/sensory deficits  LABORATORY DATA:  I have reviewed the data as listed    Component Value Date/Time   NA 142 07/09/2022 0724   NA 142 02/06/2017 0742   K 4.2 07/09/2022 0724   K 3.9 02/06/2017 0742   CL 110 07/09/2022 0724   CO2 23 07/09/2022 0724   CO2 22 02/06/2017 0742   GLUCOSE 103 (H) 07/09/2022 0724   GLUCOSE 156 (H) 02/06/2017 0742   BUN 16 07/09/2022 0724   BUN 13.5 02/06/2017 0742   CREATININE 1.04 (H) 07/09/2022 0724   CREATININE 0.93 03/28/2022 0737   CREATININE 0.8 02/06/2017 0742   CALCIUM 8.6 (L) 07/09/2022 0724   CALCIUM 8.5 02/06/2017 0742   PROT 7.1 07/09/2022 0724   PROT 6.9 02/06/2017 0742   ALBUMIN 4.1 07/09/2022 0724   ALBUMIN 3.7 02/06/2017 0742   AST 21 07/09/2022 0724   AST 25 03/28/2022 0737   AST 47 (H) 02/06/2017 0742   ALT 20 07/09/2022 0724   ALT 22 03/28/2022 0737   ALT 54 02/06/2017 0742   ALKPHOS 84 07/09/2022 0724   ALKPHOS 130 02/06/2017 0742   BILITOT 0.5 07/09/2022 0724   BILITOT 0.7 03/28/2022 0737   BILITOT 0.65 02/06/2017 0742   GFRNONAA 56 (L) 07/09/2022 0724   GFRNONAA >60 03/28/2022 0737    GFRAA >60 11/09/2019 0953   GFRAA >60 10/26/2019 0722    No results found for: "SPEP", "UPEP"  Lab Results  Component Value Date   WBC 3.9 (L) 07/09/2022   NEUTROABS 2.2 07/09/2022   HGB 12.2 07/09/2022   HCT 37.0 07/09/2022   MCV 96.1 07/09/2022   PLT 83 (L) 07/09/2022      Chemistry      Component Value Date/Time   NA 142 07/09/2022 0724   NA 142 02/06/2017 0742   K 4.2 07/09/2022 0724   K 3.9 02/06/2017 0742   CL 110 07/09/2022 0724   CO2 23 07/09/2022 0724   CO2 22 02/06/2017 0742   BUN 16 07/09/2022 0724   BUN 13.5 02/06/2017 0742   CREATININE 1.04 (H) 07/09/2022 0724   CREATININE 0.93 03/28/2022 0737   CREATININE 0.8 02/06/2017 0742      Component Value Date/Time   CALCIUM 8.6 (L) 07/09/2022 0724   CALCIUM 8.5 02/06/2017 0742   ALKPHOS 84 07/09/2022 0724   ALKPHOS 130 02/06/2017 0742   AST 21 07/09/2022 0724   AST 25 03/28/2022 0737   AST 47 (H) 02/06/2017 0742   ALT 20 07/09/2022 0724   ALT 22 03/28/2022 0737   ALT 54 02/06/2017 0742   BILITOT 0.5 07/09/2022 0724   BILITOT 0.7 03/28/2022 0737   BILITOT 0.65 02/06/2017 0742       RADIOGRAPHIC STUDIES: I have reviewed imaging studies with the patient I have personally reviewed the radiological images as listed and agreed with the findings in the report. CT ABDOMEN PELVIS W CONTRAST  Result Date: 07/10/2022 CLINICAL DATA:  Ovarian cancer, assess treatment response. Primary peritoneal carcinomatosis. Slowly rising CA 125 levels. * Tracking Code: BO * EXAM: CT ABDOMEN AND PELVIS WITH CONTRAST TECHNIQUE: Multidetector CT imaging of the abdomen and pelvis was performed using the standard protocol following bolus administration of intravenous contrast. RADIATION DOSE REDUCTION: This exam was performed according to the departmental dose-optimization program which includes automated exposure control, adjustment of the mA and/or kV according to patient size and/or use of iterative reconstruction technique. CONTRAST:   OMNIPAQUE IOHEXOL 300 MG/ML  SOLN COMPARISON:  Multiple prior studies, most recently performed 03-23-22 and 10/30/2021. FINDINGS:  Lower chest: Linear atelectasis or scarring at both lung bases, similar to previous studies. No airspace disease or suspicious nodularity. No significant pleural or pericardial effusion. Bilateral breast implants are partially imaged. Mild distal esophageal wall thickening, unchanged. Hepatobiliary: The liver is normal in density without suspicious focal abnormality. There is diffuse contour irregularity of the liver consistent with cirrhosis. Unchanged punctate calcified gallstone. No gallbladder wall thickening, surrounding inflammation or significant biliary dilatation. Pancreas: Unremarkable. No pancreatic ductal dilatation or surrounding inflammatory changes. Spleen: Stable mild to moderate splenomegaly. The spleen measures up to 16.9 cm on sagittal image 83/6. No focal splenic abnormalities are identified. Adrenals/Urinary Tract: Both adrenal glands appear normal. No evidence of urinary tract calculus, suspicious renal lesion or hydronephrosis. Stable mild left renal cortical scarring and atrophy. The bladder appears unremarkable for its degree of distention. Stomach/Bowel: Enteric contrast has passed into the proximal colon. The stomach appears unremarkable for its degree of distension. No evidence of bowel wall thickening, distention or surrounding inflammatory change. Mildly prominent stool throughout the colon. Vascular/Lymphatic: There are no enlarged abdominal or pelvic lymph nodes. No acute vascular findings are demonstrated. There is mild aortic and branch vessel atherosclerosis without evidence of aneurysm or large vessel occlusion. Retroaortic left renal vein noted. Reproductive: Hysterectomy. Left pelvic soft tissue nodule adjacent to the vaginal cuff and sigmoid colon is unchanged from the most recent prior studies, measuring 3.1 x 2.2 cm on image 73/2. As  previously discussed, this has mildly enlarged compared with older prior studies in 2022. Other: Stable postsurgical changes in the anterior abdominal wall. Mesenteric soft tissue nodule adjacent to the small bowel measuring 1.4 x 1.0 cm on image 54/2 is unchanged. No progressive peritoneal nodularity or recurrent ascites identified. No evidence of pneumoperitoneum or focal extraluminal fluid collection. Musculoskeletal: No acute or significant osseous findings. Stable lower lumbar spondylosis with chronic superior endplate Schmorl's nodes at L3 and L4. IMPRESSION: 1. Stable abdominopelvic CT without evidence of progressive peritoneal nodularity or recurrent ascites. 2. Stable soft tissue nodules in the left pelvis and small-bowel mesentery, likely stable residual peritoneal disease. No evidence of progressive metastatic disease. 3. Stable findings of cirrhosis and portal hypertension. 4. Cholelithiasis without evidence of cholecystitis or biliary dilatation. 5.  Aortic Atherosclerosis (ICD10-I70.0). Electronically Signed   By: Carey Bullocks M.D.   On: 07/10/2022 14:08

## 2022-07-11 NOTE — Assessment & Plan Note (Signed)
Her imaging study in May were reviewed which showed stable disease Even though she has clinically measurable disease, she have no evidence of disease progression or new lesions Overall, she tolerated maintenance Zejula well even at reduced dose I recommend we continue treatment indefinitely for now I plan to repeat imaging study again in September

## 2022-07-11 NOTE — Assessment & Plan Note (Signed)
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 

## 2022-07-11 NOTE — Assessment & Plan Note (Signed)
She has chronic lower back pain, stable on prescribed hydrocodone I refilled her prescription today 

## 2022-07-16 ENCOUNTER — Other Ambulatory Visit (HOSPITAL_COMMUNITY): Payer: Self-pay

## 2022-07-17 ENCOUNTER — Telehealth: Payer: Self-pay

## 2022-07-17 LAB — CA 125

## 2022-07-17 NOTE — Telephone Encounter (Signed)
Received secure chat from [facility], [facility] stating that latest CA125 came back "insufficient quantity" and would need to be redrawn. Per MD, redraw unnecessary at this time d/t latest CT scan result and can redraw at next appt. Pt verbalized understanding.

## 2022-07-18 ENCOUNTER — Other Ambulatory Visit (HOSPITAL_COMMUNITY): Payer: Self-pay

## 2022-07-18 ENCOUNTER — Other Ambulatory Visit: Payer: Medicare Other

## 2022-08-09 ENCOUNTER — Other Ambulatory Visit: Payer: Self-pay

## 2022-08-12 ENCOUNTER — Other Ambulatory Visit (HOSPITAL_COMMUNITY): Payer: Self-pay

## 2022-09-02 ENCOUNTER — Other Ambulatory Visit: Payer: Self-pay

## 2022-09-02 ENCOUNTER — Other Ambulatory Visit (HOSPITAL_COMMUNITY): Payer: Self-pay

## 2022-09-02 ENCOUNTER — Other Ambulatory Visit: Payer: Self-pay | Admitting: Hematology and Oncology

## 2022-09-02 MED ORDER — NIRAPARIB TOSYLATE 100 MG PO TABS
100.0000 mg | ORAL_TABLET | Freq: Every day | ORAL | 9 refills | Status: DC
  Filled 2022-09-02: qty 30, 30d supply, fill #0
  Filled 2022-10-03: qty 30, 30d supply, fill #1
  Filled 2022-10-28: qty 30, 30d supply, fill #2
  Filled 2022-11-27: qty 30, 30d supply, fill #3
  Filled 2022-12-20: qty 30, 30d supply, fill #4
  Filled 2023-01-22: qty 30, 30d supply, fill #5
  Filled 2023-02-18: qty 30, 30d supply, fill #6
  Filled 2023-03-18: qty 30, 30d supply, fill #7
  Filled 2023-04-18: qty 30, 30d supply, fill #8
  Filled 2023-06-05: qty 30, 30d supply, fill #9

## 2022-09-05 ENCOUNTER — Other Ambulatory Visit (HOSPITAL_COMMUNITY): Payer: Self-pay

## 2022-09-06 ENCOUNTER — Other Ambulatory Visit: Payer: Self-pay | Admitting: Hematology and Oncology

## 2022-09-10 ENCOUNTER — Encounter: Payer: Self-pay | Admitting: Hematology and Oncology

## 2022-09-10 ENCOUNTER — Inpatient Hospital Stay: Payer: Medicare Other | Attending: Hematology and Oncology | Admitting: Hematology and Oncology

## 2022-09-10 ENCOUNTER — Other Ambulatory Visit: Payer: Self-pay

## 2022-09-10 ENCOUNTER — Inpatient Hospital Stay: Payer: Medicare Other

## 2022-09-10 ENCOUNTER — Telehealth: Payer: Self-pay | Admitting: Hematology and Oncology

## 2022-09-10 VITALS — BP 139/84 | HR 69 | Temp 97.7°F | Resp 18 | Ht 62.0 in | Wt 179.6 lb

## 2022-09-10 DIAGNOSIS — M549 Dorsalgia, unspecified: Secondary | ICD-10-CM | POA: Diagnosis not present

## 2022-09-10 DIAGNOSIS — K5909 Other constipation: Secondary | ICD-10-CM

## 2022-09-10 DIAGNOSIS — C482 Malignant neoplasm of peritoneum, unspecified: Secondary | ICD-10-CM | POA: Diagnosis not present

## 2022-09-10 DIAGNOSIS — R188 Other ascites: Secondary | ICD-10-CM | POA: Insufficient documentation

## 2022-09-10 DIAGNOSIS — Z79899 Other long term (current) drug therapy: Secondary | ICD-10-CM | POA: Diagnosis not present

## 2022-09-10 DIAGNOSIS — I700 Atherosclerosis of aorta: Secondary | ICD-10-CM | POA: Diagnosis not present

## 2022-09-10 DIAGNOSIS — R29818 Other symptoms and signs involving the nervous system: Secondary | ICD-10-CM | POA: Insufficient documentation

## 2022-09-10 DIAGNOSIS — J90 Pleural effusion, not elsewhere classified: Secondary | ICD-10-CM | POA: Insufficient documentation

## 2022-09-10 DIAGNOSIS — R161 Splenomegaly, not elsewhere classified: Secondary | ICD-10-CM | POA: Diagnosis not present

## 2022-09-10 DIAGNOSIS — K5900 Constipation, unspecified: Secondary | ICD-10-CM | POA: Insufficient documentation

## 2022-09-10 DIAGNOSIS — Z9221 Personal history of antineoplastic chemotherapy: Secondary | ICD-10-CM | POA: Insufficient documentation

## 2022-09-10 DIAGNOSIS — G8929 Other chronic pain: Secondary | ICD-10-CM | POA: Insufficient documentation

## 2022-09-10 DIAGNOSIS — K7460 Unspecified cirrhosis of liver: Secondary | ICD-10-CM | POA: Insufficient documentation

## 2022-09-10 DIAGNOSIS — Z5111 Encounter for antineoplastic chemotherapy: Secondary | ICD-10-CM

## 2022-09-10 LAB — CBC WITH DIFFERENTIAL/PLATELET
Abs Immature Granulocytes: 0.01 10*3/uL (ref 0.00–0.07)
Basophils Absolute: 0 10*3/uL (ref 0.0–0.1)
Basophils Relative: 1 %
Eosinophils Absolute: 0.1 10*3/uL (ref 0.0–0.5)
Eosinophils Relative: 2 %
HCT: 37.5 % (ref 36.0–46.0)
Hemoglobin: 12.6 g/dL (ref 12.0–15.0)
Immature Granulocytes: 0 %
Lymphocytes Relative: 26 %
Lymphs Abs: 0.9 10*3/uL (ref 0.7–4.0)
MCH: 31.6 pg (ref 26.0–34.0)
MCHC: 33.6 g/dL (ref 30.0–36.0)
MCV: 94 fL (ref 80.0–100.0)
Monocytes Absolute: 0.3 10*3/uL (ref 0.1–1.0)
Monocytes Relative: 7 %
Neutro Abs: 2.2 10*3/uL (ref 1.7–7.7)
Neutrophils Relative %: 64 %
Platelets: 91 10*3/uL — ABNORMAL LOW (ref 150–400)
RBC: 3.99 MIL/uL (ref 3.87–5.11)
RDW: 13.8 % (ref 11.5–15.5)
WBC: 3.5 10*3/uL — ABNORMAL LOW (ref 4.0–10.5)
nRBC: 0 % (ref 0.0–0.2)

## 2022-09-10 LAB — COMPREHENSIVE METABOLIC PANEL
ALT: 20 U/L (ref 0–44)
AST: 21 U/L (ref 15–41)
Albumin: 4.2 g/dL (ref 3.5–5.0)
Alkaline Phosphatase: 88 U/L (ref 38–126)
Anion gap: 9 (ref 5–15)
BUN: 18 mg/dL (ref 8–23)
CO2: 25 mmol/L (ref 22–32)
Calcium: 9.2 mg/dL (ref 8.9–10.3)
Chloride: 108 mmol/L (ref 98–111)
Creatinine, Ser: 0.99 mg/dL (ref 0.44–1.00)
GFR, Estimated: 60 mL/min — ABNORMAL LOW (ref >60)
Glucose, Bld: 88 mg/dL (ref 70–99)
Potassium: 4.2 mmol/L (ref 3.5–5.1)
Sodium: 142 mmol/L (ref 135–145)
Total Bilirubin: 0.7 mg/dL (ref 0.3–1.2)
Total Protein: 7.2 g/dL (ref 6.5–8.1)

## 2022-09-10 MED ORDER — HYDROCODONE-ACETAMINOPHEN 5-325 MG PO TABS: 1.0000 | ORAL_TABLET | Freq: Four times a day (QID) | ORAL | 0 refills | Status: DC | PRN

## 2022-09-10 MED ORDER — SODIUM CHLORIDE 0.9% FLUSH
10.0000 mL | Freq: Once | INTRAVENOUS | Status: AC
  Administered 2022-09-10: 10 mL

## 2022-09-10 MED ORDER — HEPARIN SOD (PORK) LOCK FLUSH 100 UNIT/ML IV SOLN
500.0000 [IU] | Freq: Once | INTRAVENOUS | Status: AC
  Administered 2022-09-10: 500 [IU]

## 2022-09-10 NOTE — Assessment & Plan Note (Signed)
She has no neurological deficit with loss of taste and smell sensation I recommend the patient to reach out to her neurologist for urgent evaluation

## 2022-09-10 NOTE — Progress Notes (Signed)
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[facility]) Her imaging study in May were reviewed which showed stable disease Even though she has clinically measurable disease, she have no evidence of disease progression or new lesions Overall, she tolerated maintenance Zejula well even at reduced dose I recommend we continue treatment indefinitely for now I plan to repeat imaging study again in November  Other constipation She has severe chronic constipation Her pain medicine likely exacerbate her constipation We discussed importance of aggressive laxative therapy and the made the patient to promise to take MiraLAX and Senokot on a daily basis  Neurological deficit present She has no neurological deficit with loss of taste and smell sensation I recommend the patient to reach out to her neurologist for urgent evaluation  Chronic back pain greater than 3 months duration She has chronic lower back pain, stable on prescribed hydrocodone I refilled her prescription today  No orders of the defined types were placed in this encounter.   All questions were answered. The patient knows to call the clinic with any problems, questions or concerns. The total time spent in the appointment was 30 minutes encounter with patients including review of chart and various tests results, discussions about plan of care and coordination of care plan   Artis Delay, MD 09/10/2022 10:12 AM  INTERVAL HISTORY: Please see below for problem oriented charting. she returns for treatment follow-up on niraparib The patient has severe chronic constipation with interval lasting 4 days She denies recent nausea close signs or symptoms of bowel obstruction She has noticed lack of taste and loss of smell sensation  REVIEW OF SYSTEMS:    Constitutional: Denies fevers, chills or abnormal weight loss Eyes: Denies blurriness of vision Ears, nose, mouth, throat, and face: Denies mucositis or sore throat Respiratory: Denies cough, dyspnea or wheezes Cardiovascular: Denies palpitation, chest discomfort or lower extremity swelling Skin: Denies abnormal skin rashes Lymphatics: Denies new lymphadenopathy or easy bruising Behavioral/Psych: Mood is stable, no new changes  All other systems were reviewed with the patient and are negative.  I have reviewed the past medical history, past surgical history, social history and family history with the patient and they are unchanged from previous note.  ALLERGIES:  is allergic to carboplatin, shellfish allergy, vancomycin, benadryl [diphenhydramine hcl], penicillins, and tape.  MEDICATIONS:  Current Outpatient Medications  Medication Sig Dispense Refill   insulin glargine, 1 Unit Dial, (TOUJEO SOLOSTAR) 300 UNIT/ML Solostar Pen Inject 30 Units into the skin daily.     polyethylene glycol (MIRALAX / GLYCOLAX) 17 g packet Take 17 g by mouth daily.     senna (SENOKOT) 8.6 MG tablet Take 2 tablets by mouth 2 (two) times daily.     calcium elemental as carbonate (BARIATRIC TUMS ULTRA) 400 MG chewable tablet Chew 1 tablet by mouth 2 (two) times daily.     Cholecalciferol (VITAMIN D) 50 MCG (2000 UT) tablet Take 2,000 Units by mouth at bedtime.     gabapentin (NEURONTIN) 300 MG capsule TAKE 3 CAPSULES BY MOUTH TWICE DAILY 120 capsule 11   HYDROcodone-acetaminophen (NORCO/VICODIN) 5-325 MG tablet Take 1 tablet by mouth every 6 (six) hours as needed for moderate pain. 60 tablet 0   lidocaine-prilocaine (EMLA) cream Apply to Porta-cath  1-2 hours prior to access as directed. (Patient taking differently: Apply 1 application  topically  daily as needed (port access).) 30 g 9   NEUPRO 4 MG/24HR APPLY 1 PATCH TOPICALLY TO THE SKIN DAILY 90 patch 0   niraparib tosylate (ZEJULA) 100 MG tablet Take 1 tablet  (100 mg total) by mouth daily. 30 tablet 9   ondansetron (ZOFRAN) 8 MG tablet TAKE 1 TABLET(8 MG) BY MOUTH EVERY 8 HOURS AS NEEDED 60 tablet 1   OZEMPIC, 0.25 OR 0.5 MG/DOSE, 2 MG/1.5ML SOPN Inject 0.25 mg into the skin every Saturday.     pantoprazole (PROTONIX) 40 MG tablet Take 1 tablet (40 mg total) by mouth daily.     No current facility-administered medications for this visit.    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 Had allergic reaction to carboplatin. Avastin was stopped due to GI hemorrhage   Primary peritoneal carcinomatosis (HCC)  09/06/2011 Imaging   CT findings consistent with cirrhotic changes involving the liver.  No worrisome liver mass.  There are associated portal venous collaterals and splenomegaly consistent with portal venous hypertension. 2.  No other significant upper abdominal findings   11/08/2013 Imaging   US showed ascites   11/08/2013 Initial Diagnosis   Patient had ~ 2 months of some urinary incontinence, saw Dr Billy Coast in 10-2013, with pelvic mass on exam    11/12/2013 Imaging   There are findings of progressive hepatic cirrhosis with a large amount of ascites and mild splenomegaly. There are venous collaterals present. 2. There is abnormal thickening of the peritoneal surface along the left mid and lower abdominal wall. There is abnormal soft tissue density in the pelvis as well which could reflect the clinically suspected ovarian malignancy but the findings are nondiagnostic. 3. There is a new small left pleural effusion. 4. There is no acute bowel abnormality.   11/18/2013 Imaging   Successful ultrasound guided diagnostic and therapeutic paracentesis yielding 4.1 liters of ascites.     11/18/2013 Pathology Results   PERITONEAL/ASCITIC FLUID (SPECIMEN 1 OF 1 COLLECTED 11/18/13): SEROUS CARCINOMA, PLEASE SEE COMMENT.   12/01/2013 Tumor Marker    Patient's tumor was tested for the following markers: CA125 Results of the tumor marker test revealed 1938   12/03/2013 Imaging   Successful ultrasound-guided therapeutic paracentesis yielding 3.6 liters of peritoneal fluid.   12/07/2013 - 05/30/2014 Chemotherapy   She received 3 cycles of carboplatin and Taxol, interrupted cycle 4 for surgery.  She subsequently completed 3 more cycles of chemotherapy after surgery   12/20/2013 Imaging   Successful ultrasound-guided diagnostic and therapeutic paracentesis yielding 2.2 liters of peritoneal fluid   12/20/2013 Pathology Results   PERITONEAL/ASCITIC FLUID(SPECIMEN 1 OF 1 COLLECTED 12/20/13): MALIGNANT CELLS CONSISTENT WITH SEROUS CARCINOMA   01/13/2014 Tumor Marker   Patient's tumor was tested for the following markers: CA125 Results of the tumor marker test revealed 227   02/07/2014 Tumor Marker   Patient's tumor was tested for the following markers: CA125 Results of the tumor marker test revealed 130   02/15/2014 Genetic Testing   Genetics testing from 02-2014 normal (OvaNext panel)   02/23/2014 Imaging   Interval decrease in ascites. 2. Morphologic changes in the liver consistent with cirrhosis. 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 Portal vein remains patent at this time. 3. Persistent but decreased abnormal soft tissue attenuation tracking in the omental fat. This may be secondary to interval improvement in metastatic disease. 4. Peritoneal thickening seen in the left abdomen and pelvis on the previous study  has decreased and nearly resolved in the interval. This also suggests interval improvement in metastatic disease.     03/07/2014 Procedure   Ultrasound and fluoroscopically guided right internal jugular single lumen power port catheter insertion. Tip in the SVC/RA junction. Catheter ready for use.   03/17/2014 Tumor Marker   Patient's tumor was tested for the following markers: CA125 Results of the  tumor marker test revealed 45   03/29/2014 Pathology Results   1. Omentum, resection for tumor - HIGH GRADE SEROUS CARCINOMA, SEE COMMENT. 2. Ovary and fallopian tube, right - HIGH GRADE SEROUS CARCINOMA, SEE COMMENT. 3. Ovary and fallopian tube, left - HIGH GRADE SEROUS CARCINOMA, SEE COMMENT. Diagnosis Note 1. Nests and clusters of malignant cells are invading the omental tissue (part #1) with associated fibrosis. The cells are pleomorphic with prominent nucleoli. There are scattered psammoma bodies. The ovaries are atrophic and exhibit multiple foci of surface based invasive carcinoma and associated fibrosis. The fallopian tubes have a few foci of carcinoma, also superficially located. There are no precursor lesions noted in the ovaries or fallopian tubes (the entire tubes and ovaries were submitted for evaluation). While there is some retraction artifact, there are several foci suspicious for lymphovascular invasion. 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 Given the fibrosis, there does appear to be a small amount of treatment effect, however, there is abundant residual tumor.    03/29/2014 Surgery   Procedure(s) Performed: 1. Exploratory laparotomy with bilateral salpingo-oophorectomy, omentectomy radical tumor debulking for ovarian cancer .   Surgeon: Luisa Dago, MD.   Assistant Surgeon: Antionette Char, M.D. Assistant: (an MD assistant was necessary for tissue manipulation, retraction and positioning due to the complexity of the case and hospital policies).  Operative Findings: 10cm omental cake from hepatic to splenic flexure, densely adherent to transverse colon. Milliary studding of tumor implants (<58mm, too numerous in number to count) adherent to the mesentery of the small bowel and small bowel wall. Small volume (100cc) ascites. Small ovaries  bilaterally, densely adherent to the pelvic cul de sac. Left ovary and tube densely adherent to sigmoid colon. Cirrhotic liver with hepatomegaly and splenomegaly.     This represented an optimal cytoreduction (R1) with visible disease residual on the bowel wall and mesentery (millial, <37mm implants).      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 2. 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     08/18/2014 Tumor Marker   Patient's tumor was tested for the following markers: CA125 Results of the tumor marker test revealed 16   10/13/2014 Tumor Marker   Patient's tumor was tested for the following markers: CA125 Results of the tumor marker test revealed 14   11/21/2014 Tumor Marker   Patient's tumor was tested for the following markers: CA125 Results of the tumor marker test revealed 16   12/28/2014 Tumor Marker   Patient's tumor was tested for  the following markers: CA125 Results of the tumor marker test revealed 762   02/27/2015 Tumor Marker   Patient's tumor was tested for the following markers: CA125 Results of the tumor marker test revealed 20.2   03/22/2015 Imaging   Filler appearance to the prior exam, with very subtle fluid tracking along portions of the liver, and very minimal nodularity along the right paracolic gutter representing residua from the prior peritoneal cancer. The current abnormalities could simply be post therapy findings rather than necessarily representing residual malignancy. No  new or enlarging lesions are identified. 2. Hepatic cirrhosis and splenomegaly. There is some gastric varices suggesting portal venous hypertension. 3. Left foraminal stenosis at L4-5 due to spurring. There is likely also some central narrowing of the thecal sac at this level. 4. Bibasilar scarring. 5. Chronic mild left mid kidney scarring   03/27/2015 Tumor Marker   Patient's tumor was tested for the following markers: CA125 Results of the tumor marker test revealed 25.3   04/24/2015 Imaging   Disc bulge L2-3 with mild spinal stenosis and right foraminal encroachment. 2. Disc bulge L3-4 with mild spinal stenosis. 3. Multifactorial spinal, left lateral recess and foraminal stenosis L4-5. There is grade 1 anterolisthesis without evident dynamic instability.   05/29/2015 Tumor Marker   Patient's tumor was tested for the following markers: CA125 Results of the tumor marker test revealed 29.9   08/21/2015 Tumor Marker   Patient's tumor was tested for the following markers: CA125 Results of the tumor marker test revealed 45   09/18/2015 Tumor Marker   Patient's tumor was tested for the following markers: CA125 Results of the tumor marker test revealed 47   09/27/2015 Imaging   New small bowel mesenteric nodule and enlarged left external iliac lymph node, highly worrisome for metastatic disease. 2. Cirrhosis and splenomegaly   10/10/2015 Tumor Marker   Patient's tumor was tested for the following markers: CA125 Results of the tumor marker test revealed 53.4   10/10/2015 - 12/11/2015 Chemotherapy   She received 4 cycles of carboplatin only   11/20/2015 Tumor Marker   Patient's tumor was tested for the following markers: CA125 Results of the tumor marker test revealed 41.3   12/20/2015 Imaging   CT: Mild left external iliac lymphadenopathy is slightly decreased. Mildly enlarged lower mesenteric nodule is slightly decreased. 2. No new or progressive metastatic disease in the abdomen or pelvis. 3.  Cirrhosis. Stable subcentimeter hypodense left liver lobe lesion. No new liver lesions. 4. Stable mild splenomegaly.  No ascites. 5. Mild sigmoid diverticulosis.   01/01/2016 -  Anti-estrogen oral therapy   She was placed on tamoxifen. Had letrozole initially but switched to tamoxifen due to poor tolerance   01/15/2016 Tumor Marker   Patient's tumor was tested for the following markers: CA125 Results of the tumor marker test revealed 31.4   02/19/2016 Tumor Marker   Patient's tumor was tested for the following markers: CA125 Results of the tumor marker test revealed 36.5   05/13/2016 Imaging   No evidence of disease progression within the abdomen or pelvis. Previously noted small central mesenteric nodule and left external iliac node appear slightly smaller. 2. Stable changes of hepatic cirrhosis and portal hypertension with associated splenomegaly. No new or enlarging hepatic lesions are identified. 3. No acute findings.   05/13/2016 Tumor Marker   Patient's tumor was tested for the following markers: CA125 Results of the tumor marker test revealed 41.2   06/24/2016 Tumor Marker   Patient's tumor was tested for the following  markers: CA125 Results of the tumor marker test revealed 47.3   08/20/2016 Imaging   1. The left external iliac lymph node is slightly increased in size compared to the prior exam, currently 1.1 cm and previously 0.9 cm in diameter. 2. Stable central mesenteric lymph node at 0.9 cm in diameter. 3. Stable trace free pelvic fluid and stable slight thickening along the right paracolic gutter, without well-defined peritoneal nodularity. 4. Other imaging findings of potential clinical significance: Subsegmental atelectasis or scarring in the lung bases. Hepatic cirrhosis. Left renal scarring. Mild splenomegaly. Sigmoid colon diverticulosis. Impingement at L4-5 due to spondylosis and degenerative disc disease broad Schmorl' s nodes at L3-L4. Pelvic floor laxity   08/23/2016 Tumor  Marker   Patient's tumor was tested for the following markers: CA125 Results of the tumor marker test revealed 80.2   09/04/2016 Imaging   LV EF: 60% -   65%   09/12/2016 Tumor Marker   Patient's tumor was tested for the following markers: CA125 Results of the tumor marker test revealed 98.5   09/12/2016 - 01/29/2018 Chemotherapy   The patient received Doxil and Avastin. Avastin was discontinued in Dec 2019 due to GI hemorrhage   09/26/2016 Tumor Marker   Patient's tumor was tested for the following markers: CA125 Results of the tumor marker test revealed 68.9   10/10/2016 Tumor Marker   Patient's tumor was tested for the following markers: CA125 Results of the tumor marker test revealed 65.6   11/07/2016 Tumor Marker   Patient's tumor was tested for the following markers: CA125 Results of the tumor marker test revealed 46.4   11/29/2016 Imaging   Stable mild peritoneal thickening, suspicious for peritoneal carcinomatosis. No new or progressive disease identified. No evidence of ascites.  No significant change in 10 mm left external iliac and 8 mm mesenteric lymph nodes.  Stable hepatic cirrhosis, and splenomegaly consistent with portal venous hypertension.  Colonic diverticulosis, without radiographic evidence of diverticulitis.   12/02/2016 Imaging   Normal LV size with EF 55%. Basal inferior and basal inferoseptal hypokinesis. Normal RV size and systolic function. No significant valvular abnormalities.   12/05/2016 Tumor Marker   Patient's tumor was tested for the following markers: CA125 Results of the tumor marker test revealed 49.1   01/09/2017 Tumor Marker   Patient's tumor was tested for the following markers: CA125 Results of the tumor marker test revealed 47.2   02/06/2017 Tumor Marker   Patient's tumor was tested for the following markers: CA125 Results of the tumor marker test revealed 44.1   02/18/2017 Imaging   Stable mild peritoneal thickening. No new or  progressive disease identified within the abdomen or pelvis.  Stable tiny sub-cm left external iliac and mesenteric lymph nodes.  Stable hepatic cirrhosis, and splenomegaly consistent with portal venous hypertension. No evidence of hepatic neoplasm.  Colonic diverticulosis, without radiographic evidence of diverticulitis.   03/06/2017 Tumor Marker   Patient's tumor was tested for the following markers: CA125 Results of the tumor marker test revealed 50.4   03/17/2017 Imaging   - Left ventricle: The cavity size was normal. Wall thickness was normal. Systolic function was normal. The estimated ejection fraction was in the range of 55% to 60%. Wall motion was normal; there were no regional wall motion abnormalities. Features are consistent with a pseudonormal left ventricular filling pattern, with concomitant abnormal relaxation and increased filling pressure (grade 2 diastolic dysfunction). - Mitral valve: There was mild regurgitation. - Left atrium: The atrium was mildly dilated   04/03/2017  Tumor Marker   Patient's tumor was tested for the following markers: CA125 Results of the tumor marker test revealed 47.2   05/14/2017 Imaging   CT scan of abdomen and pelvis Stable mild peritoneal thickening. No new or progressive disease within the abdomen or pelvis.  Stable hepatic cirrhosis. Stable splenomegaly, consistent with portal venous hypertension. No evidence of hepatic neoplasm.   05/15/2017 Tumor Marker   Patient's tumor was tested for the following markers: CA125 Results of the tumor marker test revealed 56.1   06/12/2017 Tumor Marker   Patient's tumor was tested for the following markers: CA125 Results of the tumor marker test revealed 49.2   07/10/2017 Tumor Marker   Patient's tumor was tested for the following markers: CA125 Results of the tumor marker test revealed 46.3   08/07/2017 Tumor Marker   Patient's tumor was tested for the following markers: CA125 Results of the tumor  marker test revealed 52.9   08/20/2017 Imaging   1. Mild omental/peritoneal haziness, unchanged. No evidence of new metastatic disease. 2. Cirrhosis with splenomegaly.   09/04/2017 Tumor Marker   Patient's tumor was tested for the following markers: CA125 Results of the tumor marker test revealed 48.5   11/13/2017 Tumor Marker   Patient's tumor was tested for the following markers: CA125 Results of the tumor marker test revealed 55.8   12/17/2017 Imaging   1. No findings to suggest metastatic disease in the abdomen or pelvis. 2. Severe hepatic cirrhosis with evidence of portal hypertension, as demonstrated by dilated portal vein, splenomegaly and portosystemic collateral pathways, including gastric and esophageal varices.  3. Colonic diverticulosis without evidence of acute diverticulitis at this time. 4. Additional incidental findings, as above.   01/16/2018 Tumor Marker   Patient's tumor was tested for the following markers: CA125 Results of the tumor marker test revealed 63.7   02/10/2018 Procedure   EGD - Recently bleeding grade III and large (> 5 mm) esophageal varices. Completely eradicated. Banded. - Portal hypertensive gastropathy. - Normal examined duodenum. - No specimens collected   02/13/2018 Tumor Marker   Patient's tumor was tested for the following markers: CA125 Results of the tumor marker test revealed 82.7   02/25/2018 Imaging   Bone density showed mild osteopenia   04/08/2018 Tumor Marker   Patient's tumor was tested for the following markers: CA125 Results of the tumor marker test revealed 82.7   04/08/2018 Imaging   1. No definite omental or peritoneal surface lesions. However, there are 2 slowly enlarging lymph nodes noted. One is in the small bowel mesentery and the other is in the left deep pelvis. Could not exclude recurrent disease. 2. Stable advanced cirrhotic changes involving the liver with portal venous hypertension, portal venous collaterals,  esophageal varices and splenomegaly. No worrisome hepatic lesions. 3. Diffuse wall thickening of the colon could suggest diffuse colitis or could be due to low albumin. Recommend correlation with any symptoms such as diarrhea.   05/21/2018 Tumor Marker   Patient's tumor was tested for the following markers: CA125 Results of the tumor marker test revealed 86   09/07/2018 Tumor Marker   Patient's tumor was tested for the following markers:CA-125 Results of the tumor marker test revealed 111   09/29/2018 Tumor Marker   Patient's tumor was tested for the following markers: CA-125 Results of the tumor marker test revealed 121.   09/29/2018 - 11/24/2018 Chemotherapy   The patient had carboplatin and gemzar for chemotherapy treatment.  Chemo was stopped due to allergic reaction to carboplatin  10/27/2018 Tumor Marker   Patient's tumor was tested for the following markers: CA-125 Results of the tumor marker test revealed 99.9   11/24/2018 Tumor Marker   Patient's tumor was tested for the following markers: CA-125 Results of the tumor marker test revealed 81.2   12/14/2018 Tumor Marker   Patient's tumor was tested for the following markers: CA-125 Results of the tumor marker test revealed 81.2   12/14/2018 Imaging   1. Mild improvement in peritoneal disease. Peritoneal nodule within the left posterior pelvis contiguous with the left side of vaginal cuff is slightly decreased in size in the interval. Within the right iliac fossa there is a small peritoneal nodule which has decreased in size in the interval. Central small bowel mesenteric lymph node is not significantly changed. Slight decrease in size left periaortic lymph node.    2. Morphologic features of the liver compatible with cirrhosis. Splenomegaly is noted which may reflect portal venous hypertension.   12/21/2018 Tumor Marker   Patient's tumor was tested for the following markers: CA-125 Results of the tumor marker test revealed  64.2   12/21/2018 -  Chemotherapy   The patient had taxotere for chemotherapy treatment.     03/02/2019 Tumor Marker   Patient's tumor was tested for the following markers: CA-125. 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   No new or progressive metastatic disease within the abdomen or pelvis.   Hepatic cirrhosis and findings of portal venous hypertension. 1.1 cm low-attenuation lesion within left hepatic lobe, which could represent a regenerative nodule or small hepatocellular carcinoma. Recommend abdomen MRI without and with contrast for further characterization.     04/13/2019 Tumor Marker   Patient's tumor was tested for the following markers: CA-125 Results of the tumor marker test revealed 36.8   05/26/2019 Tumor Marker   Patient's tumor was tested for the following markers: CA-125 Results of the tumor marker test revealed 31.6.   06/15/2019 Tumor Marker   Patient's tumor was tested for the following markers: CA-125 Results of the tumor marker test revealed 25.4   07/05/2019 Imaging   1. Stable CT examination. Stable peritoneal thickening in the pelvis and paracolic gutters. Stable solid left deep pelvic implant. Stable trace ascites. No new or progressive metastatic disease. 2. Stable cirrhosis. No discrete liver masses. 3. Stable mild-to-moderate splenomegaly. 4. Aortic Atherosclerosis (ICD10-I70.0).   07/27/2019 Tumor Marker   Patient's tumor was tested for the following markers: CA-125 Results of the tumor marker test revealed 35.4   08/17/2019 Tumor Marker   Patient's tumor was tested for the following markers: Ca-125 Results of the tumor marker test revealed 35.7.   09/17/2019 - 09/21/2019 Hospital Admission   She was admitted due to esophageal variceal bleeding presents with hematemesis and melanotic stool with epigastric pain.  GI consulted, started on octreotide infusion.   Underwent EGD which showed minimal bleeding, remain on octreotide drip, PPI drip.  Hemoglobin trended down therefore received PRBC transfusion.  Over several days her bleeding subsided, hemodynamically remained stable.    09/17/2019 Procedure   EGD report - Esophageal mucosal scarring, from prior esophageal banding sessions. - Grade I esophageal varices. - Red blood in the cardia and in the gastric fundus. - Portal hypertensive gastropathy. - Normal duodenal bulb, first portion of the duodenum and second portion of the duodenum. - No obvious source of bleeding identified; no ongoing/active bleeding seen during our exam; possible explanations  for bleeding include: variceal bleeding (gastric or esophageal) that had resolved and varices flattened with medical therapy versus dieulafoy lesion versus portal gastropathy bleeding versus other   11/05/2019 Imaging   1. 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 2. Otherwise stable CT exam. Stable peritoneal thickening within the bilateral deep pelvic peritoneum. Stable left deep pelvic implant.No evidence for progressed disease. 3. Stable splenomegaly. 4. Morphologic changes to the liver compatible with cirrhosis. Trace perihepatic and pelvic ascites.   02/22/2020 Imaging   1. New lucent lesion in the left side of the L4 vertebral body measuring 1 cm, concerning for possible osseous metastasis. Consideration for further evaluation with bone scan is suggested. Close attention on follow-up imaging is also recommended to ensure stability. 2. Previously noted peritoneal thickening and nodularity in the low anatomic pelvis has regressed slightly. 3. Borderline enlarged and mildly enlarged pelvic and retroperitoneal lymph nodes appear similar to slightly increased compared to the prior examination, as above. Continued attention on follow-up studies is recommended.  4. Hepatic cirrhosis with evidence  of portal venous hypertension, including dilated portal vein and splenomegaly. 5. Aortic atherosclerosis. 6. Additional incidental findings, as above.   02/22/2020 Tumor Marker   Patient's tumor was tested for the following markers: CA-125 Results of the tumor marker test revealed 30.1   04/06/2020 Tumor Marker   Patient's tumor was tested for the following markers: CA-125 Results of the tumor marker test revealed 35.7   09/11/2020 Tumor Marker   Patient's tumor was tested for the following markers: CA-125. Results of the tumor marker test revealed 26.6.   09/12/2020 Imaging   1. Slow enlargement of a currently 2.9 cubic cm soft tissue nodule along the left adnexa. Tumor deposit not excluded. 2. The left external iliac lymph node has substantially reduced in size, currently 0.9 cm in short axis and formerly 1.4 cm. 3. 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 4. Cirrhosis with portal venous hypertension. 5. Mild distal esophageal wall thickening, probably from esophagitis. Splenomegaly. 6. Scarring in the left kidney. 7. Sigmoid colon diverticulosis. 8. Pelvic floor laxity with small cystocele. 9. Lumbar impingement at L2-3, L3-4, and L4-5.     10/23/2020 Tumor Marker   Patient's tumor was tested for the following markers: CA-125. Results of the tumor marker test revealed 35.3.   12/12/2020 Tumor Marker   Patient's tumor was tested for the following markers: CA-125. Results of the tumor marker test revealed 39.1.   12/13/2020 Imaging   1. Continued increase in size of soft tissue nodule within the left posterior pelvis adjacent to the sigmoid colon. Cannot exclude small tumor deposit. 2. Similar appearance of thickening and mild nodularity along the peritoneal reflections of the pelvis and pericolic gutters. 3. Morphologic findings of the liver compatible with  cirrhosis. 4. Splenomegaly. 5. Aortic Atherosclerosis (ICD10-I70.0).   01/24/2021 Tumor Marker   Patient's tumor was tested for the following markers: CA-125. Results of the tumor marker test revealed 56.2.   02/09/2021 Imaging   1. Mild interval increased size of the solid left adnexal mass which may represent tumor recurrence/deposit. The mass abuts the adjacent sigmoid colon as well as the left posterior wall of the urinary bladder, invasion not excluded. 2. Otherwise no new mass or lymphadenopathy identified in the abdomen or pelvis. 3. Hepatic cirrhosis. Splenomegaly consistent with portal hypertension.   04/11/2021 Imaging   1. Left posterior pelvic  soft tissue mass, adjacent to the sigmoid colon, has slightly increased in size in comparison to recent prior with significant interval increase in size over more remote priors, findings which are highly suspicious for active neoplastic disease involvement. 2. No new discrete omental or peritoneal nodules/masses identified. 3. Similar nodular thickening of the peritoneum along the bilateral pericolic gutters and in the pelvis without ascites. 4. No pathologically enlarged abdominal or pelvic lymph nodes and no solid organ metastases visualized. 5. Cirrhotic hepatic morphology with evidence of portal hypertension including splenomegaly. 6. Sigmoid colonic diverticulosis without findings of acute diverticulitis. 7. Moderate volume of formed stool throughout the colon. 8. Aortic Atherosclerosis (ICD10-I70.0).   04/11/2021 Tumor Marker   Patient's tumor was tested for the following markers: CA-125. Results of the tumor marker test revealed 79.4 .   04/23/2021 - 10/09/2021 Chemotherapy   Patient is on Treatment Plan : Ovarian Docetaxel q21d     04/24/2021 Tumor Marker   Patient's tumor was tested for the following markers: CA-125. Results of the tumor marker test revealed 68.5.   05/16/2021 Tumor Marker   Patient's tumor was tested for the  following markers: CA-125. Results of the tumor marker test revealed 68.1.   06/28/2021 Tumor Marker   Patient's tumor was tested for the following markers: CA-125. Results of the tumor marker test revealed 52.   07/16/2021 Imaging   1. Stable cirrhotic changes involving the liver. Associated portal venous hypertension, portal venous collaterals and splenomegaly. No hepatic lesions. 2. Slight interval decrease in size of the left pelvic implant. 3. Stable minimal peritoneal nodularity involving the pericolic gutters bilaterally. No new or progressive findings.     08/09/2021 Tumor Marker   Patient's tumor was tested for the following markers: CA-125. Results of the tumor marker test revealed 44.1.   09/20/2021 Tumor Marker   Patient's tumor was tested for the following markers: CA-125. Results of the tumor marker test revealed 38.2.   10/31/2021 Imaging   1. Stable exam. No new or progressive interval findings. 2. Stable appearance of the soft tissue nodule in the left pelvis. 3. Subtle omental nodule is similar to prior. The minimal peritoneal nodularity described previously in the para colic gutters is similar to prior. 4. Cirrhosis with splenomegaly. 5. Cholelithiasis. 6. Left colonic diverticulosis without diverticulitis. 7. Aortic Atherosclerosis (ICD10-I70.0).   10/31/2021 Tumor Marker   Patient's tumor was tested for the following markers: Ca-125. Results of the tumor marker test revealed 37.7.   11/01/2021 - 11/01/2021 Chemotherapy   Patient is on Treatment Plan : BREAST Docetaxel (77) q21d     11/18/2021 -  Chemotherapy   She started on Niraparib   12/03/2021 Tumor Marker   Patient's tumor was tested for the following markers: CA-125. Results of the tumor marker test revealed 45.5.   12/27/2021 Tumor Marker   Patient's tumor was tested for the following markers: CA-125. Results of the tumor marker test revealed 48.3.   01/31/2022 Tumor Marker   Patient's tumor was  tested for the following markers: CA-`125. Results of the tumor marker test revealed 43.6.   02/26/2022 Imaging   Stable soft tissue nodule in the low left hemipelvis in the surgical bed of the previous hysterectomy. Additional small areas of thickening along the abdomen without new ascites. There is 1 small asymmetric area of increased thickening along the posterolateral left hemipelvis as above. Attention to this area on short follow-up.   Nodular liver with fatty infiltration splenomegaly.   Gallstones.   Aortic atherosclerosis ICD10-I 70.0  04/01/2022 Tumor Marker   Patient's tumor was tested for the following markers: CA-125. Results of the tumor marker test revealed 49.7.   05/24/2022 Tumor Marker   Patient's tumor was tested for the following markers: CA-125. Results of the tumor marker test revealed 57.2.   07/11/2022 Imaging   CT ABDOMEN PELVIS W CONTRAST  Result Date: 07/10/2022 CLINICAL DATA:  Ovarian cancer, assess treatment response. Primary peritoneal carcinomatosis. Slowly rising CA 125 levels. * Tracking Code: BO * EXAM: CT ABDOMEN AND PELVIS WITH CONTRAST TECHNIQUE: Multidetector CT imaging of the abdomen and pelvis was performed using the standard protocol following bolus administration of intravenous contrast. RADIATION DOSE REDUCTION: This exam was performed according to the departmental dose-optimization program which includes automated exposure control, adjustment of the mA and/or kV according to patient size and/or use of iterative reconstruction technique. CONTRAST:  OMNIPAQUE IOHEXOL 300 MG/ML  SOLN COMPARISON:  Multiple prior studies, most recently performed 2022/03/16 and 10/30/2021. FINDINGS: Lower chest: Linear atelectasis or scarring at both lung bases, similar to previous studies. No airspace disease or suspicious nodularity. No significant pleural or pericardial effusion. Bilateral breast implants are partially imaged. Mild distal esophageal wall thickening,  unchanged. Hepatobiliary: The liver is normal in density without suspicious focal abnormality. There is diffuse contour irregularity of the liver consistent with cirrhosis. Unchanged punctate calcified gallstone. No gallbladder wall thickening, surrounding inflammation or significant biliary dilatation. Pancreas: Unremarkable. No pancreatic ductal dilatation or surrounding inflammatory changes. Spleen: Stable mild to moderate splenomegaly. The spleen measures up to 16.9 cm on sagittal image 83/6. No focal splenic abnormalities are identified. Adrenals/Urinary Tract: Both adrenal glands appear normal. No evidence of urinary tract calculus, suspicious renal lesion or hydronephrosis. Stable mild left renal cortical scarring and atrophy. The bladder appears unremarkable for its degree of distention. Stomach/Bowel: Enteric contrast has passed into the proximal colon. The stomach appears unremarkable for its degree of distension. No evidence of bowel wall thickening, distention or surrounding inflammatory change. Mildly prominent stool throughout the colon. Vascular/Lymphatic: There are no enlarged abdominal or pelvic lymph nodes. No acute vascular findings are demonstrated. There is mild aortic and branch vessel atherosclerosis without evidence of aneurysm or large vessel occlusion. Retroaortic left renal vein noted. Reproductive: Hysterectomy. Left pelvic soft tissue nodule adjacent to the vaginal cuff and sigmoid colon is unchanged from the most recent prior studies, measuring 3.1 x 2.2 cm on image 73/2. As previously discussed, this has mildly enlarged compared with older prior studies in 2022. Other: Stable postsurgical changes in the anterior abdominal wall. Mesenteric soft tissue nodule adjacent to the small bowel measuring 1.4 x 1.0 cm on image 54/2 is unchanged. No progressive peritoneal nodularity or recurrent ascites identified. No evidence of pneumoperitoneum or focal extraluminal fluid collection.  Musculoskeletal: No acute or significant osseous findings. Stable lower lumbar spondylosis with chronic superior endplate Schmorl's nodes at L3 and L4. IMPRESSION: 1. Stable abdominopelvic CT without evidence of progressive peritoneal nodularity or recurrent ascites. 2. Stable soft tissue nodules in the left pelvis and small-bowel mesentery, likely stable residual peritoneal disease. No evidence of progressive metastatic disease. 3. Stable findings of cirrhosis and portal hypertension. 4. Cholelithiasis without evidence of cholecystitis or biliary dilatation. 5.  Aortic Atherosclerosis (ICD10-I70.0). Electronically Signed   By: Carey Bullocks M.D.   On: 07/10/2022 14:08        PHYSICAL EXAMINATION: ECOG PERFORMANCE STATUS: 1 - Symptomatic but completely ambulatory  Vitals:   09/10/22 0819  BP: 139/84  Pulse: 69  Resp: 18  Temp:  97.7 F (36.5 C)  SpO2: 100%   Filed Weights   09/10/22 0819  Weight: 179 lb 9.6 oz (81.5 kg)    GENERAL:alert, no distress and comfortable NEURO: alert & oriented x 3 with fluent speech, no focal motor/sensory deficits  LABORATORY DATA:  I have reviewed the data as listed    Component Value Date/Time   NA 142 09/10/2022 0736   NA 142 02/06/2017 0742   K 4.2 09/10/2022 0736   K 3.9 02/06/2017 0742   CL 108 09/10/2022 0736   CO2 25 09/10/2022 0736   CO2 22 02/06/2017 0742   GLUCOSE 88 09/10/2022 0736   GLUCOSE 156 (H) 02/06/2017 0742   BUN 18 09/10/2022 0736   BUN 13.5 02/06/2017 0742   CREATININE 0.99 09/10/2022 0736   CREATININE 0.93 03/28/2022 0737   CREATININE 0.8 02/06/2017 0742   CALCIUM 9.2 09/10/2022 0736   CALCIUM 8.5 02/06/2017 0742   PROT 7.2 09/10/2022 0736   PROT 6.9 02/06/2017 0742   ALBUMIN 4.2 09/10/2022 0736   ALBUMIN 3.7 02/06/2017 0742   AST 21 09/10/2022 0736   AST 25 03/28/2022 0737   AST 47 (H) 02/06/2017 0742   ALT 20 09/10/2022 0736   ALT 22 03/28/2022 0737   ALT 54 02/06/2017 0742   ALKPHOS 88 09/10/2022 0736    ALKPHOS 130 02/06/2017 0742   BILITOT 0.7 09/10/2022 0736   BILITOT 0.7 03/28/2022 0737   BILITOT 0.65 02/06/2017 0742   GFRNONAA 60 (L) 09/10/2022 0736   GFRNONAA >60 03/28/2022 0737   GFRAA >60 11/09/2019 0953   GFRAA >60 10/26/2019 0722    No results found for: "SPEP", "UPEP"  Lab Results  Component Value Date   WBC 3.5 (L) 09/10/2022   NEUTROABS 2.2 09/10/2022   HGB 12.6 09/10/2022   HCT 37.5 09/10/2022   MCV 94.0 09/10/2022   PLT 91 (L) 09/10/2022      Chemistry      Component Value Date/Time   NA 142 09/10/2022 0736   NA 142 02/06/2017 0742   K 4.2 09/10/2022 0736   K 3.9 02/06/2017 0742   CL 108 09/10/2022 0736   CO2 25 09/10/2022 0736   CO2 22 02/06/2017 0742   BUN 18 09/10/2022 0736   BUN 13.5 02/06/2017 0742   CREATININE 0.99 09/10/2022 0736   CREATININE 0.93 03/28/2022 0737   CREATININE 0.8 02/06/2017 0742      Component Value Date/Time   CALCIUM 9.2 09/10/2022 0736   CALCIUM 8.5 02/06/2017 0742   ALKPHOS 88 09/10/2022 0736   ALKPHOS 130 02/06/2017 0742   AST 21 09/10/2022 0736   AST 25 03/28/2022 0737   AST 47 (H) 02/06/2017 0742   ALT 20 09/10/2022 0736   ALT 22 03/28/2022 0737   ALT 54 02/06/2017 0742   BILITOT 0.7 09/10/2022 0736   BILITOT 0.7 03/28/2022 0737   BILITOT 0.65 02/06/2017 0742

## 2022-09-10 NOTE — Telephone Encounter (Signed)
Patient is aware of upcoming appointment times/dates.  

## 2022-09-10 NOTE — Assessment & Plan Note (Signed)
Her imaging study in May were reviewed which showed stable disease Even though she has clinically measurable disease, she have no evidence of disease progression or new lesions Overall, she tolerated maintenance Zejula well even at reduced dose I recommend we continue treatment indefinitely for now I plan to repeat imaging study again in November

## 2022-09-10 NOTE — Assessment & Plan Note (Signed)
She has severe chronic constipation Her pain medicine likely exacerbate her constipation We discussed importance of aggressive laxative therapy and the made the patient to promise to take MiraLAX and Senokot on a daily basis

## 2022-09-10 NOTE — Assessment & Plan Note (Signed)
She has chronic lower back pain, stable on prescribed hydrocodone I refilled her prescription today

## 2022-09-16 ENCOUNTER — Telehealth: Payer: Self-pay | Admitting: Neurology

## 2022-09-16 ENCOUNTER — Other Ambulatory Visit: Payer: Self-pay

## 2022-09-16 DIAGNOSIS — G2581 Restless legs syndrome: Secondary | ICD-10-CM

## 2022-09-16 MED ORDER — NEUPRO 4 MG/24HR TD PT24
MEDICATED_PATCH | TRANSDERMAL | 0 refills | Status: DC
Start: 2022-09-16 — End: 2022-11-28

## 2022-09-16 NOTE — Telephone Encounter (Signed)
Pt called requesting prescription for neupro patch

## 2022-09-16 NOTE — Telephone Encounter (Signed)
RX has been sent.

## 2022-10-03 ENCOUNTER — Other Ambulatory Visit (HOSPITAL_COMMUNITY): Payer: Self-pay

## 2022-10-04 ENCOUNTER — Other Ambulatory Visit (HOSPITAL_COMMUNITY): Payer: Self-pay

## 2022-10-28 ENCOUNTER — Other Ambulatory Visit: Payer: Self-pay

## 2022-11-05 ENCOUNTER — Inpatient Hospital Stay: Payer: Medicare Other | Attending: Hematology and Oncology

## 2022-11-05 ENCOUNTER — Inpatient Hospital Stay (HOSPITAL_BASED_OUTPATIENT_CLINIC_OR_DEPARTMENT_OTHER): Payer: Medicare Other | Admitting: Hematology and Oncology

## 2022-11-05 ENCOUNTER — Encounter: Payer: Self-pay | Admitting: Hematology and Oncology

## 2022-11-05 ENCOUNTER — Other Ambulatory Visit: Payer: Self-pay

## 2022-11-05 VITALS — BP 149/74 | HR 69 | Temp 98.1°F | Resp 18 | Ht 62.0 in | Wt 178.2 lb

## 2022-11-05 DIAGNOSIS — D61818 Other pancytopenia: Secondary | ICD-10-CM | POA: Diagnosis not present

## 2022-11-05 DIAGNOSIS — Z7189 Other specified counseling: Secondary | ICD-10-CM | POA: Diagnosis not present

## 2022-11-05 DIAGNOSIS — C482 Malignant neoplasm of peritoneum, unspecified: Secondary | ICD-10-CM

## 2022-11-05 DIAGNOSIS — M545 Low back pain, unspecified: Secondary | ICD-10-CM | POA: Diagnosis not present

## 2022-11-05 DIAGNOSIS — G8929 Other chronic pain: Secondary | ICD-10-CM | POA: Diagnosis not present

## 2022-11-05 DIAGNOSIS — Z5111 Encounter for antineoplastic chemotherapy: Secondary | ICD-10-CM

## 2022-11-05 DIAGNOSIS — R161 Splenomegaly, not elsewhere classified: Secondary | ICD-10-CM | POA: Insufficient documentation

## 2022-11-05 DIAGNOSIS — K7460 Unspecified cirrhosis of liver: Secondary | ICD-10-CM | POA: Diagnosis not present

## 2022-11-05 LAB — CBC WITH DIFFERENTIAL/PLATELET
Abs Immature Granulocytes: 0.01 10*3/uL (ref 0.00–0.07)
Basophils Absolute: 0 10*3/uL (ref 0.0–0.1)
Basophils Relative: 0 %
Eosinophils Absolute: 0.1 10*3/uL (ref 0.0–0.5)
Eosinophils Relative: 2 %
HCT: 37.1 % (ref 36.0–46.0)
Hemoglobin: 12.6 g/dL (ref 12.0–15.0)
Immature Granulocytes: 0 %
Lymphocytes Relative: 24 %
Lymphs Abs: 0.9 10*3/uL (ref 0.7–4.0)
MCH: 32.3 pg (ref 26.0–34.0)
MCHC: 34 g/dL (ref 30.0–36.0)
MCV: 95.1 fL (ref 80.0–100.0)
Monocytes Absolute: 0.3 10*3/uL (ref 0.1–1.0)
Monocytes Relative: 6 %
Neutro Abs: 2.6 10*3/uL (ref 1.7–7.7)
Neutrophils Relative %: 68 %
Platelets: 83 10*3/uL — ABNORMAL LOW (ref 150–400)
RBC: 3.9 MIL/uL (ref 3.87–5.11)
RDW: 14.5 % (ref 11.5–15.5)
WBC: 3.9 10*3/uL — ABNORMAL LOW (ref 4.0–10.5)
nRBC: 0 % (ref 0.0–0.2)

## 2022-11-05 LAB — COMPREHENSIVE METABOLIC PANEL
ALT: 18 U/L (ref 0–44)
AST: 20 U/L (ref 15–41)
Albumin: 4.1 g/dL (ref 3.5–5.0)
Alkaline Phosphatase: 88 U/L (ref 38–126)
Anion gap: 6 (ref 5–15)
BUN: 15 mg/dL (ref 8–23)
CO2: 27 mmol/L (ref 22–32)
Calcium: 8.6 mg/dL — ABNORMAL LOW (ref 8.9–10.3)
Chloride: 108 mmol/L (ref 98–111)
Creatinine, Ser: 0.91 mg/dL (ref 0.44–1.00)
GFR, Estimated: >60 mL/min (ref >60)
Glucose, Bld: 101 mg/dL — ABNORMAL HIGH (ref 70–99)
Potassium: 3.9 mmol/L (ref 3.5–5.1)
Sodium: 141 mmol/L (ref 135–145)
Total Bilirubin: 0.7 mg/dL (ref 0.3–1.2)
Total Protein: 7.3 g/dL (ref 6.5–8.1)

## 2022-11-05 MED ORDER — ONDANSETRON HCL 8 MG PO TABS
ORAL_TABLET | ORAL | 1 refills | Status: DC
Start: 2022-11-05 — End: 2022-12-12

## 2022-11-05 MED ORDER — HEPARIN SOD (PORK) LOCK FLUSH 100 UNIT/ML IV SOLN
500.0000 [IU] | Freq: Once | INTRAVENOUS | Status: AC
  Administered 2022-11-05: 500 [IU]

## 2022-11-05 MED ORDER — HYDROCODONE-ACETAMINOPHEN 5-325 MG PO TABS: 1.0000 | ORAL_TABLET | Freq: Four times a day (QID) | ORAL | 0 refills | Status: DC | PRN

## 2022-11-05 MED ORDER — SODIUM CHLORIDE 0.9% FLUSH
10.0000 mL | Freq: Once | INTRAVENOUS | Status: AC
  Administered 2022-11-05: 10 mL

## 2022-11-05 NOTE — Assessment & Plan Note (Signed)
Her imaging study in May were reviewed which showed stable disease Even though she has clinically measurable disease, she have no evidence of disease progression or new lesions Overall, she tolerated maintenance Zejula well even at reduced dose I recommend we continue treatment indefinitely for now I plan to repeat imaging study again in November

## 2022-11-05 NOTE — Assessment & Plan Note (Signed)
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

## 2022-11-05 NOTE — Assessment & Plan Note (Signed)
She has chronic lower back pain, stable on prescribed hydrocodone I refilled her prescription today

## 2022-11-05 NOTE — Progress Notes (Signed)
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[facility]) Her imaging study in May were reviewed which showed stable disease Even though she has clinically measurable disease, she have no evidence of disease progression or new lesions Overall, she tolerated maintenance Zejula well even at reduced dose I recommend we continue treatment indefinitely for now I plan to repeat imaging study again in November  Pancytopenia, acquired [facility]) 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 She is not symptomatic I recommend we continue on reduced dose of niraparib at 100 mg daily I will establish threshold for her to continue on treatment as long as her platelet count stays above 75,000  Low back pain She has chronic lower back pain, stable on prescribed hydrocodone I refilled her prescription today  Orders Placed This Encounter  Procedures   CT ABDOMEN PELVIS W CONTRAST    Standing Status:   Future    Standing Expiration Date:   11/05/2023    Order Specific Question:   If indicated for the ordered procedure, I authorize the administration of contrast media per Radiology protocol    Answer:   Yes    Order Specific Question:   Does the patient have a contrast media/X-ray dye allergy?    Answer:   No    Order Specific Question:   Preferred imaging location?    Answer:   [facility]    Order Specific Question:   If indicated for the ordered procedure, I authorize the administration of oral contrast media per Radiology protocol    Answer:   Yes    All questions were answered. The patient knows to call the clinic with any problems, questions or concerns. The total time spent in the appointment was 30 minutes encounter with patients including review of chart and various tests results,  discussions about plan of care and coordination of care plan   Artis Delay, MD 11/05/2022 10:56 AM  INTERVAL HISTORY: Please see below for problem oriented charting. she returns for treatment follow-up on niraparib She tolerated treatment well No recent infection She denies abdominal pain and changes in bowel habits No recent bleeding We discussed timing of her next imaging and future follow-up  REVIEW OF SYSTEMS:   Constitutional: Denies fevers, chills or abnormal weight loss Eyes: Denies blurriness of vision Ears, nose, mouth, throat, and face: Denies mucositis or sore throat Respiratory: Denies cough, dyspnea or wheezes Cardiovascular: Denies palpitation, chest discomfort or lower extremity swelling Gastrointestinal:  Denies nausea, heartburn or change in bowel habits Skin: Denies abnormal skin rashes Lymphatics: Denies new lymphadenopathy or easy bruising Neurological:Denies numbness, tingling or new weaknesses Behavioral/Psych: Mood is stable, no new changes  All other systems were reviewed with the patient and are negative.  I have reviewed the past medical history, past surgical history, social history and family history with the patient and they are unchanged from previous note.  ALLERGIES:  is allergic to carboplatin, shellfish allergy, vancomycin, benadryl [diphenhydramine hcl], penicillins, and tape.  MEDICATIONS:  Current Outpatient Medications  Medication Sig Dispense Refill   calcium elemental as carbonate (BARIATRIC TUMS ULTRA) 400 MG chewable tablet Chew 1 tablet by mouth 2 (two) times daily.     Cholecalciferol (VITAMIN D) 50 MCG (2000 UT) tablet Take 2,000 Units by mouth at bedtime.     gabapentin (NEURONTIN) 300 MG  capsule TAKE 3 CAPSULES BY MOUTH TWICE DAILY 120 capsule 11   HYDROcodone-acetaminophen (NORCO/VICODIN) 5-325 MG tablet Take 1 tablet by mouth every 6 (six) hours as needed for moderate pain. 60 tablet 0   insulin glargine, 1 Unit Dial, (TOUJEO  SOLOSTAR) 300 UNIT/ML Solostar Pen Inject 30 Units into the skin daily.     lidocaine-prilocaine (EMLA) cream Apply to Porta-cath  1-2 hours prior to access as directed. (Patient taking differently: Apply 1 application  topically daily as needed (port access).) 30 g 9   niraparib tosylate (ZEJULA) 100 MG tablet Take 1 tablet (100 mg total) by mouth daily. 30 tablet 9   ondansetron (ZOFRAN) 8 MG tablet TAKE 1 TABLET(8 MG) BY MOUTH EVERY 8 HOURS AS NEEDED 60 tablet 1   OZEMPIC, 0.25 OR 0.5 MG/DOSE, 2 MG/1.5ML SOPN Inject 0.25 mg into the skin every Saturday.     pantoprazole (PROTONIX) 40 MG tablet Take 1 tablet (40 mg total) by mouth daily.     polyethylene glycol (MIRALAX / GLYCOLAX) 17 g packet Take 17 g by mouth daily.     rotigotine (NEUPRO) 4 MG/24HR APPLY 1 PATCH TOPICALLY TO THE SKIN DAILY 90 patch 0   senna (SENOKOT) 8.6 MG tablet Take 2 tablets by mouth 2 (two) times daily.     No current facility-administered medications for this visit.    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 Had allergic reaction to carboplatin. Avastin was stopped due to GI hemorrhage   Primary peritoneal carcinomatosis (HCC)  09/06/2011 Imaging   CT findings consistent with cirrhotic changes involving the liver.  No worrisome liver mass.  There are associated portal venous collaterals and splenomegaly consistent with portal venous hypertension. 2.  No other significant upper abdominal findings   11/08/2013 Imaging   US showed ascites   11/08/2013 Initial Diagnosis   Patient had ~ 2 months of some urinary incontinence, saw Dr Billy Coast in 10-2013, with pelvic mass on exam    11/12/2013 Imaging   There are findings of progressive hepatic cirrhosis with a large amount of ascites and mild splenomegaly. There are venous collaterals present. 2. There is abnormal thickening of the peritoneal surface along the left  mid and lower abdominal wall. There is abnormal soft tissue density in the pelvis as well which could reflect the clinically suspected ovarian malignancy but the findings are nondiagnostic. 3. There is a new small left pleural effusion. 4. There is no acute bowel abnormality.   11/18/2013 Imaging   Successful ultrasound guided diagnostic and therapeutic paracentesis yielding 4.1 liters of ascites.     11/18/2013 Pathology Results   PERITONEAL/ASCITIC FLUID (SPECIMEN 1 OF 1 COLLECTED 11/18/13): SEROUS CARCINOMA, PLEASE SEE COMMENT.   12/01/2013 Tumor Marker   Patient's tumor was tested for the following markers: CA125 Results of the tumor marker test revealed 1938   12/03/2013 Imaging   Successful ultrasound-guided therapeutic paracentesis yielding 3.6 liters of peritoneal fluid.   12/07/2013 - 05/30/2014 Chemotherapy   She received 3 cycles of carboplatin and Taxol, interrupted cycle 4 for surgery.  She subsequently completed 3 more cycles of chemotherapy after surgery   12/20/2013 Imaging   Successful ultrasound-guided diagnostic and therapeutic paracentesis yielding 2.2 liters of peritoneal fluid   12/20/2013 Pathology Results   PERITONEAL/ASCITIC FLUID(SPECIMEN 1 OF 1 COLLECTED 12/20/13): MALIGNANT CELLS CONSISTENT WITH SEROUS CARCINOMA   01/13/2014 Tumor Marker   Patient's tumor was tested for the following markers: CA125  Results of the tumor marker test revealed 227   02/07/2014 Tumor Marker   Patient's tumor was tested for the following markers: CA125 Results of the tumor marker test revealed 130   02/15/2014 Genetic Testing   Genetics testing from 02-2014 normal ([employer] panel)   02/23/2014 Imaging   Interval decrease in ascites. 2. Morphologic changes in the liver consistent with cirrhosis. 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 Portal vein remains patent at this time. 3. Persistent but decreased abnormal soft tissue attenuation tracking in  the omental fat. This may be secondary to interval improvement in metastatic disease. 4. Peritoneal thickening seen in the left abdomen and pelvis on the previous study has decreased and nearly resolved in the interval. This also suggests interval improvement in metastatic disease.     03/07/2014 Procedure   Ultrasound and fluoroscopically guided right internal jugular single lumen power port catheter insertion. Tip in the SVC/RA junction. Catheter ready for use.   03/17/2014 Tumor Marker   Patient's tumor was tested for the following markers: CA125 Results of the tumor marker test revealed 45   03/29/2014 Pathology Results   1. Omentum, resection for tumor - HIGH GRADE SEROUS CARCINOMA, SEE COMMENT. 2. Ovary and fallopian tube, right - HIGH GRADE SEROUS CARCINOMA, SEE COMMENT. 3. Ovary and fallopian tube, left - HIGH GRADE SEROUS CARCINOMA, SEE COMMENT. Diagnosis Note 1. Nests and clusters of malignant cells are invading the omental tissue (part #1) with associated fibrosis. The cells are pleomorphic with prominent nucleoli. There are scattered psammoma bodies. The ovaries are atrophic and exhibit multiple foci of surface based invasive carcinoma and associated fibrosis. The fallopian tubes have a few foci of carcinoma, also superficially located. There are no precursor lesions noted in the ovaries or fallopian tubes (the entire tubes and ovaries were submitted for evaluation). While there is some retraction artifact, there are several foci suspicious for lymphovascular invasion. 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 Given the fibrosis, there does appear to be a small amount of treatment effect, however, there is abundant residual tumor.    03/29/2014 Surgery   Procedure(s) Performed: 1. Exploratory laparotomy with bilateral salpingo-oophorectomy, omentectomy  radical tumor debulking for ovarian cancer .   Surgeon: Luisa Dago, MD.   Assistant Surgeon: Antionette Char, M.D. Assistant: (an MD assistant was necessary for tissue manipulation, retraction and positioning due to the complexity of the case and hospital policies).  Operative Findings: 10cm omental cake from hepatic to splenic flexure, densely adherent to transverse colon. Milliary studding of tumor implants (<35mm, too numerous in number to count) adherent to the mesentery of the small bowel and small bowel wall. Small volume (100cc) ascites. Small ovaries bilaterally, densely adherent to the pelvic cul de sac. Left ovary and tube densely adherent to sigmoid colon. Cirrhotic liver with hepatomegaly and splenomegaly.     This represented an optimal cytoreduction (R1) with visible disease residual on the bowel wall and mesentery (millial, <74mm implants).      04/18/2014 Tumor Marker   Patient's tumor was tested for the following markers: CA125 Results of the tumor marker test revealed 122   05/16/2014 Tumor Marker   Patient's tumor was tested for the following markers: CA125 Results of the tumor marker test revealed 30   06/10/2014 Tumor Marker   Patient's tumor was tested for the following markers: CA125 Results of the tumor  marker test revealed 19   07/04/2014 Imaging   Interval improvement in the appearance of peritoneal metastasis secondary to ovarian cancer. 2. 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     08/18/2014 Tumor Marker   Patient's tumor was tested for the following markers: CA125 Results of the tumor marker test revealed 16   10/13/2014 Tumor Marker   Patient's tumor was tested for the following markers: CA125 Results of the tumor marker test revealed 14   11/21/2014 Tumor Marker   Patient's tumor was tested for the following markers: CA125 Results of the tumor marker test revealed 16   12/28/2014 Tumor Marker   Patient's tumor was tested for the  following markers: CA125 Results of the tumor marker test revealed 762   02/27/2015 Tumor Marker   Patient's tumor was tested for the following markers: CA125 Results of the tumor marker test revealed 20.2   03/22/2015 Imaging   Filler appearance to the prior exam, with very subtle fluid tracking along portions of the liver, and very minimal nodularity along the right paracolic gutter representing residua from the prior peritoneal cancer. The current abnormalities could simply be post therapy findings rather than necessarily representing residual malignancy. No new or enlarging lesions are identified. 2. Hepatic cirrhosis and splenomegaly. There is some gastric varices suggesting portal venous hypertension. 3. Left foraminal stenosis at L4-5 due to spurring. There is likely also some central narrowing of the thecal sac at this level. 4. Bibasilar scarring. 5. Chronic mild left mid kidney scarring   03/27/2015 Tumor Marker   Patient's tumor was tested for the following markers: CA125 Results of the tumor marker test revealed 25.3   04/24/2015 Imaging   Disc bulge L2-3 with mild spinal stenosis and right foraminal encroachment. 2. Disc bulge L3-4 with mild spinal stenosis. 3. Multifactorial spinal, left lateral recess and foraminal stenosis L4-5. There is grade 1 anterolisthesis without evident dynamic instability.   05/29/2015 Tumor Marker   Patient's tumor was tested for the following markers: CA125 Results of the tumor marker test revealed 29.9   08/21/2015 Tumor Marker   Patient's tumor was tested for the following markers: CA125 Results of the tumor marker test revealed 45   09/18/2015 Tumor Marker   Patient's tumor was tested for the following markers: CA125 Results of the tumor marker test revealed 47   09/27/2015 Imaging   New small bowel mesenteric nodule and enlarged left external iliac lymph node, highly worrisome for metastatic disease. 2. Cirrhosis and splenomegaly   10/10/2015 Tumor  Marker   Patient's tumor was tested for the following markers: CA125 Results of the tumor marker test revealed 53.4   10/10/2015 - 12/11/2015 Chemotherapy   She received 4 cycles of carboplatin only   11/20/2015 Tumor Marker   Patient's tumor was tested for the following markers: CA125 Results of the tumor marker test revealed 41.3   12/20/2015 Imaging   CT: Mild left external iliac lymphadenopathy is slightly decreased. Mildly enlarged lower mesenteric nodule is slightly decreased. 2. No new or progressive metastatic disease in the abdomen or pelvis. 3. Cirrhosis. Stable subcentimeter hypodense left liver lobe lesion. No new liver lesions. 4. Stable mild splenomegaly.  No ascites. 5. Mild sigmoid diverticulosis.   01/01/2016 -  Anti-estrogen oral therapy   She was placed on tamoxifen. Had letrozole initially but switched to tamoxifen due to poor tolerance   01/15/2016 Tumor Marker   Patient's tumor was tested for the following markers: CA125 Results of the tumor marker test  revealed 31.4   02/19/2016 Tumor Marker   Patient's tumor was tested for the following markers: CA125 Results of the tumor marker test revealed 36.5   05/13/2016 Imaging   No evidence of disease progression within the abdomen or pelvis. Previously noted small central mesenteric nodule and left external iliac node appear slightly smaller. 2. Stable changes of hepatic cirrhosis and portal hypertension with associated splenomegaly. No new or enlarging hepatic lesions are identified. 3. No acute findings.   05/13/2016 Tumor Marker   Patient's tumor was tested for the following markers: CA125 Results of the tumor marker test revealed 41.2   06/24/2016 Tumor Marker   Patient's tumor was tested for the following markers: CA125 Results of the tumor marker test revealed 47.3   08/20/2016 Imaging   1. The left external iliac lymph node is slightly increased in size compared to the prior exam, currently 1.1 cm and previously 0.9  cm in diameter. 2. Stable central mesenteric lymph node at 0.9 cm in diameter. 3. Stable trace free pelvic fluid and stable slight thickening along the right paracolic gutter, without well-defined peritoneal nodularity. 4. Other imaging findings of potential clinical significance: Subsegmental atelectasis or scarring in the lung bases. Hepatic cirrhosis. Left renal scarring. Mild splenomegaly. Sigmoid colon diverticulosis. Impingement at L4-5 due to spondylosis and degenerative disc disease broad Schmorl' s nodes at L3-L4. Pelvic floor laxity   08/23/2016 Tumor Marker   Patient's tumor was tested for the following markers: CA125 Results of the tumor marker test revealed 80.2   09/04/2016 Imaging   LV EF: 60% -   65%   09/12/2016 Tumor Marker   Patient's tumor was tested for the following markers: CA125 Results of the tumor marker test revealed 98.5   09/12/2016 - 01/29/2018 Chemotherapy   The patient received Doxil and Avastin. Avastin was discontinued in Dec 2019 due to GI hemorrhage   09/26/2016 Tumor Marker   Patient's tumor was tested for the following markers: CA125 Results of the tumor marker test revealed 68.9   10/10/2016 Tumor Marker   Patient's tumor was tested for the following markers: CA125 Results of the tumor marker test revealed 65.6   11/07/2016 Tumor Marker   Patient's tumor was tested for the following markers: CA125 Results of the tumor marker test revealed 46.4   11/29/2016 Imaging   Stable mild peritoneal thickening, suspicious for peritoneal carcinomatosis. No new or progressive disease identified. No evidence of ascites.  No significant change in 10 mm left external iliac and 8 mm mesenteric lymph nodes.  Stable hepatic cirrhosis, and splenomegaly consistent with portal venous hypertension.  Colonic diverticulosis, without radiographic evidence of diverticulitis.   12/02/2016 Imaging   Normal LV size with EF 55%. Basal inferior and basal inferoseptal  hypokinesis. Normal RV size and systolic function. No significant valvular abnormalities.   12/05/2016 Tumor Marker   Patient's tumor was tested for the following markers: CA125 Results of the tumor marker test revealed 49.1   01/09/2017 Tumor Marker   Patient's tumor was tested for the following markers: CA125 Results of the tumor marker test revealed 47.2   02/06/2017 Tumor Marker   Patient's tumor was tested for the following markers: CA125 Results of the tumor marker test revealed 44.1   02/18/2017 Imaging   Stable mild peritoneal thickening. No new or progressive disease identified within the abdomen or pelvis.  Stable tiny sub-cm left external iliac and mesenteric lymph nodes.  Stable hepatic cirrhosis, and splenomegaly consistent with portal venous hypertension. No evidence of hepatic  neoplasm.  Colonic diverticulosis, without radiographic evidence of diverticulitis.   03/06/2017 Tumor Marker   Patient's tumor was tested for the following markers: CA125 Results of the tumor marker test revealed 50.4   03/17/2017 Imaging   - Left ventricle: The cavity size was normal. Wall thickness was normal. Systolic function was normal. The estimated ejection fraction was in the range of 55% to 60%. Wall motion was normal; there were no regional wall motion abnormalities. Features are consistent with a pseudonormal left ventricular filling pattern, with concomitant abnormal relaxation and increased filling pressure (grade 2 diastolic dysfunction). - Mitral valve: There was mild regurgitation. - Left atrium: The atrium was mildly dilated   04/03/2017 Tumor Marker   Patient's tumor was tested for the following markers: CA125 Results of the tumor marker test revealed 47.2   05/14/2017 Imaging   CT scan of abdomen and pelvis Stable mild peritoneal thickening. No new or progressive disease within the abdomen or pelvis.  Stable hepatic cirrhosis. Stable splenomegaly, consistent with portal venous  hypertension. No evidence of hepatic neoplasm.   05/15/2017 Tumor Marker   Patient's tumor was tested for the following markers: CA125 Results of the tumor marker test revealed 56.1   06/12/2017 Tumor Marker   Patient's tumor was tested for the following markers: CA125 Results of the tumor marker test revealed 49.2   07/10/2017 Tumor Marker   Patient's tumor was tested for the following markers: CA125 Results of the tumor marker test revealed 46.3   08/07/2017 Tumor Marker   Patient's tumor was tested for the following markers: CA125 Results of the tumor marker test revealed 52.9   08/20/2017 Imaging   1. Mild omental/peritoneal haziness, unchanged. No evidence of new metastatic disease. 2. Cirrhosis with splenomegaly.   09/04/2017 Tumor Marker   Patient's tumor was tested for the following markers: CA125 Results of the tumor marker test revealed 48.5   11/13/2017 Tumor Marker   Patient's tumor was tested for the following markers: CA125 Results of the tumor marker test revealed 55.8   12/17/2017 Imaging   1. No findings to suggest metastatic disease in the abdomen or pelvis. 2. Severe hepatic cirrhosis with evidence of portal hypertension, as demonstrated by dilated portal vein, splenomegaly and portosystemic collateral pathways, including gastric and esophageal varices.  3. Colonic diverticulosis without evidence of acute diverticulitis at this time. 4. Additional incidental findings, as above.   01/16/2018 Tumor Marker   Patient's tumor was tested for the following markers: CA125 Results of the tumor marker test revealed 63.7   02/10/2018 Procedure   EGD - Recently bleeding grade III and large (> 5 mm) esophageal varices. Completely eradicated. Banded. - Portal hypertensive gastropathy. - Normal examined duodenum. - No specimens collected   02/13/2018 Tumor Marker   Patient's tumor was tested for the following markers: CA125 Results of the tumor marker test revealed 82.7    02/25/2018 Imaging   Bone density showed mild osteopenia   04/08/2018 Tumor Marker   Patient's tumor was tested for the following markers: CA125 Results of the tumor marker test revealed 82.7   04/08/2018 Imaging   1. No definite omental or peritoneal surface lesions. However, there are 2 slowly enlarging lymph nodes noted. One is in the small bowel mesentery and the other is in the left deep pelvis. Could not exclude recurrent disease. 2. Stable advanced cirrhotic changes involving the liver with portal venous hypertension, portal venous collaterals, esophageal varices and splenomegaly. No worrisome hepatic lesions. 3. Diffuse wall thickening of the colon  could suggest diffuse colitis or could be due to low albumin. Recommend correlation with any symptoms such as diarrhea.   05/21/2018 Tumor Marker   Patient's tumor was tested for the following markers: CA125 Results of the tumor marker test revealed 86   09/07/2018 Tumor Marker   Patient's tumor was tested for the following markers:CA-125 Results of the tumor marker test revealed 111   09/29/2018 Tumor Marker   Patient's tumor was tested for the following markers: CA-125 Results of the tumor marker test revealed 121.   09/29/2018 - 11/24/2018 Chemotherapy   The patient had carboplatin and gemzar for chemotherapy treatment.  Chemo was stopped due to allergic reaction to carboplatin   10/27/2018 Tumor Marker   Patient's tumor was tested for the following markers: CA-125 Results of the tumor marker test revealed 99.9   11/24/2018 Tumor Marker   Patient's tumor was tested for the following markers: CA-125 Results of the tumor marker test revealed 81.2   12/14/2018 Tumor Marker   Patient's tumor was tested for the following markers: CA-125 Results of the tumor marker test revealed 81.2   12/14/2018 Imaging   1. Mild improvement in peritoneal disease. Peritoneal nodule within the left posterior pelvis contiguous with the left side of  vaginal cuff is slightly decreased in size in the interval. Within the right iliac fossa there is a small peritoneal nodule which has decreased in size in the interval. Central small bowel mesenteric lymph node is not significantly changed. Slight decrease in size left periaortic lymph node.    2. Morphologic features of the liver compatible with cirrhosis. Splenomegaly is noted which may reflect portal venous hypertension.   12/21/2018 Tumor Marker   Patient's tumor was tested for the following markers: CA-125 Results of the tumor marker test revealed 64.2   12/21/2018 -  Chemotherapy   The patient had taxotere for chemotherapy treatment.     03/02/2019 Tumor Marker   Patient's tumor was tested for the following markers: CA-125. 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   No new or progressive metastatic disease within the abdomen or pelvis.   Hepatic cirrhosis and findings of portal venous hypertension. 1.1 cm low-attenuation lesion within left hepatic lobe, which could represent a regenerative nodule or small hepatocellular carcinoma. Recommend abdomen MRI without and with contrast for further characterization.     04/13/2019 Tumor Marker   Patient's tumor was tested for the following markers: CA-125 Results of the tumor marker test revealed 36.8   05/26/2019 Tumor Marker   Patient's tumor was tested for the following markers: CA-125 Results of the tumor marker test revealed 31.6.   06/15/2019 Tumor Marker   Patient's tumor was tested for the following markers: CA-125 Results of the tumor marker test revealed 25.4   07/05/2019 Imaging   1. Stable CT examination. Stable peritoneal thickening in the pelvis and paracolic gutters. Stable solid left deep pelvic implant. Stable trace ascites. No new or progressive metastatic disease. 2. Stable cirrhosis. No discrete liver masses. 3. Stable  mild-to-moderate splenomegaly. 4. Aortic Atherosclerosis (ICD10-I70.0).   07/27/2019 Tumor Marker   Patient's tumor was tested for the following markers: CA-125 Results of the tumor marker test revealed 35.4   08/17/2019 Tumor Marker   Patient's tumor was tested for the following markers: Ca-125 Results of the tumor marker test revealed 35.7.   09/17/2019 - 09/21/2019 Hospital Admission   She was admitted due to  esophageal variceal bleeding presents with hematemesis and melanotic stool with epigastric pain.  GI consulted, started on octreotide infusion.  Underwent EGD which showed minimal bleeding, remain on octreotide drip, PPI drip.  Hemoglobin trended down therefore received PRBC transfusion.  Over several days her bleeding subsided, hemodynamically remained stable.    09/17/2019 Procedure   EGD report - Esophageal mucosal scarring, from prior esophageal banding sessions. - Grade I esophageal varices. - Red blood in the cardia and in the gastric fundus. - Portal hypertensive gastropathy. - Normal duodenal bulb, first portion of the duodenum and second portion of the duodenum. 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 2. Otherwise stable CT exam. Stable peritoneal thickening within the bilateral deep pelvic peritoneum. Stable left deep pelvic implant.No evidence for progressed disease. 3. Stable splenomegaly. 4. Morphologic changes to the liver compatible with cirrhosis. Trace perihepatic and pelvic ascites.   02/22/2020 Imaging   1. New lucent lesion in the left side of the L4 vertebral body  measuring 1 cm, concerning for possible osseous metastasis. Consideration for further evaluation with bone scan is suggested. Close attention on follow-up imaging is also recommended to ensure stability. 2. Previously noted peritoneal thickening and nodularity in the low anatomic pelvis has regressed slightly. 3. Borderline enlarged and mildly enlarged pelvic and retroperitoneal lymph nodes appear similar to slightly increased compared to the prior examination, as above. Continued attention on follow-up studies is recommended.  4. 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 5. Aortic atherosclerosis. 6. Additional incidental findings, as above.   02/22/2020 Tumor Marker   Patient's tumor was tested for the following markers: CA-125 Results of the tumor marker test revealed 30.1   04/06/2020 Tumor Marker   Patient's tumor was tested for the following markers: CA-125 Results of the tumor marker test revealed 35.7   09/11/2020 Tumor Marker   Patient's tumor was tested for the following markers: CA-125. Results of the tumor marker test revealed 26.6.   09/12/2020 Imaging   1. Slow enlargement of a currently 2.9 cubic cm soft tissue nodule along the left adnexa. Tumor deposit not excluded. 2. The left external iliac lymph node has substantially reduced in size, currently 0.9 cm in short axis and formerly 1.4 cm. 3. 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 4. Cirrhosis with portal venous hypertension. 5. Mild distal esophageal wall thickening, probably from esophagitis. Splenomegaly. 6. Scarring in the left kidney. 7. Sigmoid colon diverticulosis. 8. Pelvic floor laxity with small cystocele. 9. Lumbar impingement at L2-3, L3-4, and L4-5.     10/23/2020 Tumor Marker   Patient's tumor was tested for the following  markers: CA-125. Results of the tumor marker test revealed 35.3.   12/12/2020 Tumor Marker   Patient's tumor was tested for the following markers: CA-125. Results of the tumor marker test revealed 39.1.   12/13/2020 Imaging   1. Continued increase in size of soft tissue nodule within the left posterior pelvis adjacent to the sigmoid colon. Cannot exclude small tumor deposit. 2. Similar appearance of thickening and mild nodularity along the peritoneal reflections  of the pelvis and pericolic gutters. 3. Morphologic findings of the liver compatible with cirrhosis. 4. Splenomegaly. 5. Aortic Atherosclerosis (ICD10-I70.0).   01/24/2021 Tumor Marker   Patient's tumor was tested for the following markers: CA-125. Results of the tumor marker test revealed 56.2.   02/09/2021 Imaging   1. Mild interval increased size of the solid left adnexal mass which may represent tumor recurrence/deposit. The mass abuts the adjacent sigmoid colon as well as the left posterior wall of the urinary bladder, invasion not excluded. 2. Otherwise no new mass or lymphadenopathy identified in the abdomen or pelvis. 3. Hepatic cirrhosis. Splenomegaly consistent with portal hypertension.   04/11/2021 Imaging   1. Left posterior pelvic soft tissue mass, adjacent to the sigmoid colon, has slightly increased in size in comparison to recent prior with significant interval increase in size over more remote priors, findings which are highly suspicious for active neoplastic disease involvement. 2. No new discrete omental or peritoneal nodules/masses identified. 3. Similar nodular thickening of the peritoneum along the bilateral pericolic gutters and in the pelvis without ascites. 4. No pathologically enlarged abdominal or pelvic lymph nodes and no solid organ metastases visualized. 5. Cirrhotic hepatic morphology with evidence of portal hypertension including splenomegaly. 6. Sigmoid colonic diverticulosis without findings of acute  diverticulitis. 7. Moderate volume of formed stool throughout the colon. 8. Aortic Atherosclerosis (ICD10-I70.0).   04/11/2021 Tumor Marker   Patient's tumor was tested for the following markers: CA-125. Results of the tumor marker test revealed 79.4 .   04/23/2021 - 10/09/2021 Chemotherapy   Patient is on Treatment Plan : Ovarian Docetaxel q21d     04/24/2021 Tumor Marker   Patient's tumor was tested for the following markers: CA-125. Results of the tumor marker test revealed 68.5.   05/16/2021 Tumor Marker   Patient's tumor was tested for the following markers: CA-125. Results of the tumor marker test revealed 68.1.   06/28/2021 Tumor Marker   Patient's tumor was tested for the following markers: CA-125. Results of the tumor marker test revealed 52.   07/16/2021 Imaging   1. Stable cirrhotic changes involving the liver. Associated portal venous hypertension, portal venous collaterals and splenomegaly. No hepatic lesions. 2. Slight interval decrease in size of the left pelvic implant. 3. Stable minimal peritoneal nodularity involving the pericolic gutters bilaterally. No new or progressive findings.     08/09/2021 Tumor Marker   Patient's tumor was tested for the following markers: CA-125. Results of the tumor marker test revealed 44.1.   09/20/2021 Tumor Marker   Patient's tumor was tested for the following markers: CA-125. Results of the tumor marker test revealed 38.2.   10/31/2021 Imaging   1. Stable exam. No new or progressive interval findings. 2. Stable appearance of the soft tissue nodule in the left pelvis. 3. Subtle omental nodule is similar to prior. The minimal peritoneal nodularity described previously in the para colic gutters is similar to prior. 4. Cirrhosis with splenomegaly. 5. Cholelithiasis. 6. Left colonic diverticulosis without diverticulitis. 7. Aortic Atherosclerosis (ICD10-I70.0).   10/31/2021 Tumor Marker   Patient's tumor was tested for the following  markers: Ca-125. Results of the tumor marker test revealed 37.7.   11/01/2021 - 11/01/2021 Chemotherapy   Patient is on Treatment Plan : BREAST Docetaxel (77) q21d     11/18/2021 -  Chemotherapy   She started on Niraparib   12/03/2021 Tumor Marker   Patient's tumor was tested for the following markers: CA-125. Results of the tumor marker test revealed 45.5.   12/27/2021  Tumor Marker   Patient's tumor was tested for the following markers: CA-125. Results of the tumor marker test revealed 48.3.   01/31/2022 Tumor Marker   Patient's tumor was tested for the following markers: CA-`125. Results of the tumor marker test revealed 43.6.   2022-02-28 Imaging   Stable soft tissue nodule in the low left hemipelvis in the surgical bed of the previous hysterectomy. Additional small areas of thickening along the abdomen without new ascites. There is 1 small asymmetric area of increased thickening along the posterolateral left hemipelvis as above. Attention to this area on short follow-up.   Nodular liver with fatty infiltration splenomegaly.   Gallstones.   Aortic atherosclerosis ICD10-I 70.0   04/01/2022 Tumor Marker   Patient's tumor was tested for the following markers: CA-125. Results of the tumor marker test revealed 49.7.   05/24/2022 Tumor Marker   Patient's tumor was tested for the following markers: CA-125. Results of the tumor marker test revealed 57.2.   07/11/2022 Imaging   CT ABDOMEN PELVIS W CONTRAST  Result Date: 07/10/2022 CLINICAL DATA:  Ovarian cancer, assess treatment response. Primary peritoneal carcinomatosis. Slowly rising CA 125 levels. * Tracking Code: BO * EXAM: CT ABDOMEN AND PELVIS WITH CONTRAST TECHNIQUE: Multidetector CT imaging of the abdomen and pelvis was performed using the standard protocol following bolus administration of intravenous contrast. RADIATION DOSE REDUCTION: This exam was performed according to the departmental dose-optimization program which  includes automated exposure control, adjustment of the mA and/or kV according to patient size and/or use of iterative reconstruction technique. CONTRAST:  OMNIPAQUE IOHEXOL 300 MG/ML  SOLN COMPARISON:  Multiple prior studies, most recently performed 28-Feb-2022 and 10/30/2021. FINDINGS: Lower chest: Linear atelectasis or scarring at both lung bases, similar to previous studies. No airspace disease or suspicious nodularity. No significant pleural or pericardial effusion. Bilateral breast implants are partially imaged. Mild distal esophageal wall thickening, unchanged. Hepatobiliary: The liver is normal in density without suspicious focal abnormality. There is diffuse contour irregularity of the liver consistent with cirrhosis. Unchanged punctate calcified gallstone. No gallbladder wall thickening, surrounding inflammation or significant biliary dilatation. Pancreas: Unremarkable. No pancreatic ductal dilatation or surrounding inflammatory changes. Spleen: Stable mild to moderate splenomegaly. The spleen measures up to 16.9 cm on sagittal image 83/6. No focal splenic abnormalities are identified. Adrenals/Urinary Tract: Both adrenal glands appear normal. No evidence of urinary tract calculus, suspicious renal lesion or hydronephrosis. Stable mild left renal cortical scarring and atrophy. The bladder appears unremarkable for its degree of distention. Stomach/Bowel: Enteric contrast has passed into the proximal colon. The stomach appears unremarkable for its degree of distension. No evidence of bowel wall thickening, distention or surrounding inflammatory change. Mildly prominent stool throughout the colon. Vascular/Lymphatic: There are no enlarged abdominal or pelvic lymph nodes. No acute vascular findings are demonstrated. There is mild aortic and branch vessel atherosclerosis without evidence of aneurysm or large vessel occlusion. Retroaortic left renal vein noted. Reproductive: Hysterectomy. Left pelvic soft  tissue nodule adjacent to the vaginal cuff and sigmoid colon is unchanged from the most recent prior studies, measuring 3.1 x 2.2 cm on image 73/2. As previously discussed, this has mildly enlarged compared with older prior studies in 2022. Other: Stable postsurgical changes in the anterior abdominal wall. Mesenteric soft tissue nodule adjacent to the small bowel measuring 1.4 x 1.0 cm on image 54/2 is unchanged. No progressive peritoneal nodularity or recurrent ascites identified. No evidence of pneumoperitoneum or focal extraluminal fluid collection. Musculoskeletal: No acute or significant osseous findings. Stable  lower lumbar spondylosis with chronic superior endplate Schmorl's nodes at L3 and L4. IMPRESSION: 1. Stable abdominopelvic CT without evidence of progressive peritoneal nodularity or recurrent ascites. 2. Stable soft tissue nodules in the left pelvis and small-bowel mesentery, likely stable residual peritoneal disease. No evidence of progressive metastatic disease. 3. Stable findings of cirrhosis and portal hypertension. 4. Cholelithiasis without evidence of cholecystitis or biliary dilatation. 5.  Aortic Atherosclerosis (ICD10-I70.0). Electronically Signed   By: Carey Bullocks M.D.   On: 07/10/2022 14:08      09/12/2022 Tumor Marker   Patient's tumor was tested for the following markers: CA-125. Results of the tumor marker test revealed 67.9.     PHYSICAL EXAMINATION: ECOG PERFORMANCE STATUS: 1 - Symptomatic but completely ambulatory  Vitals:   11/05/22 0834  BP: (!) 149/74  Pulse: 69  Resp: 18  Temp: 98.1 F (36.7 C)  SpO2: 99%   Filed Weights   11/05/22 0834  Weight: 178 lb 3.2 oz (80.8 kg)    GENERAL:alert, no distress and comfortable   LABORATORY DATA:  I have reviewed the data as listed    Component Value Date/Time   NA 141 11/05/2022 0811   NA 142 02/06/2017 0742   K 3.9 11/05/2022 0811   K 3.9 02/06/2017 0742   CL 108 11/05/2022 0811   CO2 27 11/05/2022 0811    CO2 22 02/06/2017 0742   GLUCOSE 101 (H) 11/05/2022 0811   GLUCOSE 156 (H) 02/06/2017 0742   BUN 15 11/05/2022 0811   BUN 13.5 02/06/2017 0742   CREATININE 0.91 11/05/2022 0811   CREATININE 0.93 03/28/2022 0737   CREATININE 0.8 02/06/2017 0742   CALCIUM 8.6 (L) 11/05/2022 0811   CALCIUM 8.5 02/06/2017 0742   PROT 7.3 11/05/2022 0811   PROT 6.9 02/06/2017 0742   ALBUMIN 4.1 11/05/2022 0811   ALBUMIN 3.7 02/06/2017 0742   AST 20 11/05/2022 0811   AST 25 03/28/2022 0737   AST 47 (H) 02/06/2017 0742   ALT 18 11/05/2022 0811   ALT 22 03/28/2022 0737   ALT 54 02/06/2017 0742   ALKPHOS 88 11/05/2022 0811   ALKPHOS 130 02/06/2017 0742   BILITOT 0.7 11/05/2022 0811   BILITOT 0.7 03/28/2022 0737   BILITOT 0.65 02/06/2017 0742   GFRNONAA >60 11/05/2022 0811   GFRNONAA >60 03/28/2022 0737   GFRAA >60 11/09/2019 0953   GFRAA >60 10/26/2019 0722    No results found for: "SPEP", "UPEP"  Lab Results  Component Value Date   WBC 3.9 (L) 11/05/2022   NEUTROABS 2.6 11/05/2022   HGB 12.6 11/05/2022   HCT 37.1 11/05/2022   MCV 95.1 11/05/2022   PLT 83 (L) 11/05/2022      Chemistry      Component Value Date/Time   NA 141 11/05/2022 0811   NA 142 02/06/2017 0742   K 3.9 11/05/2022 0811   K 3.9 02/06/2017 0742   CL 108 11/05/2022 0811   CO2 27 11/05/2022 0811   CO2 22 02/06/2017 0742   BUN 15 11/05/2022 0811   BUN 13.5 02/06/2017 0742   CREATININE 0.91 11/05/2022 0811   CREATININE 0.93 03/28/2022 0737   CREATININE 0.8 02/06/2017 0742      Component Value Date/Time   CALCIUM 8.6 (L) 11/05/2022 0811   CALCIUM 8.5 02/06/2017 0742   ALKPHOS 88 11/05/2022 0811   ALKPHOS 130 02/06/2017 0742   AST 20 11/05/2022 0811   AST 25 03/28/2022 0737   AST 47 (H) 02/06/2017 0742   ALT 18  11/05/2022 0811   ALT 22 03/28/2022 0737   ALT 54 02/06/2017 0742   BILITOT 0.7 11/05/2022 0811   BILITOT 0.7 03/28/2022 0737   BILITOT 0.65 02/06/2017 0742

## 2022-11-08 ENCOUNTER — Other Ambulatory Visit: Payer: Self-pay

## 2022-11-13 ENCOUNTER — Telehealth: Payer: Self-pay | Admitting: Pharmacy Technician

## 2022-11-13 ENCOUNTER — Encounter: Payer: Self-pay | Admitting: Hematology and Oncology

## 2022-11-13 ENCOUNTER — Other Ambulatory Visit (HOSPITAL_COMMUNITY): Payer: Self-pay

## 2022-11-13 NOTE — Telephone Encounter (Signed)
Oral Oncology Patient Advocate Encounter  Was successful in securing patient a $3,500 grant from [employer] to provide copayment coverage for Zejula.  This will keep the out of pocket expense at $0.     [employer] ID: [id number]  I have spoken with the patient.   The billing information is as follows and has been shared with [facility].    RxBin: [id number] PCN: [id number] Member ID: [id number] Group ID: [id number] Dates of Eligibility: 10/14/22 through 10/13/23  Fund:  Ovarian  Jinger Neighbors, CPhT-Adv Oncology Pharmacy Patient Advocate [facility] Direct Number: [phone]  Fax: [phone]

## 2022-11-26 NOTE — Progress Notes (Unsigned)
Assessment/Plan:   1.  Restless leg secondary to iron deficiency anemia  -Continue rotigotine patch, 4 mg daily.  -Patient following closely with Dr. Bertis Ruddy and her ferritin is normal. 2.  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  Pancytopenia  -Chemotherapy-induced.  Medications being reduced because of this. 4.  History of small subdural hematoma in November, 2020 after a fall  -Nothing further to do.  Has seen Dr. Wynetta Emery. 5.  Chronic LBP  -handicap sticker filled out, although equally disabled from CA 6.  Chemotherapy-induced neuropathy  -On gabapentin, 900 mg twice per day.  Being managed by Dr. Bertis Ruddy. Subjective:   Olivia Benson was seen today in follow up for RLS.  My previous records as well as any outside records available were reviewed prior to todays visit.  Patient is seen on a yearly basis.  She takes the rotigotine patch, 4 mg daily.  She continues to follow with Dr. Bertis Ruddy as well.  Her last appointment with her was September 24.  She has been doing well from an oncology standpoint.  Last ferritin was last checked about a year and a half ago and was 93.  PREVIOUS MEDICATIONS:  Neupro, 4mg   CURRENT MEDICATIONS:  Outpatient Encounter Medications as of 11/28/2022  Medication Sig   calcium elemental as carbonate (BARIATRIC TUMS ULTRA) 400 MG chewable tablet Chew 1 tablet by mouth 2 (two) times daily.   Cholecalciferol (VITAMIN D) 50 MCG (2000 UT) tablet Take 2,000 Units by mouth at bedtime.   gabapentin (NEURONTIN) 300 MG capsule TAKE 3 CAPSULES BY MOUTH TWICE DAILY   HYDROcodone-acetaminophen (NORCO/VICODIN) 5-325 MG tablet Take 1 tablet by mouth every 6 (six) hours as needed for moderate pain.   insulin glargine, 1 Unit Dial, (TOUJEO SOLOSTAR) 300 UNIT/ML Solostar Pen Inject 30 Units into the skin daily.   lidocaine-prilocaine (EMLA) cream Apply to Porta-cath  1-2 hours prior to access as directed. (Patient taking differently: Apply 1 application   topically daily as needed (port access).)   niraparib tosylate (ZEJULA) 100 MG tablet Take 1 tablet (100 mg total) by mouth daily.   ondansetron (ZOFRAN) 8 MG tablet TAKE 1 TABLET(8 MG) BY MOUTH EVERY 8 HOURS AS NEEDED   OZEMPIC, 0.25 OR 0.5 MG/DOSE, 2 MG/1.5ML SOPN Inject 0.25 mg into the skin every Saturday.   pantoprazole (PROTONIX) 40 MG tablet Take 1 tablet (40 mg total) by mouth daily.   polyethylene glycol (MIRALAX / GLYCOLAX) 17 g packet Take 17 g by mouth daily.   rotigotine (NEUPRO) 4 MG/24HR APPLY 1 PATCH TOPICALLY TO THE SKIN DAILY   senna (SENOKOT) 8.6 MG tablet Take 2 tablets by mouth 2 (two) times daily.   No facility-administered encounter medications on file as of 11/28/2022.     Objective:   PHYSICAL EXAMINATION:    VITALS:   There were no vitals filed for this visit.     GEN:  The patient appears stated age and is in NAD. HEENT:  Normocephalic, atraumatic.  The mucous membranes are moist. The superficial temporal arteries are without ropiness or tenderness. CV:  brady.  Regular Lungs:  CTAB Neck:  no bruits  Neurological examination:  Orientation: The patient is alert and oriented x3. Cranial nerves: There is good facial symmetry.The speech is fluent and clear. Soft palate rises symmetrically and there is no tongue deviation. Hearing is intact to conversational tone. Sensation: Sensation is intact to light touch throughout Motor: Strength is at least antigravity x4.  Movement examination: Tone:  There is normal tone in the UE/LE Abnormal movements:  no tremor.  No myoclonus.  No asterixis.   Coordination:  There is no decremation with RAM's. Gait and Station: The patient pushes off to arise.  She is ambulating well.  I have reviewed and interpreted the following labs independently   Chemistry      Component Value Date/Time   NA 141 11/05/2022 0811   NA 142 02/06/2017 0742   K 3.9 11/05/2022 0811   K 3.9 02/06/2017 0742   CL 108 11/05/2022 0811    CO2 27 11/05/2022 0811   CO2 22 02/06/2017 0742   BUN 15 11/05/2022 0811   BUN 13.5 02/06/2017 0742   CREATININE 0.91 11/05/2022 0811   CREATININE 0.93 03/28/2022 0737   CREATININE 0.8 02/06/2017 0742      Component Value Date/Time   CALCIUM 8.6 (L) 11/05/2022 0811   CALCIUM 8.5 02/06/2017 0742   ALKPHOS 88 11/05/2022 0811   ALKPHOS 130 02/06/2017 0742   AST 20 11/05/2022 0811   AST 25 03/28/2022 0737   AST 47 (H) 02/06/2017 0742   ALT 18 11/05/2022 0811   ALT 22 03/28/2022 0737   ALT 54 02/06/2017 0742   BILITOT 0.7 11/05/2022 0811   BILITOT 0.7 03/28/2022 0737   BILITOT 0.65 02/06/2017 0742     Lab Results  Component Value Date   WBC 3.9 (L) 11/05/2022   HGB 12.6 11/05/2022   HCT 37.1 11/05/2022   MCV 95.1 11/05/2022   PLT 83 (L) 11/05/2022   Lab Results  Component Value Date   FERRITIN 92 06/26/2021     Total time spent on today's visit was *** minutes, including both face-to-face time and nonface-to-face time.  Time included that spent on review of records (prior notes available to me/labs/imaging if pertinent), discussing treatment and goals, answering patient's questions and coordinating care.  Cc:  Stevphen Rochester, MD

## 2022-11-27 ENCOUNTER — Other Ambulatory Visit: Payer: Self-pay

## 2022-11-27 NOTE — Progress Notes (Signed)
Specialty Pharmacy Refill Coordination Note  Olivia Benson is a 77 y.o. female contacted today regarding refills of specialty medication(s) [name]   Patient requested Delivery   Delivery date: 12/03/22   Verified address: [address]   [name] [address]   Medication will be filled on 12/02/22.

## 2022-11-28 ENCOUNTER — Encounter: Payer: Self-pay | Admitting: Neurology

## 2022-11-28 ENCOUNTER — Ambulatory Visit (INDEPENDENT_AMBULATORY_CARE_PROVIDER_SITE_OTHER): Payer: Medicare Other | Admitting: Neurology

## 2022-11-28 ENCOUNTER — Other Ambulatory Visit (INDEPENDENT_AMBULATORY_CARE_PROVIDER_SITE_OTHER): Payer: Medicare Other

## 2022-11-28 VITALS — BP 122/82 | HR 62 | Ht 61.0 in | Wt 177.2 lb

## 2022-11-28 DIAGNOSIS — G2581 Restless legs syndrome: Secondary | ICD-10-CM | POA: Diagnosis not present

## 2022-11-28 DIAGNOSIS — D649 Anemia, unspecified: Secondary | ICD-10-CM | POA: Diagnosis not present

## 2022-11-28 MED ORDER — NEUPRO 4 MG/24HR TD PT24: MEDICATED_PATCH | TRANSDERMAL | 3 refills | Status: DC

## 2022-11-28 NOTE — Patient Instructions (Signed)
Your provider has requested that you have labwork completed today. The lab is located on the Second floor at [address], within the [facility] office. When you get off the elevator, turn right and go in the [facility] [address]; the first brown door on the left.  Tell the ladies behind the desk that you are there for lab work. If you are not called within 15 minutes please check with the front desk.   Once you complete your labs you are free to go. You will receive a call or message via MyChart with your lab results.

## 2022-11-29 LAB — IRON,TIBC AND FERRITIN PANEL
%SAT: 23 % (ref 16–45)
Ferritin: 39 ng/mL (ref 16–288)
Iron: 79 ug/dL (ref 45–160)
TIBC: 349 ug/dL (ref 250–450)

## 2022-12-12 ENCOUNTER — Other Ambulatory Visit: Payer: Self-pay | Admitting: Hematology and Oncology

## 2022-12-12 DIAGNOSIS — C482 Malignant neoplasm of peritoneum, unspecified: Secondary | ICD-10-CM

## 2022-12-12 DIAGNOSIS — Z7189 Other specified counseling: Secondary | ICD-10-CM

## 2022-12-20 ENCOUNTER — Other Ambulatory Visit: Payer: Self-pay

## 2022-12-20 NOTE — Progress Notes (Signed)
Specialty Pharmacy Refill Coordination Note  [name] is a 77 y.o. female contacted today regarding refills of specialty medication(s) [name]   Patient requested Delivery   Delivery date: 12/31/22   Verified address: [address]   [place] [address]   Medication will be filled on 12/30/22.

## 2022-12-24 ENCOUNTER — Inpatient Hospital Stay: Payer: Medicare Other | Attending: Hematology and Oncology

## 2022-12-24 ENCOUNTER — Ambulatory Visit (HOSPITAL_COMMUNITY)
Admission: RE | Admit: 2022-12-24 | Discharge: 2022-12-24 | Disposition: A | Discharge status: Home / Self Care | Payer: Medicare Other | Source: Ambulatory Visit | Attending: Hematology and Oncology | Admitting: Hematology and Oncology

## 2022-12-24 ENCOUNTER — Inpatient Hospital Stay: Payer: Medicare Other

## 2022-12-24 DIAGNOSIS — I700 Atherosclerosis of aorta: Secondary | ICD-10-CM | POA: Insufficient documentation

## 2022-12-24 DIAGNOSIS — Z79899 Other long term (current) drug therapy: Secondary | ICD-10-CM | POA: Insufficient documentation

## 2022-12-24 DIAGNOSIS — Z888 Allergy status to other drugs, medicaments and biological substances status: Secondary | ICD-10-CM | POA: Insufficient documentation

## 2022-12-24 DIAGNOSIS — Z880 Allergy status to penicillin: Secondary | ICD-10-CM | POA: Insufficient documentation

## 2022-12-24 DIAGNOSIS — C786 Secondary malignant neoplasm of retroperitoneum and peritoneum: Secondary | ICD-10-CM | POA: Insufficient documentation

## 2022-12-24 DIAGNOSIS — K766 Portal hypertension: Secondary | ICD-10-CM | POA: Insufficient documentation

## 2022-12-24 DIAGNOSIS — C482 Malignant neoplasm of peritoneum, unspecified: Secondary | ICD-10-CM

## 2022-12-24 DIAGNOSIS — K8020 Calculus of gallbladder without cholecystitis without obstruction: Secondary | ICD-10-CM | POA: Insufficient documentation

## 2022-12-24 DIAGNOSIS — Z5111 Encounter for antineoplastic chemotherapy: Secondary | ICD-10-CM

## 2022-12-24 DIAGNOSIS — M8580 Other specified disorders of bone density and structure, unspecified site: Secondary | ICD-10-CM | POA: Insufficient documentation

## 2022-12-24 DIAGNOSIS — Z881 Allergy status to other antibiotic agents status: Secondary | ICD-10-CM | POA: Insufficient documentation

## 2022-12-24 DIAGNOSIS — I429 Cardiomyopathy, unspecified: Secondary | ICD-10-CM | POA: Insufficient documentation

## 2022-12-24 DIAGNOSIS — C561 Malignant neoplasm of right ovary: Secondary | ICD-10-CM | POA: Insufficient documentation

## 2022-12-24 DIAGNOSIS — G8929 Other chronic pain: Secondary | ICD-10-CM | POA: Insufficient documentation

## 2022-12-24 DIAGNOSIS — D61818 Other pancytopenia: Secondary | ICD-10-CM | POA: Insufficient documentation

## 2022-12-24 DIAGNOSIS — M47816 Spondylosis without myelopathy or radiculopathy, lumbar region: Secondary | ICD-10-CM | POA: Insufficient documentation

## 2022-12-24 DIAGNOSIS — R180 Malignant ascites: Secondary | ICD-10-CM | POA: Insufficient documentation

## 2022-12-24 LAB — COMPREHENSIVE METABOLIC PANEL
ALT: 23 U/L (ref 0–44)
AST: 24 U/L (ref 15–41)
Albumin: 4.1 g/dL (ref 3.5–5.0)
Alkaline Phosphatase: 98 U/L (ref 38–126)
Anion gap: 6 (ref 5–15)
BUN: 16 mg/dL (ref 8–23)
CO2: 27 mmol/L (ref 22–32)
Calcium: 8.9 mg/dL (ref 8.9–10.3)
Chloride: 108 mmol/L (ref 98–111)
Creatinine, Ser: 0.93 mg/dL (ref 0.44–1.00)
GFR, Estimated: >60 mL/min (ref >60)
Glucose, Bld: 90 mg/dL (ref 70–99)
Potassium: 4.1 mmol/L (ref 3.5–5.1)
Sodium: 141 mmol/L (ref 135–145)
Total Bilirubin: 0.7 mg/dL (ref <1.2)
Total Protein: 7.2 g/dL (ref 6.5–8.1)

## 2022-12-24 LAB — CBC WITH DIFFERENTIAL/PLATELET
Abs Immature Granulocytes: 0.02 10*3/uL (ref 0.00–0.07)
Basophils Absolute: 0 10*3/uL (ref 0.0–0.1)
Basophils Relative: 0 %
Eosinophils Absolute: 0.1 10*3/uL (ref 0.0–0.5)
Eosinophils Relative: 2 %
HCT: 38 % (ref 36.0–46.0)
Hemoglobin: 12.6 g/dL (ref 12.0–15.0)
Immature Granulocytes: 1 %
Lymphocytes Relative: 25 %
Lymphs Abs: 1 10*3/uL (ref 0.7–4.0)
MCH: 31.8 pg (ref 26.0–34.0)
MCHC: 33.2 g/dL (ref 30.0–36.0)
MCV: 96 fL (ref 80.0–100.0)
Monocytes Absolute: 0.3 10*3/uL (ref 0.1–1.0)
Monocytes Relative: 7 %
Neutro Abs: 2.5 10*3/uL (ref 1.7–7.7)
Neutrophils Relative %: 65 %
Platelets: 87 10*3/uL — ABNORMAL LOW (ref 150–400)
RBC: 3.96 MIL/uL (ref 3.87–5.11)
RDW: 13.9 % (ref 11.5–15.5)
WBC: 3.9 10*3/uL — ABNORMAL LOW (ref 4.0–10.5)
nRBC: 0 % (ref 0.0–0.2)

## 2022-12-24 MED ORDER — HEPARIN SOD (PORK) LOCK FLUSH 100 UNIT/ML IV SOLN
INTRAVENOUS | Status: AC
Start: 2022-12-24 — End: ?
  Filled 2022-12-24: qty 5

## 2022-12-24 MED ORDER — IOHEXOL 300 MG/ML  SOLN
30.0000 mL | Freq: Once | INTRAMUSCULAR | Status: AC | PRN
  Administered 2022-12-24: 30 mL via ORAL

## 2022-12-24 MED ORDER — SODIUM CHLORIDE 0.9% FLUSH
10.0000 mL | Freq: Once | INTRAVENOUS | Status: AC
  Administered 2022-12-24: 10 mL

## 2022-12-24 MED ORDER — IOHEXOL 300 MG/ML  SOLN
75.0000 mL | Freq: Once | INTRAMUSCULAR | Status: AC | PRN
  Administered 2022-12-24: 75 mL via INTRAVENOUS

## 2022-12-24 MED ORDER — HEPARIN SOD (PORK) LOCK FLUSH 100 UNIT/ML IV SOLN
500.0000 [IU] | Freq: Once | INTRAVENOUS | Status: AC
  Administered 2022-12-24: 500 [IU] via INTRAVENOUS

## 2022-12-31 ENCOUNTER — Telehealth: Payer: Self-pay

## 2022-12-31 ENCOUNTER — Encounter: Payer: Self-pay | Admitting: Hematology and Oncology

## 2022-12-31 ENCOUNTER — Inpatient Hospital Stay (HOSPITAL_BASED_OUTPATIENT_CLINIC_OR_DEPARTMENT_OTHER): Payer: Medicare Other | Admitting: Hematology and Oncology

## 2022-12-31 VITALS — BP 148/64 | HR 71 | Resp 18 | Ht 61.0 in | Wt 177.0 lb

## 2022-12-31 DIAGNOSIS — C482 Malignant neoplasm of peritoneum, unspecified: Secondary | ICD-10-CM | POA: Diagnosis present

## 2022-12-31 DIAGNOSIS — Z880 Allergy status to penicillin: Secondary | ICD-10-CM | POA: Diagnosis not present

## 2022-12-31 DIAGNOSIS — M47816 Spondylosis without myelopathy or radiculopathy, lumbar region: Secondary | ICD-10-CM | POA: Diagnosis not present

## 2022-12-31 DIAGNOSIS — C786 Secondary malignant neoplasm of retroperitoneum and peritoneum: Secondary | ICD-10-CM | POA: Diagnosis not present

## 2022-12-31 DIAGNOSIS — Z888 Allergy status to other drugs, medicaments and biological substances status: Secondary | ICD-10-CM | POA: Diagnosis not present

## 2022-12-31 DIAGNOSIS — D61818 Other pancytopenia: Secondary | ICD-10-CM

## 2022-12-31 DIAGNOSIS — I429 Cardiomyopathy, unspecified: Secondary | ICD-10-CM | POA: Diagnosis not present

## 2022-12-31 DIAGNOSIS — I700 Atherosclerosis of aorta: Secondary | ICD-10-CM | POA: Diagnosis not present

## 2022-12-31 DIAGNOSIS — C561 Malignant neoplasm of right ovary: Secondary | ICD-10-CM | POA: Diagnosis not present

## 2022-12-31 DIAGNOSIS — R180 Malignant ascites: Secondary | ICD-10-CM | POA: Diagnosis not present

## 2022-12-31 DIAGNOSIS — M8580 Other specified disorders of bone density and structure, unspecified site: Secondary | ICD-10-CM | POA: Diagnosis not present

## 2022-12-31 DIAGNOSIS — G8929 Other chronic pain: Secondary | ICD-10-CM

## 2022-12-31 DIAGNOSIS — Z79899 Other long term (current) drug therapy: Secondary | ICD-10-CM | POA: Diagnosis not present

## 2022-12-31 DIAGNOSIS — K8020 Calculus of gallbladder without cholecystitis without obstruction: Secondary | ICD-10-CM | POA: Diagnosis not present

## 2022-12-31 DIAGNOSIS — Z881 Allergy status to other antibiotic agents status: Secondary | ICD-10-CM | POA: Diagnosis not present

## 2022-12-31 DIAGNOSIS — M549 Dorsalgia, unspecified: Secondary | ICD-10-CM

## 2022-12-31 DIAGNOSIS — K766 Portal hypertension: Secondary | ICD-10-CM | POA: Diagnosis not present

## 2022-12-31 MED ORDER — HYDROCODONE-ACETAMINOPHEN 5-325 MG PO TABS: 1.0000 | ORAL_TABLET | Freq: Four times a day (QID) | ORAL | 0 refills | Status: DC | PRN

## 2022-12-31 NOTE — Telephone Encounter (Signed)
Called and given below message. She verbalized understanding and appreciated the call.

## 2022-12-31 NOTE — Assessment & Plan Note (Signed)
She has chronic lower back pain, stable on prescribed hydrocodone I refilled her prescription today

## 2022-12-31 NOTE — Telephone Encounter (Signed)
-----   Message from Artis Delay sent at 12/31/2022  9:25 AM EST ----- Pls call her Ct result is out Minimal change I recommend observation as discussed

## 2022-12-31 NOTE — Assessment & Plan Note (Signed)
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 

## 2022-12-31 NOTE — Assessment & Plan Note (Signed)
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

## 2022-12-31 NOTE — Progress Notes (Signed)
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(HCC) At the time of reviewed with the patient, CT imaging results are not back I have personally reviewed CT images with the patient and subsequently call the patient with results Even though her tumor marker is slightly elevated, overall, she does not have signs of significant disease progression She is asymptomatic I recommend we continue current treatment with niraparib I plan to repeat imaging study again in 3 months, due around February 2025 She will return here again after the new year for repeat labs and port flushes  Pancytopenia, 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 The patient knows to call the clinic with any problems, questions or concerns. The total time spent in the appointment was 30 minutes encounter with patients including review of chart and various tests results, discussions about plan of care and coordination of care plan   Artis Delay, MD 12/31/2022 3:53 PM  INTERVAL HISTORY: Please see below for problem oriented charting. she returns for surveillance follow-up and review test results She is tolerating niraparib well I reviewed imaging study with  the patient as well as blood work We discussed future follow-up So far, she have no signs or symptoms of disease progression  REVIEW OF SYSTEMS:   Constitutional: Denies fevers, chills or abnormal weight loss Eyes: Denies blurriness of vision Ears, nose, mouth, throat, and face: Denies mucositis or sore throat Respiratory: Denies cough, dyspnea or wheezes Cardiovascular: Denies palpitation, chest discomfort or lower extremity swelling Gastrointestinal:  Denies nausea, heartburn or change in bowel habits Skin: Denies abnormal skin rashes Lymphatics: Denies new lymphadenopathy or easy bruising Neurological:Denies numbness, tingling or new weaknesses Behavioral/Psych: Mood is stable, no new changes  All other systems were reviewed with the patient and are negative.  I have reviewed the past medical history, past surgical history, social history and family history with the patient and they are unchanged from previous note.  ALLERGIES:  is allergic to carboplatin, shellfish allergy, vancomycin, benadryl [diphenhydramine hcl], penicillins, and tape.  MEDICATIONS:  Current Outpatient Medications  Medication Sig Dispense Refill   calcium elemental as carbonate (BARIATRIC TUMS ULTRA) 400 MG chewable tablet Chew 1 tablet by mouth 2 (two) times daily.     Cholecalciferol (VITAMIN D) 50 MCG (2000 UT) tablet Take 2,000 Units by mouth at bedtime.     gabapentin (NEURONTIN) 300 MG capsule TAKE 3 CAPSULES BY MOUTH TWICE DAILY 120 capsule 11   HYDROcodone-acetaminophen (NORCO/VICODIN) 5-325 MG tablet Take 1 tablet by mouth every 6 (six) hours as needed for moderate pain (pain score 4-6). 60 tablet 0   insulin glargine, 1 Unit Dial, (TOUJEO SOLOSTAR) 300 UNIT/ML Solostar Pen Inject 30 Units into the skin daily.  lidocaine-prilocaine (EMLA) cream Apply to Porta-cath  1-2 hours prior to access as directed. (Patient taking differently: Apply 1 application  topically daily as needed (port access).) 30 g 9    niraparib tosylate (ZEJULA) 100 MG tablet Take 1 tablet (100 mg total) by mouth daily. 30 tablet 9   ondansetron (ZOFRAN) 8 MG tablet TAKE 1 TABLET(8 MG) BY MOUTH EVERY 8 HOURS AS NEEDED 60 tablet 1   OZEMPIC, 0.25 OR 0.5 MG/DOSE, 2 MG/1.5ML SOPN Inject 0.25 mg into the skin every Saturday.     pantoprazole (PROTONIX) 40 MG tablet Take 1 tablet (40 mg total) by mouth daily.     polyethylene glycol (MIRALAX / GLYCOLAX) 17 g packet Take 17 g by mouth daily.     rotigotine (NEUPRO) 4 MG/24HR APPLY 1 PATCH TOPICALLY TO THE SKIN DAILY 90 patch 3   senna (SENOKOT) 8.6 MG tablet Take 2 tablets by mouth 2 (two) times daily.     No current facility-administered medications for this visit.    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 Had allergic reaction to carboplatin. Avastin was stopped due to GI hemorrhage   Primary peritoneal carcinomatosis (HCC)  09/06/2011 Imaging   CT findings consistent with cirrhotic changes involving the liver.  No worrisome liver mass.  There are associated portal venous collaterals and splenomegaly consistent with portal venous hypertension. 2.  No other significant upper abdominal findings   11/08/2013 Imaging   US showed ascites   11/08/2013 Initial Diagnosis   Patient had ~ 2 months of some urinary incontinence, saw Dr Billy Coast in 10-2013, with pelvic mass on exam    11/12/2013 Imaging   There are findings of progressive hepatic cirrhosis with a large amount of ascites and mild splenomegaly. There are venous collaterals present. 2. There is abnormal thickening of the peritoneal surface along the left mid and lower abdominal wall. There is abnormal soft tissue density in the pelvis as well which could reflect the clinically suspected ovarian malignancy but the findings are nondiagnostic. 3. There is a new small left pleural effusion. 4. There is no acute bowel  abnormality.   11/18/2013 Imaging   Successful ultrasound guided diagnostic and therapeutic paracentesis yielding 4.1 liters of ascites.     11/18/2013 Pathology Results   PERITONEAL/ASCITIC FLUID (SPECIMEN 1 OF 1 COLLECTED 11/18/13): SEROUS CARCINOMA, PLEASE SEE COMMENT.   12/01/2013 Tumor Marker   Patient's tumor was tested for the following markers: CA125 Results of the tumor marker test revealed 1938   12/03/2013 Imaging   Successful ultrasound-guided therapeutic paracentesis yielding 3.6 liters of peritoneal fluid.   12/07/2013 - 05/30/2014 Chemotherapy   She received 3 cycles of carboplatin and Taxol, interrupted cycle 4 for surgery.  She subsequently completed 3 more cycles of chemotherapy after surgery   12/20/2013 Imaging   Successful ultrasound-guided diagnostic and therapeutic paracentesis yielding 2.2 liters of peritoneal fluid   12/20/2013 Pathology Results   PERITONEAL/ASCITIC FLUID(SPECIMEN 1 OF 1 COLLECTED 12/20/13): MALIGNANT CELLS CONSISTENT WITH SEROUS CARCINOMA   01/13/2014 Tumor Marker   Patient's tumor was tested for the following markers: CA125 Results of the tumor marker test revealed 227   02/07/2014 Tumor Marker   Patient's tumor was tested for the following markers: CA125 Results of the tumor marker test revealed 130   02/15/2014 Genetic Testing   Genetics testing from 02-2014 normal ([employer] panel)   02/23/2014 Imaging   Interval decrease in ascites. 2. Morphologic changes  in the liver consistent with cirrhosis. 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 Portal vein remains patent at this time. 3. Persistent but decreased abnormal soft tissue attenuation tracking in the omental fat. This may be secondary to interval improvement in metastatic disease. 4. Peritoneal thickening seen in the left abdomen and pelvis on the previous study has decreased and nearly resolved in the interval. This also suggests interval improvement in  metastatic disease.     03/07/2014 Procedure   Ultrasound and fluoroscopically guided right internal jugular single lumen power port catheter insertion. Tip in the SVC/RA junction. Catheter ready for use.   03/17/2014 Tumor Marker   Patient's tumor was tested for the following markers: CA125 Results of the tumor marker test revealed 45   03/29/2014 Pathology Results   1. Omentum, resection for tumor - HIGH GRADE SEROUS CARCINOMA, SEE COMMENT. 2. Ovary and fallopian tube, right - HIGH GRADE SEROUS CARCINOMA, SEE COMMENT. 3. Ovary and fallopian tube, left - HIGH GRADE SEROUS CARCINOMA, SEE COMMENT. Diagnosis Note 1. Nests and clusters of malignant cells are invading the omental tissue (part #1) with associated fibrosis. The cells are pleomorphic with prominent nucleoli. There are scattered psammoma bodies. The ovaries are atrophic and exhibit multiple foci of surface based invasive carcinoma and associated fibrosis. The fallopian tubes have a few foci of carcinoma, also superficially located. There are no precursor lesions noted in the ovaries or fallopian tubes (the entire tubes and ovaries were submitted for evaluation). While there is some retraction artifact, there are several foci suspicious for lymphovascular invasion. 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 Given the fibrosis, there does appear to be a small amount of treatment effect, however, there is abundant residual tumor.    03/29/2014 Surgery   Procedure(s) Performed: 1. Exploratory laparotomy with bilateral salpingo-oophorectomy, omentectomy radical tumor debulking for ovarian cancer .   Surgeon: Luisa Dago, MD.   Assistant Surgeon: Antionette Char, M.D. Assistant: (an MD assistant was necessary for tissue manipulation, retraction and positioning due to the complexity of the case and hospital  policies).  Operative Findings: 10cm omental cake from hepatic to splenic flexure, densely adherent to transverse colon. Milliary studding of tumor implants (<58mm, too numerous in number to count) adherent to the mesentery of the small bowel and small bowel wall. Small volume (100cc) ascites. Small ovaries bilaterally, densely adherent to the pelvic cul de sac. Left ovary and tube densely adherent to sigmoid colon. Cirrhotic liver with hepatomegaly and splenomegaly.     This represented an optimal cytoreduction (R1) with visible disease residual on the bowel wall and mesentery (millial, <45mm implants).      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 2. 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     08/18/2014 Tumor Marker   Patient's tumor was tested for the following markers: CA125 Results of the tumor marker test revealed 16  10/13/2014 Tumor Marker   Patient's tumor was tested for the following markers: CA125 Results of the tumor marker test revealed 14   11/21/2014 Tumor Marker   Patient's tumor was tested for the following markers: CA125 Results of the tumor marker test revealed 16   12/28/2014 Tumor Marker   Patient's tumor was tested for the following markers: CA125 Results of the tumor marker test revealed 762   02/27/2015 Tumor Marker   Patient's tumor was tested for the following markers: CA125 Results of the tumor marker test revealed 20.2   03/22/2015 Imaging   Filler appearance to the prior  exam, with very subtle fluid tracking along portions of the liver, and very minimal nodularity along the right paracolic gutter representing residua from the prior peritoneal cancer. The current abnormalities could simply be post therapy findings rather than necessarily representing residual malignancy. No new or enlarging lesions are identified. 2. Hepatic cirrhosis and splenomegaly. There is some gastric varices suggesting portal venous hypertension. 3. Left foraminal stenosis at L4-5 due to spurring. There is likely also some central narrowing of the thecal sac at this level. 4. Bibasilar scarring. 5. Chronic mild left mid kidney scarring   03/27/2015 Tumor Marker   Patient's tumor was tested for the following markers: CA125 Results of the tumor marker test revealed 25.3   04/24/2015 Imaging   Disc bulge L2-3 with mild spinal stenosis and right foraminal encroachment. 2. Disc bulge L3-4 with mild spinal stenosis. 3. Multifactorial spinal, left lateral recess and foraminal stenosis L4-5. There is grade 1 anterolisthesis without evident dynamic instability.   05/29/2015 Tumor Marker   Patient's tumor was tested for the following markers: CA125 Results of the tumor marker test revealed 29.9   08/21/2015 Tumor Marker   Patient's tumor was tested for the following markers: CA125 Results of the tumor marker test revealed 45   09/18/2015 Tumor Marker   Patient's tumor was tested for the following markers: CA125 Results of the tumor marker test revealed 47   09/27/2015 Imaging   New small bowel mesenteric nodule and enlarged left external iliac lymph node, highly worrisome for metastatic disease. 2. Cirrhosis and splenomegaly   10/10/2015 Tumor Marker   Patient's tumor was tested for the following markers: CA125 Results of the tumor marker test revealed 53.4   10/10/2015 - 12/11/2015 Chemotherapy   She received 4 cycles of carboplatin only   11/20/2015 Tumor Marker   Patient's tumor was tested for  the following markers: CA125 Results of the tumor marker test revealed 41.3   12/20/2015 Imaging   CT: Mild left external iliac lymphadenopathy is slightly decreased. Mildly enlarged lower mesenteric nodule is slightly decreased. 2. No new or progressive metastatic disease in the abdomen or pelvis. 3. Cirrhosis. Stable subcentimeter hypodense left liver lobe lesion. No new liver lesions. 4. Stable mild splenomegaly.  No ascites. 5. Mild sigmoid diverticulosis.   01/01/2016 -  Anti-estrogen oral therapy   She was placed on tamoxifen. Had letrozole initially but switched to tamoxifen due to poor tolerance   01/15/2016 Tumor Marker   Patient's tumor was tested for the following markers: CA125 Results of the tumor marker test revealed 31.4   02/19/2016 Tumor Marker   Patient's tumor was tested for the following markers: CA125 Results of the tumor marker test revealed 36.5   05/13/2016 Imaging   No evidence of disease progression within the abdomen or pelvis. Previously noted small central mesenteric nodule and left external iliac node appear slightly smaller. 2. Stable changes of  hepatic cirrhosis and portal hypertension with associated splenomegaly. No new or enlarging hepatic lesions are identified. 3. No acute findings.   05/13/2016 Tumor Marker   Patient's tumor was tested for the following markers: CA125 Results of the tumor marker test revealed 41.2   06/24/2016 Tumor Marker   Patient's tumor was tested for the following markers: CA125 Results of the tumor marker test revealed 47.3   08/20/2016 Imaging   1. The left external iliac lymph node is slightly increased in size compared to the prior exam, currently 1.1 cm and previously 0.9 cm in diameter. 2. Stable central mesenteric lymph node at 0.9 cm in diameter. 3. Stable trace free pelvic fluid and stable slight thickening along the right paracolic gutter, without well-defined peritoneal nodularity. 4. Other imaging findings of potential  clinical significance: Subsegmental atelectasis or scarring in the lung bases. Hepatic cirrhosis. Left renal scarring. Mild splenomegaly. Sigmoid colon diverticulosis. Impingement at L4-5 due to spondylosis and degenerative disc disease broad Schmorl' s nodes at L3-L4. Pelvic floor laxity   08/23/2016 Tumor Marker   Patient's tumor was tested for the following markers: CA125 Results of the tumor marker test revealed 80.2   09/04/2016 Imaging   LV EF: 60% -   65%   09/12/2016 Tumor Marker   Patient's tumor was tested for the following markers: CA125 Results of the tumor marker test revealed 98.5   09/12/2016 - 01/29/2018 Chemotherapy   The patient received Doxil and Avastin. Avastin was discontinued in Dec 2019 due to GI hemorrhage   09/26/2016 Tumor Marker   Patient's tumor was tested for the following markers: CA125 Results of the tumor marker test revealed 68.9   10/10/2016 Tumor Marker   Patient's tumor was tested for the following markers: CA125 Results of the tumor marker test revealed 65.6   11/07/2016 Tumor Marker   Patient's tumor was tested for the following markers: CA125 Results of the tumor marker test revealed 46.4   11/29/2016 Imaging   Stable mild peritoneal thickening, suspicious for peritoneal carcinomatosis. No new or progressive disease identified. No evidence of ascites.  No significant change in 10 mm left external iliac and 8 mm mesenteric lymph nodes.  Stable hepatic cirrhosis, and splenomegaly consistent with portal venous hypertension.  Colonic diverticulosis, without radiographic evidence of diverticulitis.   12/02/2016 Imaging   Normal LV size with EF 55%. Basal inferior and basal inferoseptal hypokinesis. Normal RV size and systolic function. No significant valvular abnormalities.   12/05/2016 Tumor Marker   Patient's tumor was tested for the following markers: CA125 Results of the tumor marker test revealed 49.1   01/09/2017 Tumor Marker   Patient's  tumor was tested for the following markers: CA125 Results of the tumor marker test revealed 47.2   02/06/2017 Tumor Marker   Patient's tumor was tested for the following markers: CA125 Results of the tumor marker test revealed 44.1   02/18/2017 Imaging   Stable mild peritoneal thickening. No new or progressive disease identified within the abdomen or pelvis.  Stable tiny sub-cm left external iliac and mesenteric lymph nodes.  Stable hepatic cirrhosis, and splenomegaly consistent with portal venous hypertension. No evidence of hepatic neoplasm.  Colonic diverticulosis, without radiographic evidence of diverticulitis.   03/06/2017 Tumor Marker   Patient's tumor was tested for the following markers: CA125 Results of the tumor marker test revealed 50.4   03/17/2017 Imaging   - Left ventricle: The cavity size was normal. Wall thickness was normal. Systolic function was normal. The estimated ejection fraction was  in the range of 55% to 60%. Wall motion was normal; there were no regional wall motion abnormalities. Features are consistent with a pseudonormal left ventricular filling pattern, with concomitant abnormal relaxation and increased filling pressure (grade 2 diastolic dysfunction). - Mitral valve: There was mild regurgitation. - Left atrium: The atrium was mildly dilated   04/03/2017 Tumor Marker   Patient's tumor was tested for the following markers: CA125 Results of the tumor marker test revealed 47.2   05/14/2017 Imaging   CT scan of abdomen and pelvis Stable mild peritoneal thickening. No new or progressive disease within the abdomen or pelvis.  Stable hepatic cirrhosis. Stable splenomegaly, consistent with portal venous hypertension. No evidence of hepatic neoplasm.   05/15/2017 Tumor Marker   Patient's tumor was tested for the following markers: CA125 Results of the tumor marker test revealed 56.1   06/12/2017 Tumor Marker   Patient's tumor was tested for the following markers:  CA125 Results of the tumor marker test revealed 49.2   07/10/2017 Tumor Marker   Patient's tumor was tested for the following markers: CA125 Results of the tumor marker test revealed 46.3   08/07/2017 Tumor Marker   Patient's tumor was tested for the following markers: CA125 Results of the tumor marker test revealed 52.9   08/20/2017 Imaging   1. Mild omental/peritoneal haziness, unchanged. No evidence of new metastatic disease. 2. Cirrhosis with splenomegaly.   09/04/2017 Tumor Marker   Patient's tumor was tested for the following markers: CA125 Results of the tumor marker test revealed 48.5   11/13/2017 Tumor Marker   Patient's tumor was tested for the following markers: CA125 Results of the tumor marker test revealed 55.8   12/17/2017 Imaging   1. No findings to suggest metastatic disease in the abdomen or pelvis. 2. Severe hepatic cirrhosis with evidence of portal hypertension, as demonstrated by dilated portal vein, splenomegaly and portosystemic collateral pathways, including gastric and esophageal varices.  3. Colonic diverticulosis without evidence of acute diverticulitis at this time. 4. Additional incidental findings, as above.   01/16/2018 Tumor Marker   Patient's tumor was tested for the following markers: CA125 Results of the tumor marker test revealed 63.7   02/10/2018 Procedure   EGD - Recently bleeding grade III and large (> 5 mm) esophageal varices. Completely eradicated. Banded. - Portal hypertensive gastropathy. - Normal examined duodenum. - No specimens collected   02/13/2018 Tumor Marker   Patient's tumor was tested for the following markers: CA125 Results of the tumor marker test revealed 82.7   02/25/2018 Imaging   Bone density showed mild osteopenia   04/08/2018 Tumor Marker   Patient's tumor was tested for the following markers: CA125 Results of the tumor marker test revealed 82.7   04/08/2018 Imaging   1. No definite omental or peritoneal surface  lesions. However, there are 2 slowly enlarging lymph nodes noted. One is in the small bowel mesentery and the other is in the left deep pelvis. Could not exclude recurrent disease. 2. Stable advanced cirrhotic changes involving the liver with portal venous hypertension, portal venous collaterals, esophageal varices and splenomegaly. No worrisome hepatic lesions. 3. Diffuse wall thickening of the colon could suggest diffuse colitis or could be due to low albumin. Recommend correlation with any symptoms such as diarrhea.   05/21/2018 Tumor Marker   Patient's tumor was tested for the following markers: CA125 Results of the tumor marker test revealed 86   09/07/2018 Tumor Marker   Patient's tumor was tested for the following markers:CA-125 Results of  the tumor marker test revealed 111   09/29/2018 Tumor Marker   Patient's tumor was tested for the following markers: CA-125 Results of the tumor marker test revealed 121.   09/29/2018 - 11/24/2018 Chemotherapy   The patient had carboplatin and gemzar for chemotherapy treatment.  Chemo was stopped due to allergic reaction to carboplatin   10/27/2018 Tumor Marker   Patient's tumor was tested for the following markers: CA-125 Results of the tumor marker test revealed 99.9   11/24/2018 Tumor Marker   Patient's tumor was tested for the following markers: CA-125 Results of the tumor marker test revealed 81.2   12/14/2018 Tumor Marker   Patient's tumor was tested for the following markers: CA-125 Results of the tumor marker test revealed 81.2   12/14/2018 Imaging   1. Mild improvement in peritoneal disease. Peritoneal nodule within the left posterior pelvis contiguous with the left side of vaginal cuff is slightly decreased in size in the interval. Within the right iliac fossa there is a small peritoneal nodule which has decreased in size in the interval. Central small bowel mesenteric lymph node is not significantly changed. Slight decrease in size left  periaortic lymph node.    2. Morphologic features of the liver compatible with cirrhosis. Splenomegaly is noted which may reflect portal venous hypertension.   12/21/2018 Tumor Marker   Patient's tumor was tested for the following markers: CA-125 Results of the tumor marker test revealed 64.2   12/21/2018 -  Chemotherapy   The patient had taxotere for chemotherapy treatment.     03/02/2019 Tumor Marker   Patient's tumor was tested for the following markers: CA-125. 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   No new or progressive metastatic disease within the abdomen or pelvis.   Hepatic cirrhosis and findings of portal venous hypertension. 1.1 cm low-attenuation lesion within left hepatic lobe, which could represent a regenerative nodule or small hepatocellular carcinoma. Recommend abdomen MRI without and with contrast for further characterization.     04/13/2019 Tumor Marker   Patient's tumor was tested for the following markers: CA-125 Results of the tumor marker test revealed 36.8   05/26/2019 Tumor Marker   Patient's tumor was tested for the following markers: CA-125 Results of the tumor marker test revealed 31.6.   06/15/2019 Tumor Marker   Patient's tumor was tested for the following markers: CA-125 Results of the tumor marker test revealed 25.4   07/05/2019 Imaging   1. Stable CT examination. Stable peritoneal thickening in the pelvis and paracolic gutters. Stable solid left deep pelvic implant. Stable trace ascites. No new or progressive metastatic disease. 2. Stable cirrhosis. No discrete liver masses. 3. Stable mild-to-moderate splenomegaly. 4. Aortic Atherosclerosis (ICD10-I70.0).   07/27/2019 Tumor Marker   Patient's tumor was tested for the following markers: CA-125 Results of the tumor marker test revealed 35.4   08/17/2019 Tumor Marker   Patient's tumor was tested for  the following markers: Ca-125 Results of the tumor marker test revealed 35.7.   09/17/2019 - 09/21/2019 Hospital Admission   She was admitted due to esophageal variceal bleeding presents with hematemesis and melanotic stool with epigastric pain.  GI consulted, started on octreotide infusion.  Underwent EGD which showed minimal bleeding, remain on octreotide drip, PPI drip.  Hemoglobin trended down therefore received PRBC transfusion.  Over several days her bleeding subsided, hemodynamically remained stable.    09/17/2019 Procedure   EGD report -  Esophageal mucosal scarring, from prior esophageal banding sessions. - Grade I esophageal varices. - Red blood in the cardia and in the gastric fundus. - Portal hypertensive gastropathy. - Normal duodenal bulb, first portion of the duodenum and second portion of the duodenum. 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 2. Otherwise stable CT exam. Stable peritoneal thickening within the bilateral deep pelvic peritoneum. Stable left deep pelvic implant.No evidence for progressed disease. 3. Stable splenomegaly. 4. Morphologic changes to the liver compatible with cirrhosis. Trace perihepatic and pelvic ascites.   02/22/2020 Imaging   1. New lucent lesion in the left side of the L4 vertebral body measuring 1 cm, concerning for possible osseous metastasis. Consideration for further evaluation with bone scan is suggested. Close attention on follow-up imaging is also recommended to ensure stability. 2. Previously noted peritoneal thickening and nodularity in the low  anatomic pelvis has regressed slightly. 3. Borderline enlarged and mildly enlarged pelvic and retroperitoneal lymph nodes appear similar to slightly increased compared to the prior examination, as above. Continued attention on follow-up studies is recommended.  4. 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 5. Aortic atherosclerosis. 6. Additional incidental findings, as above.   02/22/2020 Tumor Marker   Patient's tumor was tested for the following markers: CA-125 Results of the tumor marker test revealed 30.1   04/06/2020 Tumor Marker   Patient's tumor was tested for the following markers: CA-125 Results of the tumor marker test revealed 35.7   09/11/2020 Tumor Marker   Patient's tumor was tested for the following markers: CA-125. Results of the tumor marker test revealed 26.6.   09/12/2020 Imaging   1. Slow enlargement of a currently 2.9 cubic cm soft tissue nodule along the left adnexa. Tumor deposit not excluded. 2. The left external iliac lymph node has substantially reduced in size, currently 0.9 cm in short axis and formerly 1.4 cm. 3. 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 4. Cirrhosis with portal venous hypertension. 5. Mild distal esophageal wall thickening, probably from esophagitis. Splenomegaly. 6. Scarring in the left kidney. 7. Sigmoid colon diverticulosis. 8. Pelvic floor laxity with small cystocele. 9. Lumbar impingement at L2-3, L3-4, and L4-5.     10/23/2020 Tumor Marker   Patient's tumor was tested for the following markers: CA-125. Results of the tumor marker test revealed 35.3.   12/12/2020 Tumor Marker   Patient's tumor was tested for the following markers: CA-125. Results of the tumor marker test revealed 39.1.   12/13/2020 Imaging   1. Continued increase in size of soft  tissue nodule within the left posterior pelvis adjacent to the sigmoid colon. Cannot exclude small tumor deposit. 2. Similar appearance of thickening and mild nodularity along the peritoneal reflections of the pelvis and pericolic gutters. 3. Morphologic findings of the liver compatible with cirrhosis. 4. Splenomegaly. 5. Aortic Atherosclerosis (ICD10-I70.0).   01/24/2021 Tumor Marker   Patient's tumor was tested for the following markers: CA-125. Results of the tumor marker test revealed 56.2.   02/09/2021 Imaging   1. Mild interval increased size of the solid left  adnexal mass which may represent tumor recurrence/deposit. The mass abuts the adjacent sigmoid colon as well as the left posterior wall of the urinary bladder, invasion not excluded. 2. Otherwise no new mass or lymphadenopathy identified in the abdomen or pelvis. 3. Hepatic cirrhosis. Splenomegaly consistent with portal hypertension.   04/11/2021 Imaging   1. Left posterior pelvic soft tissue mass, adjacent to the sigmoid colon, has slightly increased in size in comparison to recent prior with significant interval increase in size over more remote priors, findings which are highly suspicious for active neoplastic disease involvement. 2. No new discrete omental or peritoneal nodules/masses identified. 3. Similar nodular thickening of the peritoneum along the bilateral pericolic gutters and in the pelvis without ascites. 4. No pathologically enlarged abdominal or pelvic lymph nodes and no solid organ metastases visualized. 5. Cirrhotic hepatic morphology with evidence of portal hypertension including splenomegaly. 6. Sigmoid colonic diverticulosis without findings of acute diverticulitis. 7. Moderate volume of formed stool throughout the colon. 8. Aortic Atherosclerosis (ICD10-I70.0).   04/11/2021 Tumor Marker   Patient's tumor was tested for the following markers: CA-125. Results of the tumor marker test revealed 79.4 .    04/23/2021 - 10/09/2021 Chemotherapy   Patient is on Treatment Plan : Ovarian Docetaxel q21d     04/24/2021 Tumor Marker   Patient's tumor was tested for the following markers: CA-125. Results of the tumor marker test revealed 68.5.   05/16/2021 Tumor Marker   Patient's tumor was tested for the following markers: CA-125. Results of the tumor marker test revealed 68.1.   06/28/2021 Tumor Marker   Patient's tumor was tested for the following markers: CA-125. Results of the tumor marker test revealed 52.   07/16/2021 Imaging   1. Stable cirrhotic changes involving the liver. Associated portal venous hypertension, portal venous collaterals and splenomegaly. No hepatic lesions. 2. Slight interval decrease in size of the left pelvic implant. 3. Stable minimal peritoneal nodularity involving the pericolic gutters bilaterally. No new or progressive findings.     08/09/2021 Tumor Marker   Patient's tumor was tested for the following markers: CA-125. Results of the tumor marker test revealed 44.1.   09/20/2021 Tumor Marker   Patient's tumor was tested for the following markers: CA-125. Results of the tumor marker test revealed 38.2.   10/31/2021 Imaging   1. Stable exam. No new or progressive interval findings. 2. Stable appearance of the soft tissue nodule in the left pelvis. 3. Subtle omental nodule is similar to prior. The minimal peritoneal nodularity described previously in the para colic gutters is similar to prior. 4. Cirrhosis with splenomegaly. 5. Cholelithiasis. 6. Left colonic diverticulosis without diverticulitis. 7. Aortic Atherosclerosis (ICD10-I70.0).   10/31/2021 Tumor Marker   Patient's tumor was tested for the following markers: Ca-125. Results of the tumor marker test revealed 37.7.   11/01/2021 - 11/01/2021 Chemotherapy   Patient is on Treatment Plan : BREAST Docetaxel (77) q21d     11/18/2021 -  Chemotherapy   She started on Niraparib   12/03/2021 Tumor Marker    Patient's tumor was tested for the following markers: CA-125. Results of the tumor marker test revealed 45.5.   12/27/2021 Tumor Marker   Patient's tumor was tested for the following markers: CA-125. Results of the tumor marker test revealed 48.3.   01/31/2022 Tumor Marker   Patient's tumor was tested for the following markers: CA-`125. Results of the tumor marker test revealed 43.6.   02/26/2022 Imaging   Stable soft tissue nodule in the low left hemipelvis  in the surgical bed of the previous hysterectomy. Additional small areas of thickening along the abdomen without new ascites. There is 1 small asymmetric area of increased thickening along the posterolateral left hemipelvis as above. Attention to this area on short follow-up.   Nodular liver with fatty infiltration splenomegaly.   Gallstones.   Aortic atherosclerosis ICD10-I 70.0   04/01/2022 Tumor Marker   Patient's tumor was tested for the following markers: CA-125. Results of the tumor marker test revealed 49.7.   05/24/2022 Tumor Marker   Patient's tumor was tested for the following markers: CA-125. Results of the tumor marker test revealed 57.2.   07/11/2022 Imaging   CT ABDOMEN PELVIS W CONTRAST  Result Date: 07/10/2022 CLINICAL DATA:  Ovarian cancer, assess treatment response. Primary peritoneal carcinomatosis. Slowly rising CA 125 levels. * Tracking Code: BO * EXAM: CT ABDOMEN AND PELVIS WITH CONTRAST TECHNIQUE: Multidetector CT imaging of the abdomen and pelvis was performed using the standard protocol following bolus administration of intravenous contrast. RADIATION DOSE REDUCTION: This exam was performed according to the departmental dose-optimization program which includes automated exposure control, adjustment of the mA and/or kV according to patient size and/or use of iterative reconstruction technique. CONTRAST:  OMNIPAQUE IOHEXOL 300 MG/ML  SOLN COMPARISON:  Multiple prior studies, most recently performed  02/26/2022 and 10/30/2021. FINDINGS: Lower chest: Linear atelectasis or scarring at both lung bases, similar to previous studies. No airspace disease or suspicious nodularity. No significant pleural or pericardial effusion. Bilateral breast implants are partially imaged. Mild distal esophageal wall thickening, unchanged. Hepatobiliary: The liver is normal in density without suspicious focal abnormality. There is diffuse contour irregularity of the liver consistent with cirrhosis. Unchanged punctate calcified gallstone. No gallbladder wall thickening, surrounding inflammation or significant biliary dilatation. Pancreas: Unremarkable. No pancreatic ductal dilatation or surrounding inflammatory changes. Spleen: Stable mild to moderate splenomegaly. The spleen measures up to 16.9 cm on sagittal image 83/6. No focal splenic abnormalities are identified. Adrenals/Urinary Tract: Both adrenal glands appear normal. No evidence of urinary tract calculus, suspicious renal lesion or hydronephrosis. Stable mild left renal cortical scarring and atrophy. The bladder appears unremarkable for its degree of distention. Stomach/Bowel: Enteric contrast has passed into the proximal colon. The stomach appears unremarkable for its degree of distension. No evidence of bowel wall thickening, distention or surrounding inflammatory change. Mildly prominent stool throughout the colon. Vascular/Lymphatic: There are no enlarged abdominal or pelvic lymph nodes. No acute vascular findings are demonstrated. There is mild aortic and branch vessel atherosclerosis without evidence of aneurysm or large vessel occlusion. Retroaortic left renal vein noted. Reproductive: Hysterectomy. Left pelvic soft tissue nodule adjacent to the vaginal cuff and sigmoid colon is unchanged from the most recent prior studies, measuring 3.1 x 2.2 cm on image 73/2. As previously discussed, this has mildly enlarged compared with older prior studies in 2022. Other: Stable  postsurgical changes in the anterior abdominal wall. Mesenteric soft tissue nodule adjacent to the small bowel measuring 1.4 x 1.0 cm on image 54/2 is unchanged. No progressive peritoneal nodularity or recurrent ascites identified. No evidence of pneumoperitoneum or focal extraluminal fluid collection. Musculoskeletal: No acute or significant osseous findings. Stable lower lumbar spondylosis with chronic superior endplate Schmorl's nodes at L3 and L4. IMPRESSION: 1. Stable abdominopelvic CT without evidence of progressive peritoneal nodularity or recurrent ascites. 2. Stable soft tissue nodules in the left pelvis and small-bowel mesentery, likely stable residual peritoneal disease. No evidence of progressive metastatic disease. 3. Stable findings of cirrhosis and portal hypertension. 4. Cholelithiasis  without evidence of cholecystitis or biliary dilatation. 5.  Aortic Atherosclerosis (ICD10-I70.0). Electronically Signed   By: Carey Bullocks M.D.   On: 07/10/2022 14:08      09/12/2022 Tumor Marker   Patient's tumor was tested for the following markers: CA-125. Results of the tumor marker test revealed 67.9.   12/24/2022 Imaging   CT ABDOMEN PELVIS W CONTRAST  Result Date: 12/31/2022 CLINICAL DATA:  Ovarian cancer. Primary peritoneal carcinomatosis. Evaluate treatment response. * Tracking Code: BO * EXAM: CT ABDOMEN AND PELVIS WITH CONTRAST TECHNIQUE: Multidetector CT imaging of the abdomen and pelvis was performed using the standard protocol following bolus administration of intravenous contrast. RADIATION DOSE REDUCTION: This exam was performed according to the departmental dose-optimization program which includes automated exposure control, adjustment of the mA and/or kV according to patient size and/or use of iterative reconstruction technique. CONTRAST:  75mL OMNIPAQUE IOHEXOL 300 MG/ML  SOLN COMPARISON:  07/09/2022 FINDINGS: Lower chest: Bibasilar scarring. Normal heart size without pericardial or  pleural effusion. Bilateral breast implants. Hepatobiliary: Advanced cirrhosis as evidenced by an irregular hepatic capsule and caudate lobe enlargement. No suspicious liver lesion. Tiny gallstones are less apparent today. Pancreas: Normal, without mass or ductal dilatation. Spleen: Presumed splenule within the splenic hilum. Splenomegaly including at 14.1 cm craniocaudal. Adrenals/Urinary Tract: Normal adrenal glands. Normal kidneys, without hydronephrosis. Normal urinary bladder. Stomach/Bowel: Proximal stomach appears thick walled, but is underdistended. Abdominal wall laxity contains nonobstructive transverse colon on 49/2. Normal terminal ileum. Normal small bowel. Vascular/Lymphatic: Aortic atherosclerosis. Patent portal and splenic veins. Portal vein enlargement at 1.8 cm. No abdominal retroperitoneal adenopathy. Ileocolic mesenteric nodule or node measures 1.1 x 1.7 cm and 55/2 versus 1.1 x 1.2 cm when remeasured in a similar fashion on the prior. No pelvic sidewall adenopathy. Reproductive: Hysterectomy. Other: No significant free fluid. A left pelvic implant measures 3.2 x 2.9 cm on 74/2 versus 3.0 x 2.6 cm when remeasured in a similar fashion on the prior. Musculoskeletal: Osteopenia. Superior endplate compression deformity at L3 is mild. IMPRESSION: 1. 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 2. No new sites of disease. 3. Cirrhosis and portal venous hypertension/splenomegaly. 4. Cholelithiasis 5.  Aortic Atherosclerosis (ICD10-I70.0). Electronically Signed   By: Jeronimo Greaves M.D.   On: 12/31/2022 09:20        PHYSICAL EXAMINATION: ECOG PERFORMANCE STATUS: 0 - Asymptomatic  Vitals:   12/31/22 0834  BP: (!) 148/64  Pulse: 71  Resp: 18  SpO2: 98%   Filed Weights   12/31/22 0834  Weight: 177 lb (80.3 kg)    GENERAL:alert, no distress and comfortable  LABORATORY DATA:  I have reviewed the data as listed    Component Value Date/Time   NA 141  12/24/2022 0741   NA 142 02/06/2017 0742   K 4.1 12/24/2022 0741   K 3.9 02/06/2017 0742   CL 108 12/24/2022 0741   CO2 27 12/24/2022 0741   CO2 22 02/06/2017 0742   GLUCOSE 90 12/24/2022 0741   GLUCOSE 156 (H) 02/06/2017 0742   BUN 16 12/24/2022 0741   BUN 13.5 02/06/2017 0742   CREATININE 0.93 12/24/2022 0741   CREATININE 0.93 03/28/2022 0737   CREATININE 0.8 02/06/2017 0742   CALCIUM 8.9 12/24/2022 0741   CALCIUM 8.5 02/06/2017 0742   PROT 7.2 12/24/2022 0741   PROT 6.9 02/06/2017 0742   ALBUMIN 4.1 12/24/2022 0741   ALBUMIN 3.7 02/06/2017 0742   AST 24 12/24/2022 0741   AST 25 03/28/2022 0737  AST 47 (H) 02/06/2017 0742   ALT 23 12/24/2022 0741   ALT 22 03/28/2022 0737   ALT 54 02/06/2017 0742   ALKPHOS 98 12/24/2022 0741   ALKPHOS 130 02/06/2017 0742   BILITOT 0.7 12/24/2022 0741   BILITOT 0.7 03/28/2022 0737   BILITOT 0.65 02/06/2017 0742   GFRNONAA >60 12/24/2022 0741   GFRNONAA >60 03/28/2022 0737   GFRAA >60 11/09/2019 0953   GFRAA >60 10/26/2019 0722    No results found for: "SPEP", "UPEP"  Lab Results  Component Value Date   WBC 3.9 (L) 12/24/2022   NEUTROABS 2.5 12/24/2022   HGB 12.6 12/24/2022   HCT 38.0 12/24/2022   MCV 96.0 12/24/2022   PLT 87 (L) 12/24/2022      Chemistry      Component Value Date/Time   NA 141 12/24/2022 0741   NA 142 02/06/2017 0742   K 4.1 12/24/2022 0741   K 3.9 02/06/2017 0742   CL 108 12/24/2022 0741   CO2 27 12/24/2022 0741   CO2 22 02/06/2017 0742   BUN 16 12/24/2022 0741   BUN 13.5 02/06/2017 0742   CREATININE 0.93 12/24/2022 0741   CREATININE 0.93 03/28/2022 0737   CREATININE 0.8 02/06/2017 0742      Component Value Date/Time   CALCIUM 8.9 12/24/2022 0741   CALCIUM 8.5 02/06/2017 0742   ALKPHOS 98 12/24/2022 0741   ALKPHOS 130 02/06/2017 0742   AST 24 12/24/2022 0741   AST 25 03/28/2022 0737   AST 47 (H) 02/06/2017 0742   ALT 23 12/24/2022 0741   ALT 22 03/28/2022 0737   ALT 54 02/06/2017 0742    BILITOT 0.7 12/24/2022 0741   BILITOT 0.7 03/28/2022 0737   BILITOT 0.65 02/06/2017 0742       RADIOGRAPHIC STUDIES: I have reviewed imaging study with the patient I have personally reviewed the radiological images as listed and agreed with the findings in the report. CT ABDOMEN PELVIS W CONTRAST  Result Date: 12/31/2022 CLINICAL DATA:  Ovarian cancer. Primary peritoneal carcinomatosis. Evaluate treatment response. * Tracking Code: BO * EXAM: CT ABDOMEN AND PELVIS WITH CONTRAST TECHNIQUE: Multidetector CT imaging of the abdomen and pelvis was performed using the standard protocol following bolus administration of intravenous contrast. RADIATION DOSE REDUCTION: This exam was performed according to the departmental dose-optimization program which includes automated exposure control, adjustment of the mA and/or kV according to patient size and/or use of iterative reconstruction technique. CONTRAST:  75mL OMNIPAQUE IOHEXOL 300 MG/ML  SOLN COMPARISON:  07/09/2022 FINDINGS: Lower chest: Bibasilar scarring. Normal heart size without pericardial or pleural effusion. Bilateral breast implants. Hepatobiliary: Advanced cirrhosis as evidenced by an irregular hepatic capsule and caudate lobe enlargement. No suspicious liver lesion. Tiny gallstones are less apparent today. Pancreas: Normal, without mass or ductal dilatation. Spleen: Presumed splenule within the splenic hilum. Splenomegaly including at 14.1 cm craniocaudal. Adrenals/Urinary Tract: Normal adrenal glands. Normal kidneys, without hydronephrosis. Normal urinary bladder. Stomach/Bowel: Proximal stomach appears thick walled, but is underdistended. Abdominal wall laxity contains nonobstructive transverse colon on 49/2. Normal terminal ileum. Normal small bowel. Vascular/Lymphatic: Aortic atherosclerosis. Patent portal and splenic veins. Portal vein enlargement at 1.8 cm. No abdominal retroperitoneal adenopathy. Ileocolic mesenteric nodule or node measures 1.1  x 1.7 cm and 55/2 versus 1.1 x 1.2 cm when remeasured in a similar fashion on the prior. No pelvic sidewall adenopathy. Reproductive: Hysterectomy. Other: No significant free fluid. A left pelvic implant measures 3.2 x 2.9 cm on 74/2 versus 3.0 x 2.6 cm when  remeasured in a similar fashion on the prior. Musculoskeletal: Osteopenia. Superior endplate compression deformity at L3 is mild. IMPRESSION: 1. 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 2. No new sites of disease. 3. Cirrhosis and portal venous hypertension/splenomegaly. 4. Cholelithiasis 5.  Aortic Atherosclerosis (ICD10-I70.0). Electronically Signed   By: Jeronimo Greaves M.D.   On: 12/31/2022 09:20

## 2023-01-20 ENCOUNTER — Other Ambulatory Visit: Payer: Self-pay | Admitting: Hematology and Oncology

## 2023-01-20 DIAGNOSIS — Z7189 Other specified counseling: Secondary | ICD-10-CM

## 2023-01-20 DIAGNOSIS — C482 Malignant neoplasm of peritoneum, unspecified: Secondary | ICD-10-CM

## 2023-01-22 ENCOUNTER — Other Ambulatory Visit: Payer: Self-pay

## 2023-01-22 NOTE — Progress Notes (Signed)
Specialty Pharmacy Refill Coordination Note  Olivia Benson is a 77 y.o. female contacted today regarding refills of specialty medication(s) [name]   Patient requested Delivery   Delivery date: 01/29/23   Verified address: [address]   [place] [id number]   Medication will be filled on 01/28/23.

## 2023-01-22 NOTE — Progress Notes (Signed)
Specialty Pharmacy Ongoing Clinical Assessment Note  Olivia Benson is a 77 y.o. female who is being followed by the specialty pharmacy service for RxSp Oncology   Patient's specialty medication(s) reviewed today: Niraparib Tosylate   Missed doses in the last 4 weeks: 0   Patient/Caregiver did not have any additional questions or concerns.   Therapeutic benefit summary: Patient is achieving benefit   Adverse events/side effects summary: No adverse events/side effects   Patient's therapy is appropriate to: Continue    Goals Addressed             This Visit's Progress    Slow Disease Progression       Patient is on track. Patient will maintain adherence. Per provider notes from 12/31/22 patient CT showed no sign of significant disease progression.          Follow up:  6 months  Otto Herb Specialty Pharmacist

## 2023-01-28 ENCOUNTER — Other Ambulatory Visit: Payer: Self-pay

## 2023-02-18 ENCOUNTER — Inpatient Hospital Stay: Payer: Medicare Other | Attending: Hematology and Oncology

## 2023-02-18 ENCOUNTER — Other Ambulatory Visit (HOSPITAL_COMMUNITY): Payer: Self-pay

## 2023-02-18 ENCOUNTER — Other Ambulatory Visit (HOSPITAL_COMMUNITY): Payer: Self-pay | Admitting: Pharmacy Technician

## 2023-02-18 ENCOUNTER — Inpatient Hospital Stay (HOSPITAL_BASED_OUTPATIENT_CLINIC_OR_DEPARTMENT_OTHER): Payer: Medicare Other | Admitting: Hematology and Oncology

## 2023-02-18 ENCOUNTER — Encounter: Payer: Self-pay | Admitting: Hematology and Oncology

## 2023-02-18 VITALS — BP 141/76 | HR 72 | Temp 97.4°F | Resp 18 | Ht 61.0 in | Wt 174.6 lb

## 2023-02-18 DIAGNOSIS — C482 Malignant neoplasm of peritoneum, unspecified: Secondary | ICD-10-CM | POA: Insufficient documentation

## 2023-02-18 DIAGNOSIS — M8580 Other specified disorders of bone density and structure, unspecified site: Secondary | ICD-10-CM | POA: Insufficient documentation

## 2023-02-18 DIAGNOSIS — K7460 Unspecified cirrhosis of liver: Secondary | ICD-10-CM | POA: Diagnosis not present

## 2023-02-18 DIAGNOSIS — K8020 Calculus of gallbladder without cholecystitis without obstruction: Secondary | ICD-10-CM | POA: Insufficient documentation

## 2023-02-18 DIAGNOSIS — Z79899 Other long term (current) drug therapy: Secondary | ICD-10-CM | POA: Diagnosis not present

## 2023-02-18 DIAGNOSIS — D61818 Other pancytopenia: Secondary | ICD-10-CM | POA: Diagnosis not present

## 2023-02-18 DIAGNOSIS — K766 Portal hypertension: Secondary | ICD-10-CM | POA: Diagnosis not present

## 2023-02-18 DIAGNOSIS — K769 Liver disease, unspecified: Secondary | ICD-10-CM | POA: Diagnosis not present

## 2023-02-18 DIAGNOSIS — J90 Pleural effusion, not elsewhere classified: Secondary | ICD-10-CM | POA: Diagnosis not present

## 2023-02-18 DIAGNOSIS — I700 Atherosclerosis of aorta: Secondary | ICD-10-CM | POA: Diagnosis not present

## 2023-02-18 DIAGNOSIS — R161 Splenomegaly, not elsewhere classified: Secondary | ICD-10-CM | POA: Diagnosis not present

## 2023-02-18 DIAGNOSIS — Z5111 Encounter for antineoplastic chemotherapy: Secondary | ICD-10-CM

## 2023-02-18 DIAGNOSIS — R180 Malignant ascites: Secondary | ICD-10-CM | POA: Insufficient documentation

## 2023-02-18 LAB — COMPREHENSIVE METABOLIC PANEL
ALT: 21 U/L (ref 0–44)
AST: 23 U/L (ref 15–41)
Albumin: 4.2 g/dL (ref 3.5–5.0)
Alkaline Phosphatase: 90 U/L (ref 38–126)
Anion gap: 8 (ref 5–15)
BUN: 17 mg/dL (ref 8–23)
CO2: 25 mmol/L (ref 22–32)
Calcium: 9.1 mg/dL (ref 8.9–10.3)
Chloride: 109 mmol/L (ref 98–111)
Creatinine, Ser: 0.92 mg/dL (ref 0.44–1.00)
GFR, Estimated: >60 mL/min (ref >60)
Glucose, Bld: 113 mg/dL — ABNORMAL HIGH (ref 70–99)
Potassium: 3.8 mmol/L (ref 3.5–5.1)
Sodium: 142 mmol/L (ref 135–145)
Total Bilirubin: 0.8 mg/dL (ref 0.0–1.2)
Total Protein: 7.7 g/dL (ref 6.5–8.1)

## 2023-02-18 LAB — CBC WITH DIFFERENTIAL/PLATELET
Abs Immature Granulocytes: 0.01 10*3/uL (ref 0.00–0.07)
Basophils Absolute: 0 10*3/uL (ref 0.0–0.1)
Basophils Relative: 1 %
Eosinophils Absolute: 0.1 10*3/uL (ref 0.0–0.5)
Eosinophils Relative: 2 %
HCT: 37.5 % (ref 36.0–46.0)
Hemoglobin: 12.8 g/dL (ref 12.0–15.0)
Immature Granulocytes: 0 %
Lymphocytes Relative: 23 %
Lymphs Abs: 0.9 10*3/uL (ref 0.7–4.0)
MCH: 32.4 pg (ref 26.0–34.0)
MCHC: 34.1 g/dL (ref 30.0–36.0)
MCV: 94.9 fL (ref 80.0–100.0)
Monocytes Absolute: 0.2 10*3/uL (ref 0.1–1.0)
Monocytes Relative: 6 %
Neutro Abs: 2.6 10*3/uL (ref 1.7–7.7)
Neutrophils Relative %: 68 %
Platelets: 103 10*3/uL — ABNORMAL LOW (ref 150–400)
RBC: 3.95 MIL/uL (ref 3.87–5.11)
RDW: 13.6 % (ref 11.5–15.5)
WBC: 3.9 10*3/uL — ABNORMAL LOW (ref 4.0–10.5)
nRBC: 0 % (ref 0.0–0.2)

## 2023-02-18 MED ORDER — SODIUM CHLORIDE 0.9% FLUSH
10.0000 mL | Freq: Once | INTRAVENOUS | Status: AC
  Administered 2023-02-18: 10 mL

## 2023-02-18 MED ORDER — HEPARIN SOD (PORK) LOCK FLUSH 100 UNIT/ML IV SOLN
500.0000 [IU] | Freq: Once | INTRAVENOUS | Status: AC
  Administered 2023-02-18: 500 [IU]

## 2023-02-18 MED ORDER — HYDROCODONE-ACETAMINOPHEN 5-325 MG PO TABS: 1.0000 | ORAL_TABLET | Freq: Four times a day (QID) | ORAL | 0 refills | Status: DC | PRN

## 2023-02-18 NOTE — Assessment & Plan Note (Signed)
 Last CT imaging showed no evidence of disease progression, overall stable disease control I recommend we continue current treatment with niraparib  I plan to repeat imaging study again in 6 months, due around May 2025 I will see her again in 2 months for further follow-up

## 2023-02-18 NOTE — Progress Notes (Signed)
 Specialty Pharmacy Refill Coordination Note  Olivia Benson is a 78 y.o. female contacted today regarding refills of specialty medication(s) Niraparib  Tosylate (ZEJULA )   Patient requested Delivery   Delivery date: 02/28/23   Verified address: [address]  [facility] [place]   Medication will be filled on 02/27/23.

## 2023-02-18 NOTE — Progress Notes (Signed)
 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   All questions were answered. The patient knows to call the clinic with any problems, questions or concerns. The total time spent in the appointment was 20 minutes encounter with patients including review of chart and various tests results, discussions about plan of care and coordination of care plan   [name], MD 02/18/2023 10:42 AM  INTERVAL HISTORY: Please see below for problem oriented charting. she returns for surveillance follow-up while on niraparib  She tolerated treatment well No recent bleeding or infection Denies abdominal pain or changes in bowel habits  REVIEW OF SYSTEMS:   Constitutional: Denies fevers, chills or abnormal weight loss Eyes: Denies blurriness of vision Ears, nose, mouth, throat, and face: Denies mucositis or sore throat Respiratory: Denies cough, dyspnea or wheezes Cardiovascular: Denies palpitation, chest discomfort or lower extremity swelling Gastrointestinal:  Denies nausea, heartburn or change in bowel habits Skin: Denies abnormal skin rashes Lymphatics: Denies new lymphadenopathy  or easy bruising Neurological:Denies numbness, tingling or new weaknesses Behavioral/Psych: Mood is stable, no new changes  All other systems were reviewed with the patient and are negative.  I have reviewed the past medical history, past surgical history, social history and family history with the patient and they are unchanged from previous note.  ALLERGIES:  is allergic to carboplatin , shellfish allergy, vancomycin , benadryl  [diphenhydramine  hcl], penicillins, and tape.  MEDICATIONS:  Current Outpatient Medications  Medication Sig Dispense Refill   calcium  elemental as carbonate (BARIATRIC TUMS ULTRA) 400 MG chewable tablet Chew 1 tablet by mouth 2 (two) times daily.     Cholecalciferol  (VITAMIN D ) 50 MCG (2000 UT) tablet Take 2,000 Units by mouth at bedtime.     gabapentin  (NEURONTIN ) 300 MG capsule TAKE 3 CAPSULES BY MOUTH TWICE DAILY 120 capsule 11   HYDROcodone -acetaminophen  (NORCO/VICODIN) 5-325 MG tablet Take 1 tablet by mouth every 6 (six) hours as needed for moderate pain (pain score 4-6). 60 tablet 0   insulin  glargine, 1 Unit Dial, (TOUJEO  SOLOSTAR) 300 UNIT/ML Solostar Pen Inject 30 Units into the skin daily.     lidocaine -prilocaine  (EMLA ) cream Apply to Porta-cath  1-2 hours prior to access as directed. (Patient taking differently: Apply 1 application  topically daily as needed (port access).) 30 g 9   niraparib  tosylate (ZEJULA ) 100 MG tablet Take 1 tablet (100 mg total) by mouth daily. 30 tablet 9   ondansetron  (ZOFRAN ) 8 MG tablet TAKE 1 TABLET(8 MG) BY MOUTH EVERY 8 HOURS AS NEEDED 60 tablet 1   OZEMPIC, 0.25 OR 0.5 MG/DOSE, 2 MG/1.5ML SOPN Inject 0.25 mg into the skin every Saturday.     pantoprazole  (PROTONIX ) 40 MG tablet Take 1 tablet (40 mg total)  by mouth daily.     polyethylene glycol (MIRALAX  / GLYCOLAX ) 17 g packet Take 17 g by mouth daily.     rotigotine  (NEUPRO ) 4 MG/24HR APPLY 1 PATCH TOPICALLY TO THE SKIN DAILY 90 patch 3   senna (SENOKOT) 8.6 MG tablet Take 2  tablets by mouth 2 (two) times daily.     No current facility-administered medications for this visit.    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 Had allergic reaction to carboplatin . Avastin  was stopped due to GI hemorrhage   Primary peritoneal carcinomatosis (HCC)  09/06/2011 Imaging   CT findings consistent with cirrhotic changes involving the liver.  No worrisome liver mass.  There are associated portal venous collaterals and splenomegaly consistent with portal venous hypertension. 2.  No other significant upper abdominal findings   11/08/2013 Imaging   US  showed ascites   11/08/2013 Initial Diagnosis   Patient had ~ 2 months of some urinary incontinence, saw Dr Gorge in 10-2013, with pelvic mass on exam    11/12/2013 Imaging   There are findings of progressive hepatic cirrhosis with a large amount of ascites and mild splenomegaly. There are venous collaterals present. 2. There is abnormal thickening of the peritoneal surface along the left mid and lower abdominal wall. There is abnormal soft tissue density in the pelvis as well which could reflect the clinically suspected ovarian malignancy but the findings are nondiagnostic. 3. There is a new small left pleural effusion. 4. There is no acute bowel abnormality.   11/18/2013 Imaging   Successful ultrasound guided diagnostic and therapeutic paracentesis yielding 4.1 liters of ascites.     11/18/2013 Pathology Results   PERITONEAL/ASCITIC FLUID (SPECIMEN 1 OF 1 COLLECTED 11/18/13): SEROUS CARCINOMA, PLEASE SEE COMMENT.   12/01/2013 Tumor Marker   Patient's tumor was tested for the following markers: CA125 Results of the tumor marker test revealed 1938   12/03/2013 Imaging   Successful ultrasound-guided therapeutic paracentesis yielding 3.6 liters of peritoneal fluid.   12/07/2013 - 05/30/2014 Chemotherapy   She received 3 cycles  of carboplatin  and Taxol , interrupted cycle 4 for surgery.  She subsequently completed 3 more cycles of chemotherapy after surgery   12/20/2013 Imaging   Successful ultrasound-guided diagnostic and therapeutic paracentesis yielding 2.2 liters of peritoneal fluid   12/20/2013 Pathology Results   PERITONEAL/ASCITIC FLUID(SPECIMEN 1 OF 1 COLLECTED 12/20/13): MALIGNANT CELLS CONSISTENT WITH SEROUS CARCINOMA   01/13/2014 Tumor Marker   Patient's tumor was tested for the following markers: CA125 Results of the tumor marker test revealed 227   02/07/2014 Tumor Marker   Patient's tumor was tested for the following markers: CA125 Results of the tumor marker test revealed 130   02/15/2014 Genetic Testing   Genetics testing from 02-2014 normal (OvaNext panel)   02/23/2014 Imaging   Interval decrease in ascites. 2. Morphologic changes in the liver consistent with cirrhosis. 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 Portal vein remains patent at this time. 3. Persistent but decreased abnormal soft tissue attenuation tracking in the omental fat. This may be secondary to interval improvement in metastatic disease. 4. Peritoneal thickening seen in the left abdomen and pelvis on the previous study has decreased and nearly resolved in the interval. This also suggests interval improvement in metastatic disease.     03/07/2014 Procedure   Ultrasound and fluoroscopically guided right internal jugular single lumen power port catheter insertion. Tip in the SVC/RA junction. Catheter ready for  use.   03/17/2014 Tumor Marker   Patient's tumor was tested for the following markers: CA125 Results of the tumor marker test revealed 45   03/29/2014 Pathology Results   1. Omentum, resection for tumor - HIGH GRADE SEROUS CARCINOMA, SEE COMMENT. 2. Ovary and fallopian tube, right - HIGH GRADE SEROUS CARCINOMA, SEE COMMENT. 3. Ovary and fallopian tube, left - HIGH GRADE SEROUS CARCINOMA, SEE  COMMENT. Diagnosis Note 1. Nests and clusters of malignant cells are invading the omental tissue (part #1) with associated fibrosis. The cells are pleomorphic with prominent nucleoli. There are scattered psammoma bodies. The ovaries are atrophic and exhibit multiple foci of surface based invasive carcinoma and associated fibrosis. The fallopian tubes have a few foci of carcinoma, also superficially located. There are no precursor lesions noted in the ovaries or fallopian tubes (the entire tubes and ovaries were submitted for evaluation). While there is some retraction artifact, there are several foci suspicious for lymphovascular invasion. 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 Given the fibrosis, there does appear to be a small amount of treatment effect, however, there is abundant residual tumor.    03/29/2014 Surgery   Procedure(s) Performed: 1. Exploratory laparotomy with bilateral salpingo-oophorectomy, omentectomy radical tumor debulking for ovarian cancer .   Surgeon: Maurilio JAYSON Ship, MD.   Assistant Surgeon: Olam Mill, M.D. Assistant: (an MD assistant was necessary for tissue manipulation, retraction and positioning due to the complexity of the case and hospital policies).  Operative Findings: 10cm omental cake from hepatic to splenic flexure, densely adherent to transverse colon. Milliary studding of tumor implants (<80mm, too numerous in number to count) adherent to the mesentery of the small bowel and small bowel wall. Small volume (100cc) ascites. Small ovaries bilaterally, densely adherent to the pelvic cul de sac. Left ovary and tube densely adherent to sigmoid colon. Cirrhotic liver with hepatomegaly and splenomegaly.     This represented an optimal cytoreduction (R1) with visible disease residual on the bowel wall and mesentery (millial, <105mm  implants).      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 2. 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     08/18/2014 Tumor Marker   Patient's tumor was tested for the following markers: CA125 Results of the tumor marker test revealed 16   10/13/2014 Tumor Marker   Patient's tumor was tested for the following markers: CA125 Results of the tumor marker test revealed 14   11/21/2014 Tumor Marker   Patient's tumor was tested for the following markers: CA125 Results of the tumor marker test revealed 16   12/28/2014 Tumor Marker   Patient's tumor was tested for the following markers: CA125 Results of the tumor marker test revealed 762   02/27/2015 Tumor Marker   Patient's tumor was tested for the following markers: CA125 Results of the tumor marker test revealed 20.2   03/22/2015 Imaging   Filler appearance to  the prior exam, with very subtle fluid tracking along portions of the liver, and very minimal nodularity along the right paracolic gutter representing residua from the prior peritoneal cancer. The current abnormalities could simply be post therapy findings rather than necessarily representing residual malignancy. No new or enlarging lesions are identified. 2. Hepatic cirrhosis and splenomegaly. There is some gastric varices suggesting portal venous hypertension. 3. Left foraminal stenosis at L4-5 due to spurring. There is likely also some central narrowing of the thecal sac at this level. 4. Bibasilar  scarring. 5. Chronic mild left mid kidney scarring   03/27/2015 Tumor Marker   Patient's tumor was tested for the following markers: CA125 Results of the tumor marker test revealed 25.3   04/24/2015 Imaging   Disc bulge L2-3 with mild spinal stenosis and right foraminal encroachment. 2. Disc bulge L3-4 with mild spinal stenosis. 3. Multifactorial spinal, left lateral recess and foraminal stenosis L4-5. There is grade 1 anterolisthesis without evident dynamic instability.   05/29/2015 Tumor Marker   Patient's tumor was tested for the following markers: CA125 Results of the tumor marker test revealed 29.9   08/21/2015 Tumor Marker   Patient's tumor was tested for the following markers: CA125 Results of the tumor marker test revealed 45   09/18/2015 Tumor Marker   Patient's tumor was tested for the following markers: CA125 Results of the tumor marker test revealed 47   09/27/2015 Imaging   New small bowel mesenteric nodule and enlarged left external iliac lymph node, highly worrisome for metastatic disease. 2. Cirrhosis and splenomegaly   10/10/2015 Tumor Marker   Patient's tumor was tested for the following markers: CA125 Results of the tumor marker test revealed 53.4   10/10/2015 - 12/11/2015 Chemotherapy   She received 4 cycles of carboplatin  only   11/20/2015 Tumor Marker   Patient's tumor was tested for the following markers: CA125 Results of the tumor marker test revealed 41.3   12/20/2015 Imaging   CT: Mild left external iliac lymphadenopathy is slightly decreased. Mildly enlarged lower mesenteric nodule is slightly decreased. 2. No new or progressive metastatic disease in the abdomen or pelvis. 3. Cirrhosis. Stable subcentimeter hypodense left liver lobe lesion. No new liver lesions. 4. Stable mild splenomegaly.  No ascites. 5. Mild sigmoid diverticulosis.   01/01/2016 -  Anti-estrogen oral therapy   She was placed on tamoxifen . Had letrozole  initially but switched to tamoxifen  due  to poor tolerance   01/15/2016 Tumor Marker   Patient's tumor was tested for the following markers: CA125 Results of the tumor marker test revealed 31.4   02/19/2016 Tumor Marker   Patient's tumor was tested for the following markers: CA125 Results of the tumor marker test revealed 36.5   05/13/2016 Imaging   No evidence of disease progression within the abdomen or pelvis. Previously noted small central mesenteric nodule and left external iliac node appear slightly smaller. 2. Stable changes of hepatic cirrhosis and portal hypertension with associated splenomegaly. No new or enlarging hepatic lesions are identified. 3. No acute findings.   05/13/2016 Tumor Marker   Patient's tumor was tested for the following markers: CA125 Results of the tumor marker test revealed 41.2   06/24/2016 Tumor Marker   Patient's tumor was tested for the following markers: CA125 Results of the tumor marker test revealed 47.3   08/20/2016 Imaging   1. The left external iliac lymph node is slightly increased in size compared to the prior exam, currently 1.1 cm and previously 0.9 cm in diameter. 2. Stable central  mesenteric lymph node at 0.9 cm in diameter. 3. Stable trace free pelvic fluid and stable slight thickening along the right paracolic gutter, without well-defined peritoneal nodularity. 4. Other imaging findings of potential clinical significance: Subsegmental atelectasis or scarring in the lung bases. Hepatic cirrhosis. Left renal scarring. Mild splenomegaly. Sigmoid colon diverticulosis. Impingement at L4-5 due to spondylosis and degenerative disc disease broad Schmorl' s nodes at L3-L4. Pelvic floor laxity   08/23/2016 Tumor Marker   Patient's tumor was tested for the following markers: CA125 Results of the tumor marker test revealed 80.2   09/04/2016 Imaging   LV EF: 60% -   65%   09/12/2016 Tumor Marker   Patient's tumor was tested for the following markers: CA125 Results of the tumor marker test revealed  98.5   09/12/2016 - 01/29/2018 Chemotherapy   The patient received Doxil  and Avastin . Avastin  was discontinued in Dec 2019 due to GI hemorrhage   09/26/2016 Tumor Marker   Patient's tumor was tested for the following markers: CA125 Results of the tumor marker test revealed 68.9   10/10/2016 Tumor Marker   Patient's tumor was tested for the following markers: CA125 Results of the tumor marker test revealed 65.6   11/07/2016 Tumor Marker   Patient's tumor was tested for the following markers: CA125 Results of the tumor marker test revealed 46.4   11/29/2016 Imaging   Stable mild peritoneal thickening, suspicious for peritoneal carcinomatosis. No new or progressive disease identified. No evidence of ascites.  No significant change in 10 mm left external iliac and 8 mm mesenteric lymph nodes.  Stable hepatic cirrhosis, and splenomegaly consistent with portal venous hypertension.  Colonic diverticulosis, without radiographic evidence of diverticulitis.   12/02/2016 Imaging   Normal LV size with EF 55%. Basal inferior and basal inferoseptal hypokinesis. Normal RV size and systolic function. No significant valvular abnormalities.   12/05/2016 Tumor Marker   Patient's tumor was tested for the following markers: CA125 Results of the tumor marker test revealed 49.1   01/09/2017 Tumor Marker   Patient's tumor was tested for the following markers: CA125 Results of the tumor marker test revealed 47.2   02/06/2017 Tumor Marker   Patient's tumor was tested for the following markers: CA125 Results of the tumor marker test revealed 44.1   02/18/2017 Imaging   Stable mild peritoneal thickening. No new or progressive disease identified within the abdomen or pelvis.  Stable tiny sub-cm left external iliac and mesenteric lymph nodes.  Stable hepatic cirrhosis, and splenomegaly consistent with portal venous hypertension. No evidence of hepatic neoplasm.  Colonic diverticulosis, without  radiographic evidence of diverticulitis.   03/06/2017 Tumor Marker   Patient's tumor was tested for the following markers: CA125 Results of the tumor marker test revealed 50.4   03/17/2017 Imaging   - Left ventricle: The cavity size was normal. Wall thickness was normal. Systolic function was normal. The estimated ejection fraction was in the range of 55% to 60%. Wall motion was normal; there were no regional wall motion abnormalities. Features are consistent with a pseudonormal left ventricular filling pattern, with concomitant abnormal relaxation and increased filling pressure (grade 2 diastolic dysfunction). - Mitral valve: There was mild regurgitation. - Left atrium: The atrium was mildly dilated   04/03/2017 Tumor Marker   Patient's tumor was tested for the following markers: CA125 Results of the tumor marker test revealed 47.2   05/14/2017 Imaging   CT scan of abdomen and pelvis Stable mild peritoneal thickening. No new or progressive disease within the abdomen  or pelvis.  Stable hepatic cirrhosis. Stable splenomegaly, consistent with portal venous hypertension. No evidence of hepatic neoplasm.   05/15/2017 Tumor Marker   Patient's tumor was tested for the following markers: CA125 Results of the tumor marker test revealed 56.1   06/12/2017 Tumor Marker   Patient's tumor was tested for the following markers: CA125 Results of the tumor marker test revealed 49.2   07/10/2017 Tumor Marker   Patient's tumor was tested for the following markers: CA125 Results of the tumor marker test revealed 46.3   08/07/2017 Tumor Marker   Patient's tumor was tested for the following markers: CA125 Results of the tumor marker test revealed 52.9   08/20/2017 Imaging   1. Mild omental/peritoneal haziness, unchanged. No evidence of new metastatic disease. 2. Cirrhosis with splenomegaly.   09/04/2017 Tumor Marker   Patient's tumor was tested for the following markers: CA125 Results of the tumor marker test  revealed 48.5   11/13/2017 Tumor Marker   Patient's tumor was tested for the following markers: CA125 Results of the tumor marker test revealed 55.8   12/17/2017 Imaging   1. No findings to suggest metastatic disease in the abdomen or pelvis. 2. Severe hepatic cirrhosis with evidence of portal hypertension, as demonstrated by dilated portal vein, splenomegaly and portosystemic collateral pathways, including gastric and esophageal varices.  3. Colonic diverticulosis without evidence of acute diverticulitis at this time. 4. Additional incidental findings, as above.   01/16/2018 Tumor Marker   Patient's tumor was tested for the following markers: CA125 Results of the tumor marker test revealed 63.7   02/10/2018 Procedure   EGD - Recently bleeding grade III and large (> 5 mm) esophageal varices. Completely eradicated. Banded. - Portal hypertensive gastropathy. - Normal examined duodenum. - No specimens collected   02/13/2018 Tumor Marker   Patient's tumor was tested for the following markers: CA125 Results of the tumor marker test revealed 82.7   02/25/2018 Imaging   Bone density showed mild osteopenia   04/08/2018 Tumor Marker   Patient's tumor was tested for the following markers: CA125 Results of the tumor marker test revealed 82.7   04/08/2018 Imaging   1. No definite omental or peritoneal surface lesions. However, there are 2 slowly enlarging lymph nodes noted. One is in the small bowel mesentery and the other is in the left deep pelvis. Could not exclude recurrent disease. 2. Stable advanced cirrhotic changes involving the liver with portal venous hypertension, portal venous collaterals, esophageal varices and splenomegaly. No worrisome hepatic lesions. 3. Diffuse wall thickening of the colon could suggest diffuse colitis or could be due to low albumin . Recommend correlation with any symptoms such as diarrhea.   05/21/2018 Tumor Marker   Patient's tumor was tested for the following  markers: CA125 Results of the tumor marker test revealed 86   09/07/2018 Tumor Marker   Patient's tumor was tested for the following markers:CA-125 Results of the tumor marker test revealed 111   09/29/2018 Tumor Marker   Patient's tumor was tested for the following markers: CA-125 Results of the tumor marker test revealed 121.   09/29/2018 - 11/24/2018 Chemotherapy   The patient had carboplatin  and gemzar  for chemotherapy treatment.  Chemo was stopped due to allergic reaction to carboplatin    10/27/2018 Tumor Marker   Patient's tumor was tested for the following markers: CA-125 Results of the tumor marker test revealed 99.9   11/24/2018 Tumor Marker   Patient's tumor was tested for the following markers: CA-125 Results of the tumor marker test  revealed 81.2   12/14/2018 Tumor Marker   Patient's tumor was tested for the following markers: CA-125 Results of the tumor marker test revealed 81.2   12/14/2018 Imaging   1. Mild improvement in peritoneal disease. Peritoneal nodule within the left posterior pelvis contiguous with the left side of vaginal cuff is slightly decreased in size in the interval. Within the right iliac fossa there is a small peritoneal nodule which has decreased in size in the interval. Central small bowel mesenteric lymph node is not significantly changed. Slight decrease in size left periaortic lymph node.    2. Morphologic features of the liver compatible with cirrhosis. Splenomegaly is noted which may reflect portal venous hypertension.   12/21/2018 Tumor Marker   Patient's tumor was tested for the following markers: CA-125 Results of the tumor marker test revealed 64.2   12/21/2018 -  Chemotherapy   The patient had taxotere  for chemotherapy treatment.     03/02/2019 Tumor Marker   Patient's tumor was tested for the following markers: CA-125. Results of the tumor marker test revealed 36.3   03/22/2019 Imaging   Mild peritoneal carcinoma shows further  improvement since previous study.   No new or progressive metastatic disease within the abdomen or pelvis.   Hepatic cirrhosis and findings of portal venous hypertension. 1.1 cm low-attenuation lesion within left hepatic lobe, which could represent a regenerative nodule or small hepatocellular carcinoma. Recommend abdomen MRI without and with contrast for further characterization.     04/13/2019 Tumor Marker   Patient's tumor was tested for the following markers: CA-125 Results of the tumor marker test revealed 36.8   05/26/2019 Tumor Marker   Patient's tumor was tested for the following markers: CA-125 Results of the tumor marker test revealed 31.6.   06/15/2019 Tumor Marker   Patient's tumor was tested for the following markers: CA-125 Results of the tumor marker test revealed 25.4   07/05/2019 Imaging   1. Stable CT examination. Stable peritoneal thickening in the pelvis and paracolic gutters. Stable solid left deep pelvic implant. Stable trace ascites. No new or progressive metastatic disease. 2. Stable cirrhosis. No discrete liver masses. 3. Stable mild-to-moderate splenomegaly. 4. Aortic Atherosclerosis (ICD10-I70.0).   07/27/2019 Tumor Marker   Patient's tumor was tested for the following markers: CA-125 Results of the tumor marker test revealed 35.4   08/17/2019 Tumor Marker   Patient's tumor was tested for the following markers: Ca-125 Results of the tumor marker test revealed 35.7.   09/17/2019 - 09/21/2019 Hospital Admission   She was admitted due to esophageal variceal bleeding presents with hematemesis and melanotic stool with epigastric pain.  GI consulted, started on octreotide  infusion.  Underwent EGD which showed minimal bleeding, remain on octreotide  drip, PPI drip.  Hemoglobin trended down therefore received PRBC transfusion.  Over several days her bleeding subsided, hemodynamically remained stable.    09/17/2019 Procedure   EGD report - Esophageal mucosal scarring, from  prior esophageal banding sessions. - Grade I esophageal varices. - Red blood in the cardia and in the gastric fundus. - Portal hypertensive gastropathy. - Normal duodenal bulb, first portion of the duodenum and second portion of the duodenum. 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 Small amount of nonocclusive thrombus demonstrated at the central aspect of  the superior mesenteric vein. 2. Otherwise stable CT exam. Stable peritoneal thickening within the bilateral deep pelvic peritoneum. Stable left deep pelvic implant.No evidence for progressed disease. 3. Stable splenomegaly. 4. Morphologic changes to the liver compatible with cirrhosis. Trace perihepatic and pelvic ascites.   02/22/2020 Imaging   1. New lucent lesion in the left side of the L4 vertebral body measuring 1 cm, concerning for possible osseous metastasis. Consideration for further evaluation with bone scan is suggested. Close attention on follow-up imaging is also recommended to ensure stability. 2. Previously noted peritoneal thickening and nodularity in the low anatomic pelvis has regressed slightly. 3. Borderline enlarged and mildly enlarged pelvic and retroperitoneal lymph nodes appear similar to slightly increased compared to the prior examination, as above. Continued attention on follow-up studies is recommended.  4. 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 5. Aortic atherosclerosis. 6. Additional incidental findings, as above.   02/22/2020 Tumor Marker   Patient's tumor was tested for the following markers: CA-125 Results of the tumor marker test revealed 30.1    04/06/2020 Tumor Marker   Patient's tumor was tested for the following markers: CA-125 Results of the tumor marker test revealed 35.7   09/11/2020 Tumor Marker   Patient's tumor was tested for the following markers: CA-125. Results of the tumor marker test revealed 26.6.   09/12/2020 Imaging   1. Slow enlargement of a currently 2.9 cubic cm soft tissue nodule along the left adnexa. Tumor deposit not excluded. 2. The left external iliac lymph node has substantially reduced in size, currently 0.9 cm in short axis and formerly 1.4 cm. 3. 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 4. Cirrhosis with portal venous hypertension. 5. Mild distal esophageal wall thickening, probably from esophagitis. Splenomegaly. 6. Scarring in the left kidney. 7. Sigmoid colon diverticulosis. 8. Pelvic floor laxity with small cystocele. 9. Lumbar impingement at L2-3, L3-4, and L4-5.     10/23/2020 Tumor Marker   Patient's tumor was tested for the following markers: CA-125. Results of the tumor marker test revealed 35.3.   12/12/2020 Tumor Marker   Patient's tumor was tested for the following markers: CA-125. Results of the tumor marker test revealed 39.1.   12/13/2020 Imaging   1. Continued increase in size of soft tissue nodule within the left posterior pelvis adjacent to the sigmoid colon. Cannot exclude small tumor deposit. 2. Similar appearance of thickening and mild nodularity along the peritoneal reflections of the pelvis and pericolic gutters. 3. Morphologic findings of the liver compatible with cirrhosis. 4. Splenomegaly. 5. Aortic Atherosclerosis (ICD10-I70.0).   01/24/2021 Tumor Marker   Patient's tumor was tested for the following markers: CA-125. Results of the tumor marker test revealed 56.2.   02/09/2021 Imaging   1. Mild interval increased size of the solid left adnexal mass  which may represent tumor recurrence/deposit. The mass abuts the adjacent sigmoid colon as well as the left posterior wall of the urinary bladder, invasion not excluded. 2. Otherwise no new mass or lymphadenopathy identified in the abdomen or pelvis. 3. Hepatic cirrhosis. Splenomegaly consistent with portal hypertension.   04/11/2021 Imaging   1. Left posterior pelvic soft tissue mass, adjacent to the sigmoid colon, has slightly increased in size in comparison to recent prior with significant interval increase in size over more remote priors, findings which are highly suspicious for active neoplastic disease involvement. 2. No new discrete omental or  peritoneal nodules/masses identified. 3. Similar nodular thickening of the peritoneum along the bilateral pericolic gutters and in the pelvis without ascites. 4. No pathologically enlarged abdominal or pelvic lymph nodes and no solid organ metastases visualized. 5. Cirrhotic hepatic morphology with evidence of portal hypertension including splenomegaly. 6. Sigmoid colonic diverticulosis without findings of acute diverticulitis. 7. Moderate volume of formed stool throughout the colon. 8. Aortic Atherosclerosis (ICD10-I70.0).   04/11/2021 Tumor Marker   Patient's tumor was tested for the following markers: CA-125. Results of the tumor marker test revealed 79.4 .   04/23/2021 - 10/09/2021 Chemotherapy   Patient is on Treatment Plan : Ovarian Docetaxel  q21d     04/24/2021 Tumor Marker   Patient's tumor was tested for the following markers: CA-125. Results of the tumor marker test revealed 68.5.   05/16/2021 Tumor Marker   Patient's tumor was tested for the following markers: CA-125. Results of the tumor marker test revealed 68.1.   06/28/2021 Tumor Marker   Patient's tumor was tested for the following markers: CA-125. Results of the tumor marker test revealed 52.   07/16/2021 Imaging   1. Stable cirrhotic changes involving the liver. Associated portal  venous hypertension, portal venous collaterals and splenomegaly. No hepatic lesions. 2. Slight interval decrease in size of the left pelvic implant. 3. Stable minimal peritoneal nodularity involving the pericolic gutters bilaterally. No new or progressive findings.     08/09/2021 Tumor Marker   Patient's tumor was tested for the following markers: CA-125. Results of the tumor marker test revealed 44.1.   09/20/2021 Tumor Marker   Patient's tumor was tested for the following markers: CA-125. Results of the tumor marker test revealed 38.2.   10/31/2021 Imaging   1. Stable exam. No new or progressive interval findings. 2. Stable appearance of the soft tissue nodule in the left pelvis. 3. Subtle omental nodule is similar to prior. The minimal peritoneal nodularity described previously in the para colic gutters is similar to prior. 4. Cirrhosis with splenomegaly. 5. Cholelithiasis. 6. Left colonic diverticulosis without diverticulitis. 7. Aortic Atherosclerosis (ICD10-I70.0).   10/31/2021 Tumor Marker   Patient's tumor was tested for the following markers: Ca-125. Results of the tumor marker test revealed 37.7.   11/01/2021 - 11/01/2021 Chemotherapy   Patient is on Treatment Plan : BREAST Docetaxel  (77) q21d     11/18/2021 -  Chemotherapy   She started on Niraparib    12/03/2021 Tumor Marker   Patient's tumor was tested for the following markers: CA-125. Results of the tumor marker test revealed 45.5.   12/27/2021 Tumor Marker   Patient's tumor was tested for the following markers: CA-125. Results of the tumor marker test revealed 48.3.   01/31/2022 Tumor Marker   Patient's tumor was tested for the following markers: CA-`125. Results of the tumor marker test revealed 43.6.   02/26/2022 Imaging   Stable soft tissue nodule in the low left hemipelvis in the surgical bed of the previous hysterectomy. Additional small areas of thickening along the abdomen without new ascites. There is 1  small asymmetric area of increased thickening along the posterolateral left hemipelvis as above. Attention to this area on short follow-up.   Nodular liver with fatty infiltration splenomegaly.   Gallstones.   Aortic atherosclerosis ICD10-I 70.0   04/01/2022 Tumor Marker   Patient's tumor was tested for the following markers: CA-125. Results of the tumor marker test revealed 49.7.   05/24/2022 Tumor Marker   Patient's tumor was tested for the following markers: CA-125. Results of the tumor  marker test revealed 57.2.   07/11/2022 Imaging   CT ABDOMEN PELVIS W CONTRAST  Result Date: 07/10/2022 CLINICAL DATA:  Ovarian cancer, assess treatment response. Primary peritoneal carcinomatosis. Slowly rising CA 125 levels. * Tracking Code: BO * EXAM: CT ABDOMEN AND PELVIS WITH CONTRAST TECHNIQUE: Multidetector CT imaging of the abdomen and pelvis was performed using the standard protocol following bolus administration of intravenous contrast. RADIATION DOSE REDUCTION: This exam was performed according to the departmental dose-optimization program which includes automated exposure control, adjustment of the mA and/or kV according to patient size and/or use of iterative reconstruction technique. CONTRAST:  OMNIPAQUE  IOHEXOL  300 MG/ML  SOLN COMPARISON:  Multiple prior studies, most recently performed 2022-03-07 and 10/30/2021. FINDINGS: Lower chest: Linear atelectasis or scarring at both lung bases, similar to previous studies. No airspace disease or suspicious nodularity. No significant pleural or pericardial effusion. Bilateral breast implants are partially imaged. Mild distal esophageal wall thickening, unchanged. Hepatobiliary: The liver is normal in density without suspicious focal abnormality. There is diffuse contour irregularity of the liver consistent with cirrhosis. Unchanged punctate calcified gallstone. No gallbladder wall thickening, surrounding inflammation or significant biliary dilatation.  Pancreas: Unremarkable. No pancreatic ductal dilatation or surrounding inflammatory changes. Spleen: Stable mild to moderate splenomegaly. The spleen measures up to 16.9 cm on sagittal image 83/6. No focal splenic abnormalities are identified. Adrenals/Urinary Tract: Both adrenal glands appear normal. No evidence of urinary tract calculus, suspicious renal lesion or hydronephrosis. Stable mild left renal cortical scarring and atrophy. The bladder appears unremarkable for its degree of distention. Stomach/Bowel: Enteric contrast has passed into the proximal colon. The stomach appears unremarkable for its degree of distension. No evidence of bowel wall thickening, distention or surrounding inflammatory change. Mildly prominent stool throughout the colon. Vascular/Lymphatic: There are no enlarged abdominal or pelvic lymph nodes. No acute vascular findings are demonstrated. There is mild aortic and branch vessel atherosclerosis without evidence of aneurysm or large vessel occlusion. Retroaortic left renal vein noted. Reproductive: Hysterectomy. Left pelvic soft tissue nodule adjacent to the vaginal cuff and sigmoid colon is unchanged from the most recent prior studies, measuring 3.1 x 2.2 cm on image 73/2. As previously discussed, this has mildly enlarged compared with older prior studies in 2022. Other: Stable postsurgical changes in the anterior abdominal wall. Mesenteric soft tissue nodule adjacent to the small bowel measuring 1.4 x 1.0 cm on image 54/2 is unchanged. No progressive peritoneal nodularity or recurrent ascites identified. No evidence of pneumoperitoneum or focal extraluminal fluid collection. Musculoskeletal: No acute or significant osseous findings. Stable lower lumbar spondylosis with chronic superior endplate Schmorl's nodes at L3 and L4. IMPRESSION: 1. Stable abdominopelvic CT without evidence of progressive peritoneal nodularity or recurrent ascites. 2. Stable soft tissue nodules in the left pelvis  and small-bowel mesentery, likely stable residual peritoneal disease. No evidence of progressive metastatic disease. 3. Stable findings of cirrhosis and portal hypertension. 4. Cholelithiasis without evidence of cholecystitis or biliary dilatation. 5.  Aortic Atherosclerosis (ICD10-I70.0). Electronically Signed   By: Elsie Perone M.D.   On: 07/10/2022 14:08      09/12/2022 Tumor Marker   Patient's tumor was tested for the following markers: CA-125. Results of the tumor marker test revealed 67.9.   12/24/2022 Imaging   CT ABDOMEN PELVIS W CONTRAST  Result Date: 12/31/2022 CLINICAL DATA:  Ovarian cancer. Primary peritoneal carcinomatosis. Evaluate treatment response. * Tracking Code: BO * EXAM: CT ABDOMEN AND PELVIS WITH CONTRAST TECHNIQUE: Multidetector CT imaging of the abdomen and pelvis was performed using  the standard protocol following bolus administration of intravenous contrast. RADIATION DOSE REDUCTION: This exam was performed according to the departmental dose-optimization program which includes automated exposure control, adjustment of the mA and/or kV according to patient size and/or use of iterative reconstruction technique. CONTRAST:  75mL OMNIPAQUE  IOHEXOL  300 MG/ML  SOLN COMPARISON:  07/09/2022 FINDINGS: Lower chest: Bibasilar scarring. Normal heart size without pericardial or pleural effusion. Bilateral breast implants. Hepatobiliary: Advanced cirrhosis as evidenced by an irregular hepatic capsule and caudate lobe enlargement. No suspicious liver lesion. Tiny gallstones are less apparent today. Pancreas: Normal, without mass or ductal dilatation. Spleen: Presumed splenule within the splenic hilum. Splenomegaly including at 14.1 cm craniocaudal. Adrenals/Urinary Tract: Normal adrenal glands. Normal kidneys, without hydronephrosis. Normal urinary bladder. Stomach/Bowel: Proximal stomach appears thick walled, but is underdistended. Abdominal wall laxity contains nonobstructive transverse  colon on 49/2. Normal terminal ileum. Normal small bowel. Vascular/Lymphatic: Aortic atherosclerosis. Patent portal and splenic veins. Portal vein enlargement at 1.8 cm. No abdominal retroperitoneal adenopathy. Ileocolic mesenteric nodule or node measures 1.1 x 1.7 cm and 55/2 versus 1.1 x 1.2 cm when remeasured in a similar fashion on the prior. No pelvic sidewall adenopathy. Reproductive: Hysterectomy. Other: No significant free fluid. A left pelvic implant measures 3.2 x 2.9 cm on 74/2 versus 3.0 x 2.6 cm when remeasured in a similar fashion on the prior. Musculoskeletal: Osteopenia. Superior endplate compression deformity at L3 is mild. IMPRESSION: 1. 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 2. No new sites of disease. 3. Cirrhosis and portal venous hypertension/splenomegaly. 4. Cholelithiasis 5.  Aortic Atherosclerosis (ICD10-I70.0). Electronically Signed   By: Rockey Kilts M.D.   On: 12/31/2022 09:20        PHYSICAL EXAMINATION: ECOG PERFORMANCE STATUS: 0 - Asymptomatic  Vitals:   02/18/23 0803  BP: (!) 141/76  Pulse: 72  Resp: 18  Temp: (!) 97.4 F (36.3 C)  SpO2: 98%   Filed Weights   02/18/23 0803  Weight: 174 lb 9.6 oz (79.2 kg)    GENERAL:alert, no distress and comfortable LABORATORY DATA:  I have reviewed the data as listed    Component Value Date/Time   NA 142 02/18/2023 0723   NA 142 02/06/2017 0742   K 3.8 02/18/2023 0723   K 3.9 02/06/2017 0742   CL 109 02/18/2023 0723   CO2 25 02/18/2023 0723   CO2 22 02/06/2017 0742   GLUCOSE 113 (H) 02/18/2023 0723   GLUCOSE 156 (H) 02/06/2017 0742   BUN 17 02/18/2023 0723   BUN 13.5 02/06/2017 0742   CREATININE 0.92 02/18/2023 0723   CREATININE 0.93 03/28/2022 0737   CREATININE 0.8 02/06/2017 0742   CALCIUM  9.1 02/18/2023 0723   CALCIUM  8.5 02/06/2017 0742   PROT 7.7 02/18/2023 0723   PROT 6.9 02/06/2017 0742   ALBUMIN  4.2 02/18/2023 0723   ALBUMIN  3.7 02/06/2017 0742   AST 23  02/18/2023 0723   AST 25 03/28/2022 0737   AST 47 (H) 02/06/2017 0742   ALT 21 02/18/2023 0723   ALT 22 03/28/2022 0737   ALT 54 02/06/2017 0742   ALKPHOS 90 02/18/2023 0723   ALKPHOS 130 02/06/2017 0742   BILITOT 0.8 02/18/2023 0723   BILITOT 0.7 03/28/2022 0737   BILITOT 0.65 02/06/2017 0742   GFRNONAA >60 02/18/2023 0723   GFRNONAA >60 03/28/2022 0737   GFRAA >60 11/09/2019 0953   GFRAA >60 10/26/2019 0722    No results found for: SPEP, UPEP  Lab Results  Component Value Date  WBC 3.9 (L) 02/18/2023   NEUTROABS 2.6 02/18/2023   HGB 12.8 02/18/2023   HCT 37.5 02/18/2023   MCV 94.9 02/18/2023   PLT 103 (L) 02/18/2023      Chemistry      Component Value Date/Time   NA 142 02/18/2023 0723   NA 142 02/06/2017 0742   K 3.8 02/18/2023 0723   K 3.9 02/06/2017 0742   CL 109 02/18/2023 0723   CO2 25 02/18/2023 0723   CO2 22 02/06/2017 0742   BUN 17 02/18/2023 0723   BUN 13.5 02/06/2017 0742   CREATININE 0.92 02/18/2023 0723   CREATININE 0.93 03/28/2022 0737   CREATININE 0.8 02/06/2017 0742      Component Value Date/Time   CALCIUM  9.1 02/18/2023 0723   CALCIUM  8.5 02/06/2017 0742   ALKPHOS 90 02/18/2023 0723   ALKPHOS 130 02/06/2017 0742   AST 23 02/18/2023 0723   AST 25 03/28/2022 0737   AST 47 (H) 02/06/2017 0742   ALT 21 02/18/2023 0723   ALT 22 03/28/2022 0737   ALT 54 02/06/2017 0742   BILITOT 0.8 02/18/2023 0723   BILITOT 0.7 03/28/2022 0737   BILITOT 0.65 02/06/2017 0742

## 2023-02-18 NOTE — Assessment & Plan Note (Signed)
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 

## 2023-02-19 LAB — CA 125: Cancer Antigen (CA) 125: 72.4 U/mL — ABNORMAL HIGH (ref 0.0–38.1)

## 2023-02-27 ENCOUNTER — Other Ambulatory Visit: Payer: Self-pay

## 2023-03-02 ENCOUNTER — Other Ambulatory Visit: Payer: Self-pay | Admitting: Hematology and Oncology

## 2023-03-02 DIAGNOSIS — C482 Malignant neoplasm of peritoneum, unspecified: Secondary | ICD-10-CM

## 2023-03-02 DIAGNOSIS — Z7189 Other specified counseling: Secondary | ICD-10-CM

## 2023-03-03 ENCOUNTER — Encounter: Payer: Self-pay | Admitting: Hematology and Oncology

## 2023-03-18 ENCOUNTER — Other Ambulatory Visit (HOSPITAL_COMMUNITY): Payer: Self-pay

## 2023-03-18 NOTE — Progress Notes (Signed)
 Specialty Pharmacy Refill Coordination Note  Olivia Benson is a 78 y.o. female contacted today regarding refills of specialty medication(s) Niraparib  Tosylate (ZEJULA )   Patient requested Delivery   Delivery date: 03/28/23   Verified address: [address] [place] [address]   Medication will be filled on 03/27/23.

## 2023-03-21 ENCOUNTER — Other Ambulatory Visit: Payer: Self-pay

## 2023-04-17 ENCOUNTER — Other Ambulatory Visit (HOSPITAL_COMMUNITY): Payer: Self-pay

## 2023-04-18 ENCOUNTER — Other Ambulatory Visit: Payer: Self-pay

## 2023-04-18 NOTE — Progress Notes (Signed)
 Specialty Pharmacy Refill Coordination Note  Olivia Benson is a 78 y.o. female contacted today regarding refills of specialty medication(s) Niraparib Tosylate (ZEJULA)   Patient requested (Patient-Rptd) Delivery   Delivery date: (Patient-Rptd) 05/15/23   Verified address: (Patient-Rptd) [address]   Medication will be filled on 04.02.25.

## 2023-04-24 ENCOUNTER — Other Ambulatory Visit: Payer: Self-pay | Admitting: Hematology and Oncology

## 2023-04-28 NOTE — Assessment & Plan Note (Signed)
 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 Had allergic reaction to carboplatin. 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 and stability  She has been placed on maintenance niraparib since October 2023 with reasonable disease control Overall, she tolerated treatment well except for mild pancytopenia and recent weight loss and intermittent nausea We will continue treatment as prescribed Plan to repeat imaging study with her next visit in May

## 2023-04-29 ENCOUNTER — Encounter: Payer: Self-pay | Admitting: Hematology and Oncology

## 2023-04-29 ENCOUNTER — Inpatient Hospital Stay: Payer: Medicare Other | Attending: Hematology and Oncology | Admitting: Hematology and Oncology

## 2023-04-29 ENCOUNTER — Inpatient Hospital Stay: Payer: Medicare Other

## 2023-04-29 VITALS — BP 129/63 | HR 72 | Temp 97.4°F | Resp 18 | Ht 61.0 in | Wt 173.0 lb

## 2023-04-29 DIAGNOSIS — Z170 Estrogen receptor positive status [ER+]: Secondary | ICD-10-CM | POA: Insufficient documentation

## 2023-04-29 DIAGNOSIS — C482 Malignant neoplasm of peritoneum, unspecified: Secondary | ICD-10-CM

## 2023-04-29 DIAGNOSIS — M545 Low back pain, unspecified: Secondary | ICD-10-CM | POA: Insufficient documentation

## 2023-04-29 DIAGNOSIS — D61818 Other pancytopenia: Secondary | ICD-10-CM | POA: Diagnosis not present

## 2023-04-29 DIAGNOSIS — R112 Nausea with vomiting, unspecified: Secondary | ICD-10-CM | POA: Insufficient documentation

## 2023-04-29 DIAGNOSIS — G8929 Other chronic pain: Secondary | ICD-10-CM | POA: Insufficient documentation

## 2023-04-29 DIAGNOSIS — Z9221 Personal history of antineoplastic chemotherapy: Secondary | ICD-10-CM | POA: Diagnosis not present

## 2023-04-29 DIAGNOSIS — Z5111 Encounter for antineoplastic chemotherapy: Secondary | ICD-10-CM

## 2023-04-29 LAB — COMPREHENSIVE METABOLIC PANEL
ALT: 21 U/L (ref 0–44)
AST: 25 U/L (ref 15–41)
Albumin: 4.2 g/dL (ref 3.5–5.0)
Alkaline Phosphatase: 99 U/L (ref 38–126)
Anion gap: 7 (ref 5–15)
BUN: 15 mg/dL (ref 8–23)
CO2: 26 mmol/L (ref 22–32)
Calcium: 8.4 mg/dL — ABNORMAL LOW (ref 8.9–10.3)
Chloride: 108 mmol/L (ref 98–111)
Creatinine, Ser: 1 mg/dL (ref 0.44–1.00)
GFR, Estimated: 59 mL/min — ABNORMAL LOW (ref >60)
Glucose, Bld: 105 mg/dL — ABNORMAL HIGH (ref 70–99)
Potassium: 3.8 mmol/L (ref 3.5–5.1)
Sodium: 141 mmol/L (ref 135–145)
Total Bilirubin: 0.6 mg/dL (ref 0.0–1.2)
Total Protein: 7.5 g/dL (ref 6.5–8.1)

## 2023-04-29 LAB — CBC WITH DIFFERENTIAL/PLATELET
Abs Immature Granulocytes: 0.01 10*3/uL (ref 0.00–0.07)
Basophils Absolute: 0 10*3/uL (ref 0.0–0.1)
Basophils Relative: 1 %
Eosinophils Absolute: 0.1 10*3/uL (ref 0.0–0.5)
Eosinophils Relative: 1 %
HCT: 37 % (ref 36.0–46.0)
Hemoglobin: 12.4 g/dL (ref 12.0–15.0)
Immature Granulocytes: 0 %
Lymphocytes Relative: 21 %
Lymphs Abs: 0.9 10*3/uL (ref 0.7–4.0)
MCH: 31.2 pg (ref 26.0–34.0)
MCHC: 33.5 g/dL (ref 30.0–36.0)
MCV: 93.2 fL (ref 80.0–100.0)
Monocytes Absolute: 0.2 10*3/uL (ref 0.1–1.0)
Monocytes Relative: 5 %
Neutro Abs: 2.9 10*3/uL (ref 1.7–7.7)
Neutrophils Relative %: 72 %
Platelets: 94 10*3/uL — ABNORMAL LOW (ref 150–400)
RBC: 3.97 MIL/uL (ref 3.87–5.11)
RDW: 13.7 % (ref 11.5–15.5)
WBC: 4.1 10*3/uL (ref 4.0–10.5)
nRBC: 0 % (ref 0.0–0.2)

## 2023-04-29 MED ORDER — SODIUM CHLORIDE 0.9% FLUSH
10.0000 mL | Freq: Once | INTRAVENOUS | Status: AC
  Administered 2023-04-29: 10 mL

## 2023-04-29 MED ORDER — HEPARIN SOD (PORK) LOCK FLUSH 100 UNIT/ML IV SOLN
500.0000 [IU] | Freq: Once | INTRAVENOUS | Status: AC
  Administered 2023-04-29: 500 [IU]

## 2023-04-29 MED ORDER — HYDROCODONE-ACETAMINOPHEN 5-325 MG PO TABS: 1.0000 | ORAL_TABLET | Freq: Four times a day (QID) | ORAL | 0 refills | Status: DC | PRN

## 2023-04-29 NOTE — Progress Notes (Signed)
 [facility] OFFICE PROGRESS NOTE  Patient Care Team: Stevphen Rochester, MD as PCP - General (Family Medicine) Tat, Octaviano Batty, DO as Consulting Physician (Neurology)  Assessment & Plan Primary peritoneal carcinomatosis [facility]) She was originally diagnosed in 2013, stage III disease with multiple recurrences, with persistent peritoneal disease Pathology: Serous, Negative genetics, ER positive Prior treatment: Had cardiomyopathy that resolved with Doxil. Had allergic reaction to carboplatin. 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 She is not symptomatic I recommend we continue on reduced dose of niraparib at 100 mg daily I will establish threshold for her to continue on treatment as long as her platelet count stays above 75,000 Chronic bilateral low back pain, unspecified whether sciatica present She has chronic lower back pain, stable on prescribed hydrocodone I refilled her prescription today Nausea and vomiting, unspecified vomiting type Multifactorial, could be related to her recent treatment with Ozempic She will continue to take antiemetics as needed  Orders Placed This Encounter  Procedures   CT ABDOMEN PELVIS W CONTRAST    Standing Status:   Future    Expected Date:   06/24/2023    Expiration Date:   04/28/2024    Scheduling Instructions:     No need oral contrast    If indicated for the ordered procedure, I authorize the administration of contrast media per Radiology protocol:    Yes    Does the patient have a contrast media/X-ray dye allergy?:   No    Preferred imaging location?:   [facility]    If indicated for the ordered procedure, I authorize the administration of oral contrast media per Radiology protocol:   No    Reason for no oral contrast::   No need oral contrast     Artis Delay, MD  INTERVAL HISTORY: she returns for treatment follow-up Complications related to previous cycle of chemotherapy included pancytopenia,, nausea with/without vomiting,, weight loss,, and bone aches, She had mild weight loss likely due to recent weight loss medicine, along with her intermittent nausea Denies recent constipation or diarrhea  PHYSICAL EXAMINATION: ECOG PERFORMANCE STATUS: 0 - Asymptomatic  Vitals:   04/29/23 0810  BP: 129/63  Pulse: 72  Resp: 18  Temp: (!) 97.4 F (36.3 C)  SpO2: 100%   Filed Weights   04/29/23 0810  Weight: 173 lb (78.5 kg)    Relevant data reviewed during this visit included CBC and CMP

## 2023-04-29 NOTE — Assessment & Plan Note (Addendum)
She has chronic lower back pain, stable on prescribed hydrocodone I refilled her prescription today

## 2023-04-29 NOTE — Assessment & Plan Note (Addendum)
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 

## 2023-04-29 NOTE — Assessment & Plan Note (Addendum)
 Multifactorial, could be related to her recent treatment with Ozempic She will continue to take antiemetics as needed

## 2023-04-30 ENCOUNTER — Telehealth: Payer: Self-pay | Admitting: Hematology and Oncology

## 2023-04-30 LAB — CA 125: Cancer Antigen (CA) 125: 88.2 U/mL — ABNORMAL HIGH (ref 0.0–38.1)

## 2023-04-30 NOTE — Telephone Encounter (Signed)
 Spoke with patient confirming upcoming appointment

## 2023-05-15 ENCOUNTER — Other Ambulatory Visit (HOSPITAL_COMMUNITY): Payer: Self-pay

## 2023-06-05 ENCOUNTER — Other Ambulatory Visit: Payer: Self-pay

## 2023-06-05 ENCOUNTER — Other Ambulatory Visit: Payer: Self-pay | Admitting: Pharmacy Technician

## 2023-06-05 NOTE — Progress Notes (Signed)
 Specialty Pharmacy Refill Coordination Note  Olivia Benson is a 78 y.o. female contacted today regarding refills of specialty medication(s) Niraparib  Tosylate (ZEJULA )   Patient requested (Patient-Rptd) Delivery   Delivery date: (Patient-Rptd) 06/18/23   Verified address: (Patient-Rptd) [address], [place]. [address]   Medication will be filled on 06/17/23.

## 2023-06-23 ENCOUNTER — Inpatient Hospital Stay: Attending: Hematology and Oncology

## 2023-06-23 DIAGNOSIS — Z79899 Other long term (current) drug therapy: Secondary | ICD-10-CM | POA: Insufficient documentation

## 2023-06-23 DIAGNOSIS — D61818 Other pancytopenia: Secondary | ICD-10-CM | POA: Diagnosis not present

## 2023-06-23 DIAGNOSIS — C482 Malignant neoplasm of peritoneum, unspecified: Secondary | ICD-10-CM | POA: Diagnosis present

## 2023-06-23 DIAGNOSIS — Z5111 Encounter for antineoplastic chemotherapy: Secondary | ICD-10-CM

## 2023-06-23 LAB — CBC WITH DIFFERENTIAL/PLATELET
Abs Immature Granulocytes: 0.01 10*3/uL (ref 0.00–0.07)
Basophils Absolute: 0 10*3/uL (ref 0.0–0.1)
Basophils Relative: 0 %
Eosinophils Absolute: 0.1 10*3/uL (ref 0.0–0.5)
Eosinophils Relative: 2 %
HCT: 37.1 % (ref 36.0–46.0)
Hemoglobin: 12.5 g/dL (ref 12.0–15.0)
Immature Granulocytes: 0 %
Lymphocytes Relative: 21 %
Lymphs Abs: 0.8 10*3/uL (ref 0.7–4.0)
MCH: 31.4 pg (ref 26.0–34.0)
MCHC: 33.7 g/dL (ref 30.0–36.0)
MCV: 93.2 fL (ref 80.0–100.0)
Monocytes Absolute: 0.2 10*3/uL (ref 0.1–1.0)
Monocytes Relative: 6 %
Neutro Abs: 2.7 10*3/uL (ref 1.7–7.7)
Neutrophils Relative %: 71 %
Platelets: 86 10*3/uL — ABNORMAL LOW (ref 150–400)
RBC: 3.98 MIL/uL (ref 3.87–5.11)
RDW: 14 % (ref 11.5–15.5)
WBC: 3.8 10*3/uL — ABNORMAL LOW (ref 4.0–10.5)
nRBC: 0 % (ref 0.0–0.2)

## 2023-06-23 LAB — COMPREHENSIVE METABOLIC PANEL WITH GFR
ALT: 23 U/L (ref 0–44)
AST: 23 U/L (ref 15–41)
Albumin: 4.2 g/dL (ref 3.5–5.0)
Alkaline Phosphatase: 95 U/L (ref 38–126)
Anion gap: 7 (ref 5–15)
BUN: 14 mg/dL (ref 8–23)
CO2: 27 mmol/L (ref 22–32)
Calcium: 9.1 mg/dL (ref 8.9–10.3)
Chloride: 107 mmol/L (ref 98–111)
Creatinine, Ser: 0.93 mg/dL (ref 0.44–1.00)
GFR, Estimated: >60 mL/min (ref >60)
Glucose, Bld: 100 mg/dL — ABNORMAL HIGH (ref 70–99)
Potassium: 4.1 mmol/L (ref 3.5–5.1)
Sodium: 141 mmol/L (ref 135–145)
Total Bilirubin: 0.7 mg/dL (ref 0.0–1.2)
Total Protein: 7.6 g/dL (ref 6.5–8.1)

## 2023-06-23 MED ORDER — SODIUM CHLORIDE 0.9% FLUSH
10.0000 mL | Freq: Once | INTRAVENOUS | Status: AC
  Administered 2023-06-23: 10 mL

## 2023-06-23 MED ORDER — HEPARIN SOD (PORK) LOCK FLUSH 100 UNIT/ML IV SOLN
250.0000 [IU] | Freq: Once | INTRAVENOUS | Status: AC
  Administered 2023-06-23: 250 [IU]

## 2023-06-24 ENCOUNTER — Ambulatory Visit (HOSPITAL_COMMUNITY)
Admission: RE | Admit: 2023-06-24 | Discharge: 2023-06-24 | Disposition: A | Discharge status: Home / Self Care | Source: Ambulatory Visit | Attending: Hematology and Oncology | Admitting: Hematology and Oncology

## 2023-06-24 DIAGNOSIS — C482 Malignant neoplasm of peritoneum, unspecified: Secondary | ICD-10-CM | POA: Diagnosis present

## 2023-06-24 LAB — CA 125: Cancer Antigen (CA) 125: 96 U/mL — ABNORMAL HIGH (ref 0.0–38.1)

## 2023-06-24 MED ORDER — IOHEXOL 300 MG/ML  SOLN
100.0000 mL | Freq: Once | INTRAMUSCULAR | Status: AC | PRN
  Administered 2023-06-24: 100 mL via INTRAVENOUS

## 2023-06-24 MED ORDER — HEPARIN SOD (PORK) LOCK FLUSH 100 UNIT/ML IV SOLN
500.0000 [IU] | Freq: Once | INTRAVENOUS | Status: AC
  Administered 2023-06-24: 500 [IU] via INTRAVENOUS

## 2023-06-24 MED ORDER — SODIUM CHLORIDE (PF) 0.9 % IJ SOLN
INTRAMUSCULAR | Status: AC
  Filled 2023-06-24: qty 50

## 2023-06-24 MED ORDER — HEPARIN SOD (PORK) LOCK FLUSH 100 UNIT/ML IV SOLN
INTRAVENOUS | Status: AC
  Filled 2023-06-24: qty 5

## 2023-06-30 NOTE — Assessment & Plan Note (Addendum)
 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 Had allergic reaction to carboplatin . 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  She has been placed on maintenance niraparib  since October 2023 with reasonable disease control Overall, she tolerated treatment well except for mild pancytopenia and recent weight loss and intermittent nausea I reviewed multiple imaging studies with the patient, comparing recent imaging from May 2025 with prior imaging from November 2024 and May 2024 Over the past year, she has very mild disease progression while on niraparib  but overall asymptomatic We discussed risk and benefits of attempting biopsy to send for more tissue for molecular testing We also discussed risk and benefits of switching her back to chemotherapy After much discussion, she is in agreement not to pursue additional workup and to continue on niraparib  for a few more months given lack of symptoms I plan to see her again in 2 months for further follow-up with plan to repeat imaging study in September or sooner if she is symptomatic

## 2023-07-01 ENCOUNTER — Inpatient Hospital Stay (HOSPITAL_BASED_OUTPATIENT_CLINIC_OR_DEPARTMENT_OTHER): Admitting: Hematology and Oncology

## 2023-07-01 ENCOUNTER — Encounter: Payer: Self-pay | Admitting: Hematology and Oncology

## 2023-07-01 VITALS — BP 132/65 | HR 18 | Resp 98 | Ht 61.0 in | Wt 170.0 lb

## 2023-07-01 DIAGNOSIS — D61818 Other pancytopenia: Secondary | ICD-10-CM

## 2023-07-01 DIAGNOSIS — G8929 Other chronic pain: Secondary | ICD-10-CM | POA: Diagnosis not present

## 2023-07-01 DIAGNOSIS — C482 Malignant neoplasm of peritoneum, unspecified: Secondary | ICD-10-CM | POA: Diagnosis not present

## 2023-07-01 DIAGNOSIS — M545 Low back pain, unspecified: Secondary | ICD-10-CM | POA: Diagnosis not present

## 2023-07-01 MED ORDER — HYDROCODONE-ACETAMINOPHEN 5-325 MG PO TABS: 1.0000 | ORAL_TABLET | Freq: Four times a day (QID) | ORAL | 0 refills | Status: DC | PRN

## 2023-07-01 NOTE — Assessment & Plan Note (Addendum)
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 

## 2023-07-01 NOTE — Assessment & Plan Note (Addendum)
She has chronic lower back pain, stable on prescribed hydrocodone I refilled her prescription today

## 2023-07-01 NOTE — Progress Notes (Signed)
 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 Had allergic reaction to carboplatin . 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  She has been placed on maintenance niraparib  since October 2023 with reasonable disease control Overall, she tolerated treatment well except for mild pancytopenia and recent weight loss and intermittent nausea I reviewed multiple imaging studies with the patient, comparing recent imaging from May 2025 with prior imaging from November 2024 and May 2024 Over the past year, she has very mild disease progression while on niraparib  but overall asymptomatic We discussed risk and benefits of attempting biopsy to send for more tissue for molecular testing We also discussed risk and benefits of switching her back to chemotherapy After much discussion, she is in agreement not to pursue additional workup and to continue on niraparib  for a few more months given lack of symptoms I plan to see her again in 2 months for further follow-up with plan to repeat imaging study in September or sooner if she is symptomatic Pancytopenia, 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    Almeda Jacobs, MD  INTERVAL HISTORY: she returns for treatment follow-up Complications related to previous cycle of chemotherapy included pancytopenia, and bone aches, She denies recent infection The patient denies any recent signs or symptoms of bleeding such as spontaneous epistaxis, hematuria or hematochezia. Her chronic lower back pain is stable I reviewed multiple imaging studies with the patient and discussed risk and benefits of treatment changes versus continue on niraparib    PHYSICAL EXAMINATION: ECOG PERFORMANCE STATUS: 1 - Symptomatic but completely ambulatory  Vitals:   07/01/23 0812  BP: 132/65  Pulse: (!) 18  Resp: (!) 98  SpO2: 97%   Filed Weights   07/01/23 0812  Weight: 170 lb (77.1 kg)    Relevant data reviewed during this visit included CBC, CMP, CA125, CT imaging from May 2025, November 2024 and May 2024

## 2023-07-08 ENCOUNTER — Other Ambulatory Visit: Payer: Self-pay

## 2023-07-10 ENCOUNTER — Other Ambulatory Visit: Payer: Self-pay

## 2023-07-10 ENCOUNTER — Other Ambulatory Visit: Payer: Self-pay | Admitting: Hematology and Oncology

## 2023-07-10 MED ORDER — NIRAPARIB TOSYLATE 100 MG PO TABS
100.0000 mg | ORAL_TABLET | Freq: Every day | ORAL | 9 refills | Status: AC
  Filled 2023-07-10: qty 30, 30d supply, fill #0
  Filled 2023-08-20: qty 30, 30d supply, fill #1
  Filled 2023-09-10: qty 30, 30d supply, fill #2
  Filled 2023-10-14: qty 30, 30d supply, fill #3
  Filled 2023-11-12: qty 30, 30d supply, fill #4
  Filled 2023-12-12: qty 30, 30d supply, fill #5
  Filled 2024-01-06: qty 30, 30d supply, fill #6
  Filled 2024-02-04 – 2024-02-10 (×2): qty 30, 30d supply, fill #7
  Filled 2024-03-03 – 2024-03-19 (×6): qty 30, 30d supply, fill #8

## 2023-07-10 NOTE — Progress Notes (Signed)
 Specialty Pharmacy Refill Coordination Note  Olivia Benson is a 78 y.o. female contacted today regarding refills of specialty medication(s) Niraparib  Tosylate (ZEJULA )   Patient requested (Patient-Rptd) Delivery   Delivery date: (Patient-Rptd) 07/21/23   Verified address: (Patient-Rptd) [address] [place], [place] [address]   Medication will be filled on 06.06.25.   Refill request pending - patient will be notified via mychart.

## 2023-07-18 ENCOUNTER — Other Ambulatory Visit: Payer: Self-pay

## 2023-07-18 ENCOUNTER — Other Ambulatory Visit (HOSPITAL_COMMUNITY): Payer: Self-pay

## 2023-07-18 NOTE — Progress Notes (Signed)
 Specialty Pharmacy Ongoing Clinical Assessment Note  Olivia Benson is a 78 y.o. female who is being followed by the specialty pharmacy service for RxSp Oncology   Patient's specialty medication(s) reviewed today: Niraparib  Tosylate (ZEJULA )   Missed doses in the last 4 weeks: 0   Patient/Caregiver did not have any additional questions or concerns.   Therapeutic benefit summary: Patient is achieving benefit   Adverse events/side effects summary: Experienced adverse events/side effects (some nausea, but tolerable)   Patient's therapy is appropriate to: Continue    Goals Addressed             This Visit's Progress    Slow Disease Progression   Worsening    Patient is not on track and worsening. Patient will maintain adherence, be monitored by provider to determine if a change in treatment plan is warranted, and be evaluated at upcoming provider appointment to assess progress. Per provider notes from 07/01/23, CT was compared from May 2025 to November and May 2024 and showed mild disease progression. Patient and Dr. Marton Sleeper in agreeance to continue on current therapy for 2 months before considering other options. Patient to see provider again in July to come up with game plan.         Follow up: 6 months  [facility]

## 2023-08-12 ENCOUNTER — Inpatient Hospital Stay: Attending: Hematology and Oncology

## 2023-08-12 ENCOUNTER — Encounter: Payer: Self-pay | Admitting: Hematology and Oncology

## 2023-08-12 ENCOUNTER — Inpatient Hospital Stay (HOSPITAL_BASED_OUTPATIENT_CLINIC_OR_DEPARTMENT_OTHER): Admitting: Hematology and Oncology

## 2023-08-12 VITALS — BP 139/76 | HR 70 | Resp 18 | Ht 61.0 in | Wt 169.8 lb

## 2023-08-12 DIAGNOSIS — C482 Malignant neoplasm of peritoneum, unspecified: Secondary | ICD-10-CM | POA: Insufficient documentation

## 2023-08-12 DIAGNOSIS — G8929 Other chronic pain: Secondary | ICD-10-CM | POA: Insufficient documentation

## 2023-08-12 DIAGNOSIS — K769 Liver disease, unspecified: Secondary | ICD-10-CM | POA: Diagnosis not present

## 2023-08-12 DIAGNOSIS — M549 Dorsalgia, unspecified: Secondary | ICD-10-CM | POA: Diagnosis not present

## 2023-08-12 DIAGNOSIS — M545 Low back pain, unspecified: Secondary | ICD-10-CM | POA: Insufficient documentation

## 2023-08-12 DIAGNOSIS — Z5111 Encounter for antineoplastic chemotherapy: Secondary | ICD-10-CM

## 2023-08-12 DIAGNOSIS — D61818 Other pancytopenia: Secondary | ICD-10-CM

## 2023-08-12 DIAGNOSIS — R161 Splenomegaly, not elsewhere classified: Secondary | ICD-10-CM | POA: Diagnosis not present

## 2023-08-12 LAB — CBC WITH DIFFERENTIAL/PLATELET
Abs Immature Granulocytes: 0.01 10*3/uL (ref 0.00–0.07)
Basophils Absolute: 0 10*3/uL (ref 0.0–0.1)
Basophils Relative: 0 %
Eosinophils Absolute: 0.1 10*3/uL (ref 0.0–0.5)
Eosinophils Relative: 2 %
HCT: 35.7 % — ABNORMAL LOW (ref 36.0–46.0)
Hemoglobin: 12 g/dL (ref 12.0–15.0)
Immature Granulocytes: 0 %
Lymphocytes Relative: 25 %
Lymphs Abs: 0.9 10*3/uL (ref 0.7–4.0)
MCH: 31.3 pg (ref 26.0–34.0)
MCHC: 33.6 g/dL (ref 30.0–36.0)
MCV: 93 fL (ref 80.0–100.0)
Monocytes Absolute: 0.2 10*3/uL (ref 0.1–1.0)
Monocytes Relative: 6 %
Neutro Abs: 2.3 10*3/uL (ref 1.7–7.7)
Neutrophils Relative %: 67 %
Platelets: 86 10*3/uL — ABNORMAL LOW (ref 150–400)
RBC: 3.84 MIL/uL — ABNORMAL LOW (ref 3.87–5.11)
RDW: 14.1 % (ref 11.5–15.5)
WBC: 3.4 10*3/uL — ABNORMAL LOW (ref 4.0–10.5)
nRBC: 0 % (ref 0.0–0.2)

## 2023-08-12 LAB — COMPREHENSIVE METABOLIC PANEL WITH GFR
ALT: 17 U/L (ref 0–44)
AST: 20 U/L (ref 15–41)
Albumin: 4 g/dL (ref 3.5–5.0)
Alkaline Phosphatase: 76 U/L (ref 38–126)
Anion gap: 7 (ref 5–15)
BUN: 12 mg/dL (ref 8–23)
CO2: 26 mmol/L (ref 22–32)
Calcium: 8.6 mg/dL — ABNORMAL LOW (ref 8.9–10.3)
Chloride: 109 mmol/L (ref 98–111)
Creatinine, Ser: 0.91 mg/dL (ref 0.44–1.00)
GFR, Estimated: >60 mL/min (ref >60)
Glucose, Bld: 76 mg/dL (ref 70–99)
Potassium: 3.7 mmol/L (ref 3.5–5.1)
Sodium: 142 mmol/L (ref 135–145)
Total Bilirubin: 0.7 mg/dL (ref 0.0–1.2)
Total Protein: 7.1 g/dL (ref 6.5–8.1)

## 2023-08-12 MED ORDER — HYDROCODONE-ACETAMINOPHEN 5-325 MG PO TABS
1.0000 | ORAL_TABLET | Freq: Four times a day (QID) | ORAL | 0 refills | Status: DC | PRN
Start: 2023-08-12 — End: 2023-10-14

## 2023-08-12 MED ORDER — HEPARIN SOD (PORK) LOCK FLUSH 100 UNIT/ML IV SOLN
500.0000 [IU] | Freq: Once | INTRAVENOUS | Status: AC
  Administered 2023-08-12: 500 [IU]

## 2023-08-12 MED ORDER — SODIUM CHLORIDE 0.9% FLUSH
10.0000 mL | Freq: Once | INTRAVENOUS | Status: AC
  Administered 2023-08-12: 10 mL

## 2023-08-12 NOTE — Assessment & Plan Note (Addendum)
She has chronic lower back pain, stable on prescribed hydrocodone I refilled her prescription today

## 2023-08-12 NOTE — Assessment & Plan Note (Addendum)
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 

## 2023-08-12 NOTE — Progress Notes (Signed)
 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 Had allergic reaction to carboplatin . 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  She has been placed on maintenance niraparib  since October 2023 with reasonable disease control Overall, she tolerated treatment well except for mild pancytopenia and recent weight loss and intermittent nausea Over the past year, she has very mild disease progression while on niraparib  but overall asymptomatic After much discussion in May, she is in agreement not to pursue additional workup and to continue on niraparib  for a few more months given lack of symptoms I plan to see her again in 2 months for further follow-up with plan to repeat imaging study in  August or sooner if she is symptomatic She will continue niraparib  for now Pancytopenia, acquired [facility]) 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 She is not symptomatic I recommend we continue on reduced dose of niraparib  at 100 mg daily I will establish threshold for her to continue on treatment as long as her platelet count stays above 75,000 Chronic back pain greater than 3 months duration She has chronic lower back pain, stable on prescribed hydrocodone  I refilled her prescription today  Orders Placed This Encounter  Procedures   CT ABDOMEN PELVIS W CONTRAST    Standing Status:   Future    Expected Date:   10/07/2023    Expiration Date:   08/11/2024    Scheduling Instructions:      No need oral contrast    If indicated for the ordered procedure, I authorize the administration of contrast media per Radiology protocol:   Yes    Does the patient have a contrast media/X-ray dye allergy?:   No    Preferred imaging location?:   [facility]    If indicated for the ordered procedure, I authorize the administration of oral contrast media per Radiology protocol:   No    Reason for no oral contrast::   No need oral contrast     [name], MD  INTERVAL HISTORY: she returns for treatment follow-up Complications related to previous cycle of chemotherapy included cancer associated pain, and bone aches, She denies abdominal bloating, pain or changes in bowel habits  PHYSICAL EXAMINATION: ECOG PERFORMANCE STATUS: 0 - Asymptomatic  Lab Results  Component Value Date   CAN125 96.0 (H) 06/23/2023   CAN125 88.2 (H) 04/29/2023   CAN125 72.4 (H) 02/18/2023      Latest Ref Rng & Units 08/12/2023    8:18 AM 06/23/2023    7:38 AM 04/29/2023    7:39 AM  CBC  WBC 4.0 - 10.5 K/uL 3.4  3.8  4.1   Hemoglobin 12.0 - 15.0 g/dL 87.9  87.4  87.5   Hematocrit 36.0 - 46.0 % 35.7  37.1  37.0   Platelets 150 - 400 K/uL 86  86  94       Chemistry      Component Value Date/Time   NA 142 08/12/2023 0818   NA 142 02/06/2017 0742   K 3.7 08/12/2023 0818   K 3.9 02/06/2017 0742   CL 109  08/12/2023 0818   CO2 26 08/12/2023 0818   CO2 22 02/06/2017 0742   BUN 12 08/12/2023 0818   BUN 13.5 02/06/2017 0742   CREATININE 0.91 08/12/2023 0818   CREATININE 0.93 03/28/2022 0737   CREATININE 0.8 02/06/2017 0742      Component Value Date/Time   CALCIUM  8.6 (L) 08/12/2023 0818   CALCIUM  8.5 02/06/2017 0742   ALKPHOS 76 08/12/2023 0818   ALKPHOS 130 02/06/2017 0742   AST 20 08/12/2023 0818   AST 25 03/28/2022 0737   AST 47 (H) 02/06/2017 0742   ALT 17 08/12/2023 0818   ALT 22 03/28/2022 0737   ALT 54 02/06/2017 0742   BILITOT 0.7 08/12/2023 0818   BILITOT 0.7 03/28/2022 0737    BILITOT 0.65 02/06/2017 0742       Vitals:   08/12/23 0849  BP: 139/76  Pulse: 70  Resp: 18  SpO2: 98%   Filed Weights   08/12/23 0849  Weight: 169 lb 12.8 oz (77 kg)   Other relevant data reviewed during this visit included CBC, CMP, CA125

## 2023-08-12 NOTE — Assessment & Plan Note (Addendum)
 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 Had allergic reaction to carboplatin . 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  She has been placed on maintenance niraparib  since October 2023 with reasonable disease control Overall, she tolerated treatment well except for mild pancytopenia and recent weight loss and intermittent nausea Over the past year, she has very mild disease progression while on niraparib  but overall asymptomatic After much discussion in May, she is in agreement not to pursue additional workup and to continue on niraparib  for a few more months given lack of symptoms I plan to see her again in 2 months for further follow-up with plan to repeat imaging study in  August or sooner if she is symptomatic She will continue niraparib  for now

## 2023-08-13 LAB — CA 125: Cancer Antigen (CA) 125: 85.2 U/mL — ABNORMAL HIGH (ref 0.0–38.1)

## 2023-08-20 ENCOUNTER — Other Ambulatory Visit: Payer: Self-pay

## 2023-08-20 ENCOUNTER — Other Ambulatory Visit (HOSPITAL_COMMUNITY): Payer: Self-pay

## 2023-08-20 NOTE — Progress Notes (Signed)
 Specialty Pharmacy Refill Coordination Note  Olivia Benson is a 78 y.o. female contacted today regarding refills of specialty medication(s) Niraparib  Tosylate (ZEJULA )   Patient requested Delivery   Delivery date: 08/22/23   Verified address: [address] [place], [place] [address]   Medication will be filled on 08/21/23.

## 2023-09-09 ENCOUNTER — Encounter (INDEPENDENT_AMBULATORY_CARE_PROVIDER_SITE_OTHER): Payer: Self-pay

## 2023-09-10 ENCOUNTER — Other Ambulatory Visit: Payer: Self-pay

## 2023-09-10 NOTE — Progress Notes (Signed)
 Specialty Pharmacy Refill Coordination Note  Olivia Benson is a 78 y.o. female contacted today regarding refills of specialty medication(s) Niraparib  Tosylate (ZEJULA )   Patient requested (Patient-Rptd) Delivery   Delivery date: 09/18/23   Verified address: (Patient-Rptd) [address] [place], [place]. [address]   Medication will be filled on 09/17/23.

## 2023-09-11 ENCOUNTER — Other Ambulatory Visit: Payer: Self-pay

## 2023-10-07 ENCOUNTER — Inpatient Hospital Stay: Attending: Hematology and Oncology

## 2023-10-07 ENCOUNTER — Ambulatory Visit (HOSPITAL_COMMUNITY)
Admission: RE | Admit: 2023-10-07 | Discharge: 2023-10-07 | Disposition: A | Discharge status: Home / Self Care | Source: Ambulatory Visit | Attending: Hematology and Oncology

## 2023-10-07 DIAGNOSIS — Z79899 Other long term (current) drug therapy: Secondary | ICD-10-CM | POA: Insufficient documentation

## 2023-10-07 DIAGNOSIS — C482 Malignant neoplasm of peritoneum, unspecified: Secondary | ICD-10-CM | POA: Insufficient documentation

## 2023-10-07 LAB — CBC WITH DIFFERENTIAL/PLATELET
Abs Immature Granulocytes: 0.01 K/uL (ref 0.00–0.07)
Basophils Absolute: 0 K/uL (ref 0.0–0.1)
Basophils Relative: 0 %
Eosinophils Absolute: 0.1 K/uL (ref 0.0–0.5)
Eosinophils Relative: 2 %
HCT: 36.2 % (ref 36.0–46.0)
Hemoglobin: 12.5 g/dL (ref 12.0–15.0)
Immature Granulocytes: 0 %
Lymphocytes Relative: 23 %
Lymphs Abs: 0.9 K/uL (ref 0.7–4.0)
MCH: 32.1 pg (ref 26.0–34.0)
MCHC: 34.5 g/dL (ref 30.0–36.0)
MCV: 93.1 fL (ref 80.0–100.0)
Monocytes Absolute: 0.2 K/uL (ref 0.1–1.0)
Monocytes Relative: 6 %
Neutro Abs: 2.6 K/uL (ref 1.7–7.7)
Neutrophils Relative %: 69 %
Platelets: 89 K/uL — ABNORMAL LOW (ref 150–400)
RBC: 3.89 MIL/uL (ref 3.87–5.11)
RDW: 13.9 % (ref 11.5–15.5)
WBC: 3.8 K/uL — ABNORMAL LOW (ref 4.0–10.5)
nRBC: 0 % (ref 0.0–0.2)

## 2023-10-07 LAB — COMPREHENSIVE METABOLIC PANEL WITH GFR
ALT: 19 U/L (ref 0–44)
AST: 25 U/L (ref 15–41)
Albumin: 4.2 g/dL (ref 3.5–5.0)
Alkaline Phosphatase: 85 U/L (ref 38–126)
Anion gap: 7 (ref 5–15)
BUN: 14 mg/dL (ref 8–23)
CO2: 27 mmol/L (ref 22–32)
Calcium: 8.6 mg/dL — ABNORMAL LOW (ref 8.9–10.3)
Chloride: 107 mmol/L (ref 98–111)
Creatinine, Ser: 0.95 mg/dL (ref 0.44–1.00)
GFR, Estimated: >60 mL/min (ref >60)
Glucose, Bld: 86 mg/dL (ref 70–99)
Potassium: 3.7 mmol/L (ref 3.5–5.1)
Sodium: 141 mmol/L (ref 135–145)
Total Bilirubin: 0.7 mg/dL (ref 0.0–1.2)
Total Protein: 7.2 g/dL (ref 6.5–8.1)

## 2023-10-07 MED ORDER — HEPARIN SOD (PORK) LOCK FLUSH 100 UNIT/ML IV SOLN
500.0000 [IU] | Freq: Once | INTRAVENOUS | Status: AC
  Administered 2023-10-07: 500 [IU] via INTRAVENOUS

## 2023-10-07 MED ORDER — IOHEXOL 300 MG/ML  SOLN
100.0000 mL | Freq: Once | INTRAMUSCULAR | Status: AC | PRN
  Administered 2023-10-07: 100 mL via INTRAVENOUS

## 2023-10-08 LAB — CA 125: Cancer Antigen (CA) 125: 114 U/mL — ABNORMAL HIGH (ref 0.0–38.1)

## 2023-10-10 ENCOUNTER — Other Ambulatory Visit: Payer: Self-pay

## 2023-10-10 ENCOUNTER — Encounter (INDEPENDENT_AMBULATORY_CARE_PROVIDER_SITE_OTHER): Payer: Self-pay

## 2023-10-14 ENCOUNTER — Encounter: Payer: Self-pay | Admitting: Hematology and Oncology

## 2023-10-14 ENCOUNTER — Other Ambulatory Visit: Payer: Self-pay

## 2023-10-14 ENCOUNTER — Inpatient Hospital Stay: Attending: Hematology and Oncology | Admitting: Hematology and Oncology

## 2023-10-14 ENCOUNTER — Other Ambulatory Visit: Payer: Self-pay | Admitting: Pharmacy Technician

## 2023-10-14 ENCOUNTER — Ambulatory Visit: Payer: Self-pay | Admitting: Hematology and Oncology

## 2023-10-14 VITALS — BP 138/61 | HR 72 | Resp 18 | Ht 61.0 in | Wt 162.8 lb

## 2023-10-14 DIAGNOSIS — C482 Malignant neoplasm of peritoneum, unspecified: Secondary | ICD-10-CM | POA: Insufficient documentation

## 2023-10-14 DIAGNOSIS — M549 Dorsalgia, unspecified: Secondary | ICD-10-CM | POA: Diagnosis not present

## 2023-10-14 DIAGNOSIS — K5900 Constipation, unspecified: Secondary | ICD-10-CM | POA: Insufficient documentation

## 2023-10-14 DIAGNOSIS — G8929 Other chronic pain: Secondary | ICD-10-CM

## 2023-10-14 DIAGNOSIS — Z170 Estrogen receptor positive status [ER+]: Secondary | ICD-10-CM | POA: Insufficient documentation

## 2023-10-14 DIAGNOSIS — M5459 Other low back pain: Secondary | ICD-10-CM | POA: Insufficient documentation

## 2023-10-14 DIAGNOSIS — D61818 Other pancytopenia: Secondary | ICD-10-CM | POA: Diagnosis not present

## 2023-10-14 DIAGNOSIS — R110 Nausea: Secondary | ICD-10-CM | POA: Insufficient documentation

## 2023-10-14 MED ORDER — HYDROCODONE-ACETAMINOPHEN 5-325 MG PO TABS: 1.0000 | ORAL_TABLET | Freq: Four times a day (QID) | ORAL | 0 refills | Status: DC | PRN

## 2023-10-14 NOTE — Progress Notes (Signed)
 Specialty Pharmacy Refill Coordination Note  Olivia Benson is a 78 y.o. female contacted today regarding refills of specialty medication(s) Niraparib  Tosylate (ZEJULA )   Patient requested Delivery Delivery date: 10/21/23 Verified address:  [address], [place], [place]. [address]  Medication will be filled on 10/20/23.    Patient answered questionnaire.

## 2023-10-14 NOTE — Assessment & Plan Note (Addendum)
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 

## 2023-10-14 NOTE — Assessment & Plan Note (Addendum)
She has chronic lower back pain, stable on prescribed hydrocodone I refilled her prescription today

## 2023-10-14 NOTE — Assessment & Plan Note (Addendum)
 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 Had allergic reaction to carboplatin . 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  She has been placed on maintenance niraparib  since October 2023 with reasonable disease control Overall, she tolerated treatment well except for mild pancytopenia and recent weight loss and intermittent nausea Over the past year, she has very mild disease progression while on niraparib  but overall asymptomatic I have reviewed multiple imaging studies with the patient Despite elevated tumor markers, she had minimum progression of her peritoneal disease She is comfortable to continue on niraparib  for now I plan to see her again next month for further follow-up I plan to repeat imaging study again in December

## 2023-10-14 NOTE — Telephone Encounter (Signed)
 Called patient to let her know that her CT was stable. She had no further questions. Andrea CHRISTELLA Plunk, RN

## 2023-10-14 NOTE — Telephone Encounter (Signed)
-----   Message from [name] sent at 10/14/2023  9:56 AM EDT ----- Ct looks stable, please call her and let her know ----- Message ----- From: Interface, Rad Results In Sent: 10/14/2023   9:42 AM EDT To: [name], MD

## 2023-10-14 NOTE — Progress Notes (Signed)
 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 Had allergic reaction to carboplatin . 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  She has been placed on maintenance niraparib  since October 2023 with reasonable disease control Overall, she tolerated treatment well except for mild pancytopenia and recent weight loss and intermittent nausea Over the past year, she has very mild disease progression while on niraparib  but overall asymptomatic I have reviewed multiple imaging studies with the patient Despite elevated tumor markers, she had minimum progression of her peritoneal disease She is comfortable to continue on niraparib  for now I plan to see her again next month for further follow-up I plan to repeat imaging study again in December Pancytopenia, acquired [facility]) 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    Olivia Bedford, MD  INTERVAL HISTORY: she returns for treatment follow-up on maintenance niraparib  Complications related to previous cycle of chemotherapy included pancytopenia, and nausea  with/without vomiting, Overall, she is quite asymptomatic from treatment She had chronic nausea but no vomiting She has chronic constipation on regular laxatives The patient denies any recent signs or symptoms of bleeding such as spontaneous epistaxis, hematuria or hematochezia. I reviewed multiple imaging studies with the patient and discussed plan of care  PHYSICAL EXAMINATION: ECOG PERFORMANCE STATUS: 1 - Symptomatic but completely ambulatory  Lab Results  Component Value Date   CAN125 114.0 (H) 10/07/2023   CAN125 85.2 (H) 08/12/2023   CAN125 96.0 (H) 06/23/2023      Latest Ref Rng & Units 10/07/2023    7:42 AM 08/12/2023    8:18 AM 06/23/2023    7:38 AM  CBC  WBC 4.0 - 10.5 K/uL 3.8  3.4  3.8   Hemoglobin 12.0 - 15.0 g/dL 87.4  87.9  87.4   Hematocrit 36.0 - 46.0 % 36.2  35.7  37.1   Platelets 150 - 400 K/uL 89  86  86       Chemistry      Component Value Date/Time   NA 141 10/07/2023 0742   NA 142 02/06/2017 0742   K 3.7 10/07/2023 0742   K 3.9 02/06/2017 0742   CL 107 10/07/2023 0742   CO2 27 10/07/2023 0742   CO2 22 02/06/2017 0742   BUN 14 10/07/2023 0742   BUN 13.5 02/06/2017 0742   CREATININE 0.95 10/07/2023 0742   CREATININE 0.93 03/28/2022 0737   CREATININE 0.8 02/06/2017 0742      Component Value Date/Time   CALCIUM  8.6 (L) 10/07/2023 0742   CALCIUM  8.5 02/06/2017 0742   ALKPHOS 85 10/07/2023 0742   ALKPHOS 130 02/06/2017 0742   AST 25 10/07/2023 0742   AST 25 03/28/2022 0737   AST 47 (H)  02/06/2017 0742   ALT 19 10/07/2023 0742   ALT 22 03/28/2022 0737   ALT 54 02/06/2017 0742   BILITOT 0.7 10/07/2023 0742   BILITOT 0.7 03/28/2022 0737   BILITOT 0.65 02/06/2017 0742       Vitals:   10/14/23 0910  BP: 138/61  Pulse: 72  Resp: 18  SpO2: 100%   Filed Weights   10/14/23 0910  Weight: 162 lb 12.8 oz (73.8 kg)   Other relevant data reviewed during this visit included CBC, CMP, CA125, CT imaging from August 2025 in comparison with imaging  from May 2025

## 2023-10-20 ENCOUNTER — Other Ambulatory Visit: Payer: Self-pay

## 2023-10-28 ENCOUNTER — Other Ambulatory Visit: Payer: Self-pay | Admitting: Gastroenterology

## 2023-10-28 DIAGNOSIS — I728 Aneurysm of other specified arteries: Secondary | ICD-10-CM

## 2023-11-05 ENCOUNTER — Ambulatory Visit
Admission: RE | Admit: 2023-11-05 | Discharge: 2023-11-05 | Disposition: A | Discharge status: Home / Self Care | Source: Ambulatory Visit | Attending: Gastroenterology | Admitting: Gastroenterology

## 2023-11-05 DIAGNOSIS — I728 Aneurysm of other specified arteries: Secondary | ICD-10-CM

## 2023-11-05 HISTORY — PX: IR RADIOLOGIST EVAL & MGMT: IMG5224

## 2023-11-05 NOTE — Consult Note (Signed)
 Chief Complaint: Consult for splenic artery aneurysm     Referring Provider(s): Saintclair Jasper   Patient Status: [facility] --Outpatient  History of Present Illness: Olivia Benson is a 78 y.o. female Who had follow-up CT of the abdomen and pelvis with contrast performed 9 04/03/2023. A 2.6 cm x 2.3 cm x 2.5 cm round enhancing structure is seen in the splenic hilum and most consistent with splenic artery aneurysm. Imaging going back to 09/06/2011 and 11/12/2013 both study CT of the abdomen with contrast demonstrate likely beginning of aneurysm. The images were shown to the patient in order to describe the aneurysm and potential means of therapy excluding aneurysmal wall. Pathophysiology and likelihood of rupture was discussed in detail. Although the patient is no longer within the age of expecting pregnancy in the near future given the size overall growth over the last several years the patient is interested in having the aneurysm treated. We discussed direct coiling of aneurysm as well as embolizing the spleen and were splenic artery as well as covered stent placement. The exact method of treatment to exclude the aneurysm for flow will need to be performed on day of the procedure and will likely depend on the overall anatomy size of the aneurysm neck. The patient and her husband (was present for the entire visit) had numerous questions all of which satisfaction.    The procedure, risks, benefits, and alternatives were discussed in detail. The patient is interested in proceeding with minimally invasive occlusion of the aneurysm endovascular technique under moderate sedation.     Past Medical History:  Diagnosis Date   Asthma    CHF (congestive heart failure) (HCC)    Cirrhosis (HCC)    Complication of anesthesia    slow to wake up / n & v   Diabetes mellitus    Elevated LFTs    Family history of adverse reaction to anesthesia    multiple family members have had problems such as slow to wake /  flat lined /  ventilator     Frequency of urination    Glaucoma, narrow-angle    History of radiation therapy    Pelvis- 06/27/20-07/11/20 Dr. 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   my cancer is back and im restarting chemo on tuesday 09-25-2018]   Overactive bladder    PONV (postoperative nausea and vomiting)    no problems with feb  egd    Portal hypertension (HCC)    Restless leg syndrome    Tingling    hands and feet    Past Surgical History:  Procedure Laterality Date   ABDOMINAL ULTRASOUND  Sept 2012   No gallbladder disease, + fatty liver   CARDIAC CATHETERIZATION  10/25/2010   EF 60%; Normal coronaries   COLONOSCOPY  10/18/2005   COLONOSCOPY WITH PROPOFOL  N/A 09/28/2018   Procedure: COLONOSCOPY WITH PROPOFOL ;  Surgeon: Saintclair Jasper, MD;  Location: [facility];  Service: Gastroenterology;  Laterality: N/A;   COSMETIC SURGERY  2003   DENTAL SURGERY     ESOPHAGEAL BANDING  02/10/2018   Procedure: ESOPHAGEAL BANDING;  Surgeon: Saintclair Jasper, MD;  Location: [facility];  Service: Gastroenterology;;   ESOPHAGEAL BANDING N/A 03/23/2018   Procedure: ESOPHAGEAL BANDING;  Surgeon: Saintclair Jasper, MD;  Location: [facility];  Service: Gastroenterology;  Laterality: N/A;   ESOPHAGEAL BANDING N/A 09/28/2018   Procedure: ESOPHAGEAL BANDING;  Surgeon: Saintclair Jasper, MD;  [facility]: [facility];  Service: Gastroenterology;  Laterality: N/A;   ESOPHAGEAL BANDING  03/01/2019   Procedure: ESOPHAGEAL BANDING;  Surgeon: Saintclair Jasper, MD;  [facility];  Service: Gastroenterology;;   ESOPHAGOGASTRODUODENOSCOPY N/A 03/23/2018   Procedure: ESOPHAGOGASTRODUODENOSCOPY  (EGD);  Surgeon: Saintclair Jasper, MD;  Location: [facility];  Service: Gastroenterology;  Laterality: N/A;   ESOPHAGOGASTRODUODENOSCOPY N/A 09/17/2019   Procedure: ESOPHAGOGASTRODUODENOSCOPY (EGD);  Surgeon: Burnette Fallow, MD;  Location: [facility];  Service: Endoscopy;  Laterality: N/A;   ESOPHAGOGASTRODUODENOSCOPY (EGD) WITH PROPOFOL  N/A 02/10/2018   Procedure: ESOPHAGOGASTRODUODENOSCOPY (EGD) WITH PROPOFOL ;  Surgeon: Saintclair Jasper, MD;  [facility];  Service: Gastroenterology;  Laterality: N/A;   ESOPHAGOGASTRODUODENOSCOPY (EGD) WITH PROPOFOL  N/A 09/28/2018   Procedure: ESOPHAGOGASTRODUODENOSCOPY (EGD) WITH PROPOFOL ;  Surgeon: Saintclair Jasper, MD;  [facility];  Service: Gastroenterology;  Laterality: N/A;   ESOPHAGOGASTRODUODENOSCOPY (EGD) WITH PROPOFOL  N/A 03/01/2019   Procedure: ESOPHAGOGASTRODUODENOSCOPY (EGD) WITH PROPOFOL  with banding;  Surgeon: Saintclair Jasper, MD;  [facility];  Service: Gastroenterology;  Laterality: N/A;   ESOPHAGOGASTRODUODENOSCOPY (EGD) WITH PROPOFOL  N/A 05/24/2019   Procedure: ESOPHAGOGASTRODUODENOSCOPY (EGD) WITH PROPOFOL ;  Surgeon: Saintclair Jasper, MD;  [facility];  Service: Gastroenterology;  Laterality: N/A;   ESOPHAGOGASTRODUODENOSCOPY (EGD) WITH PROPOFOL  N/A 06/13/2020   Procedure: ESOPHAGOGASTRODUODENOSCOPY (EGD) WITH PROPOFOL ;  Surgeon: Saintclair Jasper, MD;  [facility];  Service: Gastroenterology;  Laterality: N/A;   ESOPHAGOGASTRODUODENOSCOPY (EGD) WITH PROPOFOL  N/A 07/25/2021   Procedure: ESOPHAGOGASTRODUODENOSCOPY (EGD) WITH PROPOFOL ;  Surgeon: Saintclair Jasper, MD;  [facility];  Service: Gastroenterology;  Laterality: N/A;   EYE SURGERY Bilateral 09/19/14 R eye, 08/2014 L eye   open angles in both eyes d/t narrow angle glaucoma   GASTRIC VARICES BANDING N/A 05/24/2019   Procedure: GASTRIC VARICES BANDING;  Surgeon: Saintclair Jasper, MD;  [facility];  Service: Gastroenterology;  Laterality: N/A;   GASTRIC  VARICES BANDING N/A 06/13/2020   Procedure: GASTRIC VARICES BANDING;  Surgeon: Saintclair Jasper, MD;  Location: [facility];  Service: Gastroenterology;  Laterality: N/A;   LAPAROTOMY N/A 03/29/2014   Procedure: EXPLORATORY LAPAROTOMY;  Surgeon: Maurilio Ship, MD;  Location: [facility];  Service: Gynecology;  Laterality: N/A;   ORIF ANKLE FRACTURE Right 03/09/2018   Procedure: OPEN REDUCTION INTERNAL FIXATION (ORIF) RIGHT LATERAL MALLEOLUS;  Surgeon: Jerri Kay HERO, MD;  Location: [facility];  Service: Orthopedics;  Laterality: Right;   PARTIAL HYSTERECTOMY  1988   POLYPECTOMY  09/28/2018   Procedure: POLYPECTOMY;  Surgeon: Saintclair Jasper, MD;  Location: [facility];  Service: Gastroenterology;;   SALPINGOOPHORECTOMY Bilateral 03/29/2014   Procedure: BILATERAL SALPINGO OOPHORECTOMY, OMENTECTOMY, DEBULKING;  Surgeon: Maurilio Ship, MD;  Location: [facility];  Service: Gynecology;  Laterality: Bilateral;   TONSILLECTOMY  1971   TUBAL LIGATION     WH-MAMMOGRAPHY  11/24/2009    Allergies: Carboplatin , Shellfish allergy, Vancomycin , Benadryl  [diphenhydramine  hcl], Penicillins, and Tape  Medications: Prior to Admission medications   Medication Sig Start Date End Date Taking? Authorizing Provider  calcium  elemental as carbonate (BARIATRIC TUMS ULTRA) 400 MG chewable tablet Chew 1 tablet by mouth 2 (two) times daily.    [provider]  Cholecalciferol  (VITAMIN D ) 50 MCG (2000 UT) tablet Take 2,000 Units by mouth at bedtime.    [provider]  gabapentin  (NEURONTIN ) 300 MG capsule TAKE 3 CAPSULES BY MOUTH TWICE DAILY 04/24/23   Lonn Hicks, MD  HYDROcodone -acetaminophen  (NORCO/VICODIN) 5-325 MG tablet Take 1 tablet by mouth every 6 (six) hours as needed for moderate pain (pain score 4-6). 10/14/23   Lonn Hicks, MD  insulin  glargine, 1 Unit Dial, (TOUJEO  SOLOSTAR) 300 UNIT/ML Solostar Pen Inject 30 Units into the skin daily.    [provider]  lidocaine -prilocaine  (EMLA ) cream Apply to Porta-cath  1-2  hours prior to access as directed. Patient taking differently: Apply 1 application  topically daily as needed (port access). 11/13/17   Lonn Hicks, MD  niraparib  tosylate (ZEJULA ) 100 MG tablet Take 1 tablet (100 mg total) by mouth daily. 07/10/23   Lonn Hicks, MD  ondansetron  (ZOFRAN ) 8 MG tablet TAKE 1 TABLET(8 MG) BY MOUTH EVERY 8 HOURS AS NEEDED 01/20/23   Lonn, Ni, MD  OZEMPIC, 1 MG/DOSE, 4 MG/3ML SOPN Inject 1 mg into the skin once a week.    [provider]  pantoprazole  (PROTONIX ) 40 MG tablet Take 1 tablet (40 mg total) by mouth daily. 07/09/18   Lonn Hicks, MD  polyethylene glycol (MIRALAX  / GLYCOLAX ) 17 g packet Take 17 g by mouth daily.    [provider]  rotigotine  (NEUPRO ) 4 MG/24HR APPLY 1 PATCH TOPICALLY TO THE SKIN DAILY 11/28/22   Tat, Asberry RAMAN, DO  senna (SENOKOT) 8.6 MG tablet Take 2 tablets by mouth 2 (two) times daily.    [provider]     Family History  Problem Relation Age of Onset   Asthma Mother    Diabetes Mother    Hypertension Mother    Allergies Mother    Rheum arthritis Mother    Bladder Cancer Father 23       former heavy smoker   Hypertension Father    Stroke Father    Breast cancer Sister 2   Diabetes Sister        x 3   Allergies Sister    Asthma Sister        x 2    Breast cancer Maternal Aunt 67   Breast cancer Cousin 8       (maternal) bilateral cancer   Aneurysm Son 37   Diabetes Brother    Allergies Brother    Asthma Brother     Social History   Socioeconomic History   Marital status: Married    Spouse name: Not on file   Number of children: 1 D   Years of education: Not on file   Highest education level: Not on file  Occupational History   Occupation: Retired  Tobacco Use   Smoking status: Former    Current packs/day: 0.00    Average packs/day: 1 pack/day for 1 year (1.0 ttl pk-yrs)    Types: Cigarettes    Start date: 02/11/1981    Quit date: 02/11/1982    Years since quitting: 41.7    Smokeless tobacco: Never  Vaping Use   Vaping status: Never Used  Substance and Sexual Activity   Alcohol use: No    Alcohol/week: 0.0 standard drinks of alcohol   Drug use: No   Sexual activity: Not on file  Other Topics Concern   Not on file  Social History Narrative   Left handed   Lives in single story home with husband   Social Drivers of Health   Financial Resource Strain: Low Risk  (10/13/2022)   [facility]  Overall Financial Resource Strain (CARDIA)    Difficulty of Paying Living Expenses: Not hard at all  Food Insecurity: No Food Insecurity (10/13/2022)   [facility]   Hunger Vital Sign    Within the past 12 months, you worried that your food would run out before you got the money to buy more.: Never true    Within the past 12 months, the food you bought just didn't last and you didn't have money to get more.: Never true  Transportation Needs: No Transportation Needs (10/13/2022)   Received from [facility] - Transportation    Lack of Transportation (Medical): No    Lack of Transportation (Non-Medical): No  Physical Activity: Unknown (10/13/2022)   Received from [facility]   Exercise Vital Sign    On average, how many days per week do you engage in moderate to strenuous exercise (like a brisk walk)?: 0 days    Minutes of Exercise per Session: Not on file  Stress: No Stress Concern Present (10/13/2022)   Received from [facility] - Occupational Stress Questionnaire    Feeling of Stress : Not at all  Social Connections: Socially Integrated (10/13/2022)   Received from [facility]   Social Network    How would you rate your social network (family, work, friends)?: Good participation with social networks     Review of Systems: A 12 point ROS discussed and pertinent positives are indicated in the HPI above.  All other systems are negative.  Review of Systems  All other systems  reviewed and are negative.   Vital Signs: BP (!) 146/79 (BP Location: Left Arm, Patient Position: Sitting, Cuff Size: Normal)   Pulse 68   Temp 97.8 F (36.6 C) (Oral)   Resp 18   SpO2 96%      Physical Exam Constitutional:      Appearance: Normal appearance.  HENT:     Head: Normocephalic and atraumatic.  Cardiovascular:     Rate and Rhythm: Normal rate and regular rhythm.     Heart sounds: Normal heart sounds.  Pulmonary:     Effort: Pulmonary effort is normal.     Breath sounds: Normal breath sounds.  Skin:    General: Skin is warm and dry.  Neurological:     Mental Status: She is alert.  Psychiatric:        Mood and Affect: Mood normal.        Behavior: Behavior normal.        Thought Content: Thought content normal.     Imaging: CT ABDOMEN PELVIS W CONTRAST Result Date: 10/14/2023 CLINICAL DATA:  Ovarian cancer restaging, peritoneal carcinomatosis. * Tracking Code: BO * EXAM: CT ABDOMEN AND PELVIS WITH CONTRAST TECHNIQUE: Multidetector CT imaging of the abdomen and pelvis was performed using the standard protocol following bolus administration of intravenous contrast. RADIATION DOSE REDUCTION: This exam was performed according to the departmental dose-optimization program which includes automated exposure control, adjustment of the mA and/or kV according to patient size and/or use of iterative reconstruction technique. CONTRAST:  OMNIPAQUE  IOHEXOL  300 MG/ML  SOLN COMPARISON:  06/24/2023 FINDINGS: Lower chest: Stable scarring in both lower lobes. Bilateral breast implants noted. Hepatobiliary: Hepatic cirrhosis. Gallbladder unremarkable. No biliary dilatation. Pancreas: Unremarkable Spleen: Mild to moderate splenomegaly. Adrenals/Urinary Tract: Left mid kidney scarring with some left renal atrophy. Adrenal glands unremarkable. Stomach/Bowel: Unremarkable Vascular/Lymphatic: 2.6 by 2.3 by 2.5 cm suspected splenic artery aneurysm along the  splenic hilum and adjacent to the  tip of the pancreatic tail. This has been thought to represent accessory spleen in the past but the faint rim calcification and density matching that of the abdominal aorta rather than the spleen is thought to favor splenic artery aneurysm. This could be confirmed with CT angiography if clinically warranted. Mild aortoiliac atheromatous vascular calcifications. Small retroperitoneal pelvic lymph nodes are not pathologically enlarged by size criteria. Reproductive: Prior hysterectomy and bilateral salpingo oophorectomy. Left pelvic mass along the vaginal cuff/left adnexal region 3.8 by 3.5 cm on image 71 series 2, essentially stable. Other: Soft tissue nodule in the right abdominal mesentery in the upper anatomic pelvis 2.1 by 1.3 cm on image 51 series 2, stable by my measurements. Stable mild thickening along peritoneal surfaces. Musculoskeletal: Remote superior endplate compression at L3. Broad Schmorl's node or mild remote superior endplate compression at L4. Grade 1 retrolisthesis at L3-4 and grade 1 anterolisthesis at L4-5. Spondylosis and degenerative disc disease contribute to potential impingement at L3-4, L4-5, and L5-S1. IMPRESSION: 1. 2.6 cm in diameter suspected splenic artery aneurysm. Alternative possibilities of pancreatic tail mass or accessory splenic tissue considered less likely given the faint peripheral calcification and density matching that of the abdominal aorta. Interventional radiology consultation recommended. 2. Stable left pelvic mass along the vaginal cuff/left adnexal region. 3. Stable soft tissue nodule in the right abdominal mesentery in the upper anatomic pelvis. 4. Stable mild thickening along peritoneal surfaces. 5. Hepatic cirrhosis with mild to moderate splenomegaly. 6. Left renal atrophy with scarring. 7. Lumbar spondylosis and degenerative disc disease causing potential impingement at L3-4, L4-5, and L5-S1. 8.  Aortic Atherosclerosis (ICD10-I70.0). Electronically Signed   By:  Scheaffer Salvage M.D.   On: 10/14/2023 09:39    Labs:  CBC: Recent Labs    04/29/23 0739 06/23/23 0738 08/12/23 0818 10/07/23 0742  WBC 4.1 3.8* 3.4* 3.8*  HGB 12.4 12.5 12.0 12.5  HCT 37.0 37.1 35.7* 36.2  PLT 94* 86* 86* 89*    COAGS: No results for input(s): INR, APTT in the last 8760 hours.  BMP: Recent Labs    04/29/23 0739 06/23/23 0738 08/12/23 0818 10/07/23 0742  NA 141 141 142 141  K 3.8 4.1 3.7 3.7  CL 108 107 109 107  CO2 26 27 26 27   GLUCOSE 105* 100* 76 86  BUN 15 14 12 14   CALCIUM  8.4* 9.1 8.6* 8.6*  CREATININE 1.00 0.93 0.91 0.95  GFRNONAA 59* >60 >60 >60    LIVER FUNCTION TESTS: Recent Labs    04/29/23 0739 06/23/23 0738 08/12/23 0818 10/07/23 0742  BILITOT 0.6 0.7 0.7 0.7  AST 25 23 20 25   ALT 21 23 17 19   ALKPHOS 99 95 76 85  PROT 7.5 7.6 7.1 7.2  ALBUMIN  4.2 4.2 4.0 4.2    TUMOR MARKERS: No results for input(s): AFPTM, CEA, CA199, CHROMGRNA in the last 8760 hours.  Assessment and Plan:  Splenic artery aneurysm.  Plan for angiogram from a right common femoral approach under moderate sedation.  Embolization or stent placement to exclude the aneurysm from arterial flow.  The spleen may be embolized partially or completely to achieve success.    Electronically Signed: Cordella DELENA Banner, MD   11/05/2023, 5:05 PM     I spent a total of  40 Minutes  in face to face in clinical consultation, greater than 50% of which was counseling/coordinating care for splenic artery aneurysm.

## 2023-11-10 ENCOUNTER — Telehealth (HOSPITAL_COMMUNITY): Payer: Self-pay

## 2023-11-10 ENCOUNTER — Other Ambulatory Visit (HOSPITAL_COMMUNITY): Payer: Self-pay

## 2023-11-10 DIAGNOSIS — I728 Aneurysm of other specified arteries: Secondary | ICD-10-CM

## 2023-11-10 NOTE — Telephone Encounter (Signed)
 Called to schedule embo w/Dr. Jenna for 10/10, no answer, left vm. AB

## 2023-11-12 ENCOUNTER — Encounter (INDEPENDENT_AMBULATORY_CARE_PROVIDER_SITE_OTHER): Payer: Self-pay

## 2023-11-12 ENCOUNTER — Other Ambulatory Visit: Payer: Self-pay

## 2023-11-12 ENCOUNTER — Other Ambulatory Visit: Payer: Self-pay | Admitting: Pharmacy Technician

## 2023-11-12 NOTE — Progress Notes (Signed)
 Specialty Pharmacy Refill Coordination Note  Olivia Benson is a 78 y.o. female contacted today regarding refills of specialty medication(s) Niraparib  Tosylate (ZEJULA )   Patient requested (Patient-Rptd) Delivery   Delivery date: 11/20/23 Verified address: (Patient-Rptd) [address], [place], [place]. [address]   Medication will be filled on 11/19/23.

## 2023-11-19 ENCOUNTER — Other Ambulatory Visit: Payer: Self-pay | Admitting: Radiology

## 2023-11-19 ENCOUNTER — Other Ambulatory Visit: Payer: Self-pay

## 2023-11-19 DIAGNOSIS — I728 Aneurysm of other specified arteries: Secondary | ICD-10-CM

## 2023-11-20 ENCOUNTER — Other Ambulatory Visit: Payer: Self-pay

## 2023-11-20 NOTE — Consult Note (Incomplete)
 Chief Complaint: Patient was seen in consultation today for splenic artery aneurysm  Referring Physician(s): Dr. Estelita Manas  Supervising Physician: Jenna Hacker  Patient Status: [facility] - Out-pt  History of Present Illness: LENETTE RAU is a 78 y.o. 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  She is referred to Interventional Radiology for evaluation and possible treatment of her aneurysm.  Case reviewed by Dr. Jenna who has met with Ms. Desantis in consultation and discussed the merits, risks, and benefits of proceed with stent vs. Embolization of her aneurysm. After discussion she has elected to proceed and presents to [facility] for procedure today.   Ms. Stucki presents in her usual state of health. She has no abdominal pain, nausea, vomiting.   Past Medical History:  Diagnosis Date   Asthma    CHF (congestive heart failure) (HCC)    Cirrhosis (HCC)    Complication of anesthesia    slow to wake up / n & v   Diabetes mellitus    Elevated LFTs    Family history of adverse reaction to anesthesia    multiple family members have had problems such as slow to wake / flat lined /  ventilator     Frequency of urination    Glaucoma, narrow-angle    History of radiation therapy    Pelvis- 06/27/20-07/11/20 Dr. 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   my cancer is back and im restarting chemo on tuesday 09-25-2018]   Overactive bladder    PONV  (postoperative nausea and vomiting)    no problems with feb  egd    Portal hypertension (HCC)    Restless leg syndrome    Tingling    hands and feet    Past Surgical History:  Procedure Laterality Date   ABDOMINAL ULTRASOUND  Sept 2012   No gallbladder disease, + fatty liver   CARDIAC CATHETERIZATION  10/25/2010   EF 60%; Normal coronaries   COLONOSCOPY  10/18/2005   COLONOSCOPY WITH PROPOFOL  N/A 09/28/2018   Procedure: COLONOSCOPY WITH PROPOFOL ;  Surgeon: Manas Estelita, MD;  Location: [facility];  Service: Gastroenterology;  Laterality: N/A;   COSMETIC SURGERY  2003   DENTAL SURGERY     ESOPHAGEAL BANDING  02/10/2018   Procedure: ESOPHAGEAL BANDING;  Surgeon: Manas Estelita, MD;  Location: [facility];  Service: Gastroenterology;;   ESOPHAGEAL BANDING N/A 03/23/2018   Procedure: ESOPHAGEAL BANDING;  Surgeon: Manas Estelita, MD;  [facility];  Service: Gastroenterology;  Laterality: N/A;   ESOPHAGEAL BANDING N/A 09/28/2018   Procedure: ESOPHAGEAL BANDING;  Surgeon: Manas Estelita, MD;  [facility];  Service: Gastroenterology;  Laterality: N/A;   ESOPHAGEAL BANDING  03/01/2019   Procedure: ESOPHAGEAL BANDING;  Surgeon: Manas Estelita, MD;  [facility];  Service: Gastroenterology;;   ESOPHAGOGASTRODUODENOSCOPY N/A 03/23/2018   Procedure: ESOPHAGOGASTRODUODENOSCOPY (EGD);  Surgeon: Manas Estelita, MD;  Location: [facility];  Service: Gastroenterology;  Laterality: N/A;  ESOPHAGOGASTRODUODENOSCOPY N/A 09/17/2019   Procedure: ESOPHAGOGASTRODUODENOSCOPY (EGD);  Surgeon: Burnette Fallow, MD;  Location: [facility];  Service: Endoscopy;  Laterality: N/A;   ESOPHAGOGASTRODUODENOSCOPY (EGD) WITH PROPOFOL  N/A 02/10/2018   Procedure: ESOPHAGOGASTRODUODENOSCOPY (EGD) WITH PROPOFOL ;  Surgeon: Saintclair Jasper, MD;  [facility];  Service: Gastroenterology;  Laterality: N/A;   ESOPHAGOGASTRODUODENOSCOPY (EGD) WITH PROPOFOL  N/A 09/28/2018   Procedure: ESOPHAGOGASTRODUODENOSCOPY  (EGD) WITH PROPOFOL ;  Surgeon: Saintclair Jasper, MD;  [facility];  Service: Gastroenterology;  Laterality: N/A;   ESOPHAGOGASTRODUODENOSCOPY (EGD) WITH PROPOFOL  N/A 03/01/2019   Procedure: ESOPHAGOGASTRODUODENOSCOPY (EGD) WITH PROPOFOL  with banding;  Surgeon: Saintclair Jasper, MD;  [facility];  Service: Gastroenterology;  Laterality: N/A;   ESOPHAGOGASTRODUODENOSCOPY (EGD) WITH PROPOFOL  N/A 05/24/2019   Procedure: ESOPHAGOGASTRODUODENOSCOPY (EGD) WITH PROPOFOL ;  Surgeon: Saintclair Jasper, MD;  [facility];  Service: Gastroenterology;  Laterality: N/A;   ESOPHAGOGASTRODUODENOSCOPY (EGD) WITH PROPOFOL  N/A 06/13/2020   Procedure: ESOPHAGOGASTRODUODENOSCOPY (EGD) WITH PROPOFOL ;  Surgeon: Saintclair Jasper, MD;  [facility];  Service: Gastroenterology;  Laterality: N/A;   ESOPHAGOGASTRODUODENOSCOPY (EGD) WITH PROPOFOL  N/A 07/25/2021   Procedure: ESOPHAGOGASTRODUODENOSCOPY (EGD) WITH PROPOFOL ;  Surgeon: Saintclair Jasper, MD;  [facility];  Service: Gastroenterology;  Laterality: N/A;   EYE SURGERY Bilateral 09/19/14 R eye, 08/2014 L eye   open angles in both eyes d/t narrow angle glaucoma   GASTRIC VARICES BANDING N/A 05/24/2019   Procedure: GASTRIC VARICES BANDING;  Surgeon: Saintclair Jasper, MD;  [facility];  Service: Gastroenterology;  Laterality: N/A;   GASTRIC VARICES BANDING N/A 06/13/2020   Procedure: GASTRIC VARICES BANDING;  Surgeon: Saintclair Jasper, MD;  [facility];  Service: Gastroenterology;  Laterality: N/A;   LAPAROTOMY N/A 03/29/2014   Procedure: EXPLORATORY LAPAROTOMY;  Surgeon: Maurilio Ship, MD;  Location: [facility];  Service: Gynecology;  Laterality: N/A;   ORIF ANKLE FRACTURE Right 03/09/2018   Procedure: OPEN REDUCTION INTERNAL FIXATION (ORIF) RIGHT LATERAL MALLEOLUS;  Surgeon: Jerri Kay HERO, MD;  Location: [facility];  Service: Orthopedics;  Laterality: Right;   PARTIAL HYSTERECTOMY  1988   POLYPECTOMY  09/28/2018   Procedure: POLYPECTOMY;  Surgeon: Saintclair Jasper, MD;  [facility];  Service: Gastroenterology;;   SALPINGOOPHORECTOMY Bilateral 03/29/2014   Procedure: BILATERAL SALPINGO OOPHORECTOMY, OMENTECTOMY, DEBULKING;  Surgeon: Maurilio Ship, MD;  Location: [facility];  Service: Gynecology;  Laterality: Bilateral;   TONSILLECTOMY  1971   TUBAL LIGATION     WH-MAMMOGRAPHY  11/24/2009    Allergies: Carboplatin , Shellfish allergy, Vancomycin , Benadryl  [diphenhydramine  hcl], Penicillins, and Tape  Medications: Prior to Admission medications   Medication Sig Start Date End Date Taking? Authorizing Provider  calcium  elemental as carbonate (BARIATRIC TUMS ULTRA) 400 MG chewable tablet Chew 1 tablet by mouth 2 (two) times daily.    [provider]  Cholecalciferol  (VITAMIN D ) 50 MCG (2000 UT) tablet Take 2,000 Units by mouth at bedtime.    [provider]  gabapentin  (NEURONTIN ) 300 MG capsule TAKE 3 CAPSULES BY MOUTH TWICE DAILY 04/24/23   Lonn Hicks, MD  HYDROcodone -acetaminophen  (NORCO/VICODIN) 5-325 MG tablet Take 1 tablet by mouth every 6 (six) hours as needed for moderate pain (pain score 4-6). 10/14/23   Lonn Hicks, MD  insulin  glargine, 1 Unit Dial, (TOUJEO  SOLOSTAR) 300 UNIT/ML Solostar Pen Inject 30 Units into the skin daily.    [provider]  lidocaine -prilocaine  (EMLA ) cream Apply to Porta-cath  1-2 hours prior to access as directed. Patient taking differently: Apply 1 application  topically daily as needed (port access). 11/13/17   Lonn,  Ni, MD  niraparib  tosylate (ZEJULA ) 100 MG tablet Take 1 tablet (100 mg total) by mouth daily. 07/10/23   Lonn Hicks, MD  ondansetron  (ZOFRAN ) 8 MG tablet TAKE 1 TABLET(8 MG) BY MOUTH EVERY 8 HOURS AS NEEDED 01/20/23   Lonn, Ni, MD  OZEMPIC, 1 MG/DOSE, 4 MG/3ML SOPN Inject 1 mg into the skin once a week.    [provider]  pantoprazole  (PROTONIX ) 40 MG tablet Take 1 tablet (40 mg total) by mouth daily. 07/09/18   Lonn Hicks, MD  polyethylene glycol (MIRALAX  /  GLYCOLAX ) 17 g packet Take 17 g by mouth daily.    [provider]  rotigotine  (NEUPRO ) 4 MG/24HR APPLY 1 PATCH TOPICALLY TO THE SKIN DAILY 11/28/22   Tat, Asberry RAMAN, DO  senna (SENOKOT) 8.6 MG tablet Take 2 tablets by mouth 2 (two) times daily.    [provider]     Family History  Problem Relation Age of Onset   Asthma Mother    Diabetes Mother    Hypertension Mother    Allergies Mother    Rheum arthritis Mother    Bladder Cancer Father 32       former heavy smoker   Hypertension Father    Stroke Father    Breast cancer Sister 108   Diabetes Sister        x 3   Allergies Sister    Asthma Sister        x 2    Breast cancer Maternal Aunt 69   Breast cancer Cousin 63       (maternal) bilateral cancer   Aneurysm Son 34   Diabetes Brother    Allergies Brother    Asthma Brother     Social History   Socioeconomic History   Marital status: Married    Spouse name: Not on file   Number of children: 1 D   Years of education: Not on file   Highest education level: Not on file  Occupational History   Occupation: Retired  Tobacco Use   Smoking status: Former    Current packs/day: 0.00    Average packs/day: 1 pack/day for 1 year (1.0 ttl pk-yrs)    Types: Cigarettes    Start date: 02/11/1981    Quit date: 02/11/1982    Years since quitting: 41.8   Smokeless tobacco: Never  Vaping Use   Vaping status: Never Used  Substance and Sexual Activity   Alcohol use: No    Alcohol/week: 0.0 standard drinks of alcohol   Drug use: No   Sexual activity: Not on file  Other Topics Concern   Not on file  Social History Narrative   Left handed   Lives in single story home with husband   Social Drivers of Health   Financial Resource Strain: Low Risk  (11/08/2023)   Received from [facility]   Overall Financial Resource Strain (CARDIA)    How hard is it for you to pay for the very basics like food, housing, medical care, and heating?: Not hard at all  Food  Insecurity: No Food Insecurity (11/08/2023)   Received from [facility]   Hunger Vital Sign    Within the past 12 months, you worried that your food would run out before you got the money to buy more.: Never true    Within the past 12 months, the food you bought just didn't last and you didn't have money to get more.: Never true  Transportation Needs: No Transportation Needs (  11/08/2023)   Received from [facility] - Transportation    In the past 12 months, has lack of transportation kept you from medical appointments or from getting medications?: No    In the past 12 months, has lack of transportation kept you from meetings, work, or from getting things needed for daily living?: No  Physical Activity: Inactive (11/08/2023)   Received from [facility]   Exercise Vital Sign    On average, how many days per week do you engage in moderate to strenuous exercise (like a brisk walk)?: 0 days    Minutes of Exercise per Session: Not on file  Stress: No Stress Concern Present (11/08/2023)   Received from [facility] - Occupational Stress Questionnaire    Do you feel stress - tense, restless, nervous, or anxious, or unable to sleep at night because your mind is troubled all the time - these days?: Not at all  Social Connections: Socially Integrated (11/08/2023)   Received from [facility]   Social Network    How would you rate your social network (family, work, friends)?: Good participation with social networks     Review of Systems: A 12 point ROS discussed and pertinent positives are indicated in the HPI above.  All other systems are negative.  Review of Systems  Vital Signs: There were no vitals taken for this visit.  Physical Exam       Imaging: No results found.  Labs:  CBC: Recent Labs    04/29/23 0739 06/23/23 0738 08/12/23 0818 10/07/23 0742  WBC 4.1 3.8* 3.4* 3.8*  HGB 12.4 12.5 12.0 12.5  HCT 37.0 37.1  35.7* 36.2  PLT 94* 86* 86* 89*    COAGS: No results for input(s): INR, APTT in the last 8760 hours.  BMP: Recent Labs    04/29/23 0739 06/23/23 0738 08/12/23 0818 10/07/23 0742  NA 141 141 142 141  K 3.8 4.1 3.7 3.7  CL 108 107 109 107  CO2 26 27 26 27   GLUCOSE 105* 100* 76 86  BUN 15 14 12 14   CALCIUM  8.4* 9.1 8.6* 8.6*  CREATININE 1.00 0.93 0.91 0.95  GFRNONAA 59* >60 >60 >60    LIVER FUNCTION TESTS: Recent Labs    04/29/23 0739 06/23/23 0738 08/12/23 0818 10/07/23 0742  BILITOT 0.6 0.7 0.7 0.7  AST 25 23 20 25   ALT 21 23 17 19   ALKPHOS 99 95 76 85  PROT 7.5 7.6 7.1 7.2  ALBUMIN  4.2 4.2 4.0 4.2    TUMOR MARKERS: No results for input(s): AFPTM, CEA, CA199, CHROMGRNA in the last 8760 hours.  Assessment and Plan: Splenic artery aneurysm Patient with multiple comorbidities not limited to DM, HLD, HTN, CHF, elevated LFTs, ovarian cancer presents with complaint of splenic artery aneurysm.  IR consulted for evaluation and management at the request of Dr. Saintclair. Case reviewed by Dr. Jenna who has met with the patient in consultation and who approves patient for procedure.  Patient presents today in their usual state of health.  She has been NPO and is not currently on blood thinners.   The Risks and benefits of embolization were discussed with the patient including, but not limited to bleeding, infection, vascular injury, post operative pain, or contrast induced renal failure.  This procedure involves the use of X-rays and because of the nature of the planned procedure, it is possible that we will have prolonged use of X-ray fluoroscopy.  Potential  radiation risks to you include (but are not limited to) the following: - A slightly elevated risk for cancer several years later in life. This risk is typically less than 0.5% percent. This risk is low in comparison to the normal incidence of human cancer, which is 33% for women and 50% for men according  to the American Cancer Society. - Radiation induced injury can include skin redness, resembling a rash, tissue breakdown / ulcers and hair loss (which can be temporary or permanent).   The likelihood of either of these occurring depends on the difficulty of the procedure and whether you are sensitive to radiation due to previous procedures, disease, or genetic conditions.   IF your procedure requires a prolonged use of radiation, you will be notified and given written instructions for further action.  It is your responsibility to monitor the irradiated area for the 2 weeks following the procedure and to notify your physician if you are concerned that you have suffered a radiation induced injury.    All of the patient's questions were answered, patient is agreeable to proceed. Consent signed and in chart.  Thank you for this interesting consult.  I greatly enjoyed meeting RENISHA COCKRUM and look forward to participating in their care.  A copy of this report was sent to the requesting provider on this date.  Electronically Signed: Estoria Geary [name]-Ellen Dekendrick Uzelac, PA 11/20/2023, 2:31 PM   I spent a total of  30 Minutes   in face to face in clinical consultation, greater than 50% of which was counseling/coordinating care for splenic artery aneurysm.

## 2023-11-21 ENCOUNTER — Other Ambulatory Visit: Payer: Self-pay

## 2023-11-21 ENCOUNTER — Other Ambulatory Visit (HOSPITAL_COMMUNITY): Payer: Self-pay

## 2023-11-21 ENCOUNTER — Ambulatory Visit (HOSPITAL_COMMUNITY)
Admission: RE | Admit: 2023-11-21 | Discharge: 2023-11-21 | Disposition: A | Discharge status: Home / Self Care | Source: Ambulatory Visit

## 2023-11-21 DIAGNOSIS — Z7985 Long-term (current) use of injectable non-insulin antidiabetic drugs: Secondary | ICD-10-CM | POA: Diagnosis not present

## 2023-11-21 DIAGNOSIS — E119 Type 2 diabetes mellitus without complications: Secondary | ICD-10-CM | POA: Diagnosis not present

## 2023-11-21 DIAGNOSIS — I728 Aneurysm of other specified arteries: Secondary | ICD-10-CM | POA: Insufficient documentation

## 2023-11-21 DIAGNOSIS — I509 Heart failure, unspecified: Secondary | ICD-10-CM | POA: Insufficient documentation

## 2023-11-21 DIAGNOSIS — E785 Hyperlipidemia, unspecified: Secondary | ICD-10-CM | POA: Diagnosis not present

## 2023-11-21 DIAGNOSIS — Z87891 Personal history of nicotine dependence: Secondary | ICD-10-CM | POA: Insufficient documentation

## 2023-11-21 DIAGNOSIS — I110 Hypertensive heart disease with heart failure: Secondary | ICD-10-CM | POA: Diagnosis not present

## 2023-11-21 DIAGNOSIS — Z794 Long term (current) use of insulin: Secondary | ICD-10-CM | POA: Diagnosis not present

## 2023-11-21 HISTORY — PX: IR ANGIOGRAM VISCERAL SELECTIVE: IMG657

## 2023-11-21 HISTORY — PX: IR ANGIOGRAM SELECTIVE EACH ADDITIONAL VESSEL: IMG667

## 2023-11-21 HISTORY — PX: IR US GUIDE VASC ACCESS RIGHT: IMG2390

## 2023-11-21 HISTORY — PX: IR EMBO ARTERIAL NOT HEMORR HEMANG INC GUIDE ROADMAPPING: IMG5448

## 2023-11-21 LAB — BASIC METABOLIC PANEL WITH GFR
Anion gap: 14 (ref 5–15)
BUN: 12 mg/dL (ref 8–23)
CO2: 26 mmol/L (ref 22–32)
Calcium: 8.7 mg/dL — ABNORMAL LOW (ref 8.9–10.3)
Chloride: 102 mmol/L (ref 98–111)
Creatinine, Ser: 0.94 mg/dL (ref 0.44–1.00)
GFR, Estimated: >60 mL/min (ref >60)
Glucose, Bld: 79 mg/dL (ref 70–99)
Potassium: 3.8 mmol/L (ref 3.5–5.1)
Sodium: 142 mmol/L (ref 135–145)

## 2023-11-21 LAB — GLUCOSE, CAPILLARY
Glucose-Capillary: 64 mg/dL — ABNORMAL LOW (ref 70–99)
Glucose-Capillary: 153 mg/dL — ABNORMAL HIGH (ref 70–99)
Glucose-Capillary: 70 mg/dL (ref 70–99)
Glucose-Capillary: 116 mg/dL — ABNORMAL HIGH (ref 70–99)

## 2023-11-21 LAB — CBC
HCT: 38.8 % (ref 36.0–46.0)
Hemoglobin: 13 g/dL (ref 12.0–15.0)
MCH: 32.3 pg (ref 26.0–34.0)
MCHC: 33.5 g/dL (ref 30.0–36.0)
MCV: 96.3 fL (ref 80.0–100.0)
Platelets: 107 K/uL — ABNORMAL LOW (ref 150–400)
RBC: 4.03 MIL/uL (ref 3.87–5.11)
RDW: 14.1 % (ref 11.5–15.5)
WBC: 3.9 K/uL — ABNORMAL LOW (ref 4.0–10.5)
nRBC: 0 % (ref 0.0–0.2)

## 2023-11-21 LAB — PROTIME-INR
INR: 1.1 (ref 0.8–1.2)
Prothrombin Time: 15.1 s (ref 11.4–15.2)

## 2023-11-21 MED ORDER — IOHEXOL 300 MG/ML  SOLN
150.0000 mL | Freq: Once | INTRAMUSCULAR | Status: AC | PRN
  Administered 2023-11-21: 100 mL via INTRA_ARTERIAL

## 2023-11-21 MED ORDER — FENTANYL CITRATE (PF) 100 MCG/2ML IJ SOLN
INTRAMUSCULAR | Status: AC
  Filled 2023-11-21: qty 2

## 2023-11-21 MED ORDER — LIDOCAINE-EPINEPHRINE 1 %-1:100000 IJ SOLN
INTRAMUSCULAR | Status: AC
  Filled 2023-11-21: qty 1

## 2023-11-21 MED ORDER — SODIUM CHLORIDE 0.9 % IV SOLN: INTRAVENOUS | Status: DC

## 2023-11-21 MED ORDER — DEXTROSE 50 % IV SOLN
12.5000 g | INTRAVENOUS | Status: AC
  Administered 2023-11-21: 12.5 g via INTRAVENOUS

## 2023-11-21 MED ORDER — DEXTROSE 50 % IV SOLN
INTRAVENOUS | Status: AC
  Administered 2023-11-21: 25 mL
  Filled 2023-11-21: qty 50

## 2023-11-21 MED ORDER — MIDAZOLAM HCL 2 MG/2ML IJ SOLN
INTRAMUSCULAR | Status: AC | PRN
  Administered 2023-11-21 (×2): .5 mg via INTRAVENOUS
  Administered 2023-11-21: 1 mg via INTRAVENOUS
  Administered 2023-11-21 (×4): .5 mg via INTRAVENOUS

## 2023-11-21 MED ORDER — HEPARIN SODIUM (PORCINE) 1000 UNIT/ML IJ SOLN
INTRAMUSCULAR | Status: AC
  Filled 2023-11-21: qty 10

## 2023-11-21 MED ORDER — MIDAZOLAM HCL 2 MG/2ML IJ SOLN
INTRAMUSCULAR | Status: AC
  Filled 2023-11-21: qty 2

## 2023-11-21 MED ORDER — ONDANSETRON HCL 4 MG/2ML IJ SOLN
INTRAMUSCULAR | Status: AC | PRN
  Administered 2023-11-21: 4 mg via INTRAVENOUS

## 2023-11-21 MED ORDER — FENTANYL CITRATE (PF) 100 MCG/2ML IJ SOLN
INTRAMUSCULAR | Status: AC | PRN
  Administered 2023-11-21 (×3): 25 ug via INTRAVENOUS
  Administered 2023-11-21: 50 ug via INTRAVENOUS
  Administered 2023-11-21: 25 ug via INTRAVENOUS

## 2023-11-21 MED ORDER — DEXTROSE 50 % IV SOLN
INTRAVENOUS | Status: AC
  Filled 2023-11-21: qty 50

## 2023-11-21 MED ORDER — DEXTROSE 50 % IV SOLN: 12.5000 g | INTRAVENOUS | Status: DC

## 2023-11-21 MED ORDER — ONDANSETRON HCL 4 MG/2ML IJ SOLN
INTRAMUSCULAR | Status: AC
  Filled 2023-11-21: qty 2

## 2023-11-21 NOTE — Discharge Instructions (Signed)
 Femoral Site Care This sheet gives you information about how to care for yourself after your procedure. Your health care provider may also give you more specific instructions. If you have problems or questions, contact your health care provider. What can I expect after the procedure?  After the procedure, it is common to have: Bruising that usually fades within 1-2 weeks. Tenderness at the site. Follow these instructions at home: Wound care Follow instructions from your health care provider about how to take care of your insertion site. Make sure you: Wash your hands with soap and water before you change your bandage (dressing). If soap and water are not available, use hand sanitizer. Remove your dressing as told by your health care provider. In 24 hours Do not take baths, swim, or use a hot tub until your health care provider approves. You may shower 24-48 hours after the procedure or as told by your health care provider. Gently wash the site with plain soap and water. Pat the area dry with a clean towel. Do not rub the site. This may cause bleeding. Do not apply powder or lotion to the site. Keep the site clean and dry. Check your femoral site every day for signs of infection. Check for: Redness, swelling, or pain. Fluid or blood. Warmth. Pus or a bad smell. Activity For the first 2-3 days after your procedure, or as long as directed: Avoid climbing stairs as much as possible. Do not squat. Do not lift anything that is heavier than 10 lb (4.5 kg), or the limit that you are told, until your health care provider says that it is safe. For 5 days Rest as directed. Avoid sitting for a long time without moving. Get up to take short walks every 1-2 hours. Do not drive for 24 hours if you were given a medicine to help you relax (sedative). General instructions Take over-the-counter and prescription medicines only as told by your health care provider. Keep all follow-up visits as told by  your health care provider. This is important. Contact a health care provider if you have: A fever or chills. You have redness, swelling, or pain around your insertion site. Get help right away if: The catheter insertion area swells very fast. You pass out. You suddenly start to sweat or your skin gets clammy. The catheter insertion area is bleeding, and the bleeding does not stop when you hold steady pressure on the area. The area near or just beyond the catheter insertion site becomes pale, cool, tingly, or numb. These symptoms may represent a serious problem that is an emergency. Do not wait to see if the symptoms will go away. Get medical help right away. Call your local emergency services (911 in the [place] Do not drive yourself to the hospital. Summary After the procedure, it is common to have bruising that usually fades within 1-2 weeks. Check your femoral site every day for signs of infection. Do not lift anything that is heavier than 10 lb (4.5 kg), or the limit that you are told, until your health care provider says that it is safe. This information is not intended to replace advice given to you by your health care provider. Make sure you discuss any questions you have with your health care provider. Document Revised: 02/10/2017 Document Reviewed: 02/10/2017 Elsevier Patient Education  2020 [employer].

## 2023-11-21 NOTE — Progress Notes (Signed)
 Discharge instructions reviewed with patient and husband at the bedside. Denies questions or concerns. PT ambulated in the hallway. Voided in the bathroom without difficulty. No s/s of complications at incision site. Denies pain. Tolerated PO intake. Was escorted from the unit via wheelchair to personal vehicle.

## 2023-11-21 NOTE — Procedures (Signed)
 Interventional Radiology Procedure Note  Procedure: splenic artery aneurysm embolization  Complications: None  Estimated Blood Loss: < 10 mL  Findings: RCFA access, splenic artery angiogram, aneurysm catheterization /embolization, splenic artery angiogram.  Angioseal closure.  Olivia Benson Banner, MD

## 2023-11-26 ENCOUNTER — Encounter: Payer: Self-pay | Admitting: Family Medicine

## 2023-11-26 ENCOUNTER — Ambulatory Visit (INDEPENDENT_AMBULATORY_CARE_PROVIDER_SITE_OTHER): Admitting: Family Medicine

## 2023-11-26 VITALS — BP 138/85 | HR 72 | Ht 61.5 in | Wt 154.2 lb

## 2023-11-26 DIAGNOSIS — E1140 Type 2 diabetes mellitus with diabetic neuropathy, unspecified: Secondary | ICD-10-CM | POA: Diagnosis not present

## 2023-11-26 DIAGNOSIS — C482 Malignant neoplasm of peritoneum, unspecified: Secondary | ICD-10-CM | POA: Diagnosis not present

## 2023-11-26 DIAGNOSIS — Z7689 Persons encountering health services in other specified circumstances: Secondary | ICD-10-CM

## 2023-11-26 DIAGNOSIS — Z794 Long term (current) use of insulin: Secondary | ICD-10-CM | POA: Diagnosis not present

## 2023-11-26 DIAGNOSIS — I10 Essential (primary) hypertension: Secondary | ICD-10-CM | POA: Diagnosis not present

## 2023-11-26 DIAGNOSIS — Z23 Encounter for immunization: Secondary | ICD-10-CM

## 2023-11-26 NOTE — Progress Notes (Unsigned)
 New Patient Office Visit  Subjective    Patient ID: ILO BEAMON, female    DOB: 04-03-1945  Age: 78 y.o. MRN: [id number]  CC:  Chief Complaint  Patient presents with   Establish Care    HPI Olivia Benson presents to establish care and for review of chronic med issues including hypertension and diabetes. She also has been undergoing treatment for peritoneal cancer.    Outpatient Encounter Medications as of 11/26/2023  Medication Sig   calcium  elemental as carbonate (BARIATRIC TUMS ULTRA) 400 MG chewable tablet Chew 1 tablet by mouth 2 (two) times daily.   Cholecalciferol  (VITAMIN D ) 50 MCG (2000 UT) tablet Take 2,000 Units by mouth at bedtime.   gabapentin  (NEURONTIN ) 300 MG capsule TAKE 3 CAPSULES BY MOUTH TWICE DAILY   HYDROcodone -acetaminophen  (NORCO/VICODIN) 5-325 MG tablet Take 1 tablet by mouth every 6 (six) hours as needed for moderate pain (pain score 4-6).   insulin  glargine, 1 Unit Dial, (TOUJEO  SOLOSTAR) 300 UNIT/ML Solostar Pen Inject 30 Units into the skin daily.   niraparib  tosylate (ZEJULA ) 100 MG tablet Take 1 tablet (100 mg total) by mouth daily.   ondansetron  (ZOFRAN ) 8 MG tablet TAKE 1 TABLET(8 MG) BY MOUTH EVERY 8 HOURS AS NEEDED   OZEMPIC, 1 MG/DOSE, 4 MG/3ML SOPN Inject 1 mg into the skin once a week.   pantoprazole  (PROTONIX ) 40 MG tablet Take 1 tablet (40 mg total) by mouth daily.   rotigotine  (NEUPRO ) 4 MG/24HR APPLY 1 PATCH TOPICALLY TO THE SKIN DAILY   senna (SENOKOT) 8.6 MG tablet Take 2 tablets by mouth 2 (two) times daily.   lidocaine -prilocaine  (EMLA ) cream Apply to Porta-cath  1-2 hours prior to access as directed. (Patient taking differently: Apply 1 application  topically daily as needed (port access).)   polyethylene glycol (MIRALAX  / GLYCOLAX ) 17 g packet Take 17 g by mouth daily. (Patient not taking: Reported on 11/26/2023)   No facility-administered encounter medications on file as of 11/26/2023.    Past Medical History:  Diagnosis Date    Asthma    CHF (congestive heart failure) (HCC)    Cirrhosis (HCC)    Complication of anesthesia    slow to wake up / n & v   Diabetes mellitus    Elevated LFTs    Family history of adverse reaction to anesthesia    multiple family members have had problems such as slow to wake / flat lined /  ventilator     Frequency of urination    Glaucoma, narrow-angle    History of radiation therapy    Pelvis- 06/27/20-07/11/20 Dr. 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   my cancer is back and im restarting chemo on tuesday 09-25-2018]   Overactive bladder    PONV (postoperative nausea and vomiting)    no problems with feb  egd    Portal hypertension (HCC)    Restless leg syndrome    Tingling    hands and feet    Past Surgical History:  Procedure Laterality Date   ABDOMINAL ULTRASOUND  Sept 2012   No gallbladder disease, + fatty liver  CARDIAC CATHETERIZATION  10/25/2010   EF 60%; Normal coronaries   COLONOSCOPY  10/18/2005   COLONOSCOPY WITH PROPOFOL  N/A 09/28/2018   Procedure: COLONOSCOPY WITH PROPOFOL ;  Surgeon: Saintclair Jasper, MD;  Location: [facility];  Service: Gastroenterology;  Laterality: N/A;   COSMETIC SURGERY  2003   DENTAL SURGERY     ESOPHAGEAL BANDING  02/10/2018   Procedure: ESOPHAGEAL BANDING;  Surgeon: Saintclair Jasper, MD;  Location: [facility];  Service: Gastroenterology;;   ESOPHAGEAL BANDING N/A 03/23/2018   Procedure: ESOPHAGEAL BANDING;  Surgeon: Saintclair Jasper, MD;  Location: [facility];  Service: Gastroenterology;  Laterality: N/A;   ESOPHAGEAL BANDING N/A 09/28/2018   Procedure: ESOPHAGEAL BANDING;  Surgeon: Saintclair Jasper, MD;  [facility];  Service: Gastroenterology;  Laterality: N/A;   ESOPHAGEAL BANDING  03/01/2019   Procedure: ESOPHAGEAL BANDING;  Surgeon: Saintclair Jasper, MD;  Location: [facility];  Service: Gastroenterology;;   ESOPHAGOGASTRODUODENOSCOPY N/A 03/23/2018   Procedure: ESOPHAGOGASTRODUODENOSCOPY (EGD);  Surgeon: Saintclair Jasper, MD;  Location: [facility];  Service: Gastroenterology;  Laterality: N/A;   ESOPHAGOGASTRODUODENOSCOPY N/A 09/17/2019   Procedure: ESOPHAGOGASTRODUODENOSCOPY (EGD);  Surgeon: Burnette Fallow, MD;  Location: [facility];  Service: Endoscopy;  Laterality: N/A;   ESOPHAGOGASTRODUODENOSCOPY (EGD) WITH PROPOFOL  N/A 02/10/2018   Procedure: ESOPHAGOGASTRODUODENOSCOPY (EGD) WITH PROPOFOL ;  Surgeon: Saintclair Jasper, MD;  [facility]: [facility];  Service: Gastroenterology;  Laterality: N/A;   ESOPHAGOGASTRODUODENOSCOPY (EGD) WITH PROPOFOL  N/A 09/28/2018   Procedure: ESOPHAGOGASTRODUODENOSCOPY (EGD) WITH PROPOFOL ;  Surgeon: Saintclair Jasper, MD;  [facility];  Service: Gastroenterology;  Laterality: N/A;   ESOPHAGOGASTRODUODENOSCOPY (EGD) WITH PROPOFOL  N/A 03/01/2019   Procedure: ESOPHAGOGASTRODUODENOSCOPY (EGD) WITH PROPOFOL  with banding;  Surgeon: Saintclair Jasper, MD;  [facility]: [facility];  Service: Gastroenterology;  Laterality: N/A;   ESOPHAGOGASTRODUODENOSCOPY (EGD) WITH PROPOFOL  N/A 05/24/2019   Procedure: ESOPHAGOGASTRODUODENOSCOPY (EGD) WITH PROPOFOL ;  Surgeon: Saintclair Jasper, MD;  [facility];  Service: Gastroenterology;  Laterality: N/A;   ESOPHAGOGASTRODUODENOSCOPY (EGD) WITH PROPOFOL  N/A 06/13/2020   Procedure: ESOPHAGOGASTRODUODENOSCOPY (EGD) WITH PROPOFOL ;  Surgeon: Saintclair Jasper, MD;  [facility];  Service: Gastroenterology;  Laterality: N/A;   ESOPHAGOGASTRODUODENOSCOPY (EGD) WITH PROPOFOL  N/A 07/25/2021   Procedure: ESOPHAGOGASTRODUODENOSCOPY (EGD) WITH PROPOFOL ;  Surgeon: Saintclair Jasper, MD;  [facility]: [facility];  Service: Gastroenterology;  Laterality: N/A;   EYE  SURGERY Bilateral 09/19/14 R eye, 08/2014 L eye   open angles in both eyes d/t narrow angle glaucoma   GASTRIC VARICES BANDING N/A 05/24/2019   Procedure: GASTRIC VARICES BANDING;  Surgeon: Saintclair Jasper, MD;  Location: [facility];  Service: Gastroenterology;  Laterality: N/A;   GASTRIC VARICES BANDING N/A 06/13/2020   Procedure: GASTRIC VARICES BANDING;  Surgeon: Saintclair Jasper, MD;  [facility]: [facility];  Service: Gastroenterology;  Laterality: N/A;   IR ANGIOGRAM SELECTIVE EACH ADDITIONAL VESSEL  11/21/2023   IR ANGIOGRAM VISCERAL SELECTIVE  11/21/2023   IR EMBO ARTERIAL NOT HEMORR HEMANG INC GUIDE ROADMAPPING  11/21/2023   IR RADIOLOGIST EVAL & MGMT  11/05/2023   IR US  GUIDE VASC ACCESS RIGHT  11/21/2023   LAPAROTOMY N/A 03/29/2014   Procedure: EXPLORATORY LAPAROTOMY;  Surgeon: Maurilio Ship, MD;  Location: [facility];  Service: Gynecology;  Laterality: N/A;   ORIF ANKLE FRACTURE Right 03/09/2018   Procedure: OPEN REDUCTION INTERNAL FIXATION (ORIF) RIGHT LATERAL MALLEOLUS;  Surgeon: Jerri Kay HERO, MD;  Location: [facility];  Service: Orthopedics;  Laterality: Right;   PARTIAL HYSTERECTOMY  1988   POLYPECTOMY  09/28/2018   Procedure: POLYPECTOMY;  Surgeon: Saintclair Jasper, MD;  Location: [facility];  Service:  Gastroenterology;;   SALPINGOOPHORECTOMY Bilateral 03/29/2014   Procedure: BILATERAL SALPINGO OOPHORECTOMY, OMENTECTOMY, DEBULKING;  Surgeon: Maurilio Ship, MD;  Location: [facility];  Service: Gynecology;  Laterality: Bilateral;   TONSILLECTOMY  1971   TUBAL LIGATION     WH-MAMMOGRAPHY  11/24/2009    Family History  Problem Relation Age of Onset   Asthma Mother    Diabetes Mother    Hypertension Mother    Allergies Mother    Rheum arthritis Mother    Bladder Cancer Father 28       former heavy smoker   Hypertension Father    Stroke Father    Breast cancer Sister 7   Diabetes Sister        x 3   Allergies Sister    Asthma Sister        x 2    Breast cancer Maternal Aunt 11   Breast cancer Cousin  65       (maternal) bilateral cancer   Aneurysm Son 66   Diabetes Brother    Allergies Brother    Asthma Brother     Social History   Socioeconomic History   Marital status: Married    Spouse name: Not on file   Number of children: 1 D   Years of education: Not on file   Highest education level: Associate degree: occupational, Scientist, product/process development, or vocational program  Occupational History   Occupation: Retired  Tobacco Use   Smoking status: Former    Current packs/day: 0.00    Average packs/day: 1 pack/day for 1 year (1.0 ttl pk-yrs)    Types: Cigarettes    Start date: 02/11/1981    Quit date: 02/11/1982    Years since quitting: 41.8   Smokeless tobacco: Never  Vaping Use   Vaping status: Never Used  Substance and Sexual Activity   Alcohol use: No    Alcohol/week: 0.0 standard drinks of alcohol   Drug use: No   Sexual activity: Not on file  Other Topics Concern   Not on file  Social History Narrative   Left handed   Lives in single story home with husband   Social Drivers of Health   Financial Resource Strain: Low Risk  (11/22/2023)   Overall Financial Resource Strain (CARDIA)    Difficulty of Paying Living Expenses: Not hard at all  Food Insecurity: No Food Insecurity (11/22/2023)   Hunger Vital Sign    Worried About Running Out of Food in the Last Year: Never true    Ran Out of Food in the Last Year: Never true  Transportation Needs: No Transportation Needs (11/22/2023)   PRAPARE - Administrator, Civil Service (Medical): No    Lack of Transportation (Non-Medical): No  Physical Activity: Inactive (11/22/2023)   Exercise Vital Sign    Days of Exercise per Week: 0 days    Minutes of Exercise per Session: Not on file  Stress: No Stress Concern Present (11/22/2023)   [employer] of Occupational Health - Occupational Stress Questionnaire    Feeling of Stress: Not at all  Social Connections: Moderately Integrated (11/22/2023)   Social Connection and  Isolation Panel    Frequency of Communication with Friends and Family: More than three times a week    Frequency of Social Gatherings with Friends and Family: More than three times a week    Attends Religious Services: More than 4 times per year    Active Member of [employer] or Organizations: No    Attends [employer]  or Organization Meetings: Not on file    Marital Status: Married  Intimate Partner Violence: Not At Risk (11/08/2023)   Received from [facility]   HITS    Over the last 12 months how often did your partner physically hurt you?: Never    Over the last 12 months how often did your partner insult you or talk down to you?: Never    Over the last 12 months how often did your partner threaten you with physical harm?: Never    Over the last 12 months how often did your partner scream or curse at you?: Never    Review of Systems  All other systems reviewed and are negative.       Objective   BP 138/85   Pulse 72   Ht 5' 1.5 (1.562 m)   Wt 154 lb 3.2 oz (69.9 kg)   SpO2 97%   BMI 28.66 kg/m   Physical Exam Vitals and nursing note reviewed.  Constitutional:      General: She is not in acute distress. Cardiovascular:     Rate and Rhythm: Normal rate and regular rhythm.  Pulmonary:     Effort: Pulmonary effort is normal.     Breath sounds: Normal breath sounds.  Abdominal:     Palpations: Abdomen is soft.     Tenderness: There is no abdominal tenderness.  Neurological:     General: No focal deficit present.     Mental Status: She is alert and oriented to person, place, and time.         Assessment & Plan:   Essential hypertension  Type 2 diabetes mellitus with diabetic neuropathy, with long-term current use of insulin  (HCC)  Primary peritoneal carcinomatosis (HCC)  Encounter for immunization -     Flu vaccine HIGH DOSE PF(Fluzone Trivalent)  Encounter to establish care   Patient's chronic med issues appear stable and are being managed by consultants at  this time. Continue.   Return in about 1 year (around 11/25/2024) for follow up.   Tanda Raguel SQUIBB, MD

## 2023-11-26 NOTE — Progress Notes (Deleted)
 Established Patient Office Visit  Subjective    Patient ID: Olivia Benson, female    DOB: [birth year]  Age: 78 y.o. MRN: [id number]  CC:  Chief Complaint  Patient presents with   Establish Care    HPI Olivia Benson presents ***  Outpatient Encounter Medications as of 11/26/2023  Medication Sig   calcium  elemental as carbonate (BARIATRIC TUMS ULTRA) 400 MG chewable tablet Chew 1 tablet by mouth 2 (two) times daily.   Cholecalciferol  (VITAMIN D ) 50 MCG (2000 UT) tablet Take 2,000 Units by mouth at bedtime.   gabapentin  (NEURONTIN ) 300 MG capsule TAKE 3 CAPSULES BY MOUTH TWICE DAILY   HYDROcodone -acetaminophen  (NORCO/VICODIN) 5-325 MG tablet Take 1 tablet by mouth every 6 (six) hours as needed for moderate pain (pain score 4-6).   insulin  glargine, 1 Unit Dial, (TOUJEO  SOLOSTAR) 300 UNIT/ML Solostar Pen Inject 30 Units into the skin daily.   niraparib  tosylate (ZEJULA ) 100 MG tablet Take 1 tablet (100 mg total) by mouth daily.   ondansetron  (ZOFRAN ) 8 MG tablet TAKE 1 TABLET(8 MG) BY MOUTH EVERY 8 HOURS AS NEEDED   OZEMPIC, 1 MG/DOSE, 4 MG/3ML SOPN Inject 1 mg into the skin once a week.   pantoprazole  (PROTONIX ) 40 MG tablet Take 1 tablet (40 mg total) by mouth daily.   rotigotine  (NEUPRO ) 4 MG/24HR APPLY 1 PATCH TOPICALLY TO THE SKIN DAILY   senna (SENOKOT) 8.6 MG tablet Take 2 tablets by mouth 2 (two) times daily.   lidocaine -prilocaine  (EMLA ) cream Apply to Porta-cath  1-2 hours prior to access as directed. (Patient taking differently: Apply 1 application  topically daily as needed (port access).)   polyethylene glycol (MIRALAX  / GLYCOLAX ) 17 g packet Take 17 g by mouth daily. (Patient not taking: Reported on 11/26/2023)   No facility-administered encounter medications on file as of 11/26/2023.    Past Medical History:  Diagnosis Date   Asthma    CHF (congestive heart failure) (HCC)    Cirrhosis (HCC)    Complication of anesthesia    slow to wake up / n & v   Diabetes  mellitus    Elevated LFTs    Family history of adverse reaction to anesthesia    multiple family members have had problems such as slow to wake / flat lined /  ventilator     Frequency of urination    Glaucoma, narrow-angle    History of radiation therapy    Pelvis- 06/27/20-07/11/20 Dr. 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   my cancer is back and im restarting chemo on tuesday 09-25-2018]   Overactive bladder    PONV (postoperative nausea and vomiting)    no problems with feb  egd    Portal hypertension (HCC)    Restless leg syndrome    Tingling    hands and feet    Past Surgical History:  Procedure Laterality Date   ABDOMINAL ULTRASOUND  Sept 2012   No gallbladder disease, + fatty liver   CARDIAC CATHETERIZATION  10/25/2010   EF 60%; Normal coronaries   COLONOSCOPY  10/18/2005   COLONOSCOPY WITH PROPOFOL  N/A 09/28/2018  Procedure: COLONOSCOPY WITH PROPOFOL ;  Surgeon: Saintclair Jasper, MD;  Location: [facility];  Service: Gastroenterology;  Laterality: N/A;   COSMETIC SURGERY  2003   DENTAL SURGERY     ESOPHAGEAL BANDING  02/10/2018   Procedure: ESOPHAGEAL BANDING;  Surgeon: Saintclair Jasper, MD;  Location: [facility];  Service: Gastroenterology;;   ESOPHAGEAL BANDING N/A 03/23/2018   Procedure: ESOPHAGEAL BANDING;  Surgeon: Saintclair Jasper, MD;  Location: [facility];  Service: Gastroenterology;  Laterality: N/A;   ESOPHAGEAL BANDING N/A 09/28/2018   Procedure: ESOPHAGEAL BANDING;  Surgeon: Saintclair Jasper, MD;  Location: [facility];  Service: Gastroenterology;  Laterality: N/A;   ESOPHAGEAL BANDING  03/01/2019   Procedure: ESOPHAGEAL BANDING;  Surgeon: Saintclair Jasper, MD;  Location[facility];  Service: Gastroenterology;;   ESOPHAGOGASTRODUODENOSCOPY N/A 03/23/2018   Procedure: ESOPHAGOGASTRODUODENOSCOPY (EGD);  Surgeon: Saintclair Jasper, MD;  Location: [facility];  Service: Gastroenterology;  Laterality: N/A;   ESOPHAGOGASTRODUODENOSCOPY N/A 09/17/2019   Procedure: ESOPHAGOGASTRODUODENOSCOPY (EGD);  Surgeon: Burnette Fallow, MD;  Location: [facility];  Service: Endoscopy;  Laterality: N/A;   ESOPHAGOGASTRODUODENOSCOPY (EGD) WITH PROPOFOL  N/A 02/10/2018   Procedure: ESOPHAGOGASTRODUODENOSCOPY (EGD) WITH PROPOFOL ;  Surgeon: Saintclair Jasper, MD;  [facility]: [facility];  Service: Gastroenterology;  Laterality: N/A;   ESOPHAGOGASTRODUODENOSCOPY (EGD) WITH PROPOFOL  N/A 09/28/2018   Procedure: ESOPHAGOGASTRODUODENOSCOPY (EGD) WITH PROPOFOL ;  Surgeon: Saintclair Jasper, MD;  Location: [facility];  Service: Gastroenterology;  Laterality: N/A;   ESOPHAGOGASTRODUODENOSCOPY (EGD) WITH PROPOFOL  N/A 03/01/2019   Procedure: ESOPHAGOGASTRODUODENOSCOPY (EGD) WITH PROPOFOL  with banding;  Surgeon: Saintclair Jasper, MD;  Location: [facility];  Service: Gastroenterology;  Laterality: N/A;   ESOPHAGOGASTRODUODENOSCOPY (EGD) WITH PROPOFOL  N/A 05/24/2019   Procedure: ESOPHAGOGASTRODUODENOSCOPY (EGD) WITH PROPOFOL ;  Surgeon: Saintclair Jasper, MD;  Location: [facility];  Service: Gastroenterology;  Laterality: N/A;   ESOPHAGOGASTRODUODENOSCOPY (EGD) WITH PROPOFOL  N/A 06/13/2020   Procedure: ESOPHAGOGASTRODUODENOSCOPY (EGD) WITH PROPOFOL ;  Surgeon: Saintclair Jasper, MD;  [facility];  Service: Gastroenterology;  Laterality: N/A;   ESOPHAGOGASTRODUODENOSCOPY (EGD) WITH PROPOFOL  N/A 07/25/2021   Procedure: ESOPHAGOGASTRODUODENOSCOPY (EGD) WITH PROPOFOL ;  Surgeon: Saintclair Jasper, MD;  Location: [facility];  Service: Gastroenterology;  Laterality: N/A;   EYE SURGERY Bilateral 09/19/14 R eye, 08/2014 L eye   open angles in both eyes d/t narrow angle glaucoma   GASTRIC VARICES BANDING N/A 05/24/2019    Procedure: GASTRIC VARICES BANDING;  Surgeon: Saintclair Jasper, MD;  Location: [facility];  Service: Gastroenterology;  Laterality: N/A;   GASTRIC VARICES BANDING N/A 06/13/2020   Procedure: GASTRIC VARICES BANDING;  Surgeon: Saintclair Jasper, MD;  Location: [facility];  Service: Gastroenterology;  Laterality: N/A;   IR ANGIOGRAM SELECTIVE EACH ADDITIONAL VESSEL  11/21/2023   IR ANGIOGRAM VISCERAL SELECTIVE  11/21/2023   IR EMBO ARTERIAL NOT HEMORR HEMANG INC GUIDE ROADMAPPING  11/21/2023   IR RADIOLOGIST EVAL & MGMT  11/05/2023   IR US  GUIDE VASC ACCESS RIGHT  11/21/2023   LAPAROTOMY N/A 03/29/2014   Procedure: EXPLORATORY LAPAROTOMY;  Surgeon: Maurilio Ship, MD;  Location: [facility];  Service: Gynecology;  Laterality: N/A;   ORIF ANKLE FRACTURE Right 03/09/2018   Procedure: OPEN REDUCTION INTERNAL FIXATION (ORIF) RIGHT LATERAL MALLEOLUS;  Surgeon: Jerri Kay HERO, MD;  Location: [facility];  Service: Orthopedics;  Laterality: Right;   PARTIAL HYSTERECTOMY  1988   POLYPECTOMY  09/28/2018   Procedure: POLYPECTOMY;  Surgeon: Saintclair Jasper, MD;  Location: [facility];  Service: Gastroenterology;;   SALPINGOOPHORECTOMY Bilateral 03/29/2014   Procedure: BILATERAL SALPINGO OOPHORECTOMY, OMENTECTOMY, DEBULKING;  Surgeon: Maurilio Ship, MD;  Location: [facility];  Service: Gynecology;  Laterality: Bilateral;   TONSILLECTOMY  1971   TUBAL LIGATION     WH-MAMMOGRAPHY  11/24/2009    Family History  Problem Relation Age of Onset   Asthma Mother    Diabetes Mother    Hypertension Mother    Allergies Mother    Rheum arthritis Mother    Bladder Cancer Father 84       former heavy smoker   Hypertension Father    Stroke Father    Breast cancer Sister 62   Diabetes Sister        x 3   Allergies Sister    Asthma Sister        x 2    Breast cancer Maternal Aunt 59   Breast cancer Cousin 58       (maternal) bilateral cancer   Aneurysm Son 64   Diabetes Brother    Allergies Brother    Asthma Brother     Social History    Socioeconomic History   Marital status: Married    Spouse name: Not on file   Number of children: 1 D   Years of education: Not on file   Highest education level: Associate degree: occupational, Scientist, product/process development, or vocational program  Occupational History   Occupation: Retired  Tobacco Use   Smoking status: Former    Current packs/day: 0.00    Average packs/day: 1 pack/day for 1 year (1.0 ttl pk-yrs)    Types: Cigarettes    Start date: 02/11/1981    Quit date: 02/11/1982    Years since quitting: 41.8   Smokeless tobacco: Never  Vaping Use   Vaping status: Never Used  Substance and Sexual Activity   Alcohol use: No    Alcohol/week: 0.0 standard drinks of alcohol   Drug use: No   Sexual activity: Not on file  Other Topics Concern   Not on file  Social History Narrative   Left handed   Lives in single story home with husband   Social Drivers of Health   Financial Resource Strain: Low Risk  (11/22/2023)   Overall Financial Resource Strain (CARDIA)    Difficulty of Paying Living Expenses: Not hard at all  Food Insecurity: No Food Insecurity (11/22/2023)   Hunger Vital Sign    Worried About Running Out of Food in the Last Year: Never true    Ran Out of Food in the Last Year: Never true  Transportation Needs: No Transportation Needs (11/22/2023)   PRAPARE - Administrator, Civil Service (Medical): No    Lack of Transportation (Non-Medical): No  Physical Activity: Inactive (11/22/2023)   Exercise Vital Sign    Days of Exercise per Week: 0 days    Minutes of Exercise per Session: Not on file  Stress: No Stress Concern Present (11/22/2023)   [employer] of Occupational Health - Occupational Stress Questionnaire    Feeling of Stress: Not at all  Social Connections: Moderately Integrated (11/22/2023)   Social Connection and Isolation Panel    Frequency of Communication with Friends and Family: More than three times a week    Frequency of Social Gatherings with  Friends and Family: More than three times a week    Attends Religious Services: More than 4 times per year    Active Member of [employer] or Organizations: No    Attends [employer] Meetings: Not on file    Marital Status: Married  Intimate Partner Violence: Not At Risk (11/08/2023)   Received from  [facility]   HITS    Over the last 12 months how often did your partner physically hurt you?: Never    Over the last 12 months how often did your partner insult you or talk down to you?: Never    Over the last 12 months how often did your partner threaten you with physical harm?: Never    Over the last 12 months how often did your partner scream or curse at you?: Never    ROS      Objective    BP 138/85   Pulse 72   Ht 5' 1.5 (1.562 m)   Wt 154 lb 3.2 oz (69.9 kg)   SpO2 97%   BMI 28.66 kg/m   Physical Exam  {Labs (Optional):23779}    Assessment & Plan:   Encounter to establish care     No follow-ups on file.   Tanda Raguel SQUIBB, MD

## 2023-11-27 ENCOUNTER — Encounter: Payer: Self-pay | Admitting: Family Medicine

## 2023-11-28 NOTE — Progress Notes (Unsigned)
 Assessment/Plan:   1.  Restless leg secondary to iron deficiency anemia  -Continue rotigotine  patch, 4 mg daily.  -Patient following closely with Dr. Lonn   -will check ferritin/iron stores today 2.  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  Pancytopenia  -Chemotherapy-induced.  Medications being reduced because of this. 4.  History of small subdural hematoma in November, 2020 after a fall  -Nothing further to do.  Has seen Dr. Onetha. 5.  Chronic LBP  -handicap sticker filled out, although equally disabled from CA 6.  Chemotherapy-induced neuropathy  -On gabapentin , 900 mg twice per day.  Being managed by Dr. Lonn. 7.  Anosmia  -neuro exam is nonfocal and nonlateralizing  -we discussed neuroimaging but decided to hold for now.  Will let me know if sx's change/progress Subjective:   Olivia Benson was seen today in follow up for RLS.  My previous records as well as any outside records available were reviewed prior to todays visit.  Patient is seen on a yearly basis.  She takes the rotigotine  patch, 4 mg daily.  She continues to follow with Dr. Lonn as well.  She last saw her on September 2.  Patient's maintenance chemo has been reduced.  She does have chronic pancytopenia, but overall is doing well.  Her last CBC was about a month ago with her hemoglobin being 12.5.  Her platelets were 89, which has been fairly stable.  PREVIOUS MEDICATIONS: Neupro , 4mg   CURRENT MEDICATIONS:  Outpatient Encounter Medications as of 12/01/2023  Medication Sig   calcium  elemental as carbonate (BARIATRIC TUMS ULTRA) 400 MG chewable tablet Chew 1 tablet by mouth 2 (two) times daily.   Cholecalciferol  (VITAMIN D ) 50 MCG (2000 UT) tablet Take 2,000 Units by mouth at bedtime.   gabapentin  (NEURONTIN ) 300 MG capsule TAKE 3 CAPSULES BY MOUTH TWICE DAILY   HYDROcodone -acetaminophen  (NORCO/VICODIN) 5-325 MG tablet Take 1 tablet by mouth every 6 (six) hours as needed for moderate  pain (pain score 4-6).   insulin  glargine, 1 Unit Dial, (TOUJEO  SOLOSTAR) 300 UNIT/ML Solostar Pen Inject 30 Units into the skin daily.   lidocaine -prilocaine  (EMLA ) cream Apply to Porta-cath  1-2 hours prior to access as directed. (Patient taking differently: Apply 1 application  topically daily as needed (port access).)   niraparib  tosylate (ZEJULA ) 100 MG tablet Take 1 tablet (100 mg total) by mouth daily.   ondansetron  (ZOFRAN ) 8 MG tablet TAKE 1 TABLET(8 MG) BY MOUTH EVERY 8 HOURS AS NEEDED   OZEMPIC, 1 MG/DOSE, 4 MG/3ML SOPN Inject 1 mg into the skin once a week.   pantoprazole  (PROTONIX ) 40 MG tablet Take 1 tablet (40 mg total) by mouth daily.   polyethylene glycol (MIRALAX  / GLYCOLAX ) 17 g packet Take 17 g by mouth daily. (Patient not taking: Reported on 11/26/2023)   rotigotine  (NEUPRO ) 4 MG/24HR APPLY 1 PATCH TOPICALLY TO THE SKIN DAILY   senna (SENOKOT) 8.6 MG tablet Take 2 tablets by mouth 2 (two) times daily.   No facility-administered encounter medications on file as of 12/01/2023.     Objective:   PHYSICAL EXAMINATION:    VITALS:   There were no vitals filed for this visit.  GEN:  The patient appears stated age and is in NAD. HEENT:  Normocephalic, atraumatic.  The mucous membranes are moist. The superficial temporal arteries are without ropiness or tenderness. CV:  RRR Lungs:  CTAB Neck:  no bruits  Neurological examination:  Orientation: The patient is alert and oriented x3.  Cranial nerves: There is good facial symmetry.The speech is fluent and clear. Soft palate rises symmetrically and there is no tongue deviation. Hearing is intact to conversational tone. Sensation: Sensation is intact to light touch throughout Motor: Strength is at least antigravity x4.  Movement examination: Tone: There is normal tone in the UE/LE Abnormal movements:  no tremor.  No myoclonus.  No asterixis.   Coordination:  There is no decremation with RAM's. Gait and Station: The patient  pushes off to arise.  She is ambulating well.  She has good arm swing  I have reviewed and interpreted the following labs independently   Chemistry      Component Value Date/Time   NA 142 11/21/2023 0731   NA 142 02/06/2017 0742   K 3.8 11/21/2023 0731   K 3.9 02/06/2017 0742   CL 102 11/21/2023 0731   CO2 26 11/21/2023 0731   CO2 22 02/06/2017 0742   BUN 12 11/21/2023 0731   BUN 13.5 02/06/2017 0742   CREATININE 0.94 11/21/2023 0731   CREATININE 0.93 03/28/2022 0737   CREATININE 0.8 02/06/2017 0742      Component Value Date/Time   CALCIUM  8.7 (L) 11/21/2023 0731   CALCIUM  8.5 02/06/2017 0742   ALKPHOS 85 10/07/2023 0742   ALKPHOS 130 02/06/2017 0742   AST 25 10/07/2023 0742   AST 25 03/28/2022 0737   AST 47 (H) 02/06/2017 0742   ALT 19 10/07/2023 0742   ALT 22 03/28/2022 0737   ALT 54 02/06/2017 0742   BILITOT 0.7 10/07/2023 0742   BILITOT 0.7 03/28/2022 0737   BILITOT 0.65 02/06/2017 0742     Lab Results  Component Value Date   WBC 3.9 (L) 11/21/2023   HGB 13.0 11/21/2023   HCT 38.8 11/21/2023   MCV 96.3 11/21/2023   PLT 107 (L) 11/21/2023   Lab Results  Component Value Date   FERRITIN 39 11/28/2022     Cc:  Tanda Bleacher, MD

## 2023-12-01 ENCOUNTER — Ambulatory Visit: Payer: Medicare Other | Admitting: Neurology

## 2023-12-01 ENCOUNTER — Other Ambulatory Visit: Payer: Self-pay | Admitting: Hematology and Oncology

## 2023-12-01 VITALS — BP 126/80 | HR 74 | Ht 61.5 in | Wt 157.0 lb

## 2023-12-01 DIAGNOSIS — G620 Drug-induced polyneuropathy: Secondary | ICD-10-CM

## 2023-12-01 DIAGNOSIS — G2581 Restless legs syndrome: Secondary | ICD-10-CM

## 2023-12-01 DIAGNOSIS — T451X5A Adverse effect of antineoplastic and immunosuppressive drugs, initial encounter: Secondary | ICD-10-CM

## 2023-12-01 DIAGNOSIS — C482 Malignant neoplasm of peritoneum, unspecified: Secondary | ICD-10-CM

## 2023-12-01 MED ORDER — NEUPRO 4 MG/24HR TD PT24
MEDICATED_PATCH | TRANSDERMAL | 3 refills | Status: AC
Start: 2023-12-01 — End: ?

## 2023-12-02 ENCOUNTER — Inpatient Hospital Stay: Attending: Hematology and Oncology

## 2023-12-02 ENCOUNTER — Encounter: Payer: Self-pay | Admitting: Hematology and Oncology

## 2023-12-02 ENCOUNTER — Inpatient Hospital Stay (HOSPITAL_BASED_OUTPATIENT_CLINIC_OR_DEPARTMENT_OTHER): Admitting: Hematology and Oncology

## 2023-12-02 VITALS — BP 147/63 | HR 66 | Temp 97.6°F | Resp 18 | Ht 61.5 in | Wt 156.8 lb

## 2023-12-02 DIAGNOSIS — C482 Malignant neoplasm of peritoneum, unspecified: Secondary | ICD-10-CM | POA: Diagnosis present

## 2023-12-02 DIAGNOSIS — Z79899 Other long term (current) drug therapy: Secondary | ICD-10-CM | POA: Insufficient documentation

## 2023-12-02 DIAGNOSIS — D61818 Other pancytopenia: Secondary | ICD-10-CM | POA: Diagnosis not present

## 2023-12-02 DIAGNOSIS — G8929 Other chronic pain: Secondary | ICD-10-CM | POA: Diagnosis not present

## 2023-12-02 DIAGNOSIS — R971 Elevated cancer antigen 125 [CA 125]: Secondary | ICD-10-CM | POA: Diagnosis not present

## 2023-12-02 DIAGNOSIS — M545 Low back pain, unspecified: Secondary | ICD-10-CM | POA: Diagnosis not present

## 2023-12-02 LAB — COMPREHENSIVE METABOLIC PANEL WITH GFR
ALT: 15 U/L (ref 0–44)
AST: 22 U/L (ref 15–41)
Albumin: 4.1 g/dL (ref 3.5–5.0)
Alkaline Phosphatase: 76 U/L (ref 38–126)
Anion gap: 6 (ref 5–15)
BUN: 12 mg/dL (ref 8–23)
CO2: 28 mmol/L (ref 22–32)
Calcium: 9.6 mg/dL (ref 8.9–10.3)
Chloride: 108 mmol/L (ref 98–111)
Creatinine, Ser: 0.96 mg/dL (ref 0.44–1.00)
GFR, Estimated: >60 mL/min (ref >60)
Glucose, Bld: 82 mg/dL (ref 70–99)
Potassium: 4.2 mmol/L (ref 3.5–5.1)
Sodium: 142 mmol/L (ref 135–145)
Total Bilirubin: 0.6 mg/dL (ref 0.0–1.2)
Total Protein: 7.3 g/dL (ref 6.5–8.1)

## 2023-12-02 LAB — CBC WITH DIFFERENTIAL/PLATELET
Abs Immature Granulocytes: 0.01 K/uL (ref 0.00–0.07)
Basophils Absolute: 0 K/uL (ref 0.0–0.1)
Basophils Relative: 0 %
Eosinophils Absolute: 0.1 K/uL (ref 0.0–0.5)
Eosinophils Relative: 2 %
HCT: 35.2 % — ABNORMAL LOW (ref 36.0–46.0)
Hemoglobin: 12.2 g/dL (ref 12.0–15.0)
Immature Granulocytes: 0 %
Lymphocytes Relative: 23 %
Lymphs Abs: 0.8 K/uL (ref 0.7–4.0)
MCH: 32.6 pg (ref 26.0–34.0)
MCHC: 34.7 g/dL (ref 30.0–36.0)
MCV: 94.1 fL (ref 80.0–100.0)
Monocytes Absolute: 0.2 K/uL (ref 0.1–1.0)
Monocytes Relative: 6 %
Neutro Abs: 2.4 K/uL (ref 1.7–7.7)
Neutrophils Relative %: 69 %
Platelets: 110 K/uL — ABNORMAL LOW (ref 150–400)
RBC: 3.74 MIL/uL — ABNORMAL LOW (ref 3.87–5.11)
RDW: 13.6 % (ref 11.5–15.5)
WBC: 3.5 K/uL — ABNORMAL LOW (ref 4.0–10.5)
nRBC: 0 % (ref 0.0–0.2)

## 2023-12-02 MED ORDER — HYDROCODONE-ACETAMINOPHEN 5-325 MG PO TABS: 1.0000 | ORAL_TABLET | Freq: Four times a day (QID) | ORAL | 0 refills | Status: DC | PRN

## 2023-12-02 NOTE — Assessment & Plan Note (Addendum)
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 

## 2023-12-02 NOTE — Assessment & Plan Note (Addendum)
She has chronic lower back pain, stable on prescribed hydrocodone I refilled her prescription today

## 2023-12-02 NOTE — Assessment & Plan Note (Addendum)
 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 Had allergic reaction to carboplatin . 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  She has been placed on maintenance niraparib  since October 2023 with reasonable disease control Overall, she tolerated treatment well except for mild pancytopenia and recent weight loss and intermittent nausea Over the past year, she has very mild disease progression while on niraparib  but overall asymptomatic Despite elevated tumor markers, she had minimum progression of her peritoneal disease She is comfortable to continue on niraparib  for now I plan to repeat imaging study again in December

## 2023-12-02 NOTE — Progress Notes (Signed)
 [facility] OFFICE PROGRESS NOTE  Patient Care Team: Tanda Bleacher, MD as PCP - General (Family Medicine) Tat, Asberry RAMAN, DO as Consulting Physician (Neurology)  Assessment & Plan Primary peritoneal carcinomatosis [facility]) She was originally diagnosed in 2013, stage III disease with multiple recurrences, with persistent peritoneal disease Pathology: Serous, Negative genetics, ER positive Prior treatment: Had cardiomyopathy that resolved with Doxil . Had allergic reaction to carboplatin . 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  She has been placed on maintenance niraparib  since October 2023 with reasonable disease control Overall, she tolerated treatment well except for mild pancytopenia and recent weight loss and intermittent nausea Over the past year, she has very mild disease progression while on niraparib  but overall asymptomatic Despite elevated tumor markers, she had minimum progression of her peritoneal disease She is comfortable to continue on niraparib  for now I plan to repeat imaging study again in December Pancytopenia, acquired 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 She is not symptomatic I recommend we continue on reduced dose of niraparib  at 100 mg daily I will establish threshold for her to continue on treatment as long as her platelet count stays above 75,000 Chronic bilateral low back pain, unspecified whether sciatica present She has chronic lower back pain, stable on prescribed hydrocodone  I refilled her prescription today  Orders Placed This Encounter  Procedures   CT ABDOMEN PELVIS W CONTRAST    Standing Status:   Future    Expected Date:   01/19/2024    Expiration Date:   12/01/2024    Scheduling Instructions:     No need oral contrast    If indicated for the ordered procedure, I authorize the administration of contrast media per  Radiology protocol:   Yes    Does the patient have a contrast media/X-ray dye allergy?:   No    Preferred imaging location?:   [facility]    If indicated for the ordered procedure, I authorize the administration of oral contrast media per Radiology protocol:   No    Reason for no oral contrast::   No need oral contrast     [name], MD  INTERVAL HISTORY: she returns for surveillance follow-up for recurrent primary peritoneal carcinomatosis on maintenance treatment with niraparib  She tolerated recent treatment well 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

## 2023-12-03 LAB — CA 125: Cancer Antigen (CA) 125: 119 U/mL — ABNORMAL HIGH (ref 0.0–38.1)

## 2023-12-11 ENCOUNTER — Other Ambulatory Visit (HOSPITAL_COMMUNITY): Payer: Self-pay

## 2023-12-12 ENCOUNTER — Other Ambulatory Visit: Payer: Self-pay

## 2023-12-12 ENCOUNTER — Encounter (INDEPENDENT_AMBULATORY_CARE_PROVIDER_SITE_OTHER): Payer: Self-pay

## 2023-12-12 NOTE — Progress Notes (Signed)
 Specialty Pharmacy Refill Coordination Note  Olivia Benson is a 78 y.o. female contacted today regarding refills of specialty medication(s) Niraparib  Tosylate (ZEJULA )   Patient requested Delivery   Delivery date: 12/16/23   Verified address: [address] [place], [place]. [address]   Medication will be filled on: 12/15/23

## 2023-12-15 ENCOUNTER — Other Ambulatory Visit: Payer: Self-pay

## 2024-01-01 ENCOUNTER — Other Ambulatory Visit: Payer: Self-pay | Admitting: Hematology and Oncology

## 2024-01-06 ENCOUNTER — Other Ambulatory Visit: Payer: Self-pay

## 2024-01-06 NOTE — Progress Notes (Signed)
 Specialty Pharmacy Refill Coordination Note  Olivia Benson is a 78 y.o. female contacted today regarding refills of specialty medication(s) Niraparib  Tosylate (ZEJULA )   Patient requested Delivery   Delivery date: 01/13/24   Verified address: [address], [place], [place]. [address]   Medication will be filled on: 01/12/24

## 2024-01-06 NOTE — Progress Notes (Signed)
 Specialty Pharmacy Ongoing Clinical Assessment Note  Olivia Benson is a 78 y.o. female who is being followed by the specialty pharmacy service for RxSp Oncology   Patient's specialty medication(s) reviewed today: No data recorded  Missed doses in the last 4 weeks: No data recorded  Patient/Caregiver did not have any additional questions or concerns.   Therapeutic benefit summary: Patient is achieving benefit (some benefit, but disease is slowly progressing)   Adverse events/side effects summary: No adverse events/side effects   Patient's therapy is appropriate to: Continue    Goals Addressed             This Visit's Progress    Slow Disease Progression   Worsening    Patient is not on track and worsening. Patient will maintain adherence, be monitored by provider to determine if a change in treatment plan is warranted, and be evaluated at upcoming provider appointment to assess progress. Per provider notes from 12/02/23, over the past year, she has very mild disease progression while on niraparib  but overall asymptomatic; despite elevated tumor markers, she had minimum progression of her peritoneal disease; she is comfortable to continue on niraparib  for now.  Dr. Lonn plans to repeat imaging study in December.          Follow up: 6 months  Silvano LOISE Dolly Specialty Pharmacist

## 2024-01-12 ENCOUNTER — Other Ambulatory Visit: Payer: Self-pay

## 2024-01-19 ENCOUNTER — Ambulatory Visit (HOSPITAL_COMMUNITY)
Admission: RE | Admit: 2024-01-19 | Discharge: 2024-01-19 | Disposition: A | Source: Ambulatory Visit | Attending: Hematology and Oncology

## 2024-01-19 ENCOUNTER — Inpatient Hospital Stay: Attending: Hematology and Oncology

## 2024-01-19 DIAGNOSIS — C482 Malignant neoplasm of peritoneum, unspecified: Secondary | ICD-10-CM

## 2024-01-19 DIAGNOSIS — D61818 Other pancytopenia: Secondary | ICD-10-CM | POA: Diagnosis not present

## 2024-01-19 DIAGNOSIS — K7460 Unspecified cirrhosis of liver: Secondary | ICD-10-CM | POA: Diagnosis not present

## 2024-01-19 DIAGNOSIS — I700 Atherosclerosis of aorta: Secondary | ICD-10-CM | POA: Insufficient documentation

## 2024-01-19 LAB — CBC WITH DIFFERENTIAL/PLATELET
Abs Immature Granulocytes: 0.01 K/uL (ref 0.00–0.07)
Basophils Absolute: 0 K/uL (ref 0.0–0.1)
Basophils Relative: 0 %
Eosinophils Absolute: 0.1 K/uL (ref 0.0–0.5)
Eosinophils Relative: 2 %
HCT: 37.2 % (ref 36.0–46.0)
Hemoglobin: 12.5 g/dL (ref 12.0–15.0)
Immature Granulocytes: 0 %
Lymphocytes Relative: 23 %
Lymphs Abs: 0.9 K/uL (ref 0.7–4.0)
MCH: 31.7 pg (ref 26.0–34.0)
MCHC: 33.6 g/dL (ref 30.0–36.0)
MCV: 94.4 fL (ref 80.0–100.0)
Monocytes Absolute: 0.2 K/uL (ref 0.1–1.0)
Monocytes Relative: 6 %
Neutro Abs: 2.6 K/uL (ref 1.7–7.7)
Neutrophils Relative %: 69 %
Platelets: 98 K/uL — ABNORMAL LOW (ref 150–400)
RBC: 3.94 MIL/uL (ref 3.87–5.11)
RDW: 13.5 % (ref 11.5–15.5)
WBC: 3.8 K/uL — ABNORMAL LOW (ref 4.0–10.5)
nRBC: 0 % (ref 0.0–0.2)

## 2024-01-19 LAB — COMPREHENSIVE METABOLIC PANEL WITH GFR
ALT: 20 U/L (ref 0–44)
AST: 29 U/L (ref 15–41)
Albumin: 4.4 g/dL (ref 3.5–5.0)
Alkaline Phosphatase: 87 U/L (ref 38–126)
Anion gap: 9 (ref 5–15)
BUN: 19 mg/dL (ref 8–23)
CO2: 26 mmol/L (ref 22–32)
Calcium: 9.3 mg/dL (ref 8.9–10.3)
Chloride: 106 mmol/L (ref 98–111)
Creatinine, Ser: 0.85 mg/dL (ref 0.44–1.00)
GFR, Estimated: >60 mL/min (ref >60)
Glucose, Bld: 98 mg/dL (ref 70–99)
Potassium: 4.2 mmol/L (ref 3.5–5.1)
Sodium: 141 mmol/L (ref 135–145)
Total Bilirubin: 0.6 mg/dL (ref 0.0–1.2)
Total Protein: 7.6 g/dL (ref 6.5–8.1)

## 2024-01-19 MED ORDER — IOHEXOL 300 MG/ML  SOLN
100.0000 mL | Freq: Once | INTRAMUSCULAR | Status: AC | PRN
  Administered 2024-01-19: 100 mL via INTRAVENOUS

## 2024-01-19 MED ORDER — HEPARIN SOD (PORK) LOCK FLUSH 100 UNIT/ML IV SOLN
INTRAVENOUS | Status: AC
  Filled 2024-01-19: qty 5

## 2024-01-19 MED ORDER — HEPARIN SOD (PORK) LOCK FLUSH 100 UNIT/ML IV SOLN
500.0000 [IU] | Freq: Once | INTRAVENOUS | Status: AC
  Administered 2024-01-19: 500 [IU] via INTRAVENOUS

## 2024-01-19 MED ORDER — SODIUM CHLORIDE (PF) 0.9 % IJ SOLN
INTRAMUSCULAR | Status: AC
  Filled 2024-01-19: qty 50

## 2024-01-20 LAB — CA 125: Cancer Antigen (CA) 125: 117 U/mL — ABNORMAL HIGH (ref 0.0–38.1)

## 2024-01-26 ENCOUNTER — Inpatient Hospital Stay: Admitting: Hematology and Oncology

## 2024-01-26 ENCOUNTER — Other Ambulatory Visit: Payer: Self-pay | Admitting: Hematology and Oncology

## 2024-01-26 ENCOUNTER — Encounter: Payer: Self-pay | Admitting: Hematology and Oncology

## 2024-01-26 VITALS — BP 141/69 | HR 73 | Temp 97.8°F | Resp 18 | Ht 61.5 in | Wt 151.0 lb

## 2024-01-26 DIAGNOSIS — C482 Malignant neoplasm of peritoneum, unspecified: Secondary | ICD-10-CM | POA: Diagnosis not present

## 2024-01-26 DIAGNOSIS — D61818 Other pancytopenia: Secondary | ICD-10-CM

## 2024-01-26 MED ORDER — HYDROCODONE-ACETAMINOPHEN 5-325 MG PO TABS: 1.0000 | ORAL_TABLET | Freq: Four times a day (QID) | ORAL | 0 refills | Status: AC | PRN

## 2024-01-26 NOTE — Assessment & Plan Note (Addendum)
 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 Had allergic reaction to carboplatin . 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  She has been placed on maintenance niraparib  since October 2023 with reasonable disease control Overall, she tolerated treatment well except for mild pancytopenia and recent weight loss and intermittent nausea Over the past year, she has very mild disease progression while on niraparib  but overall asymptomatic Despite elevated tumor markers, she had minimum progression of her peritoneal disease Overall, we discussed again the risk and benefits of pursuing chemotherapy versus continuing on olaparib She is comfortable to continue on niraparib  for now I plan to repeat imaging study again in March 2026

## 2024-01-26 NOTE — Assessment & Plan Note (Addendum)
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 

## 2024-01-26 NOTE — Progress Notes (Signed)
 [facility] OFFICE PROGRESS NOTE  Patient Care Team: Tanda Bleacher, MD as PCP - General (Family Medicine) Tat, Asberry RAMAN, DO as Consulting Physician (Neurology)  Assessment & Plan Primary peritoneal carcinomatosis [facility]) She was originally diagnosed in 2013, stage III disease with multiple recurrences, with persistent peritoneal disease Pathology: Serous, Negative genetics, ER positive Prior treatment: Had cardiomyopathy that resolved with Doxil . Had allergic reaction to carboplatin . 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  She has been placed on maintenance niraparib  since October 2023 with reasonable disease control Overall, she tolerated treatment well except for mild pancytopenia and recent weight loss and intermittent nausea Over the past year, she has very mild disease progression while on niraparib  but overall asymptomatic Despite elevated tumor markers, she had minimum progression of her peritoneal disease Overall, we discussed again the risk and benefits of pursuing chemotherapy versus continuing on olaparib She is comfortable to continue on niraparib  for now I plan to repeat imaging study again in March 2026   Pancytopenia, acquired [facility]) 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    Olivia Bedford, MD  INTERVAL HISTORY: she returns for treatment follow-up Complications related to previous cycle of chemotherapy included pancytopenia,  PHYSICAL EXAMINATION: ECOG PERFORMANCE STATUS: 0 - Asymptomatic  Lab Results  Component Value Date   CAN125 117.0 (H) 01/19/2024   CAN125 119.0 (H)  12/02/2023   CAN125 114.0 (H) 10/07/2023      Latest Ref Rng & Units 01/19/2024    7:40 AM 12/02/2023    8:31 AM 11/21/2023    7:31 AM  CBC  WBC 4.0 - 10.5 K/uL 3.8  3.5  3.9   Hemoglobin 12.0 - 15.0 g/dL 87.4  87.7  86.9   Hematocrit 36.0 - 46.0 % 37.2  35.2  38.8   Platelets 150 - 400 K/uL 98  110  107       Chemistry      Component Value Date/Time   NA 141 01/19/2024 0740   NA 142 02/06/2017 0742   K 4.2 01/19/2024 0740   K 3.9 02/06/2017 0742   CL 106 01/19/2024 0740   CO2 26 01/19/2024 0740   CO2 22 02/06/2017 0742   BUN 19 01/19/2024 0740   BUN 13.5 02/06/2017 0742   CREATININE 0.85 01/19/2024 0740   CREATININE 0.93 03/28/2022 0737   CREATININE 0.8 02/06/2017 0742      Component Value Date/Time   CALCIUM  9.3 01/19/2024 0740   CALCIUM  8.5 02/06/2017 0742   ALKPHOS 87 01/19/2024 0740   ALKPHOS 130 02/06/2017 0742   AST 29 01/19/2024 0740   AST 25 03/28/2022 0737   AST 47 (H) 02/06/2017 0742   ALT 20 01/19/2024 0740   ALT 22 03/28/2022 0737   ALT 54 02/06/2017 0742   BILITOT 0.6 01/19/2024 0740   BILITOT 0.7 03/28/2022 0737   BILITOT 0.65 02/06/2017 0742       Vitals:   01/26/24 0844  BP: (!) 141/69  Pulse: 73  Resp: 18  Temp: 97.8 F (36.6 C)  SpO2: 100%   Filed Weights   01/26/24 0844  Weight: 151 lb (68.5 kg)  Other relevant data reviewed during this visit included CBC, CMP, CA125, CT imaging from December 2025

## 2024-02-04 ENCOUNTER — Other Ambulatory Visit (HOSPITAL_COMMUNITY): Payer: Self-pay

## 2024-02-10 ENCOUNTER — Encounter: Payer: Self-pay | Admitting: Hematology and Oncology

## 2024-02-10 ENCOUNTER — Other Ambulatory Visit (HOSPITAL_COMMUNITY): Payer: Self-pay

## 2024-02-10 ENCOUNTER — Other Ambulatory Visit: Payer: Self-pay

## 2024-02-11 ENCOUNTER — Other Ambulatory Visit: Payer: Self-pay

## 2024-02-11 NOTE — Progress Notes (Signed)
 Specialty Pharmacy Refill Coordination Note  [name] is a 78 y.o. female contacted today regarding refills of specialty medication(s) Niraparib  Tosylate (ZEJULA )   Patient requested Delivery   Delivery date: 02/13/24   Verified address: [address], [place], [place]. [address]   Medication will be filled on: 02/11/24

## 2024-03-03 ENCOUNTER — Other Ambulatory Visit: Payer: Self-pay

## 2024-03-08 ENCOUNTER — Telehealth: Payer: Self-pay | Admitting: Hematology and Oncology

## 2024-03-08 NOTE — Telephone Encounter (Signed)
 Rescheduled appointments due to cancer center opening up at 10a. Talked with the patient and she is aware of the changes made to her upcoming appointments.

## 2024-03-09 ENCOUNTER — Inpatient Hospital Stay: Admitting: Hematology and Oncology

## 2024-03-09 ENCOUNTER — Inpatient Hospital Stay

## 2024-03-10 ENCOUNTER — Other Ambulatory Visit (HOSPITAL_COMMUNITY): Payer: Self-pay

## 2024-03-11 ENCOUNTER — Other Ambulatory Visit (HOSPITAL_COMMUNITY): Payer: Self-pay

## 2024-03-11 ENCOUNTER — Other Ambulatory Visit: Payer: Self-pay

## 2024-03-11 ENCOUNTER — Encounter: Payer: Self-pay | Admitting: Hematology and Oncology

## 2024-03-11 ENCOUNTER — Telehealth: Payer: Self-pay

## 2024-03-11 NOTE — Progress Notes (Unsigned)
 Benefits Investigation Started  Reason: Filled After Coverage Expired Tito)  Routed to: [employer]

## 2024-03-11 NOTE — Telephone Encounter (Addendum)
 Oral Oncology Patient Advocate Encounter  Was successful in securing patient a $3500 grant from [facility] to provide copayment coverage for Zejula .  This will keep the out of pocket expense at $0.    *Pending Diagnosis  Re-Verification, additional form  faxed in 03/11/2024 after diagnosis denial *03-15-2024 Update: [employer] has received  additional form, it is currently under review   [employer] ID: [id number]   The billing information is as follows and has been shared with [facility].    RxBin: [id number] PCN: [id number] Member ID: [id number] Group ID: [id number] Dates of Eligibility: 11/30/025 through 01/09/2025  Fund:  Ovarian Cancer - Medicare Access   Charlott Hamilton,  CPhT-Adv  she/her/hers [facility]  [facility] Specialty Pharmacy Services Pharmacy Technician Patient Advocate Specialist III [facility] Phone: [phone]  Fax: [phone] [email or web address]

## 2024-03-12 ENCOUNTER — Other Ambulatory Visit: Payer: Self-pay

## 2024-03-15 ENCOUNTER — Other Ambulatory Visit (HOSPITAL_COMMUNITY): Payer: Self-pay

## 2024-03-17 ENCOUNTER — Other Ambulatory Visit (HOSPITAL_COMMUNITY): Payer: Self-pay

## 2024-03-18 ENCOUNTER — Other Ambulatory Visit: Payer: Self-pay

## 2024-03-19 ENCOUNTER — Other Ambulatory Visit: Payer: Self-pay

## 2024-03-25 ENCOUNTER — Inpatient Hospital Stay: Admitting: Hematology and Oncology

## 2024-03-25 ENCOUNTER — Inpatient Hospital Stay: Attending: Hematology and Oncology

## 2024-11-30 ENCOUNTER — Ambulatory Visit: Admitting: Neurology

## 2024-12-01 ENCOUNTER — Ambulatory Visit: Admitting: Family Medicine
